# Patient Record
Sex: Male | Born: 1950 | Race: White | Hispanic: No | Marital: Married | Smoking: Former smoker
Health system: Southern US, Community
[De-identification: ages and names within clinical notes are randomized; demographics above are authoritative.]

## PROBLEM LIST (undated history)

## (undated) DIAGNOSIS — D649 Anemia, unspecified: Secondary | ICD-10-CM

## (undated) DIAGNOSIS — Z7189 Other specified counseling: Secondary | ICD-10-CM

## (undated) DIAGNOSIS — I4819 Other persistent atrial fibrillation: Secondary | ICD-10-CM

## (undated) DIAGNOSIS — I10 Essential (primary) hypertension: Secondary | ICD-10-CM

## (undated) DIAGNOSIS — R739 Hyperglycemia, unspecified: Secondary | ICD-10-CM

## (undated) DIAGNOSIS — R0600 Dyspnea, unspecified: Secondary | ICD-10-CM

## (undated) DIAGNOSIS — Z8546 Personal history of malignant neoplasm of prostate: Secondary | ICD-10-CM

## (undated) DIAGNOSIS — D494 Neoplasm of unspecified behavior of bladder: Secondary | ICD-10-CM

## (undated) DIAGNOSIS — Z973 Presence of spectacles and contact lenses: Secondary | ICD-10-CM

## (undated) DIAGNOSIS — C679 Malignant neoplasm of bladder, unspecified: Secondary | ICD-10-CM

## (undated) DIAGNOSIS — E785 Hyperlipidemia, unspecified: Secondary | ICD-10-CM

## (undated) DIAGNOSIS — R7303 Prediabetes: Secondary | ICD-10-CM

## (undated) DIAGNOSIS — M1990 Unspecified osteoarthritis, unspecified site: Secondary | ICD-10-CM

## (undated) DIAGNOSIS — R399 Unspecified symptoms and signs involving the genitourinary system: Secondary | ICD-10-CM

## (undated) DIAGNOSIS — C772 Secondary and unspecified malignant neoplasm of intra-abdominal lymph nodes: Secondary | ICD-10-CM

## (undated) DIAGNOSIS — I499 Cardiac arrhythmia, unspecified: Secondary | ICD-10-CM

## (undated) DIAGNOSIS — N189 Chronic kidney disease, unspecified: Secondary | ICD-10-CM

## (undated) HISTORY — PX: LUMBAR SPINE SURGERY: SHX701

## (undated) HISTORY — DX: Malignant neoplasm of bladder, unspecified: C67.9

## (undated) HISTORY — DX: Other persistent atrial fibrillation: I48.19

## (undated) HISTORY — PX: TOTAL KNEE ARTHROPLASTY: SHX125

## (undated) HISTORY — PX: KNEE ARTHROSCOPY: SUR90

## (undated) HISTORY — DX: Chronic kidney disease, unspecified: N18.9

## (undated) HISTORY — DX: Secondary and unspecified malignant neoplasm of intra-abdominal lymph nodes: C77.2

## (undated) HISTORY — DX: Other specified counseling: Z71.89

---
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
## 1995-04-13 HISTORY — PX: CERVICAL SPINE SURGERY: SHX589

## 1997-09-27 ENCOUNTER — Ambulatory Visit (HOSPITAL_COMMUNITY): Admission: RE | Admit: 1997-09-27 | Discharge: 1997-09-28 | Payer: Self-pay | Admitting: Neurosurgery

## 2002-02-27 ENCOUNTER — Encounter: Admission: RE | Admit: 2002-02-27 | Discharge: 2002-02-27 | Payer: Self-pay | Admitting: Neurosurgery

## 2002-02-27 ENCOUNTER — Encounter: Payer: Self-pay | Admitting: Neurosurgery

## 2002-03-16 ENCOUNTER — Encounter: Payer: Self-pay | Admitting: Neurosurgery

## 2002-03-16 ENCOUNTER — Encounter: Admission: RE | Admit: 2002-03-16 | Discharge: 2002-03-16 | Payer: Self-pay | Admitting: Neurosurgery

## 2002-04-04 ENCOUNTER — Encounter: Payer: Self-pay | Admitting: Neurosurgery

## 2002-04-04 ENCOUNTER — Encounter: Admission: RE | Admit: 2002-04-04 | Discharge: 2002-04-04 | Payer: Self-pay | Admitting: Neurosurgery

## 2006-02-02 ENCOUNTER — Ambulatory Visit (HOSPITAL_COMMUNITY): Admission: RE | Admit: 2006-02-02 | Discharge: 2006-02-02 | Payer: Self-pay | Admitting: Urology

## 2006-06-20 ENCOUNTER — Observation Stay (HOSPITAL_COMMUNITY): Admission: RE | Admit: 2006-06-20 | Discharge: 2006-06-21 | Payer: Self-pay | Admitting: Urology

## 2006-06-20 ENCOUNTER — Encounter (INDEPENDENT_AMBULATORY_CARE_PROVIDER_SITE_OTHER): Payer: Self-pay | Admitting: Specialist

## 2006-06-20 HISTORY — PX: ROBOT ASSISTED LAPAROSCOPIC RADICAL PROSTATECTOMY: SHX5141

## 2007-02-15 ENCOUNTER — Ambulatory Visit (HOSPITAL_BASED_OUTPATIENT_CLINIC_OR_DEPARTMENT_OTHER): Admission: RE | Admit: 2007-02-15 | Discharge: 2007-02-15 | Payer: Self-pay | Admitting: Orthopedic Surgery

## 2009-04-12 HISTORY — PX: CATARACT EXTRACTION W/ INTRAOCULAR LENS  IMPLANT, BILATERAL: SHX1307

## 2009-08-13 ENCOUNTER — Inpatient Hospital Stay (HOSPITAL_COMMUNITY): Admission: RE | Admit: 2009-08-13 | Discharge: 2009-08-15 | Payer: Self-pay | Admitting: Orthopedic Surgery

## 2009-12-26 ENCOUNTER — Inpatient Hospital Stay (HOSPITAL_COMMUNITY): Admission: RE | Admit: 2009-12-26 | Discharge: 2009-12-28 | Payer: Self-pay | Admitting: Orthopedic Surgery

## 2010-01-31 ENCOUNTER — Emergency Department (HOSPITAL_COMMUNITY): Admission: EM | Admit: 2010-01-31 | Discharge: 2010-01-31 | Payer: Self-pay | Admitting: Emergency Medicine

## 2010-06-24 LAB — LIPASE, BLOOD: Lipase: 21 U/L (ref 11–59)

## 2010-06-24 LAB — URINALYSIS, ROUTINE W REFLEX MICROSCOPIC
Bilirubin Urine: NEGATIVE
Glucose, UA: NEGATIVE mg/dL
Ketones, ur: NEGATIVE mg/dL
Leukocytes, UA: NEGATIVE
Nitrite: NEGATIVE
Protein, ur: NEGATIVE mg/dL
Specific Gravity, Urine: 1.023 (ref 1.005–1.030)
Urobilinogen, UA: 0.2 mg/dL (ref 0.0–1.0)
pH: 6.5 (ref 5.0–8.0)

## 2010-06-24 LAB — COMPREHENSIVE METABOLIC PANEL
ALT: 15 U/L (ref 0–53)
AST: 24 U/L (ref 0–37)
Albumin: 3.7 g/dL (ref 3.5–5.2)
Alkaline Phosphatase: 65 U/L (ref 39–117)
BUN: 20 mg/dL (ref 6–23)
CO2: 29 mEq/L (ref 19–32)
Calcium: 9.3 mg/dL (ref 8.4–10.5)
Chloride: 104 mEq/L (ref 96–112)
Creatinine, Ser: 1.3 mg/dL (ref 0.4–1.5)
GFR calc Af Amer: >60 mL/min (ref >60)
GFR calc non Af Amer: 57 mL/min — ABNORMAL LOW (ref >60)
Glucose, Bld: 129 mg/dL — ABNORMAL HIGH (ref 70–99)
Potassium: 4.1 mEq/L (ref 3.5–5.1)
Sodium: 141 mEq/L (ref 135–145)
Total Bilirubin: 0.7 mg/dL (ref 0.3–1.2)
Total Protein: 6.4 g/dL (ref 6.0–8.3)

## 2010-06-24 LAB — CBC
HCT: 37.3 % — ABNORMAL LOW (ref 39.0–52.0)
Hemoglobin: 12.1 g/dL — ABNORMAL LOW (ref 13.0–17.0)
MCH: 27 pg (ref 26.0–34.0)
MCHC: 32.4 g/dL (ref 30.0–36.0)
MCV: 83.3 fL (ref 78.0–100.0)
Platelets: 248 10*3/uL (ref 150–400)
RBC: 4.48 MIL/uL (ref 4.22–5.81)
RDW: 13.3 % (ref 11.5–15.5)
WBC: 10.7 10*3/uL — ABNORMAL HIGH (ref 4.0–10.5)

## 2010-06-24 LAB — DIFFERENTIAL
Basophils Absolute: 0 10*3/uL (ref 0.0–0.1)
Basophils Relative: 0 % (ref 0–1)
Eosinophils Absolute: 0 10*3/uL (ref 0.0–0.7)
Eosinophils Relative: 0 % (ref 0–5)
Lymphocytes Relative: 11 % — ABNORMAL LOW (ref 12–46)
Lymphs Abs: 1.2 10*3/uL (ref 0.7–4.0)
Monocytes Absolute: 0.6 10*3/uL (ref 0.1–1.0)
Monocytes Relative: 6 % (ref 3–12)
Neutro Abs: 8.8 10*3/uL — ABNORMAL HIGH (ref 1.7–7.7)
Neutrophils Relative %: 83 % — ABNORMAL HIGH (ref 43–77)

## 2010-06-24 LAB — PROTIME-INR
INR: 0.96 (ref 0.00–1.49)
Prothrombin Time: 13 seconds (ref 11.6–15.2)

## 2010-06-24 LAB — URINE MICROSCOPIC-ADD ON

## 2010-06-25 LAB — URINE MICROSCOPIC-ADD ON

## 2010-06-25 LAB — CBC
HCT: 28.9 % — ABNORMAL LOW (ref 39.0–52.0)
HCT: 37 % — ABNORMAL LOW (ref 39.0–52.0)
HCT: 48.1 % (ref 39.0–52.0)
Hemoglobin: 11.8 g/dL — ABNORMAL LOW (ref 13.0–17.0)
Hemoglobin: 9.4 g/dL — ABNORMAL LOW (ref 13.0–17.0)
Hemoglobin: 16.2 g/dL (ref 13.0–17.0)
MCH: 27.2 pg (ref 26.0–34.0)
MCH: 27.6 pg (ref 26.0–34.0)
MCH: 28 pg (ref 26.0–34.0)
MCHC: 31.9 g/dL (ref 30.0–36.0)
MCHC: 32.5 g/dL (ref 30.0–36.0)
MCHC: 33.7 g/dL (ref 30.0–36.0)
MCV: 83.5 fL (ref 78.0–100.0)
MCV: 86.7 fL (ref 78.0–100.0)
MCV: 83.2 fL (ref 78.0–100.0)
Platelets: 156 10*3/uL (ref 150–400)
Platelets: 228 10*3/uL (ref 150–400)
Platelets: 205 10*3/uL (ref 150–400)
RBC: 3.46 MIL/uL — ABNORMAL LOW (ref 4.22–5.81)
RBC: 4.27 MIL/uL (ref 4.22–5.81)
RBC: 5.78 MIL/uL (ref 4.22–5.81)
RDW: 13.9 % (ref 11.5–15.5)
RDW: 14.2 % (ref 11.5–15.5)
RDW: 13.4 % (ref 11.5–15.5)
WBC: 10.4 10*3/uL (ref 4.0–10.5)
WBC: 13 10*3/uL — ABNORMAL HIGH (ref 4.0–10.5)
WBC: 8.8 10*3/uL (ref 4.0–10.5)

## 2010-06-25 LAB — BASIC METABOLIC PANEL
BUN: 13 mg/dL (ref 6–23)
BUN: 25 mg/dL — ABNORMAL HIGH (ref 6–23)
CO2: 30 mEq/L (ref 19–32)
CO2: 32 mEq/L (ref 19–32)
Calcium: 7.9 mg/dL — ABNORMAL LOW (ref 8.4–10.5)
Calcium: 8.4 mg/dL (ref 8.4–10.5)
Chloride: 100 mEq/L (ref 96–112)
Chloride: 99 mEq/L (ref 96–112)
Creatinine, Ser: 0.96 mg/dL (ref 0.4–1.5)
Creatinine, Ser: 2.97 mg/dL — ABNORMAL HIGH (ref 0.4–1.5)
GFR calc Af Amer: 26 mL/min — ABNORMAL LOW (ref >60)
GFR calc Af Amer: >60 mL/min (ref >60)
GFR calc non Af Amer: 22 mL/min — ABNORMAL LOW (ref >60)
GFR calc non Af Amer: >60 mL/min (ref >60)
Glucose, Bld: 153 mg/dL — ABNORMAL HIGH (ref 70–99)
Glucose, Bld: 162 mg/dL — ABNORMAL HIGH (ref 70–99)
Potassium: 3.9 mEq/L (ref 3.5–5.1)
Potassium: 4.7 mEq/L (ref 3.5–5.1)
Sodium: 135 mEq/L (ref 135–145)
Sodium: 136 mEq/L (ref 135–145)

## 2010-06-25 LAB — COMPREHENSIVE METABOLIC PANEL
ALT: 19 U/L (ref 0–53)
AST: 26 U/L (ref 0–37)
Albumin: 4.3 g/dL (ref 3.5–5.2)
Alkaline Phosphatase: 73 U/L (ref 39–117)
BUN: 18 mg/dL (ref 6–23)
CO2: 31 mEq/L (ref 19–32)
Calcium: 9.5 mg/dL (ref 8.4–10.5)
Chloride: 104 mEq/L (ref 96–112)
Creatinine, Ser: 1.02 mg/dL (ref 0.4–1.5)
GFR calc Af Amer: >60 mL/min (ref >60)
GFR calc non Af Amer: >60 mL/min (ref >60)
Glucose, Bld: 101 mg/dL — ABNORMAL HIGH (ref 70–99)
Potassium: 4.9 mEq/L (ref 3.5–5.1)
Sodium: 140 mEq/L (ref 135–145)
Total Bilirubin: 0.9 mg/dL (ref 0.3–1.2)
Total Protein: 6.9 g/dL (ref 6.0–8.3)

## 2010-06-25 LAB — DIFFERENTIAL
Basophils Absolute: 0.1 10*3/uL (ref 0.0–0.1)
Basophils Relative: 1 % (ref 0–1)
Eosinophils Absolute: 0.2 10*3/uL (ref 0.0–0.7)
Eosinophils Relative: 2 % (ref 0–5)
Lymphocytes Relative: 28 % (ref 12–46)
Lymphs Abs: 2.4 10*3/uL (ref 0.7–4.0)
Monocytes Absolute: 0.6 10*3/uL (ref 0.1–1.0)
Monocytes Relative: 7 % (ref 3–12)
Neutro Abs: 5.5 10*3/uL (ref 1.7–7.7)
Neutrophils Relative %: 62 % (ref 43–77)

## 2010-06-25 LAB — URINALYSIS, ROUTINE W REFLEX MICROSCOPIC
Bilirubin Urine: NEGATIVE
Glucose, UA: NEGATIVE mg/dL
Hgb urine dipstick: NEGATIVE
Ketones, ur: NEGATIVE mg/dL
Nitrite: NEGATIVE
Protein, ur: NEGATIVE mg/dL
Specific Gravity, Urine: 1.024 (ref 1.005–1.030)
Urobilinogen, UA: 0.2 mg/dL (ref 0.0–1.0)
pH: 6 (ref 5.0–8.0)

## 2010-06-25 LAB — APTT: aPTT: 28 seconds (ref 24–37)

## 2010-06-25 LAB — TYPE AND SCREEN
ABO/RH(D): A POS
Antibody Screen: NEGATIVE

## 2010-06-25 LAB — SURGICAL PCR SCREEN
MRSA, PCR: NEGATIVE
Staphylococcus aureus: NEGATIVE

## 2010-06-25 LAB — PROTIME-INR
INR: 1.09 (ref 0.00–1.49)
INR: 1.31 (ref 0.00–1.49)
INR: 0.97 (ref 0.00–1.49)
Prothrombin Time: 14.3 seconds (ref 11.6–15.2)
Prothrombin Time: 16.5 seconds — ABNORMAL HIGH (ref 11.6–15.2)
Prothrombin Time: 13.1 seconds (ref 11.6–15.2)

## 2010-06-25 LAB — CK: Total CK: 93 U/L (ref 7–232)

## 2010-06-30 LAB — DIFFERENTIAL
Basophils Absolute: 0.1 10*3/uL (ref 0.0–0.1)
Basophils Relative: 1 % (ref 0–1)
Eosinophils Absolute: 0.2 10*3/uL (ref 0.0–0.7)
Eosinophils Relative: 3 % (ref 0–5)
Lymphocytes Relative: 32 % (ref 12–46)
Lymphs Abs: 2.4 10*3/uL (ref 0.7–4.0)
Monocytes Absolute: 0.7 10*3/uL (ref 0.1–1.0)
Monocytes Relative: 10 % (ref 3–12)
Neutro Abs: 4.2 10*3/uL (ref 1.7–7.7)
Neutrophils Relative %: 55 % (ref 43–77)

## 2010-06-30 LAB — CBC
HCT: 28.8 % — ABNORMAL LOW (ref 39.0–52.0)
HCT: 32.9 % — ABNORMAL LOW (ref 39.0–52.0)
HCT: 44.4 % (ref 39.0–52.0)
Hemoglobin: 11.6 g/dL — ABNORMAL LOW (ref 13.0–17.0)
Hemoglobin: 9.9 g/dL — ABNORMAL LOW (ref 13.0–17.0)
Hemoglobin: 15.3 g/dL (ref 13.0–17.0)
MCHC: 34.2 g/dL (ref 30.0–36.0)
MCHC: 35.2 g/dL (ref 30.0–36.0)
MCHC: 34.5 g/dL (ref 30.0–36.0)
MCV: 88.1 fL (ref 78.0–100.0)
MCV: 88.2 fL (ref 78.0–100.0)
MCV: 88.4 fL (ref 78.0–100.0)
Platelets: 147 10*3/uL — ABNORMAL LOW (ref 150–400)
Platelets: 187 10*3/uL (ref 150–400)
Platelets: 211 10*3/uL (ref 150–400)
RBC: 3.27 MIL/uL — ABNORMAL LOW (ref 4.22–5.81)
RBC: 3.74 MIL/uL — ABNORMAL LOW (ref 4.22–5.81)
RBC: 5.03 MIL/uL (ref 4.22–5.81)
RDW: 12.6 % (ref 11.5–15.5)
RDW: 13 % (ref 11.5–15.5)
RDW: 12.9 % (ref 11.5–15.5)
WBC: 12.1 10*3/uL — ABNORMAL HIGH (ref 4.0–10.5)
WBC: 12.8 10*3/uL — ABNORMAL HIGH (ref 4.0–10.5)
WBC: 7.6 10*3/uL (ref 4.0–10.5)

## 2010-06-30 LAB — COMPREHENSIVE METABOLIC PANEL
ALT: 18 U/L (ref 0–53)
AST: 25 U/L (ref 0–37)
Albumin: 4 g/dL (ref 3.5–5.2)
Alkaline Phosphatase: 69 U/L (ref 39–117)
BUN: 18 mg/dL (ref 6–23)
CO2: 31 mEq/L (ref 19–32)
Calcium: 9.1 mg/dL (ref 8.4–10.5)
Chloride: 104 mEq/L (ref 96–112)
Creatinine, Ser: 0.95 mg/dL (ref 0.4–1.5)
GFR calc Af Amer: >60 mL/min (ref >60)
GFR calc non Af Amer: >60 mL/min (ref >60)
Glucose, Bld: 104 mg/dL — ABNORMAL HIGH (ref 70–99)
Potassium: 4.8 mEq/L (ref 3.5–5.1)
Sodium: 140 mEq/L (ref 135–145)
Total Bilirubin: 1.2 mg/dL (ref 0.3–1.2)
Total Protein: 6.4 g/dL (ref 6.0–8.3)

## 2010-06-30 LAB — BASIC METABOLIC PANEL
BUN: 10 mg/dL (ref 6–23)
BUN: 24 mg/dL — ABNORMAL HIGH (ref 6–23)
CO2: 31 mEq/L (ref 19–32)
CO2: 32 mEq/L (ref 19–32)
Calcium: 7.7 mg/dL — ABNORMAL LOW (ref 8.4–10.5)
Calcium: 7.9 mg/dL — ABNORMAL LOW (ref 8.4–10.5)
Chloride: 101 mEq/L (ref 96–112)
Chloride: 96 mEq/L (ref 96–112)
Creatinine, Ser: 0.8 mg/dL (ref 0.4–1.5)
Creatinine, Ser: 1.58 mg/dL — ABNORMAL HIGH (ref 0.4–1.5)
GFR calc Af Amer: 55 mL/min — ABNORMAL LOW (ref >60)
GFR calc Af Amer: >60 mL/min (ref >60)
GFR calc non Af Amer: 45 mL/min — ABNORMAL LOW (ref >60)
GFR calc non Af Amer: >60 mL/min (ref >60)
Glucose, Bld: 141 mg/dL — ABNORMAL HIGH (ref 70–99)
Glucose, Bld: 143 mg/dL — ABNORMAL HIGH (ref 70–99)
Potassium: 4 mEq/L (ref 3.5–5.1)
Potassium: 4 mEq/L (ref 3.5–5.1)
Sodium: 132 mEq/L — ABNORMAL LOW (ref 135–145)
Sodium: 136 mEq/L (ref 135–145)

## 2010-06-30 LAB — PROTIME-INR
INR: 1.16 (ref 0.00–1.49)
INR: 1.54 — ABNORMAL HIGH (ref 0.00–1.49)
INR: 1.06 (ref 0.00–1.49)
Prothrombin Time: 14.7 seconds (ref 11.6–15.2)
Prothrombin Time: 18.4 seconds — ABNORMAL HIGH (ref 11.6–15.2)
Prothrombin Time: 13.7 seconds (ref 11.6–15.2)

## 2010-06-30 LAB — APTT: aPTT: 29 seconds (ref 24–37)

## 2010-06-30 LAB — TYPE AND SCREEN
ABO/RH(D): A POS
Antibody Screen: NEGATIVE

## 2010-06-30 LAB — URINALYSIS, ROUTINE W REFLEX MICROSCOPIC
Bilirubin Urine: NEGATIVE
Glucose, UA: NEGATIVE mg/dL
Hgb urine dipstick: NEGATIVE
Ketones, ur: NEGATIVE mg/dL
Nitrite: NEGATIVE
Protein, ur: NEGATIVE mg/dL
Specific Gravity, Urine: 1.012 (ref 1.005–1.030)
Urobilinogen, UA: 0.2 mg/dL (ref 0.0–1.0)
pH: 6.5 (ref 5.0–8.0)

## 2010-06-30 LAB — ABO/RH: ABO/RH(D): A POS

## 2010-08-25 NOTE — Op Note (Signed)
NAME:  [name] Rose, [name] Rose              ACCOUNT NO.:  [id number]   MEDICAL RECORD NO.:  [id number]          PATIENT TYPE:  AMB   LOCATION:  DSC                          FACILITY:  [facility]   PHYSICIAN:  Alta Corning, M.D.   DATE OF BIRTH:  Dec 24, 1950   DATE OF PROCEDURE:  02/15/2007  DATE OF DISCHARGE:                               OPERATIVE REPORT   PREOPERATIVE DIAGNOSIS:  Medial meniscal tear with tricompartmental  degenerative change   POSTOPERATIVE DIAGNOSIS:  Medial meniscal tear with tricompartmental  degenerative change   PRINCIPAL PROCEDURES:  1. Partial posterior horn medial meniscectomy with corresponding      debridement of the articular cartilage in the medial compartment.  2. Chondroplasty lateral femoral compartment down to bleeding bone.  3. Chondroplasty of patellofemoral compartment down to bone.   SURGEON:  Alta Corning, M.D.   ASSISTANT:  Gary Fleet, P.A.   ANESTHESIA:  General.   BRIEF HISTORY:  Mr. Taras is a 60 year old male with a long history  of having had significant knee pain.  He had an MRI which showed that he  had a medial meniscal tear.  Plain x-rays showed that he had some  significant tricompartmental degenerative change.  We talked about  treatment options but ultimately felt that arthroscopic intervention  should return him to that preoperative level of ache with ridding him of  his significant catching and locking pain, and after discussion of this,  he was brought to the operating room for this procedure.   PROCEDURE:  The patient is brought to the operating room.  After  adequate anesthesia was obtained with general anesthetic, the patient  was placed up on the operating room table.  The left leg was prepped and  draped in the usual sterile fashion.  Following this, routine  arthroscopic examination revealed that there was an obvious  chondromalacia of the patellofemoral joint.  This was debrided back to a  smooth and stable rim of  articular cartilage down to bone.   Attention was turned to the medial compartment where there was noted to  be a posterior horn medial meniscus with a flap that was flapped up  posteriorly.  This was debrided and once this was debrided, attention  was turned to the medial femoral condyle where the medial femoral  condyle was debrided down to a smooth and stable rim.  Once that was  completed, attention was turned towards the ACL, which was normal.  Lateral side showed some grade 2 and 3 chondral wear which was debrided  centrally.  Lateral meniscus was within normal limits.  Once this had  been completed, attention was then turned to the knee which was  copiously and thoroughly irrigated with 6 liters of  normal saline irrigation.  Once this was completed, the knee was  suctioned dry.  The wounds were closed with a bandage.  A sterile  compression dressing was applied.  The patient was taken to the recovery  room, was noted to be in satisfactory condition.  Estimated blood loss  through procedure was nothing.      Alta Corning,  M.D.  Electronically Signed     JLG/MEDQ  D:  02/15/2007  T:  02/15/2007  Job:  [id number]   cc:   Alta Corning, M.D.

## 2010-08-28 NOTE — Op Note (Signed)
NAME:  [name] Rose, [name] Rose NO.:  [id number]   MEDICAL RECORD NO.:  [id number]          PATIENT TYPE:  INP   LOCATION:  0001                         FACILITY:  [facility]   PHYSICIAN:  Raynelle Bring, MD      DATE OF BIRTH:  Sep 06, 1950   DATE OF PROCEDURE:  06/20/2006  DATE OF DISCHARGE:                               OPERATIVE REPORT   PREOPERATIVE DIAGNOSIS:  Clinically localized adenocarcinoma of the  prostate.   POSTOPERATIVE DIAGNOSIS:  Clinically localized adenocarcinoma of the  prostate.   PROCEDURE:  Robotic assisted laparoscopic radical prostatectomy  (bilateral nerve sparing).   SURGEON:  Dr. Raynelle Bring.   ASSISTANT:  Dr. Nelida Gores.   ANESTHESIA:  General.   COMPLICATIONS:  None.   ESTIMATED BLOOD LOSS:  300 mL.   INTRAVENOUS FLUIDS:  1900 mL of lactated Ringer's.   SPECIMENS:  Prostate and seminal vesicles.   DISPOSITION AND PATHOLOGY:  Specimens to pathology.   DRAINS:  1. 20-French coude' catheter.  2. #19 Blake pelvic drain.   INDICATIONS:  [name] Rose is a 60 year old gentleman with clinical  stage T1C prostate cancer with a PSA of 4.58 and Gleason score of 3+3=6.  Pretreatment IPSS was 25 with an IIEF score of 11.  After discussing  management options for clinically localized prostate cancer, he elected  to proceed with surgical therapy and the above procedure.  The potential  risks and benefits were discussed with the patient and he consented.   DESCRIPTION OF PROCEDURE:  The patient was taken to the operating room  and a general anesthetic was administered.  He was given preoperative  antibiotics, placed in the dorsal lithotomy position, and prepped and  draped in the usual sterile fashion.  A preoperative time-out was then  performed.  A Foley catheter was inserted into the bladder.  A site was  then selected approximately 18 cm from the pubic symphysis and just to  the left of the umbilicus for placement of the camera port.   This was  placed using a standard open Hassan technique.  This allowed entry into  the peritoneal cavity and under direct vision.  A 12-mm port was then  placed and a pneumoperitoneum was established.  A zero-degree lens was  then used to inspect the abdomen and there was no evidence of any intra-  abdominal injuries or other abnormalities.  The remaining ports were  then placed.  Bilateral 8-mm robotic ports were placed 16 cm from the  pubic symphysis and 10 cm lateral to the camera port.  An additional 8-  mm robotic port was placed in the far left lateral abdominal wall.  A 5-  mm port was placed between the camera port and the right robotic port.  An additional 12-mm port was placed in the far right lateral abdominal  wall for laparoscopic assistance.  All ports were placed under direct  vision without difficulty.  The surgical cart was then docked.  With the  aid of the cautery scissors, the bladder was reflected posteriorly  allowing entry into the space of Retzius and identification of the  endopelvic fascia and prostate.  The endopelvic fascia was then incised  from the apex back to the base of the prostate bilaterally and the  underlying levator muscle fibers were swept laterally off the prostate.  This isolated the dorsal venous complex which was then stapled and  divided with a 45-mm flex ETS stapler.  The bladder neck was then  identified with the aid of Foley catheter manipulation.  The bladder  neck was incised anteriorly.  On incision into the bladder, the patient  was noted to have extremely enlarged lateral lobes bilaterally.  No  median lobe was present .  Indigo carmine was administered to help  identify the ureteral orifices.  The bladder neck was then divided  laterally and posteriorly and the dissection continued posteriorly  between the bladder neck and prostate until the vasa deferentia and  seminal vesicles were identified. The vasa deferentia were then isolated   and divided.  The seminal vesicles were dissected free down to their  tips with care to control the seminal vesicle arterial blood supply.  The seminal vesicles were then also lifted anteriorly.  The space  between Denonvilliers fascia and the anterior rectum was then bluntly  developed thereby isolating the vascular pedicles of the prostate.  The  lateral prostatic fascia was then incised bilaterally allowing the  neurovascular bundles to be swept laterally and posteriorly off the  prostate.  The vascular pedicles of the prostate were then ligated with  Hem-o-Lok clips above the level of the neurovascular bundles and divided  with sharp cold scissor dissection.  The neurovascular bundles were then  swept off the apex of the prostate and urethra.  The prostate was then  able to be disarticulated after the urethra was sharply divided.  The  pelvis was then examined.  There was noted be some bleeding from the  distal left neurovascular bundle.  This was carefully oversewn with a  figure-of-eight 3-0 Vicryl suture.  The pelvis was then copiously  irrigated and with irrigation in the pelvis, air was injected into the  rectal catheter.  There was no evidence of a rectal injury.  Attention  then turned to the bladder neck.  Due to the large prostate, the bladder  neck did require extensive reconstruction.  This was performed at the 5  and 7 o'clock positions bilaterally with 3-0 Vicryl suture with care to  identify and protect the ureteral orifices.  Attention then turned to  the urethral anastomosis.  A 2-0 Vicryl slip-knot was placed at the 6  o'clock position between the bladder neck and urethra to reapproximate  these structures.  A double-armed 3-0 Monocryl suture was then used to  perform a 360 degree running tension-free anastomosis between the  bladder neck and urethra.  A 20-French coude' catheter was then inserted into the bladder and irrigated.  There were no blood clots within the   bladder and the catheter appeared to irrigate well.  This was irrigated.  There no blood clots within the bladder.  There was noted to be some  leakage from the bladder neck repair on the right side.  Therefore a  single interrupted 3-0 Monocryl suture was placed at this position.  Again the catheter was irrigated and at this time the anastomosis  appeared to be watertight.  A #19 Blake drain was then brought through  the left robotic port and appropriately positioned in the pelvis.  It  was secured to the skin with a nylon suture.  The surgical  cart was then  undocked.  The prostate was placed into the Endopouch retrieval bag for  later removal.  The right lateral 12-mm port site was then closed with a  #0 Vicryl suture placed with the aid of the suture passer device.  The  prostate specimen was then removed intact within the Endopouch retrieval  bag via the periumbilical port site.  This fascial opening was then  closed with a running #0 Vicryl suture.  All ports were then injected  with 0.25% Marcaine and reapproximated to the skin level with staples.  Sterile dressings were applied.  The patient appeared to tolerate  procedure well and without complications.  He was able to be extubated  and transferred to the recovery unit in satisfactory condition.           ______________________________  Raynelle Bring, MD  Electronically Signed     LB/MEDQ  D:  06/20/2006  T:  06/20/2006  Job:  [id number]

## 2010-08-28 NOTE — H&P (Signed)
NAME:  [name] Rose, GURA NO.:  [id number]   MEDICAL RECORD NO.:  [id number]          PATIENT TYPE:  INP   LOCATION:  0001                         FACILITY:  [facility]   PHYSICIAN:  Raynelle Bring, MD      DATE OF BIRTH:  Jun 21, 1950   DATE OF ADMISSION:  06/20/2006  DATE OF DISCHARGE:                              HISTORY & PHYSICAL   CHIEF COMPLAINT:  Prostate cancer.   HISTORY:  [name] is a 60 year old gentleman with clinical stage  T1C prostate cancer with a PSA of 4.58 and Gleason score of 3 + 3 = 6.  His prostate biopsy was performed on 01/19/2006 with 10% of the right-  sided biopsy specimens involved in 1/9 cores on the right side.  Prostate volume was measured at 99 mL.  The patient was also noted have  significant voiding symptoms with an AUA symptom score of 25.  He  underwent a CT scan and bone scan by Dr. Reece Agar which were negative  for metastatic disease.  After discussing management options for  clinically localized prostate cancer, the patient elected to proceed  with surgical therapy and a robotic-assisted laparoscopic approach.   PAST MEDICAL HISTORY:  1. Hypertension.  2. Urolithiasis.  3. 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   PAST SURGICAL HISTORY:  1. Lumbar spine surgery in 1983, 1990, and in 1999.  2. Cervical spine surgery in 1997.  3. Arthroscopic right knee surgery in 2006.   MEDICATIONS:  1. Naprosyn  2. Doxazosin.  3. Cymbalta.  4. Lisinopril.  5. Hydrochlorothiazide.  6. Bisoprolol.   ALLERGIES:  No known drug allergies.   FAMILY HISTORY:  1. Hypertension.  2. Diabetes.  3. COPD from which the patient's father died at age 72.  The patient's      mother died at age 19 related to her congestive heart failure.      There is no history of prostate cancer in the family.   SOCIAL HISTORY:  The patient works in the [employer].  He had did  smoke but quit in 1986.  He drinks 4-5  glass of alcohol per week.   PHYSICAL EXAMINATION:  CONSTITUTIONAL:  Alert and oriented and in no  acute distress.  CARDIOVASCULAR:  Regular rate and rhythm without obvious murmurs.  LUNGS:  Clear bilaterally.  ABDOMEN:  Mildly obese but soft, nontender, nondistended without  abdominal masses.   IMPRESSION:  Low-risk clinically localized prostate cancer.   PLAN:  Mr. Alverio will undergo a robotic-assisted laparoscopic  radical prostatectomy; and then be admitted to the hospital for routine  postoperative care.           ______________________________  Raynelle Bring, MD  Electronically Signed     LB/MEDQ  D:  06/20/2006  T:  06/20/2006  Job:  [id number]

## 2010-08-28 NOTE — Discharge Summary (Signed)
NAME:  [name] Rose, [name] Rose NO.:  [id number]   MEDICAL RECORD NO.:  [id number]          PATIENT TYPE:  INP   LOCATION:  1430                         [facility]ACILITY:  [facility]   PHYSICIAN:  Raynelle Bring, MD      DATE OF BIRTH:  1950-12-17   DATE OF ADMISSION:  06/20/2006  DATE OF DISCHARGE:  06/21/2006                               DISCHARGE SUMMARY   ADMISSION DIAGNOSIS:  Prostate cancer.   DISCHARGE DIAGNOSIS:  Prostate cancer.   PROCEDURE:  Robotic-assisted laparoscopic radical prostatectomy.   HISTORY:  [name] Rose is a 60 year old gentleman with clinical stage  T1C prostate cancer with a PSA of 4.58 and Gleason score of 3+3 = 6.  After discussing management options for clinically localized prostate  cancer, he elected to proceed with surgical therapy and the above  procedure.   HOSPITAL COURSE:  On June 20, 2006, the patient was taken to the  operating room and underwent a robotic-assisted laparoscopic radical  prostatectomy.  He tolerated this procedure well and without  complications.  Postoperatively, he was able to be transferred to a  regular hospital room following recovery from anesthesia.  He was  gradually able to be advanced to a regular diet over the next 24 hours.  He maintained excellent urine minimal output from his pelvic drain.  His  hemoglobin was monitored and remained stable at 11.9 on the morning  after surgery.  His pelvic drain was removed and once tolerating a clear  liquid diet, he was transitioned to oral pain medication.  He continued  to ambulate without difficulty and by the afternoon of postoperative day  #1, was able to be discharged home in excellent condition.   DISPOSITION:  Home.   DISCHARGE MEDICATIONS:  The patient was instructed to resume his regular  home medications excepting any aspirin, nonsteroidal anti-inflammatory  drugs, or herbal supplements.  He was also instructed to stop his  doxazosin.  He was given a prescription  to take Darvocet as needed for  pain and Colace as a stool softener.  He was also given a prescription  to begin Cipro 1 day prior to his return visit for Foley catheter  removal.   DISCHARGE INSTRUCTIONS:  The patient was instructed to be ambulatory but  specifically told to refrain from any heavy lifting, strenuous activity,  or driving.  He was told to gradually advance his diet once passing  flatus.  He was also instructed on routine Foley catheter care and given  a leg bag for daytime usage.   FOLLOW UP:  [name] Rose will plan to follow up in 1 week with a  cystogram prior to Foley catheter removal.           ______________________________  Raynelle Bring, MD  Electronically Signed    LB/MEDQ  D:  06/21/2006  T:  06/23/2006  Job:  [id number]

## 2011-01-19 LAB — I-STAT 8, (EC8 V) (CONVERTED LAB)
Acid-Base Excess: 4 — ABNORMAL HIGH
BUN: 25 — ABNORMAL HIGH
Bicarbonate: 29.1 — ABNORMAL HIGH
Chloride: 104
Glucose, Bld: 108 — ABNORMAL HIGH
HCT: 46
Hemoglobin: 15.6
Operator id: 128471
Potassium: 4.3
Sodium: 138
TCO2: 30
pCO2, Ven: 43 — ABNORMAL LOW
pH, Ven: 7.438 — ABNORMAL HIGH

## 2013-08-01 ENCOUNTER — Other Ambulatory Visit: Payer: Self-pay | Admitting: Gastroenterology

## 2014-04-18 ENCOUNTER — Other Ambulatory Visit: Payer: Self-pay | Admitting: Family Medicine

## 2014-04-18 DIAGNOSIS — M79605 Pain in left leg: Secondary | ICD-10-CM

## 2014-04-18 DIAGNOSIS — M545 Low back pain: Secondary | ICD-10-CM

## 2014-07-26 ENCOUNTER — Other Ambulatory Visit: Payer: Self-pay | Admitting: Physical Medicine and Rehabilitation

## 2014-07-26 DIAGNOSIS — M1612 Unilateral primary osteoarthritis, left hip: Secondary | ICD-10-CM

## 2014-08-26 ENCOUNTER — Ambulatory Visit
Admission: RE | Admit: 2014-08-26 | Discharge: 2014-08-26 | Disposition: A | Discharge status: Home / Self Care | Payer: 59 | Source: Ambulatory Visit | Attending: Physical Medicine and Rehabilitation | Admitting: Physical Medicine and Rehabilitation

## 2014-08-26 DIAGNOSIS — M1612 Unilateral primary osteoarthritis, left hip: Secondary | ICD-10-CM

## 2014-08-26 MED ORDER — IOHEXOL 180 MG/ML  SOLN
15.0000 mL | Freq: Once | INTRAMUSCULAR | Status: AC | PRN
  Administered 2014-08-26: 15 mL via INTRA_ARTICULAR

## 2015-02-18 ENCOUNTER — Other Ambulatory Visit: Payer: Self-pay | Admitting: Orthopedic Surgery

## 2015-02-25 ENCOUNTER — Other Ambulatory Visit (HOSPITAL_COMMUNITY): Payer: Self-pay | Admitting: *Deleted

## 2015-02-25 NOTE — Pre-Procedure Instructions (Addendum)
    [name] Rose  02/25/2015      [facility] [place], [address] [place] [place] [address] Phone: [phone] Fax: [phone]    Your procedure is scheduled on Monday, March 03, 2015 at 1:00 PM.  Report to [facility] Entrance "A" Admitting Office at 11:00 AM.  Call this number if you have problems the morning of surgery: [phone]  Any questions prior to day of surgery, please call [phone] between 8 & 4 PM.   Remember:  Do not eat food or drink liquids after midnight Sunday, 03/02/15.  Take these medicines the morning of surgery with A SIP OF WATER: Amlodipine (Norvasc), Bisoprolol-Hydrochlorothiazide (Ziac), Tylenol - if needed  Stop NSAIDS (Naprosyn, Aleve, Ibuprofen, etc.) as of today.   Do not wear jewelry.  Do not wear lotions, powders, or cologne.  You may not wear deodorant.  Men may shave face and neck.  Do not bring valuables to the hospital.  [facility] is not responsible for any belongings or valuables.  Contacts, dentures or bridgework may not be worn into surgery.  Leave your suitcase in the car.  After surgery it may be brought to your room.  For patients admitted to the hospital, discharge time will be determined by your treatment team.  Special instructions:  See "Preparing for Surgery" Instruction sheet.  Please read over the following fact sheets that you were given. Pain Booklet, Coughing and Deep Breathing, Blood Transfusion Information, MRSA Information and Surgical Site Infection Prevention

## 2015-02-26 ENCOUNTER — Encounter (HOSPITAL_COMMUNITY): Payer: Self-pay

## 2015-02-26 ENCOUNTER — Encounter (HOSPITAL_COMMUNITY)
Admission: RE | Admit: 2015-02-26 | Discharge: 2015-02-26 | Disposition: A | Discharge status: Home / Self Care | Payer: 59 | Source: Ambulatory Visit | Attending: Orthopedic Surgery | Admitting: Orthopedic Surgery

## 2015-02-26 DIAGNOSIS — M5134 Other intervertebral disc degeneration, thoracic region: Secondary | ICD-10-CM | POA: Diagnosis not present

## 2015-02-26 DIAGNOSIS — M1612 Unilateral primary osteoarthritis, left hip: Secondary | ICD-10-CM | POA: Insufficient documentation

## 2015-02-26 DIAGNOSIS — Z01812 Encounter for preprocedural laboratory examination: Secondary | ICD-10-CM | POA: Insufficient documentation

## 2015-02-26 DIAGNOSIS — Z01818 Encounter for other preprocedural examination: Secondary | ICD-10-CM

## 2015-02-26 DIAGNOSIS — I10 Essential (primary) hypertension: Secondary | ICD-10-CM | POA: Diagnosis not present

## 2015-02-26 DIAGNOSIS — I440 Atrioventricular block, first degree: Secondary | ICD-10-CM | POA: Diagnosis not present

## 2015-02-26 DIAGNOSIS — Z87891 Personal history of nicotine dependence: Secondary | ICD-10-CM | POA: Insufficient documentation

## 2015-02-26 DIAGNOSIS — Z0183 Encounter for blood typing: Secondary | ICD-10-CM | POA: Diagnosis not present

## 2015-02-26 HISTORY — DX: Unspecified osteoarthritis, unspecified site: M19.90

## 2015-02-26 HISTORY — DX: Essential (primary) hypertension: I10

## 2015-02-26 LAB — URINE MICROSCOPIC-ADD ON

## 2015-02-26 LAB — URINALYSIS, ROUTINE W REFLEX MICROSCOPIC
Bilirubin Urine: NEGATIVE
Glucose, UA: NEGATIVE mg/dL
Ketones, ur: NEGATIVE mg/dL
Leukocytes, UA: NEGATIVE
Nitrite: NEGATIVE
Protein, ur: NEGATIVE mg/dL
Specific Gravity, Urine: 1.022 (ref 1.005–1.030)
pH: 5.5 (ref 5.0–8.0)

## 2015-02-26 LAB — COMPREHENSIVE METABOLIC PANEL
ALT: 24 U/L (ref 17–63)
AST: 26 U/L (ref 15–41)
Albumin: 4.1 g/dL (ref 3.5–5.0)
Alkaline Phosphatase: 73 U/L (ref 38–126)
Anion gap: 7 (ref 5–15)
BUN: 15 mg/dL (ref 6–20)
CO2: 28 mmol/L (ref 22–32)
Calcium: 9.3 mg/dL (ref 8.9–10.3)
Chloride: 104 mmol/L (ref 101–111)
Creatinine, Ser: 0.94 mg/dL (ref 0.61–1.24)
GFR calc Af Amer: >60 mL/min (ref >60)
GFR calc non Af Amer: >60 mL/min (ref >60)
Glucose, Bld: 106 mg/dL — ABNORMAL HIGH (ref 65–99)
Potassium: 4.1 mmol/L (ref 3.5–5.1)
Sodium: 139 mmol/L (ref 135–145)
Total Bilirubin: 1 mg/dL (ref 0.3–1.2)
Total Protein: 6.5 g/dL (ref 6.5–8.1)

## 2015-02-26 LAB — SURGICAL PCR SCREEN
MRSA, PCR: NEGATIVE
Staphylococcus aureus: NEGATIVE

## 2015-02-26 LAB — APTT: aPTT: 29 seconds (ref 24–37)

## 2015-02-26 LAB — CBC WITH DIFFERENTIAL/PLATELET
Basophils Absolute: 0 10*3/uL (ref 0.0–0.1)
Basophils Relative: 1 %
Eosinophils Absolute: 0.2 10*3/uL (ref 0.0–0.7)
Eosinophils Relative: 3 %
HCT: 45 % (ref 39.0–52.0)
Hemoglobin: 15.4 g/dL (ref 13.0–17.0)
Lymphocytes Relative: 28 %
Lymphs Abs: 2.2 10*3/uL (ref 0.7–4.0)
MCH: 29.4 pg (ref 26.0–34.0)
MCHC: 34.2 g/dL (ref 30.0–36.0)
MCV: 85.9 fL (ref 78.0–100.0)
Monocytes Absolute: 0.8 10*3/uL (ref 0.1–1.0)
Monocytes Relative: 10 %
Neutro Abs: 4.8 10*3/uL (ref 1.7–7.7)
Neutrophils Relative %: 60 %
Platelets: 210 10*3/uL (ref 150–400)
RBC: 5.24 MIL/uL (ref 4.22–5.81)
RDW: 12.7 % (ref 11.5–15.5)
WBC: 8.1 10*3/uL (ref 4.0–10.5)

## 2015-02-26 LAB — PROTIME-INR
INR: 1.03 (ref 0.00–1.49)
Prothrombin Time: 13.7 seconds (ref 11.6–15.2)

## 2015-02-26 NOTE — Progress Notes (Signed)
   02/26/15 0924  OBSTRUCTIVE SLEEP APNEA  Have you ever been diagnosed with sleep apnea through a sleep study? No  Do you snore loudly (loud enough to be heard through closed doors)?  0  Do you often feel tired, fatigued, or sleepy during the daytime (such as falling asleep during driving or talking to someone)? 0  Has anyone observed you stop breathing during your sleep? 0  Do you have, or are you being treated for high blood pressure? 1  BMI more than 35 kg/m2? 1  Age > 50 (1-yes) 1  Neck circumference greater than:Male 16 inches or larger, Male 17inches or larger? 1  Male Gender (Yes=1) 1  Obstructive Sleep Apnea Score 5  Score 5 or greater  Results sent to PCP

## 2015-02-26 NOTE — Progress Notes (Signed)
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 Ago or longer:negative ,stated he had chest pain but found out it was pulled muscle, thought test was done at [facility] cardiology.

## 2015-02-28 MED ORDER — DEXTROSE 5 % IV SOLN
3.0000 g | INTRAVENOUS | Status: AC
  Administered 2015-03-03: 3 g via INTRAVENOUS
  Filled 2015-02-28 (×2): qty 3000

## 2015-02-28 MED ORDER — CHLORHEXIDINE GLUCONATE 4 % EX LIQD: 60.0000 mL | Freq: Once | CUTANEOUS | Status: DC

## 2015-03-03 ENCOUNTER — Inpatient Hospital Stay (HOSPITAL_COMMUNITY): Payer: 59

## 2015-03-03 ENCOUNTER — Inpatient Hospital Stay (HOSPITAL_COMMUNITY): Payer: 59 | Admitting: Emergency Medicine

## 2015-03-03 ENCOUNTER — Encounter (HOSPITAL_COMMUNITY)
Admission: AD | Disposition: A | Discharge status: Home Health | Payer: Self-pay | Source: Ambulatory Visit | Attending: Orthopedic Surgery

## 2015-03-03 ENCOUNTER — Inpatient Hospital Stay (HOSPITAL_COMMUNITY): Payer: 59 | Admitting: Certified Registered Nurse Anesthetist

## 2015-03-03 ENCOUNTER — Inpatient Hospital Stay (HOSPITAL_COMMUNITY)
Admission: AD | Admit: 2015-03-03 | Discharge: 2015-03-05 | DRG: 470 | Disposition: A | Discharge status: Home Health | Payer: 59 | Source: Ambulatory Visit | Attending: Orthopedic Surgery | Admitting: Orthopedic Surgery

## 2015-03-03 ENCOUNTER — Encounter (HOSPITAL_COMMUNITY): Payer: Self-pay | Admitting: *Deleted

## 2015-03-03 DIAGNOSIS — Z9842 Cataract extraction status, left eye: Secondary | ICD-10-CM

## 2015-03-03 DIAGNOSIS — I10 Essential (primary) hypertension: Secondary | ICD-10-CM | POA: Diagnosis present

## 2015-03-03 DIAGNOSIS — Z9841 Cataract extraction status, right eye: Secondary | ICD-10-CM

## 2015-03-03 DIAGNOSIS — Z87891 Personal history of nicotine dependence: Secondary | ICD-10-CM

## 2015-03-03 DIAGNOSIS — E876 Hypokalemia: Secondary | ICD-10-CM | POA: Diagnosis not present

## 2015-03-03 DIAGNOSIS — Z79899 Other long term (current) drug therapy: Secondary | ICD-10-CM

## 2015-03-03 DIAGNOSIS — Z791 Long term (current) use of non-steroidal anti-inflammatories (NSAID): Secondary | ICD-10-CM | POA: Diagnosis not present

## 2015-03-03 DIAGNOSIS — D62 Acute posthemorrhagic anemia: Secondary | ICD-10-CM | POA: Diagnosis not present

## 2015-03-03 DIAGNOSIS — M1612 Unilateral primary osteoarthritis, left hip: Secondary | ICD-10-CM | POA: Diagnosis present

## 2015-03-03 DIAGNOSIS — Z96649 Presence of unspecified artificial hip joint: Secondary | ICD-10-CM

## 2015-03-03 DIAGNOSIS — Z419 Encounter for procedure for purposes other than remedying health state, unspecified: Secondary | ICD-10-CM

## 2015-03-03 HISTORY — PX: TOTAL HIP ARTHROPLASTY: SHX124

## 2015-03-03 LAB — CBC
HCT: 32.7 % — ABNORMAL LOW (ref 39.0–52.0)
Hemoglobin: 11.1 g/dL — ABNORMAL LOW (ref 13.0–17.0)
MCH: 29.7 pg (ref 26.0–34.0)
MCHC: 33.9 g/dL (ref 30.0–36.0)
MCV: 87.4 fL (ref 78.0–100.0)
Platelets: 221 10*3/uL (ref 150–400)
RBC: 3.74 MIL/uL — ABNORMAL LOW (ref 4.22–5.81)
RDW: 13.1 % (ref 11.5–15.5)
WBC: 19.2 10*3/uL — ABNORMAL HIGH (ref 4.0–10.5)

## 2015-03-03 LAB — POCT I-STAT 4, (NA,K, GLUC, HGB,HCT)
Glucose, Bld: 163 mg/dL — ABNORMAL HIGH (ref 65–99)
HCT: 31 % — ABNORMAL LOW (ref 39.0–52.0)
Hemoglobin: 10.5 g/dL — ABNORMAL LOW (ref 13.0–17.0)
Potassium: 3.9 mmol/L (ref 3.5–5.1)
Sodium: 140 mmol/L (ref 135–145)

## 2015-03-03 LAB — PREPARE RBC (CROSSMATCH)

## 2015-03-03 SURGERY — ARTHROPLASTY, HIP, TOTAL, ANTERIOR APPROACH
Anesthesia: General | Site: Hip | Laterality: Left

## 2015-03-03 MED ORDER — PHENYLEPHRINE HCL 10 MG/ML IJ SOLN
INTRAMUSCULAR | Status: AC
  Filled 2015-03-03: qty 1

## 2015-03-03 MED ORDER — ONDANSETRON HCL 4 MG PO TABS: 4.0000 mg | ORAL_TABLET | Freq: Four times a day (QID) | ORAL | Status: DC | PRN

## 2015-03-03 MED ORDER — ALBUMIN HUMAN 5 % IV SOLN
INTRAVENOUS | Status: AC
  Administered 2015-03-03: 12.5 g
  Filled 2015-03-03: qty 250

## 2015-03-03 MED ORDER — GLYCOPYRROLATE 0.2 MG/ML IJ SOLN
INTRAMUSCULAR | Status: AC
  Filled 2015-03-03: qty 1

## 2015-03-03 MED ORDER — ACETAMINOPHEN 650 MG RE SUPP: 650.0000 mg | Freq: Four times a day (QID) | RECTAL | Status: DC | PRN

## 2015-03-03 MED ORDER — GLYCOPYRROLATE 0.2 MG/ML IJ SOLN
INTRAMUSCULAR | Status: AC
  Filled 2015-03-03: qty 4

## 2015-03-03 MED ORDER — ASPIRIN EC 325 MG PO TBEC
325.0000 mg | DELAYED_RELEASE_TABLET | Freq: Two times a day (BID) | ORAL | Status: DC
  Administered 2015-03-05: 325 mg via ORAL
  Filled 2015-03-03 (×2): qty 1

## 2015-03-03 MED ORDER — DEXAMETHASONE SODIUM PHOSPHATE 10 MG/ML IJ SOLN
10.0000 mg | Freq: Once | INTRAMUSCULAR | Status: DC
  Filled 2015-03-03 (×2): qty 1

## 2015-03-03 MED ORDER — METHOCARBAMOL 500 MG PO TABS
ORAL_TABLET | ORAL | Status: AC
  Administered 2015-03-04: 500 mg via ORAL
  Filled 2015-03-03: qty 1

## 2015-03-03 MED ORDER — NEOSTIGMINE METHYLSULFATE 10 MG/10ML IV SOLN
INTRAVENOUS | Status: AC
  Filled 2015-03-03: qty 1

## 2015-03-03 MED ORDER — OXYCODONE-ACETAMINOPHEN 5-325 MG PO TABS: 1.0000 | ORAL_TABLET | Freq: Four times a day (QID) | ORAL | Status: DC | PRN

## 2015-03-03 MED ORDER — ALBUMIN HUMAN 5 % IV SOLN
INTRAVENOUS | Status: AC
  Filled 2015-03-03: qty 250

## 2015-03-03 MED ORDER — SUCCINYLCHOLINE CHLORIDE 20 MG/ML IJ SOLN
INTRAMUSCULAR | Status: DC | PRN
  Administered 2015-03-03: 140 mg via INTRAVENOUS

## 2015-03-03 MED ORDER — BUPIVACAINE HCL 0.5 % IJ SOLN
INTRAMUSCULAR | Status: DC | PRN
  Administered 2015-03-03: 20 mL

## 2015-03-03 MED ORDER — ACETAMINOPHEN 325 MG PO TABS: 650.0000 mg | ORAL_TABLET | Freq: Four times a day (QID) | ORAL | Status: DC | PRN

## 2015-03-03 MED ORDER — METHOCARBAMOL 500 MG PO TABS
500.0000 mg | ORAL_TABLET | Freq: Four times a day (QID) | ORAL | Status: DC | PRN
  Administered 2015-03-03 – 2015-03-05 (×5): 500 mg via ORAL
  Filled 2015-03-03 (×4): qty 1

## 2015-03-03 MED ORDER — BUPIVACAINE HCL (PF) 0.5 % IJ SOLN
INTRAMUSCULAR | Status: AC
  Filled 2015-03-03: qty 30

## 2015-03-03 MED ORDER — LIDOCAINE HCL (CARDIAC) 20 MG/ML IV SOLN
INTRAVENOUS | Status: DC | PRN
  Administered 2015-03-03: 100 mg via INTRAVENOUS

## 2015-03-03 MED ORDER — PROMETHAZINE HCL 25 MG/ML IJ SOLN: 12.5000 mg | Freq: Four times a day (QID) | INTRAMUSCULAR | Status: DC | PRN

## 2015-03-03 MED ORDER — KETOROLAC TROMETHAMINE 30 MG/ML IJ SOLN
INTRAMUSCULAR | Status: DC | PRN
  Administered 2015-03-03: 30 mg via INTRAVENOUS

## 2015-03-03 MED ORDER — PHENYLEPHRINE 40 MCG/ML (10ML) SYRINGE FOR IV PUSH (FOR BLOOD PRESSURE SUPPORT)
PREFILLED_SYRINGE | INTRAVENOUS | Status: AC
  Filled 2015-03-03: qty 20

## 2015-03-03 MED ORDER — ATROPINE SULFATE 0.1 MG/ML IJ SOLN
INTRAMUSCULAR | Status: AC
  Filled 2015-03-03: qty 10

## 2015-03-03 MED ORDER — OXYCODONE HCL 5 MG PO TABS
5.0000 mg | ORAL_TABLET | ORAL | Status: DC | PRN
  Administered 2015-03-03 – 2015-03-04 (×2): 10 mg via ORAL
  Administered 2015-03-04 (×4): 5 mg via ORAL
  Administered 2015-03-05 (×3): 10 mg via ORAL
  Filled 2015-03-03 (×2): qty 2
  Filled 2015-03-03 (×2): qty 1
  Filled 2015-03-03: qty 2
  Filled 2015-03-03 (×2): qty 1
  Filled 2015-03-03: qty 2

## 2015-03-03 MED ORDER — FENTANYL CITRATE (PF) 100 MCG/2ML IJ SOLN
INTRAMUSCULAR | Status: DC | PRN
  Administered 2015-03-03 (×2): 50 ug via INTRAVENOUS
  Administered 2015-03-03: 100 ug via INTRAVENOUS

## 2015-03-03 MED ORDER — METHOCARBAMOL 750 MG PO TABS: 750.0000 mg | ORAL_TABLET | Freq: Three times a day (TID) | ORAL | Status: DC | PRN

## 2015-03-03 MED ORDER — ASPIRIN EC 325 MG PO TBEC: 325.0000 mg | DELAYED_RELEASE_TABLET | Freq: Two times a day (BID) | ORAL | Status: DC

## 2015-03-03 MED ORDER — TRANEXAMIC ACID 1000 MG/10ML IV SOLN
1000.0000 mg | INTRAVENOUS | Status: AC
  Administered 2015-03-03: 1000 mg via INTRAVENOUS
  Filled 2015-03-03: qty 10

## 2015-03-03 MED ORDER — SODIUM CHLORIDE 0.9 % IV BOLUS (SEPSIS): 500.0000 mL | Freq: Once | INTRAVENOUS | Status: DC

## 2015-03-03 MED ORDER — AMLODIPINE BESYLATE 5 MG PO TABS
5.0000 mg | ORAL_TABLET | Freq: Every day | ORAL | Status: DC
  Administered 2015-03-05: 5 mg via ORAL
  Filled 2015-03-03 (×2): qty 1

## 2015-03-03 MED ORDER — POLYETHYLENE GLYCOL 3350 17 G PO PACK: 17.0000 g | PACK | Freq: Every day | ORAL | Status: DC | PRN

## 2015-03-03 MED ORDER — DIPHENHYDRAMINE HCL 12.5 MG/5ML PO ELIX: 12.5000 mg | ORAL_SOLUTION | ORAL | Status: DC | PRN

## 2015-03-03 MED ORDER — PHENYLEPHRINE HCL 10 MG/ML IJ SOLN
10.0000 mg | INTRAVENOUS | Status: DC | PRN
  Administered 2015-03-03: 50 ug/min via INTRAVENOUS

## 2015-03-03 MED ORDER — ONDANSETRON HCL 4 MG/2ML IJ SOLN: 4.0000 mg | Freq: Four times a day (QID) | INTRAMUSCULAR | Status: DC | PRN

## 2015-03-03 MED ORDER — SUCCINYLCHOLINE CHLORIDE 20 MG/ML IJ SOLN
INTRAMUSCULAR | Status: AC
  Filled 2015-03-03: qty 1

## 2015-03-03 MED ORDER — ALBUMIN HUMAN 5 % IV SOLN
INTRAVENOUS | Status: DC | PRN
  Administered 2015-03-03 (×2): via INTRAVENOUS

## 2015-03-03 MED ORDER — HYDROMORPHONE HCL 1 MG/ML IJ SOLN
0.2500 mg | INTRAMUSCULAR | Status: DC | PRN
  Administered 2015-03-03 (×2): 0.5 mg via INTRAVENOUS
  Administered 2015-03-03: 1 mg via INTRAVENOUS

## 2015-03-03 MED ORDER — TRANEXAMIC ACID 1000 MG/10ML IV SOLN
2000.0000 mg | INTRAVENOUS | Status: AC
  Administered 2015-03-03: 2000 mg via TOPICAL
  Filled 2015-03-03: qty 20

## 2015-03-03 MED ORDER — KETOROLAC TROMETHAMINE 15 MG/ML IJ SOLN
15.0000 mg | Freq: Three times a day (TID) | INTRAMUSCULAR | Status: AC
  Administered 2015-03-03 – 2015-03-04 (×4): 15 mg via INTRAVENOUS
  Filled 2015-03-03 (×3): qty 1

## 2015-03-03 MED ORDER — GLYCOPYRROLATE 0.2 MG/ML IJ SOLN
INTRAMUSCULAR | Status: DC | PRN
  Administered 2015-03-03: 0.1 mg via INTRAVENOUS
  Administered 2015-03-03: .7 mg via INTRAVENOUS

## 2015-03-03 MED ORDER — MIDAZOLAM HCL 5 MG/5ML IJ SOLN
INTRAMUSCULAR | Status: DC | PRN
  Administered 2015-03-03: 2 mg via INTRAVENOUS

## 2015-03-03 MED ORDER — METHOCARBAMOL 1000 MG/10ML IJ SOLN
500.0000 mg | Freq: Four times a day (QID) | INTRAVENOUS | Status: DC | PRN
  Filled 2015-03-03: qty 5

## 2015-03-03 MED ORDER — ROCURONIUM BROMIDE 50 MG/5ML IV SOLN
INTRAVENOUS | Status: AC
  Filled 2015-03-03: qty 3

## 2015-03-03 MED ORDER — SODIUM CHLORIDE 0.9 % IV SOLN
INTRAVENOUS | Status: DC
  Administered 2015-03-03: 18:00:00 via INTRAVENOUS

## 2015-03-03 MED ORDER — FENTANYL CITRATE (PF) 250 MCG/5ML IJ SOLN
INTRAMUSCULAR | Status: AC
  Filled 2015-03-03: qty 5

## 2015-03-03 MED ORDER — KETOROLAC TROMETHAMINE 15 MG/ML IJ SOLN
INTRAMUSCULAR | Status: AC
  Administered 2015-03-04: 15 mg via INTRAVENOUS
  Filled 2015-03-03: qty 1

## 2015-03-03 MED ORDER — BISOPROLOL-HYDROCHLOROTHIAZIDE 10-6.25 MG PO TABS
1.0000 | ORAL_TABLET | Freq: Every day | ORAL | Status: DC
  Filled 2015-03-03 (×2): qty 1

## 2015-03-03 MED ORDER — PHENYLEPHRINE HCL 10 MG/ML IJ SOLN
INTRAMUSCULAR | Status: DC | PRN
  Administered 2015-03-03: 180 ug via INTRAVENOUS
  Administered 2015-03-03 (×2): 40 ug via INTRAVENOUS
  Administered 2015-03-03 (×3): 80 ug via INTRAVENOUS

## 2015-03-03 MED ORDER — ALUM & MAG HYDROXIDE-SIMETH 200-200-20 MG/5ML PO SUSP: 30.0000 mL | ORAL | Status: DC | PRN

## 2015-03-03 MED ORDER — PROMETHAZINE HCL 25 MG/ML IJ SOLN: 6.2500 mg | INTRAMUSCULAR | Status: DC | PRN

## 2015-03-03 MED ORDER — ONDANSETRON HCL 4 MG/2ML IJ SOLN
INTRAMUSCULAR | Status: DC | PRN
  Administered 2015-03-03: 4 mg via INTRAVENOUS

## 2015-03-03 MED ORDER — OXYCODONE HCL 5 MG PO TABS
ORAL_TABLET | ORAL | Status: AC
  Administered 2015-03-04: 5 mg via ORAL
  Filled 2015-03-03: qty 2

## 2015-03-03 MED ORDER — ALBUMIN HUMAN 5 % IV SOLN
12.5000 g | Freq: Once | INTRAVENOUS | Status: AC
  Administered 2015-03-03: 12.5 g via INTRAVENOUS

## 2015-03-03 MED ORDER — MIDAZOLAM HCL 2 MG/2ML IJ SOLN
INTRAMUSCULAR | Status: AC
  Filled 2015-03-03: qty 2

## 2015-03-03 MED ORDER — CEFAZOLIN SODIUM-DEXTROSE 2-3 GM-% IV SOLR
2.0000 g | Freq: Four times a day (QID) | INTRAVENOUS | Status: AC
  Administered 2015-03-03 – 2015-03-04 (×2): 2 g via INTRAVENOUS
  Filled 2015-03-03 (×3): qty 50

## 2015-03-03 MED ORDER — HYDROMORPHONE HCL 1 MG/ML IJ SOLN
INTRAMUSCULAR | Status: AC
  Filled 2015-03-03: qty 1

## 2015-03-03 MED ORDER — LOSARTAN POTASSIUM 50 MG PO TABS
100.0000 mg | ORAL_TABLET | Freq: Every day | ORAL | Status: DC
  Administered 2015-03-05: 100 mg via ORAL
  Filled 2015-03-03 (×2): qty 2

## 2015-03-03 MED ORDER — BISACODYL 5 MG PO TBEC: 5.0000 mg | DELAYED_RELEASE_TABLET | Freq: Every day | ORAL | Status: DC | PRN

## 2015-03-03 MED ORDER — 0.9 % SODIUM CHLORIDE (POUR BTL) OPTIME
TOPICAL | Status: DC | PRN
  Administered 2015-03-03: 1000 mL

## 2015-03-03 MED ORDER — ALBUMIN HUMAN 25 % IV SOLN: 12.5000 g | Freq: Once | INTRAVENOUS | Status: DC

## 2015-03-03 MED ORDER — EPHEDRINE SULFATE 50 MG/ML IJ SOLN
INTRAMUSCULAR | Status: AC
  Administered 2015-03-03: 10 mg
  Filled 2015-03-03: qty 1

## 2015-03-03 MED ORDER — BUPIVACAINE LIPOSOME 1.3 % IJ SUSP
20.0000 mL | INTRAMUSCULAR | Status: AC
  Administered 2015-03-03: 20 mL
  Filled 2015-03-03: qty 20

## 2015-03-03 MED ORDER — KETOROLAC TROMETHAMINE 30 MG/ML IJ SOLN
INTRAMUSCULAR | Status: AC
  Filled 2015-03-03: qty 1

## 2015-03-03 MED ORDER — DOCUSATE SODIUM 100 MG PO CAPS
100.0000 mg | ORAL_CAPSULE | Freq: Two times a day (BID) | ORAL | Status: DC
  Administered 2015-03-03 – 2015-03-05 (×3): 100 mg via ORAL
  Filled 2015-03-03 (×4): qty 1

## 2015-03-03 MED ORDER — ROCURONIUM BROMIDE 100 MG/10ML IV SOLN
INTRAVENOUS | Status: DC | PRN
  Administered 2015-03-03 (×3): 20 mg via INTRAVENOUS
  Administered 2015-03-03 (×2): 10 mg via INTRAVENOUS
  Administered 2015-03-03: 30 mg via INTRAVENOUS

## 2015-03-03 MED ORDER — EPHEDRINE SULFATE 50 MG/ML IJ SOLN
INTRAMUSCULAR | Status: DC | PRN
  Administered 2015-03-03 (×3): 10 mg via INTRAVENOUS
  Administered 2015-03-03: 30 mg via INTRAVENOUS

## 2015-03-03 MED ORDER — HYDROMORPHONE HCL 1 MG/ML IJ SOLN
INTRAMUSCULAR | Status: AC
  Filled 2015-03-03: qty 2

## 2015-03-03 MED ORDER — HYDROMORPHONE HCL 1 MG/ML IJ SOLN
1.0000 mg | INTRAMUSCULAR | Status: DC | PRN
  Administered 2015-03-04: 1 mg via INTRAVENOUS
  Filled 2015-03-03: qty 1

## 2015-03-03 MED ORDER — LACTATED RINGERS IV SOLN
INTRAVENOUS | Status: DC
  Administered 2015-03-03 (×3): via INTRAVENOUS

## 2015-03-03 MED ORDER — EPHEDRINE SULFATE 50 MG/ML IJ SOLN: 10.0000 mg | Freq: Once | INTRAMUSCULAR | Status: DC

## 2015-03-03 MED ORDER — NEOSTIGMINE METHYLSULFATE 10 MG/10ML IV SOLN
INTRAVENOUS | Status: DC | PRN
  Administered 2015-03-03: 4 mg via INTRAVENOUS

## 2015-03-03 MED ORDER — PROPOFOL 10 MG/ML IV BOLUS
INTRAVENOUS | Status: DC | PRN
  Administered 2015-03-03: 200 mg via INTRAVENOUS

## 2015-03-03 MED ORDER — ZOLPIDEM TARTRATE 5 MG PO TABS
5.0000 mg | ORAL_TABLET | Freq: Every evening | ORAL | Status: DC | PRN
  Administered 2015-03-04: 5 mg via ORAL
  Filled 2015-03-03 (×2): qty 1

## 2015-03-03 MED ORDER — MAGNESIUM CITRATE PO SOLN: 1.0000 | Freq: Once | ORAL | Status: DC | PRN

## 2015-03-03 SURGICAL SUPPLY — 66 items
APL SKNCLS STERI-STRIP NONHPOA (GAUZE/BANDAGES/DRESSINGS) ×1
BENZOIN TINCTURE PRP APPL 2/3 (GAUZE/BANDAGES/DRESSINGS) ×3 IMPLANT
BLADE SAW SGTL 18X1.27X75 (BLADE) ×2 IMPLANT
BLADE SAW SGTL 18X1.27X75MM (BLADE) ×1
BLADE SURG ROTATE 9660 (MISCELLANEOUS) IMPLANT
BNDG COHESIVE 6X5 TAN STRL LF (GAUZE/BANDAGES/DRESSINGS) IMPLANT
BNDG GAUZE ELAST 4 BULKY (GAUZE/BANDAGES/DRESSINGS) IMPLANT
CAPT HIP TOTAL 2 ×2 IMPLANT
CELLS DAT CNTRL 66122 CELL SVR (MISCELLANEOUS) ×1 IMPLANT
CLOSURE STERI-STRIP 1/2X4 (GAUZE/BANDAGES/DRESSINGS)
CLOSURE WOUND 1/2 X4 (GAUZE/BANDAGES/DRESSINGS) ×1
CLSR STERI-STRIP ANTIMIC 1/2X4 (GAUZE/BANDAGES/DRESSINGS) IMPLANT
COVER PERINEAL POST (MISCELLANEOUS) ×3 IMPLANT
COVER SURGICAL LIGHT HANDLE (MISCELLANEOUS) ×3 IMPLANT
DRAPE C-ARM 42X72 X-RAY (DRAPES) ×3 IMPLANT
DRAPE STERI IOBAN 125X83 (DRAPES) ×3 IMPLANT
DRAPE U-SHAPE 47X51 STRL (DRAPES) ×8 IMPLANT
DRSG AQUACEL AG ADV 3.5X10 (GAUZE/BANDAGES/DRESSINGS) ×2 IMPLANT
DRSG MEPILEX BORDER 4X8 (GAUZE/BANDAGES/DRESSINGS) ×1 IMPLANT
DURAPREP 26ML APPLICATOR (WOUND CARE) ×3 IMPLANT
ELECT BLADE 4.0 EZ CLEAN MEGAD (MISCELLANEOUS) ×6
ELECT CAUTERY BLADE 6.4 (BLADE) ×1 IMPLANT
ELECT REM PT RETURN 9FT ADLT (ELECTROSURGICAL) ×3
ELECTRODE BLDE 4.0 EZ CLN MEGD (MISCELLANEOUS) IMPLANT
ELECTRODE REM PT RTRN 9FT ADLT (ELECTROSURGICAL) ×1 IMPLANT
GAUZE XEROFORM 1X8 LF (GAUZE/BANDAGES/DRESSINGS) ×1 IMPLANT
GLOVE BIOGEL PI IND STRL 8 (GLOVE) ×2 IMPLANT
GLOVE BIOGEL PI INDICATOR 8 (GLOVE) ×4
GLOVE ECLIPSE 7.5 STRL STRAW (GLOVE) ×6 IMPLANT
GOWN STRL REUS W/ TWL LRG LVL3 (GOWN DISPOSABLE) ×2 IMPLANT
GOWN STRL REUS W/ TWL XL LVL3 (GOWN DISPOSABLE) ×2 IMPLANT
GOWN STRL REUS W/TWL LRG LVL3 (GOWN DISPOSABLE) ×6
GOWN STRL REUS W/TWL XL LVL3 (GOWN DISPOSABLE) ×6
HOOD PEEL AWAY FACE SHEILD DIS (HOOD) ×6 IMPLANT
KIT BASIN OR (CUSTOM PROCEDURE TRAY) ×3 IMPLANT
KIT ROOM TURNOVER OR (KITS) ×3 IMPLANT
MANIFOLD NEPTUNE II (INSTRUMENTS) ×3 IMPLANT
NDL SPNL 22GX3.5 QUINCKE BK (NEEDLE) ×1 IMPLANT
NEEDLE SPNL 22GX3.5 QUINCKE BK (NEEDLE) ×3 IMPLANT
NS IRRIG 1000ML POUR BTL (IV SOLUTION) ×5 IMPLANT
PACK TOTAL JOINT (CUSTOM PROCEDURE TRAY) ×3 IMPLANT
PACK UNIVERSAL I (CUSTOM PROCEDURE TRAY) ×3 IMPLANT
PAD ARMBOARD 7.5X6 YLW CONV (MISCELLANEOUS) ×6 IMPLANT
PENCIL BUTTON HOLSTER BLD 10FT (ELECTRODE) ×2 IMPLANT
RETRACTOR WND ALEXIS 18 MED (MISCELLANEOUS) IMPLANT
RTRCTR WOUND ALEXIS 18CM MED (MISCELLANEOUS) ×3
RTRCTR WOUND ALEXIS 18CM SML (INSTRUMENTS)
SAVER CELL AAL HAEMONETICS (INSTRUMENTS) ×1 IMPLANT
SPONGE LAP 18X18 X RAY DECT (DISPOSABLE) IMPLANT
STAPLER VISISTAT 35W (STAPLE) IMPLANT
STRIP CLOSURE SKIN 1/2X4 (GAUZE/BANDAGES/DRESSINGS) ×1 IMPLANT
SUT ETHIBOND NAB CT1 #1 30IN (SUTURE) ×8 IMPLANT
SUT MNCRL AB 3-0 PS2 18 (SUTURE) IMPLANT
SUT VIC AB 0 CT1 27 (SUTURE) ×3
SUT VIC AB 0 CT1 27XBRD ANBCTR (SUTURE) ×1 IMPLANT
SUT VIC AB 1 CT1 27 (SUTURE) ×3
SUT VIC AB 1 CT1 27XBRD ANBCTR (SUTURE) ×2 IMPLANT
SUT VIC AB 2-0 CT1 27 (SUTURE) ×6
SUT VIC AB 2-0 CT1 TAPERPNT 27 (SUTURE) ×1 IMPLANT
SYR 50ML LL SCALE MARK (SYRINGE) ×3 IMPLANT
TOWEL OR 17X24 6PK STRL BLUE (TOWEL DISPOSABLE) ×3 IMPLANT
TOWEL OR 17X26 10 PK STRL BLUE (TOWEL DISPOSABLE) ×3 IMPLANT
TRAY CATH 16FR W/PLASTIC CATH (SET/KITS/TRAYS/PACK) IMPLANT
TRAY FOLEY CATH 16FR SILVER (SET/KITS/TRAYS/PACK) IMPLANT
WATER STERILE IRR 1000ML POUR (IV SOLUTION) ×2 IMPLANT
YANKAUER SUCT BULB TIP NO VENT (SUCTIONS) ×2 IMPLANT

## 2015-03-03 NOTE — Transfer of Care (Signed)
Immediate Anesthesia Transfer of Care Note  Patient: [name] Rose  Procedure(s) Performed: Procedure(s): LEFT TOTAL HIP ARTHROPLASTY ANTERIOR APPROACH (Left)  Patient Location: PACU  Anesthesia Type:General  Level of Consciousness: awake, alert  and oriented  Airway & Oxygen Therapy: Patient Spontanous Breathing and Patient connected to nasal cannula oxygen  Post-op Assessment: Report given to RN and Post -op Vital signs reviewed and stable  Post vital signs: Reviewed and stable  Last Vitals:  Filed Vitals:   03/03/15 1153 03/03/15 1658  BP: 151/80   Pulse: 59 82  Temp: 36.6 C 37.1 C  Resp: 20 9    Complications: No apparent anesthesia complications

## 2015-03-03 NOTE — Discharge Instructions (Signed)

## 2015-03-03 NOTE — H&P (Signed)
TOTAL HIP ADMISSION [name]  Patient is admitted for left total hip arthroplasty.  Subjective:  Chief Complaint: left hip pain  HPI: [name] Rose, 64 y.o. male, has a history of pain and functional disability in the left hip(s) due to arthritis and patient has failed non-surgical conservative treatments for greater than 12 weeks to include NSAID's and/or analgesics, corticosteriod injections, use of assistive devices, weight reduction as appropriate and activity modification.  Onset of symptoms was gradual starting 3 years ago with gradually worsening course since that time.The patient noted no past surgery on the left hip(s).  Patient currently rates pain in the left hip at 8 out of 10 with activity. Patient has night pain, worsening of pain with activity and weight bearing, trendelenberg gait, pain that interfers with activities of daily living, pain with passive range of motion, crepitus and joint swelling. Patient has evidence of subchondral sclerosis, periarticular osteophytes and joint space narrowing by imaging studies. This condition presents safety issues increasing the risk of falls. This patient has had failure of all reasonable conservative care.  There is no current active infection.  There are no active problems to display for this patient.  Past Medical History  Diagnosis Date  . Hypertension   . Arthritis     Past Surgical History  Procedure Laterality Date  . Back surgery      x4 surgeries prior to 2000  . Joint replacement Bilateral 2011    spaced 4 months agart  . Knee arthroscopy  2009  . Prostatectomy  2008  . Cataract extraction Bilateral 2001    Prescriptions prior to admission  Medication Sig Dispense Refill Last Dose  . acetaminophen (TYLENOL) 500 MG tablet Take 1,000 mg by mouth every 8 (eight) hours as needed for mild pain or moderate pain.   03/02/2015 at Unknown time  . amLODipine (NORVASC) 5 MG tablet Take 5 mg by mouth daily.   03/03/2015 at 0900  .  bisoprolol-hydrochlorothiazide (ZIAC) 10-6.25 MG tablet Take 1 tablet by mouth daily.   03/03/2015 at 0900  . losartan (COZAAR) 100 MG tablet Take 100 mg by mouth daily.   03/03/2015 at 0900  . naproxen sodium (ALEVE) 220 MG tablet Take 220 mg by mouth 2 (two) times daily with a meal.   Past Week at Unknown time   No Known Allergies  Social History  Substance Use Topics  . Smoking status: Former Smoker    Quit date: 09/26/1994  . Smokeless tobacco: Not on file  . Alcohol Use: Yes     Comment: occasionally    History reviewed. No pertinent family history.   ROS   ROS: I have reviewed the patient's review of systems thoroughly and there are no positive responses as relates to the HPI. Objective:  Physical Exam  Vital signs in last 24 hours: Temp:  [97.9 F (36.6 C)] 97.9 F (36.6 C) (11/21 1153) Pulse Rate:  [59] 59 (11/21 1153) Resp:  [20] 20 (11/21 1153) BP: (151)/(80) 151/80 mmHg (11/21 1153) SpO2:  [97 %] 97 % (11/21 1153) Weight:  [126.554 kg (279 lb)] 126.554 kg (279 lb) (11/21 1153) Well-developed well-nourished patient in no acute distress. Alert and oriented x3 HEENT:within normal limits Cardiac: Regular rate and rhythm Pulmonary: Lungs clear to auscultation Abdomen: Soft and nontender.  Normal active bowel sounds  Musculoskeletal: (Left hip: Limited internal rotation.  Pain to range of motion.  Neurovascularly intact distally. Labs: Recent Results (from the past 2160 hour(s))  Surgical pcr screen  Status: None   Collection Time: 02/26/15  9:45 AM  Result Value Ref Range   MRSA, PCR NEGATIVE NEGATIVE   Staphylococcus aureus NEGATIVE NEGATIVE    Comment:        The Xpert SA Assay (FDA approved for NASAL specimens in patients over 47 years of age), is one component of a comprehensive surveillance program.  Test performance has been validated by [facility] for patients greater than or equal to 19 year old. It is not intended to diagnose infection nor  to guide or monitor treatment.   Type and screen Order type and screen if day of surgery is less than 15 days from draw of preadmission visit or order morning of surgery if day of surgery is greater than 6 days from preadmission visit.     Status: None   Collection Time: 02/26/15  9:45 AM  Result Value Ref Range   ABO/RH(D) A POS    Antibody Screen NEG    Sample Expiration 03/12/2015    Extend sample reason NO TRANSFUSIONS OR PREGNANCY IN THE PAST 3 MONTHS   APTT     Status: None   Collection Time: 02/26/15  9:55 AM  Result Value Ref Range   aPTT 29 24 - 37 seconds  CBC WITH DIFFERENTIAL     Status: None   Collection Time: 02/26/15  9:55 AM  Result Value Ref Range   WBC 8.1 4.0 - 10.5 K/uL   RBC 5.24 4.22 - 5.81 MIL/uL   Hemoglobin 15.4 13.0 - 17.0 g/dL   HCT 45.0 39.0 - 52.0 %   MCV 85.9 78.0 - 100.0 fL   MCH 29.4 26.0 - 34.0 pg   MCHC 34.2 30.0 - 36.0 g/dL   RDW 12.7 11.5 - 15.5 %   Platelets 210 150 - 400 K/uL   Neutrophils Relative % 60 %   Neutro Abs 4.8 1.7 - 7.7 K/uL   Lymphocytes Relative 28 %   Lymphs Abs 2.2 0.7 - 4.0 K/uL   Monocytes Relative 10 %   Monocytes Absolute 0.8 0.1 - 1.0 K/uL   Eosinophils Relative 3 %   Eosinophils Absolute 0.2 0.0 - 0.7 K/uL   Basophils Relative 1 %   Basophils Absolute 0.0 0.0 - 0.1 K/uL  Comprehensive metabolic panel     Status: Abnormal   Collection Time: 02/26/15  9:55 AM  Result Value Ref Range   Sodium 139 135 - 145 mmol/L   Potassium 4.1 3.5 - 5.1 mmol/L   Chloride 104 101 - 111 mmol/L   CO2 28 22 - 32 mmol/L   Glucose, Bld 106 (H) 65 - 99 mg/dL   BUN 15 6 - 20 mg/dL   Creatinine, Ser 0.94 0.61 - 1.24 mg/dL   Calcium 9.3 8.9 - 10.3 mg/dL   Total Protein 6.5 6.5 - 8.1 g/dL   Albumin 4.1 3.5 - 5.0 g/dL   AST 26 15 - 41 U/L   ALT 24 17 - 63 U/L   Alkaline Phosphatase 73 38 - 126 U/L   Total Bilirubin 1.0 0.3 - 1.2 mg/dL   GFR calc non Af Amer >60 >60 mL/min   GFR calc Af Amer >60 >60 mL/min    Comment: (NOTE) The  eGFR has been calculated using the CKD EPI equation. This calculation has not been validated in all clinical situations. eGFR's persistently <60 mL/min signify possible Chronic Kidney Disease.    Anion gap 7 5 - 15  Protime-INR     Status: None  Collection Time: 02/26/15  9:55 AM  Result Value Ref Range   Prothrombin Time 13.7 11.6 - 15.2 seconds   INR 1.03 0.00 - 1.49  Urinalysis, Routine w reflex microscopic (not at [facility])     Status: Abnormal   Collection Time: 02/26/15  9:56 AM  Result Value Ref Range   Color, Urine YELLOW YELLOW   APPearance CLEAR CLEAR   Specific Gravity, Urine 1.022 1.005 - 1.030   pH 5.5 5.0 - 8.0   Glucose, UA NEGATIVE NEGATIVE mg/dL   Hgb urine dipstick MODERATE (A) NEGATIVE   Bilirubin Urine NEGATIVE NEGATIVE   Ketones, ur NEGATIVE NEGATIVE mg/dL   Protein, ur NEGATIVE NEGATIVE mg/dL   Nitrite NEGATIVE NEGATIVE   Leukocytes, UA NEGATIVE NEGATIVE  Urine microscopic-add on     Status: Abnormal   Collection Time: 02/26/15  9:56 AM  Result Value Ref Range   Squamous Epithelial / LPF 0-5 (A) NONE SEEN    Comment: Please note change in reference range.   WBC, UA 0-5 0 - 5 WBC/hpf    Comment: Please note change in reference range.   RBC / HPF 6-30 0 - 5 RBC/hpf    Comment: Please note change in reference range.   Bacteria, UA RARE (A) NONE SEEN    Comment: Please note change in reference range.   Crystals CA OXALATE CRYSTALS (A) NEGATIVE    Estimated body mass index is 35.81 kg/(m^2) as calculated from the following:   Height as of 02/26/15: '6\' 2"'  (1.88 m).   Weight as of this encounter: 126.554 kg (279 lb).   Imaging Review Plain radiographs demonstrate severe degenerative joint disease of the left hip(s). The bone quality appears to be good for age and reported activity level.  Assessment/Plan:  End stage arthritis, left hip(s)  The patient history, physical examination, clinical judgement of the provider and imaging studies are consistent  with end stage degenerative joint disease of the left hip(s) and total hip arthroplasty is deemed medically necessary. The treatment options including medical management, injection therapy, arthroscopy and arthroplasty were discussed at length. The risks and benefits of total hip arthroplasty were presented and reviewed. The risks due to aseptic loosening, infection, stiffness, dislocation/subluxation,  thromboembolic complications and other imponderables were discussed.  The patient acknowledged the explanation, agreed to proceed with the plan and consent was signed. Patient is being admitted for inpatient treatment for surgery, pain control, PT, OT, prophylactic antibiotics, VTE prophylaxis, progressive ambulation and ADL's and discharge planning.The patient is planning to be discharged home with home health services

## 2015-03-03 NOTE — Progress Notes (Signed)
Voice message updates left on Gaspar Skeeters PA and Dr Berenice Primas phones with updated. They have not returned call. Pt awake, alert, VSS with SBP > 110

## 2015-03-03 NOTE — Anesthesia Preprocedure Evaluation (Addendum)
Anesthesia Evaluation  Patient identified by MRN, date of birth, ID band Patient awake    Reviewed: Allergy & Precautions, NPO status , Patient's Chart, lab work & pertinent test results  Airway Mallampati: II  TM Distance: >3 FB Neck ROM: Full    Dental   Pulmonary former smoker,    breath sounds clear to auscultation       Cardiovascular hypertension,  Rhythm:Regular Rate:Normal     Neuro/Psych    GI/Hepatic negative GI ROS, Neg liver ROS,   Endo/Other  negative endocrine ROS  Renal/GU negative Renal ROS     Musculoskeletal   Abdominal   Peds  Hematology   Anesthesia Other Findings   Reproductive/Obstetrics                            Anesthesia Physical Anesthesia Plan  ASA: III  Anesthesia Plan: General   Post-op Pain Management:    Induction: Intravenous  Airway Management Planned: Oral ETT  Additional Equipment:   Intra-op Plan:   Post-operative Plan: Extubation in OR  Informed Consent: I have reviewed the patients History and Physical, chart, labs and discussed the procedure including the risks, benefits and alternatives for the proposed anesthesia with the patient or authorized representative who has indicated his/her understanding and acceptance.   Dental advisory given  Plan Discussed with: CRNA and Anesthesiologist  Anesthesia Plan Comments:         Anesthesia Quick Evaluation

## 2015-03-03 NOTE — Anesthesia Postprocedure Evaluation (Signed)
Anesthesia Post Note  Patient: [name] Rose  Procedure(s) Performed: Procedure(s) (LRB): LEFT TOTAL HIP ARTHROPLASTY ANTERIOR APPROACH (Left)  Anesthesia Post Evaluation  Last Vitals:  Filed Vitals:   03/03/15 1153 03/03/15 1658  BP: 151/80   Pulse: 59 82  Temp: 36.6 C 37.1 C  Resp: 20 9    Last Pain:  Filed Vitals:   03/03/15 1702  PainSc: 0-No pain                 Michall Noffke

## 2015-03-03 NOTE — Anesthesia Procedure Notes (Addendum)
Procedure Name: Intubation Date/Time: 03/03/2015 1:43 PM Performed by: Layla Maw Pre-anesthesia Checklist: Patient identified, Patient being monitored, Timeout performed, Emergency Drugs available and Suction available Patient Re-evaluated:Patient Re-evaluated prior to inductionOxygen Delivery Method: Circle System Utilized Preoxygenation: Pre-oxygenation with 100% oxygen Intubation Type: IV induction and Rapid sequence Laryngoscope Size: Mac and 4 Grade View: Grade II Tube type: Oral Tube size: 7.5 mm Number of attempts: 1 Airway Equipment and Method: Stylet Placement Confirmation: positive ETCO2 and breath sounds checked- equal and bilateral Secured at: 24 cm Tube secured with: Tape Dental Injury: Teeth and Oropharynx as per pre-operative assessment  Comments: Intubated by Derek Mound. CRNA

## 2015-03-03 NOTE — Brief Op Note (Signed)
03/03/2015  4:26 PM  PATIENT:  Gwynn Burly Rom  64 y.o. male  PRE-OPERATIVE DIAGNOSIS:  osteoarthritis left hip  POST-OPERATIVE DIAGNOSIS:  osteoarthritis left hip  PROCEDURE:  Procedure(s): LEFT TOTAL HIP ARTHROPLASTY ANTERIOR APPROACH (Left)  SURGEON:  Surgeon(s) and Role:    * Dorna Leitz, MD - Primary  PHYSICIAN ASSISTANT:   ASSISTANTS: bethune   ANESTHESIA:   general  EBL:  Total I/O In: 2500 [I.V.:2000; IV Piggyback:500] Out: 2500 [Blood:2500]  BLOOD ADMINISTERED:none  DRAINS: none   LOCAL MEDICATIONS USED:  MARCAINE    and OTHER experel  SPECIMEN:  No Specimen  DISPOSITION OF SPECIMEN:  N/A  COUNTS:  YES  TOURNIQUET:  * No tourniquets in log *  DICTATION: .Other Dictation: Dictation Number [id number]  PLAN OF CARE: Admit to inpatient   PATIENT DISPOSITION:  PACU - hemodynamically stable.   Delay start of Pharmacological VTE agent (>24hrs) due to surgical blood loss or risk of bleeding: no

## 2015-03-03 NOTE — Progress Notes (Signed)
Report to M. Judye Bos RN as primary caregiver

## 2015-03-04 ENCOUNTER — Encounter (HOSPITAL_COMMUNITY): Payer: Self-pay | Admitting: Orthopedic Surgery

## 2015-03-04 DIAGNOSIS — D62 Acute posthemorrhagic anemia: Secondary | ICD-10-CM

## 2015-03-04 DIAGNOSIS — E876 Hypokalemia: Secondary | ICD-10-CM

## 2015-03-04 LAB — CBC
HCT: 20.5 % — ABNORMAL LOW (ref 39.0–52.0)
Hemoglobin: 6.8 g/dL — CL (ref 13.0–17.0)
MCH: 29.2 pg (ref 26.0–34.0)
MCHC: 33.2 g/dL (ref 30.0–36.0)
MCV: 88 fL (ref 78.0–100.0)
Platelets: 114 10*3/uL — ABNORMAL LOW (ref 150–400)
RBC: 2.33 MIL/uL — ABNORMAL LOW (ref 4.22–5.81)
RDW: 13.3 % (ref 11.5–15.5)
WBC: 8.6 10*3/uL (ref 4.0–10.5)

## 2015-03-04 LAB — POCT I-STAT EG7
Acid-Base Excess: 3 mmol/L — ABNORMAL HIGH (ref 0.0–2.0)
Bicarbonate: 28.9 mEq/L — ABNORMAL HIGH (ref 20.0–24.0)
Calcium, Ion: 1.13 mmol/L (ref 1.13–1.30)
HCT: 38 % — ABNORMAL LOW (ref 39.0–52.0)
Hemoglobin: 12.9 g/dL — ABNORMAL LOW (ref 13.0–17.0)
O2 Saturation: 47 %
Patient temperature: 35.9
Potassium: 4 mmol/L (ref 3.5–5.1)
Sodium: 140 mmol/L (ref 135–145)
TCO2: 30 mmol/L (ref 0–100)
pCO2, Ven: 48.4 mmHg (ref 45.0–50.0)
pH, Ven: 7.378 — ABNORMAL HIGH (ref 7.250–7.300)
pO2, Ven: 25 mmHg — CL (ref 30.0–45.0)

## 2015-03-04 LAB — BASIC METABOLIC PANEL
Anion gap: 7 (ref 5–15)
Anion gap: 6 (ref 5–15)
BUN: 13 mg/dL (ref 6–20)
BUN: 16 mg/dL (ref 6–20)
CO2: 20 mmol/L — ABNORMAL LOW (ref 22–32)
CO2: 28 mmol/L (ref 22–32)
Calcium: 5.8 mg/dL — CL (ref 8.9–10.3)
Calcium: 8.2 mg/dL — ABNORMAL LOW (ref 8.9–10.3)
Chloride: 114 mmol/L — ABNORMAL HIGH (ref 101–111)
Chloride: 103 mmol/L (ref 101–111)
Creatinine, Ser: 0.87 mg/dL (ref 0.61–1.24)
Creatinine, Ser: 1.16 mg/dL (ref 0.61–1.24)
GFR calc Af Amer: >60 mL/min (ref >60)
GFR calc Af Amer: >60 mL/min (ref >60)
GFR calc non Af Amer: >60 mL/min (ref >60)
GFR calc non Af Amer: >60 mL/min (ref >60)
Glucose, Bld: 97 mg/dL (ref 65–99)
Glucose, Bld: 125 mg/dL — ABNORMAL HIGH (ref 65–99)
Potassium: 2.6 mmol/L — CL (ref 3.5–5.1)
Potassium: 3.9 mmol/L (ref 3.5–5.1)
Sodium: 141 mmol/L (ref 135–145)
Sodium: 137 mmol/L (ref 135–145)

## 2015-03-04 LAB — PREPARE RBC (CROSSMATCH)

## 2015-03-04 MED ORDER — FUROSEMIDE 10 MG/ML IJ SOLN
10.0000 mg | Freq: Once | INTRAMUSCULAR | Status: DC
  Filled 2015-03-04 (×2): qty 2

## 2015-03-04 MED ORDER — POTASSIUM CHLORIDE IN NACL 40-0.9 MEQ/L-% IV SOLN
INTRAVENOUS | Status: DC
  Administered 2015-03-04 – 2015-03-05 (×2): 100 mL/h via INTRAVENOUS
  Filled 2015-03-04 (×5): qty 1000

## 2015-03-04 MED ORDER — POTASSIUM CHLORIDE 10 MEQ/100ML IV SOLN
10.0000 meq | INTRAVENOUS | Status: AC
  Administered 2015-03-04: 10 meq via INTRAVENOUS
  Filled 2015-03-04 (×2): qty 100

## 2015-03-04 MED ORDER — SODIUM CHLORIDE 0.9 % IV SOLN
Freq: Once | INTRAVENOUS | Status: AC
  Administered 2015-03-04: 10 mL/h via INTRAVENOUS

## 2015-03-04 MED ORDER — POTASSIUM CHLORIDE 10 MEQ/100ML IV SOLN
10.0000 meq | Freq: Once | INTRAVENOUS | Status: AC
  Administered 2015-03-04: 10 meq via INTRAVENOUS
  Filled 2015-03-04: qty 100

## 2015-03-04 NOTE — Op Note (Signed)
NAME:  [name] Rose, [name] NO.:  [id number]  MEDICAL RECORD NO.:  [id number]  LOCATION:  [id number]                        FACILITY:  [facility]  PHYSICIAN:  Alta Corning, M.D.   DATE OF BIRTH:  04/09/1951  DATE OF PROCEDURE:  03/03/2015 DATE OF DISCHARGE:                              OPERATIVE REPORT   PREOPERATIVE DIAGNOSIS:  End-stage degenerative joint disease, left hip.  POSTOPERATIVE DIAGNOSIS:  End-stage degenerative joint disease, left hip.  PROCEDURE:1.  Left total hip replacement with an anterior approach size 58- mm Sector Gription cup with a 36-mm neutral liner.  The stem is a size 13 high offset Corail stem and the head ball is a +12 mm ceramic 36-mm head ball. 2.interpretation of multiple intraoperative fluoroscopic images SURGEON:  Alta Corning, M.D.  ASSISTANT:  Gary Fleet, P.A.  ANESTHESIA:  General.  BRIEF HISTORY:  [name] is a 64 year old male with long history of significant complaints of left hip pain.  He had been treated conservatively for prolonged period of time after failure of all conservative care, he was taken to the operating room for left total hip replacement.  He had failed intra-articular injection and activity modification.  X-ray showed bone-on-bone change.  He was having night pain and light activity pain.  Because of his young age and active lifestyle, we felt that anterior approach was appropriate, he was brought to the operating room for this procedure.  DESCRIPTION OF PROCEDURE:  The patient was brought to the operative room.  After adequate level of anesthesia was obtained with general anesthetic, the patient was placed supine on the Hana bed.  The left hip was then prepped and draped in usual sterile fashion and following this, an incision was made for an anterior approach to the hip.  Subcutaneous tissue down the level of the tensor fascia, the fascia was divided, the muscle was retracted, the retractors were  put in place.  Vessels on the anterior aspect of the hip were cauterized and the capsule was then opened and tagged.  Following this, the hip was manipulated into an almost a dislocated position.  Anterior capsule was additionally taken down and the provisional neck cut was then made as well as the superior cut.  Once this was accomplished, the head ball was removed, retractors were put in place in the acetabulum and had an extremely shallow acetabulum.  We knew we need to get deep, so went deep initially and then went into an appropriate position of 45 degrees of lateral opening with 30 degrees of anteversion and we sequentially reamed up to a level of 56 mm, so we putted a 58-mm Sector cup as I thought it might not get as good a bite as I wanted and ultimately got a great bite, so we did not need to put any screws on it.  I put a hole eliminator in just because there was oozing throughout the case and oozing from the bone, so we thought we could slow down some of the blood loss through the cuff from with that.  We then putted a +4 neutral liner and went to the stem side, sequentially rasped the stem up to a size 13  and then did a trial. At that point, I was not happy with the stability and I felt like I needed offset.  I knew we were going to have to go long with the neck length to get back to what would be the appropriate leg length, but we felt that I needed both the offset to help with the stability.  Went to a high offset stem and then put a +12 ball on their and got excellent symmetric leg lengths.  At that point, stability was great, we could take it down to 60 of extension with 80 of external rotation before any tendency towards dislocation.  At that point, I was really happy with the stability and leg length.  Then, we chose this to be the final and then we opened a 13 KLA high-offset Corail stem, this was hammered into place, put a +12 ceramic ball on it, did a reduction, got  final x-rays, these were anatomic, then went to the pelvis view showed that we were essentially symmetric in leg length.  At this point, we were very happy with the overall situation.  Wound was irrigated and suctioned dry.  The anterior capsule was closed and the tensor fascia with 1 Vicryl running, the skin with 0 and 2-0 Vicryl and 3-0 Monocryl subcuticular.  The patient was just oozy throughout the case.  Every time we back on an acetabular reamer, the acetabulum was filled with blood, there was no actual anything bleeding that we could find, searching several ties really, we really could not stop that, we used intraoperatively 2 g of tranexamic acid and 100 mL left that in for 4 minutes and then sucked that out as well as the preoperative TXA and just we were unhappy with our ability to control that.  The estimated blood loss for the procedure was 2500 and I think it could be accurate.  We will get an H and H on him in the recovery room just to check and wait for additional parameters for any decisions about transfusion.  At this point, he was taken to the recovery room, was noted to be in satisfactory condition. Sterile compressive dressing was applied prior to going to the recover room.     Alta Corning, M.D.     Corliss Skains  D:  03/03/2015  T:  03/04/2015  Job:  [id number]

## 2015-03-04 NOTE — Progress Notes (Signed)
Subjective: 1 Day Post-Op Procedure(s) (LRB): LEFT TOTAL HIP ARTHROPLASTY ANTERIOR APPROACH (Left) Patient reports pain as moderate. Taking by mouth and voiding okay. Reports he feels "run down ".  Objective: Vital signs in last 24 hours: Temp:  [97.9 F (36.6 C)-98.7 F (37.1 C)] 98.3 F (36.8 C) (11/22 0525) Pulse Rate:  [57-84] 84 (11/22 0525) Resp:  [9-20] 18 (11/22 0525) BP: (68-151)/(30-93) 111/62 mmHg (11/22 0525) SpO2:  [95 %-100 %] 100 % (11/22 0525) Weight:  [126.554 kg (279 lb)] 126.554 kg (279 lb) (11/21 1209)  Intake/Output from previous day: 11/21 0701 - 11/22 0700 In: 2560 [I.V.:2060; IV Piggyback:500] Out: 2750 [Urine:250; Blood:2500] Intake/Output this shift:     Recent Labs  03/03/15 1453 03/03/15 1728 03/03/15 1734 03/04/15 0545  HGB 12.9* 11.1* 10.5* 6.8*    Recent Labs  03/03/15 1728 03/03/15 1734 03/04/15 0545  WBC 19.2*  --  8.6  RBC 3.74*  --  2.33*  HCT 32.7* 31.0* 20.5*  PLT 221  --  114*    Recent Labs  03/03/15 1734 03/04/15 0545  NA 140 141  K 3.9 2.6*  CL  --  114*  CO2  --  20*  BUN  --  13  CREATININE  --  0.87  GLUCOSE 163* 97  CALCIUM  --  5.8*   No results for input(s): LABPT, INR in the last 72 hours. Left hip exam: Neurovascular intact Sensation intact distally Intact pulses distally Dorsiflexion/Plantar flexion intact Incision: dressing C/D/I Compartment soft  Assessment/Plan: 1 Day Post-Op Procedure(s) (LRB): LEFT TOTAL HIP ARTHROPLASTY ANTERIOR APPROACH (Left)  Acute blood loss anemia.  Symptomatic. Hypokalemia. Plan: Will give 2 runs of IV potassium and   add potassium to IV fluid. Transfuse 2 units packed RBCs this a.m.check CBC in a.m. Spoke with nursing staff about holding BP meds if blood pressure in  Normal range. .  Check potassium this afternoon. Up with therapy, weightbearing as tolerated on left After blood given. Continue aspirin 325 mg  Twice daily for DVT prophylaxis also SCDs. Probable  discharge home tomorrow afternoon.   Pranish Akhavan G 03/04/2015, 9:48 AM

## 2015-03-04 NOTE — Care Management Note (Signed)
Case Management Note  Patient Details  Name: [name] Rose MRN: [id number] Date of Birth: [birth year]  Subjective/Objective:  64 yr old gentleman s/p left total hip arthroplasty, anterior approach.  Action/Plan: Case manager spoke with Patient and his wife concerning [place] and DME needs at discharge. Patient was preoperatively setup with [facility], no changes. Patient states he has rolling walker and 3in1. Has family support at discharge.   Expected Discharge Date:    03/05/15              Expected Discharge Plan:  [place]  In-House Referral:     Discharge planning Services  CM Consult  Post Acute Care Choice:  Home Health Choice offered to:     DME Arranged:   NA DME Agency:  NA  HH Arranged:  PT HH Agency:  [place]  Status of Service:  In process, will continue to follow  Medicare Important Message Given:    Date Medicare IM Given:    Medicare IM give by:    Date Additional Medicare IM Given:    Additional Medicare Important Message give by:     If discussed at [place] of Stay Meetings, dates discussed:    Additional Comments:  Ninfa Meeker, RN 03/04/2015, 2:32 PM

## 2015-03-04 NOTE — Progress Notes (Signed)
Utilization review completed.  

## 2015-03-04 NOTE — Progress Notes (Signed)
Lab call at 7pm with a critical lab value of Potassium 2.6 and Calcium 5.8.  They called again of another critical lab value of hemoglobin of 6.8.  Dr. Dorna Leitz office called at [phone] to notify of lab value. Had to wait for MD to call back. Dr. Joanell Rising returned phone call at [phone] and he was notified of critical lab values. He stated that [name] Rose was on the floor rounding and he will notify [name] Rose of critical lab values.

## 2015-03-04 NOTE — Evaluation (Signed)
Occupational Therapy Evaluation Patient Details Name: [name] Rose MRN: [id number] DOB: Jan 28, 1951 Today's Date: 03/04/2015    History of Present Illness 64 y.o. male admitted to [facility] on 03/03/15 for elective L THA direct anterior approach.  Post-op course complicated by anemia and hypokalemia.  2units PRBCs given as well as IV potassium. Pt with significant PMHx of HTN, multiple back surgeries, and bil TKA.     Clinical Impression   Pt s/p above. Pt independent with ADLs, PTA. Feel pt will benefit from acute OT to increase independence prior to d/c. Do not feel pt will need follow up OT upon d/c.    Follow Up Recommendations  No OT follow up;Supervision - Intermittent    Equipment Recommendations   (AE if wanted)    Recommendations for Other Services       Precautions / Restrictions Precautions Precautions: Fall Restrictions Weight Bearing Restrictions: Yes LLE Weight Bearing: Weight bearing as tolerated      Mobility Bed Mobility Overal bed mobility: Needs Assistance Bed Mobility: Supine to Sit     Supine to sit: Min assist     General bed mobility comments: assist with LLE  Transfers Overall transfer level: Needs assistance Equipment used: Rolling walker (2 wheeled) Transfers: Sit to/from Stand Sit to Stand: Min guard              Balance  No LOB in session. Balance not formally assessed.                                          ADL Overall ADL's : Needs assistance/impaired                     Lower Body Dressing: Moderate assistance;Sit to/from stand   Toilet Transfer: Min guard;Ambulation;RW (sit to stand from bed)           Functional mobility during ADLs: Min guard;Rolling walker General ADL Comments: Educated on LB dressing technique. Educated on safety such as safe footwear, use of bag on walker, and sitting for LB ADLs.     Vision     Perception     Praxis      Pertinent Vitals/Pain Pain  Assessment: 0-10 Pain Score:  (5-6) Pain Location: LLE Pain Descriptors / Indicators: Aching;Sore;Sharp Pain Intervention(s): Monitored during session;Repositioned     Hand Dominance Right   Extremity/Trunk Assessment Upper Extremity Assessment Upper Extremity Assessment: Overall WFL for tasks assessed   Lower Extremity Assessment Lower Extremity Assessment: Defer to PT evaluation LLE Deficits / Details: left leg with normal post op pain and weakness, ankle at least 3/5, knee, 3-/5, hip 2/5 LLE Sensation:  (WNL per pt report)   Cervical / Trunk Assessment Cervical / Trunk Assessment: Other exceptions Cervical / Trunk Exceptions: h/o multiple low back surgeries   Communication Communication Communication: No difficulties   Cognition Arousal/Alertness: Awake/alert Behavior During Therapy: WFL for tasks assessed/performed Overall Cognitive Status: Within Functional Limits for tasks assessed                     General Comments          Shoulder Instructions      Home Living Family/patient expects to be discharged to:: Private residence Living Arrangements: Spouse/significant other Available Help at Discharge: Family;Available 24 hours/day Type of Home: House Home Access: Stairs to enter [employer] of Steps: 1 Entrance Stairs-Rails:  None Home Layout: One level     Bathroom Shower/Tub: Walk-in shower;Door   Bathroom Toilet: Handicapped height Bathroom Accessibility: No (per PT eval, in the walk in shower no access with walker, could in tub-need to verify with pt)    Home Equipment: Walker - 2 wheels;Cane - single point;Bedside commode;Adaptive equipment Adaptive Equipment: Reacher        Prior Functioning/Environment Level of Independence: Independent             OT Diagnosis: Acute pain   OT Problem List: Pain;Decreased knowledge of precautions;Decreased knowledge of use of DME or AE;Decreased range of motion;Decreased activity  tolerance   OT Treatment/Interventions: Self-care/ADL training;DME and/or AE instruction;Therapeutic activities;Patient/family education;Balance training    OT Goals(Current goals can be found in the care plan section) Acute Rehab OT Goals Patient Stated Goal: go home OT Goal Formulation: With patient Time For Goal Achievement: 03/11/15 Potential to Achieve Goals: Good ADL Goals Pt Will Perform Lower Body Dressing: with set-up;sit to/from stand;with adaptive equipment;with caregiver independent in assisting Pt Will Transfer to Toilet: with modified independence;ambulating Pt Will Perform Tub/Shower Transfer: rolling walker;ambulating;Shower transfer;with supervision;3 in 1  OT Frequency: Min 2X/week   Barriers to D/C:            Co-evaluation              End of Session Equipment Utilized During Treatment: Gait belt;Rolling walker Nurse Communication: Mobility status  Activity Tolerance: Patient tolerated treatment well Patient left: in chair;with call bell/phone within reach;with family/visitor present   Time: 1225-8346 OT Time Calculation (min): 12 min Charges:  OT General Charges $OT Visit: 1 Procedure OT Evaluation $Initial OT Evaluation Tier I: 1 Procedure G-CodesBenito Mccreedy OTR/L [id number] 03/04/2015, 2:58 PM

## 2015-03-04 NOTE — Evaluation (Signed)
Physical Therapy Evaluation Patient Details Name: [name] Rose MRN: [id number] DOB: 10-Sep-1950 Today's Date: 03/04/2015   History of Present Illness  64 y.o. male admitted to [facility] on 03/03/15 for elective L THA direct anterior approach.  Post-op course complicated by anemia and hypokalemia.  2units PRBCs given as well as IV potassium. Pt with significant PMHx of HTN, multiple back surgeries, and bil TKA.    Clinical Impression  Bed level evaluation initiated and in-bed exercises completed.  Hip education packet given and verbally reviewed with pt/wife.  Pt is limited this AM due to very low Hgb and low potassium and calcium levels.  Pt is getting IV supplementation of electrolytes and is mid way through his first of two units of blood.  PT will get pt up and mobilize as tolerated this PM after second unit of blood runs (per PA instructions). I would like to anticipate    Follow Up Recommendations Home health PT;Supervision for mobility/OOB    Equipment Recommendations  None recommended by PT    Recommendations for Other Services   NA    Precautions / Restrictions Precautions Precautions: None Restrictions Weight Bearing Restrictions: Yes LLE Weight Bearing: Weight bearing as tolerated             Pertinent Vitals/Pain Pain Assessment: 0-10 Pain Score: 4  Pain Location: left hip Pain Descriptors / Indicators: Aching;Burning Pain Intervention(s): Limited activity within patient's tolerance;Monitored during session;Repositioned    Home Living Family/patient expects to be discharged to:: Private residence Living Arrangements: Spouse/significant other Available Help at Discharge: Family;Available 24 hours/day Type of Home: House Home Access: Stairs to enter Entrance Stairs-Rails: None Entrance Stairs-Number of Steps: 1 Home Layout: One level Home Equipment: Walker - 2 wheels;Cane - single point;Bedside commode      Prior Function Level of Independence: Independent               Hand Dominance   Dominant Hand: Right    Extremity/Trunk Assessment   Upper Extremity Assessment: Defer to OT evaluation           Lower Extremity Assessment: LLE deficits/detail   LLE Deficits / Details: left leg with normal post op pain and weakness, ankle at least 3/5, knee, 3-/5, hip 2/5  Cervical / Trunk Assessment: Other exceptions  Communication   Communication: No difficulties  Cognition Arousal/Alertness: Lethargic;Suspect due to medications Behavior During Therapy: [facility] for tasks assessed/performed Overall Cognitive Status: Within Functional Limits for tasks assessed                         Exercises Total Joint Exercises Ankle Circles/Pumps: AROM;Both;20 reps;Supine Quad Sets: AROM;Left;10 reps;Supine Short Arc Quad: AROM;Left;10 reps;Supine Heel Slides: AAROM;Left;10 reps;Supine Hip ABduction/ADduction: Both;10 reps;Supine;AAROM      Assessment/Plan    PT Assessment Patient needs continued PT services  PT Diagnosis Difficulty walking;Abnormality of gait;Generalized weakness;Acute pain   PT Problem List Decreased strength;Decreased activity tolerance;Decreased range of motion;Decreased balance;Decreased mobility;Decreased knowledge of use of DME;Decreased knowledge of precautions;Pain  PT Treatment Interventions DME instruction;Gait training;Stair training;Functional mobility training;Therapeutic activities;Therapeutic exercise;Balance training;Neuromuscular re-education;Patient/family education;Modalities;Manual techniques   PT Goals (Current goals can be found in the Care Plan section) Acute Rehab PT Goals Patient Stated Goal: to get back to playing golf and hockey PT Goal Formulation: With patient/family Time For Goal Achievement: 03/18/15 Potential to Achieve Goals: Good    Frequency 7X/week           End of Session   Activity Tolerance: Treatment  limited secondary to medical complications (Comment) (limited by low HGb  and abnormal electrolytes) Patient left: in bed;with call bell/phone within reach;with family/visitor present           Time: 6861-6837 PT Time Calculation (min) (ACUTE ONLY): 18 min   Charges:   PT Evaluation $Initial PT Evaluation Tier I: 1 Procedure          Denajah Farias B. Vayda Dungee, PT, DPT [phone]   03/04/2015, 11:58 AM

## 2015-03-04 NOTE — Progress Notes (Signed)
Physical Therapy Treatment Patient Details Name: [name] Rose MRN: [id number] DOB: [birth year] Today's Date: 03/04/2015    History of Present Illness 64 y.o. male admitted to [facility] on 03/03/15 for elective L THA direct anterior approach.  Post-op course complicated by anemia and hypokalemia.  2units PRBCs given as well as IV potassium. Pt with significant PMHx of HTN, multiple back surgeries, and bil TKA.      PT Comments    Mobility assessment completed during PM session and pt mobilizing well.  Not symptomatic in standing or while walking.  Min guard assist for most mobility.  He is progressing well despite slow start to his post op course.    Follow Up Recommendations  Home health PT;Supervision for mobility/OOB     Equipment Recommendations  None recommended by PT    Recommendations for Other Services   NA     Precautions / Restrictions Precautions Precautions: Fall Restrictions Weight Bearing Restrictions: Yes LLE Weight Bearing: Weight bearing as tolerated    Mobility  Bed Mobility Overal bed mobility: Needs Assistance Bed Mobility: Supine to Sit     Supine to sit: Min assist     General bed mobility comments: NT- Pt OOB in recliner chair  Transfers Overall transfer level: Needs assistance Equipment used: Rolling walker (2 wheeled) Transfers: Sit to/from Stand Sit to Stand: Supervision;Min guard         General transfer comment: supervision for safety when going to stand from low recliner chair.  Verbal cues for safe hand placement and slow descent to sit.  Sit to stand required min guard to help encourage slow descent to sit.   Ambulation/Gait Ambulation/Gait assistance: Min guard Ambulation Distance (Feet): 100 Feet Assistive device: Rolling walker (2 wheeled) Gait Pattern/deviations: Step-through pattern;Antalgic;Trunk flexed     General Gait Details: RW adjusted up for height with good improvement in posture with this change.  Continued verbal cues  for upright posture.  Wife reports he like to look at his feet and did with his bil TKAs.            Balance Overall balance assessment: Needs assistance Sitting-balance support: Feet supported;No upper extremity supported Sitting balance-Leahy Scale: Good     Standing balance support: Bilateral upper extremity supported Standing balance-Leahy Scale: Fair                      Cognition Arousal/Alertness: Awake/alert Behavior During Therapy: WFL for tasks assessed/performed Overall Cognitive Status: Within Functional Limits for tasks assessed                      Exercises Total Joint Exercises Ankle Circles/Pumps: AROM;Both;20 reps;Supine Quad Sets: AROM;Left;10 reps;Supine Short Arc Quad: AROM;Left;10 reps;Supine Heel Slides: AAROM;Left;10 reps;Supine Hip ABduction/ADduction: Both;10 reps;Supine;AAROM Long Arc Quad: AROM;Left;10 reps;Seated        Pertinent Vitals/Pain Pain Assessment: 0-10 Pain Score: 6  Pain Location: left leg Pain Descriptors / Indicators: Aching Pain Intervention(s): Limited activity within patient's tolerance;Monitored during session;Premedicated before session;Repositioned    Home Living Family/patient expects to be discharged to:: Private residence Living Arrangements: Spouse/significant other Available Help at Discharge: Family;Available 24 hours/day Type of Home: House Home Access: Stairs to enter Entrance Stairs-Rails: None Home Layout: One level Home Equipment: Environmental consultant - 2 wheels;Cane - single point;Bedside commode;Adaptive equipment      Prior Function Level of Independence: Independent          PT Goals (current goals can now be found in the care plan  section) Acute Rehab PT Goals Patient Stated Goal: go home Progress towards PT goals: Progressing toward goals    Frequency  7X/week    PT Plan Current plan remains appropriate       End of Session Equipment Utilized During Treatment: Gait belt Activity  Tolerance: Patient limited by pain Patient left: in chair;with call bell/phone within reach;with family/visitor present     Time: 1638-4665 PT Time Calculation (min) (ACUTE ONLY): 22 min  Charges:  $Gait Training: 8-22 mins                      [name] Rose, PT, DPT [phone]   03/04/2015, 5:28 PM

## 2015-03-05 LAB — TYPE AND SCREEN
ABO/RH(D): A POS
Antibody Screen: NEGATIVE
Unit division: 0
Unit division: 0
Unit division: 0
Unit division: 0

## 2015-03-05 LAB — CBC
HCT: 29.6 % — ABNORMAL LOW (ref 39.0–52.0)
Hemoglobin: 10 g/dL — ABNORMAL LOW (ref 13.0–17.0)
MCH: 29.6 pg (ref 26.0–34.0)
MCHC: 33.8 g/dL (ref 30.0–36.0)
MCV: 87.6 fL (ref 78.0–100.0)
Platelets: 128 10*3/uL — ABNORMAL LOW (ref 150–400)
RBC: 3.38 MIL/uL — ABNORMAL LOW (ref 4.22–5.81)
RDW: 13.2 % (ref 11.5–15.5)
WBC: 11.5 10*3/uL — ABNORMAL HIGH (ref 4.0–10.5)

## 2015-03-05 NOTE — Discharge Summary (Signed)
Patient ID: [name] Rose MRN: [id number] DOB/AGE: [birth year] 64 y.o.  Admit date: 03/03/2015 Discharge date: 03/05/2015  Admission Diagnoses:  Principal Problem:   Primary osteoarthritis of left hip Active Problems:   Postoperative anemia due to acute blood loss   Hypokalemia   Discharge Diagnoses:  Same  Past Medical History  Diagnosis Date  . Hypertension   . Arthritis     Surgeries: Procedure(s): LEFT TOTAL HIP ARTHROPLASTY ANTERIOR APPROACH on 03/03/2015   Discharged Condition: Improved  Hospital Course: [name] Rose is an 64 y.o. male who was admitted 03/03/2015 for operative treatment ofPrimary osteoarthritis of left hip. Patient has severe unremitting pain that affects sleep, daily activities, and work/hobbies. After pre-op clearance the patient was taken to the operating room on 03/03/2015 and underwent  Procedure(s): LEFT TOTAL HIP ARTHROPLASTY ANTERIOR APPROACH.    Patient was given perioperative antibiotics: Anti-infectives    Start     Dose/Rate Route Frequency Ordered Stop   03/03/15 2000  ceFAZolin (ANCEF) IVPB 2 g/50 mL premix     2 g 100 mL/hr over 30 Minutes Intravenous Every 6 hours 03/03/15 1929 03/04/15 0210   03/03/15 1230  ceFAZolin (ANCEF) 3 g in dextrose 5 % 50 mL IVPB     3 g 160 mL/hr over 30 Minutes Intravenous To ShortStay Surgical 02/28/15 1420 03/03/15 1350       Patient was given sequential compression devices, early ambulation, and chemoprophylaxis to prevent DVT. On postoperative day #1 the patient had symptomatic acute blood loss anemia. His hemoglobin was 6.8. 2 units of packed RBCs were transfused ringing him up to a hemoglobin of 10.0 on postop day #2. Hematocrit was 29.6. He also had decreased potassium at 2.6. Several runs of IV potassium are given as well as IV potassium added to his IV fluid. His potassium later on postop day #2 after the blood transfusion was 3.9. On postoperative day #2 the patient was afebrile his vital  signs are stable. He was having moderate left hip pain relieved with oral pain medication. He was seen by physical therapy and felt safe to be discharged home.  Patient benefited maximally from hospital stay and there were no complications.    Recent vital signs: Patient Vitals for the past 24 hrs:  BP Temp Temp src Pulse Resp SpO2  03/05/15 0556 (!) 149/79 mmHg 99.5 F (37.5 C) Oral 82 18 100 %  03/04/15 2036 (!) 165/80 mmHg 99.6 F (37.6 C) Oral 98 18 97 %  03/04/15 1823 128/64 mmHg (!) 100.9 F (38.3 C) Oral 84 18 -  03/04/15 1600 122/65 mmHg 99.4 F (37.4 C) Oral 88 18 96 %  03/04/15 1547 122/60 mmHg (!) 100.6 F (38.1 C) Oral 91 16 97 %  03/04/15 1140 (!) 114/58 mmHg - - 81 - 96 %  03/04/15 1015 111/68 mmHg 98.5 F (36.9 C) Oral 85 - 100 %  03/04/15 1000 (!) 110/57 mmHg 99 F (37.2 C) Oral 84 18 100 %     Recent laboratory studies:  Recent Labs  03/04/15 0545 03/04/15 1915 03/05/15 0549  WBC 8.6  --  11.5*  HGB 6.8*  --  10.0*  HCT 20.5*  --  29.6*  PLT 114*  --  128*  NA 141 137  --   K 2.6* 3.9  --   CL 114* 103  --   CO2 20* 28  --   BUN 13 16  --   CREATININE 0.87 1.16  --   GLUCOSE 97  125*  --   CALCIUM 5.8* 8.2*  --      Discharge Medications:     Medication List    STOP taking these medications        acetaminophen 500 MG tablet  Commonly known as:  TYLENOL     ALEVE 220 MG tablet  Generic drug:  naproxen sodium      TAKE these medications        amLODipine 5 MG tablet  Commonly known as:  NORVASC  Take 5 mg by mouth daily.     aspirin EC 325 MG tablet  Take 1 tablet (325 mg total) by mouth 2 (two) times daily after a meal. Take x 1 month post op to decrease risk of blood clots.     bisoprolol-hydrochlorothiazide 10-6.25 MG tablet  Commonly known as:  ZIAC  Take 1 tablet by mouth daily.     losartan 100 MG tablet  Commonly known as:  COZAAR  Take 100 mg by mouth daily.     methocarbamol 750 MG tablet  Commonly known as:   ROBAXIN-750  Take 1 tablet (750 mg total) by mouth every 8 (eight) hours as needed for muscle spasms.     oxyCODONE-acetaminophen 5-325 MG tablet  Commonly known as:  PERCOCET/ROXICET  Take 1-2 tablets by mouth every 6 (six) hours as needed for severe pain.       the patient is also instructed to take one ferrous sulfate tablet 325 mg daily 3 weeks postop. He will buy this over-the-counter.  Diagnostic Studies: Dg Chest 2 View  02/26/2015  CLINICAL DATA:  Pre-admission study prior to left total hip arthroplasty, no current complaints, history hypertension and of tobacco use in the past. EXAM: CHEST  2 VIEW COMPARISON:  PA and lateral chest x-ray dated August 07, 2009 FINDINGS: The lungs are adequately inflated. There is no focal infiltrate. There is no pleural effusion. The heart and pulmonary vascularity are normal. The mediastinum is normal in width. There is mild tortuosity of the descending thoracic aorta. There is mild multilevel degenerative disc disease of the thoracic spine. IMPRESSION: There is no active cardiopulmonary disease. Electronically Signed   By: David  [place] M.D.   On: 02/26/2015 11:15   Dg Hip Port Unilat With Pelvis 1v Left  03/03/2015  CLINICAL DATA:  Immediate postop left total hip arthroplasty. EXAM: DG HIP (WITH OR WITHOUT PELVIS) 1V PORT LEFT 1938 hr: COMPARISON:  Intraoperative AP pelvis x-ray earlier same date 1610 hr. Left hip MRI 08/26/2014. FINDINGS: Anatomic alignment in the AP projection post left total hip arthroplasty. No acute complicating features. Mild joint space narrowing in the contralateral right hip. Mild degenerative changes involving the visualized sacroiliac joints. IMPRESSION: Anatomic alignment in the AP projection post left total hip arthroplasty without acute complicating features. Electronically Signed   By: Hulan Saas M.D.   On: 03/03/2015 19:52   Dg Hip Operative Unilat With Pelvis Left  03/03/2015  CLINICAL DATA:  Left hip  arthroplasty. EXAM: OPERATIVE LEFT HIP (WITH PELVIS IF PERFORMED) 2 VIEWS TECHNIQUE: Fluoroscopic spot image(s) were submitted for interpretation post-operatively. COMPARISON:  None. FINDINGS: The 2 images show a total left hip arthroplasty. The femoral and acetabular components appear well seated and aligned on the submitted images. There is no acute fracture or evidence of an operative complication. IMPRESSION: Well aligned left hip arthroplasty. Electronically Signed   By: Amie Portland M.D.   On: 03/03/2015 16:50    Disposition:  home with home health physical therapy.  Discharge Instructions    Call MD / Call 911    Complete by:  As directed   If you experience chest pain or shortness of breath, CALL 911 and be transported to the hospital emergency room.  If you develope a fever above 101 F, pus (white drainage) or increased drainage or redness at the wound, or calf pain, call your surgeon's office.     Constipation Prevention    Complete by:  As directed   Drink plenty of fluids.  Prune juice may be helpful.  You may use a stool softener, such as Colace (over the counter) 100 mg twice a day.  Use MiraLax (over the counter) for constipation as needed.     Diet general    Complete by:  As directed      Do not sit on low chairs, stoools or toilet seats, as it may be difficult to get up from low surfaces    Complete by:  As directed      Follow the hip precautions as taught in Physical Therapy    Complete by:  As directed      Increase activity slowly as tolerated    Complete by:  As directed      Weight bearing as tolerated    Complete by:  As directed   Laterality:  left  Extremity:  Lower           Follow-up Information    Follow up with GRAVES,Justus L, MD. Schedule an appointment as soon as possible for a visit in 2 weeks.   Specialty:  Orthopedic Surgery   Contact information:   [place] [address] [phone]        Signed: Erlene Senters 03/05/2015, 8:33 AM

## 2015-03-05 NOTE — Progress Notes (Signed)
Occupational Therapy Treatment Patient Details Name: [name] Rose MRN: [id number] DOB: 1951-03-08 Today's Date: 03/05/2015    History of present illness 64 y.o. male admitted to [facility] on 03/03/15 for elective L THA direct anterior approach.  Post-op course complicated by anemia and hypokalemia.  2units PRBCs given as well as IV potassium. Pt with significant PMHx of HTN, multiple back surgeries, and bil TKA.     OT comments  Pt making good progress toward OT goals. Educated and demonstrated walk in shower transfer; pt able to return demonstrate understanding. Provided with walk in shower transfer handout. Pt reports requiring minimal help with dressing this AM; wife able to provide assist. Reviewed compensatory strategies for LB ADLs and educated on need for close supervision during ADLs and mobility at this time; pt and wife verbalize understanding. Pt ready to d/c from an OT standpoint. Will continue to follow acutely.   Follow Up Recommendations  No OT follow up;Supervision - Intermittent    Equipment Recommendations  None recommended by OT    Recommendations for Other Services      Precautions / Restrictions Precautions Precautions: Fall Restrictions Weight Bearing Restrictions: Yes LLE Weight Bearing: Weight bearing as tolerated       Mobility Bed Mobility               General bed mobility comments: Pt OOB in chair   Transfers Overall transfer level: Needs assistance Equipment used: Rolling walker (2 wheeled) Transfers: Sit to/from Stand Sit to Stand: Supervision         General transfer comment: Supervision for safety. Good hand placement and technique    Balance Overall balance assessment: Needs assistance         Standing balance support: Bilateral upper extremity supported Standing balance-Leahy Scale: Fair Standing balance comment: RW for support                   ADL Overall ADL's : Needs assistance/impaired                        Lower Body Dressing Details (indicate cue type and reason): Pt reports that his wife had to help this AM for LB dressing. Reviewed compensatory strategies for LB ADLs; pt and wife verbalize understanding.         Tub/ Shower Transfer: Min guard;Walk-in shower;Ambulation;Rolling walker Tub/Shower Transfer Details (indicate cue type and reason): Educated and demonstrated walk in shower transfer technique; pt able to return demonstrate technique. Provided pt and wife with shower transfer handout. Educated on need for close supervision during ADLs and mobility; pt and wife verbalize understanding.          Vision                     Perception     Praxis      Cognition   Behavior During Therapy: [facility] for tasks assessed/performed Overall Cognitive Status: Within Functional Limits for tasks assessed                       Extremity/Trunk Assessment               Exercises Total Joint Exercises Hip ABduction/ADduction: AROM;Left;10 reps;Standing (abduction) Knee Flexion: AROM;Left;10 reps;Standing Marching in Standing: AROM;Left;10 reps;Standing Standing Hip Extension: AROM;10 reps;Standing   Shoulder Instructions       General Comments      Pertinent Vitals/ Pain       Pain Assessment:  Faces Pain Score: 4  Faces Pain Scale: Hurts a little bit Pain Location: L hip Pain Descriptors / Indicators: Sore Pain Intervention(s): Limited activity within patient's tolerance;Monitored during session  Home Living                                          Prior Functioning/Environment              Frequency Min 2X/week     Progress Toward Goals  OT Goals(current goals can now be found in the care plan section)  Progress towards OT goals: Progressing toward goals  Acute Rehab OT Goals Patient Stated Goal: to go home today OT Goal Formulation: With patient  Plan Discharge plan remains appropriate    Co-evaluation                  End of Session Equipment Utilized During Treatment: Rolling walker   Activity Tolerance Patient tolerated treatment well   Patient Left in chair;with call bell/phone within reach;with family/visitor present   Nurse Communication          Time: 1041-1051 OT Time Calculation (min): 10 min  Charges: OT General Charges $OT Visit: 1 Procedure OT Treatments $Self Care/Home Management : 8-22 mins  Binnie Kand M.S., OTR/L Pager: [phone]  03/05/2015, 10:56 AM

## 2015-03-05 NOTE — Progress Notes (Signed)
[name] Rose discharged home per MD order. Discharge instructions reviewed and discussed with patient. All questions and concerns answered. Copy of instructions and scripts given to patient. IV removed.  Patient escorted to car by staff in a wheelchair. No distress noted upon discharge.   Tarri Abernethy R 03/05/2015 11:05 AM

## 2015-03-05 NOTE — Progress Notes (Signed)
Physical Therapy Treatment Patient Details Name: [name] Rose MRN: [id number] DOB: [birth year] Today's Date: 03/05/2015    History of Present Illness 64 y.o. male admitted to [facility] on 03/03/15 for elective L THA direct anterior approach.  Post-op course complicated by anemia and hypokalemia.  2units PRBCs given as well as IV potassium. Pt with significant PMHx of HTN, multiple back surgeries, and bil TKA.      PT Comments    Pt in good spirits this morning for therapy. Pt was able to complete standing HEP exercises with mention of slight tightness in left hip during abduction and knee flexion; pt looked observably fatigued and pt did state he was "pretty tired" after his performing his exercises. Pt safely performed stair training to prepare for discharging home.   Follow Up Recommendations  Home health PT;Supervision for mobility/OOB     Equipment Recommendations  None recommended by PT       Precautions / Restrictions Restrictions Weight Bearing Restrictions: Yes LLE Weight Bearing: Weight bearing as tolerated    Mobility                 Transfers Overall transfer level: Needs assistance Equipment used: Rolling walker (2 wheeled) Transfers: Sit to/from Stand Sit to Stand: Supervision         General transfer comment: pt required supervision for safety when standing from recliner.   Ambulation/Gait Ambulation/Gait assistance: Supervision Ambulation Distance (Feet): 100 Feet Assistive device: Rolling walker (2 wheeled) Gait Pattern/deviations: Step-through pattern;Trunk flexed Gait velocity: decreased Gait velocity interpretation: <1.8 ft/sec, indicative of risk for recurrent falls  General gait comments: Pt required supervision during gait training to maintain pt's safety and balance. Pt was able to gait train requiring verbal cuing to maitain upright posture, look forward and relax shoulders.   Stairs Stairs: Yes Stairs assistance: Min guard (to block  walker) Stair Management: No rails;Forwards;With walker Number of Stairs: 1 (x 2 trials) General stair comments: Verbal cuing for sequencing of BLE. Wife instructed to stabilize walker when ascending and descending stair.            Cognition Arousal/Alertness: Awake/alert Behavior During Therapy: WFL for tasks assessed/performed Overall Cognitive Status: Within Functional Limits for tasks assessed                      Exercises Total Joint Exercises Hip ABduction/ADduction: AROM;Left;10 reps;Standing (abduction) Knee Flexion: AROM;Left;10 reps;Standing Marching in Standing: AROM;Left;10 reps;Standing Standing Hip Extension: AROM;10 reps;Standing        Pertinent Vitals/Pain Pain Assessment: 0-10 Pain Score: 4  Pain Location: left hip Pain Descriptors / Indicators: Aching Pain Intervention(s): Monitored during session           PT Goals (current goals can now be found in the care plan section) Acute Rehab PT Goals Patient Stated Goal: go home Progress towards PT goals: Progressing toward goals    Frequency  7X/week    PT Plan Current plan remains appropriate       End of Session Equipment Utilized During Treatment: Gait belt Activity Tolerance: Patient tolerated treatment well Patient left: in chair;with call bell/phone within reach;with family/visitor present     Time: 2440-1027 PT Time Calculation (min) (ACUTE ONLY): 21 min  Charges:   1 Gait                      Sundra Aland, [place] OFFICE   03/05/2015, 10:18 AM

## 2015-03-05 NOTE — Progress Notes (Addendum)
Subjective: 2 Days Post-Op Procedure(s) (LRB): LEFT TOTAL HIP ARTHROPLASTY ANTERIOR APPROACH (Left) Patient reports pain as moderate.  Taking by mouth and voiding okay. Positive flatus. Did fairly well with physical therapy yesterday. Will be ready to go home today after this morning's therapy session.  Objective: Vital signs in last 24 hours: Temp:  [98.5 F (36.9 C)-100.9 F (38.3 C)] 99.5 F (37.5 C) (11/23 0556) Pulse Rate:  [81-98] 82 (11/23 0556) Resp:  [16-18] 18 (11/23 0556) BP: (110-165)/(57-80) 149/79 mmHg (11/23 0556) SpO2:  [96 %-100 %] 100 % (11/23 0556)  Intake/Output from previous day: 11/22 0701 - 11/23 0700 In: 383.5 [Blood:383.5] Out: 1594 [Urine:1075] Intake/Output this shift:     Recent Labs  03/03/15 1453 03/03/15 1728 03/03/15 1734 03/04/15 0545 03/05/15 0549  HGB 12.9* 11.1* 10.5* 6.8* 10.0*    Recent Labs  03/04/15 0545 03/05/15 0549  WBC 8.6 11.5*  RBC 2.33* 3.38*  HCT 20.5* 29.6*  PLT 114* 128*    Recent Labs  03/04/15 0545 03/04/15 1915  NA 141 137  K 2.6* 3.9  CL 114* 103  CO2 20* 28  BUN 13 16  CREATININE 0.87 1.16  GLUCOSE 97 125*  CALCIUM 5.8* 8.2*   No results for input(s): LABPT, INR in the last 72 hours. Left hip exam: Neurovascular intact Sensation intact distally Intact pulses distally Dorsiflexion/Plantar flexion intact Incision: dressing C/D/I No cellulitis present Compartment soft  Assessment/Plan: 2 Days Post-Op Procedure(s) (LRB): LEFT TOTAL HIP ARTHROPLASTY ANTERIOR APPROACH (Left) Acute blood loss anemia. Resolved after transfusion of 2 units packed RBCs. Hypokalemia, resolved.  Plan: Up with therapy Discharge home with home health after therapy this morning. I will have him take 1 over-the-counter iron supplement 325 mg daily 3 weeks. Aspirin 325 mg enteric-coated twice daily 1 month for DVT prophylaxis. Weight-bear as tolerated on the left. Follow-up with Dr. Berenice Primas in 2 weeks.    [name] Rose 03/05/2015, 8:28 AM

## 2015-03-10 NOTE — Anesthesia Postprocedure Evaluation (Signed)
Anesthesia Post Note  Patient: [name] Rose  Procedure(s) Performed: Procedure(s) (LRB): LEFT TOTAL HIP ARTHROPLASTY ANTERIOR APPROACH (Left)  Patient location during evaluation: PACU Anesthesia Type: General Level of consciousness: awake Pain management: pain level controlled Vital Signs Assessment: post-procedure vital signs reviewed and stable Respiratory status: spontaneous breathing Cardiovascular status: blood pressure returned to baseline Anesthetic complications: no    Last Vitals:  Filed Vitals:   03/04/15 2036 03/05/15 0556  BP: 165/80 149/79  Pulse: 98 82  Temp: 37.6 C 37.5 C  Resp: 18 18    Last Pain:  Filed Vitals:   03/05/15 1113  PainSc: 4     LLE Motor Response: Purposeful movement, Responds to commands LLE Sensation: Full sensation          EDWARDS,Stephan Nelis

## 2016-08-16 DIAGNOSIS — L570 Actinic keratosis: Secondary | ICD-10-CM | POA: Diagnosis not present

## 2016-08-16 DIAGNOSIS — L814 Other melanin hyperpigmentation: Secondary | ICD-10-CM | POA: Diagnosis not present

## 2016-08-16 DIAGNOSIS — D1800 Hemangioma unspecified site: Secondary | ICD-10-CM | POA: Diagnosis not present

## 2016-08-16 DIAGNOSIS — D225 Melanocytic nevi of trunk: Secondary | ICD-10-CM | POA: Diagnosis not present

## 2016-08-16 DIAGNOSIS — Z85828 Personal history of other malignant neoplasm of skin: Secondary | ICD-10-CM | POA: Diagnosis not present

## 2016-08-16 DIAGNOSIS — L821 Other seborrheic keratosis: Secondary | ICD-10-CM | POA: Diagnosis not present

## 2016-08-20 DIAGNOSIS — R319 Hematuria, unspecified: Secondary | ICD-10-CM | POA: Diagnosis not present

## 2016-08-30 DIAGNOSIS — Z8546 Personal history of malignant neoplasm of prostate: Secondary | ICD-10-CM | POA: Diagnosis not present

## 2016-08-30 DIAGNOSIS — R310 Gross hematuria: Secondary | ICD-10-CM | POA: Diagnosis not present

## 2016-09-02 DIAGNOSIS — Z86010 Personal history of colonic polyps: Secondary | ICD-10-CM | POA: Diagnosis not present

## 2016-09-03 DIAGNOSIS — R310 Gross hematuria: Secondary | ICD-10-CM | POA: Diagnosis not present

## 2016-09-13 DIAGNOSIS — R310 Gross hematuria: Secondary | ICD-10-CM | POA: Diagnosis not present

## 2016-09-14 ENCOUNTER — Other Ambulatory Visit: Payer: Self-pay | Admitting: Urology

## 2016-09-14 ENCOUNTER — Encounter (HOSPITAL_BASED_OUTPATIENT_CLINIC_OR_DEPARTMENT_OTHER): Payer: Self-pay | Admitting: *Deleted

## 2016-09-14 DIAGNOSIS — T83098A Other mechanical complication of other indwelling urethral catheter, initial encounter: Secondary | ICD-10-CM | POA: Insufficient documentation

## 2016-09-14 DIAGNOSIS — Z8546 Personal history of malignant neoplasm of prostate: Secondary | ICD-10-CM | POA: Diagnosis not present

## 2016-09-14 DIAGNOSIS — R310 Gross hematuria: Secondary | ICD-10-CM | POA: Diagnosis not present

## 2016-09-14 DIAGNOSIS — Z87891 Personal history of nicotine dependence: Secondary | ICD-10-CM | POA: Insufficient documentation

## 2016-09-14 DIAGNOSIS — I10 Essential (primary) hypertension: Secondary | ICD-10-CM | POA: Diagnosis not present

## 2016-09-14 DIAGNOSIS — Z79899 Other long term (current) drug therapy: Secondary | ICD-10-CM | POA: Insufficient documentation

## 2016-09-14 DIAGNOSIS — R339 Retention of urine, unspecified: Secondary | ICD-10-CM | POA: Diagnosis not present

## 2016-09-14 DIAGNOSIS — R319 Hematuria, unspecified: Secondary | ICD-10-CM | POA: Diagnosis not present

## 2016-09-14 DIAGNOSIS — Y738 Miscellaneous gastroenterology and urology devices associated with adverse incidents, not elsewhere classified: Secondary | ICD-10-CM | POA: Insufficient documentation

## 2016-09-14 NOTE — Progress Notes (Signed)
   09/14/16 1329  OBSTRUCTIVE SLEEP APNEA  Have you ever been diagnosed with sleep apnea through a sleep study? No  Do you snore loudly (loud enough to be heard through closed doors)?  1  Do you often feel tired, fatigued, or sleepy during the daytime (such as falling asleep during driving or talking to someone)? 0  Has anyone observed you stop breathing during your sleep? 1  Do you have, or are you being treated for high blood pressure? 1  BMI more than 35 kg/m2? 0  Age > 76 (1-yes) 1  Male Gender (Yes=1) 1  Obstructive Sleep Apnea Score 5  Score 5 or greater  Results sent to PCP

## 2016-09-14 NOTE — Progress Notes (Signed)
NPO AFTER MN.  ARRIVE AT 0600.  NEEDS ISTAT 8 AND EKG.  WILL TAKE NORVASC AND COZAAR AM DOS W/ SIPS OF WATER.

## 2016-09-15 ENCOUNTER — Encounter (HOSPITAL_COMMUNITY): Payer: Self-pay | Admitting: Emergency Medicine

## 2016-09-15 ENCOUNTER — Emergency Department (HOSPITAL_COMMUNITY)
Admission: EM | Admit: 2016-09-15 | Discharge: 2016-09-15 | Disposition: A | Discharge status: Home / Self Care | Payer: PPO | Attending: Emergency Medicine | Admitting: Emergency Medicine

## 2016-09-15 DIAGNOSIS — R339 Retention of urine, unspecified: Secondary | ICD-10-CM

## 2016-09-15 DIAGNOSIS — T83091A Other mechanical complication of indwelling urethral catheter, initial encounter: Secondary | ICD-10-CM

## 2016-09-15 LAB — URINALYSIS, ROUTINE W REFLEX MICROSCOPIC
Bilirubin Urine: NEGATIVE
Glucose, UA: NEGATIVE mg/dL
Ketones, ur: NEGATIVE mg/dL
Leukocytes, UA: NEGATIVE
Nitrite: NEGATIVE
Protein, ur: 30 mg/dL — AB
Specific Gravity, Urine: 1.012 (ref 1.005–1.030)
pH: 6 (ref 5.0–8.0)

## 2016-09-15 LAB — URINALYSIS, MICROSCOPIC (REFLEX)

## 2016-09-15 NOTE — ED Provider Notes (Signed)
[facility] DEPT Provider Note   CSN: [id number] Arrival date & time: 09/14/16  2340  By signing my name below, I, Ny'Kea Lewis, attest that this documentation has been prepared under the direction and in the presence of Ripley Fraise, MD. Electronically Signed: Lise Auer, ED Scribe. 09/15/16. 1:56 AM.  History   Chief Complaint Chief Complaint  Patient presents with  . Urinary Retention   HPI HPI Comments: [name] Rose is a 66 y.o. male with a PMHx of bladder tumor, prostate cancer, and HTN who presents to the Emergency Department complaining of urinary retention that began today. Pt notes associated hematuria. Today pt had a catheter placed due to urinary issues the past two weeks. He was previously unable to void and notes after catheter placement it was discovered he had blood clots in this bladder. Yesterday morning upon awaking around 4 am he noticed blood in his urine. At 4 pm in the afternoon, pt reports he was unable to void and a catheter was placed and he was voiding without issues until later later tonight when the same issue returned. He reports that earlier when his catheter was placed there were blood clots present which was causing his retention which he believes is the current issue. Not currently on anticoagulants. Denies fever, nausea, vomiting, back pain, or abdominal pain.   The history is provided by the patient. No language interpreter was used.   Past Medical History:  Diagnosis Date  . Arthritis   . Bladder tumor   . History of prostate cancer followed by pcp dr [name]-  per pt last PSA undetectable   dx 2008-- (Stage T1c, Gleason 3+3,  PSA 4.58, vol 99cc)  s/p  radical prostatectomy (nerve sparing bilateral)   . Hypertension   . Lower urinary tract symptoms (LUTS)   . Pre-diabetes   . Wears glasses     Patient Active Problem List   Diagnosis Date Noted  . Postoperative anemia due to acute blood loss 03/04/2015  . Hypokalemia 03/04/2015  .  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 CATARACT EXTRACTION W/ INTRAOCULAR LENS  IMPLANT, BILATERAL Bilateral 2011  . [place]  . KNEE ARTHROSCOPY Bilateral right 2006;  left 02-15-2007  . [place];  1990;  1983  . ROBOT ASSISTED LAPAROSCOPIC RADICAL PROSTATECTOMY  06/20/2006   bilateral nerve sparing  . TOTAL HIP ARTHROPLASTY Left 03/03/2015   Procedure: LEFT TOTAL HIP ARTHROPLASTY ANTERIOR APPROACH;  Surgeon: Dorna Leitz, MD;  Location: [place];  Service: Orthopedics;  Laterality: Left;  . TOTAL KNEE ARTHROPLASTY Bilateral left 08-13-2009;  right 12-26-2009       Home Medications    Prior to Admission medications   Medication Sig Start Date End Date Taking? Authorizing Provider  amLODipine (NORVASC) 10 MG tablet Take 10 mg by mouth every morning.    [provider]  bisoprolol-hydrochlorothiazide (ZIAC) 10-6.25 MG tablet Take 1 tablet by mouth every evening.     [provider]  Cinnamon 500 MG TABS Take 2 tablets by mouth every morning.    [provider]  losartan (COZAAR) 100 MG tablet Take 100 mg by mouth every morning.     [provider]  MAGNESIUM PO Take 400 mg by mouth at bedtime. Leg cramps    [provider]  Naproxen Sodium (ALEVE) 220 MG CAPS Take by mouth as needed.    [provider]  Family History History reviewed. No pertinent family history.  Social History Social History  Substance Use Topics  . Smoking status: Former Smoker    Years: 16.00    Types: Cigarettes    Quit date: 09/25/1984  . Smokeless tobacco: Never Used  . Alcohol use Yes     Comment: occasionally     Allergies   Patient has no known allergies.   Review of Systems Review of Systems  Constitutional: Negative for fever.  Cardiovascular: Negative for chest pain.  Gastrointestinal: Negative for abdominal pain, nausea and  vomiting.  Genitourinary: Positive for decreased urine volume, difficulty urinating and hematuria.  Musculoskeletal: Negative for back pain.  All other systems reviewed and are negative.  Physical Exam Updated Vital Signs BP (!) 152/88 (BP Location: Left Arm)   Pulse 68   Temp 97.9 F (36.6 C) (Oral)   Resp 18   Ht $R'6\' 2"'XP$  (1.88 m)   Wt 273 lb (123.8 kg)   SpO2 99%   BMI 35.05 kg/m   Physical Exam CONSTITUTIONAL: Well developed/well nourished HEAD: Normocephalic/atraumatic ENMT: Mucous membranes moist NECK: supple no meningeal signs SPINE/BACK:entire spine nontender CV: S1/S2 noted, no murmurs/rubs/gallops noted LUNGS: Lungs are clear to auscultation bilaterally, no apparent distress ABDOMEN: soft, nontender GU:no cva tenderness, catheter in place, no bleeding or leaking around catheter, chaperone present for exam.  NEURO: Pt is awake/alert/appropriate, moves all extremitiesx4.  EXTREMITIES: pulses normal/equal, full ROM SKIN: warm, color normal PSYCH: no abnormalities of mood noted, alert and oriented to situation   ED Treatments / Results  DIAGNOSTIC STUDIES: Oxygen Saturation is 99% on RA, normal by my interpretation.   COORDINATION OF CARE: 1:40 AM-Discussed next steps with pt. Pt verbalized understanding and is agreeable with the plan.   Labs (all labs ordered are listed, but only abnormal results are displayed) Labs Reviewed  URINALYSIS, ROUTINE W REFLEX MICROSCOPIC - Abnormal; Notable for the following:       Result Value   Color, Urine RED (*)    APPearance HAZY (*)    Hgb urine dipstick LARGE (*)    Protein, ur 30 (*)    All other components within normal limits  URINALYSIS, MICROSCOPIC (REFLEX) - Abnormal; Notable for the following:    Bacteria, UA RARE (*)    Squamous Epithelial / LPF 0-5 (*)    All other components within normal limits  URINE CULTURE    EKG  EKG Interpretation None       Radiology No results  found.  Procedures Procedures (including critical care time)  Medications Ordered in ED Medications - No data to display   Initial Impression / Assessment and Plan / ED Course  I have reviewed the triage vital signs and the nursing notes.  Pertinent labs  results that were available during my care of the patient were reviewed by me and considered in my medical decision making (see chart for details).     Pt well appearing Recently found to have bladder tumor, scheduled to have TURBT later this month Urology placed catheter due to recent retention Apparently tonight his catheter was not draining due to clots It has been irrigated and now flowing He feels well Send urine for culture F/u with urology   Final Clinical Impressions(s) / ED Diagnoses   Final diagnoses:  Urinary retention  Obstruction of Foley catheter, initial encounter [facility])    New Prescriptions New Prescriptions   No medications on file  I personally performed the services described in this documentation, which was  scribed in my presence. The recorded information has been reviewed and is accurate.        Ripley Fraise, MD 09/15/16 [phone]

## 2016-09-15 NOTE — ED Triage Notes (Signed)
Pt states he has a bladder tumor and was scoped in the office yesterday morning  Pt states he got up at 4 am and noticed he had some blood in his urine  Pt state this afternoon around 4 he was unable to urinate so he went to the office and they placed a catheter  Pt states now the catheter is not draining

## 2016-09-15 NOTE — ED Notes (Signed)
Foley cath gently irrigated with NS  Several small clots noted on return Pt tolerated well

## 2016-09-16 LAB — URINE CULTURE: Culture: NO GROWTH

## 2016-09-20 ENCOUNTER — Ambulatory Visit (HOSPITAL_BASED_OUTPATIENT_CLINIC_OR_DEPARTMENT_OTHER): Payer: PPO | Admitting: Anesthesiology

## 2016-09-20 ENCOUNTER — Encounter (HOSPITAL_BASED_OUTPATIENT_CLINIC_OR_DEPARTMENT_OTHER): Payer: Self-pay | Admitting: Anesthesiology

## 2016-09-20 ENCOUNTER — Encounter (HOSPITAL_COMMUNITY)
Admission: RE | Disposition: A | Discharge status: Home / Self Care | Payer: Self-pay | Source: Ambulatory Visit | Attending: Urology

## 2016-09-20 ENCOUNTER — Observation Stay (HOSPITAL_BASED_OUTPATIENT_CLINIC_OR_DEPARTMENT_OTHER)
Admission: RE | Admit: 2016-09-20 | Discharge: 2016-09-21 | Disposition: A | Discharge status: Home / Self Care | Payer: PPO | Source: Ambulatory Visit | Attending: Urology | Admitting: Urology

## 2016-09-20 ENCOUNTER — Other Ambulatory Visit: Payer: Self-pay

## 2016-09-20 DIAGNOSIS — N3289 Other specified disorders of bladder: Secondary | ICD-10-CM | POA: Diagnosis not present

## 2016-09-20 DIAGNOSIS — M1612 Unilateral primary osteoarthritis, left hip: Secondary | ICD-10-CM | POA: Diagnosis not present

## 2016-09-20 DIAGNOSIS — Z87891 Personal history of nicotine dependence: Secondary | ICD-10-CM | POA: Diagnosis not present

## 2016-09-20 DIAGNOSIS — E876 Hypokalemia: Secondary | ICD-10-CM | POA: Diagnosis not present

## 2016-09-20 DIAGNOSIS — Z8546 Personal history of malignant neoplasm of prostate: Secondary | ICD-10-CM | POA: Diagnosis not present

## 2016-09-20 DIAGNOSIS — C671 Malignant neoplasm of dome of bladder: Principal | ICD-10-CM | POA: Diagnosis present

## 2016-09-20 DIAGNOSIS — Z96643 Presence of artificial hip joint, bilateral: Secondary | ICD-10-CM | POA: Diagnosis not present

## 2016-09-20 DIAGNOSIS — D494 Neoplasm of unspecified behavior of bladder: Secondary | ICD-10-CM | POA: Diagnosis not present

## 2016-09-20 DIAGNOSIS — I10 Essential (primary) hypertension: Secondary | ICD-10-CM | POA: Insufficient documentation

## 2016-09-20 HISTORY — DX: Prediabetes: R73.03

## 2016-09-20 HISTORY — DX: Personal history of malignant neoplasm of prostate: Z85.46

## 2016-09-20 HISTORY — DX: Unspecified symptoms and signs involving the genitourinary system: R39.9

## 2016-09-20 HISTORY — DX: Neoplasm of unspecified behavior of bladder: D49.4

## 2016-09-20 HISTORY — DX: Presence of spectacles and contact lenses: Z97.3

## 2016-09-20 HISTORY — PX: TRANSURETHRAL RESECTION OF BLADDER TUMOR: SHX2575

## 2016-09-20 LAB — POCT I-STAT, CHEM 8
BUN: 20 mg/dL (ref 6–20)
Calcium, Ion: 1.19 mmol/L (ref 1.15–1.40)
Chloride: 102 mmol/L (ref 101–111)
Creatinine, Ser: 1 mg/dL (ref 0.61–1.24)
Glucose, Bld: 110 mg/dL — ABNORMAL HIGH (ref 65–99)
HCT: 39 % (ref 39.0–52.0)
Hemoglobin: 13.3 g/dL (ref 13.0–17.0)
Potassium: 3.9 mmol/L (ref 3.5–5.1)
Sodium: 141 mmol/L (ref 135–145)
TCO2: 29 mmol/L (ref 0–100)

## 2016-09-20 SURGERY — TURBT (TRANSURETHRAL RESECTION OF BLADDER TUMOR)
Anesthesia: General

## 2016-09-20 MED ORDER — LOSARTAN POTASSIUM 50 MG PO TABS
100.0000 mg | ORAL_TABLET | Freq: Every morning | ORAL | Status: DC
  Administered 2016-09-21: 100 mg via ORAL
  Filled 2016-09-20: qty 2

## 2016-09-20 MED ORDER — LIDOCAINE 2% (20 MG/ML) 5 ML SYRINGE
INTRAMUSCULAR | Status: DC | PRN
  Administered 2016-09-20: 100 mg via INTRAVENOUS

## 2016-09-20 MED ORDER — FENTANYL CITRATE (PF) 100 MCG/2ML IJ SOLN
INTRAMUSCULAR | Status: DC | PRN
  Administered 2016-09-20: 25 ug via INTRAVENOUS
  Administered 2016-09-20 (×2): 50 ug via INTRAVENOUS
  Administered 2016-09-20 (×3): 25 ug via INTRAVENOUS

## 2016-09-20 MED ORDER — SULFAMETHOXAZOLE-TRIMETHOPRIM 800-160 MG PO TABS: 1.0000 | ORAL_TABLET | Freq: Two times a day (BID) | ORAL | 0 refills | Status: DC

## 2016-09-20 MED ORDER — METOCLOPRAMIDE HCL 5 MG/ML IJ SOLN
INTRAMUSCULAR | Status: DC | PRN
  Administered 2016-09-20: 10 mg via INTRAVENOUS

## 2016-09-20 MED ORDER — EPHEDRINE SULFATE 50 MG/ML IJ SOLN
INTRAMUSCULAR | Status: DC | PRN
  Administered 2016-09-20: 10 mg via INTRAVENOUS

## 2016-09-20 MED ORDER — PROPOFOL 10 MG/ML IV BOLUS
INTRAVENOUS | Status: AC
  Filled 2016-09-20: qty 40

## 2016-09-20 MED ORDER — SULFAMETHOXAZOLE-TRIMETHOPRIM 800-160 MG PO TABS
1.0000 | ORAL_TABLET | Freq: Two times a day (BID) | ORAL | Status: DC
  Administered 2016-09-20 – 2016-09-21 (×3): 1 via ORAL
  Filled 2016-09-20 (×3): qty 1

## 2016-09-20 MED ORDER — FENTANYL CITRATE (PF) 100 MCG/2ML IJ SOLN
INTRAMUSCULAR | Status: AC
  Filled 2016-09-20: qty 2

## 2016-09-20 MED ORDER — AMLODIPINE BESYLATE 10 MG PO TABS
10.0000 mg | ORAL_TABLET | Freq: Every morning | ORAL | Status: DC
  Administered 2016-09-21: 10 mg via ORAL
  Filled 2016-09-20: qty 1

## 2016-09-20 MED ORDER — PROMETHAZINE HCL 25 MG/ML IJ SOLN
6.2500 mg | INTRAMUSCULAR | Status: DC | PRN
  Filled 2016-09-20: qty 1

## 2016-09-20 MED ORDER — CEFAZOLIN SODIUM-DEXTROSE 2-4 GM/100ML-% IV SOLN
INTRAVENOUS | Status: AC
  Filled 2016-09-20: qty 100

## 2016-09-20 MED ORDER — SENNA 8.6 MG PO TABS
1.0000 | ORAL_TABLET | Freq: Two times a day (BID) | ORAL | Status: DC
  Administered 2016-09-20 – 2016-09-21 (×3): 8.6 mg via ORAL
  Filled 2016-09-20 (×3): qty 1

## 2016-09-20 MED ORDER — PHENAZOPYRIDINE HCL 200 MG PO TABS: 200.0000 mg | ORAL_TABLET | Freq: Three times a day (TID) | ORAL | 0 refills | Status: DC | PRN

## 2016-09-20 MED ORDER — OXYCODONE HCL 5 MG PO TABS: 5.0000 mg | ORAL_TABLET | ORAL | Status: DC | PRN

## 2016-09-20 MED ORDER — LIDOCAINE 2% (20 MG/ML) 5 ML SYRINGE
INTRAMUSCULAR | Status: AC
  Filled 2016-09-20: qty 5

## 2016-09-20 MED ORDER — ONDANSETRON HCL 4 MG/2ML IJ SOLN: 4.0000 mg | INTRAMUSCULAR | Status: DC | PRN

## 2016-09-20 MED ORDER — OXYCODONE HCL 5 MG PO TABS
5.0000 mg | ORAL_TABLET | Freq: Once | ORAL | Status: DC | PRN
Start: 2016-09-20 — End: 2016-09-20
  Filled 2016-09-20: qty 1

## 2016-09-20 MED ORDER — LACTATED RINGERS IV SOLN
INTRAVENOUS | Status: DC
  Administered 2016-09-20: 07:00:00 via INTRAVENOUS
  Filled 2016-09-20: qty 1000

## 2016-09-20 MED ORDER — SODIUM CHLORIDE 0.45 % IV SOLN
INTRAVENOUS | Status: DC
  Administered 2016-09-20 – 2016-09-21 (×2): via INTRAVENOUS

## 2016-09-20 MED ORDER — EPHEDRINE 5 MG/ML INJ
INTRAVENOUS | Status: AC
  Filled 2016-09-20: qty 10

## 2016-09-20 MED ORDER — CEFAZOLIN SODIUM-DEXTROSE 2-4 GM/100ML-% IV SOLN
2.0000 g | INTRAVENOUS | Status: AC
  Administered 2016-09-20: 2 g via INTRAVENOUS
  Filled 2016-09-20: qty 100

## 2016-09-20 MED ORDER — METOCLOPRAMIDE HCL 5 MG/ML IJ SOLN
INTRAMUSCULAR | Status: AC
  Filled 2016-09-20: qty 2

## 2016-09-20 MED ORDER — OXYCODONE HCL 5 MG/5ML PO SOLN
5.0000 mg | Freq: Once | ORAL | Status: DC | PRN
  Filled 2016-09-20: qty 5

## 2016-09-20 MED ORDER — BISOPROLOL-HYDROCHLOROTHIAZIDE 10-6.25 MG PO TABS
1.0000 | ORAL_TABLET | Freq: Every evening | ORAL | Status: DC
  Administered 2016-09-20: 1 via ORAL
  Filled 2016-09-20: qty 1

## 2016-09-20 MED ORDER — MIDAZOLAM HCL 5 MG/5ML IJ SOLN
INTRAMUSCULAR | Status: DC | PRN
  Administered 2016-09-20: 2 mg via INTRAVENOUS

## 2016-09-20 MED ORDER — ONDANSETRON HCL 4 MG/2ML IJ SOLN
INTRAMUSCULAR | Status: DC | PRN
  Administered 2016-09-20: 4 mg via INTRAVENOUS

## 2016-09-20 MED ORDER — MIDAZOLAM HCL 2 MG/2ML IJ SOLN
INTRAMUSCULAR | Status: AC
  Filled 2016-09-20: qty 2

## 2016-09-20 MED ORDER — DEXAMETHASONE SODIUM PHOSPHATE 4 MG/ML IJ SOLN
INTRAMUSCULAR | Status: DC | PRN
  Administered 2016-09-20: 10 mg via INTRAVENOUS

## 2016-09-20 MED ORDER — ACETAMINOPHEN 325 MG PO TABS: 650.0000 mg | ORAL_TABLET | ORAL | Status: DC | PRN

## 2016-09-20 MED ORDER — PROPOFOL 10 MG/ML IV BOLUS
INTRAVENOUS | Status: DC | PRN
  Administered 2016-09-20: 200 mg via INTRAVENOUS

## 2016-09-20 MED ORDER — ONDANSETRON HCL 4 MG/2ML IJ SOLN
INTRAMUSCULAR | Status: AC
  Filled 2016-09-20: qty 2

## 2016-09-20 MED ORDER — DEXAMETHASONE SODIUM PHOSPHATE 10 MG/ML IJ SOLN
INTRAMUSCULAR | Status: AC
  Filled 2016-09-20: qty 1

## 2016-09-20 MED ORDER — ZOLPIDEM TARTRATE 5 MG PO TABS: 5.0000 mg | ORAL_TABLET | Freq: Every evening | ORAL | Status: DC | PRN

## 2016-09-20 MED ORDER — BELLADONNA ALKALOIDS-OPIUM 16.2-60 MG RE SUPP: 1.0000 | Freq: Four times a day (QID) | RECTAL | Status: DC | PRN

## 2016-09-20 MED ORDER — HYDROMORPHONE HCL 1 MG/ML IJ SOLN
0.2500 mg | INTRAMUSCULAR | Status: DC | PRN
  Filled 2016-09-20: qty 0.5

## 2016-09-20 SURGICAL SUPPLY — 29 items
BAG DRAIN URO-CYSTO SKYTR STRL (DRAIN) ×2 IMPLANT
BAG DRN ANRFLXCHMBR STRAP LEK (BAG)
BAG DRN UROCATH (DRAIN) ×1
BAG URINE DRAINAGE (UROLOGICAL SUPPLIES) ×1 IMPLANT
BAG URINE LEG 19OZ MD ST LTX (BAG) IMPLANT
CATH FOLEY 2WAY SLVR  5CC 20FR (CATHETERS)
CATH FOLEY 2WAY SLVR  5CC 22FR (CATHETERS)
CATH FOLEY 2WAY SLVR 5CC 20FR (CATHETERS) IMPLANT
CATH FOLEY 2WAY SLVR 5CC 22FR (CATHETERS) IMPLANT
CATH FOLEY 3WAY 30CC 22F (CATHETERS) IMPLANT
CATH HEMA 3WAY 30CC 24FR RND (CATHETERS) ×1 IMPLANT
CLOTH BEACON ORANGE TIMEOUT ST (SAFETY) ×2 IMPLANT
ELECT REM PT RETURN 9FT ADLT (ELECTROSURGICAL) ×2
ELECTRODE REM PT RTRN 9FT ADLT (ELECTROSURGICAL) ×1 IMPLANT
EVACUATOR MICROVAS BLADDER (UROLOGICAL SUPPLIES) IMPLANT
GLOVE BIO SURGEON STRL SZ8 (GLOVE) ×2 IMPLANT
GOWN STRL REUS W/ TWL XL LVL3 (GOWN DISPOSABLE) ×1 IMPLANT
GOWN STRL REUS W/TWL XL LVL3 (GOWN DISPOSABLE) ×4 IMPLANT
HOLDER FOLEY CATH W/STRAP (MISCELLANEOUS) IMPLANT
KIT RM TURNOVER CYSTO AR (KITS) ×2 IMPLANT
LOOP CUT BIPOLAR 24F LRG (ELECTROSURGICAL) ×1 IMPLANT
LOOP MONOPOLAR YLW (ELECTROSURGICAL) ×1 IMPLANT
MANIFOLD NEPTUNE II (INSTRUMENTS) ×1 IMPLANT
PACK CYSTO (CUSTOM PROCEDURE TRAY) ×2 IMPLANT
PLUG CATH AND CAP STER (CATHETERS) IMPLANT
SET ASPIRATION TUBING (TUBING) IMPLANT
SYRINGE IRR TOOMEY STRL 70CC (SYRINGE) ×2 IMPLANT
TUBE CONNECTING 12X1/4 (SUCTIONS) ×1 IMPLANT
WATER STERILE IRR 3000ML UROMA (IV SOLUTION) ×3 IMPLANT

## 2016-09-20 NOTE — Anesthesia Preprocedure Evaluation (Signed)
Anesthesia Evaluation  Patient identified by MRN, date of birth, ID band Patient awake    Reviewed: Allergy & Precautions, NPO status , Patient's Chart, lab work & pertinent test results  Airway Mallampati: II  TM Distance: >3 FB Neck ROM: Full    Dental   Pulmonary former smoker,    breath sounds clear to auscultation       Cardiovascular hypertension,  Rhythm:Regular Rate:Normal     Neuro/Psych    GI/Hepatic negative GI ROS, Neg liver ROS,   Endo/Other  negative endocrine ROS  Renal/GU negative Renal ROS     Musculoskeletal   Abdominal   Peds  Hematology   Anesthesia Other Findings   Reproductive/Obstetrics                             Anesthesia Physical  Anesthesia Plan  ASA: III  Anesthesia Plan: General   Post-op Pain Management:    Induction: Intravenous  PONV Risk Score and Plan: 4 or greater and Ondansetron, Dexamethasone, Propofol, Midazolam and Scopolamine patch - Pre-op  Airway Management Planned:   Additional Equipment:   Intra-op Plan:   Post-operative Plan: Extubation in OR  Informed Consent: I have reviewed the patients History and Physical, chart, labs and discussed the procedure including the risks, benefits and alternatives for the proposed anesthesia with the patient or authorized representative who has indicated his/her understanding and acceptance.   Dental advisory given  Plan Discussed with: CRNA and Anesthesiologist  Anesthesia Plan Comments:         Anesthesia Quick Evaluation

## 2016-09-20 NOTE — H&P (Signed)
[name]  Chief Complaint: Bladder mass  History of Present Illness: 66 yr old male with h/o PCA, s/p RARP by Dr Alinda Money years ago (NED) now with a bladder dome mass and bulky RP adenopathy presents for TURBT.  Past Medical History:  Diagnosis Date  . Arthritis   . Bladder tumor   . History of prostate cancer followed by pcp dr [name]-  per pt last PSA undetectable   dx 2008-- (Stage T1c, Gleason 3+3,  PSA 4.58, vol 99cc)  s/p  radical prostatectomy (nerve sparing bilateral)   . Hypertension   . Lower urinary tract symptoms (LUTS)   . Pre-diabetes   . Wears glasses     Past Surgical History:  Procedure Laterality Date  . CATARACT EXTRACTION W/ INTRAOCULAR LENS  IMPLANT, BILATERAL Bilateral 2011  . [place]  . KNEE ARTHROSCOPY Bilateral right 2006;  left 02-15-2007  . [place];  1990;  1983  . ROBOT ASSISTED LAPAROSCOPIC RADICAL PROSTATECTOMY  06/20/2006   bilateral nerve sparing  . TOTAL HIP ARTHROPLASTY Left 03/03/2015   Procedure: LEFT TOTAL HIP ARTHROPLASTY ANTERIOR APPROACH;  Surgeon: Dorna Leitz, MD;  Location: [facility];  Service: Orthopedics;  Laterality: Left;  . TOTAL KNEE ARTHROPLASTY Bilateral left 08-13-2009;  right 12-26-2009    Home Medications:    Allergies: No Known Allergies  History reviewed. No pertinent family history.  Social History:  reports that he quit smoking about 32 years ago. His smoking use included Cigarettes. He quit after 16.00 years of use. He has never used smokeless tobacco. He reports that he drinks alcohol. He reports that he does not use drugs.  ROS: A complete review of systems was performed.  All systems are negative except for pertinent findings as noted.  Physical Exam:  Vital signs in last 24 hours: Temp:  [97.9 F (36.6 C)] 97.9 F (36.6 C) (06/11 0616) Pulse Rate:  [61] 61 (06/11 0616) Resp:  [18] 18 (06/11 0616) BP: (152)/(78) 152/78 (06/11 0616) SpO2:  [98 %] 98 % (06/11 0616) Weight:   [120.2 kg (265 lb)] 120.2 kg (265 lb) (06/11 0616) Constitutional:  Alert and oriented, No acute distress Cardiovascular: Regular rate and rhythm, No JVD Respiratory: Normal respiratory effort, Lungs clear bilaterally GI: Abdomen is soft, nontender, nondistended, no abdominal masses Genitourinary: No CVAT. Normal male phallus, testes are descended bilaterally and non-tender and without masses, scrotum is normal in appearance without lesions or masses, perineum is normal on inspection. Lymphatic: No lymphadenopathy Neurologic: Grossly intact, no focal deficits Psychiatric: Normal mood and affect  Laboratory Data:  No results for input(s): WBC, HGB, HCT, PLT in the last 72 hours.  No results for input(s): NA, K, CL, GLUCOSE, BUN, CALCIUM, CREATININE in the last 72 hours.  Invalid input(s): CO3   No results found for this or any previous visit (from the past 24 hour(s)). Recent Results (from the past 240 hour(s))  Urine culture     Status: None   Collection Time: 09/15/16  3:42 AM  Result Value Ref Range Status   Specimen Description URINE, RANDOM  Final   Special Requests NONE  Final   Culture   Final    NO GROWTH Performed at [facility], [address] [place], [place] [address]    Report Status 09/16/2016 FINAL  Final    Renal Function: No results for input(s): CREATININE in the last 168 hours. CrCl cannot be calculated (Patient's most recent lab result is older than the  maximum 21 days allowed.).  Radiologic Imaging: No results found.  Impression/Assessment:  Bladder tumor  Plan:  TURBT

## 2016-09-20 NOTE — Op Note (Signed)
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 with LMA  Specimen: Bladder tumor, to pathology  Drains: 24 French hematuria catheter.  Estimated blood loss: Less than 5 milliliters  Complications: None  Indications: 66 year old male, recently presenting with gross hematuria.  CT revealed a large, greater than 5 centimeter tumor at the dome of his bladder.  There was, unfortunately, retroperitoneal adenopathy as well.  The patient was found, cystoscopically, to have a calcified tumor at the dome of his bladder.  No other bladder lesions were seen.  The patient presents at this time for TURBT of this lesion of his bladder to provide hemostasis, as he has had bleeding recently with clot retention.  Additionally, we will provide pathologic review of this tumor, to assist with further management of his probable metastatic disease.  The procedure of TURBT as well as risks and complications the procedure have been discussed at length with the patient and his wife who understand and desire to proceed.  Findings: Calcified fungating tumor at the dome of the bladder.  This is greater than 5 centimeters in diameter.  It was additionally, nodular and sessile.  Description of procedure: The patient was properly identified in the holding area.  He was taken to the operating room where general anesthetic was administered with the LMA.  He received preoperative IV antibiotics.  His placed in the dorsolithotomy position.  Genitalia and perineum were prepped and draped.  Proper timeout was performed.  A 26 French resectoscope sheath was inserted with the visual obturator.  The urethra was normal.  Prostate was absent from prior prostatectomy.  The bladder was entered and inspected circumferentially.  Ureteral orifices were normal.   The above-mentioned findings were noted.  I placed the monopolar loop.  The tumor was resected after the calcified parts of the tumor were removed by scraping.  The underlying bladder tumor was then resected down to a flat surface.  This fungating area that had the calcifications was resected first.  Just to the right of this was the nodular tumor which was also resected down.  It was felt that this was fairly extensive, and I was quite certain that there was a fair amount of extra vesicle tumor.  However, I did not resect deeply, just enough to provide a smooth surface contiguous with the normal bladder surface.  Following vigorous resection of the whole bladder tumor/bladder abnormality, I cauterized the surface using the coag current.  Careful inspection revealed no bleeding following this.  The bladder tumor fragments were then irrigated from the bladder.  The calcified area was broken up into several smaller fragments which were then removed with the [facility] syringe.  Inspection again of the resected site revealed hemostasis.  At this point, the scope was removed.  I then passed a 24 [place] three-way hematuria catheter.  This was hooked to the bladder irrigation but the CBI was not run.  The patient was then awakened.  He was taken to the PACU in stable condition, having tolerated procedure well.

## 2016-09-20 NOTE — Anesthesia Postprocedure Evaluation (Signed)
Anesthesia Post Note  Patient: WEN MERCED  Procedure(s) Performed: Procedure(s) (LRB): TRANSURETHRAL RESECTION OF BLADDER TUMOR (TURBT) (N/A)     Patient location during evaluation: PACU Anesthesia Type: General Level of consciousness: awake and alert Pain management: pain level controlled Vital Signs Assessment: post-procedure vital signs reviewed and stable Respiratory status: spontaneous breathing, nonlabored ventilation and respiratory function stable Cardiovascular status: blood pressure returned to baseline and stable Postop Assessment: no signs of nausea or vomiting Anesthetic complications: no    Last Vitals:  Vitals:   09/20/16 0900 09/20/16 0923  BP: 131/71 (!) 123/55  Pulse: 62 64  Resp: 19 20  Temp:  36.6 C    Last Pain:  Vitals:   09/20/16 0616  TempSrc: Oral                 Lynda Rainwater

## 2016-09-20 NOTE — Discharge Instructions (Signed)
Transurethral Resection of Bladder Tumor (TURBT)  Definition:  Transurethral Resection of the Bladder Tumor is a surgical procedure used to diagnose and remove tumors within the bladder. TURBT is the most common treatment for early stage bladder cancer.   General instructions:  Your recent bladder surgery requires very little post hospital care but some definite precautions.  Despite the fact that no skin incisions were used, the area around the tumor removal site is raw and covered with scabs to promote healing and prevent bleeding. Certain precautions are needed to insure that the scabs are not disturbed over the next 2-4 weeks while the healing proceeds.  Because the raw surface inside your bladder and the irritating effects of urine you may expect frequency of urination and/or urgency (a stronger desire to urinate) and perhaps even getting up at night more often. This will usually resolve or improve slowly over the healing period. You may see some blood in your urine over the first 6 weeks. Do not be alarmed, even if the urine was clear for a while. Get off your feet and drink lots of fluids until clearing occurs. If you start to pass clots or don't improve call us.   Diet:  You may return to your normal diet immediately. Because of the raw surface of your bladder, alcohol, spicy foods, foods high in acid and drinks with caffeine may cause irritation or frequency and should be used in moderation. To keep your urine flowing freely and avoid constipation, drink plenty of fluids during the day (8-10 glasses). Tip: Avoid cranberry juice because it is very acidic.   Activity:  Your physical activity needs to be restricted somewhat.  We suggest that you reduce your activity under the circumstances until the bleeding has stopped. Heavy lifting (greater than 20 lbs.) and heavy exertion should be limited for 2-3 weeks.  Bowels:  It is important to keep your bowels regular during the postoperative period.  Straining with bowel movements can cause bleeding. A bowel movement every other day is reasonable. Use a mild laxative if needed, such as milk of magnesia 2-3 tablespoons, or 2 Dulcolax tablets. Call if you continue to have problems. If you had been taking narcotics for pain, before, during or after your surgery, you may be constipated.   Medication:  You should resume your pre-surgery medications unless told not to. In addition you may be given an antibiotic to prevent or treat infection. Antibiotics are not always necessary. All medication should be taken as prescribed until the bottles are finished unless you are having an unusual reaction to one of the drugs.  General Anesthetic, Adult  A doctor specialized in giving anesthesia (anesthesiologist) or a nurse specialized in giving anesthesia (nurse anesthetist) gives medicine that makes you sleep while a procedure is performed (general anesthetic). Once the general anesthetic has been administered, you will be in a sleeplike state in which you feel no pain. After having a general anesthetic you may feel:  Dizzy.  Weak.  Drowsy.  Confused.  These feelings are normal and can be expected to last for up to 24 hours after the procedure.   LET YOUR CAREGIVER KNOW ABOUT:  Allergies you have.  Medications you are taking, including herbs, eye drops, over the counter medications, dietary supplements, and creams.  Previous problems you have had with anesthetics or numbing medicines.  Use of cigarettes, alcohol, or illicit drugs.  Possibility of pregnancy, if this applies.  History of bleeding or blood disorders, including blood clots and clotting  disorders.  Previous surgeries you have had and types of anesthetics you have received.  Family medical history, especially anesthetic problems.  Other health problems.   AFTER THE PROCEDURE  After surgery, you will be taken to the recovery area where a nurse will monitor your progress. You will be allowed  to go home when you are awake, stable, taking fluids well, and without serious pain or complications.  For the first 24 hours following an anesthetic:  Have a responsible person with you.  Do not drive a car. If you are alone, do not take public transportation.  Do not engage in strenuous activity. You may usually resume normal activities the next day, or as advised by your caregiver.  Do not drink alcohol.  Do not take medicine that has not been prescribed by your caregiver.  Do not sign important papers or make important decisions as your judgement may be impaired.  You may resume a normal diet as directed.  Change bandages (dressings) as directed.  Only take over-the-counter or prescription medicines for pain, discomfort, or fever as directed by your caregiver.  If you have questions or problems that seem related to the anesthetic, call the hospital and ask for the anesthetist, anesthesiologist, or anesthesia department.   SEEK IMMEDIATE MEDICAL CARE IF:  You develop a rash.  You have difficulty breathing.  You have chest pain.  You have allergic problems.  You have uncontrolled nausea.  You have uncontrolled vomiting.  You develop any serious bleeding, especially from the incision site.  Document Released: 07/06/2007 Document Revised: 12/09/2010 Document Reviewed: 07/30/2010  [facility] Patient Information 2012 [place].

## 2016-09-20 NOTE — Interval H&P Note (Signed)
History and Physical Interval Note:  09/20/2016 7:17 AM  [name] Rose  has presented today for surgery, with the diagnosis of BLADDER TUMOR  The various methods of treatment have been discussed with the patient and family. After consideration of risks, benefits and other options for treatment, the patient has consented to  Procedure(s): TRANSURETHRAL RESECTION OF BLADDER TUMOR (TURBT) (N/A) as a surgical intervention .  The patient's history has been reviewed, patient examined, no change in status, stable for surgery.  I have reviewed the patient's chart and labs.  Questions were answered to the patient's satisfaction.     Jorja Loa

## 2016-09-20 NOTE — Transfer of Care (Signed)
Immediate Anesthesia Transfer of Care Note  Patient: [name] Rose  Procedure(s) Performed: Procedure(s) (LRB): TRANSURETHRAL RESECTION OF BLADDER TUMOR (TURBT) (N/A)  Patient Location: PACU  Anesthesia Type: General  Level of Consciousness: awake, sedated, patient cooperative and responds to stimulation  Airway & Oxygen Therapy: Patient Spontanous Breathing and Patient connected to  O2  Post-op Assessment: Report given to PACU RN, Post -op Vital signs reviewed and stable and Patient moving all extremities  Post vital signs: Reviewed and stable  Complications: No apparent anesthesia complications

## 2016-09-20 NOTE — Anesthesia Procedure Notes (Signed)
Procedure Name: LMA Insertion Date/Time: 09/20/2016 7:38 AM Performed by: Justice Rocher Pre-anesthesia Checklist: Patient identified, Emergency Drugs available, Suction available and Patient being monitored Patient Re-evaluated:Patient Re-evaluated prior to inductionOxygen Delivery Method: Circle system utilized Preoxygenation: Pre-oxygenation with 100% oxygen Intubation Type: IV induction Ventilation: Mask ventilation without difficulty LMA: LMA inserted LMA Size: 5.0 Number of attempts: 1 Airway Equipment and Method: Bite block Placement Confirmation: positive ETCO2 and breath sounds checked- equal and bilateral Tube secured with: Tape Dental Injury: Teeth and Oropharynx as per pre-operative assessment

## 2016-09-21 ENCOUNTER — Encounter (HOSPITAL_BASED_OUTPATIENT_CLINIC_OR_DEPARTMENT_OTHER): Payer: Self-pay | Admitting: Urology

## 2016-09-21 DIAGNOSIS — C671 Malignant neoplasm of dome of bladder: Secondary | ICD-10-CM | POA: Diagnosis not present

## 2016-09-21 NOTE — Discharge Summary (Signed)
Physician Discharge Summary  Patient ID: [name] Rose MRN: [id number] DOB/AGE: [birth year] 65 y.o.  Admit date: 09/20/2016 Discharge date: 09/21/2016  Admission Diagnoses:  Cancer of dome of urinary bladder [facility])  Discharge Diagnoses:  Principal Problem:   Cancer of dome of urinary bladder [facility])   Past Medical History:  Diagnosis Date  . Arthritis   . Bladder tumor   . History of prostate cancer followed by pcp dr [name]-  per pt last PSA undetectable   dx 2008-- (Stage T1c, Gleason 3+3,  PSA 4.58, vol 99cc)  s/p  radical prostatectomy (nerve sparing bilateral)   . Hypertension   . Lower urinary tract symptoms (LUTS)   . Pre-diabetes   . Wears glasses     Surgeries: Procedure(s): TRANSURETHRAL RESECTION OF BLADDER TUMOR (TURBT) on 09/20/2016   Consultants (if any):   Discharged Condition: Improved  Hospital Course: [name] Rose is an 66 y.o. male who was admitted 09/20/2016 with a diagnosis of Cancer of dome of urinary bladder ([place]) and went to the operating room on 09/20/2016 and underwent the above named procedures.  His foley was removed this morning and he is voiding clear urine.  He is ready for discharge.   He was given perioperative antibiotics:  Anti-infectives    Start     Dose/Rate Route Frequency Ordered Stop   09/20/16 1200  sulfamethoxazole-trimethoprim (BACTRIM DS,SEPTRA DS) 800-160 MG per tablet 1 tablet     1 tablet Oral Every 12 hours 09/20/16 1009     09/20/16 0642  ceFAZolin (ANCEF) IVPB 2g/100 mL premix     2 g 200 mL/hr over 30 Minutes Intravenous 30 min pre-op 09/20/16 0642 09/20/16 0753   09/20/16 0000  sulfamethoxazole-trimethoprim (BACTRIM DS,SEPTRA DS) 800-160 MG tablet     1 tablet Oral Every 12 hours 09/20/16 1931      .  He was given sequential compression devices for DVT prophylaxis.  He benefited maximally from the hospital stay and there were no complications.    Recent vital signs:  Vitals:   09/20/16 2014 09/21/16 0508  BP:  (!) 141/79 139/87  Pulse: 66 72  Resp: 19 18  Temp: 97.7 F (36.5 C) 98.5 F (36.9 C)    Recent laboratory studies:  Lab Results  Component Value Date   HGB 13.3 09/20/2016   HGB 10.0 (L) 03/05/2015   HGB 6.8 (LL) 03/04/2015   Lab Results  Component Value Date   WBC 11.5 (H) 03/05/2015   PLT 128 (L) 03/05/2015   Lab Results  Component Value Date   INR 1.03 02/26/2015   Lab Results  Component Value Date   NA 141 09/20/2016   K 3.9 09/20/2016   CL 102 09/20/2016   CO2 28 03/04/2015   BUN 20 09/20/2016   CREATININE 1.00 09/20/2016   GLUCOSE 110 (H) 09/20/2016    Discharge Medications:   Allergies as of 09/21/2016   No Known Allergies     Medication List    TAKE these medications   ALEVE 220 MG Caps Generic drug:  Naproxen Sodium Take by mouth as needed.   amLODipine 10 MG tablet Commonly known as:  NORVASC Take 10 mg by mouth every morning.   bisoprolol-hydrochlorothiazide 10-6.25 MG tablet Commonly known as:  ZIAC Take 1 tablet by mouth every evening.   Cinnamon 500 MG Tabs Take 2 tablets by mouth every morning.   losartan 100 MG tablet Commonly known as:  COZAAR Take 100 mg by mouth every morning.   MAGNESIUM  PO Take 400 mg by mouth at bedtime. Leg cramps   phenazopyridine 200 MG tablet Commonly known as:  PYRIDIUM Take 1 tablet (200 mg total) by mouth 3 (three) times daily as needed for pain.   sulfamethoxazole-trimethoprim 800-160 MG tablet Commonly known as:  BACTRIM DS,SEPTRA DS Take 1 tablet by mouth every 12 (twelve) hours.       Diagnostic Studies: No results found.  Disposition: 01-Home or Self Care  Discharge Instructions    Discontinue IV    Complete by:  As directed       Follow-up Information    Franchot Gallo, MD.   Specialty:  Urology Why:  we will call you Contact information: [place] [address] [phone]            Signed: FAARIS, ARIZPE 09/21/2016, 7:03 AM

## 2016-09-24 ENCOUNTER — Encounter: Payer: Self-pay | Admitting: Hematology & Oncology

## 2016-09-27 DIAGNOSIS — D494 Neoplasm of unspecified behavior of bladder: Secondary | ICD-10-CM | POA: Diagnosis not present

## 2016-09-27 DIAGNOSIS — E7800 Pure hypercholesterolemia, unspecified: Secondary | ICD-10-CM | POA: Diagnosis not present

## 2016-09-27 DIAGNOSIS — R7303 Prediabetes: Secondary | ICD-10-CM | POA: Diagnosis not present

## 2016-09-27 DIAGNOSIS — L03312 Cellulitis of back [any part except buttock]: Secondary | ICD-10-CM | POA: Diagnosis not present

## 2016-09-27 DIAGNOSIS — Z8546 Personal history of malignant neoplasm of prostate: Secondary | ICD-10-CM | POA: Diagnosis not present

## 2016-09-27 DIAGNOSIS — I10 Essential (primary) hypertension: Secondary | ICD-10-CM | POA: Diagnosis not present

## 2016-09-29 ENCOUNTER — Encounter: Payer: Self-pay | Admitting: *Deleted

## 2016-09-29 ENCOUNTER — Other Ambulatory Visit (HOSPITAL_BASED_OUTPATIENT_CLINIC_OR_DEPARTMENT_OTHER): Payer: PPO

## 2016-09-29 ENCOUNTER — Encounter: Payer: Self-pay | Admitting: Hematology & Oncology

## 2016-09-29 ENCOUNTER — Ambulatory Visit (HOSPITAL_BASED_OUTPATIENT_CLINIC_OR_DEPARTMENT_OTHER): Payer: PPO | Admitting: Hematology & Oncology

## 2016-09-29 ENCOUNTER — Ambulatory Visit (HOSPITAL_BASED_OUTPATIENT_CLINIC_OR_DEPARTMENT_OTHER): Payer: PPO

## 2016-09-29 VITALS — BP 130/80 | HR 63 | Temp 98.3°F | Resp 16 | Wt 266.0 lb

## 2016-09-29 DIAGNOSIS — C649 Malignant neoplasm of unspecified kidney, except renal pelvis: Secondary | ICD-10-CM

## 2016-09-29 DIAGNOSIS — C679 Malignant neoplasm of bladder, unspecified: Secondary | ICD-10-CM | POA: Diagnosis not present

## 2016-09-29 DIAGNOSIS — C772 Secondary and unspecified malignant neoplasm of intra-abdominal lymph nodes: Secondary | ICD-10-CM

## 2016-09-29 DIAGNOSIS — Z87891 Personal history of nicotine dependence: Secondary | ICD-10-CM

## 2016-09-29 DIAGNOSIS — Z7189 Other specified counseling: Secondary | ICD-10-CM

## 2016-09-29 DIAGNOSIS — C671 Malignant neoplasm of dome of bladder: Secondary | ICD-10-CM

## 2016-09-29 DIAGNOSIS — Z8546 Personal history of malignant neoplasm of prostate: Secondary | ICD-10-CM

## 2016-09-29 HISTORY — DX: Malignant neoplasm of bladder, unspecified: C77.2

## 2016-09-29 HISTORY — DX: Malignant neoplasm of bladder, unspecified: C67.9

## 2016-09-29 LAB — CBC WITH DIFFERENTIAL (CANCER CENTER ONLY)
BASO#: 0.1 10*3/uL (ref 0.0–0.2)
BASO%: 0.7 % (ref 0.0–2.0)
EOS%: 4.1 % (ref 0.0–7.0)
Eosinophils Absolute: 0.3 10*3/uL (ref 0.0–0.5)
HCT: 43.3 % (ref 38.7–49.9)
HGB: 14.9 g/dL (ref 13.0–17.1)
LYMPH#: 1.8 10*3/uL (ref 0.9–3.3)
LYMPH%: 21.6 % (ref 14.0–48.0)
MCH: 29.3 pg (ref 28.0–33.4)
MCHC: 34.4 g/dL (ref 32.0–35.9)
MCV: 85 fL (ref 82–98)
MONO#: 0.9 10*3/uL (ref 0.1–0.9)
MONO%: 11.1 % (ref 0.0–13.0)
NEUT#: 5.1 10*3/uL (ref 1.5–6.5)
NEUT%: 62.5 % (ref 40.0–80.0)
Platelets: 227 10*3/uL (ref 145–400)
RBC: 5.09 10*6/uL (ref 4.20–5.70)
RDW: 12.6 % (ref 11.1–15.7)
WBC: 8.2 10*3/uL (ref 4.0–10.0)

## 2016-09-29 LAB — CMP (CANCER CENTER ONLY)
ALT(SGPT): 29 U/L (ref 10–47)
AST: 30 U/L (ref 11–38)
Albumin: 3.7 g/dL (ref 3.3–5.5)
Alkaline Phosphatase: 64 U/L (ref 26–84)
BUN, Bld: 23 mg/dL — ABNORMAL HIGH (ref 7–22)
CO2: 30 mEq/L (ref 18–33)
Calcium: 9.4 mg/dL (ref 8.0–10.3)
Chloride: 105 mEq/L (ref 98–108)
Creat: 1 mg/dl (ref 0.6–1.2)
Glucose, Bld: 115 mg/dL (ref 73–118)
Potassium: 4.3 mEq/L (ref 3.3–4.7)
Sodium: 142 mEq/L (ref 128–145)
Total Bilirubin: 0.9 mg/dl (ref 0.20–1.60)
Total Protein: 7.2 g/dL (ref 6.4–8.1)

## 2016-09-29 LAB — LACTATE DEHYDROGENASE: LDH: 519 U/L — ABNORMAL HIGH (ref 125–245)

## 2016-09-29 NOTE — Progress Notes (Addendum)
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 Follow-up  : I have bladder cancer that has spread.  HPI: [name] is a very nice 66 year old white male. He comes in with his wife. He is been very healthy. He used to work in the [employer]. He is originally from [place]. He moved to the area when he was a teenager.  He did have localized prostate cancer back in 2008. He underwent radical prostatectomy. I don't think he needed any type of postop surgery.  He began to have some hematuria a couple months or so ago. He did not have any obvious abdominal pain. On occasion there may have been some discomfort over on the left side of his abdomen.  He had no weight loss or weight gain. He's been playing golf. He's had no leg swelling. He's had no cough or shortness of breath.  He was seen by Dr. Diona Fanti of urology. He ultimately underwent a cystoscopy. This showed a mass in the dome of the bladder.  He did have a CT scan done. This was done in Dr. Alan Ripper office. This, unfortunately, showed extensive rectoperineal adenopathy. He had bulky retroperitoneal lymph nodes surrounding the aorta. The measured 8 x 4.8 cm. Also noted was adenopathy in both iliac chains and pelvic adenopathy. The pelvic lymph nodes measured 2.6 cm. There is nothing in the lower lungs. There is nothing in the liver.  He did undergo a TURBT on June 11. The pathology report ([id number]) showed an invasive high-grade urothelial carcinoma.  He was, he referred to the [facility] for consideration of systemic therapy.  He did smoke. He stopped smoking back in the early 1980s. He probably smoked for about 15 years. He does enjoy an alcoholic beverage on occasion.  There is no history of cancer in the family. His father died from emphysema from smoking.  He's had no weight loss or weight gain. His  appetite is good. He is eating well.  He is not having any diarrhea. There are no rashes. He's had no leg swelling. There are no headaches. He has had no fever. He has had no cough or shortness of breath.  Overall, I think that his performance status is ECOG 0.   Past Medical History:  Diagnosis Date  . Arthritis   . 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 Bladder tumor   . History of prostate cancer followed by pcp dr [name]-  per pt last PSA undetectable   dx 2008-- (Stage T1c, Gleason 3+3,  PSA 4.58, vol 99cc)  s/p  radical prostatectomy (nerve sparing bilateral)   . Hypertension   . Lower urinary tract symptoms (LUTS)   . Pre-diabetes   . Wears glasses   :  Past Surgical History:  Procedure Laterality Date  . CATARACT EXTRACTION W/ INTRAOCULAR LENS  IMPLANT, BILATERAL Bilateral 2011  . [place]  . KNEE ARTHROSCOPY Bilateral right 2006;  left 02-15-2007  . [place];  1990;  1983  . ROBOT ASSISTED LAPAROSCOPIC RADICAL PROSTATECTOMY  06/20/2006   bilateral nerve sparing  . TOTAL HIP ARTHROPLASTY Left 03/03/2015   Procedure: LEFT TOTAL HIP ARTHROPLASTY ANTERIOR APPROACH;  Surgeon: Dorna Leitz, MD;  Location: [place];  Service: Orthopedics;  Laterality: Left;  . TOTAL KNEE ARTHROPLASTY Bilateral left 08-13-2009;  right 12-26-2009  . TRANSURETHRAL RESECTION OF BLADDER  TUMOR N/A 09/20/2016   Procedure: TRANSURETHRAL RESECTION OF BLADDER TUMOR (TURBT);  Surgeon: Franchot Gallo, MD;  Location: [facility];  Service: Urology;  Laterality: N/A;  :   Current Outpatient Prescriptions:  .  amLODipine (NORVASC) 10 MG tablet, Take 10 mg by mouth every morning., Disp: , Rfl:  .  bisoprolol-hydrochlorothiazide (ZIAC) 10-6.25 MG tablet, Take 1 tablet by mouth every evening. , Disp: , Rfl:  .  cephALEXin (KEFLEX) 500 MG capsule, Take 500 mg by mouth 2 (two) times daily., Disp: , Rfl: 0 .  Cinnamon 500  MG TABS, Take 2 tablets by mouth every morning., Disp: , Rfl:  .  losartan (COZAAR) 100 MG tablet, Take 100 mg by mouth every morning. , Disp: , Rfl:  .  MAGNESIUM PO, Take 400 mg by mouth at bedtime. Leg cramps, Disp: , Rfl:  .  Naproxen Sodium (ALEVE) 220 MG CAPS, Take by mouth as needed., Disp: , Rfl: :  :  No Known Allergies:  No family history on file.:  Social History   Social History  . Marital status: Married    Spouse name: N/A  . Number of children: N/A  . Years of education: N/A   Occupational History  . Not on file.   Social History Main Topics  . Smoking status: Former Smoker    Years: 16.00    Types: Cigarettes    Quit date: 09/25/1984  . Smokeless tobacco: Never Used  . Alcohol use Yes     Comment: occasionally  . Drug use: No  . Sexual activity: Not on file   Other Topics Concern  . Not on file   Social History Narrative  . No narrative on file  :  Pertinent items are noted in HPI.  Exam: Well-developed and well-nourished white male in no obvious distress. Vital signs are temperature of 98.3. Pulse 63. Blood pressure 130/80. Weight is 266 pounds. Head and neck exam shows no ocular or oral lesions. There are no palpable cervical or supraclavicular lymph nodes. Lungs are clear. Cardiac exam regular rate and rhythm with no murmurs, rubs or bruits. Abdomen is soft. He has good bowel sounds. There is no fluid wave. There are no palpable abdominal masses. There is no palpable adenopathy in the inguinal region. There is no palpable liver and spleen tip. Back exam shows no tenderness over the spine, ribs or hips. Extremities shows no clubbing, cyanosis or edema. Has good range motion of his joints. He does have scars over his knees bilaterally from surgery. Skin exam shows no rashes, ecchymoses or petechia. Neurological exam shows no focal neurological deficits.    Recent Labs  09/29/16 0812  WBC 8.2  HGB 14.9  HCT 43.3  PLT 227    Recent Labs   09/29/16 0812  NA 142  K 4.3  CL 105  CO2 30  GLUCOSE 115  BUN 23*  CREATININE 1.0  CALCIUM 9.4    Blood smear review:  None  Pathology: See above     Assessment and Plan:  [name] Rose is a 66 year old white male. He has stage IV bladder cancer by virtue of the periaortic lymphadenopathy. This would be considered metastatic disease and the staging system for bladder cancer.  Given that he just has lymphadenopathy, as far as we can tell, we might be able to get a very good response with systemic chemotherapy.  He would be a good candidate for aggressive intervention. I would consider cis-platinum/gemcitabine. I think this would be a  good regimen for him.  I do feel at a PET scan is indicated. We will schedule him for a PET scan. This will allow [place] to see if there are any areas of metastatic disease that we just do not see on the CT scan that he had done recently.  I spent a good hour with he and his wife. I gave him information sheets about the chemotherapy. He will need to have a Port-A-Cath placed. I explained all this to them.  I explained the side effects of chemotherapy. I did talk about the risk of infection, fatigue, nausea/vomiting, constipation or diarrhea, skin rashes, possibility of decreased hearing. He understands all this. He wishes to proceed.  I think we should try 3 cycles of treatment and then repeat a follow-up PET scan to see how he is responding.  I would think that the chance of response should be about 75-80%.  Possibly, if we get a very good response, we might be able to consider him for surgical resection.  We will try to get started with treatment the first week in July. I'll see him back at the start of the second cycle of treatment.  I spent about an hour with he and his wife. I answered all of their questions. I gave him a prayer blanket which he was very thankful for.

## 2016-09-29 NOTE — Progress Notes (Signed)
START ON PATHWAY REGIMEN - Bladder     A cycle is every 21 days:     Gemcitabine      Cisplatin   **Always confirm dose/schedule in your pharmacy ordering system**    Patient Characteristics: Metastatic Disease, First Line, No Prior Neoadjuvant/Adjuvant Therapy AJCC M Category: M1a AJCC N Category: N3 AJCC T Category: T3 Current evidence of distant metastases? Yes AJCC 8 Stage Grouping: IVA Line of therapy: First Line Would you be surprised if this patient died  in the next year? I would be surprised if this patient died in the next year Prior Neoadjuvant/Adjuvant Therapy? No Intent of Therapy: Non-Curative / Palliative Intent, Discussed with Patient

## 2016-09-30 ENCOUNTER — Encounter: Payer: Self-pay | Admitting: Hematology & Oncology

## 2016-09-30 DIAGNOSIS — Z7189 Other specified counseling: Secondary | ICD-10-CM | POA: Insufficient documentation

## 2016-09-30 HISTORY — DX: Other specified counseling: Z71.89

## 2016-10-01 ENCOUNTER — Other Ambulatory Visit: Payer: Self-pay | Admitting: *Deleted

## 2016-10-01 DIAGNOSIS — C679 Malignant neoplasm of bladder, unspecified: Secondary | ICD-10-CM

## 2016-10-01 DIAGNOSIS — C772 Secondary and unspecified malignant neoplasm of intra-abdominal lymph nodes: Secondary | ICD-10-CM

## 2016-10-05 ENCOUNTER — Other Ambulatory Visit: Payer: PPO

## 2016-10-05 ENCOUNTER — Encounter: Payer: Self-pay | Admitting: *Deleted

## 2016-10-06 ENCOUNTER — Other Ambulatory Visit: Payer: Self-pay | Admitting: Radiology

## 2016-10-06 ENCOUNTER — Other Ambulatory Visit: Payer: Self-pay | Admitting: *Deleted

## 2016-10-06 DIAGNOSIS — C772 Secondary and unspecified malignant neoplasm of intra-abdominal lymph nodes: Secondary | ICD-10-CM

## 2016-10-06 DIAGNOSIS — C679 Malignant neoplasm of bladder, unspecified: Secondary | ICD-10-CM

## 2016-10-06 MED ORDER — PROCHLORPERAZINE MALEATE 10 MG PO TABS: 10.0000 mg | ORAL_TABLET | Freq: Four times a day (QID) | ORAL | 1 refills | Status: DC | PRN

## 2016-10-06 MED ORDER — ONDANSETRON HCL 8 MG PO TABS: 8.0000 mg | ORAL_TABLET | Freq: Two times a day (BID) | ORAL | 1 refills | Status: DC | PRN

## 2016-10-06 MED ORDER — LORAZEPAM 0.5 MG PO TABS: 0.5000 mg | ORAL_TABLET | Freq: Four times a day (QID) | ORAL | 0 refills | Status: DC | PRN

## 2016-10-06 MED ORDER — DEXAMETHASONE 4 MG PO TABS: ORAL_TABLET | ORAL | 1 refills | Status: DC

## 2016-10-07 ENCOUNTER — Other Ambulatory Visit: Payer: Self-pay | Admitting: Hematology & Oncology

## 2016-10-07 ENCOUNTER — Encounter (HOSPITAL_COMMUNITY): Payer: Self-pay

## 2016-10-07 ENCOUNTER — Ambulatory Visit (HOSPITAL_COMMUNITY)
Admission: RE | Admit: 2016-10-07 | Discharge: 2016-10-07 | Disposition: A | Discharge status: Home / Self Care | Payer: PPO | Source: Ambulatory Visit | Attending: Hematology & Oncology | Admitting: Hematology & Oncology

## 2016-10-07 DIAGNOSIS — C671 Malignant neoplasm of dome of bladder: Secondary | ICD-10-CM

## 2016-10-07 DIAGNOSIS — C772 Secondary and unspecified malignant neoplasm of intra-abdominal lymph nodes: Secondary | ICD-10-CM

## 2016-10-07 DIAGNOSIS — C679 Malignant neoplasm of bladder, unspecified: Secondary | ICD-10-CM

## 2016-10-07 DIAGNOSIS — M1990 Unspecified osteoarthritis, unspecified site: Secondary | ICD-10-CM | POA: Diagnosis not present

## 2016-10-07 DIAGNOSIS — Z8551 Personal history of malignant neoplasm of bladder: Secondary | ICD-10-CM | POA: Insufficient documentation

## 2016-10-07 DIAGNOSIS — Z8546 Personal history of malignant neoplasm of prostate: Secondary | ICD-10-CM | POA: Diagnosis not present

## 2016-10-07 DIAGNOSIS — C61 Malignant neoplasm of prostate: Secondary | ICD-10-CM | POA: Diagnosis not present

## 2016-10-07 DIAGNOSIS — R7303 Prediabetes: Secondary | ICD-10-CM | POA: Insufficient documentation

## 2016-10-07 DIAGNOSIS — Z87891 Personal history of nicotine dependence: Secondary | ICD-10-CM | POA: Diagnosis not present

## 2016-10-07 DIAGNOSIS — I10 Essential (primary) hypertension: Secondary | ICD-10-CM | POA: Insufficient documentation

## 2016-10-07 DIAGNOSIS — Z452 Encounter for adjustment and management of vascular access device: Secondary | ICD-10-CM | POA: Diagnosis not present

## 2016-10-07 HISTORY — PX: IR FLUORO GUIDE PORT INSERTION RIGHT: IMG5741

## 2016-10-07 HISTORY — PX: IR US GUIDE VASC ACCESS RIGHT: IMG2390

## 2016-10-07 LAB — CBC
HCT: 40.2 % (ref 39.0–52.0)
Hemoglobin: 14 g/dL (ref 13.0–17.0)
MCH: 28.7 pg (ref 26.0–34.0)
MCHC: 34.8 g/dL (ref 30.0–36.0)
MCV: 82.5 fL (ref 78.0–100.0)
Platelets: 198 10*3/uL (ref 150–400)
RBC: 4.87 MIL/uL (ref 4.22–5.81)
RDW: 12.6 % (ref 11.5–15.5)
WBC: 6.9 10*3/uL (ref 4.0–10.5)

## 2016-10-07 LAB — BASIC METABOLIC PANEL
Anion gap: 9 (ref 5–15)
BUN: 22 mg/dL — ABNORMAL HIGH (ref 6–20)
CO2: 27 mmol/L (ref 22–32)
Calcium: 9.1 mg/dL (ref 8.9–10.3)
Chloride: 103 mmol/L (ref 101–111)
Creatinine, Ser: 0.97 mg/dL (ref 0.61–1.24)
GFR calc Af Amer: >60 mL/min (ref >60)
GFR calc non Af Amer: >60 mL/min (ref >60)
Glucose, Bld: 112 mg/dL — ABNORMAL HIGH (ref 65–99)
Potassium: 4.2 mmol/L (ref 3.5–5.1)
Sodium: 139 mmol/L (ref 135–145)

## 2016-10-07 LAB — PROTIME-INR
INR: 1.04
Prothrombin Time: 13.6 seconds (ref 11.4–15.2)

## 2016-10-07 LAB — APTT: aPTT: 31 seconds (ref 24–36)

## 2016-10-07 MED ORDER — CEFAZOLIN SODIUM-DEXTROSE 2-4 GM/100ML-% IV SOLN
INTRAVENOUS | Status: AC
  Administered 2016-10-07: 2 g via INTRAVENOUS
  Filled 2016-10-07: qty 100

## 2016-10-07 MED ORDER — LIDOCAINE-EPINEPHRINE (PF) 2 %-1:200000 IJ SOLN
INTRAMUSCULAR | Status: AC
  Filled 2016-10-07: qty 20

## 2016-10-07 MED ORDER — FENTANYL CITRATE (PF) 100 MCG/2ML IJ SOLN
INTRAMUSCULAR | Status: AC | PRN
  Administered 2016-10-07 (×2): 50 ug via INTRAVENOUS

## 2016-10-07 MED ORDER — MIDAZOLAM HCL 2 MG/2ML IJ SOLN
INTRAMUSCULAR | Status: AC
  Filled 2016-10-07: qty 6

## 2016-10-07 MED ORDER — CEFAZOLIN SODIUM-DEXTROSE 2-4 GM/100ML-% IV SOLN
2.0000 g | INTRAVENOUS | Status: AC
  Administered 2016-10-07: 2 g via INTRAVENOUS

## 2016-10-07 MED ORDER — LIDOCAINE HCL 1 % IJ SOLN
INTRAMUSCULAR | Status: AC
  Filled 2016-10-07: qty 20

## 2016-10-07 MED ORDER — HEPARIN SOD (PORK) LOCK FLUSH 100 UNIT/ML IV SOLN
INTRAVENOUS | Status: AC | PRN
  Administered 2016-10-07: 500 [IU] via INTRAVENOUS

## 2016-10-07 MED ORDER — HEPARIN SOD (PORK) LOCK FLUSH 100 UNIT/ML IV SOLN
INTRAVENOUS | Status: AC
  Filled 2016-10-07: qty 5

## 2016-10-07 MED ORDER — SODIUM CHLORIDE 0.9 % IV SOLN
INTRAVENOUS | Status: DC
  Administered 2016-10-07: 08:00:00 via INTRAVENOUS

## 2016-10-07 MED ORDER — FENTANYL CITRATE (PF) 100 MCG/2ML IJ SOLN
INTRAMUSCULAR | Status: AC
  Filled 2016-10-07: qty 4

## 2016-10-07 MED ORDER — MIDAZOLAM HCL 2 MG/2ML IJ SOLN
INTRAMUSCULAR | Status: AC | PRN
  Administered 2016-10-07 (×2): 1 mg via INTRAVENOUS

## 2016-10-07 NOTE — Consult Note (Signed)
Chief Complaint: Patient was seen in consultation today for Port-A-Cath placement  Referring Physician(s): Ennever,Peter R  Supervising Physician: Corrie Mckusick  Patient Status: [facility] - Out-pt  History of Present Illness: [name] Rose is a 66 y.o. male with history of remote prostate cancer and recently diagnosed stage IV bladder carcinoma who presents today for Port-A-Cath placement for chemotherapy.  Past Medical History:  Diagnosis Date  . Arthritis   . 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 ([place]) 09/29/2016  . Bladder tumor   . Goals of care, counseling/discussion 09/30/2016  . History of prostate cancer followed by pcp dr [name]-  per pt last PSA undetectable   dx 2008-- (Stage T1c, Gleason 3+3,  PSA 4.58, vol 99cc)  s/p  radical prostatectomy (nerve sparing bilateral)   . Hypertension   . Lower urinary tract symptoms (LUTS)   . Pre-diabetes   . Wears glasses     Past Surgical History:  Procedure Laterality Date  . CATARACT EXTRACTION W/ INTRAOCULAR LENS  IMPLANT, BILATERAL Bilateral 2011  . [place]  . KNEE ARTHROSCOPY Bilateral right 2006;  left 02-15-2007  . [place];  1990;  1983  . ROBOT ASSISTED LAPAROSCOPIC RADICAL PROSTATECTOMY  06/20/2006   bilateral nerve sparing  . TOTAL HIP ARTHROPLASTY Left 03/03/2015   Procedure: LEFT TOTAL HIP ARTHROPLASTY ANTERIOR APPROACH;  Surgeon: Dorna Leitz, MD;  Location: [place];  Service: Orthopedics;  Laterality: Left;  . TOTAL KNEE ARTHROPLASTY Bilateral left 08-13-2009;  right 12-26-2009  . TRANSURETHRAL RESECTION OF BLADDER TUMOR N/A 09/20/2016   Procedure: TRANSURETHRAL RESECTION OF BLADDER TUMOR (TURBT);  Surgeon: Franchot Gallo, MD;  Location: [facility];  Service: Urology;  Laterality: N/A;    Allergies: Patient has no known allergies.  Medications: Prior to Admission medications   Medication Sig Start Date End Date Taking? Authorizing  Provider  amLODipine (NORVASC) 10 MG tablet Take 10 mg by mouth every morning.   Yes [provider]  bisoprolol-hydrochlorothiazide (ZIAC) 10-6.25 MG tablet Take 1 tablet by mouth every evening.    Yes [provider]  Cinnamon 500 MG TABS Take 2 tablets by mouth every morning.   Yes [provider]  losartan (COZAAR) 100 MG tablet Take 100 mg by mouth every morning.    Yes [provider]  MAGNESIUM PO Take 400 mg by mouth at bedtime. Leg cramps   Yes [provider]  Naproxen Sodium (ALEVE) 220 MG CAPS Take by mouth as needed.   Yes [provider]  cephALEXin (KEFLEX) 500 MG capsule Take 500 mg by mouth 2 (two) times daily. 09/27/16   [provider]  dexamethasone (DECADRON) 4 MG tablet Take 2 tablets by mouth once a day on the day after cisplatin chemotherapy and then take 2 tablets two times a day for 2 days. Take with food. 10/06/16   Volanda Napoleon, MD  LORazepam (ATIVAN) 0.5 MG tablet Take 1 tablet (0.5 mg total) by mouth every 6 (six) hours as needed (Nausea or vomiting). 10/06/16   Volanda Napoleon, MD  ondansetron (ZOFRAN) 8 MG tablet Take 1 tablet (8 mg total) by mouth 2 (two) times daily as needed. Start on the third day after cisplatin chemotherapy. 10/06/16   Volanda Napoleon, MD  prochlorperazine (COMPAZINE) 10 MG tablet Take 1 tablet (10 mg total) by mouth every 6 (six) hours as needed (Nausea or vomiting). 10/06/16   Volanda Napoleon, MD  History reviewed. No pertinent family history.  Social History   Social History  . Marital status: Married    Spouse name: N/A  . Number of children: N/A  . Years of education: N/A   Social History Main Topics  . Smoking status: Former Smoker    Years: 16.00    Types: Cigarettes    Quit date: 09/25/1984  . Smokeless tobacco: Never Used  . Alcohol use Yes     Comment: occasionally  . Drug use: No  . Sexual activity: Not Asked   Other Topics Concern  . None   Social  History Narrative  . None     Review of Systems currently denies fever, headache, chest pain, dyspnea, cough, back pain, nausea, vomiting, dysuria, hematuria; does have some mild lower abdominal discomfort  Vital Signs: BP 120/84 (BP Location: Left Arm)   Pulse 67   Temp 98.2 F (36.8 C) (Oral)   Resp 18   Ht $R'6\' 2"'VK$  (1.88 m)   Wt 266 lb (120.7 kg)   SpO2 97%   BMI 34.15 kg/m   Physical Exam awake, alert. Chest with distant breath sounds bilaterally. Heart with regular rate and rhythm. Abdomen soft, positive bowel sounds, mild pelvic tenderness to palpation, no lower extremity edema.  Mallampati Score:     Imaging: No results found.  Labs:  CBC:  Recent Labs  09/20/16 0657 09/29/16 0812 10/07/16 0754  WBC  --  8.2 6.9  HGB 13.3 14.9 14.0  HCT 39.0 43.3 40.2  PLT  --  227 198    COAGS: No results for input(s): INR, APTT in the last 8760 hours.  BMP:  Recent Labs  09/20/16 0657 09/29/16 0812 10/07/16 0754  NA 141 142 139  K 3.9 4.3 4.2  CL 102 105 103  CO2  --  30 27  GLUCOSE 110* 115 112*  BUN 20 23* 22*  CALCIUM  --  9.4 9.1  CREATININE 1.00 1.0 0.97  GFRNONAA  --   --  >60  GFRAA  --   --  >60    LIVER FUNCTION TESTS:  Recent Labs  09/29/16 0812  BILITOT 0.90  AST 30  ALT 29  ALKPHOS 64  PROT 7.2  ALBUMIN 3.7    TUMOR MARKERS: No results for input(s): AFPTM, CEA, CA199, CHROMGRNA in the last 8760 hours.  Assessment and Plan:  66 y.o. male with history of remote prostate cancer and recently diagnosed stage IV bladder carcinoma who presents today for Port-A-Cath placement for chemotherapy.Risks and benefits discussed with the patient/wife including, but not limited to bleeding, infection, pneumothorax, or fibrin sheath development and need for additional procedures.All of the patient's questions were answered, patient is agreeable to proceed.Consent signed and in chart.    Thank you for this interesting consult.  I greatly enjoyed  meeting [name] Rose and look forward to participating in their care.  A copy of this report was sent to the requesting provider on this date.  Electronically Signed: D. Rowe Robert, PA-C 10/07/2016, 9:07 AM   I spent a total of 20 minutes  in face to face in clinical consultation, greater than 50% of which was counseling/coordinating care for Port-A-Cath placement

## 2016-10-07 NOTE — Procedures (Signed)
Interventional Radiology Procedure Note  Procedure: Placement of a right IJ approach single lumen PowerPort.  Tip is positioned at the superior cavoatrial junction and catheter is ready for immediate use.  Complications: None Recommendations:  - Ok to shower tomorrow - Do not submerge for 7 days - Routine line care   Signed,  Raela Bohl S. Keats Kingry, DO   

## 2016-10-07 NOTE — Discharge Instructions (Addendum)
Moderate Conscious Sedation, Adult, Care After These instructions provide you with information about caring for yourself after your procedure. Your health care provider may also give you more specific instructions. Your treatment has been planned according to current medical practices, but problems sometimes occur. Call your health care provider if you have any problems or questions after your procedure. What can I expect after the procedure? After your procedure, it is common:  To feel sleepy for several hours.  To feel clumsy and have poor balance for several hours.  To have poor judgment for several hours.  To vomit if you eat too soon.  Follow these instructions at home: For at least 24 hours after the procedure:   Do not: ? Participate in activities where you could fall or become injured. ? Drive. ? Use heavy machinery. ? Drink alcohol. ? Take sleeping pills or medicines that cause drowsiness. ? Make important decisions or sign legal documents. ? Take care of children on your own.  Rest. Eating and drinking  Follow the diet recommended by your health care provider.  If you vomit: ? Drink water, juice, or soup when you can drink without vomiting. ? Make sure you have little or no nausea before eating solid foods. General instructions  Have a responsible adult stay with you until you are awake and alert.  Take over-the-counter and prescription medicines only as told by your health care provider.  If you smoke, do not smoke without supervision.  Keep all follow-up visits as told by your health care provider. This is important. Contact a health care provider if:  You keep feeling nauseous or you keep vomiting.  You feel light-headed.  You develop a rash.  You have a fever. Get help right away if:  You have trouble breathing. This information is not intended to replace advice given to you by your health care provider. Make sure you discuss any questions you have  with your health care provider. Document Released: 01/17/2013 Document Revised: 09/01/2015 Document Reviewed: 07/19/2015 Elsevier Interactive Patient Education  2018 [place] Insertion Implanted port insertion is a procedure to put in a port and catheter. The port is a device with an injectable disk that can be accessed by your health care provider. The port is connected to a vein in the chest or neck by a small flexible tube (catheter). There are different types of ports. The implanted port may be used as a long-term IV access for:  Medicines, such as chemotherapy.  Fluids.  Liquid nutrition, such as total parenteral nutrition (TPN).  Blood samples.  Having a port means that your health care provider will not need to use the veins in your arms for these procedures. Tell a health care provider about:  Any allergies you have.  All medicines you are taking, especially blood thinners, as well as any vitamins, herbs, eye drops, creams, over-the-counter medicines, and steroids.  Any problems you or family members have had with anesthetic medicines.  Any blood disorders you have.  Any surgeries you have had.  Any medical conditions you have, including diabetes or kidney problems.  Whether you are pregnant or may be pregnant. What are the risks? Generally, this is a safe procedure. However, problems may occur, including:  Allergic reactions to medicines or dyes.  Damage to other structures or organs.  Infection.  Damage to the blood vessel, bruising, or bleeding at the puncture site.  Blood clot.  Breakdown of the skin over the port.  A collection  of air in the chest that can cause one of the lungs to collapse (pneumothorax). This is rare.  What happens before the procedure? Staying hydrated Follow instructions from your health care provider about hydration, which may include:  Up to 2 hours before the procedure - you may continue to drink clear  liquids, such as water, clear fruit juice, black coffee, and plain tea.  Eating and drinking restrictions  Follow instructions from your health care provider about eating and drinking, which may include: ? 8 hours before the procedure - stop eating heavy meals or foods such as meat, fried foods, or fatty foods. ? 6 hours before the procedure - stop eating light meals or foods, such as toast or cereal. ? 6 hours before the procedure - stop drinking milk or drinks that contain milk. ? 2 hours before the procedure - stop drinking clear liquids. Medicines  Ask your health care provider about: ? Changing or stopping your regular medicines. This is especially important if you are taking diabetes medicines or blood thinners. ? Taking medicines such as aspirin and ibuprofen. These medicines can thin your blood. Do not take these medicines before your procedure if your health care provider instructs you not to.  You may be given antibiotic medicine to help prevent infection. General instructions  Plan to have someone take you home from the hospital or clinic.  If you will be going home right after the procedure, plan to have someone with you for 24 hours.  You may have blood tests.  You may be asked to shower with a germ-killing soap. What happens during the procedure?  To lower your risk of infection: ? Your health care team will wash or sanitize their hands. ? Your skin will be washed with soap. ? Hair may be removed from the surgical area.  An IV tube will be inserted into one of your veins.  You will be given one or more of the following: ? A medicine to help you relax (sedative). ? A medicine to numb the area (local anesthetic).  Two small cuts (incisions) will be made to insert the port. ? One incision will be made in your neck to get access to the vein where the catheter will lie. ? The other incision will be made in the upper chest. This is where the port will lie.  The  procedure may be done using continuous X-ray (fluoroscopy) or other imaging tools for guidance.  The port and catheter will be placed. There may be a small, raised area where the port is.  The port will be flushed with a salt solution (saline), and blood will be drawn to make sure that it is working correctly.  The incisions will be closed.  Bandages (dressings) may be placed over the incisions. The procedure may vary among health care providers and hospitals. What happens after the procedure?  Your blood pressure, heart rate, breathing rate, and blood oxygen level will be monitored until the medicines you were given have worn off.  Do not drive for 24 hours if you were given a sedative.  You will be given a manufacturer's information card for the type of port that you have. Keep this with you.  Your port will need to be flushed and checked as told by your health care provider, usually every few weeks.  A chest X-ray will be done to: ? Check the placement of the port. ? Make sure there is no injury to your lung. Summary  Implanted  port insertion is a procedure to put in a port and catheter.  The implanted port is used as a long-term IV access.  The port will need to be flushed and checked as told by your health care provider, usually every few weeks.  Keep your manufacturer's information card with you at all times. This information is not intended to replace advice given to you by your health care provider. Make sure you discuss any questions you have with your health care provider. Document Released: 01/17/2013 Document Revised: 02/18/2016 Document Reviewed: 02/18/2016 Elsevier Interactive Patient Education  2017 [employer].

## 2016-10-11 ENCOUNTER — Other Ambulatory Visit (HOSPITAL_BASED_OUTPATIENT_CLINIC_OR_DEPARTMENT_OTHER): Payer: PPO

## 2016-10-11 ENCOUNTER — Ambulatory Visit: Payer: PPO

## 2016-10-11 ENCOUNTER — Ambulatory Visit (HOSPITAL_BASED_OUTPATIENT_CLINIC_OR_DEPARTMENT_OTHER): Payer: PPO

## 2016-10-11 VITALS — BP 120/66 | HR 63 | Temp 98.2°F | Resp 18

## 2016-10-11 DIAGNOSIS — C772 Secondary and unspecified malignant neoplasm of intra-abdominal lymph nodes: Secondary | ICD-10-CM

## 2016-10-11 DIAGNOSIS — C649 Malignant neoplasm of unspecified kidney, except renal pelvis: Secondary | ICD-10-CM

## 2016-10-11 DIAGNOSIS — C679 Malignant neoplasm of bladder, unspecified: Secondary | ICD-10-CM | POA: Diagnosis not present

## 2016-10-11 DIAGNOSIS — Z5111 Encounter for antineoplastic chemotherapy: Secondary | ICD-10-CM | POA: Diagnosis not present

## 2016-10-11 DIAGNOSIS — C671 Malignant neoplasm of dome of bladder: Secondary | ICD-10-CM

## 2016-10-11 LAB — CBC WITH DIFFERENTIAL (CANCER CENTER ONLY)
BASO#: 0 10*3/uL (ref 0.0–0.2)
BASO%: 0.6 % (ref 0.0–2.0)
EOS%: 12.1 % — ABNORMAL HIGH (ref 0.0–7.0)
Eosinophils Absolute: 0.6 10*3/uL — ABNORMAL HIGH (ref 0.0–0.5)
HCT: 39.9 % (ref 38.7–49.9)
HGB: 13.4 g/dL (ref 13.0–17.1)
LYMPH#: 1.1 10*3/uL (ref 0.9–3.3)
LYMPH%: 21 % (ref 14.0–48.0)
MCH: 29 pg (ref 28.0–33.4)
MCHC: 33.6 g/dL (ref 32.0–35.9)
MCV: 86 fL (ref 82–98)
MONO#: 0.7 10*3/uL (ref 0.1–0.9)
MONO%: 12.6 % (ref 0.0–13.0)
NEUT#: 2.8 10*3/uL (ref 1.5–6.5)
NEUT%: 53.7 % (ref 40.0–80.0)
Platelets: 176 10*3/uL (ref 145–400)
RBC: 4.62 10*6/uL (ref 4.20–5.70)
RDW: 12.8 % (ref 11.1–15.7)
WBC: 5.1 10*3/uL (ref 4.0–10.0)

## 2016-10-11 LAB — CMP (CANCER CENTER ONLY)
ALT(SGPT): 28 U/L (ref 10–47)
AST: 36 U/L (ref 11–38)
Albumin: 3.4 g/dL (ref 3.3–5.5)
Alkaline Phosphatase: 69 U/L (ref 26–84)
BUN, Bld: 14 mg/dL (ref 7–22)
CO2: 29 mEq/L (ref 18–33)
Calcium: 9.1 mg/dL (ref 8.0–10.3)
Chloride: 100 mEq/L (ref 98–108)
Creat: 1.2 mg/dl (ref 0.6–1.2)
Glucose, Bld: 136 mg/dL — ABNORMAL HIGH (ref 73–118)
Potassium: 3.6 mEq/L (ref 3.3–4.7)
Sodium: 137 mEq/L (ref 128–145)
Total Bilirubin: 0.8 mg/dl (ref 0.20–1.60)
Total Protein: 6.5 g/dL (ref 6.4–8.1)

## 2016-10-11 LAB — TSH: TSH: 1.509 m(IU)/L (ref 0.320–4.118)

## 2016-10-11 MED ORDER — PALONOSETRON HCL INJECTION 0.25 MG/5ML
0.2500 mg | Freq: Once | INTRAVENOUS | Status: AC
  Administered 2016-10-11: 0.25 mg via INTRAVENOUS

## 2016-10-11 MED ORDER — HEPARIN SOD (PORK) LOCK FLUSH 100 UNIT/ML IV SOLN
500.0000 [IU] | Freq: Once | INTRAVENOUS | Status: AC | PRN
  Administered 2016-10-11: 500 [IU]
  Filled 2016-10-11: qty 5

## 2016-10-11 MED ORDER — FOSAPREPITANT DIMEGLUMINE INJECTION 150 MG
Freq: Once | INTRAVENOUS | Status: AC
  Administered 2016-10-11: 11:00:00 via INTRAVENOUS
  Filled 2016-10-11: qty 5

## 2016-10-11 MED ORDER — POTASSIUM CHLORIDE 2 MEQ/ML IV SOLN
Freq: Once | INTRAVENOUS | Status: AC
  Administered 2016-10-11: 09:00:00 via INTRAVENOUS
  Filled 2016-10-11: qty 10

## 2016-10-11 MED ORDER — SODIUM CHLORIDE 0.9 % IV SOLN
70.0000 mg/m2 | Freq: Once | INTRAVENOUS | Status: AC
  Administered 2016-10-11: 176 mg via INTRAVENOUS
  Filled 2016-10-11: qty 176

## 2016-10-11 MED ORDER — SODIUM CHLORIDE 0.9 % IV SOLN
Freq: Once | INTRAVENOUS | Status: AC
  Administered 2016-10-11: 11:00:00 via INTRAVENOUS

## 2016-10-11 MED ORDER — PALONOSETRON HCL INJECTION 0.25 MG/5ML
INTRAVENOUS | Status: AC
  Filled 2016-10-11: qty 5

## 2016-10-11 MED ORDER — LIDOCAINE-PRILOCAINE 2.5-2.5 % EX CREA: TOPICAL_CREAM | CUTANEOUS | 3 refills | Status: DC

## 2016-10-11 MED ORDER — SODIUM CHLORIDE 0.9 % IV SOLN
2600.0000 mg | Freq: Once | INTRAVENOUS | Status: AC
  Administered 2016-10-11: 2600 mg via INTRAVENOUS
  Filled 2016-10-11: qty 52.6

## 2016-10-11 MED ORDER — SODIUM CHLORIDE 0.9% FLUSH
10.0000 mL | INTRAVENOUS | Status: DC | PRN
  Administered 2016-10-11: 10 mL
  Filled 2016-10-11: qty 10

## 2016-10-11 NOTE — Patient Instructions (Signed)
[facility] Discharge Instructions for Patients Receiving Chemotherapy  Today you received the following chemotherapy agents Gemzar and Cisplatin.  To help prevent nausea and vomiting after your treatment, we encourage you to take your nausea medication as directed. NO ZOFRAN FOR 3 DAYS -TAKE COMPAZINE DURING THAT TIME.  IF THOSE MEDICATIONS ARE NOT WORKING THEN YOU CAN TRY THE ATIVAN FOR PERSISTENT CRAMPING/NAUSEA OR VOMITING.  TAKE YOUR DECADRON AS PRESCRIBED ON BOTTLE.   If you develop nausea and vomiting that is not controlled by your nausea medication, call the clinic.   BELOW ARE SYMPTOMS THAT SHOULD BE REPORTED IMMEDIATELY:  *FEVER GREATER THAN 100.5 F  *CHILLS WITH OR WITHOUT FEVER  NAUSEA AND VOMITING THAT IS NOT CONTROLLED WITH YOUR NAUSEA MEDICATION  *UNUSUAL SHORTNESS OF BREATH  *UNUSUAL BRUISING OR BLEEDING  TENDERNESS IN MOUTH AND THROAT WITH OR WITHOUT PRESENCE OF ULCERS  *URINARY PROBLEMS  *BOWEL PROBLEMS  UNUSUAL RASH Items with * indicate a potential emergency and should be followed up as soon as possible.  Feel free to call the clinic you have any questions or concerns. The clinic phone number is [phone].  Please show the CHEMO ALERT CARD at check-in to the Emergency Department and triage nurse.          Gemcitabine injection What is this medicine? GEMCITABINE (jem SIT a been) is a chemotherapy drug. This medicine is used to treat many types of cancer like breast cancer, lung cancer, pancreatic cancer, and ovarian cancer. This medicine may be used for other purposes; ask your health care provider or pharmacist if you have questions. COMMON BRAND NAME(S): Gemzar What should I tell my health care provider before I take this medicine? They need to know if you have any of these conditions: -blood disorders -infection -kidney disease -liver disease -recent or ongoing radiation therapy -an unusual or allergic reaction to  gemcitabine, other chemotherapy, other medicines, foods, dyes, or preservatives -pregnant or trying to get pregnant -breast-feeding How should I use this medicine? This drug is given as an infusion into a vein. It is administered in a hospital or clinic by a specially trained health care professional. Talk to your pediatrician regarding the use of this medicine in children. Special care may be needed. Overdosage: If you think you have taken too much of this medicine contact a poison control center or emergency room at once. NOTE: This medicine is only for you. Do not share this medicine with others. What if I miss a dose? It is important not to miss your dose. Call your doctor or health care professional if you are unable to keep an appointment. What may interact with this medicine? -medicines to increase blood counts like filgrastim, pegfilgrastim, sargramostim -some other chemotherapy drugs like cisplatin -vaccines Talk to your doctor or health care professional before taking any of these medicines: -acetaminophen -aspirin -ibuprofen -ketoprofen -naproxen This list may not describe all possible interactions. Give your health care provider a list of all the medicines, herbs, non-prescription drugs, or dietary supplements you use. Also tell them if you smoke, drink alcohol, or use illegal drugs. Some items may interact with your medicine. What should I watch for while using this medicine? Visit your doctor for checks on your progress. This drug may make you feel generally unwell. This is not uncommon, as chemotherapy can affect healthy cells as well as cancer cells. Report any side effects. Continue your course of treatment even though you feel ill unless your doctor tells you to stop.  In some cases, you may be given additional medicines to help with side effects. Follow all directions for their use. Call your doctor or health care professional for advice if you get a fever, chills or sore  throat, or other symptoms of a cold or flu. Do not treat yourself. This drug decreases your body's ability to fight infections. Try to avoid being around people who are sick. This medicine may increase your risk to bruise or bleed. Call your doctor or health care professional if you notice any unusual bleeding. Be careful brushing and flossing your teeth or using a toothpick because you may get an infection or bleed more easily. If you have any dental work done, tell your dentist you are receiving this medicine. Avoid taking products that contain aspirin, acetaminophen, ibuprofen, naproxen, or ketoprofen unless instructed by your doctor. These medicines may hide a fever. Women should inform their doctor if they wish to become pregnant or think they might be pregnant. There is a potential for serious side effects to an unborn child. Talk to your health care professional or pharmacist for more information. Do not breast-feed an infant while taking this medicine. What side effects may I notice from receiving this medicine? Side effects that you should report to your doctor or health care professional as soon as possible: -allergic reactions like skin rash, itching or hives, swelling of the face, lips, or tongue -low blood counts - this medicine may decrease the number of white blood cells, red blood cells and platelets. You may be at increased risk for infections and bleeding. -signs of infection - fever or chills, cough, sore throat, pain or difficulty passing urine -signs of decreased platelets or bleeding - bruising, pinpoint red spots on the skin, black, tarry stools, blood in the urine -signs of decreased red blood cells - unusually weak or tired, fainting spells, lightheadedness -breathing problems -chest pain -mouth sores -nausea and vomiting -pain, swelling, redness at site where injected -pain, tingling, numbness in the hands or feet -stomach pain -swelling of ankles, feet, hands -unusual  bleeding Side effects that usually do not require medical attention (report to your doctor or health care professional if they continue or are bothersome): -constipation -diarrhea -hair loss -loss of appetite -stomach upset This list may not describe all possible side effects. Call your doctor for medical advice about side effects. You may report side effects to FDA at 1-800-FDA-1088. Where should I keep my medicine? This drug is given in a hospital or clinic and will not be stored at home. NOTE: This sheet is a summary. It may not cover all possible information. If you have questions about this medicine, talk to your doctor, pharmacist, or health care provider.  2018 Elsevier/Gold Standard (2007-08-08 18:45:54)   Cisplatin injection What is this medicine? CISPLATIN (SIS pla tin) is a chemotherapy drug. It targets fast dividing cells, like cancer cells, and causes these cells to die. This medicine is used to treat many types of cancer like bladder, ovarian, and testicular cancers. This medicine may be used for other purposes; ask your health care provider or pharmacist if you have questions. COMMON BRAND NAME(S): Platinol, Platinol -AQ What should I tell my health care provider before I take this medicine? They need to know if you have any of these conditions: -blood disorders -hearing problems -kidney disease -recent or ongoing radiation therapy -an unusual or allergic reaction to cisplatin, carboplatin, other chemotherapy, other medicines, foods, dyes, or preservatives -pregnant or trying to get pregnant -breast-feeding How  should I use this medicine? This drug is given as an infusion into a vein. It is administered in a hospital or clinic by a specially trained health care professional. Talk to your pediatrician regarding the use of this medicine in children. Special care may be needed. Overdosage: If you think you have taken too much of this medicine contact a poison control  center or emergency room at once. NOTE: This medicine is only for you. Do not share this medicine with others. What if I miss a dose? It is important not to miss a dose. Call your doctor or health care professional if you are unable to keep an appointment. What may interact with this medicine? -dofetilide -foscarnet -medicines for seizures -medicines to increase blood counts like filgrastim, pegfilgrastim, sargramostim -probenecid -pyridoxine used with altretamine -rituximab -some antibiotics like amikacin, gentamicin, neomycin, polymyxin B, streptomycin, tobramycin -sulfinpyrazone -vaccines -zalcitabine Talk to your doctor or health care professional before taking any of these medicines: -acetaminophen -aspirin -ibuprofen -ketoprofen -naproxen This list may not describe all possible interactions. Give your health care provider a list of all the medicines, herbs, non-prescription drugs, or dietary supplements you use. Also tell them if you smoke, drink alcohol, or use illegal drugs. Some items may interact with your medicine. What should I watch for while using this medicine? Your condition will be monitored carefully while you are receiving this medicine. You will need important blood work done while you are taking this medicine. This drug may make you feel generally unwell. This is not uncommon, as chemotherapy can affect healthy cells as well as cancer cells. Report any side effects. Continue your course of treatment even though you feel ill unless your doctor tells you to stop. In some cases, you may be given additional medicines to help with side effects. Follow all directions for their use. Call your doctor or health care professional for advice if you get a fever, chills or sore throat, or other symptoms of a cold or flu. Do not treat yourself. This drug decreases your body's ability to fight infections. Try to avoid being around people who are sick. This medicine may increase your  risk to bruise or bleed. Call your doctor or health care professional if you notice any unusual bleeding. Be careful brushing and flossing your teeth or using a toothpick because you may get an infection or bleed more easily. If you have any dental work done, tell your dentist you are receiving this medicine. Avoid taking products that contain aspirin, acetaminophen, ibuprofen, naproxen, or ketoprofen unless instructed by your doctor. These medicines may hide a fever. Do not become pregnant while taking this medicine. Women should inform their doctor if they wish to become pregnant or think they might be pregnant. There is a potential for serious side effects to an unborn child. Talk to your health care professional or pharmacist for more information. Do not breast-feed an infant while taking this medicine. Drink fluids as directed while you are taking this medicine. This will help protect your kidneys. Call your doctor or health care professional if you get diarrhea. Do not treat yourself. What side effects may I notice from receiving this medicine? Side effects that you should report to your doctor or health care professional as soon as possible: -allergic reactions like skin rash, itching or hives, swelling of the face, lips, or tongue -signs of infection - fever or chills, cough, sore throat, pain or difficulty passing urine -signs of decreased platelets or bleeding - bruising, pinpoint red  spots on the skin, black, tarry stools, nosebleeds -signs of decreased red blood cells - unusually weak or tired, fainting spells, lightheadedness -breathing problems -changes in hearing -gout pain -low blood counts - This drug may decrease the number of white blood cells, red blood cells and platelets. You may be at increased risk for infections and bleeding. -nausea and vomiting -pain, swelling, redness or irritation at the injection site -pain, tingling, numbness in the hands or feet -problems with  balance, movement -trouble passing urine or change in the amount of urine Side effects that usually do not require medical attention (report to your doctor or health care professional if they continue or are bothersome): -changes in vision -loss of appetite -metallic taste in the mouth or changes in taste This list may not describe all possible side effects. Call your doctor for medical advice about side effects. You may report side effects to FDA at 1-800-FDA-1088. Where should I keep my medicine? This drug is given in a hospital or clinic and will not be stored at home. NOTE: This sheet is a summary. It may not cover all possible information. If you have questions about this medicine, talk to your doctor, pharmacist, or health care provider.  2018 Elsevier/Gold Standard (2007-07-04 14:40:54)

## 2016-10-12 ENCOUNTER — Ambulatory Visit (HOSPITAL_COMMUNITY)
Admission: RE | Admit: 2016-10-12 | Discharge: 2016-10-12 | Disposition: A | Discharge status: Home / Self Care | Payer: PPO | Source: Ambulatory Visit | Attending: Hematology & Oncology | Admitting: Hematology & Oncology

## 2016-10-12 ENCOUNTER — Other Ambulatory Visit: Payer: Self-pay | Admitting: *Deleted

## 2016-10-12 DIAGNOSIS — C772 Secondary and unspecified malignant neoplasm of intra-abdominal lymph nodes: Secondary | ICD-10-CM | POA: Insufficient documentation

## 2016-10-12 DIAGNOSIS — C671 Malignant neoplasm of dome of bladder: Secondary | ICD-10-CM | POA: Diagnosis not present

## 2016-10-12 DIAGNOSIS — I700 Atherosclerosis of aorta: Secondary | ICD-10-CM | POA: Diagnosis not present

## 2016-10-12 DIAGNOSIS — C679 Malignant neoplasm of bladder, unspecified: Secondary | ICD-10-CM | POA: Diagnosis not present

## 2016-10-12 LAB — PSA: Prostate Specific Ag, Serum: 0.2 ng/mL (ref 0.0–4.0)

## 2016-10-12 LAB — LIPID PANEL
Chol/HDL Ratio: 4.2 ratio (ref 0.0–5.0)
Cholesterol, Total: 163 mg/dL (ref 100–199)
HDL: 39 mg/dL — ABNORMAL LOW (ref >39)
LDL Calculated: 101 mg/dL — ABNORMAL HIGH (ref 0–99)
Triglycerides: 116 mg/dL (ref 0–149)
VLDL Cholesterol Cal: 23 mg/dL (ref 5–40)

## 2016-10-12 LAB — HEMOGLOBIN A1C
Est. average glucose Bld gHb Est-mCnc: 114 mg/dL
Hemoglobin A1c: 5.6 % (ref 4.8–5.6)

## 2016-10-12 LAB — GLUCOSE, CAPILLARY: Glucose-Capillary: 130 mg/dL — ABNORMAL HIGH (ref 65–99)

## 2016-10-12 MED ORDER — FLUDEOXYGLUCOSE F - 18 (FDG) INJECTION
13.3300 | Freq: Once | INTRAVENOUS | Status: AC
  Administered 2016-10-12: 13.33 via INTRAVENOUS

## 2016-10-12 NOTE — Patient Outreach (Signed)
[facility]) Care Management  10/12/2016  [name] Rose 08-06-1950 [id number]   Member identified as high risk according to [employer] health questionnaire.  Call placed to member to introduce [facility] care management services and perform screening for additional needs.  Phone rings busy, unable to leave message.  Will make 2nd attempt within the next week.  [name], [place], MSN [place] [phone]

## 2016-10-14 ENCOUNTER — Ambulatory Visit: Payer: PPO

## 2016-10-14 ENCOUNTER — Telehealth: Payer: Self-pay

## 2016-10-14 NOTE — Telephone Encounter (Signed)
Spoke with wife, [name] Rose was out of the house but she reports he is feeling well. NO concerns or new questions.

## 2016-10-15 ENCOUNTER — Other Ambulatory Visit: Payer: Self-pay | Admitting: *Deleted

## 2016-10-15 ENCOUNTER — Encounter: Payer: Self-pay | Admitting: Hematology & Oncology

## 2016-10-15 NOTE — Patient Outreach (Signed)
[facility]) Care Management  10/15/2016  [name] Rose 1950/10/31 [id number]   2nd attempt made to call placed to introduce [facility] care management services and perform telephone screening.  No answer, HIPAA compliant voice message left.  Will await call back, if no call back, will follow up within the next week.  [name], [place], MSN [place] [phone]

## 2016-10-16 ENCOUNTER — Encounter (HOSPITAL_COMMUNITY): Payer: Self-pay

## 2016-10-16 ENCOUNTER — Emergency Department (HOSPITAL_COMMUNITY): Payer: PPO

## 2016-10-16 ENCOUNTER — Emergency Department (HOSPITAL_COMMUNITY)
Admission: EM | Admit: 2016-10-16 | Discharge: 2016-10-17 | Disposition: A | Discharge status: Home / Self Care | Payer: PPO | Source: Home / Self Care | Attending: Emergency Medicine | Admitting: Emergency Medicine

## 2016-10-16 DIAGNOSIS — C679 Malignant neoplasm of bladder, unspecified: Secondary | ICD-10-CM | POA: Diagnosis not present

## 2016-10-16 DIAGNOSIS — R569 Unspecified convulsions: Secondary | ICD-10-CM | POA: Diagnosis not present

## 2016-10-16 DIAGNOSIS — R509 Fever, unspecified: Secondary | ICD-10-CM

## 2016-10-16 LAB — URINALYSIS, ROUTINE W REFLEX MICROSCOPIC
Bilirubin Urine: NEGATIVE
Glucose, UA: NEGATIVE mg/dL
Hgb urine dipstick: NEGATIVE
Ketones, ur: NEGATIVE mg/dL
Leukocytes, UA: NEGATIVE
Nitrite: NEGATIVE
Protein, ur: NEGATIVE mg/dL
Specific Gravity, Urine: 1.017 (ref 1.005–1.030)
pH: 6 (ref 5.0–8.0)

## 2016-10-16 LAB — COMPREHENSIVE METABOLIC PANEL
ALT: 42 U/L (ref 17–63)
AST: 38 U/L (ref 15–41)
Albumin: 3.3 g/dL — ABNORMAL LOW (ref 3.5–5.0)
Alkaline Phosphatase: 47 U/L (ref 38–126)
Anion gap: 7 (ref 5–15)
BUN: 31 mg/dL — ABNORMAL HIGH (ref 6–20)
CO2: 29 mmol/L (ref 22–32)
Calcium: 8.3 mg/dL — ABNORMAL LOW (ref 8.9–10.3)
Chloride: 96 mmol/L — ABNORMAL LOW (ref 101–111)
Creatinine, Ser: 0.96 mg/dL (ref 0.61–1.24)
GFR calc Af Amer: >60 mL/min (ref >60)
GFR calc non Af Amer: >60 mL/min (ref >60)
Glucose, Bld: 98 mg/dL (ref 65–99)
Potassium: 3.9 mmol/L (ref 3.5–5.1)
Sodium: 132 mmol/L — ABNORMAL LOW (ref 135–145)
Total Bilirubin: 0.6 mg/dL (ref 0.3–1.2)
Total Protein: 6.1 g/dL — ABNORMAL LOW (ref 6.5–8.1)

## 2016-10-16 LAB — CBC WITH DIFFERENTIAL/PLATELET
Basophils Absolute: 0 10*3/uL (ref 0.0–0.1)
Basophils Relative: 0 %
Eosinophils Absolute: 0 10*3/uL (ref 0.0–0.7)
Eosinophils Relative: 0 %
HCT: 37.4 % — ABNORMAL LOW (ref 39.0–52.0)
Hemoglobin: 12.9 g/dL — ABNORMAL LOW (ref 13.0–17.0)
Lymphocytes Relative: 8 %
Lymphs Abs: 0.4 10*3/uL — ABNORMAL LOW (ref 0.7–4.0)
MCH: 28.4 pg (ref 26.0–34.0)
MCHC: 34.5 g/dL (ref 30.0–36.0)
MCV: 82.4 fL (ref 78.0–100.0)
Monocytes Absolute: 0 10*3/uL — ABNORMAL LOW (ref 0.1–1.0)
Monocytes Relative: 0 %
Neutro Abs: 4.7 10*3/uL (ref 1.7–7.7)
Neutrophils Relative %: 92 %
Platelets: 151 10*3/uL (ref 150–400)
RBC: 4.54 MIL/uL (ref 4.22–5.81)
RDW: 12.5 % (ref 11.5–15.5)
WBC: 5.2 10*3/uL (ref 4.0–10.5)

## 2016-10-16 LAB — I-STAT CG4 LACTIC ACID, ED: Lactic Acid, Venous: 1.86 mmol/L (ref 0.5–1.9)

## 2016-10-16 MED ORDER — SODIUM CHLORIDE 0.9 % IV BOLUS (SEPSIS)
1000.0000 mL | Freq: Once | INTRAVENOUS | Status: AC
  Administered 2016-10-16: 1000 mL via INTRAVENOUS

## 2016-10-16 MED ORDER — ACETAMINOPHEN 325 MG PO TABS
650.0000 mg | ORAL_TABLET | Freq: Once | ORAL | Status: DC | PRN
  Filled 2016-10-16 (×2): qty 2

## 2016-10-16 NOTE — ED Provider Notes (Signed)
  Physical Exam  BP (!) 183/87   Pulse 74   Temp 98.8 F (37.1 C) (Oral)   Resp 18   Ht $R'6\' 2"'Ms$  (1.88 m)   Wt 120.2 kg (265 lb)   SpO2 99%   BMI 34.02 kg/m   Physical Exam  ED Course  Procedures MDM Care assumed at 7 am. Patient has hx of bladder cancer on chemo (last chemo 6 days ago). Had fever 101 yesterday. WBC nl, lactate nl. Sign out pending UA, CXR.   8:34 AM CXR and UA clear. Afebrile, well appearing. Cultures sent. Called Dr. Burr Medico from oncology, who wants me to send message to [name] and hold abx for now. Patient has follow up with [name] in 2 days. Gave strict return precautions.        Drenda Freeze, MD 10/16/16 [phone]

## 2016-10-16 NOTE — ED Notes (Signed)
Pt c/o fever since 2300 last night. Also c/o some nausea. Hx of bladder cancer.

## 2016-10-16 NOTE — Discharge Instructions (Signed)
Continue tylenol, motrin as needed for fever.   We sent off blood cultures and if they are positive, you will be called.   See [name] on Monday for follow up   Return to ER if you have persistent fever for 3 days, vomiting, chest pain, trouble breathing, trouble urinating.

## 2016-10-17 ENCOUNTER — Encounter (HOSPITAL_COMMUNITY): Payer: Self-pay

## 2016-10-17 ENCOUNTER — Telehealth (HOSPITAL_BASED_OUTPATIENT_CLINIC_OR_DEPARTMENT_OTHER): Payer: Self-pay | Admitting: *Deleted

## 2016-10-17 ENCOUNTER — Inpatient Hospital Stay (HOSPITAL_COMMUNITY)
Admission: EM | Admit: 2016-10-17 | Discharge: 2016-10-21 | DRG: 871 | Disposition: A | Discharge status: Home Health | Payer: PPO | Attending: Internal Medicine | Admitting: Internal Medicine

## 2016-10-17 DIAGNOSIS — I10 Essential (primary) hypertension: Secondary | ICD-10-CM | POA: Diagnosis not present

## 2016-10-17 DIAGNOSIS — D6481 Anemia due to antineoplastic chemotherapy: Secondary | ICD-10-CM | POA: Diagnosis not present

## 2016-10-17 DIAGNOSIS — Z96642 Presence of left artificial hip joint: Secondary | ICD-10-CM | POA: Diagnosis present

## 2016-10-17 DIAGNOSIS — D6959 Other secondary thrombocytopenia: Secondary | ICD-10-CM | POA: Diagnosis not present

## 2016-10-17 DIAGNOSIS — A327 Listerial sepsis: Secondary | ICD-10-CM | POA: Diagnosis not present

## 2016-10-17 DIAGNOSIS — R7303 Prediabetes: Secondary | ICD-10-CM | POA: Diagnosis not present

## 2016-10-17 DIAGNOSIS — C679 Malignant neoplasm of bladder, unspecified: Secondary | ICD-10-CM | POA: Diagnosis present

## 2016-10-17 DIAGNOSIS — Z9841 Cataract extraction status, right eye: Secondary | ICD-10-CM

## 2016-10-17 DIAGNOSIS — N289 Disorder of kidney and ureter, unspecified: Secondary | ICD-10-CM | POA: Diagnosis present

## 2016-10-17 DIAGNOSIS — A329 Listeriosis, unspecified: Secondary | ICD-10-CM | POA: Diagnosis not present

## 2016-10-17 DIAGNOSIS — Z96653 Presence of artificial knee joint, bilateral: Secondary | ICD-10-CM | POA: Diagnosis present

## 2016-10-17 DIAGNOSIS — Z833 Family history of diabetes mellitus: Secondary | ICD-10-CM

## 2016-10-17 DIAGNOSIS — Z79899 Other long term (current) drug therapy: Secondary | ICD-10-CM

## 2016-10-17 DIAGNOSIS — C778 Secondary and unspecified malignant neoplasm of lymph nodes of multiple regions: Secondary | ICD-10-CM | POA: Diagnosis not present

## 2016-10-17 DIAGNOSIS — D696 Thrombocytopenia, unspecified: Secondary | ICD-10-CM | POA: Diagnosis not present

## 2016-10-17 DIAGNOSIS — R509 Fever, unspecified: Secondary | ICD-10-CM | POA: Diagnosis not present

## 2016-10-17 DIAGNOSIS — C772 Secondary and unspecified malignant neoplasm of intra-abdominal lymph nodes: Secondary | ICD-10-CM | POA: Diagnosis present

## 2016-10-17 DIAGNOSIS — Z8249 Family history of ischemic heart disease and other diseases of the circulatory system: Secondary | ICD-10-CM

## 2016-10-17 DIAGNOSIS — R51 Headache: Secondary | ICD-10-CM | POA: Diagnosis not present

## 2016-10-17 DIAGNOSIS — Z825 Family history of asthma and other chronic lower respiratory diseases: Secondary | ICD-10-CM | POA: Diagnosis not present

## 2016-10-17 DIAGNOSIS — Z961 Presence of intraocular lens: Secondary | ICD-10-CM | POA: Diagnosis present

## 2016-10-17 DIAGNOSIS — Z9079 Acquired absence of other genital organ(s): Secondary | ICD-10-CM | POA: Diagnosis not present

## 2016-10-17 DIAGNOSIS — R945 Abnormal results of liver function studies: Secondary | ICD-10-CM

## 2016-10-17 DIAGNOSIS — R5383 Other fatigue: Secondary | ICD-10-CM | POA: Diagnosis present

## 2016-10-17 DIAGNOSIS — Z95828 Presence of other vascular implants and grafts: Secondary | ICD-10-CM | POA: Diagnosis not present

## 2016-10-17 DIAGNOSIS — M1990 Unspecified osteoarthritis, unspecified site: Secondary | ICD-10-CM | POA: Diagnosis not present

## 2016-10-17 DIAGNOSIS — Z9842 Cataract extraction status, left eye: Secondary | ICD-10-CM

## 2016-10-17 DIAGNOSIS — R630 Anorexia: Secondary | ICD-10-CM | POA: Diagnosis not present

## 2016-10-17 DIAGNOSIS — R7881 Bacteremia: Secondary | ICD-10-CM

## 2016-10-17 DIAGNOSIS — Z87891 Personal history of nicotine dependence: Secondary | ICD-10-CM

## 2016-10-17 DIAGNOSIS — Z5181 Encounter for therapeutic drug level monitoring: Secondary | ICD-10-CM | POA: Diagnosis not present

## 2016-10-17 DIAGNOSIS — R197 Diarrhea, unspecified: Secondary | ICD-10-CM | POA: Diagnosis not present

## 2016-10-17 DIAGNOSIS — R7989 Other specified abnormal findings of blood chemistry: Secondary | ICD-10-CM | POA: Diagnosis present

## 2016-10-17 DIAGNOSIS — T451X5A Adverse effect of antineoplastic and immunosuppressive drugs, initial encounter: Secondary | ICD-10-CM | POA: Diagnosis not present

## 2016-10-17 DIAGNOSIS — Z8546 Personal history of malignant neoplasm of prostate: Secondary | ICD-10-CM

## 2016-10-17 DIAGNOSIS — E876 Hypokalemia: Secondary | ICD-10-CM | POA: Diagnosis not present

## 2016-10-17 DIAGNOSIS — R519 Headache, unspecified: Secondary | ICD-10-CM

## 2016-10-17 DIAGNOSIS — D630 Anemia in neoplastic disease: Secondary | ICD-10-CM | POA: Diagnosis present

## 2016-10-17 DIAGNOSIS — R569 Unspecified convulsions: Secondary | ICD-10-CM | POA: Diagnosis not present

## 2016-10-17 DIAGNOSIS — E860 Dehydration: Secondary | ICD-10-CM | POA: Diagnosis present

## 2016-10-17 LAB — BLOOD CULTURE ID PANEL (REFLEXED)
Acinetobacter baumannii: NOT DETECTED
Candida albicans: NOT DETECTED
Candida glabrata: NOT DETECTED
Candida krusei: NOT DETECTED
Candida parapsilosis: NOT DETECTED
Candida tropicalis: NOT DETECTED
Enterobacter cloacae complex: NOT DETECTED
Enterobacteriaceae species: NOT DETECTED
Enterococcus species: NOT DETECTED
Escherichia coli: NOT DETECTED
Haemophilus influenzae: NOT DETECTED
Klebsiella oxytoca: NOT DETECTED
Klebsiella pneumoniae: NOT DETECTED
Listeria monocytogenes: DETECTED — AB
Neisseria meningitidis: NOT DETECTED
Proteus species: NOT DETECTED
Pseudomonas aeruginosa: NOT DETECTED
Serratia marcescens: NOT DETECTED
Staphylococcus aureus (BCID): NOT DETECTED
Staphylococcus species: NOT DETECTED
Streptococcus agalactiae: NOT DETECTED
Streptococcus pneumoniae: NOT DETECTED
Streptococcus pyogenes: NOT DETECTED
Streptococcus species: NOT DETECTED

## 2016-10-17 LAB — COMPREHENSIVE METABOLIC PANEL
ALT: 74 U/L — ABNORMAL HIGH (ref 17–63)
AST: 76 U/L — ABNORMAL HIGH (ref 15–41)
Albumin: 3.2 g/dL — ABNORMAL LOW (ref 3.5–5.0)
Alkaline Phosphatase: 45 U/L (ref 38–126)
Anion gap: 9 (ref 5–15)
BUN: 28 mg/dL — ABNORMAL HIGH (ref 6–20)
CO2: 26 mmol/L (ref 22–32)
Calcium: 8.1 mg/dL — ABNORMAL LOW (ref 8.9–10.3)
Chloride: 95 mmol/L — ABNORMAL LOW (ref 101–111)
Creatinine, Ser: 1.22 mg/dL (ref 0.61–1.24)
GFR calc Af Amer: >60 mL/min (ref >60)
GFR calc non Af Amer: >60 mL/min (ref >60)
Glucose, Bld: 146 mg/dL — ABNORMAL HIGH (ref 65–99)
Potassium: 3.2 mmol/L — ABNORMAL LOW (ref 3.5–5.1)
Sodium: 130 mmol/L — ABNORMAL LOW (ref 135–145)
Total Bilirubin: 0.8 mg/dL (ref 0.3–1.2)
Total Protein: 6.3 g/dL — ABNORMAL LOW (ref 6.5–8.1)

## 2016-10-17 LAB — URINALYSIS, ROUTINE W REFLEX MICROSCOPIC
Bilirubin Urine: NEGATIVE
Glucose, UA: 50 mg/dL — AB
Ketones, ur: 20 mg/dL — AB
Leukocytes, UA: NEGATIVE
Nitrite: NEGATIVE
Protein, ur: 100 mg/dL — AB
Specific Gravity, Urine: 1.023 (ref 1.005–1.030)
pH: 5 (ref 5.0–8.0)

## 2016-10-17 LAB — CBC WITH DIFFERENTIAL/PLATELET
Basophils Absolute: 0 10*3/uL (ref 0.0–0.1)
Basophils Relative: 0 %
Eosinophils Absolute: 0 10*3/uL (ref 0.0–0.7)
Eosinophils Relative: 0 %
HCT: 39.4 % (ref 39.0–52.0)
Hemoglobin: 14.3 g/dL (ref 13.0–17.0)
Lymphocytes Relative: 5 %
Lymphs Abs: 0.3 10*3/uL — ABNORMAL LOW (ref 0.7–4.0)
MCH: 29 pg (ref 26.0–34.0)
MCHC: 36.3 g/dL — ABNORMAL HIGH (ref 30.0–36.0)
MCV: 79.9 fL (ref 78.0–100.0)
Monocytes Absolute: 0.1 10*3/uL (ref 0.1–1.0)
Monocytes Relative: 1 %
Neutro Abs: 6.2 10*3/uL (ref 1.7–7.7)
Neutrophils Relative %: 94 %
Platelets: 100 10*3/uL — ABNORMAL LOW (ref 150–400)
RBC: 4.93 MIL/uL (ref 4.22–5.81)
RDW: 12.5 % (ref 11.5–15.5)
WBC: 6.6 10*3/uL (ref 4.0–10.5)

## 2016-10-17 LAB — URINE CULTURE: Culture: NO GROWTH

## 2016-10-17 LAB — C DIFFICILE QUICK SCREEN W PCR REFLEX
C Diff antigen: NEGATIVE
C Diff interpretation: NOT DETECTED
C Diff toxin: NEGATIVE

## 2016-10-17 LAB — I-STAT CG4 LACTIC ACID, ED: Lactic Acid, Venous: 1.77 mmol/L (ref 0.5–1.9)

## 2016-10-17 LAB — LIPASE, BLOOD: Lipase: 30 U/L (ref 11–51)

## 2016-10-17 MED ORDER — LORAZEPAM 0.5 MG PO TABS: 0.5000 mg | ORAL_TABLET | Freq: Four times a day (QID) | ORAL | Status: DC | PRN

## 2016-10-17 MED ORDER — SODIUM CHLORIDE 0.9 % IV BOLUS (SEPSIS)
1000.0000 mL | Freq: Once | INTRAVENOUS | Status: AC
  Administered 2016-10-17: 1000 mL via INTRAVENOUS

## 2016-10-17 MED ORDER — ONDANSETRON HCL 4 MG PO TABS: 4.0000 mg | ORAL_TABLET | Freq: Four times a day (QID) | ORAL | Status: DC | PRN

## 2016-10-17 MED ORDER — POTASSIUM CHLORIDE CRYS ER 20 MEQ PO TBCR
40.0000 meq | EXTENDED_RELEASE_TABLET | Freq: Once | ORAL | Status: AC
  Administered 2016-10-17: 40 meq via ORAL
  Filled 2016-10-17: qty 2

## 2016-10-17 MED ORDER — SODIUM CHLORIDE 0.9 % IV SOLN
2.0000 g | INTRAVENOUS | Status: DC
  Administered 2016-10-17 – 2016-10-21 (×25): 2 g via INTRAVENOUS
  Filled 2016-10-17 (×28): qty 2000

## 2016-10-17 MED ORDER — HEPARIN SODIUM (PORCINE) 5000 UNIT/ML IJ SOLN
5000.0000 [IU] | Freq: Three times a day (TID) | INTRAMUSCULAR | Status: DC
  Administered 2016-10-17 – 2016-10-18 (×3): 5000 [IU] via SUBCUTANEOUS
  Filled 2016-10-17 (×3): qty 1

## 2016-10-17 MED ORDER — SODIUM CHLORIDE 0.9 % IV SOLN
INTRAVENOUS | Status: DC
  Administered 2016-10-17: 17:00:00 via INTRAVENOUS
  Administered 2016-10-19: 1000 mL via INTRAVENOUS
  Administered 2016-10-19 – 2016-10-20 (×2): via INTRAVENOUS

## 2016-10-17 MED ORDER — ONDANSETRON HCL 4 MG/2ML IJ SOLN: 4.0000 mg | Freq: Four times a day (QID) | INTRAMUSCULAR | Status: DC | PRN

## 2016-10-17 MED ORDER — HYDROCODONE-ACETAMINOPHEN 5-325 MG PO TABS: 1.0000 | ORAL_TABLET | Freq: Four times a day (QID) | ORAL | Status: DC | PRN

## 2016-10-17 MED ORDER — ACETAMINOPHEN 325 MG PO TABS: 650.0000 mg | ORAL_TABLET | Freq: Four times a day (QID) | ORAL | Status: DC | PRN

## 2016-10-17 MED ORDER — ACETAMINOPHEN 650 MG RE SUPP: 650.0000 mg | Freq: Four times a day (QID) | RECTAL | Status: DC | PRN

## 2016-10-17 MED ORDER — SODIUM CHLORIDE 0.9 % IV SOLN
1.0000 g | Freq: Once | INTRAVENOUS | Status: AC
  Administered 2016-10-17: 1 g via INTRAVENOUS
  Filled 2016-10-17: qty 10

## 2016-10-17 NOTE — H&P (Addendum)
[facility] History and Physical   Patient: [name] Rose TFT:[id number]   PCP: Shirline Frees, MD DOB: [birth year]   DOA: 10/17/2016   DOS: 10/17/2016   DOS: the patient was seen and examined on 10/17/2016  Patient coming from: The patient is coming from home.  Chief Complaint: Diarrhea and fatigue with fever  HPI: [name] Rose is a 66 y.o. male with Past medical history of bladder cancer, recently started on chemotherapy. Recent Port-A-Cath placement, hypertension. Patient presented with complains of fever ongoing for last 3 days. Temperature up to 101. Recently had chemotherapy on July 2 cisplatin with Gemzar. For this he had Port-A-Cath placement on 10/07/2016 Patient started having fever as well as in last fatigue, was seen in the ER. Initial workup was negative and patient was recommended to go home Tylenol and follow-up with oncology the next day. Blood cultures were performed, which came back positive the next day for Listeria and the patient was requested to come back to the hospital. At the time of my evaluation patient mentions that he continues to have fever and fatigue. No chest pain no abdominal pain no nausea no vomiting. Has significantly poor appetite. Also noted diarrhea more than 3 episodes yesterday and also continues to have today. Diarrhea has been described as loose watery, no mucousy no blood in the stool. Denies having any rash anywhere. No burning urination. Patient denies any use of fresh food product other than a salad that they are not sure where was from. No fresh juice. No recent travel. Denies any headache, neck stiffness or neck pain, photophobia or phonophobia.  ED Course: Blood cultures were performed, ID was consulted, patient was started on ampicillin and was referred for admission.  At his baseline ambulates without support And is independent for most of his ADL; manages his medication on his own.  Review of Systems: as mentioned in the history of  present illness.  All other systems reviewed and are negative.  Past Medical History:  Diagnosis Date  . Arthritis   . 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 ([place]) 09/29/2016  . Bladder tumor   . Goals of care, counseling/discussion 09/30/2016  . History of prostate cancer followed by pcp dr [name]-  per pt last PSA undetectable   dx 2008-- (Stage T1c, Gleason 3+3,  PSA 4.58, vol 99cc)  s/p  radical prostatectomy (nerve sparing bilateral)   . Hypertension   . Lower urinary tract symptoms (LUTS)   . Pre-diabetes   . Wears glasses    Past Surgical History:  Procedure Laterality Date  . CATARACT EXTRACTION W/ INTRAOCULAR LENS  IMPLANT, BILATERAL Bilateral 2011  . [place]  . IR FLUORO GUIDE PORT INSERTION RIGHT  10/07/2016  . IR US GUIDE VASC ACCESS RIGHT  10/07/2016  . KNEE ARTHROSCOPY Bilateral right 2006;  left 02-15-2007  . [place];  1990;  1983  . ROBOT ASSISTED LAPAROSCOPIC RADICAL PROSTATECTOMY  06/20/2006   bilateral nerve sparing  . TOTAL HIP ARTHROPLASTY Left 03/03/2015   Procedure: LEFT TOTAL HIP ARTHROPLASTY ANTERIOR APPROACH;  Surgeon: Dorna Leitz, MD;  Location: [facility];  Service: Orthopedics;  Laterality: Left;  . TOTAL KNEE ARTHROPLASTY Bilateral left 08-13-2009;  right 12-26-2009  . TRANSURETHRAL RESECTION OF BLADDER TUMOR N/A 09/20/2016   Procedure: TRANSURETHRAL RESECTION OF BLADDER TUMOR (TURBT);  Surgeon: Franchot Gallo, MD;  Location: [facility];  Service: Urology;  Laterality: N/A;   Social History:  reports that he quit  smoking about 32 years ago. His smoking use included Cigarettes. He quit after 16.00 years of use. He has never used smokeless tobacco. He reports that he drinks alcohol. He reports that he does not use drugs.  No Known Allergies  History reviewed. No pertinent family history.   Prior to Admission medications   Medication Sig Start Date End Date Taking? Authorizing  Provider  amLODipine (NORVASC) 10 MG tablet Take 10 mg by mouth every morning.   Yes [provider]  bisoprolol-hydrochlorothiazide (ZIAC) 10-6.25 MG tablet Take 1 tablet by mouth every evening.    Yes [provider]  Cinnamon 500 MG TABS Take 1 tablet by mouth 2 (two) times daily.    Yes [provider]  dexamethasone (DECADRON) 4 MG tablet Take 2 tablets by mouth once a day on the day after cisplatin chemotherapy and then take 2 tablets two times a day for 2 days. Take with food. 10/06/16  Yes Volanda Napoleon, MD  losartan (COZAAR) 100 MG tablet Take 100 mg by mouth every morning.    Yes [provider]  MAGNESIUM PO Take 400 mg by mouth at bedtime. Leg cramps   Yes [provider]  Naproxen Sodium (ALEVE) 220 MG CAPS Take 220 mg by mouth 2 (two) times daily.    Yes [provider]  prochlorperazine (COMPAZINE) 10 MG tablet Take 1 tablet (10 mg total) by mouth every 6 (six) hours as needed (Nausea or vomiting). 10/06/16  Yes Volanda Napoleon, MD  lidocaine-prilocaine (EMLA) cream Apply to affected area once Patient taking differently: Apply 1 application topically once. Port 10/11/16   Volanda Napoleon, MD  LORazepam (ATIVAN) 0.5 MG tablet Take 1 tablet (0.5 mg total) by mouth every 6 (six) hours as needed (Nausea or vomiting). 10/06/16   Volanda Napoleon, MD  ondansetron (ZOFRAN) 8 MG tablet Take 1 tablet (8 mg total) by mouth 2 (two) times daily as needed. Start on the third day after cisplatin chemotherapy. 10/06/16   Volanda Napoleon, MD    Physical Exam: Vitals:   10/17/16 0913 10/17/16 1153 10/17/16 1222  BP: 121/74 (!) 147/88 (!) 144/95  Pulse: 85 (!) 101 (!) 110  Resp: $Remo'20 16 18  'vMQHH$ Temp: 98.8 F (37.1 C) 99.3 F (37.4 C) (!) 101.2 F (38.4 C)  TempSrc: Oral Oral Oral  SpO2: 98% 97% 98%  Weight:   116.8 kg (257 lb 8 oz)  Height:   '6\' 4"'$  (1.93 m)    General: Alert, Awake and Oriented to Time, Place and Person. Appear in severe  distress, affect flat Eyes: PERRL, Conjunctiva normal ENT: Oral Mucosa clear dry. Neck: no JVD, no Abnormal Mass Or lumps Cardiovascular: S1 and S2 Present, no Murmur, Peripheral Pulses Present Respiratory: normal respiratory effort, Bilateral Air entry equal and Decreased, no use of accessory muscle, Clear to Auscultation, no Crackles, no wheezes Abdomen: Bowel Sound present, Soft and no tenderness, no hernia Skin: no redness, no Rash, no induration Extremities: no Pedal edema, no calf tenderness Neurologic: Grossly no focal neuro deficit. Bilaterally Equal motor strength, generalized weakness  Labs on Admission:  CBC:  Recent Labs Lab 10/11/16 0749 10/16/16 0516 10/17/16 1005  WBC 5.1 5.2 6.6  NEUTROABS 2.8 4.7 6.2  HGB 13.4 12.9* 14.3  HCT 39.9 37.4* 39.4  MCV 86 82.4 79.9  PLT 176 151 160*   Basic Metabolic Panel:  Recent Labs Lab 10/11/16 0749 10/16/16 0516 10/17/16 1005  NA 137 132* 130*  K 3.6 3.9  3.2*  CL 100 96* 95*  CO2 $Re'29 29 26  'ecN$ GLUCOSE 136* 98 146*  BUN 14 31* 28*  CREATININE 1.2 0.96 1.22  CALCIUM 9.1 8.3* 8.1*   GFR: Estimated Creatinine Clearance: 84.4 mL/min (by C-G formula based on SCr of 1.22 mg/dL). Liver Function Tests:  Recent Labs Lab 10/11/16 0749 10/16/16 0516 10/17/16 1005  AST 36 38 76*  ALT 28 42 74*  ALKPHOS 69 47 45  BILITOT 0.80 0.6 0.8  PROT 6.5 6.1* 6.3*  ALBUMIN 3.4 3.3* 3.2*    Recent Labs Lab 10/17/16 1005  LIPASE 30   No results for input(s): AMMONIA in the last 168 hours. Coagulation Profile: No results for input(s): INR, PROTIME in the last 168 hours. Cardiac Enzymes: No results for input(s): CKTOTAL, CKMB, CKMBINDEX, TROPONINI in the last 168 hours. BNP (last 3 results) No results for input(s): PROBNP in the last 8760 hours. HbA1C: No results for input(s): HGBA1C in the last 72 hours. CBG:  Recent Labs Lab 10/12/16 0749  GLUCAP 130*   Lipid Profile: No results for input(s): CHOL, HDL, LDLCALC,  TRIG, CHOLHDL, LDLDIRECT in the last 72 hours. Thyroid Function Tests: No results for input(s): TSH, T4TOTAL, FREET4, T3FREE, THYROIDAB in the last 72 hours. Anemia Panel: No results for input(s): VITAMINB12, FOLATE, FERRITIN, TIBC, IRON, RETICCTPCT in the last 72 hours. Urine analysis:    Component Value Date/Time   COLORURINE YELLOW 10/17/2016 1420   APPEARANCEUR HAZY (A) 10/17/2016 1420   LABSPEC 1.023 10/17/2016 1420   PHURINE 5.0 10/17/2016 1420   GLUCOSEU 50 (A) 10/17/2016 1420   HGBUR MODERATE (A) 10/17/2016 1420   BILIRUBINUR NEGATIVE 10/17/2016 1420   KETONESUR 20 (A) 10/17/2016 1420   PROTEINUR 100 (A) 10/17/2016 1420   UROBILINOGEN 0.2 01/31/2010 0624   NITRITE NEGATIVE 10/17/2016 1420   LEUKOCYTESUR NEGATIVE 10/17/2016 1420    Radiological Exams on Admission: Dg Chest 2 View  Result Date: 10/16/2016 CLINICAL DATA:  Patient with new onset fever. EXAM: CHEST  2 VIEW COMPARISON:  Chest radiograph 02/26/2015. FINDINGS: Right anterior chest wall Port-A-Cath is present tip projecting over the superior vena cava. Stable cardiac and mediastinal contours. No consolidative pulmonary opacities. No pleural effusion or pneumothorax. Thoracic spine degenerative changes. IMPRESSION: No acute cardiopulmonary process. Electronically Signed   By: Lovey Newcomer M.D.   On: 10/16/2016 08:16   Assessment/Plan 1. 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 Meeting sepsis criteria on admission. Patient so far has been given 3 L normal saline bolus. We will continue with aggressive IV hydration. Started on IV ampicillin, may need to add IV gentamicin. Infectious diseases consulted and appreciated input. Patient already has repeat blood cultures performed today although would require repeat culture in next 1-2 days to ensure clearance after starting the antibiotics. No evidence of infection elsewhere.  2.diarrhea. Most likely due  to Listeria. Due to his immunosuppressed status I will rule out C. difficile as well as get GI pathogen panel. Once C. difficile is ruled out, can give Imodium.  3.mild renal insufficiency. Dehydration. Due to diarrhea and prerenal etiology. Mild elevation of serum creatinine as well as BUN. Aggressive IV hydration. Monitor at present.  4.elevated LFTs. Likely in the setting of infection. Initially elevated. At present continue to monitor.  5.hypocalcemia. Hypokalemia. Both replaced, daily monitoring.  6.bladder cancer with metastasis. Receiving chemotherapy, 4 cycles completed on 10/08/2016. His chemotherapy can cause hepatic dysfunction as well as diarrhea. At present chemotherapy on hold,  patient's primary oncologist added to the care team. A monitor.   7. Thrombocytopenia. Platelets are 100, monitor. If counts last then 100 may require discontinuation of DVT prophylaxis. Possibly leukocytosis is absent despite sepsis due to chemotherapy as well.  Nutrition: regular diet DVT Prophylaxis: subcutaneous Heparin  Advance goals of care discussion: full code   Consults: Infectious disease, oncology added to the list.  Family Communication: family was present at bedside, at the time of interview.  Opportunity was given to ask question and all questions were answered satisfactorily.  Disposition: Admitted as inpatient, med-surge unit. Likely to be discharged home, in 3-4 days.  Author: Berle Mull, MD [facility] Pager: [phone] 10/17/2016  If 7PM-7AM, please contact night-coverage [email or web address] Password [email or web address]

## 2016-10-17 NOTE — ED Provider Notes (Signed)
[name] Provider Note   CSN: [id number] Arrival date & time: 10/17/16  0859     History   Chief Complaint Chief Complaint  Patient presents with  . Abnormal Lab    HPI [name] Rose is a 66 y.o. male history of bladder cancer on chemotherapy (last chemo a week ago), here presenting with positive blood cultures. Patient started running a fever 2 days ago. Patient came in yesterday and was seen by Dr. Marcina Millard and myself. He had nl WBC count and nl lactate. Blood culture came back positive as Listeria this morning. Since discharge yesterday, patient has been running [id number] fevers. He started having diarrhea since yesterday. He denies eating any uncooked food but did have salad with lettuce several days ago. Denies any vomiting. Denies any neck pain or stiffness.  The history is provided by the patient.    Past Medical History:  Diagnosis Date  . Arthritis   . 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 ([place]) 09/29/2016  . Bladder tumor   . Goals of care, counseling/discussion 09/30/2016  . History of prostate cancer followed by pcp dr [name]-  per pt last PSA undetectable   dx 2008-- (Stage T1c, Gleason 3+3,  PSA 4.58, vol 99cc)  s/p  radical prostatectomy (nerve sparing bilateral)   . Hypertension   . Lower urinary tract symptoms (LUTS)   . Pre-diabetes   . Wears glasses     Patient Active Problem List   Diagnosis Date Noted  . Goals of care, counseling/discussion 09/30/2016  . 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 ([place]) 09/29/2016  . Cancer of dome of urinary bladder ([place]) 09/20/2016  . Postoperative anemia due to acute blood loss 03/04/2015  . Hypokalemia 03/04/2015  . 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 CATARACT EXTRACTION W/ INTRAOCULAR LENS  IMPLANT, BILATERAL Bilateral 2011  . [place]  . IR FLUORO GUIDE PORT INSERTION RIGHT   10/07/2016  . IR US GUIDE VASC ACCESS RIGHT  10/07/2016  . KNEE ARTHROSCOPY Bilateral right 2006;  left 02-15-2007  . [place];  1990;  1983  . ROBOT ASSISTED LAPAROSCOPIC RADICAL PROSTATECTOMY  06/20/2006   bilateral nerve sparing  . TOTAL HIP ARTHROPLASTY Left 03/03/2015   Procedure: LEFT TOTAL HIP ARTHROPLASTY ANTERIOR APPROACH;  Surgeon: Dorna Leitz, MD;  Location: [facility];  Service: Orthopedics;  Laterality: Left;  . TOTAL KNEE ARTHROPLASTY Bilateral left 08-13-2009;  right 12-26-2009  . TRANSURETHRAL RESECTION OF BLADDER TUMOR N/A 09/20/2016   Procedure: TRANSURETHRAL RESECTION OF BLADDER TUMOR (TURBT);  Surgeon: Franchot Gallo, MD;  Location: [place];  Service: Urology;  Laterality: N/A;       Home Medications    Prior to Admission medications   Medication Sig Start Date End Date Taking? Authorizing Provider  amLODipine (NORVASC) 10 MG tablet Take 10 mg by mouth every morning.    [provider]  bisoprolol-hydrochlorothiazide (ZIAC) 10-6.25 MG tablet Take 1 tablet by mouth every evening.     [provider]  Cinnamon 500 MG TABS Take 1 tablet by mouth 2 (two) times daily.     [provider]  dexamethasone (DECADRON) 4 MG tablet Take 2 tablets by mouth once a day on the day after cisplatin chemotherapy and then take 2 tablets two times a day for 2 days. Take with food. 10/06/16   Volanda Napoleon, MD  lidocaine-prilocaine (EMLA) cream Apply  to affected area once Patient taking differently: Apply 1 application topically once. Port 10/11/16   Volanda Napoleon, MD  LORazepam (ATIVAN) 0.5 MG tablet Take 1 tablet (0.5 mg total) by mouth every 6 (six) hours as needed (Nausea or vomiting). 10/06/16   Volanda Napoleon, MD  losartan (COZAAR) 100 MG tablet Take 100 mg by mouth every morning.     [provider]  MAGNESIUM PO Take 400 mg by mouth at bedtime. Leg cramps    [provider]  Naproxen Sodium (ALEVE)  220 MG CAPS Take 220 mg by mouth 2 (two) times daily.     [provider]  ondansetron (ZOFRAN) 8 MG tablet Take 1 tablet (8 mg total) by mouth 2 (two) times daily as needed. Start on the third day after cisplatin chemotherapy. 10/06/16   Volanda Napoleon, MD  prochlorperazine (COMPAZINE) 10 MG tablet Take 1 tablet (10 mg total) by mouth every 6 (six) hours as needed (Nausea or vomiting). 10/06/16   Volanda Napoleon, MD    Family History History reviewed. No pertinent family history.  Social History Social History  Substance Use Topics  . Smoking status: Former Smoker    Years: 16.00    Types: Cigarettes    Quit date: 09/25/1984  . Smokeless tobacco: Never Used  . Alcohol use Yes     Comment: occasionally     Allergies   Patient has no known allergies.   Review of Systems Review of Systems  Gastrointestinal: Positive for diarrhea.  All other systems reviewed and are negative.    Physical Exam Updated Vital Signs BP 121/74 (BP Location: Right Arm)   Pulse 85   Temp 98.8 F (37.1 C) (Oral)   Resp 20   SpO2 98%   Physical Exam  Constitutional: He is oriented to person, place, and time.  Chronically ill   HENT:  Head: Normocephalic.  MM dry   Eyes: Conjunctivae and EOM are normal. Pupils are equal, round, and reactive to light.  Neck: Normal range of motion. Neck supple.  No meningeal signs   Cardiovascular: Normal rate, regular rhythm and normal heart sounds.   Pulmonary/Chest: Effort normal and breath sounds normal. No respiratory distress. He has no wheezes. He has no rales.  Abdominal: Soft. Bowel sounds are normal. He exhibits no distension. There is no tenderness. There is no guarding.  Musculoskeletal: Normal range of motion.  Neurological: He is alert and oriented to person, place, and time. No cranial nerve deficit. Coordination normal.  Skin: Skin is warm.  Psychiatric: He has a normal mood and affect.  Nursing note and vitals reviewed.    ED  Treatments / Results  Labs (all labs ordered are listed, but only abnormal results are displayed) Labs Reviewed  CULTURE, BLOOD (ROUTINE X 2)  CULTURE, BLOOD (ROUTINE X 2)  URINE CULTURE  CBC WITH DIFFERENTIAL/PLATELET  COMPREHENSIVE METABOLIC PANEL  URINALYSIS, ROUTINE W REFLEX MICROSCOPIC  LIPASE, BLOOD  I-STAT CG4 LACTIC ACID, ED    EKG  EKG Interpretation None       Radiology Dg Chest 2 View  Result Date: 10/16/2016 CLINICAL DATA:  Patient with new onset fever. EXAM: CHEST  2 VIEW COMPARISON:  Chest radiograph 02/26/2015. FINDINGS: Right anterior chest wall Port-A-Cath is present tip projecting over the superior vena cava. Stable cardiac and mediastinal contours. No consolidative pulmonary opacities. No pleural effusion or pneumothorax. Thoracic spine degenerative changes. IMPRESSION: No acute cardiopulmonary process. Electronically Signed   By: Polly Cobia.D.  On: 10/16/2016 08:16    Procedures Procedures (including critical care time)  CRITICAL CARE Performed by: Wandra Arthurs   Total critical care time: 30 minutes  Critical care time was exclusive of separately billable procedures and treating other patients.  Critical care was necessary to treat or prevent imminent or life-threatening deterioration.  Critical care was time spent personally by me on the following activities: development of treatment plan with patient and/or surrogate as well as nursing, discussions with consultants, evaluation of patient's response to treatment, examination of patient, obtaining history from patient or surrogate, ordering and performing treatments and interventions, ordering and review of laboratory studies, ordering and review of radiographic studies, pulse oximetry and re-evaluation of patient's condition.   Medications Ordered in ED Medications  sodium chloride 0.9 % bolus 1,000 mL (not administered)     Initial Impression / Assessment and Plan / ED Course  I have reviewed  the triage vital signs and the nursing notes.  Pertinent labs & imaging results that were available during my care of the patient were reviewed by me and considered in my medical decision making (see chart for details).     CLAYBORN MILNES is a 66 y.o. male here with fever. Persistent fever since yesterday, patient on chemo. I consulted Dr. Tomasa Blase from ID. She called lab and verified that it was listeria. She felt that it is likely from food sources and recommend IV ampicillin 2 g every 4 hrs and will see patient. Will repeat labs, lactate, cultures. She will report it to health department.   11:43 AM Repeat lactate and WBC nl. Given ancef. Will admit for Listeria bacteremia.    Final Clinical Impressions(s) / ED Diagnoses   Final diagnoses:  None    New Prescriptions New Prescriptions   No medications on file     Drenda Freeze, MD 10/17/16 1144

## 2016-10-17 NOTE — ED Notes (Signed)
Spoke with Dr. Wilson Singer.  Hold on initiating sepsis orders until seen by provider.

## 2016-10-17 NOTE — ED Triage Notes (Signed)
Pt seen yesterday for not feeling well.  Pt is a cancer patient undergoing treatment.  Pt had been having fevers.  Had cultures drawn.  Called this morning that abnormal culture result and to come to ED.

## 2016-10-17 NOTE — Telephone Encounter (Signed)
[name] Rose from lab called with an anaerobic  blood culture result of listeria monocytogenes. Dr. Florina Ou reviewed the chart and would like the pt to be called. If the patient has a fever then he needs to be re-evaluated. Will follow up with pt.

## 2016-10-17 NOTE — Consult Note (Addendum)
[facility] for Infectious Disease  Total days of antibiotics 1        Day 1 ampicillin         Reason for Consult: listeria bacteremia   Referring Physician: yow  Active Problems:   Listeria infection    HPI: [name] Rose is a 66 y.o. 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 He started gemcitabine plus cisplatin on July 2nd. He had portacath placed on 6/28. He was seen in ED on 7/7 for having fevers up to 101. cxr and ua appeared clean and blood cx taken. He was discharged without abtx and to follow up with dr Marin Olp on Monday. Due to blood cx + for listeria, patient was asked to return for IV abtx. 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 He is febrile up to 101.78F WBC at 6.6,  ROS+ fevers up to 102-103F as well as started to have some diarrhea. Denies ha or neck pain  No recent travel, no visits to farmers market, does not recall any unpasteurized cheese. His wife states they have been either eating at home or fastfood. Recently purchased iceberg lettuce from [place] but no other folks are ill  Past Medical History:  Diagnosis Date  . Arthritis   . 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 ([place]) 09/29/2016  . Bladder tumor   . Goals of care, counseling/discussion 09/30/2016  . History of prostate cancer followed by pcp dr [name]-  per pt last PSA undetectable   dx 2008-- (Stage T1c, Gleason 3+3,  PSA 4.58, vol 99cc)  s/p  radical prostatectomy (nerve sparing bilateral)   . Hypertension   . Lower urinary tract symptoms (LUTS)   . Pre-diabetes   . Wears glasses     Allergies: No Known Allergies   MEDICATIONS: . heparin  5,000 Units Subcutaneous Q8H  . potassium chloride  40 mEq Oral Once    Social History  Substance Use Topics  . Smoking status: Former Smoker    Years: 16.00    Types: Cigarettes    Quit  date: 09/25/1984  . Smokeless tobacco: Never Used  . Alcohol use Yes     Comment: occasionally    Family hx: HTN, DM, father died of COPD, mother CHF. No hx of prostate ca  Review of Systems  Constitutional: positive for fever, chills, diaphoresis, activity change, appetite change, fatigue and unexpected weight change.  HENT: Negative for congestion, sore throat, rhinorrhea, sneezing, trouble swallowing and sinus pressure.  Eyes: Negative for photophobia and visual disturbance.  Respiratory: Negative for cough, chest tightness, shortness of breath, wheezing and stridor.  Cardiovascular: Negative for chest pain, palpitations and leg swelling.  Gastrointestinal: positive for diarrhea. Negative for nausea, vomiting, abdominal pain, diarrhea, constipation, blood in stool, abdominal distention and anal bleeding.  Genitourinary: Negative for dysuria, hematuria, flank pain and difficulty urinating.  Musculoskeletal: Negative for myalgias, back pain, joint swelling, arthralgias and gait problem.  Skin: Negative for color change, pallor, rash and wound.  Neurological: Negative for dizziness, tremors, weakness and light-headedness.  Hematological: Negative for adenopathy. Does not bruise/bleed easily.  Psychiatric/Behavioral: Negative for behavioral problems, confusion, sleep disturbance, dysphoric mood, decreased concentration and agitation.     OBJECTIVE: Temp:  [98.8 F (37.1 C)-101.2 F (38.4 C)] 101.2 F (38.4 C) (07/08 1222) Pulse Rate:  [85-110] 110 (07/08 1222) Resp:  [16-20]  18 (07/08 1222) BP: (121-147)/(74-95) 144/95 (07/08 1222) SpO2:  [97 %-98 %] 98 % (07/08 1222) Weight:  [257 lb 8 oz (116.8 kg)] 257 lb 8 oz (116.8 kg) (07/08 1222) Physical Exam  Constitutional: He is oriented to person, place, and time. He appears well-developed and well-nourished. In mild distress.  HENT:  Mouth/Throat: Oropharynx is clear and moist. No oropharyngeal exudate.  Cardiovascular: Normal rate,  regular rhythm and normal heart sounds. Exam reveals no gallop and no friction rub.  No murmur heard.  Pulmonary/Chest: Effort normal and breath sounds normal. No respiratory distress. He has no wheezes.  Chest wall: port a cath nontender Abdominal: Soft. Bowel sounds are normal. He exhibits no distension. There is no tenderness.  Lymphadenopathy:  He has no cervical adenopathy.  Neurological: He is alert and oriented to person, place, and time.  Skin: Skin is warm and dry. No rash noted. No erythema.  Psychiatric: He has a normal mood and affect. His behavior is normal.    LABS: Results for orders placed or performed during the hospital encounter of 10/17/16 (from the past 48 hour(s))  CBC with Differential/Platelet     Status: Abnormal   Collection Time: 10/17/16 10:05 AM  Result Value Ref Range   WBC 6.6 4.0 - 10.5 K/uL   RBC 4.93 4.22 - 5.81 MIL/uL   Hemoglobin 14.3 13.0 - 17.0 g/dL   HCT 39.4 39.0 - 52.0 %   MCV 79.9 78.0 - 100.0 fL   MCH 29.0 26.0 - 34.0 pg   MCHC 36.3 (H) 30.0 - 36.0 g/dL   RDW 12.5 11.5 - 15.5 %   Platelets 100 (L) 150 - 400 K/uL    Comment: SPECIMEN CHECKED FOR CLOTS REPEATED TO VERIFY PLATELET COUNT CONFIRMED BY SMEAR    Neutrophils Relative % 94 %   Neutro Abs 6.2 1.7 - 7.7 K/uL   Lymphocytes Relative 5 %   Lymphs Abs 0.3 (L) 0.7 - 4.0 K/uL   Monocytes Relative 1 %   Monocytes Absolute 0.1 0.1 - 1.0 K/uL   Eosinophils Relative 0 %   Eosinophils Absolute 0.0 0.0 - 0.7 K/uL   Basophils Relative 0 %   Basophils Absolute 0.0 0.0 - 0.1 K/uL  Comprehensive metabolic panel     Status: Abnormal   Collection Time: 10/17/16 10:05 AM  Result Value Ref Range   Sodium 130 (L) 135 - 145 mmol/L   Potassium 3.2 (L) 3.5 - 5.1 mmol/L    Comment: DELTA CHECK NOTED   Chloride 95 (L) 101 - 111 mmol/L   CO2 26 22 - 32 mmol/L   Glucose, Bld 146 (H) 65 - 99 mg/dL   BUN 28 (H) 6 - 20 mg/dL   Creatinine, Ser 1.22 0.61 - 1.24 mg/dL   Calcium 8.1 (L) 8.9 - 10.3  mg/dL   Total Protein 6.3 (L) 6.5 - 8.1 g/dL   Albumin 3.2 (L) 3.5 - 5.0 g/dL   AST 76 (H) 15 - 41 U/L   ALT 74 (H) 17 - 63 U/L   Alkaline Phosphatase 45 38 - 126 U/L   Total Bilirubin 0.8 0.3 - 1.2 mg/dL   GFR calc non Af Amer >60 >60 mL/min   GFR calc Af Amer >60 >60 mL/min    Comment: (NOTE) The eGFR has been calculated using the CKD EPI equation. This calculation has not been validated in all clinical situations. eGFR's persistently <60 mL/min signify possible Chronic Kidney Disease.    Anion gap 9 5 - 15  Lipase, blood     Status: None   Collection Time: 10/17/16 10:05 AM  Result Value Ref Range   Lipase 30 11 - 51 U/L  I-Stat CG4 Lactic Acid, ED     Status: None   Collection Time: 10/17/16 10:13 AM  Result Value Ref Range   Lactic Acid, Venous 1.77 0.5 - 1.9 mmol/L    MICRO: 7/7 blood cx listeria IMAGING: Dg Chest 2 View  Result Date: 10/16/2016 CLINICAL DATA:  Patient with new onset fever. EXAM: CHEST  2 VIEW COMPARISON:  Chest radiograph 02/26/2015. FINDINGS: Right anterior chest wall Port-A-Cath is present tip projecting over the superior vena cava. Stable cardiac and mediastinal contours. No consolidative pulmonary opacities. No pleural effusion or pneumothorax. Thoracic spine degenerative changes. IMPRESSION: No acute cardiopulmonary process. Electronically Signed   By: Lovey Newcomer M.D.   On: 10/16/2016 08:16    Assessment/Plan:  66yo M immunocompromised host due to bladder cancer and receiving chemotherapy who presents with fever and diarrhea and found to have listeria febrile gastroenteritis with bacteremia  - continue with ampicillin 2gm IV Q 4hr. Plan for a minimum of 14days - spoke with lab who will send isolate to [facility] for susceptibilities - continue to monitor for headache but does not appear to have any signs suggestive of meningoencephalitis or cerebritis.  Diarrhea= likely from listeria- GI pathogen panel is pending  Hypokalemia = likely due to  diarrhea. Getting repleted per primary team. If still has significant diarrhea would recommend to check magnesium as well.  Bladder CA = recommend to postpone upcoming chemo cycle starting on tuesday  Dr Linus Salmons to see tomorrow for further recs  Peaches Vanoverbeke B. [place] for Infectious Diseases [phone]

## 2016-10-17 NOTE — ED Notes (Signed)
I have just given report and Verdie Drown is tx him to [facility] now.  He remains in no distress.

## 2016-10-18 ENCOUNTER — Other Ambulatory Visit: Payer: Self-pay | Admitting: *Deleted

## 2016-10-18 ENCOUNTER — Ambulatory Visit: Payer: PPO

## 2016-10-18 DIAGNOSIS — R197 Diarrhea, unspecified: Secondary | ICD-10-CM

## 2016-10-18 DIAGNOSIS — D6959 Other secondary thrombocytopenia: Secondary | ICD-10-CM

## 2016-10-18 DIAGNOSIS — Z5181 Encounter for therapeutic drug level monitoring: Secondary | ICD-10-CM

## 2016-10-18 DIAGNOSIS — C679 Malignant neoplasm of bladder, unspecified: Secondary | ICD-10-CM

## 2016-10-18 DIAGNOSIS — A327 Listerial sepsis: Principal | ICD-10-CM

## 2016-10-18 DIAGNOSIS — R7881 Bacteremia: Secondary | ICD-10-CM

## 2016-10-18 DIAGNOSIS — A329 Listeriosis, unspecified: Secondary | ICD-10-CM

## 2016-10-18 LAB — COMPREHENSIVE METABOLIC PANEL
ALT: 67 U/L — ABNORMAL HIGH (ref 17–63)
AST: 64 U/L — ABNORMAL HIGH (ref 15–41)
Albumin: 2.7 g/dL — ABNORMAL LOW (ref 3.5–5.0)
Alkaline Phosphatase: 42 U/L (ref 38–126)
Anion gap: 8 (ref 5–15)
BUN: 18 mg/dL (ref 6–20)
CO2: 28 mmol/L (ref 22–32)
Calcium: 7.9 mg/dL — ABNORMAL LOW (ref 8.9–10.3)
Chloride: 95 mmol/L — ABNORMAL LOW (ref 101–111)
Creatinine, Ser: 0.9 mg/dL (ref 0.61–1.24)
GFR calc Af Amer: >60 mL/min (ref >60)
GFR calc non Af Amer: >60 mL/min (ref >60)
Glucose, Bld: 115 mg/dL — ABNORMAL HIGH (ref 65–99)
Potassium: 2.8 mmol/L — ABNORMAL LOW (ref 3.5–5.1)
Sodium: 131 mmol/L — ABNORMAL LOW (ref 135–145)
Total Bilirubin: 0.9 mg/dL (ref 0.3–1.2)
Total Protein: 5.7 g/dL — ABNORMAL LOW (ref 6.5–8.1)

## 2016-10-18 LAB — CBC WITH DIFFERENTIAL/PLATELET
Basophils Absolute: 0 10*3/uL (ref 0.0–0.1)
Basophils Relative: 0 %
Eosinophils Absolute: 0 10*3/uL (ref 0.0–0.7)
Eosinophils Relative: 0 %
HCT: 34.6 % — ABNORMAL LOW (ref 39.0–52.0)
Hemoglobin: 11.9 g/dL — ABNORMAL LOW (ref 13.0–17.0)
Lymphocytes Relative: 8 %
Lymphs Abs: 0.5 10*3/uL — ABNORMAL LOW (ref 0.7–4.0)
MCH: 28.3 pg (ref 26.0–34.0)
MCHC: 34.4 g/dL (ref 30.0–36.0)
MCV: 82.4 fL (ref 78.0–100.0)
Monocytes Absolute: 0.2 10*3/uL (ref 0.1–1.0)
Monocytes Relative: 2 %
Neutro Abs: 6.4 10*3/uL (ref 1.7–7.7)
Neutrophils Relative %: 90 %
Platelets: 53 10*3/uL — ABNORMAL LOW (ref 150–400)
RBC: 4.2 MIL/uL — ABNORMAL LOW (ref 4.22–5.81)
RDW: 12.8 % (ref 11.5–15.5)
WBC: 7 10*3/uL (ref 4.0–10.5)

## 2016-10-18 LAB — URINE CULTURE: Culture: NO GROWTH

## 2016-10-18 LAB — GASTROINTESTINAL PANEL BY PCR, STOOL (REPLACES STOOL CULTURE)

## 2016-10-18 LAB — MAGNESIUM: Magnesium: 1.7 mg/dL (ref 1.7–2.4)

## 2016-10-18 LAB — PROTIME-INR
INR: 1.29
Prothrombin Time: 16.2 seconds — ABNORMAL HIGH (ref 11.4–15.2)

## 2016-10-18 LAB — LACTIC ACID, PLASMA: Lactic Acid, Venous: 1.1 mmol/L (ref 0.5–1.9)

## 2016-10-18 LAB — HIV ANTIBODY (ROUTINE TESTING W REFLEX): HIV Screen 4th Generation wRfx: NONREACTIVE

## 2016-10-18 MED ORDER — POTASSIUM CHLORIDE CRYS ER 20 MEQ PO TBCR
40.0000 meq | EXTENDED_RELEASE_TABLET | Freq: Once | ORAL | Status: AC
  Administered 2016-10-18: 40 meq via ORAL

## 2016-10-18 MED ORDER — POTASSIUM CHLORIDE CRYS ER 20 MEQ PO TBCR
40.0000 meq | EXTENDED_RELEASE_TABLET | Freq: Every day | ORAL | Status: DC
  Administered 2016-10-19 – 2016-10-21 (×3): 40 meq via ORAL
  Filled 2016-10-18 (×3): qty 2

## 2016-10-18 NOTE — Consult Note (Signed)
Referral MD  Reason for Referral: Listeria bacteremia; metastatic bladder cancer on chemotherapy   Chief Complaint  Patient presents with  . Abnormal Lab  : I was told that I had a blood infection  HPI: [name] Rose is a very nice 66 year old white male. He has poorly differentiated bladder cancer. This is metastatic. He did have surgery for this. He had a PET scan recently which showed extensive disease.  He started chemotherapy a week ago. He is on cis-platinum/gemcitabine . He tolerated this well so far.  This weekend, beginning have some temperatures. He has some diarrhea.  He went to the emergency room. Blood cultures were taken. Shockingly enough, he was found to have listeria in his blood.  She's having a lot of diarrhea. 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  He now is on ampicillin.  He is not neutropenic. On admission, his white cell count was 5.4. Today, his white count is 7000. His platelet count is down a little bit. I'm sure this is from his chemotherapy.  He is not having any headaches. There's no visual changes. He's having no arthralgias. He's having no cough. He's having no bleeding.  His chemical studies all look pretty good. His creatinine is 0.9.  The diarrhea is pretty profuse. The gastroenteritis panel is pending to make sure nothing else is going on.  Currently, his performance status is ECOG 1.   Past Medical History:  Diagnosis Date  . Arthritis   . 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 ([place]) 09/29/2016  . Bladder tumor   . Goals of care, counseling/discussion 09/30/2016  . History of prostate cancer followed by pcp dr [name]-  per pt last PSA undetectable   dx 2008-- (Stage T1c, Gleason 3+3,  PSA 4.58, vol 99cc)  s/p  radical prostatectomy (nerve sparing bilateral)   . Hypertension   . Lower urinary tract symptoms (LUTS)   . Pre-diabetes   . Wears glasses   :  Past Surgical History:  Procedure  Laterality Date  . CATARACT EXTRACTION W/ INTRAOCULAR LENS  IMPLANT, BILATERAL Bilateral 2011  . [place]  . IR FLUORO GUIDE PORT INSERTION RIGHT  10/07/2016  . IR US GUIDE VASC ACCESS RIGHT  10/07/2016  . KNEE ARTHROSCOPY Bilateral right 2006;  left 02-15-2007  . [place];  1990;  1983  . ROBOT ASSISTED LAPAROSCOPIC RADICAL PROSTATECTOMY  06/20/2006   bilateral nerve sparing  . TOTAL HIP ARTHROPLASTY Left 03/03/2015   Procedure: LEFT TOTAL HIP ARTHROPLASTY ANTERIOR APPROACH;  Surgeon: Dorna Leitz, MD;  Location: [facility];  Service: Orthopedics;  Laterality: Left;  . TOTAL KNEE ARTHROPLASTY Bilateral left 08-13-2009;  right 12-26-2009  . TRANSURETHRAL RESECTION OF BLADDER TUMOR N/A 09/20/2016   Procedure: TRANSURETHRAL RESECTION OF BLADDER TUMOR (TURBT);  Surgeon: Franchot Gallo, MD;  Location: [facility];  Service: Urology;  Laterality: N/A;  :   Current Facility-Administered Medications:  .  0.9 %  sodium chloride infusion, , Intravenous, Continuous, Lavina Hamman, MD, Last Rate: 100 mL/hr at 10/17/16 1700 .  acetaminophen (TYLENOL) tablet 650 mg, 650 mg, Oral, Q6H PRN **OR** acetaminophen (TYLENOL) suppository 650 mg, 650 mg, Rectal, Q6H PRN, Lavina Hamman, MD .  ampicillin (OMNIPEN) 2 g in sodium chloride 0.9 % 50 mL IVPB, 2 g, Intravenous, Q4H, Drenda Freeze, MD, Stopped at 10/18/16 [phone] .  heparin injection 5,000 Units, 5,000 Units, Subcutaneous, Q8H, Lavina Hamman, MD, 5,000 Units at 10/18/16  65 .  HYDROcodone-acetaminophen (NORCO/VICODIN) 5-325 MG per tablet 1 tablet, 1 tablet, Oral, Q6H PRN, Lavina Hamman, MD .  LORazepam (ATIVAN) tablet 0.5 mg, 0.5 mg, Oral, Q6H PRN, Lavina Hamman, MD .  ondansetron [facility]) tablet 4 mg, 4 mg, Oral, Q6H PRN **OR** ondansetron (ZOFRAN) injection 4 mg, 4 mg, Intravenous, Q6H PRN, Lavina Hamman, MD:  . heparin  5,000 Units Subcutaneous Q8H  :  No Known Allergies:  History  reviewed. No pertinent family history.:  Social History   Social History  . Marital status: Married    Spouse name: N/A  . Number of children: N/A  . Years of education: N/A   Occupational History  . Not on file.   Social History Main Topics  . Smoking status: Former Smoker    Years: 16.00    Types: Cigarettes    Quit date: 09/25/1984  . Smokeless tobacco: Never Used  . Alcohol use Yes     Comment: occasionally  . Drug use: No  . Sexual activity: Not on file   Other Topics Concern  . Not on file   Social History Narrative  . No narrative on file  :  Pertinent items are noted in HPI.  Exam: Patient Vitals for the past 24 hrs:  BP Temp Temp src Pulse Resp SpO2 Height Weight  10/18/16 0632 (!) 143/81 99.6 F (37.6 C) Oral 85 18 99 % - -  10/17/16 2044 (!) 179/87 99.1 F (37.3 C) Oral 90 18 100 % - 255 lb 11.7 oz (116 kg)  10/17/16 1222 (!) 144/95 (!) 101.2 F (38.4 C) Oral (!) 110 18 98 % $Re'6\' 4"'eqb$  (1.93 m) 257 lb 8 oz (116.8 kg)  10/17/16 1153 (!) 147/88 99.3 F (37.4 C) Oral (!) 101 16 97 % - -  10/17/16 0913 121/74 98.8 F (37.1 C) Oral 85 20 98 % - -    As above    Recent Labs  10/17/16 1005 10/18/16 0511  WBC 6.6 7.0  HGB 14.3 11.9*  HCT 39.4 34.6*  PLT 100* 53*    Recent Labs  10/17/16 1005 10/18/16 0511  NA 130* 131*  K 3.2* 2.8*  CL 95* 95*  CO2 26 28  GLUCOSE 146* 115*  BUN 28* 18  CREATININE 1.22 0.90  CALCIUM 8.1* 7.9*    Blood smear review:  None  Pathology: None     Assessment and Plan:  [name] Rose is a 66 year old white male. His metastatic bladder cancer. He has first cycle of chemotherapy a week ago. He now has listeria in his blood and having gastroenteritis with diarrhea.  I must say that I cannot remember the last time I saw patient on treatment who developed listeria. This is a very rare occurrence for our cancer patients.  He gives no risk factors for listeriosis. He's had no recent travel. He has not eaten raw food.  He's had no unpasteurized milk. Again he is not neutropenic.  I don't see anything that would suggest listeria CNS involvement.  I know that infectious disease is seen him. I appreciate their help.  His thrombocytopenia is from his chemotherapy. I would just watch this for right now.  We will follow along. There is not much that we really need to do.   Hopefully, he will be able to go home soon. I think the limiting factor will be his diarrhea.  I appreciate everybody's help in taking care of him.  [name], MD  Jeneen Rinks 1:5-7

## 2016-10-18 NOTE — Progress Notes (Signed)
[name] for Infectious Disease   Reason for visit: Follow up on Listeria bacteremia  Interval History: feels he is improved with less diarrhea since this am, last fever yesterday. Repeat blood cultures (before starting antibiotics) again positive.  No headache, no chest pain or sob.    Physical Exam: Constitutional:  Vitals:   10/17/16 2044 10/18/16 0632  BP: (!) 179/87 (!) 143/81  Pulse: 90 85  Resp: 18 18  Temp: 99.1 F (37.3 C) 99.6 F (37.6 C)   patient appears in NAD Respiratory: Normal respiratory effort; CTA B Cardiovascular: RRR GI: soft, nt, nd, hyperactive bs  Review of Systems: Constitutional: negative for chills and malaise Respiratory: negative for cough Integument/breast: negative for rash  Lab Results  Component Value Date   WBC 7.0 10/18/2016   HGB 11.9 (L) 10/18/2016   HCT 34.6 (L) 10/18/2016   MCV 82.4 10/18/2016   PLT 53 (L) 10/18/2016    Lab Results  Component Value Date   CREATININE 0.90 10/18/2016   BUN 18 10/18/2016   NA 131 (L) 10/18/2016   K 2.8 (L) 10/18/2016   CL 95 (L) 10/18/2016   CO2 28 10/18/2016    Lab Results  Component Value Date   ALT 67 (H) 10/18/2016   AST 64 (H) 10/18/2016   ALKPHOS 42 10/18/2016     Microbiology: Recent Results (from the past 240 hour(s))  Blood culture (routine x 2)     Status: None (Preliminary result)   Collection Time: 10/16/16  5:16 AM  Result Value Ref Range Status   Specimen Description BLOOD RIGHT ANTECUBITAL  Final   Special Requests   Final    BOTTLES DRAWN AEROBIC AND ANAEROBIC Blood Culture adequate volume   Culture  Setup Time   Final    IN BOTH AEROBIC AND ANAEROBIC BOTTLES GRAM POSITIVE RODS CRITICAL RESULT CALLED TO, READ BACK BY AND VERIFIED WITH: TO SMAYNORD(RN) BY TCLEVELAND 10/17/2016 AT 6:34AM Performed at [facility], [address] [place], [place] [address]    Culture GRAM POSITIVE RODS  Final   Report Status PENDING  Incomplete  Blood Culture ID Panel  (Reflexed)     Status: Abnormal   Collection Time: 10/16/16  5:16 AM  Result Value Ref Range Status   Enterococcus species NOT DETECTED NOT DETECTED Final   Listeria monocytogenes DETECTED (A) NOT DETECTED Final    Comment: CRITICAL RESULT CALLED TO, READ BACK BY AND VERIFIED WITH: TO  CMAYNORD(RN) BY TCLEVELAND 10/17/2016 AT 6:34AM    Staphylococcus species NOT DETECTED NOT DETECTED Final   Staphylococcus aureus NOT DETECTED NOT DETECTED Final   Streptococcus species NOT DETECTED NOT DETECTED Final   Streptococcus agalactiae NOT DETECTED NOT DETECTED Final   Streptococcus pneumoniae NOT DETECTED NOT DETECTED Final   Streptococcus pyogenes NOT DETECTED NOT DETECTED Final   Acinetobacter baumannii NOT DETECTED NOT DETECTED Final   Enterobacteriaceae species NOT DETECTED NOT DETECTED Final   Enterobacter cloacae complex NOT DETECTED NOT DETECTED Final   Escherichia coli NOT DETECTED NOT DETECTED Final   Klebsiella oxytoca NOT DETECTED NOT DETECTED Final   Klebsiella pneumoniae NOT DETECTED NOT DETECTED Final   Proteus species NOT DETECTED NOT DETECTED Final   Serratia marcescens NOT DETECTED NOT DETECTED Final   Haemophilus influenzae NOT DETECTED NOT DETECTED Final   Neisseria meningitidis NOT DETECTED NOT DETECTED Final   Pseudomonas aeruginosa NOT DETECTED NOT DETECTED Final   Candida albicans NOT DETECTED NOT DETECTED Final   Candida glabrata NOT DETECTED  NOT DETECTED Final   Candida krusei NOT DETECTED NOT DETECTED Final   Candida parapsilosis NOT DETECTED NOT DETECTED Final   Candida tropicalis NOT DETECTED NOT DETECTED Final    Comment: Performed at [facility], [address], [place], [place] [address]  Blood culture (routine x 2)     Status: None (Preliminary result)   Collection Time: 10/16/16  5:18 AM  Result Value Ref Range Status   Specimen Description BLOOD LEFT ANTECUBITAL  Final   Special Requests IN PEDIATRIC BOTTLE Blood Culture adequate volume  Final    Culture  Setup Time GRAM POSITIVE RODS IN PEDIATRIC BOTTLE   Final   Culture   Final    NO GROWTH 2 DAYS Performed at [facility], [place] [address] [place], [place] [address]    Report Status PENDING  Incomplete  Urine culture     Status: None   Collection Time: 10/16/16  6:59 AM  Result Value Ref Range Status   Specimen Description URINE, RANDOM  Final   Special Requests NONE  Final   Culture   Final    NO GROWTH Performed at [facility], [address], [place], [place] [address]    Report Status 10/17/2016 FINAL  Final  Blood culture (routine x 2)     Status: None (Preliminary result)   Collection Time: 10/17/16 10:00 AM  Result Value Ref Range Status   Specimen Description BLOOD LEFT ANTECUBITAL  Final   Special Requests   Final    BOTTLES DRAWN AEROBIC AND ANAEROBIC Blood Culture adequate volume   Culture  Setup Time   Final    GRAM POSITIVE RODS ANAEROBIC BOTTLE ONLY CRITICAL VALUE NOTED.  VALUE IS CONSISTENT WITH PREVIOUSLY REPORTED AND CALLED VALUE. Performed at [facility], [place] [address], [place], [place] [address]    Culture GRAM POSITIVE RODS  Final   Report Status PENDING  Incomplete  Blood culture (routine x 2)     Status: None (Preliminary result)   Collection Time: 10/17/16 10:05 AM  Result Value Ref Range Status   Specimen Description BLOOD PORTA CATH  Final   Special Requests   Final    BOTTLES DRAWN AEROBIC AND ANAEROBIC Blood Culture adequate volume   Culture  Setup Time   Final    GRAM POSITIVE RODS ANAEROBIC BOTTLE ONLY CRITICAL RESULT CALLED TO, READ BACK BY AND VERIFIED WITH: M. RENZ PHARMD, AT 7106 10/18/16 BY Rush Landmark Performed at [facility], [place] [address], [place], [place] [address]    Culture GRAM POSITIVE RODS  Final   Report Status PENDING  Incomplete  C difficile quick scan w PCR reflex     Status: None   Collection Time: 10/17/16  3:30 PM  Result Value Ref Range Status   C Diff antigen NEGATIVE NEGATIVE  Final   C Diff toxin NEGATIVE NEGATIVE Final   C Diff interpretation No C. difficile detected.  Final    Impression/Plan:  1. Listeria bacteremia - will repeat blood cultures tomorrow to assure clearance.  On ampicillin.  No changes for now.  No CNS signs.    2. Medication monitoring - no rash or issues with antibiotic

## 2016-10-18 NOTE — Patient Outreach (Signed)
[name]) Care Management  10/18/2016  [name] Rose July 23, 1950 [id number]   3rd attempt made to call placed to introduce [facility] care management services and perform telephone screening.  No answer, HIPAA compliant voice message left.  Will await call back.  Will send outreach letter and [facility] information packet in mail.  If call back received, will perform screening at that time.  [name], [place], MSN [place] [phone]

## 2016-10-18 NOTE — Progress Notes (Addendum)
Patient ID: [name] Rose, male   DOB: Aug 20, 1950, 66 y.o.   MRN: [id number]  PROGRESS NOTE    [name] Rose  YTK:[id number] DOB: 1951/01/29 DOA: 10/17/2016  PCP: Shirline Frees, MD   Brief Narrative:  66 year old male with history of bladder cancer, recently started chemotherapy and had had PAC placed recently 10/07/2016. Pt presented to ED with fevers for 3 days. He was seen in ED 7/7 and blood cx were obtained and pt was sent home. He was asked to come back as blood cx were positive for listeria.    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) - Management per oncology    Diarrhea - C.diff negative - GI pathogen panel pending     Thrombocytopenia - Due to hep subQ which we stopped - Check CBC in am - No evidence of gross bleeding, no bleeding from mucosal surfaces     Anemia of chronic disease - Due to malignancy - Hgb down to 11.9 (14.3 on admission), likely dilutional - No bleeding - Follow up CBC in am   DVT prophylaxis: SCD's; stop hep subQ due to drop in platelet count Code Status: full code  Family Communication: no family at the bedside Disposition Plan: home once cleared by oncology   Consultants:   Oncology   Procedures:   None  Antimicrobials:   Ampicillin 10/17/2016 -->   Subjective: Still has diarrhea.   Objective: Vitals:   10/17/16 1153 10/17/16 1222 10/17/16 2044 10/18/16 0632  BP: (!) 147/88 (!) 144/95 (!) 179/87 (!) 143/81  Pulse: (!) 101 (!) 110 90 85  Resp: $Remo'16 18 18 18  'tKTEB$ Temp: 99.3 F (37.4 C) (!) 101.2 F (38.4 C) 99.1 F (37.3 C) 99.6 F (37.6 C)  TempSrc: Oral Oral Oral Oral  SpO2: 97% 98% 100% 99%    Weight:  116.8 kg (257 lb 8 oz) 116 kg (255 lb 11.7 oz)   Height:  $Remove'6\' 4"'cNStloP$  (1.93 m)      Intake/Output Summary (Last 24 hours) at 10/18/16 1241 Last data filed at 10/18/16 0837  Gross per 24 hour  Intake          1826.67 ml  Output              600 ml  Net          1226.67 ml   Filed Weights   10/17/16 1222 10/17/16 2044  Weight: 116.8 kg (257 lb 8 oz) 116 kg (255 lb 11.7 oz)    Examination:  General exam: Appears calm and comfortable  Respiratory system: Clear to auscultation. Respiratory effort normal. Cardiovascular system: S1 & S2 heard, RRR. No JVD Gastrointestinal system: Abdomen is nondistended, soft and nontender. No organomegaly or masses felt. Normal bowel sounds heard. Central nervous system: Alert and oriented. No focal neurological deficits. Extremities: Symmetric 5 x 5 power. Skin: No rashes, lesions or ulcers Psychiatry: Judgement and insight appear normal. Mood & affect appropriate.   Data Reviewed: I have personally reviewed following labs and imaging studies  CBC:  Recent Labs Lab 10/16/16 0516 10/17/16 1005 10/18/16 0511  WBC 5.2 6.6 7.0  NEUTROABS 4.7 6.2 6.4  HGB 12.9*  14.3 11.9*  HCT 37.4* 39.4 34.6*  MCV 82.4 79.9 82.4  PLT 151 100* 53*   Basic Metabolic Panel:  Recent Labs Lab 10/16/16 0516 10/17/16 1005 10/18/16 0511  NA 132* 130* 131*  K 3.9 3.2* 2.8*  CL 96* 95* 95*  CO2 $Re'29 26 28  'aWr$ GLUCOSE 98 146* 115*  BUN 31* 28* 18  CREATININE 0.96 1.22 0.90  CALCIUM 8.3* 8.1* 7.9*  MG  --   --  1.7   GFR: Estimated Creatinine Clearance: 114 mL/min (by C-G formula based on SCr of 0.9 mg/dL). Liver Function Tests:  Recent Labs Lab 10/16/16 0516 10/17/16 1005 10/18/16 0511  AST 38 76* 64*  ALT 42 74* 67*  ALKPHOS 47 45 42  BILITOT 0.6 0.8 0.9  PROT 6.1* 6.3* 5.7*  ALBUMIN 3.3* 3.2* 2.7*    Recent Labs Lab 10/17/16 1005  LIPASE 30   No results for input(s): AMMONIA in the last 168 hours. Coagulation Profile:  Recent  Labs Lab 10/18/16 0511  INR 1.29   Cardiac Enzymes: No results for input(s): CKTOTAL, CKMB, CKMBINDEX, TROPONINI in the last 168 hours. BNP (last 3 results) No results for input(s): PROBNP in the last 8760 hours. HbA1C: No results for input(s): HGBA1C in the last 72 hours. CBG:  Recent Labs Lab 10/12/16 0749  GLUCAP 130*   Lipid Profile: No results for input(s): CHOL, HDL, LDLCALC, TRIG, CHOLHDL, LDLDIRECT in the last 72 hours. Thyroid Function Tests: No results for input(s): TSH, T4TOTAL, FREET4, T3FREE, THYROIDAB in the last 72 hours. Anemia Panel: No results for input(s): VITAMINB12, FOLATE, FERRITIN, TIBC, IRON, RETICCTPCT in the last 72 hours. Urine analysis:    Component Value Date/Time   COLORURINE YELLOW 10/17/2016 1420   APPEARANCEUR HAZY (A) 10/17/2016 1420   LABSPEC 1.023 10/17/2016 1420   PHURINE 5.0 10/17/2016 1420   GLUCOSEU 50 (A) 10/17/2016 1420   HGBUR MODERATE (A) 10/17/2016 1420   BILIRUBINUR NEGATIVE 10/17/2016 1420   KETONESUR 20 (A) 10/17/2016 1420   PROTEINUR 100 (A) 10/17/2016 1420   UROBILINOGEN 0.2 01/31/2010 0624   NITRITE NEGATIVE 10/17/2016 1420   LEUKOCYTESUR NEGATIVE 10/17/2016 1420   Sepsis Labs: $RemoveBefo'@LABRCNTIP'qQJglPJbQdl$ (procalcitonin:4,lacticidven:4)   ) Recent Results (from the past 240 hour(s))  Blood culture (routine x 2)     Status: None (Preliminary result)   Collection Time: 10/16/16  5:16 AM  Result Value Ref Range Status   Specimen Description BLOOD RIGHT ANTECUBITAL  Final   Special Requests   Final    BOTTLES DRAWN AEROBIC AND ANAEROBIC Blood Culture adequate volume   Culture  Setup Time   Final    IN BOTH AEROBIC AND ANAEROBIC BOTTLES GRAM POSITIVE RODS CRITICAL RESULT CALLED TO, READ BACK BY AND VERIFIED WITH: TO SMAYNORD(RN) BY TCLEVELAND 10/17/2016 AT 6:34AM Performed at [facility], [place] [address], [place], [place] [address]    Culture GRAM POSITIVE RODS  Final   Report Status PENDING  Incomplete  Blood Culture ID Panel  (Reflexed)     Status: Abnormal   Collection Time: 10/16/16  5:16 AM  Result Value Ref Range Status   Enterococcus species NOT DETECTED NOT DETECTED Final   Listeria monocytogenes DETECTED (A) NOT DETECTED Final    Comment: CRITICAL RESULT CALLED TO, READ BACK BY AND VERIFIED WITH: TO  CMAYNORD(RN) BY TCLEVELAND 10/17/2016 AT 6:34AM    Staphylococcus species NOT DETECTED NOT DETECTED Final   Staphylococcus aureus NOT DETECTED NOT DETECTED Final   Streptococcus species NOT DETECTED NOT DETECTED Final  Streptococcus agalactiae NOT DETECTED NOT DETECTED Final   Streptococcus pneumoniae NOT DETECTED NOT DETECTED Final   Streptococcus pyogenes NOT DETECTED NOT DETECTED Final   Acinetobacter baumannii NOT DETECTED NOT DETECTED Final   Enterobacteriaceae species NOT DETECTED NOT DETECTED Final   Enterobacter cloacae complex NOT DETECTED NOT DETECTED Final   Escherichia coli NOT DETECTED NOT DETECTED Final   Klebsiella oxytoca NOT DETECTED NOT DETECTED Final   Klebsiella pneumoniae NOT DETECTED NOT DETECTED Final   Proteus species NOT DETECTED NOT DETECTED Final   Serratia marcescens NOT DETECTED NOT DETECTED Final   Haemophilus influenzae NOT DETECTED NOT DETECTED Final   Neisseria meningitidis NOT DETECTED NOT DETECTED Final   Pseudomonas aeruginosa NOT DETECTED NOT DETECTED Final   Candida albicans NOT DETECTED NOT DETECTED Final   Candida glabrata NOT DETECTED NOT DETECTED Final   Candida krusei NOT DETECTED NOT DETECTED Final   Candida parapsilosis NOT DETECTED NOT DETECTED Final   Candida tropicalis NOT DETECTED NOT DETECTED Final    Comment: Performed at [facility], [place] [address] [place], [place] [address]  Blood culture (routine x 2)     Status: None (Preliminary result)   Collection Time: 10/16/16  5:18 AM  Result Value Ref Range Status   Specimen Description BLOOD LEFT ANTECUBITAL  Final   Special Requests IN PEDIATRIC BOTTLE Blood Culture adequate volume  Final    Culture  Setup Time GRAM POSITIVE RODS IN PEDIATRIC BOTTLE   Final   Culture   Final    NO GROWTH 2 DAYS Performed at [facility], [place] [address], [place], [place] [address]    Report Status PENDING  Incomplete  Urine culture     Status: None   Collection Time: 10/16/16  6:59 AM  Result Value Ref Range Status   Specimen Description URINE, RANDOM  Final   Special Requests NONE  Final   Culture   Final    NO GROWTH Performed at [facility], [address] [place], [place] [address]    Report Status 10/17/2016 FINAL  Final  Blood culture (routine x 2)     Status: None (Preliminary result)   Collection Time: 10/17/16 10:00 AM  Result Value Ref Range Status   Specimen Description BLOOD LEFT ANTECUBITAL  Final   Special Requests   Final    BOTTLES DRAWN AEROBIC AND ANAEROBIC Blood Culture adequate volume   Culture  Setup Time   Final    GRAM POSITIVE RODS IN BOTH AEROBIC AND ANAEROBIC BOTTLES CRITICAL VALUE NOTED.  VALUE IS CONSISTENT WITH PREVIOUSLY REPORTED AND CALLED VALUE. Performed at [facility], [place] [address] [place], [place] [address]    Culture GRAM POSITIVE RODS  Final   Report Status PENDING  Incomplete  Blood culture (routine x 2)     Status: None (Preliminary result)   Collection Time: 10/17/16 10:05 AM  Result Value Ref Range Status   Specimen Description BLOOD PORTA CATH  Final   Special Requests   Final    BOTTLES DRAWN AEROBIC AND ANAEROBIC Blood Culture adequate volume   Culture  Setup Time   Final    GRAM POSITIVE RODS ANAEROBIC BOTTLE ONLY CRITICAL RESULT CALLED TO, READ BACK BY AND VERIFIED WITH: M. RENZ PHARMD, AT 9622 10/18/16 BY Rush Landmark Performed at [place] [facility], [place] [address] [place], [place] [address]    Culture GRAM POSITIVE RODS  Final   Report Status PENDING  Incomplete  C difficile  quick scan w PCR reflex     Status: None   Collection Time: 10/17/16  3:30 PM  Result Value Ref Range Status   C Diff antigen  NEGATIVE NEGATIVE Final   C Diff toxin NEGATIVE NEGATIVE Final   C Diff interpretation No C. difficile detected.  Final      Radiology Studies: Dg Chest 2 View  Result Date: 10/16/2016 CLINICAL DATA:  Patient with new onset fever. EXAM: CHEST  2 VIEW COMPARISON:  Chest radiograph 02/26/2015. FINDINGS: Right anterior chest wall Port-A-Cath is present tip projecting over the superior vena cava. Stable cardiac and mediastinal contours. No consolidative pulmonary opacities. No pleural effusion or pneumothorax. Thoracic spine degenerative changes. IMPRESSION: No acute cardiopulmonary process. Electronically Signed   By: Lovey Newcomer M.D.   On: 10/16/2016 08:16        Scheduled Meds: . heparin  5,000 Units Subcutaneous Q8H   Continuous Infusions: . sodium chloride 100 mL/hr at 10/17/16 1700  . ampicillin (OMNIPEN) IV 2 g (10/18/16 1120)     LOS: 1 day    Time spent: 25 minutes  Greater than 50% of the time spent on counseling and coordinating the care.   Leisa Lenz, MD [facility] Pager [phone]  If 7PM-7AM, please contact night-coverage [email or web address] Password [email or web address] 10/18/2016, 12:41 PM

## 2016-10-19 ENCOUNTER — Encounter: Payer: Self-pay | Admitting: *Deleted

## 2016-10-19 DIAGNOSIS — Z95828 Presence of other vascular implants and grafts: Secondary | ICD-10-CM

## 2016-10-19 LAB — CBC
HCT: 32.2 % — ABNORMAL LOW (ref 39.0–52.0)
Hemoglobin: 11.4 g/dL — ABNORMAL LOW (ref 13.0–17.0)
MCH: 28 pg (ref 26.0–34.0)
MCHC: 35.4 g/dL (ref 30.0–36.0)
MCV: 79.1 fL (ref 78.0–100.0)
Platelets: 30 10*3/uL — ABNORMAL LOW (ref 150–400)
RBC: 4.07 MIL/uL — ABNORMAL LOW (ref 4.22–5.81)
RDW: 12.3 % (ref 11.5–15.5)
WBC: 4.7 10*3/uL (ref 4.0–10.5)

## 2016-10-19 LAB — DIFFERENTIAL
Basophils Absolute: 0 10*3/uL (ref 0.0–0.1)
Basophils Relative: 0 %
Eosinophils Absolute: 0 10*3/uL (ref 0.0–0.7)
Eosinophils Relative: 0 %
Lymphocytes Relative: 14 %
Lymphs Abs: 0.7 10*3/uL (ref 0.7–4.0)
Monocytes Absolute: 0.3 10*3/uL (ref 0.1–1.0)
Monocytes Relative: 7 %
Neutro Abs: 3.7 10*3/uL (ref 1.7–7.7)
Neutrophils Relative %: 79 %

## 2016-10-19 LAB — HEPATIC FUNCTION PANEL
ALT: 65 U/L — ABNORMAL HIGH (ref 17–63)
AST: 61 U/L — ABNORMAL HIGH (ref 15–41)
Albumin: 2.3 g/dL — ABNORMAL LOW (ref 3.5–5.0)
Alkaline Phosphatase: 39 U/L (ref 38–126)
Bilirubin, Direct: 0.1 mg/dL (ref 0.1–0.5)
Indirect Bilirubin: 0.3 mg/dL (ref 0.3–0.9)
Total Bilirubin: 0.4 mg/dL (ref 0.3–1.2)
Total Protein: 5.2 g/dL — ABNORMAL LOW (ref 6.5–8.1)

## 2016-10-19 LAB — BASIC METABOLIC PANEL
Anion gap: 6 (ref 5–15)
BUN: 14 mg/dL (ref 6–20)
CO2: 29 mmol/L (ref 22–32)
Calcium: 7.5 mg/dL — ABNORMAL LOW (ref 8.9–10.3)
Chloride: 98 mmol/L — ABNORMAL LOW (ref 101–111)
Creatinine, Ser: 0.74 mg/dL (ref 0.61–1.24)
GFR calc Af Amer: >60 mL/min (ref >60)
GFR calc non Af Amer: >60 mL/min (ref >60)
Glucose, Bld: 120 mg/dL — ABNORMAL HIGH (ref 65–99)
Potassium: 2.7 mmol/L — CL (ref 3.5–5.1)
Sodium: 133 mmol/L — ABNORMAL LOW (ref 135–145)

## 2016-10-19 LAB — MAGNESIUM: Magnesium: 1.7 mg/dL (ref 1.7–2.4)

## 2016-10-19 MED ORDER — POTASSIUM CHLORIDE CRYS ER 20 MEQ PO TBCR
40.0000 meq | EXTENDED_RELEASE_TABLET | Freq: Once | ORAL | Status: AC
  Administered 2016-10-19: 40 meq via ORAL
  Filled 2016-10-19: qty 2

## 2016-10-19 NOTE — Progress Notes (Signed)
Patient ID: [name] Rose, male   DOB: 16-May-1950, 66 y.o.   MRN: [id number]  PROGRESS NOTE    [name] Rose  KLK:[id number] DOB: [birth year] DOA: 10/17/2016  PCP: Shirline Frees, MD   Brief Narrative:  66 year old male with history of bladder cancer, recently started chemotherapy and had had PAC placed recently 10/07/2016. Pt presented to ED with fevers for 3 days. He was seen in ED 7/7 and blood cx were obtained and pt was sent home. He was asked to come back as blood cx were positive for listeria.    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  Objective: Vitals:   10/18/16 0632 10/18/16 1539 10/18/16 2212 10/19/16 0530  BP: (!) 143/81 120/81 (!) 156/76 (!) 146/81  Pulse: 85 98 87 83  Resp: $Remo'18 18 18 17  'AIbTJ$ Temp: 99.6 F (37.6 C) 98.7 F (37.1 C) 99.5 F (37.5 C) 98.8 F (37.1 C)  TempSrc: Oral Oral Oral Oral  SpO2: 99% 100% 100% 98%  Weight:    116.2 kg (256 lb 3.2 oz)  Height:        Intake/Output Summary (Last 24 hours) at 10/19/16 1345 Last data filed at 10/19/16 1258  Gross per 24 hour  Intake          3443.33 ml  Output             1225 ml  Net          2218.33 ml   Filed Weights   10/17/16 1222 10/17/16 2044 10/19/16 0530  Weight: 116.8 kg (257 lb 8 oz) 116 kg (255 lb 11.7 oz) 116.2 kg (256 lb 3.2 oz)    Examination:  Physical Exam  Constitutional: Appears well-developed and well-nourished. No distress.   CVS: RRR, S1/S2 (+) Pulmonary: Effort and breath sounds normal Abdominal: Soft. BS +,  no distension, tenderness, rebound or guarding.  Musculoskeletal: Normal range of motion. No edema and no tenderness.  Lymphadenopathy: No lymphadenopathy noted, cervical, inguinal. Neuro: Alert. Normal reflexes, muscle tone coordination. No cranial nerve deficit. Skin: Skin is warm and dry.   Psychiatric: Normal mood and affect. Behavior, judgment, thought content normal.     Data Reviewed: I have personally reviewed following labs and imaging studies  CBC:  Recent Labs Lab 10/16/16 0516 10/17/16 1005 10/18/16 0511 10/19/16 0645  WBC 5.2 6.6 7.0 4.7  NEUTROABS 4.7 6.2 6.4 3.7  HGB 12.9* 14.3 11.9* 11.4*  HCT 37.4* 39.4 34.6* 32.2*  MCV 82.4 79.9 82.4 79.1  PLT 151  100* 53* 30*   Basic Metabolic Panel:  Recent Labs Lab 10/16/16 0516 10/17/16 1005 10/18/16 0511 10/19/16 0645  NA 132* 130* 131* 133*  K 3.9 3.2* 2.8* 2.7*  CL 96* 95* 95* 98*  CO2 $Re'29 26 28 29  'kUN$ GLUCOSE 98 146* 115* 120*  BUN 31* 28* 18 14  CREATININE 0.96 1.22 0.90 0.74  CALCIUM 8.3* 8.1* 7.9* 7.5*  MG  --   --  1.7 1.7   GFR: Estimated Creatinine Clearance: 128.4 mL/min (by C-G formula based on SCr of 0.74 mg/dL). Liver Function Tests:  Recent Labs Lab 10/16/16 0516 10/17/16 1005 10/18/16 0511 10/19/16 0645  AST 38 76* 64* 61*  ALT 42 74* 67* 65*  ALKPHOS 47 45 42 39  BILITOT 0.6 0.8 0.9 0.4  PROT 6.1* 6.3* 5.7* 5.2*  ALBUMIN 3.3* 3.2* 2.7* 2.3*    Recent Labs Lab 10/17/16 1005  LIPASE 30   No results for input(s): AMMONIA in the last 168  hours. Coagulation Profile:  Recent Labs Lab 10/18/16 0511  INR 1.29   Cardiac Enzymes: No results for input(s): CKTOTAL, CKMB, CKMBINDEX, TROPONINI in the last 168 hours. BNP (last 3 results) No results for input(s): PROBNP in the last 8760 hours. HbA1C: No results for input(s): HGBA1C in the last 72 hours. CBG: No results for input(s): GLUCAP in the last 168 hours. Lipid Profile: No results for input(s): CHOL, HDL, LDLCALC, TRIG, CHOLHDL, LDLDIRECT in the last 72 hours. Thyroid Function Tests: No results for input(s): TSH, T4TOTAL, FREET4, T3FREE, THYROIDAB in the last 72 hours. Anemia Panel: No results for input(s): VITAMINB12, FOLATE, FERRITIN, TIBC, IRON, RETICCTPCT in the last 72 hours. Urine analysis:    Component Value Date/Time   COLORURINE YELLOW 10/17/2016 1420   APPEARANCEUR HAZY (A) 10/17/2016 1420   LABSPEC 1.023 10/17/2016 1420   PHURINE 5.0 10/17/2016 1420   GLUCOSEU 50 (A) 10/17/2016 1420   HGBUR MODERATE (A) 10/17/2016 1420   BILIRUBINUR NEGATIVE 10/17/2016 1420   KETONESUR 20 (A) 10/17/2016 1420   PROTEINUR 100 (A) 10/17/2016 1420   UROBILINOGEN 0.2 01/31/2010 0624   NITRITE NEGATIVE 10/17/2016 1420   LEUKOCYTESUR NEGATIVE 10/17/2016 1420   Sepsis Labs: $RemoveBefo'@LABRCNTIP'VTaAbCLMurv$ (procalcitonin:4,lacticidven:4)   ) Recent Results (from the past 240 hour(s))  Blood culture (routine x 2)     Status: Abnormal (Preliminary result)   Collection Time: 10/16/16  5:16 AM  Result Value Ref Range Status   Specimen Description BLOOD RIGHT ANTECUBITAL  Final   Special Requests   Final    BOTTLES DRAWN AEROBIC AND ANAEROBIC Blood Culture adequate volume   Culture  Setup Time   Final    IN BOTH AEROBIC AND ANAEROBIC BOTTLES GRAM POSITIVE RODS CRITICAL RESULT CALLED TO, READ BACK BY AND VERIFIED WITH: TO SMAYNORD(RN) BY TCLEVELAND 10/17/2016 AT 6:34AM    Culture (A)  Final    LISTERIA MONOCYTOGENES Sent to [place] for further susceptibility testing. HEALTH DEPARTMENT  NOTIFIED Performed at [facility], [place] [address] [place],  [address]    Report Status PENDING  Incomplete  Blood Culture ID Panel (Reflexed)     Status: Abnormal   Collection Time: 10/16/16  5:16 AM  Result Value Ref Range Status   Enterococcus species NOT DETECTED NOT DETECTED Final   Listeria monocytogenes DETECTED (A) NOT DETECTED Final    Comment: CRITICAL RESULT CALLED TO, READ BACK BY AND VERIFIED WITH: TO  CMAYNORD(RN) BY TCLEVELAND 10/17/2016 AT 6:34AM    Staphylococcus species NOT DETECTED NOT DETECTED Final  Staphylococcus aureus NOT DETECTED NOT DETECTED Final   Streptococcus species NOT DETECTED NOT DETECTED Final   Streptococcus agalactiae NOT DETECTED NOT DETECTED Final   Streptococcus pneumoniae NOT DETECTED NOT DETECTED Final   Streptococcus pyogenes NOT DETECTED NOT DETECTED Final   Acinetobacter baumannii NOT DETECTED NOT DETECTED Final   Enterobacteriaceae species NOT DETECTED NOT DETECTED Final   Enterobacter cloacae complex NOT DETECTED NOT DETECTED Final   Escherichia coli NOT DETECTED NOT DETECTED Final   Klebsiella oxytoca NOT DETECTED NOT DETECTED Final   Klebsiella pneumoniae NOT DETECTED NOT DETECTED Final   Proteus species NOT DETECTED NOT DETECTED Final   Serratia marcescens NOT DETECTED NOT DETECTED Final   Haemophilus influenzae NOT DETECTED NOT DETECTED Final   Neisseria meningitidis NOT DETECTED NOT DETECTED Final   Pseudomonas aeruginosa NOT DETECTED NOT DETECTED Final   Candida albicans NOT DETECTED NOT DETECTED Final   Candida glabrata NOT DETECTED NOT DETECTED Final   Candida krusei NOT DETECTED NOT DETECTED Final   Candida parapsilosis NOT DETECTED NOT DETECTED Final   Candida tropicalis NOT DETECTED NOT DETECTED Final    Comment: Performed at [facility], [place] [address], [place], [place] [address]  Blood culture (routine x 2)     Status: Abnormal (Preliminary result)   Collection Time: 10/16/16  5:18 AM  Result Value  Ref Range Status   Specimen Description BLOOD LEFT ANTECUBITAL  Final   Special Requests IN PEDIATRIC BOTTLE Blood Culture adequate volume  Final   Culture  Setup Time GRAM POSITIVE RODS IN PEDIATRIC BOTTLE   Final   Culture (A)  Final    LISTERIA MONOCYTOGENES Sent to [place] for further susceptibility testing. HEALTH DEPARTMENT NOTIFIED Performed at [facility], [place] [address] [place], [place] [address]    Report Status PENDING  Incomplete  Urine culture     Status: None   Collection Time: 10/16/16  6:59 AM  Result Value Ref Range Status   Specimen Description URINE, RANDOM  Final   Special Requests NONE  Final   Culture   Final    NO GROWTH Performed at [facility], [address] [place], [place] [address]    Report Status 10/17/2016 FINAL  Final  Blood culture (routine x 2)     Status: Abnormal (Preliminary result)   Collection Time: 10/17/16 10:00 AM  Result Value Ref Range Status   Specimen Description BLOOD LEFT ANTECUBITAL  Final   Special Requests   Final    BOTTLES DRAWN AEROBIC AND ANAEROBIC Blood Culture adequate volume   Culture  Setup Time   Final    GRAM POSITIVE RODS IN BOTH AEROBIC AND ANAEROBIC BOTTLES CRITICAL VALUE NOTED.  VALUE IS CONSISTENT WITH PREVIOUSLY REPORTED AND CALLED VALUE. Performed at [facility], [place] [address] [place], [place] [address]    Culture LISTERIA MONOCYTOGENES (A)  Final   Report Status PENDING  Incomplete  Blood culture (routine x 2)     Status: Abnormal (Preliminary result)   Collection Time: 10/17/16 10:05 AM  Result Value Ref Range Status   Specimen Description BLOOD PORTA CATH  Final   Special Requests   Final    BOTTLES DRAWN AEROBIC AND ANAEROBIC Blood Culture adequate volume   Culture  Setup Time   Final    GRAM POSITIVE RODS IN BOTH AEROBIC AND ANAEROBIC BOTTLES CRITICAL RESULT CALLED TO, READ BACK BY AND VERIFIED WITH: M. RENZ PHARMD, AT 4193 10/18/16 BY Rush Landmark Performed at [facility], [place] [address], [place], [place] [address]    Culture LISTERIA MONOCYTOGENES (A)  Final   Report Status PENDING  Incomplete  Urine culture     Status: None   Collection Time: 10/17/16  2:20 PM  Result Value Ref Range Status   Specimen Description URINE, CLEAN CATCH  Final   Special Requests NONE  Final   Culture   Final    NO GROWTH Performed at [facility], [place] [address] [place], [place] [address]    Report Status 10/18/2016 FINAL  Final  C difficile quick scan w PCR reflex     Status: None   Collection Time: 10/17/16  3:30 PM  Result Value Ref Range Status   C Diff antigen NEGATIVE NEGATIVE Final   C Diff toxin NEGATIVE NEGATIVE Final   C Diff interpretation No C. difficile detected.  Final  Gastrointestinal Panel by PCR , Stool     Status: None   Collection Time: 10/17/16  3:30 PM  Result Value Ref Range Status   Campylobacter species NOT DETECTED NOT DETECTED Final   Plesimonas shigelloides NOT DETECTED NOT DETECTED Final   Salmonella species NOT DETECTED NOT DETECTED Final   Yersinia enterocolitica NOT DETECTED NOT DETECTED Final   Vibrio species NOT DETECTED NOT DETECTED Final   Vibrio cholerae NOT DETECTED NOT DETECTED Final   Enteroaggregative E coli (EAEC) NOT DETECTED NOT DETECTED Final   Enteropathogenic E coli (EPEC) NOT DETECTED NOT DETECTED Final   Enterotoxigenic E coli (ETEC) NOT DETECTED NOT DETECTED Final   Shiga like toxin producing E coli (STEC) NOT DETECTED NOT DETECTED Final   Shigella/Enteroinvasive E coli (EIEC) NOT DETECTED NOT DETECTED Final   Cryptosporidium NOT DETECTED NOT DETECTED Final   Cyclospora cayetanensis NOT DETECTED NOT DETECTED Final   Entamoeba histolytica NOT DETECTED NOT DETECTED Final   Giardia lamblia NOT DETECTED NOT DETECTED Final   Adenovirus F40/41 NOT DETECTED NOT DETECTED Final   Astrovirus NOT DETECTED NOT DETECTED Final   Norovirus GI/GII NOT DETECTED NOT DETECTED Final   Rotavirus A NOT DETECTED NOT  DETECTED Final   Sapovirus (I, II, IV, and V) NOT DETECTED NOT DETECTED Final      Radiology Studies: Dg Chest 2 View  Result Date: 10/16/2016 CLINICAL DATA:  Patient with new onset fever. EXAM: CHEST  2 VIEW COMPARISON:  Chest radiograph 02/26/2015. FINDINGS: Right anterior chest wall Port-A-Cath is present tip projecting over the superior vena cava. Stable cardiac and mediastinal contours. No consolidative pulmonary opacities. No pleural effusion or pneumothorax. Thoracic spine degenerative changes. IMPRESSION: No acute cardiopulmonary process. Electronically Signed   By: Lovey Newcomer M.D.   On: 10/16/2016 08:16        Scheduled Meds: . potassium chloride  40 mEq Oral Daily   Continuous Infusions: . sodium chloride 1,000 mL (10/19/16 0400)  . ampicillin (OMNIPEN) IV Stopped (10/19/16 1206)     LOS: 2 days    Time spent: 25 minutes  Greater than 50% of the time spent on counseling and coordinating the care.   Leisa Lenz, MD [facility] Pager [phone]  If 7PM-7AM, please contact night-coverage [email or web address] Password [email or web address] 10/19/2016, 1:45 PM

## 2016-10-19 NOTE — Progress Notes (Signed)
[name] Rose is doing a little better. He feels the diarrhea is slowing down.  He is on ampicillin. He's doing well with ampicillin.  His potassium was quite low yesterday. It was 2.8. The results are not yet back today.  His platelet count is now down to 30,000. This might be a combination of chemotherapy plus this bacteremia. He is not bleeding. He is afebrile. He is not complaining of any pain. There is no vomiting. He has no cough area and there is no rashes.  On his physical exam, his blood pressure is 146/81. His pulse is 83. His temperature is 98.8. His abdomen is soft. Bowel sounds are somewhat hyperactive. There is no guarding or rebound tenderness. He has no abdominal mass. There is no fluid wave. Lungs are clear. Cardiac exam regular rate and rhythm. Extremities shows no clubbing, cyanosis or edema. Skin exam shows no rashes. Neurological exam is nonfocal.  [name] is slowly improving. He has listeria bacteremia. Thankfully this is just affecting his GI tract and not his CNS. This should resolve in the next day or so. Infectious disease is following very closely.  His thrombocytopenia is to be watched. Again I have to believe this is mostly his chemotherapy but could also be bacteremia.  We will see how things are going tomorrow. Hopefully, once the diarrhea is under good control and his potassium is corrected, he might be able to go home.  As always, the staff on [address] continue to do a tremendous job taking care of him.  [name], MD  Psalms 145:8

## 2016-10-19 NOTE — Progress Notes (Signed)
[facility] for Infectious Disease   Reason for visit: Follow up on Listeria bacteremia  Interval History:no further diarrhea. Repeat blood cultures (before starting antibiotics) again positive with listeria.  No headache, no chest pain or sob.  Repeat blood cultures sent  Physical Exam: Constitutional:  Vitals:   10/19/16 0530 10/19/16 1444  BP: (!) 146/81 (!) 144/89  Pulse: 83 66  Resp: 17 18  Temp: 98.8 F (37.1 C) 98.1 F (36.7 C)   patient appears in NAD Respiratory: Normal respiratory effort; CTA B Cardiovascular: RRR GI: soft, nt, nd  Review of Systems: Constitutional: negative for chills and malaise Respiratory: negative for cough Integument/breast: negative for rash  Lab Results  Component Value Date   WBC 4.7 10/19/2016   HGB 11.4 (L) 10/19/2016   HCT 32.2 (L) 10/19/2016   MCV 79.1 10/19/2016   PLT 30 (L) 10/19/2016    Lab Results  Component Value Date   CREATININE 0.74 10/19/2016   BUN 14 10/19/2016   NA 133 (L) 10/19/2016   K 2.7 (LL) 10/19/2016   CL 98 (L) 10/19/2016   CO2 29 10/19/2016    Lab Results  Component Value Date   ALT 65 (H) 10/19/2016   AST 61 (H) 10/19/2016   ALKPHOS 39 10/19/2016     Microbiology: Recent Results (from the past 240 hour(s))  Blood culture (routine x 2)     Status: Abnormal (Preliminary result)   Collection Time: 10/16/16  5:16 AM  Result Value Ref Range Status   Specimen Description BLOOD RIGHT ANTECUBITAL  Final   Special Requests   Final    BOTTLES DRAWN AEROBIC AND ANAEROBIC Blood Culture adequate volume   Culture  Setup Time   Final    IN BOTH AEROBIC AND ANAEROBIC BOTTLES GRAM POSITIVE RODS CRITICAL RESULT CALLED TO, READ BACK BY AND VERIFIED WITH: TO SMAYNORD(RN) BY TCLEVELAND 10/17/2016 AT 6:34AM    Culture (A)  Final    LISTERIA MONOCYTOGENES Sent to [name] for further susceptibility testing. HEALTH DEPARTMENT NOTIFIED Performed at [facility], [place] [address] [place], [place]  [address]    Report Status PENDING  Incomplete  Blood Culture ID Panel (Reflexed)     Status: Abnormal   Collection Time: 10/16/16  5:16 AM  Result Value Ref Range Status   Enterococcus species NOT DETECTED NOT DETECTED Final   Listeria monocytogenes DETECTED (A) NOT DETECTED Final    Comment: CRITICAL RESULT CALLED TO, READ BACK BY AND VERIFIED WITH: TO  CMAYNORD(RN) BY TCLEVELAND 10/17/2016 AT 6:34AM    Staphylococcus species NOT DETECTED NOT DETECTED Final   Staphylococcus aureus NOT DETECTED NOT DETECTED Final   Streptococcus species NOT DETECTED NOT DETECTED Final   Streptococcus agalactiae NOT DETECTED NOT DETECTED Final   Streptococcus pneumoniae NOT DETECTED NOT DETECTED Final   Streptococcus pyogenes NOT DETECTED NOT DETECTED Final   Acinetobacter baumannii NOT DETECTED NOT DETECTED Final   Enterobacteriaceae species NOT DETECTED NOT DETECTED Final   Enterobacter cloacae complex NOT DETECTED NOT DETECTED Final   Escherichia coli NOT DETECTED NOT DETECTED Final   Klebsiella oxytoca NOT DETECTED NOT DETECTED Final   Klebsiella pneumoniae NOT DETECTED NOT DETECTED Final   Proteus species NOT DETECTED NOT DETECTED Final   Serratia marcescens NOT DETECTED NOT DETECTED Final   Haemophilus influenzae NOT DETECTED NOT DETECTED Final   Neisseria meningitidis NOT DETECTED NOT DETECTED Final   Pseudomonas aeruginosa NOT DETECTED NOT DETECTED Final   Candida albicans NOT DETECTED NOT DETECTED Final  Candida glabrata NOT DETECTED NOT DETECTED Final   Candida krusei NOT DETECTED NOT DETECTED Final   Candida parapsilosis NOT DETECTED NOT DETECTED Final   Candida tropicalis NOT DETECTED NOT DETECTED Final    Comment: Performed at [facility], [place] [address] [place], [place] [address]  Blood culture (routine x 2)     Status: Abnormal (Preliminary result)   Collection Time: 10/16/16  5:18 AM  Result Value Ref Range Status   Specimen Description BLOOD LEFT ANTECUBITAL  Final   Special  Requests IN PEDIATRIC BOTTLE Blood Culture adequate volume  Final   Culture  Setup Time GRAM POSITIVE RODS IN PEDIATRIC BOTTLE   Final   Culture (A)  Final    LISTERIA MONOCYTOGENES Sent to [place] for further susceptibility testing. HEALTH DEPARTMENT NOTIFIED Performed at [facility], [place] [address], [place], [place] [address]    Report Status PENDING  Incomplete  Urine culture     Status: None   Collection Time: 10/16/16  6:59 AM  Result Value Ref Range Status   Specimen Description URINE, RANDOM  Final   Special Requests NONE  Final   Culture   Final    NO GROWTH Performed at [facility], [address] [place], [place] [address]    Report Status 10/17/2016 FINAL  Final  Blood culture (routine x 2)     Status: Abnormal (Preliminary result)   Collection Time: 10/17/16 10:00 AM  Result Value Ref Range Status   Specimen Description BLOOD LEFT ANTECUBITAL  Final   Special Requests   Final    BOTTLES DRAWN AEROBIC AND ANAEROBIC Blood Culture adequate volume   Culture  Setup Time   Final    GRAM POSITIVE RODS IN BOTH AEROBIC AND ANAEROBIC BOTTLES CRITICAL VALUE NOTED.  VALUE IS CONSISTENT WITH PREVIOUSLY REPORTED AND CALLED VALUE. Performed at [facility], [place] [address], [place], [place] [address]    Culture LISTERIA MONOCYTOGENES (A)  Final   Report Status PENDING  Incomplete  Blood culture (routine x 2)     Status: Abnormal (Preliminary result)   Collection Time: 10/17/16 10:05 AM  Result Value Ref Range Status   Specimen Description BLOOD PORTA CATH  Final   Special Requests   Final    BOTTLES DRAWN AEROBIC AND ANAEROBIC Blood Culture adequate volume   Culture  Setup Time   Final    GRAM POSITIVE RODS IN BOTH AEROBIC AND ANAEROBIC BOTTLES CRITICAL RESULT CALLED TO, READ BACK BY AND VERIFIED WITH: M. RENZ PHARMD, AT 2836 10/18/16 BY Rush Landmark Performed at [facility], [place] [address] [place], [place] [address]    Culture LISTERIA  MONOCYTOGENES (A)  Final   Report Status PENDING  Incomplete  Urine culture     Status: None   Collection Time: 10/17/16  2:20 PM  Result Value Ref Range Status   Specimen Description URINE, CLEAN CATCH  Final   Special Requests NONE  Final   Culture   Final    NO GROWTH Performed at [facility], [place] [address] [place], [place] [address]    Report Status 10/18/2016 FINAL  Final  C difficile quick scan w PCR reflex     Status: None   Collection Time: 10/17/16  3:30 PM  Result Value Ref Range Status   C Diff antigen NEGATIVE NEGATIVE Final   C Diff toxin NEGATIVE NEGATIVE Final   C Diff interpretation No C. difficile detected.  Final  Gastrointestinal Panel by PCR , Stool     Status: None   Collection Time: 10/17/16  3:30 PM  Result Value Ref Range Status   Campylobacter species NOT DETECTED NOT DETECTED Final   Plesimonas shigelloides NOT DETECTED NOT DETECTED Final   Salmonella species NOT DETECTED NOT DETECTED Final   Yersinia enterocolitica NOT DETECTED NOT DETECTED Final   Vibrio species NOT DETECTED NOT DETECTED Final   Vibrio cholerae NOT DETECTED NOT DETECTED Final   Enteroaggregative E coli (EAEC) NOT DETECTED NOT DETECTED Final   Enteropathogenic E coli (EPEC) NOT DETECTED NOT DETECTED Final   Enterotoxigenic E coli (ETEC) NOT DETECTED NOT DETECTED Final   Shiga like toxin producing E coli (STEC) NOT DETECTED NOT DETECTED Final   Shigella/Enteroinvasive E coli (EIEC) NOT DETECTED NOT DETECTED Final   Cryptosporidium NOT DETECTED NOT DETECTED Final   Cyclospora cayetanensis NOT DETECTED NOT DETECTED Final   Entamoeba histolytica NOT DETECTED NOT DETECTED Final   Giardia lamblia NOT DETECTED NOT DETECTED Final   Adenovirus F40/41 NOT DETECTED NOT DETECTED Final   Astrovirus NOT DETECTED NOT DETECTED Final   Norovirus GI/GII NOT DETECTED NOT DETECTED Final   Rotavirus A NOT DETECTED NOT DETECTED Final   Sapovirus (I, II, IV, and V) NOT DETECTED NOT DETECTED Final      Impression/Plan:  1. Listeria bacteremia - will repeat blood cultures tomorrow to assure clearance.  On ampicillin.  No changes for now.  No CNS signs.  Has port and no indication to remove that  Can use port for ampicillin and would treat for 14 days through July 21st  2. Medication monitoring - wbc stable

## 2016-10-19 NOTE — Progress Notes (Signed)
CRITICAL VALUE ALERT  Critical Value:  K+ 2.7  Date & Time Notied:  7/10 0740  Provider Notified: Charlies Silvers  Orders Received/Actions taken: give K+ 40 meq po

## 2016-10-20 ENCOUNTER — Inpatient Hospital Stay (HOSPITAL_COMMUNITY): Payer: PPO

## 2016-10-20 DIAGNOSIS — R51 Headache: Secondary | ICD-10-CM

## 2016-10-20 DIAGNOSIS — C772 Secondary and unspecified malignant neoplasm of intra-abdominal lymph nodes: Secondary | ICD-10-CM

## 2016-10-20 LAB — CBC WITH DIFFERENTIAL/PLATELET
Basophils Absolute: 0 10*3/uL (ref 0.0–0.1)
Basophils Relative: 0 %
Eosinophils Absolute: 0 10*3/uL (ref 0.0–0.7)
Eosinophils Relative: 0 %
HCT: 30.1 % — ABNORMAL LOW (ref 39.0–52.0)
Hemoglobin: 10.6 g/dL — ABNORMAL LOW (ref 13.0–17.0)
Lymphocytes Relative: 17 %
Lymphs Abs: 0.8 10*3/uL (ref 0.7–4.0)
MCH: 28.4 pg (ref 26.0–34.0)
MCHC: 35.2 g/dL (ref 30.0–36.0)
MCV: 80.7 fL (ref 78.0–100.0)
Monocytes Absolute: 0.4 10*3/uL (ref 0.1–1.0)
Monocytes Relative: 9 %
Neutro Abs: 3.6 10*3/uL (ref 1.7–7.7)
Neutrophils Relative %: 74 %
Platelets: 28 10*3/uL — CL (ref 150–400)
RBC: 3.73 MIL/uL — ABNORMAL LOW (ref 4.22–5.81)
RDW: 12.5 % (ref 11.5–15.5)
WBC: 4.8 10*3/uL (ref 4.0–10.5)

## 2016-10-20 LAB — COMPREHENSIVE METABOLIC PANEL
ALT: 58 U/L (ref 17–63)
AST: 48 U/L — ABNORMAL HIGH (ref 15–41)
Albumin: 2.4 g/dL — ABNORMAL LOW (ref 3.5–5.0)
Alkaline Phosphatase: 37 U/L — ABNORMAL LOW (ref 38–126)
Anion gap: 8 (ref 5–15)
BUN: 13 mg/dL (ref 6–20)
CO2: 29 mmol/L (ref 22–32)
Calcium: 7.6 mg/dL — ABNORMAL LOW (ref 8.9–10.3)
Chloride: 98 mmol/L — ABNORMAL LOW (ref 101–111)
Creatinine, Ser: 0.72 mg/dL (ref 0.61–1.24)
GFR calc Af Amer: >60 mL/min (ref >60)
GFR calc non Af Amer: >60 mL/min (ref >60)
Glucose, Bld: 124 mg/dL — ABNORMAL HIGH (ref 65–99)
Potassium: 2.7 mmol/L — CL (ref 3.5–5.1)
Sodium: 135 mmol/L (ref 135–145)
Total Bilirubin: 0.6 mg/dL (ref 0.3–1.2)
Total Protein: 5.3 g/dL — ABNORMAL LOW (ref 6.5–8.1)

## 2016-10-20 MED ORDER — POTASSIUM CHLORIDE 10 MEQ/50ML IV SOLN
10.0000 meq | INTRAVENOUS | Status: AC
  Administered 2016-10-20 (×6): 10 meq via INTRAVENOUS
  Filled 2016-10-20 (×6): qty 50

## 2016-10-20 MED ORDER — POTASSIUM CHLORIDE CRYS ER 20 MEQ PO TBCR
40.0000 meq | EXTENDED_RELEASE_TABLET | Freq: Once | ORAL | Status: AC
  Administered 2016-10-20: 40 meq via ORAL
  Filled 2016-10-20: qty 2

## 2016-10-20 MED ORDER — GADOBENATE DIMEGLUMINE 529 MG/ML IV SOLN
20.0000 mL | Freq: Once | INTRAVENOUS | Status: AC | PRN
  Administered 2016-10-20: 20 mL via INTRAVENOUS

## 2016-10-20 NOTE — Progress Notes (Signed)
PHARMACY CONSULT NOTE FOR:  OUTPATIENT  PARENTERAL ANTIBIOTIC THERAPY (OPAT)  Indication: listeria bactermia Regimen: ampicillin 2gm IV q4h End date: 10/30/2016  IV antibiotic discharge orders are pended. To discharging provider:  please sign these orders via discharge navigator,  Select New Orders & click on the button choice - Manage This Unsigned Work.     Thank you for allowing pharmacy to be a part of this patient's care.  [name] Rose 10/20/2016, 6:22 PM

## 2016-10-20 NOTE — Care Management Note (Signed)
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 Past medical history of bladder cancer, recently started on chemotherapy                  Action/Plan: From home with spouse. Independent with ADLs prior to admission.  Expected Discharge Date:                  Expected Discharge Plan:  Home/Self Care  In-House Referral:     Discharge planning Services  CM Consult  Post Acute Care Choice:    Choice offered to:     DME Arranged:    DME Agency:     HH Arranged:    HH Agency:     Status of Service:  In process, will continue to follow  If discussed at Long Length of Stay Meetings, dates discussed:    Additional Comments:  Lynnell Catalan, RN 10/20/2016, 10:37 AM  [phone]

## 2016-10-20 NOTE — Progress Notes (Signed)
Patient ID: [name] Rose, male   DOB: [birth year], 66 y.o.   MRN: [id number]  PROGRESS NOTE    [name] Rose  MEQ:[id number] DOB: 1950-08-25 DOA: 10/17/2016  PCP: Shirline Frees, MD   Brief Narrative:  66 year old male with history of bladder cancer, recently started chemotherapy and had had PAC placed recently 10/07/2016. Pt presented to ED with fevers for 3 days. He was seen in ED 7/7 and blood cx were obtained and pt was sent home. He was asked to come back as blood cx were positive for listeria.    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  Procedures:   None   Antimicrobials:   Ampicillin 7/8 -->   Subjective: Has headache this am.  Objective: Vitals:   10/19/16 0530 10/19/16 1444 10/19/16 2119 10/20/16 0529  BP: (!) 146/81 (!) 144/89 (!) 144/89 130/86  Pulse: 83 66 76 92  Resp: $Remo'17 18 16 19  'YSbSE$ Temp: 98.8 F (37.1 C) 98.1 F (36.7 C) 98.9 F (37.2 C) 99 F (37.2 C)  TempSrc: Oral Oral Oral Oral  SpO2: 98% 100% 100% 99%  Weight: 116.2 kg (256 lb 3.2 oz)     Height:        Intake/Output Summary (Last 24  hours) at 10/20/16 1002 Last data filed at 10/20/16 0938  Gross per 24 hour  Intake          3318.33 ml  Output             1200 ml  Net          2118.33 ml   Filed Weights   10/17/16 1222 10/17/16 2044 10/19/16 0530  Weight: 116.8 kg (257 lb 8 oz) 116 kg (255 lb 11.7 oz) 116.2 kg (256 lb 3.2 oz)    Examination:  General exam: Appears calm and comfortable  Respiratory system: Clear to auscultation. Respiratory effort normal. Cardiovascular system: S1 & S2 heard, Rate controlled  Gastrointestinal system: Abdomen is nondistended, soft and nontender. No organomegaly or masses felt. Normal bowel sounds heard. Central nervous system: Alert and oriented. No focal neurological deficits. Extremities: Symmetric 5 x 5 power. Skin: No rashes, lesions or ulcers Psychiatry: Judgement and insight appear normal. Mood & affect appropriate.   Data Reviewed: I have personally reviewed following labs and imaging studies  CBC:  Recent Labs Lab 10/16/16 0516 10/17/16 1005 10/18/16 0511 10/19/16 0645 10/20/16 0500  WBC 5.2 6.6 7.0 4.7 4.8  NEUTROABS 4.7 6.2 6.4 3.7 3.6  HGB 12.9* 14.3 11.9* 11.4* 10.6*  HCT 37.4* 39.4  34.6* 32.2* 30.1*  MCV 82.4 79.9 82.4 79.1 80.7  PLT 151 100* 53* 30* 28*   Basic Metabolic Panel:  Recent Labs Lab 10/16/16 0516 10/17/16 1005 10/18/16 0511 10/19/16 0645 10/20/16 0500  NA 132* 130* 131* 133* 135  K 3.9 3.2* 2.8* 2.7* 2.7*  CL 96* 95* 95* 98* 98*  CO2 $Re'29 26 28 29 29  'Vwy$ GLUCOSE 98 146* 115* 120* 124*  BUN 31* 28* $Remov'18 14 13  'ZeBEMh$ CREATININE 0.96 1.22 0.90 0.74 0.72  CALCIUM 8.3* 8.1* 7.9* 7.5* 7.6*  MG  --   --  1.7 1.7  --    GFR: Estimated Creatinine Clearance: 128.4 mL/min (by C-G formula based on SCr of 0.72 mg/dL). Liver Function Tests:  Recent Labs Lab 10/16/16 0516 10/17/16 1005 10/18/16 0511 10/19/16 0645 10/20/16 0500  AST 38 76* 64* 61* 48*  ALT 42 74* 67* 65* 58  ALKPHOS 47 45 42 39 37*  BILITOT 0.6 0.8 0.9 0.4 0.6  PROT 6.1* 6.3*  5.7* 5.2* 5.3*  ALBUMIN 3.3* 3.2* 2.7* 2.3* 2.4*    Recent Labs Lab 10/17/16 1005  LIPASE 30   No results for input(s): AMMONIA in the last 168 hours. Coagulation Profile:  Recent Labs Lab 10/18/16 0511  INR 1.29   Cardiac Enzymes: No results for input(s): CKTOTAL, CKMB, CKMBINDEX, TROPONINI in the last 168 hours. BNP (last 3 results) No results for input(s): PROBNP in the last 8760 hours. HbA1C: No results for input(s): HGBA1C in the last 72 hours. CBG: No results for input(s): GLUCAP in the last 168 hours. Lipid Profile: No results for input(s): CHOL, HDL, LDLCALC, TRIG, CHOLHDL, LDLDIRECT in the last 72 hours. Thyroid Function Tests: No results for input(s): TSH, T4TOTAL, FREET4, T3FREE, THYROIDAB in the last 72 hours. Anemia Panel: No results for input(s): VITAMINB12, FOLATE, FERRITIN, TIBC, IRON, RETICCTPCT in the last 72 hours. Urine analysis:    Component Value Date/Time   COLORURINE YELLOW 10/17/2016 1420   APPEARANCEUR HAZY (A) 10/17/2016 1420   LABSPEC 1.023 10/17/2016 1420   PHURINE 5.0 10/17/2016 1420   GLUCOSEU 50 (A) 10/17/2016 1420   HGBUR MODERATE (A) 10/17/2016 1420   BILIRUBINUR NEGATIVE 10/17/2016 1420   KETONESUR 20 (A) 10/17/2016 1420   PROTEINUR 100 (A) 10/17/2016 1420   UROBILINOGEN 0.2 01/31/2010 0624   NITRITE NEGATIVE 10/17/2016 1420   LEUKOCYTESUR NEGATIVE 10/17/2016 1420   Sepsis Labs: $RemoveBefo'@LABRCNTIP'qdzVXnrPRFi$ (procalcitonin:4,lacticidven:4)   ) Recent Results (from the past 240 hour(s))  Blood culture (routine x 2)     Status: Abnormal (Preliminary result)   Collection Time: 10/16/16  5:16 AM  Result Value Ref Range Status   Specimen Description BLOOD RIGHT ANTECUBITAL  Final   Special Requests   Final    BOTTLES DRAWN AEROBIC AND ANAEROBIC Blood Culture adequate volume   Culture  Setup Time   Final    IN BOTH AEROBIC AND ANAEROBIC BOTTLES GRAM POSITIVE RODS CRITICAL RESULT CALLED TO, READ BACK BY AND VERIFIED WITH: TO SMAYNORD(RN) BY  TCLEVELAND 10/17/2016 AT 6:34AM    Culture (A)  Final    LISTERIA MONOCYTOGENES Sent to [place] for further susceptibility testing. HEALTH DEPARTMENT NOTIFIED Performed at [facility], [place] [address], [place], [place] [address]    Report Status PENDING  Incomplete  Blood Culture ID Panel (Reflexed)     Status: Abnormal   Collection Time: 10/16/16  5:16 AM  Result Value Ref Range Status   Enterococcus species NOT DETECTED NOT DETECTED Final   Listeria monocytogenes DETECTED (A) NOT  DETECTED Final    Comment: CRITICAL RESULT CALLED TO, READ BACK BY AND VERIFIED WITH: TO  CMAYNORD(RN) BY TCLEVELAND 10/17/2016 AT 6:34AM    Staphylococcus species NOT DETECTED NOT DETECTED Final   Staphylococcus aureus NOT DETECTED NOT DETECTED Final   Streptococcus species NOT DETECTED NOT DETECTED Final   Streptococcus agalactiae NOT DETECTED NOT DETECTED Final   Streptococcus pneumoniae NOT DETECTED NOT DETECTED Final   Streptococcus pyogenes NOT DETECTED NOT DETECTED Final   Acinetobacter baumannii NOT DETECTED NOT DETECTED Final   Enterobacteriaceae species NOT DETECTED NOT DETECTED Final   Enterobacter cloacae complex NOT DETECTED NOT DETECTED Final   Escherichia coli NOT DETECTED NOT DETECTED Final   Klebsiella oxytoca NOT DETECTED NOT DETECTED Final   Klebsiella pneumoniae NOT DETECTED NOT DETECTED Final   Proteus species NOT DETECTED NOT DETECTED Final   Serratia marcescens NOT DETECTED NOT DETECTED Final   Haemophilus influenzae NOT DETECTED NOT DETECTED Final   Neisseria meningitidis NOT DETECTED NOT DETECTED Final   Pseudomonas aeruginosa NOT DETECTED NOT DETECTED Final   Candida albicans NOT DETECTED NOT DETECTED Final   Candida glabrata NOT DETECTED NOT DETECTED Final   Candida krusei NOT DETECTED NOT DETECTED Final   Candida parapsilosis NOT DETECTED NOT DETECTED Final   Candida tropicalis NOT DETECTED NOT DETECTED Final    Comment: Performed at [facility], [place] [address] [place], [place] [address]  Blood culture (routine x 2)     Status: Abnormal (Preliminary result)   Collection Time: 10/16/16  5:18 AM  Result Value Ref Range Status   Specimen Description BLOOD LEFT ANTECUBITAL  Final   Special Requests IN PEDIATRIC BOTTLE Blood Culture adequate volume  Final   Culture  Setup Time GRAM POSITIVE RODS IN PEDIATRIC BOTTLE   Final   Culture (A)  Final    LISTERIA MONOCYTOGENES Sent to [place] for further susceptibility testing. HEALTH DEPARTMENT NOTIFIED Performed at [facility], [place] [address] [place], [place] [address]    Report Status PENDING  Incomplete  Urine culture     Status: None   Collection Time: 10/16/16  6:59 AM  Result Value Ref Range Status   Specimen Description URINE, RANDOM  Final   Special Requests NONE  Final   Culture   Final    NO GROWTH Performed at [facility], [address], [place], [place] [address]    Report Status 10/17/2016 FINAL  Final  Blood culture (routine x 2)     Status: Abnormal (Preliminary result)   Collection Time: 10/17/16 10:00 AM  Result Value Ref Range Status   Specimen Description BLOOD LEFT ANTECUBITAL  Final   Special Requests   Final    BOTTLES DRAWN AEROBIC AND ANAEROBIC Blood Culture adequate volume   Culture  Setup Time   Final    GRAM POSITIVE RODS IN BOTH AEROBIC AND ANAEROBIC BOTTLES CRITICAL VALUE NOTED.  VALUE IS CONSISTENT WITH PREVIOUSLY REPORTED AND CALLED VALUE.    Culture (A)  Final    LISTERIA MONOCYTOGENES HEALTH DEPARTMENT NOTIFIED Sent to [place] for further susceptibility testing. Performed at [facility], [place] [address], [place], [place] [address]    Report Status PENDING  Incomplete  Blood culture (routine x 2)     Status: Abnormal (Preliminary result)   Collection Time: 10/17/16 10:05 AM  Result Value Ref Range Status   Specimen Description BLOOD PORTA CATH  Final   Special Requests   Final    BOTTLES DRAWN  AEROBIC AND ANAEROBIC Blood Culture  adequate volume   Culture  Setup Time   Final    GRAM POSITIVE RODS IN BOTH AEROBIC AND ANAEROBIC BOTTLES CRITICAL RESULT CALLED TO, READ BACK BY AND VERIFIED WITH: M. RENZ PHARMD, AT 0973 10/18/16 BY D. VANHOOK    Culture (A)  Final    [place] NOTIFIED Sent to [place] for further susceptibility testing. Performed at [facility], [place] [address], [place], [place] [address]    Report Status PENDING  Incomplete  Urine culture     Status: None   Collection Time: 10/17/16  2:20 PM  Result Value Ref Range Status   Specimen Description URINE, CLEAN CATCH  Final   Special Requests NONE  Final   Culture   Final    NO GROWTH Performed at [facility], [address] [place], [place] [address]    Report Status 10/18/2016 FINAL  Final  C difficile quick scan w PCR reflex     Status: None   Collection Time: 10/17/16  3:30 PM  Result Value Ref Range Status   C Diff antigen NEGATIVE NEGATIVE Final   C Diff toxin NEGATIVE NEGATIVE Final   C Diff interpretation No C. difficile detected.  Final  Gastrointestinal Panel by PCR , Stool     Status: None   Collection Time: 10/17/16  3:30 PM  Result Value Ref Range Status   Campylobacter species NOT DETECTED NOT DETECTED Final   Plesimonas shigelloides NOT DETECTED NOT DETECTED Final   Salmonella species NOT DETECTED NOT DETECTED Final   Yersinia enterocolitica NOT DETECTED NOT DETECTED Final   Vibrio species NOT DETECTED NOT DETECTED Final   Vibrio cholerae NOT DETECTED NOT DETECTED Final   Enteroaggregative E coli (EAEC) NOT DETECTED NOT DETECTED Final   Enteropathogenic E coli (EPEC) NOT DETECTED NOT DETECTED Final   Enterotoxigenic E coli (ETEC) NOT DETECTED NOT DETECTED Final   Shiga like toxin producing E coli (STEC) NOT DETECTED NOT DETECTED Final   Shigella/Enteroinvasive E coli (EIEC) NOT DETECTED NOT DETECTED Final   Cryptosporidium NOT DETECTED NOT DETECTED Final   Cyclospora cayetanensis  NOT DETECTED NOT DETECTED Final   Entamoeba histolytica NOT DETECTED NOT DETECTED Final   Giardia lamblia NOT DETECTED NOT DETECTED Final   Adenovirus F40/41 NOT DETECTED NOT DETECTED Final   Astrovirus NOT DETECTED NOT DETECTED Final   Norovirus GI/GII NOT DETECTED NOT DETECTED Final   Rotavirus A NOT DETECTED NOT DETECTED Final   Sapovirus (I, II, IV, and V) NOT DETECTED NOT DETECTED Final      Radiology Studies: No results found.      Scheduled Meds: . potassium chloride  40 mEq Oral Daily   Continuous Infusions: . sodium chloride 100 mL/hr at 10/19/16 1439  . ampicillin (OMNIPEN) IV Stopped (10/20/16 0831)  . potassium chloride 10 mEq (10/20/16 0929)     LOS: 3 days    Time spent: 25 minutes Greater than 50% of the time spent on counseling and coordinating the care.   Leisa Lenz, MD [facility] Pager [phone]  If 7PM-7AM, please contact night-coverage [email or web address] Password [email or web address] 10/20/2016, 10:02 AM

## 2016-10-20 NOTE — Progress Notes (Signed)
ON CALL HOSPITALIST [email or web address] PAGED AT [phone] ABOUT CRITICAL LABS FOR THIS PT.  KCL-2.7 PLT-28

## 2016-10-20 NOTE — Progress Notes (Signed)
[name] Rose is doing better. His potassium is quite low. It is 2.7. I will give him IV potassium.  His platelet count is 28,000. I'm sure this is from his chemotherapy. He is not bleeding. I would watch this.  He is on ampicillin. He is still doing well. It sounds like he will need IV ampicillin for 14 days. We can do this at home. He does have the Port-A-Cath.  He is eating better. He's having no nausea or vomiting.  On his physical exam, this shows stable vital signs. He is afebrile. His blood pressure is 130/86. His temperature is 99 degrees. His abdomen is soft.active. There is no guarding. Lungs are clear. Cardiac exam regular rate and rhythm. Neurological exam is nonfocal.  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 He'll need this for home.  I suspect we probably will have to hold on his chemotherapy until after he completes his course of ampicillin. I don't think this will be a problem.  Hopefully, he will be able to go home tomorrow.  He is complaining of headaches. I think we have to make sure there is nothing going on with brain metastases area and bladder cancer does tend to metastasize to the brain. 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  [name], MD  Psalm 56:3-4

## 2016-10-20 NOTE — Progress Notes (Signed)
[facility] Coordinator will follow pt with ID team to support home IVABX at DC as ordered.  If patient discharges after hours, please call [phone].   [name] Rose 10/20/2016, 10:48 PM

## 2016-10-21 LAB — CBC WITH DIFFERENTIAL/PLATELET
Basophils Absolute: 0 10*3/uL (ref 0.0–0.1)
Basophils Relative: 1 %
Eosinophils Absolute: 0 10*3/uL (ref 0.0–0.7)
Eosinophils Relative: 0 %
HCT: 30 % — ABNORMAL LOW (ref 39.0–52.0)
Hemoglobin: 10.5 g/dL — ABNORMAL LOW (ref 13.0–17.0)
Lymphocytes Relative: 24 %
Lymphs Abs: 1.5 10*3/uL (ref 0.7–4.0)
MCH: 28.7 pg (ref 26.0–34.0)
MCHC: 35 g/dL (ref 30.0–36.0)
MCV: 82 fL (ref 78.0–100.0)
Monocytes Absolute: 0.5 10*3/uL (ref 0.1–1.0)
Monocytes Relative: 9 %
Neutro Abs: 4.1 10*3/uL (ref 1.7–7.7)
Neutrophils Relative %: 66 %
Platelets: 39 10*3/uL — ABNORMAL LOW (ref 150–400)
RBC: 3.66 MIL/uL — ABNORMAL LOW (ref 4.22–5.81)
RDW: 12.7 % (ref 11.5–15.5)
WBC: 6.1 10*3/uL (ref 4.0–10.5)

## 2016-10-21 LAB — COMPREHENSIVE METABOLIC PANEL
ALT: 50 U/L (ref 17–63)
AST: 37 U/L (ref 15–41)
Albumin: 2.5 g/dL — ABNORMAL LOW (ref 3.5–5.0)
Alkaline Phosphatase: 39 U/L (ref 38–126)
Anion gap: 6 (ref 5–15)
BUN: 9 mg/dL (ref 6–20)
CO2: 30 mmol/L (ref 22–32)
Calcium: 7.7 mg/dL — ABNORMAL LOW (ref 8.9–10.3)
Chloride: 99 mmol/L — ABNORMAL LOW (ref 101–111)
Creatinine, Ser: 0.65 mg/dL (ref 0.61–1.24)
GFR calc Af Amer: >60 mL/min (ref >60)
GFR calc non Af Amer: >60 mL/min (ref >60)
Glucose, Bld: 114 mg/dL — ABNORMAL HIGH (ref 65–99)
Potassium: 3.2 mmol/L — ABNORMAL LOW (ref 3.5–5.1)
Sodium: 135 mmol/L (ref 135–145)
Total Bilirubin: 0.4 mg/dL (ref 0.3–1.2)
Total Protein: 5.4 g/dL — ABNORMAL LOW (ref 6.5–8.1)

## 2016-10-21 MED ORDER — POTASSIUM CHLORIDE CRYS ER 20 MEQ PO TBCR: 20.0000 meq | EXTENDED_RELEASE_TABLET | Freq: Every day | ORAL | 0 refills | Status: DC

## 2016-10-21 MED ORDER — AMPICILLIN IV (FOR PTA / DISCHARGE USE ONLY): 2.0000 g | INTRAVENOUS | 0 refills | Status: AC

## 2016-10-21 NOTE — Progress Notes (Signed)
Patient was discharged with iv antibiotics by [place]. All questions answered, patient in good spirits.

## 2016-10-21 NOTE — Discharge Summary (Signed)
Physician Discharge Summary  [name] Rose RPR:[id number] DOB: [birth year] DOA: 10/17/2016  PCP: Shirline Frees, MD  Admit date: 10/17/2016 Discharge date: 10/21/2016  Recommendations for Outpatient Follow-up:  Continue ampicillin through 7/21. Continue potassium supplementation for 5 days.  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 Pt presented to ED with fevers for 3 days. He was seen in ED 7/7 and blood cx were obtained and pt was sent home. He was asked to come back as blood cx were positive for listeria.    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  Procedures:   None    Antimicrobials:   Ampicillin 7/8 --> 10/30/2016    Signed:  Leisa Lenz, MD  [facility] 10/21/2016, 10:19 AM  Pager #: [phone]  Time spent in minutes: more than 30 minutes  Discharge Exam: Vitals:   10/20/16 2140 10/21/16 0536  BP: (!) 142/83 (!) 131/92  Pulse: 81 87  Resp: 14 14  Temp: 98.4 F (36.9 C) 98.5 F (36.9 C)   Vitals:   10/20/16 0529 10/20/16 1339 10/20/16 2140 10/21/16 0536  BP: 130/86 (!) 148/86 (!) 142/83 (!) 131/92  Pulse: 92 (!) 105 81 87  Resp: '19 16 14 14  ' Temp: 99 F (37.2 C) 98.3 F (36.8 C) 98.4 F (36.9 C) 98.5 F (36.9 C)  TempSrc: Oral Oral Oral Oral  SpO2: 99% 100% 100% 99%  Weight:    118 kg (260 lb 3.2 oz)  Height:        General: Pt is alert, follows commands appropriately, not in acute distress Cardiovascular: Regular rate and rhythm, S1/S2 +, no murmurs Respiratory: Clear to auscultation bilaterally, no wheezing, no crackles, no rhonchi Abdominal: Soft, non tender, non distended, bowel sounds +, no guarding Extremities: no edema, no cyanosis, pulses palpable bilaterally DP and PT Neuro: Grossly nonfocal  Discharge Instructions  Discharge Instructions    Call MD for:  persistant nausea and vomiting    Complete by:  As directed    Call MD for:  redness, tenderness, or signs of infection (pain, swelling, redness, odor or green/yellow discharge around incision site)    Complete by:  As directed  Call MD for:  severe uncontrolled pain    Complete by:  As directed    Diet - low sodium heart healthy    Complete by:  As directed    Discharge instructions    Complete by:  As directed    Continue ampicillin through 7/21. Continue potassium supplementation for 5 days.   Home infusion instructions [facility] May follow [place] Dosing Protocol; May administer Cathflo as needed to maintain patency of vascular access device.; Flushing of vascular access device: per [facility] Protocol: 0.9% NaCl pre/post  medica...    Complete by:  As directed    Instructions:  May follow [place] Dosing Protocol   Instructions:  May administer Cathflo as needed to maintain patency of vascular access device.   Instructions:  Flushing of vascular access device: per [facility] Protocol: 0.9% NaCl pre/post medication administration and prn patency; Heparin 100 u/ml, 37m for implanted ports and Heparin 10u/ml, 572mfor all other central venous catheters.   Instructions:  May follow [facility] Anaphylaxis Protocol for First Dose Administration in the home: 0.9% NaCl at 25-50 ml/hr to maintain IV access for protocol meds. Epinephrine 0.3 ml IV/IM PRN and Benadryl 25-50 IV/IM PRN s/s of anaphylaxis.   Instructions:  [facility] Coordinator (RN) to assist per patient IV care needs in the home PRN.   Increase activity slowly    Complete by:  As directed      Allergies as of 10/21/2016   No Known Allergies     Medication List    TAKE these medications   ALEVE 220 MG Caps Generic drug:  Naproxen Sodium Take 220 mg by mouth 2 (two) times daily.   amLODipine 10 MG tablet Commonly known as:  NORVASC Take 10 mg by mouth every morning.   ampicillin IVPB Inject 2 g into the vein every 4 (four) hours. Indication:  Listeria bacteremia Last Day of Therapy:  10/30/2016 Labs - Once weekly:  CBC/D and BMP, Labs - Every other week:  ESR and CRP   bisoprolol-hydrochlorothiazide 10-6.25 MG tablet Commonly known as:  ZIAC Take 1 tablet by mouth every evening.   Cinnamon 500 MG Tabs Take 1 tablet by mouth 2 (two) times daily.   dexamethasone 4 MG tablet Commonly known as:  DECADRON Take 2 tablets by mouth once a day on the day after cisplatin chemotherapy and then take 2 tablets two times a day for 2 days. Take with food.   lidocaine-prilocaine cream Commonly known as:  EMLA Apply to affected area once What changed:  how much to take  how to take this  when to take this  additional instructions   LORazepam  0.5 MG tablet Commonly known as:  ATIVAN Take 1 tablet (0.5 mg total) by mouth every 6 (six) hours as needed (Nausea or vomiting).   losartan 100 MG tablet Commonly known as:  COZAAR Take 100 mg by mouth every morning.   MAGNESIUM PO Take 400 mg by mouth at bedtime. Leg cramps   ondansetron 8 MG tablet Commonly known as:  ZOFRAN Take 1 tablet (8 mg total) by mouth 2 (two) times daily as needed. Start on the third day after cisplatin chemotherapy.   potassium chloride SA 20 MEQ tablet Commonly known as:  K-DUR,KLOR-CON Take 1 tablet (20 mEq total) by mouth daily.   prochlorperazine 10 MG tablet Commonly known as:  COMPAZINE Take 1 tablet (10 mg total) by mouth every 6 (six) hours as needed (Nausea or vomiting).  Home Infusion Instuctions        Start     Ordered   10/21/16 0000  Home infusion instructions [facility] May follow [place] Dosing Protocol; May administer Cathflo as needed to maintain patency of vascular access device.; Flushing of vascular access device: per [facility] Protocol: 0.9% NaCl pre/post medica...    Question Answer Comment  Instructions May follow [place] Dosing Protocol   Instructions May administer Cathflo as needed to maintain patency of vascular access device.   Instructions Flushing of vascular access device: per [facility] Protocol: 0.9% NaCl pre/post medication administration and prn patency; Heparin 100 u/ml, 43m for implanted ports and Heparin 10u/ml, 56mfor all other central venous catheters.   Instructions May follow [facility] Anaphylaxis Protocol for First Dose Administration in the home: 0.9% NaCl at 25-50 ml/hr to maintain IV access for protocol meds. Epinephrine 0.3 ml IV/IM PRN and Benadryl 25-50 IV/IM PRN s/s of anaphylaxis.   Instructions [facility] Infusion Coordinator (RN) to assist per patient IV care needs in the home PRN.      10/21/16 1018     Follow-up Information    HaShirline FreesMD. Schedule an  appointment as soon as possible for a visit.   Specialty:  Family Medicine Contact information: [address] [phone]          The results of significant diagnostics from this hospitalization (including imaging, microbiology, ancillary and laboratory) are listed below for reference.    Significant Diagnostic Studies: Dg Chest 2 View  Result Date: 10/16/2016 CLINICAL DATA:  Patient with new onset fever. EXAM: CHEST  2 VIEW COMPARISON:  Chest radiograph 02/26/2015. FINDINGS: Right anterior chest wall Port-A-Cath is present tip projecting over the superior vena cava. Stable cardiac and mediastinal contours. No consolidative pulmonary opacities. No pleural effusion or pneumothorax. Thoracic spine degenerative changes. IMPRESSION: No acute cardiopulmonary process. Electronically Signed   By: DrLovey Newcomer.D.   On: 10/16/2016 08:16   Mr BrJeri CosoAGontrast  Result Date: 10/20/2016 CLINICAL DATA:  Metastatic bladder cancer, here for staging. New onset headaches. History of hypertension, prostate cancer. EXAM: MRI HEAD WITHOUT AND WITH CONTRAST TECHNIQUE: Multiplanar, multiecho pulse sequences of the brain and surrounding structures were obtained without and with intravenous contrast. CONTRAST:  2072mULTIHANCE GADOBENATE DIMEGLUMINE 529 MG/ML IV SOLN COMPARISON:  None. FINDINGS: INTRACRANIAL CONTENTS: No reduced diffusion to suggest acute ischemia or hypercellular tumor. No susceptibility artifact to suggest hemorrhage. The ventricles and sulci are normal for patient's age. Scattered subcentimeter supratentorial white matter FLAIR T2 hyperintensities compatible with mild chronic small vessel ischemic disease, normal for age. No suspicious parenchymal signal, masses, mass effect. No abnormal intraparenchymal or extra-axial enhancement. No abnormal extra-axial fluid collections. No extra-axial masses. VASCULAR: Normal major intracranial vascular flow voids  present at skull base. SKULL AND UPPER CERVICAL SPINE: No abnormal sellar expansion. No suspicious calvarial bone marrow signal. Craniocervical junction maintained. SINUSES/ORBITS: LEFT maxillary mucosal retention cyst, trace paranasal sinus mucosal thickening without air-fluid levels. Mastoid air cells are well aerated. The included ocular globes and orbital contents are non-suspicious. Status post bilateral ocular lens implants. OTHER:  Asymmetrically Atrophic RIGHT semispinalis capitis muscle. IMPRESSION: Negative MRI of the head with and without contrast for age (no intracranial metastasis). Electronically Signed   By: CouElon AlasD.   On: 10/20/2016 18:57   Nm Pet Image Initial (pi) Skull Base To Thigh  Result Date: 10/12/2016 CLINICAL DATA:  Initial treatment  strategy for bladder cancer. EXAM: NUCLEAR MEDICINE PET SKULL BASE TO THIGH TECHNIQUE: 13.3 mCi F-18 FDG was injected intravenously. Full-ring PET imaging was performed from the skull base to thigh after the radiotracer. CT data was obtained and used for attenuation correction and anatomic localization. FASTING BLOOD GLUCOSE:  Value: 130 mg/dl COMPARISON:  Abdomen and pelvis CT 09/03/2016 FINDINGS: NECK Hypermetabolic supraclavicular lymphadenopathy is seen bilaterally. 19 mm short axis right supraclavicular lymph node demonstrates SUV max = 8.8. CHEST Hypermetabolic lymphadenopathy is identified in the mediastinum. Prevascular 17 mm short axis lymph node demonstrates SUV max = 13. Heart size upper normal. No pericardial effusion. Insert calcium heart right Port-A-Cath tip is positioned at the SVC/ RA junction. Esophagus unremarkable. No suspicious pulmonary nodule or mass on CT images. ABDOMEN/PELVIS Bulky retroperitoneal lymphadenopathy is evident. Index 2.4 cm short axis left para-aortic lymph node is part of a nodal conglomeration with SUV max = 13. Bulky lymphadenopathy is seen along the right pelvic sidewall and right common femoral  region. Index 18 mm short axis right common femoral lymph node demonstrates SUV max = 12. Bladder is nondistended with circumferential bladder wall thickening. Several small low-density liver lesions are evident but show no definite hypermetabolic FDG accumulation. Atherosclerotic calcification noted in the wall of the abdominal aorta. SKELETON No focal hypermetabolic activity to suggest skeletal metastasis. Patient is status post left total hip replacement. Left groin hernia contains only fat. IMPRESSION: 1. Hypermetabolic metastatic lymphadenopathy in the supraclavicular regions bilaterally, mediastinum, abdominal retroperitoneal space, and right pelvic sidewall. Activity in the bladder wall cannot be discriminated from background excreted activity in the urine. 2.  Aortic Atherosclerois (ICD10-170.0) Electronically Signed   By: Misty Stanley M.D.   On: 10/12/2016 09:40   Ir US Guide Vasc Access Right  Result Date: 10/07/2016 INDICATION: 66 year old male with a history of prostate carcinoma EXAM: IMPLANTED PORT A CATH PLACEMENT WITH ULTRASOUND AND FLUOROSCOPIC GUIDANCE MEDICATIONS: 2.0 g Ancef; The antibiotic was administered within an appropriate time interval prior to skin puncture. ANESTHESIA/SEDATION: Moderate (conscious) sedation was employed during this procedure. A total of Versed 2.0 mg and Fentanyl 100 mcg was administered intravenously. Moderate Sedation Time: 19 minutes. The patient's level of consciousness and vital signs were monitored continuously by radiology nursing throughout the procedure under my direct supervision. FLUOROSCOPY TIME:  Zero minutes, 6 seconds (1.9 mGy) COMPLICATIONS: None PROCEDURE: The procedure, risks, benefits, and alternatives were explained to the patient. Questions regarding the procedure were encouraged and answered. The patient understands and consents to the procedure. Ultrasound survey was performed with images stored and sent to PACs. The right neck and chest was  prepped with chlorhexidine, and draped in the usual sterile fashion using maximum barrier technique (cap and mask, sterile gown, sterile gloves, large sterile sheet, hand hygiene and cutaneous antiseptic). Antibiotic prophylaxis was provided with 2.0g Ancef administered IV one hour prior to skin incision. Local anesthesia was attained by infiltration with 1% lidocaine without epinephrine. Ultrasound demonstrated patency of the right internal jugular vein, and this was documented with an image. Under real-time ultrasound guidance, this vein was accessed with a 21 gauge micropuncture needle and image documentation was performed. A small dermatotomy was made at the access site with an 11 scalpel. A 0.018" wire was advanced into the SVC and used to estimate the length of the internal catheter. The access needle exchanged for a 36F micropuncture vascular sheath. The 0.018" wire was then removed and a 0.035" wire advanced into the IVC. An appropriate location for the subcutaneous  reservoir was selected below the clavicle and an incision was made through the skin and underlying soft tissues. The subcutaneous tissues were then dissected using a combination of blunt and sharp surgical technique and a pocket was formed. A single lumen power injectable portacatheter was then tunneled through the subcutaneous tissues from the pocket to the dermatotomy and the port reservoir placed within the subcutaneous pocket. The venous access site was then serially dilated and a peel away vascular sheath placed over the wire. The wire was removed and the port catheter advanced into position under fluoroscopic guidance. The catheter tip is positioned in the cavoatrial junction. This was documented with a spot image. The portacatheter was then tested and found to flush and aspirate well. The port was flushed with saline followed by 100 units/mL heparinized saline. The pocket was then closed in two layers using first subdermal inverted  interrupted absorbable sutures followed by a running subcuticular suture. The epidermis was then sealed with Dermabond. The dermatotomy at the venous access site was also seal with Dermabond. Patient tolerated the procedure well and remained hemodynamically stable throughout. No complications encountered and no significant blood loss encountered IMPRESSION: Status post right IJ port catheter placement. Catheter ready for use. Signed, Dulcy Fanny. Earleen Newport, DO [facility] Radiology Electronically Signed   By: Corrie Mckusick D.O.   On: 10/07/2016 11:42   Ir Fluoro Guide Port Insertion Right  Result Date: 10/07/2016 INDICATION: 66 year old male with a history of prostate carcinoma EXAM: IMPLANTED PORT A CATH PLACEMENT WITH ULTRASOUND AND FLUOROSCOPIC GUIDANCE MEDICATIONS: 2.0 g Ancef; The antibiotic was administered within an appropriate time interval prior to skin puncture. ANESTHESIA/SEDATION: Moderate (conscious) sedation was employed during this procedure. A total of Versed 2.0 mg and Fentanyl 100 mcg was administered intravenously. Moderate Sedation Time: 19 minutes. The patient's level of consciousness and vital signs were monitored continuously by radiology nursing throughout the procedure under my direct supervision. FLUOROSCOPY TIME:  Zero minutes, 6 seconds (1.9 mGy) COMPLICATIONS: None PROCEDURE: The procedure, risks, benefits, and alternatives were explained to the patient. Questions regarding the procedure were encouraged and answered. The patient understands and consents to the procedure. Ultrasound survey was performed with images stored and sent to PACs. The right neck and chest was prepped with chlorhexidine, and draped in the usual sterile fashion using maximum barrier technique (cap and mask, sterile gown, sterile gloves, large sterile sheet, hand hygiene and cutaneous antiseptic). Antibiotic prophylaxis was provided with 2.0g Ancef administered IV one  hour prior to skin incision. Local anesthesia was attained by infiltration with 1% lidocaine without epinephrine. Ultrasound demonstrated patency of the right internal jugular vein, and this was documented with an image. Under real-time ultrasound guidance, this vein was accessed with a 21 gauge micropuncture needle and image documentation was performed. A small dermatotomy was made at the access site with an 11 scalpel. A 0.018" wire was advanced into the SVC and used to estimate the length of the internal catheter. The access needle exchanged for a 21F micropuncture vascular sheath. The 0.018" wire was then removed and a 0.035" wire advanced into the IVC. An appropriate location for the subcutaneous reservoir was selected below the clavicle and an incision was made through the skin and underlying soft tissues. The subcutaneous tissues were then dissected using a combination of blunt and sharp surgical technique and a pocket was formed. A single lumen power injectable portacatheter was then tunneled through the subcutaneous tissues from the pocket to the dermatotomy and the  port reservoir placed within the subcutaneous pocket. The venous access site was then serially dilated and a peel away vascular sheath placed over the wire. The wire was removed and the port catheter advanced into position under fluoroscopic guidance. The catheter tip is positioned in the cavoatrial junction. This was documented with a spot image. The portacatheter was then tested and found to flush and aspirate well. The port was flushed with saline followed by 100 units/mL heparinized saline. The pocket was then closed in two layers using first subdermal inverted interrupted absorbable sutures followed by a running subcuticular suture. The epidermis was then sealed with Dermabond. The dermatotomy at the venous access site was also seal with Dermabond. Patient tolerated the procedure well and remained hemodynamically stable throughout. No  complications encountered and no significant blood loss encountered IMPRESSION: Status post right IJ port catheter placement. Catheter ready for use. Signed, Dulcy Fanny. Earleen Newport, DO [facility] Radiology Electronically Signed   By: Corrie Mckusick D.O.   On: 10/07/2016 11:42    Microbiology: Recent Results (from the past 240 hour(s))  Blood culture (routine x 2)     Status: Abnormal (Preliminary result)   Collection Time: 10/16/16  5:16 AM  Result Value Ref Range Status   Specimen Description BLOOD RIGHT ANTECUBITAL  Final   Special Requests   Final    BOTTLES DRAWN AEROBIC AND ANAEROBIC Blood Culture adequate volume   Culture  Setup Time   Final    IN BOTH AEROBIC AND ANAEROBIC BOTTLES GRAM POSITIVE RODS CRITICAL RESULT CALLED TO, READ BACK BY AND VERIFIED WITH: TO SMAYNORD(RN) BY TCLEVELAND 10/17/2016 AT 6:34AM    Culture (A)  Final    LISTERIA MONOCYTOGENES Sent to [place] for further susceptibility testing. HEALTH DEPARTMENT NOTIFIED Performed at [facility], [place] [address] [place], [place] [address]    Report Status PENDING  Incomplete  Blood Culture ID Panel (Reflexed)     Status: Abnormal   Collection Time: 10/16/16  5:16 AM  Result Value Ref Range Status   Enterococcus species NOT DETECTED NOT DETECTED Final   Listeria monocytogenes DETECTED (A) NOT DETECTED Final    Comment: CRITICAL RESULT CALLED TO, READ BACK BY AND VERIFIED WITH: TO  CMAYNORD(RN) BY TCLEVELAND 10/17/2016 AT 6:34AM    Staphylococcus species NOT DETECTED NOT DETECTED Final   Staphylococcus aureus NOT DETECTED NOT DETECTED Final   Streptococcus species NOT DETECTED NOT DETECTED Final   Streptococcus agalactiae NOT DETECTED NOT DETECTED Final   Streptococcus pneumoniae NOT DETECTED NOT DETECTED Final   Streptococcus pyogenes NOT DETECTED NOT DETECTED Final   Acinetobacter baumannii NOT DETECTED NOT DETECTED Final   Enterobacteriaceae species NOT DETECTED NOT  DETECTED Final   Enterobacter cloacae complex NOT DETECTED NOT DETECTED Final   Escherichia coli NOT DETECTED NOT DETECTED Final   Klebsiella oxytoca NOT DETECTED NOT DETECTED Final   Klebsiella pneumoniae NOT DETECTED NOT DETECTED Final   Proteus species NOT DETECTED NOT DETECTED Final   Serratia marcescens NOT DETECTED NOT DETECTED Final   Haemophilus influenzae NOT DETECTED NOT DETECTED Final   Neisseria meningitidis NOT DETECTED NOT DETECTED Final   Pseudomonas aeruginosa NOT DETECTED NOT DETECTED Final   Candida albicans NOT DETECTED NOT DETECTED Final   Candida glabrata NOT DETECTED NOT DETECTED Final   Candida krusei NOT DETECTED NOT DETECTED Final   Candida parapsilosis NOT DETECTED NOT DETECTED Final   Candida tropicalis NOT DETECTED NOT DETECTED Final    Comment: Performed at [facility], [place] [address] [place], [place] [address]  Blood culture (routine x 2)     Status: Abnormal (Preliminary result)   Collection Time: 10/16/16  5:18 AM  Result Value Ref Range Status   Specimen Description BLOOD LEFT ANTECUBITAL  Final   Special Requests IN PEDIATRIC BOTTLE Blood Culture adequate volume  Final   Culture  Setup Time GRAM POSITIVE RODS IN PEDIATRIC BOTTLE   Final   Culture (A)  Final    LISTERIA MONOCYTOGENES Sent to [place] for further susceptibility testing. HEALTH DEPARTMENT NOTIFIED Performed at [facility], [place] [address], [place], [place] [address]    Report Status PENDING  Incomplete  Urine culture     Status: None   Collection Time: 10/16/16  6:59 AM  Result Value Ref Range Status   Specimen Description URINE, RANDOM  Final   Special Requests NONE  Final   Culture   Final    NO GROWTH Performed at [facility], [address] [place], [place] [address]    Report Status 10/17/2016 FINAL  Final  Blood culture (routine x 2)     Status: Abnormal (Preliminary result)   Collection Time: 10/17/16 10:00 AM  Result Value Ref Range Status    Specimen Description BLOOD LEFT ANTECUBITAL  Final   Special Requests   Final    BOTTLES DRAWN AEROBIC AND ANAEROBIC Blood Culture adequate volume   Culture  Setup Time   Final    GRAM POSITIVE RODS IN BOTH AEROBIC AND ANAEROBIC BOTTLES CRITICAL VALUE NOTED.  VALUE IS CONSISTENT WITH PREVIOUSLY REPORTED AND CALLED VALUE.    Culture (A)  Final    LISTERIA MONOCYTOGENES HEALTH DEPARTMENT NOTIFIED Sent to [place] for further susceptibility testing. Performed at [facility], [place] [address], [place], [place] [address]    Report Status PENDING  Incomplete  Blood culture (routine x 2)     Status: Abnormal (Preliminary result)   Collection Time: 10/17/16 10:05 AM  Result Value Ref Range Status   Specimen Description BLOOD PORTA CATH  Final   Special Requests   Final    BOTTLES DRAWN AEROBIC AND ANAEROBIC Blood Culture adequate volume   Culture  Setup Time   Final    GRAM POSITIVE RODS IN BOTH AEROBIC AND ANAEROBIC BOTTLES CRITICAL RESULT CALLED TO, READ BACK BY AND VERIFIED WITH: M. RENZ PHARMD, AT 2778 10/18/16 BY D. VANHOOK    Culture (A)  Final    [place] NOTIFIED Sent to [place] for further susceptibility testing. Performed at [facility], [place] [address] [place], [place] [address]    Report Status PENDING  Incomplete  Urine culture     Status: None   Collection Time: 10/17/16  2:20 PM  Result Value Ref Range Status   Specimen Description URINE, CLEAN CATCH  Final   Special Requests NONE  Final   Culture   Final    NO GROWTH Performed at [facility], [address], [place], [place] [address]    Report Status 10/18/2016 FINAL  Final  C difficile quick scan w PCR reflex     Status: None   Collection Time: 10/17/16  3:30 PM  Result Value Ref Range Status   C Diff antigen NEGATIVE NEGATIVE Final   C Diff toxin NEGATIVE NEGATIVE Final   C Diff interpretation No C. difficile detected.  Final  Gastrointestinal Panel by PCR ,  Stool     Status: None  Collection Time: 10/17/16  3:30 PM  Result Value Ref Range Status   Campylobacter species NOT DETECTED NOT DETECTED Final   Plesimonas shigelloides NOT DETECTED NOT DETECTED Final   Salmonella species NOT DETECTED NOT DETECTED Final   Yersinia enterocolitica NOT DETECTED NOT DETECTED Final   Vibrio species NOT DETECTED NOT DETECTED Final   Vibrio cholerae NOT DETECTED NOT DETECTED Final   Enteroaggregative E coli (EAEC) NOT DETECTED NOT DETECTED Final   Enteropathogenic E coli (EPEC) NOT DETECTED NOT DETECTED Final   Enterotoxigenic E coli (ETEC) NOT DETECTED NOT DETECTED Final   Shiga like toxin producing E coli (STEC) NOT DETECTED NOT DETECTED Final   Shigella/Enteroinvasive E coli (EIEC) NOT DETECTED NOT DETECTED Final   Cryptosporidium NOT DETECTED NOT DETECTED Final   Cyclospora cayetanensis NOT DETECTED NOT DETECTED Final   Entamoeba histolytica NOT DETECTED NOT DETECTED Final   Giardia lamblia NOT DETECTED NOT DETECTED Final   Adenovirus F40/41 NOT DETECTED NOT DETECTED Final   Astrovirus NOT DETECTED NOT DETECTED Final   Norovirus GI/GII NOT DETECTED NOT DETECTED Final   Rotavirus A NOT DETECTED NOT DETECTED Final   Sapovirus (I, II, IV, and V) NOT DETECTED NOT DETECTED Final  Culture, blood (routine x 2)     Status: None (Preliminary result)   Collection Time: 10/19/16  8:37 AM  Result Value Ref Range Status   Specimen Description BLOOD LEFT ANTECUBITAL  Final   Special Requests   Final    BOTTLES DRAWN AEROBIC ONLY Blood Culture adequate volume   Culture   Final    NO GROWTH 1 DAY Performed at [facility], [address], [place], [place] [address]    Report Status PENDING  Incomplete  Culture, blood (routine x 2)     Status: None (Preliminary result)   Collection Time: 10/19/16  8:37 AM  Result Value Ref Range Status   Specimen Description BLOOD RIGHT ANTECUBITAL  Final   Special Requests   Final    BOTTLES DRAWN AEROBIC ONLY Blood  Culture adequate volume   Culture   Final    NO GROWTH 1 DAY Performed at [facility], [place] [address], [place], [place] [address]    Report Status PENDING  Incomplete     Labs: Basic Metabolic Panel:  Recent Labs Lab 10/17/16 1005 10/18/16 0511 10/19/16 0645 10/20/16 0500 10/21/16 0425  NA 130* 131* 133* 135 135  K 3.2* 2.8* 2.7* 2.7* 3.2*  CL 95* 95* 98* 98* 99*  CO2 '26 28 29 29 30  ' GLUCOSE 146* 115* 120* 124* 114*  BUN 28* '18 14 13 9  ' CREATININE 1.22 0.90 0.74 0.72 0.65  CALCIUM 8.1* 7.9* 7.5* 7.6* 7.7*  MG  --  1.7 1.7  --   --    Liver Function Tests:  Recent Labs Lab 10/17/16 1005 10/18/16 0511 10/19/16 0645 10/20/16 0500 10/21/16 0425  AST 76* 64* 61* 48* 37  ALT 74* 67* 65* 58 50  ALKPHOS 45 42 39 37* 39  BILITOT 0.8 0.9 0.4 0.6 0.4  PROT 6.3* 5.7* 5.2* 5.3* 5.4*  ALBUMIN 3.2* 2.7* 2.3* 2.4* 2.5*    Recent Labs Lab 10/17/16 1005  LIPASE 30   No results for input(s): AMMONIA in the last 168 hours. CBC:  Recent Labs Lab 10/17/16 1005 10/18/16 0511 10/19/16 0645 10/20/16 0500 10/21/16 0425  WBC 6.6 7.0 4.7 4.8 6.1  NEUTROABS 6.2 6.4 3.7 3.6 4.1  HGB 14.3 11.9* 11.4* 10.6* 10.5*  HCT 39.4  34.6* 32.2* 30.1* 30.0*  MCV 79.9 82.4 79.1 80.7 82.0  PLT 100* 53* 30* 28* 39*   Cardiac Enzymes: No results for input(s): CKTOTAL, CKMB, CKMBINDEX, TROPONINI in the last 168 hours. BNP: BNP (last 3 results) No results for input(s): BNP in the last 8760 hours.  ProBNP (last 3 results) No results for input(s): PROBNP in the last 8760 hours.  CBG: No results for input(s): GLUCAP in the last 168 hours.

## 2016-10-21 NOTE — Discharge Instructions (Signed)
Bacteremia °Bacteremia is the presence of bacteria in the blood. When bacteria enter the bloodstream, they can cause a life-threatening reaction called sepsis, which is a medical emergency. Bacteremia can spread to other parts of the body, including the heart, joints, and brain. °What are the causes? °This condition is caused by bacteria that get into the blood. Bacteria can enter the blood: °· From a skin infection or a cut on your skin. °· During an episode of pneumonia. °· From an infection in your stomach or intestine (gastrointestinal infection). °· From an infection in your bladder or urinary system (urinary tract infection). °· During a dental or medical procedure. °· After you brush your teeth so hard that your gums bleed. °· When a bacterial infection in another part of the body spreads to the blood. °· Through a dirty needle. ° °What increases the risk? °This condition is more likely to develop in: °· Children. °· Elderly adults. °· People who have a long-lasting (chronic) disease or medical condition. °· People who have an artificial joint or heart valve. °· People who have heart valve disease. °· People who have a tube, such as a catheter or IV tube, that has been inserted for a medical treatment. °· People who have a weak body defense system (immune system). °· People who use IV drugs. ° °What are the signs or symptoms? °Symptoms of this condition include: °· Fever. °· Chills. °· A racing heart. °· Shortness of breath. °· Dizziness. °· Weakness. °· Confusion. °· Nausea or vomiting. °· Diarrhea. ° °In some cases, there are no symptoms. Bacteremia that has spread to the other parts of the body may cause symptoms in those areas. °How is this diagnosed? °This condition may be diagnosed with a physical exam and tests, such as: °· A complete blood count (CBC). This test looks for signs of infection. °· Blood cultures. These look for bacteria in your blood. °· Tests of any tubes that you may have inserted into  your body, such as an IV tube or urinary catheter. These tests look for a source of infection. °· Urine tests including urine cultures. These look for bacteria in the urine that could be a source of infection. °· Imaging tests, such as an X-ray, CT scan, MRI, or heart ultrasound. These look for a source of infection in other parts of the body, such as the lungs, heart valves, or joints. ° °How is this treated? °This condition may be treated with: °· Antibiotic medicines given through an IV infusion. Depending on the source of infection, antibiotics may be needed for several weeks. At first, an antibiotic may be given to kill most types of blood bacteria. If your test results show that a certain kind of bacteria is causing problems, the antibiotic may be changed to kill only the bacteria that are causing problems. °· Antibiotics taken by mouth. °· IV fluids to support the body as you fight the infection. °· Removing any catheter or device that could be a source of infection. °· Blood pressure and breathing support, if you have sepsis. °· Surgery to control the source or spread of infection, such as: °? Removing an infected implanted device. °? Removing infected tissue or an abscess. ° °This condition is usually treated at a hospital. If you are treated at home, you may need to come back for medicines, blood tests, and evaluation. This is important. °Follow these instructions at home: °· Take over-the-counter and prescription medicines only as told by your health care provider. °·   If you were prescribed an antibiotic, take it as told by your health care provider. Do not stop taking the antibiotic even if you start to feel better. °· Rest until your condition is under control. °· Drink enough fluid to keep your urine clear or pale yellow. °· Do not smoke. If you need help quitting, ask your health care provider. °· Keep all follow-up visits as told by your health care provider. This is important. °How is this  prevented? °· Get the vaccinations that your health care provider recommends. °· Clean and cover any scrapes or cuts. °· Take good care of your skin. This includes regular bathing and moisturizing. °· Wash your hands often. °· Practice good oral hygiene. Brush your teeth two times a day and floss regularly. °Get help right away if: °· You have pain. °· You have a fever. °· You have trouble breathing. °· Your skin becomes blotchy, pale, or clammy. °· You develop confusion, dizziness, or weakness. °· You develop diarrhea. °· You develop any new symptoms after treatment. °Summary °· Bacteremia is the presence of bacteria in the blood. When bacteria enter the bloodstream, they can cause a life- threatening reaction called sepsis. °· Children and elderly adults are at increased risk of bacteremia. Other risk factors include having a long-lasting (chronic) disease or a weak immune system, having an artificial joint or heart valve, having heart valve disease, having tubes that were inserted in the body for medical treatment, or using IV drugs. °· Some symptoms of bacteremia include fever, chills, shortness of breath, confusion, nausea or vomiting, and diarrhea. °· Tests may be done to diagnose a source of infection that led to bacteremia. These tests may include blood tests, urine tests, and imaging tests. °· Bacteremia is usually treated with antibiotics, usually in a hospital. °This information is not intended to replace advice given to you by your health care provider. Make sure you discuss any questions you have with your health care provider. °Document Released: 01/10/2006 Document Revised: 02/24/2016 Document Reviewed: 02/24/2016 °Elsevier Interactive Patient Education © 2018 [employer] ° °

## 2016-10-21 NOTE — Progress Notes (Addendum)
Pt for home with IV abx. Choice offered to pt for home health services and [facility] chosen. [facility] infusion rep alerted of referral.  Marney Doctor RN,BSN,NCM [phone]

## 2016-10-22 DIAGNOSIS — C679 Malignant neoplasm of bladder, unspecified: Secondary | ICD-10-CM | POA: Diagnosis not present

## 2016-10-22 DIAGNOSIS — Z87891 Personal history of nicotine dependence: Secondary | ICD-10-CM | POA: Diagnosis not present

## 2016-10-22 DIAGNOSIS — Z8546 Personal history of malignant neoplasm of prostate: Secondary | ICD-10-CM | POA: Diagnosis not present

## 2016-10-22 DIAGNOSIS — A327 Listerial sepsis: Secondary | ICD-10-CM | POA: Diagnosis not present

## 2016-10-22 DIAGNOSIS — Z792 Long term (current) use of antibiotics: Secondary | ICD-10-CM | POA: Diagnosis not present

## 2016-10-22 DIAGNOSIS — Z452 Encounter for adjustment and management of vascular access device: Secondary | ICD-10-CM | POA: Diagnosis not present

## 2016-10-22 LAB — MISC LABCORP TEST (SEND OUT): Labcorp test code: 182808

## 2016-10-22 LAB — SUSCEPTIBILITY RESULT

## 2016-10-23 LAB — CULTURE, BLOOD (ROUTINE X 2)
Special Requests: ADEQUATE
Special Requests: ADEQUATE
Special Requests: ADEQUATE
Special Requests: ADEQUATE

## 2016-10-24 ENCOUNTER — Telehealth: Payer: Self-pay

## 2016-10-24 DIAGNOSIS — Z87891 Personal history of nicotine dependence: Secondary | ICD-10-CM | POA: Diagnosis not present

## 2016-10-24 DIAGNOSIS — Z8546 Personal history of malignant neoplasm of prostate: Secondary | ICD-10-CM | POA: Diagnosis not present

## 2016-10-24 DIAGNOSIS — A327 Listerial sepsis: Secondary | ICD-10-CM | POA: Diagnosis not present

## 2016-10-24 DIAGNOSIS — Z452 Encounter for adjustment and management of vascular access device: Secondary | ICD-10-CM | POA: Diagnosis not present

## 2016-10-24 DIAGNOSIS — Z792 Long term (current) use of antibiotics: Secondary | ICD-10-CM | POA: Diagnosis not present

## 2016-10-24 DIAGNOSIS — A419 Sepsis, unspecified organism: Secondary | ICD-10-CM | POA: Diagnosis not present

## 2016-10-24 DIAGNOSIS — C679 Malignant neoplasm of bladder, unspecified: Secondary | ICD-10-CM | POA: Diagnosis not present

## 2016-10-24 LAB — CULTURE, BLOOD (ROUTINE X 2)
Culture: NO GROWTH
Culture: NO GROWTH
Special Requests: ADEQUATE
Special Requests: ADEQUATE

## 2016-10-24 NOTE — Telephone Encounter (Signed)
No further treatment needed for [facility] 10/17/16 after D/C from hospital on Ampicillin per Jimmy Footman Pharm D

## 2016-10-27 DIAGNOSIS — Z8546 Personal history of malignant neoplasm of prostate: Secondary | ICD-10-CM | POA: Diagnosis not present

## 2016-10-27 DIAGNOSIS — C679 Malignant neoplasm of bladder, unspecified: Secondary | ICD-10-CM | POA: Diagnosis not present

## 2016-10-27 DIAGNOSIS — Z87891 Personal history of nicotine dependence: Secondary | ICD-10-CM | POA: Diagnosis not present

## 2016-10-27 DIAGNOSIS — Z792 Long term (current) use of antibiotics: Secondary | ICD-10-CM | POA: Diagnosis not present

## 2016-10-27 DIAGNOSIS — Z452 Encounter for adjustment and management of vascular access device: Secondary | ICD-10-CM | POA: Diagnosis not present

## 2016-10-27 DIAGNOSIS — A327 Listerial sepsis: Secondary | ICD-10-CM | POA: Diagnosis not present

## 2016-10-28 ENCOUNTER — Other Ambulatory Visit: Payer: Self-pay | Admitting: Hematology & Oncology

## 2016-10-28 NOTE — ED Provider Notes (Signed)
[facility] DEPT Provider Note   CSN: [id number] Arrival date & time: 10/16/16  0351     History   Chief Complaint Chief Complaint  Patient presents with  . Fever    HPI KEMO SPRUCE is a 66 y.o. male.  HPI  66 y.o. male with past medical history of bladder cancer, recently started on chemotherapy via Port-A-Cath, hypertension comes in with cc of fevers. Pt's fevers started 3 days ago, tmax is 101. Pt has associated fatigue and feeling cold. Pt denies any rigors, sweats. Pt denies nausea, emesis, cough, chest pains, shortness of breath, headaches, abdominal pain, uti like symptoms or rashes.   Past Medical History:  Diagnosis Date  . Arthritis   . 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 Bladder tumor   . Goals of care, counseling/discussion 09/30/2016  . History of prostate cancer followed by pcp dr [name]-  per pt last PSA undetectable   dx 2008-- (Stage T1c, Gleason 3+3,  PSA 4.58, vol 99cc)  s/p  radical prostatectomy (nerve sparing bilateral)   . Hypertension   . Lower urinary tract symptoms (LUTS)   . Pre-diabetes   . Wears glasses     Patient Active Problem List   Diagnosis Date Noted  . 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 Anorexia 10/17/2016  . Diarrhea 10/17/2016  . LFT elevation 10/17/2016  . Dehydration 10/17/2016  . Fatigue 10/17/2016  . Hypocalcemia 10/17/2016  . Goals of care, counseling/discussion 09/30/2016  . 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 ([place]) 09/29/2016  . Cancer of dome of urinary bladder ([place]) 09/20/2016  . Postoperative anemia due to acute blood loss 03/04/2015  . Hypokalemia 03/04/2015  . 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 CATARACT EXTRACTION W/ INTRAOCULAR LENS  IMPLANT, BILATERAL Bilateral 2011  . [place]  . IR FLUORO GUIDE  PORT INSERTION RIGHT  10/07/2016  . IR US GUIDE VASC ACCESS RIGHT  10/07/2016  . KNEE ARTHROSCOPY Bilateral right 2006;  left 02-15-2007  . [place];  1990;  1983  . ROBOT ASSISTED LAPAROSCOPIC RADICAL PROSTATECTOMY  06/20/2006   bilateral nerve sparing  . TOTAL HIP ARTHROPLASTY Left 03/03/2015   Procedure: LEFT TOTAL HIP ARTHROPLASTY ANTERIOR APPROACH;  Surgeon: Dorna Leitz, MD;  Location: [facility];  Service: Orthopedics;  Laterality: Left;  . TOTAL KNEE ARTHROPLASTY Bilateral left 08-13-2009;  right 12-26-2009  . TRANSURETHRAL RESECTION OF BLADDER TUMOR N/A 09/20/2016   Procedure: TRANSURETHRAL RESECTION OF BLADDER TUMOR (TURBT);  Surgeon: Franchot Gallo, MD;  Location: [facility];  Service: Urology;  Laterality: N/A;       Home Medications    Prior to Admission medications   Medication Sig Start Date End Date Taking? Authorizing Provider  amLODipine (NORVASC) 10 MG tablet Take 10 mg by mouth every morning.   Yes [provider]  bisoprolol-hydrochlorothiazide (ZIAC) 10-6.25 MG tablet Take 1 tablet by mouth every evening.    Yes [provider]  Cinnamon 500 MG TABS Take 1 tablet by mouth 2 (two) times daily.    Yes [provider]  dexamethasone (DECADRON) 4 MG tablet Take 2 tablets by mouth once a day on the day after cisplatin chemotherapy and then take 2 tablets two times a day for 2 days. Take with food. 10/06/16  Yes Volanda Napoleon, MD  lidocaine-prilocaine (EMLA) cream  Apply to affected area once Patient taking differently: Apply 1 application topically once. Port 10/11/16  Yes Volanda Napoleon, MD  LORazepam (ATIVAN) 0.5 MG tablet Take 1 tablet (0.5 mg total) by mouth every 6 (six) hours as needed (Nausea or vomiting). 10/06/16  Yes Volanda Napoleon, MD  losartan (COZAAR) 100 MG tablet Take 100 mg by mouth every morning.    Yes [provider]  MAGNESIUM PO Take 400 mg by mouth at bedtime. Leg cramps   Yes  [provider]  Naproxen Sodium (ALEVE) 220 MG CAPS Take 220 mg by mouth 2 (two) times daily.    Yes [provider]  ondansetron (ZOFRAN) 8 MG tablet Take 1 tablet (8 mg total) by mouth 2 (two) times daily as needed. Start on the third day after cisplatin chemotherapy. 10/06/16  Yes Volanda Napoleon, MD  prochlorperazine (COMPAZINE) 10 MG tablet Take 1 tablet (10 mg total) by mouth every 6 (six) hours as needed (Nausea or vomiting). 10/06/16  Yes Ennever, Rudell Cobb, MD  ampicillin IVPB Inject 2 g into the vein every 4 (four) hours. Indication:  Listeria bacteremia Last Day of Therapy:  10/30/2016 Labs - Once weekly:  CBC/D and BMP, Labs - Every other week:  ESR and CRP 10/21/16 10/31/16  Robbie Lis, MD  potassium chloride SA (K-DUR,KLOR-CON) 20 MEQ tablet Take 1 tablet (20 mEq total) by mouth daily. 10/21/16 10/26/16  Robbie Lis, MD    Family History No family history on file.  Social History Social History  Substance Use Topics  . Smoking status: Former Smoker    Years: 16.00    Types: Cigarettes    Quit date: 09/25/1984  . Smokeless tobacco: Never Used  . Alcohol use Yes     Comment: occasionally     Allergies   Patient has no known allergies.   Review of Systems Review of Systems  Constitutional: Positive for fatigue and fever.  Allergic/Immunologic: Positive for immunocompromised state.  All other systems reviewed and are negative.    Physical Exam Updated Vital Signs BP (!) 159/81   Pulse 76   Temp 98.8 F (37.1 C) (Oral)   Resp 18   Ht _0  (1.88 m)   Wt 120.2 kg (265 lb)   SpO2 99%   BMI 34.02 kg/m   Physical Exam  Constitutional: He is oriented to person, place, and time. He appears well-developed.  HENT:  Head: Normocephalic and atraumatic.  Eyes: Pupils are equal, round, and reactive to light. Conjunctivae and EOM are normal.  Neck: Normal range of motion. Neck supple.  Cardiovascular: Normal rate and regular rhythm.     Pulmonary/Chest: Effort normal and breath sounds normal.  Abdominal: Soft. Bowel sounds are normal. He exhibits no distension and no mass. There is no tenderness. There is no rebound and no guarding.  Musculoskeletal: He exhibits no deformity.  Neurological: He is alert and oriented to person, place, and time.  Skin: Skin is warm.  Nursing note and vitals reviewed.    ED Treatments / Results  Labs (all labs ordered are listed, but only abnormal results are displayed) Labs Reviewed  CULTURE, BLOOD (ROUTINE X 2) - Abnormal; Notable for the following:       Result Value   Culture   (*)    Value: [place] NOTIFIED SEE SEPARATE REPORT FOR SUSCEPTIBILITY RESULTS Performed at [employer] Performed at [facility], [place] [address] [place], [place] [address]  All other components within normal limits  CULTURE, BLOOD (ROUTINE X 2) - Abnormal; Notable for the following:    Culture   (*)    Value: [place] NOTIFIED Performed at [facility], [place] [address], [place], [place] [address]    All other components within normal limits  BLOOD CULTURE ID PANEL (REFLEXED) - Abnormal; Notable for the following:    Listeria monocytogenes DETECTED (*)    All other components within normal limits  CBC WITH DIFFERENTIAL/PLATELET - Abnormal; Notable for the following:    Hemoglobin 12.9 (*)    HCT 37.4 (*)    Lymphs Abs 0.4 (*)    Monocytes Absolute 0.0 (*)    All other components within normal limits  COMPREHENSIVE METABOLIC PANEL - Abnormal; Notable for the following:    Sodium 132 (*)    Chloride 96 (*)    BUN 31 (*)    Calcium 8.3 (*)    Total Protein 6.1 (*)    Albumin 3.3 (*)    All other components within normal limits  URINE CULTURE  URINALYSIS, ROUTINE W REFLEX MICROSCOPIC  I-STAT CG4 LACTIC ACID, ED    EKG  EKG Interpretation None       Radiology No results found.  Procedures Procedures  (including critical care time)  Medications Ordered in ED Medications  sodium chloride 0.9 % bolus 1,000 mL (0 mLs Intravenous Stopped 10/16/16 0659)     Initial Impression / Assessment and Plan / ED Course  I have reviewed the triage vital signs and the nursing notes.  Pertinent labs & imaging results that were available during my care of the patient were reviewed by me and considered in my medical decision making (see chart for details).     Pt comes in with cc of fevers. PT is immunosuppressed. He had a recent chemo session - 1st one. Pt is noted to have fever, tachycardia. Sepsis pathway initiated. History is unclear for particular source of infection. Port site looks clean. Pt has no neutropenia. Plan is to get lactic and blood cultures and CXR, UA. D/C to Dr. Darl Householder to f/u. No antibiotics for now.  Final Clinical Impressions(s) / ED Diagnoses   Final diagnoses:  Febrile illness  Malignant neoplasm of urinary bladder, unspecified site [facility])    New Prescriptions Discharge Medication List as of 10/16/2016  8:37 AM       Varney Biles, MD 10/28/16 1200

## 2016-10-29 DIAGNOSIS — Z1389 Encounter for screening for other disorder: Secondary | ICD-10-CM | POA: Diagnosis not present

## 2016-10-29 DIAGNOSIS — A419 Sepsis, unspecified organism: Secondary | ICD-10-CM | POA: Diagnosis not present

## 2016-10-29 DIAGNOSIS — D494 Neoplasm of unspecified behavior of bladder: Secondary | ICD-10-CM | POA: Diagnosis not present

## 2016-10-29 DIAGNOSIS — I10 Essential (primary) hypertension: Secondary | ICD-10-CM | POA: Diagnosis not present

## 2016-10-29 DIAGNOSIS — R21 Rash and other nonspecific skin eruption: Secondary | ICD-10-CM | POA: Diagnosis not present

## 2016-10-30 DIAGNOSIS — Z8546 Personal history of malignant neoplasm of prostate: Secondary | ICD-10-CM | POA: Diagnosis not present

## 2016-10-30 DIAGNOSIS — Z452 Encounter for adjustment and management of vascular access device: Secondary | ICD-10-CM | POA: Diagnosis not present

## 2016-10-30 DIAGNOSIS — A327 Listerial sepsis: Secondary | ICD-10-CM | POA: Diagnosis not present

## 2016-10-30 DIAGNOSIS — Z87891 Personal history of nicotine dependence: Secondary | ICD-10-CM | POA: Diagnosis not present

## 2016-10-30 DIAGNOSIS — Z792 Long term (current) use of antibiotics: Secondary | ICD-10-CM | POA: Diagnosis not present

## 2016-10-30 DIAGNOSIS — C679 Malignant neoplasm of bladder, unspecified: Secondary | ICD-10-CM | POA: Diagnosis not present

## 2016-11-05 ENCOUNTER — Other Ambulatory Visit: Payer: Self-pay | Admitting: *Deleted

## 2016-11-05 DIAGNOSIS — C679 Malignant neoplasm of bladder, unspecified: Secondary | ICD-10-CM

## 2016-11-05 DIAGNOSIS — C772 Secondary and unspecified malignant neoplasm of intra-abdominal lymph nodes: Secondary | ICD-10-CM

## 2016-11-08 ENCOUNTER — Ambulatory Visit (HOSPITAL_BASED_OUTPATIENT_CLINIC_OR_DEPARTMENT_OTHER): Payer: PPO

## 2016-11-08 ENCOUNTER — Other Ambulatory Visit (HOSPITAL_BASED_OUTPATIENT_CLINIC_OR_DEPARTMENT_OTHER): Payer: PPO

## 2016-11-08 ENCOUNTER — Ambulatory Visit (HOSPITAL_BASED_OUTPATIENT_CLINIC_OR_DEPARTMENT_OTHER): Payer: PPO | Admitting: Hematology & Oncology

## 2016-11-08 ENCOUNTER — Ambulatory Visit: Payer: PPO

## 2016-11-08 ENCOUNTER — Ambulatory Visit: Payer: PPO | Admitting: Hematology & Oncology

## 2016-11-08 DIAGNOSIS — C679 Malignant neoplasm of bladder, unspecified: Secondary | ICD-10-CM

## 2016-11-08 DIAGNOSIS — C772 Secondary and unspecified malignant neoplasm of intra-abdominal lymph nodes: Secondary | ICD-10-CM

## 2016-11-08 DIAGNOSIS — Z5111 Encounter for antineoplastic chemotherapy: Secondary | ICD-10-CM

## 2016-11-08 LAB — CBC WITH DIFFERENTIAL (CANCER CENTER ONLY)
BASO#: 0.1 10*3/uL (ref 0.0–0.2)
BASO%: 1 % (ref 0.0–2.0)
EOS%: 2.3 % (ref 0.0–7.0)
Eosinophils Absolute: 0.3 10*3/uL (ref 0.0–0.5)
HCT: 40.1 % (ref 38.7–49.9)
HGB: 13.4 g/dL (ref 13.0–17.1)
LYMPH#: 5.4 10*3/uL — ABNORMAL HIGH (ref 0.9–3.3)
LYMPH%: 47.7 % (ref 14.0–48.0)
MCH: 28.6 pg (ref 28.0–33.4)
MCHC: 33.4 g/dL (ref 32.0–35.9)
MCV: 86 fL (ref 82–98)
MONO#: 0.8 10*3/uL (ref 0.1–0.9)
MONO%: 6.9 % (ref 0.0–13.0)
NEUT#: 4.8 10*3/uL (ref 1.5–6.5)
NEUT%: 42.1 % (ref 40.0–80.0)
Platelets: 256 10*3/uL (ref 145–400)
RBC: 4.69 10*6/uL (ref 4.20–5.70)
RDW: 12.8 % (ref 11.1–15.7)
WBC: 11.3 10*3/uL — ABNORMAL HIGH (ref 4.0–10.0)

## 2016-11-08 LAB — CMP (CANCER CENTER ONLY)
ALT(SGPT): 23 U/L (ref 10–47)
AST: 26 U/L (ref 11–38)
Albumin: 3.2 g/dL — ABNORMAL LOW (ref 3.3–5.5)
Alkaline Phosphatase: 74 U/L (ref 26–84)
BUN, Bld: 18 mg/dL (ref 7–22)
CO2: 29 mEq/L (ref 18–33)
Calcium: 9.1 mg/dL (ref 8.0–10.3)
Chloride: 99 mEq/L (ref 98–108)
Creat: 0.9 mg/dl (ref 0.6–1.2)
Glucose, Bld: 124 mg/dL — ABNORMAL HIGH (ref 73–118)
Potassium: 4.1 mEq/L (ref 3.3–4.7)
Sodium: 140 mEq/L (ref 128–145)
Total Bilirubin: 0.6 mg/dl (ref 0.20–1.60)
Total Protein: 7 g/dL (ref 6.4–8.1)

## 2016-11-08 MED ORDER — HEPARIN SOD (PORK) LOCK FLUSH 100 UNIT/ML IV SOLN
500.0000 [IU] | Freq: Once | INTRAVENOUS | Status: AC | PRN
  Administered 2016-11-08: 500 [IU]
  Filled 2016-11-08: qty 5

## 2016-11-08 MED ORDER — SODIUM CHLORIDE 0.9 % IV SOLN
2600.0000 mg | Freq: Once | INTRAVENOUS | Status: AC
  Administered 2016-11-08: 2600 mg via INTRAVENOUS
  Filled 2016-11-08: qty 52.6

## 2016-11-08 MED ORDER — SODIUM CHLORIDE 0.9% FLUSH
10.0000 mL | INTRAVENOUS | Status: DC | PRN
  Administered 2016-11-08: 10 mL
  Filled 2016-11-08: qty 10

## 2016-11-08 MED ORDER — FOSAPREPITANT DIMEGLUMINE INJECTION 150 MG
Freq: Once | INTRAVENOUS | Status: AC
  Administered 2016-11-08: 12:00:00 via INTRAVENOUS
  Filled 2016-11-08: qty 5

## 2016-11-08 MED ORDER — PALONOSETRON HCL INJECTION 0.25 MG/5ML
0.2500 mg | Freq: Once | INTRAVENOUS | Status: AC
  Administered 2016-11-08: 0.25 mg via INTRAVENOUS

## 2016-11-08 MED ORDER — SODIUM CHLORIDE 0.9 % IV SOLN
Freq: Once | INTRAVENOUS | Status: AC
  Administered 2016-11-08: 09:00:00 via INTRAVENOUS

## 2016-11-08 MED ORDER — PALONOSETRON HCL INJECTION 0.25 MG/5ML
INTRAVENOUS | Status: AC
  Filled 2016-11-08: qty 5

## 2016-11-08 MED ORDER — SODIUM CHLORIDE 0.9 % IV SOLN
70.0000 mg/m2 | Freq: Once | INTRAVENOUS | Status: AC
  Administered 2016-11-08: 176 mg via INTRAVENOUS
  Filled 2016-11-08: qty 176

## 2016-11-08 MED ORDER — POTASSIUM CHLORIDE 2 MEQ/ML IV SOLN
Freq: Once | INTRAVENOUS | Status: AC
  Administered 2016-11-08: 10:00:00 via INTRAVENOUS
  Filled 2016-11-08: qty 10

## 2016-11-08 NOTE — Patient Instructions (Signed)
Gemcitabine injection What is this medicine? GEMCITABINE (jem SIT a been) is a chemotherapy drug. This medicine is used to treat many types of cancer like breast cancer, lung cancer, pancreatic cancer, and ovarian cancer. This medicine may be used for other purposes; ask your health care provider or pharmacist if you have questions. COMMON BRAND NAME(S): Gemzar What should I tell my health care provider before I take this medicine? They need to know if you have any of these conditions: -blood disorders -infection -kidney disease -liver disease -recent or ongoing radiation therapy -an unusual or allergic reaction to gemcitabine, other chemotherapy, other medicines, foods, dyes, or preservatives -pregnant or trying to get pregnant -breast-feeding How should I use this medicine? This drug is given as an infusion into a vein. It is administered in a hospital or clinic by a specially trained health care professional. Talk to your pediatrician regarding the use of this medicine in children. Special care may be needed. Overdosage: If you think you have taken too much of this medicine contact a poison control center or emergency room at once. NOTE: This medicine is only for you. Do not share this medicine with others. What if I miss a dose? It is important not to miss your dose. Call your doctor or health care professional if you are unable to keep an appointment. What may interact with this medicine? -medicines to increase blood counts like filgrastim, pegfilgrastim, sargramostim -some other chemotherapy drugs like cisplatin -vaccines Talk to your doctor or health care professional before taking any of these medicines: -acetaminophen -aspirin -ibuprofen -ketoprofen -naproxen This list may not describe all possible interactions. Give your health care provider a list of all the medicines, herbs, non-prescription drugs, or dietary supplements you use. Also tell them if you smoke, drink alcohol,  or use illegal drugs. Some items may interact with your medicine. What should I watch for while using this medicine? Visit your doctor for checks on your progress. This drug may make you feel generally unwell. This is not uncommon, as chemotherapy can affect healthy cells as well as cancer cells. Report any side effects. Continue your course of treatment even though you feel ill unless your doctor tells you to stop. In some cases, you may be given additional medicines to help with side effects. Follow all directions for their use. Call your doctor or health care professional for advice if you get a fever, chills or sore throat, or other symptoms of a cold or flu. Do not treat yourself. This drug decreases your body's ability to fight infections. Try to avoid being around people who are sick. This medicine may increase your risk to bruise or bleed. Call your doctor or health care professional if you notice any unusual bleeding. Be careful brushing and flossing your teeth or using a toothpick because you may get an infection or bleed more easily. If you have any dental work done, tell your dentist you are receiving this medicine. Avoid taking products that contain aspirin, acetaminophen, ibuprofen, naproxen, or ketoprofen unless instructed by your doctor. These medicines may hide a fever. Women should inform their doctor if they wish to become pregnant or think they might be pregnant. There is a potential for serious side effects to an unborn child. Talk to your health care professional or pharmacist for more information. Do not breast-feed an infant while taking this medicine. What side effects may I notice from receiving this medicine? Side effects that you should report to your doctor or health care professional as  soon as possible: -allergic reactions like skin rash, itching or hives, swelling of the face, lips, or tongue -low blood counts - this medicine may decrease the number of white blood cells,  red blood cells and platelets. You may be at increased risk for infections and bleeding. -signs of infection - fever or chills, cough, sore throat, pain or difficulty passing urine -signs of decreased platelets or bleeding - bruising, pinpoint red spots on the skin, black, tarry stools, blood in the urine -signs of decreased red blood cells - unusually weak or tired, fainting spells, lightheadedness -breathing problems -chest pain -mouth sores -nausea and vomiting -pain, swelling, redness at site where injected -pain, tingling, numbness in the hands or feet -stomach pain -swelling of ankles, feet, hands -unusual bleeding Side effects that usually do not require medical attention (report to your doctor or health care professional if they continue or are bothersome): -constipation -diarrhea -hair loss -loss of appetite -stomach upset This list may not describe all possible side effects. Call your doctor for medical advice about side effects. You may report side effects to FDA at 1-800-FDA-1088. Where should I keep my medicine? This drug is given in a hospital or clinic and will not be stored at home. NOTE: This sheet is a summary. It may not cover all possible information. If you have questions about this medicine, talk to your doctor, pharmacist, or health care provider.  2018 Elsevier/Gold Standard (2007-08-08 18:45:54) Cisplatin injection What is this medicine? CISPLATIN (SIS pla tin) is a chemotherapy drug. It targets fast dividing cells, like cancer cells, and causes these cells to die. This medicine is used to treat many types of cancer like bladder, ovarian, and testicular cancers. This medicine may be used for other purposes; ask your health care provider or pharmacist if you have questions. COMMON BRAND NAME(S): Platinol, Platinol -AQ What should I tell my health care provider before I take this medicine? They need to know if you have any of these conditions: -blood  disorders -hearing problems -kidney disease -recent or ongoing radiation therapy -an unusual or allergic reaction to cisplatin, carboplatin, other chemotherapy, other medicines, foods, dyes, or preservatives -pregnant or trying to get pregnant -breast-feeding How should I use this medicine? This drug is given as an infusion into a vein. It is administered in a hospital or clinic by a specially trained health care professional. Talk to your pediatrician regarding the use of this medicine in children. Special care may be needed. Overdosage: If you think you have taken too much of this medicine contact a poison control center or emergency room at once. NOTE: This medicine is only for you. Do not share this medicine with others. What if I miss a dose? It is important not to miss a dose. Call your doctor or health care professional if you are unable to keep an appointment. What may interact with this medicine? -dofetilide -foscarnet -medicines for seizures -medicines to increase blood counts like filgrastim, pegfilgrastim, sargramostim -probenecid -pyridoxine used with altretamine -rituximab -some antibiotics like amikacin, gentamicin, neomycin, polymyxin B, streptomycin, tobramycin -sulfinpyrazone -vaccines -zalcitabine Talk to your doctor or health care professional before taking any of these medicines: -acetaminophen -aspirin -ibuprofen -ketoprofen -naproxen This list may not describe all possible interactions. Give your health care provider a list of all the medicines, herbs, non-prescription drugs, or dietary supplements you use. Also tell them if you smoke, drink alcohol, or use illegal drugs. Some items may interact with your medicine. What should I watch for while using this  medicine? Your condition will be monitored carefully while you are receiving this medicine. You will need important blood work done while you are taking this medicine. This drug may make you feel generally  unwell. This is not uncommon, as chemotherapy can affect healthy cells as well as cancer cells. Report any side effects. Continue your course of treatment even though you feel ill unless your doctor tells you to stop. In some cases, you may be given additional medicines to help with side effects. Follow all directions for their use. Call your doctor or health care professional for advice if you get a fever, chills or sore throat, or other symptoms of a cold or flu. Do not treat yourself. This drug decreases your body's ability to fight infections. Try to avoid being around people who are sick. This medicine may increase your risk to bruise or bleed. Call your doctor or health care professional if you notice any unusual bleeding. Be careful brushing and flossing your teeth or using a toothpick because you may get an infection or bleed more easily. If you have any dental work done, tell your dentist you are receiving this medicine. Avoid taking products that contain aspirin, acetaminophen, ibuprofen, naproxen, or ketoprofen unless instructed by your doctor. These medicines may hide a fever. Do not become pregnant while taking this medicine. Women should inform their doctor if they wish to become pregnant or think they might be pregnant. There is a potential for serious side effects to an unborn child. Talk to your health care professional or pharmacist for more information. Do not breast-feed an infant while taking this medicine. Drink fluids as directed while you are taking this medicine. This will help protect your kidneys. Call your doctor or health care professional if you get diarrhea. Do not treat yourself. What side effects may I notice from receiving this medicine? Side effects that you should report to your doctor or health care professional as soon as possible: -allergic reactions like skin rash, itching or hives, swelling of the face, lips, or tongue -signs of infection - fever or chills, cough,  sore throat, pain or difficulty passing urine -signs of decreased platelets or bleeding - bruising, pinpoint red spots on the skin, black, tarry stools, nosebleeds -signs of decreased red blood cells - unusually weak or tired, fainting spells, lightheadedness -breathing problems -changes in hearing -gout pain -low blood counts - This drug may decrease the number of white blood cells, red blood cells and platelets. You may be at increased risk for infections and bleeding. -nausea and vomiting -pain, swelling, redness or irritation at the injection site -pain, tingling, numbness in the hands or feet -problems with balance, movement -trouble passing urine or change in the amount of urine Side effects that usually do not require medical attention (report to your doctor or health care professional if they continue or are bothersome): -changes in vision -loss of appetite -metallic taste in the mouth or changes in taste This list may not describe all possible side effects. Call your doctor for medical advice about side effects. You may report side effects to FDA at 1-800-FDA-1088. Where should I keep my medicine? This drug is given in a hospital or clinic and will not be stored at home. NOTE: This sheet is a summary. It may not cover all possible information. If you have questions about this medicine, talk to your doctor, pharmacist, or health care provider.  2018 Elsevier/Gold Standard (2007-07-04 14:40:54)

## 2016-11-08 NOTE — Patient Instructions (Signed)
Implanted Port Home Guide An implanted port is a type of central line that is placed under the skin. Central lines are used to provide IV access when treatment or nutrition needs to be given through a person's veins. Implanted ports are used for long-term IV access. An implanted port may be placed because:  You need IV medicine that would be irritating to the small veins in your hands or arms.  You need long-term IV medicines, such as antibiotics.  You need IV nutrition for a long period.  You need frequent blood draws for lab tests.  You need dialysis.  Implanted ports are usually placed in the chest area, but they can also be placed in the upper arm, the abdomen, or the leg. An implanted port has two main parts:  Reservoir. The reservoir is round and will appear as a small, raised area under your skin. The reservoir is the part where a needle is inserted to give medicines or draw blood.  Catheter. The catheter is a thin, flexible tube that extends from the reservoir. The catheter is placed into a large vein. Medicine that is inserted into the reservoir goes into the catheter and then into the vein.  How will I care for my incision site? Do not get the incision site wet. Bathe or shower as directed by your health care provider. How is my port accessed? Special steps must be taken to access the port:  Before the port is accessed, a numbing cream can be placed on the skin. This helps numb the skin over the port site.  Your health care provider uses a sterile technique to access the port. ? Your health care provider must put on a mask and sterile gloves. ? The skin over your port is cleaned carefully with an antiseptic and allowed to dry. ? The port is gently pinched between sterile gloves, and a needle is inserted into the port.  Only "non-coring" port needles should be used to access the port. Once the port is accessed, a blood return should be checked. This helps ensure that the port  is in the vein and is not clogged.  If your port needs to remain accessed for a constant infusion, a clear (transparent) bandage will be placed over the needle site. The bandage and needle will need to be changed every week, or as directed by your health care provider.  Keep the bandage covering the needle clean and dry. Do not get it wet. Follow your health care provider's instructions on how to take a shower or bath while the port is accessed.  If your port does not need to stay accessed, no bandage is needed over the port.  What is flushing? Flushing helps keep the port from getting clogged. Follow your health care provider's instructions on how and when to flush the port. Ports are usually flushed with saline solution or a medicine called heparin. The need for flushing will depend on how the port is used.  If the port is used for intermittent medicines or blood draws, the port will need to be flushed: ? After medicines have been given. ? After blood has been drawn. ? As part of routine maintenance.  If a constant infusion is running, the port may not need to be flushed.  How long will my port stay implanted? The port can stay in for as long as your health care provider thinks it is needed. When it is time for the port to come out, surgery will be   done to remove it. The procedure is similar to the one performed when the port was put in. When should I seek immediate medical care? When you have an implanted port, you should seek immediate medical care if:  You notice a bad smell coming from the incision site.  You have swelling, redness, or drainage at the incision site.  You have more swelling or pain at the port site or the surrounding area.  You have a fever that is not controlled with medicine.  This information is not intended to replace advice given to you by your health care provider. Make sure you discuss any questions you have with your health care provider. Document  Released: 03/29/2005 Document Revised: 09/04/2015 Document Reviewed: 12/04/2012 Elsevier Interactive Patient Education  2017 [employer]  

## 2016-11-08 NOTE — Progress Notes (Signed)
Pt urinated 1100cc prior to Cisplatin infusion & 1200cc post Cisplatin. dph

## 2016-11-08 NOTE — Progress Notes (Signed)
Hematology and Oncology Follow Up Visit  [name] Rose [id number] 1951/03/19 66 y.o. 11/08/2016   Principle Diagnosis:   Metastatic high-grade bladder cancer  Current Therapy:    Status post cycle 1 of abbreviated cisplatin/gemcitabine-patient hospitalized with listeria bacteremia     Interim History:  [name] Rose is back for follow-up. 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 I really don't think that the chemotherapy really had anything to do with his listeria. He really was not neutropenic.  He looks great today. He's been playing golf. He's had no diarrhea. He's had no fever. He's had no abdominal pain. He's had no cough. He's had no leg swelling. He's had no rashes.  Overall, his performance status is ECOG 0.  Medications:  Current Outpatient Prescriptions:  .  amLODipine (NORVASC) 10 MG tablet, Take 10 mg by mouth every morning., Disp: , Rfl:  .  bisoprolol-hydrochlorothiazide (ZIAC) 10-6.25 MG tablet, Take 1 tablet by mouth every evening. , Disp: , Rfl:  .  Cinnamon 500 MG TABS, Take 1 tablet by mouth 2 (two) times daily. , Disp: , Rfl:  .  dexamethasone (DECADRON) 4 MG tablet, Take 2 tablets by mouth once a day on the day after cisplatin chemotherapy and then take 2 tablets two times a day for 2 days. Take with food., Disp: 30 tablet, Rfl: 1 .  lidocaine-prilocaine (EMLA) cream, Apply to affected area once (Patient taking differently: Apply 1 application topically once. Port), Disp: 30 g, Rfl: 3 .  LORazepam (ATIVAN) 0.5 MG tablet, Take 1 tablet (0.5 mg total) by mouth every 6 (six) hours as needed (Nausea or vomiting)., Disp: 30 tablet, Rfl: 0 .  losartan (COZAAR) 100 MG tablet, Take 100 mg by mouth every morning. , Disp: , Rfl:  .  MAGNESIUM PO, Take 400 mg by mouth at bedtime. Leg cramps, Disp: , Rfl:  .  Naproxen Sodium (ALEVE) 220 MG CAPS, Take 220 mg by mouth 2 (two) times daily. , Disp: , Rfl:  .   ondansetron (ZOFRAN) 8 MG tablet, Take 1 tablet (8 mg total) by mouth 2 (two) times daily as needed. Start on the third day after cisplatin chemotherapy., Disp: 30 tablet, Rfl: 1 .  potassium chloride SA (K-DUR,KLOR-CON) 20 MEQ tablet, Take 1 tablet (20 mEq total) by mouth daily., Disp: 5 tablet, Rfl: 0 .  prochlorperazine (COMPAZINE) 10 MG tablet, Take 1 tablet (10 mg total) by mouth every 6 (six) hours as needed (Nausea or vomiting)., Disp: 30 tablet, Rfl: 1  Allergies: No Known Allergies  Past Medical History, Surgical history, Social history, and Family History were reviewed and updated.  Review of Systems: As above  Physical Exam:  vitals were not taken for this visit.  Wt Readings from Last 3 Encounters:  11/08/16 261 lb 8 oz (118.6 kg)  10/21/16 260 lb 3.2 oz (118 kg)  10/16/16 265 lb (120.2 kg)     Head and neck exam shows no ocular or oral lesions. There are no palpable cervical or supraclavicular lymph nodes. Lungs are clear. Cardiac exam regular rate and rhythm with no murmurs, rubs or bruits. Abdomen is soft. He has good bowel sounds. There is no fluid wave. There are no palpable abdominal masses. There is no palpable adenopathy in the inguinal region. There is no palpable liver and spleen tip. Back exam shows no tenderness over the spine, ribs or hips. Extremities shows no clubbing, cyanosis or edema. Has good range motion  of his joints. He does have scars over his knees bilaterally from surgery. Skin exam shows no rashes, ecchymoses or petechia. Neurological exam shows no focal neurological deficits.  Lab Results  Component Value Date   WBC 11.3 (H) 11/08/2016   HGB 13.4 11/08/2016   HCT 40.1 11/08/2016   MCV 86 11/08/2016   PLT 256 11/08/2016     Chemistry      Component Value Date/Time   NA 135 10/21/2016 0425   NA 137 10/11/2016 0749   K 3.2 (L) 10/21/2016 0425   K 3.6 10/11/2016 0749   CL 99 (L) 10/21/2016 0425   CL 100 10/11/2016 0749   CO2 30 10/21/2016  0425   CO2 29 10/11/2016 0749   BUN 9 10/21/2016 0425   BUN 14 10/11/2016 0749   CREATININE 0.65 10/21/2016 0425   CREATININE 1.2 10/11/2016 0749      Component Value Date/Time   CALCIUM 7.7 (L) 10/21/2016 0425   CALCIUM 9.1 10/11/2016 0749   ALKPHOS 39 10/21/2016 0425   ALKPHOS 69 10/11/2016 0749   AST 37 10/21/2016 0425   AST 36 10/11/2016 0749   ALT 50 10/21/2016 0425   ALT 28 10/11/2016 0749   BILITOT 0.4 10/21/2016 0425   BILITOT 0.80 10/11/2016 0749         Impression and Plan: [name] Rose is aA 66 year old white male. He is in very good shape. 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 I'm still not how he contracted this.  We will basically get restarted. His first treatment was probably a month ago.  I still feel very common and that he will respond to treatment.  We will plan to get him back in 3 weeks.  We probably will repeat his PET scan after his third cycle.   Volanda Napoleon, MD 7/30/20189:31 AM

## 2016-11-15 ENCOUNTER — Other Ambulatory Visit (HOSPITAL_BASED_OUTPATIENT_CLINIC_OR_DEPARTMENT_OTHER): Payer: PPO

## 2016-11-15 ENCOUNTER — Ambulatory Visit (HOSPITAL_BASED_OUTPATIENT_CLINIC_OR_DEPARTMENT_OTHER): Payer: PPO | Admitting: Family

## 2016-11-15 ENCOUNTER — Ambulatory Visit: Payer: PPO

## 2016-11-15 ENCOUNTER — Ambulatory Visit (HOSPITAL_BASED_OUTPATIENT_CLINIC_OR_DEPARTMENT_OTHER): Payer: PPO

## 2016-11-15 VITALS — BP 113/72 | HR 83 | Temp 97.9°F | Resp 18 | Wt 253.0 lb

## 2016-11-15 DIAGNOSIS — C679 Malignant neoplasm of bladder, unspecified: Secondary | ICD-10-CM

## 2016-11-15 DIAGNOSIS — R634 Abnormal weight loss: Secondary | ICD-10-CM | POA: Diagnosis not present

## 2016-11-15 DIAGNOSIS — R5383 Other fatigue: Secondary | ICD-10-CM

## 2016-11-15 DIAGNOSIS — Z5111 Encounter for antineoplastic chemotherapy: Secondary | ICD-10-CM | POA: Diagnosis not present

## 2016-11-15 DIAGNOSIS — C772 Secondary and unspecified malignant neoplasm of intra-abdominal lymph nodes: Secondary | ICD-10-CM

## 2016-11-15 LAB — CMP (CANCER CENTER ONLY)
ALT(SGPT): 32 U/L (ref 10–47)
AST: 28 U/L (ref 11–38)
Albumin: 3.2 g/dL — ABNORMAL LOW (ref 3.3–5.5)
Alkaline Phosphatase: 60 U/L (ref 26–84)
BUN, Bld: 27 mg/dL — ABNORMAL HIGH (ref 7–22)
CO2: 31 mEq/L (ref 18–33)
Calcium: 9 mg/dL (ref 8.0–10.3)
Chloride: 99 mEq/L (ref 98–108)
Creat: 1.2 mg/dl (ref 0.6–1.2)
Glucose, Bld: 149 mg/dL — ABNORMAL HIGH (ref 73–118)
Potassium: 3.4 mEq/L (ref 3.3–4.7)
Sodium: 135 mEq/L (ref 128–145)
Total Bilirubin: 0.7 mg/dl (ref 0.20–1.60)
Total Protein: 6.2 g/dL — ABNORMAL LOW (ref 6.4–8.1)

## 2016-11-15 LAB — CBC WITH DIFFERENTIAL (CANCER CENTER ONLY)
BASO#: 0 10*3/uL (ref 0.0–0.2)
BASO%: 0.1 % (ref 0.0–2.0)
EOS%: 2.4 % (ref 0.0–7.0)
Eosinophils Absolute: 0.2 10*3/uL (ref 0.0–0.5)
HCT: 39.9 % (ref 38.7–49.9)
HGB: 13.8 g/dL (ref 13.0–17.1)
LYMPH#: 4.8 10*3/uL — ABNORMAL HIGH (ref 0.9–3.3)
LYMPH%: 54.5 % — ABNORMAL HIGH (ref 14.0–48.0)
MCH: 28.6 pg (ref 28.0–33.4)
MCHC: 34.6 g/dL (ref 32.0–35.9)
MCV: 83 fL (ref 82–98)
MONO#: 0.3 10*3/uL (ref 0.1–0.9)
MONO%: 3.5 % (ref 0.0–13.0)
NEUT#: 3.5 10*3/uL (ref 1.5–6.5)
NEUT%: 39.5 % — ABNORMAL LOW (ref 40.0–80.0)
Platelets: 187 10*3/uL (ref 145–400)
RBC: 4.82 10*6/uL (ref 4.20–5.70)
RDW: 12.7 % (ref 11.1–15.7)
WBC: 8.9 10*3/uL (ref 4.0–10.0)

## 2016-11-15 LAB — LACTATE DEHYDROGENASE: LDH: 198 U/L (ref 125–245)

## 2016-11-15 MED ORDER — PROCHLORPERAZINE MALEATE 10 MG PO TABS: 10.0000 mg | ORAL_TABLET | Freq: Once | ORAL | Status: DC

## 2016-11-15 MED ORDER — HEPARIN SOD (PORK) LOCK FLUSH 100 UNIT/ML IV SOLN
500.0000 [IU] | Freq: Once | INTRAVENOUS | Status: DC | PRN
  Filled 2016-11-15: qty 5

## 2016-11-15 MED ORDER — GEMCITABINE HCL CHEMO INJECTION 1 GM/26.3ML
2600.0000 mg | Freq: Once | INTRAVENOUS | Status: AC
  Administered 2016-11-15: 2600 mg via INTRAVENOUS
  Filled 2016-11-15: qty 52.6

## 2016-11-15 MED ORDER — SODIUM CHLORIDE 0.9% FLUSH
10.0000 mL | INTRAVENOUS | Status: DC | PRN
  Filled 2016-11-15: qty 10

## 2016-11-15 MED ORDER — SODIUM CHLORIDE 0.9 % IV SOLN
Freq: Once | INTRAVENOUS | Status: AC
  Administered 2016-11-15: 09:00:00 via INTRAVENOUS

## 2016-11-15 NOTE — Progress Notes (Signed)
Hematology and Oncology Follow Up Visit  [name] Rose [id number] July 23, 1950 66 y.o. 11/15/2016   Principle Diagnosis:  Metastatic high-grade bladder cancer  Current Therapy:   Status post cycle 1 of abbreviated cisplatin/gemcitabine - patient hospitalized with listeria bacteremia   Interim History:  [name] Rose is here today for follow-up and treatment. He is feeling better but still having some fatigue. He is taking breaks to rest as needed. He has been able to get out and do yard work and hopes to [employer] part of his lawn this evening.  He states that his taste is "off". He admits that he has not been eating or hydrating like he should. His weight is down 8 lbs since his last visit on 7/30.  He has had some mild nausea no vomiting. He takes his compazine and zofran off and on as needed.  No fever, chills, cough, rash, dizziness, SOB, chest pain, palpitations, abdominal pain or changes in bladder habits. No difficulty urinating. His constipation has resolved. He is taking a stool softener as needed.  No swelling, tenderness, numbness or tingling in his extremities. No c/o pain at this time.   ECOG Performance Status: 1 - Symptomatic but completely ambulatory  Medications:  Allergies as of 11/15/2016   No Known Allergies     Medication List       Accurate as of 11/15/16  8:41 AM. Always use your most recent med list.          ALEVE 220 MG Caps Generic drug:  Naproxen Sodium Take 220 mg by mouth 2 (two) times daily.   amLODipine 10 MG tablet Commonly known as:  NORVASC Take 10 mg by mouth every morning.   bisoprolol-hydrochlorothiazide 10-6.25 MG tablet Commonly known as:  ZIAC Take 1 tablet by mouth every evening.   Cinnamon 500 MG Tabs Take 1 tablet by mouth 2 (two) times daily.   dexamethasone 4 MG tablet Commonly known as:  DECADRON Take 2 tablets by mouth once a day on the day after cisplatin chemotherapy and then take 2 tablets two times a day for 2 days. Take with  food.   lidocaine-prilocaine cream Commonly known as:  EMLA Apply to affected area once   LORazepam 0.5 MG tablet Commonly known as:  ATIVAN Take 1 tablet (0.5 mg total) by mouth every 6 (six) hours as needed (Nausea or vomiting).   losartan 100 MG tablet Commonly known as:  COZAAR Take 100 mg by mouth every morning.   MAGNESIUM PO Take 400 mg by mouth at bedtime. Leg cramps   ondansetron 8 MG tablet Commonly known as:  ZOFRAN Take 1 tablet (8 mg total) by mouth 2 (two) times daily as needed. Start on the third day after cisplatin chemotherapy.   potassium chloride SA 20 MEQ tablet Commonly known as:  K-DUR,KLOR-CON Take 1 tablet (20 mEq total) by mouth daily.   prochlorperazine 10 MG tablet Commonly known as:  COMPAZINE Take 1 tablet (10 mg total) by mouth every 6 (six) hours as needed (Nausea or vomiting).       Allergies: No Known Allergies  Past Medical History, Surgical history, Social history, and Family History were reviewed and updated.  Review of Systems: All other 10 point review of systems is negative.   Physical Exam:  vitals were not taken for this visit.  Wt Readings from Last 3 Encounters:  11/08/16 261 lb 8 oz (118.6 kg)  10/21/16 260 lb 3.2 oz (118 kg)  10/16/16 265 lb (120.2 kg)  Ocular: Sclerae unicteric, pupils equal, round and reactive to light Ear-nose-throat: Oropharynx clear, dentition fair Lymphatic: No cervical, supraclavicular or axillary adenopathy Lungs no rales or rhonchi, good excursion bilaterally Heart regular rate and rhythm, no murmur appreciated Abd soft, nontender, positive bowel sounds, no liver or spleen tip palpated on exam, no fluid wave MSK no focal spinal tenderness, no joint edema Neuro: non-focal, well-oriented, appropriate affect Breasts: Deferred   Lab Results  Component Value Date   WBC 11.3 (H) 11/08/2016   HGB 13.4 11/08/2016   HCT 40.1 11/08/2016   MCV 86 11/08/2016   PLT 256 11/08/2016   No results  found for: FERRITIN, IRON, TIBC, UIBC, IRONPCTSAT Lab Results  Component Value Date   RBC 4.69 11/08/2016   No results found for: KPAFRELGTCHN, LAMBDASER, KAPLAMBRATIO No results found for: IGGSERUM, IGA, IGMSERUM No results found for: [name], SPEI   Chemistry      Component Value Date/Time   NA 140 11/08/2016 0838   K 4.1 11/08/2016 0838   CL 99 11/08/2016 0838   CO2 29 11/08/2016 0838   BUN 18 11/08/2016 0838   CREATININE 0.9 11/08/2016 0838      Component Value Date/Time   CALCIUM 9.1 11/08/2016 0838   ALKPHOS 74 11/08/2016 0838   AST 26 11/08/2016 0838   ALT 23 11/08/2016 0838   BILITOT 0.60 11/08/2016 0838      Impression and Plan: [name] Rose is a 66 yo caucasian gentleman with metastatic high grade bladder cancer. He has recuperated nicely from his bout with bacterial listeria during his first cycle of chemo. He is still having some fatigue and weight loss. He admits that he needs to eat and stay better hydrated. He will use his antiemetics as needed. We will continue to monitor his weight with each visit and intervene if necessary to help stimulate his appetite and weight gain.   His WBC count is now 8.9. No anemia. Platelet count is 187.  We will proceed with treatment today as planned per [name].  He has his current treatment and appointment schedule. We will plan to see him back again in the 20th for lab, follow-up and infusion.  He will contact our office with any questions or concerns. We can certainy see him sooner if need be.   Eliezer Bottom, NP 8/6/20188:41 AM

## 2016-11-15 NOTE — Patient Instructions (Signed)
[facility] Discharge Instructions for Patients Receiving Chemotherapy  Today you received the following chemotherapy agents:  Gemzar  To help prevent nausea and vomiting after your treatment, we encourage you to take your nausea medication as ordered per MD.   If you develop nausea and vomiting that is not controlled by your nausea medication, call the clinic.   BELOW ARE SYMPTOMS THAT SHOULD BE REPORTED IMMEDIATELY:  *FEVER GREATER THAN 100.5 F  *CHILLS WITH OR WITHOUT FEVER  NAUSEA AND VOMITING THAT IS NOT CONTROLLED WITH YOUR NAUSEA MEDICATION  *UNUSUAL SHORTNESS OF BREATH  *UNUSUAL BRUISING OR BLEEDING  TENDERNESS IN MOUTH AND THROAT WITH OR WITHOUT PRESENCE OF ULCERS  *URINARY PROBLEMS  *BOWEL PROBLEMS  UNUSUAL RASH Items with * indicate a potential emergency and should be followed up as soon as possible.  Feel free to call the clinic you have any questions or concerns. The clinic phone number is [phone].  Please show the [facility] at check-in to the Emergency Department and triage nurse.

## 2016-11-15 NOTE — Patient Instructions (Signed)
Implanted Port Home Guide An implanted port is a type of central line that is placed under the skin. Central lines are used to provide IV access when treatment or nutrition needs to be given through a person's veins. Implanted ports are used for long-term IV access. An implanted port may be placed because:  You need IV medicine that would be irritating to the small veins in your hands or arms.  You need long-term IV medicines, such as antibiotics.  You need IV nutrition for a long period.  You need frequent blood draws for lab tests.  You need dialysis.  Implanted ports are usually placed in the chest area, but they can also be placed in the upper arm, the abdomen, or the leg. An implanted port has two main parts:  Reservoir. The reservoir is round and will appear as a small, raised area under your skin. The reservoir is the part where a needle is inserted to give medicines or draw blood.  Catheter. The catheter is a thin, flexible tube that extends from the reservoir. The catheter is placed into a large vein. Medicine that is inserted into the reservoir goes into the catheter and then into the vein.  How will I care for my incision site? Do not get the incision site wet. Bathe or shower as directed by your health care provider. How is my port accessed? Special steps must be taken to access the port:  Before the port is accessed, a numbing cream can be placed on the skin. This helps numb the skin over the port site.  Your health care provider uses a sterile technique to access the port. ? Your health care provider must put on a mask and sterile gloves. ? The skin over your port is cleaned carefully with an antiseptic and allowed to dry. ? The port is gently pinched between sterile gloves, and a needle is inserted into the port.  Only "non-coring" port needles should be used to access the port. Once the port is accessed, a blood return should be checked. This helps ensure that the port  is in the vein and is not clogged.  If your port needs to remain accessed for a constant infusion, a clear (transparent) bandage will be placed over the needle site. The bandage and needle will need to be changed every week, or as directed by your health care provider.  Keep the bandage covering the needle clean and dry. Do not get it wet. Follow your health care provider's instructions on how to take a shower or bath while the port is accessed.  If your port does not need to stay accessed, no bandage is needed over the port.  What is flushing? Flushing helps keep the port from getting clogged. Follow your health care provider's instructions on how and when to flush the port. Ports are usually flushed with saline solution or a medicine called heparin. The need for flushing will depend on how the port is used.  If the port is used for intermittent medicines or blood draws, the port will need to be flushed: ? After medicines have been given. ? After blood has been drawn. ? As part of routine maintenance.  If a constant infusion is running, the port may not need to be flushed.  How long will my port stay implanted? The port can stay in for as long as your health care provider thinks it is needed. When it is time for the port to come out, surgery will be   done to remove it. The procedure is similar to the one performed when the port was put in. When should I seek immediate medical care? When you have an implanted port, you should seek immediate medical care if:  You notice a bad smell coming from the incision site.  You have swelling, redness, or drainage at the incision site.  You have more swelling or pain at the port site or the surrounding area.  You have a fever that is not controlled with medicine.  This information is not intended to replace advice given to you by your health care provider. Make sure you discuss any questions you have with your health care provider. Document  Released: 03/29/2005 Document Revised: 09/04/2015 Document Reviewed: 12/04/2012 Elsevier Interactive Patient Education  2017 [employer]  

## 2016-11-29 ENCOUNTER — Ambulatory Visit (HOSPITAL_BASED_OUTPATIENT_CLINIC_OR_DEPARTMENT_OTHER): Payer: PPO

## 2016-11-29 ENCOUNTER — Other Ambulatory Visit (HOSPITAL_BASED_OUTPATIENT_CLINIC_OR_DEPARTMENT_OTHER): Payer: PPO

## 2016-11-29 ENCOUNTER — Ambulatory Visit: Payer: PPO

## 2016-11-29 ENCOUNTER — Ambulatory Visit (HOSPITAL_BASED_OUTPATIENT_CLINIC_OR_DEPARTMENT_OTHER): Payer: PPO | Admitting: Hematology & Oncology

## 2016-11-29 VITALS — BP 128/74 | HR 74 | Temp 98.3°F | Resp 16 | Wt 265.0 lb

## 2016-11-29 DIAGNOSIS — C772 Secondary and unspecified malignant neoplasm of intra-abdominal lymph nodes: Secondary | ICD-10-CM | POA: Diagnosis not present

## 2016-11-29 DIAGNOSIS — C679 Malignant neoplasm of bladder, unspecified: Secondary | ICD-10-CM

## 2016-11-29 DIAGNOSIS — Z5111 Encounter for antineoplastic chemotherapy: Secondary | ICD-10-CM | POA: Diagnosis not present

## 2016-11-29 LAB — CBC WITH DIFFERENTIAL (CANCER CENTER ONLY)
BASO#: 0 10*3/uL (ref 0.0–0.2)
BASO%: 0.6 % (ref 0.0–2.0)
EOS%: 4 % (ref 0.0–7.0)
Eosinophils Absolute: 0.3 10*3/uL (ref 0.0–0.5)
HCT: 36.6 % — ABNORMAL LOW (ref 38.7–49.9)
HGB: 12.3 g/dL — ABNORMAL LOW (ref 13.0–17.1)
LYMPH#: 3.5 10*3/uL — ABNORMAL HIGH (ref 0.9–3.3)
LYMPH%: 53.5 % — ABNORMAL HIGH (ref 14.0–48.0)
MCH: 28.9 pg (ref 28.0–33.4)
MCHC: 33.6 g/dL (ref 32.0–35.9)
MCV: 86 fL (ref 82–98)
MONO#: 0.6 10*3/uL (ref 0.1–0.9)
MONO%: 9.3 % (ref 0.0–13.0)
NEUT#: 2.1 10*3/uL (ref 1.5–6.5)
NEUT%: 32.6 % — ABNORMAL LOW (ref 40.0–80.0)
Platelets: 339 10*3/uL (ref 145–400)
RBC: 4.26 10*6/uL (ref 4.20–5.70)
RDW: 14.4 % (ref 11.1–15.7)
WBC: 6.6 10*3/uL (ref 4.0–10.0)

## 2016-11-29 LAB — CMP (CANCER CENTER ONLY)
ALT(SGPT): 17 U/L (ref 10–47)
AST: 23 U/L (ref 11–38)
Albumin: 3.4 g/dL (ref 3.3–5.5)
Alkaline Phosphatase: 69 U/L (ref 26–84)
BUN, Bld: 15 mg/dL (ref 7–22)
CO2: 33 mEq/L (ref 18–33)
Calcium: 9 mg/dL (ref 8.0–10.3)
Chloride: 98 mEq/L (ref 98–108)
Creat: 1 mg/dl (ref 0.6–1.2)
Glucose, Bld: 158 mg/dL — ABNORMAL HIGH (ref 73–118)
Potassium: 4 mEq/L (ref 3.3–4.7)
Sodium: 141 mEq/L (ref 128–145)
Total Bilirubin: 0.6 mg/dl (ref 0.20–1.60)
Total Protein: 6.5 g/dL (ref 6.4–8.1)

## 2016-11-29 LAB — LACTATE DEHYDROGENASE: LDH: 232 U/L (ref 125–245)

## 2016-11-29 MED ORDER — PALONOSETRON HCL INJECTION 0.25 MG/5ML
INTRAVENOUS | Status: AC
  Filled 2016-11-29: qty 5

## 2016-11-29 MED ORDER — SODIUM CHLORIDE 0.9 % IV SOLN
70.0000 mg/m2 | Freq: Once | INTRAVENOUS | Status: AC
  Administered 2016-11-29: 176 mg via INTRAVENOUS
  Filled 2016-11-29: qty 176

## 2016-11-29 MED ORDER — SODIUM CHLORIDE 0.9 % IV SOLN
2600.0000 mg | Freq: Once | INTRAVENOUS | Status: AC
  Administered 2016-11-29: 2600 mg via INTRAVENOUS
  Filled 2016-11-29: qty 52.6

## 2016-11-29 MED ORDER — POTASSIUM CHLORIDE 2 MEQ/ML IV SOLN
Freq: Once | INTRAVENOUS | Status: AC
  Administered 2016-11-29: 10:00:00 via INTRAVENOUS
  Filled 2016-11-29: qty 10

## 2016-11-29 MED ORDER — SODIUM CHLORIDE 0.9 % IV SOLN
Freq: Once | INTRAVENOUS | Status: AC
  Administered 2016-11-29: 09:00:00 via INTRAVENOUS

## 2016-11-29 MED ORDER — PALONOSETRON HCL INJECTION 0.25 MG/5ML
0.2500 mg | Freq: Once | INTRAVENOUS | Status: AC
  Administered 2016-11-29: 0.25 mg via INTRAVENOUS

## 2016-11-29 MED ORDER — SODIUM CHLORIDE 0.9 % IV SOLN
Freq: Once | INTRAVENOUS | Status: AC
  Administered 2016-11-29: 13:00:00 via INTRAVENOUS
  Filled 2016-11-29: qty 5

## 2016-11-29 MED ORDER — HEPARIN SOD (PORK) LOCK FLUSH 100 UNIT/ML IV SOLN
500.0000 [IU] | Freq: Once | INTRAVENOUS | Status: AC | PRN
  Administered 2016-11-29: 500 [IU]
  Filled 2016-11-29: qty 5

## 2016-11-29 MED ORDER — SODIUM CHLORIDE 0.9% FLUSH
10.0000 mL | INTRAVENOUS | Status: DC | PRN
  Administered 2016-11-29: 10 mL
  Filled 2016-11-29: qty 10

## 2016-11-29 NOTE — Patient Instructions (Signed)
[facility] Discharge Instructions for Patients Receiving Chemotherapy  Today you received the following chemotherapy agents:  Cisplatin and Gemzar To help prevent nausea and vomiting after your treatment, we encourage you to take your nausea medication as ordered per MD.   If you develop nausea and vomiting that is not controlled by your nausea medication, call the clinic.   BELOW ARE SYMPTOMS THAT SHOULD BE REPORTED IMMEDIATELY:  *FEVER GREATER THAN 100.5 F  *CHILLS WITH OR WITHOUT FEVER  NAUSEA AND VOMITING THAT IS NOT CONTROLLED WITH YOUR NAUSEA MEDICATION  *UNUSUAL SHORTNESS OF BREATH  *UNUSUAL BRUISING OR BLEEDING  TENDERNESS IN MOUTH AND THROAT WITH OR WITHOUT PRESENCE OF ULCERS  *URINARY PROBLEMS  *BOWEL PROBLEMS  UNUSUAL RASH Items with * indicate a potential emergency and should be followed up as soon as possible.  Feel free to call the clinic you have any questions or concerns. The clinic phone number is [phone].  Please show the [place] [facility] at check-in to the Emergency Department and triage nurse.

## 2016-11-29 NOTE — Progress Notes (Signed)
Hematology and Oncology Follow Up Visit  NDREW CREASON [id number] [birth year] 66 y.o. 11/29/2016   Principle Diagnosis:   Metastatic high-grade bladder cancer  Current Therapy:   Status post cycle #2 of cisplatin/gemcitabine-      Interim History:  Mr. Vanduyne is back for follow-up. He looks great. Head no problems with his second cycle of treatment. His appetite was down a little bit. He had one mouth sore. I told him to rinse his mouth with water and baking soda for 5 times a day.  He's had no pain. There's been no hematuria. He's had a little bit of constipation.  He's had no leg swelling. He's had no rashes. He's had no cough or shortness of breath.  He's had no tinnitus. There's been no hearing difficulty.  Overall, his performance status is ECOG 0.  Medications:  Current Outpatient Prescriptions:  .  amLODipine (NORVASC) 10 MG tablet, Take 10 mg by mouth every morning., Disp: , Rfl:  .  bisoprolol-hydrochlorothiazide (ZIAC) 10-6.25 MG tablet, Take 1 tablet by mouth every evening. , Disp: , Rfl:  .  Cinnamon 500 MG TABS, Take 1 tablet by mouth 2 (two) times daily. , Disp: , Rfl:  .  dexamethasone (DECADRON) 4 MG tablet, Take 2 tablets by mouth once a day on the day after cisplatin chemotherapy and then take 2 tablets two times a day for 2 days. Take with food., Disp: 30 tablet, Rfl: 1 .  lidocaine-prilocaine (EMLA) cream, Apply to affected area once (Patient taking differently: Apply 1 application topically once. Port), Disp: 30 g, Rfl: 3 .  LORazepam (ATIVAN) 0.5 MG tablet, Take 1 tablet (0.5 mg total) by mouth every 6 (six) hours as needed (Nausea or vomiting)., Disp: 30 tablet, Rfl: 0 .  losartan (COZAAR) 100 MG tablet, Take 100 mg by mouth every morning. , Disp: , Rfl:  .  MAGNESIUM PO, Take 400 mg by mouth at bedtime. Leg cramps, Disp: , Rfl:  .  Naproxen Sodium (ALEVE) 220 MG CAPS, Take 220 mg by mouth 2 (two) times daily. , Disp: , Rfl:  .  ondansetron (ZOFRAN) 8 MG  tablet, Take 1 tablet (8 mg total) by mouth 2 (two) times daily as needed. Start on the third day after cisplatin chemotherapy., Disp: 30 tablet, Rfl: 1 .  potassium chloride SA (K-DUR,KLOR-CON) 20 MEQ tablet, Take 1 tablet (20 mEq total) by mouth daily., Disp: 5 tablet, Rfl: 0 .  prochlorperazine (COMPAZINE) 10 MG tablet, Take 1 tablet (10 mg total) by mouth every 6 (six) hours as needed (Nausea or vomiting)., Disp: 30 tablet, Rfl: 1  Allergies: No Known Allergies  Past Medical History, Surgical history, Social history, and Family History were reviewed and updated.  Review of Systems: As above  Physical Exam:  weight is 265 lb (120.2 kg). His oral temperature is 98.3 F (36.8 C). His blood pressure is 128/74 and his pulse is 74. His respiration is 16 and oxygen saturation is 96%.   Wt Readings from Last 3 Encounters:  11/29/16 265 lb (120.2 kg)  11/15/16 253 lb (114.8 kg)  11/08/16 261 lb 8 oz (118.6 kg)     Head and neck exam shows no ocular or oral lesions. There are no palpable cervical or supraclavicular lymph nodes. Lungs are clear. Cardiac exam regular rate and rhythm with no murmurs, rubs or bruits. Abdomen is soft. He has good bowel sounds. There is no fluid wave. There are no palpable abdominal masses. There is no palpable adenopathy in  the inguinal region. There is no palpable liver and spleen tip. Back exam shows no tenderness over the spine, ribs or hips. Extremities shows no clubbing, cyanosis or edema. Has good range motion of his joints. He does have scars over his knees bilaterally from surgery. Skin exam shows no rashes, ecchymoses or petechia. Neurological exam shows no focal neurological deficits.  Lab Results  Component Value Date   WBC 6.6 11/29/2016   HGB 12.3 (L) 11/29/2016   HCT 36.6 (L) 11/29/2016   MCV 86 11/29/2016   PLT 339 11/29/2016     Chemistry      Component Value Date/Time   NA 135 11/15/2016 0827   K 3.4 11/15/2016 0827   CL 99 11/15/2016 0827     CO2 31 11/15/2016 0827   BUN 27 (H) 11/15/2016 0827   CREATININE 1.2 11/15/2016 0827      Component Value Date/Time   CALCIUM 9.0 11/15/2016 0827   ALKPHOS 60 11/15/2016 0827   AST 28 11/15/2016 0827   ALT 32 11/15/2016 0827   BILITOT 0.70 11/15/2016 0827         Impression and Plan: Mr. Silvestro is a 66 year old white male.I'm just happy that he tolerated his second cycle of treatment so well.  I have to believe that treatment is working for him. I really cannot palpate any lymphadenopathy. He's had no hematuria.  We will proceed with his third cycle of treatment. I will then do a PET scan right after Labor Day.  We will get him back in 3 weeks for his fourth cycle of treatment.   Volanda Napoleon, MD 8/20/20188:37 AM

## 2016-12-06 ENCOUNTER — Ambulatory Visit (HOSPITAL_BASED_OUTPATIENT_CLINIC_OR_DEPARTMENT_OTHER): Payer: PPO

## 2016-12-06 ENCOUNTER — Other Ambulatory Visit: Payer: Self-pay | Admitting: *Deleted

## 2016-12-06 VITALS — BP 128/80 | HR 81 | Temp 98.0°F | Resp 20

## 2016-12-06 DIAGNOSIS — Z5111 Encounter for antineoplastic chemotherapy: Secondary | ICD-10-CM

## 2016-12-06 DIAGNOSIS — C772 Secondary and unspecified malignant neoplasm of intra-abdominal lymph nodes: Secondary | ICD-10-CM

## 2016-12-06 DIAGNOSIS — C679 Malignant neoplasm of bladder, unspecified: Secondary | ICD-10-CM | POA: Diagnosis not present

## 2016-12-06 LAB — CBC WITH DIFFERENTIAL (CANCER CENTER ONLY)
BASO#: 0 10*3/uL (ref 0.0–0.2)
BASO%: 0.1 % (ref 0.0–2.0)
EOS%: 0.7 % (ref 0.0–7.0)
Eosinophils Absolute: 0.1 10*3/uL (ref 0.0–0.5)
HCT: 36.5 % — ABNORMAL LOW (ref 38.7–49.9)
HGB: 12.7 g/dL — ABNORMAL LOW (ref 13.0–17.1)
LYMPH#: 4.2 10*3/uL — ABNORMAL HIGH (ref 0.9–3.3)
LYMPH%: 55.2 % — ABNORMAL HIGH (ref 14.0–48.0)
MCH: 29.2 pg (ref 28.0–33.4)
MCHC: 34.8 g/dL (ref 32.0–35.9)
MCV: 84 fL (ref 82–98)
MONO#: 0.2 10*3/uL (ref 0.1–0.9)
MONO%: 3.1 % (ref 0.0–13.0)
NEUT#: 3.1 10*3/uL (ref 1.5–6.5)
NEUT%: 40.9 % (ref 40.0–80.0)
Platelets: 349 10*3/uL (ref 145–400)
RBC: 4.35 10*6/uL (ref 4.20–5.70)
RDW: 14.4 % (ref 11.1–15.7)
WBC: 7.6 10*3/uL (ref 4.0–10.0)

## 2016-12-06 LAB — CMP (CANCER CENTER ONLY)
ALT(SGPT): 33 U/L (ref 10–47)
AST: 28 U/L (ref 11–38)
Albumin: 3.5 g/dL (ref 3.3–5.5)
Alkaline Phosphatase: 68 U/L (ref 26–84)
BUN, Bld: 24 mg/dL — ABNORMAL HIGH (ref 7–22)
CO2: 33 mEq/L (ref 18–33)
Calcium: 9.4 mg/dL (ref 8.0–10.3)
Chloride: 96 mEq/L — ABNORMAL LOW (ref 98–108)
Creat: 1.4 mg/dl — ABNORMAL HIGH (ref 0.6–1.2)
Glucose, Bld: 166 mg/dL — ABNORMAL HIGH (ref 73–118)
Potassium: 3.5 mEq/L (ref 3.3–4.7)
Sodium: 138 mEq/L (ref 128–145)
Total Bilirubin: 0.8 mg/dl (ref 0.20–1.60)
Total Protein: 6.3 g/dL — ABNORMAL LOW (ref 6.4–8.1)

## 2016-12-06 MED ORDER — SODIUM CHLORIDE 0.9 % IV SOLN
Freq: Once | INTRAVENOUS | Status: AC
  Administered 2016-12-06: 09:00:00 via INTRAVENOUS

## 2016-12-06 MED ORDER — SODIUM CHLORIDE 0.9 % IV SOLN
2600.0000 mg | Freq: Once | INTRAVENOUS | Status: AC
  Administered 2016-12-06: 2600 mg via INTRAVENOUS
  Filled 2016-12-06: qty 52.6

## 2016-12-06 MED ORDER — HEPARIN SOD (PORK) LOCK FLUSH 100 UNIT/ML IV SOLN
500.0000 [IU] | Freq: Once | INTRAVENOUS | Status: AC | PRN
  Administered 2016-12-06: 500 [IU]
  Filled 2016-12-06: qty 5

## 2016-12-06 MED ORDER — PROCHLORPERAZINE MALEATE 10 MG PO TABS
ORAL_TABLET | ORAL | Status: AC
  Filled 2016-12-06: qty 1

## 2016-12-06 MED ORDER — PROCHLORPERAZINE MALEATE 10 MG PO TABS
10.0000 mg | ORAL_TABLET | Freq: Once | ORAL | Status: AC
  Administered 2016-12-06: 10 mg via ORAL

## 2016-12-06 MED ORDER — SODIUM CHLORIDE 0.9% FLUSH
10.0000 mL | INTRAVENOUS | Status: DC | PRN
  Administered 2016-12-06: 10 mL
  Filled 2016-12-06: qty 10

## 2016-12-06 NOTE — Progress Notes (Signed)
OK to treat with CBC and CMET results from today per [name].  Patient to receive additional 250 ml NS with treatment today per [name].

## 2016-12-06 NOTE — Patient Instructions (Signed)
[facility] Discharge Instructions for Patients Receiving Chemotherapy  Today you received the following chemotherapy agents:  Gemzar  To help prevent nausea and vomiting after your treatment, we encourage you to take your nausea medication as ordered per MD.   If you develop nausea and vomiting that is not controlled by your nausea medication, call the clinic.   BELOW ARE SYMPTOMS THAT SHOULD BE REPORTED IMMEDIATELY:  *FEVER GREATER THAN 100.5 F  *CHILLS WITH OR WITHOUT FEVER  NAUSEA AND VOMITING THAT IS NOT CONTROLLED WITH YOUR NAUSEA MEDICATION  *UNUSUAL SHORTNESS OF BREATH  *UNUSUAL BRUISING OR BLEEDING  TENDERNESS IN MOUTH AND THROAT WITH OR WITHOUT PRESENCE OF ULCERS  *URINARY PROBLEMS  *BOWEL PROBLEMS  UNUSUAL RASH Items with * indicate a potential emergency and should be followed up as soon as possible.  Feel free to call the clinic you have any questions or concerns. The clinic phone number is [phone].  Please show the [facility] [facility] at check-in to the Emergency Department and triage nurse.

## 2016-12-11 ENCOUNTER — Encounter (HOSPITAL_COMMUNITY)
Admission: RE | Admit: 2016-12-11 | Discharge: 2016-12-11 | Disposition: A | Discharge status: Home / Self Care | Payer: PPO | Source: Ambulatory Visit | Attending: Hematology & Oncology | Admitting: Hematology & Oncology

## 2016-12-11 DIAGNOSIS — C679 Malignant neoplasm of bladder, unspecified: Secondary | ICD-10-CM | POA: Insufficient documentation

## 2016-12-11 DIAGNOSIS — C770 Secondary and unspecified malignant neoplasm of lymph nodes of head, face and neck: Secondary | ICD-10-CM | POA: Diagnosis not present

## 2016-12-11 DIAGNOSIS — C772 Secondary and unspecified malignant neoplasm of intra-abdominal lymph nodes: Secondary | ICD-10-CM | POA: Diagnosis not present

## 2016-12-11 LAB — GLUCOSE, CAPILLARY: Glucose-Capillary: 116 mg/dL — ABNORMAL HIGH (ref 65–99)

## 2016-12-11 MED ORDER — FLUDEOXYGLUCOSE F - 18 (FDG) INJECTION
14.3000 | Freq: Once | INTRAVENOUS | Status: AC | PRN
  Administered 2016-12-11: 14.3 via INTRAVENOUS

## 2016-12-20 ENCOUNTER — Other Ambulatory Visit (HOSPITAL_BASED_OUTPATIENT_CLINIC_OR_DEPARTMENT_OTHER): Payer: PPO

## 2016-12-20 ENCOUNTER — Ambulatory Visit (HOSPITAL_BASED_OUTPATIENT_CLINIC_OR_DEPARTMENT_OTHER): Payer: PPO

## 2016-12-20 ENCOUNTER — Ambulatory Visit: Payer: PPO

## 2016-12-20 ENCOUNTER — Ambulatory Visit (HOSPITAL_BASED_OUTPATIENT_CLINIC_OR_DEPARTMENT_OTHER): Payer: PPO | Admitting: Hematology & Oncology

## 2016-12-20 VITALS — BP 133/76 | HR 76 | Temp 97.1°F | Wt 266.0 lb

## 2016-12-20 DIAGNOSIS — C679 Malignant neoplasm of bladder, unspecified: Secondary | ICD-10-CM

## 2016-12-20 DIAGNOSIS — C772 Secondary and unspecified malignant neoplasm of intra-abdominal lymph nodes: Secondary | ICD-10-CM

## 2016-12-20 DIAGNOSIS — Z5111 Encounter for antineoplastic chemotherapy: Secondary | ICD-10-CM

## 2016-12-20 LAB — CMP (CANCER CENTER ONLY)
ALT(SGPT): 21 U/L (ref 10–47)
AST: 25 U/L (ref 11–38)
Albumin: 3.4 g/dL (ref 3.3–5.5)
Alkaline Phosphatase: 60 U/L (ref 26–84)
BUN, Bld: 13 mg/dL (ref 7–22)
CO2: 31 mEq/L (ref 18–33)
Calcium: 9.1 mg/dL (ref 8.0–10.3)
Chloride: 103 mEq/L (ref 98–108)
Creat: 1 mg/dl (ref 0.6–1.2)
Glucose, Bld: 140 mg/dL — ABNORMAL HIGH (ref 73–118)
Potassium: 3.9 mEq/L (ref 3.3–4.7)
Sodium: 142 mEq/L (ref 128–145)
Total Bilirubin: 0.6 mg/dl (ref 0.20–1.60)
Total Protein: 6.1 g/dL — ABNORMAL LOW (ref 6.4–8.1)

## 2016-12-20 LAB — CBC WITH DIFFERENTIAL (CANCER CENTER ONLY)
BASO#: 0 10*3/uL (ref 0.0–0.2)
BASO%: 0.3 % (ref 0.0–2.0)
EOS%: 1.9 % (ref 0.0–7.0)
Eosinophils Absolute: 0.1 10*3/uL (ref 0.0–0.5)
HCT: 35 % — ABNORMAL LOW (ref 38.7–49.9)
HGB: 12 g/dL — ABNORMAL LOW (ref 13.0–17.1)
LYMPH#: 3 10*3/uL (ref 0.9–3.3)
LYMPH%: 50.4 % — ABNORMAL HIGH (ref 14.0–48.0)
MCH: 29.9 pg (ref 28.0–33.4)
MCHC: 34.3 g/dL (ref 32.0–35.9)
MCV: 87 fL (ref 82–98)
MONO#: 0.6 10*3/uL (ref 0.1–0.9)
MONO%: 10.5 % (ref 0.0–13.0)
NEUT#: 2.2 10*3/uL (ref 1.5–6.5)
NEUT%: 36.9 % — ABNORMAL LOW (ref 40.0–80.0)
Platelets: 186 10*3/uL (ref 145–400)
RBC: 4.02 10*6/uL — ABNORMAL LOW (ref 4.20–5.70)
RDW: 16.3 % — ABNORMAL HIGH (ref 11.1–15.7)
WBC: 5.9 10*3/uL (ref 4.0–10.0)

## 2016-12-20 LAB — LACTATE DEHYDROGENASE: LDH: 248 U/L — ABNORMAL HIGH (ref 125–245)

## 2016-12-20 MED ORDER — POTASSIUM CHLORIDE 2 MEQ/ML IV SOLN
Freq: Once | INTRAVENOUS | Status: AC
  Administered 2016-12-20: 10:00:00 via INTRAVENOUS
  Filled 2016-12-20: qty 10

## 2016-12-20 MED ORDER — PALONOSETRON HCL INJECTION 0.25 MG/5ML
0.2500 mg | Freq: Once | INTRAVENOUS | Status: AC
  Administered 2016-12-20: 0.25 mg via INTRAVENOUS

## 2016-12-20 MED ORDER — FOSAPREPITANT DIMEGLUMINE INJECTION 150 MG
Freq: Once | INTRAVENOUS | Status: AC
  Administered 2016-12-20: 12:00:00 via INTRAVENOUS
  Filled 2016-12-20: qty 5

## 2016-12-20 MED ORDER — PALONOSETRON HCL INJECTION 0.25 MG/5ML
INTRAVENOUS | Status: AC
  Filled 2016-12-20: qty 5

## 2016-12-20 MED ORDER — SODIUM CHLORIDE 0.9 % IV SOLN
70.0000 mg/m2 | Freq: Once | INTRAVENOUS | Status: AC
  Administered 2016-12-20: 176 mg via INTRAVENOUS
  Filled 2016-12-20: qty 176

## 2016-12-20 MED ORDER — SODIUM CHLORIDE 0.9 % IV SOLN
2600.0000 mg | Freq: Once | INTRAVENOUS | Status: AC
  Administered 2016-12-20: 2600 mg via INTRAVENOUS
  Filled 2016-12-20: qty 15.78

## 2016-12-20 MED ORDER — SODIUM CHLORIDE 0.9 % IV SOLN
Freq: Once | INTRAVENOUS | Status: AC
  Administered 2016-12-20: 09:00:00 via INTRAVENOUS

## 2016-12-20 MED ORDER — HEPARIN SOD (PORK) LOCK FLUSH 100 UNIT/ML IV SOLN
500.0000 [IU] | Freq: Once | INTRAVENOUS | Status: AC | PRN
  Administered 2016-12-20: 500 [IU]
  Filled 2016-12-20: qty 5

## 2016-12-20 MED ORDER — SODIUM CHLORIDE 0.9% FLUSH
10.0000 mL | INTRAVENOUS | Status: DC | PRN
  Administered 2016-12-20: 10 mL
  Filled 2016-12-20: qty 10

## 2016-12-20 NOTE — Patient Instructions (Signed)
[place] Discharge Instructions for Patients Receiving Chemotherapy  Today you received the following chemotherapy agents Gemzar and Cisplatin.  To help prevent nausea and vomiting after your treatment, we encourage you to take your nausea medication as directed. NO ZOFRAN FOR 3 DAYS -TAKE COMPAZINE DURING THAT TIME.  IF THOSE MEDICATIONS ARE NOT WORKING THEN YOU CAN TRY THE ATIVAN FOR PERSISTENT CRAMPING/NAUSEA OR VOMITING.  TAKE YOUR DECADRON AS PRESCRIBED ON BOTTLE.   If you develop nausea and vomiting that is not controlled by your nausea medication, call the clinic.   BELOW ARE SYMPTOMS THAT SHOULD BE REPORTED IMMEDIATELY:  *FEVER GREATER THAN 100.5 F  *CHILLS WITH OR WITHOUT FEVER  NAUSEA AND VOMITING THAT IS NOT CONTROLLED WITH YOUR NAUSEA MEDICATION  *UNUSUAL SHORTNESS OF BREATH  *UNUSUAL BRUISING OR BLEEDING  TENDERNESS IN MOUTH AND THROAT WITH OR WITHOUT PRESENCE OF ULCERS  *URINARY PROBLEMS  *BOWEL PROBLEMS  UNUSUAL RASH Items with * indicate a potential emergency and should be followed up as soon as possible.  Feel free to call the clinic you have any questions or concerns. The clinic phone number is [phone].  Please show the [place] at check-in to the Emergency Department and triage nurse.          Gemcitabine injection What is this medicine? GEMCITABINE (jem SIT a been) is a chemotherapy drug. This medicine is used to treat many types of cancer like breast cancer, lung cancer, pancreatic cancer, and ovarian cancer. This medicine may be used for other purposes; ask your health care provider or pharmacist if you have questions. COMMON BRAND NAME(S): Gemzar What should I tell my health care provider before I take this medicine? They need to know if you have any of these conditions: -blood disorders -infection -kidney disease -liver disease -recent or ongoing radiation therapy -an unusual or allergic reaction to  gemcitabine, other chemotherapy, other medicines, foods, dyes, or preservatives -pregnant or trying to get pregnant -breast-feeding How should I use this medicine? This drug is given as an infusion into a vein. It is administered in a hospital or clinic by a specially trained health care professional. Talk to your pediatrician regarding the use of this medicine in children. Special care may be needed. Overdosage: If you think you have taken too much of this medicine contact a poison control center or emergency room at once. NOTE: This medicine is only for you. Do not share this medicine with others. What if I miss a dose? It is important not to miss your dose. Call your doctor or health care professional if you are unable to keep an appointment. What may interact with this medicine? -medicines to increase blood counts like filgrastim, pegfilgrastim, sargramostim -some other chemotherapy drugs like cisplatin -vaccines Talk to your doctor or health care professional before taking any of these medicines: -acetaminophen -aspirin -ibuprofen -ketoprofen -naproxen This list may not describe all possible interactions. Give your health care provider a list of all the medicines, herbs, non-prescription drugs, or dietary supplements you use. Also tell them if you smoke, drink alcohol, or use illegal drugs. Some items may interact with your medicine. What should I watch for while using this medicine? Visit your doctor for checks on your progress. This drug may make you feel generally unwell. This is not uncommon, as chemotherapy can affect healthy cells as well as cancer cells. Report any side effects. Continue your course of treatment even though you feel ill unless your doctor tells you to stop.  In some cases, you may be given additional medicines to help with side effects. Follow all directions for their use. Call your doctor or health care professional for advice if you get a fever, chills or sore  throat, or other symptoms of a cold or flu. Do not treat yourself. This drug decreases your body's ability to fight infections. Try to avoid being around people who are sick. This medicine may increase your risk to bruise or bleed. Call your doctor or health care professional if you notice any unusual bleeding. Be careful brushing and flossing your teeth or using a toothpick because you may get an infection or bleed more easily. If you have any dental work done, tell your dentist you are receiving this medicine. Avoid taking products that contain aspirin, acetaminophen, ibuprofen, naproxen, or ketoprofen unless instructed by your doctor. These medicines may hide a fever. Women should inform their doctor if they wish to become pregnant or think they might be pregnant. There is a potential for serious side effects to an unborn child. Talk to your health care professional or pharmacist for more information. Do not breast-feed an infant while taking this medicine. What side effects may I notice from receiving this medicine? Side effects that you should report to your doctor or health care professional as soon as possible: -allergic reactions like skin rash, itching or hives, swelling of the face, lips, or tongue -low blood counts - this medicine may decrease the number of white blood cells, red blood cells and platelets. You may be at increased risk for infections and bleeding. -signs of infection - fever or chills, cough, sore throat, pain or difficulty passing urine -signs of decreased platelets or bleeding - bruising, pinpoint red spots on the skin, black, tarry stools, blood in the urine -signs of decreased red blood cells - unusually weak or tired, fainting spells, lightheadedness -breathing problems -chest pain -mouth sores -nausea and vomiting -pain, swelling, redness at site where injected -pain, tingling, numbness in the hands or feet -stomach pain -swelling of ankles, feet, hands -unusual  bleeding Side effects that usually do not require medical attention (report to your doctor or health care professional if they continue or are bothersome): -constipation -diarrhea -hair loss -loss of appetite -stomach upset This list may not describe all possible side effects. Call your doctor for medical advice about side effects. You may report side effects to FDA at 1-800-FDA-1088. Where should I keep my medicine? This drug is given in a hospital or clinic and will not be stored at home. NOTE: This sheet is a summary. It may not cover all possible information. If you have questions about this medicine, talk to your doctor, pharmacist, or health care provider.  2018 Elsevier/Gold Standard (2007-08-08 18:45:54)   Cisplatin injection What is this medicine? CISPLATIN (SIS pla tin) is a chemotherapy drug. It targets fast dividing cells, like cancer cells, and causes these cells to die. This medicine is used to treat many types of cancer like bladder, ovarian, and testicular cancers. This medicine may be used for other purposes; ask your health care provider or pharmacist if you have questions. COMMON BRAND NAME(S): Platinol, Platinol -AQ What should I tell my health care provider before I take this medicine? They need to know if you have any of these conditions: -blood disorders -hearing problems -kidney disease -recent or ongoing radiation therapy -an unusual or allergic reaction to cisplatin, carboplatin, other chemotherapy, other medicines, foods, dyes, or preservatives -pregnant or trying to get pregnant -breast-feeding How  should I use this medicine? This drug is given as an infusion into a vein. It is administered in a hospital or clinic by a specially trained health care professional. Talk to your pediatrician regarding the use of this medicine in children. Special care may be needed. Overdosage: If you think you have taken too much of this medicine contact a poison control  center or emergency room at once. NOTE: This medicine is only for you. Do not share this medicine with others. What if I miss a dose? It is important not to miss a dose. Call your doctor or health care professional if you are unable to keep an appointment. What may interact with this medicine? -dofetilide -foscarnet -medicines for seizures -medicines to increase blood counts like filgrastim, pegfilgrastim, sargramostim -probenecid -pyridoxine used with altretamine -rituximab -some antibiotics like amikacin, gentamicin, neomycin, polymyxin B, streptomycin, tobramycin -sulfinpyrazone -vaccines -zalcitabine Talk to your doctor or health care professional before taking any of these medicines: -acetaminophen -aspirin -ibuprofen -ketoprofen -naproxen This list may not describe all possible interactions. Give your health care provider a list of all the medicines, herbs, non-prescription drugs, or dietary supplements you use. Also tell them if you smoke, drink alcohol, or use illegal drugs. Some items may interact with your medicine. What should I watch for while using this medicine? Your condition will be monitored carefully while you are receiving this medicine. You will need important blood work done while you are taking this medicine. This drug may make you feel generally unwell. This is not uncommon, as chemotherapy can affect healthy cells as well as cancer cells. Report any side effects. Continue your course of treatment even though you feel ill unless your doctor tells you to stop. In some cases, you may be given additional medicines to help with side effects. Follow all directions for their use. Call your doctor or health care professional for advice if you get a fever, chills or sore throat, or other symptoms of a cold or flu. Do not treat yourself. This drug decreases your body's ability to fight infections. Try to avoid being around people who are sick. This medicine may increase your  risk to bruise or bleed. Call your doctor or health care professional if you notice any unusual bleeding. Be careful brushing and flossing your teeth or using a toothpick because you may get an infection or bleed more easily. If you have any dental work done, tell your dentist you are receiving this medicine. Avoid taking products that contain aspirin, acetaminophen, ibuprofen, naproxen, or ketoprofen unless instructed by your doctor. These medicines may hide a fever. Do not become pregnant while taking this medicine. Women should inform their doctor if they wish to become pregnant or think they might be pregnant. There is a potential for serious side effects to an unborn child. Talk to your health care professional or pharmacist for more information. Do not breast-feed an infant while taking this medicine. Drink fluids as directed while you are taking this medicine. This will help protect your kidneys. Call your doctor or health care professional if you get diarrhea. Do not treat yourself. What side effects may I notice from receiving this medicine? Side effects that you should report to your doctor or health care professional as soon as possible: -allergic reactions like skin rash, itching or hives, swelling of the face, lips, or tongue -signs of infection - fever or chills, cough, sore throat, pain or difficulty passing urine -signs of decreased platelets or bleeding - bruising, pinpoint red  spots on the skin, black, tarry stools, nosebleeds -signs of decreased red blood cells - unusually weak or tired, fainting spells, lightheadedness -breathing problems -changes in hearing -gout pain -low blood counts - This drug may decrease the number of white blood cells, red blood cells and platelets. You may be at increased risk for infections and bleeding. -nausea and vomiting -pain, swelling, redness or irritation at the injection site -pain, tingling, numbness in the hands or feet -problems with  balance, movement -trouble passing urine or change in the amount of urine Side effects that usually do not require medical attention (report to your doctor or health care professional if they continue or are bothersome): -changes in vision -loss of appetite -metallic taste in the mouth or changes in taste This list may not describe all possible side effects. Call your doctor for medical advice about side effects. You may report side effects to FDA at 1-800-FDA-1088. Where should I keep my medicine? This drug is given in a hospital or clinic and will not be stored at home. NOTE: This sheet is a summary. It may not cover all possible information. If you have questions about this medicine, talk to your doctor, pharmacist, or health care provider.  2018 Elsevier/Gold Standard (2007-07-04 14:40:54)

## 2016-12-20 NOTE — Progress Notes (Signed)
Hematology and Oncology Follow Up Visit  [name] Rose [id number] April 25, 1950 66 y.o. 12/20/2016   Principle Diagnosis:   Metastatic high-grade bladder cancer  Current Therapy:   Status post cycle #3 of cisplatin/gemcitabine-      Interim History:  [name] Rose is back for follow-up. He is doing quite well. We did do a PET scan on him. Actually, the PET scan showed that he has had a fantastic response to treatment. I reviewed the PET scan with he and his wife. I showed them the actual pictures. He had marked improvement with resolution of adenopathy in his neck, chest, abdomen and pelvis. Previously enlarged and hypermetabolic lymph nodes are no longer noted. There is no new areas of disease.  I'm so happy for him. After that first cycle of treatment in which he had that Listeriosis infection, he really has not nicely.  He is still playing golf. He is still quite active. He and his [place] and had a wonderful time.  He's had no problems with pain. He is constipated. He will start MiraLAX.  He's had no cough. He's had no bleeding. He's had no leg swelling.  He's had no rashes.  Overall, his performance status is ECOG 0.  .  Medications:  Current Outpatient Prescriptions:  .  amLODipine (NORVASC) 10 MG tablet, Take 10 mg by mouth every morning., Disp: , Rfl:  .  bisoprolol-hydrochlorothiazide (ZIAC) 10-6.25 MG tablet, Take 1 tablet by mouth every evening. , Disp: , Rfl:  .  Cinnamon 500 MG TABS, Take 1 tablet by mouth 2 (two) times daily. , Disp: , Rfl:  .  dexamethasone (DECADRON) 4 MG tablet, Take 2 tablets by mouth once a day on the day after cisplatin chemotherapy and then take 2 tablets two times a day for 2 days. Take with food., Disp: 30 tablet, Rfl: 1 .  lidocaine-prilocaine (EMLA) cream, Apply to affected area once (Patient taking differently: Apply 1 application topically once. Port), Disp: 30 g, Rfl: 3 .  LORazepam  (ATIVAN) 0.5 MG tablet, Take 1 tablet (0.5 mg total) by mouth every 6 (six) hours as needed (Nausea or vomiting)., Disp: 30 tablet, Rfl: 0 .  losartan (COZAAR) 100 MG tablet, Take 100 mg by mouth every morning. , Disp: , Rfl:  .  MAGNESIUM PO, Take 400 mg by mouth at bedtime. Leg cramps, Disp: , Rfl:  .  Naproxen Sodium (ALEVE) 220 MG CAPS, Take 220 mg by mouth 2 (two) times daily. , Disp: , Rfl:  .  ondansetron (ZOFRAN) 8 MG tablet, Take 1 tablet (8 mg total) by mouth 2 (two) times daily as needed. Start on the third day after cisplatin chemotherapy., Disp: 30 tablet, Rfl: 1 .  prochlorperazine (COMPAZINE) 10 MG tablet, Take 1 tablet (10 mg total) by mouth every 6 (six) hours as needed (Nausea or vomiting)., Disp: 30 tablet, Rfl: 1 .  potassium chloride SA (K-DUR,KLOR-CON) 20 MEQ tablet, Take 1 tablet (20 mEq total) by mouth daily., Disp: 5 tablet, Rfl: 0  Allergies: No Known Allergies  Past Medical History, Surgical history, Social history, and Family History were reviewed and updated.  Review of Systems:As stated in the interim history  Physical Exam:  weight is 266 lb (120.7 kg). His oral temperature is 97.1 F (36.2 C) (abnormal). His blood pressure is 133/76 and his pulse is 76.   Wt Readings from Last 3 Encounters:  12/20/16 266 lb (120.7 kg)  11/29/16 265 lb (  120.2 kg)  11/15/16 253 lb (114.8 kg)     Physical Exam  Constitutional: He is oriented to person, place, and time.  HENT:  Head: Normocephalic and atraumatic.  Mouth/Throat: Oropharynx is clear and moist.  Eyes: Pupils are equal, round, and reactive to light. EOM are normal.  Neck: Normal range of motion.  Cardiovascular: Normal rate, regular rhythm and normal heart sounds.   Pulmonary/Chest: Effort normal and breath sounds normal.  Abdominal: Soft. Bowel sounds are normal.  Musculoskeletal: Normal range of motion. He exhibits no edema, tenderness or deformity.  Lymphadenopathy:    He has no cervical adenopathy.    Neurological: He is alert and oriented to person, place, and time.  Skin: Skin is warm and dry. No rash noted. No erythema.  Psychiatric: He has a normal mood and affect. His behavior is normal. Judgment and thought content normal.  Vitals reviewed.    Lab Results  Component Value Date   WBC 5.9 12/20/2016   HGB 12.0 (L) 12/20/2016   HCT 35.0 (L) 12/20/2016   MCV 87 12/20/2016   PLT 186 12/20/2016     Chemistry      Component Value Date/Time   NA 142 12/20/2016 0800   K 3.9 12/20/2016 0800   CL 103 12/20/2016 0800   CO2 31 12/20/2016 0800   BUN 13 12/20/2016 0800   CREATININE 1.0 12/20/2016 0800      Component Value Date/Time   CALCIUM 9.1 12/20/2016 0800   ALKPHOS 60 12/20/2016 0800   AST 25 12/20/2016 0800   ALT 21 12/20/2016 0800   BILITOT 0.60 12/20/2016 0800         Impression and Plan: [name] Rose is a 66 year old white male. He has done incredibly well with treatment. He has had almost complete resolution of his metastatic disease.  For right now, we will continue him on therapy. I will do 3 more cycles of chemotherapy and that we might have to think about some kind of "maintenance" therapy for him.  I spent about 30 minutes with he and his wife. We talked about his response. Talked about our plans for future therapy. He and his wife agree. I answered their questions.   We'll plan to get him back in another 3 weeks.   Volanda Napoleon, MD 9/10/20189:04 AM

## 2016-12-20 NOTE — Progress Notes (Signed)
Per MD Ennever ok to proceed with chemo today with only 17ml of urine output now.

## 2016-12-27 ENCOUNTER — Other Ambulatory Visit: Payer: Self-pay | Admitting: *Deleted

## 2016-12-27 ENCOUNTER — Ambulatory Visit (HOSPITAL_BASED_OUTPATIENT_CLINIC_OR_DEPARTMENT_OTHER): Payer: PPO

## 2016-12-27 ENCOUNTER — Ambulatory Visit: Payer: PPO

## 2016-12-27 VITALS — BP 117/78 | HR 83 | Temp 97.8°F | Resp 18

## 2016-12-27 DIAGNOSIS — C772 Secondary and unspecified malignant neoplasm of intra-abdominal lymph nodes: Secondary | ICD-10-CM

## 2016-12-27 DIAGNOSIS — Z5111 Encounter for antineoplastic chemotherapy: Secondary | ICD-10-CM

## 2016-12-27 DIAGNOSIS — C679 Malignant neoplasm of bladder, unspecified: Secondary | ICD-10-CM | POA: Diagnosis not present

## 2016-12-27 LAB — CBC WITH DIFFERENTIAL (CANCER CENTER ONLY)
BASO#: 0 10*3/uL (ref 0.0–0.2)
BASO%: 0 % (ref 0.0–2.0)
EOS%: 0.6 % (ref 0.0–7.0)
Eosinophils Absolute: 0 10*3/uL (ref 0.0–0.5)
HCT: 36.5 % — ABNORMAL LOW (ref 38.7–49.9)
HGB: 12.8 g/dL — ABNORMAL LOW (ref 13.0–17.1)
LYMPH#: 4 10*3/uL — ABNORMAL HIGH (ref 0.9–3.3)
LYMPH%: 63.1 % — ABNORMAL HIGH (ref 14.0–48.0)
MCH: 29.9 pg (ref 28.0–33.4)
MCHC: 35.1 g/dL (ref 32.0–35.9)
MCV: 85 fL (ref 82–98)
MONO#: 0.3 10*3/uL (ref 0.1–0.9)
MONO%: 4.2 % (ref 0.0–13.0)
NEUT#: 2 10*3/uL (ref 1.5–6.5)
NEUT%: 32.1 % — ABNORMAL LOW (ref 40.0–80.0)
Platelets: 314 10*3/uL (ref 145–400)
RBC: 4.28 10*6/uL (ref 4.20–5.70)
RDW: 15.6 % (ref 11.1–15.7)
WBC: 6.3 10*3/uL (ref 4.0–10.0)

## 2016-12-27 MED ORDER — PROCHLORPERAZINE MALEATE 10 MG PO TABS
ORAL_TABLET | ORAL | Status: AC
  Filled 2016-12-27: qty 1

## 2016-12-27 MED ORDER — PROCHLORPERAZINE MALEATE 10 MG PO TABS
10.0000 mg | ORAL_TABLET | Freq: Once | ORAL | Status: AC
  Administered 2016-12-27: 10 mg via ORAL

## 2016-12-27 MED ORDER — SODIUM CHLORIDE 0.9% FLUSH
10.0000 mL | INTRAVENOUS | Status: DC | PRN
  Administered 2016-12-27: 10 mL
  Filled 2016-12-27: qty 10

## 2016-12-27 MED ORDER — SODIUM CHLORIDE 0.9 % IV SOLN
2600.0000 mg | Freq: Once | INTRAVENOUS | Status: AC
  Administered 2016-12-27: 2600 mg via INTRAVENOUS
  Filled 2016-12-27: qty 52.6

## 2016-12-27 MED ORDER — HEPARIN SOD (PORK) LOCK FLUSH 100 UNIT/ML IV SOLN
500.0000 [IU] | Freq: Once | INTRAVENOUS | Status: AC | PRN
  Administered 2016-12-27: 500 [IU]
  Filled 2016-12-27: qty 5

## 2016-12-27 MED ORDER — LORAZEPAM 0.5 MG PO TABS: 0.5000 mg | ORAL_TABLET | Freq: Four times a day (QID) | ORAL | 0 refills | Status: DC | PRN

## 2016-12-27 MED ORDER — SODIUM CHLORIDE 0.9 % IV SOLN
Freq: Once | INTRAVENOUS | Status: AC
  Administered 2016-12-27: 09:00:00 via INTRAVENOUS

## 2016-12-27 NOTE — Patient Instructions (Addendum)
[facility] Discharge Instructions for Patients Receiving Chemotherapy  Today you received the following chemotherapy agents:  Gemzar  To help prevent nausea and vomiting after your treatment, we encourage you to take your nausea medication as ordered per MD.    If you develop nausea and vomiting that is not controlled by your nausea medication, call the clinic.   BELOW ARE SYMPTOMS THAT SHOULD BE REPORTED IMMEDIATELY:  *FEVER GREATER THAN 100.5 F  *CHILLS WITH OR WITHOUT FEVER  NAUSEA AND VOMITING THAT IS NOT CONTROLLED WITH YOUR NAUSEA MEDICATION  *UNUSUAL SHORTNESS OF BREATH  *UNUSUAL BRUISING OR BLEEDING  TENDERNESS IN MOUTH AND THROAT WITH OR WITHOUT PRESENCE OF ULCERS  *URINARY PROBLEMS  *BOWEL PROBLEMS  UNUSUAL RASH Items with * indicate a potential emergency and should be followed up as soon as possible.  Feel free to call the clinic you have any questions or concerns. The clinic phone number is [phone].  Please show the [place] at check-in to the Emergency Department and triage nurse.  Gemcitabine injection What is this medicine? GEMCITABINE (jem SIT a been) is a chemotherapy drug. This medicine is used to treat many types of cancer like breast cancer, lung cancer, pancreatic cancer, and ovarian cancer. This medicine may be used for other purposes; ask your health care provider or pharmacist if you have questions. COMMON BRAND NAME(S): Gemzar What should I tell my health care provider before I take this medicine? They need to know if you have any of these conditions: -blood disorders -infection -kidney disease -liver disease -recent or ongoing radiation therapy -an unusual or allergic reaction to gemcitabine, other chemotherapy, other medicines, foods, dyes, or preservatives -pregnant or trying to get pregnant -breast-feeding How should I use this medicine? This drug is given as an infusion into a vein. It is administered in a  hospital or clinic by a specially trained health care professional. Talk to your pediatrician regarding the use of this medicine in children. Special care may be needed. Overdosage: If you think you have taken too much of this medicine contact a poison control center or emergency room at once. NOTE: This medicine is only for you. Do not share this medicine with others. What if I miss a dose? It is important not to miss your dose. Call your doctor or health care professional if you are unable to keep an appointment. What may interact with this medicine? -medicines to increase blood counts like filgrastim, pegfilgrastim, sargramostim -some other chemotherapy drugs like cisplatin -vaccines Talk to your doctor or health care professional before taking any of these medicines: -acetaminophen -aspirin -ibuprofen -ketoprofen -naproxen This list may not describe all possible interactions. Give your health care provider a list of all the medicines, herbs, non-prescription drugs, or dietary supplements you use. Also tell them if you smoke, drink alcohol, or use illegal drugs. Some items may interact with your medicine. What should I watch for while using this medicine? Visit your doctor for checks on your progress. This drug may make you feel generally unwell. This is not uncommon, as chemotherapy can affect healthy cells as well as cancer cells. Report any side effects. Continue your course of treatment even though you feel ill unless your doctor tells you to stop. In some cases, you may be given additional medicines to help with side effects. Follow all directions for their use. Call your doctor or health care professional for advice if you get a fever, chills or sore throat, or other symptoms of  a cold or flu. Do not treat yourself. This drug decreases your body's ability to fight infections. Try to avoid being around people who are sick. This medicine may increase your risk to bruise or bleed. Call  your doctor or health care professional if you notice any unusual bleeding. Be careful brushing and flossing your teeth or using a toothpick because you may get an infection or bleed more easily. If you have any dental work done, tell your dentist you are receiving this medicine. Avoid taking products that contain aspirin, acetaminophen, ibuprofen, naproxen, or ketoprofen unless instructed by your doctor. These medicines may hide a fever. Women should inform their doctor if they wish to become pregnant or think they might be pregnant. There is a potential for serious side effects to an unborn child. Talk to your health care professional or pharmacist for more information. Do not breast-feed an infant while taking this medicine. What side effects may I notice from receiving this medicine? Side effects that you should report to your doctor or health care professional as soon as possible: -allergic reactions like skin rash, itching or hives, swelling of the face, lips, or tongue -low blood counts - this medicine may decrease the number of white blood cells, red blood cells and platelets. You may be at increased risk for infections and bleeding. -signs of infection - fever or chills, cough, sore throat, pain or difficulty passing urine -signs of decreased platelets or bleeding - bruising, pinpoint red spots on the skin, black, tarry stools, blood in the urine -signs of decreased red blood cells - unusually weak or tired, fainting spells, lightheadedness -breathing problems -chest pain -mouth sores -nausea and vomiting -pain, swelling, redness at site where injected -pain, tingling, numbness in the hands or feet -stomach pain -swelling of ankles, feet, hands -unusual bleeding Side effects that usually do not require medical attention (report to your doctor or health care professional if they continue or are bothersome): -constipation -diarrhea -hair loss -loss of appetite -stomach upset This  list may not describe all possible side effects. Call your doctor for medical advice about side effects. You may report side effects to FDA at 1-800-FDA-1088. Where should I keep my medicine? This drug is given in a hospital or clinic and will not be stored at home. NOTE: This sheet is a summary. It may not cover all possible information. If you have questions about this medicine, talk to your doctor, pharmacist, or health care provider.  2018 Elsevier/Gold Standard (2007-08-08 18:45:54)

## 2016-12-31 ENCOUNTER — Other Ambulatory Visit: Payer: Self-pay | Admitting: Hematology & Oncology

## 2016-12-31 DIAGNOSIS — C772 Secondary and unspecified malignant neoplasm of intra-abdominal lymph nodes: Secondary | ICD-10-CM

## 2016-12-31 DIAGNOSIS — C679 Malignant neoplasm of bladder, unspecified: Secondary | ICD-10-CM

## 2017-01-09 ENCOUNTER — Other Ambulatory Visit: Payer: Self-pay | Admitting: Hematology & Oncology

## 2017-01-09 DIAGNOSIS — C772 Secondary and unspecified malignant neoplasm of intra-abdominal lymph nodes: Secondary | ICD-10-CM

## 2017-01-09 DIAGNOSIS — C679 Malignant neoplasm of bladder, unspecified: Secondary | ICD-10-CM

## 2017-01-10 ENCOUNTER — Ambulatory Visit: Payer: PPO

## 2017-01-10 ENCOUNTER — Other Ambulatory Visit (HOSPITAL_BASED_OUTPATIENT_CLINIC_OR_DEPARTMENT_OTHER): Payer: PPO

## 2017-01-10 ENCOUNTER — Ambulatory Visit (HOSPITAL_BASED_OUTPATIENT_CLINIC_OR_DEPARTMENT_OTHER): Payer: PPO

## 2017-01-10 ENCOUNTER — Ambulatory Visit (HOSPITAL_BASED_OUTPATIENT_CLINIC_OR_DEPARTMENT_OTHER): Payer: PPO | Admitting: Family

## 2017-01-10 ENCOUNTER — Other Ambulatory Visit: Payer: Self-pay | Admitting: *Deleted

## 2017-01-10 DIAGNOSIS — C772 Secondary and unspecified malignant neoplasm of intra-abdominal lymph nodes: Secondary | ICD-10-CM

## 2017-01-10 DIAGNOSIS — M542 Cervicalgia: Secondary | ICD-10-CM | POA: Diagnosis not present

## 2017-01-10 DIAGNOSIS — Z5111 Encounter for antineoplastic chemotherapy: Secondary | ICD-10-CM

## 2017-01-10 DIAGNOSIS — K5909 Other constipation: Secondary | ICD-10-CM | POA: Diagnosis not present

## 2017-01-10 DIAGNOSIS — C679 Malignant neoplasm of bladder, unspecified: Secondary | ICD-10-CM

## 2017-01-10 LAB — CBC WITH DIFFERENTIAL (CANCER CENTER ONLY)
BASO#: 0 10*3/uL (ref 0.0–0.2)
BASO%: 0.4 % (ref 0.0–2.0)
EOS%: 1.8 % (ref 0.0–7.0)
Eosinophils Absolute: 0.1 10*3/uL (ref 0.0–0.5)
HCT: 31.9 % — ABNORMAL LOW (ref 38.7–49.9)
HGB: 10.8 g/dL — ABNORMAL LOW (ref 13.0–17.1)
LYMPH#: 2.5 10*3/uL (ref 0.9–3.3)
LYMPH%: 45.3 % (ref 14.0–48.0)
MCH: 30.5 pg (ref 28.0–33.4)
MCHC: 33.9 g/dL (ref 32.0–35.9)
MCV: 90 fL (ref 82–98)
MONO#: 0.7 10*3/uL (ref 0.1–0.9)
MONO%: 11.9 % (ref 0.0–13.0)
NEUT#: 2.3 10*3/uL (ref 1.5–6.5)
NEUT%: 40.6 % (ref 40.0–80.0)
Platelets: 163 10*3/uL (ref 145–400)
RBC: 3.54 10*6/uL — ABNORMAL LOW (ref 4.20–5.70)
RDW: 17.6 % — ABNORMAL HIGH (ref 11.1–15.7)
WBC: 5.6 10*3/uL (ref 4.0–10.0)

## 2017-01-10 LAB — CMP (CANCER CENTER ONLY)
ALT(SGPT): 19 U/L (ref 10–47)
AST: 26 U/L (ref 11–38)
Albumin: 3.3 g/dL (ref 3.3–5.5)
Alkaline Phosphatase: 57 U/L (ref 26–84)
BUN, Bld: 12 mg/dL (ref 7–22)
CO2: 31 mEq/L (ref 18–33)
Calcium: 8.8 mg/dL (ref 8.0–10.3)
Chloride: 100 mEq/L (ref 98–108)
Creat: 1.2 mg/dl (ref 0.6–1.2)
Glucose, Bld: 163 mg/dL — ABNORMAL HIGH (ref 73–118)
Potassium: 3.8 mEq/L (ref 3.3–4.7)
Sodium: 138 mEq/L (ref 128–145)
Total Bilirubin: 0.6 mg/dl (ref 0.20–1.60)
Total Protein: 5.7 g/dL — ABNORMAL LOW (ref 6.4–8.1)

## 2017-01-10 LAB — LACTATE DEHYDROGENASE: LDH: 264 U/L — ABNORMAL HIGH (ref 125–245)

## 2017-01-10 MED ORDER — ONDANSETRON HCL 8 MG PO TABS: ORAL_TABLET | ORAL | 1 refills | Status: DC

## 2017-01-10 MED ORDER — PALONOSETRON HCL INJECTION 0.25 MG/5ML
INTRAVENOUS | Status: AC
  Filled 2017-01-10: qty 5

## 2017-01-10 MED ORDER — HEPARIN SOD (PORK) LOCK FLUSH 100 UNIT/ML IV SOLN
500.0000 [IU] | Freq: Once | INTRAVENOUS | Status: AC | PRN
  Administered 2017-01-10: 500 [IU]
  Filled 2017-01-10: qty 5

## 2017-01-10 MED ORDER — PALONOSETRON HCL INJECTION 0.25 MG/5ML
0.2500 mg | Freq: Once | INTRAVENOUS | Status: AC
  Administered 2017-01-10: 0.25 mg via INTRAVENOUS

## 2017-01-10 MED ORDER — CISPLATIN CHEMO INJECTION 100MG/100ML
70.0000 mg/m2 | Freq: Once | INTRAVENOUS | Status: AC
  Administered 2017-01-10: 176 mg via INTRAVENOUS
  Filled 2017-01-10: qty 176

## 2017-01-10 MED ORDER — SODIUM CHLORIDE 0.9 % IV SOLN
Freq: Once | INTRAVENOUS | Status: AC
  Administered 2017-01-10: 10:00:00 via INTRAVENOUS

## 2017-01-10 MED ORDER — SODIUM CHLORIDE 0.9 % IV SOLN
2600.0000 mg | Freq: Once | INTRAVENOUS | Status: AC
  Administered 2017-01-10: 2600 mg via INTRAVENOUS
  Filled 2017-01-10: qty 52.6

## 2017-01-10 MED ORDER — POTASSIUM CHLORIDE 2 MEQ/ML IV SOLN
Freq: Once | INTRAVENOUS | Status: AC
  Administered 2017-01-10: 10:00:00 via INTRAVENOUS
  Filled 2017-01-10: qty 10

## 2017-01-10 MED ORDER — SODIUM CHLORIDE 0.9% FLUSH
10.0000 mL | INTRAVENOUS | Status: DC | PRN
  Administered 2017-01-10: 10 mL
  Filled 2017-01-10: qty 10

## 2017-01-10 MED ORDER — FOSAPREPITANT DIMEGLUMINE INJECTION 150 MG
Freq: Once | INTRAVENOUS | Status: AC
  Administered 2017-01-10: 12:00:00 via INTRAVENOUS
  Filled 2017-01-10: qty 5

## 2017-01-10 NOTE — Progress Notes (Signed)
Hematology and Oncology Follow Up Visit  [name] Rose [id number] 02-25-1951 66 y.o. 01/10/2017   Principle Diagnosis:  Metastatic high-grade bladder cancer  Current Therapy:   Cisplatin/Gemcitabine s/p 4    Interim History:  [name] Rose is here today for follow-up and treatment (cycle 5). He is doing well and states that he is staying active playing golf and plans to play later today and some more this week.  He has occasional nausea, no vomiting after treatment. He takes his zofran and ativan as needed which helps resolve his symptoms.  His taste is also effected after treatment which lessens his appetite. He still makes himself eat but admits that he needs to hydrate better. His weight is stable.  He has had no fever, chills, cough, rash, dizziness, SOB, chest pain, palpitations, abdominal pain or changes in bowel or bladder habits.  He has chronic constipation that he treats with stool softeners and Mirilax as needed.  No swelling in his extremities. He has issues with neck pain with numbness and tingling that radiates down his left arm off and on. He is followed by Dr. [name] for this and has been treated with an antiinflammatory in the past. He plans to follow-up with him today for a refill.    ECOG Performance Status: 1 - Symptomatic but completely ambulatory  Medications:  Allergies as of 01/10/2017   No Known Allergies     Medication List       Accurate as of 01/10/17  9:25 AM. Always use your most recent med list.          ALEVE 220 MG Caps Generic drug:  Naproxen Sodium Take 220 mg by mouth 2 (two) times daily.   amLODipine 10 MG tablet Commonly known as:  NORVASC Take 10 mg by mouth every morning.   bisoprolol-hydrochlorothiazide 10-6.25 MG tablet Commonly known as:  ZIAC Take 1 tablet by mouth every evening.   Cinnamon 500 MG Tabs Take 1 tablet by mouth 2 (two) times daily.   dexamethasone 4 MG tablet Commonly known as:  DECADRON Take 2 tablets by  mouth once a day on the day after cisplatin chemotherapy and then take 2 tablets two times a day for 2 days. Take with food.   lidocaine-prilocaine cream Commonly known as:  EMLA Apply to affected area once   LORazepam 0.5 MG tablet Commonly known as:  ATIVAN Take 1 tablet (0.5 mg total) by mouth every 6 (six) hours as needed (Nausea or vomiting).   MAGNESIUM PO Take 400 mg by mouth at bedtime. Leg cramps   ondansetron 8 MG tablet Commonly known as:  ZOFRAN TAKE 1 TABLET BY MOUTH TWICE A DAY START 3RD DAY AFTER CHEMO   potassium chloride SA 20 MEQ tablet Commonly known as:  K-DUR,KLOR-CON Take 1 tablet (20 mEq total) by mouth daily.   prochlorperazine 10 MG tablet Commonly known as:  COMPAZINE TAKE 1 TABLET (10 MG TOTAL) BY MOUTH EVERY 6 (SIX) HOURS AS NEEDED (NAUSEA OR VOMITING).       Allergies: No Known Allergies  Past Medical History, Surgical history, Social history, and Family History were reviewed and updated.  Review of Systems: All other 10 point review of systems is negative.   Physical Exam:  vitals were not taken for this visit.  Wt Readings from Last 3 Encounters:  12/20/16 266 lb (120.7 kg)  11/29/16 265 lb (120.2 kg)  11/15/16 253 lb (114.8 kg)    Ocular: Sclerae unicteric, pupils equal, round and reactive to  light Ear-nose-throat: Oropharynx clear, dentition fair Lymphatic: No cervical, supraclavicular or axillary adenopathy Lungs no rales or rhonchi, good excursion bilaterally Heart regular rate and rhythm, no murmur appreciated Abd soft, nontender, positive bowel sounds, no liver or spleen tip palpated on exam, no fluid wave MSK no focal spinal tenderness, no joint edema Neuro: non-focal, well-oriented, appropriate affect Breasts: Deferred   Lab Results  Component Value Date   WBC 5.6 01/10/2017   HGB 10.8 (L) 01/10/2017   HCT 31.9 (L) 01/10/2017   MCV 90 01/10/2017   PLT 163 01/10/2017   No results found for: FERRITIN, IRON, TIBC, UIBC,  IRONPCTSAT Lab Results  Component Value Date   RBC 3.54 (L) 01/10/2017   No results found for: KPAFRELGTCHN, LAMBDASER, KAPLAMBRATIO No results found for: IGGSERUM, IGA, IGMSERUM No results found for: [name], SPEI   Chemistry      Component Value Date/Time   NA 138 01/10/2017 0822   K 3.8 01/10/2017 0822   CL 100 01/10/2017 0822   CO2 31 01/10/2017 0822   BUN 12 01/10/2017 0822   CREATININE 1.2 01/10/2017 0822      Component Value Date/Time   CALCIUM 8.8 01/10/2017 0822   ALKPHOS 57 01/10/2017 0822   AST 26 01/10/2017 0822   ALT 19 01/10/2017 0822   BILITOT 0.60 01/10/2017 0822      Impression and Plan: [name] Rose is a very pleasant 66 yo caucasian gentleman with metastatic high grade bladder cancer. He has done well with treatment and has no complaints today. His symptom management with each cycle has been effective. We will proceed with cycle 5 today as planned.   We will plan to have him complete 6 cycles and then look at getting him onto something for maintenance.  He is in agreement with the plan and will follow up again with Dr. [name] regarding his neck pain.  He will contact our office with any questions or concerns and go to the ED in the event of an emergency.   Eliezer Bottom, NP 10/1/20189:25 AM

## 2017-01-10 NOTE — Patient Instructions (Signed)
[facility] Discharge Instructions for Patients Receiving Chemotherapy  Today you received the following chemotherapy agents:  Gemzar and Cisplatin  To help prevent nausea and vomiting after your treatment, we encourage you to take your nausea medication as ordered per MD.    If you develop nausea and vomiting that is not controlled by your nausea medication, call the clinic.   BELOW ARE SYMPTOMS THAT SHOULD BE REPORTED IMMEDIATELY:  *FEVER GREATER THAN 100.5 F  *CHILLS WITH OR WITHOUT FEVER  NAUSEA AND VOMITING THAT IS NOT CONTROLLED WITH YOUR NAUSEA MEDICATION  *UNUSUAL SHORTNESS OF BREATH  *UNUSUAL BRUISING OR BLEEDING  TENDERNESS IN MOUTH AND THROAT WITH OR WITHOUT PRESENCE OF ULCERS  *URINARY PROBLEMS  *BOWEL PROBLEMS  UNUSUAL RASH Items with * indicate a potential emergency and should be followed up as soon as possible.  Feel free to call the clinic should you have any questions or concerns. The clinic phone number is [phone].  Please show the [facility] at check-in to the Emergency Department and triage nurse.

## 2017-01-17 ENCOUNTER — Ambulatory Visit: Payer: PPO

## 2017-01-17 ENCOUNTER — Other Ambulatory Visit (HOSPITAL_BASED_OUTPATIENT_CLINIC_OR_DEPARTMENT_OTHER): Payer: PPO

## 2017-01-17 ENCOUNTER — Ambulatory Visit (HOSPITAL_BASED_OUTPATIENT_CLINIC_OR_DEPARTMENT_OTHER): Payer: PPO

## 2017-01-17 DIAGNOSIS — C772 Secondary and unspecified malignant neoplasm of intra-abdominal lymph nodes: Secondary | ICD-10-CM

## 2017-01-17 DIAGNOSIS — C679 Malignant neoplasm of bladder, unspecified: Secondary | ICD-10-CM

## 2017-01-17 DIAGNOSIS — Z5111 Encounter for antineoplastic chemotherapy: Secondary | ICD-10-CM | POA: Diagnosis not present

## 2017-01-17 LAB — CMP (CANCER CENTER ONLY)
ALT(SGPT): 32 U/L (ref 10–47)
AST: 29 U/L (ref 11–38)
Albumin: 3.4 g/dL (ref 3.3–5.5)
Alkaline Phosphatase: 49 U/L (ref 26–84)
BUN, Bld: 28 mg/dL — ABNORMAL HIGH (ref 7–22)
CO2: 33 mEq/L (ref 18–33)
Calcium: 9.1 mg/dL (ref 8.0–10.3)
Chloride: 95 mEq/L — ABNORMAL LOW (ref 98–108)
Creat: 1.2 mg/dl (ref 0.6–1.2)
Glucose, Bld: 176 mg/dL — ABNORMAL HIGH (ref 73–118)
Potassium: 3.3 mEq/L (ref 3.3–4.7)
Sodium: 138 mEq/L (ref 128–145)
Total Bilirubin: 0.8 mg/dl (ref 0.20–1.60)
Total Protein: 5.8 g/dL — ABNORMAL LOW (ref 6.4–8.1)

## 2017-01-17 LAB — CBC WITH DIFFERENTIAL (CANCER CENTER ONLY)
BASO#: 0 10*3/uL (ref 0.0–0.2)
BASO%: 0 % (ref 0.0–2.0)
EOS%: 0.8 % (ref 0.0–7.0)
Eosinophils Absolute: 0 10*3/uL (ref 0.0–0.5)
HCT: 31.8 % — ABNORMAL LOW (ref 38.7–49.9)
HGB: 11.1 g/dL — ABNORMAL LOW (ref 13.0–17.1)
LYMPH#: 2.4 10*3/uL (ref 0.9–3.3)
LYMPH%: 50.4 % — ABNORMAL HIGH (ref 14.0–48.0)
MCH: 30.8 pg (ref 28.0–33.4)
MCHC: 34.9 g/dL (ref 32.0–35.9)
MCV: 88 fL (ref 82–98)
MONO#: 0.2 10*3/uL (ref 0.1–0.9)
MONO%: 4.6 % (ref 0.0–13.0)
NEUT#: 2.1 10*3/uL (ref 1.5–6.5)
NEUT%: 44.2 % (ref 40.0–80.0)
Platelets: 203 10*3/uL (ref 145–400)
RBC: 3.6 10*6/uL — ABNORMAL LOW (ref 4.20–5.70)
RDW: 16.1 % — ABNORMAL HIGH (ref 11.1–15.7)
WBC: 4.8 10*3/uL (ref 4.0–10.0)

## 2017-01-17 LAB — LACTATE DEHYDROGENASE: LDH: 237 U/L (ref 125–245)

## 2017-01-17 MED ORDER — GEMCITABINE HCL CHEMO INJECTION 1 GM/26.3ML
2600.0000 mg | Freq: Once | INTRAVENOUS | Status: AC
  Administered 2017-01-17: 2600 mg via INTRAVENOUS
  Filled 2017-01-17: qty 52.6

## 2017-01-17 MED ORDER — HEPARIN SOD (PORK) LOCK FLUSH 100 UNIT/ML IV SOLN
500.0000 [IU] | Freq: Once | INTRAVENOUS | Status: AC | PRN
  Administered 2017-01-17: 500 [IU]
  Filled 2017-01-17: qty 5

## 2017-01-17 MED ORDER — SODIUM CHLORIDE 0.9 % IV SOLN
Freq: Once | INTRAVENOUS | Status: AC
  Administered 2017-01-17: 10:00:00 via INTRAVENOUS

## 2017-01-17 MED ORDER — PROCHLORPERAZINE MALEATE 10 MG PO TABS
10.0000 mg | ORAL_TABLET | Freq: Once | ORAL | Status: AC
  Administered 2017-01-17: 10 mg via ORAL

## 2017-01-17 MED ORDER — PROCHLORPERAZINE MALEATE 10 MG PO TABS
ORAL_TABLET | ORAL | Status: AC
  Filled 2017-01-17: qty 1

## 2017-01-17 MED ORDER — SODIUM CHLORIDE 0.9% FLUSH
10.0000 mL | INTRAVENOUS | Status: DC | PRN
  Administered 2017-01-17: 10 mL
  Filled 2017-01-17: qty 10

## 2017-01-17 NOTE — Progress Notes (Signed)
Dr Marin Olp notified of BUN-[id number].  Order received to give pt an additional 500 ml of NS IV over one hour today.

## 2017-01-17 NOTE — Patient Instructions (Signed)
[facility] Discharge Instructions for Patients Receiving Chemotherapy  Today you received the following chemotherapy agents:  Gemzar  To help prevent nausea and vomiting after your treatment, we encourage you to take your nausea medication as ordered per MD.    If you develop nausea and vomiting that is not controlled by your nausea medication, call the clinic.   BELOW ARE SYMPTOMS THAT SHOULD BE REPORTED IMMEDIATELY:  *FEVER GREATER THAN 100.5 F  *CHILLS WITH OR WITHOUT FEVER  NAUSEA AND VOMITING THAT IS NOT CONTROLLED WITH YOUR NAUSEA MEDICATION  *UNUSUAL SHORTNESS OF BREATH  *UNUSUAL BRUISING OR BLEEDING  TENDERNESS IN MOUTH AND THROAT WITH OR WITHOUT PRESENCE OF ULCERS  *URINARY PROBLEMS  *BOWEL PROBLEMS  UNUSUAL RASH Items with * indicate a potential emergency and should be followed up as soon as possible.  Feel free to call the clinic should you have any questions or concerns. The clinic phone number is [phone].  Please show the [facility] at check-in to the Emergency Department and triage nurse.

## 2017-01-25 LAB — SUSCEPTIBILITY, AER + ANAEROB

## 2017-01-26 ENCOUNTER — Other Ambulatory Visit: Payer: Self-pay | Admitting: Hematology & Oncology

## 2017-01-26 DIAGNOSIS — C679 Malignant neoplasm of bladder, unspecified: Secondary | ICD-10-CM

## 2017-01-26 DIAGNOSIS — C772 Secondary and unspecified malignant neoplasm of intra-abdominal lymph nodes: Secondary | ICD-10-CM

## 2017-01-28 ENCOUNTER — Other Ambulatory Visit: Payer: Self-pay | Admitting: *Deleted

## 2017-01-28 DIAGNOSIS — C772 Secondary and unspecified malignant neoplasm of intra-abdominal lymph nodes: Secondary | ICD-10-CM

## 2017-01-28 DIAGNOSIS — C679 Malignant neoplasm of bladder, unspecified: Secondary | ICD-10-CM

## 2017-01-31 ENCOUNTER — Ambulatory Visit: Payer: PPO

## 2017-01-31 ENCOUNTER — Ambulatory Visit (HOSPITAL_BASED_OUTPATIENT_CLINIC_OR_DEPARTMENT_OTHER): Payer: PPO

## 2017-01-31 ENCOUNTER — Ambulatory Visit (HOSPITAL_BASED_OUTPATIENT_CLINIC_OR_DEPARTMENT_OTHER): Payer: PPO | Admitting: Family

## 2017-01-31 ENCOUNTER — Other Ambulatory Visit (HOSPITAL_BASED_OUTPATIENT_CLINIC_OR_DEPARTMENT_OTHER): Payer: PPO

## 2017-01-31 VITALS — BP 143/71 | HR 75 | Temp 97.9°F | Resp 19 | Wt 261.0 lb

## 2017-01-31 DIAGNOSIS — R5383 Other fatigue: Secondary | ICD-10-CM

## 2017-01-31 DIAGNOSIS — C679 Malignant neoplasm of bladder, unspecified: Secondary | ICD-10-CM

## 2017-01-31 DIAGNOSIS — C772 Secondary and unspecified malignant neoplasm of intra-abdominal lymph nodes: Secondary | ICD-10-CM

## 2017-01-31 DIAGNOSIS — G629 Polyneuropathy, unspecified: Secondary | ICD-10-CM

## 2017-01-31 DIAGNOSIS — R110 Nausea: Secondary | ICD-10-CM | POA: Diagnosis not present

## 2017-01-31 DIAGNOSIS — Z5111 Encounter for antineoplastic chemotherapy: Secondary | ICD-10-CM | POA: Diagnosis not present

## 2017-01-31 LAB — CBC WITH DIFFERENTIAL (CANCER CENTER ONLY)
BASO#: 0 10*3/uL (ref 0.0–0.2)
BASO%: 0.5 % (ref 0.0–2.0)
EOS%: 1.6 % (ref 0.0–7.0)
Eosinophils Absolute: 0.1 10*3/uL (ref 0.0–0.5)
HCT: 30 % — ABNORMAL LOW (ref 38.7–49.9)
HGB: 10.2 g/dL — ABNORMAL LOW (ref 13.0–17.1)
LYMPH#: 2 10*3/uL (ref 0.9–3.3)
LYMPH%: 46.4 % (ref 14.0–48.0)
MCH: 31.4 pg (ref 28.0–33.4)
MCHC: 34 g/dL (ref 32.0–35.9)
MCV: 92 fL (ref 82–98)
MONO#: 0.5 10*3/uL (ref 0.1–0.9)
MONO%: 11.8 % (ref 0.0–13.0)
NEUT#: 1.7 10*3/uL (ref 1.5–6.5)
NEUT%: 39.7 % — ABNORMAL LOW (ref 40.0–80.0)
Platelets: 193 10*3/uL (ref 145–400)
RBC: 3.25 10*6/uL — ABNORMAL LOW (ref 4.20–5.70)
RDW: 16.7 % — ABNORMAL HIGH (ref 11.1–15.7)
WBC: 4.3 10*3/uL (ref 4.0–10.0)

## 2017-01-31 LAB — CMP (CANCER CENTER ONLY)
ALT(SGPT): 15 U/L (ref 10–47)
AST: 26 U/L (ref 11–38)
Albumin: 3.3 g/dL (ref 3.3–5.5)
Alkaline Phosphatase: 55 U/L (ref 26–84)
BUN, Bld: 14 mg/dL (ref 7–22)
CO2: 32 mEq/L (ref 18–33)
Calcium: 9.1 mg/dL (ref 8.0–10.3)
Chloride: 100 mEq/L (ref 98–108)
Creat: 1.4 mg/dl — ABNORMAL HIGH (ref 0.6–1.2)
Glucose, Bld: 197 mg/dL — ABNORMAL HIGH (ref 73–118)
Potassium: 3.7 mEq/L (ref 3.3–4.7)
Sodium: 140 mEq/L (ref 128–145)
Total Bilirubin: 0.6 mg/dl (ref 0.20–1.60)
Total Protein: 5.8 g/dL — ABNORMAL LOW (ref 6.4–8.1)

## 2017-01-31 LAB — LACTATE DEHYDROGENASE: LDH: 243 U/L (ref 125–245)

## 2017-01-31 MED ORDER — POTASSIUM CHLORIDE 2 MEQ/ML IV SOLN
Freq: Once | INTRAVENOUS | Status: AC
  Administered 2017-01-31: 10:00:00 via INTRAVENOUS
  Filled 2017-01-31: qty 10

## 2017-01-31 MED ORDER — SODIUM CHLORIDE 0.9 % IV SOLN
2600.0000 mg | Freq: Once | INTRAVENOUS | Status: AC
  Administered 2017-01-31: 2600 mg via INTRAVENOUS
  Filled 2017-01-31: qty 52.6

## 2017-01-31 MED ORDER — PALONOSETRON HCL INJECTION 0.25 MG/5ML
INTRAVENOUS | Status: AC
  Filled 2017-01-31: qty 5

## 2017-01-31 MED ORDER — PALONOSETRON HCL INJECTION 0.25 MG/5ML
0.2500 mg | Freq: Once | INTRAVENOUS | Status: AC
  Administered 2017-01-31: 0.25 mg via INTRAVENOUS

## 2017-01-31 MED ORDER — SODIUM CHLORIDE 0.9% FLUSH
10.0000 mL | INTRAVENOUS | Status: DC | PRN
  Administered 2017-01-31: 10 mL
  Filled 2017-01-31: qty 10

## 2017-01-31 MED ORDER — SODIUM CHLORIDE 0.9 % IV SOLN
Freq: Once | INTRAVENOUS | Status: AC
  Administered 2017-01-31: 10:00:00 via INTRAVENOUS

## 2017-01-31 MED ORDER — CISPLATIN CHEMO INJECTION 100MG/100ML
70.0000 mg/m2 | Freq: Once | INTRAVENOUS | Status: AC
  Administered 2017-01-31: 176 mg via INTRAVENOUS
  Filled 2017-01-31: qty 176

## 2017-01-31 MED ORDER — FOSAPREPITANT DIMEGLUMINE INJECTION 150 MG
Freq: Once | INTRAVENOUS | Status: AC
  Administered 2017-01-31: 13:00:00 via INTRAVENOUS
  Filled 2017-01-31: qty 5

## 2017-01-31 MED ORDER — HEPARIN SOD (PORK) LOCK FLUSH 100 UNIT/ML IV SOLN
500.0000 [IU] | Freq: Once | INTRAVENOUS | Status: AC | PRN
  Administered 2017-01-31: 500 [IU]
  Filled 2017-01-31: qty 5

## 2017-01-31 NOTE — Progress Notes (Signed)
Hematology and Oncology Follow Up Visit  [name] Rose [id number] [birth year] 66 y.o. 01/31/2017   Principle Diagnosis:  Metastatic high - grade bladder cancer  Current Therapy:   Cisplatin/Gemcitabine s/p cycle 5   Interim History:  [name] Rose is here today for follow-up and treatment. He is doing well but has had some mild fatigue. He will nap some during the day as needed and sometimes this means he is up at night. He is still getting up and staying active. He enjoys playing golf and plans to go again tomorrow.  He has had some constipation several days after each cycle and will start taking Mirilax tomorrow.   He has some nausea after each treatment and will take an antiemetic as needed.  No fever, chills, vomiting, cough, rash, dizziness, SOB, chest pain, palpitations, abdominal pain or changes in bowel or bladder habits.  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  He has maintained a good appetite but admits to needing to better hydrated. His weight is down 4 lbs since his last visit.   ECOG Performance Status: 1 - Symptomatic but completely ambulatory  Medications:  Allergies as of 01/31/2017   No Known Allergies     Medication List       Accurate as of 01/31/17  8:54 AM. Always use your most recent med list.          ALEVE 220 MG Caps Generic drug:  Naproxen Sodium Take 220 mg by mouth 2 (two) times daily.   amLODipine 10 MG tablet Commonly known as:  NORVASC Take 10 mg by mouth every morning.   bisoprolol-hydrochlorothiazide 10-6.25 MG tablet Commonly known as:  ZIAC Take 1 tablet by mouth every evening.   Cinnamon 500 MG Tabs Take 1 tablet by mouth 2 (two) times daily.   dexamethasone 4 MG tablet Commonly known as:  DECADRON Take 2 tablets by mouth once a day on the day after cisplatin chemotherapy and then take 2 tablets two times a day for 2 days. Take with food.   lidocaine-prilocaine  cream Commonly known as:  EMLA Apply to affected area once   LORazepam 0.5 MG tablet Commonly known as:  ATIVAN Take 1 tablet (0.5 mg total) by mouth every 6 (six) hours as needed (Nausea or vomiting).   losartan 100 MG tablet Commonly known as:  COZAAR Take 100 mg by mouth daily.   MAGNESIUM PO Take 400 mg by mouth at bedtime. Leg cramps   ondansetron 8 MG tablet Commonly known as:  ZOFRAN TAKE 1 TABLET BY MOUTH TWICE A DAY START 3RD DAY AFTER CHEMO   prochlorperazine 10 MG tablet Commonly known as:  COMPAZINE TAKE 1 TABLET (10 MG TOTAL) BY MOUTH EVERY 6 (SIX) HOURS AS NEEDED (NAUSEA OR VOMITING).       Allergies: No Known Allergies  Past Medical History, Surgical history, Social history, and Family History were reviewed and updated.  Review of Systems: All other 10 point review of systems is negative.   Physical Exam:  weight is 261 lb (118.4 kg). His oral temperature is 97.9 F (36.6 C). His blood pressure is 143/71 (abnormal) and his pulse is 75. His respiration is 19 and oxygen saturation is 98%.   Wt Readings from Last 3 Encounters:  01/31/17 261 lb (118.4 kg)  12/20/16 266 lb (120.7 kg)  11/29/16 265 lb (120.2 kg)    Ocular: Sclerae unicteric, pupils equal, round and reactive to light Ear-nose-throat: Oropharynx clear,  dentition fair Lymphatic: No cervical, supraclavicular or axillary adenopathy Lungs no rales or rhonchi, good excursion bilaterally Heart regular rate and rhythm, no murmur appreciated Abd soft, nontender, positive bowel sounds, no liver or spleen tip palpated on exam, no fluid wave MSK no focal spinal tenderness, no joint edema Neuro: non-focal, well-oriented, appropriate affect Breasts: Deferred   Lab Results  Component Value Date   WBC 4.3 01/31/2017   HGB 10.2 (L) 01/31/2017   HCT 30.0 (L) 01/31/2017   MCV 92 01/31/2017   PLT 193 01/31/2017   No results found for: FERRITIN, IRON, TIBC, UIBC, IRONPCTSAT Lab Results  Component  Value Date   RBC 3.25 (L) 01/31/2017   No results found for: KPAFRELGTCHN, LAMBDASER, KAPLAMBRATIO No results found for: IGGSERUM, IGA, IGMSERUM No results found for: [name], SPEI   Chemistry      Component Value Date/Time   NA 138 01/17/2017 0810   K 3.3 01/17/2017 0810   CL 95 (L) 01/17/2017 0810   CO2 33 01/17/2017 0810   BUN 28 (H) 01/17/2017 0810   CREATININE 1.2 01/17/2017 0810      Component Value Date/Time   CALCIUM 9.1 01/17/2017 0810   ALKPHOS 49 01/17/2017 0810   AST 29 01/17/2017 0810   ALT 32 01/17/2017 0810   BILITOT 0.80 01/17/2017 0810      Impression and Plan: [name] Rose is a very pleasant 66 yo caucasian gentleman with metastatic high grade bladder cancer. He continues to do well with treatment and ill proceed with cycle 6 today as planned.  He has had some mild fatigue and nauseas that responds well to antiemetics. He has been able to stay active and golfs several days a week.  He has his current treatment and appointment schedule.  He will contact our office with any questions or concerns. We can certainly see him sooner if need be.   Eliezer Bottom, NP 10/22/20188:54 AM

## 2017-01-31 NOTE — Patient Instructions (Signed)
[facility] Discharge Instructions for Patients Receiving Chemotherapy  Today you received the following chemotherapy agents:  Cisplatin and Gemzar  To help prevent nausea and vomiting after your treatment, we encourage you to take your nausea medication as ordered per MD.    If you develop nausea and vomiting that is not controlled by your nausea medication, call the clinic.   BELOW ARE SYMPTOMS THAT SHOULD BE REPORTED IMMEDIATELY:  *FEVER GREATER THAN 100.5 F  *CHILLS WITH OR WITHOUT FEVER  NAUSEA AND VOMITING THAT IS NOT CONTROLLED WITH YOUR NAUSEA MEDICATION  *UNUSUAL SHORTNESS OF BREATH  *UNUSUAL BRUISING OR BLEEDING  TENDERNESS IN MOUTH AND THROAT WITH OR WITHOUT PRESENCE OF ULCERS  *URINARY PROBLEMS  *BOWEL PROBLEMS  UNUSUAL RASH Items with * indicate a potential emergency and should be followed up as soon as possible.  Feel free to call the clinic should you have any questions or concerns. The clinic phone number is [phone].  Please show the [place] at check-in to the Emergency Department and triage nurse.

## 2017-02-04 ENCOUNTER — Other Ambulatory Visit: Payer: Self-pay

## 2017-02-04 DIAGNOSIS — C679 Malignant neoplasm of bladder, unspecified: Secondary | ICD-10-CM

## 2017-02-04 DIAGNOSIS — C772 Secondary and unspecified malignant neoplasm of intra-abdominal lymph nodes: Secondary | ICD-10-CM

## 2017-02-07 ENCOUNTER — Ambulatory Visit (HOSPITAL_BASED_OUTPATIENT_CLINIC_OR_DEPARTMENT_OTHER): Payer: PPO

## 2017-02-07 ENCOUNTER — Ambulatory Visit: Payer: PPO

## 2017-02-07 ENCOUNTER — Other Ambulatory Visit: Payer: Self-pay | Admitting: Family

## 2017-02-07 ENCOUNTER — Other Ambulatory Visit (HOSPITAL_BASED_OUTPATIENT_CLINIC_OR_DEPARTMENT_OTHER): Payer: PPO

## 2017-02-07 DIAGNOSIS — C772 Secondary and unspecified malignant neoplasm of intra-abdominal lymph nodes: Secondary | ICD-10-CM

## 2017-02-07 DIAGNOSIS — C679 Malignant neoplasm of bladder, unspecified: Secondary | ICD-10-CM

## 2017-02-07 DIAGNOSIS — Z5111 Encounter for antineoplastic chemotherapy: Secondary | ICD-10-CM | POA: Diagnosis not present

## 2017-02-07 LAB — CBC WITH DIFFERENTIAL (CANCER CENTER ONLY)
BASO#: 0 10*3/uL (ref 0.0–0.2)
BASO%: 0 % (ref 0.0–2.0)
EOS%: 0.6 % (ref 0.0–7.0)
Eosinophils Absolute: 0 10*3/uL (ref 0.0–0.5)
HCT: 31.1 % — ABNORMAL LOW (ref 38.7–49.9)
HGB: 11.1 g/dL — ABNORMAL LOW (ref 13.0–17.1)
LYMPH#: 2.6 10*3/uL (ref 0.9–3.3)
LYMPH%: 50.5 % — ABNORMAL HIGH (ref 14.0–48.0)
MCH: 31.9 pg (ref 28.0–33.4)
MCHC: 35.7 g/dL (ref 32.0–35.9)
MCV: 89 fL (ref 82–98)
MONO#: 0.2 10*3/uL (ref 0.1–0.9)
MONO%: 4.2 % (ref 0.0–13.0)
NEUT#: 2.3 10*3/uL (ref 1.5–6.5)
NEUT%: 44.7 % (ref 40.0–80.0)
Platelets: 207 10*3/uL (ref 145–400)
RBC: 3.48 10*6/uL — ABNORMAL LOW (ref 4.20–5.70)
RDW: 15.2 % (ref 11.1–15.7)
WBC: 5.1 10*3/uL (ref 4.0–10.0)

## 2017-02-07 LAB — CMP (CANCER CENTER ONLY)
ALT(SGPT): 24 U/L (ref 10–47)
AST: 25 U/L (ref 11–38)
Albumin: 3.6 g/dL (ref 3.3–5.5)
Alkaline Phosphatase: 56 U/L (ref 26–84)
BUN, Bld: 33 mg/dL — ABNORMAL HIGH (ref 7–22)
CO2: 33 mEq/L (ref 18–33)
Calcium: 9.2 mg/dL (ref 8.0–10.3)
Chloride: 94 mEq/L — ABNORMAL LOW (ref 98–108)
Creat: 1.6 mg/dl — ABNORMAL HIGH (ref 0.6–1.2)
Glucose, Bld: 200 mg/dL — ABNORMAL HIGH (ref 73–118)
Potassium: 3.3 mEq/L (ref 3.3–4.7)
Sodium: 139 mEq/L (ref 128–145)
Total Bilirubin: 0.8 mg/dl (ref 0.20–1.60)
Total Protein: 6 g/dL — ABNORMAL LOW (ref 6.4–8.1)

## 2017-02-07 LAB — LACTATE DEHYDROGENASE: LDH: 246 U/L — ABNORMAL HIGH (ref 125–245)

## 2017-02-07 MED ORDER — SODIUM CHLORIDE 0.9 % IV SOLN
Freq: Once | INTRAVENOUS | Status: AC
  Administered 2017-02-07: 09:00:00 via INTRAVENOUS

## 2017-02-07 MED ORDER — HEPARIN SOD (PORK) LOCK FLUSH 100 UNIT/ML IV SOLN
500.0000 [IU] | Freq: Once | INTRAVENOUS | Status: AC | PRN
  Administered 2017-02-07: 500 [IU]
  Filled 2017-02-07: qty 5

## 2017-02-07 MED ORDER — SODIUM CHLORIDE 0.9 % IV SOLN
2600.0000 mg | Freq: Once | INTRAVENOUS | Status: AC
  Administered 2017-02-07: 2600 mg via INTRAVENOUS
  Filled 2017-02-07: qty 52.6

## 2017-02-07 MED ORDER — PROCHLORPERAZINE MALEATE 10 MG PO TABS
10.0000 mg | ORAL_TABLET | Freq: Once | ORAL | Status: AC
  Administered 2017-02-07: 10 mg via ORAL

## 2017-02-07 MED ORDER — SODIUM CHLORIDE 0.9% FLUSH
10.0000 mL | INTRAVENOUS | Status: DC | PRN
  Administered 2017-02-07: 10 mL
  Filled 2017-02-07: qty 10

## 2017-02-07 MED ORDER — PROCHLORPERAZINE MALEATE 10 MG PO TABS
ORAL_TABLET | ORAL | Status: AC
  Filled 2017-02-07: qty 1

## 2017-02-07 NOTE — Patient Instructions (Signed)
[facility] Discharge Instructions for Patients Receiving Chemotherapy  Today you received the following chemotherapy agents:  Gemzar  To help prevent nausea and vomiting after your treatment, we encourage you to take your nausea medication as ordered per MD.    If you develop nausea and vomiting that is not controlled by your nausea medication, call the clinic.   BELOW ARE SYMPTOMS THAT SHOULD BE REPORTED IMMEDIATELY:  *FEVER GREATER THAN 100.5 F  *CHILLS WITH OR WITHOUT FEVER  NAUSEA AND VOMITING THAT IS NOT CONTROLLED WITH YOUR NAUSEA MEDICATION  *UNUSUAL SHORTNESS OF BREATH  *UNUSUAL BRUISING OR BLEEDING  TENDERNESS IN MOUTH AND THROAT WITH OR WITHOUT PRESENCE OF ULCERS  *URINARY PROBLEMS  *BOWEL PROBLEMS  UNUSUAL RASH Items with * indicate a potential emergency and should be followed up as soon as possible.  Feel free to call the clinic should you have any questions or concerns. The clinic phone number is [phone].  Please show the [place] at check-in to the Emergency Department and triage nurse.

## 2017-02-07 NOTE — Progress Notes (Signed)
[name] notified of [phone].  Order received to give patient additional 500 ml of NS over one hour during treatment today.

## 2017-02-17 ENCOUNTER — Ambulatory Visit (HOSPITAL_COMMUNITY)
Admission: RE | Admit: 2017-02-17 | Discharge: 2017-02-17 | Disposition: A | Discharge status: Home / Self Care | Payer: PPO | Source: Ambulatory Visit | Attending: Family | Admitting: Family

## 2017-02-17 DIAGNOSIS — C772 Secondary and unspecified malignant neoplasm of intra-abdominal lymph nodes: Secondary | ICD-10-CM | POA: Diagnosis not present

## 2017-02-17 DIAGNOSIS — K5730 Diverticulosis of large intestine without perforation or abscess without bleeding: Secondary | ICD-10-CM | POA: Insufficient documentation

## 2017-02-17 DIAGNOSIS — C679 Malignant neoplasm of bladder, unspecified: Secondary | ICD-10-CM | POA: Diagnosis not present

## 2017-02-17 LAB — GLUCOSE, CAPILLARY: Glucose-Capillary: 89 mg/dL (ref 65–99)

## 2017-02-17 MED ORDER — FLUDEOXYGLUCOSE F - 18 (FDG) INJECTION
13.9000 | Freq: Once | INTRAVENOUS | Status: AC | PRN
  Administered 2017-02-17: 13.9 via INTRAVENOUS

## 2017-02-18 ENCOUNTER — Telehealth: Payer: Self-pay | Admitting: Family

## 2017-02-18 NOTE — Telephone Encounter (Signed)
I spoke with [name] Rose and went over his PET scan results in detail. All questions were answered and we will plan to see him on Monday.

## 2017-02-19 ENCOUNTER — Other Ambulatory Visit: Payer: Self-pay | Admitting: Hematology & Oncology

## 2017-02-19 DIAGNOSIS — C772 Secondary and unspecified malignant neoplasm of intra-abdominal lymph nodes: Secondary | ICD-10-CM

## 2017-02-19 DIAGNOSIS — C679 Malignant neoplasm of bladder, unspecified: Secondary | ICD-10-CM

## 2017-02-21 ENCOUNTER — Ambulatory Visit: Payer: PPO

## 2017-02-21 ENCOUNTER — Other Ambulatory Visit (HOSPITAL_BASED_OUTPATIENT_CLINIC_OR_DEPARTMENT_OTHER): Payer: PPO

## 2017-02-21 ENCOUNTER — Other Ambulatory Visit: Payer: Self-pay

## 2017-02-21 ENCOUNTER — Encounter: Payer: Self-pay | Admitting: Family

## 2017-02-21 ENCOUNTER — Ambulatory Visit (HOSPITAL_BASED_OUTPATIENT_CLINIC_OR_DEPARTMENT_OTHER): Payer: PPO | Admitting: Family

## 2017-02-21 VITALS — Wt 258.0 lb

## 2017-02-21 DIAGNOSIS — H9312 Tinnitus, left ear: Secondary | ICD-10-CM | POA: Diagnosis not present

## 2017-02-21 DIAGNOSIS — C679 Malignant neoplasm of bladder, unspecified: Secondary | ICD-10-CM

## 2017-02-21 DIAGNOSIS — H9311 Tinnitus, right ear: Secondary | ICD-10-CM | POA: Diagnosis not present

## 2017-02-21 DIAGNOSIS — C772 Secondary and unspecified malignant neoplasm of intra-abdominal lymph nodes: Secondary | ICD-10-CM | POA: Diagnosis not present

## 2017-02-21 LAB — CBC WITH DIFFERENTIAL (CANCER CENTER ONLY)
BASO#: 0 10*3/uL (ref 0.0–0.2)
BASO%: 0.7 % (ref 0.0–2.0)
EOS%: 1.8 % (ref 0.0–7.0)
Eosinophils Absolute: 0.1 10*3/uL (ref 0.0–0.5)
HCT: 29.3 % — ABNORMAL LOW (ref 38.7–49.9)
HGB: 10.1 g/dL — ABNORMAL LOW (ref 13.0–17.1)
LYMPH#: 2 10*3/uL (ref 0.9–3.3)
LYMPH%: 43.9 % (ref 14.0–48.0)
MCH: 32 pg (ref 28.0–33.4)
MCHC: 34.5 g/dL (ref 32.0–35.9)
MCV: 93 fL (ref 82–98)
MONO#: 0.6 10*3/uL (ref 0.1–0.9)
MONO%: 13 % (ref 0.0–13.0)
NEUT#: 1.8 10*3/uL (ref 1.5–6.5)
NEUT%: 40.6 % (ref 40.0–80.0)
Platelets: 173 10*3/uL (ref 145–400)
RBC: 3.16 10*6/uL — ABNORMAL LOW (ref 4.20–5.70)
RDW: 15.5 % (ref 11.1–15.7)
WBC: 4.5 10*3/uL (ref 4.0–10.0)

## 2017-02-21 LAB — CMP (CANCER CENTER ONLY)
ALT(SGPT): 18 U/L (ref 10–47)
AST: 22 U/L (ref 11–38)
Albumin: 3.6 g/dL (ref 3.3–5.5)
Alkaline Phosphatase: 65 U/L (ref 26–84)
BUN, Bld: 17 mg/dL (ref 7–22)
CO2: 31 mEq/L (ref 18–33)
Calcium: 9.2 mg/dL (ref 8.0–10.3)
Chloride: 98 mEq/L (ref 98–108)
Creat: 1.2 mg/dl (ref 0.6–1.2)
Glucose, Bld: 174 mg/dL — ABNORMAL HIGH (ref 73–118)
Potassium: 4.1 mEq/L (ref 3.3–4.7)
Sodium: 141 mEq/L (ref 128–145)
Total Bilirubin: 0.6 mg/dl (ref 0.20–1.60)
Total Protein: 6.1 g/dL — ABNORMAL LOW (ref 6.4–8.1)

## 2017-02-21 LAB — LACTATE DEHYDROGENASE: LDH: 261 U/L — ABNORMAL HIGH (ref 125–245)

## 2017-02-21 MED ORDER — HEPARIN SOD (PORK) LOCK FLUSH 100 UNIT/ML IV SOLN
500.0000 [IU] | Freq: Once | INTRAVENOUS | Status: AC
  Administered 2017-02-21: 500 [IU] via INTRAVENOUS
  Filled 2017-02-21: qty 5

## 2017-02-21 MED ORDER — SODIUM CHLORIDE 0.9% FLUSH
10.0000 mL | INTRAVENOUS | Status: DC | PRN
  Administered 2017-02-21: 10 mL via INTRAVENOUS
  Filled 2017-02-21: qty 10

## 2017-02-21 NOTE — Progress Notes (Signed)
Hematology and Oncology Follow Up Visit  [name] Rose [id number] 06-07-1950 66 y.o. 02/21/2017   Principle Diagnosis:  Metastatic high - grade bladder cancer  Current Therapy:   Cisplatin/Gemcitabine s/p cycle 6   Interim History:  [name] Rose is here today with his wife for follow-up and treatment. He is doing well but having some fatigue. He is napping some throughout the day and not always sleeping good at night.  His PET scan last week showed stable disease with no progression or new areas of disease. Now that it is cold he is only golfing twice a week. He is hoping to join silver sneakers at his gym in January. He would like to build up his stamina.  He has a mild ringing in his ears.  He has occasional nausea and vomits stomach acid. He states that he does not vomit food.  No fever, chills, cough, rash, dizziness, SOB, chest pain, palpitations, abdominal pain or changes in bowel or bladder habits.  He has some puffiness in his ankles that comes and goes. No swelling or tenderness in his extremities at this time. The numbness and tingling in his feet is unchanged.  His appetite is ok but he admits that he needs to drink more fluids. His weight is down 3 lbs since his last visit.   ECOG Performance Status: 1 - Symptomatic but completely ambulatory  Medications:  Allergies as of 02/21/2017   No Known Allergies     Medication List        Accurate as of 02/21/17 10:08 AM. Always use your most recent med list.          ALEVE 220 MG Caps Generic drug:  Naproxen Sodium Take 220 mg by mouth 2 (two) times daily.   amLODipine 10 MG tablet Commonly known as:  NORVASC Take 10 mg by mouth every morning.   bisoprolol-hydrochlorothiazide 10-6.25 MG tablet Commonly known as:  ZIAC Take 1 tablet by mouth every evening.   Cinnamon 500 MG Tabs Take 1 tablet by mouth 2 (two) times daily.   dexamethasone 4 MG tablet Commonly known as:  DECADRON TAKE 2 TABLETS BY MOUTH THE  DAY AFTER CISPLATIN AND THEN 2 TWICE DAILY FOR 2 DAYS   lidocaine-prilocaine cream Commonly known as:  EMLA Apply to affected area once   LORazepam 0.5 MG tablet Commonly known as:  ATIVAN TAKE 1 TABLET BY MOUTH EVERY 6 HOURS AS NEEDED FOR NAUSEA AND VOMITING   losartan 100 MG tablet Commonly known as:  COZAAR Take 100 mg by mouth daily.   MAGNESIUM PO Take 400 mg by mouth at bedtime. Leg cramps   ondansetron 8 MG tablet Commonly known as:  ZOFRAN TAKE 1 TABLET BY MOUTH TWICE A DAY START 3RD DAY AFTER CHEMO   prochlorperazine 10 MG tablet Commonly known as:  COMPAZINE TAKE 1 TABLET (10 MG TOTAL) BY MOUTH EVERY 6 (SIX) HOURS AS NEEDED (NAUSEA OR VOMITING).       Allergies: No Known Allergies  Past Medical History, Surgical history, Social history, and Family History were reviewed and updated.  Review of Systems: All other 10 point review of systems is negative.   Physical Exam:  weight is 258 lb (117 kg).   Wt Readings from Last 3 Encounters:  02/21/17 258 lb (117 kg)  01/31/17 261 lb (118.4 kg)  12/20/16 266 lb (120.7 kg)    Ocular: Sclerae unicteric, pupils equal, round and reactive to light Ear-nose-throat: Oropharynx clear, dentition fair Lymphatic: No cervical, supraclavicular or  axillary adenopathy Lungs no rales or rhonchi, good excursion bilaterally Heart regular rate and rhythm, no murmur appreciated Abd soft, nontender, positive bowel sounds MSK no focal spinal tenderness, no joint edema Neuro: non-focal, well-oriented, appropriate affect Breasts: Deferred   Lab Results  Component Value Date   WBC 4.5 02/21/2017   HGB 10.1 (L) 02/21/2017   HCT 29.3 (L) 02/21/2017   MCV 93 02/21/2017   PLT 173 02/21/2017   No results found for: FERRITIN, IRON, TIBC, UIBC, IRONPCTSAT Lab Results  Component Value Date   RBC 3.16 (L) 02/21/2017   No results found for: KPAFRELGTCHN, LAMBDASER, KAPLAMBRATIO No results found for: IGGSERUM, IGA, IGMSERUM No  results found for: [name], SPEI   Chemistry      Component Value Date/Time   NA 139 02/07/2017 0812   K 3.3 02/07/2017 0812   CL 94 (L) 02/07/2017 0812   CO2 33 02/07/2017 0812   BUN 33 (H) 02/07/2017 0812   CREATININE 1.6 (H) 02/07/2017 0812      Component Value Date/Time   CALCIUM 9.2 02/07/2017 0812   ALKPHOS 56 02/07/2017 0812   AST 25 02/07/2017 0812   ALT 24 02/07/2017 0812   BILITOT 0.80 02/07/2017 0812      Impression and Plan: [name] Rose is a very pleasant 66 yo caucasian gentleman with metastatic high grade bladder cancer. He has done well with 6 cycles of Cisplatin/Gemcitabine and his PET scan last week showed stable disease.  He has had some fatigue and a mild ringing in his ears. He has still been able to stay active golfing a couple times a week and plans to join the gym.  [name] reviewed his scan and we will now give him a break from treatment so he can enjoy the holidays with his sweet family.  We will repeat a PET scan in 6 weeks to evaluate for progression off treatment.  We will plan to see him back in 2 months for follow-up and repeat lab.  Greater than 50% of his 25 minute face to face visit was spent counseling and coordinating care.  He is in agreement with the plan and will contact our office with any questions or concerns. We can certainly see him sooner if need be.   Eliezer Bottom, NP 11/12/201810:08 AM

## 2017-02-21 NOTE — Addendum Note (Signed)
Addended by: Johny Drilling on: 02/21/2017 10:38 AM   Modules accepted: Orders, SmartSet

## 2017-02-28 ENCOUNTER — Other Ambulatory Visit: Payer: PPO

## 2017-02-28 ENCOUNTER — Ambulatory Visit: Payer: PPO

## 2017-03-14 ENCOUNTER — Ambulatory Visit: Payer: PPO

## 2017-03-14 ENCOUNTER — Other Ambulatory Visit: Payer: PPO

## 2017-03-14 ENCOUNTER — Ambulatory Visit: Payer: PPO | Admitting: Family

## 2017-03-18 DIAGNOSIS — G629 Polyneuropathy, unspecified: Secondary | ICD-10-CM | POA: Diagnosis not present

## 2017-03-18 DIAGNOSIS — M5412 Radiculopathy, cervical region: Secondary | ICD-10-CM | POA: Diagnosis not present

## 2017-03-18 DIAGNOSIS — R7303 Prediabetes: Secondary | ICD-10-CM | POA: Diagnosis not present

## 2017-03-18 DIAGNOSIS — Z0000 Encounter for general adult medical examination without abnormal findings: Secondary | ICD-10-CM | POA: Diagnosis not present

## 2017-03-18 DIAGNOSIS — E7800 Pure hypercholesterolemia, unspecified: Secondary | ICD-10-CM | POA: Diagnosis not present

## 2017-03-18 DIAGNOSIS — Z8546 Personal history of malignant neoplasm of prostate: Secondary | ICD-10-CM | POA: Diagnosis not present

## 2017-03-18 DIAGNOSIS — Z23 Encounter for immunization: Secondary | ICD-10-CM | POA: Diagnosis not present

## 2017-03-18 DIAGNOSIS — Z1159 Encounter for screening for other viral diseases: Secondary | ICD-10-CM | POA: Diagnosis not present

## 2017-03-18 DIAGNOSIS — I10 Essential (primary) hypertension: Secondary | ICD-10-CM | POA: Diagnosis not present

## 2017-03-18 DIAGNOSIS — D494 Neoplasm of unspecified behavior of bladder: Secondary | ICD-10-CM | POA: Diagnosis not present

## 2017-03-21 ENCOUNTER — Other Ambulatory Visit: Payer: PPO

## 2017-03-21 ENCOUNTER — Ambulatory Visit: Payer: PPO

## 2017-04-06 ENCOUNTER — Other Ambulatory Visit: Payer: PPO

## 2017-04-07 ENCOUNTER — Other Ambulatory Visit: Payer: Self-pay

## 2017-04-07 ENCOUNTER — Ambulatory Visit (HOSPITAL_BASED_OUTPATIENT_CLINIC_OR_DEPARTMENT_OTHER): Payer: PPO

## 2017-04-07 VITALS — BP 123/47 | HR 74 | Temp 97.8°F

## 2017-04-07 DIAGNOSIS — Z452 Encounter for adjustment and management of vascular access device: Secondary | ICD-10-CM | POA: Diagnosis not present

## 2017-04-07 DIAGNOSIS — C679 Malignant neoplasm of bladder, unspecified: Secondary | ICD-10-CM

## 2017-04-07 DIAGNOSIS — C772 Secondary and unspecified malignant neoplasm of intra-abdominal lymph nodes: Secondary | ICD-10-CM

## 2017-04-07 MED ORDER — HEPARIN SOD (PORK) LOCK FLUSH 100 UNIT/ML IV SOLN
500.0000 [IU] | Freq: Once | INTRAVENOUS | Status: AC
  Administered 2017-04-07: 500 [IU] via INTRAVENOUS
  Filled 2017-04-07: qty 5

## 2017-04-07 MED ORDER — SODIUM CHLORIDE 0.9% FLUSH
10.0000 mL | INTRAVENOUS | Status: DC | PRN
Start: 2017-04-07 — End: 2017-04-07
  Administered 2017-04-07: 10 mL via INTRAVENOUS
  Filled 2017-04-07: qty 10

## 2017-04-07 NOTE — Patient Instructions (Signed)
Implanted Port Home Guide An implanted port is a type of central line that is placed under the skin. Central lines are used to provide IV access when treatment or nutrition needs to be given through a person's veins. Implanted ports are used for long-term IV access. An implanted port may be placed because:  You need IV medicine that would be irritating to the small veins in your hands or arms.  You need long-term IV medicines, such as antibiotics.  You need IV nutrition for a long period.  You need frequent blood draws for lab tests.  You need dialysis.  Implanted ports are usually placed in the chest area, but they can also be placed in the upper arm, the abdomen, or the leg. An implanted port has two main parts:  Reservoir. The reservoir is round and will appear as a small, raised area under your skin. The reservoir is the part where a needle is inserted to give medicines or draw blood.  Catheter. The catheter is a thin, flexible tube that extends from the reservoir. The catheter is placed into a large vein. Medicine that is inserted into the reservoir goes into the catheter and then into the vein.  How will I care for my incision site? Do not get the incision site wet. Bathe or shower as directed by your health care provider. How is my port accessed? Special steps must be taken to access the port:  Before the port is accessed, a numbing cream can be placed on the skin. This helps numb the skin over the port site.  Your health care provider uses a sterile technique to access the port. ? Your health care provider must put on a mask and sterile gloves. ? The skin over your port is cleaned carefully with an antiseptic and allowed to dry. ? The port is gently pinched between sterile gloves, and a needle is inserted into the port.  Only "non-coring" port needles should be used to access the port. Once the port is accessed, a blood return should be checked. This helps ensure that the port  is in the vein and is not clogged.  If your port needs to remain accessed for a constant infusion, a clear (transparent) bandage will be placed over the needle site. The bandage and needle will need to be changed every week, or as directed by your health care provider.  Keep the bandage covering the needle clean and dry. Do not get it wet. Follow your health care provider's instructions on how to take a shower or bath while the port is accessed.  If your port does not need to stay accessed, no bandage is needed over the port.  What is flushing? Flushing helps keep the port from getting clogged. Follow your health care provider's instructions on how and when to flush the port. Ports are usually flushed with saline solution or a medicine called heparin. The need for flushing will depend on how the port is used.  If the port is used for intermittent medicines or blood draws, the port will need to be flushed: ? After medicines have been given. ? After blood has been drawn. ? As part of routine maintenance.  If a constant infusion is running, the port may not need to be flushed.  How long will my port stay implanted? The port can stay in for as long as your health care provider thinks it is needed. When it is time for the port to come out, surgery will be   done to remove it. The procedure is similar to the one performed when the port was put in. When should I seek immediate medical care? When you have an implanted port, you should seek immediate medical care if:  You notice a bad smell coming from the incision site.  You have swelling, redness, or drainage at the incision site.  You have more swelling or pain at the port site or the surrounding area.  You have a fever that is not controlled with medicine.  This information is not intended to replace advice given to you by your health care provider. Make sure you discuss any questions you have with your health care provider. Document  Released: 03/29/2005 Document Revised: 09/04/2015 Document Reviewed: 12/04/2012 Elsevier Interactive Patient Education  2017 [employer]  

## 2017-04-08 ENCOUNTER — Ambulatory Visit (HOSPITAL_COMMUNITY)
Admission: RE | Admit: 2017-04-08 | Discharge: 2017-04-08 | Disposition: A | Discharge status: Home / Self Care | Payer: PPO | Source: Ambulatory Visit | Attending: Family | Admitting: Family

## 2017-04-08 DIAGNOSIS — K449 Diaphragmatic hernia without obstruction or gangrene: Secondary | ICD-10-CM | POA: Diagnosis not present

## 2017-04-08 DIAGNOSIS — K5730 Diverticulosis of large intestine without perforation or abscess without bleeding: Secondary | ICD-10-CM | POA: Diagnosis not present

## 2017-04-08 DIAGNOSIS — C772 Secondary and unspecified malignant neoplasm of intra-abdominal lymph nodes: Secondary | ICD-10-CM | POA: Insufficient documentation

## 2017-04-08 DIAGNOSIS — I700 Atherosclerosis of aorta: Secondary | ICD-10-CM | POA: Diagnosis not present

## 2017-04-08 DIAGNOSIS — I2510 Atherosclerotic heart disease of native coronary artery without angina pectoris: Secondary | ICD-10-CM | POA: Insufficient documentation

## 2017-04-08 DIAGNOSIS — C679 Malignant neoplasm of bladder, unspecified: Secondary | ICD-10-CM | POA: Diagnosis not present

## 2017-04-08 LAB — GLUCOSE, CAPILLARY: Glucose-Capillary: 116 mg/dL — ABNORMAL HIGH (ref 65–99)

## 2017-04-08 MED ORDER — FLUDEOXYGLUCOSE F - 18 (FDG) INJECTION
12.9700 | Freq: Once | INTRAVENOUS | Status: AC | PRN
  Administered 2017-04-08: 12.97 via INTRAVENOUS

## 2017-04-11 ENCOUNTER — Telehealth: Payer: Self-pay | Admitting: Family

## 2017-04-11 ENCOUNTER — Other Ambulatory Visit: Payer: Self-pay | Admitting: Family

## 2017-04-11 DIAGNOSIS — C679 Malignant neoplasm of bladder, unspecified: Secondary | ICD-10-CM

## 2017-04-11 DIAGNOSIS — C775 Secondary and unspecified malignant neoplasm of intrapelvic lymph nodes: Secondary | ICD-10-CM

## 2017-04-11 DIAGNOSIS — C772 Secondary and unspecified malignant neoplasm of intra-abdominal lymph nodes: Secondary | ICD-10-CM

## 2017-04-11 NOTE — Telephone Encounter (Signed)
I spoke with [name] Rose's wife and went over his PET scan results in detail. [name] would like him to have a right external iliac node excision and biopsy. All questions for now were answered and she will have him call back with any further questions once he gets home. Scheduling message was sent to [facility].

## 2017-04-14 ENCOUNTER — Encounter: Payer: Self-pay | Admitting: Neurology

## 2017-04-19 ENCOUNTER — Other Ambulatory Visit: Payer: Self-pay | Admitting: Radiology

## 2017-04-20 ENCOUNTER — Other Ambulatory Visit: Payer: Self-pay | Admitting: Radiology

## 2017-04-21 ENCOUNTER — Encounter (HOSPITAL_COMMUNITY): Payer: Self-pay

## 2017-04-21 ENCOUNTER — Ambulatory Visit (HOSPITAL_COMMUNITY)
Admission: RE | Admit: 2017-04-21 | Discharge: 2017-04-21 | Disposition: A | Discharge status: Home / Self Care | Payer: PPO | Source: Ambulatory Visit | Attending: Family | Admitting: Family

## 2017-04-21 DIAGNOSIS — R590 Localized enlarged lymph nodes: Secondary | ICD-10-CM | POA: Diagnosis not present

## 2017-04-21 DIAGNOSIS — C679 Malignant neoplasm of bladder, unspecified: Secondary | ICD-10-CM | POA: Insufficient documentation

## 2017-04-21 DIAGNOSIS — C775 Secondary and unspecified malignant neoplasm of intrapelvic lymph nodes: Secondary | ICD-10-CM

## 2017-04-21 DIAGNOSIS — C772 Secondary and unspecified malignant neoplasm of intra-abdominal lymph nodes: Secondary | ICD-10-CM | POA: Diagnosis not present

## 2017-04-21 DIAGNOSIS — Z8551 Personal history of malignant neoplasm of bladder: Secondary | ICD-10-CM | POA: Diagnosis not present

## 2017-04-21 LAB — CBC WITH DIFFERENTIAL/PLATELET
Basophils Absolute: 0 10*3/uL (ref 0.0–0.1)
Basophils Relative: 0 %
Eosinophils Absolute: 0.1 10*3/uL (ref 0.0–0.7)
Eosinophils Relative: 3 %
HCT: 34.2 % — ABNORMAL LOW (ref 39.0–52.0)
Hemoglobin: 11.5 g/dL — ABNORMAL LOW (ref 13.0–17.0)
Lymphocytes Relative: 42 %
Lymphs Abs: 2.1 10*3/uL (ref 0.7–4.0)
MCH: 30.2 pg (ref 26.0–34.0)
MCHC: 33.6 g/dL (ref 30.0–36.0)
MCV: 89.8 fL (ref 78.0–100.0)
Monocytes Absolute: 0.5 10*3/uL (ref 0.1–1.0)
Monocytes Relative: 10 %
Neutro Abs: 2.3 10*3/uL (ref 1.7–7.7)
Neutrophils Relative %: 45 %
Platelets: 131 10*3/uL — ABNORMAL LOW (ref 150–400)
RBC: 3.81 MIL/uL — ABNORMAL LOW (ref 4.22–5.81)
RDW: 12.5 % (ref 11.5–15.5)
WBC: 5.1 10*3/uL (ref 4.0–10.5)

## 2017-04-21 LAB — BASIC METABOLIC PANEL
Anion gap: 8 (ref 5–15)
BUN: 30 mg/dL — ABNORMAL HIGH (ref 6–20)
CO2: 28 mmol/L (ref 22–32)
Calcium: 8.9 mg/dL (ref 8.9–10.3)
Chloride: 103 mmol/L (ref 101–111)
Creatinine, Ser: 1.21 mg/dL (ref 0.61–1.24)
GFR calc Af Amer: >60 mL/min (ref >60)
GFR calc non Af Amer: >60 mL/min (ref >60)
Glucose, Bld: 116 mg/dL — ABNORMAL HIGH (ref 65–99)
Potassium: 4 mmol/L (ref 3.5–5.1)
Sodium: 139 mmol/L (ref 135–145)

## 2017-04-21 LAB — PROTIME-INR
INR: 0.99
Prothrombin Time: 13 seconds (ref 11.4–15.2)

## 2017-04-21 MED ORDER — MIDAZOLAM HCL 2 MG/2ML IJ SOLN
INTRAMUSCULAR | Status: AC | PRN
  Administered 2017-04-21 (×2): 1 mg via INTRAVENOUS

## 2017-04-21 MED ORDER — FENTANYL CITRATE (PF) 100 MCG/2ML IJ SOLN
INTRAMUSCULAR | Status: AC | PRN
  Administered 2017-04-21 (×2): 50 ug via INTRAVENOUS

## 2017-04-21 MED ORDER — LIDOCAINE HCL (PF) 1 % IJ SOLN
INTRAMUSCULAR | Status: AC | PRN
  Administered 2017-04-21: 20 mL

## 2017-04-21 MED ORDER — MIDAZOLAM HCL 2 MG/2ML IJ SOLN
INTRAMUSCULAR | Status: AC
  Filled 2017-04-21: qty 4

## 2017-04-21 MED ORDER — SODIUM CHLORIDE 0.9 % IV SOLN
INTRAVENOUS | Status: DC
  Administered 2017-04-21: 08:00:00 via INTRAVENOUS

## 2017-04-21 MED ORDER — FENTANYL CITRATE (PF) 100 MCG/2ML IJ SOLN
INTRAMUSCULAR | Status: AC
  Filled 2017-04-21: qty 2

## 2017-04-21 NOTE — Discharge Instructions (Signed)

## 2017-04-21 NOTE — Procedures (Signed)
Pre procedural Dx: Right external iliac LAN Post procedural Dx: Same  Technically successful CT guided biopsy of indeterminate right external iliac LN.   EBL: None.   Complications: None immediate.   Ronny Bacon, MD Pager #: [phone]

## 2017-04-21 NOTE — H&P (Signed)
Referring Physician(s): Ennever,P  Supervising Physician: Sandi Mariscal  Patient Status:  [facility] OP  Chief Complaint:  "I'm having a biopsy"  Subjective: Patient familiar to IR service from prior Port-A-Cath placement in June of last year.  He has a history of metastatic high-grade bladder cancer, status post chemotherapy.  Recent PET scan has revealed new hypermetabolic right external iliac lymphadenopathy concerning for recurrent disease.  He presents today for image guided iliac lymph node biopsy to rule out progression of disease while off chemotherapy.  He currently denies fever, headache, chest pain, dyspnea, cough, abdominal/back pain, nausea, vomiting or bleeding. Past Medical History:  Diagnosis Date  . Arthritis   . 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 ([place]) 09/29/2016  . Bladder tumor   . Goals of care, counseling/discussion 09/30/2016  . History of prostate cancer followed by pcp dr [name]-  per pt last PSA undetectable   dx 2008-- (Stage T1c, Gleason 3+3,  PSA 4.58, vol 99cc)  s/p  radical prostatectomy (nerve sparing bilateral)   . Hypertension   . Lower urinary tract symptoms (LUTS)   . Pre-diabetes   . Wears glasses    Past Surgical History:  Procedure Laterality Date  . CATARACT EXTRACTION W/ INTRAOCULAR LENS  IMPLANT, BILATERAL Bilateral 2011  . [place]  . IR FLUORO GUIDE PORT INSERTION RIGHT  10/07/2016  . IR US GUIDE VASC ACCESS RIGHT  10/07/2016  . KNEE ARTHROSCOPY Bilateral right 2006;  left 02-15-2007  . [place];  1990;  1983  . ROBOT ASSISTED LAPAROSCOPIC RADICAL PROSTATECTOMY  06/20/2006   bilateral nerve sparing  . TOTAL HIP ARTHROPLASTY Left 03/03/2015   Procedure: LEFT TOTAL HIP ARTHROPLASTY ANTERIOR APPROACH;  Surgeon: Dorna Leitz, MD;  Location: [facility];  Service: Orthopedics;  Laterality: Left;  . TOTAL KNEE ARTHROPLASTY Bilateral left 08-13-2009;  right 12-26-2009  . TRANSURETHRAL RESECTION  OF BLADDER TUMOR N/A 09/20/2016   Procedure: TRANSURETHRAL RESECTION OF BLADDER TUMOR (TURBT);  Surgeon: Franchot Gallo, MD;  Location: [facility];  Service: Urology;  Laterality: N/A;      Allergies: Patient has no known allergies.  Medications: Prior to Admission medications   Medication Sig Start Date End Date Taking? Authorizing Provider  amLODipine (NORVASC) 10 MG tablet Take 10 mg by mouth every morning.   Yes [provider]  bisoprolol-hydrochlorothiazide (ZIAC) 10-6.25 MG tablet Take 1 tablet by mouth every evening.    Yes [provider]  Cinnamon 500 MG TABS Take 1 tablet by mouth 2 (two) times daily.    Yes [provider]  gabapentin (NEURONTIN) 100 MG capsule Take 100 mg by mouth 3 (three) times daily.   Yes [provider]  losartan (COZAAR) 100 MG tablet Take 100 mg by mouth daily.   Yes [provider]  MAGNESIUM PO Take 400 mg by mouth at bedtime. Leg cramps   Yes [provider]  Naproxen Sodium (ALEVE) 220 MG CAPS Take 220 mg by mouth 2 (two) times daily.    Yes [provider]  dexamethasone (DECADRON) 4 MG tablet TAKE 2 TABLETS BY MOUTH THE DAY AFTER CISPLATIN AND THEN 2 TWICE DAILY FOR 2 DAYS 02/21/17   Volanda Napoleon, MD  lidocaine-prilocaine (EMLA) cream Apply to affected area once 10/11/16   Volanda Napoleon, MD  LORazepam (ATIVAN) 0.5 MG tablet TAKE 1 TABLET BY MOUTH EVERY 6 HOURS AS NEEDED FOR NAUSEA AND VOMITING 02/21/17   Volanda Napoleon, MD  ondansetron (ZOFRAN) 8 MG tablet TAKE 1 TABLET BY MOUTH TWICE A DAY START 3RD DAY AFTER CHEMO 01/10/17   Volanda Napoleon, MD  prochlorperazine (COMPAZINE) 10 MG tablet TAKE 1 TABLET (10 MG TOTAL) BY MOUTH EVERY 6 (SIX) HOURS AS NEEDED (NAUSEA OR VOMITING). 01/26/17   Volanda Napoleon, MD     Vital Signs: BP 122/88 (BP Location: Right Arm)   Pulse 64   Temp 98.9 F (37.2 C) (Oral)   Resp 18   Ht $R'6\' 2"'IH$  (1.88 m)   Wt 265 lb (120.2 kg)    SpO2 96%   BMI 34.02 kg/m   Physical Exam awake, alert.  Chest clear to auscultation bilaterally.  Right chest wall Port-A-Cath intact; heart with regular rate and rhythm.  Abdomen soft, obese, positive bowel sounds, nontender.  Trace bilateral lower extremity edema  Imaging: No results found.  Labs:  CBC: Recent Labs    01/31/17 0817 02/07/17 0812 02/21/17 0902 04/21/17 0729  WBC 4.3 5.1 4.5 5.1  HGB 10.2* 11.1* 10.1* 11.5*  HCT 30.0* 31.1* 29.3* 34.2*  PLT 193 207 173 131*    COAGS: Recent Labs    10/07/16 0754 10/18/16 0511 04/21/17 0729  INR 1.04 1.29 0.99  APTT 31  --   --     BMP: Recent Labs    10/19/16 0645 10/20/16 0500 10/21/16 0425  01/31/17 0817 02/07/17 0812 02/21/17 0902 04/21/17 0729  NA 133* 135 135   < > 140 139 141 139  K 2.7* 2.7* 3.2*   < > 3.7 3.3 4.1 4.0  CL 98* 98* 99*   < > 100 94* 98 103  CO2 $Re'29 29 30   'fid$ < > 32 33 31 28  GLUCOSE 120* 124* 114*   < > 197* 200* 174* 116*  BUN $Re'14 13 9   'RdT$ < > 14 33* 17 30*  CALCIUM 7.5* 7.6* 7.7*   < > 9.1 9.2 9.2 8.9  CREATININE 0.74 0.72 0.65   < > 1.4* 1.6* 1.2 1.21  GFRNONAA >60 >60 >60  --   --   --   --  >60  GFRAA >60 >60 >60  --   --   --   --  >60   < > = values in this interval not displayed.    LIVER FUNCTION TESTS: Recent Labs    01/17/17 0810 01/31/17 0817 02/07/17 0812 02/21/17 0902  BILITOT 0.80 0.60 0.80 0.60  AST $Re'29 26 25 22  'aAE$ ALT 32 $Remo'15 24 18  'kCZYe$ ALKPHOS 49 55 56 65  PROT 5.8* 5.8* 6.0* 6.1*  ALBUMIN 3.4 3.3 3.6 3.6    Assessment and Plan: Pt with history of metastatic high-grade bladder cancer, status post chemotherapy.  Recent PET scan has revealed new hypermetabolic right external iliac lymphadenopathy concerning for recurrent disease.  He presents today for image guided iliac lymph node biopsy to rule out progression of disease while off chemotherapy. Risks and benefits discussed with the patient/spouse including, but not limited to bleeding, infection, damage to adjacent  structures or low yield requiring additional tests.All of the patient's questions were answered, patient is agreeable to proceed. Consent signed and in chart.     Electronically Signed: D. Rowe Robert, PA-C 04/21/2017, 8:32 AM   I spent a total of 25 minutes at the the patient's bedside AND on the patient's hospital floor or unit, greater than 50% of which was counseling/coordinating care for image guided biopsy of right external iliac lymph node

## 2017-04-25 ENCOUNTER — Other Ambulatory Visit: Payer: Self-pay

## 2017-04-25 ENCOUNTER — Inpatient Hospital Stay: Payer: PPO | Attending: Hematology & Oncology | Admitting: Hematology & Oncology

## 2017-04-25 ENCOUNTER — Inpatient Hospital Stay: Payer: PPO

## 2017-04-25 ENCOUNTER — Ambulatory Visit: Payer: PPO

## 2017-04-25 VITALS — BP 140/71 | HR 78 | Temp 98.1°F | Resp 19 | Wt 275.3 lb

## 2017-04-25 VITALS — BP 140/71 | HR 78 | Resp 19 | Wt 275.0 lb

## 2017-04-25 DIAGNOSIS — Z5111 Encounter for antineoplastic chemotherapy: Secondary | ICD-10-CM | POA: Diagnosis not present

## 2017-04-25 DIAGNOSIS — Z8619 Personal history of other infectious and parasitic diseases: Secondary | ICD-10-CM | POA: Insufficient documentation

## 2017-04-25 DIAGNOSIS — C772 Secondary and unspecified malignant neoplasm of intra-abdominal lymph nodes: Secondary | ICD-10-CM

## 2017-04-25 DIAGNOSIS — C679 Malignant neoplasm of bladder, unspecified: Secondary | ICD-10-CM | POA: Diagnosis not present

## 2017-04-25 DIAGNOSIS — C778 Secondary and unspecified malignant neoplasm of lymph nodes of multiple regions: Secondary | ICD-10-CM

## 2017-04-25 DIAGNOSIS — Z95828 Presence of other vascular implants and grafts: Secondary | ICD-10-CM

## 2017-04-25 DIAGNOSIS — Z79899 Other long term (current) drug therapy: Secondary | ICD-10-CM | POA: Insufficient documentation

## 2017-04-25 DIAGNOSIS — C649 Malignant neoplasm of unspecified kidney, except renal pelvis: Secondary | ICD-10-CM

## 2017-04-25 LAB — CBC WITH DIFFERENTIAL/PLATELET
Basophils Absolute: 0 10*3/uL (ref 0.0–0.1)
Basophils Relative: 1 %
Eosinophils Absolute: 0.1 10*3/uL (ref 0.0–0.5)
Eosinophils Relative: 2 %
HCT: 37.4 % — ABNORMAL LOW (ref 38.7–49.9)
Hemoglobin: 12.6 g/dL — ABNORMAL LOW (ref 13.0–17.1)
Lymphocytes Relative: 36 %
Lymphs Abs: 2.3 10*3/uL (ref 0.9–3.3)
MCH: 30.7 pg (ref 28.0–33.4)
MCHC: 33.7 g/dL (ref 32.0–35.9)
MCV: 91 fL (ref 82.0–98.0)
Monocytes Absolute: 0.5 10*3/uL (ref 0.1–0.9)
Monocytes Relative: 7 %
Neutro Abs: 3.5 10*3/uL (ref 1.5–6.5)
Neutrophils Relative %: 54 %
Platelets: 161 10*3/uL (ref 145–400)
RBC: 4.11 MIL/uL — ABNORMAL LOW (ref 4.20–5.70)
RDW: 12.1 % (ref 11.1–15.7)
WBC: 6.4 10*3/uL (ref 4.0–10.0)

## 2017-04-25 LAB — COMPREHENSIVE METABOLIC PANEL
ALT: 24 U/L (ref 10–47)
AST: 22 U/L (ref 11–38)
Albumin: 3.6 g/dL (ref 3.5–5.0)
Alkaline Phosphatase: 58 U/L (ref 26–84)
Anion gap: 16 — ABNORMAL HIGH (ref 5–15)
BUN: 21 mg/dL (ref 7–22)
CO2: 32 mmol/L (ref 18–33)
Calcium: 9.2 mg/dL (ref 8.0–10.3)
Chloride: 97 mmol/L — ABNORMAL LOW (ref 98–108)
Creatinine, Ser: 1.3 mg/dL — ABNORMAL HIGH (ref 0.60–1.20)
Glucose, Bld: 147 mg/dL — ABNORMAL HIGH (ref 70–118)
Potassium: 4 mmol/L (ref 3.3–4.7)
Sodium: 145 mmol/L (ref 128–145)
Total Bilirubin: 0.6 mg/dL (ref 0.2–1.6)
Total Protein: 6.1 g/dL — ABNORMAL LOW (ref 6.4–8.1)

## 2017-04-25 LAB — CBC
HCT: 37.2 % — ABNORMAL LOW (ref 38.7–49.9)
Hemoglobin: 12.5 g/dL — ABNORMAL LOW (ref 13.0–17.1)
MCH: 30.5 pg (ref 28.0–33.4)
MCHC: 33.6 g/dL (ref 32.0–35.9)
MCV: 90.7 fL (ref 82.0–98.0)
Platelets: 146 10*3/uL (ref 145–400)
RBC: 4.1 MIL/uL — ABNORMAL LOW (ref 4.20–5.70)
RDW: 12.1 % (ref 11.1–15.7)
WBC: 6 10*3/uL (ref 4.0–10.0)

## 2017-04-25 LAB — LACTATE DEHYDROGENASE: LDH: 196 U/L (ref 125–245)

## 2017-04-25 MED ORDER — SODIUM CHLORIDE 0.9% FLUSH
10.0000 mL | INTRAVENOUS | Status: DC | PRN
  Administered 2017-04-25: 10 mL via INTRAVENOUS
  Filled 2017-04-25: qty 10

## 2017-04-25 MED ORDER — HEPARIN SOD (PORK) LOCK FLUSH 100 UNIT/ML IV SOLN
500.0000 [IU] | Freq: Once | INTRAVENOUS | Status: AC
  Administered 2017-04-25: 500 [IU] via INTRAVENOUS
  Filled 2017-04-25: qty 5

## 2017-04-25 NOTE — Progress Notes (Signed)
Hematology and Oncology Follow Up Visit  [name] Rose [id number] 09/21/1950 67 y.o. 04/25/2017   Principle Diagnosis:  Metastatic high - grade bladder cancer -- recurrent  Current Therapy:  Atezolizumab $RemoveBefor'1200mg'vIGyxbfWjQFh$  IV q 3 wks - start on 05/03/2017  Cisplatin/Gemcitabine s/p cycle 6   Interim History:  [name] Rose is here today for follow-up.  Unfortunately, it looks like we do have recurrent disease.  We did do a PET scan on him.  This was done on December 28.  The PET scan showed a new right external iliac lymph node.  There was also a new subcentimeter right supraclavicular lymph node.  Outside of that, everything else looked good.  We then did a biopsy on the lymph node.  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  I am going to send the specimen off for genetic testing.  He feels well.  He had a good Thanksgiving, Christmas and New Year's holiday.  He has been eating well.  He is gained quite a bit of weight.  There is been no pain.  He said no nausea or vomiting.  Is had no cough or shortness of breath.  Has had no rashes.  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  He is gotten over this.  He has had no change in bowel or bladder habits.  Overall, his performance status is ECOG 0.   Medications:  Allergies as of 04/25/2017   No Known Allergies     Medication List        Accurate as of 04/25/17 11:00 AM. Always use your most recent med list.          ALEVE 220 MG Caps Generic drug:  Naproxen Sodium Take 220 mg by mouth 2 (two) times daily.   amLODipine 10 MG tablet Commonly known as:  NORVASC Take 10 mg by mouth every morning.   bisoprolol-hydrochlorothiazide 10-6.25 MG tablet Commonly known as:  ZIAC Take 1 tablet by mouth every evening.   Cinnamon 500 MG Tabs Take 1 tablet by mouth 2 (two) times daily.   gabapentin 100 MG capsule Commonly known as:  NEURONTIN Take 100 mg by mouth 3  (three) times daily.   LORazepam 0.5 MG tablet Commonly known as:  ATIVAN TAKE 1 TABLET BY MOUTH EVERY 6 HOURS AS NEEDED FOR NAUSEA AND VOMITING   losartan 100 MG tablet Commonly known as:  COZAAR Take 100 mg by mouth daily.   MAGNESIUM PO Take 400 mg by mouth at bedtime. Leg cramps   ondansetron 8 MG tablet Commonly known as:  ZOFRAN TAKE 1 TABLET BY MOUTH TWICE A DAY START 3RD DAY AFTER CHEMO   prochlorperazine 10 MG tablet Commonly known as:  COMPAZINE TAKE 1 TABLET (10 MG TOTAL) BY MOUTH EVERY 6 (SIX) HOURS AS NEEDED (NAUSEA OR VOMITING).       Allergies: No Known Allergies  Past Medical History, Surgical history, Social history, and Family History were reviewed and updated.  Review of Systems: Review of Systems  Constitutional: Negative.   HENT: Negative.   Eyes: Negative.   Respiratory: Negative.   Cardiovascular: Negative.   Gastrointestinal: Negative.   Genitourinary: Negative.   Musculoskeletal: Negative.   Skin: Negative.   Neurological: Negative.   Endo/Heme/Allergies: Negative.   Psychiatric/Behavioral: Negative.      Physical Exam:  weight is 275 lb 5 oz (124.9 kg). His oral temperature is 98.1 F (36.7 C). His blood pressure is 140/71 and his pulse is  78. His respiration is 19 and oxygen saturation is 100%.   Wt Readings from Last 3 Encounters:  04/25/17 275 lb 5 oz (124.9 kg)  04/25/17 275 lb (124.7 kg)  04/21/17 265 lb (120.2 kg)    Physical Exam  Constitutional: He is oriented to person, place, and time.  HENT:  Head: Normocephalic and atraumatic.  Mouth/Throat: Oropharynx is clear and moist.  Eyes: EOM are normal. Pupils are equal, round, and reactive to light.  Neck: Normal range of motion.  Cardiovascular: Normal rate, regular rhythm and normal heart sounds.  Pulmonary/Chest: Effort normal and breath sounds normal.  Abdominal: Soft. Bowel sounds are normal.  Musculoskeletal: Normal range of motion. He exhibits no edema, tenderness  or deformity.  Lymphadenopathy:    He has no cervical adenopathy.  Neurological: He is alert and oriented to person, place, and time.  Skin: Skin is warm and dry. No rash noted. No erythema.  Psychiatric: He has a normal mood and affect. His behavior is normal. Judgment and thought content normal.  Vitals reviewed.   Lab Results  Component Value Date   WBC 6.4 04/25/2017   HGB 12.6 (L) 04/25/2017   HCT 37.4 (L) 04/25/2017   MCV 91.0 04/25/2017   PLT 161 04/25/2017   No results found for: FERRITIN, IRON, TIBC, UIBC, IRONPCTSAT Lab Results  Component Value Date   RBC 4.11 (L) 04/25/2017   No results found for: KPAFRELGTCHN, LAMBDASER, KAPLAMBRATIO No results found for: IGGSERUM, IGA, IGMSERUM No results found for: [name], SPEI   Chemistry      Component Value Date/Time   NA 145 04/25/2017 0911   NA 141 02/21/2017 0902   K 4.0 04/25/2017 0911   K 4.1 02/21/2017 0902   CL 97 (L) 04/25/2017 0911   CL 98 02/21/2017 0902   CO2 32 04/25/2017 0911   CO2 31 02/21/2017 0902   BUN 21 04/25/2017 0911   BUN 17 02/21/2017 0902   CREATININE 1.30 (H) 04/25/2017 0911   CREATININE 1.2 02/21/2017 0902      Component Value Date/Time   CALCIUM 9.2 04/25/2017 0911   CALCIUM 9.2 02/21/2017 0902   ALKPHOS 58 04/25/2017 0911   ALKPHOS 65 02/21/2017 0902   AST 22 04/25/2017 0911   AST 22 02/21/2017 0902   ALT 24 04/25/2017 0911   ALT 18 02/21/2017 0902   BILITOT 0.6 04/25/2017 0911   BILITOT 0.60 02/21/2017 0902      Impression and Plan: [name] Rose is a very pleasant 66 yo caucasian gentleman with metastatic high grade bladder cancer. He has done well with 6 cycles of Cisplatin/Gemcitabine.  He has last cycle back in October.  At this point, I think we have to think about immunotherapy.  I would choose Tecentriq.  I think this would be reasonable.  Is a single dose once every 3 weeks.  I talked to [name] Rose about this.   I went over the PET scan with him.  I went over the pathology report with him.  I hate that his cancer has recurred.  Thankfully, he does not have extensive disease.  I would think that given the fact that he has lymph node only disease should indicate that he should respond pretty well to immunotherapy.  I am going to send his specimen off for genetic testing so we can see if there is anything that we might be able to target at a molecular level if immunotherapy does not work.  I  went over the side effects of immunotherapy.  I told him about diarrhea, hepatic inflammation, lung inflammation, and skin rash.  He understands all this.  I think the side effects should be less than 20% of patients.  We will start next week.  I will give him 3 cycles and then repeat his PET scan.  I spent about 40 minutes with him today.  This was a little more complicated given that he has recurrent disease.   Volanda Napoleon, MD 1/14/201911:00 AM

## 2017-04-25 NOTE — Progress Notes (Signed)
DISCONTINUE ON PATHWAY REGIMEN - Bladder     A cycle is every 21 days:     Gemcitabine      Cisplatin   **Always confirm dose/schedule in your pharmacy ordering system**    REASON: Disease Progression PRIOR TREATMENT: [id number]: Gemcitabine 1,000 mg/m2 D1, 8 + Cisplatin 70 mg/m2 D1 q21 Days for a Maximum of 6 Cycles TREATMENT RESPONSE: Partial Response (PR)  START ON PATHWAY REGIMEN - Bladder     A cycle is 21 days:     Pembrolizumab   **Always confirm dose/schedule in your pharmacy ordering system**    Patient Characteristics: Metastatic Disease, Second Line AJCC M Category: M1a AJCC N Category: N3 AJCC T Category: T3 Current evidence of distant metastases<= Yes AJCC 8 Stage Grouping: IVA Line of Therapy: Second Line Would you be surprised if this patient died  in the next year<= I would be surprised if this patient died in the next year Intent of Therapy: Non-Curative / Palliative Intent, Discussed with Patient

## 2017-05-03 ENCOUNTER — Inpatient Hospital Stay: Payer: PPO

## 2017-05-03 ENCOUNTER — Encounter: Payer: Self-pay | Admitting: Hematology & Oncology

## 2017-05-03 ENCOUNTER — Other Ambulatory Visit: Payer: Self-pay

## 2017-05-03 DIAGNOSIS — Z5111 Encounter for antineoplastic chemotherapy: Secondary | ICD-10-CM | POA: Diagnosis not present

## 2017-05-03 DIAGNOSIS — C772 Secondary and unspecified malignant neoplasm of intra-abdominal lymph nodes: Secondary | ICD-10-CM

## 2017-05-03 DIAGNOSIS — C679 Malignant neoplasm of bladder, unspecified: Secondary | ICD-10-CM

## 2017-05-03 LAB — CMP (CANCER CENTER ONLY)
ALT: 15 U/L (ref 0–55)
AST: 25 U/L (ref 5–34)
Albumin: 3.6 g/dL (ref 3.5–5.0)
Alkaline Phosphatase: 60 U/L (ref 26–84)
Anion gap: 9 (ref 5–15)
BUN: 23 mg/dL — ABNORMAL HIGH (ref 7–22)
CO2: 32 mmol/L (ref 18–33)
Calcium: 9.4 mg/dL (ref 8.0–10.3)
Chloride: 102 mmol/L (ref 98–108)
Creatinine: 1.3 mg/dL (ref 0.70–1.30)
Glucose, Bld: 186 mg/dL — ABNORMAL HIGH (ref 70–118)
Potassium: 3.9 mmol/L (ref 3.3–4.7)
Sodium: 143 mmol/L (ref 128–145)
Total Bilirubin: 0.7 mg/dL (ref 0.2–1.2)
Total Protein: 6.5 g/dL (ref 6.4–8.1)

## 2017-05-03 LAB — CBC WITH DIFFERENTIAL (CANCER CENTER ONLY)
Basophils Absolute: 0 10*3/uL (ref 0.0–0.1)
Basophils Relative: 0 %
Eosinophils Absolute: 0.1 10*3/uL (ref 0.0–0.5)
Eosinophils Relative: 2 %
HCT: 37.3 % — ABNORMAL LOW (ref 38.7–49.9)
Hemoglobin: 12.5 g/dL — ABNORMAL LOW (ref 13.0–17.1)
Lymphocytes Relative: 39 %
Lymphs Abs: 2.4 10*3/uL (ref 0.9–3.3)
MCH: 30.3 pg (ref 28.0–33.4)
MCHC: 33.5 g/dL (ref 32.0–35.9)
MCV: 90.5 fL (ref 82.0–98.0)
Monocytes Absolute: 0.5 10*3/uL (ref 0.1–0.9)
Monocytes Relative: 8 %
Neutro Abs: 3.1 10*3/uL (ref 1.5–6.5)
Neutrophils Relative %: 51 %
Platelet Count: 139 10*3/uL — ABNORMAL LOW (ref 140–400)
RBC: 4.12 MIL/uL — ABNORMAL LOW (ref 4.20–5.70)
RDW: 12.3 % (ref 11.1–15.7)
WBC Count: 6.1 10*3/uL (ref 4.0–10.3)

## 2017-05-03 LAB — LACTATE DEHYDROGENASE: LDH: 202 U/L (ref 125–245)

## 2017-05-03 MED ORDER — SODIUM CHLORIDE 0.9 % IV SOLN
1200.0000 mg | Freq: Once | INTRAVENOUS | Status: AC
  Administered 2017-05-03: 1200 mg via INTRAVENOUS
  Filled 2017-05-03: qty 20

## 2017-05-03 MED ORDER — GABAPENTIN 100 MG PO CAPS: 200.0000 mg | ORAL_CAPSULE | Freq: Three times a day (TID) | ORAL | 3 refills | Status: DC

## 2017-05-03 MED ORDER — SODIUM CHLORIDE 0.9 % IV SOLN
Freq: Once | INTRAVENOUS | Status: AC
  Administered 2017-05-03: 10:00:00 via INTRAVENOUS

## 2017-05-03 MED ORDER — SODIUM CHLORIDE 0.9% FLUSH
10.0000 mL | INTRAVENOUS | Status: DC | PRN
  Administered 2017-05-03: 10 mL
  Filled 2017-05-03: qty 10

## 2017-05-03 MED ORDER — HEPARIN SOD (PORK) LOCK FLUSH 100 UNIT/ML IV SOLN
500.0000 [IU] | Freq: Once | INTRAVENOUS | Status: AC | PRN
  Administered 2017-05-03: 500 [IU]
  Filled 2017-05-03: qty 5

## 2017-05-03 NOTE — Patient Instructions (Signed)
Atezolizumab injection What is this medicine? ATEZOLIZUMAB (a te zoe LIZ ue mab) is a monoclonal antibody. It is used to treat bladder cancer (urothelial cancer) and non-small cell lung cancer. This medicine may be used for other purposes; ask your health care provider or pharmacist if you have questions. COMMON BRAND NAME(S): Tecentriq What should I tell my health care provider before I take this medicine? They need to know if you have any of these conditions: -diabetes -immune system problems -infection -inflammatory bowel disease -liver disease -lung or breathing disease -lupus -nervous system problems like myasthenia gravis or Guillain-Barre syndrome -organ transplant -an unusual or allergic reaction to atezolizumab, other medicines, foods, dyes, or preservatives -pregnant or trying to get pregnant -breast-feeding How should I use this medicine? This medicine is for infusion into a vein. It is given by a health care professional in a hospital or clinic setting. A special MedGuide will be given to you before each treatment. Be sure to read this information carefully each time. Talk to your pediatrician regarding the use of this medicine in children. Special care may be needed. Overdosage: If you think you have taken too much of this medicine contact a poison control center or emergency room at once. NOTE: This medicine is only for you. Do not share this medicine with others. What if I miss a dose? It is important not to miss your dose. Call your doctor or health care professional if you are unable to keep an appointment. What may interact with this medicine? Interactions have not been studied. This list may not describe all possible interactions. Give your health care provider a list of all the medicines, herbs, non-prescription drugs, or dietary supplements you use. Also tell them if you smoke, drink alcohol, or use illegal drugs. Some items may interact with your medicine. What  should I watch for while using this medicine? Your condition will be monitored carefully while you are receiving this medicine. You may need blood work done while you are taking this medicine. Do not become pregnant while taking this medicine or for at least 5 months after stopping it. Women should inform their doctor if they wish to become pregnant or think they might be pregnant. There is a potential for serious side effects to an unborn child. Talk to your health care professional or pharmacist for more information. Do not breast-feed an infant while taking this medicine or for at least 5 months after the last dose. What side effects may I notice from receiving this medicine? Side effects that you should report to your doctor or health care professional as soon as possible: -allergic reactions like skin rash, itching or hives, swelling of the face, lips, or tongue -black, tarry stools -bloody or watery diarrhea -breathing problems -changes in vision -chest pain or chest tightness -chills -facial flushing -fever -headache -signs and symptoms of high blood sugar such as dizziness; dry mouth; dry skin; fruity breath; nausea; stomach pain; increased hunger or thirst; increased urination -signs and symptoms of liver injury like dark yellow or brown urine; general ill feeling or flu-like symptoms; light-colored stools; loss of appetite; nausea; right upper belly pain; unusually weak or tired; yellowing of the eyes or skin -stomach pain -trouble passing urine or change in the amount of urine Side effects that usually do not require medical attention (report to your doctor or health care professional if they continue or are bothersome): -cough -diarrhea -joint pain -muscle pain -muscle weakness -tiredness -weight loss This list may not describe all  possible side effects. Call your doctor for medical advice about side effects. You may report side effects to FDA at 1-800-FDA-1088. Where should  I keep my medicine? This drug is given in a hospital or clinic and will not be stored at home. NOTE: This sheet is a summary. It may not cover all possible information. If you have questions about this medicine, talk to your doctor, pharmacist, or health care provider.  2018 Elsevier/Gold Standard (2015-04-30 17:54:14)

## 2017-05-03 NOTE — Patient Instructions (Signed)
Implanted Port Home Guide An implanted port is a type of central line that is placed under the skin. Central lines are used to provide IV access when treatment or nutrition needs to be given through a person's veins. Implanted ports are used for long-term IV access. An implanted port may be placed because:  You need IV medicine that would be irritating to the small veins in your hands or arms.  You need long-term IV medicines, such as antibiotics.  You need IV nutrition for a long period.  You need frequent blood draws for lab tests.  You need dialysis.  Implanted ports are usually placed in the chest area, but they can also be placed in the upper arm, the abdomen, or the leg. An implanted port has two main parts:  Reservoir. The reservoir is round and will appear as a small, raised area under your skin. The reservoir is the part where a needle is inserted to give medicines or draw blood.  Catheter. The catheter is a thin, flexible tube that extends from the reservoir. The catheter is placed into a large vein. Medicine that is inserted into the reservoir goes into the catheter and then into the vein.  How will I care for my incision site? Do not get the incision site wet. Bathe or shower as directed by your health care provider. How is my port accessed? Special steps must be taken to access the port:  Before the port is accessed, a numbing cream can be placed on the skin. This helps numb the skin over the port site.  Your health care provider uses a sterile technique to access the port. ? Your health care provider must put on a mask and sterile gloves. ? The skin over your port is cleaned carefully with an antiseptic and allowed to dry. ? The port is gently pinched between sterile gloves, and a needle is inserted into the port.  Only "non-coring" port needles should be used to access the port. Once the port is accessed, a blood return should be checked. This helps ensure that the port  is in the vein and is not clogged.  If your port needs to remain accessed for a constant infusion, a clear (transparent) bandage will be placed over the needle site. The bandage and needle will need to be changed every week, or as directed by your health care provider.  Keep the bandage covering the needle clean and dry. Do not get it wet. Follow your health care provider's instructions on how to take a shower or bath while the port is accessed.  If your port does not need to stay accessed, no bandage is needed over the port.  What is flushing? Flushing helps keep the port from getting clogged. Follow your health care provider's instructions on how and when to flush the port. Ports are usually flushed with saline solution or a medicine called heparin. The need for flushing will depend on how the port is used.  If the port is used for intermittent medicines or blood draws, the port will need to be flushed: ? After medicines have been given. ? After blood has been drawn. ? As part of routine maintenance.  If a constant infusion is running, the port may not need to be flushed.  How long will my port stay implanted? The port can stay in for as long as your health care provider thinks it is needed. When it is time for the port to come out, surgery will be   done to remove it. The procedure is similar to the one performed when the port was put in. When should I seek immediate medical care? When you have an implanted port, you should seek immediate medical care if:  You notice a bad smell coming from the incision site.  You have swelling, redness, or drainage at the incision site.  You have more swelling or pain at the port site or the surrounding area.  You have a fever that is not controlled with medicine.  This information is not intended to replace advice given to you by your health care provider. Make sure you discuss any questions you have with your health care provider. Document  Released: 03/29/2005 Document Revised: 09/04/2015 Document Reviewed: 12/04/2012 Elsevier Interactive Patient Education  2017 [employer]  

## 2017-05-05 ENCOUNTER — Encounter (HOSPITAL_COMMUNITY): Payer: Self-pay

## 2017-05-05 DIAGNOSIS — H26493 Other secondary cataract, bilateral: Secondary | ICD-10-CM | POA: Diagnosis not present

## 2017-05-06 ENCOUNTER — Encounter: Payer: Self-pay | Admitting: Hematology & Oncology

## 2017-05-11 ENCOUNTER — Encounter: Payer: Self-pay | Admitting: Neurology

## 2017-05-14 ENCOUNTER — Other Ambulatory Visit: Payer: Self-pay

## 2017-05-14 ENCOUNTER — Encounter (HOSPITAL_COMMUNITY): Payer: Self-pay

## 2017-05-14 ENCOUNTER — Emergency Department (HOSPITAL_COMMUNITY): Payer: PPO

## 2017-05-14 ENCOUNTER — Emergency Department (HOSPITAL_COMMUNITY)
Admission: EM | Admit: 2017-05-14 | Discharge: 2017-05-14 | Disposition: A | Discharge status: Home / Self Care | Payer: PPO | Attending: Emergency Medicine | Admitting: Emergency Medicine

## 2017-05-14 DIAGNOSIS — C772 Secondary and unspecified malignant neoplasm of intra-abdominal lymph nodes: Secondary | ICD-10-CM | POA: Diagnosis not present

## 2017-05-14 DIAGNOSIS — Z96642 Presence of left artificial hip joint: Secondary | ICD-10-CM | POA: Insufficient documentation

## 2017-05-14 DIAGNOSIS — Z96653 Presence of artificial knee joint, bilateral: Secondary | ICD-10-CM | POA: Insufficient documentation

## 2017-05-14 DIAGNOSIS — C679 Malignant neoplasm of bladder, unspecified: Secondary | ICD-10-CM | POA: Diagnosis not present

## 2017-05-14 DIAGNOSIS — I10 Essential (primary) hypertension: Secondary | ICD-10-CM | POA: Diagnosis not present

## 2017-05-14 DIAGNOSIS — Z87891 Personal history of nicotine dependence: Secondary | ICD-10-CM | POA: Insufficient documentation

## 2017-05-14 DIAGNOSIS — R509 Fever, unspecified: Secondary | ICD-10-CM | POA: Diagnosis not present

## 2017-05-14 DIAGNOSIS — Z79899 Other long term (current) drug therapy: Secondary | ICD-10-CM | POA: Diagnosis not present

## 2017-05-14 LAB — URINALYSIS, ROUTINE W REFLEX MICROSCOPIC
Bilirubin Urine: NEGATIVE
Glucose, UA: NEGATIVE mg/dL
Hgb urine dipstick: NEGATIVE
Ketones, ur: NEGATIVE mg/dL
Leukocytes, UA: NEGATIVE
Nitrite: NEGATIVE
Protein, ur: NEGATIVE mg/dL
Specific Gravity, Urine: 1.02 (ref 1.005–1.030)
pH: 7 (ref 5.0–8.0)

## 2017-05-14 LAB — CBC WITH DIFFERENTIAL/PLATELET
Basophils Absolute: 0 10*3/uL (ref 0.0–0.1)
Basophils Relative: 0 %
Eosinophils Absolute: 0 10*3/uL (ref 0.0–0.7)
Eosinophils Relative: 0 %
HCT: 40 % (ref 39.0–52.0)
Hemoglobin: 13.6 g/dL (ref 13.0–17.0)
Lymphocytes Relative: 23 %
Lymphs Abs: 0.8 10*3/uL (ref 0.7–4.0)
MCH: 30.4 pg (ref 26.0–34.0)
MCHC: 34 g/dL (ref 30.0–36.0)
MCV: 89.3 fL (ref 78.0–100.0)
Monocytes Absolute: 0.4 10*3/uL (ref 0.1–1.0)
Monocytes Relative: 10 %
Neutro Abs: 2.5 10*3/uL (ref 1.7–7.7)
Neutrophils Relative %: 67 %
Platelets: 105 10*3/uL — ABNORMAL LOW (ref 150–400)
RBC: 4.48 MIL/uL (ref 4.22–5.81)
RDW: 12.8 % (ref 11.5–15.5)
WBC: 3.7 10*3/uL — ABNORMAL LOW (ref 4.0–10.5)

## 2017-05-14 LAB — I-STAT CG4 LACTIC ACID, ED
Lactic Acid, Venous: 1.33 mmol/L (ref 0.5–1.9)
Lactic Acid, Venous: 1.2 mmol/L (ref 0.5–1.9)

## 2017-05-14 LAB — COMPREHENSIVE METABOLIC PANEL
ALT: 23 U/L (ref 17–63)
AST: 31 U/L (ref 15–41)
Albumin: 4 g/dL (ref 3.5–5.0)
Alkaline Phosphatase: 65 U/L (ref 38–126)
Anion gap: 10 (ref 5–15)
BUN: 21 mg/dL — ABNORMAL HIGH (ref 6–20)
CO2: 28 mmol/L (ref 22–32)
Calcium: 9 mg/dL (ref 8.9–10.3)
Chloride: 97 mmol/L — ABNORMAL LOW (ref 101–111)
Creatinine, Ser: 1.33 mg/dL — ABNORMAL HIGH (ref 0.61–1.24)
GFR calc Af Amer: >60 mL/min (ref >60)
GFR calc non Af Amer: 55 mL/min — ABNORMAL LOW (ref >60)
Glucose, Bld: 112 mg/dL — ABNORMAL HIGH (ref 65–99)
Potassium: 4.2 mmol/L (ref 3.5–5.1)
Sodium: 135 mmol/L (ref 135–145)
Total Bilirubin: 0.6 mg/dL (ref 0.3–1.2)
Total Protein: 6.9 g/dL (ref 6.5–8.1)

## 2017-05-14 LAB — INFLUENZA PANEL BY PCR (TYPE A & B)
Influenza A By PCR: NEGATIVE
Influenza B By PCR: NEGATIVE

## 2017-05-14 NOTE — Discharge Instructions (Signed)
We spoke to Dr Lebron Conners (oncology) who recommends discharge today and close follow up with your oncologist on Monday.  Resume all medications as prescribed. Return to the ED for any other new symptoms. Your flu test was negative today.

## 2017-05-14 NOTE — ED Provider Notes (Signed)
[facility] DEPT Provider Note   CSN: [id number] Arrival date & time: 05/14/17  1843     History   Chief Complaint Chief Complaint  Patient presents with  . Fever    Cancer Patient    HPI [name] Rose is a 67 y.o. 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 States he felt "achy" and decided to check his temperature and noted fever. He denies congestion, sore throat, chest pain, cough, shortness of breath, nausea, vomiting, abdominal pain, dysuria, hematuria, constipation or diarrhea. States he otherwise feels at baseline. No interventions PTA. HPI  Past Medical History:  Diagnosis Date  . Arthritis   . 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 ([place]) 09/29/2016  . Bladder tumor   . Goals of care, counseling/discussion 09/30/2016  . History of prostate cancer followed by pcp dr [name]-  per pt last PSA undetectable   dx 2008-- (Stage T1c, Gleason 3+3,  PSA 4.58, vol 99cc)  s/p  radical prostatectomy (nerve sparing bilateral)   . Hypertension   . Lower urinary tract symptoms (LUTS)   . Pre-diabetes   . Wears glasses     Patient Active Problem List   Diagnosis Date Noted  . 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 Anorexia 10/17/2016  . Diarrhea 10/17/2016  . LFT elevation 10/17/2016  . Dehydration 10/17/2016  . Fatigue 10/17/2016  . Hypocalcemia 10/17/2016  . Goals of care, counseling/discussion 09/30/2016  . 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 ([place]) 09/29/2016  . Cancer of dome of urinary bladder ([place]) 09/20/2016  . Postoperative anemia due to acute blood loss 03/04/2015  . Hypokalemia 03/04/2015  . 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 CATARACT EXTRACTION W/ INTRAOCULAR LENS   IMPLANT, BILATERAL Bilateral 2011  . [place]  . IR FLUORO GUIDE PORT INSERTION RIGHT  10/07/2016  . IR US GUIDE VASC ACCESS RIGHT  10/07/2016  . KNEE ARTHROSCOPY Bilateral right 2006;  left 02-15-2007  . [place];  1990;  1983  . ROBOT ASSISTED LAPAROSCOPIC RADICAL PROSTATECTOMY  06/20/2006   bilateral nerve sparing  . TOTAL HIP ARTHROPLASTY Left 03/03/2015   Procedure: LEFT TOTAL HIP ARTHROPLASTY ANTERIOR APPROACH;  Surgeon: Dorna Leitz, MD;  Location: [facility];  Service: Orthopedics;  Laterality: Left;  . TOTAL KNEE ARTHROPLASTY Bilateral left 08-13-2009;  right 12-26-2009  . TRANSURETHRAL RESECTION OF BLADDER TUMOR N/A 09/20/2016   Procedure: TRANSURETHRAL RESECTION OF BLADDER TUMOR (TURBT);  Surgeon: Franchot Gallo, MD;  Location: [facility];  Service: Urology;  Laterality: N/A;       Home Medications    Prior to Admission medications   Medication Sig Start Date End Date Taking? Authorizing Provider  amLODipine (NORVASC) 10 MG tablet Take 10 mg by mouth every morning.   Yes [provider]  bisoprolol-hydrochlorothiazide (ZIAC) 10-6.25 MG tablet Take 1 tablet by mouth daily.    Yes [provider]  Cinnamon 500 MG TABS Take 1 tablet by mouth 2 (two) times daily.    Yes [provider]  gabapentin (NEURONTIN) 100 MG capsule Take 2 capsules (200 mg total) by mouth 3 (three) times daily. 05/03/17  Yes Volanda Napoleon, MD  losartan (COZAAR) 100 MG tablet Take 100 mg by mouth daily.  Yes [provider]  MAGNESIUM PO Take 400 mg by mouth at bedtime. Leg cramps   Yes [provider]  Naproxen Sodium (ALEVE) 220 MG CAPS Take 220 mg by mouth 2 (two) times daily.    Yes [provider]  LORazepam (ATIVAN) 0.5 MG tablet TAKE 1 TABLET BY MOUTH EVERY 6 HOURS AS NEEDED FOR NAUSEA AND VOMITING Patient not taking: Reported on 04/25/2017 02/21/17   Volanda Napoleon, MD  ondansetron (ZOFRAN) 8  MG tablet TAKE 1 TABLET BY MOUTH TWICE A DAY START 3RD DAY AFTER CHEMO Patient not taking: Reported on 04/25/2017 01/10/17   Volanda Napoleon, MD  prochlorperazine (COMPAZINE) 10 MG tablet TAKE 1 TABLET (10 MG TOTAL) BY MOUTH EVERY 6 (SIX) HOURS AS NEEDED (NAUSEA OR VOMITING). Patient not taking: Reported on 04/25/2017 01/26/17   Volanda Napoleon, MD    Family History History reviewed. No pertinent family history.  Social History Social History   Tobacco Use  . Smoking status: Former Smoker    Years: 16.00    Types: Cigarettes    Last attempt to quit: 09/25/1984    Years since quitting: 32.6  . Smokeless tobacco: Never Used  Substance Use Topics  . Alcohol use: Yes    Comment: occasionally  . Drug use: No     Allergies   Patient has no known allergies.   Review of Systems Review of Systems  Constitutional: Positive for fever.  Allergic/Immunologic: Positive for immunocompromised state.  All other systems reviewed and are negative.    Physical Exam Updated Vital Signs BP (!) 101/51 (BP Location: Right Arm)   Pulse 76   Temp 98.6 F (37 C) (Oral)   Resp 17   SpO2 96%   Physical Exam  Constitutional: He is oriented to person, place, and time. He appears well-developed and well-nourished. No distress.  NAD. Well-appearing.  HENT:  Head: Normocephalic and atraumatic.  Right Ear: External ear normal.  Left Ear: External ear normal.  Nose: Nose normal.  No mucosal edema, oropharynx and tonsils normal. Moist mucous membranes.  Eyes: Conjunctivae and EOM are normal. No scleral icterus.  Neck: Normal range of motion. Neck supple.  No cervical adenopathy.  Cardiovascular: Normal rate, regular rhythm, normal heart sounds and intact distal pulses.  No murmur heard. Pulmonary/Chest: Effort normal and breath sounds normal. He has no wheezes.  Abdominal: Soft. There is no tenderness.  No suprapubic or CVA tenderness.  Musculoskeletal: Normal range of motion. He exhibits no  deformity.  Neurological: He is alert and oriented to person, place, and time.  Skin: Skin is warm and dry. Capillary refill takes less than 2 seconds.  Psychiatric: He has a normal mood and affect. His behavior is normal. Judgment and thought content normal.  Nursing note and vitals reviewed.    ED Treatments / Results  Labs (all labs ordered are listed, but only abnormal results are displayed) Labs Reviewed  COMPREHENSIVE METABOLIC PANEL - Abnormal; Notable for the following components:      Result Value   Chloride 97 (*)    Glucose, Bld 112 (*)    BUN 21 (*)    Creatinine, Ser 1.33 (*)    GFR calc non Af Amer 55 (*)    All other components within normal limits  CBC WITH DIFFERENTIAL/PLATELET - Abnormal; Notable for the following components:   WBC 3.7 (*)    Platelets 105 (*)    All other components within normal limits  CULTURE, BLOOD (ROUTINE X 2)  CULTURE, BLOOD (ROUTINE X 2)  URINE CULTURE  URINALYSIS, ROUTINE W REFLEX MICROSCOPIC  INFLUENZA PANEL BY PCR (TYPE A & B)  I-STAT CG4 LACTIC ACID, ED  I-STAT CG4 LACTIC ACID, ED    EKG  EKG Interpretation  Date/Time:  Saturday May 14 2017 19:39:23 EST Ventricular Rate:  89 PR Interval:    QRS Duration: 98 QT Interval:  346 QTC Calculation: 421 R Axis:   32 Text Interpretation:  nsr Premature ventricular complexes Ventricular premature complex Confirmed by Pattricia Boss [phone]) on 05/15/2017 10:45:12 AM       Radiology Dg Chest 2 View  Result Date: 05/14/2017 CLINICAL DATA:  Fever.  Undergoing chemotherapy for bladder cancer. EXAM: CHEST  2 VIEW COMPARISON:  10/16/2016. FINDINGS: Normal sized heart. Clear lungs. Stable right jugular porta catheter. Thoracic spine degenerative changes. Bilateral nipple shadows. IMPRESSION: No acute abnormality. Electronically Signed   By: Claudie Revering M.D.   On: 05/14/2017 20:06    Procedures Procedures (including critical care time)  Medications Ordered in ED Medications - No  data to display   Initial Impression / Assessment and Plan / ED Course  I have reviewed the triage vital signs and the nursing notes.  Pertinent labs & imaging results that were available during my care of the patient were reviewed by me and considered in my medical decision making (see chart for details).     We will get screening labs, chest x-ray, urinalysis, flu swabs to look for a possible infectious etiology. Patient did recently start a new immunotherapy, question possible side effect. We'll need to contact oncology for recommendations as needed.  Final Clinical Impressions(s) / ED Diagnoses   Labs unremarkable. No neutropenia. Spoke to Dr. Lebron Conners who recommends discharge with close f/u with oncologist on Monday. Strict ED return precautions given. Pt shared with SP.  Final diagnoses:  Fever in adult    ED Discharge Orders    None       Arlean Hopping 05/15/17 1505    Duffy Bruce, MD 05/15/17 [phone]

## 2017-05-14 NOTE — ED Triage Notes (Addendum)
Pt is complaining of a fever of 101.4 at home today. He is a cancer patient currently being treated for metastasized (to lymph nodes) bladder cancer. Reports some fatigue, but nothing too concerning to him other than the fever. A&Ox4. Last treatment was last Tuesday.

## 2017-05-15 NOTE — ED Provider Notes (Signed)
Medical screening examination/treatment/procedure(s) were conducted as a shared visit with non-physician practitioner(s) and myself.  I personally evaluated the patient during the encounter. Briefly, the patient is a 67 yo M with h/o metastatic bladder CA on immunotherapy here with transient fever today, now resolved spontaneously. No other sx. On exam, he is very well appearing and in NAD. Lungs CTAB. CXR neg. LA normal x 2. He is not neutropenic. UA without signs of UTI. No abdominal TTP. No rash. Port site c/d/i. DIscussed with Oncology, will d/c with outpt follow-up. Cultures all sent. Return precautions given..   EKG Interpretation None          Duffy Bruce, MD 05/15/17 1040

## 2017-05-16 LAB — URINE CULTURE
Culture: NO GROWTH
Special Requests: NORMAL

## 2017-05-20 LAB — CULTURE, BLOOD (ROUTINE X 2)
Culture: NO GROWTH
Culture: NO GROWTH
Special Requests: ADEQUATE

## 2017-05-24 ENCOUNTER — Inpatient Hospital Stay: Payer: PPO

## 2017-05-24 ENCOUNTER — Inpatient Hospital Stay: Payer: PPO | Attending: Hematology & Oncology | Admitting: Hematology & Oncology

## 2017-05-24 VITALS — BP 106/69 | HR 77 | Temp 98.4°F | Resp 18

## 2017-05-24 DIAGNOSIS — C679 Malignant neoplasm of bladder, unspecified: Secondary | ICD-10-CM | POA: Diagnosis not present

## 2017-05-24 DIAGNOSIS — C772 Secondary and unspecified malignant neoplasm of intra-abdominal lymph nodes: Secondary | ICD-10-CM | POA: Diagnosis not present

## 2017-05-24 DIAGNOSIS — Z5112 Encounter for antineoplastic immunotherapy: Secondary | ICD-10-CM | POA: Diagnosis not present

## 2017-05-24 DIAGNOSIS — Z79899 Other long term (current) drug therapy: Secondary | ICD-10-CM | POA: Diagnosis not present

## 2017-05-24 LAB — CBC WITH DIFFERENTIAL (CANCER CENTER ONLY)
Basophils Absolute: 0 10*3/uL (ref 0.0–0.1)
Basophils Relative: 0 %
Eosinophils Absolute: 0.2 10*3/uL (ref 0.0–0.5)
Eosinophils Relative: 2 %
HCT: 35.7 % — ABNORMAL LOW (ref 38.7–49.9)
Hemoglobin: 12 g/dL — ABNORMAL LOW (ref 13.0–17.1)
Lymphocytes Relative: 43 %
Lymphs Abs: 3 10*3/uL (ref 0.9–3.3)
MCH: 30.1 pg (ref 28.0–33.4)
MCHC: 33.6 g/dL (ref 32.0–35.9)
MCV: 89.5 fL (ref 82.0–98.0)
Monocytes Absolute: 0.5 10*3/uL (ref 0.1–0.9)
Monocytes Relative: 8 %
Neutro Abs: 3.3 10*3/uL (ref 1.5–6.5)
Neutrophils Relative %: 47 %
Platelet Count: 188 10*3/uL (ref 145–400)
RBC: 3.99 MIL/uL — ABNORMAL LOW (ref 4.20–5.70)
RDW: 12.8 % (ref 11.1–15.7)
WBC Count: 7.1 10*3/uL (ref 4.0–10.0)

## 2017-05-24 LAB — CMP (CANCER CENTER ONLY)
ALT: 26 U/L (ref 0–55)
AST: 30 U/L (ref 5–34)
Albumin: 3.6 g/dL (ref 3.5–5.0)
Alkaline Phosphatase: 65 U/L (ref 26–84)
Anion gap: 4 — ABNORMAL LOW (ref 5–15)
BUN: 30 mg/dL — ABNORMAL HIGH (ref 7–22)
CO2: 32 mmol/L (ref 18–33)
Calcium: 9.3 mg/dL (ref 8.0–10.3)
Chloride: 103 mmol/L (ref 98–108)
Creatinine: 1.5 mg/dL — ABNORMAL HIGH (ref 0.70–1.30)
Glucose, Bld: 180 mg/dL — ABNORMAL HIGH (ref 73–118)
Potassium: 4.3 mmol/L (ref 3.3–4.7)
Sodium: 139 mmol/L (ref 128–145)
Total Bilirubin: 0.7 mg/dL (ref 0.2–1.2)
Total Protein: 6.6 g/dL (ref 6.4–8.1)

## 2017-05-24 LAB — LACTATE DEHYDROGENASE: LDH: 251 U/L — ABNORMAL HIGH (ref 125–245)

## 2017-05-24 LAB — TSH: TSH: 1.431 u[IU]/mL (ref 0.320–4.118)

## 2017-05-24 MED ORDER — HEPARIN SOD (PORK) LOCK FLUSH 100 UNIT/ML IV SOLN
500.0000 [IU] | Freq: Once | INTRAVENOUS | Status: AC | PRN
  Administered 2017-05-24: 500 [IU]
  Filled 2017-05-24: qty 5

## 2017-05-24 MED ORDER — ATEZOLIZUMAB CHEMO INJECTION 1200 MG/20ML
1200.0000 mg | Freq: Once | INTRAVENOUS | Status: AC
  Administered 2017-05-24: 1200 mg via INTRAVENOUS
  Filled 2017-05-24: qty 20

## 2017-05-24 MED ORDER — SODIUM CHLORIDE 0.9 % IV SOLN
Freq: Once | INTRAVENOUS | Status: AC
  Administered 2017-05-24: 12:00:00 via INTRAVENOUS

## 2017-05-24 MED ORDER — VITAMIN B-6 250 MG PO TABS: 250.0000 mg | ORAL_TABLET | Freq: Every day | ORAL | 6 refills | Status: DC

## 2017-05-24 MED ORDER — SODIUM CHLORIDE 0.9% FLUSH
10.0000 mL | INTRAVENOUS | Status: DC | PRN
  Administered 2017-05-24: 10 mL
  Filled 2017-05-24: qty 10

## 2017-05-24 NOTE — Progress Notes (Signed)
Hematology and Oncology Follow Up Visit  [name] Rose [id number] 05/31/1950 66 y.o. 05/24/2017   Principle Diagnosis:  Metastatic high-grade bladder cancer -- recurrent  Current Therapy:  Atezolizumab 1200mg IV q 3 wks - s/p cycle #1     Interim History:  [name] Rose is here today for follow-up.  Everything so far is doing well.  He really had no problems with the first cycle of Tecentriq.  He has had no cough or shortness of breath.  He actually played golf last week.  He did not shoot all that well.  However, he did enjoyed himself.  He has had neuropathy in his feet.  He is on gabapentin.  He takes 200 mg 3 times a day.  I will add some vitamin B6.  He has had no diarrhea.  He has had no bleeding.  He has had no nausea or vomiting.  Overall, his performance status is ECOG 1.   Medications:  Allergies as of 05/24/2017   No Known Allergies     Medication List        Accurate as of 05/24/17 11:06 AM. Always use your most recent med list.          ALEVE 220 MG Caps Generic drug:  Naproxen Sodium Take 220 mg by mouth 2 (two) times daily.   amLODipine 10 MG tablet Commonly known as:  NORVASC Take 10 mg by mouth every morning.   bisoprolol-hydrochlorothiazide 10-6.25 MG tablet Commonly known as:  ZIAC Take 1 tablet by mouth daily.   Cinnamon 500 MG Tabs Take 1 tablet by mouth 2 (two) times daily.   gabapentin 100 MG capsule Commonly known as:  NEURONTIN Take 2 capsules (200 mg total) by mouth 3 (three) times daily.   LORazepam 0.5 MG tablet Commonly known as:  ATIVAN TAKE 1 TABLET BY MOUTH EVERY 6 HOURS AS NEEDED FOR NAUSEA AND VOMITING   losartan 100 MG tablet Commonly known as:  COZAAR Take 100 mg by mouth daily.   MAGNESIUM PO Take 400 mg by mouth at bedtime. Leg cramps   ondansetron 8 MG tablet Commonly known as:  ZOFRAN TAKE 1 TABLET BY MOUTH TWICE A DAY START 3RD DAY AFTER CHEMO   prochlorperazine 10 MG tablet Commonly known as:   COMPAZINE TAKE 1 TABLET (10 MG TOTAL) BY MOUTH EVERY 6 (SIX) HOURS AS NEEDED (NAUSEA OR VOMITING).       Allergies: No Known Allergies  Past Medical History, Surgical history, Social history, and Family History were reviewed and updated.  Review of Systems: Review of Systems  Constitutional: Negative.   HENT: Negative.   Eyes: Negative.   Respiratory: Negative.   Cardiovascular: Negative.   Gastrointestinal: Negative.   Genitourinary: Negative.   Musculoskeletal: Negative.   Skin: Negative.   Neurological: Negative.   Endo/Heme/Allergies: Negative.   Psychiatric/Behavioral: Negative.      Physical Exam:  oral temperature is 98.4 F (36.9 C). His blood pressure is 106/69 and his pulse is 77. His respiration is 18 and oxygen saturation is 98%.   Wt Readings from Last 3 Encounters:  04/25/17 275 lb 5 oz (124.9 kg)  04/25/17 275 lb (124.7 kg)  04/21/17 265 lb (120.2 kg)    Physical Exam  Constitutional: He is oriented to person, place, and time.  HENT:  Head: Normocephalic and atraumatic.  Mouth/Throat: Oropharynx is clear and moist.  Eyes: EOM are normal. Pupils are equal, round, and reactive to light.  Neck: Normal range of motion.  Cardiovascular: Normal   rate, regular rhythm and normal heart sounds.  Pulmonary/Chest: Effort normal and breath sounds normal.  Abdominal: Soft. Bowel sounds are normal.  Musculoskeletal: Normal range of motion. He exhibits no edema, tenderness or deformity.  Lymphadenopathy:    He has no cervical adenopathy.  Neurological: He is alert and oriented to person, place, and time.  Skin: Skin is warm and dry. No rash noted. No erythema.  Psychiatric: He has a normal mood and affect. His behavior is normal. Judgment and thought content normal.  Vitals reviewed.   Lab Results  Component Value Date   WBC 7.1 05/24/2017   HGB 13.6 05/14/2017   HCT 35.7 (L) 05/24/2017   MCV 89.5 05/24/2017   PLT 188 05/24/2017   No results found for:  FERRITIN, IRON, TIBC, UIBC, IRONPCTSAT Lab Results  Component Value Date   RBC 3.99 (L) 05/24/2017   No results found for: KPAFRELGTCHN, LAMBDASER, KAPLAMBRATIO No results found for: IGGSERUM, IGA, IGMSERUM No results found for: Ronnald Ramp, A1GS, A2GS, Violet Baldy, MSPIKE, SPEI   Chemistry      Component Value Date/Time   NA 139 05/24/2017 1000   NA 141 02/21/2017 0902   K 4.3 05/24/2017 1000   K 4.1 02/21/2017 0902   CL 103 05/24/2017 1000   CL 98 02/21/2017 0902   CO2 32 05/24/2017 1000   CO2 31 02/21/2017 0902   BUN 30 (H) 05/24/2017 1000   BUN 17 02/21/2017 0902   CREATININE 1.50 (H) 05/24/2017 1000   CREATININE 1.2 02/21/2017 0902      Component Value Date/Time   CALCIUM 9.3 05/24/2017 1000   CALCIUM 9.2 02/21/2017 0902   ALKPHOS 65 05/24/2017 1000   ALKPHOS 65 02/21/2017 0902   AST 30 05/24/2017 1000   ALT 26 05/24/2017 1000   ALT 18 02/21/2017 0902   BILITOT 0.7 05/24/2017 1000      Impression and Plan: [name] Rose is a very pleasant 67 yo caucasian gentleman with metastatic high grade bladder cancer. He has done well with 6 cycles of Cisplatin/Gemcitabine.  He has last cycle back in October.  We will go ahead with his second cycle of Tecentriq.  We will get him back in 3 weeks for his third cycle.  After 3 cycles, we will plan for a follow-up PET scan.  Of note, we did send off molecular studies.  He does have a high TMB value.  As such, he should respond to immunotherapy.   Volanda Napoleon, MD 2/12/201911:06 AM

## 2017-05-24 NOTE — Patient Instructions (Signed)
[facility] Discharge Instructions for Patients Receiving Chemotherapy  Today you received the following chemotherapy agent Tecentriq.  To help prevent nausea and vomiting after your treatment, we encourage you to take your nausea medication as instructed by your physician.   If you develop nausea and vomiting that is not controlled by your nausea medication, call the clinic.   BELOW ARE SYMPTOMS THAT SHOULD BE REPORTED IMMEDIATELY:  *FEVER GREATER THAN 100.5 F  *CHILLS WITH OR WITHOUT FEVER  NAUSEA AND VOMITING THAT IS NOT CONTROLLED WITH YOUR NAUSEA MEDICATION  *UNUSUAL SHORTNESS OF BREATH  *UNUSUAL BRUISING OR BLEEDING  TENDERNESS IN MOUTH AND THROAT WITH OR WITHOUT PRESENCE OF ULCERS  *URINARY PROBLEMS  *BOWEL PROBLEMS  UNUSUAL RASH Items with * indicate a potential emergency and should be followed up as soon as possible.  Feel free to call the clinic should you have any questions or concerns. The clinic phone number is [phone].  Please show the [facility] at check-in to the Emergency Department and triage nurse.

## 2017-05-24 NOTE — Addendum Note (Signed)
Addended by: Burney Gauze R on: 05/24/2017 12:17 PM   Modules accepted: Orders

## 2017-05-25 ENCOUNTER — Encounter: Payer: Self-pay | Admitting: Hematology & Oncology

## 2017-06-13 ENCOUNTER — Other Ambulatory Visit: Payer: Self-pay | Admitting: *Deleted

## 2017-06-13 DIAGNOSIS — C671 Malignant neoplasm of dome of bladder: Secondary | ICD-10-CM

## 2017-06-14 ENCOUNTER — Inpatient Hospital Stay: Payer: PPO

## 2017-06-14 ENCOUNTER — Inpatient Hospital Stay: Payer: PPO | Attending: Hematology & Oncology

## 2017-06-14 DIAGNOSIS — C772 Secondary and unspecified malignant neoplasm of intra-abdominal lymph nodes: Secondary | ICD-10-CM | POA: Diagnosis not present

## 2017-06-14 DIAGNOSIS — G629 Polyneuropathy, unspecified: Secondary | ICD-10-CM | POA: Insufficient documentation

## 2017-06-14 DIAGNOSIS — C679 Malignant neoplasm of bladder, unspecified: Secondary | ICD-10-CM | POA: Insufficient documentation

## 2017-06-14 DIAGNOSIS — Z79899 Other long term (current) drug therapy: Secondary | ICD-10-CM | POA: Insufficient documentation

## 2017-06-14 DIAGNOSIS — Z5112 Encounter for antineoplastic immunotherapy: Secondary | ICD-10-CM | POA: Insufficient documentation

## 2017-06-14 DIAGNOSIS — C671 Malignant neoplasm of dome of bladder: Secondary | ICD-10-CM

## 2017-06-14 DIAGNOSIS — Z95828 Presence of other vascular implants and grafts: Secondary | ICD-10-CM

## 2017-06-14 LAB — CMP (CANCER CENTER ONLY)
ALT: 27 U/L (ref 10–47)
AST: 34 U/L (ref 11–38)
Albumin: 3.5 g/dL (ref 3.5–5.0)
Alkaline Phosphatase: 59 U/L (ref 26–84)
Anion gap: 10 (ref 5–15)
BUN: 24 mg/dL — ABNORMAL HIGH (ref 7–22)
CO2: 29 mmol/L (ref 18–33)
Calcium: 9 mg/dL (ref 8.0–10.3)
Chloride: 102 mmol/L (ref 98–108)
Creatinine: 1.6 mg/dL — ABNORMAL HIGH (ref 0.60–1.20)
Glucose, Bld: 232 mg/dL — ABNORMAL HIGH (ref 73–118)
Potassium: 4 mmol/L (ref 3.3–4.7)
Sodium: 141 mmol/L (ref 128–145)
Total Bilirubin: 0.7 mg/dL (ref 0.2–1.6)
Total Protein: 6.2 g/dL — ABNORMAL LOW (ref 6.4–8.1)

## 2017-06-14 LAB — CBC WITH DIFFERENTIAL (CANCER CENTER ONLY)
Basophils Absolute: 0 10*3/uL (ref 0.0–0.1)
Basophils Relative: 0 %
Eosinophils Absolute: 0.2 10*3/uL (ref 0.0–0.5)
Eosinophils Relative: 4 %
HCT: 35.1 % — ABNORMAL LOW (ref 38.7–49.9)
Hemoglobin: 11.9 g/dL — ABNORMAL LOW (ref 13.0–17.1)
Lymphocytes Relative: 41 %
Lymphs Abs: 2.4 10*3/uL (ref 0.9–3.3)
MCH: 30.3 pg (ref 28.0–33.4)
MCHC: 33.9 g/dL (ref 32.0–35.9)
MCV: 89.3 fL (ref 82.0–98.0)
Monocytes Absolute: 0.4 10*3/uL (ref 0.1–0.9)
Monocytes Relative: 7 %
Neutro Abs: 2.7 10*3/uL (ref 1.5–6.5)
Neutrophils Relative %: 48 %
Platelet Count: 145 10*3/uL (ref 145–400)
RBC: 3.93 MIL/uL — ABNORMAL LOW (ref 4.20–5.70)
RDW: 13.8 % (ref 11.1–15.7)
WBC Count: 5.8 10*3/uL (ref 4.0–10.0)

## 2017-06-14 MED ORDER — SODIUM CHLORIDE 0.9% FLUSH
10.0000 mL | INTRAVENOUS | Status: DC | PRN
  Filled 2017-06-14: qty 10

## 2017-06-14 MED ORDER — SODIUM CHLORIDE 0.9 % IV SOLN
1200.0000 mg | Freq: Once | INTRAVENOUS | Status: AC
  Administered 2017-06-14: 1200 mg via INTRAVENOUS
  Filled 2017-06-14: qty 20

## 2017-06-14 MED ORDER — HEPARIN SOD (PORK) LOCK FLUSH 100 UNIT/ML IV SOLN
500.0000 [IU] | Freq: Once | INTRAVENOUS | Status: DC
  Filled 2017-06-14: qty 5

## 2017-06-14 MED ORDER — HEPARIN SOD (PORK) LOCK FLUSH 100 UNIT/ML IV SOLN
500.0000 [IU] | Freq: Once | INTRAVENOUS | Status: AC | PRN
  Administered 2017-06-14: 500 [IU]
  Filled 2017-06-14: qty 5

## 2017-06-14 MED ORDER — SODIUM CHLORIDE 0.9% FLUSH
10.0000 mL | INTRAVENOUS | Status: DC | PRN
  Administered 2017-06-14: 10 mL
  Filled 2017-06-14: qty 10

## 2017-06-14 MED ORDER — SODIUM CHLORIDE 0.9 % IV SOLN
Freq: Once | INTRAVENOUS | Status: AC
  Administered 2017-06-14: 10:00:00 via INTRAVENOUS

## 2017-06-14 NOTE — Patient Instructions (Signed)
Atezolizumab injection What is this medicine? ATEZOLIZUMAB (a te zoe LIZ ue mab) is a monoclonal antibody. It is used to treat bladder cancer (urothelial cancer) and non-small cell lung cancer. This medicine may be used for other purposes; ask your health care provider or pharmacist if you have questions. COMMON BRAND NAME(S): Tecentriq What should I tell my health care provider before I take this medicine? They need to know if you have any of these conditions: -diabetes -immune system problems -infection -inflammatory bowel disease -liver disease -lung or breathing disease -lupus -nervous system problems like myasthenia gravis or Guillain-Barre syndrome -organ transplant -an unusual or allergic reaction to atezolizumab, other medicines, foods, dyes, or preservatives -pregnant or trying to get pregnant -breast-feeding How should I use this medicine? This medicine is for infusion into a vein. It is given by a health care professional in a hospital or clinic setting. A special MedGuide will be given to you before each treatment. Be sure to read this information carefully each time. Talk to your pediatrician regarding the use of this medicine in children. Special care may be needed. Overdosage: If you think you have taken too much of this medicine contact a poison control center or emergency room at once. NOTE: This medicine is only for you. Do not share this medicine with others. What if I miss a dose? It is important not to miss your dose. Call your doctor or health care professional if you are unable to keep an appointment. What may interact with this medicine? Interactions have not been studied. This list may not describe all possible interactions. Give your health care provider a list of all the medicines, herbs, non-prescription drugs, or dietary supplements you use. Also tell them if you smoke, drink alcohol, or use illegal drugs. Some items may interact with your medicine. What  should I watch for while using this medicine? Your condition will be monitored carefully while you are receiving this medicine. You may need blood work done while you are taking this medicine. Do not become pregnant while taking this medicine or for at least 5 months after stopping it. Women should inform their doctor if they wish to become pregnant or think they might be pregnant. There is a potential for serious side effects to an unborn child. Talk to your health care professional or pharmacist for more information. Do not breast-feed an infant while taking this medicine or for at least 5 months after the last dose. What side effects may I notice from receiving this medicine? Side effects that you should report to your doctor or health care professional as soon as possible: -allergic reactions like skin rash, itching or hives, swelling of the face, lips, or tongue -black, tarry stools -bloody or watery diarrhea -breathing problems -changes in vision -chest pain or chest tightness -chills -facial flushing -fever -headache -signs and symptoms of high blood sugar such as dizziness; dry mouth; dry skin; fruity breath; nausea; stomach pain; increased hunger or thirst; increased urination -signs and symptoms of liver injury like dark yellow or brown urine; general ill feeling or flu-like symptoms; light-colored stools; loss of appetite; nausea; right upper belly pain; unusually weak or tired; yellowing of the eyes or skin -stomach pain -trouble passing urine or change in the amount of urine Side effects that usually do not require medical attention (report to your doctor or health care professional if they continue or are bothersome): -cough -diarrhea -joint pain -muscle pain -muscle weakness -tiredness -weight loss This list may not describe all  possible side effects. Call your doctor for medical advice about side effects. You may report side effects to FDA at 1-800-FDA-1088. Where should  I keep my medicine? This drug is given in a hospital or clinic and will not be stored at home. NOTE: This sheet is a summary. It may not cover all possible information. If you have questions about this medicine, talk to your doctor, pharmacist, or health care provider.  2018 Elsevier/Gold Standard (2015-04-30 17:54:14)

## 2017-06-14 NOTE — Progress Notes (Signed)
OK to treat with today's blood test results per [name].

## 2017-06-14 NOTE — Patient Instructions (Signed)

## 2017-07-05 ENCOUNTER — Other Ambulatory Visit: Payer: Self-pay

## 2017-07-05 ENCOUNTER — Inpatient Hospital Stay (HOSPITAL_BASED_OUTPATIENT_CLINIC_OR_DEPARTMENT_OTHER): Payer: PPO | Admitting: Hematology & Oncology

## 2017-07-05 ENCOUNTER — Inpatient Hospital Stay: Payer: PPO

## 2017-07-05 ENCOUNTER — Encounter: Payer: Self-pay | Admitting: Hematology & Oncology

## 2017-07-05 VITALS — BP 143/71 | HR 76 | Temp 98.5°F | Resp 18 | Wt 285.0 lb

## 2017-07-05 DIAGNOSIS — C772 Secondary and unspecified malignant neoplasm of intra-abdominal lymph nodes: Secondary | ICD-10-CM

## 2017-07-05 DIAGNOSIS — C679 Malignant neoplasm of bladder, unspecified: Secondary | ICD-10-CM | POA: Diagnosis not present

## 2017-07-05 DIAGNOSIS — Z5112 Encounter for antineoplastic immunotherapy: Secondary | ICD-10-CM

## 2017-07-05 DIAGNOSIS — G629 Polyneuropathy, unspecified: Secondary | ICD-10-CM

## 2017-07-05 DIAGNOSIS — Z79899 Other long term (current) drug therapy: Secondary | ICD-10-CM | POA: Diagnosis not present

## 2017-07-05 LAB — CMP (CANCER CENTER ONLY)
ALT: 26 U/L (ref 10–47)
AST: 34 U/L (ref 11–38)
Albumin: 3.6 g/dL (ref 3.5–5.0)
Alkaline Phosphatase: 60 U/L (ref 26–84)
Anion gap: 5 (ref 5–15)
BUN: 26 mg/dL — ABNORMAL HIGH (ref 7–22)
CO2: 32 mmol/L (ref 18–33)
Calcium: 9.1 mg/dL (ref 8.0–10.3)
Chloride: 102 mmol/L (ref 98–108)
Creatinine: 1.2 mg/dL (ref 0.60–1.20)
Glucose, Bld: 211 mg/dL — ABNORMAL HIGH (ref 73–118)
Potassium: 4.1 mmol/L (ref 3.3–4.7)
Sodium: 139 mmol/L (ref 128–145)
Total Bilirubin: 0.8 mg/dL (ref 0.2–1.6)
Total Protein: 6.6 g/dL (ref 6.4–8.1)

## 2017-07-05 LAB — CBC WITH DIFFERENTIAL (CANCER CENTER ONLY)
Basophils Absolute: 0 10*3/uL (ref 0.0–0.1)
Basophils Relative: 0 %
Eosinophils Absolute: 0.2 10*3/uL (ref 0.0–0.5)
Eosinophils Relative: 2 %
HCT: 36.5 % — ABNORMAL LOW (ref 38.7–49.9)
Hemoglobin: 12.3 g/dL — ABNORMAL LOW (ref 13.0–17.1)
Lymphocytes Relative: 44 %
Lymphs Abs: 2.9 10*3/uL (ref 0.9–3.3)
MCH: 29.9 pg (ref 28.0–33.4)
MCHC: 33.7 g/dL (ref 32.0–35.9)
MCV: 88.6 fL (ref 82.0–98.0)
Monocytes Absolute: 0.6 10*3/uL (ref 0.1–0.9)
Monocytes Relative: 9 %
Neutro Abs: 3 10*3/uL (ref 1.5–6.5)
Neutrophils Relative %: 45 %
Platelet Count: 157 10*3/uL (ref 145–400)
RBC: 4.12 MIL/uL — ABNORMAL LOW (ref 4.20–5.70)
RDW: 13.8 % (ref 11.1–15.7)
WBC Count: 6.7 10*3/uL (ref 4.0–10.0)

## 2017-07-05 LAB — LACTATE DEHYDROGENASE: LDH: 214 U/L (ref 125–245)

## 2017-07-05 MED ORDER — HEPARIN SOD (PORK) LOCK FLUSH 100 UNIT/ML IV SOLN
500.0000 [IU] | Freq: Once | INTRAVENOUS | Status: AC | PRN
  Administered 2017-07-05: 500 [IU]
  Filled 2017-07-05: qty 5

## 2017-07-05 MED ORDER — SODIUM CHLORIDE 0.9 % IV SOLN
Freq: Once | INTRAVENOUS | Status: AC
  Administered 2017-07-05: 11:00:00 via INTRAVENOUS

## 2017-07-05 MED ORDER — SODIUM CHLORIDE 0.9% FLUSH
10.0000 mL | INTRAVENOUS | Status: DC | PRN
  Administered 2017-07-05: 10 mL
  Filled 2017-07-05: qty 10

## 2017-07-05 MED ORDER — SODIUM CHLORIDE 0.9 % IV SOLN
1200.0000 mg | Freq: Once | INTRAVENOUS | Status: AC
  Administered 2017-07-05: 1200 mg via INTRAVENOUS
  Filled 2017-07-05: qty 20

## 2017-07-05 MED ORDER — AMOXICILLIN 500 MG PO TABS: 2000.0000 mg | ORAL_TABLET | Freq: Once | ORAL | 0 refills | Status: DC

## 2017-07-05 NOTE — Patient Instructions (Signed)

## 2017-07-05 NOTE — Progress Notes (Signed)
Hematology and Oncology Follow Up Rose  [name] Rose [id number] 1950-08-29 67 y.o. 07/05/2017   Principle Diagnosis:  Metastatic high-grade bladder cancer -- recurrent  Current Therapy:  Atezolizumab 1259m IV q 3 wks - s/p cycle #3     Interim History:  [name] Rose here today for follow-up.  Everything so far is doing well.  He really had no problems with the first cycle of Tecentriq.  He has had no cough or shortness of breath.  He actually played golf last week.  He did not shoot all that well.  However, he did enjoyed himself.  He has had neuropathy in his feet.  He is on gabapentin.  He takes 200 mg 3 times a day.  I will add some vitamin B6.  He has had no diarrhea.  He has had no bleeding.  He has had no nausea or vomiting.  Overall, his performance status is ECOG 1.   Medications:  Allergies as of 07/05/2017   No Known Allergies     Medication List        Accurate as of 07/05/17 10:37 AM. Always use your most recent med list.          ALEVE 220 MG Caps Generic drug:  Naproxen Sodium Take 220 mg by mouth 2 (two) times daily.   amLODipine 10 MG tablet Commonly known as:  NORVASC Take 10 mg by mouth every morning.   bisoprolol-hydrochlorothiazide 10-6.25 MG tablet Commonly known as:  ZIAC Take 1 tablet by mouth daily.   Cinnamon 500 MG Tabs Take 1 tablet by mouth 2 (two) times daily.   gabapentin 100 MG capsule Commonly known as:  NEURONTIN Take 2 capsules (200 mg total) by mouth 3 (three) times daily.   LORazepam 0.5 MG tablet Commonly known as:  ATIVAN TAKE 1 TABLET BY MOUTH EVERY 6 HOURS AS NEEDED FOR NAUSEA AND VOMITING   losartan 100 MG tablet Commonly known as:  COZAAR Take 100 mg by mouth daily.   MAGNESIUM PO Take 400 mg by mouth at bedtime. Leg cramps   ondansetron 8 MG tablet Commonly known as:  ZOFRAN TAKE 1 TABLET BY MOUTH TWICE A DAY START 3RD DAY AFTER CHEMO   prochlorperazine 10 MG tablet Commonly known as:   COMPAZINE TAKE 1 TABLET (10 MG TOTAL) BY MOUTH EVERY 6 (SIX) HOURS AS NEEDED (NAUSEA OR VOMITING).   vitamin B-6 250 MG tablet Take 1 tablet (250 mg total) by mouth daily.       Allergies: No Known Allergies  Past Medical History, Surgical history, Social history, and Family History were reviewed and updated.  Review of Systems: Review of Systems  Constitutional: Negative.   HENT: Negative.   Eyes: Negative.   Respiratory: Negative.   Cardiovascular: Negative.   Gastrointestinal: Negative.   Genitourinary: Negative.   Musculoskeletal: Negative.   Skin: Negative.   Neurological: Negative.   Endo/Heme/Allergies: Negative.   Psychiatric/Behavioral: Negative.      Physical Exam:  weight is 285 lb (129.3 kg). His oral temperature is 98.5 F (36.9 C). His blood pressure is 143/71 (abnormal) and his pulse is 76. His respiration is 18 and oxygen saturation is 100%.   Wt Readings from Last 3 Encounters:  07/05/17 285 lb (129.3 kg)  04/25/17 275 lb 5 oz (124.9 kg)  04/25/17 275 lb (124.7 kg)    Physical Exam  Constitutional: He is oriented to person, place, and time.  HENT:  Head: Normocephalic and atraumatic.  Mouth/Throat: Oropharynx is clear  and moist.  Eyes: Pupils are equal, round, and reactive to light. EOM are normal.  Neck: Normal range of motion.  Cardiovascular: Normal rate, regular rhythm and normal heart sounds.  Pulmonary/Chest: Effort normal and breath sounds normal.  Abdominal: Soft. Bowel sounds are normal.  Musculoskeletal: Normal range of motion. He exhibits no edema, tenderness or deformity.  Lymphadenopathy:    He has no cervical adenopathy.  Neurological: He is alert and oriented to person, place, and time.  Skin: Skin is warm and dry. No rash noted. No erythema.  Psychiatric: He has a normal mood and affect. His behavior is normal. Judgment and thought content normal.  Vitals reviewed.   Lab Results  Component Value Date   WBC 6.7 07/05/2017    HGB 13.6 05/14/2017   HCT 36.5 (L) 07/05/2017   MCV 88.6 07/05/2017   PLT 157 07/05/2017   No results found for: FERRITIN, IRON, TIBC, UIBC, IRONPCTSAT Lab Results  Component Value Date   RBC 4.12 (L) 07/05/2017   No results found for: KPAFRELGTCHN, LAMBDASER, KAPLAMBRATIO No results found for: IGGSERUM, IGA, IGMSERUM No results found for: [name], SPEI   Chemistry      Component Value Date/Time   NA 141 06/14/2017 1000   NA 141 02/21/2017 0902   K 4.0 06/14/2017 1000   K 4.1 02/21/2017 0902   CL 102 06/14/2017 1000   CL 98 02/21/2017 0902   CO2 29 06/14/2017 1000   CO2 31 02/21/2017 0902   BUN 24 (H) 06/14/2017 1000   BUN 17 02/21/2017 0902   CREATININE 1.60 (H) 06/14/2017 1000   CREATININE 1.2 02/21/2017 0902      Component Value Date/Time   CALCIUM 9.0 06/14/2017 1000   CALCIUM 9.2 02/21/2017 0902   ALKPHOS 59 06/14/2017 1000   ALKPHOS 65 02/21/2017 0902   AST 34 06/14/2017 1000   ALT 27 06/14/2017 1000   ALT 18 02/21/2017 0902   BILITOT 0.7 06/14/2017 1000      Impression and Plan: [name] Rose is a very pleasant 67 yo caucasian gentleman with metastatic high grade bladder cancer. He has done well with 6 cycles of Cisplatin/Gemcitabine.  He has last cycle back in October.  We will go ahead with his fourth cycle of Tecentriq.  We will get him back in 3 weeks for his 4th cycle.  After 4 cycles, we will plan for a follow-up PET scan.  Of note, we did send off molecular studies.  He does have a high TMB value.  As such, he should respond to immunotherapy.   Volanda Napoleon, MD 3/26/201910:37 AM

## 2017-07-05 NOTE — Addendum Note (Signed)
Addended by: Burney Gauze R on: 07/05/2017 10:59 AM   Modules accepted: Orders

## 2017-07-08 ENCOUNTER — Ambulatory Visit: Payer: PPO | Admitting: Neurology

## 2017-07-19 ENCOUNTER — Ambulatory Visit (HOSPITAL_COMMUNITY)
Admission: RE | Admit: 2017-07-19 | Discharge: 2017-07-19 | Disposition: A | Discharge status: Home / Self Care | Payer: PPO | Source: Ambulatory Visit | Attending: Hematology & Oncology | Admitting: Hematology & Oncology

## 2017-07-19 DIAGNOSIS — C679 Malignant neoplasm of bladder, unspecified: Secondary | ICD-10-CM | POA: Diagnosis not present

## 2017-07-19 DIAGNOSIS — I700 Atherosclerosis of aorta: Secondary | ICD-10-CM | POA: Insufficient documentation

## 2017-07-19 DIAGNOSIS — K760 Fatty (change of) liver, not elsewhere classified: Secondary | ICD-10-CM | POA: Diagnosis not present

## 2017-07-19 DIAGNOSIS — C772 Secondary and unspecified malignant neoplasm of intra-abdominal lymph nodes: Secondary | ICD-10-CM | POA: Diagnosis not present

## 2017-07-19 DIAGNOSIS — I288 Other diseases of pulmonary vessels: Secondary | ICD-10-CM | POA: Insufficient documentation

## 2017-07-19 DIAGNOSIS — N3289 Other specified disorders of bladder: Secondary | ICD-10-CM | POA: Diagnosis not present

## 2017-07-19 DIAGNOSIS — R918 Other nonspecific abnormal finding of lung field: Secondary | ICD-10-CM | POA: Diagnosis not present

## 2017-07-19 DIAGNOSIS — Z79899 Other long term (current) drug therapy: Secondary | ICD-10-CM | POA: Insufficient documentation

## 2017-07-19 DIAGNOSIS — I2510 Atherosclerotic heart disease of native coronary artery without angina pectoris: Secondary | ICD-10-CM | POA: Diagnosis not present

## 2017-07-19 LAB — GLUCOSE, CAPILLARY: Glucose-Capillary: 118 mg/dL — ABNORMAL HIGH (ref 65–99)

## 2017-07-19 MED ORDER — FLUDEOXYGLUCOSE F - 18 (FDG) INJECTION
14.2600 | Freq: Once | INTRAVENOUS | Status: AC | PRN
Start: 2017-07-19 — End: 2017-07-19
  Administered 2017-07-19: 14.26 via INTRAVENOUS

## 2017-07-26 ENCOUNTER — Inpatient Hospital Stay: Payer: PPO | Attending: Hematology & Oncology

## 2017-07-26 ENCOUNTER — Other Ambulatory Visit: Payer: Self-pay | Admitting: *Deleted

## 2017-07-26 ENCOUNTER — Inpatient Hospital Stay (HOSPITAL_BASED_OUTPATIENT_CLINIC_OR_DEPARTMENT_OTHER): Payer: PPO | Admitting: Hematology & Oncology

## 2017-07-26 ENCOUNTER — Inpatient Hospital Stay: Payer: PPO

## 2017-07-26 ENCOUNTER — Other Ambulatory Visit: Payer: Self-pay

## 2017-07-26 VITALS — Wt 285.0 lb

## 2017-07-26 DIAGNOSIS — C679 Malignant neoplasm of bladder, unspecified: Secondary | ICD-10-CM

## 2017-07-26 DIAGNOSIS — Z5111 Encounter for antineoplastic chemotherapy: Secondary | ICD-10-CM | POA: Insufficient documentation

## 2017-07-26 DIAGNOSIS — Z95828 Presence of other vascular implants and grafts: Secondary | ICD-10-CM

## 2017-07-26 DIAGNOSIS — Z7689 Persons encountering health services in other specified circumstances: Secondary | ICD-10-CM | POA: Diagnosis not present

## 2017-07-26 DIAGNOSIS — C772 Secondary and unspecified malignant neoplasm of intra-abdominal lymph nodes: Secondary | ICD-10-CM

## 2017-07-26 LAB — CBC WITH DIFFERENTIAL (CANCER CENTER ONLY)
Basophils Absolute: 0 10*3/uL (ref 0.0–0.1)
Basophils Relative: 0 %
Eosinophils Absolute: 0.2 10*3/uL (ref 0.0–0.5)
Eosinophils Relative: 4 %
HCT: 36.3 % — ABNORMAL LOW (ref 38.7–49.9)
Hemoglobin: 12.6 g/dL — ABNORMAL LOW (ref 13.0–17.1)
Lymphocytes Relative: 34 %
Lymphs Abs: 2.1 10*3/uL (ref 0.9–3.3)
MCH: 30.5 pg (ref 28.0–33.4)
MCHC: 34.7 g/dL (ref 32.0–35.9)
MCV: 87.9 fL (ref 82.0–98.0)
Monocytes Absolute: 0.5 10*3/uL (ref 0.1–0.9)
Monocytes Relative: 8 %
Neutro Abs: 3.4 10*3/uL (ref 1.5–6.5)
Neutrophils Relative %: 54 %
Platelet Count: 149 10*3/uL (ref 145–400)
RBC: 4.13 MIL/uL — ABNORMAL LOW (ref 4.20–5.70)
RDW: 13.4 % (ref 11.1–15.7)
WBC Count: 6.3 10*3/uL (ref 4.0–10.0)

## 2017-07-26 LAB — CMP (CANCER CENTER ONLY)
ALT: 29 U/L (ref 10–47)
AST: 37 U/L (ref 11–38)
Albumin: 3.7 g/dL (ref 3.5–5.0)
Alkaline Phosphatase: 59 U/L (ref 26–84)
Anion gap: 11 (ref 5–15)
BUN: 25 mg/dL — ABNORMAL HIGH (ref 7–22)
CO2: 30 mmol/L (ref 18–33)
Calcium: 9 mg/dL (ref 8.0–10.3)
Chloride: 101 mmol/L (ref 98–108)
Creatinine: 1.5 mg/dL — ABNORMAL HIGH (ref 0.60–1.20)
Glucose, Bld: 231 mg/dL — ABNORMAL HIGH (ref 73–118)
Potassium: 4.1 mmol/L (ref 3.3–4.7)
Sodium: 142 mmol/L (ref 128–145)
Total Bilirubin: 0.8 mg/dL (ref 0.2–1.6)
Total Protein: 6.6 g/dL (ref 6.4–8.1)

## 2017-07-26 LAB — LACTATE DEHYDROGENASE: LDH: 211 U/L (ref 125–245)

## 2017-07-26 MED ORDER — AMOXICILLIN 500 MG PO TABS: 2000.0000 mg | ORAL_TABLET | Freq: Once | ORAL | 3 refills | Status: AC

## 2017-07-26 MED ORDER — HEPARIN SOD (PORK) LOCK FLUSH 100 UNIT/ML IV SOLN
500.0000 [IU] | Freq: Once | INTRAVENOUS | Status: AC
  Administered 2017-07-26: 500 [IU] via INTRAVENOUS
  Filled 2017-07-26: qty 5

## 2017-07-26 MED ORDER — SODIUM CHLORIDE 0.9% FLUSH
10.0000 mL | INTRAVENOUS | Status: DC | PRN
  Administered 2017-07-26: 10 mL via INTRAVENOUS
  Filled 2017-07-26: qty 10

## 2017-07-26 NOTE — Progress Notes (Signed)
DISCONTINUE ON PATHWAY REGIMEN - Bladder     A cycle is 21 days:     Pembrolizumab   **Always confirm dose/schedule in your pharmacy ordering system**    REASON: Disease Progression PRIOR TREATMENT: [id number]: Pembrolizumab 200 mg q21 Days Until Progression,  Unacceptable Toxicity, or up to 24 Months TREATMENT RESPONSE: Partial Response (PR)  START OFF PATHWAY REGIMEN - Bladder   OFF00006:Docetaxel (100 mg/m2):   A cycle is every 21 days:     Docetaxel   **Always confirm dose/schedule in your pharmacy ordering system**    Patient Characteristics: Metastatic Disease, Third Line and Beyond AJCC M Category: M1a AJCC N Category: N3 AJCC T Category: T3 Current evidence of distant metastases<= Yes AJCC 8 Stage Grouping: IVA Line of Therapy: Third Line and Beyond  Intent of Therapy: Non-Curative / Palliative Intent, Discussed with Patient

## 2017-07-26 NOTE — Progress Notes (Signed)
Hematology and Oncology Follow Up Visit  [name] Rose [id number] [birth year] 67 y.o. 07/26/2017   Principle Diagnosis:  Metastatic high-grade bladder cancer -- recurrent  Current Therapy:  Atezolizumab 1251m IV q 3 wks - s/p cycle #4 - d/c due to progression.  Taxotere 890mm2 IV q 3 wks - start 08/02/2017     Interim History:  [name] Rose here today for follow-up.  Unfortunately, he has progressive disease.  After the Tecentriq, we did go ahead and do a PET scan.  The PET scan did show new areas of adenopathy.  It is amazing that his disease only has affected his lymph nodes.  So far, his organs have been spared.  We will have to make a change with his therapy.  We will go ahead and put him back on the chemotherapy with Taxotere.  I think the single agent this should have fairly good activity.  We are definite looking at quality of life issues.  He enjoys playing golf.  He is not bothered too much by neuropathy right now.  After his appointment today, [place].  He has had no problems with pain.  He has had no change in bowel or bladder habits.  He is had no leg swelling.  He has had no cough or shortness of breath.  He has had no bleeding.  Overall, his performance status is ECOG 1.    Medications:  Allergies as of 07/26/2017   No Known Allergies     Medication List        Accurate as of 07/26/17 10:42 AM. Always use your most recent med list.          ALEVE 220 MG Caps Generic drug:  Naproxen Sodium Take 220 mg by mouth 2 (two) times daily.   amLODipine 10 MG tablet Commonly known as:  NORVASC Take 10 mg by mouth every morning.   amoxicillin 500 MG tablet Commonly known as:  AMOXIL Take 4 tablets (2,000 mg total) by mouth once for 1 dose. Take 1 hour prior to dental procedure.   bisoprolol-hydrochlorothiazide 10-6.25 MG tablet Commonly known as:  ZIAC Take 1 tablet by mouth daily.   Cinnamon  500 MG Tabs Take 1 tablet by mouth 2 (two) times daily.   gabapentin 100 MG capsule Commonly known as:  NEURONTIN Take 2 capsules (200 mg total) by mouth 3 (three) times daily.   losartan 100 MG tablet Commonly known as:  COZAAR Take 100 mg by mouth daily.   MAGNESIUM PO Take 400 mg by mouth at bedtime. Leg cramps   vitamin B-6 250 MG tablet Take 1 tablet (250 mg total) by mouth daily.       Allergies: No Known Allergies  Past Medical History, Surgical history, Social history, and Family History were reviewed and updated.  Review of Systems: Review of Systems  Constitutional: Negative.   HENT: Negative.   Eyes: Negative.   Respiratory: Negative.   Cardiovascular: Negative.   Gastrointestinal: Negative.   Genitourinary: Negative.   Musculoskeletal: Negative.   Skin: Negative.   Neurological: Negative.   Endo/Heme/Allergies: Negative.   Psychiatric/Behavioral: Negative.      Physical Exam:  weight is 285 lb (129.3 kg).   Wt Readings from Last 3 Encounters:  07/26/17 285 lb (129.3 kg)  07/05/17 285 lb (129.3 kg)  04/25/17 275 lb 5 oz (124.9 kg)    Physical Exam  Constitutional: He is oriented to  person, place, and time.  HENT:  Head: Normocephalic and atraumatic.  Mouth/Throat: Oropharynx is clear and moist.  Eyes: Pupils are equal, round, and reactive to light. EOM are normal.  Neck: Normal range of motion.  Cardiovascular: Normal rate, regular rhythm and normal heart sounds.  Pulmonary/Chest: Effort normal and breath sounds normal.  Abdominal: Soft. Bowel sounds are normal.  Musculoskeletal: Normal range of motion. He exhibits no edema, tenderness or deformity.  Lymphadenopathy:    He has no cervical adenopathy.  Neurological: He is alert and oriented to person, place, and time.  Skin: Skin is warm and dry. No rash noted. No erythema.  Psychiatric: He has a normal mood and affect. His behavior is normal. Judgment and thought content normal.  Vitals  reviewed.   Lab Results  Component Value Date   WBC 6.3 07/26/2017   HGB 13.6 05/14/2017   HCT 36.3 (L) 07/26/2017   MCV 87.9 07/26/2017   PLT 149 07/26/2017   No results found for: FERRITIN, IRON, TIBC, UIBC, IRONPCTSAT Lab Results  Component Value Date   RBC 4.13 (L) 07/26/2017   No results found for: KPAFRELGTCHN, LAMBDASER, KAPLAMBRATIO No results found for: IGGSERUM, IGA, IGMSERUM No results found for: [name], SPEI   Chemistry      Component Value Date/Time   NA 142 07/26/2017 0935   NA 141 02/21/2017 0902   K 4.1 07/26/2017 0935   K 4.1 02/21/2017 0902   CL 101 07/26/2017 0935   CL 98 02/21/2017 0902   CO2 30 07/26/2017 0935   CO2 31 02/21/2017 0902   BUN 25 (H) 07/26/2017 0935   BUN 17 02/21/2017 0902   CREATININE 1.50 (H) 07/26/2017 0935   CREATININE 1.2 02/21/2017 0902      Component Value Date/Time   CALCIUM 9.0 07/26/2017 0935   CALCIUM 9.2 02/21/2017 0902   ALKPHOS 59 07/26/2017 0935   ALKPHOS 65 02/21/2017 0902   AST 37 07/26/2017 0935   ALT 29 07/26/2017 0935   ALT 18 02/21/2017 0902   BILITOT 0.8 07/26/2017 0935      Impression and Plan: [name] Rose is a very pleasant 67 yo caucasian gentleman with metastatic high grade bladder cancer. He has done well with 6 cycles of Cisplatin/Gemcitabine.  He has last cycle back in October.  We then went ahead and treated him with immunotherapy with Tecentriq.  I thought that he would respond.  However, he just did not have much of a response.  This does surprise me since he had a high TMB value.  I believe that Taxotere will help get him back into remission.  I went over the side effects of Taxotere.  I told him that it will cause his blood counts to go down.  He will need Neulasta to help with his immune system.  He will probably lose his hair.  He understands all this.  I would give him 3 cycles and then repeat his PET scan.  I still have to  believe that he has good sensitivity to therapy with his bladder cancer.  I will plan to see him back in 4 weeks which will be the day of his second cycle of treatment.  I spent about 40 minutes with [name] Rose today.  Over half the time was spent face-to-face with him going over his PET scan results and labs and talking about changing treatments.   Volanda Napoleon, MD 4/16/201910:42 AM

## 2017-08-02 ENCOUNTER — Inpatient Hospital Stay: Payer: PPO

## 2017-08-02 VITALS — BP 127/67 | HR 71 | Temp 98.1°F | Resp 17

## 2017-08-02 DIAGNOSIS — C772 Secondary and unspecified malignant neoplasm of intra-abdominal lymph nodes: Secondary | ICD-10-CM

## 2017-08-02 DIAGNOSIS — C679 Malignant neoplasm of bladder, unspecified: Secondary | ICD-10-CM

## 2017-08-02 DIAGNOSIS — Z5111 Encounter for antineoplastic chemotherapy: Secondary | ICD-10-CM | POA: Diagnosis not present

## 2017-08-02 LAB — CBC WITH DIFFERENTIAL (CANCER CENTER ONLY)
Basophils Absolute: 0 10*3/uL (ref 0.0–0.1)
Basophils Relative: 0 %
Eosinophils Absolute: 0.3 10*3/uL (ref 0.0–0.5)
Eosinophils Relative: 4 %
HCT: 36.3 % — ABNORMAL LOW (ref 38.7–49.9)
Hemoglobin: 12.4 g/dL — ABNORMAL LOW (ref 13.0–17.1)
Lymphocytes Relative: 30 %
Lymphs Abs: 1.9 10*3/uL (ref 0.9–3.3)
MCH: 30.1 pg (ref 28.0–33.4)
MCHC: 34.2 g/dL (ref 32.0–35.9)
MCV: 88.1 fL (ref 82.0–98.0)
Monocytes Absolute: 0.6 10*3/uL (ref 0.1–0.9)
Monocytes Relative: 9 %
Neutro Abs: 3.7 10*3/uL (ref 1.5–6.5)
Neutrophils Relative %: 57 %
Platelet Count: 158 10*3/uL (ref 145–400)
RBC: 4.12 MIL/uL — ABNORMAL LOW (ref 4.20–5.70)
RDW: 13 % (ref 11.1–15.7)
WBC Count: 6.5 10*3/uL (ref 4.0–10.0)

## 2017-08-02 LAB — CMP (CANCER CENTER ONLY)
ALT: 31 U/L (ref 10–47)
AST: 34 U/L (ref 11–38)
Albumin: 3.7 g/dL (ref 3.5–5.0)
Alkaline Phosphatase: 62 U/L (ref 26–84)
Anion gap: 7 (ref 5–15)
BUN: 28 mg/dL — ABNORMAL HIGH (ref 7–22)
CO2: 31 mmol/L (ref 18–33)
Calcium: 9.4 mg/dL (ref 8.0–10.3)
Chloride: 103 mmol/L (ref 98–108)
Creatinine: 1.5 mg/dL — ABNORMAL HIGH (ref 0.60–1.20)
Glucose, Bld: 189 mg/dL — ABNORMAL HIGH (ref 73–118)
Potassium: 4 mmol/L (ref 3.3–4.7)
Sodium: 141 mmol/L (ref 128–145)
Total Bilirubin: 0.8 mg/dL (ref 0.2–1.6)
Total Protein: 6.5 g/dL (ref 6.4–8.1)

## 2017-08-02 LAB — LACTATE DEHYDROGENASE: LDH: 220 U/L (ref 125–245)

## 2017-08-02 MED ORDER — SODIUM CHLORIDE 0.9% FLUSH
10.0000 mL | INTRAVENOUS | Status: DC | PRN
  Administered 2017-08-02: 10 mL
  Filled 2017-08-02: qty 10

## 2017-08-02 MED ORDER — ONDANSETRON HCL 4 MG/2ML IJ SOLN
INTRAMUSCULAR | Status: AC
  Filled 2017-08-02: qty 4

## 2017-08-02 MED ORDER — SODIUM CHLORIDE 0.9 % IV SOLN
80.0000 mg/m2 | Freq: Once | INTRAVENOUS | Status: AC
  Administered 2017-08-02: 210 mg via INTRAVENOUS
  Filled 2017-08-02: qty 21

## 2017-08-02 MED ORDER — DEXAMETHASONE SODIUM PHOSPHATE 10 MG/ML IJ SOLN
INTRAMUSCULAR | Status: AC
  Filled 2017-08-02: qty 1

## 2017-08-02 MED ORDER — LORAZEPAM 0.5 MG PO TABS: 0.5000 mg | ORAL_TABLET | Freq: Four times a day (QID) | ORAL | 0 refills | Status: DC | PRN

## 2017-08-02 MED ORDER — FAMOTIDINE IN NACL 20-0.9 MG/50ML-% IV SOLN
40.0000 mg | Freq: Once | INTRAVENOUS | Status: AC
  Administered 2017-08-02: 40 mg via INTRAVENOUS

## 2017-08-02 MED ORDER — FAMOTIDINE IN NACL 20-0.9 MG/50ML-% IV SOLN
INTRAVENOUS | Status: AC
  Filled 2017-08-02: qty 100

## 2017-08-02 MED ORDER — AMOXICILLIN 500 MG PO TABS: ORAL_TABLET | ORAL | 4 refills | Status: DC

## 2017-08-02 MED ORDER — DEXAMETHASONE SODIUM PHOSPHATE 10 MG/ML IJ SOLN
10.0000 mg | Freq: Once | INTRAMUSCULAR | Status: AC
  Administered 2017-08-02: 10 mg via INTRAVENOUS

## 2017-08-02 MED ORDER — PROCHLORPERAZINE MALEATE 10 MG PO TABS: 10.0000 mg | ORAL_TABLET | Freq: Four times a day (QID) | ORAL | 1 refills | Status: DC | PRN

## 2017-08-02 MED ORDER — DEXAMETHASONE 4 MG PO TABS: 8.0000 mg | ORAL_TABLET | Freq: Two times a day (BID) | ORAL | 1 refills | Status: DC

## 2017-08-02 MED ORDER — SODIUM CHLORIDE 0.9 % IV SOLN
Freq: Once | INTRAVENOUS | Status: AC
  Administered 2017-08-02: 09:00:00 via INTRAVENOUS

## 2017-08-02 MED ORDER — ONDANSETRON HCL 8 MG PO TABS: 8.0000 mg | ORAL_TABLET | Freq: Two times a day (BID) | ORAL | 1 refills | Status: DC | PRN

## 2017-08-02 MED ORDER — ONDANSETRON HCL 4 MG/2ML IJ SOLN
8.0000 mg | Freq: Once | INTRAMUSCULAR | Status: AC
  Administered 2017-08-02: 8 mg via INTRAVENOUS

## 2017-08-02 MED ORDER — HEPARIN SOD (PORK) LOCK FLUSH 100 UNIT/ML IV SOLN
500.0000 [IU] | Freq: Once | INTRAVENOUS | Status: AC | PRN
  Administered 2017-08-02: 500 [IU]
  Filled 2017-08-02: qty 5

## 2017-08-02 NOTE — Patient Instructions (Signed)
Implanted Port Home Guide An implanted port is a type of central line that is placed under the skin. Central lines are used to provide IV access when treatment or nutrition needs to be given through a person's veins. Implanted ports are used for long-term IV access. An implanted port may be placed because:  You need IV medicine that would be irritating to the small veins in your hands or arms.  You need long-term IV medicines, such as antibiotics.  You need IV nutrition for a long period.  You need frequent blood draws for lab tests.  You need dialysis.  Implanted ports are usually placed in the chest area, but they can also be placed in the upper arm, the abdomen, or the leg. An implanted port has two main parts:  Reservoir. The reservoir is round and will appear as a small, raised area under your skin. The reservoir is the part where a needle is inserted to give medicines or draw blood.  Catheter. The catheter is a thin, flexible tube that extends from the reservoir. The catheter is placed into a large vein. Medicine that is inserted into the reservoir goes into the catheter and then into the vein.  How will I care for my incision site? Do not get the incision site wet. Bathe or shower as directed by your health care provider. How is my port accessed? Special steps must be taken to access the port:  Before the port is accessed, a numbing cream can be placed on the skin. This helps numb the skin over the port site.  Your health care provider uses a sterile technique to access the port. ? Your health care provider must put on a mask and sterile gloves. ? The skin over your port is cleaned carefully with an antiseptic and allowed to dry. ? The port is gently pinched between sterile gloves, and a needle is inserted into the port.  Only "non-coring" port needles should be used to access the port. Once the port is accessed, a blood return should be checked. This helps ensure that the port  is in the vein and is not clogged.  If your port needs to remain accessed for a constant infusion, a clear (transparent) bandage will be placed over the needle site. The bandage and needle will need to be changed every week, or as directed by your health care provider.  Keep the bandage covering the needle clean and dry. Do not get it wet. Follow your health care provider's instructions on how to take a shower or bath while the port is accessed.  If your port does not need to stay accessed, no bandage is needed over the port.  What is flushing? Flushing helps keep the port from getting clogged. Follow your health care provider's instructions on how and when to flush the port. Ports are usually flushed with saline solution or a medicine called heparin. The need for flushing will depend on how the port is used.  If the port is used for intermittent medicines or blood draws, the port will need to be flushed: ? After medicines have been given. ? After blood has been drawn. ? As part of routine maintenance.  If a constant infusion is running, the port may not need to be flushed.  How long will my port stay implanted? The port can stay in for as long as your health care provider thinks it is needed. When it is time for the port to come out, surgery will be   done to remove it. The procedure is similar to the one performed when the port was put in. When should I seek immediate medical care? When you have an implanted port, you should seek immediate medical care if:  You notice a bad smell coming from the incision site.  You have swelling, redness, or drainage at the incision site.  You have more swelling or pain at the port site or the surrounding area.  You have a fever that is not controlled with medicine.  This information is not intended to replace advice given to you by your health care provider. Make sure you discuss any questions you have with your health care provider. Document  Released: 03/29/2005 Document Revised: 09/04/2015 Document Reviewed: 12/04/2012 Elsevier Interactive Patient Education  2017 [employer]  

## 2017-08-02 NOTE — Addendum Note (Signed)
Addended by: Burney Gauze R on: 08/02/2017 11:32 AM   Modules accepted: Orders

## 2017-08-02 NOTE — Patient Instructions (Addendum)
[facility] Discharge Instructions for Patients Receiving Chemotherapy  Today you received the following chemotherapy agents Taxotere.  To help prevent nausea and vomiting after your treatment, we encourage you to take your nausea medication as directed. YOU HAVE ZOFRAN, COMPAZINE AND ATIVAN TO PICK UP AT THE PHARMACY. ATIVAN IS THE LAST RESORT TO USE FOR PERSISTENT NAUSEA/CRAMPING. YOU CAN TAKE EITHER THE ZOFRAN OR THE COMPAZINE JUST NOT AT THE SAME TIME; COMPAZINE TENDS TO MAKE YOU SLEEPIER.   If you develop nausea and vomiting that is not controlled by your nausea medication, call the clinic.   BELOW ARE SYMPTOMS THAT SHOULD BE REPORTED IMMEDIATELY:  *FEVER GREATER THAN 100.5 F  *CHILLS WITH OR WITHOUT FEVER  NAUSEA AND VOMITING THAT IS NOT CONTROLLED WITH YOUR NAUSEA MEDICATION  *UNUSUAL SHORTNESS OF BREATH  *UNUSUAL BRUISING OR BLEEDING  TENDERNESS IN MOUTH AND THROAT WITH OR WITHOUT PRESENCE OF ULCERS  *URINARY PROBLEMS  *BOWEL PROBLEMS  UNUSUAL RASH Items with * indicate a potential emergency and should be followed up as soon as possible.  Feel free to call the clinic you have any questions or concerns. The clinic phone number is [phone].  Please show the [place] at check-in to the Emergency Department and triage nurse.     Docetaxel injection What is this medicine? DOCETAXEL (doe se TAX el) is a chemotherapy drug. It targets fast dividing cells, like cancer cells, and causes these cells to die. This medicine is used to treat many types of cancers like breast cancer, certain stomach cancers, head and neck cancer, lung cancer, and prostate cancer. This medicine may be used for other purposes; ask your health care provider or pharmacist if you have questions. COMMON BRAND NAME(S): Docefrez, Taxotere What should I tell my health care provider before I take this medicine? They need to know if you have any of these conditions: -infection  (especially a virus infection such as chickenpox, cold sores, or herpes) -liver disease -low blood counts, like low white cell, platelet, or red cell counts -an unusual or allergic reaction to docetaxel, polysorbate 80, other chemotherapy agents, other medicines, foods, dyes, or preservatives -pregnant or trying to get pregnant -breast-feeding How should I use this medicine? This drug is given as an infusion into a vein. It is administered in a hospital or clinic by a specially trained health care professional. Talk to your pediatrician regarding the use of this medicine in children. Special care may be needed. Overdosage: If you think you have taken too much of this medicine contact a poison control center or emergency room at once. NOTE: This medicine is only for you. Do not share this medicine with others. What if I miss a dose? It is important not to miss your dose. Call your doctor or health care professional if you are unable to keep an appointment. What may interact with this medicine? -cyclosporine -erythromycin -ketoconazole -medicines to increase blood counts like filgrastim, pegfilgrastim, sargramostim -vaccines Talk to your doctor or health care professional before taking any of these medicines: -acetaminophen -aspirin -ibuprofen -ketoprofen -naproxen This list may not describe all possible interactions. Give your health care provider a list of all the medicines, herbs, non-prescription drugs, or dietary supplements you use. Also tell them if you smoke, drink alcohol, or use illegal drugs. Some items may interact with your medicine. What should I watch for while using this medicine? Your condition will be monitored carefully while you are receiving this medicine. You will need important blood work  done while you are taking this medicine. This drug may make you feel generally unwell. This is not uncommon, as chemotherapy can affect healthy cells as well as cancer cells. Report  any side effects. Continue your course of treatment even though you feel ill unless your doctor tells you to stop. In some cases, you may be given additional medicines to help with side effects. Follow all directions for their use. Call your doctor or health care professional for advice if you get a fever, chills or sore throat, or other symptoms of a cold or flu. Do not treat yourself. This drug decreases your body's ability to fight infections. Try to avoid being around people who are sick. This medicine may increase your risk to bruise or bleed. Call your doctor or health care professional if you notice any unusual bleeding. This medicine may contain alcohol in the product. You may get drowsy or dizzy. Do not drive, use machinery, or do anything that needs mental alertness until you know how this medicine affects you. Do not stand or sit up quickly, especially if you are an older patient. This reduces the risk of dizzy or fainting spells. Avoid alcoholic drinks. Do not become pregnant while taking this medicine. Women should inform their doctor if they wish to become pregnant or think they might be pregnant. There is a potential for serious side effects to an unborn child. Talk to your health care professional or pharmacist for more information. Do not breast-feed an infant while taking this medicine. What side effects may I notice from receiving this medicine? Side effects that you should report to your doctor or health care professional as soon as possible: -allergic reactions like skin rash, itching or hives, swelling of the face, lips, or tongue -low blood counts - This drug may decrease the number of white blood cells, red blood cells and platelets. You may be at increased risk for infections and bleeding. -signs of infection - fever or chills, cough, sore throat, pain or difficulty passing urine -signs of decreased platelets or bleeding - bruising, pinpoint red spots on the skin, black, tarry  stools, nosebleeds -signs of decreased red blood cells - unusually weak or tired, fainting spells, lightheadedness -breathing problems -fast or irregular heartbeat -low blood pressure -mouth sores -nausea and vomiting -pain, swelling, redness or irritation at the injection site -pain, tingling, numbness in the hands or feet -swelling of the ankle, feet, hands -weight gain Side effects that usually do not require medical attention (report to your doctor or health care professional if they continue or are bothersome): -bone pain -complete hair loss including hair on your head, underarms, pubic hair, eyebrows, and eyelashes -diarrhea -excessive tearing -changes in the color of fingernails -loosening of the fingernails -nausea -muscle pain -red flush to skin -sweating -weak or tired This list may not describe all possible side effects. Call your doctor for medical advice about side effects. You may report side effects to FDA at 1-800-FDA-1088. Where should I keep my medicine? This drug is given in a hospital or clinic and will not be stored at home. NOTE: This sheet is a summary. It may not cover all possible information. If you have questions about this medicine, talk to your doctor, pharmacist, or health care provider.  2018 Elsevier/Gold Standard (2015-05-01 12:32:56)

## 2017-08-04 ENCOUNTER — Telehealth: Payer: Self-pay

## 2017-08-04 ENCOUNTER — Inpatient Hospital Stay: Payer: PPO

## 2017-08-04 VITALS — BP 127/98 | HR 77 | Temp 98.2°F | Resp 16

## 2017-08-04 DIAGNOSIS — Z5111 Encounter for antineoplastic chemotherapy: Secondary | ICD-10-CM | POA: Diagnosis not present

## 2017-08-04 DIAGNOSIS — C772 Secondary and unspecified malignant neoplasm of intra-abdominal lymph nodes: Secondary | ICD-10-CM

## 2017-08-04 DIAGNOSIS — C679 Malignant neoplasm of bladder, unspecified: Secondary | ICD-10-CM

## 2017-08-04 MED ORDER — PEGFILGRASTIM-CBQV 6 MG/0.6ML ~~LOC~~ SOSY
PREFILLED_SYRINGE | SUBCUTANEOUS | Status: AC
  Filled 2017-08-04: qty 0.6

## 2017-08-04 MED ORDER — PEGFILGRASTIM-CBQV 6 MG/0.6ML ~~LOC~~ SOSY
6.0000 mg | PREFILLED_SYRINGE | Freq: Once | SUBCUTANEOUS | Status: AC
  Administered 2017-08-04: 6 mg via SUBCUTANEOUS

## 2017-08-04 NOTE — Telephone Encounter (Signed)
Spoke with pt in office re: chemo follow up call for 1st time Taxotere.  Pt denies nausea, vomiting, bowel changes, fatigue. Reports he does notice his "tastebuds changing," but denies decreased appetite. Able to tolerate po fluids and food at this time. Reviewed with pt how to take anti-emetics. Also educated pt on Ucendya & taking Claritin for potential relief of bone pain. Aware of how to reach office and/or on call MD for questions or concerns. dph

## 2017-08-04 NOTE — Patient Instructions (Signed)
Pegfilgrastim injection What is this medicine? PEGFILGRASTIM (PEG fil gra stim) is a long-acting granulocyte colony-stimulating factor that stimulates the growth of neutrophils, a type of white blood cell important in the body's fight against infection. It is used to reduce the incidence of fever and infection in patients with certain types of cancer who are receiving chemotherapy that affects the bone marrow, and to increase survival after being exposed to high doses of radiation. This medicine may be used for other purposes; ask your health care provider or pharmacist if you have questions. COMMON BRAND NAME(S): Neulasta What should I tell my health care provider before I take this medicine? They need to know if you have any of these conditions: -kidney disease -latex allergy -ongoing radiation therapy -sickle cell disease -skin reactions to acrylic adhesives (On-Body Injector only) -an unusual or allergic reaction to pegfilgrastim, filgrastim, other medicines, foods, dyes, or preservatives -pregnant or trying to get pregnant -breast-feeding How should I use this medicine? This medicine is for injection under the skin. If you get this medicine at home, you will be taught how to prepare and give the pre-filled syringe or how to use the On-body Injector. Refer to the patient Instructions for Use for detailed instructions. Use exactly as directed. Tell your healthcare provider immediately if you suspect that the On-body Injector may not have performed as intended or if you suspect the use of the On-body Injector resulted in a missed or partial dose. It is important that you put your used needles and syringes in a special sharps container. Do not put them in a trash can. If you do not have a sharps container, call your pharmacist or healthcare provider to get one. Talk to your pediatrician regarding the use of this medicine in children. While this drug may be prescribed for selected conditions,  precautions do apply. Overdosage: If you think you have taken too much of this medicine contact a poison control center or emergency room at once. NOTE: This medicine is only for you. Do not share this medicine with others. What if I miss a dose? It is important not to miss your dose. Call your doctor or health care professional if you miss your dose. If you miss a dose due to an On-body Injector failure or leakage, a new dose should be administered as soon as possible using a single prefilled syringe for manual use. What may interact with this medicine? Interactions have not been studied. Give your health care provider a list of all the medicines, herbs, non-prescription drugs, or dietary supplements you use. Also tell them if you smoke, drink alcohol, or use illegal drugs. Some items may interact with your medicine. This list may not describe all possible interactions. Give your health care provider a list of all the medicines, herbs, non-prescription drugs, or dietary supplements you use. Also tell them if you smoke, drink alcohol, or use illegal drugs. Some items may interact with your medicine. What should I watch for while using this medicine? You may need blood work done while you are taking this medicine. If you are going to need a MRI, CT scan, or other procedure, tell your doctor that you are using this medicine (On-Body Injector only). What side effects may I notice from receiving this medicine? Side effects that you should report to your doctor or health care professional as soon as possible: -allergic reactions like skin rash, itching or hives, swelling of the face, lips, or tongue -dizziness -fever -pain, redness, or irritation at site   where injected -pinpoint red spots on the skin -red or dark-brown urine -shortness of breath or breathing problems -stomach or side pain, or pain at the shoulder -swelling -tiredness -trouble passing urine or change in the amount of urine Side  effects that usually do not require medical attention (report to your doctor or health care professional if they continue or are bothersome): -bone pain -muscle pain This list may not describe all possible side effects. Call your doctor for medical advice about side effects. You may report side effects to FDA at 1-800-FDA-1088. Where should I keep my medicine? Keep out of the reach of children. Store pre-filled syringes in a refrigerator between 2 and 8 degrees C (36 and 46 degrees F). Do not freeze. Keep in carton to protect from light. Throw away this medicine if it is left out of the refrigerator for more than 48 hours. Throw away any unused medicine after the expiration date. NOTE: This sheet is a summary. It may not cover all possible information. If you have questions about this medicine, talk to your doctor, pharmacist, or health care provider.  2018 Elsevier/Gold Standard (2016-03-25 12:58:03)  

## 2017-08-10 ENCOUNTER — Telehealth: Payer: Self-pay | Admitting: *Deleted

## 2017-08-10 MED ORDER — MAGIC MOUTHWASH W/LIDOCAINE: 5.0000 mL | Freq: Four times a day (QID) | ORAL | 0 refills | Status: DC

## 2017-08-10 NOTE — Telephone Encounter (Signed)
Patient's wife is notifying the office that patient has developed mouth sores under his lip. They are painful and bleeding. The patient is currently vacationing in [facility] and needs something called in.  Reviewed symptoms with Dr Marin Olp. He would like patient started on Magic Mouthwash.   Patient's wife aware of new prescription. Pharmacy confirmed.

## 2017-08-16 ENCOUNTER — Other Ambulatory Visit: Payer: Self-pay | Admitting: Hematology & Oncology

## 2017-08-16 ENCOUNTER — Other Ambulatory Visit: Payer: PPO

## 2017-08-16 ENCOUNTER — Ambulatory Visit: Payer: PPO

## 2017-08-16 ENCOUNTER — Ambulatory Visit: Payer: PPO | Admitting: Hematology & Oncology

## 2017-08-22 ENCOUNTER — Other Ambulatory Visit: Payer: Self-pay

## 2017-08-22 DIAGNOSIS — C679 Malignant neoplasm of bladder, unspecified: Secondary | ICD-10-CM

## 2017-08-22 DIAGNOSIS — C772 Secondary and unspecified malignant neoplasm of intra-abdominal lymph nodes: Secondary | ICD-10-CM

## 2017-08-23 ENCOUNTER — Inpatient Hospital Stay: Payer: PPO

## 2017-08-23 ENCOUNTER — Other Ambulatory Visit: Payer: Self-pay

## 2017-08-23 ENCOUNTER — Inpatient Hospital Stay: Payer: PPO | Attending: Hematology & Oncology

## 2017-08-23 ENCOUNTER — Inpatient Hospital Stay (HOSPITAL_BASED_OUTPATIENT_CLINIC_OR_DEPARTMENT_OTHER): Payer: PPO | Admitting: Hematology & Oncology

## 2017-08-23 VITALS — BP 126/48 | HR 83 | Temp 98.5°F | Resp 22 | Wt 276.5 lb

## 2017-08-23 DIAGNOSIS — C772 Secondary and unspecified malignant neoplasm of intra-abdominal lymph nodes: Secondary | ICD-10-CM

## 2017-08-23 DIAGNOSIS — C679 Malignant neoplasm of bladder, unspecified: Secondary | ICD-10-CM

## 2017-08-23 DIAGNOSIS — D509 Iron deficiency anemia, unspecified: Secondary | ICD-10-CM | POA: Diagnosis not present

## 2017-08-23 DIAGNOSIS — D500 Iron deficiency anemia secondary to blood loss (chronic): Secondary | ICD-10-CM

## 2017-08-23 DIAGNOSIS — R5383 Other fatigue: Secondary | ICD-10-CM | POA: Insufficient documentation

## 2017-08-23 DIAGNOSIS — Z7689 Persons encountering health services in other specified circumstances: Secondary | ICD-10-CM | POA: Insufficient documentation

## 2017-08-23 DIAGNOSIS — Z5111 Encounter for antineoplastic chemotherapy: Secondary | ICD-10-CM | POA: Diagnosis not present

## 2017-08-23 LAB — CBC WITH DIFFERENTIAL (CANCER CENTER ONLY)
Basophils Absolute: 0 10*3/uL (ref 0.0–0.1)
Basophils Relative: 1 %
Eosinophils Absolute: 0.5 10*3/uL (ref 0.0–0.5)
Eosinophils Relative: 6 %
HCT: 30.6 % — ABNORMAL LOW (ref 38.7–49.9)
Hemoglobin: 10.2 g/dL — ABNORMAL LOW (ref 13.0–17.1)
Lymphocytes Relative: 16 %
Lymphs Abs: 1.2 10*3/uL (ref 0.9–3.3)
MCH: 29.8 pg (ref 28.0–33.4)
MCHC: 33.3 g/dL (ref 32.0–35.9)
MCV: 89.5 fL (ref 82.0–98.0)
Monocytes Absolute: 0.9 10*3/uL (ref 0.1–0.9)
Monocytes Relative: 12 %
Neutro Abs: 4.8 10*3/uL (ref 1.5–6.5)
Neutrophils Relative %: 65 %
Platelet Count: 170 10*3/uL (ref 145–400)
RBC: 3.42 MIL/uL — ABNORMAL LOW (ref 4.20–5.70)
RDW: 13.5 % (ref 11.1–15.7)
WBC Count: 7.5 10*3/uL (ref 4.0–10.0)

## 2017-08-23 LAB — COMPREHENSIVE METABOLIC PANEL
ALT: 29 U/L (ref 10–47)
AST: 37 U/L (ref 11–38)
Albumin: 3.1 g/dL — ABNORMAL LOW (ref 3.5–5.0)
Alkaline Phosphatase: 59 U/L (ref 26–84)
Anion gap: 6 (ref 5–15)
BUN: 19 mg/dL (ref 7–22)
CO2: 30 mmol/L (ref 18–33)
Calcium: 9.1 mg/dL (ref 8.0–10.3)
Chloride: 100 mmol/L (ref 98–108)
Creatinine, Ser: 1.5 mg/dL — ABNORMAL HIGH (ref 0.60–1.20)
Glucose, Bld: 163 mg/dL — ABNORMAL HIGH (ref 73–118)
Potassium: 3.8 mmol/L (ref 3.3–4.7)
Sodium: 136 mmol/L (ref 128–145)
Total Bilirubin: 0.9 mg/dL (ref 0.2–1.6)
Total Protein: 6 g/dL — ABNORMAL LOW (ref 6.4–8.1)

## 2017-08-23 MED ORDER — HEPARIN SOD (PORK) LOCK FLUSH 100 UNIT/ML IV SOLN
500.0000 [IU] | Freq: Once | INTRAVENOUS | Status: AC | PRN
  Administered 2017-08-23: 500 [IU]
  Filled 2017-08-23: qty 5

## 2017-08-23 MED ORDER — SODIUM CHLORIDE 0.9 % IV SOLN: Freq: Once | INTRAVENOUS | Status: DC

## 2017-08-23 MED ORDER — ONDANSETRON HCL 4 MG/2ML IJ SOLN
INTRAMUSCULAR | Status: AC
  Filled 2017-08-23: qty 2

## 2017-08-23 MED ORDER — ONDANSETRON HCL 4 MG/2ML IJ SOLN
8.0000 mg | Freq: Once | INTRAMUSCULAR | Status: AC
  Administered 2017-08-23: 8 mg via INTRAVENOUS

## 2017-08-23 MED ORDER — SODIUM CHLORIDE 0.9 % IV SOLN
80.0000 mg/m2 | Freq: Once | INTRAVENOUS | Status: AC
  Administered 2017-08-23: 210 mg via INTRAVENOUS
  Filled 2017-08-23: qty 21

## 2017-08-23 MED ORDER — ONDANSETRON HCL 4 MG/2ML IJ SOLN
INTRAMUSCULAR | Status: AC
Start: 2017-08-23 — End: ?
  Filled 2017-08-23: qty 2

## 2017-08-23 MED ORDER — DEXAMETHASONE SODIUM PHOSPHATE 10 MG/ML IJ SOLN: 10.0000 mg | Freq: Once | INTRAMUSCULAR | Status: DC

## 2017-08-23 MED ORDER — FAMOTIDINE IN NACL 20-0.9 MG/50ML-% IV SOLN
INTRAVENOUS | Status: AC
Start: 2017-08-23 — End: ?
  Filled 2017-08-23: qty 100

## 2017-08-23 MED ORDER — SODIUM CHLORIDE 0.9 % IV SOLN
20.0000 mg | Freq: Once | INTRAVENOUS | Status: AC
  Administered 2017-08-23: 20 mg via INTRAVENOUS
  Filled 2017-08-23: qty 2

## 2017-08-23 MED ORDER — SODIUM CHLORIDE 0.9% FLUSH
10.0000 mL | INTRAVENOUS | Status: DC | PRN
  Administered 2017-08-23: 10 mL
  Filled 2017-08-23: qty 10

## 2017-08-23 MED ORDER — SODIUM CHLORIDE 0.9 % IV SOLN
Freq: Once | INTRAVENOUS | Status: AC
  Administered 2017-08-23: 10:00:00 via INTRAVENOUS

## 2017-08-23 MED ORDER — FAMOTIDINE IN NACL 20-0.9 MG/50ML-% IV SOLN
20.0000 mg | Freq: Two times a day (BID) | INTRAVENOUS | Status: DC
  Administered 2017-08-23: 20 mg via INTRAVENOUS

## 2017-08-23 MED ORDER — ONDANSETRON HCL 4 MG/2ML IJ SOLN: 4.0000 mg | Freq: Four times a day (QID) | INTRAMUSCULAR | Status: DC | PRN

## 2017-08-23 NOTE — Patient Instructions (Signed)
[facility] Discharge Instructions for Patients Receiving Chemotherapy  Today you received the following chemotherapy agents:  Taxotere  To help prevent nausea and vomiting after your treatment, we encourage you to take your nausea medication as ordered per MD.    If you develop nausea and vomiting that is not controlled by your nausea medication, call the clinic.   BELOW ARE SYMPTOMS THAT SHOULD BE REPORTED IMMEDIATELY:  *FEVER GREATER THAN 100.5 F  *CHILLS WITH OR WITHOUT FEVER  NAUSEA AND VOMITING THAT IS NOT CONTROLLED WITH YOUR NAUSEA MEDICATION  *UNUSUAL SHORTNESS OF BREATH  *UNUSUAL BRUISING OR BLEEDING  TENDERNESS IN MOUTH AND THROAT WITH OR WITHOUT PRESENCE OF ULCERS  *URINARY PROBLEMS  *BOWEL PROBLEMS  UNUSUAL RASH Items with * indicate a potential emergency and should be followed up as soon as possible.  Feel free to call the clinic should you have any questions or concerns. The clinic phone number is [phone].  Please show the [place] [facility] at check-in to the Emergency Department and triage nurse.

## 2017-08-23 NOTE — Progress Notes (Signed)
Hematology and Oncology Follow Up Visit  EMMETT BRACKNELL [id number] [birth year] 67 y.o. 08/23/2017   Principle Diagnosis:  Metastatic high-grade bladder cancer -- recurrent  Current Therapy:  Atezolizumab 1264m IV q 3 wks - s/p cycle #4 - d/c due to progression.  Taxotere 834mm2 IV q 3 wks - s/p cycle #1     Interim History:  Mr. NoBlankenships here today for follow-up.  He feels okay.  He has had a little bit of shortness of breath.  He says this is getting a little bit better.  He has had a little bit of a dry cough.  He did have a little bit of a low-grade temperature over the weekend but he feels this was probably from dehydration.  He has had no problems with nausea or vomiting.  He is eating okay.  He has had some loose stool.  I think he is taking some Imodium for this.  He has had no mouth sores.  There is been no bleeding.  He has had no leg swelling.  He has had no rashes.  Overall, his performance status is ECOG 1.    Medications:  Allergies as of 08/23/2017   No Known Allergies     Medication List        Accurate as of 08/23/17  9:19 AM. Always use your most recent med list.          ALEVE 220 MG Caps Generic drug:  Naproxen Sodium Take 220 mg by mouth 2 (two) times daily.   amLODipine 10 MG tablet Commonly known as:  NORVASC Take 10 mg by mouth every morning.   amoxicillin 500 MG tablet Commonly known as:  AMOXIL Take 4 tablets 1 hour PRIOR to dental procedure.   bisoprolol-hydrochlorothiazide 10-6.25 MG tablet Commonly known as:  ZIAC Take 1 tablet by mouth daily.   Cinnamon 500 MG Tabs Take 1 tablet by mouth 2 (two) times daily.   dexamethasone 4 MG tablet Commonly known as:  DECADRON Take 2 tablets (8 mg total) by mouth 2 (two) times daily. Start the day before Taxotere. Then daily after chemo for 2 days.   gabapentin 100 MG capsule Commonly known as:  NEURONTIN TAKE 2 CAPSULES BY MOUTH 3 (THREE) TIMES DAILY.   LORazepam 0.5 MG  tablet Commonly known as:  ATIVAN Take 1 tablet (0.5 mg total) by mouth every 6 (six) hours as needed (Nausea or vomiting).   losartan 100 MG tablet Commonly known as:  COZAAR Take 100 mg by mouth daily.   magic mouthwash w/lidocaine Soln Take 5 mLs by mouth 4 (four) times daily.   MAGNESIUM PO Take 400 mg by mouth at bedtime. Leg cramps   ondansetron 8 MG tablet Commonly known as:  ZOFRAN Take 1 tablet (8 mg total) by mouth 2 (two) times daily as needed for refractory nausea / vomiting.   prochlorperazine 10 MG tablet Commonly known as:  COMPAZINE Take 1 tablet (10 mg total) by mouth every 6 (six) hours as needed (Nausea or vomiting).   vitamin B-6 250 MG tablet Take 1 tablet (250 mg total) by mouth daily.       Allergies: No Known Allergies  Past Medical History, Surgical history, Social history, and Family History were reviewed and updated.  Review of Systems: Review of Systems  Constitutional: Negative.   HENT: Negative.   Eyes: Negative.   Respiratory: Negative.   Cardiovascular: Negative.   Gastrointestinal: Negative.   Genitourinary: Negative.   Musculoskeletal: Negative.  Skin: Negative.   Neurological: Negative.   Endo/Heme/Allergies: Negative.   Psychiatric/Behavioral: Negative.      Physical Exam:  weight is 276 lb 8 oz (125.4 kg). His oral temperature is 98.5 F (36.9 C). His blood pressure is 126/48 (abnormal) and his pulse is 83. His respiration is 22 (abnormal) and oxygen saturation is 95%.   Wt Readings from Last 3 Encounters:  08/23/17 276 lb 8 oz (125.4 kg)  07/26/17 285 lb (129.3 kg)  07/05/17 285 lb (129.3 kg)    Physical Exam  Constitutional: He is oriented to person, place, and time.  HENT:  Head: Normocephalic and atraumatic.  Mouth/Throat: Oropharynx is clear and moist.  Eyes: Pupils are equal, round, and reactive to light. EOM are normal.  Neck: Normal range of motion.  Cardiovascular: Normal rate, regular rhythm and normal  heart sounds.  Pulmonary/Chest: Effort normal and breath sounds normal.  Abdominal: Soft. Bowel sounds are normal.  Musculoskeletal: Normal range of motion. He exhibits no edema, tenderness or deformity.  Lymphadenopathy:    He has no cervical adenopathy.  Neurological: He is alert and oriented to person, place, and time.  Skin: Skin is warm and dry. No rash noted. No erythema.  Psychiatric: He has a normal mood and affect. His behavior is normal. Judgment and thought content normal.  Vitals reviewed.   Lab Results  Component Value Date   WBC 7.5 08/23/2017   HGB 10.2 (L) 08/23/2017   HCT 30.6 (L) 08/23/2017   MCV 89.5 08/23/2017   PLT 170 08/23/2017   No results found for: FERRITIN, IRON, TIBC, UIBC, IRONPCTSAT Lab Results  Component Value Date   RBC 3.42 (L) 08/23/2017   No results found for: KPAFRELGTCHN, LAMBDASER, KAPLAMBRATIO No results found for: IGGSERUM, IGA, IGMSERUM No results found for: [name], SPEI   Chemistry      Component Value Date/Time   NA 136 08/23/2017 0845   NA 141 02/21/2017 0902   K 3.8 08/23/2017 0845   K 4.1 02/21/2017 0902   CL 100 08/23/2017 0845   CL 98 02/21/2017 0902   CO2 30 08/23/2017 0845   CO2 31 02/21/2017 0902   BUN 19 08/23/2017 0845   BUN 17 02/21/2017 0902   CREATININE 1.50 (H) 08/23/2017 0845   CREATININE 1.50 (H) 08/02/2017 0819   CREATININE 1.2 02/21/2017 0902      Component Value Date/Time   CALCIUM 9.1 08/23/2017 0845   CALCIUM 9.2 02/21/2017 0902   ALKPHOS 59 08/23/2017 0845   ALKPHOS 65 02/21/2017 0902   AST 37 08/23/2017 0845   AST 34 08/02/2017 0819   ALT 29 08/23/2017 0845   ALT 31 08/02/2017 0819   ALT 18 02/21/2017 0902   BILITOT 0.9 08/23/2017 0845   BILITOT 0.8 08/02/2017 0819      Impression and Plan: Mr. Ehrler is a very pleasant 67 yo caucasian gentleman with metastatic high grade bladder cancer. He has done well with 6 cycles of  Cisplatin/Gemcitabine.  He has last cycle back in October.  We then went ahead and treated him with immunotherapy with Tecentriq.  I thought that he would respond.  However, he just did not have much of a response.  This does surprise me since he had a high TMB value.  We will go with his second cycle of Taxotere.  Hopefully, he will not have any problems with this cycle.  I would like to see him back in about 10 days.  I  want to make sure that he is doing okay.  I want to make sure that his blood counts are not too low.   Volanda Napoleon, MD 5/14/20199:19 AM

## 2017-08-23 NOTE — Progress Notes (Signed)
Pt did not take oral Decadron dose at home yesterday.  Per [name] give Decadron 20 mg IV today prior to Taxotere along with Zofran 8 mg IV, not 4 mg IM.

## 2017-08-25 ENCOUNTER — Other Ambulatory Visit: Payer: Self-pay | Admitting: *Deleted

## 2017-08-25 ENCOUNTER — Inpatient Hospital Stay: Payer: PPO

## 2017-08-25 VITALS — BP 138/82 | HR 79 | Temp 98.0°F | Resp 20

## 2017-08-25 DIAGNOSIS — C772 Secondary and unspecified malignant neoplasm of intra-abdominal lymph nodes: Secondary | ICD-10-CM

## 2017-08-25 DIAGNOSIS — C679 Malignant neoplasm of bladder, unspecified: Secondary | ICD-10-CM

## 2017-08-25 DIAGNOSIS — Z5111 Encounter for antineoplastic chemotherapy: Secondary | ICD-10-CM | POA: Diagnosis not present

## 2017-08-25 MED ORDER — PEGFILGRASTIM-CBQV 6 MG/0.6ML ~~LOC~~ SOSY
6.0000 mg | PREFILLED_SYRINGE | Freq: Once | SUBCUTANEOUS | Status: AC
  Administered 2017-08-25: 6 mg via SUBCUTANEOUS

## 2017-08-25 MED ORDER — PEGFILGRASTIM-CBQV 6 MG/0.6ML ~~LOC~~ SOSY
PREFILLED_SYRINGE | SUBCUTANEOUS | Status: AC
  Filled 2017-08-25: qty 0.6

## 2017-08-29 DIAGNOSIS — L814 Other melanin hyperpigmentation: Secondary | ICD-10-CM | POA: Diagnosis not present

## 2017-08-29 DIAGNOSIS — D485 Neoplasm of uncertain behavior of skin: Secondary | ICD-10-CM | POA: Diagnosis not present

## 2017-08-29 DIAGNOSIS — D224 Melanocytic nevi of scalp and neck: Secondary | ICD-10-CM | POA: Diagnosis not present

## 2017-08-29 DIAGNOSIS — Z85828 Personal history of other malignant neoplasm of skin: Secondary | ICD-10-CM | POA: Diagnosis not present

## 2017-08-29 DIAGNOSIS — D1800 Hemangioma unspecified site: Secondary | ICD-10-CM | POA: Diagnosis not present

## 2017-08-29 DIAGNOSIS — D225 Melanocytic nevi of trunk: Secondary | ICD-10-CM | POA: Diagnosis not present

## 2017-08-29 DIAGNOSIS — L821 Other seborrheic keratosis: Secondary | ICD-10-CM | POA: Diagnosis not present

## 2017-08-29 DIAGNOSIS — L281 Prurigo nodularis: Secondary | ICD-10-CM | POA: Diagnosis not present

## 2017-09-02 ENCOUNTER — Other Ambulatory Visit: Payer: PPO

## 2017-09-02 ENCOUNTER — Inpatient Hospital Stay (HOSPITAL_BASED_OUTPATIENT_CLINIC_OR_DEPARTMENT_OTHER): Payer: PPO | Admitting: Hematology & Oncology

## 2017-09-02 ENCOUNTER — Inpatient Hospital Stay: Payer: PPO

## 2017-09-02 ENCOUNTER — Other Ambulatory Visit: Payer: Self-pay

## 2017-09-02 ENCOUNTER — Ambulatory Visit: Payer: PPO | Admitting: Hematology & Oncology

## 2017-09-02 ENCOUNTER — Encounter: Payer: Self-pay | Admitting: Hematology & Oncology

## 2017-09-02 VITALS — BP 90/53 | HR 85 | Temp 97.9°F | Resp 18 | Wt 270.0 lb

## 2017-09-02 VITALS — BP 89/53 | HR 79 | Temp 97.8°F

## 2017-09-02 DIAGNOSIS — R5383 Other fatigue: Secondary | ICD-10-CM | POA: Diagnosis not present

## 2017-09-02 DIAGNOSIS — Z95828 Presence of other vascular implants and grafts: Secondary | ICD-10-CM

## 2017-09-02 DIAGNOSIS — D500 Iron deficiency anemia secondary to blood loss (chronic): Secondary | ICD-10-CM

## 2017-09-02 DIAGNOSIS — C679 Malignant neoplasm of bladder, unspecified: Secondary | ICD-10-CM | POA: Diagnosis not present

## 2017-09-02 DIAGNOSIS — Z5111 Encounter for antineoplastic chemotherapy: Secondary | ICD-10-CM | POA: Diagnosis not present

## 2017-09-02 DIAGNOSIS — C772 Secondary and unspecified malignant neoplasm of intra-abdominal lymph nodes: Secondary | ICD-10-CM

## 2017-09-02 DIAGNOSIS — D509 Iron deficiency anemia, unspecified: Secondary | ICD-10-CM

## 2017-09-02 LAB — CBC WITH DIFFERENTIAL (CANCER CENTER ONLY)
Basophils Absolute: 0 10*3/uL (ref 0.0–0.1)
Basophils Relative: 0 %
Eosinophils Absolute: 0 10*3/uL (ref 0.0–0.5)
Eosinophils Relative: 0 %
HCT: 30.1 % — ABNORMAL LOW (ref 38.7–49.9)
Hemoglobin: 10 g/dL — ABNORMAL LOW (ref 13.0–17.1)
Lymphocytes Relative: 8 %
Lymphs Abs: 1.5 10*3/uL (ref 0.9–3.3)
MCH: 29.6 pg (ref 28.0–33.4)
MCHC: 33.2 g/dL (ref 32.0–35.9)
MCV: 89.1 fL (ref 82.0–98.0)
Monocytes Absolute: 1.2 10*3/uL — ABNORMAL HIGH (ref 0.1–0.9)
Monocytes Relative: 7 %
Neutro Abs: 15.1 10*3/uL — ABNORMAL HIGH (ref 1.5–6.5)
Neutrophils Relative %: 85 %
Platelet Count: 129 10*3/uL — ABNORMAL LOW (ref 145–400)
RBC: 3.38 MIL/uL — ABNORMAL LOW (ref 4.20–5.70)
RDW: 13.8 % (ref 11.1–15.7)
WBC Count: 17.9 10*3/uL — ABNORMAL HIGH (ref 4.0–10.0)

## 2017-09-02 LAB — IRON AND TIBC
Iron: 29 ug/dL — ABNORMAL LOW (ref 42–163)
Saturation Ratios: 14 % — ABNORMAL LOW (ref 42–163)
TIBC: 213 ug/dL (ref 202–409)
UIBC: 183 ug/dL

## 2017-09-02 LAB — CMP (CANCER CENTER ONLY)
ALT: 36 U/L (ref 10–47)
AST: 39 U/L — ABNORMAL HIGH (ref 11–38)
Albumin: 3 g/dL — ABNORMAL LOW (ref 3.5–5.0)
Alkaline Phosphatase: 101 U/L — ABNORMAL HIGH (ref 26–84)
Anion gap: 8 (ref 5–15)
BUN: 19 mg/dL (ref 7–22)
CO2: 30 mmol/L (ref 18–33)
Calcium: 8.6 mg/dL (ref 8.0–10.3)
Chloride: 94 mmol/L — ABNORMAL LOW (ref 98–108)
Creatinine: 1.3 mg/dL — ABNORMAL HIGH (ref 0.60–1.20)
Glucose, Bld: 161 mg/dL — ABNORMAL HIGH (ref 73–118)
Potassium: 3.7 mmol/L (ref 3.3–4.7)
Sodium: 132 mmol/L (ref 128–145)
Total Bilirubin: 0.9 mg/dL (ref 0.2–1.6)
Total Protein: 5.7 g/dL — ABNORMAL LOW (ref 6.4–8.1)

## 2017-09-02 LAB — FERRITIN: Ferritin: 1680 ng/mL — ABNORMAL HIGH (ref 22–316)

## 2017-09-02 LAB — SAMPLE TO BLOOD BANK

## 2017-09-02 LAB — LACTATE DEHYDROGENASE: LDH: 384 U/L — ABNORMAL HIGH (ref 125–245)

## 2017-09-02 MED ORDER — SODIUM CHLORIDE 0.9% FLUSH
10.0000 mL | INTRAVENOUS | Status: DC | PRN
  Filled 2017-09-02: qty 10

## 2017-09-02 MED ORDER — SODIUM CHLORIDE 0.9 % IV SOLN
1000.0000 mL | Freq: Once | INTRAVENOUS | Status: AC
  Administered 2017-09-02: 1000 mL via INTRAVENOUS

## 2017-09-02 MED ORDER — SODIUM CHLORIDE 0.9% FLUSH
10.0000 mL | INTRAVENOUS | Status: DC | PRN
  Administered 2017-09-02: 10 mL via INTRAVENOUS
  Filled 2017-09-02: qty 10

## 2017-09-02 MED ORDER — SODIUM CHLORIDE 0.9 % IV SOLN
20.0000 mg | Freq: Once | INTRAVENOUS | Status: AC
  Administered 2017-09-02: 20 mg via INTRAVENOUS
  Filled 2017-09-02: qty 2

## 2017-09-02 MED ORDER — HEPARIN SOD (PORK) LOCK FLUSH 100 UNIT/ML IV SOLN
500.0000 [IU] | Freq: Once | INTRAVENOUS | Status: DC
  Filled 2017-09-02: qty 5

## 2017-09-02 MED ORDER — HEPARIN SOD (PORK) LOCK FLUSH 100 UNIT/ML IV SOLN
500.0000 [IU] | Freq: Once | INTRAVENOUS | Status: AC
  Administered 2017-09-02: 500 [IU] via INTRAVENOUS
  Filled 2017-09-02: qty 5

## 2017-09-02 NOTE — Patient Instructions (Signed)
Dehydration, Adult Dehydration is a condition in which there is not enough fluid or water in the body. This happens when you lose more fluids than you take in. Important organs, such as the kidneys, brain, and heart, cannot function without a proper amount of fluids. Any loss of fluids from the body can lead to dehydration. Dehydration can range from mild to severe. This condition should be treated right away to prevent it from becoming severe. What are the causes? This condition may be caused by:  Vomiting.  Diarrhea.  Excessive sweating, such as from heat exposure or exercise.  Not drinking enough fluid, especially: ? When ill. ? While doing activity that requires a lot of energy.  Excessive urination.  Fever.  Infection.  Certain medicines, such as medicines that cause the body to lose excess fluid (diuretics).  Inability to access safe drinking water.  Reduced physical ability to get adequate water and food.  What increases the risk? This condition is more likely to develop in people:  Who have a poorly controlled long-term (chronic) illness, such as diabetes, heart disease, or kidney disease.  Who are age 65 or older.  Who are disabled.  Who live in a place with high altitude.  Who play endurance sports.  What are the signs or symptoms? Symptoms of mild dehydration may include:  Thirst.  Dry lips.  Slightly dry mouth.  Dry, warm skin.  Dizziness. Symptoms of moderate dehydration may include:  Very dry mouth.  Muscle cramps.  Dark urine. Urine may be the color of tea.  Decreased urine production.  Decreased tear production.  Heartbeat that is irregular or faster than normal (palpitations).  Headache.  Light-headedness, especially when you stand up from a sitting position.  Fainting (syncope). Symptoms of severe dehydration may include:  Changes in skin, such as: ? Cold and clammy skin. ? Blotchy (mottled) or pale skin. ? Skin that does  not quickly return to normal after being lightly pinched and released (poor skin turgor).  Changes in body fluids, such as: ? Extreme thirst. ? No tear production. ? Inability to sweat when body temperature is high, such as in hot weather. ? Very little urine production.  Changes in vital signs, such as: ? Weak pulse. ? Pulse that is more than 100 beats a minute when sitting still. ? Rapid breathing. ? Low blood pressure.  Other changes, such as: ? Sunken eyes. ? Cold hands and feet. ? Confusion. ? Lack of energy (lethargy). ? Difficulty waking up from sleep. ? Short-term weight loss. ? Unconsciousness. How is this diagnosed? This condition is diagnosed based on your symptoms and a physical exam. Blood and urine tests may be done to help confirm the diagnosis. How is this treated? Treatment for this condition depends on the severity. Mild or moderate dehydration can often be treated at home. Treatment should be started right away. Do not wait until dehydration becomes severe. Severe dehydration is an emergency and it needs to be treated in a hospital. Treatment for mild dehydration may include:  Drinking more fluids.  Replacing salts and minerals in your blood (electrolytes) that you may have lost. Treatment for moderate dehydration may include:  Drinking an oral rehydration solution (ORS). This is a drink that helps you replace fluids and electrolytes (rehydrate). It can be found at pharmacies and retail stores. Treatment for severe dehydration may include:  Receiving fluids through an IV tube.  Receiving an electrolyte solution through a feeding tube that is passed through your nose   and into your stomach (nasogastric tube, or NG tube).  Correcting any abnormalities in electrolytes.  Treating the underlying cause of dehydration. Follow these instructions at home:  If directed by your health care provider, drink an ORS: ? Make an ORS by following instructions on the  package. ? Start by drinking small amounts, about  cup (120 mL) every 5-10 minutes. ? Slowly increase how much you drink until you have taken the amount recommended by your health care provider.  Drink enough clear fluid to keep your urine clear or pale yellow. If you were told to drink an ORS, finish the ORS first, then start slowly drinking other clear fluids. Drink fluids such as: ? Water. Do not drink only water. Doing that can lead to having too little salt (sodium) in the body (hyponatremia). ? Ice chips. ? Fruit juice that you have added water to (diluted fruit juice). ? Low-calorie sports drinks.  Avoid: ? Alcohol. ? Drinks that contain a lot of sugar. These include high-calorie sports drinks, fruit juice that is not diluted, and soda. ? Caffeine. ? Foods that are greasy or contain a lot of fat or sugar.  Take over-the-counter and prescription medicines only as told by your health care provider.  Do not take sodium tablets. This can lead to having too much sodium in the body (hypernatremia).  Eat foods that contain a healthy balance of electrolytes, such as bananas, oranges, potatoes, tomatoes, and spinach.  Keep all follow-up visits as told by your health care provider. This is important. Contact a health care provider if:  You have abdominal pain that: ? Gets worse. ? Stays in one area (localizes).  You have a rash.  You have a stiff neck.  You are more irritable than usual.  You are sleepier or more difficult to wake up than usual.  You feel weak or dizzy.  You feel very thirsty.  You have urinated only a small amount of very dark urine over 6-8 hours. Get help right away if:  You have symptoms of severe dehydration.  You cannot drink fluids without vomiting.  Your symptoms get worse with treatment.  You have a fever.  You have a severe headache.  You have vomiting or diarrhea that: ? Gets worse. ? Does not go away.  You have blood or green matter  (bile) in your vomit.  You have blood in your stool. This may cause stool to look black and tarry.  You have not urinated in 6-8 hours.  You faint.  Your heart rate while sitting still is over 100 beats a minute.  You have trouble breathing. This information is not intended to replace advice given to you by your health care provider. Make sure you discuss any questions you have with your health care provider. Document Released: 03/29/2005 Document Revised: 10/24/2015 Document Reviewed: 05/23/2015 Elsevier Interactive Patient Education  2018 [employer]  

## 2017-09-02 NOTE — Progress Notes (Signed)
Hematology and Oncology Follow Up Visit  [name] Rose [id number] March 22, 1951 67 y.o. 09/02/2017   Principle Diagnosis:  Metastatic high-grade bladder cancer -- recurrent  Current Therapy:  Atezolizumab $RemoveBefor'1200mg'WYMWITmtVZgz$  IV q 3 wks - s/p cycle #4 - d/c due to progression.  Taxotere $RemoveB'80mg'WzxJpsbw$ /m2 IV q 3 wks - s/p cycle #2    Interim History:  [name] Rose is here today for follow-up.  [name] Rose is not feeling well at all.  [name] Rose had his chemotherapy last week.  [name] Rose just has no energy.  [name] Rose is not eating as much.  [name] Rose try to play golf but had a hard time finishing the round.  I do not know if the Neulasta shot that [name] Rose got was the problem.  [name] Rose seemed to have issues after getting the Neulasta injection.  [name] Rose has had no bleeding.  [name] Rose has had some occasional diarrhea.  [name] Rose has had no rashes.  [name] Rose has had no fever.  [name] Rose has had little bit of a dry cough.  His wife comes in with him today.  I knew once I saw her that there would be problems with him.  [name] Rose was comes in by himself because [name] Rose usually is doing okay.  I would have to say that his overall performance status is ECOG 1.  Medications:  Allergies as of 09/02/2017   No Known Allergies     Medication List        Accurate as of 09/02/17 10:07 AM. Always use your most recent med list.          ALEVE 220 MG Caps Generic drug:  Naproxen Sodium Take 220 mg by mouth 2 (two) times daily.   amLODipine 10 MG tablet Commonly known as:  NORVASC Take 10 mg by mouth every morning.   amoxicillin 500 MG tablet Commonly known as:  AMOXIL Take 4 tablets 1 hour PRIOR to dental procedure.   bisoprolol-hydrochlorothiazide 10-6.25 MG tablet Commonly known as:  ZIAC Take 1 tablet by mouth daily.   Cinnamon 500 MG Tabs Take 1 tablet by mouth 2 (two) times daily.   dexamethasone 4 MG tablet Commonly known as:  DECADRON Take 2 tablets (8 mg total) by mouth 2 (two) times daily. Start the day before Taxotere. Then daily after chemo for 2 days.   gabapentin 100 MG  capsule Commonly known as:  NEURONTIN TAKE 2 CAPSULES BY MOUTH 3 (THREE) TIMES DAILY.   LORazepam 0.5 MG tablet Commonly known as:  ATIVAN Take 1 tablet (0.5 mg total) by mouth every 6 (six) hours as needed (Nausea or vomiting).   losartan 100 MG tablet Commonly known as:  COZAAR Take 100 mg by mouth daily.   magic mouthwash w/lidocaine Soln Take 5 mLs by mouth 4 (four) times daily.   MAGNESIUM PO Take 400 mg by mouth at bedtime. Leg cramps   ondansetron 8 MG tablet Commonly known as:  ZOFRAN Take 1 tablet (8 mg total) by mouth 2 (two) times daily as needed for refractory nausea / vomiting.   prochlorperazine 10 MG tablet Commonly known as:  COMPAZINE Take 1 tablet (10 mg total) by mouth every 6 (six) hours as needed (Nausea or vomiting).   vitamin B-6 250 MG tablet Take 1 tablet (250 mg total) by mouth daily.       Allergies: No Known Allergies  Past Medical History, Surgical history, Social history, and Family History were reviewed and updated.  Review of Systems: Review of Systems  Constitutional: Negative.   HENT: Negative.  Eyes: Negative.   Respiratory: Negative.   Cardiovascular: Negative.   Gastrointestinal: Negative.   Genitourinary: Negative.   Musculoskeletal: Negative.   Skin: Negative.   Neurological: Negative.   Endo/Heme/Allergies: Negative.   Psychiatric/Behavioral: Negative.      Physical Exam:  weight is 270 lb (122.5 kg). His oral temperature is 97.9 F (36.6 C). His blood pressure is 90/53 (abnormal) and his pulse is 85. His respiration is 18 and oxygen saturation is 95%.   Wt Readings from Last 3 Encounters:  09/02/17 270 lb (122.5 kg)  08/23/17 276 lb 8 oz (125.4 kg)  07/26/17 285 lb (129.3 kg)    Physical Exam  Constitutional: [name] Rose is oriented to person, place, and time.  HENT:  Head: Normocephalic and atraumatic.  Mouth/Throat: Oropharynx is clear and moist.  Eyes: Pupils are equal, round, and reactive to light. EOM are  normal.  Neck: Normal range of motion.  Cardiovascular: Normal rate, regular rhythm and normal heart sounds.  Pulmonary/Chest: Effort normal and breath sounds normal.  Abdominal: Soft. Bowel sounds are normal.  Musculoskeletal: Normal range of motion. [name] Rose exhibits no edema, tenderness or deformity.  Lymphadenopathy:    [name] Rose has no cervical adenopathy.  Neurological: [name] Rose is alert and oriented to person, place, and time.  Skin: Skin is warm and dry. No rash noted. No erythema.  Psychiatric: [name] Rose has a normal mood and affect. His behavior is normal. Judgment and thought content normal.  Vitals reviewed.   Lab Results  Component Value Date   WBC 17.9 (H) 09/02/2017   HGB 10.0 (L) 09/02/2017   HCT 30.1 (L) 09/02/2017   MCV 89.1 09/02/2017   PLT 129 (L) 09/02/2017   No results found for: FERRITIN, IRON, TIBC, UIBC, IRONPCTSAT Lab Results  Component Value Date   RBC 3.38 (L) 09/02/2017   No results found for: KPAFRELGTCHN, LAMBDASER, KAPLAMBRATIO No results found for: IGGSERUM, IGA, IGMSERUM No results found for: Marda Stalker, SPEI   Chemistry      Component Value Date/Time   NA 132 09/02/2017 0846   NA 141 02/21/2017 0902   K 3.7 09/02/2017 0846   K 4.1 02/21/2017 0902   CL 94 (L) 09/02/2017 0846   CL 98 02/21/2017 0902   CO2 30 09/02/2017 0846   CO2 31 02/21/2017 0902   BUN 19 09/02/2017 0846   BUN 17 02/21/2017 0902   CREATININE 1.30 (H) 09/02/2017 0846   CREATININE 1.2 02/21/2017 0902      Component Value Date/Time   CALCIUM 8.6 09/02/2017 0846   CALCIUM 9.2 02/21/2017 0902   ALKPHOS 101 (H) 09/02/2017 0846   ALKPHOS 65 02/21/2017 0902   AST 39 (H) 09/02/2017 0846   ALT 36 09/02/2017 0846   ALT 18 02/21/2017 0902   BILITOT 0.9 09/02/2017 0846      Impression and Plan: [name] Rose is a very pleasant 67 yo caucasian gentleman with metastatic high grade bladder cancer. [name] Rose has done well with 6 cycles of  Cisplatin/Gemcitabine.  [name] Rose has last cycle back in October.  At this point, we will go ahead and give some IV fluids.  I will try little bit of Decadron also.  Maybe this will help with his appetite.  I think we will have to do a PET scan on him after this second cycle.  Hopefully, his complaints are not from progressive cancer.  We will plan to get him back in 2 weeks.  I want to give an extra week off  before we potentially treat him again.  Volanda Napoleon, MD 5/24/201910:07 AM

## 2017-09-06 ENCOUNTER — Telehealth: Payer: Self-pay | Admitting: *Deleted

## 2017-09-06 NOTE — Telephone Encounter (Addendum)
Patient is aware of results. Appointment made  ----- Message from Volanda Napoleon, MD sent at 09/02/2017  4:37 PM EDT ----- Call - the iron is low!!  He needs a dose of IV iron and this will make him feel better!!  [name]

## 2017-09-08 ENCOUNTER — Inpatient Hospital Stay: Payer: PPO

## 2017-09-08 ENCOUNTER — Other Ambulatory Visit: Payer: Self-pay | Admitting: Family

## 2017-09-08 ENCOUNTER — Other Ambulatory Visit: Payer: Self-pay

## 2017-09-08 VITALS — BP 111/55 | HR 105 | Temp 98.1°F | Resp 18

## 2017-09-08 DIAGNOSIS — D508 Other iron deficiency anemias: Secondary | ICD-10-CM

## 2017-09-08 DIAGNOSIS — D509 Iron deficiency anemia, unspecified: Secondary | ICD-10-CM | POA: Insufficient documentation

## 2017-09-08 DIAGNOSIS — Z5111 Encounter for antineoplastic chemotherapy: Secondary | ICD-10-CM | POA: Diagnosis not present

## 2017-09-08 MED ORDER — SODIUM CHLORIDE 0.9 % IV SOLN
Freq: Once | INTRAVENOUS | Status: AC
  Administered 2017-09-08: 11:00:00 via INTRAVENOUS

## 2017-09-08 MED ORDER — SODIUM CHLORIDE 0.9 % IJ SOLN
10.0000 mL | Freq: Once | INTRAMUSCULAR | Status: AC
  Administered 2017-09-08: 10 mL
  Filled 2017-09-08: qty 10

## 2017-09-08 MED ORDER — FERUMOXYTOL INJECTION 510 MG/17 ML
510.0000 mg | Freq: Once | INTRAVENOUS | Status: AC
  Administered 2017-09-08: 510 mg via INTRAVENOUS
  Filled 2017-09-08: qty 17

## 2017-09-08 MED ORDER — HEPARIN SOD (PORK) LOCK FLUSH 100 UNIT/ML IV SOLN
500.0000 [IU] | Freq: Once | INTRAVENOUS | Status: AC
  Administered 2017-09-08: 500 [IU] via INTRAVENOUS
  Filled 2017-09-08: qty 5

## 2017-09-08 NOTE — Patient Instructions (Signed)

## 2017-09-13 ENCOUNTER — Ambulatory Visit: Payer: PPO

## 2017-09-13 ENCOUNTER — Ambulatory Visit (HOSPITAL_COMMUNITY)
Admission: RE | Admit: 2017-09-13 | Discharge: 2017-09-13 | Disposition: A | Discharge status: Home / Self Care | Payer: PPO | Source: Ambulatory Visit | Attending: Hematology & Oncology | Admitting: Hematology & Oncology

## 2017-09-13 ENCOUNTER — Other Ambulatory Visit: Payer: PPO

## 2017-09-13 DIAGNOSIS — C679 Malignant neoplasm of bladder, unspecified: Secondary | ICD-10-CM | POA: Insufficient documentation

## 2017-09-13 DIAGNOSIS — C772 Secondary and unspecified malignant neoplasm of intra-abdominal lymph nodes: Secondary | ICD-10-CM | POA: Diagnosis not present

## 2017-09-13 DIAGNOSIS — R599 Enlarged lymph nodes, unspecified: Secondary | ICD-10-CM | POA: Diagnosis not present

## 2017-09-13 DIAGNOSIS — Z79899 Other long term (current) drug therapy: Secondary | ICD-10-CM | POA: Insufficient documentation

## 2017-09-13 DIAGNOSIS — R918 Other nonspecific abnormal finding of lung field: Secondary | ICD-10-CM | POA: Insufficient documentation

## 2017-09-13 LAB — GLUCOSE, CAPILLARY: Glucose-Capillary: 133 mg/dL — ABNORMAL HIGH (ref 65–99)

## 2017-09-13 MED ORDER — FLUDEOXYGLUCOSE F - 18 (FDG) INJECTION
13.5000 | Freq: Once | INTRAVENOUS | Status: AC
  Administered 2017-09-13: 13.5 via INTRAVENOUS

## 2017-09-14 ENCOUNTER — Telehealth: Payer: Self-pay | Admitting: *Deleted

## 2017-09-14 NOTE — Telephone Encounter (Addendum)
Patient is aware of results  ----- Message from Volanda Napoleon, MD sent at 09/14/2017  6:24 AM EDT ----- Call - everything looks stable!!  NO new areas of cancer!!! What you have already might be a little less active!!  [name] Rose

## 2017-09-15 ENCOUNTER — Ambulatory Visit: Payer: PPO

## 2017-09-16 DIAGNOSIS — G629 Polyneuropathy, unspecified: Secondary | ICD-10-CM | POA: Diagnosis not present

## 2017-09-16 DIAGNOSIS — R7303 Prediabetes: Secondary | ICD-10-CM | POA: Diagnosis not present

## 2017-09-16 DIAGNOSIS — E7800 Pure hypercholesterolemia, unspecified: Secondary | ICD-10-CM | POA: Diagnosis not present

## 2017-09-16 DIAGNOSIS — Z8546 Personal history of malignant neoplasm of prostate: Secondary | ICD-10-CM | POA: Diagnosis not present

## 2017-09-16 DIAGNOSIS — C679 Malignant neoplasm of bladder, unspecified: Secondary | ICD-10-CM | POA: Diagnosis not present

## 2017-09-16 DIAGNOSIS — I10 Essential (primary) hypertension: Secondary | ICD-10-CM | POA: Diagnosis not present

## 2017-09-16 DIAGNOSIS — C772 Secondary and unspecified malignant neoplasm of intra-abdominal lymph nodes: Secondary | ICD-10-CM | POA: Diagnosis not present

## 2017-09-20 ENCOUNTER — Other Ambulatory Visit: Payer: Self-pay

## 2017-09-20 ENCOUNTER — Inpatient Hospital Stay (HOSPITAL_BASED_OUTPATIENT_CLINIC_OR_DEPARTMENT_OTHER): Payer: PPO | Admitting: Hematology & Oncology

## 2017-09-20 ENCOUNTER — Inpatient Hospital Stay: Payer: PPO | Attending: Hematology & Oncology

## 2017-09-20 ENCOUNTER — Other Ambulatory Visit (HOSPITAL_COMMUNITY): Payer: Self-pay | Admitting: Pharmacist

## 2017-09-20 ENCOUNTER — Inpatient Hospital Stay: Payer: PPO

## 2017-09-20 ENCOUNTER — Encounter: Payer: Self-pay | Admitting: Hematology & Oncology

## 2017-09-20 ENCOUNTER — Other Ambulatory Visit: Payer: Self-pay | Admitting: Hematology & Oncology

## 2017-09-20 ENCOUNTER — Other Ambulatory Visit: Payer: Self-pay | Admitting: *Deleted

## 2017-09-20 VITALS — BP 146/81 | HR 101 | Temp 97.8°F | Wt 270.1 lb

## 2017-09-20 DIAGNOSIS — Z5111 Encounter for antineoplastic chemotherapy: Secondary | ICD-10-CM | POA: Diagnosis not present

## 2017-09-20 DIAGNOSIS — C772 Secondary and unspecified malignant neoplasm of intra-abdominal lymph nodes: Secondary | ICD-10-CM

## 2017-09-20 DIAGNOSIS — D509 Iron deficiency anemia, unspecified: Secondary | ICD-10-CM | POA: Insufficient documentation

## 2017-09-20 DIAGNOSIS — Z79899 Other long term (current) drug therapy: Secondary | ICD-10-CM | POA: Insufficient documentation

## 2017-09-20 DIAGNOSIS — C679 Malignant neoplasm of bladder, unspecified: Secondary | ICD-10-CM

## 2017-09-20 DIAGNOSIS — D500 Iron deficiency anemia secondary to blood loss (chronic): Secondary | ICD-10-CM

## 2017-09-20 DIAGNOSIS — R918 Other nonspecific abnormal finding of lung field: Secondary | ICD-10-CM

## 2017-09-20 LAB — CBC WITH DIFFERENTIAL (CANCER CENTER ONLY)
Basophils Absolute: 0 10*3/uL (ref 0.0–0.1)
Basophils Relative: 0 %
Eosinophils Absolute: 0 10*3/uL (ref 0.0–0.5)
Eosinophils Relative: 0 %
HCT: 31.9 % — ABNORMAL LOW (ref 38.7–49.9)
Hemoglobin: 10.1 g/dL — ABNORMAL LOW (ref 13.0–17.1)
Lymphocytes Relative: 11 %
Lymphs Abs: 1.3 10*3/uL (ref 0.9–3.3)
MCH: 28.9 pg (ref 28.0–33.4)
MCHC: 31.7 g/dL — ABNORMAL LOW (ref 32.0–35.9)
MCV: 91.1 fL (ref 82.0–98.0)
Monocytes Absolute: 0.4 10*3/uL (ref 0.1–0.9)
Monocytes Relative: 3 %
Neutro Abs: 10.4 10*3/uL — ABNORMAL HIGH (ref 1.5–6.5)
Neutrophils Relative %: 86 %
Platelet Count: 258 10*3/uL (ref 145–400)
RBC: 3.5 MIL/uL — ABNORMAL LOW (ref 4.20–5.70)
RDW: 16 % — ABNORMAL HIGH (ref 11.1–15.7)
WBC Count: 12 10*3/uL — ABNORMAL HIGH (ref 4.0–10.0)

## 2017-09-20 LAB — IRON AND TIBC
Iron: 65 ug/dL (ref 42–163)
Saturation Ratios: 25 % — ABNORMAL LOW (ref 42–163)
TIBC: 255 ug/dL (ref 202–409)
UIBC: 190 ug/dL

## 2017-09-20 LAB — CMP (CANCER CENTER ONLY)
ALT: 32 U/L (ref 10–47)
AST: 34 U/L (ref 11–38)
Albumin: 3 g/dL — ABNORMAL LOW (ref 3.5–5.0)
Alkaline Phosphatase: 67 U/L (ref 26–84)
Anion gap: 12 (ref 5–15)
BUN: 31 mg/dL — ABNORMAL HIGH (ref 7–22)
CO2: 25 mmol/L (ref 18–33)
Calcium: 9.3 mg/dL (ref 8.0–10.3)
Chloride: 102 mmol/L (ref 98–108)
Creatinine: 1.4 mg/dL — ABNORMAL HIGH (ref 0.60–1.20)
Glucose, Bld: 219 mg/dL — ABNORMAL HIGH (ref 73–118)
Potassium: 4 mmol/L (ref 3.3–4.7)
Sodium: 139 mmol/L (ref 128–145)
Total Bilirubin: 0.7 mg/dL (ref 0.2–1.6)
Total Protein: 6.2 g/dL — ABNORMAL LOW (ref 6.4–8.1)

## 2017-09-20 LAB — LACTATE DEHYDROGENASE: LDH: 442 U/L — ABNORMAL HIGH (ref 125–245)

## 2017-09-20 LAB — FERRITIN: Ferritin: 1087 ng/mL — ABNORMAL HIGH (ref 22–316)

## 2017-09-20 MED ORDER — HEPARIN SOD (PORK) LOCK FLUSH 100 UNIT/ML IV SOLN
500.0000 [IU] | Freq: Once | INTRAVENOUS | Status: AC | PRN
  Administered 2017-09-20: 500 [IU]
  Filled 2017-09-20: qty 5

## 2017-09-20 MED ORDER — CIPROFLOXACIN HCL 500 MG PO TABS: 500.0000 mg | ORAL_TABLET | Freq: Two times a day (BID) | ORAL | 2 refills | Status: DC

## 2017-09-20 MED ORDER — DEXAMETHASONE SODIUM PHOSPHATE 10 MG/ML IJ SOLN
10.0000 mg | Freq: Once | INTRAMUSCULAR | Status: AC
  Administered 2017-09-20: 10 mg via INTRAVENOUS

## 2017-09-20 MED ORDER — SODIUM CHLORIDE 0.9 % IV SOLN
80.0000 mg/m2 | Freq: Once | INTRAVENOUS | Status: AC
  Administered 2017-09-20: 210 mg via INTRAVENOUS
  Filled 2017-09-20: qty 21

## 2017-09-20 MED ORDER — DEXAMETHASONE SODIUM PHOSPHATE 10 MG/ML IJ SOLN
INTRAMUSCULAR | Status: AC
  Filled 2017-09-20: qty 1

## 2017-09-20 MED ORDER — GABAPENTIN 100 MG PO CAPS: 300.0000 mg | ORAL_CAPSULE | Freq: Two times a day (BID) | ORAL | 0 refills | Status: DC

## 2017-09-20 MED ORDER — SODIUM CHLORIDE 0.9% FLUSH
10.0000 mL | INTRAVENOUS | Status: DC | PRN
  Administered 2017-09-20: 10 mL
  Filled 2017-09-20: qty 10

## 2017-09-20 MED ORDER — SODIUM CHLORIDE 0.9 % IV SOLN
Freq: Once | INTRAVENOUS | Status: AC
  Administered 2017-09-20: 10:00:00 via INTRAVENOUS

## 2017-09-20 NOTE — Progress Notes (Signed)
Hematology and Oncology Follow Up Visit  [name] Rose [id number] November 30, 1950 67 y.o. 09/20/2017   Principle Diagnosis:  Metastatic high-grade bladder cancer -- recurrent  Current Therapy:  Atezolizumab $RemoveBefor'1200mg'qcNetMDfdYce$  IV q 3 wks - s/p cycle #4 - d/c due to progression.  Taxotere $RemoveB'80mg'[place]$ /m2 IV q 3 wks - s/p cycle #2    Interim History:  [name] Rose is here today for follow-up.  He is feeling better.  He has a little bit more energy.  We did find that he had some iron deficiency.  When we checked his iron a couple weeks ago, his ferritin was quite high at 1600 but his iron saturation was only 14%.  We gave him a dose of IV iron and this helped him.  He did have a PET scan done.  This was done last week.  The PET scan basically showed stable disease.  He had no new areas of activity.  His lymph nodes all were still active, some a little bit more, some a little bit less.Marland Kitchen  He did have some activity in his lungs that look like it was inflammation.  He has had no fever.  He has had no diarrhea.  Has had no mouth sores.  He has had no leg swelling.  He wants to get out and play golf again.  He is working his way back to that level.  I would have to say that his overall performance status is ECOG 1.  Medications:  Allergies as of 09/20/2017   No Known Allergies     Medication List        Accurate as of 09/20/17  9:22 AM. Always use your most recent med list.          ALEVE 220 MG Caps Generic drug:  Naproxen Sodium Take 220 mg by mouth 2 (two) times daily.   Cinnamon 500 MG Tabs Take 1 tablet by mouth 2 (two) times daily.   dexamethasone 4 MG tablet Commonly known as:  DECADRON Take 2 tablets (8 mg total) by mouth 2 (two) times daily. Start the day before Taxotere. Then daily after chemo for 2 days.   gabapentin 100 MG capsule Commonly known as:  NEURONTIN Take 3 capsules (300 mg total) by mouth 2 (two) times daily.   LORazepam 0.5 MG tablet Commonly known as:  ATIVAN Take 1  tablet (0.5 mg total) by mouth every 6 (six) hours as needed (Nausea or vomiting).   magic mouthwash w/lidocaine Soln Take 5 mLs by mouth 4 (four) times daily.   MAGNESIUM PO Take 400 mg by mouth at bedtime. Leg cramps   ondansetron 8 MG tablet Commonly known as:  ZOFRAN Take 1 tablet (8 mg total) by mouth 2 (two) times daily as needed for refractory nausea / vomiting.   prochlorperazine 10 MG tablet Commonly known as:  COMPAZINE Take 1 tablet (10 mg total) by mouth every 6 (six) hours as needed (Nausea or vomiting).   vitamin B-6 250 MG tablet Take 1 tablet (250 mg total) by mouth daily.       Allergies: No Known Allergies  Past Medical History, Surgical history, Social history, and Family History were reviewed and updated.  Review of Systems: Review of Systems  Constitutional: Negative.   HENT: Negative.   Eyes: Negative.   Respiratory: Negative.   Cardiovascular: Negative.   Gastrointestinal: Negative.   Genitourinary: Negative.   Musculoskeletal: Negative.   Skin: Negative.   Neurological: Negative.   Endo/Heme/Allergies: Negative.   Psychiatric/Behavioral: Negative.  Physical Exam:  weight is 270 lb 1.9 oz (122.5 kg). His oral temperature is 97.8 F (36.6 C). His blood pressure is 146/81 (abnormal) and his pulse is 101 (abnormal). His oxygen saturation is 98%.   Wt Readings from Last 3 Encounters:  09/20/17 270 lb 1.9 oz (122.5 kg)  09/02/17 270 lb (122.5 kg)  08/23/17 276 lb 8 oz (125.4 kg)    Physical Exam  Constitutional: He is oriented to person, place, and time.  HENT:  Head: Normocephalic and atraumatic.  Mouth/Throat: Oropharynx is clear and moist.  Eyes: Pupils are equal, round, and reactive to light. EOM are normal.  Neck: Normal range of motion.  Cardiovascular: Normal rate, regular rhythm and normal heart sounds.  Pulmonary/Chest: Effort normal and breath sounds normal.  Abdominal: Soft. Bowel sounds are normal.  Musculoskeletal:  Normal range of motion. He exhibits no edema, tenderness or deformity.  Lymphadenopathy:    He has no cervical adenopathy.  Neurological: He is alert and oriented to person, place, and time.  Skin: Skin is warm and dry. No rash noted. No erythema.  Psychiatric: He has a normal mood and affect. His behavior is normal. Judgment and thought content normal.  Vitals reviewed.   Lab Results  Component Value Date   WBC 17.9 (H) 09/02/2017   HGB 10.0 (L) 09/02/2017   HCT 30.1 (L) 09/02/2017   MCV 89.1 09/02/2017   PLT 129 (L) 09/02/2017   Lab Results  Component Value Date   FERRITIN 1,680 (H) 09/02/2017   IRON 29 (L) 09/02/2017   TIBC 213 09/02/2017   UIBC 183 09/02/2017   IRONPCTSAT 14 (L) 09/02/2017   Lab Results  Component Value Date   RBC 3.38 (L) 09/02/2017   No results found for: KPAFRELGTCHN, LAMBDASER, KAPLAMBRATIO No results found for: IGGSERUM, IGA, IGMSERUM No results found for: [name], SPEI   Chemistry      Component Value Date/Time   NA 132 09/02/2017 0846   NA 141 02/21/2017 0902   K 3.7 09/02/2017 0846   K 4.1 02/21/2017 0902   CL 94 (L) 09/02/2017 0846   CL 98 02/21/2017 0902   CO2 30 09/02/2017 0846   CO2 31 02/21/2017 0902   BUN 19 09/02/2017 0846   BUN 17 02/21/2017 0902   CREATININE 1.30 (H) 09/02/2017 0846   CREATININE 1.2 02/21/2017 0902      Component Value Date/Time   CALCIUM 8.6 09/02/2017 0846   CALCIUM 9.2 02/21/2017 0902   ALKPHOS 101 (H) 09/02/2017 0846   ALKPHOS 65 02/21/2017 0902   AST 39 (H) 09/02/2017 0846   ALT 36 09/02/2017 0846   ALT 18 02/21/2017 0902   BILITOT 0.9 09/02/2017 0846      Impression and Plan: [name] Rose is a very pleasant 67 yo caucasian gentleman with metastatic high grade bladder cancer. He has done well with 6 cycles of Cisplatin/Gemcitabine.  He has last cycle back in October.  We will go ahead and treat him today.  This will be his third cycle of  treatment.  I will then plan for him to come back in 3 weeks for his fourth cycle of treatment.  We probably will have to do a PET scan after his fourth cycle of treatment.  I am going to hold his Neulasta with this cycle.  I am not sure if Neulasta caused some of his problems last time.  I will make sure that he is on some antibiotics.  Collier Salina  Oletha Cruel, MD 6/11/20199:22 AM

## 2017-09-22 ENCOUNTER — Inpatient Hospital Stay: Payer: PPO

## 2017-10-03 ENCOUNTER — Other Ambulatory Visit: Payer: Self-pay | Admitting: Hematology & Oncology

## 2017-10-03 ENCOUNTER — Telehealth: Payer: Self-pay | Admitting: *Deleted

## 2017-10-03 MED ORDER — GELCLAIR MT GEL: 1.0000 | Freq: Three times a day (TID) | OROMUCOSAL | 3 refills | Status: DC

## 2017-10-03 NOTE — Telephone Encounter (Addendum)
Patient has a question about his prophylactic Cipro. He would like to know how long he should be taking this medication. He is on it, as prophylaxis due to him not tolerating the Neulasta injections.  Reviewed with Dr Marin Olp. He needs to take the medication for 10 days following each cycle of chemotherapy. Patient understands instructions.   Patient also c/o his mouth being tender in the back towards his throat. He doesn't have any open sores but it is sore and uncomfortable. He is taking magic mouthwash as prescribed without improvement.   Dr Marin Olp would like to try the patient on gelclair. Patient is aware of new prescription. Pharmacy confirmed.

## 2017-10-04 ENCOUNTER — Other Ambulatory Visit: Payer: PPO

## 2017-10-04 ENCOUNTER — Ambulatory Visit: Payer: PPO | Admitting: Hematology & Oncology

## 2017-10-04 ENCOUNTER — Ambulatory Visit: Payer: PPO

## 2017-10-06 ENCOUNTER — Ambulatory Visit: Payer: PPO

## 2017-10-09 ENCOUNTER — Other Ambulatory Visit: Payer: Self-pay | Admitting: Hematology & Oncology

## 2017-10-10 ENCOUNTER — Inpatient Hospital Stay (HOSPITAL_BASED_OUTPATIENT_CLINIC_OR_DEPARTMENT_OTHER): Payer: PPO | Admitting: Family

## 2017-10-10 ENCOUNTER — Inpatient Hospital Stay: Payer: PPO | Attending: Hematology & Oncology

## 2017-10-10 ENCOUNTER — Inpatient Hospital Stay: Payer: PPO

## 2017-10-10 ENCOUNTER — Encounter: Payer: Self-pay | Admitting: Family

## 2017-10-10 ENCOUNTER — Other Ambulatory Visit: Payer: Self-pay

## 2017-10-10 VITALS — BP 155/84 | HR 100 | Temp 98.3°F | Resp 19 | Wt 266.0 lb

## 2017-10-10 DIAGNOSIS — D508 Other iron deficiency anemias: Secondary | ICD-10-CM

## 2017-10-10 DIAGNOSIS — D509 Iron deficiency anemia, unspecified: Secondary | ICD-10-CM

## 2017-10-10 DIAGNOSIS — C679 Malignant neoplasm of bladder, unspecified: Secondary | ICD-10-CM

## 2017-10-10 DIAGNOSIS — Z79899 Other long term (current) drug therapy: Secondary | ICD-10-CM

## 2017-10-10 DIAGNOSIS — C772 Secondary and unspecified malignant neoplasm of intra-abdominal lymph nodes: Secondary | ICD-10-CM

## 2017-10-10 DIAGNOSIS — Z5111 Encounter for antineoplastic chemotherapy: Secondary | ICD-10-CM | POA: Diagnosis not present

## 2017-10-10 DIAGNOSIS — D500 Iron deficiency anemia secondary to blood loss (chronic): Secondary | ICD-10-CM

## 2017-10-10 LAB — IRON AND TIBC
Iron: 63 ug/dL (ref 42–163)
Saturation Ratios: 23 % — ABNORMAL LOW (ref 42–163)
TIBC: 278 ug/dL (ref 202–409)
UIBC: 214 ug/dL

## 2017-10-10 LAB — CMP (CANCER CENTER ONLY)
ALT: 25 U/L (ref 10–47)
AST: 32 U/L (ref 11–38)
Albumin: 3.4 g/dL — ABNORMAL LOW (ref 3.5–5.0)
Alkaline Phosphatase: 56 U/L (ref 26–84)
Anion gap: 13 (ref 5–15)
BUN: 22 mg/dL (ref 7–22)
CO2: 27 mmol/L (ref 18–33)
Calcium: 9.5 mg/dL (ref 8.0–10.3)
Chloride: 102 mmol/L (ref 98–108)
Creatinine: 0.9 mg/dL (ref 0.60–1.20)
Glucose, Bld: 204 mg/dL — ABNORMAL HIGH (ref 73–118)
Potassium: 4 mmol/L (ref 3.3–4.7)
Sodium: 142 mmol/L (ref 128–145)
Total Bilirubin: 0.7 mg/dL (ref 0.2–1.6)
Total Protein: 6.3 g/dL — ABNORMAL LOW (ref 6.4–8.1)

## 2017-10-10 LAB — CBC WITH DIFFERENTIAL (CANCER CENTER ONLY)
Basophils Absolute: 0 10*3/uL (ref 0.0–0.1)
Basophils Relative: 0 %
Eosinophils Absolute: 0 10*3/uL (ref 0.0–0.5)
Eosinophils Relative: 0 %
HCT: 31.3 % — ABNORMAL LOW (ref 38.7–49.9)
Hemoglobin: 9.9 g/dL — ABNORMAL LOW (ref 13.0–17.1)
Lymphocytes Relative: 10 %
Lymphs Abs: 1.3 10*3/uL (ref 0.9–3.3)
MCH: 27.9 pg — ABNORMAL LOW (ref 28.0–33.4)
MCHC: 31.6 g/dL — ABNORMAL LOW (ref 32.0–35.9)
MCV: 88.2 fL (ref 82.0–98.0)
Monocytes Absolute: 0.3 10*3/uL (ref 0.1–0.9)
Monocytes Relative: 2 %
Neutro Abs: 11.6 10*3/uL — ABNORMAL HIGH (ref 1.5–6.5)
Neutrophils Relative %: 88 %
Platelet Count: 215 10*3/uL (ref 145–400)
RBC: 3.55 MIL/uL — ABNORMAL LOW (ref 4.20–5.70)
RDW: 16.2 % — ABNORMAL HIGH (ref 11.1–15.7)
WBC Count: 13.2 10*3/uL — ABNORMAL HIGH (ref 4.0–10.0)

## 2017-10-10 LAB — LACTATE DEHYDROGENASE: LDH: 317 U/L — ABNORMAL HIGH (ref 98–192)

## 2017-10-10 LAB — FERRITIN: Ferritin: 798 ng/mL — ABNORMAL HIGH (ref 24–336)

## 2017-10-10 MED ORDER — DEXAMETHASONE SODIUM PHOSPHATE 10 MG/ML IJ SOLN
INTRAMUSCULAR | Status: AC
  Filled 2017-10-10: qty 1

## 2017-10-10 MED ORDER — SODIUM CHLORIDE 0.9 % IV SOLN
80.0000 mg/m2 | Freq: Once | INTRAVENOUS | Status: AC
  Administered 2017-10-10: 210 mg via INTRAVENOUS
  Filled 2017-10-10: qty 21

## 2017-10-10 MED ORDER — SODIUM CHLORIDE 0.9% FLUSH
10.0000 mL | INTRAVENOUS | Status: DC | PRN
  Administered 2017-10-10: 10 mL
  Filled 2017-10-10: qty 10

## 2017-10-10 MED ORDER — SODIUM CHLORIDE 0.9 % IV SOLN
Freq: Once | INTRAVENOUS | Status: AC
  Administered 2017-10-10: 10:00:00 via INTRAVENOUS

## 2017-10-10 MED ORDER — HEPARIN SOD (PORK) LOCK FLUSH 100 UNIT/ML IV SOLN
500.0000 [IU] | Freq: Once | INTRAVENOUS | Status: AC | PRN
  Administered 2017-10-10: 500 [IU]
  Filled 2017-10-10: qty 5

## 2017-10-10 MED ORDER — DEXAMETHASONE SODIUM PHOSPHATE 10 MG/ML IJ SOLN
10.0000 mg | Freq: Once | INTRAMUSCULAR | Status: AC
  Administered 2017-10-10: 10 mg via INTRAVENOUS

## 2017-10-10 NOTE — Patient Instructions (Signed)
Implanted Port Home Guide An implanted port is a type of central line that is placed under the skin. Central lines are used to provide IV access when treatment or nutrition needs to be given through a person's veins. Implanted ports are used for long-term IV access. An implanted port may be placed because:  You need IV medicine that would be irritating to the small veins in your hands or arms.  You need long-term IV medicines, such as antibiotics.  You need IV nutrition for a long period.  You need frequent blood draws for lab tests.  You need dialysis.  Implanted ports are usually placed in the chest area, but they can also be placed in the upper arm, the abdomen, or the leg. An implanted port has two main parts:  Reservoir. The reservoir is round and will appear as a small, raised area under your skin. The reservoir is the part where a needle is inserted to give medicines or draw blood.  Catheter. The catheter is a thin, flexible tube that extends from the reservoir. The catheter is placed into a large vein. Medicine that is inserted into the reservoir goes into the catheter and then into the vein.  How will I care for my incision site? Do not get the incision site wet. Bathe or shower as directed by your health care provider. How is my port accessed? Special steps must be taken to access the port:  Before the port is accessed, a numbing cream can be placed on the skin. This helps numb the skin over the port site.  Your health care provider uses a sterile technique to access the port. ? Your health care provider must put on a mask and sterile gloves. ? The skin over your port is cleaned carefully with an antiseptic and allowed to dry. ? The port is gently pinched between sterile gloves, and a needle is inserted into the port.  Only "non-coring" port needles should be used to access the port. Once the port is accessed, a blood return should be checked. This helps ensure that the port  is in the vein and is not clogged.  If your port needs to remain accessed for a constant infusion, a clear (transparent) bandage will be placed over the needle site. The bandage and needle will need to be changed every week, or as directed by your health care provider.  Keep the bandage covering the needle clean and dry. Do not get it wet. Follow your health care provider's instructions on how to take a shower or bath while the port is accessed.  If your port does not need to stay accessed, no bandage is needed over the port.  What is flushing? Flushing helps keep the port from getting clogged. Follow your health care provider's instructions on how and when to flush the port. Ports are usually flushed with saline solution or a medicine called heparin. The need for flushing will depend on how the port is used.  If the port is used for intermittent medicines or blood draws, the port will need to be flushed: ? After medicines have been given. ? After blood has been drawn. ? As part of routine maintenance.  If a constant infusion is running, the port may not need to be flushed.  How long will my port stay implanted? The port can stay in for as long as your health care provider thinks it is needed. When it is time for the port to come out, surgery will be   done to remove it. The procedure is similar to the one performed when the port was put in. When should I seek immediate medical care? When you have an implanted port, you should seek immediate medical care if:  You notice a bad smell coming from the incision site.  You have swelling, redness, or drainage at the incision site.  You have more swelling or pain at the port site or the surrounding area.  You have a fever that is not controlled with medicine.  This information is not intended to replace advice given to you by your health care provider. Make sure you discuss any questions you have with your health care provider. Document  Released: 03/29/2005 Document Revised: 09/04/2015 Document Reviewed: 12/04/2012 Elsevier Interactive Patient Education  2017 [employer]  

## 2017-10-10 NOTE — Patient Instructions (Signed)
[facility] Discharge Instructions for Patients Receiving Chemotherapy  Today you received the following chemotherapy agents Taxotere To help prevent nausea and vomiting after your treatment, we encourage you to take your nausea medication as prescribed.   If you develop nausea and vomiting that is not controlled by your nausea medication, call the clinic.   BELOW ARE SYMPTOMS THAT SHOULD BE REPORTED IMMEDIATELY:  *FEVER GREATER THAN 100.5 F  *CHILLS WITH OR WITHOUT FEVER  NAUSEA AND VOMITING THAT IS NOT CONTROLLED WITH YOUR NAUSEA MEDICATION  *UNUSUAL SHORTNESS OF BREATH  *UNUSUAL BRUISING OR BLEEDING  TENDERNESS IN MOUTH AND THROAT WITH OR WITHOUT PRESENCE OF ULCERS  *URINARY PROBLEMS  *BOWEL PROBLEMS  UNUSUAL RASH Items with * indicate a potential emergency and should be followed up as soon as possible.  Feel free to call the clinic should you have any questions or concerns. The clinic phone number is [phone].  Please show the [facility] at check-in to the Emergency Department and triage nurse.

## 2017-10-10 NOTE — Progress Notes (Signed)
Hematology and Oncology Follow Up Visit  [name] Rose [id number] [birth year] 67 y.o. 10/10/2017   Principle Diagnosis:  Metastatic high-grade bladder cancer -- recurrent  Past Therapy: Atezolizumab 1200mg  IV q 3 wks - s/p cycle #4 - d/c due to progression.  Current Therapy:   Taxotere 80mg /m2 IV q 3 wks - s/p cycle 3   Interim History:  [name] is here today for follow-up and cycle 4 of treatment. He is doing well and tolerating treatment nicely. The tenderness in the back of his tongue and mouth has resolved.  His SOB has improved since a got IV iron last month. He is back out golfing and staying busy in his yard. He has been able to mow his lawn and just takes breaks as needed.  No fever, chills, n/v, cough, rash, dizziness, SOB, chest pain, palpitations, abdominal pain or changes in bowel or bladder habits.  No episodes of bleeding, no bruising or petechiae. No lymphadenopathy noted on his exam.  The tingling in his fingertips and ankles to toes is unchanged. This has no effected his dexterity or gait so far. No falls or syncopal episodes.  No swelling or tenderness in his extremities. Her has maintained a good appetite and is staying well hydrated. His weight is stable.   ECOG Performance Status: 1 - Symptomatic but completely ambulatory  Medications:  Allergies as of 10/10/2017   No Known Allergies     Medication List        Accurate as of 10/10/17  1:30 PM. Always use your most recent med list.          ALEVE 220 MG Caps Generic drug:  Naproxen Sodium Take 220 mg by mouth 2 (two) times daily.   Cinnamon 500 MG Tabs Take 1 tablet by mouth 2 (two) times daily.   ciprofloxacin 500 MG tablet Commonly known as:  CIPRO Take 1 tablet (500 mg total) by mouth 2 (two) times daily.   dexamethasone 4 MG tablet Commonly known as:  DECADRON Take 2 tablets (8 mg total) by mouth 2 (two) times daily. Start the day before Taxotere. Then daily after chemo for 2 days.     gabapentin 100 MG capsule Commonly known as:  NEURONTIN Take 3 capsules (300 mg total) by mouth 2 (two) times daily.   LORazepam 0.5 MG tablet Commonly known as:  ATIVAN Take 1 tablet (0.5 mg total) by mouth every 6 (six) hours as needed (Nausea or vomiting).   magic mouthwash w/lidocaine Soln Take 5 mLs by mouth 4 (four) times daily.   MAGNESIUM PO Take 400 mg by mouth at bedtime. Leg cramps   ondansetron 8 MG tablet Commonly known as:  ZOFRAN Take 1 tablet (8 mg total) by mouth 2 (two) times daily as needed for refractory nausea / vomiting.   prochlorperazine 10 MG tablet Commonly known as:  COMPAZINE Take 1 tablet (10 mg total) by mouth every 6 (six) hours as needed (Nausea or vomiting).   vitamin B-6 250 MG tablet Take 1 tablet (250 mg total) by mouth daily.       Allergies: No Known Allergies  Past Medical History, Surgical history, Social history, and Family History were reviewed and updated.  Review of Systems: All other 10 point review of systems is negative.   Physical Exam:  weight is 266 lb (120.7 kg). His oral temperature is 98.3 F (36.8 C). His blood pressure is 155/84 (abnormal) and his pulse is 100. His respiration is 19 and oxygen saturation is  98%.   Wt Readings from Last 3 Encounters:  10/10/17 266 lb (120.7 kg)  09/20/17 270 lb 1.9 oz (122.5 kg)  09/02/17 270 lb (122.5 kg)    Ocular: Sclerae unicteric, pupils equal, round and reactive to light Ear-nose-throat: Oropharynx clear, dentition fair Lymphatic: No cervical, supraclavicular or axillary adenopathy Lungs no rales or rhonchi, good excursion bilaterally Heart regular rate and rhythm, no murmur appreciated Abd soft, nontender, positive bowel sounds, no liver or spleen tip palpated on exam, no fluid wave  MSK no focal spinal tenderness, no joint edema Neuro: non-focal, well-oriented, appropriate affect Breasts: Deferred   Lab Results  Component Value Date   WBC 13.2 (H) 10/10/2017    HGB 9.9 (L) 10/10/2017   HCT 31.3 (L) 10/10/2017   MCV 88.2 10/10/2017   PLT 215 10/10/2017   Lab Results  Component Value Date   FERRITIN 798 (H) 10/10/2017   IRON 63 10/10/2017   TIBC 278 10/10/2017   UIBC 214 10/10/2017   IRONPCTSAT 23 (L) 10/10/2017   Lab Results  Component Value Date   RBC 3.55 (L) 10/10/2017   No results found for: KPAFRELGTCHN, LAMBDASER, KAPLAMBRATIO No results found for: IGGSERUM, IGA, IGMSERUM No results found for: [name], SPEI   Chemistry      Component Value Date/Time   NA 142 10/10/2017 0845   NA 141 02/21/2017 0902   K 4.0 10/10/2017 0845   K 4.1 02/21/2017 0902   CL 102 10/10/2017 0845   CL 98 02/21/2017 0902   CO2 27 10/10/2017 0845   CO2 31 02/21/2017 0902   BUN 22 10/10/2017 0845   BUN 17 02/21/2017 0902   CREATININE 0.90 10/10/2017 0845   CREATININE 1.2 02/21/2017 0902      Component Value Date/Time   CALCIUM 9.5 10/10/2017 0845   CALCIUM 9.2 02/21/2017 0902   ALKPHOS 56 10/10/2017 0845   ALKPHOS 65 02/21/2017 0902   AST 32 10/10/2017 0845   ALT 25 10/10/2017 0845   ALT 18 02/21/2017 0902   BILITOT 0.7 10/10/2017 0845      Impression and Plan: [name] is a very pleasant 67 yo caucasian gentleman with metastatic high grade bladder cancer. He tolerated 6 cycles of Cisplatin/Gemcitabine.  He is now doing well on Taxotere. We will proceed with cycle 4 today as planned.  Holding his Neulasta and giving him Cipro as prophylaxis worked well with his last cycle so we will do this again.  He will have his repeat PET scan in 2 weeks with follow-up and treatment in 3 weeks.  He will contact our office with any questions or concerns. We can certainly see him sooner if need be.   Laverna Peace, NP 7/1/20191:30 PM

## 2017-10-11 ENCOUNTER — Other Ambulatory Visit: Payer: Self-pay | Admitting: Family

## 2017-10-12 ENCOUNTER — Ambulatory Visit: Payer: PPO

## 2017-10-18 ENCOUNTER — Other Ambulatory Visit: Payer: Self-pay

## 2017-10-18 ENCOUNTER — Inpatient Hospital Stay: Payer: PPO

## 2017-10-18 VITALS — BP 114/63 | HR 92 | Temp 97.5°F | Resp 20

## 2017-10-18 DIAGNOSIS — D508 Other iron deficiency anemias: Secondary | ICD-10-CM

## 2017-10-18 DIAGNOSIS — Z5111 Encounter for antineoplastic chemotherapy: Secondary | ICD-10-CM | POA: Diagnosis not present

## 2017-10-18 MED ORDER — SODIUM CHLORIDE 0.9 % IV SOLN
510.0000 mg | Freq: Once | INTRAVENOUS | Status: AC
  Administered 2017-10-18: 510 mg via INTRAVENOUS
  Filled 2017-10-18: qty 17

## 2017-10-18 MED ORDER — SODIUM CHLORIDE 0.9% FLUSH
3.0000 mL | Freq: Once | INTRAVENOUS | Status: AC | PRN
  Administered 2017-10-18: 10 mL via INTRAVENOUS
  Filled 2017-10-18: qty 10

## 2017-10-18 MED ORDER — HEPARIN SOD (PORK) LOCK FLUSH 100 UNIT/ML IV SOLN
500.0000 [IU] | Freq: Once | INTRAVENOUS | Status: AC | PRN
  Administered 2017-10-18: 500 [IU]
  Filled 2017-10-18: qty 5

## 2017-10-18 MED ORDER — SODIUM CHLORIDE 0.9 % IV SOLN
Freq: Once | INTRAVENOUS | Status: AC
  Administered 2017-10-18: 09:00:00 via INTRAVENOUS

## 2017-10-18 NOTE — Patient Instructions (Signed)

## 2017-10-27 ENCOUNTER — Ambulatory Visit (HOSPITAL_COMMUNITY)
Admission: RE | Admit: 2017-10-27 | Discharge: 2017-10-27 | Disposition: A | Discharge status: Home / Self Care | Payer: PPO | Source: Ambulatory Visit | Attending: Family | Admitting: Family

## 2017-10-27 DIAGNOSIS — C772 Secondary and unspecified malignant neoplasm of intra-abdominal lymph nodes: Secondary | ICD-10-CM

## 2017-10-27 DIAGNOSIS — C778 Secondary and unspecified malignant neoplasm of lymph nodes of multiple regions: Secondary | ICD-10-CM | POA: Insufficient documentation

## 2017-10-27 DIAGNOSIS — I700 Atherosclerosis of aorta: Secondary | ICD-10-CM | POA: Diagnosis not present

## 2017-10-27 DIAGNOSIS — I2510 Atherosclerotic heart disease of native coronary artery without angina pectoris: Secondary | ICD-10-CM | POA: Insufficient documentation

## 2017-10-27 DIAGNOSIS — C679 Malignant neoplasm of bladder, unspecified: Secondary | ICD-10-CM | POA: Insufficient documentation

## 2017-10-27 LAB — GLUCOSE, CAPILLARY: Glucose-Capillary: 116 mg/dL — ABNORMAL HIGH (ref 70–99)

## 2017-10-27 MED ORDER — FLUDEOXYGLUCOSE F - 18 (FDG) INJECTION
13.3000 | Freq: Once | INTRAVENOUS | Status: AC | PRN
  Administered 2017-10-27: 13.3 via INTRAVENOUS

## 2017-10-28 ENCOUNTER — Telehealth: Payer: Self-pay | Admitting: *Deleted

## 2017-10-28 NOTE — Telephone Encounter (Signed)
Message received from patient to obtain PET scan results from yesterday.  [name] notified and will contact patient with results.

## 2017-10-29 ENCOUNTER — Other Ambulatory Visit: Payer: Self-pay | Admitting: Hematology & Oncology

## 2017-10-29 ENCOUNTER — Telehealth: Payer: Self-pay | Admitting: Hematology & Oncology

## 2017-10-29 DIAGNOSIS — C772 Secondary and unspecified malignant neoplasm of intra-abdominal lymph nodes: Secondary | ICD-10-CM

## 2017-10-29 DIAGNOSIS — C679 Malignant neoplasm of bladder, unspecified: Secondary | ICD-10-CM

## 2017-10-29 NOTE — Telephone Encounter (Signed)
I left a phone message for [name] Rose.  I called him on a Saturday morning.  I figured he probably would be home watching the [place] Open.  However, he was not home.  I left the message that the PET scan did not show any new areas of disease but the lymph nodes that were malignant or more active and slightly larger.  Because of this, we have to make a change in his protocol.  We will not continue the Taxotere.  I think that the next step would be utilizing dose reduced M-VAC.  I think this would be reasonable and tolerated.  We will have to get an echocardiogram on him.  I told him in the message that we will cancel his appointment on Tuesday the 23rd.  I will reschedule for Tuesday the 30th.  I will see him then.  Told him that I would be out of the office on Monday and Tuesday of next week.  If he wants to call I will be more than happy to talk to him on Wednesday when I return from my wedding that I attended.  [name], MD

## 2017-10-31 ENCOUNTER — Other Ambulatory Visit: Payer: PPO

## 2017-10-31 ENCOUNTER — Ambulatory Visit: Payer: PPO

## 2017-10-31 ENCOUNTER — Ambulatory Visit: Payer: PPO | Admitting: Family

## 2017-10-31 ENCOUNTER — Other Ambulatory Visit: Payer: Self-pay | Admitting: *Deleted

## 2017-10-31 NOTE — Telephone Encounter (Signed)
Incorrect encounter

## 2017-11-01 ENCOUNTER — Other Ambulatory Visit: Payer: PPO

## 2017-11-01 ENCOUNTER — Ambulatory Visit: Payer: PPO

## 2017-11-01 ENCOUNTER — Ambulatory Visit: Payer: PPO | Admitting: Family

## 2017-11-03 ENCOUNTER — Ambulatory Visit (HOSPITAL_COMMUNITY)
Admission: RE | Admit: 2017-11-03 | Discharge: 2017-11-03 | Disposition: A | Discharge status: Home / Self Care | Payer: PPO | Source: Ambulatory Visit | Attending: Hematology & Oncology | Admitting: Hematology & Oncology

## 2017-11-03 DIAGNOSIS — C679 Malignant neoplasm of bladder, unspecified: Secondary | ICD-10-CM | POA: Diagnosis not present

## 2017-11-03 DIAGNOSIS — I10 Essential (primary) hypertension: Secondary | ICD-10-CM | POA: Insufficient documentation

## 2017-11-03 DIAGNOSIS — C772 Secondary and unspecified malignant neoplasm of intra-abdominal lymph nodes: Secondary | ICD-10-CM | POA: Insufficient documentation

## 2017-11-03 NOTE — Progress Notes (Signed)
  Echocardiogram 2D Echocardiogram has been performed.  Tyrone Balash G Mackenze Grandison 11/03/2017, 11:19 AM

## 2017-11-04 ENCOUNTER — Telehealth: Payer: Self-pay | Admitting: *Deleted

## 2017-11-04 NOTE — Telephone Encounter (Addendum)
Patient's wife is aware of results  ----- Message from Volanda Napoleon, MD sent at 11/03/2017  3:16 PM EDT ----- Call - your heart is pumpmg well!!!

## 2017-11-09 ENCOUNTER — Ambulatory Visit: Payer: PPO | Admitting: Hematology & Oncology

## 2017-11-09 ENCOUNTER — Other Ambulatory Visit: Payer: PPO

## 2017-11-09 ENCOUNTER — Ambulatory Visit: Payer: PPO

## 2017-11-10 ENCOUNTER — Inpatient Hospital Stay (HOSPITAL_BASED_OUTPATIENT_CLINIC_OR_DEPARTMENT_OTHER): Payer: PPO | Admitting: Hematology & Oncology

## 2017-11-10 ENCOUNTER — Inpatient Hospital Stay: Payer: PPO

## 2017-11-10 ENCOUNTER — Other Ambulatory Visit: Payer: Self-pay

## 2017-11-10 ENCOUNTER — Other Ambulatory Visit: Payer: Self-pay | Admitting: *Deleted

## 2017-11-10 ENCOUNTER — Ambulatory Visit: Payer: PPO

## 2017-11-10 ENCOUNTER — Inpatient Hospital Stay: Payer: PPO | Attending: Hematology & Oncology

## 2017-11-10 VITALS — BP 127/80 | HR 76 | Temp 98.4°F | Resp 20 | Wt 265.2 lb

## 2017-11-10 DIAGNOSIS — C679 Malignant neoplasm of bladder, unspecified: Secondary | ICD-10-CM

## 2017-11-10 DIAGNOSIS — C772 Secondary and unspecified malignant neoplasm of intra-abdominal lymph nodes: Secondary | ICD-10-CM

## 2017-11-10 DIAGNOSIS — Z79899 Other long term (current) drug therapy: Secondary | ICD-10-CM | POA: Diagnosis not present

## 2017-11-10 DIAGNOSIS — K5900 Constipation, unspecified: Secondary | ICD-10-CM | POA: Diagnosis not present

## 2017-11-10 DIAGNOSIS — D508 Other iron deficiency anemias: Secondary | ICD-10-CM

## 2017-11-10 DIAGNOSIS — Z5111 Encounter for antineoplastic chemotherapy: Secondary | ICD-10-CM

## 2017-11-10 LAB — IRON AND TIBC
Iron: 51 ug/dL (ref 42–163)
Saturation Ratios: 19 % — ABNORMAL LOW (ref 42–163)
TIBC: 273 ug/dL (ref 202–409)
UIBC: 222 ug/dL

## 2017-11-10 LAB — CBC WITH DIFFERENTIAL (CANCER CENTER ONLY)
Basophils Absolute: 0 10*3/uL (ref 0.0–0.1)
Basophils Relative: 1 %
Eosinophils Absolute: 0.4 10*3/uL (ref 0.0–0.5)
Eosinophils Relative: 7 %
HCT: 36.3 % — ABNORMAL LOW (ref 38.7–49.9)
Hemoglobin: 11.2 g/dL — ABNORMAL LOW (ref 13.0–17.1)
Lymphocytes Relative: 28 %
Lymphs Abs: 1.7 10*3/uL (ref 0.9–3.3)
MCH: 28.1 pg (ref 28.0–33.4)
MCHC: 30.9 g/dL — ABNORMAL LOW (ref 32.0–35.9)
MCV: 91.2 fL (ref 82.0–98.0)
Monocytes Absolute: 0.8 10*3/uL (ref 0.1–0.9)
Monocytes Relative: 13 %
Neutro Abs: 3.1 10*3/uL (ref 1.5–6.5)
Neutrophils Relative %: 51 %
Platelet Count: 186 10*3/uL (ref 145–400)
RBC: 3.98 MIL/uL — ABNORMAL LOW (ref 4.20–5.70)
RDW: 18.1 % — ABNORMAL HIGH (ref 11.1–15.7)
WBC Count: 6 10*3/uL (ref 4.0–10.0)

## 2017-11-10 LAB — CMP (CANCER CENTER ONLY)
ALT: 21 U/L (ref 10–47)
AST: 35 U/L (ref 11–38)
Albumin: 3.3 g/dL — ABNORMAL LOW (ref 3.5–5.0)
Alkaline Phosphatase: 68 U/L (ref 26–84)
Anion gap: 9 (ref 5–15)
BUN: 19 mg/dL (ref 7–22)
CO2: 31 mmol/L (ref 18–33)
Calcium: 9.5 mg/dL (ref 8.0–10.3)
Chloride: 102 mmol/L (ref 98–108)
Creatinine: 1.2 mg/dL (ref 0.60–1.20)
Glucose, Bld: 159 mg/dL — ABNORMAL HIGH (ref 73–118)
Potassium: 3.7 mmol/L (ref 3.3–4.7)
Sodium: 142 mmol/L (ref 128–145)
Total Bilirubin: 0.7 mg/dL (ref 0.2–1.6)
Total Protein: 6.2 g/dL — ABNORMAL LOW (ref 6.4–8.1)

## 2017-11-10 LAB — LACTATE DEHYDROGENASE: LDH: 254 U/L — ABNORMAL HIGH (ref 98–192)

## 2017-11-10 LAB — FERRITIN: Ferritin: 673 ng/mL — ABNORMAL HIGH (ref 24–336)

## 2017-11-10 MED ORDER — LORAZEPAM 0.5 MG PO TABS: 0.5000 mg | ORAL_TABLET | Freq: Four times a day (QID) | ORAL | 0 refills | Status: DC | PRN

## 2017-11-10 MED ORDER — SODIUM CHLORIDE 0.9% FLUSH
10.0000 mL | INTRAVENOUS | Status: DC | PRN
  Administered 2017-11-10: 10 mL
  Filled 2017-11-10: qty 10

## 2017-11-10 MED ORDER — ONDANSETRON HCL 8 MG PO TABS: 8.0000 mg | ORAL_TABLET | Freq: Two times a day (BID) | ORAL | 1 refills | Status: DC | PRN

## 2017-11-10 MED ORDER — HEPARIN SOD (PORK) LOCK FLUSH 100 UNIT/ML IV SOLN
500.0000 [IU] | Freq: Once | INTRAVENOUS | Status: AC | PRN
  Administered 2017-11-10: 500 [IU]
  Filled 2017-11-10: qty 5

## 2017-11-10 MED ORDER — DEXAMETHASONE SODIUM PHOSPHATE 10 MG/ML IJ SOLN
10.0000 mg | Freq: Once | INTRAMUSCULAR | Status: AC
  Administered 2017-11-10: 10 mg via INTRAVENOUS

## 2017-11-10 MED ORDER — ONDANSETRON HCL 4 MG/2ML IJ SOLN
8.0000 mg | Freq: Once | INTRAMUSCULAR | Status: AC
  Administered 2017-11-10: 8 mg via INTRAVENOUS

## 2017-11-10 MED ORDER — METHOTREXATE SODIUM (PF) CHEMO INJECTION 1 GM/40ML
75.0000 mg | Freq: Once | INTRAMUSCULAR | Status: AC
  Administered 2017-11-10: 75 mg via INTRAVENOUS
  Filled 2017-11-10: qty 3

## 2017-11-10 MED ORDER — PROCHLORPERAZINE MALEATE 10 MG PO TABS: 10.0000 mg | ORAL_TABLET | Freq: Four times a day (QID) | ORAL | 1 refills | Status: DC | PRN

## 2017-11-10 MED ORDER — ONDANSETRON HCL 4 MG/2ML IJ SOLN
INTRAMUSCULAR | Status: AC
  Filled 2017-11-10: qty 4

## 2017-11-10 MED ORDER — DEXAMETHASONE 4 MG PO TABS: ORAL_TABLET | ORAL | 1 refills | Status: DC

## 2017-11-10 MED ORDER — DEXAMETHASONE SODIUM PHOSPHATE 10 MG/ML IJ SOLN
INTRAMUSCULAR | Status: AC
  Filled 2017-11-10: qty 1

## 2017-11-10 MED ORDER — LIDOCAINE-PRILOCAINE 2.5-2.5 % EX CREA: TOPICAL_CREAM | CUTANEOUS | 3 refills | Status: DC

## 2017-11-10 MED ORDER — SODIUM CHLORIDE 0.9 % IV SOLN
Freq: Once | INTRAVENOUS | Status: AC
  Administered 2017-11-10: 10:00:00 via INTRAVENOUS
  Filled 2017-11-10: qty 250

## 2017-11-10 NOTE — Progress Notes (Signed)
Hematology and Oncology Follow Up Visit  [name] Rose [id number] 06-26-1950 67 y.o. 11/10/2017   Principle Diagnosis:  Metastatic high-grade bladder cancer -- recurrent  Current Therapy:  Atezolizumab 1200mg  IV q 3 wks - s/p cycle #4 - d/c due to progression. Taxotere 80mg /m2 IV q 3 wks - s/p cycle #3 -- d/c due to progression M-VAC -- cycle #1 to start on 11/10/2017   Interim History:  [name] Rose is here today for follow-up.  Unfortunately, his last PET scan did seem to show some progression.  There is no evidence of new areas of disease.  The PET scan showed that his lymph nodes were more active.  Because this, I really thought that this was indicative of a change in therapy.  I think our choice now is M-VAC.  He has not had this.  I think this would be reasonable.  I will have to make some dosage adjustments so that he could tolerate it.  I talked to he and his wife about this.  I spent about 35 minutes with them.  I explained the side effects.  He really has a Port-A-Cath in.  He had an echocardiogram done last week.  His ejection fraction was 55-60%.  This is fantastic.  He is eating better.  His taste for food has come back.  He and his wife are planning to go to [facility] the week of August 18.  We will make an adjustment with his treatment protocol so that he will be able to go.  He has not had bleeding.  There is been some constipation.  He has had no rashes.  There is been no leg swelling.  His overall performance status is ECOG 1.  Medications:  Allergies as of 11/10/2017   No Known Allergies     Medication List        Accurate as of 11/10/17  9:15 AM. Always use your most recent med list.          ALEVE 220 MG Caps Generic drug:  Naproxen Sodium Take 220 mg by mouth 2 (two) times daily.   amLODipine 10 MG tablet Commonly known as:  NORVASC Take 10 mg by mouth daily.   bisoprolol-hydrochlorothiazide 10-6.25 MG tablet Commonly known as:   ZIAC Take 1 tablet by mouth daily.   Cinnamon 500 MG Tabs Take 1 tablet by mouth 2 (two) times daily.   gabapentin 100 MG capsule Commonly known as:  NEURONTIN Take 3 capsules (300 mg total) by mouth 2 (two) times daily.   losartan 100 MG tablet Commonly known as:  COZAAR Take 100 mg by mouth daily.   magic mouthwash w/lidocaine Soln Take 5 mLs by mouth 4 (four) times daily.   MAGNESIUM PO Take 400 mg by mouth at bedtime. Leg cramps   vitamin B-6 250 MG tablet Take 1 tablet (250 mg total) by mouth daily.       Allergies: No Known Allergies  Past Medical History, Surgical history, Social history, and Family History were reviewed and updated.  Review of Systems: Review of Systems  Constitutional: Negative.   HENT: Negative.   Eyes: Negative.   Respiratory: Negative.   Cardiovascular: Negative.   Gastrointestinal: Negative.   Genitourinary: Negative.   Musculoskeletal: Negative.   Skin: Negative.   Neurological: Negative.   Endo/Heme/Allergies: Negative.   Psychiatric/Behavioral: Negative.      Physical Exam:  weight is 265 lb 4 oz (120.3 kg). His oral temperature is 98.4 F (36.9 C). His blood  pressure is 127/80 and his pulse is 76. His respiration is 20 and oxygen saturation is 100%.   Wt Readings from Last 3 Encounters:  11/10/17 265 lb 4 oz (120.3 kg)  10/10/17 266 lb (120.7 kg)  09/20/17 270 lb 1.9 oz (122.5 kg)    Physical Exam  Constitutional: He is oriented to person, place, and time.  HENT:  Head: Normocephalic and atraumatic.  Mouth/Throat: Oropharynx is clear and moist.  Eyes: Pupils are equal, round, and reactive to light. EOM are normal.  Neck: Normal range of motion.  Cardiovascular: Normal rate, regular rhythm and normal heart sounds.  Pulmonary/Chest: Effort normal and breath sounds normal.  Abdominal: Soft. Bowel sounds are normal.  Musculoskeletal: Normal range of motion. He exhibits no edema, tenderness or deformity.   Lymphadenopathy:    He has no cervical adenopathy.  Neurological: He is alert and oriented to person, place, and time.  Skin: Skin is warm and dry. No rash noted. No erythema.  Psychiatric: He has a normal mood and affect. His behavior is normal. Judgment and thought content normal.  Vitals reviewed.   Lab Results  Component Value Date   WBC 6.0 11/10/2017   HGB 11.2 (L) 11/10/2017   HCT 36.3 (L) 11/10/2017   MCV 91.2 11/10/2017   PLT 186 11/10/2017   Lab Results  Component Value Date   FERRITIN 798 (H) 10/10/2017   IRON 63 10/10/2017   TIBC 278 10/10/2017   UIBC 214 10/10/2017   IRONPCTSAT 23 (L) 10/10/2017   Lab Results  Component Value Date   RBC 3.98 (L) 11/10/2017   No results found for: KPAFRELGTCHN, LAMBDASER, KAPLAMBRATIO No results found for: IGGSERUM, IGA, IGMSERUM No results found for: [name], SPEI   Chemistry      Component Value Date/Time   NA 142 11/10/2017 0830   NA 141 02/21/2017 0902   K 3.7 11/10/2017 0830   K 4.1 02/21/2017 0902   CL 102 11/10/2017 0830   CL 98 02/21/2017 0902   CO2 31 11/10/2017 0830   CO2 31 02/21/2017 0902   BUN 19 11/10/2017 0830   BUN 17 02/21/2017 0902   CREATININE 1.20 11/10/2017 0830   CREATININE 1.2 02/21/2017 0902      Component Value Date/Time   CALCIUM 9.5 11/10/2017 0830   CALCIUM 9.2 02/21/2017 0902   ALKPHOS 68 11/10/2017 0830   ALKPHOS 65 02/21/2017 0902   AST 35 11/10/2017 0830   ALT 21 11/10/2017 0830   ALT 18 02/21/2017 0902   BILITOT 0.7 11/10/2017 0830      Impression and Plan: Mr. [name] is a very pleasant 67 yo caucasian gentleman with metastatic high grade bladder cancer.   We will go ahead with M-VAC therapy.  I do think this would be reasonable for him.  I will give him 2 cycles of treatment and then we will plan for another PET scan to see how he responds.  I will plan to see him back in 3-4 weeks.  We will give him a week off  [place].  Marland Kitchen  Volanda Napoleon, MD 8/1/20199:15 AM

## 2017-11-10 NOTE — Patient Instructions (Signed)
Implanted Port Insertion, Care After °This sheet gives you information about how to care for yourself after your procedure. Your health care provider may also give you more specific instructions. If you have problems or questions, contact your health care provider. °What can I expect after the procedure? °After your procedure, it is common to have: °· Discomfort at the port insertion site. °· Bruising on the skin over the port. This should improve over 3-4 days. ° °Follow these instructions at home: °Port care °· After your port is placed, you will get a manufacturer's information card. The card has information about your port. Keep this card with you at all times. °· Take care of the port as told by your health care provider. Ask your health care provider if you or a family member can get training for taking care of the port at home. A home health care nurse may also take care of the port. °· Make sure to remember what type of port you have. °Incision care °· Follow instructions from your health care provider about how to take care of your port insertion site. Make sure you: °? Wash your hands with soap and water before you change your bandage (dressing). If soap and water are not available, use hand sanitizer. °? Change your dressing as told by your health care provider. °? Leave stitches (sutures), skin glue, or adhesive strips in place. These skin closures may need to stay in place for 2 weeks or longer. If adhesive strip edges start to loosen and curl up, you may trim the loose edges. Do not remove adhesive strips completely unless your health care provider tells you to do that. °· Check your port insertion site every day for signs of infection. Check for: °? More redness, swelling, or pain. °? More fluid or blood. °? Warmth. °? Pus or a bad smell. °General instructions °· Do not take baths, swim, or use a hot tub until your health care provider approves. °· Do not lift anything that is heavier than 10 lb (4.5  kg) for a week, or as told by your health care provider. °· Ask your health care provider when it is okay to: °? Return to work or school. °? Resume usual physical activities or sports. °· Do not drive for 24 hours if you were given a medicine to help you relax (sedative). °· Take over-the-counter and prescription medicines only as told by your health care provider. °· Wear a medical alert bracelet in case of an emergency. This will tell any health care providers that you have a port. °· Keep all follow-up visits as told by your health care provider. This is important. °Contact a health care provider if: °· You cannot flush your port with saline as directed, or you cannot draw blood from the port. °· You have a fever or chills. °· You have more redness, swelling, or pain around your port insertion site. °· You have more fluid or blood coming from your port insertion site. °· Your port insertion site feels warm to the touch. °· You have pus or a bad smell coming from the port insertion site. °Get help right away if: °· You have chest pain or shortness of breath. °· You have bleeding from your port that you cannot control. °Summary °· Take care of the port as told by your health care provider. °· Change your dressing as told by your health care provider. °· Keep all follow-up visits as told by your health care provider. °  This information is not intended to replace advice given to you by your health care provider. Make sure you discuss any questions you have with your health care provider. °Document Released: 01/17/2013 Document Revised: 02/18/2016 Document Reviewed: 02/18/2016 °[employer] Interactive Patient Education © 2017 [employer] ° °

## 2017-11-10 NOTE — Patient Instructions (Signed)
[facility] Discharge Instructions for Patients Receiving Chemotherapy  Today you received the following chemotherapy agents:  Methotrexate  To help prevent nausea and vomiting after your treatment, we encourage you to take your nausea medication as ordered per MD.   If you develop nausea and vomiting that is not controlled by your nausea medication, call the clinic.   BELOW ARE SYMPTOMS THAT SHOULD BE REPORTED IMMEDIATELY:  *FEVER GREATER THAN 100.5 F  *CHILLS WITH OR WITHOUT FEVER  NAUSEA AND VOMITING THAT IS NOT CONTROLLED WITH YOUR NAUSEA MEDICATION  *UNUSUAL SHORTNESS OF BREATH  *UNUSUAL BRUISING OR BLEEDING  TENDERNESS IN MOUTH AND THROAT WITH OR WITHOUT PRESENCE OF ULCERS  *URINARY PROBLEMS  *BOWEL PROBLEMS  UNUSUAL RASH Items with * indicate a potential emergency and should be followed up as soon as possible.  Feel free to call the clinic should you have any questions or concerns. The clinic phone number is [phone].  Please show the [facility] at check-in to the Emergency Department and triage nurse.

## 2017-11-11 ENCOUNTER — Inpatient Hospital Stay: Payer: PPO

## 2017-11-11 ENCOUNTER — Other Ambulatory Visit: Payer: Self-pay

## 2017-11-11 VITALS — BP 113/92 | HR 80 | Temp 98.4°F | Resp 20

## 2017-11-11 DIAGNOSIS — C679 Malignant neoplasm of bladder, unspecified: Secondary | ICD-10-CM

## 2017-11-11 DIAGNOSIS — Z5111 Encounter for antineoplastic chemotherapy: Secondary | ICD-10-CM | POA: Diagnosis not present

## 2017-11-11 DIAGNOSIS — D508 Other iron deficiency anemias: Secondary | ICD-10-CM

## 2017-11-11 DIAGNOSIS — C772 Secondary and unspecified malignant neoplasm of intra-abdominal lymph nodes: Secondary | ICD-10-CM

## 2017-11-11 MED ORDER — VINBLASTINE SULFATE CHEMO INJECTION 1 MG/ML
2.0000 mg/m2 | Freq: Once | INTRAVENOUS | Status: AC
  Administered 2017-11-11: 5 mg via INTRAVENOUS
  Filled 2017-11-11: qty 5

## 2017-11-11 MED ORDER — SODIUM CHLORIDE 0.9% FLUSH
10.0000 mL | INTRAVENOUS | Status: DC | PRN
  Administered 2017-11-11: 10 mL
  Filled 2017-11-11: qty 10

## 2017-11-11 MED ORDER — PALONOSETRON HCL INJECTION 0.25 MG/5ML
INTRAVENOUS | Status: AC
  Filled 2017-11-11: qty 5

## 2017-11-11 MED ORDER — HEPARIN SOD (PORK) LOCK FLUSH 100 UNIT/ML IV SOLN
500.0000 [IU] | Freq: Once | INTRAVENOUS | Status: AC | PRN
  Administered 2017-11-11: 500 [IU]
  Filled 2017-11-11: qty 5

## 2017-11-11 MED ORDER — SODIUM CHLORIDE 0.9 % IV SOLN
510.0000 mg | Freq: Once | INTRAVENOUS | Status: AC
  Administered 2017-11-11: 510 mg via INTRAVENOUS
  Filled 2017-11-11: qty 17

## 2017-11-11 MED ORDER — SODIUM CHLORIDE 0.9 % IV SOLN
Freq: Once | INTRAVENOUS | Status: AC
  Administered 2017-11-11: 09:00:00 via INTRAVENOUS
  Filled 2017-11-11: qty 250

## 2017-11-11 MED ORDER — DOXORUBICIN HCL CHEMO IV INJECTION 2 MG/ML
24.0000 mg/m2 | Freq: Once | INTRAVENOUS | Status: AC
  Administered 2017-11-11: 60 mg via INTRAVENOUS
  Filled 2017-11-11: qty 30

## 2017-11-11 MED ORDER — PEGFILGRASTIM 6 MG/0.6ML ~~LOC~~ PSKT
6.0000 mg | PREFILLED_SYRINGE | Freq: Once | SUBCUTANEOUS | Status: AC
  Administered 2017-11-11: 6 mg via SUBCUTANEOUS

## 2017-11-11 MED ORDER — POTASSIUM CHLORIDE 2 MEQ/ML IV SOLN
Freq: Once | INTRAVENOUS | Status: AC
  Administered 2017-11-11: 09:00:00 via INTRAVENOUS
  Filled 2017-11-11: qty 10

## 2017-11-11 MED ORDER — PEGFILGRASTIM 6 MG/0.6ML ~~LOC~~ PSKT
PREFILLED_SYRINGE | SUBCUTANEOUS | Status: AC
  Filled 2017-11-11: qty 0.6

## 2017-11-11 MED ORDER — SODIUM CHLORIDE 0.9 % IV SOLN
Freq: Once | INTRAVENOUS | Status: AC
  Administered 2017-11-11: 11:00:00 via INTRAVENOUS
  Filled 2017-11-11: qty 5

## 2017-11-11 MED ORDER — SODIUM CHLORIDE 0.9 % IV SOLN
56.0000 mg/m2 | Freq: Once | INTRAVENOUS | Status: AC
  Administered 2017-11-11: 141 mg via INTRAVENOUS
  Filled 2017-11-11: qty 100

## 2017-11-11 MED ORDER — PALONOSETRON HCL INJECTION 0.25 MG/5ML
0.2500 mg | Freq: Once | INTRAVENOUS | Status: AC
  Administered 2017-11-11: 0.25 mg via INTRAVENOUS

## 2017-11-11 NOTE — Patient Instructions (Addendum)
[place] Discharge Instructions for Patients Receiving Chemotherapy  Today you received the following chemotherapy agents:  Velban, Cisplatin, and Adriamycin.  To help prevent nausea and vomiting after your treatment, we encourage you to take your nausea medications as directed on bottles.   If you develop nausea and vomiting that is not controlled by your nausea medication, call the clinic.   BELOW ARE SYMPTOMS THAT SHOULD BE REPORTED IMMEDIATELY:  *FEVER GREATER THAN 100.5 F  *CHILLS WITH OR WITHOUT FEVER  NAUSEA AND VOMITING THAT IS NOT CONTROLLED WITH YOUR NAUSEA MEDICATION  *UNUSUAL SHORTNESS OF BREATH  *UNUSUAL BRUISING OR BLEEDING  TENDERNESS IN MOUTH AND THROAT WITH OR WITHOUT PRESENCE OF ULCERS  *URINARY PROBLEMS  *BOWEL PROBLEMS  UNUSUAL RASH Items with * indicate a potential emergency and should be followed up as soon as possible.  Feel free to call the clinic should you have any questions or concerns. The clinic phone number is [phone].  Please show the [facility] at check-in to the Emergency Department and triage nurse.  Ferumoxytol injection What is this medicine? FERUMOXYTOL is an iron complex. Iron is used to make healthy red blood cells, which carry oxygen and nutrients throughout the body. This medicine is used to treat iron deficiency anemia in people with chronic kidney disease. This medicine may be used for other purposes; ask your health care provider or pharmacist if you have questions. COMMON BRAND NAME(S): Feraheme What should I tell my health care provider before I take this medicine? They need to know if you have any of these conditions: -anemia not caused by low iron levels -high levels of iron in the blood -magnetic resonance imaging (MRI) test scheduled -an unusual or allergic reaction to iron, other medicines, foods, dyes, or preservatives -pregnant or trying to get pregnant -breast-feeding How should I use  this medicine? This medicine is for injection into a vein. It is given by a health care professional in a hospital or clinic setting. Talk to your pediatrician regarding the use of this medicine in children. Special care may be needed. Overdosage: If you think you have taken too much of this medicine contact a poison control center or emergency room at once. NOTE: This medicine is only for you. Do not share this medicine with others. What if I miss a dose? It is important not to miss your dose. Call your doctor or health care professional if you are unable to keep an appointment. What may interact with this medicine? This medicine may interact with the following medications: -other iron products This list may not describe all possible interactions. Give your health care provider a list of all the medicines, herbs, non-prescription drugs, or dietary supplements you use. Also tell them if you smoke, drink alcohol, or use illegal drugs. Some items may interact with your medicine. What should I watch for while using this medicine? Visit your doctor or healthcare professional regularly. Tell your doctor or healthcare professional if your symptoms do not start to get better or if they get worse. You may need blood work done while you are taking this medicine. You may need to follow a special diet. Talk to your doctor. Foods that contain iron include: whole grains/cereals, dried fruits, beans, or peas, leafy green vegetables, and organ meats (liver, kidney). What side effects may I notice from receiving this medicine? Side effects that you should report to your doctor or health care professional as soon as possible: -allergic reactions like skin rash, itching  or hives, swelling of the face, lips, or tongue -breathing problems -changes in blood pressure -feeling faint or lightheaded, falls -fever or chills -flushing, sweating, or hot feelings -swelling of the ankles or feet Side effects that usually  do not require medical attention (report to your doctor or health care professional if they continue or are bothersome): -diarrhea -headache -nausea, vomiting -stomach pain This list may not describe all possible side effects. Call your doctor for medical advice about side effects. You may report side effects to FDA at 1-800-FDA-1088. Where should I keep my medicine? This drug is given in a hospital or clinic and will not be stored at home. NOTE: This sheet is a summary. It may not cover all possible information. If you have questions about this medicine, talk to your doctor, pharmacist, or health care provider.  2018 Elsevier/Gold Standard (2015-05-01 12:41:49) Cisplatin injection What is this medicine? CISPLATIN (SIS pla tin) is a chemotherapy drug. It targets fast dividing cells, like cancer cells, and causes these cells to die. This medicine is used to treat many types of cancer like bladder, ovarian, and testicular cancers. This medicine may be used for other purposes; ask your health care provider or pharmacist if you have questions. COMMON BRAND NAME(S): Platinol, Platinol -AQ What should I tell my health care provider before I take this medicine? They need to know if you have any of these conditions: -blood disorders -hearing problems -kidney disease -recent or ongoing radiation therapy -an unusual or allergic reaction to cisplatin, carboplatin, other chemotherapy, other medicines, foods, dyes, or preservatives -pregnant or trying to get pregnant -breast-feeding How should I use this medicine? This drug is given as an infusion into a vein. It is administered in a hospital or clinic by a specially trained health care professional. Talk to your pediatrician regarding the use of this medicine in children. Special care may be needed. Overdosage: If you think you have taken too much of this medicine contact a poison control center or emergency room at once. NOTE: This medicine is only  for you. Do not share this medicine with others. What if I miss a dose? It is important not to miss a dose. Call your doctor or health care professional if you are unable to keep an appointment. What may interact with this medicine? -dofetilide -foscarnet -medicines for seizures -medicines to increase blood counts like filgrastim, pegfilgrastim, sargramostim -probenecid -pyridoxine used with altretamine -rituximab -some antibiotics like amikacin, gentamicin, neomycin, polymyxin B, streptomycin, tobramycin -sulfinpyrazone -vaccines -zalcitabine Talk to your doctor or health care professional before taking any of these medicines: -acetaminophen -aspirin -ibuprofen -ketoprofen -naproxen This list may not describe all possible interactions. Give your health care provider a list of all the medicines, herbs, non-prescription drugs, or dietary supplements you use. Also tell them if you smoke, drink alcohol, or use illegal drugs. Some items may interact with your medicine. What should I watch for while using this medicine? Your condition will be monitored carefully while you are receiving this medicine. You will need important blood work done while you are taking this medicine. This drug may make you feel generally unwell. This is not uncommon, as chemotherapy can affect healthy cells as well as cancer cells. Report any side effects. Continue your course of treatment even though you feel ill unless your doctor tells you to stop. In some cases, you may be given additional medicines to help with side effects. Follow all directions for their use. Call your doctor or health care professional for advice  if you get a fever, chills or sore throat, or other symptoms of a cold or flu. Do not treat yourself. This drug decreases your body's ability to fight infections. Try to avoid being around people who are sick. This medicine may increase your risk to bruise or bleed. Call your doctor or health care  professional if you notice any unusual bleeding. Be careful brushing and flossing your teeth or using a toothpick because you may get an infection or bleed more easily. If you have any dental work done, tell your dentist you are receiving this medicine. Avoid taking products that contain aspirin, acetaminophen, ibuprofen, naproxen, or ketoprofen unless instructed by your doctor. These medicines may hide a fever. Do not become pregnant while taking this medicine. Women should inform their doctor if they wish to become pregnant or think they might be pregnant. There is a potential for serious side effects to an unborn child. Talk to your health care professional or pharmacist for more information. Do not breast-feed an infant while taking this medicine. Drink fluids as directed while you are taking this medicine. This will help protect your kidneys. Call your doctor or health care professional if you get diarrhea. Do not treat yourself. What side effects may I notice from receiving this medicine? Side effects that you should report to your doctor or health care professional as soon as possible: -allergic reactions like skin rash, itching or hives, swelling of the face, lips, or tongue -signs of infection - fever or chills, cough, sore throat, pain or difficulty passing urine -signs of decreased platelets or bleeding - bruising, pinpoint red spots on the skin, black, tarry stools, nosebleeds -signs of decreased red blood cells - unusually weak or tired, fainting spells, lightheadedness -breathing problems -changes in hearing -gout pain -low blood counts - This drug may decrease the number of white blood cells, red blood cells and platelets. You may be at increased risk for infections and bleeding. -nausea and vomiting -pain, swelling, redness or irritation at the injection site -pain, tingling, numbness in the hands or feet -problems with balance, movement -trouble passing urine or change in the  amount of urine Side effects that usually do not require medical attention (report to your doctor or health care professional if they continue or are bothersome): -changes in vision -loss of appetite -metallic taste in the mouth or changes in taste This list may not describe all possible side effects. Call your doctor for medical advice about side effects. You may report side effects to FDA at 1-800-FDA-1088. Where should I keep my medicine? This drug is given in a hospital or clinic and will not be stored at home. NOTE: This sheet is a summary. It may not cover all possible information. If you have questions about this medicine, talk to your doctor, pharmacist, or health care provider.  2018 Elsevier/Gold Standard (2007-07-04 14:40:54) Vinblastine injection What is this medicine? VINBLASTINE (vin BLAS teen) is a chemotherapy drug. It slows the growth of cancer cells. This medicine is used to treat many types of cancer like breast cancer, testicular cancer, Hodgkin's disease, non-Hodgkin's lymphoma, and sarcoma. This medicine may be used for other purposes; ask your health care provider or pharmacist if you have questions. COMMON BRAND NAME(S): Velban What should I tell my health care provider before I take this medicine? They need to know if you have any of these conditions: -blood disorders -dental disease -gout -infection (especially a virus infection such as chickenpox, cold sores, or herpes) -liver disease -lung  disease -nervous system disease -recent or ongoing radiation therapy -an unusual or allergic reaction to vinblastine, other chemotherapy agents, other medicines, foods, dyes, or preservatives -pregnant or trying to get pregnant -breast-feeding How should I use this medicine? This drug is given as an infusion into a vein. It is administered in a hospital or clinic by a specially trained health care professional. If you have pain, swelling, burning or any unusual feeling  around the site of your injection, tell your health care professional right away. Talk to your pediatrician regarding the use of this medicine in children. While this drug may be prescribed for selected conditions, precautions do apply. Overdosage: If you think you have taken too much of this medicine contact a poison control center or emergency room at once. NOTE: This medicine is only for you. Do not share this medicine with others. What if I miss a dose? It is important not to miss your dose. Call your doctor or health care professional if you are unable to keep an appointment. What may interact with this medicine? Do not take this medicine with any of the following medications: -erythromycin -itraconazole -mibefradil -voriconazole This medicine may also interact with the following medications: -cyclosporine -fluconazole -ketoconazole -medicines for seizures like phenytoin -medicines to increase blood counts like filgrastim, pegfilgrastim, sargramostim -vaccines -verapamil Talk to your doctor or health care professional before taking any of these medicines: -acetaminophen -aspirin -ibuprofen -ketoprofen -naproxen This list may not describe all possible interactions. Give your health care provider a list of all the medicines, herbs, non-prescription drugs, or dietary supplements you use. Also tell them if you smoke, drink alcohol, or use illegal drugs. Some items may interact with your medicine. What should I watch for while using this medicine? Your condition will be monitored carefully while you are receiving this medicine. You will need important blood work done while you are taking this medicine. This drug may make you feel generally unwell. This is not uncommon, as chemotherapy can affect healthy cells as well as cancer cells. Report any side effects. Continue your course of treatment even though you feel ill unless your doctor tells you to stop. In some cases, you may be given  additional medicines to help with side effects. Follow all directions for their use. Call your doctor or health care professional for advice if you get a fever, chills or sore throat, or other symptoms of a cold or flu. Do not treat yourself. This drug decreases your body's ability to fight infections. Try to avoid being around people who are sick. This medicine may increase your risk to bruise or bleed. Call your doctor or health care professional if you notice any unusual bleeding. Be careful brushing and flossing your teeth or using a toothpick because you may get an infection or bleed more easily. If you have any dental work done, tell your dentist you are receiving this medicine. Avoid taking products that contain aspirin, acetaminophen, ibuprofen, naproxen, or ketoprofen unless instructed by your doctor. These medicines may hide a fever. Do not become pregnant while taking this medicine. Women should inform their doctor if they wish to become pregnant or think they might be pregnant. There is a potential for serious side effects to an unborn child. Talk to your health care professional or pharmacist for more information. Do not breast-feed an infant while taking this medicine. Men may have a lower sperm count while taking this medicine. Talk to your doctor if you plan to father a child. What side effects  may I notice from receiving this medicine? Side effects that you should report to your doctor or health care professional as soon as possible: -allergic reactions like skin rash, itching or hives, swelling of the face, lips, or tongue -low blood counts - This drug may decrease the number of white blood cells, red blood cells and platelets. You may be at increased risk for infections and bleeding. -signs of infection - fever or chills, cough, sore throat, pain or difficulty passing urine -signs of decreased platelets or bleeding - bruising, pinpoint red spots on the skin, black, tarry stools,  nosebleeds -signs of decreased red blood cells - unusually weak or tired, fainting spells, lightheadedness -breathing problems -changes in hearing -change in the amount of urine -chest pain -high blood pressure -mouth sores -nausea and vomiting -pain, swelling, redness or irritation at the injection site -pain, tingling, numbness in the hands or feet -problems with balance, dizziness -seizures Side effects that usually do not require medical attention (report to your doctor or health care professional if they continue or are bothersome): -constipation -hair loss -jaw pain -loss of appetite -sensitivity to light -stomach pain -tumor pain This list may not describe all possible side effects. Call your doctor for medical advice about side effects. You may report side effects to FDA at 1-800-FDA-1088. Where should I keep my medicine? This drug is given in a hospital or clinic and will not be stored at home. NOTE: This sheet is a summary. It may not cover all possible information. If you have questions about this medicine, talk to your doctor, pharmacist, or health care provider.  2018 Elsevier/Gold Standard (2007-12-25 17:15:59) Doxorubicin injection What is this medicine? DOXORUBICIN (dox oh ROO bi sin) is a chemotherapy drug. It is used to treat many kinds of cancer like leukemia, lymphoma, neuroblastoma, sarcoma, and Wilms' tumor. It is also used to treat bladder cancer, breast cancer, lung cancer, ovarian cancer, stomach cancer, and thyroid cancer. This medicine may be used for other purposes; ask your health care provider or pharmacist if you have questions. COMMON BRAND NAME(S): Adriamycin, Adriamycin PFS, Adriamycin RDF, Rubex What should I tell my health care provider before I take this medicine? They need to know if you have any of these conditions: -heart disease -history of low blood counts caused by a medicine -liver disease -recent or ongoing radiation therapy -an  unusual or allergic reaction to doxorubicin, other chemotherapy agents, other medicines, foods, dyes, or preservatives -pregnant or trying to get pregnant -breast-feeding How should I use this medicine? This drug is given as an infusion into a vein. It is administered in a hospital or clinic by a specially trained health care professional. If you have pain, swelling, burning or any unusual feeling around the site of your injection, tell your health care professional right away. Talk to your pediatrician regarding the use of this medicine in children. Special care may be needed. Overdosage: If you think you have taken too much of this medicine contact a poison control center or emergency room at once. NOTE: This medicine is only for you. Do not share this medicine with others. What if I miss a dose? It is important not to miss your dose. Call your doctor or health care professional if you are unable to keep an appointment. What may interact with this medicine? This medicine may interact with the following medications: -6-mercaptopurine -paclitaxel -phenytoin -St. Trayvion's Wort -trastuzumab -verapamil This list may not describe all possible interactions. Give your health care provider a list  of all the medicines, herbs, non-prescription drugs, or dietary supplements you use. Also tell them if you smoke, drink alcohol, or use illegal drugs. Some items may interact with your medicine. What should I watch for while using this medicine? This drug may make you feel generally unwell. This is not uncommon, as chemotherapy can affect healthy cells as well as cancer cells. Report any side effects. Continue your course of treatment even though you feel ill unless your doctor tells you to stop. There is a maximum amount of this medicine you should receive throughout your life. The amount depends on the medical condition being treated and your overall health. Your doctor will watch how much of this medicine you  receive in your lifetime. Tell your doctor if you have taken this medicine before. You may need blood work done while you are taking this medicine. Your urine may turn red for a few days after your dose. This is not blood. If your urine is dark or brown, call your doctor. In some cases, you may be given additional medicines to help with side effects. Follow all directions for their use. Call your doctor or health care professional for advice if you get a fever, chills or sore throat, or other symptoms of a cold or flu. Do not treat yourself. This drug decreases your body's ability to fight infections. Try to avoid being around people who are sick. This medicine may increase your risk to bruise or bleed. Call your doctor or health care professional if you notice any unusual bleeding. Talk to your doctor about your risk of cancer. You may be more at risk for certain types of cancers if you take this medicine. Do not become pregnant while taking this medicine or for 6 months after stopping it. Women should inform their doctor if they wish to become pregnant or think they might be pregnant. Men should not father a child while taking this medicine and for 6 months after stopping it. There is a potential for serious side effects to an unborn child. Talk to your health care professional or pharmacist for more information. Do not breast-feed an infant while taking this medicine. This medicine has caused ovarian failure in some women and reduced sperm counts in some men This medicine may interfere with the ability to have a child. Talk with your doctor or health care professional if you are concerned about your fertility. What side effects may I notice from receiving this medicine? Side effects that you should report to your doctor or health care professional as soon as possible: -allergic reactions like skin rash, itching or hives, swelling of the face, lips, or tongue -breathing problems -chest pain -fast or  irregular heartbeat -low blood counts - this medicine may decrease the number of white blood cells, red blood cells and platelets. You may be at increased risk for infections and bleeding. -pain, redness, or irritation at site where injected -signs of infection - fever or chills, cough, sore throat, pain or difficulty passing urine -signs of decreased platelets or bleeding - bruising, pinpoint red spots on the skin, black, tarry stools, blood in the urine -swelling of the ankles, feet, hands -tiredness -weakness Side effects that usually do not require medical attention (report to your doctor or health care professional if they continue or are bothersome): -diarrhea -hair loss -mouth sores -nail discoloration or damage -nausea -red colored urine -vomiting This list may not describe all possible side effects. Call your doctor for medical advice about side effects.  You may report side effects to FDA at 1-800-FDA-1088. Where should I keep my medicine? This drug is given in a hospital or clinic and will not be stored at home. NOTE: This sheet is a summary. It may not cover all possible information. If you have questions about this medicine, talk to your doctor, pharmacist, or health care provider.  2018 Elsevier/Gold Standard (2015-05-26 11:28:51) Pegfilgrastim injection What is this medicine? PEGFILGRASTIM (PEG fil gra stim) is a long-acting granulocyte colony-stimulating factor that stimulates the growth of neutrophils, a type of white blood cell important in the body's fight against infection. It is used to reduce the incidence of fever and infection in patients with certain types of cancer who are receiving chemotherapy that affects the bone marrow, and to increase survival after being exposed to high doses of radiation. This medicine may be used for other purposes; ask your health care provider or pharmacist if you have questions. COMMON BRAND NAME(S): Neulasta What should I tell my  health care provider before I take this medicine? They need to know if you have any of these conditions: -kidney disease -latex allergy -ongoing radiation therapy -sickle cell disease -skin reactions to acrylic adhesives (On-Body Injector only) -an unusual or allergic reaction to pegfilgrastim, filgrastim, other medicines, foods, dyes, or preservatives -pregnant or trying to get pregnant -breast-feeding How should I use this medicine? This medicine is for injection under the skin. If you get this medicine at home, you will be taught how to prepare and give the pre-filled syringe or how to use the On-body Injector. Refer to the patient Instructions for Use for detailed instructions. Use exactly as directed. Tell your healthcare provider immediately if you suspect that the On-body Injector may not have performed as intended or if you suspect the use of the On-body Injector resulted in a missed or partial dose. It is important that you put your used needles and syringes in a special sharps container. Do not put them in a trash can. If you do not have a sharps container, call your pharmacist or healthcare provider to get one. Talk to your pediatrician regarding the use of this medicine in children. While this drug may be prescribed for selected conditions, precautions do apply. Overdosage: If you think you have taken too much of this medicine contact a poison control center or emergency room at once. NOTE: This medicine is only for you. Do not share this medicine with others. What if I miss a dose? It is important not to miss your dose. Call your doctor or health care professional if you miss your dose. If you miss a dose due to an On-body Injector failure or leakage, a new dose should be administered as soon as possible using a single prefilled syringe for manual use. What may interact with this medicine? Interactions have not been studied. Give your health care provider a list of all the medicines,  herbs, non-prescription drugs, or dietary supplements you use. Also tell them if you smoke, drink alcohol, or use illegal drugs. Some items may interact with your medicine. This list may not describe all possible interactions. Give your health care provider a list of all the medicines, herbs, non-prescription drugs, or dietary supplements you use. Also tell them if you smoke, drink alcohol, or use illegal drugs. Some items may interact with your medicine. What should I watch for while using this medicine? You may need blood work done while you are taking this medicine. If you are going to need a MRI,  CT scan, or other procedure, tell your doctor that you are using this medicine (On-Body Injector only). What side effects may I notice from receiving this medicine? Side effects that you should report to your doctor or health care professional as soon as possible: -allergic reactions like skin rash, itching or hives, swelling of the face, lips, or tongue -dizziness -fever -pain, redness, or irritation at site where injected -pinpoint red spots on the skin -red or dark-brown urine -shortness of breath or breathing problems -stomach or side pain, or pain at the shoulder -swelling -tiredness -trouble passing urine or change in the amount of urine Side effects that usually do not require medical attention (report to your doctor or health care professional if they continue or are bothersome): -bone pain -muscle pain This list may not describe all possible side effects. Call your doctor for medical advice about side effects. You may report side effects to FDA at 1-800-FDA-1088. Where should I keep my medicine? Keep out of the reach of children. Store pre-filled syringes in a refrigerator between 2 and 8 degrees C (36 and 46 degrees F). Do not freeze. Keep in carton to protect from light. Throw away this medicine if it is left out of the refrigerator for more than 48 hours. Throw away any unused  medicine after the expiration date. NOTE: This sheet is a summary. It may not cover all possible information. If you have questions about this medicine, talk to your doctor, pharmacist, or health care provider.  2018 Elsevier/Gold Standard (2016-03-25 12:58:03)

## 2017-11-18 ENCOUNTER — Telehealth: Payer: Self-pay

## 2017-11-18 NOTE — Telephone Encounter (Signed)
Message left on personalized VM re: 1st MVAC treatment last week. Advised to call office for questions or concerns. dph

## 2017-11-21 ENCOUNTER — Ambulatory Visit: Payer: PPO | Admitting: Hematology & Oncology

## 2017-11-21 ENCOUNTER — Other Ambulatory Visit: Payer: PPO

## 2017-11-21 ENCOUNTER — Telehealth: Payer: Self-pay | Admitting: *Deleted

## 2017-11-21 ENCOUNTER — Ambulatory Visit: Payer: PPO

## 2017-11-21 NOTE — Telephone Encounter (Signed)
Patient c/o constant sinus drainage for about 3 weeks. He initially believed the symptoms related to allergies, but the symptoms have worsened. He describes a continues runny nose with some hoarseness and occasional cough. No fever. No pressure. He started taking Claritin yesterday.  Spoke with Dr Marin Olp. He would like patient to continue Claritin for a few more days, and if there is no improvement in symptoms, he is to call the office back. He understands. He states he will continue Claritin daily and call on Thursday morning if symptoms have not improved.

## 2017-11-28 ENCOUNTER — Ambulatory Visit: Payer: PPO

## 2017-11-28 ENCOUNTER — Other Ambulatory Visit: Payer: PPO

## 2017-11-28 ENCOUNTER — Ambulatory Visit: Payer: PPO | Admitting: Hematology & Oncology

## 2017-11-29 ENCOUNTER — Other Ambulatory Visit: Payer: PPO

## 2017-11-29 ENCOUNTER — Ambulatory Visit: Payer: PPO

## 2017-11-29 ENCOUNTER — Ambulatory Visit: Payer: PPO | Admitting: Hematology & Oncology

## 2017-11-30 ENCOUNTER — Ambulatory Visit: Payer: PPO

## 2017-12-05 ENCOUNTER — Inpatient Hospital Stay: Payer: PPO

## 2017-12-05 ENCOUNTER — Inpatient Hospital Stay (HOSPITAL_BASED_OUTPATIENT_CLINIC_OR_DEPARTMENT_OTHER): Payer: PPO | Admitting: Hematology & Oncology

## 2017-12-05 ENCOUNTER — Encounter: Payer: Self-pay | Admitting: Hematology & Oncology

## 2017-12-05 ENCOUNTER — Other Ambulatory Visit: Payer: Self-pay

## 2017-12-05 VITALS — BP 113/53 | HR 80 | Temp 97.7°F | Resp 20 | Wt 267.0 lb

## 2017-12-05 DIAGNOSIS — C679 Malignant neoplasm of bladder, unspecified: Secondary | ICD-10-CM

## 2017-12-05 DIAGNOSIS — C772 Secondary and unspecified malignant neoplasm of intra-abdominal lymph nodes: Secondary | ICD-10-CM | POA: Diagnosis not present

## 2017-12-05 DIAGNOSIS — Z5111 Encounter for antineoplastic chemotherapy: Secondary | ICD-10-CM

## 2017-12-05 DIAGNOSIS — Z79899 Other long term (current) drug therapy: Secondary | ICD-10-CM

## 2017-12-05 DIAGNOSIS — K5900 Constipation, unspecified: Secondary | ICD-10-CM | POA: Diagnosis not present

## 2017-12-05 LAB — CBC WITH DIFFERENTIAL (CANCER CENTER ONLY)
Basophils Absolute: 0.1 10*3/uL (ref 0.0–0.1)
Basophils Relative: 1 %
Eosinophils Absolute: 0 10*3/uL (ref 0.0–0.5)
Eosinophils Relative: 1 %
HCT: 30.3 % — ABNORMAL LOW (ref 38.7–49.9)
Hemoglobin: 9.5 g/dL — ABNORMAL LOW (ref 13.0–17.1)
Lymphocytes Relative: 26 %
Lymphs Abs: 2 10*3/uL (ref 0.9–3.3)
MCH: 29 pg (ref 28.0–33.4)
MCHC: 31.4 g/dL — ABNORMAL LOW (ref 32.0–35.9)
MCV: 92.4 fL (ref 82.0–98.0)
Monocytes Absolute: 0.8 10*3/uL (ref 0.1–0.9)
Monocytes Relative: 10 %
Neutro Abs: 4.8 10*3/uL (ref 1.5–6.5)
Neutrophils Relative %: 62 %
Platelet Count: 321 10*3/uL (ref 145–400)
RBC: 3.28 MIL/uL — ABNORMAL LOW (ref 4.20–5.70)
RDW: 18.1 % — ABNORMAL HIGH (ref 11.1–15.7)
WBC Count: 7.7 10*3/uL (ref 4.0–10.0)

## 2017-12-05 LAB — CMP (CANCER CENTER ONLY)
ALT: 23 U/L (ref 10–47)
AST: 32 U/L (ref 11–38)
Albumin: 3.1 g/dL — ABNORMAL LOW (ref 3.5–5.0)
Alkaline Phosphatase: 60 U/L (ref 26–84)
Anion gap: 12 (ref 5–15)
BUN: 20 mg/dL (ref 7–22)
CO2: 30 mmol/L (ref 18–33)
Calcium: 8.7 mg/dL (ref 8.0–10.3)
Chloride: 99 mmol/L (ref 98–108)
Creatinine: 1.1 mg/dL (ref 0.60–1.20)
Glucose, Bld: 181 mg/dL — ABNORMAL HIGH (ref 73–118)
Potassium: 3.7 mmol/L (ref 3.3–4.7)
Sodium: 141 mmol/L (ref 128–145)
Total Bilirubin: 0.5 mg/dL (ref 0.2–1.6)
Total Protein: 5.8 g/dL — ABNORMAL LOW (ref 6.4–8.1)

## 2017-12-05 LAB — LACTATE DEHYDROGENASE: LDH: 244 U/L — ABNORMAL HIGH (ref 98–192)

## 2017-12-05 MED ORDER — DEXAMETHASONE SODIUM PHOSPHATE 10 MG/ML IJ SOLN
10.0000 mg | Freq: Once | INTRAMUSCULAR | Status: AC
  Administered 2017-12-05: 10 mg via INTRAVENOUS

## 2017-12-05 MED ORDER — SODIUM CHLORIDE 0.9% FLUSH
10.0000 mL | INTRAVENOUS | Status: DC | PRN
  Administered 2017-12-05: 10 mL
  Filled 2017-12-05: qty 10

## 2017-12-05 MED ORDER — ONDANSETRON HCL 4 MG/2ML IJ SOLN
INTRAMUSCULAR | Status: AC
  Filled 2017-12-05: qty 4

## 2017-12-05 MED ORDER — ONDANSETRON HCL 4 MG/2ML IJ SOLN
8.0000 mg | Freq: Once | INTRAMUSCULAR | Status: AC
  Administered 2017-12-05: 8 mg via INTRAVENOUS

## 2017-12-05 MED ORDER — SODIUM CHLORIDE 0.9 % IV SOLN
75.0000 mg | Freq: Once | INTRAVENOUS | Status: AC
  Administered 2017-12-05: 75 mg via INTRAVENOUS
  Filled 2017-12-05: qty 3

## 2017-12-05 MED ORDER — DEXAMETHASONE SODIUM PHOSPHATE 10 MG/ML IJ SOLN
INTRAMUSCULAR | Status: AC
  Filled 2017-12-05: qty 1

## 2017-12-05 MED ORDER — SODIUM CHLORIDE 0.9 % IV SOLN
Freq: Once | INTRAVENOUS | Status: AC
  Administered 2017-12-05: 09:00:00 via INTRAVENOUS
  Filled 2017-12-05: qty 250

## 2017-12-05 MED ORDER — HEPARIN SOD (PORK) LOCK FLUSH 100 UNIT/ML IV SOLN
500.0000 [IU] | Freq: Once | INTRAVENOUS | Status: AC | PRN
  Administered 2017-12-05: 500 [IU]
  Filled 2017-12-05: qty 5

## 2017-12-05 NOTE — Patient Instructions (Signed)
Methotrexate injection What is this medicine? METHOTREXATE (METH oh TREX ate) is a chemotherapy drug used to treat cancer including breast cancer, leukemia, and lymphoma. This medicine can also be used to treat psoriasis and certain kinds of arthritis. This medicine may be used for other purposes; ask your health care provider or pharmacist if you have questions. What should I tell my health care provider before I take this medicine? They need to know if you have any of these conditions: -fluid in the stomach area or lungs -if you often drink alcohol -infection or immune system problems -kidney disease -liver disease -low blood counts, like low white cell, platelet, or red cell counts -lung disease -radiation therapy -stomach ulcers -ulcerative colitis -an unusual or allergic reaction to methotrexate, other medicines, foods, dyes, or preservatives -pregnant or trying to get pregnant -breast-feeding How should I use this medicine? This medicine is for infusion into a vein or for injection into muscle or into the spinal fluid (whichever applies). It is usually given by a health care professional in a hospital or clinic setting. In rare cases, you might get this medicine at home. You will be taught how to give this medicine. Use exactly as directed. Take your medicine at regular intervals. Do not take your medicine more often than directed. If this medicine is used for arthritis or psoriasis, it should be taken weekly, NOT daily. It is important that you put your used needles and syringes in a special sharps container. Do not put them in a trash can. If you do not have a sharps container, call your pharmacist or healthcare provider to get one. Talk to your pediatrician regarding the use of this medicine in children. While this drug may be prescribed for children as young as 2 years for selected conditions, precautions do apply. Overdosage: If you think you have taken too much of this medicine  contact a poison control center or emergency room at once. NOTE: This medicine is only for you. Do not share this medicine with others. What if I miss a dose? It is important not to miss your dose. Call your doctor or health care professional if you are unable to keep an appointment. If you give yourself the medicine and you miss a dose, talk with your doctor or health care professional. Do not take double or extra doses. What may interact with this medicine? This medicine may interact with the following medications: -acitretin -aspirin or aspirin-like medicines including salicylates -azathioprine -certain antibiotics like chloramphenicol, penicillin, tetracycline -certain medicines for stomach problems like esomeprazole, omeprazole, pantoprazole -cyclosporine -gold -hydroxychloroquine -live virus vaccines -mercaptopurine -NSAIDs, medicines for pain and inflammation, like ibuprofen or naproxen -other cytotoxic agents -penicillamine -phenylbutazone -phenytoin -probenacid -retinoids such as isotretinoin and tretinoin -steroid medicines like prednisone or cortisone -sulfonamides like sulfasalazine and trimethoprim/sulfamethoxazole -theophylline This list may not describe all possible interactions. Give your health care provider a list of all the medicines, herbs, non-prescription drugs, or dietary supplements you use. Also tell them if you smoke, drink alcohol, or use illegal drugs. Some items may interact with your medicine. What should I watch for while using this medicine? Avoid alcoholic drinks. In some cases, you may be given additional medicines to help with side effects. Follow all directions for their use. This medicine can make you more sensitive to the sun. Keep out of the sun. If you cannot avoid being in the sun, wear protective clothing and use sunscreen. Do not use sun lamps or tanning beds/booths. You may get drowsy  or dizzy. Do not drive, use machinery, or do anything that  needs mental alertness until you know how this medicine affects you. Do not stand or sit up quickly, especially if you are an older patient. This reduces the risk of dizzy or fainting spells. You may need blood work done while you are taking this medicine. Call your doctor or health care professional for advice if you get a fever, chills or sore throat, or other symptoms of a cold or flu. Do not treat yourself. This drug decreases your body's ability to fight infections. Try to avoid being around people who are sick. This medicine may increase your risk to bruise or bleed. Call your doctor or health care professional if you notice any unusual bleeding. Check with your doctor or health care professional if you get an attack of severe diarrhea, nausea and vomiting, or if you sweat a lot. The loss of too much body fluid can make it dangerous for you to take this medicine. Talk to your doctor about your risk of cancer. You may be more at risk for certain types of cancers if you take this medicine. Both men and women must use effective birth control with this medicine. Do not become pregnant while taking this medicine or until at least 1 normal menstrual cycle has occurred after stopping it. Women should inform their doctor if they wish to become pregnant or think they might be pregnant. Men should not father a child while taking this medicine and for 3 months after stopping it. There is a potential for serious side effects to an unborn child. Talk to your health care professional or pharmacist for more information. Do not breast-feed an infant while taking this medicine. What side effects may I notice from receiving this medicine? Side effects that you should report to your doctor or health care professional as soon as possible: -allergic reactions like skin rash, itching or hives, swelling of the face, lips, or tongue -back pain -breathing problems or shortness of breath -confusion -diarrhea -dry,  nonproductive cough -low blood counts - this medicine may decrease the number of white blood cells, red blood cells and platelets. You may be at increased risk of infections and bleeding -mouth sores -redness, blistering, peeling or loosening of the skin, including inside the mouth -seizures -severe headaches -signs of infection - fever or chills, cough, sore throat, pain or difficulty passing urine -signs and symptoms of bleeding such as bloody or black, tarry stools; red or dark-brown urine; spitting up blood or brown material that looks like coffee grounds; red spots on the skin; unusual bruising or bleeding from the eye, gums, or nose -signs and symptoms of kidney injury like trouble passing urine or change in the amount of urine -signs and symptoms of liver injury like dark yellow or brown urine; general ill feeling or flu-like symptoms; light-colored stools; loss of appetite; nausea; right upper belly pain; unusually weak or tired; yellowing of the eyes or skin -stiff neck -vomiting Side effects that usually do not require medical attention (report to your doctor or health care professional if they continue or are bothersome): -dizziness -hair loss -headache -stomach pain -upset stomach This list may not describe all possible side effects. Call your doctor for medical advice about side effects. You may report side effects to FDA at 1-800-FDA-1088. Where should I keep my medicine? If you are using this medicine at home, you will be instructed on how to store this medicine. Throw away any unused medicine after  the expiration date on the label. NOTE: This sheet is a summary. It may not cover all possible information. If you have questions about this medicine, talk to your doctor, pharmacist, or health care provider.  2018 Elsevier/Gold Standard (2014-07-18 12:36:41)

## 2017-12-05 NOTE — Progress Notes (Signed)
Hematology and Oncology Follow Up Visit  [name] Rose [id number] Feb 04, 1951 67 y.o. 12/05/2017   Principle Diagnosis:  Metastatic high-grade bladder cancer -- recurrent  Current Therapy:  Atezolizumab 1200mg  IV q 3 wks - s/p cycle #4 - d/c due to progression. Taxotere 80mg /m2 IV q 3 wks - s/p cycle #3 -- d/c due to progression M-VAC -- s/p cycle #1    Interim History:  [name] Rose is here today for follow-up.  He looks quite good.  He and his wife were down in [facility] last week.  They just got back yesterday.  They really enjoyed themselves.  He ate quite well.  He now is on M-VAC therapy.  He has tolerated this so far.  He had no mouth sores.  He had no diarrhea.  There was no cough or shortness of breath.  He has had no leg pain or swelling.  He has had no rashes.  There is been no problems with nausea or vomiting.  Overall, his performance status is ECOG 1.  Medications:  Allergies as of 12/05/2017   No Known Allergies     Medication List        Accurate as of 12/05/17  8:15 AM. Always use your most recent med list.          ALEVE 220 MG Caps Generic drug:  Naproxen Sodium Take 220 mg by mouth 2 (two) times daily.   amLODipine 10 MG tablet Commonly known as:  NORVASC Take 10 mg by mouth daily.   bisoprolol-hydrochlorothiazide 10-6.25 MG tablet Commonly known as:  ZIAC Take 1 tablet by mouth daily.   Cinnamon 500 MG Tabs Take 1 tablet by mouth 2 (two) times daily.   dexamethasone 4 MG tablet Commonly known as:  DECADRON Take 2 tablets by mouth once a day on the day after chemotherapy and then take 2 tablets two times a day for 2 days. Take with food.   gabapentin 100 MG capsule Commonly known as:  NEURONTIN Take 3 capsules (300 mg total) by mouth 2 (two) times daily.   lidocaine-prilocaine cream Commonly known as:  EMLA Apply to affected area once   LORazepam 0.5 MG tablet Commonly known as:  ATIVAN Take 1 tablet (0.5 mg total) by mouth every  6 (six) hours as needed (Nausea or vomiting).   losartan 100 MG tablet Commonly known as:  COZAAR Take 100 mg by mouth daily.   magic mouthwash w/lidocaine Soln Take 5 mLs by mouth 4 (four) times daily.   MAGNESIUM PO Take 400 mg by mouth at bedtime. Leg cramps   ondansetron 8 MG tablet Commonly known as:  ZOFRAN Take 1 tablet (8 mg total) by mouth 2 (two) times daily as needed. Start on the third day after chemotherapy.   prochlorperazine 10 MG tablet Commonly known as:  COMPAZINE Take 1 tablet (10 mg total) by mouth every 6 (six) hours as needed (Nausea or vomiting).   vitamin B-6 250 MG tablet Take 1 tablet (250 mg total) by mouth daily.       Allergies: No Known Allergies  Past Medical History, Surgical history, Social history, and Family History were reviewed and updated.  Review of Systems: Review of Systems  Constitutional: Negative.   HENT: Negative.   Eyes: Negative.   Respiratory: Negative.   Cardiovascular: Negative.   Gastrointestinal: Negative.   Genitourinary: Negative.   Musculoskeletal: Negative.   Skin: Negative.   Neurological: Negative.   Endo/Heme/Allergies: Negative.   Psychiatric/Behavioral: Negative.  Physical Exam:  vitals were not taken for this visit.   Wt Readings from Last 3 Encounters:  11/10/17 265 lb 4 oz (120.3 kg)  10/10/17 266 lb (120.7 kg)  09/20/17 270 lb 1.9 oz (122.5 kg)    Physical Exam  Constitutional: He is oriented to person, place, and time.  HENT:  Head: Normocephalic and atraumatic.  Mouth/Throat: Oropharynx is clear and moist.  Eyes: Pupils are equal, round, and reactive to light. EOM are normal.  Neck: Normal range of motion.  Cardiovascular: Normal rate, regular rhythm and normal heart sounds.  Pulmonary/Chest: Effort normal and breath sounds normal.  Abdominal: Soft. Bowel sounds are normal.  Musculoskeletal: Normal range of motion. He exhibits no edema, tenderness or deformity.  Lymphadenopathy:      He has no cervical adenopathy.  Neurological: He is alert and oriented to person, place, and time.  Skin: Skin is warm and dry. No rash noted. No erythema.  Psychiatric: He has a normal mood and affect. His behavior is normal. Judgment and thought content normal.  Vitals reviewed.   Lab Results  Component Value Date   WBC 6.0 11/10/2017   HGB 11.2 (L) 11/10/2017   HCT 36.3 (L) 11/10/2017   MCV 91.2 11/10/2017   PLT 186 11/10/2017   Lab Results  Component Value Date   FERRITIN 673 (H) 11/10/2017   IRON 51 11/10/2017   TIBC 273 11/10/2017   UIBC 222 11/10/2017   IRONPCTSAT 19 (L) 11/10/2017   Lab Results  Component Value Date   RBC 3.98 (L) 11/10/2017   No results found for: KPAFRELGTCHN, LAMBDASER, KAPLAMBRATIO No results found for: IGGSERUM, IGA, IGMSERUM No results found for: [name], SPEI   Chemistry      Component Value Date/Time   NA 142 11/10/2017 0830   NA 141 02/21/2017 0902   K 3.7 11/10/2017 0830   K 4.1 02/21/2017 0902   CL 102 11/10/2017 0830   CL 98 02/21/2017 0902   CO2 31 11/10/2017 0830   CO2 31 02/21/2017 0902   BUN 19 11/10/2017 0830   BUN 17 02/21/2017 0902   CREATININE 1.20 11/10/2017 0830   CREATININE 1.2 02/21/2017 0902      Component Value Date/Time   CALCIUM 9.5 11/10/2017 0830   CALCIUM 9.2 02/21/2017 0902   ALKPHOS 68 11/10/2017 0830   ALKPHOS 65 02/21/2017 0902   AST 35 11/10/2017 0830   ALT 21 11/10/2017 0830   ALT 18 02/21/2017 0902   BILITOT 0.7 11/10/2017 0830      Impression and Plan: [name] Rose is a very pleasant 67 yo caucasian gentleman with metastatic high grade bladder cancer.   We will proceed with his second cycle of MVAC.  I think that this is going to work.  His exam is unremarkable.  We will go ahead and plan for a PET scan after this cycle of treatment.  Hopefully, we will see a good response.  I would like to think that we will see a response since  we really have not used aggressive chemotherapy.  I will plan to see him back in 3 more weeks.   Volanda Napoleon, MD 8/26/20198:15 AM

## 2017-12-06 ENCOUNTER — Inpatient Hospital Stay: Payer: PPO

## 2017-12-06 ENCOUNTER — Encounter: Payer: Self-pay | Admitting: Hematology & Oncology

## 2017-12-06 VITALS — BP 127/62 | HR 86 | Temp 97.7°F | Resp 18

## 2017-12-06 DIAGNOSIS — C772 Secondary and unspecified malignant neoplasm of intra-abdominal lymph nodes: Secondary | ICD-10-CM

## 2017-12-06 DIAGNOSIS — C679 Malignant neoplasm of bladder, unspecified: Secondary | ICD-10-CM

## 2017-12-06 DIAGNOSIS — Z5111 Encounter for antineoplastic chemotherapy: Secondary | ICD-10-CM | POA: Diagnosis not present

## 2017-12-06 MED ORDER — VINBLASTINE SULFATE CHEMO INJECTION 1 MG/ML
2.0000 mg/m2 | Freq: Once | INTRAVENOUS | Status: AC
  Administered 2017-12-06: 5 mg via INTRAVENOUS
  Filled 2017-12-06: qty 5

## 2017-12-06 MED ORDER — DOXORUBICIN HCL CHEMO IV INJECTION 2 MG/ML
24.0000 mg/m2 | Freq: Once | INTRAVENOUS | Status: AC
  Administered 2017-12-06: 60 mg via INTRAVENOUS
  Filled 2017-12-06: qty 30

## 2017-12-06 MED ORDER — SODIUM CHLORIDE 0.9 % IV SOLN
Freq: Once | INTRAVENOUS | Status: AC
  Administered 2017-12-06: 11:00:00 via INTRAVENOUS
  Filled 2017-12-06: qty 250

## 2017-12-06 MED ORDER — SODIUM CHLORIDE 0.9 % IV SOLN
56.0000 mg/m2 | Freq: Once | INTRAVENOUS | Status: AC
  Administered 2017-12-06: 141 mg via INTRAVENOUS
  Filled 2017-12-06: qty 100

## 2017-12-06 MED ORDER — SODIUM CHLORIDE 0.9 % IV SOLN
Freq: Once | INTRAVENOUS | Status: AC
  Administered 2017-12-06: 11:00:00 via INTRAVENOUS
  Filled 2017-12-06: qty 5

## 2017-12-06 MED ORDER — PALONOSETRON HCL INJECTION 0.25 MG/5ML
INTRAVENOUS | Status: AC
  Filled 2017-12-06: qty 5

## 2017-12-06 MED ORDER — HEPARIN SOD (PORK) LOCK FLUSH 100 UNIT/ML IV SOLN
500.0000 [IU] | Freq: Once | INTRAVENOUS | Status: AC | PRN
  Administered 2017-12-06: 500 [IU]
  Filled 2017-12-06: qty 5

## 2017-12-06 MED ORDER — PEGFILGRASTIM 6 MG/0.6ML ~~LOC~~ PSKT
PREFILLED_SYRINGE | SUBCUTANEOUS | Status: AC
  Filled 2017-12-06: qty 0.6

## 2017-12-06 MED ORDER — PALONOSETRON HCL INJECTION 0.25 MG/5ML
0.2500 mg | Freq: Once | INTRAVENOUS | Status: AC
  Administered 2017-12-06: 0.25 mg via INTRAVENOUS

## 2017-12-06 MED ORDER — SODIUM CHLORIDE 0.9% FLUSH
10.0000 mL | INTRAVENOUS | Status: DC | PRN
  Administered 2017-12-06: 10 mL
  Filled 2017-12-06: qty 10

## 2017-12-06 MED ORDER — POTASSIUM CHLORIDE 2 MEQ/ML IV SOLN
Freq: Once | INTRAVENOUS | Status: AC
  Administered 2017-12-06: 09:00:00 via INTRAVENOUS
  Filled 2017-12-06: qty 10

## 2017-12-06 MED ORDER — PEGFILGRASTIM 6 MG/0.6ML ~~LOC~~ PSKT
6.0000 mg | PREFILLED_SYRINGE | Freq: Once | SUBCUTANEOUS | Status: AC
  Administered 2017-12-06: 6 mg via SUBCUTANEOUS

## 2017-12-06 NOTE — Progress Notes (Signed)
Vinblastine to remain at 2 mg/m2 today per [name]. Will change $RemoveBef'3mg'QNIFETsmRg$ /m2 dose to 2 mg/m2 at verification per V.O.

## 2017-12-06 NOTE — Patient Instructions (Signed)
[facility] Discharge Instructions for Patients Receiving Chemotherapy  Today you received the following chemotherapy agents Cisplatin, Adriamycin, Velban  To help prevent nausea and vomiting after your treatment, we encourage you to take your nausea medication    If you develop nausea and vomiting that is not controlled by your nausea medication, call the clinic.   BELOW ARE SYMPTOMS THAT SHOULD BE REPORTED IMMEDIATELY:  *FEVER GREATER THAN 100.5 F  *CHILLS WITH OR WITHOUT FEVER  NAUSEA AND VOMITING THAT IS NOT CONTROLLED WITH YOUR NAUSEA MEDICATION  *UNUSUAL SHORTNESS OF BREATH  *UNUSUAL BRUISING OR BLEEDING  TENDERNESS IN MOUTH AND THROAT WITH OR WITHOUT PRESENCE OF ULCERS  *URINARY PROBLEMS  *BOWEL PROBLEMS  UNUSUAL RASH Items with * indicate a potential emergency and should be followed up as soon as possible.  Feel free to call the clinic should you have any questions or concerns. The clinic phone number is [phone].  Please show the [facility] at check-in to the Emergency Department and triage nurse.

## 2017-12-16 ENCOUNTER — Telehealth: Payer: Self-pay | Admitting: *Deleted

## 2017-12-16 NOTE — Telephone Encounter (Signed)
Patient calling to say he is so constipated, he is thinking of going to the E.R. Instructed patient to drink 1/2 bottle of mag citrate if no results in 1 hour, drink the remaining half. If no results call us back.

## 2017-12-19 ENCOUNTER — Inpatient Hospital Stay (HOSPITAL_COMMUNITY)
Admission: EM | Admit: 2017-12-19 | Discharge: 2017-12-23 | DRG: 872 | Disposition: A | Discharge status: Home / Self Care | Payer: PPO | Attending: Internal Medicine | Admitting: Internal Medicine

## 2017-12-19 ENCOUNTER — Telehealth: Payer: Self-pay | Admitting: *Deleted

## 2017-12-19 ENCOUNTER — Observation Stay (HOSPITAL_COMMUNITY): Payer: PPO

## 2017-12-19 ENCOUNTER — Encounter (HOSPITAL_COMMUNITY): Payer: Self-pay

## 2017-12-19 ENCOUNTER — Other Ambulatory Visit: Payer: Self-pay

## 2017-12-19 ENCOUNTER — Ambulatory Visit (HOSPITAL_COMMUNITY)
Admission: RE | Admit: 2017-12-19 | Discharge: 2017-12-19 | Disposition: A | Discharge status: Home / Self Care | Payer: PPO | Source: Ambulatory Visit | Attending: Hematology & Oncology | Admitting: Hematology & Oncology

## 2017-12-19 DIAGNOSIS — Z9841 Cataract extraction status, right eye: Secondary | ICD-10-CM

## 2017-12-19 DIAGNOSIS — D630 Anemia in neoplastic disease: Secondary | ICD-10-CM | POA: Diagnosis present

## 2017-12-19 DIAGNOSIS — Z96642 Presence of left artificial hip joint: Secondary | ICD-10-CM | POA: Diagnosis present

## 2017-12-19 DIAGNOSIS — Z66 Do not resuscitate: Secondary | ICD-10-CM | POA: Diagnosis present

## 2017-12-19 DIAGNOSIS — Z9842 Cataract extraction status, left eye: Secondary | ICD-10-CM | POA: Diagnosis not present

## 2017-12-19 DIAGNOSIS — Z9079 Acquired absence of other genital organ(s): Secondary | ICD-10-CM | POA: Diagnosis not present

## 2017-12-19 DIAGNOSIS — E876 Hypokalemia: Secondary | ICD-10-CM | POA: Diagnosis present

## 2017-12-19 DIAGNOSIS — K5901 Slow transit constipation: Secondary | ICD-10-CM

## 2017-12-19 DIAGNOSIS — Z8546 Personal history of malignant neoplasm of prostate: Secondary | ICD-10-CM | POA: Diagnosis not present

## 2017-12-19 DIAGNOSIS — M1990 Unspecified osteoarthritis, unspecified site: Secondary | ICD-10-CM | POA: Diagnosis present

## 2017-12-19 DIAGNOSIS — Z87891 Personal history of nicotine dependence: Secondary | ICD-10-CM | POA: Diagnosis not present

## 2017-12-19 DIAGNOSIS — D696 Thrombocytopenia, unspecified: Secondary | ICD-10-CM | POA: Diagnosis present

## 2017-12-19 DIAGNOSIS — K5720 Diverticulitis of large intestine with perforation and abscess without bleeding: Secondary | ICD-10-CM | POA: Diagnosis present

## 2017-12-19 DIAGNOSIS — Z961 Presence of intraocular lens: Secondary | ICD-10-CM | POA: Diagnosis present

## 2017-12-19 DIAGNOSIS — Z9221 Personal history of antineoplastic chemotherapy: Secondary | ICD-10-CM | POA: Diagnosis not present

## 2017-12-19 DIAGNOSIS — B9620 Unspecified Escherichia coli [E. coli] as the cause of diseases classified elsewhere: Secondary | ICD-10-CM | POA: Diagnosis present

## 2017-12-19 DIAGNOSIS — K567 Ileus, unspecified: Secondary | ICD-10-CM | POA: Diagnosis not present

## 2017-12-19 DIAGNOSIS — C772 Secondary and unspecified malignant neoplasm of intra-abdominal lymph nodes: Secondary | ICD-10-CM

## 2017-12-19 DIAGNOSIS — A419 Sepsis, unspecified organism: Principal | ICD-10-CM | POA: Diagnosis present

## 2017-12-19 DIAGNOSIS — Z8249 Family history of ischemic heart disease and other diseases of the circulatory system: Secondary | ICD-10-CM | POA: Diagnosis not present

## 2017-12-19 DIAGNOSIS — I10 Essential (primary) hypertension: Secondary | ICD-10-CM | POA: Diagnosis present

## 2017-12-19 DIAGNOSIS — K651 Peritoneal abscess: Secondary | ICD-10-CM | POA: Diagnosis present

## 2017-12-19 DIAGNOSIS — C679 Malignant neoplasm of bladder, unspecified: Secondary | ICD-10-CM | POA: Diagnosis present

## 2017-12-19 DIAGNOSIS — R110 Nausea: Secondary | ICD-10-CM | POA: Diagnosis not present

## 2017-12-19 DIAGNOSIS — R7303 Prediabetes: Secondary | ICD-10-CM | POA: Diagnosis present

## 2017-12-19 DIAGNOSIS — D649 Anemia, unspecified: Secondary | ICD-10-CM | POA: Diagnosis not present

## 2017-12-19 DIAGNOSIS — R1030 Lower abdominal pain, unspecified: Secondary | ICD-10-CM | POA: Diagnosis not present

## 2017-12-19 DIAGNOSIS — Z79899 Other long term (current) drug therapy: Secondary | ICD-10-CM

## 2017-12-19 DIAGNOSIS — M129 Arthropathy, unspecified: Secondary | ICD-10-CM | POA: Diagnosis not present

## 2017-12-19 DIAGNOSIS — L02211 Cutaneous abscess of abdominal wall: Secondary | ICD-10-CM | POA: Diagnosis not present

## 2017-12-19 DIAGNOSIS — K5900 Constipation, unspecified: Secondary | ICD-10-CM | POA: Diagnosis not present

## 2017-12-19 LAB — BASIC METABOLIC PANEL
Anion gap: 12 (ref 5–15)
BUN: 18 mg/dL (ref 8–23)
CO2: 33 mmol/L — ABNORMAL HIGH (ref 22–32)
Calcium: 8.4 mg/dL — ABNORMAL LOW (ref 8.9–10.3)
Chloride: 93 mmol/L — ABNORMAL LOW (ref 98–111)
Creatinine, Ser: 1.04 mg/dL (ref 0.61–1.24)
GFR calc Af Amer: >60 mL/min (ref >60)
GFR calc non Af Amer: >60 mL/min (ref >60)
Glucose, Bld: 113 mg/dL — ABNORMAL HIGH (ref 70–99)
Potassium: 3.1 mmol/L — ABNORMAL LOW (ref 3.5–5.1)
Sodium: 138 mmol/L (ref 135–145)

## 2017-12-19 LAB — CBC
HCT: 26.7 % — ABNORMAL LOW (ref 39.0–52.0)
Hemoglobin: 8.6 g/dL — ABNORMAL LOW (ref 13.0–17.0)
MCH: 28.5 pg (ref 26.0–34.0)
MCHC: 32.2 g/dL (ref 30.0–36.0)
MCV: 88.4 fL (ref 78.0–100.0)
Platelets: 101 10*3/uL — ABNORMAL LOW (ref 150–400)
RBC: 3.02 MIL/uL — ABNORMAL LOW (ref 4.22–5.81)
RDW: 17.5 % — ABNORMAL HIGH (ref 11.5–15.5)
WBC: 9.7 10*3/uL (ref 4.0–10.5)

## 2017-12-19 LAB — PROTIME-INR
INR: 1.05
Prothrombin Time: 13.6 seconds (ref 11.4–15.2)

## 2017-12-19 LAB — GLUCOSE, CAPILLARY: Glucose-Capillary: 138 mg/dL — ABNORMAL HIGH (ref 70–99)

## 2017-12-19 MED ORDER — ALBUTEROL SULFATE (2.5 MG/3ML) 0.083% IN NEBU: 2.5000 mg | INHALATION_SOLUTION | RESPIRATORY_TRACT | Status: DC | PRN

## 2017-12-19 MED ORDER — ONDANSETRON HCL 4 MG/2ML IJ SOLN: 4.0000 mg | Freq: Four times a day (QID) | INTRAMUSCULAR | Status: DC | PRN

## 2017-12-19 MED ORDER — MIDAZOLAM HCL 2 MG/2ML IJ SOLN
INTRAMUSCULAR | Status: AC
  Administered 2017-12-19: 17:00:00
  Filled 2017-12-19: qty 6

## 2017-12-19 MED ORDER — FLUDEOXYGLUCOSE F - 18 (FDG) INJECTION
13.6000 | Freq: Once | INTRAVENOUS | Status: AC | PRN
  Administered 2017-12-19: 13.6 via INTRAVENOUS

## 2017-12-19 MED ORDER — GABAPENTIN 300 MG PO CAPS
300.0000 mg | ORAL_CAPSULE | Freq: Two times a day (BID) | ORAL | Status: DC
  Administered 2017-12-19 – 2017-12-23 (×8): 300 mg via ORAL
  Filled 2017-12-19 (×8): qty 1

## 2017-12-19 MED ORDER — SODIUM CHLORIDE 0.9 % IV SOLN
INTRAVENOUS | Status: DC
  Administered 2017-12-20: 19:00:00 via INTRAVENOUS

## 2017-12-19 MED ORDER — POTASSIUM CHLORIDE CRYS ER 20 MEQ PO TBCR
40.0000 meq | EXTENDED_RELEASE_TABLET | Freq: Once | ORAL | Status: DC
  Filled 2017-12-19: qty 2

## 2017-12-19 MED ORDER — ACETAMINOPHEN 650 MG RE SUPP: 650.0000 mg | Freq: Four times a day (QID) | RECTAL | Status: DC | PRN

## 2017-12-19 MED ORDER — AMLODIPINE BESYLATE 5 MG PO TABS: 10.0000 mg | ORAL_TABLET | Freq: Every day | ORAL | Status: DC

## 2017-12-19 MED ORDER — TRAZODONE HCL 50 MG PO TABS: 25.0000 mg | ORAL_TABLET | Freq: Every evening | ORAL | Status: DC | PRN

## 2017-12-19 MED ORDER — SODIUM CHLORIDE 0.9% FLUSH
5.0000 mL | Freq: Three times a day (TID) | INTRAVENOUS | Status: DC
  Administered 2017-12-21 – 2017-12-23 (×6): 5 mL

## 2017-12-19 MED ORDER — SODIUM CHLORIDE 0.9 % IV SOLN
INTRAVENOUS | Status: AC
  Filled 2017-12-19: qty 250

## 2017-12-19 MED ORDER — TRAMADOL HCL 50 MG PO TABS: 50.0000 mg | ORAL_TABLET | Freq: Four times a day (QID) | ORAL | Status: DC | PRN

## 2017-12-19 MED ORDER — ONDANSETRON HCL 4 MG PO TABS: 4.0000 mg | ORAL_TABLET | Freq: Four times a day (QID) | ORAL | Status: DC | PRN

## 2017-12-19 MED ORDER — ENOXAPARIN SODIUM 40 MG/0.4ML ~~LOC~~ SOLN
40.0000 mg | SUBCUTANEOUS | Status: DC
  Administered 2017-12-19 – 2017-12-22 (×4): 40 mg via SUBCUTANEOUS
  Filled 2017-12-19 (×4): qty 0.4

## 2017-12-19 MED ORDER — AMLODIPINE BESYLATE 10 MG PO TABS
10.0000 mg | ORAL_TABLET | Freq: Every day | ORAL | Status: DC
  Administered 2017-12-20 – 2017-12-23 (×4): 10 mg via ORAL
  Filled 2017-12-19 (×4): qty 1

## 2017-12-19 MED ORDER — FENTANYL CITRATE (PF) 100 MCG/2ML IJ SOLN
INTRAMUSCULAR | Status: AC
  Administered 2017-12-19: 17:00:00
  Filled 2017-12-19: qty 2

## 2017-12-19 MED ORDER — MIDAZOLAM HCL 2 MG/2ML IJ SOLN
INTRAMUSCULAR | Status: AC | PRN
  Administered 2017-12-19 (×3): 1 mg via INTRAVENOUS

## 2017-12-19 MED ORDER — ACETAMINOPHEN 325 MG PO TABS
650.0000 mg | ORAL_TABLET | Freq: Four times a day (QID) | ORAL | Status: DC | PRN
  Administered 2017-12-19: 650 mg via ORAL
  Filled 2017-12-19: qty 2

## 2017-12-19 MED ORDER — FENTANYL CITRATE (PF) 100 MCG/2ML IJ SOLN
INTRAMUSCULAR | Status: AC | PRN
  Administered 2017-12-19 (×2): 50 ug via INTRAVENOUS

## 2017-12-19 MED ORDER — SODIUM CHLORIDE 0.9 % IV BOLUS
1000.0000 mL | Freq: Once | INTRAVENOUS | Status: AC
  Administered 2017-12-19: 1000 mL via INTRAVENOUS

## 2017-12-19 MED ORDER — PIPERACILLIN-TAZOBACTAM 3.375 G IVPB 30 MIN
3.3750 g | Freq: Once | INTRAVENOUS | Status: AC
  Administered 2017-12-19: 3.375 g via INTRAVENOUS
  Filled 2017-12-19: qty 50

## 2017-12-19 MED ORDER — PIPERACILLIN-TAZOBACTAM 3.375 G IVPB
3.3750 g | Freq: Three times a day (TID) | INTRAVENOUS | Status: DC
  Administered 2017-12-19 – 2017-12-23 (×11): 3.375 g via INTRAVENOUS
  Filled 2017-12-19 (×12): qty 50

## 2017-12-19 MED ORDER — SENNOSIDES-DOCUSATE SODIUM 8.6-50 MG PO TABS: 1.0000 | ORAL_TABLET | Freq: Every evening | ORAL | Status: DC | PRN

## 2017-12-19 NOTE — Consult Note (Signed)
Chief Complaint: Patient was seen in consultation today for image guided aspiration and drainage of pelvic abscess  Referring Physician(s): Dr. Lennette Bihari Campos/Dr. Burney Gauze  Supervising Physician: Jacqulynn Cadet  Patient Status: [facility] - In-pt  History of Present Illness: [name] Rose is a 67 y.o. male with a past medical history significant for HTN, history of prostate cancer s/p radical prostatectomy (2008) and metastatic bladder cancer for which he is followed by [name] and being treated with M-VAC chemotherapy. Patient received outpatient PET scan today which showed improvement in nodal disease however there was development of a fluid and gas collection in the central pelvis which tracked along the sigmoid colon. He was sent to ED for further evaluation by [name] and consults were placed to both general surgery and IR for management.   Patient reports he has been experiencing alternating constipation and diarrhea for about a week, with constipation being his main concern which he believes to be caused by his chemotherapy. He states he has tried magnesium citrate which helped his constipation somewhat but he does not feel that he has had a complete bowel movement in over a week. He states he had some vomiting after eating dinner on Saturday however this has resolved, he denies nausea and his appetite has been the same as usual. He denies abdominal pain, melena, hematochezia, dysuria, dyspnea or chest pain. He states his last bowel movement was earlier today which he describes as very small. He reports history of radical prostatectomy but denies any other abdominal surgery. He states last PO intake was last night aside from some sips of water and chewing a piece of gum. He does not take any blood thinners.   Per surgery note patient presentation and imaging is not concerning for acute abdomen and does not require surgical intervention at this time, instead their service  recommends percutaneous aspiration and drainage of abscess. Imaging reviewed with Dr. Laurence Ferrari today who is agreeable to proceed with aspiration and drainage of pelvic abscess.  Past Medical History:  Diagnosis Date  . Arthritis   . 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 ([place]) 09/29/2016  . Bladder tumor   . Goals of care, counseling/discussion 09/30/2016  . History of prostate cancer followed by pcp dr [name]-  per pt last PSA undetectable   dx 2008-- (Stage T1c, Gleason 3+3,  PSA 4.58, vol 99cc)  s/p  radical prostatectomy (nerve sparing bilateral)   . Hypertension   . Lower urinary tract symptoms (LUTS)   . Pre-diabetes   . Wears glasses     Past Surgical History:  Procedure Laterality Date  . CATARACT EXTRACTION W/ INTRAOCULAR LENS  IMPLANT, BILATERAL Bilateral 2011  . [place]  . IR FLUORO GUIDE PORT INSERTION RIGHT  10/07/2016  . IR US GUIDE VASC ACCESS RIGHT  10/07/2016  . KNEE ARTHROSCOPY Bilateral right 2006;  left 02-15-2007  . [place];  1990;  1983  . ROBOT ASSISTED LAPAROSCOPIC RADICAL PROSTATECTOMY  06/20/2006   bilateral nerve sparing  . TOTAL HIP ARTHROPLASTY Left 03/03/2015   Procedure: LEFT TOTAL HIP ARTHROPLASTY ANTERIOR APPROACH;  Surgeon: Dorna Leitz, MD;  Location: [facility];  Service: Orthopedics;  Laterality: Left;  . TOTAL KNEE ARTHROPLASTY Bilateral left 08-13-2009;  right 12-26-2009  . TRANSURETHRAL RESECTION OF BLADDER TUMOR N/A 09/20/2016   Procedure: TRANSURETHRAL RESECTION OF BLADDER TUMOR (TURBT);  Surgeon: Franchot Gallo, MD;  Location: [facility];  Service: Urology;  Laterality: N/A;  Allergies: Patient has no known allergies.  Medications: Prior to Admission medications   Medication Sig Start Date End Date Taking? Authorizing Provider  amLODipine (NORVASC) 10 MG tablet Take 10 mg by mouth daily.   Yes [provider]  bisoprolol-hydrochlorothiazide (ZIAC)  10-6.25 MG tablet Take 1 tablet by mouth daily.   Yes [provider]  Cinnamon 500 MG TABS Take 1 tablet by mouth 2 (two) times daily.    Yes [provider]  dexamethasone (DECADRON) 4 MG tablet Take 2 tablets by mouth once a day on the day after chemotherapy and then take 2 tablets two times a day for 2 days. Take with food. 11/10/17  Yes Volanda Napoleon, MD  gabapentin (NEURONTIN) 100 MG capsule Take 3 capsules (300 mg total) by mouth 2 (two) times daily. 09/20/17  Yes Volanda Napoleon, MD  lidocaine-prilocaine (EMLA) cream Apply to affected area once 11/10/17  Yes Ennever, Rudell Cobb, MD  LORazepam (ATIVAN) 0.5 MG tablet Take 1 tablet (0.5 mg total) by mouth every 6 (six) hours as needed (Nausea or vomiting). 11/10/17  Yes Volanda Napoleon, MD  losartan (COZAAR) 100 MG tablet Take 100 mg by mouth daily.   Yes [provider]  MAGNESIUM PO Take 400 mg by mouth at bedtime. Leg cramps   Yes [provider]  Naproxen Sodium (ALEVE) 220 MG CAPS Take 220 mg by mouth 2 (two) times daily.    Yes [provider]  ondansetron (ZOFRAN) 8 MG tablet Take 1 tablet (8 mg total) by mouth 2 (two) times daily as needed. Start on the third day after chemotherapy. 11/10/17  Yes Volanda Napoleon, MD  prochlorperazine (COMPAZINE) 10 MG tablet Take 1 tablet (10 mg total) by mouth every 6 (six) hours as needed (Nausea or vomiting). 11/10/17  Yes Volanda Napoleon, MD  Pyridoxine HCl (VITAMIN B-6) 250 MG tablet Take 1 tablet (250 mg total) by mouth daily. Patient taking differently: Take 300 mg by mouth daily.  05/24/17  Yes Volanda Napoleon, MD  magic mouthwash w/lidocaine SOLN Take 5 mLs by mouth 4 (four) times daily. Patient not taking: Reported on 09/20/2017 08/10/17   Volanda Napoleon, MD     Family History  Problem Relation Age of Onset  . Hypertension Mother   . Aneurysm Mother   . Emphysema Father   . Hypertension Father     Social History   Socioeconomic History  . Marital  status: Married    Spouse name: Not on file  . Number of children: Not on file  . Years of education: Not on file  . Highest education level: Not on file  Occupational History  . Not on file  Social Needs  . Financial resource strain: Not on file  . Food insecurity:    Worry: Not on file    Inability: Not on file  . Transportation needs:    Medical: Not on file    Non-medical: Not on file  Tobacco Use  . Smoking status: Former Smoker    Years: 16.00    Types: Cigarettes    Last attempt to quit: 09/25/1984    Years since quitting: 33.2  . Smokeless tobacco: Never Used  Substance and Sexual Activity  . Alcohol use: Yes    Comment: occasionally  . Drug use: No  . Sexual activity: Not on file  Lifestyle  . Physical activity:    Days per week: Not on file    Minutes per session: Not  on file  . Stress: Not on file  Relationships  . Social connections:    Talks on phone: Not on file    Gets together: Not on file    Attends religious service: Not on file    Active member of club or organization: Not on file    Attends meetings of clubs or organizations: Not on file    Relationship status: Not on file  Other Topics Concern  . Not on file  Social History Narrative  . Not on file     Review of Systems: A 12 point ROS discussed and pertinent positives are indicated in the HPI above.  All other systems are negative.  Review of Systems  Constitutional: Positive for fever (intermittent low grade (<100); none today). Negative for appetite change, chills and fatigue.  Respiratory: Negative for cough and shortness of breath.   Gastrointestinal: Positive for constipation and diarrhea. Negative for abdominal distention, abdominal pain, blood in stool, nausea and vomiting.  Musculoskeletal: Negative for back pain.  Skin: Negative for rash.  Neurological: Negative for syncope and weakness.  Psychiatric/Behavioral: Negative for confusion.    Vital Signs: BP 118/71   Pulse 82    Temp 98.9 F (37.2 C) (Oral)   Resp 18   Ht $R'6\' 1"'vi$  (1.854 m)   Wt 268 lb (121.6 kg)   SpO2 95%   BMI 35.36 kg/m   Physical Exam  Constitutional: He is oriented to person, place, and time. No distress.  HENT:  Head: Normocephalic.  Cardiovascular: Normal rate, regular rhythm and normal heart sounds.  Pulmonary/Chest: Effort normal and breath sounds normal.  Abdominal: Soft. Bowel sounds are normal. He exhibits no distension and no mass. There is no tenderness.  Neurological: He is alert and oriented to person, place, and time.  Skin: Skin is warm and dry. No rash noted. He is not diaphoretic.  Psychiatric: He has a normal mood and affect. His behavior is normal. Judgment and thought content normal.  Nursing note and vitals reviewed.    MD Evaluation Airway: WNL Heart: WNL Abdomen: WNL Chest/ Lungs: WNL ASA  Classification: 3 Mallampati/Airway Score: One   Imaging: Nm Pet Image Restag (ps) Skull Base To Thigh  Result Date: 12/19/2017 CLINICAL DATA:  Subsequent treatment strategy for bladder cancer. EXAM: NUCLEAR MEDICINE PET SKULL BASE TO THIGH TECHNIQUE: 13.6 mCi F-18 FDG was injected intravenously. Full-ring PET imaging was performed from the skull base to thigh after the radiotracer. CT data was obtained and used for attenuation correction and anatomic localization. Fasting blood glucose: 138 mg/dl COMPARISON:  10/27/2017 FINDINGS: Mediastinal blood pool activity: SUV max 2.5 NECK: Hypermetabolic left supraclavicular load seen on the previous study has decreased in size measuring 4 mm today compared to 11 mm previously. No residual hypermetabolism in this lymph node on today's exam. No new cervical lymphadenopathy. Incidental CT findings: none CHEST: No hypermetabolic pulmonary nodule or mass. No hypermetabolic mediastinal or hilar lymphadenopathy. Incidental CT findings: No suspicious pulmonary nodule or mass. ABDOMEN/PELVIS: Interval development of a 10.8 x 7.1 cm fluid collection  with air-fluid level in the central pelvis, tracking along and involving the sigmoid colon. Circumferential wall thickening is noted in the distal sigmoid colon just beyond this large fluid collection. The rim of the collection is hypermetabolic. There is an associated 2.3 cm focal gas collection in the root of the sigmoid mesocolon (4:67). No hypermetabolic soft tissue metastases evident in the abdomen. Hypermetabolic right pelvic lymphadenopathy on the prior study has decreased in the interval.  18 mm short axis external iliac lymph node measured previously has decreased to 12 mm short axis. SUV max = 2.4 on today's study compared to 19.3 previously. Other lymph nodes in the same region show similar interval decrease in size and hypermetabolism. Incidental CT findings: There is abdominal aortic atherosclerosis without aneurysm. Beam hardening artifact from left hip replacement obscures inferior pelvis. Calcification associated with the right bladder wall is similar to prior. SKELETON: Diffuse FDG accumulation in the marrow space is new in the interval and presumably reflects stimulatory effects of therapy. Incidental CT findings: none IMPRESSION: 1. Interval development of a 10.8 x 7.1 cm collection of fluid and gas in the central pelvis, tracking along the sigmoid colon. Inflammatory changes track proximally in the sigmoid mesocolon where a discrete 2.3 cm gas collection is identified. Imaging features are compatible with pelvic abscess, potentially diverticular in origin. 2. Interval decrease in size and hypermetabolism of previously documented nodal disease in the left supraclavicular region and right anterior pelvic sidewall. 3. Diffuse marrow accumulation of FDG on the current study presumably related to stimulatory effects of therapy. 4.  Aortic Atherosclerois (ICD10-170.0) These results will be called to the ordering clinician or representative by the Radiologist Assistant, and communication documented in  the PACS or zVision Dashboard. Electronically Signed   By: Misty Stanley M.D.   On: 12/19/2017 09:58    Labs:  CBC: Recent Labs    10/10/17 0845 11/10/17 0830 12/05/17 0808 12/19/17 1256  WBC 13.2* 6.0 7.7 9.7  HGB 9.9* 11.2* 9.5* 8.6*  HCT 31.3* 36.3* 30.3* 26.7*  PLT 215 186 321 101*    COAGS: Recent Labs    04/21/17 0729 12/19/17 1256  INR 0.99 1.05    BMP: Recent Labs    04/21/17 0729  05/14/17 1952  10/10/17 0845 11/10/17 0830 12/05/17 0808 12/19/17 1256  NA 139   < > 135   < > 142 142 141 138  K 4.0   < > 4.2   < > 4.0 3.7 3.7 3.1*  CL 103   < > 97*   < > 102 102 99 93*  CO2 28   < > 28   < > $R'27 31 30 'Vk$ 33*  GLUCOSE 116*   < > 112*   < > 204* 159* 181* 113*  BUN 30*   < > 21*   < > $R'22 19 20 18  'GA$ CALCIUM 8.9   < > 9.0   < > 9.5 9.5 8.7 8.4*  CREATININE 1.21   < > 1.33*   < > 0.90 1.20 1.10 1.04  GFRNONAA >60  --  55*  --   --   --   --  >60  GFRAA >60  --  >60  --   --   --   --  >60   < > = values in this interval not displayed.    LIVER FUNCTION TESTS: Recent Labs    09/20/17 0836 10/10/17 0845 11/10/17 0830 12/05/17 0808  BILITOT 0.7 0.7 0.7 0.5  AST 34 32 35 32  ALT 32 $Remo'25 21 23  'PYwHE$ ALKPHOS 67 56 68 60  PROT 6.2* 6.3* 6.2* 5.8*  ALBUMIN 3.0* 3.4* 3.3* 3.1*    TUMOR MARKERS: No results for input(s): AFPTM, CEA, CA199, CHROMGRNA in the last 8760 hours.  Assessment and Plan:  Patient with history of metastatic bladder cancer followed by [name] currently receiving M-VAC chemotherapy presented for PET scan today, it was noted that nodal disease had  decreased however there was a new finding of 10.8 x 7.1 cm fluid and gas collection in the central pelvis which tracked along the sigmoid colon. He was evaluated by general surgery who felt there was no indication for surgical intervention at this time given patient essentially asymptomatic. IR was consulted as well for percutaneous aspiration and drainage today. He has been started on IV Zosyn in ED  and to be admitted for further observation. Patient has been NPO since last night aside from small sips of water with medications this morning, he does not take blood thinning medication, he is afebrile currently, WBC 9.7, INR 1.05, H/H 8.6/26.7 today. Will proceed with aspiration and drainage of pelvic abscess in IR this afternoon - IR will follow while patient is admitted.  Risks and benefits discussed with the patient including bleeding, infection, damage to adjacent structures, bowel perforation/fistula connection, and sepsis.  All of the patient's questions were answered, patient is agreeable to proceed.  Consent signed and in chart.  Thank you for this interesting consult.  I greatly enjoyed meeting [name] Rose and look forward to participating in their care.  A copy of this report was sent to the requesting provider on this date.  Electronically Signed: Joaquim Nam, PA-C 12/19/2017, 3:08 PM   I spent a total of 40 Minutes in face to face in clinical consultation, greater than 50% of which was counseling/coordinating care for pelvic abscess drain.

## 2017-12-19 NOTE — Consult Note (Signed)
[name] Rose [birth year]  [id number].    Requesting MD: Dr. Jola Schmidt Chief Complaint/Reason for Consult: intra-abdominal abscess  HPI:  This is a 67 yo white male with a history of metastatic bladder cancer who is currently being treated by [name] with M-VAC chemotherapy.  He has had a prostatectomy as well as a TURP in the past year.  About a week ago he developed constipation secondary to his chemotherapy.  He drank some magnesium citrate and had some results, but did not empty his colon.  He ate supper on Saturday night and then developed vomiting.  He has not had any further nausea or vomiting since that time.  He has been eating well since then.  He denies any significant abdominal pain that concerned him at all.  He states he had a low grade temp of 99-100 but when he does this it's usually because he is dehydrated.  He took mag citrate again last night still with minimal results.  He came to the hospital today for a PET scan as he started M-VAC recently.  He has had 2 cycles so far 3 weeks apart.  His last treatment was 2 weeks ago.  He usually takes Decadron about 2-3 days after each treatment.  Today after his PET scan, it was found on his read report that he has a 10x7cm pelvic fluid collection with gas and fluid, c/w abscess.  He is being admitted by the hospitalist and we have been asked to see him for further evaluation and recommendations.  He denies any other complaints or symptoms.   ROS: ROS: Please see HPI, otherwise all other systems have been reviewed and are negative.  Family History  Problem Relation Age of Onset  . Hypertension Mother   . Aneurysm Mother   . Emphysema Father   . Hypertension Father     Past Medical History:  Diagnosis Date  . Arthritis   . 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 ([place]) 09/29/2016  . Bladder tumor   . Goals of care, counseling/discussion 09/30/2016  . History of prostate cancer followed by pcp dr  [name]-  per pt last PSA undetectable   dx 2008-- (Stage T1c, Gleason 3+3,  PSA 4.58, vol 99cc)  s/p  radical prostatectomy (nerve sparing bilateral)   . Hypertension   . Lower urinary tract symptoms (LUTS)   . Pre-diabetes   . Wears glasses     Past Surgical History:  Procedure Laterality Date  . CATARACT EXTRACTION W/ INTRAOCULAR LENS  IMPLANT, BILATERAL Bilateral 2011  . [place]  . IR FLUORO GUIDE PORT INSERTION RIGHT  10/07/2016  . IR US GUIDE VASC ACCESS RIGHT  10/07/2016  . KNEE ARTHROSCOPY Bilateral right 2006;  left 02-15-2007  . [place];  1990;  1983  . ROBOT ASSISTED LAPAROSCOPIC RADICAL PROSTATECTOMY  06/20/2006   bilateral nerve sparing  . TOTAL HIP ARTHROPLASTY Left 03/03/2015   Procedure: LEFT TOTAL HIP ARTHROPLASTY ANTERIOR APPROACH;  Surgeon: Dorna Leitz, MD;  Location: [facility];  Service: Orthopedics;  Laterality: Left;  . TOTAL KNEE ARTHROPLASTY Bilateral left 08-13-2009;  right 12-26-2009  . TRANSURETHRAL RESECTION OF BLADDER TUMOR N/A 09/20/2016   Procedure: TRANSURETHRAL RESECTION OF BLADDER TUMOR (TURBT);  Surgeon: Franchot Gallo, MD;  Location: [facility];  Service: Urology;  Laterality: N/A;    Social History:  reports that he quit smoking about 33 years ago. His smoking use included cigarettes. He  quit after 16.00 years of use. He has never used smokeless tobacco. He reports that he drinks alcohol. He reports that he does not use drugs.  Allergies: No Known Allergies   (Not in a hospital admission)   Physical Exam: Blood pressure 118/71, pulse 82, temperature 98.9 F (37.2 C), temperature source Oral, resp. rate 18, height '6\' 1"'  (1.854 m), weight 121.6 kg, SpO2 95 %. General: pleasant, WD, WN white male who is laying in bed in NAD HEENT: head is normocephalic, atraumatic.  Sclera are noninjected.  PERRL.  Ears and nose without any masses or lesions.  Mouth is pink and moist Heart: regular, rate, and  rhythm.  Normal s1,s2. No obvious murmurs, gallops, or rubs noted.  Palpable radial and pedal pulses bilaterally Lungs: CTAB, no wheezes, rhonchi, or rales noted.  Respiratory effort nonlabored Abd: soft, minimally tender in LLQ, but no guarding or peritoneal signs, ND, +BS, no masses, hernias, or organomegaly MS: all 4 extremities are symmetrical with no cyanosis, clubbing, or edema. Skin: warm and dry with no masses, lesions, or rashes Psych: A&Ox3 with an appropriate affect.   Results for orders placed or performed during the hospital encounter of 12/19/17 (from the past 48 hour(s))  CBC     Status: Abnormal   Collection Time: 12/19/17 12:56 PM  Result Value Ref Range   WBC 9.7 4.0 - 10.5 K/uL   RBC 3.02 (L) 4.22 - 5.81 MIL/uL   Hemoglobin 8.6 (L) 13.0 - 17.0 g/dL   HCT 26.7 (L) 39.0 - 52.0 %   MCV 88.4 78.0 - 100.0 fL   MCH 28.5 26.0 - 34.0 pg   MCHC 32.2 30.0 - 36.0 g/dL   RDW 17.5 (H) 11.5 - 15.5 %   Platelets 101 (L) 150 - 400 K/uL    Comment: REPEATED TO VERIFY SPECIMEN CHECKED FOR CLOTS PLATELET COUNT CONFIRMED BY SMEAR Performed at [facility] [address], [place], [place] [address]   Basic metabolic panel     Status: Abnormal   Collection Time: 12/19/17 12:56 PM  Result Value Ref Range   Sodium 138 135 - 145 mmol/L   Potassium 3.1 (L) 3.5 - 5.1 mmol/L   Chloride 93 (L) 98 - 111 mmol/L   CO2 33 (H) 22 - 32 mmol/L   Glucose, Bld 113 (H) 70 - 99 mg/dL   BUN 18 8 - 23 mg/dL   Creatinine, Ser 1.04 0.61 - 1.24 mg/dL   Calcium 8.4 (L) 8.9 - 10.3 mg/dL   GFR calc non Af Amer >60 >60 mL/min   GFR calc Af Amer >60 >60 mL/min    Comment: (NOTE) The eGFR has been calculated using the CKD EPI equation. This calculation has not been validated in all clinical situations. eGFR's persistently <60 mL/min signify possible Chronic Kidney Disease.    Anion gap 12 5 - 15    Comment: Performed at [facility], [place] [address],  [place], [place] [address]   Nm Pet Image Restag (ps) Skull Base To Thigh  Result Date: 12/19/2017 CLINICAL DATA:  Subsequent treatment strategy for bladder cancer. EXAM: NUCLEAR MEDICINE PET SKULL BASE TO THIGH TECHNIQUE: 13.6 mCi F-18 FDG was injected intravenously. Full-ring PET imaging was performed from the skull base to thigh after the radiotracer. CT data was obtained and used for attenuation correction and anatomic localization. Fasting blood glucose: 138 mg/dl COMPARISON:  10/27/2017 FINDINGS: Mediastinal blood pool activity: SUV max 2.5 NECK: Hypermetabolic left supraclavicular load seen on the previous  study has decreased in size measuring 4 mm today compared to 11 mm previously. No residual hypermetabolism in this lymph node on today's exam. No new cervical lymphadenopathy. Incidental CT findings: none CHEST: No hypermetabolic pulmonary nodule or mass. No hypermetabolic mediastinal or hilar lymphadenopathy. Incidental CT findings: No suspicious pulmonary nodule or mass. ABDOMEN/PELVIS: Interval development of a 10.8 x 7.1 cm fluid collection with air-fluid level in the central pelvis, tracking along and involving the sigmoid colon. Circumferential wall thickening is noted in the distal sigmoid colon just beyond this large fluid collection. The rim of the collection is hypermetabolic. There is an associated 2.3 cm focal gas collection in the root of the sigmoid mesocolon (4:67). No hypermetabolic soft tissue metastases evident in the abdomen. Hypermetabolic right pelvic lymphadenopathy on the prior study has decreased in the interval. 18 mm short axis external iliac lymph node measured previously has decreased to 12 mm short axis. SUV max = 2.4 on today's study compared to 19.3 previously. Other lymph nodes in the same region show similar interval decrease in size and hypermetabolism. Incidental CT findings: There is abdominal aortic atherosclerosis without aneurysm. Beam hardening artifact from left hip  replacement obscures inferior pelvis. Calcification associated with the right bladder wall is similar to prior. SKELETON: Diffuse FDG accumulation in the marrow space is new in the interval and presumably reflects stimulatory effects of therapy. Incidental CT findings: none IMPRESSION: 1. Interval development of a 10.8 x 7.1 cm collection of fluid and gas in the central pelvis, tracking along the sigmoid colon. Inflammatory changes track proximally in the sigmoid mesocolon where a discrete 2.3 cm gas collection is identified. Imaging features are compatible with pelvic abscess, potentially diverticular in origin. 2. Interval decrease in size and hypermetabolism of previously documented nodal disease in the left supraclavicular region and right anterior pelvic sidewall. 3. Diffuse marrow accumulation of FDG on the current study presumably related to stimulatory effects of therapy. 4.  Aortic Atherosclerois (ICD10-170.0) These results will be called to the ordering clinician or representative by the Radiologist Assistant, and communication documented in the PACS or zVision Dashboard. Electronically Signed   By: Misty Stanley M.D.   On: 12/19/2017 09:58      Assessment/Plan Intra-abdominal abscess The patient appears to have a large intra-abdominal fluid collection with some gas present suggesting an abscess.  He does have some colon with diverticula present, but has minimal pain and does not act like a classic case of diverticulitis with microperforation and abscess.  He was noted a year ago to have diverticula on his colonoscopy but has never had a case of diverticulitis.  He could have a chemo-induced perforation, pressure induced perforation secondary to laxative use, or bladder perforation.  Either way, the treatment for this collection is a percutaneous drainage catheter to be placed as he does not have an acute abdomen and does not need surgical intervention at this time.  If he is able to get his drain  placed today, then he could be started on a diet after.  Agree with abx therapy.  We will follow along with you.  HTN -per medicine Metastatic bladder cancer -per oncology/medicine FEN - NPO now for possible drain VTE - ok for chemical prophylaxis from our standpoint after drain placed ID - [place], [facility] 12/19/2017, 2:25 PM Pager: [phone]

## 2017-12-19 NOTE — ED Notes (Signed)
Transport called.

## 2017-12-19 NOTE — ED Notes (Signed)
General surgery at bedside. 

## 2017-12-19 NOTE — ED Notes (Signed)
ED TO INPATIENT HANDOFF REPORT  Name/Age/Gender Gaetana Michaelis 67 y.o. male  Code Status Code Status History    Date Active Date Inactive Code Status Order ID Comments User Context   10/17/2016 1231 10/21/2016 1911 Full Code [id number]  Lavina Hamman, MD Inpatient   09/20/2016 1009 09/21/2016 1229 Full Code [id number]  Franchot Gallo, MD Inpatient   03/03/2015 1929 03/05/2015 1428 Full Code [id number]  Gary Fleet, PA-C Inpatient    Advance Directive Documentation     Most Recent Value  Type of Advance Directive  Healthcare Power of Attorney, Living will  Pre-existing out of facility DNR order (yellow form or pink MOST form)  -  "MOST" Form in Place?  -      Home/SNF/Other Home  Chief Complaint ca pt/constipation  Level of Care/Admitting Diagnosis ED Disposition    ED Disposition Condition [place]: [facility] [100102]  Level of Care: Med-Surg [16]  Diagnosis: Abscess of abdominal cavity [facility]) [[id number]]  Admitting Physician: Alma Friendly [[id number]]  Attending Physician: Alma Friendly [[id number]]  PT Class (Do Not Modify): Observation [104]  PT Acc Code (Do Not Modify): Observation [10022]       Medical History Past Medical History:  Diagnosis Date  . Arthritis   . 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 ([place]) 09/29/2016  . Bladder tumor   . Goals of care, counseling/discussion 09/30/2016  . History of prostate cancer followed by pcp dr [name]-  per pt last PSA undetectable   dx 2008-- (Stage T1c, Gleason 3+3,  PSA 4.58, vol 99cc)  s/p  radical prostatectomy (nerve sparing bilateral)   . Hypertension   . Lower urinary tract symptoms (LUTS)   . Pre-diabetes   . Wears glasses     Allergies No Known Allergies  IV Location/Drains/Wounds Patient Lines/Drains/Airways Status   Active Line/Drains/Airways    Name:   Placement date:   Placement time:   Site:   Days:   Implanted Port 10/07/16  Right Chest   10/07/16    1036    Chest   438   Peripheral IV 09/20/16 Right;Anterior Hand   09/20/16    0654    Hand   455   Incision (Closed) 03/03/15 Hip Left   03/03/15    1337     1022   Incision (Closed) 09/20/16 Penis Other (Comment)   09/20/16    0721     455          Labs/Imaging Results for orders placed or performed during the hospital encounter of 12/19/17 (from the past 48 hour(s))  CBC     Status: Abnormal   Collection Time: 12/19/17 12:56 PM  Result Value Ref Range   WBC 9.7 4.0 - 10.5 K/uL   RBC 3.02 (L) 4.22 - 5.81 MIL/uL   Hemoglobin 8.6 (L) 13.0 - 17.0 g/dL   HCT 26.7 (L) 39.0 - 52.0 %   MCV 88.4 78.0 - 100.0 fL   MCH 28.5 26.0 - 34.0 pg   MCHC 32.2 30.0 - 36.0 g/dL   RDW 17.5 (H) 11.5 - 15.5 %   Platelets 101 (L) 150 - 400 K/uL    Comment: REPEATED TO VERIFY SPECIMEN CHECKED FOR CLOTS PLATELET COUNT CONFIRMED BY SMEAR Performed at [facility] [address], [place], [place] [address]   Basic metabolic panel     Status: Abnormal   Collection Time: 12/19/17 12:56 PM  Result Value Ref Range  Sodium 138 135 - 145 mmol/L   Potassium 3.1 (L) 3.5 - 5.1 mmol/L   Chloride 93 (L) 98 - 111 mmol/L   CO2 33 (H) 22 - 32 mmol/L   Glucose, Bld 113 (H) 70 - 99 mg/dL   BUN 18 8 - 23 mg/dL   Creatinine, Ser 1.04 0.61 - 1.24 mg/dL   Calcium 8.4 (L) 8.9 - 10.3 mg/dL   GFR calc non Af Amer >60 >60 mL/min   GFR calc Af Amer >60 >60 mL/min    Comment: (NOTE) The eGFR has been calculated using the CKD EPI equation. This calculation has not been validated in all clinical situations. eGFR's persistently <60 mL/min signify possible Chronic Kidney Disease.    Anion gap 12 5 - 15    Comment: Performed at [facility], [place] [address], [place], [place] [address]  Protime-INR     Status: None   Collection Time: 12/19/17 12:56 PM  Result Value Ref Range   Prothrombin Time 13.6 11.4 - 15.2 seconds   INR 1.05     Comment: Performed at  [facility], [place] [address] [place], [place] [address]   Nm Pet Image Restag (ps) Skull Base To Thigh  Result Date: 12/19/2017 CLINICAL DATA:  Subsequent treatment strategy for bladder cancer. EXAM: NUCLEAR MEDICINE PET SKULL BASE TO THIGH TECHNIQUE: 13.6 mCi F-18 FDG was injected intravenously. Full-ring PET imaging was performed from the skull base to thigh after the radiotracer. CT data was obtained and used for attenuation correction and anatomic localization. Fasting blood glucose: 138 mg/dl COMPARISON:  10/27/2017 FINDINGS: Mediastinal blood pool activity: SUV max 2.5 NECK: Hypermetabolic left supraclavicular load seen on the previous study has decreased in size measuring 4 mm today compared to 11 mm previously. No residual hypermetabolism in this lymph node on today's exam. No new cervical lymphadenopathy. Incidental CT findings: none CHEST: No hypermetabolic pulmonary nodule or mass. No hypermetabolic mediastinal or hilar lymphadenopathy. Incidental CT findings: No suspicious pulmonary nodule or mass. ABDOMEN/PELVIS: Interval development of a 10.8 x 7.1 cm fluid collection with air-fluid level in the central pelvis, tracking along and involving the sigmoid colon. Circumferential wall thickening is noted in the distal sigmoid colon just beyond this large fluid collection. The rim of the collection is hypermetabolic. There is an associated 2.3 cm focal gas collection in the root of the sigmoid mesocolon (4:67). No hypermetabolic soft tissue metastases evident in the abdomen. Hypermetabolic right pelvic lymphadenopathy on the prior study has decreased in the interval. 18 mm short axis external iliac lymph node measured previously has decreased to 12 mm short axis. SUV max = 2.4 on today's study compared to 19.3 previously. Other lymph nodes in the same region show similar interval decrease in size and hypermetabolism. Incidental CT findings: There is abdominal aortic atherosclerosis  without aneurysm. Beam hardening artifact from left hip replacement obscures inferior pelvis. Calcification associated with the right bladder wall is similar to prior. SKELETON: Diffuse FDG accumulation in the marrow space is new in the interval and presumably reflects stimulatory effects of therapy. Incidental CT findings: none IMPRESSION: 1. Interval development of a 10.8 x 7.1 cm collection of fluid and gas in the central pelvis, tracking along the sigmoid colon. Inflammatory changes track proximally in the sigmoid mesocolon where a discrete 2.3 cm gas collection is identified. Imaging features are compatible with pelvic abscess, potentially diverticular in origin. 2. Interval decrease in size and hypermetabolism of previously documented nodal disease in the left supraclavicular region  and right anterior pelvic sidewall. 3. Diffuse marrow accumulation of FDG on the current study presumably related to stimulatory effects of therapy. 4.  Aortic Atherosclerois (ICD10-170.0) These results will be called to the ordering clinician or representative by the Radiologist Assistant, and communication documented in the PACS or zVision Dashboard. Electronically Signed   By: Misty Stanley M.D.   On: 12/19/2017 09:58    Pending Labs Unresulted Labs (From admission, onward)    Start     Ordered   12/19/17 1219  Blood culture (routine x 2)  BLOOD CULTURE X 2,   STAT     12/19/17 1220          Vitals/Pain Today's Vitals   12/19/17 1105 12/19/17 1120 12/19/17 1304 12/19/17 1500  BP:   118/71 125/82  Pulse:   82 98  Resp:   18 19  Temp:      TempSrc:      SpO2:   95% 97%  Weight: 121.6 kg     Height: '6\' 1"'  (1.854 m)     PainSc:  2       Isolation Precautions No active isolations  Medications Medications  piperacillin-tazobactam (ZOSYN) IVPB 3.375 g (0 g Intravenous Stopped 12/19/17 1454)  sodium chloride 0.9 % bolus 1,000 mL (1,000 mLs Intravenous New Bag/Given 12/19/17 1307)    Mobility walks

## 2017-12-19 NOTE — Discharge Instructions (Signed)
Moderate Conscious Sedation, Adult, Care After  These instructions provide you with information about caring for yourself after your procedure. Your health care provider may also give you more specific instructions. Your treatment has been planned according to current medical practices, but problems sometimes occur. Call your health care provider if you have any problems or questions after your procedure.  What can I expect after the procedure?  After your procedure, it is common:   To feel sleepy for several hours.   To feel clumsy and have poor balance for several hours.   To have poor judgment for several hours.   To vomit if you eat too soon.    Follow these instructions at home:  For at least 24 hours after the procedure:     Do not:  ? Participate in activities where you could fall or become injured.  ? Drive.  ? Use heavy machinery.  ? Drink alcohol.  ? Take sleeping pills or medicines that cause drowsiness.  ? Make important decisions or sign legal documents.  ? Take care of children on your own.   Rest.  Eating and drinking   Follow the diet recommended by your health care provider.   If you vomit:  ? Drink water, juice, or soup when you can drink without vomiting.  ? Make sure you have little or no nausea before eating solid foods.  General instructions   Have a responsible adult stay with you until you are awake and alert.   Take over-the-counter and prescription medicines only as told by your health care provider.   If you smoke, do not smoke without supervision.   Keep all follow-up visits as told by your health care provider. This is important.  Contact a health care provider if:   You keep feeling nauseous or you keep vomiting.   You feel light-headed.   You develop a rash.   You have a fever.  Get help right away if:   You have trouble breathing.  This information is not intended to replace advice given to you by your health care provider. Make sure you discuss any questions you have  with your health care provider.  Document Released: 01/17/2013 Document Revised: 09/01/2015 Document Reviewed: 07/19/2015  Elsevier Interactive Patient Education  2018 [employer] Inc[employer]

## 2017-12-19 NOTE — ED Notes (Signed)
Hospitalist at bedside 

## 2017-12-19 NOTE — Procedures (Signed)
Interventional Radiology Procedure Note  Procedure: Placement of a 52F drain into the low anterior abdominal fluid and gas collection.  Aspiration yields 200 mL foul smelling thin brown fluid. Sample sent for culture.   Complications: None  Estimated Blood Loss: None  Recommendations: - Drain to JP Bulb - Cultures pending - Follow-up in drain clinic in 2 weeks with CT abd/pelvis with contrast and drain injection  Signed,  Criselda Peaches, MD

## 2017-12-19 NOTE — Progress Notes (Signed)
Pharmacy Antibiotic Note  [name] Rose is a 67 y.o. male with metastatic bladder cancer admitted on 12/19/2017 with intra-abdominal abscess. Outpatient PET scan today showed improvement in nodal disease however there was development of a fluid and gas collection in the central pelvis which tracked along the sigmoid colon. IR consulted for CT guided drain placement. Pharmacy has been consulted for Zosyn dosing.  Plan: Zosyn 3.375gm IV q8h (4hr extended infusions) No dose adjustments needed, Pharmacy will sign off  Height: $Remove'6\' 1"'lenIoHJ$  (185.4 cm) Weight: 268 lb (121.6 kg) IBW/kg (Calculated) : 79.9  Temp (24hrs), Avg:98.9 F (37.2 C), Min:98.9 F (37.2 C), Max:98.9 F (37.2 C)  Recent Labs  Lab 12/19/17 1256  WBC 9.7  CREATININE 1.04    Estimated Creatinine Clearance: 95.5 mL/min (by C-G formula based on SCr of 1.04 mg/dL).    No Known Allergies  Antimicrobials this admission: 9/9 Zosyn >>  Dose adjustments this admission: none  Microbiology results: 9/9 BCx: sent  Thank you for allowing pharmacy to be a part of this patient's care.  Peggyann Juba, PharmD, BCPS Pager: [phone] 12/19/2017 4:07 PM

## 2017-12-19 NOTE — H&P (Signed)
History and Physical  [name] HASKETT JOA:[id number] DOB: January 17, 1951 DOA: 12/19/2017   PCP: Shirline Frees, MD  Patient coming from: Home  Chief Complaint: Abscess collection  HPI: [name] Rose is a 67 y.o. male with medical history significant for metastatic bladder CA, history of prostate CA, hypertension, presents to the ER after a pelvic abscess was noted on a routine PET/CT done for follow-up of his bladder cancer.  Patient reported some vague lower abdominal pain that has been ongoing for the past week.  Patient related to lower abdominal pain to constipation of which he took mag citrate without any significant results.  Patient denies any fever/chills, nausea/vomiting, night sweats, chest pain, shortness of breath, diarrhea.  PET/CT showed interval development of a 10.8 x 7.1 cm collection of fluid and gas in the central pelvis, tracking along the sigmoid colon, compatible with pelvic abscess, potentially diverticular in origin.  Due to the findings, patient was sent to the ER.  ED Course: Vital signs remained stable, afebrile, no leukocytosis.  EDP consulted general surgery who states that is a poor surgical candidate, recommended IR.  IR consulted  Review of Systems: Review of systems are otherwise negative   Past Medical History:  Diagnosis Date  . Arthritis   . 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 ([place]) 09/29/2016  . Bladder tumor   . Goals of care, counseling/discussion 09/30/2016  . History of prostate cancer followed by pcp dr [name] Rose-  per pt last PSA undetectable   dx 2008-- (Stage T1c, Gleason 3+3,  PSA 4.58, vol 99cc)  s/p  radical prostatectomy (nerve sparing bilateral)   . Hypertension   . Lower urinary tract symptoms (LUTS)   . Pre-diabetes   . Wears glasses    Past Surgical History:  Procedure Laterality Date  . CATARACT EXTRACTION W/ INTRAOCULAR LENS  IMPLANT, BILATERAL Bilateral 2011  . [place]  . IR FLUORO GUIDE  PORT INSERTION RIGHT  10/07/2016  . IR US GUIDE VASC ACCESS RIGHT  10/07/2016  . KNEE ARTHROSCOPY Bilateral right 2006;  left 02-15-2007  . [place];  1990;  1983  . ROBOT ASSISTED LAPAROSCOPIC RADICAL PROSTATECTOMY  06/20/2006   bilateral nerve sparing  . TOTAL HIP ARTHROPLASTY Left 03/03/2015   Procedure: LEFT TOTAL HIP ARTHROPLASTY ANTERIOR APPROACH;  Surgeon: Dorna Leitz, MD;  Location: [facility];  Service: Orthopedics;  Laterality: Left;  . TOTAL KNEE ARTHROPLASTY Bilateral left 08-13-2009;  right 12-26-2009  . TRANSURETHRAL RESECTION OF BLADDER TUMOR N/A 09/20/2016   Procedure: TRANSURETHRAL RESECTION OF BLADDER TUMOR (TURBT);  Surgeon: Franchot Gallo, MD;  Location: [facility];  Service: Urology;  Laterality: N/A;    Social History:  reports that he quit smoking about 33 years ago. His smoking use included cigarettes. He quit after 16.00 years of use. He has never used smokeless tobacco. He reports that he drinks alcohol. He reports that he does not use drugs.   No Known Allergies  Family History  Problem Relation Age of Onset  . Hypertension Mother   . Aneurysm Mother   . Emphysema Father   . Hypertension Father      Prior to Admission medications   Medication Sig Start Date End Date Taking? Authorizing Provider  amLODipine (NORVASC) 10 MG tablet Take 10 mg by mouth daily.   Yes [provider]  bisoprolol-hydrochlorothiazide (ZIAC) 10-6.25 MG tablet Take 1 tablet by mouth daily.   Yes [provider]  Cinnamon 500 MG TABS  Take 1 tablet by mouth 2 (two) times daily.    Yes [provider]  dexamethasone (DECADRON) 4 MG tablet Take 2 tablets by mouth once a day on the day after chemotherapy and then take 2 tablets two times a day for 2 days. Take with food. 11/10/17  Yes [name] Napoleon, MD  gabapentin (NEURONTIN) 100 MG capsule Take 3 capsules (300 mg total) by mouth 2 (two) times daily. 09/20/17  Yes [name] Napoleon, MD   lidocaine-prilocaine (EMLA) cream Apply to affected area once 11/10/17  Yes Ennever, Rudell Cobb, MD  LORazepam (ATIVAN) 0.5 MG tablet Take 1 tablet (0.5 mg total) by mouth every 6 (six) hours as needed (Nausea or vomiting). 11/10/17  Yes [name] Napoleon, MD  losartan (COZAAR) 100 MG tablet Take 100 mg by mouth daily.   Yes [provider]  MAGNESIUM PO Take 400 mg by mouth at bedtime. Leg cramps   Yes [provider]  Naproxen Sodium (ALEVE) 220 MG CAPS Take 220 mg by mouth 2 (two) times daily.    Yes [provider]  ondansetron (ZOFRAN) 8 MG tablet Take 1 tablet (8 mg total) by mouth 2 (two) times daily as needed. Start on the third day after chemotherapy. 11/10/17  Yes [name] Napoleon, MD  prochlorperazine (COMPAZINE) 10 MG tablet Take 1 tablet (10 mg total) by mouth every 6 (six) hours as needed (Nausea or vomiting). 11/10/17  Yes [name] Napoleon, MD  Pyridoxine HCl (VITAMIN B-6) 250 MG tablet Take 1 tablet (250 mg total) by mouth daily. Patient taking differently: Take 300 mg by mouth daily.  05/24/17  Yes [name] Napoleon, MD  magic mouthwash w/lidocaine SOLN Take 5 mLs by mouth 4 (four) times daily. Patient not taking: Reported on 09/20/2017 08/10/17   [name] Napoleon, MD    Physical Exam: BP 104/60 (BP Location: Right Arm)   Pulse 71   Temp 98.9 F (37.2 C) (Oral)   Resp 19   Ht $R'6\' 1"'Gt$  (1.854 m)   Wt 121.6 kg   SpO2 100%   BMI 35.36 kg/m   General: NAD Eyes: Normal ENT: Normal Neck: Supple Cardiovascular: S1, S2 present Respiratory: CTA B Abdomen: Soft, nontender, nondistended, bowel sounds present Skin: Normal Musculoskeletal: No pedal edema bilaterally Psychiatric: Normal mood Neurologic: No focal neurologic deficits noted          Labs on Admission:  Basic Metabolic Panel: Recent Labs  Lab 12/19/17 1256  NA 138  K 3.1*  CL 93*  CO2 33*  GLUCOSE 113*  BUN 18  CREATININE 1.04  CALCIUM 8.4*   Liver Function Tests: No results for input(s):  AST, ALT, ALKPHOS, BILITOT, PROT, ALBUMIN in the last 168 hours. No results for input(s): LIPASE, AMYLASE in the last 168 hours. No results for input(s): AMMONIA in the last 168 hours. CBC: Recent Labs  Lab 12/19/17 1256  WBC 9.7  HGB 8.6*  HCT 26.7*  MCV 88.4  PLT 101*   Cardiac Enzymes: No results for input(s): CKTOTAL, CKMB, CKMBINDEX, TROPONINI in the last 168 hours.  BNP (last 3 results) No results for input(s): BNP in the last 8760 hours.  ProBNP (last 3 results) No results for input(s): PROBNP in the last 8760 hours.  CBG: Recent Labs  Lab 12/19/17 0746  GLUCAP 138*    Radiological Exams on Admission: Nm Pet Image Restag (ps) Skull Base To Thigh  Result Date: 12/19/2017 CLINICAL DATA:  Subsequent treatment strategy for bladder cancer. EXAM: NUCLEAR  MEDICINE PET SKULL BASE TO THIGH TECHNIQUE: 13.6 mCi F-18 FDG was injected intravenously. Full-ring PET imaging was performed from the skull base to thigh after the radiotracer. CT data was obtained and used for attenuation correction and anatomic localization. Fasting blood glucose: 138 mg/dl COMPARISON:  10/27/2017 FINDINGS: Mediastinal blood pool activity: SUV max 2.5 NECK: Hypermetabolic left supraclavicular load seen on the previous study has decreased in size measuring 4 mm today compared to 11 mm previously. No residual hypermetabolism in this lymph node on today's exam. No new cervical lymphadenopathy. Incidental CT findings: none CHEST: No hypermetabolic pulmonary nodule or mass. No hypermetabolic mediastinal or hilar lymphadenopathy. Incidental CT findings: No suspicious pulmonary nodule or mass. ABDOMEN/PELVIS: Interval development of a 10.8 x 7.1 cm fluid collection with air-fluid level in the central pelvis, tracking along and involving the sigmoid colon. Circumferential wall thickening is noted in the distal sigmoid colon just beyond this large fluid collection. The rim of the collection is hypermetabolic. There is an  associated 2.3 cm focal gas collection in the root of the sigmoid mesocolon (4:67). No hypermetabolic soft tissue metastases evident in the abdomen. Hypermetabolic right pelvic lymphadenopathy on the prior study has decreased in the interval. 18 mm short axis external iliac lymph node measured previously has decreased to 12 mm short axis. SUV max = 2.4 on today's study compared to 19.3 previously. Other lymph nodes in the same region show similar interval decrease in size and hypermetabolism. Incidental CT findings: There is abdominal aortic atherosclerosis without aneurysm. Beam hardening artifact from left hip replacement obscures inferior pelvis. Calcification associated with the right bladder wall is similar to prior. SKELETON: Diffuse FDG accumulation in the marrow space is new in the interval and presumably reflects stimulatory effects of therapy. Incidental CT findings: none IMPRESSION: 1. Interval development of a 10.8 x 7.1 cm collection of fluid and gas in the central pelvis, tracking along the sigmoid colon. Inflammatory changes track proximally in the sigmoid mesocolon where a discrete 2.3 cm gas collection is identified. Imaging features are compatible with pelvic abscess, potentially diverticular in origin. 2. Interval decrease in size and hypermetabolism of previously documented nodal disease in the left supraclavicular region and right anterior pelvic sidewall. 3. Diffuse marrow accumulation of FDG on the current study presumably related to stimulatory effects of therapy. 4.  Aortic Atherosclerois (ICD10-170.0) These results will be called to the ordering clinician or representative by the Radiologist Assistant, and communication documented in the PACS or zVision Dashboard. Electronically Signed   By: Misty Stanley M.D.   On: 12/19/2017 09:58    EKG: None  Assessment/Plan Present on Admission: . Abscess of abdominal cavity ([place]) . 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  ([place]) . 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 S/P IR CT-guided drainage on 12/19/17 Afebrile, no leukocytosis Incidental finding on PET/CT which showed interval development of a 10.8 x 7.1 cm collection of fluid and gas in the central pelvis, tracking along the sigmoid colon, compatible with pelvic abscess, potentially diverticular in origin [facility] X2 pending IR on board, placement of a 12 F drain, aspiration yields 200 mils of foul-smelling thin brown fluid Aerobic/anaerobic culture pending Continue IV Zosyn Continue IV fluids  Hypertension BP soft Hold all home BP meds Continue IV fluids  Hypokalemia Replace PRN  Anemia of chronic disease Likely due to malignancy,  baseline between 9-10 Daily CBC  Recurrent metastatic bladder CA to intra-abdominal lymph nodes On chemotherapy, followed by [name]    DVT prophylaxis: Lovenox  Code Status: DNR  Family Communication: Wife at bedside  Disposition Plan: Home  Consults called: General surgery, IR  Admission status: Observation    Alma Friendly MD [facility]   If 7PM-7AM, please contact night-coverage [email or web address]   12/19/2017, 6:20 PM

## 2017-12-19 NOTE — ED Triage Notes (Signed)
Patient is currently receiving chemo and last treatment was 2 weeks ago. patient c/o constipation. patient states he has not had a normal BM in a week. Patient did report a small BM this AM.

## 2017-12-19 NOTE — Telephone Encounter (Signed)
Received a call report from [facility] of patient's PET Scan. There is an area of concern for pelvic abscess.  Gave results to Dr Marin Olp. He would like me to call patient, let him know the scan shows that the cancer is improved, however there is a place of concern that needs urgent workup. He needs to go to the [facility] ED for workup and possible surgical eval.  Patient is aware of recommendation. He will go to the ED at [facility].

## 2017-12-19 NOTE — ED Provider Notes (Signed)
[facility] DEPT Provider Note   CSN: [id number] Arrival date & time: 12/19/17  1044     History   Chief Complaint Chief Complaint  Patient presents with  . cancer patient  . Constipation    HPI [name] Rose is a 67 y.o. male.  HPI 67 year old male with a history of metastatic bladder cancer currently receiving chemotherapy sent to the emergency department by his primary care physician after finding an intra-abdominal abscess on CT PET scan today.  Patient has been having lower abdominal discomfort and decreased bowel movement over the past 2 weeks.  No fevers at home.  No chills.  Patient does have a history of diverticular disease but is never had diverticulitis.  He does report having some mild lower abdominal pain 2 weeks ago.  His pain is only mild in severity at this time.  He does not want any medication for pain.  He underwent routine PET scan today which found a 10 x 7 cm intra-abdominal/pelvic abscess which likely has been leading to some of his constipation-like symptoms.   Past Medical History:  Diagnosis Date  . Arthritis   . 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 ([place]) 09/29/2016  . Bladder tumor   . Goals of care, counseling/discussion 09/30/2016  . History of prostate cancer followed by pcp dr [name]-  per pt last PSA undetectable   dx 2008-- (Stage T1c, Gleason 3+3,  PSA 4.58, vol 99cc)  s/p  radical prostatectomy (nerve sparing bilateral)   . Hypertension   . Lower urinary tract symptoms (LUTS)   . Pre-diabetes   . Wears glasses     Patient Active Problem List   Diagnosis Date Noted  . IDA (iron deficiency anemia) 09/08/2017  . 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 Anorexia 10/17/2016  . Diarrhea 10/17/2016  . LFT elevation 10/17/2016  . Dehydration 10/17/2016  . Fatigue 10/17/2016  . Hypocalcemia 10/17/2016  . Goals of care, counseling/discussion  09/30/2016  . 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 ([place]) 09/29/2016  . Cancer of dome of urinary bladder ([place]) 09/20/2016  . Postoperative anemia due to acute blood loss 03/04/2015  . Hypokalemia 03/04/2015  . 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 CATARACT EXTRACTION W/ INTRAOCULAR LENS  IMPLANT, BILATERAL Bilateral 2011  . [place]  . IR FLUORO GUIDE PORT INSERTION RIGHT  10/07/2016  . IR US GUIDE VASC ACCESS RIGHT  10/07/2016  . KNEE ARTHROSCOPY Bilateral right 2006;  left 02-15-2007  . [place];  1990;  1983  . ROBOT ASSISTED LAPAROSCOPIC RADICAL PROSTATECTOMY  06/20/2006   bilateral nerve sparing  . TOTAL HIP ARTHROPLASTY Left 03/03/2015   Procedure: LEFT TOTAL HIP ARTHROPLASTY ANTERIOR APPROACH;  Surgeon: Dorna Leitz, MD;  Location: [facility];  Service: Orthopedics;  Laterality: Left;  . TOTAL KNEE ARTHROPLASTY Bilateral left 08-13-2009;  right 12-26-2009  . TRANSURETHRAL RESECTION OF BLADDER TUMOR N/A 09/20/2016   Procedure: TRANSURETHRAL RESECTION OF BLADDER TUMOR (TURBT);  Surgeon: Franchot Gallo, MD;  Location: [facility];  Service: Urology;  Laterality: N/A;        Home Medications    Prior to Admission medications   Medication Sig Start Date End Date Taking? Authorizing Provider  Cinnamon 500 MG TABS Take 1 tablet by mouth 2 (two) times daily.    Yes [provider]  dexamethasone (  DECADRON) 4 MG tablet Take 2 tablets by mouth once a day on the day after chemotherapy and then take 2 tablets two times a day for 2 days. Take with food. 11/10/17  Yes Volanda Napoleon, MD  gabapentin (NEURONTIN) 100 MG capsule Take 3 capsules (300 mg total) by mouth 2 (two) times daily. 09/20/17  Yes Volanda Napoleon, MD  lidocaine-prilocaine (EMLA) cream Apply to affected area once 11/10/17  Yes Ennever, Rudell Cobb, MD  LORazepam (ATIVAN) 0.5 MG tablet  Take 1 tablet (0.5 mg total) by mouth every 6 (six) hours as needed (Nausea or vomiting). 11/10/17  Yes Volanda Napoleon, MD  MAGNESIUM PO Take 400 mg by mouth at bedtime. Leg cramps   Yes [provider]  Naproxen Sodium (ALEVE) 220 MG CAPS Take 220 mg by mouth 2 (two) times daily.    Yes [provider]  ondansetron (ZOFRAN) 8 MG tablet Take 1 tablet (8 mg total) by mouth 2 (two) times daily as needed. Start on the third day after chemotherapy. 11/10/17  Yes Volanda Napoleon, MD  prochlorperazine (COMPAZINE) 10 MG tablet Take 1 tablet (10 mg total) by mouth every 6 (six) hours as needed (Nausea or vomiting). 11/10/17  Yes Volanda Napoleon, MD  Pyridoxine HCl (VITAMIN B-6) 250 MG tablet Take 1 tablet (250 mg total) by mouth daily. Patient taking differently: Take 300 mg by mouth daily.  05/24/17  Yes Volanda Napoleon, MD  amLODipine (NORVASC) 10 MG tablet Take 10 mg by mouth daily.    [provider]  bisoprolol-hydrochlorothiazide (ZIAC) 10-6.25 MG tablet Take 1 tablet by mouth daily.    [provider]  losartan (COZAAR) 100 MG tablet Take 100 mg by mouth daily.    [provider]  magic mouthwash w/lidocaine SOLN Take 5 mLs by mouth 4 (four) times daily. Patient not taking: Reported on 09/20/2017 08/10/17   Volanda Napoleon, MD    Family History Family History  Problem Relation Age of Onset  . Hypertension Mother   . Aneurysm Mother   . Emphysema Father   . Hypertension Father     Social History Social History   Tobacco Use  . Smoking status: Former Smoker    Years: 16.00    Types: Cigarettes    Last attempt to quit: 09/25/1984    Years since quitting: 33.2  . Smokeless tobacco: Never Used  Substance Use Topics  . Alcohol use: Yes    Comment: occasionally  . Drug use: No     Allergies   Patient has no known allergies.   Review of Systems Review of Systems  All other systems reviewed and are negative.    Physical Exam Updated  Vital Signs BP 118/71   Pulse 82   Temp 98.9 F (37.2 C) (Oral)   Resp 18   Ht $R'6\' 1"'uu$  (1.854 m)   Wt 121.6 kg   SpO2 95%   BMI 35.36 kg/m   Physical Exam  Constitutional: He is oriented to person, place, and time. He appears well-developed and well-nourished.  HENT:  Head: Normocephalic and atraumatic.  Eyes: EOM are normal.  Neck: Normal range of motion.  Cardiovascular: Normal rate, regular rhythm and normal heart sounds.  Pulmonary/Chest: Effort normal and breath sounds normal. No respiratory distress.  Abdominal: Soft. He exhibits no distension.  Mild lower abdominal tenderness.  No guarding or rebound.  No peritoneal signs.  Musculoskeletal: Normal range of motion.  Neurological: He is alert and oriented to  person, place, and time.  Skin: Skin is warm and dry.  Psychiatric: He has a normal mood and affect. Judgment normal.  Nursing note and vitals reviewed.    ED Treatments / Results  Labs (all labs ordered are listed, but only abnormal results are displayed) Labs Reviewed  CBC - Abnormal; Notable for the following components:      Result Value   RBC 3.02 (*)    Hemoglobin 8.6 (*)    HCT 26.7 (*)    RDW 17.5 (*)    Platelets 101 (*)    All other components within normal limits  BASIC METABOLIC PANEL - Abnormal; Notable for the following components:   Potassium 3.1 (*)    Chloride 93 (*)    CO2 33 (*)    Glucose, Bld 113 (*)    Calcium 8.4 (*)    All other components within normal limits  CULTURE, BLOOD (ROUTINE X 2)  CULTURE, BLOOD (ROUTINE X 2)    EKG None  Radiology Nm Pet Image Restag (ps) Skull Base To Thigh  Result Date: 12/19/2017 CLINICAL DATA:  Subsequent treatment strategy for bladder cancer. EXAM: NUCLEAR MEDICINE PET SKULL BASE TO THIGH TECHNIQUE: 13.6 mCi F-18 FDG was injected intravenously. Full-ring PET imaging was performed from the skull base to thigh after the radiotracer. CT data was obtained and used for attenuation correction and  anatomic localization. Fasting blood glucose: 138 mg/dl COMPARISON:  10/27/2017 FINDINGS: Mediastinal blood pool activity: SUV max 2.5 NECK: Hypermetabolic left supraclavicular load seen on the previous study has decreased in size measuring 4 mm today compared to 11 mm previously. No residual hypermetabolism in this lymph node on today's exam. No new cervical lymphadenopathy. Incidental CT findings: none CHEST: No hypermetabolic pulmonary nodule or mass. No hypermetabolic mediastinal or hilar lymphadenopathy. Incidental CT findings: No suspicious pulmonary nodule or mass. ABDOMEN/PELVIS: Interval development of a 10.8 x 7.1 cm fluid collection with air-fluid level in the central pelvis, tracking along and involving the sigmoid colon. Circumferential wall thickening is noted in the distal sigmoid colon just beyond this large fluid collection. The rim of the collection is hypermetabolic. There is an associated 2.3 cm focal gas collection in the root of the sigmoid mesocolon (4:67). No hypermetabolic soft tissue metastases evident in the abdomen. Hypermetabolic right pelvic lymphadenopathy on the prior study has decreased in the interval. 18 mm short axis external iliac lymph node measured previously has decreased to 12 mm short axis. SUV max = 2.4 on today's study compared to 19.3 previously. Other lymph nodes in the same region show similar interval decrease in size and hypermetabolism. Incidental CT findings: There is abdominal aortic atherosclerosis without aneurysm. Beam hardening artifact from left hip replacement obscures inferior pelvis. Calcification associated with the right bladder wall is similar to prior. SKELETON: Diffuse FDG accumulation in the marrow space is new in the interval and presumably reflects stimulatory effects of therapy. Incidental CT findings: none IMPRESSION: 1. Interval development of a 10.8 x 7.1 cm collection of fluid and gas in the central pelvis, tracking along the sigmoid colon.  Inflammatory changes track proximally in the sigmoid mesocolon where a discrete 2.3 cm gas collection is identified. Imaging features are compatible with pelvic abscess, potentially diverticular in origin. 2. Interval decrease in size and hypermetabolism of previously documented nodal disease in the left supraclavicular region and right anterior pelvic sidewall. 3. Diffuse marrow accumulation of FDG on the current study presumably related to stimulatory effects of therapy. 4.  Aortic Atherosclerois (ICD10-170.0) These  results will be called to the ordering clinician or representative by the Radiologist Assistant, and communication documented in the PACS or zVision Dashboard. Electronically Signed   By: Misty Stanley M.D.   On: 12/19/2017 09:58    Procedures Procedures (including critical care time)  Medications Ordered in ED Medications  sodium chloride 0.9 % bolus 1,000 mL (1,000 mLs Intravenous New Bag/Given 12/19/17 1307)  piperacillin-tazobactam (ZOSYN) IVPB 3.375 g (3.375 g Intravenous New Bag/Given 12/19/17 1307)     Initial Impression / Assessment and Plan / ED Course  I have reviewed the triage vital signs and the nursing notes.  Pertinent labs & imaging results that were available during my care of the patient were reviewed by me and considered in my medical decision making (see chart for details).    Nontoxic-appearing.  Intra-abdominal abscess.  Likely diverticular in origin.  IV Zosyn now.  Given his active cancer with chemotherapy this puts him at risk given his immunocompromise state.  Patient will be admitted to the hospital.  Likely will benefit from IR consultation for possible percutaneous drainage.  Will discuss case with general surgery as well.  Patient to be admitted to the [facility].   2:20 PM Spoke with general surgery who will see in consultation\  Consultants: [facility] -Interventional  Radiology  Dispo: Admit  Final Clinical Impressions(s) / ED Diagnoses   Final diagnoses:  Pelvic abscess in male [facility])    ED Discharge Orders    None       Jola Schmidt, MD 12/19/17 1421

## 2017-12-20 DIAGNOSIS — K651 Peritoneal abscess: Secondary | ICD-10-CM

## 2017-12-20 DIAGNOSIS — Z79899 Other long term (current) drug therapy: Secondary | ICD-10-CM

## 2017-12-20 DIAGNOSIS — A419 Sepsis, unspecified organism: Secondary | ICD-10-CM | POA: Diagnosis present

## 2017-12-20 DIAGNOSIS — D696 Thrombocytopenia, unspecified: Secondary | ICD-10-CM | POA: Diagnosis present

## 2017-12-20 DIAGNOSIS — C679 Malignant neoplasm of bladder, unspecified: Secondary | ICD-10-CM

## 2017-12-20 DIAGNOSIS — M129 Arthropathy, unspecified: Secondary | ICD-10-CM

## 2017-12-20 DIAGNOSIS — B9620 Unspecified Escherichia coli [E. coli] as the cause of diseases classified elsewhere: Secondary | ICD-10-CM | POA: Diagnosis present

## 2017-12-20 DIAGNOSIS — I10 Essential (primary) hypertension: Secondary | ICD-10-CM

## 2017-12-20 DIAGNOSIS — Z9079 Acquired absence of other genital organ(s): Secondary | ICD-10-CM | POA: Diagnosis not present

## 2017-12-20 DIAGNOSIS — C772 Secondary and unspecified malignant neoplasm of intra-abdominal lymph nodes: Secondary | ICD-10-CM

## 2017-12-20 DIAGNOSIS — M1990 Unspecified osteoarthritis, unspecified site: Secondary | ICD-10-CM | POA: Diagnosis present

## 2017-12-20 DIAGNOSIS — Z9841 Cataract extraction status, right eye: Secondary | ICD-10-CM | POA: Diagnosis not present

## 2017-12-20 DIAGNOSIS — K5900 Constipation, unspecified: Secondary | ICD-10-CM

## 2017-12-20 DIAGNOSIS — Z8546 Personal history of malignant neoplasm of prostate: Secondary | ICD-10-CM | POA: Diagnosis not present

## 2017-12-20 DIAGNOSIS — E876 Hypokalemia: Secondary | ICD-10-CM | POA: Diagnosis present

## 2017-12-20 DIAGNOSIS — Z87891 Personal history of nicotine dependence: Secondary | ICD-10-CM

## 2017-12-20 DIAGNOSIS — Z96642 Presence of left artificial hip joint: Secondary | ICD-10-CM | POA: Diagnosis present

## 2017-12-20 DIAGNOSIS — R110 Nausea: Secondary | ICD-10-CM

## 2017-12-20 DIAGNOSIS — R7303 Prediabetes: Secondary | ICD-10-CM | POA: Diagnosis present

## 2017-12-20 DIAGNOSIS — Z8249 Family history of ischemic heart disease and other diseases of the circulatory system: Secondary | ICD-10-CM | POA: Diagnosis not present

## 2017-12-20 DIAGNOSIS — K5720 Diverticulitis of large intestine with perforation and abscess without bleeding: Secondary | ICD-10-CM | POA: Diagnosis present

## 2017-12-20 DIAGNOSIS — Z9221 Personal history of antineoplastic chemotherapy: Secondary | ICD-10-CM

## 2017-12-20 DIAGNOSIS — Z66 Do not resuscitate: Secondary | ICD-10-CM | POA: Diagnosis present

## 2017-12-20 DIAGNOSIS — Z9842 Cataract extraction status, left eye: Secondary | ICD-10-CM | POA: Diagnosis not present

## 2017-12-20 DIAGNOSIS — D630 Anemia in neoplastic disease: Secondary | ICD-10-CM | POA: Diagnosis present

## 2017-12-20 DIAGNOSIS — K567 Ileus, unspecified: Secondary | ICD-10-CM | POA: Diagnosis not present

## 2017-12-20 DIAGNOSIS — Z961 Presence of intraocular lens: Secondary | ICD-10-CM | POA: Diagnosis present

## 2017-12-20 DIAGNOSIS — D649 Anemia, unspecified: Secondary | ICD-10-CM

## 2017-12-20 LAB — IRON AND TIBC
Iron: 12 ug/dL — ABNORMAL LOW (ref 45–182)
Saturation Ratios: 8 % — ABNORMAL LOW (ref 17.9–39.5)
TIBC: 150 ug/dL — ABNORMAL LOW (ref 250–450)
UIBC: 138 ug/dL

## 2017-12-20 LAB — BASIC METABOLIC PANEL
Anion gap: 10 (ref 5–15)
BUN: 15 mg/dL (ref 8–23)
CO2: 32 mmol/L (ref 22–32)
Calcium: 8 mg/dL — ABNORMAL LOW (ref 8.9–10.3)
Chloride: 96 mmol/L — ABNORMAL LOW (ref 98–111)
Creatinine, Ser: 1.1 mg/dL (ref 0.61–1.24)
GFR calc Af Amer: >60 mL/min (ref >60)
GFR calc non Af Amer: >60 mL/min (ref >60)
Glucose, Bld: 186 mg/dL — ABNORMAL HIGH (ref 70–99)
Potassium: 3.1 mmol/L — ABNORMAL LOW (ref 3.5–5.1)
Sodium: 138 mmol/L (ref 135–145)

## 2017-12-20 LAB — CBC
HCT: 23.8 % — ABNORMAL LOW (ref 39.0–52.0)
Hemoglobin: 7.6 g/dL — ABNORMAL LOW (ref 13.0–17.0)
MCH: 27.8 pg (ref 26.0–34.0)
MCHC: 31.9 g/dL (ref 30.0–36.0)
MCV: 87.2 fL (ref 78.0–100.0)
Platelets: 110 10*3/uL — ABNORMAL LOW (ref 150–400)
RBC: 2.73 MIL/uL — ABNORMAL LOW (ref 4.22–5.81)
RDW: 17.9 % — ABNORMAL HIGH (ref 11.5–15.5)
WBC: 9.3 10*3/uL (ref 4.0–10.5)

## 2017-12-20 LAB — FERRITIN: Ferritin: 700 ng/mL — ABNORMAL HIGH (ref 24–336)

## 2017-12-20 LAB — MAGNESIUM: Magnesium: 1.5 mg/dL — ABNORMAL LOW (ref 1.7–2.4)

## 2017-12-20 MED ORDER — POTASSIUM CHLORIDE CRYS ER 20 MEQ PO TBCR
40.0000 meq | EXTENDED_RELEASE_TABLET | ORAL | Status: AC
  Administered 2017-12-20 (×2): 40 meq via ORAL
  Filled 2017-12-20 (×2): qty 2

## 2017-12-20 MED ORDER — SODIUM CHLORIDE 0.9 % IV SOLN
INTRAVENOUS | Status: DC | PRN
  Administered 2017-12-20 – 2017-12-21 (×2): via INTRAVENOUS

## 2017-12-20 NOTE — Consult Note (Signed)
Referral MD  Reason for Referral: Abdominal diverticular abscess; metastatic bladder cancer  Chief Complaint  Patient presents with  . cancer patient  . Constipation  : I could go to the bathroom and I was having vomiting.  HPI: [name] Rose is a very nice 67 year old white male.  He is well-known to me.  He has metastatic bladder cancer.  He is on third line therapy with [facility].  He has had a couple cycles.  He underwent a PET scan yesterday.  He was having some abdominal issues late last week.  He has had a hard time going to the bathroom.  He had vomiting over the weekend.  He had his PET scan yesterday.  This showed that he was responding.  His disease was less.  However, there is a large fluid collection in the pelvis.  It is felt that this was an abscess related to a sigmoid diverticulum.  He had no fever.  He had no bleeding.  He had no dysuria.  He was admitted.  He was not neutropenic.  His white cell count was 9.7.  Hemoglobin 8.6.  Platelet count 101.  His electrolytes looked okay.  No liver function tests were done.  Creatinine 1.04.  He was admitted.  Radiology saw him.  They put a drainage catheter in.  Approximately 20 cc of fluid was removed.  Cultures are pending.  Gram stain shows gram-negative rods and gram-positive cocci.  He currently is on Zosyn.  He feels a little bit better.  He has had no bleeding.  Again, he is no fever.  His appetite is better.  He started to eat a little bit more last night.  He is not having any nausea or vomiting.  Overall, his performance status is ECOG 1.    Past Medical History:  Diagnosis Date  . Arthritis   . 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 ([place]) 09/29/2016  . Bladder tumor   . Goals of care, counseling/discussion 09/30/2016  . History of prostate cancer followed by pcp dr [name]-  per pt last PSA undetectable   dx 2008-- (Stage T1c, Gleason 3+3,  PSA 4.58, vol 99cc)  s/p  radical prostatectomy (nerve  sparing bilateral)   . Hypertension   . Lower urinary tract symptoms (LUTS)   . Pre-diabetes   . Wears glasses   :  Past Surgical History:  Procedure Laterality Date  . CATARACT EXTRACTION W/ INTRAOCULAR LENS  IMPLANT, BILATERAL Bilateral 2011  . [place]  . IR FLUORO GUIDE PORT INSERTION RIGHT  10/07/2016  . IR US GUIDE VASC ACCESS RIGHT  10/07/2016  . KNEE ARTHROSCOPY Bilateral right 2006;  left 02-15-2007  . [place];  1990;  1983  . ROBOT ASSISTED LAPAROSCOPIC RADICAL PROSTATECTOMY  06/20/2006   bilateral nerve sparing  . TOTAL HIP ARTHROPLASTY Left 03/03/2015   Procedure: LEFT TOTAL HIP ARTHROPLASTY ANTERIOR APPROACH;  Surgeon: Dorna Leitz, MD;  Location: [facility];  Service: Orthopedics;  Laterality: Left;  . TOTAL KNEE ARTHROPLASTY Bilateral left 08-13-2009;  right 12-26-2009  . TRANSURETHRAL RESECTION OF BLADDER TUMOR N/A 09/20/2016   Procedure: TRANSURETHRAL RESECTION OF BLADDER TUMOR (TURBT);  Surgeon: Franchot Gallo, MD;  Location: [facility];  Service: Urology;  Laterality: N/A;  :   Current Facility-Administered Medications:  .  0.9 %  sodium chloride infusion, , Intravenous, Continuous, Horris Latino, Adline Peals, MD .  acetaminophen (TYLENOL) tablet 650 mg, 650 mg, Oral, Q6H PRN, 650 mg at  12/19/17 2150 **OR** acetaminophen (TYLENOL) suppository 650 mg, 650 mg, Rectal, Q6H PRN, Alma Friendly, MD .  albuterol (PROVENTIL) (2.5 MG/3ML) 0.083% nebulizer solution 2.5 mg, 2.5 mg, Nebulization, Q2H PRN, Alma Friendly, MD .  amLODipine (NORVASC) tablet 10 mg, 10 mg, Oral, Daily, Alma Friendly, MD .  enoxaparin (LOVENOX) injection 40 mg, 40 mg, Subcutaneous, Q24H, Alma Friendly, MD, 40 mg at 12/19/17 2151 .  gabapentin (NEURONTIN) capsule 300 mg, 300 mg, Oral, BID, Alma Friendly, MD, 300 mg at 12/19/17 2151 .  ondansetron (ZOFRAN) tablet 4 mg, 4 mg, Oral, Q6H PRN **OR** ondansetron (ZOFRAN) injection  4 mg, 4 mg, Intravenous, Q6H PRN, Alma Friendly, MD .  piperacillin-tazobactam (ZOSYN) IVPB 3.375 g, 3.375 g, Intravenous, Q8H, Emiliano Dyer, RPH, Last Rate: 12.5 mL/hr at 12/20/17 0451, 3.375 g at 12/20/17 0451 .  potassium chloride SA (K-DUR,KLOR-CON) CR tablet 40 mEq, 40 mEq, Oral, Once, Alma Friendly, MD .  senna-docusate (Senokot-S) tablet 1 tablet, 1 tablet, Oral, QHS PRN, Alma Friendly, MD .  sodium chloride flush (NS) 0.9 % injection 5 mL, 5 mL, Intracatheter, Q8H, Jacqulynn Cadet, MD .  traMADol (ULTRAM) tablet 50 mg, 50 mg, Oral, Q6H PRN, Alma Friendly, MD .  traZODone (DESYREL) tablet 25 mg, 25 mg, Oral, QHS PRN, Alma Friendly, MD  Facility-Administered Medications Ordered in Other Encounters:  .  sodium chloride flush (NS) 0.9 % injection 10 mL, 10 mL, Intravenous, PRN, [place], Sarah M, NP, 10 mL at 02/21/17 1038:  . amLODipine  10 mg Oral Daily  . enoxaparin (LOVENOX) injection  40 mg Subcutaneous Q24H  . gabapentin  300 mg Oral BID  . potassium chloride  40 mEq Oral Once  . sodium chloride flush  5 mL Intracatheter Q8H  :  No Known Allergies:  Family History  Problem Relation Age of Onset  . Hypertension Mother   . Aneurysm Mother   . Emphysema Father   . Hypertension Father   :  Social History   Socioeconomic History  . Marital status: Married    Spouse name: Not on file  . Number of children: Not on file  . Years of education: Not on file  . Highest education level: Not on file  Occupational History  . Not on file  Social Needs  . Financial resource strain: Not on file  . Food insecurity:    Worry: Not on file    Inability: Not on file  . Transportation needs:    Medical: Not on file    Non-medical: Not on file  Tobacco Use  . Smoking status: Former Smoker    Years: 16.00    Types: Cigarettes    Last attempt to quit: 09/25/1984    Years since quitting: 33.2  . Smokeless tobacco: Never Used  Substance  and Sexual Activity  . Alcohol use: Yes    Comment: occasionally  . Drug use: No  . Sexual activity: Not on file  Lifestyle  . Physical activity:    Days per week: Not on file    Minutes per session: Not on file  . Stress: Not on file  Relationships  . Social connections:    Talks on phone: Not on file    Gets together: Not on file    Attends religious service: Not on file    Active member of club or organization: Not on file    Attends meetings of clubs or organizations: Not on file  Relationship status: Not on file  . Intimate partner violence:    Fear of current or ex partner: Not on file    Emotionally abused: Not on file    Physically abused: Not on file    Forced sexual activity: Not on file  Other Topics Concern  . Not on file  Social History Narrative  . Not on file  :  Pertinent items are noted in HPI.  Exam: Patient Vitals for the past 24 hrs:  BP Temp Temp src Pulse Resp SpO2 Height Weight  12/20/17 0520 113/62 98.5 F (36.9 C) Oral 78 16 97 % - -  12/19/17 2339 - 99.2 F (37.3 C) Oral - - - - -  12/19/17 2255 - (!) 101.9 F (38.8 C) Oral - - - - -  12/19/17 2137 132/73 (!) 102.7 F (39.3 C) Oral (!) 103 20 96 % - -  12/19/17 1826 (!) 105/58 98.6 F (37 C) Oral 75 12 94 % - -  12/19/17 1741 104/60 - - 71 19 100 % - -  12/19/17 1735 (!) 102/57 - - 74 14 99 % - -  12/19/17 1730 (!) 108/58 - - 72 14 100 % - -  12/19/17 1725 (!) 109/55 - - 76 15 92 % - -  12/19/17 1710 116/75 - - 74 15 100 % - -  12/19/17 1500 125/82 - - 98 19 97 % - -  12/19/17 1304 118/71 - - 82 18 95 % - -  12/19/17 1105 - - - - - - $R'6\' 1"'br$  (1.854 m) 268 lb (121.6 kg)  12/19/17 1058 115/74 98.9 F (37.2 C) Oral 87 17 100 % - -     Recent Labs    12/19/17 1256  WBC 9.7  HGB 8.6*  HCT 26.7*  PLT 101*   Recent Labs    12/19/17 1256  NA 138  K 3.1*  CL 93*  CO2 33*  GLUCOSE 113*  BUN 18  CREATININE 1.04  CALCIUM 8.4*    Blood smear review: None  Pathology:  None    Assessment and Plan: [name] Rose is a 67 year old male with metastatic bladder cancer.  He is on third line therapy.  He is responding to this.  He now has a perforated diverticulum that has abscessed.  This is been drained.  Cultures are pending.  He is on IV antibiotics.  We will hold on any treatment for right now.  We have the letter that we can hold off on treatment as he is responding.  We will have to see what his cultures show.  He is somewhat anemic.  We will have to watch this closely.  We may end up having to transfuse him depending on his blood counts.  I am checking iron studies on him.  We will follow along and help out any way that we can.  I appreciate all the wonderful care that he is getting from all the staff up on [address].  [name], MD  [name] Rose 5:1

## 2017-12-20 NOTE — Progress Notes (Signed)
[facility] Progress Note     Subjective: CC:  Abdominal discomfort improved s/p draiange, still with mild LLQ tenderness to palpation. Tolerated regular dinner without nausea or vomiting. Reports small, loose BM today.  Objective: Vital signs in last 24 hours: Temp:  [98.5 F (36.9 C)-102.7 F (39.3 C)] 98.5 F (36.9 C) (09/10 0520) Pulse Rate:  [71-103] 78 (09/10 0520) Resp:  [12-20] 16 (09/10 0520) BP: (102-132)/(55-82) 113/62 (09/10 0520) SpO2:  [92 %-100 %] 97 % (09/10 0520) Weight:  [121.6 kg] 121.6 kg (09/09 1105) Last BM Date: 12/18/17  Intake/Output from previous day: 09/09 0701 - 09/10 0700 In: 1270 [P.O.:1200; IV Piggyback:50] Out: 272 [Drains:272] Intake/Output this shift: No intake/output data recorded.  PE: Gen:  Alert, NAD, pleasant Card:  Regular rate and rhythm, pedal pulses 2+ BL Pulm:  Normal effort, clear to auscultation bilaterally Abd: Soft, mild TTP LLQ/lower abdomen without peritonitis, drain with cloudy SS drainage, +BS Skin: warm and dry, no rashes  Psych: A&Ox3   Lab Results:  Recent Labs    12/19/17 1256  WBC 9.7  HGB 8.6*  HCT 26.7*  PLT 101*   BMET Recent Labs    12/19/17 1256  NA 138  K 3.1*  CL 93*  CO2 33*  GLUCOSE 113*  BUN 18  CREATININE 1.04  CALCIUM 8.4*   PT/INR Recent Labs    12/19/17 1256  LABPROT 13.6  INR 1.05   CMP     Component Value Date/Time   NA 138 12/19/2017 1256   NA 141 02/21/2017 0902   K 3.1 (L) 12/19/2017 1256   K 4.1 02/21/2017 0902   CL 93 (L) 12/19/2017 1256   CL 98 02/21/2017 0902   CO2 33 (H) 12/19/2017 1256   CO2 31 02/21/2017 0902   GLUCOSE 113 (H) 12/19/2017 1256   GLUCOSE 174 (H) 02/21/2017 0902   BUN 18 12/19/2017 1256   BUN 17 02/21/2017 0902   CREATININE 1.04 12/19/2017 1256   CREATININE 1.10 12/05/2017 0808   CREATININE 1.2 02/21/2017 0902   CALCIUM 8.4 (L) 12/19/2017 1256   CALCIUM 9.2 02/21/2017 0902   PROT 5.8 (L) 12/05/2017 0808   PROT 6.1 (L)  02/21/2017 0902   ALBUMIN 3.1 (L) 12/05/2017 0808   ALBUMIN 3.6 02/21/2017 0902   AST 32 12/05/2017 0808   ALT 23 12/05/2017 0808   ALT 18 02/21/2017 0902   ALKPHOS 60 12/05/2017 0808   ALKPHOS 65 02/21/2017 0902   BILITOT 0.5 12/05/2017 0808   GFRNONAA >60 12/19/2017 1256   GFRAA >60 12/19/2017 1256   Lipase     Component Value Date/Time   LIPASE 30 10/17/2016 1005       Studies/Results: Nm Pet Image Restag (ps) Skull Base To Thigh  Result Date: 12/19/2017 CLINICAL DATA:  Subsequent treatment strategy for bladder cancer. EXAM: NUCLEAR MEDICINE PET SKULL BASE TO THIGH TECHNIQUE: 13.6 mCi F-18 FDG was injected intravenously. Full-ring PET imaging was performed from the skull base to thigh after the radiotracer. CT data was obtained and used for attenuation correction and anatomic localization. Fasting blood glucose: 138 mg/dl COMPARISON:  10/27/2017 FINDINGS: Mediastinal blood pool activity: SUV max 2.5 NECK: Hypermetabolic left supraclavicular load seen on the previous study has decreased in size measuring 4 mm today compared to 11 mm previously. No residual hypermetabolism in this lymph node on today's exam. No new cervical lymphadenopathy. Incidental CT findings: none CHEST: No hypermetabolic pulmonary nodule or mass. No hypermetabolic mediastinal or hilar lymphadenopathy. Incidental CT findings: No  suspicious pulmonary nodule or mass. ABDOMEN/PELVIS: Interval development of a 10.8 x 7.1 cm fluid collection with air-fluid level in the central pelvis, tracking along and involving the sigmoid colon. Circumferential wall thickening is noted in the distal sigmoid colon just beyond this large fluid collection. The rim of the collection is hypermetabolic. There is an associated 2.3 cm focal gas collection in the root of the sigmoid mesocolon (4:67). No hypermetabolic soft tissue metastases evident in the abdomen. Hypermetabolic right pelvic lymphadenopathy on the prior study has decreased in the  interval. 18 mm short axis external iliac lymph node measured previously has decreased to 12 mm short axis. SUV max = 2.4 on today's study compared to 19.3 previously. Other lymph nodes in the same region show similar interval decrease in size and hypermetabolism. Incidental CT findings: There is abdominal aortic atherosclerosis without aneurysm. Beam hardening artifact from left hip replacement obscures inferior pelvis. Calcification associated with the right bladder wall is similar to prior. SKELETON: Diffuse FDG accumulation in the marrow space is new in the interval and presumably reflects stimulatory effects of therapy. Incidental CT findings: none IMPRESSION: 1. Interval development of a 10.8 x 7.1 cm collection of fluid and gas in the central pelvis, tracking along the sigmoid colon. Inflammatory changes track proximally in the sigmoid mesocolon where a discrete 2.3 cm gas collection is identified. Imaging features are compatible with pelvic abscess, potentially diverticular in origin. 2. Interval decrease in size and hypermetabolism of previously documented nodal disease in the left supraclavicular region and right anterior pelvic sidewall. 3. Diffuse marrow accumulation of FDG on the current study presumably related to stimulatory effects of therapy. 4.  Aortic Atherosclerois (ICD10-170.0) These results will be called to the ordering clinician or representative by the Radiologist Assistant, and communication documented in the PACS or zVision Dashboard. Electronically Signed   By: Misty Stanley M.D.   On: 12/19/2017 09:58    Anti-infectives: Anti-infectives (From admission, onward)   Start     Dose/Rate Route Frequency Ordered Stop   12/19/17 2000  piperacillin-tazobactam (ZOSYN) IVPB 3.375 g     3.375 g 12.5 mL/hr over 240 Minutes Intravenous Every 8 hours 12/19/17 1611     12/19/17 1230  piperacillin-tazobactam (ZOSYN) IVPB 3.375 g     3.375 g 100 mL/hr over 30 Minutes Intravenous  Once 12/19/17  1220 12/19/17 1454     Assessment/Plan HTN Anemia of chronic disease Metastatic bladder cancer on chemotherapy - followed by [name]   Intra-abdominal abscess  - unkown etiology; DDx: chemo-induced perforation, pressure-induced from severe constipation, diverticular - S/P IR percutaneous drainage 9/9 Dr. Laurence Ferrari - follow cultures and continue IV abx - clinically improving s/p drainage; RN to provide patient education regarding drain care today - regular diet - mobilize/IS  - will require outpatient follow up with IR in 2 weeks and Dr. Marcello Moores in 3-4 weeks.    LOS: 0 days    Obie Dredge, [facility] Surgery Pager: [phone]

## 2017-12-20 NOTE — Progress Notes (Signed)
PROGRESS NOTE    [name] Rose  WGN:[id number] DOB: 1950-10-19 DOA: 12/19/2017 PCP: Shirline Frees, MD   Brief Narrative:  HPI On 12/19/2017 by Dr. Lesia Sago  [name] Rose is a 67 y.o. male with medical history significant for metastatic bladder CA, history of prostate CA, hypertension, presents to the ER after a pelvic abscess was noted on a routine PET/CT done for follow-up of his bladder cancer.  Patient reported some vague lower abdominal pain that has been ongoing for the past week.  Patient related to lower abdominal pain to constipation of which he took mag citrate without any significant results.  Patient denies any fever/chills, nausea/vomiting, night sweats, chest pain, shortness of breath, diarrhea.  PET/CT showed interval development of a 10.8 x 7.1 cm collection of fluid and gas in the central pelvis, tracking along the sigmoid colon, compatible with pelvic abscess, potentially diverticular in origin.  Due to the findings, patient was sent to the ER.  Interim history Patient admitted for abscess, status post IR CT-guided drainage on 12/29/2017.  Patient was admitted and placed on IV Zosyn.  During the night, patient did develop fever of 102.7 F along with tachycardia.  Cultures currently pending.  Assessment & Plan   Sepsis secondary to -Patient was afebrile with no leukocytosis on admission however overnight patient developed fever of 102.7 F along with tachycardia -Status post IR percutaneous drainage on 9/9 by Dr. Laurence Ferrari -Dimensional radiology consulted and appreciated, patient to follow-up with the IR drain clinic in 2 weeks and repeat CT scan at that time.  Continue twice daily flushes with 3-5cc of normal saline. -Continue IV zosyn -General surgery consulted and appreciated.  She will need to follow-up with Dr. Marcello Moores in the office to discuss potential need for colectomy. -Blood cultures show no growth to date -Abscess culture pending, Gram stain shows  abundant GPC and moderate GNR  Essential hypertension -Stable, continue amlodipine  Hypokalemia -Continue to replace, obtain magnesium level and monitor CBC  Anemia of chronic disease -Hemoglobin currently 7.6 (dropped from 8.6 on admission)- Suspect dilutional component -Upon review of patient's chart, hemoglobin has ranged from 9-12 -Anemia panel pending -Continue to monitor CBC  Recurrent metastatic bladder cancer -Currently followed by [name], oncology.  DVT Prophylaxis  Lovenox  Code Status: DNR  Family Communication: Wife at bedside  Disposition Plan: Observation, pending culture. Suspect discharge to home in 24-48 hours  Consultants Interventional radiology General surgery Oncology  Procedures  CT image guided fluid drain   Antibiotics   Anti-infectives (From admission, onward)   Start     Dose/Rate Route Frequency Ordered Stop   12/19/17 2000  piperacillin-tazobactam (ZOSYN) IVPB 3.375 g     3.375 g 12.5 mL/hr over 240 Minutes Intravenous Every 8 hours 12/19/17 1611     12/19/17 1230  piperacillin-tazobactam (ZOSYN) IVPB 3.375 g     3.375 g 100 mL/hr over 30 Minutes Intravenous  Once 12/19/17 1220 12/19/17 1454      Subjective:   [name] Rose seen and examined today.  Continues to have some left lower quadrant tenderness.  Denies current chest pain, shortness breath, abdominal pain, nausea vomiting, diarrhea constipation.  Objective:   Vitals:   12/19/17 2255 12/19/17 2339 12/20/17 0520 12/20/17 1414  BP:   113/62 125/74  Pulse:   78 86  Resp:   16 18  Temp: (!) 101.9 F (38.8 C) 99.2 F (37.3 C) 98.5 F (36.9 C) 98.7 F (37.1 C)  TempSrc: Oral Oral Oral Oral  SpO2:   97% 97%  Weight:      Height:        Intake/Output Summary (Last 24 hours) at 12/20/2017 1456 Last data filed at 12/20/2017 1116 Gross per 24 hour  Intake 1720 ml  Output 272 ml  Net 1448 ml   Filed Weights   12/19/17 1105  Weight: 121.6 kg    Exam  General:  Well developed, well nourished, NAD, appears stated age  81: NCAT, mucous membranes moist.   Neck: Supple  Cardiovascular: S1 S2 auscultated, no rubs, murmurs or gallops. Regular rate and rhythm.  Respiratory: Clear to auscultation bilaterally with equal chest rise  Abdomen: Soft, mild LLQ TTP, nondistended, + bowel sounds, +drain  Extremities: warm dry without cyanosis clubbing. + LE edema  Neuro: AAOx3, nonfocal  Psych: Normal affect and demeanor    Data Reviewed: I have personally reviewed following labs and imaging studies  CBC: Recent Labs  Lab 12/19/17 1256 12/20/17 1040  WBC 9.7 9.3  HGB 8.6* 7.6*  HCT 26.7* 23.8*  MCV 88.4 87.2  PLT 101* 833*   Basic Metabolic Panel: Recent Labs  Lab 12/19/17 1256 12/20/17 1040  NA 138 138  K 3.1* 3.1*  CL 93* 96*  CO2 33* 32  GLUCOSE 113* 186*  BUN 18 15  CREATININE 1.04 1.10  CALCIUM 8.4* 8.0*   GFR: Estimated Creatinine Clearance: 90.3 mL/min (by C-G formula based on SCr of 1.1 mg/dL). Liver Function Tests: No results for input(s): AST, ALT, ALKPHOS, BILITOT, PROT, ALBUMIN in the last 168 hours. No results for input(s): LIPASE, AMYLASE in the last 168 hours. No results for input(s): AMMONIA in the last 168 hours. Coagulation Profile: Recent Labs  Lab 12/19/17 1256  INR 1.05   Cardiac Enzymes: No results for input(s): CKTOTAL, CKMB, CKMBINDEX, TROPONINI in the last 168 hours. BNP (last 3 results) No results for input(s): PROBNP in the last 8760 hours. HbA1C: No results for input(s): HGBA1C in the last 72 hours. CBG: Recent Labs  Lab 12/19/17 0746  GLUCAP 138*   Lipid Profile: No results for input(s): CHOL, HDL, LDLCALC, TRIG, CHOLHDL, LDLDIRECT in the last 72 hours. Thyroid Function Tests: No results for input(s): TSH, T4TOTAL, FREET4, T3FREE, THYROIDAB in the last 72 hours. Anemia Panel: Recent Labs    12/20/17 1040  FERRITIN 700*  TIBC 150*  IRON 12*   Urine analysis:    Component  Value Date/Time   COLORURINE YELLOW 05/14/2017 1952   APPEARANCEUR CLEAR 05/14/2017 1952   LABSPEC 1.020 05/14/2017 1952   PHURINE 7.0 05/14/2017 1952   GLUCOSEU NEGATIVE 05/14/2017 1952   HGBUR NEGATIVE 05/14/2017 1952   BILIRUBINUR NEGATIVE 05/14/2017 1952   KETONESUR NEGATIVE 05/14/2017 1952   PROTEINUR NEGATIVE 05/14/2017 1952   UROBILINOGEN 0.2 01/31/2010 0624   NITRITE NEGATIVE 05/14/2017 1952   LEUKOCYTESUR NEGATIVE 05/14/2017 1952   Sepsis Labs: $[email or web address]'[id number]$ (procalcitonin:4,lacticidven:4)  ) Recent Results (from the past 240 hour(s))  Blood culture (routine x 2)     Status: None (Preliminary result)   Collection Time: 12/19/17 12:45 PM  Result Value Ref Range Status   Specimen Description   Final    BLOOD PORTA CATH Performed at [facility], [place] [address], [place], [place] [address]    Special Requests   Final    BOTTLES DRAWN AEROBIC AND ANAEROBIC Blood Culture adequate volume Performed at [place] [address] [place], [place] [address]    Culture   Final    NO GROWTH <  24 HOURS Performed at [facility], [place] [address], [place], [place] [address]    Report Status PENDING  Incomplete  Blood culture (routine x 2)     Status: None (Preliminary result)   Collection Time: 12/19/17 12:55 PM  Result Value Ref Range Status   Specimen Description   Final    BLOOD LEFT HAND Performed at [place] [address], [place], [place] [address]    Special Requests   Final    BOTTLES DRAWN AEROBIC AND ANAEROBIC Blood Culture results may not be optimal due to an inadequate volume of blood received in culture bottles Performed at [place] [address] [place], [place] [address]    Culture   Final    NO GROWTH < 24 HOURS Performed at [place] [address] [place], [place] [address]    Report Status PENDING  Incomplete  Aerobic/Anaerobic Culture (surgical/deep  wound)     Status: None (Preliminary result)   Collection Time: 12/19/17  5:49 PM  Result Value Ref Range Status   Specimen Description   Final    ABSCESS DIVERTICULAR Performed at [facility] [address] [place], [place] [address]    Special Requests   Final    Immunocompromised Performed at [facility], [place] [address] [place], [place] [address]    Gram Stain   Final    ABUNDANT WBC PRESENT, PREDOMINANTLY PMN ABUNDANT GRAM POSITIVE COCCI MODERATE GRAM NEGATIVE RODS    Culture   Final    TOO YOUNG TO READ Performed at [facility], [place] [address] [place],  [address]    Report Status PENDING  Incomplete      Radiology Studies: Nm Pet Image Restag (ps) Skull Base To Thigh  Result Date: 12/19/2017 CLINICAL DATA:  Subsequent treatment strategy for bladder cancer. EXAM: NUCLEAR MEDICINE PET SKULL BASE TO THIGH TECHNIQUE: 13.6 mCi F-18 FDG was injected intravenously. Full-ring PET imaging was performed from the skull base to thigh after the radiotracer. CT data was obtained and used for attenuation correction and anatomic localization. Fasting blood glucose: 138 mg/dl COMPARISON:  10/27/2017 FINDINGS: Mediastinal blood pool activity: SUV max 2.5 NECK: Hypermetabolic left supraclavicular load seen on the previous study has decreased in size measuring 4 mm today compared to 11 mm previously. No residual hypermetabolism in this lymph node on today's exam. No new cervical lymphadenopathy. Incidental CT findings: none CHEST: No hypermetabolic pulmonary nodule or mass. No hypermetabolic mediastinal or hilar lymphadenopathy. Incidental CT findings: No suspicious pulmonary nodule or mass. ABDOMEN/PELVIS: Interval development of a 10.8 x 7.1 cm fluid collection with air-fluid level in the central pelvis, tracking along and involving the sigmoid colon. Circumferential wall thickening is noted in the distal sigmoid colon just beyond this large  fluid collection. The rim of the collection is hypermetabolic. There is an associated 2.3 cm focal gas collection in the root of the sigmoid mesocolon (4:67). No hypermetabolic soft tissue metastases evident in the abdomen. Hypermetabolic right pelvic lymphadenopathy on the prior study has decreased in the interval. 18 mm short axis external iliac lymph node measured previously has decreased to 12 mm short axis. SUV max = 2.4 on today's study compared to 19.3 previously. Other lymph nodes in the same region show similar interval decrease in size and hypermetabolism. Incidental CT findings: There is abdominal aortic atherosclerosis without aneurysm. Beam hardening artifact from left hip replacement obscures inferior pelvis. Calcification associated with  the right bladder wall is similar to prior. SKELETON: Diffuse FDG accumulation in the marrow space is new in the interval and presumably reflects stimulatory effects of therapy. Incidental CT findings: none IMPRESSION: 1. Interval development of a 10.8 x 7.1 cm collection of fluid and gas in the central pelvis, tracking along the sigmoid colon. Inflammatory changes track proximally in the sigmoid mesocolon where a discrete 2.3 cm gas collection is identified. Imaging features are compatible with pelvic abscess, potentially diverticular in origin. 2. Interval decrease in size and hypermetabolism of previously documented nodal disease in the left supraclavicular region and right anterior pelvic sidewall. 3. Diffuse marrow accumulation of FDG on the current study presumably related to stimulatory effects of therapy. 4.  Aortic Atherosclerois (ICD10-170.0) These results will be called to the ordering clinician or representative by the Radiologist Assistant, and communication documented in the PACS or zVision Dashboard. Electronically Signed   By: Misty Stanley M.D.   On: 12/19/2017 09:58   Ct Image Guided Drainage By Percutaneous Catheter  Result Date:  12/20/2017 INDICATION: 67 year old male with and unexpected intra-abdominal abscess identified on PET-CT earlier today. EXAM: CT GUIDED DRAINAGE OF  ABSCESS MEDICATIONS: The patient is currently admitted to the hospital and receiving intravenous antibiotics. The antibiotics were administered within an appropriate time frame prior to the initiation of the procedure. ANESTHESIA/SEDATION: 3 mg IV Versed 100 mcg IV Fentanyl Moderate Sedation Time:  30 minutes The patient was continuously monitored during the procedure by the interventional radiology nurse under my direct supervision. COMPLICATIONS: None immediate. TECHNIQUE: Informed written consent was obtained from the patient after a thorough discussion of the procedural risks, benefits and alternatives. All questions were addressed. Maximal Sterile Barrier Technique was utilized including caps, mask, sterile gowns, sterile gloves, sterile drape, hand hygiene and skin antiseptic. A timeout was performed prior to the initiation of the procedure. PROCEDURE: The operative field was prepped with Chlorhexidine in a sterile fashion, and a sterile drape was applied covering the operative field. A sterile gown and sterile gloves were used for the procedure. Local anesthesia was provided with 1% Lidocaine. A planning axial CT scan was performed. The fluid and gas collection in the low anterior abdomen was successfully identified. A suitable skin entry site was selected and marked. Local anesthesia was attained by infiltration with 1% lidocaine. A small dermatotomy was made. Under intermittent CT guidance, an 18 gauge trocar needle was carefully advanced through the anterior abdominal wall and into the fluid and gas collection. A wire was then advanced into the collection. The trocar needle was removed. The skin tract was dilated to 40 [place] and a Cook 12 [place] all-purpose drainage catheter was advanced over the wire and formed. Aspiration was then performed yielding  approximately 200 mL of foul-smelling thin brown fluid. A sample was collected and sent for Gram stain and culture. Post drain placement axial CT imaging was performed which demonstrates excellent drain placement and near-total interval resolution of the abscess cavity. The drainage catheter was then connected to JP bulb suction and secured to the skin with an 0 Prolene suture and an adhesive fixation device. The patient tolerated the procedure well. FINDINGS: Approximately 200 mL foul-smelling thin brown fluid. IMPRESSION: Successful placement of a 12 French drainage catheter. PLAN: 1. Maintain drain to JP bulb suction. 2. Cultures are pending. 3. Recommend flushing drain at least once per shift. 4. Follow-up in interventional radiology clinic in 2 weeks with repeat CT scan of the pelvis with intravenous contrast to evaluate for residual/recurrent  abscess and drain injection under fluoroscopy to evaluate for fistulous communication with the sigmoid colon or rectum. Signed, Criselda Peaches, MD, [facility] Radiology Electronically Signed   By: Jacqulynn Cadet M.D.   On: 12/20/2017 09:07     Scheduled Meds: . amLODipine  10 mg Oral Daily  . enoxaparin (LOVENOX) injection  40 mg Subcutaneous Q24H  . gabapentin  300 mg Oral BID  . potassium chloride  40 mEq Oral Once  . sodium chloride flush  5 mL Intracatheter Q8H   Continuous Infusions: . sodium chloride    . sodium chloride 10 mL/hr at 12/20/17 1250  . piperacillin-tazobactam (ZOSYN)  IV 3.375 g (12/20/17 1254)     LOS: 0 days   Time Spent in minutes   45 minutes  Khiry Pasquariello D.O. on 12/20/2017 at 2:56 PM  Between 7am to 7pm - Please see pager noted on [email or web address]  After 7pm go to [email or web address]  And look for the night coverage person covering for me after hours  [facility] Office  [phone]

## 2017-12-20 NOTE — Progress Notes (Signed)
Referring Physician(s): Dr. Lennette Bihari Campos/Dr. Burney Gauze  Supervising Physician: Aletta Edouard  Patient Status:  [facility] - In-pt  Chief Complaint: Follow-up pelvic abscess drain placement 9/9 by Dr. Laurence Ferrari  Subjective:  [name] Rose is a 67 y.o. male with a past medical history significant for HTN, history of prostate cancer s/p radical prostatectomy (2008) and metastatic bladder cancer for which he is followed by [name] and being treated with M-VAC chemotherapy. Patient received outpatient PET scan yesterday which showed improvement in nodal disease however there was development of a fluid and gas collection in the central pelvis which tracked along the sigmoid colon. He was sent to ED for further evaluation by [name] and consults were placed to both general surgery and IR for management. General surgery felt that there was no indication for surgical intervention at that time given patient was essentially asymptomatic. IR placed pelvic abscess drain yesterday without complications and fluid was sent for testing.  Patient reports he feels pretty good today aside from some abdominal discomfort with movement and one episode of diarrhea this AM. He reports good appetite, denies nausea, vomiting, chest pain, shortness of breath, fever or chills. He and his wife are hopeful he can return home tomorrow; he is also hopeful that he can play golf next week.  Allergies: Patient has no known allergies.  Medications: Prior to Admission medications   Medication Sig Start Date End Date Taking? Authorizing Provider  amLODipine (NORVASC) 10 MG tablet Take 10 mg by mouth daily.   Yes [provider]  bisoprolol-hydrochlorothiazide (ZIAC) 10-6.25 MG tablet Take 1 tablet by mouth daily.   Yes [provider]  Cinnamon 500 MG TABS Take 1 tablet by mouth 2 (two) times daily.    Yes [provider]  dexamethasone (DECADRON) 4 MG tablet Take 2 tablets by mouth once  a day on the day after chemotherapy and then take 2 tablets two times a day for 2 days. Take with food. 11/10/17  Yes Volanda Napoleon, MD  gabapentin (NEURONTIN) 100 MG capsule Take 3 capsules (300 mg total) by mouth 2 (two) times daily. 09/20/17  Yes Volanda Napoleon, MD  lidocaine-prilocaine (EMLA) cream Apply to affected area once 11/10/17  Yes Ennever, Rudell Cobb, MD  LORazepam (ATIVAN) 0.5 MG tablet Take 1 tablet (0.5 mg total) by mouth every 6 (six) hours as needed (Nausea or vomiting). 11/10/17  Yes Volanda Napoleon, MD  losartan (COZAAR) 100 MG tablet Take 100 mg by mouth daily.   Yes [provider]  MAGNESIUM PO Take 400 mg by mouth at bedtime. Leg cramps   Yes [provider]  Naproxen Sodium (ALEVE) 220 MG CAPS Take 220 mg by mouth 2 (two) times daily.    Yes [provider]  ondansetron (ZOFRAN) 8 MG tablet Take 1 tablet (8 mg total) by mouth 2 (two) times daily as needed. Start on the third day after chemotherapy. 11/10/17  Yes Volanda Napoleon, MD  prochlorperazine (COMPAZINE) 10 MG tablet Take 1 tablet (10 mg total) by mouth every 6 (six) hours as needed (Nausea or vomiting). 11/10/17  Yes Volanda Napoleon, MD  Pyridoxine HCl (VITAMIN B-6) 250 MG tablet Take 1 tablet (250 mg total) by mouth daily. Patient taking differently: Take 300 mg by mouth daily.  05/24/17  Yes Volanda Napoleon, MD  magic mouthwash w/lidocaine SOLN Take 5 mLs by mouth 4 (four) times daily. Patient not taking: Reported on 09/20/2017 08/10/17   Ennever,  Rudell Cobb, MD     Vital Signs: BP 113/62 (BP Location: Left Arm)   Pulse 78   Temp 98.5 F (36.9 C) (Oral)   Resp 16   Ht $R'6\' 1"'NP$  (1.854 m)   Wt 268 lb (121.6 kg)   SpO2 97%   BMI 35.36 kg/m   Physical Exam  Constitutional: No distress.  HENT:  Head: Normocephalic.  Cardiovascular: Normal rate, regular rhythm and normal heart sounds.  Pulmonary/Chest: Effort normal and breath sounds normal.  Abdominal: Soft. He exhibits no distension. There  is tenderness (mild; over drain insertion site.).  Cloudy serosanguineous drainage in JP; flushes/aspirates easily. Insertion site clean, dry and intact without discharge or erythema.  Neurological: He is alert.  Skin: Skin is warm and dry. He is not diaphoretic.  Psychiatric: He has a normal mood and affect. His behavior is normal.  Nursing note and vitals reviewed.   Imaging: Nm Pet Image Restag (ps) Skull Base To Thigh  Result Date: 12/19/2017 CLINICAL DATA:  Subsequent treatment strategy for bladder cancer. EXAM: NUCLEAR MEDICINE PET SKULL BASE TO THIGH TECHNIQUE: 13.6 mCi F-18 FDG was injected intravenously. Full-ring PET imaging was performed from the skull base to thigh after the radiotracer. CT data was obtained and used for attenuation correction and anatomic localization. Fasting blood glucose: 138 mg/dl COMPARISON:  10/27/2017 FINDINGS: Mediastinal blood pool activity: SUV max 2.5 NECK: Hypermetabolic left supraclavicular load seen on the previous study has decreased in size measuring 4 mm today compared to 11 mm previously. No residual hypermetabolism in this lymph node on today's exam. No new cervical lymphadenopathy. Incidental CT findings: none CHEST: No hypermetabolic pulmonary nodule or mass. No hypermetabolic mediastinal or hilar lymphadenopathy. Incidental CT findings: No suspicious pulmonary nodule or mass. ABDOMEN/PELVIS: Interval development of a 10.8 x 7.1 cm fluid collection with air-fluid level in the central pelvis, tracking along and involving the sigmoid colon. Circumferential wall thickening is noted in the distal sigmoid colon just beyond this large fluid collection. The rim of the collection is hypermetabolic. There is an associated 2.3 cm focal gas collection in the root of the sigmoid mesocolon (4:67). No hypermetabolic soft tissue metastases evident in the abdomen. Hypermetabolic right pelvic lymphadenopathy on the prior study has decreased in the interval. 18 mm short  axis external iliac lymph node measured previously has decreased to 12 mm short axis. SUV max = 2.4 on today's study compared to 19.3 previously. Other lymph nodes in the same region show similar interval decrease in size and hypermetabolism. Incidental CT findings: There is abdominal aortic atherosclerosis without aneurysm. Beam hardening artifact from left hip replacement obscures inferior pelvis. Calcification associated with the right bladder wall is similar to prior. SKELETON: Diffuse FDG accumulation in the marrow space is new in the interval and presumably reflects stimulatory effects of therapy. Incidental CT findings: none IMPRESSION: 1. Interval development of a 10.8 x 7.1 cm collection of fluid and gas in the central pelvis, tracking along the sigmoid colon. Inflammatory changes track proximally in the sigmoid mesocolon where a discrete 2.3 cm gas collection is identified. Imaging features are compatible with pelvic abscess, potentially diverticular in origin. 2. Interval decrease in size and hypermetabolism of previously documented nodal disease in the left supraclavicular region and right anterior pelvic sidewall. 3. Diffuse marrow accumulation of FDG on the current study presumably related to stimulatory effects of therapy. 4.  Aortic Atherosclerois (ICD10-170.0) These results will be called to the ordering clinician or representative by the Radiologist Assistant, and communication documented  in the PACS or zVision Dashboard. Electronically Signed   By: Misty Stanley M.D.   On: 12/19/2017 09:58   Ct Image Guided Drainage By Percutaneous Catheter  Result Date: 12/20/2017 INDICATION: 67 year old male with and unexpected intra-abdominal abscess identified on PET-CT earlier today. EXAM: CT GUIDED DRAINAGE OF  ABSCESS MEDICATIONS: The patient is currently admitted to the hospital and receiving intravenous antibiotics. The antibiotics were administered within an appropriate time frame prior to the  initiation of the procedure. ANESTHESIA/SEDATION: 3 mg IV Versed 100 mcg IV Fentanyl Moderate Sedation Time:  30 minutes The patient was continuously monitored during the procedure by the interventional radiology nurse under my direct supervision. COMPLICATIONS: None immediate. TECHNIQUE: Informed written consent was obtained from the patient after a thorough discussion of the procedural risks, benefits and alternatives. All questions were addressed. Maximal Sterile Barrier Technique was utilized including caps, mask, sterile gowns, sterile gloves, sterile drape, hand hygiene and skin antiseptic. A timeout was performed prior to the initiation of the procedure. PROCEDURE: The operative field was prepped with Chlorhexidine in a sterile fashion, and a sterile drape was applied covering the operative field. A sterile gown and sterile gloves were used for the procedure. Local anesthesia was provided with 1% Lidocaine. A planning axial CT scan was performed. The fluid and gas collection in the low anterior abdomen was successfully identified. A suitable skin entry site was selected and marked. Local anesthesia was attained by infiltration with 1% lidocaine. A small dermatotomy was made. Under intermittent CT guidance, an 18 gauge trocar needle was carefully advanced through the anterior abdominal wall and into the fluid and gas collection. A wire was then advanced into the collection. The trocar needle was removed. The skin tract was dilated to 62 [place] and a Cook 12 [place] all-purpose drainage catheter was advanced over the wire and formed. Aspiration was then performed yielding approximately 200 mL of foul-smelling thin brown fluid. A sample was collected and sent for Gram stain and culture. Post drain placement axial CT imaging was performed which demonstrates excellent drain placement and near-total interval resolution of the abscess cavity. The drainage catheter was then connected to JP bulb suction and secured to  the skin with an 0 Prolene suture and an adhesive fixation device. The patient tolerated the procedure well. FINDINGS: Approximately 200 mL foul-smelling thin brown fluid. IMPRESSION: Successful placement of a 12 French drainage catheter. PLAN: 1. Maintain drain to JP bulb suction. 2. Cultures are pending. 3. Recommend flushing drain at least once per shift. 4. Follow-up in interventional radiology clinic in 2 weeks with repeat CT scan of the pelvis with intravenous contrast to evaluate for residual/recurrent abscess and drain injection under fluoroscopy to evaluate for fistulous communication with the sigmoid colon or rectum. Signed, Criselda Peaches, MD, [facility] Radiology Electronically Signed   By: Jacqulynn Cadet M.D.   On: 12/20/2017 09:07    Labs:  CBC: Recent Labs    10/10/17 0845 11/10/17 0830 12/05/17 0808 12/19/17 1256  WBC 13.2* 6.0 7.7 9.7  HGB 9.9* 11.2* 9.5* 8.6*  HCT 31.3* 36.3* 30.3* 26.7*  PLT 215 186 321 101*    COAGS: Recent Labs    04/21/17 0729 12/19/17 1256  INR 0.99 1.05    BMP: Recent Labs    04/21/17 0729  05/14/17 1952  10/10/17 0845 11/10/17 0830 12/05/17 0808 12/19/17 1256  NA 139   < > 135   < > 142 142 141 138  K 4.0   < >  4.2   < > 4.0 3.7 3.7 3.1*  CL 103   < > 97*   < > 102 102 99 93*  CO2 28   < > 28   < > $R'27 31 30 'By$ 33*  GLUCOSE 116*   < > 112*   < > 204* 159* 181* 113*  BUN 30*   < > 21*   < > $R'22 19 20 18  'Arnold$ CALCIUM 8.9   < > 9.0   < > 9.5 9.5 8.7 8.4*  CREATININE 1.21   < > 1.33*   < > 0.90 1.20 1.10 1.04  GFRNONAA >60  --  55*  --   --   --   --  >60  GFRAA >60  --  >60  --   --   --   --  >60   < > = values in this interval not displayed.    LIVER FUNCTION TESTS: Recent Labs    09/20/17 0836 10/10/17 0845 11/10/17 0830 12/05/17 0808  BILITOT 0.7 0.7 0.7 0.5  AST 34 32 35 32  ALT 32 $Remo'25 21 23  'DZSoC$ ALKPHOS 67 56 68 60  PROT 6.2* 6.3* 6.2* 5.8*  ALBUMIN 3.0* 3.4* 3.3*  3.1*    Assessment and Plan:  Pelvic abscess of unknown etiology s/p percutaneous drain placed in IR yesterday by Dr. Laurence Ferrari. Culture pending from fluid, gram stain shows abundant gram positive cocci and moderate gram negative rods - continues in IV Zosyn currently. Patient remains afebrile, WBC pending from today. Abscess drain flushes/aspirates easily, 272 cc output recorded yesterday.   IR to continue to follow while inpatient, will follow up in IR drain clinic in about 2 weeks for repeat CT. Continue BID flushes with 3-5 cc NS, record output from drain. D/c per admitting service.   Electronically Signed: Joaquim Nam, PA-C  12/20/2017, 9:50 AM   I spent a total of 25 Minutes at the the patient's bedside AND on the patient's hospital floor or unit, greater than 50% of which was counseling/coordinating care for pelvic abscess drain.

## 2017-12-21 ENCOUNTER — Inpatient Hospital Stay (HOSPITAL_COMMUNITY): Payer: PPO

## 2017-12-21 LAB — CBC WITH DIFFERENTIAL/PLATELET
Basophils Absolute: 0 10*3/uL (ref 0.0–0.1)
Basophils Relative: 0 %
Eosinophils Absolute: 0.1 10*3/uL (ref 0.0–0.7)
Eosinophils Relative: 1 %
HCT: 23.5 % — ABNORMAL LOW (ref 39.0–52.0)
Hemoglobin: 7.6 g/dL — ABNORMAL LOW (ref 13.0–17.0)
Lymphocytes Relative: 14 %
Lymphs Abs: 1.3 10*3/uL (ref 0.7–4.0)
MCH: 28.6 pg (ref 26.0–34.0)
MCHC: 32.3 g/dL (ref 30.0–36.0)
MCV: 88.3 fL (ref 78.0–100.0)
Monocytes Absolute: 0.6 10*3/uL (ref 0.1–1.0)
Monocytes Relative: 6 %
Neutro Abs: 7.2 10*3/uL (ref 1.7–7.7)
Neutrophils Relative %: 79 %
Platelets: 127 10*3/uL — ABNORMAL LOW (ref 150–400)
RBC: 2.66 MIL/uL — ABNORMAL LOW (ref 4.22–5.81)
RDW: 17.6 % — ABNORMAL HIGH (ref 11.5–15.5)
WBC: 9.2 10*3/uL (ref 4.0–10.5)

## 2017-12-21 LAB — COMPREHENSIVE METABOLIC PANEL
ALT: 13 U/L (ref 0–44)
AST: 16 U/L (ref 15–41)
Albumin: 2.2 g/dL — ABNORMAL LOW (ref 3.5–5.0)
Alkaline Phosphatase: 48 U/L (ref 38–126)
Anion gap: 9 (ref 5–15)
BUN: 11 mg/dL (ref 8–23)
CO2: 32 mmol/L (ref 22–32)
Calcium: 8.1 mg/dL — ABNORMAL LOW (ref 8.9–10.3)
Chloride: 100 mmol/L (ref 98–111)
Creatinine, Ser: 0.95 mg/dL (ref 0.61–1.24)
GFR calc Af Amer: >60 mL/min (ref >60)
GFR calc non Af Amer: >60 mL/min (ref >60)
Glucose, Bld: 108 mg/dL — ABNORMAL HIGH (ref 70–99)
Potassium: 3.6 mmol/L (ref 3.5–5.1)
Sodium: 141 mmol/L (ref 135–145)
Total Bilirubin: 0.7 mg/dL (ref 0.3–1.2)
Total Protein: 4.8 g/dL — ABNORMAL LOW (ref 6.5–8.1)

## 2017-12-21 LAB — HIV ANTIBODY (ROUTINE TESTING W REFLEX): HIV Screen 4th Generation wRfx: NONREACTIVE

## 2017-12-21 MED ORDER — SENNOSIDES-DOCUSATE SODIUM 8.6-50 MG PO TABS
1.0000 | ORAL_TABLET | Freq: Two times a day (BID) | ORAL | Status: DC
  Administered 2017-12-21 – 2017-12-22 (×4): 1 via ORAL
  Filled 2017-12-21 (×4): qty 1

## 2017-12-21 MED ORDER — POLYETHYLENE GLYCOL 3350 17 G PO PACK: 17.0000 g | PACK | Freq: Every day | ORAL | Status: DC

## 2017-12-21 MED ORDER — SODIUM CHLORIDE 0.9 % IV SOLN
510.0000 mg | Freq: Once | INTRAVENOUS | Status: AC
  Administered 2017-12-21: 510 mg via INTRAVENOUS
  Filled 2017-12-21: qty 17

## 2017-12-21 MED ORDER — POLYETHYLENE GLYCOL 3350 17 G PO PACK
17.0000 g | PACK | Freq: Two times a day (BID) | ORAL | Status: DC
  Administered 2017-12-21 – 2017-12-22 (×3): 17 g via ORAL
  Filled 2017-12-21 (×3): qty 1

## 2017-12-21 MED ORDER — DOCUSATE SODIUM 100 MG PO CAPS: 100.0000 mg | ORAL_CAPSULE | Freq: Two times a day (BID) | ORAL | Status: DC

## 2017-12-21 NOTE — Care Management (Signed)
This CM spoke with pt and wife at bedside for DC planning. Wife states she doesn't think she will need a HHRN for pt drain. She states she can care for it herself. She will let the RN know if she changes her mind. Pt states he does not want PT eval, that he is walking around fine. RN made aware. Marney Doctor RN,BSN [phone]

## 2017-12-21 NOTE — Progress Notes (Signed)
Referring Physician(s): Progress  Supervising Physician: Jacqulynn Cadet  Patient Status:  [facility] - In-pt  Chief Complain: pelvic  abscess  Subjective: Pt still c/o constipation, occ nausea; some soreness at mid pelvic drain site; passing some gas   Allergies: Patient has no known allergies.  Medications: Prior to Admission medications   Medication Sig Start Date End Date Taking? Authorizing Provider  amLODipine (NORVASC) 10 MG tablet Take 10 mg by mouth daily.   Yes [provider]  bisoprolol-hydrochlorothiazide (ZIAC) 10-6.25 MG tablet Take 1 tablet by mouth daily.   Yes [provider]  Cinnamon 500 MG TABS Take 1 tablet by mouth 2 (two) times daily.    Yes [provider]  dexamethasone (DECADRON) 4 MG tablet Take 2 tablets by mouth once a day on the day after chemotherapy and then take 2 tablets two times a day for 2 days. Take with food. 11/10/17  Yes Volanda Napoleon, MD  gabapentin (NEURONTIN) 100 MG capsule Take 3 capsules (300 mg total) by mouth 2 (two) times daily. 09/20/17  Yes Volanda Napoleon, MD  lidocaine-prilocaine (EMLA) cream Apply to affected area once 11/10/17  Yes Ennever, Rudell Cobb, MD  LORazepam (ATIVAN) 0.5 MG tablet Take 1 tablet (0.5 mg total) by mouth every 6 (six) hours as needed (Nausea or vomiting). 11/10/17  Yes Volanda Napoleon, MD  losartan (COZAAR) 100 MG tablet Take 100 mg by mouth daily.   Yes [provider]  MAGNESIUM PO Take 400 mg by mouth at bedtime. Leg cramps   Yes [provider]  Naproxen Sodium (ALEVE) 220 MG CAPS Take 220 mg by mouth 2 (two) times daily.    Yes [provider]  ondansetron (ZOFRAN) 8 MG tablet Take 1 tablet (8 mg total) by mouth 2 (two) times daily as needed. Start on the third day after chemotherapy. 11/10/17  Yes Volanda Napoleon, MD  prochlorperazine (COMPAZINE) 10 MG tablet Take 1 tablet (10 mg total) by mouth every 6 (six) hours as needed (Nausea or vomiting). 11/10/17   Yes Volanda Napoleon, MD  Pyridoxine HCl (VITAMIN B-6) 250 MG tablet Take 1 tablet (250 mg total) by mouth daily. Patient taking differently: Take 300 mg by mouth daily.  05/24/17  Yes Volanda Napoleon, MD  magic mouthwash w/lidocaine SOLN Take 5 mLs by mouth 4 (four) times daily. Patient not taking: Reported on 09/20/2017 08/10/17   Volanda Napoleon, MD     Vital Signs: BP 129/75 (BP Location: Left Arm)   Pulse 80   Temp 98 F (36.7 C) (Oral)   Resp 16   Ht $R'6\' 1"'Uk$  (1.854 m)   Wt 268 lb (121.6 kg)   SpO2 96%   BMI 35.36 kg/m   Physical Exam pelvic drain intact, insertion site ok, mildly tender to palpation; output 30 cc turbid, blood-tinged fluid; drain irrigated without difficulty  Imaging: Nm Pet Image Restag (ps) Skull Base To Thigh  Result Date: 12/19/2017 CLINICAL DATA:  Subsequent treatment strategy for bladder cancer. EXAM: NUCLEAR MEDICINE PET SKULL BASE TO THIGH TECHNIQUE: 13.6 mCi F-18 FDG was injected intravenously. Full-ring PET imaging was performed from the skull base to thigh after the radiotracer. CT data was obtained and used for attenuation correction and anatomic localization. Fasting blood glucose: 138 mg/dl COMPARISON:  10/27/2017 FINDINGS: Mediastinal blood pool activity: SUV max 2.5 NECK: Hypermetabolic left supraclavicular load seen on the previous study has decreased in size measuring 4 mm today compared to 11 mm previously.  No residual hypermetabolism in this lymph node on today's exam. No new cervical lymphadenopathy. Incidental CT findings: none CHEST: No hypermetabolic pulmonary nodule or mass. No hypermetabolic mediastinal or hilar lymphadenopathy. Incidental CT findings: No suspicious pulmonary nodule or mass. ABDOMEN/PELVIS: Interval development of a 10.8 x 7.1 cm fluid collection with air-fluid level in the central pelvis, tracking along and involving the sigmoid colon. Circumferential wall thickening is noted in the distal sigmoid colon just beyond this large  fluid collection. The rim of the collection is hypermetabolic. There is an associated 2.3 cm focal gas collection in the root of the sigmoid mesocolon (4:67). No hypermetabolic soft tissue metastases evident in the abdomen. Hypermetabolic right pelvic lymphadenopathy on the prior study has decreased in the interval. 18 mm short axis external iliac lymph node measured previously has decreased to 12 mm short axis. SUV max = 2.4 on today's study compared to 19.3 previously. Other lymph nodes in the same region show similar interval decrease in size and hypermetabolism. Incidental CT findings: There is abdominal aortic atherosclerosis without aneurysm. Beam hardening artifact from left hip replacement obscures inferior pelvis. Calcification associated with the right bladder wall is similar to prior. SKELETON: Diffuse FDG accumulation in the marrow space is new in the interval and presumably reflects stimulatory effects of therapy. Incidental CT findings: none IMPRESSION: 1. Interval development of a 10.8 x 7.1 cm collection of fluid and gas in the central pelvis, tracking along the sigmoid colon. Inflammatory changes track proximally in the sigmoid mesocolon where a discrete 2.3 cm gas collection is identified. Imaging features are compatible with pelvic abscess, potentially diverticular in origin. 2. Interval decrease in size and hypermetabolism of previously documented nodal disease in the left supraclavicular region and right anterior pelvic sidewall. 3. Diffuse marrow accumulation of FDG on the current study presumably related to stimulatory effects of therapy. 4.  Aortic Atherosclerois (ICD10-170.0) These results will be called to the ordering clinician or representative by the Radiologist Assistant, and communication documented in the PACS or zVision Dashboard. Electronically Signed   By: Misty Stanley M.D.   On: 12/19/2017 09:58   Ct Image Guided Drainage By Percutaneous Catheter  Result Date:  12/20/2017 INDICATION: 67 year old [name] Rose with and unexpected intra-abdominal abscess identified on PET-CT earlier today. EXAM: CT GUIDED DRAINAGE OF  ABSCESS MEDICATIONS: The patient is currently admitted to the hospital and receiving intravenous antibiotics. The antibiotics were administered within an appropriate time frame prior to the initiation of the procedure. ANESTHESIA/SEDATION: 3 mg IV Versed 100 mcg IV Fentanyl Moderate Sedation Time:  30 minutes The patient was continuously monitored during the procedure by the interventional radiology nurse under my direct supervision. COMPLICATIONS: None immediate. TECHNIQUE: Informed written consent was obtained from the patient after a thorough discussion of the procedural risks, benefits and alternatives. All questions were addressed. Maximal Sterile Barrier Technique was utilized including caps, mask, sterile gowns, sterile gloves, sterile drape, hand hygiene and skin antiseptic. A timeout was performed prior to the initiation of the procedure. PROCEDURE: The operative field was prepped with Chlorhexidine in a sterile fashion, and a sterile drape was applied covering the operative field. A sterile gown and sterile gloves were used for the procedure. Local anesthesia was provided with 1% Lidocaine. A planning axial CT scan was performed. The fluid and gas collection in the low anterior abdomen was successfully identified. A suitable skin entry site was selected and marked. Local anesthesia was attained by infiltration with 1% lidocaine. A small dermatotomy was made. Under intermittent CT guidance,  an 18 gauge trocar needle was carefully advanced through the anterior abdominal wall and into the fluid and gas collection. A wire was then advanced into the collection. The trocar needle was removed. The skin tract was dilated to 45 [place] and a Cook 12 [place] all-purpose drainage catheter was advanced over the wire and formed. Aspiration was then performed yielding  approximately 200 mL of foul-smelling thin brown fluid. A sample was collected and sent for Gram stain and culture. Post drain placement axial CT imaging was performed which demonstrates excellent drain placement and near-total interval resolution of the abscess cavity. The drainage catheter was then connected to JP bulb suction and secured to the skin with an 0 Prolene suture and an adhesive fixation device. The patient tolerated the procedure well. FINDINGS: Approximately 200 mL foul-smelling thin brown fluid. IMPRESSION: Successful placement of a 12 French drainage catheter. PLAN: 1. Maintain drain to JP bulb suction. 2. Cultures are pending. 3. Recommend flushing drain at least once per shift. 4. Follow-up in interventional radiology clinic in 2 weeks with repeat CT scan of the pelvis with intravenous contrast to evaluate for residual/recurrent abscess and drain injection under fluoroscopy to evaluate for fistulous communication with the sigmoid colon or rectum. Signed, Criselda Peaches, MD, [facility] Radiology Electronically Signed   By: Jacqulynn Cadet M.D.   On: 12/20/2017 09:07    Labs:  CBC: Recent Labs    12/05/17 0808 12/19/17 1256 12/20/17 1040 12/21/17 0505  WBC 7.7 9.7 9.3 9.2  HGB 9.5* 8.6* 7.6* 7.6*  HCT 30.3* 26.7* 23.8* 23.5*  PLT 321 101* 110* 127*    COAGS: Recent Labs    04/21/17 0729 12/19/17 1256  INR 0.99 1.05    BMP: Recent Labs    05/14/17 1952  12/05/17 0808 12/19/17 1256 12/20/17 1040 12/21/17 0505  NA 135   < > 141 138 138 141  K 4.2   < > 3.7 3.1* 3.1* 3.6  CL 97*   < > 99 93* 96* 100  CO2 28   < > 30 33* 32 32  GLUCOSE 112*   < > 181* 113* 186* 108*  BUN 21*   < > $R'20 18 15 11  'Mj$ CALCIUM 9.0   < > 8.7 8.4* 8.0* 8.1*  CREATININE 1.33*   < > 1.10 1.04 1.10 0.95  GFRNONAA 55*  --   --  >60 >60 >60  GFRAA >60  --   --  >60 >60 >60   < > = values in this interval not displayed.    LIVER  FUNCTION TESTS: Recent Labs    10/10/17 0845 11/10/17 0830 12/05/17 0808 12/21/17 0505  BILITOT 0.7 0.7 0.5 0.7  AST 32 35 32 16  ALT $Re'25 21 23 13  'GUm$ ALKPHOS 56 68 60 48  PROT 6.3* 6.2* 5.8* 4.8*  ALBUMIN 3.4* 3.3* 3.1* 2.2*    Assessment and Plan: Pt s/p drainage of pelvic?diverticular abscess 9/9; afebrile; WBC nl; creat nl; hgb 7.6(7.6), drain fluid cx pend; ?need for miralax- defer to primary /CCS; if d/c'd home with drain rec once daily irrigation of drain with 5 cc sterile NS, output recording and dressing changes every 1-2 days; he will be set up for f/u CT with drain injection in 1-2 weeks at IR clinic; call [phone] or [phone] with any DRAIN questions   Electronically Signed: D. Rowe Robert, PA-C 12/21/2017, 9:44 AM   I spent a total of 20 minutes at the the  patient's bedside AND on the patient's hospital floor or unit, greater than 50% of which was counseling/coordinating care for pelvic abscess drain    Patient ID: [name] Rose, [name] Rose   DOB: [birth year], 67 y.o.   MRN: [id number]

## 2017-12-21 NOTE — Progress Notes (Signed)
Patient ID: [name] Rose, male   DOB: 10/11/1950, 67 y.o.   MRN: [id number]  PROGRESS NOTE    [name] Rose  LZJ:[id number] DOB: [birth year] DOA: 12/19/2017 PCP: Shirline Frees, MD   Brief Narrative:  67 year old male with history of metastatic bladder cancer, prostate cancer, hypertension presented on 12/19/2017 after pelvic abscess was noted on routine PET/CT done for follow-up of his bladder cancer.  Patient was started on intravenous antibiotics.  General surgery was consulted who recommended IR guided drainage.  Patient underwent IR guided drainage on 12/19/2017.   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  Currently hemodynamically stable.  Blood cultures negative so far.  Antibiotic plan as below  Pelvic abscess most likely secondary to colonic microperforation -Status post IR guided percutaneous drainage.  Fluid culture is pending.  Currently on Zosyn which will be continued. -Outpatient follow-up with general surgery for need for probable colectomy -Outpatient follow-up with IR with repeat CT scan in 1 to 2 weeks  Constipation -X-ray of abdomen is pending.  Will probably start on stool softeners along with MiraLAX.  Will avoid strong laxatives like magnesium citrate because of probable colonic microperforation  Essential hypertension -Stable.  Continue amlodipine  Anemia of chronic disease -Stable.  Monitor.  Transfuse if hemoglobin drops below 7  Recurrent metastatic bladder cancer -Currently followed by an [name]  Thrombocytopenia -Stable.  No signs of bleeding.  Monitor   DVT prophylaxis: Lovenox Code Status: Full Family Communication: Spoke to wife at bedside  disposition Plan: Home in 1 to 2 days if clinically improved  Consultants: General surgery/IR/oncology  Procedures:  IR guided percutaneous drainage of pelvic abscess  Antimicrobials: Zosyn from 12/19/2017 onwards   Subjective: Patient seen and examined at bedside.  She complains of constipation.  He is passing gas.  No current nausea or vomiting.  Has mild lower abdominal pain.  No overnight fever.  Objective: Vitals:   12/20/17 0520 12/20/17 1414 12/20/17 2155 12/21/17 0509  BP: 113/62 125/74 127/76 129/75  Pulse: 78 86 86 80  Resp: $Remo'16 18 20 16  'LPwVF$ Temp: 98.5 F (36.9 C) 98.7 F (37.1 C) 98.8 F (37.1 C) 98 F (36.7 C)  TempSrc: Oral Oral Oral Oral  SpO2: 97% 97% 92% 96%  Weight:      Height:        Intake/Output Summary (Last 24 hours) at 12/21/2017 1351 Last data filed at 12/21/2017 0400 Gross per 24 hour  Intake 1118.54 ml  Output 30 ml  Net 1088.54 ml   Filed Weights   12/19/17 1105  Weight: 121.6 kg    Examination:  General exam: Appears calm and comfortable  Respiratory system: Bilateral decreased breath sounds at bases Cardiovascular system: S1 & S2 heard, Rate controlled Gastrointestinal system: Abdomen is nondistended, soft and mildly tender in the lower quadrant.  Lower quadrant dressing present. Normal bowel sounds heard. Extremities: No cyanosis, clubbing, edema   Data Reviewed: I have personally reviewed following labs and imaging studies  CBC: Recent Labs  Lab 12/19/17 1256 12/20/17 1040 12/21/17 0505  WBC 9.7 9.3 9.2  NEUTROABS  --   --  7.2  HGB 8.6* 7.6* 7.6*  HCT 26.7* 23.8* 23.5*  MCV 88.4 87.2 88.3  PLT 101* 110* 024*   Basic Metabolic Panel: Recent Labs  Lab 12/19/17 1256 12/20/17 1040 12/21/17  0505  NA 138 138 141  K 3.1* 3.1* 3.6  CL 93* 96* 100  CO2 33* 32 32  GLUCOSE 113* 186* 108*  BUN $Re'18 15 11  'ahP$ CREATININE 1.04 1.10 0.95  CALCIUM 8.4* 8.0* 8.1*  MG  --  1.5*  --    GFR: Estimated Creatinine Clearance: 104.5 mL/min (by C-G formula based on SCr of 0.95 mg/dL). Liver Function Tests: Recent Labs  Lab 12/21/17 0505  AST 16  ALT 13    ALKPHOS 48  BILITOT 0.7  PROT 4.8*  ALBUMIN 2.2*   No results for input(s): LIPASE, AMYLASE in the last 168 hours. No results for input(s): AMMONIA in the last 168 hours. Coagulation Profile: Recent Labs  Lab 12/19/17 1256  INR 1.05   Cardiac Enzymes: No results for input(s): CKTOTAL, CKMB, CKMBINDEX, TROPONINI in the last 168 hours. BNP (last 3 results) No results for input(s): PROBNP in the last 8760 hours. HbA1C: No results for input(s): HGBA1C in the last 72 hours. CBG: Recent Labs  Lab 12/19/17 0746  GLUCAP 138*   Lipid Profile: No results for input(s): CHOL, HDL, LDLCALC, TRIG, CHOLHDL, LDLDIRECT in the last 72 hours. Thyroid Function Tests: No results for input(s): TSH, T4TOTAL, FREET4, T3FREE, THYROIDAB in the last 72 hours. Anemia Panel: Recent Labs    12/20/17 1040  FERRITIN 700*  TIBC 150*  IRON 12*   Sepsis Labs: No results for input(s): PROCALCITON, LATICACIDVEN in the last 168 hours.  Recent Results (from the past 240 hour(s))  Blood culture (routine x 2)     Status: None (Preliminary result)   Collection Time: 12/19/17 12:45 PM  Result Value Ref Range Status   Specimen Description   Final    BLOOD PORTA CATH Performed at [facility] [address] [place], [place] [address]    Special Requests   Final    BOTTLES DRAWN AEROBIC AND ANAEROBIC Blood Culture adequate volume Performed at [facility] [address], [place], [place] [address]    Culture   Final    NO GROWTH 2 DAYS Performed at [facility] [address], [place], [place] [address]    Report Status PENDING  Incomplete  Blood culture (routine x 2)     Status: None (Preliminary result)   Collection Time: 12/19/17 12:55 PM  Result Value Ref Range Status   Specimen Description   Final    BLOOD LEFT HAND Performed at [facility] [address] [place], [place] [address]    Special Requests   Final    BOTTLES  DRAWN AEROBIC AND ANAEROBIC Blood Culture results may not be optimal due to an inadequate volume of blood received in culture bottles Performed at [facility] [address], [place], [place] [address]    Culture   Final    NO GROWTH 2 DAYS Performed at [facility] [address] [place], [place] [address]    Report Status PENDING  Incomplete  Aerobic/Anaerobic Culture (surgical/deep wound)     Status: None (Preliminary result)   Collection Time: 12/19/17  5:49 PM  Result Value Ref Range Status   Specimen Description   Final    ABSCESS DIVERTICULAR Performed at [facility] [address], [place], [place] [address]    Special Requests   Final    Immunocompromised Performed at [facility], [place] [address], [place], [place] [address]    Gram Stain  Final    ABUNDANT WBC PRESENT, PREDOMINANTLY PMN ABUNDANT GRAM POSITIVE COCCI MODERATE GRAM NEGATIVE RODS    Culture   Final    CULTURE REINCUBATED FOR BETTER GROWTH Performed at [facility], [place] [address] [place], [place] [address]    Report Status PENDING  Incomplete         Radiology Studies: Ct Image Guided Drainage By Percutaneous Catheter  Result Date: 12/20/2017 INDICATION: 67 year old male with and unexpected intra-abdominal abscess identified on PET-CT earlier today. EXAM: CT GUIDED DRAINAGE OF  ABSCESS MEDICATIONS: The patient is currently admitted to the hospital and receiving intravenous antibiotics. The antibiotics were administered within an appropriate time frame prior to the initiation of the procedure. ANESTHESIA/SEDATION: 3 mg IV Versed 100 mcg IV Fentanyl Moderate Sedation Time:  30 minutes The patient was continuously monitored during the procedure by the interventional radiology nurse under my direct supervision. COMPLICATIONS: None immediate. TECHNIQUE: Informed written consent was obtained from the patient after a thorough discussion  of the procedural risks, benefits and alternatives. All questions were addressed. Maximal Sterile Barrier Technique was utilized including caps, mask, sterile gowns, sterile gloves, sterile drape, hand hygiene and skin antiseptic. A timeout was performed prior to the initiation of the procedure. PROCEDURE: The operative field was prepped with Chlorhexidine in a sterile fashion, and a sterile drape was applied covering the operative field. A sterile gown and sterile gloves were used for the procedure. Local anesthesia was provided with 1% Lidocaine. A planning axial CT scan was performed. The fluid and gas collection in the low anterior abdomen was successfully identified. A suitable skin entry site was selected and marked. Local anesthesia was attained by infiltration with 1% lidocaine. A small dermatotomy was made. Under intermittent CT guidance, an 18 gauge trocar needle was carefully advanced through the anterior abdominal wall and into the fluid and gas collection. A wire was then advanced into the collection. The trocar needle was removed. The skin tract was dilated to 37 [place] and a [place] 12 [place] all-purpose drainage catheter was advanced over the wire and formed. Aspiration was then performed yielding approximately 200 mL of foul-smelling thin brown fluid. A sample was collected and sent for Gram stain and culture. Post drain placement axial CT imaging was performed which demonstrates excellent drain placement and near-total interval resolution of the abscess cavity. The drainage catheter was then connected to JP bulb suction and secured to the skin with an 0 Prolene suture and an adhesive fixation device. The patient tolerated the procedure well. FINDINGS: Approximately 200 mL foul-smelling thin brown fluid. IMPRESSION: Successful placement of a 12 French drainage catheter. PLAN: 1. Maintain drain to JP bulb suction. 2. Cultures are pending. 3. Recommend flushing drain at least once per shift. 4. Follow-up  in interventional radiology clinic in 2 weeks with repeat CT scan of the pelvis with intravenous contrast to evaluate for residual/recurrent abscess and drain injection under fluoroscopy to evaluate for fistulous communication with the sigmoid colon or rectum. Signed, Criselda Peaches, MD, [facility] Radiology Electronically Signed   By: Jacqulynn Cadet M.D.   On: 12/20/2017 09:07        Scheduled Meds: . amLODipine  10 mg Oral Daily  . docusate sodium  100 mg Oral BID  . enoxaparin (LOVENOX) injection  40 mg Subcutaneous Q24H  . gabapentin  300 mg Oral BID  . polyethylene glycol  17 g Oral Daily  . potassium chloride  40 mEq Oral Once  .  sodium chloride flush  5 mL Intracatheter Q8H   Continuous Infusions: . sodium chloride 100 mL/hr at 12/20/17 1857  . sodium chloride Stopped (12/20/17 1829)  . piperacillin-tazobactam (ZOSYN)  IV 3.375 g (12/21/17 0507)     LOS: 1 day        Aline August, MD [facility] Pager [phone]  If 7PM-7AM, please contact night-coverage [email or web address] Password [email or web address] 12/21/2017, 1:51 PM

## 2017-12-21 NOTE — Progress Notes (Signed)
[name] Rose is feeling okay.  He does have iron deficiency.  I will give him a dose of IV iron.  Cultures still not yet back on the abdominal abscess fluid.  He is still worried that he has not had a bowel movement.  I will check a plain film of his abdomen.  If this looks okay, then I would consider some type of laxative program for him.  His hemoglobin is dropping.  It is now 7.6.  Again I will give him some iron.  Iron will not work for another couple weeks.  It is possible he may need to be transfused.  He has had no cough.  He said no vomiting.  He is eating okay.  He is out of bed a little bit.  His drainage from the abscess is still noted.  His white cell count is 9.2.  Hemoglobin 7.6.  Platelet count 127,000.  His creatinine is 0.95.  His liver function test are normal.  His sodium is 141 potassium is 3.6.  On his exam, his temperature is 98.  Pulse 80.  Blood pressure 129/75.  Lungs are clear bilaterally.  Oral exam shows no oral lesions.  He has no mucositis.  Cardiac exam regular rate and rhythm.  Abdomen is soft.  He has a drainage catheter in place.  Bowel sounds are active.  There is no guarding or rebound tenderness.  Extremities shows no clubbing, cyanosis or edema.  Neurological exam is nonfocal.  [name] Rose is a 67 year old white male.  He has metastatic bladder cancer.  He is responding to systemic chemotherapy.  He now has this abdominal abscess.  Looks like a diverticular abscess.  We will see what the abdominal film shows.  Hopefully, culture results will be out today.  I do not know if he would need another CT of the abdomen to see how this abscess is improving.  [name], MD  Acts 16:31

## 2017-12-21 NOTE — Final Consult Note (Signed)
Consultant Final Sign-Off Note    Assessment/Final recommendations  [name] Rose is a 67 y.o. male with a history of metastatic bladder cancer on chemo who is being followed by [facility] for pelvic abscess, suspect 2/2 colonic microperforation. He received a percutaneous drain that is draining appropriately. He is clinically improving with non-operative management and tolerating PO intake. He would benefit from a bowel regimen however I would refrain from an aggressive bowel regimen right now due to acute injury to the colon.  Wound care (if applicable): per IR   Diet at discharge: gradually advance to high fiber diet.   Activity at discharge: per primary team   Follow-up appointment:  Schedule an appointment in 3-4 weeks with Dr. Leighton Ruff to discuss possible elective partial colectomy, office information provided in AVS.    Pending results:  Unresulted Labs (From admission, onward)    Start     Ordered   12/22/17 0659  Type and screen [facility]  Once,   R    Comments:  [place]    12/21/17 0659   12/21/17 0500  CBC with Differential/Platelet  Daily,   R    Question:  Specimen collection method  Answer:  Unit=Unit collect   12/20/17 0727   12/21/17 0500  Comprehensive metabolic panel  Daily,   R    Question:  Specimen collection method  Answer:  Unit=Unit collect   12/20/17 0727           Medication recommendations: recommend starting daily stool softeners and Miralax 1-2 times daily for constipation. Consider starting metamucil in 2-3 weeks for continued issues with constipation. Avoid magnesium citrate, or very strong laxatives at this time.    Other recommendations:    Thank you for allowing [place] to participate in the care of your patient!  Please consult [place] again if you have further needs for your patient.  Jill Alexanders 12/21/2017 7:52 AM    Subjective   Overall feels better, some soreness around drain. Very concerned  about constipation, stating he has not had a normal BM in 2 weeks. Endorses early satiety and intermittent nausea. Denies fever, chills, SOB.  Objective  Vital signs in last 24 hours: Temp:  [98 F (36.7 C)-98.8 F (37.1 C)] 98 F (36.7 C) (09/11 0509) Pulse Rate:  [80-86] 80 (09/11 0509) Resp:  [16-20] 16 (09/11 0509) BP: (125-129)/(74-76) 129/75 (09/11 0509) SpO2:  [92 %-97 %] 96 % (09/11 0509)  General: Alert, no acute distress, cooperative CV: RRR, no m/r/g, no peripheral edema Pulm: CTAB, normal effort Abd: soft, non-tender, non-distended, perc drain in place with cloudy SS drainage.  30cc/24h Skin: warm and dry, no rashes Psych: A&Ox3  Pertinent labs and Studies: Recent Labs    12/19/17 1256 12/20/17 1040 12/21/17 0505  WBC 9.7 9.3 9.2  HGB 8.6* 7.6* 7.6*  HCT 26.7* 23.8* 23.5*   BMET Recent Labs    12/20/17 1040 12/21/17 0505  NA 138 141  K 3.1* 3.6  CL 96* 100  CO2 32 32  GLUCOSE 186* 108*  BUN 15 11  CREATININE 1.10 0.95  CALCIUM 8.0* 8.1*   No results for input(s): LABURIN in the last 72 hours. Results for orders placed or performed during the hospital encounter of 12/19/17  Blood culture (routine x 2)     Status: None (Preliminary result)   Collection Time: 12/19/17 12:45 PM  Result Value Ref Range Status   Specimen Description   Final  BLOOD PORTA CATH Performed at [facility], [place] [address], [place], [place] [address]    Special Requests   Final    BOTTLES DRAWN AEROBIC AND ANAEROBIC Blood Culture adequate volume Performed at [facility] [address] [place], [place] [address]    Culture   Final    NO GROWTH 2 DAYS Performed at [facility] [address], [place], [place] [address]    Report Status PENDING  Incomplete  Blood culture (routine x 2)     Status: None (Preliminary result)   Collection Time: 12/19/17 12:55 PM  Result Value Ref Range Status   Specimen Description   Final     BLOOD LEFT HAND Performed at  [address] [place], [place] [address]    Special Requests   Final    BOTTLES DRAWN AEROBIC AND ANAEROBIC Blood Culture results may not be optimal due to an inadequate volume of blood received in culture bottles Performed at [facility] [address] [place], [place] [address]    Culture   Final    NO GROWTH 2 DAYS Performed at [facility] [address] [place], [place] [address]    Report Status PENDING  Incomplete  Aerobic/Anaerobic Culture (surgical/deep wound)     Status: None (Preliminary result)   Collection Time: 12/19/17  5:49 PM  Result Value Ref Range Status   Specimen Description   Final    ABSCESS DIVERTICULAR Performed at [facility] [address] [place], [place] [address]    Special Requests   Final    Immunocompromised Performed at [facility], [place] [address] [place], [place] [address]    Gram Stain   Final    ABUNDANT WBC PRESENT, PREDOMINANTLY PMN ABUNDANT GRAM POSITIVE COCCI MODERATE GRAM NEGATIVE RODS    Culture   Final    TOO YOUNG TO READ Performed at [facility], [place] [address], [place], [place] [address]    Report Status PENDING  Incomplete    Imaging: No results found.

## 2017-12-22 LAB — CBC WITH DIFFERENTIAL/PLATELET
Basophils Absolute: 0 10*3/uL (ref 0.0–0.1)
Basophils Relative: 0 %
Eosinophils Absolute: 0.1 10*3/uL (ref 0.0–0.7)
Eosinophils Relative: 1 %
HCT: 24.1 % — ABNORMAL LOW (ref 39.0–52.0)
Hemoglobin: 7.6 g/dL — ABNORMAL LOW (ref 13.0–17.0)
Lymphocytes Relative: 18 %
Lymphs Abs: 1.5 10*3/uL (ref 0.7–4.0)
MCH: 27.9 pg (ref 26.0–34.0)
MCHC: 31.5 g/dL (ref 30.0–36.0)
MCV: 88.6 fL (ref 78.0–100.0)
Monocytes Absolute: 0.5 10*3/uL (ref 0.1–1.0)
Monocytes Relative: 6 %
Neutro Abs: 6.7 10*3/uL (ref 1.7–7.7)
Neutrophils Relative %: 75 %
Platelets: 170 10*3/uL (ref 150–400)
RBC: 2.72 MIL/uL — ABNORMAL LOW (ref 4.22–5.81)
RDW: 17.6 % — ABNORMAL HIGH (ref 11.5–15.5)
WBC: 8.8 10*3/uL (ref 4.0–10.5)

## 2017-12-22 LAB — MAGNESIUM: Magnesium: 1.4 mg/dL — ABNORMAL LOW (ref 1.7–2.4)

## 2017-12-22 LAB — BASIC METABOLIC PANEL
Anion gap: 9 (ref 5–15)
BUN: 12 mg/dL (ref 8–23)
CO2: 32 mmol/L (ref 22–32)
Calcium: 8.3 mg/dL — ABNORMAL LOW (ref 8.9–10.3)
Chloride: 99 mmol/L (ref 98–111)
Creatinine, Ser: 1 mg/dL (ref 0.61–1.24)
GFR calc Af Amer: >60 mL/min (ref >60)
GFR calc non Af Amer: >60 mL/min (ref >60)
Glucose, Bld: 110 mg/dL — ABNORMAL HIGH (ref 70–99)
Potassium: 3.6 mmol/L (ref 3.5–5.1)
Sodium: 140 mmol/L (ref 135–145)

## 2017-12-22 LAB — TYPE AND SCREEN
ABO/RH(D): A POS
Antibody Screen: NEGATIVE

## 2017-12-22 MED ORDER — MAGNESIUM SULFATE 2 GM/50ML IV SOLN
2.0000 g | Freq: Once | INTRAVENOUS | Status: AC
  Administered 2017-12-22: 2 g via INTRAVENOUS
  Filled 2017-12-22: qty 50

## 2017-12-22 NOTE — Progress Notes (Signed)
PT Cancellation Note  Patient Details Name: [name] MRN: [id number] DOB: 12/25/1950   Cancelled Treatment:    Reason Eval/Treat Not Completed: PT screened, no needs identified, will sign off(Pt/wife report pt is independent with mobility, no PT indicated. Encouraged pt to ambulate in halls TID to minimize deconditioning. )   Philomena Doheny 12/22/2017, 9:46 AM [phone]

## 2017-12-22 NOTE — Progress Notes (Signed)
Patient ID: [name] Rose, male   DOB: 07-25-1950, 67 y.o.   MRN: [id number]  PROGRESS NOTE    [name] Rose  BSJ:[id number] DOB: 1950-11-05 DOA: 12/19/2017 PCP: Shirline Frees, MD   Brief Narrative:  67 year old male with history of metastatic bladder cancer, prostate cancer, hypertension presented on 12/19/2017 after pelvic abscess was noted on routine PET/CT done for follow-up of his bladder cancer.  Patient was started on intravenous antibiotics.  General surgery was consulted who recommended IR guided drainage.  Patient underwent IR guided drainage on 12/19/2017.   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  Currently hemodynamically stable.  Blood cultures negative so far.  Antibiotic plan as below  Pelvic abscess most likely secondary to colonic microperforation -Status post IR guided percutaneous drainage.  Gram stain of fluid showed gram-positive cocci and gram-negative rods. fluid culture is growing few gram-negative rods, identification and sensitivities are pending.  Currently on Zosyn which will be continued. -Outpatient follow-up with general surgery for need for probable colectomy -Outpatient follow-up with IR with repeat CT scan in 1 to 2 weeks  Constipation -Improving.  Continue Senokot and MiraLAX.  Avoid strong stimulant laxatives for the risk of worsening colonic microperforation as per general surgery.  Hypomagnesemia -Replace.  Repeat a.m. labs.  Essential hypertension -Stable.  Continue amlodipine  Anemia of chronic disease -Stable.  Monitor.  Transfuse if hemoglobin drops below 7  Recurrent metastatic bladder cancer -Currently followed by an [name]  Thrombocytopenia -Stable.  No signs of bleeding.  Monitor   DVT prophylaxis: Lovenox Code Status: Full Family  Communication: Spoke to wife at bedside  disposition Plan: Home in 1 to 2 days once culture results finalize  Consultants: General surgery/IR/oncology  Procedures: IR guided percutaneous drainage of pelvic abscess  Antimicrobials: Zosyn from 12/19/2017 onwards   Subjective: Patient seen and examined at bedside.  Patient states that he had small bowel movements yesterday.  No worsening lower abdominal pain, nausea or vomiting.  No overnight fevers.  Patient wants to go home.  Objective: Vitals:   12/21/17 0509 12/21/17 1439 12/21/17 2209 12/22/17 0649  BP: 129/75 126/77 125/74 (!) 141/85  Pulse: 80 86 86 83  Resp: $Remo'16 18 20 20  'kAeRV$ Temp: 98 F (36.7 C) 98.1 F (36.7 C) 98.3 F (36.8 C) 98.6 F (37 C)  TempSrc: Oral Oral Oral Oral  SpO2: 96% 96% 98% 93%  Weight:      Height:        Intake/Output Summary (Last 24 hours) at 12/22/2017 1158 Last data filed at 12/22/2017 1100 Gross per 24 hour  Intake 1101.27 ml  Output -  Net 1101.27 ml   Filed Weights   12/19/17 1105  Weight: 121.6 kg    Examination:  General exam: Appears calm and comfortable; in no distress Respiratory system: Bilateral decreased breath sounds at bases; no crackles Cardiovascular system: S1 & S2 heard, Rate controlled Gastrointestinal system: Abdomen is nondistended, soft and mildly tender in the lower quadrant.  Lower quadrant dressing present. Normal bowel sounds heard. Extremities: No cyanosis,  edema   Data Reviewed: I have personally reviewed following labs and imaging studies  CBC: Recent Labs  Lab 12/19/17 1256 12/20/17 1040 12/21/17 0505 12/22/17 0500  WBC 9.7 9.3 9.2 8.8  NEUTROABS  --   --  7.2 6.7  HGB 8.6*  7.6* 7.6* 7.6*  HCT 26.7* 23.8* 23.5* 24.1*  MCV 88.4 87.2 88.3 88.6  PLT 101* 110* 127* 637   Basic Metabolic Panel: Recent Labs  Lab 12/19/17 1256 12/20/17 1040 12/21/17 0505 12/22/17 0500  NA 138 138 141 140  K 3.1* 3.1* 3.6 3.6  CL 93* 96* 100 99  CO2 33* 32 32 32    GLUCOSE 113* 186* 108* 110*  BUN $Re'18 15 11 12  'EOx$ CREATININE 1.04 1.10 0.95 1.00  CALCIUM 8.4* 8.0* 8.1* 8.3*  MG  --  1.5*  --  1.4*   GFR: Estimated Creatinine Clearance: 99.3 mL/min (by C-G formula based on SCr of 1 mg/dL). Liver Function Tests: Recent Labs  Lab 12/21/17 0505  AST 16  ALT 13  ALKPHOS 48  BILITOT 0.7  PROT 4.8*  ALBUMIN 2.2*   No results for input(s): LIPASE, AMYLASE in the last 168 hours. No results for input(s): AMMONIA in the last 168 hours. Coagulation Profile: Recent Labs  Lab 12/19/17 1256  INR 1.05   Cardiac Enzymes: No results for input(s): CKTOTAL, CKMB, CKMBINDEX, TROPONINI in the last 168 hours. BNP (last 3 results) No results for input(s): PROBNP in the last 8760 hours. HbA1C: No results for input(s): HGBA1C in the last 72 hours. CBG: Recent Labs  Lab 12/19/17 0746  GLUCAP 138*   Lipid Profile: No results for input(s): CHOL, HDL, LDLCALC, TRIG, CHOLHDL, LDLDIRECT in the last 72 hours. Thyroid Function Tests: No results for input(s): TSH, T4TOTAL, FREET4, T3FREE, THYROIDAB in the last 72 hours. Anemia Panel: Recent Labs    12/20/17 1040  FERRITIN 700*  TIBC 150*  IRON 12*   Sepsis Labs: No results for input(s): PROCALCITON, LATICACIDVEN in the last 168 hours.  Recent Results (from the past 240 hour(s))  Blood culture (routine x 2)     Status: None (Preliminary result)   Collection Time: 12/19/17 12:45 PM  Result Value Ref Range Status   Specimen Description   Final    BLOOD PORTA CATH Performed at [facility] [address] [place], [place] [address]    Special Requests   Final    BOTTLES DRAWN AEROBIC AND ANAEROBIC Blood Culture adequate volume Performed at [facility] [address], [place], [place] [address]    Culture   Final    NO GROWTH 3 DAYS Performed at [facility], [place] [address] [place], [place] [address]    Report Status PENDING  Incomplete  Blood culture  (routine x 2)     Status: None (Preliminary result)   Collection Time: 12/19/17 12:55 PM  Result Value Ref Range Status   Specimen Description   Final    BLOOD LEFT HAND Performed at [facility] [address] [place], [place] [address]    Special Requests   Final    BOTTLES DRAWN AEROBIC AND ANAEROBIC Blood Culture results may not be optimal due to an inadequate volume of blood received in culture bottles Performed at [facility] [address] [place], [place] [address]    Culture   Final    NO GROWTH 3 DAYS Performed at [facility], [place] [address], [place], [place] [address]    Report Status PENDING  Incomplete  Aerobic/Anaerobic Culture (surgical/deep wound)     Status: None (Preliminary result)   Collection Time: 12/19/17  5:49 PM  Result Value Ref Range Status   Specimen Description   Final    ABSCESS  DIVERTICULAR Performed at [facility], [place] [address] [place], [place] [address]    Special Requests   Final    Immunocompromised Performed at [facility], [place] [address], [place], [place] [address]    Gram Stain   Final    ABUNDANT WBC PRESENT, PREDOMINANTLY PMN ABUNDANT GRAM POSITIVE COCCI MODERATE GRAM NEGATIVE RODS    Culture   Final    CULTURE REINCUBATED FOR BETTER GROWTH Performed at [facility], [place] [address] [place], [place] [address]    Report Status PENDING  Incomplete         Radiology Studies: Dg Abd 2 Views  Result Date: 12/21/2017 CLINICAL DATA:  Constipation, delayed colonic transit. Intra-abdominal abscess with drainage catheter. EXAM: ABDOMEN - 2 VIEW COMPARISON:  Abdominal and pelvic CT scan of December 19, 2017. FINDINGS: There are loops of moderately distended gas-filled small bowel to the left of midline. Less distended small bowel loops are noted to the right of midline. There is small air in fluid levels in small bowel on the upright view. The  stool and gas pattern in the colon is within the limits of normal. There is no definite rectal gas or stool. The drainage catheter projects over the midportion of the sacrum in the midline. IMPRESSION: Bowel gas pattern compatible with an ileus. The colonic stool burden does not appear significantly increased. Electronically Signed   By: David  Martinique M.D.   On: 12/21/2017 14:54        Scheduled Meds: . amLODipine  10 mg Oral Daily  . enoxaparin (LOVENOX) injection  40 mg Subcutaneous Q24H  . gabapentin  300 mg Oral BID  . polyethylene glycol  17 g Oral BID  . potassium chloride  40 mEq Oral Once  . senna-docusate  1 tablet Oral BID  . sodium chloride flush  5 mL Intracatheter Q8H   Continuous Infusions: . sodium chloride Stopped (12/21/17 1443)  . piperacillin-tazobactam (ZOSYN)  IV 3.375 g (12/22/17 0412)     LOS: 2 days        Aline August, MD [facility] Pager [phone]  If 7PM-7AM, please contact night-coverage [email or web address] Password [facility] 12/22/2017, 11:58 AM

## 2017-12-22 NOTE — Progress Notes (Signed)
Referring Physician(s): Dr. Lennette Bihari Campos/Dr. Burney Gauze  Supervising Physician: Arne Cleveland  Patient Status:  [name] - In-pt  Chief Complaint: Follow-up pelvic abscess drain placement 9/9 by Dr. Laurence Ferrari  Subjective:  [name] [place] a 67 y.o.malewith a past medical history significant for HTN, history of prostate cancer s/p radical prostatectomy (2008) and metastatic bladder cancer for which he is followed by [name] and being treated with M-VAC chemotherapy. Patient received outpatient PET scan on 12/19/17 which showed improvement in nodal disease however there was development of a fluid and gas collection in the central pelvis which tracked along the sigmoid colon. He was sent to ED for further evaluation and consults were placed to both general surgery and IR for management. General surgery felt that there was no indication for surgical intervention at that time given patient was essentially asymptomatic. IR placed pelvic abscess drain on 9/9 without complications and fluid was sent for testing - cultures of this fluid are still pending as of today. He continues on IV Zosyn per hospitalist.   Patient was concerned yesterday that he still has not had a complete bowel movement and an abdominal x-ray was performed which showed bowel gas pattern compatible with ileus. He states he had a very small bowel movement this morning and he continues to have a moderate amount of flatus. He reports his appetite is ok but does not like the hospital food and is finding it difficult to eat. He denies nausea or vomiting.  Per most recent lab work, hemoglobin has decreased to 7.6 today with previous 8.6 (9/9) and 9.5 (8/26) - he denies over signs of bleeding including hematochezia, melena, hemoptysis, hematemesis, nosebleeds or bleeding gums. This is being followed by hospitalist with plans to transfuse if hemoglobin decreases to <7.   Overall, patient states that he feels well, he states he is  frustrated with the quality of food, the perceived lack of communication regarding his discharge plans and that he will be missing golf.   Allergies: Patient has no known allergies.  Medications: Prior to Admission medications   Medication Sig Start Date End Date Taking? Authorizing Provider  amLODipine (NORVASC) 10 MG tablet Take 10 mg by mouth daily.   Yes [provider]  bisoprolol-hydrochlorothiazide (ZIAC) 10-6.25 MG tablet Take 1 tablet by mouth daily.   Yes [provider]  Cinnamon 500 MG TABS Take 1 tablet by mouth 2 (two) times daily.    Yes [provider]  dexamethasone (DECADRON) 4 MG tablet Take 2 tablets by mouth once a day on the day after chemotherapy and then take 2 tablets two times a day for 2 days. Take with food. 11/10/17  Yes Volanda Napoleon, MD  gabapentin (NEURONTIN) 100 MG capsule Take 3 capsules (300 mg total) by mouth 2 (two) times daily. 09/20/17  Yes Volanda Napoleon, MD  lidocaine-prilocaine (EMLA) cream Apply to affected area once 11/10/17  Yes Ennever, Rudell Cobb, MD  LORazepam (ATIVAN) 0.5 MG tablet Take 1 tablet (0.5 mg total) by mouth every 6 (six) hours as needed (Nausea or vomiting). 11/10/17  Yes Volanda Napoleon, MD  losartan (COZAAR) 100 MG tablet Take 100 mg by mouth daily.   Yes [provider]  MAGNESIUM PO Take 400 mg by mouth at bedtime. Leg cramps   Yes [provider]  Naproxen Sodium (ALEVE) 220 MG CAPS Take 220 mg by mouth 2 (two) times daily.    Yes [provider]  ondansetron (ZOFRAN) 8 MG  tablet Take 1 tablet (8 mg total) by mouth 2 (two) times daily as needed. Start on the third day after chemotherapy. 11/10/17  Yes Volanda Napoleon, MD  prochlorperazine (COMPAZINE) 10 MG tablet Take 1 tablet (10 mg total) by mouth every 6 (six) hours as needed (Nausea or vomiting). 11/10/17  Yes Volanda Napoleon, MD  Pyridoxine HCl (VITAMIN B-6) 250 MG tablet Take 1 tablet (250 mg total) by mouth daily. Patient  taking differently: Take 300 mg by mouth daily.  05/24/17  Yes Volanda Napoleon, MD  magic mouthwash w/lidocaine SOLN Take 5 mLs by mouth 4 (four) times daily. Patient not taking: Reported on 09/20/2017 08/10/17   Volanda Napoleon, MD     Vital Signs: BP (!) 141/85 (BP Location: Left Arm)   Pulse 83   Temp 98.6 F (37 C) (Oral)   Resp 20   Ht $R'6\' 1"'yh$  (1.854 m)   Wt 268 lb (121.6 kg)   SpO2 93%   BMI 35.36 kg/m   Physical Exam  Constitutional: He is oriented to person, place, and time. No distress.  HENT:  Head: Normocephalic.  Cardiovascular: Normal rate, regular rhythm and normal heart sounds.  Pulmonary/Chest: Effort normal and breath sounds normal.  Abdominal: Soft. He exhibits no distension. There is tenderness (mild; to palpation at drain insertion site).  Drain insertion site clean, dry, intact without discharge or erythema. JP with ~30 cc serosanguineous output; flushes and aspirates well.  Neurological: He is alert and oriented to person, place, and time.  Skin: Skin is warm and dry. He is not diaphoretic.  Psychiatric: He has a normal mood and affect. His behavior is normal. Judgment and thought content normal.  Nursing note and vitals reviewed.   Imaging: Nm Pet Image Restag (ps) Skull Base To Thigh  Result Date: 12/19/2017 CLINICAL DATA:  Subsequent treatment strategy for bladder cancer. EXAM: NUCLEAR MEDICINE PET SKULL BASE TO THIGH TECHNIQUE: 13.6 mCi F-18 FDG was injected intravenously. Full-ring PET imaging was performed from the skull base to thigh after the radiotracer. CT data was obtained and used for attenuation correction and anatomic localization. Fasting blood glucose: 138 mg/dl COMPARISON:  10/27/2017 FINDINGS: Mediastinal blood pool activity: SUV max 2.5 NECK: Hypermetabolic left supraclavicular load seen on the previous study has decreased in size measuring 4 mm today compared to 11 mm previously. No residual hypermetabolism in this lymph node on today's exam. No  new cervical lymphadenopathy. Incidental CT findings: none CHEST: No hypermetabolic pulmonary nodule or mass. No hypermetabolic mediastinal or hilar lymphadenopathy. Incidental CT findings: No suspicious pulmonary nodule or mass. ABDOMEN/PELVIS: Interval development of a 10.8 x 7.1 cm fluid collection with air-fluid level in the central pelvis, tracking along and involving the sigmoid colon. Circumferential wall thickening is noted in the distal sigmoid colon just beyond this large fluid collection. The rim of the collection is hypermetabolic. There is an associated 2.3 cm focal gas collection in the root of the sigmoid mesocolon (4:67). No hypermetabolic soft tissue metastases evident in the abdomen. Hypermetabolic right pelvic lymphadenopathy on the prior study has decreased in the interval. 18 mm short axis external iliac lymph node measured previously has decreased to 12 mm short axis. SUV max = 2.4 on today's study compared to 19.3 previously. Other lymph nodes in the same region show similar interval decrease in size and hypermetabolism. Incidental CT findings: There is abdominal aortic atherosclerosis without aneurysm. Beam hardening artifact from left hip replacement obscures inferior pelvis. Calcification associated with the right bladder  wall is similar to prior. SKELETON: Diffuse FDG accumulation in the marrow space is new in the interval and presumably reflects stimulatory effects of therapy. Incidental CT findings: none IMPRESSION: 1. Interval development of a 10.8 x 7.1 cm collection of fluid and gas in the central pelvis, tracking along the sigmoid colon. Inflammatory changes track proximally in the sigmoid mesocolon where a discrete 2.3 cm gas collection is identified. Imaging features are compatible with pelvic abscess, potentially diverticular in origin. 2. Interval decrease in size and hypermetabolism of previously documented nodal disease in the left supraclavicular region and right anterior  pelvic sidewall. 3. Diffuse marrow accumulation of FDG on the current study presumably related to stimulatory effects of therapy. 4.  Aortic Atherosclerois (ICD10-170.0) These results will be called to the ordering clinician or representative by the Radiologist Assistant, and communication documented in the PACS or zVision Dashboard. Electronically Signed   By: Misty Stanley M.D.   On: 12/19/2017 09:58   Dg Abd 2 Views  Result Date: 12/21/2017 CLINICAL DATA:  Constipation, delayed colonic transit. Intra-abdominal abscess with drainage catheter. EXAM: ABDOMEN - 2 VIEW COMPARISON:  Abdominal and pelvic CT scan of December 19, 2017. FINDINGS: There are loops of moderately distended gas-filled small bowel to the left of midline. Less distended small bowel loops are noted to the right of midline. There is small air in fluid levels in small bowel on the upright view. The stool and gas pattern in the colon is within the limits of normal. There is no definite rectal gas or stool. The drainage catheter projects over the midportion of the sacrum in the midline. IMPRESSION: Bowel gas pattern compatible with an ileus. The colonic stool burden does not appear significantly increased. Electronically Signed   By: David  Martinique M.D.   On: 12/21/2017 14:54   Ct Image Guided Drainage By Percutaneous Catheter  Result Date: 12/20/2017 INDICATION: 67 year old male with and unexpected intra-abdominal abscess identified on PET-CT earlier today. EXAM: CT GUIDED DRAINAGE OF  ABSCESS MEDICATIONS: The patient is currently admitted to the hospital and receiving intravenous antibiotics. The antibiotics were administered within an appropriate time frame prior to the initiation of the procedure. ANESTHESIA/SEDATION: 3 mg IV Versed 100 mcg IV Fentanyl Moderate Sedation Time:  30 minutes The patient was continuously monitored during the procedure by the interventional radiology nurse under my direct supervision. COMPLICATIONS: None  immediate. TECHNIQUE: Informed written consent was obtained from the patient after a thorough discussion of the procedural risks, benefits and alternatives. All questions were addressed. Maximal Sterile Barrier Technique was utilized including caps, mask, sterile gowns, sterile gloves, sterile drape, hand hygiene and skin antiseptic. A timeout was performed prior to the initiation of the procedure. PROCEDURE: The operative field was prepped with Chlorhexidine in a sterile fashion, and a sterile drape was applied covering the operative field. A sterile gown and sterile gloves were used for the procedure. Local anesthesia was provided with 1% Lidocaine. A planning axial CT scan was performed. The fluid and gas collection in the low anterior abdomen was successfully identified. A suitable skin entry site was selected and marked. Local anesthesia was attained by infiltration with 1% lidocaine. A small dermatotomy was made. Under intermittent CT guidance, an 18 gauge trocar needle was carefully advanced through the anterior abdominal wall and into the fluid and gas collection. A wire was then advanced into the collection. The trocar needle was removed. The skin tract was dilated to 30 [place] and a Cook 12 [place] all-purpose drainage catheter was advanced  over the wire and formed. Aspiration was then performed yielding approximately 200 mL of foul-smelling thin brown fluid. A sample was collected and sent for Gram stain and culture. Post drain placement axial CT imaging was performed which demonstrates excellent drain placement and near-total interval resolution of the abscess cavity. The drainage catheter was then connected to JP bulb suction and secured to the skin with an 0 Prolene suture and an adhesive fixation device. The patient tolerated the procedure well. FINDINGS: Approximately 200 mL foul-smelling thin brown fluid. IMPRESSION: Successful placement of a 12 French drainage catheter. PLAN: 1. Maintain drain to JP  bulb suction. 2. Cultures are pending. 3. Recommend flushing drain at least once per shift. 4. Follow-up in interventional radiology clinic in 2 weeks with repeat CT scan of the pelvis with intravenous contrast to evaluate for residual/recurrent abscess and drain injection under fluoroscopy to evaluate for fistulous communication with the sigmoid colon or rectum. Signed, Criselda Peaches, MD, [facility] Electronically Signed   By: Jacqulynn Cadet M.D.   On: 12/20/2017 09:07    Labs:  CBC: Recent Labs    12/19/17 1256 12/20/17 1040 12/21/17 0505 12/22/17 0500  WBC 9.7 9.3 9.2 8.8  HGB 8.6* 7.6* 7.6* 7.6*  HCT 26.7* 23.8* 23.5* 24.1*  PLT 101* 110* 127* 170    COAGS: Recent Labs    04/21/17 0729 12/19/17 1256  INR 0.99 1.05    BMP: Recent Labs    12/19/17 1256 12/20/17 1040 12/21/17 0505 12/22/17 0500  NA 138 138 141 140  K 3.1* 3.1* 3.6 3.6  CL 93* 96* 100 99  CO2 33* 32 32 32  GLUCOSE 113* 186* 108* 110*  BUN $Re'18 15 11 12  'ACV$ CALCIUM 8.4* 8.0* 8.1* 8.3*  CREATININE 1.04 1.10 0.95 1.00  GFRNONAA >60 >60 >60 >60  GFRAA >60 >60 >60 >60    LIVER FUNCTION TESTS: Recent Labs    10/10/17 0845 11/10/17 0830 12/05/17 0808 12/21/17 0505  BILITOT 0.7 0.7 0.5 0.7  AST 32 35 32 16  ALT $Re'25 21 23 13  'TLw$ ALKPHOS 56 68 60 48  PROT 6.3* 6.2* 5.8* 4.8*  ALBUMIN 3.4* 3.3* 3.1* 2.2*    Assessment and Plan:  Pelvic abscess of unknown etiology s/p percutaneous drain placed in IR 9/9 by Dr. Laurence Ferrari. Culture still pending from fluid, gram stain shows abundant gram positive cocci and moderate gram negative rods - continues on IV Zosyn currently. Patient c/o ongoing constipation - abdominal x-ray from yesterday shows bowel gas pattern consistent with ileus which is being followed by hospitalist. Patient remains afebrile, WBC 7.8, Hgb decreased to 7.6 but has been stable since 9/10 - no overt signs of bleeding  reported by patient or staff, hospitalist to transfuse if hgb <7. Drain output has not been recorded in last 24 hours - previously 30 cc in 24 hours.  General surgery will see patient on an outpatient basis to discuss elective partial colectomy. IR to continue to follow while inpatient, will follow up in IR drain clinic in about 2 weeks for repeat CT. Continue BID flushes with 3-5 cc NS, record output from drain. D/c per admitting service.   Electronically Signed: Joaquim Nam, PA-C 12/22/2017, 9:40 AM   I spent a total of 25 Minutes at the the patient's bedside AND on the patient's hospital floor or unit, greater than 50% of which was counseling/coordinating care for pelvic abscess drain.

## 2017-12-23 ENCOUNTER — Other Ambulatory Visit: Payer: Self-pay | Admitting: Hematology & Oncology

## 2017-12-23 DIAGNOSIS — E876 Hypokalemia: Secondary | ICD-10-CM

## 2017-12-23 LAB — BASIC METABOLIC PANEL
Anion gap: 10 (ref 5–15)
BUN: 10 mg/dL (ref 8–23)
CO2: 32 mmol/L (ref 22–32)
Calcium: 8.5 mg/dL — ABNORMAL LOW (ref 8.9–10.3)
Chloride: 98 mmol/L (ref 98–111)
Creatinine, Ser: 0.98 mg/dL (ref 0.61–1.24)
GFR calc Af Amer: >60 mL/min (ref >60)
GFR calc non Af Amer: >60 mL/min (ref >60)
Glucose, Bld: 108 mg/dL — ABNORMAL HIGH (ref 70–99)
Potassium: 3.6 mmol/L (ref 3.5–5.1)
Sodium: 140 mmol/L (ref 135–145)

## 2017-12-23 LAB — CBC WITH DIFFERENTIAL/PLATELET
Basophils Absolute: 0 10*3/uL (ref 0.0–0.1)
Basophils Relative: 0 %
Eosinophils Absolute: 0.1 10*3/uL (ref 0.0–0.7)
Eosinophils Relative: 1 %
HCT: 24.4 % — ABNORMAL LOW (ref 39.0–52.0)
Hemoglobin: 7.8 g/dL — ABNORMAL LOW (ref 13.0–17.0)
Lymphocytes Relative: 19 %
Lymphs Abs: 1.5 10*3/uL (ref 0.7–4.0)
MCH: 28.2 pg (ref 26.0–34.0)
MCHC: 32 g/dL (ref 30.0–36.0)
MCV: 88.1 fL (ref 78.0–100.0)
Monocytes Absolute: 0.7 10*3/uL (ref 0.1–1.0)
Monocytes Relative: 8 %
Neutro Abs: 5.7 10*3/uL (ref 1.7–7.7)
Neutrophils Relative %: 72 %
Platelets: 191 10*3/uL (ref 150–400)
RBC: 2.77 MIL/uL — ABNORMAL LOW (ref 4.22–5.81)
RDW: 17.4 % — ABNORMAL HIGH (ref 11.5–15.5)
WBC: 8 10*3/uL (ref 4.0–10.5)

## 2017-12-23 LAB — MAGNESIUM: Magnesium: 1.7 mg/dL (ref 1.7–2.4)

## 2017-12-23 MED ORDER — AMOXICILLIN-POT CLAVULANATE 875-125 MG PO TABS: 1.0000 | ORAL_TABLET | Freq: Two times a day (BID) | ORAL | 0 refills | Status: AC

## 2017-12-23 MED ORDER — POLYETHYLENE GLYCOL 3350 17 G PO PACK: 17.0000 g | PACK | Freq: Two times a day (BID) | ORAL | 0 refills | Status: DC

## 2017-12-23 MED ORDER — VITAMIN B-6 250 MG PO TABS: 300.0000 mg | ORAL_TABLET | Freq: Every day | ORAL | Status: DC

## 2017-12-23 MED ORDER — SENNA 8.6 MG PO TABS: 1.0000 | ORAL_TABLET | Freq: Two times a day (BID) | ORAL | 0 refills | Status: DC

## 2017-12-23 MED ORDER — HEPARIN SOD (PORK) LOCK FLUSH 100 UNIT/ML IV SOLN
500.0000 [IU] | Freq: Once | INTRAVENOUS | Status: AC
  Administered 2017-12-23: 500 [IU] via INTRAVENOUS
  Filled 2017-12-23: qty 5

## 2017-12-23 NOTE — Care Management Important Message (Signed)
Important Message  Patient Details  Name: [name] Rose MRN: [id number] Date of Birth: 05-03-1950   Medicare Important Message Given:  Yes    Kerin Salen 12/23/2017, 10:15 AMImportant Message  Patient Details  Name: [name] Rose MRN: [id number] Date of Birth: [birth year]   Medicare Important Message Given:  Yes    Kerin Salen 12/23/2017, 10:15 AM

## 2017-12-23 NOTE — Discharge Summary (Signed)
Physician Discharge Summary  [name] Rose WER:[id number] DOB: [birth year] DOA: 12/19/2017  PCP: Shirline Frees, MD  Admit date: 12/19/2017 Discharge date: 12/23/2017  Admitted From: Home Disposition: Home  Recommendations for Outpatient Follow-up:  1. Follow up with PCP in 1 week 2. Follow-up with oncology/[name] 3. Follow-up with interventional radiology regarding drain care 4. Follow-up with Dr. Elmo Putt Thomas/general surgery as an outpatient 5. Follow-up in the ED if symptoms worsen or new appear   Home Health: No.  Patient declined home health Equipment/Devices: Percutaneous drain  Discharge Condition: Stable CODE STATUS: Full Diet recommendation: Heart Healthy   Brief/Interim Summary: 67 year old male with history of metastatic bladder cancer, prostate cancer, hypertension presented on 12/19/2017 after pelvic abscess was noted on routine PET/CT done for follow-up of his bladder cancer.  Patient was started on intravenous antibiotics.  General surgery was consulted who recommended IR guided drainage.  Patient underwent IR guided drainage on 12/19/2017.  Fluid culture grew E. coli which is pansensitive.  Patient will be discharged on oral Augmentin.  Discharge Diagnoses:  Principal Problem:   Abscess of abdominal cavity ([place]) Active Problems:   Hypokalemia   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 ([facility])  Sepsis secondary to pelvic abscess -Resolved. Currently hemodynamically stable.  Blood cultures negative so far.  Antibiotic plan as below  Pelvic abscess most likely secondary to colonic microperforation -Status post IR guided percutaneous drainage.    Currently on Zosyn.  Fluid culture is growing pansensitive E. coli.  Discharge home on oral Augmentin for the next 10 days/until patient has drain in. -Outpatient follow-up with general surgery for need for probable colectomy -Outpatient follow-up with IR for follow-up of drain and also might need repeat  CAT scan to follow-up on the abscess.  Constipation -Improving.  Continue Senokot and MiraLAX.  Avoid strong stimulant laxatives for the risk of worsening colonic microperforation as per general surgery.  Hypomagnesemia -Improved.  Outpatient follow-up  Essential hypertension -Stable.  Continue home regimen  Anemia of chronic disease -Stable.    Outpatient follow-up  Recurrent metastatic bladder cancer -Currently followed by an [name].  Outpatient follow-up with oncology  Thrombocytopenia -Stable.  No signs of bleeding.  Outpatient follow-up  Discharge Instructions  Discharge Instructions    Ambulatory referral to Interventional Radiology   Complete by:  As directed    Follow up on percutaneous drain   Ambulatory referral to Oncology   Complete by:  As directed    Call MD for:  difficulty breathing, headache or visual disturbances   Complete by:  As directed    Call MD for:  extreme fatigue   Complete by:  As directed    Call MD for:  hives   Complete by:  As directed    Call MD for:  persistant dizziness or light-headedness   Complete by:  As directed    Call MD for:  persistant nausea and vomiting   Complete by:  As directed    Call MD for:  redness, tenderness, or signs of infection (pain, swelling, redness, odor or green/yellow discharge around incision site)   Complete by:  As directed    Call MD for:  severe uncontrolled pain   Complete by:  As directed    Call MD for:  temperature >100.4   Complete by:  As directed    Diet - low sodium heart healthy   Complete by:  As directed    Increase activity slowly   Complete by:  As directed  Allergies as of 12/23/2017   No Known Allergies     Medication List    STOP taking these medications   magic mouthwash w/lidocaine Soln     TAKE these medications   ALEVE 220 MG Caps Generic drug:  Naproxen Sodium Take 220 mg by mouth 2 (two) times daily.   amLODipine 10 MG tablet Commonly known as:   NORVASC Take 10 mg by mouth daily.   amoxicillin-clavulanate 875-125 MG tablet Commonly known as:  AUGMENTIN Take 1 tablet by mouth 2 (two) times daily for 10 days.   bisoprolol-hydrochlorothiazide 10-6.25 MG tablet Commonly known as:  ZIAC Take 1 tablet by mouth daily.   Cinnamon 500 MG Tabs Take 1 tablet by mouth 2 (two) times daily.   dexamethasone 4 MG tablet Commonly known as:  DECADRON Take 2 tablets by mouth once a day on the day after chemotherapy and then take 2 tablets two times a day for 2 days. Take with food.   gabapentin 100 MG capsule Commonly known as:  NEURONTIN Take 3 capsules (300 mg total) by mouth 2 (two) times daily.   lidocaine-prilocaine cream Commonly known as:  EMLA Apply to affected area once   LORazepam 0.5 MG tablet Commonly known as:  ATIVAN Take 1 tablet (0.5 mg total) by mouth every 6 (six) hours as needed (Nausea or vomiting).   losartan 100 MG tablet Commonly known as:  COZAAR Take 100 mg by mouth daily.   MAGNESIUM PO Take 400 mg by mouth at bedtime. Leg cramps   ondansetron 8 MG tablet Commonly known as:  ZOFRAN Take 1 tablet (8 mg total) by mouth 2 (two) times daily as needed. Start on the third day after chemotherapy.   polyethylene glycol packet Commonly known as:  MIRALAX / GLYCOLAX Take 17 g by mouth 2 (two) times daily.   prochlorperazine 10 MG tablet Commonly known as:  COMPAZINE Take 1 tablet (10 mg total) by mouth every 6 (six) hours as needed (Nausea or vomiting).   senna 8.6 MG Tabs tablet Commonly known as:  SENOKOT Take 1 tablet (8.6 mg total) by mouth 2 (two) times daily.   vitamin B-6 250 MG tablet Take 1 tablet (250 mg total) by mouth daily. What changed:  how much to take      Follow-up Information    Shirline Frees, MD.   Specialty:  Family Medicine Contact information: [place] [address] [phone]        Leighton Ruff, MD. Schedule an appointment as soon as  possible for a visit.   Specialty:  General Surgery Why:  in 3-4 weeks for follow up from recent hospitalization  Contact information: [place] [address] [phone]          No Known Allergies  Consultations:  General surgery/IR/oncology   Procedures/Studies: Nm Pet Image Restag (ps) Skull Base To Thigh  Result Date: 12/19/2017 CLINICAL DATA:  Subsequent treatment strategy for bladder cancer. EXAM: NUCLEAR MEDICINE PET SKULL BASE TO THIGH TECHNIQUE: 13.6 mCi F-18 FDG was injected intravenously. Full-ring PET imaging was performed from the skull base to thigh after the radiotracer. CT data was obtained and used for attenuation correction and anatomic localization. Fasting blood glucose: 138 mg/dl COMPARISON:  10/27/2017 FINDINGS: Mediastinal blood pool activity: SUV max 2.5 NECK: Hypermetabolic left supraclavicular load seen on the previous study has decreased in size measuring 4 mm today compared to 11 mm previously. No residual hypermetabolism in this  lymph node on today's exam. No new cervical lymphadenopathy. Incidental CT findings: none CHEST: No hypermetabolic pulmonary nodule or mass. No hypermetabolic mediastinal or hilar lymphadenopathy. Incidental CT findings: No suspicious pulmonary nodule or mass. ABDOMEN/PELVIS: Interval development of a 10.8 x 7.1 cm fluid collection with air-fluid level in the central pelvis, tracking along and involving the sigmoid colon. Circumferential wall thickening is noted in the distal sigmoid colon just beyond this large fluid collection. The rim of the collection is hypermetabolic. There is an associated 2.3 cm focal gas collection in the root of the sigmoid mesocolon (4:67). No hypermetabolic soft tissue metastases evident in the abdomen. Hypermetabolic right pelvic lymphadenopathy on the prior study has decreased in the interval. 18 mm short axis external iliac lymph node measured previously has decreased to 12 mm short  axis. SUV max = 2.4 on today's study compared to 19.3 previously. Other lymph nodes in the same region show similar interval decrease in size and hypermetabolism. Incidental CT findings: There is abdominal aortic atherosclerosis without aneurysm. Beam hardening artifact from left hip replacement obscures inferior pelvis. Calcification associated with the right bladder wall is similar to prior. SKELETON: Diffuse FDG accumulation in the marrow space is new in the interval and presumably reflects stimulatory effects of therapy. Incidental CT findings: none IMPRESSION: 1. Interval development of a 10.8 x 7.1 cm collection of fluid and gas in the central pelvis, tracking along the sigmoid colon. Inflammatory changes track proximally in the sigmoid mesocolon where a discrete 2.3 cm gas collection is identified. Imaging features are compatible with pelvic abscess, potentially diverticular in origin. 2. Interval decrease in size and hypermetabolism of previously documented nodal disease in the left supraclavicular region and right anterior pelvic sidewall. 3. Diffuse marrow accumulation of FDG on the current study presumably related to stimulatory effects of therapy. 4.  Aortic Atherosclerois (ICD10-170.0) These results will be called to the ordering clinician or representative by the Radiologist Assistant, and communication documented in the PACS or zVision Dashboard. Electronically Signed   By: Misty Stanley M.D.   On: 12/19/2017 09:58   Dg Abd 2 Views  Result Date: 12/21/2017 CLINICAL DATA:  Constipation, delayed colonic transit. Intra-abdominal abscess with drainage catheter. EXAM: ABDOMEN - 2 VIEW COMPARISON:  Abdominal and pelvic CT scan of December 19, 2017. FINDINGS: There are loops of moderately distended gas-filled small bowel to the left of midline. Less distended small bowel loops are noted to the right of midline. There is small air in fluid levels in small bowel on the upright view. The stool and gas  pattern in the colon is within the limits of normal. There is no definite rectal gas or stool. The drainage catheter projects over the midportion of the sacrum in the midline. IMPRESSION: Bowel gas pattern compatible with an ileus. The colonic stool burden does not appear significantly increased. Electronically Signed   By: David  [place] M.D.   On: 12/21/2017 14:54   Ct Image Guided Drainage By Percutaneous Catheter  Result Date: 12/20/2017 INDICATION: 67 year old male with and unexpected intra-abdominal abscess identified on PET-CT earlier today. EXAM: CT GUIDED DRAINAGE OF  ABSCESS MEDICATIONS: The patient is currently admitted to the hospital and receiving intravenous antibiotics. The antibiotics were administered within an appropriate time frame prior to the initiation of the procedure. ANESTHESIA/SEDATION: 3 mg IV Versed 100 mcg IV Fentanyl Moderate Sedation Time:  30 minutes The patient was continuously monitored during the procedure by the interventional radiology nurse under my direct supervision. COMPLICATIONS: None immediate. TECHNIQUE: Informed  written consent was obtained from the patient after a thorough discussion of the procedural risks, benefits and alternatives. All questions were addressed. Maximal Sterile Barrier Technique was utilized including caps, mask, sterile gowns, sterile gloves, sterile drape, hand hygiene and skin antiseptic. A timeout was performed prior to the initiation of the procedure. PROCEDURE: The operative field was prepped with Chlorhexidine in a sterile fashion, and a sterile drape was applied covering the operative field. A sterile gown and sterile gloves were used for the procedure. Local anesthesia was provided with 1% Lidocaine. A planning axial CT scan was performed. The fluid and gas collection in the low anterior abdomen was successfully identified. A suitable skin entry site was selected and marked. Local anesthesia was attained by infiltration with 1% lidocaine. A  small dermatotomy was made. Under intermittent CT guidance, an 18 gauge trocar needle was carefully advanced through the anterior abdominal wall and into the fluid and gas collection. A wire was then advanced into the collection. The trocar needle was removed. The skin tract was dilated to 75 [place] and a Cook 12 [place] all-purpose drainage catheter was advanced over the wire and formed. Aspiration was then performed yielding approximately 200 mL of foul-smelling thin brown fluid. A sample was collected and sent for Gram stain and culture. Post drain placement axial CT imaging was performed which demonstrates excellent drain placement and near-total interval resolution of the abscess cavity. The drainage catheter was then connected to JP bulb suction and secured to the skin with an 0 Prolene suture and an adhesive fixation device. The patient tolerated the procedure well. FINDINGS: Approximately 200 mL foul-smelling thin brown fluid. IMPRESSION: Successful placement of a 12 French drainage catheter. PLAN: 1. Maintain drain to JP bulb suction. 2. Cultures are pending. 3. Recommend flushing drain at least once per shift. 4. Follow-up in interventional radiology clinic in 2 weeks with repeat CT scan of the pelvis with intravenous contrast to evaluate for residual/recurrent abscess and drain injection under fluoroscopy to evaluate for fistulous communication with the sigmoid colon or rectum. Signed, Criselda Peaches, MD, [facility] Electronically Signed   By: Jacqulynn Cadet M.D.   On: 12/20/2017 09:07     Subjective: Patient seen and examined at bedside.  He feels better and wants to go home.  No overnight fever, nausea, vomiting. very minimal intermittent lower abdominal pain on exertion.  Discharge Exam: Vitals:   12/22/17 2215 12/23/17 0554  BP: (!) 142/80 124/79  Pulse: 85 79  Resp: 20 20  Temp: 98.2 F (36.8 C) 98.1 F (36.7 C)   SpO2: 99% 97%   Vitals:   12/22/17 0649 12/22/17 1410 12/22/17 2215 12/23/17 0554  BP: (!) 141/85 (!) 133/91 (!) 142/80 124/79  Pulse: 83 96 85 79  Resp: $Remo'20 18 20 20  'iAiNE$ Temp: 98.6 F (37 C) 98.4 F (36.9 C) 98.2 F (36.8 C) 98.1 F (36.7 C)  TempSrc: Oral Oral Oral Oral  SpO2: 93% 100% 99% 97%  Weight:      Height:        General: Pt is alert, awake, not in acute distress Cardiovascular: rate controlled, S1/S2 + Respiratory: bilateral decreased breath sounds at bases Abdominal: Soft, ND, bowel sounds +. lower quadrant dressing present with percutaneous drain Extremities: no edema, no cyanosis    The results of significant diagnostics from this hospitalization (including imaging, microbiology, ancillary and laboratory) are listed below for reference.     Microbiology: Recent Results (from the past  240 hour(s))  Blood culture (routine x 2)     Status: None (Preliminary result)   Collection Time: 12/19/17 12:45 PM  Result Value Ref Range Status   Specimen Description   Final    BLOOD PORTA CATH Performed at [facility] [address], [place], [place] [address]    Special Requests   Final    BOTTLES DRAWN AEROBIC AND ANAEROBIC Blood Culture adequate volume Performed at [facility] [address], [place], [place] [address]    Culture   Final    NO GROWTH 4 DAYS Performed at [facility], [place] [address] [place], [place] [address]    Report Status PENDING  Incomplete  Blood culture (routine x 2)     Status: None (Preliminary result)   Collection Time: 12/19/17 12:55 PM  Result Value Ref Range Status   Specimen Description   Final    BLOOD LEFT HAND Performed at [facility] [address], [place], [place] [address]    Special Requests   Final    BOTTLES DRAWN AEROBIC AND ANAEROBIC Blood Culture results may not be optimal due to an inadequate volume of blood received in culture bottles Performed at  [facility] [address], [place], [place] [address]    Culture   Final    NO GROWTH 4 DAYS Performed at [facility], [place] [address] [place], [place] [address]    Report Status PENDING  Incomplete  Aerobic/Anaerobic Culture (surgical/deep wound)     Status: None (Preliminary result)   Collection Time: 12/19/17  5:49 PM  Result Value Ref Range Status   Specimen Description   Final    ABSCESS DIVERTICULAR Performed at [facility] [address], [place], [place] [address]    Special Requests   Final    Immunocompromised Performed at [facility], [place] [address] [place], [place] [address]    Gram Stain   Final    ABUNDANT WBC PRESENT, PREDOMINANTLY PMN ABUNDANT GRAM POSITIVE COCCI MODERATE GRAM NEGATIVE RODS    Culture   Final    FEW ESCHERICHIA COLI HOLDING FOR POSSIBLE ANAEROBE Performed at [facility], [place] [address], [place], [place] [address]    Report Status PENDING  Incomplete   Organism ID, Bacteria ESCHERICHIA COLI  Final      Susceptibility   Escherichia coli - MIC*    AMPICILLIN <=2 SENSITIVE Sensitive     CEFAZOLIN <=4 SENSITIVE Sensitive     CEFEPIME <=1 SENSITIVE Sensitive     CEFTAZIDIME <=1 SENSITIVE Sensitive     CEFTRIAXONE <=1 SENSITIVE Sensitive     CIPROFLOXACIN <=0.25 SENSITIVE Sensitive     GENTAMICIN <=1 SENSITIVE Sensitive     IMIPENEM <=0.25 SENSITIVE Sensitive     TRIMETH/SULFA <=20 SENSITIVE Sensitive     AMPICILLIN/SULBACTAM <=2 SENSITIVE Sensitive     PIP/TAZO <=4 SENSITIVE Sensitive     Extended ESBL NEGATIVE Sensitive     * FEW ESCHERICHIA COLI     Labs: BNP (last 3 results) No results for input(s): BNP in the last 8760 hours. Basic Metabolic Panel: Recent Labs  Lab 12/19/17 1256 12/20/17 1040 12/21/17 0505 12/22/17 0500 12/23/17 0524  NA 138 138 141 140 140  K 3.1* 3.1* 3.6 3.6 3.6  CL 93* 96* 100 99 98  CO2 33* 32 32 32 32  GLUCOSE 113* 186* 108*  110* 108*  BUN $Re'18 15 11 'lcp$ 12  10  CREATININE 1.04 1.10 0.95 1.00 0.98  CALCIUM 8.4* 8.0* 8.1* 8.3* 8.5*  MG  --  1.5*  --  1.4* 1.7   Liver Function Tests: Recent Labs  Lab 12/21/17 0505  AST 16  ALT 13  ALKPHOS 48  BILITOT 0.7  PROT 4.8*  ALBUMIN 2.2*   No results for input(s): LIPASE, AMYLASE in the last 168 hours. No results for input(s): AMMONIA in the last 168 hours. CBC: Recent Labs  Lab 12/19/17 1256 12/20/17 1040 12/21/17 0505 12/22/17 0500 12/23/17 0524  WBC 9.7 9.3 9.2 8.8 8.0  NEUTROABS  --   --  7.2 6.7 5.7  HGB 8.6* 7.6* 7.6* 7.6* 7.8*  HCT 26.7* 23.8* 23.5* 24.1* 24.4*  MCV 88.4 87.2 88.3 88.6 88.1  PLT 101* 110* 127* 170 191   Cardiac Enzymes: No results for input(s): CKTOTAL, CKMB, CKMBINDEX, TROPONINI in the last 168 hours. BNP: Invalid input(s): POCBNP CBG: Recent Labs  Lab 12/19/17 0746  GLUCAP 138*   D-Dimer No results for input(s): DDIMER in the last 72 hours. Hgb A1c No results for input(s): HGBA1C in the last 72 hours. Lipid Profile No results for input(s): CHOL, HDL, LDLCALC, TRIG, CHOLHDL, LDLDIRECT in the last 72 hours. Thyroid function studies No results for input(s): TSH, T4TOTAL, T3FREE, THYROIDAB in the last 72 hours.  Invalid input(s): FREET3 Anemia work up Recent Labs    12/20/17 1040  FERRITIN 700*  TIBC 150*  IRON 12*   Urinalysis    Component Value Date/Time   COLORURINE YELLOW 05/14/2017 [place] 05/14/2017 1952   LABSPEC 1.020 05/14/2017 1952   PHURINE 7.0 05/14/2017 1952   GLUCOSEU NEGATIVE 05/14/2017 1952   HGBUR NEGATIVE 05/14/2017 1952   BILIRUBINUR NEGATIVE 05/14/2017 1952   KETONESUR NEGATIVE 05/14/2017 1952   PROTEINUR NEGATIVE 05/14/2017 1952   UROBILINOGEN 0.2 01/31/2010 0624   NITRITE NEGATIVE 05/14/2017 1952   LEUKOCYTESUR NEGATIVE 05/14/2017 1952   Sepsis Labs Invalid input(s): PROCALCITONIN,  WBC,  LACTICIDVEN Microbiology Recent Results (from the past 240 hour(s))  Blood  culture (routine x 2)     Status: None (Preliminary result)   Collection Time: 12/19/17 12:45 PM  Result Value Ref Range Status   Specimen Description   Final    BLOOD PORTA CATH Performed at [facility], [place] [address], [place], [place] [address]    Special Requests   Final    BOTTLES DRAWN AEROBIC AND ANAEROBIC Blood Culture adequate volume Performed at [facility] [address] [place], [place] [address]    Culture   Final    NO GROWTH 4 DAYS Performed at [facility], [place] [address] [place], [place] [address]    Report Status PENDING  Incomplete  Blood culture (routine x 2)     Status: None (Preliminary result)   Collection Time: 12/19/17 12:55 PM  Result Value Ref Range Status   Specimen Description   Final    BLOOD LEFT HAND Performed at [facility] [address] [place], [place] [address]    Special Requests   Final    BOTTLES DRAWN AEROBIC AND ANAEROBIC Blood Culture results may not be optimal due to an inadequate volume of blood received in culture bottles Performed at [facility] [address], [place], [place] [address]    Culture   Final    NO GROWTH 4 DAYS Performed at [facility], [place] [address], [place], [place] [address]  Report Status PENDING  Incomplete  Aerobic/Anaerobic Culture (surgical/deep wound)     Status: None (Preliminary result)   Collection Time: 12/19/17  5:49 PM  Result Value Ref Range Status   Specimen Description   Final    ABSCESS DIVERTICULAR Performed at [facility] [address] [place], [place] [address]    Special Requests   Final    Immunocompromised Performed at [facility], [place] [address], [place], [place] [address]    Gram Stain   Final    ABUNDANT WBC PRESENT, PREDOMINANTLY PMN ABUNDANT GRAM POSITIVE COCCI MODERATE GRAM NEGATIVE RODS    Culture   Final    FEW ESCHERICHIA  COLI HOLDING FOR POSSIBLE ANAEROBE Performed at [facility], [place] [address] [place], [place] [address]    Report Status PENDING  Incomplete   Organism ID, Bacteria ESCHERICHIA COLI  Final      Susceptibility   Escherichia coli - MIC*    AMPICILLIN <=2 SENSITIVE Sensitive     CEFAZOLIN <=4 SENSITIVE Sensitive     CEFEPIME <=1 SENSITIVE Sensitive     CEFTAZIDIME <=1 SENSITIVE Sensitive     CEFTRIAXONE <=1 SENSITIVE Sensitive     CIPROFLOXACIN <=0.25 SENSITIVE Sensitive     GENTAMICIN <=1 SENSITIVE Sensitive     IMIPENEM <=0.25 SENSITIVE Sensitive     TRIMETH/SULFA <=20 SENSITIVE Sensitive     AMPICILLIN/SULBACTAM <=2 SENSITIVE Sensitive     PIP/TAZO <=4 SENSITIVE Sensitive     Extended ESBL NEGATIVE Sensitive     * FEW ESCHERICHIA COLI     Time coordinating discharge: 35 minutes  SIGNED:   Aline August, MD  [facility] 12/23/2017, 10:11 AM Pager: [phone]  If 7PM-7AM, please contact night-coverage [email or web address] Password [email or web address]

## 2017-12-24 LAB — CULTURE, BLOOD (ROUTINE X 2)
Culture: NO GROWTH
Culture: NO GROWTH
Special Requests: ADEQUATE

## 2017-12-24 LAB — AEROBIC/ANAEROBIC CULTURE W GRAM STAIN (SURGICAL/DEEP WOUND)

## 2017-12-24 LAB — AEROBIC/ANAEROBIC CULTURE (SURGICAL/DEEP WOUND)

## 2017-12-26 ENCOUNTER — Ambulatory Visit: Payer: PPO

## 2017-12-26 ENCOUNTER — Ambulatory Visit: Payer: PPO | Admitting: Hematology & Oncology

## 2017-12-26 ENCOUNTER — Other Ambulatory Visit: Payer: PPO

## 2017-12-26 ENCOUNTER — Other Ambulatory Visit: Payer: Self-pay | Admitting: General Surgery

## 2017-12-26 DIAGNOSIS — K651 Peritoneal abscess: Secondary | ICD-10-CM

## 2017-12-26 MED FILL — NORMAL SALINE FLUSH SYRINGE: 0.9 | 7 days supply | Qty: 140 | Fill #0

## 2017-12-27 ENCOUNTER — Ambulatory Visit: Payer: PPO

## 2017-12-29 ENCOUNTER — Inpatient Hospital Stay: Payer: PPO

## 2017-12-29 ENCOUNTER — Encounter: Payer: Self-pay | Admitting: Hematology & Oncology

## 2017-12-29 ENCOUNTER — Other Ambulatory Visit: Payer: Self-pay

## 2017-12-29 ENCOUNTER — Inpatient Hospital Stay: Payer: PPO | Attending: Hematology & Oncology | Admitting: Hematology & Oncology

## 2017-12-29 VITALS — BP 128/79 | HR 77 | Temp 98.7°F | Resp 18

## 2017-12-29 VITALS — BP 128/79 | HR 77 | Temp 98.7°F | Resp 18 | Wt 255.0 lb

## 2017-12-29 DIAGNOSIS — C679 Malignant neoplasm of bladder, unspecified: Secondary | ICD-10-CM

## 2017-12-29 DIAGNOSIS — Z5111 Encounter for antineoplastic chemotherapy: Secondary | ICD-10-CM | POA: Insufficient documentation

## 2017-12-29 DIAGNOSIS — Z95828 Presence of other vascular implants and grafts: Secondary | ICD-10-CM

## 2017-12-29 DIAGNOSIS — C772 Secondary and unspecified malignant neoplasm of intra-abdominal lymph nodes: Secondary | ICD-10-CM

## 2017-12-29 DIAGNOSIS — Z79899 Other long term (current) drug therapy: Secondary | ICD-10-CM

## 2017-12-29 DIAGNOSIS — K5780 Diverticulitis of intestine, part unspecified, with perforation and abscess without bleeding: Secondary | ICD-10-CM | POA: Diagnosis not present

## 2017-12-29 DIAGNOSIS — D509 Iron deficiency anemia, unspecified: Secondary | ICD-10-CM

## 2017-12-29 DIAGNOSIS — D508 Other iron deficiency anemias: Secondary | ICD-10-CM

## 2017-12-29 DIAGNOSIS — K5900 Constipation, unspecified: Secondary | ICD-10-CM | POA: Insufficient documentation

## 2017-12-29 LAB — CBC WITH DIFFERENTIAL (CANCER CENTER ONLY)
Basophils Absolute: 0.1 10*3/uL (ref 0.0–0.1)
Basophils Relative: 1 %
Eosinophils Absolute: 0.1 10*3/uL (ref 0.0–0.5)
Eosinophils Relative: 1 %
HCT: 29.3 % — ABNORMAL LOW (ref 38.7–49.9)
Hemoglobin: 9 g/dL — ABNORMAL LOW (ref 13.0–17.1)
Lymphocytes Relative: 24 %
Lymphs Abs: 2 10*3/uL (ref 0.9–3.3)
MCH: 28.3 pg (ref 28.0–33.4)
MCHC: 30.7 g/dL — ABNORMAL LOW (ref 32.0–35.9)
MCV: 92.1 fL (ref 82.0–98.0)
Monocytes Absolute: 0.8 10*3/uL (ref 0.1–0.9)
Monocytes Relative: 10 %
Neutro Abs: 5.3 10*3/uL (ref 1.5–6.5)
Neutrophils Relative %: 64 %
Platelet Count: 282 10*3/uL (ref 145–400)
RBC: 3.18 MIL/uL — ABNORMAL LOW (ref 4.20–5.70)
RDW: 17.9 % — ABNORMAL HIGH (ref 11.1–15.7)
WBC Count: 8.2 10*3/uL (ref 4.0–10.0)

## 2017-12-29 LAB — CMP (CANCER CENTER ONLY)
ALT: 16 U/L (ref 10–47)
AST: 29 U/L (ref 11–38)
Albumin: 3.1 g/dL — ABNORMAL LOW (ref 3.5–5.0)
Alkaline Phosphatase: 58 U/L (ref 26–84)
Anion gap: 7 (ref 5–15)
BUN: 17 mg/dL (ref 7–22)
CO2: 31 mmol/L (ref 18–33)
Calcium: 9.2 mg/dL (ref 8.0–10.3)
Chloride: 102 mmol/L (ref 98–108)
Creatinine: 1.2 mg/dL (ref 0.60–1.20)
Glucose, Bld: 186 mg/dL — ABNORMAL HIGH (ref 73–118)
Potassium: 3.8 mmol/L (ref 3.3–4.7)
Sodium: 140 mmol/L (ref 128–145)
Total Bilirubin: 0.5 mg/dL (ref 0.2–1.6)
Total Protein: 6.5 g/dL (ref 6.4–8.1)

## 2017-12-29 LAB — IRON AND TIBC
Iron: 59 ug/dL (ref 42–163)
Saturation Ratios: 24 % — ABNORMAL LOW (ref 42–163)
TIBC: 245 ug/dL (ref 202–409)
UIBC: 186 ug/dL

## 2017-12-29 LAB — FERRITIN: Ferritin: 1157 ng/mL — ABNORMAL HIGH (ref 24–336)

## 2017-12-29 MED ORDER — SODIUM CHLORIDE 0.9% FLUSH
10.0000 mL | INTRAVENOUS | Status: DC | PRN
  Administered 2017-12-29: 10 mL via INTRAVENOUS
  Filled 2017-12-29: qty 10

## 2017-12-29 MED ORDER — HEPARIN SOD (PORK) LOCK FLUSH 100 UNIT/ML IV SOLN
500.0000 [IU] | Freq: Once | INTRAVENOUS | Status: AC
  Administered 2017-12-29: 500 [IU] via INTRAVENOUS
  Filled 2017-12-29: qty 5

## 2017-12-29 NOTE — Progress Notes (Signed)
Hematology and Oncology Follow Up Visit  [name] Rose [id number] 1951/04/12 67 y.o. 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 Taxotere 80mg /m2 IV q 3 wks - s/p cycle #3 -- d/c due to progression M-VAC -- s/p cycle #2   Interim History:  [name] Rose is here today for follow-up.  Unfortunately, he was hospitalized recently.  Out of the blue, he developed a abdominal abscess.  This was a large abscess.  It was felt to be from a ruptured diverticulum.  He was admitted.  Surgery did not feel that there was a surgical indication.  Radiology placed a drain into the abscess.  This eventually grew E. coli.  He was ultimately discharged on oral antibiotics.  He is completed these.  He still has a drainage tube in.  I think he goes to see radiology next week.  He says that they have been scheduled for a MRI.  We are holding his chemotherapy until he is fully healed up from this lesion.  We did do a PET scan on him prior to when he had this abscess.  PET scan was done on 12/19/2017.  This is what how we found the abscess.  The abscess measured 10.8 x 7.1 cm.  There was decrease in size and hypermetabolism of the left supraclavicular lymph nodes and right anterior pelvic sidewall nodes.  I am glad that the chemotherapy is working.  Thankfully, he was not neutropenic when this abscess developed.  His appetite is good.  It is coming back.  He is more active now.  He is getting more strength.  I think he did have some iron while he was in the hospital.  He had iron studies done today.  His iron saturation was only 24%.  His ferritin was high which is from this abscess.  We will probably give him a dose of IV iron with his next chemotherapy.  The chemotherapy I have placed back a couple weeks so that we make sure that he is healed from  his ruptured diverticulum.  Overall, his performance status is ECOG 1.   Medications:  Allergies as of 12/29/2017   No Known Allergies     Medication List        Accurate as of 12/29/17  6:26 PM. Always use your most recent med list.          ALEVE 220 MG Caps Generic drug:  Naproxen Sodium Take 220 mg by mouth 2 (two) times daily.   amLODipine 10 MG tablet Commonly known as:  NORVASC Take 10 mg by mouth daily.   amoxicillin-clavulanate 875-125 MG tablet Commonly known as:  AUGMENTIN Take 1 tablet by mouth 2 (two) times daily for 10 days.   bisoprolol-hydrochlorothiazide 10-6.25 MG tablet Commonly known as:  ZIAC Take 1 tablet by mouth daily.   Cinnamon 500 MG Tabs Take 1 tablet by mouth 2 (two) times daily.   dexamethasone 4 MG tablet Commonly known as:  DECADRON Take 2 tablets by mouth once a day on the day after chemotherapy and then take 2 tablets two times a day for 2 days. Take with food.   gabapentin 100 MG capsule Commonly known as:  NEURONTIN TAKE 3 CAPSULES (300 MG TOTAL) BY MOUTH 2 (TWO) TIMES DAILY.   lidocaine-prilocaine cream Commonly known as:  EMLA Apply to affected area once  LORazepam 0.5 MG tablet Commonly known as:  ATIVAN Take 1 tablet (0.5 mg total) by mouth every 6 (six) hours as needed (Nausea or vomiting).   losartan 100 MG tablet Commonly known as:  COZAAR Take 100 mg by mouth daily.   MAGNESIUM PO Take 400 mg by mouth at bedtime. Leg cramps   ondansetron 8 MG tablet Commonly known as:  ZOFRAN Take 1 tablet (8 mg total) by mouth 2 (two) times daily as needed. Start on the third day after chemotherapy.   polyethylene glycol packet Commonly known as:  MIRALAX / GLYCOLAX Take 17 g by mouth 2 (two) times daily.   prochlorperazine 10 MG tablet Commonly known as:  COMPAZINE Take 1 tablet (10 mg total) by mouth every 6 (six) hours as needed (Nausea or vomiting).   senna 8.6 MG Tabs tablet Commonly known as:  SENOKOT Take 1  tablet (8.6 mg total) by mouth 2 (two) times daily.   vitamin B-6 250 MG tablet Take 1 tablet (250 mg total) by mouth daily.       Allergies: No Known Allergies  Past Medical History, Surgical history, Social history, and Family History were reviewed and updated.  Review of Systems: Review of Systems  Constitutional: Negative.   HENT: Negative.   Eyes: Negative.   Respiratory: Negative.   Cardiovascular: Negative.   Gastrointestinal: Negative.   Genitourinary: Negative.   Musculoskeletal: Negative.   Skin: Negative.   Neurological: Negative.   Endo/Heme/Allergies: Negative.   Psychiatric/Behavioral: Negative.      Physical Exam:  weight is 255 lb (115.7 kg). His oral temperature is 98.7 F (37.1 C). His blood pressure is 128/79 and his pulse is 77. His respiration is 18 and oxygen saturation is 100%.   Wt Readings from Last 3 Encounters:  12/29/17 255 lb (115.7 kg)  12/19/17 268 lb (121.6 kg)  12/05/17 267 lb (121.1 kg)    Physical Exam  Constitutional: He is oriented to person, place, and time.  HENT:  Head: Normocephalic and atraumatic.  Mouth/Throat: Oropharynx is clear and moist.  Eyes: Pupils are equal, round, and reactive to light. EOM are normal.  Neck: Normal range of motion.  Cardiovascular: Normal rate, regular rhythm and normal heart sounds.  Pulmonary/Chest: Effort normal and breath sounds normal.  Abdominal: Soft. Bowel sounds are normal.  Musculoskeletal: Normal range of motion. He exhibits no edema, tenderness or deformity.  Lymphadenopathy:    He has no cervical adenopathy.  Neurological: He is alert and oriented to person, place, and time.  Skin: Skin is warm and dry. No rash noted. No erythema.  Psychiatric: He has a normal mood and affect. His behavior is normal. Judgment and thought content normal.  Vitals reviewed.   Lab Results  Component Value Date   WBC 8.2 12/29/2017   HGB 9.0 (L) 12/29/2017   HCT 29.3 (L) 12/29/2017   MCV 92.1  12/29/2017   PLT 282 12/29/2017   Lab Results  Component Value Date   FERRITIN 1,157 (H) 12/29/2017   IRON 59 12/29/2017   TIBC 245 12/29/2017   UIBC 186 12/29/2017   IRONPCTSAT 24 (L) 12/29/2017   Lab Results  Component Value Date   RBC 3.18 (L) 12/29/2017   No results found for: KPAFRELGTCHN, LAMBDASER, KAPLAMBRATIO No results found for: IGGSERUM, IGA, IGMSERUM No results found for: TOTALPROTELP, ALBUMINELP, A1GS, A2GS, BETS, BETA2SER, GAMS, MSPIKE, SPEI   Chemistry      Component Value Date/Time   NA 140 12/29/2017 0834   NA 141  02/21/2017 0902   K 3.8 12/29/2017 0834   K 4.1 02/21/2017 0902   CL 102 12/29/2017 0834   CL 98 02/21/2017 0902   CO2 31 12/29/2017 0834   CO2 31 02/21/2017 0902   BUN 17 12/29/2017 0834   BUN 17 02/21/2017 0902   CREATININE 1.20 12/29/2017 0834   CREATININE 1.2 02/21/2017 0902      Component Value Date/Time   CALCIUM 9.2 12/29/2017 0834   CALCIUM 9.2 02/21/2017 0902   ALKPHOS 58 12/29/2017 0834   ALKPHOS 65 02/21/2017 0902   AST 29 12/29/2017 0834   ALT 16 12/29/2017 0834   ALT 18 02/21/2017 0902   BILITOT 0.5 12/29/2017 0834      Impression and Plan: Mr. Cina is a very pleasant 67 yo caucasian gentleman with metastatic high grade bladder cancer.   Again, we are going to treat him with his third cycle of treatment on September 30.  By then, he should be healed up nicely from the abscess.  The drain should be out of him.  He seems to have these unusual situations.  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  As always, Mr. Quintin is showing how resilient he is.     Volanda Napoleon, MD 9/19/20196:26 PM

## 2017-12-29 NOTE — Patient Instructions (Signed)
Implanted Port Home Guide An implanted port is a type of central line that is placed under the skin. Central lines are used to provide IV access when treatment or nutrition needs to be given through a person's veins. Implanted ports are used for long-term IV access. An implanted port may be placed because:  You need IV medicine that would be irritating to the small veins in your hands or arms.  You need long-term IV medicines, such as antibiotics.  You need IV nutrition for a long period.  You need frequent blood draws for lab tests.  You need dialysis.  Implanted ports are usually placed in the chest area, but they can also be placed in the upper arm, the abdomen, or the leg. An implanted port has two main parts:  Reservoir. The reservoir is round and will appear as a small, raised area under your skin. The reservoir is the part where a needle is inserted to give medicines or draw blood.  Catheter. The catheter is a thin, flexible tube that extends from the reservoir. The catheter is placed into a large vein. Medicine that is inserted into the reservoir goes into the catheter and then into the vein.  How will I care for my incision site? Do not get the incision site wet. Bathe or shower as directed by your health care provider. How is my port accessed? Special steps must be taken to access the port:  Before the port is accessed, a numbing cream can be placed on the skin. This helps numb the skin over the port site.  Your health care provider uses a sterile technique to access the port. ? Your health care provider must put on a mask and sterile gloves. ? The skin over your port is cleaned carefully with an antiseptic and allowed to dry. ? The port is gently pinched between sterile gloves, and a needle is inserted into the port.  Only "non-coring" port needles should be used to access the port. Once the port is accessed, a blood return should be checked. This helps ensure that the port  is in the vein and is not clogged.  If your port needs to remain accessed for a constant infusion, a clear (transparent) bandage will be placed over the needle site. The bandage and needle will need to be changed every week, or as directed by your health care provider.  Keep the bandage covering the needle clean and dry. Do not get it wet. Follow your health care provider's instructions on how to take a shower or bath while the port is accessed.  If your port does not need to stay accessed, no bandage is needed over the port.  What is flushing? Flushing helps keep the port from getting clogged. Follow your health care provider's instructions on how and when to flush the port. Ports are usually flushed with saline solution or a medicine called heparin. The need for flushing will depend on how the port is used.  If the port is used for intermittent medicines or blood draws, the port will need to be flushed: ? After medicines have been given. ? After blood has been drawn. ? As part of routine maintenance.  If a constant infusion is running, the port may not need to be flushed.  How long will my port stay implanted? The port can stay in for as long as your health care provider thinks it is needed. When it is time for the port to come out, surgery will be   done to remove it. The procedure is similar to the one performed when the port was put in. When should I seek immediate medical care? When you have an implanted port, you should seek immediate medical care if:  You notice a bad smell coming from the incision site.  You have swelling, redness, or drainage at the incision site.  You have more swelling or pain at the port site or the surrounding area.  You have a fever that is not controlled with medicine.  This information is not intended to replace advice given to you by your health care provider. Make sure you discuss any questions you have with your health care provider. Document  Released: 03/29/2005 Document Revised: 09/04/2015 Document Reviewed: 12/04/2012 Elsevier Interactive Patient Education  2017 [employer]  

## 2018-01-05 ENCOUNTER — Ambulatory Visit
Admission: RE | Admit: 2018-01-05 | Discharge: 2018-01-05 | Disposition: A | Discharge status: Home / Self Care | Payer: PPO | Source: Ambulatory Visit | Attending: Radiology | Admitting: Radiology

## 2018-01-05 ENCOUNTER — Ambulatory Visit
Admission: RE | Admit: 2018-01-05 | Discharge: 2018-01-05 | Disposition: A | Discharge status: Home / Self Care | Payer: PPO | Source: Ambulatory Visit | Attending: General Surgery | Admitting: General Surgery

## 2018-01-05 ENCOUNTER — Encounter: Payer: Self-pay | Admitting: Radiology

## 2018-01-05 DIAGNOSIS — K651 Peritoneal abscess: Secondary | ICD-10-CM | POA: Diagnosis not present

## 2018-01-05 HISTORY — PX: IR RADIOLOGIST EVAL & MGMT: IMG5224

## 2018-01-05 MED ORDER — IOPAMIDOL (ISOVUE-300) INJECTION 61%
125.0000 mL | Freq: Once | INTRAVENOUS | Status: AC | PRN
  Administered 2018-01-05: 125 mL via INTRAVENOUS

## 2018-01-05 NOTE — Progress Notes (Signed)
Chief Complaint: Follow-up pelvic abscess drain placed 12/19/17 by Dr. Laurence Ferrari  Supervising Physician: Daryll Brod  History of Present Illness: [name] Rose is a 67 y.o. male [name] a 67 y.o.malewith a past medical history significant for HTN, history of prostate cancer s/p radical prostatectomy (2008) and metastatic bladder cancer for which he is followed by [name] and being treated with M-VAC chemotherapy. Patient received outpatient PET scanon 9/9/19which showed improvement in nodal disease however there was development of a fluid and gas collection in the central pelvis which tracked along the sigmoid colon. He was sent to ED for further evaluation and consults were placed to both general surgery and IR for management. General surgery felt that there was no indication for surgical intervention at that time given patient was essentially asymptomatic. IR placed pelvic abscess drain on 9/9 without complications, culture of aspirate showed E.coli.   Patient was d/ced to home on 12/23/17 with instructions to schedule follow-up with general surgery in 3-4 weeks however he states he was under the impression that this was only if he was planning to discuss surgery which he is not.  Patient presents today for follow-up CT abdomen/pelvis with possible drain injection. He only complains of soft bowel movement today. Drain remains to JP with minimal output (5-10 cc QD) with daily flushing.    Past Medical History:  Diagnosis Date  . Arthritis   . 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 ([place]) 09/29/2016  . Bladder tumor   . Goals of care, counseling/discussion 09/30/2016  . History of prostate cancer followed by pcp dr [name]-  per pt last PSA undetectable   dx 2008-- (Stage T1c, Gleason 3+3,  PSA 4.58, vol 99cc)  s/p  radical prostatectomy (nerve sparing bilateral)   . Hypertension   . Lower urinary tract symptoms (LUTS)   . Pre-diabetes   . Wears  glasses     Past Surgical History:  Procedure Laterality Date  . CATARACT EXTRACTION W/ INTRAOCULAR LENS  IMPLANT, BILATERAL Bilateral 2011  . [place]  . IR FLUORO GUIDE PORT INSERTION RIGHT  10/07/2016  . IR US GUIDE VASC ACCESS RIGHT  10/07/2016  . KNEE ARTHROSCOPY Bilateral right 2006;  left 02-15-2007  . [place];  1990;  1983  . ROBOT ASSISTED LAPAROSCOPIC RADICAL PROSTATECTOMY  06/20/2006   bilateral nerve sparing  . TOTAL HIP ARTHROPLASTY Left 03/03/2015   Procedure: LEFT TOTAL HIP ARTHROPLASTY ANTERIOR APPROACH;  Surgeon: Dorna Leitz, MD;  Location: [facility];  Service: Orthopedics;  Laterality: Left;  . TOTAL KNEE ARTHROPLASTY Bilateral left 08-13-2009;  right 12-26-2009  . TRANSURETHRAL RESECTION OF BLADDER TUMOR N/A 09/20/2016   Procedure: TRANSURETHRAL RESECTION OF BLADDER TUMOR (TURBT);  Surgeon: Franchot Gallo, MD;  Location: [facility];  Service: Urology;  Laterality: N/A;    Allergies: Patient has no known allergies.  Medications: Prior to Admission medications   Medication Sig Start Date End Date Taking? Authorizing Provider  amLODipine (NORVASC) 10 MG tablet Take 10 mg by mouth daily.    [provider]  bisoprolol-hydrochlorothiazide (ZIAC) 10-6.25 MG tablet Take 1 tablet by mouth daily.    [provider]  Cinnamon 500 MG TABS Take 1 tablet by mouth 2 (two) times daily.     [provider]  dexamethasone (DECADRON) 4 MG tablet Take 2 tablets by mouth once a day on the day after chemotherapy and then take 2 tablets two times a day for  2 days. Take with food. 11/10/17   Volanda Napoleon, MD  gabapentin (NEURONTIN) 100 MG capsule TAKE 3 CAPSULES (300 MG TOTAL) BY MOUTH 2 (TWO) TIMES DAILY. 12/23/17   Volanda Napoleon, MD  lidocaine-prilocaine (EMLA) cream Apply to affected area once 11/10/17   Ennever, Rudell Cobb, MD  LORazepam (ATIVAN) 0.5 MG tablet Take 1 tablet (0.5 mg total) by mouth every 6  (six) hours as needed (Nausea or vomiting). 11/10/17   Volanda Napoleon, MD  losartan (COZAAR) 100 MG tablet Take 100 mg by mouth daily.    [provider]  MAGNESIUM PO Take 400 mg by mouth at bedtime. Leg cramps    [provider]  Naproxen Sodium (ALEVE) 220 MG CAPS Take 220 mg by mouth 2 (two) times daily.     [provider]  ondansetron (ZOFRAN) 8 MG tablet Take 1 tablet (8 mg total) by mouth 2 (two) times daily as needed. Start on the third day after chemotherapy. 11/10/17   Volanda Napoleon, MD  polyethylene glycol (MIRALAX / GLYCOLAX) packet Take 17 g by mouth 2 (two) times daily. 12/23/17   Aline August, MD  prochlorperazine (COMPAZINE) 10 MG tablet Take 1 tablet (10 mg total) by mouth every 6 (six) hours as needed (Nausea or vomiting). 11/10/17   Volanda Napoleon, MD  Pyridoxine HCl (VITAMIN B-6) 250 MG tablet Take 1 tablet (250 mg total) by mouth daily. 12/23/17   Aline August, MD  senna (SENOKOT) 8.6 MG TABS tablet Take 1 tablet (8.6 mg total) by mouth 2 (two) times daily. 12/23/17   Aline August, MD     Family History  Problem Relation Age of Onset  . Hypertension Mother   . Aneurysm Mother   . Emphysema Father   . Hypertension Father     Social History   Socioeconomic History  . Marital status: Married    Spouse name: Not on file  . Number of children: Not on file  . Years of education: Not on file  . Highest education level: Not on file  Occupational History  . Not on file  Social Needs  . Financial resource strain: Not on file  . Food insecurity:    Worry: Not on file    Inability: Not on file  . Transportation needs:    Medical: Not on file    Non-medical: Not on file  Tobacco Use  . Smoking status: Former Smoker    Years: 16.00    Types: Cigarettes    Last attempt to quit: 09/25/1984    Years since quitting: 33.3  . Smokeless tobacco: Never Used  Substance and Sexual Activity  . Alcohol use: Yes    Comment: occasionally  . Drug  use: No  . Sexual activity: Not on file  Lifestyle  . Physical activity:    Days per week: Not on file    Minutes per session: Not on file  . Stress: Not on file  Relationships  . Social connections:    Talks on phone: Not on file    Gets together: Not on file    Attends religious service: Not on file    Active member of club or organization: Not on file    Attends meetings of clubs or organizations: Not on file    Relationship status: Not on file  Other Topics Concern  . Not on file  Social History Narrative  . Not on file     Review of Systems: A 12 point  ROS discussed and pertinent positives are indicated in the HPI above.  All other systems are negative.  Review of Systems  Constitutional: Negative for appetite change, chills and fever.  Respiratory: Negative for cough and shortness of breath.   Cardiovascular: Negative for chest pain.  Gastrointestinal: Negative for abdominal distention, abdominal pain, nausea and vomiting.  Skin: Negative for rash.  Neurological: Negative for dizziness and syncope.  Psychiatric/Behavioral: Negative for confusion.    Vital Signs: BP (!) 142/74   Pulse 76   Temp 98.1 F (36.7 C)   SpO2 96%   Physical Exam  Constitutional: No distress.  Pulmonary/Chest: Effort normal. No respiratory distress.  Abdominal: Soft. He exhibits no distension. There is no tenderness.  Midline pelvic abscess drain to suction present; insertion site is clean, dry, intact with minimal erythema. Suture in tact. Flushes/aspirates easily.  Neurological: He is alert.  Skin: Skin is warm. He is not diaphoretic.  Psychiatric: He has a normal mood and affect. His behavior is normal.  Vitals reviewed.   Imaging: Nm Pet Image Restag (ps) Skull Base To Thigh  Result Date: 12/19/2017 CLINICAL DATA:  Subsequent treatment strategy for bladder cancer. EXAM: NUCLEAR MEDICINE PET SKULL BASE TO THIGH TECHNIQUE: 13.6 mCi F-18 FDG was injected intravenously. Full-ring PET  imaging was performed from the skull base to thigh after the radiotracer. CT data was obtained and used for attenuation correction and anatomic localization. Fasting blood glucose: 138 mg/dl COMPARISON:  10/27/2017 FINDINGS: Mediastinal blood pool activity: SUV max 2.5 NECK: Hypermetabolic left supraclavicular load seen on the previous study has decreased in size measuring 4 mm today compared to 11 mm previously. No residual hypermetabolism in this lymph node on today's exam. No new cervical lymphadenopathy. Incidental CT findings: none CHEST: No hypermetabolic pulmonary nodule or mass. No hypermetabolic mediastinal or hilar lymphadenopathy. Incidental CT findings: No suspicious pulmonary nodule or mass. ABDOMEN/PELVIS: Interval development of a 10.8 x 7.1 cm fluid collection with air-fluid level in the central pelvis, tracking along and involving the sigmoid colon. Circumferential wall thickening is noted in the distal sigmoid colon just beyond this large fluid collection. The rim of the collection is hypermetabolic. There is an associated 2.3 cm focal gas collection in the root of the sigmoid mesocolon (4:67). No hypermetabolic soft tissue metastases evident in the abdomen. Hypermetabolic right pelvic lymphadenopathy on the prior study has decreased in the interval. 18 mm short axis external iliac lymph node measured previously has decreased to 12 mm short axis. SUV max = 2.4 on today's study compared to 19.3 previously. Other lymph nodes in the same region show similar interval decrease in size and hypermetabolism. Incidental CT findings: There is abdominal aortic atherosclerosis without aneurysm. Beam hardening artifact from left hip replacement obscures inferior pelvis. Calcification associated with the right bladder wall is similar to prior. SKELETON: Diffuse FDG accumulation in the marrow space is new in the interval and presumably reflects stimulatory effects of therapy. Incidental CT findings: none  IMPRESSION: 1. Interval development of a 10.8 x 7.1 cm collection of fluid and gas in the central pelvis, tracking along the sigmoid colon. Inflammatory changes track proximally in the sigmoid mesocolon where a discrete 2.3 cm gas collection is identified. Imaging features are compatible with pelvic abscess, potentially diverticular in origin. 2. Interval decrease in size and hypermetabolism of previously documented nodal disease in the left supraclavicular region and right anterior pelvic sidewall. 3. Diffuse marrow accumulation of FDG on the current study presumably related to stimulatory effects of therapy. 4.  Aortic Atherosclerois (ICD10-170.0) These results will be called to the ordering clinician or representative by the Radiologist Assistant, and communication documented in the PACS or zVision Dashboard. Electronically Signed   By: Misty Stanley M.D.   On: 12/19/2017 09:58   Dg Abd 2 Views  Result Date: 12/21/2017 CLINICAL DATA:  Constipation, delayed colonic transit. Intra-abdominal abscess with drainage catheter. EXAM: ABDOMEN - 2 VIEW COMPARISON:  Abdominal and pelvic CT scan of December 19, 2017. FINDINGS: There are loops of moderately distended gas-filled small bowel to the left of midline. Less distended small bowel loops are noted to the right of midline. There is small air in fluid levels in small bowel on the upright view. The stool and gas pattern in the colon is within the limits of normal. There is no definite rectal gas or stool. The drainage catheter projects over the midportion of the sacrum in the midline. IMPRESSION: Bowel gas pattern compatible with an ileus. The colonic stool burden does not appear significantly increased. Electronically Signed   By: David  Martinique M.D.   On: 12/21/2017 14:54   Ct Image Guided Drainage By Percutaneous Catheter  Result Date: 12/20/2017 INDICATION: 67 year old male with and unexpected intra-abdominal abscess identified on PET-CT earlier today. EXAM:  CT GUIDED DRAINAGE OF  ABSCESS MEDICATIONS: The patient is currently admitted to the hospital and receiving intravenous antibiotics. The antibiotics were administered within an appropriate time frame prior to the initiation of the procedure. ANESTHESIA/SEDATION: 3 mg IV Versed 100 mcg IV Fentanyl Moderate Sedation Time:  30 minutes The patient was continuously monitored during the procedure by the interventional radiology nurse under my direct supervision. COMPLICATIONS: None immediate. TECHNIQUE: Informed written consent was obtained from the patient after a thorough discussion of the procedural risks, benefits and alternatives. All questions were addressed. Maximal Sterile Barrier Technique was utilized including caps, mask, sterile gowns, sterile gloves, sterile drape, hand hygiene and skin antiseptic. A timeout was performed prior to the initiation of the procedure. PROCEDURE: The operative field was prepped with Chlorhexidine in a sterile fashion, and a sterile drape was applied covering the operative field. A sterile gown and sterile gloves were used for the procedure. Local anesthesia was provided with 1% Lidocaine. A planning axial CT scan was performed. The fluid and gas collection in the low anterior abdomen was successfully identified. A suitable skin entry site was selected and marked. Local anesthesia was attained by infiltration with 1% lidocaine. A small dermatotomy was made. Under intermittent CT guidance, an 18 gauge trocar needle was carefully advanced through the anterior abdominal wall and into the fluid and gas collection. A wire was then advanced into the collection. The trocar needle was removed. The skin tract was dilated to 73 [place] and a Cook 12 [place] all-purpose drainage catheter was advanced over the wire and formed. Aspiration was then performed yielding approximately 200 mL of foul-smelling thin brown fluid. A sample was collected and sent for Gram stain and culture. Post drain  placement axial CT imaging was performed which demonstrates excellent drain placement and near-total interval resolution of the abscess cavity. The drainage catheter was then connected to JP bulb suction and secured to the skin with an 0 Prolene suture and an adhesive fixation device. The patient tolerated the procedure well. FINDINGS: Approximately 200 mL foul-smelling thin brown fluid. IMPRESSION: Successful placement of a 12 French drainage catheter. PLAN: 1. Maintain drain to JP bulb suction. 2. Cultures are pending. 3. Recommend flushing drain at least once per shift. 4. Follow-up in  interventional radiology clinic in 2 weeks with repeat CT scan of the pelvis with intravenous contrast to evaluate for residual/recurrent abscess and drain injection under fluoroscopy to evaluate for fistulous communication with the sigmoid colon or rectum. Signed, Criselda Peaches, MD, [facility]iology Electronically Signed   By: Jacqulynn Cadet M.D.   On: 12/20/2017 09:07    Labs:  CBC: Recent Labs    12/21/17 0505 12/22/17 0500 12/23/17 0524 12/29/17 0834  WBC 9.2 8.8 8.0 8.2  HGB 7.6* 7.6* 7.8* 9.0*  HCT 23.5* 24.1* 24.4* 29.3*  PLT 127* 170 191 282    COAGS: Recent Labs    04/21/17 0729 12/19/17 1256  INR 0.99 1.05    BMP: Recent Labs    12/20/17 1040 12/21/17 0505 12/22/17 0500 12/23/17 0524 12/29/17 0834  NA 138 141 140 140 140  K 3.1* 3.6 3.6 3.6 3.8  CL 96* 100 99 98 102  CO2 32 32 32 32 31  GLUCOSE 186* 108* 110* 108* 186*  BUN $Re'15 11 12 10 17  'XMR$ CALCIUM 8.0* 8.1* 8.3* 8.5* 9.2  CREATININE 1.10 0.95 1.00 0.98 1.20  GFRNONAA >60 >60 >60 >60  --   GFRAA >60 >60 >60 >60  --     LIVER FUNCTION TESTS: Recent Labs    11/10/17 0830 12/05/17 0808 12/21/17 0505 12/29/17 0834  BILITOT 0.7 0.5 0.7 0.5  AST 35 32 16 29  ALT $Re'21 23 13 16  'LeY$ ALKPHOS 68 60 48 58  PROT 6.2* 5.8* 4.8* 6.5  ALBUMIN 3.3* 3.1* 2.2* 3.1*     TUMOR MARKERS: No results for input(s): AFPTM, CEA, CA199, CHROMGRNA in the last 8760 hours.  Assessment:  Pelvic abscess drain placed 12/19/17 by Dr. Laurence Ferrari, likely secondary to ruptured diverticulum however this was not confirmed during hospital stay. Cultures grew e.coli and patient was treated with IV antibiotics in the hospital and a PO course of antibiotics after discharge which he finished a few days ago. Patient has been stable since d/c, no fevers/chills/abdominal pain, drain output has been between 5-10 cc with daily flushing, suction bulb intact on exam today.   CT abdomen/pelvis performed today and was reviewed with Dr. Annamaria Boots today who recommends removal of drain in clinic today which was done without complication. Patient given instructions to keep area clean, dry and covered until healed, limit activity which requires straining, call office for any questions or concerns.  Per hospital discharge summary patient was to follow up with general surgery 3-4 weeks from discharge for possible colectomy however he states he was not aware of this and is not interested in surgery at this time. He states he is supposed to undergo further chemotherapy for bladder cancer with [name] on Monday and he would prefer to focus on those treatments at the present time. He understands to return to ED with any concerning symptoms. All questions answered.   Will follow-up as needed.   Electronically Signed: Joaquim Nam PA-C 01/05/2018, 1:55 PM   Please refer to Dr. Fritz Pickerel attestation of this note for management and plan.

## 2018-01-09 ENCOUNTER — Inpatient Hospital Stay: Payer: PPO

## 2018-01-09 ENCOUNTER — Encounter: Payer: Self-pay | Admitting: Hematology & Oncology

## 2018-01-09 ENCOUNTER — Other Ambulatory Visit: Payer: Self-pay

## 2018-01-09 ENCOUNTER — Inpatient Hospital Stay (HOSPITAL_BASED_OUTPATIENT_CLINIC_OR_DEPARTMENT_OTHER): Payer: PPO | Admitting: Hematology & Oncology

## 2018-01-09 VITALS — BP 121/53 | HR 69 | Temp 98.4°F | Resp 18 | Wt 256.0 lb

## 2018-01-09 DIAGNOSIS — C772 Secondary and unspecified malignant neoplasm of intra-abdominal lymph nodes: Secondary | ICD-10-CM | POA: Diagnosis not present

## 2018-01-09 DIAGNOSIS — K5900 Constipation, unspecified: Secondary | ICD-10-CM | POA: Diagnosis not present

## 2018-01-09 DIAGNOSIS — K5780 Diverticulitis of intestine, part unspecified, with perforation and abscess without bleeding: Secondary | ICD-10-CM | POA: Diagnosis not present

## 2018-01-09 DIAGNOSIS — Z79899 Other long term (current) drug therapy: Secondary | ICD-10-CM | POA: Diagnosis not present

## 2018-01-09 DIAGNOSIS — C679 Malignant neoplasm of bladder, unspecified: Secondary | ICD-10-CM

## 2018-01-09 DIAGNOSIS — D508 Other iron deficiency anemias: Secondary | ICD-10-CM

## 2018-01-09 DIAGNOSIS — Z5111 Encounter for antineoplastic chemotherapy: Secondary | ICD-10-CM

## 2018-01-09 DIAGNOSIS — D509 Iron deficiency anemia, unspecified: Secondary | ICD-10-CM

## 2018-01-09 LAB — CBC WITH DIFFERENTIAL (CANCER CENTER ONLY)
Basophils Absolute: 0 10*3/uL (ref 0.0–0.1)
Basophils Relative: 1 %
Eosinophils Absolute: 0.2 10*3/uL (ref 0.0–0.5)
Eosinophils Relative: 3 %
HCT: 33.4 % — ABNORMAL LOW (ref 38.7–49.9)
Hemoglobin: 10.5 g/dL — ABNORMAL LOW (ref 13.0–17.1)
Lymphocytes Relative: 30 %
Lymphs Abs: 1.8 10*3/uL (ref 0.9–3.3)
MCH: 29.2 pg (ref 28.0–33.4)
MCHC: 31.4 g/dL — ABNORMAL LOW (ref 32.0–35.9)
MCV: 93 fL (ref 82.0–98.0)
Monocytes Absolute: 0.5 10*3/uL (ref 0.1–0.9)
Monocytes Relative: 9 %
Neutro Abs: 3.4 10*3/uL (ref 1.5–6.5)
Neutrophils Relative %: 57 %
Platelet Count: 168 10*3/uL (ref 145–400)
RBC: 3.59 MIL/uL — ABNORMAL LOW (ref 4.20–5.70)
RDW: 18.2 % — ABNORMAL HIGH (ref 11.1–15.7)
WBC Count: 6 10*3/uL (ref 4.0–10.0)

## 2018-01-09 LAB — SAMPLE TO BLOOD BANK

## 2018-01-09 LAB — CMP (CANCER CENTER ONLY)
ALT: 29 U/L (ref 10–47)
AST: 30 U/L (ref 11–38)
Albumin: 3.4 g/dL — ABNORMAL LOW (ref 3.5–5.0)
Alkaline Phosphatase: 54 U/L (ref 26–84)
Anion gap: 6 (ref 5–15)
BUN: 20 mg/dL (ref 7–22)
CO2: 32 mmol/L (ref 18–33)
Calcium: 9.6 mg/dL (ref 8.0–10.3)
Chloride: 104 mmol/L (ref 98–108)
Creatinine: 1.3 mg/dL — ABNORMAL HIGH (ref 0.60–1.20)
Glucose, Bld: 154 mg/dL — ABNORMAL HIGH (ref 73–118)
Potassium: 3.5 mmol/L (ref 3.3–4.7)
Sodium: 142 mmol/L (ref 128–145)
Total Bilirubin: 0.6 mg/dL (ref 0.2–1.6)
Total Protein: 6.4 g/dL (ref 6.4–8.1)

## 2018-01-09 LAB — IRON AND TIBC
Iron: 84 ug/dL (ref 42–163)
Saturation Ratios: 31 % — ABNORMAL LOW (ref 42–163)
TIBC: 270 ug/dL (ref 202–409)
UIBC: 186 ug/dL

## 2018-01-09 LAB — FERRITIN: Ferritin: 1043 ng/mL — ABNORMAL HIGH (ref 24–336)

## 2018-01-09 LAB — LACTATE DEHYDROGENASE: LDH: 192 U/L (ref 98–192)

## 2018-01-09 MED ORDER — HEPARIN SOD (PORK) LOCK FLUSH 100 UNIT/ML IV SOLN
500.0000 [IU] | Freq: Once | INTRAVENOUS | Status: AC | PRN
  Administered 2018-01-09: 500 [IU]
  Filled 2018-01-09: qty 5

## 2018-01-09 MED ORDER — ONDANSETRON HCL 4 MG/2ML IJ SOLN
INTRAMUSCULAR | Status: AC
  Filled 2018-01-09: qty 4

## 2018-01-09 MED ORDER — ONDANSETRON HCL 4 MG/2ML IJ SOLN
8.0000 mg | Freq: Once | INTRAMUSCULAR | Status: AC
  Administered 2018-01-09: 8 mg via INTRAVENOUS

## 2018-01-09 MED ORDER — DEXAMETHASONE SODIUM PHOSPHATE 10 MG/ML IJ SOLN
INTRAMUSCULAR | Status: AC
  Filled 2018-01-09: qty 1

## 2018-01-09 MED ORDER — SODIUM CHLORIDE 0.9 % IV SOLN
Freq: Once | INTRAVENOUS | Status: AC
  Administered 2018-01-09: 11:00:00 via INTRAVENOUS
  Filled 2018-01-09: qty 250

## 2018-01-09 MED ORDER — SODIUM CHLORIDE 0.9% FLUSH
10.0000 mL | INTRAVENOUS | Status: DC | PRN
  Administered 2018-01-09: 10 mL
  Filled 2018-01-09: qty 10

## 2018-01-09 MED ORDER — SODIUM CHLORIDE 0.9 % IV SOLN
29.9000 mg/m2 | Freq: Once | INTRAVENOUS | Status: AC
  Administered 2018-01-09: 75 mg via INTRAVENOUS
  Filled 2018-01-09: qty 3

## 2018-01-09 MED ORDER — DEXAMETHASONE SODIUM PHOSPHATE 10 MG/ML IJ SOLN
10.0000 mg | Freq: Once | INTRAMUSCULAR | Status: AC
  Administered 2018-01-09: 10 mg via INTRAVENOUS

## 2018-01-09 NOTE — Patient Instructions (Signed)
Methotrexate injection What is this medicine? METHOTREXATE (METH oh TREX ate) is a chemotherapy drug used to treat cancer including breast cancer, leukemia, and lymphoma. This medicine can also be used to treat psoriasis and certain kinds of arthritis. This medicine may be used for other purposes; ask your health care provider or pharmacist if you have questions. What should I tell my health care provider before I take this medicine? They need to know if you have any of these conditions: -fluid in the stomach area or lungs -if you often drink alcohol -infection or immune system problems -kidney disease -liver disease -low blood counts, like low white cell, platelet, or red cell counts -lung disease -radiation therapy -stomach ulcers -ulcerative colitis -an unusual or allergic reaction to methotrexate, other medicines, foods, dyes, or preservatives -pregnant or trying to get pregnant -breast-feeding How should I use this medicine? This medicine is for infusion into a vein or for injection into muscle or into the spinal fluid (whichever applies). It is usually given by a health care professional in a hospital or clinic setting. In rare cases, you might get this medicine at home. You will be taught how to give this medicine. Use exactly as directed. Take your medicine at regular intervals. Do not take your medicine more often than directed. If this medicine is used for arthritis or psoriasis, it should be taken weekly, NOT daily. It is important that you put your used needles and syringes in a special sharps container. Do not put them in a trash can. If you do not have a sharps container, call your pharmacist or healthcare provider to get one. Talk to your pediatrician regarding the use of this medicine in children. While this drug may be prescribed for children as young as 2 years for selected conditions, precautions do apply. Overdosage: If you think you have taken too much of this medicine  contact a poison control center or emergency room at once. NOTE: This medicine is only for you. Do not share this medicine with others. What if I miss a dose? It is important not to miss your dose. Call your doctor or health care professional if you are unable to keep an appointment. If you give yourself the medicine and you miss a dose, talk with your doctor or health care professional. Do not take double or extra doses. What may interact with this medicine? This medicine may interact with the following medications: -acitretin -aspirin or aspirin-like medicines including salicylates -azathioprine -certain antibiotics like chloramphenicol, penicillin, tetracycline -certain medicines for stomach problems like esomeprazole, omeprazole, pantoprazole -cyclosporine -gold -hydroxychloroquine -live virus vaccines -mercaptopurine -NSAIDs, medicines for pain and inflammation, like ibuprofen or naproxen -other cytotoxic agents -penicillamine -phenylbutazone -phenytoin -probenacid -retinoids such as isotretinoin and tretinoin -steroid medicines like prednisone or cortisone -sulfonamides like sulfasalazine and trimethoprim/sulfamethoxazole -theophylline This list may not describe all possible interactions. Give your health care provider a list of all the medicines, herbs, non-prescription drugs, or dietary supplements you use. Also tell them if you smoke, drink alcohol, or use illegal drugs. Some items may interact with your medicine. What should I watch for while using this medicine? Avoid alcoholic drinks. In some cases, you may be given additional medicines to help with side effects. Follow all directions for their use. This medicine can make you more sensitive to the sun. Keep out of the sun. If you cannot avoid being in the sun, wear protective clothing and use sunscreen. Do not use sun lamps or tanning beds/booths. You may get drowsy  or dizzy. Do not drive, use machinery, or do anything that  needs mental alertness until you know how this medicine affects you. Do not stand or sit up quickly, especially if you are an older patient. This reduces the risk of dizzy or fainting spells. You may need blood work done while you are taking this medicine. Call your doctor or health care professional for advice if you get a fever, chills or sore throat, or other symptoms of a cold or flu. Do not treat yourself. This drug decreases your body's ability to fight infections. Try to avoid being around people who are sick. This medicine may increase your risk to bruise or bleed. Call your doctor or health care professional if you notice any unusual bleeding. Check with your doctor or health care professional if you get an attack of severe diarrhea, nausea and vomiting, or if you sweat a lot. The loss of too much body fluid can make it dangerous for you to take this medicine. Talk to your doctor about your risk of cancer. You may be more at risk for certain types of cancers if you take this medicine. Both men and women must use effective birth control with this medicine. Do not become pregnant while taking this medicine or until at least 1 normal menstrual cycle has occurred after stopping it. Women should inform their doctor if they wish to become pregnant or think they might be pregnant. Men should not father a child while taking this medicine and for 3 months after stopping it. There is a potential for serious side effects to an unborn child. Talk to your health care professional or pharmacist for more information. Do not breast-feed an infant while taking this medicine. What side effects may I notice from receiving this medicine? Side effects that you should report to your doctor or health care professional as soon as possible: -allergic reactions like skin rash, itching or hives, swelling of the face, lips, or tongue -back pain -breathing problems or shortness of breath -confusion -diarrhea -dry,  nonproductive cough -low blood counts - this medicine may decrease the number of white blood cells, red blood cells and platelets. You may be at increased risk of infections and bleeding -mouth sores -redness, blistering, peeling or loosening of the skin, including inside the mouth -seizures -severe headaches -signs of infection - fever or chills, cough, sore throat, pain or difficulty passing urine -signs and symptoms of bleeding such as bloody or black, tarry stools; red or dark-brown urine; spitting up blood or brown material that looks like coffee grounds; red spots on the skin; unusual bruising or bleeding from the eye, gums, or nose -signs and symptoms of kidney injury like trouble passing urine or change in the amount of urine -signs and symptoms of liver injury like dark yellow or brown urine; general ill feeling or flu-like symptoms; light-colored stools; loss of appetite; nausea; right upper belly pain; unusually weak or tired; yellowing of the eyes or skin -stiff neck -vomiting Side effects that usually do not require medical attention (report to your doctor or health care professional if they continue or are bothersome): -dizziness -hair loss -headache -stomach pain -upset stomach This list may not describe all possible side effects. Call your doctor for medical advice about side effects. You may report side effects to FDA at 1-800-FDA-1088. Where should I keep my medicine? If you are using this medicine at home, you will be instructed on how to store this medicine. Throw away any unused medicine after  the expiration date on the label. NOTE: This sheet is a summary. It may not cover all possible information. If you have questions about this medicine, talk to your doctor, pharmacist, or health care provider.  2018 Elsevier/Gold Standard (2014-07-18 12:36:41)

## 2018-01-09 NOTE — Progress Notes (Signed)
Hematology and Oncology Follow Up Visit  [name] Rose [id number] 09-26-1950 67 y.o. 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 Taxotere 80mg /m2 IV q 3 wks - s/p cycle #3 -- d/c due to progression M-VAC -- s/p cycle #2   Interim History:  [name] Rose is here today for follow-up.  For right now, he is doing much better.  The drainage catheter in his abdominal abscess has been removed.  He did have a CT scan done prior to the removal of the catheter.  Thankfully, there was marked decrease in his retroperitoneal and iliac adenopathy.  The anterior pelvic abscess had resolved.  I am just very happy that the chemotherapy is helping and doing a good job for him.  He has had no cough or shortness of breath.  He has had no fever.  He is had no bleeding.  His biggest problem is going to be constipation he says from chemotherapy.  He says that with protocol, he really gets constipated and that the stool gets very hard.  He is taking Senokot for this.  Overall, his performance status is ECOG 1.   Medications:  Allergies as of 01/09/2018   No Known Allergies     Medication List        Accurate as of 01/09/18 10:41 AM. Always use your most recent med list.          ALEVE 220 MG Caps Generic drug:  Naproxen Sodium Take 220 mg by mouth 2 (two) times daily.   amLODipine 10 MG tablet Commonly known as:  NORVASC Take 10 mg by mouth daily.   bisoprolol-hydrochlorothiazide 10-6.25 MG tablet Commonly known as:  ZIAC Take 1 tablet by mouth daily.   Cinnamon 500 MG Tabs Take 1 tablet by mouth 2 (two) times daily.   dexamethasone 4 MG tablet Commonly known as:  DECADRON Take 2 tablets by mouth once a day on the day after chemotherapy and then take 2 tablets two times a day for 2 days. Take with food.   gabapentin 100  MG capsule Commonly known as:  NEURONTIN TAKE 3 CAPSULES (300 MG TOTAL) BY MOUTH 2 (TWO) TIMES DAILY.   lidocaine-prilocaine cream Commonly known as:  EMLA Apply to affected area once   LORazepam 0.5 MG tablet Commonly known as:  ATIVAN Take 1 tablet (0.5 mg total) by mouth every 6 (six) hours as needed (Nausea or vomiting).   losartan 100 MG tablet Commonly known as:  COZAAR Take 100 mg by mouth daily.   MAGNESIUM PO Take 400 mg by mouth at bedtime. Leg cramps   ondansetron 8 MG tablet Commonly known as:  ZOFRAN Take 1 tablet (8 mg total) by mouth 2 (two) times daily as needed. Start on the third day after chemotherapy.   polyethylene glycol packet Commonly known as:  MIRALAX / GLYCOLAX Take 17 g by mouth 2 (two) times daily.   prochlorperazine 10 MG tablet Commonly known as:  COMPAZINE Take 1 tablet (10 mg total) by mouth every 6 (six) hours as needed (Nausea or vomiting).   senna 8.6 MG Tabs tablet Commonly known as:  SENOKOT Take 1 tablet (8.6 mg total) by mouth 2 (two) times daily.   vitamin B-6 250 MG tablet Take 1 tablet (250 mg total) by mouth daily.       Allergies: No Known  Allergies  Past Medical History, Surgical history, Social history, and Family History were reviewed and updated.  Review of Systems: Review of Systems  Constitutional: Negative.   HENT: Negative.   Eyes: Negative.   Respiratory: Negative.   Cardiovascular: Negative.   Gastrointestinal: Negative.   Genitourinary: Negative.   Musculoskeletal: Negative.   Skin: Negative.   Neurological: Negative.   Endo/Heme/Allergies: Negative.   Psychiatric/Behavioral: Negative.      Physical Exam:  weight is 256 lb (116.1 kg). His oral temperature is 98.4 F (36.9 C). His blood pressure is 121/53 (abnormal) and his pulse is 69. His respiration is 18 and oxygen saturation is 97%.   Wt Readings from Last 3 Encounters:  01/09/18 256 lb (116.1 kg)  12/29/17 255 lb (115.7 kg)  12/19/17 268  lb (121.6 kg)    Physical Exam  Constitutional: He is oriented to person, place, and time.  HENT:  Head: Normocephalic and atraumatic.  Mouth/Throat: Oropharynx is clear and moist.  Eyes: Pupils are equal, round, and reactive to light. EOM are normal.  Neck: Normal range of motion.  Cardiovascular: Normal rate, regular rhythm and normal heart sounds.  Pulmonary/Chest: Effort normal and breath sounds normal.  Abdominal: Soft. Bowel sounds are normal.  Musculoskeletal: Normal range of motion. He exhibits no edema, tenderness or deformity.  Lymphadenopathy:    He has no cervical adenopathy.  Neurological: He is alert and oriented to person, place, and time.  Skin: Skin is warm and dry. No rash noted. No erythema.  Psychiatric: He has a normal mood and affect. His behavior is normal. Judgment and thought content normal.  Vitals reviewed.   Lab Results  Component Value Date   WBC 6.0 01/09/2018   HGB 10.5 (L) 01/09/2018   HCT 33.4 (L) 01/09/2018   MCV 93.0 01/09/2018   PLT 168 01/09/2018   Lab Results  Component Value Date   FERRITIN 1,157 (H) 12/29/2017   IRON 59 12/29/2017   TIBC 245 12/29/2017   UIBC 186 12/29/2017   IRONPCTSAT 24 (L) 12/29/2017   Lab Results  Component Value Date   RBC 3.59 (L) 01/09/2018   No results found for: KPAFRELGTCHN, LAMBDASER, KAPLAMBRATIO No results found for: IGGSERUM, IGA, IGMSERUM No results found for: [name], SPEI   Chemistry      Component Value Date/Time   NA 142 01/09/2018 1000   NA 141 02/21/2017 0902   K 3.5 01/09/2018 1000   K 4.1 02/21/2017 0902   CL 104 01/09/2018 1000   CL 98 02/21/2017 0902   CO2 32 01/09/2018 1000   CO2 31 02/21/2017 0902   BUN 20 01/09/2018 1000   BUN 17 02/21/2017 0902   CREATININE 1.30 (H) 01/09/2018 1000   CREATININE 1.2 02/21/2017 0902      Component Value Date/Time   CALCIUM 9.6 01/09/2018 1000   CALCIUM 9.2 02/21/2017 0902    ALKPHOS 54 01/09/2018 1000   ALKPHOS 65 02/21/2017 0902   AST 30 01/09/2018 1000   ALT 29 01/09/2018 1000   ALT 18 02/21/2017 0902   BILITOT 0.6 01/09/2018 1000      Impression and Plan: [name] Rose is a very pleasant 67 yo caucasian gentleman with metastatic high grade bladder cancer.   We will go ahead with his treatment today.  I think this will be realistic to do this now.  The CAT scan that he just had done is very reassuring.  Para graph I will plan for his fourth  cycle of chemotherapy to be done in a month.  I would like to give an extra week off because he is doing well.  After his fourth cycle of treatment, we will repeat his scans.      Volanda Napoleon, MD 9/30/201910:41 AM

## 2018-01-09 NOTE — Patient Instructions (Signed)
Implanted Port Home Guide An implanted port is a type of central line that is placed under the skin. Central lines are used to provide IV access when treatment or nutrition needs to be given through a person's veins. Implanted ports are used for long-term IV access. An implanted port may be placed because:  You need IV medicine that would be irritating to the small veins in your hands or arms.  You need long-term IV medicines, such as antibiotics.  You need IV nutrition for a long period.  You need frequent blood draws for lab tests.  You need dialysis.  Implanted ports are usually placed in the chest area, but they can also be placed in the upper arm, the abdomen, or the leg. An implanted port has two main parts:  Reservoir. The reservoir is round and will appear as a small, raised area under your skin. The reservoir is the part where a needle is inserted to give medicines or draw blood.  Catheter. The catheter is a thin, flexible tube that extends from the reservoir. The catheter is placed into a large vein. Medicine that is inserted into the reservoir goes into the catheter and then into the vein.  How will I care for my incision site? Do not get the incision site wet. Bathe or shower as directed by your health care provider. How is my port accessed? Special steps must be taken to access the port:  Before the port is accessed, a numbing cream can be placed on the skin. This helps numb the skin over the port site.  Your health care provider uses a sterile technique to access the port. ? Your health care provider must put on a mask and sterile gloves. ? The skin over your port is cleaned carefully with an antiseptic and allowed to dry. ? The port is gently pinched between sterile gloves, and a needle is inserted into the port.  Only "non-coring" port needles should be used to access the port. Once the port is accessed, a blood return should be checked. This helps ensure that the port  is in the vein and is not clogged.  If your port needs to remain accessed for a constant infusion, a clear (transparent) bandage will be placed over the needle site. The bandage and needle will need to be changed every week, or as directed by your health care provider.  Keep the bandage covering the needle clean and dry. Do not get it wet. Follow your health care provider's instructions on how to take a shower or bath while the port is accessed.  If your port does not need to stay accessed, no bandage is needed over the port.  What is flushing? Flushing helps keep the port from getting clogged. Follow your health care provider's instructions on how and when to flush the port. Ports are usually flushed with saline solution or a medicine called heparin. The need for flushing will depend on how the port is used.  If the port is used for intermittent medicines or blood draws, the port will need to be flushed: ? After medicines have been given. ? After blood has been drawn. ? As part of routine maintenance.  If a constant infusion is running, the port may not need to be flushed.  How long will my port stay implanted? The port can stay in for as long as your health care provider thinks it is needed. When it is time for the port to come out, surgery will be   done to remove it. The procedure is similar to the one performed when the port was put in. When should I seek immediate medical care? When you have an implanted port, you should seek immediate medical care if:  You notice a bad smell coming from the incision site.  You have swelling, redness, or drainage at the incision site.  You have more swelling or pain at the port site or the surrounding area.  You have a fever that is not controlled with medicine.  This information is not intended to replace advice given to you by your health care provider. Make sure you discuss any questions you have with your health care provider. Document  Released: 03/29/2005 Document Revised: 09/04/2015 Document Reviewed: 12/04/2012 Elsevier Interactive Patient Education  2017 [employer]  

## 2018-01-10 ENCOUNTER — Other Ambulatory Visit: Payer: Self-pay | Admitting: Hematology & Oncology

## 2018-01-10 ENCOUNTER — Inpatient Hospital Stay: Payer: PPO | Attending: Hematology & Oncology

## 2018-01-10 VITALS — BP 120/65 | HR 74 | Temp 98.4°F | Resp 18

## 2018-01-10 DIAGNOSIS — Z5111 Encounter for antineoplastic chemotherapy: Secondary | ICD-10-CM | POA: Insufficient documentation

## 2018-01-10 DIAGNOSIS — C679 Malignant neoplasm of bladder, unspecified: Secondary | ICD-10-CM | POA: Diagnosis not present

## 2018-01-10 DIAGNOSIS — Z79899 Other long term (current) drug therapy: Secondary | ICD-10-CM | POA: Insufficient documentation

## 2018-01-10 DIAGNOSIS — B9620 Unspecified Escherichia coli [E. coli] as the cause of diseases classified elsewhere: Secondary | ICD-10-CM | POA: Diagnosis not present

## 2018-01-10 DIAGNOSIS — C772 Secondary and unspecified malignant neoplasm of intra-abdominal lymph nodes: Secondary | ICD-10-CM

## 2018-01-10 DIAGNOSIS — K5900 Constipation, unspecified: Secondary | ICD-10-CM | POA: Insufficient documentation

## 2018-01-10 DIAGNOSIS — G629 Polyneuropathy, unspecified: Secondary | ICD-10-CM | POA: Diagnosis not present

## 2018-01-10 DIAGNOSIS — R197 Diarrhea, unspecified: Secondary | ICD-10-CM | POA: Diagnosis not present

## 2018-01-10 DIAGNOSIS — K5780 Diverticulitis of intestine, part unspecified, with perforation and abscess without bleeding: Secondary | ICD-10-CM | POA: Diagnosis not present

## 2018-01-10 MED ORDER — SODIUM CHLORIDE 0.9 % IV SOLN
Freq: Once | INTRAVENOUS | Status: AC
  Administered 2018-01-10: 11:00:00 via INTRAVENOUS
  Filled 2018-01-10: qty 5

## 2018-01-10 MED ORDER — PEGFILGRASTIM 6 MG/0.6ML ~~LOC~~ PSKT
PREFILLED_SYRINGE | SUBCUTANEOUS | Status: AC
  Filled 2018-01-10: qty 0.6

## 2018-01-10 MED ORDER — DOXORUBICIN HCL CHEMO IV INJECTION 2 MG/ML
24.0000 mg/m2 | Freq: Once | INTRAVENOUS | Status: AC
  Administered 2018-01-10: 60 mg via INTRAVENOUS
  Filled 2018-01-10: qty 30

## 2018-01-10 MED ORDER — SODIUM CHLORIDE 0.9% FLUSH
10.0000 mL | INTRAVENOUS | Status: DC | PRN
  Administered 2018-01-10: 10 mL
  Filled 2018-01-10: qty 10

## 2018-01-10 MED ORDER — SODIUM CHLORIDE 0.9 % IV SOLN
Freq: Once | INTRAVENOUS | Status: AC
  Administered 2018-01-10: 08:00:00 via INTRAVENOUS
  Filled 2018-01-10: qty 250

## 2018-01-10 MED ORDER — HEPARIN SOD (PORK) LOCK FLUSH 100 UNIT/ML IV SOLN
500.0000 [IU] | Freq: Once | INTRAVENOUS | Status: AC | PRN
  Administered 2018-01-10: 500 [IU]
  Filled 2018-01-10: qty 5

## 2018-01-10 MED ORDER — PALONOSETRON HCL INJECTION 0.25 MG/5ML
0.2500 mg | Freq: Once | INTRAVENOUS | Status: AC
  Administered 2018-01-10: 0.25 mg via INTRAVENOUS

## 2018-01-10 MED ORDER — PEGFILGRASTIM 6 MG/0.6ML ~~LOC~~ PSKT
6.0000 mg | PREFILLED_SYRINGE | Freq: Once | SUBCUTANEOUS | Status: AC
  Administered 2018-01-10: 6 mg via SUBCUTANEOUS

## 2018-01-10 MED ORDER — POTASSIUM CHLORIDE 2 MEQ/ML IV SOLN
Freq: Once | INTRAVENOUS | Status: AC
  Administered 2018-01-10: 08:00:00 via INTRAVENOUS
  Filled 2018-01-10: qty 10

## 2018-01-10 MED ORDER — PALONOSETRON HCL INJECTION 0.25 MG/5ML
INTRAVENOUS | Status: AC
  Filled 2018-01-10: qty 5

## 2018-01-10 MED ORDER — SODIUM CHLORIDE 0.9 % IV SOLN
56.0000 mg/m2 | Freq: Once | INTRAVENOUS | Status: AC
  Administered 2018-01-10: 141 mg via INTRAVENOUS
  Filled 2018-01-10: qty 100

## 2018-01-10 MED ORDER — VINBLASTINE SULFATE CHEMO INJECTION 1 MG/ML
2.0000 mg/m2 | Freq: Once | INTRAVENOUS | Status: AC
  Administered 2018-01-10: 5 mg via INTRAVENOUS
  Filled 2018-01-10: qty 5

## 2018-01-10 NOTE — Patient Instructions (Signed)
[facility] Discharge Instructions for Patients Receiving Chemotherapy  Today you received the following chemotherapy agents:  Velban, Adriamycin and Cisplatin  To help prevent nausea and vomiting after your treatment, we encourage you to take your nausea medication as ordered per MD.    If you develop nausea and vomiting that is not controlled by your nausea medication, call the clinic.   BELOW ARE SYMPTOMS THAT SHOULD BE REPORTED IMMEDIATELY:  *FEVER GREATER THAN 100.5 F  *CHILLS WITH OR WITHOUT FEVER  NAUSEA AND VOMITING THAT IS NOT CONTROLLED WITH YOUR NAUSEA MEDICATION  *UNUSUAL SHORTNESS OF BREATH  *UNUSUAL BRUISING OR BLEEDING  TENDERNESS IN MOUTH AND THROAT WITH OR WITHOUT PRESENCE OF ULCERS  *URINARY PROBLEMS  *BOWEL PROBLEMS  UNUSUAL RASH Items with * indicate a potential emergency and should be followed up as soon as possible.  Feel free to call the clinic should you have any questions or concerns. The clinic phone number is [phone].  Please show the [place] at check-in to the Emergency Department and triage nurse.

## 2018-01-10 NOTE — Progress Notes (Signed)
[name] would like patient to receive vinblastine 5 mg dose as with last cycle.  I will change the order to reflect this per his V.O.

## 2018-01-11 ENCOUNTER — Other Ambulatory Visit: Payer: Self-pay | Admitting: *Deleted

## 2018-01-11 DIAGNOSIS — C679 Malignant neoplasm of bladder, unspecified: Secondary | ICD-10-CM

## 2018-01-11 DIAGNOSIS — C772 Secondary and unspecified malignant neoplasm of intra-abdominal lymph nodes: Secondary | ICD-10-CM

## 2018-01-16 ENCOUNTER — Ambulatory Visit: Payer: PPO

## 2018-01-16 ENCOUNTER — Other Ambulatory Visit: Payer: PPO

## 2018-01-16 ENCOUNTER — Ambulatory Visit: Payer: PPO | Admitting: Hematology & Oncology

## 2018-01-17 ENCOUNTER — Ambulatory Visit: Payer: PPO

## 2018-01-30 ENCOUNTER — Other Ambulatory Visit: Payer: PPO

## 2018-01-30 ENCOUNTER — Ambulatory Visit: Payer: PPO

## 2018-01-30 ENCOUNTER — Ambulatory Visit: Payer: PPO | Admitting: Family

## 2018-01-31 ENCOUNTER — Ambulatory Visit: Payer: PPO

## 2018-02-06 ENCOUNTER — Encounter: Payer: Self-pay | Admitting: Family

## 2018-02-06 ENCOUNTER — Inpatient Hospital Stay (HOSPITAL_BASED_OUTPATIENT_CLINIC_OR_DEPARTMENT_OTHER): Payer: PPO | Admitting: Family

## 2018-02-06 ENCOUNTER — Inpatient Hospital Stay: Payer: PPO

## 2018-02-06 ENCOUNTER — Other Ambulatory Visit: Payer: Self-pay

## 2018-02-06 VITALS — BP 140/87 | HR 72 | Temp 97.8°F | Resp 18 | Wt 260.8 lb

## 2018-02-06 DIAGNOSIS — C679 Malignant neoplasm of bladder, unspecified: Secondary | ICD-10-CM | POA: Diagnosis not present

## 2018-02-06 DIAGNOSIS — G629 Polyneuropathy, unspecified: Secondary | ICD-10-CM

## 2018-02-06 DIAGNOSIS — Z79899 Other long term (current) drug therapy: Secondary | ICD-10-CM

## 2018-02-06 DIAGNOSIS — R197 Diarrhea, unspecified: Secondary | ICD-10-CM

## 2018-02-06 DIAGNOSIS — Z5111 Encounter for antineoplastic chemotherapy: Secondary | ICD-10-CM | POA: Diagnosis not present

## 2018-02-06 DIAGNOSIS — C772 Secondary and unspecified malignant neoplasm of intra-abdominal lymph nodes: Secondary | ICD-10-CM

## 2018-02-06 DIAGNOSIS — K5900 Constipation, unspecified: Secondary | ICD-10-CM

## 2018-02-06 DIAGNOSIS — D508 Other iron deficiency anemias: Secondary | ICD-10-CM

## 2018-02-06 DIAGNOSIS — B9620 Unspecified Escherichia coli [E. coli] as the cause of diseases classified elsewhere: Secondary | ICD-10-CM | POA: Diagnosis not present

## 2018-02-06 DIAGNOSIS — K5780 Diverticulitis of intestine, part unspecified, with perforation and abscess without bleeding: Secondary | ICD-10-CM | POA: Diagnosis not present

## 2018-02-06 LAB — CBC WITH DIFFERENTIAL (CANCER CENTER ONLY)
Abs Immature Granulocytes: 0.08 10*3/uL — ABNORMAL HIGH (ref 0.00–0.07)
Basophils Absolute: 0.1 10*3/uL (ref 0.0–0.1)
Basophils Relative: 1 %
Eosinophils Absolute: 0 10*3/uL (ref 0.0–0.5)
Eosinophils Relative: 1 %
HCT: 32.6 % — ABNORMAL LOW (ref 39.0–52.0)
Hemoglobin: 10.2 g/dL — ABNORMAL LOW (ref 13.0–17.0)
Immature Granulocytes: 1 %
Lymphocytes Relative: 32 %
Lymphs Abs: 2 10*3/uL (ref 0.7–4.0)
MCH: 29.6 pg (ref 26.0–34.0)
MCHC: 31.3 g/dL (ref 30.0–36.0)
MCV: 94.5 fL (ref 80.0–100.0)
Monocytes Absolute: 0.8 10*3/uL (ref 0.1–1.0)
Monocytes Relative: 12 %
Neutro Abs: 3.3 10*3/uL (ref 1.7–7.7)
Neutrophils Relative %: 53 %
Platelet Count: 233 10*3/uL (ref 150–400)
RBC: 3.45 MIL/uL — ABNORMAL LOW (ref 4.22–5.81)
RDW: 16.6 % — ABNORMAL HIGH (ref 11.5–15.5)
WBC Count: 6.3 10*3/uL (ref 4.0–10.5)
nRBC: 0 % (ref 0.0–0.2)

## 2018-02-06 LAB — IRON AND TIBC
Iron: 92 ug/dL (ref 42–163)
Saturation Ratios: 37 % — ABNORMAL LOW (ref 42–163)
TIBC: 248 ug/dL (ref 202–409)
UIBC: 155 ug/dL

## 2018-02-06 LAB — CMP (CANCER CENTER ONLY)
ALT: 21 U/L (ref 10–47)
AST: 28 U/L (ref 11–38)
Albumin: 3.4 g/dL — ABNORMAL LOW (ref 3.5–5.0)
Alkaline Phosphatase: 65 U/L (ref 26–84)
Anion gap: 4 — ABNORMAL LOW (ref 5–15)
BUN: 18 mg/dL (ref 7–22)
CO2: 29 mmol/L (ref 18–33)
Calcium: 9 mg/dL (ref 8.0–10.3)
Chloride: 104 mmol/L (ref 98–108)
Creatinine: 1.4 mg/dL — ABNORMAL HIGH (ref 0.60–1.20)
Glucose, Bld: 194 mg/dL — ABNORMAL HIGH (ref 73–118)
Potassium: 3.6 mmol/L (ref 3.3–4.7)
Sodium: 137 mmol/L (ref 128–145)
Total Bilirubin: 0.6 mg/dL (ref 0.2–1.6)
Total Protein: 6.1 g/dL — ABNORMAL LOW (ref 6.4–8.1)

## 2018-02-06 LAB — LACTATE DEHYDROGENASE: LDH: 202 U/L — ABNORMAL HIGH (ref 98–192)

## 2018-02-06 LAB — FERRITIN: Ferritin: 861 ng/mL — ABNORMAL HIGH (ref 24–336)

## 2018-02-06 MED ORDER — DEXAMETHASONE SODIUM PHOSPHATE 10 MG/ML IJ SOLN
10.0000 mg | Freq: Once | INTRAMUSCULAR | Status: AC
  Administered 2018-02-06: 10 mg via INTRAVENOUS

## 2018-02-06 MED ORDER — SODIUM CHLORIDE 0.9 % IV SOLN
29.9000 mg/m2 | Freq: Once | INTRAVENOUS | Status: AC
  Administered 2018-02-06: 75 mg via INTRAVENOUS
  Filled 2018-02-06: qty 3

## 2018-02-06 MED ORDER — SODIUM CHLORIDE 0.9% FLUSH
10.0000 mL | INTRAVENOUS | Status: DC | PRN
  Administered 2018-02-06: 10 mL
  Filled 2018-02-06: qty 10

## 2018-02-06 MED ORDER — ONDANSETRON HCL 4 MG/2ML IJ SOLN
8.0000 mg | Freq: Once | INTRAMUSCULAR | Status: AC
  Administered 2018-02-06: 8 mg via INTRAVENOUS

## 2018-02-06 MED ORDER — HEPARIN SOD (PORK) LOCK FLUSH 100 UNIT/ML IV SOLN
500.0000 [IU] | Freq: Once | INTRAVENOUS | Status: AC | PRN
  Administered 2018-02-06: 500 [IU]
  Filled 2018-02-06: qty 5

## 2018-02-06 MED ORDER — DEXAMETHASONE SODIUM PHOSPHATE 10 MG/ML IJ SOLN
INTRAMUSCULAR | Status: AC
  Filled 2018-02-06: qty 1

## 2018-02-06 MED ORDER — ONDANSETRON HCL 4 MG/2ML IJ SOLN
INTRAMUSCULAR | Status: AC
  Filled 2018-02-06: qty 4

## 2018-02-06 MED ORDER — SODIUM CHLORIDE 0.9 % IV SOLN
Freq: Once | INTRAVENOUS | Status: AC
  Administered 2018-02-06: 10:00:00 via INTRAVENOUS
  Filled 2018-02-06: qty 250

## 2018-02-06 NOTE — Progress Notes (Signed)
Hematology and Oncology Follow Up Visit  [name] Rose [id number] [birth year] 67 y.o. 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 Taxotere 80mg /m2 IV q 3 wks - s/p cycle #3 - d/c due to progression  Current Therapy:   M-VAC - s/p cycle 3   Interim History: [name] Rose is here today for follow-up and treatment. He is doing well and is still golfing several times a week. He states that he has some fatigue and loss of appetite for a week after treatment and then it resolves.  No fever, chills, n/v, cough, rash, dizziness, SOB, chest pain, palpitations, abdominal pain or changes in bowel or bladder habits.  He has issues with constipation and diarrhea at times. He takes medicine for the constipation as needed sparingly and will sometimes develop the diarrhea as a result.  No episodes of bleeding, no bruising or petechiae.  The neuropathy in his hands and feet is unchanged. He takes his Gabapentin BID as prescribed. Puffiness in his feet comes and goes. This is stable.  No lymphadenopathy noted on exam.  He has a good appetite at this time but states that he needs to hydrate better. His weight is stable.   ECOG Performance Status: 1 - Symptomatic but completely ambulatory  Medications:  Allergies as of 02/06/2018   No Known Allergies     Medication List        Accurate as of 02/06/18  8:52 AM. Always use your most recent med list.          ALEVE 220 MG Caps Generic drug:  Naproxen Sodium Take 220 mg by mouth 2 (two) times daily.   amLODipine 10 MG tablet Commonly known as:  NORVASC Take 10 mg by mouth daily.   bisoprolol-hydrochlorothiazide 10-6.25 MG tablet Commonly known as:  ZIAC Take 1 tablet by mouth daily.   Cinnamon 500 MG Tabs Take 1 tablet by mouth 2 (two) times daily.   dexamethasone 4 MG  tablet Commonly known as:  DECADRON Take 2 tablets by mouth once a day on the day after chemotherapy and then take 2 tablets two times a day for 2 days. Take with food.   gabapentin 100 MG capsule Commonly known as:  NEURONTIN TAKE 3 CAPSULES (300 MG TOTAL) BY MOUTH 2 (TWO) TIMES DAILY.   lidocaine-prilocaine cream Commonly known as:  EMLA Apply to affected area once   LORazepam 0.5 MG tablet Commonly known as:  ATIVAN TAKE 1 TABLET (0.5 MG TOTAL) BY MOUTH EVERY 6 (SIX) HOURS AS NEEDED (NAUSEA OR VOMITING).   losartan 100 MG tablet Commonly known as:  COZAAR Take 100 mg by mouth daily.   MAGNESIUM PO Take 400 mg by mouth at bedtime. Leg cramps   ondansetron 8 MG tablet Commonly known as:  ZOFRAN Take 1 tablet (8 mg total) by mouth 2 (two) times daily as needed. Start on the third day after chemotherapy.   polyethylene glycol packet Commonly known as:  MIRALAX / GLYCOLAX Take 17 g by mouth 2 (two) times daily.   prochlorperazine 10 MG tablet Commonly known as:  COMPAZINE Take 1 tablet (10 mg total) by mouth every 6 (six) hours as needed (Nausea or vomiting).   senna 8.6 MG Tabs tablet Commonly known as:  SENOKOT Take 1 tablet (8.6 mg total) by mouth 2 (two) times daily.   vitamin B-6  250 MG tablet Take 1 tablet (250 mg total) by mouth daily.       Allergies: No Known Allergies  Past Medical History, Surgical history, Social history, and Family History were reviewed and updated.  Review of Systems: All other 10 point review of systems is negative.   Physical Exam:  weight is 260 lb 12 oz (118.3 kg). His oral temperature is 97.8 F (36.6 C). His blood pressure is 140/87 and his pulse is 72. His respiration is 18 and oxygen saturation is 99%.   Wt Readings from Last 3 Encounters:  02/06/18 260 lb 12 oz (118.3 kg)  01/09/18 256 lb (116.1 kg)  12/29/17 255 lb (115.7 kg)    Ocular: Sclerae unicteric, pupils equal, round and reactive to light Ear-nose-throat:  Oropharynx clear, dentition fair Lymphatic: No cervical, supraclavicular or axillary adenopathy Lungs no rales or rhonchi, good excursion bilaterally Heart regular rate and rhythm, no murmur appreciated Abd soft, nontender, positive bowel sounds, no liver or spleen tip palpated on exam, no fluid wave  MSK no focal spinal tenderness, no joint edema Neuro: non-focal, well-oriented, appropriate affect Breasts: Deferred  Lab Results  Component Value Date   WBC 6.0 01/09/2018   HGB 10.5 (L) 01/09/2018   HCT 33.4 (L) 01/09/2018   MCV 93.0 01/09/2018   PLT 168 01/09/2018   Lab Results  Component Value Date   FERRITIN 1,043 (H) 01/09/2018   IRON 84 01/09/2018   TIBC 270 01/09/2018   UIBC 186 01/09/2018   IRONPCTSAT 31 (L) 01/09/2018   Lab Results  Component Value Date   RBC 3.59 (L) 01/09/2018   No results found for: KPAFRELGTCHN, LAMBDASER, KAPLAMBRATIO No results found for: IGGSERUM, IGA, IGMSERUM No results found for: Kathrynn Ducking, MSPIKE, SPEI   Chemistry      Component Value Date/Time   NA 142 01/09/2018 1000   NA 141 02/21/2017 0902   K 3.5 01/09/2018 1000   K 4.1 02/21/2017 0902   CL 104 01/09/2018 1000   CL 98 02/21/2017 0902   CO2 32 01/09/2018 1000   CO2 31 02/21/2017 0902   BUN 20 01/09/2018 1000   BUN 17 02/21/2017 0902   CREATININE 1.30 (H) 01/09/2018 1000   CREATININE 1.2 02/21/2017 0902      Component Value Date/Time   CALCIUM 9.6 01/09/2018 1000   CALCIUM 9.2 02/21/2017 0902   ALKPHOS 54 01/09/2018 1000   ALKPHOS 65 02/21/2017 0902   AST 30 01/09/2018 1000   ALT 29 01/09/2018 1000   ALT 18 02/21/2017 0902   BILITOT 0.6 01/09/2018 1000      Impression and Plan: [name] Rose is a very pleasant 67 yo caucasian gentleman with metastatic high grade bladder cancer.  We will proceed with cycle 4 of treatment today as planned.  We will repeat scans in another 2-3 weeks and see him back in 1 month for  follow-up.  He will contact our office with any questions or concerns. We can certainly see him sooner if need be.   Laverna Peace, NP 10/28/20198:52 AM

## 2018-02-06 NOTE — Patient Instructions (Signed)
Implanted Port Home Guide An implanted port is a type of central line that is placed under the skin. Central lines are used to provide IV access when treatment or nutrition needs to be given through a person's veins. Implanted ports are used for long-term IV access. An implanted port may be placed because:  You need IV medicine that would be irritating to the small veins in your hands or arms.  You need long-term IV medicines, such as antibiotics.  You need IV nutrition for a long period.  You need frequent blood draws for lab tests.  You need dialysis.  Implanted ports are usually placed in the chest area, but they can also be placed in the upper arm, the abdomen, or the leg. An implanted port has two main parts:  Reservoir. The reservoir is round and will appear as a small, raised area under your skin. The reservoir is the part where a needle is inserted to give medicines or draw blood.  Catheter. The catheter is a thin, flexible tube that extends from the reservoir. The catheter is placed into a large vein. Medicine that is inserted into the reservoir goes into the catheter and then into the vein.  How will I care for my incision site? Do not get the incision site wet. Bathe or shower as directed by your health care provider. How is my port accessed? Special steps must be taken to access the port:  Before the port is accessed, a numbing cream can be placed on the skin. This helps numb the skin over the port site.  Your health care provider uses a sterile technique to access the port. ? Your health care provider must put on a mask and sterile gloves. ? The skin over your port is cleaned carefully with an antiseptic and allowed to dry. ? The port is gently pinched between sterile gloves, and a needle is inserted into the port.  Only "non-coring" port needles should be used to access the port. Once the port is accessed, a blood return should be checked. This helps ensure that the port  is in the vein and is not clogged.  If your port needs to remain accessed for a constant infusion, a clear (transparent) bandage will be placed over the needle site. The bandage and needle will need to be changed every week, or as directed by your health care provider.  Keep the bandage covering the needle clean and dry. Do not get it wet. Follow your health care provider's instructions on how to take a shower or bath while the port is accessed.  If your port does not need to stay accessed, no bandage is needed over the port.  What is flushing? Flushing helps keep the port from getting clogged. Follow your health care provider's instructions on how and when to flush the port. Ports are usually flushed with saline solution or a medicine called heparin. The need for flushing will depend on how the port is used.  If the port is used for intermittent medicines or blood draws, the port will need to be flushed: ? After medicines have been given. ? After blood has been drawn. ? As part of routine maintenance.  If a constant infusion is running, the port may not need to be flushed.  How long will my port stay implanted? The port can stay in for as long as your health care provider thinks it is needed. When it is time for the port to come out, surgery will be   done to remove it. The procedure is similar to the one performed when the port was put in. When should I seek immediate medical care? When you have an implanted port, you should seek immediate medical care if:  You notice a bad smell coming from the incision site.  You have swelling, redness, or drainage at the incision site.  You have more swelling or pain at the port site or the surrounding area.  You have a fever that is not controlled with medicine.  This information is not intended to replace advice given to you by your health care provider. Make sure you discuss any questions you have with your health care provider. Document  Released: 03/29/2005 Document Revised: 09/04/2015 Document Reviewed: 12/04/2012 Elsevier Interactive Patient Education  2017 [employer]  

## 2018-02-06 NOTE — Patient Instructions (Signed)
[facility] Discharge Instructions for Patients Receiving Chemotherapy  Today you received the following chemotherapy agents Methotrexate To help prevent nausea and vomiting after your treatment, we encourage you to take your nausea medication as prescribed.   If you develop nausea and vomiting that is not controlled by your nausea medication, call the clinic.   BELOW ARE SYMPTOMS THAT SHOULD BE REPORTED IMMEDIATELY:  *FEVER GREATER THAN 100.5 F  *CHILLS WITH OR WITHOUT FEVER  NAUSEA AND VOMITING THAT IS NOT CONTROLLED WITH YOUR NAUSEA MEDICATION  *UNUSUAL SHORTNESS OF BREATH  *UNUSUAL BRUISING OR BLEEDING  TENDERNESS IN MOUTH AND THROAT WITH OR WITHOUT PRESENCE OF ULCERS  *URINARY PROBLEMS  *BOWEL PROBLEMS  UNUSUAL RASH Items with * indicate a potential emergency and should be followed up as soon as possible.  Feel free to call the clinic should you have any questions or concerns. The clinic phone number is [phone].  Please show the [facility] at check-in to the Emergency Department and triage nurse.

## 2018-02-07 ENCOUNTER — Inpatient Hospital Stay: Payer: PPO

## 2018-02-07 VITALS — BP 129/66 | HR 76 | Temp 98.2°F | Resp 18

## 2018-02-07 DIAGNOSIS — C772 Secondary and unspecified malignant neoplasm of intra-abdominal lymph nodes: Secondary | ICD-10-CM

## 2018-02-07 DIAGNOSIS — Z5111 Encounter for antineoplastic chemotherapy: Secondary | ICD-10-CM | POA: Diagnosis not present

## 2018-02-07 DIAGNOSIS — C679 Malignant neoplasm of bladder, unspecified: Secondary | ICD-10-CM

## 2018-02-07 MED ORDER — PEGFILGRASTIM 6 MG/0.6ML ~~LOC~~ PSKT
PREFILLED_SYRINGE | SUBCUTANEOUS | Status: AC
  Filled 2018-02-07: qty 0.6

## 2018-02-07 MED ORDER — HEPARIN SOD (PORK) LOCK FLUSH 100 UNIT/ML IV SOLN
500.0000 [IU] | Freq: Once | INTRAVENOUS | Status: AC | PRN
  Administered 2018-02-07: 500 [IU]
  Filled 2018-02-07: qty 5

## 2018-02-07 MED ORDER — SODIUM CHLORIDE 0.9% FLUSH
10.0000 mL | INTRAVENOUS | Status: DC | PRN
  Administered 2018-02-07: 10 mL
  Filled 2018-02-07: qty 10

## 2018-02-07 MED ORDER — ALTEPLASE 2 MG IJ SOLR
2.0000 mg | Freq: Once | INTRAMUSCULAR | Status: DC | PRN
  Filled 2018-02-07: qty 2

## 2018-02-07 MED ORDER — DOXORUBICIN HCL CHEMO IV INJECTION 2 MG/ML
24.0000 mg/m2 | Freq: Once | INTRAVENOUS | Status: AC
  Administered 2018-02-07: 60 mg via INTRAVENOUS
  Filled 2018-02-07: qty 30

## 2018-02-07 MED ORDER — POTASSIUM CHLORIDE 2 MEQ/ML IV SOLN
Freq: Once | INTRAVENOUS | Status: AC
  Administered 2018-02-07: 10:00:00 via INTRAVENOUS
  Filled 2018-02-07: qty 10

## 2018-02-07 MED ORDER — SODIUM CHLORIDE 0.9% FLUSH
3.0000 mL | INTRAVENOUS | Status: DC | PRN
  Filled 2018-02-07: qty 10

## 2018-02-07 MED ORDER — VINBLASTINE SULFATE CHEMO INJECTION 1 MG/ML
2.0000 mg/m2 | Freq: Once | INTRAVENOUS | Status: AC
  Administered 2018-02-07: 5 mg via INTRAVENOUS
  Filled 2018-02-07: qty 5

## 2018-02-07 MED ORDER — SODIUM CHLORIDE 0.9 % IV SOLN
56.0000 mg/m2 | Freq: Once | INTRAVENOUS | Status: AC
  Administered 2018-02-07: 141 mg via INTRAVENOUS
  Filled 2018-02-07: qty 100

## 2018-02-07 MED ORDER — HEPARIN SOD (PORK) LOCK FLUSH 100 UNIT/ML IV SOLN
250.0000 [IU] | Freq: Once | INTRAVENOUS | Status: DC | PRN
  Filled 2018-02-07: qty 5

## 2018-02-07 MED ORDER — SODIUM CHLORIDE 0.9 % IV SOLN
Freq: Once | INTRAVENOUS | Status: AC
  Administered 2018-02-07: 12:00:00 via INTRAVENOUS
  Filled 2018-02-07: qty 5

## 2018-02-07 MED ORDER — SODIUM CHLORIDE 0.9 % IV SOLN
Freq: Once | INTRAVENOUS | Status: AC
  Administered 2018-02-07: 10:00:00 via INTRAVENOUS
  Filled 2018-02-07: qty 250

## 2018-02-07 MED ORDER — PALONOSETRON HCL INJECTION 0.25 MG/5ML
0.2500 mg | Freq: Once | INTRAVENOUS | Status: AC
  Administered 2018-02-07: 0.25 mg via INTRAVENOUS

## 2018-02-07 MED ORDER — PEGFILGRASTIM 6 MG/0.6ML ~~LOC~~ PSKT
6.0000 mg | PREFILLED_SYRINGE | Freq: Once | SUBCUTANEOUS | Status: AC
  Administered 2018-02-07: 6 mg via SUBCUTANEOUS

## 2018-02-07 MED ORDER — PALONOSETRON HCL INJECTION 0.25 MG/5ML
INTRAVENOUS | Status: AC
  Filled 2018-02-07: qty 5

## 2018-02-07 NOTE — Patient Instructions (Signed)
[facility] Discharge Instructions for Patients Receiving Chemotherapy  Today you received the following chemotherapy agents doxorubicin, velban, cisplatin.  To help prevent nausea and vomiting after your treatment, we encourage you to take your nausea medication as indicated by your MD.   If you develop nausea and vomiting that is not controlled by your nausea medication, call the clinic.   BELOW ARE SYMPTOMS THAT SHOULD BE REPORTED IMMEDIATELY:  *FEVER GREATER THAN 100.5 F  *CHILLS WITH OR WITHOUT FEVER  NAUSEA AND VOMITING THAT IS NOT CONTROLLED WITH YOUR NAUSEA MEDICATION  *UNUSUAL SHORTNESS OF BREATH  *UNUSUAL BRUISING OR BLEEDING  TENDERNESS IN MOUTH AND THROAT WITH OR WITHOUT PRESENCE OF ULCERS  *URINARY PROBLEMS  *BOWEL PROBLEMS  UNUSUAL RASH Items with * indicate a potential emergency and should be followed up as soon as possible.  Feel free to call the clinic should you have any questions or concerns. The clinic phone number is [phone].  Please show the [facility] at check-in to the Emergency Department and triage nurse.

## 2018-02-07 NOTE — Progress Notes (Signed)
To administer post cisplatin fluids over one hour instead of two hours per Laverna Peace, NP.

## 2018-03-02 ENCOUNTER — Encounter (HOSPITAL_COMMUNITY)
Admission: RE | Admit: 2018-03-02 | Discharge: 2018-03-02 | Disposition: A | Discharge status: Home / Self Care | Payer: PPO | Source: Ambulatory Visit | Attending: Family | Admitting: Family

## 2018-03-02 DIAGNOSIS — C772 Secondary and unspecified malignant neoplasm of intra-abdominal lymph nodes: Secondary | ICD-10-CM | POA: Diagnosis not present

## 2018-03-02 DIAGNOSIS — C679 Malignant neoplasm of bladder, unspecified: Secondary | ICD-10-CM | POA: Insufficient documentation

## 2018-03-02 LAB — GLUCOSE, CAPILLARY: Glucose-Capillary: 113 mg/dL — ABNORMAL HIGH (ref 70–99)

## 2018-03-02 MED ORDER — FLUDEOXYGLUCOSE F - 18 (FDG) INJECTION
13.0800 | Freq: Once | INTRAVENOUS | Status: AC | PRN
  Administered 2018-03-02: 13.08 via INTRAVENOUS

## 2018-03-06 ENCOUNTER — Inpatient Hospital Stay: Payer: PPO

## 2018-03-06 ENCOUNTER — Inpatient Hospital Stay: Payer: PPO | Admitting: Hematology & Oncology

## 2018-03-06 ENCOUNTER — Telehealth: Payer: Self-pay | Admitting: Hematology & Oncology

## 2018-03-06 NOTE — Telephone Encounter (Signed)
I have tried several times to contact patient in regards to appointments being moved to week of 12/2 .  Phone just rings busy, busy.  I will mail copy of updated schedule/ letter to patient

## 2018-03-06 NOTE — Telephone Encounter (Signed)
I was finally able to reach patient by phone and gave him appointment/date/time

## 2018-03-07 ENCOUNTER — Inpatient Hospital Stay: Payer: PPO

## 2018-03-13 ENCOUNTER — Inpatient Hospital Stay: Payer: PPO | Attending: Hematology & Oncology

## 2018-03-13 ENCOUNTER — Encounter: Payer: Self-pay | Admitting: Family

## 2018-03-13 ENCOUNTER — Inpatient Hospital Stay: Payer: PPO

## 2018-03-13 ENCOUNTER — Other Ambulatory Visit: Payer: Self-pay

## 2018-03-13 ENCOUNTER — Inpatient Hospital Stay (HOSPITAL_BASED_OUTPATIENT_CLINIC_OR_DEPARTMENT_OTHER): Payer: PPO | Admitting: Family

## 2018-03-13 VITALS — BP 132/72 | HR 88 | Temp 98.7°F | Resp 18 | Wt 265.0 lb

## 2018-03-13 DIAGNOSIS — C679 Malignant neoplasm of bladder, unspecified: Secondary | ICD-10-CM | POA: Insufficient documentation

## 2018-03-13 DIAGNOSIS — C772 Secondary and unspecified malignant neoplasm of intra-abdominal lymph nodes: Secondary | ICD-10-CM | POA: Insufficient documentation

## 2018-03-13 DIAGNOSIS — D508 Other iron deficiency anemias: Secondary | ICD-10-CM

## 2018-03-13 DIAGNOSIS — Z79899 Other long term (current) drug therapy: Secondary | ICD-10-CM

## 2018-03-13 DIAGNOSIS — G629 Polyneuropathy, unspecified: Secondary | ICD-10-CM | POA: Insufficient documentation

## 2018-03-13 DIAGNOSIS — Z5111 Encounter for antineoplastic chemotherapy: Secondary | ICD-10-CM

## 2018-03-13 DIAGNOSIS — K5780 Diverticulitis of intestine, part unspecified, with perforation and abscess without bleeding: Secondary | ICD-10-CM | POA: Insufficient documentation

## 2018-03-13 DIAGNOSIS — B9620 Unspecified Escherichia coli [E. coli] as the cause of diseases classified elsewhere: Secondary | ICD-10-CM

## 2018-03-13 LAB — CBC WITH DIFFERENTIAL (CANCER CENTER ONLY)
Abs Immature Granulocytes: 0.05 10*3/uL (ref 0.00–0.07)
Basophils Absolute: 0.1 10*3/uL (ref 0.0–0.1)
Basophils Relative: 1 %
Eosinophils Absolute: 0.1 10*3/uL (ref 0.0–0.5)
Eosinophils Relative: 2 %
HCT: 34.1 % — ABNORMAL LOW (ref 39.0–52.0)
Hemoglobin: 10.8 g/dL — ABNORMAL LOW (ref 13.0–17.0)
Immature Granulocytes: 1 %
Lymphocytes Relative: 22 %
Lymphs Abs: 1.9 10*3/uL (ref 0.7–4.0)
MCH: 31 pg (ref 26.0–34.0)
MCHC: 31.7 g/dL (ref 30.0–36.0)
MCV: 98 fL (ref 80.0–100.0)
Monocytes Absolute: 1.3 10*3/uL — ABNORMAL HIGH (ref 0.1–1.0)
Monocytes Relative: 15 %
Neutro Abs: 5.1 10*3/uL (ref 1.7–7.7)
Neutrophils Relative %: 59 %
Platelet Count: 183 10*3/uL (ref 150–400)
RBC: 3.48 MIL/uL — ABNORMAL LOW (ref 4.22–5.81)
RDW: 14.6 % (ref 11.5–15.5)
WBC Count: 8.5 10*3/uL (ref 4.0–10.5)
nRBC: 0 % (ref 0.0–0.2)

## 2018-03-13 LAB — CMP (CANCER CENTER ONLY)
ALT: 14 U/L (ref 0–44)
AST: 22 U/L (ref 15–41)
Albumin: 3.9 g/dL (ref 3.5–5.0)
Alkaline Phosphatase: 51 U/L (ref 38–126)
Anion gap: 9 (ref 5–15)
BUN: 24 mg/dL — ABNORMAL HIGH (ref 8–23)
CO2: 30 mmol/L (ref 22–32)
Calcium: 8.8 mg/dL — ABNORMAL LOW (ref 8.9–10.3)
Chloride: 101 mmol/L (ref 98–111)
Creatinine: 1.48 mg/dL — ABNORMAL HIGH (ref 0.61–1.24)
GFR, Est AFR Am: 56 mL/min — ABNORMAL LOW (ref >60)
GFR, Estimated: 49 mL/min — ABNORMAL LOW (ref >60)
Glucose, Bld: 138 mg/dL — ABNORMAL HIGH (ref 70–99)
Potassium: 3.4 mmol/L — ABNORMAL LOW (ref 3.5–5.1)
Sodium: 140 mmol/L (ref 135–145)
Total Bilirubin: 0.4 mg/dL (ref 0.3–1.2)
Total Protein: 5.9 g/dL — ABNORMAL LOW (ref 6.5–8.1)

## 2018-03-13 MED ORDER — HEPARIN SOD (PORK) LOCK FLUSH 100 UNIT/ML IV SOLN
500.0000 [IU] | Freq: Once | INTRAVENOUS | Status: AC | PRN
  Administered 2018-03-13: 500 [IU]
  Filled 2018-03-13: qty 5

## 2018-03-13 MED ORDER — ONDANSETRON HCL 4 MG/2ML IJ SOLN
8.0000 mg | Freq: Once | INTRAMUSCULAR | Status: AC
  Administered 2018-03-13: 8 mg via INTRAVENOUS

## 2018-03-13 MED ORDER — SODIUM CHLORIDE 0.9% FLUSH
10.0000 mL | INTRAVENOUS | Status: DC | PRN
  Administered 2018-03-13: 10 mL
  Filled 2018-03-13: qty 10

## 2018-03-13 MED ORDER — ONDANSETRON HCL 4 MG/2ML IJ SOLN
INTRAMUSCULAR | Status: AC
  Filled 2018-03-13: qty 4

## 2018-03-13 MED ORDER — SODIUM CHLORIDE 0.9 % IV SOLN
29.9000 mg/m2 | Freq: Once | INTRAVENOUS | Status: AC
  Administered 2018-03-13: 75 mg via INTRAVENOUS
  Filled 2018-03-13: qty 3

## 2018-03-13 MED ORDER — DEXAMETHASONE SODIUM PHOSPHATE 10 MG/ML IJ SOLN
INTRAMUSCULAR | Status: AC
  Filled 2018-03-13: qty 1

## 2018-03-13 MED ORDER — SODIUM CHLORIDE 0.9 % IV SOLN
Freq: Once | INTRAVENOUS | Status: AC
  Administered 2018-03-13: 15:00:00 via INTRAVENOUS
  Filled 2018-03-13: qty 250

## 2018-03-13 MED ORDER — DEXAMETHASONE SODIUM PHOSPHATE 10 MG/ML IJ SOLN
10.0000 mg | Freq: Once | INTRAMUSCULAR | Status: AC
  Administered 2018-03-13: 10 mg via INTRAVENOUS

## 2018-03-13 NOTE — Progress Notes (Signed)
Hematology and Oncology Follow Up Visit  [name] Rose [id number] 11/20/1950 67 y.o. 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 Taxotere 80mg /m2 IV q 3 wks - s/p cycle #3 - d/c due to progression  Current Therapy:   M-VAC - s/p cycle 4   Interim History: [name] Rose is here today for follow-up and treatment. He continues to to do well and has no complaints at this time. PET scan last week reviewed by [name] and was felt to show stable disease.  For several days after treatment, he will have some mild fatigue, lightheadedness and nauseas without vomiting. He describes this as mild and tolerable.  He is staying active playing gold regularly during the week.  No fever, chills, cough, rash, SOB, chest pain, palpitations, abdominal pain or changes in bowel or bladder habits.  No lymphadenopathy noted on exam.  His neuropathy in the hands and feet is unchanged. He has no falls or syncopal episodes to report.  He has intermittent puffiness in his feet that comes and goes.  No episodes of bleeding, no bruising or petechiae.  He has maintained a good appetite and is staying well hydrated. His weight is stable.   ECOG Performance Status: 1 - Symptomatic but completely ambulatory  Medications:  Allergies as of 03/13/2018   No Known Allergies     Medication List        Accurate as of 03/13/18  1:47 PM. Always use your most recent med list.          ALEVE 220 MG Caps Generic drug:  Naproxen Sodium Take 220 mg by mouth 2 (two) times daily.   amLODipine 10 MG tablet Commonly known as:  NORVASC Take 10 mg by mouth daily.   bisoprolol-hydrochlorothiazide 10-6.25 MG tablet Commonly known as:  ZIAC Take 1 tablet by mouth daily.   Cinnamon 500 MG Tabs Take 1 tablet by mouth 2 (two) times daily.   dexamethasone 4 MG  tablet Commonly known as:  DECADRON Take 2 tablets by mouth once a day on the day after chemotherapy and then take 2 tablets two times a day for 2 days. Take with food.   gabapentin 100 MG capsule Commonly known as:  NEURONTIN TAKE 3 CAPSULES (300 MG TOTAL) BY MOUTH 2 (TWO) TIMES DAILY.   lidocaine-prilocaine cream Commonly known as:  EMLA Apply to affected area once   LORazepam 0.5 MG tablet Commonly known as:  ATIVAN TAKE 1 TABLET (0.5 MG TOTAL) BY MOUTH EVERY 6 (SIX) HOURS AS NEEDED (NAUSEA OR VOMITING).   losartan 100 MG tablet Commonly known as:  COZAAR Take 100 mg by mouth daily.   MAGNESIUM PO Take 400 mg by mouth at bedtime. Leg cramps   ondansetron 8 MG tablet Commonly known as:  ZOFRAN Take 1 tablet (8 mg total) by mouth 2 (two) times daily as needed. Start on the third day after chemotherapy.   polyethylene glycol packet Commonly known as:  MIRALAX / GLYCOLAX Take 17 g by mouth 2 (two) times daily.   prochlorperazine 10 MG tablet Commonly known as:  COMPAZINE Take 1 tablet (10 mg total) by mouth every 6 (six) hours as needed (Nausea or vomiting).   senna 8.6 MG Tabs tablet Commonly known as:  SENOKOT Take 1 tablet (8.6 mg total) by mouth 2 (two) times daily.   vitamin B-6  250 MG tablet Take 1 tablet (250 mg total) by mouth daily.       Allergies: No Known Allergies  Past Medical History, Surgical history, Social history, and Family History were reviewed and updated.  Review of Systems: All other 10 point review of systems is negative.   Physical Exam:  vitals were not taken for this visit.   Wt Readings from Last 3 Encounters:  02/06/18 260 lb 12 oz (118.3 kg)  01/09/18 256 lb (116.1 kg)  12/29/17 255 lb (115.7 kg)    Ocular: Sclerae unicteric, pupils equal, round and reactive to light Ear-nose-throat: Oropharynx clear, dentition fair Lymphatic: No cervical, supraclavicular or axillary adenopathy Lungs no rales or rhonchi, good excursion  bilaterally Heart regular rate and rhythm, no murmur appreciated Abd soft, nontender, positive bowel sounds, no liver or spleen tip palpated on exam, no fluid wave  MSK no focal spinal tenderness, no joint edema Neuro: non-focal, well-oriented, appropriate affect Breasts: Deferred   Lab Results  Component Value Date   WBC 8.5 03/13/2018   HGB 10.8 (L) 03/13/2018   HCT 34.1 (L) 03/13/2018   MCV 98.0 03/13/2018   PLT 183 03/13/2018   Lab Results  Component Value Date   FERRITIN 861 (H) 02/06/2018   IRON 92 02/06/2018   TIBC 248 02/06/2018   UIBC 155 02/06/2018   IRONPCTSAT 37 (L) 02/06/2018   Lab Results  Component Value Date   RBC 3.48 (L) 03/13/2018   No results found for: KPAFRELGTCHN, LAMBDASER, KAPLAMBRATIO No results found for: IGGSERUM, IGA, IGMSERUM No results found for: [name], SPEI   Chemistry      Component Value Date/Time   NA 137 02/06/2018 0820   NA 141 02/21/2017 0902   K 3.6 02/06/2018 0820   K 4.1 02/21/2017 0902   CL 104 02/06/2018 0820   CL 98 02/21/2017 0902   CO2 29 02/06/2018 0820   CO2 31 02/21/2017 0902   BUN 18 02/06/2018 0820   BUN 17 02/21/2017 0902   CREATININE 1.40 (H) 02/06/2018 0820   CREATININE 1.2 02/21/2017 0902      Component Value Date/Time   CALCIUM 9.0 02/06/2018 0820   CALCIUM 9.2 02/21/2017 0902   ALKPHOS 65 02/06/2018 0820   ALKPHOS 65 02/21/2017 0902   AST 28 02/06/2018 0820   ALT 21 02/06/2018 0820   ALT 18 02/21/2017 0902   BILITOT 0.6 02/06/2018 0820       Impression and Plan: [name] Rose is a very pleasant caucasian gentleman with metastatic high grade bladder cancer.  We will proceed with treatment today as planned.  We will see him in another month.  He will contact our office with any questions or concerns. We can certainly see him sooner if need be.   Laverna Peace, NP 12/2/20191:47 PM

## 2018-03-13 NOTE — Patient Instructions (Signed)
Implanted Port Home Guide An implanted port is a type of central line that is placed under the skin. Central lines are used to provide IV access when treatment or nutrition needs to be given through a person's veins. Implanted ports are used for long-term IV access. An implanted port may be placed because:  You need IV medicine that would be irritating to the small veins in your hands or arms.  You need long-term IV medicines, such as antibiotics.  You need IV nutrition for a long period.  You need frequent blood draws for lab tests.  You need dialysis.  Implanted ports are usually placed in the chest area, but they can also be placed in the upper arm, the abdomen, or the leg. An implanted port has two main parts:  Reservoir. The reservoir is round and will appear as a small, raised area under your skin. The reservoir is the part where a needle is inserted to give medicines or draw blood.  Catheter. The catheter is a thin, flexible tube that extends from the reservoir. The catheter is placed into a large vein. Medicine that is inserted into the reservoir goes into the catheter and then into the vein.  How will I care for my incision site? Do not get the incision site wet. Bathe or shower as directed by your health care provider. How is my port accessed? Special steps must be taken to access the port:  Before the port is accessed, a numbing cream can be placed on the skin. This helps numb the skin over the port site.  Your health care provider uses a sterile technique to access the port. ? Your health care provider must put on a mask and sterile gloves. ? The skin over your port is cleaned carefully with an antiseptic and allowed to dry. ? The port is gently pinched between sterile gloves, and a needle is inserted into the port.  Only "non-coring" port needles should be used to access the port. Once the port is accessed, a blood return should be checked. This helps ensure that the port  is in the vein and is not clogged.  If your port needs to remain accessed for a constant infusion, a clear (transparent) bandage will be placed over the needle site. The bandage and needle will need to be changed every week, or as directed by your health care provider.  Keep the bandage covering the needle clean and dry. Do not get it wet. Follow your health care provider's instructions on how to take a shower or bath while the port is accessed.  If your port does not need to stay accessed, no bandage is needed over the port.  What is flushing? Flushing helps keep the port from getting clogged. Follow your health care provider's instructions on how and when to flush the port. Ports are usually flushed with saline solution or a medicine called heparin. The need for flushing will depend on how the port is used.  If the port is used for intermittent medicines or blood draws, the port will need to be flushed: ? After medicines have been given. ? After blood has been drawn. ? As part of routine maintenance.  If a constant infusion is running, the port may not need to be flushed.  How long will my port stay implanted? The port can stay in for as long as your health care provider thinks it is needed. When it is time for the port to come out, surgery will be   done to remove it. The procedure is similar to the one performed when the port was put in. When should I seek immediate medical care? When you have an implanted port, you should seek immediate medical care if:  You notice a bad smell coming from the incision site.  You have swelling, redness, or drainage at the incision site.  You have more swelling or pain at the port site or the surrounding area.  You have a fever that is not controlled with medicine.  This information is not intended to replace advice given to you by your health care provider. Make sure you discuss any questions you have with your health care provider. Document  Released: 03/29/2005 Document Revised: 09/04/2015 Document Reviewed: 12/04/2012 Elsevier Interactive Patient Education  2017 [employer]  

## 2018-03-13 NOTE — Patient Instructions (Signed)
Methotrexate injection What is this medicine? METHOTREXATE (METH oh TREX ate) is a chemotherapy drug used to treat cancer including breast cancer, leukemia, and lymphoma. This medicine can also be used to treat psoriasis and certain kinds of arthritis. This medicine may be used for other purposes; ask your health care provider or pharmacist if you have questions. What should I tell my health care provider before I take this medicine? They need to know if you have any of these conditions: -fluid in the stomach area or lungs -if you often drink alcohol -infection or immune system problems -kidney disease -liver disease -low blood counts, like low white cell, platelet, or red cell counts -lung disease -radiation therapy -stomach ulcers -ulcerative colitis -an unusual or allergic reaction to methotrexate, other medicines, foods, dyes, or preservatives -pregnant or trying to get pregnant -breast-feeding How should I use this medicine? This medicine is for infusion into a vein or for injection into muscle or into the spinal fluid (whichever applies). It is usually given by a health care professional in a hospital or clinic setting. In rare cases, you might get this medicine at home. You will be taught how to give this medicine. Use exactly as directed. Take your medicine at regular intervals. Do not take your medicine more often than directed. If this medicine is used for arthritis or psoriasis, it should be taken weekly, NOT daily. It is important that you put your used needles and syringes in a special sharps container. Do not put them in a trash can. If you do not have a sharps container, call your pharmacist or healthcare provider to get one. Talk to your pediatrician regarding the use of this medicine in children. While this drug may be prescribed for children as young as 2 years for selected conditions, precautions do apply. Overdosage: If you think you have taken too much of this medicine  contact a poison control center or emergency room at once. NOTE: This medicine is only for you. Do not share this medicine with others. What if I miss a dose? It is important not to miss your dose. Call your doctor or health care professional if you are unable to keep an appointment. If you give yourself the medicine and you miss a dose, talk with your doctor or health care professional. Do not take double or extra doses. What may interact with this medicine? This medicine may interact with the following medications: -acitretin -aspirin or aspirin-like medicines including salicylates -azathioprine -certain antibiotics like chloramphenicol, penicillin, tetracycline -certain medicines for stomach problems like esomeprazole, omeprazole, pantoprazole -cyclosporine -gold -hydroxychloroquine -live virus vaccines -mercaptopurine -NSAIDs, medicines for pain and inflammation, like ibuprofen or naproxen -other cytotoxic agents -penicillamine -phenylbutazone -phenytoin -probenacid -retinoids such as isotretinoin and tretinoin -steroid medicines like prednisone or cortisone -sulfonamides like sulfasalazine and trimethoprim/sulfamethoxazole -theophylline This list may not describe all possible interactions. Give your health care provider a list of all the medicines, herbs, non-prescription drugs, or dietary supplements you use. Also tell them if you smoke, drink alcohol, or use illegal drugs. Some items may interact with your medicine. What should I watch for while using this medicine? Avoid alcoholic drinks. In some cases, you may be given additional medicines to help with side effects. Follow all directions for their use. This medicine can make you more sensitive to the sun. Keep out of the sun. If you cannot avoid being in the sun, wear protective clothing and use sunscreen. Do not use sun lamps or tanning beds/booths. You may get drowsy  or dizzy. Do not drive, use machinery, or do anything that  needs mental alertness until you know how this medicine affects you. Do not stand or sit up quickly, especially if you are an older patient. This reduces the risk of dizzy or fainting spells. You may need blood work done while you are taking this medicine. Call your doctor or health care professional for advice if you get a fever, chills or sore throat, or other symptoms of a cold or flu. Do not treat yourself. This drug decreases your body's ability to fight infections. Try to avoid being around people who are sick. This medicine may increase your risk to bruise or bleed. Call your doctor or health care professional if you notice any unusual bleeding. Check with your doctor or health care professional if you get an attack of severe diarrhea, nausea and vomiting, or if you sweat a lot. The loss of too much body fluid can make it dangerous for you to take this medicine. Talk to your doctor about your risk of cancer. You may be more at risk for certain types of cancers if you take this medicine. Both men and women must use effective birth control with this medicine. Do not become pregnant while taking this medicine or until at least 1 normal menstrual cycle has occurred after stopping it. Women should inform their doctor if they wish to become pregnant or think they might be pregnant. Men should not father a child while taking this medicine and for 3 months after stopping it. There is a potential for serious side effects to an unborn child. Talk to your health care professional or pharmacist for more information. Do not breast-feed an infant while taking this medicine. What side effects may I notice from receiving this medicine? Side effects that you should report to your doctor or health care professional as soon as possible: -allergic reactions like skin rash, itching or hives, swelling of the face, lips, or tongue -back pain -breathing problems or shortness of breath -confusion -diarrhea -dry,  nonproductive cough -low blood counts - this medicine may decrease the number of white blood cells, red blood cells and platelets. You may be at increased risk of infections and bleeding -mouth sores -redness, blistering, peeling or loosening of the skin, including inside the mouth -seizures -severe headaches -signs of infection - fever or chills, cough, sore throat, pain or difficulty passing urine -signs and symptoms of bleeding such as bloody or black, tarry stools; red or dark-brown urine; spitting up blood or brown material that looks like coffee grounds; red spots on the skin; unusual bruising or bleeding from the eye, gums, or nose -signs and symptoms of kidney injury like trouble passing urine or change in the amount of urine -signs and symptoms of liver injury like dark yellow or brown urine; general ill feeling or flu-like symptoms; light-colored stools; loss of appetite; nausea; right upper belly pain; unusually weak or tired; yellowing of the eyes or skin -stiff neck -vomiting Side effects that usually do not require medical attention (report to your doctor or health care professional if they continue or are bothersome): -dizziness -hair loss -headache -stomach pain -upset stomach This list may not describe all possible side effects. Call your doctor for medical advice about side effects. You may report side effects to FDA at 1-800-FDA-1088. Where should I keep my medicine? If you are using this medicine at home, you will be instructed on how to store this medicine. Throw away any unused medicine after  the expiration date on the label. NOTE: This sheet is a summary. It may not cover all possible information. If you have questions about this medicine, talk to your doctor, pharmacist, or health care provider.  2018 Elsevier/Gold Standard (2014-07-18 12:36:41)

## 2018-03-14 ENCOUNTER — Inpatient Hospital Stay: Payer: PPO

## 2018-03-14 ENCOUNTER — Other Ambulatory Visit: Payer: PPO

## 2018-03-14 ENCOUNTER — Encounter: Payer: Self-pay | Admitting: *Deleted

## 2018-03-14 ENCOUNTER — Ambulatory Visit: Payer: PPO

## 2018-03-14 ENCOUNTER — Telehealth: Payer: Self-pay | Admitting: Hematology & Oncology

## 2018-03-14 VITALS — BP 146/78 | HR 83 | Temp 98.1°F | Resp 18

## 2018-03-14 DIAGNOSIS — Z5111 Encounter for antineoplastic chemotherapy: Secondary | ICD-10-CM | POA: Diagnosis not present

## 2018-03-14 DIAGNOSIS — C772 Secondary and unspecified malignant neoplasm of intra-abdominal lymph nodes: Secondary | ICD-10-CM

## 2018-03-14 DIAGNOSIS — C679 Malignant neoplasm of bladder, unspecified: Secondary | ICD-10-CM

## 2018-03-14 LAB — IRON AND TIBC
Iron: 110 ug/dL (ref 42–163)
Saturation Ratios: 39 % (ref 20–55)
TIBC: 278 ug/dL (ref 202–409)
UIBC: 168 ug/dL (ref 117–376)

## 2018-03-14 LAB — FERRITIN: Ferritin: 662 ng/mL — ABNORMAL HIGH (ref 24–336)

## 2018-03-14 LAB — LACTATE DEHYDROGENASE: LDH: 231 U/L — ABNORMAL HIGH (ref 98–192)

## 2018-03-14 MED ORDER — SODIUM CHLORIDE 0.9 % IV SOLN
56.0000 mg/m2 | Freq: Once | INTRAVENOUS | Status: AC
  Administered 2018-03-14: 141 mg via INTRAVENOUS
  Filled 2018-03-14: qty 100

## 2018-03-14 MED ORDER — DOXORUBICIN HCL CHEMO IV INJECTION 2 MG/ML
24.0000 mg/m2 | Freq: Once | INTRAVENOUS | Status: AC
  Administered 2018-03-14: 60 mg via INTRAVENOUS
  Filled 2018-03-14: qty 30

## 2018-03-14 MED ORDER — PEGFILGRASTIM 6 MG/0.6ML ~~LOC~~ PSKT
6.0000 mg | PREFILLED_SYRINGE | Freq: Once | SUBCUTANEOUS | Status: AC
  Administered 2018-03-14: 6 mg via SUBCUTANEOUS

## 2018-03-14 MED ORDER — HEPARIN SOD (PORK) LOCK FLUSH 100 UNIT/ML IV SOLN
500.0000 [IU] | Freq: Once | INTRAVENOUS | Status: AC | PRN
  Administered 2018-03-14: 500 [IU]
  Filled 2018-03-14: qty 5

## 2018-03-14 MED ORDER — SODIUM CHLORIDE 0.9 % IV SOLN
Freq: Once | INTRAVENOUS | Status: AC
  Administered 2018-03-14: 13:00:00 via INTRAVENOUS
  Filled 2018-03-14: qty 5

## 2018-03-14 MED ORDER — POTASSIUM CHLORIDE 2 MEQ/ML IV SOLN
Freq: Once | INTRAVENOUS | Status: AC
  Administered 2018-03-14: 11:00:00 via INTRAVENOUS
  Filled 2018-03-14: qty 10

## 2018-03-14 MED ORDER — PEGFILGRASTIM 6 MG/0.6ML ~~LOC~~ PSKT
PREFILLED_SYRINGE | SUBCUTANEOUS | Status: AC
  Filled 2018-03-14: qty 0.6

## 2018-03-14 MED ORDER — SODIUM CHLORIDE 0.9% FLUSH
10.0000 mL | INTRAVENOUS | Status: DC | PRN
  Administered 2018-03-14: 10 mL
  Filled 2018-03-14: qty 10

## 2018-03-14 MED ORDER — SODIUM CHLORIDE 0.9 % IV SOLN
Freq: Once | INTRAVENOUS | Status: AC
  Administered 2018-03-14: 10:00:00 via INTRAVENOUS
  Filled 2018-03-14: qty 250

## 2018-03-14 MED ORDER — PALONOSETRON HCL INJECTION 0.25 MG/5ML
0.2500 mg | Freq: Once | INTRAVENOUS | Status: AC
  Administered 2018-03-14: 0.25 mg via INTRAVENOUS

## 2018-03-14 MED ORDER — PALONOSETRON HCL INJECTION 0.25 MG/5ML
INTRAVENOUS | Status: AC
  Filled 2018-03-14: qty 5

## 2018-03-14 MED ORDER — VINBLASTINE SULFATE CHEMO INJECTION 1 MG/ML
5.0000 mg | Freq: Once | INTRAVENOUS | Status: AC
  Administered 2018-03-14: 5 mg via INTRAVENOUS
  Filled 2018-03-14: qty 5

## 2018-03-14 NOTE — Telephone Encounter (Signed)
Appts already scheduled per 12/2 los

## 2018-03-19 ENCOUNTER — Other Ambulatory Visit: Payer: Self-pay | Admitting: Hematology & Oncology

## 2018-03-22 DIAGNOSIS — C679 Malignant neoplasm of bladder, unspecified: Secondary | ICD-10-CM | POA: Diagnosis not present

## 2018-03-22 DIAGNOSIS — Z8546 Personal history of malignant neoplasm of prostate: Secondary | ICD-10-CM | POA: Diagnosis not present

## 2018-03-22 DIAGNOSIS — I10 Essential (primary) hypertension: Secondary | ICD-10-CM | POA: Diagnosis not present

## 2018-03-22 DIAGNOSIS — R7303 Prediabetes: Secondary | ICD-10-CM | POA: Diagnosis not present

## 2018-03-22 DIAGNOSIS — Z1159 Encounter for screening for other viral diseases: Secondary | ICD-10-CM | POA: Diagnosis not present

## 2018-03-22 DIAGNOSIS — Z0000 Encounter for general adult medical examination without abnormal findings: Secondary | ICD-10-CM | POA: Diagnosis not present

## 2018-03-22 DIAGNOSIS — Z23 Encounter for immunization: Secondary | ICD-10-CM | POA: Diagnosis not present

## 2018-03-22 DIAGNOSIS — E7800 Pure hypercholesterolemia, unspecified: Secondary | ICD-10-CM | POA: Diagnosis not present

## 2018-04-17 ENCOUNTER — Other Ambulatory Visit: Payer: Self-pay

## 2018-04-17 ENCOUNTER — Inpatient Hospital Stay: Payer: PPO

## 2018-04-17 ENCOUNTER — Inpatient Hospital Stay: Payer: PPO | Attending: Hematology & Oncology | Admitting: Hematology & Oncology

## 2018-04-17 ENCOUNTER — Encounter: Payer: Self-pay | Admitting: Hematology & Oncology

## 2018-04-17 VITALS — BP 126/81 | HR 76 | Temp 98.6°F | Resp 18 | Wt 267.0 lb

## 2018-04-17 DIAGNOSIS — K5780 Diverticulitis of intestine, part unspecified, with perforation and abscess without bleeding: Secondary | ICD-10-CM | POA: Insufficient documentation

## 2018-04-17 DIAGNOSIS — G629 Polyneuropathy, unspecified: Secondary | ICD-10-CM | POA: Diagnosis not present

## 2018-04-17 DIAGNOSIS — C772 Secondary and unspecified malignant neoplasm of intra-abdominal lymph nodes: Secondary | ICD-10-CM | POA: Diagnosis not present

## 2018-04-17 DIAGNOSIS — C679 Malignant neoplasm of bladder, unspecified: Secondary | ICD-10-CM | POA: Insufficient documentation

## 2018-04-17 DIAGNOSIS — D508 Other iron deficiency anemias: Secondary | ICD-10-CM

## 2018-04-17 DIAGNOSIS — Z79899 Other long term (current) drug therapy: Secondary | ICD-10-CM | POA: Diagnosis not present

## 2018-04-17 DIAGNOSIS — Z5111 Encounter for antineoplastic chemotherapy: Secondary | ICD-10-CM | POA: Insufficient documentation

## 2018-04-17 LAB — CBC WITH DIFFERENTIAL (CANCER CENTER ONLY)
Abs Immature Granulocytes: 0.05 10*3/uL (ref 0.00–0.07)
Basophils Absolute: 0 10*3/uL (ref 0.0–0.1)
Basophils Relative: 1 %
Eosinophils Absolute: 0.1 10*3/uL (ref 0.0–0.5)
Eosinophils Relative: 1 %
HCT: 29.2 % — ABNORMAL LOW (ref 39.0–52.0)
Hemoglobin: 9.2 g/dL — ABNORMAL LOW (ref 13.0–17.0)
Immature Granulocytes: 1 %
Lymphocytes Relative: 24 %
Lymphs Abs: 1.3 10*3/uL (ref 0.7–4.0)
MCH: 31 pg (ref 26.0–34.0)
MCHC: 31.5 g/dL (ref 30.0–36.0)
MCV: 98.3 fL (ref 80.0–100.0)
Monocytes Absolute: 0.5 10*3/uL (ref 0.1–1.0)
Monocytes Relative: 10 %
Neutro Abs: 3.4 10*3/uL (ref 1.7–7.7)
Neutrophils Relative %: 63 %
Platelet Count: 214 10*3/uL (ref 150–400)
RBC: 2.97 MIL/uL — ABNORMAL LOW (ref 4.22–5.81)
RDW: 14.6 % (ref 11.5–15.5)
WBC Count: 5.4 10*3/uL (ref 4.0–10.5)
nRBC: 0 % (ref 0.0–0.2)

## 2018-04-17 LAB — CMP (CANCER CENTER ONLY)
ALT: 12 U/L (ref 0–44)
AST: 20 U/L (ref 15–41)
Albumin: 3.7 g/dL (ref 3.5–5.0)
Alkaline Phosphatase: 44 U/L (ref 38–126)
Anion gap: 9 (ref 5–15)
BUN: 27 mg/dL — ABNORMAL HIGH (ref 8–23)
CO2: 29 mmol/L (ref 22–32)
Calcium: 8.8 mg/dL — ABNORMAL LOW (ref 8.9–10.3)
Chloride: 100 mmol/L (ref 98–111)
Creatinine: 1.41 mg/dL — ABNORMAL HIGH (ref 0.61–1.24)
GFR, Est AFR Am: 60 mL/min — ABNORMAL LOW (ref >60)
GFR, Estimated: 52 mL/min — ABNORMAL LOW (ref >60)
Glucose, Bld: 214 mg/dL — ABNORMAL HIGH (ref 70–99)
Potassium: 4 mmol/L (ref 3.5–5.1)
Sodium: 138 mmol/L (ref 135–145)
Total Bilirubin: 0.3 mg/dL (ref 0.3–1.2)
Total Protein: 6.1 g/dL — ABNORMAL LOW (ref 6.5–8.1)

## 2018-04-17 LAB — LACTATE DEHYDROGENASE: LDH: 185 U/L (ref 98–192)

## 2018-04-17 MED ORDER — AMOXICILLIN 500 MG PO CAPS: ORAL_CAPSULE | ORAL | 1 refills | Status: DC

## 2018-04-17 MED ORDER — DEXAMETHASONE SODIUM PHOSPHATE 10 MG/ML IJ SOLN
INTRAMUSCULAR | Status: AC
  Filled 2018-04-17: qty 1

## 2018-04-17 MED ORDER — ONDANSETRON HCL 4 MG/2ML IJ SOLN
8.0000 mg | Freq: Once | INTRAMUSCULAR | Status: AC
  Administered 2018-04-17: 8 mg via INTRAVENOUS

## 2018-04-17 MED ORDER — HEPARIN SOD (PORK) LOCK FLUSH 100 UNIT/ML IV SOLN
500.0000 [IU] | Freq: Once | INTRAVENOUS | Status: AC | PRN
  Administered 2018-04-17: 500 [IU]
  Filled 2018-04-17: qty 5

## 2018-04-17 MED ORDER — SODIUM CHLORIDE 0.9% FLUSH
10.0000 mL | INTRAVENOUS | Status: DC | PRN
  Administered 2018-04-17: 10 mL
  Filled 2018-04-17: qty 10

## 2018-04-17 MED ORDER — SODIUM CHLORIDE 0.9 % IV SOLN
29.9000 mg/m2 | Freq: Once | INTRAVENOUS | Status: AC
  Administered 2018-04-17: 75 mg via INTRAVENOUS
  Filled 2018-04-17: qty 3

## 2018-04-17 MED ORDER — SODIUM CHLORIDE 0.9 % IV SOLN
Freq: Once | INTRAVENOUS | Status: AC
  Administered 2018-04-17: 12:00:00 via INTRAVENOUS
  Filled 2018-04-17: qty 250

## 2018-04-17 MED ORDER — DEXAMETHASONE SODIUM PHOSPHATE 10 MG/ML IJ SOLN
10.0000 mg | Freq: Once | INTRAMUSCULAR | Status: AC
  Administered 2018-04-17: 10 mg via INTRAVENOUS

## 2018-04-17 MED ORDER — ONDANSETRON HCL 4 MG/2ML IJ SOLN
INTRAMUSCULAR | Status: AC
  Filled 2018-04-17: qty 4

## 2018-04-17 NOTE — Patient Instructions (Signed)
Methotrexate injection What is this medicine? METHOTREXATE (METH oh TREX ate) is a chemotherapy drug used to treat cancer including breast cancer, leukemia, and lymphoma. This medicine can also be used to treat psoriasis and certain kinds of arthritis. This medicine may be used for other purposes; ask your health care provider or pharmacist if you have questions. What should I tell my health care provider before I take this medicine? They need to know if you have any of these conditions: -fluid in the stomach area or lungs -if you often drink alcohol -infection or immune system problems -kidney disease -liver disease -low blood counts, like low white cell, platelet, or red cell counts -lung disease -radiation therapy -stomach ulcers -ulcerative colitis -an unusual or allergic reaction to methotrexate, other medicines, foods, dyes, or preservatives -pregnant or trying to get pregnant -breast-feeding How should I use this medicine? This medicine is for infusion into a vein or for injection into muscle or into the spinal fluid (whichever applies). It is usually given by a health care professional in a hospital or clinic setting. In rare cases, you might get this medicine at home. You will be taught how to give this medicine. Use exactly as directed. Take your medicine at regular intervals. Do not take your medicine more often than directed. If this medicine is used for arthritis or psoriasis, it should be taken weekly, NOT daily. It is important that you put your used needles and syringes in a special sharps container. Do not put them in a trash can. If you do not have a sharps container, call your pharmacist or healthcare provider to get one. Talk to your pediatrician regarding the use of this medicine in children. While this drug may be prescribed for children as young as 2 years for selected conditions, precautions do apply. Overdosage: If you think you have taken too much of this medicine  contact a poison control center or emergency room at once. NOTE: This medicine is only for you. Do not share this medicine with others. What if I miss a dose? It is important not to miss your dose. Call your doctor or health care professional if you are unable to keep an appointment. If you give yourself the medicine and you miss a dose, talk with your doctor or health care professional. Do not take double or extra doses. What may interact with this medicine? This medicine may interact with the following medications: -acitretin -aspirin or aspirin-like medicines including salicylates -azathioprine -certain antibiotics like chloramphenicol, penicillin, tetracycline -certain medicines for stomach problems like esomeprazole, omeprazole, pantoprazole -cyclosporine -gold -hydroxychloroquine -live virus vaccines -mercaptopurine -NSAIDs, medicines for pain and inflammation, like ibuprofen or naproxen -other cytotoxic agents -penicillamine -phenylbutazone -phenytoin -probenacid -retinoids such as isotretinoin and tretinoin -steroid medicines like prednisone or cortisone -sulfonamides like sulfasalazine and trimethoprim/sulfamethoxazole -theophylline This list may not describe all possible interactions. Give your health care provider a list of all the medicines, herbs, non-prescription drugs, or dietary supplements you use. Also tell them if you smoke, drink alcohol, or use illegal drugs. Some items may interact with your medicine. What should I watch for while using this medicine? Avoid alcoholic drinks. In some cases, you may be given additional medicines to help with side effects. Follow all directions for their use. This medicine can make you more sensitive to the sun. Keep out of the sun. If you cannot avoid being in the sun, wear protective clothing and use sunscreen. Do not use sun lamps or tanning beds/booths. You may get drowsy  or dizzy. Do not drive, use machinery, or do anything that  needs mental alertness until you know how this medicine affects you. Do not stand or sit up quickly, especially if you are an older patient. This reduces the risk of dizzy or fainting spells. You may need blood work done while you are taking this medicine. Call your doctor or health care professional for advice if you get a fever, chills or sore throat, or other symptoms of a cold or flu. Do not treat yourself. This drug decreases your body's ability to fight infections. Try to avoid being around people who are sick. This medicine may increase your risk to bruise or bleed. Call your doctor or health care professional if you notice any unusual bleeding. Check with your doctor or health care professional if you get an attack of severe diarrhea, nausea and vomiting, or if you sweat a lot. The loss of too much body fluid can make it dangerous for you to take this medicine. Talk to your doctor about your risk of cancer. You may be more at risk for certain types of cancers if you take this medicine. Both men and women must use effective birth control with this medicine. Do not become pregnant while taking this medicine or until at least 1 normal menstrual cycle has occurred after stopping it. Women should inform their doctor if they wish to become pregnant or think they might be pregnant. Men should not father a child while taking this medicine and for 3 months after stopping it. There is a potential for serious side effects to an unborn child. Talk to your health care professional or pharmacist for more information. Do not breast-feed an infant while taking this medicine. What side effects may I notice from receiving this medicine? Side effects that you should report to your doctor or health care professional as soon as possible: -allergic reactions like skin rash, itching or hives, swelling of the face, lips, or tongue -back pain -breathing problems or shortness of breath -confusion -diarrhea -dry,  nonproductive cough -low blood counts - this medicine may decrease the number of white blood cells, red blood cells and platelets. You may be at increased risk of infections and bleeding -mouth sores -redness, blistering, peeling or loosening of the skin, including inside the mouth -seizures -severe headaches -signs of infection - fever or chills, cough, sore throat, pain or difficulty passing urine -signs and symptoms of bleeding such as bloody or black, tarry stools; red or dark-brown urine; spitting up blood or brown material that looks like coffee grounds; red spots on the skin; unusual bruising or bleeding from the eye, gums, or nose -signs and symptoms of kidney injury like trouble passing urine or change in the amount of urine -signs and symptoms of liver injury like dark yellow or brown urine; general ill feeling or flu-like symptoms; light-colored stools; loss of appetite; nausea; right upper belly pain; unusually weak or tired; yellowing of the eyes or skin -stiff neck -vomiting Side effects that usually do not require medical attention (report to your doctor or health care professional if they continue or are bothersome): -dizziness -hair loss -headache -stomach pain -upset stomach This list may not describe all possible side effects. Call your doctor for medical advice about side effects. You may report side effects to FDA at 1-800-FDA-1088. Where should I keep my medicine? If you are using this medicine at home, you will be instructed on how to store this medicine. Throw away any unused medicine after  the expiration date on the label. NOTE: This sheet is a summary. It may not cover all possible information. If you have questions about this medicine, talk to your doctor, pharmacist, or health care provider.  2019 [employer] Standard (2016-11-18 13:31:42)

## 2018-04-17 NOTE — Progress Notes (Signed)
Hematology and Oncology Follow Up Visit  [name] Rose [id number] [birth year] 68 y.o. 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 Taxotere 80mg /m2 IV q 3 wks - s/p cycle #3 - d/c due to progression  Current Therapy:   M-VAC - s/p cycle #5   Interim History: [name] Rose is here today for follow-up and treatment.  He had a wonderful time over Christmas.  He and his family went down to the [place].  They went to [facility].  He enjoyed himself down there.  He has had no problems with pain.  He has had no problems with nausea or vomiting.  He has had no bleeding.  There is been no fevers.  He has had no cough.  There is been no issues with respect to the abdominal abscess that he had during the summer.  Thankfully, this has not recurred.  His last PET scan was done back in November.  He has had no headache.  Overall, his performance status is ECOG 0.  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  However, this does not affect his overall quality of life nor does it affect his golf game.  Medications:  Allergies as of 04/17/2018   No Known Allergies     Medication List       Accurate as of April 17, 2018 11:56 AM. Always use your most recent med list.        ALEVE 220 MG Caps Generic drug:  Naproxen Sodium Take 220 mg by mouth 2 (two) times daily.   amLODipine 10 MG tablet Commonly known as:  NORVASC Take 10 mg by mouth daily.   bisoprolol-hydrochlorothiazide 10-6.25 MG tablet Commonly known as:  ZIAC Take 1 tablet by mouth daily.   Cinnamon 500 MG Tabs Take 1 tablet by mouth 2 (two) times daily.   dexamethasone 4 MG tablet Commonly known as:  DECADRON Take 2 tablets by mouth once a day on the day after chemotherapy and then take 2 tablets two times a day for 2 days. Take with food.     gabapentin 100 MG capsule Commonly known as:  NEURONTIN TAKE 3 CAPSULES (300 MG TOTAL) BY MOUTH 2 (TWO) TIMES DAILY.   lidocaine-prilocaine cream Commonly known as:  EMLA Apply to affected area once   LORazepam 0.5 MG tablet Commonly known as:  ATIVAN TAKE 1 TABLET (0.5 MG TOTAL) BY MOUTH EVERY 6 (SIX) HOURS AS NEEDED (NAUSEA OR VOMITING).   losartan 100 MG tablet Commonly known as:  COZAAR Take 100 mg by mouth daily.   MAGNESIUM PO Take 400 mg by mouth at bedtime. Leg cramps   ondansetron 8 MG tablet Commonly known as:  ZOFRAN Take 1 tablet (8 mg total) by mouth 2 (two) times daily as needed. Start on the third day after chemotherapy.   polyethylene glycol packet Commonly known as:  MIRALAX / GLYCOLAX Take 17 g by mouth 2 (two) times daily.   prochlorperazine 10 MG tablet Commonly known as:  COMPAZINE Take 1 tablet (10 mg total) by mouth every 6 (six) hours as needed (Nausea or vomiting).   senna 8.6 MG Tabs tablet Commonly known as:  SENOKOT Take 1 tablet (8.6 mg total) by mouth 2 (two) times daily.   vitamin B-6 250 MG tablet Take 1 tablet (250 mg total) by mouth  daily.       Allergies: No Known Allergies  Past Medical History, Surgical history, Social history, and Family History were reviewed and updated.  Review of Systems: Review of Systems  Constitutional: Negative.   HENT: Negative.   Eyes: Negative.   Respiratory: Negative.   Cardiovascular: Negative.   Gastrointestinal: Negative.   Genitourinary: Negative.   Musculoskeletal: Negative.   Skin: Negative.   Neurological: Positive for tingling.  Endo/Heme/Allergies: Negative.   Psychiatric/Behavioral: Negative.      Physical Exam:  weight is 267 lb (121.1 kg). His oral temperature is 98.6 F (37 C). His blood pressure is 126/81 and his pulse is 76. His respiration is 18 and oxygen saturation is 99%.   Wt Readings from Last 3 Encounters:  04/17/18 267 lb (121.1 kg)  03/13/18 265 lb (120.2 kg)   02/06/18 260 lb 12 oz (118.3 kg)    Physical Exam Vitals signs reviewed.  HENT:     Head: Normocephalic and atraumatic.  Eyes:     Pupils: Pupils are equal, round, and reactive to light.  Neck:     Musculoskeletal: Normal range of motion.  Cardiovascular:     Rate and Rhythm: Normal rate and regular rhythm.     Heart sounds: Normal heart sounds.  Pulmonary:     Effort: Pulmonary effort is normal.     Breath sounds: Normal breath sounds.  Abdominal:     General: Bowel sounds are normal.     Palpations: Abdomen is soft.  Musculoskeletal: Normal range of motion.        General: No tenderness or deformity.  Lymphadenopathy:     Cervical: No cervical adenopathy.  Skin:    General: Skin is warm and dry.     Findings: No erythema or rash.  Neurological:     Mental Status: He is alert and oriented to person, place, and time.  Psychiatric:        Behavior: Behavior normal.        Thought Content: Thought content normal.        Judgment: Judgment normal.      Lab Results  Component Value Date   WBC 8.5 03/13/2018   HGB 10.8 (L) 03/13/2018   HCT 34.1 (L) 03/13/2018   MCV 98.0 03/13/2018   PLT 183 03/13/2018   Lab Results  Component Value Date   FERRITIN 662 (H) 03/13/2018   IRON 110 03/13/2018   TIBC 278 03/13/2018   UIBC 168 03/13/2018   IRONPCTSAT 39 03/13/2018   Lab Results  Component Value Date   RBC 3.48 (L) 03/13/2018   No results found for: KPAFRELGTCHN, LAMBDASER, KAPLAMBRATIO No results found for: IGGSERUM, IGA, IGMSERUM No results found for: [name], SPEI   Chemistry      Component Value Date/Time   NA 140 03/13/2018 1335   NA 141 02/21/2017 0902   K 3.4 (L) 03/13/2018 1335   K 4.1 02/21/2017 0902   CL 101 03/13/2018 1335   CL 98 02/21/2017 0902   CO2 30 03/13/2018 1335   CO2 31 02/21/2017 0902   BUN 24 (H) 03/13/2018 1335   BUN 17 02/21/2017 0902   CREATININE 1.48 (H) 03/13/2018 1335    CREATININE 1.2 02/21/2017 0902      Component Value Date/Time   CALCIUM 8.8 (L) 03/13/2018 1335   CALCIUM 9.2 02/21/2017 0902   ALKPHOS 51 03/13/2018 1335   ALKPHOS 65 02/21/2017 0902   AST 22 03/13/2018 1335  ALT 14 03/13/2018 1335   ALT 18 02/21/2017 0902   BILITOT 0.4 03/13/2018 1335       Impression and Plan: Mr. Slaymaker is a very pleasant caucasian gentleman with metastatic high grade bladder cancer.   For right now, we we will go ahead with his sixth cycle of treatment.  I will then rescan him.  We will repeat the PET scan.  He tells me that he is going to have some dental work done.  I called in some amoxicillin that he takes prophylactically.  He will take 2 g 1 hour prior to his dental procedures.  Hopefully, we will see that he is still responding to treatment.  We will plan to get him back in another month.   Volanda Napoleon, MD 1/6/202011:56 AM

## 2018-04-17 NOTE — Patient Instructions (Signed)

## 2018-04-18 ENCOUNTER — Inpatient Hospital Stay: Payer: PPO

## 2018-04-18 VITALS — BP 139/71 | HR 84 | Temp 98.4°F | Resp 20

## 2018-04-18 DIAGNOSIS — C679 Malignant neoplasm of bladder, unspecified: Secondary | ICD-10-CM

## 2018-04-18 DIAGNOSIS — Z5111 Encounter for antineoplastic chemotherapy: Secondary | ICD-10-CM | POA: Diagnosis not present

## 2018-04-18 DIAGNOSIS — C772 Secondary and unspecified malignant neoplasm of intra-abdominal lymph nodes: Secondary | ICD-10-CM

## 2018-04-18 LAB — IRON AND TIBC
Iron: 89 ug/dL (ref 42–163)
Saturation Ratios: 35 % (ref 20–55)
TIBC: 251 ug/dL (ref 202–409)
UIBC: 162 ug/dL (ref 117–376)

## 2018-04-18 LAB — FERRITIN: Ferritin: 782 ng/mL — ABNORMAL HIGH (ref 24–336)

## 2018-04-18 MED ORDER — SODIUM CHLORIDE 0.9 % IV SOLN
INTRAVENOUS | Status: DC
  Administered 2018-04-18: 11:00:00 via INTRAVENOUS
  Filled 2018-04-18: qty 250

## 2018-04-18 MED ORDER — SODIUM CHLORIDE 0.9 % IV SOLN
Freq: Once | INTRAVENOUS | Status: AC
  Administered 2018-04-18: 09:00:00 via INTRAVENOUS
  Filled 2018-04-18: qty 250

## 2018-04-18 MED ORDER — PEGFILGRASTIM 6 MG/0.6ML ~~LOC~~ PSKT
6.0000 mg | PREFILLED_SYRINGE | Freq: Once | SUBCUTANEOUS | Status: AC
  Administered 2018-04-18: 6 mg via SUBCUTANEOUS

## 2018-04-18 MED ORDER — SODIUM CHLORIDE 0.9 % IV SOLN
56.0000 mg/m2 | Freq: Once | INTRAVENOUS | Status: AC
  Administered 2018-04-18: 141 mg via INTRAVENOUS
  Filled 2018-04-18: qty 141

## 2018-04-18 MED ORDER — POTASSIUM CHLORIDE 2 MEQ/ML IV SOLN
Freq: Once | INTRAVENOUS | Status: AC
  Administered 2018-04-18: 09:00:00 via INTRAVENOUS
  Filled 2018-04-18: qty 10

## 2018-04-18 MED ORDER — SODIUM CHLORIDE 0.9 % IV SOLN
Freq: Once | INTRAVENOUS | Status: AC
  Administered 2018-04-18: 11:00:00 via INTRAVENOUS
  Filled 2018-04-18: qty 5

## 2018-04-18 MED ORDER — HEPARIN SOD (PORK) LOCK FLUSH 100 UNIT/ML IV SOLN
500.0000 [IU] | Freq: Once | INTRAVENOUS | Status: AC
  Administered 2018-04-18: 500 [IU] via INTRAVENOUS
  Filled 2018-04-18: qty 5

## 2018-04-18 MED ORDER — PALONOSETRON HCL INJECTION 0.25 MG/5ML
0.2500 mg | Freq: Once | INTRAVENOUS | Status: AC
  Administered 2018-04-18: 0.25 mg via INTRAVENOUS

## 2018-04-18 MED ORDER — VINBLASTINE SULFATE CHEMO INJECTION 1 MG/ML
5.0000 mg | Freq: Once | INTRAVENOUS | Status: AC
  Administered 2018-04-18: 5 mg via INTRAVENOUS
  Filled 2018-04-18: qty 5

## 2018-04-18 MED ORDER — PEGFILGRASTIM 6 MG/0.6ML ~~LOC~~ PSKT
PREFILLED_SYRINGE | SUBCUTANEOUS | Status: AC
  Filled 2018-04-18: qty 0.6

## 2018-04-18 MED ORDER — DOXORUBICIN HCL CHEMO IV INJECTION 2 MG/ML
24.0000 mg/m2 | Freq: Once | INTRAVENOUS | Status: AC
  Administered 2018-04-18: 60 mg via INTRAVENOUS
  Filled 2018-04-18: qty 30

## 2018-04-18 MED ORDER — SODIUM CHLORIDE 0.9% FLUSH
10.0000 mL | INTRAVENOUS | Status: DC | PRN
  Administered 2018-04-18: 10 mL via INTRAVENOUS
  Filled 2018-04-18: qty 10

## 2018-04-18 MED ORDER — PALONOSETRON HCL INJECTION 0.25 MG/5ML
INTRAVENOUS | Status: AC
  Filled 2018-04-18: qty 5

## 2018-04-18 NOTE — Patient Instructions (Signed)
[place] Discharge Instructions for Patients Receiving Chemotherapy  Today you received the following chemotherapy agents:  Adriamycin, Velban and Cisplatin  To help prevent nausea and vomiting after your treatment, we encourage you to take your nausea medication as ordered per MD.    If you develop nausea and vomiting that is not controlled by your nausea medication, call the clinic.   BELOW ARE SYMPTOMS THAT SHOULD BE REPORTED IMMEDIATELY:  *FEVER GREATER THAN 100.5 F  *CHILLS WITH OR WITHOUT FEVER  NAUSEA AND VOMITING THAT IS NOT CONTROLLED WITH YOUR NAUSEA MEDICATION  *UNUSUAL SHORTNESS OF BREATH  *UNUSUAL BRUISING OR BLEEDING  TENDERNESS IN MOUTH AND THROAT WITH OR WITHOUT PRESENCE OF ULCERS  *URINARY PROBLEMS  *BOWEL PROBLEMS  UNUSUAL RASH Items with * indicate a potential emergency and should be followed up as soon as possible.  Feel free to call the clinic should you have any questions or concerns. The clinic phone number is [phone].  Please show the [place] at check-in to the Emergency Department and triage nurse.

## 2018-04-19 ENCOUNTER — Telehealth: Payer: Self-pay | Admitting: Hematology & Oncology

## 2018-04-19 NOTE — Telephone Encounter (Signed)
Spoke with patient regarding appiontment date/time/location for PET scan/ instructions given-NPO 6 hrs prior

## 2018-04-20 ENCOUNTER — Other Ambulatory Visit: Payer: Self-pay | Admitting: Hematology & Oncology

## 2018-04-20 DIAGNOSIS — C772 Secondary and unspecified malignant neoplasm of intra-abdominal lymph nodes: Secondary | ICD-10-CM

## 2018-04-20 DIAGNOSIS — C679 Malignant neoplasm of bladder, unspecified: Secondary | ICD-10-CM

## 2018-04-25 ENCOUNTER — Ambulatory Visit (HOSPITAL_COMMUNITY)
Admission: RE | Admit: 2018-04-25 | Discharge: 2018-04-25 | Disposition: A | Discharge status: Home / Self Care | Payer: PPO | Source: Ambulatory Visit | Attending: Hematology & Oncology | Admitting: Hematology & Oncology

## 2018-04-25 DIAGNOSIS — C772 Secondary and unspecified malignant neoplasm of intra-abdominal lymph nodes: Secondary | ICD-10-CM | POA: Insufficient documentation

## 2018-04-25 DIAGNOSIS — C679 Malignant neoplasm of bladder, unspecified: Secondary | ICD-10-CM | POA: Diagnosis not present

## 2018-04-25 DIAGNOSIS — C779 Secondary and unspecified malignant neoplasm of lymph node, unspecified: Secondary | ICD-10-CM | POA: Diagnosis not present

## 2018-04-25 LAB — GLUCOSE, CAPILLARY: Glucose-Capillary: 135 mg/dL — ABNORMAL HIGH (ref 70–99)

## 2018-04-25 MED ORDER — FLUDEOXYGLUCOSE F - 18 (FDG) INJECTION
13.4400 | Freq: Once | INTRAVENOUS | Status: AC | PRN
  Administered 2018-04-25: 13.44 via INTRAVENOUS

## 2018-05-15 ENCOUNTER — Inpatient Hospital Stay: Payer: PPO

## 2018-05-15 ENCOUNTER — Encounter: Payer: Self-pay | Admitting: Hematology & Oncology

## 2018-05-15 ENCOUNTER — Telehealth: Payer: Self-pay | Admitting: Hematology & Oncology

## 2018-05-15 ENCOUNTER — Inpatient Hospital Stay: Payer: PPO | Attending: Hematology & Oncology | Admitting: Hematology & Oncology

## 2018-05-15 ENCOUNTER — Other Ambulatory Visit: Payer: Self-pay

## 2018-05-15 VITALS — BP 110/94 | HR 84 | Resp 19 | Wt 270.5 lb

## 2018-05-15 DIAGNOSIS — C772 Secondary and unspecified malignant neoplasm of intra-abdominal lymph nodes: Secondary | ICD-10-CM

## 2018-05-15 DIAGNOSIS — Z79899 Other long term (current) drug therapy: Secondary | ICD-10-CM | POA: Diagnosis not present

## 2018-05-15 DIAGNOSIS — C679 Malignant neoplasm of bladder, unspecified: Secondary | ICD-10-CM

## 2018-05-15 DIAGNOSIS — Z7689 Persons encountering health services in other specified circumstances: Secondary | ICD-10-CM | POA: Diagnosis not present

## 2018-05-15 DIAGNOSIS — K5780 Diverticulitis of intestine, part unspecified, with perforation and abscess without bleeding: Secondary | ICD-10-CM | POA: Diagnosis not present

## 2018-05-15 DIAGNOSIS — Z5111 Encounter for antineoplastic chemotherapy: Secondary | ICD-10-CM

## 2018-05-15 LAB — CBC WITH DIFFERENTIAL (CANCER CENTER ONLY)
Abs Immature Granulocytes: 0.02 10*3/uL (ref 0.00–0.07)
Basophils Absolute: 0.1 10*3/uL (ref 0.0–0.1)
Basophils Relative: 1 %
Eosinophils Absolute: 0 10*3/uL (ref 0.0–0.5)
Eosinophils Relative: 1 %
HCT: 28.6 % — ABNORMAL LOW (ref 39.0–52.0)
Hemoglobin: 9.2 g/dL — ABNORMAL LOW (ref 13.0–17.0)
Immature Granulocytes: 0 %
Lymphocytes Relative: 31 %
Lymphs Abs: 1.7 10*3/uL (ref 0.7–4.0)
MCH: 31.7 pg (ref 26.0–34.0)
MCHC: 32.2 g/dL (ref 30.0–36.0)
MCV: 98.6 fL (ref 80.0–100.0)
Monocytes Absolute: 0.9 10*3/uL (ref 0.1–1.0)
Monocytes Relative: 17 %
Neutro Abs: 2.7 10*3/uL (ref 1.7–7.7)
Neutrophils Relative %: 50 %
Platelet Count: 208 10*3/uL (ref 150–400)
RBC: 2.9 MIL/uL — ABNORMAL LOW (ref 4.22–5.81)
RDW: 15.6 % — ABNORMAL HIGH (ref 11.5–15.5)
WBC Count: 5.3 10*3/uL (ref 4.0–10.5)
nRBC: 0 % (ref 0.0–0.2)

## 2018-05-15 LAB — IRON AND TIBC
Iron: 78 ug/dL (ref 42–163)
Saturation Ratios: 30 % (ref 20–55)
TIBC: 259 ug/dL (ref 202–409)
UIBC: 181 ug/dL (ref 117–376)

## 2018-05-15 LAB — CMP (CANCER CENTER ONLY)
ALT: 12 U/L (ref 0–44)
AST: 18 U/L (ref 15–41)
Albumin: 4 g/dL (ref 3.5–5.0)
Alkaline Phosphatase: 58 U/L (ref 38–126)
Anion gap: 9 (ref 5–15)
BUN: 30 mg/dL — ABNORMAL HIGH (ref 8–23)
CO2: 28 mmol/L (ref 22–32)
Calcium: 9.1 mg/dL (ref 8.9–10.3)
Chloride: 102 mmol/L (ref 98–111)
Creatinine: 1.56 mg/dL — ABNORMAL HIGH (ref 0.61–1.24)
GFR, Est AFR Am: 53 mL/min — ABNORMAL LOW (ref >60)
GFR, Estimated: 46 mL/min — ABNORMAL LOW (ref >60)
Glucose, Bld: 173 mg/dL — ABNORMAL HIGH (ref 70–99)
Potassium: 3.8 mmol/L (ref 3.5–5.1)
Sodium: 139 mmol/L (ref 135–145)
Total Bilirubin: 0.3 mg/dL (ref 0.3–1.2)
Total Protein: 5.8 g/dL — ABNORMAL LOW (ref 6.5–8.1)

## 2018-05-15 LAB — LACTATE DEHYDROGENASE: LDH: 202 U/L — ABNORMAL HIGH (ref 98–192)

## 2018-05-15 LAB — FERRITIN: Ferritin: 766 ng/mL — ABNORMAL HIGH (ref 24–336)

## 2018-05-15 MED ORDER — SODIUM CHLORIDE 0.9% FLUSH
10.0000 mL | Freq: Once | INTRAVENOUS | Status: AC
  Administered 2018-05-15: 10 mL
  Filled 2018-05-15: qty 10

## 2018-05-15 MED ORDER — DEXAMETHASONE SODIUM PHOSPHATE 10 MG/ML IJ SOLN
10.0000 mg | Freq: Once | INTRAMUSCULAR | Status: AC
  Administered 2018-05-15: 10 mg via INTRAVENOUS

## 2018-05-15 MED ORDER — SODIUM CHLORIDE 0.9 % IV SOLN
Freq: Once | INTRAVENOUS | Status: AC
  Administered 2018-05-15: 11:00:00 via INTRAVENOUS
  Filled 2018-05-15: qty 250

## 2018-05-15 MED ORDER — HEPARIN SOD (PORK) LOCK FLUSH 100 UNIT/ML IV SOLN
500.0000 [IU] | Freq: Once | INTRAVENOUS | Status: AC | PRN
  Administered 2018-05-15: 500 [IU]
  Filled 2018-05-15: qty 5

## 2018-05-15 MED ORDER — SODIUM CHLORIDE 0.9% FLUSH
10.0000 mL | INTRAVENOUS | Status: DC | PRN
  Administered 2018-05-15: 10 mL
  Filled 2018-05-15: qty 10

## 2018-05-15 MED ORDER — SODIUM CHLORIDE 0.9 % IV SOLN
75.0000 mg | Freq: Once | INTRAVENOUS | Status: AC
  Administered 2018-05-15: 75 mg via INTRAVENOUS
  Filled 2018-05-15: qty 3

## 2018-05-15 MED ORDER — ONDANSETRON HCL 4 MG/2ML IJ SOLN
8.0000 mg | Freq: Once | INTRAMUSCULAR | Status: AC
  Administered 2018-05-15: 8 mg via INTRAVENOUS

## 2018-05-15 MED ORDER — DEXAMETHASONE SODIUM PHOSPHATE 10 MG/ML IJ SOLN
INTRAMUSCULAR | Status: AC
  Filled 2018-05-15: qty 1

## 2018-05-15 MED ORDER — ONDANSETRON HCL 4 MG/2ML IJ SOLN
INTRAMUSCULAR | Status: AC
  Filled 2018-05-15: qty 4

## 2018-05-15 NOTE — Patient Instructions (Signed)
Methotrexate injection What is this medicine? METHOTREXATE (METH oh TREX ate) is a chemotherapy drug used to treat cancer including breast cancer, leukemia, and lymphoma. This medicine can also be used to treat psoriasis and certain kinds of arthritis. This medicine may be used for other purposes; ask your health care provider or pharmacist if you have questions. What should I tell my health care provider before I take this medicine? They need to know if you have any of these conditions: -fluid in the stomach area or lungs -if you often drink alcohol -infection or immune system problems -kidney disease -liver disease -low blood counts, like low white cell, platelet, or red cell counts -lung disease -radiation therapy -stomach ulcers -ulcerative colitis -an unusual or allergic reaction to methotrexate, other medicines, foods, dyes, or preservatives -pregnant or trying to get pregnant -breast-feeding How should I use this medicine? This medicine is for infusion into a vein or for injection into muscle or into the spinal fluid (whichever applies). It is usually given by a health care professional in a hospital or clinic setting. In rare cases, you might get this medicine at home. You will be taught how to give this medicine. Use exactly as directed. Take your medicine at regular intervals. Do not take your medicine more often than directed. If this medicine is used for arthritis or psoriasis, it should be taken weekly, NOT daily. It is important that you put your used needles and syringes in a special sharps container. Do not put them in a trash can. If you do not have a sharps container, call your pharmacist or healthcare provider to get one. Talk to your pediatrician regarding the use of this medicine in children. While this drug may be prescribed for children as young as 2 years for selected conditions, precautions do apply. Overdosage: If you think you have taken too much of this medicine  contact a poison control center or emergency room at once. NOTE: This medicine is only for you. Do not share this medicine with others. What if I miss a dose? It is important not to miss your dose. Call your doctor or health care professional if you are unable to keep an appointment. If you give yourself the medicine and you miss a dose, talk with your doctor or health care professional. Do not take double or extra doses. What may interact with this medicine? This medicine may interact with the following medications: -acitretin -aspirin or aspirin-like medicines including salicylates -azathioprine -certain antibiotics like chloramphenicol, penicillin, tetracycline -certain medicines for stomach problems like esomeprazole, omeprazole, pantoprazole -cyclosporine -gold -hydroxychloroquine -live virus vaccines -mercaptopurine -NSAIDs, medicines for pain and inflammation, like ibuprofen or naproxen -other cytotoxic agents -penicillamine -phenylbutazone -phenytoin -probenacid -retinoids such as isotretinoin and tretinoin -steroid medicines like prednisone or cortisone -sulfonamides like sulfasalazine and trimethoprim/sulfamethoxazole -theophylline This list may not describe all possible interactions. Give your health care provider a list of all the medicines, herbs, non-prescription drugs, or dietary supplements you use. Also tell them if you smoke, drink alcohol, or use illegal drugs. Some items may interact with your medicine. What should I watch for while using this medicine? Avoid alcoholic drinks. In some cases, you may be given additional medicines to help with side effects. Follow all directions for their use. This medicine can make you more sensitive to the sun. Keep out of the sun. If you cannot avoid being in the sun, wear protective clothing and use sunscreen. Do not use sun lamps or tanning beds/booths. You may get drowsy  or dizzy. Do not drive, use machinery, or do anything that  needs mental alertness until you know how this medicine affects you. Do not stand or sit up quickly, especially if you are an older patient. This reduces the risk of dizzy or fainting spells. You may need blood work done while you are taking this medicine. Call your doctor or health care professional for advice if you get a fever, chills or sore throat, or other symptoms of a cold or flu. Do not treat yourself. This drug decreases your body's ability to fight infections. Try to avoid being around people who are sick. This medicine may increase your risk to bruise or bleed. Call your doctor or health care professional if you notice any unusual bleeding. Check with your doctor or health care professional if you get an attack of severe diarrhea, nausea and vomiting, or if you sweat a lot. The loss of too much body fluid can make it dangerous for you to take this medicine. Talk to your doctor about your risk of cancer. You may be more at risk for certain types of cancers if you take this medicine. Both men and women must use effective birth control with this medicine. Do not become pregnant while taking this medicine or until at least 1 normal menstrual cycle has occurred after stopping it. Women should inform their doctor if they wish to become pregnant or think they might be pregnant. Men should not father a child while taking this medicine and for 3 months after stopping it. There is a potential for serious side effects to an unborn child. Talk to your health care professional or pharmacist for more information. Do not breast-feed an infant while taking this medicine. What side effects may I notice from receiving this medicine? Side effects that you should report to your doctor or health care professional as soon as possible: -allergic reactions like skin rash, itching or hives, swelling of the face, lips, or tongue -back pain -breathing problems or shortness of breath -confusion -diarrhea -dry,  nonproductive cough -low blood counts - this medicine may decrease the number of white blood cells, red blood cells and platelets. You may be at increased risk of infections and bleeding -mouth sores -redness, blistering, peeling or loosening of the skin, including inside the mouth -seizures -severe headaches -signs of infection - fever or chills, cough, sore throat, pain or difficulty passing urine -signs and symptoms of bleeding such as bloody or black, tarry stools; red or dark-brown urine; spitting up blood or brown material that looks like coffee grounds; red spots on the skin; unusual bruising or bleeding from the eye, gums, or nose -signs and symptoms of kidney injury like trouble passing urine or change in the amount of urine -signs and symptoms of liver injury like dark yellow or brown urine; general ill feeling or flu-like symptoms; light-colored stools; loss of appetite; nausea; right upper belly pain; unusually weak or tired; yellowing of the eyes or skin -stiff neck -vomiting Side effects that usually do not require medical attention (report to your doctor or health care professional if they continue or are bothersome): -dizziness -hair loss -headache -stomach pain -upset stomach This list may not describe all possible side effects. Call your doctor for medical advice about side effects. You may report side effects to FDA at 1-800-FDA-1088. Where should I keep my medicine? If you are using this medicine at home, you will be instructed on how to store this medicine. Throw away any unused medicine after  the expiration date on the label. NOTE: This sheet is a summary. It may not cover all possible information. If you have questions about this medicine, talk to your doctor, pharmacist, or health care provider.  2019 [employer] Standard (2016-11-18 13:31:42)

## 2018-05-15 NOTE — Progress Notes (Signed)
Ok to treat after reviewing labs with [name]

## 2018-05-15 NOTE — Telephone Encounter (Signed)
Appointments scheduled calendar printed per 2/3 los

## 2018-05-15 NOTE — Patient Instructions (Signed)

## 2018-05-15 NOTE — Addendum Note (Signed)
Addended by: Burney Gauze R on: 05/15/2018 10:40 AM   Modules accepted: Orders

## 2018-05-15 NOTE — Progress Notes (Signed)
Hematology and Oncology Follow Up Visit  [name] Rose [id number] 29-Dec-1950 68 y.o. 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 Taxotere 80mg /m2 IV q 3 wks - s/p cycle #3 - d/c due to progression  Current Therapy:   M-VAC - s/p cycle #6   Interim History: [name] Rose is here today for follow-up and treatment.  He just got back from [place].  He and his wife were up visiting their daughter in [place], [place].  He got back late Sunday night.  He was able to watch some of the Super Bowl.  His PET scan that he had done back on January 14 shows no obvious metastatic disease.  He has slight activity in a 9 mm left supraclavicular lymph node.  He is feeling pretty good.  He has had no bleeding.  He has had no problems with cough or shortness of breath.  He has had no neuropathy.  His blood sugars have been doing fairly well.  His iron studies that he had done back in early January showed a ferritin of 782 with an iron saturation of 35%.  He has had no rashes.  He has had no fever.  There is been no headache.  He has had no mouth sores.  He hopefully will play some golf this week.  The weather is springlike and he wants to get out and play golf.  Overall, his performance status is ECOG 0.    Medications:  Allergies as of 05/15/2018   No Known Allergies     Medication List       Accurate as of May 15, 2018 10:13 AM. Always use your most recent med list.        ALEVE 220 MG Caps Generic drug:  Naproxen Sodium Take 220 mg by mouth 2 (two) times daily.   amLODipine 10 MG tablet Commonly known as:  NORVASC Take 10 mg by mouth daily.   amoxicillin 500 MG capsule Commonly known as:  AMOXIL Take 4 capsules 1 hour PRIOR to dental procedure   bisoprolol-hydrochlorothiazide 10-6.25 MG tablet Commonly known  as:  ZIAC Take 1 tablet by mouth daily.   Cinnamon 500 MG Tabs Take 1 tablet by mouth 2 (two) times daily.   dexamethasone 4 MG tablet Commonly known as:  DECADRON Take 2 tablets by mouth once a day on the day after chemotherapy and then take 2 tablets two times a day for 2 days. Take with food.   gabapentin 100 MG capsule Commonly known as:  NEURONTIN TAKE 3 CAPSULES (300 MG TOTAL) BY MOUTH 2 (TWO) TIMES DAILY.   lidocaine-prilocaine cream Commonly known as:  EMLA Apply to affected area once   LORazepam 0.5 MG tablet Commonly known as:  ATIVAN TAKE 1 TABLET (0.5 MG TOTAL) BY MOUTH EVERY 6 (SIX) HOURS AS NEEDED (NAUSEA OR VOMITING).   losartan 100 MG tablet Commonly known as:  COZAAR Take 100 mg by mouth daily.   MAGNESIUM PO Take 400 mg by mouth at bedtime. Leg cramps   ondansetron 8 MG tablet Commonly known as:  ZOFRAN Take 1 tablet (8 mg total) by mouth 2 (two) times daily as needed. Start on the third day after chemotherapy.   polyethylene glycol packet Commonly known as:  MIRALAX / GLYCOLAX Take 17 g by mouth 2 (two) times daily.   prochlorperazine 10 MG  tablet Commonly known as:  COMPAZINE TAKE 1 TABLET (10 MG TOTAL) BY MOUTH EVERY 6 (SIX) HOURS AS NEEDED (NAUSEA OR VOMITING).   senna 8.6 MG Tabs tablet Commonly known as:  SENOKOT Take 1 tablet (8.6 mg total) by mouth 2 (two) times daily.   vitamin B-6 250 MG tablet Take 1 tablet (250 mg total) by mouth daily.       Allergies: No Known Allergies  Past Medical History, Surgical history, Social history, and Family History were reviewed and updated.  Review of Systems: Review of Systems  Constitutional: Negative.   HENT: Negative.   Eyes: Negative.   Respiratory: Negative.   Cardiovascular: Negative.   Gastrointestinal: Negative.   Genitourinary: Negative.   Musculoskeletal: Negative.   Skin: Negative.   Neurological: Positive for tingling.  Endo/Heme/Allergies: Negative.     Psychiatric/Behavioral: Negative.      Physical Exam:  weight is 270 lb 8 oz (122.7 kg). His blood pressure is 110/94 (abnormal) and his pulse is 84. His respiration is 19 and oxygen saturation is 100%.   Wt Readings from Last 3 Encounters:  05/15/18 270 lb 8 oz (122.7 kg)  04/17/18 267 lb (121.1 kg)  03/13/18 265 lb (120.2 kg)    Physical Exam Vitals signs reviewed.  HENT:     Head: Normocephalic and atraumatic.  Eyes:     Pupils: Pupils are equal, round, and reactive to light.  Neck:     Musculoskeletal: Normal range of motion.  Cardiovascular:     Rate and Rhythm: Normal rate and regular rhythm.     Heart sounds: Normal heart sounds.  Pulmonary:     Effort: Pulmonary effort is normal.     Breath sounds: Normal breath sounds.  Abdominal:     General: Bowel sounds are normal.     Palpations: Abdomen is soft.  Musculoskeletal: Normal range of motion.        General: No tenderness or deformity.  Lymphadenopathy:     Cervical: No cervical adenopathy.  Skin:    General: Skin is warm and dry.     Findings: No erythema or rash.  Neurological:     Mental Status: He is alert and oriented to person, place, and time.  Psychiatric:        Behavior: Behavior normal.        Thought Content: Thought content normal.        Judgment: Judgment normal.      Lab Results  Component Value Date   WBC 5.3 05/15/2018   HGB 9.2 (L) 05/15/2018   HCT 28.6 (L) 05/15/2018   MCV 98.6 05/15/2018   PLT 208 05/15/2018   Lab Results  Component Value Date   FERRITIN 782 (H) 04/17/2018   IRON 89 04/17/2018   TIBC 251 04/17/2018   UIBC 162 04/17/2018   IRONPCTSAT 35 04/17/2018   Lab Results  Component Value Date   RBC 2.90 (L) 05/15/2018   No results found for: KPAFRELGTCHN, LAMBDASER, KAPLAMBRATIO No results found for: IGGSERUM, IGA, IGMSERUM No results found for: Marda Stalker, SPEI   Chemistry      Component Value Date/Time    NA 138 04/17/2018 1055   NA 141 02/21/2017 0902   K 4.0 04/17/2018 1055   K 4.1 02/21/2017 0902   CL 100 04/17/2018 1055   CL 98 02/21/2017 0902   CO2 29 04/17/2018 1055   CO2 31 02/21/2017 0902   BUN 27 (H) 04/17/2018 1055   BUN  17 02/21/2017 0902   CREATININE 1.41 (H) 04/17/2018 1055   CREATININE 1.2 02/21/2017 0902      Component Value Date/Time   CALCIUM 8.8 (L) 04/17/2018 1055   CALCIUM 9.2 02/21/2017 0902   ALKPHOS 44 04/17/2018 1055   ALKPHOS 65 02/21/2017 0902   AST 20 04/17/2018 1055   ALT 12 04/17/2018 1055   ALT 18 02/21/2017 0902   BILITOT 0.3 04/17/2018 1055       Impression and Plan: Mr. Joynt is a very pleasant 68 year old Caucasian gentleman with metastatic high grade bladder cancer.   I think that he is done incredibly well with the M-VAC protocol.  The last PET scan really shows that he is responding.  He has had very little toxicity from treatment.  We may consider adjusting his schedule a little bit so that he might be able to enjoy some of the spring weather that we are having.  I will plan to get him back in another month for follow-up.  I do not think he needs another PET scan probably until April or May.   Volanda Napoleon, MD 2/3/202010:13 AM

## 2018-05-16 ENCOUNTER — Other Ambulatory Visit: Payer: Self-pay

## 2018-05-16 ENCOUNTER — Inpatient Hospital Stay: Payer: PPO

## 2018-05-16 VITALS — BP 131/85 | HR 85 | Temp 98.4°F

## 2018-05-16 DIAGNOSIS — C772 Secondary and unspecified malignant neoplasm of intra-abdominal lymph nodes: Secondary | ICD-10-CM

## 2018-05-16 DIAGNOSIS — C679 Malignant neoplasm of bladder, unspecified: Secondary | ICD-10-CM

## 2018-05-16 DIAGNOSIS — Z5111 Encounter for antineoplastic chemotherapy: Secondary | ICD-10-CM | POA: Diagnosis not present

## 2018-05-16 MED ORDER — SODIUM CHLORIDE 0.9 % IV SOLN
56.0000 mg/m2 | Freq: Once | INTRAVENOUS | Status: AC
  Administered 2018-05-16: 141 mg via INTRAVENOUS
  Filled 2018-05-16: qty 100

## 2018-05-16 MED ORDER — PALONOSETRON HCL INJECTION 0.25 MG/5ML
INTRAVENOUS | Status: AC
  Filled 2018-05-16: qty 5

## 2018-05-16 MED ORDER — PEGFILGRASTIM 6 MG/0.6ML ~~LOC~~ PSKT
PREFILLED_SYRINGE | SUBCUTANEOUS | Status: AC
  Filled 2018-05-16: qty 0.6

## 2018-05-16 MED ORDER — HEPARIN SOD (PORK) LOCK FLUSH 100 UNIT/ML IV SOLN
500.0000 [IU] | Freq: Once | INTRAVENOUS | Status: AC | PRN
  Administered 2018-05-16: 500 [IU]
  Filled 2018-05-16: qty 5

## 2018-05-16 MED ORDER — VINBLASTINE SULFATE CHEMO INJECTION 1 MG/ML
5.0000 mg | Freq: Once | INTRAVENOUS | Status: AC
  Administered 2018-05-16: 5 mg via INTRAVENOUS
  Filled 2018-05-16: qty 5

## 2018-05-16 MED ORDER — DOXORUBICIN HCL CHEMO IV INJECTION 2 MG/ML
24.0000 mg/m2 | Freq: Once | INTRAVENOUS | Status: AC
  Administered 2018-05-16: 60 mg via INTRAVENOUS
  Filled 2018-05-16: qty 30

## 2018-05-16 MED ORDER — SODIUM CHLORIDE 0.9 % IV SOLN
Freq: Once | INTRAVENOUS | Status: AC
  Administered 2018-05-16: 11:00:00 via INTRAVENOUS
  Filled 2018-05-16: qty 5

## 2018-05-16 MED ORDER — PEGFILGRASTIM 6 MG/0.6ML ~~LOC~~ PSKT
6.0000 mg | PREFILLED_SYRINGE | Freq: Once | SUBCUTANEOUS | Status: AC
  Administered 2018-05-16: 6 mg via SUBCUTANEOUS

## 2018-05-16 MED ORDER — SODIUM CHLORIDE 0.9 % IV SOLN
Freq: Once | INTRAVENOUS | Status: AC
  Administered 2018-05-16: 08:00:00 via INTRAVENOUS
  Filled 2018-05-16: qty 250

## 2018-05-16 MED ORDER — POTASSIUM CHLORIDE 2 MEQ/ML IV SOLN
Freq: Once | INTRAVENOUS | Status: AC
  Administered 2018-05-16: 08:00:00 via INTRAVENOUS
  Filled 2018-05-16: qty 10

## 2018-05-16 MED ORDER — PALONOSETRON HCL INJECTION 0.25 MG/5ML
0.2500 mg | Freq: Once | INTRAVENOUS | Status: AC
  Administered 2018-05-16: 0.25 mg via INTRAVENOUS

## 2018-05-16 MED ORDER — SODIUM CHLORIDE 0.9% FLUSH
10.0000 mL | INTRAVENOUS | Status: DC | PRN
  Administered 2018-05-16: 10 mL
  Filled 2018-05-16: qty 10

## 2018-05-16 NOTE — Patient Instructions (Signed)
[facility] Discharge Instructions for Patients Receiving Chemotherapy  Today you received the following chemotherapy agents Cisplatin, Adriamycin,   To help prevent nausea and vomiting after your treatment, we encourage you to take your nausea medication    If you develop nausea and vomiting that is not controlled by your nausea medication, call the clinic.   BELOW ARE SYMPTOMS THAT SHOULD BE REPORTED IMMEDIATELY:  *FEVER GREATER THAN 100.5 F  *CHILLS WITH OR WITHOUT FEVER  NAUSEA AND VOMITING THAT IS NOT CONTROLLED WITH YOUR NAUSEA MEDICATION  *UNUSUAL SHORTNESS OF BREATH  *UNUSUAL BRUISING OR BLEEDING  TENDERNESS IN MOUTH AND THROAT WITH OR WITHOUT PRESENCE OF ULCERS  *URINARY PROBLEMS  *BOWEL PROBLEMS  UNUSUAL RASH Items with * indicate a potential emergency and should be followed up as soon as possible.  Feel free to call the clinic should you have any questions or concerns. The clinic phone number is [phone].  Please show the [facility] at check-in to the Emergency Department and triage nurse.

## 2018-05-16 NOTE — Progress Notes (Signed)
UOP 450 cc at 1000

## 2018-06-12 ENCOUNTER — Other Ambulatory Visit: Payer: Self-pay

## 2018-06-12 ENCOUNTER — Inpatient Hospital Stay (HOSPITAL_BASED_OUTPATIENT_CLINIC_OR_DEPARTMENT_OTHER): Payer: PPO | Admitting: Hematology & Oncology

## 2018-06-12 ENCOUNTER — Encounter: Payer: Self-pay | Admitting: Hematology & Oncology

## 2018-06-12 ENCOUNTER — Inpatient Hospital Stay: Payer: PPO

## 2018-06-12 ENCOUNTER — Other Ambulatory Visit: Payer: Self-pay | Admitting: *Deleted

## 2018-06-12 ENCOUNTER — Inpatient Hospital Stay: Payer: PPO | Attending: Hematology & Oncology

## 2018-06-12 VITALS — BP 118/70 | HR 82 | Temp 98.7°F | Resp 18 | Wt 264.5 lb

## 2018-06-12 DIAGNOSIS — I4891 Unspecified atrial fibrillation: Secondary | ICD-10-CM | POA: Insufficient documentation

## 2018-06-12 DIAGNOSIS — K5900 Constipation, unspecified: Secondary | ICD-10-CM | POA: Diagnosis not present

## 2018-06-12 DIAGNOSIS — C772 Secondary and unspecified malignant neoplasm of intra-abdominal lymph nodes: Secondary | ICD-10-CM

## 2018-06-12 DIAGNOSIS — D509 Iron deficiency anemia, unspecified: Secondary | ICD-10-CM | POA: Diagnosis not present

## 2018-06-12 DIAGNOSIS — D508 Other iron deficiency anemias: Secondary | ICD-10-CM

## 2018-06-12 DIAGNOSIS — Z5112 Encounter for antineoplastic immunotherapy: Secondary | ICD-10-CM | POA: Diagnosis not present

## 2018-06-12 DIAGNOSIS — Z79899 Other long term (current) drug therapy: Secondary | ICD-10-CM

## 2018-06-12 DIAGNOSIS — C679 Malignant neoplasm of bladder, unspecified: Secondary | ICD-10-CM

## 2018-06-12 DIAGNOSIS — Z5111 Encounter for antineoplastic chemotherapy: Secondary | ICD-10-CM

## 2018-06-12 LAB — RETICULOCYTES
Immature Retic Fract: 22.5 % — ABNORMAL HIGH (ref 2.3–15.9)
RBC.: 2.53 MIL/uL — ABNORMAL LOW (ref 4.22–5.81)
Retic Count, Absolute: 117.6 10*3/uL (ref 19.0–186.0)
Retic Ct Pct: 4.7 % — ABNORMAL HIGH (ref 0.4–3.1)

## 2018-06-12 LAB — CMP (CANCER CENTER ONLY)
ALT: 12 U/L (ref 0–44)
AST: 16 U/L (ref 15–41)
Albumin: 3.7 g/dL (ref 3.5–5.0)
Alkaline Phosphatase: 54 U/L (ref 38–126)
Anion gap: 8 (ref 5–15)
BUN: 28 mg/dL — ABNORMAL HIGH (ref 8–23)
CO2: 30 mmol/L (ref 22–32)
Calcium: 8.7 mg/dL — ABNORMAL LOW (ref 8.9–10.3)
Chloride: 102 mmol/L (ref 98–111)
Creatinine: 1.57 mg/dL — ABNORMAL HIGH (ref 0.61–1.24)
GFR, Est AFR Am: 52 mL/min — ABNORMAL LOW (ref >60)
GFR, Estimated: 45 mL/min — ABNORMAL LOW (ref >60)
Glucose, Bld: 221 mg/dL — ABNORMAL HIGH (ref 70–99)
Potassium: 4 mmol/L (ref 3.5–5.1)
Sodium: 140 mmol/L (ref 135–145)
Total Bilirubin: 0.4 mg/dL (ref 0.3–1.2)
Total Protein: 6.1 g/dL — ABNORMAL LOW (ref 6.5–8.1)

## 2018-06-12 LAB — CBC WITH DIFFERENTIAL (CANCER CENTER ONLY)
Abs Immature Granulocytes: 0.09 10*3/uL — ABNORMAL HIGH (ref 0.00–0.07)
Basophils Absolute: 0 10*3/uL (ref 0.0–0.1)
Basophils Relative: 1 %
Eosinophils Absolute: 0 10*3/uL (ref 0.0–0.5)
Eosinophils Relative: 0 %
HCT: 25 % — ABNORMAL LOW (ref 39.0–52.0)
Hemoglobin: 8 g/dL — ABNORMAL LOW (ref 13.0–17.0)
Immature Granulocytes: 1 %
Lymphocytes Relative: 21 %
Lymphs Abs: 1.4 10*3/uL (ref 0.7–4.0)
MCH: 31.6 pg (ref 26.0–34.0)
MCHC: 32 g/dL (ref 30.0–36.0)
MCV: 98.8 fL (ref 80.0–100.0)
Monocytes Absolute: 0.8 10*3/uL (ref 0.1–1.0)
Monocytes Relative: 13 %
Neutro Abs: 4.1 10*3/uL (ref 1.7–7.7)
Neutrophils Relative %: 64 %
Platelet Count: 156 10*3/uL (ref 150–400)
RBC: 2.53 MIL/uL — ABNORMAL LOW (ref 4.22–5.81)
RDW: 15.4 % (ref 11.5–15.5)
WBC Count: 6.4 10*3/uL (ref 4.0–10.5)
nRBC: 0 % (ref 0.0–0.2)

## 2018-06-12 LAB — IRON AND TIBC
Iron: 55 ug/dL (ref 42–163)
Saturation Ratios: 24 % (ref 20–55)
TIBC: 226 ug/dL (ref 202–409)
UIBC: 172 ug/dL (ref 117–376)

## 2018-06-12 LAB — ABO/RH: ABO/RH(D): A POS

## 2018-06-12 LAB — SAMPLE TO BLOOD BANK

## 2018-06-12 LAB — FERRITIN: Ferritin: 963 ng/mL — ABNORMAL HIGH (ref 24–336)

## 2018-06-12 LAB — PREPARE RBC (CROSSMATCH)

## 2018-06-12 MED ORDER — ONDANSETRON HCL 4 MG/2ML IJ SOLN
INTRAMUSCULAR | Status: AC
  Filled 2018-06-12: qty 4

## 2018-06-12 MED ORDER — ONDANSETRON HCL 4 MG/2ML IJ SOLN
8.0000 mg | Freq: Once | INTRAMUSCULAR | Status: AC
  Administered 2018-06-12: 8 mg via INTRAVENOUS

## 2018-06-12 MED ORDER — DEXAMETHASONE SODIUM PHOSPHATE 10 MG/ML IJ SOLN
10.0000 mg | Freq: Once | INTRAMUSCULAR | Status: AC
  Administered 2018-06-12: 10 mg via INTRAVENOUS

## 2018-06-12 MED ORDER — DEXAMETHASONE SODIUM PHOSPHATE 10 MG/ML IJ SOLN
INTRAMUSCULAR | Status: AC
  Filled 2018-06-12: qty 1

## 2018-06-12 MED ORDER — HEPARIN SOD (PORK) LOCK FLUSH 100 UNIT/ML IV SOLN
500.0000 [IU] | Freq: Once | INTRAVENOUS | Status: AC | PRN
  Administered 2018-06-12: 500 [IU]
  Filled 2018-06-12: qty 5

## 2018-06-12 MED ORDER — SODIUM CHLORIDE 0.9% FLUSH
10.0000 mL | Freq: Once | INTRAVENOUS | Status: AC
  Administered 2018-06-12: 10 mL
  Filled 2018-06-12: qty 10

## 2018-06-12 MED ORDER — SODIUM CHLORIDE 0.9 % IV SOLN
75.0000 mg | Freq: Once | INTRAVENOUS | Status: AC
  Administered 2018-06-12: 75 mg via INTRAVENOUS
  Filled 2018-06-12: qty 3

## 2018-06-12 MED ORDER — SODIUM CHLORIDE 0.9% FLUSH
10.0000 mL | INTRAVENOUS | Status: DC | PRN
  Administered 2018-06-12: 10 mL
  Filled 2018-06-12: qty 10

## 2018-06-12 MED ORDER — SODIUM CHLORIDE 0.9 % IV SOLN
Freq: Once | INTRAVENOUS | Status: AC
  Administered 2018-06-12: 09:00:00 via INTRAVENOUS
  Filled 2018-06-12: qty 250

## 2018-06-12 NOTE — Progress Notes (Signed)
Hematology and Oncology Follow Up Visit  [name] Rose [id number] [birth year] 68 y.o. 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 Taxotere 80mg /m2 IV q 3 wks - s/p cycle #3 - d/c due to progression  Current Therapy:   M-VAC - s/p cycle #7   Interim History: [name] Rose is here today for follow-up and treatment.  He is doing okay.  His problem is that he had some jaw issues after the last treatment.  He had a lot of pain and soreness with his jaw and mouth.  Because of this, I think that we may have to make an adjustment with his vinblastine dose.  His hemoglobin today is only 8.  He is going had to be transfused.  I suspect that his erythropoietin level is probably can be a little on the lower side.  I will have to check this.  His iron studies have been okay.  I do not think this is going to be a problem for [place].  His iron studies that we did on him a month ago showed a ferritin of 766 with an iron saturation of 30%.  His appetite has been good.  He has been playing golf.  He has had no problems with bowels or bladder.  He has little bit of constipation which he can take care of with over-the-counter agents.  Overall, his performance status is ECOG 0.    Medications:  Allergies as of 06/12/2018   No Known Allergies     Medication List       Accurate as of June 12, 2018  8:41 AM. Always use your most recent med list.        ALEVE 220 MG Caps Generic drug:  Naproxen Sodium Take 220 mg by mouth 2 (two) times daily.   amLODipine 10 MG tablet Commonly known as:  NORVASC Take 10 mg by mouth daily.   amoxicillin 500 MG capsule Commonly known as:  AMOXIL Take 4 capsules 1 hour PRIOR to dental procedure   bisoprolol-hydrochlorothiazide 10-6.25 MG tablet Commonly known as:  ZIAC Take 1 tablet by mouth daily.     Cinnamon 500 MG Tabs Take 1 tablet by mouth 2 (two) times daily.   dexamethasone 4 MG tablet Commonly known as:  DECADRON Take 2 tablets by mouth once a day on the day after chemotherapy and then take 2 tablets two times a day for 2 days. Take with food.   gabapentin 100 MG capsule Commonly known as:  NEURONTIN TAKE 3 CAPSULES (300 MG TOTAL) BY MOUTH 2 (TWO) TIMES DAILY.   lidocaine-prilocaine cream Commonly known as:  EMLA Apply to affected area once   LORazepam 0.5 MG tablet Commonly known as:  ATIVAN TAKE 1 TABLET (0.5 MG TOTAL) BY MOUTH EVERY 6 (SIX) HOURS AS NEEDED (NAUSEA OR VOMITING).   losartan 100 MG tablet Commonly known as:  COZAAR Take 100 mg by mouth daily.   MAGNESIUM PO Take 400 mg by mouth at bedtime. Leg cramps   ondansetron 8 MG tablet Commonly known as:  ZOFRAN Take 1 tablet (8 mg total) by mouth 2 (two) times daily as needed. Start on the third day after chemotherapy.   polyethylene glycol packet Commonly known as:  MIRALAX / GLYCOLAX Take 17 g by mouth 2 (two) times daily.   prochlorperazine 10 MG tablet Commonly known as:  COMPAZINE TAKE 1 TABLET (10 MG TOTAL) BY MOUTH EVERY 6 (SIX) HOURS AS NEEDED (NAUSEA OR VOMITING).   senna 8.6 MG Tabs tablet Commonly known as:  SENOKOT Take 1 tablet (8.6 mg total) by mouth 2 (two) times daily.   vitamin B-6 250 MG tablet Take 1 tablet (250 mg total) by mouth daily.       Allergies: No Known Allergies  Past Medical History, Surgical history, Social history, and Family History were reviewed and updated.  Review of Systems: Review of Systems  Constitutional: Negative.   HENT: Negative.   Eyes: Negative.   Respiratory: Negative.   Cardiovascular: Negative.   Gastrointestinal: Negative.   Genitourinary: Negative.   Musculoskeletal: Negative.   Skin: Negative.   Neurological: Positive for tingling.  Endo/Heme/Allergies: Negative.   Psychiatric/Behavioral: Negative.      Physical Exam:   weight is 264 lb 8 oz (120 kg). His oral temperature is 98.7 F (37.1 C). His blood pressure is 118/70 and his pulse is 82. His respiration is 18 and oxygen saturation is 99%.   Wt Readings from Last 3 Encounters:  06/12/18 264 lb 8 oz (120 kg)  05/15/18 270 lb 8 oz (122.7 kg)  04/17/18 267 lb (121.1 kg)    Physical Exam Vitals signs reviewed.  HENT:     Head: Normocephalic and atraumatic.  Eyes:     Pupils: Pupils are equal, round, and reactive to light.  Neck:     Musculoskeletal: Normal range of motion.  Cardiovascular:     Rate and Rhythm: Normal rate and regular rhythm.     Heart sounds: Normal heart sounds.  Pulmonary:     Effort: Pulmonary effort is normal.     Breath sounds: Normal breath sounds.  Abdominal:     General: Bowel sounds are normal.     Palpations: Abdomen is soft.  Musculoskeletal: Normal range of motion.        General: No tenderness or deformity.  Lymphadenopathy:     Cervical: No cervical adenopathy.  Skin:    General: Skin is warm and dry.     Findings: No erythema or rash.  Neurological:     Mental Status: He is alert and oriented to person, place, and time.  Psychiatric:        Behavior: Behavior normal.        Thought Content: Thought content normal.        Judgment: Judgment normal.      Lab Results  Component Value Date   WBC 5.3 05/15/2018   HGB 9.2 (L) 05/15/2018   HCT 28.6 (L) 05/15/2018   MCV 98.6 05/15/2018   PLT 208 05/15/2018   Lab Results  Component Value Date   FERRITIN 766 (H) 05/15/2018   IRON 78 05/15/2018   TIBC 259 05/15/2018   UIBC 181 05/15/2018   IRONPCTSAT 30 05/15/2018   Lab Results  Component Value Date   RBC 2.90 (L) 05/15/2018   No results found for: KPAFRELGTCHN, LAMBDASER, KAPLAMBRATIO No results found for: IGGSERUM, IGA, IGMSERUM No results found for: [name], SPEI   Chemistry      Component Value Date/Time   NA 139 05/15/2018 0930    NA 141 02/21/2017 0902   K 3.8 05/15/2018 0930   K 4.1 02/21/2017 0902   CL 102 05/15/2018 0930   CL 98 02/21/2017 0902   CO2 28 05/15/2018 0930   CO2 31 02/21/2017 0902   BUN 30 (H) 05/15/2018 0930  BUN 17 02/21/2017 0902   CREATININE 1.56 (H) 05/15/2018 0930   CREATININE 1.2 02/21/2017 0902      Component Value Date/Time   CALCIUM 9.1 05/15/2018 0930   CALCIUM 9.2 02/21/2017 0902   ALKPHOS 58 05/15/2018 0930   ALKPHOS 65 02/21/2017 0902   AST 18 05/15/2018 0930   ALT 12 05/15/2018 0930   ALT 18 02/21/2017 0902   BILITOT 0.3 05/15/2018 0930       Impression and Plan: Mr. Beeks is a very pleasant 68 year old Caucasian gentleman with metastatic high grade bladder cancer.   His last PET scan was done in January.  I probably will do another one after his next cycle.  This will be in April.  Again, we will have to transfuse him.  I talked him about the transfusion.  He understands why we need this.  I told him that this is from the chemotherapy.  I went over the risk and benefits.  I do think there is a risk to transfusing him.  I think there is a big benefit.  I spent about 35 minutes with him today.  All time spent face-to-face.  I had to set up the transfusion.  We will transfuse him on 06/14/2018.  I will set up his next appointment in 1 month.  Volanda Napoleon, MD 3/2/20208:41 AM

## 2018-06-12 NOTE — Progress Notes (Signed)
Ok to treat with creatinine 1.57 per [name].

## 2018-06-12 NOTE — Addendum Note (Signed)
Addended by: Amelia Jo I on: 06/12/2018 03:10 PM   Modules accepted: Orders

## 2018-06-12 NOTE — Patient Instructions (Signed)
[place] Discharge Instructions for Patients Receiving Chemotherapy  Today you received the following chemotherapy agents Methotrexate To help prevent nausea and vomiting after your treatment, we encourage you to take your nausea medication as prescribed.   If you develop nausea and vomiting that is not controlled by your nausea medication, call the clinic.   BELOW ARE SYMPTOMS THAT SHOULD BE REPORTED IMMEDIATELY:  *FEVER GREATER THAN 100.5 F  *CHILLS WITH OR WITHOUT FEVER  NAUSEA AND VOMITING THAT IS NOT CONTROLLED WITH YOUR NAUSEA MEDICATION  *UNUSUAL SHORTNESS OF BREATH  *UNUSUAL BRUISING OR BLEEDING  TENDERNESS IN MOUTH AND THROAT WITH OR WITHOUT PRESENCE OF ULCERS  *URINARY PROBLEMS  *BOWEL PROBLEMS  UNUSUAL RASH Items with * indicate a potential emergency and should be followed up as soon as possible.  Feel free to call the clinic should you have any questions or concerns. The clinic phone number is [phone].  Please show the [place] at check-in to the Emergency Department and triage nurse.

## 2018-06-12 NOTE — Patient Instructions (Signed)

## 2018-06-12 NOTE — Addendum Note (Signed)
Addended by: Amelia Jo I on: 06/12/2018 01:38 PM   Modules accepted: Orders

## 2018-06-13 ENCOUNTER — Inpatient Hospital Stay: Payer: PPO

## 2018-06-13 VITALS — BP 141/66 | HR 79 | Temp 98.3°F | Resp 18

## 2018-06-13 DIAGNOSIS — C772 Secondary and unspecified malignant neoplasm of intra-abdominal lymph nodes: Secondary | ICD-10-CM

## 2018-06-13 DIAGNOSIS — Z5111 Encounter for antineoplastic chemotherapy: Secondary | ICD-10-CM | POA: Diagnosis not present

## 2018-06-13 DIAGNOSIS — C679 Malignant neoplasm of bladder, unspecified: Secondary | ICD-10-CM

## 2018-06-13 MED ORDER — VINBLASTINE SULFATE CHEMO INJECTION 1 MG/ML: 2.4000 mg/m2 | Freq: Once | INTRAVENOUS | Status: DC

## 2018-06-13 MED ORDER — HEPARIN SOD (PORK) LOCK FLUSH 100 UNIT/ML IV SOLN
500.0000 [IU] | Freq: Once | INTRAVENOUS | Status: AC | PRN
  Administered 2018-06-13: 500 [IU]
  Filled 2018-06-13: qty 5

## 2018-06-13 MED ORDER — PEGFILGRASTIM 6 MG/0.6ML ~~LOC~~ PSKT
6.0000 mg | PREFILLED_SYRINGE | Freq: Once | SUBCUTANEOUS | Status: AC
  Administered 2018-06-13: 6 mg via SUBCUTANEOUS

## 2018-06-13 MED ORDER — PALONOSETRON HCL INJECTION 0.25 MG/5ML
INTRAVENOUS | Status: AC
  Filled 2018-06-13: qty 5

## 2018-06-13 MED ORDER — PEGFILGRASTIM 6 MG/0.6ML ~~LOC~~ PSKT
PREFILLED_SYRINGE | SUBCUTANEOUS | Status: AC
  Filled 2018-06-13: qty 0.6

## 2018-06-13 MED ORDER — VINBLASTINE SULFATE CHEMO INJECTION 1 MG/ML
1.6000 mg/m2 | Freq: Once | INTRAVENOUS | Status: AC
  Administered 2018-06-13: 4 mg via INTRAVENOUS
  Filled 2018-06-13: qty 4

## 2018-06-13 MED ORDER — SODIUM CHLORIDE 0.9 % IV SOLN
Freq: Once | INTRAVENOUS | Status: AC
  Administered 2018-06-13: 10:00:00 via INTRAVENOUS
  Filled 2018-06-13: qty 250

## 2018-06-13 MED ORDER — DOXORUBICIN HCL CHEMO IV INJECTION 2 MG/ML
24.0000 mg/m2 | Freq: Once | INTRAVENOUS | Status: AC
  Administered 2018-06-13: 60 mg via INTRAVENOUS
  Filled 2018-06-13: qty 30

## 2018-06-13 MED ORDER — PALONOSETRON HCL INJECTION 0.25 MG/5ML
0.2500 mg | Freq: Once | INTRAVENOUS | Status: AC
  Administered 2018-06-13: 0.25 mg via INTRAVENOUS

## 2018-06-13 MED ORDER — SODIUM CHLORIDE 0.9 % IV SOLN
Freq: Once | INTRAVENOUS | Status: AC
  Administered 2018-06-13: 12:00:00 via INTRAVENOUS
  Filled 2018-06-13: qty 5

## 2018-06-13 MED ORDER — SODIUM CHLORIDE 0.9% FLUSH
10.0000 mL | INTRAVENOUS | Status: DC | PRN
  Administered 2018-06-13: 10 mL
  Filled 2018-06-13: qty 10

## 2018-06-13 MED ORDER — POTASSIUM CHLORIDE 2 MEQ/ML IV SOLN
Freq: Once | INTRAVENOUS | Status: AC
  Administered 2018-06-13: 10:00:00 via INTRAVENOUS
  Filled 2018-06-13: qty 10

## 2018-06-13 MED ORDER — SODIUM CHLORIDE 0.9 % IV SOLN
56.0000 mg/m2 | Freq: Once | INTRAVENOUS | Status: AC
  Administered 2018-06-13: 141 mg via INTRAVENOUS
  Filled 2018-06-13: qty 100

## 2018-06-13 NOTE — Patient Instructions (Addendum)
[place] Discharge Instructions for Patients Receiving Chemotherapy  Today you received the following chemotherapy agents:  Cisplatin, Velban, and Adriamycin.  To help prevent nausea and vomiting after your treatment, we encourage you to take your nausea medications as indicated on the bottles.   If you develop nausea and vomiting that is not controlled by your nausea medication, call the clinic.   BELOW ARE SYMPTOMS THAT SHOULD BE REPORTED IMMEDIATELY:  *FEVER GREATER THAN 100.5 F  *CHILLS WITH OR WITHOUT FEVER  NAUSEA AND VOMITING THAT IS NOT CONTROLLED WITH YOUR NAUSEA MEDICATION  *UNUSUAL SHORTNESS OF BREATH  *UNUSUAL BRUISING OR BLEEDING  TENDERNESS IN MOUTH AND THROAT WITH OR WITHOUT PRESENCE OF ULCERS  *URINARY PROBLEMS  *BOWEL PROBLEMS  UNUSUAL RASH Items with * indicate a potential emergency and should be followed up as soon as possible.  Feel free to call the clinic should you have any questions or concerns. The clinic phone number is [phone].  Please show the [facility] at check-in to the Emergency Department and triage nurse.  Pegfilgrastim injection What is this medicine? PEGFILGRASTIM (PEG fil gra stim) is a long-acting granulocyte colony-stimulating factor that stimulates the growth of neutrophils, a type of white blood cell important in the body's fight against infection. It is used to reduce the incidence of fever and infection in patients with certain types of cancer who are receiving chemotherapy that affects the bone marrow, and to increase survival after being exposed to high doses of radiation. This medicine may be used for other purposes; ask your health care provider or pharmacist if you have questions. COMMON BRAND NAME(S): Domenic Moras, UDENYCA What should I tell my health care provider before I take this medicine? They need to know if you have any of these conditions: -kidney disease -latex allergy -ongoing  radiation therapy -sickle cell disease -skin reactions to acrylic adhesives (On-Body Injector only) -an unusual or allergic reaction to pegfilgrastim, filgrastim, other medicines, foods, dyes, or preservatives -pregnant or trying to get pregnant -breast-feeding How should I use this medicine? This medicine is for injection under the skin. If you get this medicine at home, you will be taught how to prepare and give the pre-filled syringe or how to use the On-body Injector. Refer to the patient Instructions for Use for detailed instructions. Use exactly as directed. Tell your healthcare provider immediately if you suspect that the On-body Injector may not have performed as intended or if you suspect the use of the On-body Injector resulted in a missed or partial dose. It is important that you put your used needles and syringes in a special sharps container. Do not put them in a trash can. If you do not have a sharps container, call your pharmacist or healthcare provider to get one. Talk to your pediatrician regarding the use of this medicine in children. While this drug may be prescribed for selected conditions, precautions do apply. Overdosage: If you think you have taken too much of this medicine contact a poison control center or emergency room at once. NOTE: This medicine is only for you. Do not share this medicine with others. What if I miss a dose? It is important not to miss your dose. Call your doctor or health care professional if you miss your dose. If you miss a dose due to an On-body Injector failure or leakage, a new dose should be administered as soon as possible using a single prefilled syringe for manual use. What may interact with this  medicine? Interactions have not been studied. Give your health care provider a list of all the medicines, herbs, non-prescription drugs, or dietary supplements you use. Also tell them if you smoke, drink alcohol, or use illegal drugs. Some items may  interact with your medicine. This list may not describe all possible interactions. Give your health care provider a list of all the medicines, herbs, non-prescription drugs, or dietary supplements you use. Also tell them if you smoke, drink alcohol, or use illegal drugs. Some items may interact with your medicine. What should I watch for while using this medicine? You may need blood work done while you are taking this medicine. If you are going to need a MRI, CT scan, or other procedure, tell your doctor that you are using this medicine (On-Body Injector only). What side effects may I notice from receiving this medicine? Side effects that you should report to your doctor or health care professional as soon as possible: -allergic reactions like skin rash, itching or hives, swelling of the face, lips, or tongue -back pain -dizziness -fever -pain, redness, or irritation at site where injected -pinpoint red spots on the skin -red or dark-brown urine -shortness of breath or breathing problems -stomach or side pain, or pain at the shoulder -swelling -tiredness -trouble passing urine or change in the amount of urine Side effects that usually do not require medical attention (report to your doctor or health care professional if they continue or are bothersome): -bone pain -muscle pain This list may not describe all possible side effects. Call your doctor for medical advice about side effects. You may report side effects to FDA at 1-800-FDA-1088. Where should I keep my medicine? Keep out of the reach of children. If you are using this medicine at home, you will be instructed on how to store it. Throw away any unused medicine after the expiration date on the label. NOTE: This sheet is a summary. It may not cover all possible information. If you have questions about this medicine, talk to your doctor, pharmacist, or health care provider.  2019 Elsevier/Gold Standard (2017-07-04 16:57:08)

## 2018-06-14 ENCOUNTER — Inpatient Hospital Stay: Payer: PPO

## 2018-06-14 ENCOUNTER — Other Ambulatory Visit: Payer: Self-pay

## 2018-06-14 DIAGNOSIS — C679 Malignant neoplasm of bladder, unspecified: Secondary | ICD-10-CM

## 2018-06-14 DIAGNOSIS — Z5111 Encounter for antineoplastic chemotherapy: Secondary | ICD-10-CM | POA: Diagnosis not present

## 2018-06-14 DIAGNOSIS — C772 Secondary and unspecified malignant neoplasm of intra-abdominal lymph nodes: Secondary | ICD-10-CM

## 2018-06-14 MED ORDER — SODIUM CHLORIDE 0.9% FLUSH
10.0000 mL | INTRAVENOUS | Status: AC | PRN
  Administered 2018-06-14: 10 mL
  Filled 2018-06-14: qty 10

## 2018-06-14 MED ORDER — SODIUM CHLORIDE 0.9% IV SOLUTION
250.0000 mL | Freq: Once | INTRAVENOUS | Status: AC
  Administered 2018-06-14: 250 mL via INTRAVENOUS
  Filled 2018-06-14: qty 250

## 2018-06-14 MED ORDER — HEPARIN SOD (PORK) LOCK FLUSH 100 UNIT/ML IV SOLN
500.0000 [IU] | Freq: Every day | INTRAVENOUS | Status: AC | PRN
  Administered 2018-06-14: 500 [IU]
  Filled 2018-06-14: qty 5

## 2018-06-14 NOTE — Patient Instructions (Signed)

## 2018-06-15 ENCOUNTER — Other Ambulatory Visit: Payer: Self-pay | Admitting: Hematology & Oncology

## 2018-06-15 LAB — BPAM RBC
Blood Product Expiration Date: 202003222359
Blood Product Expiration Date: 202003222359
ISSUE DATE / TIME: 202003040733
ISSUE DATE / TIME: 202003040733
Unit Type and Rh: 6200
Unit Type and Rh: 6200

## 2018-06-15 LAB — TYPE AND SCREEN
ABO/RH(D): A POS
Antibody Screen: NEGATIVE
Unit division: 0
Unit division: 0

## 2018-06-16 ENCOUNTER — Other Ambulatory Visit: Payer: Self-pay | Admitting: Hematology & Oncology

## 2018-06-16 DIAGNOSIS — C772 Secondary and unspecified malignant neoplasm of intra-abdominal lymph nodes: Secondary | ICD-10-CM

## 2018-06-16 DIAGNOSIS — C679 Malignant neoplasm of bladder, unspecified: Secondary | ICD-10-CM

## 2018-06-21 ENCOUNTER — Other Ambulatory Visit: Payer: Self-pay

## 2018-06-21 ENCOUNTER — Emergency Department (HOSPITAL_COMMUNITY): Payer: PPO

## 2018-06-21 ENCOUNTER — Inpatient Hospital Stay (HOSPITAL_COMMUNITY)
Admission: EM | Admit: 2018-06-21 | Discharge: 2018-06-29 | DRG: 871 | Disposition: A | Discharge status: Home Health | Payer: PPO | Attending: Internal Medicine | Admitting: Internal Medicine

## 2018-06-21 ENCOUNTER — Encounter (HOSPITAL_COMMUNITY): Payer: Self-pay | Admitting: Emergency Medicine

## 2018-06-21 DIAGNOSIS — R509 Fever, unspecified: Secondary | ICD-10-CM

## 2018-06-21 DIAGNOSIS — D61818 Other pancytopenia: Secondary | ICD-10-CM

## 2018-06-21 DIAGNOSIS — R5081 Fever presenting with conditions classified elsewhere: Secondary | ICD-10-CM | POA: Diagnosis not present

## 2018-06-21 DIAGNOSIS — T361X5A Adverse effect of cephalosporins and other beta-lactam antibiotics, initial encounter: Secondary | ICD-10-CM | POA: Diagnosis not present

## 2018-06-21 DIAGNOSIS — R319 Hematuria, unspecified: Secondary | ICD-10-CM | POA: Diagnosis present

## 2018-06-21 DIAGNOSIS — N179 Acute kidney failure, unspecified: Secondary | ICD-10-CM | POA: Diagnosis not present

## 2018-06-21 DIAGNOSIS — D61810 Antineoplastic chemotherapy induced pancytopenia: Secondary | ICD-10-CM | POA: Diagnosis present

## 2018-06-21 DIAGNOSIS — N183 Chronic kidney disease, stage 3 (moderate): Secondary | ICD-10-CM | POA: Diagnosis present

## 2018-06-21 DIAGNOSIS — L270 Generalized skin eruption due to drugs and medicaments taken internally: Secondary | ICD-10-CM | POA: Diagnosis not present

## 2018-06-21 DIAGNOSIS — R21 Rash and other nonspecific skin eruption: Secondary | ICD-10-CM | POA: Diagnosis not present

## 2018-06-21 DIAGNOSIS — Z906 Acquired absence of other parts of urinary tract: Secondary | ICD-10-CM | POA: Diagnosis not present

## 2018-06-21 DIAGNOSIS — E785 Hyperlipidemia, unspecified: Secondary | ICD-10-CM

## 2018-06-21 DIAGNOSIS — Y92230 Patient room in hospital as the place of occurrence of the external cause: Secondary | ICD-10-CM | POA: Diagnosis not present

## 2018-06-21 DIAGNOSIS — R739 Hyperglycemia, unspecified: Secondary | ICD-10-CM

## 2018-06-21 DIAGNOSIS — I482 Chronic atrial fibrillation, unspecified: Secondary | ICD-10-CM | POA: Diagnosis not present

## 2018-06-21 DIAGNOSIS — R911 Solitary pulmonary nodule: Secondary | ICD-10-CM | POA: Diagnosis not present

## 2018-06-21 DIAGNOSIS — R296 Repeated falls: Secondary | ICD-10-CM | POA: Diagnosis present

## 2018-06-21 DIAGNOSIS — A4101 Sepsis due to Methicillin susceptible Staphylococcus aureus: Principal | ICD-10-CM | POA: Diagnosis present

## 2018-06-21 DIAGNOSIS — E669 Obesity, unspecified: Secondary | ICD-10-CM | POA: Diagnosis present

## 2018-06-21 DIAGNOSIS — R42 Dizziness and giddiness: Secondary | ICD-10-CM | POA: Diagnosis not present

## 2018-06-21 DIAGNOSIS — T451X5A Adverse effect of antineoplastic and immunosuppressive drugs, initial encounter: Secondary | ICD-10-CM | POA: Diagnosis not present

## 2018-06-21 DIAGNOSIS — E876 Hypokalemia: Secondary | ICD-10-CM | POA: Diagnosis not present

## 2018-06-21 DIAGNOSIS — Z6834 Body mass index (BMI) 34.0-34.9, adult: Secondary | ICD-10-CM

## 2018-06-21 DIAGNOSIS — Z87891 Personal history of nicotine dependence: Secondary | ICD-10-CM

## 2018-06-21 DIAGNOSIS — E860 Dehydration: Secondary | ICD-10-CM | POA: Diagnosis present

## 2018-06-21 DIAGNOSIS — I480 Paroxysmal atrial fibrillation: Secondary | ICD-10-CM

## 2018-06-21 DIAGNOSIS — I129 Hypertensive chronic kidney disease with stage 1 through stage 4 chronic kidney disease, or unspecified chronic kidney disease: Secondary | ICD-10-CM | POA: Diagnosis present

## 2018-06-21 DIAGNOSIS — K1231 Oral mucositis (ulcerative) due to antineoplastic therapy: Secondary | ICD-10-CM

## 2018-06-21 DIAGNOSIS — K5900 Constipation, unspecified: Secondary | ICD-10-CM | POA: Diagnosis not present

## 2018-06-21 DIAGNOSIS — R7881 Bacteremia: Secondary | ICD-10-CM

## 2018-06-21 DIAGNOSIS — I10 Essential (primary) hypertension: Secondary | ICD-10-CM

## 2018-06-21 DIAGNOSIS — B9561 Methicillin susceptible Staphylococcus aureus infection as the cause of diseases classified elsewhere: Secondary | ICD-10-CM

## 2018-06-21 DIAGNOSIS — K121 Other forms of stomatitis: Secondary | ICD-10-CM | POA: Diagnosis not present

## 2018-06-21 DIAGNOSIS — J189 Pneumonia, unspecified organism: Secondary | ICD-10-CM | POA: Insufficient documentation

## 2018-06-21 DIAGNOSIS — C772 Secondary and unspecified malignant neoplasm of intra-abdominal lymph nodes: Secondary | ICD-10-CM | POA: Diagnosis not present

## 2018-06-21 DIAGNOSIS — Z825 Family history of asthma and other chronic lower respiratory diseases: Secondary | ICD-10-CM

## 2018-06-21 DIAGNOSIS — R627 Adult failure to thrive: Secondary | ICD-10-CM | POA: Diagnosis not present

## 2018-06-21 DIAGNOSIS — I959 Hypotension, unspecified: Secondary | ICD-10-CM | POA: Diagnosis not present

## 2018-06-21 DIAGNOSIS — D696 Thrombocytopenia, unspecified: Secondary | ICD-10-CM

## 2018-06-21 DIAGNOSIS — I4891 Unspecified atrial fibrillation: Secondary | ICD-10-CM

## 2018-06-21 DIAGNOSIS — D6959 Other secondary thrombocytopenia: Secondary | ICD-10-CM | POA: Diagnosis present

## 2018-06-21 DIAGNOSIS — R531 Weakness: Secondary | ICD-10-CM | POA: Diagnosis not present

## 2018-06-21 DIAGNOSIS — N182 Chronic kidney disease, stage 2 (mild): Secondary | ICD-10-CM | POA: Diagnosis not present

## 2018-06-21 DIAGNOSIS — A419 Sepsis, unspecified organism: Secondary | ICD-10-CM | POA: Diagnosis not present

## 2018-06-21 DIAGNOSIS — C679 Malignant neoplasm of bladder, unspecified: Secondary | ICD-10-CM | POA: Diagnosis present

## 2018-06-21 DIAGNOSIS — D709 Neutropenia, unspecified: Secondary | ICD-10-CM | POA: Diagnosis not present

## 2018-06-21 DIAGNOSIS — R059 Cough, unspecified: Secondary | ICD-10-CM

## 2018-06-21 DIAGNOSIS — Z9079 Acquired absence of other genital organ(s): Secondary | ICD-10-CM

## 2018-06-21 DIAGNOSIS — R05 Cough: Secondary | ICD-10-CM | POA: Diagnosis not present

## 2018-06-21 DIAGNOSIS — C799 Secondary malignant neoplasm of unspecified site: Secondary | ICD-10-CM | POA: Diagnosis not present

## 2018-06-21 DIAGNOSIS — B965 Pseudomonas (aeruginosa) (mallei) (pseudomallei) as the cause of diseases classified elsewhere: Secondary | ICD-10-CM

## 2018-06-21 DIAGNOSIS — K1230 Oral mucositis (ulcerative), unspecified: Secondary | ICD-10-CM | POA: Diagnosis not present

## 2018-06-21 DIAGNOSIS — Z8551 Personal history of malignant neoplasm of bladder: Secondary | ICD-10-CM | POA: Diagnosis not present

## 2018-06-21 DIAGNOSIS — Z95828 Presence of other vascular implants and grafts: Secondary | ICD-10-CM

## 2018-06-21 DIAGNOSIS — D508 Other iron deficiency anemias: Secondary | ICD-10-CM

## 2018-06-21 DIAGNOSIS — J302 Other seasonal allergic rhinitis: Secondary | ICD-10-CM | POA: Diagnosis not present

## 2018-06-21 DIAGNOSIS — E1165 Type 2 diabetes mellitus with hyperglycemia: Secondary | ICD-10-CM | POA: Diagnosis not present

## 2018-06-21 DIAGNOSIS — R7303 Prediabetes: Secondary | ICD-10-CM | POA: Diagnosis present

## 2018-06-21 DIAGNOSIS — R3915 Urgency of urination: Secondary | ICD-10-CM | POA: Diagnosis not present

## 2018-06-21 DIAGNOSIS — Z79899 Other long term (current) drug therapy: Secondary | ICD-10-CM

## 2018-06-21 DIAGNOSIS — Z8249 Family history of ischemic heart disease and other diseases of the circulatory system: Secondary | ICD-10-CM

## 2018-06-21 DIAGNOSIS — Z8546 Personal history of malignant neoplasm of prostate: Secondary | ICD-10-CM

## 2018-06-21 DIAGNOSIS — D649 Anemia, unspecified: Secondary | ICD-10-CM | POA: Diagnosis not present

## 2018-06-21 DIAGNOSIS — R000 Tachycardia, unspecified: Secondary | ICD-10-CM | POA: Diagnosis not present

## 2018-06-21 DIAGNOSIS — R0602 Shortness of breath: Secondary | ICD-10-CM | POA: Diagnosis not present

## 2018-06-21 HISTORY — DX: Hyperlipidemia, unspecified: E78.5

## 2018-06-21 HISTORY — DX: Essential (primary) hypertension: I10

## 2018-06-21 HISTORY — DX: Hyperglycemia, unspecified: R73.9

## 2018-06-21 LAB — LACTIC ACID, PLASMA
Lactic Acid, Venous: 3.1 mmol/L (ref 0.5–1.9)
Lactic Acid, Venous: 2.5 mmol/L (ref 0.5–1.9)

## 2018-06-21 LAB — URINALYSIS, ROUTINE W REFLEX MICROSCOPIC
Bacteria, UA: NONE SEEN
Bilirubin Urine: NEGATIVE
Glucose, UA: 150 mg/dL — AB
Ketones, ur: NEGATIVE mg/dL
Leukocytes,Ua: NEGATIVE
Nitrite: NEGATIVE
Protein, ur: NEGATIVE mg/dL
RBC / HPF: >50 RBC/hpf — ABNORMAL HIGH (ref 0–5)
Specific Gravity, Urine: 1.015 (ref 1.005–1.030)
pH: 5 (ref 5.0–8.0)

## 2018-06-21 LAB — PROCALCITONIN: Procalcitonin: 1.08 ng/mL

## 2018-06-21 LAB — COMPREHENSIVE METABOLIC PANEL
ALT: 23 U/L (ref 0–44)
AST: 19 U/L (ref 15–41)
Albumin: 3.6 g/dL (ref 3.5–5.0)
Alkaline Phosphatase: 51 U/L (ref 38–126)
Anion gap: 13 (ref 5–15)
BUN: 58 mg/dL — ABNORMAL HIGH (ref 8–23)
CO2: 26 mmol/L (ref 22–32)
Calcium: 9 mg/dL (ref 8.9–10.3)
Chloride: 97 mmol/L — ABNORMAL LOW (ref 98–111)
Creatinine, Ser: 1.69 mg/dL — ABNORMAL HIGH (ref 0.61–1.24)
GFR calc Af Amer: 48 mL/min — ABNORMAL LOW (ref >60)
GFR calc non Af Amer: 41 mL/min — ABNORMAL LOW (ref >60)
Glucose, Bld: 213 mg/dL — ABNORMAL HIGH (ref 70–99)
Potassium: 4.1 mmol/L (ref 3.5–5.1)
Sodium: 136 mmol/L (ref 135–145)
Total Bilirubin: 1.8 mg/dL — ABNORMAL HIGH (ref 0.3–1.2)
Total Protein: 6.2 g/dL — ABNORMAL LOW (ref 6.5–8.1)

## 2018-06-21 LAB — MAGNESIUM: Magnesium: 1.5 mg/dL — ABNORMAL LOW (ref 1.7–2.4)

## 2018-06-21 LAB — TROPONIN I: Troponin I: <0.03 ng/mL (ref <0.03)

## 2018-06-21 LAB — MRSA PCR SCREENING: MRSA by PCR: NEGATIVE

## 2018-06-21 LAB — LIPASE, BLOOD: Lipase: 27 U/L (ref 11–51)

## 2018-06-21 MED ORDER — SODIUM CHLORIDE 0.9 % IV BOLUS (SEPSIS)
1000.0000 mL | Freq: Once | INTRAVENOUS | Status: AC
  Administered 2018-06-21: 1000 mL via INTRAVENOUS

## 2018-06-21 MED ORDER — VANCOMYCIN HCL 10 G IV SOLR
2000.0000 mg | Freq: Once | INTRAVENOUS | Status: AC
  Administered 2018-06-21: 2000 mg via INTRAVENOUS
  Filled 2018-06-21: qty 2000

## 2018-06-21 MED ORDER — SODIUM CHLORIDE 0.9 % IV SOLN
INTRAVENOUS | Status: DC
  Administered 2018-06-21 – 2018-06-23 (×5): via INTRAVENOUS

## 2018-06-21 MED ORDER — SODIUM CHLORIDE 0.9% IV SOLUTION: Freq: Once | INTRAVENOUS | Status: DC

## 2018-06-21 MED ORDER — PROMETHAZINE HCL 25 MG/ML IJ SOLN
12.5000 mg | Freq: Once | INTRAMUSCULAR | Status: DC
  Filled 2018-06-21 (×2): qty 1

## 2018-06-21 MED ORDER — VANCOMYCIN HCL 10 G IV SOLR
1250.0000 mg | INTRAVENOUS | Status: DC
  Filled 2018-06-21: qty 1250

## 2018-06-21 MED ORDER — MAGNESIUM SULFATE 2 GM/50ML IV SOLN
2.0000 g | Freq: Once | INTRAVENOUS | Status: AC
  Administered 2018-06-21: 2 g via INTRAVENOUS
  Filled 2018-06-21: qty 50

## 2018-06-21 MED ORDER — SODIUM CHLORIDE 0.9 % IV SOLN
2.0000 g | Freq: Once | INTRAVENOUS | Status: AC
  Administered 2018-06-21: 2 g via INTRAVENOUS
  Filled 2018-06-21: qty 2

## 2018-06-21 MED ORDER — SODIUM CHLORIDE 0.9 % IV BOLUS: 1000.0000 mL | Freq: Once | INTRAVENOUS | Status: DC

## 2018-06-21 MED ORDER — SODIUM CHLORIDE 0.9 % IV SOLN
2.0000 g | Freq: Two times a day (BID) | INTRAVENOUS | Status: DC
  Administered 2018-06-22: 2 g via INTRAVENOUS
  Filled 2018-06-21 (×2): qty 2

## 2018-06-21 MED ORDER — MAGIC MOUTHWASH W/LIDOCAINE
5.0000 mL | Freq: Four times a day (QID) | ORAL | Status: DC
  Administered 2018-06-21 – 2018-06-29 (×24): 5 mL via ORAL
  Filled 2018-06-21 (×32): qty 5

## 2018-06-21 MED ORDER — ACETAMINOPHEN 325 MG PO TABS
650.0000 mg | ORAL_TABLET | Freq: Once | ORAL | Status: AC
  Administered 2018-06-21: 650 mg via ORAL
  Filled 2018-06-21: qty 2

## 2018-06-21 MED ORDER — ACETAMINOPHEN 325 MG PO TABS
650.0000 mg | ORAL_TABLET | Freq: Four times a day (QID) | ORAL | Status: DC | PRN
  Administered 2018-06-21 – 2018-06-22 (×2): 650 mg via ORAL
  Filled 2018-06-21 (×2): qty 2

## 2018-06-21 MED ORDER — ONDANSETRON HCL 4 MG/2ML IJ SOLN
4.0000 mg | Freq: Once | INTRAMUSCULAR | Status: AC
  Administered 2018-06-21: 4 mg via INTRAVENOUS
  Filled 2018-06-21: qty 2

## 2018-06-21 NOTE — Progress Notes (Signed)
Patient arrived to 1224 via stretcher with ED RN. Patient in afib RVR rate in the 130-140s resting and up to the 160s with activity. Patient with coughing and active vomiting at transfer. Schorr, NP paged regarding afib and vomiting. See new orders. Patient oriented to environment. Will continue to closely monitor.

## 2018-06-21 NOTE — ED Notes (Signed)
Date and time results received: 06/21/18 1552 (use smartphrase ".now" to insert current time)  Test: wbc 0.2, Critical Value: plt 10.  Name of Provider Notified: Dr. Melina Copa  Orders Received? Or Actions Taken?: notified Dr Orlena Sheldon of WBC 0.2, and Plt 10.

## 2018-06-21 NOTE — H&P (Signed)
History and Physical    [name] Rose:[id number] DOB: Dec 03, 1950 DOA: 06/21/2018  PCP: Shirline Frees, MD Patient coming from: Home  Chief Complaint: Lightheaded generalized weakness  HPI: [name] Rose is a 68 y.o. male with medical history significant of stage IV bladder cancer going through chemotherapy his last chemotherapy was 224 2028 days prior to admission to the hospital.  Patient reports usually the second week after the chemotherapy he feels very weak but this is the first week after chemotherapy he has no appetite very decreased p.o. intake with lightheadedness and dizziness upon standing.  He is also not able to eat due to ulcers in his mouth.  Low-grade temp at home.  He does complain of nausea but no vomiting diarrhea or constipation.  Denies any cough shortness of breath.  He reported hematuria at home has not urinated since coming to the ER.    ED Course: Patient received cefepime sodium 136 potassium 4.1 BUN 58 creatinine 1.69 anion gap is 13 magnesium 1.5 lipase 27 total bilirubin 1.8 lactic acid level was 3.1 white count was 0.2 hemoglobin 10.7 platelet count 10.  EKG in the ER rapid A. fib  Review of Systems: As per HPI otherwise all other systems reviewed and are negative  Ambulatory Status:  Past Medical History:  Diagnosis Date  . Arthritis   . 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 ([place]) 09/29/2016  . Bladder tumor   . Goals of care, counseling/discussion 09/30/2016  . History of prostate cancer followed by pcp dr [name]-  per pt last PSA undetectable   dx 2008-- (Stage T1c, Gleason 3+3,  PSA 4.58, vol 99cc)  s/p  radical prostatectomy (nerve sparing bilateral)   . Hypertension   . Lower urinary tract symptoms (LUTS)   . Pre-diabetes   . Wears glasses     Past Surgical History:  Procedure Laterality Date  . CATARACT EXTRACTION W/ INTRAOCULAR LENS  IMPLANT, BILATERAL Bilateral 2011  . [place]  . IR FLUORO  GUIDE PORT INSERTION RIGHT  10/07/2016  . IR RADIOLOGIST EVAL & MGMT  01/05/2018  . IR US GUIDE VASC ACCESS RIGHT  10/07/2016  . KNEE ARTHROSCOPY Bilateral right 2006;  left 02-15-2007  . [place];  1990;  1983  . ROBOT ASSISTED LAPAROSCOPIC RADICAL PROSTATECTOMY  06/20/2006   bilateral nerve sparing  . TOTAL HIP ARTHROPLASTY Left 03/03/2015   Procedure: LEFT TOTAL HIP ARTHROPLASTY ANTERIOR APPROACH;  Surgeon: Dorna Leitz, MD;  Location: [facility];  Service: Orthopedics;  Laterality: Left;  . TOTAL KNEE ARTHROPLASTY Bilateral left 08-13-2009;  right 12-26-2009  . TRANSURETHRAL RESECTION OF BLADDER TUMOR N/A 09/20/2016   Procedure: TRANSURETHRAL RESECTION OF BLADDER TUMOR (TURBT);  Surgeon: Franchot Gallo, MD;  Location: [facility];  Service: Urology;  Laterality: N/A;    Social History   Socioeconomic History  . Marital status: Married    Spouse name: Not on file  . Number of children: Not on file  . Years of education: Not on file  . Highest education level: Not on file  Occupational History  . Not on file  Social Needs  . Financial resource strain: Not on file  . Food insecurity:    Worry: Not on file    Inability: Not on file  . Transportation needs:    Medical: Not on file    Non-medical: Not on file  Tobacco Use  . Smoking status: Former Smoker    Years: 16.00  Types: Cigarettes    Last attempt to quit: 09/25/1984    Years since quitting: 33.7  . Smokeless tobacco: Never Used  Substance and Sexual Activity  . Alcohol use: Yes    Comment: occasionally  . Drug use: No  . Sexual activity: Not on file  Lifestyle  . Physical activity:    Days per week: Not on file    Minutes per session: Not on file  . Stress: Not on file  Relationships  . Social connections:    Talks on phone: Not on file    Gets together: Not on file    Attends religious service: Not on file    Active member of club or organization: Not on file    Attends  meetings of clubs or organizations: Not on file    Relationship status: Not on file  . Intimate partner violence:    Fear of current or ex partner: Not on file    Emotionally abused: Not on file    Physically abused: Not on file    Forced sexual activity: Not on file  Other Topics Concern  . Not on file  Social History Narrative  . Not on file    No Known Allergies  Family History  Problem Relation Age of Onset  . Hypertension Mother   . Aneurysm Mother   . Emphysema Father   . Hypertension Father       Prior to Admission medications   Medication Sig Start Date End Date Taking? Authorizing Provider  amLODipine (NORVASC) 10 MG tablet Take 10 mg by mouth daily.   Yes [provider]  bisoprolol-hydrochlorothiazide (ZIAC) 10-6.25 MG tablet Take 1 tablet by mouth daily.   Yes [provider]  Cinnamon 500 MG TABS Take 1 tablet by mouth 2 (two) times daily.    Yes [provider]  dexamethasone (DECADRON) 4 MG tablet Take 2 tablets by mouth once a day on the day after chemotherapy and then take 2 tablets two times a day for 2 days. Take with food. 11/10/17  Yes Ennever, Rudell Cobb, MD  gabapentin (NEURONTIN) 100 MG capsule TAKE 3 CAPSULES (300 MG TOTAL) BY MOUTH 2 (TWO) TIMES DAILY. 03/20/18  Yes Volanda Napoleon, MD  lidocaine-prilocaine (EMLA) cream Apply to affected area once 11/10/17  Yes Ennever, Rudell Cobb, MD  LORazepam (ATIVAN) 0.5 MG tablet TAKE 1 TABLET (0.5 MG TOTAL) BY MOUTH EVERY 6 (SIX) HOURS AS NEEDED (NAUSEA OR VOMITING). 06/16/18  Yes Volanda Napoleon, MD  losartan (COZAAR) 100 MG tablet Take 100 mg by mouth daily.   Yes [provider]  MAGNESIUM PO Take 400 mg by mouth at bedtime. Leg cramps   Yes [provider]  Naproxen Sodium (ALEVE) 220 MG CAPS Take 220 mg by mouth 2 (two) times daily.    Yes [provider]  ondansetron (ZOFRAN) 8 MG tablet Take 1 tablet (8 mg total) by mouth 2 (two) times daily as needed. Start on the  third day after chemotherapy. 11/10/17  Yes Ennever, Rudell Cobb, MD  prochlorperazine (COMPAZINE) 10 MG tablet TAKE 1 TABLET (10 MG TOTAL) BY MOUTH EVERY 6 (SIX) HOURS AS NEEDED (NAUSEA OR VOMITING). 04/20/18  Yes Ennever, Rudell Cobb, MD  Pyridoxine HCl (VITAMIN B-6) 250 MG tablet Take 1 tablet (250 mg total) by mouth daily. 12/23/17  Yes Aline August, MD  senna (SENOKOT) 8.6 MG TABS tablet Take 1 tablet (8.6 mg total) by mouth 2 (two) times daily. 12/23/17  Yes Alekh, Kshitiz,  MD  amoxicillin (AMOXIL) 500 MG capsule Take 4 capsules 1 hour PRIOR to dental procedure Patient not taking: Reported on 06/21/2018 04/17/18   Volanda Napoleon, MD  polyethylene glycol (MIRALAX / Floria Raveling) packet Take 17 g by mouth 2 (two) times daily. Patient not taking: Reported on 06/21/2018 12/23/17   Aline August, MD    Physical Exam: Vitals:   06/21/18 1545 06/21/18 1600 06/21/18 1630 06/21/18 1705  BP:  93/60 93/67 120/73  Pulse: 77 (!) 130 (!) 138 (!) 109  Resp: $Remo'12 20 18 18  'rccLw$ Temp:      TempSrc:      SpO2: 99% 97% 97% 95%  Weight:      Height:         . General:  Appears in mild distress due to pain in the mouth oral mucosa extremely dry . Eyes:  PERRL, EOMI, normal lids, iris . ENT: Mucosal ulcers noted . Neck:  no LAD, masses or thyromegaly . Cardiovascular:  RRR, no m/r/g. No LE edema.  Marland Kitchen Respiratory: CTA bilaterally, no w/r/r. Normal respiratory effort. . Abdomen: soft, ntnd, NABS . Skin:  no rash or induration seen on limited exam . Musculoskeletal:  grossly normal tone BUE/BLE, good ROM, no bony abnormality . Psychiatric:  grossly normal mood and affect, speech fluent and appropriate, AOx3 . Neurologic:  CN 2-12 grossly intact, moves all extremities in coordinated fashion, sensation intact  Labs on Admission: I have personally reviewed following labs and imaging studies  CBC: Recent Labs  Lab 06/21/18 1438  WBC 0.2*  NEUTROABS PENDING  HGB 10.7*  HCT 32.1*  MCV 94.4  PLT 10*   Basic Metabolic  Panel: Recent Labs  Lab 06/21/18 1438  NA 136  K 4.1  CL 97*  CO2 26  GLUCOSE 213*  BUN 58*  CREATININE 1.69*  CALCIUM 9.0  MG 1.5*   GFR: Estimated Creatinine Clearance: 59.2 mL/min (A) (by C-G formula based on SCr of 1.69 mg/dL (H)). Liver Function Tests: Recent Labs  Lab 06/21/18 1438  AST 19  ALT 23  ALKPHOS 51  BILITOT 1.8*  PROT 6.2*  ALBUMIN 3.6   Recent Labs  Lab 06/21/18 1438  LIPASE 27   No results for input(s): AMMONIA in the last 168 hours. Coagulation Profile: No results for input(s): INR, PROTIME in the last 168 hours. Cardiac Enzymes: Recent Labs  Lab 06/21/18 1438  TROPONINI <0.03   BNP (last 3 results) No results for input(s): PROBNP in the last 8760 hours. HbA1C: No results for input(s): HGBA1C in the last 72 hours. CBG: No results for input(s): GLUCAP in the last 168 hours. Lipid Profile: No results for input(s): CHOL, HDL, LDLCALC, TRIG, CHOLHDL, LDLDIRECT in the last 72 hours. Thyroid Function Tests: No results for input(s): TSH, T4TOTAL, FREET4, T3FREE, THYROIDAB in the last 72 hours. Anemia Panel: No results for input(s): VITAMINB12, FOLATE, FERRITIN, TIBC, IRON, RETICCTPCT in the last 72 hours. Urine analysis:    Component Value Date/Time   COLORURINE YELLOW 05/14/2017 1952   APPEARANCEUR CLEAR 05/14/2017 1952   LABSPEC 1.020 05/14/2017 1952   PHURINE 7.0 05/14/2017 1952   GLUCOSEU NEGATIVE 05/14/2017 1952   HGBUR NEGATIVE 05/14/2017 1952   BILIRUBINUR NEGATIVE 05/14/2017 1952   KETONESUR NEGATIVE 05/14/2017 1952   PROTEINUR NEGATIVE 05/14/2017 1952   UROBILINOGEN 0.2 01/31/2010 0624   NITRITE NEGATIVE 05/14/2017 1952   LEUKOCYTESUR NEGATIVE 05/14/2017 1952    Creatinine Clearance: Estimated Creatinine Clearance: 59.2 mL/min (A) (by C-G formula based on SCr of 1.69  mg/dL (H)).  Sepsis Labs: $RemoveBefo'@LABRCNTIP'oegjsMATZnM$ (procalcitonin:4,lacticidven:4) )No results found for this or any previous visit (from the past 240 hour(s)).    Radiological Exams on Admission: Dg Chest Port 1 View  Result Date: 06/21/2018 CLINICAL DATA:  Fever and weakness EXAM: PORTABLE CHEST 1 VIEW COMPARISON:  None. FINDINGS: The heart size and mediastinal contours are within normal limits. Both lungs are clear. The visualized skeletal structures are unremarkable. There is a right chest wall power-injectable Port-A-Cath with tip in the lower SVC via a right internal jugular vein approach. IMPRESSION: No active disease. Electronically Signed   By: Ulyses Jarred M.D.   On: 06/21/2018 16:00    Assessment/Plan Active Problems:   * No active hospital problems. *   #1 neutropenic fever/sepsis in a patient with history of stage IV bladder cancer status post recent chemo.  At this time the source of infection is unclear UA has not been obtained yet since he has not had any urine output since coming to the ER continue IV fluids.  Blood cultures are drawn in the ER chest x-ray shows no acute changes.  I will cover him with Vanco and cefepime pharmacy consulted.  Patient presented with hypotension tachycardia and fever with severe neutropenia.  #2 pancytopenia secondary to recent chemo platelet count 10,000 patient had hematuria at home will transfuse 1 unit of platelets.  #3 mucositis we will treat with Magic mouthwash.  #4 hypotension secondary to severe dehydration and sepsis aggressive hydration hold antihypertensives patient takes Ziac and Norvasc and Cozaar at home.  #5 new onset atrial fibrillation secondary to #1 hopefully this will resolve with treating sepsis.  Obtain EKG.  Patient has no history of chronic atrial fibrillation.  Will not be given any anticoagulation for this due to severe thrombocytopenia.  #6 AKI secondary to dehydration continue IV fluids  #7 hypomagnesemia replete       Estimated body mass index is 34.02 kg/m as calculated from the following:   Height as of this encounter: $RemoveBeforeD'6\' 2"'EsSPMlZlHNinug$  (1.88 m).   Weight as of this encounter:  120.2 kg.   DVT prophylaxis: SCD Code Status: Full code Family Communication: Discussed with wife in the room Disposition Plan: Pending clinical improvement Consults called: ED physician spoke with [name] Admission status: Inpatient   Georgette Shell MD [facility]  If 7PM-7AM, please contact night-coverage [email or web address] Password [facility]  06/21/2018, 5:06 PM

## 2018-06-21 NOTE — ED Triage Notes (Signed)
Pt arrived via [facility] from home Generalized weakness and dizziness for approx a week Stage 4 bladder cancer going through chemo had chemo on 2/24 Usually has weakness post chemo but states its worse this time  Low grade fever

## 2018-06-21 NOTE — Progress Notes (Signed)
Patient febrile to 101.7. Platelet transfusion running. Platelets stopped. NS infusing in its place. No other symptoms. Prior to transfusion patient afebrile 98.9. Schorr, NP notified and stated that throughout patient's course of illness he has had fevers. Only 53ml remaining in transfusion. Transfusion to be completed. Will continue to closely monitor.

## 2018-06-21 NOTE — ED Notes (Signed)
ED TO INPATIENT HANDOFF REPORT  ED Nurse Name and Phone #: [phone] [name] Rose  S Name/Age/Gender [name] Rose 68 y.o. male Room/Bed: WA20/WA20  Code Status   Code Status: Full Code  Home/SNF/Other Home Patient oriented to: self Is this baseline? Yes   Triage Complete: Triage complete  Chief Complaint chemo pt;dizziness  Triage Note Pt arrived via [facility] from home Generalized weakness and dizziness for approx a week Stage 4 bladder cancer going through chemo had chemo on 2/24 Usually has weakness post chemo but states its worse this time  Low grade fever   Allergies No Known Allergies  Level of Care/Admitting Diagnosis ED Disposition    ED Disposition Condition Comment   [facility] Area: [place] [[id number]]  Level of Care: Stepdown [14]  Admit to SDU based on following criteria: Hemodynamic compromise or significant risk of instability:  Patient requiring short term acute titration and management of vasoactive drips, and invasive monitoring (i.e., CVP and Arterial line).  Diagnosis: Neutropenic fever [facility]) [[id number]]  Admitting Physician: Georgette Shell [[id number]]  Attending Physician: Georgette Shell [[id number]]  Estimated length of stay: 3 - 4 days  Certification:: I certify this patient will need inpatient services for at least 2 midnights  PT Class (Do Not Modify): Inpatient [101]  PT Acc Code (Do Not Modify): Private [1]       B Medical/Surgery History Past Medical History:  Diagnosis Date  . Arthritis   . 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 ([place]) 09/29/2016  . Bladder tumor   . Goals of care, counseling/discussion 09/30/2016  . History of prostate cancer followed by pcp dr [name]-  per pt last PSA undetectable   dx 2008-- (Stage T1c, Gleason 3+3,  PSA 4.58, vol 99cc)  s/p  radical prostatectomy (nerve sparing bilateral)   . Hypertension   . Lower urinary tract symptoms (LUTS)   . Pre-diabetes   .  Wears glasses    Past Surgical History:  Procedure Laterality Date  . CATARACT EXTRACTION W/ INTRAOCULAR LENS  IMPLANT, BILATERAL Bilateral 2011  . [place]  . IR FLUORO GUIDE PORT INSERTION RIGHT  10/07/2016  . IR RADIOLOGIST EVAL & MGMT  01/05/2018  . IR US GUIDE VASC ACCESS RIGHT  10/07/2016  . KNEE ARTHROSCOPY Bilateral right 2006;  left 02-15-2007  . [place];  1990;  1983  . ROBOT ASSISTED LAPAROSCOPIC RADICAL PROSTATECTOMY  06/20/2006   bilateral nerve sparing  . TOTAL HIP ARTHROPLASTY Left 03/03/2015   Procedure: LEFT TOTAL HIP ARTHROPLASTY ANTERIOR APPROACH;  Surgeon: Dorna Leitz, MD;  Location: [employer];  Service: Orthopedics;  Laterality: Left;  . TOTAL KNEE ARTHROPLASTY Bilateral left 08-13-2009;  right 12-26-2009  . TRANSURETHRAL RESECTION OF BLADDER TUMOR N/A 09/20/2016   Procedure: TRANSURETHRAL RESECTION OF BLADDER TUMOR (TURBT);  Surgeon: Franchot Gallo, MD;  Location[facility];  Service: Urology;  Laterality: N/A;     A IV Location/Drains/Wounds Patient Lines/Drains/Airways Status   Active Line/Drains/Airways    Name:   Placement date:   Placement time:   Site:   Days:   Implanted Port 10/07/16 Right Chest   10/07/16    1051    Chest   622   Peripheral IV 06/21/18 Right Antecubital   06/21/18    1437    Antecubital   less than 1   Peripheral IV 06/21/18 Right Forearm   06/21/18    1525    Forearm  less than 1   Closed System Drain 1 Right Abdomen Bulb (JP) 12 Fr.   12/19/17    1734    Abdomen   184   Incision (Closed) 03/03/15 Hip Left   03/03/15    1337     1206   Incision (Closed) 09/20/16 Penis Other (Comment)   09/20/16    0721     639          Intake/Output Last 24 hours No intake or output data in the 24 hours ending 06/21/18 1716  Labs/Imaging Results for orders placed or performed during the hospital encounter of 06/21/18 (from the past 48 hour(s))  Lactic acid, plasma     Status: Abnormal    Collection Time: 06/21/18  2:35 PM  Result Value Ref Range   Lactic Acid, Venous 3.1 (HH) 0.5 - 1.9 mmol/L    Comment: CRITICAL RESULT CALLED TO, READ BACK BY AND VERIFIED WITH: Ouida Sills [id number] @ [place] Performed at [facility] [address], [place], [place] [address]   Comprehensive metabolic panel     Status: Abnormal   Collection Time: 06/21/18  2:38 PM  Result Value Ref Range   Sodium 136 135 - 145 mmol/L   Potassium 4.1 3.5 - 5.1 mmol/L   Chloride 97 (L) 98 - 111 mmol/L   CO2 26 22 - 32 mmol/L   Glucose, Bld 213 (H) 70 - 99 mg/dL   BUN 58 (H) 8 - 23 mg/dL   Creatinine, Ser 1.69 (H) 0.61 - 1.24 mg/dL   Calcium 9.0 8.9 - 10.3 mg/dL   Total Protein 6.2 (L) 6.5 - 8.1 g/dL   Albumin 3.6 3.5 - 5.0 g/dL   AST 19 15 - 41 U/L   ALT 23 0 - 44 U/L   Alkaline Phosphatase 51 38 - 126 U/L   Total Bilirubin 1.8 (H) 0.3 - 1.2 mg/dL   GFR calc non Af Amer 41 (L) >60 mL/min   GFR calc Af Amer 48 (L) >60 mL/min   Anion gap 13 5 - 15    Comment: Performed at [facility], [place] [address] [place], [place] [address]  Lipase, blood     Status: None   Collection Time: 06/21/18  2:38 PM  Result Value Ref Range   Lipase 27 11 - 51 U/L    Comment: Performed at [facility], [place] [address], [place], [place] [address]  Troponin I - Once     Status: None   Collection Time: 06/21/18  2:38 PM  Result Value Ref Range   Troponin I <0.03 <0.03 ng/mL    Comment: Performed at [facility], [place] [address], [place], [place] [address]  CBC with Differential     Status: Abnormal (Preliminary result)   Collection Time: 06/21/18  2:38 PM  Result Value Ref Range   WBC 0.2 (LL) 4.0 - 10.5 K/uL    Comment: REPEATED TO VERIFY WHITE COUNT CONFIRMED ON SMEAR THIS CRITICAL RESULT HAS VERIFIED AND BEEN CALLED TO B.JESSEE BY NATHAN THOMPSON ON 03 11 2020 AT 5852, AND HAS BEEN READ BACK. CRITICAL RESULT VERIFIED    RBC  3.40 (L) 4.22 - 5.81 MIL/uL   Hemoglobin 10.7 (L) 13.0 - 17.0 g/dL   HCT 32.1 (L) 39.0 - 52.0 %   MCV 94.4 80.0 - 100.0 fL   MCH 31.5 26.0 - 34.0 pg   MCHC 33.3 30.0 - 36.0 g/dL   RDW 13.8  11.5 - 15.5 %   Platelets 10 (LL) 150 - 400 K/uL    Comment: REPEATED TO VERIFY PLATELET COUNT CONFIRMED BY SMEAR SPECIMEN CHECKED FOR CLOTS Immature Platelet Fraction may be clinically indicated, consider ordering this additional test [id number]    nRBC 0.0 0.0 - 0.2 %    Comment: Performed at [facility], [place] [address] [place], [place] [address]   Neutrophils Relative % PENDING %   Neutro Abs PENDING 1.7 - 7.7 K/uL   Band Neutrophils PENDING %   Lymphocytes Relative PENDING %   Lymphs Abs PENDING 0.7 - 4.0 K/uL   Monocytes Relative PENDING %   Monocytes Absolute PENDING 0.1 - 1.0 K/uL   Eosinophils Relative PENDING %   Eosinophils Absolute PENDING 0.0 - 0.5 K/uL   Basophils Relative PENDING %   Basophils Absolute PENDING 0.0 - 0.1 K/uL   WBC Morphology PENDING    RBC Morphology PENDING    Smear Review PENDING    Other PENDING %   nRBC PENDING 0 /100 WBC   Metamyelocytes Relative PENDING %   Myelocytes PENDING %   Promyelocytes Relative PENDING %   Blasts PENDING %  Magnesium     Status: Abnormal   Collection Time: 06/21/18  2:38 PM  Result Value Ref Range   Magnesium 1.5 (L) 1.7 - 2.4 mg/dL    Comment: Performed at [facility], [place] [address] [place], [place] [address]   Dg Chest Port 1 View  Result Date: 06/21/2018 CLINICAL DATA:  Fever and weakness EXAM: PORTABLE CHEST 1 VIEW COMPARISON:  None. FINDINGS: The heart size and mediastinal contours are within normal limits. Both lungs are clear. The visualized skeletal structures are unremarkable. There is a right chest wall power-injectable Port-A-Cath with tip in the lower SVC via a right internal jugular vein approach. IMPRESSION: No active disease. Electronically Signed   By: Ulyses Jarred  M.D.   On: 06/21/2018 16:00    Pending Labs Unresulted Labs (From admission, onward)    Start     Ordered   06/21/18 1711  Prepare Pheresed Platelets  (Adult Blood Administration - Platelets (Pheresed))  Once,   R    Question Answer Comment  Number of Apheresis Units 1 unit (6-10 packs)   Transfusion Indications PLT Count </=10,000/mm   If emergent release call blood bank [facility] [phone]      06/21/18 1710   06/21/18 1558  Type and screen [place]  Once,   STAT    Comments:  [place]    06/21/18 1557   06/21/18 1517  Lactic acid, plasma  Now then every 2 hours,   STAT     06/21/18 1516   06/21/18 1413  Culture, blood (routine x 2)  BLOOD CULTURE X 2,   STAT     06/21/18 1414   06/21/18 1413  Urinalysis, Routine w reflex microscopic  ONCE - STAT,   STAT     06/21/18 1414   06/21/18 1413  Urine culture  ONCE - STAT,   STAT     06/21/18 1414          Vitals/Pain Today's Vitals   06/21/18 1545 06/21/18 1600 06/21/18 1630 06/21/18 1705  BP:  93/60 93/67 120/73  Pulse: 77 (!) 130 (!) 138 (!) 109  Resp: $Remo'12 20 18 18  'FFjdP$ Temp:      TempSrc:      SpO2: 99% 97% 97% 95%  Weight:  Height:      PainSc:        Isolation Precautions No active isolations  Medications Medications  0.9 %  sodium chloride infusion (Manually program via Guardrails IV Fluids) (has no administration in time range)  acetaminophen (TYLENOL) tablet 650 mg (has no administration in time range)  ondansetron (ZOFRAN) injection 4 mg (4 mg Intravenous Given 06/21/18 1522)  sodium chloride 0.9 % bolus 1,000 mL (0 mLs Intravenous Stopped 06/21/18 1525)    And  sodium chloride 0.9 % bolus 1,000 mL (0 mLs Intravenous Stopped 06/21/18 1631)    And  sodium chloride 0.9 % bolus 1,000 mL (1,000 mLs Intravenous New Bag/Given 06/21/18 1631)    And  sodium chloride 0.9 % bolus 1,000 mL (1,000 mLs Intravenous New Bag/Given 06/21/18 1631)  acetaminophen (TYLENOL) tablet  650 mg (650 mg Oral Given 06/21/18 1520)  ceFEPIme (MAXIPIME) 2 g in sodium chloride 0.9 % 100 mL IVPB (0 g Intravenous Stopped 06/21/18 1631)    Mobility walks Low fall risk   Focused Assessments Cardiac Assessment Handoff:    Lab Results  Component Value Date   CKTOTAL 93 12/28/2009   TROPONINI <0.03 06/21/2018   No results found for: DDIMER Does the Patient currently have chest pain? No     R Recommendations: See Admitting Provider Note  Report given to:   Additional Notes:

## 2018-06-21 NOTE — Progress Notes (Signed)
Pharmacy Antibiotic Note  [name] Rose is a 68 y.o. male with a h/o stage IV bladder cancer admitted on 06/21/2018 with generalized weakness.  Pharmacy has been consulted for vancomycin and cefepime dosing.  Plan: Cefepime 2 g iv q 12h.   Vancomycin 2000 mg iv load.  Vancomycin 1250 mg IV Q 24 hrs. Goal AUC 400-550. Expected AUC: 454 SCr used: 1.69   Height: $Remove'6\' 2"'gHvgEvf$  (188 cm) Weight: 265 lb (120.2 kg) IBW/kg (Calculated) : 82.2  Temp (24hrs), Avg:102.2 F (39 C), Min:102.2 F (39 C), Max:102.2 F (39 C)  Recent Labs  Lab 06/21/18 1435 06/21/18 1438  WBC  --  0.2*  CREATININE  --  1.69*  LATICACIDVEN 3.1*  --     Estimated Creatinine Clearance: 59.2 mL/min (A) (by C-G formula based on SCr of 1.69 mg/dL (H)).    No Known Allergies  Antimicrobials this admission: 3/11 cefepime >>  3/11 vanc >>   Dose adjustments this admission:  Microbiology results: 3/11 BCx:  3/11 UCx:    Thank you for allowing pharmacy to be a part of this patient's care.  Ulice Dash D 06/21/2018 5:27 PM

## 2018-06-21 NOTE — ED Notes (Signed)
Date and time results received: 06/21/18 1815 (use smartphrase ".now" to insert current time)  Test: lactic acid Critical Value: 2.5  Name of Provider Notified: Chief Executive Officer RN  Orders Received? Or Actions Taken?: reported lactic acid 2.5 to [employer] RN to report to admitting doctor.

## 2018-06-21 NOTE — ED Notes (Signed)
Date and time results received: 06/21/18 1609 (use smartphrase ".now" to insert current time)  Test: lactic acid Critical Value: 3.1  Name of Provider Notified: Dr. Melina Copa  Orders Received? Or Actions Taken?: notified Dr Melina Copa lactic acid 3.1.

## 2018-06-21 NOTE — ED Provider Notes (Signed)
[facility] DEPT Provider Note   CSN: [id number] Arrival date & time: 06/21/18  1353    History   Chief Complaint Chief Complaint  Patient presents with  . Weakness    HPI [name] Rose is a 68 y.o. male.  He has a history of bladder cancer and is undergoing chemotherapy.  He follows with [name].  He is complaining of decreased appetite and mouth pain that he usually gets the week after his chemo.  He has not eaten anything since yesterday.  He feels generally weak and has had a couple of falls today.  He was noted to have a fever by EMS when they brought him in.  No cough no shortness of breath no abdominal pain.  He had one episode of hematuria last evening but that is improved.  He has a port in his right upper chest that has not been giving him any troubles.     The history is provided by the patient.  Weakness  Severity:  Severe Onset quality:  Gradual Timing:  Constant Progression:  Worsening Chronicity:  New Context: dehydration   Relieved by:  Nothing Worsened by:  Activity Ineffective treatments:  Drinking fluids Associated symptoms: diarrhea, difficulty walking, dizziness, falls, fever, nausea, near-syncope and vomiting   Associated symptoms: no abdominal pain, no chest pain, no cough, no dysuria, no numbness in extremities, no foul-smelling urine, no loss of consciousness, no melena, no shortness of breath and no stroke symptoms   Risk factors: anemia     Past Medical History:  Diagnosis Date  . Arthritis   . 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 ([place]) 09/29/2016  . Bladder tumor   . Goals of care, counseling/discussion 09/30/2016  . History of prostate cancer followed by pcp dr [name]-  per pt last PSA undetectable   dx 2008-- (Stage T1c, Gleason 3+3,  PSA 4.58, vol 99cc)  s/p  radical prostatectomy (nerve sparing bilateral)   . Hypertension   . Lower urinary tract symptoms (LUTS)   . Pre-diabetes   .  Wears glasses     Patient Active Problem List   Diagnosis Date Noted  . Abscess of abdominal cavity ([place]) 12/19/2017  . IDA (iron deficiency anemia) 09/08/2017  . 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 Anorexia 10/17/2016  . Diarrhea 10/17/2016  . LFT elevation 10/17/2016  . Dehydration 10/17/2016  . Fatigue 10/17/2016  . Hypocalcemia 10/17/2016  . Goals of care, counseling/discussion 09/30/2016  . 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 ([place]) 09/29/2016  . Cancer of dome of urinary bladder ([place]) 09/20/2016  . Postoperative anemia due to acute blood loss 03/04/2015  . Hypokalemia 03/04/2015  . 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 CATARACT EXTRACTION W/ INTRAOCULAR LENS  IMPLANT, BILATERAL Bilateral 2011  . [place]  . IR FLUORO GUIDE PORT INSERTION RIGHT  10/07/2016  . IR RADIOLOGIST EVAL & MGMT  01/05/2018  . IR US GUIDE VASC ACCESS RIGHT  10/07/2016  . KNEE ARTHROSCOPY Bilateral right 2006;  left 02-15-2007  . [place];  1990;  1983  . ROBOT ASSISTED LAPAROSCOPIC RADICAL PROSTATECTOMY  06/20/2006   bilateral nerve sparing  . TOTAL HIP ARTHROPLASTY Left 03/03/2015   Procedure: LEFT TOTAL HIP ARTHROPLASTY ANTERIOR APPROACH;  Surgeon: Dorna Leitz, MD;  Location: [facility];  Service: Orthopedics;  Laterality: Left;  .  TOTAL KNEE ARTHROPLASTY Bilateral left 08-13-2009;  right 12-26-2009  . TRANSURETHRAL RESECTION OF BLADDER TUMOR N/A 09/20/2016   Procedure: TRANSURETHRAL RESECTION OF BLADDER TUMOR (TURBT);  Surgeon: Franchot Gallo, MD;  Location: [facility];  Service: Urology;  Laterality: N/A;        Home Medications    Prior to Admission medications   Medication Sig Start Date End Date Taking? Authorizing Provider  amLODipine (NORVASC) 10 MG tablet Take 10 mg by mouth daily.    [provider]  amoxicillin (AMOXIL) 500 MG capsule Take 4 capsules 1 hour PRIOR to dental procedure 04/17/18   Volanda Napoleon, MD  bisoprolol-hydrochlorothiazide [facility]) 10-6.25 MG tablet Take 1 tablet by mouth daily.    [provider]  Cinnamon 500 MG TABS Take 1 tablet by mouth 2 (two) times daily.     [provider]  dexamethasone (DECADRON) 4 MG tablet Take 2 tablets by mouth once a day on the day after chemotherapy and then take 2 tablets two times a day for 2 days. Take with food. 11/10/17   Volanda Napoleon, MD  gabapentin (NEURONTIN) 100 MG capsule TAKE 3 CAPSULES (300 MG TOTAL) BY MOUTH 2 (TWO) TIMES DAILY. 03/20/18   Volanda Napoleon, MD  lidocaine-prilocaine (EMLA) cream Apply to affected area once 11/10/17   Ennever, Rudell Cobb, MD  LORazepam (ATIVAN) 0.5 MG tablet TAKE 1 TABLET (0.5 MG TOTAL) BY MOUTH EVERY 6 (SIX) HOURS AS NEEDED (NAUSEA OR VOMITING). 06/16/18   Volanda Napoleon, MD  losartan (COZAAR) 100 MG tablet Take 100 mg by mouth daily.    [provider]  MAGNESIUM PO Take 400 mg by mouth at bedtime. Leg cramps    [provider]  Naproxen Sodium (ALEVE) 220 MG CAPS Take 220 mg by mouth 2 (two) times daily.     [provider]  ondansetron (ZOFRAN) 8 MG tablet Take 1 tablet (8 mg total) by mouth 2 (two) times daily as needed. Start on the third day after chemotherapy. 11/10/17   Volanda Napoleon, MD  polyethylene glycol (MIRALAX / GLYCOLAX) packet Take 17 g by mouth 2 (two) times daily. 12/23/17   Aline August, MD  prochlorperazine (COMPAZINE) 10 MG tablet TAKE 1 TABLET (10 MG TOTAL) BY MOUTH EVERY 6 (SIX) HOURS AS NEEDED (NAUSEA OR VOMITING). 04/20/18   Volanda Napoleon, MD  Pyridoxine HCl (VITAMIN B-6) 250 MG tablet Take 1 tablet (250 mg total) by mouth daily. 12/23/17   Aline August, MD  senna (SENOKOT) 8.6 MG TABS tablet Take 1 tablet (8.6 mg total) by mouth 2 (two) times daily. 12/23/17   Aline August, MD    Family History Family  History  Problem Relation Age of Onset  . Hypertension Mother   . Aneurysm Mother   . Emphysema Father   . Hypertension Father     Social History Social History   Tobacco Use  . Smoking status: Former Smoker    Years: 16.00    Types: Cigarettes    Last attempt to quit: 09/25/1984    Years since quitting: 33.7  . Smokeless tobacco: Never Used  Substance Use Topics  . Alcohol use: Yes    Comment: occasionally  . Drug use: No     Allergies   Patient has no known allergies.   Review of Systems Review of Systems  Constitutional: Positive for diaphoresis, fatigue and fever.  HENT: Negative for sore throat.   Eyes: Negative for visual disturbance.  Respiratory:  Negative for cough and shortness of breath.   Cardiovascular: Positive for near-syncope. Negative for chest pain.  Gastrointestinal: Positive for constipation, diarrhea, nausea and vomiting. Negative for abdominal pain and melena.  Genitourinary: Positive for hematuria. Negative for dysuria.  Musculoskeletal: Positive for falls. Negative for neck pain.  Skin: Negative for rash.  Neurological: Positive for dizziness, weakness and light-headedness. Negative for loss of consciousness.     Physical Exam Updated Vital Signs SpO2 98%   Physical Exam Vitals signs and nursing note reviewed.  Constitutional:      Appearance: He is well-developed. He is diaphoretic.  HENT:     Head: Normocephalic and atraumatic.  Eyes:     Conjunctiva/sclera: Conjunctivae normal.  Neck:     Musculoskeletal: Neck supple.  Cardiovascular:     Rate and Rhythm: Tachycardia present. Rhythm irregular.     Pulses: Normal pulses.     Heart sounds: No murmur.  Pulmonary:     Effort: Pulmonary effort is normal. No respiratory distress.     Breath sounds: Normal breath sounds.  Abdominal:     Palpations: Abdomen is soft.     Tenderness: There is no abdominal tenderness.  Musculoskeletal: Normal range of motion.        General: No  tenderness or signs of injury.  Skin:    General: Skin is warm.     Capillary Refill: Capillary refill takes less than 2 seconds.     Findings: No rash.  Neurological:     General: No focal deficit present.     Mental Status: He is alert and oriented to person, place, and time.     Sensory: No sensory deficit.     Motor: No weakness.      ED Treatments / Results  Labs (all labs ordered are listed, but only abnormal results are displayed) Labs Reviewed  COMPREHENSIVE METABOLIC PANEL - Abnormal; Notable for the following components:      Result Value   Chloride 97 (*)    Glucose, Bld 213 (*)    BUN 58 (*)    Creatinine, Ser 1.69 (*)    Total Protein 6.2 (*)    Total Bilirubin 1.8 (*)    GFR calc non Af Amer 41 (*)    GFR calc Af Amer 48 (*)    All other components within normal limits  CBC WITH DIFFERENTIAL/PLATELET - Abnormal; Notable for the following components:   WBC 0.2 (*)    RBC 3.40 (*)    Hemoglobin 10.7 (*)    HCT 32.1 (*)    Platelets 10 (*)    Neutro Abs 0.0 (*)    Lymphs Abs 0.2 (*)    Monocytes Absolute 0.0 (*)    All other components within normal limits  LACTIC ACID, PLASMA - Abnormal; Notable for the following components:   Lactic Acid, Venous 3.1 (*)    All other components within normal limits  MAGNESIUM - Abnormal; Notable for the following components:   Magnesium 1.5 (*)    All other components within normal limits  CULTURE, BLOOD (ROUTINE X 2)  CULTURE, BLOOD (ROUTINE X 2)  URINE CULTURE  LIPASE, BLOOD  TROPONIN I  URINALYSIS, ROUTINE W REFLEX MICROSCOPIC  LACTIC ACID, PLASMA  COMPREHENSIVE METABOLIC PANEL  CBC  MAGNESIUM  PROCALCITONIN  PROCALCITONIN  TYPE AND SCREEN  PREPARE PLATELET PHERESIS    EKG None  Radiology Dg Chest Port 1 View  Result Date: 06/21/2018 CLINICAL DATA:  Fever and weakness EXAM: PORTABLE CHEST 1 VIEW  COMPARISON:  None. FINDINGS: The heart size and mediastinal contours are within normal limits. Both lungs  are clear. The visualized skeletal structures are unremarkable. There is a right chest wall power-injectable Port-A-Cath with tip in the lower SVC via a right internal jugular vein approach. IMPRESSION: No active disease. Electronically Signed   By: Ulyses Jarred M.D.   On: 06/21/2018 16:00    Procedures .Critical Care Performed by: Hayden Rasmussen, MD Authorized by: Hayden Rasmussen, MD   Critical care provider statement:    Critical care time (minutes):  45   Critical care time was exclusive of:  Separately billable procedures and treating other patients   Critical care was necessary to treat or prevent imminent or life-threatening deterioration of the following conditions:  Sepsis   Critical care was time spent personally by me on the following activities:  Discussions with consultants, evaluation of patient's response to treatment, examination of patient, ordering and performing treatments and interventions, ordering and review of laboratory studies, ordering and review of radiographic studies, pulse oximetry, re-evaluation of patient's condition, obtaining history from patient or surrogate, review of old charts and development of treatment plan with patient or surrogate   I assumed direction of critical care for this patient from another provider in my specialty: no     (including critical care time)  Medications Ordered in ED Medications  sodium chloride 0.9 % bolus 1,000 mL (has no administration in time range)  ondansetron (ZOFRAN) injection 4 mg (has no administration in time range)     Initial Impression / Assessment and Plan / ED Course  I have reviewed the triage vital signs and the nursing notes.  Pertinent labs & imaging results that were available during my care of the patient were reviewed by me and considered in my medical decision making (see chart for details).  Clinical Course as of Jun 20 1804  Wed Jun 21, 2018  1430 Patient has a temp of 1022 here.  Have  activated him as a code sepsis.  He is tachycardic and by EKG appears to be in rapid A. Fib.  Reviewed prior notes and no mention of A. fib in the past.  Satting 90%.  Getting IV fluids screening labs chest x-ray urinalysis.  Blood cultures.   [MB]  1527 Discussed with [name] who agrees with current management so far.  He said he had given 2 units of blood last week for anemia and he anticipates that he will be neutropenic.  He will evaluate the patient tomorrow   [MB]  1550 Patient CBC is back and his WBC is 0.2.  His platelets are also low at 10.   [MB]  6803 Patient currently states he is feeling a little bit better than when he got here initially.  His pressure is quite lower than when he arrived.  Fluids and antibiotics infusing.   [MB]  2122 EKG is A. fib with nonspecific ST-T changes, PVC, new from prior 2/19   [MB]  4825 Discussed with [facility] hospitalist who will evaluate patient in the emergency department for admission.   [MB]    Clinical Course User Index [MB] Hayden Rasmussen, MD        Final Clinical Impressions(s) / ED Diagnoses   Final diagnoses:  Neutropenic fever ([place])  Sepsis, due to unspecified organism, unspecified whether acute organ dysfunction present ([place])  Thrombocytopenia ([place])  New onset a-fib [facility])    ED Discharge Orders    None  Hayden Rasmussen, MD 06/21/18 1810

## 2018-06-22 ENCOUNTER — Inpatient Hospital Stay (HOSPITAL_COMMUNITY): Payer: PPO

## 2018-06-22 DIAGNOSIS — D61818 Other pancytopenia: Secondary | ICD-10-CM

## 2018-06-22 DIAGNOSIS — B9561 Methicillin susceptible Staphylococcus aureus infection as the cause of diseases classified elsewhere: Secondary | ICD-10-CM

## 2018-06-22 DIAGNOSIS — K1230 Oral mucositis (ulcerative), unspecified: Secondary | ICD-10-CM

## 2018-06-22 DIAGNOSIS — Z87891 Personal history of nicotine dependence: Secondary | ICD-10-CM

## 2018-06-22 DIAGNOSIS — C679 Malignant neoplasm of bladder, unspecified: Secondary | ICD-10-CM

## 2018-06-22 DIAGNOSIS — R7881 Bacteremia: Secondary | ICD-10-CM

## 2018-06-22 DIAGNOSIS — D709 Neutropenia, unspecified: Secondary | ICD-10-CM

## 2018-06-22 DIAGNOSIS — D696 Thrombocytopenia, unspecified: Secondary | ICD-10-CM

## 2018-06-22 DIAGNOSIS — K5900 Constipation, unspecified: Secondary | ICD-10-CM

## 2018-06-22 DIAGNOSIS — D649 Anemia, unspecified: Secondary | ICD-10-CM

## 2018-06-22 DIAGNOSIS — I482 Chronic atrial fibrillation, unspecified: Secondary | ICD-10-CM

## 2018-06-22 DIAGNOSIS — N179 Acute kidney failure, unspecified: Secondary | ICD-10-CM

## 2018-06-22 DIAGNOSIS — R5081 Fever presenting with conditions classified elsewhere: Secondary | ICD-10-CM

## 2018-06-22 DIAGNOSIS — C799 Secondary malignant neoplasm of unspecified site: Secondary | ICD-10-CM

## 2018-06-22 DIAGNOSIS — Z8551 Personal history of malignant neoplasm of bladder: Secondary | ICD-10-CM

## 2018-06-22 DIAGNOSIS — K1231 Oral mucositis (ulcerative) due to antineoplastic therapy: Secondary | ICD-10-CM

## 2018-06-22 DIAGNOSIS — N182 Chronic kidney disease, stage 2 (mild): Secondary | ICD-10-CM

## 2018-06-22 LAB — BLOOD CULTURE ID PANEL (REFLEXED)

## 2018-06-22 LAB — COMPREHENSIVE METABOLIC PANEL
ALT: 21 U/L (ref 0–44)
AST: 17 U/L (ref 15–41)
Albumin: 2.9 g/dL — ABNORMAL LOW (ref 3.5–5.0)
Alkaline Phosphatase: 42 U/L (ref 38–126)
Anion gap: 11 (ref 5–15)
BUN: 48 mg/dL — ABNORMAL HIGH (ref 8–23)
CO2: 23 mmol/L (ref 22–32)
Calcium: 8.1 mg/dL — ABNORMAL LOW (ref 8.9–10.3)
Chloride: 104 mmol/L (ref 98–111)
Creatinine, Ser: 1.56 mg/dL — ABNORMAL HIGH (ref 0.61–1.24)
GFR calc Af Amer: 53 mL/min — ABNORMAL LOW (ref >60)
GFR calc non Af Amer: 46 mL/min — ABNORMAL LOW (ref >60)
Glucose, Bld: 158 mg/dL — ABNORMAL HIGH (ref 70–99)
Potassium: 3.4 mmol/L — ABNORMAL LOW (ref 3.5–5.1)
Sodium: 138 mmol/L (ref 135–145)
Total Bilirubin: 1.4 mg/dL — ABNORMAL HIGH (ref 0.3–1.2)
Total Protein: 5.1 g/dL — ABNORMAL LOW (ref 6.5–8.1)

## 2018-06-22 LAB — PREPARE PLATELET PHERESIS: Unit division: 0

## 2018-06-22 LAB — MAGNESIUM: Magnesium: 1.5 mg/dL — ABNORMAL LOW (ref 1.7–2.4)

## 2018-06-22 LAB — PROCALCITONIN: Procalcitonin: 2.81 ng/mL

## 2018-06-22 LAB — ECHOCARDIOGRAM COMPLETE
Height: 74 in
Weight: 4240 oz

## 2018-06-22 LAB — BPAM PLATELET PHERESIS
Blood Product Expiration Date: 202003142359
ISSUE DATE / TIME: 202003112115
Unit Type and Rh: 6200

## 2018-06-22 LAB — LACTIC ACID, PLASMA: Lactic Acid, Venous: 2.7 mmol/L (ref 0.5–1.9)

## 2018-06-22 LAB — PREPARE RBC (CROSSMATCH)

## 2018-06-22 MED ORDER — PERFLUTREN LIPID MICROSPHERE
1.0000 mL | INTRAVENOUS | Status: AC | PRN
  Administered 2018-06-22: 2 mL via INTRAVENOUS
  Filled 2018-06-22: qty 10

## 2018-06-22 MED ORDER — AMLODIPINE BESYLATE 10 MG PO TABS
10.0000 mg | ORAL_TABLET | Freq: Every day | ORAL | Status: DC
  Administered 2018-06-22 – 2018-06-23 (×2): 10 mg via ORAL
  Filled 2018-06-22 (×2): qty 1

## 2018-06-22 MED ORDER — CEFAZOLIN SODIUM-DEXTROSE 2-4 GM/100ML-% IV SOLN
2.0000 g | Freq: Three times a day (TID) | INTRAVENOUS | Status: DC
  Administered 2018-06-22 – 2018-06-23 (×3): 2 g via INTRAVENOUS
  Filled 2018-06-22 (×3): qty 100

## 2018-06-22 MED ORDER — LIP MEDEX EX OINT
TOPICAL_OINTMENT | CUTANEOUS | Status: AC
  Administered 2018-06-22: 07:00:00
  Filled 2018-06-22: qty 7

## 2018-06-22 MED ORDER — SODIUM BICARBONATE/SODIUM CHLORIDE MOUTHWASH
OROMUCOSAL | Status: DC
  Administered 2018-06-22 (×3): via OROMUCOSAL
  Administered 2018-06-22: 1 via OROMUCOSAL
  Administered 2018-06-23: 06:00:00 via OROMUCOSAL
  Administered 2018-06-23: 1 via OROMUCOSAL
  Administered 2018-06-23 (×2): via OROMUCOSAL
  Administered 2018-06-23: 1 via OROMUCOSAL
  Administered 2018-06-24: 22:00:00 via OROMUCOSAL
  Administered 2018-06-24: 1 via OROMUCOSAL
  Administered 2018-06-24 (×2): via OROMUCOSAL
  Administered 2018-06-24: 1 via OROMUCOSAL
  Administered 2018-06-24 – 2018-06-29 (×16): via OROMUCOSAL
  Filled 2018-06-22 (×2): qty 1000

## 2018-06-22 MED ORDER — TBO-FILGRASTIM 480 MCG/0.8ML ~~LOC~~ SOSY
480.0000 ug | PREFILLED_SYRINGE | Freq: Every day | SUBCUTANEOUS | Status: DC
  Administered 2018-06-22 – 2018-06-25 (×4): 480 ug via SUBCUTANEOUS
  Filled 2018-06-22 (×4): qty 0.8

## 2018-06-22 MED ORDER — SODIUM CHLORIDE 0.9 % IV SOLN
2.0000 g | Freq: Three times a day (TID) | INTRAVENOUS | Status: DC
  Filled 2018-06-22 (×2): qty 2

## 2018-06-22 MED ORDER — SODIUM CHLORIDE 0.9% IV SOLUTION
Freq: Once | INTRAVENOUS | Status: AC
  Administered 2018-06-22: 09:00:00 via INTRAVENOUS

## 2018-06-22 MED ORDER — FUROSEMIDE 10 MG/ML IJ SOLN
20.0000 mg | Freq: Once | INTRAMUSCULAR | Status: AC
  Administered 2018-06-22: 20 mg via INTRAVENOUS
  Filled 2018-06-22: qty 2

## 2018-06-22 MED ORDER — SODIUM CHLORIDE 0.9% FLUSH
10.0000 mL | Freq: Two times a day (BID) | INTRAVENOUS | Status: DC
  Administered 2018-06-22 – 2018-06-24 (×6): 10 mL
  Administered 2018-06-25: 20 mL
  Administered 2018-06-25 – 2018-06-28 (×5): 10 mL

## 2018-06-22 MED ORDER — CHLORHEXIDINE GLUCONATE CLOTH 2 % EX PADS
6.0000 | MEDICATED_PAD | Freq: Every day | CUTANEOUS | Status: DC
  Administered 2018-06-22: 6 via TOPICAL

## 2018-06-22 MED ORDER — SODIUM CHLORIDE 0.9% FLUSH
10.0000 mL | INTRAVENOUS | Status: DC | PRN
  Administered 2018-06-27 – 2018-06-29 (×2): 10 mL
  Filled 2018-06-22 (×2): qty 40

## 2018-06-22 MED ORDER — MAGNESIUM SULFATE 2 GM/50ML IV SOLN
2.0000 g | Freq: Once | INTRAVENOUS | Status: AC
  Administered 2018-06-22: 2 g via INTRAVENOUS
  Filled 2018-06-22: qty 50

## 2018-06-22 MED ORDER — CHLORHEXIDINE GLUCONATE CLOTH 2 % EX PADS
6.0000 | MEDICATED_PAD | Freq: Every day | CUTANEOUS | Status: DC
  Administered 2018-06-23 – 2018-06-27 (×6): 6 via TOPICAL

## 2018-06-22 MED ORDER — GUAIFENESIN-DM 100-10 MG/5ML PO SYRP
5.0000 mL | ORAL_SOLUTION | ORAL | Status: DC | PRN
  Administered 2018-06-22 – 2018-06-25 (×7): 5 mL via ORAL
  Filled 2018-06-22 (×7): qty 10

## 2018-06-22 MED ORDER — METOPROLOL TARTRATE 5 MG/5ML IV SOLN
5.0000 mg | Freq: Four times a day (QID) | INTRAVENOUS | Status: DC | PRN
  Administered 2018-06-22 – 2018-06-23 (×2): 5 mg via INTRAVENOUS
  Filled 2018-06-22 (×4): qty 5

## 2018-06-22 NOTE — Progress Notes (Signed)
CRITICAL VALUE ALERT  Critical Value:  Lactic acid 2.7  Date & Time Notied:  1820  Provider Notified: Soledad Gerlach  Orders Received/Actions taken: continue to monitor.

## 2018-06-22 NOTE — Consult Note (Signed)
Referral MD  Reason for Referral: Neutropenic fever; atrial fibrillation; metastatic bladder cancer  Chief Complaint  Patient presents with  . Weakness  : I just got very weak yesterday.  HPI: Ms. [name] Rose is well-known to me.  Is a really nice 68 year old white male with metastatic bladder cancer.  He is been on multiple courses of chemotherapy.  Currently, he has been taking MVAC.  He has had 8 cycles.  His last chemotherapy was given on 06/13/2018.  He had anemia with a hemoglobin of 8 last week with treatment.  I adjusted his chemotherapy dose.  He got 2 units of blood on 06/14/2018.  He has been responding to treatment.  His last PET scan was stable to may be slightly improved.  Unfortunately, he developed rapid atrial fibrillation yesterday.  He got very weak.  He came to the emergency room.  His heart rate was about 150.  Again he was in atrial fibrillation.  He is neutropenic.  He had a temperature.  I think all this is clearly a sign that his body cannot tolerate MVAC chemotherapy any longer.  When he was admitted, his white cell count was 0.2.  Hemoglobin 10.7.  Platelet count 10,000.  He did have some hematuria.  He was given a platelet transfusion.  This morning, his white cell count is 0.1.  Hemoglobin 7.8.  Platelet count 28,000.  His electrolytes show a BUN of 48 creatinine 1.56.  Calcium 8.1.  Magnesium 1.5.  Potassium 3.4.  He had a chest ray done on admission.  The chest x-ray was unremarkable.  He is on IV antibiotics with Maxipime and vancomycin.  Cultures have been taken.  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  He has had some mucositis.  He has a hard time swallowing.  There is no diarrhea.  He has had no rashes.  Overall, I said performed status is ECOG 1.   Past Medical History:  Diagnosis Date  . Arthritis   . 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 ([place]) 09/29/2016  . Bladder tumor   . Goals of care, counseling/discussion 09/30/2016  .  History of prostate cancer followed by pcp dr [name]-  per pt last PSA undetectable   dx 2008-- (Stage T1c, Gleason 3+3,  PSA 4.58, vol 99cc)  s/p  radical prostatectomy (nerve sparing bilateral)   . Hypertension   . Lower urinary tract symptoms (LUTS)   . Pre-diabetes   . Wears glasses   :  Past Surgical History:  Procedure Laterality Date  . CATARACT EXTRACTION W/ INTRAOCULAR LENS  IMPLANT, BILATERAL Bilateral 2011  . [place]  . IR FLUORO GUIDE PORT INSERTION RIGHT  10/07/2016  . IR RADIOLOGIST EVAL & MGMT  01/05/2018  . IR US GUIDE VASC ACCESS RIGHT  10/07/2016  . KNEE ARTHROSCOPY Bilateral right 2006;  left 02-15-2007  . [place];  1990;  1983  . ROBOT ASSISTED LAPAROSCOPIC RADICAL PROSTATECTOMY  06/20/2006   bilateral nerve sparing  . TOTAL HIP ARTHROPLASTY Left 03/03/2015   Procedure: LEFT TOTAL HIP ARTHROPLASTY ANTERIOR APPROACH;  Surgeon: Dorna Leitz, MD;  Location: [place] [facility];  Service: Orthopedics;  Laterality: Left;  . TOTAL KNEE ARTHROPLASTY Bilateral left 08-13-2009;  right 12-26-2009  . TRANSURETHRAL RESECTION OF BLADDER TUMOR N/A 09/20/2016   Procedure: TRANSURETHRAL RESECTION OF BLADDER TUMOR (TURBT);  Surgeon: Franchot Gallo, MD;  Location: [facility];  Service: Urology;  Laterality: N/A;  :   Current Facility-Administered Medications:  .  0.9 %  sodium chloride infusion (Manually program via Guardrails IV Fluids), , Intravenous, Once, Georgette Shell, MD .  0.9 %  sodium chloride infusion (Manually program via Guardrails IV Fluids), , Intravenous, Once, Geovannie Vilar, Rudell Cobb, MD .  0.9 %  sodium chloride infusion, , Intravenous, Continuous, Georgette Shell, MD, Last Rate: 150 mL/hr at 06/21/18 1802 .  acetaminophen (TYLENOL) tablet 650 mg, 650 mg, Oral, Q6H PRN, Georgette Shell, MD, 650 mg at 06/21/18 2346 .  ceFEPIme (MAXIPIME) 2 g in sodium chloride 0.9 % 100 mL IVPB, 2 g, Intravenous, Q12H, Georgette Shell, MD .  furosemide (LASIX) injection 20 mg, 20 mg, Intravenous, Once, Jaxson Anglin, Rudell Cobb, MD .  furosemide (LASIX) injection 20 mg, 20 mg, Intravenous, Once, Carisma Troupe, Rudell Cobb, MD .  guaiFENesin-dextromethorphan (ROBITUSSIN DM) 100-10 MG/5ML syrup 5 mL, 5 mL, Oral, Q4H PRN, Georgette Shell, MD, 5 mL at 06/22/18 0028 .  lip balm (CARMEX) ointment, , , ,  .  magic mouthwash w/lidocaine, 5 mL, Oral, QID, Georgette Shell, MD, 5 mL at 06/21/18 2242 .  promethazine (PHENERGAN) injection 12.5 mg, 12.5 mg, Intravenous, Once, Schorr, Rhetta Mura, NP, Stopped at 06/21/18 2136 .  sodium bicarbonate/sodium chloride mouthwash 1050ml, , Mouth Rinse, Q4H, Chessa Barrasso, Rudell Cobb, MD .  Tbo-Filgrastim [facility]) injection 480 mcg, 480 mcg, Subcutaneous, q1800, Volanda Napoleon, MD .  vancomycin (VANCOCIN) 1,250 mg in sodium chloride 0.9 % 250 mL IVPB, 1,250 mg, Intravenous, Q24H, Georgette Shell, MD  Facility-Administered Medications Ordered in Other Encounters:  .  sodium chloride flush (NS) 0.9 % injection 10 mL, 10 mL, Intravenous, PRN, [place], Sarah M, NP, 10 mL at 02/21/17 1038:  . sodium chloride   Intravenous Once  . sodium chloride   Intravenous Once  . furosemide  20 mg Intravenous Once  . furosemide  20 mg Intravenous Once  . lip balm      . magic mouthwash w/lidocaine  5 mL Oral QID  . promethazine  12.5 mg Intravenous Once  . sodium bicarbonate/sodium chloride   Mouth Rinse Q4H  . Tbo-Filgrastim  480 mcg Subcutaneous q1800  :  No Known Allergies:  Family History  Problem Relation Age of Onset  . Hypertension Mother   . Aneurysm Mother   . Emphysema Father   . Hypertension Father   :  Social History   Socioeconomic History  . Marital status: Married    Spouse name: Not on file  . Number of children: Not on file  . Years of education: Not on file  . Highest education level: Not on file  Occupational History  . Not on file  Social Needs  . Financial resource  strain: Not on file  . Food insecurity:    Worry: Not on file    Inability: Not on file  . Transportation needs:    Medical: Not on file    Non-medical: Not on file  Tobacco Use  . Smoking status: Former Smoker    Years: 16.00    Types: Cigarettes    Last attempt to quit: 09/25/1984    Years since quitting: 33.7  . Smokeless tobacco: Never Used  Substance and Sexual Activity  . Alcohol use: Yes    Comment: occasionally  . Drug use: No  . Sexual activity: Not on file  Lifestyle  . Physical activity:    Days per week: Not on file    Minutes per session: Not on file  . Stress: Not on  file  Relationships  . Social connections:    Talks on phone: Not on file    Gets together: Not on file    Attends religious service: Not on file    Active member of club or organization: Not on file    Attends meetings of clubs or organizations: Not on file    Relationship status: Not on file  . Intimate partner violence:    Fear of current or ex partner: Not on file    Emotionally abused: Not on file    Physically abused: Not on file    Forced sexual activity: Not on file  Other Topics Concern  . Not on file  Social History Narrative  . Not on file  :  Review of Systems  Constitutional: Positive for fever and malaise/fatigue.  HENT: Positive for sore throat.   Respiratory: Positive for shortness of breath.   Cardiovascular: Positive for palpitations.  Gastrointestinal: Positive for heartburn and nausea.  Genitourinary: Positive for urgency.  Musculoskeletal: Positive for back pain.  Skin: Negative.   Neurological: Negative.   Endo/Heme/Allergies: Negative.   Psychiatric/Behavioral: Negative.      Exam: Well-developed and well-nourished white gentleman in no obvious distress.  Vital signs show a temperature of 101.7.  Pulse 100.  Blood pressure 146/71.  Head neck exam shows no ocular or oral lesions.  He has some erythema in the pharynx.  There is no obvious adenopathy in the neck.   Lungs sound clear bilaterally.  Cardiac exam tachycardic but regular.  He has an occasional extra beat.  Abdomen is soft.  Bowel sounds are present.  Bowel sounds might be slightly decreased.  There is no guarding or rebound tenderness.  Extremities shows no clubbing, cyanosis or edema.  Skin exam shows no rashes, ecchymoses or petechia.  Neurological exam is non-focal.  Patient Vitals for the past 24 hrs:  BP Temp Temp src Pulse Resp SpO2 Height Weight  06/22/18 0500 (!) 146/71 - - 100 (!) 23 (!) 88 % - -  06/22/18 0400 (!) 156/74 98.5 F (36.9 C) Oral (!) 105 18 92 % - -  06/22/18 0300 (!) 143/77 - - (!) 101 20 92 % - -  06/22/18 0200 (!) 158/79 - - (!) 106 18 91 % - -  06/22/18 0100 (!) 145/65 - - (!) 110 (!) 23 92 % - -  06/22/18 0027 (!) 169/87 99 F (37.2 C) Oral (!) 112 (!) 24 94 % - -  06/22/18 0000 (!) 149/94 - - (!) 140 (!) 23 93 % - -  06/21/18 2315 - (!) 101.7 F (38.7 C) Oral - - - - -  06/21/18 2300 (!) 159/85 - - (!) 127 (!) 23 92 % - -  06/21/18 2200 (!) 146/87 - - (!) 147 (!) 23 98 % - -  06/21/18 2145 (!) 152/92 98.9 F (37.2 C) Oral (!) 136 (!) 32 90 % - -  06/21/18 2100 121/70 99.2 F (37.3 C) Oral (!) 131 (!) 25 95 % - -  06/21/18 1930 (!) 118/98 - - (!) 130 (!) 24 94 % - -  06/21/18 1900 113/63 - - (!) 144 (!) 35 97 % - -  06/21/18 1830 117/85 - - (!) 132 19 96 % - -  06/21/18 1800 95/67 - - (!) 137 (!) 27 97 % - -  06/21/18 1738 130/80 - - (!) 142 (!) 22 100 % - -  06/21/18 1705 120/73 - - (!) 109 18 95 % - -  06/21/18 1700 120/73 - - (!) 116 (!) 22 - - -  06/21/18 1630 93/67 - - (!) 138 18 97 % - -  06/21/18 1600 93/60 - - (!) 130 20 97 % - -  06/21/18 1545 - - - 77 12 99 % - -  06/21/18 1530 96/76 - - (!) 132 (!) 24 98 % - -  06/21/18 1519 99/68 - - (!) 140 17 100 % - -  06/21/18 1418 - - - - - - $R'6\' 2"'Pt$  (1.88 m) 265 lb (120.2 kg)  06/21/18 1415 (!) 150/109 (!) 102.2 F (39 C) Oral (!) 141 (!) 22 100 % - -  06/21/18 1400 - - - - - 98 % - -     Recent  Labs    06/21/18 1438 06/22/18 0226  WBC 0.2* 0.1*  HGB 10.7* 7.8*  HCT 32.1* 24.7*  PLT 10* 28*   Recent Labs    06/21/18 1438 06/22/18 0226  NA 136 138  K 4.1 3.4*  CL 97* 104  CO2 26 23  GLUCOSE 213* 158*  BUN 58* 48*  CREATININE 1.69* 1.56*  CALCIUM 9.0 8.1*    Blood smear review: None  Pathology: None    Assessment and Plan: [name] Rose is a very nice 68 year old white male.  He has metastatic bladder cancer.  He actually is done incredibly well with this.  He has been on treatment for over a year.  Again, I think that his admission is a clear sign that the MVAC protocol is no longer tolerated.  He had anemia with his last cycle of chemotherapy.  Now, he has anemia, neutropenia, thrombocytopenia and the rapid A. fib.  By his heart monitor, looks like he is back in normal sinus rhythm.  He probably has sinus tachycardia with an occasional extra beat.  I think a lot of this is reflective of his neutropenic status.  I will start him on Neupogen.  Hopefully, we can get his blood count up more quickly.  We will see what his cultures show.  He is on Maxipime and vancomycin which is good broad coverage.  I am not sure why his Port-A-Cath is not being used.  This really will make life a lot easier for him.  I talked to his nurse to have the Port-A-Cath accessed.  I spent about 45 minutes with he and his wife.  I hate that he is admitted.  I know that he will get fantastic care from everybody down in the ICU.  We will follow along.  Aggressive intervention needs to be undertaken to get him through this.  Again he is done incredibly well.  He does not have a lot of disease so we definitely are able to help him.  I appreciate everybody's care in the ICU.  [name], MD  Oswaldo Milian 41:13

## 2018-06-22 NOTE — Progress Notes (Signed)
  Echocardiogram 2D Echocardiogram has been performed.  Bobbye Charleston 06/22/2018, 3:35 PM

## 2018-06-22 NOTE — Progress Notes (Signed)
PROGRESS NOTE    [name] Rose  KNL:[id number] DOB: Sep 04, 1950 DOA: 06/21/2018 PCP: Shirline Frees, MD   Brief Narrative 68 y.o. male with medical history significant of stage IV bladder cancer going through chemotherapy his last chemotherapy was 224 2028 days prior to admission to the hospital.  Patient reports usually the second week after the chemotherapy he feels very weak but this is the first week after chemotherapy he has no appetite very decreased p.o. intake with lightheadedness and dizziness upon standing.  He is also not able to eat due to ulcers in his mouth.  Low-grade temp at home.  He does complain of nausea but no vomiting diarrhea or constipation.  Denies any cough shortness of breath.  He reported hematuria at home has not urinated since coming to the ER.    ED Course: Patient received cefepime sodium 136 potassium 4.1 BUN 58 creatinine 1.69 anion gap is 13 magnesium 1.5 lipase 27 total bilirubin 1.8 lactic acid level was 3.1 white count was 0.2 hemoglobin 10.7 platelet count 10.  EKG in the ER rapid A. fib   Assessment & Plan:   Active Problems:   Neutropenic fever (HCC)    #1 neutropenic fever/sepsis in in the setting of f stage IV bladder cancer status post recent chemo.  Patient presented with hypotension tachycardia and fever and severe neutropenia and thrombocytopenia.  Blood culture with MSSA bacteremia.  DC vancomycin continue cefepime.  Will order echocardiogram to rule out any valvular disease.  #2 pancytopenia secondary to recent chemo platelet count 10,000 on admit it is up to 28 today.  He received most of 1 unit of platelets.  Hemoglobin down to 7.8 today blood transfusion has been already ordered by [name].  Patient also received a dose of Granix.  #3 mucositis we will treat with Magic mouthwash.  #4 hypotension patient's blood pressure is trending up.  Will restart home antihypertensives.   #5 new onset atrial fibrillation transient has  resolved.    #6 AKI secondary to dehydration continue IV fluids with IV fluids.  #7 hypomagnesemia replete  DVT prophylaxis: SCD Code Status: Full code Family Communication: Discussed with wife in the room Disposition Plan: Pending clinical improvement Consults called: ED physician spoke with [name]  Estimated body mass index is 34.02 kg/m as calculated from the following:   Height as of this encounter: $RemoveBeforeD'6\' 2"'bnnrLghypTWmcV$  (1.88 m).   Weight as of this encounter: 120.2 kg.    Subjective: Patient resting in bed wife by the bedside.  He is trying to order breakfast.  He feels his mouth is a little better with the Magic mouthwash.  He had low-grade temp during transfusion of platelets he denies any nausea vomiting diarrhea cough. Objective: Vitals:   06/22/18 0904 06/22/18 1102 06/22/18 1126 06/22/18 1157  BP: (!) 158/76 (!) 160/91 (!) 169/88 (!) 168/99  Pulse: (!) 110 (!) 107 (!) 111 (!) 114  Resp: (!) 31 (!) 30 (!) 22 (!) 26  Temp: 99.4 F (37.4 C) 99.4 F (37.4 C) (!) 100.4 F (38 C) 98.8 F (37.1 C)  TempSrc: Oral Oral Oral Oral  SpO2: 90% 94% 95% 93%  Weight:      Height:        Intake/Output Summary (Last 24 hours) at 06/22/2018 1243 Last data filed at 06/22/2018 1213 Gross per 24 hour  Intake 2410 ml  Output 1500 ml  Net 910 ml   Filed Weights   06/21/18 1418  Weight: 120.2 kg  Examination: Oral mucosa erythema noted General exam: Appears calm and comfortable  Respiratory system: Clear to auscultation. Respiratory effort normal. Cardiovascular system: S1 & S2 heard, RRR. No JVD, murmurs, rubs, gallops or clicks. No pedal edema. Gastrointestinal system: Abdomen is nondistended, soft and nontender. No organomegaly or masses felt. Normal bowel sounds heard. Central nervous system: Alert and oriented. No focal neurological deficits. Extremities: Symmetric 5 x 5 power. Skin: No rashes, lesions or ulcers Psychiatry: Judgement and insight appear normal. Mood & affect  appropriate.     Data Reviewed: I have personally reviewed following labs and imaging studies  CBC: Recent Labs  Lab 06/21/18 1438 06/22/18 0226  WBC 0.2* 0.1*  NEUTROABS 0.0*  --   HGB 10.7* 7.8*  HCT 32.1* 24.7*  MCV 94.4 98.0  PLT 10* 28*   Basic Metabolic Panel: Recent Labs  Lab 06/21/18 1438 06/22/18 0226  NA 136 138  K 4.1 3.4*  CL 97* 104  CO2 26 23  GLUCOSE 213* 158*  BUN 58* 48*  CREATININE 1.69* 1.56*  CALCIUM 9.0 8.1*  MG 1.5* 1.5*   GFR: Estimated Creatinine Clearance: 64.2 mL/min (A) (by C-G formula based on SCr of 1.56 mg/dL (H)). Liver Function Tests: Recent Labs  Lab 06/21/18 1438 06/22/18 0226  AST 19 17  ALT 23 21  ALKPHOS 51 42  BILITOT 1.8* 1.4*  PROT 6.2* 5.1*  ALBUMIN 3.6 2.9*   Recent Labs  Lab 06/21/18 1438  LIPASE 27   No results for input(s): AMMONIA in the last 168 hours. Coagulation Profile: No results for input(s): INR, PROTIME in the last 168 hours. Cardiac Enzymes: Recent Labs  Lab 06/21/18 1438  TROPONINI <0.03   BNP (last 3 results) No results for input(s): PROBNP in the last 8760 hours. HbA1C: No results for input(s): HGBA1C in the last 72 hours. CBG: No results for input(s): GLUCAP in the last 168 hours. Lipid Profile: No results for input(s): CHOL, HDL, LDLCALC, TRIG, CHOLHDL, LDLDIRECT in the last 72 hours. Thyroid Function Tests: No results for input(s): TSH, T4TOTAL, FREET4, T3FREE, THYROIDAB in the last 72 hours. Anemia Panel: No results for input(s): VITAMINB12, FOLATE, FERRITIN, TIBC, IRON, RETICCTPCT in the last 72 hours. Sepsis Labs: Recent Labs  Lab 06/21/18 1435 06/21/18 1717 06/21/18 1732 06/22/18 0226  PROCALCITON  --   --  1.08 2.81  LATICACIDVEN 3.1* 2.5*  --   --     Recent Results (from the past 240 hour(s))  Culture, blood (routine x 2)     Status: None (Preliminary result)   Collection Time: 06/21/18  2:13 PM  Result Value Ref Range Status   Specimen Description   Final     BLOOD RIGHT ANTECUBITAL Performed at [facility], [place] [address], [place], [place] [address]    Special Requests   Final    BOTTLES DRAWN AEROBIC AND ANAEROBIC Blood Culture results may not be optimal due to an excessive volume of blood received in culture bottles Performed at [place] [address] [place], [place] [address]    Culture  Setup Time   Final    GRAM POSITIVE COCCI Organism ID to follow CRITICAL RESULT CALLED TO, READ BACK BY AND VERIFIED WITH: PHRMD J GADHIA $Remove'[email or web address]$  06/22/2018 BY S GEZAHEGN Performed at [facility], [place] [address] [place], [place] [address]    Culture [facility] POSITIVE COCCI  Final   Report Status PENDING  Incomplete  Blood Culture ID Panel (Reflexed)  Status: Abnormal   Collection Time: 06/21/18  2:13 PM  Result Value Ref Range Status   Enterococcus species NOT DETECTED NOT DETECTED Final   Listeria monocytogenes NOT DETECTED NOT DETECTED Final   Staphylococcus species DETECTED (A) NOT DETECTED Final    Comment: CRITICAL RESULT CALLED TO, READ BACK BY AND VERIFIED WITH: PHRMD J GADHIA $Remove'[email or web address]$  06/22/2018 BY S GEZAHEGN    Staphylococcus aureus (BCID) DETECTED (A) NOT DETECTED Final    Comment: Methicillin (oxacillin) susceptible Staphylococcus aureus (MSSA). Preferred therapy is anti staphylococcal beta lactam antibiotic (Cefazolin or Nafcillin), unless clinically contraindicated. CRITICAL RESULT CALLED TO, READ BACK BY AND VERIFIED WITH: PHRMD J GADHIA $Remove'[email or web address]$  06/22/2018 BY S GEZAHEGN    Methicillin resistance NOT DETECTED NOT DETECTED Final   Streptococcus species NOT DETECTED NOT DETECTED Final   Streptococcus agalactiae NOT DETECTED NOT DETECTED Final   Streptococcus pneumoniae NOT DETECTED NOT DETECTED Final   Streptococcus pyogenes NOT DETECTED NOT DETECTED Final   Acinetobacter baumannii NOT DETECTED NOT DETECTED Final   Enterobacteriaceae species NOT DETECTED NOT DETECTED Final   Enterobacter  cloacae complex NOT DETECTED NOT DETECTED Final   Escherichia coli NOT DETECTED NOT DETECTED Final   Klebsiella oxytoca NOT DETECTED NOT DETECTED Final   Klebsiella pneumoniae NOT DETECTED NOT DETECTED Final   Proteus species NOT DETECTED NOT DETECTED Final   Serratia marcescens NOT DETECTED NOT DETECTED Final   Haemophilus influenzae NOT DETECTED NOT DETECTED Final   Neisseria meningitidis NOT DETECTED NOT DETECTED Final   Pseudomonas aeruginosa NOT DETECTED NOT DETECTED Final   Candida albicans NOT DETECTED NOT DETECTED Final   Candida glabrata NOT DETECTED NOT DETECTED Final   Candida krusei NOT DETECTED NOT DETECTED Final   Candida parapsilosis NOT DETECTED NOT DETECTED Final   Candida tropicalis NOT DETECTED NOT DETECTED Final    Comment: Performed at [facility], [place]. [address], [place], [place] [address]  Culture, blood (routine x 2)     Status: None (Preliminary result)   Collection Time: 06/21/18  3:22 PM  Result Value Ref Range Status   Specimen Description   Final    BLOOD RIGHT FOREARM Performed at [place] [address], [place], [place] [address]    Special Requests   Final    BOTTLES DRAWN AEROBIC AND ANAEROBIC Blood Culture results may not be optimal due to an excessive volume of blood received in culture bottles Performed at [facility] [address], [place], [place] [address]    Culture   Final    NO GROWTH < 24 HOURS Performed at [facility] [address], [place], [place] [address]    Report Status PENDING  Incomplete  MRSA PCR Screening     Status: None   Collection Time: 06/21/18  8:48 PM  Result Value Ref Range Status   MRSA by PCR NEGATIVE NEGATIVE Final    Comment:        The GeneXpert MRSA Assay (FDA approved for NASAL specimens only), is one component of a comprehensive MRSA colonization surveillance program. It is not intended to diagnose MRSA infection nor to guide or monitor  treatment for MRSA infections. Performed at [facility], [place] [address], [place], [place] [address]          Radiology Studies: Dg Chest Port 1 View  Result Date: 06/21/2018 CLINICAL DATA:  Fever and weakness EXAM: PORTABLE CHEST 1 VIEW COMPARISON:  None. FINDINGS: The heart size and mediastinal contours are  within normal limits. Both lungs are clear. The visualized skeletal structures are unremarkable. There is a right chest wall power-injectable Port-A-Cath with tip in the lower SVC via a right internal jugular vein approach. IMPRESSION: No active disease. Electronically Signed   By: Ulyses Jarred M.D.   On: 06/21/2018 16:00        Scheduled Meds: . sodium chloride   Intravenous Once  . Chlorhexidine Gluconate Cloth  6 each Topical Daily  . furosemide  20 mg Intravenous Once  . magic mouthwash w/lidocaine  5 mL Oral QID  . promethazine  12.5 mg Intravenous Once  . sodium bicarbonate/sodium chloride   Mouth Rinse Q4H  . sodium chloride flush  10-40 mL Intracatheter Q12H  . Tbo-Filgrastim  480 mcg Subcutaneous q1800   Continuous Infusions: . sodium chloride 150 mL/hr at 06/22/18 1131  . ceFEPime (MAXIPIME) IV Stopped (06/22/18 0500)  . vancomycin       LOS: 1 day     Georgette Shell, MD [facility]  If 7PM-7AM, please contact night-coverage [email or web address] Password [facility] 06/22/2018, 12:43 PM

## 2018-06-22 NOTE — Consult Note (Signed)
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 Change anbx to ancef 2. Check TEE when able 3. Check u/s of his port 4. Watch ANC 5. Factor support 6. Transfusion today  Thank you so much for this interesting consult,  Active Problems:   Neutropenic fever ([place])   Mucositis due to chemotherapy   AKI (acute kidney injury) ([place])   Pancytopenia ([place])   . sodium chloride   Intravenous Once  . amLODipine  10 mg Oral Daily  . Chlorhexidine Gluconate Cloth  6 each Topical Daily  . furosemide  20 mg Intravenous Once  . magic mouthwash w/lidocaine  5 mL Oral QID  . promethazine  12.5 mg Intravenous Once  . sodium bicarbonate/sodium chloride   Mouth Rinse Q4H  . sodium chloride flush  10-40 mL Intracatheter Q12H  . Tbo-Filgrastim  480 mcg Subcutaneous q1800    HPI: [name] Rose is a 68 y.o. male with hx of bladder CA (last CTX 3-3 MVAC ), adm on 3-11 with mouth pain, ulcers, and anorexia ~ 1 week after getting chemo. In EMS he was noted to have fever (he can't remember #). He had no problems with his port. He noted anuria as well.  He was found to have WBC 0.2 (ANC 0) and PLT 10. Cr 1.69.  He was started on cefepime/vanco.   Review of Systems: Review of Systems  Constitutional: Positive for fever. Negative for chills.  HENT: Positive for congestion.   Respiratory: Positive for cough.   Gastrointestinal: Positive for constipation. Negative for abdominal pain and diarrhea.  Genitourinary:       Decreased u/o  Please see HPI. All other systems reviewed and negative.   Past Medical History:  Diagnosis Date  . Arthritis   . 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 ([place]) 09/29/2016  . Bladder tumor   .  Goals of care, counseling/discussion 09/30/2016  . History of prostate cancer followed by pcp dr [name]-  per pt last PSA undetectable   dx 2008-- (Stage T1c, Gleason 3+3,  PSA 4.58, vol 99cc)  s/p  radical prostatectomy (nerve sparing bilateral)   . Hypertension   . Lower urinary tract symptoms (LUTS)   . Pre-diabetes   . Wears glasses     Social History   Tobacco Use  . Smoking status: Former Smoker    Years: 16.00    Types: Cigarettes    Last attempt to quit: 09/25/1984    Years since quitting: 33.7  . Smokeless tobacco: Never Used  Substance Use Topics  . Alcohol use: Yes    Comment: occasionally  . Drug use: No    Family History  Problem Relation Age of Onset  . Hypertension Mother   . Aneurysm Mother   . Emphysema Father   . Hypertension Father      Medications:  Scheduled: . sodium chloride   Intravenous Once  . amLODipine  10 mg Oral Daily  . Chlorhexidine Gluconate Cloth  6 each Topical Daily  . furosemide  20 mg Intravenous Once  . magic mouthwash w/lidocaine  5 mL Oral QID  .  promethazine  12.5 mg Intravenous Once  . sodium bicarbonate/sodium chloride   Mouth Rinse Q4H  . sodium chloride flush  10-40 mL Intracatheter Q12H  . Tbo-Filgrastim  480 mcg Subcutaneous q1800    Abtx:  Anti-infectives (From admission, onward)   Start     Dose/Rate Route Frequency Ordered Stop   06/22/18 1800  vancomycin (VANCOCIN) 1,250 mg in sodium chloride 0.9 % 250 mL IVPB  Status:  Discontinued     1,250 mg 166.7 mL/hr over 90 Minutes Intravenous Every 24 hours 06/21/18 1740 06/22/18 1245   06/22/18 0400  ceFEPIme (MAXIPIME) 2 g in sodium chloride 0.9 % 100 mL IVPB     2 g 200 mL/hr over 30 Minutes Intravenous Every 12 hours 06/21/18 1740     06/21/18 1745  vancomycin (VANCOCIN) 2,000 mg in sodium chloride 0.9 % 500 mL IVPB     2,000 mg 250 mL/hr over 120 Minutes Intravenous  Once 06/21/18 1737 06/21/18 2054   06/21/18 1515  ceFEPIme (MAXIPIME) 2 g in sodium chloride 0.9  % 100 mL IVPB     2 g 200 mL/hr over 30 Minutes Intravenous  Once 06/21/18 1500 06/21/18 1631        OBJECTIVE: Blood pressure (!) 148/82, pulse (!) 108, temperature 98.8 F (37.1 C), temperature source Oral, resp. rate (!) 24, height $RemoveBe'6\' 2"'lLToZnNTd$  (1.88 m), weight 120.2 kg, SpO2 94 %.  Physical Exam Constitutional:      General: He is not in acute distress.    Appearance: He is ill-appearing.  HENT:     Mouth/Throat:     Mouth: Mucous membranes are moist.     Pharynx: Posterior oropharyngeal erythema present.  Eyes:     Extraocular Movements: Extraocular movements intact.     Pupils: Pupils are equal, round, and reactive to light.  Neck:     Musculoskeletal: Normal range of motion and neck supple.  Cardiovascular:     Rate and Rhythm: Tachycardia present.  Chest:    Abdominal:     General: Bowel sounds are normal. There is no distension.     Palpations: Abdomen is soft. There is no mass.  Musculoskeletal:     Right lower leg: No edema.     Left lower leg: No edema.  Neurological:     General: No focal deficit present.     Mental Status: He is alert.     Lab Results Results for orders placed or performed during the hospital encounter of 06/21/18 (from the past 48 hour(s))  Culture, blood (routine x 2)     Status: None (Preliminary result)   Collection Time: 06/21/18  2:13 PM  Result Value Ref Range   Specimen Description      BLOOD RIGHT ANTECUBITAL Performed at [facility] [address] [place], [place] [address]    Special Requests      BOTTLES DRAWN AEROBIC AND ANAEROBIC Blood Culture results may not be optimal due to an excessive volume of blood received in culture bottles Performed at [facility] [address] [place], [place] [address]    Culture  Setup Time      GRAM POSITIVE COCCI Organism ID to follow CRITICAL RESULT CALLED TO, READ BACK BY AND VERIFIED WITH: PHRMD J GADHIA $Remove'[id number]'[email or web address]$  06/22/2018 BY S GEZAHEGN  Performed at [facility], [place] [address], [place],  [address]    Culture GRAM POSITIVE COCCI    Report Status PENDING   Urinalysis, Routine w reflex microscopic  Status: Abnormal   Collection Time: 06/21/18  2:13 PM  Result Value Ref Range   Color, Urine YELLOW YELLOW   APPearance CLEAR CLEAR   Specific Gravity, Urine 1.015 1.005 - 1.030   pH 5.0 5.0 - 8.0   Glucose, UA 150 (A) NEGATIVE mg/dL   Hgb urine dipstick MODERATE (A) NEGATIVE   Bilirubin Urine NEGATIVE NEGATIVE   Ketones, ur NEGATIVE NEGATIVE mg/dL   Protein, ur NEGATIVE NEGATIVE mg/dL   Nitrite NEGATIVE NEGATIVE   Leukocytes,Ua NEGATIVE NEGATIVE   RBC / HPF >50 (H) 0 - 5 RBC/hpf   WBC, UA 0-5 0 - 5 WBC/hpf   Bacteria, UA NONE SEEN NONE SEEN   Mucus PRESENT     Comment: Performed at [facility], [place] [address] [place], [place] [address]  Blood Culture ID Panel (Reflexed)     Status: Abnormal   Collection Time: 06/21/18  2:13 PM  Result Value Ref Range   Enterococcus species NOT DETECTED NOT DETECTED   Listeria monocytogenes NOT DETECTED NOT DETECTED   Staphylococcus species DETECTED (A) NOT DETECTED    Comment: CRITICAL RESULT CALLED TO, READ BACK BY AND VERIFIED WITH: PHRMD J GADHIA $Remove'@[id number]$  06/22/2018 BY S GEZAHEGN    Staphylococcus aureus (BCID) DETECTED (A) NOT DETECTED    Comment: Methicillin (oxacillin) susceptible Staphylococcus aureus (MSSA). Preferred therapy is anti staphylococcal beta lactam antibiotic (Cefazolin or Nafcillin), unless clinically contraindicated. CRITICAL RESULT CALLED TO, READ BACK BY AND VERIFIED WITH: PHRMD J GADHIA $Remove'@0846'[id number]$  06/22/2018 BY S GEZAHEGN    Methicillin resistance NOT DETECTED NOT DETECTED   Streptococcus species NOT DETECTED NOT DETECTED   Streptococcus agalactiae NOT DETECTED NOT DETECTED   Streptococcus pneumoniae NOT DETECTED NOT DETECTED   Streptococcus pyogenes NOT DETECTED NOT DETECTED   Acinetobacter baumannii NOT DETECTED NOT DETECTED    Enterobacteriaceae species NOT DETECTED NOT DETECTED   Enterobacter cloacae complex NOT DETECTED NOT DETECTED   Escherichia coli NOT DETECTED NOT DETECTED   Klebsiella oxytoca NOT DETECTED NOT DETECTED   Klebsiella pneumoniae NOT DETECTED NOT DETECTED   Proteus species NOT DETECTED NOT DETECTED   Serratia marcescens NOT DETECTED NOT DETECTED   Haemophilus influenzae NOT DETECTED NOT DETECTED   Neisseria meningitidis NOT DETECTED NOT DETECTED   Pseudomonas aeruginosa NOT DETECTED NOT DETECTED   Candida albicans NOT DETECTED NOT DETECTED   Candida glabrata NOT DETECTED NOT DETECTED   Candida krusei NOT DETECTED NOT DETECTED   Candida parapsilosis NOT DETECTED NOT DETECTED   Candida tropicalis NOT DETECTED NOT DETECTED    Comment: Performed at [facility] [address], [place], [place] [address]  Lactic acid, plasma     Status: Abnormal   Collection Time: 06/21/18  2:35 PM  Result Value Ref Range   Lactic Acid, Venous 3.1 (HH) 0.5 - 1.9 mmol/L    Comment: CRITICAL RESULT CALLED TO, READ BACK BY AND VERIFIED WITH: Ouida Sills [id number] @ 1448 BY J SCOTTON Performed at [facility] [address], [place], [place] [address]   Comprehensive metabolic panel     Status: Abnormal   Collection Time: 06/21/18  2:38 PM  Result Value Ref Range   Sodium 136 135 - 145 mmol/L   Potassium 4.1 3.5 - 5.1 mmol/L   Chloride 97 (L) 98 - 111 mmol/L   CO2 26 22 - 32 mmol/L   Glucose, Bld 213 (H) 70 - 99 mg/dL   BUN 58 (H) 8 - 23 mg/dL   Creatinine, Ser 1.69 (H)  0.61 - 1.24 mg/dL   Calcium 9.0 8.9 - 67.6 mg/dL   Total Protein 6.2 (L) 6.5 - 8.1 g/dL   Albumin 3.6 3.5 - 5.0 g/dL   AST 19 15 - 41 U/L   ALT 23 0 - 44 U/L   Alkaline Phosphatase 51 38 - 126 U/L   Total Bilirubin 1.8 (H) 0.3 - 1.2 mg/dL   GFR calc non Af Amer 41 (L) >60 mL/min   GFR calc Af Amer 48 (L) >60 mL/min   Anion gap 13 5 - 15    Comment: Performed at [facility], [address] [place], [place] [address]  Lipase, blood     Status: None   Collection Time: 06/21/18  2:38 PM  Result Value Ref Range   Lipase 27 11 - 51 U/L    Comment: Performed at [facility], [address], [place], [place] [address]  Troponin I - Once     Status: None   Collection Time: 06/21/18  2:38 PM  Result Value Ref Range   Troponin I <0.03 <0.03 ng/mL    Comment: Performed at [facility], [address] [place], [place] [address]  CBC with Differential     Status: Abnormal   Collection Time: 06/21/18  2:38 PM  Result Value Ref Range   WBC 0.2 (LL) 4.0 - 10.5 K/uL    Comment: REPEATED TO VERIFY WHITE COUNT CONFIRMED ON SMEAR THIS CRITICAL RESULT HAS VERIFIED AND BEEN CALLED TO B.JESSEE BY NATHAN THOMPSON ON 03 11 2020 AT 1548, AND HAS BEEN READ BACK. CRITICAL RESULT VERIFIED    RBC 3.40 (L) 4.22 - 5.81 MIL/uL   Hemoglobin 10.7 (L) 13.0 - 17.0 g/dL   HCT 94.7 (L) 65.4 - 65.0 %   MCV 94.4 80.0 - 100.0 fL   MCH 31.5 26.0 - 34.0 pg   MCHC 33.3 30.0 - 36.0 g/dL   RDW 35.4 65.6 - 81.2 %   Platelets 10 (LL) 150 - 400 K/uL    Comment: REPEATED TO VERIFY PLATELET COUNT CONFIRMED BY SMEAR SPECIMEN CHECKED FOR CLOTS Immature Platelet Fraction may be clinically indicated, consider ordering this additional test [id number]    nRBC 0.0 0.0 - 0.2 %   Neutrophils Relative % 10 %   Neutro Abs 0.0 (L) 1.7 - 7.7 K/uL   Lymphocytes Relative 85 %   Lymphs Abs 0.2 (L) 0.7 - 4.0 K/uL   Monocytes Relative 5 %   Monocytes Absolute 0.0 (L) 0.1 - 1.0 K/uL   Eosinophils Relative 0 %   Eosinophils Absolute 0.0 0.0 - 0.5 K/uL   Basophils Relative 0 %   Basophils Absolute 0.0 0.0 - 0.1 K/uL   Immature Granulocytes 0 %   Abs Immature Granulocytes 0.00 0.00 - 0.07 K/uL   Tear Drop Cells PRESENT    Ovalocytes PRESENT     Comment: Performed at [facility], [address], [place], [place] [address]  Magnesium     Status: Abnormal    Collection Time: 06/21/18  2:38 PM  Result Value Ref Range   Magnesium 1.5 (L) 1.7 - 2.4 mg/dL    Comment: Performed at [facility], [address], [place], [place] [address]  Culture, blood (routine x 2)     Status: None (Preliminary result)   Collection Time: 06/21/18  3:22 PM  Result Value Ref Range   Specimen Description      BLOOD RIGHT FOREARM Performed at [facility]  [facility], [place] [address] [place], [place] [address]    Special Requests      BOTTLES DRAWN AEROBIC AND ANAEROBIC Blood Culture results may not be optimal due to an excessive volume of blood received in culture bottles Performed at [facility] [address], [place], [place] [address]    Culture      NO GROWTH < 24 HOURS Performed at [facility] [address], [place], [place] [address]    Report Status PENDING   Type and screen [place]     Status: None (Preliminary result)   Collection Time: 06/21/18  3:58 PM  Result Value Ref Range   ABO/RH(D) A POS    Antibody Screen NEG    Sample Expiration 06/24/2018    Unit Number [id number]    Blood Component Type RBC LR PHER2    Unit division 00    Status of Unit ISSUED    Transfusion Status OK TO TRANSFUSE    Crossmatch Result Compatible    Unit Number [id number]    Blood Component Type RED CELLS,LR    Unit division 00    Status of Unit ISSUED    Transfusion Status OK TO TRANSFUSE    Crossmatch Result      Compatible Performed at [facility], [place] [address] [place], [place] [address]   Lactic acid, plasma     Status: Abnormal   Collection Time: 06/21/18  5:17 PM  Result Value Ref Range   Lactic Acid, Venous 2.5 (HH) 0.5 - 1.9 mmol/L    Comment: CRITICAL RESULT CALLED TO, READ BACK BY AND VERIFIED WITH: Ouida Sills [id number] @ 1809 BY J SCOTTON Performed at [facility] [address], [place], [place] [address]   Prepare  Pheresed Platelets     Status: None   Collection Time: 06/21/18  5:30 PM  Result Value Ref Range   Unit Number [id number]    Blood Component Type PLTPHER LR1    Unit division 00    Status of Unit ISSUED,FINAL    Transfusion Status      OK TO TRANSFUSE Performed at [facility] [address] [place], [place] [address]   Procalcitonin - Baseline     Status: None   Collection Time: 06/21/18  5:32 PM  Result Value Ref Range   Procalcitonin 1.08 ng/mL    Comment:        Interpretation: PCT > 0.5 ng/mL and <= 2 ng/mL: Systemic infection (sepsis) is possible, but other conditions are known to elevate PCT as well. (NOTE)       Sepsis PCT Algorithm           Lower Respiratory Tract                                      Infection PCT Algorithm    ----------------------------     ----------------------------         PCT < 0.25 ng/mL                PCT < 0.10 ng/mL         Strongly encourage             Strongly discourage   discontinuation of antibiotics    initiation of antibiotics    ----------------------------     -----------------------------  PCT 0.25 - 0.50 ng/mL            PCT 0.10 - 0.25 ng/mL               OR       >80% decrease in PCT            Discourage initiation of                                            antibiotics      Encourage discontinuation           of antibiotics    ----------------------------     -----------------------------         PCT >= 0.50 ng/mL              PCT 0.26 - 0.50 ng/mL                AND       <80% decrease in PCT             Encourage initiation of                                             antibiotics       Encourage continuation           of antibiotics    ----------------------------     -----------------------------        PCT >= 0.50 ng/mL                  PCT > 0.50 ng/mL               AND         increase in PCT                  Strongly encourage                                      initiation of  antibiotics    Strongly encourage escalation           of antibiotics                                     -----------------------------                                           PCT <= 0.25 ng/mL                                                 OR                                        > 80% decrease in PCT  Discontinue / Do not initiate                                             antibiotics Performed at [facility] [address] [place], [place] [address]   MRSA PCR Screening     Status: None   Collection Time: 06/21/18  8:48 PM  Result Value Ref Range   MRSA by PCR NEGATIVE NEGATIVE    Comment:        The GeneXpert MRSA Assay (FDA approved for NASAL specimens only), is one component of a comprehensive MRSA colonization surveillance program. It is not intended to diagnose MRSA infection nor to guide or monitor treatment for MRSA infections. Performed at [facility], [place] [address] [place], [place] [address]   Comprehensive metabolic panel     Status: Abnormal   Collection Time: 06/22/18  2:26 AM  Result Value Ref Range   Sodium 138 135 - 145 mmol/L   Potassium 3.4 (L) 3.5 - 5.1 mmol/L    Comment: DELTA CHECK NOTED REPEATED TO VERIFY NO VISIBLE HEMOLYSIS    Chloride 104 98 - 111 mmol/L   CO2 23 22 - 32 mmol/L   Glucose, Bld 158 (H) 70 - 99 mg/dL   BUN 48 (H) 8 - 23 mg/dL   Creatinine, Ser 1.56 (H) 0.61 - 1.24 mg/dL   Calcium 8.1 (L) 8.9 - 10.3 mg/dL   Total Protein 5.1 (L) 6.5 - 8.1 g/dL   Albumin 2.9 (L) 3.5 - 5.0 g/dL   AST 17 15 - 41 U/L   ALT 21 0 - 44 U/L   Alkaline Phosphatase 42 38 - 126 U/L   Total Bilirubin 1.4 (H) 0.3 - 1.2 mg/dL   GFR calc non Af Amer 46 (L) >60 mL/min   GFR calc Af Amer 53 (L) >60 mL/min   Anion gap 11 5 - 15    Comment: Performed at [facility], [place] [address] [place], [place] [address]  CBC     Status: Abnormal   Collection Time:  06/22/18  2:26 AM  Result Value Ref Range   WBC 0.1 (LL) 4.0 - 10.5 K/uL    Comment: REPEATED TO VERIFY WHITE COUNT CONFIRMED ON SMEAR CRITICAL VALUE NOTED.  VALUE IS CONSISTENT WITH PREVIOUSLY REPORTED AND CALLED VALUE.    RBC 2.52 (L) 4.22 - 5.81 MIL/uL   Hemoglobin 7.8 (L) 13.0 - 17.0 g/dL    Comment: REPEATED TO VERIFY POST TRANSFUSION SPECIMEN DELTA CHECK NOTED    HCT 24.7 (L) 39.0 - 52.0 %   MCV 98.0 80.0 - 100.0 fL   MCH 31.5 26.0 - 34.0 pg   MCHC 32.8 30.0 - 36.0 g/dL   RDW 13.9 11.5 - 15.5 %   Platelets 28 (LL) 150 - 400 K/uL    Comment: REPEATED TO VERIFY PLATELET COUNT CONFIRMED BY SMEAR SPECIMEN CHECKED FOR CLOTS Immature Platelet Fraction may be clinically indicated, consider ordering this additional test [id number]    nRBC 0.0 0.0 - 0.2 %    Comment: Performed at [facility], [place] [address], [place], [place] [address]  Magnesium     Status: Abnormal   Collection Time: 06/22/18  2:26 AM  Result Value Ref Range   Magnesium 1.5 (L) 1.7 - 2.4 mg/dL    Comment: Performed at [facility], [place]  [address], [place], [place] [address]  Procalcitonin     Status: None   Collection Time: 06/22/18  2:26 AM  Result Value Ref Range   Procalcitonin 2.81 ng/mL    Comment:        Interpretation: PCT > 2 ng/mL: Systemic infection (sepsis) is likely, unless other causes are known. (NOTE)       Sepsis PCT Algorithm           Lower Respiratory Tract                                      Infection PCT Algorithm    ----------------------------     ----------------------------         PCT < 0.25 ng/mL                PCT < 0.10 ng/mL         Strongly encourage             Strongly discourage   discontinuation of antibiotics    initiation of antibiotics    ----------------------------     -----------------------------       PCT 0.25 - 0.50 ng/mL            PCT 0.10 - 0.25 ng/mL               OR       >80% decrease in PCT            Discourage  initiation of                                            antibiotics      Encourage discontinuation           of antibiotics    ----------------------------     -----------------------------         PCT >= 0.50 ng/mL              PCT 0.26 - 0.50 ng/mL               AND       <80% decrease in PCT              Encourage initiation of                                             antibiotics       Encourage continuation           of antibiotics    ----------------------------     -----------------------------        PCT >= 0.50 ng/mL                  PCT > 0.50 ng/mL               AND         increase in PCT                  Strongly encourage  initiation of antibiotics    Strongly encourage escalation           of antibiotics                                     -----------------------------                                           PCT <= 0.25 ng/mL                                                 OR                                        > 80% decrease in PCT                                     Discontinue / Do not initiate                                             antibiotics Performed at [facility] [address] [place], [place] [address]   Prepare RBC     Status: None   Collection Time: 06/22/18  7:12 AM  Result Value Ref Range   Order Confirmation      ORDER PROCESSED BY BLOOD BANK Performed at [facility] [address] [place], [place] [address]       Component Value Date/Time   SDES  06/21/2018 1522    BLOOD RIGHT FOREARM Performed at [facility], [place] [address], [place], [place] [address]    SPECREQUEST  06/21/2018 1522    BOTTLES DRAWN AEROBIC AND ANAEROBIC Blood Culture results may not be optimal due to an excessive volume of blood received in culture bottles Performed at [facility], [place] [address] [place], [place] [address]    CULT   06/21/2018 1522    NO GROWTH < 24 HOURS Performed at [facility] [address], [place], [place] [address]    REPTSTATUS PENDING 06/21/2018 1522   Dg Chest Port 1 View  Result Date: 06/21/2018 CLINICAL DATA:  Fever and weakness EXAM: PORTABLE CHEST 1 VIEW COMPARISON:  None. FINDINGS: The heart size and mediastinal contours are within normal limits. Both lungs are clear. The visualized skeletal structures are unremarkable. There is a right chest wall power-injectable Port-A-Cath with tip in the lower SVC via a right internal jugular vein approach. IMPRESSION: No active disease. Electronically Signed   By: Ulyses Jarred M.D.   On: 06/21/2018 16:00   Recent Results (from the past 240 hour(s))  Culture, blood (routine x 2)     Status: None (Preliminary result)   Collection Time: 06/21/18  2:13 PM  Result Value Ref Range Status   Specimen Description   Final    BLOOD RIGHT ANTECUBITAL Performed at [facility]  [place] [address] [place], [place] [address]    Special Requests   Final    BOTTLES DRAWN AEROBIC AND ANAEROBIC Blood Culture results may not be optimal due to an excessive volume of blood received in culture bottles Performed at [facility] [address] [place], [place] [address]    Culture  Setup Time   Final    GRAM POSITIVE COCCI Organism ID to follow CRITICAL RESULT CALLED TO, READ BACK BY AND VERIFIED WITH: PHRMD J GADHIA $Remove'[email or web address]$  06/22/2018 BY S GEZAHEGN Performed at [facility], [place] [address], [place], [place] [address]    Culture GRAM POSITIVE COCCI  Final   Report Status PENDING  Incomplete  Blood Culture ID Panel (Reflexed)     Status: Abnormal   Collection Time: 06/21/18  2:13 PM  Result Value Ref Range Status   Enterococcus species NOT DETECTED NOT DETECTED Final   Listeria monocytogenes NOT DETECTED NOT DETECTED Final   Staphylococcus species DETECTED (A) NOT DETECTED Final    Comment: CRITICAL RESULT CALLED TO,  READ BACK BY AND VERIFIED WITH: PHRMD J GADHIA $Remove'@0846'[id number]$  06/22/2018 BY S GEZAHEGN    Staphylococcus aureus (BCID) DETECTED (A) NOT DETECTED Final    Comment: Methicillin (oxacillin) susceptible Staphylococcus aureus (MSSA). Preferred therapy is anti staphylococcal beta lactam antibiotic (Cefazolin or Nafcillin), unless clinically contraindicated. CRITICAL RESULT CALLED TO, READ BACK BY AND VERIFIED WITH: PHRMD J GADHIA $Remove'@0846'[id number]$  06/22/2018 BY S GEZAHEGN    Methicillin resistance NOT DETECTED NOT DETECTED Final   Streptococcus species NOT DETECTED NOT DETECTED Final   Streptococcus agalactiae NOT DETECTED NOT DETECTED Final   Streptococcus pneumoniae NOT DETECTED NOT DETECTED Final   Streptococcus pyogenes NOT DETECTED NOT DETECTED Final   Acinetobacter baumannii NOT DETECTED NOT DETECTED Final   Enterobacteriaceae species NOT DETECTED NOT DETECTED Final   Enterobacter cloacae complex NOT DETECTED NOT DETECTED Final   Escherichia coli NOT DETECTED NOT DETECTED Final   Klebsiella oxytoca NOT DETECTED NOT DETECTED Final   Klebsiella pneumoniae NOT DETECTED NOT DETECTED Final   Proteus species NOT DETECTED NOT DETECTED Final   Serratia marcescens NOT DETECTED NOT DETECTED Final   Haemophilus influenzae NOT DETECTED NOT DETECTED Final   Neisseria meningitidis NOT DETECTED NOT DETECTED Final   Pseudomonas aeruginosa NOT DETECTED NOT DETECTED Final   Candida albicans NOT DETECTED NOT DETECTED Final   Candida glabrata NOT DETECTED NOT DETECTED Final   Candida krusei NOT DETECTED NOT DETECTED Final   Candida parapsilosis NOT DETECTED NOT DETECTED Final   Candida tropicalis NOT DETECTED NOT DETECTED Final    Comment: Performed at [facility], [place]. [address], [place], [place] [address]  Culture, blood (routine x 2)     Status: None (Preliminary result)   Collection Time: 06/21/18  3:22 PM  Result Value Ref Range Status   Specimen Description   Final    BLOOD RIGHT FOREARM Performed at [facility] [address] [place], [place] [address]    Special Requests   Final    BOTTLES DRAWN AEROBIC AND ANAEROBIC Blood Culture results may not be optimal due to an excessive volume of blood received in culture bottles Performed at [facility] [address], [place], [place] [address]    Culture   Final    NO GROWTH < 24 HOURS Performed at [facility] [address], [place], [place] [address]    Report Status PENDING  Incomplete  MRSA PCR Screening     Status: None   Collection Time: 06/21/18  8:48 PM  Result Value Ref Range Status   MRSA by PCR NEGATIVE NEGATIVE Final    Comment:        The GeneXpert MRSA Assay (FDA approved for NASAL specimens only), is one component of a comprehensive MRSA colonization surveillance program. It is not intended to diagnose MRSA infection nor to guide or monitor treatment for MRSA infections. Performed at [facility], [place] [address] [place], [place] [address]     Microbiology: Recent Results (from the past 240 hour(s))  Culture, blood (routine x 2)     Status: None (Preliminary result)   Collection Time: 06/21/18  2:13 PM  Result Value Ref Range Status   Specimen Description   Final    BLOOD RIGHT ANTECUBITAL Performed at [facility] [address], [place], [place] [address]    Special Requests   Final    BOTTLES DRAWN AEROBIC AND ANAEROBIC Blood Culture results may not be optimal due to an excessive volume of blood received in culture bottles Performed at [facility] [address], [place], [place] [address]    Culture  Setup Time   Final    GRAM POSITIVE COCCI Organism ID to follow CRITICAL RESULT CALLED TO, READ BACK BY AND VERIFIED WITH: PHRMD J GADHIA $Remove'[email or web address]$  06/22/2018 BY S GEZAHEGN Performed at [facility], [place] [address], [place], [place] [address]    Culture GRAM POSITIVE COCCI  Final   Report Status  PENDING  Incomplete  Blood Culture ID Panel (Reflexed)     Status: Abnormal   Collection Time: 06/21/18  2:13 PM  Result Value Ref Range Status   Enterococcus species NOT DETECTED NOT DETECTED Final   Listeria monocytogenes NOT DETECTED NOT DETECTED Final   Staphylococcus species DETECTED (A) NOT DETECTED Final    Comment: CRITICAL RESULT CALLED TO, READ BACK BY AND VERIFIED WITH: PHRMD J GADHIA $Remove'[email or web address]$  06/22/2018 BY S GEZAHEGN    Staphylococcus aureus (BCID) DETECTED (A) NOT DETECTED Final    Comment: Methicillin (oxacillin) susceptible Staphylococcus aureus (MSSA). Preferred therapy is anti staphylococcal beta lactam antibiotic (Cefazolin or Nafcillin), unless clinically contraindicated. CRITICAL RESULT CALLED TO, READ BACK BY AND VERIFIED WITH: PHRMD J GADHIA $Remove'[email or web address]$  06/22/2018 BY S GEZAHEGN    Methicillin resistance NOT DETECTED NOT DETECTED Final   Streptococcus species NOT DETECTED NOT DETECTED Final   Streptococcus agalactiae NOT DETECTED NOT DETECTED Final   Streptococcus pneumoniae NOT DETECTED NOT DETECTED Final   Streptococcus pyogenes NOT DETECTED NOT DETECTED Final   Acinetobacter baumannii NOT DETECTED NOT DETECTED Final   Enterobacteriaceae species NOT DETECTED NOT DETECTED Final   Enterobacter cloacae complex NOT DETECTED NOT DETECTED Final   Escherichia coli NOT DETECTED NOT DETECTED Final   Klebsiella oxytoca NOT DETECTED NOT DETECTED Final   Klebsiella pneumoniae NOT DETECTED NOT DETECTED Final   Proteus species NOT DETECTED NOT DETECTED Final   Serratia marcescens NOT DETECTED NOT DETECTED Final   Haemophilus influenzae NOT DETECTED NOT DETECTED Final   Neisseria meningitidis NOT DETECTED NOT DETECTED Final   Pseudomonas aeruginosa NOT DETECTED NOT DETECTED Final   Candida albicans NOT DETECTED NOT DETECTED Final   Candida glabrata NOT DETECTED NOT DETECTED Final   Candida krusei NOT DETECTED NOT DETECTED Final   Candida parapsilosis NOT DETECTED NOT DETECTED Final    Candida tropicalis NOT DETECTED NOT DETECTED Final    Comment: Performed at [facility], [place] [address] [place], [place] [address]  Culture, blood (routine x 2)     Status: None (Preliminary result)   Collection Time: 06/21/18  3:22 PM  Result Value Ref Range Status   Specimen Description   Final    BLOOD RIGHT FOREARM Performed at [facility] [address] [place], [place] [address]    Special Requests   Final    BOTTLES DRAWN AEROBIC AND ANAEROBIC Blood Culture results may not be optimal due to an excessive volume of blood received in culture bottles Performed at [facility] [address], [place], [place] [address]    Culture   Final    NO GROWTH < 24 HOURS Performed at [facility] [address], [place],  [address]    Report Status PENDING  Incomplete  MRSA PCR Screening     Status: None   Collection Time: 06/21/18  8:48 PM  Result Value Ref Range Status   MRSA by PCR NEGATIVE NEGATIVE Final    Comment:        The GeneXpert MRSA Assay (FDA approved for NASAL specimens only), is one component of a comprehensive MRSA colonization surveillance program. It is not intended to diagnose MRSA infection nor to guide or monitor treatment for MRSA infections. Performed at [facility], [place] [address] [place],  [address]     Radiographs and labs were personally reviewed by me.   Bobby Rumpf, MD [facility] [phone] 06/22/2018, 1:37 PM

## 2018-06-22 NOTE — Progress Notes (Signed)
PHARMACY NOTE:  ANTIMICROBIAL RENAL DOSAGE ADJUSTMENT  Current antimicrobial regimen includes a mismatch between antimicrobial dosage and estimated renal function.  As per policy approved by the Pharmacy & Therapeutics and Medical Executive Committees, the antimicrobial dosage will be adjusted accordingly.  Current antimicrobial dosage:  Cefepime 2g IV q12h  Indication: MSSA Bacteremia  Renal Function:  Estimated Creatinine Clearance: 64.2 mL/min (A) (by C-G formula based on SCr of 1.56 mg/dL (H)).    Antimicrobial dosage has been changed to:  Cefepime 2g IV q8h   Thank you for allowing pharmacy to be a part of this patient's care.  Leeroy Bock, [facility] 06/22/2018 1:57 PM

## 2018-06-22 NOTE — TOC Initial Note (Signed)
Transition of Care [facility]) - Initial/Assessment Note    Patient Details  Name: [name] Rose MRN: [id number] Date of Birth: May 25, 1950  [facility]) CM/SW Contact:    Purcell Mouton, RN Phone Number: 06/22/2018, 1:12 PM  Clinical Narrative:   Pt admitted with Neutropenic fever              Expected Discharge Plan: Home w Home Health Services Barriers to Discharge: No Barriers Identified   Patient Goals and CMS Choice  Plan to return home      Expected Discharge Plan and Services Expected Discharge Plan: [place]       Expected Discharge Date: (unknown)                        Prior Living Arrangements/Services  Live with Spouse in private home                     Activities of Daily Living Home Assistive Devices/Equipment: Eyeglasses ADL Screening (condition at time of admission) Patient's cognitive ability adequate to safely complete daily activities?: Yes Is the patient deaf or have difficulty hearing?: No Does the patient have difficulty seeing, even when wearing glasses/contacts?: No Does the patient have difficulty concentrating, remembering, or making decisions?: No Patient able to express need for assistance with ADLs?: Yes Does the patient have difficulty dressing or bathing?: Yes Independently performs ADLs?: No Communication: Independent Dressing (OT): Needs assistance Is this a change from baseline?: Change from baseline, expected to last >3 days Grooming: Needs assistance Is this a change from baseline?: Change from baseline, expected to last >3 days Feeding: Independent Bathing: Needs assistance Is this a change from baseline?: Change from baseline, expected to last >3 days Toileting: Needs assistance Is this a change from baseline?: Change from baseline, expected to last >3days In/Out Bed: Needs assistance Is this a change from baseline?: Change from baseline, expected to last >3 days Walks in Home:  Needs assistance Is this a change from baseline?: Change from baseline, expected to last >3 days Does the patient have difficulty walking or climbing stairs?: Yes(secondary to weakness) Weakness of Legs: Both Weakness of Arms/Hands: Both  Permission Sought/Granted                  Emotional Assessment              Admission diagnosis:  Thrombocytopenia ([place]) [D69.6] New onset a-fib (HCC) [I48.91] Neutropenic fever ([place]) [D70.9, R50.81] Sepsis, due to unspecified organism, unspecified whether acute organ dysfunction present [facility]) [A41.9] Patient Active Problem List   Diagnosis Date Noted  . Mucositis due to chemotherapy 06/22/2018  . AKI (acute kidney injury) ([place]) 06/22/2018  . Pancytopenia ([place]) 06/22/2018  . Neutropenic fever ([place]) 06/21/2018  . Sepsis ([place])   . Thrombocytopenia ([place])   . New onset a-fib ([place])   . Abscess of abdominal cavity ([place]) 12/19/2017  . IDA (iron deficiency anemia) 09/08/2017  . 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 Anorexia 10/17/2016  . Diarrhea 10/17/2016  . LFT elevation 10/17/2016  . Dehydration 10/17/2016  . Fatigue 10/17/2016  . Hypocalcemia 10/17/2016  . Goals of care, counseling/discussion 09/30/2016  . 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 ([place]) 09/29/2016  . Cancer of dome of urinary bladder ([place]) 09/20/2016  . Postoperative anemia due to acute blood loss 03/04/2015  . Hypokalemia 03/04/2015  . 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/03/2015  PCP:  Shirline Frees, MD Pharmacy:   [id number] [place], [place] [address] Phone: [phone] Fax: [place] [facility] [place], [place] - [address] [place] [address] Phone: [phone] Fax: [phone]     Social Determinants of Health (SDOH) Interventions    Readmission Risk  Interventions 30 Day Unplanned Readmission Risk Score     ED to Hosp-Admission (Current) from 06/21/2018 in [facility]/STEPDOWN  30 Day Unplanned Readmission Risk Score (%)  23 Filed at 06/22/2018 1200     This score is the patient's risk of an unplanned readmission within 30 days of being discharged (0 -100%). The score is based on dignosis, age, lab data, medications, orders, and past utilization.   Low:  0-14.9   Medium: 15-21.9   High: 22-29.9   Extreme: 30 and above       No flowsheet data found.

## 2018-06-22 NOTE — Progress Notes (Signed)
Pharmacy Antibiotic Note  [name] Rose is a 68 y.o. male with a h/o stage IV bladder cancer admitted on 06/21/2018 with generalized weakness.  Bcx + MSSA.  Pharmacy consulted for Ancef dosing.  Renal function trending to baseline, est CrCl~60-65 ml/min  06/22/2018:  Plan: Ancef 2gm IV q8h No dose adjustments anticipated- pharmacy to sign off dosing & monitor peripherally  Height: $Remove'6\' 2"'[id number]$  (188 cm) Weight: 265 lb (120.2 kg) IBW/kg (Calculated) : 82.2  Temp (24hrs), Avg:99.4 F (37.4 C), Min:98.5 F (36.9 C), Max:101.7 F (38.7 C)  Recent Labs  Lab 06/21/18 1435 06/21/18 1438 06/21/18 1717 06/22/18 0226  WBC  --  0.2*  --  0.1*  CREATININE  --  1.69*  --  1.56*  LATICACIDVEN 3.1*  --  2.5*  --     Estimated Creatinine Clearance: 64.2 mL/min (A) (by C-G formula based on SCr of 1.56 mg/dL (H)).    No Known Allergies  Antimicrobials this admission: 3/11 cefepime >> 3/12 3/11 vanc >> 3/12 3/12 Ancef>>   Dose adjustments this admission:   Microbiology results: 3/11 BCx: GPC      3/12 BCID:  MSSA  3/11 UCx:   3/11 MRSA PCR: neg Thank you for allowing pharmacy to be a part of this patient's care.  Biagio Borg 06/22/2018 2:25 PM

## 2018-06-23 ENCOUNTER — Encounter (HOSPITAL_COMMUNITY): Payer: Self-pay | Admitting: Cardiology

## 2018-06-23 ENCOUNTER — Other Ambulatory Visit: Payer: Self-pay

## 2018-06-23 ENCOUNTER — Inpatient Hospital Stay (HOSPITAL_COMMUNITY): Payer: PPO

## 2018-06-23 DIAGNOSIS — E785 Hyperlipidemia, unspecified: Secondary | ICD-10-CM

## 2018-06-23 DIAGNOSIS — I10 Essential (primary) hypertension: Secondary | ICD-10-CM

## 2018-06-23 DIAGNOSIS — D61818 Other pancytopenia: Secondary | ICD-10-CM

## 2018-06-23 DIAGNOSIS — R739 Hyperglycemia, unspecified: Secondary | ICD-10-CM

## 2018-06-23 DIAGNOSIS — I480 Paroxysmal atrial fibrillation: Secondary | ICD-10-CM

## 2018-06-23 HISTORY — DX: Hyperlipidemia, unspecified: E78.5

## 2018-06-23 HISTORY — DX: Essential (primary) hypertension: I10

## 2018-06-23 HISTORY — DX: Hyperglycemia, unspecified: R73.9

## 2018-06-23 LAB — CBC
HCT: 29.8 % — ABNORMAL LOW (ref 39.0–52.0)
Hemoglobin: 9.6 g/dL — ABNORMAL LOW (ref 13.0–17.0)
MCH: 30 pg (ref 26.0–34.0)
MCHC: 32.2 g/dL (ref 30.0–36.0)
MCV: 93.1 fL (ref 80.0–100.0)
Platelets: 15 10*3/uL — CL (ref 150–400)
RBC: 3.2 MIL/uL — ABNORMAL LOW (ref 4.22–5.81)
RDW: 14.7 % (ref 11.5–15.5)
WBC: 0.2 10*3/uL — CL (ref 4.0–10.5)
nRBC: 0 % (ref 0.0–0.2)

## 2018-06-23 LAB — COMPREHENSIVE METABOLIC PANEL
ALT: 18 U/L (ref 0–44)
AST: 14 U/L — ABNORMAL LOW (ref 15–41)
Albumin: 2.9 g/dL — ABNORMAL LOW (ref 3.5–5.0)
Alkaline Phosphatase: 35 U/L — ABNORMAL LOW (ref 38–126)
Anion gap: 11 (ref 5–15)
BUN: 36 mg/dL — ABNORMAL HIGH (ref 8–23)
CO2: 26 mmol/L (ref 22–32)
Calcium: 8.1 mg/dL — ABNORMAL LOW (ref 8.9–10.3)
Chloride: 101 mmol/L (ref 98–111)
Creatinine, Ser: 1.56 mg/dL — ABNORMAL HIGH (ref 0.61–1.24)
GFR calc Af Amer: 53 mL/min — ABNORMAL LOW (ref >60)
GFR calc non Af Amer: 46 mL/min — ABNORMAL LOW (ref >60)
Glucose, Bld: 126 mg/dL — ABNORMAL HIGH (ref 70–99)
Potassium: 3 mmol/L — ABNORMAL LOW (ref 3.5–5.1)
Sodium: 138 mmol/L (ref 135–145)
Total Bilirubin: 1.2 mg/dL (ref 0.3–1.2)
Total Protein: 5.3 g/dL — ABNORMAL LOW (ref 6.5–8.1)

## 2018-06-23 LAB — TYPE AND SCREEN
ABO/RH(D): A POS
Antibody Screen: NEGATIVE
Unit division: 0
Unit division: 0

## 2018-06-23 LAB — BPAM RBC
Blood Product Expiration Date: 202004042359
Blood Product Expiration Date: 202004062359
ISSUE DATE / TIME: 202003120832
ISSUE DATE / TIME: 202003121116
Unit Type and Rh: 6200
Unit Type and Rh: 6200

## 2018-06-23 LAB — TSH: TSH: 0.455 u[IU]/mL (ref 0.350–4.500)

## 2018-06-23 LAB — PROCALCITONIN: Procalcitonin: 17.47 ng/mL

## 2018-06-23 LAB — URINE CULTURE: Culture: 10000 — AB

## 2018-06-23 LAB — LACTIC ACID, PLASMA: Lactic Acid, Venous: 1.8 mmol/L (ref 0.5–1.9)

## 2018-06-23 MED ORDER — ONDANSETRON HCL 4 MG/2ML IJ SOLN
4.0000 mg | Freq: Four times a day (QID) | INTRAMUSCULAR | Status: DC | PRN
  Administered 2018-06-23: 4 mg via INTRAVENOUS
  Filled 2018-06-23: qty 2

## 2018-06-23 MED ORDER — CHLORHEXIDINE GLUCONATE 0.12 % MT SOLN
15.0000 mL | Freq: Four times a day (QID) | OROMUCOSAL | Status: DC
  Administered 2018-06-23 – 2018-06-29 (×15): 15 mL via OROMUCOSAL
  Filled 2018-06-23 (×16): qty 15

## 2018-06-23 MED ORDER — LORATADINE 10 MG PO TABS
10.0000 mg | ORAL_TABLET | Freq: Every day | ORAL | Status: DC
  Administered 2018-06-23 – 2018-06-29 (×7): 10 mg via ORAL
  Filled 2018-06-23 (×7): qty 1

## 2018-06-23 MED ORDER — SODIUM CHLORIDE 0.9 % IV SOLN
2.0000 g | Freq: Three times a day (TID) | INTRAVENOUS | Status: DC
  Administered 2018-06-23 – 2018-06-26 (×9): 2 g via INTRAVENOUS
  Filled 2018-06-23 (×10): qty 2

## 2018-06-23 MED ORDER — BISACODYL 5 MG PO TBEC
10.0000 mg | DELAYED_RELEASE_TABLET | Freq: Every day | ORAL | Status: DC
  Administered 2018-06-23 – 2018-06-25 (×3): 10 mg via ORAL
  Filled 2018-06-23 (×3): qty 2

## 2018-06-23 MED ORDER — DILTIAZEM HCL ER COATED BEADS 240 MG PO CP24
240.0000 mg | ORAL_CAPSULE | Freq: Every day | ORAL | Status: DC
  Administered 2018-06-23 – 2018-06-29 (×7): 240 mg via ORAL
  Filled 2018-06-23: qty 1
  Filled 2018-06-23 (×5): qty 2
  Filled 2018-06-23: qty 1

## 2018-06-23 MED ORDER — POTASSIUM CHLORIDE 10 MEQ/50ML IV SOLN
10.0000 meq | INTRAVENOUS | Status: AC
  Administered 2018-06-23 (×4): 10 meq via INTRAVENOUS
  Filled 2018-06-23 (×4): qty 50

## 2018-06-23 MED ORDER — METOPROLOL TARTRATE 25 MG PO TABS
25.0000 mg | ORAL_TABLET | Freq: Two times a day (BID) | ORAL | Status: DC
  Administered 2018-06-23 – 2018-06-29 (×13): 25 mg via ORAL
  Filled 2018-06-23 (×14): qty 1

## 2018-06-23 NOTE — Progress Notes (Signed)
INFECTIOUS DISEASE PROGRESS NOTE  ID: [name] Rose is a 68 y.o. male with  Active Problems:   Neutropenic fever (HCC)   Paroxysmal atrial fibrillation (HCC)   Mucositis due to chemotherapy   AKI (acute kidney injury) ([place])   Pancytopenia ([place])   MSSA bacteremia   Essential hypertension   Hyperglycemia   Mild hyperlipidemia  Subjective: No problems.  No palpitations. No problems with port.   Abtx:  Anti-infectives (From admission, onward)   Start     Dose/Rate Route Frequency Ordered Stop   06/23/18 1600  ceFEPIme (MAXIPIME) 2 g in sodium chloride 0.9 % 100 mL IVPB     2 g 200 mL/hr over 30 Minutes Intravenous Every 8 hours 06/23/18 1012     06/22/18 1800  vancomycin (VANCOCIN) 1,250 mg in sodium chloride 0.9 % 250 mL IVPB  Status:  Discontinued     1,250 mg 166.7 mL/hr over 90 Minutes Intravenous Every 24 hours 06/21/18 1740 06/22/18 1245   06/22/18 1600  ceFAZolin (ANCEF) IVPB 2g/100 mL premix  Status:  Discontinued     2 g 200 mL/hr over 30 Minutes Intravenous Every 8 hours 06/22/18 1432 06/23/18 1003   06/22/18 1400  ceFEPIme (MAXIPIME) 2 g in sodium chloride 0.9 % 100 mL IVPB  Status:  Discontinued     2 g 200 mL/hr over 30 Minutes Intravenous Every 8 hours 06/22/18 1358 06/22/18 1432   06/22/18 0400  ceFEPIme (MAXIPIME) 2 g in sodium chloride 0.9 % 100 mL IVPB  Status:  Discontinued     2 g 200 mL/hr over 30 Minutes Intravenous Every 12 hours 06/21/18 1740 06/22/18 1358   06/21/18 1745  vancomycin (VANCOCIN) 2,000 mg in sodium chloride 0.9 % 500 mL IVPB     2,000 mg 250 mL/hr over 120 Minutes Intravenous  Once 06/21/18 1737 06/21/18 2054   06/21/18 1515  ceFEPIme (MAXIPIME) 2 g in sodium chloride 0.9 % 100 mL IVPB     2 g 200 mL/hr over 30 Minutes Intravenous  Once 06/21/18 1500 06/21/18 1631      Medications:  Scheduled: . sodium chloride   Intravenous Once  . chlorhexidine  15 mL Mouth/Throat QID  . Chlorhexidine Gluconate Cloth  6 each Topical Daily   . diltiazem  240 mg Oral Daily  . magic mouthwash w/lidocaine  5 mL Oral QID  . metoprolol tartrate  25 mg Oral BID  . promethazine  12.5 mg Intravenous Once  . sodium bicarbonate/sodium chloride   Mouth Rinse Q4H  . sodium chloride flush  10-40 mL Intracatheter Q12H  . Tbo-Filgrastim  480 mcg Subcutaneous q1800    Objective: Vital signs in last 24 hours: Temp:  [98.4 F (36.9 C)-100.4 F (38 C)] 98.8 F (37.1 C) (03/13 0811) Pulse Rate:  [71-126] 126 (03/13 1000) Resp:  [17-32] 27 (03/13 1000) BP: (110-169)/(49-124) 150/86 (03/13 1000) SpO2:  [90 %-100 %] 100 % (03/13 1000)   General appearance: alert, cooperative and no distress Resp: clear to auscultation bilaterally Chest wall: no tenderness, port non-fluctuant.  Cardio: irregularly irregular rhythm GI: normal findings: bowel sounds normal and soft, non-tender Extremities: edema none  Lab Results Recent Labs    06/22/18 0226 06/23/18 0232  WBC 0.1* 0.2*  HGB 7.8* 9.6*  HCT 24.7* 29.8*  NA 138 138  K 3.4* 3.0*  CL 104 101  CO2 23 26  BUN 48* 36*  CREATININE 1.56* 1.56*   Liver Panel Recent Labs    06/22/18 0226 06/23/18 0232  PROT 5.1* 5.3*  ALBUMIN 2.9* 2.9*  AST 17 14*  ALT 21 18  ALKPHOS 42 35*  BILITOT 1.4* 1.2   Sedimentation Rate No results for input(s): ESRSEDRATE in the last 72 hours. C-Reactive Protein No results for input(s): CRP in the last 72 hours.  Microbiology: Recent Results (from the past 240 hour(s))  Culture, blood (routine x 2)     Status: Abnormal (Preliminary result)   Collection Time: 06/21/18  2:13 PM  Result Value Ref Range Status   Specimen Description   Final    BLOOD RIGHT ANTECUBITAL Performed at [facility] [address], [place], [place] [address]    Special Requests   Final    BOTTLES DRAWN AEROBIC AND ANAEROBIC Blood Culture results may not be optimal due to an excessive volume of blood received in culture bottles Performed at [facility] [address], [place], [place] [address]    Culture  Setup Time   Final    GRAM POSITIVE COCCI Organism ID to follow CRITICAL RESULT CALLED TO, READ BACK BY AND VERIFIED WITH: PHRMD J GADHIA $Remove'[id number]$  06/22/2018 BY S GEZAHEGN    Culture (A)  Final    STAPHYLOCOCCUS AUREUS SUSCEPTIBILITIES TO FOLLOW Performed at [facility], [place] [address], [place], [place] [address]    Report Status PENDING  Incomplete  Blood Culture ID Panel (Reflexed)     Status: Abnormal   Collection Time: 06/21/18  2:13 PM  Result Value Ref Range Status   Enterococcus species NOT DETECTED NOT DETECTED Final   Listeria monocytogenes NOT DETECTED NOT DETECTED Final   Staphylococcus species DETECTED (A) NOT DETECTED Final    Comment: CRITICAL RESULT CALLED TO, READ BACK BY AND VERIFIED WITH: PHRMD J GADHIA $Remove'[email or web address]$  06/22/2018 BY S GEZAHEGN    Staphylococcus aureus (BCID) DETECTED (A) NOT DETECTED Final    Comment: Methicillin (oxacillin) susceptible Staphylococcus aureus (MSSA). Preferred therapy is anti staphylococcal beta lactam antibiotic (Cefazolin or Nafcillin), unless clinically contraindicated. CRITICAL RESULT CALLED TO, READ BACK BY AND VERIFIED WITH: PHRMD J GADHIA $Remove'[id number]$  06/22/2018 BY S GEZAHEGN    Methicillin resistance NOT DETECTED NOT DETECTED Final   Streptococcus species NOT DETECTED NOT DETECTED Final   Streptococcus agalactiae NOT DETECTED NOT DETECTED Final   Streptococcus pneumoniae NOT DETECTED NOT DETECTED Final   Streptococcus pyogenes NOT DETECTED NOT DETECTED Final   Acinetobacter baumannii NOT DETECTED NOT DETECTED Final   Enterobacteriaceae species NOT DETECTED NOT DETECTED Final   Enterobacter cloacae complex NOT DETECTED NOT DETECTED Final   Escherichia coli NOT DETECTED NOT DETECTED Final   Klebsiella oxytoca NOT DETECTED NOT DETECTED Final   Klebsiella pneumoniae NOT DETECTED NOT DETECTED Final   Proteus species NOT DETECTED NOT DETECTED Final   Serratia  marcescens NOT DETECTED NOT DETECTED Final   Haemophilus influenzae NOT DETECTED NOT DETECTED Final   Neisseria meningitidis NOT DETECTED NOT DETECTED Final   Pseudomonas aeruginosa NOT DETECTED NOT DETECTED Final   Candida albicans NOT DETECTED NOT DETECTED Final   Candida glabrata NOT DETECTED NOT DETECTED Final   Candida krusei NOT DETECTED NOT DETECTED Final   Candida parapsilosis NOT DETECTED NOT DETECTED Final   Candida tropicalis NOT DETECTED NOT DETECTED Final    Comment: Performed at [facility], [place]. [address] [place] [address], [place], [place] [address]  Culture, blood (routine x 2)     Status: None (Preliminary result)   Collection Time: 06/21/18  3:22 PM  Result Value Ref Range Status  Specimen Description   Final    BLOOD RIGHT FOREARM Performed at [facility] [address], [place], [place] [address]    Special Requests   Final    BOTTLES DRAWN AEROBIC AND ANAEROBIC Blood Culture results may not be optimal due to an excessive volume of blood received in culture bottles Performed at [facility] [address] [place], [place] [address]    Culture   Final    NO GROWTH < 24 HOURS Performed at [facility] [address], [place], [place] [address]    Report Status PENDING  Incomplete  MRSA PCR Screening     Status: None   Collection Time: 06/21/18  8:48 PM  Result Value Ref Range Status   MRSA by PCR NEGATIVE NEGATIVE Final    Comment:        The GeneXpert MRSA Assay (FDA approved for NASAL specimens only), is one component of a comprehensive MRSA colonization surveillance program. It is not intended to diagnose MRSA infection nor to guide or monitor treatment for MRSA infections. Performed at [facility], [place] [address], [place], [place] [address]     Studies/Results: Dg Chest Port 1 View  Result Date: 06/23/2018 CLINICAL DATA:  Cough, shortness of breath. EXAM: PORTABLE CHEST 1 VIEW COMPARISON:   Radiograph of June 21, 2018. FINDINGS: Stable cardiomediastinal silhouette. Right internal jugular Port-A-Cath is unchanged in position. No pneumothorax or pleural effusion is noted. No acute pulmonary disease is noted. Bony thorax is unremarkable. IMPRESSION: No active disease. Electronically Signed   By: Marijo Conception, M.D.   On: 06/23/2018 09:12   Dg Chest Port 1 View  Result Date: 06/21/2018 CLINICAL DATA:  Fever and weakness EXAM: PORTABLE CHEST 1 VIEW COMPARISON:  None. FINDINGS: The heart size and mediastinal contours are within normal limits. Both lungs are clear. The visualized skeletal structures are unremarkable. There is a right chest wall power-injectable Port-A-Cath with tip in the lower SVC via a right internal jugular vein approach. IMPRESSION: No active disease. Electronically Signed   By: Ulyses Jarred M.D.   On: 06/21/2018 16:00   [place] Chest Soft Tissue  Result Date: 06/22/2018 CLINICAL DATA:  Palpable nodule near the right chest wall Port-A-Cath. This lies near the medial right clavicle. EXAM: ULTRASOUND OF CHEST SOFT TISSUES TECHNIQUE: Ultrasound examination of the chest wall soft tissues was performed in the area of clinical concern. COMPARISON:  None. FINDINGS: Start graphic assessment of the soft tissues adjacent to the indwelling anterior superior right chest wall Port-A-Cath was performed. Shadowing is seen from the Port-A-Cath. No soft tissue mass or fluid collection. No abnormal echogenicity. IMPRESSION: No soft tissue mass or fluid collection. Electronically Signed   By: Lajean Manes M.D.   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  No evidence of infection around port.  Will send repeat BCx TEE when more stable, appreciate CV eval.           Bobby Rumpf MD, FACP Infectious Diseases (pager) [phone] [email or web address] 06/23/2018, 10:59 AM  LOS: 2 days

## 2018-06-23 NOTE — H&P (View-Only) (Signed)
CARDIOLOGY CONSULT NOTE  Patient ID: [name] Rose MRN: [id number] DOB/AGE: November 20, 1950 68 y.o.  Admit date: 06/21/2018 Referring Physician  Jacki Cones, MD Primary Physician:  Shirline Frees, MD Reason for Consultation  A. Fib  HPI: [name] Rose  is a 68 y.o. male  With hypertension, prediabetes mellitus, obesity, metastatic bladder cancer presently undergoing chemotherapy, admitted to the hospital with failure to thrive, pancytopenia, sepsis and found to have MSSA bacteremia.  Patient developed paroxysmal episodes of atrial fibrillation with rapid ventricular response, consultation obtained.  Patient has chronic shortness of breath and dyspnea on exertion.  He also states that he has chronic cough and it is seasonal especially during spring.  He denies PND or orthopnea.  Denies leg edema.  He has never had any chest pain.  Past Medical History:  Diagnosis Date  . Arthritis   . 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 ([place]) 09/29/2016  . Bladder tumor   . Goals of care, counseling/discussion 09/30/2016  . History of prostate cancer followed by pcp dr [name]-  per pt last PSA undetectable   dx 2008-- (Stage T1c, Gleason 3+3,  PSA 4.58, vol 99cc)  s/p  radical prostatectomy (nerve sparing bilateral)   . Hypertension   . Lower urinary tract symptoms (LUTS)   . Pre-diabetes   . Wears glasses      Past Surgical History:  Procedure Laterality Date  . CATARACT EXTRACTION W/ INTRAOCULAR LENS  IMPLANT, BILATERAL Bilateral 2011  . [place]  . IR FLUORO GUIDE PORT INSERTION RIGHT  10/07/2016  . IR RADIOLOGIST EVAL & MGMT  01/05/2018  . IR US GUIDE VASC ACCESS RIGHT  10/07/2016  . KNEE ARTHROSCOPY Bilateral right 2006;  left 02-15-2007  . [place];  1990;  1983  . ROBOT ASSISTED LAPAROSCOPIC RADICAL PROSTATECTOMY  06/20/2006   bilateral nerve sparing  . TOTAL HIP ARTHROPLASTY Left 03/03/2015   Procedure: LEFT TOTAL HIP  ARTHROPLASTY ANTERIOR APPROACH;  Surgeon: Dorna Leitz, MD;  Location: [facility];  Service: Orthopedics;  Laterality: Left;  . TOTAL KNEE ARTHROPLASTY Bilateral left 08-13-2009;  right 12-26-2009  . TRANSURETHRAL RESECTION OF BLADDER TUMOR N/A 09/20/2016   Procedure: TRANSURETHRAL RESECTION OF BLADDER TUMOR (TURBT);  Surgeon: Franchot Gallo, MD;  Location: [facility];  Service: Urology;  Laterality: N/A;     Family History  Problem Relation Age of Onset  . Hypertension Mother   . Aneurysm Mother   . Emphysema Father   . Hypertension Father      Social History: Social History   Socioeconomic History  . Marital status: Married    Spouse name: Not on file  . Number of children: Not on file  . Years of education: Not on file  . Highest education level: Not on file  Occupational History  . Not on file  Social Needs  . Financial resource strain: Not on file  . Food insecurity:    Worry: Not on file    Inability: Not on file  . Transportation needs:    Medical: Not on file    Non-medical: Not on file  Tobacco Use  . Smoking status: Former Smoker    Years: 16.00    Types: Cigarettes    Last attempt to quit: 09/25/1984    Years since quitting: 33.7  . Smokeless tobacco: Never Used  Substance and Sexual Activity  . Alcohol use: Yes    Comment: occasionally  . Drug use: No  . Sexual  activity: Not on file  Lifestyle  . Physical activity:    Days per week: Not on file    Minutes per session: Not on file  . Stress: Not on file  Relationships  . Social connections:    Talks on phone: Not on file    Gets together: Not on file    Attends religious service: Not on file    Active member of club or organization: Not on file    Attends meetings of clubs or organizations: Not on file    Relationship status: Not on file  . Intimate partner violence:    Fear of current or ex partner: Not on file    Emotionally abused: Not on file    Physically abused: Not on file     Forced sexual activity: Not on file  Other Topics Concern  . Not on file  Social History Narrative  . Not on file     Medications Prior to Admission  Medication Sig Dispense Refill Last Dose  . amLODipine (NORVASC) 10 MG tablet Take 10 mg by mouth daily.   06/20/2018 at Unknown time  . bisoprolol-hydrochlorothiazide (ZIAC) 10-6.25 MG tablet Take 1 tablet by mouth daily.   06/20/2018 at Unknown time  . Cinnamon 500 MG TABS Take 1 tablet by mouth 2 (two) times daily.    06/20/2018 at Unknown time  . dexamethasone (DECADRON) 4 MG tablet Take 2 tablets by mouth once a day on the day after chemotherapy and then take 2 tablets two times a day for 2 days. Take with food. 30 tablet 1 Past Week at Unknown time  . gabapentin (NEURONTIN) 100 MG capsule TAKE 3 CAPSULES (300 MG TOTAL) BY MOUTH 2 (TWO) TIMES DAILY. 540 capsule 0 06/20/2018 at Unknown time  . lidocaine-prilocaine (EMLA) cream Apply to affected area once 30 g 3 Past Week at Unknown time  . LORazepam (ATIVAN) 0.5 MG tablet TAKE 1 TABLET (0.5 MG TOTAL) BY MOUTH EVERY 6 (SIX) HOURS AS NEEDED (NAUSEA OR VOMITING). 30 tablet 0 Past Week at Unknown time  . losartan (COZAAR) 100 MG tablet Take 100 mg by mouth daily.   06/20/2018 at Unknown time  . MAGNESIUM PO Take 400 mg by mouth at bedtime. Leg cramps   06/20/2018 at Unknown time  . Naproxen Sodium (ALEVE) 220 MG CAPS Take 220 mg by mouth 2 (two) times daily.    06/20/2018 at Unknown time  . ondansetron (ZOFRAN) 8 MG tablet Take 1 tablet (8 mg total) by mouth 2 (two) times daily as needed. Start on the third day after chemotherapy. 30 tablet 1 Past Week at Unknown time  . prochlorperazine (COMPAZINE) 10 MG tablet TAKE 1 TABLET (10 MG TOTAL) BY MOUTH EVERY 6 (SIX) HOURS AS NEEDED (NAUSEA OR VOMITING). 30 tablet 1 Past Week at Unknown time  . Pyridoxine HCl (VITAMIN B-6) 250 MG tablet Take 1 tablet (250 mg total) by mouth daily.   06/20/2018 at Unknown time  . senna (SENOKOT) 8.6 MG TABS tablet Take 1 tablet  (8.6 mg total) by mouth 2 (two) times daily. 30 each 0 Past Week at Unknown time  . amoxicillin (AMOXIL) 500 MG capsule Take 4 capsules 1 hour PRIOR to dental procedure (Patient not taking: Reported on 06/21/2018) 4 capsule 1 Not Taking at Unknown time  . polyethylene glycol (MIRALAX / GLYCOLAX) packet Take 17 g by mouth 2 (two) times daily. (Patient not taking: Reported on 06/21/2018) 20 each 0 Not Taking at Unknown time  Review of Systems  Constitutional: Positive for fever and malaise/fatigue. Negative for chills and weight loss.  Respiratory: Positive for cough and sputum production. Negative for hemoptysis and shortness of breath.   Cardiovascular: Negative.  Negative for chest pain, palpitations, claudication and leg swelling.  Gastrointestinal: Negative for abdominal pain, blood in stool, constipation, heartburn and vomiting.  Genitourinary: Negative for dysuria.  Musculoskeletal: Negative for joint pain and myalgias.  Neurological: Negative for dizziness, focal weakness and headaches.  Endo/Heme/Allergies: Does not bruise/bleed easily.  Psychiatric/Behavioral: Negative for depression. The patient is not nervous/anxious.   All other systems reviewed and are negative.   Physical Exam: Blood pressure (!) 110/49, pulse (!) 120, temperature 98.8 F (37.1 C), temperature source Oral, resp. rate (!) 21, height $RemoveBe'6\' 2"'wWajuVYQO$  (1.88 m), weight 120.2 kg, SpO2 97 %.  Physical Exam  Constitutional: He appears well-developed. No distress.  Mild obesity  HENT:  Head: Atraumatic.  Eyes: Conjunctivae are normal.  Neck: Neck supple. No JVD present. No thyromegaly present.  Cardiovascular: Normal heart sounds and intact distal pulses. An irregularly irregular rhythm present. Tachycardia present. Exam reveals no gallop.  No murmur heard. Pulmonary/Chest: Effort normal and breath sounds normal.  Abdominal: Soft. Bowel sounds are normal.  Musculoskeletal: Normal range of motion.        General: No edema.   Neurological: He is alert.  Skin: Skin is warm and dry.  Psychiatric: He has a normal mood and affect.    Labs:  BNP (last 3 results) No results for input(s): BNP in the last 8760 hours.  CMP Latest Ref Rng & Units 06/23/2018 06/22/2018 06/21/2018  Glucose 70 - 99 mg/dL 126(H) 158(H) 213(H)  BUN 8 - 23 mg/dL 36(H) 48(H) 58(H)  Creatinine 0.61 - 1.24 mg/dL 1.56(H) 1.56(H) 1.69(H)  Sodium 135 - 145 mmol/L 138 138 136  Potassium 3.5 - 5.1 mmol/L 3.0(L) 3.4(L) 4.1  Chloride 98 - 111 mmol/L 101 104 97(L)  CO2 22 - 32 mmol/L $RemoveB'26 23 26  'qIjLvsvR$ Calcium 8.9 - 10.3 mg/dL 8.1(L) 8.1(L) 9.0  Total Protein 6.5 - 8.1 g/dL 5.3(L) 5.1(L) 6.2(L)  Total Bilirubin 0.3 - 1.2 mg/dL 1.2 1.4(H) 1.8(H)  Alkaline Phos 38 - 126 U/L 35(L) 42 51  AST 15 - 41 U/L 14(L) 17 19  ALT 0 - 44 U/L $Remo'18 21 23     'IPpUl$ CBC Latest Ref Rng & Units 06/23/2018 06/22/2018 06/21/2018  WBC 4.0 - 10.5 K/uL 0.2(LL) 0.1(LL) 0.2(LL)  Hemoglobin 13.0 - 17.0 g/dL 9.6(L) 7.8(L) 10.7(L)  Hematocrit 39.0 - 52.0 % 29.8(L) 24.7(L) 32.1(L)  Platelets 150 - 400 K/uL 15(LL) 28(LL) 10(LL)      Lipid Panel     Component Value Date/Time   CHOL 163 10/11/2016 0749   TRIG 116 10/11/2016 0749   HDL 39 (L) 10/11/2016 0749   CHOLHDL 4.2 10/11/2016 0749   LDLCALC 101 (H) 10/11/2016 0749     BNP (last 3 results) No results for input(s): BNP in the last 8760 hours.   HEMOGLOBIN A1C Lab Results  Component Value Date   HGBA1C 5.6 10/11/2016    BMP Latest Ref Rng & Units 06/23/2018 06/22/2018 06/21/2018  Glucose 70 - 99 mg/dL 126(H) 158(H) 213(H)  BUN 8 - 23 mg/dL 36(H) 48(H) 58(H)  Creatinine 0.61 - 1.24 mg/dL 1.56(H) 1.56(H) 1.69(H)  Sodium 135 - 145 mmol/L 138 138 136  Potassium 3.5 - 5.1 mmol/L 3.0(L) 3.4(L) 4.1  Chloride 98 - 111 mmol/L 101 104 97(L)  CO2 22 - 32 mmol/L 26 23 26  Calcium 8.9 - 10.3 mg/dL 8.1(L) 8.1(L) 9.0     TSH No results for input(s): TSH in the last 8760 hours.   Radiology: Dg Chest Port 1 View  Result Date:  06/21/2018 CLINICAL DATA:  Fever and weakness EXAM: PORTABLE CHEST 1 VIEW COMPARISON:  None. FINDINGS: The heart size and mediastinal contours are within normal limits. Both lungs are clear. The visualized skeletal structures are unremarkable. There is a right chest wall power-injectable Port-A-Cath with tip in the lower SVC via a right internal jugular vein approach. IMPRESSION: No active disease. Electronically Signed   By: Ulyses Jarred M.D.   On: 06/21/2018 16:00   [place] Chest Soft Tissue  Result Date: 06/22/2018 CLINICAL DATA:  Palpable nodule near the right chest wall Port-A-Cath. This lies near the medial right clavicle. EXAM: ULTRASOUND OF CHEST SOFT TISSUES TECHNIQUE: Ultrasound examination of the chest wall soft tissues was performed in the area of clinical concern. COMPARISON:  None. FINDINGS: Start graphic assessment of the soft tissues adjacent to the indwelling anterior superior right chest wall Port-A-Cath was performed. Shadowing is seen from the Port-A-Cath. No soft tissue mass or fluid collection. No abnormal echogenicity. IMPRESSION: No soft tissue mass or fluid collection. Electronically Signed   By: Lajean Manes M.D.   On: 06/22/2018 16:59    Scheduled Meds: . sodium chloride   Intravenous Once  . amLODipine  10 mg Oral Daily  . chlorhexidine  15 mL Mouth/Throat QID  . Chlorhexidine Gluconate Cloth  6 each Topical Daily  . magic mouthwash w/lidocaine  5 mL Oral QID  . promethazine  12.5 mg Intravenous Once  . sodium bicarbonate/sodium chloride   Mouth Rinse Q4H  . sodium chloride flush  10-40 mL Intracatheter Q12H  . Tbo-Filgrastim  480 mcg Subcutaneous q1800   Continuous Infusions: . sodium chloride 75 mL/hr at 06/23/18 0856  .  ceFAZolin (ANCEF) IV 2 g (06/23/18 0858)  . potassium chloride     PRN Meds:.acetaminophen, guaiFENesin-dextromethorphan, metoprolol tartrate, sodium chloride flush  CARDIAC STUDIES:  EKG 06/23/2018 sinus tachycardia at the rate of 111 bpm,  normal axis, periodically no no evidence of ischemia.  And  EKG 06/21/2018: Normal CM A. fib with RVR, PVC, no evidence of ischemia.  Ventricular rate 140 bpm.  Echocardiogram 06/22/2018:   1. The left ventricle has normal systolic function, with an ejection fraction of 55-60%. The cavity size was normal. Left ventricular diastolic Doppler parameters are consistent with impaired relaxation.  2. The right ventricle has normal systolic function. The cavity was mildly enlarged. There is no increase in right ventricular wall thickness.  3. Right atrial size was mildly dilated.  4. Moderate thickening of the aortic valve Mild calcification of the aortic valve.  5. There is dilatation of the aortic root measuring 39 mm.  6. No vegetations detected.  ASSESSMENT AND PLAN: 1.  Paroxysmal atrial fibrillation with RVR CHA2DS2-VASc Score is 2.  -(CHF; HTN; vasc disease DM,  Male = 1; Age <65 =0; 65-74 = 1,  >75 =2; stroke = 2).  -(Yearly risk of stroke: Score of 1=1.3; 2=2.2; 3=3.2; 4=4; 5=6.7; 6=9.8; 7=>9.8)  2.  Essential hypertension 3.  Hyperglycemia 4.  Mild hyperlipidemia, minimally elevated LDL and mildly decreased HDL 5.  Pancytopenia 6.  Bladder cancer with metastasis 7.  Mild obesity  Recommendation: Patient is at most mild to moderate risk for cardioembolic phenomena.  However he has severe pancytopenia, severe anemia requiring blood transfusion, presented initially with hematuria which is resolved.  In  view of this he is not a candidate for anticoagulation.  Sepsis is probably unrelated to cardiac issues although aortic valve does show mild thickening.  Do not suspect he has endocarditis, however if he does not respond to usual care, TEE would be more appropriate but would wait on doing this so as not to complicate or introduce complications related to the hospital stay.  We will add metoprolol tartrate 25 mg p.o. twice daily along with diltiazem CD 120 mg p.o. daily, use of double  negative chronotropic agents will improve heart rate control.  He is relatively young and I do not worry about AV block.  He does have PVCs on telemetry and also ventricular couplets and ventricular triplets, however would treat this medically with a beta-blocker, in view of underlying medical comorbidity, would not pursue cardiac ischemic work-up for now at least.  I will be happy to be available if cardiac issues were to arise, if A. fib becomes rate uncontrolled with RVR, amiodarone 200 mg daily would be another choice.  Blood pressure is elevated, this gives [place] an opportunity to control his heart rate better as well.  He does have hyperglycemia and mild hyperlipidemia but will defer therapy of lipids for now.    Thank you for the consultation.  Adrian Prows, MD, [facility] 06/23/2018, 9:08 AM [facility] [place] Pager: [phone] Office: [phone] If no answer Cell [phone]

## 2018-06-23 NOTE — Progress Notes (Signed)
PROGRESS NOTE    [name] Rose  ZGY:[id number] DOB: 1950-09-30 DOA: 06/21/2018 PCP: Shirline Frees, MD   Brief Narrative:68 y.o.malewith medical history significant ofstage IV bladder cancer going through chemotherapy his last chemotherapy was 224 2028 days prior to admission to the hospital. Patient reports usually the second week after the chemotherapy he feels very weak but this is the first week after chemotherapy he has no appetite very decreased p.o. intake with lightheadedness and dizziness upon standing. He is also not able to eat due to ulcers in his mouth. Low-grade temp at home. He does complain of nausea but no vomiting diarrhea or constipation. Denies any cough shortness of breath. He reported hematuria at home has not urinated since coming to the ER.    ED Course:Patient received cefepime sodium 136 potassium 4.1 BUN 58 creatinine 1.69 anion gap is 13 magnesium 1.5 lipase 27 total bilirubin 1.8 lactic acid level was 3.1 white count was 0.2 hemoglobin 10.7 platelet count 10. EKG in the ER rapid A. fib   Assessment & Plan:   Active Problems:   Neutropenic fever (HCC)   Paroxysmal atrial fibrillation (HCC)   Mucositis due to chemotherapy   AKI (acute kidney injury) ([facility])   Pancytopenia (HCC)   MSSA bacteremia   Essential hypertension   Hyperglycemia   Mild hyperlipidemia  #1 neutropenic fever/sepsis in in the setting of f stage IV bladder cancer status post recent chemo.  Patient presented with hypotension tachycardia and fever and severe neutropenia and thrombocytopenia.  Blood culture with MSSA bacteremia.  DC vancomycin continue cefepime.  Transthoracic echo ejection fraction 55 to 60%.  Moderate thickening of the aortic valve probably mild calcification of the valve no vegetations detected.  TEE when more stable.  Sound at the port site and no evidence of fluid collection or abscess noted.  #2 pancytopenia secondary to recent chemo hemoglobin up to 9.6  after blood transfusion.  Platelet count 15,000 with no evidence of bleeding.  Transfuse for platelet count less than 10,000 or if there is any evidence of bleeding.  Patient receiving Granix.  Check CBC with differential tomorrow.  #3 mucositis we will treat with Magic mouthwash.  #4 hypotension patient's blood pressure is trending up.  Will restart home antihypertensives.   #5 new onset atrial fibrillation  patient again in rapid A. fib this morning.  He was asymptomatic.  Cardiology consulted placed him on Cardizem and beta-blocker.  Monitor closely.    Not a candidate for anticoagulation due to ongoing thrombocytopenia and anemia.  #6 AKI secondary to dehydration continue IV fluids with IV fluids.  #7 hypomagnesemia replete  DVT prophylaxis:SCD Code Status:Full code Family Communication:Discussed with wife in the room Disposition Plan:Pending clinical improvement     Estimated body mass index is 34.02 kg/m as calculated from the following:   Height as of this encounter: $RemoveBeforeD'6\' 2"'UHPuSPHOszDpjN$  (1.88 m).   Weight as of this encounter: 120.2 kg.   Subjective: Patient resting in bed he reports he is has not had anything to eat yesterday due to pain in the mouth.  No nausea vomiting diarrhea has not had a bowel movement since admission no chest pain no shortness of breath  Objective: Vitals:   06/23/18 0500 06/23/18 0600 06/23/18 0811 06/23/18 0900  BP: (!) 145/78 (!) 110/49  (!) 168/98  Pulse: (!) 104 (!) 120  71  Resp: (!) 25 (!) 21  (!) 27  Temp:   98.8 F (37.1 C)   TempSrc:   Oral  SpO2: 97% 97%  96%  Weight:      Height:        Intake/Output Summary (Last 24 hours) at 06/23/2018 1007 Last data filed at 06/23/2018 0913 Gross per 24 hour  Intake 2996.62 ml  Output 3225 ml  Net -228.38 ml   Filed Weights   06/21/18 1418  Weight: 120.2 kg    Examination:  General exam: Appears calm and comfortable  Respiratory system: Clear to auscultation. Respiratory effort normal.  Cardiovascular system: S1 & S2 heard, RRR. No JVD, murmurs, rubs, gallops or clicks. No pedal edema. Gastrointestinal system: Abdomen is nondistended, soft and nontender. No organomegaly or masses felt. Normal bowel sounds heard. Central nervous system: Alert and oriented. No focal neurological deficits. Extremities: Symmetric 5 x 5 power. Skin: No rashes, lesions or ulcers Psychiatry: Judgement and insight appear normal. Mood & affect appropriate.     Data Reviewed: I have personally reviewed following labs and imaging studies  CBC: Recent Labs  Lab 06/21/18 1438 06/22/18 0226 06/23/18 0232  WBC 0.2* 0.1* 0.2*  NEUTROABS 0.0*  --   --   HGB 10.7* 7.8* 9.6*  HCT 32.1* 24.7* 29.8*  MCV 94.4 98.0 93.1  PLT 10* 28* 15*   Basic Metabolic Panel: Recent Labs  Lab 06/21/18 1438 06/22/18 0226 06/23/18 0232  NA 136 138 138  K 4.1 3.4* 3.0*  CL 97* 104 101  CO2 $Re'26 23 26  'YDo$ GLUCOSE 213* 158* 126*  BUN 58* 48* 36*  CREATININE 1.69* 1.56* 1.56*  CALCIUM 9.0 8.1* 8.1*  MG 1.5* 1.5*  --    GFR: Estimated Creatinine Clearance: 64.2 mL/min (A) (by C-G formula based on SCr of 1.56 mg/dL (H)). Liver Function Tests: Recent Labs  Lab 06/21/18 1438 06/22/18 0226 06/23/18 0232  AST 19 17 14*  ALT $Re'23 21 18  'BWf$ ALKPHOS 51 42 35*  BILITOT 1.8* 1.4* 1.2  PROT 6.2* 5.1* 5.3*  ALBUMIN 3.6 2.9* 2.9*   Recent Labs  Lab 06/21/18 1438  LIPASE 27   No results for input(s): AMMONIA in the last 168 hours. Coagulation Profile: No results for input(s): INR, PROTIME in the last 168 hours. Cardiac Enzymes: Recent Labs  Lab 06/21/18 1438  TROPONINI <0.03   BNP (last 3 results) No results for input(s): PROBNP in the last 8760 hours. HbA1C: No results for input(s): HGBA1C in the last 72 hours. CBG: No results for input(s): GLUCAP in the last 168 hours. Lipid Profile: No results for input(s): CHOL, HDL, LDLCALC, TRIG, CHOLHDL, LDLDIRECT in the last 72 hours. Thyroid Function Tests: No  results for input(s): TSH, T4TOTAL, FREET4, T3FREE, THYROIDAB in the last 72 hours. Anemia Panel: No results for input(s): VITAMINB12, FOLATE, FERRITIN, TIBC, IRON, RETICCTPCT in the last 72 hours. Sepsis Labs: Recent Labs  Lab 06/21/18 1435 06/21/18 1717 06/21/18 1732 06/22/18 0226 06/22/18 1716 06/23/18 0232 06/23/18 0647  PROCALCITON  --   --  1.08 2.81  --  17.47  --   LATICACIDVEN 3.1* 2.5*  --   --  2.7*  --  1.8    Recent Results (from the past 240 hour(s))  Culture, blood (routine x 2)     Status: Abnormal (Preliminary result)   Collection Time: 06/21/18  2:13 PM  Result Value Ref Range Status   Specimen Description   Final    BLOOD RIGHT ANTECUBITAL Performed at [facility], [place] [address] [place], [place] [address]    Special Requests   Final    BOTTLES DRAWN  AEROBIC AND ANAEROBIC Blood Culture results may not be optimal due to an excessive volume of blood received in culture bottles Performed at [facility] [address], [place], [place] [address]    Culture  Setup Time   Final    GRAM POSITIVE COCCI Organism ID to follow CRITICAL RESULT CALLED TO, READ BACK BY AND VERIFIED WITH: PHRMD J GADHIA $Remove'@[email or web address]$  06/22/2018 BY S GEZAHEGN    Culture (A)  Final    STAPHYLOCOCCUS AUREUS SUSCEPTIBILITIES TO FOLLOW Performed at [facility], [place] [address] [place], [place] [address]    Report Status PENDING  Incomplete  Blood Culture ID Panel (Reflexed)     Status: Abnormal   Collection Time: 06/21/18  2:13 PM  Result Value Ref Range Status   Enterococcus species NOT DETECTED NOT DETECTED Final   Listeria monocytogenes NOT DETECTED NOT DETECTED Final   Staphylococcus species DETECTED (A) NOT DETECTED Final    Comment: CRITICAL RESULT CALLED TO, READ BACK BY AND VERIFIED WITH: PHRMD J GADHIA $Remove'@0846'eOykshr$  06/22/2018 BY S GEZAHEGN    Staphylococcus aureus (BCID) DETECTED (A) NOT DETECTED Final    Comment: Methicillin (oxacillin)  susceptible Staphylococcus aureus (MSSA). Preferred therapy is anti staphylococcal beta lactam antibiotic (Cefazolin or Nafcillin), unless clinically contraindicated. CRITICAL RESULT CALLED TO, READ BACK BY AND VERIFIED WITH: PHRMD J GADHIA $Remove'@0846'fnrpUKc$  06/22/2018 BY S GEZAHEGN    Methicillin resistance NOT DETECTED NOT DETECTED Final   Streptococcus species NOT DETECTED NOT DETECTED Final   Streptococcus agalactiae NOT DETECTED NOT DETECTED Final   Streptococcus pneumoniae NOT DETECTED NOT DETECTED Final   Streptococcus pyogenes NOT DETECTED NOT DETECTED Final   Acinetobacter baumannii NOT DETECTED NOT DETECTED Final   Enterobacteriaceae species NOT DETECTED NOT DETECTED Final   Enterobacter cloacae complex NOT DETECTED NOT DETECTED Final   Escherichia coli NOT DETECTED NOT DETECTED Final   Klebsiella oxytoca NOT DETECTED NOT DETECTED Final   Klebsiella pneumoniae NOT DETECTED NOT DETECTED Final   Proteus species NOT DETECTED NOT DETECTED Final   Serratia marcescens NOT DETECTED NOT DETECTED Final   Haemophilus influenzae NOT DETECTED NOT DETECTED Final   Neisseria meningitidis NOT DETECTED NOT DETECTED Final   Pseudomonas aeruginosa NOT DETECTED NOT DETECTED Final   Candida albicans NOT DETECTED NOT DETECTED Final   Candida glabrata NOT DETECTED NOT DETECTED Final   Candida krusei NOT DETECTED NOT DETECTED Final   Candida parapsilosis NOT DETECTED NOT DETECTED Final   Candida tropicalis NOT DETECTED NOT DETECTED Final    Comment: Performed at [facility], [place]. [address] [place], [place] [address]  Culture, blood (routine x 2)     Status: None (Preliminary result)   Collection Time: 06/21/18  3:22 PM  Result Value Ref Range Status   Specimen Description   Final    BLOOD RIGHT FOREARM Performed at [facility] [address], [place], [place] [address]    Special Requests   Final    BOTTLES DRAWN AEROBIC AND ANAEROBIC Blood Culture results may not be optimal due  to an excessive volume of blood received in culture bottles Performed at [facility] [address] [place], [place] [address]    Culture   Final    NO GROWTH < 24 HOURS Performed at [facility] [address], [place], [place] [address]    Report Status PENDING  Incomplete  MRSA PCR Screening     Status: None   Collection Time: 06/21/18  8:48  PM  Result Value Ref Range Status   MRSA by PCR NEGATIVE NEGATIVE Final    Comment:        The GeneXpert MRSA Assay (FDA approved for NASAL specimens only), is one component of a comprehensive MRSA colonization surveillance program. It is not intended to diagnose MRSA infection nor to guide or monitor treatment for MRSA infections. Performed at [facility], [place] [address], [place], [place] [address]          Radiology Studies: Dg Chest Port 1 View  Result Date: 06/23/2018 CLINICAL DATA:  Cough, shortness of breath. EXAM: PORTABLE CHEST 1 VIEW COMPARISON:  Radiograph of June 21, 2018. FINDINGS: Stable cardiomediastinal silhouette. Right internal jugular Port-A-Cath is unchanged in position. No pneumothorax or pleural effusion is noted. No acute pulmonary disease is noted. Bony thorax is unremarkable. IMPRESSION: No active disease. Electronically Signed   By: Marijo Conception, M.D.   On: 06/23/2018 09:12   Dg Chest Port 1 View  Result Date: 06/21/2018 CLINICAL DATA:  Fever and weakness EXAM: PORTABLE CHEST 1 VIEW COMPARISON:  None. FINDINGS: The heart size and mediastinal contours are within normal limits. Both lungs are clear. The visualized skeletal structures are unremarkable. There is a right chest wall power-injectable Port-A-Cath with tip in the lower SVC via a right internal jugular vein approach. IMPRESSION: No active disease. Electronically Signed   By: Ulyses Jarred M.D.   On: 06/21/2018 16:00   [place] Chest Soft Tissue  Result Date: 06/22/2018 CLINICAL DATA:  Palpable nodule near  the right chest wall Port-A-Cath. This lies near the medial right clavicle. EXAM: ULTRASOUND OF CHEST SOFT TISSUES TECHNIQUE: Ultrasound examination of the chest wall soft tissues was performed in the area of clinical concern. COMPARISON:  None. FINDINGS: Start graphic assessment of the soft tissues adjacent to the indwelling anterior superior right chest wall Port-A-Cath was performed. Shadowing is seen from the Port-A-Cath. No soft tissue mass or fluid collection. No abnormal echogenicity. IMPRESSION: No soft tissue mass or fluid collection. Electronically Signed   By: Lajean Manes M.D.   On: 06/22/2018 16:59        Scheduled Meds: . sodium chloride   Intravenous Once  . chlorhexidine  15 mL Mouth/Throat QID  . Chlorhexidine Gluconate Cloth  6 each Topical Daily  . diltiazem  240 mg Oral Daily  . magic mouthwash w/lidocaine  5 mL Oral QID  . metoprolol tartrate  25 mg Oral BID  . promethazine  12.5 mg Intravenous Once  . sodium bicarbonate/sodium chloride   Mouth Rinse Q4H  . sodium chloride flush  10-40 mL Intracatheter Q12H  . Tbo-Filgrastim  480 mcg Subcutaneous q1800   Continuous Infusions: . sodium chloride 75 mL/hr at 06/23/18 0856  . potassium chloride 10 mEq (06/23/18 0912)     LOS: 2 days     Georgette Shell, MD [facility]   If 7PM-7AM, please contact night-coverage [email or web address] Password [facility] 06/23/2018, 10:07 AM

## 2018-06-23 NOTE — Plan of Care (Signed)
Patient tolerated sitting in chair for 4 hours before going back to bed.  Patient reports decreased appetite after chemo treatments due to sore mouth. Will continue to monitor

## 2018-06-23 NOTE — Consult Note (Signed)
CARDIOLOGY CONSULT NOTE  Patient ID: [name] Rose MRN: [id number] DOB/AGE: February 25, 1951 68 y.o.  Admit date: 06/21/2018 Referring Physician  Jacki Cones, MD Primary Physician:  Shirline Frees, MD Reason for Consultation  A. Fib  HPI: [name] Rose  is a 68 y.o. male  With hypertension, prediabetes mellitus, obesity, metastatic bladder cancer presently undergoing chemotherapy, admitted to the hospital with failure to thrive, pancytopenia, sepsis and found to have MSSA bacteremia.  Patient developed paroxysmal episodes of atrial fibrillation with rapid ventricular response, consultation obtained.  Patient has chronic shortness of breath and dyspnea on exertion.  He also states that he has chronic cough and it is seasonal especially during spring.  He denies PND or orthopnea.  Denies leg edema.  He has never had any chest pain.  Past Medical History:  Diagnosis Date  . Arthritis   . 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 ([place]) 09/29/2016  . Bladder tumor   . Goals of care, counseling/discussion 09/30/2016  . History of prostate cancer followed by pcp dr [name]-  per pt last PSA undetectable   dx 2008-- (Stage T1c, Gleason 3+3,  PSA 4.58, vol 99cc)  s/p  radical prostatectomy (nerve sparing bilateral)   . Hypertension   . Lower urinary tract symptoms (LUTS)   . Pre-diabetes   . Wears glasses      Past Surgical History:  Procedure Laterality Date  . CATARACT EXTRACTION W/ INTRAOCULAR LENS  IMPLANT, BILATERAL Bilateral 2011  . [place]  . IR FLUORO GUIDE PORT INSERTION RIGHT  10/07/2016  . IR RADIOLOGIST EVAL & MGMT  01/05/2018  . IR US GUIDE VASC ACCESS RIGHT  10/07/2016  . KNEE ARTHROSCOPY Bilateral right 2006;  left 02-15-2007  . [place];  1990;  1983  . ROBOT ASSISTED LAPAROSCOPIC RADICAL PROSTATECTOMY  06/20/2006   bilateral nerve sparing  . TOTAL HIP ARTHROPLASTY Left 03/03/2015   Procedure: LEFT TOTAL HIP  ARTHROPLASTY ANTERIOR APPROACH;  Surgeon: Dorna Leitz, MD;  Location: [place];  Service: Orthopedics;  Laterality: Left;  . TOTAL KNEE ARTHROPLASTY Bilateral left 08-13-2009;  right 12-26-2009  . TRANSURETHRAL RESECTION OF BLADDER TUMOR N/A 09/20/2016   Procedure: TRANSURETHRAL RESECTION OF BLADDER TUMOR (TURBT);  Surgeon: Franchot Gallo, MD;  Location: [facility];  Service: Urology;  Laterality: N/A;     Family History  Problem Relation Age of Onset  . Hypertension Mother   . Aneurysm Mother   . Emphysema Father   . Hypertension Father      Social History: Social History   Socioeconomic History  . Marital status: Married    Spouse name: Not on file  . Number of children: Not on file  . Years of education: Not on file  . Highest education level: Not on file  Occupational History  . Not on file  Social Needs  . Financial resource strain: Not on file  . Food insecurity:    Worry: Not on file    Inability: Not on file  . Transportation needs:    Medical: Not on file    Non-medical: Not on file  Tobacco Use  . Smoking status: Former Smoker    Years: 16.00    Types: Cigarettes    Last attempt to quit: 09/25/1984    Years since quitting: 33.7  . Smokeless tobacco: Never Used  Substance and Sexual Activity  . Alcohol use: Yes    Comment: occasionally  . Drug use: No  . Sexual  activity: Not on file  Lifestyle  . Physical activity:    Days per week: Not on file    Minutes per session: Not on file  . Stress: Not on file  Relationships  . Social connections:    Talks on phone: Not on file    Gets together: Not on file    Attends religious service: Not on file    Active member of club or organization: Not on file    Attends meetings of clubs or organizations: Not on file    Relationship status: Not on file  . Intimate partner violence:    Fear of current or ex partner: Not on file    Emotionally abused: Not on file    Physically abused: Not on file     Forced sexual activity: Not on file  Other Topics Concern  . Not on file  Social History Narrative  . Not on file     Medications Prior to Admission  Medication Sig Dispense Refill Last Dose  . amLODipine (NORVASC) 10 MG tablet Take 10 mg by mouth daily.   06/20/2018 at Unknown time  . bisoprolol-hydrochlorothiazide (ZIAC) 10-6.25 MG tablet Take 1 tablet by mouth daily.   06/20/2018 at Unknown time  . Cinnamon 500 MG TABS Take 1 tablet by mouth 2 (two) times daily.    06/20/2018 at Unknown time  . dexamethasone (DECADRON) 4 MG tablet Take 2 tablets by mouth once a day on the day after chemotherapy and then take 2 tablets two times a day for 2 days. Take with food. 30 tablet 1 Past Week at Unknown time  . gabapentin (NEURONTIN) 100 MG capsule TAKE 3 CAPSULES (300 MG TOTAL) BY MOUTH 2 (TWO) TIMES DAILY. 540 capsule 0 06/20/2018 at Unknown time  . lidocaine-prilocaine (EMLA) cream Apply to affected area once 30 g 3 Past Week at Unknown time  . LORazepam (ATIVAN) 0.5 MG tablet TAKE 1 TABLET (0.5 MG TOTAL) BY MOUTH EVERY 6 (SIX) HOURS AS NEEDED (NAUSEA OR VOMITING). 30 tablet 0 Past Week at Unknown time  . losartan (COZAAR) 100 MG tablet Take 100 mg by mouth daily.   06/20/2018 at Unknown time  . MAGNESIUM PO Take 400 mg by mouth at bedtime. Leg cramps   06/20/2018 at Unknown time  . Naproxen Sodium (ALEVE) 220 MG CAPS Take 220 mg by mouth 2 (two) times daily.    06/20/2018 at Unknown time  . ondansetron (ZOFRAN) 8 MG tablet Take 1 tablet (8 mg total) by mouth 2 (two) times daily as needed. Start on the third day after chemotherapy. 30 tablet 1 Past Week at Unknown time  . prochlorperazine (COMPAZINE) 10 MG tablet TAKE 1 TABLET (10 MG TOTAL) BY MOUTH EVERY 6 (SIX) HOURS AS NEEDED (NAUSEA OR VOMITING). 30 tablet 1 Past Week at Unknown time  . Pyridoxine HCl (VITAMIN B-6) 250 MG tablet Take 1 tablet (250 mg total) by mouth daily.   06/20/2018 at Unknown time  . senna (SENOKOT) 8.6 MG TABS tablet Take 1 tablet  (8.6 mg total) by mouth 2 (two) times daily. 30 each 0 Past Week at Unknown time  . amoxicillin (AMOXIL) 500 MG capsule Take 4 capsules 1 hour PRIOR to dental procedure (Patient not taking: Reported on 06/21/2018) 4 capsule 1 Not Taking at Unknown time  . polyethylene glycol (MIRALAX / GLYCOLAX) packet Take 17 g by mouth 2 (two) times daily. (Patient not taking: Reported on 06/21/2018) 20 each 0 Not Taking at Unknown time  Review of Systems  Constitutional: Positive for fever and malaise/fatigue. Negative for chills and weight loss.  Respiratory: Positive for cough and sputum production. Negative for hemoptysis and shortness of breath.   Cardiovascular: Negative.  Negative for chest pain, palpitations, claudication and leg swelling.  Gastrointestinal: Negative for abdominal pain, blood in stool, constipation, heartburn and vomiting.  Genitourinary: Negative for dysuria.  Musculoskeletal: Negative for joint pain and myalgias.  Neurological: Negative for dizziness, focal weakness and headaches.  Endo/Heme/Allergies: Does not bruise/bleed easily.  Psychiatric/Behavioral: Negative for depression. The patient is not nervous/anxious.   All other systems reviewed and are negative.   Physical Exam: Blood pressure (!) 110/49, pulse (!) 120, temperature 98.8 F (37.1 C), temperature source Oral, resp. rate (!) 21, height $RemoveBe'6\' 2"'eULukCIoi$  (1.88 m), weight 120.2 kg, SpO2 97 %.  Physical Exam  Constitutional: He appears well-developed. No distress.  Mild obesity  HENT:  Head: Atraumatic.  Eyes: Conjunctivae are normal.  Neck: Neck supple. No JVD present. No thyromegaly present.  Cardiovascular: Normal heart sounds and intact distal pulses. An irregularly irregular rhythm present. Tachycardia present. Exam reveals no gallop.  No murmur heard. Pulmonary/Chest: Effort normal and breath sounds normal.  Abdominal: Soft. Bowel sounds are normal.  Musculoskeletal: Normal range of motion.        General: No edema.   Neurological: He is alert.  Skin: Skin is warm and dry.  Psychiatric: He has a normal mood and affect.    Labs:  BNP (last 3 results) No results for input(s): BNP in the last 8760 hours.  CMP Latest Ref Rng & Units 06/23/2018 06/22/2018 06/21/2018  Glucose 70 - 99 mg/dL 126(H) 158(H) 213(H)  BUN 8 - 23 mg/dL 36(H) 48(H) 58(H)  Creatinine 0.61 - 1.24 mg/dL 1.56(H) 1.56(H) 1.69(H)  Sodium 135 - 145 mmol/L 138 138 136  Potassium 3.5 - 5.1 mmol/L 3.0(L) 3.4(L) 4.1  Chloride 98 - 111 mmol/L 101 104 97(L)  CO2 22 - 32 mmol/L $RemoveB'26 23 26  'JrYEAnWW$ Calcium 8.9 - 10.3 mg/dL 8.1(L) 8.1(L) 9.0  Total Protein 6.5 - 8.1 g/dL 5.3(L) 5.1(L) 6.2(L)  Total Bilirubin 0.3 - 1.2 mg/dL 1.2 1.4(H) 1.8(H)  Alkaline Phos 38 - 126 U/L 35(L) 42 51  AST 15 - 41 U/L 14(L) 17 19  ALT 0 - 44 U/L $Remo'18 21 23     'mKWML$ CBC Latest Ref Rng & Units 06/23/2018 06/22/2018 06/21/2018  WBC 4.0 - 10.5 K/uL 0.2(LL) 0.1(LL) 0.2(LL)  Hemoglobin 13.0 - 17.0 g/dL 9.6(L) 7.8(L) 10.7(L)  Hematocrit 39.0 - 52.0 % 29.8(L) 24.7(L) 32.1(L)  Platelets 150 - 400 K/uL 15(LL) 28(LL) 10(LL)      Lipid Panel     Component Value Date/Time   CHOL 163 10/11/2016 0749   TRIG 116 10/11/2016 0749   HDL 39 (L) 10/11/2016 0749   CHOLHDL 4.2 10/11/2016 0749   LDLCALC 101 (H) 10/11/2016 0749     BNP (last 3 results) No results for input(s): BNP in the last 8760 hours.   HEMOGLOBIN A1C Lab Results  Component Value Date   HGBA1C 5.6 10/11/2016    BMP Latest Ref Rng & Units 06/23/2018 06/22/2018 06/21/2018  Glucose 70 - 99 mg/dL 126(H) 158(H) 213(H)  BUN 8 - 23 mg/dL 36(H) 48(H) 58(H)  Creatinine 0.61 - 1.24 mg/dL 1.56(H) 1.56(H) 1.69(H)  Sodium 135 - 145 mmol/L 138 138 136  Potassium 3.5 - 5.1 mmol/L 3.0(L) 3.4(L) 4.1  Chloride 98 - 111 mmol/L 101 104 97(L)  CO2 22 - 32 mmol/L 26 23 26  Calcium 8.9 - 10.3 mg/dL 8.1(L) 8.1(L) 9.0     TSH No results for input(s): TSH in the last 8760 hours.   Radiology: Dg Chest Port 1 View  Result Date:  06/21/2018 CLINICAL DATA:  Fever and weakness EXAM: PORTABLE CHEST 1 VIEW COMPARISON:  None. FINDINGS: The heart size and mediastinal contours are within normal limits. Both lungs are clear. The visualized skeletal structures are unremarkable. There is a right chest wall power-injectable Port-A-Cath with tip in the lower SVC via a right internal jugular vein approach. IMPRESSION: No active disease. Electronically Signed   By: Ulyses Jarred M.D.   On: 06/21/2018 16:00   [place] Chest Soft Tissue  Result Date: 06/22/2018 CLINICAL DATA:  Palpable nodule near the right chest wall Port-A-Cath. This lies near the medial right clavicle. EXAM: ULTRASOUND OF CHEST SOFT TISSUES TECHNIQUE: Ultrasound examination of the chest wall soft tissues was performed in the area of clinical concern. COMPARISON:  None. FINDINGS: Start graphic assessment of the soft tissues adjacent to the indwelling anterior superior right chest wall Port-A-Cath was performed. Shadowing is seen from the Port-A-Cath. No soft tissue mass or fluid collection. No abnormal echogenicity. IMPRESSION: No soft tissue mass or fluid collection. Electronically Signed   By: Lajean Manes M.D.   On: 06/22/2018 16:59    Scheduled Meds: . sodium chloride   Intravenous Once  . amLODipine  10 mg Oral Daily  . chlorhexidine  15 mL Mouth/Throat QID  . Chlorhexidine Gluconate Cloth  6 each Topical Daily  . magic mouthwash w/lidocaine  5 mL Oral QID  . promethazine  12.5 mg Intravenous Once  . sodium bicarbonate/sodium chloride   Mouth Rinse Q4H  . sodium chloride flush  10-40 mL Intracatheter Q12H  . Tbo-Filgrastim  480 mcg Subcutaneous q1800   Continuous Infusions: . sodium chloride 75 mL/hr at 06/23/18 0856  .  ceFAZolin (ANCEF) IV 2 g (06/23/18 0858)  . potassium chloride     PRN Meds:.acetaminophen, guaiFENesin-dextromethorphan, metoprolol tartrate, sodium chloride flush  CARDIAC STUDIES:  EKG 06/23/2018 sinus tachycardia at the rate of 111 bpm,  normal axis, periodically no no evidence of ischemia.  And  EKG 06/21/2018: Normal CM A. fib with RVR, PVC, no evidence of ischemia.  Ventricular rate 140 bpm.  Echocardiogram 06/22/2018:   1. The left ventricle has normal systolic function, with an ejection fraction of 55-60%. The cavity size was normal. Left ventricular diastolic Doppler parameters are consistent with impaired relaxation.  2. The right ventricle has normal systolic function. The cavity was mildly enlarged. There is no increase in right ventricular wall thickness.  3. Right atrial size was mildly dilated.  4. Moderate thickening of the aortic valve Mild calcification of the aortic valve.  5. There is dilatation of the aortic root measuring 39 mm.  6. No vegetations detected.  ASSESSMENT AND PLAN: 1.  Paroxysmal atrial fibrillation with RVR CHA2DS2-VASc Score is 2.  -(CHF; HTN; vasc disease DM,  Male = 1; Age <65 =0; 65-74 = 1,  >75 =2; stroke = 2).  -(Yearly risk of stroke: Score of 1=1.3; 2=2.2; 3=3.2; 4=4; 5=6.7; 6=9.8; 7=>9.8)  2.  Essential hypertension 3.  Hyperglycemia 4.  Mild hyperlipidemia, minimally elevated LDL and mildly decreased HDL 5.  Pancytopenia 6.  Bladder cancer with metastasis 7.  Mild obesity  Recommendation: Patient is at most mild to moderate risk for cardioembolic phenomena.  However he has severe pancytopenia, severe anemia requiring blood transfusion, presented initially with hematuria which is resolved.  In  view of this he is not a candidate for anticoagulation.  Sepsis is probably unrelated to cardiac issues although aortic valve does show mild thickening.  Do not suspect he has endocarditis, however if he does not respond to usual care, TEE would be more appropriate but would wait on doing this so as not to complicate or introduce complications related to the hospital stay.  We will add metoprolol tartrate 25 mg p.o. twice daily along with diltiazem CD 120 mg p.o. daily, use of double  negative chronotropic agents will improve heart rate control.  He is relatively young and I do not worry about AV block.  He does have PVCs on telemetry and also ventricular couplets and ventricular triplets, however would treat this medically with a beta-blocker, in view of underlying medical comorbidity, would not pursue cardiac ischemic work-up for now at least.  I will be happy to be available if cardiac issues were to arise, if A. fib becomes rate uncontrolled with RVR, amiodarone 200 mg daily would be another choice.  Blood pressure is elevated, this gives [place] an opportunity to control his heart rate better as well.  He does have hyperglycemia and mild hyperlipidemia but will defer therapy of lipids for now.    Thank you for the consultation.  Adrian Prows, MD, [facility] 06/23/2018, 9:08 AM [facility]. [place] Pager: [phone] Office: [phone] If no answer Cell [phone]

## 2018-06-23 NOTE — Progress Notes (Signed)
Pharmacy Antibiotic Note  [name] Rose is a 68 y.o. male with a h/o stage IV bladder cancer admitted on 06/21/2018 with generalized weakness.  Bcx + MSSA.  Pharmacy had been dosing Cefazolin per ID, but per Dr. Antonieta Pert request, increase coverage to Cefepime d/t neutropenia.  06/23/2018: - Renal function trending to baseline, CrCl~ 64 ml/min - Tmax improved to 100.4 (from 102.2) - WBC 0.2, Granix started 06/22/18 - LA 3.1 > 1.8  Plan:  Cefepime 2gm IV q8h  Follow up renal fxn, culture results, and clinical course.  F/u ability to de-escalate antibiotics.   Height: $Remove'6\' 2"'nCUyOrY$  (188 cm) Weight: 265 lb (120.2 kg) IBW/kg (Calculated) : 82.2  Temp (24hrs), Avg:99.1 F (37.3 C), Min:98.4 F (36.9 C), Max:100.4 F (38 C)  Recent Labs  Lab 06/21/18 1435 06/21/18 1438 06/21/18 1717 06/22/18 0226 06/22/18 1716 06/23/18 0232 06/23/18 0647  WBC  --  0.2*  --  0.1*  --  0.2*  --   CREATININE  --  1.69*  --  1.56*  --  1.56*  --   LATICACIDVEN 3.1*  --  2.5*  --  2.7*  --  1.8    Estimated Creatinine Clearance: 64.2 mL/min (A) (by C-G formula based on SCr of 1.56 mg/dL (H)).    No Known Allergies  Antimicrobials this admission: 3/11 cefepime >> 3/12 3/11 vanc >> 3/12 3/12 Ancef>> 3/13 3/13 Cefepime >>    Dose adjustments this admission:   Microbiology results: 3/11 BCx: Staph aureus      3/12 BCID:  MSSA  3/11 UCx:   3/11 MRSA PCR: neg   Thank you for allowing pharmacy to be a part of this patient's care.  Gretta Arab PharmD, BCPS Pager [phone] 06/23/2018 10:07 AM

## 2018-06-23 NOTE — Progress Notes (Signed)
[name] Rose is back in rapid atrial fibrillation this morning.  His heart rate is about 130.  He does seem little bit more uncomfortable.  He does have a little more tachypnea.  He did get 2 units of blood yesterday.  This morning, his white count 0.2.  Hemoglobin 9.6.  Platelet count 15,000.  He is not bleeding.  I would not give him platelets.  He is getting Neupogen for the neutropenic condition.  He is growing MSSA in his blood.  He did have a echocardiogram yesterday which did not show any obvious vegetations.  He is on Ancef.  Given the fact that he is still neutropenic, I think we need broader spectrum of coverage.  I understand that the Ancef is great for the MSSA.  I want to make sure that nothing else happens while he is neutropenic.  I would probably get him on Primaxin or Merrem right now.  Again, I just think that we need to have broaden coverage while he is neutropenic.  His potassium is only 3.  I will get a chest x-ray on him to see if there is any evidence of volume overload.  Maybe, a nebulizer will help with his breathing.  His mouth is still little bit sore.  He is not eating much at all.  He has had no diarrhea.  Currently, his temperature is 98.6.  Pulse is 130.  Blood pressure 110/49.  His oral exam does show some erythema in the soft palate.  There is no obvious mucositis.  Lungs show decent breath sounds bilaterally.  He does have some wheezes bilaterally.  There is some rales more so on the right side than left side.  Card exam is tachycardic and irregular.  Abdomen is soft.  Bowel sounds are present.  He has no fluid wave.  Extremities shows no clubbing, cyanosis or edema.  We will see what a chest x-ray shows.  If there is volume overload, he will definitely need some Lasix.  Again, since he is still neutropenic, I would have him on broad coverage antibiotics.  Given that this is MSSA, I do not think we need to have the Port-A-Cath taken out.  His temperature  spike will need to be followed.  I know he is getting fantastic care from everybody down in the ICU.  I really appreciate all their help.  [name] Haw, MD  [name] 2:8

## 2018-06-24 DIAGNOSIS — R7881 Bacteremia: Secondary | ICD-10-CM

## 2018-06-24 LAB — CBC WITH DIFFERENTIAL/PLATELET
HCT: 28 % — ABNORMAL LOW (ref 39.0–52.0)
Hemoglobin: 9.3 g/dL — ABNORMAL LOW (ref 13.0–17.0)
MCH: 30.9 pg (ref 26.0–34.0)
MCHC: 33.2 g/dL (ref 30.0–36.0)
MCV: 93 fL (ref 80.0–100.0)
Platelets: 6 10*3/uL — CL (ref 150–400)
RBC: 3.01 MIL/uL — ABNORMAL LOW (ref 4.22–5.81)
RDW: 14.7 % (ref 11.5–15.5)
WBC: 0.4 10*3/uL — CL (ref 4.0–10.5)
nRBC: 0 % (ref 0.0–0.2)

## 2018-06-24 LAB — COMPREHENSIVE METABOLIC PANEL
ALT: 10 U/L (ref 0–44)
AST: 16 U/L (ref 15–41)
Albumin: 2.5 g/dL — ABNORMAL LOW (ref 3.5–5.0)
Alkaline Phosphatase: 31 U/L — ABNORMAL LOW (ref 38–126)
Anion gap: 13 (ref 5–15)
BUN: 43 mg/dL — ABNORMAL HIGH (ref 8–23)
CO2: 23 mmol/L (ref 22–32)
Calcium: 8.2 mg/dL — ABNORMAL LOW (ref 8.9–10.3)
Chloride: 100 mmol/L (ref 98–111)
Creatinine, Ser: 1.67 mg/dL — ABNORMAL HIGH (ref 0.61–1.24)
GFR calc Af Amer: 49 mL/min — ABNORMAL LOW (ref >60)
GFR calc non Af Amer: 42 mL/min — ABNORMAL LOW (ref >60)
Glucose, Bld: 120 mg/dL — ABNORMAL HIGH (ref 70–99)
Potassium: 2.6 mmol/L — CL (ref 3.5–5.1)
Sodium: 136 mmol/L (ref 135–145)
Total Bilirubin: 1.3 mg/dL — ABNORMAL HIGH (ref 0.3–1.2)
Total Protein: 5 g/dL — ABNORMAL LOW (ref 6.5–8.1)

## 2018-06-24 LAB — CULTURE, BLOOD (ROUTINE X 2)

## 2018-06-24 LAB — GLUCOSE, CAPILLARY: Glucose-Capillary: 109 mg/dL — ABNORMAL HIGH (ref 70–99)

## 2018-06-24 LAB — BASIC METABOLIC PANEL
Anion gap: 8 (ref 5–15)
BUN: 46 mg/dL — ABNORMAL HIGH (ref 8–23)
CO2: 25 mmol/L (ref 22–32)
Calcium: 7.8 mg/dL — ABNORMAL LOW (ref 8.9–10.3)
Chloride: 102 mmol/L (ref 98–111)
Creatinine, Ser: 1.61 mg/dL — ABNORMAL HIGH (ref 0.61–1.24)
GFR calc Af Amer: 51 mL/min — ABNORMAL LOW (ref >60)
GFR calc non Af Amer: 44 mL/min — ABNORMAL LOW (ref >60)
Glucose, Bld: 112 mg/dL — ABNORMAL HIGH (ref 70–99)
Potassium: 2.9 mmol/L — ABNORMAL LOW (ref 3.5–5.1)
Sodium: 135 mmol/L (ref 135–145)

## 2018-06-24 LAB — MAGNESIUM: Magnesium: 1.2 mg/dL — ABNORMAL LOW (ref 1.7–2.4)

## 2018-06-24 LAB — PHOSPHORUS: Phosphorus: 1.9 mg/dL — ABNORMAL LOW (ref 2.5–4.6)

## 2018-06-24 MED ORDER — ENSURE ENLIVE PO LIQD
237.0000 mL | Freq: Three times a day (TID) | ORAL | Status: DC
  Administered 2018-06-25: 237 mL via ORAL

## 2018-06-24 MED ORDER — MAGNESIUM SULFATE 2 GM/50ML IV SOLN
2.0000 g | Freq: Once | INTRAVENOUS | Status: AC
  Administered 2018-06-24: 2 g via INTRAVENOUS
  Filled 2018-06-24: qty 50

## 2018-06-24 MED ORDER — POTASSIUM CHLORIDE 20 MEQ PO PACK
20.0000 meq | PACK | Freq: Once | ORAL | Status: AC
  Administered 2018-06-24: 20 meq via ORAL
  Filled 2018-06-24: qty 1

## 2018-06-24 MED ORDER — POTASSIUM CHLORIDE 10 MEQ/50ML IV SOLN
10.0000 meq | INTRAVENOUS | Status: AC
  Administered 2018-06-24 – 2018-06-25 (×3): 10 meq via INTRAVENOUS
  Filled 2018-06-24 (×3): qty 50

## 2018-06-24 MED ORDER — POTASSIUM CHLORIDE CRYS ER 20 MEQ PO TBCR
40.0000 meq | EXTENDED_RELEASE_TABLET | Freq: Once | ORAL | Status: AC
  Administered 2018-06-24: 40 meq via ORAL
  Filled 2018-06-24: qty 2

## 2018-06-24 MED ORDER — POTASSIUM CHLORIDE 10 MEQ/100ML IV SOLN
10.0000 meq | INTRAVENOUS | Status: AC
  Administered 2018-06-24 (×4): 10 meq via INTRAVENOUS
  Filled 2018-06-24 (×3): qty 100

## 2018-06-24 MED ORDER — SODIUM CHLORIDE 0.9 % IV SOLN
INTRAVENOUS | Status: DC
  Administered 2018-06-24: 06:00:00 via INTRAVENOUS
  Administered 2018-06-24: 1000 mL via INTRAVENOUS
  Administered 2018-06-25 (×2): via INTRAVENOUS

## 2018-06-24 MED ORDER — POTASSIUM CHLORIDE 10 MEQ/100ML IV SOLN: 10.0000 meq | INTRAVENOUS | Status: DC

## 2018-06-24 NOTE — Progress Notes (Signed)
Patient refused bath at this time. Stated he would let us know when he was ready. RN notified

## 2018-06-24 NOTE — Progress Notes (Signed)
CRITICAL VALUE ALERT  Critical Value:  K=2.6  Date & Time Notied:  06/24/2018 $RemoveBe'@0525'waevyNWbH$   Provider Notified: Dr. Philis Pique   Orders Received/Actions taken: awaiting orders

## 2018-06-24 NOTE — Progress Notes (Signed)
Patient refused to allow nursing staff to assist him to get cleaned up after having an incontinent episode of loose stool.  Patient was asked multiple time to allow this RN to assist him in the bathroom.  HR increased to 170 sats 88% while attempting to do self care.  Patient assisted out of bathroom to bed, + shortness of breath, in which recovered quickly once sitting down in bed.  Discussed the importance of allowing nursing staff to assist.  Will continue to monitor closely

## 2018-06-24 NOTE — Progress Notes (Signed)
PROGRESS NOTE    [name] Rose  QVZ:[id number] DOB: [birth year] DOA: 06/21/2018 PCP: Shirline Frees, MD  Brief Narrative:67 y.o.malewith medical history significant ofstage IV bladder cancer going through chemotherapy his last chemotherapy was 224 2028 days prior to admission to the hospital. Patient reports usually the second week after the chemotherapy he feels very weak but this is the first week after chemotherapy he has no appetite very decreased p.o. intake with lightheadedness and dizziness upon standing. He is also not able to eat due to ulcers in his mouth. Low-grade temp at home. He does complain of nausea but no vomiting diarrhea or constipation. Denies any cough shortness of breath. He reported hematuria at home has not urinated since coming to the ER.    ED Course:Patient received cefepime sodium 136 potassium 4.1 BUN 58 creatinine 1.69 anion gap is 13 magnesium 1.5 lipase 27 total bilirubin 1.8 lactic acid level was 3.1 white count was 0.2 hemoglobin 10.7 platelet count 10. EKG in the ER rapid A. fib   Assessment & Plan:   Active Problems:   Neutropenic fever (HCC)   New onset a-fib (HCC)   Mucositis due to chemotherapy   AKI (acute kidney injury) ([place])   Pancytopenia (HCC)   MSSA bacteremia   Essential hypertension   Hyperglycemia   Mild hyperlipidemia   #1 neutropenic fever/sepsis inin the setting of stage IV bladder cancer status post recent chemo.Patient presented with hypotension tachycardia and fever and severe neutropenia and thrombocytopenia. Blood culture with MSSA bacteremia. DC vancomycin continue cefepime.  Transthoracic echo ejection fraction 55 to 60%.  Moderate thickening of the aortic valve probably mild calcification of the valve no vegetations detected.  TEE when more stable.  Ultra Sound at the port site and no evidence of fluid collection or abscess noted.  WBC slowly increasing or improving.  #2 pancytopenia secondary to recent  chemo hemoglobin up to 9.6 after blood transfusion.  Platelet count 15,000 with no evidence of bleeding.  Transfuse for platelet count less than 10,000 or if there is any evidence of bleeding.  Patient receiving Granix.  Check CBC with differential tomorrow.  #3 mucositis we will treat with Magic mouthwash.  #4 hypotensionpatient's blood pressure is trending up. Will restart home antihypertensives.   #5 new onset atrial fibrillation patient again in rapid A. fib -rate controlled on Cardizem and and beta-blocker.  Electrolytes being repleted.  Monitor closely.  Not a candidate for anticoagulation due to ongoing thrombocytopenia and anemia.  #6 AKI secondary to dehydration continue IV fluidswith IV fluids.  #7 hypomagnesemia/hypokalemia replete check phosphorus level  DVT prophylaxis:SCD Code Status:Full code Family Communication:Wife not available Disposition Plan:Pending clinical improvement  Estimated body mass index is 34.02 kg/m as calculated from the following:   Height as of this encounter: $RemoveBeforeD'6\' 2"'oMNPWgXshBhOwE$  (1.88 m).   Weight as of this encounter: 120.2 kg.    Subjective: Sitting in bed complains of a dry cough from seasonal allergies appetite still very diminished but agreed to drink Ensure has not had a bowel movement has had some nausea but no vomiting  Objective: Vitals:   06/24/18 0600 06/24/18 0700 06/24/18 0818 06/24/18 1019  BP: (!) 148/70   133/72  Pulse: 99   96  Resp: (!) 26     Temp:   98.9 F (37.2 C)   TempSrc:  Oral    SpO2: 93%     Weight:      Height:        Intake/Output Summary (Last 24 hours)  at 06/24/2018 1235 Last data filed at 06/24/2018 0500 Gross per 24 hour  Intake 1890.06 ml  Output 550 ml  Net 1340.06 ml   Filed Weights   06/21/18 1418  Weight: 120.2 kg    Examination:  General exam: Appears calm and comfortable  Respiratory system: Clear to auscultation. Respiratory effort normal. Cardiovascular system: S1 & S2 heard, RRR.  No JVD, murmurs, rubs, gallops or clicks. No pedal edema. Gastrointestinal system: Abdomen is nondistended, soft and nontender. No organomegaly or masses felt. Normal bowel sounds heard. Central nervous system: Alert and oriented. No focal neurological deficits. Extremities: Symmetric 5 x 5 power. Skin: No rashes, lesions or ulcers Psychiatry: Judgement and insight appear normal. Mood & affect appropriate.     Data Reviewed: I have personally reviewed following labs and imaging studies  CBC: Recent Labs  Lab 06/21/18 1438 06/22/18 0226 06/23/18 0232 06/24/18 0423  WBC 0.2* 0.1* 0.2* 0.4*  NEUTROABS 0.0*  --   --  TOO FEW TO COUNT, SMEAR AVAILABLE FOR REVIEW  HGB 10.7* 7.8* 9.6* 9.3*  HCT 32.1* 24.7* 29.8* 28.0*  MCV 94.4 98.0 93.1 93.0  PLT 10* 28* 15* 6*   Basic Metabolic Panel: Recent Labs  Lab 06/21/18 1438 06/22/18 0226 06/23/18 0232 06/24/18 0423  NA 136 138 138 136  K 4.1 3.4* 3.0* 2.6*  CL 97* 104 101 100  CO2 $Re'26 23 26 23  'HLN$ GLUCOSE 213* 158* 126* 120*  BUN 58* 48* 36* 43*  CREATININE 1.69* 1.56* 1.56* 1.67*  CALCIUM 9.0 8.1* 8.1* 8.2*  MG 1.5* 1.5*  --  1.2*   GFR: Estimated Creatinine Clearance: 59.9 mL/min (A) (by C-G formula based on SCr of 1.67 mg/dL (H)). Liver Function Tests: Recent Labs  Lab 06/21/18 1438 06/22/18 0226 06/23/18 0232 06/24/18 0423  AST 19 17 14* 16  ALT $Re'23 21 18 10  'SBx$ ALKPHOS 51 42 35* 31*  BILITOT 1.8* 1.4* 1.2 1.3*  PROT 6.2* 5.1* 5.3* 5.0*  ALBUMIN 3.6 2.9* 2.9* 2.5*   Recent Labs  Lab 06/21/18 1438  LIPASE 27   No results for input(s): AMMONIA in the last 168 hours. Coagulation Profile: No results for input(s): INR, PROTIME in the last 168 hours. Cardiac Enzymes: Recent Labs  Lab 06/21/18 1438  TROPONINI <0.03   BNP (last 3 results) No results for input(s): PROBNP in the last 8760 hours. HbA1C: No results for input(s): HGBA1C in the last 72 hours. CBG: Recent Labs  Lab 06/24/18 0434  GLUCAP 109*   Lipid  Profile: No results for input(s): CHOL, HDL, LDLCALC, TRIG, CHOLHDL, LDLDIRECT in the last 72 hours. Thyroid Function Tests: Recent Labs    06/23/18 0928  TSH 0.455   Anemia Panel: No results for input(s): VITAMINB12, FOLATE, FERRITIN, TIBC, IRON, RETICCTPCT in the last 72 hours. Sepsis Labs: Recent Labs  Lab 06/21/18 1435 06/21/18 1717 06/21/18 1732 06/22/18 0226 06/22/18 1716 06/23/18 0232 06/23/18 0647  PROCALCITON  --   --  1.08 2.81  --  17.47  --   LATICACIDVEN 3.1* 2.5*  --   --  2.7*  --  1.8    Recent Results (from the past 240 hour(s))  Culture, blood (routine x 2)     Status: Abnormal   Collection Time: 06/21/18  2:13 PM  Result Value Ref Range Status   Specimen Description   Final    BLOOD RIGHT ANTECUBITAL Performed at [facility], [place] [address] [place], [place] [address]    Special Requests  Final    BOTTLES DRAWN AEROBIC AND ANAEROBIC Blood Culture results may not be optimal due to an excessive volume of blood received in culture bottles Performed at [facility] [address], [place], [place] [address]    Culture  Setup Time   Final    GRAM POSITIVE COCCI CRITICAL RESULT CALLED TO, READ BACK BY AND VERIFIED WITH: PHRMD J GADHIA $Remove'@0846'[email or web address]$  06/22/2018 BY S GEZAHEGN Performed at [facility], [place] [address], [place], [place] [address]    Culture STAPHYLOCOCCUS AUREUS (A)  Final   Report Status 06/24/2018 FINAL  Final   Organism ID, Bacteria STAPHYLOCOCCUS AUREUS  Final      Susceptibility   Staphylococcus aureus - MIC*    CIPROFLOXACIN <=0.5 SENSITIVE Sensitive     ERYTHROMYCIN <=0.25 SENSITIVE Sensitive     GENTAMICIN <=0.5 SENSITIVE Sensitive     OXACILLIN <=0.25 SENSITIVE Sensitive     TETRACYCLINE <=1 SENSITIVE Sensitive     VANCOMYCIN <=0.5 SENSITIVE Sensitive     TRIMETH/SULFA <=10 SENSITIVE Sensitive     CLINDAMYCIN <=0.25 SENSITIVE Sensitive     RIFAMPIN <=0.5 SENSITIVE Sensitive     Inducible  Clindamycin NEGATIVE Sensitive     * STAPHYLOCOCCUS AUREUS  Urine culture     Status: Abnormal   Collection Time: 06/21/18  2:13 PM  Result Value Ref Range Status   Specimen Description   Final    URINE, RANDOM Performed at [facility] [address], [place], [place] [address]    Special Requests   Final    NONE Performed at [facility], [place] [address], [place], [place] [address]    Culture (A)  Final    10,000 COLONIES/mL DIPHTHEROIDS(CORYNEBACTERIUM SPECIES) Standardized susceptibility testing for this organism is not available. Performed at [facility], [place] [address] [place], [place] [address]    Report Status 06/23/2018 FINAL  Final  Blood Culture ID Panel (Reflexed)     Status: Abnormal   Collection Time: 06/21/18  2:13 PM  Result Value Ref Range Status   Enterococcus species NOT DETECTED NOT DETECTED Final   Listeria monocytogenes NOT DETECTED NOT DETECTED Final   Staphylococcus species DETECTED (A) NOT DETECTED Final    Comment: CRITICAL RESULT CALLED TO, READ BACK BY AND VERIFIED WITH: PHRMD J GADHIA $Remove'@0846'[email or web address]$  06/22/2018 BY S GEZAHEGN    Staphylococcus aureus (BCID) DETECTED (A) NOT DETECTED Final    Comment: Methicillin (oxacillin) susceptible Staphylococcus aureus (MSSA). Preferred therapy is anti staphylococcal beta lactam antibiotic (Cefazolin or Nafcillin), unless clinically contraindicated. CRITICAL RESULT CALLED TO, READ BACK BY AND VERIFIED WITH: PHRMD J GADHIA $Remove'@0846'[id number]$  06/22/2018 BY S GEZAHEGN    Methicillin resistance NOT DETECTED NOT DETECTED Final   Streptococcus species NOT DETECTED NOT DETECTED Final   Streptococcus agalactiae NOT DETECTED NOT DETECTED Final   Streptococcus pneumoniae NOT DETECTED NOT DETECTED Final   Streptococcus pyogenes NOT DETECTED NOT DETECTED Final   Acinetobacter baumannii NOT DETECTED NOT DETECTED Final   Enterobacteriaceae species NOT DETECTED NOT DETECTED Final   Enterobacter cloacae  complex NOT DETECTED NOT DETECTED Final   Escherichia coli NOT DETECTED NOT DETECTED Final   Klebsiella oxytoca NOT DETECTED NOT DETECTED Final   Klebsiella pneumoniae NOT DETECTED NOT DETECTED Final   Proteus species NOT DETECTED NOT DETECTED Final   Serratia marcescens NOT DETECTED NOT DETECTED Final   Haemophilus influenzae NOT DETECTED NOT DETECTED Final   Neisseria meningitidis NOT DETECTED NOT DETECTED Final   Pseudomonas aeruginosa  NOT DETECTED NOT DETECTED Final   Candida albicans NOT DETECTED NOT DETECTED Final   Candida glabrata NOT DETECTED NOT DETECTED Final   Candida krusei NOT DETECTED NOT DETECTED Final   Candida parapsilosis NOT DETECTED NOT DETECTED Final   Candida tropicalis NOT DETECTED NOT DETECTED Final    Comment: Performed at [facility], [place] [address] [place], [place] [address]  Culture, blood (routine x 2)     Status: None (Preliminary result)   Collection Time: 06/21/18  3:22 PM  Result Value Ref Range Status   Specimen Description   Final    BLOOD RIGHT FOREARM Performed at [facility] [address], [place], [place] [address]    Special Requests   Final    BOTTLES DRAWN AEROBIC AND ANAEROBIC Blood Culture results may not be optimal due to an excessive volume of blood received in culture bottles Performed at [facility] [address] [place], [place] [address]    Culture   Final    NO GROWTH 2 DAYS Performed at [facility] [address], [place], [place] [address]    Report Status PENDING  Incomplete  MRSA PCR Screening     Status: None   Collection Time: 06/21/18  8:48 PM  Result Value Ref Range Status   MRSA by PCR NEGATIVE NEGATIVE Final    Comment:        The GeneXpert MRSA Assay (FDA approved for NASAL specimens only), is one component of a comprehensive MRSA colonization surveillance program. It is not intended to diagnose MRSA infection nor to guide or monitor treatment for  MRSA infections. Performed at [facility], [place] [address] [place], [place] [address]          Radiology Studies: Dg Chest Port 1 View  Result Date: 06/23/2018 CLINICAL DATA:  Cough, shortness of breath. EXAM: PORTABLE CHEST 1 VIEW COMPARISON:  Radiograph of June 21, 2018. FINDINGS: Stable cardiomediastinal silhouette. Right internal jugular Port-A-Cath is unchanged in position. No pneumothorax or pleural effusion is noted. No acute pulmonary disease is noted. Bony thorax is unremarkable. IMPRESSION: No active disease. Electronically Signed   By: Marijo Conception, M.D.   On: 06/23/2018 09:12   [place] Chest Soft Tissue  Result Date: 06/22/2018 CLINICAL DATA:  Palpable nodule near the right chest wall Port-A-Cath. This lies near the medial right clavicle. EXAM: ULTRASOUND OF CHEST SOFT TISSUES TECHNIQUE: Ultrasound examination of the chest wall soft tissues was performed in the area of clinical concern. COMPARISON:  None. FINDINGS: Start graphic assessment of the soft tissues adjacent to the indwelling anterior superior right chest wall Port-A-Cath was performed. Shadowing is seen from the Port-A-Cath. No soft tissue mass or fluid collection. No abnormal echogenicity. IMPRESSION: No soft tissue mass or fluid collection. Electronically Signed   By: Lajean Manes M.D.   On: 06/22/2018 16:59        Scheduled Meds: . sodium chloride   Intravenous Once  . bisacodyl  10 mg Oral Daily  . chlorhexidine  15 mL Mouth/Throat QID  . Chlorhexidine Gluconate Cloth  6 each Topical Daily  . diltiazem  240 mg Oral Daily  . feeding supplement (ENSURE ENLIVE)  237 mL Oral TID BM  . loratadine  10 mg Oral Daily  . magic mouthwash w/lidocaine  5 mL Oral QID  . metoprolol tartrate  25 mg Oral BID  . promethazine  12.5 mg Intravenous Once  . sodium bicarbonate/sodium chloride  Mouth Rinse Q4H  . sodium chloride flush  10-40 mL Intracatheter Q12H  . Tbo-Filgrastim  480 mcg Subcutaneous  q1800   Continuous Infusions: . sodium chloride 75 mL/hr at 06/24/18 0500  . sodium chloride Stopped (06/24/18 0616)  . ceFEPime (MAXIPIME) IV Stopped (06/24/18 3570)  . [COMPLETED] potassium chloride 10 mEq (06/24/18 1201)     LOS: 3 days     Georgette Shell, MD [facility]   If 7PM-7AM, please contact night-coverage [email or web address] Password [facility] 06/24/2018, 12:35 PM

## 2018-06-25 DIAGNOSIS — N179 Acute kidney failure, unspecified: Secondary | ICD-10-CM

## 2018-06-25 DIAGNOSIS — E860 Dehydration: Secondary | ICD-10-CM

## 2018-06-25 LAB — CBC WITH DIFFERENTIAL/PLATELET
HCT: 24.3 % — ABNORMAL LOW (ref 39.0–52.0)
Hemoglobin: 7.8 g/dL — ABNORMAL LOW (ref 13.0–17.0)
MCH: 30.7 pg (ref 26.0–34.0)
MCHC: 32.1 g/dL (ref 30.0–36.0)
MCV: 95.7 fL (ref 80.0–100.0)
Platelets: <5 10*3/uL — CL (ref 150–400)
RBC: 2.54 MIL/uL — ABNORMAL LOW (ref 4.22–5.81)
RDW: 14.4 % (ref 11.5–15.5)
WBC: 1 10*3/uL — CL (ref 4.0–10.5)
nRBC: 0 % (ref 0.0–0.2)

## 2018-06-25 LAB — COMPREHENSIVE METABOLIC PANEL
ALT: 10 U/L (ref 0–44)
AST: 12 U/L — ABNORMAL LOW (ref 15–41)
Albumin: 2.2 g/dL — ABNORMAL LOW (ref 3.5–5.0)
Alkaline Phosphatase: 30 U/L — ABNORMAL LOW (ref 38–126)
Anion gap: 11 (ref 5–15)
BUN: 44 mg/dL — ABNORMAL HIGH (ref 8–23)
CO2: 24 mmol/L (ref 22–32)
Calcium: 7.9 mg/dL — ABNORMAL LOW (ref 8.9–10.3)
Chloride: 103 mmol/L (ref 98–111)
Creatinine, Ser: 1.54 mg/dL — ABNORMAL HIGH (ref 0.61–1.24)
GFR calc Af Amer: 54 mL/min — ABNORMAL LOW (ref >60)
GFR calc non Af Amer: 46 mL/min — ABNORMAL LOW (ref >60)
Glucose, Bld: 103 mg/dL — ABNORMAL HIGH (ref 70–99)
Potassium: 3 mmol/L — ABNORMAL LOW (ref 3.5–5.1)
Sodium: 138 mmol/L (ref 135–145)
Total Bilirubin: 1 mg/dL (ref 0.3–1.2)
Total Protein: 4.6 g/dL — ABNORMAL LOW (ref 6.5–8.1)

## 2018-06-25 LAB — MAGNESIUM: Magnesium: 1.6 mg/dL — ABNORMAL LOW (ref 1.7–2.4)

## 2018-06-25 MED ORDER — POTASSIUM PHOSPHATES 15 MMOLE/5ML IV SOLN
15.0000 mmol | Freq: Once | INTRAVENOUS | Status: AC
  Administered 2018-06-25: 15 mmol via INTRAVENOUS
  Filled 2018-06-25 (×2): qty 5

## 2018-06-25 MED ORDER — SODIUM CHLORIDE 0.9% IV SOLUTION
Freq: Once | INTRAVENOUS | Status: AC
  Administered 2018-06-25: 11:00:00 via INTRAVENOUS

## 2018-06-25 MED ORDER — POTASSIUM CHLORIDE 10 MEQ/100ML IV SOLN
10.0000 meq | INTRAVENOUS | Status: AC
  Administered 2018-06-25 (×5): 10 meq via INTRAVENOUS
  Filled 2018-06-25 (×5): qty 100

## 2018-06-25 MED ORDER — POTASSIUM CHLORIDE CRYS ER 20 MEQ PO TBCR
40.0000 meq | EXTENDED_RELEASE_TABLET | Freq: Once | ORAL | Status: AC
  Administered 2018-06-25: 40 meq via ORAL
  Filled 2018-06-25: qty 2

## 2018-06-25 MED ORDER — MAGNESIUM SULFATE 2 GM/50ML IV SOLN
2.0000 g | Freq: Once | INTRAVENOUS | Status: AC
  Administered 2018-06-25: 2 g via INTRAVENOUS
  Filled 2018-06-25: qty 50

## 2018-06-25 NOTE — Progress Notes (Signed)
PROGRESS NOTE    [name] Rose  VZD:[id number] DOB: 1951/02/09 DOA: 06/21/2018 PCP: Shirline Frees, MD Brief Narrative: 68 y.o.malewith medical history significant ofstage IV bladder cancer going through chemotherapy his last chemotherapy was 224 2028 days prior to admission to the hospital. Patient reports usually the second week after the chemotherapy he feels very weak but this is the first week after chemotherapy he has no appetite very decreased p.o. intake with lightheadedness and dizziness upon standing. He is also not able to eat due to ulcers in his mouth. Low-grade temp at home. He does complain of nausea but no vomiting diarrhea or constipation. Denies any cough shortness of breath. He reported hematuria at home has not urinated since coming to the ER.    ED Course:Patient received cefepime sodium 136 potassium 4.1 BUN 58 creatinine 1.69 anion gap is 13 magnesium 1.5 lipase 27 total bilirubin 1.8 lactic acid level was 3.1 white count was 0.2 hemoglobin 10.7 platelet count 10. EKG in the ER rapid A. fib   Assessment & Plan:   Active Problems:   Neutropenic fever (HCC)   New onset a-fib (HCC)   Mucositis due to chemotherapy   AKI (acute kidney injury) ([place])   Pancytopenia (HCC)   MSSA bacteremia   Essential hypertension   Hyperglycemia   Mild hyperlipidemia   Bacteremia   #1 neutropenic fever/sepsis inin the setting of stage IV bladder cancer status post recent chemo.Patient presented with hypotension tachycardia and fever and severe neutropenia and thrombocytopenia. Blood culture with MSSA bacteremia. DC vancomycin continue cefepime.  Transthoracic echo ejection fraction 55 to 60%.  Moderate thickening of the aortic valve probably mild calcification of the valve no vegetations detected.  TEE when more stable. Ultra Sound at the port site and no evidence of fluid collection or abscess noted.  #2 pancytopenia secondary to recent chemo - transfuse to keep  hb above 8 and transfuse platelets for less than 10,000 or if actively bleeding.he received granix.transfuse platelets today  #3 mucositis we will treat with Magic mouthwash.  #4 hypotensionpatient's blood pressure is trending up. Will restart home antihypertensives.   #5 new onset atrial fibrillation rate controlled on cardizem and metoprolol.  #6 AKI secondary to dehydration continue IV fluids  #7 hypomagnesemia/hypokalemia/hypophosphatemia-  replete DVT prophylaxis:SCD Code Status:Full code Family Communication none Disposition Plan:Pending clinical improvement    Estimated body mass index is 34.02 kg/m as calculated from the following:   Height as of this encounter: $RemoveBeforeD'6\' 2"'[id number]$  (1.88 m).   Weight as of this encounter: 120.2 kg.   Consultants:   onc cardio    Subjective:  No new complaints no chest pain sob has dry cough chronic feels better had a bm Objective: Vitals:   06/25/18 0400 06/25/18 0600 06/25/18 0844 06/25/18 0915  BP: 115/64 (!) 114/56  (!) 120/52  Pulse: 77 (!) 44  (!) 103  Resp: 20 (!) 23    Temp:   98 F (36.7 C)   TempSrc:   Oral   SpO2: 98% 95%    Weight:      Height:        Intake/Output Summary (Last 24 hours) at 06/25/2018 0927 Last data filed at 06/25/2018 0700 Gross per 24 hour  Intake 1998.36 ml  Output 800 ml  Net 1198.36 ml   Filed Weights   06/21/18 1418  Weight: 120.2 kg    Examination:  General exam: Appears calm and comfortable  Respiratory system: Clear to auscultation. Respiratory effort normal. Cardiovascular system: S1 &  S2 heard, RRR. No JVD, murmurs, rubs, gallops or clicks. No pedal edema. Gastrointestinal system: Abdomen is nondistended, soft and nontender. No organomegaly or masses felt. Normal bowel sounds heard. Central nervous system: Alert and oriented. No focal neurological deficits. Extremities: Symmetric 5 x 5 power. Skin: No rashes, lesions or ulcers Psychiatry: Judgement and insight appear normal.  Mood & affect appropriate.     Data Reviewed: I have personally reviewed following labs and imaging studies  CBC: Recent Labs  Lab 06/21/18 1438 06/22/18 0226 06/23/18 0232 06/24/18 0423 06/25/18 0606  WBC 0.2* 0.1* 0.2* 0.4* 1.0*  NEUTROABS 0.0*  --   --  TOO FEW TO COUNT, SMEAR AVAILABLE FOR REVIEW TOO FEW TO COUNT, SMEAR AVAILABLE FOR REVIEW  HGB 10.7* 7.8* 9.6* 9.3* 7.8*  HCT 32.1* 24.7* 29.8* 28.0* 24.3*  MCV 94.4 98.0 93.1 93.0 95.7  PLT 10* 28* 15* 6* <5*   Basic Metabolic Panel: Recent Labs  Lab 06/21/18 1438 06/22/18 0226 06/23/18 0232 06/24/18 0423 06/24/18 0612 06/24/18 1735 06/25/18 0606  NA 136 138 138 136  --  135 138  K 4.1 3.4* 3.0* 2.6*  --  2.9* 3.0*  CL 97* 104 101 100  --  102 103  CO2 $Re'26 23 26 23  'RZs$ --  25 24  GLUCOSE 213* 158* 126* 120*  --  112* 103*  BUN 58* 48* 36* 43*  --  46* 44*  CREATININE 1.69* 1.56* 1.56* 1.67*  --  1.61* 1.54*  CALCIUM 9.0 8.1* 8.1* 8.2*  --  7.8* 7.9*  MG 1.5* 1.5*  --  1.2*  --   --  1.6*  PHOS  --   --   --   --  1.9*  --   --    GFR: Estimated Creatinine Clearance: 65 mL/min (A) (by C-G formula based on SCr of 1.54 mg/dL (H)). Liver Function Tests: Recent Labs  Lab 06/21/18 1438 06/22/18 0226 06/23/18 0232 06/24/18 0423 06/25/18 0606  AST 19 17 14* 16 12*  ALT $Re'23 21 18 10 10  'kWB$ ALKPHOS 51 42 35* 31* 30*  BILITOT 1.8* 1.4* 1.2 1.3* 1.0  PROT 6.2* 5.1* 5.3* 5.0* 4.6*  ALBUMIN 3.6 2.9* 2.9* 2.5* 2.2*   Recent Labs  Lab 06/21/18 1438  LIPASE 27   No results for input(s): AMMONIA in the last 168 hours. Coagulation Profile: No results for input(s): INR, PROTIME in the last 168 hours. Cardiac Enzymes: Recent Labs  Lab 06/21/18 1438  TROPONINI <0.03   BNP (last 3 results) No results for input(s): PROBNP in the last 8760 hours. HbA1C: No results for input(s): HGBA1C in the last 72 hours. CBG: Recent Labs  Lab 06/24/18 0434  GLUCAP 109*   Lipid Profile: No results for input(s): CHOL, HDL,  LDLCALC, TRIG, CHOLHDL, LDLDIRECT in the last 72 hours. Thyroid Function Tests: Recent Labs    06/23/18 0928  TSH 0.455   Anemia Panel: No results for input(s): VITAMINB12, FOLATE, FERRITIN, TIBC, IRON, RETICCTPCT in the last 72 hours. Sepsis Labs: Recent Labs  Lab 06/21/18 1435 06/21/18 1717 06/21/18 1732 06/22/18 0226 06/22/18 1716 06/23/18 0232 06/23/18 0647  PROCALCITON  --   --  1.08 2.81  --  17.47  --   LATICACIDVEN 3.1* 2.5*  --   --  2.7*  --  1.8    Recent Results (from the past 240 hour(s))  Culture, blood (routine x 2)     Status: Abnormal   Collection Time: 06/21/18  2:13 PM  Result Value  Ref Range Status   Specimen Description   Final    BLOOD RIGHT ANTECUBITAL Performed at [facility] [address], [place], [place] [address]    Special Requests   Final    BOTTLES DRAWN AEROBIC AND ANAEROBIC Blood Culture results may not be optimal due to an excessive volume of blood received in culture bottles Performed at [facility] [address] [place], [place] [address]    Culture  Setup Time   Final    GRAM POSITIVE COCCI CRITICAL RESULT CALLED TO, READ BACK BY AND VERIFIED WITH: PHRMD J GADHIA $Remove'@0846'[id number]$  06/22/2018 BY S GEZAHEGN Performed at [facility], [place] [address] [place], [place] [address]    Culture STAPHYLOCOCCUS AUREUS (A)  Final   Report Status 06/24/2018 FINAL  Final   Organism ID, Bacteria STAPHYLOCOCCUS AUREUS  Final      Susceptibility   Staphylococcus aureus - MIC*    CIPROFLOXACIN <=0.5 SENSITIVE Sensitive     ERYTHROMYCIN <=0.25 SENSITIVE Sensitive     GENTAMICIN <=0.5 SENSITIVE Sensitive     OXACILLIN <=0.25 SENSITIVE Sensitive     TETRACYCLINE <=1 SENSITIVE Sensitive     VANCOMYCIN <=0.5 SENSITIVE Sensitive     TRIMETH/SULFA <=10 SENSITIVE Sensitive     CLINDAMYCIN <=0.25 SENSITIVE Sensitive     RIFAMPIN <=0.5 SENSITIVE Sensitive     Inducible Clindamycin NEGATIVE Sensitive     *  STAPHYLOCOCCUS AUREUS  Urine culture     Status: Abnormal   Collection Time: 06/21/18  2:13 PM  Result Value Ref Range Status   Specimen Description   Final    URINE, RANDOM Performed at [facility] [address], [place], [place] [address]    Special Requests   Final    NONE Performed at [facility], [place] [address], [place], [place] [address]    Culture (A)  Final    10,000 COLONIES/mL DIPHTHEROIDS(CORYNEBACTERIUM SPECIES) Standardized susceptibility testing for this organism is not available. Performed at [facility], [place] [address], [place], [place] [address]    Report Status 06/23/2018 FINAL  Final  Blood Culture ID Panel (Reflexed)     Status: Abnormal   Collection Time: 06/21/18  2:13 PM  Result Value Ref Range Status   Enterococcus species NOT DETECTED NOT DETECTED Final   Listeria monocytogenes NOT DETECTED NOT DETECTED Final   Staphylococcus species DETECTED (A) NOT DETECTED Final    Comment: CRITICAL RESULT CALLED TO, READ BACK BY AND VERIFIED WITH: PHRMD J GADHIA $Remove'@0846'BvOmoCi$  06/22/2018 BY S GEZAHEGN    Staphylococcus aureus (BCID) DETECTED (A) NOT DETECTED Final    Comment: Methicillin (oxacillin) susceptible Staphylococcus aureus (MSSA). Preferred therapy is anti staphylococcal beta lactam antibiotic (Cefazolin or Nafcillin), unless clinically contraindicated. CRITICAL RESULT CALLED TO, READ BACK BY AND VERIFIED WITH: PHRMD J GADHIA $Remove'@0846'[email or web address]$  06/22/2018 BY S GEZAHEGN    Methicillin resistance NOT DETECTED NOT DETECTED Final   Streptococcus species NOT DETECTED NOT DETECTED Final   Streptococcus agalactiae NOT DETECTED NOT DETECTED Final   Streptococcus pneumoniae NOT DETECTED NOT DETECTED Final   Streptococcus pyogenes NOT DETECTED NOT DETECTED Final   Acinetobacter baumannii NOT DETECTED NOT DETECTED Final   Enterobacteriaceae species NOT DETECTED NOT DETECTED Final   Enterobacter cloacae complex NOT DETECTED NOT DETECTED Final    Escherichia coli NOT DETECTED NOT DETECTED Final   Klebsiella oxytoca NOT DETECTED NOT DETECTED Final   Klebsiella pneumoniae NOT DETECTED NOT DETECTED Final   Proteus species  NOT DETECTED NOT DETECTED Final   Serratia marcescens NOT DETECTED NOT DETECTED Final   Haemophilus influenzae NOT DETECTED NOT DETECTED Final   Neisseria meningitidis NOT DETECTED NOT DETECTED Final   Pseudomonas aeruginosa NOT DETECTED NOT DETECTED Final   Candida albicans NOT DETECTED NOT DETECTED Final   Candida glabrata NOT DETECTED NOT DETECTED Final   Candida krusei NOT DETECTED NOT DETECTED Final   Candida parapsilosis NOT DETECTED NOT DETECTED Final   Candida tropicalis NOT DETECTED NOT DETECTED Final    Comment: Performed at [facility], [place] [address] [place], [place] [address]  Culture, blood (routine x 2)     Status: None (Preliminary result)   Collection Time: 06/21/18  3:22 PM  Result Value Ref Range Status   Specimen Description   Final    BLOOD RIGHT FOREARM Performed at [facility] [address] [place], [place] [address]    Special Requests   Final    BOTTLES DRAWN AEROBIC AND ANAEROBIC Blood Culture results may not be optimal due to an excessive volume of blood received in culture bottles Performed at [facility] [address], [place], [place] [address]    Culture   Final    NO GROWTH 3 DAYS Performed at [facility], [place] [address] [place], [place] [address]    Report Status PENDING  Incomplete  MRSA PCR Screening     Status: None   Collection Time: 06/21/18  8:48 PM  Result Value Ref Range Status   MRSA by PCR NEGATIVE NEGATIVE Final    Comment:        The GeneXpert MRSA Assay (FDA approved for NASAL specimens only), is one component of a comprehensive MRSA colonization surveillance program. It is not intended to diagnose MRSA infection nor to guide or monitor treatment for MRSA infections. Performed at [facility], [place] [address], [place], [place] [address]   Culture, blood (Routine X 2) w Reflex to ID Panel     Status: None (Preliminary result)   Collection Time: 06/23/18  1:00 PM  Result Value Ref Range Status   Specimen Description   Final    BLOOD RIGHT ANTECUBITAL Performed at [facility] [address], [place], [place] [address]    Special Requests   Final    BOTTLES DRAWN AEROBIC AND ANAEROBIC Blood Culture adequate volume Performed at [facility] [address], [place], [place] [address]    Culture   Final    NO GROWTH 1 DAY Performed at [facility], [place] [address], [place], [place] [address]    Report Status PENDING  Incomplete  Culture, blood (routine x 2)     Status: None (Preliminary result)   Collection Time: 06/23/18  1:00 PM  Result Value Ref Range Status   Specimen Description   Final    BLOOD RIGHT HAND Performed at [facility], [place] [address] [place], [place] [address]    Special Requests   Final    BOTTLES DRAWN AEROBIC AND ANAEROBIC Blood Culture adequate volume Performed at [facility] [address], [place], [place] [address]    Culture   Final    NO GROWTH 1 DAY Performed at [facility], [place] [address], [place], [place] [address]    Report Status PENDING  Incomplete         Radiology Studies: No results found.  Scheduled Meds: . sodium chloride   Intravenous Once  . sodium chloride   Intravenous Once  . bisacodyl  10 mg Oral Daily  . chlorhexidine  15 mL Mouth/Throat QID  . Chlorhexidine Gluconate Cloth  6 each Topical Daily  . diltiazem  240 mg Oral Daily  . feeding supplement (ENSURE ENLIVE)  237 mL Oral TID BM  . loratadine  10 mg Oral Daily  . magic mouthwash w/lidocaine  5 mL Oral QID  . metoprolol tartrate  25 mg Oral BID  . promethazine  12.5 mg Intravenous Once  . sodium bicarbonate/sodium chloride   Mouth Rinse Q4H  . sodium  chloride flush  10-40 mL Intracatheter Q12H  . Tbo-Filgrastim  480 mcg Subcutaneous q1800   Continuous Infusions: . sodium chloride Stopped (06/24/18 0611)  . sodium chloride 75 mL/hr at 06/25/18 0540  . ceFEPime (MAXIPIME) IV 2 g (06/25/18 0810)  . magnesium sulfate 1 - 4 g bolus IVPB 2 g (06/25/18 0922)  . potassium chloride 10 mEq (06/25/18 0920)  . potassium PHOSPHATE IVPB (in mmol)       LOS: 4 days     Georgette Shell, MD [facility]  If 7PM-7AM, please contact night-coverage [email or web address] Password [facility] 06/25/2018, 9:27 AM

## 2018-06-25 NOTE — Progress Notes (Signed)
Diagnosis Neutropenic fever ([place]) - Plan: 0.9 %  sodium chloride infusion, Tbo-Filgrastim (GRANIX) injection 480 mcg, sodium bicarbonate/sodium chloride mouthwash 1026ml, 0.9 %  sodium chloride infusion (Manually program via Guardrails IV Fluids), furosemide (LASIX) injection 20 mg, furosemide (LASIX) injection 20 mg, chlorhexidine (PERIDEX) 0.12 % solution 15 mL  Sepsis, due to unspecified organism, unspecified whether acute organ dysfunction present (HCC)  Thrombocytopenia (HCC)  New onset a-fib ([place])  Bacteremia - Plan: [place] CHEST SOFT TISSUE, [place] CHEST SOFT TISSUE  Fever - Plan: CANCELED: [place] CHEST SOFT TISSUE, CANCELED: [place] CHEST SOFT TISSUE  Cough - Plan: DG CHEST PORT 1 VIEW, DG CHEST PORT 1 VIEW  AKI (acute kidney injury) ([place]) - Plan: potassium chloride 10 mEq in 50 mL *CENTRAL LINE* IVPB  MSSA bacteremia - Plan: chlorhexidine (PERIDEX) 0.12 % solution 15 mL   Interval History:  68 y.o.malewith stage IV bladder cancer going through chemotherapy.  He is followed by [name] for Oncology.     ED Course:Patient received cefepime/ EKG in the ER rapid A. fib  Subjective:  Pt denies complaints.   Objective: Vitals Patient Vitals for the past 24 hrs:  BP Temp Temp src Pulse Resp SpO2  06/25/18 1000 117/77 - - 87 (!) 26 95 %  06/25/18 0915 (!) 120/52 - - (!) 103 - -  06/25/18 0844 - 98 F (36.7 C) Oral - - -  06/25/18 0600 (!) 114/56 - - (!) 44 (!) 23 95 %  06/25/18 0400 115/64 - - 77 20 98 %  06/25/18 0330 - 98 F (36.7 C) Oral - - -  06/25/18 0000 138/65 - - 98 (!) 23 96 %  06/24/18 2348 - 98 F (36.7 C) Oral - - -  06/24/18 2200 135/62 - - 93 (!) 34 96 %  06/24/18 2000 132/74 - - 68 (!) 25 94 %  06/24/18 1930 - 97.8 F (36.6 C) Oral - - -  06/24/18 1900 137/70 - - 83 (!) 25 99 %  06/24/18 1800 130/74 - - 93 (!) 27 100 %  06/24/18 1700 (!) 118/53 - - 81 (!) 29 95 %  06/24/18 1623 - 98.6 F (37 C) Oral - - -  06/24/18 1600 (!) 99/32 - - 87 (!) 32 94 %   06/24/18 1500 - - - 96 (!) 23 97 %  06/24/18 1400 135/76 - - 96 (!) 23 100 %  06/24/18 1300 133/70 98.2 F (36.8 C) Oral 90 (!) 31 95 %  06/24/18 1200 119/74 - - 89 (!) 26 97 %  06/24/18 1100 (!) 143/73 - - 99 (!) 24 99 %    Physical Exam Constitutional: Well-developed, well-nourished, and in no distress.   HENT: Head: Normocephalic and atraumatic.  Mouth/Throat: No oropharyngeal exudate. Mucosa moist. Eyes: Pupils are equal, round, and reactive to light. Conjunctivae are normal. No scleral icterus.  Neck: Normal range of motion. Neck supple. No JVD present.  Cardiovascular: Normal rate, regular rhythm and normal heart sounds.  Exam reveals no gallop and no friction rub.   No murmur heard. Pulmonary/Chest: Effort normal and breath sounds normal. No respiratory distress. No wheezes.No rales.  Abdominal: Soft. Bowel sounds are normal. No distension. There is no tenderness. There is no guarding.  Musculoskeletal: No edema or tenderness.  Lymphadenopathy: No cervical, axillary or supraclavicular adenopathy.  Neurological: Alert and oriented to person, place, and time. No cranial nerve deficit.  Skin: Skin is warm and dry. No rash noted. No erythema. No pallor.  Psychiatric: Affect  and judgment normal.    Medications   Allergies Patient has no known allergies.  Review of Systems Review of Systems - Oncology ROS negative   Labs No results displayed because visit has over 200 results.       Pathology Orders Placed This Encounter  Procedures  . Critical Care    This order was created via procedure documentation    Standing Status:   Standing    Number of Occurrences:   1  . Culture, blood (routine x 2)    Standing Status:   Standing    Number of Occurrences:   2  . Urine culture    Standing Status:   Standing    Number of Occurrences:   1  . MRSA PCR Screening    Standing Status:   Standing    Number of Occurrences:   1  . Blood Culture ID Panel (Reflexed)     Standing Status:   Standing    Number of Occurrences:   1  . Culture, blood (Routine X 2) w Reflex to ID Panel    Standing Status:   Standing    Number of Occurrences:   1    Order Specific Question:   Patient immune status    Answer:   Normal  . Culture, blood (routine x 2)    Standing Status:   Standing    Number of Occurrences:   2  . DG Chest Port 1 View    Standing Status:   Standing    Number of Occurrences:   1    Order Specific Question:   Reason for Exam (SYMPTOM  OR DIAGNOSIS REQUIRED)    Answer:   fever  . [place] CHEST SOFT TISSUE    Please eval port for abscess    Standing Status:   Standing    Number of Occurrences:   1    Order Specific Question:   Symptom/Reason for Exam    Answer:   Bacteremia [790.7.ICD-9-CM]  . DG CHEST PORT 1 VIEW    Standing Status:   Standing    Number of Occurrences:   1    Order Specific Question:   Symptom/Reason for Exam    Answer:   Cough [867.2.ICD-9-CM]    Order Specific Question:   Radiology Contrast Protocol - do NOT remove file path    Answer:   [email or web address]  . Comprehensive metabolic panel    Standing Status:   Standing    Number of Occurrences:   1  . Lipase, blood    Standing Status:   Standing    Number of Occurrences:   1  . Troponin I - Once    Standing Status:   Standing    Number of Occurrences:   1  . CBC with Differential    Standing Status:   Standing    Number of Occurrences:   1  . Urinalysis, Routine w reflex microscopic    Standing Status:   Standing    Number of Occurrences:   1  . Lactic acid, plasma    Standing Status:   Standing    Number of Occurrences:   2  . Magnesium    Standing Status:   Standing    Number of Occurrences:   1  . CBC    With diff    Standing Status:   Standing    Number of Occurrences:   4  . Magnesium    Standing Status:   Standing  Number of Occurrences:   1  . Procalcitonin - Baseline    Standing Status:   Standing    Number  of Occurrences:   1  . Procalcitonin    Standing Status:   Standing    Number of Occurrences:   2  . Lactic acid, plasma    Standing Status:   Standing    Number of Occurrences:   1    Order Specific Question:   Specimen collection method    Answer:   Unit=Unit collect  . Lactic acid, plasma    Standing Status:   Standing    Number of Occurrences:   1    Order Specific Question:   Specimen collection method    Answer:   Unit=Unit collect  . TSH    Add to labs    Standing Status:   Standing    Number of Occurrences:   1    Order Specific Question:   Specimen collection method    Answer:   Unit=Unit collect  . Glucose, capillary    Standing Status:   Standing    Number of Occurrences:   1  . Magnesium    Standing Status:   Standing    Number of Occurrences:   1    Order Specific Question:   Specimen collection method    Answer:   Lab=Lab collect  . Phosphorus    Add to today's lab    Standing Status:   Standing    Number of Occurrences:   1    Order Specific Question:   Specimen collection method    Answer:   Lab=Lab collect  . Basic metabolic panel    Standing Status:   Standing    Number of Occurrences:   1    Order Specific Question:   Specimen collection method    Answer:   Lab=Lab collect  . Comprehensive metabolic panel    Standing Status:   Standing    Number of Occurrences:   4    Order Specific Question:   Specimen collection method    Answer:   Unit=Unit collect  . CBC with Differential/Platelet    Standing Status:   Standing    Number of Occurrences:   4    Order Specific Question:   Specimen collection method    Answer:   Unit=Unit collect  . Magnesium    Standing Status:   Standing    Number of Occurrences:   1    Order Specific Question:   Specimen collection method    Answer:   Unit=Unit collect  . DIET SOFT Room service appropriate? Yes; Fluid consistency: Thin    Standing Status:   Standing    Number of Occurrences:   1    Order Specific  Question:   Room service appropriate?    Answer:   Yes    Order Specific Question:   Fluid consistency:    Answer:   Thin  . Cardiac monitoring    Standing Status:   Standing    Number of Occurrences:   1  . Refer to Sidebar Report for: Sepsis Bundle ED/IP    Sepsis Bundle ED/IP    Standing Status:   Standing    Number of Occurrences:   1  . Document vital signs within 1-hour of fluid bolus completion and notify provider of bolus completion    Standing Status:   Standing    Number of Occurrences:   1  . Document Actual /  Estimated Weight    Standing Status:   Standing    Number of Occurrences:   1  . Insert peripheral IV x 2    Angiocath size 20G or larger    Standing Status:   Standing    Number of Occurrences:   1  . Initiate Carrier Fluid Protocol    Standing Status:   Standing    Number of Occurrences:   1  . Practitioner attestation of consent    I, the ordering practitioner, attest that I have discussed with the patient the benefits, risks, side effects, alternatives, likelihood of achieving goals and potential problems during recovery for the procedure listed.    Standing Status:   Standing    Number of Occurrences:   1    Order Specific Question:   Procedure    Answer:   Blood product(s)  . Complete patient signature process for consent form    Standing Status:   Standing    Number of Occurrences:   1  . Vital signs    Standing Status:   Standing    Number of Occurrences:   1  . Notify physician    Standing Status:   Standing    Number of Occurrences:   20    Order Specific Question:   Notify Physician    Answer:   for pulse less than 55 or greater than 120    Order Specific Question:   Notify Physician    Answer:   for respiratory rate less than 12 or greater than 25    Order Specific Question:   Notify Physician    Answer:   for temperature greater than 100.5 F    Order Specific Question:   Notify Physician    Answer:   for urinary output less than 30 mL/hr for  four hours    Order Specific Question:   Notify Physician    Answer:   for systolic BP less than 90 or greater than 741, diastolic BP less than 60 or greater than 100  . Initiate Oral Care Protocol    Standing Status:   Standing    Number of Occurrences:   1  . Initiate Carrier Fluid Protocol    Standing Status:   Standing    Number of Occurrences:   1  . RN may order General Admission PRN Orders utilizing "General Admission PRN medications" (through manage orders) for the following patient needs: allergy symptoms (Claritin), cold sores (Carmex), cough (Robitussin DM), eye irritation (Liquifilm Tears), hemorrhoids (Tucks), indigestion (Maalox), minor skin irritation (Hydrocortisone Cream), muscle pain Suezanne Jacquet Gay), nose irritation (saline nasal spray) and sore throat (Chloraseptic spray).    Standing Status:   Standing    Number of Occurrences:   [id number]  . SCDs    Standing Status:   Standing    Number of Occurrences:   1    Order Specific Question:   Laterality    Answer:   Bilateral  . Bladder scan    Standing Status:   Standing    Number of Occurrences:   1  . Practitioner attestation of consent    I, the ordering practitioner, attest that I have discussed with the patient the benefits, risks, side effects, alternatives, likelihood of achieving goals and potential problems during recovery for the procedure listed.    Standing Status:   Standing    Number of Occurrences:   1    Order Specific Question:   Procedure    Answer:  Blood product(s)  . Complete patient signature process for consent form    Standing Status:   Standing    Number of Occurrences:   1  . Apply neutral pressure cap    Standing Status:   Standing    Number of Occurrences:   20  . May draw labs from central line    Standing Status:   Standing    Number of Occurrences:   20  . For first catheter occlusion notify IV team ([place], [place]) or provider at all other sites.    Standing Status:   Standing    Number of  Occurrences:   1  . Complete oral assessment tool on admission, transfer and at any change in patient condition.    Standing Status:   Standing    Number of Occurrences:   1  . Brush teeth with toothbrush; use toothpaste with fluoride    Standing Status:   Standing    Number of Occurrences:   1  . Perform a daily oral assessment to evaluate integrity of the oral cavity/mucosa    Look at lips, oral mucosa, tongue, gums, teeth, hard/soft palate for presence of plaque, dried or coated secretions, signs of infection and/or bleeding.    Standing Status:   Standing    Number of Occurrences:   1  . Practitioner attestation of consent    I, the ordering practitioner, attest that I have discussed with the patient the benefits, risks, side effects, alternatives, likelihood of achieving goals and potential problems during recovery for the procedure listed.    Standing Status:   Standing    Number of Occurrences:   1    Order Specific Question:   Procedure    Answer:   Blood product(s)  . Complete patient signature process for consent form    Standing Status:   Standing    Number of Occurrences:   1  . Full code    Standing Status:   Standing    Number of Occurrences:   1  . Call Code Sepsis Karen Chafe [phone]) Reason for Consult? tracking    Standing Status:   Standing    Number of Occurrences:   1    Order Specific Question:   Initiate "Code Sepsis" tracking    Answer:   yes    Order Specific Question:   Contact PCCM ([phone])    Answer:   No    Order Specific Question:   Reason for Consult?    Answer:   tracking  . Consult to hospitalist    Standing Status:   Standing    Number of Occurrences:   1    Order Specific Question:   Place call to:    Answer:   [employer] photographer Question:   Reason for Consult    Answer:   Admit  . ceFEPIme (MAXIPIME) per pharmacy consult    Standing Status:   Standing    Number of Occurrences:   1    Order Specific  Question:   Antibiotic Indication:    Answer:   Other Indication (list below)    Order Specific Question:   Other Indication:    Answer:   MSSA bacteremia, febrile neutropenia.  . Pulse oximetry, continuous    Standing Status:   Standing    Number of Occurrences:   1  . ED EKG    Standing Status:   Standing    Number of Occurrences:   1  Order Specific Question:   Reason for Exam    Answer:   Weakness  . EKG 12-Lead    Standing Status:   Standing    Number of Occurrences:   1    Order Specific Question:   Reason for Exam    Answer:   afib  . EKG 12-Lead    Standing Status:   Standing    Number of Occurrences:   1    Order Specific Question:   Reason for Exam    Answer:   afib  . ECHOCARDIOGRAM COMPLETE    Standing Status:   Standing    Number of Occurrences:   1    Order Specific Question:   Perflutren DEFINITY (image enhancing agent) should be administered unless hypersensitivity or allergy exist    Answer:   Administer Perflutren    Order Specific Question:   Reason for exam-Echo    Answer:   Bacteremia  790.7 / R78.81  . Type and screen [place]     Standing Status:   Standing    Number of Occurrences:   1  . Prepare Pheresed Platelets    Standing Status:   Standing    Number of Occurrences:   1    Order Specific Question:   Number of Apheresis Units    Answer:   1 unit (6-10 packs)    Order Specific Question:   Transfusion Indications    Answer:   PLT Count </=10,000/mm    Order Specific Question:   If emergent release call blood bank    Answer:   [facility] [phone]  . Prepare RBC    Standing Status:   Standing    Number of Occurrences:   1    Order Specific Question:   # of Units    Answer:   2 units    Order Specific Question:   Transfusion Indications    Answer:   Symptomatic Anemia    Order Specific Question:   If emergent release call blood bank    Answer:   Not emergent release    Order  Specific Question:   Instructions:    Answer:   Transfuse  . Type and screen [place]     Standing Status:   Standing    Number of Occurrences:   1  . Prepare Pheresed Platelets    Standing Status:   Standing    Number of Occurrences:   1    Order Specific Question:   Number of Apheresis Units    Answer:   1 unit (6-10 packs)    Order Specific Question:   Transfusion Indications    Answer:   PLT Count </=10,000/mm    Order Specific Question:   If emergent release call blood bank    Answer:   [facility] [phone]  . Routine line care    Standing Status:   Standing    Number of Occurrences:   33  . Admit to Inpatient (patient's expected length of stay will be greater than 2 midnights or inpatient only procedure)    Standing Status:   Standing    Number of Occurrences:   1    Order Specific Question:   Hospital Area    Answer:   [facility] [[id number]]    Order Specific Question:   Level of Care    Answer:  Stepdown [14]    Order Specific Question:   Admit to SDU based on following criteria    Answer:   Hemodynamic compromise or significant risk of instability:  Patient requiring short term acute titration and management of vasoactive drips, and invasive monitoring (i.e., CVP and Arterial line).    Order Specific Question:   Diagnosis    Answer:   Neutropenic fever [facility]) [[id number]]    Order Specific Question:   Admitting Physician    Answer:   Georgette Shell [[id number]]    Order Specific Question:   Attending Physician    Answer:   Georgette Shell [[id number]]    Order Specific Question:   Estimated length of stay    Answer:   3 - 4 days    Order Specific Question:   Certification:    Answer:   I certify this patient will need inpatient services for at least 2 midnights    Order Specific Question:   PT Class (Do Not Modify)    Answer:   Inpatient [101]    Order Specific Question:   PT Acc Code (Do  Not Modify)    Answer:   Private [1]      Assessment and Plan:  1.  Neutropenic fever/sepsis inin the setting of stage IV bladder cancer status post recent chemo.Patient followed by [name].  Blood culture with MSSA bacteremia. Pt on cefepime.Transthoracic echo ejection fraction 55 to 60%. Moderate thickening of the aortic valve probably mild calcification of the valve no vegetations detected. TEE when more stable. UltraSound at the port site and no evidence of fluid collection or abscess noted.  2.  Pancytopenia.  Labs done 06/25/2018 showed WBC 1 HB 7.8 plts < 5.   Pt on Granix.  Planned for platelet transfusion today.  Continue to follow labs.     3.  Atrial fibrillation rate controlled on cardizem and metoprolol.  4,  AKI secondary to dehydration.  Cr 1.54.  Pt on IVF.   5.Hypomagnesemia/hypokalemia/hypophosphatemia.  Electrolyte replacement per Dr. Zigmund Daniel.    25 minutes spent with more than 50% in review of records, counseling and coordination of care.

## 2018-06-26 LAB — CBC WITH DIFFERENTIAL/PLATELET
Abs Immature Granulocytes: 0 10*3/uL (ref 0.00–0.07)
Abs Immature Granulocytes: 0.47 10*3/uL — ABNORMAL HIGH (ref 0.00–0.07)
Basophils Absolute: 0 10*3/uL (ref 0.0–0.1)
Basophils Absolute: 0 10*3/uL (ref 0.0–0.1)
Basophils Relative: 0 %
Basophils Relative: 1 %
Eosinophils Absolute: 0 10*3/uL (ref 0.0–0.5)
Eosinophils Absolute: 0.1 10*3/uL (ref 0.0–0.5)
Eosinophils Relative: 0 %
Eosinophils Relative: 2 %
HCT: 32.1 % — ABNORMAL LOW (ref 39.0–52.0)
HCT: 25 % — ABNORMAL LOW (ref 39.0–52.0)
Hemoglobin: 10.7 g/dL — ABNORMAL LOW (ref 13.0–17.0)
Hemoglobin: 8.2 g/dL — ABNORMAL LOW (ref 13.0–17.0)
Immature Granulocytes: 0 %
Immature Granulocytes: 11 %
Lymphocytes Relative: 85 %
Lymphocytes Relative: 13 %
Lymphs Abs: 0.2 10*3/uL — ABNORMAL LOW (ref 0.7–4.0)
Lymphs Abs: 0.6 10*3/uL — ABNORMAL LOW (ref 0.7–4.0)
MCH: 31.5 pg (ref 26.0–34.0)
MCH: 30.8 pg (ref 26.0–34.0)
MCHC: 33.3 g/dL (ref 30.0–36.0)
MCHC: 32.8 g/dL (ref 30.0–36.0)
MCV: 94.4 fL (ref 80.0–100.0)
MCV: 94 fL (ref 80.0–100.0)
Monocytes Absolute: 0 10*3/uL — ABNORMAL LOW (ref 0.1–1.0)
Monocytes Absolute: 0.3 10*3/uL (ref 0.1–1.0)
Monocytes Relative: 5 %
Monocytes Relative: 8 %
Neutro Abs: 0 10*3/uL — ABNORMAL LOW (ref 1.7–7.7)
Neutro Abs: 2.9 10*3/uL (ref 1.7–7.7)
Neutrophils Relative %: 10 %
Neutrophils Relative %: 65 %
Platelets: 10 10*3/uL — CL (ref 150–400)
Platelets: 12 10*3/uL — CL (ref 150–400)
RBC: 3.4 MIL/uL — ABNORMAL LOW (ref 4.22–5.81)
RBC: 2.66 MIL/uL — ABNORMAL LOW (ref 4.22–5.81)
RDW: 13.8 % (ref 11.5–15.5)
RDW: 14.6 % (ref 11.5–15.5)
WBC: 0.2 10*3/uL — CL (ref 4.0–10.5)
WBC: 4.4 10*3/uL (ref 4.0–10.5)
nRBC: 0 % (ref 0.0–0.2)
nRBC: 0 % (ref 0.0–0.2)

## 2018-06-26 LAB — CBC
HCT: 24.7 % — ABNORMAL LOW (ref 39.0–52.0)
HCT: 31.9 % — ABNORMAL LOW (ref 39.0–52.0)
Hemoglobin: 7.8 g/dL — ABNORMAL LOW (ref 13.0–17.0)
Hemoglobin: 10.5 g/dL — ABNORMAL LOW (ref 13.0–17.0)
MCH: 31.5 pg (ref 26.0–34.0)
MCH: 30.6 pg (ref 26.0–34.0)
MCHC: 32.8 g/dL (ref 30.0–36.0)
MCHC: 32.9 g/dL (ref 30.0–36.0)
MCV: 98 fL (ref 80.0–100.0)
MCV: 93 fL (ref 80.0–100.0)
Platelets: 28 10*3/uL — CL (ref 150–400)
Platelets: 11 10*3/uL — CL (ref 150–400)
RBC: 2.52 MIL/uL — ABNORMAL LOW (ref 4.22–5.81)
RBC: 3.43 MIL/uL — ABNORMAL LOW (ref 4.22–5.81)
RDW: 13.9 % (ref 11.5–15.5)
RDW: 14.7 % (ref 11.5–15.5)
WBC: 0.1 10*3/uL — CL (ref 4.0–10.5)
WBC: 12.2 10*3/uL — ABNORMAL HIGH (ref 4.0–10.5)
nRBC: 0 % (ref 0.0–0.2)
nRBC: 0 % (ref 0.0–0.2)

## 2018-06-26 LAB — CULTURE, BLOOD (ROUTINE X 2): Culture: NO GROWTH

## 2018-06-26 LAB — COMPREHENSIVE METABOLIC PANEL
ALT: 15 U/L (ref 0–44)
AST: 15 U/L (ref 15–41)
Albumin: 2.5 g/dL — ABNORMAL LOW (ref 3.5–5.0)
Alkaline Phosphatase: 39 U/L (ref 38–126)
Anion gap: 10 (ref 5–15)
BUN: 46 mg/dL — ABNORMAL HIGH (ref 8–23)
CO2: 24 mmol/L (ref 22–32)
Calcium: 7.8 mg/dL — ABNORMAL LOW (ref 8.9–10.3)
Chloride: 103 mmol/L (ref 98–111)
Creatinine, Ser: 1.43 mg/dL — ABNORMAL HIGH (ref 0.61–1.24)
GFR calc Af Amer: 59 mL/min — ABNORMAL LOW (ref >60)
GFR calc non Af Amer: 51 mL/min — ABNORMAL LOW (ref >60)
Glucose, Bld: 104 mg/dL — ABNORMAL HIGH (ref 70–99)
Potassium: 3.1 mmol/L — ABNORMAL LOW (ref 3.5–5.1)
Sodium: 137 mmol/L (ref 135–145)
Total Bilirubin: 0.8 mg/dL (ref 0.3–1.2)
Total Protein: 4.9 g/dL — ABNORMAL LOW (ref 6.5–8.1)

## 2018-06-26 LAB — PREPARE RBC (CROSSMATCH)

## 2018-06-26 LAB — MAGNESIUM: Magnesium: 1.6 mg/dL — ABNORMAL LOW (ref 1.7–2.4)

## 2018-06-26 LAB — PREPARE PLATELET PHERESIS: Unit division: 0

## 2018-06-26 LAB — BPAM PLATELET PHERESIS
Blood Product Expiration Date: 202003152359
ISSUE DATE / TIME: 202003151301
Unit Type and Rh: 5100

## 2018-06-26 MED ORDER — CEFAZOLIN SODIUM-DEXTROSE 2-4 GM/100ML-% IV SOLN
2.0000 g | Freq: Three times a day (TID) | INTRAVENOUS | Status: DC
  Administered 2018-06-26 – 2018-06-27 (×3): 2 g via INTRAVENOUS
  Filled 2018-06-26 (×4): qty 100

## 2018-06-26 MED ORDER — POTASSIUM CHLORIDE CRYS ER 20 MEQ PO TBCR
40.0000 meq | EXTENDED_RELEASE_TABLET | Freq: Once | ORAL | Status: AC
  Administered 2018-06-26: 40 meq via ORAL
  Filled 2018-06-26: qty 2

## 2018-06-26 MED ORDER — SODIUM CHLORIDE 0.9% IV SOLUTION
Freq: Once | INTRAVENOUS | Status: AC
  Administered 2018-06-26: 10:00:00 via INTRAVENOUS

## 2018-06-26 MED ORDER — FUROSEMIDE 10 MG/ML IJ SOLN
INTRAMUSCULAR | Status: AC
  Administered 2018-06-26: 20 mg via INTRAVENOUS
  Filled 2018-06-26: qty 2

## 2018-06-26 MED ORDER — FUROSEMIDE 10 MG/ML IJ SOLN
20.0000 mg | Freq: Once | INTRAMUSCULAR | Status: AC
  Administered 2018-06-26: 20 mg via INTRAVENOUS

## 2018-06-26 MED ORDER — FUROSEMIDE 10 MG/ML IJ SOLN
20.0000 mg | Freq: Once | INTRAMUSCULAR | Status: AC
  Administered 2018-06-26: 20 mg via INTRAVENOUS
  Filled 2018-06-26: qty 2

## 2018-06-26 MED ORDER — MAGNESIUM SULFATE 2 GM/50ML IV SOLN
2.0000 g | Freq: Once | INTRAVENOUS | Status: AC
  Administered 2018-06-26: 2 g via INTRAVENOUS
  Filled 2018-06-26: qty 50

## 2018-06-26 MED ORDER — POTASSIUM CHLORIDE 10 MEQ/100ML IV SOLN
10.0000 meq | INTRAVENOUS | Status: AC
  Administered 2018-06-26 (×6): 10 meq via INTRAVENOUS
  Filled 2018-06-26 (×6): qty 100

## 2018-06-26 NOTE — Progress Notes (Signed)
INFECTIOUS DISEASE PROGRESS NOTE  ID: [name] Rose is a 68 y.o. male with  Active Problems:   Neutropenic fever ([place])   New onset a-fib ([place])   Mucositis due to chemotherapy   AKI (acute kidney injury) ([place])   Pancytopenia ([place])   MSSA bacteremia   Essential hypertension   Hyperglycemia   Mild hyperlipidemia   Bacteremia  Subjective: No complaints.  Eating better  Abtx:  Anti-infectives (From admission, onward)   Start     Dose/Rate Route Frequency Ordered Stop   06/23/18 1600  ceFEPIme (MAXIPIME) 2 g in sodium chloride 0.9 % 100 mL IVPB     2 g 200 mL/hr over 30 Minutes Intravenous Every 8 hours 06/23/18 1012     06/22/18 1800  vancomycin (VANCOCIN) 1,250 mg in sodium chloride 0.9 % 250 mL IVPB  Status:  Discontinued     1,250 mg 166.7 mL/hr over 90 Minutes Intravenous Every 24 hours 06/21/18 1740 06/22/18 1245   06/22/18 1600  ceFAZolin (ANCEF) IVPB 2g/100 mL premix  Status:  Discontinued     2 g 200 mL/hr over 30 Minutes Intravenous Every 8 hours 06/22/18 1432 06/23/18 1003   06/22/18 1400  ceFEPIme (MAXIPIME) 2 g in sodium chloride 0.9 % 100 mL IVPB  Status:  Discontinued     2 g 200 mL/hr over 30 Minutes Intravenous Every 8 hours 06/22/18 1358 06/22/18 1432   06/22/18 0400  ceFEPIme (MAXIPIME) 2 g in sodium chloride 0.9 % 100 mL IVPB  Status:  Discontinued     2 g 200 mL/hr over 30 Minutes Intravenous Every 12 hours 06/21/18 1740 06/22/18 1358   06/21/18 1745  vancomycin (VANCOCIN) 2,000 mg in sodium chloride 0.9 % 500 mL IVPB     2,000 mg 250 mL/hr over 120 Minutes Intravenous  Once 06/21/18 1737 06/21/18 2054   06/21/18 1515  ceFEPIme (MAXIPIME) 2 g in sodium chloride 0.9 % 100 mL IVPB     2 g 200 mL/hr over 30 Minutes Intravenous  Once 06/21/18 1500 06/21/18 1631      Medications:  Scheduled: . sodium chloride   Intravenous Once  . chlorhexidine  15 mL Mouth/Throat QID  . Chlorhexidine Gluconate Cloth  6 each Topical Daily  . diltiazem  240 mg Oral  Daily  . feeding supplement (ENSURE ENLIVE)  237 mL Oral TID BM  . furosemide  20 mg Intravenous Once  . loratadine  10 mg Oral Daily  . magic mouthwash w/lidocaine  5 mL Oral QID  . metoprolol tartrate  25 mg Oral BID  . promethazine  12.5 mg Intravenous Once  . sodium bicarbonate/sodium chloride   Mouth Rinse Q4H  . sodium chloride flush  10-40 mL Intracatheter Q12H    Objective: Vital signs in last 24 hours: Temp:  [97.8 F (36.6 C)-98.6 F (37 C)] 98.6 F (37 C) (03/16 1341) Pulse Rate:  [45-96] 83 (03/16 1341) Resp:  [18-28] 21 (03/16 1341) BP: (109-159)/(54-92) 159/74 (03/16 1341) SpO2:  [92 %-100 %] 97 % (03/16 1341)   General appearance: alert, cooperative and no distress Resp: clear to auscultation bilaterally Chest wall: no tenderness, port site clean, no fluctuance.  Cardio: regular rate and rhythm GI: normal findings: bowel sounds normal and soft, non-tender  Lab Results Recent Labs    06/25/18 0606 06/26/18 0313  WBC 1.0* 4.4  HGB 7.8* 8.2*  HCT 24.3* 25.0*  NA 138 137  K 3.0* 3.1*  CL 103 103  CO2 24 24  BUN  44* 46*  CREATININE 1.54* 1.43*   Liver Panel Recent Labs    06/25/18 0606 06/26/18 0313  PROT 4.6* 4.9*  ALBUMIN 2.2* 2.5*  AST 12* 15  ALT 10 15  ALKPHOS 30* 39  BILITOT 1.0 0.8   Sedimentation Rate No results for input(s): ESRSEDRATE in the last 72 hours. C-Reactive Protein No results for input(s): CRP in the last 72 hours.  Microbiology: Recent Results (from the past 240 hour(s))  Culture, blood (routine x 2)     Status: Abnormal   Collection Time: 06/21/18  2:13 PM  Result Value Ref Range Status   Specimen Description   Final    BLOOD RIGHT ANTECUBITAL Performed at [facility] [address], [place], [place] [address]    Special Requests   Final    BOTTLES DRAWN AEROBIC AND ANAEROBIC Blood Culture results may not be optimal due to an excessive volume of blood received in culture bottles Performed at  [facility] [address], [place], [place] [address]    Culture  Setup Time   Final    GRAM POSITIVE COCCI CRITICAL RESULT CALLED TO, READ BACK BY AND VERIFIED WITH: PHRMD J GADHIA $Remove'@0846'[id number]$  06/22/2018 BY S GEZAHEGN Performed at [facility], [place] [address] [place], [place] [address]    Culture STAPHYLOCOCCUS AUREUS (A)  Final   Report Status 06/24/2018 FINAL  Final   Organism ID, Bacteria STAPHYLOCOCCUS AUREUS  Final      Susceptibility   Staphylococcus aureus - MIC*    CIPROFLOXACIN <=0.5 SENSITIVE Sensitive     ERYTHROMYCIN <=0.25 SENSITIVE Sensitive     GENTAMICIN <=0.5 SENSITIVE Sensitive     OXACILLIN <=0.25 SENSITIVE Sensitive     TETRACYCLINE <=1 SENSITIVE Sensitive     VANCOMYCIN <=0.5 SENSITIVE Sensitive     TRIMETH/SULFA <=10 SENSITIVE Sensitive     CLINDAMYCIN <=0.25 SENSITIVE Sensitive     RIFAMPIN <=0.5 SENSITIVE Sensitive     Inducible Clindamycin NEGATIVE Sensitive     * STAPHYLOCOCCUS AUREUS  Urine culture     Status: Abnormal   Collection Time: 06/21/18  2:13 PM  Result Value Ref Range Status   Specimen Description   Final    URINE, RANDOM Performed at [facility] [address] [place], [place] [address]    Special Requests   Final    NONE Performed at [facility], [place] [address] [place], [place] [address]    Culture (A)  Final    10,000 COLONIES/mL DIPHTHEROIDS(CORYNEBACTERIUM SPECIES) Standardized susceptibility testing for this organism is not available. Performed at [facility], [place] [address] [place],  [address]    Report Status 06/23/2018 FINAL  Final  Blood Culture ID Panel (Reflexed)     Status: Abnormal   Collection Time: 06/21/18  2:13 PM  Result Value Ref Range Status   Enterococcus species NOT DETECTED NOT DETECTED Final   Listeria monocytogenes NOT DETECTED NOT DETECTED Final   Staphylococcus species DETECTED (A) NOT DETECTED Final    Comment: CRITICAL  RESULT CALLED TO, READ BACK BY AND VERIFIED WITH: PHRMD J GADHIA $Remove'@0846'WrubOSO$  06/22/2018 BY S GEZAHEGN    Staphylococcus aureus (BCID) DETECTED (A) NOT DETECTED Final    Comment: Methicillin (oxacillin) susceptible Staphylococcus aureus (MSSA). Preferred therapy is anti staphylococcal beta lactam antibiotic (Cefazolin or Nafcillin), unless clinically contraindicated. CRITICAL RESULT CALLED TO, READ BACK BY AND VERIFIED WITH: PHRMD J GADHIA $Remove'@0846'[id number]$  06/22/2018 BY S GEZAHEGN    Methicillin  resistance NOT DETECTED NOT DETECTED Final   Streptococcus species NOT DETECTED NOT DETECTED Final   Streptococcus agalactiae NOT DETECTED NOT DETECTED Final   Streptococcus pneumoniae NOT DETECTED NOT DETECTED Final   Streptococcus pyogenes NOT DETECTED NOT DETECTED Final   Acinetobacter baumannii NOT DETECTED NOT DETECTED Final   Enterobacteriaceae species NOT DETECTED NOT DETECTED Final   Enterobacter cloacae complex NOT DETECTED NOT DETECTED Final   Escherichia coli NOT DETECTED NOT DETECTED Final   Klebsiella oxytoca NOT DETECTED NOT DETECTED Final   Klebsiella pneumoniae NOT DETECTED NOT DETECTED Final   Proteus species NOT DETECTED NOT DETECTED Final   Serratia marcescens NOT DETECTED NOT DETECTED Final   Haemophilus influenzae NOT DETECTED NOT DETECTED Final   Neisseria meningitidis NOT DETECTED NOT DETECTED Final   Pseudomonas aeruginosa NOT DETECTED NOT DETECTED Final   Candida albicans NOT DETECTED NOT DETECTED Final   Candida glabrata NOT DETECTED NOT DETECTED Final   Candida krusei NOT DETECTED NOT DETECTED Final   Candida parapsilosis NOT DETECTED NOT DETECTED Final   Candida tropicalis NOT DETECTED NOT DETECTED Final    Comment: Performed at [facility], [place] [address] [place], [place] [address]  Culture, blood (routine x 2)     Status: None   Collection Time: 06/21/18  3:22 PM  Result Value Ref Range Status   Specimen Description   Final    BLOOD RIGHT FOREARM Performed at [facility] [address], [place], [place] [address]    Special Requests   Final    BOTTLES DRAWN AEROBIC AND ANAEROBIC Blood Culture results may not be optimal due to an excessive volume of blood received in culture bottles Performed at [facility] [address] [place], [place] [address]    Culture   Final    NO GROWTH 5 DAYS Performed at [facility], [place] [address] [place], [place] [address]    Report Status 06/26/2018 FINAL  Final  MRSA PCR Screening     Status: None   Collection Time: 06/21/18  8:48 PM  Result Value Ref Range Status   MRSA by PCR NEGATIVE NEGATIVE Final    Comment:        The GeneXpert MRSA Assay (FDA approved for NASAL specimens only), is one component of a comprehensive MRSA colonization surveillance program. It is not intended to diagnose MRSA infection nor to guide or monitor treatment for MRSA infections. Performed at [facility], [name] [address], [place], [place] [address]   Culture, blood (Routine X 2) w Reflex to ID Panel     Status: None (Preliminary result)   Collection Time: 06/23/18  1:00 PM  Result Value Ref Range Status   Specimen Description   Final    BLOOD RIGHT ANTECUBITAL Performed at [facility] [address] [place], [place] [address]    Special Requests   Final    BOTTLES DRAWN AEROBIC AND ANAEROBIC Blood Culture adequate volume Performed at [facility] [address] [place] [address], [place], [place] [address]    Culture   Final    NO GROWTH 3 DAYS Performed at [facility], [place] [address], [place],  [address]    Report Status PENDING  Incomplete  Culture, blood (routine x 2)     Status: None (Preliminary result)   Collection Time: 06/23/18  1:00 PM  Result Value Ref Range Status   Specimen Description   Final    BLOOD  RIGHT HAND Performed at [facility], [place] [address], [place], [place] [address]    Special  Requests   Final    BOTTLES DRAWN AEROBIC AND ANAEROBIC Blood Culture adequate volume Performed at [facility] [address], [place], [place] [address]    Culture   Final    NO GROWTH 3 DAYS Performed at [facility], [place] [address] [place], [place] [address]    Report Status PENDING  Incomplete    Studies/Results: No results found.   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  Would check TEE when able Will change cefepime to ancef Repeat BCx are ngtd.           Bobby Rumpf MD, FACP Infectious Diseases (pager) [phone] [email or web address] 06/26/2018, 3:01 PM  LOS: 5 days

## 2018-06-26 NOTE — Progress Notes (Signed)
Finally, the neutropenia has resolved.  His white cell count today is 4.4.  I will stop the Neupogen.  He still is in atrial fibrillation.  I do believe feel that he will need anticoagulation.  The thrombocytopenia is only temporary.  He is still in the ICU.  He is walking.  He has been getting out of bed a little bit.  His throat feels a whole lot better.  This goes along with his white cell count coming back up.  There is been no problems with vomiting.  He is a little bit of nausea.  His little bit of diarrhea, probably from the antibiotics.  He has had no fever.  I think this goes along with the antibiotic covering his MSSA.  I do not think that there is a problems with endocarditis or the Port-A-Cath involvement.  He has had no chest wall pain.  There is been a little bit of a cough.  His platelet count is 12,000.  I do not think we have to give him a transfusion as he is not bleeding.  His labs are getting better with respect to his kidney function.  His creatinine is 1.43.  His potassium is a bit low at 3.1.  His albumin is 2.5.  I find nothing on his physical exam that really is new.  His mouth does look better with less erythema in the soft palate and pharynx.  His lungs sound pretty clear.  Hopefully, he will be moved out of the ICU soon.  I still think that we are looking at another couple days with him in the hospital.  I wonder if he will be able to be put on oral antibiotics for his MSSA infection.  I would think that with the Port-A-Cath, he will need at least 2 weeks of antibiotic therapy.  [name], MD  [facility] Milian 41:10

## 2018-06-26 NOTE — Progress Notes (Signed)
Pharmacy Antibiotic Note  [name] Rose is a 68 y.o. male with a h/o stage IV bladder cancer admitted on 06/21/2018 with generalized weakness.  Bcx + MSSA.  Pharmacy had been dosing Cefazolin per ID, but per Dr. Antonieta Pert request, increase coverage to Cefepime d/t neutropenia on 3/13. WBC improving and ID has changed Cefepime back to Cefazolin, with pharmacy dosing  06/26/2018: - Renal function back to baseline, CrCl~ 70 ml/min - Afebrile - WBC improved to 4.4, ANC 2.9.  Granix 3/12 >> 3/16  Plan:  D/C Cefepime   Restart Cefazolin 2gm IV q8h  Follow up renal fxn, culture results, and clinical course.   Height: $Remove'6\' 2"'LXVEVtk$  (188 cm) Weight: 265 lb (120.2 kg) IBW/kg (Calculated) : 82.2  Temp (24hrs), Avg:98.1 F (36.7 C), Min:97.8 F (36.6 C), Max:98.6 F (37 C)  Recent Labs  Lab 06/21/18 1435  06/21/18 1717 06/22/18 0226 06/22/18 1716 06/23/18 0232 06/23/18 0647 06/24/18 0423 06/24/18 1735 06/25/18 0606 06/26/18 0313  WBC  --    < >  --  0.1*  --  0.2*  --  0.4*  --  1.0* 4.4  CREATININE  --    < >  --  1.56*  --  1.56*  --  1.67* 1.61* 1.54* 1.43*  LATICACIDVEN 3.1*  --  2.5*  --  2.7*  --  1.8  --   --   --   --    < > = values in this interval not displayed.    Estimated Creatinine Clearance: 70 mL/min (A) (by C-G formula based on SCr of 1.43 mg/dL (H)).    No Known Allergies  Antimicrobials this admission: 3/11 cefepime >> 3/12 3/11 vanc >> 3/12 3/12 Ancef>> 3/13 3/13 Cefepime >> 3/16 3/16 Cefazolin >>   Dose adjustments this admission:   Microbiology results: 3/11 BCx: Staph aureus      3/12 BCID:  MSSA  3/11 UCx:  Diphtheroids (Corynebacterium species) 3/11 MRSA PCR: neg 3/13 BCx: NGTD   Thank you for allowing pharmacy to be a part of this patient's care.  Leone Haven, PharmD 06/26/2018 3:36 PM

## 2018-06-26 NOTE — Progress Notes (Signed)
Pharmacy Antibiotic Note  [name] Rose is a 68 y.o. male with a h/o stage IV bladder cancer admitted on 06/21/2018 with generalized weakness.  Bcx + MSSA.  Pharmacy had been dosing Cefazolin per ID, but per Dr. Antonieta Pert request, increase coverage to Cefepime d/t neutropenia.  06/26/2018: - Renal function back to baseline, CrCl~ 70 ml/min - Afebrile - WBC improved to 4.4, ANC 2.9.  Granix 3/12 >> 3/16  Plan:  Cefepime 2gm IV q8h  Follow up renal fxn, culture results, and clinical course.  F/u ability to de-escalate antibiotics.   Height: $Remove'6\' 2"'YoMOnuw$  (188 cm) Weight: 265 lb (120.2 kg) IBW/kg (Calculated) : 82.2  Temp (24hrs), Avg:98.2 F (36.8 C), Min:97.8 F (36.6 C), Max:98.4 F (36.9 C)  Recent Labs  Lab 06/21/18 1435  06/21/18 1717 06/22/18 0226 06/22/18 1716 06/23/18 0232 06/23/18 0647 06/24/18 0423 06/24/18 1735 06/25/18 0606 06/26/18 0313  WBC  --    < >  --  0.1*  --  0.2*  --  0.4*  --  1.0* 4.4  CREATININE  --    < >  --  1.56*  --  1.56*  --  1.67* 1.61* 1.54* 1.43*  LATICACIDVEN 3.1*  --  2.5*  --  2.7*  --  1.8  --   --   --   --    < > = values in this interval not displayed.    Estimated Creatinine Clearance: 70 mL/min (A) (by C-G formula based on SCr of 1.43 mg/dL (H)).    No Known Allergies  Antimicrobials this admission: 3/11 cefepime >> 3/12 3/11 vanc >> 3/12 3/12 Ancef>> 3/13 3/13 Cefepime >>    Dose adjustments this admission:   Microbiology results: 3/11 BCx: Staph aureus      3/12 BCID:  MSSA  3/11 UCx:   3/11 MRSA PCR: neg   Thank you for allowing pharmacy to be a part of this patient's care.  Gretta Arab PharmD, BCPS Pager [phone] 06/26/2018 8:37 AM

## 2018-06-26 NOTE — Progress Notes (Signed)
PROGRESS NOTE    [name] Rose  SPQ:[id number] DOB: [birth year] DOA: 06/21/2018 PCP: Shirline Frees, MD  Brief Narrative:68 y.o.malewith medical history significant ofstage IV bladder cancer going through chemotherapy his last chemotherapy was 224 2028 days prior to admission to the hospital. Patient reports usually the second week after the chemotherapy he feels very weak but this is the first week after chemotherapy he has no appetite very decreased p.o. intake with lightheadedness and dizziness upon standing. He is also not able to eat due to ulcers in his mouth. Low-grade temp at home. He does complain of nausea but no vomiting diarrhea or constipation. Denies any cough shortness of breath. He reported hematuria at home has not urinated since coming to the ER.    ED Course:Patient received cefepime sodium 136 potassium 4.1 BUN 58 creatinine 1.69 anion gap is 13 magnesium 1.5 lipase 27 total bilirubin 1.8 lactic acid level was 3.1 white count was 0.2 hemoglobin 10.7 platelet count 10. EKG in the ER rapid A. fib   Assessment & Plan:   Active Problems:   Neutropenic fever (HCC)   New onset a-fib (HCC)   Mucositis due to chemotherapy   AKI (acute kidney injury) ([place])   Pancytopenia (HCC)   MSSA bacteremia   Essential hypertension   Hyperglycemia   Mild hyperlipidemia   Bacteremia #1 neutropenic fever/sepsis inin the setting off stage IV bladder cancer status post recent chemo.Patient presented with hypotension tachycardia and fever and severe neutropenia and thrombocytopenia. Blood culture with MSSA bacteremia. DC vancomycin continue cefepime.  Transthoracic echo ejection fraction 55 to 60%.  Moderate thickening of the aortic valve probably mild calcification of the valve no vegetations detected.  Will defer to infectious disease to decide whether patient needs a TEE.    #2 pancytopenia secondary to recent chemo hemoglobin 8.2 today getting blood transfusion.   Platelet count 12,000.  Transfuse if bleeding or less than 10,000.  #3 mucositis improving continue Magic mouthwash.    #4  Hypertension letter on Cardizem beta-blocker.     #5 new onset atrial fibrillationrate controlled on Cardizem beta-blocker.  Will defer to cardiology if anticoagulation must be started with severe thrombocytopenia though its transient secondary to chemo.  #6 AKI back to baseline with IV fluids   #7 hypomagnesemia hypokalemia replete  DVT prophylaxis:SCD Code Status:Full code Family Communication:Discussed with wife in the room Disposition Plan:Pending clinical improvement    Estimated body mass index is 34.02 kg/m as calculated from the following:   Height as of this encounter: $RemoveBeforeD'6\' 2"'[id number]$  (1.88 m).   Weight as of this encounter: 120.2 kg.    Subjective: Wife by the bedside patient has no new complaints he feels better did have some nausea but no vomiting he had some loose stools on a stool softener  Objective: Vitals:   06/26/18 1000 06/26/18 1004 06/26/18 1021 06/26/18 1200  BP: (!) 158/92 (!) 158/92 138/84   Pulse: 80  86   Resp: (!) 21  (!) 22   Temp: 97.8 F (36.6 C) 97.8 F (36.6 C) 98.1 F (36.7 C) 98.2 F (36.8 C)  TempSrc: Oral Oral Oral Oral  SpO2: 96%  92%   Weight:      Height:        Intake/Output Summary (Last 24 hours) at 06/26/2018 1218 Last data filed at 06/26/2018 0926 Gross per 24 hour  Intake 2254.15 ml  Output 1275 ml  Net 979.15 ml   Filed Weights   06/21/18 1418  Weight: 120.2 kg  Examination: Oral mucosa with decreasing erythema General exam: Appears calm and comfortable  Respiratory system: Clear to auscultation. Respiratory effort normal. Cardiovascular system: S1 & S2 heard, RRR. No JVD, murmurs, rubs, gallops or clicks. No pedal edema. Gastrointestinal system: Abdomen is nondistended, soft and nontender. No organomegaly or masses felt. Normal bowel sounds heard. Central nervous system: Alert and  oriented. No focal neurological deficits. Extremities: Symmetric 5 x 5 power. Skin: No rashes, lesions or ulcers Psychiatry: Judgement and insight appear normal. Mood & affect appropriate.     Data Reviewed: I have personally reviewed following labs and imaging studies  CBC: Recent Labs  Lab 06/21/18 1438 06/22/18 0226 06/23/18 0232 06/24/18 0423 06/25/18 0606 06/26/18 0313  WBC 0.2* 0.1* 0.2* 0.4* 1.0* 4.4  NEUTROABS 0.0*  --   --  TOO FEW TO COUNT, SMEAR AVAILABLE FOR REVIEW TOO FEW TO COUNT, SMEAR AVAILABLE FOR REVIEW 2.9  HGB 10.7* 7.8* 9.6* 9.3* 7.8* 8.2*  HCT 32.1* 24.7* 29.8* 28.0* 24.3* 25.0*  MCV 94.4 98.0 93.1 93.0 95.7 94.0  PLT 10* 28* 15* 6* <5* 12*   Basic Metabolic Panel: Recent Labs  Lab 06/21/18 1438 06/22/18 0226 06/23/18 0232 06/24/18 0423 06/24/18 0612 06/24/18 1735 06/25/18 0606 06/26/18 0313  NA 136 138 138 136  --  135 138 137  K 4.1 3.4* 3.0* 2.6*  --  2.9* 3.0* 3.1*  CL 97* 104 101 100  --  102 103 103  CO2 $Re'26 23 26 23  'Pyo$ --  $R'25 24 24  'Zc$ GLUCOSE 213* 158* 126* 120*  --  112* 103* 104*  BUN 58* 48* 36* 43*  --  46* 44* 46*  CREATININE 1.69* 1.56* 1.56* 1.67*  --  1.61* 1.54* 1.43*  CALCIUM 9.0 8.1* 8.1* 8.2*  --  7.8* 7.9* 7.8*  MG 1.5* 1.5*  --  1.2*  --   --  1.6* 1.6*  PHOS  --   --   --   --  1.9*  --   --   --    GFR: Estimated Creatinine Clearance: 70 mL/min (A) (by C-G formula based on SCr of 1.43 mg/dL (H)). Liver Function Tests: Recent Labs  Lab 06/22/18 0226 06/23/18 0232 06/24/18 0423 06/25/18 0606 06/26/18 0313  AST 17 14* 16 12* 15  ALT $Re'21 18 10 10 15  'CbP$ ALKPHOS 42 35* 31* 30* 39  BILITOT 1.4* 1.2 1.3* 1.0 0.8  PROT 5.1* 5.3* 5.0* 4.6* 4.9*  ALBUMIN 2.9* 2.9* 2.5* 2.2* 2.5*   Recent Labs  Lab 06/21/18 1438  LIPASE 27   No results for input(s): AMMONIA in the last 168 hours. Coagulation Profile: No results for input(s): INR, PROTIME in the last 168 hours. Cardiac Enzymes: Recent Labs  Lab 06/21/18 1438   TROPONINI <0.03   BNP (last 3 results) No results for input(s): PROBNP in the last 8760 hours. HbA1C: No results for input(s): HGBA1C in the last 72 hours. CBG: Recent Labs  Lab 06/24/18 0434  GLUCAP 109*   Lipid Profile: No results for input(s): CHOL, HDL, LDLCALC, TRIG, CHOLHDL, LDLDIRECT in the last 72 hours. Thyroid Function Tests: No results for input(s): TSH, T4TOTAL, FREET4, T3FREE, THYROIDAB in the last 72 hours. Anemia Panel: No results for input(s): VITAMINB12, FOLATE, FERRITIN, TIBC, IRON, RETICCTPCT in the last 72 hours. Sepsis Labs: Recent Labs  Lab 06/21/18 1435 06/21/18 1717 06/21/18 1732 06/22/18 0226 06/22/18 1716 06/23/18 0232 06/23/18 0647  PROCALCITON  --   --  1.08 2.81  --  17.47  --  LATICACIDVEN 3.1* 2.5*  --   --  2.7*  --  1.8    Recent Results (from the past 240 hour(s))  Culture, blood (routine x 2)     Status: Abnormal   Collection Time: 06/21/18  2:13 PM  Result Value Ref Range Status   Specimen Description   Final    BLOOD RIGHT ANTECUBITAL Performed at [facility] [address] [place], [place] [address]    Special Requests   Final    BOTTLES DRAWN AEROBIC AND ANAEROBIC Blood Culture results may not be optimal due to an excessive volume of blood received in culture bottles Performed at [facility] [address] [place], [place] [address]    Culture  Setup Time   Final    GRAM POSITIVE COCCI CRITICAL RESULT CALLED TO, READ BACK BY AND VERIFIED WITH: PHRMD J GADHIA $Remove'@0846'[id number]$  06/22/2018 BY S GEZAHEGN Performed at [facility], [place] [address], [place], [place] [address]    Culture STAPHYLOCOCCUS AUREUS (A)  Final   Report Status 06/24/2018 FINAL  Final   Organism ID, Bacteria STAPHYLOCOCCUS AUREUS  Final      Susceptibility   Staphylococcus aureus - MIC*    CIPROFLOXACIN <=0.5 SENSITIVE Sensitive     ERYTHROMYCIN <=0.25 SENSITIVE Sensitive     GENTAMICIN <=0.5 SENSITIVE Sensitive      OXACILLIN <=0.25 SENSITIVE Sensitive     TETRACYCLINE <=1 SENSITIVE Sensitive     VANCOMYCIN <=0.5 SENSITIVE Sensitive     TRIMETH/SULFA <=10 SENSITIVE Sensitive     CLINDAMYCIN <=0.25 SENSITIVE Sensitive     RIFAMPIN <=0.5 SENSITIVE Sensitive     Inducible Clindamycin NEGATIVE Sensitive     * STAPHYLOCOCCUS AUREUS  Urine culture     Status: Abnormal   Collection Time: 06/21/18  2:13 PM  Result Value Ref Range Status   Specimen Description   Final    URINE, RANDOM Performed at [facility] [address] [place], [place] [address]    Special Requests   Final    NONE Performed at [facility], [place] [address] [place], [place] [address]    Culture (A)  Final    10,000 COLONIES/mL DIPHTHEROIDS(CORYNEBACTERIUM SPECIES) Standardized susceptibility testing for this organism is not available. Performed at [facility], [place] [address] [place],  [address]    Report Status 06/23/2018 FINAL  Final  Blood Culture ID Panel (Reflexed)     Status: Abnormal   Collection Time: 06/21/18  2:13 PM  Result Value Ref Range Status   Enterococcus species NOT DETECTED NOT DETECTED Final   Listeria monocytogenes NOT DETECTED NOT DETECTED Final   Staphylococcus species DETECTED (A) NOT DETECTED Final    Comment: CRITICAL RESULT CALLED TO, READ BACK BY AND VERIFIED WITH: PHRMD J GADHIA $Remove'@0846'[email or web address]$  06/22/2018 BY S GEZAHEGN    Staphylococcus aureus (BCID) DETECTED (A) NOT DETECTED Final    Comment: Methicillin (oxacillin) susceptible Staphylococcus aureus (MSSA). Preferred therapy is anti staphylococcal beta lactam antibiotic (Cefazolin or Nafcillin), unless clinically contraindicated. CRITICAL RESULT CALLED TO, READ BACK BY AND VERIFIED WITH: PHRMD J GADHIA $Remove'@0846'[id number]$  06/22/2018 BY S GEZAHEGN    Methicillin resistance NOT DETECTED NOT DETECTED Final   Streptococcus species NOT DETECTED NOT DETECTED Final   Streptococcus agalactiae NOT DETECTED NOT DETECTED Final    Streptococcus pneumoniae NOT DETECTED NOT DETECTED Final   Streptococcus pyogenes NOT DETECTED NOT DETECTED Final   Acinetobacter baumannii NOT DETECTED NOT DETECTED Final   Enterobacteriaceae  species NOT DETECTED NOT DETECTED Final   Enterobacter cloacae complex NOT DETECTED NOT DETECTED Final   Escherichia coli NOT DETECTED NOT DETECTED Final   Klebsiella oxytoca NOT DETECTED NOT DETECTED Final   Klebsiella pneumoniae NOT DETECTED NOT DETECTED Final   Proteus species NOT DETECTED NOT DETECTED Final   Serratia marcescens NOT DETECTED NOT DETECTED Final   Haemophilus influenzae NOT DETECTED NOT DETECTED Final   Neisseria meningitidis NOT DETECTED NOT DETECTED Final   Pseudomonas aeruginosa NOT DETECTED NOT DETECTED Final   Candida albicans NOT DETECTED NOT DETECTED Final   Candida glabrata NOT DETECTED NOT DETECTED Final   Candida krusei NOT DETECTED NOT DETECTED Final   Candida parapsilosis NOT DETECTED NOT DETECTED Final   Candida tropicalis NOT DETECTED NOT DETECTED Final    Comment: Performed at [facility], [place] [address] [place], [place] [address]  Culture, blood (routine x 2)     Status: None   Collection Time: 06/21/18  3:22 PM  Result Value Ref Range Status   Specimen Description   Final    BLOOD RIGHT FOREARM Performed at [facility] [address] [place], [place] [address]    Special Requests   Final    BOTTLES DRAWN AEROBIC AND ANAEROBIC Blood Culture results may not be optimal due to an excessive volume of blood received in culture bottles Performed at [facility] [address] [place], [place] [address]    Culture   Final    NO GROWTH 5 DAYS Performed at [facility], [place] [address], [place], [place] [address]    Report Status 06/26/2018 FINAL  Final  MRSA PCR Screening     Status: None   Collection Time: 06/21/18  8:48 PM  Result Value Ref Range Status   MRSA by PCR NEGATIVE NEGATIVE Final    Comment:         The GeneXpert MRSA Assay (FDA approved for NASAL specimens only), is one component of a comprehensive MRSA colonization surveillance program. It is not intended to diagnose MRSA infection nor to guide or monitor treatment for MRSA infections. Performed at [facility], [place] [address] [place], [place] [address]   Culture, blood (Routine X 2) w Reflex to ID Panel     Status: None (Preliminary result)   Collection Time: 06/23/18  1:00 PM  Result Value Ref Range Status   Specimen Description   Final    BLOOD RIGHT ANTECUBITAL Performed at [facility] [address], [place], [place] [address]    Special Requests   Final    BOTTLES DRAWN AEROBIC AND ANAEROBIC Blood Culture adequate volume Performed at [facility] [address] [place], [place] [address]    Culture   Final    NO GROWTH 3 DAYS Performed at [facility], [place] [address], [place], [place] [address]    Report Status PENDING  Incomplete  Culture, blood (routine x 2)     Status: None (Preliminary result)   Collection Time: 06/23/18  1:00 PM  Result Value Ref Range Status   Specimen Description   Final    BLOOD RIGHT HAND Performed at [facility], [place] [address], [place], [place] [address]    Special Requests   Final    BOTTLES DRAWN AEROBIC AND ANAEROBIC Blood Culture adequate volume Performed at [facility] [address] [place], [place] [address]    Culture  Final    NO GROWTH 3 DAYS Performed at [facility], [place] [address], [place], [place] [address]    Report Status PENDING  Incomplete         Radiology Studies: No results found.      Scheduled Meds: . sodium chloride   Intravenous Once  . bisacodyl  10 mg Oral Daily  . chlorhexidine  15 mL Mouth/Throat QID  . Chlorhexidine Gluconate Cloth  6 each Topical Daily  . diltiazem  240 mg Oral Daily  . feeding supplement (ENSURE ENLIVE)  237  mL Oral TID BM  . furosemide  20 mg Intravenous Once  . furosemide  20 mg Intravenous Once  . loratadine  10 mg Oral Daily  . magic mouthwash w/lidocaine  5 mL Oral QID  . metoprolol tartrate  25 mg Oral BID  . promethazine  12.5 mg Intravenous Once  . sodium bicarbonate/sodium chloride   Mouth Rinse Q4H  . sodium chloride flush  10-40 mL Intracatheter Q12H   Continuous Infusions: . sodium chloride Stopped (06/24/18 0611)  . sodium chloride 75 mL/hr at 06/26/18 0846  . ceFEPime (MAXIPIME) IV Stopped (06/26/18 0800)  . potassium chloride 10 mEq (06/26/18 1124)     LOS: 5 days     Georgette Shell, MD [facility] If 7PM-7AM, please contact night-coverage [email or web address] Password [facility] 06/26/2018, 12:18 PM

## 2018-06-27 ENCOUNTER — Inpatient Hospital Stay (HOSPITAL_COMMUNITY): Payer: PPO

## 2018-06-27 ENCOUNTER — Encounter (HOSPITAL_COMMUNITY)
Admission: EM | Disposition: A | Discharge status: Home Health | Payer: Self-pay | Source: Home / Self Care | Attending: Internal Medicine

## 2018-06-27 ENCOUNTER — Encounter (HOSPITAL_COMMUNITY): Payer: Self-pay | Admitting: *Deleted

## 2018-06-27 DIAGNOSIS — I10 Essential (primary) hypertension: Secondary | ICD-10-CM

## 2018-06-27 DIAGNOSIS — L270 Generalized skin eruption due to drugs and medicaments taken internally: Secondary | ICD-10-CM

## 2018-06-27 DIAGNOSIS — I480 Paroxysmal atrial fibrillation: Secondary | ICD-10-CM

## 2018-06-27 DIAGNOSIS — R21 Rash and other nonspecific skin eruption: Secondary | ICD-10-CM

## 2018-06-27 HISTORY — PX: TEE WITHOUT CARDIOVERSION: SHX5443

## 2018-06-27 LAB — TYPE AND SCREEN
ABO/RH(D): A POS
Antibody Screen: NEGATIVE
Unit division: 0
Unit division: 0

## 2018-06-27 LAB — BPAM RBC
Blood Product Expiration Date: 202004032359
Blood Product Expiration Date: 202004042359
ISSUE DATE / TIME: 202003160955
ISSUE DATE / TIME: 202003161311
Unit Type and Rh: 6200
Unit Type and Rh: 6200

## 2018-06-27 LAB — CBC WITH DIFFERENTIAL/PLATELET
Abs Immature Granulocytes: 1.53 10*3/uL — ABNORMAL HIGH (ref 0.00–0.07)
Basophils Absolute: 0.1 10*3/uL (ref 0.0–0.1)
Basophils Relative: 1 %
Eosinophils Absolute: 0.1 10*3/uL (ref 0.0–0.5)
Eosinophils Relative: 1 %
HCT: 33.7 % — ABNORMAL LOW (ref 39.0–52.0)
Hemoglobin: 10.9 g/dL — ABNORMAL LOW (ref 13.0–17.0)
Immature Granulocytes: 10 %
Lymphocytes Relative: 7 %
Lymphs Abs: 1.1 10*3/uL (ref 0.7–4.0)
MCH: 30 pg (ref 26.0–34.0)
MCHC: 32.3 g/dL (ref 30.0–36.0)
MCV: 92.8 fL (ref 80.0–100.0)
Monocytes Absolute: 1.4 10*3/uL — ABNORMAL HIGH (ref 0.1–1.0)
Monocytes Relative: 9 %
Neutro Abs: 10.9 10*3/uL — ABNORMAL HIGH (ref 1.7–7.7)
Neutrophils Relative %: 72 %
Platelets: 11 10*3/uL — CL (ref 150–400)
RBC: 3.63 MIL/uL — ABNORMAL LOW (ref 4.22–5.81)
RDW: 14.8 % (ref 11.5–15.5)
WBC: 15.1 10*3/uL — ABNORMAL HIGH (ref 4.0–10.5)
nRBC: 0 % (ref 0.0–0.2)

## 2018-06-27 LAB — COMPREHENSIVE METABOLIC PANEL
ALT: 18 U/L (ref 0–44)
AST: 18 U/L (ref 15–41)
Albumin: 2.5 g/dL — ABNORMAL LOW (ref 3.5–5.0)
Alkaline Phosphatase: 67 U/L (ref 38–126)
Anion gap: 9 (ref 5–15)
BUN: 37 mg/dL — ABNORMAL HIGH (ref 8–23)
CO2: 29 mmol/L (ref 22–32)
Calcium: 8 mg/dL — ABNORMAL LOW (ref 8.9–10.3)
Chloride: 98 mmol/L (ref 98–111)
Creatinine, Ser: 1.43 mg/dL — ABNORMAL HIGH (ref 0.61–1.24)
GFR calc Af Amer: 59 mL/min — ABNORMAL LOW (ref >60)
GFR calc non Af Amer: 51 mL/min — ABNORMAL LOW (ref >60)
Glucose, Bld: 105 mg/dL — ABNORMAL HIGH (ref 70–99)
Potassium: 3.1 mmol/L — ABNORMAL LOW (ref 3.5–5.1)
Sodium: 136 mmol/L (ref 135–145)
Total Bilirubin: 0.7 mg/dL (ref 0.3–1.2)
Total Protein: 5.2 g/dL — ABNORMAL LOW (ref 6.5–8.1)

## 2018-06-27 LAB — MAGNESIUM: Magnesium: 1.3 mg/dL — ABNORMAL LOW (ref 1.7–2.4)

## 2018-06-27 LAB — CK: Total CK: 9 U/L — ABNORMAL LOW (ref 49–397)

## 2018-06-27 SURGERY — ECHOCARDIOGRAM, TRANSESOPHAGEAL
Anesthesia: Moderate Sedation

## 2018-06-27 MED ORDER — POTASSIUM CHLORIDE CRYS ER 20 MEQ PO TBCR
40.0000 meq | EXTENDED_RELEASE_TABLET | Freq: Once | ORAL | Status: AC
  Administered 2018-06-27: 40 meq via ORAL
  Filled 2018-06-27: qty 2

## 2018-06-27 MED ORDER — DIPHENHYDRAMINE HCL 50 MG/ML IJ SOLN
INTRAMUSCULAR | Status: AC
  Filled 2018-06-27: qty 1

## 2018-06-27 MED ORDER — SODIUM CHLORIDE 0.9 % IV SOLN
6.0000 mg/kg | Freq: Every day | INTRAVENOUS | Status: DC
  Administered 2018-06-27: 721 mg via INTRAVENOUS
  Filled 2018-06-27: qty 14.42

## 2018-06-27 MED ORDER — DIPHENHYDRAMINE HCL 25 MG PO CAPS
25.0000 mg | ORAL_CAPSULE | Freq: Four times a day (QID) | ORAL | Status: DC | PRN
  Administered 2018-06-27 – 2018-06-29 (×3): 25 mg via ORAL
  Filled 2018-06-27 (×3): qty 1

## 2018-06-27 MED ORDER — MIDAZOLAM HCL (PF) 5 MG/ML IJ SOLN
INTRAMUSCULAR | Status: AC
  Filled 2018-06-27: qty 2

## 2018-06-27 MED ORDER — SODIUM CHLORIDE 0.9 % IV SOLN
510.0000 mg | Freq: Once | INTRAVENOUS | Status: AC
  Administered 2018-06-27: 510 mg via INTRAVENOUS
  Filled 2018-06-27: qty 17

## 2018-06-27 MED ORDER — SODIUM CHLORIDE 0.9 % IV SOLN: INTRAVENOUS | Status: DC

## 2018-06-27 MED ORDER — FENTANYL CITRATE (PF) 100 MCG/2ML IJ SOLN
INTRAMUSCULAR | Status: AC
  Filled 2018-06-27: qty 2

## 2018-06-27 MED ORDER — POTASSIUM CHLORIDE 10 MEQ/100ML IV SOLN
10.0000 meq | INTRAVENOUS | Status: AC
  Administered 2018-06-27 (×2): 10 meq via INTRAVENOUS
  Filled 2018-06-27 (×2): qty 100

## 2018-06-27 MED ORDER — MAGNESIUM OXIDE 400 (241.3 MG) MG PO TABS
800.0000 mg | ORAL_TABLET | Freq: Two times a day (BID) | ORAL | Status: DC
  Administered 2018-06-27 – 2018-06-29 (×4): 800 mg via ORAL
  Filled 2018-06-27 (×4): qty 2

## 2018-06-27 MED ORDER — FENTANYL CITRATE (PF) 100 MCG/2ML IJ SOLN
INTRAMUSCULAR | Status: DC | PRN
  Administered 2018-06-27 (×4): 25 ug via INTRAVENOUS

## 2018-06-27 MED ORDER — POTASSIUM CHLORIDE CRYS ER 20 MEQ PO TBCR
20.0000 meq | EXTENDED_RELEASE_TABLET | Freq: Two times a day (BID) | ORAL | Status: DC
  Administered 2018-06-27 – 2018-06-29 (×3): 20 meq via ORAL
  Filled 2018-06-27 (×3): qty 1

## 2018-06-27 MED ORDER — MAGNESIUM SULFATE 2 GM/50ML IV SOLN
2.0000 g | Freq: Once | INTRAVENOUS | Status: AC
  Administered 2018-06-27: 2 g via INTRAVENOUS
  Filled 2018-06-27: qty 50

## 2018-06-27 MED ORDER — BUTAMBEN-TETRACAINE-BENZOCAINE 2-2-14 % EX AERO
INHALATION_SPRAY | CUTANEOUS | Status: DC | PRN
  Administered 2018-06-27: 2 via TOPICAL
  Administered 2018-06-27: 1 via TOPICAL

## 2018-06-27 MED ORDER — FOLIC ACID 1 MG PO TABS
2.0000 mg | ORAL_TABLET | Freq: Every day | ORAL | Status: DC
  Administered 2018-06-27 – 2018-06-29 (×3): 2 mg via ORAL
  Filled 2018-06-27 (×3): qty 2

## 2018-06-27 MED ORDER — MIDAZOLAM HCL (PF) 10 MG/2ML IJ SOLN
INTRAMUSCULAR | Status: DC | PRN
  Administered 2018-06-27: 2 mg via INTRAVENOUS
  Administered 2018-06-27 (×3): 1 mg via INTRAVENOUS

## 2018-06-27 NOTE — Progress Notes (Signed)
Spoke with Marden Noble at [facility] who will have the pt to [facility] by 1100 today for a TEE with Dr. Einar Gip. Pt's nurse made aware. Jobe Igo, RN

## 2018-06-27 NOTE — Progress Notes (Signed)
[name] Rose [id number] transferred to [facility] by [facility]link for TEE. Report was called to [facility] on 4E. Patient will be returning to 1404.

## 2018-06-27 NOTE — Progress Notes (Signed)
[name] Rose is doing great.  His white cell count is now up to 15,000.  He is on Ancef for the MSSA.  Hopefully, he might will be switched to something oral for outpatient use.  He still feels little bit weak.  He really needs to get out of bed more.  Hopefully, he will be able to have some assistance and try to get out of bed.  His mouth feels a whole lot better.  This comes with the improved white cell count.  We will hopefully see him eat better now.  He still has atrial fibrillation.  It seems to be well controlled.  He is having PVCs.  He still is markedly thrombocytopenic.  He is not bleeding.  I would not transfuse him.  I will give him a dose of iron.  I will also give him some folic acid.  I think this may help his red blood cells a little bit.  He has had no fever.  He has had no diarrhea.  On his physical exam, his temperature is 97.8.  Pulse 78.  Blood pressure 125/78.  His oral exam shows no mucositis.  There is very little erythema in the soft palate and pharynx.  There is no adenopathy in the neck.  Lungs are clear bilaterally.  Cardiac exam regular rate and rhythm consistent with the atrial fibrillation.  Abdomen is soft.  He has good bowel sounds.  There is no fluid wave.  There is no palpable liver or spleen tip.  Extremities shows no clubbing, cyanosis or edema.  Skin exam shows no rashes.  Neurological exam is nonfocal.  Hopefully, [name] Rose will be moved out of the ICU today.  His neutropenia has resolved.  He still is on antibiotics for the MSSA.  I suspect he probably will need antibiotic therapy for a couple weeks because of the Port-A-Cath.  Hopefully, he will be discharged home in a couple days.  He has gotten fantastic care from everybody in the ICU.   [name], MD  Darlyn Chamber 29:11

## 2018-06-27 NOTE — Progress Notes (Signed)
  Echocardiogram Echocardiogram Transesophageal has been performed.  Randa Lynn Jerelyn Trimarco 06/27/2018, 2:14 PM

## 2018-06-27 NOTE — Progress Notes (Signed)
Patient arrived to the unit via [facility] after returning from [facility] after completing a TEE. Patient is stable. He has no complaints. Telemetry applied and patient oriented to the unit. Will continue to monitor.

## 2018-06-27 NOTE — CV Procedure (Addendum)
TEE: Under moderate sedation, TEE was performed without complications: LV: Normal. Normal EF. RV: Normal LA: Normal. Left atrial appendage: Normal without thrombus. Normal function. Inter atrial septum is intact without defect.  RA: Normal  MV Mod MR. No vegetation. TV: Normal No vegetation AV: Normal No vegetation PV: Normal No vegetation Central venous catheter tip seen in RA with no vegetation.  Thoracic and ascending aorta: Normal without significant plaque or atheromatous changes.  Conscious sedation protocol was followed, I personally administered conscious sedation and monitored the patient. Patient received 5 milligrams of Versed and 100 mcg . Patient tolerated the procedure well and there was no complication from conscious sedation. Time administered was 34 and procedure ended at 12:45 PM.

## 2018-06-27 NOTE — Progress Notes (Signed)
INFECTIOUS DISEASE PROGRESS NOTE  ID: [name] Rose is a 68 y.o. male with  Active Problems:   Neutropenic fever ([place])   New onset a-fib ([place])   Mucositis due to chemotherapy   AKI (acute kidney injury) ([place])   Pancytopenia ([place])   MSSA bacteremia   Essential hypertension   Hyperglycemia   Mild hyperlipidemia   Bacteremia  Subjective: No complaints.   Abtx:  Anti-infectives (From admission, onward)   Start     Dose/Rate Route Frequency Ordered Stop   06/26/18 1600  ceFAZolin (ANCEF) IVPB 2g/100 mL premix     2 g 200 mL/hr over 30 Minutes Intravenous Every 8 hours 06/26/18 1523     06/23/18 1600  ceFEPIme (MAXIPIME) 2 g in sodium chloride 0.9 % 100 mL IVPB  Status:  Discontinued     2 g 200 mL/hr over 30 Minutes Intravenous Every 8 hours 06/23/18 1012 06/26/18 1513   06/22/18 1800  vancomycin (VANCOCIN) 1,250 mg in sodium chloride 0.9 % 250 mL IVPB  Status:  Discontinued     1,250 mg 166.7 mL/hr over 90 Minutes Intravenous Every 24 hours 06/21/18 1740 06/22/18 1245   06/22/18 1600  ceFAZolin (ANCEF) IVPB 2g/100 mL premix  Status:  Discontinued     2 g 200 mL/hr over 30 Minutes Intravenous Every 8 hours 06/22/18 1432 06/23/18 1003   06/22/18 1400  ceFEPIme (MAXIPIME) 2 g in sodium chloride 0.9 % 100 mL IVPB  Status:  Discontinued     2 g 200 mL/hr over 30 Minutes Intravenous Every 8 hours 06/22/18 1358 06/22/18 1432   06/22/18 0400  ceFEPIme (MAXIPIME) 2 g in sodium chloride 0.9 % 100 mL IVPB  Status:  Discontinued     2 g 200 mL/hr over 30 Minutes Intravenous Every 12 hours 06/21/18 1740 06/22/18 1358   06/21/18 1745  vancomycin (VANCOCIN) 2,000 mg in sodium chloride 0.9 % 500 mL IVPB     2,000 mg 250 mL/hr over 120 Minutes Intravenous  Once 06/21/18 1737 06/21/18 2054   06/21/18 1515  ceFEPIme (MAXIPIME) 2 g in sodium chloride 0.9 % 100 mL IVPB     2 g 200 mL/hr over 30 Minutes Intravenous  Once 06/21/18 1500 06/21/18 1631      Medications:  Scheduled: .  sodium chloride   Intravenous Once  . chlorhexidine  15 mL Mouth/Throat QID  . Chlorhexidine Gluconate Cloth  6 each Topical Daily  . diltiazem  240 mg Oral Daily  . feeding supplement (ENSURE ENLIVE)  237 mL Oral TID BM  . folic acid  2 mg Oral Daily  . loratadine  10 mg Oral Daily  . magic mouthwash w/lidocaine  5 mL Oral QID  . magnesium oxide  800 mg Oral BID  . metoprolol tartrate  25 mg Oral BID  . potassium chloride  20 mEq Oral BID  . promethazine  12.5 mg Intravenous Once  . sodium bicarbonate/sodium chloride   Mouth Rinse Q4H  . sodium chloride flush  10-40 mL Intracatheter Q12H    Objective: Vital signs in last 24 hours: Temp:  [97.8 F (36.6 C)-98.6 F (37 C)] 98.6 F (37 C) (03/17 1412) Pulse Rate:  [75-115] 88 (03/17 1412) Resp:  [18-29] 19 (03/17 1412) BP: (107-172)/(46-101) 150/81 (03/17 1412) SpO2:  [88 %-98 %] 95 % (03/17 1412)   General appearance: alert, cooperative and no distress Resp: clear to auscultation bilaterally Chest wall: no tenderness, port site clean.  Cardio: regular rate and rhythm GI: normal  findings: bowel sounds normal and soft, non-tender Skin: macular - generalized, confluecnt on back  Lab Results Recent Labs    06/26/18 0313 06/26/18 2033 06/27/18 0628  WBC 4.4 12.2* 15.1*  HGB 8.2* 10.5* 10.9*  HCT 25.0* 31.9* 33.7*  NA 137  --  136  K 3.1*  --  3.1*  CL 103  --  98  CO2 24  --  29  BUN 46*  --  37*  CREATININE 1.43*  --  1.43*   Liver Panel Recent Labs    06/26/18 0313 06/27/18 0628  PROT 4.9* 5.2*  ALBUMIN 2.5* 2.5*  AST 15 18  ALT 15 18  ALKPHOS 39 67  BILITOT 0.8 0.7   Sedimentation Rate No results for input(s): ESRSEDRATE in the last 72 hours. C-Reactive Protein No results for input(s): CRP in the last 72 hours.  Microbiology: Recent Results (from the past 240 hour(s))  Culture, blood (routine x 2)     Status: Abnormal   Collection Time: 06/21/18  2:13 PM  Result Value Ref Range Status   Specimen  Description   Final    BLOOD RIGHT ANTECUBITAL Performed at [facility] [address], [place], [place] [address]    Special Requests   Final    BOTTLES DRAWN AEROBIC AND ANAEROBIC Blood Culture results may not be optimal due to an excessive volume of blood received in culture bottles Performed at [facility] [address], [place], [place] [address]    Culture  Setup Time   Final    GRAM POSITIVE COCCI CRITICAL RESULT CALLED TO, READ BACK BY AND VERIFIED WITH: PHRMD J GADHIA _0  06/22/2018 BY S GEZAHEGN Performed at [facility], [place] [address] [place], [place] [address]    Culture STAPHYLOCOCCUS AUREUS (A)  Final   Report Status 06/24/2018 FINAL  Final   Organism ID, Bacteria STAPHYLOCOCCUS AUREUS  Final      Susceptibility   Staphylococcus aureus - MIC*    CIPROFLOXACIN <=0.5 SENSITIVE Sensitive     ERYTHROMYCIN <=0.25 SENSITIVE Sensitive     GENTAMICIN <=0.5 SENSITIVE Sensitive     OXACILLIN <=0.25 SENSITIVE Sensitive     TETRACYCLINE <=1 SENSITIVE Sensitive     VANCOMYCIN <=0.5 SENSITIVE Sensitive     TRIMETH/SULFA <=10 SENSITIVE Sensitive     CLINDAMYCIN <=0.25 SENSITIVE Sensitive     RIFAMPIN <=0.5 SENSITIVE Sensitive     Inducible Clindamycin NEGATIVE Sensitive     * STAPHYLOCOCCUS AUREUS  Urine culture     Status: Abnormal   Collection Time: 06/21/18  2:13 PM  Result Value Ref Range Status   Specimen Description   Final    URINE, RANDOM Performed at [facility] [address] [place], [place] [address]    Special Requests   Final    NONE Performed at [facility], [place] [address] [place], [place] [address]    Culture (A)  Final    10,000 COLONIES/mL DIPHTHEROIDS(CORYNEBACTERIUM SPECIES) Standardized susceptibility testing for this organism is not available. Performed at [facility], [place] [address], [place], [place] [address]    Report Status 06/23/2018 FINAL  Final   Blood Culture ID Panel (Reflexed)     Status: Abnormal   Collection Time: 06/21/18  2:13 PM  Result Value Ref Range Status   Enterococcus species NOT DETECTED NOT DETECTED Final   Listeria monocytogenes NOT DETECTED NOT DETECTED Final   Staphylococcus species DETECTED (A) NOT DETECTED Final  Comment: CRITICAL RESULT CALLED TO, READ BACK BY AND VERIFIED WITH: PHRMD J GADHIA _0  06/22/2018 BY S GEZAHEGN    Staphylococcus aureus (BCID) DETECTED (A) NOT DETECTED Final    Comment: Methicillin (oxacillin) susceptible Staphylococcus aureus (MSSA). Preferred therapy is anti staphylococcal beta lactam antibiotic (Cefazolin or Nafcillin), unless clinically contraindicated. CRITICAL RESULT CALLED TO, READ BACK BY AND VERIFIED WITH: PHRMD J GADHIA _1  06/22/2018 BY S GEZAHEGN    Methicillin resistance NOT DETECTED NOT DETECTED Final   Streptococcus species NOT DETECTED NOT DETECTED Final   Streptococcus agalactiae NOT DETECTED NOT DETECTED Final   Streptococcus pneumoniae NOT DETECTED NOT DETECTED Final   Streptococcus pyogenes NOT DETECTED NOT DETECTED Final   Acinetobacter baumannii NOT DETECTED NOT DETECTED Final   Enterobacteriaceae species NOT DETECTED NOT DETECTED Final   Enterobacter cloacae complex NOT DETECTED NOT DETECTED Final   Escherichia coli NOT DETECTED NOT DETECTED Final   Klebsiella oxytoca NOT DETECTED NOT DETECTED Final   Klebsiella pneumoniae NOT DETECTED NOT DETECTED Final   Proteus species NOT DETECTED NOT DETECTED Final   Serratia marcescens NOT DETECTED NOT DETECTED Final   Haemophilus influenzae NOT DETECTED NOT DETECTED Final   Neisseria meningitidis NOT DETECTED NOT DETECTED Final   Pseudomonas aeruginosa NOT DETECTED NOT DETECTED Final   Candida albicans NOT DETECTED NOT DETECTED Final   Candida glabrata NOT DETECTED NOT DETECTED Final   Candida krusei NOT DETECTED NOT DETECTED Final   Candida parapsilosis NOT DETECTED NOT DETECTED Final   Candida tropicalis  NOT DETECTED NOT DETECTED Final    Comment: Performed at [facility], [place] [address] [place], [place] [address]  Culture, blood (routine x 2)     Status: None   Collection Time: 06/21/18  3:22 PM  Result Value Ref Range Status   Specimen Description   Final    BLOOD RIGHT FOREARM Performed at [facility] [address] [place], [place] [address]    Special Requests   Final    BOTTLES DRAWN AEROBIC AND ANAEROBIC Blood Culture results may not be optimal due to an excessive volume of blood received in culture bottles Performed at [facility] [address] [place], [place] [address]    Culture   Final    NO GROWTH 5 DAYS Performed at [facility], [place] [address] [place], [place] [address]    Report Status 06/26/2018 FINAL  Final  MRSA PCR Screening     Status: None   Collection Time: 06/21/18  8:48 PM  Result Value Ref Range Status   MRSA by PCR NEGATIVE NEGATIVE Final    Comment:        The GeneXpert MRSA Assay (FDA approved for NASAL specimens only), is one component of a comprehensive MRSA colonization surveillance program. It is not intended to diagnose MRSA infection nor to guide or monitor treatment for MRSA infections. Performed at [facility], [place] [address], [place], [place] [address]   Culture, blood (Routine X 2) w Reflex to ID Panel     Status: None (Preliminary result)   Collection Time: 06/23/18  1:00 PM  Result Value Ref Range Status   Specimen Description   Final    BLOOD RIGHT ANTECUBITAL Performed at [facility] [address] [place], [place] [address]    Special Requests   Final    BOTTLES DRAWN AEROBIC AND ANAEROBIC Blood Culture adequate volume Performed at [facility] [address] [place],  [place] [address]    Culture   Final    NO GROWTH 4 DAYS Performed at [facility], [place] [address] [place], [place] [address]     Report Status PENDING  Incomplete  Culture, blood (routine x 2)     Status: None (Preliminary result)   Collection Time: 06/23/18  1:00 PM  Result Value Ref Range Status   Specimen Description   Final    BLOOD RIGHT HAND Performed at [facility], [place] [address] [place], [place] [address]    Special Requests   Final    BOTTLES DRAWN AEROBIC AND ANAEROBIC Blood Culture adequate volume Performed at [facility] [address], [place], [place] [address]    Culture   Final    NO GROWTH 4 DAYS Performed at [facility], [place] [address] [place], [place] [address]    Report Status PENDING  Incomplete    Studies/Results: No results found.   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  Will change ancef to daptomycin out of concern he has cephalosporin rash.  He needs 9 more days of therapy. Explained to pt and wife.   Diagnosis: Staph bacteremia, neutropenic fever  Culture Result: Staph aureus  No Known Allergies  OPAT Orders Discharge antibiotics: Per pharmacy protocol daptomycin  Duration: 14 days End Date: 07-06-18  [facility] Care Per Protocol:  Labs weekly while on IV antibiotics: x__ CBC with differential __ BMP _x_ CMP __ CRP __ ESR __ Vancomycin trough _x_ CK   Fax weekly labs to [phone]  Clinic Follow Up Appt: pcp Repeat BCx 1 week after finishing his anbx           Bobby Rumpf MD, FACP Infectious Diseases (pager) [phone] [email or web address] 06/27/2018, 4:00 PM  LOS: 6 days

## 2018-06-27 NOTE — Progress Notes (Signed)
Pharmacy Antibiotic Note  [name] Rose is a 68 y.o. male with a h/o stage IV bladder cancer admitted on 06/21/2018 with generalized weakness.  Bcx + MSSA.  Pharmacy had been dosing Cefazolin per ID, but per Dr. Antonieta Pert request, increase coverage to Cefepime d/t neutropenia on 3/13. WBC improving and on 3/17 ID has changed Cefepime back to Cefazolin, with pharmacy dosing. Today due to concern of presence of rash, Cefazolin being d/c'ed and pharmacy consulted to dose Daptomycin.  06/27/2018: - Renal function remains stable, CrCl~ 70 ml/min - Afebrile - WBC now 15.1, ANC 10.9.  Granix 3/12 >> 3/16  Plan:  D/C Cefazolin  Daptomycin $RemoveBef'6mg'pkWvpfstSJ$ /kg IV q24h   Follow up renal fxn, culture results, and clinical course.   Height: $Remove'6\' 2"'NwpfOIg$  (188 cm) Weight: 265 lb (120.2 kg) IBW/kg (Calculated) : 82.2  Temp (24hrs), Avg:98.2 F (36.8 C), Min:97.8 F (36.6 C), Max:98.6 F (37 C)  Recent Labs  Lab 06/21/18 1435  06/21/18 1717  06/22/18 1716  06/23/18 0647 06/24/18 0423 06/24/18 1735 06/25/18 0606 06/26/18 0313 06/26/18 2033 06/27/18 0628  WBC  --    < >  --    < >  --    < >  --  0.4*  --  1.0* 4.4 12.2* 15.1*  CREATININE  --    < >  --    < >  --    < >  --  1.67* 1.61* 1.54* 1.43*  --  1.43*  LATICACIDVEN 3.1*  --  2.5*  --  2.7*  --  1.8  --   --   --   --   --   --    < > = values in this interval not displayed.    Estimated Creatinine Clearance: 70 mL/min (A) (by C-G formula based on SCr of 1.43 mg/dL (H)).    No Known Allergies  Antimicrobials this admission: 3/11 cefepime >> 3/12 3/11 vanc >> 3/12 3/12 Ancef>> 3/13 3/13 Cefepime >> 3/16 3/16 Cefazolin >> 3/17 3/17 Daptomycin >>   Dose adjustments this admission:   Microbiology results: 3/11 BCx: Staph aureus      3/12 BCID:  MSSA  3/11 UCx:  Diphtheroids (Corynebacterium species) 3/11 MRSA PCR: neg 3/13 BCx: NGTD   Thank you for allowing pharmacy to be a part of this patient's care.  Leone Haven,  PharmD 06/27/2018 4:30 PM

## 2018-06-27 NOTE — Progress Notes (Addendum)
PROGRESS NOTE    [name] Rose  LKT:[id number] DOB: December 27, 1950 DOA: 06/21/2018 PCP: Shirline Frees, MD  Brief Narrative:68 y.o.malewith medical history significant ofstage IV bladder cancer going through chemotherapy his last chemotherapy was 224 2028 days prior to admission to the hospital. Patient reports usually the second week after the chemotherapy he feels very weak but this is the first week after chemotherapy he has no appetite very decreased p.o. intake with lightheadedness and dizziness upon standing. He is also not able to eat due to ulcers in his mouth. Low-grade temp at home. He does complain of nausea but no vomiting diarrhea or constipation. Denies any cough shortness of breath. He reported hematuria at home has not urinated since coming to the ER.    ED Course:Patient received cefepime sodium 136 potassium 4.1 BUN 58 creatinine 1.69 anion gap is 13 magnesium 1.5 lipase 27 total bilirubin 1.8 lactic acid level was 3.1 white count was 0.2 hemoglobin 10.7 platelet count 10. EKG in the ER rapid A. fib   Assessment & Plan:   Active Problems:   Neutropenic fever (HCC)   New onset a-fib (HCC)   Mucositis due to chemotherapy   AKI (acute kidney injury) ([place])   Pancytopenia (HCC)   MSSA bacteremia   Essential hypertension   Hyperglycemia   Mild hyperlipidemia   Bacteremia   #1 neutropenic fever/sepsis inin the setting ofstage IV bladder cancer status post recent chemo.Patient presented with hypotension tachycardia and fever and severe neutropenia and thrombocytopenia. Blood culture with MSSA bacteremia.  Patient is currently on Ancef.Transthoracic echo ejection fraction 55 to 60%. Moderate thickening of the aortic valve probably mild calcification of the valve no vegetations detected.    TEE no vegetation   Out of bed as tolerated.  Ambulate every shift.  Transfer to telemetry.  #2 pancytopenia secondary to recent chemo hemoglobin 10.2  today status post  2 units of blood transfusion yesterday.  Platelet count 11,000.  Transfuse if bleeding or less than 10,000.  #3 mucositis improving continue Magic mouthwash.    #4  Hypertension  on Cardizem beta-blocker.     #5 new onset paroxysmal atrial fibrillationrate controlled on Cardizem beta-blocker.  He is not on anticoagulation  to severe thrombocytopenia and ongoing anemia requiring blood transfusion and platelet transfusion.    #6 AKI back to baseline with IV fluids   #7  Persistent hypomagnesemia hypokalemia replete.  I have placed him on oral potassium and magnesium along with IV replacement.   #8 generalized rash possibly from antibiotics.  Will start Benadryl as needed. DVT prophylaxis:SCD Code Status:Full code Family Communication:Discussed with wife in the room Disposition Plan:Pending clinical improvement  Estimated body mass index is 34.02 kg/m as calculated from the following:   Height as of this encounter: $RemoveBeforeD'6\' 2"'[id number]$  (1.88 m).   Weight as of this encounter: 120.2 kg.    Subjective:  He is awake alert appetite is better mouth is getting better wanting to order order breakfast this morning had some diarrhea yesterday probably after getting stool softeners no nausea vomiting reported staff reported itching rash all over the body question secondary to Ancef Objective: Vitals:   06/27/18 0200 06/27/18 0300 06/27/18 0347 06/27/18 0745  BP: (!) 145/75 125/78    Pulse: 97 78    Resp: (!) 21 18    Temp:   97.8 F (36.6 C) 98.4 F (36.9 C)  TempSrc:   Oral Oral  SpO2: (!) 88% 93%    Weight:      Height:  Intake/Output Summary (Last 24 hours) at 06/27/2018 0831 Last data filed at 06/27/2018 0509 Gross per 24 hour  Intake 1889.51 ml  Output 5115 ml  Net -3225.49 ml   Filed Weights   06/21/18 1418  Weight: 120.2 kg    Examination:  General exam: Appears calm and comfortable  Respiratory system: Clear to auscultation. Respiratory effort normal.  Cardiovascular system: S1 & S2 heard, RRR. No JVD, murmurs, rubs, gallops or clicks. No pedal edema. Gastrointestinal system: Abdomen is nondistended, soft and nontender. No organomegaly or masses felt. Normal bowel sounds heard. Central nervous system: Alert and oriented. No focal neurological deficits. Extremities: Symmetric 5 x 5 power. Skin: Generalized rash  Psychiatry: Judgement and insight appear normal. Mood & affect appropriate.     Data Reviewed: I have personally reviewed following labs and imaging studies  CBC: Recent Labs  Lab 06/21/18 1438  06/24/18 0423 06/25/18 0606 06/26/18 0313 06/26/18 2033 06/27/18 0628  WBC 0.2*   < > 0.4* 1.0* 4.4 12.2* 15.1*  NEUTROABS 0.0*  --  TOO FEW TO COUNT, SMEAR AVAILABLE FOR REVIEW TOO FEW TO COUNT, SMEAR AVAILABLE FOR REVIEW 2.9  --  10.9*  HGB 10.7*   < > 9.3* 7.8* 8.2* 10.5* 10.9*  HCT 32.1*   < > 28.0* 24.3* 25.0* 31.9* 33.7*  MCV 94.4   < > 93.0 95.7 94.0 93.0 92.8  PLT 10*   < > 6* <5* 12* 11* 11*   < > = values in this interval not displayed.   Basic Metabolic Panel: Recent Labs  Lab 06/22/18 0226  06/24/18 0423 06/24/18 0612 06/24/18 1735 06/25/18 0606 06/26/18 0313 06/27/18 0628  NA 138   < > 136  --  135 138 137 136  K 3.4*   < > 2.6*  --  2.9* 3.0* 3.1* 3.1*  CL 104   < > 100  --  102 103 103 98  CO2 23   < > 23  --  $R'25 24 24 29  'Gt$ GLUCOSE 158*   < > 120*  --  112* 103* 104* 105*  BUN 48*   < > 43*  --  46* 44* 46* 37*  CREATININE 1.56*   < > 1.67*  --  1.61* 1.54* 1.43* 1.43*  CALCIUM 8.1*   < > 8.2*  --  7.8* 7.9* 7.8* 8.0*  MG 1.5*  --  1.2*  --   --  1.6* 1.6* 1.3*  PHOS  --   --   --  1.9*  --   --   --   --    < > = values in this interval not displayed.   GFR: Estimated Creatinine Clearance: 70 mL/min (A) (by C-G formula based on SCr of 1.43 mg/dL (H)). Liver Function Tests: Recent Labs  Lab 06/23/18 0232 06/24/18 0423 06/25/18 0606 06/26/18 0313 06/27/18 0628  AST 14* 16 12* 15 18  ALT $Re'18 10  10 15 18  'NZM$ ALKPHOS 35* 31* 30* 39 67  BILITOT 1.2 1.3* 1.0 0.8 0.7  PROT 5.3* 5.0* 4.6* 4.9* 5.2*  ALBUMIN 2.9* 2.5* 2.2* 2.5* 2.5*   Recent Labs  Lab 06/21/18 1438  LIPASE 27   No results for input(s): AMMONIA in the last 168 hours. Coagulation Profile: No results for input(s): INR, PROTIME in the last 168 hours. Cardiac Enzymes: Recent Labs  Lab 06/21/18 1438  TROPONINI <0.03   BNP (last 3 results) No results for input(s): PROBNP in the last 8760 hours. HbA1C: No results for  input(s): HGBA1C in the last 72 hours. CBG: Recent Labs  Lab 06/24/18 0434  GLUCAP 109*   Lipid Profile: No results for input(s): CHOL, HDL, LDLCALC, TRIG, CHOLHDL, LDLDIRECT in the last 72 hours. Thyroid Function Tests: No results for input(s): TSH, T4TOTAL, FREET4, T3FREE, THYROIDAB in the last 72 hours. Anemia Panel: No results for input(s): VITAMINB12, FOLATE, FERRITIN, TIBC, IRON, RETICCTPCT in the last 72 hours. Sepsis Labs: Recent Labs  Lab 06/21/18 1435 06/21/18 1717 06/21/18 1732 06/22/18 0226 06/22/18 1716 06/23/18 0232 06/23/18 0647  PROCALCITON  --   --  1.08 2.81  --  17.47  --   LATICACIDVEN 3.1* 2.5*  --   --  2.7*  --  1.8    Recent Results (from the past 240 hour(s))  Culture, blood (routine x 2)     Status: Abnormal   Collection Time: 06/21/18  2:13 PM  Result Value Ref Range Status   Specimen Description   Final    BLOOD RIGHT ANTECUBITAL Performed at [facility], [place] [address] [place], [place] [address]    Special Requests   Final    BOTTLES DRAWN AEROBIC AND ANAEROBIC Blood Culture results may not be optimal due to an excessive volume of blood received in culture bottles Performed at [place] [address], [place], [place] [address]    Culture  Setup Time   Final    GRAM POSITIVE COCCI CRITICAL RESULT CALLED TO, READ BACK BY AND VERIFIED WITH: PHRMD J GADHIA $Remove'@0846'[id number]$  06/22/2018 BY S GEZAHEGN Performed at [facility], [place] [address] [place], [place] [address]    Culture STAPHYLOCOCCUS AUREUS (A)  Final   Report Status 06/24/2018 FINAL  Final   Organism ID, Bacteria STAPHYLOCOCCUS AUREUS  Final      Susceptibility   Staphylococcus aureus - MIC*    CIPROFLOXACIN <=0.5 SENSITIVE Sensitive     ERYTHROMYCIN <=0.25 SENSITIVE Sensitive     GENTAMICIN <=0.5 SENSITIVE Sensitive     OXACILLIN <=0.25 SENSITIVE Sensitive     TETRACYCLINE <=1 SENSITIVE Sensitive     VANCOMYCIN <=0.5 SENSITIVE Sensitive     TRIMETH/SULFA <=10 SENSITIVE Sensitive     CLINDAMYCIN <=0.25 SENSITIVE Sensitive     RIFAMPIN <=0.5 SENSITIVE Sensitive     Inducible Clindamycin NEGATIVE Sensitive     * STAPHYLOCOCCUS AUREUS  Urine culture     Status: Abnormal   Collection Time: 06/21/18  2:13 PM  Result Value Ref Range Status   Specimen Description   Final    URINE, RANDOM Performed at [place] [address] [place], [place] [address]    Special Requests   Final    NONE Performed at [facility], [place] [address], [place], [place] [address]    Culture (A)  Final    10,000 COLONIES/mL DIPHTHEROIDS(CORYNEBACTERIUM SPECIES) Standardized susceptibility testing for this organism is not available. Performed at [facility], [place] [address], [place], [place] [address]    Report Status 06/23/2018 FINAL  Final  Blood Culture ID Panel (Reflexed)     Status: Abnormal   Collection Time: 06/21/18  2:13 PM  Result Value Ref Range Status   Enterococcus species NOT DETECTED NOT DETECTED Final   Listeria monocytogenes NOT DETECTED NOT DETECTED Final   Staphylococcus species DETECTED (A) NOT DETECTED Final    Comment: CRITICAL RESULT CALLED TO, READ BACK BY AND VERIFIED WITH: PHRMD J GADHIA $Remove'@0846'[email or web address]$  06/22/2018 BY S GEZAHEGN    Staphylococcus  aureus (BCID) DETECTED (A) NOT DETECTED Final    Comment: Methicillin (oxacillin) susceptible Staphylococcus aureus (MSSA). Preferred therapy is anti  staphylococcal beta lactam antibiotic (Cefazolin or Nafcillin), unless clinically contraindicated. CRITICAL RESULT CALLED TO, READ BACK BY AND VERIFIED WITH: PHRMD J GADHIA $Remove'@0846'[id number]$  06/22/2018 BY S GEZAHEGN    Methicillin resistance NOT DETECTED NOT DETECTED Final   Streptococcus species NOT DETECTED NOT DETECTED Final   Streptococcus agalactiae NOT DETECTED NOT DETECTED Final   Streptococcus pneumoniae NOT DETECTED NOT DETECTED Final   Streptococcus pyogenes NOT DETECTED NOT DETECTED Final   Acinetobacter baumannii NOT DETECTED NOT DETECTED Final   Enterobacteriaceae species NOT DETECTED NOT DETECTED Final   Enterobacter cloacae complex NOT DETECTED NOT DETECTED Final   Escherichia coli NOT DETECTED NOT DETECTED Final   Klebsiella oxytoca NOT DETECTED NOT DETECTED Final   Klebsiella pneumoniae NOT DETECTED NOT DETECTED Final   Proteus species NOT DETECTED NOT DETECTED Final   Serratia marcescens NOT DETECTED NOT DETECTED Final   Haemophilus influenzae NOT DETECTED NOT DETECTED Final   Neisseria meningitidis NOT DETECTED NOT DETECTED Final   Pseudomonas aeruginosa NOT DETECTED NOT DETECTED Final   Candida albicans NOT DETECTED NOT DETECTED Final   Candida glabrata NOT DETECTED NOT DETECTED Final   Candida krusei NOT DETECTED NOT DETECTED Final   Candida parapsilosis NOT DETECTED NOT DETECTED Final   Candida tropicalis NOT DETECTED NOT DETECTED Final    Comment: Performed at [facility], [place]. [address] [place], [place] [address]  Culture, blood (routine x 2)     Status: None   Collection Time: 06/21/18  3:22 PM  Result Value Ref Range Status   Specimen Description   Final    BLOOD RIGHT FOREARM Performed at [place] [address] [place], [place] [address]    Special Requests   Final    BOTTLES DRAWN AEROBIC AND ANAEROBIC Blood Culture results may not be optimal due to an excessive volume of blood received in culture bottles Performed at [place] [address] [place], [place] [address]    Culture   Final    NO GROWTH 5 DAYS Performed at [facility], [place] [address] [place], [place] [address]    Report Status 06/26/2018 FINAL  Final  MRSA PCR Screening     Status: None   Collection Time: 06/21/18  8:48 PM  Result Value Ref Range Status   MRSA by PCR NEGATIVE NEGATIVE Final    Comment:        The GeneXpert MRSA Assay (FDA approved for NASAL specimens only), is one component of a comprehensive MRSA colonization surveillance program. It is not intended to diagnose MRSA infection nor to guide or monitor treatment for MRSA infections. Performed at [facility], [place] [address], [place], [place] [address]   Culture, blood (Routine X 2) w Reflex to ID Panel     Status: None (Preliminary result)   Collection Time: 06/23/18  1:00 PM  Result Value Ref Range Status   Specimen Description   Final    BLOOD RIGHT ANTECUBITAL Performed at [place] [address], [place], [place] [address]    Special Requests   Final    BOTTLES DRAWN AEROBIC AND ANAEROBIC Blood Culture adequate volume Performed at [place] [address] [place], [place] [address]    Culture   Final    NO GROWTH 3 DAYS Performed at [facility],  [address], [place], [place] [address]    Report Status PENDING  Incomplete  Culture, blood (routine x 2)     Status: None (Preliminary result)   Collection Time: 06/23/18  1:00 PM  Result Value Ref Range Status   Specimen Description   Final    BLOOD RIGHT HAND Performed at [facility], [place] [address], [place], [place] [address]    Special Requests   Final    BOTTLES DRAWN AEROBIC AND ANAEROBIC Blood Culture adequate volume Performed at [place] [address] [place], [place] [address]    Culture   Final    NO GROWTH 3 DAYS Performed at [facility], [place] [address], [place], [place] [address]    Report Status PENDING  Incomplete         Radiology Studies: No results found.      Scheduled Meds: . sodium chloride   Intravenous Once  . chlorhexidine  15 mL Mouth/Throat QID  . Chlorhexidine Gluconate Cloth  6 each Topical Daily  . diltiazem  240 mg Oral Daily  . feeding supplement (ENSURE ENLIVE)  237 mL Oral TID BM  . folic acid  2 mg Oral Daily  . loratadine  10 mg Oral Daily  . magic mouthwash w/lidocaine  5 mL Oral QID  . magnesium oxide  800 mg Oral BID  . metoprolol tartrate  25 mg Oral BID  . potassium chloride  20 mEq Oral BID  . promethazine  12.5 mg Intravenous Once  . sodium bicarbonate/sodium chloride   Mouth Rinse Q4H  . sodium chloride flush  10-40 mL Intracatheter Q12H   Continuous Infusions: .  ceFAZolin (ANCEF) IV Stopped (06/27/18 0019)  . ferumoxytol    . magnesium sulfate 1 - 4 g bolus IVPB 2 g (06/27/18 0756)  . potassium chloride       LOS: 6 days     Georgette Shell, MD [facility]  If 7PM-7AM, please contact night-coverage [email or web address] Password [facility] 06/27/2018, 8:31 AM

## 2018-06-27 NOTE — Interval H&P Note (Signed)
History and Physical Interval Note:  06/27/2018 12:14 PM  [name] Rose  has presented today for surgery, with the diagnosis of endocarditis.  The various methods of treatment have been discussed with the patient and family. After consideration of risks, benefits and other options for treatment, the patient has consented to  Procedure(s): TRANSESOPHAGEAL ECHOCARDIOGRAM (TEE) (N/A) as a surgical intervention.  The patient's history has been reviewed, patient examined, no change in status, stable for surgery.  I have reviewed the patient's chart and labs.  Questions were answered to the patient's satisfaction.     [place]

## 2018-06-28 ENCOUNTER — Encounter (HOSPITAL_COMMUNITY): Payer: Self-pay | Admitting: Cardiology

## 2018-06-28 DIAGNOSIS — K1231 Oral mucositis (ulcerative) due to antineoplastic therapy: Secondary | ICD-10-CM

## 2018-06-28 DIAGNOSIS — C772 Secondary and unspecified malignant neoplasm of intra-abdominal lymph nodes: Secondary | ICD-10-CM

## 2018-06-28 DIAGNOSIS — K121 Other forms of stomatitis: Secondary | ICD-10-CM

## 2018-06-28 LAB — CBC
HCT: 32.7 % — ABNORMAL LOW (ref 39.0–52.0)
Hemoglobin: 10.6 g/dL — ABNORMAL LOW (ref 13.0–17.0)
MCH: 30.5 pg (ref 26.0–34.0)
MCHC: 32.4 g/dL (ref 30.0–36.0)
MCV: 94.2 fL (ref 80.0–100.0)
Platelets: 12 10*3/uL — CL (ref 150–400)
RBC: 3.47 MIL/uL — ABNORMAL LOW (ref 4.22–5.81)
RDW: 14.7 % (ref 11.5–15.5)
WBC: 17.5 10*3/uL — ABNORMAL HIGH (ref 4.0–10.5)
nRBC: 0 % (ref 0.0–0.2)

## 2018-06-28 LAB — BASIC METABOLIC PANEL
Anion gap: 10 (ref 5–15)
BUN: 36 mg/dL — ABNORMAL HIGH (ref 8–23)
CO2: 29 mmol/L (ref 22–32)
Calcium: 8 mg/dL — ABNORMAL LOW (ref 8.9–10.3)
Chloride: 98 mmol/L (ref 98–111)
Creatinine, Ser: 1.38 mg/dL — ABNORMAL HIGH (ref 0.61–1.24)
GFR calc Af Amer: >60 mL/min (ref >60)
GFR calc non Af Amer: 53 mL/min — ABNORMAL LOW (ref >60)
Glucose, Bld: 123 mg/dL — ABNORMAL HIGH (ref 70–99)
Potassium: 3.2 mmol/L — ABNORMAL LOW (ref 3.5–5.1)
Sodium: 137 mmol/L (ref 135–145)

## 2018-06-28 LAB — CULTURE, BLOOD (ROUTINE X 2)
Culture: NO GROWTH
Culture: NO GROWTH
Special Requests: ADEQUATE
Special Requests: ADEQUATE

## 2018-06-28 LAB — MAGNESIUM: Magnesium: 1.5 mg/dL — ABNORMAL LOW (ref 1.7–2.4)

## 2018-06-28 MED ORDER — POTASSIUM CHLORIDE CRYS ER 20 MEQ PO TBCR
40.0000 meq | EXTENDED_RELEASE_TABLET | ORAL | Status: AC
  Administered 2018-06-28 (×2): 40 meq via ORAL
  Filled 2018-06-28 (×2): qty 2

## 2018-06-28 MED ORDER — METOPROLOL TARTRATE 25 MG PO TABS: 25.0000 mg | ORAL_TABLET | Freq: Two times a day (BID) | ORAL | 1 refills | Status: DC

## 2018-06-28 MED ORDER — MAGNESIUM SULFATE 4 GM/100ML IV SOLN
4.0000 g | Freq: Once | INTRAVENOUS | Status: AC
  Administered 2018-06-28: 4 g via INTRAVENOUS
  Filled 2018-06-28: qty 100

## 2018-06-28 MED ORDER — DAPTOMYCIN IV (FOR PTA / DISCHARGE USE ONLY): 1000.0000 mg | INTRAVENOUS | 0 refills | Status: DC

## 2018-06-28 MED ORDER — SODIUM CHLORIDE 0.9 % IV SOLN
INTRAVENOUS | Status: DC | PRN
  Administered 2018-06-28: 1000 mL via INTRAVENOUS

## 2018-06-28 MED ORDER — SODIUM CHLORIDE 0.9% IV SOLUTION
Freq: Once | INTRAVENOUS | Status: AC
  Administered 2018-06-28: 13:00:00 via INTRAVENOUS

## 2018-06-28 MED ORDER — SODIUM CHLORIDE 0.9 % IV SOLN
1000.0000 mg | Freq: Every day | INTRAVENOUS | Status: DC
  Filled 2018-06-28: qty 20

## 2018-06-28 MED ORDER — HYDROCORTISONE 1 % EX CREA
TOPICAL_CREAM | Freq: Three times a day (TID) | CUTANEOUS | Status: DC
  Administered 2018-06-28 – 2018-06-29 (×3): via TOPICAL
  Filled 2018-06-28 (×2): qty 28

## 2018-06-28 MED ORDER — DILTIAZEM HCL ER COATED BEADS 240 MG PO CP24: 240.0000 mg | ORAL_CAPSULE | Freq: Every day | ORAL | 1 refills | Status: DC

## 2018-06-28 MED ORDER — ORITAVANCIN DIPHOSPHATE 400 MG IV SOLR
1200.0000 mg | Freq: Once | INTRAVENOUS | Status: AC
  Administered 2018-06-28: 1200 mg via INTRAVENOUS
  Filled 2018-06-28: qty 120

## 2018-06-28 NOTE — TOC Transition Note (Signed)
Transition of Care [facility]) - CM/SW Discharge Note   Patient Details  Name: [name] Rose MRN: [id number] Date of Birth: 1950-11-16  Transition of Care [facility]) CM/SW Contact:  Dessa Phi, RN Phone Number: 06/28/2018, 12:25 PM   Clinical Narrative: d/c home w/[facility] aware of d/c. [facility] aware of d/c with home iv abx instruction. No further CM needs.      Final next level of care: [place] Barriers to Discharge: No Barriers Identified   Patient Goals and CMS Choice Patient states their goals for this hospitalization and ongoing recovery are:: (go home) CMS Medicare.gov Compare Post Acute Care list provided to:: Patient Choice offered to / list presented to : Patient  Discharge Placement                       Discharge Plan and Services Discharge Planning Services: CM Consult Post Acute Care Choice: Home Health([facility]-iv abx;HHRN instruction)              HH Arranged: RN, IV Antibiotics HH Agency: [place] ([facility]   Social Determinants of Health (SDOH) Interventions     Readmission Risk Interventions Readmission Risk Prevention Plan 06/28/2018  Transportation Screening Complete  PCP or Specialist Appt within 3-5 Days Complete  HRI or [place] Complete  Social Work Consult for [place] Planning/Counseling Complete  Palliative Care Screening Not Applicable  Medication Review Press photographer) Complete  Some recent data might be hidden

## 2018-06-28 NOTE — Discharge Summary (Addendum)
Physician Discharge Summary  [name] Rose ZOX:[id number] DOB: 1950/09/21 DOA: 06/21/2018  PCP: Shirline Frees, MD  Admit date: 06/21/2018 Discharge date: 06/29/2018   Addendum 06/29/2018: [name] patient's insurance refused authorization for IV daptomycin.  Case was discussed with infectious disease who opted to give 1 dose of oretovancin and discharge patient home.  Patient was discharged on 06/29/2018.  Admitted From: Home Disposition: Home  Recommendations for Outpatient Follow-up:  1. Follow up with PCP in 1-2 weeks 2. Please obtain BMP/CBC in one week   Home Health: Yes, home health RN Equipment/Devices: none  Discharge Condition: Stable CODE STATUS: Full Diet recommendation: Regular  Brief/Interim Summary:  #) Neutropenic fever due to MSSA bacteremia: Patient was admitted with neutropenic fever and found to have MSSA bacteremia.  Patient was initially started on IV broad-spectrum coverage and then narrowed to IV cefazolin.  It was felt the patient developed a rash to cephalosporins and he was transitioned to IV daptomycin and sent home to complete a total of 2 weeks of IV daptomycin to end on 07/06/2018.  Unfortunately his insurance did not cover this and so he was given 1 dose of oretovancin and discharged home on 06/29/2018.  Patient was seen by infectious disease.  Patient did have a TTE and a TEE that did not show any evidence of vegetation either on the port or the valves.  Patient did clear blood cultures fairly rapidly on repeat blood cultures obtained.  These of been negative since 06/23/2018.  #) New onset atrial fibrillation/hypertension: Patient developed runs of atrial fibrillation with RVR.  This spontaneously converted.  Patient was started on oral diltiazem and metoprolol.  His home bisoprolol/HCTZ was discontinued.  She may continue with home losartan.  Of note due to profound thrombocytopenia and anemia requiring transfusions patient was not placed on oral  anticoagulation.  This should be monitored regularly and he should be started on anticoagulation for stroke prophylaxis if it once his platelets are over 100.  #) Stage IV bladder cancer complicated by thrombocytopenia/neutropeni/anemia a: Patient was noted to have thrombocytopenia and anemia.  Patient did require transfusions of blood and platelets.  This was attributed to his recent chemotherapy in February 2020.  Patient was told to discontinue Naprosyn.  Patient was also noted to have significant mucositis attributed to chemotherapy.  This was treated with folic acid.  Discharge Diagnoses:  Active Problems:   Neutropenic fever (HCC)   New onset a-fib (HCC)   Mucositis due to chemotherapy   AKI (acute kidney injury) ([place])   Pancytopenia (HCC)   Staphylococcus aureus bacteremia   Essential hypertension   Hyperglycemia   Mild hyperlipidemia   Bacteremia   Drug rash    Discharge Instructions  Discharge Instructions    Ambulatory referral to Cardiology   Complete by:  As directed    Call MD for:  difficulty breathing, headache or visual disturbances   Complete by:  As directed    Call MD for:  hives   Complete by:  As directed    Call MD for:  persistant dizziness or light-headedness   Complete by:  As directed    Call MD for:  persistant nausea and vomiting   Complete by:  As directed    Call MD for:  redness, tenderness, or signs of infection (pain, swelling, redness, odor or green/yellow discharge around incision site)   Complete by:  As directed    Call MD for:  severe uncontrolled pain   Complete by:  As directed  Call MD for:  temperature >100.4   Complete by:  As directed    Diet - low sodium heart healthy   Complete by:  As directed    Discharge instructions   Complete by:  As directed    Please follow-up with primary care doctor in 1 to 2 weeks.  Please continue her IV antibiotics until 07/06/2018.   Home infusion instructions [facility] May follow [facility]  Pharmacy Dosing Protocol; May administer Cathflo as needed to maintain patency of vascular access device.; Flushing of vascular access device: per [facility] Protocol: 0.9% NaCl pre/post medica...   Complete by:  As directed    Instructions:  May follow [facility] Pharmacy Dosing Protocol   Instructions:  May administer Cathflo as needed to maintain patency of vascular access device.   Instructions:  Flushing of vascular access device: per [facility] Protocol: 0.9% NaCl pre/post medication administration and prn patency; Heparin 100 u/ml, 31ml for implanted ports and Heparin 10u/ml, 74ml for all other central venous catheters.   Instructions:  May follow [facility] Anaphylaxis Protocol for First Dose Administration in the home: 0.9% NaCl at 25-50 ml/hr to maintain IV access for protocol meds. Epinephrine 0.3 ml IV/IM PRN and Benadryl 25-50 IV/IM PRN s/s of anaphylaxis.   Instructions:  [facility] Infusion Coordinator (RN) to assist per patient IV care needs in the home PRN.   Increase activity slowly   Complete by:  As directed      Allergies as of 06/29/2018   No Known Allergies     Medication List    STOP taking these medications   Aleve 220 MG Caps Generic drug:  Naproxen Sodium   amLODipine 10 MG tablet Commonly known as:  NORVASC   amoxicillin 500 MG capsule Commonly known as:  AMOXIL   bisoprolol-hydrochlorothiazide 10-6.25 MG tablet Commonly known as:  ZIAC   polyethylene glycol packet Commonly known as:  MIRALAX / GLYCOLAX     TAKE these medications   Cinnamon 500 MG Tabs Take 1 tablet by mouth 2 (two) times daily.   dexamethasone 4 MG tablet Commonly known as:  DECADRON Take 2 tablets by mouth once a day on the day after chemotherapy and then take 2 tablets two times a day for 2 days. Take with food.   diltiazem 240 MG 24 hr capsule Commonly known as:  CARDIZEM CD Take 1 capsule (240 mg total) by mouth daily.   gabapentin 100 MG capsule Commonly known as:  NEURONTIN TAKE 3 CAPSULES  (300 MG TOTAL) BY MOUTH 2 (TWO) TIMES DAILY.   lidocaine-prilocaine cream Commonly known as:  EMLA Apply to affected area once   LORazepam 0.5 MG tablet Commonly known as:  ATIVAN TAKE 1 TABLET (0.5 MG TOTAL) BY MOUTH EVERY 6 (SIX) HOURS AS NEEDED (NAUSEA OR VOMITING).   losartan 100 MG tablet Commonly known as:  COZAAR Take 100 mg by mouth daily.   MAGNESIUM PO Take 400 mg by mouth at bedtime. Leg cramps   metoprolol tartrate 25 MG tablet Commonly known as:  LOPRESSOR Take 1 tablet (25 mg total) by mouth 2 (two) times daily.   ondansetron 8 MG tablet Commonly known as:  Zofran Take 1 tablet (8 mg total) by mouth 2 (two) times daily as needed. Start on the third day after chemotherapy.   prochlorperazine 10 MG tablet Commonly known as:  COMPAZINE TAKE 1 TABLET (10 MG TOTAL) BY MOUTH EVERY 6 (SIX) HOURS AS NEEDED (NAUSEA OR VOMITING).   senna 8.6 MG Tabs  tablet Commonly known as:  SENOKOT Take 1 tablet (8.6 mg total) by mouth 2 (two) times daily.   vitamin B-6 250 MG tablet Take 1 tablet (250 mg total) by mouth daily.            Home Infusion Instuctions  (From admission, onward)         Start     Ordered   06/28/18 0000  Home infusion instructions [facility] May follow [place] Dosing Protocol; May administer Cathflo as needed to maintain patency of vascular access device.; Flushing of vascular access device: per [facility] Protocol: 0.9% NaCl pre/post medica...    Question Answer Comment  Instructions May follow [facility] Dosing Protocol   Instructions May administer Cathflo as needed to maintain patency of vascular access device.   Instructions Flushing of vascular access device: per [facility] Protocol: 0.9% NaCl pre/post medication administration and prn patency; Heparin 100 u/ml, 81ml for implanted ports and Heparin 10u/ml, 67ml for all other central venous catheters.   Instructions May follow [facility] Anaphylaxis Protocol for First Dose Administration in the home:  0.9% NaCl at 25-50 ml/hr to maintain IV access for protocol meds. Epinephrine 0.3 ml IV/IM PRN and Benadryl 25-50 IV/IM PRN s/s of anaphylaxis.   Instructions [facility] Infusion Coordinator (RN) to assist per patient IV care needs in the home PRN.      06/28/18 1034         Follow-up Information    [facility] Follow up.   Why:  [facility] for iv infusion,supplies       [facility] Follow up.   Why:  [facility]-iv instruction.[phone]         No Known Allergies  Consultations:  Infectious disease  Cardiology   Procedures/Studies: Dg Chest Port 1 View  Result Date: 06/23/2018 CLINICAL DATA:  Cough, shortness of breath. EXAM: PORTABLE CHEST 1 VIEW COMPARISON:  Radiograph of June 21, 2018. FINDINGS: Stable cardiomediastinal silhouette. Right internal jugular Port-A-Cath is unchanged in position. No pneumothorax or pleural effusion is noted. No acute pulmonary disease is noted. Bony thorax is unremarkable. IMPRESSION: No active disease. Electronically Signed   By: Marijo Conception, M.D.   On: 06/23/2018 09:12   Dg Chest Port 1 View  Result Date: 06/21/2018 CLINICAL DATA:  Fever and weakness EXAM: PORTABLE CHEST 1 VIEW COMPARISON:  None. FINDINGS: The heart size and mediastinal contours are within normal limits. Both lungs are clear. The visualized skeletal structures are unremarkable. There is a right chest wall power-injectable Port-A-Cath with tip in the lower SVC via a right internal jugular vein approach. IMPRESSION: No active disease. Electronically Signed   By: Ulyses Jarred M.D.   On: 06/21/2018 16:00   [place] Chest Soft Tissue  Result Date: 06/22/2018 CLINICAL DATA:  Palpable nodule near the right chest wall Port-A-Cath. This lies near the medial right clavicle. EXAM: ULTRASOUND OF CHEST SOFT TISSUES TECHNIQUE: Ultrasound examination of the chest wall soft tissues was performed in the area of clinical concern. COMPARISON:  None. FINDINGS: Start graphic  assessment of the soft tissues adjacent to the indwelling anterior superior right chest wall Port-A-Cath was performed. Shadowing is seen from the Port-A-Cath. No soft tissue mass or fluid collection. No abnormal echogenicity. IMPRESSION: No soft tissue mass or fluid collection. Electronically Signed   By: Lajean Manes M.D.   On: 06/22/2018 16:59   Echo 06/22/2018: 1. The left ventricle has normal systolic function, with an ejection fraction of 55-60%. The cavity  size was normal. Left ventricular diastolic Doppler parameters are consistent with impaired relaxation.  2. The right ventricle has normal systolic function. The cavity was mildly enlarged. There is no increase in right ventricular wall thickness.  3. Right atrial size was mildly dilated.  4. Moderate thickening of the aortic valve Mild calcification of the aortic valve.  5. There is dilatation of the aortic root measuring 39 mm.  6. No vegetations detected.  TEE 06/27/2018:  1. The left ventricle has normal systolic function, with an ejection fraction of 55-60%.  2. Moderate mitral regurgitation.  3. No valvular vegetations seen.  4. 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    Subjective:   Discharge Exam: Vitals:   06/28/18 2115 06/29/18 0445  BP: (!) 143/82 134/76  Pulse: 86 81  Resp: 18 18  Temp: 98.9 F (37.2 C) 98.7 F (37.1 C)  SpO2: 96% 95%   Vitals:   06/28/18 1307 06/28/18 1402 06/28/18 2115 06/29/18 0445  BP: 130/88 125/65 (!) 143/82 134/76  Pulse: (!) 101 97 86 81  Resp: $Remo'18 18 18 18  'BDzkH$ Temp: 98.1 F (36.7 C) 97.9 F (36.6 C) 98.9 F (37.2 C) 98.7 F (37.1 C)  TempSrc: Oral Oral Oral Oral  SpO2: 99% 95% 96% 95%  Weight:      Height:        General: Pt is alert, awake, not in acute distress Cardiovascular: RRR, S1/S2 +, no rubs, no gallops Respiratory: CTA bilaterally, no wheezing, no rhonchi Abdominal: Soft, NT, ND, bowel sounds + Extremities: no edema, port site is clean dry and  intact Skin: Small discrete nonpruritic macules noted in extremities, petechiae    The results of significant diagnostics from this hospitalization (including imaging, microbiology, ancillary and laboratory) are listed below for reference.     Microbiology: Recent Results (from the past 240 hour(s))  Culture, blood (routine x 2)     Status: Abnormal   Collection Time: 06/21/18  2:13 PM  Result Value Ref Range Status   Specimen Description   Final    BLOOD RIGHT ANTECUBITAL Performed at [facility] [address], [place], [place] [address]    Special Requests   Final    BOTTLES DRAWN AEROBIC AND ANAEROBIC Blood Culture results may not be optimal due to an excessive volume of blood received in culture bottles Performed at [facility] [address] [place], [place] [address]    Culture  Setup Time   Final    GRAM POSITIVE COCCI CRITICAL RESULT CALLED TO, READ BACK BY AND VERIFIED WITH: PHRMD J GADHIA $Remove'@[id number]'[id number]$  06/22/2018 BY S GEZAHEGN Performed at [facility], [place] [address] [place], [place] [address]    Culture STAPHYLOCOCCUS AUREUS (A)  Final   Report Status 06/24/2018 FINAL  Final   Organism ID, Bacteria STAPHYLOCOCCUS AUREUS  Final      Susceptibility   Staphylococcus aureus - MIC*    CIPROFLOXACIN <=0.5 SENSITIVE Sensitive     ERYTHROMYCIN <=0.25 SENSITIVE Sensitive     GENTAMICIN <=0.5 SENSITIVE Sensitive     OXACILLIN <=0.25 SENSITIVE Sensitive     TETRACYCLINE <=1 SENSITIVE Sensitive     VANCOMYCIN <=0.5 SENSITIVE Sensitive     TRIMETH/SULFA <=10 SENSITIVE Sensitive     CLINDAMYCIN <=0.25 SENSITIVE Sensitive     RIFAMPIN <=0.5 SENSITIVE Sensitive     Inducible Clindamycin NEGATIVE Sensitive     * STAPHYLOCOCCUS AUREUS  Urine culture     Status: Abnormal  Collection Time: 06/21/18  2:13 PM  Result Value Ref Range Status   Specimen Description   Final    URINE, RANDOM Performed at [facility] [address] [place], [place] [address]    Special Requests   Final    NONE Performed at [facility], [place] [address], [place], [place] [address]    Culture (A)  Final    10,000 COLONIES/mL DIPHTHEROIDS(CORYNEBACTERIUM SPECIES) Standardized susceptibility testing for this organism is not available. Performed at [facility], [place] [address], [place], [place] [address]    Report Status 06/23/2018 FINAL  Final  Blood Culture ID Panel (Reflexed)     Status: Abnormal   Collection Time: 06/21/18  2:13 PM  Result Value Ref Range Status   Enterococcus species NOT DETECTED NOT DETECTED Final   Listeria monocytogenes NOT DETECTED NOT DETECTED Final   Staphylococcus species DETECTED (A) NOT DETECTED Final    Comment: CRITICAL RESULT CALLED TO, READ BACK BY AND VERIFIED WITH: PHRMD J GADHIA $Remove'@0846'[id number]$  06/22/2018 BY S GEZAHEGN    Staphylococcus aureus (BCID) DETECTED (A) NOT DETECTED Final    Comment: Methicillin (oxacillin) susceptible Staphylococcus aureus (MSSA). Preferred therapy is anti staphylococcal beta lactam antibiotic (Cefazolin or Nafcillin), unless clinically contraindicated. CRITICAL RESULT CALLED TO, READ BACK BY AND VERIFIED WITH: PHRMD J GADHIA $Remove'@0846'SsERipo$  06/22/2018 BY S GEZAHEGN    Methicillin resistance NOT DETECTED NOT DETECTED Final   Streptococcus species NOT DETECTED NOT DETECTED Final   Streptococcus agalactiae NOT DETECTED NOT DETECTED Final   Streptococcus pneumoniae NOT DETECTED NOT DETECTED Final   Streptococcus pyogenes NOT DETECTED NOT DETECTED Final   Acinetobacter baumannii NOT DETECTED NOT DETECTED Final   Enterobacteriaceae species NOT DETECTED NOT DETECTED Final   Enterobacter cloacae complex NOT DETECTED NOT DETECTED Final   Escherichia coli NOT DETECTED NOT DETECTED Final   Klebsiella oxytoca NOT DETECTED NOT DETECTED Final   Klebsiella pneumoniae NOT DETECTED NOT DETECTED Final   Proteus species NOT DETECTED NOT DETECTED Final   Serratia  marcescens NOT DETECTED NOT DETECTED Final   Haemophilus influenzae NOT DETECTED NOT DETECTED Final   Neisseria meningitidis NOT DETECTED NOT DETECTED Final   Pseudomonas aeruginosa NOT DETECTED NOT DETECTED Final   Candida albicans NOT DETECTED NOT DETECTED Final   Candida glabrata NOT DETECTED NOT DETECTED Final   Candida krusei NOT DETECTED NOT DETECTED Final   Candida parapsilosis NOT DETECTED NOT DETECTED Final   Candida tropicalis NOT DETECTED NOT DETECTED Final    Comment: Performed at [facility], [place]. [address], [place], [place] [address]  Culture, blood (routine x 2)     Status: None   Collection Time: 06/21/18  3:22 PM  Result Value Ref Range Status   Specimen Description   Final    BLOOD RIGHT FOREARM Performed at [facility] [address] [place], [place] [address]    Special Requests   Final    BOTTLES DRAWN AEROBIC AND ANAEROBIC Blood Culture results may not be optimal due to an excessive volume of blood received in culture bottles Performed at [facility] [address] [place],  [address]    Culture   Final    NO GROWTH 5 DAYS Performed at [facility], [place] [address] [place],  [address]    Report Status 06/26/2018 FINAL  Final  MRSA PCR Screening     Status: None   Collection Time: 06/21/18  8:48 PM  Result  Value Ref Range Status   MRSA by PCR NEGATIVE NEGATIVE Final    Comment:        The GeneXpert MRSA Assay (FDA approved for NASAL specimens only), is one component of a comprehensive MRSA colonization surveillance program. It is not intended to diagnose MRSA infection nor to guide or monitor treatment for MRSA infections. Performed at [facility], [place] [address], [place], [place] [address]   Culture, blood (Routine X 2) w Reflex to ID Panel     Status: None   Collection Time: 06/23/18  1:00 PM  Result Value Ref Range Status   Specimen Description   Final     BLOOD RIGHT ANTECUBITAL Performed at [facility] [address], [place], [place] [address]    Special Requests   Final    BOTTLES DRAWN AEROBIC AND ANAEROBIC Blood Culture adequate volume Performed at [facility] [address] [place], [place] [address]    Culture   Final    NO GROWTH 5 DAYS Performed at [facility], [place] [address] [place], [place] [address]    Report Status 06/28/2018 FINAL  Final  Culture, blood (routine x 2)     Status: None   Collection Time: 06/23/18  1:00 PM  Result Value Ref Range Status   Specimen Description   Final    BLOOD RIGHT HAND Performed at [facility], [place] [address], [place], [place] [address]    Special Requests   Final    BOTTLES DRAWN AEROBIC AND ANAEROBIC Blood Culture adequate volume Performed at [facility] [address] [place], [place] [address]    Culture   Final    NO GROWTH 5 DAYS Performed at [facility], [place] [address], [place], [place] [address]    Report Status 06/28/2018 FINAL  Final     Labs: BNP (last 3 results) No results for input(s): BNP in the last 8760 hours. Basic Metabolic Panel: Recent Labs  Lab 06/24/18 0612  06/25/18 0606 06/26/18 0313 06/27/18 0628 06/28/18 0325 06/29/18 0451  NA  --    < > 138 137 136 137 137  K  --    < > 3.0* 3.1* 3.1* 3.2* 3.3*  CL  --    < > 103 103 98 98 97*  CO2  --    < > $R'24 24 29 29 31  'eJ$ GLUCOSE  --    < > 103* 104* 105* 123* 111*  BUN  --    < > 44* 46* 37* 36* 30*  CREATININE  --    < > 1.54* 1.43* 1.43* 1.38* 1.24  CALCIUM  --    < > 7.9* 7.8* 8.0* 8.0* 7.9*  MG  --   --  1.6* 1.6* 1.3* 1.5* 1.5*  PHOS 1.9*  --   --   --   --   --   --    < > = values in this interval not displayed.   Liver Function Tests: Recent Labs  Lab 06/24/18 0423 06/25/18 0606 06/26/18 0313 06/27/18 0628 06/29/18 0451  AST 16 12* $Remo'15 18 20  'HtZfp$ ALT $Rem'10 10 15 18 15  'Lhmf$ ALKPHOS 31* 30* 39 67 78  BILITOT 1.3*  1.0 0.8 0.7 0.7  PROT 5.0* 4.6* 4.9* 5.2* 5.0*  ALBUMIN 2.5* 2.2* 2.5* 2.5* 2.6*   No results for input(s): LIPASE, AMYLASE in the last 168 hours. No results for input(s): AMMONIA in the  last 168 hours. CBC: Recent Labs  Lab 06/24/18 0423 06/25/18 0606 06/26/18 0313 06/26/18 2033 06/27/18 0628 06/28/18 0325 06/29/18 0451  WBC 0.4* 1.0* 4.4 12.2* 15.1* 17.5* 14.1*  NEUTROABS TOO FEW TO COUNT, SMEAR AVAILABLE FOR REVIEW TOO FEW TO COUNT, SMEAR AVAILABLE FOR REVIEW 2.9  --  10.9*  --  9.8*  HGB 9.3* 7.8* 8.2* 10.5* 10.9* 10.6* 9.7*  HCT 28.0* 24.3* 25.0* 31.9* 33.7* 32.7* 30.0*  MCV 93.0 95.7 94.0 93.0 92.8 94.2 94.3  PLT 6* <5* 12* 11* 11* 12* 28*   Cardiac Enzymes: Recent Labs  Lab 06/27/18 1638  CKTOTAL 9*   BNP: Invalid input(s): POCBNP CBG: Recent Labs  Lab 06/24/18 0434  GLUCAP 109*   D-Dimer No results for input(s): DDIMER in the last 72 hours. Hgb A1c No results for input(s): HGBA1C in the last 72 hours. Lipid Profile No results for input(s): CHOL, HDL, LDLCALC, TRIG, CHOLHDL, LDLDIRECT in the last 72 hours. Thyroid function studies No results for input(s): TSH, T4TOTAL, T3FREE, THYROIDAB in the last 72 hours.  Invalid input(s): FREET3 Anemia work up No results for input(s): VITAMINB12, FOLATE, FERRITIN, TIBC, IRON, RETICCTPCT in the last 72 hours. Urinalysis    Component Value Date/Time   COLORURINE YELLOW 06/21/2018 1413   APPEARANCEUR CLEAR 06/21/2018 1413   LABSPEC 1.015 06/21/2018 1413   PHURINE 5.0 06/21/2018 1413   GLUCOSEU 150 (A) 06/21/2018 1413   HGBUR MODERATE (A) 06/21/2018 1413   BILIRUBINUR NEGATIVE 06/21/2018 1413   KETONESUR NEGATIVE 06/21/2018 1413   PROTEINUR NEGATIVE 06/21/2018 1413   UROBILINOGEN 0.2 01/31/2010 0624   NITRITE NEGATIVE 06/21/2018 1413   LEUKOCYTESUR NEGATIVE 06/21/2018 1413   Sepsis Labs Invalid input(s): PROCALCITONIN,  WBC,  LACTICIDVEN Microbiology Recent Results (from the past 240 hour(s))  Culture,  blood (routine x 2)     Status: Abnormal   Collection Time: 06/21/18  2:13 PM  Result Value Ref Range Status   Specimen Description   Final    BLOOD RIGHT ANTECUBITAL Performed at [facility], [place] [address] [place], [place] [address]    Special Requests   Final    BOTTLES DRAWN AEROBIC AND ANAEROBIC Blood Culture results may not be optimal due to an excessive volume of blood received in culture bottles Performed at [facility] [address], [place], [place] [address]    Culture  Setup Time   Final    GRAM POSITIVE COCCI CRITICAL RESULT CALLED TO, READ BACK BY AND VERIFIED WITH: PHRMD J GADHIA $Remove'@[email or web address]$  06/22/2018 BY S GEZAHEGN Performed at [facility], [place] [address], [place], [place] [address]    Culture STAPHYLOCOCCUS AUREUS (A)  Final   Report Status 06/24/2018 FINAL  Final   Organism ID, Bacteria STAPHYLOCOCCUS AUREUS  Final      Susceptibility   Staphylococcus aureus - MIC*    CIPROFLOXACIN <=0.5 SENSITIVE Sensitive     ERYTHROMYCIN <=0.25 SENSITIVE Sensitive     GENTAMICIN <=0.5 SENSITIVE Sensitive     OXACILLIN <=0.25 SENSITIVE Sensitive     TETRACYCLINE <=1 SENSITIVE Sensitive     VANCOMYCIN <=0.5 SENSITIVE Sensitive     TRIMETH/SULFA <=10 SENSITIVE Sensitive     CLINDAMYCIN <=0.25 SENSITIVE Sensitive     RIFAMPIN <=0.5 SENSITIVE Sensitive     Inducible Clindamycin NEGATIVE Sensitive     * STAPHYLOCOCCUS AUREUS  Urine culture     Status: Abnormal   Collection Time: 06/21/18  2:13 PM  Result Value Ref Range Status   Specimen  Description   Final    URINE, RANDOM Performed at [facility], [place] [address] [place], [place] [address]    Special Requests   Final    NONE Performed at [facility], [place] [address] [place], [place] [address]    Culture (A)  Final    10,000 COLONIES/mL DIPHTHEROIDS(CORYNEBACTERIUM SPECIES) Standardized susceptibility testing for this organism is not  available. Performed at [facility], [place] [address], [place], [place] [address]    Report Status 06/23/2018 FINAL  Final  Blood Culture ID Panel (Reflexed)     Status: Abnormal   Collection Time: 06/21/18  2:13 PM  Result Value Ref Range Status   Enterococcus species NOT DETECTED NOT DETECTED Final   Listeria monocytogenes NOT DETECTED NOT DETECTED Final   Staphylococcus species DETECTED (A) NOT DETECTED Final    Comment: CRITICAL RESULT CALLED TO, READ BACK BY AND VERIFIED WITH: PHRMD J GADHIA $Remove'@0846'[id number]$  06/22/2018 BY S GEZAHEGN    Staphylococcus aureus (BCID) DETECTED (A) NOT DETECTED Final    Comment: Methicillin (oxacillin) susceptible Staphylococcus aureus (MSSA). Preferred therapy is anti staphylococcal beta lactam antibiotic (Cefazolin or Nafcillin), unless clinically contraindicated. CRITICAL RESULT CALLED TO, READ BACK BY AND VERIFIED WITH: PHRMD J GADHIA $Remove'[id number]$  06/22/2018 BY S GEZAHEGN    Methicillin resistance NOT DETECTED NOT DETECTED Final   Streptococcus species NOT DETECTED NOT DETECTED Final   Streptococcus agalactiae NOT DETECTED NOT DETECTED Final   Streptococcus pneumoniae NOT DETECTED NOT DETECTED Final   Streptococcus pyogenes NOT DETECTED NOT DETECTED Final   Acinetobacter baumannii NOT DETECTED NOT DETECTED Final   Enterobacteriaceae species NOT DETECTED NOT DETECTED Final   Enterobacter cloacae complex NOT DETECTED NOT DETECTED Final   Escherichia coli NOT DETECTED NOT DETECTED Final   Klebsiella oxytoca NOT DETECTED NOT DETECTED Final   Klebsiella pneumoniae NOT DETECTED NOT DETECTED Final   Proteus species NOT DETECTED NOT DETECTED Final   Serratia marcescens NOT DETECTED NOT DETECTED Final   Haemophilus influenzae NOT DETECTED NOT DETECTED Final   Neisseria meningitidis NOT DETECTED NOT DETECTED Final   Pseudomonas aeruginosa NOT DETECTED NOT DETECTED Final   Candida albicans NOT DETECTED NOT DETECTED Final   Candida glabrata NOT DETECTED NOT DETECTED  Final   Candida krusei NOT DETECTED NOT DETECTED Final   Candida parapsilosis NOT DETECTED NOT DETECTED Final   Candida tropicalis NOT DETECTED NOT DETECTED Final    Comment: Performed at [facility], [place]. [address] [place], [place] [address]  Culture, blood (routine x 2)     Status: None   Collection Time: 06/21/18  3:22 PM  Result Value Ref Range Status   Specimen Description   Final    BLOOD RIGHT FOREARM Performed at [facility] [address], [place], [place] [address]    Special Requests   Final    BOTTLES DRAWN AEROBIC AND ANAEROBIC Blood Culture results may not be optimal due to an excessive volume of blood received in culture bottles Performed at [facility] [address] [place], [place] [address]    Culture   Final    NO GROWTH 5 DAYS Performed at [facility], [place] [address] [place], [place] [address]    Report Status 06/26/2018 FINAL  Final  MRSA PCR Screening     Status: None   Collection Time: 06/21/18  8:48 PM  Result Value Ref Range Status   MRSA by PCR NEGATIVE NEGATIVE Final  Comment:        The GeneXpert MRSA Assay (FDA approved for NASAL specimens only), is one component of a comprehensive MRSA colonization surveillance program. It is not intended to diagnose MRSA infection nor to guide or monitor treatment for MRSA infections. Performed at [facility], [place] [address], [place], [place] [address]   Culture, blood (Routine X 2) w Reflex to ID Panel     Status: None   Collection Time: 06/23/18  1:00 PM  Result Value Ref Range Status   Specimen Description   Final    BLOOD RIGHT ANTECUBITAL Performed at [facility] [address], [place], [place] [address]    Special Requests   Final    BOTTLES DRAWN AEROBIC AND ANAEROBIC Blood Culture adequate volume Performed at [facility] [address] [place], [place] [address]    Culture    Final    NO GROWTH 5 DAYS Performed at [facility], [place] [address] [place], [place] [address]    Report Status 06/28/2018 FINAL  Final  Culture, blood (routine x 2)     Status: None   Collection Time: 06/23/18  1:00 PM  Result Value Ref Range Status   Specimen Description   Final    BLOOD RIGHT HAND Performed at [facility], [place] [address], [place], [place] [address]    Special Requests   Final    BOTTLES DRAWN AEROBIC AND ANAEROBIC Blood Culture adequate volume Performed at [facility] [address] [place], [place] [address]    Culture   Final    NO GROWTH 5 DAYS Performed at [facility], [place] [address], [place], [place] [address]    Report Status 06/28/2018 FINAL  Final     Time coordinating discharge: 70  SIGNED:   Cristy Folks, MD  [facility] 06/29/2018, 8:48 AM   If 7PM-7AM, please contact night-coverage [email or web address] Password [email or web address]

## 2018-06-28 NOTE — Progress Notes (Addendum)
PHARMACY CONSULT NOTE FOR:  OUTPATIENT  PARENTERAL ANTIBIOTIC THERAPY (OPAT)  Indication: Staph bacteremia, neutropenic fever Regimen: daptomycin 1000 mg q24 End date: 07/06/18  IV antibiotic discharge orders are pended. To discharging provider:  please sign these orders via discharge navigator,  Select New Orders & click on the button choice - Manage This Unsigned Work.     Thank you for allowing pharmacy to be a part of this patient's care.  Eudelia Bunch, Pharm.D [phone] 06/28/2018 8:14 AM

## 2018-06-28 NOTE — Progress Notes (Signed)
Pt currently receiving 3 hour IV ABT infusion, will be ending around 2100. Per MD Purohit, pt may stay tonight and be D/C'd tomorrow AM. Pt concerned about wife driving late at night. Will continue to monitor.

## 2018-06-28 NOTE — TOC Initial Note (Signed)
Transition of Care [facility]) - Initial/Assessment Note    Patient Details  Name: [name] Rose MRN: [id number] Date of Birth: [birth year]  Transition of Care [facility]) CM/SW Contact:    Dessa Phi, RN Phone Number: 06/28/2018, 9:57 AM  Clinical Narrative: Kerney Elbe aware for iv infusion & following. [facility] rep Santiago Glad will follow for iv instruction. Has picc. Patient/spouse able,& agree to teaching of iv infusion.                  Expected Discharge Plan: [place] Barriers to Discharge: No Barriers Identified   Patient Goals and CMS Choice Patient states their goals for this hospitalization and ongoing recovery are:: (go home) CMS Medicare.gov Compare Post Acute Care list provided to:: Patient Choice offered to / list presented to : Patient  Expected Discharge Plan and Services Expected Discharge Plan: [place] Discharge Planning Services: CM Consult Post Acute Care Choice: Home Health(AHH-iv abx;HHRN instruction) Living arrangements for the past 2 months: Single Family Home Expected Discharge Date: (unknown)                   HH Arranged: RN, IV Antibiotics HH Agency: [facility] ([facility]  Prior Living Arrangements/Services Living arrangements for the past 2 months: Single Family Home Lives with:: Spouse Patient language and need for interpreter reviewed:: Yes Do you feel safe going back to the place where you live?: Yes      Need for Family Participation in Patient Care: No (Comment) Care giver support system in place?: Yes (comment)   Criminal Activity/Legal Involvement Pertinent to Current Situation/Hospitalization: No - Comment as needed  Activities of Daily Living Home Assistive Devices/Equipment: Eyeglasses ADL Screening (condition at time of admission) Patient's cognitive ability adequate to safely complete daily activities?: Yes Is the patient deaf or have difficulty hearing?: No Does the patient have  difficulty seeing, even when wearing glasses/contacts?: No Does the patient have difficulty concentrating, remembering, or making decisions?: No Patient able to express need for assistance with ADLs?: Yes Does the patient have difficulty dressing or bathing?: Yes Independently performs ADLs?: No Communication: Independent Dressing (OT): Needs assistance Is this a change from baseline?: Change from baseline, expected to last >3 days Grooming: Needs assistance Is this a change from baseline?: Change from baseline, expected to last >3 days Feeding: Independent Bathing: Needs assistance Is this a change from baseline?: Change from baseline, expected to last >3 days Toileting: Needs assistance Is this a change from baseline?: Change from baseline, expected to last >3days In/Out Bed: Needs assistance Is this a change from baseline?: Change from baseline, expected to last >3 days Walks in Home: Needs assistance Is this a change from baseline?: Change from baseline, expected to last >3 days Does the patient have difficulty walking or climbing stairs?: Yes(secondary to weakness) Weakness of Legs: Both Weakness of Arms/Hands: Both  Permission Sought/Granted Permission sought to share information with : Case Manager Permission granted to share information with : Yes, Verbal Permission Granted  Share Information with NAME: Administrator, Civil Service)  Permission granted to share info w AGENCY: ([facility] infusion/AHH-iv abx.)  Permission granted to share info w Relationship: (Spouse)  Permission granted to share info w Contact Information: [phone])  Emotional Assessment Appearance:: Appears stated age, Well-Groomed Attitude/Demeanor/Rapport: Gracious Affect (typically observed): Accepting Orientation: : Oriented to Self, Oriented to Place, Oriented to  Time, Oriented to Situation Alcohol / Substance Use: Never Used    Admission diagnosis:  Thrombocytopenia ([place]) [  D69.6] New onset a-fib (HCC)  [I48.91] Neutropenic fever ([place]) [D70.9, R50.81] Sepsis, due to unspecified organism, unspecified whether acute organ dysfunction present [facility]) [A41.9] Patient Active Problem List   Diagnosis Date Noted  . Drug rash   . Bacteremia   . Essential hypertension 06/23/2018  . Hyperglycemia 06/23/2018  . Mild hyperlipidemia 06/23/2018  . Mucositis due to chemotherapy 06/22/2018  . AKI (acute kidney injury) ([place]) 06/22/2018  . Pancytopenia ([place]) 06/22/2018  . MSSA bacteremia   . Neutropenic fever ([place]) 06/21/2018  . Sepsis ([place])   . Thrombocytopenia ([place])   . New onset a-fib ([place])   . Abscess of abdominal cavity ([place]) 12/19/2017  . IDA (iron deficiency anemia) 09/08/2017  . 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 Anorexia 10/17/2016  . Diarrhea 10/17/2016  . LFT elevation 10/17/2016  . Dehydration 10/17/2016  . Fatigue 10/17/2016  . Hypocalcemia 10/17/2016  . Goals of care, counseling/discussion 09/30/2016  . 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 Cancer of dome of urinary bladder ([place]) 09/20/2016  . Postoperative anemia due to acute blood loss 03/04/2015  . Hypokalemia 03/04/2015  . 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/03/2015   PCP:  Shirline Frees, MD Pharmacy:   [id number] [place], [place] [address] Phone: [phone] Fax: [place] - [place], [place] - [address] [place] [address] Phone: [phone] Fax: [phone]     Social Determinants of Health (SDOH) Interventions    Readmission Risk Interventions 30 Day Unplanned Readmission Risk Score     ED to Hosp-Admission (Current) from 06/21/2018 in [place]  30 Day Unplanned Readmission Risk Score (%)  26 Filed at 06/28/2018 0801     This score is the  patient's risk of an unplanned readmission within 30 days of being discharged (0 -100%). The score is based on dignosis, age, lab data, medications, orders, and past utilization.   Low:  0-14.9   Medium: 15-21.9   High: 22-29.9   Extreme: 30 and above       No flowsheet data found.

## 2018-06-28 NOTE — Progress Notes (Signed)
HEMATOLOGY-ONCOLOGY PROGRESS NOTE  SUBJECTIVE: The patient reports that he is feeling better this morning.  Mucositis is improving and he is eating better.  He remains afebrile.  Denies bleeding such as epistaxis, hemoptysis, hematuria, or hematochezia.    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     11/10/2017 -  Chemotherapy    The patient had DOXOrubicin (ADRIAMYCIN) chemo injection 60 mg, 24 mg/m2 = 60 mg (80 % of original dose 30 mg/m2), Intravenous,  Once, 8 of 12 cycles Dose modification: 24 mg/m2 (80 % of original dose 30 mg/m2, Cycle 1, Reason: Provider Judgment) Administration: 60 mg (11/11/2017), 60 mg (12/06/2017), 60 mg (01/10/2018), 60 mg (02/07/2018), 60 mg (03/14/2018), 60 mg (04/18/2018), 60 mg (05/16/2018), 60 mg (06/13/2018) palonosetron (ALOXI) injection 0.25 mg, 0.25 mg, Intravenous,  Once, 8 of 12 cycles Administration: 0.25 mg (11/11/2017), 0.25 mg (12/06/2017), 0.25 mg (01/10/2018), 0.25 mg (02/07/2018), 0.25 mg (03/14/2018), 0.25 mg (04/18/2018), 0.25 mg (05/16/2018), 0.25 mg (06/13/2018) pegfilgrastim (NEULASTA ONPRO KIT) injection 6 mg, 6 mg, Subcutaneous, Once, 8 of 12 cycles Administration: 6 mg (11/11/2017), 6 mg (12/06/2017), 6 mg (01/10/2018), 6 mg (02/07/2018), 6 mg (03/14/2018), 6 mg (04/18/2018), 6 mg  (05/16/2018), 6 mg (06/13/2018) methotrexate (PF) 75 mg in sodium chloride 0.9 % 50 mL chemo infusion, 75.25 mg, Intravenous,  Once, 8 of 12 cycles Administration: 75 mg (11/10/2017), 75 mg (12/05/2017), 75 mg (01/09/2018), 75 mg (02/06/2018), 75 mg (03/13/2018), 75 mg (04/17/2018), 75 mg (05/15/2018), 75 mg (06/12/2018) CISplatin (PLATINOL) 141 mg in sodium chloride 0.9 % 500 mL chemo infusion, 56 mg/m2 = 141 mg (80 % of original dose 70 mg/m2), Intravenous,  Once, 8 of 12 cycles Dose modification: 56 mg/m2 (80 % of original dose 70 mg/m2, Cycle 1, Reason: Provider Judgment) Administration: 141 mg (11/11/2017), 141 mg (12/06/2017), 141 mg (01/10/2018), 141 mg (02/07/2018), 141 mg (03/14/2018), 141 mg (04/18/2018), 141 mg (05/16/2018), 141 mg (06/13/2018) vinBLAStine (VELBAN) 5 mg in sodium chloride 0.9 % 50 mL chemo infusion, 2 mg/m2 = 5 mg (100 % of original dose 2 mg/m2), Intravenous, Once, 8 of 12 cycles Dose modification: 2 mg/m2 (original dose 2 mg/m2, Cycle 1, Reason: Provider Judgment), 5 mg (original dose 3 mg/m2, Cycle 6, Reason: Provider Judgment), 2.4 mg/m2 (80 % of original dose 3 mg/m2, Cycle 8, Reason: Provider Judgment) Administration: 5 mg (11/11/2017), 5 mg (12/06/2017), 5 mg (01/10/2018), 5 mg (02/07/2018), 5 mg (03/14/2018), 5 mg (04/18/2018), 5 mg (05/16/2018), 4 mg (06/13/2018) fosaprepitant (EMEND) 150 mg, dexamethasone (DECADRON) 12 mg in sodium chloride 0.9 % 145 mL IVPB, , Intravenous,  Once, 8 of 12 cycles Administration:  (11/11/2017),  (12/06/2017),  (01/10/2018),  (02/07/2018),  (03/14/2018),  (04/18/2018),  (05/16/2018),  (06/13/2018)  for chemotherapy treatment.      REVIEW OF SYSTEMS:   Constitutional: Denies fevers, chills or abnormal weight loss  Eyes: Denies blurriness of vision Ears, nose, mouth, throat, and face: Mucositis and sore throat improving. Respiratory: Denies cough, dyspnea or wheezes Cardiovascular: Denies palpitation, chest discomfort Gastrointestinal:  Denies nausea, heartburn or change in  bowel habits Skin: Denies abnormal skin rashes Lymphatics: Denies new lymphadenopathy or easy bruising Neurological:Denies numbness, tingling or new weaknesses Behavioral/Psych: Mood is stable, no new changes  Extremities: No lower extremity edema All other systems were reviewed with the patient and are negative.  I have reviewed the past medical history, past surgical history, social history and family history with the patient and they are unchanged from previous note.   PHYSICAL EXAMINATION:  Vitals:   06/27/18 2012 06/28/18 0430  BP: 112/75 (!) 142/79  Pulse: 85 65  Resp: 16 14  Temp: 98.2 F (36.8 C) 98.9 F (37.2 C)  SpO2: 93% 96%   Filed Weights   06/21/18 1418  Weight: 265 lb (120.2 kg)    GENERAL:alert, no distress and comfortable SKIN: skin color, texture, turgor are normal, no rashes or significant lesions EYES: normal, Conjunctiva are pink and non-injected, sclera clear OROPHARYNX:no exudate, no erythema and lips, buccal mucosa, and tongue normal  NECK: supple, thyroid normal size, non-tender, without nodularity LYMPH:  no palpable lymphadenopathy in the cervical, axillary or inguinal LUNGS: clear to auscultation and percussion with normal breathing effort HEART: regular rate & rhythm and no murmurs and no lower extremity edema ABDOMEN:abdomen soft, non-tender and normal bowel sounds Musculoskeletal:no cyanosis of digits and no clubbing  NEURO: alert & oriented x 3 with fluent speech, no focal motor/sensory deficits  LABORATORY DATA:  I have reviewed the data as listed CMP Latest Ref Rng & Units 06/28/2018 06/27/2018 06/26/2018  Glucose 70 - 99 mg/dL 123(H) 105(H) 104(H)  BUN 8 - 23 mg/dL 36(H) 37(H) 46(H)  Creatinine 0.61 - 1.24 mg/dL 1.38(H) 1.43(H) 1.43(H)  Sodium 135 - 145 mmol/L 137 136 137  Potassium 3.5 - 5.1 mmol/L 3.2(L) 3.1(L) 3.1(L)  Chloride 98 - 111 mmol/L 98 98 103  CO2 22 - 32 mmol/L _0 Calcium 8.9 - 10.3 mg/dL 8.0(L) 8.0(L) 7.8(L)   Total Protein 6.5 - 8.1 g/dL - 5.2(L) 4.9(L)  Total Bilirubin 0.3 - 1.2 mg/dL - 0.7 0.8  Alkaline Phos 38 - 126 U/L - 67 39  AST 15 - 41 U/L - 18 15  ALT 0 - 44 U/L - 18 15    Lab Results  Component Value Date   WBC 17.5 (H) 06/28/2018   HGB 10.6 (L) 06/28/2018   HCT 32.7 (L) 06/28/2018   MCV 94.2 06/28/2018   PLT 12 (LL) 06/28/2018   NEUTROABS 10.9 (H) 06/27/2018    ASSESSMENT AND PLAN: #1 neutropenic fever/sepsis inin the setting ofstage IV bladder cancer status post M-VAC cycle 8 on 06/12/2018.  Received G-CSF with his chemotherapy.Patient presented with hypotension tachycardia and fever and severe neutropenia and thrombocytopenia. Blood culture with MSSA bacteremia.  The patient was on Ancef and now switched to daptomycin per infectious disease.  #2 pancytopenia secondary to recent chemohemoglobin 10.6  today status post 2 units of blood transfusion 06/26/2018.  Status post Feraheme on 06/27/2018. Platelet count 12,000. Transfuse if bleeding or less than 10,000.  No transfusion indicated today.  #3 mucositisimproving continue Magic mouthwash.  #4Hypertension  on Cardizem beta-blocker.   #5 new onset paroxysmal atrial fibrillationrate controlled on Cardizem beta-blocker.  He is not on anticoagulation to severe thrombocytopenia and ongoing anemia requiring blood transfusion and platelet transfusion.    #6 Sallye Ober  to baseline with IV fluids  #7  Persistent hypomagnesemiahypokalemia.  Replete per hospitalist.  #8 generalized rash possibly from antibiotics.    On Benadryl as needed.  Mikey Bussing, DNP, AGPCNP-BC, AOCNP

## 2018-06-28 NOTE — Progress Notes (Signed)
Pharmacy - Antimicrobial Stewardship Brief Note  Adjust daptomycin to $RemoveBefor'1000mg'[id number]$  IV q24h (~$RemoveB'8mg'[id number]$ /kg) Monitor CK and renal function Authorization for home in process  Doreene Eland, PharmD, BCPS.   Work Cell: [phone] 06/28/2018 10:21 AM

## 2018-06-28 NOTE — Care Management Important Message (Signed)
Important Message  Patient Details  Name: [name] Rose MRN: [id number] Date of Birth: [birth year]   Medicare Important Message Given:  Yes    Kerin Salen 06/28/2018, 10:28 [place] Message  Patient Details  Name: [name] Rose MRN: [id number] Date of Birth: 1950/11/15   Medicare Important Message Given:  Yes    Kerin Salen 06/28/2018, 10:28 AM

## 2018-06-28 NOTE — Progress Notes (Signed)
INFECTIOUS DISEASE PROGRESS NOTE  ID: [name] Rose is a 68 y.o. male with  Active Problems:   Neutropenic fever ([place])   New onset a-fib ([place])   Mucositis due to chemotherapy   AKI (acute kidney injury) ([place])   Pancytopenia ([place])   MSSA bacteremia   Essential hypertension   Hyperglycemia   Mild hyperlipidemia   Bacteremia   Drug rash  Subjective: Feels well,  Rash unchanged.  Oral ulcer improved.   Abtx:  Anti-infectives (From admission, onward)   Start     Dose/Rate Route Frequency Ordered Stop   06/28/18 2000  DAPTOmycin (CUBICIN) 1,000 mg in sodium chloride 0.9 % IVPB     1,000 mg 240 mL/hr over 30 Minutes Intravenous Daily 06/28/18 1014     06/28/18 0000  daptomycin (CUBICIN) IVPB     1,000 mg Intravenous Every 24 hours 06/28/18 1034 07/06/18 2359   06/27/18 1700  DAPTOmycin (CUBICIN) 721 mg in sodium chloride 0.9 % IVPB  Status:  Discontinued     6 mg/kg  120.2 kg 228.8 mL/hr over 30 Minutes Intravenous Daily 06/27/18 1629 06/28/18 1014   06/26/18 1600  ceFAZolin (ANCEF) IVPB 2g/100 mL premix  Status:  Discontinued     2 g 200 mL/hr over 30 Minutes Intravenous Every 8 hours 06/26/18 1523 06/27/18 1613   06/23/18 1600  ceFEPIme (MAXIPIME) 2 g in sodium chloride 0.9 % 100 mL IVPB  Status:  Discontinued     2 g 200 mL/hr over 30 Minutes Intravenous Every 8 hours 06/23/18 1012 06/26/18 1513   06/22/18 1800  vancomycin (VANCOCIN) 1,250 mg in sodium chloride 0.9 % 250 mL IVPB  Status:  Discontinued     1,250 mg 166.7 mL/hr over 90 Minutes Intravenous Every 24 hours 06/21/18 1740 06/22/18 1245   06/22/18 1600  ceFAZolin (ANCEF) IVPB 2g/100 mL premix  Status:  Discontinued     2 g 200 mL/hr over 30 Minutes Intravenous Every 8 hours 06/22/18 1432 06/23/18 1003   06/22/18 1400  ceFEPIme (MAXIPIME) 2 g in sodium chloride 0.9 % 100 mL IVPB  Status:  Discontinued     2 g 200 mL/hr over 30 Minutes Intravenous Every 8 hours 06/22/18 1358 06/22/18 1432   06/22/18 0400   ceFEPIme (MAXIPIME) 2 g in sodium chloride 0.9 % 100 mL IVPB  Status:  Discontinued     2 g 200 mL/hr over 30 Minutes Intravenous Every 12 hours 06/21/18 1740 06/22/18 1358   06/21/18 1745  vancomycin (VANCOCIN) 2,000 mg in sodium chloride 0.9 % 500 mL IVPB     2,000 mg 250 mL/hr over 120 Minutes Intravenous  Once 06/21/18 1737 06/21/18 2054   06/21/18 1515  ceFEPIme (MAXIPIME) 2 g in sodium chloride 0.9 % 100 mL IVPB     2 g 200 mL/hr over 30 Minutes Intravenous  Once 06/21/18 1500 06/21/18 1631      Medications:  Scheduled: . chlorhexidine  15 mL Mouth/Throat QID  . Chlorhexidine Gluconate Cloth  6 each Topical Daily  . diltiazem  240 mg Oral Daily  . feeding supplement (ENSURE ENLIVE)  237 mL Oral TID BM  . folic acid  2 mg Oral Daily  . hydrocortisone cream   Topical TID  . loratadine  10 mg Oral Daily  . magic mouthwash w/lidocaine  5 mL Oral QID  . magnesium oxide  800 mg Oral BID  . metoprolol tartrate  25 mg Oral BID  . potassium chloride  20 mEq Oral BID  .  sodium bicarbonate/sodium chloride   Mouth Rinse Q4H  . sodium chloride flush  10-40 mL Intracatheter Q12H    Objective: Vital signs in last 24 hours: Temp:  [97.9 F (36.6 C)-98.9 F (37.2 C)] 97.9 F (36.6 C) (03/18 1402) Pulse Rate:  [65-101] 97 (03/18 1402) Resp:  [14-20] 18 (03/18 1402) BP: (112-142)/(65-88) 125/65 (03/18 1402) SpO2:  [93 %-99 %] 95 % (03/18 1402)   General appearance: alert, cooperative and no distress Resp: clear to auscultation bilaterally Chest wall: no tenderness, port site clean. non-tender Cardio: regular rate and rhythm GI: normal findings: bowel sounds normal and soft, non-tender  Lab Results Recent Labs    06/27/18 0628 06/28/18 0325  WBC 15.1* 17.5*  HGB 10.9* 10.6*  HCT 33.7* 32.7*  NA 136 137  K 3.1* 3.2*  CL 98 98  CO2 29 29  BUN 37* 36*  CREATININE 1.43* 1.38*   Liver Panel Recent Labs    06/26/18 0313 06/27/18 0628  PROT 4.9* 5.2*  ALBUMIN 2.5* 2.5*   AST 15 18  ALT 15 18  ALKPHOS 39 67  BILITOT 0.8 0.7   Sedimentation Rate No results for input(s): ESRSEDRATE in the last 72 hours. C-Reactive Protein No results for input(s): CRP in the last 72 hours.  Microbiology: Recent Results (from the past 240 hour(s))  Culture, blood (routine x 2)     Status: Abnormal   Collection Time: 06/21/18  2:13 PM  Result Value Ref Range Status   Specimen Description   Final    BLOOD RIGHT ANTECUBITAL Performed at [facility] [address], [place], [place] [address]    Special Requests   Final    BOTTLES DRAWN AEROBIC AND ANAEROBIC Blood Culture results may not be optimal due to an excessive volume of blood received in culture bottles Performed at [facility] [address] [place], [place] [address]    Culture  Setup Time   Final    GRAM POSITIVE COCCI CRITICAL RESULT CALLED TO, READ BACK BY AND VERIFIED WITH: PHRMD J GADHIA $Remove'[email or web address]$  06/22/2018 BY S GEZAHEGN Performed at [facility], [place] [address] [place], [place] [address]    Culture STAPHYLOCOCCUS AUREUS (A)  Final   Report Status 06/24/2018 FINAL  Final   Organism ID, Bacteria STAPHYLOCOCCUS AUREUS  Final      Susceptibility   Staphylococcus aureus - MIC*    CIPROFLOXACIN <=0.5 SENSITIVE Sensitive     ERYTHROMYCIN <=0.25 SENSITIVE Sensitive     GENTAMICIN <=0.5 SENSITIVE Sensitive     OXACILLIN <=0.25 SENSITIVE Sensitive     TETRACYCLINE <=1 SENSITIVE Sensitive     VANCOMYCIN <=0.5 SENSITIVE Sensitive     TRIMETH/SULFA <=10 SENSITIVE Sensitive     CLINDAMYCIN <=0.25 SENSITIVE Sensitive     RIFAMPIN <=0.5 SENSITIVE Sensitive     Inducible Clindamycin NEGATIVE Sensitive     * STAPHYLOCOCCUS AUREUS  Urine culture     Status: Abnormal   Collection Time: 06/21/18  2:13 PM  Result Value Ref Range Status   Specimen Description   Final    URINE, RANDOM Performed at [facility] [address], [place], [place] [address]     Special Requests   Final    NONE Performed at [facility], [place] [address], [place],  [address]    Culture (A)  Final    10,000 COLONIES/mL DIPHTHEROIDS(CORYNEBACTERIUM SPECIES) Standardized susceptibility testing for this organism is not available. Performed at [facility],  [address], [place], [place] [address]    Report Status 06/23/2018 FINAL  Final  Blood Culture ID Panel (Reflexed)     Status: Abnormal   Collection Time: 06/21/18  2:13 PM  Result Value Ref Range Status   Enterococcus species NOT DETECTED NOT DETECTED Final   Listeria monocytogenes NOT DETECTED NOT DETECTED Final   Staphylococcus species DETECTED (A) NOT DETECTED Final    Comment: CRITICAL RESULT CALLED TO, READ BACK BY AND VERIFIED WITH: PHRMD J GADHIA $Remove'[email or web address]$  06/22/2018 BY S GEZAHEGN    Staphylococcus aureus (BCID) DETECTED (A) NOT DETECTED Final    Comment: Methicillin (oxacillin) susceptible Staphylococcus aureus (MSSA). Preferred therapy is anti staphylococcal beta lactam antibiotic (Cefazolin or Nafcillin), unless clinically contraindicated. CRITICAL RESULT CALLED TO, READ BACK BY AND VERIFIED WITH: PHRMD J GADHIA $Remove'[email or web address]$  06/22/2018 BY S GEZAHEGN    Methicillin resistance NOT DETECTED NOT DETECTED Final   Streptococcus species NOT DETECTED NOT DETECTED Final   Streptococcus agalactiae NOT DETECTED NOT DETECTED Final   Streptococcus pneumoniae NOT DETECTED NOT DETECTED Final   Streptococcus pyogenes NOT DETECTED NOT DETECTED Final   Acinetobacter baumannii NOT DETECTED NOT DETECTED Final   Enterobacteriaceae species NOT DETECTED NOT DETECTED Final   Enterobacter cloacae complex NOT DETECTED NOT DETECTED Final   Escherichia coli NOT DETECTED NOT DETECTED Final   Klebsiella oxytoca NOT DETECTED NOT DETECTED Final   Klebsiella pneumoniae NOT DETECTED NOT DETECTED Final   Proteus species NOT DETECTED NOT DETECTED Final   Serratia marcescens NOT DETECTED NOT DETECTED Final    Haemophilus influenzae NOT DETECTED NOT DETECTED Final   Neisseria meningitidis NOT DETECTED NOT DETECTED Final   Pseudomonas aeruginosa NOT DETECTED NOT DETECTED Final   Candida albicans NOT DETECTED NOT DETECTED Final   Candida glabrata NOT DETECTED NOT DETECTED Final   Candida krusei NOT DETECTED NOT DETECTED Final   Candida parapsilosis NOT DETECTED NOT DETECTED Final   Candida tropicalis NOT DETECTED NOT DETECTED Final    Comment: Performed at [facility], [place]. [address] [place], [place] [address]  Culture, blood (routine x 2)     Status: None   Collection Time: 06/21/18  3:22 PM  Result Value Ref Range Status   Specimen Description   Final    BLOOD RIGHT FOREARM Performed at [facility] [address], [place], [place] [address]    Special Requests   Final    BOTTLES DRAWN AEROBIC AND ANAEROBIC Blood Culture results may not be optimal due to an excessive volume of blood received in culture bottles Performed at [facility] [address], [place], [place] [address]    Culture   Final    NO GROWTH 5 DAYS Performed at [facility], [place] [address], [place], [place] [address]    Report Status 06/26/2018 FINAL  Final  MRSA PCR Screening     Status: None   Collection Time: 06/21/18  8:48 PM  Result Value Ref Range Status   MRSA by PCR NEGATIVE NEGATIVE Final    Comment:        The GeneXpert MRSA Assay (FDA approved for NASAL specimens only), is one component of a comprehensive MRSA colonization surveillance program. It is not intended to diagnose MRSA infection nor to guide or monitor treatment for MRSA infections. Performed at [facility], [place] [address] [place],  [address]   Culture, blood (Routine X 2) w Reflex to ID Panel     Status: None  Collection Time: 06/23/18  1:00 PM  Result Value Ref Range Status   Specimen Description   Final    BLOOD RIGHT ANTECUBITAL Performed at [facility] [address] [place], [place] [address]    Special Requests   Final    BOTTLES DRAWN AEROBIC AND ANAEROBIC Blood Culture adequate volume Performed at [facility] [address], [place], [place] [address]    Culture   Final    NO GROWTH 5 DAYS Performed at [facility], [place] [address], [place], [place] [address]    Report Status 06/28/2018 FINAL  Final  Culture, blood (routine x 2)     Status: None   Collection Time: 06/23/18  1:00 PM  Result Value Ref Range Status   Specimen Description   Final    BLOOD RIGHT HAND Performed at [facility], [place] [address], [place], [place] [address]    Special Requests   Final    BOTTLES DRAWN AEROBIC AND ANAEROBIC Blood Culture adequate volume Performed at [facility] [address] [place], [place] [address]    Culture   Final    NO GROWTH 5 DAYS Performed at [facility], [place] [address] [place], [place] [address]    Report Status 06/28/2018 FINAL  Final    Studies/Results: No results found.   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  Repeat BCx in 2 weeks at PCP.  Feels well         Bobby Rumpf MD, FACP Infectious Diseases (pager) [phone] [email or web address] 06/28/2018, 3:59 PM  LOS: 7 days

## 2018-06-28 NOTE — Discharge Instructions (Signed)
Atrial Fibrillation Atrial fibrillation is a type of irregular or rapid heartbeat (arrhythmia). In atrial fibrillation, the top part of the heart (atria) quivers in a chaotic pattern. This makes the heart unable to pump blood normally. Having atrial fibrillation can increase your risk for other health problems, such as:  Blood can pool in the atria and form clots. If a clot travels to the brain, it can cause a stroke.  The heart muscle may weaken from the irregular blood flow. This can cause heart failure. Atrial fibrillation may start suddenly and stop on its own, or it may become a long-lasting problem. What are the causes? This condition is caused by some heart-related conditions or procedures, including:  High blood pressure. This is the most common cause.  Heart failure.  Heart valve conditions.  Inflammation of the sac that surrounds the heart (pericarditis).  Heart surgery.  Coronary artery disease.  Certain heart rhythm disorders, such as Wolf-Parkinson-White syndrome. Other causes include:  Pneumonia.  Obstructive sleep apnea.  Lung cancer.  Thyroid problems, especially if the thyroid is overactive (hyperthyroidism).  Excessive alcohol or drug use. Sometimes, the cause of this condition is not known. What increases the risk? This condition is more likely to develop in:  Older people.  People who smoke.  People who have diabetes mellitus.  People who are overweight (obese).  Athletes who exercise vigorously.  People who have a family history. What are the signs or symptoms? Symptoms of this condition include:  A feeling that your heart is beating rapidly or irregularly.  A feeling of discomfort or pain in your chest.  Shortness of breath.  Sudden light-headedness or weakness.  Getting tired easily during exercise. In some cases, there are no symptoms. How is this diagnosed? Your health care provider may be able to detect atrial fibrillation when  taking your pulse. If detected, this condition may be diagnosed with:  Electrocardiogram (ECG).  Ambulatory cardiac monitor. This device records your heartbeats for 24 hours or more.  Transthoracic echocardiogram (TTE) to evaluate how blood flows through your heart.  Transesophageal echocardiogram (TEE) to view more detailed images of your heart.  A stress test.  Imaging tests, such as a CT scan or chest X-ray.  Blood tests. How is this treated? This condition may be treated with:  Medicines to slow down the heart rate or bring the heart's rhythm back to normal.  Medicines to prevent blood clots from forming.  Electrical cardioversion. This delivers a low-energy shock to the heart to reset its rhythm.  Ablation. This procedure destroys the part of the heart tissue that sends abnormal signals.  Left atrial appendage occlusion/excision. This seals off a common place in the atria where blood clots can form (left atrial appendage). The goal of treatment is to prevent blood clots from forming and to keep your heart beating at a normal rate and rhythm. Treatment depends on underlying medical conditions and how you feel when you are experiencing fibrillation. Follow these instructions at home: Medicines  Take over-the counter and prescription medicines only as told by your health care provider.  If your health care provider prescribed a blood-thinning medicine (anticoagulant), take it exactly as told. Taking too much blood-thinning medicine can cause bleeding. Taking too little can enable a blood clot to form and travel to the brain, causing a stroke. Lifestyle      Do not use any products that contain nicotine or tobacco, such as cigarettes and e-cigarettes. If you need help quitting, ask your health   care provider.  Do not drink beverages that contain caffeine, such as coffee, soda, and tea.  Follow diet instructions as told by your health care provider.  Exercise regularly as  told by your health care provider.  Do not drink alcohol. General instructions  If you have obstructive sleep apnea, manage your condition as told by your health care provider.  Maintain a healthy weight. Do not use diet pills unless your health care provider approves. Diet pills may make heart problems worse.  Keep all follow-up visits as told by your health care provider. This is important. Contact a health care provider if you:  Notice a change in the rate, rhythm, or strength of your heartbeat.  Are taking an anticoagulant and you notice increased bruising.  Tire more easily when you exercise or exert yourself.  Have a sudden change in weight. Get help right away if you have:   Chest pain, abdominal pain, sweating, or weakness.  Difficulty breathing.  Blood in your vomit, stool (feces), or urine.  Any symptoms of a stroke. "BE FAST" is an easy way to remember the main warning signs of a stroke: ? B - Balance. Signs are dizziness, sudden trouble walking, or loss of balance. ? E - Eyes. Signs are trouble seeing or a sudden change in vision. ? F - Face. Signs are sudden weakness or numbness of the face, or the face or eyelid drooping on one side. ? A - Arms. Signs are weakness or numbness in an arm. This happens suddenly and usually on one side of the body. ? S - Speech. Signs are sudden trouble speaking, slurred speech, or trouble understanding what people say. ? T - Time. Time to call emergency services. Write down what time symptoms started.  Other signs of a stroke, such as: ? A sudden, severe headache with no known cause. ? Nausea or vomiting. ? Seizure. These symptoms may represent a serious problem that is an emergency. Do not wait to see if the symptoms will go away. Get medical help right away. Call your local emergency services (911 in the [place] Do not drive yourself to the hospital. Summary  Atrial fibrillation is a type of irregular or rapid heartbeat  (arrhythmia).  Symptoms include a feeling that your heart is beating fast or irregularly. In some cases, you may not have symptoms.  The condition is treated with medicines to slow down the heart rate or bring the heart's rhythm back to normal. You may also need blood-thinning medicines to prevent blood clots.  Get help right away if you have symptoms or signs of a stroke. This information is not intended to replace advice given to you by your health care provider. Make sure you discuss any questions you have with your health care provider. Document Released: 03/29/2005 Document Revised: 05/20/2017 Document Reviewed: 05/20/2017 Elsevier Interactive Patient Education  2019 Elsevier Inc.  

## 2018-06-29 LAB — CBC WITH DIFFERENTIAL/PLATELET
Abs Immature Granulocytes: 1.34 10*3/uL — ABNORMAL HIGH (ref 0.00–0.07)
Basophils Absolute: 0 10*3/uL (ref 0.0–0.1)
Basophils Relative: 0 %
Eosinophils Absolute: 0.1 10*3/uL (ref 0.0–0.5)
Eosinophils Relative: 0 %
HCT: 30 % — ABNORMAL LOW (ref 39.0–52.0)
Hemoglobin: 9.7 g/dL — ABNORMAL LOW (ref 13.0–17.0)
Immature Granulocytes: 10 %
Lymphocytes Relative: 11 %
Lymphs Abs: 1.5 10*3/uL (ref 0.7–4.0)
MCH: 30.5 pg (ref 26.0–34.0)
MCHC: 32.3 g/dL (ref 30.0–36.0)
MCV: 94.3 fL (ref 80.0–100.0)
Monocytes Absolute: 1.4 10*3/uL — ABNORMAL HIGH (ref 0.1–1.0)
Monocytes Relative: 10 %
Neutro Abs: 9.8 10*3/uL — ABNORMAL HIGH (ref 1.7–7.7)
Neutrophils Relative %: 69 %
Platelets: 28 10*3/uL — CL (ref 150–400)
RBC: 3.18 MIL/uL — ABNORMAL LOW (ref 4.22–5.81)
RDW: 14.6 % (ref 11.5–15.5)
WBC: 14.1 10*3/uL — ABNORMAL HIGH (ref 4.0–10.5)
nRBC: 0 % (ref 0.0–0.2)

## 2018-06-29 LAB — COMPREHENSIVE METABOLIC PANEL
ALT: 15 U/L (ref 0–44)
AST: 20 U/L (ref 15–41)
Albumin: 2.6 g/dL — ABNORMAL LOW (ref 3.5–5.0)
Alkaline Phosphatase: 78 U/L (ref 38–126)
Anion gap: 9 (ref 5–15)
BUN: 30 mg/dL — ABNORMAL HIGH (ref 8–23)
CO2: 31 mmol/L (ref 22–32)
Calcium: 7.9 mg/dL — ABNORMAL LOW (ref 8.9–10.3)
Chloride: 97 mmol/L — ABNORMAL LOW (ref 98–111)
Creatinine, Ser: 1.24 mg/dL (ref 0.61–1.24)
GFR calc Af Amer: >60 mL/min (ref >60)
GFR calc non Af Amer: >60 mL/min (ref >60)
Glucose, Bld: 111 mg/dL — ABNORMAL HIGH (ref 70–99)
Potassium: 3.3 mmol/L — ABNORMAL LOW (ref 3.5–5.1)
Sodium: 137 mmol/L (ref 135–145)
Total Bilirubin: 0.7 mg/dL (ref 0.3–1.2)
Total Protein: 5 g/dL — ABNORMAL LOW (ref 6.5–8.1)

## 2018-06-29 LAB — BPAM PLATELET PHERESIS
Blood Product Expiration Date: 202003182359
ISSUE DATE / TIME: 202003181248
Unit Type and Rh: 5100

## 2018-06-29 LAB — PREPARE PLATELET PHERESIS: Unit division: 0

## 2018-06-29 LAB — MAGNESIUM: Magnesium: 1.5 mg/dL — ABNORMAL LOW (ref 1.7–2.4)

## 2018-06-29 MED ORDER — HEPARIN SOD (PORK) LOCK FLUSH 100 UNIT/ML IV SOLN
500.0000 [IU] | INTRAVENOUS | Status: AC | PRN
  Administered 2018-06-29: 500 [IU]

## 2018-06-29 MED ORDER — MAGNESIUM SULFATE 4 GM/100ML IV SOLN
4.0000 g | Freq: Once | INTRAVENOUS | Status: AC
  Administered 2018-06-29: 4 g via INTRAVENOUS
  Filled 2018-06-29: qty 100

## 2018-06-29 MED ORDER — POTASSIUM CHLORIDE CRYS ER 20 MEQ PO TBCR
40.0000 meq | EXTENDED_RELEASE_TABLET | ORAL | Status: DC
  Administered 2018-06-29: 40 meq via ORAL
  Filled 2018-06-29: qty 2

## 2018-06-29 NOTE — Progress Notes (Signed)
Pt discharged from the unit via wheelchair. Discharge instructions were reviewed with pt and family members at bedside. Pt belongings and extra medications were sent home with pt. No questions or concerns at this time.  Wanda Cellucci W Elliott Quade, RN

## 2018-06-29 NOTE — TOC Transition Note (Signed)
Transition of Care [facility]) - CM/SW Discharge Note   Patient Details  Name: [name] Rose MRN: [id number] Date of Birth: [birth year]  [facility] of [facility]) CM/SW Contact:  Dessa Phi, RN Phone Number: 06/29/2018, 9:34 AM   Clinical Narrative: Patient for d/c home. Patient has completed iv abx in hospital. No HHC needed.No further CM needs.      Final next level of care: Home/Self Care Barriers to Discharge: No Barriers Identified   Patient Goals and CMS Choice Patient states their goals for this hospitalization and ongoing recovery are:: (go home) CMS Medicare.gov Compare Post Acute Care list provided to:: Patient Choice offered to / list presented to : Patient  Discharge Placement                       Discharge Plan and Services Discharge Planning Services: CM Consult Post Acute Care Choice: Home Health([facility]-iv abx;HHRN instruction)              HH Arranged: RN, IV Antibiotics HH Agency: [place] ([facility]   Social Determinants of Health (SDOH) Interventions     Readmission Risk Interventions Readmission Risk Prevention Plan 06/28/2018  Transportation Screening Complete  PCP or Specialist Appt within 3-5 Days Complete  HRI or [place] Complete  Social Work Consult for [place] Planning/Counseling Complete  Palliative Care Screening Not Applicable  Medication Review [employer]) Complete  Some recent data might be hidden

## 2018-07-05 DIAGNOSIS — I480 Paroxysmal atrial fibrillation: Secondary | ICD-10-CM | POA: Diagnosis not present

## 2018-07-05 DIAGNOSIS — I10 Essential (primary) hypertension: Secondary | ICD-10-CM | POA: Diagnosis not present

## 2018-07-05 DIAGNOSIS — T451X5A Adverse effect of antineoplastic and immunosuppressive drugs, initial encounter: Secondary | ICD-10-CM | POA: Diagnosis not present

## 2018-07-10 ENCOUNTER — Inpatient Hospital Stay: Payer: PPO

## 2018-07-10 ENCOUNTER — Other Ambulatory Visit: Payer: Self-pay | Admitting: *Deleted

## 2018-07-10 ENCOUNTER — Other Ambulatory Visit: Payer: Self-pay

## 2018-07-10 ENCOUNTER — Other Ambulatory Visit: Payer: PPO

## 2018-07-10 ENCOUNTER — Ambulatory Visit: Payer: PPO

## 2018-07-10 ENCOUNTER — Inpatient Hospital Stay (HOSPITAL_BASED_OUTPATIENT_CLINIC_OR_DEPARTMENT_OTHER): Payer: PPO | Admitting: Hematology & Oncology

## 2018-07-10 ENCOUNTER — Encounter: Payer: Self-pay | Admitting: Hematology & Oncology

## 2018-07-10 ENCOUNTER — Ambulatory Visit: Payer: PPO | Admitting: Hematology & Oncology

## 2018-07-10 VITALS — BP 124/85 | HR 89 | Temp 98.5°F | Resp 18 | Wt 251.0 lb

## 2018-07-10 DIAGNOSIS — Z5111 Encounter for antineoplastic chemotherapy: Secondary | ICD-10-CM | POA: Diagnosis not present

## 2018-07-10 DIAGNOSIS — C772 Secondary and unspecified malignant neoplasm of intra-abdominal lymph nodes: Secondary | ICD-10-CM

## 2018-07-10 DIAGNOSIS — Z79899 Other long term (current) drug therapy: Secondary | ICD-10-CM | POA: Diagnosis not present

## 2018-07-10 DIAGNOSIS — Z5112 Encounter for antineoplastic immunotherapy: Secondary | ICD-10-CM | POA: Diagnosis not present

## 2018-07-10 DIAGNOSIS — C679 Malignant neoplasm of bladder, unspecified: Secondary | ICD-10-CM

## 2018-07-10 DIAGNOSIS — K5900 Constipation, unspecified: Secondary | ICD-10-CM | POA: Diagnosis not present

## 2018-07-10 DIAGNOSIS — I4891 Unspecified atrial fibrillation: Secondary | ICD-10-CM | POA: Diagnosis not present

## 2018-07-10 DIAGNOSIS — D509 Iron deficiency anemia, unspecified: Secondary | ICD-10-CM | POA: Diagnosis not present

## 2018-07-10 DIAGNOSIS — Z95828 Presence of other vascular implants and grafts: Secondary | ICD-10-CM

## 2018-07-10 DIAGNOSIS — D508 Other iron deficiency anemias: Secondary | ICD-10-CM

## 2018-07-10 LAB — CBC WITH DIFFERENTIAL (CANCER CENTER ONLY)
Abs Immature Granulocytes: 0.04 10*3/uL (ref 0.00–0.07)
Basophils Absolute: 0.1 10*3/uL (ref 0.0–0.1)
Basophils Relative: 1 %
Eosinophils Absolute: 0.1 10*3/uL (ref 0.0–0.5)
Eosinophils Relative: 1 %
HCT: 29.2 % — ABNORMAL LOW (ref 39.0–52.0)
Hemoglobin: 9.5 g/dL — ABNORMAL LOW (ref 13.0–17.0)
Immature Granulocytes: 1 %
Lymphocytes Relative: 19 %
Lymphs Abs: 1.5 10*3/uL (ref 0.7–4.0)
MCH: 30.5 pg (ref 26.0–34.0)
MCHC: 32.5 g/dL (ref 30.0–36.0)
MCV: 93.9 fL (ref 80.0–100.0)
Monocytes Absolute: 1 10*3/uL (ref 0.1–1.0)
Monocytes Relative: 13 %
Neutro Abs: 5.2 10*3/uL (ref 1.7–7.7)
Neutrophils Relative %: 65 %
Platelet Count: 147 10*3/uL — ABNORMAL LOW (ref 150–400)
RBC: 3.11 MIL/uL — ABNORMAL LOW (ref 4.22–5.81)
RDW: 13.5 % (ref 11.5–15.5)
WBC Count: 7.9 10*3/uL (ref 4.0–10.5)
nRBC: 0 % (ref 0.0–0.2)

## 2018-07-10 LAB — CMP (CANCER CENTER ONLY)
ALT: 11 U/L (ref 0–44)
AST: 16 U/L (ref 15–41)
Albumin: 3.6 g/dL (ref 3.5–5.0)
Alkaline Phosphatase: 60 U/L (ref 38–126)
Anion gap: 12 (ref 5–15)
BUN: 21 mg/dL (ref 8–23)
CO2: 30 mmol/L (ref 22–32)
Calcium: 8.2 mg/dL — ABNORMAL LOW (ref 8.9–10.3)
Chloride: 97 mmol/L — ABNORMAL LOW (ref 98–111)
Creatinine: 1.43 mg/dL — ABNORMAL HIGH (ref 0.61–1.24)
GFR, Est AFR Am: 59 mL/min — ABNORMAL LOW (ref >60)
GFR, Estimated: 51 mL/min — ABNORMAL LOW (ref >60)
Glucose, Bld: 197 mg/dL — ABNORMAL HIGH (ref 70–99)
Potassium: 3.6 mmol/L (ref 3.5–5.1)
Sodium: 139 mmol/L (ref 135–145)
Total Bilirubin: 0.5 mg/dL (ref 0.3–1.2)
Total Protein: 6.1 g/dL — ABNORMAL LOW (ref 6.5–8.1)

## 2018-07-10 LAB — IRON AND TIBC
Iron: 83 ug/dL (ref 42–163)
Saturation Ratios: 41 % (ref 20–55)
TIBC: 204 ug/dL (ref 202–409)
UIBC: 121 ug/dL (ref 117–376)

## 2018-07-10 LAB — FERRITIN: Ferritin: 2232 ng/mL — ABNORMAL HIGH (ref 24–336)

## 2018-07-10 MED ORDER — HEPARIN SOD (PORK) LOCK FLUSH 100 UNIT/ML IV SOLN
500.0000 [IU] | Freq: Once | INTRAVENOUS | Status: AC
  Administered 2018-07-10: 500 [IU] via INTRAVENOUS
  Filled 2018-07-10: qty 5

## 2018-07-10 MED ORDER — GABAPENTIN 100 MG PO CAPS: 300.0000 mg | ORAL_CAPSULE | Freq: Two times a day (BID) | ORAL | 0 refills | Status: DC

## 2018-07-10 MED ORDER — SODIUM CHLORIDE 0.9% FLUSH
10.0000 mL | INTRAVENOUS | Status: DC | PRN
  Administered 2018-07-10: 10 mL via INTRAVENOUS
  Filled 2018-07-10: qty 10

## 2018-07-10 NOTE — Progress Notes (Signed)
DISCONTINUE ON PATHWAY REGIMEN - Bladder  No Medical Intervention - Off Treatment.  REASON: Other Reason PRIOR TREATMENT: Bladder35: End of Life Care/Hospice Care  START OFF PATHWAY REGIMEN - Bladder   [id number]:Enfortumab vedotin-ejfv 1.25 mg/kg IV D1,8,15 q28 Days:   A cycle is every 28 days:     Enfortumab vedotin-ejfv   **Always confirm dose/schedule in your pharmacy ordering system**  Patient Characteristics: Metastatic Disease, Third Line and Beyond Therapeutic Status: Metastatic Disease Line of Therapy: Third Line and Beyond  Intent of Therapy: Non-Curative / Palliative Intent, Discussed with Patient

## 2018-07-10 NOTE — Progress Notes (Signed)
Hematology and Oncology Follow Up Visit  SHAIN PAUWELS [id number] 25-Sep-1950 68 y.o. 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 Taxotere 80mg /m2 IV q 3 wks - s/p cycle #3 - d/c due to progression  Current Therapy:   M-VAC - s/p cycle #8  -- d/c on 07/10/2018 due to blood counts Padcev -- cycle #1 on 07/17/2018   Interim History: Mr. Rao is here today for follow-up.  Unfortunately, he was hospitalized a couple weeks ago because of atrial fibrillation.  He had profound pancytopenia.  He required multiple transfusions.  He is feeling better.  However, I just do not believe that he will be able to tolerate anymore the M-VAC therapy.  He has had 8 cycles.  I do think that even dose reduction count which we did last time, is not going to prevent him from a bottoming out his counts again.  Thankfully, the FDA has approved a new agent for bladder cancer.  This is called Padcev.  It is a new class of agent.  Has a decent response rate and pretreated urothelial tract cancers.  I think this would be a very good idea for him.  I think toxicity would be manageable.  We will have to watch out for neuropathy.  He is eating well.  He had mucositis when he was hospitalized.  We treated this but this only got better once his blood counts improved.  He has had no diarrhea.  He has had no bleeding.  He does have little bit of a rash on his lower legs.  We did do an EKG on him today.  This shows normal sinus rhythm.  He has first-degree AV block.  I think we can probably hold on anticoagulating him.  Maybe, the atrial fibrillation that had was reflective of his significant pancytopenia.  He overall has performed status of ECOG 1.     Medications:  Allergies as of 07/10/2018   No Known Allergies     Medication List       Accurate  as of July 10, 2018  2:27 PM. Always use your most recent med list.        Cinnamon 500 MG Tabs Take 1 tablet by mouth 2 (two) times daily.   diltiazem 240 MG 24 hr capsule Commonly known as:  CARDIZEM CD Take 1 capsule (240 mg total) by mouth daily.   gabapentin 100 MG capsule Commonly known as:  NEURONTIN Take 3 capsules (300 mg total) by mouth 2 (two) times daily.   losartan 100 MG tablet Commonly known as:  COZAAR Take 100 mg by mouth daily.   MAGNESIUM PO Take 400 mg by mouth at bedtime. Leg cramps   metoprolol tartrate 25 MG tablet Commonly known as:  LOPRESSOR Take 1 tablet (25 mg total) by mouth 2 (two) times daily.   senna 8.6 MG Tabs tablet Commonly known as:  SENOKOT Take 1 tablet (8.6 mg total) by mouth 2 (two) times daily.   vitamin B-6 250 MG tablet Take 1 tablet (250 mg total) by mouth daily.       Allergies: No Known Allergies  Past Medical History, Surgical history, Social history, and Family History were reviewed and updated.  Review of Systems: Review of Systems  Constitutional: Negative.   HENT: Negative.   Eyes: Negative.   Respiratory: Negative.  Cardiovascular: Negative.   Gastrointestinal: Negative.   Genitourinary: Negative.   Musculoskeletal: Negative.   Skin: Negative.   Neurological: Positive for tingling.  Endo/Heme/Allergies: Negative.   Psychiatric/Behavioral: Negative.      Physical Exam:  weight is 251 lb (113.9 kg). His oral temperature is 98.5 F (36.9 C). His blood pressure is 124/85 and his pulse is 89. His respiration is 18 and oxygen saturation is 96%.   Wt Readings from Last 3 Encounters:  07/10/18 251 lb (113.9 kg)  06/21/18 265 lb (120.2 kg)  06/12/18 264 lb 8 oz (120 kg)    Physical Exam Vitals signs reviewed.  HENT:     Head: Normocephalic and atraumatic.  Eyes:     Pupils: Pupils are equal, round, and reactive to light.  Neck:     Musculoskeletal: Normal range of motion.  Cardiovascular:      Rate and Rhythm: Normal rate and regular rhythm.     Heart sounds: Normal heart sounds.  Pulmonary:     Effort: Pulmonary effort is normal.     Breath sounds: Normal breath sounds.  Abdominal:     General: Bowel sounds are normal.     Palpations: Abdomen is soft.  Musculoskeletal: Normal range of motion.        General: No tenderness or deformity.  Lymphadenopathy:     Cervical: No cervical adenopathy.  Skin:    General: Skin is warm and dry.     Findings: No erythema or rash.  Neurological:     Mental Status: He is alert and oriented to person, place, and time.  Psychiatric:        Behavior: Behavior normal.        Thought Content: Thought content normal.        Judgment: Judgment normal.      Lab Results  Component Value Date   WBC 7.9 07/10/2018   HGB 9.5 (L) 07/10/2018   HCT 29.2 (L) 07/10/2018   MCV 93.9 07/10/2018   PLT 147 (L) 07/10/2018   Lab Results  Component Value Date   FERRITIN 2,232 (H) 07/10/2018   IRON 83 07/10/2018   TIBC 204 07/10/2018   UIBC 121 07/10/2018   IRONPCTSAT 41 07/10/2018   Lab Results  Component Value Date   RETICCTPCT 4.7 (H) 06/12/2018   RBC 3.11 (L) 07/10/2018   No results found for: KPAFRELGTCHN, LAMBDASER, KAPLAMBRATIO No results found for: IGGSERUM, IGA, IGMSERUM No results found for: [name], SPEI   Chemistry      Component Value Date/Time   NA 139 07/10/2018 0800   NA 141 02/21/2017 0902   K 3.6 07/10/2018 0800   K 4.1 02/21/2017 0902   CL 97 (L) 07/10/2018 0800   CL 98 02/21/2017 0902   CO2 30 07/10/2018 0800   CO2 31 02/21/2017 0902   BUN 21 07/10/2018 0800   BUN 17 02/21/2017 0902   CREATININE 1.43 (H) 07/10/2018 0800   CREATININE 1.2 02/21/2017 0902      Component Value Date/Time   CALCIUM 8.2 (L) 07/10/2018 0800   CALCIUM 9.2 02/21/2017 0902   ALKPHOS 60 07/10/2018 0800   ALKPHOS 65 02/21/2017 0902   AST 16 07/10/2018 0800   ALT 11 07/10/2018  0800   ALT 18 02/21/2017 0902   BILITOT 0.5 07/10/2018 0800       Impression and Plan: Mr. Swigert is a very pleasant 68 year old Caucasian gentleman with metastatic high grade bladder cancer.   Again, we  are going to have to make a change in his protocol.  We will have to switch him over to Padcev.  I think this would be very reasonable for him.  Unfortunately, there is no protocol that we can use on him yet.  Nothing is written into the EPIC system.  Hopefully, this will be done before next week.  I spent about 45 minutes with him today.  I went over my recommendations.  All time spent face-to-face.  I counseled him.  I did my best to try to get the protocol entered but cannot.  We will start him next week.  Treatment is weekly for 3 weeks on and one-week off.  I will plan to see him back the start of his second cycle of treatment.  I probably would do 2 cycles of treatment and then repeat his PET scan.    Volanda Napoleon, MD 3/30/20202:27 PM

## 2018-07-10 NOTE — Addendum Note (Signed)
Addended by: Rico Ala on: 07/10/2018 08:47 AM   Modules accepted: Orders, SmartSet

## 2018-07-10 NOTE — Patient Instructions (Signed)
Implanted Port Insertion, Care After  This sheet gives you information about how to care for yourself after your procedure. Your health care provider may also give you more specific instructions. If you have problems or questions, contact your health care provider.  What can I expect after the procedure?  After the procedure, it is common to have:  · Discomfort at the port insertion site.  · Bruising on the skin over the port. This should improve over 3-4 days.  Follow these instructions at home:  Port care  · After your port is placed, you will get a manufacturer's information card. The card has information about your port. Keep this card with you at all times.  · Take care of the port as told by your health care provider. Ask your health care provider if you or a family member can get training for taking care of the port at home. A home health care nurse may also take care of the port.  · Make sure to remember what type of port you have.  Incision care         · Follow instructions from your health care provider about how to take care of your port insertion site. Make sure you:  ? Wash your hands with soap and water before and after you change your bandage (dressing). If soap and water are not available, use hand sanitizer.  ? Change your dressing as told by your health care provider.  ? Leave stitches (sutures), skin glue, or adhesive strips in place. These skin closures may need to stay in place for 2 weeks or longer. If adhesive strip edges start to loosen and curl up, you may trim the loose edges. Do not remove adhesive strips completely unless your health care provider tells you to do that.  · Check your port insertion site every day for signs of infection. Check for:  ? Redness, swelling, or pain.  ? Fluid or blood.  ? Warmth.  ? Pus or a bad smell.  Activity  · Return to your normal activities as told by your health care provider. Ask your health care provider what activities are safe for you.  · Do not  lift anything that is heavier than 10 lb (4.5 kg), or the limit that you are told, until your health care provider says that it is safe.  General instructions  · Take over-the-counter and prescription medicines only as told by your health care provider.  · Do not take baths, swim, or use a hot tub until your health care provider approves. Ask your health care provider if you may take showers. You may only be allowed to take sponge baths.  · Do not drive for 24 hours if you were given a sedative during your procedure.  · Wear a medical alert bracelet in case of an emergency. This will tell any health care providers that you have a port.  · Keep all follow-up visits as told by your health care provider. This is important.  Contact a health care provider if:  · You cannot flush your port with saline as directed, or you cannot draw blood from the port.  · You have a fever or chills.  · You have redness, swelling, or pain around your port insertion site.  · You have fluid or blood coming from your port insertion site.  · Your port insertion site feels warm to the touch.  · You have pus or a bad smell coming from the port   insertion site.  Get help right away if:  · You have chest pain or shortness of breath.  · You have bleeding from your port that you cannot control.  Summary  · Take care of the port as told by your health care provider. Keep the manufacturer's information card with you at all times.  · Change your dressing as told by your health care provider.  · Contact a health care provider if you have a fever or chills or if you have redness, swelling, or pain around your port insertion site.  · Keep all follow-up visits as told by your health care provider.  This information is not intended to replace advice given to you by your health care provider. Make sure you discuss any questions you have with your health care provider.  Document Released: 01/17/2013 Document Revised: 10/25/2017 Document Reviewed:  10/25/2017  Elsevier Interactive Patient Education © 2019 [employer]

## 2018-07-11 ENCOUNTER — Ambulatory Visit: Payer: PPO

## 2018-07-11 LAB — ERYTHROPOIETIN: Erythropoietin: 33.9 m[IU]/mL — ABNORMAL HIGH (ref 2.6–18.5)

## 2018-07-11 NOTE — Addendum Note (Signed)
Addended by: Burney Gauze R on: 07/11/2018 12:17 PM   Modules accepted: Orders

## 2018-07-14 ENCOUNTER — Other Ambulatory Visit: Payer: Self-pay

## 2018-07-14 DIAGNOSIS — C772 Secondary and unspecified malignant neoplasm of intra-abdominal lymph nodes: Secondary | ICD-10-CM

## 2018-07-14 DIAGNOSIS — C679 Malignant neoplasm of bladder, unspecified: Secondary | ICD-10-CM

## 2018-07-17 ENCOUNTER — Inpatient Hospital Stay: Payer: PPO | Attending: Hematology & Oncology

## 2018-07-17 ENCOUNTER — Inpatient Hospital Stay: Payer: PPO

## 2018-07-17 ENCOUNTER — Other Ambulatory Visit: Payer: Self-pay

## 2018-07-17 ENCOUNTER — Other Ambulatory Visit: Payer: Self-pay | Admitting: Hematology & Oncology

## 2018-07-17 DIAGNOSIS — C679 Malignant neoplasm of bladder, unspecified: Secondary | ICD-10-CM | POA: Diagnosis not present

## 2018-07-17 DIAGNOSIS — Z5111 Encounter for antineoplastic chemotherapy: Secondary | ICD-10-CM | POA: Diagnosis not present

## 2018-07-17 DIAGNOSIS — C772 Secondary and unspecified malignant neoplasm of intra-abdominal lymph nodes: Secondary | ICD-10-CM

## 2018-07-17 DIAGNOSIS — K5780 Diverticulitis of intestine, part unspecified, with perforation and abscess without bleeding: Secondary | ICD-10-CM | POA: Diagnosis not present

## 2018-07-17 DIAGNOSIS — Z79899 Other long term (current) drug therapy: Secondary | ICD-10-CM | POA: Diagnosis not present

## 2018-07-17 LAB — CMP (CANCER CENTER ONLY)
ALT: 9 U/L (ref 0–44)
AST: 15 U/L (ref 15–41)
Albumin: 3.7 g/dL (ref 3.5–5.0)
Alkaline Phosphatase: 52 U/L (ref 38–126)
Anion gap: 11 (ref 5–15)
BUN: 28 mg/dL — ABNORMAL HIGH (ref 8–23)
CO2: 29 mmol/L (ref 22–32)
Calcium: 8.6 mg/dL — ABNORMAL LOW (ref 8.9–10.3)
Chloride: 99 mmol/L (ref 98–111)
Creatinine: 1.71 mg/dL — ABNORMAL HIGH (ref 0.61–1.24)
GFR, Est AFR Am: 47 mL/min — ABNORMAL LOW (ref >60)
GFR, Estimated: 41 mL/min — ABNORMAL LOW (ref >60)
Glucose, Bld: 181 mg/dL — ABNORMAL HIGH (ref 70–99)
Potassium: 3.7 mmol/L (ref 3.5–5.1)
Sodium: 139 mmol/L (ref 135–145)
Total Bilirubin: 0.5 mg/dL (ref 0.3–1.2)
Total Protein: 6.1 g/dL — ABNORMAL LOW (ref 6.5–8.1)

## 2018-07-17 LAB — CBC WITH DIFFERENTIAL (CANCER CENTER ONLY)
Abs Immature Granulocytes: 0.03 10*3/uL (ref 0.00–0.07)
Basophils Absolute: 0 10*3/uL (ref 0.0–0.1)
Basophils Relative: 0 %
Eosinophils Absolute: 0.2 10*3/uL (ref 0.0–0.5)
Eosinophils Relative: 3 %
HCT: 27.8 % — ABNORMAL LOW (ref 39.0–52.0)
Hemoglobin: 9.1 g/dL — ABNORMAL LOW (ref 13.0–17.0)
Immature Granulocytes: 0 %
Lymphocytes Relative: 21 %
Lymphs Abs: 1.4 10*3/uL (ref 0.7–4.0)
MCH: 31.1 pg (ref 26.0–34.0)
MCHC: 32.7 g/dL (ref 30.0–36.0)
MCV: 94.9 fL (ref 80.0–100.0)
Monocytes Absolute: 0.7 10*3/uL (ref 0.1–1.0)
Monocytes Relative: 10 %
Neutro Abs: 4.3 10*3/uL (ref 1.7–7.7)
Neutrophils Relative %: 66 %
Platelet Count: 165 10*3/uL (ref 150–400)
RBC: 2.93 MIL/uL — ABNORMAL LOW (ref 4.22–5.81)
RDW: 14.3 % (ref 11.5–15.5)
WBC Count: 6.7 10*3/uL (ref 4.0–10.5)
nRBC: 0 % (ref 0.0–0.2)

## 2018-07-17 MED ORDER — PROCHLORPERAZINE MALEATE 10 MG PO TABS: 10.0000 mg | ORAL_TABLET | Freq: Four times a day (QID) | ORAL | 1 refills | Status: DC | PRN

## 2018-07-17 MED ORDER — LIDOCAINE-PRILOCAINE 2.5-2.5 % EX CREA: TOPICAL_CREAM | CUTANEOUS | 3 refills | Status: DC

## 2018-07-17 MED ORDER — SODIUM CHLORIDE 0.9 % IV SOLN
125.0000 mg | Freq: Once | INTRAVENOUS | Status: AC
  Administered 2018-07-17: 125 mg via INTRAVENOUS
  Filled 2018-07-17: qty 12.5

## 2018-07-17 MED ORDER — DEXAMETHASONE 4 MG PO TABS: 8.0000 mg | ORAL_TABLET | Freq: Every day | ORAL | 1 refills | Status: DC

## 2018-07-17 MED ORDER — DEXAMETHASONE SODIUM PHOSPHATE 10 MG/ML IJ SOLN
10.0000 mg | Freq: Once | INTRAMUSCULAR | Status: AC
  Administered 2018-07-17: 10 mg via INTRAVENOUS

## 2018-07-17 MED ORDER — LORAZEPAM 0.5 MG PO TABS: 0.5000 mg | ORAL_TABLET | Freq: Four times a day (QID) | ORAL | 0 refills | Status: DC | PRN

## 2018-07-17 MED ORDER — SODIUM CHLORIDE 0.9% FLUSH
10.0000 mL | INTRAVENOUS | Status: DC | PRN
  Administered 2018-07-17: 10 mL
  Filled 2018-07-17: qty 10

## 2018-07-17 MED ORDER — ONDANSETRON HCL 8 MG PO TABS: 8.0000 mg | ORAL_TABLET | Freq: Two times a day (BID) | ORAL | 1 refills | Status: DC | PRN

## 2018-07-17 MED ORDER — DEXAMETHASONE SODIUM PHOSPHATE 10 MG/ML IJ SOLN
INTRAMUSCULAR | Status: AC
  Filled 2018-07-17: qty 1

## 2018-07-17 MED ORDER — PALONOSETRON HCL INJECTION 0.25 MG/5ML
INTRAVENOUS | Status: AC
  Filled 2018-07-17: qty 5

## 2018-07-17 MED ORDER — PALONOSETRON HCL INJECTION 0.25 MG/5ML
0.2500 mg | Freq: Once | INTRAVENOUS | Status: AC
  Administered 2018-07-17: 0.25 mg via INTRAVENOUS

## 2018-07-17 MED ORDER — SODIUM CHLORIDE 0.9 % IV SOLN
125.0000 mg | Freq: Once | INTRAVENOUS | Status: DC
  Filled 2018-07-17: qty 12.5

## 2018-07-17 MED ORDER — SODIUM CHLORIDE 0.9 % IV SOLN
Freq: Once | INTRAVENOUS | Status: AC
  Administered 2018-07-17: 10:00:00 via INTRAVENOUS
  Filled 2018-07-17: qty 250

## 2018-07-17 MED ORDER — HEPARIN SOD (PORK) LOCK FLUSH 100 UNIT/ML IV SOLN
500.0000 [IU] | Freq: Once | INTRAVENOUS | Status: AC | PRN
  Administered 2018-07-17: 500 [IU]
  Filled 2018-07-17: qty 5

## 2018-07-17 NOTE — Progress Notes (Signed)
CBC and CMET reviewed with MD, ok to treat.

## 2018-07-17 NOTE — Patient Instructions (Addendum)
[facility] Discharge Instructions for Patients Receiving Chemotherapy  Today you received the following chemotherapy agents Padcev  To help prevent nausea and vomiting after your treatment, we encourage you to take your nausea medication as prescribed.    If you develop nausea and vomiting that is not controlled by your nausea medication, call the clinic.   BELOW ARE SYMPTOMS THAT SHOULD BE REPORTED IMMEDIATELY:  *FEVER GREATER THAN 100.5 F  *CHILLS WITH OR WITHOUT FEVER  NAUSEA AND VOMITING THAT IS NOT CONTROLLED WITH YOUR NAUSEA MEDICATION  *UNUSUAL SHORTNESS OF BREATH  *UNUSUAL BRUISING OR BLEEDING  TENDERNESS IN MOUTH AND THROAT WITH OR WITHOUT PRESENCE OF ULCERS  *URINARY PROBLEMS  *BOWEL PROBLEMS  UNUSUAL RASH Items with * indicate a potential emergency and should be followed up as soon as possible.  Feel free to call the clinic should you have any questions or concerns. The clinic phone number is [phone].  Please show the [facility] at check-in to the Emergency Department and triage nurse.

## 2018-07-18 ENCOUNTER — Other Ambulatory Visit: Payer: Self-pay | Admitting: Hematology & Oncology

## 2018-07-24 ENCOUNTER — Inpatient Hospital Stay: Payer: PPO

## 2018-07-24 ENCOUNTER — Inpatient Hospital Stay (HOSPITAL_BASED_OUTPATIENT_CLINIC_OR_DEPARTMENT_OTHER): Payer: PPO | Admitting: Hematology & Oncology

## 2018-07-24 ENCOUNTER — Other Ambulatory Visit: Payer: Self-pay

## 2018-07-24 ENCOUNTER — Encounter: Payer: Self-pay | Admitting: Hematology & Oncology

## 2018-07-24 VITALS — Wt 251.0 lb

## 2018-07-24 DIAGNOSIS — C679 Malignant neoplasm of bladder, unspecified: Secondary | ICD-10-CM

## 2018-07-24 DIAGNOSIS — Z79899 Other long term (current) drug therapy: Secondary | ICD-10-CM | POA: Diagnosis not present

## 2018-07-24 DIAGNOSIS — Z5111 Encounter for antineoplastic chemotherapy: Secondary | ICD-10-CM

## 2018-07-24 DIAGNOSIS — K5780 Diverticulitis of intestine, part unspecified, with perforation and abscess without bleeding: Secondary | ICD-10-CM

## 2018-07-24 DIAGNOSIS — C772 Secondary and unspecified malignant neoplasm of intra-abdominal lymph nodes: Secondary | ICD-10-CM

## 2018-07-24 LAB — CMP (CANCER CENTER ONLY)
ALT: 14 U/L (ref 0–44)
AST: 20 U/L (ref 15–41)
Albumin: 3.7 g/dL (ref 3.5–5.0)
Alkaline Phosphatase: 50 U/L (ref 38–126)
Anion gap: 7 (ref 5–15)
BUN: 34 mg/dL — ABNORMAL HIGH (ref 8–23)
CO2: 29 mmol/L (ref 22–32)
Calcium: 8.5 mg/dL — ABNORMAL LOW (ref 8.9–10.3)
Chloride: 99 mmol/L (ref 98–111)
Creatinine: 1.59 mg/dL — ABNORMAL HIGH (ref 0.61–1.24)
GFR, Est AFR Am: 52 mL/min — ABNORMAL LOW (ref >60)
GFR, Estimated: 45 mL/min — ABNORMAL LOW (ref >60)
Glucose, Bld: 163 mg/dL — ABNORMAL HIGH (ref 70–99)
Potassium: 3.5 mmol/L (ref 3.5–5.1)
Sodium: 135 mmol/L (ref 135–145)
Total Bilirubin: 0.5 mg/dL (ref 0.3–1.2)
Total Protein: 5.8 g/dL — ABNORMAL LOW (ref 6.5–8.1)

## 2018-07-24 LAB — CBC WITH DIFFERENTIAL (CANCER CENTER ONLY)
Abs Immature Granulocytes: 0.13 10*3/uL — ABNORMAL HIGH (ref 0.00–0.07)
Basophils Absolute: 0 10*3/uL (ref 0.0–0.1)
Basophils Relative: 0 %
Eosinophils Absolute: 0.3 10*3/uL (ref 0.0–0.5)
Eosinophils Relative: 3 %
HCT: 30.8 % — ABNORMAL LOW (ref 39.0–52.0)
Hemoglobin: 10.1 g/dL — ABNORMAL LOW (ref 13.0–17.0)
Immature Granulocytes: 1 %
Lymphocytes Relative: 16 %
Lymphs Abs: 2 10*3/uL (ref 0.7–4.0)
MCH: 31.1 pg (ref 26.0–34.0)
MCHC: 32.8 g/dL (ref 30.0–36.0)
MCV: 94.8 fL (ref 80.0–100.0)
Monocytes Absolute: 1.2 10*3/uL — ABNORMAL HIGH (ref 0.1–1.0)
Monocytes Relative: 9 %
Neutro Abs: 9 10*3/uL — ABNORMAL HIGH (ref 1.7–7.7)
Neutrophils Relative %: 71 %
Platelet Count: 146 10*3/uL — ABNORMAL LOW (ref 150–400)
RBC: 3.25 MIL/uL — ABNORMAL LOW (ref 4.22–5.81)
RDW: 14.8 % (ref 11.5–15.5)
WBC Count: 12.6 10*3/uL — ABNORMAL HIGH (ref 4.0–10.5)
nRBC: 0 % (ref 0.0–0.2)

## 2018-07-24 LAB — RETICULOCYTES
Immature Retic Fract: 12.1 % (ref 2.3–15.9)
RBC.: 3.21 MIL/uL — ABNORMAL LOW (ref 4.22–5.81)
Retic Count, Absolute: 91.5 10*3/uL (ref 19.0–186.0)
Retic Ct Pct: 2.9 % (ref 0.4–3.1)

## 2018-07-24 LAB — LACTATE DEHYDROGENASE: LDH: 229 U/L — ABNORMAL HIGH (ref 98–192)

## 2018-07-24 MED ORDER — HEPARIN SOD (PORK) LOCK FLUSH 100 UNIT/ML IV SOLN
500.0000 [IU] | Freq: Once | INTRAVENOUS | Status: AC | PRN
  Administered 2018-07-24: 500 [IU]
  Filled 2018-07-24: qty 5

## 2018-07-24 MED ORDER — PALONOSETRON HCL INJECTION 0.25 MG/5ML
0.2500 mg | Freq: Once | INTRAVENOUS | Status: AC
  Administered 2018-07-24: 0.25 mg via INTRAVENOUS

## 2018-07-24 MED ORDER — DEXAMETHASONE SODIUM PHOSPHATE 10 MG/ML IJ SOLN
10.0000 mg | Freq: Once | INTRAMUSCULAR | Status: AC
  Administered 2018-07-24: 10 mg via INTRAVENOUS

## 2018-07-24 MED ORDER — SODIUM CHLORIDE 0.9 % IV SOLN
Freq: Once | INTRAVENOUS | Status: AC
  Administered 2018-07-24: 14:00:00 via INTRAVENOUS
  Filled 2018-07-24: qty 250

## 2018-07-24 MED ORDER — DEXAMETHASONE SODIUM PHOSPHATE 10 MG/ML IJ SOLN
INTRAMUSCULAR | Status: AC
  Filled 2018-07-24: qty 1

## 2018-07-24 MED ORDER — SODIUM CHLORIDE 0.9 % IV SOLN
125.0000 mg | Freq: Once | INTRAVENOUS | Status: AC
  Administered 2018-07-24: 125 mg via INTRAVENOUS
  Filled 2018-07-24: qty 10

## 2018-07-24 MED ORDER — SODIUM CHLORIDE 0.9% FLUSH
10.0000 mL | INTRAVENOUS | Status: DC | PRN
  Administered 2018-07-24: 10 mL
  Filled 2018-07-24: qty 10

## 2018-07-24 NOTE — Progress Notes (Signed)
OK to treat with creatinine of 1.59 per Dr Marin Olp. dph

## 2018-07-24 NOTE — Patient Instructions (Signed)
[facility] Discharge Instructions for Patients Receiving Chemotherapy  Today you received the following chemotherapy agents Padcev  To help prevent nausea and vomiting after your treatment, we encourage you to take your nausea medication. If you develop nausea and vomiting that is not controlled by your nausea medication, call the clinic.   BELOW ARE SYMPTOMS THAT SHOULD BE REPORTED IMMEDIATELY:  *FEVER GREATER THAN 100.5 F  *CHILLS WITH OR WITHOUT FEVER  NAUSEA AND VOMITING THAT IS NOT CONTROLLED WITH YOUR NAUSEA MEDICATION  *UNUSUAL SHORTNESS OF BREATH  *UNUSUAL BRUISING OR BLEEDING  TENDERNESS IN MOUTH AND THROAT WITH OR WITHOUT PRESENCE OF ULCERS  *URINARY PROBLEMS  *BOWEL PROBLEMS  UNUSUAL RASH Items with * indicate a potential emergency and should be followed up as soon as possible.  Feel free to call the clinic should you have any questions or concerns. The clinic phone number is [phone].  Please show the [facility] at check-in to the Emergency Department and triage nurse.

## 2018-07-24 NOTE — Progress Notes (Signed)
Hematology and Oncology Follow Up Visit  [name] Rose [id number] 03/30/1951 68 y.o. 07/24/2018   Principle Diagnosis:  Metastatic high-grade bladder cancer - recurrent Diverticular abscess - E.coli  Past Therapy: Atezolizumab 1200mg  IV q 3 wks - s/p cycle #4 - d/c due to progression. Taxotere 80mg /m2 IV q 3 wks - s/p cycle #3 - d/c due to progression  Current Therapy:   M-VAC - s/p cycle #8  -- d/c on 07/10/2018 due to blood counts Padcev -- cycle #1 on 07/17/2018   Interim History: [name] Rose is here today for follow-up.  So far, he is tolerated treatment pretty well.  He has had 1 dose of the Padcev.  He has had no issues today.  There is no cough.  He has had no shortness of breath.  He has had no diarrhea.  He has had no nausea or vomiting.  He has had no rashes.  He has family to have a very nice Easter weekend.  There is been no fever.  He overall has performed status of ECOG 1.     Medications:  Allergies as of 07/24/2018   No Known Allergies     Medication List       Accurate as of July 24, 2018  1:41 PM. Always use your most recent med list.        Cinnamon 500 MG Tabs Take 1 tablet by mouth 2 (two) times daily.   dexamethasone 4 MG tablet Commonly known as:  DECADRON Take 2 tablets (8 mg total) by mouth daily. Start the day after chemotherapy for 2 days.   diltiazem 240 MG 24 hr capsule Commonly known as:  CARDIZEM CD Take 1 capsule (240 mg total) by mouth daily.   gabapentin 100 MG capsule Commonly known as:  NEURONTIN Take 3 capsules (300 mg total) by mouth 2 (two) times daily.   lidocaine-prilocaine cream Commonly known as:  EMLA Apply to affected area once   LORazepam 0.5 MG tablet Commonly known as:  Ativan Take 1 tablet (0.5 mg total) by mouth every 6 (six) hours as needed (Nausea or vomiting).   losartan 100 MG tablet Commonly known as:  COZAAR Take 100 mg by mouth daily.   MAGNESIUM PO Take 400 mg by mouth at bedtime. Leg  cramps   metoprolol tartrate 25 MG tablet Commonly known as:  LOPRESSOR Take 1 tablet (25 mg total) by mouth 2 (two) times daily.   ondansetron 8 MG tablet Commonly known as:  Zofran Take 1 tablet (8 mg total) by mouth 2 (two) times daily as needed for refractory nausea / vomiting. Start on day 3 after chemo.   prochlorperazine 10 MG tablet Commonly known as:  COMPAZINE Take 1 tablet (10 mg total) by mouth every 6 (six) hours as needed (Nausea or vomiting).   senna 8.6 MG Tabs tablet Commonly known as:  SENOKOT Take 1 tablet (8.6 mg total) by mouth 2 (two) times daily.   vitamin B-6 250 MG tablet Take 1 tablet (250 mg total) by mouth daily.       Allergies: No Known Allergies  Past Medical History, Surgical history, Social history, and Family History were reviewed and updated.  Review of Systems: Review of Systems  Constitutional: Negative.   HENT: Negative.   Eyes: Negative.   Respiratory: Negative.   Cardiovascular: Negative.   Gastrointestinal: Negative.   Genitourinary: Negative.   Musculoskeletal: Negative.   Skin: Negative.   Neurological: Positive for tingling.  Endo/Heme/Allergies: Negative.   Psychiatric/Behavioral:  Negative.      Physical Exam:  weight is 251 lb (113.9 kg).   Wt Readings from Last 3 Encounters:  07/24/18 251 lb (113.9 kg)  07/10/18 251 lb (113.9 kg)  06/21/18 265 lb (120.2 kg)    Physical Exam Vitals signs reviewed.  HENT:     Head: Normocephalic and atraumatic.  Eyes:     Pupils: Pupils are equal, round, and reactive to light.  Neck:     Musculoskeletal: Normal range of motion.  Cardiovascular:     Rate and Rhythm: Normal rate and regular rhythm.     Heart sounds: Normal heart sounds.  Pulmonary:     Effort: Pulmonary effort is normal.     Breath sounds: Normal breath sounds.  Abdominal:     General: Bowel sounds are normal.     Palpations: Abdomen is soft.  Musculoskeletal: Normal range of motion.        General: No  tenderness or deformity.  Lymphadenopathy:     Cervical: No cervical adenopathy.  Skin:    General: Skin is warm and dry.     Findings: No erythema or rash.  Neurological:     Mental Status: He is alert and oriented to person, place, and time.  Psychiatric:        Behavior: Behavior normal.        Thought Content: Thought content normal.        Judgment: Judgment normal.      Lab Results  Component Value Date   WBC 12.6 (H) 07/24/2018   HGB 10.1 (L) 07/24/2018   HCT 30.8 (L) 07/24/2018   MCV 94.8 07/24/2018   PLT 146 (L) 07/24/2018   Lab Results  Component Value Date   FERRITIN 2,232 (H) 07/10/2018   IRON 83 07/10/2018   TIBC 204 07/10/2018   UIBC 121 07/10/2018   IRONPCTSAT 41 07/10/2018   Lab Results  Component Value Date   RETICCTPCT 2.9 07/24/2018   RBC 3.25 (L) 07/24/2018   RBC 3.21 (L) 07/24/2018   No results found for: KPAFRELGTCHN, LAMBDASER, KAPLAMBRATIO No results found for: IGGSERUM, IGA, IGMSERUM No results found for: [name], SPEI   Chemistry      Component Value Date/Time   NA 135 07/24/2018 1230   NA 141 02/21/2017 0902   K 3.5 07/24/2018 1230   K 4.1 02/21/2017 0902   CL 99 07/24/2018 1230   CL 98 02/21/2017 0902   CO2 29 07/24/2018 1230   CO2 31 02/21/2017 0902   BUN 34 (H) 07/24/2018 1230   BUN 17 02/21/2017 0902   CREATININE 1.59 (H) 07/24/2018 1230   CREATININE 1.2 02/21/2017 0902      Component Value Date/Time   CALCIUM 8.5 (L) 07/24/2018 1230   CALCIUM 9.2 02/21/2017 0902   ALKPHOS 50 07/24/2018 1230   ALKPHOS 65 02/21/2017 0902   AST 20 07/24/2018 1230   ALT 14 07/24/2018 1230   ALT 18 02/21/2017 0902   BILITOT 0.5 07/24/2018 1230       Impression and Plan: [name] Rose is a very pleasant 68 year old Caucasian gentleman with metastatic high grade bladder cancer.   Hopefully, he will tolerate the Padcev pretty well.  I will plan to get him back to see me on cycle  number 2-day 1.  He certainly knows to call if he has any issues.  Again, I will do 2 cycles of treatment and then repeat his scans.  Volanda Napoleon, MD 4/13/20201:41  PM

## 2018-07-24 NOTE — Patient Instructions (Signed)

## 2018-07-25 LAB — IRON AND TIBC
Iron: 88 ug/dL (ref 42–163)
Saturation Ratios: 37 % (ref 20–55)
TIBC: 240 ug/dL (ref 202–409)
UIBC: 152 ug/dL (ref 117–376)

## 2018-07-25 LAB — FERRITIN: Ferritin: 2532 ng/mL — ABNORMAL HIGH (ref 24–336)

## 2018-07-31 ENCOUNTER — Other Ambulatory Visit: Payer: Self-pay

## 2018-07-31 ENCOUNTER — Inpatient Hospital Stay: Payer: PPO

## 2018-07-31 DIAGNOSIS — C679 Malignant neoplasm of bladder, unspecified: Secondary | ICD-10-CM

## 2018-07-31 DIAGNOSIS — C772 Secondary and unspecified malignant neoplasm of intra-abdominal lymph nodes: Secondary | ICD-10-CM

## 2018-07-31 DIAGNOSIS — Z5111 Encounter for antineoplastic chemotherapy: Secondary | ICD-10-CM | POA: Diagnosis not present

## 2018-07-31 LAB — CBC WITH DIFFERENTIAL (CANCER CENTER ONLY)
Abs Immature Granulocytes: 0.15 10*3/uL — ABNORMAL HIGH (ref 0.00–0.07)
Basophils Absolute: 0 10*3/uL (ref 0.0–0.1)
Basophils Relative: 0 %
Eosinophils Absolute: 0.4 10*3/uL (ref 0.0–0.5)
Eosinophils Relative: 3 %
HCT: 32.8 % — ABNORMAL LOW (ref 39.0–52.0)
Hemoglobin: 10.8 g/dL — ABNORMAL LOW (ref 13.0–17.0)
Immature Granulocytes: 1 %
Lymphocytes Relative: 14 %
Lymphs Abs: 2.4 10*3/uL (ref 0.7–4.0)
MCH: 31.5 pg (ref 26.0–34.0)
MCHC: 32.9 g/dL (ref 30.0–36.0)
MCV: 95.6 fL (ref 80.0–100.0)
Monocytes Absolute: 1.4 10*3/uL — ABNORMAL HIGH (ref 0.1–1.0)
Monocytes Relative: 8 %
Neutro Abs: 12.1 10*3/uL — ABNORMAL HIGH (ref 1.7–7.7)
Neutrophils Relative %: 74 %
Platelet Count: 154 10*3/uL (ref 150–400)
RBC: 3.43 MIL/uL — ABNORMAL LOW (ref 4.22–5.81)
RDW: 14.9 % (ref 11.5–15.5)
WBC Count: 16.4 10*3/uL — ABNORMAL HIGH (ref 4.0–10.5)
nRBC: 0.1 % (ref 0.0–0.2)

## 2018-07-31 LAB — LACTATE DEHYDROGENASE: LDH: 258 U/L — ABNORMAL HIGH (ref 98–192)

## 2018-07-31 LAB — CMP (CANCER CENTER ONLY)
ALT: 20 U/L (ref 0–44)
AST: 20 U/L (ref 15–41)
Albumin: 3.8 g/dL (ref 3.5–5.0)
Alkaline Phosphatase: 43 U/L (ref 38–126)
Anion gap: 11 (ref 5–15)
BUN: 36 mg/dL — ABNORMAL HIGH (ref 8–23)
CO2: 27 mmol/L (ref 22–32)
Calcium: 8.9 mg/dL (ref 8.9–10.3)
Chloride: 100 mmol/L (ref 98–111)
Creatinine: 1.8 mg/dL — ABNORMAL HIGH (ref 0.61–1.24)
GFR, Est AFR Am: 44 mL/min — ABNORMAL LOW (ref >60)
GFR, Est Non Af Am: 38 mL/min — ABNORMAL LOW (ref >60)
Glucose, Bld: 142 mg/dL — ABNORMAL HIGH (ref 70–99)
Potassium: 3.5 mmol/L (ref 3.5–5.1)
Sodium: 138 mmol/L (ref 135–145)
Total Bilirubin: 0.7 mg/dL (ref 0.3–1.2)
Total Protein: 5.8 g/dL — ABNORMAL LOW (ref 6.5–8.1)

## 2018-07-31 MED ORDER — PALONOSETRON HCL INJECTION 0.25 MG/5ML
INTRAVENOUS | Status: AC
  Filled 2018-07-31: qty 5

## 2018-07-31 MED ORDER — SODIUM CHLORIDE 0.9% FLUSH
10.0000 mL | INTRAVENOUS | Status: DC | PRN
  Administered 2018-07-31: 10 mL
  Filled 2018-07-31: qty 10

## 2018-07-31 MED ORDER — SODIUM CHLORIDE 0.9 % IV SOLN
125.0000 mg | Freq: Once | INTRAVENOUS | Status: AC
  Administered 2018-07-31: 125 mg via INTRAVENOUS
  Filled 2018-07-31: qty 3

## 2018-07-31 MED ORDER — HEPARIN SOD (PORK) LOCK FLUSH 100 UNIT/ML IV SOLN
500.0000 [IU] | Freq: Once | INTRAVENOUS | Status: AC | PRN
  Administered 2018-07-31: 500 [IU]
  Filled 2018-07-31: qty 5

## 2018-07-31 MED ORDER — PALONOSETRON HCL INJECTION 0.25 MG/5ML
0.2500 mg | Freq: Once | INTRAVENOUS | Status: AC
  Administered 2018-07-31: 0.25 mg via INTRAVENOUS

## 2018-07-31 MED ORDER — DEXAMETHASONE SODIUM PHOSPHATE 10 MG/ML IJ SOLN
INTRAMUSCULAR | Status: AC
  Filled 2018-07-31: qty 1

## 2018-07-31 MED ORDER — SODIUM CHLORIDE 0.9 % IV SOLN
Freq: Once | INTRAVENOUS | Status: AC
  Administered 2018-07-31: 10:00:00 via INTRAVENOUS
  Filled 2018-07-31: qty 250

## 2018-07-31 MED ORDER — HEPARIN SOD (PORK) LOCK FLUSH 100 UNIT/ML IV SOLN
250.0000 [IU] | Freq: Once | INTRAVENOUS | Status: AC | PRN
  Administered 2018-07-31: 500 [IU]
  Filled 2018-07-31: qty 5

## 2018-07-31 MED ORDER — DEXAMETHASONE SODIUM PHOSPHATE 10 MG/ML IJ SOLN
10.0000 mg | Freq: Once | INTRAMUSCULAR | Status: AC
  Administered 2018-07-31: 10 mg via INTRAVENOUS

## 2018-07-31 NOTE — Patient Instructions (Signed)
[facility] Discharge Instructions for Patients Receiving Chemotherapy  Today you received the following chemotherapy agents Padcev  To help prevent nausea and vomiting after your treatment, we encourage you to take your nausea medication    If you develop nausea and vomiting that is not controlled by your nausea medication, call the clinic.   BELOW ARE SYMPTOMS THAT SHOULD BE REPORTED IMMEDIATELY:  *FEVER GREATER THAN 100.5 F  *CHILLS WITH OR WITHOUT FEVER  NAUSEA AND VOMITING THAT IS NOT CONTROLLED WITH YOUR NAUSEA MEDICATION  *UNUSUAL SHORTNESS OF BREATH  *UNUSUAL BRUISING OR BLEEDING  TENDERNESS IN MOUTH AND THROAT WITH OR WITHOUT PRESENCE OF ULCERS  *URINARY PROBLEMS  *BOWEL PROBLEMS  UNUSUAL RASH Items with * indicate a potential emergency and should be followed up as soon as possible.  Feel free to call the clinic should you have any questions or concerns. The clinic phone number is [phone].  Please show the [facility] at check-in to the Emergency Department and triage nurse.

## 2018-07-31 NOTE — Progress Notes (Signed)
OK to treat with creatinine of 1.8 per Dr Marin Olp. dph

## 2018-07-31 NOTE — Patient Instructions (Signed)

## 2018-08-07 ENCOUNTER — Ambulatory Visit: Payer: PPO

## 2018-08-07 ENCOUNTER — Other Ambulatory Visit: Payer: PPO

## 2018-08-07 ENCOUNTER — Ambulatory Visit: Payer: PPO | Admitting: Hematology & Oncology

## 2018-08-08 ENCOUNTER — Ambulatory Visit: Payer: PPO

## 2018-08-11 ENCOUNTER — Telehealth: Payer: Self-pay

## 2018-08-11 NOTE — Telephone Encounter (Signed)
Called patient to discuss upcoming appointment on 08/14/18 at 8:30a.   Patient preferred to be seen in person. Deferred to Dr Debara Pickett to decide whether he could be seen on his next DOD day (Tuesday, 08/15/18) versus waiting until August.

## 2018-08-14 ENCOUNTER — Ambulatory Visit: Payer: PPO | Admitting: Internal Medicine

## 2018-08-14 ENCOUNTER — Encounter: Payer: Self-pay | Admitting: Hematology & Oncology

## 2018-08-14 ENCOUNTER — Telehealth: Payer: Self-pay | Admitting: Internal Medicine

## 2018-08-14 ENCOUNTER — Inpatient Hospital Stay: Payer: PPO

## 2018-08-14 ENCOUNTER — Inpatient Hospital Stay: Payer: PPO | Attending: Hematology & Oncology

## 2018-08-14 ENCOUNTER — Inpatient Hospital Stay (HOSPITAL_BASED_OUTPATIENT_CLINIC_OR_DEPARTMENT_OTHER): Payer: PPO | Admitting: Hematology & Oncology

## 2018-08-14 ENCOUNTER — Other Ambulatory Visit: Payer: Self-pay

## 2018-08-14 VITALS — BP 124/72 | HR 82 | Temp 98.5°F | Resp 19 | Wt 251.0 lb

## 2018-08-14 DIAGNOSIS — C772 Secondary and unspecified malignant neoplasm of intra-abdominal lymph nodes: Secondary | ICD-10-CM | POA: Diagnosis not present

## 2018-08-14 DIAGNOSIS — G629 Polyneuropathy, unspecified: Secondary | ICD-10-CM

## 2018-08-14 DIAGNOSIS — K5780 Diverticulitis of intestine, part unspecified, with perforation and abscess without bleeding: Secondary | ICD-10-CM | POA: Diagnosis not present

## 2018-08-14 DIAGNOSIS — Z79899 Other long term (current) drug therapy: Secondary | ICD-10-CM

## 2018-08-14 DIAGNOSIS — Z5111 Encounter for antineoplastic chemotherapy: Secondary | ICD-10-CM

## 2018-08-14 DIAGNOSIS — C679 Malignant neoplasm of bladder, unspecified: Secondary | ICD-10-CM | POA: Diagnosis not present

## 2018-08-14 LAB — CBC WITH DIFFERENTIAL (CANCER CENTER ONLY)
Abs Immature Granulocytes: 0.02 10*3/uL (ref 0.00–0.07)
Basophils Absolute: 0 10*3/uL (ref 0.0–0.1)
Basophils Relative: 0 %
Eosinophils Absolute: 0 10*3/uL (ref 0.0–0.5)
Eosinophils Relative: 0 %
HCT: 25.9 % — ABNORMAL LOW (ref 39.0–52.0)
Hemoglobin: 8.7 g/dL — ABNORMAL LOW (ref 13.0–17.0)
Immature Granulocytes: 0 %
Lymphocytes Relative: 15 %
Lymphs Abs: 0.7 10*3/uL (ref 0.7–4.0)
MCH: 31.8 pg (ref 26.0–34.0)
MCHC: 33.6 g/dL (ref 30.0–36.0)
MCV: 94.5 fL (ref 80.0–100.0)
Monocytes Absolute: 0.6 10*3/uL (ref 0.1–1.0)
Monocytes Relative: 13 %
Neutro Abs: 3.3 10*3/uL (ref 1.7–7.7)
Neutrophils Relative %: 72 %
Platelet Count: 85 10*3/uL — ABNORMAL LOW (ref 150–400)
RBC: 2.74 MIL/uL — ABNORMAL LOW (ref 4.22–5.81)
RDW: 15.3 % (ref 11.5–15.5)
WBC Count: 4.6 10*3/uL (ref 4.0–10.5)
nRBC: 0 % (ref 0.0–0.2)

## 2018-08-14 LAB — CMP (CANCER CENTER ONLY)
ALT: 22 U/L (ref 0–44)
AST: 25 U/L (ref 15–41)
Albumin: 3.4 g/dL — ABNORMAL LOW (ref 3.5–5.0)
Alkaline Phosphatase: 39 U/L (ref 38–126)
Anion gap: 10 (ref 5–15)
BUN: 21 mg/dL (ref 8–23)
CO2: 27 mmol/L (ref 22–32)
Calcium: 8.8 mg/dL — ABNORMAL LOW (ref 8.9–10.3)
Chloride: 100 mmol/L (ref 98–111)
Creatinine: 1.76 mg/dL — ABNORMAL HIGH (ref 0.61–1.24)
GFR, Est AFR Am: 45 mL/min — ABNORMAL LOW (ref >60)
GFR, Estimated: 39 mL/min — ABNORMAL LOW (ref >60)
Glucose, Bld: 179 mg/dL — ABNORMAL HIGH (ref 70–99)
Potassium: 3.4 mmol/L — ABNORMAL LOW (ref 3.5–5.1)
Sodium: 137 mmol/L (ref 135–145)
Total Bilirubin: 0.6 mg/dL (ref 0.3–1.2)
Total Protein: 5.7 g/dL — ABNORMAL LOW (ref 6.5–8.1)

## 2018-08-14 MED ORDER — GABAPENTIN 400 MG PO CAPS: 400.0000 mg | ORAL_CAPSULE | Freq: Four times a day (QID) | ORAL | 4 refills | Status: DC

## 2018-08-14 MED ORDER — DEXAMETHASONE SODIUM PHOSPHATE 10 MG/ML IJ SOLN
10.0000 mg | Freq: Once | INTRAMUSCULAR | Status: AC
  Administered 2018-08-14: 10 mg via INTRAVENOUS

## 2018-08-14 MED ORDER — SODIUM CHLORIDE 0.9% FLUSH
10.0000 mL | INTRAVENOUS | Status: DC | PRN
  Administered 2018-08-14: 10 mL
  Filled 2018-08-14: qty 10

## 2018-08-14 MED ORDER — SODIUM CHLORIDE 0.9 % IV SOLN
100.0000 mg | Freq: Once | INTRAVENOUS | Status: AC
  Administered 2018-08-14: 100 mg via INTRAVENOUS
  Filled 2018-08-14: qty 9

## 2018-08-14 MED ORDER — PALONOSETRON HCL INJECTION 0.25 MG/5ML
0.2500 mg | Freq: Once | INTRAVENOUS | Status: AC
  Administered 2018-08-14: 0.25 mg via INTRAVENOUS

## 2018-08-14 MED ORDER — HEPARIN SOD (PORK) LOCK FLUSH 100 UNIT/ML IV SOLN
500.0000 [IU] | Freq: Once | INTRAVENOUS | Status: AC | PRN
  Administered 2018-08-14: 500 [IU]
  Filled 2018-08-14: qty 5

## 2018-08-14 MED ORDER — DEXAMETHASONE SODIUM PHOSPHATE 10 MG/ML IJ SOLN
INTRAMUSCULAR | Status: AC
  Filled 2018-08-14: qty 1

## 2018-08-14 MED ORDER — SODIUM CHLORIDE 0.9 % IV SOLN
Freq: Once | INTRAVENOUS | Status: AC
  Administered 2018-08-14: 13:00:00 via INTRAVENOUS
  Filled 2018-08-14: qty 250

## 2018-08-14 MED ORDER — PALONOSETRON HCL INJECTION 0.25 MG/5ML
INTRAVENOUS | Status: AC
  Filled 2018-08-14: qty 5

## 2018-08-14 NOTE — Telephone Encounter (Signed)
Pt has a credit from 2019 from [facility] being met but provider continued to collect and they have disputed with [facility]. Having a hard time getting through to billing here with staff working at home.

## 2018-08-14 NOTE — Patient Instructions (Signed)
Implanted Port Insertion, Care After  This sheet gives you information about how to care for yourself after your procedure. Your health care provider may also give you more specific instructions. If you have problems or questions, contact your health care provider.  What can I expect after the procedure?  After the procedure, it is common to have:  · Discomfort at the port insertion site.  · Bruising on the skin over the port. This should improve over 3-4 days.  Follow these instructions at home:  Port care  · After your port is placed, you will get a manufacturer's information card. The card has information about your port. Keep this card with you at all times.  · Take care of the port as told by your health care provider. Ask your health care provider if you or a family member can get training for taking care of the port at home. A home health care nurse may also take care of the port.  · Make sure to remember what type of port you have.  Incision care         · Follow instructions from your health care provider about how to take care of your port insertion site. Make sure you:  ? Wash your hands with soap and water before and after you change your bandage (dressing). If soap and water are not available, use hand sanitizer.  ? Change your dressing as told by your health care provider.  ? Leave stitches (sutures), skin glue, or adhesive strips in place. These skin closures may need to stay in place for 2 weeks or longer. If adhesive strip edges start to loosen and curl up, you may trim the loose edges. Do not remove adhesive strips completely unless your health care provider tells you to do that.  · Check your port insertion site every day for signs of infection. Check for:  ? Redness, swelling, or pain.  ? Fluid or blood.  ? Warmth.  ? Pus or a bad smell.  Activity  · Return to your normal activities as told by your health care provider. Ask your health care provider what activities are safe for you.  · Do not  lift anything that is heavier than 10 lb (4.5 kg), or the limit that you are told, until your health care provider says that it is safe.  General instructions  · Take over-the-counter and prescription medicines only as told by your health care provider.  · Do not take baths, swim, or use a hot tub until your health care provider approves. Ask your health care provider if you may take showers. You may only be allowed to take sponge baths.  · Do not drive for 24 hours if you were given a sedative during your procedure.  · Wear a medical alert bracelet in case of an emergency. This will tell any health care providers that you have a port.  · Keep all follow-up visits as told by your health care provider. This is important.  Contact a health care provider if:  · You cannot flush your port with saline as directed, or you cannot draw blood from the port.  · You have a fever or chills.  · You have redness, swelling, or pain around your port insertion site.  · You have fluid or blood coming from your port insertion site.  · Your port insertion site feels warm to the touch.  · You have pus or a bad smell coming from the port   insertion site.  Get help right away if:  · You have chest pain or shortness of breath.  · You have bleeding from your port that you cannot control.  Summary  · Take care of the port as told by your health care provider. Keep the manufacturer's information card with you at all times.  · Change your dressing as told by your health care provider.  · Contact a health care provider if you have a fever or chills or if you have redness, swelling, or pain around your port insertion site.  · Keep all follow-up visits as told by your health care provider.  This information is not intended to replace advice given to you by your health care provider. Make sure you discuss any questions you have with your health care provider.  Document Released: 01/17/2013 Document Revised: 10/25/2017 Document Reviewed:  10/25/2017  Elsevier Interactive Patient Education © 2019 [employer]

## 2018-08-14 NOTE — Telephone Encounter (Signed)
Does he prefer to see me rather than Ganji (who saw him in the hospital) - if not urgent, can wait 3 months. If so, can add to DOD day.  Dr. Lemmie Evens

## 2018-08-14 NOTE — Progress Notes (Signed)
Hematology and Oncology Follow Up Visit  [name] Rose [id number] 1950/05/01 67 y.o. 08/14/2018   Principle Diagnosis:  Metastatic high-grade bladder cancer - recurrent Diverticular abscess - E.coli  Past Therapy: Atezolizumab 1200mg  IV q 3 wks - s/p cycle #4 - d/c due to progression. Taxotere 80mg /m2 IV q 3 wks - s/p cycle #3 - d/c due to progression  Current Therapy:   M-VAC - s/p cycle #8  -- d/c on 07/10/2018 due to blood counts Padcev -- s/p cycle #1 on 07/17/2018   Interim History: [name] Rose is here today for follow-up.  So far, his main problem has been some neuropathy.  He has a little bit of neuropathy in his feet and legs.  This seems to bother him mostly at nighttime.  I will have to increase his Neurontin up to 400 mg p.o. 4 times daily.  I think this will be reasonable.  Hopefully this will help with the neuropathy.  He is also using vitamin B6.  He has had no nausea or vomiting.  There is been no diarrhea.  He has had no fever.  He has had no cough.  He has had no mouth sores.  Overall, his performance status is ECOG 1.  Medications:  Allergies as of 08/14/2018   No Known Allergies     Medication List       Accurate as of Aug 14, 2018 11:44 AM. Always use your most recent med list.        Cinnamon 500 MG Tabs Take 1 tablet by mouth 2 (two) times daily.   dexamethasone 4 MG tablet Commonly known as:  DECADRON Take 2 tablets (8 mg total) by mouth daily. Start the day after chemotherapy for 2 days.   diltiazem 240 MG 24 hr capsule Commonly known as:  CARDIZEM CD Take 1 capsule (240 mg total) by mouth daily.   gabapentin 100 MG capsule Commonly known as:  NEURONTIN Take 3 capsules (300 mg total) by mouth 2 (two) times daily.   lidocaine-prilocaine cream Commonly known as:  EMLA Apply to affected area once   LORazepam 0.5 MG tablet Commonly known as:  Ativan Take 1 tablet (0.5 mg total) by mouth every 6 (six) hours as needed (Nausea or  vomiting).   losartan 100 MG tablet Commonly known as:  COZAAR Take 100 mg by mouth daily.   MAGNESIUM PO Take 400 mg by mouth at bedtime. Leg cramps   metoprolol tartrate 25 MG tablet Commonly known as:  LOPRESSOR Take 1 tablet (25 mg total) by mouth 2 (two) times daily.   ondansetron 8 MG tablet Commonly known as:  Zofran Take 1 tablet (8 mg total) by mouth 2 (two) times daily as needed for refractory nausea / vomiting. Start on day 3 after chemo.   prochlorperazine 10 MG tablet Commonly known as:  COMPAZINE Take 1 tablet (10 mg total) by mouth every 6 (six) hours as needed (Nausea or vomiting).   senna 8.6 MG Tabs tablet Commonly known as:  SENOKOT Take 1 tablet (8.6 mg total) by mouth 2 (two) times daily.   vitamin B-6 250 MG tablet Take 1 tablet (250 mg total) by mouth daily.       Allergies: No Known Allergies  Past Medical History, Surgical history, Social history, and Family History were reviewed and updated.  Review of Systems: Review of Systems  Constitutional: Negative.   HENT: Negative.   Eyes: Negative.   Respiratory: Negative.   Cardiovascular: Negative.   Gastrointestinal: Negative.  Genitourinary: Negative.   Musculoskeletal: Negative.   Skin: Negative.   Neurological: Positive for tingling.  Endo/Heme/Allergies: Negative.   Psychiatric/Behavioral: Negative.      Physical Exam:  vitals were not taken for this visit.   Wt Readings from Last 3 Encounters:  07/24/18 251 lb (113.9 kg)  07/10/18 251 lb (113.9 kg)  06/21/18 265 lb (120.2 kg)    Physical Exam Vitals signs reviewed.  HENT:     Head: Normocephalic and atraumatic.  Eyes:     Pupils: Pupils are equal, round, and reactive to light.  Neck:     Musculoskeletal: Normal range of motion.  Cardiovascular:     Rate and Rhythm: Normal rate and regular rhythm.     Heart sounds: Normal heart sounds.  Pulmonary:     Effort: Pulmonary effort is normal.     Breath sounds: Normal  breath sounds.  Abdominal:     General: Bowel sounds are normal.     Palpations: Abdomen is soft.  Musculoskeletal: Normal range of motion.        General: No tenderness or deformity.  Lymphadenopathy:     Cervical: No cervical adenopathy.  Skin:    General: Skin is warm and dry.     Findings: No erythema or rash.  Neurological:     Mental Status: He is alert and oriented to person, place, and time.  Psychiatric:        Behavior: Behavior normal.        Thought Content: Thought content normal.        Judgment: Judgment normal.      Lab Results  Component Value Date   WBC 4.6 08/14/2018   HGB 8.7 (L) 08/14/2018   HCT 25.9 (L) 08/14/2018   MCV 94.5 08/14/2018   PLT 85 (L) 08/14/2018   Lab Results  Component Value Date   FERRITIN 2,532 (H) 07/24/2018   IRON 88 07/24/2018   TIBC 240 07/24/2018   UIBC 152 07/24/2018   IRONPCTSAT 37 07/24/2018   Lab Results  Component Value Date   RETICCTPCT 2.9 07/24/2018   RBC 2.74 (L) 08/14/2018   No results found for: KPAFRELGTCHN, LAMBDASER, KAPLAMBRATIO No results found for: IGGSERUM, IGA, IGMSERUM No results found for: [name], SPEI   Chemistry      Component Value Date/Time   NA 138 07/31/2018 0920   NA 141 02/21/2017 0902   K 3.5 07/31/2018 0920   K 4.1 02/21/2017 0902   CL 100 07/31/2018 0920   CL 98 02/21/2017 0902   CO2 27 07/31/2018 0920   CO2 31 02/21/2017 0902   BUN 36 (H) 07/31/2018 0920   BUN 17 02/21/2017 0902   CREATININE 1.80 (H) 07/31/2018 0920   CREATININE 1.2 02/21/2017 0902      Component Value Date/Time   CALCIUM 8.9 07/31/2018 0920   CALCIUM 9.2 02/21/2017 0902   ALKPHOS 43 07/31/2018 0920   ALKPHOS 65 02/21/2017 0902   AST 20 07/31/2018 0920   ALT 20 07/31/2018 0920   ALT 18 02/21/2017 0902   BILITOT 0.7 07/31/2018 0920       Impression and Plan: [name] Rose is a very pleasant 69 year old Caucasian gentleman with metastatic high  grade bladder cancer.   Because of the neuropathy, I am going to decrease his Padcev dose down to 100 mg.  Hopefully this will help with the neuropathy and he has be effective.  We will start his second cycle of treatment today.  I will plan to re-scan him after his second cycle.  Hopefully we will see that he is responding.  We have to keep in mind his quality of life as a priority.  I want to make sure that we are not going to jeopardize his quality of life with neuropathy.   Volanda Napoleon, MD 5/4/202011:44 AM

## 2018-08-14 NOTE — Progress Notes (Signed)
Ok to treat with platelets of 85 & creatinine of 1.76 per Dr Marin Olp. dph

## 2018-08-14 NOTE — Patient Instructions (Signed)
[facility] Discharge Instructions for Patients Receiving Chemotherapy  Today you received the following chemotherapy agents Padcev  To help prevent nausea and vomiting after your treatment, we encourage you to take your nausea medication    If you develop nausea and vomiting that is not controlled by your nausea medication, call the clinic.   BELOW ARE SYMPTOMS THAT SHOULD BE REPORTED IMMEDIATELY:  *FEVER GREATER THAN 100.5 F  *CHILLS WITH OR WITHOUT FEVER  NAUSEA AND VOMITING THAT IS NOT CONTROLLED WITH YOUR NAUSEA MEDICATION  *UNUSUAL SHORTNESS OF BREATH  *UNUSUAL BRUISING OR BLEEDING  TENDERNESS IN MOUTH AND THROAT WITH OR WITHOUT PRESENCE OF ULCERS  *URINARY PROBLEMS  *BOWEL PROBLEMS  UNUSUAL RASH Items with * indicate a potential emergency and should be followed up as soon as possible.  Feel free to call the clinic should you have any questions or concerns. The clinic phone number is [phone].  Please show the [place] at check-in to the Emergency Department and triage nurse.

## 2018-08-14 NOTE — Telephone Encounter (Signed)
Patient is not partial to Dr Nadyne Coombes. He prefers to remain within the [facility] system and will be seeing you tomorrow (08/15/18) at 2:30p.

## 2018-08-15 ENCOUNTER — Encounter: Payer: Self-pay | Admitting: Internal Medicine

## 2018-08-15 ENCOUNTER — Ambulatory Visit (INDEPENDENT_AMBULATORY_CARE_PROVIDER_SITE_OTHER): Payer: PPO | Admitting: Internal Medicine

## 2018-08-15 VITALS — BP 108/64 | HR 75 | Ht 74.0 in | Wt 253.0 lb

## 2018-08-15 DIAGNOSIS — Z7189 Other specified counseling: Secondary | ICD-10-CM

## 2018-08-15 DIAGNOSIS — I340 Nonrheumatic mitral (valve) insufficiency: Secondary | ICD-10-CM

## 2018-08-15 DIAGNOSIS — C772 Secondary and unspecified malignant neoplasm of intra-abdominal lymph nodes: Secondary | ICD-10-CM | POA: Diagnosis not present

## 2018-08-15 DIAGNOSIS — I480 Paroxysmal atrial fibrillation: Secondary | ICD-10-CM | POA: Diagnosis not present

## 2018-08-15 DIAGNOSIS — C679 Malignant neoplasm of bladder, unspecified: Secondary | ICD-10-CM | POA: Diagnosis not present

## 2018-08-15 NOTE — Patient Instructions (Signed)
Medication Instructions:  Continue current medications If you need a refill on your cardiac medications before your next appointment, please call your pharmacy.    Follow-Up: At [facility], you and your health needs are our priority.  As part of our continuing mission to provide you with exceptional heart care, we have created designated Provider Care Teams.  These Care Teams include your primary Cardiologist (physician) and Advanced Practice Providers (APPs -  Physician Assistants and Nurse Practitioners) who all work together to provide you with the care you need, when you need it. You will need a follow up appointment in 6 months.  Please call our office 2 months in advance to schedule this appointment.  You may see [name] or one of the following Advanced Practice Providers on your designated Care Team:

## 2018-08-17 ENCOUNTER — Encounter: Payer: Self-pay | Admitting: Internal Medicine

## 2018-08-17 DIAGNOSIS — I340 Nonrheumatic mitral (valve) insufficiency: Secondary | ICD-10-CM | POA: Insufficient documentation

## 2018-08-17 NOTE — Progress Notes (Signed)
OFFICE NOTE  Chief Complaint:  Follow-up A. fib  Primary Care Physician: Shirline Frees, MD  HPI:  [name] Rose is a 68 y.o. male with a past medial history significant for bladder cancer which is metastatic, recent admission for sepsis and MSSA bacteremia.  He presents today to establish care with me as a new patient.  Initially he was seen for A. fib with RVR by Dr. Einar Gip in the hospital.  It was recommended that he go on to metoprolol and diltiazem for rate control, but rhythm control was not pursued as he could not be anticoagulated due to anemia and thrombocytopenia (a result of chemotherapy).  During that admission he was seen by infectious diseases who recommended trans-esophageal echocardiogram given the fact that he has a chemotherapy port to rule out endocarditis.  This was negative for endocarditis and did show moderate mitral regurgitation and no left atrial appendage thrombus.  He wished to follow-up with [place] rather than Dr. Einar Gip to keep his care providers in the same system.  Today he has no complaints.  He denies any worsening shortness of breath or chest pain.  He denies any palpitations.  His EKG shows sinus rhythm.  Recent labs from a few days ago showed that he remains anemic with a hemoglobin of 8.7 and platelet count of 85,000.  PMHx:  Past Medical History:  Diagnosis Date  . Arthritis   . 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 ([place]) 09/29/2016  . Bladder tumor   . Essential hypertension 06/23/2018  . Goals of care, counseling/discussion 09/30/2016  . History of prostate cancer followed by pcp dr [name]-  per pt last PSA undetectable   dx 2008-- (Stage T1c, Gleason 3+3,  PSA 4.58, vol 99cc)  s/p  radical prostatectomy (nerve sparing bilateral)   . Hyperglycemia 06/23/2018  . Hypertension   . Lower urinary tract symptoms (LUTS)   . Mild hyperlipidemia 06/23/2018  . Pre-diabetes   . Wears glasses     Past Surgical History:  Procedure  Laterality Date  . CATARACT EXTRACTION W/ INTRAOCULAR LENS  IMPLANT, BILATERAL Bilateral 2011  . [place]  . IR FLUORO GUIDE PORT INSERTION RIGHT  10/07/2016  . IR RADIOLOGIST EVAL & MGMT  01/05/2018  . IR US GUIDE VASC ACCESS RIGHT  10/07/2016  . KNEE ARTHROSCOPY Bilateral right 2006;  left 02-15-2007  . [place];  1990;  1983  . ROBOT ASSISTED LAPAROSCOPIC RADICAL PROSTATECTOMY  06/20/2006   bilateral nerve sparing  . TEE WITHOUT CARDIOVERSION N/A 06/27/2018   Procedure: TRANSESOPHAGEAL ECHOCARDIOGRAM (TEE);  Surgeon: Nigel Mormon, MD;  Location[facility];  Service: Cardiovascular;  Laterality: N/A;  . TOTAL HIP ARTHROPLASTY Left 03/03/2015   Procedure: LEFT TOTAL HIP ARTHROPLASTY ANTERIOR APPROACH;  Surgeon: Dorna Leitz, MD;  Location: [facility];  Service: Orthopedics;  Laterality: Left;  . TOTAL KNEE ARTHROPLASTY Bilateral left 08-13-2009;  right 12-26-2009  . TRANSURETHRAL RESECTION OF BLADDER TUMOR N/A 09/20/2016   Procedure: TRANSURETHRAL RESECTION OF BLADDER TUMOR (TURBT);  Surgeon: Franchot Gallo, MD;  Location: [facility];  Service: Urology;  Laterality: N/A;    FAMHx:  Family History  Problem Relation Age of Onset  . Hypertension Mother   . Aneurysm Mother   . Emphysema Father   . Hypertension Father     SOCHx:   reports that he quit smoking about 33 years ago. His smoking use included cigarettes. He quit after 16.00 years of use. He  has never used smokeless tobacco. He reports current alcohol use. He reports that he does not use drugs.  ALLERGIES:  No Known Allergies  ROS: A comprehensive review of systems was negative.  HOME MEDS: Current Outpatient Medications on File Prior to Visit  Medication Sig Dispense Refill  . Cinnamon 500 MG TABS Take 1 tablet by mouth 2 (two) times daily.     Marland Kitchen dexamethasone (DECADRON) 4 MG tablet Take 2 tablets (8 mg total) by mouth daily. Start the day after chemotherapy for  2 days. 30 tablet 1  . diltiazem (CARDIZEM CD) 240 MG 24 hr capsule Take 1 capsule (240 mg total) by mouth daily. 30 capsule 1  . gabapentin (NEURONTIN) 400 MG capsule Take 1 capsule (400 mg total) by mouth 4 (four) times daily. 120 capsule 4  . lidocaine-prilocaine (EMLA) cream Apply to affected area once 30 g 3  . LORazepam (ATIVAN) 0.5 MG tablet Take 1 tablet (0.5 mg total) by mouth every 6 (six) hours as needed (Nausea or vomiting). 30 tablet 0  . losartan (COZAAR) 100 MG tablet Take 100 mg by mouth daily.    Marland Kitchen MAGNESIUM PO Take 400 mg by mouth at bedtime. Leg cramps    . metoprolol tartrate (LOPRESSOR) 25 MG tablet Take 1 tablet (25 mg total) by mouth 2 (two) times daily. 60 tablet 1  . ondansetron (ZOFRAN) 8 MG tablet Take 1 tablet (8 mg total) by mouth 2 (two) times daily as needed for refractory nausea / vomiting. Start on day 3 after chemo. 30 tablet 1  . prochlorperazine (COMPAZINE) 10 MG tablet Take 1 tablet (10 mg total) by mouth every 6 (six) hours as needed (Nausea or vomiting). 30 tablet 1  . Pyridoxine HCl (VITAMIN B-6) 250 MG tablet Take 1 tablet (250 mg total) by mouth daily.    Marland Kitchen senna (SENOKOT) 8.6 MG TABS tablet Take 1 tablet (8.6 mg total) by mouth 2 (two) times daily. 30 each 0   Current Facility-Administered Medications on File Prior to Visit  Medication Dose Route Frequency Provider Last Rate Last Dose  . sodium chloride flush (NS) 0.9 % injection 10 mL  10 mL Intravenous PRN [place], Holli Humbles, NP   10 mL at 02/21/17 1038  . sodium chloride flush (NS) 0.9 % injection 10 mL  10 mL Intravenous PRN Volanda Napoleon, MD   10 mL at 07/10/18 0844    LABS/IMAGING: No results found for this or any previous visit (from the past 48 hour(s)). No results found.  LIPID PANEL:    Component Value Date/Time   CHOL 163 10/11/2016 0749   TRIG 116 10/11/2016 0749   HDL 39 (L) 10/11/2016 0749   CHOLHDL 4.2 10/11/2016 0749   LDLCALC 101 (H) 10/11/2016 0749     WEIGHTS: Wt  Readings from Last 3 Encounters:  08/15/18 253 lb (114.8 kg)  08/14/18 251 lb (113.9 kg)  07/24/18 251 lb (113.9 kg)    VITALS: BP 108/64   Pulse 75   Ht $R'6\' 2"'Jt$  (1.88 m)   Wt 253 lb (114.8 kg)   BMI 32.48 kg/m   EXAM: General appearance: alert and no distress Neck: no carotid bruit, no JVD and thyroid not enlarged, symmetric, no tenderness/mass/nodules Lungs: clear to auscultation bilaterally Heart: regular rate and rhythm, S1, S2 normal and systolic murmur: early systolic 2/6, blowing at apex Abdomen: soft, non-tender; bowel sounds normal; no masses,  no organomegaly Extremities: extremities normal, atraumatic, no cyanosis or edema Pulses: 2+ and symmetric Skin:  Skin color, texture, turgor normal. No rashes or lesions Neurologic: Grossly normal Psych: Pleasant  EKG: Normal sinus rhythm with first-degree AV block, PACs at 75- personally reviewed  ASSESSMENT: 1. Paroxysmal atrial fibrillation-CHADSVASC score 3, not on anticoagulation due to anemia and thrombocytopenia 2. Metastatic bladder cancer 3. Anemia and thrombocytopenia related to chemotherapy 4. Recent MSSA sepsis without evidence of endocarditis or left atrial appendage thrombus 5. Normal LV function with moderate mitral regurgitation (06/2018)  PLAN: 1.   [name] Rose code had paroxysmal atrial fibrillation but appears to be in sinus.  He is not a candidate for long-term anticoagulation due to anemia and thrombocytopenia.  Fortunately he had no evidence of any endocarditis on TEE.  There was no evidence of left atrial appendage thrombus.  He did have some moderate mitral regurgitation but has no significant murmur on exam today.  This may have improved and could have been low dependent.  We will plan to follow-up with him in 6 months or sooner as necessary.  [name] Casino, MD, [facility], [place] Director of the Advanced Lipid Disorders &  Cardiovascular Risk Reduction Clinic Diplomate  of the American Board of Clinical Lipidology Attending Cardiologist  Direct Dial: [phone]  Fax: [phone]  Website:  [name] 08/17/2018, 10:10 AM

## 2018-08-21 ENCOUNTER — Other Ambulatory Visit: Payer: Self-pay

## 2018-08-21 ENCOUNTER — Inpatient Hospital Stay: Payer: PPO

## 2018-08-21 VITALS — HR 98

## 2018-08-21 VITALS — BP 114/72 | HR 101 | Temp 98.4°F | Resp 18

## 2018-08-21 DIAGNOSIS — C772 Secondary and unspecified malignant neoplasm of intra-abdominal lymph nodes: Secondary | ICD-10-CM

## 2018-08-21 DIAGNOSIS — Z5111 Encounter for antineoplastic chemotherapy: Secondary | ICD-10-CM | POA: Diagnosis not present

## 2018-08-21 DIAGNOSIS — C679 Malignant neoplasm of bladder, unspecified: Secondary | ICD-10-CM

## 2018-08-21 LAB — CMP (CANCER CENTER ONLY)
ALT: 39 U/L (ref 0–44)
AST: 35 U/L (ref 15–41)
Albumin: 3.5 g/dL (ref 3.5–5.0)
Alkaline Phosphatase: 45 U/L (ref 38–126)
Anion gap: 10 (ref 5–15)
BUN: 33 mg/dL — ABNORMAL HIGH (ref 8–23)
CO2: 27 mmol/L (ref 22–32)
Calcium: 9 mg/dL (ref 8.9–10.3)
Chloride: 96 mmol/L — ABNORMAL LOW (ref 98–111)
Creatinine: 1.7 mg/dL — ABNORMAL HIGH (ref 0.61–1.24)
GFR, Est AFR Am: 47 mL/min — ABNORMAL LOW (ref >60)
GFR, Estimated: 41 mL/min — ABNORMAL LOW (ref >60)
Glucose, Bld: 149 mg/dL — ABNORMAL HIGH (ref 70–99)
Potassium: 3.7 mmol/L (ref 3.5–5.1)
Sodium: 133 mmol/L — ABNORMAL LOW (ref 135–145)
Total Bilirubin: 0.6 mg/dL (ref 0.3–1.2)
Total Protein: 5.6 g/dL — ABNORMAL LOW (ref 6.5–8.1)

## 2018-08-21 LAB — CBC WITH DIFFERENTIAL (CANCER CENTER ONLY)
Abs Immature Granulocytes: 0.18 10*3/uL — ABNORMAL HIGH (ref 0.00–0.07)
Basophils Absolute: 0 10*3/uL (ref 0.0–0.1)
Basophils Relative: 0 %
Eosinophils Absolute: 0.1 10*3/uL (ref 0.0–0.5)
Eosinophils Relative: 1 %
HCT: 28.1 % — ABNORMAL LOW (ref 39.0–52.0)
Hemoglobin: 9.5 g/dL — ABNORMAL LOW (ref 13.0–17.0)
Immature Granulocytes: 2 %
Lymphocytes Relative: 13 %
Lymphs Abs: 1 10*3/uL (ref 0.7–4.0)
MCH: 31.6 pg (ref 26.0–34.0)
MCHC: 33.8 g/dL (ref 30.0–36.0)
MCV: 93.4 fL (ref 80.0–100.0)
Monocytes Absolute: 0.9 10*3/uL (ref 0.1–1.0)
Monocytes Relative: 11 %
Neutro Abs: 5.9 10*3/uL (ref 1.7–7.7)
Neutrophils Relative %: 73 %
Platelet Count: 89 10*3/uL — ABNORMAL LOW (ref 150–400)
RBC: 3.01 MIL/uL — ABNORMAL LOW (ref 4.22–5.81)
RDW: 15.8 % — ABNORMAL HIGH (ref 11.5–15.5)
WBC Count: 8.1 10*3/uL (ref 4.0–10.5)
nRBC: 0 % (ref 0.0–0.2)

## 2018-08-21 MED ORDER — PALONOSETRON HCL INJECTION 0.25 MG/5ML
0.2500 mg | Freq: Once | INTRAVENOUS | Status: AC
  Administered 2018-08-21: 0.25 mg via INTRAVENOUS

## 2018-08-21 MED ORDER — HEPARIN SOD (PORK) LOCK FLUSH 100 UNIT/ML IV SOLN
500.0000 [IU] | Freq: Once | INTRAVENOUS | Status: AC | PRN
  Administered 2018-08-21: 500 [IU]
  Filled 2018-08-21: qty 5

## 2018-08-21 MED ORDER — SODIUM CHLORIDE 0.9% FLUSH
10.0000 mL | Freq: Once | INTRAVENOUS | Status: AC
  Administered 2018-08-21: 10 mL
  Filled 2018-08-21: qty 10

## 2018-08-21 MED ORDER — SODIUM CHLORIDE 0.9 % IV SOLN
Freq: Once | INTRAVENOUS | Status: AC
  Administered 2018-08-21: 12:00:00 via INTRAVENOUS
  Filled 2018-08-21: qty 250

## 2018-08-21 MED ORDER — SODIUM CHLORIDE 0.9% FLUSH
10.0000 mL | INTRAVENOUS | Status: DC | PRN
  Administered 2018-08-21: 10 mL
  Filled 2018-08-21: qty 10

## 2018-08-21 MED ORDER — SODIUM CHLORIDE 0.9 % IV SOLN
100.0000 mg | Freq: Once | INTRAVENOUS | Status: AC
  Administered 2018-08-21: 100 mg via INTRAVENOUS
  Filled 2018-08-21: qty 10

## 2018-08-21 MED ORDER — PALONOSETRON HCL INJECTION 0.25 MG/5ML
INTRAVENOUS | Status: AC
  Filled 2018-08-21: qty 5

## 2018-08-21 MED ORDER — DEXAMETHASONE SODIUM PHOSPHATE 10 MG/ML IJ SOLN
INTRAMUSCULAR | Status: AC
  Filled 2018-08-21: qty 1

## 2018-08-21 MED ORDER — DEXAMETHASONE SODIUM PHOSPHATE 10 MG/ML IJ SOLN
10.0000 mg | Freq: Once | INTRAMUSCULAR | Status: AC
  Administered 2018-08-21: 10 mg via INTRAVENOUS

## 2018-08-21 NOTE — Patient Instructions (Signed)
[place] [facility] Discharge Instructions for Patients Receiving Chemotherapy  Today you received the following chemotherapy agents Padcev  To help prevent nausea and vomiting after your treatment, we encourage you to take your nausea medication as prescribed If you develop nausea and vomiting that is not controlled by your nausea medication, call the clinic.   BELOW ARE SYMPTOMS THAT SHOULD BE REPORTED IMMEDIATELY:  *FEVER GREATER THAN 100.5 F  *CHILLS WITH OR WITHOUT FEVER  NAUSEA AND VOMITING THAT IS NOT CONTROLLED WITH YOUR NAUSEA MEDICATION  *UNUSUAL SHORTNESS OF BREATH  *UNUSUAL BRUISING OR BLEEDING  TENDERNESS IN MOUTH AND THROAT WITH OR WITHOUT PRESENCE OF ULCERS  *URINARY PROBLEMS  *BOWEL PROBLEMS  UNUSUAL RASH Items with * indicate a potential emergency and should be followed up as soon as possible.  Feel free to call the clinic should you have any questions or concerns. The clinic phone number is [phone].  Please show the [place] at check-in to the Emergency Department and triage nurse.

## 2018-08-21 NOTE — Progress Notes (Signed)
Ok to treat with platelets 89 and creatinine 1.7 per [name].

## 2018-08-21 NOTE — Patient Instructions (Signed)

## 2018-08-28 ENCOUNTER — Other Ambulatory Visit: Payer: Self-pay

## 2018-08-28 ENCOUNTER — Inpatient Hospital Stay: Payer: PPO

## 2018-08-28 VITALS — BP 102/63 | HR 88 | Temp 99.0°F | Resp 18

## 2018-08-28 DIAGNOSIS — C679 Malignant neoplasm of bladder, unspecified: Secondary | ICD-10-CM

## 2018-08-28 DIAGNOSIS — C772 Secondary and unspecified malignant neoplasm of intra-abdominal lymph nodes: Secondary | ICD-10-CM

## 2018-08-28 DIAGNOSIS — Z5111 Encounter for antineoplastic chemotherapy: Secondary | ICD-10-CM | POA: Diagnosis not present

## 2018-08-28 LAB — CBC WITH DIFFERENTIAL (CANCER CENTER ONLY)
Abs Immature Granulocytes: 0.13 10*3/uL — ABNORMAL HIGH (ref 0.00–0.07)
Basophils Absolute: 0 10*3/uL (ref 0.0–0.1)
Basophils Relative: 0 %
Eosinophils Absolute: 0.1 10*3/uL (ref 0.0–0.5)
Eosinophils Relative: 1 %
HCT: 29.9 % — ABNORMAL LOW (ref 39.0–52.0)
Hemoglobin: 10 g/dL — ABNORMAL LOW (ref 13.0–17.0)
Immature Granulocytes: 1 %
Lymphocytes Relative: 11 %
Lymphs Abs: 1.3 10*3/uL (ref 0.7–4.0)
MCH: 31.3 pg (ref 26.0–34.0)
MCHC: 33.4 g/dL (ref 30.0–36.0)
MCV: 93.7 fL (ref 80.0–100.0)
Monocytes Absolute: 1.2 10*3/uL — ABNORMAL HIGH (ref 0.1–1.0)
Monocytes Relative: 10 %
Neutro Abs: 8.9 10*3/uL — ABNORMAL HIGH (ref 1.7–7.7)
Neutrophils Relative %: 77 %
Platelet Count: 105 10*3/uL — ABNORMAL LOW (ref 150–400)
RBC: 3.19 MIL/uL — ABNORMAL LOW (ref 4.22–5.81)
RDW: 15.9 % — ABNORMAL HIGH (ref 11.5–15.5)
WBC Count: 11.7 10*3/uL — ABNORMAL HIGH (ref 4.0–10.5)
nRBC: 0 % (ref 0.0–0.2)

## 2018-08-28 LAB — CMP (CANCER CENTER ONLY)
ALT: 40 U/L (ref 0–44)
AST: 32 U/L (ref 15–41)
Albumin: 3.5 g/dL (ref 3.5–5.0)
Alkaline Phosphatase: 39 U/L (ref 38–126)
Anion gap: 12 (ref 5–15)
BUN: 37 mg/dL — ABNORMAL HIGH (ref 8–23)
CO2: 26 mmol/L (ref 22–32)
Calcium: 8.3 mg/dL — ABNORMAL LOW (ref 8.9–10.3)
Chloride: 93 mmol/L — ABNORMAL LOW (ref 98–111)
Creatinine: 2.01 mg/dL — ABNORMAL HIGH (ref 0.61–1.24)
GFR, Est AFR Am: 39 mL/min — ABNORMAL LOW (ref >60)
GFR, Estimated: 33 mL/min — ABNORMAL LOW (ref >60)
Glucose, Bld: 144 mg/dL — ABNORMAL HIGH (ref 70–99)
Potassium: 3.8 mmol/L (ref 3.5–5.1)
Sodium: 131 mmol/L — ABNORMAL LOW (ref 135–145)
Total Bilirubin: 0.9 mg/dL (ref 0.3–1.2)
Total Protein: 5.6 g/dL — ABNORMAL LOW (ref 6.5–8.1)

## 2018-08-28 MED ORDER — HEPARIN SOD (PORK) LOCK FLUSH 100 UNIT/ML IV SOLN
500.0000 [IU] | Freq: Once | INTRAVENOUS | Status: AC | PRN
  Administered 2018-08-28: 500 [IU]
  Filled 2018-08-28: qty 5

## 2018-08-28 MED ORDER — DEXAMETHASONE SODIUM PHOSPHATE 10 MG/ML IJ SOLN
10.0000 mg | Freq: Once | INTRAMUSCULAR | Status: AC
  Administered 2018-08-28: 10 mg via INTRAVENOUS

## 2018-08-28 MED ORDER — PALONOSETRON HCL INJECTION 0.25 MG/5ML
0.2500 mg | Freq: Once | INTRAVENOUS | Status: AC
  Administered 2018-08-28: 0.25 mg via INTRAVENOUS

## 2018-08-28 MED ORDER — DEXAMETHASONE SODIUM PHOSPHATE 10 MG/ML IJ SOLN
INTRAMUSCULAR | Status: AC
  Filled 2018-08-28: qty 1

## 2018-08-28 MED ORDER — SODIUM CHLORIDE 0.9 % IV SOLN
100.0000 mg | Freq: Once | INTRAVENOUS | Status: AC
  Administered 2018-08-28: 100 mg via INTRAVENOUS
  Filled 2018-08-28: qty 10

## 2018-08-28 MED ORDER — PALONOSETRON HCL INJECTION 0.25 MG/5ML
INTRAVENOUS | Status: AC
  Filled 2018-08-28: qty 5

## 2018-08-28 MED ORDER — SODIUM CHLORIDE 0.9% FLUSH
10.0000 mL | INTRAVENOUS | Status: DC | PRN
  Administered 2018-08-28: 10 mL
  Filled 2018-08-28: qty 10

## 2018-08-28 NOTE — Progress Notes (Signed)
Reviewed labwork with Dr. Maylon Peppers.  Ok to treat today.

## 2018-08-28 NOTE — Patient Instructions (Signed)

## 2018-08-28 NOTE — Patient Instructions (Signed)
[place] Discharge Instructions for Patients Receiving Chemotherapy  Today you received the following chemotherapy agents Padcev  To help prevent nausea and vomiting after your treatment, we encourage you to take your nausea medication    If you develop nausea and vomiting that is not controlled by your nausea medication, call the clinic.   BELOW ARE SYMPTOMS THAT SHOULD BE REPORTED IMMEDIATELY:  *FEVER GREATER THAN 100.5 F  *CHILLS WITH OR WITHOUT FEVER  NAUSEA AND VOMITING THAT IS NOT CONTROLLED WITH YOUR NAUSEA MEDICATION  *UNUSUAL SHORTNESS OF BREATH  *UNUSUAL BRUISING OR BLEEDING  TENDERNESS IN MOUTH AND THROAT WITH OR WITHOUT PRESENCE OF ULCERS  *URINARY PROBLEMS  *BOWEL PROBLEMS  UNUSUAL RASH Items with * indicate a potential emergency and should be followed up as soon as possible.  Feel free to call the clinic should you have any questions or concerns. The clinic phone number is [phone].  Please show the [place] at check-in to the Emergency Department and triage nurse.

## 2018-08-30 ENCOUNTER — Other Ambulatory Visit: Payer: Self-pay | Admitting: Hematology & Oncology

## 2018-08-30 DIAGNOSIS — C772 Secondary and unspecified malignant neoplasm of intra-abdominal lymph nodes: Secondary | ICD-10-CM

## 2018-08-30 DIAGNOSIS — C679 Malignant neoplasm of bladder, unspecified: Secondary | ICD-10-CM

## 2018-09-05 ENCOUNTER — Other Ambulatory Visit: Payer: Self-pay

## 2018-09-05 ENCOUNTER — Other Ambulatory Visit: Payer: Self-pay | Admitting: Hematology & Oncology

## 2018-09-05 ENCOUNTER — Ambulatory Visit (HOSPITAL_COMMUNITY)
Admission: RE | Admit: 2018-09-05 | Discharge: 2018-09-05 | Disposition: A | Discharge status: Home / Self Care | Payer: PPO | Source: Ambulatory Visit | Attending: Hematology & Oncology | Admitting: Hematology & Oncology

## 2018-09-05 DIAGNOSIS — C679 Malignant neoplasm of bladder, unspecified: Secondary | ICD-10-CM

## 2018-09-05 DIAGNOSIS — K5730 Diverticulosis of large intestine without perforation or abscess without bleeding: Secondary | ICD-10-CM | POA: Diagnosis not present

## 2018-09-05 DIAGNOSIS — C772 Secondary and unspecified malignant neoplasm of intra-abdominal lymph nodes: Secondary | ICD-10-CM | POA: Insufficient documentation

## 2018-09-05 DIAGNOSIS — I700 Atherosclerosis of aorta: Secondary | ICD-10-CM | POA: Diagnosis not present

## 2018-09-05 DIAGNOSIS — J439 Emphysema, unspecified: Secondary | ICD-10-CM | POA: Diagnosis not present

## 2018-09-05 DIAGNOSIS — C7911 Secondary malignant neoplasm of bladder: Secondary | ICD-10-CM | POA: Diagnosis not present

## 2018-09-05 LAB — GLUCOSE, CAPILLARY
Glucose-Capillary: 171 mg/dL — ABNORMAL HIGH (ref 70–99)
Glucose-Capillary: 171 mg/dL — ABNORMAL HIGH (ref 70–99)

## 2018-09-05 MED ORDER — FLUDEOXYGLUCOSE F - 18 (FDG) INJECTION
12.6000 | Freq: Once | INTRAVENOUS | Status: AC | PRN
  Administered 2018-09-05: 12.6 via INTRAVENOUS

## 2018-09-06 ENCOUNTER — Telehealth: Payer: Self-pay | Admitting: *Deleted

## 2018-09-06 NOTE — Telephone Encounter (Signed)
-----   Message from [name] Napoleon, MD sent at 09/06/2018  9:14 AM EDT ----- Call - the PET scan shows resolution of his cancer!!  He is now in remission.  This is wonderful!!!  [name]

## 2018-09-06 NOTE — Telephone Encounter (Signed)
Unable to reach pt, # busy. Will try again at later time.

## 2018-09-08 ENCOUNTER — Other Ambulatory Visit: Payer: Self-pay

## 2018-09-08 ENCOUNTER — Inpatient Hospital Stay (HOSPITAL_COMMUNITY)
Admission: EM | Admit: 2018-09-08 | Discharge: 2018-10-14 | DRG: 870 | Disposition: A | Discharge status: Skilled Nursing Facility | Payer: PPO | Attending: Internal Medicine | Admitting: Internal Medicine

## 2018-09-08 ENCOUNTER — Emergency Department (HOSPITAL_COMMUNITY): Payer: PPO

## 2018-09-08 ENCOUNTER — Encounter (HOSPITAL_COMMUNITY): Payer: Self-pay | Admitting: Emergency Medicine

## 2018-09-08 DIAGNOSIS — I129 Hypertensive chronic kidney disease with stage 1 through stage 4 chronic kidney disease, or unspecified chronic kidney disease: Secondary | ICD-10-CM | POA: Diagnosis present

## 2018-09-08 DIAGNOSIS — E11649 Type 2 diabetes mellitus with hypoglycemia without coma: Secondary | ICD-10-CM | POA: Diagnosis not present

## 2018-09-08 DIAGNOSIS — C679 Malignant neoplasm of bladder, unspecified: Secondary | ICD-10-CM

## 2018-09-08 DIAGNOSIS — E213 Hyperparathyroidism, unspecified: Secondary | ICD-10-CM | POA: Diagnosis not present

## 2018-09-08 DIAGNOSIS — R945 Abnormal results of liver function studies: Secondary | ICD-10-CM | POA: Diagnosis not present

## 2018-09-08 DIAGNOSIS — N171 Acute kidney failure with acute cortical necrosis: Secondary | ICD-10-CM | POA: Diagnosis not present

## 2018-09-08 DIAGNOSIS — E1165 Type 2 diabetes mellitus with hyperglycemia: Secondary | ICD-10-CM | POA: Diagnosis not present

## 2018-09-08 DIAGNOSIS — Z87891 Personal history of nicotine dependence: Secondary | ICD-10-CM | POA: Diagnosis not present

## 2018-09-08 DIAGNOSIS — E876 Hypokalemia: Secondary | ICD-10-CM | POA: Diagnosis present

## 2018-09-08 DIAGNOSIS — Z7401 Bed confinement status: Secondary | ICD-10-CM | POA: Diagnosis not present

## 2018-09-08 DIAGNOSIS — Z95828 Presence of other vascular implants and grafts: Secondary | ICD-10-CM | POA: Diagnosis not present

## 2018-09-08 DIAGNOSIS — R509 Fever, unspecified: Secondary | ICD-10-CM | POA: Diagnosis not present

## 2018-09-08 DIAGNOSIS — R188 Other ascites: Secondary | ICD-10-CM | POA: Diagnosis not present

## 2018-09-08 DIAGNOSIS — E874 Mixed disorder of acid-base balance: Secondary | ICD-10-CM | POA: Diagnosis not present

## 2018-09-08 DIAGNOSIS — R0902 Hypoxemia: Secondary | ICD-10-CM | POA: Diagnosis not present

## 2018-09-08 DIAGNOSIS — E119 Type 2 diabetes mellitus without complications: Secondary | ICD-10-CM | POA: Diagnosis not present

## 2018-09-08 DIAGNOSIS — R0602 Shortness of breath: Secondary | ICD-10-CM | POA: Diagnosis not present

## 2018-09-08 DIAGNOSIS — N183 Chronic kidney disease, stage 3 unspecified: Secondary | ICD-10-CM | POA: Diagnosis present

## 2018-09-08 DIAGNOSIS — E8770 Fluid overload, unspecified: Secondary | ICD-10-CM | POA: Diagnosis not present

## 2018-09-08 DIAGNOSIS — J181 Lobar pneumonia, unspecified organism: Secondary | ICD-10-CM | POA: Diagnosis not present

## 2018-09-08 DIAGNOSIS — R451 Restlessness and agitation: Secondary | ICD-10-CM | POA: Diagnosis not present

## 2018-09-08 DIAGNOSIS — A4101 Sepsis due to Methicillin susceptible Staphylococcus aureus: Principal | ICD-10-CM | POA: Diagnosis present

## 2018-09-08 DIAGNOSIS — E875 Hyperkalemia: Secondary | ICD-10-CM | POA: Diagnosis not present

## 2018-09-08 DIAGNOSIS — M6281 Muscle weakness (generalized): Secondary | ICD-10-CM | POA: Diagnosis not present

## 2018-09-08 DIAGNOSIS — R1310 Dysphagia, unspecified: Secondary | ICD-10-CM | POA: Diagnosis not present

## 2018-09-08 DIAGNOSIS — R29898 Other symptoms and signs involving the musculoskeletal system: Secondary | ICD-10-CM | POA: Diagnosis not present

## 2018-09-08 DIAGNOSIS — I480 Paroxysmal atrial fibrillation: Secondary | ICD-10-CM | POA: Diagnosis not present

## 2018-09-08 DIAGNOSIS — R001 Bradycardia, unspecified: Secondary | ICD-10-CM | POA: Diagnosis not present

## 2018-09-08 DIAGNOSIS — J9602 Acute respiratory failure with hypercapnia: Secondary | ICD-10-CM | POA: Diagnosis not present

## 2018-09-08 DIAGNOSIS — I4811 Longstanding persistent atrial fibrillation: Secondary | ICD-10-CM | POA: Diagnosis not present

## 2018-09-08 DIAGNOSIS — C61 Malignant neoplasm of prostate: Secondary | ICD-10-CM | POA: Diagnosis not present

## 2018-09-08 DIAGNOSIS — G9341 Metabolic encephalopathy: Secondary | ICD-10-CM | POA: Diagnosis not present

## 2018-09-08 DIAGNOSIS — Z8619 Personal history of other infectious and parasitic diseases: Secondary | ICD-10-CM | POA: Diagnosis not present

## 2018-09-08 DIAGNOSIS — C799 Secondary malignant neoplasm of unspecified site: Secondary | ICD-10-CM | POA: Diagnosis present

## 2018-09-08 DIAGNOSIS — D6949 Other primary thrombocytopenia: Secondary | ICD-10-CM | POA: Diagnosis not present

## 2018-09-08 DIAGNOSIS — Z4682 Encounter for fitting and adjustment of non-vascular catheter: Secondary | ICD-10-CM | POA: Diagnosis not present

## 2018-09-08 DIAGNOSIS — Z452 Encounter for adjustment and management of vascular access device: Secondary | ICD-10-CM

## 2018-09-08 DIAGNOSIS — I2699 Other pulmonary embolism without acute cor pulmonale: Secondary | ICD-10-CM | POA: Diagnosis present

## 2018-09-08 DIAGNOSIS — C7911 Secondary malignant neoplasm of bladder: Secondary | ICD-10-CM | POA: Diagnosis not present

## 2018-09-08 DIAGNOSIS — Z8546 Personal history of malignant neoplasm of prostate: Secondary | ICD-10-CM

## 2018-09-08 DIAGNOSIS — Z825 Family history of asthma and other chronic lower respiratory diseases: Secondary | ICD-10-CM

## 2018-09-08 DIAGNOSIS — R4702 Dysphasia: Secondary | ICD-10-CM | POA: Diagnosis not present

## 2018-09-08 DIAGNOSIS — Z79899 Other long term (current) drug therapy: Secondary | ICD-10-CM

## 2018-09-08 DIAGNOSIS — Z96653 Presence of artificial knee joint, bilateral: Secondary | ICD-10-CM | POA: Diagnosis present

## 2018-09-08 DIAGNOSIS — R531 Weakness: Secondary | ICD-10-CM | POA: Diagnosis not present

## 2018-09-08 DIAGNOSIS — T380X5A Adverse effect of glucocorticoids and synthetic analogues, initial encounter: Secondary | ICD-10-CM | POA: Diagnosis not present

## 2018-09-08 DIAGNOSIS — Z4659 Encounter for fitting and adjustment of other gastrointestinal appliance and device: Secondary | ICD-10-CM | POA: Diagnosis not present

## 2018-09-08 DIAGNOSIS — Z992 Dependence on renal dialysis: Secondary | ICD-10-CM | POA: Diagnosis not present

## 2018-09-08 DIAGNOSIS — I482 Chronic atrial fibrillation, unspecified: Secondary | ICD-10-CM | POA: Diagnosis present

## 2018-09-08 DIAGNOSIS — Z9911 Dependence on respirator [ventilator] status: Secondary | ICD-10-CM | POA: Diagnosis not present

## 2018-09-08 DIAGNOSIS — Z515 Encounter for palliative care: Secondary | ICD-10-CM | POA: Diagnosis not present

## 2018-09-08 DIAGNOSIS — K760 Fatty (change of) liver, not elsewhere classified: Secondary | ICD-10-CM | POA: Diagnosis present

## 2018-09-08 DIAGNOSIS — Z96642 Presence of left artificial hip joint: Secondary | ICD-10-CM | POA: Diagnosis present

## 2018-09-08 DIAGNOSIS — I959 Hypotension, unspecified: Secondary | ICD-10-CM | POA: Diagnosis not present

## 2018-09-08 DIAGNOSIS — N170 Acute kidney failure with tubular necrosis: Secondary | ICD-10-CM | POA: Diagnosis present

## 2018-09-08 DIAGNOSIS — J9600 Acute respiratory failure, unspecified whether with hypoxia or hypercapnia: Secondary | ICD-10-CM | POA: Diagnosis not present

## 2018-09-08 DIAGNOSIS — N19 Unspecified kidney failure: Secondary | ICD-10-CM | POA: Diagnosis not present

## 2018-09-08 DIAGNOSIS — Z978 Presence of other specified devices: Secondary | ICD-10-CM

## 2018-09-08 DIAGNOSIS — J811 Chronic pulmonary edema: Secondary | ICD-10-CM | POA: Diagnosis not present

## 2018-09-08 DIAGNOSIS — I4891 Unspecified atrial fibrillation: Secondary | ICD-10-CM | POA: Diagnosis not present

## 2018-09-08 DIAGNOSIS — C801 Malignant (primary) neoplasm, unspecified: Secondary | ICD-10-CM | POA: Diagnosis not present

## 2018-09-08 DIAGNOSIS — L89152 Pressure ulcer of sacral region, stage 2: Secondary | ICD-10-CM | POA: Diagnosis not present

## 2018-09-08 DIAGNOSIS — Z961 Presence of intraocular lens: Secondary | ICD-10-CM | POA: Diagnosis present

## 2018-09-08 DIAGNOSIS — T8249XA Other complication of vascular dialysis catheter, initial encounter: Secondary | ICD-10-CM | POA: Diagnosis not present

## 2018-09-08 DIAGNOSIS — D630 Anemia in neoplastic disease: Secondary | ICD-10-CM | POA: Diagnosis present

## 2018-09-08 DIAGNOSIS — C772 Secondary and unspecified malignant neoplasm of intra-abdominal lymph nodes: Secondary | ICD-10-CM | POA: Diagnosis present

## 2018-09-08 DIAGNOSIS — R7881 Bacteremia: Secondary | ICD-10-CM | POA: Diagnosis not present

## 2018-09-08 DIAGNOSIS — J9601 Acute respiratory failure with hypoxia: Secondary | ICD-10-CM | POA: Diagnosis not present

## 2018-09-08 DIAGNOSIS — D631 Anemia in chronic kidney disease: Secondary | ICD-10-CM | POA: Diagnosis not present

## 2018-09-08 DIAGNOSIS — D729 Disorder of white blood cells, unspecified: Secondary | ICD-10-CM | POA: Diagnosis not present

## 2018-09-08 DIAGNOSIS — D539 Nutritional anemia, unspecified: Secondary | ICD-10-CM | POA: Diagnosis not present

## 2018-09-08 DIAGNOSIS — R34 Anuria and oliguria: Secondary | ICD-10-CM | POA: Diagnosis not present

## 2018-09-08 DIAGNOSIS — J9690 Respiratory failure, unspecified, unspecified whether with hypoxia or hypercapnia: Secondary | ICD-10-CM | POA: Diagnosis not present

## 2018-09-08 DIAGNOSIS — T8241XA Breakdown (mechanical) of vascular dialysis catheter, initial encounter: Secondary | ICD-10-CM

## 2018-09-08 DIAGNOSIS — Z6832 Body mass index (BMI) 32.0-32.9, adult: Secondary | ICD-10-CM

## 2018-09-08 DIAGNOSIS — M2550 Pain in unspecified joint: Secondary | ICD-10-CM | POA: Diagnosis not present

## 2018-09-08 DIAGNOSIS — B59 Pneumocystosis: Secondary | ICD-10-CM | POA: Diagnosis not present

## 2018-09-08 DIAGNOSIS — E785 Hyperlipidemia, unspecified: Secondary | ICD-10-CM | POA: Diagnosis present

## 2018-09-08 DIAGNOSIS — J189 Pneumonia, unspecified organism: Secondary | ICD-10-CM | POA: Diagnosis not present

## 2018-09-08 DIAGNOSIS — E878 Other disorders of electrolyte and fluid balance, not elsewhere classified: Secondary | ICD-10-CM | POA: Diagnosis not present

## 2018-09-08 DIAGNOSIS — Z9842 Cataract extraction status, left eye: Secondary | ICD-10-CM

## 2018-09-08 DIAGNOSIS — I4819 Other persistent atrial fibrillation: Secondary | ICD-10-CM | POA: Diagnosis present

## 2018-09-08 DIAGNOSIS — I10 Essential (primary) hypertension: Secondary | ICD-10-CM | POA: Diagnosis not present

## 2018-09-08 DIAGNOSIS — D696 Thrombocytopenia, unspecified: Secondary | ICD-10-CM | POA: Diagnosis not present

## 2018-09-08 DIAGNOSIS — D709 Neutropenia, unspecified: Secondary | ICD-10-CM | POA: Diagnosis present

## 2018-09-08 DIAGNOSIS — R918 Other nonspecific abnormal finding of lung field: Secondary | ICD-10-CM | POA: Diagnosis not present

## 2018-09-08 DIAGNOSIS — E1142 Type 2 diabetes mellitus with diabetic polyneuropathy: Secondary | ICD-10-CM | POA: Diagnosis present

## 2018-09-08 DIAGNOSIS — E669 Obesity, unspecified: Secondary | ICD-10-CM | POA: Diagnosis present

## 2018-09-08 DIAGNOSIS — B49 Unspecified mycosis: Secondary | ICD-10-CM | POA: Diagnosis not present

## 2018-09-08 DIAGNOSIS — R040 Epistaxis: Secondary | ICD-10-CM | POA: Diagnosis not present

## 2018-09-08 DIAGNOSIS — K219 Gastro-esophageal reflux disease without esophagitis: Secondary | ICD-10-CM | POA: Diagnosis not present

## 2018-09-08 DIAGNOSIS — T451X5A Adverse effect of antineoplastic and immunosuppressive drugs, initial encounter: Secondary | ICD-10-CM | POA: Diagnosis present

## 2018-09-08 DIAGNOSIS — R0989 Other specified symptoms and signs involving the circulatory and respiratory systems: Secondary | ICD-10-CM | POA: Diagnosis not present

## 2018-09-08 DIAGNOSIS — R000 Tachycardia, unspecified: Secondary | ICD-10-CM | POA: Diagnosis present

## 2018-09-08 DIAGNOSIS — D7289 Other specified disorders of white blood cells: Secondary | ICD-10-CM | POA: Diagnosis not present

## 2018-09-08 DIAGNOSIS — J155 Pneumonia due to Escherichia coli: Secondary | ICD-10-CM | POA: Diagnosis not present

## 2018-09-08 DIAGNOSIS — I340 Nonrheumatic mitral (valve) insufficiency: Secondary | ICD-10-CM | POA: Diagnosis present

## 2018-09-08 DIAGNOSIS — N179 Acute kidney failure, unspecified: Secondary | ICD-10-CM | POA: Diagnosis not present

## 2018-09-08 DIAGNOSIS — Z66 Do not resuscitate: Secondary | ICD-10-CM | POA: Diagnosis present

## 2018-09-08 DIAGNOSIS — I517 Cardiomegaly: Secondary | ICD-10-CM | POA: Diagnosis not present

## 2018-09-08 DIAGNOSIS — N289 Disorder of kidney and ureter, unspecified: Secondary | ICD-10-CM | POA: Diagnosis not present

## 2018-09-08 DIAGNOSIS — Z20828 Contact with and (suspected) exposure to other viral communicable diseases: Secondary | ICD-10-CM | POA: Diagnosis not present

## 2018-09-08 DIAGNOSIS — E1122 Type 2 diabetes mellitus with diabetic chronic kidney disease: Secondary | ICD-10-CM | POA: Diagnosis not present

## 2018-09-08 DIAGNOSIS — D500 Iron deficiency anemia secondary to blood loss (chronic): Secondary | ICD-10-CM | POA: Diagnosis not present

## 2018-09-08 DIAGNOSIS — J984 Other disorders of lung: Secondary | ICD-10-CM | POA: Diagnosis not present

## 2018-09-08 DIAGNOSIS — R579 Shock, unspecified: Secondary | ICD-10-CM | POA: Diagnosis not present

## 2018-09-08 DIAGNOSIS — R7989 Other specified abnormal findings of blood chemistry: Secondary | ICD-10-CM | POA: Diagnosis present

## 2018-09-08 DIAGNOSIS — Z8249 Family history of ischemic heart disease and other diseases of the circulatory system: Secondary | ICD-10-CM

## 2018-09-08 DIAGNOSIS — R5381 Other malaise: Secondary | ICD-10-CM | POA: Diagnosis not present

## 2018-09-08 DIAGNOSIS — Z9079 Acquired absence of other genital organ(s): Secondary | ICD-10-CM

## 2018-09-08 DIAGNOSIS — A419 Sepsis, unspecified organism: Secondary | ICD-10-CM

## 2018-09-08 DIAGNOSIS — R6520 Severe sepsis without septic shock: Secondary | ICD-10-CM | POA: Diagnosis not present

## 2018-09-08 DIAGNOSIS — D701 Agranulocytosis secondary to cancer chemotherapy: Secondary | ICD-10-CM | POA: Diagnosis present

## 2018-09-08 DIAGNOSIS — D6959 Other secondary thrombocytopenia: Secondary | ICD-10-CM | POA: Diagnosis present

## 2018-09-08 DIAGNOSIS — R845 Abnormal microbiological findings in specimens from respiratory organs and thorax: Secondary | ICD-10-CM | POA: Diagnosis not present

## 2018-09-08 LAB — BLOOD GAS, ARTERIAL
Acid-Base Excess: 0.3 mmol/L (ref 0.0–2.0)
Bicarbonate: 23.5 mmol/L (ref 20.0–28.0)
Drawn by: 422461
FIO2: 100
O2 Content: 10 L/min
O2 Saturation: 98.2 %
Patient temperature: 98.2
pCO2 arterial: 33.4 mmHg (ref 32.0–48.0)
pH, Arterial: 7.461 — ABNORMAL HIGH (ref 7.350–7.450)
pO2, Arterial: 108 mmHg (ref 83.0–108.0)

## 2018-09-08 LAB — COMPREHENSIVE METABOLIC PANEL
ALT: 47 U/L — ABNORMAL HIGH (ref 0–44)
AST: 66 U/L — ABNORMAL HIGH (ref 15–41)
Albumin: 2.5 g/dL — ABNORMAL LOW (ref 3.5–5.0)
Alkaline Phosphatase: 45 U/L (ref 38–126)
Anion gap: 11 (ref 5–15)
BUN: 40 mg/dL — ABNORMAL HIGH (ref 8–23)
CO2: 22 mmol/L (ref 22–32)
Calcium: 8 mg/dL — ABNORMAL LOW (ref 8.9–10.3)
Chloride: 99 mmol/L (ref 98–111)
Creatinine, Ser: 2.26 mg/dL — ABNORMAL HIGH (ref 0.61–1.24)
GFR calc Af Amer: 34 mL/min — ABNORMAL LOW (ref >60)
GFR calc non Af Amer: 29 mL/min — ABNORMAL LOW (ref >60)
Glucose, Bld: 194 mg/dL — ABNORMAL HIGH (ref 70–99)
Potassium: 3.3 mmol/L — ABNORMAL LOW (ref 3.5–5.1)
Sodium: 132 mmol/L — ABNORMAL LOW (ref 135–145)
Total Bilirubin: 0.8 mg/dL (ref 0.3–1.2)
Total Protein: 5.2 g/dL — ABNORMAL LOW (ref 6.5–8.1)

## 2018-09-08 LAB — CBC WITH DIFFERENTIAL/PLATELET
Abs Immature Granulocytes: 0.08 10*3/uL — ABNORMAL HIGH (ref 0.00–0.07)
Basophils Absolute: 0 10*3/uL (ref 0.0–0.1)
Basophils Relative: 0 %
Eosinophils Absolute: 0 10*3/uL (ref 0.0–0.5)
Eosinophils Relative: 0 %
HCT: 23 % — ABNORMAL LOW (ref 39.0–52.0)
Hemoglobin: 7.8 g/dL — ABNORMAL LOW (ref 13.0–17.0)
Immature Granulocytes: 1 %
Lymphocytes Relative: 5 %
Lymphs Abs: 0.5 10*3/uL — ABNORMAL LOW (ref 0.7–4.0)
MCH: 31.7 pg (ref 26.0–34.0)
MCHC: 33.9 g/dL (ref 30.0–36.0)
MCV: 93.5 fL (ref 80.0–100.0)
Monocytes Absolute: 0.4 10*3/uL (ref 0.1–1.0)
Monocytes Relative: 5 %
Neutro Abs: 7.8 10*3/uL — ABNORMAL HIGH (ref 1.7–7.7)
Neutrophils Relative %: 89 %
Platelets: 90 10*3/uL — ABNORMAL LOW (ref 150–400)
RBC: 2.46 MIL/uL — ABNORMAL LOW (ref 4.22–5.81)
RDW: 16.7 % — ABNORMAL HIGH (ref 11.5–15.5)
WBC: 8.8 10*3/uL (ref 4.0–10.5)
nRBC: 0 % (ref 0.0–0.2)

## 2018-09-08 LAB — URINALYSIS, ROUTINE W REFLEX MICROSCOPIC
Bilirubin Urine: NEGATIVE
Glucose, UA: NEGATIVE mg/dL
Hgb urine dipstick: NEGATIVE
Ketones, ur: NEGATIVE mg/dL
Leukocytes,Ua: NEGATIVE
Nitrite: NEGATIVE
Protein, ur: NEGATIVE mg/dL
Specific Gravity, Urine: 1.011 (ref 1.005–1.030)
pH: 5 (ref 5.0–8.0)

## 2018-09-08 LAB — LACTATE DEHYDROGENASE: LDH: 488 U/L — ABNORMAL HIGH (ref 98–192)

## 2018-09-08 LAB — I-STAT CREATININE, ED: Creatinine, Ser: 2.3 mg/dL — ABNORMAL HIGH (ref 0.61–1.24)

## 2018-09-08 LAB — C-REACTIVE PROTEIN: CRP: 21.8 mg/dL — ABNORMAL HIGH (ref <1.0)

## 2018-09-08 LAB — FIBRINOGEN: Fibrinogen: 595 mg/dL — ABNORMAL HIGH (ref 210–475)

## 2018-09-08 LAB — TROPONIN I: Troponin I: 0.03 ng/mL (ref <0.03)

## 2018-09-08 LAB — SARS CORONAVIRUS 2 BY RT PCR (HOSPITAL ORDER, PERFORMED IN ~~LOC~~ HOSPITAL LAB): SARS Coronavirus 2: NEGATIVE

## 2018-09-08 LAB — PROCALCITONIN: Procalcitonin: 1.72 ng/mL

## 2018-09-08 LAB — CK: Total CK: 9 U/L — ABNORMAL LOW (ref 49–397)

## 2018-09-08 LAB — D-DIMER, QUANTITATIVE: D-Dimer, Quant: 1.73 ug/mL-FEU — ABNORMAL HIGH (ref 0.00–0.50)

## 2018-09-08 LAB — TRIGLYCERIDES: Triglycerides: 94 mg/dL (ref <150)

## 2018-09-08 LAB — SEDIMENTATION RATE: Sed Rate: 98 mm/hr — ABNORMAL HIGH (ref 0–16)

## 2018-09-08 LAB — MAGNESIUM: Magnesium: 1.7 mg/dL (ref 1.7–2.4)

## 2018-09-08 LAB — MRSA PCR SCREENING: MRSA by PCR: NEGATIVE

## 2018-09-08 LAB — LACTIC ACID, PLASMA: Lactic Acid, Venous: 1.1 mmol/L (ref 0.5–1.9)

## 2018-09-08 LAB — FERRITIN: Ferritin: 3307 ng/mL — ABNORMAL HIGH (ref 24–336)

## 2018-09-08 MED ORDER — ONDANSETRON HCL 4 MG/2ML IJ SOLN
4.0000 mg | Freq: Four times a day (QID) | INTRAMUSCULAR | Status: DC | PRN
  Administered 2018-09-20 – 2018-09-21 (×2): 4 mg via INTRAVENOUS
  Filled 2018-09-08 (×2): qty 2

## 2018-09-08 MED ORDER — LORAZEPAM 0.5 MG PO TABS: 0.5000 mg | ORAL_TABLET | Freq: Four times a day (QID) | ORAL | Status: DC | PRN

## 2018-09-08 MED ORDER — VANCOMYCIN HCL 10 G IV SOLR: 1250.0000 mg | INTRAVENOUS | Status: DC

## 2018-09-08 MED ORDER — VITAMIN B-6 100 MG PO TABS: 300.0000 mg | ORAL_TABLET | Freq: Every day | ORAL | Status: DC

## 2018-09-08 MED ORDER — SODIUM CHLORIDE 0.9 % IV BOLUS
1000.0000 mL | Freq: Once | INTRAVENOUS | Status: AC
  Administered 2018-09-08: 1000 mL via INTRAVENOUS

## 2018-09-08 MED ORDER — VANCOMYCIN HCL IN DEXTROSE 1-5 GM/200ML-% IV SOLN
1000.0000 mg | Freq: Once | INTRAVENOUS | Status: AC
  Administered 2018-09-08: 1000 mg via INTRAVENOUS
  Filled 2018-09-08: qty 200

## 2018-09-08 MED ORDER — VITAMIN B-6 100 MG PO TABS
300.0000 mg | ORAL_TABLET | Freq: Every day | ORAL | Status: DC
  Administered 2018-09-08 – 2018-09-11 (×3): 300 mg via ORAL
  Filled 2018-09-08 (×6): qty 3

## 2018-09-08 MED ORDER — SENNA 8.6 MG PO TABS
1.0000 | ORAL_TABLET | Freq: Two times a day (BID) | ORAL | Status: DC
  Administered 2018-09-09: 8.6 mg via ORAL
  Filled 2018-09-08 (×7): qty 1

## 2018-09-08 MED ORDER — ACETAMINOPHEN 650 MG RE SUPP
650.0000 mg | Freq: Four times a day (QID) | RECTAL | Status: DC | PRN
  Administered 2018-09-09: 650 mg via RECTAL
  Filled 2018-09-08: qty 1

## 2018-09-08 MED ORDER — ACETAMINOPHEN 325 MG PO TABS
650.0000 mg | ORAL_TABLET | Freq: Four times a day (QID) | ORAL | Status: DC | PRN
  Administered 2018-09-10: 650 mg via ORAL
  Filled 2018-09-08: qty 2

## 2018-09-08 MED ORDER — SODIUM CHLORIDE 0.9 % IV BOLUS
2000.0000 mL | Freq: Once | INTRAVENOUS | Status: AC
  Administered 2018-09-08: 2000 mL via INTRAVENOUS

## 2018-09-08 MED ORDER — VANCOMYCIN HCL 10 G IV SOLR
2000.0000 mg | Freq: Once | INTRAVENOUS | Status: DC
  Filled 2018-09-08: qty 2000

## 2018-09-08 MED ORDER — MAGNESIUM OXIDE 400 (241.3 MG) MG PO TABS
400.0000 mg | ORAL_TABLET | Freq: Every day | ORAL | Status: DC
  Administered 2018-09-08 – 2018-09-11 (×4): 400 mg via ORAL
  Filled 2018-09-08 (×4): qty 1

## 2018-09-08 MED ORDER — SODIUM CHLORIDE 0.9 % IV SOLN
2.0000 g | Freq: Once | INTRAVENOUS | Status: AC
  Administered 2018-09-08: 2 g via INTRAVENOUS
  Filled 2018-09-08: qty 2

## 2018-09-08 MED ORDER — ONDANSETRON HCL 4 MG PO TABS: 4.0000 mg | ORAL_TABLET | Freq: Four times a day (QID) | ORAL | Status: DC | PRN

## 2018-09-08 MED ORDER — MAGNESIUM 200 MG PO TABS: 400.0000 mg | ORAL_TABLET | Freq: Every day | ORAL | Status: DC

## 2018-09-08 MED ORDER — GABAPENTIN 300 MG PO CAPS
300.0000 mg | ORAL_CAPSULE | Freq: Three times a day (TID) | ORAL | Status: DC
  Administered 2018-09-08 – 2018-09-11 (×11): 300 mg via ORAL
  Filled 2018-09-08 (×11): qty 1

## 2018-09-08 MED ORDER — SODIUM CHLORIDE 0.9 % IV SOLN
2.0000 g | Freq: Two times a day (BID) | INTRAVENOUS | Status: DC
  Administered 2018-09-08 – 2018-09-14 (×12): 2 g via INTRAVENOUS
  Filled 2018-09-08 (×13): qty 2

## 2018-09-08 MED ORDER — HYDROCORTISONE NA SUCCINATE PF 100 MG IJ SOLR
100.0000 mg | Freq: Four times a day (QID) | INTRAMUSCULAR | Status: DC
  Administered 2018-09-08: 100 mg via INTRAVENOUS
  Filled 2018-09-08: qty 2

## 2018-09-08 NOTE — H&P (Addendum)
History and Physical    [name] Rose TGG:[id number] DOB: 1950/11/27 DOA: 09/08/2018  PCP: [name] Frees, MD  Patient coming from: Home  Chief Complaint: Weakness  HPI: [name] Rose is a 68 y.o. male with medical history significant of hypertension, hyperlipidemia, prediabetes, prostate cancer, metastasized bladder cancer on chemotherapy, PAF, CKD stage III, MSSA bacteremia.  Patient presented secondary to significant weakness overnight.  He reports no associated symptoms.  Weakness occurred acutely as he felt normal the day prior.  He reports receiving chemotherapy on a 2-week on 1 week off regimen.  Last round of chemotherapy was just over 1-1/2 weeks ago.  Patient reports not generally feeling this week with chemotherapy.  Weakness has improved slightly since coming to the hospital.  ED Course: Vitals: Afebrile, normal pulse, respirations in the mid 20s, initial blood pressure of 80s over 50s which has improved to about 100/70 patient with an SPO2 mid to high 90s on nonrebreather Labs: Sodium 132, potassium 3.3, glucose of 194, BUN of 40 and creatinine of 2.26, AST/ALT of 66 and 47 respectively, troponin of 0.03, lactic acid of 1.1, WBC of 8.8k with absolute lymphocyte count of 500 Imaging: Chest x-ray significant for diffuse interstitial/groundglass opacities concerning for atypical/viral pneumonia Medications/Course: 4 L NS bolus, vancomycin, cefepime  Review of Systems: Review of Systems  Constitutional: Negative for chills, fever and malaise/fatigue.  Respiratory: Negative for cough, sputum production, shortness of breath and wheezing.   Cardiovascular: Negative for chest pain.  Gastrointestinal: Negative for abdominal pain, constipation, diarrhea, nausea and vomiting.  Neurological: Positive for weakness.  All other systems reviewed and are negative.   Past Medical History:  Diagnosis Date  . Arthritis   . 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 ([place])  09/29/2016  . Bladder tumor   . Essential hypertension 06/23/2018  . Goals of care, counseling/discussion 09/30/2016  . History of prostate cancer followed by pcp dr [name] Rose-  per pt last PSA undetectable   dx 2008-- (Stage T1c, Gleason 3+3,  PSA 4.58, vol 99cc)  s/p  radical prostatectomy (nerve sparing bilateral)   . Hyperglycemia 06/23/2018  . Hypertension   . Lower urinary tract symptoms (LUTS)   . Mild hyperlipidemia 06/23/2018  . Pre-diabetes   . Wears glasses     Past Surgical History:  Procedure Laterality Date  . CATARACT EXTRACTION W/ INTRAOCULAR LENS  IMPLANT, BILATERAL Bilateral 2011  . [place]  . IR FLUORO GUIDE PORT INSERTION RIGHT  10/07/2016  . IR RADIOLOGIST EVAL & MGMT  01/05/2018  . IR US GUIDE VASC ACCESS RIGHT  10/07/2016  . KNEE ARTHROSCOPY Bilateral right 2006;  left 02-15-2007  . [place];  1990;  1983  . ROBOT ASSISTED LAPAROSCOPIC RADICAL PROSTATECTOMY  06/20/2006   bilateral nerve sparing  . TEE WITHOUT CARDIOVERSION N/A 06/27/2018   Procedure: TRANSESOPHAGEAL ECHOCARDIOGRAM (TEE);  Surgeon: [name] Mormon, MD;  Location: [facility] ENDOSCOPY;  Service: Cardiovascular;  Laterality: N/A;  . TOTAL HIP ARTHROPLASTY Left 03/03/2015   Procedure: LEFT TOTAL HIP ARTHROPLASTY ANTERIOR APPROACH;  Surgeon: [name] Leitz, MD;  Location: [place];  Service: Orthopedics;  Laterality: Left;  . TOTAL KNEE ARTHROPLASTY Bilateral left 08-13-2009;  right 12-26-2009  . TRANSURETHRAL RESECTION OF BLADDER TUMOR N/A 09/20/2016   Procedure: TRANSURETHRAL RESECTION OF BLADDER TUMOR (TURBT);  Surgeon: [name] Gallo, MD;  Location: [facility];  Service: Urology;  Laterality: N/A;     reports that he quit smoking about 33 years ago. His  smoking use included cigarettes. He quit after 16.00 years of use. He has never used smokeless tobacco. He reports current alcohol use. He reports that he does not use drugs.  No Known Allergies  Family  History  Problem Relation Age of Onset  . Hypertension Mother   . Aneurysm Mother   . Emphysema Father   . Hypertension Father     Prior to Admission medications   Medication Sig Start Date End Date Taking? Authorizing Provider  Cinnamon 500 MG TABS Take 1 tablet by mouth 2 (two) times daily.    Yes [provider]  dexamethasone (DECADRON) 4 MG tablet Take 2 tablets (8 mg total) by mouth daily. Start the day after chemotherapy for 2 days. 07/17/18  Yes [name] Napoleon, MD  diltiazem (CARDIZEM CD) 240 MG 24 hr capsule Take 1 capsule (240 mg total) by mouth daily. 06/28/18  Yes Purohit, Konrad Dolores, MD  gabapentin (NEURONTIN) 400 MG capsule TAKE 1 CAPSULE (400 MG TOTAL) BY MOUTH 4 (FOUR) TIMES DAILY. 09/05/18  Yes [name] Napoleon, MD  lidocaine-prilocaine (EMLA) cream Apply to affected area once 07/17/18  Yes Ennever, Rudell Cobb, MD  LORazepam (ATIVAN) 0.5 MG tablet TAKE 1 TABLET BY MOUTH EVERY 6 HOURS AS NEEDED FOR NAUSEA AND VOMITING Patient taking differently: Take 0.5 mg by mouth every 6 (six) hours as needed (nausea).  08/30/18  Yes [place], Holli Humbles, NP  losartan (COZAAR) 100 MG tablet Take 100 mg by mouth daily.   Yes [provider]  MAGNESIUM PO Take 400 mg by mouth at bedtime. Leg cramps   Yes [provider]  metoprolol tartrate (LOPRESSOR) 25 MG tablet Take 1 tablet (25 mg total) by mouth 2 (two) times daily. 06/28/18  Yes Purohit, Konrad Dolores, MD  ondansetron (ZOFRAN) 8 MG tablet Take 1 tablet (8 mg total) by mouth 2 (two) times daily as needed for refractory nausea / vomiting. Start on day 3 after chemo. 07/17/18  Yes [name] Napoleon, MD  prochlorperazine (COMPAZINE) 10 MG tablet Take 1 tablet (10 mg total) by mouth every 6 (six) hours as needed (Nausea or vomiting). 07/17/18  Yes Ennever, Rudell Cobb, MD  Pyridoxine HCl (VITAMIN B-6) 250 MG tablet Take 1 tablet (250 mg total) by mouth daily. 12/23/17  Yes [name] August, MD  senna (SENOKOT) 8.6 MG TABS tablet Take 1 tablet  (8.6 mg total) by mouth 2 (two) times daily. 12/23/17   [name] August, MD    Physical Exam:  Physical Exam Constitutional:      General: He is not in acute distress.    Appearance: He is well-developed. He is not diaphoretic.  Eyes:     Conjunctiva/sclera: Conjunctivae normal.     Pupils: Pupils are equal, round, and reactive to light.  Neck:     Musculoskeletal: Normal range of motion.  Cardiovascular:     Rate and Rhythm: Normal rate and regular rhythm.     Heart sounds: Normal heart sounds. No murmur.  Pulmonary:     Effort: Pulmonary effort is normal. No respiratory distress.     Breath sounds: Decreased breath sounds and rales present. No wheezing.  Abdominal:     General: Bowel sounds are normal. There is no distension.     Palpations: Abdomen is soft.     Tenderness: There is no abdominal tenderness. There is no guarding or rebound.  Musculoskeletal: Normal range of motion.        General: No tenderness.  Lymphadenopathy:  Cervical: No cervical adenopathy.  Skin:    General: Skin is warm and dry.  Neurological:     Mental Status: He is alert and oriented to person, place, and time.     Labs on Admission: I have personally reviewed following labs and imaging studies  CBC: Recent Labs  Lab 09/08/18 0255  WBC 8.8  NEUTROABS 7.8*  HGB 7.8*  HCT 23.0*  MCV 93.5  PLT 90*    Basic Metabolic Panel: Recent Labs  Lab 09/08/18 0255 09/08/18 0312 09/08/18 0452  NA 132*  --   --   K 3.3*  --   --   CL 99  --   --   CO2 22  --   --   GLUCOSE 194*  --   --   BUN 40*  --   --   CREATININE 2.26*  --  2.30*  CALCIUM 8.0*  --   --   MG  --  1.7  --     GFR: Estimated Creatinine Clearance: 41.7 mL/min (A) (by C-G formula based on SCr of 2.3 mg/dL (H)).  Liver Function Tests: Recent Labs  Lab 09/08/18 0255  AST 66*  ALT 47*  ALKPHOS 45  BILITOT 0.8  PROT 5.2*  ALBUMIN 2.5*   No results for input(s): LIPASE, AMYLASE in the last 168 hours. No  results for input(s): AMMONIA in the last 168 hours.  Coagulation Profile: No results for input(s): INR, PROTIME in the last 168 hours.  Cardiac Enzymes: Recent Labs  Lab 09/08/18 0255 09/08/18 0312  CKTOTAL 9*  --   TROPONINI  --  0.03*    BNP (last 3 results) No results for input(s): PROBNP in the last 8760 hours.  HbA1C: No results for input(s): HGBA1C in the last 72 hours.  CBG: Recent Labs  Lab 09/05/18 0704 09/05/18 0841  GLUCAP 171* 171*    Lipid Profile: No results for input(s): CHOL, HDL, LDLCALC, TRIG, CHOLHDL, LDLDIRECT in the last 72 hours.  Thyroid Function Tests: No results for input(s): TSH, T4TOTAL, FREET4, T3FREE, THYROIDAB in the last 72 hours.  Anemia Panel: No results for input(s): VITAMINB12, FOLATE, FERRITIN, TIBC, IRON, RETICCTPCT in the last 72 hours.  Urine analysis:    Component Value Date/Time   COLORURINE YELLOW 09/08/2018 0825   APPEARANCEUR HAZY (A) 09/08/2018 0825   LABSPEC 1.011 09/08/2018 0825   PHURINE 5.0 09/08/2018 0825   GLUCOSEU NEGATIVE 09/08/2018 0825   HGBUR NEGATIVE 09/08/2018 0825   BILIRUBINUR NEGATIVE 09/08/2018 0825   KETONESUR NEGATIVE 09/08/2018 0825   PROTEINUR NEGATIVE 09/08/2018 0825   UROBILINOGEN 0.2 01/31/2010 0624   NITRITE NEGATIVE 09/08/2018 0825   LEUKOCYTESUR NEGATIVE 09/08/2018 0825     Radiological Exams on Admission: Dg Chest Port 1 View  Result Date: 09/08/2018 CLINICAL DATA:  Sepsis EXAM: PORTABLE CHEST 1 VIEW COMPARISON:  06/23/2018, PET CT 09/05/2018 FINDINGS: Right-sided central venous port tip over the SVC. Stable mild cardiomegaly. Mild diffuse interstitial and ground-glass opacity. No consolidation or effusion. No pneumothorax. IMPRESSION: 1. Mild diffuse interstitial and ground-glass opacity, possible atypical or viral pneumonia 2. Mild cardiomegaly Electronically Signed   By: Donavan Foil M.D.   On: 09/08/2018 03:22    EKG: Independently reviewed. Sinus rhythm with one PVC   Assessment/Plan Principal Problem:   Pneumonia Active Problems:   LFT elevation   AKI (acute kidney injury) ([place])   CKD (chronic kidney disease), stage III ([facility])  Pneumonia Unknown type. Atypical pattern on chest x-ray so possibly viral, however,  patient is also immunocompromised secondary to current chemotherapy treatments. Significant hypoxia. Although initial COVID-19 testing was negative, this is still higher in the differential at this time. Did not meet SIRS/Sepsis criteria on admission. -Continue HCAP coverage with Vancomycin/Cefepime -Repeat COVID-19 testing with send-out test -Contact/airbourne precautions for now -Blood cultures -Procalcitonin  Acute respiratory failure with hypoxia Secondary to above. -Keep O2 saturations >92% -Wean to room air as able  Hypotension Likely secondary to infection although patient is also on multiple blood pressure medications. Improved with IV fluid boluses. Started on stress dose steroids, however, patient is not on chronic steroid therapy. This possibly contributed to patient's weakness as well. -Discontinue stress doses for now -Continue IV fluids -Will hold antihypertensives for now, but low threshold to restart antiarrhythmics at least if BP can tolerate  Acute kidney injury on CKD stage 3 Baseline creatinine of 1.7-1.8. Creatinine of 2.3 on admission in setting of hypotension. He is s/p significant IV fluid resuscitation. -AM BMP -Renally dose medications (gabapentin dose reduced)  Acute on chronic anemia Baseline hemoglobin of 9-10. Down to 7.8 today. Unknown etiology. No bleeding noted. Recently underwent chemotherapy, however. -CBC daily  Chronic thrombocytopenia Stable. -SCDs  Essential hypertension -Please see above plan  Paroxysmal atrial fibrillation Patient is on metoprolol and diltiazem as an outpatient. Currently mostly rate controlled and in sinus rhythm. Not on anticoagulation secondary to  anemia/thrombocytopenia. -Hold metoprolol and diltiazem for now, but will restart as soon as able  Metastatic bladder cancer Currently on chemotherapy treatment. Follows with [name]. Last chemotherapy treatment was 5/18 per patient.    DVT prophylaxis: SCDs Code Status: Full code Family Communication: Patient declined for me to call wife Disposition Plan: Stepdown unit Consults called: None Admission status: Inpatient   Cordelia Poche, MD [facility] 09/08/2018, 11:10 AM  If 7PM-7AM, please contact night-coverage [email or web address] Password [email or web address]

## 2018-09-08 NOTE — ED Notes (Signed)
EKG given to EDP,Cardama,MD., for review. 

## 2018-09-08 NOTE — ED Provider Notes (Signed)
[facility] DEPT Provider Note   CSN: [id number] Arrival date & time: 09/08/18  0227    History   Chief Complaint Chief Complaint  Patient presents with  . Other    generalized weakness    HPI [name] Rose is a 68 y.o. male.     68 y.o. 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  Patient states that he was unable to get out of his recliner yesterday.  This is unusual for him.  Today he felt persistently weak.  Tried to crawl across the floor to get around, but was unable to lift himself back up off the floor.  He has been feeling increasingly short of breath recently.  This is aggravated with exertion.  Further notes some lightheadedness with near syncope.  He has no associated fevers, syncope, chest pain, abdominal pain, nausea, vomiting, hemoptysis.  Arrived to the ED satting 100% on nonrebreather on 12 L.  He has no history of chronic oxygen requirement.  Denies history of DVT/PE.  He is not on chronic anticoagulation.  FULL CODE STATUS     Past Medical History:  Diagnosis Date  . Arthritis   . 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 ([place]) 09/29/2016  . Bladder tumor   . Essential hypertension 06/23/2018  . Goals of care, counseling/discussion 09/30/2016  . History of prostate cancer followed by pcp dr [name]-  per pt last PSA undetectable   dx 2008-- (Stage T1c, Gleason 3+3,  PSA 4.58, vol 99cc)  s/p  radical prostatectomy (nerve sparing bilateral)   . Hyperglycemia 06/23/2018  . Hypertension   . Lower urinary tract symptoms (LUTS)   . Mild hyperlipidemia 06/23/2018  . Pre-diabetes   . Wears glasses     Patient Active Problem List   Diagnosis Date Noted  . Mitral valve insufficiency 08/17/2018  . Drug rash   . Bacteremia   .  Essential hypertension 06/23/2018  . Hyperglycemia 06/23/2018  . Mild hyperlipidemia 06/23/2018  . Mucositis due to chemotherapy 06/22/2018  . AKI (acute kidney injury) ([place]) 06/22/2018  . Pancytopenia ([place]) 06/22/2018  . Staphylococcus aureus bacteremia   . Neutropenic fever ([place]) 06/21/2018  . Sepsis ([place])   . Thrombocytopenia ([place])   . PAF (paroxysmal atrial fibrillation) ([place])   . Abscess of abdominal cavity ([place]) 12/19/2017  . IDA (iron deficiency anemia) 09/08/2017  . 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 Anorexia 10/17/2016  . Diarrhea 10/17/2016  . LFT elevation 10/17/2016  . Dehydration 10/17/2016  . Fatigue 10/17/2016  . Hypocalcemia 10/17/2016  . Goals of care, counseling/discussion 09/30/2016  . 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 ([place]) 09/29/2016  . Cancer of dome of urinary bladder ([place]) 09/20/2016  . Postoperative anemia due to acute blood loss 03/04/2015  . Hypokalemia 03/04/2015  . 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 CATARACT EXTRACTION W/ INTRAOCULAR LENS  IMPLANT, BILATERAL Bilateral 2011  . [place]  . IR FLUORO GUIDE PORT INSERTION RIGHT  10/07/2016  . IR RADIOLOGIST EVAL & MGMT  01/05/2018  . IR US GUIDE VASC ACCESS RIGHT  10/07/2016  . KNEE ARTHROSCOPY Bilateral right 2006;  left 02-15-2007  . [place];  1990;  [place] PROSTATECTOMY  06/20/2006   bilateral nerve sparing  . TEE WITHOUT CARDIOVERSION N/A 06/27/2018   Procedure: TRANSESOPHAGEAL ECHOCARDIOGRAM (TEE);  Surgeon: Nigel Mormon, MD;  Location[facility] ENDOSCOPY;  Service: Cardiovascular;  Laterality: N/A;  . TOTAL HIP ARTHROPLASTY Left 03/03/2015   Procedure: LEFT TOTAL HIP ARTHROPLASTY ANTERIOR APPROACH;  Surgeon: Dorna Leitz, MD;  Location: [facility];  Service: Orthopedics;   Laterality: Left;  . TOTAL KNEE ARTHROPLASTY Bilateral left 08-13-2009;  right 12-26-2009  . TRANSURETHRAL RESECTION OF BLADDER TUMOR N/A 09/20/2016   Procedure: TRANSURETHRAL RESECTION OF BLADDER TUMOR (TURBT);  Surgeon: Franchot Gallo, MD;  Location: [facility];  Service: Urology;  Laterality: N/A;        Home Medications    Prior to Admission medications   Medication Sig Start Date End Date Taking? Authorizing Provider  Cinnamon 500 MG TABS Take 1 tablet by mouth 2 (two) times daily.    Yes [provider]  dexamethasone (DECADRON) 4 MG tablet Take 2 tablets (8 mg total) by mouth daily. Start the day after chemotherapy for 2 days. 07/17/18  Yes Volanda Napoleon, MD  diltiazem (CARDIZEM CD) 240 MG 24 hr capsule Take 1 capsule (240 mg total) by mouth daily. 06/28/18  Yes Purohit, Konrad Dolores, MD  gabapentin (NEURONTIN) 400 MG capsule TAKE 1 CAPSULE (400 MG TOTAL) BY MOUTH 4 (FOUR) TIMES DAILY. 09/05/18  Yes Volanda Napoleon, MD  lidocaine-prilocaine (EMLA) cream Apply to affected area once 07/17/18  Yes Ennever, Rudell Cobb, MD  LORazepam (ATIVAN) 0.5 MG tablet TAKE 1 TABLET BY MOUTH EVERY 6 HOURS AS NEEDED FOR NAUSEA AND VOMITING Patient taking differently: Take 0.5 mg by mouth every 6 (six) hours as needed (nausea).  08/30/18  Yes [place], Holli Humbles, NP  losartan (COZAAR) 100 MG tablet Take 100 mg by mouth daily.   Yes [provider]  MAGNESIUM PO Take 400 mg by mouth at bedtime. Leg cramps   Yes [provider]  metoprolol tartrate (LOPRESSOR) 25 MG tablet Take 1 tablet (25 mg total) by mouth 2 (two) times daily. 06/28/18  Yes Purohit, Konrad Dolores, MD  ondansetron (ZOFRAN) 8 MG tablet Take 1 tablet (8 mg total) by mouth 2 (two) times daily as needed for refractory nausea / vomiting. Start on day 3 after chemo. 07/17/18  Yes Volanda Napoleon, MD  prochlorperazine (COMPAZINE) 10 MG tablet Take 1 tablet (10 mg total) by mouth every 6 (six) hours as needed (Nausea or  vomiting). 07/17/18  Yes Ennever, Rudell Cobb, MD  Pyridoxine HCl (VITAMIN B-6) 250 MG tablet Take 1 tablet (250 mg total) by mouth daily. 12/23/17  Yes Aline August, MD  senna (SENOKOT) 8.6 MG TABS tablet Take 1 tablet (8.6 mg total) by mouth 2 (two) times daily. 12/23/17   Aline August, MD    Family History Family History  Problem Relation Age of Onset  . Hypertension Mother   . Aneurysm Mother   . Emphysema Father   . Hypertension Father     Social History Social History   Tobacco Use  . Smoking status: Former Smoker    Years: 16.00    Types: Cigarettes    Last attempt to quit: 09/25/1984    Years since quitting: 33.9  . Smokeless tobacco: Never Used  Substance Use Topics  . Alcohol use: Yes    Comment: occasionally  . Drug use: No     Allergies   Patient has no known allergies.  Review of Systems Review of Systems Ten systems reviewed and are negative for acute change, except as noted in the HPI.    Physical Exam Updated Vital Signs BP 92/60   Pulse 90   Temp 98.2 F (36.8 C) (Oral)   Resp (!) 27   SpO2 100%   Physical Exam Vitals signs and nursing note reviewed.  Constitutional:      General: He is not in acute distress.    Appearance: He is well-developed. He is not diaphoretic.     Comments: Pleasant and in NAD  HENT:     Head: Normocephalic and atraumatic.  Eyes:     General: No scleral icterus.    Conjunctiva/sclera: Conjunctivae normal.  Neck:     Musculoskeletal: Normal range of motion.  Cardiovascular:     Rate and Rhythm: Regular rhythm. Tachycardia present.     Pulses: Normal pulses.  Pulmonary:     Effort: Pulmonary effort is normal. No respiratory distress.     Breath sounds: No stridor. No wheezing, rhonchi or rales.     Comments: No wheezing, rales, rhonchi. No tachypnea or dyspnea. Sats drop to 78% on room air when off NRB. Placed on 10L via NRB with sats improving to 100%.  Musculoskeletal: Normal range of motion.  Skin:     General: Skin is warm and dry.     Coloration: Skin is not pale.     Findings: No erythema or rash.  Neurological:     General: No focal deficit present.     Mental Status: He is alert and oriented to person, place, and time.     Coordination: Coordination normal.     Comments: GCS 15.  Speech is goal oriented.  Moving all extremities spontaneously.  Psychiatric:        Behavior: Behavior normal.      ED Treatments / Results  Labs (all labs ordered are listed, but only abnormal results are displayed) Labs Reviewed  COMPREHENSIVE METABOLIC PANEL - Abnormal; Notable for the following components:      Result Value   Sodium 132 (*)    Potassium 3.3 (*)    Glucose, Bld 194 (*)    BUN 40 (*)    Creatinine, Ser 2.26 (*)    Calcium 8.0 (*)    Total Protein 5.2 (*)    Albumin 2.5 (*)    AST 66 (*)    ALT 47 (*)    GFR calc non Af Amer 29 (*)    GFR calc Af Amer 34 (*)    All other components within normal limits  CBC WITH DIFFERENTIAL/PLATELET - Abnormal; Notable for the following components:   RBC 2.46 (*)    Hemoglobin 7.8 (*)    HCT 23.0 (*)    RDW 16.7 (*)    Platelets 90 (*)    Neutro Abs 7.8 (*)    Lymphs Abs 0.5 (*)    Abs Immature Granulocytes 0.08 (*)    All other components within normal limits  CK - Abnormal; Notable for the following components:   Total CK 9 (*)    All other components within normal limits  BLOOD GAS, ARTERIAL - Abnormal; Notable for the following components:   pH, Arterial 7.461 (*)    All other components within normal limits  TROPONIN I - Abnormal; Notable for the following components:   Troponin I 0.03 (*)    All other components within normal limits  I-STAT CREATININE, ED - Abnormal; Notable for the following  components:   Creatinine, Ser 2.30 (*)    All other components within normal limits  SARS CORONAVIRUS 2 (HOSPITAL ORDER, PERFORMED IN [facility] LAB)  CULTURE, BLOOD (ROUTINE X 2)  CULTURE, BLOOD (ROUTINE X 2)  URINE  CULTURE  LACTIC ACID, PLASMA  MAGNESIUM  LACTIC ACID, PLASMA  URINALYSIS, ROUTINE W REFLEX MICROSCOPIC    EKG None  Radiology Dg Chest Port 1 View  Result Date: 09/08/2018 CLINICAL DATA:  Sepsis EXAM: PORTABLE CHEST 1 VIEW COMPARISON:  06/23/2018, PET CT 09/05/2018 FINDINGS: Right-sided central venous port tip over the SVC. Stable mild cardiomegaly. Mild diffuse interstitial and ground-glass opacity. No consolidation or effusion. No pneumothorax. IMPRESSION: 1. Mild diffuse interstitial and ground-glass opacity, possible atypical or viral pneumonia 2. Mild cardiomegaly Electronically Signed   By: Donavan Foil M.D.   On: 09/08/2018 03:22    Procedures .Critical Care Performed by: Antonietta Breach, PA-C Authorized by: Antonietta Breach, PA-C   Critical care provider statement:    Critical care time (minutes):  45   Critical care was necessary to treat or prevent imminent or life-threatening deterioration of the following conditions:  Respiratory failure and sepsis   Critical care was time spent personally by me on the following activities:  Discussions with consultants, evaluation of patient's response to treatment, examination of patient, ordering and performing treatments and interventions, ordering and review of laboratory studies, ordering and review of radiographic studies, pulse oximetry, re-evaluation of patient's condition, obtaining history from patient or surrogate and review of old charts   (including critical care time)  Medications Ordered in ED Medications  vancomycin (VANCOCIN) IVPB 1000 mg/200 mL premix (1,000 mg Intravenous New Bag/Given 09/08/18 0509)  ceFEPIme (MAXIPIME) 2 g in sodium chloride 0.9 % 100 mL IVPB (2 g Intravenous New Bag/Given 09/08/18 0507)  sodium chloride 0.9 % bolus 1,000 mL (has no administration in time range)  sodium chloride 0.9 % bolus 2,000 mL (2,000 mLs Intravenous New Bag/Given 09/08/18 0415)    4:43 AM Patient unable to get CT PE study as his  creatinine is 2.26 with a GFR of 29.  This study was discontinued.  He is pending coronavirus testing.  Chest x-ray shows bilateral groundglass opacities.  Tachycardia has improved with IV fluids.  Abx ordered for HCAP coverage given admission in March.  5:52 AM Case discussed with Dr. Blaine Hamper who will admit to hospitalist service.    Initial Impression / Assessment and Plan / ED Course  I have reviewed the triage vital signs and the nursing notes.  Pertinent labs & imaging results that were available during my care of the patient were reviewed by me and considered in my medical decision making (see chart for details).        68 year old male with hx of metastatic bladder CA (on chemo infusions) to be admitted for management of acute respiratory failure with hypoxia.  Had desaturations down to at least 78% on room air at which time he was placed back on a nonrebreather at 10 L with improvement in his saturations to 100%.  He has no respiratory distress, only complains of generalized weakness over the past 2 days.  Chest x-ray concerning for bilateral groundglass opacities, possibly atypical or viral pneumonia.  His coronavirus testing is negative.  Given his history of cancer, PE was considered; however, patient unable to undergo CTA due to acute kidney injury.  Sepsis orders placed on arrival.  The patient has been covered with vancomycin and cefepime given his history of admission for  MSSA bacteremia in March 2020.  He has remained hypotensive, though MAPs have remained reassuring and >60.  Currently receiving 4th liter of IVF.  Tachycardia has improved. TRH service to admit.   Final Clinical Impressions(s) / ED Diagnoses   Final diagnoses:  Acute respiratory failure with hypoxia (HCC)  Sepsis, due to unspecified organism, unspecified whether acute organ dysfunction present [facility])  Atypical pneumonia    ED Discharge Orders    None       Antonietta Breach, PA-C 09/08/18 0612    Fatima Blank, MD 09/13/18 [phone]

## 2018-09-08 NOTE — TOC Initial Note (Signed)
Transition of Care [facility]) - Initial/Assessment Note    Patient Details  Name: [name] Rose MRN: [id number] Date of Birth: 09/13/1950  Transition of Care (TOC) CM/SW Contact:    Joaquin Courts, RN Phone Number: 09/08/2018, 2:31 PM  Clinical Narrative:                   Expected Discharge Plan: Home/Self Care Barriers to Discharge: Continued Medical Work up   Patient Goals and CMS Choice        Expected Discharge Plan and Services Expected Discharge Plan: Home/Self Care       Living arrangements for the past 2 months: Single Family Home Expected Discharge Date: (unknown)                                    Prior Living Arrangements/Services Living arrangements for the past 2 months: Single Family Home Lives with:: Spouse Patient language and need for interpreter reviewed:: Yes Do you feel safe going back to the place where you live?: Yes      Need for Family Participation in Patient Care: Yes (Comment) Care giver support system in place?: Yes (comment)   Criminal Activity/Legal Involvement Pertinent to Current Situation/Hospitalization: No - Comment as needed  Activities of Daily Living Home Assistive Devices/Equipment: Eyeglasses ADL Screening (condition at time of admission) Patient's cognitive ability adequate to safely complete daily activities?: Yes Is the patient deaf or have difficulty hearing?: No Does the patient have difficulty seeing, even when wearing glasses/contacts?: No Does the patient have difficulty concentrating, remembering, or making decisions?: No Patient able to express need for assistance with ADLs?: Yes Does the patient have difficulty dressing or bathing?: Yes Independently performs ADLs?: No Communication: Independent Dressing (OT): Needs assistance Is this a change from baseline?: Change from baseline, expected to last >3 days Grooming: Needs assistance Is this a change from baseline?: Change from baseline, expected to  last >3 days Feeding: Needs assistance Is this a change from baseline?: Change from baseline, expected to last >3 days Bathing: Needs assistance Is this a change from baseline?: Change from baseline, expected to last >3 days Toileting: Needs assistance Is this a change from baseline?: Change from baseline, expected to last >3days In/Out Bed: Needs assistance Is this a change from baseline?: Change from baseline, expected to last >3 days Walks in Home: Needs assistance Is this a change from baseline?: Change from baseline, expected to last >3 days Does the patient have difficulty walking or climbing stairs?: Yes(secondary to weakness) Weakness of Legs: Both Weakness of Arms/Hands: Both  Permission Sought/Granted                  Emotional Assessment Appearance:: Appears stated age       Alcohol / Substance Use: Alcohol Use, Tobacco Use(former smoker) Psych Involvement: No (comment)  Admission diagnosis:  Atypical pneumonia [J18.9] Acute respiratory failure with hypoxia (HCC) [J96.01] Sepsis, due to unspecified organism, unspecified whether acute organ dysfunction present [facility]) [A41.9] Patient Active Problem List   Diagnosis Date Noted  . CKD (chronic kidney disease), stage III ([place]) 09/08/2018  . Mitral valve insufficiency 08/17/2018  . Drug rash   . Bacteremia   . Essential hypertension 06/23/2018  . Hyperglycemia 06/23/2018  . Mild hyperlipidemia 06/23/2018  . Mucositis due to chemotherapy 06/22/2018  . AKI (acute kidney injury) ([place]) 06/22/2018  . Pancytopenia ([place]) 06/22/2018  . Staphylococcus aureus bacteremia   .  Neutropenic fever ([place]) 06/21/2018  . Pneumonia   . Thrombocytopenia ([place])   . PAF (paroxysmal atrial fibrillation) ([place])   . Abscess of abdominal cavity ([place]) 12/19/2017  . IDA (iron deficiency anemia) 09/08/2017  . 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 Anorexia 10/17/2016  . Diarrhea 10/17/2016   . LFT elevation 10/17/2016  . Dehydration 10/17/2016  . Fatigue 10/17/2016  . Hypocalcemia 10/17/2016  . Goals of care, counseling/discussion 09/30/2016  . 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 ([place]) 09/29/2016  . Cancer of dome of urinary bladder ([place]) 09/20/2016  . Postoperative anemia due to acute blood loss 03/04/2015  . Hypokalemia 03/04/2015  . 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/03/2015   PCP:  Shirline Frees, MD Pharmacy:   [id number] - [place], [address] [place] [address] Phone: [phone] Fax: [place], [place] - [address] [place] [address] Phone: [phone] Fax: [phone]     Social Determinants of Health (SDOH) Interventions    Readmission Risk Interventions Readmission Risk Prevention Plan 09/08/2018 06/28/2018  Transportation Screening Complete Complete  PCP or Specialist Appt within 3-5 Days - Complete  HRI or [place] - Complete  Social Work Consult for [place] Planning/Counseling - Complete  Palliative Care Screening - Not Applicable  Medication Review Press photographer) Complete Complete  PCP or Specialist appointment within 3-5 days of discharge Not Complete -  PCP/Specialist Appt Not Complete comments not yet ready for d/c -  HRI or [place] Not Complete -  [place] or [place] Pt Refusal Comments no needs identified at this time -  SW Recovery Care/Counseling Consult Not Complete -  SW Consult Not Complete Comments no needs identified at this time -  Palliative Care Screening Not Applicable -  [place] Not Applicable -  Some recent data might be hidden

## 2018-09-08 NOTE — Care Management (Signed)
This is a no charge note  Pending admission per PA, [name] Rose  68 year old man with  past medical history of hypertension, hyperlipidemia, prediabetes, prostate cancer, metastasized bladder cancer on chemotherapy, PAF, CKD stage III, MSSA bacteremia, who presents with generalized weakness and shortness of breath, has oxygen desaturation to 78% on room air.   Patient was found to have negative COVID-19, WBC 8.8, lactic acid 1.1, worsening renal function, temperature normal, soft blood pressure, tachycardia, tachypnea.  Chest x-ray showed groundglass atypical infection pattern.  Etiology is not completely clear.  Vancomycin and cefepime were started in the ED.  Will check cortisol level and then give Solu-Cortef 100 mg as stress dose.  Patient is admitted to stepdown as inpatient.  [name] Costa, MD  [facility]   If 7PM-7AM, please contact night-coverage [email or web address] Password [facility] 09/08/2018, 6:46 AM

## 2018-09-08 NOTE — Progress Notes (Signed)
A consult was received from an ED physician for cefepime and vancomycin per pharmacy dosing.  The patient's profile has been reviewed for ht/wt/allergies/indication/available labs.   A one time order has been placed for Cefepime 2 Gm and Vancomycin 1 Gm.  Further antibiotics/pharmacy consults should be ordered by admitting physician if indicated.                       Thank you, Dorrene German 09/08/2018  5:04 AM

## 2018-09-08 NOTE — ED Triage Notes (Signed)
Pt from home called EMS d/t generalized weakness x8 hrs. Hx bladder CA , pt found on  Floor family states pt is usually weak after Chem and family believes he is currently in remission. NSR on monitor HR 102 VSS BP: 102/P Hr 102 resp 16 Sat  100% on NRB mask on 12L O2. Temp 98.2

## 2018-09-08 NOTE — Progress Notes (Signed)
PHARMACY NOTE -  Cefepime  Pharmacy has been assisting with dosing of cefepime for HCAP.  Cefepime 2g q12 hr based on renal function  Pharmacy will sign off, following peripherally for culture results or dose adjustments. Please reconsult if a change in clinical status warrants re-evaluation of dosage.  Reuel Boom, PharmD, BCPS [phone] 09/08/2018, 6:03 PM

## 2018-09-08 NOTE — ED Notes (Signed)
PT have been made aware of urine sample. Urinal in hand will info staff when able to provide

## 2018-09-08 NOTE — ED Notes (Signed)
Attempted to call report to receiving unit. Stated would call back for report

## 2018-09-09 ENCOUNTER — Inpatient Hospital Stay (HOSPITAL_COMMUNITY): Payer: PPO

## 2018-09-09 DIAGNOSIS — J9601 Acute respiratory failure with hypoxia: Secondary | ICD-10-CM | POA: Diagnosis present

## 2018-09-09 DIAGNOSIS — R0602 Shortness of breath: Secondary | ICD-10-CM

## 2018-09-09 DIAGNOSIS — I2699 Other pulmonary embolism without acute cor pulmonale: Secondary | ICD-10-CM

## 2018-09-09 DIAGNOSIS — J155 Pneumonia due to Escherichia coli: Secondary | ICD-10-CM

## 2018-09-09 DIAGNOSIS — B59 Pneumocystosis: Secondary | ICD-10-CM

## 2018-09-09 DIAGNOSIS — C799 Secondary malignant neoplasm of unspecified site: Secondary | ICD-10-CM

## 2018-09-09 DIAGNOSIS — J189 Pneumonia, unspecified organism: Secondary | ICD-10-CM | POA: Insufficient documentation

## 2018-09-09 LAB — INTERLEUKIN-6, PLASMA: Interleukin-6, Plasma: 239.6 pg/mL — ABNORMAL HIGH (ref 0.0–12.2)

## 2018-09-09 LAB — CBC WITH DIFFERENTIAL/PLATELET
Abs Immature Granulocytes: 0.07 10*3/uL (ref 0.00–0.07)
Basophils Absolute: 0 10*3/uL (ref 0.0–0.1)
Basophils Relative: 0 %
Eosinophils Absolute: 0 10*3/uL (ref 0.0–0.5)
Eosinophils Relative: 0 %
HCT: 22.1 % — ABNORMAL LOW (ref 39.0–52.0)
Hemoglobin: 7.1 g/dL — ABNORMAL LOW (ref 13.0–17.0)
Immature Granulocytes: 1 %
Lymphocytes Relative: 4 %
Lymphs Abs: 0.4 10*3/uL — ABNORMAL LOW (ref 0.7–4.0)
MCH: 30.6 pg (ref 26.0–34.0)
MCHC: 32.1 g/dL (ref 30.0–36.0)
MCV: 95.3 fL (ref 80.0–100.0)
Monocytes Absolute: 0.3 10*3/uL (ref 0.1–1.0)
Monocytes Relative: 3 %
Neutro Abs: 9.5 10*3/uL — ABNORMAL HIGH (ref 1.7–7.7)
Neutrophils Relative %: 92 %
Platelets: 94 10*3/uL — ABNORMAL LOW (ref 150–400)
RBC: 2.32 MIL/uL — ABNORMAL LOW (ref 4.22–5.81)
RDW: 16.8 % — ABNORMAL HIGH (ref 11.5–15.5)
WBC: 10.2 10*3/uL (ref 4.0–10.5)
nRBC: 0 % (ref 0.0–0.2)

## 2018-09-09 LAB — URINE CULTURE: Culture: <10000 — AB

## 2018-09-09 LAB — D-DIMER, QUANTITATIVE: D-Dimer, Quant: 19.62 ug/mL-FEU — ABNORMAL HIGH (ref 0.00–0.50)

## 2018-09-09 LAB — COMPREHENSIVE METABOLIC PANEL
ALT: 39 U/L (ref 0–44)
AST: 79 U/L — ABNORMAL HIGH (ref 15–41)
Albumin: 2.3 g/dL — ABNORMAL LOW (ref 3.5–5.0)
Alkaline Phosphatase: 47 U/L (ref 38–126)
Anion gap: 9 (ref 5–15)
BUN: 38 mg/dL — ABNORMAL HIGH (ref 8–23)
CO2: 22 mmol/L (ref 22–32)
Calcium: 7.9 mg/dL — ABNORMAL LOW (ref 8.9–10.3)
Chloride: 106 mmol/L (ref 98–111)
Creatinine, Ser: 1.95 mg/dL — ABNORMAL HIGH (ref 0.61–1.24)
GFR calc Af Amer: 40 mL/min — ABNORMAL LOW (ref >60)
GFR calc non Af Amer: 35 mL/min — ABNORMAL LOW (ref >60)
Glucose, Bld: 129 mg/dL — ABNORMAL HIGH (ref 70–99)
Potassium: 3.2 mmol/L — ABNORMAL LOW (ref 3.5–5.1)
Sodium: 137 mmol/L (ref 135–145)
Total Bilirubin: 0.6 mg/dL (ref 0.3–1.2)
Total Protein: 5 g/dL — ABNORMAL LOW (ref 6.5–8.1)

## 2018-09-09 LAB — INFLUENZA PANEL BY PCR (TYPE A & B)
Influenza A By PCR: NEGATIVE
Influenza B By PCR: NEGATIVE

## 2018-09-09 LAB — TYPE AND SCREEN
ABO/RH(D): A POS
Antibody Screen: NEGATIVE

## 2018-09-09 LAB — NOVEL CORONAVIRUS, NAA (HOSP ORDER, SEND-OUT TO REF LAB; TAT 18-24 HRS): SARS-CoV-2, NAA: NOT DETECTED

## 2018-09-09 LAB — PREPARE RBC (CROSSMATCH)

## 2018-09-09 LAB — C-REACTIVE PROTEIN: CRP: 24.3 mg/dL — ABNORMAL HIGH (ref <1.0)

## 2018-09-09 MED ORDER — METOPROLOL TARTRATE 5 MG/5ML IV SOLN
5.0000 mg | INTRAVENOUS | Status: AC | PRN
  Administered 2018-09-09 (×2): 5 mg via INTRAVENOUS
  Filled 2018-09-09 (×2): qty 5

## 2018-09-09 MED ORDER — METOPROLOL TARTRATE 5 MG/5ML IV SOLN
5.0000 mg | Freq: Once | INTRAVENOUS | Status: AC
  Administered 2018-09-10: 5 mg via INTRAVENOUS
  Filled 2018-09-09: qty 5

## 2018-09-09 MED ORDER — METOPROLOL TARTRATE 25 MG PO TABS
25.0000 mg | ORAL_TABLET | Freq: Two times a day (BID) | ORAL | Status: DC
  Administered 2018-09-10 – 2018-09-11 (×4): 25 mg via ORAL
  Filled 2018-09-09 (×4): qty 1

## 2018-09-09 MED ORDER — FUROSEMIDE 10 MG/ML IJ SOLN
20.0000 mg | Freq: Once | INTRAMUSCULAR | Status: DC
  Filled 2018-09-09: qty 2

## 2018-09-09 MED ORDER — DARBEPOETIN ALFA 300 MCG/0.6ML IJ SOSY
300.0000 ug | PREFILLED_SYRINGE | Freq: Once | INTRAMUSCULAR | Status: DC
  Filled 2018-09-09: qty 0.6

## 2018-09-09 MED ORDER — CHLORHEXIDINE GLUCONATE CLOTH 2 % EX PADS
6.0000 | MEDICATED_PAD | Freq: Every day | CUTANEOUS | Status: DC
  Administered 2018-09-09 – 2018-09-16 (×8): 6 via TOPICAL

## 2018-09-09 MED ORDER — DARBEPOETIN ALFA 300 MCG/0.6ML IJ SOSY
300.0000 ug | PREFILLED_SYRINGE | Freq: Once | INTRAMUSCULAR | Status: AC
  Administered 2018-09-09: 300 ug via SUBCUTANEOUS
  Filled 2018-09-09: qty 0.6

## 2018-09-09 MED ORDER — DILTIAZEM HCL ER COATED BEADS 120 MG PO CP24
240.0000 mg | ORAL_CAPSULE | Freq: Every day | ORAL | Status: DC
  Administered 2018-09-09 – 2018-09-11 (×3): 240 mg via ORAL
  Filled 2018-09-09 (×3): qty 2

## 2018-09-09 MED ORDER — ORAL CARE MOUTH RINSE
15.0000 mL | Freq: Two times a day (BID) | OROMUCOSAL | Status: DC
  Administered 2018-09-09 – 2018-09-11 (×6): 15 mL via OROMUCOSAL

## 2018-09-09 MED ORDER — METOPROLOL TARTRATE 5 MG/5ML IV SOLN: 5.0000 mg | INTRAVENOUS | Status: DC | PRN

## 2018-09-09 MED ORDER — SODIUM CHLORIDE 0.9% IV SOLUTION: Freq: Once | INTRAVENOUS | Status: DC

## 2018-09-09 NOTE — Consult Note (Signed)
Referral MD  Reason for Referral: Shortness of breath/pneumonia; metastatic bladder cancer  Chief Complaint  Patient presents with  . Other    generalized weakness  : I just got weak and could not get up out of a chair.  HPI: Mr. Wainer is well-known to me.  Is a 68 year old white male.  He has metastatic bladder cancer.  He is on targeted therapy with a newer agent called Padcev.  He has had 3 cycles to date.  He had a recent PET scan which show that he has had a nice response.  There is no active disease.  He is due for his next cycle on June 1.  A couple days ago, he was feeling well.  He had no cough.  He ate well.  He had no diarrhea.  He does have occasional diarrhea, from the treatment.  He said that around midnight he just cannot get out of the chair.  He was very weak.  He was brought to the emergency room.  He is quite hypoxic.  A chest x-ray was done.  This showed diffuse interstitial and groundglass opacities.  It was felt this was a viral or atypical pneumonia.  His lab work showed a white count of 8.8.  Hemoglobin 7.8.  Platelet count 90,000.  Today, his white count is 10.2.  Hemoglobin 7.1 and platelet count 94,000.  On admission with his electrolytes, his sodium was 132.  Potassium 3.3.  BUN 40 creatinine 2.26.  Calcium 8.0.  SGPT 47 SGOT 66.  Total bilirubin 0.8.  His LDH was 488.  He had a C-reactive protein of 21.8.  Procalcitonin 1.72.  His electrolytes today show sodium 137 potassium 3.2.  BUN 38 creatinine 1.95.  He is still somewhat hypoxic.  He had no fever.  He has been checked once for the coronavirus.  This is been negative.  He has been checked again.  He has had no headache.  There is been no rashes.  Overall, his performance status has been quite good.  He has been fairly active.  Of note, he was hospitalized back in March for MSSA bacteremia.  He was neutropenic at the time.  At that time we stopped his actual chemotherapy and switch him over to  targeted therapy.     Past Medical History:  Diagnosis Date  . Arthritis   . 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 ([place]) 09/29/2016  . Bladder tumor   . Essential hypertension 06/23/2018  . Goals of care, counseling/discussion 09/30/2016  . History of prostate cancer followed by pcp dr [name]-  per pt last PSA undetectable   dx 2008-- (Stage T1c, Gleason 3+3,  PSA 4.58, vol 99cc)  s/p  radical prostatectomy (nerve sparing bilateral)   . Hyperglycemia 06/23/2018  . Hypertension   . Lower urinary tract symptoms (LUTS)   . Mild hyperlipidemia 06/23/2018  . Pre-diabetes   . Wears glasses   :  Past Surgical History:  Procedure Laterality Date  . CATARACT EXTRACTION W/ INTRAOCULAR LENS  IMPLANT, BILATERAL Bilateral 2011  . [place]  . IR FLUORO GUIDE PORT INSERTION RIGHT  10/07/2016  . IR RADIOLOGIST EVAL & MGMT  01/05/2018  . IR US GUIDE VASC ACCESS RIGHT  10/07/2016  . KNEE ARTHROSCOPY Bilateral right 2006;  left 02-15-2007  . [place];  1990;  1983  . ROBOT ASSISTED LAPAROSCOPIC RADICAL PROSTATECTOMY  06/20/2006   bilateral nerve sparing  . TEE WITHOUT CARDIOVERSION N/A 06/27/2018  Procedure: TRANSESOPHAGEAL ECHOCARDIOGRAM (TEE);  Surgeon: Nigel Mormon, MD;  Location: [facility] ENDOSCOPY;  Service: Cardiovascular;  Laterality: N/A;  . TOTAL HIP ARTHROPLASTY Left 03/03/2015   Procedure: LEFT TOTAL HIP ARTHROPLASTY ANTERIOR APPROACH;  Surgeon: Dorna Leitz, MD;  Location: [place];  Service: Orthopedics;  Laterality: Left;  . TOTAL KNEE ARTHROPLASTY Bilateral left 08-13-2009;  right 12-26-2009  . TRANSURETHRAL RESECTION OF BLADDER TUMOR N/A 09/20/2016   Procedure: TRANSURETHRAL RESECTION OF BLADDER TUMOR (TURBT);  Surgeon: Franchot Gallo, MD;  Location: [facility];  Service: Urology;  Laterality: N/A;  :   Current Facility-Administered Medications:  .  acetaminophen (TYLENOL) tablet 650 mg, 650 mg, Oral, Q6H PRN  **OR** acetaminophen (TYLENOL) suppository 650 mg, 650 mg, Rectal, Q6H PRN, Mariel Aloe, MD, 650 mg at 09/09/18 0030 .  ceFEPIme (MAXIPIME) 2 g in sodium chloride 0.9 % 100 mL IVPB, 2 g, Intravenous, Q12H, Polly Cobia, RPH, Last Rate: 200 mL/hr at 09/09/18 0614, 2 g at 09/09/18 4010 .  diltiazem (CARDIZEM CD) 24 hr capsule 240 mg, 240 mg, Oral, Daily, Blount, Xenia T, NP .  gabapentin (NEURONTIN) capsule 300 mg, 300 mg, Oral, TID, Mariel Aloe, MD, 300 mg at 09/08/18 2200 .  LORazepam (ATIVAN) tablet 0.5 mg, 0.5 mg, Oral, Q6H PRN, Mariel Aloe, MD .  magnesium oxide (MAG-OX) tablet 400 mg, 400 mg, Oral, Daily, Mariel Aloe, MD, 400 mg at 09/08/18 1839 .  metoprolol tartrate (LOPRESSOR) injection 5 mg, 5 mg, Intravenous, Q5 min PRN, Blount, Xenia T, NP, 5 mg at 09/09/18 0029 .  [START ON 09/10/2018] metoprolol tartrate (LOPRESSOR) tablet 25 mg, 25 mg, Oral, BID, Blount, Xenia T, NP .  ondansetron (ZOFRAN) tablet 4 mg, 4 mg, Oral, Q6H PRN **OR** ondansetron (ZOFRAN) injection 4 mg, 4 mg, Intravenous, Q6H PRN, Mariel Aloe, MD .  pyridOXINE (VITAMIN B-6) tablet 300 mg, 300 mg, Oral, Daily, Mariel Aloe, MD, 300 mg at 09/08/18 1839 .  senna (SENOKOT) tablet 8.6 mg, 1 tablet, Oral, BID, Nettey, Evalee Jefferson, MD  Facility-Administered Medications Ordered in Other Encounters:  .  sodium chloride flush (NS) 0.9 % injection 10 mL, 10 mL, Intravenous, PRN, [place], Sarah M, NP, 10 mL at 02/21/17 1038 .  sodium chloride flush (NS) 0.9 % injection 10 mL, 10 mL, Intravenous, PRN, Volanda Napoleon, MD, 10 mL at 07/10/18 0844:  . diltiazem  240 mg Oral Daily  . gabapentin  300 mg Oral TID  . magnesium oxide  400 mg Oral Daily  . [START ON 09/10/2018] metoprolol tartrate  25 mg Oral BID  . vitamin B-6  300 mg Oral Daily  . senna  1 tablet Oral BID  :  No Known Allergies:  Family History  Problem Relation Age of Onset  . Hypertension Mother   . Aneurysm Mother   . Emphysema Father    . Hypertension Father   :  Social History   Socioeconomic History  . Marital status: Married    Spouse name: Not on file  . Number of children: Not on file  . Years of education: Not on file  . Highest education level: Not on file  Occupational History  . Not on file  Social Needs  . Financial resource strain: Not on file  . Food insecurity:    Worry: Not on file    Inability: Not on file  . Transportation needs:    Medical: Not on file    Non-medical: Not on file  Tobacco Use  . Smoking status: Former Smoker    Years: 16.00    Types: Cigarettes    Last attempt to quit: 09/25/1984    Years since quitting: 33.9  . Smokeless tobacco: Never Used  Substance and Sexual Activity  . Alcohol use: Yes    Comment: occasionally  . Drug use: No  . Sexual activity: Not on file  Lifestyle  . Physical activity:    Days per week: Not on file    Minutes per session: Not on file  . Stress: Not on file  Relationships  . Social connections:    Talks on phone: Not on file    Gets together: Not on file    Attends religious service: Not on file    Active member of club or organization: Not on file    Attends meetings of clubs or organizations: Not on file    Relationship status: Not on file  . Intimate partner violence:    Fear of current or ex partner: Not on file    Emotionally abused: Not on file    Physically abused: Not on file    Forced sexual activity: Not on file  Other Topics Concern  . Not on file  Social History Narrative  . Not on file  :  Review of Systems  Constitutional: Positive for malaise/fatigue.  HENT: Negative.   Eyes: Negative.   Respiratory: Positive for shortness of breath.   Cardiovascular: Positive for palpitations.  Gastrointestinal: Positive for diarrhea.  Genitourinary: Negative.   Musculoskeletal: Positive for back pain.  Skin: Negative.   Neurological: Negative.   Endo/Heme/Allergies: Negative.   Psychiatric/Behavioral: Negative.       Exam: As above Patient Vitals for the past 24 hrs:  BP Temp Temp src Pulse Resp SpO2  09/09/18 0600 106/63 - - 93 (!) 21 100 %  09/09/18 0500 109/69 - - 95 17 100 %  09/09/18 0400 98/69 98.5 F (36.9 C) Axillary (!) 102 17 99 %  09/09/18 0300 112/61 - - (!) 112 (!) 31 99 %  09/09/18 0100 (!) 107/92 - - (!) 135 (!) 35 94 %  09/09/18 0037 117/61 - - (!) 119 (!) 31 99 %  09/09/18 0036 - - - (!) 124 (!) 36 99 %  09/09/18 0035 129/68 - - (!) 132 (!) 42 99 %  09/09/18 0033 138/80 - - (!) 137 (!) 38 98 %  09/09/18 0032 - - - (!) 145 (!) 31 98 %  09/09/18 0031 - - - (!) 146 (!) 41 100 %  09/09/18 0030 128/70 - - (!) 146 (!) 37 97 %  09/09/18 0024 (!) 155/114 (!) 101.5 F (38.6 C) Rectal (!) 163 (!) 49 93 %  09/09/18 0023 (!) 165/147 - - (!) 158 (!) 46 93 %  09/09/18 0022 (!) 165/147 - - (!) 182 (!) 38 92 %  09/09/18 0000 (!) 153/120 - - (!) 44 (!) 41 94 %  09/08/18 2300 120/74 - - 92 (!) 22 98 %  09/08/18 2200 (!) 119/93 - - 90 (!) 33 100 %  09/08/18 2100 (!) 128/57 - - (!) 57 (!) 26 (!) 68 %  09/08/18 1900 - - - 89 17 91 %  09/08/18 1800 124/77 - - 75 (!) 28 100 %  09/08/18 1700 - - - 81 (!) 23 96 %  09/08/18 1600 113/68 98.9 F (37.2 C) Oral 71 (!) 24 93 %  09/08/18 1500 103/64 - - 69 16 100 %  09/08/18 1400 107/66 - - 73 (!) 21 100 %  09/08/18 1300 (!) 97/54 - - 74 16 97 %  09/08/18 1200 105/71 98.8 F (37.1 C) Oral 78 (!) 26 98 %  09/08/18 1100 107/72 - - 86 (!) 26 (!) 86 %  09/08/18 1000 106/62 - - 73 19 99 %  09/08/18 0800 104/70 - - 81 (!) 26 99 %     Recent Labs    09/08/18 0255 09/09/18 0410  WBC 8.8 10.2  HGB 7.8* 7.1*  HCT 23.0* 22.1*  PLT 90* 94*   Recent Labs    09/08/18 0255 09/08/18 0452 09/09/18 0410  NA 132*  --  137  K 3.3*  --  3.2*  CL 99  --  106  CO2 22  --  22  GLUCOSE 194*  --  129*  BUN 40*  --  38*  CREATININE 2.26* 2.30* 1.95*  CALCIUM 8.0*  --  7.9*    Blood smear review: None  Pathology: None    Assessment and Plan:  Mr. Mcniel is a nice 68 year old white male with metastatic bladder cancer.  He is doing quite well with respect to this.  He is in remission by his PET scan.  The Padcev has really helped a really has had very little toxicity from it.  I am not sure why he has the pulmonary issues.  Maybe this is some type of atypical pneumonia.  Hopefully, his second coronavirus test will be negative.  I think he clearly needs to have a blood transfusion.  His hemoglobin is only 7.1.  I do not think he is bleeding.  He probably does have a blunted erythropoietic response as his erythropoietin level back in March was 33.  I probably would also check him for blood clots.  I think a pulmonary embolism is a possibility.  We cannot do a CT angiogram because of his renal function.  I do not think a VQ scan would be helpful.  Maybe a d-dimer could be helpful.  I think checking his legs for blood clots would be reasonable.  I just feel bad that he is in the hospital.  Again he has done quite well from the cancer point of view.  I note that he is getting great care from all the staff down in the ICU.  We will follow along and try to help out as much as possible.  [name], MD  [place] 3:19

## 2018-09-09 NOTE — Progress Notes (Signed)
PROGRESS NOTE    [name] Rose  SFK:[id number] DOB: [birth year] DOA: 09/08/2018 PCP: Shirline Frees, MD  Brief Narrative:68 y.o. male with medical history significant of hypertension, hyperlipidemia, prediabetes, prostate cancer, metastasized bladder cancer on chemotherapy, PAF, CKD stage III, MSSA bacteremia.  Patient presented secondary to significant weakness overnight.  He reports no associated symptoms.  Weakness occurred acutely as he felt normal the day prior.  He reports receiving chemotherapy on a 2-week on 1 week off regimen.  Last round of chemotherapy was just over 1-1/2 weeks ago.  Patient reports not generally feeling this week with chemotherapy.  Weakness has improved slightly since coming to the hospital.  ED Course: Vitals: Afebrile, normal pulse, respirations in the mid 20s, initial blood pressure of 80s over 50s which has improved to about 100/70 patient with an SPO2 mid to high 90s on nonrebreather Labs: Sodium 132, potassium 3.3, glucose of 194, BUN of 40 and creatinine of 2.26, AST/ALT of 66 and 47 respectively, troponin of 0.03, lactic acid of 1.1, WBC of 8.8k with absolute lymphocyte count of 500 Imaging: Chest x-ray significant for diffuse interstitial/groundglass opacities concerning for atypical/viral pneumonia Medications/Course: 4 L NS bolus, vancomycin, cefepime  09/09/2018-patient reports that he feels terrible cannot breathe more short of breath he has been on cefepime for H CAP.  Blood transfusion was ordered but blood bank refused to give it due to his hemoglobin being about 7.  Assessment & Plan:   Principal Problem:   Pneumonia Active Problems:   LFT elevation   AKI (acute kidney injury) ([place])   CKD (chronic kidney disease), stage III ([facility])   #1 acute hypoxic respiratory failure patient complaining of extreme shortness of breath on 15 L of HFNC.  At rest he is saturation is low 90s on high flow nasal cannula with any exertion he desaturates and needs  extra nonrebreather mask to recover.  At baseline he is placed on 15 L of high flow nasal cannula.  Patient had an episode of coughing spell overnight and desaturated.  Chest x-ray shows diffuse groundglass opacities bilaterally.  He is being covered for H CAP and cefepime.  Vancomycin was stopped due to MRSA PCR negative.  I will add a flu panel and a VQ scan and venous Doppler of his lower extremities.  Patient received 1 dose of Lasix in the ER.  His first COVID was negative because of his high risk and immunosuppression and atypical presentation with diffuse groundglass opacities co-test was repeated as a send out.  COVID repeat test pending.  Continue airborne precautions.  Patient was seen with N 95 gloves gown goggles and hair net.  He is not on any covid  treatment at this time as the test is still pending and the first test was negative.  Follow-up blood culture.  Discussed with PCCM formal consult not placed.    #2 AKI on CKD stage III creatinine improved with IV fluids creatinine 1.9 his baseline is anywhere from 1.7-1.8 continue close monitoring.  Follow-up labs in the morning.    #3 acute on chronic anemia of chronic disease hemoglobin 7.1 today blood transfusion ordered but blood bank refused to give him transfusion unless his hemoglobin is less than 7.1 due to shortage of blood due to COVID.  #4 paroxysmal atrial fibrillation patient takes diltiazem and metoprolol at home which is been on hold due to soft blood pressure currently he is rate controlled once blood pressure improves restart these meds.  #5 metastatic bladder cancer with prostate  cancer currently on chemotherapy by [name].  Patient had recent PET scan that showed good response and that he is in remission.  #6 hypertension hold BP meds since blood pressure is soft  DVT prophylaxis: SCDs Code Status: Full code Family Communication:  dw wife melanie Disposition Plan: Stepdown unit Consults called:onc    Estimated  body mass index is 32.1 kg/m as calculated from the following:   Height as of this encounter: $RemoveBeforeD'6\' 2"'[id number]$  (1.88 m).   Weight as of this encounter: 113.4 kg.   Subjective: He feels he is getting worse getting more short of breath anxious  Objective: Vitals:   09/09/18 0500 09/09/18 0600 09/09/18 0700 09/09/18 0800  BP: 109/69 106/63 132/60 109/75  Pulse: 95 93 97 89  Resp: 17 (!) 21 (!) 25 18  Temp:      TempSrc:      SpO2: 100% 100% 100% 99%  Weight:      Height:        Intake/Output Summary (Last 24 hours) at 09/09/2018 0923 Last data filed at 09/09/2018 0600 Gross per 24 hour  Intake 100 ml  Output 1525 ml  Net -1425 ml   Filed Weights   09/08/18 0545  Weight: 113.4 kg    Examination:  General exam: Appears calm and comfortable  Respiratory system: scattered rhonchi to auscultation. Respiratory effort normal. Cardiovascular system: S1 & S2 heard, RRR. No JVD, murmurs, rubs, gallops or clicks. No pedal edema. Gastrointestinal system: Abdomen is nondistended, soft and nontender. No organomegaly or masses felt. Normal bowel sounds heard. Central nervous system: Alert and oriented. No focal neurological deficits. Extremities: Symmetric 5 x 5 power. Skin: No rashes, lesions or ulcers Psychiatry: Judgement and insight appear normal. Mood & affect appropriate.     Data Reviewed: I have personally reviewed following labs and imaging studies  CBC: Recent Labs  Lab 09/08/18 0255 09/09/18 0410  WBC 8.8 10.2  NEUTROABS 7.8* 9.5*  HGB 7.8* 7.1*  HCT 23.0* 22.1*  MCV 93.5 95.3  PLT 90* 94*   Basic Metabolic Panel: Recent Labs  Lab 09/08/18 0255 09/08/18 0312 09/08/18 0452 09/09/18 0410  NA 132*  --   --  137  K 3.3*  --   --  3.2*  CL 99  --   --  106  CO2 22  --   --  22  GLUCOSE 194*  --   --  129*  BUN 40*  --   --  38*  CREATININE 2.26*  --  2.30* 1.95*  CALCIUM 8.0*  --   --  7.9*  MG  --  1.7  --   --    GFR: Estimated Creatinine Clearance: 49.2 mL/min  (A) (by C-G formula based on SCr of 1.95 mg/dL (H)). Liver Function Tests: Recent Labs  Lab 09/08/18 0255 09/09/18 0410  AST 66* 79*  ALT 47* 39  ALKPHOS 45 47  BILITOT 0.8 0.6  PROT 5.2* 5.0*  ALBUMIN 2.5* 2.3*   No results for input(s): LIPASE, AMYLASE in the last 168 hours. No results for input(s): AMMONIA in the last 168 hours. Coagulation Profile: No results for input(s): INR, PROTIME in the last 168 hours. Cardiac Enzymes: Recent Labs  Lab 09/08/18 0255 09/08/18 0312  CKTOTAL 9*  --   TROPONINI  --  0.03*   BNP (last 3 results) No results for input(s): PROBNP in the last 8760 hours. HbA1C: No results for input(s): HGBA1C in the last 72 hours. CBG: Recent Labs  Lab 09/05/18 0704 09/05/18 0841  GLUCAP 171* 171*   Lipid Profile: Recent Labs    09/08/18 1047  TRIG 94   Thyroid Function Tests: No results for input(s): TSH, T4TOTAL, FREET4, T3FREE, THYROIDAB in the last 72 hours. Anemia Panel: Recent Labs    09/08/18 1047  FERRITIN 3,307*   Sepsis Labs: Recent Labs  Lab 09/08/18 0358 09/08/18 1047  PROCALCITON  --  1.72  LATICACIDVEN 1.1  --     Recent Results (from the past 240 hour(s))  SARS Coronavirus 2 (CEPHEID - Performed in [facility] hospital lab), Hosp Order     Status: None   Collection Time: 09/08/18  3:58 AM  Result Value Ref Range Status   SARS Coronavirus 2 NEGATIVE NEGATIVE Final    Comment: (NOTE) If result is NEGATIVE SARS-CoV-2 target nucleic acids are NOT DETECTED. The SARS-CoV-2 RNA is generally detectable in upper and lower  respiratory specimens during the acute phase of infection. The lowest  concentration of SARS-CoV-2 viral copies this assay can detect is 250  copies / mL. A negative result does not preclude SARS-CoV-2 infection  and should not be used as the sole basis for treatment or other  patient management decisions.  A negative result may occur with  improper specimen collection / handling, submission of  specimen other  than nasopharyngeal swab, presence of viral mutation(s) within the  areas targeted by this assay, and inadequate number of viral copies  (<250 copies / mL). A negative result must be combined with clinical  observations, patient history, and epidemiological information. If result is POSITIVE SARS-CoV-2 target nucleic acids are DETECTED. The SARS-CoV-2 RNA is generally detectable in upper and lower  respiratory specimens dur ing the acute phase of infection.  Positive  results are indicative of active infection with SARS-CoV-2.  Clinical  correlation with patient history and other diagnostic information is  necessary to determine patient infection status.  Positive results do  not rule out bacterial infection or co-infection with other viruses. If result is PRESUMPTIVE POSTIVE SARS-CoV-2 nucleic acids MAY BE PRESENT.   A presumptive positive result was obtained on the submitted specimen  and confirmed on repeat testing.  While 2019 novel coronavirus  (SARS-CoV-2) nucleic acids may be present in the submitted sample  additional confirmatory testing may be necessary for epidemiological  and / or clinical management purposes  to differentiate between  SARS-CoV-2 and other Sarbecovirus currently known to infect humans.  If clinically indicated additional testing with an alternate test  methodology [phone]) is advised. The SARS-CoV-2 RNA is generally  detectable in upper and lower respiratory sp ecimens during the acute  phase of infection. The expected result is Negative. Fact Sheet for Patients:  StrictlyIdeas.no Fact Sheet for Healthcare Providers: BankingDealers.co.za This test is not yet approved or cleared by the [place] FDA and has been authorized for detection and/or diagnosis of SARS-CoV-2 by FDA under an Emergency Use Authorization (EUA).  This EUA will remain in effect (meaning this test can be used) for the  duration of the COVID-19 declaration under Section 564(b)(1) of the Act, 21 U.S.C. section 360bbb-3(b)(1), unless the authorization is terminated or revoked sooner. Performed at [facility] [place] [address], [place], [place] [address]   MRSA PCR Screening     Status: None   Collection Time: 09/08/18  8:52 AM  Result Value Ref Range Status   MRSA by PCR NEGATIVE NEGATIVE Final    Comment:        The  GeneXpert MRSA Assay (FDA approved for NASAL specimens only), is one component of a comprehensive MRSA colonization surveillance program. It is not intended to diagnose MRSA infection nor to guide or monitor treatment for MRSA infections. Performed at [facility], [place] [address], [place], [place] [address]          Radiology Studies: Dg Chest Port 1 View  Result Date: 09/08/2018 CLINICAL DATA:  Sepsis EXAM: PORTABLE CHEST 1 VIEW COMPARISON:  06/23/2018, PET CT 09/05/2018 FINDINGS: Right-sided central venous port tip over the SVC. Stable mild cardiomegaly. Mild diffuse interstitial and ground-glass opacity. No consolidation or effusion. No pneumothorax. IMPRESSION: 1. Mild diffuse interstitial and ground-glass opacity, possible atypical or viral pneumonia 2. Mild cardiomegaly Electronically Signed   By: Donavan Foil M.D.   On: 09/08/2018 03:22        Scheduled Meds: . sodium chloride   Intravenous Once  . darbepoetin (ARANESP) injection - NON-DIALYSIS  300 mcg Subcutaneous Once  . diltiazem  240 mg Oral Daily  . furosemide  20 mg Intravenous Once  . gabapentin  300 mg Oral TID  . magnesium oxide  400 mg Oral Daily  . [START ON 09/10/2018] metoprolol tartrate  25 mg Oral BID  . vitamin B-6  300 mg Oral Daily  . senna  1 tablet Oral BID   Continuous Infusions: . ceFEPime (MAXIPIME) IV 2 g (09/09/18 4497)     LOS: 1 day     Georgette Shell, MD [facility]  If 7PM-7AM, please contact night-coverage [email or web address]  Password [email or web address] 09/09/2018, 9:23 AM

## 2018-09-09 NOTE — Progress Notes (Signed)
Lower extremity venous has been completed.   Preliminary results in CV Proc.   [name] Rose 09/09/2018 5:03 PM

## 2018-09-10 ENCOUNTER — Inpatient Hospital Stay (HOSPITAL_COMMUNITY): Payer: PPO

## 2018-09-10 DIAGNOSIS — Z79899 Other long term (current) drug therapy: Secondary | ICD-10-CM

## 2018-09-10 DIAGNOSIS — Z8619 Personal history of other infectious and parasitic diseases: Secondary | ICD-10-CM

## 2018-09-10 DIAGNOSIS — Z87891 Personal history of nicotine dependence: Secondary | ICD-10-CM

## 2018-09-10 DIAGNOSIS — C679 Malignant neoplasm of bladder, unspecified: Secondary | ICD-10-CM

## 2018-09-10 DIAGNOSIS — C772 Secondary and unspecified malignant neoplasm of intra-abdominal lymph nodes: Secondary | ICD-10-CM

## 2018-09-10 DIAGNOSIS — J9601 Acute respiratory failure with hypoxia: Secondary | ICD-10-CM

## 2018-09-10 DIAGNOSIS — N183 Chronic kidney disease, stage 3 (moderate): Secondary | ICD-10-CM

## 2018-09-10 DIAGNOSIS — Z95828 Presence of other vascular implants and grafts: Secondary | ICD-10-CM

## 2018-09-10 DIAGNOSIS — J189 Pneumonia, unspecified organism: Secondary | ICD-10-CM

## 2018-09-10 LAB — CBC WITH DIFFERENTIAL/PLATELET
Abs Immature Granulocytes: 0.05 10*3/uL (ref 0.00–0.07)
Basophils Absolute: 0 10*3/uL (ref 0.0–0.1)
Basophils Relative: 0 %
Eosinophils Absolute: 0.2 10*3/uL (ref 0.0–0.5)
Eosinophils Relative: 2 %
HCT: 22 % — ABNORMAL LOW (ref 39.0–52.0)
Hemoglobin: 7 g/dL — ABNORMAL LOW (ref 13.0–17.0)
Immature Granulocytes: 1 %
Lymphocytes Relative: 7 %
Lymphs Abs: 0.5 10*3/uL — ABNORMAL LOW (ref 0.7–4.0)
MCH: 30.6 pg (ref 26.0–34.0)
MCHC: 31.8 g/dL (ref 30.0–36.0)
MCV: 96.1 fL (ref 80.0–100.0)
Monocytes Absolute: 0.2 10*3/uL (ref 0.1–1.0)
Monocytes Relative: 3 %
Neutro Abs: 6 10*3/uL (ref 1.7–7.7)
Neutrophils Relative %: 87 %
Platelets: 91 10*3/uL — ABNORMAL LOW (ref 150–400)
RBC: 2.29 MIL/uL — ABNORMAL LOW (ref 4.22–5.81)
RDW: 16.9 % — ABNORMAL HIGH (ref 11.5–15.5)
WBC: 7 10*3/uL (ref 4.0–10.5)
nRBC: 0 % (ref 0.0–0.2)

## 2018-09-10 LAB — COMPREHENSIVE METABOLIC PANEL
ALT: 36 U/L (ref 0–44)
AST: 64 U/L — ABNORMAL HIGH (ref 15–41)
Albumin: 2.1 g/dL — ABNORMAL LOW (ref 3.5–5.0)
Alkaline Phosphatase: 52 U/L (ref 38–126)
Anion gap: 10 (ref 5–15)
BUN: 36 mg/dL — ABNORMAL HIGH (ref 8–23)
CO2: 22 mmol/L (ref 22–32)
Calcium: 8.1 mg/dL — ABNORMAL LOW (ref 8.9–10.3)
Chloride: 104 mmol/L (ref 98–111)
Creatinine, Ser: 1.74 mg/dL — ABNORMAL HIGH (ref 0.61–1.24)
GFR calc Af Amer: 46 mL/min — ABNORMAL LOW (ref >60)
GFR calc non Af Amer: 40 mL/min — ABNORMAL LOW (ref >60)
Glucose, Bld: 125 mg/dL — ABNORMAL HIGH (ref 70–99)
Potassium: 3.1 mmol/L — ABNORMAL LOW (ref 3.5–5.1)
Sodium: 136 mmol/L (ref 135–145)
Total Bilirubin: 0.8 mg/dL (ref 0.3–1.2)
Total Protein: 4.8 g/dL — ABNORMAL LOW (ref 6.5–8.1)

## 2018-09-10 LAB — PREPARE RBC (CROSSMATCH)

## 2018-09-10 LAB — BLOOD GAS, ARTERIAL
Acid-base deficit: 0.9 mmol/L (ref 0.0–2.0)
Bicarbonate: 22.5 mmol/L (ref 20.0–28.0)
O2 Content: 19.5 L/min
O2 Saturation: 85.7 %
Patient temperature: 98.6
pCO2 arterial: 34.5 mmHg (ref 32.0–48.0)
pH, Arterial: 7.431 (ref 7.350–7.450)
pO2, Arterial: 51.7 mmHg — ABNORMAL LOW (ref 83.0–108.0)

## 2018-09-10 LAB — HEPARIN LEVEL (UNFRACTIONATED): Heparin Unfractionated: 0.27 IU/mL — ABNORMAL LOW (ref 0.30–0.70)

## 2018-09-10 MED ORDER — HEPARIN (PORCINE) 25000 UT/250ML-% IV SOLN
1700.0000 [IU]/h | INTRAVENOUS | Status: DC
  Administered 2018-09-10: 1700 [IU]/h via INTRAVENOUS
  Filled 2018-09-10: qty 250

## 2018-09-10 MED ORDER — PHENOL 1.4 % MT LIQD
1.0000 | OROMUCOSAL | Status: DC | PRN
  Filled 2018-09-10: qty 177

## 2018-09-10 MED ORDER — POTASSIUM CHLORIDE CRYS ER 20 MEQ PO TBCR
40.0000 meq | EXTENDED_RELEASE_TABLET | Freq: Two times a day (BID) | ORAL | Status: DC
  Administered 2018-09-10 – 2018-09-11 (×4): 40 meq via ORAL
  Filled 2018-09-10 (×4): qty 2

## 2018-09-10 MED ORDER — SODIUM CHLORIDE 0.9 % IV SOLN
500.0000 mg | INTRAVENOUS | Status: DC
  Administered 2018-09-10 – 2018-09-15 (×6): 500 mg via INTRAVENOUS
  Filled 2018-09-10 (×6): qty 500

## 2018-09-10 MED ORDER — METOPROLOL TARTRATE 5 MG/5ML IV SOLN: 5.0000 mg | INTRAVENOUS | Status: DC | PRN

## 2018-09-10 MED ORDER — ALBUTEROL SULFATE (2.5 MG/3ML) 0.083% IN NEBU
3.0000 mL | INHALATION_SOLUTION | RESPIRATORY_TRACT | Status: DC | PRN
  Administered 2018-09-20: 3 mL via RESPIRATORY_TRACT
  Filled 2018-09-10: qty 3

## 2018-09-10 MED ORDER — HEPARIN (PORCINE) 25000 UT/250ML-% IV SOLN
1700.0000 [IU]/h | INTRAVENOUS | Status: DC
  Administered 2018-09-10 – 2018-09-11 (×3): 1800 [IU]/h via INTRAVENOUS
  Administered 2018-09-12 (×2): 1700 [IU]/h via INTRAVENOUS
  Administered 2018-09-12: 05:00:00 1800 [IU]/h via INTRAVENOUS
  Administered 2018-09-13: 1700 [IU]/h via INTRAVENOUS
  Filled 2018-09-10 (×5): qty 250

## 2018-09-10 MED ORDER — MORPHINE SULFATE (PF) 2 MG/ML IV SOLN
2.0000 mg | Freq: Once | INTRAVENOUS | Status: AC
  Administered 2018-09-10: 2 mg via INTRAVENOUS
  Filled 2018-09-10: qty 1

## 2018-09-10 MED ORDER — METHYLPREDNISOLONE SODIUM SUCC 125 MG IJ SOLR
80.0000 mg | Freq: Four times a day (QID) | INTRAMUSCULAR | Status: DC
  Administered 2018-09-10 – 2018-09-15 (×21): 80 mg via INTRAVENOUS
  Filled 2018-09-10 (×22): qty 2

## 2018-09-10 MED ORDER — HEPARIN BOLUS VIA INFUSION
3000.0000 [IU] | Freq: Once | INTRAVENOUS | Status: AC
  Administered 2018-09-10: 3000 [IU] via INTRAVENOUS
  Filled 2018-09-10: qty 3000

## 2018-09-10 MED ORDER — SODIUM CHLORIDE 0.9% IV SOLUTION
Freq: Once | INTRAVENOUS | Status: AC
  Administered 2018-09-10: 13:00:00 via INTRAVENOUS

## 2018-09-10 NOTE — Progress Notes (Signed)
Pt.s settings increased while sleeping to maintain saturations of 88% or greater.  Bipap not indicated at this time.  Will continue to monitor.

## 2018-09-10 NOTE — Progress Notes (Signed)
ANTICOAGULATION CONSULT NOTE - Initial Consult  Pharmacy Consult for IV heparin Indication: presumed PE  No Known Allergies  Patient Measurements: Height: 6\' 2"  (188 cm) Weight: 250 lb (113.4 kg) IBW/kg (Calculated) : 82.2 Heparin Dosing Weight: 105.8  Vital Signs: Temp: 97.7 F (36.5 C) (05/31 0800) Temp Source: Oral (05/31 0800) BP: 91/58 (05/31 0700) Pulse Rate: 106 (05/31 0700)  Labs: Recent Labs    09/08/18 0255 09/08/18 0312 09/08/18 0452 09/09/18 0410 09/10/18 0240  HGB 7.8*  --   --  7.1* 7.0*  HCT 23.0*  --   --  22.1* 22.0*  PLT 90*  --   --  94* 91*  CREATININE 2.26*  --  2.30* 1.95* 1.74*  CKTOTAL 9*  --   --   --   --   TROPONINI  --  0.03*  --   --   --     Estimated Creatinine Clearance: 55.2 mL/min (A) (by C-G formula based on SCr of 1.74 mg/dL (H)).   Medical History: Past Medical History:  Diagnosis Date  . Arthritis   . 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 ([place]) 09/29/2016  . Bladder tumor   . Essential hypertension 06/23/2018  . Goals of care, counseling/discussion 09/30/2016  . History of prostate cancer followed by pcp dr [name]-  per pt last PSA undetectable   dx 2008-- (Stage T1c, Gleason 3+3,  PSA 4.58, vol 99cc)  s/p  radical prostatectomy (nerve sparing bilateral)   . Hyperglycemia 06/23/2018  . Hypertension   . Lower urinary tract symptoms (LUTS)   . Mild hyperlipidemia 06/23/2018  . Pre-diabetes   . Wears glasses     Medications:  Scheduled:  . sodium chloride   Intravenous Once  . sodium chloride   Intravenous Once  . Chlorhexidine Gluconate Cloth  6 each Topical Daily  . diltiazem  240 mg Oral Daily  . furosemide  20 mg Intravenous Once  . gabapentin  300 mg Oral TID  . magnesium oxide  400 mg Oral Daily  . mouth rinse  15 mL Mouth Rinse BID  . metoprolol tartrate  25 mg Oral BID  . potassium chloride  40 mEq Oral BID  . vitamin B-6  300 mg Oral Daily  . senna  1 tablet Oral BID   Infusions:  .  ceFEPime (MAXIPIME) IV Stopped (09/10/18 7782)    Assessment: 68 yo with HAP, metastatic bladder cancer on chemo with last cycle completed on 5/18. Now to start IV heparin per Md orders for presumed PE to confirm via CT. Hgb low at 7 but stable since admission. Plts also low at 91k but stable since admission. SCr 1.74 which is trending down est CrCl 55 ml/min. No anticoagulant prescribed PTA  Goal of Therapy:  Heparin level 0.3-0.7 units/ml Monitor platelets by anticoagulation protocol: Yes   Plan:  1) 3000 unit IV heparin loading dose then 2) 1700 units/hr IV heparin rate 3) Check heparin level 6 hours after heparin load/infusion start 4) Daily CBC and heparin level 5) Follow up CT results and d/c IV UFH if negative for PE   Adrian Saran, PharmD, BCPS 09/10/2018 9:39 AM

## 2018-09-10 NOTE — Progress Notes (Signed)
Came to evaluate patient.  Patient continues to need high flow oxygen currently remains on 100% nonrebreather.  His blood pressure soft.  Heart rate better controlled now. CT scan of the chest was done earlier that was reviewed that shows diffuse interstitial fluid pattern suspect ARDS. Had a strong suspicion of COVID on this patient, COVID-19 x2 was negative.  I had also informed ID about it.  Plan: Continue antibiotics, continue heparin for presumed PE, will add IV steroids Blood gas analysis showed hypoxia and normal pH. Patient with increasing oxygen requirement, may need intubation and mechanical ventilation, explained to the patient and he is agreeable to mechanical intubation if needed.  Consult critical care. I called and updated patient's wife about his condition.

## 2018-09-10 NOTE — Consult Note (Signed)
NAME:  [name] Rose, MRN:  [id number], DOB:  1950-07-07, LOS: 2 ADMISSION DATE:  09/08/2018, CONSULTATION DATE:  [id number] REFERRING MD:  Dr. Sloan Leiter, [facility], CHIEF COMPLAINT:  Short of breath   Brief History   68 yo male former smoker presented to ER with weakness, hypoxia, fever 101.5.  He has hx of metastatic bladder cancer on targeted therapy with Padcev x 3 cycles.  CXR showed pulmonary infiltrates.  COVID testing negative x 2.  He had progressive hypoxia and PCCM asked to assess.  History of present illness   He denies travel history or sick exposures.  No animal/bird exposures.  No new medications except immunotherapy for bladder cancer.  PET scan from 09/05/18 was relatively unremarkable.    He has dry cough occasionally.  No chest pain, hemoptysis, joint swelling, or sinus congestion.  Has persistent faint rash on arms since being on Padcev.  Had intermittent diarrhea.  No nausea or vomiting.  Gets winded with any movement, but feels he can keep up with his breathing when sitting still.  Past Medical History  HTN, HLD, Prostate cancer, PAF, CKD 3, MSSA Bacteremia March 2020, chemotherapy induced neutropenia, moderate mitral regurgitation  Significant Hospital Events   5/29 Admit 5/31 solumedrol started, heparin gtt started  Consults:  Oncology 5/30 bladder cancer  Procedures:    Significant Diagnostic Tests:  CT chest 5/31 >> atherosclerosis, extensive GGO with inter and intralobular thickening, fatty liver (reviewed by me)  Micro Data:  Blood 5/29 >> COVID 5/29 >> negative Urine 5/29 >> insignificant growth COVID 5/29 >> negative Influenza 5/30 >> negative  Antimicrobials:  Vancomycin 5/28 >> Cefepime 5/28 >>   Interim history/subjective:    Objective   Blood pressure (!) 98/49, pulse (!) 120, temperature 98.2 F (36.8 C), temperature source Axillary, resp. rate (!) 34, height '6\' 2"'  (1.88 m), weight 113.4 kg, SpO2 90 %.        Intake/Output Summary (Last 24  hours) at 09/10/2018 1449 Last data filed at 09/10/2018 0800 Gross per 24 hour  Intake 200 ml  Output 1050 ml  Net -850 ml   Filed Weights   09/08/18 0545  Weight: 113.4 kg    Examination:  General - alert Eyes - pupils reactive ENT - no sinus tenderness, no stridor Cardiac - regular rate, tachycardic, no murmur Chest - faint b/l crackles Abdomen - soft, non tender, + bowel sounds Extremities - no cyanosis, clubbing, or edema Skin - faint, flat, brownish spots on arms b/l Neuro - normal strength, moves extremities, follows commands Lymphatics - no lymphadenopathy Psych - normal mood and behavior    Resolved Hospital Problem list     Assessment & Plan:   Acute hypoxic respiratory failure with GGO pulmonary infiltrates. Discussion: Differential includes pulmonary toxicity from bladder cancer therapy (although Padcev has low incidence of pulmonary toxicity), other inflammatory reaction, atypical infection, or less likely pulmonary edema.  He has been on antibiotic therapy, and has negative fluid balance w/o improvement.  COVID testing negative x 2.  His ESR is elevated.  He also has elevated IL-6, ferritin, fibrinogen as nonpecific markers of inflammatory process.  Plan - continue ABx - agree with trial of solumedrol - oxygen to keep SpO2 > 90%; can try on heated high flow oxygen - f/u CXR - don't think he needs intubation at this time  Paroxysmal atrial fibrillation >> CHADSVASC score 3. Plan - heparin gtt started; uncertain whether he has thromboembolic disease also, but will be on therapy with heparin  gtt  Anemia in setting of metastatic bladder cancer on targeted therapy. Plan - f/u CBC after transfusion 5/31 - oncology consulted  CKD 3. Plan - monitor renal fx   Best practice:  Diet: heart healthy DVT prophylaxis: Heparin gtt GI prophylaxis: Not indicated Mobility: Bed rest Code Status: full code Disposition: ICU  Labs   CBC: Recent Labs  Lab  09/08/18 0255 09/09/18 0410 09/10/18 0240  WBC 8.8 10.2 7.0  NEUTROABS 7.8* 9.5* 6.0  HGB 7.8* 7.1* 7.0*  HCT 23.0* 22.1* 22.0*  MCV 93.5 95.3 96.1  PLT 90* 94* 91*    Basic Metabolic Panel: Recent Labs  Lab 09/08/18 0255 09/08/18 0312 09/08/18 0452 09/09/18 0410 09/10/18 0240  NA 132*  --   --  137 136  K 3.3*  --   --  3.2* 3.1*  CL 99  --   --  106 104  CO2 22  --   --  22 22  GLUCOSE 194*  --   --  129* 125*  BUN 40*  --   --  38* 36*  CREATININE 2.26*  --  2.30* 1.95* 1.74*  CALCIUM 8.0*  --   --  7.9* 8.1*  MG  --  1.7  --   --   --    GFR: Estimated Creatinine Clearance: 55.2 mL/min (A) (by C-G formula based on SCr of 1.74 mg/dL (H)). Recent Labs  Lab 09/08/18 0255 09/08/18 0358 09/08/18 1047 09/09/18 0410 09/10/18 0240  PROCALCITON  --   --  1.72  --   --   WBC 8.8  --   --  10.2 7.0  LATICACIDVEN  --  1.1  --   --   --     Liver Function Tests: Recent Labs  Lab 09/08/18 0255 09/09/18 0410 09/10/18 0240  AST 66* 79* 64*  ALT 47* 39 36  ALKPHOS 45 47 52  BILITOT 0.8 0.6 0.8  PROT 5.2* 5.0* 4.8*  ALBUMIN 2.5* 2.3* 2.1*   No results for input(s): LIPASE, AMYLASE in the last 168 hours. No results for input(s): AMMONIA in the last 168 hours.  ABG    Component Value Date/Time   PHART 7.431 09/10/2018 1432   PCO2ART 34.5 09/10/2018 1432   PO2ART 51.7 (L) 09/10/2018 1432   HCO3 22.5 09/10/2018 1432   TCO2 29 09/20/2016 0657   ACIDBASEDEF 0.9 09/10/2018 1432   O2SAT 85.7 09/10/2018 1432     Coagulation Profile: No results for input(s): INR, PROTIME in the last 168 hours.  Cardiac Enzymes: Recent Labs  Lab 09/08/18 0255 09/08/18 0312  CKTOTAL 9*  --   TROPONINI  --  0.03*    HbA1C: Hemoglobin A1c  Date/Time Value Ref Range Status  10/11/2016 07:49 AM 5.6 4.8 - 5.6 % Final    Comment:             Pre-diabetes: 5.7 - 6.4          Diabetes: >6.4          Glycemic control for adults with diabetes: <7.0     CBG: Recent Labs   Lab 09/05/18 0704 09/05/18 0841  GLUCAP 171* 171*    Review of Systems:   Reviewed and negative.  Past Medical History  He,  has a past medical history of Arthritis, 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 ([place]) (09/29/2016), Bladder tumor, Essential hypertension (06/23/2018), Goals of care, counseling/discussion (09/30/2016), History of prostate cancer (followed by pcp dr [name]-  per pt last  PSA undetectable), Hyperglycemia (06/23/2018), Hypertension, Lower urinary tract symptoms (LUTS), Mild hyperlipidemia (06/23/2018), Pre-diabetes, and Wears glasses.   Surgical History    Past Surgical History:  Procedure Laterality Date  . CATARACT EXTRACTION W/ INTRAOCULAR LENS  IMPLANT, BILATERAL Bilateral 2011  . [place]  . IR FLUORO GUIDE PORT INSERTION RIGHT  10/07/2016  . IR RADIOLOGIST EVAL & MGMT  01/05/2018  . IR US GUIDE VASC ACCESS RIGHT  10/07/2016  . KNEE ARTHROSCOPY Bilateral right 2006;  left 02-15-2007  . [place];  1990;  1983  . ROBOT ASSISTED LAPAROSCOPIC RADICAL PROSTATECTOMY  06/20/2006   bilateral nerve sparing  . TEE WITHOUT CARDIOVERSION N/A 06/27/2018   Procedure: TRANSESOPHAGEAL ECHOCARDIOGRAM (TEE);  Surgeon: Nigel Mormon, MD;  Location: [facility] ENDOSCOPY;  Service: Cardiovascular;  Laterality: N/A;  . TOTAL HIP ARTHROPLASTY Left 03/03/2015   Procedure: LEFT TOTAL HIP ARTHROPLASTY ANTERIOR APPROACH;  Surgeon: Dorna Leitz, MD;  Location: [place];  Service: Orthopedics;  Laterality: Left;  . TOTAL KNEE ARTHROPLASTY Bilateral left 08-13-2009;  right 12-26-2009  . TRANSURETHRAL RESECTION OF BLADDER TUMOR N/A 09/20/2016   Procedure: TRANSURETHRAL RESECTION OF BLADDER TUMOR (TURBT);  Surgeon: Franchot Gallo, MD;  Location: [facility];  Service: Urology;  Laterality: N/A;     Social History   reports that he quit smoking about 33 years ago. His smoking use included cigarettes. He quit after 16.00 years  of use. He has never used smokeless tobacco. He reports current alcohol use. He reports that he does not use drugs.   Family History   His family history includes Aneurysm in his mother; Emphysema in his father; Hypertension in his father and mother.   Allergies No Known Allergies   Home Medications  Prior to Admission medications   Medication Sig Start Date End Date Taking? Authorizing Provider  Cinnamon 500 MG TABS Take 1 tablet by mouth 2 (two) times daily.    Yes [provider]  dexamethasone (DECADRON) 4 MG tablet Take 2 tablets (8 mg total) by mouth daily. Start the day after chemotherapy for 2 days. 07/17/18  Yes Volanda Napoleon, MD  diltiazem (CARDIZEM CD) 240 MG 24 hr capsule Take 1 capsule (240 mg total) by mouth daily. 06/28/18  Yes Purohit, Konrad Dolores, MD  gabapentin (NEURONTIN) 400 MG capsule TAKE 1 CAPSULE (400 MG TOTAL) BY MOUTH 4 (FOUR) TIMES DAILY. 09/05/18  Yes Volanda Napoleon, MD  lidocaine-prilocaine (EMLA) cream Apply to affected area once 07/17/18  Yes Ennever, Rudell Cobb, MD  LORazepam (ATIVAN) 0.5 MG tablet TAKE 1 TABLET BY MOUTH EVERY 6 HOURS AS NEEDED FOR NAUSEA AND VOMITING Patient taking differently: Take 0.5 mg by mouth every 6 (six) hours as needed (nausea).  08/30/18  Yes [place], Holli Humbles, NP  losartan (COZAAR) 100 MG tablet Take 100 mg by mouth daily.   Yes [provider]  MAGNESIUM PO Take 400 mg by mouth at bedtime. Leg cramps   Yes [provider]  metoprolol tartrate (LOPRESSOR) 25 MG tablet Take 1 tablet (25 mg total) by mouth 2 (two) times daily. 06/28/18  Yes Purohit, Konrad Dolores, MD  ondansetron (ZOFRAN) 8 MG tablet Take 1 tablet (8 mg total) by mouth 2 (two) times daily as needed for refractory nausea / vomiting. Start on day 3 after chemo. 07/17/18  Yes Volanda Napoleon, MD  prochlorperazine (COMPAZINE) 10 MG tablet Take 1 tablet (10 mg total) by mouth every 6 (six) hours as needed (Nausea  or vomiting). 07/17/18  Yes Ennever, Rudell Cobb, MD   Pyridoxine HCl (VITAMIN B-6) 250 MG tablet Take 1 tablet (250 mg total) by mouth daily. 12/23/17  Yes Aline August, MD  senna (SENOKOT) 8.6 MG TABS tablet Take 1 tablet (8.6 mg total) by mouth 2 (two) times daily. 12/23/17   Aline August, MD     Critical care time: 17 minutes    [name], MD [place] 09/10/2018, 3:42 PM

## 2018-09-10 NOTE — Progress Notes (Signed)
PROGRESS NOTE    [name] Rose  MBW:[id number] DOB: 04/05/1951 DOA: 09/08/2018 PCP: Shirline Frees, MD    Brief Narrative:  68 year old gentleman with history of hypertension, hyperlipidemia, prediabetes, prostate cancer, metastasized bladder cancer on chemotherapy, paroxysmal A. fib, CKD stage III, recent MSSA bacteremia who was admitted to the hospital secondary to significant weakness and shortness of breath after receiving chemotherapy about 1 week ago.  In the emergency room, patient was afebrile, tachypneic, blood pressures 80/50 improved with oxygenation.  Chest x-ray significant for diffuse interstitial groundglass opacities concerning for atypical pneumonia.  Treated with IV fluid boluses and IV antibiotics and admitted to the stepdown unit. Patient remained significantly hypoxic all throughout.   Assessment & Plan:   Principal Problem:   Pneumonia Active Problems:   LFT elevation   AKI (acute kidney injury) ([facility])   CKD (chronic kidney disease), stage III (HCC)   Acute respiratory failure with hypoxia (HCC)   Atypical pneumonia  Acute hypoxic respiratory failure: No previous oxygen use.  Suspect due to atypical pneumonia in a patient with receiving chemotherapy. Keep on oxygen to keep saturations more than 90%.  Aggressive chest physiotherapy and bronchodilator therapy. Still on significant supplemental oxygen necessitating look for alternative diagnosis as below.  Presumed pulmonary embolism: Significant new onset hypoxia D-dimer, 1.73-19 and 24 hours Lower extremity duplexes negative Cannot have CT scan with contrast because of CKD Cannot have VQ scan on high flow oxygen and COVID-19 logistic limitations. High pretest probability of thromboembolism, will treat as PE.  Follow-up CT with contrast once renal function improved.  Bacterial pneumonia: Was treated with vancomycin and cefepime, MRSA negative, vancomycin discontinued.  Continue on cefepime until clinical  improvement.  Chest physiotherapy, incentive spirometry, deep breathing exercises, sputum induction, mucolytic's and bronchodilators. All cultures are negative. Very atypical looking pneumonia possible on immunocompromised host. Will check CT scan of the chest without contrast to better define it.  COVID-19: Ruled out with 2- results.  Acute kidney injury on chronic kidney disease stage III: Treated with IV fluids with improvement of creatinine.  At about baseline level of 1.7-1.8.  Acute on chronic anemia of chronic disease: Hemoglobin is 7 today.  Patient with hypoxia and tachycardia not improved with conventional treatment.  Patient has a strong indication for PRBC transfusion to improve his hypoxia and tachycardia.  Will order 1 unit of PRBC today.  Consented.  Paroxysmal A. fib: Remains tachycardic.  Resumed metoprolol and diltiazem.  PRN metoprolol.  Metastatic bladder cancer with prostate cancer currently on chemotherapy: Followed by oncology.  History of hypertension: Blood pressure medications on hold.  Resuming Cardizem and metoprolol for tachycardia.  Hypokalemia: Replace aggressively.  Monitor levels.   DVT prophylaxis: Heparin IV Code Status: Full code Family Communication: We will update his wife Disposition Plan: Stepdown unit   Consultants:   Oncology  Procedures:   None  Antimicrobials:   Vancomycin, 09/08/2018--09/09/2018  Cefepime, 09/08/2018----   Subjective: Patient seen and examined.  Complains of shortness of breath and some dry cough.  No sputum production.  Remains on 15 L oxygen and occasionally on nonrebreather.  Afebrile overnight.  Feels weak and tired.  Objective: Vitals:   09/10/18 0632 09/10/18 0700 09/10/18 0800 09/10/18 0900  BP: (!) 102/40 (!) 91/58 (!) 97/55 114/60  Pulse: (!) 120 (!) 106 (!) 133 (!) 134  Resp: (!) 29 (!) 31 (!) 28 (!) 35  Temp:   97.7 F (36.5 C)   TempSrc:   Oral   SpO2: 98% 100% 98%  92%  Weight:      Height:         Intake/Output Summary (Last 24 hours) at 09/10/2018 1000 Last data filed at 09/10/2018 0800 Gross per 24 hour  Intake 200 ml  Output 1050 ml  Net -850 ml   Filed Weights   09/08/18 0545  Weight: 113.4 kg    Examination:  General exam: Appears anxious on mild distress on high flow oxygen.  Chronically sick looking. Respiratory system: Bilateral poor air entry with no added sounds.  Port on right chest clean and dry. Cardiovascular system: S1 & S2 heard, irregularly irregular and tachycardic.  No pedal edema. Gastrointestinal system: Abdomen is distended but soft.  nontender. No organomegaly or masses felt. Normal bowel sounds heard. Central nervous system: Alert and oriented. No focal neurological deficits. Extremities: Symmetric 5 x 5 power. Skin: No rashes, lesions or ulcers Psychiatry: Judgement and insight appear normal. Mood & affect anxious.    Data Reviewed: I have personally reviewed following labs and imaging studies  CBC: Recent Labs  Lab 09/08/18 0255 09/09/18 0410 09/10/18 0240  WBC 8.8 10.2 7.0  NEUTROABS 7.8* 9.5* 6.0  HGB 7.8* 7.1* 7.0*  HCT 23.0* 22.1* 22.0*  MCV 93.5 95.3 96.1  PLT 90* 94* 91*   Basic Metabolic Panel: Recent Labs  Lab 09/08/18 0255 09/08/18 0312 09/08/18 0452 09/09/18 0410 09/10/18 0240  NA 132*  --   --  137 136  K 3.3*  --   --  3.2* 3.1*  CL 99  --   --  106 104  CO2 22  --   --  22 22  GLUCOSE 194*  --   --  129* 125*  BUN 40*  --   --  38* 36*  CREATININE 2.26*  --  2.30* 1.95* 1.74*  CALCIUM 8.0*  --   --  7.9* 8.1*  MG  --  1.7  --   --   --    GFR: Estimated Creatinine Clearance: 55.2 mL/min (A) (by C-G formula based on SCr of 1.74 mg/dL (H)). Liver Function Tests: Recent Labs  Lab 09/08/18 0255 09/09/18 0410 09/10/18 0240  AST 66* 79* 64*  ALT 47* 39 36  ALKPHOS 45 47 52  BILITOT 0.8 0.6 0.8  PROT 5.2* 5.0* 4.8*  ALBUMIN 2.5* 2.3* 2.1*   No results for input(s): LIPASE, AMYLASE in the last 168  hours. No results for input(s): AMMONIA in the last 168 hours. Coagulation Profile: No results for input(s): INR, PROTIME in the last 168 hours. Cardiac Enzymes: Recent Labs  Lab 09/08/18 0255 09/08/18 0312  CKTOTAL 9*  --   TROPONINI  --  0.03*   BNP (last 3 results) No results for input(s): PROBNP in the last 8760 hours. HbA1C: No results for input(s): HGBA1C in the last 72 hours. CBG: Recent Labs  Lab 09/05/18 0704 09/05/18 0841  GLUCAP 171* 171*   Lipid Profile: Recent Labs    09/08/18 1047  TRIG 94   Thyroid Function Tests: No results for input(s): TSH, T4TOTAL, FREET4, T3FREE, THYROIDAB in the last 72 hours. Anemia Panel: Recent Labs    09/08/18 1047  FERRITIN 3,307*   Sepsis Labs: Recent Labs  Lab 09/08/18 0358 09/08/18 1047  PROCALCITON  --  1.72  LATICACIDVEN 1.1  --     Recent Results (from the past 240 hour(s))  Blood Culture (routine x 2)     Status: None (Preliminary result)   Collection Time: 09/08/18  3:58 AM  Result Value Ref Range Status   Specimen Description   Final    BLOOD RIGHT PORTA CATH Performed at [facility], [address], [place], [place] [address]    Special Requests   Final    BOTTLES DRAWN AEROBIC AND ANAEROBIC Blood Culture adequate volume Performed at [facility], [address] [place], [place] [address]    Culture   Final    NO GROWTH 1 DAY Performed at [facility], [address] [place], [place] [address]    Report Status PENDING  Incomplete  SARS Coronavirus 2 (CEPHEID - Performed in [facility] hospital lab), Hosp Order     Status: None   Collection Time: 09/08/18  3:58 AM  Result Value Ref Range Status   SARS Coronavirus 2 NEGATIVE NEGATIVE Final    Comment: (NOTE) If result is NEGATIVE SARS-CoV-2 target nucleic acids are NOT DETECTED. The SARS-CoV-2 RNA is generally detectable in upper and lower  respiratory specimens during the acute phase of infection.  The lowest  concentration of SARS-CoV-2 viral copies this assay can detect is 250  copies / mL. A negative result does not preclude SARS-CoV-2 infection  and should not be used as the sole basis for treatment or other  patient management decisions.  A negative result may occur with  improper specimen collection / handling, submission of specimen other  than nasopharyngeal swab, presence of viral mutation(s) within the  areas targeted by this assay, and inadequate number of viral copies  (<250 copies / mL). A negative result must be combined with clinical  observations, patient history, and epidemiological information. If result is POSITIVE SARS-CoV-2 target nucleic acids are DETECTED. The SARS-CoV-2 RNA is generally detectable in upper and lower  respiratory specimens dur ing the acute phase of infection.  Positive  results are indicative of active infection with SARS-CoV-2.  Clinical  correlation with patient history and other diagnostic information is  necessary to determine patient infection status.  Positive results do  not rule out bacterial infection or co-infection with other viruses. If result is PRESUMPTIVE POSTIVE SARS-CoV-2 nucleic acids MAY BE PRESENT.   A presumptive positive result was obtained on the submitted specimen  and confirmed on repeat testing.  While 2019 novel coronavirus  (SARS-CoV-2) nucleic acids may be present in the submitted sample  additional confirmatory testing may be necessary for epidemiological  and / or clinical management purposes  to differentiate between  SARS-CoV-2 and other Sarbecovirus currently known to infect humans.  If clinically indicated additional testing with an alternate test  methodology [phone]) is advised. The SARS-CoV-2 RNA is generally  detectable in upper and lower respiratory sp ecimens during the acute  phase of infection. The expected result is Negative. Fact Sheet for Patients:   BoilerBrush.com.cy Fact Sheet for Healthcare Providers: https://pope.com/ This test is not yet approved or cleared by the [place] FDA and has been authorized for detection and/or diagnosis of SARS-CoV-2 by FDA under an Emergency Use Authorization (EUA).  This EUA will remain in effect (meaning this test can be used) for the duration of the COVID-19 declaration under Section 564(b)(1) of the Act, 21 U.S.C. section 360bbb-3(b)(1), unless the authorization is terminated or revoked sooner. Performed at [facility], [address], [place], [place] [address]   Blood Culture (routine x 2)     Status: None (Preliminary result)   Collection Time: 09/08/18  4:43 AM  Result Value Ref Range Status   Specimen Description  Final    BLOOD LEFT ANTECUBITAL Performed at [facility] [address] [place], [place] [address]    Special Requests   Final    BOTTLES DRAWN AEROBIC AND ANAEROBIC Blood Culture adequate volume Performed at [facility] [address], [place], [place] [address]    Culture   Final    NO GROWTH 1 DAY Performed at [facility], [place] [address], [place], [place] [address]    Report Status PENDING  Incomplete  Urine culture     Status: Abnormal   Collection Time: 09/08/18  8:25 AM  Result Value Ref Range Status   Specimen Description   Final    URINE, RANDOM Performed at [facility] [address] [place], [place] [address]    Special Requests   Final    NONE Performed at [facility], [employer] [address], [place], [place] [address]    Culture (A)  Final    <10,000 COLONIES/mL INSIGNIFICANT GROWTH Performed at [facility] [address], [place], [place] [address]    Report Status 09/09/2018 FINAL  Final  MRSA PCR Screening     Status: None   Collection Time: 09/08/18  8:52 AM  Result Value Ref Range  Status   MRSA by PCR NEGATIVE NEGATIVE Final    Comment:        The GeneXpert MRSA Assay (FDA approved for NASAL specimens only), is one component of a comprehensive MRSA colonization surveillance program. It is not intended to diagnose MRSA infection nor to guide or monitor treatment for MRSA infections. Performed at [facility], [place] [address], [place], [place] [address]   Novel Coronavirus,NAA,(SEND-OUT TO REF LAB - TAT 24-48 hrs); Hosp Order     Status: None   Collection Time: 09/08/18 10:47 AM  Result Value Ref Range Status   SARS-CoV-2, NAA NOT DETECTED NOT DETECTED Final    Comment: (NOTE) This test was developed and its performance characteristics determined by [employer]. This test has not been FDA cleared or approved. This test has been authorized by FDA under an Emergency Use Authorization (EUA). This test is only authorized for the duration of time the declaration that circumstances exist justifying the authorization of the emergency use of in vitro diagnostic tests for detection of SARS-CoV-2 virus and/or diagnosis of COVID-19 infection under section 564(b)(1) of the Act, 21 U.S.C. 063KZS-0(F)(0), unless the authorization is terminated or revoked sooner. When diagnostic testing is negative, the possibility of a false negative result should be considered in the context of a patient's recent exposures and the presence of clinical signs and symptoms consistent with COVID-19. An individual without symptoms of COVID-19 and who is not shedding SARS-CoV-2 virus would expect to have a negative (not detected) result in this assay. Performed  At: [facility] [place], [place] [id number] Rush Farmer MD KG:[id number]    [place]  Final    Comment: Performed at [facility] [address], [place], [place] [address]         Radiology Studies: Vas [place] Lower Extremity  Venous (dvt)  Result Date: 09/09/2018  Lower Venous Study Indications: SOB, and pulmonary embolism.  Performing Technologist: Abram Sander RVS  Examination Guidelines: A complete evaluation includes B-mode imaging, spectral Doppler, color Doppler, and power Doppler as needed of all accessible portions of each vessel. Bilateral testing is considered an integral part of a complete examination. Limited examinations for  reoccurring indications may be performed as noted.  +---------+---------------+---------+-----------+----------+-------+  RIGHT     Compressibility Phasicity Spontaneity Properties Summary  +---------+---------------+---------+-----------+----------+-------+  CFV       Full            Yes       Yes                             +---------+---------------+---------+-----------+----------+-------+  SFJ       Full                                                      +---------+---------------+---------+-----------+----------+-------+  FV Prox   Full                                                      +---------+---------------+---------+-----------+----------+-------+  FV Mid    Full                                                      +---------+---------------+---------+-----------+----------+-------+  FV Distal Full                                                      +---------+---------------+---------+-----------+----------+-------+  PFV       Full                                                      +---------+---------------+---------+-----------+----------+-------+  POP       Full            Yes       Yes                             +---------+---------------+---------+-----------+----------+-------+  PTV       Full                                                      +---------+---------------+---------+-----------+----------+-------+  PERO      Full                                                      +---------+---------------+---------+-----------+----------+-------+    +---------+---------------+---------+-----------+----------+--------------+  LEFT      Compressibility Phasicity Spontaneity Properties Summary         +---------+---------------+---------+-----------+----------+--------------+  CFV       Full  Yes       Yes                                    +---------+---------------+---------+-----------+----------+--------------+  SFJ       Full                                                             +---------+---------------+---------+-----------+----------+--------------+  FV Prox   Full                                                             +---------+---------------+---------+-----------+----------+--------------+  FV Mid    Full                                                             +---------+---------------+---------+-----------+----------+--------------+  FV Distal Full                                                             +---------+---------------+---------+-----------+----------+--------------+  PFV       Full                                                             +---------+---------------+---------+-----------+----------+--------------+  POP       Full            Yes       Yes                                    +---------+---------------+---------+-----------+----------+--------------+  PTV       Full                                                             +---------+---------------+---------+-----------+----------+--------------+  PERO                                                       Not visualized  +---------+---------------+---------+-----------+----------+--------------+     Summary: Right: There is no evidence of deep vein thrombosis in the lower extremity. No cystic structure found  in the popliteal fossa. Left: There is no evidence of deep vein thrombosis in the lower extremity. No cystic structure found in the popliteal fossa.  *See table(s) above for measurements and observations.    Preliminary          Scheduled Meds:  sodium chloride   Intravenous Once   sodium chloride   Intravenous Once   Chlorhexidine Gluconate Cloth  6 each Topical Daily   diltiazem  240 mg Oral Daily   furosemide  20 mg Intravenous Once   gabapentin  300 mg Oral TID   heparin  3,000 Units Intravenous Once   magnesium oxide  400 mg Oral Daily   mouth rinse  15 mL Mouth Rinse BID   metoprolol tartrate  25 mg Oral BID   potassium chloride  40 mEq Oral BID   vitamin B-6  300 mg Oral Daily   senna  1 tablet Oral BID   Continuous Infusions:  ceFEPime (MAXIPIME) IV Stopped (09/10/18 0548)   heparin       LOS: 2 days    Time spent: 35 minutes    Barb Merino, MD [facility] Pager [phone]  If 7PM-7AM, please contact night-coverage [email or web address] Password [email or web address] 09/10/2018, 10:00 AM

## 2018-09-10 NOTE — Consult Note (Signed)
[facility]    Date of Admission:  09/08/2018   Total days of antibiotics: 3 cefepime               Reason for Consult: CAP    Referring Provider: Ghimire   Assessment: Pneumonia, ground glass opacities.   Plan: 1. Add azithro 2. Check legionella 3. Check mycoplasma, chlamydia 4. Will follow CCM eval on need for BAL.   Comment- Could think of viral pathogens such as CMV in this pt who has received chemo, however his ANC is 6,000. A BAL with viral Cx would be helpful for this but would not recommend unless he fails to progress.  Certainly his chemo could cause this. The FDA site reports dyspnea in 3% but does not list the cause of that. Will defer to Dr Marin Olp to comment on this.  I don't think he needs further COVID testing.   Thank you so much for this interesting consult,  Principal Problem:   Pneumonia Active Problems:   LFT elevation   AKI (acute kidney injury) ([place])   CKD (chronic kidney disease), stage III (HCC)   Acute respiratory failure with hypoxia (HCC)   Atypical pneumonia    Chlorhexidine Gluconate Cloth  6 each Topical Daily   diltiazem  240 mg Oral Daily   gabapentin  300 mg Oral TID   magnesium oxide  400 mg Oral Daily   mouth rinse  15 mL Mouth Rinse BID   methylPREDNISolone (SOLU-MEDROL) injection  80 mg Intravenous Q6H   metoprolol tartrate  25 mg Oral BID   potassium chloride  40 mEq Oral BID   vitamin B-6  300 mg Oral Daily   senna  1 tablet Oral BID    HPI: [name] Rose is a 68 y.o. male with hx of metastatic bladder cancer (dx 2018, on Padcev, last dose 5-18, next dose due 6-1), CKD3, MSSA bacteremia 06-2018.  Around midnight of 5-29, he developed SOB and weakness. He came to ED and was found to be hypoxic (78% RA). He had CT showing diffuse ground glass opacities. His WBC was 8.8 and his temp was afebrile. He was hypotensive 80/50.  He was started on vanco/cefepime and solucortef. His vanco  was stopped on 5-29.  He has had COVID (-) x 2.  LE doppler (-).  He was adm to step down and has since had temp 101.5 and has been maintained on 15L NRB mask.  His D dimer is 19.62. Due to suspicion for PE and inability to get v/q or CT angio, he was started on heparin.   R chest port.   Review of Systems: Review of Systems  Constitutional: Positive for fever. Negative for chills.  Respiratory: Positive for cough and shortness of breath. Negative for sputum production and wheezing.   Gastrointestinal: Negative for constipation and diarrhea.  Genitourinary: Negative for dysuria.  Neurological: Positive for sensory change.  Please see HPI. All other systems reviewed and negative.   Past Medical History:  Diagnosis Date   Arthritis    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 ([place]) 09/29/2016   Bladder tumor    Essential hypertension 06/23/2018   Goals of care, counseling/discussion 09/30/2016   History of prostate cancer followed by pcp dr [name]-  per pt last PSA undetectable   dx 2008-- (Stage T1c, Gleason 3+3,  PSA 4.58, vol 99cc)  s/p  radical prostatectomy (nerve sparing bilateral)    Hyperglycemia 06/23/2018  Hypertension    Lower urinary tract symptoms (LUTS)    Mild hyperlipidemia 06/23/2018   Pre-diabetes    Wears glasses     Social History   Tobacco Use   Smoking status: Former Smoker    Years: 16.00    Types: Cigarettes    Last attempt to quit: 09/25/1984    Years since quitting: 33.9   Smokeless tobacco: Never Used  Substance Use Topics   Alcohol use: Yes    Comment: occasionally   Drug use: No    Family History  Problem Relation Age of Onset   Hypertension Mother    Aneurysm Mother    Emphysema Father    Hypertension Father      Medications:  Scheduled:  Chlorhexidine Gluconate Cloth  6 each Topical Daily   diltiazem  240 mg Oral Daily   gabapentin  300 mg Oral TID   magnesium oxide  400 mg Oral Daily    mouth rinse  15 mL Mouth Rinse BID   methylPREDNISolone (SOLU-MEDROL) injection  80 mg Intravenous Q6H   metoprolol tartrate  25 mg Oral BID   potassium chloride  40 mEq Oral BID   vitamin B-6  300 mg Oral Daily   senna  1 tablet Oral BID    Abtx:  Anti-infectives (From admission, onward)   Start     Dose/Rate Route Frequency Ordered Stop   09/09/18 1800  vancomycin (VANCOCIN) 1,250 mg in sodium chloride 0.9 % 250 mL IVPB  Status:  Discontinued     1,250 mg 166.7 mL/hr over 90 Minutes Intravenous Every 24 hours 09/08/18 1704 09/08/18 1744   09/08/18 1800  vancomycin (VANCOCIN) 2,000 mg in sodium chloride 0.9 % 500 mL IVPB  Status:  Discontinued     2,000 mg 250 mL/hr over 120 Minutes Intravenous  Once 09/08/18 1704 09/08/18 1744   09/08/18 1800  ceFEPIme (MAXIPIME) 2 g in sodium chloride 0.9 % 100 mL IVPB     2 g 200 mL/hr over 30 Minutes Intravenous Every 12 hours 09/08/18 1704     09/08/18 0500  vancomycin (VANCOCIN) IVPB 1000 mg/200 mL premix     1,000 mg 200 mL/hr over 60 Minutes Intravenous  Once 09/08/18 0446 09/08/18 0612   09/08/18 0500  ceFEPIme (MAXIPIME) 2 g in sodium chloride 0.9 % 100 mL IVPB     2 g 200 mL/hr over 30 Minutes Intravenous  Once 09/08/18 0446 09/08/18 0544        OBJECTIVE: Blood pressure (!) 103/59, pulse 89, temperature 98.2 F (36.8 C), temperature source Axillary, resp. rate (!) 33, height _0  (1.88 m), weight 113.4 kg, SpO2 95 %.  Physical Exam Constitutional:      Appearance: Normal appearance. He is ill-appearing.  HENT:     Mouth/Throat:     Mouth: Mucous membranes are moist.     Pharynx: No oropharyngeal exudate.  Eyes:     Extraocular Movements: Extraocular movements intact.     Pupils: Pupils are equal, round, and reactive to light.  Neck:     Musculoskeletal: Normal range of motion and neck supple.  Cardiovascular:     Rate and Rhythm: Tachycardia present.  Pulmonary:     Breath sounds: Rhonchi present.     Comments:  Mild accessory muscle use.  Chest:    Abdominal:     General: Bowel sounds are normal. There is no distension.     Palpations: Abdomen is soft.     Tenderness: There is no abdominal tenderness.  Musculoskeletal:     Right lower leg: Edema present.     Left lower leg: Edema present.  Neurological:     Mental Status: He is alert.     Lab Results Results for orders placed or performed during the hospital encounter of 09/08/18 (from the past 48 hour(s))  Comprehensive metabolic panel     Status: Abnormal   Collection Time: 09/09/18  4:10 AM  Result Value Ref Range   Sodium 137 135 - 145 mmol/L   Potassium 3.2 (L) 3.5 - 5.1 mmol/L   Chloride 106 98 - 111 mmol/L   CO2 22 22 - 32 mmol/L   Glucose, Bld 129 (H) 70 - 99 mg/dL   BUN 38 (H) 8 - 23 mg/dL   Creatinine, Ser 1.95 (H) 0.61 - 1.24 mg/dL   Calcium 7.9 (L) 8.9 - 10.3 mg/dL   Total Protein 5.0 (L) 6.5 - 8.1 g/dL   Albumin 2.3 (L) 3.5 - 5.0 g/dL   AST 79 (H) 15 - 41 U/L   ALT 39 0 - 44 U/L   Alkaline Phosphatase 47 38 - 126 U/L   Total Bilirubin 0.6 0.3 - 1.2 mg/dL   GFR calc non Af Amer 35 (L) >60 mL/min   GFR calc Af Amer 40 (L) >60 mL/min   Anion gap 9 5 - 15    Comment: Performed at [facility], [place] [address], [place], [place] [address]  CBC with Differential/Platelet     Status: Abnormal   Collection Time: 09/09/18  4:10 AM  Result Value Ref Range   WBC 10.2 4.0 - 10.5 K/uL   RBC 2.32 (L) 4.22 - 5.81 MIL/uL   Hemoglobin 7.1 (L) 13.0 - 17.0 g/dL   HCT 22.1 (L) 39.0 - 52.0 %   MCV 95.3 80.0 - 100.0 fL   MCH 30.6 26.0 - 34.0 pg   MCHC 32.1 30.0 - 36.0 g/dL   RDW 16.8 (H) 11.5 - 15.5 %   Platelets 94 (L) 150 - 400 K/uL    Comment: REPEATED TO VERIFY Immature Platelet Fraction may be clinically indicated, consider ordering this additional test [id number] CONSISTENT WITH PREVIOUS RESULT    nRBC 0.0 0.0 - 0.2 %   Neutrophils Relative % 92 %   Neutro Abs 9.5 (H) 1.7 - 7.7 K/uL   Lymphocytes  Relative 4 %   Lymphs Abs 0.4 (L) 0.7 - 4.0 K/uL   Monocytes Relative 3 %   Monocytes Absolute 0.3 0.1 - 1.0 K/uL   Eosinophils Relative 0 %   Eosinophils Absolute 0.0 0.0 - 0.5 K/uL   Basophils Relative 0 %   Basophils Absolute 0.0 0.0 - 0.1 K/uL   Immature Granulocytes 1 %   Abs Immature Granulocytes 0.07 0.00 - 0.07 K/uL    Comment: Performed at [facility], [place] [address] [place], [place] [address]  Type and screen     Status: None (Preliminary result)   Collection Time: 09/09/18  8:04 AM  Result Value Ref Range   ABO/RH(D) A POS    Antibody Screen NEG    Sample Expiration 09/12/2018,2359    Unit Number [id number]    Blood Component Type RED CELLS,LR    Unit division 00    Status of Unit ISSUED    Transfusion Status OK TO TRANSFUSE    Crossmatch Result      Compatible Performed at [place] [address], [place], [place] [address]   Prepare RBC  Status: None   Collection Time: 09/09/18  8:26 AM  Result Value Ref Range   Order Confirmation      ORDER PROCESSED BY BLOOD BANK Performed at [facility], [place] [address], [place], [place] [address]   Influenza panel by PCR (type A & B)     Status: None   Collection Time: 09/09/18  9:37 AM  Result Value Ref Range   Influenza A By PCR NEGATIVE NEGATIVE   Influenza B By PCR NEGATIVE NEGATIVE    Comment: (NOTE) The Xpert Xpress Flu assay is intended as an aid in the diagnosis of  influenza and should not be used as a sole basis for treatment.  This  assay is FDA approved for nasopharyngeal swab specimens only. Nasal  washings and aspirates are unacceptable for Xpert Xpress Flu testing. Performed at [facility], [place] [address] [place], [place] [address]   C-reactive protein     Status: Abnormal   Collection Time: 09/09/18  3:58 PM  Result Value Ref Range   CRP 24.3 (H) <1.0 mg/dL    Comment: Performed at [facility], [place] [address] [place], [place] [address]  D-dimer, quantitative (not at [facility])     Status: Abnormal   Collection Time: 09/09/18  3:58 PM  Result Value Ref Range   D-Dimer, Quant 19.62 (H) 0.00 - 0.50 ug/mL-FEU    Comment: (NOTE) At the manufacturer cut-off of 0.50 ug/mL FEU, this assay has been documented to exclude PE with a sensitivity and negative predictive value of 97 to 99%.  At this time, this assay has not been approved by the FDA to exclude DVT/VTE. Results should be correlated with clinical presentation. Performed at [facility], [place] [address] [place], [place] [address]   Comprehensive metabolic panel     Status: Abnormal   Collection Time: 09/10/18  2:40 AM  Result Value Ref Range   Sodium 136 135 - 145 mmol/L   Potassium 3.1 (L) 3.5 - 5.1 mmol/L   Chloride 104 98 - 111 mmol/L   CO2 22 22 - 32 mmol/L   Glucose, Bld 125 (H) 70 - 99 mg/dL   BUN 36 (H) 8 - 23 mg/dL   Creatinine, Ser 1.74 (H) 0.61 - 1.24 mg/dL   Calcium 8.1 (L) 8.9 - 10.3 mg/dL   Total Protein 4.8 (L) 6.5 - 8.1 g/dL   Albumin 2.1 (L) 3.5 - 5.0 g/dL   AST 64 (H) 15 - 41 U/L   ALT 36 0 - 44 U/L   Alkaline Phosphatase 52 38 - 126 U/L   Total Bilirubin 0.8 0.3 - 1.2 mg/dL   GFR calc non Af Amer 40 (L) >60 mL/min   GFR calc Af Amer 46 (L) >60 mL/min   Anion gap 10 5 - 15    Comment: Performed at [facility], [place] [address], [place], [place] [address]  CBC with Differential/Platelet     Status: Abnormal   Collection Time: 09/10/18  2:40 AM  Result Value Ref Range   WBC 7.0 4.0 - 10.5 K/uL   RBC 2.29 (L) 4.22 - 5.81 MIL/uL   Hemoglobin 7.0 (L) 13.0 - 17.0 g/dL   HCT 22.0 (L) 39.0 - 52.0 %   MCV 96.1 80.0 - 100.0 fL   MCH 30.6 26.0 - 34.0 pg   MCHC 31.8 30.0 - 36.0 g/dL   RDW 16.9 (H) 11.5 - 15.5 %   Platelets 91 (L) 150 -  400 K/uL    Comment: REPEATED TO VERIFY PLATELET COUNT CONFIRMED BY SMEAR Immature Platelet Fraction may be clinically  indicated, consider ordering this additional test [id number]    nRBC 0.0 0.0 - 0.2 %   Neutrophils Relative % 87 %   Neutro Abs 6.0 1.7 - 7.7 K/uL   Lymphocytes Relative 7 %   Lymphs Abs 0.5 (L) 0.7 - 4.0 K/uL   Monocytes Relative 3 %   Monocytes Absolute 0.2 0.1 - 1.0 K/uL   Eosinophils Relative 2 %   Eosinophils Absolute 0.2 0.0 - 0.5 K/uL   Basophils Relative 0 %   Basophils Absolute 0.0 0.0 - 0.1 K/uL   WBC Morphology TOXIC GRANULATION    Immature Granulocytes 1 %   Abs Immature Granulocytes 0.05 0.00 - 0.07 K/uL    Comment: Performed at [facility], [place] [address] [place], [place] [address]  Prepare RBC     Status: None   Collection Time: 09/10/18  9:42 AM  Result Value Ref Range   Order Confirmation      ORDER PROCESSED BY BLOOD BANK Performed at [facility], [place] [address], [place], [place] [address]   Blood gas, arterial     Status: Abnormal   Collection Time: 09/10/18  2:32 PM  Result Value Ref Range   O2 Content 19.5 L/min   Delivery systems NON-REBREATHER OXYGEN MASK    pH, Arterial 7.431 7.350 - 7.450   pCO2 arterial 34.5 32.0 - 48.0 mmHg   pO2, Arterial 51.7 (L) 83.0 - 108.0 mmHg   Bicarbonate 22.5 20.0 - 28.0 mmol/L   Acid-base deficit 0.9 0.0 - 2.0 mmol/L   O2 Saturation 85.7 %   Patient temperature 98.6    Collection site RIGHT RADIAL    Drawn by COLLECTED BY RT    Sample type ARTERIAL DRAW    Allens test (pass/fail) PASS PASS    Comment: Performed at [facility], [place] [address], [place], [place] [address]      Component Value Date/Time   SDES  09/08/2018 0825    URINE, RANDOM Performed at [facility], [place] [address], [place], [place] [address]    SPECREQUEST  09/08/2018 0825    NONE Performed at [facility], [place] [address], [place], [place] [address]    CULT (A) 09/08/2018 0825    <10,000 COLONIES/mL INSIGNIFICANT GROWTH Performed at [place] [address] [place], [place] [address]    REPTSTATUS 09/09/2018 FINAL 09/08/2018 0825   Ct Chest Wo Contrast  Result Date: 09/10/2018 CLINICAL DATA:  Severe shortness of breath. EXAM: CT CHEST WITHOUT CONTRAST TECHNIQUE: Multidetector CT imaging of the chest was performed following the standard protocol without IV contrast. COMPARISON:  Single-view of the chest 09/08/2018. PA and lateral chest 05/14/2017. PET CT scan 09/05/2018. FINDINGS: Cardiovascular: There is mild cardiomegaly. Calcific aortic and coronary atherosclerosis noted. No pericardial effusion. Mediastinum/Nodes: No enlarged mediastinal or axillary lymph nodes. Thyroid gland, trachea, and esophagus demonstrate no significant findings. Lungs/Pleura: Extensive ground-glass attenuation is present throughout the lungs with inter and intralobular septal thickening. Small areas of sparing in the superior segment of the right lower lobe and posterior left lower lobe noted. No nodule or mass. No pleural effusion. Upper Abdomen: There is fatty infiltration of the visualized liver. Musculoskeletal: No acute or focal abnormality. IMPRESSION: Diffusely abnormal appearance of the lungs is nonspecific and can be seen in bacterial pneumonia, acute interstitial pneumonia, ARDS among other  causes. Cardiomegaly. Fatty infiltration of the liver. Aortic Atherosclerosis (ICD10-I70.0). Electronically Signed   By: Inge Rise M.D.   On: 09/10/2018 12:56   Vas [place] Lower Extremity Venous (dvt)  Result Date: 09/10/2018  Lower Venous Study Indications: SOB, and pulmonary embolism.  Performing Technologist: Abram Sander RVS  Examination Guidelines: A complete evaluation includes B-mode imaging, spectral Doppler, color Doppler, and power Doppler as needed of all accessible portions of each vessel. Bilateral testing is considered an integral part of a complete examination. Limited examinations for reoccurring indications may be performed as noted.   +---------+---------------+---------+-----------+----------+-------+  RIGHT     Compressibility Phasicity Spontaneity Properties Summary  +---------+---------------+---------+-----------+----------+-------+  CFV       Full            Yes       Yes                             +---------+---------------+---------+-----------+----------+-------+  SFJ       Full                                                      +---------+---------------+---------+-----------+----------+-------+  FV Prox   Full                                                      +---------+---------------+---------+-----------+----------+-------+  FV Mid    Full                                                      +---------+---------------+---------+-----------+----------+-------+  FV Distal Full                                                      +---------+---------------+---------+-----------+----------+-------+  PFV       Full                                                      +---------+---------------+---------+-----------+----------+-------+  POP       Full            Yes       Yes                             +---------+---------------+---------+-----------+----------+-------+  PTV       Full                                                      +---------+---------------+---------+-----------+----------+-------+  PERO  Full                                                      +---------+---------------+---------+-----------+----------+-------+   +---------+---------------+---------+-----------+----------+--------------+  LEFT      Compressibility Phasicity Spontaneity Properties Summary         +---------+---------------+---------+-----------+----------+--------------+  CFV       Full            Yes       Yes                                    +---------+---------------+---------+-----------+----------+--------------+  SFJ       Full                                                              +---------+---------------+---------+-----------+----------+--------------+  FV Prox   Full                                                             +---------+---------------+---------+-----------+----------+--------------+  FV Mid    Full                                                             +---------+---------------+---------+-----------+----------+--------------+  FV Distal Full                                                             +---------+---------------+---------+-----------+----------+--------------+  PFV       Full                                                             +---------+---------------+---------+-----------+----------+--------------+  POP       Full            Yes       Yes                                    +---------+---------------+---------+-----------+----------+--------------+  PTV       Full                                                             +---------+---------------+---------+-----------+----------+--------------+  PERO                                                       Not visualized  +---------+---------------+---------+-----------+----------+--------------+     Summary: Right: There is no evidence of deep vein thrombosis in the lower extremity. No cystic structure found in the popliteal fossa. Left: There is no evidence of deep vein thrombosis in the lower extremity. No cystic structure found in the popliteal fossa.  *See table(s) above for measurements and observations. Electronically signed by Monica Martinez MD on 09/10/2018 at 3:05:06 PM.    Final    Recent Results (from the past 240 hour(s))  Blood Culture (routine x 2)     Status: None (Preliminary result)   Collection Time: 09/08/18  3:58 AM  Result Value Ref Range Status   Specimen Description   Final    BLOOD RIGHT PORTA CATH Performed at [facility], [place] [address] [place], [place] [address]    Special Requests   Final    BOTTLES DRAWN AEROBIC AND ANAEROBIC  Blood Culture adequate volume Performed at [place] [address] [place], [place] [address]    Culture   Final    NO GROWTH 1 DAY Performed at [facility], [place] [address], [place], [place] [address]    Report Status PENDING  Incomplete  SARS Coronavirus 2 (CEPHEID - Performed in [place] hospital lab), Hosp Order     Status: None   Collection Time: 09/08/18  3:58 AM  Result Value Ref Range Status   SARS Coronavirus 2 NEGATIVE NEGATIVE Final    Comment: (NOTE) If result is NEGATIVE SARS-CoV-2 target nucleic acids are NOT DETECTED. The SARS-CoV-2 RNA is generally detectable in upper and lower  respiratory specimens during the acute phase of infection. The lowest  concentration of SARS-CoV-2 viral copies this assay can detect is 250  copies / mL. A negative result does not preclude SARS-CoV-2 infection  and should not be used as the sole basis for treatment or other  patient management decisions.  A negative result may occur with  improper specimen collection / handling, submission of specimen other  than nasopharyngeal swab, presence of viral mutation(s) within the  areas targeted by this assay, and inadequate number of viral copies  (<250 copies / mL). A negative result must be combined with clinical  observations, patient history, and epidemiological information. If result is POSITIVE SARS-CoV-2 target nucleic acids are DETECTED. The SARS-CoV-2 RNA is generally detectable in upper and lower  respiratory specimens dur ing the acute phase of infection.  Positive  results are indicative of active infection with SARS-CoV-2.  Clinical  correlation with patient history and other diagnostic information is  necessary to determine patient infection status.  Positive results do  not rule out bacterial infection or co-infection with other viruses. If result is PRESUMPTIVE POSTIVE SARS-CoV-2 nucleic acids MAY BE PRESENT.   A presumptive positive result  was obtained on the submitted specimen  and confirmed on repeat testing.  While 2019 novel coronavirus  (SARS-CoV-2) nucleic acids may be present in the submitted sample  additional confirmatory testing may be necessary for epidemiological  and / or clinical management purposes  to differentiate between  SARS-CoV-2 and other Sarbecovirus currently known to infect humans.  If clinically indicated additional testing  with an alternate test  methodology [id number]) is advised. The SARS-CoV-2 RNA is generally  detectable in upper and lower respiratory sp ecimens during the acute  phase of infection. The expected result is Negative. Fact Sheet for Patients:  StrictlyIdeas.no Fact Sheet for Healthcare Providers: BankingDealers.co.za This test is not yet approved or cleared by the [place] FDA and has been authorized for detection and/or diagnosis of SARS-CoV-2 by FDA under an Emergency Use Authorization (EUA).  This EUA will remain in effect (meaning this test can be used) for the duration of the COVID-19 declaration under Section 564(b)(1) of the Act, 21 U.S.C. section 360bbb-3(b)(1), unless the authorization is terminated or revoked sooner. Performed at [facility], [place] [address], [place], [place] [address]   Blood Culture (routine x 2)     Status: None (Preliminary result)   Collection Time: 09/08/18  4:43 AM  Result Value Ref Range Status   Specimen Description   Final    BLOOD LEFT ANTECUBITAL Performed at [place] [address], [place], [place] [address]    Special Requests   Final    BOTTLES DRAWN AEROBIC AND ANAEROBIC Blood Culture adequate volume Performed at [place] [address] [place], [place] [address]    Culture   Final    NO GROWTH 1 DAY Performed at [facility], [place] [address] [place], [place] [address]    Report Status PENDING   Incomplete  Urine culture     Status: Abnormal   Collection Time: 09/08/18  8:25 AM  Result Value Ref Range Status   Specimen Description   Final    URINE, RANDOM Performed at [place] [address], [place], [place] [address]    Special Requests   Final    NONE Performed at [facility], [place] [address], [place], [place] [address]    Culture (A)  Final    <10,000 COLONIES/mL INSIGNIFICANT GROWTH Performed at [place] [address] [place], [place] [address]    Report Status 09/09/2018 FINAL  Final  MRSA PCR Screening     Status: None   Collection Time: 09/08/18  8:52 AM  Result Value Ref Range Status   MRSA by PCR NEGATIVE NEGATIVE Final    Comment:        The GeneXpert MRSA Assay (FDA approved for NASAL specimens only), is one component of a comprehensive MRSA colonization surveillance program. It is not intended to diagnose MRSA infection nor to guide or monitor treatment for MRSA infections. Performed at [facility], [place] [address] [address] [place], [place] [address]   Novel Coronavirus,NAA,(SEND-OUT TO REF LAB - TAT 24-48 hrs); Hosp Order     Status: None   Collection Time: 09/08/18 10:47 AM  Result Value Ref Range Status   SARS-CoV-2, NAA NOT DETECTED NOT DETECTED Final    Comment: (NOTE) This test was developed and its performance characteristics determined by [employer]. This test has not been FDA cleared or approved. This test has been authorized by FDA under an Emergency Use Authorization (EUA). This test is only authorized for the duration of time the declaration that circumstances exist justifying the authorization of the emergency use of in vitro diagnostic tests for detection of SARS-CoV-2 virus and/or diagnosis of COVID-19 infection under section 564(b)(1) of the Act, 21 U.S.C. 056PVX-4(I)(0), unless the authorization is terminated or revoked sooner. When diagnostic testing is  negative, the possibility of a false  negative result should be considered in the context of a patient's recent exposures and the presence of clinical signs and symptoms consistent with COVID-19. An individual without symptoms of COVID-19 and who is not shedding SARS-CoV-2 virus would expect to have a negative (not detected) result in this assay. Performed  At: [facility] [place], [place] [id number] Rush Farmer MD [id number]    [place]  Final    Comment: Performed at [facility] [address] [place], [place] [address]    Microbiology: Recent Results (from the past 240 hour(s))  Blood Culture (routine x 2)     Status: None (Preliminary result)   Collection Time: 09/08/18  3:58 AM  Result Value Ref Range Status   Specimen Description   Final    BLOOD RIGHT PORTA CATH Performed at [facility] [address] [place], [place] [address]    Special Requests   Final    BOTTLES DRAWN AEROBIC AND ANAEROBIC Blood Culture adequate volume Performed at [facility] [address], [place], [place] [address]    Culture   Final    NO GROWTH 1 DAY Performed at [facility], [place] [address], [place], [place] [address]    Report Status PENDING  Incomplete  SARS Coronavirus 2 (CEPHEID - Performed in [facility] hospital lab), Hosp Order     Status: None   Collection Time: 09/08/18  3:58 AM  Result Value Ref Range Status   SARS Coronavirus 2 NEGATIVE NEGATIVE Final    Comment: (NOTE) If result is NEGATIVE SARS-CoV-2 target nucleic acids are NOT DETECTED. The SARS-CoV-2 RNA is generally detectable in upper and lower  respiratory specimens during the acute phase of infection. The lowest  concentration of SARS-CoV-2 viral copies this assay can detect is 250  copies / mL. A negative result does not preclude SARS-CoV-2 infection  and should not be used as the sole basis for  treatment or other  patient management decisions.  A negative result may occur with  improper specimen collection / handling, submission of specimen other  than nasopharyngeal swab, presence of viral mutation(s) within the  areas targeted by this assay, and inadequate number of viral copies  (<250 copies / mL). A negative result must be combined with clinical  observations, patient history, and epidemiological information. If result is POSITIVE SARS-CoV-2 target nucleic acids are DETECTED. The SARS-CoV-2 RNA is generally detectable in upper and lower  respiratory specimens dur ing the acute phase of infection.  Positive  results are indicative of active infection with SARS-CoV-2.  Clinical  correlation with patient history and other diagnostic information is  necessary to determine patient infection status.  Positive results do  not rule out bacterial infection or co-infection with other viruses. If result is PRESUMPTIVE POSTIVE SARS-CoV-2 nucleic acids MAY BE PRESENT.   A presumptive positive result was obtained on the submitted specimen  and confirmed on repeat testing.  While 2019 novel coronavirus  (SARS-CoV-2) nucleic acids may be present in the submitted sample  additional confirmatory testing may be necessary for epidemiological  and / or clinical management purposes  to differentiate between  SARS-CoV-2 and other Sarbecovirus currently known to infect humans.  If clinically indicated additional testing with an alternate test  methodology [phone]) is advised. The SARS-CoV-2 RNA is generally  detectable in upper and lower respiratory sp ecimens during the acute  phase of infection. The expected result is Negative. Fact Sheet for Patients:  StrictlyIdeas.no Fact Sheet for Healthcare Providers: BankingDealers.co.za This test is not yet approved or cleared by the [place] FDA and has been authorized for detection and/or  diagnosis of SARS-CoV-2 by FDA under an Emergency Use Authorization (EUA).  This EUA will remain in effect (meaning this test can be used) for the duration of the COVID-19 declaration under Section 564(b)(1) of the Act, 21 U.S.C. section 360bbb-3(b)(1), unless the authorization is terminated or revoked sooner. Performed at [facility], [place] [address], [place], [place] [address]   Blood Culture (routine x 2)     Status: None (Preliminary result)   Collection Time: 09/08/18  4:43 AM  Result Value Ref Range Status   Specimen Description   Final    BLOOD LEFT ANTECUBITAL Performed at [facility] [address] [place], [place] [address]    Special Requests   Final    BOTTLES DRAWN AEROBIC AND ANAEROBIC Blood Culture adequate volume Performed at [facility] [address] [place], [place] [address]    Culture   Final    NO GROWTH 1 DAY Performed at [facility], [place] [address], [place], [place] [address]    Report Status PENDING  Incomplete  Urine culture     Status: Abnormal   Collection Time: 09/08/18  8:25 AM  Result Value Ref Range Status   Specimen Description   Final    URINE, RANDOM Performed at [facility] [address] [place], [place] [address]    Special Requests   Final    NONE Performed at [facility], [place] [address], [place], [place] [address]    Culture (A)  Final    <10,000 COLONIES/mL INSIGNIFICANT GROWTH Performed at [facility] [address] [place], [place] [address]    Report Status 09/09/2018 FINAL  Final  MRSA PCR Screening     Status: None   Collection Time: 09/08/18  8:52 AM  Result Value Ref Range Status   MRSA by PCR NEGATIVE NEGATIVE Final    Comment:        The GeneXpert MRSA Assay (FDA approved for NASAL specimens only), is one component of a comprehensive MRSA colonization surveillance program. It is not intended to  diagnose MRSA infection nor to guide or monitor treatment for MRSA infections. Performed at [facility], [place] [address] [place], [place] [address]   Novel Coronavirus,NAA,(SEND-OUT TO REF LAB - TAT 24-48 hrs); Hosp Order     Status: None   Collection Time: 09/08/18 10:47 AM  Result Value Ref Range Status   SARS-CoV-2, NAA NOT DETECTED NOT DETECTED Final    Comment: (NOTE) This test was developed and its performance characteristics determined by [employer]. This test has not been FDA cleared or approved. This test has been authorized by FDA under an Emergency Use Authorization (EUA). This test is only authorized for the duration of time the declaration that circumstances exist justifying the authorization of the emergency use of in vitro diagnostic tests for detection of SARS-CoV-2 virus and/or diagnosis of COVID-19 infection under section 564(b)(1) of the Act, 21 U.S.C. 244QKM-6(N)(8), unless the authorization is terminated or revoked sooner. When diagnostic testing is negative, the possibility of a false negative result should be considered in the context of a patient's recent exposures and the presence of clinical signs and symptoms consistent with COVID-19. An individual without symptoms of COVID-19 and who is not shedding SARS-CoV-2 virus would expect to  have a negative (not detected) result in this assay. Performed  At: [facility] [place], [place] [id number] Rush Farmer MD [id number]    [place]  Final    Comment: Performed at [facility] [address], [place], [place] [address]    Radiographs and labs were personally reviewed by me.   Bobby Rumpf, MD [facility] [phone] 09/10/2018, 3:50 PM

## 2018-09-10 NOTE — Progress Notes (Addendum)
Pt converted to A fib around 0134. Pt is asymptomatic, with blood pressure at 126/65 and a heart rate ranging from 100s to 130s. This nurse administered one time dose of 5 mg metoprolol IV push, 2.5 mg at a time. Pt heart rate ranges from 80s to 100s with a blood pressure of 114/69 but remains in a fib.  Luevenia Maxin, RN

## 2018-09-10 NOTE — Progress Notes (Signed)
Pharmacy: Re- heparin  Patient's a 68 y.o M with metastatic bladder cancer on heparin drip for suspected PE.  Not able to get CT scan at this time d/t renal insuff.  First heparin level now back sub-therapeutic at 0.27 (goal 0.3-0.7). - hgb remains low at 7. Pt received 1 unit PRBC today  Plan: - increase heparin drip to 1800 units/hr - check 6 hr heparin level - monitor for s/s bleeding  Dia Sitter, PharmD, BCPS 09/10/2018 9:14 PM

## 2018-09-11 ENCOUNTER — Inpatient Hospital Stay (HOSPITAL_COMMUNITY): Payer: PPO

## 2018-09-11 ENCOUNTER — Inpatient Hospital Stay: Payer: PPO

## 2018-09-11 ENCOUNTER — Inpatient Hospital Stay: Payer: PPO | Admitting: Hematology & Oncology

## 2018-09-11 DIAGNOSIS — R509 Fever, unspecified: Secondary | ICD-10-CM

## 2018-09-11 DIAGNOSIS — R0902 Hypoxemia: Secondary | ICD-10-CM

## 2018-09-11 DIAGNOSIS — R0989 Other specified symptoms and signs involving the circulatory and respiratory systems: Secondary | ICD-10-CM

## 2018-09-11 DIAGNOSIS — R918 Other nonspecific abnormal finding of lung field: Secondary | ICD-10-CM

## 2018-09-11 LAB — COMPREHENSIVE METABOLIC PANEL
ALT: 34 U/L (ref 0–44)
AST: 57 U/L — ABNORMAL HIGH (ref 15–41)
Albumin: 2.1 g/dL — ABNORMAL LOW (ref 3.5–5.0)
Alkaline Phosphatase: 81 U/L (ref 38–126)
Anion gap: 9 (ref 5–15)
BUN: 42 mg/dL — ABNORMAL HIGH (ref 8–23)
CO2: 22 mmol/L (ref 22–32)
Calcium: 8.3 mg/dL — ABNORMAL LOW (ref 8.9–10.3)
Chloride: 107 mmol/L (ref 98–111)
Creatinine, Ser: 1.78 mg/dL — ABNORMAL HIGH (ref 0.61–1.24)
GFR calc Af Amer: 45 mL/min — ABNORMAL LOW (ref >60)
GFR calc non Af Amer: 39 mL/min — ABNORMAL LOW (ref >60)
Glucose, Bld: 196 mg/dL — ABNORMAL HIGH (ref 70–99)
Potassium: 4.9 mmol/L (ref 3.5–5.1)
Sodium: 138 mmol/L (ref 135–145)
Total Bilirubin: 0.7 mg/dL (ref 0.3–1.2)
Total Protein: 5.1 g/dL — ABNORMAL LOW (ref 6.5–8.1)

## 2018-09-11 LAB — CBC WITH DIFFERENTIAL/PLATELET
Abs Immature Granulocytes: 0.04 10*3/uL (ref 0.00–0.07)
Basophils Absolute: 0 10*3/uL (ref 0.0–0.1)
Basophils Relative: 0 %
Eosinophils Absolute: 0 10*3/uL (ref 0.0–0.5)
Eosinophils Relative: 0 %
HCT: 26.1 % — ABNORMAL LOW (ref 39.0–52.0)
Hemoglobin: 8.2 g/dL — ABNORMAL LOW (ref 13.0–17.0)
Immature Granulocytes: 1 %
Lymphocytes Relative: 5 %
Lymphs Abs: 0.3 10*3/uL — ABNORMAL LOW (ref 0.7–4.0)
MCH: 30.6 pg (ref 26.0–34.0)
MCHC: 31.4 g/dL (ref 30.0–36.0)
MCV: 97.4 fL (ref 80.0–100.0)
Monocytes Absolute: 0.1 10*3/uL (ref 0.1–1.0)
Monocytes Relative: 1 %
Neutro Abs: 5.6 10*3/uL (ref 1.7–7.7)
Neutrophils Relative %: 93 %
Platelets: 93 10*3/uL — ABNORMAL LOW (ref 150–400)
RBC: 2.68 MIL/uL — ABNORMAL LOW (ref 4.22–5.81)
RDW: 16.8 % — ABNORMAL HIGH (ref 11.5–15.5)
WBC: 6 10*3/uL (ref 4.0–10.5)
nRBC: 0 % (ref 0.0–0.2)

## 2018-09-11 LAB — TYPE AND SCREEN
ABO/RH(D): A POS
Antibody Screen: NEGATIVE
Unit division: 0

## 2018-09-11 LAB — HEPARIN LEVEL (UNFRACTIONATED)
Heparin Unfractionated: 0.43 IU/mL (ref 0.30–0.70)
Heparin Unfractionated: 0.46 IU/mL (ref 0.30–0.70)

## 2018-09-11 LAB — BPAM RBC
Blood Product Expiration Date: 202006122359
ISSUE DATE / TIME: 202005311321
Unit Type and Rh: 6200

## 2018-09-11 LAB — GLUCOSE, CAPILLARY
Glucose-Capillary: 242 mg/dL — ABNORMAL HIGH (ref 70–99)
Glucose-Capillary: 207 mg/dL — ABNORMAL HIGH (ref 70–99)

## 2018-09-11 MED ORDER — INSULIN ASPART 100 UNIT/ML ~~LOC~~ SOLN: 0.0000 [IU] | Freq: Every day | SUBCUTANEOUS | Status: DC

## 2018-09-11 MED ORDER — INSULIN ASPART 100 UNIT/ML ~~LOC~~ SOLN
0.0000 [IU] | Freq: Three times a day (TID) | SUBCUTANEOUS | Status: DC
  Administered 2018-09-11 (×2): 7 [IU] via SUBCUTANEOUS

## 2018-09-11 MED ORDER — SULFAMETHOXAZOLE-TRIMETHOPRIM 400-80 MG/5ML IV SOLN
560.0000 mg | Freq: Three times a day (TID) | INTRAVENOUS | Status: DC
  Administered 2018-09-11 – 2018-09-13 (×6): 560 mg via INTRAVENOUS
  Filled 2018-09-11 (×9): qty 35

## 2018-09-11 NOTE — Progress Notes (Signed)
Pharmacy: Re- heparin  Patient's a 68 y.o M with metastatic bladder cancer on heparin drip for suspected PE.  Not able to get CT scan at this time d/t renal insuff.  0419 HL = 0.43 at goal (goal 0.3-0.7). - hgb remains low at 7. Pt received 1 unit PRBC5/31 - no infusion issues or bleeding reported by RN Plan: -continue heparin drip at 18 ml/hr - recheck a confirmatory HL today - monitor for s/s bleeding  Dorrene German 09/11/2018 5:00 AM

## 2018-09-11 NOTE — Progress Notes (Signed)
PROGRESS NOTE    [name] Rose  ZOX:[id number] DOB: September 25, 1950 DOA: 09/08/2018 PCP: Shirline Frees, MD    Brief Narrative:  68 year old gentleman with history of hypertension, hyperlipidemia, prediabetes, prostate cancer, metastasized bladder cancer on chemotherapy, paroxysmal A. fib, CKD stage III, recent MSSA bacteremia who was admitted to the hospital secondary to significant weakness and shortness of breath after receiving chemotherapy about 1 week ago.  In the emergency room, patient was afebrile, tachypneic, blood pressures 80/50 improved with oxygenation.  Chest x-ray significant for diffuse interstitial groundglass opacities concerning for atypical pneumonia.  Treated with IV fluid boluses and IV antibiotics and admitted to the stepdown unit. Patient remained significantly hypoxic all throughout. 09/10/2018: CT chest shows atypical pneumonia patterns and ARDS.  Continues to need high flow oxygen.  Empirically treated with heparin because of significant elevated d-dimer.  Infectious disease added Bactrim. PCCM consulted.   Assessment & Plan:   Principal Problem:   Pneumonia Active Problems:   LFT elevation   AKI (acute kidney injury) ([place])   CKD (chronic kidney disease), stage III (HCC)   Acute respiratory failure with hypoxia (HCC)   Atypical pneumonia  Acute hypoxic respiratory failure: No previous oxygen use.  Suspect due to atypical pneumonia in a patient with receiving chemotherapy. Aggressive chest physiotherapy and bronchodilator therapy. On high flow oxygen and nonrebreather.  Still requiring significant oxygen.  Followed by PCCM.  Presumed pulmonary embolism: Significant new onset hypoxia D-dimer, 1.73-19 and 24 hours Lower extremity duplexes negative Cannot have CT scan with contrast because of CKD Cannot have VQ scan on high flow oxygen and COVID-19 logistic limitations. High pretest probability of thromboembolism, treating as PE.  Follow-up CT with contrast  once renal function improved.  Bacterial pneumonia: Was treated with vancomycin and cefepime, MRSA negative, vancomycin discontinued.  Continue on cefepime until clinical improvement.  Chest physiotherapy, incentive spirometry, deep breathing exercises, sputum induction, mucolytic's and bronchodilators. All cultures are negative. Very atypical looking pneumonia possible on immunocompromised host.  Added Bactrim.  COVID-19: Ruled out with 2- results.  Acute kidney injury on chronic kidney disease stage III: Treated with IV fluids with improvement of creatinine.  At about baseline level of 1.7-1.8.  Acute on chronic anemia of chronic disease: Hemoglobin was 7 today.  Patient with hypoxia and tachycardia not improved with conventional treatment.  Transfuse 1 unit of PRBC with improvement of hemoglobin to 8.2.  Paroxysmal A. fib: Remains tachycardic.  Resumed metoprolol and diltiazem.  PRN metoprolol.  Better today.  Metastatic bladder cancer with prostate cancer currently on chemotherapy: Followed by oncology.  History of hypertension: Blood pressure medications on hold.  Resuming Cardizem and metoprolol for tachycardia.  Hypokalemia: Replaced aggressively.  Monitor levels.   DVT prophylaxis: Heparin IV Code Status: Full code Family Communication: Patient's wife updated. Disposition Plan: Stepdown unit   Consultants:   Oncology  PCCM  Procedures:   None  Antimicrobials:   Vancomycin, 09/08/2018--09/09/2018  Cefepime, 09/08/2018----  Bactrim, 09/10/2018   Subjective: Patient seen and examined.  Remained on 100% oxygen.  No other overnight events.  He can be okay when he is laying still, however feels exhausted on slight movement.  Afebrile overnight.  Objective: Vitals:   09/11/18 0700 09/11/18 0800 09/11/18 0818 09/11/18 0900  BP: (!) 88/63 114/77 114/77 126/68  Pulse: 66 73 83 85  Resp: 18  (!) 22 (!) 22  Temp:  (!) 96.7 F (35.9 C)    TempSrc:  Axillary    SpO2:  95% 100% 95% 99%  Weight:      Height:        Intake/Output Summary (Last 24 hours) at 09/11/2018 1005 Last data filed at 09/11/2018 0805 Gross per 24 hour  Intake 960.07 ml  Output 950 ml  Net 10.07 ml   Filed Weights   09/08/18 0545  Weight: 113.4 kg    Examination:  General exam: Appears anxious with moderate distress on high flow oxygen.  Chronically sick looking. Respiratory system: Bilateral poor air entry with no added sounds.  Port on right chest clean and dry. Cardiovascular system: S1 & S2 heard, irregularly irregular and tachycardic.  No pedal edema. Gastrointestinal system: Abdomen is distended but soft.  nontender. No organomegaly or masses felt. Normal bowel sounds heard. Central nervous system: Alert and oriented. No focal neurological deficits. Extremities: Symmetric 5 x 5 power. Skin: No rashes, lesions or ulcers Psychiatry: Judgement and insight appear normal. Mood & affect anxious.    Data Reviewed: I have personally reviewed following labs and imaging studies  CBC: Recent Labs  Lab 09/08/18 0255 09/09/18 0410 09/10/18 0240 09/11/18 0301  WBC 8.8 10.2 7.0 6.0  NEUTROABS 7.8* 9.5* 6.0 5.6  HGB 7.8* 7.1* 7.0* 8.2*  HCT 23.0* 22.1* 22.0* 26.1*  MCV 93.5 95.3 96.1 97.4  PLT 90* 94* 91* 93*   Basic Metabolic Panel: Recent Labs  Lab 09/08/18 0255 09/08/18 0312 09/08/18 0452 09/09/18 0410 09/10/18 0240 09/11/18 0301  NA 132*  --   --  137 136 138  K 3.3*  --   --  3.2* 3.1* 4.9  CL 99  --   --  106 104 107  CO2 22  --   --  $R'22 22 22  'll$ GLUCOSE 194*  --   --  129* 125* 196*  BUN 40*  --   --  38* 36* 42*  CREATININE 2.26*  --  2.30* 1.95* 1.74* 1.78*  CALCIUM 8.0*  --   --  7.9* 8.1* 8.3*  MG  --  1.7  --   --   --   --    GFR: Estimated Creatinine Clearance: 53.9 mL/min (A) (by C-G formula based on SCr of 1.78 mg/dL (H)). Liver Function Tests: Recent Labs  Lab 09/08/18 0255 09/09/18 0410 09/10/18 0240 09/11/18 0301  AST 66* 79* 64* 57*    ALT 47* 39 36 34  ALKPHOS 45 47 52 81  BILITOT 0.8 0.6 0.8 0.7  PROT 5.2* 5.0* 4.8* 5.1*  ALBUMIN 2.5* 2.3* 2.1* 2.1*   No results for input(s): LIPASE, AMYLASE in the last 168 hours. No results for input(s): AMMONIA in the last 168 hours. Coagulation Profile: No results for input(s): INR, PROTIME in the last 168 hours. Cardiac Enzymes: Recent Labs  Lab 09/08/18 0255 09/08/18 0312  CKTOTAL 9*  --   TROPONINI  --  0.03*   BNP (last 3 results) No results for input(s): PROBNP in the last 8760 hours. HbA1C: No results for input(s): HGBA1C in the last 72 hours. CBG: Recent Labs  Lab 09/05/18 0704 09/05/18 0841  GLUCAP 171* 171*   Lipid Profile: Recent Labs    09/08/18 1047  TRIG 94   Thyroid Function Tests: No results for input(s): TSH, T4TOTAL, FREET4, T3FREE, THYROIDAB in the last 72 hours. Anemia Panel: Recent Labs    09/08/18 1047  FERRITIN 3,307*   Sepsis Labs: Recent Labs  Lab 09/08/18 0358 09/08/18 1047  PROCALCITON  --  1.72  LATICACIDVEN 1.1  --     Recent Results (from  the past 240 hour(s))  Blood Culture (routine x 2)     Status: None (Preliminary result)   Collection Time: 09/08/18  3:58 AM  Result Value Ref Range Status   Specimen Description   Final    BLOOD RIGHT PORTA CATH Performed at [facility] [address], [place], [place] [address]    Special Requests   Final    BOTTLES DRAWN AEROBIC AND ANAEROBIC Blood Culture adequate volume Performed at [facility] [address] [place], [place] [address]    Culture   Final    NO GROWTH 3 DAYS Performed at [facility], [place] [address] [place], [place] [address]    Report Status PENDING  Incomplete  SARS Coronavirus 2 (CEPHEID - Performed in [facility] hospital lab), Hosp Order     Status: None   Collection Time: 09/08/18  3:58 AM  Result Value Ref Range Status   SARS Coronavirus 2 NEGATIVE NEGATIVE Final    Comment: (NOTE) If result is  NEGATIVE SARS-CoV-2 target nucleic acids are NOT DETECTED. The SARS-CoV-2 RNA is generally detectable in upper and lower  respiratory specimens during the acute phase of infection. The lowest  concentration of SARS-CoV-2 viral copies this assay can detect is 250  copies / mL. A negative result does not preclude SARS-CoV-2 infection  and should not be used as the sole basis for treatment or other  patient management decisions.  A negative result may occur with  improper specimen collection / handling, submission of specimen other  than nasopharyngeal swab, presence of viral mutation(s) within the  areas targeted by this assay, and inadequate number of viral copies  (<250 copies / mL). A negative result must be combined with clinical  observations, patient history, and epidemiological information. If result is POSITIVE SARS-CoV-2 target nucleic acids are DETECTED. The SARS-CoV-2 RNA is generally detectable in upper and lower  respiratory specimens dur ing the acute phase of infection.  Positive  results are indicative of active infection with SARS-CoV-2.  Clinical  correlation with patient history and other diagnostic information is  necessary to determine patient infection status.  Positive results do  not rule out bacterial infection or co-infection with other viruses. If result is PRESUMPTIVE POSTIVE SARS-CoV-2 nucleic acids MAY BE PRESENT.   A presumptive positive result was obtained on the submitted specimen  and confirmed on repeat testing.  While 2019 novel coronavirus  (SARS-CoV-2) nucleic acids may be present in the submitted sample  additional confirmatory testing may be necessary for epidemiological  and / or clinical management purposes  to differentiate between  SARS-CoV-2 and other Sarbecovirus currently known to infect humans.  If clinically indicated additional testing with an alternate test  methodology [phone]) is advised. The SARS-CoV-2 RNA is generally  detectable  in upper and lower respiratory sp ecimens during the acute  phase of infection. The expected result is Negative. Fact Sheet for Patients:  StrictlyIdeas.no Fact Sheet for Healthcare Providers: BankingDealers.co.za This test is not yet approved or cleared by the [place] FDA and has been authorized for detection and/or diagnosis of SARS-CoV-2 by FDA under an Emergency Use Authorization (EUA).  This EUA will remain in effect (meaning this test can be used) for the duration of the COVID-19 declaration under Section 564(b)(1) of the Act, 21 U.S.C. section 360bbb-3(b)(1), unless the authorization is terminated or revoked sooner. Performed at [facility], [place] [address], [place], [place] [address]   Blood Culture (routine x  2)     Status: None (Preliminary result)   Collection Time: 09/08/18  4:43 AM  Result Value Ref Range Status   Specimen Description   Final    BLOOD LEFT ANTECUBITAL Performed at [facility] [address] [place], [place] [address]    Special Requests   Final    BOTTLES DRAWN AEROBIC AND ANAEROBIC Blood Culture adequate volume Performed at [facility] [address], [place], [place] [address]    Culture   Final    NO GROWTH 3 DAYS Performed at [facility], [place] [address] [place], [place] [address]    Report Status PENDING  Incomplete  Urine culture     Status: Abnormal   Collection Time: 09/08/18  8:25 AM  Result Value Ref Range Status   Specimen Description   Final    URINE, RANDOM Performed at [facility] [address] [place], [place] [address]    Special Requests   Final    NONE Performed at [facility], [place] [address], [place], [place] [address]    Culture (A)  Final    <10,000 COLONIES/mL INSIGNIFICANT GROWTH Performed at [facility] [address] [place], [place] [address]      Report Status 09/09/2018 FINAL  Final  MRSA PCR Screening     Status: None   Collection Time: 09/08/18  8:52 AM  Result Value Ref Range Status   MRSA by PCR NEGATIVE NEGATIVE Final    Comment:        The GeneXpert MRSA Assay (FDA approved for NASAL specimens only), is one component of a comprehensive MRSA colonization surveillance program. It is not intended to diagnose MRSA infection nor to guide or monitor treatment for MRSA infections. Performed at [facility], [place] [address] [place], [place] [address]   Novel Coronavirus,NAA,(SEND-OUT TO REF LAB - TAT 24-48 hrs); Hosp Order     Status: None   Collection Time: 09/08/18 10:47 AM  Result Value Ref Range Status   SARS-CoV-2, NAA NOT DETECTED NOT DETECTED Final    Comment: (NOTE) This test was developed and its performance characteristics determined by [employer]. This test has not been FDA cleared or approved. This test has been authorized by FDA under an Emergency Use Authorization (EUA). This test is only authorized for the duration of time the declaration that circumstances exist justifying the authorization of the emergency use of in vitro diagnostic tests for detection of SARS-CoV-2 virus and/or diagnosis of COVID-19 infection under section 564(b)(1) of the Act, 21 U.S.C. 092ZRA-0(T)(6), unless the authorization is terminated or revoked sooner. When diagnostic testing is negative, the possibility of a false negative result should be considered in the context of a patient's recent exposures and the presence of clinical signs and symptoms consistent with COVID-19. An individual without symptoms of COVID-19 and who is not shedding SARS-CoV-2 virus would expect to have a negative (not detected) result in this assay. Performed  At: [facility] [place], [place] [id number] Rush Farmer MD GY:[id number]    [place]  Final    Comment: Performed  at [facility] [address], [place], [place] [address]         Radiology Studies: Ct Chest Wo Contrast  Result Date: 09/10/2018 CLINICAL DATA:  Severe shortness of breath. EXAM: CT CHEST WITHOUT CONTRAST TECHNIQUE: Multidetector CT imaging of the chest was performed following the standard protocol without  IV contrast. COMPARISON:  Single-view of the chest 09/08/2018. PA and lateral chest 05/14/2017. PET CT scan 09/05/2018. FINDINGS: Cardiovascular: There is mild cardiomegaly. Calcific aortic and coronary atherosclerosis noted. No pericardial effusion. Mediastinum/Nodes: No enlarged mediastinal or axillary lymph nodes. Thyroid gland, trachea, and esophagus demonstrate no significant findings. Lungs/Pleura: Extensive ground-glass attenuation is present throughout the lungs with inter and intralobular septal thickening. Small areas of sparing in the superior segment of the right lower lobe and posterior left lower lobe noted. No nodule or mass. No pleural effusion. Upper Abdomen: There is fatty infiltration of the visualized liver. Musculoskeletal: No acute or focal abnormality. IMPRESSION: Diffusely abnormal appearance of the lungs is nonspecific and can be seen in bacterial pneumonia, acute interstitial pneumonia, ARDS among other causes. Cardiomegaly. Fatty infiltration of the liver. Aortic Atherosclerosis (ICD10-I70.0). Electronically Signed   By: Inge Rise M.D.   On: 09/10/2018 12:56   Dg Chest Port 1 View  Result Date: 09/11/2018 CLINICAL DATA:  Respiratory failure EXAM: PORTABLE CHEST 1 VIEW COMPARISON:  09/08/18 FINDINGS: Cardiac shadow is mildly prominent. Right-sided chest wall port is seen in satisfactory position. Diffuse opacities are noted throughout both lungs similar to that seen on prior CT examination. No new focal abnormality is noted. IMPRESSION: Stable bilateral infiltrates similar to that seen on recent CT. Electronically Signed   By: Inez Catalina M.D.    On: 09/11/2018 07:18   Vas [place] Lower Extremity Venous (dvt)  Result Date: 09/10/2018  Lower Venous Study Indications: SOB, and pulmonary embolism.  Performing Technologist: Abram Sander RVS  Examination Guidelines: A complete evaluation includes B-mode imaging, spectral Doppler, color Doppler, and power Doppler as needed of all accessible portions of each vessel. Bilateral testing is considered an integral part of a complete examination. Limited examinations for reoccurring indications may be performed as noted.  +---------+---------------+---------+-----------+----------+-------+  RIGHT     Compressibility Phasicity Spontaneity Properties Summary  +---------+---------------+---------+-----------+----------+-------+  CFV       Full            Yes       Yes                             +---------+---------------+---------+-----------+----------+-------+  SFJ       Full                                                      +---------+---------------+---------+-----------+----------+-------+  FV Prox   Full                                                      +---------+---------------+---------+-----------+----------+-------+  FV Mid    Full                                                      +---------+---------------+---------+-----------+----------+-------+  FV Distal Full                                                      +---------+---------------+---------+-----------+----------+-------+  PFV       Full                                                      +---------+---------------+---------+-----------+----------+-------+  POP       Full            Yes       Yes                             +---------+---------------+---------+-----------+----------+-------+  PTV       Full                                                      +---------+---------------+---------+-----------+----------+-------+  PERO      Full                                                       +---------+---------------+---------+-----------+----------+-------+   +---------+---------------+---------+-----------+----------+--------------+  LEFT      Compressibility Phasicity Spontaneity Properties Summary         +---------+---------------+---------+-----------+----------+--------------+  CFV       Full            Yes       Yes                                    +---------+---------------+---------+-----------+----------+--------------+  SFJ       Full                                                             +---------+---------------+---------+-----------+----------+--------------+  FV Prox   Full                                                             +---------+---------------+---------+-----------+----------+--------------+  FV Mid    Full                                                             +---------+---------------+---------+-----------+----------+--------------+  FV Distal Full                                                             +---------+---------------+---------+-----------+----------+--------------+  PFV       Full                                                             +---------+---------------+---------+-----------+----------+--------------+  POP       Full            Yes       Yes                                    +---------+---------------+---------+-----------+----------+--------------+  PTV       Full                                                             +---------+---------------+---------+-----------+----------+--------------+  PERO                                                       Not visualized  +---------+---------------+---------+-----------+----------+--------------+     Summary: Right: There is no evidence of deep vein thrombosis in the lower extremity. No cystic structure found in the popliteal fossa. Left: There is no evidence of deep vein thrombosis in the lower extremity. No cystic structure found in the popliteal fossa.  *See table(s)  above for measurements and observations. Electronically signed by Monica Martinez MD on 09/10/2018 at 3:05:06 PM.    Final         Scheduled Meds:  Chlorhexidine Gluconate Cloth  6 each Topical Daily   diltiazem  240 mg Oral Daily   gabapentin  300 mg Oral TID   insulin aspart  0-20 Units Subcutaneous TID WC   insulin aspart  0-5 Units Subcutaneous QHS   magnesium oxide  400 mg Oral Daily   mouth rinse  15 mL Mouth Rinse BID   methylPREDNISolone (SOLU-MEDROL) injection  80 mg Intravenous Q6H   metoprolol tartrate  25 mg Oral BID   potassium chloride  40 mEq Oral BID   vitamin B-6  300 mg Oral Daily   senna  1 tablet Oral BID   Continuous Infusions:  azithromycin Stopped (09/10/18 2108)   ceFEPime (MAXIPIME) IV Stopped (09/11/18 0835)   heparin 1,800 Units/hr (09/11/18 0805)   sulfamethoxazole-trimethoprim       LOS: 3 days    Time spent: 35 minutes    Barb Merino, MD [facility] Pager [phone]  If 7PM-7AM, please contact night-coverage [email or web address] Password [email or web address] 09/11/2018, 10:05 AM

## 2018-09-11 NOTE — Progress Notes (Signed)
[name] Rose for IV heparin Indication: presumed PE  No Known Allergies  Patient Measurements: Height: 6\' 2"  (188 cm) Weight: 250 lb (113.4 kg) IBW/kg (Calculated) : 82.2 Heparin Dosing Weight: 105.8  Vital Signs: Temp: 96.7 F (35.9 C) (06/01 0800) Temp Source: Axillary (06/01 0800) BP: 116/71 (06/01 1200) Pulse Rate: 83 (06/01 1200)  Labs: Recent Labs    09/09/18 0410 09/10/18 0240 09/10/18 2026 09/11/18 0301 09/11/18 1056  HGB 7.1* 7.0*  --  8.2*  --   HCT 22.1* 22.0*  --  26.1*  --   PLT 94* 91*  --  93*  --   HEPARINUNFRC  --   --  0.27* 0.43 0.46  CREATININE 1.95* 1.74*  --  1.78*  --     Estimated Creatinine Clearance: 53.9 mL/min (A) (by C-G formula based on SCr of 1.78 mg/dL (H)).  Assessment: 68 yo with HAP, metastatic bladder cancer on chemo with last cycle completed on 5/18. Now to start IV heparin per Md orders for presumed PE to confirm via CT. Hgb low at 7 but stable since admission. Plts also low at 91k but stable since admission. SCr 1.74 which is trending down est CrCl 55 ml/min. No anticoagulant prescribed PTA  09/11/2018  Heparin level remains therapeutic at 0.46 on 1800 units/hr Hg 7> 8.2 after 1 U PRBC transfused 5/31. PLTC remains low at 93.  No bleeding reported.   Goal of Therapy:  Heparin level 0.3-0.7 units/ml Monitor platelets by anticoagulation protocol: Yes   Plan:  Continue heparin at 1800 units/hr  Daily CBC and heparin level  Eudelia Bunch, Pharm.D [phone] 09/11/2018 12:35 PM

## 2018-09-11 NOTE — Progress Notes (Signed)
Patient ID: [name] Rose, male   DOB: 11-28-1950, 68 y.o.   MRN: [id number]         [facility]  Date of Admission:  09/08/2018           Day 5 cefepime        Day 2 azithromycin        Day 1 trimethoprim sulfamethoxazole ASSESSMENT: The cause of his recent fever, pulmonary infiltrates and hypoxia remains unclear.  Screening for COVID-19 and has been negative x2.  Blood cultures are negative.  MRSA PCR is negative.  He has defervesced on broad empiric antibiotic therapy and anticoagulation.  PLAN: 1. Continue current antibiotics for now 2. Watch closely for hyperkalemia and worsening renal function on trimethoprim sulfamethoxazole  Principal Problem:   Pneumonia Active Problems:   Acute respiratory failure with hypoxia (HCC)   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 (HCC)   LFT elevation   AKI (acute kidney injury) ([place])   CKD (chronic kidney disease), stage III (HCC)   Scheduled Meds: . Chlorhexidine Gluconate Cloth  6 each Topical Daily  . diltiazem  240 mg Oral Daily  . gabapentin  300 mg Oral TID  . insulin aspart  0-20 Units Subcutaneous TID WC  . insulin aspart  0-5 Units Subcutaneous QHS  . magnesium oxide  400 mg Oral Daily  . mouth rinse  15 mL Mouth Rinse BID  . methylPREDNISolone (SOLU-MEDROL) injection  80 mg Intravenous Q6H  . metoprolol tartrate  25 mg Oral BID  . potassium chloride  40 mEq Oral BID  . vitamin B-6  300 mg Oral Daily  . senna  1 tablet Oral BID   Continuous Infusions: . azithromycin Stopped (09/10/18 2108)  . ceFEPime (MAXIPIME) IV Stopped (09/11/18 0835)  . heparin 1,800 Units/hr (09/11/18 1553)  . sulfamethoxazole-trimethoprim Stopped (09/11/18 1350)   PRN Meds:.acetaminophen **OR** acetaminophen, albuterol, LORazepam, metoprolol tartrate, ondansetron **OR** ondansetron (ZOFRAN) IV, phenol   SUBJECTIVE: He remains quite dyspneic and hypoxic requiring high flow, supplemental mask O2.  He  states that he feels about the same as when he was admitted 5 days ago.  He has a very mild, infrequent dry cough.  Review of Systems: Review of Systems  Constitutional: Negative for chills, diaphoresis and fever.  Respiratory: Positive for cough and shortness of breath. Negative for hemoptysis, sputum production and wheezing.   Cardiovascular: Negative for chest pain.  Gastrointestinal: Negative for abdominal pain, diarrhea, nausea and vomiting.    No Known Allergies  OBJECTIVE: Vitals:   09/11/18 1311 09/11/18 1400 09/11/18 1500 09/11/18 1600  BP: 121/64 (!) 111/56 116/62 106/73  Pulse: 85 76 74 61  Resp: (!) 40 (!) 36 (!) 31 (!) 36  Temp:      TempSrc:      SpO2: 92% 94% 100% 95%  Weight:      Height:       Body mass index is 32.1 kg/m.  Physical Exam Constitutional:      Comments: He is alert and resting quietly in bed.  Cardiovascular:     Rate and Rhythm: Regular rhythm.     Heart sounds: No murmur.     Comments: Tachycardia. Pulmonary:     Breath sounds: Rales present.     Comments: He has slight increased work of breathing while talking.  He has scattered crackles bilaterally. Psychiatric:        Mood and Affect: Mood normal.     Lab Results Lab Results  Component Value Date   WBC 6.0 09/11/2018   HGB 8.2 (L) 09/11/2018   HCT 26.1 (L) 09/11/2018   MCV 97.4 09/11/2018   PLT 93 (L) 09/11/2018    Lab Results  Component Value Date   CREATININE 1.78 (H) 09/11/2018   BUN 42 (H) 09/11/2018   NA 138 09/11/2018   K 4.9 09/11/2018   CL 107 09/11/2018   CO2 22 09/11/2018    Lab Results  Component Value Date   ALT 34 09/11/2018   AST 57 (H) 09/11/2018   ALKPHOS 81 09/11/2018   BILITOT 0.7 09/11/2018     Microbiology: Recent Results (from the past 240 hour(s))  Blood Culture (routine x 2)     Status: None (Preliminary result)   Collection Time: 09/08/18  3:58 AM  Result Value Ref Range Status   Specimen Description   Final    BLOOD RIGHT PORTA CATH  Performed at [facility], [place] [address], [place], [place] [address]    Special Requests   Final    BOTTLES DRAWN AEROBIC AND ANAEROBIC Blood Culture adequate volume Performed at [facility] [address], [place], [place] [address]    Culture   Final    NO GROWTH 3 DAYS Performed at [facility], [place] [address], [place], [place] [address]    Report Status PENDING  Incomplete  SARS Coronavirus 2 (CEPHEID - Performed in [place] hospital lab), Hosp Order     Status: None   Collection Time: 09/08/18  3:58 AM  Result Value Ref Range Status   SARS Coronavirus 2 NEGATIVE NEGATIVE Final    Comment: (NOTE) If result is NEGATIVE SARS-CoV-2 target nucleic acids are NOT DETECTED. The SARS-CoV-2 RNA is generally detectable in upper and lower  respiratory specimens during the acute phase of infection. The lowest  concentration of SARS-CoV-2 viral copies this assay can detect is 250  copies / mL. A negative result does not preclude SARS-CoV-2 infection  and should not be used as the sole basis for treatment or other  patient management decisions.  A negative result may occur with  improper specimen collection / handling, submission of specimen other  than nasopharyngeal swab, presence of viral mutation(s) within the  areas targeted by this assay, and inadequate number of viral copies  (<250 copies / mL). A negative result must be combined with clinical  observations, patient history, and epidemiological information. If result is POSITIVE SARS-CoV-2 target nucleic acids are DETECTED. The SARS-CoV-2 RNA is generally detectable in upper and lower  respiratory specimens dur ing the acute phase of infection.  Positive  results are indicative of active infection with SARS-CoV-2.  Clinical  correlation with patient history and other diagnostic information is  necessary to determine patient infection status.  Positive results do  not rule out  bacterial infection or co-infection with other viruses. If result is PRESUMPTIVE POSTIVE SARS-CoV-2 nucleic acids MAY BE PRESENT.   A presumptive positive result was obtained on the submitted specimen  and confirmed on repeat testing.  While 2019 novel coronavirus  (SARS-CoV-2) nucleic acids may be present in the submitted sample  additional confirmatory testing may be necessary for epidemiological  and / or clinical management purposes  to differentiate between  SARS-CoV-2 and other Sarbecovirus currently known to infect humans.  If clinically indicated additional testing with an alternate test  methodology [phone]) is advised. The SARS-CoV-2 RNA is generally  detectable in upper and lower respiratory sp ecimens during the  acute  phase of infection. The expected result is Negative. Fact Sheet for Patients:  StrictlyIdeas.no Fact Sheet for Healthcare Providers: BankingDealers.co.za This test is not yet approved or cleared by the [place] FDA and has been authorized for detection and/or diagnosis of SARS-CoV-2 by FDA under an Emergency Use Authorization (EUA).  This EUA will remain in effect (meaning this test can be used) for the duration of the COVID-19 declaration under Section 564(b)(1) of the Act, 21 U.S.C. section 360bbb-3(b)(1), unless the authorization is terminated or revoked sooner. Performed at [facility], [place] [address] [place], [place] [address]   Blood Culture (routine x 2)     Status: None (Preliminary result)   Collection Time: 09/08/18  4:43 AM  Result Value Ref Range Status   Specimen Description   Final    BLOOD LEFT ANTECUBITAL Performed at [facility] [address], [place], [place] [address]    Special Requests   Final    BOTTLES DRAWN AEROBIC AND ANAEROBIC Blood Culture adequate volume Performed at [facility] [address] [place],  [place] [address]    Culture   Final    NO GROWTH 3 DAYS Performed at [facility], [place] [address] [place], [place] [address]    Report Status PENDING  Incomplete  Urine culture     Status: Abnormal   Collection Time: 09/08/18  8:25 AM  Result Value Ref Range Status   Specimen Description   Final    URINE, RANDOM Performed at [facility] [address] [place], [place] [address]    Special Requests   Final    NONE Performed at [facility], [place] [address] [place], [place] [address]    Culture (A)  Final    <10,000 COLONIES/mL INSIGNIFICANT GROWTH Performed at [place] [address] [place], [place] [address]    Report Status 09/09/2018 FINAL  Final  MRSA PCR Screening     Status: None   Collection Time: 09/08/18  8:52 AM  Result Value Ref Range Status   MRSA by PCR NEGATIVE NEGATIVE Final    Comment:        The GeneXpert MRSA Assay (FDA approved for NASAL specimens only), is one component of a comprehensive MRSA colonization surveillance program. It is not intended to diagnose MRSA infection nor to guide or monitor treatment for MRSA infections. Performed at [facility], [place] [address], [place], [place] [address]   Novel Coronavirus,NAA,(SEND-OUT TO REF LAB - TAT 24-48 hrs); Hosp Order     Status: None   Collection Time: 09/08/18 10:47 AM  Result Value Ref Range Status   SARS-CoV-2, NAA NOT DETECTED NOT DETECTED Final    Comment: (NOTE) This test was developed and its performance characteristics determined by [employer]. This test has not been FDA cleared or approved. This test has been authorized by FDA under an Emergency Use Authorization (EUA). This test is only authorized for the duration of time the declaration that circumstances exist justifying the authorization of the emergency use of in vitro diagnostic tests for detection of SARS-CoV-2 virus and/or diagnosis of COVID-19  infection under section 564(b)(1) of the Act, 21 U.S.C. 220URK-2(H)(0), unless the authorization is terminated or revoked sooner. When diagnostic testing is negative, the possibility of a false negative result should be considered in the context of a patient's recent exposures and the presence of clinical signs and symptoms consistent with COVID-19. An  individual without symptoms of COVID-19 and who is not shedding SARS-CoV-2 virus would expect to have a negative (not detected) result in this assay. Performed  At: [facility] [place], [place] [id number] Rush Farmer MD EN:[id number]    [place]  Final    Comment: Performed at [place] [address] [place], [place] [address]    Michel Bickers, [place] [facility] [phone] pager   [phone] cell 09/11/2018, 4:34 PM

## 2018-09-11 NOTE — Progress Notes (Signed)
Thankfully, he is negative for the coronavirus.  His Dopplers of the legs were also negative for any obvious thromboembolic disease.  However, he is on empiric therapy with IV heparin given his elevated d-dimer.  He is being followed by infectious disease.  I suppose that the pulmonary issues could be from the Krebs but the passive is working for him.  I am not willing to give up on this as of yet.  He did get transfused.  His hemoglobin is 8.2.  He did get a dose of Aranesp.  His renal function is stable.  His creatinine is 1.78.  Potassium 4.9.  Calcium 8.3 with an albumin of only 2.1.  His oxygen level is 95%.  He is on a facemask oxygen.  Blood pressure is 103/44.  Pulse is 102.  He is afebrile.  He had a chest x-ray today.  The final report is not back yet.  I think it would be nice to try to check him for pulmonary emboli.  I does not know how we can do that without potentially worsening his kidney function with a CT angiogram.  I probably would just keep him on the heparin for right now.  I know that he is getting great care from everybody down in the ICU.  I appreciate everybody's help with him.  [name], MD  [facility] 12:6

## 2018-09-11 NOTE — Progress Notes (Addendum)
Pharmacy Antibiotic Note  [name] Rose is a 68 y.o. male admitted on 09/08/2018 with pneumonia.  Pharmacy has been consulted for TMP/SMZ dosing. Patient has met bladder cancer on enfortumab.  Patient presents with hypoxia and fever. He was started on cefepime with ID adding azithromycin 5/31.  PCCM adding TMP/SMZ 6/1 for possible Pneumocystis pneumonia.   Today, 09/11/2018  D4 cefepime D2 azith  SCr - improving but elevated w/ h/o CKD  K = 4.9  Plan:  TMP/SMZ 583m IV q8h (total daily dose = 158mkg)  Watch renal function and K+ closely - will need to check labs daily for now  Note - Some Mfg of cefepime have shown to  contain beta-D glucan and interfere with fungitell lab assay  Height: '6\' 2"'  (188 cm) Weight: 250 lb (113.4 kg) IBW/kg (Calculated) : 82.2  Temp (24hrs), Avg:97.8 F (36.6 C), Min:96.7 F (35.9 C), Max:98.7 F (37.1 C)  Recent Labs  Lab 09/08/18 0255 09/08/18 0358 09/08/18 0452 09/09/18 0410 09/10/18 0240 09/11/18 0301  WBC 8.8  --   --  10.2 7.0 6.0  CREATININE 2.26*  --  2.30* 1.95* 1.74* 1.78*  LATICACIDVEN  --  1.1  --   --   --   --     Estimated Creatinine Clearance: 53.9 mL/min (A) (by C-G formula based on SCr of 1.78 mg/dL (H)).    No Known Allergies  Antimicrobials this admission: 5/29 cefepime >>  5/29 vancomycin >> 5/29 5/31 Azithromycin per ID >>  6/1 TMP/SMZ >>  Dose adjustments this admission:   Microbiology results: 5/29 BCx: ngtd 5/29 UCx: <10k insignificant growth 5/29 MRSA PCR: negative 5/29 SARS2: negative x2  Thank you for allowing pharmacy to be a part of this patient's care.  DuDoreene ElandPharmD, BCPS.   Work Cell: [phone]/04/2018 9:46 AM

## 2018-09-11 NOTE — Progress Notes (Signed)
NAME:  [name] Rose, MRN:  [id number], DOB:  1950-08-04, LOS: 3 ADMISSION DATE:  09/08/2018, CONSULTATION DATE:  [id number] REFERRING MD:  Dr. Sloan Leiter, [facility], CHIEF COMPLAINT:  Short of breath   Brief History   68 yo male former smoker presented to ER with weakness, hypoxia, fever 101.5.  He has hx of metastatic bladder cancer on targeted therapy with Padcev x 3 cycles.  CXR showed pulmonary infiltrates.  COVID testing negative x 2.  He had progressive hypoxia and PCCM asked to assess.  Past Medical History  HTN, HLD, Prostate cancer, PAF, CKD 3, MSSA Bacteremia March 2020, chemotherapy induced neutropenia, moderate mitral regurgitation  Significant Hospital Events   5/29 Admit 5/31 solumedrol started, heparin gtt started, transfuse PRBC 6/01 add bactrim for possible PCP  Consults:  Oncology 5/30 bladder cancer ID 5/31 atypical pneumonia  Procedures:    Significant Diagnostic Tests:  Dopper legs b/l 5/31 >> no DVT CT chest 5/31 >> atherosclerosis, extensive GGO with inter and intralobular thickening, fatty liver   Micro Data:  Blood 5/29 >> COVID 5/29 >> negative Urine 5/29 >> insignificant growth COVID 5/29 >> negative Influenza 5/30 >> negative  Antimicrobials:  Vancomycin 5/28 >> Cefepime 5/28 >>  Bactrim 6/01 >>  Interim history/subjective:  Remains on increased oxygen.  Feels work of breathing is tolerable, but short of breath with any movements, and with oxygen desaturation.  Objective   Blood pressure 114/77, pulse 83, temperature (!) 96.7 F (35.9 C), temperature source Axillary, resp. rate (!) 22, height _0  (1.88 m), weight 113.4 kg, SpO2 95 %.    FiO2 (%):  [70 %-100 %] 100 %   Intake/Output Summary (Last 24 hours) at 09/11/2018 0857 Last data filed at 09/11/2018 0805 Gross per 24 hour  Intake 960.07 ml  Output 950 ml  Net 10.07 ml   Filed Weights   09/08/18 0545  Weight: 113.4 kg    Examination:  General - alert, speaking in full sentences, mild  increased WOB Eyes - pupils reactive ENT - no sinus tenderness, no stridor Cardiac - regular rate/rhythm, no murmur Chest - faint b/l crackles Abdomen - soft, non tender, + bowel sounds Extremities - no cyanosis, clubbing, or edema Skin - no rashes Neuro - normal strength, moves extremities, follows commands  CXR (reviewed by me) - b/l infiltrates   Resolved Hospital Problem list     Assessment & Plan:   Acute hypoxic respiratory failure with GGO pulmonary infiltrates. Discussion: Differential at this point is atypical infection (?PCP) versus pulmonary toxicity related to cancer therapy.  While it would be helpful to get further information from bronchoscopy with BAL, at this point it is too risky given his tenuous respiratory status. Plan - add bactrim 6/01 - continue other ABx per ID - continue solumedrol 80 mg q6h - oxygen to keep SpO2 > 90% - Bipap prn - try to avoid intubation if possible, but still at risk for this  Paroxysmal atrial fibrillation >> CHADSVASC score 3. ?pulmonary embolism. Discussion: Has elevated DNR, but doppler of legs negative.  Too unstable to go for further imaging studies to assess. Plan - continue heparin gtt  Anemia, thrombocytopenia in setting of metastatic bladder cancer on targeted therapy. Plan - f/u CBC - transfuse for Hb < 7 - oncology following  CKD 3. Plan - f/u BMET  Steroid induced hyperglycemia. Plan - SSI   Best practice:  Diet: heart healthy DVT prophylaxis: Heparin gtt GI prophylaxis: Not indicated Mobility: Bed rest Code Status:  full code Disposition: ICU  Labs    CMP Latest Ref Rng & Units 09/11/2018 09/10/2018 09/09/2018  Glucose 70 - 99 mg/dL 196(H) 125(H) 129(H)  BUN 8 - 23 mg/dL 42(H) 36(H) 38(H)  Creatinine 0.61 - 1.24 mg/dL 1.78(H) 1.74(H) 1.95(H)  Sodium 135 - 145 mmol/L 138 136 137  Potassium 3.5 - 5.1 mmol/L 4.9 3.1(L) 3.2(L)  Chloride 98 - 111 mmol/L 107 104 106  CO2 22 - 32 mmol/L _0 Calcium 8.9 - 10.3 mg/dL 8.3(L) 8.1(L) 7.9(L)  Total Protein 6.5 - 8.1 g/dL 5.1(L) 4.8(L) 5.0(L)  Total Bilirubin 0.3 - 1.2 mg/dL 0.7 0.8 0.6  Alkaline Phos 38 - 126 U/L 81 52 47  AST 15 - 41 U/L 57(H) 64(H) 79(H)  ALT 0 - 44 U/L 34 36 39   CBC Latest Ref Rng & Units 09/11/2018 09/10/2018 09/09/2018  WBC 4.0 - 10.5 K/uL 6.0 7.0 10.2  Hemoglobin 13.0 - 17.0 g/dL 8.2(L) 7.0(L) 7.1(L)  Hematocrit 39.0 - 52.0 % 26.1(L) 22.0(L) 22.1(L)  Platelets 150 - 400 K/uL 93(L) 91(L) 94(L)   ABG    Component Value Date/Time   PHART 7.431 09/10/2018 1432   PCO2ART 34.5 09/10/2018 1432   PO2ART 51.7 (L) 09/10/2018 1432   HCO3 22.5 09/10/2018 1432   TCO2 29 09/20/2016 0657   ACIDBASEDEF 0.9 09/10/2018 1432   O2SAT 85.7 09/10/2018 1432   CBG (last 3)  No results for input(s): GLUCAP in the last 72 hours.  CC time 32 minutes  [name], MD [facility] Pulmonary/Critical Care 09/11/2018, 9:07 AM

## 2018-09-12 ENCOUNTER — Inpatient Hospital Stay (HOSPITAL_COMMUNITY): Payer: PPO

## 2018-09-12 DIAGNOSIS — Z9911 Dependence on respirator [ventilator] status: Secondary | ICD-10-CM

## 2018-09-12 DIAGNOSIS — J181 Lobar pneumonia, unspecified organism: Secondary | ICD-10-CM

## 2018-09-12 LAB — BLOOD GAS, ARTERIAL
Acid-base deficit: 9.9 mmol/L — ABNORMAL HIGH (ref 0.0–2.0)
Acid-base deficit: 8.7 mmol/L — ABNORMAL HIGH (ref 0.0–2.0)
Bicarbonate: 20.9 mmol/L (ref 20.0–28.0)
Bicarbonate: 18.7 mmol/L — ABNORMAL LOW (ref 20.0–28.0)
Drawn by: 257701
Drawn by: 257701
FIO2: 100
FIO2: 50
MECHVT: 490 mL
MECHVT: 570 mL
O2 Saturation: 99.2 %
O2 Saturation: 91.4 %
PEEP: 10 cmH2O
PEEP: 10 cmH2O
Patient temperature: 98.6
Patient temperature: 98.6
RATE: 24 resp/min
RATE: 30 resp/min
pCO2 arterial: 77.1 mmHg (ref 32.0–48.0)
pCO2 arterial: 49.8 mmHg — ABNORMAL HIGH (ref 32.0–48.0)
pH, Arterial: 7.062 — CL (ref 7.350–7.450)
pH, Arterial: 7.2 — ABNORMAL LOW (ref 7.350–7.450)
pO2, Arterial: 383 mmHg — ABNORMAL HIGH (ref 83.0–108.0)
pO2, Arterial: 77.8 mmHg — ABNORMAL LOW (ref 83.0–108.0)

## 2018-09-12 LAB — CBC WITH DIFFERENTIAL/PLATELET
Abs Immature Granulocytes: 0.13 10*3/uL — ABNORMAL HIGH (ref 0.00–0.07)
Basophils Absolute: 0 10*3/uL (ref 0.0–0.1)
Basophils Relative: 0 %
Eosinophils Absolute: 0 10*3/uL (ref 0.0–0.5)
Eosinophils Relative: 0 %
HCT: 25.6 % — ABNORMAL LOW (ref 39.0–52.0)
Hemoglobin: 8.2 g/dL — ABNORMAL LOW (ref 13.0–17.0)
Immature Granulocytes: 1 %
Lymphocytes Relative: 7 %
Lymphs Abs: 0.9 10*3/uL (ref 0.7–4.0)
MCH: 30.7 pg (ref 26.0–34.0)
MCHC: 32 g/dL (ref 30.0–36.0)
MCV: 95.9 fL (ref 80.0–100.0)
Monocytes Absolute: 0.4 10*3/uL (ref 0.1–1.0)
Monocytes Relative: 3 %
Neutro Abs: 10.9 10*3/uL — ABNORMAL HIGH (ref 1.7–7.7)
Neutrophils Relative %: 89 %
Platelets: 117 10*3/uL — ABNORMAL LOW (ref 150–400)
RBC: 2.67 MIL/uL — ABNORMAL LOW (ref 4.22–5.81)
RDW: 16.9 % — ABNORMAL HIGH (ref 11.5–15.5)
WBC: 12.3 10*3/uL — ABNORMAL HIGH (ref 4.0–10.5)
nRBC: 0 % (ref 0.0–0.2)

## 2018-09-12 LAB — MAGNESIUM
Magnesium: 2.1 mg/dL (ref 1.7–2.4)
Magnesium: 1.9 mg/dL (ref 1.7–2.4)

## 2018-09-12 LAB — COMPREHENSIVE METABOLIC PANEL
ALT: 34 U/L (ref 0–44)
AST: 50 U/L — ABNORMAL HIGH (ref 15–41)
Albumin: 2.1 g/dL — ABNORMAL LOW (ref 3.5–5.0)
Alkaline Phosphatase: 97 U/L (ref 38–126)
Anion gap: 10 (ref 5–15)
BUN: 52 mg/dL — ABNORMAL HIGH (ref 8–23)
CO2: 20 mmol/L — ABNORMAL LOW (ref 22–32)
Calcium: 8.3 mg/dL — ABNORMAL LOW (ref 8.9–10.3)
Chloride: 108 mmol/L (ref 98–111)
Creatinine, Ser: 1.83 mg/dL — ABNORMAL HIGH (ref 0.61–1.24)
GFR calc Af Amer: 43 mL/min — ABNORMAL LOW (ref >60)
GFR calc non Af Amer: 37 mL/min — ABNORMAL LOW (ref >60)
Glucose, Bld: 166 mg/dL — ABNORMAL HIGH (ref 70–99)
Potassium: 5.3 mmol/L — ABNORMAL HIGH (ref 3.5–5.1)
Sodium: 138 mmol/L (ref 135–145)
Total Bilirubin: 0.2 mg/dL — ABNORMAL LOW (ref 0.3–1.2)
Total Protein: 5.1 g/dL — ABNORMAL LOW (ref 6.5–8.1)

## 2018-09-12 LAB — BODY FLUID CELL COUNT WITH DIFFERENTIAL
Eos, Fluid: 0 %
Lymphs, Fluid: 18 %
Monocyte-Macrophage-Serous Fluid: 6 % — ABNORMAL LOW (ref 50–90)
Neutrophil Count, Fluid: 76 % — ABNORMAL HIGH (ref 0–25)
Total Nucleated Cell Count, Fluid: 220 cu mm (ref 0–1000)

## 2018-09-12 LAB — GLUCOSE, CAPILLARY
Glucose-Capillary: 146 mg/dL — ABNORMAL HIGH (ref 70–99)
Glucose-Capillary: 206 mg/dL — ABNORMAL HIGH (ref 70–99)
Glucose-Capillary: 211 mg/dL — ABNORMAL HIGH (ref 70–99)
Glucose-Capillary: 246 mg/dL — ABNORMAL HIGH (ref 70–99)
Glucose-Capillary: 223 mg/dL — ABNORMAL HIGH (ref 70–99)
Glucose-Capillary: 130 mg/dL — ABNORMAL HIGH (ref 70–99)
Glucose-Capillary: 221 mg/dL — ABNORMAL HIGH (ref 70–99)

## 2018-09-12 LAB — PHOSPHORUS
Phosphorus: 6 mg/dL — ABNORMAL HIGH (ref 2.5–4.6)
Phosphorus: 4.2 mg/dL (ref 2.5–4.6)

## 2018-09-12 LAB — HEPARIN LEVEL (UNFRACTIONATED)
Heparin Unfractionated: 0.33 IU/mL (ref 0.30–0.70)
Heparin Unfractionated: 0.71 IU/mL — ABNORMAL HIGH (ref 0.30–0.70)
Heparin Unfractionated: 1 IU/mL — ABNORMAL HIGH (ref 0.30–0.70)

## 2018-09-12 MED ORDER — ACETAMINOPHEN 650 MG RE SUPP: 650.0000 mg | Freq: Four times a day (QID) | RECTAL | Status: DC | PRN

## 2018-09-12 MED ORDER — LORAZEPAM 2 MG/ML IJ SOLN
1.0000 mg | Freq: Once | INTRAMUSCULAR | Status: AC
  Administered 2018-09-12: 1 mg via INTRAVENOUS
  Filled 2018-09-12: qty 1

## 2018-09-12 MED ORDER — MIDAZOLAM HCL 2 MG/2ML IJ SOLN
1.0000 mg | INTRAMUSCULAR | Status: DC | PRN
  Administered 2018-09-12 – 2018-09-20 (×29): 1 mg via INTRAVENOUS
  Filled 2018-09-12 (×31): qty 2

## 2018-09-12 MED ORDER — FENTANYL BOLUS VIA INFUSION
25.0000 ug | INTRAVENOUS | Status: DC | PRN
  Administered 2018-09-12 – 2018-09-17 (×27): 25 ug via INTRAVENOUS
  Filled 2018-09-12: qty 25

## 2018-09-12 MED ORDER — VITAL HIGH PROTEIN PO LIQD
1000.0000 mL | ORAL | Status: DC
  Administered 2018-09-12: 1000 mL

## 2018-09-12 MED ORDER — DILTIAZEM 12 MG/ML ORAL SUSPENSION
30.0000 mg | Freq: Three times a day (TID) | ORAL | Status: DC
  Administered 2018-09-12 – 2018-09-20 (×19): 30 mg
  Filled 2018-09-12 (×29): qty 3

## 2018-09-12 MED ORDER — MIDAZOLAM HCL 2 MG/2ML IJ SOLN
INTRAMUSCULAR | Status: AC
  Administered 2018-09-12: 2 mg via INTRAVENOUS
  Filled 2018-09-12: qty 2

## 2018-09-12 MED ORDER — ORAL CARE MOUTH RINSE
15.0000 mL | OROMUCOSAL | Status: DC
  Administered 2018-09-12 – 2018-09-18 (×54): 15 mL via OROMUCOSAL

## 2018-09-12 MED ORDER — PANTOPRAZOLE SODIUM 40 MG PO PACK
40.0000 mg | PACK | ORAL | Status: DC
  Administered 2018-09-12 – 2018-09-26 (×14): 40 mg
  Filled 2018-09-12 (×14): qty 20

## 2018-09-12 MED ORDER — PRO-STAT SUGAR FREE PO LIQD
30.0000 mL | Freq: Two times a day (BID) | ORAL | Status: DC
  Administered 2018-09-12 – 2018-09-14 (×5): 30 mL
  Filled 2018-09-12 (×5): qty 30

## 2018-09-12 MED ORDER — METOPROLOL TARTRATE 25 MG PO TABS
25.0000 mg | ORAL_TABLET | Freq: Two times a day (BID) | ORAL | Status: DC
  Administered 2018-09-12: 25 mg
  Filled 2018-09-12: qty 1

## 2018-09-12 MED ORDER — PHENYLEPHRINE HCL-NACL 10-0.9 MG/250ML-% IV SOLN
0.0000 ug/min | INTRAVENOUS | Status: DC
  Administered 2018-09-12: 100 ug/min via INTRAVENOUS
  Administered 2018-09-12: 80 ug/min via INTRAVENOUS
  Administered 2018-09-12: 16:00:00 20 ug/min via INTRAVENOUS
  Filled 2018-09-12 (×6): qty 250

## 2018-09-12 MED ORDER — FENTANYL CITRATE (PF) 100 MCG/2ML IJ SOLN
INTRAMUSCULAR | Status: AC
  Administered 2018-09-12: 100 ug via INTRAVENOUS
  Filled 2018-09-12: qty 2

## 2018-09-12 MED ORDER — DILTIAZEM 12 MG/ML ORAL SUSPENSION
60.0000 mg | Freq: Four times a day (QID) | ORAL | Status: DC
  Administered 2018-09-12: 60 mg
  Filled 2018-09-12 (×2): qty 6

## 2018-09-12 MED ORDER — GABAPENTIN 300 MG PO CAPS
300.0000 mg | ORAL_CAPSULE | Freq: Three times a day (TID) | ORAL | Status: DC
  Administered 2018-09-12 – 2018-09-18 (×19): 300 mg
  Filled 2018-09-12 (×19): qty 1

## 2018-09-12 MED ORDER — SENNOSIDES 8.8 MG/5ML PO SYRP
5.0000 mL | ORAL_SOLUTION | Freq: Two times a day (BID) | ORAL | Status: DC | PRN
  Filled 2018-09-12: qty 5

## 2018-09-12 MED ORDER — ETOMIDATE 2 MG/ML IV SOLN
20.0000 mg | Freq: Once | INTRAVENOUS | Status: AC
  Administered 2018-09-12: 20 mg via INTRAVENOUS

## 2018-09-12 MED ORDER — METOPROLOL TARTRATE 12.5 MG HALF TABLET
12.5000 mg | ORAL_TABLET | Freq: Two times a day (BID) | ORAL | Status: DC
  Administered 2018-09-13 – 2018-09-18 (×7): 12.5 mg
  Filled 2018-09-12 (×11): qty 1

## 2018-09-12 MED ORDER — MIDAZOLAM HCL 2 MG/2ML IJ SOLN
2.0000 mg | Freq: Once | INTRAMUSCULAR | Status: AC
  Administered 2018-09-12: 2 mg via INTRAVENOUS

## 2018-09-12 MED ORDER — SODIUM CHLORIDE 0.9 % IV SOLN
25.0000 ug/h | INTRAVENOUS | Status: DC
  Administered 2018-09-12: 25 ug/h via INTRAVENOUS
  Administered 2018-09-12: 150 ug/h via INTRAVENOUS
  Filled 2018-09-12 (×2): qty 50

## 2018-09-12 MED ORDER — FENTANYL CITRATE (PF) 100 MCG/2ML IJ SOLN
100.0000 ug | Freq: Once | INTRAMUSCULAR | Status: AC
  Administered 2018-09-12: 100 ug via INTRAVENOUS

## 2018-09-12 MED ORDER — SODIUM CHLORIDE 0.9 % IV BOLUS
1000.0000 mL | Freq: Once | INTRAVENOUS | Status: AC
  Administered 2018-09-12: 14:00:00 1000 mL via INTRAVENOUS

## 2018-09-12 MED ORDER — METOPROLOL TARTRATE 5 MG/5ML IV SOLN
5.0000 mg | INTRAVENOUS | Status: DC | PRN
  Administered 2018-09-12 – 2018-10-10 (×17): 5 mg via INTRAVENOUS
  Filled 2018-09-12 (×17): qty 5

## 2018-09-12 MED ORDER — INSULIN ASPART 100 UNIT/ML ~~LOC~~ SOLN
3.0000 [IU] | SUBCUTANEOUS | Status: DC
  Administered 2018-09-12 (×3): 9 [IU] via SUBCUTANEOUS
  Administered 2018-09-12: 21:00:00 3 [IU] via SUBCUTANEOUS
  Administered 2018-09-13: 6 [IU] via SUBCUTANEOUS
  Administered 2018-09-13: 3 [IU] via SUBCUTANEOUS
  Administered 2018-09-13 (×2): 6 [IU] via SUBCUTANEOUS
  Administered 2018-09-13: 9 [IU] via SUBCUTANEOUS
  Administered 2018-09-13 (×2): 6 [IU] via SUBCUTANEOUS
  Administered 2018-09-14 (×2): 9 [IU] via SUBCUTANEOUS
  Administered 2018-09-14: 6 [IU] via SUBCUTANEOUS
  Administered 2018-09-14 (×2): 9 [IU] via SUBCUTANEOUS
  Administered 2018-09-15 (×6): 6 [IU] via SUBCUTANEOUS
  Administered 2018-09-16: 9 [IU] via SUBCUTANEOUS

## 2018-09-12 MED ORDER — ROCURONIUM BROMIDE 50 MG/5ML IV SOLN
80.0000 mg | Freq: Once | INTRAVENOUS | Status: AC
  Administered 2018-09-12: 80 mg via INTRAVENOUS
  Filled 2018-09-12: qty 8

## 2018-09-12 MED ORDER — BISACODYL 10 MG RE SUPP: 10.0000 mg | Freq: Every day | RECTAL | Status: DC | PRN

## 2018-09-12 MED ORDER — CHLORHEXIDINE GLUCONATE 0.12% ORAL RINSE (MEDLINE KIT)
15.0000 mL | Freq: Two times a day (BID) | OROMUCOSAL | Status: DC
  Administered 2018-09-12 – 2018-09-17 (×12): 15 mL via OROMUCOSAL

## 2018-09-12 MED ORDER — ACETAMINOPHEN 325 MG PO TABS
650.0000 mg | ORAL_TABLET | Freq: Four times a day (QID) | ORAL | Status: DC | PRN
  Administered 2018-10-06 – 2018-10-13 (×6): 650 mg
  Filled 2018-09-12 (×6): qty 2

## 2018-09-12 MED ORDER — VITAMIN B-6 100 MG PO TABS
300.0000 mg | ORAL_TABLET | Freq: Every day | ORAL | Status: DC
  Administered 2018-09-13 – 2018-09-25 (×12): 300 mg
  Filled 2018-09-12 (×14): qty 3

## 2018-09-12 MED ORDER — VITAL HIGH PROTEIN PO LIQD
1000.0000 mL | ORAL | Status: DC
  Administered 2018-09-12 – 2018-09-13 (×2): 1000 mL

## 2018-09-12 NOTE — Progress Notes (Signed)
PROGRESS NOTE    [name] Rose  MNO:[id number] DOB: 1951/02/14 DOA: 09/08/2018 PCP: Shirline Frees, MD    Brief Narrative:  68 year old gentleman with history of hypertension, hyperlipidemia, prediabetes, prostate cancer, metastasized bladder cancer on chemotherapy, paroxysmal A. fib, CKD stage III, recent MSSA bacteremia who was admitted to the hospital secondary to significant weakness and shortness of breath after receiving chemotherapy about 1 week ago.  In the emergency room, patient was afebrile, tachypneic, blood pressures 80/50 improved with oxygenation.  Chest x-ray significant for diffuse interstitial groundglass opacities concerning for atypical pneumonia.  Treated with IV fluid boluses and IV antibiotics and admitted to the stepdown unit. Patient remained significantly hypoxic all throughout.  09/10/2018: CT chest shows atypical pneumonia patterns and ARDS.  Continues to need high flow oxygen.  Empirically treated with heparin because of significant elevated d-dimer.  Infectious disease added Bactrim. PCCM consulted. 09/12/2018: Remains on 100% FiO2 with fatigue and more shortness of breath on BiPAP and intubated. Transfer under ICU care.   Assessment & Plan:   Principal Problem:   Pneumonia Active Problems:   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 (HCC)   LFT elevation   AKI (acute kidney injury) ([place])   CKD (chronic kidney disease), stage III (HCC)   Acute respiratory failure with hypoxia (HCC)  Acute hypoxic respiratory failure: No previous oxygen use.  Suspect due to atypical pneumonia in a patient with receiving chemotherapy. Aggressive chest physiotherapy and bronchodilator therapy. Was On high flow oxygen and nonrebreather.  Still requiring significant oxygen.  Followed by PCCM. Patient with worsening shortness of breath and fatigue, electively intubated 09/12/2018.  Presumed pulmonary embolism: Significant new onset hypoxia D-dimer, 1.73-19 and  24 hours Lower extremity duplexes negative Cannot have CT scan with contrast because of CKD Cannot have VQ scan on high flow oxygen and COVID-19 logistic limitations. High pretest probability of thromboembolism, treating as PE.  Follow-up CT with contrast once renal function improved.  Bacterial pneumonia: Was treated with vancomycin and cefepime, MRSA negative, vancomycin discontinued.  Continue on cefepime until clinical improvement.  Chest physiotherapy, incentive spirometry, deep breathing exercises, sputum induction, mucolytic's and bronchodilators. All cultures are negative. Very atypical looking pneumonia possible on immunocompromised host.  Added Bactrim.  COVID-19: Ruled out with 2- results.  Acute kidney injury on chronic kidney disease stage III: Treated with IV fluids with improvement of creatinine.  At about baseline level of 1.7-1.8.  Slight worsening today. Potassium was elevated, supplements discontinued.  Acute on chronic anemia of chronic disease: Hemoglobin was 7. Transfused 1 unit of PRBC with improvement of hemoglobin to 8.2.  Paroxysmal A. fib: Remains tachycardic.  Resumed metoprolol and diltiazem.  PRN metoprolol.  Better today.  Metastatic bladder cancer with prostate cancer currently on chemotherapy: Followed by oncology.  History of hypertension: Blood pressure medications on hold.  Resuming Cardizem and metoprolol for tachycardia.  Hypokalemia: Replaced aggressively.  Now off.  Discontinue potassium.   DVT prophylaxis: Heparin IV Code Status: Full code Family Communication: Patient's wife updated by PCCM. Disposition Plan: ICU.   Consultants:   Oncology  PCCM  Procedures:   None  Antimicrobials:   Vancomycin, 09/08/2018--09/09/2018  Cefepime, 09/08/2018----  Bactrim, 09/10/2018   Subjective: Patient seen and examined.  Remained on 100% oxygen.  He remains on BiPAP overnight.  Early morning discussed with PCCM.  Later on in the morning, he  continues to be weak and fatigued on 100% FiO2 and was intubated and mechanically ventilated. Afebrile overnight.  Objective: Vitals:  09/12/18 0614 09/12/18 0800 09/12/18 0856 09/12/18 0935  BP: 130/79  (!) 189/91 (!) 176/86  Pulse: 88  (!) 117 (!) 145  Resp: (!) 37  (!) 24   Temp:  (!) 97.4 F (36.3 C)    TempSrc:  Axillary    SpO2: 100%  92%   Weight:      Height:   '6\' 2"'  (1.88 m)     Intake/Output Summary (Last 24 hours) at 09/12/2018 1032 Last data filed at 09/12/2018 0549 Gross per 24 hour  Intake 2514.68 ml  Output 925 ml  Net 1589.68 ml   Filed Weights   09/08/18 0545  Weight: 113.4 kg    Examination:  General exam: Appears anxious with moderate distress on high flow oxygen.  Acutely sick looking. Respiratory system: Bilateral very poor air entry with no added sounds.  Port on right chest clean and dry. Cardiovascular system: S1 & S2 heard, irregularly irregular and tachycardic.  No pedal edema. Gastrointestinal system: Abdomen is distended but soft.  nontender. No organomegaly or masses felt. Normal bowel sounds heard. Central nervous system: Alert and oriented. No focal neurological deficits. Extremities: Symmetric 5 x 5 power. Skin: No rashes, lesions or ulcers Psychiatry: Judgement and insight appear normal. Mood & affect anxious. Patient mechanically ventilated after the rounds.   Data Reviewed: I have personally reviewed following labs and imaging studies  CBC: Recent Labs  Lab 09/08/18 0255 09/09/18 0410 09/10/18 0240 09/11/18 0301 09/12/18 0439  WBC 8.8 10.2 7.0 6.0 12.3*  NEUTROABS 7.8* 9.5* 6.0 5.6 10.9*  HGB 7.8* 7.1* 7.0* 8.2* 8.2*  HCT 23.0* 22.1* 22.0* 26.1* 25.6*  MCV 93.5 95.3 96.1 97.4 95.9  PLT 90* 94* 91* 93* 638*   Basic Metabolic Panel: Recent Labs  Lab 09/08/18 0255 09/08/18 0312 09/08/18 0452 09/09/18 0410 09/10/18 0240 09/11/18 0301 09/12/18 0439  NA 132*  --   --  137 136 138 138  K 3.3*  --   --  3.2* 3.1* 4.9 5.3*    CL 99  --   --  106 104 107 108  CO2 22  --   --  '22 22 22 ' 20*  GLUCOSE 194*  --   --  129* 125* 196* 166*  BUN 40*  --   --  38* 36* 42* 52*  CREATININE 2.26*  --  2.30* 1.95* 1.74* 1.78* 1.83*  CALCIUM 8.0*  --   --  7.9* 8.1* 8.3* 8.3*  MG  --  1.7  --   --   --   --   --    GFR: Estimated Creatinine Clearance: 52.5 mL/min (A) (by C-G formula based on SCr of 1.83 mg/dL (H)). Liver Function Tests: Recent Labs  Lab 09/08/18 0255 09/09/18 0410 09/10/18 0240 09/11/18 0301 09/12/18 0439  AST 66* 79* 64* 57* 50*  ALT 47* 39 36 34 34  ALKPHOS 45 47 52 81 97  BILITOT 0.8 0.6 0.8 0.7 0.2*  PROT 5.2* 5.0* 4.8* 5.1* 5.1*  ALBUMIN 2.5* 2.3* 2.1* 2.1* 2.1*   No results for input(s): LIPASE, AMYLASE in the last 168 hours. No results for input(s): AMMONIA in the last 168 hours. Coagulation Profile: No results for input(s): INR, PROTIME in the last 168 hours. Cardiac Enzymes: Recent Labs  Lab 09/08/18 0255 09/08/18 0312  CKTOTAL 9*  --   TROPONINI  --  0.03*   BNP (last 3 results) No results for input(s): PROBNP in the last 8760 hours. HbA1C: No results for input(s): HGBA1C  in the last 72 hours. CBG: Recent Labs  Lab 09/11/18 1147 09/11/18 1603 09/11/18 2144 09/12/18 0940  GLUCAP 242* 207* 146* 211*   Lipid Profile: No results for input(s): CHOL, HDL, LDLCALC, TRIG, CHOLHDL, LDLDIRECT in the last 72 hours. Thyroid Function Tests: No results for input(s): TSH, T4TOTAL, FREET4, T3FREE, THYROIDAB in the last 72 hours. Anemia Panel: No results for input(s): VITAMINB12, FOLATE, FERRITIN, TIBC, IRON, RETICCTPCT in the last 72 hours. Sepsis Labs: Recent Labs  Lab 09/08/18 0358 09/08/18 1047  PROCALCITON  --  1.72  LATICACIDVEN 1.1  --     Recent Results (from the past 240 hour(s))  Blood Culture (routine x 2)     Status: None (Preliminary result)   Collection Time: 09/08/18  3:58 AM  Result Value Ref Range Status   Specimen Description   Final    BLOOD RIGHT PORTA  CATH Performed at [facility] [address] [place], [place] [address]    Special Requests   Final    BOTTLES DRAWN AEROBIC AND ANAEROBIC Blood Culture adequate volume Performed at [facility] [address], [place], [place] [address]    Culture   Final    NO GROWTH 4 DAYS Performed at [facility], [place] [address], [place], [place] [address]    Report Status PENDING  Incomplete  SARS Coronavirus 2 (CEPHEID - Performed in [facility] hospital lab), Hosp Order     Status: None   Collection Time: 09/08/18  3:58 AM  Result Value Ref Range Status   SARS Coronavirus 2 NEGATIVE NEGATIVE Final    Comment: (NOTE) If result is NEGATIVE SARS-CoV-2 target nucleic acids are NOT DETECTED. The SARS-CoV-2 RNA is generally detectable in upper and lower  respiratory specimens during the acute phase of infection. The lowest  concentration of SARS-CoV-2 viral copies this assay can detect is 250  copies / mL. A negative result does not preclude SARS-CoV-2 infection  and should not be used as the sole basis for treatment or other  patient management decisions.  A negative result may occur with  improper specimen collection / handling, submission of specimen other  than nasopharyngeal swab, presence of viral mutation(s) within the  areas targeted by this assay, and inadequate number of viral copies  (<250 copies / mL). A negative result must be combined with clinical  observations, patient history, and epidemiological information. If result is POSITIVE SARS-CoV-2 target nucleic acids are DETECTED. The SARS-CoV-2 RNA is generally detectable in upper and lower  respiratory specimens dur ing the acute phase of infection.  Positive  results are indicative of active infection with SARS-CoV-2.  Clinical  correlation with patient history and other diagnostic information is  necessary to determine patient infection status.  Positive results do  not rule out  bacterial infection or co-infection with other viruses. If result is PRESUMPTIVE POSTIVE SARS-CoV-2 nucleic acids MAY BE PRESENT.   A presumptive positive result was obtained on the submitted specimen  and confirmed on repeat testing.  While 2019 novel coronavirus  (SARS-CoV-2) nucleic acids may be present in the submitted sample  additional confirmatory testing may be necessary for epidemiological  and / or clinical management purposes  to differentiate between  SARS-CoV-2 and other Sarbecovirus currently known to infect humans.  If clinically indicated additional testing with an alternate test  methodology [phone]) is advised. The SARS-CoV-2 RNA is generally  detectable in upper and lower respiratory sp ecimens during the acute  phase of infection.  The expected result is Negative. Fact Sheet for Patients:  StrictlyIdeas.no Fact Sheet for Healthcare Providers: BankingDealers.co.za This test is not yet approved or cleared by the [place] FDA and has been authorized for detection and/or diagnosis of SARS-CoV-2 by FDA under an Emergency Use Authorization (EUA).  This EUA will remain in effect (meaning this test can be used) for the duration of the COVID-19 declaration under Section 564(b)(1) of the Act, 21 U.S.C. section 360bbb-3(b)(1), unless the authorization is terminated or revoked sooner. Performed at [facility], [place] [address], [place], [place] [address]   Blood Culture (routine x 2)     Status: None (Preliminary result)   Collection Time: 09/08/18  4:43 AM  Result Value Ref Range Status   Specimen Description   Final    BLOOD LEFT ANTECUBITAL Performed at [facility] [address], [place], [place] [address]    Special Requests   Final    BOTTLES DRAWN AEROBIC AND ANAEROBIC Blood Culture adequate volume Performed at [facility] [address], [place],  [place] [address]    Culture   Final    NO GROWTH 4 DAYS Performed at [facility], [place] [address] [place], [place] [address]    Report Status PENDING  Incomplete  Urine culture     Status: Abnormal   Collection Time: 09/08/18  8:25 AM  Result Value Ref Range Status   Specimen Description   Final    URINE, RANDOM Performed at [facility] [address] [place], [place] [address]    Special Requests   Final    NONE Performed at [facility], [place] [address], [place], [place] [address]    Culture (A)  Final    <10,000 COLONIES/mL INSIGNIFICANT GROWTH Performed at [facility] [address], [place], [place] [address]    Report Status 09/09/2018 FINAL  Final  MRSA PCR Screening     Status: None   Collection Time: 09/08/18  8:52 AM  Result Value Ref Range Status   MRSA by PCR NEGATIVE NEGATIVE Final    Comment:        The GeneXpert MRSA Assay (FDA approved for NASAL specimens only), is one component of a comprehensive MRSA colonization surveillance program. It is not intended to diagnose MRSA infection nor to guide or monitor treatment for MRSA infections. Performed at [facility], [place] [address], [place],  [address]   Novel Coronavirus,NAA,(SEND-OUT TO REF LAB - TAT 24-48 hrs); Hosp Order     Status: None   Collection Time: 09/08/18 10:47 AM  Result Value Ref Range Status   SARS-CoV-2, NAA NOT DETECTED NOT DETECTED Final    Comment: (NOTE) This test was developed and its performance characteristics determined by [employer]. This test has not been FDA cleared or approved. This test has been authorized by FDA under an Emergency Use Authorization (EUA). This test is only authorized for the duration of time the declaration that circumstances exist justifying the authorization of the emergency use of in vitro diagnostic tests for detection of SARS-CoV-2 virus and/or diagnosis of COVID-19  infection under section 564(b)(1) of the Act, 21 U.S.C. 450TUU-8(K)(8), unless the authorization is terminated or revoked sooner. When diagnostic testing is negative, the possibility of a false negative result should be considered in the context of a patient's recent exposures and the presence of clinical signs and symptoms consistent with COVID-19. An individual without symptoms of COVID-19  and who is not shedding SARS-CoV-2 virus would expect to have a negative (not detected) result in this assay. Performed  At: [facility] [place], [place] [id number] Rush Farmer MD ER:[id number]    [place]  Final    Comment: Performed at [facility] [address], [place], [place] [address]         Radiology Studies: Ct Chest Wo Contrast  Result Date: 09/10/2018 CLINICAL DATA:  Severe shortness of breath. EXAM: CT CHEST WITHOUT CONTRAST TECHNIQUE: Multidetector CT imaging of the chest was performed following the standard protocol without IV contrast. COMPARISON:  Single-view of the chest 09/08/2018. PA and lateral chest 05/14/2017. PET CT scan 09/05/2018. FINDINGS: Cardiovascular: There is mild cardiomegaly. Calcific aortic and coronary atherosclerosis noted. No pericardial effusion. Mediastinum/Nodes: No enlarged mediastinal or axillary lymph nodes. Thyroid gland, trachea, and esophagus demonstrate no significant findings. Lungs/Pleura: Extensive ground-glass attenuation is present throughout the lungs with inter and intralobular septal thickening. Small areas of sparing in the superior segment of the right lower lobe and posterior left lower lobe noted. No nodule or mass. No pleural effusion. Upper Abdomen: There is fatty infiltration of the visualized liver. Musculoskeletal: No acute or focal abnormality. IMPRESSION: Diffusely abnormal appearance of the lungs is nonspecific and can be seen in bacterial pneumonia, acute  interstitial pneumonia, ARDS among other causes. Cardiomegaly. Fatty infiltration of the liver. Aortic Atherosclerosis (ICD10-I70.0). Electronically Signed   By: Inge Rise M.D.   On: 09/10/2018 12:56   Dg Chest Port 1 View  Result Date: 09/12/2018 CLINICAL DATA:  Hypertension, hyperlipidemia with metastatic bladder cancer. Status post intubation. EXAM: PORTABLE CHEST 1 VIEW COMPARISON:  09/11/2018 FINDINGS: There is been interval placement of ET tube with tip 4.4 cm above the carina. Right chest wall port a catheter tip is at the cavoatrial junction. NG tube tip is well below the GE junction. Stable cardiac enlargement. Bilateral hazy lung opacities throughout both lungs are unchanged from previous exam. IMPRESSION: 1. Satisfactory position of ET tube with tip above carina. 2. No change in aeration a lungs. Electronically Signed   By: Kerby Moors M.D.   On: 09/12/2018 09:45   Dg Chest Port 1 View  Result Date: 09/11/2018 CLINICAL DATA:  Respiratory failure EXAM: PORTABLE CHEST 1 VIEW COMPARISON:  09/08/18 FINDINGS: Cardiac shadow is mildly prominent. Right-sided chest wall port is seen in satisfactory position. Diffuse opacities are noted throughout both lungs similar to that seen on prior CT examination. No new focal abnormality is noted. IMPRESSION: Stable bilateral infiltrates similar to that seen on recent CT. Electronically Signed   By: Inez Catalina M.D.   On: 09/11/2018 07:18   Dg Abd Portable 1v  Result Date: 09/12/2018 CLINICAL DATA:  Orogastric tube placement. Recent bacteremia. Metastatic bladder cancer. EXAM: PORTABLE ABDOMEN - 1 VIEW COMPARISON:  Radiographs 12/21/2017.  PET-CT 04/25/2018. FINDINGS: 0925 hours. Tip of the enteric tube projects over the mid stomach. There is gas throughout the visualized stomach, small and large bowel without significant distention or wall thickening. No suspicious abdominal calcifications. There are degenerative changes throughout the thoracolumbar  spine. IMPRESSION: Enteric tube projects over the mid stomach. Electronically Signed   By: Richardean Sale M.D.   On: 09/12/2018 09:49        Scheduled Meds:  chlorhexidine gluconate (MEDLINE KIT)  15 mL Mouth Rinse BID   Chlorhexidine Gluconate Cloth  6 each Topical Daily   diltiazem  60 mg Per Tube Q6H   feeding supplement (  PRO-STAT SUGAR FREE 64)  30 mL Per Tube BID   feeding supplement (VITAL HIGH PROTEIN)  1,000 mL Per Tube Q24H   gabapentin  300 mg Per Tube Q8H   insulin aspart  3-9 Units Subcutaneous Q4H   mouth rinse  15 mL Mouth Rinse 10 times per day   methylPREDNISolone (SOLU-MEDROL) injection  80 mg Intravenous Q6H   metoprolol tartrate  25 mg Per Tube BID   pantoprazole sodium  40 mg Per Tube Q24H   vitamin B-6  300 mg Per Tube Daily   Continuous Infusions:  azithromycin Stopped (09/11/18 1859)   ceFEPime (MAXIPIME) IV Stopped (09/12/18 0549)   fentaNYL infusion INTRAVENOUS 25 mcg/hr (09/12/18 0907)   heparin 1,700 Units/hr (09/12/18 0535)   sulfamethoxazole-trimethoprim Stopped (09/12/18 0818)     LOS: 4 days    Time spent: 25 minutes    Barb Merino, MD [facility] Pager [phone]  If 7PM-7AM, please contact night-coverage [email or web address] Password [facility] 09/12/2018, 10:32 AM

## 2018-09-12 NOTE — Procedures (Signed)
Bronchoscopy Procedure Note [name] Rose [id number] 1950-07-22  Procedure: Bronchoscopy Indications: 68 yr old male with metastatic bladder cancer on target therapy admitted with hypoxic respiratory failure, fever, and pulmonary infiltrates.  Procedure for airway inspection and BAL.  Procedure Details Consent: Risks of procedure as well as the alternatives and risks of each were explained to the (patient/caregiver).  Consent for procedure obtained. Time Out: Verified patient identification, verified procedure, site/side was marked, verified correct patient position, special equipment/implants available, medications/allergies/relevent history reviewed, required imaging and test results available.  Performed  In preparation for procedure, patient was given 100% FiO2.  Performed in ICU.  Bronchoscope entered through ETT and advanced to carina which was sharp.  Left main bronchus entered.  Lt upper, lingular and lower lobes visualized.  No endobronchial lesions, mucosal normal.  Right main bronchus entered.  Rt upper, middle, and lower lobes visualized.  No endobronchial lesions and mucosa normal.  Bronchoscope wedged into anterior segment of Rt upper lobe.  Instilled 60 ml of saline with about 25 ml of white to yellow, cloudy fluid returned for BAL.  Bronchoscope then withdrawn.  No immediate complications.  Hemodynamics and oxygenation stable through out the procedure.  Evaluation Will send BAL from Rt upper lobe for cell count with differential, cytology, quantitative culture, AFB with culture, fungal smear with culture, PCP DFA, CMV testing.  [name], MD [facility] Pulmonary/Critical Care 09/12/2018, 2:36 PM

## 2018-09-12 NOTE — Progress Notes (Signed)
I spoke with pt's wife and daughter over the phone and updated about the events of this morning and treatment plan.  [name], MD [facility] Pulmonary/Critical Care 09/12/2018, 9:21 AM

## 2018-09-12 NOTE — Progress Notes (Signed)
ANTICOAGULATION CONSULT NOTE - Follow Up Consult  Pharmacy Consult for Heparin Indication: presumed PE  No Known Allergies  Patient Measurements: Height: 6\' 2"  (188 cm) Weight: 250 lb (113.4 kg) IBW/kg (Calculated) : 82.2 Heparin Dosing Weight:   Vital Signs: Temp: 97.4 F (36.3 C) (06/02 0315) Temp Source: Axillary (06/02 0315) BP: 110/56 (06/02 0400) Pulse Rate: 72 (06/02 0400)  Labs: Recent Labs    09/10/18 0240  09/11/18 0301 09/11/18 1056 09/12/18 0439  HGB 7.0*  --  8.2*  --   --   HCT 22.0*  --  26.1*  --   --   PLT 91*  --  93*  --   --   HEPARINUNFRC  --    < > 0.43 0.46 0.71*  CREATININE 1.74*  --  1.78*  --   --    < > = values in this interval not displayed.    Estimated Creatinine Clearance: 53.9 mL/min (A) (by C-G formula based on SCr of 1.78 mg/dL (H)).   Medications:  Infusions:  . azithromycin Stopped (09/11/18 1859)  . ceFEPime (MAXIPIME) IV 2 g (09/12/18 0519)  . heparin 1,800 Units/hr (09/12/18 0437)  . sulfamethoxazole-trimethoprim Stopped (09/12/18 0004)    Assessment: Patient with heparin level just above goal.  No heparin issues per RN.  Patient did have bloody nose but RN believes this could be from O2, not major issue.  Goal of Therapy:  Heparin level 0.3-0.7 units/ml Monitor platelets by anticoagulation protocol: Yes   Plan:  Decrease heparin to 1700 units/hr Recheck level at [place], [name] Rose 09/12/2018,5:29 AM

## 2018-09-12 NOTE — Progress Notes (Signed)
This morning, [name] is having some problems with breathing.  He just feels short of breath.  I do not think that this is a pulmonary embolism.  His Dopplers of his legs were negative.  I would think that being on the heparin infusion would be improving his pulmonary status if this is a pulmonary embolism.  He is on the heparin infusion.  I certainly do not see any problems with continuing this for right now.  His labs show a white count 12.3.  Hemoglobin 8.2.  Platelet count 117,000.  I know that he was transfused over the weekend.  His electrolytes show a sodium 138.  Potassium 5.3.  BUN 52 creatinine 1.83.  His calcium is 8.3 with an albumin of 2.1.  Is still not clear as to what is the cause of this pulmonary issue.  I think his cultures so far all negative.  He has been checked for the coronavirus.  This is negative.  I highly doubt that this is all from his Padcev.  I will have to talk to our pharmacist about this.  I suppose that I cannot totally discount Padcev as the issue.  However, our options for treating him really are minimal.  The [place] is working as his last PET scan showed no obvious active disease.  He is not eating much.  There is no diarrhea.  His vital signs show temperature of 97.4.  Pulse is 88.  Blood pressure 130/79.  Oxygen saturation is 100% on the BIPAP.  His lungs sound like he does have decent air movement bilaterally.  There might be some wheezing bilaterally.  Cardiac exam regular rate and rhythm.  He has no murmurs.  Abdomen is soft.  Bowel sounds are slightly decreased.  There is no guarding or rebound tenderness.  There is no abdominal mass.  Extremities shows no clubbing, cyanosis or edema.  Neurological exam is nonfocal.  Mr. Walls has some type of atypical pulmonary infection.  I will know if this is some inflammation.  He is on steroids.  This is a great idea.  He is on some IV antibiotics.  I know that the ICU staff is doing a tremendous job with  him and doing all that he can do to improve his pulmonary status.  [name], MD  [place] 37:14

## 2018-09-12 NOTE — Progress Notes (Signed)
Initial Nutrition Assessment  DOCUMENTATION CODES:   Obesity unspecified  INTERVENTION:   -Advance Vital HP @ 60 ml/hr  -30 ml Prostat BID -This provides 1640 kcal, 156g protein and 1152 ml H2O.  NUTRITION DIAGNOSIS:   Inadequate oral intake related to inability to eat as evidenced by NPO status.  GOAL:   Provide needs based on ASPEN/SCCM guidelines  MONITOR:   Vent status, Labs, Weight trends, TF tolerance, I & O's  REASON FOR ASSESSMENT:   Consult, Ventilator Enteral/tube feeding initiation and management  ASSESSMENT:   68 y.o. male with medical history significant of hypertension, hyperlipidemia, prediabetes, prostate cancer, metastasized bladder cancer on chemotherapy, PAF, CKD stage III, MSSA bacteremia.  Patient presented secondary to significant weakness overnight. Admitted for pneumonia.  Patient is currently intubated on ventilator support MV: 9.5 L/min Temp (24hrs), Avg:97.5 F (36.4 C), Min:97.1 F (36.2 C), Max:97.8 F (36.6 C)  Patient with metastatic bladder cancer, diagnosed in 2018. Pt has been receiving chemotherapy, last treatment 08/28/18. Per chart review, pt has had some difficulty with eating d/t cancer treatments. But most recently pt was eating well PTA. Once admitted this admission pt was placed on a diet but was not eating well since admit.  Patient was intubated this morning and now TF protocol has been initiated. Vital HP is ordered at 40 ml/hr with 30 ml Prostat BID (this provides 1160 kcal and 114g protein). Will adjust to better meet estimated needs.   Per weight records, pt has lost 14 lbs since 3/11 (5% wt loss x 3 months, insignificant for time frame). Per I/O's: +1.2L since admit.  Medications: Vitamin B-6 tablet daily Labs reviewed: CBGs: 211-246 Elevated K, Phos Mg WNL GFR: 37  NUTRITION - FOCUSED PHYSICAL EXAM:  Unable to perform per department requirements to work remotely.  Diet Order:   Diet Order            Diet NPO  time specified  Diet effective now              EDUCATION NEEDS:   Not appropriate for education at this time  Skin:  Skin Assessment: Reviewed RN Assessment  Last BM:  5/30  Height:   Ht Readings from Last 1 Encounters:  09/12/18 $RemoveB'6\' 2"'kVhJuUIP$  (1.88 m)    Weight:   Wt Readings from Last 1 Encounters:  09/08/18 113.4 kg    Ideal Body Weight:  86.3 kg  BMI:  Body mass index is 32.1 kg/m.  Estimated Nutritional Needs:   Kcal:  2336-1224  Protein:  160-170g  Fluid:  1.6L/day  Clayton Bibles, MS, RD, LDN [facility] Dietitian Pager: [phone] After Hours Pager: [phone]

## 2018-09-12 NOTE — Procedures (Signed)
Central Venous Catheter Insertion Procedure Note [name] Rose [id number] [birth year]  Procedure: Insertion of Central Venous Catheter Indications: Drug and/or fluid administration  Procedure Details Consent: Risks of procedure as well as the alternatives and risks of each were explained to the (patient/caregiver).  Consent for procedure obtained. Time Out: Verified patient identification, verified procedure, site/side was marked, verified correct patient position, special equipment/implants available, medications/allergies/relevent history reviewed, required imaging and test results available.  Performed  Maximum sterile technique was used including antiseptics, cap, gloves, gown, hand hygiene, mask and sheet. Skin prep: Chlorhexidine; local anesthetic administered A antimicrobial bonded/coated triple lumen catheter was placed in the left internal jugular vein using the Seldinger technique.  Used ultrasound guidance.  Inserted to 19 cm at skin.  Anchored with 4 sutures.  Evaluation Blood flow good Complications: No apparent complications Patient did tolerate procedure well. Chest X-ray ordered to verify placement.  CXR: pending.  [name] Mires, MD [facility] Pulmonary/Critical Care 09/12/2018, 7:07 PM

## 2018-09-12 NOTE — Progress Notes (Signed)
Pt was sleeping from 0400 - 0600 and his sats were 96-100 and his resp rate was 20. He awakened and started becoming anxious, resp rate 40. He continued to get more anxious.  RT placed on Bipap, NP called, Ativan $RemoveBefore'1mg'WWVhLACzthBAV$  given IV. Will continue to monitor. Hoyle Barr, RN

## 2018-09-12 NOTE — Progress Notes (Signed)
Needing more sedation while on vent.  BP lower.  Didn't respond to fluid challenge.  Will have CVL placed and start pressors.  [name], MD [facility] Pulmonary/Critical Care 09/12/2018, 3:03 PM

## 2018-09-12 NOTE — Progress Notes (Signed)
NAME:  [name] Rose, MRN:  [id number], DOB:  03/19/1951, LOS: 4 ADMISSION DATE:  09/08/2018, CONSULTATION DATE:  [id number] REFERRING MD:  Dr. Sloan Leiter, [facility], CHIEF COMPLAINT:  Short of breath   Brief History   68 yo male former smoker presented to ER with weakness, hypoxia, fever 101.5.  He has hx of metastatic bladder cancer on targeted therapy with Padcev x 3 cycles.  CXR showed pulmonary infiltrates.  COVID testing negative x 2.  He had progressive hypoxia and PCCM asked to assess.  Past Medical History  HTN, HLD, Prostate cancer, PAF, CKD 3, MSSA Bacteremia March 2020, chemotherapy induced neutropenia, moderate mitral regurgitation  Significant Hospital Events   5/29 Admit 5/31 solumedrol started, heparin gtt started, transfuse PRBC 6/01 add bactrim for possible PCP 6/02 VDRF  Consults:  Oncology 5/30 bladder cancer ID 5/31 atypical pneumonia  Procedures:  ETT 6/02 >>   Significant Diagnostic Tests:  Dopper legs b/l 5/31 >> no DVT CT chest 5/31 >> atherosclerosis, extensive GGO with inter and intralobular thickening, fatty liver   Micro Data:  Blood 5/29 >> COVID 5/29 >> negative Urine 5/29 >> insignificant growth COVID 5/29 >> negative Influenza 5/30 >> negative  Antimicrobials:  Vancomycin 5/28 >> Cefepime 5/28 >>  Bactrim 6/01 >>  Interim history/subjective:  Transitioned to Bipap last night.  Feels like he is starting to fatigue with breathing effort.  Objective   Blood pressure 130/79, pulse 88, temperature (!) 97.4 F (36.3 C), temperature source Axillary, resp. rate (!) 37, height _0  (1.88 m), weight 113.4 kg, SpO2 100 %.    FiO2 (%):  [90 %-100 %] 100 %   Intake/Output Summary (Last 24 hours) at 09/12/2018 0824 Last data filed at 09/12/2018 0549 Gross per 24 hour  Intake 2514.68 ml  Output 925 ml  Net 1589.68 ml   Filed Weights   09/08/18 0545  Weight: 113.4 kg    Examination:  General - alert, using accessory muscled for breathing Eyes -  pupils reactive ENT - Bipap on Cardiac - regular rate/rhythm, no murmur Chest - decreased BS, poor air movement, abdominal breathing pattern Abdomen - soft, non tender, + bowel sounds Extremities - no cyanosis, clubbing, or edema Skin - no rashes Neuro - follows commands, moves extremities   Resolved Hospital Problem list     Assessment & Plan:   Acute hypoxic respiratory failure with GGO pulmonary infiltrates. Discussion: Differential at this point is atypical infection (?PCP) versus pulmonary toxicity related to cancer therapy.  While it would be helpful to get further information from bronchoscopy with BAL, at this point it is too risky given his tenuous respiratory status. Plan - WOB much worse 6/02; will proceed with intubation - plan for bronchoscopy after intubation - continue ABx per ID - continue solumedrol 80 mg q6h - goal SpO2 > 90% - f/u CXR, ABG  Paroxysmal atrial fibrillation >> CHADSVASC score 3. Plan - continue heparin gtt  Anemia, thrombocytopenia in setting of metastatic bladder cancer on targeted therapy. Plan - f/u CBC - transfuse for Hb < 7 - oncology following  CKD 3. Hyperkalemia. Plan - monitor potassium while on bactrim  Steroid induced hyperglycemia. Plan - SSI   Best practice:  Diet: tube feeds DVT prophylaxis: Heparin gtt GI prophylaxis: protonix Mobility: Bed rest Code Status: full code Disposition: ICU  Labs    CMP Latest Ref Rng & Units 09/12/2018 09/11/2018 09/10/2018  Glucose 70 - 99 mg/dL 166(H) 196(H) 125(H)  BUN 8 - 23 mg/dL 52(H)  42(H) 36(H)  Creatinine 0.61 - 1.24 mg/dL 1.83(H) 1.78(H) 1.74(H)  Sodium 135 - 145 mmol/L 138 138 136  Potassium 3.5 - 5.1 mmol/L 5.3(H) 4.9 3.1(L)  Chloride 98 - 111 mmol/L 108 107 104  CO2 22 - 32 mmol/L 20(L) 22 22  Calcium 8.9 - 10.3 mg/dL 8.3(L) 8.3(L) 8.1(L)  Total Protein 6.5 - 8.1 g/dL 5.1(L) 5.1(L) 4.8(L)  Total Bilirubin 0.3 - 1.2 mg/dL 0.2(L) 0.7 0.8  Alkaline Phos 38 - 126 U/L 97  81 52  AST 15 - 41 U/L 50(H) 57(H) 64(H)  ALT 0 - 44 U/L 34 34 36   CBC Latest Ref Rng & Units 09/12/2018 09/11/2018 09/10/2018  WBC 4.0 - 10.5 K/uL 12.3(H) 6.0 7.0  Hemoglobin 13.0 - 17.0 g/dL 8.2(L) 8.2(L) 7.0(L)  Hematocrit 39.0 - 52.0 % 25.6(L) 26.1(L) 22.0(L)  Platelets 150 - 400 K/uL 117(L) 93(L) 91(L)   ABG    Component Value Date/Time   PHART 7.431 09/10/2018 1432   PCO2ART 34.5 09/10/2018 1432   PO2ART 51.7 (L) 09/10/2018 1432   HCO3 22.5 09/10/2018 1432   TCO2 29 09/20/2016 0657   ACIDBASEDEF 0.9 09/10/2018 1432   O2SAT 85.7 09/10/2018 1432   CBG (last 3)  Recent Labs    09/11/18 1147 09/11/18 1603 09/11/18 2144  GLUCAP 242* 207* 146*   CC time 33 minutes  [name], MD [facility] Pulmonary/Critical Care 09/12/2018, 8:24 AM

## 2018-09-12 NOTE — Progress Notes (Signed)
Patient ID: [name] Rose, male   DOB: Jul 29, 1950, 68 y.o.   MRN: [id number]         [facility] for Infectious Disease  Date of Admission:  09/08/2018           Day 6 cefepime        Day 3 azithromycin        Day 2 trimethoprim sulfamethoxazole ASSESSMENT: The cause of his recent fever, pulmonary infiltrates and hypoxia remains unclear.  He will undergo bronchoscopy today to try to help determine a cause.  His creatinine and potassium are up slightly today.  I will consider changing trimethoprim sulfamethoxazole to clindamycin and primacquine.  PLAN: 1. Continue current antibiotics for now 2. 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 chlorhexidine gluconate (MEDLINE KIT)  15 mL Mouth Rinse BID  . Chlorhexidine Gluconate Cloth  6 each Topical Daily  . diltiazem  60 mg Per Tube Q6H  . feeding supplement (PRO-STAT SUGAR FREE 64)  30 mL Per Tube BID  . feeding supplement (VITAL HIGH PROTEIN)  1,000 mL Per Tube Q24H  . gabapentin  300 mg Per Tube Q8H  . insulin aspart  3-9 Units Subcutaneous Q4H  . mouth rinse  15 mL Mouth Rinse 10 times per day  . methylPREDNISolone (SOLU-MEDROL) injection  80 mg Intravenous Q6H  . metoprolol tartrate  25 mg Per Tube BID  . pantoprazole sodium  40 mg Per Tube Q24H  . vitamin B-6  300 mg Per Tube Daily   Continuous Infusions: . azithromycin Stopped (09/11/18 1859)  . ceFEPime (MAXIPIME) IV Stopped (09/12/18 0549)  . fentaNYL infusion INTRAVENOUS 25 mcg/hr (09/12/18 0907)  . heparin 1,700 Units/hr (09/12/18 0535)  . sulfamethoxazole-trimethoprim Stopped (09/12/18 0818)   PRN Meds:.acetaminophen **OR** acetaminophen, albuterol, bisacodyl, fentaNYL, metoprolol tartrate,  midazolam, [DISCONTINUED] ondansetron **OR** ondansetron (ZOFRAN) IV, sennosides  Review of Systems: Review of Systems  Unable to perform ROS: Intubated    No Known Allergies  OBJECTIVE: Vitals:   09/12/18 0614 09/12/18 0800 09/12/18 0856 09/12/18 0935  BP: 130/79  (!) 189/91 (!) 176/86  Pulse: 88  (!) 117 (!) 145  Resp: (!) 37  (!) 24   Temp:  (!) 97.4 F (36.3 C)    TempSrc:  Axillary    SpO2: 100%  92%   Weight:      Height:   _0  (1.88 m)    Body mass index is 32.1 kg/m.  Physical Exam Constitutional:      Comments: He developed increased work of breathing this morning and was intubated.  He is now sedated.  Cardiovascular:     Rate and Rhythm: Regular rhythm.     Heart sounds: No murmur.     Comments: Tachycardia. Pulmonary:     Breath sounds: Rales present.     Lab Results Lab Results  Component Value Date   WBC 12.3 (H) 09/12/2018   HGB 8.2 (L) 09/12/2018   HCT 25.6 (L) 09/12/2018   MCV 95.9 09/12/2018   PLT 117 (L) 09/12/2018    Lab Results  Component Value Date   CREATININE 1.83 (H) 09/12/2018   BUN 52 (H) 09/12/2018   NA 138 09/12/2018   K 5.3 (H) 09/12/2018  CL 108 09/12/2018   CO2 20 (L) 09/12/2018    Lab Results  Component Value Date   ALT 34 09/12/2018   AST 50 (H) 09/12/2018   ALKPHOS 97 09/12/2018   BILITOT 0.2 (L) 09/12/2018     Microbiology: Recent Results (from the past 240 hour(s))  Blood Culture (routine x 2)     Status: None (Preliminary result)   Collection Time: 09/08/18  3:58 AM  Result Value Ref Range Status   Specimen Description   Final    BLOOD RIGHT PORTA CATH Performed at [facility] [address] [place], [place] [address]    Special Requests   Final    BOTTLES DRAWN AEROBIC AND ANAEROBIC Blood Culture adequate volume Performed at [facility] [address], [place], [place] [address]    Culture   Final    NO GROWTH 4 DAYS Performed at [facility],  [place] [address], [place], [place] [address]    Report Status PENDING  Incomplete  SARS Coronavirus 2 (CEPHEID - Performed in [place] hospital lab), Hosp Order     Status: None   Collection Time: 09/08/18  3:58 AM  Result Value Ref Range Status   SARS Coronavirus 2 NEGATIVE NEGATIVE Final    Comment: (NOTE) If result is NEGATIVE SARS-CoV-2 target nucleic acids are NOT DETECTED. The SARS-CoV-2 RNA is generally detectable in upper and lower  respiratory specimens during the acute phase of infection. The lowest  concentration of SARS-CoV-2 viral copies this assay can detect is 250  copies / mL. A negative result does not preclude SARS-CoV-2 infection  and should not be used as the sole basis for treatment or other  patient management decisions.  A negative result may occur with  improper specimen collection / handling, submission of specimen other  than nasopharyngeal swab, presence of viral mutation(s) within the  areas targeted by this assay, and inadequate number of viral copies  (<250 copies / mL). A negative result must be combined with clinical  observations, patient history, and epidemiological information. If result is POSITIVE SARS-CoV-2 target nucleic acids are DETECTED. The SARS-CoV-2 RNA is generally detectable in upper and lower  respiratory specimens dur ing the acute phase of infection.  Positive  results are indicative of active infection with SARS-CoV-2.  Clinical  correlation with patient history and other diagnostic information is  necessary to determine patient infection status.  Positive results do  not rule out bacterial infection or co-infection with other viruses. If result is PRESUMPTIVE POSTIVE SARS-CoV-2 nucleic acids MAY BE PRESENT.   A presumptive positive result was obtained on the submitted specimen  and confirmed on repeat testing.  While 2019 novel coronavirus  (SARS-CoV-2) nucleic acids may be present in the submitted sample  additional confirmatory  testing may be necessary for epidemiological  and / or clinical management purposes  to differentiate between  SARS-CoV-2 and other Sarbecovirus currently known to infect humans.  If clinically indicated additional testing with an alternate test  methodology [phone]) is advised. The SARS-CoV-2 RNA is generally  detectable in upper and lower respiratory sp ecimens during the acute  phase of infection. The expected result is Negative. Fact Sheet for Patients:  StrictlyIdeas.no Fact Sheet for Healthcare Providers: BankingDealers.co.za This test is not yet approved or cleared by the [place] FDA and has been authorized for detection and/or diagnosis of SARS-CoV-2 by FDA under an Emergency Use Authorization (EUA).  This EUA will remain in effect (meaning this  test can be used) for the duration of the COVID-19 declaration under Section 564(b)(1) of the Act, 21 U.S.C. section 360bbb-3(b)(1), unless the authorization is terminated or revoked sooner. Performed at [facility], [place] [address], [place], [place] [address]   Blood Culture (routine x 2)     Status: None (Preliminary result)   Collection Time: 09/08/18  4:43 AM  Result Value Ref Range Status   Specimen Description   Final    BLOOD LEFT ANTECUBITAL Performed at [facility] [address] [place], [place] [address]    Special Requests   Final    BOTTLES DRAWN AEROBIC AND ANAEROBIC Blood Culture adequate volume Performed at [facility] [address] [place], [place] [address]    Culture   Final    NO GROWTH 4 DAYS Performed at [facility], [place] [address] [place], [place] [address]    Report Status PENDING  Incomplete  Urine culture     Status: Abnormal   Collection Time: 09/08/18  8:25 AM  Result Value Ref Range Status   Specimen Description   Final    URINE, RANDOM Performed at [facility] [address] [place], [place] [address]    Special Requests   Final    NONE Performed at [facility], [place] [address], [place], [place] [address]    Culture (A)  Final    <10,000 COLONIES/mL INSIGNIFICANT GROWTH Performed at [facility] [address], [place],  [address]    Report Status 09/09/2018 FINAL  Final  MRSA PCR Screening     Status: None   Collection Time: 09/08/18  8:52 AM  Result Value Ref Range Status   MRSA by PCR NEGATIVE NEGATIVE Final    Comment:        The GeneXpert MRSA Assay (FDA approved for NASAL specimens only), is one component of a comprehensive MRSA colonization surveillance program. It is not intended to diagnose MRSA infection nor to guide or monitor treatment for MRSA infections. Performed at [facility], [place] [address], [place],  [address]   Novel Coronavirus,NAA,(SEND-OUT TO REF LAB - TAT 24-48 hrs); Hosp Order     Status: None   Collection Time: 09/08/18 10:47 AM  Result Value Ref Range Status   SARS-CoV-2, NAA NOT DETECTED NOT DETECTED Final    Comment: (NOTE) This test was developed and its performance characteristics determined by [employer]. This test has not been FDA cleared or approved. This test has been authorized by FDA under an Emergency Use Authorization (EUA). This test is only authorized for the duration of time the declaration that circumstances exist justifying the authorization of the emergency use of in vitro diagnostic tests for detection of SARS-CoV-2 virus and/or diagnosis of COVID-19 infection under section 564(b)(1) of the Act, 21 U.S.C. 301SWF-0(X)(3), unless the authorization is terminated or revoked sooner. When diagnostic testing is negative, the possibility of a false negative result should be considered in the context of a patient's recent exposures and the presence of clinical signs and symptoms consistent with COVID-19. An  individual without symptoms of COVID-19 and who is not shedding SARS-CoV-2 virus would expect to have a negative (not detected) result in this assay. Performed  At: [facility] [place], [place] [id number] Rush Farmer MD UR:[id number]    [place]  Final    Comment: Performed at [facility] [address]  [place], [place] [address]    Michel Bickers, [place] [facility] [phone] pager   [phone] cell 09/12/2018, 11:29 AM

## 2018-09-13 ENCOUNTER — Inpatient Hospital Stay (HOSPITAL_COMMUNITY): Payer: PPO

## 2018-09-13 DIAGNOSIS — R000 Tachycardia, unspecified: Secondary | ICD-10-CM

## 2018-09-13 DIAGNOSIS — N289 Disorder of kidney and ureter, unspecified: Secondary | ICD-10-CM

## 2018-09-13 DIAGNOSIS — E875 Hyperkalemia: Secondary | ICD-10-CM

## 2018-09-13 LAB — COMPREHENSIVE METABOLIC PANEL
ALT: 32 U/L (ref 0–44)
AST: 43 U/L — ABNORMAL HIGH (ref 15–41)
Albumin: 2.1 g/dL — ABNORMAL LOW (ref 3.5–5.0)
Alkaline Phosphatase: 71 U/L (ref 38–126)
Anion gap: 7 (ref 5–15)
BUN: 71 mg/dL — ABNORMAL HIGH (ref 8–23)
CO2: 19 mmol/L — ABNORMAL LOW (ref 22–32)
Calcium: 7.8 mg/dL — ABNORMAL LOW (ref 8.9–10.3)
Chloride: 107 mmol/L (ref 98–111)
Creatinine, Ser: 2.76 mg/dL — ABNORMAL HIGH (ref 0.61–1.24)
GFR calc Af Amer: 26 mL/min — ABNORMAL LOW (ref >60)
GFR calc non Af Amer: 23 mL/min — ABNORMAL LOW (ref >60)
Glucose, Bld: 164 mg/dL — ABNORMAL HIGH (ref 70–99)
Potassium: 5.4 mmol/L — ABNORMAL HIGH (ref 3.5–5.1)
Sodium: 133 mmol/L — ABNORMAL LOW (ref 135–145)
Total Bilirubin: 0.3 mg/dL (ref 0.3–1.2)
Total Protein: 4.8 g/dL — ABNORMAL LOW (ref 6.5–8.1)

## 2018-09-13 LAB — BLOOD GAS, ARTERIAL
Acid-base deficit: 7.8 mmol/L — ABNORMAL HIGH (ref 0.0–2.0)
Bicarbonate: 18.7 mmol/L — ABNORMAL LOW (ref 20.0–28.0)
Drawn by: 560031
FIO2: 70
MECHVT: 570 mL
O2 Saturation: 87.7 %
PEEP: 10 cmH2O
Patient temperature: 98.6
RATE: 30 resp/min
pCO2 arterial: 45.5 mmHg (ref 32.0–48.0)
pH, Arterial: 7.237 — ABNORMAL LOW (ref 7.350–7.450)
pO2, Arterial: 65.7 mmHg — ABNORMAL LOW (ref 83.0–108.0)

## 2018-09-13 LAB — CBC WITH DIFFERENTIAL/PLATELET
Abs Immature Granulocytes: 0.53 10*3/uL — ABNORMAL HIGH (ref 0.00–0.07)
Basophils Absolute: 0 10*3/uL (ref 0.0–0.1)
Basophils Relative: 0 %
Eosinophils Absolute: 0 10*3/uL (ref 0.0–0.5)
Eosinophils Relative: 0 %
HCT: 24.6 % — ABNORMAL LOW (ref 39.0–52.0)
Hemoglobin: 7.8 g/dL — ABNORMAL LOW (ref 13.0–17.0)
Immature Granulocytes: 3 %
Lymphocytes Relative: 7 %
Lymphs Abs: 1.2 10*3/uL (ref 0.7–4.0)
MCH: 31.5 pg (ref 26.0–34.0)
MCHC: 31.7 g/dL (ref 30.0–36.0)
MCV: 99.2 fL (ref 80.0–100.0)
Monocytes Absolute: 0.6 10*3/uL (ref 0.1–1.0)
Monocytes Relative: 4 %
Neutro Abs: 14 10*3/uL — ABNORMAL HIGH (ref 1.7–7.7)
Neutrophils Relative %: 86 %
Platelets: 139 10*3/uL — ABNORMAL LOW (ref 150–400)
RBC: 2.48 MIL/uL — ABNORMAL LOW (ref 4.22–5.81)
RDW: 17.2 % — ABNORMAL HIGH (ref 11.5–15.5)
WBC: 16.3 10*3/uL — ABNORMAL HIGH (ref 4.0–10.5)
nRBC: 2.5 % — ABNORMAL HIGH (ref 0.0–0.2)

## 2018-09-13 LAB — CULTURE, BLOOD (ROUTINE X 2)
Culture: NO GROWTH
Culture: NO GROWTH
Special Requests: ADEQUATE
Special Requests: ADEQUATE

## 2018-09-13 LAB — PHOSPHORUS
Phosphorus: 4.4 mg/dL (ref 2.5–4.6)
Phosphorus: 5.9 mg/dL — ABNORMAL HIGH (ref 2.5–4.6)

## 2018-09-13 LAB — GLUCOSE, CAPILLARY
Glucose-Capillary: 132 mg/dL — ABNORMAL HIGH (ref 70–99)
Glucose-Capillary: 176 mg/dL — ABNORMAL HIGH (ref 70–99)
Glucose-Capillary: 187 mg/dL — ABNORMAL HIGH (ref 70–99)
Glucose-Capillary: 171 mg/dL — ABNORMAL HIGH (ref 70–99)
Glucose-Capillary: 192 mg/dL — ABNORMAL HIGH (ref 70–99)
Glucose-Capillary: 167 mg/dL — ABNORMAL HIGH (ref 70–99)

## 2018-09-13 LAB — MYCOPLASMA PNEUMONIAE ANTIBODY, IGM: Mycoplasma pneumo IgM: <770 U/mL (ref 0–769)

## 2018-09-13 LAB — MAGNESIUM
Magnesium: 2 mg/dL (ref 1.7–2.4)
Magnesium: 2 mg/dL (ref 1.7–2.4)

## 2018-09-13 LAB — PNEUMOCYSTIS JIROVECI SMEAR BY DFA: Pneumocystis jiroveci Ag: NEGATIVE

## 2018-09-13 LAB — HEPARIN LEVEL (UNFRACTIONATED): Heparin Unfractionated: 0.66 IU/mL (ref 0.30–0.70)

## 2018-09-13 MED ORDER — SODIUM CHLORIDE 0.9% FLUSH: 10.0000 mL | INTRAVENOUS | Status: DC | PRN

## 2018-09-13 MED ORDER — SODIUM CHLORIDE 0.9 % IV SOLN
0.0000 ug/min | INTRAVENOUS | Status: DC
  Administered 2018-09-13: 100 ug/min via INTRAVENOUS
  Filled 2018-09-13 (×2): qty 4

## 2018-09-13 MED ORDER — PHENYLEPHRINE HCL-NACL 40-0.9 MG/250ML-% IV SOLN
0.0000 ug/min | INTRAVENOUS | Status: DC
  Administered 2018-09-13: 90 ug/min via INTRAVENOUS
  Administered 2018-09-13: 20 ug/min via INTRAVENOUS
  Filled 2018-09-13: qty 250

## 2018-09-13 MED ORDER — CHLORHEXIDINE GLUCONATE CLOTH 2 % EX PADS
6.0000 | MEDICATED_PAD | Freq: Every day | CUTANEOUS | Status: DC
  Administered 2018-09-13 – 2018-09-16 (×2): 6 via TOPICAL

## 2018-09-13 MED ORDER — SODIUM CHLORIDE 0.9 % IV SOLN
0.0000 ug/h | INTRAVENOUS | Status: DC
  Administered 2018-09-13 (×2): 250 ug/h via INTRAVENOUS
  Administered 2018-09-13: 14:00:00 25 ug/h via INTRAVENOUS
  Administered 2018-09-14 – 2018-09-15 (×3): 200 ug/h via INTRAVENOUS
  Administered 2018-09-16 (×2): 300 ug/h via INTRAVENOUS
  Administered 2018-09-16: 200 ug/h via INTRAVENOUS
  Administered 2018-09-17: 07:00:00 300 ug/h via INTRAVENOUS
  Administered 2018-09-17 – 2018-09-18 (×3): 350 ug/h via INTRAVENOUS
  Administered 2018-09-18: 300 ug/h via INTRAVENOUS
  Administered 2018-09-18: 16:00:00 200 ug/h via INTRAVENOUS
  Administered 2018-09-19: 300 ug/h via INTRAVENOUS
  Administered 2018-09-19 – 2018-09-20 (×2): 275 ug/h via INTRAVENOUS
  Filled 2018-09-13 (×19): qty 50

## 2018-09-13 NOTE — Progress Notes (Signed)
ANTICOAGULATION CONSULT NOTE - Follow Up Consult  Pharmacy Consult for Heparin Indication: presumed PE  No Known Allergies  Patient Measurements: Height: 6\' 2"  (188 cm) Weight: 250 lb (113.4 kg) IBW/kg (Calculated) : 82.2 Heparin Dosing Weight:   Vital Signs: Temp: 98 F (36.7 C) (06/03 0000) Temp Source: Axillary (06/03 0000) BP: 95/67 (06/02 2335) Pulse Rate: 65 (06/02 2335)  Labs: Recent Labs    09/10/18 0240  09/11/18 0301  09/12/18 0439 09/12/18 1000 09/12/18 2330  HGB 7.0*  --  8.2*  --  8.2*  --   --   HCT 22.0*  --  26.1*  --  25.6*  --   --   PLT 91*  --  93*  --  117*  --   --   HEPARINUNFRC  --    < > 0.43   < > 0.71* 1.00* 0.33  CREATININE 1.74*  --  1.78*  --  1.83*  --   --    < > = values in this interval not displayed.    Estimated Creatinine Clearance: 52.5 mL/min (A) (by C-G formula based on SCr of 1.83 mg/dL (H)).   Medications:  Infusions:  . azithromycin Stopped (09/12/18 2120)  . ceFEPime (MAXIPIME) IV Stopped (09/12/18 2017)  . feeding supplement (VITAL HIGH PROTEIN) 1,000 mL (09/12/18 1345)  . fentaNYL infusion INTRAVENOUS 225 mcg/hr (09/13/18 0059)  . heparin 1,700 Units/hr (09/12/18 2314)  . phenylephrine (NEO-SYNEPHRINE) Adult infusion    . sulfamethoxazole-trimethoprim Stopped (09/12/18 2314)    Assessment: Patient with heparin level at goal.  No heparin issues noted.  Goal of Therapy:  Heparin level 0.3-0.7 units/ml Monitor platelets by anticoagulation protocol: Yes   Plan:  Continue heparin drip at current rate Recheck level at 0800  Tyler Deis, Shea Stakes [place] 09/13/2018,1:00 AM

## 2018-09-13 NOTE — Progress Notes (Addendum)
Patient ID: [name] Rose, male   DOB: [birth year], 68 y.o.   MRN: [id number]         [facility] for Infectious Disease  Date of Admission:  09/08/2018           Day 7 cefepime        Day 4 azithromycin        Day 3 trimethoprim sulfamethoxazole ASSESSMENT: The cause of his recent fever, pulmonary infiltrates and hypoxia remains unclear.  He underwent bronchoscopy and BAL yesterday.  No suspicious lesions or unusual secretions were noted.  No organisms were seen on Gram stain of BAL fluid and his pneumocystis antigen is negative.  He has developed acute renal insufficiency and hyperkalemia.  I will stop her Trimethoprim/Sulfamethoxazole allow  PLAN: 1. Continue cefepime and azithromycin for now 2. Discontinue trimethoprim sulfamethoxazole 3. Await results of outstanding BAL stains and cultures  Principal Problem:   Pneumonia Active Problems:   AKI (acute kidney injury) ([place])   Acute respiratory failure with hypoxia (HCC)   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 (HCC)   LFT elevation   CKD (chronic kidney disease), stage III (HCC)   Scheduled Meds:  chlorhexidine gluconate (MEDLINE KIT)  15 mL Mouth Rinse BID   Chlorhexidine Gluconate Cloth  6 each Topical Daily   diltiazem  30 mg Per Tube Q8H   feeding supplement (PRO-STAT SUGAR FREE 64)  30 mL Per Tube BID   gabapentin  300 mg Per Tube Q8H   insulin aspart  3-9 Units Subcutaneous Q4H   mouth rinse  15 mL Mouth Rinse 10 times per day   methylPREDNISolone (SOLU-MEDROL) injection  80 mg Intravenous Q6H   metoprolol tartrate  12.5 mg Per Tube BID   pantoprazole sodium  40 mg Per Tube Q24H   vitamin B-6  300 mg Per Tube Daily   Continuous Infusions:  azithromycin Stopped (09/12/18 2120)   ceFEPime (MAXIPIME) IV Stopped (09/13/18 0543)   feeding supplement (VITAL HIGH PROTEIN) 1,000 mL (09/13/18 0947)   fentaNYL infusion INTRAVENOUS 250 mcg/hr (09/13/18 1500)   heparin 1,700 Units/hr  (09/13/18 1500)   phenylephrine (NEO-SYNEPHRINE) Adult infusion 30 mcg/min (09/13/18 1612)   PRN Meds:.acetaminophen **OR** acetaminophen, albuterol, bisacodyl, fentaNYL, metoprolol tartrate, midazolam, [DISCONTINUED] ondansetron **OR** ondansetron (ZOFRAN) IV, sennosides  Review of Systems: Review of Systems  Unable to perform ROS: Intubated    No Known Allergies  OBJECTIVE: Vitals:   09/13/18 1500 09/13/18 1523 09/13/18 1530 09/13/18 1545  BP: 119/70  125/69 121/77  Pulse: 90  88 99  Resp: 14  20 (!) 22  Temp:      TempSrc:      SpO2: 98% 95% 98% (!) 85%  Weight:      Height: _0  (1.88 m)      Body mass index is 32.75 kg/m.  Physical Exam Constitutional:      Comments: He remains intubated.  He opens his eyes but does not follow commands.  He is slightly agitated.  Cardiovascular:     Rate and Rhythm: Regular rhythm.     Heart sounds: No murmur.     Comments: Tachycardia. Pulmonary:     Breath sounds: Normal breath sounds.  Skin:    Comments: Flushing over face and chest.     Lab Results Lab Results  Component Value Date   WBC 16.3 (H) 09/13/2018   HGB 7.8 (L) 09/13/2018   HCT 24.6 (L) 09/13/2018   MCV 99.2 09/13/2018   PLT 139 (L) 09/13/2018  Lab Results  Component Value Date   CREATININE 2.76 (H) 09/13/2018   BUN 71 (H) 09/13/2018   NA 133 (L) 09/13/2018   K 5.4 (H) 09/13/2018   CL 107 09/13/2018   CO2 19 (L) 09/13/2018    Lab Results  Component Value Date   ALT 32 09/13/2018   AST 43 (H) 09/13/2018   ALKPHOS 71 09/13/2018   BILITOT 0.3 09/13/2018     Microbiology: Recent Results (from the past 240 hour(s))  Blood Culture (routine x 2)     Status: None   Collection Time: 09/08/18  3:58 AM  Result Value Ref Range Status   Specimen Description   Final    BLOOD RIGHT PORTA CATH Performed at [facility] [address] [place], [place] [address]    Special Requests   Final    BOTTLES DRAWN AEROBIC AND ANAEROBIC  Blood Culture adequate volume Performed at [facility] [address], [place], [place] [address]    Culture   Final    NO GROWTH 5 DAYS Performed at [facility], [place] [address] [place], [place] [address]    Report Status 09/13/2018 FINAL  Final  SARS Coronavirus 2 (CEPHEID - Performed in [facility] hospital lab), Hosp Order     Status: None   Collection Time: 09/08/18  3:58 AM  Result Value Ref Range Status   SARS Coronavirus 2 NEGATIVE NEGATIVE Final    Comment: (NOTE) If result is NEGATIVE SARS-CoV-2 target nucleic acids are NOT DETECTED. The SARS-CoV-2 RNA is generally detectable in upper and lower  respiratory specimens during the acute phase of infection. The lowest  concentration of SARS-CoV-2 viral copies this assay can detect is 250  copies / mL. A negative result does not preclude SARS-CoV-2 infection  and should not be used as the sole basis for treatment or other  patient management decisions.  A negative result may occur with  improper specimen collection / handling, submission of specimen other  than nasopharyngeal swab, presence of viral mutation(s) within the  areas targeted by this assay, and inadequate number of viral copies  (<250 copies / mL). A negative result must be combined with clinical  observations, patient history, and epidemiological information. If result is POSITIVE SARS-CoV-2 target nucleic acids are DETECTED. The SARS-CoV-2 RNA is generally detectable in upper and lower  respiratory specimens dur ing the acute phase of infection.  Positive  results are indicative of active infection with SARS-CoV-2.  Clinical  correlation with patient history and other diagnostic information is  necessary to determine patient infection status.  Positive results do  not rule out bacterial infection or co-infection with other viruses. If result is PRESUMPTIVE POSTIVE SARS-CoV-2 nucleic acids MAY BE PRESENT.   A presumptive positive  result was obtained on the submitted specimen  and confirmed on repeat testing.  While 2019 novel coronavirus  (SARS-CoV-2) nucleic acids may be present in the submitted sample  additional confirmatory testing may be necessary for epidemiological  and / or clinical management purposes  to differentiate between  SARS-CoV-2 and other Sarbecovirus currently known to infect humans.  If clinically indicated additional testing with an alternate test  methodology [phone]) is advised. The SARS-CoV-2 RNA is generally  detectable in upper and lower respiratory sp ecimens during the acute  phase of infection. The expected result is Negative. Fact Sheet for Patients:  StrictlyIdeas.no Fact Sheet for Healthcare Providers: BankingDealers.co.za This test is not yet approved or cleared by the  [place] Architectural technologist and has been authorized for detection and/or diagnosis of SARS-CoV-2 by FDA under an Print production planner (EUA).  This EUA will remain in effect (meaning this test can be used) for the duration of the COVID-19 declaration under Section 564(b)(1) of the Act, 21 U.S.C. section 360bbb-3(b)(1), unless the authorization is terminated or revoked sooner. Performed at [facility], [place] [address] [place], [place] [address]   Blood Culture (routine x 2)     Status: None   Collection Time: 09/08/18  4:43 AM  Result Value Ref Range Status   Specimen Description   Final    BLOOD LEFT ANTECUBITAL Performed at [facility] [address], [place], [place] [address]    Special Requests   Final    BOTTLES DRAWN AEROBIC AND ANAEROBIC Blood Culture adequate volume Performed at [facility] [address], [place], [place] [address]    Culture   Final    NO GROWTH 5 DAYS Performed at [facility], [place] [address] [place], [place] [address]    Report Status 09/13/2018 FINAL  Final  Urine  culture     Status: Abnormal   Collection Time: 09/08/18  8:25 AM  Result Value Ref Range Status   Specimen Description   Final    URINE, RANDOM Performed at [facility] [address], [place], [place] [address]    Special Requests   Final    NONE Performed at [facility], [place] [address], [place], [place] [address]    Culture (A)  Final    <10,000 COLONIES/mL INSIGNIFICANT GROWTH Performed at [facility] [address], [place], [place] [address]    Report Status 09/09/2018 FINAL  Final  MRSA PCR Screening     Status: None   Collection Time: 09/08/18  8:52 AM  Result Value Ref Range Status   MRSA by PCR NEGATIVE NEGATIVE Final    Comment:        The GeneXpert MRSA Assay (FDA approved for NASAL specimens only), is one component of a comprehensive MRSA colonization surveillance program. It is not intended to diagnose MRSA infection nor to guide or monitor treatment for MRSA infections. Performed at [facility], [place] [address], [place],  [address]   Novel Coronavirus,NAA,(SEND-OUT TO REF LAB - TAT 24-48 hrs); Hosp Order     Status: None   Collection Time: 09/08/18 10:47 AM  Result Value Ref Range Status   SARS-CoV-2, NAA NOT DETECTED NOT DETECTED Final    Comment: (NOTE) This test was developed and its performance characteristics determined by [employer]. This test has not been FDA cleared or approved. This test has been authorized by FDA under an Emergency Use Authorization (EUA). This test is only authorized for the duration of time the declaration that circumstances exist justifying the authorization of the emergency use of in vitro diagnostic tests for detection of SARS-CoV-2 virus and/or diagnosis of COVID-19 infection under section 564(b)(1) of the Act, 21 U.S.C. 599JTT-0(V)(7), unless the authorization is terminated or revoked sooner. When diagnostic testing is negative, the  possibility of a false negative result should be considered in the context of a patient's recent exposures and the presence of clinical signs and symptoms consistent with COVID-19. An individual without symptoms of COVID-19 and who is not shedding SARS-CoV-2 virus would expect to have a negative (not detected) result in this assay. Performed  At: [facility] [address]  [place], [place] [id number] Rush Farmer MD AO:[id number]    Coronavirus Source NASOPHARYNGEAL  Final    Comment: Performed at [facility] [address], [place], [place] [address]  Culture, bal-quantitative     Status: None (Preliminary result)   Collection Time: 09/12/18  2:30 PM  Result Value Ref Range Status   Specimen Description   Final    BRONCHIAL ALVEOLAR LAVAGE Performed at [facility] [address] [place], [place] [address]    Special Requests   Final    Normal Performed at [facility], [place] [address] [place], [place] [address]    Gram Stain   Final    FEW WBC PRESENT, PREDOMINANTLY PMN RARE SQUAMOUS EPITHELIAL CELLS PRESENT NO ORGANISMS SEEN    Culture   Final    CULTURE REINCUBATED FOR BETTER GROWTH Performed at [facility], [place] [address], [place], [place] [address]    Report Status PENDING  Incomplete  Pneumocystis smear by DFA     Status: None   Collection Time: 09/12/18  2:30 PM  Result Value Ref Range Status   Specimen Source-PJSRC BRONCHIAL ALVEOLAR LAVAGE  Final   Pneumocystis jiroveci Ag NEGATIVE  Final    Comment: Performed at [facility] Performed at [facility], [place] [address], [place],  [address]     Michel Bickers, [place] for [facility] [phone] pager   [phone] cell 09/13/2018, 4:28 PM

## 2018-09-13 NOTE — Progress Notes (Signed)
eLink Physician-Brief Progress Note Patient Name: [name] Rose DOB: 10-18-1950 MRN: [id number]   Date of Service  09/13/2018  HPI/Events of Note  Agitation - Request for orders for bilateral soft wrist restraints and  Continuous sedation.   eICU Interventions  Will order: 1. Bilateral soft wrist restraints.  2. Fentanyl IV infusion. Titrate to RASS = 0 to -1.      Intervention Category Major Interventions: Delirium, psychosis, severe agitation - evaluation and management  [name],[name] 09/13/2018, 12:49 AM

## 2018-09-13 NOTE — Progress Notes (Addendum)
NAME:  [name] Rose, MRN:  [id number], DOB:  Oct 25, 1950, LOS: 5 ADMISSION DATE:  09/08/2018, CONSULTATION DATE:  [id number] REFERRING MD:  Dr. Sloan Leiter, [facility], CHIEF COMPLAINT:  Short of breath   Brief History   68 yo male former smoker presented to ER with weakness, hypoxia, fever 101.5.  He has hx of metastatic bladder cancer on targeted therapy with Padcev x 3 cycles.  CXR showed pulmonary infiltrates.  COVID testing negative x 2.  He had progressive hypoxia and PCCM asked to assess.  Past Medical History  HTN, HLD, Prostate cancer, PAF, CKD 3, MSSA Bacteremia March 2020, chemotherapy induced neutropenia, moderate mitral regurgitation  Significant Hospital Events   5/29 Admit 5/31 solumedrol started, heparin gtt started, transfuse PRBC 6/01 add bactrim for possible PCP 6/02 VDRF 6/3-agitated off sedation  Consults:  Oncology 5/30 bladder cancer ID 5/31 atypical pneumonia  Procedures:  ETT 6/02 >>  Bronchoscopy 6/02>>  Significant Diagnostic Tests:  Dopper legs b/l 5/31 >> no DVT CT chest 5/31 >> atherosclerosis, extensive GGO with inter and intralobular thickening, fatty liver   Micro Data:  Blood 5/29 >> COVID 5/29 >> negative Urine 5/29 >> insignificant growth COVID 5/29 >> negative Influenza 5/30 >> negative  No organisms on BAL so far 6/03 Antimicrobials:  Vancomycin 5/28 >> Cefepime 5/28 >>  Bactrim 6/01 >>  Interim history/subjective:  Transitioned to Bipap last night.  Feels like he is starting to fatigue with breathing effort.  Objective   Blood pressure 113/70, pulse 78, temperature 98.2 F (36.8 C), temperature source Oral, resp. rate 19, height _0  (1.88 m), weight 115.7 kg, SpO2 98 %.    Vent Mode: PRVC FiO2 (%):  [50 %-80 %] 60 % Set Rate:  [30 bmp] 30 bmp Vt Set:  [490 mL-570 mL] 570 mL PEEP:  [10 cmH20] 10 cmH20 Plateau Pressure:  [24 cmH20-33 cmH20] 31 cmH20   Intake/Output Summary (Last 24 hours) at 09/13/2018 1006 Last data filed at  09/13/2018 0900 Gross per 24 hour  Intake 5302.65 ml  Output 650 ml  Net 4652.65 ml   Filed Weights   09/08/18 0545 09/13/18 0500  Weight: 113.4 kg 115.7 kg    Examination: Sedated on vent Pupils reactive Endotracheal tube in place S1-S2 appreciated Decreased breath sounds bilaterally, some rhonchi Abdomen is soft, bowel sounds appreciated Extremities shows no clubbing no edema Skin is warm and dry  Chest x-ray 09/13/2018-bilateral infiltrates, unchanged, reviewed by myself  Resolved Hospital Problem list     Assessment & Plan:   Acute hypoxemic respiratory failure with groundglass opacities Post bronchoscopy 09/12/2018 -Cultures pending at present -Continue current antibiotics -Continue steroids -We will follow arterial blood gas last gas still revealed hypercapnic and hypoxemic failure  Paroxysmal atrial fibrillation -Continue heparin gtt  Anemia, thrombocytopenia in setting of metastatic bladder cancer on targeted therapy -Trend hemoglobin -Oncology following  Chronic kidney disease Hypokalemia -Trend electrolytes  Hyperglycemia -SSI  No changes to antibiotic therapy at the present time Not ready for extubation as he still requires 70% FiO2 Daily weaning by protocol   Best practice:  Diet: Continue tube feeds DVT prophylaxis: Heparin gtt GI prophylaxis: protonix Mobility: Bed rest Code Status: full code Disposition: ICU  Labs    CMP Latest Ref Rng & Units 09/13/2018 09/12/2018 09/11/2018  Glucose 70 - 99 mg/dL 164(H) 166(H) 196(H)  BUN 8 - 23 mg/dL 71(H) 52(H) 42(H)  Creatinine 0.61 - 1.24 mg/dL 2.76(H) 1.83(H) 1.78(H)  Sodium 135 - 145 mmol/L 133(L) 138 138  Potassium 3.5 - 5.1 mmol/L 5.4(H) 5.3(H) 4.9  Chloride 98 - 111 mmol/L 107 108 107  CO2 22 - 32 mmol/L 19(L) 20(L) 22  Calcium 8.9 - 10.3 mg/dL 7.8(L) 8.3(L) 8.3(L)  Total Protein 6.5 - 8.1 g/dL 4.8(L) 5.1(L) 5.1(L)  Total Bilirubin 0.3 - 1.2 mg/dL 0.3 0.2(L) 0.7  Alkaline Phos 38 - 126 U/L 71 97  81  AST 15 - 41 U/L 43(H) 50(H) 57(H)  ALT 0 - 44 U/L 32 34 34   CBC Latest Ref Rng & Units 09/13/2018 09/12/2018 09/11/2018  WBC 4.0 - 10.5 K/uL 16.3(H) 12.3(H) 6.0  Hemoglobin 13.0 - 17.0 g/dL 7.8(L) 8.2(L) 8.2(L)  Hematocrit 39.0 - 52.0 % 24.6(L) 25.6(L) 26.1(L)  Platelets 150 - 400 K/uL 139(L) 117(L) 93(L)   ABG    Component Value Date/Time   PHART 7.200 (L) 09/12/2018 1314   PCO2ART 49.8 (H) 09/12/2018 1314   PO2ART 77.8 (L) 09/12/2018 1314   HCO3 18.7 (L) 09/12/2018 1314   TCO2 29 09/20/2016 0657   ACIDBASEDEF 8.7 (H) 09/12/2018 1314   O2SAT 91.4 09/12/2018 1314   CBG (last 3)  Recent Labs    09/12/18 2323 09/13/18 0328 09/13/18 0725  GLUCAP 221* 132* 176*   The patient is critically ill with multiple organ system failure and requires high complexity decision making for assessment and support, frequent evaluation and titration of therapies, advanced monitoring, review of radiographic studies and interpretation of complex data.    Critical Care Time devoted to patient care services, exclusive of separately billable procedures, described in this note is 30 minutes.  Sherrilyn Rist, MD [place], PCCM Cell: [phone]  Addendum 11:42 AM Updated spouse about ongoing care

## 2018-09-13 NOTE — Progress Notes (Signed)
[facility] for IV heparin Indication: presumed PE  No Known Allergies  Patient Measurements: Height: 6\' 2"  (188 cm) Weight: 255 lb 1.2 oz (115.7 kg) IBW/kg (Calculated) : 82.2 Heparin Dosing Weight: 105.8  Vital Signs: Temp: 98.2 F (36.8 C) (06/03 0800) Temp Source: Oral (06/03 0800) BP: 113/70 (06/03 0900) Pulse Rate: 78 (06/03 0900)  Labs: Recent Labs    09/11/18 0301  09/12/18 0439 09/12/18 1000 09/12/18 2330 09/13/18 0500 09/13/18 0800  HGB 8.2*  --  8.2*  --   --  7.8*  --   HCT 26.1*  --  25.6*  --   --  24.6*  --   PLT 93*  --  117*  --   --  139*  --   HEPARINUNFRC 0.43   < > 0.71* 1.00* 0.33  --  0.66  CREATININE 1.78*  --  1.83*  --   --  2.76*  --    < > = values in this interval not displayed.    Estimated Creatinine Clearance: 35.1 mL/min (A) (by C-G formula based on SCr of 2.76 mg/dL (H)).  Assessment: 68 yo with HAP, metastatic bladder cancer on chemo with last cycle completed on 5/18. Now to start IV heparin per Md orders for presumed PE to confirm via CT. Hgb low at 7 but stable since admission. Plts also low at 91k but stable since admission. SCr 1.74 which is trending down est CrCl 55 ml/min. No anticoagulant prescribed PTA  09/13/2018  Heparin level remains therapeutic at 0.66 on 1700 units/hr Hg 7.8.  transfused 5/31. PLTC coming up> 139 today.  Yesterday RN reported small amount of bleeding from mouth when turning patient.   Goal of Therapy:  Heparin level 0.3-0.7 units/ml Monitor platelets by anticoagulation protocol: Yes   Plan:  Continue heparin at 1700 units/hr  Daily CBC and heparin level  Eudelia Bunch, Pharm.D [phone] 09/13/2018 9:50 AM

## 2018-09-13 NOTE — Progress Notes (Signed)
eLink Physician-Brief Progress Note Patient Name: [name] Rose DOB: Jul 01, 1950 MRN: [id number]   Date of Service  09/13/2018  HPI/Events of Note  Oliguria - Bladder scan with 350 mL residual.   eICU Interventions  Will order: 1. I/O Cath PRN.      Intervention Category Intermediate Interventions: Oliguria - evaluation and management  Amed Datta [place] 09/13/2018, 5:52 AM

## 2018-09-14 ENCOUNTER — Inpatient Hospital Stay (HOSPITAL_COMMUNITY): Payer: PPO

## 2018-09-14 DIAGNOSIS — B49 Unspecified mycosis: Secondary | ICD-10-CM

## 2018-09-14 DIAGNOSIS — J9600 Acute respiratory failure, unspecified whether with hypoxia or hypercapnia: Secondary | ICD-10-CM

## 2018-09-14 DIAGNOSIS — N179 Acute kidney failure, unspecified: Secondary | ICD-10-CM

## 2018-09-14 DIAGNOSIS — R451 Restlessness and agitation: Secondary | ICD-10-CM

## 2018-09-14 LAB — CBC WITH DIFFERENTIAL/PLATELET
Abs Immature Granulocytes: 0.13 10*3/uL — ABNORMAL HIGH (ref 0.00–0.07)
Basophils Absolute: 0 10*3/uL (ref 0.0–0.1)
Basophils Relative: 0 %
Eosinophils Absolute: 0 10*3/uL (ref 0.0–0.5)
Eosinophils Relative: 0 %
HCT: 23.7 % — ABNORMAL LOW (ref 39.0–52.0)
Hemoglobin: 7.3 g/dL — ABNORMAL LOW (ref 13.0–17.0)
Immature Granulocytes: 2 %
Lymphocytes Relative: 8 %
Lymphs Abs: 0.7 10*3/uL (ref 0.7–4.0)
MCH: 30.9 pg (ref 26.0–34.0)
MCHC: 30.8 g/dL (ref 30.0–36.0)
MCV: 100.4 fL — ABNORMAL HIGH (ref 80.0–100.0)
Monocytes Absolute: 0.3 10*3/uL (ref 0.1–1.0)
Monocytes Relative: 4 %
Neutro Abs: 7.7 10*3/uL (ref 1.7–7.7)
Neutrophils Relative %: 86 %
Platelets: 81 10*3/uL — ABNORMAL LOW (ref 150–400)
RBC: 2.36 MIL/uL — ABNORMAL LOW (ref 4.22–5.81)
RDW: 17.2 % — ABNORMAL HIGH (ref 11.5–15.5)
WBC: 8.9 10*3/uL (ref 4.0–10.5)
nRBC: 2.1 % — ABNORMAL HIGH (ref 0.0–0.2)

## 2018-09-14 LAB — BASIC METABOLIC PANEL
Anion gap: 11 (ref 5–15)
Anion gap: 10 (ref 5–15)
Anion gap: 8 (ref 5–15)
BUN: 99 mg/dL — ABNORMAL HIGH (ref 8–23)
BUN: 112 mg/dL — ABNORMAL HIGH (ref 8–23)
BUN: 116 mg/dL — ABNORMAL HIGH (ref 8–23)
CO2: 18 mmol/L — ABNORMAL LOW (ref 22–32)
CO2: 19 mmol/L — ABNORMAL LOW (ref 22–32)
CO2: 19 mmol/L — ABNORMAL LOW (ref 22–32)
Calcium: 7.9 mg/dL — ABNORMAL LOW (ref 8.9–10.3)
Calcium: 7.7 mg/dL — ABNORMAL LOW (ref 8.9–10.3)
Calcium: 7.7 mg/dL — ABNORMAL LOW (ref 8.9–10.3)
Chloride: 106 mmol/L (ref 98–111)
Chloride: 107 mmol/L (ref 98–111)
Chloride: 108 mmol/L (ref 98–111)
Creatinine, Ser: 3.54 mg/dL — ABNORMAL HIGH (ref 0.61–1.24)
Creatinine, Ser: 3.69 mg/dL — ABNORMAL HIGH (ref 0.61–1.24)
Creatinine, Ser: 3.64 mg/dL — ABNORMAL HIGH (ref 0.61–1.24)
GFR calc Af Amer: 19 mL/min — ABNORMAL LOW (ref >60)
GFR calc Af Amer: 19 mL/min — ABNORMAL LOW (ref >60)
GFR calc Af Amer: 19 mL/min — ABNORMAL LOW (ref >60)
GFR calc non Af Amer: 17 mL/min — ABNORMAL LOW (ref >60)
GFR calc non Af Amer: 16 mL/min — ABNORMAL LOW (ref >60)
GFR calc non Af Amer: 16 mL/min — ABNORMAL LOW (ref >60)
Glucose, Bld: 187 mg/dL — ABNORMAL HIGH (ref 70–99)
Glucose, Bld: 238 mg/dL — ABNORMAL HIGH (ref 70–99)
Glucose, Bld: 239 mg/dL — ABNORMAL HIGH (ref 70–99)
Potassium: 5.7 mmol/L — ABNORMAL HIGH (ref 3.5–5.1)
Potassium: 5.5 mmol/L — ABNORMAL HIGH (ref 3.5–5.1)
Potassium: 5.3 mmol/L — ABNORMAL HIGH (ref 3.5–5.1)
Sodium: 135 mmol/L (ref 135–145)
Sodium: 136 mmol/L (ref 135–145)
Sodium: 135 mmol/L (ref 135–145)

## 2018-09-14 LAB — GLUCOSE, CAPILLARY
Glucose-Capillary: 161 mg/dL — ABNORMAL HIGH (ref 70–99)
Glucose-Capillary: 169 mg/dL — ABNORMAL HIGH (ref 70–99)
Glucose-Capillary: 203 mg/dL — ABNORMAL HIGH (ref 70–99)
Glucose-Capillary: 203 mg/dL — ABNORMAL HIGH (ref 70–99)
Glucose-Capillary: 212 mg/dL — ABNORMAL HIGH (ref 70–99)
Glucose-Capillary: 219 mg/dL — ABNORMAL HIGH (ref 70–99)
Glucose-Capillary: 234 mg/dL — ABNORMAL HIGH (ref 70–99)

## 2018-09-14 LAB — HEPARIN LEVEL (UNFRACTIONATED): Heparin Unfractionated: 1.2 IU/mL — ABNORMAL HIGH (ref 0.30–0.70)

## 2018-09-14 LAB — LEGIONELLA PNEUMOPHILA SEROGP 1 UR AG: L. pneumophila Serogp 1 Ur Ag: NEGATIVE

## 2018-09-14 LAB — CMV DNA, QUANTITATIVE, PCR
CMV DNA Quant: NEGATIVE IU/mL
Log10 CMV Qn DNA Pl: UNDETERMINED log10 IU/mL

## 2018-09-14 MED ORDER — NOREPINEPHRINE BITARTRATE 1 MG/ML IV SOLN
0.0000 ug/min | INTRAVENOUS | Status: DC
  Administered 2018-09-14: 15:00:00 2 ug/min via INTRAVENOUS
  Filled 2018-09-14 (×2): qty 4

## 2018-09-14 MED ORDER — SODIUM POLYSTYRENE SULFONATE 15 GM/60ML PO SUSP
30.0000 g | Freq: Once | ORAL | Status: AC
  Administered 2018-09-14: 30 g
  Filled 2018-09-14: qty 120

## 2018-09-14 MED ORDER — SODIUM CHLORIDE 0.9 % IV BOLUS
500.0000 mL | Freq: Once | INTRAVENOUS | Status: AC
  Administered 2018-09-14: 500 mL via INTRAVENOUS

## 2018-09-14 MED ORDER — SODIUM CHLORIDE 0.9 % IV SOLN
2.0000 g | INTRAVENOUS | Status: DC
  Administered 2018-09-15 – 2018-09-16 (×2): 2 g via INTRAVENOUS
  Filled 2018-09-14 (×2): qty 2

## 2018-09-14 MED ORDER — HEPARIN (PORCINE) 25000 UT/250ML-% IV SOLN
1500.0000 [IU]/h | INTRAVENOUS | Status: DC
  Administered 2018-09-14 (×2): 1500 [IU]/h via INTRAVENOUS
  Filled 2018-09-14: qty 250

## 2018-09-14 MED ORDER — DEXMEDETOMIDINE HCL IN NACL 200 MCG/50ML IV SOLN
0.4000 ug/kg/h | INTRAVENOUS | Status: DC
  Administered 2018-09-14 (×3): 0.5 ug/kg/h via INTRAVENOUS
  Administered 2018-09-14: 10:00:00 0.4 ug/kg/h via INTRAVENOUS
  Administered 2018-09-14 (×2): 0.8 ug/kg/h via INTRAVENOUS
  Administered 2018-09-15: 0.7 ug/kg/h via INTRAVENOUS
  Administered 2018-09-15: 15:00:00 0.6 ug/kg/h via INTRAVENOUS
  Administered 2018-09-15: 0.8 ug/kg/h via INTRAVENOUS
  Administered 2018-09-15: 0.6 ug/kg/h via INTRAVENOUS
  Administered 2018-09-15: 04:00:00 0.7 ug/kg/h via INTRAVENOUS
  Administered 2018-09-15: 18:00:00 0.4 ug/kg/h via INTRAVENOUS
  Administered 2018-09-15: 0.5 ug/kg/h via INTRAVENOUS
  Administered 2018-09-15: 10:00:00 0.4 ug/kg/h via INTRAVENOUS
  Administered 2018-09-16 (×2): 0.8 ug/kg/h via INTRAVENOUS
  Administered 2018-09-16: 0.6 ug/kg/h via INTRAVENOUS
  Administered 2018-09-16: 0.7 ug/kg/h via INTRAVENOUS
  Administered 2018-09-16 (×4): 0.8 ug/kg/h via INTRAVENOUS
  Administered 2018-09-16: 0.7 ug/kg/h via INTRAVENOUS
  Administered 2018-09-17 (×10): 0.8 ug/kg/h via INTRAVENOUS
  Filled 2018-09-14 (×8): qty 50
  Filled 2018-09-14: qty 100
  Filled 2018-09-14 (×7): qty 50
  Filled 2018-09-14 (×2): qty 100
  Filled 2018-09-14 (×5): qty 50
  Filled 2018-09-14: qty 100
  Filled 2018-09-14 (×2): qty 50
  Filled 2018-09-14: qty 100
  Filled 2018-09-14: qty 50

## 2018-09-14 MED ORDER — PRO-STAT SUGAR FREE PO LIQD
60.0000 mL | Freq: Three times a day (TID) | ORAL | Status: DC
  Administered 2018-09-14 – 2018-09-20 (×18): 60 mL
  Filled 2018-09-14 (×17): qty 60

## 2018-09-14 MED ORDER — SODIUM CHLORIDE 0.9 % IV SOLN
INTRAVENOUS | Status: DC
  Administered 2018-09-14 – 2018-09-16 (×6): via INTRAVENOUS

## 2018-09-14 MED ORDER — VITAL HIGH PROTEIN PO LIQD
1000.0000 mL | ORAL | Status: DC
  Administered 2018-09-14 – 2018-09-19 (×7): 1000 mL

## 2018-09-14 NOTE — Progress Notes (Signed)
Pharmacy and Dr. Ander Slade made aware of patient having a bloody nose and blood from his gums. Verbal order was given at bedside to stop the Heparin gtt. Oral care provided as well as cleaning patients nose

## 2018-09-14 NOTE — Progress Notes (Signed)
NAME:  [name] Rose, MRN:  [id number], DOB:  [birth year], LOS: 62 ADMISSION DATE:  09/08/2018, CONSULTATION DATE:  [id number] REFERRING MD:  Dr. Sloan Leiter, [facility], CHIEF COMPLAINT:  Short of breath   Brief History   68 yo male former smoker presented to ER with weakness, hypoxia, fever 101.5.  He has hx of metastatic bladder cancer on targeted therapy with Padcev x 3 cycles.  CXR showed pulmonary infiltrates.  COVID testing negative x 2.  He had progressive hypoxia and PCCM asked to assess.  Past Medical History  HTN, HLD, Prostate cancer, PAF, CKD 3, MSSA Bacteremia March 2020, chemotherapy induced neutropenia, moderate mitral regurgitation  Significant Hospital Events   5/29 Admit 5/31 solumedrol started, heparin gtt started, transfuse PRBC 6/01 add bactrim for possible PCP 6/02 VDRF 6/3-agitated off sedation  Consults:  Oncology 5/30 bladder cancer ID 5/31 atypical pneumonia  Procedures:  ETT 6/02 >>  Bronchoscopy 6/02>>  Significant Diagnostic Tests:  Dopper legs b/l 5/31 >> no DVT CT chest 5/31 >> atherosclerosis, extensive GGO with inter and intralobular thickening, fatty liver   Micro Data:  Blood 5/29 >> COVID 5/29 >> negative Urine 5/29 >> insignificant growth COVID 5/29 >> negative Influenza 5/30 >> negative  No organisms on BAL so far 6/03  Antimicrobials:  Vancomycin 5/28 >> Cefepime 5/28 >>  Bactrim 6/01 >>  Interim history/subjective:  On vent Agitated off sedation Sedation had to be increased this morning  Objective   Blood pressure (!) 132/59, pulse 99, temperature 97.7 F (36.5 C), temperature source Oral, resp. rate 19, height _0  (1.88 m), weight 115.5 kg, SpO2 94 %.    Vent Mode: PRVC FiO2 (%):  [60 %-70 %] 60 % Set Rate:  [30 bmp] 30 bmp Vt Set:  [570 mL] 570 mL PEEP:  [10 cmH20] 10 cmH20 Plateau Pressure:  [26 cmH20-32 cmH20] 29 cmH20   Intake/Output Summary (Last 24 hours) at 09/14/2018 4128 Last data filed at 09/14/2018 0825 Gross per 24  hour  Intake 2948.84 ml  Output 1100 ml  Net 1848.84 ml   Filed Weights   09/08/18 0545 09/13/18 0500 09/14/18 0500  Weight: 113.4 kg 115.7 kg 115.5 kg    Examination: Sedated, on vent Pupils reactive Endotracheal tube in place S1-S2 appreciated Decreased breath sounds bilaterally,Rhonchi Abdomen is soft, bowel sounds appreciated Extremities shows no clubbing no edema Skin is warm and dry  Chest x-ray 09/14/2018 shows bibasal haziness  Resolved Hospital Problem list     Assessment & Plan:   Acute hypoxemic respiratory failure with groundglass opacities Post bronchoscopy 09/12/2018 -Influenza negative, coronavirus negative, mycoplasma IgM negative -Continue current antibiotics -Weaning as tolerated -Still on 70% FiO2 with increased sedation needs -Not a candidate for extubation at present  Paroxysmal atrial fibrillation -On heparin  Anemia, thrombocytopenia Metastatic bladder cancer on targeted therapy -Trend hemoglobin -Oncology following  Chronic kidney disease with acute kidney injury -Trend electrolytes -BUN/creatinine increasing-we will attempt to fluid resuscitate -Monitor electrolytes closely  Hypoglycemia -SSI  Not ready for extubation is still on high FiO2 Daily weaning protocol Optimize sedation needs Remains very sick at present  Best practice:  Diet: Continue tube feeds DVT prophylaxis: On heparinGI prophylaxis: protonix Mobility: Bed rest Code Status: full code Disposition: ICU  Labs    CMP Latest Ref Rng & Units 09/14/2018 09/13/2018 09/12/2018  Glucose 70 - 99 mg/dL 187(H) 164(H) 166(H)  BUN 8 - 23 mg/dL 99(H) 71(H) 52(H)  Creatinine 0.61 - 1.24 mg/dL 3.54(H) 2.76(H) 1.83(H)  Sodium 135 -  145 mmol/L 135 133(L) 138  Potassium 3.5 - 5.1 mmol/L 5.7(H) 5.4(H) 5.3(H)  Chloride 98 - 111 mmol/L 106 107 108  CO2 22 - 32 mmol/L 18(L) 19(L) 20(L)  Calcium 8.9 - 10.3 mg/dL 7.9(L) 7.8(L) 8.3(L)  Total Protein 6.5 - 8.1 g/dL - 4.8(L) 5.1(L)  Total  Bilirubin 0.3 - 1.2 mg/dL - 0.3 0.2(L)  Alkaline Phos 38 - 126 U/L - 71 97  AST 15 - 41 U/L - 43(H) 50(H)  ALT 0 - 44 U/L - 32 34   CBC Latest Ref Rng & Units 09/14/2018 09/13/2018 09/12/2018  WBC 4.0 - 10.5 K/uL 8.9 16.3(H) 12.3(H)  Hemoglobin 13.0 - 17.0 g/dL 7.3(L) 7.8(L) 8.2(L)  Hematocrit 39.0 - 52.0 % 23.7(L) 24.6(L) 25.6(L)  Platelets 150 - 400 K/uL 81(L) 139(L) 117(L)   ABG    Component Value Date/Time   PHART 7.237 (L) 09/13/2018 1027   PCO2ART 45.5 09/13/2018 1027   PO2ART 65.7 (L) 09/13/2018 1027   HCO3 18.7 (L) 09/13/2018 1027   TCO2 29 09/20/2016 0657   ACIDBASEDEF 7.8 (H) 09/13/2018 1027   O2SAT 87.7 09/13/2018 1027   CBG (last 3)  Recent Labs    09/13/18 2313 09/14/18 0334 09/14/18 0749  GLUCAP 167* 169* 203*   The patient is critically ill with multiple organ system failure and requires high complexity decision making for assessment and support, frequent evaluation and titration of therapies, advanced monitoring, review of radiographic studies and interpretation of complex data.    Critical Care Time devoted to patient care services, exclusive of separately billable procedures, described in this note is 35 minutes.  Sherrilyn Rist, MD [place], PCCM Cell: [phone]

## 2018-09-14 NOTE — Progress Notes (Signed)
ANTICOAGULATION CONSULT NOTE - Follow Up Consult  Pharmacy Consult for Heparin Indication: presumed PE  No Known Allergies  Patient Measurements: Height: 6\' 2"  (188 cm) Weight: 255 lb 1.2 oz (115.7 kg) IBW/kg (Calculated) : 82.2 Heparin Dosing Weight:   Vital Signs: Temp: 98.3 F (36.8 C) (06/04 0344) Temp Source: Axillary (06/04 0344) BP: 129/59 (06/04 0430) Pulse Rate: 111 (06/04 0430)  Labs: Recent Labs    09/12/18 0439  09/12/18 2330 09/13/18 0500 09/13/18 0800 09/14/18 0425  HGB 8.2*  --   --  7.8*  --  7.3*  HCT 25.6*  --   --  24.6*  --  23.7*  PLT 117*  --   --  139*  --  81*  HEPARINUNFRC 0.71*   < > 0.33  --  0.66 1.20*  CREATININE 1.83*  --   --  2.76*  --  3.54*   < > = values in this interval not displayed.    Estimated Creatinine Clearance: 27.4 mL/min (A) (by C-G formula based on SCr of 3.54 mg/dL (H)).   Medications:  Infusions:  . azithromycin Stopped (09/13/18 1826)  . ceFEPime (MAXIPIME) IV Stopped (09/13/18 1750)  . feeding supplement (VITAL HIGH PROTEIN) 1,000 mL (09/13/18 0947)  . fentaNYL infusion INTRAVENOUS 250 mcg/hr (09/13/18 1800)  . heparin    . phenylephrine (NEO-SYNEPHRINE) Adult infusion Stopped (09/13/18 1747)    Assessment: Patient with high heparin level.  No heparin issues per RN.  Goal of Therapy:  Heparin level 0.3-0.7 units/ml Monitor platelets by anticoagulation protocol: Yes   Plan:  Hold heparin until 0600 At 0600, resume heparin drip at 1500 units/hr Recheck heparin level at [place], [place] 09/14/2018,5:19 AM

## 2018-09-14 NOTE — Progress Notes (Addendum)
Patient ID: [name] Rose, male   DOB: Jun 14, 1950, 68 y.o.   MRN: [id number]         [facility]  Date of Admission:  09/08/2018           Day 7 cefepime        Day 5 azithromycin         ASSESSMENT: The cause of his recent fever, pulmonary infiltrates and hypoxia remains unclear.  BAL cultures are growing Candida which is an insignificant colonizer.  He continues to be ventilator dependent and is now developing acute on chronic renal insufficiency.  PLAN: 1. Continue cefepime and azithromycin for now 2. 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 chlorhexidine gluconate (MEDLINE KIT)  15 mL Mouth Rinse BID  . Chlorhexidine Gluconate Cloth  6 each Topical Daily  . Chlorhexidine Gluconate Cloth  6 each Topical Daily  . diltiazem  30 mg Per Tube Q8H  . feeding supplement (PRO-STAT SUGAR FREE 64)  60 mL Per Tube TID  . feeding supplement (VITAL HIGH PROTEIN)  1,000 mL Per Tube Q24H  . gabapentin  300 mg Per Tube Q8H  . insulin aspart  3-9 Units Subcutaneous Q4H  . mouth rinse  15 mL Mouth Rinse 10 times per day  . methylPREDNISolone (SOLU-MEDROL) injection  80 mg Intravenous Q6H  . metoprolol tartrate  12.5 mg Per Tube BID  . pantoprazole sodium  40 mg Per Tube Q24H  . vitamin B-6  300 mg Per Tube Daily   Continuous Infusions: . sodium chloride 125 mL/hr at 09/14/18 1026  . azithromycin Stopped (09/13/18 1826)  . [START ON 09/15/2018] ceFEPime (MAXIPIME) IV    . dexmedetomidine (PRECEDEX) IV infusion 0.4 mcg/kg/hr (09/14/18 1026)  . fentaNYL infusion INTRAVENOUS 200 mcg/hr (09/14/18 1107)  . heparin 1,500 Units/hr (09/14/18 1111)  . phenylephrine  (NEO-SYNEPHRINE) Adult infusion Stopped (09/13/18 1747)   PRN Meds:.acetaminophen **OR** acetaminophen, albuterol, bisacodyl, fentaNYL, metoprolol tartrate, midazolam, [DISCONTINUED] ondansetron **OR** ondansetron (ZOFRAN) IV, sennosides, sodium chloride flush  Review of Systems: Review of Systems  Unable to perform ROS: Intubated    No Known Allergies  OBJECTIVE: Vitals:   09/14/18 0800 09/14/18 0900 09/14/18 1000 09/14/18 1106  BP: (!) 132/59 125/66 127/69 (!) 88/58  Pulse: 99 (!) 115 (!) 101 72  Resp: 19 (!) 30 (!) 9 (!) 30  Temp: 97.7 F (36.5 C)     TempSrc: Oral     SpO2: 94% 98% 100% 100%  Weight:      Height:       Body mass index is 32.69 kg/m.  Physical Exam Constitutional:      Comments: He remains intubated.  He is agitated.  Cardiovascular:     Rate and Rhythm: Regular rhythm.     Heart sounds: No murmur.     Comments: Tachycardia. Pulmonary:     Breath sounds: Normal breath sounds.     Lab Results Lab Results  Component Value Date   WBC 8.9 09/14/2018   HGB 7.3 (L) 09/14/2018   HCT 23.7 (L) 09/14/2018   MCV 100.4 (H) 09/14/2018   PLT 81 (L) 09/14/2018    Lab Results  Component Value Date  CREATININE 3.54 (H) 09/14/2018   BUN 99 (H) 09/14/2018   NA 135 09/14/2018   K 5.7 (H) 09/14/2018   CL 106 09/14/2018   CO2 18 (L) 09/14/2018    Lab Results  Component Value Date   ALT 32 09/13/2018   AST 43 (H) 09/13/2018   ALKPHOS 71 09/13/2018   BILITOT 0.3 09/13/2018     Microbiology: Recent Results (from the past 240 hour(s))  Blood Culture (routine x 2)     Status: None   Collection Time: 09/08/18  3:58 AM  Result Value Ref Range Status   Specimen Description   Final    BLOOD RIGHT PORTA CATH Performed at [facility] [address], [place], [place] [address]    Special Requests   Final    BOTTLES DRAWN AEROBIC AND ANAEROBIC Blood Culture adequate volume Performed at [facility] [address], [place], [place] [address]    Culture   Final    NO GROWTH 5 DAYS Performed at [facility], [place] [address], [place], [place] [address]    Report Status 09/13/2018 FINAL  Final  SARS Coronavirus 2 (CEPHEID - Performed in [place] hospital lab), Hosp Order     Status: None   Collection Time: 09/08/18  3:58 AM  Result Value Ref Range Status   SARS Coronavirus 2 NEGATIVE NEGATIVE Final    Comment: (NOTE) If result is NEGATIVE SARS-CoV-2 target nucleic acids are NOT DETECTED. The SARS-CoV-2 RNA is generally detectable in upper and lower  respiratory specimens during the acute phase of infection. The lowest  concentration of SARS-CoV-2 viral copies this assay can detect is 250  copies / mL. A negative result does not preclude SARS-CoV-2 infection  and should not be used as the sole basis for treatment or other  patient management decisions.  A negative result may occur with  improper specimen collection / handling, submission of specimen other  than nasopharyngeal swab, presence of viral mutation(s) within the  areas targeted by this assay, and inadequate number of viral copies  (<250 copies / mL). A negative result must be combined with clinical  observations, patient history, and epidemiological information. If result is POSITIVE SARS-CoV-2 target nucleic acids are DETECTED. The SARS-CoV-2 RNA is generally detectable in upper and lower  respiratory specimens dur ing the acute phase of infection.  Positive  results are indicative of active infection with SARS-CoV-2.  Clinical  correlation with patient history and other diagnostic information is  necessary to determine patient infection status.  Positive results do  not rule out bacterial infection or co-infection with other viruses. If result is PRESUMPTIVE POSTIVE SARS-CoV-2 nucleic acids MAY BE PRESENT.   A presumptive positive result was obtained on the submitted specimen  and confirmed on repeat testing.  While 2019  novel coronavirus  (SARS-CoV-2) nucleic acids may be present in the submitted sample  additional confirmatory testing may be necessary for epidemiological  and / or clinical management purposes  to differentiate between  SARS-CoV-2 and other Sarbecovirus currently known to infect humans.  If clinically indicated additional testing with an alternate test  methodology [phone]) is advised. The SARS-CoV-2 RNA is generally  detectable in upper and lower respiratory sp ecimens during the acute  phase of infection. The expected result is Negative. Fact Sheet for Patients:  StrictlyIdeas.no Fact Sheet for Healthcare Providers: BankingDealers.co.za This test is not yet approved or cleared by the [place] FDA and has been authorized for detection  and/or diagnosis of SARS-CoV-2 by FDA under an Emergency Use Authorization (EUA).  This EUA will remain in effect (meaning this test can be used) for the duration of the COVID-19 declaration under Section 564(b)(1) of the Act, 21 U.S.C. section 360bbb-3(b)(1), unless the authorization is terminated or revoked sooner. Performed at [facility], [place] [address] [place], [place] [address]   Blood Culture (routine x 2)     Status: None   Collection Time: 09/08/18  4:43 AM  Result Value Ref Range Status   Specimen Description   Final    BLOOD LEFT ANTECUBITAL Performed at [facility] [address], [place], [place] [address]    Special Requests   Final    BOTTLES DRAWN AEROBIC AND ANAEROBIC Blood Culture adequate volume Performed at [facility] [address], [place], [place] [address]    Culture   Final    NO GROWTH 5 DAYS Performed at [facility], [place] [address], [place], [place] [address]    Report Status 09/13/2018 FINAL  Final  Urine culture     Status: Abnormal   Collection Time: 09/08/18  8:25 AM  Result Value Ref Range  Status   Specimen Description   Final    URINE, RANDOM Performed at [facility] [address] [place], [place] [address]    Special Requests   Final    NONE Performed at [facility], [place] [address] [place], [place] [address]    Culture (A)  Final    <10,000 COLONIES/mL INSIGNIFICANT GROWTH Performed at [facility] [address], [place], [place] [address]    Report Status 09/09/2018 FINAL  Final  MRSA PCR Screening     Status: None   Collection Time: 09/08/18  8:52 AM  Result Value Ref Range Status   MRSA by PCR NEGATIVE NEGATIVE Final    Comment:        The GeneXpert MRSA Assay (FDA approved for NASAL specimens only), is one component of a comprehensive MRSA colonization surveillance program. It is not intended to diagnose MRSA infection nor to guide or monitor treatment for MRSA infections. Performed at [facility], [place] [address] [name],  [address]   Novel Coronavirus,NAA,(SEND-OUT TO REF LAB - TAT 24-48 hrs); Hosp Order     Status: None   Collection Time: 09/08/18 10:47 AM  Result Value Ref Range Status   SARS-CoV-2, NAA NOT DETECTED NOT DETECTED Final    Comment: (NOTE) This test was developed and its performance characteristics determined by [employer]. This test has not been FDA cleared or approved. This test has been authorized by FDA under an Emergency Use Authorization (EUA). This test is only authorized for the duration of time the declaration that circumstances exist justifying the authorization of the emergency use of in vitro diagnostic tests for detection of SARS-CoV-2 virus and/or diagnosis of COVID-19 infection under section 564(b)(1) of the Act, 21 U.S.C. 638LHT-3(S)(2), unless the authorization is terminated or revoked sooner. When diagnostic testing is negative, the possibility of a false negative result should be considered in the context of a patient's recent  exposures and the presence of clinical signs and symptoms consistent with COVID-19. An individual without symptoms of COVID-19 and who is not shedding SARS-CoV-2 virus would expect to have a negative (not detected) result in this assay. Performed  At: [facility] [place], [place] [id number] Rush Farmer MD IO:[id number]  Coronavirus Source NASOPHARYNGEAL  Final    Comment: Performed at [facility], [place] [address] [place], [place] [address]  Culture, bal-quantitative     Status: Abnormal (Preliminary result)   Collection Time: 09/12/18  2:30 PM  Result Value Ref Range Status   Specimen Description   Final    BRONCHIAL ALVEOLAR LAVAGE Performed at [facility] [address] [place], [place] [address]    Special Requests   Final    Normal Performed at [facility], [place] [address] [place], [place] [address]    Gram Stain   Final    FEW WBC PRESENT, PREDOMINANTLY PMN RARE SQUAMOUS EPITHELIAL CELLS PRESENT NO ORGANISMS SEEN    Culture (A)  Final    5,000 COLONIES/mL YEAST IDENTIFICATION TO FOLLOW Performed at [facility], [address] [place], [place] [address]    Report Status PENDING  Incomplete  Pneumocystis smear by DFA     Status: None   Collection Time: 09/12/18  2:30 PM  Result Value Ref Range Status   Specimen Source-PJSRC BRONCHIAL ALVEOLAR LAVAGE  Final   Pneumocystis jiroveci Ag NEGATIVE  Final    Comment: Performed at [facility] Performed at [facility], [place] [address], [place], [place] [address]   Fungus Culture With Stain     Status: Abnormal (Preliminary result)   Collection Time: 09/12/18  2:31 PM  Result Value Ref Range Status   Fungus Stain Final report (A)  Final    Comment: (NOTE) Performed At: [facility] [place], [place] [id number] Rush Farmer MD SM:[id number]    Fungus (Mycology) Culture PENDING   Incomplete   Fungal Source BRONCHIAL ALVEOLAR LAVAGE  Final    Comment: Performed at [facility], [place] [address], [place],  [address]  Fungus Culture Result     Status: Abnormal   Collection Time: 09/12/18  2:31 PM  Result Value Ref Range Status   Result 1 Yeast observed (A)  Final    Comment: (NOTE) Performed At: [facility] [address] [place], [place] [id number] Rush Farmer MD IT:[id number]     Michel Bickers, [place] for [place] [phone] pager   [phone] cell 09/14/2018, 11:23 AM

## 2018-09-14 NOTE — Progress Notes (Signed)
Nutrition Follow-up  DOCUMENTATION CODES:   Obesity unspecified  INTERVENTION:  - will adjust TF regimen: Vital High Protein @ 40 ml/hr with 60 ml prostat TID. - this regimen will provide 1560 kcal, 174 grams protein, and 802 ml free water.  - regimen provides 2944 mg of K and 1068 mg Phos.  - free water flush, if desired, to be per MD/NP.  - will not order multivitamin at this time d/t hyperkalemia and hyperphosphatemia.  - will monitor K and Phos closely and determine if adjustment in TF formula is needed.    NUTRITION DIAGNOSIS:   Inadequate oral intake related to inability to eat as evidenced by NPO status. -ongoing  GOAL:   Provide needs based on ASPEN/SCCM guidelines -met with TF regimen  MONITOR:   Vent status, Labs, Weight trends, TF tolerance, I & O's  ASSESSMENT:   68 y.o. male with medical history significant of hypertension, hyperlipidemia, prediabetes, prostate cancer, metastasized bladder cancer on chemotherapy, PAF, CKD stage III, MSSA bacteremia.  Patient presented secondary to significant weakness overnight. Admitted for pneumonia.  Weight slightly up since admission (+2.1 kg/5 lb). Patient remains intubated with OGT in place and he is receiving Vital High Protein @ 60 ml/hr with 30 ml prostat BID. This regimen provides 1640 kcal, 156 grams protein, and 1204 ml free water.   Per notes: acute hypoxemic respiratory failure with ground glass opacities s/p bronch on 6/2, not a candidate for extubation today, anemia, thrombocytopenia, AKI on CKD.   Patient is currently intubated on ventilator support MV: 16.8 L/min Temp (24hrs), Avg:98.1 F (36.7 C), Min:97.7 F (36.5 C), Max:98.5 F (36.9 C) BP: 145/114 and MAP: 124  Medications reviewed; sliding scale novolog, 80 mg solu-medrol QID, 300 mg vitamin B6 per OGT/day, 30 g kayexalate per OGT x1 dose 6.4. Labs reviewed; CBGs: 169 and 203 mg/dl today, K: 5.7 mmol/l, Phos: 5.9 mg/dl, BUN: 99 mmol/l, creatinine: 3.54  mg/dl, Ca: 7.9 mg/dl, GFR: 17. IVF; NS @ 125 ml/hr. Drips; fentanyl @ 300 mcg/hr, heparin @ 1500 units/hr.    NUTRITION - FOCUSED PHYSICAL EXAM:  completed; no muscle or fat wasting, mild edema to BUE, moderate edema to BLE  Diet Order:   Diet Order            Diet NPO time specified  Diet effective now              EDUCATION NEEDS:   Not appropriate for education at this time  Skin:  Skin Assessment: Reviewed RN Assessment  Last BM:  5/30  Height:   Ht Readings from Last 1 Encounters:  09/13/18 _0  (1.88 m)    Weight:   Wt Readings from Last 1 Encounters:  09/14/18 115.5 kg    Ideal Body Weight:  86.3 kg  BMI:  Body mass index is 32.69 kg/m.  Estimated Nutritional Needs:   Kcal:  3361-2244  Protein:  >/= 173 grams  Fluid:  >/= 1.8 L/day     [name] Matin, MS, RD, LDN, [facility] Inpatient Clinical Dietitian Pager # [phone] After hours/weekend pager # [phone]

## 2018-09-14 NOTE — Progress Notes (Signed)
It is very disappointing to see how things are going for Mr. Soules.  He is intubated.  His kidneys are giving out on him right now.  I am still not sure as to why he has developed this respiratory failure.  I cannot find anything about Padcev causing respiratory failure.  It can cause some dyspnea but this is in less than 5% of patients.  He is sedated.  He is not neutropenic at all.  His white cell count is 8.9.  Hemoglobin 7.3 and platelet count 81,000.  His BUN is 99 creatinine 3.54.  He still on a heparin infusion.  I would think that the chance of him having a pulmonary embolism would be on the low side but again I cannot argue with keeping him on heparin for right now.  He underwent a bronchoscopy.  I will think anything was seen in the endobronchial tree.  Cultures were taken from the fluid.  This is still pending.  On the cytology, fungal organisms were present.  I am not sure exactly what the significance of this it might be.  Again, he is not neutropenic.  He is on an agent that really does not affect his immune system.  I know that infectious diseases following closely.  He had a chest x-ray today.  It is pending.  I am just absolutely disappointed as to how things have gone for Mr. Oesterling.  I would have never imagine that he would have run into these problems.  I hope that his kidneys improve so that he will not need dialysis.  For right now, his cancer is doing quite well.  I had to say that he is in remission by his last PET scan so I would not think that this should be an issue for many months.  I very much appreciate the outstanding care that he is getting from all the staff in the ICU.  I know everybody is working really hard to try to help him out and to try to get him through this critical event.  [name], MD  [place]sw[place] Milian 41:10

## 2018-09-14 NOTE — Progress Notes (Signed)
Pharmacy Antibiotic Note  [name] Rose is a 68 y.o. male admitted on 09/08/2018 with pneumonia.  Pharmacy has been consulted for cefepime dosing. Patient has met bladder cancer on enfortumab.  Patient presents with hypoxia and fever. He was started on cefepime 5/29 with ID adding azithromycin 5/31. He was on septra 5/1-6/3 until PCP resulted negative. S/p bronch 6/2. Intubated 6/2   Today, 09/14/2018  D7 cefepime  D5 azith  SCr  Up to 3.54  WBC 16.3>8.9  afebrile  Plan: Decrease Cefepime to 2 gm IV q24  Height: 6' 2" (188 cm) Weight: 254 lb 10.1 oz (115.5 kg) IBW/kg (Calculated) : 82.2  Temp (24hrs), Avg:98.2 F (36.8 C), Min:98.1 F (36.7 C), Max:98.5 F (36.9 C)  Recent Labs  Lab 09/08/18 0358  09/10/18 0240 09/11/18 0301 09/12/18 0439 09/13/18 0500 09/14/18 0425  WBC  --    < > 7.0 6.0 12.3* 16.3* 8.9  CREATININE  --    < > 1.74* 1.78* 1.83* 2.76* 3.54*  LATICACIDVEN 1.1  --   --   --   --   --   --    < > = values in this interval not displayed.    Estimated Creatinine Clearance: 27.4 mL/min (A) (by C-G formula based on SCr of 3.54 mg/dL (H)).    No Known Allergies Antimicrobials this admission: 5/29 cefepime >>  5/29 vancomycin >> 5/29 5/31 Zithro per ID >>  6/1 TMP/SMZ >> 6/3  Dose adjustments this admission: 6/4 cefepime 2 gm q12>>q24 Microbiology results: 5/29 BCx: ngF 5/29 UCx: <10k insignificant growth 5/29 MRSA PCR: negative 5/29 SARS2: negative x2 5/30 Flu A/B neg 5/31 mycoplasma > neg 5/31 chlamydia> in process 6/2 LOK:PWXG 6/2 BAL fungal:sent 6/2 BAL AFB x 2:sent 6/2 PCP smear: negative 6/2 CMV: ip 6/2 legionella: ip  Thank you for allowing pharmacy to be a part of this patient's care.  Eudelia Bunch, Pharm.D 09/14/2018 8:15 AM

## 2018-09-14 NOTE — Progress Notes (Signed)
eLink Physician-Brief Progress Note Patient Name: [name] Rose DOB: 1950/06/14 MRN: [id number]   Date of Service  09/14/2018  HPI/Events of Note  Hyperkalemia = K+ = 5.7. EKG not widened or T peaked.   eICU Interventions  Will order: 1. Kayexalate 30 gm per tube now. 2. Repeat BMP at 12 noon.      Intervention Category Major Interventions: Electrolyte abnormality - evaluation and management  [name],[name] 09/14/2018, 6:57 AM

## 2018-09-15 ENCOUNTER — Inpatient Hospital Stay (HOSPITAL_COMMUNITY): Payer: PPO

## 2018-09-15 DIAGNOSIS — D696 Thrombocytopenia, unspecified: Secondary | ICD-10-CM

## 2018-09-15 LAB — CBC WITH DIFFERENTIAL/PLATELET
Abs Immature Granulocytes: 0.13 10*3/uL — ABNORMAL HIGH (ref 0.00–0.07)
Basophils Absolute: 0 10*3/uL (ref 0.0–0.1)
Basophils Relative: 0 %
Eosinophils Absolute: 0 10*3/uL (ref 0.0–0.5)
Eosinophils Relative: 0 %
HCT: 20.9 % — ABNORMAL LOW (ref 39.0–52.0)
Hemoglobin: 6.5 g/dL — CL (ref 13.0–17.0)
Immature Granulocytes: 2 %
Lymphocytes Relative: 10 %
Lymphs Abs: 0.6 10*3/uL — ABNORMAL LOW (ref 0.7–4.0)
MCH: 31.1 pg (ref 26.0–34.0)
MCHC: 31.1 g/dL (ref 30.0–36.0)
MCV: 100 fL (ref 80.0–100.0)
Monocytes Absolute: 0.2 10*3/uL (ref 0.1–1.0)
Monocytes Relative: 3 %
Neutro Abs: 5.1 10*3/uL (ref 1.7–7.7)
Neutrophils Relative %: 85 %
Platelets: 59 10*3/uL — ABNORMAL LOW (ref 150–400)
RBC: 2.09 MIL/uL — ABNORMAL LOW (ref 4.22–5.81)
RDW: 16.8 % — ABNORMAL HIGH (ref 11.5–15.5)
WBC: 6 10*3/uL (ref 4.0–10.5)
nRBC: 2.2 % — ABNORMAL HIGH (ref 0.0–0.2)

## 2018-09-15 LAB — CHLAMYDIA PANEL SERUM
C. Pneumoniae IgM Serum: <1:10 {titer}
C. Psittaci IgM Serum: <1:10 {titer}
C. Trachomatis IgM Serum: <1:10 {titer}

## 2018-09-15 LAB — HEMOGLOBIN AND HEMATOCRIT, BLOOD
HCT: 22.8 % — ABNORMAL LOW (ref 39.0–52.0)
Hemoglobin: 7.1 g/dL — ABNORMAL LOW (ref 13.0–17.0)

## 2018-09-15 LAB — BLOOD GAS, ARTERIAL
Acid-base deficit: 8.3 mmol/L — ABNORMAL HIGH (ref 0.0–2.0)
Bicarbonate: 17.9 mmol/L — ABNORMAL LOW (ref 20.0–28.0)
Drawn by: 225631
FIO2: 50
MECHVT: 0.57 mL
O2 Saturation: 96.4 %
PEEP: 10 cmH2O
Patient temperature: 38
RATE: 24 resp/min
pCO2 arterial: 44.7 mmHg (ref 32.0–48.0)
pH, Arterial: 7.233 — ABNORMAL LOW (ref 7.350–7.450)
pO2, Arterial: 100 mmHg (ref 83.0–108.0)

## 2018-09-15 LAB — BASIC METABOLIC PANEL
Anion gap: 8 (ref 5–15)
Anion gap: 10 (ref 5–15)
BUN: 95 mg/dL — ABNORMAL HIGH (ref 8–23)
BUN: 142 mg/dL — ABNORMAL HIGH (ref 8–23)
CO2: 18 mmol/L — ABNORMAL LOW (ref 22–32)
CO2: 16 mmol/L — ABNORMAL LOW (ref 22–32)
Calcium: 7.9 mg/dL — ABNORMAL LOW (ref 8.9–10.3)
Calcium: 7.9 mg/dL — ABNORMAL LOW (ref 8.9–10.3)
Chloride: 110 mmol/L (ref 98–111)
Chloride: 110 mmol/L (ref 98–111)
Creatinine, Ser: 3.76 mg/dL — ABNORMAL HIGH (ref 0.61–1.24)
Creatinine, Ser: 3.79 mg/dL — ABNORMAL HIGH (ref 0.61–1.24)
GFR calc Af Amer: 18 mL/min — ABNORMAL LOW (ref >60)
GFR calc Af Amer: 18 mL/min — ABNORMAL LOW (ref >60)
GFR calc non Af Amer: 16 mL/min — ABNORMAL LOW (ref >60)
GFR calc non Af Amer: 15 mL/min — ABNORMAL LOW (ref >60)
Glucose, Bld: 182 mg/dL — ABNORMAL HIGH (ref 70–99)
Glucose, Bld: 207 mg/dL — ABNORMAL HIGH (ref 70–99)
Potassium: 5.3 mmol/L — ABNORMAL HIGH (ref 3.5–5.1)
Potassium: 5.1 mmol/L (ref 3.5–5.1)
Sodium: 136 mmol/L (ref 135–145)
Sodium: 136 mmol/L (ref 135–145)

## 2018-09-15 LAB — CULTURE, BAL-QUANTITATIVE W GRAM STAIN
Culture: 5000 — AB
Special Requests: NORMAL

## 2018-09-15 LAB — PREPARE RBC (CROSSMATCH)

## 2018-09-15 LAB — GLUCOSE, CAPILLARY
Glucose-Capillary: 175 mg/dL — ABNORMAL HIGH (ref 70–99)
Glucose-Capillary: 192 mg/dL — ABNORMAL HIGH (ref 70–99)
Glucose-Capillary: 196 mg/dL — ABNORMAL HIGH (ref 70–99)
Glucose-Capillary: 172 mg/dL — ABNORMAL HIGH (ref 70–99)
Glucose-Capillary: 198 mg/dL — ABNORMAL HIGH (ref 70–99)
Glucose-Capillary: 237 mg/dL — ABNORMAL HIGH (ref 70–99)

## 2018-09-15 MED ORDER — METHYLPREDNISOLONE SODIUM SUCC 125 MG IJ SOLR
60.0000 mg | Freq: Three times a day (TID) | INTRAMUSCULAR | Status: DC
  Administered 2018-09-15 – 2018-09-16 (×2): 60 mg via INTRAVENOUS
  Filled 2018-09-15 (×2): qty 2

## 2018-09-15 MED ORDER — SODIUM CHLORIDE 0.9% IV SOLUTION
Freq: Once | INTRAVENOUS | Status: AC
  Administered 2018-09-15: 08:00:00 via INTRAVENOUS

## 2018-09-15 NOTE — Progress Notes (Signed)
Mr. Quillin is still on the ventilator.  He is sedated.  The heparin has been stopped.  He apparently had some bleeding.  His hemoglobin went down to 6.5 this morning.  He will get 1 unit of blood.  His platelet count also went down to 59,000.   it is hard to say if there is a HITT issue here.  I probably would not do a work-up for this right now.  I really do not think that he has a thromboembolic issue.  I probably would just stop the heparin.  It looks like his renal function is stabilizing.  His creatinine was 3.76 today.  The Candida in his BAL is not considered to be a pathogen.  No other cultures have come back positive.  His chest x-ray today is pending.  The x-ray yesterday was stable with the infiltrates.  He has been afebrile.  His blood pressure has varied.  His oxygen saturation is holding steady.  It is still not clear as to what has triggered all of this.  I cannot find anything in the pharmacology literature regarding Padcev causing anything like this.  Our pharmacists are looking into this also.  Hopefully, things will begin to improve and his renal function will improve.  I know that he is getting incredible care from everybody in the ICU.  [name], MD  [address] [place] 5:2

## 2018-09-15 NOTE — Progress Notes (Signed)
NAME:  [name] Rose, MRN:  [id number], DOB:  08/10/1950, LOS: 7 ADMISSION DATE:  09/08/2018, CONSULTATION DATE:  [id number] REFERRING MD:  Dr. Sloan Leiter, [facility], CHIEF COMPLAINT:  Short of breath   Brief History   68 yo male former smoker presented to ER with weakness, hypoxia, fever 101.5.  He has hx of metastatic bladder cancer on targeted therapy with Padcev x 3 cycles.  CXR showed pulmonary infiltrates.  COVID testing negative x 2.  He had progressive hypoxia and PCCM asked to assess. Still very sick, multiple comorbidities  Past Medical History  HTN, HLD, Prostate cancer, PAF, CKD 3, MSSA Bacteremia March 2020, chemotherapy induced neutropenia, moderate mitral regurgitation  Significant Hospital Events   5/29 Admit 5/31 solumedrol started, heparin gtt started, transfuse PRBC 6/01 add bactrim for possible PCP 6/02 VDRF 6/3-agitated off sedation  Consults:  Oncology 5/30 bladder cancer ID 5/31 atypical pneumonia  Procedures:  ETT 6/02 >>  Bronchoscopy 6/02>>  Significant Diagnostic Tests:  Dopper legs b/l 5/31 >> no DVT CT chest 5/31 >> atherosclerosis, extensive GGO with inter and intralobular thickening, fatty liver   Micro Data:  Blood 5/29 >> COVID 5/29 >> negative Urine 5/29 >> insignificant growth COVID 5/29 >> negative Influenza 5/30 >> negative  No organisms on BAL so far 6/03-positive for Candida  Antimicrobials:  Vancomycin 5/28 >> Cefepime 5/28 >>  Bactrim 6/01 >>  Interim history/subjective:  On vent Agitated off sedation  Objective   Blood pressure 122/65, pulse 66, temperature 97.7 F (36.5 C), temperature source Axillary, resp. rate (!) 28, height _0  (1.88 m), weight 120.5 kg, SpO2 98 %.    Vent Mode: PRVC FiO2 (%):  [40 %-60 %] 50 % Set Rate:  [30 bmp] 30 bmp Vt Set:  [570 mL] 570 mL PEEP:  [10 cmH20] 10 cmH20 Plateau Pressure:  [28 cmH20-32 cmH20] 29 cmH20   Intake/Output Summary (Last 24 hours) at 09/15/2018 1804 Last data filed at  09/15/2018 1700 Gross per 24 hour  Intake 5139.5 ml  Output 1265 ml  Net 3874.5 ml   Filed Weights   09/13/18 0500 09/14/18 0500 09/15/18 0412  Weight: 115.7 kg 115.5 kg 120.5 kg    Examination: On vent Pupils reactive Tracheal tube in place S1-S2 appreciated Decreased breath sounds bilaterally, bilateral rhonchi Abdomen is soft, bowel sounds appreciated Extremities shows no clubbing no edema Skin is warm and dry  Bibasal haziness on chest x-ray   Resolved Hospital Problem list     Assessment & Plan:   Acute hypoxemic respiratory failure groundglass opacities Post bronchoscopy 09/12/2018-significant for Candida-not been on antibiotics -Continue antibiotics -Weaning as tolerated -Still on high FiO2 -Not an extubation candidate at present   Paroxysmal atrial fibrillation -Heparin was discontinued secondary to bleeding -Did require transfusion  Anemia secondary to bleed  Anemia, thrombocytopenia Metastatic bladder cancer on targeted therapy -Trend hemoglobin -Oncology following  Chronic kidney disease with acute kidney injury -Trend electrolytes -BUN/creatinine increasing -Monitor electrolytes closely  -Not ready for extubation, still on high FiO2 -Delirium protocol -Optimize sedation -Transfuse as needed   Best practice:  Diet: Continue tube feeds DVT prophylaxis: On heparinGI prophylaxis: protonix Mobility: Bed rest Code Status: full code Disposition: ICU  Labs    CMP Latest Ref Rng & Units 09/15/2018 09/14/2018 09/14/2018  Glucose 70 - 99 mg/dL 182(H) 239(H) 238(H)  BUN 8 - 23 mg/dL 95(H) 116(H) 112(H)  Creatinine 0.61 - 1.24 mg/dL 3.76(H) 3.64(H) 3.69(H)  Sodium 135 - 145 mmol/L 136 135 136  Potassium  3.5 - 5.1 mmol/L 5.3(H) 5.3(H) 5.5(H)  Chloride 98 - 111 mmol/L 110 108 107  CO2 22 - 32 mmol/L 18(L) 19(L) 19(L)  Calcium 8.9 - 10.3 mg/dL 7.9(L) 7.7(L) 7.7(L)  Total Protein 6.5 - 8.1 g/dL - - -  Total Bilirubin 0.3 - 1.2 mg/dL - - -  Alkaline Phos  38 - 126 U/L - - -  AST 15 - 41 U/L - - -  ALT 0 - 44 U/L - - -   CBC Latest Ref Rng & Units 09/15/2018 09/15/2018 09/14/2018  WBC 4.0 - 10.5 K/uL - 6.0 8.9  Hemoglobin 13.0 - 17.0 g/dL 7.1(L) 6.5(LL) 7.3(L)  Hematocrit 39.0 - 52.0 % 22.8(L) 20.9(L) 23.7(L)  Platelets 150 - 400 K/uL - 59(L) 81(L)   ABG    Component Value Date/Time   PHART 7.233 (L) 09/15/2018 0400   PCO2ART 44.7 09/15/2018 0400   PO2ART 100 09/15/2018 0400   HCO3 17.9 (L) 09/15/2018 0400   TCO2 29 09/20/2016 0657   ACIDBASEDEF 8.3 (H) 09/15/2018 0400   O2SAT 96.4 09/15/2018 0400   CBG (last 3)  Recent Labs    09/15/18 0754 09/15/18 1117 09/15/18 1554  GLUCAP 192* 196* 172*   The patient is critically ill with multiple organ system failure and requires high complexity decision making for assessment and support, frequent evaluation and titration of therapies, advanced monitoring, review of radiographic studies and interpretation of complex data.    Critical Care Time devoted to patient care services, exclusive of separately billable procedures, described in this note is 30 minutes.  Sherrilyn Rist, MD [place], PCCM Cell: [phone]

## 2018-09-15 NOTE — Progress Notes (Signed)
Luster Hechler (Spouse) was called on 09/15/2018 at 0540 for verbal consent for the transfusion of blood. Consent has been verified by Tonette Lederer, RN and Mariann Laster, RN that it is OK to transfuse blood.

## 2018-09-15 NOTE — Progress Notes (Signed)
Patient ID: [name] Rose, male   DOB: 01-01-1951, 68 y.o.   MRN: [id number]         [facility]  Date of Admission:  09/08/2018           Day 8 cefepime        Day 6 azithromycin         ASSESSMENT: The cause of his recent fever, pulmonary infiltrates and hypoxia remains unclear.  BAL cultures are growing Candida which is an insignificant colonizer.  He has had some further worsening of his renal function.  I doubt that he has bacterial pneumonia.  I favor stopping empiric antibiotics now.  PLAN: 1. Discontinue cefepime and azithromycin  2. Please call my partner, Dr. Merrilee Seashore Powers [phone]), for any infectious disease questions this weekend  Principal Problem:   Pneumonia Active Problems:   AKI (acute kidney injury) ([place])   Acute respiratory failure with hypoxia ([place])   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 ([facility])   LFT elevation   CKD (chronic kidney disease), stage III ([facility])   Scheduled Meds: . chlorhexidine gluconate (MEDLINE KIT)  15 mL Mouth Rinse BID  . Chlorhexidine Gluconate Cloth  6 each Topical Daily  . Chlorhexidine Gluconate Cloth  6 each Topical Daily  . diltiazem  30 mg Per Tube Q8H  . feeding supplement (PRO-STAT SUGAR FREE 64)  60 mL Per Tube TID  . feeding supplement (VITAL HIGH PROTEIN)  1,000 mL Per Tube Q24H  . gabapentin  300 mg Per Tube Q8H  . insulin aspart  3-9 Units Subcutaneous Q4H  . mouth rinse  15 mL Mouth Rinse 10 times per day  . methylPREDNISolone (SOLU-MEDROL) injection  80 mg Intravenous Q6H  . metoprolol tartrate  12.5 mg Per Tube BID  . pantoprazole sodium  40 mg Per Tube Q24H  . vitamin B-6  300 mg Per Tube Daily   Continuous Infusions: . sodium chloride 125 mL/hr at 09/15/18 1041  . azithromycin Stopped (09/14/18 1828)  . ceFEPime (MAXIPIME) IV Stopped (09/15/18 0630)  . dexmedetomidine (PRECEDEX) IV infusion 0.4 mcg/kg/hr (09/15/18 0951)  . fentaNYL infusion INTRAVENOUS 200  mcg/hr (09/15/18 7096)  . norepinephrine (LEVOPHED) Adult infusion Stopped (09/15/18 0531)   PRN Meds:.acetaminophen **OR** acetaminophen, albuterol, bisacodyl, fentaNYL, metoprolol tartrate, midazolam, [DISCONTINUED] ondansetron **OR** ondansetron (ZOFRAN) IV, sennosides, sodium chloride flush  Review of Systems: Review of Systems  Unable to perform ROS: Intubated    No Known Allergies  OBJECTIVE: Vitals:   09/15/18 0815 09/15/18 0830 09/15/18 0900 09/15/18 0930  BP: (!) 106/55 110/63 108/66 111/65  Pulse: 71 73    Resp: (!) 30 (!) 30 (!) 30 (!) 30  Temp:  (!) 97.4 F (36.3 C) (!) 97.5 F (36.4 C)   TempSrc:  Oral Oral   SpO2: 100% 92%    Weight:      Height:       Body mass index is 34.11 kg/m.  Physical Exam Constitutional:      Comments: He remains intubated and agitated.  Cardiovascular:     Rate and Rhythm: Regular rhythm.     Heart sounds: No murmur.     Comments: Tachycardia. Pulmonary:     Breath sounds: Normal breath sounds.     Lab Results Lab Results  Component Value Date   WBC 6.0 09/15/2018   HGB 6.5 (LL) 09/15/2018   HCT 20.9 (L) 09/15/2018   MCV 100.0 09/15/2018   PLT 59 (L) 09/15/2018  Lab Results  Component Value Date   CREATININE 3.76 (H) 09/15/2018   BUN 95 (H) 09/15/2018   NA 136 09/15/2018   K 5.3 (H) 09/15/2018   CL 110 09/15/2018   CO2 18 (L) 09/15/2018    Lab Results  Component Value Date   ALT 32 09/13/2018   AST 43 (H) 09/13/2018   ALKPHOS 71 09/13/2018   BILITOT 0.3 09/13/2018     Microbiology: Recent Results (from the past 240 hour(s))  Blood Culture (routine x 2)     Status: None   Collection Time: 09/08/18  3:58 AM  Result Value Ref Range Status   Specimen Description   Final    BLOOD RIGHT PORTA CATH Performed at [facility] [address], [place], [place] [address]    Special Requests   Final    BOTTLES DRAWN AEROBIC AND ANAEROBIC Blood Culture adequate volume Performed at [facility] [address], [place], [place] [address]    Culture   Final    NO GROWTH 5 DAYS Performed at [facility], [place] [address] [place], [place] [address]    Report Status 09/13/2018 FINAL  Final  SARS Coronavirus 2 (CEPHEID - Performed in [facility] hospital lab), Hosp Order     Status: None   Collection Time: 09/08/18  3:58 AM  Result Value Ref Range Status   SARS Coronavirus 2 NEGATIVE NEGATIVE Final    Comment: (NOTE) If result is NEGATIVE SARS-CoV-2 target nucleic acids are NOT DETECTED. The SARS-CoV-2 RNA is generally detectable in upper and lower  respiratory specimens during the acute phase of infection. The lowest  concentration of SARS-CoV-2 viral copies this assay can detect is 250  copies / mL. A negative result does not preclude SARS-CoV-2 infection  and should not be used as the sole basis for treatment or other  patient management decisions.  A negative result may occur with  improper specimen collection / handling, submission of specimen other  than nasopharyngeal swab, presence of viral mutation(s) within the  areas targeted by this assay, and inadequate number of viral copies  (<250 copies / mL). A negative result must be combined with clinical  observations, patient history, and epidemiological information. If result is POSITIVE SARS-CoV-2 target nucleic acids are DETECTED. The SARS-CoV-2 RNA is generally detectable in upper and lower  respiratory specimens dur ing the acute phase of infection.  Positive  results are indicative of active infection with SARS-CoV-2.  Clinical  correlation with patient history and other diagnostic information is  necessary to determine patient infection status.  Positive results do  not rule out bacterial infection or co-infection with other viruses. If result is PRESUMPTIVE POSTIVE SARS-CoV-2 nucleic acids MAY BE PRESENT.   A presumptive positive result was obtained on the submitted specimen  and  confirmed on repeat testing.  While 2019 novel coronavirus  (SARS-CoV-2) nucleic acids may be present in the submitted sample  additional confirmatory testing may be necessary for epidemiological  and / or clinical management purposes  to differentiate between  SARS-CoV-2 and other Sarbecovirus currently known to infect humans.  If clinically indicated additional testing with an alternate test  methodology [phone]) is advised. The SARS-CoV-2 RNA is generally  detectable in upper and lower respiratory sp ecimens during the acute  phase of infection. The expected result is Negative. Fact Sheet for Patients:  StrictlyIdeas.no Fact Sheet for Healthcare Providers: BankingDealers.co.za This test is not yet approved or cleared by the [place]  States FDA and has been authorized for detection and/or diagnosis of SARS-CoV-2 by FDA under an Emergency Use Authorization (EUA).  This EUA will remain in effect (meaning this test can be used) for the duration of the COVID-19 declaration under Section 564(b)(1) of the Act, 21 U.S.C. section 360bbb-3(b)(1), unless the authorization is terminated or revoked sooner. Performed at [facility], [place] [address], [place], [place] [address]   Blood Culture (routine x 2)     Status: None   Collection Time: 09/08/18  4:43 AM  Result Value Ref Range Status   Specimen Description   Final    BLOOD LEFT ANTECUBITAL Performed at [facility] [address], [place], [place] [address]    Special Requests   Final    BOTTLES DRAWN AEROBIC AND ANAEROBIC Blood Culture adequate volume Performed at [facility] [address], [place], [place] [address]    Culture   Final    NO GROWTH 5 DAYS Performed at [facility], [place] [address] [place], [place] [address]    Report Status 09/13/2018 FINAL  Final  Urine culture     Status: Abnormal   Collection Time:  09/08/18  8:25 AM  Result Value Ref Range Status   Specimen Description   Final    URINE, RANDOM Performed at [facility] [address], [place], [place] [address]    Special Requests   Final    NONE Performed at [facility], [place] [address] [place], [place] [address]    Culture (A)  Final    <10,000 COLONIES/mL INSIGNIFICANT GROWTH Performed at [facility] [address], [place], [place] [address]    Report Status 09/09/2018 FINAL  Final  MRSA PCR Screening     Status: None   Collection Time: 09/08/18  8:52 AM  Result Value Ref Range Status   MRSA by PCR NEGATIVE NEGATIVE Final    Comment:        The GeneXpert MRSA Assay (FDA approved for NASAL specimens only), is one component of a comprehensive MRSA colonization surveillance program. It is not intended to diagnose MRSA infection nor to guide or monitor treatment for MRSA infections. Performed at [facility], [place] [address] [place], [place] [address]   Novel Coronavirus,NAA,(SEND-OUT TO REF LAB - TAT 24-48 hrs); Hosp Order     Status: None   Collection Time: 09/08/18 10:47 AM  Result Value Ref Range Status   SARS-CoV-2, NAA NOT DETECTED NOT DETECTED Final    Comment: (NOTE) This test was developed and its performance characteristics determined by [employer]. This test has not been FDA cleared or approved. This test has been authorized by FDA under an Emergency Use Authorization (EUA). This test is only authorized for the duration of time the declaration that circumstances exist justifying the authorization of the emergency use of in vitro diagnostic tests for detection of SARS-CoV-2 virus and/or diagnosis of COVID-19 infection under section 564(b)(1) of the Act, 21 U.S.C. 588FOY-7(X)(4), unless the authorization is terminated or revoked sooner. When diagnostic testing is negative, the possibility of a false negative result should be  considered in the context of a patient's recent exposures and the presence of clinical signs and symptoms consistent with COVID-19. An individual without symptoms of COVID-19 and who is not shedding SARS-CoV-2 virus would expect to have a negative (not detected) result in this assay. Performed  At: [facility] [place],  [place] [id number] Rush Farmer MD FV:[id number]    Coronavirus Source NASOPHARYNGEAL  Final    Comment: Performed at [facility], [place] [address] [place] [address], [place], [place] [address]  Culture, bal-quantitative     Status: Abnormal (Preliminary result)   Collection Time: 09/12/18  2:30 PM  Result Value Ref Range Status   Specimen Description   Final    BRONCHIAL ALVEOLAR LAVAGE Performed at [facility] [address] [place], [place] [address]    Special Requests   Final    Normal Performed at [facility], [place] [address], [place], [place] [address]    Gram Stain   Final    FEW WBC PRESENT, PREDOMINANTLY PMN RARE SQUAMOUS EPITHELIAL CELLS PRESENT NO ORGANISMS SEEN    Culture (A)  Final    5,000 COLONIES/mL YEAST IDENTIFICATION TO FOLLOW Performed at [facility], [address], [place], [place] [address]    Report Status PENDING  Incomplete  Pneumocystis smear by DFA     Status: None   Collection Time: 09/12/18  2:30 PM  Result Value Ref Range Status   Specimen Source-PJSRC BRONCHIAL ALVEOLAR LAVAGE  Final   Pneumocystis jiroveci Ag NEGATIVE  Final    Comment: Performed at [facility] Performed at [facility], [place] [address], [place], [place] [address]   Fungus Culture With Stain     Status: Abnormal (Preliminary result)   Collection Time: 09/12/18  2:31 PM  Result Value Ref Range Status   Fungus Stain Final report (A)  Final    Comment: (NOTE) Performed At: [facility] [place], [place] [id number] Rush Farmer MD  GZ:[id number]    Fungus (Mycology) Culture PENDING  Incomplete   Fungal Source BRONCHIAL ALVEOLAR LAVAGE  Final    Comment: Performed at [facility], [place] [address], [place], [place] [address]  Fungus Culture Result     Status: Abnormal   Collection Time: 09/12/18  2:31 PM  Result Value Ref Range Status   Result 1 Yeast observed (A)  Final    Comment: (NOTE) Performed At: [facility] [address] [place], [place] [id number] Rush Farmer MD [id number]     Michel Bickers, [place] for [place] [phone] pager   [phone] cell 09/15/2018, 11:08 AM

## 2018-09-15 NOTE — Progress Notes (Signed)
CRITICAL VALUE ALERT  Critical Value:  HGB 6.5  Date & Time Notied:  6/5 0450  Provider Notified: E-link  Orders Received/Actions taken: Awaiting new orders

## 2018-09-15 NOTE — Progress Notes (Signed)
eLink Physician-Brief Progress Note Patient Name: [name] Rose DOB: 1951/04/06 MRN: [id number]   Date of Service  09/15/2018  HPI/Events of Note  Hgb drop from 7.3 to 6.5.  Heparin had been held yesterday due to bloody nose and bleeding gums  eICU Interventions  Transfuse 1 unit pRBC Post-transfusion CBC     Intervention Category Major Interventions: Other:  Xochilt Conant 09/15/2018, 5:07 AM

## 2018-09-16 LAB — GLUCOSE, CAPILLARY
Glucose-Capillary: 265 mg/dL — ABNORMAL HIGH (ref 70–99)
Glucose-Capillary: 207 mg/dL — ABNORMAL HIGH (ref 70–99)
Glucose-Capillary: 207 mg/dL — ABNORMAL HIGH (ref 70–99)
Glucose-Capillary: 202 mg/dL — ABNORMAL HIGH (ref 70–99)
Glucose-Capillary: 168 mg/dL — ABNORMAL HIGH (ref 70–99)
Glucose-Capillary: 175 mg/dL — ABNORMAL HIGH (ref 70–99)
Glucose-Capillary: 172 mg/dL — ABNORMAL HIGH (ref 70–99)
Glucose-Capillary: 160 mg/dL — ABNORMAL HIGH (ref 70–99)
Glucose-Capillary: 148 mg/dL — ABNORMAL HIGH (ref 70–99)
Glucose-Capillary: 151 mg/dL — ABNORMAL HIGH (ref 70–99)
Glucose-Capillary: 142 mg/dL — ABNORMAL HIGH (ref 70–99)
Glucose-Capillary: 135 mg/dL — ABNORMAL HIGH (ref 70–99)
Glucose-Capillary: 147 mg/dL — ABNORMAL HIGH (ref 70–99)
Glucose-Capillary: 142 mg/dL — ABNORMAL HIGH (ref 70–99)
Glucose-Capillary: 158 mg/dL — ABNORMAL HIGH (ref 70–99)
Glucose-Capillary: 161 mg/dL — ABNORMAL HIGH (ref 70–99)
Glucose-Capillary: 170 mg/dL — ABNORMAL HIGH (ref 70–99)
Glucose-Capillary: 178 mg/dL — ABNORMAL HIGH (ref 70–99)

## 2018-09-16 LAB — BPAM RBC
Blood Product Expiration Date: 202006182359
ISSUE DATE / TIME: 202006050836
Unit Type and Rh: 6200

## 2018-09-16 LAB — BASIC METABOLIC PANEL
Anion gap: 8 (ref 5–15)
BUN: 155 mg/dL — ABNORMAL HIGH (ref 8–23)
CO2: 17 mmol/L — ABNORMAL LOW (ref 22–32)
Calcium: 8.2 mg/dL — ABNORMAL LOW (ref 8.9–10.3)
Chloride: 111 mmol/L (ref 98–111)
Creatinine, Ser: 3.84 mg/dL — ABNORMAL HIGH (ref 0.61–1.24)
GFR calc Af Amer: 18 mL/min — ABNORMAL LOW (ref >60)
GFR calc non Af Amer: 15 mL/min — ABNORMAL LOW (ref >60)
Glucose, Bld: 248 mg/dL — ABNORMAL HIGH (ref 70–99)
Potassium: 5.3 mmol/L — ABNORMAL HIGH (ref 3.5–5.1)
Sodium: 136 mmol/L (ref 135–145)

## 2018-09-16 LAB — TYPE AND SCREEN
ABO/RH(D): A POS
Antibody Screen: NEGATIVE
Unit division: 0

## 2018-09-16 MED ORDER — METHYLPREDNISOLONE SODIUM SUCC 40 MG IJ SOLR
40.0000 mg | Freq: Two times a day (BID) | INTRAMUSCULAR | Status: DC
  Administered 2018-09-16 – 2018-09-22 (×12): 40 mg via INTRAVENOUS
  Filled 2018-09-16 (×12): qty 1

## 2018-09-16 MED ORDER — INSULIN REGULAR(HUMAN) IN NACL 100-0.9 UT/100ML-% IV SOLN
INTRAVENOUS | Status: DC
  Administered 2018-09-16: 05:00:00 2.1 [IU]/h via INTRAVENOUS
  Administered 2018-09-17: 2.7 [IU]/h via INTRAVENOUS
  Filled 2018-09-16 (×2): qty 100

## 2018-09-16 NOTE — Progress Notes (Signed)
Spoke with pt's wife over the phone, updated about current status, and discussed treatment plan.  [name], MD [facility] Pulmonary/Critical Care 09/16/2018, 12:40 PM

## 2018-09-16 NOTE — Progress Notes (Signed)
NAME:  [name] Rose, MRN:  [id number], DOB:  12-10-1950, LOS: 34 ADMISSION DATE:  09/08/2018, CONSULTATION DATE:  [id number] REFERRING MD:  Dr. Sloan Leiter, [facility], CHIEF COMPLAINT:  Short of breath   Brief History   68 yo male former smoker presented to ER with weakness, hypoxia, fever 101.5.  He has hx of metastatic bladder cancer on targeted therapy with Padcev x 3 cycles.  CXR showed pulmonary infiltrates.  COVID testing negative x 2.  He had progressive hypoxia and PCCM asked to assess.  Past Medical History  HTN, HLD, Prostate cancer, PAF, CKD 3, MSSA Bacteremia March 2020, chemotherapy induced neutropenia, moderate mitral regurgitation  Significant Hospital Events   5/29 Admit 5/31 solumedrol started, heparin gtt started, transfuse PRBC 6/01 add bactrim for possible PCP 6/02 VDRF 6/04 d/c heparin gtt due to epistaxis 6/05 d/c ABx, transfuse PRBC 6/06 start to wean steroids  Consults:  Oncology 5/30 bladder cancer ID 5/31 atypical pneumonia  Procedures:  ETT 6/02 >>  Bronchoscopy 6/02 >> fungal elements on cytology, WBC 220 (76% N, 18% L, 6% M)  Significant Diagnostic Tests:  Dopper legs b/l 5/31 >> no DVT CT chest 5/31 >> atherosclerosis, extensive GGO with inter and intralobular thickening, fatty liver   Micro Data:  COVID 5/29 >> negative COVID 5/29 >> negative Influenza 5/30 >> negative BAL 6/02 >> Candida BAL AFB 6/02 >>  BAL Fungal 6/02 >> Yeast PCP 6/02 >> negative  Antimicrobials:  Vancomycin 5/28 >> 6/05 Cefepime 5/28 >> 6/05 Bactrim 6/01 >> 6/02  Interim history/subjective:  Agitated this morning with WUA.  Remains on increased PEEP.  Objective   Blood pressure 123/69, pulse 66, temperature 97.7 F (36.5 C), temperature source Oral, resp. rate 11, height _0  (1.88 m), weight 124.4 kg, SpO2 96 %.    Vent Mode: PRVC FiO2 (%):  [40 %-50 %] 45 % Set Rate:  [30 bmp] 30 bmp Vt Set:  [570 mL] 570 mL PEEP:  [10 cmH20] 10 cmH20 Plateau Pressure:  [25  cmH20-30 cmH20] 30 cmH20   Intake/Output Summary (Last 24 hours) at 09/16/2018 0958 Last data filed at 09/16/2018 0957 Gross per 24 hour  Intake 5378.86 ml  Output 1075 ml  Net 4303.86 ml   Filed Weights   09/14/18 0500 09/15/18 0412 09/16/18 0500  Weight: 115.5 kg 120.5 kg 124.4 kg    Examination:  General - agitated Eyes - pupils reactive ENT - ETT in place Cardiac - regular, tachycardic Chest - scattered rhonchi Abdomen - soft, non tender, + bowel sounds Extremities - 1+ edema Skin - no rashes Neuro - RASS +2   Resolved Hospital Problem list     Assessment & Plan:   Acute hypoxemic respiratory failure groundglass opacities. Plan - ABx d/ced - start to wean solumedrol - goal SpO2 88 to 95% - f/u CXR, ABG  Paroxysmal atrial fibrillation Plan - hold heparin due to recent episode of epistaxis  Anemia from bleeding and in setting of malignancy and critical illness. Thrombocytopenia. Plan - f/u CBC - transfuse for Hb < 7  Metastatic bladder cancer on targeted therapy. Plan -Oncology following  AKI from hypoxia, bactrim. Hyperkalemia. Chronic kidney disease >> baseline creatinine 1.80 from 07/31/18. Plan - f/u BMET - IV fluids at 50 ml/hr - defer renal assessment for now  Steroid induced hyperglycemia. Plan - Hyperglycemia 2 protocol >> likely can transition to Hyperglycemia protocol 3 later today  Acute metabolic encephalopathy. Plan - RASS goal -1 to -2 - precedex, fentanyl gtt with  prn versed  Best practice:  Diet: Continue tube feeds DVT prophylaxis: On heparinGI prophylaxis: protonix Mobility: Bed rest Code Status: full code Disposition: ICU  Labs    CMP Latest Ref Rng & Units 09/16/2018 09/15/2018 09/15/2018  Glucose 70 - 99 mg/dL 248(H) 207(H) 182(H)  BUN 8 - 23 mg/dL 155(H) 142(H) 95(H)  Creatinine 0.61 - 1.24 mg/dL 3.84(H) 3.79(H) 3.76(H)  Sodium 135 - 145 mmol/L 136 136 136  Potassium 3.5 - 5.1 mmol/L 5.3(H) 5.1 5.3(H)  Chloride 98 - 111  mmol/L 111 110 110  CO2 22 - 32 mmol/L 17(L) 16(L) 18(L)  Calcium 8.9 - 10.3 mg/dL 8.2(L) 7.9(L) 7.9(L)  Total Protein 6.5 - 8.1 g/dL - - -  Total Bilirubin 0.3 - 1.2 mg/dL - - -  Alkaline Phos 38 - 126 U/L - - -  AST 15 - 41 U/L - - -  ALT 0 - 44 U/L - - -   CBC Latest Ref Rng & Units 09/15/2018 09/15/2018 09/14/2018  WBC 4.0 - 10.5 K/uL - 6.0 8.9  Hemoglobin 13.0 - 17.0 g/dL 7.1(L) 6.5(LL) 7.3(L)  Hematocrit 39.0 - 52.0 % 22.8(L) 20.9(L) 23.7(L)  Platelets 150 - 400 K/uL - 59(L) 81(L)   ABG    Component Value Date/Time   PHART 7.233 (L) 09/15/2018 0400   PCO2ART 44.7 09/15/2018 0400   PO2ART 100 09/15/2018 0400   HCO3 17.9 (L) 09/15/2018 0400   TCO2 29 09/20/2016 0657   ACIDBASEDEF 8.3 (H) 09/15/2018 0400   O2SAT 96.4 09/15/2018 0400   CBG (last 3)  Recent Labs    09/16/18 0706 09/16/18 0803 09/16/18 0905  GLUCAP 207* 202* 168*   CC time 32 minutes  [name], MD [place] 09/16/2018, 10:12 AM

## 2018-09-17 ENCOUNTER — Inpatient Hospital Stay (HOSPITAL_COMMUNITY): Payer: PPO

## 2018-09-17 LAB — GLUCOSE, CAPILLARY
Glucose-Capillary: 117 mg/dL — ABNORMAL HIGH (ref 70–99)
Glucose-Capillary: 132 mg/dL — ABNORMAL HIGH (ref 70–99)
Glucose-Capillary: 139 mg/dL — ABNORMAL HIGH (ref 70–99)
Glucose-Capillary: 141 mg/dL — ABNORMAL HIGH (ref 70–99)
Glucose-Capillary: 147 mg/dL — ABNORMAL HIGH (ref 70–99)
Glucose-Capillary: 150 mg/dL — ABNORMAL HIGH (ref 70–99)
Glucose-Capillary: 151 mg/dL — ABNORMAL HIGH (ref 70–99)
Glucose-Capillary: 152 mg/dL — ABNORMAL HIGH (ref 70–99)
Glucose-Capillary: 155 mg/dL — ABNORMAL HIGH (ref 70–99)
Glucose-Capillary: 158 mg/dL — ABNORMAL HIGH (ref 70–99)
Glucose-Capillary: 161 mg/dL — ABNORMAL HIGH (ref 70–99)
Glucose-Capillary: 164 mg/dL — ABNORMAL HIGH (ref 70–99)
Glucose-Capillary: 165 mg/dL — ABNORMAL HIGH (ref 70–99)
Glucose-Capillary: 168 mg/dL — ABNORMAL HIGH (ref 70–99)
Glucose-Capillary: 169 mg/dL — ABNORMAL HIGH (ref 70–99)
Glucose-Capillary: 170 mg/dL — ABNORMAL HIGH (ref 70–99)
Glucose-Capillary: 174 mg/dL — ABNORMAL HIGH (ref 70–99)
Glucose-Capillary: 182 mg/dL — ABNORMAL HIGH (ref 70–99)
Glucose-Capillary: 185 mg/dL — ABNORMAL HIGH (ref 70–99)
Glucose-Capillary: 188 mg/dL — ABNORMAL HIGH (ref 70–99)
Glucose-Capillary: 217 mg/dL — ABNORMAL HIGH (ref 70–99)

## 2018-09-17 LAB — CBC
HCT: 24.1 % — ABNORMAL LOW (ref 39.0–52.0)
Hemoglobin: 7.5 g/dL — ABNORMAL LOW (ref 13.0–17.0)
MCH: 30.6 pg (ref 26.0–34.0)
MCHC: 31.1 g/dL (ref 30.0–36.0)
MCV: 98.4 fL (ref 80.0–100.0)
Platelets: 50 10*3/uL — ABNORMAL LOW (ref 150–400)
RBC: 2.45 MIL/uL — ABNORMAL LOW (ref 4.22–5.81)
RDW: 17 % — ABNORMAL HIGH (ref 11.5–15.5)
WBC: 5.6 10*3/uL (ref 4.0–10.5)
nRBC: 6.3 % — ABNORMAL HIGH (ref 0.0–0.2)

## 2018-09-17 LAB — BASIC METABOLIC PANEL
Anion gap: 7 (ref 5–15)
BUN: 176 mg/dL — ABNORMAL HIGH (ref 8–23)
CO2: 19 mmol/L — ABNORMAL LOW (ref 22–32)
Calcium: 8.4 mg/dL — ABNORMAL LOW (ref 8.9–10.3)
Chloride: 114 mmol/L — ABNORMAL HIGH (ref 98–111)
Creatinine, Ser: 3.96 mg/dL — ABNORMAL HIGH (ref 0.61–1.24)
GFR calc Af Amer: 17 mL/min — ABNORMAL LOW (ref >60)
GFR calc non Af Amer: 15 mL/min — ABNORMAL LOW (ref >60)
Glucose, Bld: 177 mg/dL — ABNORMAL HIGH (ref 70–99)
Potassium: 5.6 mmol/L — ABNORMAL HIGH (ref 3.5–5.1)
Sodium: 140 mmol/L (ref 135–145)

## 2018-09-17 LAB — BLOOD GAS, ARTERIAL

## 2018-09-17 MED ORDER — INSULIN DETEMIR 100 UNIT/ML ~~LOC~~ SOLN
10.0000 [IU] | Freq: Two times a day (BID) | SUBCUTANEOUS | Status: DC
  Administered 2018-09-17 – 2018-09-18 (×2): 10 [IU] via SUBCUTANEOUS
  Filled 2018-09-17 (×2): qty 0.1

## 2018-09-17 MED ORDER — DEXMEDETOMIDINE HCL IN NACL 400 MCG/100ML IV SOLN
0.4000 ug/kg/h | INTRAVENOUS | Status: DC
  Administered 2018-09-17 – 2018-09-18 (×5): 0.8 ug/kg/h via INTRAVENOUS
  Administered 2018-09-18: 0.7 ug/kg/h via INTRAVENOUS
  Administered 2018-09-19 (×2): 0.8 ug/kg/h via INTRAVENOUS
  Administered 2018-09-19: 1 ug/kg/h via INTRAVENOUS
  Administered 2018-09-19: 0.5 ug/kg/h via INTRAVENOUS
  Administered 2018-09-20 (×2): 0.7 ug/kg/h via INTRAVENOUS
  Filled 2018-09-17 (×12): qty 100

## 2018-09-17 MED ORDER — DEXTROSE 10 % IV SOLN
INTRAVENOUS | Status: DC | PRN
  Administered 2018-09-20 – 2018-09-22 (×2): via INTRAVENOUS
  Filled 2018-09-17 (×7): qty 1000

## 2018-09-17 MED ORDER — SODIUM BICARBONATE 8.4 % IV SOLN
INTRAVENOUS | Status: AC
  Administered 2018-09-17 – 2018-09-19 (×3): via INTRAVENOUS
  Filled 2018-09-17 (×4): qty 150

## 2018-09-17 MED ORDER — INSULIN ASPART 100 UNIT/ML ~~LOC~~ SOLN
1.0000 [IU] | SUBCUTANEOUS | Status: DC
  Administered 2018-09-17 – 2018-09-18 (×2): 2 [IU] via SUBCUTANEOUS
  Administered 2018-09-18 – 2018-09-19 (×8): 3 [IU] via SUBCUTANEOUS
  Administered 2018-09-19 (×3): 2 [IU] via SUBCUTANEOUS
  Administered 2018-09-20: 1 [IU] via SUBCUTANEOUS
  Administered 2018-09-20: 2 [IU] via SUBCUTANEOUS
  Administered 2018-09-20: 1 [IU] via SUBCUTANEOUS
  Administered 2018-09-20: 2 [IU] via SUBCUTANEOUS
  Administered 2018-09-21 – 2018-09-22 (×3): 1 [IU] via SUBCUTANEOUS

## 2018-09-17 NOTE — Progress Notes (Signed)
Pt hr 58, cardizem was not given.

## 2018-09-17 NOTE — Progress Notes (Signed)
NAME:  [name] Rose, MRN:  [id number], DOB:  28-Jul-1950, LOS: 38 ADMISSION DATE:  09/08/2018, CONSULTATION DATE:  [id number] REFERRING MD:  Dr. Sloan Leiter, [facility], CHIEF COMPLAINT:  Short of breath   Brief History   68 yo male former smoker presented to ER with weakness, hypoxia, fever 101.5.  He has hx of metastatic bladder cancer on targeted therapy with Padcev x 3 cycles.  CXR showed pulmonary infiltrates.  COVID testing negative x 2.  He had progressive hypoxia and PCCM asked to assess.  Past Medical History  HTN, HLD, Prostate cancer, PAF, CKD 3, MSSA Bacteremia March 2020, chemotherapy induced neutropenia, moderate mitral regurgitation  Significant Hospital Events   5/29 Admit 5/31 solumedrol started, heparin gtt started, transfuse PRBC 6/01 add bactrim for possible PCP 6/02 VDRF 6/04 d/c heparin gtt due to epistaxis 6/05 d/c ABx, transfuse PRBC 6/06 start to wean steroids 6/07 resume heparin gtt  Consults:  Oncology 5/30 bladder cancer ID 5/31 atypical pneumonia  Procedures:  ETT 6/02 >>  Bronchoscopy 6/02 >> fungal elements on cytology, WBC 220 (76% N, 18% L, 6% M)  Significant Diagnostic Tests:  Dopper legs b/l 5/31 >> no DVT CT chest 5/31 >> atherosclerosis, extensive GGO with inter and intralobular thickening, fatty liver   Micro Data:  COVID 5/29 >> negative COVID 5/29 >> negative Influenza 5/30 >> negative BAL 6/02 >> Candida BAL AFB 6/02 >>  BAL Fungal 6/02 >> Yeast PCP 6/02 >> negative  Antimicrobials:  Vancomycin 5/28 >> 6/05 Cefepime 5/28 >> 6/05 Bactrim 6/01 >> 6/02  Interim history/subjective:  Increase RR, low Vt with SBT.  Objective   Blood pressure 135/84, pulse 88, temperature (!) 97.5 F (36.4 C), temperature source Oral, resp. rate 14, height _0  (1.88 m), weight 124.4 kg, SpO2 (!) 87 %.    Vent Mode: PRVC FiO2 (%):  [40 %-50 %] 40 % Set Rate:  [30 bmp] 30 bmp Vt Set:  [570 mL] 570 mL PEEP:  [8 cmH20] 8 cmH20 Plateau Pressure:  [29  cmH20-31 cmH20] 31 cmH20   Intake/Output Summary (Last 24 hours) at 09/17/2018 0931 Last data filed at 09/17/2018 1517 Gross per 24 hour  Intake 2611.34 ml  Output 1450 ml  Net 1161.34 ml   Filed Weights   09/14/18 0500 09/15/18 0412 09/16/18 0500  Weight: 115.5 kg 120.5 kg 124.4 kg    Examination:  General - agitated Eyes - pupils reactive ENT - ETT in place Cardiac - regular, tachycardic Chest - scattered rhonchi Abdomen - soft, non tender, + bowel sounds Extremities - 1+ edema Skin - no rashes Neuro - RASS +2   Resolved Hospital Problem list     Assessment & Plan:   Acute hypoxemic respiratory failure groundglass opacities. Plan - wean off solumedrol as able - goal SpO2 88 to 95% - f/u CXR  Paroxysmal atrial fibrillation Plan - resume heparin gtt 6/07  Anemia from bleeding and in setting of malignancy and critical illness. Thrombocytopenia. Plan - f/u CBC - transfuse for Hb < 7  Metastatic bladder cancer on targeted therapy. Plan - oncology following  AKI from hypoxia, bactrim >> creatinine appears to be reaching plateau. Hyperkalemia. Non gap metabolic acidosis. Chronic kidney disease >> baseline creatinine 1.80 from 07/31/18. Plan - add HCO3 to IV fluid - f/u BMET - defer nephrology assessment for now  Steroid induced hyperglycemia. Plan - SSI  Acute metabolic encephalopathy. Plan - RASS goal -1 to -2 - precedex, fentanyl gtt with prn versed  Best practice:  Diet: Continue tube feeds DVT prophylaxis: heparin gtt GI prophylaxis: protonix Mobility: Bed rest Code Status: full code Disposition: ICU  Labs    CMP Latest Ref Rng & Units 09/17/2018 09/16/2018 09/15/2018  Glucose 70 - 99 mg/dL 177(H) 248(H) 207(H)  BUN 8 - 23 mg/dL 176(H) 155(H) 142(H)  Creatinine 0.61 - 1.24 mg/dL 3.96(H) 3.84(H) 3.79(H)  Sodium 135 - 145 mmol/L 140 136 136  Potassium 3.5 - 5.1 mmol/L 5.6(H) 5.3(H) 5.1  Chloride 98 - 111 mmol/L 114(H) 111 110  CO2 22 - 32 mmol/L  19(L) 17(L) 16(L)  Calcium 8.9 - 10.3 mg/dL 8.4(L) 8.2(L) 7.9(L)  Total Protein 6.5 - 8.1 g/dL - - -  Total Bilirubin 0.3 - 1.2 mg/dL - - -  Alkaline Phos 38 - 126 U/L - - -  AST 15 - 41 U/L - - -  ALT 0 - 44 U/L - - -   CBC Latest Ref Rng & Units 09/17/2018 09/15/2018 09/15/2018  WBC 4.0 - 10.5 K/uL 5.6 - 6.0  Hemoglobin 13.0 - 17.0 g/dL 7.5(L) 7.1(L) 6.5(LL)  Hematocrit 39.0 - 52.0 % 24.1(L) 22.8(L) 20.9(L)  Platelets 150 - 400 K/uL 50(L) - 59(L)   ABG    Component Value Date/Time   PHART 7.255 (L) 09/17/2018 0317   PCO2ART 39.0 09/17/2018 0317   PO2ART 66.3 (L) 09/17/2018 0317   HCO3 16.7 (L) 09/17/2018 0317   TCO2 29 09/20/2016 0657   ACIDBASEDEF 9.2 (H) 09/17/2018 0317   O2SAT 90.7 09/17/2018 0317   CBG (last 3)  Recent Labs    09/17/18 0634 09/17/18 0732 09/17/18 0857  GLUCAP 150* 158* 185*   CC time 34 minutes  [name], MD [facility] Pulmonary/Critical Care 09/17/2018, 9:31 AM

## 2018-09-17 NOTE — Progress Notes (Signed)
Spoke with pt's wife over the phone and updated about plan.  Also asked her to start considering what to do if he isn't able to make further progress quickly with vent weaning and if he needed more long term support.  [name], MD [facility] Pulmonary/Critical Care 09/17/2018, 2:43 PM

## 2018-09-18 ENCOUNTER — Encounter (HOSPITAL_COMMUNITY): Payer: Self-pay | Admitting: Nephrology

## 2018-09-18 ENCOUNTER — Inpatient Hospital Stay (HOSPITAL_COMMUNITY): Payer: PPO

## 2018-09-18 ENCOUNTER — Ambulatory Visit: Payer: PPO

## 2018-09-18 ENCOUNTER — Other Ambulatory Visit: Payer: PPO

## 2018-09-18 DIAGNOSIS — N19 Unspecified kidney failure: Secondary | ICD-10-CM

## 2018-09-18 DIAGNOSIS — J9690 Respiratory failure, unspecified, unspecified whether with hypoxia or hypercapnia: Secondary | ICD-10-CM

## 2018-09-18 DIAGNOSIS — N179 Acute kidney failure, unspecified: Secondary | ICD-10-CM | POA: Diagnosis present

## 2018-09-18 DIAGNOSIS — Z978 Presence of other specified devices: Secondary | ICD-10-CM

## 2018-09-18 DIAGNOSIS — I4891 Unspecified atrial fibrillation: Secondary | ICD-10-CM

## 2018-09-18 LAB — GLUCOSE, CAPILLARY
Glucose-Capillary: 175 mg/dL — ABNORMAL HIGH (ref 70–99)
Glucose-Capillary: 204 mg/dL — ABNORMAL HIGH (ref 70–99)
Glucose-Capillary: 209 mg/dL — ABNORMAL HIGH (ref 70–99)
Glucose-Capillary: 224 mg/dL — ABNORMAL HIGH (ref 70–99)
Glucose-Capillary: 237 mg/dL — ABNORMAL HIGH (ref 70–99)
Glucose-Capillary: 250 mg/dL — ABNORMAL HIGH (ref 70–99)
Glucose-Capillary: 259 mg/dL — ABNORMAL HIGH (ref 70–99)
Glucose-Capillary: 282 mg/dL — ABNORMAL HIGH (ref 70–99)

## 2018-09-18 LAB — BASIC METABOLIC PANEL
Anion gap: 11 (ref 5–15)
Anion gap: 10 (ref 5–15)
BUN: 198 mg/dL — ABNORMAL HIGH (ref 8–23)
BUN: 197 mg/dL — ABNORMAL HIGH (ref 8–23)
CO2: 18 mmol/L — ABNORMAL LOW (ref 22–32)
CO2: 18 mmol/L — ABNORMAL LOW (ref 22–32)
Calcium: 8.3 mg/dL — ABNORMAL LOW (ref 8.9–10.3)
Calcium: 8.1 mg/dL — ABNORMAL LOW (ref 8.9–10.3)
Chloride: 111 mmol/L (ref 98–111)
Chloride: 112 mmol/L — ABNORMAL HIGH (ref 98–111)
Creatinine, Ser: 4.54 mg/dL — ABNORMAL HIGH (ref 0.61–1.24)
Creatinine, Ser: 4.51 mg/dL — ABNORMAL HIGH (ref 0.61–1.24)
GFR calc Af Amer: 14 mL/min — ABNORMAL LOW (ref >60)
GFR calc Af Amer: 15 mL/min — ABNORMAL LOW (ref >60)
GFR calc non Af Amer: 12 mL/min — ABNORMAL LOW (ref >60)
GFR calc non Af Amer: 13 mL/min — ABNORMAL LOW (ref >60)
Glucose, Bld: 238 mg/dL — ABNORMAL HIGH (ref 70–99)
Glucose, Bld: 303 mg/dL — ABNORMAL HIGH (ref 70–99)
Potassium: 6.4 mmol/L (ref 3.5–5.1)
Potassium: 5.7 mmol/L — ABNORMAL HIGH (ref 3.5–5.1)
Sodium: 140 mmol/L (ref 135–145)
Sodium: 140 mmol/L (ref 135–145)

## 2018-09-18 LAB — RENAL FUNCTION PANEL
Albumin: 2.1 g/dL — ABNORMAL LOW (ref 3.5–5.0)
Anion gap: 10 (ref 5–15)
BUN: 201 mg/dL — ABNORMAL HIGH (ref 8–23)
CO2: 20 mmol/L — ABNORMAL LOW (ref 22–32)
Calcium: 8.2 mg/dL — ABNORMAL LOW (ref 8.9–10.3)
Chloride: 110 mmol/L (ref 98–111)
Creatinine, Ser: 4.37 mg/dL — ABNORMAL HIGH (ref 0.61–1.24)
GFR calc Af Amer: 15 mL/min — ABNORMAL LOW (ref >60)
GFR calc non Af Amer: 13 mL/min — ABNORMAL LOW (ref >60)
Glucose, Bld: 265 mg/dL — ABNORMAL HIGH (ref 70–99)
Phosphorus: 6.6 mg/dL — ABNORMAL HIGH (ref 2.5–4.6)
Potassium: 5.8 mmol/L — ABNORMAL HIGH (ref 3.5–5.1)
Sodium: 140 mmol/L (ref 135–145)

## 2018-09-18 LAB — SAVE SMEAR(SSMR), FOR PROVIDER SLIDE REVIEW

## 2018-09-18 LAB — URINALYSIS, ROUTINE W REFLEX MICROSCOPIC
Bilirubin Urine: NEGATIVE
Glucose, UA: >=500 mg/dL — AB
Ketones, ur: NEGATIVE mg/dL
Leukocytes,Ua: NEGATIVE
Nitrite: NEGATIVE
Protein, ur: NEGATIVE mg/dL
RBC / HPF: >50 RBC/hpf — ABNORMAL HIGH (ref 0–5)
Specific Gravity, Urine: 1.011 (ref 1.005–1.030)
pH: 5 (ref 5.0–8.0)

## 2018-09-18 LAB — CBC
HCT: 24.2 % — ABNORMAL LOW (ref 39.0–52.0)
Hemoglobin: 7.7 g/dL — ABNORMAL LOW (ref 13.0–17.0)
MCH: 31.4 pg (ref 26.0–34.0)
MCHC: 31.8 g/dL (ref 30.0–36.0)
MCV: 98.8 fL (ref 80.0–100.0)
Platelets: 48 10*3/uL — ABNORMAL LOW (ref 150–400)
RBC: 2.45 MIL/uL — ABNORMAL LOW (ref 4.22–5.81)
RDW: 17.2 % — ABNORMAL HIGH (ref 11.5–15.5)
WBC: 7.6 10*3/uL (ref 4.0–10.5)
nRBC: 5 % — ABNORMAL HIGH (ref 0.0–0.2)

## 2018-09-18 LAB — RETICULOCYTES
Immature Retic Fract: 30.3 % — ABNORMAL HIGH (ref 2.3–15.9)
RBC.: 2.57 MIL/uL — ABNORMAL LOW (ref 4.22–5.81)
Retic Count, Absolute: 94.1 10*3/uL (ref 19.0–186.0)
Retic Ct Pct: 3.7 % — ABNORMAL HIGH (ref 0.4–3.1)

## 2018-09-18 LAB — SODIUM, URINE, RANDOM: Sodium, Ur: 50 mmol/L

## 2018-09-18 LAB — CREATININE, URINE, RANDOM: Creatinine, Urine: 45.72 mg/dL

## 2018-09-18 MED ORDER — ALTEPLASE 2 MG IJ SOLR: 2.0000 mg | Freq: Once | INTRAMUSCULAR | Status: DC | PRN

## 2018-09-18 MED ORDER — ATROPINE SULFATE 1 MG/10ML IJ SOSY
PREFILLED_SYRINGE | INTRAMUSCULAR | Status: AC
  Filled 2018-09-18: qty 10

## 2018-09-18 MED ORDER — INSULIN DETEMIR 100 UNIT/ML ~~LOC~~ SOLN
15.0000 [IU] | Freq: Two times a day (BID) | SUBCUTANEOUS | Status: DC
  Administered 2018-09-18 – 2018-09-20 (×4): 15 [IU] via SUBCUTANEOUS
  Filled 2018-09-18 (×8): qty 0.15

## 2018-09-18 MED ORDER — INSULIN ASPART 100 UNIT/ML ~~LOC~~ SOLN
3.0000 [IU] | SUBCUTANEOUS | Status: DC
  Administered 2018-09-18 – 2018-09-20 (×13): 3 [IU] via SUBCUTANEOUS

## 2018-09-18 MED ORDER — HEPARIN BOLUS VIA INFUSION (CRRT)
1000.0000 [IU] | INTRAVENOUS | Status: DC | PRN
  Administered 2018-09-18 (×2): 1000 [IU] via INTRAVENOUS_CENTRAL
  Filled 2018-09-18: qty 1000

## 2018-09-18 MED ORDER — HEPARIN (PORCINE) 25000 UT/250ML-% IV SOLN: 500.0000 [IU]/h | INTRAVENOUS | Status: DC

## 2018-09-18 MED ORDER — HEPARIN SODIUM (PORCINE) 1000 UNIT/ML DIALYSIS
1000.0000 [IU] | INTRAMUSCULAR | Status: DC | PRN
  Filled 2018-09-18: qty 6

## 2018-09-18 MED ORDER — CHLORHEXIDINE GLUCONATE 0.12% ORAL RINSE (MEDLINE KIT)
15.0000 mL | Freq: Two times a day (BID) | OROMUCOSAL | Status: DC
  Administered 2018-09-18 – 2018-09-23 (×11): 15 mL via OROMUCOSAL

## 2018-09-18 MED ORDER — SODIUM CHLORIDE 0.9 % IV SOLN
250.0000 [IU]/h | INTRAVENOUS | Status: DC
  Administered 2018-09-18: 250 [IU]/h via INTRAVENOUS_CENTRAL
  Administered 2018-09-19: 1350 [IU]/h via INTRAVENOUS_CENTRAL
  Administered 2018-09-19: 1250 [IU]/h via INTRAVENOUS_CENTRAL
  Administered 2018-09-19: 1450 [IU]/h via INTRAVENOUS_CENTRAL
  Filled 2018-09-18: qty 2

## 2018-09-18 MED ORDER — SODIUM POLYSTYRENE SULFONATE 15 GM/60ML PO SUSP
30.0000 g | Freq: Once | ORAL | Status: AC
  Administered 2018-09-18: 06:00:00 30 g
  Filled 2018-09-18: qty 120

## 2018-09-18 MED ORDER — HEPARIN (PORCINE) 2000 UNITS/L FOR CRRT: INTRAVENOUS_CENTRAL | Status: DC | PRN

## 2018-09-18 MED ORDER — HEPARIN SODIUM (PORCINE) 1000 UNIT/ML IJ SOLN
3000.0000 [IU] | INTRAMUSCULAR | Status: DC | PRN
  Administered 2018-09-23: 3000 [IU] via INTRAVENOUS
  Filled 2018-09-18 (×2): qty 3

## 2018-09-18 MED ORDER — CHLORHEXIDINE GLUCONATE CLOTH 2 % EX PADS
6.0000 | MEDICATED_PAD | Freq: Every day | CUTANEOUS | Status: DC
  Administered 2018-09-18 – 2018-10-09 (×22): 6 via TOPICAL

## 2018-09-18 MED ORDER — PRISMASOL BGK 4/2.5 32-4-2.5 MEQ/L REPLACEMENT SOLN
Status: DC
  Administered 2018-09-18 – 2018-09-22 (×6): via INTRAVENOUS_CENTRAL
  Filled 2018-09-18 (×14): qty 5000

## 2018-09-18 MED ORDER — GABAPENTIN 300 MG PO CAPS
300.0000 mg | ORAL_CAPSULE | Freq: Two times a day (BID) | ORAL | Status: DC
  Administered 2018-09-18 – 2018-09-20 (×4): 300 mg
  Filled 2018-09-18 (×4): qty 1

## 2018-09-18 MED ORDER — ORAL CARE MOUTH RINSE
15.0000 mL | OROMUCOSAL | Status: DC
  Administered 2018-09-18 – 2018-09-23 (×54): 15 mL via OROMUCOSAL

## 2018-09-18 MED ORDER — PRISMASOL BGK 4/2.5 32-4-2.5 MEQ/L REPLACEMENT SOLN
Status: DC
  Administered 2018-09-18 – 2018-09-20 (×4): via INTRAVENOUS_CENTRAL
  Filled 2018-09-18 (×10): qty 5000

## 2018-09-18 MED ORDER — PRISMASOL BGK 4/2.5 32-4-2.5 MEQ/L IV SOLN
INTRAVENOUS | Status: DC
  Administered 2018-09-18 – 2018-09-21 (×17): via INTRAVENOUS_CENTRAL
  Filled 2018-09-18 (×34): qty 5000

## 2018-09-18 NOTE — Progress Notes (Signed)
Patient ID: [name] Rose, male   DOB: Jun 01, 1950, 68 y.o.   MRN: [id number]          [facility]    Date of Admission:  09/08/2018     He remains afebrile after completing 8 days of empiric therapy for healthcare associated pneumonia.  I will sign off now.         Michel Bickers, MD [facility] [phone] pager   [phone] cell 09/18/2018, 3:44 PM

## 2018-09-18 NOTE — Procedures (Signed)
Central Venous Dialysis Catheter Insertion Procedure Note [name] Rose [id number] 12-Oct-1950  Procedure: Insertion of Central Venous Catheter Indications: Assessment of intravascular volume, Drug and/or fluid administration and Frequent blood sampling  Procedure Details Consent: Risks of procedure as well as the alternatives and risks of each were explained to the (patient/caregiver).  Consent for procedure obtained. Time Out: Verified patient identification, verified procedure, site/side was marked, verified correct patient position, special equipment/implants available, medications/allergies/relevent history reviewed, required imaging and test results available.  Performed ->REAL TIME [place] USED TO id AND CANNULATE VESSEL  Maximum sterile technique was used including antiseptics, cap, gloves, gown, hand hygiene, mask and sheet. Skin prep: Chlorhexidine; local anesthetic administered A antimicrobial bonded/coated triple lumen catheter was placed in the right internal jugular vein using the Seldinger technique.  Evaluation Blood flow good Complications: No apparent complications Patient did tolerate procedure well. Chest X-ray ordered to verify placement.  CXR: pending.  [name] 09/18/2018, 11:11 AM  Erick Colace ACNP-BC [place] Pager # [phone] OR # [phone] if no answer

## 2018-09-18 NOTE — Progress Notes (Signed)
CRRT initiated at 1940. Unable to perform ACT's at this time as we are awaiting delivery of control solution from [place]. ACT monitoring will begin when control solution is received and controls have been performed on iStat machine.

## 2018-09-18 NOTE — Progress Notes (Signed)
eLink Physician-Brief Progress Note Patient Name: [name] Rose DOB: 1951-04-01 MRN: [id number]   Date of Service  09/18/2018  HPI/Events of Note  Sinus Bradycardia - HR in 40's.  eICU Interventions  Will order: 1. Wean Precedex IV infusion as tolerated.  2. Hold Metoprolol dose for HR < 60. 3. Hold Diltiazem dose for HR < 60.      Intervention Category Major Interventions: Arrhythmia - evaluation and management  [name],[name] 09/18/2018, 11:35 PM

## 2018-09-18 NOTE — Progress Notes (Signed)
Unfortunately, things just are not getting better for Mr. Counihan.  His labs they show a BUN of 198 and a creatinine of 4.5.  He is still in atrial fibrillation.  He is still on the ventilator.  His potassium is 6.4.  His hemoglobin is 7.7.  Platelet count 48,000.  His chest x-rays have been holding pretty stable.  He still has these bilateral infiltrates.  I think it is obvious he is going to need dialysis.  Again, by his last PET scan, he seems to be in a remission.  He is getting tube feeds.  His vital signs still look pretty stable.  His oxygen saturation is okay.  Heart rate, despite being atrial fibrillation is well controlled.  I still am not sure as to what has triggered all of this.  I really cannot find anything regarding Padcev and causing respiratory failure or renal failure.  There is in the product literature reported a 3% incidence of renal failure.  I would have thought that he would have developed this earlier on in the treatment protocol.  I know that he is getting fantastic care from everybody down in the ICU.  I am not sure what helps can be done for him right now.  Again, I would think that dialysis is on the horizon given his BUN.  [name], MD  Psalm 85:6-7

## 2018-09-18 NOTE — Progress Notes (Signed)
CRITICAL VALUE ALERT  Critical Value: K 6.5  Date & Time Notied:  09/18/2018 0500  Provider Notified: E-Link   Orders Received/Actions taken: Awaiting new orders

## 2018-09-18 NOTE — Consult Note (Signed)
Renal Service Consult Note [facility]  [name] Rose 09/18/2018 [name] Requesting Physician:  Dr Halford Chessman  Reason for Consult:  AKI HPI: The patient is a 68 y.o. year-old w/ hx of HTN, UTI, hx prostate cancer and hx of metastatic bladder cancer IV on targeted Rx who presented to ED on 5/29 c/o gen'd weakness.  Family noted pt very weak after chemoRx.  Has hx of CKD III, PAF, CKD 3 and hx MSSA bacteremia as well in March.   He had been getting chemoRx on a 2 wks on/ 1 wk off schedule, last round was ~10 days prior to admission on 5/29.  In ED BP's were in the 80's initially and CXR showed diffuse IS ground glass changes. Pt was admitted for PNA possibly viral.  COVID local test was negative.  IV abx were given. Baseline line creat is 1.3- 1.8.  He got IV steroids as well and was intubated on 6/2.  Over the past 9 days creat has worsened from 1.78 up to 4.51 today, BUN is up to 197 today.  Pt making urine about 1100- 1400 cc per day. Asked to see for AKI.    Inpt IV abx  - azithro 5/31 > 6/5 - cefepime 5/28 > 6/5 - bactrim 6/1- 6/2 - vanc  5/28 then dc'd Baseline line creat is 1.3- 1.8 in Feb/ march of this year.    Total I/O this admit over 10 days are 27L in and 13 L out, for total of +14L. Wts are up 13 kg.    Past Medical History  Past Medical History:  Diagnosis Date  . Arthritis   . 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 ([place]) 09/29/2016  . Bladder tumor   . Essential hypertension 06/23/2018  . Goals of care, counseling/discussion 09/30/2016  . History of prostate cancer followed by pcp dr [name]-  per pt last PSA undetectable   dx 2008-- (Stage T1c, Gleason 3+3,  PSA 4.58, vol 99cc)  s/p  radical prostatectomy (nerve sparing bilateral)   . Hyperglycemia 06/23/2018  . Hypertension   . Lower urinary tract symptoms (LUTS)   . Mild hyperlipidemia 06/23/2018  . Pre-diabetes   . Wears glasses    Past Surgical History  Past Surgical History:   Procedure Laterality Date  . CATARACT EXTRACTION W/ INTRAOCULAR LENS  IMPLANT, BILATERAL Bilateral 2011  . [place]  . IR FLUORO GUIDE PORT INSERTION RIGHT  10/07/2016  . IR RADIOLOGIST EVAL & MGMT  01/05/2018  . IR US GUIDE VASC ACCESS RIGHT  10/07/2016  . KNEE ARTHROSCOPY Bilateral right 2006;  left 02-15-2007  . [place];  1990;  1983  . ROBOT ASSISTED LAPAROSCOPIC RADICAL PROSTATECTOMY  06/20/2006   bilateral nerve sparing  . TEE WITHOUT CARDIOVERSION N/A 06/27/2018   Procedure: TRANSESOPHAGEAL ECHOCARDIOGRAM (TEE);  Surgeon: Nigel Mormon, MD;  Location: [facility] ENDOSCOPY;  Service: Cardiovascular;  Laterality: N/A;  . TOTAL HIP ARTHROPLASTY Left 03/03/2015   Procedure: LEFT TOTAL HIP ARTHROPLASTY ANTERIOR APPROACH;  Surgeon: Dorna Leitz, MD;  Location: [place];  Service: Orthopedics;  Laterality: Left;  . TOTAL KNEE ARTHROPLASTY Bilateral left 08-13-2009;  right 12-26-2009  . TRANSURETHRAL RESECTION OF BLADDER TUMOR N/A 09/20/2016   Procedure: TRANSURETHRAL RESECTION OF BLADDER TUMOR (TURBT);  Surgeon: Franchot Gallo, MD;  Location: [facility];  Service: Urology;  Laterality: N/A;   Family History  Family History  Problem Relation Age of Onset  . Hypertension Mother   .  Aneurysm Mother   . Emphysema Father   . Hypertension Father    Social History  reports that he quit smoking about 34 years ago. His smoking use included cigarettes. He quit after 16.00 years of use. He has never used smokeless tobacco. He reports current alcohol use. He reports that he does not use drugs. Allergies No Known Allergies Home medications Prior to Admission medications   Medication Sig Start Date End Date Taking? Authorizing Provider  Cinnamon 500 MG TABS Take 1 tablet by mouth 2 (two) times daily.    Yes [provider]  dexamethasone (DECADRON) 4 MG tablet Take 2 tablets (8 mg total) by mouth daily. Start the day after chemotherapy for  2 days. 07/17/18  Yes Volanda Napoleon, MD  diltiazem (CARDIZEM CD) 240 MG 24 hr capsule Take 1 capsule (240 mg total) by mouth daily. 06/28/18  Yes Purohit, Konrad Dolores, MD  gabapentin (NEURONTIN) 400 MG capsule TAKE 1 CAPSULE (400 MG TOTAL) BY MOUTH 4 (FOUR) TIMES DAILY. 09/05/18  Yes Volanda Napoleon, MD  lidocaine-prilocaine (EMLA) cream Apply to affected area once 07/17/18  Yes Ennever, Rudell Cobb, MD  LORazepam (ATIVAN) 0.5 MG tablet TAKE 1 TABLET BY MOUTH EVERY 6 HOURS AS NEEDED FOR NAUSEA AND VOMITING Patient taking differently: Take 0.5 mg by mouth every 6 (six) hours as needed (nausea).  08/30/18  Yes [place], Holli Humbles, NP  losartan (COZAAR) 100 MG tablet Take 100 mg by mouth daily.   Yes [provider]  MAGNESIUM PO Take 400 mg by mouth at bedtime. Leg cramps   Yes [provider]  metoprolol tartrate (LOPRESSOR) 25 MG tablet Take 1 tablet (25 mg total) by mouth 2 (two) times daily. 06/28/18  Yes Purohit, Konrad Dolores, MD  ondansetron (ZOFRAN) 8 MG tablet Take 1 tablet (8 mg total) by mouth 2 (two) times daily as needed for refractory nausea / vomiting. Start on day 3 after chemo. 07/17/18  Yes Volanda Napoleon, MD  prochlorperazine (COMPAZINE) 10 MG tablet Take 1 tablet (10 mg total) by mouth every 6 (six) hours as needed (Nausea or vomiting). 07/17/18  Yes Ennever, Rudell Cobb, MD  Pyridoxine HCl (VITAMIN B-6) 250 MG tablet Take 1 tablet (250 mg total) by mouth daily. 12/23/17  Yes Aline August, MD  senna (SENOKOT) 8.6 MG TABS tablet Take 1 tablet (8.6 mg total) by mouth 2 (two) times daily. 12/23/17   Aline August, MD   Liver Function Tests Recent Labs  Lab 09/12/18 0439 09/13/18 0500  AST 50* 43*  ALT 34 32  ALKPHOS 97 71  BILITOT 0.2* 0.3  PROT 5.1* 4.8*  ALBUMIN 2.1* 2.1*   No results for input(s): LIPASE, AMYLASE in the last 168 hours. CBC Recent Labs  Lab 09/13/18 0500 09/14/18 0425 09/15/18 0404 09/15/18 1531 09/17/18 0500 09/18/18 0354  WBC 16.3* 8.9 6.0  --  5.6  7.6  NEUTROABS 14.0* 7.7 5.1  --   --   --   HGB 7.8* 7.3* 6.5* 7.1* 7.5* 7.7*  HCT 24.6* 23.7* 20.9* 22.8* 24.1* 24.2*  MCV 99.2 100.4* 100.0  --  98.4 98.8  PLT 139* 81* 59*  --  50* 48*   Basic Metabolic Panel Recent Labs  Lab 09/12/18 1015 09/12/18 1900 09/13/18 0500 09/13/18 2032  09/14/18 1728 09/15/18 0404 09/15/18 1828 09/16/18 0528 09/17/18 0500 09/18/18 0354 09/18/18 1223  NA  --   --  133*  --    < > 135 136 136 136 140 140  140  K  --   --  5.4*  --    < > 5.3* 5.3* 5.1 5.3* 5.6* 6.4* 5.7*  CL  --   --  107  --    < > 108 110 110 111 114* 111 112*  CO2  --   --  19*  --    < > 19* 18* 16* 17* 19* 18* 18*  GLUCOSE  --   --  164*  --    < > 239* 182* 207* 248* 177* 238* 303*  BUN  --   --  71*  --    < > 116* 95* 142* 155* 176* 198* 197*  CREATININE  --   --  2.76*  --    < > 3.64* 3.76* 3.79* 3.84* 3.96* 4.54* 4.51*  CALCIUM  --   --  7.8*  --    < > 7.7* 7.9* 7.9* 8.2* 8.4* 8.3* 8.1*  PHOS 6.0* 4.2 4.4 5.9*  --   --   --   --   --   --   --   --    < > = values in this interval not displayed.   Iron/TIBC/Ferritin/ %Sat    Component Value Date/Time   IRON 88 07/24/2018 1230   TIBC 240 07/24/2018 1230   FERRITIN 3,307 (H) 09/08/2018 1047   IRONPCTSAT 37 07/24/2018 1230    Vitals:   09/18/18 1100 09/18/18 1135 09/18/18 1136 09/18/18 1145  BP: 140/67  140/67   Pulse: 88  (!) 123 (!) 126  Resp: (!) 24  (!) 37 16  Temp:  98.1 F (36.7 C)    TempSrc:  Axillary    SpO2: 98%  94% 93%  Weight:      Height:        Exam Gen intubated and sedated, not responsive No rash, cyanosis or gangrene Sclera anicteric, throat w ETT  No jvd or bruits Chest clear bilat ant and lat RRR no MRG, tachy Abd soft ntnd no mass or ascites +bs very obese GU normal male w/ foley cath in  MS no joint effusions or deformity Ext diffuse 2-3+ LE edema and +UE edema, no wounds or ulcers Neuro is sedated on the vent    Home meds:  - diltiazem 240 qd/ losartan 100 qd/ metoprolol  25 bid  - gabapentin 300 tid/ lorazepam 0.$RemoveBeforeDE'5mg'IsliEJEIZZobMmM$  qid prn/ prochlorperazine 10 qid prn   - prn's/ vitamins/ supplements   Na 140  K 5.7  BUN 197  Cr 4.5  CO2 18  Ca 8.1  WBC 7k  Hb 7.7  plt 48  UA 11-20 wbc/ > 50 rbc/ rare bact, 0-5 epis, neg protein, 1.011, 5.0   UNa 50  Ucreat 45.7  Renal US today > IMPRESSION: Negative for hydronephrosis.  No acute abnormality.  Fatty infiltration a of the liver and small volume perihepatic ascites.  CXR > 6/8  IMPRESSION: Interval placement of right Vas-Cath with the tip in the SVC. No pneumothorax. Remainder the support devices are stable. Mild cardiomegaly, vascular congestion.  Interstitial and airspace opacities slightly improved.  Assessment: 1. Acute renal failure - gradual AKI over 8-9 days in hospital, some hypotension early in admission, due to shock /ATN vs other 2. Vent dep resp failure/ pulm infiltrates - per CCM sp IV abx thru 6/5 3. Vol overload - up 15kg diffuse edema 4. Bladder cancer stage IV - per oncology 5. HTN - getting some of his home meds, BP's ok 6. PAF  7. Anemia multifact    Plan: 1. CRRT , UF as tolerated      Kelly Splinter  MD 09/18/2018, 2:15 PM

## 2018-09-18 NOTE — Progress Notes (Signed)
eLink Physician-Brief Progress Note Patient Name: [name] Rose DOB: [birth year] MRN: [id number]   Date of Service  09/18/2018  HPI/Events of Note  K+ = 6.4.   eICU Interventions  Will order: 1. Kayexalate 30 gm per tube now. 2. Repeat BMP at 12 noon.      Intervention Category Major Interventions: Electrolyte abnormality - evaluation and management  Camilla Skeen Eugene 09/18/2018, 5:14 AM

## 2018-09-18 NOTE — Progress Notes (Signed)
Pt becomes very agitated and combative when sedation is cut down and placed on wean

## 2018-09-18 NOTE — Progress Notes (Addendum)
NAME:  [name] Rose, MRN:  [id number], DOB:  [birth year], LOS: 14 ADMISSION DATE:  09/08/2018, CONSULTATION DATE:  [id number] REFERRING MD:  Dr. Sloan Leiter, [facility], CHIEF COMPLAINT:  Short of breath   Brief History   68 yo male former smoker presented to ER with weakness, hypoxia, fever 101.5.  He has hx of metastatic bladder cancer on targeted therapy with Padcev x 3 cycles.  CXR showed pulmonary infiltrates.  COVID testing negative x 2.  He had progressive hypoxia and PCCM asked to assess.  Past Medical History  HTN, HLD, Prostate cancer, PAF, CKD 3, MSSA Bacteremia March 2020, chemotherapy induced neutropenia, moderate mitral regurgitation  Significant Hospital Events   5/29 Admit 5/31 solumedrol started, heparin gtt started, transfuse PRBC 6/01 add bactrim for possible PCP 6/02 VDRF 6/04 d/c heparin gtt due to epistaxis 6/05 d/c ABx, transfuse PRBC 6/06 start to wean steroids 6/07 resume heparin gtt 6/8 renal fxn worse. Volume overloaded and hyperkalemic w/ worsening acid base Consults:  Oncology 5/30 bladder cancer ID 5/31 atypical pneumonia  Procedures:  ETT 6/02 >>  Bronchoscopy 6/02 >> fungal elements on cytology, WBC 220 (76% N, 18% L, 6% M)  Significant Diagnostic Tests:  Dopper legs b/l 5/31 >> no DVT CT chest 5/31 >> atherosclerosis, extensive GGO with inter and intralobular thickening, fatty liver   Micro Data:  COVID 5/29 >> negative COVID 5/29 >> negative Influenza 5/30 >> negative BAL 6/02 >> Candida BAL AFB 6/02 >>  BAL Fungal 6/02 >> Yeast PCP 6/02 >> negative  Antimicrobials:  Vancomycin 5/28 >> 6/05 Cefepime 5/28 >> 6/05 Bactrim 6/01 >> 6/02  Interim history/subjective:  Not better. Renal fxn still declining   Objective   Blood pressure 117/73, pulse 70, temperature 98.4 F (36.9 C), temperature source Axillary, resp. rate (Abnormal) 40, height _0  (1.88 m), weight 128.7 kg, SpO2 96 %.    Vent Mode: PRVC FiO2 (%):  [40 %] 40 % Set Rate:  [30  bmp] 30 bmp Vt Set:  [570 mL] 570 mL PEEP:  [8 cmH20] 8 cmH20 Plateau Pressure:  [19 cmH20-33 cmH20] 30 cmH20   Intake/Output Summary (Last 24 hours) at 09/18/2018 0916 Last data filed at 09/18/2018 0854 Gross per 24 hour  Intake 2946.65 ml  Output 1600 ml  Net 1346.65 ml   Filed Weights   09/15/18 0412 09/16/18 0500 09/18/18 0500  Weight: 120.5 kg 124.4 kg 128.7 kg    Examination:  General 68 year old WM, sedated on vent  HENT NCAT orally intubated Pulm coarse scattered rhonchi, equal chest rise on vent  card RRR w/out MRG abd soft not tender + bowel sounds gu decreased UOP; Neuro arouses to stimulus but currently sedated   Resolved Hospital Problem list     Assessment & Plan:   Acute hypoxemic respiratory failure groundglass opacities. Plan Cont full vent support Cont systemic steroids Volume removal w/ HD Am cxr VAP bundle    AKI from hypoxia, bactrim >> creatinine worse  Hyperkalemia. Non gap metabolic acidosis cont to worsen Chronic kidney disease >> baseline creatinine 1.80 from 07/31/18. Plan Will cont bicarb infusion Looks like we may need dialysis support will ask renal to see Cont serial chem  Strict I&O  Paroxysmal atrial fibrillation Plan Cont tele monitoring  Holding IV heparin d/t plts and epistaxis   Anemia from bleeding and in setting of malignancy and critical illness. Thrombocytopenia. ->stable  Plan Cont to trend cbc Transfuse per protocol for hgb <7 and or active bleeding  Metastatic  bladder cancer on targeted therapy. Plan onc following   Steroid induced hyperglycemia. Plan ssi  Will adjust basal levimir and add scheduled aspart    Acute metabolic encephalopathy. Plan Cont PAD protocol  RASS goal -1 to -2 Cont precedex and PRN fent   Best practice:  Diet: Continue tube feeds DVT prophylaxis: heparin gtt GI prophylaxis: protonix Mobility: Bed rest Code Status: full code Disposition: ICU. Remains vent dependent.  Primary issues seems to be volume excess. I think he will need HD given volume status but also hyperkalemia. Will call renal  Erick Colace ACNP-BC [place] Pager # [phone] OR # [phone] if no answer   Attending section: Remains on full vent support.  Decreased urine outpt.  BP 140/67   Pulse (!) 126   Temp 98.1 F (36.7 C) (Axillary)   Resp 16   Ht _0  (1.88 m)   Wt 128.7 kg   SpO2 93%   BMI 36.43 kg/m   Sedated.  HR irregular.  B/l rhonchi.  Abdomen soft, non tender.  1+ edema.    CXR (reviewed by me) - b/l ASD  A/p  Acute hypoxic respiratory failure. - continue full vent support - continue solumedrol for now  AKI with hyperkalemia, non gap acidosis. - check renal u/s - nephrology consulted - likely will need CRRT  Thrombocytopenia. - hold heparin gtt  CC time by me independent of APP time and procedure time 33 minutes  [name], MD [place] 09/18/2018, 12:29 PM

## 2018-09-19 ENCOUNTER — Inpatient Hospital Stay (HOSPITAL_COMMUNITY): Payer: PPO

## 2018-09-19 LAB — RENAL FUNCTION PANEL
Albumin: 2.5 g/dL — ABNORMAL LOW (ref 3.5–5.0)
Albumin: 2.1 g/dL — ABNORMAL LOW (ref 3.5–5.0)
Anion gap: 10 (ref 5–15)
Anion gap: 8 (ref 5–15)
BUN: 144 mg/dL — ABNORMAL HIGH (ref 8–23)
BUN: 105 mg/dL — ABNORMAL HIGH (ref 8–23)
CO2: 22 mmol/L (ref 22–32)
CO2: 25 mmol/L (ref 22–32)
Calcium: 8.3 mg/dL — ABNORMAL LOW (ref 8.9–10.3)
Calcium: 8.1 mg/dL — ABNORMAL LOW (ref 8.9–10.3)
Chloride: 108 mmol/L (ref 98–111)
Chloride: 107 mmol/L (ref 98–111)
Creatinine, Ser: 3.39 mg/dL — ABNORMAL HIGH (ref 0.61–1.24)
Creatinine, Ser: 2.47 mg/dL — ABNORMAL HIGH (ref 0.61–1.24)
GFR calc Af Amer: 21 mL/min — ABNORMAL LOW (ref >60)
GFR calc Af Amer: 30 mL/min — ABNORMAL LOW (ref >60)
GFR calc non Af Amer: 18 mL/min — ABNORMAL LOW (ref >60)
GFR calc non Af Amer: 26 mL/min — ABNORMAL LOW (ref >60)
Glucose, Bld: 236 mg/dL — ABNORMAL HIGH (ref 70–99)
Glucose, Bld: 182 mg/dL — ABNORMAL HIGH (ref 70–99)
Phosphorus: 5.5 mg/dL — ABNORMAL HIGH (ref 2.5–4.6)
Phosphorus: 4.4 mg/dL (ref 2.5–4.6)
Potassium: 5.3 mmol/L — ABNORMAL HIGH (ref 3.5–5.1)
Potassium: 5.3 mmol/L — ABNORMAL HIGH (ref 3.5–5.1)
Sodium: 140 mmol/L (ref 135–145)
Sodium: 140 mmol/L (ref 135–145)

## 2018-09-19 LAB — POCT ACTIVATED CLOTTING TIME
Activated Clotting Time: 125 seconds
Activated Clotting Time: 142 seconds
Activated Clotting Time: 153 seconds
Activated Clotting Time: 153 seconds
Activated Clotting Time: 153 seconds
Activated Clotting Time: 153 seconds
Activated Clotting Time: 158 seconds
Activated Clotting Time: 158 seconds
Activated Clotting Time: 158 seconds
Activated Clotting Time: 158 seconds
Activated Clotting Time: 158 seconds

## 2018-09-19 LAB — CBC
HCT: 29.3 % — ABNORMAL LOW (ref 39.0–52.0)
Hemoglobin: 9.4 g/dL — ABNORMAL LOW (ref 13.0–17.0)
MCH: 31.4 pg (ref 26.0–34.0)
MCHC: 32.1 g/dL (ref 30.0–36.0)
MCV: 98 fL (ref 80.0–100.0)
Platelets: 65 10*3/uL — ABNORMAL LOW (ref 150–400)
RBC: 2.99 MIL/uL — ABNORMAL LOW (ref 4.22–5.81)
RDW: 17.7 % — ABNORMAL HIGH (ref 11.5–15.5)
WBC: 12.3 10*3/uL — ABNORMAL HIGH (ref 4.0–10.5)
nRBC: 6.8 % — ABNORMAL HIGH (ref 0.0–0.2)

## 2018-09-19 LAB — GLUCOSE, CAPILLARY
Glucose-Capillary: 164 mg/dL — ABNORMAL HIGH (ref 70–99)
Glucose-Capillary: 178 mg/dL — ABNORMAL HIGH (ref 70–99)
Glucose-Capillary: 187 mg/dL — ABNORMAL HIGH (ref 70–99)
Glucose-Capillary: 189 mg/dL — ABNORMAL HIGH (ref 70–99)
Glucose-Capillary: 217 mg/dL — ABNORMAL HIGH (ref 70–99)
Glucose-Capillary: 228 mg/dL — ABNORMAL HIGH (ref 70–99)

## 2018-09-19 LAB — APTT: aPTT: 51 seconds — ABNORMAL HIGH (ref 24–36)

## 2018-09-19 LAB — MAGNESIUM: Magnesium: 2.1 mg/dL (ref 1.7–2.4)

## 2018-09-19 LAB — LACTATE DEHYDROGENASE: LDH: 542 U/L — ABNORMAL HIGH (ref 98–192)

## 2018-09-19 MED ORDER — "THROMBI-PAD 3""X3"" EX PADS"
1.0000 | MEDICATED_PAD | Freq: Once | CUTANEOUS | Status: AC
  Administered 2018-09-19: 1 via TOPICAL
  Filled 2018-09-19: qty 1

## 2018-09-19 MED ORDER — SODIUM CHLORIDE 0.9 % IV SOLN
500.0000 [IU]/h | INTRAVENOUS | Status: DC
  Administered 2018-09-19 – 2018-09-20 (×3): 1000 [IU]/h via INTRAVENOUS_CENTRAL
  Filled 2018-09-19 (×2): qty 2

## 2018-09-19 NOTE — Progress Notes (Signed)
[name] Rose is now on CVVH for his renal failure.  His BUN and creatinine are better this morning.  He is still on the ventilator.  He is under some sedation.  He gets very anxious when he is more awake.  He has marked tachycardia.  I did have a long talk with his wife last night.  She understands what is going on.  She is very appreciative of the great care that he is getting from the staff in the ICU.  She wishes to pursue aggressive intervention for him right now.  She understands the possibility of him possibly needing a tracheotomy if he maintains on a ventilator for much longer.  His platelet count is better this morning.  It is now 68,000.  His hemoglobin is 9.4.  His white cell count is 12.3.  He does have a high LDH of 542.  He is getting tube feeds.  His potassium has come down.  It is 5.3 today.  His chest x-ray yesterday did seem to show some improvement with the infiltrates.  We will continue to follow along as needed.  Again, I know he is getting wonderful care by all the staff down in the ICU.  I know everybody is working really hard on him to pull him through all this.  Again, I know that he has metastatic disease but at least he is in remission right now and this definitely helps.  [name], MD  Psalm 119:41

## 2018-09-19 NOTE — Progress Notes (Addendum)
Patient awoke while turning with severe agitation and combative behaviors. Unable to redirect patient. Pt HR increased into the 160's. PRN versed given and MD notified to request new orders for bilateral wrist restraints in order to protect airway and CVVHD lines.

## 2018-09-19 NOTE — Progress Notes (Signed)
[facility] Progress Note  Subjective: CXR worse, no fluid off yest due to low BP's.  BP's up today, then down.   Vitals:   09/19/18 0900 09/19/18 1000 09/19/18 1100 09/19/18 1140  BP: (!) 165/108 (!) 175/94 121/68   Pulse: (!) 169 (!) 150 67   Resp: 18 (!) 33 (!) 38   Temp:      TempSrc:      SpO2: (!) 85% 92% 97% 97%  Weight:      Height:        Inpatient medications: . chlorhexidine gluconate (MEDLINE KIT)  15 mL Mouth Rinse BID  . Chlorhexidine Gluconate Cloth  6 each Topical Daily  . diltiazem  30 mg Per Tube Q8H  . feeding supplement (PRO-STAT SUGAR FREE 64)  60 mL Per Tube TID  . feeding supplement (VITAL HIGH PROTEIN)  1,000 mL Per Tube Q24H  . gabapentin  300 mg Per Tube BID  . insulin aspart  1-3 Units Subcutaneous Q4H  . insulin aspart  3 Units Subcutaneous Q4H  . insulin detemir  15 Units Subcutaneous Q12H  . mouth rinse  15 mL Mouth Rinse 10 times per day  . methylPREDNISolone (SOLU-MEDROL) injection  40 mg Intravenous Q12H  . pantoprazole sodium  40 mg Per Tube Q24H  . vitamin B-6  300 mg Per Tube Daily   .  prismasol BGK 4/2.5 500 mL/hr at 09/19/18 0549  .  prismasol BGK 4/2.5 300 mL/hr at 09/18/18 1940  . dexmedetomidine (PRECEDEX) IV infusion 0.8 mcg/kg/hr (09/19/18 1212)  . dextrose    . fentaNYL infusion INTRAVENOUS 225 mcg/hr (09/19/18 1021)  . heparin 10,000 units/ 20 mL infusion syringe 1,000 Units/hr (09/19/18 1200)  . prismasol BGK 4/2.5 2,000 mL/hr at 09/19/18 0824  .  sodium bicarbonate  infusion 1000 mL 50 mL/hr at 09/19/18 1000   acetaminophen **OR** acetaminophen, albuterol, alteplase, bisacodyl, dextrose, heparin, heparin, heparin, metoprolol tartrate, midazolam, [DISCONTINUED] ondansetron **OR** ondansetron (ZOFRAN) IV, sennosides, sodium chloride flush    Exam: Gen intubated and sedated, not responsive No rash, cyanosis or gangrene Sclera anicteric, throat w ETT  No jvd or bruits Chest clear bilat ant and lat RRR no  MRG, tachy Abd soft ntnd no mass or ascites +bs very obese GU normal male w/ foley cath in  MS no joint effusions or deformity Ext diffuse 2-3+ LE edema and +UE edema, no wounds or ulcers Neuro is sedated on the vent    Home meds:  - diltiazem 240 qd/ losartan 100 qd/ metoprolol 25 bid  - gabapentin 300 tid/ lorazepam 0.5mg qid prn/ prochlorperazine 10 qid prn   - prn's/ vitamins/ supplements   Na 140  K 5.7  BUN 197  Cr 4.5  CO2 18  Ca 8.1  WBC 7k  Hb 7.7  plt 48  UA 11-20 wbc/ > 50 rbc/ rare bact, 0-5 epis, neg protein, 1.011, 5.0   UNa 50  Ucreat 45.7  Renal US today > IMPRESSION: Negative for hydronephrosis. No acute abnormality.  Fatty infiltration a of the liver and small volume perihepatic ascites.  CXR > 6/8  IMPRESSION: Interval placement of right Vas-Cath with the tip in the SVC. No pneumothorax. Remainder the support devices are stable. Mild cardiomegaly, vascular congestion.  Interstitial and airspace opacities slightly improved.  Assessment: 1. Acute renal failure - gradual AKI over 8-9 days in hospital, some hypotension early in admission, due to shock /ATN vs other. CRRT started on 6/8.  D#2.  Worsening IS edema by   CXR, will add pressors as needed to pull fluid today 2. Vent dep resp failure/ pulm infiltrates - sp IV abx thru 6/5 3. Vol overload - up 15kg diffuse edema on exam 4. Bladder cancer stage IV - per oncology 5. HTN - BP's labile 6. Atrial fib - heart rates labile, afib, on per NG diltiazem 30 tid 7. Anemia multifact      [facility] 09/19/2018, 12:42 PM  Iron/TIBC/Ferritin/ %Sat    Component Value Date/Time   IRON 88 07/24/2018 1230   TIBC 240 07/24/2018 1230   FERRITIN 3,307 (H) 09/08/2018 1047   IRONPCTSAT 37 07/24/2018 1230   Recent Labs  Lab 09/19/18 0425  NA 140  K 5.3*  CL 108  CO2 22  GLUCOSE 236*  BUN 144*  CREATININE 3.39*  CALCIUM 8.3*  PHOS 5.5*  ALBUMIN 2.5*   Recent Labs  Lab 09/13/18 0500   AST 43*  ALT 32  ALKPHOS 71  BILITOT 0.3  PROT 4.8*   Recent Labs  Lab 09/19/18 0425  WBC 12.3*  HGB 9.4*  HCT 29.3*  PLT 65*

## 2018-09-19 NOTE — Progress Notes (Signed)
This nurse was called into patients room by nurse tech.   Patients HD catheter site was actively bleeding, dressing was saturated with blood pooling at insertion site.  Dressing removed, pressured applied initially with sterile gauze and then dressed with thrombi pads and gauze.  Catheter placement verified by xray.  Attending physician gave verbal order to redress HD catheter using sterile thrombi pad under dressing to control bleeding and to restart CRRT.

## 2018-09-19 NOTE — Progress Notes (Signed)
Pt awoke with blood sugar monitoring. Pt HR increased into 170's, biting ETT tube, decreased O2 saturations, elevated BP, and extreme agitation. Pt able to nod to yes/no questions- denies pain, cold/hot, endorses anxiety. Explained ETT to patient for respiratory support and CRRT to aid kidney function. Pt nods with understanding. Able to redirect patient to stop biting ETT and explained the need for wrist restraints for protection of ETT and HD catheter. Unable to aid patient in return of HR, BP, or RASS to baseline prior to arousal. Fentanyl bolus unsuccessful in aiding in RASS goal. PRN versed given and patient responded quickly with decreased HR, BP, and RASS goal. Will continue to monitor.

## 2018-09-19 NOTE — Progress Notes (Signed)
eLink Physician-Brief Progress Note Patient Name: [name] Rose DOB: 08-May-1950 MRN: [id number]   Date of Service  09/19/2018  HPI/Events of Note  Agitation - Request for bilateral soft wrist restraints.   eICU Interventions  Will order soft bilateral wrist restraints.      Intervention Category Major Interventions: Delirium, psychosis, severe agitation - evaluation and management  [name],[name] 09/19/2018, 2:45 AM

## 2018-09-19 NOTE — Progress Notes (Signed)
Inpatient Diabetes Program Recommendations  AACE/ADA: New Consensus Statement on Inpatient Glycemic Control (2015)  Target Ranges:  Prepandial:   less than 140 mg/dL      Peak postprandial:   less than 180 mg/dL (1-2 hours)      Critically ill patients:  140 - 180 mg/dL   Results for [name] Rose, [name] Rose (MRN [id number]) as of 09/19/2018 08:15  Ref. Range 09/18/2018 00:16 09/18/2018 03:40 09/18/2018 03:54 09/18/2018 07:41 09/18/2018 11:16 09/18/2018 16:07 09/18/2018 19:40  Glucose-Capillary Latest Ref Range: 70 - 99 mg/dL 175 (H)   204 (H)    209 (H) 237 (H) 259 (H) 282 (H) 250 (H)   Results for [name] Rose, [name] Rose (MRN [id number]) as of 09/19/2018 08:15  Ref. Range 09/18/2018 23:16 09/19/2018 03:32 09/19/2018 07:50  Glucose-Capillary Latest Ref Range: 70 - 99 mg/dL 224 (H) 228 (H) 217 (H)    Home DM Meds: None  Current Orders: Levemir 15 units BID       Novolog 1-2-3 units Q4H      Novolog 3 units Q4H      Remains on Solumedrol 40 mg BID.   Levemir increased yest (pt got total of 25 Levemir yest--will get total of 30 Levemir today).   Novolog Tube feed coverage started at 12pm yest.    MD- Please consider the following in-hospital insulin adjustments:  1. Increase Levemir to 18 units BID   2. Increase Novolog Tube Feed Coverage to: Novolog 5 units Q4 hours     --Will follow patient during hospitalization--  Wyn Quaker RN, MSN, CDE Diabetes Coordinator Inpatient Glycemic Control Team Team Pager: [phone] (8a-5p)

## 2018-09-19 NOTE — Progress Notes (Signed)
NAME:  [name] Rose, MRN:  [id number], DOB:  02/09/1951, LOS: 75 ADMISSION DATE:  09/08/2018, CONSULTATION DATE:  [id number] REFERRING MD:  Dr. Sloan Leiter, [facility], CHIEF COMPLAINT:  Short of breath   Brief History   68 y/o male former smoker presented to ER with weakness, hypoxia, fever 101.5.  He has hx of metastatic bladder cancer on targeted therapy with Padcev x 3 cycles.  CXR showed pulmonary infiltrates.  COVID testing negative x 2.  He had progressive hypoxia and PCCM asked to assess.  Past Medical History  HTN, HLD, Prostate cancer, PAF, CKD 3, MSSA Bacteremia March 2020, chemotherapy induced neutropenia, moderate mitral regurgitation  Significant Hospital Events   5/29 Admit 5/31 solumedrol started, heparin gtt started, transfuse PRBC 6/01 add bactrim for possible PCP 6/02 VDRF 6/04 d/c heparin gtt due to epistaxis 6/05 d/c ABx, transfuse PRBC 6/06 start to wean steroids 6/07 resume heparin gtt 6/08 renal fxn worse. Volume overloaded and hyperkalemic w/ worsening acid base  Consults:  Oncology 5/30 bladder cancer ID 5/31 atypical pneumonia  Procedures:  ETT 6/02 >>  Bronchoscopy 6/02 >> fungal elements on cytology, WBC 220 (76% N, 18% L, 6% M) L IJ TLC 6/2 >> R IJ HD 6/8 >>  Significant Diagnostic Tests:  Dopper legs b/l 5/31 >> no DVT CT chest 5/31 >> atherosclerosis, extensive GGO with inter and intralobular thickening, fatty liver   Micro Data:  COVID 5/29 >> negative COVID 5/29 >> negative Influenza 5/30 >> negative BAL 6/02 >> 5k Candida BAL AFB 6/02 >>   BAL Fungal 6/02 >> Yeast PCP 6/02 >> negative  Antimicrobials:  Vancomycin 5/28 >> 6/05 Cefepime 5/28 >> 6/05 Bactrim 6/01 >> 6/02  Interim history/subjective:  Afebrile.  I/O - 1.1L UOP in 24h, 210m + for last 24 hours / 16L + for admit.   Objective   Blood pressure (!) 92/58, pulse 63, temperature 97.8 F (36.6 C), temperature source Axillary, resp. rate (!) 30, height _0  (1.88 m), weight 131.7  kg, SpO2 94 %.    Vent Mode: PRVC FiO2 (%):  [30 %-40 %] 30 % Set Rate:  [30 bmp] 30 bmp Vt Set:  [570 mL] 570 mL PEEP:  [8 cmH20] 8 cmH20 Plateau Pressure:  [28 cmH20-34 cmH20] 30 cmH20   Intake/Output Summary (Last 24 hours) at 09/19/2018 06378Last data filed at 09/19/2018 0900 Gross per 24 hour  Intake 2825.65 ml  Output 3707 ml  Net -881.35 ml   Filed Weights   09/16/18 0500 09/18/18 0500 09/19/18 0441  Weight: 124.4 kg 128.7 kg 131.7 kg    Examination: General: critically ill appearing male lying in bed on vent, CVVHD in progress HEENT: MM pink/moist, ETT, old blood in right nares, pupils =/R Neuro: Awakens to voice, nods appropriately, attempts to move but has wrist restraints CV: s1s2 rrr, no m/r/g PULM: even/non-labored, lungs bilaterally clear anterior, diminished bases  GHY:IFOY non-tender, bsx4 active  Extremities: warm/dry, trace to 1+ edema  Skin: no rashes or lesions  Resolved Hospital Problem list     Assessment & Plan:   Acute hypoxemic respiratory failure groundglass opacities. P: PRVC as rest mode  Wean PEEP / FiO2 for sats > 90% Solumedrol 40 mg IV Q12 Volume removal per CVVHD > remains 16L+ positive for admit  Follow intermittent CXR  Follow up ABG in AM   AKI from hypoxia, bactrim   Hyperkalemia Non gap metabolic acidosis cont to worsen Chronic kidney disease  -baseline creatinine 1.80 from 07/31/18 P: CVVHD  per Nephrology, appreciate input  Discontinue bicarbonate infusion at 1830  Follow serial BMP  Replace electrolytes as indicated  Paroxysmal Atrial Fibrillation Bradycardia P: ICU monitoring  PRN IV Metoprolol  Hold scheduled metoprolol > likely brady due to combination of beta blocker + precedex / sedation Hold heparin with thrombocytopenia and epistaxis   Anemia from bleeding and in setting of malignancy and critical illness. Thrombocytopenia Epistaxis  P: Trend CBC Monitor for further bleeding  Transfuse per ICU guidelines    Metastatic bladder cancer on targeted therapy. P: Appreciate ONC input  Steroid induced hyperglycemia. P: SSI  Levemir 15 units V43  Acute metabolic encephalopathy. P: PAD protocol for RASS Goal of 0 to -1  Precedex + PRN versed / fentanyl   Best practice:  Diet: TF DVT prophylaxis: SCD's, heparin on hold due to epistaxis GI prophylaxis: PPI Mobility: Bed rest Code Status: full code Disposition: ICU Family:  Wife updated via phone 6/9 in detail.  CC Time: 30 minutes   Noe Gens, NP-C [facility] Pgr: [phone] or if no answer [phone] 09/19/2018, 9:39 AM

## 2018-09-20 ENCOUNTER — Inpatient Hospital Stay (HOSPITAL_COMMUNITY): Payer: PPO

## 2018-09-20 LAB — GLUCOSE, CAPILLARY
Glucose-Capillary: 120 mg/dL — ABNORMAL HIGH (ref 70–99)
Glucose-Capillary: 149 mg/dL — ABNORMAL HIGH (ref 70–99)
Glucose-Capillary: 150 mg/dL — ABNORMAL HIGH (ref 70–99)
Glucose-Capillary: 160 mg/dL — ABNORMAL HIGH (ref 70–99)
Glucose-Capillary: 69 mg/dL — ABNORMAL LOW (ref 70–99)
Glucose-Capillary: 76 mg/dL (ref 70–99)

## 2018-09-20 LAB — RENAL FUNCTION PANEL
Albumin: 2.6 g/dL — ABNORMAL LOW (ref 3.5–5.0)
Albumin: 2.7 g/dL — ABNORMAL LOW (ref 3.5–5.0)
Anion gap: 11 (ref 5–15)
Anion gap: 9 (ref 5–15)
BUN: 79 mg/dL — ABNORMAL HIGH (ref 8–23)
BUN: 69 mg/dL — ABNORMAL HIGH (ref 8–23)
CO2: 23 mmol/L (ref 22–32)
CO2: 25 mmol/L (ref 22–32)
Calcium: 8.1 mg/dL — ABNORMAL LOW (ref 8.9–10.3)
Calcium: 8.5 mg/dL — ABNORMAL LOW (ref 8.9–10.3)
Chloride: 104 mmol/L (ref 98–111)
Chloride: 105 mmol/L (ref 98–111)
Creatinine, Ser: 1.79 mg/dL — ABNORMAL HIGH (ref 0.61–1.24)
Creatinine, Ser: 1.66 mg/dL — ABNORMAL HIGH (ref 0.61–1.24)
GFR calc Af Amer: 44 mL/min — ABNORMAL LOW (ref >60)
GFR calc Af Amer: 49 mL/min — ABNORMAL LOW (ref >60)
GFR calc non Af Amer: 38 mL/min — ABNORMAL LOW (ref >60)
GFR calc non Af Amer: 42 mL/min — ABNORMAL LOW (ref >60)
Glucose, Bld: 188 mg/dL — ABNORMAL HIGH (ref 70–99)
Glucose, Bld: 90 mg/dL (ref 70–99)
Phosphorus: 4.3 mg/dL (ref 2.5–4.6)
Phosphorus: 4.1 mg/dL (ref 2.5–4.6)
Potassium: 5.2 mmol/L — ABNORMAL HIGH (ref 3.5–5.1)
Potassium: 5.3 mmol/L — ABNORMAL HIGH (ref 3.5–5.1)
Sodium: 138 mmol/L (ref 135–145)
Sodium: 139 mmol/L (ref 135–145)

## 2018-09-20 LAB — MAGNESIUM: Magnesium: 2.2 mg/dL (ref 1.7–2.4)

## 2018-09-20 LAB — CBC
HCT: 28.8 % — ABNORMAL LOW (ref 39.0–52.0)
Hemoglobin: 9.3 g/dL — ABNORMAL LOW (ref 13.0–17.0)
MCH: 32.1 pg (ref 26.0–34.0)
MCHC: 32.3 g/dL (ref 30.0–36.0)
MCV: 99.3 fL (ref 80.0–100.0)
Platelets: 76 10*3/uL — ABNORMAL LOW (ref 150–400)
RBC: 2.9 MIL/uL — ABNORMAL LOW (ref 4.22–5.81)
RDW: 18.5 % — ABNORMAL HIGH (ref 11.5–15.5)
WBC: 19.5 10*3/uL — ABNORMAL HIGH (ref 4.0–10.5)
nRBC: 3.5 % — ABNORMAL HIGH (ref 0.0–0.2)

## 2018-09-20 LAB — ACID FAST SMEAR (AFB, MYCOBACTERIA): Acid Fast Smear: NEGATIVE

## 2018-09-20 LAB — LACTATE DEHYDROGENASE: LDH: 619 U/L — ABNORMAL HIGH (ref 98–192)

## 2018-09-20 LAB — APTT: aPTT: 45 seconds — ABNORMAL HIGH (ref 24–36)

## 2018-09-20 MED ORDER — LABETALOL HCL 5 MG/ML IV SOLN
20.0000 mg | INTRAVENOUS | Status: DC | PRN
  Administered 2018-09-20 – 2018-09-22 (×12): 20 mg via INTRAVENOUS
  Filled 2018-09-20 (×11): qty 4

## 2018-09-20 MED ORDER — "THROMBI-PAD 3""X3"" EX PADS"
1.0000 | MEDICATED_PAD | Freq: Once | CUTANEOUS | Status: AC
  Administered 2018-09-20: 1 via TOPICAL
  Filled 2018-09-20: qty 1

## 2018-09-20 MED ORDER — THROMBIN 20000 UNITS EX KIT
20000.0000 [IU] | PACK | Freq: Once | CUTANEOUS | Status: AC
  Administered 2018-09-20: 20000 [IU] via TOPICAL
  Filled 2018-09-20: qty 1

## 2018-09-20 NOTE — Progress Notes (Signed)
NAME:  [name] Rose, MRN:  [id number], DOB:  1951-02-15, LOS: 73 ADMISSION DATE:  09/08/2018, CONSULTATION DATE:  [id number] REFERRING MD:  Dr. Sloan Leiter, [facility], CHIEF COMPLAINT:  Short of breath   Brief History   68 y/o male former smoker presented to ER with weakness, hypoxia, fever 101.5.  He has hx of metastatic bladder cancer on targeted therapy with Padcev x 3 cycles.  CXR showed pulmonary infiltrates.  COVID testing negative x 2.  He had progressive hypoxia and PCCM asked to assess.  Past Medical History  HTN, HLD, Prostate cancer, PAF, CKD 3, MSSA Bacteremia March 2020, chemotherapy induced neutropenia, moderate mitral regurgitation  Significant Hospital Events   5/29 Admit 5/31 solumedrol started, heparin gtt started, transfuse PRBC 6/01 add bactrim for possible PCP 6/02 VDRF 6/04 d/c heparin gtt due to epistaxis 6/05 d/c ABx, transfuse PRBC 6/06 start to wean steroids 6/07 resume heparin gtt 6/08 renal fxn worse. Volume overloaded and hyperkalemic w/ worsening acid base, started on CRRT 6/8 through 6/10: Making progress on volume status.  On 610 chest x-ray remarkably improved, still 14 L positive but removing with CRRT.  Tolerating spontaneous breathing trial working towards extubation  Consults:  Oncology 5/30 bladder cancer ID 5/31 atypical pneumonia  Procedures:  ETT 6/02 >>  Bronchoscopy 6/02 >> fungal elements on cytology, WBC 220 (76% N, 18% L, 6% M) L IJ TLC 6/2 >> R IJ HD 6/8 >>  Significant Diagnostic Tests:  Dopper legs b/l 5/31 >> no DVT CT chest 5/31 >> atherosclerosis, extensive GGO with inter and intralobular thickening, fatty liver   Micro Data:  COVID 5/29 >> negative COVID 5/29 >> negative Influenza 5/30 >> negative BAL 6/02 >> 5k Candida BAL AFB 6/02 >>   BAL Fungal 6/02 >> Yeast PCP 6/02 >> negative  Antimicrobials:  Vancomycin 5/28 >> 6/05 Cefepime 5/28 >> 6/05 Bactrim 6/01 >> 6/02  Interim history/subjective:    Afebrile but is on  CRRT, CBC pending for this morning.  Looks quite comfortable on pressure support ventilation Objective   Blood pressure (Abnormal) 150/104, pulse 80, temperature (Abnormal) 97.3 F (36.3 C), temperature source Axillary, resp. rate (Abnormal) 30, height _0  (1.88 m), weight 128.3 kg, SpO2 95 %.    Vent Mode: CPAP;PSV FiO2 (%):  [30 %] 30 % Set Rate:  [30 bmp] 30 bmp Vt Set:  [570 mL] 570 mL PEEP:  [5 cmH20-8 cmH20] 5 cmH20 Pressure Support:  [5 cmH20] 5 cmH20 Plateau Pressure:  [28 cmH20-30 cmH20] 28 cmH20   Intake/Output Summary (Last 24 hours) at 09/20/2018 0815 Last data filed at 09/20/2018 0800 Gross per 24 hour  Intake 2752.93 ml  Output 4220 ml  Net -1467.07 ml   Filed Weights   09/18/18 0500 09/19/18 0441 09/20/18 0300  Weight: 128.7 kg 131.7 kg 128.3 kg    Examination: General is a 68 year old white male he is resting comfortably in bed and looks quite comfortable on pressure support ventilation HEENT normocephalic atraumatic he does continue to have scleral edema pupils are equal and reactive mucous membranes are moist he is orally intubated Pulmonary: Some scattered rhonchi, excellent tidal volume exceeding 1 L on pressure support of 5 and PEEP of 5. Cardiac: Irregular irregular with bursts of SVT, but then at times heart rate down into the 40s no murmur rub or gallop appreciated Abdomen: Soft, not tender no organomegaly Extremities: Warm and dry brisk capillary refill diffuse anasarca Neuro: Awake, interactive, communicative.  Moves all extremities, no focal motor deficit is  appreciated  Resolved Hospital Problem list   Epistaxis Non-anion gap metabolic acidosis Assessment & Plan:   Acute hypoxemic respiratory failure groundglass opacities. Portable chest x-ray personally reviewed:The endotracheal tube is in satisfactory position as is the port and dialysis catheter.  He has cardiomegaly.  Comparing films from 6/9 there is significant improvement in bilateral  aeration, with resolving pulmonary edema He is currently on spontaneous breathing trial with excellent frequency tidal volume ratio Plan discontinue fentanyl  Continuing spontaneous breathing trial with assessment for extubation Cont to push volume removal (I have spoken w/ nephrology about this) Cont VAP bundle  Continue current Solu-Medrol dosing  AKI from hypoxia, and presumptively medication related ? Bactrim  Hyperkalemia Chronic kidney disease  -baseline creatinine 1.80 from 07/31/18; has now been on CRRT for 48 hours\ -He is still 14 L positive, I think we can be more aggressive here Plan Would like to continue to push aggressive volume removal via CRRT for at least another 24 hours Serial chemistries Renal dose medications  Paroxysmal Atrial Fibrillation Bradycardia When he is calm suspect bradycardia is secondary to Precedex and beta-blockade, however when he is awake and restless he developed rapid ventricular response Plan continuing telemetry monitoring Urine beta-blockade Unfortunately we are holding heparin due to thrombocytopenia and epistaxis  Hypertension Plan Ongoing volume removal with CRRT PRN labetalol   Anemia from bleeding and in setting of malignancy and critical illness. Thrombocytopenia Awaiting morning CBC, platelet had been stabilizing.  He is oozing from his central line site Plan Trend CBC Transfuse for hemoglobin less than 7  Metastatic bladder cancer on targeted therapy. Appreciate oncology follow-up, oncology noting very little options as far as additional treatment however they feel he is likely in remission Plan Outpatient follow-up after discharge  Steroid induced hyperglycemia. Plan  continuing Levemir 15 units every 12 hours as well as current sliding scale insulin   Acute metabolic encephalopathy. Plan Discontinue fentanyl Titrate Precedex for RASS goal 0, I suspect will build to discontinue this after he is extubated  Supportive care  Best practice:  Diet: TF DVT prophylaxis: SCD's, heparin on hold due to epistaxis GI prophylaxis: PPI Mobility: Bed rest Code Status: full code Disposition: ICU, working towards extubation this morning.  Continue to push volume removal no change in steroids Family:  Wife to be updated later this morning  CC Time: 30 min  Erick Colace ACNP-BC [place] Pager # [phone] OR # [phone] if no answer

## 2018-09-20 NOTE — Progress Notes (Signed)
Patient awakens with significant agitation, pulling arms and shoulders repeatedly with great strength against wrist restraints. Patient has been reoriented with each event and nods with understanding, but continues to pull.  With each event, the HD catheter bleeds out of the thrombipad and dressing. Dressing has been changed and reinforced. Will continue to monitor.

## 2018-09-20 NOTE — Procedures (Signed)
Extubation Procedure Note  Patient Details:   Name: [name] Rose DOB: [birth year] MRN: [id number]   Airway Documentation:    Vent end date: 09/20/18 Vent end time: 1010   Evaluation  O2 sats: stable throughout Complications: No apparent complications Patient did tolerate procedure well. Bilateral Breath Sounds: Clear, Diminished   Yes  Baldwin [place] Nannette 09/20/2018, 10:10 AM  Positive cuff leak pre extubation. Placed on 2 lpm [place] post extubation- had to increase to 4 lpm - current sp02 95%.

## 2018-09-20 NOTE — Progress Notes (Signed)
[name] is a little bit awake this morning.  I think he knows that I am there and he recognizes me.  He shakes his head to yes or no type questions.  He clearly has atrial fibrillation that when I was interacting with him got up to 130 bpm.  His blood pressure is also quite high at 191/105.  He is doing the dialysis.  He is on CVVH.  His BUN and creatinine are coming down.  There is no CBC back yet today.  He is still on tube feeds and getting nutrition that way.  He has been afebrile.  Hopefully, he is "turning the corner."  I have noted he still has a long way to go.  When he recovers from all this, I sincerely doubt that we will be able to treat him.  Again, I just cannot find anything with the Padcev regarding respiratory failure.  There is a very very low risk of renal failure.  I do think it would be very risky to try this again.  I know that our options are very limited for him with respect to treatment.  Right now, he is in remission.  How long he stays in remission is unclear.  His quality of life is definitely the priority at this point.  As always, the ICU staff is doing a fantastic job with him.  [name], MD  Psalm 4:8

## 2018-09-20 NOTE — Progress Notes (Signed)
1400 labs to not be done due to late lab draw this morning at 1121 per Dr. Jonnie Finner.  Pt. Will have morning labs as scheduled.

## 2018-09-20 NOTE — Progress Notes (Signed)
[facility] Progress Note  Subjective: -1.4 L net yest, labs improving, pt more alert today. BP's better.   Vitals:   09/20/18 1023 09/20/18 1100 09/20/18 1144 09/20/18 1200  BP: (!) 168/113 (!) 168/119 (!) 162/99   Pulse:  (!) 117    Resp: (!) 27  (!) 26   Temp:    (!) 96.8 F (36 C)  TempSrc:    Axillary  SpO2:  97%    Weight:      Height:        Inpatient medications: . chlorhexidine gluconate (MEDLINE KIT)  15 mL Mouth Rinse BID  . Chlorhexidine Gluconate Cloth  6 each Topical Daily  . diltiazem  30 mg Per Tube Q8H  . gabapentin  300 mg Per Tube BID  . insulin aspart  1-3 Units Subcutaneous Q4H  . insulin aspart  3 Units Subcutaneous Q4H  . insulin detemir  15 Units Subcutaneous Q12H  . mouth rinse  15 mL Mouth Rinse 10 times per day  . methylPREDNISolone (SOLU-MEDROL) injection  40 mg Intravenous Q12H  . pantoprazole sodium  40 mg Per Tube Q24H  . vitamin B-6  300 mg Per Tube Daily   .  prismasol BGK 4/2.5 500 mL/hr at 09/19/18 1627  .  prismasol BGK 4/2.5 300 mL/hr at 09/20/18 0929  . dextrose    . heparin 10,000 units/ 20 mL infusion syringe 500 Units/hr (09/20/18 1200)  . prismasol BGK 4/2.5 2,000 mL/hr at 09/20/18 0846   acetaminophen **OR** acetaminophen, albuterol, alteplase, bisacodyl, dextrose, heparin, heparin, heparin, labetalol, metoprolol tartrate, [DISCONTINUED] ondansetron **OR** ondansetron (ZOFRAN) IV, sennosides, sodium chloride flush    Exam: Gen intubated and sedated, eyes open Sclera anicteric, throat w ETT  No jvd or bruits Chest clear bilat ant and lat RRR no MRG, tachy Abd soft ntnd no mass or ascites +bs very obese GU normal male w/ foley cath in  MS no joint effusions or deformity Ext diffuse 2+ LE edema and +UE edema Neuro is sedated on the vent    Home meds:  - diltiazem 240 qd/ losartan 100 qd/ metoprolol 25 bid  - gabapentin 300 tid/ lorazepam 0.28m qid prn/ prochlorperazine 10 qid prn   - prn's/ vitamins/  supplements   Na 140  K 5.7  BUN 197  Cr 4.5  CO2 18  Ca 8.1  WBC 7k  Hb 7.7  plt 48  UA 11-20 wbc/ > 50 rbc/ rare bact, 0-5 epis, neg protein, 1.011, 5.0   UNa 50  Ucreat 45.7  Renal UKorea6/8 > Negative for hydronephrosis. No acute abnormality.  Fatty infiltration a of the liver and small volume perihepatic ascites.  CXR > 6/8  IMPRESSION: Interval placement of right Vas-Cath with the tip in the SVC. No pneumothorax. Remainder the support devices are stable. Mild cardiomegaly, vascular congestion.  Interstitial and airspace opacities slightly improved.  Assessment: 1. Acute renal failure - gradual AKI over 8-9 days in hospital, some hypotension early in admission, due to shock /ATN vs other. CRRT started on 6/8.  D#3.  Improving volume removal, will ^ further 200- 300 cc/ hr UF.  Still sig edema/ vol overload 2. Vent dep resp failure/ pulm infiltrates - sp IV abx thru 6/5 3. Vol overload - up 12kg by wts today, improved a little 4. Bladder cancer stage IV - oncology seeing 5. HTN - BP's more stable to high 6. Atrial fib - heart rates were low so dilt po was dc'd, better today 7. Anemia  multifact      [facility] 09/20/2018, 12:43 PM  Iron/TIBC/Ferritin/ %Sat    Component Value Date/Time   IRON 88 07/24/2018 1230   TIBC 240 07/24/2018 1230   FERRITIN 3,307 (H) 09/08/2018 1047   IRONPCTSAT 37 07/24/2018 1230   Recent Labs  Lab 09/20/18 1121  NA 138  K 5.2*  CL 104  CO2 23  GLUCOSE 188*  BUN 79*  CREATININE 1.79*  CALCIUM 8.1*  PHOS 4.3  ALBUMIN 2.6*   No results for input(s): AST, ALT, ALKPHOS, BILITOT, PROT in the last 168 hours. Recent Labs  Lab 09/20/18 1121  WBC 19.5*  HGB 9.3*  HCT 28.8*  PLT 76*

## 2018-09-20 NOTE — Consult Note (Signed)
   [facility] CM Inpatient Consult   09/20/2018  JAMMIE CLINK May 13, 1950 [id number]   Patient screened for potential [facility] Care Management services due to unplanned readmission risk score of 52%, extreme.   Chart review reveals patient currently in ICU. Will continue to follow for progression and disposition plans.  Netta Cedars, MSN, [place] [facility] Liaison Nurse Mobile Phone [phone]  Toll free office [phone]

## 2018-09-21 LAB — RENAL FUNCTION PANEL
Albumin: 2.9 g/dL — ABNORMAL LOW (ref 3.5–5.0)
Albumin: 2.8 g/dL — ABNORMAL LOW (ref 3.5–5.0)
Anion gap: 9 (ref 5–15)
Anion gap: 7 (ref 5–15)
BUN: 59 mg/dL — ABNORMAL HIGH (ref 8–23)
BUN: 49 mg/dL — ABNORMAL HIGH (ref 8–23)
CO2: 25 mmol/L (ref 22–32)
CO2: 26 mmol/L (ref 22–32)
Calcium: 8.4 mg/dL — ABNORMAL LOW (ref 8.9–10.3)
Calcium: 8.1 mg/dL — ABNORMAL LOW (ref 8.9–10.3)
Chloride: 104 mmol/L (ref 98–111)
Chloride: 105 mmol/L (ref 98–111)
Creatinine, Ser: 1.62 mg/dL — ABNORMAL HIGH (ref 0.61–1.24)
Creatinine, Ser: 1.44 mg/dL — ABNORMAL HIGH (ref 0.61–1.24)
GFR calc Af Amer: 50 mL/min — ABNORMAL LOW (ref >60)
GFR calc Af Amer: 58 mL/min — ABNORMAL LOW (ref >60)
GFR calc non Af Amer: 43 mL/min — ABNORMAL LOW (ref >60)
GFR calc non Af Amer: 50 mL/min — ABNORMAL LOW (ref >60)
Glucose, Bld: 107 mg/dL — ABNORMAL HIGH (ref 70–99)
Glucose, Bld: 122 mg/dL — ABNORMAL HIGH (ref 70–99)
Phosphorus: 4.2 mg/dL (ref 2.5–4.6)
Phosphorus: 4.7 mg/dL — ABNORMAL HIGH (ref 2.5–4.6)
Potassium: 5.3 mmol/L — ABNORMAL HIGH (ref 3.5–5.1)
Potassium: 4.8 mmol/L (ref 3.5–5.1)
Sodium: 138 mmol/L (ref 135–145)
Sodium: 138 mmol/L (ref 135–145)

## 2018-09-21 LAB — GLUCOSE, CAPILLARY
Glucose-Capillary: 83 mg/dL (ref 70–99)
Glucose-Capillary: 87 mg/dL (ref 70–99)
Glucose-Capillary: 116 mg/dL — ABNORMAL HIGH (ref 70–99)
Glucose-Capillary: 108 mg/dL — ABNORMAL HIGH (ref 70–99)
Glucose-Capillary: 111 mg/dL — ABNORMAL HIGH (ref 70–99)
Glucose-Capillary: 127 mg/dL — ABNORMAL HIGH (ref 70–99)

## 2018-09-21 LAB — MAGNESIUM: Magnesium: 2.4 mg/dL (ref 1.7–2.4)

## 2018-09-21 LAB — LACTATE DEHYDROGENASE: LDH: 715 U/L — ABNORMAL HIGH (ref 98–192)

## 2018-09-21 LAB — APTT: aPTT: 25 seconds (ref 24–36)

## 2018-09-21 MED ORDER — AMLODIPINE BESYLATE 5 MG PO TABS
5.0000 mg | ORAL_TABLET | Freq: Two times a day (BID) | ORAL | Status: DC
  Administered 2018-09-21 – 2018-09-23 (×4): 5 mg via ORAL
  Filled 2018-09-21 (×4): qty 1

## 2018-09-21 MED ORDER — PRISMASOL BGK 0/2.5 32-2.5 MEQ/L IV SOLN
INTRAVENOUS | Status: DC
  Administered 2018-09-21 – 2018-09-22 (×7): via INTRAVENOUS_CENTRAL
  Filled 2018-09-21 (×32): qty 5000

## 2018-09-21 MED ORDER — CLONIDINE HCL 0.2 MG/24HR TD PTWK
0.2000 mg | MEDICATED_PATCH | TRANSDERMAL | Status: DC
  Administered 2018-09-21: 0.2 mg via TRANSDERMAL
  Filled 2018-09-21: qty 1

## 2018-09-21 MED ORDER — DILTIAZEM HCL 30 MG PO TABS
30.0000 mg | ORAL_TABLET | Freq: Three times a day (TID) | ORAL | Status: DC
  Administered 2018-09-21: 30 mg via ORAL
  Filled 2018-09-21: qty 1

## 2018-09-21 NOTE — Progress Notes (Signed)
NAME:  [name] Rose, MRN:  [id number], DOB:  09/28/1950, LOS: 80 ADMISSION DATE:  09/08/2018, CONSULTATION DATE:  [id number] REFERRING MD:  Dr. Sloan Leiter, [facility], CHIEF COMPLAINT:  Short of breath   Brief History   68 y/o male former smoker presented to ER with weakness, hypoxia, fever 101.5.  He has hx of metastatic bladder cancer on targeted therapy with Padcev x 3 cycles.  CXR showed pulmonary infiltrates.  COVID testing negative x 2.  He had progressive hypoxia and PCCM asked to assess. Extubated 09/20/2018  Past Medical History  HTN, HLD, Prostate cancer, PAF, CKD 3, MSSA Bacteremia March 2020, chemotherapy induced neutropenia, moderate mitral regurgitation  Significant Hospital Events   5/29 Admit 5/31 solumedrol started, heparin gtt started, transfuse PRBC 6/01 add bactrim for possible PCP 6/02 VDRF 6/04 d/c heparin gtt due to epistaxis 6/05 d/c ABx, transfuse PRBC 6/06 start to wean steroids 6/07 resume heparin gtt 6/08 renal fxn worse. Volume overloaded and hyperkalemic w/ worsening acid base, started on CRRT 6/8 through 6/10: Making progress on volume status.  On 610 chest x-ray remarkably improved, still 14 L positive but removing with CRRT.  Tolerating spontaneous breathing trial working towards extubation 6/10 extubated  Consults:  Oncology 5/30 bladder cancer ID 5/31 atypical pneumonia  Procedures:  ETT 6/02 >>  Bronchoscopy 6/02 >> fungal elements on cytology, WBC 220 (76% N, 18% L, 6% M) L IJ TLC 6/2 >> R IJ HD 6/8 >>  Significant Diagnostic Tests:  Dopper legs b/l 5/31 >> no DVT CT chest 5/31 >> atherosclerosis, extensive GGO with inter and intralobular thickening, fatty liver    Micro Data:  COVID 5/29 >> negative COVID 5/29 >> negative Influenza 5/30 >> negative BAL 6/02 >> 5k Candida BAL AFB 6/02 >>   BAL Fungal 6/02 >> Yeast PCP 6/02 >> negative  Antimicrobials:  Vancomycin 5/28 >> 6/05 Cefepime 5/28 >> 6/05 Bactrim 6/01 >> 6/02  Interim  history/subjective:    On CRRT Comfortable Tolerating being off vent with no significant complaints this morning  Objective   Blood pressure (!) 172/73, pulse 85, temperature 98 F (36.7 C), temperature source Oral, resp. rate (!) 30, height _0  (1.88 m), weight 120.8 kg, SpO2 99 %.        Intake/Output Summary (Last 24 hours) at 09/21/2018 0838 Last data filed at 09/21/2018 0800 Gross per 24 hour  Intake 661.9 ml  Output 6651 ml  Net -5989.1 ml   Filed Weights   09/19/18 0441 09/20/18 0300 09/21/18 0407  Weight: 131.7 kg 128.3 kg 120.8 kg    Examination: Elderly gentleman, comfortable on nasal cannula Moist oral mucosa Scattered rhonchi, rales at the bases Irregular irregular pulse, no murmur Bowel sounds appreciated, abdomen is soft, nontender Extremities is warm and dry, edema  Neurologically-awake, alert, interacting appropriately, no focal deficits  Chest x-ray 09/20/2018-reviewed by myself showing significant improvement   Resolved Hospital Problem list   Epistaxis Non-anion gap metabolic acidosis  Assessment & Plan:    Acute hypoxemic respiratory failure -Successfully extubated 09/20/2018 -Continue Solu-Medrol-we will plan to switch to prednisone -PRN bronchodilators  Acute kidney injury on chronic kidney disease -On CRRT-fluid been removed -Trend electrolytes -Renal dose medications  Paroxysmal atrial fibrillation Bradycardia -Anticoagulation held secondary to significant trauma cytopenia and epistaxis -Continue beta-blockade  Hypertension -Monitor closely -PRN labetalol  Anemia -Related to critical illness -Trend CBC -Transfuse hemoglobin less than 7  Metastatic bladder cancer on targeted therapy -Outpatient follow-up after discharge  Steroid-induced hyperglycemia -Continue Levemir -Will adjust  as steroids doses decrease  Acute metabolic encephalopathy -Monitor closely -Appears to be improving  Speech evaluation for swallowing   Best practice:  Diet: Continue tube feed DVT prophylaxis: Continue SCDs, heparin on hold due to epistaxis GI prophylaxis: Continue PPI Mobility: Bed rest Code Status: full code Disposition: ICU Family:  Wife to be updated later this morning  The patient is critically ill with multiple organ system failure and requires high complexity decision making for assessment and support, frequent evaluation and titration of therapies, advanced monitoring, review of radiographic studies and interpretation of complex data.    Critical Care Time devoted to patient care services, exclusive of separately billable procedures, described in this note is 30 minutes.  Sherrilyn Rist, MD [place], PCCM Cell: [phone]

## 2018-09-21 NOTE — Progress Notes (Signed)
Pt's SBP maintaining >200 despite PRN medication administrations.  Cuff switched between upper arms and wrists, with no improvement.  Cuff then switched to lower extremities with improvement in SBP (120's-130's).  Ander Slade, MD made aware of the findings.  Will continue to monitor.

## 2018-09-21 NOTE — Evaluation (Signed)
SLP Cancellation Note  Patient Details Name: [name] Rose MRN: [id number] DOB: December 15, 1950   Cancelled treatment:       Reason Eval/Treat Not Completed: Other (comment);Medical issues which prohibited therapy Received order for swallow evaluation.  Note pt also with BP and HR issues this afternoon.  Given prolonged length of intubation and current vital parameters, SLP will follow up tomorrow am.  Thanks.   Luanna Salk, MS [facility] SLP Acute Rehab Services Pager [phone] Office [phone]   Macario Golds 09/21/2018, 4:18 PM

## 2018-09-21 NOTE — Progress Notes (Signed)
[facility] Progress Note  Subjective: excellent UF yest , BP's high this am.  Extubated yest  Vitals:   09/21/18 0720 09/21/18 0800 09/21/18 0900 09/21/18 1000  BP: (!) 147/96 (!) 172/73 (!) 154/95 (!) 149/79  Pulse:  85 85 91  Resp:  (!) 30 (!) 27 (!) 28  Temp:  98.6 F (37 C)    TempSrc:  Oral    SpO2:  99% 100% 96%  Weight:      Height:        Inpatient medications: . chlorhexidine gluconate (MEDLINE KIT)  15 mL Mouth Rinse BID  . Chlorhexidine Gluconate Cloth  6 each Topical Daily  . diltiazem  30 mg Per Tube Q8H  . gabapentin  300 mg Per Tube BID  . insulin aspart  1-3 Units Subcutaneous Q4H  . insulin aspart  3 Units Subcutaneous Q4H  . insulin detemir  15 Units Subcutaneous Q12H  . mouth rinse  15 mL Mouth Rinse 10 times per day  . methylPREDNISolone (SOLU-MEDROL) injection  40 mg Intravenous Q12H  . pantoprazole sodium  40 mg Per Tube Q24H  . vitamin B-6  300 mg Per Tube Daily   .  prismasol BGK 4/2.5 500 mL/hr at 09/20/18 1656  .  prismasol BGK 4/2.5 300 mL/hr at 09/20/18 1656  . dextrose 40 mL/hr at 09/21/18 1000  . prismasol BGK 4/2.5 2,000 mL/hr at 09/21/18 1041   acetaminophen **OR** acetaminophen, albuterol, alteplase, bisacodyl, dextrose, heparin, heparin, heparin, labetalol, metoprolol tartrate, [DISCONTINUED] ondansetron **OR** ondansetron (ZOFRAN) IV, sennosides, sodium chloride flush    Exam: Gen on HFNC, not in distress, lethargic Sclera anicteric, throat w ETT  No jvd or bruits Chest clear bilat ant and lat RRR no MRG, tachy Abd soft ntnd no mass or ascites +bs very obese GU normal male w/ foley cath in  MS no joint effusions or deformity Ext diffuse 1-2+ LE and UE edema Neuro follows commands    Home meds:  - diltiazem 240 qd/ losartan 100 qd/ metoprolol 25 bid  - gabapentin 300 tid/ lorazepam 0.38m qid prn/ prochlorperazine 10 qid prn   - prn's/ vitamins/ supplements   Na 140  K 5.7  BUN 197  Cr 4.5  CO2 18  Ca 8.1   WBC 7k  Hb 7.7  plt 48  UA 11-20 wbc/ > 50 rbc/ rare bact, 0-5 epis, neg protein, 1.011, 5.0   UNa 50  Ucreat 45.7  Renal UKorea6/8 > Negative for hydronephrosis. No acute abnormality.  Fatty infiltration a of the liver and small volume perihepatic ascites.  CXR > 6/8  IMPRESSION: Interval placement of right Vas-Cath with the tip in the SVC. No pneumothorax. Remainder the support devices are stable. Mild cardiomegaly, vascular congestion.  Interstitial and airspace opacities slightly improved.  Assessment: 1. Acute renal failure - shock /ATN vs other. CRRT started on 6/8.  D#4.  Off pressors and removing lots of excess volume. Cont another 24- 48 hrs. 2. Vent dep resp failure/ pulm infiltrates - sp IV abx thru 6/5 3. Vol overload - wt's and vol improving, now only up 7kg 4. Bladder cancer stage IV - f/b ONC 5. HTN - BP's ^^ high, start norvasc per NG and topical clonidine 6. Atrial fib - on po dilt 30 tid, BB stopped due to bradycardia 6/9 7. Anemia multifact  8. Hyperkalemia - will change dialysate 4K > 2K  [facility] 09/21/2018, 10:48 AM  Iron/TIBC/Ferritin/ %Sat    Component Value  Date/Time   IRON 88 07/24/2018 1230   TIBC 240 07/24/2018 1230   FERRITIN 3,307 (H) 09/08/2018 1047   IRONPCTSAT 37 07/24/2018 1230   Recent Labs  Lab 09/21/18 0306  NA 138  K 5.3*  CL 104  CO2 25  GLUCOSE 107*  BUN 59*  CREATININE 1.62*  CALCIUM 8.4*  PHOS 4.2  ALBUMIN 2.9*   No results for input(s): AST, ALT, ALKPHOS, BILITOT, PROT in the last 168 hours. Recent Labs  Lab 09/20/18 1121  WBC 19.5*  HGB 9.3*  HCT 28.8*  PLT 76*

## 2018-09-21 NOTE — Progress Notes (Signed)
Verified with pharmacy about the new dialysate bags; was made aware that the appropriate/ordered level of Potassium was added to the new bags of Dialysate fluid.

## 2018-09-21 NOTE — Progress Notes (Signed)
Pt's HR maintaining 150's-160's on the monitor.  12-lead EKG obtained, and confirmed SVT.  Ander Slade, MD made aware of rhythm change.  PRN Metoprolol administered, HR decreased to 120's, and then to ST in the low 100's.    Will continue to monitor.

## 2018-09-21 NOTE — Progress Notes (Signed)
It is fantastic that [name] is now off the ventilator.  I am incredibly impressed by this fact.  He is still on the CVVH.  However, his BUN and creatinine have improved markedly.  His BUN now is 59 and creatinine 1.62.  He is out of atrial fibrillation.  He is in sinus tachycardia.  His blood pressure is on the high side.  He is not having anything orally right now.  There is some nausea.  He is alert.  He is talking to me.  I was telling him that there was just no way that we would be able to treat him for the bladder cancer right now.  I am not sure if we ever would be able to treat him for the bladder cancer.  I still am not sure as to why he decompensated so drastically.  I do not think it is the Padcev but I cannot be 100% sure about this.  I really cannot find anything regarding Padcev with respiratory failure.  There is a 3% incidence of renal failure.  Hopefully, he will stay in a remission for a while.  There is no CBC back yet today.  His platelet count is coming up slowly.  Hopefully this is a reflection of what ever infection or "sepsis" that he had is now resolving.  I know that we still have a long way to go with him.  I suspect he probably will still be in the hospital for another week or so.  He has not been out of bed for almost 2 weeks.  I am not sure he will need a lot of physical therapy.  He may need some speech pathology to help with swallowing.  No doubt, his improvement is a clear reflection of the incredible care that he is gotten from the ICU staff.  Everybody in the ICU has worked so hard to get him to where he is now.  Their compassion and professionalism is obvious.  I am not sure why the LDH is trending upward.  I do not think this is reflective of any hemolysis.  His liver I think is doing okay.  He has not had LFTs for a while so we probably should check this.  [name], MD  Psalm 140:6

## 2018-09-21 NOTE — Progress Notes (Addendum)
Nutrition Follow-up  DOCUMENTATION CODES:   Obesity unspecified  INTERVENTION:  - diet advancement as medically feasible.  - if unable to advance diet within the next 24-48 hours, recommend placement of small bore NGT and re-start TF.    NUTRITION DIAGNOSIS:   Inadequate oral intake related to inability to eat as evidenced by NPO status. -ongoing  GOAL:   Patient will meet greater than or equal to 90% of their needs -unmet/unable to meet at this time.   MONITOR:   Diet advancement, Labs, Weight trends, I & O's  ASSESSMENT:   68 y.o. male with medical history significant of hypertension, hyperlipidemia, prediabetes, prostate cancer, metastasized bladder cancer on chemotherapy, PAF, CKD stage III, MSSA bacteremia.  Patient presented secondary to significant weakness overnight. Admitted for pneumonia.  Weight +7.4 kg/16 lb compared to admission (5/29) weight. Used admission weight of 113.4 kg to re-estimate nutrition needs as RN flow sheet indicates patient has deep to very deep pitting edema to all extremities. Patient was extubated and OGT removed yesterday at ~10:10 AM. Patient remains NPO since that time.   Per notes: AKI on CKD started on CRRT 6/8; afib; anemia of critical illness; metastatic bladder cancer on targeted therapy with plan for outpatient follow-up; steroid-induced hyperglycemia; acute metabolic encephalopathy; pending SLP evaluation for swallowing. Nephrology and Oncology following.    Medications reviewed; sliding scale novolog, 15 units levemir BID, 40 mg solu-medrol BID, 300 mg vitamin B6/day. Labs reviewed; CBGs: 83 and 87 mg/dl today, K: 5.3 mmol/l, BUN: 59 mg/dl, creatinine: 1.62 mg/dl, Ca: 8.4 mg/dl, GFR: 43 ml/min.      Diet Order:   Diet Order            Diet NPO time specified  Diet effective now              EDUCATION NEEDS:   Not appropriate for education at this time  Skin:  Skin Assessment: Reviewed RN Assessment  Last BM:   6/10  Height:   Ht Readings from Last 1 Encounters:  09/13/18 $RemoveB'6\' 2"'DWQbcfWe$  (1.88 m)    Weight:   Wt Readings from Last 1 Encounters:  09/21/18 120.8 kg    Ideal Body Weight:  86.3 kg  BMI:  Body mass index is 34.19 kg/m.  Estimated Nutritional Needs:   Kcal:  2380-2610 kcal  Protein:  >/= 175 grams  Fluid:  >/= 2.2 L/day     [name] Matin, MS, RD, LDN, [facility] Inpatient Clinical Dietitian Pager # [phone] After hours/weekend pager # [phone]

## 2018-09-22 ENCOUNTER — Inpatient Hospital Stay (HOSPITAL_COMMUNITY): Payer: PPO

## 2018-09-22 DIAGNOSIS — Z992 Dependence on renal dialysis: Secondary | ICD-10-CM

## 2018-09-22 LAB — COMPREHENSIVE METABOLIC PANEL
ALT: 71 U/L — ABNORMAL HIGH (ref 0–44)
AST: 72 U/L — ABNORMAL HIGH (ref 15–41)
Albumin: 2.9 g/dL — ABNORMAL LOW (ref 3.5–5.0)
Alkaline Phosphatase: 55 U/L (ref 38–126)
Anion gap: 8 (ref 5–15)
BUN: 45 mg/dL — ABNORMAL HIGH (ref 8–23)
CO2: 27 mmol/L (ref 22–32)
Calcium: 8.4 mg/dL — ABNORMAL LOW (ref 8.9–10.3)
Chloride: 102 mmol/L (ref 98–111)
Creatinine, Ser: 1.55 mg/dL — ABNORMAL HIGH (ref 0.61–1.24)
GFR calc Af Amer: 53 mL/min — ABNORMAL LOW (ref >60)
GFR calc non Af Amer: 46 mL/min — ABNORMAL LOW (ref >60)
Glucose, Bld: 138 mg/dL — ABNORMAL HIGH (ref 70–99)
Potassium: 4.5 mmol/L (ref 3.5–5.1)
Sodium: 137 mmol/L (ref 135–145)
Total Bilirubin: 0.8 mg/dL (ref 0.3–1.2)
Total Protein: 4.9 g/dL — ABNORMAL LOW (ref 6.5–8.1)

## 2018-09-22 LAB — LACTATE DEHYDROGENASE: LDH: 625 U/L — ABNORMAL HIGH (ref 98–192)

## 2018-09-22 LAB — CBC WITH DIFFERENTIAL/PLATELET
Abs Immature Granulocytes: 0.32 10*3/uL — ABNORMAL HIGH (ref 0.00–0.07)
Basophils Absolute: 0 10*3/uL (ref 0.0–0.1)
Basophils Relative: 0 %
Eosinophils Absolute: 0 10*3/uL (ref 0.0–0.5)
Eosinophils Relative: 0 %
HCT: 25.1 % — ABNORMAL LOW (ref 39.0–52.0)
Hemoglobin: 7.8 g/dL — ABNORMAL LOW (ref 13.0–17.0)
Immature Granulocytes: 3 %
Lymphocytes Relative: 5 %
Lymphs Abs: 0.6 10*3/uL — ABNORMAL LOW (ref 0.7–4.0)
MCH: 31.3 pg (ref 26.0–34.0)
MCHC: 31.1 g/dL (ref 30.0–36.0)
MCV: 100.8 fL — ABNORMAL HIGH (ref 80.0–100.0)
Monocytes Absolute: 1 10*3/uL (ref 0.1–1.0)
Monocytes Relative: 7 %
Neutro Abs: 10.9 10*3/uL — ABNORMAL HIGH (ref 1.7–7.7)
Neutrophils Relative %: 85 %
Platelets: 50 10*3/uL — ABNORMAL LOW (ref 150–400)
RBC: 2.49 MIL/uL — ABNORMAL LOW (ref 4.22–5.81)
RDW: 20.2 % — ABNORMAL HIGH (ref 11.5–15.5)
WBC: 12.8 10*3/uL — ABNORMAL HIGH (ref 4.0–10.5)
nRBC: 3.4 % — ABNORMAL HIGH (ref 0.0–0.2)

## 2018-09-22 LAB — RENAL FUNCTION PANEL
Albumin: 2.8 g/dL — ABNORMAL LOW (ref 3.5–5.0)
Anion gap: 10 (ref 5–15)
BUN: 40 mg/dL — ABNORMAL HIGH (ref 8–23)
CO2: 25 mmol/L (ref 22–32)
Calcium: 8.3 mg/dL — ABNORMAL LOW (ref 8.9–10.3)
Chloride: 101 mmol/L (ref 98–111)
Creatinine, Ser: 1.77 mg/dL — ABNORMAL HIGH (ref 0.61–1.24)
GFR calc Af Amer: 45 mL/min — ABNORMAL LOW (ref >60)
GFR calc non Af Amer: 39 mL/min — ABNORMAL LOW (ref >60)
Glucose, Bld: 157 mg/dL — ABNORMAL HIGH (ref 70–99)
Phosphorus: 5.5 mg/dL — ABNORMAL HIGH (ref 2.5–4.6)
Potassium: 4.2 mmol/L (ref 3.5–5.1)
Sodium: 136 mmol/L (ref 135–145)

## 2018-09-22 LAB — GLUCOSE, CAPILLARY
Glucose-Capillary: 122 mg/dL — ABNORMAL HIGH (ref 70–99)
Glucose-Capillary: 127 mg/dL — ABNORMAL HIGH (ref 70–99)
Glucose-Capillary: 134 mg/dL — ABNORMAL HIGH (ref 70–99)
Glucose-Capillary: 134 mg/dL — ABNORMAL HIGH (ref 70–99)
Glucose-Capillary: 136 mg/dL — ABNORMAL HIGH (ref 70–99)
Glucose-Capillary: 150 mg/dL — ABNORMAL HIGH (ref 70–99)

## 2018-09-22 LAB — MAGNESIUM
Magnesium: 2.6 mg/dL — ABNORMAL HIGH (ref 1.7–2.4)
Magnesium: 2.8 mg/dL — ABNORMAL HIGH (ref 1.7–2.4)
Magnesium: 2.6 mg/dL — ABNORMAL HIGH (ref 1.7–2.4)

## 2018-09-22 LAB — APTT: aPTT: 26 seconds (ref 24–36)

## 2018-09-22 LAB — PHOSPHORUS
Phosphorus: 5.7 mg/dL — ABNORMAL HIGH (ref 2.5–4.6)
Phosphorus: 5.5 mg/dL — ABNORMAL HIGH (ref 2.5–4.6)

## 2018-09-22 LAB — PATHOLOGIST SMEAR REVIEW

## 2018-09-22 MED ORDER — DILTIAZEM 12 MG/ML ORAL SUSPENSION
30.0000 mg | Freq: Three times a day (TID) | ORAL | Status: DC
  Administered 2018-09-22 – 2018-09-24 (×6): 30 mg
  Filled 2018-09-22 (×6): qty 3

## 2018-09-22 MED ORDER — PRO-STAT SUGAR FREE PO LIQD: 30.0000 mL | Freq: Two times a day (BID) | ORAL | Status: DC

## 2018-09-22 MED ORDER — ZOLPIDEM TARTRATE 5 MG PO TABS
5.0000 mg | ORAL_TABLET | Freq: Every evening | ORAL | Status: DC | PRN
  Administered 2018-09-23: 5 mg via ORAL
  Filled 2018-09-22: qty 1

## 2018-09-22 MED ORDER — VITAL AF 1.2 CAL PO LIQD
1000.0000 mL | ORAL | Status: DC
  Administered 2018-09-23 – 2018-09-25 (×4): 1000 mL
  Filled 2018-09-22: qty 1000

## 2018-09-22 MED ORDER — INSULIN ASPART 100 UNIT/ML ~~LOC~~ SOLN
0.0000 [IU] | SUBCUTANEOUS | Status: DC
  Administered 2018-09-22 (×3): 1 [IU] via SUBCUTANEOUS
  Administered 2018-09-23: 3 [IU] via SUBCUTANEOUS
  Administered 2018-09-23: 2 [IU] via SUBCUTANEOUS
  Administered 2018-09-23 (×2): 1 [IU] via SUBCUTANEOUS
  Administered 2018-09-23 – 2018-09-24 (×4): 2 [IU] via SUBCUTANEOUS
  Administered 2018-09-24: 3 [IU] via SUBCUTANEOUS
  Administered 2018-09-24: 2 [IU] via SUBCUTANEOUS
  Administered 2018-09-24: 3 [IU] via SUBCUTANEOUS
  Administered 2018-09-24 – 2018-09-25 (×4): 2 [IU] via SUBCUTANEOUS
  Administered 2018-09-26 – 2018-09-27 (×4): 1 [IU] via SUBCUTANEOUS

## 2018-09-22 MED ORDER — GABAPENTIN 300 MG/6ML PO SOLN
300.0000 mg | Freq: Two times a day (BID) | ORAL | Status: DC
  Administered 2018-09-22 – 2018-09-26 (×10): 300 mg
  Filled 2018-09-22 (×13): qty 6

## 2018-09-22 MED ORDER — VITAL HIGH PROTEIN PO LIQD
1000.0000 mL | ORAL | Status: AC
  Administered 2018-09-22: 1000 mL

## 2018-09-22 MED ORDER — PRO-STAT SUGAR FREE PO LIQD
60.0000 mL | Freq: Two times a day (BID) | ORAL | Status: DC
  Administered 2018-09-22 – 2018-09-25 (×6): 60 mL
  Filled 2018-09-22 (×6): qty 60

## 2018-09-22 NOTE — Progress Notes (Signed)
[facility] Progress Note  Subjective: - 6.6 L net yesterday, BP's remain high but under better control. Maintaining NSR most of the time, HR 80's.   Vitals:   09/22/18 0430 09/22/18 0500 09/22/18 0530 09/22/18 0600  BP:  (!) 167/80  (!) 177/63  Pulse:   84 87  Resp: (!) 28 (!) 29 (!) 49 (!) 34  Temp:      TempSrc:      SpO2:   100% 100%  Weight:      Height:        Inpatient medications: . amLODipine  5 mg Oral BID  . chlorhexidine gluconate (MEDLINE KIT)  15 mL Mouth Rinse BID  . Chlorhexidine Gluconate Cloth  6 each Topical Daily  . cloNIDine  0.2 mg Transdermal Weekly  . diltiazem  30 mg Oral Q8H  . gabapentin  300 mg Per Tube BID  . insulin aspart  1-3 Units Subcutaneous Q4H  . insulin aspart  3 Units Subcutaneous Q4H  . insulin detemir  15 Units Subcutaneous Q12H  . mouth rinse  15 mL Mouth Rinse 10 times per day  . methylPREDNISolone (SOLU-MEDROL) injection  40 mg Intravenous Q12H  . pantoprazole sodium  40 mg Per Tube Q24H  . vitamin B-6  300 mg Per Tube Daily   .  prismasol BGK 4/2.5 400 mL/hr at 09/21/18 1106  .  prismasol BGK 4/2.5 200 mL/hr at 09/21/18 1106  . dextrose 40 mL/hr at 09/22/18 0600  . prismasol BGK 2/2.5 dialysis solution 2,000 mL/hr at 09/21/18 1827   acetaminophen **OR** acetaminophen, albuterol, alteplase, bisacodyl, dextrose, heparin, heparin, heparin, labetalol, metoprolol tartrate, [DISCONTINUED] ondansetron **OR** ondansetron (ZOFRAN) IV, sennosides, sodium chloride flush    Exam: Gen on HFNC, not in distress, lethargic Sclera anicteric, throat w ETT  No jvd or bruits Chest clear bilat ant and lat RRR no MRG, tachy Abd soft ntnd no mass or ascites +bs very obese GU normal male w/ foley cath in  MS no joint effusions or deformity Ext diffuse 1-2+ LE and UE edema Neuro follows commands    Home meds:  - diltiazem 240 qd/ losartan 100 qd/ metoprolol 25 bid  - gabapentin 300 tid/ lorazepam 0.33m qid prn/  prochlorperazine 10 qid prn   - prn's/ vitamins/ supplements    UA 11-20 wbc/ > 50 rbc/ rare bact, 0-5 epis, neg protein, 1.011, 5.0   UNa 50  Ucreat 45.7  Renal UKorea6/8 > Negative for hydronephrosis. No acute abnormality.  Fatty infiltration a of the liver and small volume perihepatic ascites.  CXR > 6/8  IMPRESSION: Interval placement of right Vas-Cath with the tip in the SVC. No pneumothorax. Remainder the support devices are stable. Mild cardiomegaly, vascular congestion.  Interstitial and airspace opacities slightly improved.  Assessment: 1. Acute renal failure - shock /ATN vs other. CRRT started on 6/8.  D#5.  CRRT continues to remove lots of the excess volume. Cont another 24 hrs if ok w/ primary service.  2. Vent dep resp failure/ pulm infiltrates - sp IV abx thru 6/5 3. Vol overload - wt's down, still high BP's and significant edema 4. Bladder cancer stage IV - f/b ONC 5. HTN - BP's^^, started norvasc per NG and topical clonidine 6. Atrial fib - on po dilt 30 tid, BB stopped due to bradycardia 6/9 7. Anemia multifact  8. Hyperkalemia - changed dialysate 4K > 2K, better 9. Thrombocytopenia - check haptoglobin, order smear for schistocytes  [facility] 09/22/2018,  6:52 AM  Iron/TIBC/Ferritin/ %Sat    Component Value Date/Time   IRON 88 07/24/2018 1230   TIBC 240 07/24/2018 1230   FERRITIN 3,307 (H) 09/08/2018 1047   IRONPCTSAT 37 07/24/2018 1230   Recent Labs  Lab 09/21/18 1539 09/22/18 0320  NA 138 137  K 4.8 4.5  CL 105 102  CO2 26 27  GLUCOSE 122* 138*  BUN 49* 45*  CREATININE 1.44* 1.55*  CALCIUM 8.1* 8.4*  PHOS 4.7*  --   ALBUMIN 2.8* 2.9*   Recent Labs  Lab 09/22/18 0320  AST 72*  ALT 71*  ALKPHOS 55  BILITOT 0.8  PROT 4.9*   Recent Labs  Lab 09/22/18 0320  WBC 12.8*  HGB 7.8*  HCT 25.1*  PLT 50*

## 2018-09-22 NOTE — Progress Notes (Signed)
Nutrition Follow-up  DOCUMENTATION CODES:   Obesity unspecified  INTERVENTION:   Will let Vital HP run out then:  -Initiate Vital AF 1.2 @ 65 ml/hr via NGT.  -60 ml Prostat BID -This regimen provides 2272 kcal, 177g protein, 1265 ml H2O, 2566 mg K and 1566 mg Phos.  NUTRITION DIAGNOSIS:   Inadequate oral intake related to inability to eat as evidenced by NPO status.  Ongoing.  GOAL:   Patient will meet greater than or equal to 90% of their needs  Will meet with TF.  MONITOR:   Labs, Weight trends, TF tolerance, I & O's  REASON FOR ASSESSMENT:   Consult Enteral/tube feeding initiation and management  ASSESSMENT:   68 y.o. male with medical history significant of hypertension, hyperlipidemia, prediabetes, prostate cancer, metastasized bladder cancer on chemotherapy, PAF, CKD stage III, MSSA bacteremia.  Patient presented secondary to significant weakness overnight. Admitted for pneumonia.  6/2-6/10: intubated 6/8: CRRT started  **RD working remotely**  Per SLP evaluation this morning, pt continues to have severe aspiration risk. Recommends nutrition via alternate means. NGT was placed today and TF protocol was initiated. VHP now running at 40 ml/hr with 30 ml Prostat BID, providing 1160 kcal and 114g protein. Will allow this bottle to run out over the next 24 hours to prevent waste of resources. Then will initiate Vital AF 1.2 @ 65 ml/hr with 60 ml Prostat BID to better meet patient's increased needs.  Weight is trending down since 6/9. Per I/O's: +1.1L since admit. Pt is still receiving CRRT. Pt with severe edema.  Medications: Vitamin B-6 tablet daily, IV Zofran PRN Labs reviewed: CBGs: 122-136 Elevated Mg, Phos GFR: 46  Diet Order:   Diet Order            Diet NPO time specified  Diet effective now              EDUCATION NEEDS:   Not appropriate for education at this time  Skin:  Skin Assessment: Reviewed RN Assessment  Last BM:  6/11  Height:    Ht Readings from Last 1 Encounters:  09/13/18 $RemoveB'6\' 2"'icpBoabh$  (4.31 m)    Weight:   Wt Readings from Last 1 Encounters:  09/22/18 117 kg    Ideal Body Weight:  86.3 kg  BMI:  Body mass index is 33.12 kg/m.  Estimated Nutritional Needs:   Kcal:  2380-2610 kcal  Protein:  >/= 175 grams  Fluid:  >/= 2.2 L/day  Clayton Bibles, MS, RD, LDN [place] Dietitian Pager: [phone] After Hours Pager: [phone]

## 2018-09-22 NOTE — Progress Notes (Signed)
NAME:  [name] Rose, MRN:  [id number], DOB:  02/17/1951, LOS: 31 ADMISSION DATE:  09/08/2018, CONSULTATION DATE:  [id number] REFERRING MD:  Dr. Sloan Leiter, [facility], CHIEF COMPLAINT:  Short of breath   Brief History   68 y/o male former smoker presented to ER with weakness, hypoxia, fever 101.5.  He has hx of metastatic bladder cancer on targeted therapy with Padcev x 3 cycles.  CXR showed pulmonary infiltrates.  COVID testing negative x 2.  He had progressive hypoxia and PCCM asked to assess. Extubated 09/20/2018  Past Medical History  HTN, HLD, Prostate cancer, PAF, CKD 3, MSSA Bacteremia March 2020, chemotherapy induced neutropenia, moderate mitral regurgitation  Significant Hospital Events   5/29 Admit 5/31 solumedrol started, heparin gtt started, transfuse PRBC 6/01 add bactrim for possible PCP 6/02 VDRF 6/04 d/c heparin gtt due to epistaxis 6/05 d/c ABx, transfuse PRBC 6/06 start to wean steroids 6/07 resume heparin gtt 6/08 renal fxn worse. Volume overloaded and hyperkalemic w/ worsening acid base, started on CRRT 6/8 through 6/10: Making progress on volume status.  On 610 chest x-ray remarkably improved, still 14 L positive but removing with CRRT.  Tolerating spontaneous breathing trial working towards extubation 6/10 extubated  Consults:  Oncology 5/30 bladder cancer ID 5/31 atypical pneumonia  Procedures:  ETT 6/02 >>  Bronchoscopy 6/02 >> fungal elements on cytology, WBC 220 (76% N, 18% L, 6% M) L IJ TLC 6/2 >> R IJ HD 6/8 >>  Significant Diagnostic Tests:  Dopper legs b/l 5/31 >> no DVT CT chest 5/31 >> atherosclerosis, extensive GGO with inter and intralobular thickening, fatty liver    Micro Data:  COVID 5/29 >> negative COVID 5/29 >> negative Influenza 5/30 >> negative BAL 6/02 >> 5k Candida BAL AFB 6/02 >>   BAL Fungal 6/02 >> Yeast PCP 6/02 >> negative  Antimicrobials:  Vancomycin 5/28 >> 6/05 Cefepime 5/28 >> 6/05 Bactrim 6/01 >> 6/02  Interim  history/subjective:    Remains on CRRT 15 L negative the last 48 hours Did not do well with swallowing evaluation this morning  Objective   Blood pressure (!) 163/64, pulse 80, temperature (!) 97.5 F (36.4 C), temperature source Oral, resp. rate (!) 29, height _0  (1.88 m), weight 117 kg, SpO2 100 %.        Intake/Output Summary (Last 24 hours) at 09/22/2018 0924 Last data filed at 09/22/2018 0900 Gross per 24 hour  Intake 897 ml  Output 7932 ml  Net -7035 ml   Filed Weights   09/20/18 0300 09/21/18 0407 09/22/18 0305  Weight: 128.3 kg 120.8 kg 117 kg    Examination: Elderly gentleman, comfortable Moist oral mucosa Scattered rhonchi, rales at the bases Irregularly irregular pulse Bowel sounds appreciated Extremities is warm and dry, edema plus plus Neurologically-awake and alert, following commands  Chest x-ray 09/20/2018-reviewed by myself showing significant improvement   Resolved Hospital Problem list   Epistaxis Non-anion gap metabolic acidosis  Assessment & Plan:   Acute hypoxemic respiratory failure -Successfully extubated 09/20/2018 -Discontinue steroids -PRN bronchodilators  Acute kidney injury on chronic kidney disease -On CRRT, fluid been removed --15 L in the last 48 hours -Trend electrolytes -Renal dose medications  Paroxysmal atrial fibrillation Bradycardia -Anticoagulation held secondary to thrombocytopenia and epistaxis -Continue beta-blockade  Hypertension -Monitor closely -PRN labetalol  Anemia -Related to critical illness -Trend CBC -Transfuse more globin if less than 7  Metastatic bladder cancer on targeted therapy -Outpatient follow-up after discharge -Appreciate oncology input  Steroid-induced hyperglycemia -Continue Levemir -We  will discontinue steroids today  Feeding -Poor performance on swallow study today - will place a small bore feeding tube to start tube feeds enterally  Acute metabolic encephalopathy -Appears  to be improving  Acute metabolic encephalopathy -Monitor closely -Appears to be improving  Significant deconditioning  Best practice:  Diet: We will place small bore tube and start tube feeding DVT prophylaxis: Continue SCDs, heparin on hold due to epistaxis GI prophylaxis: Continue PPI Mobility: Bed rest Code Status: Full code Disposition: ICU  The patient is critically ill with multiple organ system failure and requires high complexity decision making for assessment and support, frequent evaluation and titration of therapies, advanced monitoring, review of radiographic studies and interpretation of complex data.    Critical Care Time devoted to patient care services, exclusive of separately billable procedures, described in this note is 32 minutes.  Sherrilyn Rist, MD [place], PCCM Cell: [phone]

## 2018-09-22 NOTE — Evaluation (Signed)
Clinical/Bedside Swallow Evaluation Patient Details  Name: [name] Rose MRN: [id number] Date of Birth: Jul 01, 1950  Today's Date: 09/22/2018 Time: SLP Start Time (ACUTE ONLY): 1179 SLP Stop Time (ACUTE ONLY): 0855 SLP Time Calculation (min) (ACUTE ONLY): 33 min  Past Medical History:  Past Medical History:  Diagnosis Date  . Arthritis   . 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 (HCC) 09/29/2016  . Bladder tumor   . Essential hypertension 06/23/2018  . Goals of care, counseling/discussion 09/30/2016  . History of prostate cancer followed by pcp dr Tiburcio Pea-  per pt last PSA undetectable   dx 2008-- (Stage T1c, Gleason 3+3,  PSA 4.58, vol 99cc)  s/p  radical prostatectomy (nerve sparing bilateral)   . Hyperglycemia 06/23/2018  . Hypertension   . Lower urinary tract symptoms (LUTS)   . Mild hyperlipidemia 06/23/2018  . Pre-diabetes   . Wears glasses    Past Surgical History:  Past Surgical History:  Procedure Laterality Date  . CATARACT EXTRACTION W/ INTRAOCULAR LENS  IMPLANT, BILATERAL Bilateral 2011  . CERVICAL SPINE SURGERY  1997  . IR FLUORO GUIDE PORT INSERTION RIGHT  10/07/2016  . IR RADIOLOGIST EVAL & MGMT  01/05/2018  . IR US GUIDE VASC ACCESS RIGHT  10/07/2016  . KNEE ARTHROSCOPY Bilateral right 2006;  left 02-15-2007  . LUMBAR SPINE SURGERY  1999;  1990;  1983  . ROBOT ASSISTED LAPAROSCOPIC RADICAL PROSTATECTOMY  06/20/2006   bilateral nerve sparing  . TEE WITHOUT CARDIOVERSION N/A 06/27/2018   Procedure: TRANSESOPHAGEAL ECHOCARDIOGRAM (TEE);  Surgeon: Elder Negus, MD;  [facility];  Service: Cardiovascular;  Laterality: N/A;  . TOTAL HIP ARTHROPLASTY Left 03/03/2015   Procedure: LEFT TOTAL HIP ARTHROPLASTY ANTERIOR APPROACH;  Surgeon: Jodi Geralds, MD;  Location: [facility] OR;  Service: Orthopedics;  Laterality: Left;  . TOTAL KNEE ARTHROPLASTY Bilateral left 08-13-2009;  right 12-26-2009  . TRANSURETHRAL RESECTION OF BLADDER TUMOR N/A 09/20/2016    Procedure: TRANSURETHRAL RESECTION OF BLADDER TUMOR (TURBT);  Surgeon: Marcine Matar, MD;  Location[facility];  Service: Urology;  Laterality: N/A;   HPI:  pt is a 68 yo male adm to [facility] with respiratory difficulties- diagnosed with pna and developed renal failure.  PMH + for prostate cancer with bladder mets, chemo induced mucositis, - he required intubation and is on CRRT at this time.  Pt intubated 6/2-6/01/2019.    Pt has been on chemo and is hyperglycemic due to steroids. Swallow eval ordered.  Pt had OG when intubated but it was removed with extubation.   Assessment / Plan / Recommendation Clinical Impression  Pt presents with indications of reversible pharyngeal dysphagia due to gross deconditioning and lengthy intubation.  He has a contusion on his right posterior palate and a few white patches that may be concerning for oral candidiasis.  In addition, pt with excessive lingual movement, lingual protrusion recurrently - ? extra pyramidal movement?  RN reports she did not receive said information in report.  With few ice chip intake, increased RR from 23 to 29 and complained of dyspnea.  Pt noted to make extraneous phonation after swallow that may have indicated laryngeal penetration.  Uncertain if this was due to laryngeal penetration and attempts at throat clearing.  Quickly after this he reported shortness of breath  Delayed responses to SlP questions observed, pt reports he is "tired".  At this time, recommend npo except occasional ice chip when pt is not dyspenic and sitting fully upright.  Anticipate pt may benefit from  a few days to allow edema to diminish, stablization of respiratory status and phonation to improve.  Recommend to consider short term alternative means of nutrition and SLP will follow up on Monday 09/25/2018.  RN and pt informed and agreeable to recommendations.  Thanks for this consult. SLP Visit Diagnosis: Dysphagia, pharyngeal phase (R13.13);Other  (comment)    Aspiration Risk  Severe aspiration risk;Risk for inadequate nutrition/hydration    Diet Recommendation Alternative means - temporary;Ice chips PRN after oral care(few small ice chips as tolerated after oral care)   Medication Administration: Via alternative means    Other  Recommendations Oral Care Recommendations: Oral care QID   Follow up Recommendations        Frequency and Duration min 1 x/week  1 week       Prognosis Prognosis for Safe Diet Advancement: Fair Barriers to Reach Goals: Other (Comment)(current medical illness)      Swallow Study   General Date of Onset: 08/09/18 HPI: pt is a 68 yo male adm to [facility] with respiratory difficulties- diagnosed with pna and developed renal failure.  PMH + for prostate cancer with bladder mets, chemo induced mucositis, - he required intubation and is on CRRT at this time.  Pt intubated 6/2-6/01/2019.    Pt has been on chemo and is hyperglycemic due to steroids. Swallow eval ordered.  Pt had OG when intubated but it was removed with extubation. Type of Study: Bedside Swallow Evaluation Diet Prior to this Study: NPO Respiratory Status: Other (comment)(4 L HFNC) History of Recent Intubation: Yes Length of Intubations (days): 9 days Date extubated: 09/20/18 Behavior/Cognition: Alert;Other (Comment);Requires cueing(delayed response) Oral Cavity Assessment: Other (comment);Within Functional Limits(appears with contusion on posterior right palate, ? small areas of oral candidiasis pt denies lingula or oral discomfort) Oral Care Completed by SLP: Recent completion by staff Oral Cavity - Dentition: Adequate natural dentition Vision: Impaired for self-feeding Self-Feeding Abilities: Total assist Patient Positioning: Upright in bed Baseline Vocal Quality: Low vocal intensity Volitional Cough: Weak Volitional Swallow: Able to elicit    Oral/Motor/Sensory Function Overall Oral Motor/Sensory Function: Generalized oral  weakness(signficant weakness, excessive lingual movement, ? extrapyramidal symptom)   Ice Chips Ice chips: Impaired Presentation: Spoon Oral Phase Impairments: Reduced labial seal(excessive lingual movement and lingual thrusting noted throughout session even without manipulating ice chips) Oral Phase Functional Implications: Prolonged oral transit Pharyngeal Phase Impairments: Multiple swallows;Decreased hyoid-laryngeal movement;Change in Vital Signs;Throat Clearing - Immediate(pt did complain that he felt dyspneic) Other Comments: pt with increased RR from 23 to 29 and complained of dyspnea, pt noted to make extraneous phonation, uncertain if this was due to laryngeal penetration and attempts at throat clearing; quickly after this he reported shortness of breath   Thin Liquid Other Comments: moisture from toothette only    Nectar Thick Nectar Thick Liquid: Not tested   Honey Thick Honey Thick Liquid: Not tested   Puree Puree: Not tested   Solid     Solid: Not tested      Macario Golds 09/22/2018,10:39 AM  Luanna Salk, MS [place] Pager [phone] Office [phone]

## 2018-09-22 NOTE — Progress Notes (Signed)
Overall, Mr. Hirsch is making some progress.  He is still on the CVVH.  His renal function is looking a whole lot better.  His BUN is 45 creatinine 1.8.  He is making some urine.  He really is not eating much.  Hopefully, he will not need to have another NG tube placed down for feeding.  He has had a ton of weight taken off with the dialysis.  I am sure this is helped quite a bit.  I am a little surprised that the platelet count has dropped a little bit.  His platelet count today was 50,000.  His hemoglobin was 7.8.  His white cell count is 12.8.  The LDH came down a little bit to 625.  His heart rate is doing much better.  His blood pressure also is coming down a little bit.  There is been no obvious bleeding so that is always a good thing.  There is been no obvious nausea or vomiting.  I know that he still has a long way to go.  He is going need a lot of physical therapy once he is able to participate.  On his physical exam, his vital signs show temperature of 98.  Pulse 78.  Blood pressure 172/71.  Oxygen saturation is 100%.  Weight is 257 pounds.  At this point, I think we will have to watch the platelet count closely.  I will have him take a look at the blood smear.  I, again, thank the ICU staff for their incredible care.  As always they have done a wonderful job with Mr. Deis!!  [name], MD  Darlyn Chamber 33:6

## 2018-09-23 LAB — CBC WITH DIFFERENTIAL/PLATELET
Band Neutrophils: 0 %
Basophils Absolute: 0 10*3/uL (ref 0.0–0.1)
Basophils Relative: 0 %
Blasts: 0 %
Eosinophils Absolute: 0 10*3/uL (ref 0.0–0.5)
Eosinophils Relative: 0 %
HCT: 25.1 % — ABNORMAL LOW (ref 39.0–52.0)
Hemoglobin: 7.9 g/dL — ABNORMAL LOW (ref 13.0–17.0)
Lymphocytes Relative: 3 %
Lymphs Abs: 0.5 10*3/uL — ABNORMAL LOW (ref 0.7–4.0)
MCH: 32.8 pg (ref 26.0–34.0)
MCHC: 31.5 g/dL (ref 30.0–36.0)
MCV: 104.1 fL — ABNORMAL HIGH (ref 80.0–100.0)
Metamyelocytes Relative: 0 %
Monocytes Absolute: 1.2 10*3/uL — ABNORMAL HIGH (ref 0.1–1.0)
Monocytes Relative: 7 %
Myelocytes: 0 %
Neutro Abs: 15 10*3/uL — ABNORMAL HIGH (ref 1.7–7.7)
Neutrophils Relative %: 90 %
Other: 0 %
Platelets: 43 10*3/uL — ABNORMAL LOW (ref 150–400)
Promyelocytes Relative: 0 %
RBC: 2.41 MIL/uL — ABNORMAL LOW (ref 4.22–5.81)
RDW: 21.9 % — ABNORMAL HIGH (ref 11.5–15.5)
WBC: 16.7 10*3/uL — ABNORMAL HIGH (ref 4.0–10.5)
nRBC: 0 /100 WBC
nRBC: 4 % — ABNORMAL HIGH (ref 0.0–0.2)

## 2018-09-23 LAB — GLUCOSE, CAPILLARY
Glucose-Capillary: 140 mg/dL — ABNORMAL HIGH (ref 70–99)
Glucose-Capillary: 180 mg/dL — ABNORMAL HIGH (ref 70–99)
Glucose-Capillary: 201 mg/dL — ABNORMAL HIGH (ref 70–99)
Glucose-Capillary: 191 mg/dL — ABNORMAL HIGH (ref 70–99)
Glucose-Capillary: 168 mg/dL — ABNORMAL HIGH (ref 70–99)

## 2018-09-23 LAB — COMPREHENSIVE METABOLIC PANEL
ALT: 65 U/L — ABNORMAL HIGH (ref 0–44)
AST: 52 U/L — ABNORMAL HIGH (ref 15–41)
Albumin: 2.8 g/dL — ABNORMAL LOW (ref 3.5–5.0)
Alkaline Phosphatase: 58 U/L (ref 38–126)
Anion gap: 8 (ref 5–15)
BUN: 42 mg/dL — ABNORMAL HIGH (ref 8–23)
CO2: 26 mmol/L (ref 22–32)
Calcium: 8.1 mg/dL — ABNORMAL LOW (ref 8.9–10.3)
Chloride: 103 mmol/L (ref 98–111)
Creatinine, Ser: 1.6 mg/dL — ABNORMAL HIGH (ref 0.61–1.24)
GFR calc Af Amer: 51 mL/min — ABNORMAL LOW (ref >60)
GFR calc non Af Amer: 44 mL/min — ABNORMAL LOW (ref >60)
Glucose, Bld: 183 mg/dL — ABNORMAL HIGH (ref 70–99)
Potassium: 3.9 mmol/L (ref 3.5–5.1)
Sodium: 137 mmol/L (ref 135–145)
Total Bilirubin: 1 mg/dL (ref 0.3–1.2)
Total Protein: 5.1 g/dL — ABNORMAL LOW (ref 6.5–8.1)

## 2018-09-23 LAB — LACTATE DEHYDROGENASE: LDH: 559 U/L — ABNORMAL HIGH (ref 98–192)

## 2018-09-23 LAB — PHOSPHORUS: Phosphorus: 4.4 mg/dL (ref 2.5–4.6)

## 2018-09-23 LAB — MAGNESIUM: Magnesium: 2.7 mg/dL — ABNORMAL HIGH (ref 1.7–2.4)

## 2018-09-23 LAB — HAPTOGLOBIN: Haptoglobin: <10 mg/dL — ABNORMAL LOW (ref 32–363)

## 2018-09-23 LAB — APTT: aPTT: 25 seconds (ref 24–36)

## 2018-09-23 MED ORDER — AMLODIPINE BESYLATE 5 MG PO TABS
5.0000 mg | ORAL_TABLET | Freq: Two times a day (BID) | ORAL | Status: DC
  Administered 2018-09-23: 5 mg
  Filled 2018-09-23: qty 1

## 2018-09-23 MED ORDER — CHLORHEXIDINE GLUCONATE 0.12 % MT SOLN
15.0000 mL | Freq: Two times a day (BID) | OROMUCOSAL | Status: DC
  Administered 2018-09-24 – 2018-10-14 (×38): 15 mL via OROMUCOSAL
  Filled 2018-09-23 (×39): qty 15

## 2018-09-23 MED ORDER — ORAL CARE MOUTH RINSE
15.0000 mL | Freq: Two times a day (BID) | OROMUCOSAL | Status: DC
  Administered 2018-09-24 – 2018-10-03 (×15): 15 mL via OROMUCOSAL

## 2018-09-23 NOTE — Progress Notes (Signed)
NAME:  [name] Rose, MRN:  [id number], DOB:  Jun 27, 1950, LOS: 37 ADMISSION DATE:  09/08/2018, CONSULTATION DATE:  [id number] REFERRING MD:  Dr. Sloan Leiter, [facility], CHIEF COMPLAINT:  Short of breath   Brief History   68 y/o male former smoker presented to ER with weakness, hypoxia, fever 101.5.  He has hx of metastatic bladder cancer on targeted therapy with Padcev x 3 cycles.  CXR showed pulmonary infiltrates.  COVID testing negative x 2.  He had progressive hypoxia and PCCM asked to assess. Extubated 09/20/2018 Needs hemodialysis  Past Medical History  HTN, HLD, Prostate cancer, PAF, CKD 3, MSSA Bacteremia March 2020, chemotherapy induced neutropenia, moderate mitral regurgitation  Significant Hospital Events   5/29 Admit 5/31 solumedrol started, heparin gtt started, transfuse PRBC 6/01 add bactrim for possible PCP 6/02 VDRF 6/04 d/c heparin gtt due to epistaxis 6/05 d/c ABx, transfuse PRBC 6/06 start to wean steroids 6/07 resume heparin gtt 6/08 renal fxn worse. Volume overloaded and hyperkalemic w/ worsening acid base, started on CRRT 6/8 through 6/10: Making progress on volume status.  On 610 chest x-ray remarkably improved, still 14 L positive but removing with CRRT.  Tolerating spontaneous breathing trial working towards extubation 6/10 extubated  Consults:  Oncology 5/30 bladder cancer ID 5/31 atypical pneumonia  Procedures:  ETT 6/02 >>  Bronchoscopy 6/02 >> fungal elements on cytology, WBC 220 (76% N, 18% L, 6% M) L IJ TLC 6/2 >> R IJ HD 6/8 >>  Significant Diagnostic Tests:  Dopper legs b/l 5/31 >> no DVT CT chest 5/31 >> atherosclerosis, extensive GGO with inter and intralobular thickening, fatty liver    Micro Data:  COVID 5/29 >> negative COVID 5/29 >> negative Influenza 5/30 >> negative BAL 6/02 >> 5k Candida BAL AFB 6/02 >>   BAL Fungal 6/02 >> Yeast PCP 6/02 >> negative  Antimicrobials:  Vancomycin 5/28 >> 6/05 Cefepime 5/28 >> 6/05 Bactrim 6/01 >> 6/02   Interim history/subjective:    CRRT discontinued today Over 20 L negative in the last 72 hours Has feeding tube in place-comfortable  Objective   Blood pressure (!) 153/60, pulse 100, temperature 98.5 F (36.9 C), temperature source Axillary, resp. rate (!) 27, height _0  (1.88 m), weight 110.7 kg, SpO2 100 %.        Intake/Output Summary (Last 24 hours) at 09/23/2018 1045 Last data filed at 09/23/2018 1000 Gross per 24 hour  Intake 1134.34 ml  Output 5481 ml  Net -4346.66 ml   Filed Weights   09/21/18 0407 09/22/18 0305 09/23/18 0359  Weight: 120.8 kg 117 kg 110.7 kg    Examination: Elderly gentleman, comfortable Moist oral mucosa Scattered rhonchi, rales at the bases Irregularly irregular pulse Bowel sounds appreciated Warm and dry extremities Neurologically he is awake and alert, following commands  Chest x-ray from 6/10 reviewed  Resolved Hospital Problem list   Epistaxis Non-anion gap metabolic acidosis  Assessment & Plan:   Acute hypoxemic respiratory failure -Successfully extubated 09/20/2018 -Steroids discontinued 09/22/2018 -PRN bronchodilators  -Is still tachypneic -Still with increased oxygen requirement -Very frail with risk of decompensation  Acute kidney injury on chronic kidney disease -Was on CRRT, fluid being removed.  CRRT stopped -Over 20 L negative in the last 72 hours -Continue to trend electrolytes -Will need hemodialysis, he is not making urine -Renal service following  Paroxysmal atrial fibrillation Bradycardia -Anticoagulation on hold-secondary to thrombocytopenia and epistaxis -Continue beta-blockade  Hypertension -PRN labetalol  Anemia -Related to critical illness -Transfuse if hemoglobin less than  7  Metastatic bladder cancer on targeted therapy -Outpatient follow-up after discharge -Appreciate oncology input  Feeding -Did poorly on swallow study -He has a feeding tube in place -Reassess intermittently for  improvement in his swallowing  Metabolic encephalopathy -Improving  Significant deconditioning Anasarca is getting better  Best practice:  Diet: Continue tube feeds DVT prophylaxis: Continue SCD GI prophylaxis: Continue PPI Mobility: Bed rest Code Status: Full code Disposition: ICU  The patient is critically ill with multiple organ system failure and requires high complexity decision making for assessment and support, frequent evaluation and titration of therapies, advanced monitoring, review of radiographic studies and interpretation of complex data.    Critical Care Time devoted to patient care services, exclusive of separately billable procedures, described in this note is 30 minutes.  Sherrilyn Rist, MD [place], PCCM Cell: [phone]  Updated spouse about the plans to transfer him to [facility]

## 2018-09-23 NOTE — Progress Notes (Signed)
[facility] Progress Note  Subjective: - 5 L yesterday, wt's down 110 kg (113 admit wt). Min UOP. 5L HFNC  Vitals:   09/23/18 0600 09/23/18 0630 09/23/18 0700 09/23/18 0800  BP: 112/61  124/61   Pulse:      Resp: (!) 34 (!) 31 (!) 24   Temp:    (!) 96.5 F (35.8 C)  TempSrc:    Axillary  SpO2:      Weight:      Height:        Inpatient medications: . amLODipine  5 mg Per Tube BID  . chlorhexidine gluconate (MEDLINE KIT)  15 mL Mouth Rinse BID  . Chlorhexidine Gluconate Cloth  6 each Topical Daily  . cloNIDine  0.2 mg Transdermal Weekly  . diltiazem  30 mg Per Tube Q8H  . feeding supplement (PRO-STAT SUGAR FREE 64)  60 mL Per Tube BID  . gabapentin  300 mg Per Tube Q12H  . insulin aspart  0-9 Units Subcutaneous Q4H  . mouth rinse  15 mL Mouth Rinse 10 times per day  . pantoprazole sodium  40 mg Per Tube Q24H  . vitamin B-6  300 mg Per Tube Daily   . feeding supplement (VITAL AF 1.2 CAL)     acetaminophen **OR** acetaminophen, albuterol, bisacodyl, heparin, labetalol, metoprolol tartrate, [DISCONTINUED] ondansetron **OR** ondansetron (ZOFRAN) IV, sennosides, sodium chloride flush, zolpidem    Exam: Gen on HFNC, not in distress, lethargic Sclera anicteric, throat w ETT  No jvd or bruits Chest clear bilat ant and lat RRR no MRG, tachy Abd soft ntnd no mass or ascites +bs very obese GU normal male w/ foley cath in  MS no joint effusions or deformity Ext diffuse 1 diffuse + LE and UE edema Neuro follows commands    Home meds:  - diltiazem 240 qd/ losartan 100 qd/ metoprolol 25 bid  - gabapentin 300 tid/ lorazepam 0.75m qid prn/ prochlorperazine 10 qid prn   - prn's/ vitamins/ supplements    UA 11-20 wbc/ > 50 rbc/ rare bact, 0-5 epis, neg protein, 1.011, 5.0  UNa 50  Ucreat 45.7  Renal [place]6/8 > Negative for hydronephrosis. No acute abnormality.  Fatty infiltration a of the liver and small volume perihepatic ascites.  CXR > 6/10 > IMPRESSION:  Persistent hazy infiltrates of the mid to lower lungs, unchanged.  Assessment: 1. Acute renal failure - shock /ATN vs other. CRRT started on 6/8, will dc CRRT today and watch off of RRT for 1-2 days, look for signs of any renal recovery.  [facility] in days to come.  2. Vent dep resp failure/ pulm infiltrates - sp IV abx thru 6/5 3. Vol overload -  better 4. Bladder cancer stage IV - f/b ONC 5. HTN - clon patch/ norvasc and po dilt 6. Atrial fib - po dilt 30 tid 7. Anemia multifact  8. Thrombocytopenia - check haptoglobin, order smear for schistocytes  [facility] 09/23/2018, 8:19 AM  Iron/TIBC/Ferritin/ %Sat    Component Value Date/Time   IRON 88 07/24/2018 1230   TIBC 240 07/24/2018 1230   FERRITIN 3,307 (H) 09/08/2018 1047   IRONPCTSAT 37 07/24/2018 1230   Recent Labs  Lab 09/23/18 0340  NA 137  K 3.9  CL 103  CO2 26  GLUCOSE 183*  BUN 42*  CREATININE 1.60*  CALCIUM 8.1*  PHOS 4.4  ALBUMIN 2.8*   Recent Labs  Lab 09/23/18 0340  AST 52*  ALT 65*  ALKPHOS 58  BILITOT 1.0  PROT 5.1*   Recent Labs  Lab 09/23/18 0340  WBC 16.7*  HGB 7.9*  HCT 25.1*  PLT 43*

## 2018-09-23 NOTE — Progress Notes (Signed)
Patient to transfer to [place] report given to receiving nurse, all questions answered at this time.  Pt. VSS with no s/s of distress noted.  Patient stable for transfer.  [facility] requested awaiting transport.

## 2018-09-24 ENCOUNTER — Inpatient Hospital Stay (HOSPITAL_COMMUNITY): Payer: PPO

## 2018-09-24 DIAGNOSIS — I480 Paroxysmal atrial fibrillation: Secondary | ICD-10-CM

## 2018-09-24 LAB — COMPREHENSIVE METABOLIC PANEL
ALT: 48 U/L — ABNORMAL HIGH (ref 0–44)
AST: 41 U/L (ref 15–41)
Albumin: 2.2 g/dL — ABNORMAL LOW (ref 3.5–5.0)
Alkaline Phosphatase: 48 U/L (ref 38–126)
Anion gap: 9 (ref 5–15)
BUN: 77 mg/dL — ABNORMAL HIGH (ref 8–23)
CO2: 24 mmol/L (ref 22–32)
Calcium: 7.9 mg/dL — ABNORMAL LOW (ref 8.9–10.3)
Chloride: 103 mmol/L (ref 98–111)
Creatinine, Ser: 3.15 mg/dL — ABNORMAL HIGH (ref 0.61–1.24)
GFR calc Af Amer: 22 mL/min — ABNORMAL LOW (ref >60)
GFR calc non Af Amer: 19 mL/min — ABNORMAL LOW (ref >60)
Glucose, Bld: 198 mg/dL — ABNORMAL HIGH (ref 70–99)
Potassium: 3.8 mmol/L (ref 3.5–5.1)
Sodium: 136 mmol/L (ref 135–145)
Total Bilirubin: 0.7 mg/dL (ref 0.3–1.2)
Total Protein: 4 g/dL — ABNORMAL LOW (ref 6.5–8.1)

## 2018-09-24 LAB — CBC WITH DIFFERENTIAL/PLATELET
Abs Immature Granulocytes: 0.19 10*3/uL — ABNORMAL HIGH (ref 0.00–0.07)
Basophils Absolute: 0 10*3/uL (ref 0.0–0.1)
Basophils Relative: 0 %
Eosinophils Absolute: 0 10*3/uL (ref 0.0–0.5)
Eosinophils Relative: 0 %
HCT: 21.4 % — ABNORMAL LOW (ref 39.0–52.0)
Hemoglobin: 6.6 g/dL — CL (ref 13.0–17.0)
Immature Granulocytes: 1 %
Lymphocytes Relative: 4 %
Lymphs Abs: 0.5 10*3/uL — ABNORMAL LOW (ref 0.7–4.0)
MCH: 33 pg (ref 26.0–34.0)
MCHC: 30.8 g/dL (ref 30.0–36.0)
MCV: 107 fL — ABNORMAL HIGH (ref 80.0–100.0)
Monocytes Absolute: 1 10*3/uL (ref 0.1–1.0)
Monocytes Relative: 7 %
Neutro Abs: 11.4 10*3/uL — ABNORMAL HIGH (ref 1.7–7.7)
Neutrophils Relative %: 88 %
Platelets: 36 10*3/uL — ABNORMAL LOW (ref 150–400)
RBC: 2 MIL/uL — ABNORMAL LOW (ref 4.22–5.81)
RDW: 24.1 % — ABNORMAL HIGH (ref 11.5–15.5)
WBC: 13.1 10*3/uL — ABNORMAL HIGH (ref 4.0–10.5)
nRBC: 1.4 % — ABNORMAL HIGH (ref 0.0–0.2)

## 2018-09-24 LAB — CBC
HCT: 26.5 % — ABNORMAL LOW (ref 39.0–52.0)
Hemoglobin: 8.5 g/dL — ABNORMAL LOW (ref 13.0–17.0)
MCH: 32.6 pg (ref 26.0–34.0)
MCHC: 32.1 g/dL (ref 30.0–36.0)
MCV: 101.5 fL — ABNORMAL HIGH (ref 80.0–100.0)
Platelets: 34 10*3/uL — ABNORMAL LOW (ref 150–400)
RBC: 2.61 MIL/uL — ABNORMAL LOW (ref 4.22–5.81)
RDW: 22.2 % — ABNORMAL HIGH (ref 11.5–15.5)
WBC: 21 10*3/uL — ABNORMAL HIGH (ref 4.0–10.5)
nRBC: 0.5 % — ABNORMAL HIGH (ref 0.0–0.2)

## 2018-09-24 LAB — GLUCOSE, CAPILLARY
Glucose-Capillary: 206 mg/dL — ABNORMAL HIGH (ref 70–99)
Glucose-Capillary: 178 mg/dL — ABNORMAL HIGH (ref 70–99)
Glucose-Capillary: 168 mg/dL — ABNORMAL HIGH (ref 70–99)
Glucose-Capillary: 173 mg/dL — ABNORMAL HIGH (ref 70–99)
Glucose-Capillary: 230 mg/dL — ABNORMAL HIGH (ref 70–99)

## 2018-09-24 LAB — LACTATE DEHYDROGENASE: LDH: 424 U/L — ABNORMAL HIGH (ref 98–192)

## 2018-09-24 LAB — SAVE SMEAR(SSMR), FOR PROVIDER SLIDE REVIEW

## 2018-09-24 LAB — PREPARE RBC (CROSSMATCH)

## 2018-09-24 MED ORDER — DILTIAZEM 12 MG/ML ORAL SUSPENSION
30.0000 mg | Freq: Four times a day (QID) | ORAL | Status: DC
  Administered 2018-09-24 – 2018-10-09 (×59): 30 mg
  Filled 2018-09-24 (×60): qty 3

## 2018-09-24 MED ORDER — DIPHENHYDRAMINE HCL 25 MG PO CAPS
25.0000 mg | ORAL_CAPSULE | Freq: Once | ORAL | Status: AC
  Administered 2018-09-24: 25 mg via ORAL
  Filled 2018-09-24: qty 1

## 2018-09-24 MED ORDER — SODIUM CHLORIDE 0.9% IV SOLUTION
Freq: Once | INTRAVENOUS | Status: AC
  Administered 2018-09-24: 07:00:00 via INTRAVENOUS

## 2018-09-24 MED ORDER — SODIUM CHLORIDE 0.9 % IV SOLN
250.0000 mL | INTRAVENOUS | Status: DC | PRN
  Administered 2018-09-27: 250 mL via INTRAVENOUS

## 2018-09-24 MED ORDER — CHLORHEXIDINE GLUCONATE CLOTH 2 % EX PADS: 6.0000 | MEDICATED_PAD | Freq: Every day | CUTANEOUS | Status: DC

## 2018-09-24 MED ORDER — WHITE PETROLATUM EX OINT
TOPICAL_OINTMENT | CUTANEOUS | Status: AC
  Administered 2018-09-24: 02:00:00
  Filled 2018-09-24: qty 28.35

## 2018-09-24 MED ORDER — SODIUM CHLORIDE 0.9% FLUSH: 3.0000 mL | INTRAVENOUS | Status: DC | PRN

## 2018-09-24 MED ORDER — FLECAINIDE ACETATE 50 MG PO TABS
50.0000 mg | ORAL_TABLET | Freq: Two times a day (BID) | ORAL | Status: DC
  Administered 2018-09-24 – 2018-10-01 (×15): 50 mg via ORAL
  Filled 2018-09-24 (×16): qty 1

## 2018-09-24 MED ORDER — ACETAMINOPHEN 325 MG PO TABS
650.0000 mg | ORAL_TABLET | Freq: Once | ORAL | Status: AC
  Administered 2018-09-24: 650 mg via ORAL
  Filled 2018-09-24: qty 2

## 2018-09-24 MED ORDER — FUROSEMIDE 10 MG/ML IJ SOLN
20.0000 mg | Freq: Once | INTRAMUSCULAR | Status: AC
  Administered 2018-09-24: 20 mg via INTRAVENOUS
  Filled 2018-09-24: qty 2

## 2018-09-24 MED ORDER — SODIUM CHLORIDE 0.9% FLUSH
3.0000 mL | Freq: Two times a day (BID) | INTRAVENOUS | Status: DC
  Administered 2018-09-24 – 2018-10-02 (×17): 3 mL via INTRAVENOUS

## 2018-09-24 NOTE — Progress Notes (Signed)
CRITICAL VALUE ALERT  Critical Value:  Hgb 6.6  Date & Time Notied: 09/24/2018 at 0600  Provider Notified: Paged Dr. Georges Mouse  Orders Received/Actions taken: Awaiting Orders.

## 2018-09-24 NOTE — Progress Notes (Signed)
PRBCs up.  Pt remains in afib w/HR 109; BP 95/56.  Pt is now on 10L/HFNC to maintain sats greater than 92%.  RN at the bedside.  Dr. Teryl Lucy also at the bedside at this time.

## 2018-09-24 NOTE — Progress Notes (Signed)
After second unit of transfusion, Hb is 8.5  [name] Rose, Therapist, sports

## 2018-09-24 NOTE — Progress Notes (Signed)
Patient w/significant change.  HR 150-160s, afib RVR - pt given Metoprolol $RemoveBeforeDE'5mg'WOmBuIEsBsteZvR$  IVP per PRN orders for HR >110; pt w/tachypnea to low 30s; sats dropped on 5L/[place] to mid 80s - O2 bumped to 8L/HFNC and sats hang at 90-91%.  Patient with chills.  Warm blankets provided.  BP 116/77.  Physician paged.

## 2018-09-24 NOTE — Progress Notes (Signed)
Mr. Altier is now over [facility].  He is in the stepdown unit.  I suspect he is over here now in order to get hemodialysis.  His kidneys are still not working well.  His creatinine is up to 3.1.  His BUN is 77.  What really troubles me is his platelet count.  It keeps dropping.  It is now 36,000.  I would not think that this is HITT, but I think we have to check on this.  I am also thinking about a bone marrow biopsy on him.  I see a couple nucleated red blood cells when I look at his bone marrow.  I just wonder if there is not bone marrow involvement by his malignancy.  I would think this to be highly unusual for bladder cancer to do this but it is not unheard of.  His hemoglobin is 6.6.  I am sure that he just has a very low erythropoietic potential because of his renal failure.  I am sure he will need some Aranesp to try to help with his hemoglobin.  I probably would also consider transfusing him.  He is in normal sinus rhythm on the heart monitor.  His heart rate is well controlled.  His O2 sat is very good.  He is still on tube feeds.  He has a panda tube in place.  I ensure that speech pathology is trying to work with him to help with his swallowing.  He really would like to go home.  I just do not think that he is capable of being cared for at home at all.  I know his wife would try but I still think she is able to deal with everything going on with him.  Again, the platelet count is really what troubles me.  I would stop all heparin.  If he needs anticoagulation, maybe he can be on argatroban.  If his platelet count is down more tomorrow, then we will clearly have to get a bone marrow biopsy on him.  I do not see any evidence of infection or sepsis.  [name], MD  [name] 6:10-11

## 2018-09-24 NOTE — Progress Notes (Signed)
[facility] Progress Note  Subjective: haptoglobin is low. UOP a little better, 350 cc today and 150 cc yesterday.   Vitals:   09/24/18 1141 09/24/18 1145 09/24/18 1156 09/24/18 1200  BP: 90/62 90/62 101/70 99/61  Pulse: (!) 129 (!) 144 (!) 137 (!) 127  Resp: (!) $RemoveB'24 20 17 'pVcIhFuG$ (!) 27  Temp: 99 F (37.2 C)  99 F (37.2 C)   TempSrc: Axillary  Axillary   SpO2: 91% 95% 97% 97%  Weight:      Height:        Inpatient medications: . chlorhexidine  15 mL Mouth Rinse BID  . Chlorhexidine Gluconate Cloth  6 each Topical Daily  . diltiazem  30 mg Per Tube Q6H  . feeding supplement (PRO-STAT SUGAR FREE 64)  60 mL Per Tube BID  . flecainide  50 mg Oral Q12H  . gabapentin  300 mg Per Tube Q12H  . insulin aspart  0-9 Units Subcutaneous Q4H  . mouth rinse  15 mL Mouth Rinse q12n4p  . pantoprazole sodium  40 mg Per Tube Q24H  . vitamin B-6  300 mg Per Tube Daily  . sodium chloride flush  3 mL Intravenous Q12H   . sodium chloride    . feeding supplement (VITAL AF 1.2 CAL) 1,000 mL (09/24/18 1006)   sodium chloride, acetaminophen **OR** acetaminophen, albuterol, bisacodyl, labetalol, metoprolol tartrate, [DISCONTINUED] ondansetron **OR** ondansetron (ZOFRAN) IV, sennosides, sodium chloride flush, sodium chloride flush, zolpidem    Exam: Gen on distress, nasal O2, cooperative, weak Sclera anicteric, throat w ETT  No jvd or bruits Chest clear bilat ant and lat RRR no MRG, tachy Abd soft ntnd no mass or ascites +bs very obese GU normal male w/ foley cath in  MS no joint effusions or deformity Ext 1+ diffuse LE and UE edema Neuro alert, Ox 3, conversant    Home meds:  - diltiazem 240 qd/ losartan 100 qd/ metoprolol 25 bid  - gabapentin 300 tid/ lorazepam 0.$RemoveBeforeDE'5mg'MfwxcQsClCnCpwG$  qid prn/ prochlorperazine 10 qid prn   - prn's/ vitamins/ supplements    UA 11-20 wbc/ > 50 rbc/ rare bact, 0-5 epis, neg protein, 1.011, 5.0  UNa 50  Ucreat 45.7  Renal US 6/8 > Negative for hydronephrosis.  No acute abnormality.  Fatty infiltration a of the liver and small volume perihepatic ascites.  CXR > 6/10 > IMPRESSION: Persistent hazy infiltrates of the mid to lower lungs, unchanged.  Assessment: 1. Acute renal failure - shock /ATN vs other. CRRT 6/8- 6/12. UOP improving some, 300 cc today so far. No indication for HD today. Will plan HD tomorrow.  2. Thrombocytopenia/ anemia- haptoglobin low, ?TMA, hem/onc following 3. Vent dep resp failure/ pulm infiltrates - recovered, sp IV abx thru 6/5 4. Vol overload -  better 5. Bladder cancer stage IV - f/b ONC 6. HTN - clon patch/ norvasc and po dilt 7. Atrial fib - po dilt 30 tid  [facility] 09/24/2018, 12:34 PM  Iron/TIBC/Ferritin/ %Sat    Component Value Date/Time   IRON 88 07/24/2018 1230   TIBC 240 07/24/2018 1230   FERRITIN 3,307 (H) 09/08/2018 1047   IRONPCTSAT 37 07/24/2018 1230   Recent Labs  Lab 09/23/18 0340 09/24/18 0512  NA 137 136  K 3.9 3.8  CL 103 103  CO2 26 24  GLUCOSE 183* 198*  BUN 42* 77*  CREATININE 1.60* 3.15*  CALCIUM 8.1* 7.9*  PHOS 4.4  --   ALBUMIN 2.8* 2.2*   Recent Labs  Lab 09/24/18 0512  AST 41  ALT 48*  ALKPHOS 48  BILITOT 0.7  PROT 4.0*   Recent Labs  Lab 09/24/18 0512  WBC 13.1*  HGB 6.6*  HCT 21.4*  PLT 36*

## 2018-09-24 NOTE — Consult Note (Addendum)
Cardiology Consultation:   Patient ID: [name] Rose MRN: [id number]; DOB: 1951-03-24  Admit date: 09/08/2018 Date of Consult: 09/24/2018  Primary Care Provider: Shirline Frees, MD Primary Cardiologist: Pixie Casino, MD  Primary Electrophysiologist:  None   Patient Profile:   [name] Rose is a 68 y.o. male with a hx of metastatic bladder cancer and hx of sepsis and MSSA bacteremia, PAF with hospitalization but could not be anticoagulated due anemia and thrombocytopenia (chemo) and rate control alone was planned, not rhythm control, mod MR, on TEE and no endocarditis that admit who is being seen today for the evaluation of atria fib RVR at the request of Dr. Lonny Prude.  History of Present Illness:   [name] had long hospitalization in March of this year with sepsis and did develop atrial fib.  With anemia and thrombocytopenia from chemo for his metastatic bladder cancer he could not be anticoagulated and rate control was planned with BB and  Dilt.   In office 08/15/18 he was in [place] and felt well.  Continued anemia.  He did have sepsis but no endocarditis on TEE and mod MR.    Now admitted 09/08/18 with weakness.  Fever to 101.5  He had rec'd chemo 1-1/2 weeks prior to admit.  Had not felt well.  He was diagnosed with PNA with significant hypoxia.    Was hypotensive AKE, Hgb to 7.8. with decreased BP his metoprolol and dilt was stopped.  He eventually placed on CVVH  COVID has been neg.  IV heparin was placed 5/31 for his CADSVASC score of 3 but also suspected PE.  Heparin stopped 09/14/18 due to nose bleed and gums and hgb down to 6.5 plts to 59 - heparin resumed on 6/7 --pt has been in SR until the 11th and was placed back on dilt.    He then was intubated 09/12/18 and bronchoscopy was done.   With renal failure renal was consulted and for dialysis today. He went into rapid a fib BP was low anemic and receiving blood.  He rec'd lopressor 5 mg IV and rate down to 130s.from 150s.   On CTA of  chest  And pet scan coronary atherosclerosis noted but no chest pain.    He had been doing well until this admit. He is not aware of racing HR.  Cr 3.15, K+ 3.8  LDH 424 Hgb 6.6 being transfused and plts 36.     BP has been 95 systolic now 740 systolic.  For dialysis today  Past Medical History:  Diagnosis Date   Arthritis    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 ([place]) 09/29/2016   Bladder tumor    Essential hypertension 06/23/2018   Goals of care, counseling/discussion 09/30/2016   History of prostate cancer followed by pcp dr [name]-  per pt last PSA undetectable   dx 2008-- (Stage T1c, Gleason 3+3,  PSA 4.58, vol 99cc)  s/p  radical prostatectomy (nerve sparing bilateral)    Hyperglycemia 06/23/2018   Hypertension    Lower urinary tract symptoms (LUTS)    Mild hyperlipidemia 06/23/2018   Pre-diabetes    Wears glasses     Past Surgical History:  Procedure Laterality Date   CATARACT EXTRACTION W/ INTRAOCULAR LENS  IMPLANT, BILATERAL Bilateral 2011   [place]   IR FLUORO GUIDE PORT INSERTION RIGHT  10/07/2016   IR RADIOLOGIST EVAL & MGMT  01/05/2018   IR US GUIDE VASC ACCESS RIGHT  10/07/2016   KNEE ARTHROSCOPY  Bilateral right 2006;  left 02-15-2007   [place];  1990;  [place]  06/20/2006   bilateral nerve sparing   TEE WITHOUT CARDIOVERSION N/A 06/27/2018   Procedure: TRANSESOPHAGEAL ECHOCARDIOGRAM (TEE);  Surgeon: Nigel Mormon, MD;  Location: [facility] ENDOSCOPY;  Service: Cardiovascular;  Laterality: N/A;   TOTAL HIP ARTHROPLASTY Left 03/03/2015   Procedure: LEFT TOTAL HIP ARTHROPLASTY ANTERIOR APPROACH;  Surgeon: Dorna Leitz, MD;  Location: [place];  Service: Orthopedics;  Laterality: Left;   TOTAL KNEE ARTHROPLASTY Bilateral left 08-13-2009;  right 12-26-2009   TRANSURETHRAL RESECTION OF BLADDER TUMOR N/A 09/20/2016   Procedure: TRANSURETHRAL RESECTION  OF BLADDER TUMOR (TURBT);  Surgeon: Franchot Gallo, MD;  Location: [facility];  Service: Urology;  Laterality: N/A;     Home Medications:  Prior to Admission medications   Medication Sig Start Date End Date Taking? Authorizing Provider  Cinnamon 500 MG TABS Take 1 tablet by mouth 2 (two) times daily.    Yes [provider]  dexamethasone (DECADRON) 4 MG tablet Take 2 tablets (8 mg total) by mouth daily. Start the day after chemotherapy for 2 days. 07/17/18  Yes Volanda Napoleon, MD  diltiazem (CARDIZEM CD) 240 MG 24 hr capsule Take 1 capsule (240 mg total) by mouth daily. 06/28/18  Yes Purohit, Konrad Dolores, MD  gabapentin (NEURONTIN) 400 MG capsule TAKE 1 CAPSULE (400 MG TOTAL) BY MOUTH 4 (FOUR) TIMES DAILY. 09/05/18  Yes Volanda Napoleon, MD  lidocaine-prilocaine (EMLA) cream Apply to affected area once 07/17/18  Yes Ennever, Rudell Cobb, MD  LORazepam (ATIVAN) 0.5 MG tablet TAKE 1 TABLET BY MOUTH EVERY 6 HOURS AS NEEDED FOR NAUSEA AND VOMITING Patient taking differently: Take 0.5 mg by mouth every 6 (six) hours as needed (nausea).  08/30/18  Yes [place], Holli Humbles, NP  losartan (COZAAR) 100 MG tablet Take 100 mg by mouth daily.   Yes [provider]  MAGNESIUM PO Take 400 mg by mouth at bedtime. Leg cramps   Yes [provider]  metoprolol tartrate (LOPRESSOR) 25 MG tablet Take 1 tablet (25 mg total) by mouth 2 (two) times daily. 06/28/18  Yes Purohit, Konrad Dolores, MD  ondansetron (ZOFRAN) 8 MG tablet Take 1 tablet (8 mg total) by mouth 2 (two) times daily as needed for refractory nausea / vomiting. Start on day 3 after chemo. 07/17/18  Yes Volanda Napoleon, MD  prochlorperazine (COMPAZINE) 10 MG tablet Take 1 tablet (10 mg total) by mouth every 6 (six) hours as needed (Nausea or vomiting). 07/17/18  Yes Ennever, Rudell Cobb, MD  Pyridoxine HCl (VITAMIN B-6) 250 MG tablet Take 1 tablet (250 mg total) by mouth daily. 12/23/17  Yes Aline August, MD  senna (SENOKOT) 8.6 MG TABS  tablet Take 1 tablet (8.6 mg total) by mouth 2 (two) times daily. 12/23/17   Aline August, MD    Inpatient Medications: Scheduled Meds:  chlorhexidine  15 mL Mouth Rinse BID   Chlorhexidine Gluconate Cloth  6 each Topical Daily   diltiazem  30 mg Per Tube Q8H   feeding supplement (PRO-STAT SUGAR FREE 64)  60 mL Per Tube BID   gabapentin  300 mg Per Tube Q12H   insulin aspart  0-9 Units Subcutaneous Q4H   mouth rinse  15 mL Mouth Rinse q12n4p   pantoprazole sodium  40 mg Per Tube Q24H   vitamin B-6  300 mg Per Tube Daily   sodium chloride flush  3 mL Intravenous Q12H   Continuous Infusions:  sodium chloride     feeding supplement (VITAL AF 1.2 CAL) 1,000 mL (09/24/18 1006)   PRN Meds: sodium chloride, acetaminophen **OR** acetaminophen, albuterol, bisacodyl, labetalol, metoprolol tartrate, [DISCONTINUED] ondansetron **OR** ondansetron (ZOFRAN) IV, sennosides, sodium chloride flush, sodium chloride flush, zolpidem  Allergies:   No Known Allergies  Social History:   Social History   Socioeconomic History   Marital status: Married    Spouse name: Not on file   Number of children: Not on file   Years of education: Not on file   Highest education level: Not on file  Occupational History   Not on file  Social Needs   Financial resource strain: Not on file   Food insecurity    Worry: Not on file    Inability: Not on file   Transportation needs    Medical: Not on file    Non-medical: Not on file  Tobacco Use   Smoking status: Former Smoker    Years: 16.00    Types: Cigarettes    Quit date: 09/25/1984    Years since quitting: 34.0   Smokeless tobacco: Never Used  Substance and Sexual Activity   Alcohol use: Yes    Comment: occasionally   Drug use: No   Sexual activity: Not on file  Lifestyle   Physical activity    Days per week: Not on file    Minutes per session: Not on file   Stress: Not on file  Relationships   Social connections     Talks on phone: Not on file    Gets together: Not on file    Attends religious service: Not on file    Active member of club or organization: Not on file    Attends meetings of clubs or organizations: Not on file    Relationship status: Not on file   Intimate partner violence    Fear of current or ex partner: Not on file    Emotionally abused: Not on file    Physically abused: Not on file    Forced sexual activity: Not on file  Other Topics Concern   Not on file  Social History Narrative   Not on file    Family History:    Family History  Problem Relation Age of Onset   Hypertension Mother    Aneurysm Mother    Emphysema Father    Hypertension Father      ROS:  Please see the history of present illness.  General:no colds or fevers, no weight changes Skin:no rashes or ulcers HEENT:no blurred vision, no congestion CV:see HPI PUL:see HPI GI:no diarrhea constipation or melena, no indigestion GU:no hematuria, no dysuria, + bladder cancer MS:no joint pain, no claudication Neuro:no syncope, no lightheadedness Endo:pre diabetes, no thyroid disease  All other ROS reviewed and negative.     Physical Exam/Data:   Vitals:   09/24/18 1018 09/24/18 1020 09/24/18 1024 09/24/18 1032  BP: (!) 82/57 (!) 84/49 (!) 87/56 (!) 98/57  Pulse: (!) 119 98 (!) 106 (!) 107  Resp: (!) 35 (!) 37 (!) 25 (!) 37  Temp:      TempSrc:      SpO2: 100% 100% 100% 100%  Weight:      Height:        Intake/Output Summary (Last 24 hours) at 09/24/2018 1122 Last data filed at 09/24/2018 1100 Gross per 24 hour  Intake 975 ml  Output 500 ml  Net 475 ml  Last 3 Weights 09/24/2018 09/23/2018 09/22/2018  Weight (lbs) 256 lb 2.8 oz 244 lb 0.8 oz 257 lb 15 oz  Weight (kg) 116.2 kg 110.7 kg 117 kg     Body mass index is 32.89 kg/m.  General:  Well nourished, well developed, in no acute distress but obviously ill HEENT: normal Lymph: no adenopathy Neck: no JVD Endocrine:  No  thryomegaly Vascular: No carotid bruits; pedal pulses 2+ bilaterally  Cardiac:  Rapid and irregular HR; no murmur or gallup noted Lungs:  clear to auscultation bilaterally- ant, no wheezing, rhonchi or rales  Abd: soft, nontender, no hepatomegaly  Ext: 1+ edema at ankles, SCD stockings on Musculoskeletal:  No deformities, BUE and BLE strength normal and equal Skin: warm and dry  Neuro:  Alert and oriented X3 MAE follows commands, no focal abnormalities noted Psych:  Normal to flat affect   EKG:  The EKG was personally reviewed and demonstrates:  A fib with RVR no acute sT changes Telemetry:  Telemetry was personally reviewed and demonstrates:  A fib with RVR  Relevant CV Studies: Echo 06/22/18 IMPRESSIONS    1. The left ventricle has normal systolic function, with an ejection fraction of 55-60%. The cavity size was normal. Left ventricular diastolic Doppler parameters are consistent with impaired relaxation.  2. The right ventricle has normal systolic function. The cavity was mildly enlarged. There is no increase in right ventricular wall thickness.  3. Right atrial size was mildly dilated.  4. Moderate thickening of the aortic valve Mild calcification of the aortic valve.  5. There is dilatation of the aortic root measuring 39 mm.  6. No vegetations detected.  FINDINGS  Left Ventricle: The left ventricle has normal systolic function, with an ejection fraction of 55-60%. The cavity size was normal. There is no increase in left ventricular wall thickness. Left ventricular diastolic Doppler parameters are consistent with  impaired relaxation. Definity contrast agent was given IV to delineate the left ventricular endocardial borders. Right Ventricle: The right ventricle has normal systolic function. The cavity was mildly enlarged. There is no increase in right ventricular wall thickness. Left Atrium: left atrial size was normal in size Right Atrium: right atrial size was mildly dilated.  Right atrial pressure is estimated at 3 mmHg. Interatrial Septum: No atrial level shunt detected by color flow Doppler. Pericardium: There is no evidence of pericardial effusion. Mitral Valve: The mitral valve is normal in structure. Mitral valve regurgitation was not assessed by color flow Doppler. Tricuspid Valve: The tricuspid valve is normal in structure. Tricuspid valve regurgitation was not visualized by color flow Doppler. Aortic Valve: The aortic valve is normal in structure. Moderate thickening of the aortic valve Mild calcification of the aortic valve. Aortic valve regurgitation was not visualized by color flow Doppler. Pulmonic Valve: The pulmonic valve was normal in structure. Pulmonic valve regurgitation is not visualized by color flow Doppler. Aorta: There is dilatation of the aortic root measuring 39 mm. Venous: The inferior vena cava is normal in size with greater than 50% respiratory variability.   TEE 06/27/18 IMPRESSIONS    1. The left ventricle has normal systolic function, with an ejection fraction of 55-60%.  2. Moderate mitral regurgitation.  3. No valvular vegetations seen.  4. 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  FINDINGS  Left Ventricle: The left ventricle has normal systolic function, with an ejection fraction of 55-60%. Right Ventricle: The right ventricle has normal systolic function. Left Atrium: Left atrial size was normal in size.  Right Atrium: Right atrial size was normal in size. Interatrial Septum: No atrial level shunt detected by color flow Doppler. Mitral Valve: The mitral valve is grossly normal. Mitral valve regurgitation is moderate by color flow Doppler. There is no evidence of mitral valve vegetation. Tricuspid Valve: The tricuspid valve was grossly normal. Tricuspid valve regurgitation is trivial by color flow Doppler. No TV vegetation was visualized. Aortic Valve: The aortic valve is tricuspid Mild thickening of the aortic  valve. There is no stenosis of the aortic valve. There is no evidence of a vegetation on the aortic valve. Pulmonic Valve: The pulmonic valve was grossly normal. Pulmonic valve regurgitation is not visualized by color flow Doppler. Aorta: The ascending aorta is normal in size and structure.   LV Wall Scoring:  MR Peak grad: 109.4 mmHg MR Mean grad: 74.0 mmHg MR Vmax:      523.00 cm/s MR Vmean:     398.0 cm/s    Vernell Leep MD Electronically signed by Vernell Leep MD Signature Date/Time: 06/27/2018/5:27:15 PM      Laboratory Data:  Chemistry Recent Labs  Lab 09/22/18 1617 09/23/18 0340 09/24/18 0512  NA 136 137 136  K 4.2 3.9 3.8  CL 101 103 103  CO2 _0 GLUCOSE 157* 183* 198*  BUN 40* 42* 77*  CREATININE 1.77* 1.60* 3.15*  CALCIUM 8.3* 8.1* 7.9*  GFRNONAA 39* 44* 19*  GFRAA 45* 51* 22*  ANIONGAP _1 Recent Labs  Lab 09/22/18 0320 09/22/18 1617 09/23/18 0340 09/24/18 0512  PROT 4.9*  --  5.1* 4.0*  ALBUMIN 2.9* 2.8* 2.8* 2.2*  AST 72*  --  52* 41  ALT 71*  --  65* 48*  ALKPHOS 55  --  58 48  BILITOT 0.8  --  1.0 0.7   Hematology Recent Labs  Lab 09/22/18 0320 09/23/18 0340 09/24/18 0512  WBC 12.8* 16.7* 13.1*  RBC 2.49* 2.41* 2.00*  HGB 7.8* 7.9* 6.6*  HCT 25.1* 25.1* 21.4*  MCV 100.8* 104.1* 107.0*  MCH 31.3 32.8 33.0  MCHC 31.1 31.5 30.8  RDW 20.2* 21.9* 24.1*  PLT 50* 43* 36*   Cardiac EnzymesNo results for input(s): TROPONINI in the last 168 hours. No results for input(s): TROPIPOC in the last 168 hours.  BNPNo results for input(s): BNP, PROBNP in the last 168 hours.  DDimer No results for input(s): DDIMER in the last 168 hours.  Radiology/Studies:  Dg Abd 1 View  Result Date: 09/22/2018 CLINICAL DATA:  Nasogastric placement. EXAM: ABDOMEN - 1 VIEW COMPARISON:  09/12/2018 FINDINGS: Feeding tube enters the stomach, loops and has its tip in the fundus. Gas pattern within normal limits. IMPRESSION: Feeding tube  enters the stomach, loops and has its tip in the fundus of the stomach. Electronically Signed   By: Nelson Chimes M.D.   On: 09/22/2018 11:03    Assessment and Plan:   1. PAF today with RVR - anticoagulation stopped due to anemia and thrombocytopenia.  Dr. Rayann Heman has seen and increased freq of Dilt and added flecainide. For now would hold off on amiodarone with lung issues and recent LFT elevation.   Still has prn lopressor order 2. CHA2DS2VASc 3 with pre diabetes.  No anticoagulation with plts in 80s - [name] is following and did not believe it is HIT - he may do bone marrow biopsy tomorrow if plts remain low. Previously have just done rate control not rhythm control and when pt improved from acute illness  in March he did convert to SR.   Dr. Antonieta Pert note does mention if had to be on anticoagulation he would recommend argatroban.   3. Anemia with transfusion per IM. 4. PNA with respiratory failure overall has improved.  5. AKI now for dialysis - transferred from [facility] for dialysis. 6. nutrition on tube feeding. meds through tube.  7. No hx of CAD some atherosclerosis seen on CTA of chest and PET but no mention of severe obstruction.      For questions or updates, please contact [place] Please consult [email or web address] for contact info under     Signed, Cecilie Kicks, NP  09/24/2018 11:22 AM   I have seen, examined the patient, and reviewed the above assessment and plan.  Changes to above are made where necessary.  On exam, very ill appearing, iRRR.  The patient was in sinus this am, but now back in AF with RVR. Increase diltiazem as above.  Add low dose flecainide. Hopefully arrhythmia will resolve with treatment of other medical issues. Not a candidate for anticoagulation at this time.  Very complicated situation.  A high level of decision making was required for this encounter.  Co Sign: Thompson Grayer, MD 09/24/2018 1:06 PM

## 2018-09-24 NOTE — Progress Notes (Signed)
Second unit PRBC started $RemoveBefo'@11'tXdyGhaeLiM$ .41am, pt's vitals 90/62, HR 120, RR 24 and Spo2 95% @ 5l

## 2018-09-24 NOTE — Progress Notes (Signed)
PROGRESS NOTE    LONNEL GJERDE  YHC:[id number] DOB: January 28, 1951 DOA: 09/08/2018 PCP: Shirline Frees, MD   Brief Narrative: [name] Rose is a 68 y.o. male who presented with respiratory failure and fever secondary to presumed pneumonia. He was started empirically with antibiotics. COVID-19 negative. He worsened requiring mechanical ventilation and CRRT for acute renal failure. He was successfully transfused with plans to start intermittent HD for continued renal failure. Course has also been complicated by anemia and atrial fibrillation with RVR.   Assessment & Plan:   Principal Problem:   Pneumonia Active Problems:   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 (HCC)   LFT elevation   AKI (acute kidney injury) ([place])   CKD (chronic kidney disease), stage III (HCC)   Acute respiratory failure with hypoxia (HCC)   Acute renal failure (HCC)   Pneumonia Treated empirically with antibiotics. COVID-19 negative x2. ID consulted and recommending discontinuation of antibiotics on 6/8.   Acute respiratory failure with hypoxia Unsure of etiology, but likely pneumonia. Required mechanical ventilation from 6/2 to 6/10. Steroids burst completed. Acute worsening secondary to below -Keep O2 saturations >92% -Wean to room air as able  Atrial fibrillation w/ RVR Paroxysmal atrial fibrillation on metoprolol and diltiazem as an outpatient. Recurrent episodes of RVR. Developed significant tachycardia up to 150s. Metoprolol IV given with some improvement but unfortunately, caused worsening hypotension. Continuing diltiazem. Complicated by worsening hypoxia in addition to requirement of hemodialysis. -Continue diltiazem -Cardiology consult/recommendations  Hypotension Acutely worsened since giving metoprolol in addition to anemia. -Blood transfusion as mentioned below  Acute kidney injury on CKD stage 3 Baseline creatinine of 1.7-1.8. Creatinine of 2.3 on admission in setting  of hypotension. Progressed to requiring CRRT while in the ICU. Nephrology on board. Now transitioning to IHD. -Nephrology recommendations  Acute on chronic anemia Baseline hemoglobin of 9-10. Down to 7.8 today. Unknown etiology. No bleeding noted. Hematology on board with concern for possible renal etiology vs possible BM etiology -CBC daily -Transfuse as needed; 2 units of blood ordered today  Chronic thrombocytopenia Slightly worsened. Unknown etiology. -Hematology recommendations  Essential hypertension Now with soft BP vs hypotension.  Metastatic bladder cancer Currently on chemotherapy treatment. Follows with [name]. Last chemotherapy treatment was 5/18 per patient.  Dysphagia Severe aspiration risk per SLP evaluation on 6/12. Currently on tube feeds   DVT prophylaxis: SCDs Code Status:   Code Status: Full Code Family Communication: None Disposition Plan: Discharge pending further clinical improvement   Consultants:   PCCM  Nephrology  Cardiology  Palliative care  Procedures:   CRRT  Antimicrobials:  Vancomycin  Cefepime  Bactrim    Subjective: Felt fine earlier today. Attempted to get up and developed significant fatigue, palpitations, dyspnea.  Objective: Vitals:   09/24/18 1018 09/24/18 1020 09/24/18 1024 09/24/18 1032  BP: (!) 82/57 (!) 84/49 (!) 87/56 (!) 98/57  Pulse: (!) 119 98 (!) 106 (!) 107  Resp: (!) 35 (!) 37 (!) 25 (!) 37  Temp:      TempSrc:      SpO2: 100% 100% 100% 100%  Weight:      Height:        Intake/Output Summary (Last 24 hours) at 09/24/2018 1037 Last data filed at 09/24/2018 0743 Gross per 24 hour  Intake 975 ml  Output 250 ml  Net 725 ml   Filed Weights   09/22/18 0305 09/23/18 0359 09/24/18 0500  Weight: 117 kg 110.7 kg 116.2 kg    Examination:  General exam:  Appears calm and comfortable Respiratory system: Clear to auscultation. Respiratory effort normal. Cardiovascular system: S1 & S2 heard,  tachycardia. No murmurs, rubs, gallops or clicks. Gastrointestinal system: Abdomen is nondistended, soft and nontender. No organomegaly or masses felt. Normal bowel sounds heard. Central nervous system: Alert and oriented. No focal neurological deficits. Extremities: No calf tenderness Skin: No cyanosis. No rashes Psychiatry: Judgement and insight appear normal. Mood & affect appropriate.     Data Reviewed: I have personally reviewed following labs and imaging studies  CBC: Recent Labs  Lab 09/19/18 0425 09/20/18 1121 09/22/18 0320 09/23/18 0340 09/24/18 0512  WBC 12.3* 19.5* 12.8* 16.7* 13.1*  NEUTROABS  --   --  10.9* 15.0* 11.4*  HGB 9.4* 9.3* 7.8* 7.9* 6.6*  HCT 29.3* 28.8* 25.1* 25.1* 21.4*  MCV 98.0 99.3 100.8* 104.1* 107.0*  PLT 65* 76* 50* 43* 36*   Basic Metabolic Panel: Recent Labs  Lab 09/21/18 0306 09/21/18 1539 09/22/18 0320 09/22/18 1102 09/22/18 1617 09/23/18 0340 09/24/18 0512  NA 138 138 137  --  136 137 136  K 5.3* 4.8 4.5  --  4.2 3.9 3.8  CL 104 105 102  --  101 103 103  CO2 $Re'25 26 27  'tcG$ --  $R'25 26 24  'PJ$ GLUCOSE 107* 122* 138*  --  157* 183* 198*  BUN 59* 49* 45*  --  40* 42* 77*  CREATININE 1.62* 1.44* 1.55*  --  1.77* 1.60* 3.15*  CALCIUM 8.4* 8.1* 8.4*  --  8.3* 8.1* 7.9*  MG 2.4  --  2.6* 2.8* 2.6* 2.7*  --   PHOS 4.2 4.7*  --  5.7* 5.5*  5.5* 4.4  --    GFR: Estimated Creatinine Clearance: 30.8 mL/min (A) (by C-G formula based on SCr of 3.15 mg/dL (H)). Liver Function Tests: Recent Labs  Lab 09/21/18 1539 09/22/18 0320 09/22/18 1617 09/23/18 0340 09/24/18 0512  AST  --  72*  --  52* 41  ALT  --  71*  --  65* 48*  ALKPHOS  --  55  --  58 48  BILITOT  --  0.8  --  1.0 0.7  PROT  --  4.9*  --  5.1* 4.0*  ALBUMIN 2.8* 2.9* 2.8* 2.8* 2.2*   No results for input(s): LIPASE, AMYLASE in the last 168 hours. No results for input(s): AMMONIA in the last 168 hours. Coagulation Profile: No results for input(s): INR, PROTIME in the last 168  hours. Cardiac Enzymes: No results for input(s): CKTOTAL, CKMB, CKMBINDEX, TROPONINI in the last 168 hours. BNP (last 3 results) No results for input(s): PROBNP in the last 8760 hours. HbA1C: No results for input(s): HGBA1C in the last 72 hours. CBG: Recent Labs  Lab 09/23/18 1650 09/23/18 2133 09/24/18 0047 09/24/18 0559 09/24/18 0803  GLUCAP 191* 168* 206* 178* 168*   Lipid Profile: No results for input(s): CHOL, HDL, LDLCALC, TRIG, CHOLHDL, LDLDIRECT in the last 72 hours. Thyroid Function Tests: No results for input(s): TSH, T4TOTAL, FREET4, T3FREE, THYROIDAB in the last 72 hours. Anemia Panel: No results for input(s): VITAMINB12, FOLATE, FERRITIN, TIBC, IRON, RETICCTPCT in the last 72 hours. Sepsis Labs: No results for input(s): PROCALCITON, LATICACIDVEN in the last 168 hours.  No results found for this or any previous visit (from the past 240 hour(s)).       Radiology Studies: Dg Abd 1 View  Result Date: 09/22/2018 CLINICAL DATA:  Nasogastric placement. EXAM: ABDOMEN - 1 VIEW COMPARISON:  09/12/2018 FINDINGS: Feeding tube enters the  stomach, loops and has its tip in the fundus. Gas pattern within normal limits. IMPRESSION: Feeding tube enters the stomach, loops and has its tip in the fundus of the stomach. Electronically Signed   By: Nelson Chimes M.D.   On: 09/22/2018 11:03        Scheduled Meds: . chlorhexidine  15 mL Mouth Rinse BID  . Chlorhexidine Gluconate Cloth  6 each Topical Daily  . diltiazem  30 mg Per Tube Q8H  . feeding supplement (PRO-STAT SUGAR FREE 64)  60 mL Per Tube BID  . gabapentin  300 mg Per Tube Q12H  . insulin aspart  0-9 Units Subcutaneous Q4H  . mouth rinse  15 mL Mouth Rinse q12n4p  . pantoprazole sodium  40 mg Per Tube Q24H  . vitamin B-6  300 mg Per Tube Daily  . sodium chloride flush  3 mL Intravenous Q12H   Continuous Infusions: . sodium chloride    . feeding supplement (VITAL AF 1.2 CAL) 1,000 mL (09/24/18 1006)     LOS:  16 days     Cordelia Poche, MD [facility] 09/24/2018, 10:37 AM  If 7PM-7AM, please contact night-coverage [email or web address]

## 2018-09-25 ENCOUNTER — Inpatient Hospital Stay: Payer: PPO

## 2018-09-25 ENCOUNTER — Other Ambulatory Visit: Payer: PPO

## 2018-09-25 ENCOUNTER — Inpatient Hospital Stay: Payer: PPO | Admitting: Hematology & Oncology

## 2018-09-25 ENCOUNTER — Ambulatory Visit: Payer: PPO

## 2018-09-25 DIAGNOSIS — N183 Chronic kidney disease, stage 3 unspecified: Secondary | ICD-10-CM | POA: Diagnosis present

## 2018-09-25 DIAGNOSIS — N170 Acute kidney failure with tubular necrosis: Secondary | ICD-10-CM

## 2018-09-25 DIAGNOSIS — C799 Secondary malignant neoplasm of unspecified site: Secondary | ICD-10-CM | POA: Diagnosis present

## 2018-09-25 DIAGNOSIS — Z515 Encounter for palliative care: Secondary | ICD-10-CM

## 2018-09-25 LAB — COMPREHENSIVE METABOLIC PANEL
ALT: 43 U/L (ref 0–44)
AST: 40 U/L (ref 15–41)
Albumin: 2.1 g/dL — ABNORMAL LOW (ref 3.5–5.0)
Alkaline Phosphatase: 51 U/L (ref 38–126)
Anion gap: 12 (ref 5–15)
BUN: 105 mg/dL — ABNORMAL HIGH (ref 8–23)
CO2: 25 mmol/L (ref 22–32)
Calcium: 7.7 mg/dL — ABNORMAL LOW (ref 8.9–10.3)
Chloride: 102 mmol/L (ref 98–111)
Creatinine, Ser: 4.04 mg/dL — ABNORMAL HIGH (ref 0.61–1.24)
GFR calc Af Amer: 17 mL/min — ABNORMAL LOW (ref >60)
GFR calc non Af Amer: 14 mL/min — ABNORMAL LOW (ref >60)
Glucose, Bld: 195 mg/dL — ABNORMAL HIGH (ref 70–99)
Potassium: 4.1 mmol/L (ref 3.5–5.1)
Sodium: 139 mmol/L (ref 135–145)
Total Bilirubin: 0.9 mg/dL (ref 0.3–1.2)
Total Protein: 3.7 g/dL — ABNORMAL LOW (ref 6.5–8.1)

## 2018-09-25 LAB — GLUCOSE, CAPILLARY
Glucose-Capillary: 163 mg/dL — ABNORMAL HIGH (ref 70–99)
Glucose-Capillary: 176 mg/dL — ABNORMAL HIGH (ref 70–99)
Glucose-Capillary: 183 mg/dL — ABNORMAL HIGH (ref 70–99)
Glucose-Capillary: 106 mg/dL — ABNORMAL HIGH (ref 70–99)
Glucose-Capillary: 168 mg/dL — ABNORMAL HIGH (ref 70–99)

## 2018-09-25 LAB — CBC WITH DIFFERENTIAL/PLATELET
Abs Immature Granulocytes: 0.1 10*3/uL — ABNORMAL HIGH (ref 0.00–0.07)
Basophils Absolute: 0 10*3/uL (ref 0.0–0.1)
Basophils Relative: 0 %
Eosinophils Absolute: 0.1 10*3/uL (ref 0.0–0.5)
Eosinophils Relative: 1 %
HCT: 26.1 % — ABNORMAL LOW (ref 39.0–52.0)
Hemoglobin: 8.3 g/dL — ABNORMAL LOW (ref 13.0–17.0)
Immature Granulocytes: 1 %
Lymphocytes Relative: 4 %
Lymphs Abs: 0.5 10*3/uL — ABNORMAL LOW (ref 0.7–4.0)
MCH: 32.8 pg (ref 26.0–34.0)
MCHC: 31.8 g/dL (ref 30.0–36.0)
MCV: 103.2 fL — ABNORMAL HIGH (ref 80.0–100.0)
Monocytes Absolute: 0.9 10*3/uL (ref 0.1–1.0)
Monocytes Relative: 8 %
Neutro Abs: 9.9 10*3/uL — ABNORMAL HIGH (ref 1.7–7.7)
Neutrophils Relative %: 86 %
Platelets: 31 10*3/uL — ABNORMAL LOW (ref 150–400)
RBC: 2.53 MIL/uL — ABNORMAL LOW (ref 4.22–5.81)
RDW: 23 % — ABNORMAL HIGH (ref 11.5–15.5)
WBC: 11.6 10*3/uL — ABNORMAL HIGH (ref 4.0–10.5)
nRBC: 0.4 % — ABNORMAL HIGH (ref 0.0–0.2)

## 2018-09-25 LAB — TYPE AND SCREEN
ABO/RH(D): A POS
Antibody Screen: NEGATIVE
Unit division: 0
Unit division: 0

## 2018-09-25 LAB — ADAMTS13 ACTIVITY: Adamts 13 Activity: 31.6 % — ABNORMAL LOW (ref >66.8)

## 2018-09-25 LAB — BPAM RBC
Blood Product Expiration Date: 202007012359
Blood Product Expiration Date: 202007012359
ISSUE DATE / TIME: 202006140946
ISSUE DATE / TIME: 202006141028
Unit Type and Rh: 6200
Unit Type and Rh: 6200

## 2018-09-25 LAB — PROTIME-INR
INR: 1.1 (ref 0.8–1.2)
Prothrombin Time: 14.5 seconds (ref 11.4–15.2)

## 2018-09-25 LAB — HEPARIN INDUCED PLATELET AB (HIT ANTIBODY): Heparin Induced Plt Ab: 0.08 OD (ref 0.000–0.400)

## 2018-09-25 LAB — LACTATE DEHYDROGENASE: LDH: 399 U/L — ABNORMAL HIGH (ref 98–192)

## 2018-09-25 LAB — ADAMTS13 ACTIVITY REFLEX

## 2018-09-25 MED ORDER — VITAL 1.5 CAL PO LIQD
1000.0000 mL | ORAL | Status: DC
  Administered 2018-09-25: 1000 mL
  Filled 2018-09-25 (×4): qty 1000

## 2018-09-25 MED ORDER — SODIUM CHLORIDE 0.9 % IV BOLUS
500.0000 mL | Freq: Once | INTRAVENOUS | Status: AC
  Administered 2018-09-25: 500 mL via INTRAVENOUS

## 2018-09-25 MED ORDER — HEPARIN SODIUM (PORCINE) 1000 UNIT/ML IJ SOLN
INTRAMUSCULAR | Status: AC
  Filled 2018-09-25: qty 3

## 2018-09-25 MED ORDER — METOPROLOL TARTRATE 5 MG/5ML IV SOLN
2.5000 mg | Freq: Once | INTRAVENOUS | Status: DC
  Filled 2018-09-25: qty 5

## 2018-09-25 MED ORDER — PRO-STAT SUGAR FREE PO LIQD
30.0000 mL | Freq: Three times a day (TID) | ORAL | Status: DC
  Administered 2018-09-25 – 2018-09-26 (×5): 30 mL
  Filled 2018-09-25 (×5): qty 30

## 2018-09-25 NOTE — Progress Notes (Addendum)
PROGRESS NOTE    [name] Rose  SNK:[id number] DOB: 11-03-1950 DOA: 09/08/2018 PCP: Shirline Frees, MD   Brief Narrative: [name] Rose is a 68 y.o. male who presented with respiratory failure and fever secondary to presumed pneumonia. He was started empirically with antibiotics. COVID-19 negative. He worsened requiring mechanical ventilation and CRRT for acute renal failure. He was successfully transfused with plans to start intermittent HD for continued renal failure. Course has also been complicated by anemia and atrial fibrillation with RVR.   Assessment & Plan:   Principal Problem:   Pneumonia Active Problems:   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 (HCC)   LFT elevation   AKI (acute kidney injury) ([place])   CKD (chronic kidney disease), stage III (HCC)   Acute respiratory failure with hypoxia (HCC)   Acute renal failure (HCC)   Pneumonia Treated empirically with antibiotics. COVID-19 negative x2. ID consulted and recommending discontinuation of antibiotics on 6/8.   Acute respiratory failure with hypoxia Unsure of etiology, but likely pneumonia. Required mechanical ventilation from 6/2 to 6/10. Steroids burst completed. Acute worsening secondary to below -Keep O2 saturations >92% -Wean to room air as able  Atrial fibrillation w/ RVR Paroxysmal atrial fibrillation on metoprolol and diltiazem as an outpatient. Recurrent episodes of RVR. Currently rate controlled. -Continue diltiazem -Cardiology consult/recommendations: started Flecainide   Hypotension Acutely worsened since giving metoprolol in addition to anemia. -Blood transfusion as mentioned below  Acute kidney injury on CKD stage 3 Baseline creatinine of 1.7-1.8. Creatinine of 2.3 on admission in setting of hypotension. Progressed to requiring CRRT while in the ICU. Nephrology on board. Now transitioning to IHD. -Nephrology recommendations: HD  Acute on chronic anemia Baseline  hemoglobin of 9-10. Down to 7.8 today. Unknown etiology. No bleeding noted. Hematology on board with concern for possible renal etiology vs possible BM etiology -CBC daily -Transfuse as needed; 2 units of blood ordered 6/14  Chronic thrombocytopenia Slightly worsened. Unknown etiology. -Hematology recommendations: BM biopsy planned  Essential hypertension Hypotension improved with resolution of RVR and transfusion. On diltiazem, metoprolol, losartan as an outpatient. -Continue diltiazem  Metastatic bladder cancer Currently on chemotherapy treatment. Follows with [name]. Last chemotherapy treatment was 5/18 per patient.  Dysphagia Severe aspiration risk per SLP evaluation on 6/12. Currently on tube feeds   DVT prophylaxis: SCDs Code Status:   Code Status: Full Code Family Communication: None Disposition Plan: Discharge pending further clinical improvement   Consultants:   PCCM  Nephrology  Cardiology  Palliative care  Procedures:   CRRT  Antimicrobials:  Vancomycin  Cefepime  Bactrim    Subjective: No concerns. Eager to eat.  Objective: Vitals:   09/25/18 1115 09/25/18 1131 09/25/18 1200 09/25/18 1230  BP: 132/76 131/77 126/77 118/72  Pulse: 82 79 87 98  Resp: 20 (!) 21 (!) 24 (!) 24  Temp: 97.9 F (36.6 C)     TempSrc: Oral     SpO2: 100%   100%  Weight:      Height:        Intake/Output Summary (Last 24 hours) at 09/25/2018 1246 Last data filed at 09/25/2018 9379 Gross per 24 hour  Intake 520 ml  Output 700 ml  Net -180 ml   Filed Weights   09/23/18 0359 09/24/18 0500 09/25/18 0500  Weight: 110.7 kg 116.2 kg 116.2 kg    Examination:  General exam: Appears calm and comfortable Respiratory system: Rales bilaterally Cardiovascular system: S1 & S2 heard, RRR. No murmurs, rubs, gallops or clicks. Gastrointestinal  system: Abdomen is nondistended, soft and nontender. No organomegaly or masses felt. Normal bowel sounds heard. Central  nervous system: Alert and oriented. No focal neurological deficits. Extremities: No calf tenderness Skin: No cyanosis. No rashes Psychiatry: Judgement and insight appear normal. Mood & affect appropriate.     Data Reviewed: I have personally reviewed following labs and imaging studies  CBC: Recent Labs  Lab 09/22/18 0320 09/23/18 0340 09/24/18 0512 09/24/18 1633 09/25/18 0517  WBC 12.8* 16.7* 13.1* 21.0* 11.6*  NEUTROABS 10.9* 15.0* 11.4*  --  9.9*  HGB 7.8* 7.9* 6.6* 8.5* 8.3*  HCT 25.1* 25.1* 21.4* 26.5* 26.1*  MCV 100.8* 104.1* 107.0* 101.5* 103.2*  PLT 50* 43* 36* 34* 31*   Basic Metabolic Panel: Recent Labs  Lab 09/21/18 0306 09/21/18 1539 09/22/18 0320 09/22/18 1102 09/22/18 1617 09/23/18 0340 09/24/18 0512 09/25/18 0517  NA 138 138 137  --  136 137 136 139  K 5.3* 4.8 4.5  --  4.2 3.9 3.8 4.1  CL 104 105 102  --  101 103 103 102  CO2 $Re'25 26 27  'ScP$ --  $R'25 26 24 25  'py$ GLUCOSE 107* 122* 138*  --  157* 183* 198* 195*  BUN 59* 49* 45*  --  40* 42* 77* 105*  CREATININE 1.62* 1.44* 1.55*  --  1.77* 1.60* 3.15* 4.04*  CALCIUM 8.4* 8.1* 8.4*  --  8.3* 8.1* 7.9* 7.7*  MG 2.4  --  2.6* 2.8* 2.6* 2.7*  --   --   PHOS 4.2 4.7*  --  5.7* 5.5*  5.5* 4.4  --   --    GFR: Estimated Creatinine Clearance: 24 mL/min (A) (by C-G formula based on SCr of 4.04 mg/dL (H)). Liver Function Tests: Recent Labs  Lab 09/22/18 0320 09/22/18 1617 09/23/18 0340 09/24/18 0512 09/25/18 0517  AST 72*  --  52* 41 40  ALT 71*  --  65* 48* 43  ALKPHOS 55  --  58 48 51  BILITOT 0.8  --  1.0 0.7 0.9  PROT 4.9*  --  5.1* 4.0* 3.7*  ALBUMIN 2.9* 2.8* 2.8* 2.2* 2.1*   No results for input(s): LIPASE, AMYLASE in the last 168 hours. No results for input(s): AMMONIA in the last 168 hours. Coagulation Profile: Recent Labs  Lab 09/25/18 1211  INR 1.1   Cardiac Enzymes: No results for input(s): CKTOTAL, CKMB, CKMBINDEX, TROPONINI in the last 168 hours. BNP (last 3 results) No results for  input(s): PROBNP in the last 8760 hours. HbA1C: No results for input(s): HGBA1C in the last 72 hours. CBG: Recent Labs  Lab 09/24/18 1126 09/24/18 1616 09/24/18 2319 09/25/18 0450 09/25/18 0821  GLUCAP 173* 230* 163* 176* 183*   Lipid Profile: No results for input(s): CHOL, HDL, LDLCALC, TRIG, CHOLHDL, LDLDIRECT in the last 72 hours. Thyroid Function Tests: No results for input(s): TSH, T4TOTAL, FREET4, T3FREE, THYROIDAB in the last 72 hours. Anemia Panel: No results for input(s): VITAMINB12, FOLATE, FERRITIN, TIBC, IRON, RETICCTPCT in the last 72 hours. Sepsis Labs: No results for input(s): PROCALCITON, LATICACIDVEN in the last 168 hours.  No results found for this or any previous visit (from the past 240 hour(s)).       Radiology Studies: Dg Chest Port 1 View  Result Date: 09/24/2018 CLINICAL DATA:  Pt sob, hx hypoxia, pneumonia, pt has 3 lines and an ng tube, dialysis Rt ij, rt port and left ij ? getting blood mk rt-r EXAM: PORTABLE CHEST 1 VIEW COMPARISON:  09/20/2018 FINDINGS: [place]  in the anterior chest wall with tip in distal SVC. LEFT and RIGHT central venous lines with tips in the mid SVC. Feeding tube with tip in the stomach. Normal cardiac silhouette. Bilateral fine airspace disease.  No pneumothorax IMPRESSION: 1. Stable support apparatus. 2. Mild pulmonary edema pattern. Electronically Signed   By: Suzy Bouchard M.D.   On: 09/24/2018 13:34        Scheduled Meds: . chlorhexidine  15 mL Mouth Rinse BID  . Chlorhexidine Gluconate Cloth  6 each Topical Daily  . diltiazem  30 mg Per Tube Q6H  . feeding supplement (PRO-STAT SUGAR FREE 64)  30 mL Per Tube TID  . flecainide  50 mg Oral Q12H  . gabapentin  300 mg Per Tube Q12H  . insulin aspart  0-9 Units Subcutaneous Q4H  . mouth rinse  15 mL Mouth Rinse q12n4p  . pantoprazole sodium  40 mg Per Tube Q24H  . vitamin B-6  300 mg Per Tube Daily  . sodium chloride flush  3 mL Intravenous Q12H   Continuous  Infusions: . sodium chloride    . feeding supplement (VITAL 1.5 CAL)       LOS: 17 days     Cordelia Poche, MD [facility] 09/25/2018, 12:46 PM  If 7PM-7AM, please contact night-coverage [email or web address]

## 2018-09-25 NOTE — Plan of Care (Signed)
  Problem: Education: Goal: Knowledge of General Education information will improve Description: Including pain rating scale, medication(s)/side effects and non-pharmacologic comfort measures Outcome: Progressing   Problem: Health Behavior/Discharge Planning: Goal: Ability to manage health-related needs will improve Outcome: Progressing   Problem: Clinical Measurements: Goal: Ability to maintain clinical measurements within normal limits will improve Outcome: Progressing Goal: Will remain free from infection Outcome: Progressing Goal: Diagnostic test results will improve Outcome: Progressing Goal: Respiratory complications will improve Outcome: Progressing Goal: Cardiovascular complication will be avoided Outcome: Progressing   Problem: Activity: Goal: Risk for activity intolerance will decrease Outcome: Progressing   Problem: Nutrition: Goal: Adequate nutrition will be maintained Outcome: Progressing   Problem: Coping: Goal: Level of anxiety will decrease Outcome: Progressing   Problem: Elimination: Goal: Will not experience complications related to bowel motility Outcome: Progressing Goal: Will not experience complications related to urinary retention Outcome: Progressing   Problem: Safety: Goal: Ability to remain free from injury will improve Outcome: Progressing   Problem: Skin Integrity: Goal: Risk for impaired skin integrity will decrease Outcome: Progressing   Problem: Activity: Goal: Ability to tolerate increased activity will improve Outcome: Progressing   Problem: Clinical Measurements: Goal: Ability to maintain a body temperature in the normal range will improve Outcome: Progressing   Problem: Respiratory: Goal: Ability to maintain adequate ventilation will improve Outcome: Progressing Goal: Ability to maintain a clear airway will improve Outcome: Progressing

## 2018-09-25 NOTE — Procedures (Signed)
I was present at this session.  I have reviewed the session itself and made appropriate changes.HD via tmep cath. Flow 400.  bp low 100s. [name] Rose 6/15/202012:29 PM

## 2018-09-25 NOTE — Progress Notes (Signed)
Progress Note  Patient Name: [name] Rose Date of Encounter: 09/25/2018  Primary Cardiologist: Pixie Casino, MD   Subjective   Breathing is OK   NO CP    Inpatient Medications    Scheduled Meds: . chlorhexidine  15 mL Mouth Rinse BID  . Chlorhexidine Gluconate Cloth  6 each Topical Daily  . diltiazem  30 mg Per Tube Q6H  . feeding supplement (PRO-STAT SUGAR FREE 64)  60 mL Per Tube BID  . flecainide  50 mg Oral Q12H  . gabapentin  300 mg Per Tube Q12H  . insulin aspart  0-9 Units Subcutaneous Q4H  . mouth rinse  15 mL Mouth Rinse q12n4p  . pantoprazole sodium  40 mg Per Tube Q24H  . vitamin B-6  300 mg Per Tube Daily  . sodium chloride flush  3 mL Intravenous Q12H   Continuous Infusions: . sodium chloride    . feeding supplement (VITAL AF 1.2 CAL) 1,000 mL (09/25/18 0502)   PRN Meds: sodium chloride, acetaminophen **OR** acetaminophen, albuterol, bisacodyl, labetalol, metoprolol tartrate, [DISCONTINUED] ondansetron **OR** ondansetron (ZOFRAN) IV, sennosides, sodium chloride flush, sodium chloride flush, zolpidem   Vital Signs    Vitals:   09/24/18 2059 09/24/18 2100 09/25/18 0300 09/25/18 0500  BP: 100/61 108/70 104/63   Pulse: 96 99 85   Resp: 20 (!) 23 16   Temp: 98.3 F (36.8 C)  97.8 F (36.6 C)   TempSrc: Oral  Oral   SpO2:  100% 100%   Weight:    116.2 kg  Height:        Intake/Output Summary (Last 24 hours) at 09/25/2018 0724 Last data filed at 09/24/2018 1700 Gross per 24 hour  Intake 640 ml  Output 550 ml  Net 90 ml   Last 3 Weights 09/25/2018 09/24/2018 09/23/2018  Weight (lbs) 256 lb 2.8 oz 256 lb 2.8 oz 244 lb 0.8 oz  Weight (kg) 116.2 kg 116.2 kg 110.7 kg      Telemetry    Now SR   - Personally Reviewed  ECG    Not done  - Personally Reviewed  Physical Exam   GEN: No acute distress.   Neck: Difficult to assess JVP due to lines   Cardiac: RRR, no murmurs, rubs, or gallops.  Respiratory: bilateral rhonchi   GI: Soft,  nontender, non-distended  MS:  Tr edema; No deformity. Neuro:  Nonfocal  Psych: Normal affect   Labs    Chemistry Recent Labs  Lab 09/23/18 0340 09/24/18 0512 09/25/18 0517  NA 137 136 139  K 3.9 3.8 4.1  CL 103 103 102  CO2 $Re'26 24 25  'JgG$ GLUCOSE 183* 198* 195*  BUN 42* 77* 105*  CREATININE 1.60* 3.15* 4.04*  CALCIUM 8.1* 7.9* 7.7*  PROT 5.1* 4.0* 3.7*  ALBUMIN 2.8* 2.2* 2.1*  AST 52* 41 40  ALT 65* 48* 43  ALKPHOS 58 48 51  BILITOT 1.0 0.7 0.9  GFRNONAA 44* 19* 14*  GFRAA 51* 22* 17*  ANIONGAP $RemoveB'8 9 12     'fvxHfHLe$ Hematology Recent Labs  Lab 09/24/18 0512 09/24/18 1633 09/25/18 0517  WBC 13.1* 21.0* 11.6*  RBC 2.00* 2.61* 2.53*  HGB 6.6* 8.5* 8.3*  HCT 21.4* 26.5* 26.1*  MCV 107.0* 101.5* 103.2*  MCH 33.0 32.6 32.8  MCHC 30.8 32.1 31.8  RDW 24.1* 22.2* 23.0*  PLT 36* 34* 31*    Cardiac EnzymesNo results for input(s): TROPONINI in the last 168 hours. No results for input(s): TROPIPOC in the  last 168 hours.   BNPNo results for input(s): BNP, PROBNP in the last 168 hours.   DDimer No results for input(s): DDIMER in the last 168 hours.   Radiology    Dg Chest Port 1 View  Result Date: 09/24/2018 CLINICAL DATA:  Pt sob, hx hypoxia, pneumonia, pt has 3 lines and an ng tube, dialysis Rt ij, rt port and left ij ? getting blood mk rt-r EXAM: PORTABLE CHEST 1 VIEW COMPARISON:  09/20/2018 FINDINGS: Port in the anterior chest wall with tip in distal SVC. LEFT and RIGHT central venous lines with tips in the mid SVC. Feeding tube with tip in the stomach. Normal cardiac silhouette. Bilateral fine airspace disease.  No pneumothorax IMPRESSION: 1. Stable support apparatus. 2. Mild pulmonary edema pattern. Electronically Signed   By: Suzy Bouchard M.D.   On: 09/24/2018 13:34    Cardiac Studies  Relevant CV Studies: Echo 06/22/18 IMPRESSIONS   1. The left ventricle has normal systolic function, with an ejection fraction of 55-60%. The cavity size was normal. Left ventricular  diastolic Doppler parameters are consistent with impaired relaxation. 2. The right ventricle has normal systolic function. The cavity was mildly enlarged. There is no increase in right ventricular wall thickness. 3. Right atrial size was mildly dilated. 4. Moderate thickening of the aortic valve Mild calcification of the aortic valve. 5. There is dilatation of the aortic root measuring 39 mm. 6. No vegetations detected.  FINDINGS Left Ventricle: The left ventricle has normal systolic function, with an ejection fraction of 55-60%. The cavity size was normal. There is no increase in left ventricular wall thickness. Left ventricular diastolic Doppler parameters are consistent with  impaired relaxation. Definity contrast agent was given IV to delineate the left ventricular endocardial borders. Right Ventricle: The right ventricle has normal systolic function. The cavity was mildly enlarged. There is no increase in right ventricular wall thickness. Left Atrium: left atrial size was normal in size Right Atrium: right atrial size was mildly dilated. Right atrial pressure is estimated at 3 mmHg. Interatrial Septum: No atrial level shunt detected by color flow Doppler. Pericardium: There is no evidence of pericardial effusion. Mitral Valve: The mitral valve is normal in structure. Mitral valve regurgitation was not assessed by color flow Doppler. Tricuspid Valve: The tricuspid valve is normal in structure. Tricuspid valve regurgitation was not visualized by color flow Doppler. Aortic Valve: The aortic valve is normal in structure. Moderate thickening of the aortic valve Mild calcification of the aortic valve. Aortic valve regurgitation was not visualized by color flow Doppler. Pulmonic Valve: The pulmonic valve was normal in structure. Pulmonic valve regurgitation is not visualized by color flow Doppler. Aorta: There is dilatation of the aortic root measuring 39 mm. Venous: The inferior vena cava  is normal in size with greater than 50% respiratory variability.  TEE 06/27/18 IMPRESSIONS   1. The left ventricle has normal systolic function, with an ejection fraction of 55-60%. 2. Moderate mitral regurgitation. 3. No valvular vegetations seen. 4. 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  FINDINGS Left Ventricle: The left ventricle has normal systolic function, with an ejection fraction of 55-60%. Right Ventricle: The right ventricle has normal systolic function. Left Atrium: Left atrial size was normal in size. Right Atrium: Right atrial size was normal in size. Interatrial Septum: No atrial level shunt detected by color flow Doppler. Mitral Valve: The mitral valve is grossly normal. Mitral valve regurgitation is moderate by color flow Doppler. There is no evidence of mitral  valve vegetation. Tricuspid Valve: The tricuspid valve was grossly normal. Tricuspid valve regurgitation is trivial by color flow Doppler. No TV vegetation was visualized. Aortic Valve: The aortic valve is tricuspid Mild thickening of the aortic valve. There is no stenosis of the aortic valve. There is no evidence of a vegetation on the aortic valve. Pulmonic Valve: The pulmonic valve was grossly normal. Pulmonic valve regurgitation is not visualized by color flow Doppler. Aorta: The ascending aorta is normal in size and structure.  LV Wall Scoring:  MR Peak grad: 109.4 mmHg MR Mean grad: 74.0 mmHg MR Vmax: 523.00 cm/s MR Vmean: 398.0 cm/s   Manish Patwardhan MD Electronically signed by Vernell Leep MD Signature Date/Time: 06/27/2018/5:27:15 PM  Patient Profile     STEFANO TRULSON is a 68 y.o. male with a hx of metastatic bladder cancer and hx of sepsis and MSSA bacteremia, PAF with hospitalization but could not be anticoagulated due anemia and thrombocytopenia (chemo) and rate control alone was planned, not rhythm control, mod MR, on TEE and no endocarditis that  admit who is being seen today for the evaluation of atria fib RVR at the request of Dr. Lonny Prude  Assessment & Plan    1  PAF with RVR    Pts CHADS2VASc is 3  Back in SR  Keep on low dose flecanide  And dilt    Not on anticoag with thrombocytopenia.    3  Thrombocytopenia  Plt 31K     4 Anemia  Hgb 8.3    5  Pulmonary  Pt down to 4 L [place]     6  CKD  For dialysis today  Should help with volume     7   CAD   As found on CTA  For questions or updates, please contact [facility] Please consult [email or web address] for contact info under        Signed, Dorris Carnes, MD  09/25/2018, 7:24 AM

## 2018-09-25 NOTE — Consult Note (Signed)
   [facility] CM Inpatient Consult   09/25/2018  [name] Rose 1951/03/20 [id number]    Patient was handed off by [facility] hospital liaison at [facility] as he was transferred to [place] on 6/13 (extubated on 6/10- in ICU). Patient screened for49% extreme high risk score ofunplanned readmission and 2 hospitalizationsin the past 6 months, under [employer] plan.  Per chart review and history & physical dated 08/13/18, show as follows: [name] Rose is a 68 y.o. male with medical history significant of hypertension, hyperlipidemia, prediabetes, prostate cancer, metastasized bladder cancer on chemotherapy, PAF, CKD stage III, MSSA bacteremia.     Patient presented secondary to significant weakness overnight,  also with respiratory failure and fever secondary to presumed pneumonia. He was started empirically with antibiotics. COVID-19 negative. He worsened requiring mechanical ventilation and CRRT for acute renal failure. He was successfully transfused with plans to start intermittent HD for continued renal failure. Course has also been complicated by anemia and atrial fibrillation with RVR.  (Pneumonia, acute respiratory failure with hypoxia, acute renal failure, anemia, atrial-fib)   Primary care provider is Dr. Shirline Frees with [facility], listed as providing transition of care follow-up.  Notedthat patient is currently listed asan [facility]aged [facility] patient. Hewill be followed by [facility] in the community,with full case management services.  [facility] care management services are not appropriate at this time.  Will sign off.   For questions and additional information, please contact:  Leshia Kope A. Theodosia Bahena, BSN, RN-BC [facility] Cell: [phone]

## 2018-09-25 NOTE — Progress Notes (Signed)
  Speech Language Pathology Treatment: Dysphagia  Patient Details Name: ZAKIAH BECKERMAN MRN: [id number] DOB: [birth year] Today's Date: 09/25/2018 Time: 0301-3143 SLP Time Calculation (min) (ACUTE ONLY): 14 min  Assessment / Plan / Recommendation Clinical Impression  Pt's overall demeanor appears to be improved compared to initial evaluation, as he is alert and responding appropriately to questioning and commands. His voice sounds clear to SLP, although he describes it as "a little weak" still. He consumed a variety of textures with intermittent throat clearing noted before, during, and after intake. No coughing was elicited even when SLP administered the 3 ounce water challenge, which suggests low likelihood for aspiration. He does also describe occasionally having trouble with pills PTA, feeling like they would get "stuck," which may suggest a baseline esophageal component that could also be contributing to his throat clearing. Recommend starting Dys 3 diet for energy conservation, along with thin liquids and meds whole in puree, offered one at a time. Pills could be crush PRN - especially if he has any larger ones. SLP reviewed esophageal and aspiration precautions. Will f/u for tolerance and potential to advance solids further.   HPI HPI: pt is a 68 yo male adm to [facility] with respiratory difficulties- diagnosed with pna and developed renal failure.  PMH + for prostate cancer with bladder mets, chemo induced mucositis, - he required intubation and is on CRRT at this time.  Pt intubated 6/2-6/01/2019.    Pt has been on chemo and is hyperglycemic due to steroids. Swallow eval ordered.  Pt had OG when intubated but it was removed with extubation.      SLP Plan  Continue with current plan of care       Recommendations  Diet recommendations: Dysphagia 3 (mechanical soft);Thin liquid Liquids provided via: Cup;Straw Medication Administration: Whole meds with puree(crush PRN) Supervision: Staff to  assist with self feeding Compensations: Slow rate;Small sips/bites;Follow solids with liquid Postural Changes and/or Swallow Maneuvers: Seated upright 90 degrees;Upright 30-60 min after meal                Oral Care Recommendations: Oral care BID Follow up Recommendations: (tba) SLP Visit Diagnosis: Dysphagia, unspecified (R13.10) Plan: Continue with current plan of care       GO                Venita Sheffield Corion Sherrod 09/25/2018, 9:52 AM  Pollyann Glen, M.A. [facility] Acute Environmental education officer [phone] Office [phone]

## 2018-09-25 NOTE — Progress Notes (Signed)
Unfortunately, his platelet count is still trending downward.  I think we are going to have to do a bone marrow test on him to make sure there is nothing going on with the bone marrow that could reflect underlying malignancy invading the bone marrow.  This would be very unusual for bladder cancer but certainly not unheard of.  I talked to him about this.  Probably would not get set up for another couple days.  I am sure radiology will do their best to try to do this expeditiously.  He clearly will need hemodialysis.  His BUN is 105 with a creatinine now over 4.  He has had no obvious bleeding.  There is no fever.  His hemoglobin is 8.3.  Again, he has a low erythropoietin level.  We will try another dose of Aranesp on him today.  He has a tube feeds in.  Hopefully, he will be able to eat soon.  I know that speech pathology is working on him for this.  His vital signs all look pretty stable.  Temperature 97.8.  Pulse 85.  Blood pressure 104/63.  His lungs sound pretty clear.  Cardiac exam regular rate and rhythm with no murmurs.  Abdomen is soft.  He has slightly decreased bowel sounds.  There is no guarding or rebound tenderness.  Extremities shows no clubbing, cyanosis or edema.  [name] Rose has recovered from his respiratory failure.  He is now has persistent renal failure.  Hopefully, his kidneys will come back.  I still am not sure as to what the actual trigger is for all this.  We will see about the bone marrow biopsy.  He does not need any platelets.  I am not sure he needs any transfusions right now.  Again we will give him a dose of Aranesp.  I appreciate the great care that he is getting from everybody in the stepdown unit.  [name], MD  [place] 36:26

## 2018-09-25 NOTE — Progress Notes (Signed)
Subjective: Interval History: has no complaint .  Objective: Vital signs in last 24 hours: Temp:  [97.7 F (36.5 C)-99.6 F (37.6 C)] 97.8 F (36.6 C) (06/15 0300) Pulse Rate:  [85-199] 85 (06/15 0300) Resp:  [16-41] 16 (06/15 0300) BP: (77-121)/(49-77) 104/63 (06/15 0300) SpO2:  [91 %-100 %] 100 % (06/15 0300) Weight:  [116.2 kg] 116.2 kg (06/15 0500) Weight change: 0 kg  Intake/Output from previous day: 06/14 0701 - 06/15 0700 In: 640 [I.V.:10; Blood:630] Out: 550 [Urine:550] Intake/Output this shift: No intake/output data recorded.  General appearance: alert, cooperative, no distress, moderately obese and pale Resp: rales bilaterally and rhonchi bilaterally Cardio: regular rate and rhythm and systolic murmur: systolic ejection 2/6, crescendo and decrescendo at 2nd left intercostal space GI: obese, pos bs, liver down 5 cm Extremities: edema 3-4+  RIJ Temp cath  Lab Results: Recent Labs    09/24/18 1633 09/25/18 0517  WBC 21.0* 11.6*  HGB 8.5* 8.3*  HCT 26.5* 26.1*  PLT 34* 31*   BMET:  Recent Labs    09/24/18 0512 09/25/18 0517  NA 136 139  K 3.8 4.1  CL 103 102  CO2 24 25  GLUCOSE 198* 195*  BUN 77* 105*  CREATININE 3.15* 4.04*  CALCIUM 7.9* 7.7*   No results for input(s): PTH in the last 72 hours. Iron Studies: No results for input(s): IRON, TIBC, TRANSFERRIN, FERRITIN in the last 72 hours.  Studies/Results: Dg Chest Port 1 View  Result Date: 09/24/2018 CLINICAL DATA:  Pt sob, hx hypoxia, pneumonia, pt has 3 lines and an ng tube, dialysis Rt ij, rt port and left ij ? getting blood mk rt-r EXAM: PORTABLE CHEST 1 VIEW COMPARISON:  09/20/2018 FINDINGS: Port in the anterior chest wall with tip in distal SVC. LEFT and RIGHT central venous lines with tips in the mid SVC. Feeding tube with tip in the stomach. Normal cardiac silhouette. Bilateral fine airspace disease.  No pneumothorax IMPRESSION: 1. Stable support apparatus. 2. Mild pulmonary edema pattern.  Electronically Signed   By: Suzy Bouchard M.D.   On: 09/24/2018 13:34    I have reviewed the patient's current medications.  Assessment/Plan: 1 CKD3/AKI presumably shock.   Little urine. Vol xs. Will plan HD today  2 Anemia TX yest 3 low PTLT per Onc.  4 Pneu/fluid xs. 5 Obesity 6 DM 7 Bladder Ca P HD, lower vol, follow Ptlt.   LOS: 17 days   Jeneen Rinks Srihith Aquilino 09/25/2018,7:01 AM

## 2018-09-25 NOTE — Consult Note (Signed)
Consultation Note Date: 09/25/2018   Patient Name: [name] Rose  DOB: 1950/08/29  MRN: [id number]  Age / Sex: 68 y.o., male  PCP: [name] Frees, MD Referring Physician: Mariel Aloe, MD  Reason for Consultation: Establishing goals of care and Psychosocial/spiritual support  HPI/Patient Profile: 68 y.o. male  with past medical history of stage 4 bladder cancer, atrial fibrillation  who was admitted on 09/08/2018 with respiratory failure and fever secondary to pneumonia.  He worsened requiring mechanical ventilation and continuous renal replacement therapy.  He has developed anemia severe thrombocyopenia with no apparent bleeding.  Platelets are currently 31K.  He is now starting intermittent HD, and Dr. Marin Rose has requested a bone marrow biopsy.  PMT was consulted for [name] Rose.   He is a full code.  Clinical Assessment and Goals of Care:  I have reviewed medical records including EPIC notes, labs and imaging, discussed the patient with Dr. Lonny Rose, and spoke on the phone with his wife and daughter to discuss diagnosis prognosis, [name] Rose, EOL wishes, disposition and options.  Unfortunately [name] Rose was in hemodialysis this afternoon so I wont be able to speak with him until 6/16.  I introduced Palliative Medicine as specialized medical care for people living with serious illness. It focuses on providing relief from the symptoms and stress of a serious illness.   We discussed a brief life review of the patient. [name] Rose describes [name] Rose as an easy going guy who does have some passionate opinions.  He is a big sports fan and loves to play golf and hockey.  He played Hockey in a [employer] as an adult.  He is from [place] and is a big fan of the [place].  He also enjoys gardening and yard work.  His career was managing a Water quality scientist.  He is not a "spiritual" man.  We discussed his  current illness and what it means in the larger context of her on-going co-morbidities. [name] Rose and [name] Rose were both quite surprised at how seriously ill [name] Rose is currently.  We discussed his stage 4 bladder cancer, acute on chronic renal failure (currently on hemodialysis) and anemia/thrombocytopenia.  I explained the next step with regard to his work up will be a bone marrow biopsy.    [name] Rose mentioned when [name] Rose was hospitalized in March he had multiple blood transfusions.  She did not think about the implications of why they were needed - she just did what the doctors suggested.  I attempted to elicit values and goals of care important to the patient.  [name] Rose has a Living Will that was done in 2016 that his wife confirmed is still appropriate.  It states that [name] Rose is the [name] Rose (dtr) is the back up.  It also states that if he has a terminal/incurable illness and is close to death he would not want any extreme measures or artificial feeding.  His wife asked - I'm just not certain when that comes into play.  I explained that at this  point if [name] Rose were to code it does not seem safe to  Do chest compressions as he would likely have severe internal hemorrhaging and consequently CPR would further hasten his death.  All of [place] agreed that it is important to have conversation with [name] Rose.  We agreed to talk again tomorrow at 10:00 together with [name] Rose.    Primary Decision Maker:  PATIENT  Wife [name] Rose is HCPOA.    SUMMARY OF RECOMMENDATIONS     Wife would appreciate updates from the MDs involved in [name] Rose care.\  PMT will follow along to support the family in [place] decisions.  At this point there seem to be as many questions as there are answers.  Bone marrow biopsy results will help.  We will wait and see if his kidney's will recover or if he will require on-going hemodialysis.  Follow up appt at 10:00 am on 6/16.  Code Status/Advance Care Planning:  Full code    Psycho-social/Spiritual:   Desire for further Chaplaincy support:  Not at this time.  Prognosis:  Unable to tell.  Waiting for work up results.  At this point he is at high risk for decompensation or death given declining platelets, recurrent anemia, stage 4 bladder cancer and acute on chronic renal failure.   Discharge Planning: To Be Determined.  Possibly Home with home health and palliative vs home with hospice.      Primary Diagnoses: Present on Admission: . Pneumonia . AKI (acute kidney injury) ([place]) . LFT elevation . Acute respiratory failure with hypoxia ([place]) . 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 The following aspects are pertinent.  Past Medical History:  Diagnosis Date  . Arthritis   . 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 Bladder tumor   . Essential hypertension 06/23/2018  . Goals of care, counseling/discussion 09/30/2016  . History of prostate cancer followed by pcp dr [name]-  per pt last PSA undetectable   dx 2008-- (Stage T1c, Gleason 3+3,  PSA 4.58, vol 99cc)  s/p  radical prostatectomy (nerve sparing bilateral)   . Hyperglycemia 06/23/2018  . Hypertension   . Lower urinary tract symptoms (LUTS)   . Mild hyperlipidemia 06/23/2018  . Pre-diabetes   . Wears glasses    Social History   Socioeconomic History  . Marital status: Married    Spouse name: Not on file  . Number of children: Not on file  . Years of education: Not on file  . Highest education level: Not on file  Occupational History  . Not on file  Social Needs  . Financial resource strain: Not on file  . Food insecurity    Worry: Not on file    Inability: Not on file  . Transportation needs    Medical: Not on file    Non-medical: Not on file  Tobacco Use  . Smoking status: Former Smoker    Years: 16.00    Types:  Cigarettes    Quit date: 09/25/1984    Years since quitting: 34.0  . Smokeless tobacco: Never Used  Substance and Sexual Activity  . Alcohol use: Yes    Comment: occasionally  . Drug use: No  . Sexual activity: Not on file  Lifestyle  . Physical activity    Days per week: Not on file    Minutes per session: Not on file  . Stress: Not on file  Relationships  . Social Herbalist on phone: Not on file    Gets together: Not on file    Attends religious service: Not on file    Active member of club or organization: Not on file    Attends meetings of clubs or organizations: Not on file    Relationship status: Not on file  Other Topics Concern  . Not on file  Social History Narrative  . Not on file   Family History  Problem Relation Age of Onset  . Hypertension Mother   . Aneurysm Mother   . Emphysema Father   . Hypertension Father    Scheduled Meds: . chlorhexidine  15 mL Mouth Rinse BID  . Chlorhexidine Gluconate Cloth  6 each Topical Daily  . diltiazem  30 mg Per Tube Q6H  . feeding supplement (PRO-STAT SUGAR FREE 64)  30 mL Per Tube TID  . flecainide  50 mg Oral Q12H  . gabapentin  300 mg Per Tube Q12H  . heparin      . insulin aspart  0-9 Units Subcutaneous Q4H  . mouth rinse  15 mL Mouth Rinse q12n4p  . pantoprazole sodium  40 mg Per Tube Q24H  . vitamin B-6  300 mg Per Tube Daily  . sodium chloride flush  3 mL Intravenous Q12H   Continuous Infusions: . sodium chloride    . feeding supplement (VITAL 1.5 CAL)     PRN Meds:.sodium chloride, acetaminophen **OR** acetaminophen, albuterol, bisacodyl, labetalol, metoprolol tartrate, [DISCONTINUED] ondansetron **OR** ondansetron (ZOFRAN) IV, sennosides, sodium chloride flush, sodium chloride flush, zolpidem Allergies  Allergen Reactions  . Heparin     Physical Exam not examined today.  Vital Signs: BP 118/78   Pulse 100   Temp 97.9 F (36.6 C) (Oral)   Resp (!) 45   Ht _0  (1.88 m)   Wt 116.2 kg    SpO2 100%   BMI 32.89 kg/m  Pain Scale: 0-10 POSS *See Group Information*: 1-Acceptable,Awake and alert Pain Score: 0-No pain   SpO2: SpO2: 100 % O2 Device:SpO2: 100 % O2 Flow Rate: .O2 Flow Rate (L/min): 4 L/min  IO: Intake/output summary:   Intake/Output Summary (Last 24 hours) at 09/25/2018 1530 Last data filed at 09/25/2018 6269 Gross per 24 hour  Intake 195 ml  Output 700 ml  Net -505 ml    LBM: Last BM Date: 09/25/18 Baseline Weight: Weight: 113.4 kg Most recent weight: Weight: 116.2 kg     Palliative Assessment/Data:  40%     Time In: 3:00 Time Out: 4:00 Time Total: 60 min. Visit consisted of counseling and education dealing with the complex and emotionally intense issues surrounding the need for palliative care and symptom management in the setting of serious and potentially life-threatening illness. Greater than 50%  of this time was spent counseling and coordinating care related to the above assessment and plan.  Signed by: Florentina Jenny, PA-C Palliative Medicine Pager: [phone]  Please contact Palliative Medicine Team phone at [phone] for questions and concerns.  For individual provider: See Shea Evans

## 2018-09-25 NOTE — Consult Note (Signed)
Chief Complaint: Patient was seen in consultation today for bone marrow biopsy Chief Complaint  Patient presents with   Other    generalized weakness   at the request of Dr Pearletha Alfred   Supervising Physician: Markus Daft  Patient Status: [facility] - In-pt  History of Present Illness: [name] Rose is a 69 y.o. male   Thrombocytopenia Hx Bladder cancer Developed Afib in house  Renal failure-- AKI; CKD Now in dialysis Respiratory failure- adm 5/29--- resolving Covid neg x 2  Worsening plt ct Request for Bone marrow biopsy per Dr Marin Olp Planned for 6/16 am   Past Medical History:  Diagnosis Date   Arthritis    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 ([place]) 09/29/2016   Bladder tumor    Essential hypertension 06/23/2018   Goals of care, counseling/discussion 09/30/2016   History of prostate cancer followed by pcp dr [name]-  per pt last PSA undetectable   dx 2008-- (Stage T1c, Gleason 3+3,  PSA 4.58, vol 99cc)  s/p  radical prostatectomy (nerve sparing bilateral)    Hyperglycemia 06/23/2018   Hypertension    Lower urinary tract symptoms (LUTS)    Mild hyperlipidemia 06/23/2018   Pre-diabetes    Wears glasses     Past Surgical History:  Procedure Laterality Date   CATARACT EXTRACTION W/ INTRAOCULAR LENS  IMPLANT, BILATERAL Bilateral 2011   [place]   IR FLUORO GUIDE PORT INSERTION RIGHT  10/07/2016   IR RADIOLOGIST EVAL & MGMT  01/05/2018   IR US GUIDE VASC ACCESS RIGHT  10/07/2016   KNEE ARTHROSCOPY Bilateral right 2006;  left 02-15-2007   [place];  1990;  [place]  06/20/2006   bilateral nerve sparing   TEE WITHOUT CARDIOVERSION N/A 06/27/2018   Procedure: TRANSESOPHAGEAL ECHOCARDIOGRAM (TEE);  Surgeon: Nigel Mormon, MD;  Location: [facility] ENDOSCOPY;  Service: Cardiovascular;  Laterality: N/A;   TOTAL HIP ARTHROPLASTY Left 03/03/2015   Procedure: LEFT TOTAL HIP ARTHROPLASTY ANTERIOR APPROACH;  Surgeon: Dorna Leitz, MD;  Location: [place];  Service: Orthopedics;  Laterality: Left;   TOTAL KNEE ARTHROPLASTY Bilateral left 08-13-2009;  right 12-26-2009   TRANSURETHRAL RESECTION OF BLADDER TUMOR N/A 09/20/2016   Procedure: TRANSURETHRAL RESECTION OF BLADDER TUMOR (TURBT);  Surgeon: Franchot Gallo, MD;  Location: [facility];  Service: Urology;  Laterality: N/A;    Allergies: Heparin  Medications: Prior to Admission medications   Medication Sig Start Date End Date Taking? Authorizing Provider  Cinnamon 500 MG TABS Take 1 tablet by mouth 2 (two) times daily.    Yes [provider]  dexamethasone (DECADRON) 4 MG tablet Take 2 tablets (8 mg total) by mouth daily. Start the day after chemotherapy for 2 days. 07/17/18  Yes Volanda Napoleon, MD  diltiazem (CARDIZEM CD) 240 MG 24 hr capsule Take 1 capsule (240 mg total) by mouth daily. 06/28/18  Yes Purohit, Konrad Dolores, MD  gabapentin (NEURONTIN) 400 MG capsule TAKE 1 CAPSULE (400 MG TOTAL) BY MOUTH 4 (FOUR) TIMES DAILY. 09/05/18  Yes Volanda Napoleon, MD  lidocaine-prilocaine (EMLA) cream Apply to affected area once 07/17/18  Yes Ennever, Rudell Cobb, MD  LORazepam (ATIVAN) 0.5 MG tablet TAKE 1 TABLET BY MOUTH EVERY 6 HOURS AS NEEDED FOR NAUSEA AND VOMITING Patient taking differently: Take 0.5 mg by mouth every 6 (six) hours as needed (nausea).  08/30/18  Yes [place], Holli Humbles, NP  losartan (COZAAR) 100 MG tablet  Take 100 mg by mouth daily.   Yes [provider]  MAGNESIUM PO Take 400 mg by mouth at bedtime. Leg cramps   Yes [provider]  metoprolol tartrate (LOPRESSOR) 25 MG tablet Take 1 tablet (25 mg total) by mouth 2 (two) times daily. 06/28/18  Yes Purohit, Konrad Dolores, MD  ondansetron (ZOFRAN) 8 MG tablet Take 1 tablet (8 mg total) by mouth 2 (two) times daily as needed for refractory nausea / vomiting. Start on day 3 after chemo. 07/17/18  Yes  Volanda Napoleon, MD  prochlorperazine (COMPAZINE) 10 MG tablet Take 1 tablet (10 mg total) by mouth every 6 (six) hours as needed (Nausea or vomiting). 07/17/18  Yes Ennever, Rudell Cobb, MD  Pyridoxine HCl (VITAMIN B-6) 250 MG tablet Take 1 tablet (250 mg total) by mouth daily. 12/23/17  Yes Aline August, MD  senna (SENOKOT) 8.6 MG TABS tablet Take 1 tablet (8.6 mg total) by mouth 2 (two) times daily. 12/23/17   Aline August, MD     Family History  Problem Relation Age of Onset   Hypertension Mother    Aneurysm Mother    Emphysema Father    Hypertension Father     Social History   Socioeconomic History   Marital status: Married    Spouse name: Not on file   Number of children: Not on file   Years of education: Not on file   Highest education level: Not on file  Occupational History   Not on file  Social Needs   Financial resource strain: Not on file   Food insecurity    Worry: Not on file    Inability: Not on file   Transportation needs    Medical: Not on file    Non-medical: Not on file  Tobacco Use   Smoking status: Former Smoker    Years: 16.00    Types: Cigarettes    Quit date: 09/25/1984    Years since quitting: 34.0   Smokeless tobacco: Never Used  Substance and Sexual Activity   Alcohol use: Yes    Comment: occasionally   Drug use: No   Sexual activity: Not on file  Lifestyle   Physical activity    Days per week: Not on file    Minutes per session: Not on file   Stress: Not on file  Relationships   Social connections    Talks on phone: Not on file    Gets together: Not on file    Attends religious service: Not on file    Active member of club or organization: Not on file    Attends meetings of clubs or organizations: Not on file    Relationship status: Not on file  Other Topics Concern   Not on file  Social History Narrative   Not on file     Review of Systems: A 12 point ROS discussed and pertinent positives are indicated in  the HPI above.  All other systems are negative.  Review of Systems  Constitutional: Positive for activity change, appetite change and fatigue. Negative for fever.  Respiratory: Positive for shortness of breath.   Cardiovascular: Negative for chest pain.  Gastrointestinal: Negative for abdominal pain.  Neurological: Positive for weakness.  Psychiatric/Behavioral: Negative for behavioral problems and confusion.    Vital Signs: BP 132/73 (BP Location: Left Arm)    Pulse 81    Temp 97.7 F (36.5 C) (Oral)    Resp 17    Ht '6\' 2"'  (1.88 m)  Wt 256 lb 2.8 oz (116.2 kg)    SpO2 100%    BMI 32.89 kg/m   Physical Exam Vitals signs reviewed.  Cardiovascular:     Rate and Rhythm: Normal rate and regular rhythm.     Heart sounds: Normal heart sounds.  Pulmonary:     Breath sounds: Wheezing present.  Abdominal:     Tenderness: There is no abdominal tenderness.  Musculoskeletal: Normal range of motion.  Skin:    General: Skin is warm and dry.  Neurological:     Mental Status: He is alert and oriented to person, place, and time.  Psychiatric:        Mood and Affect: Mood normal.        Behavior: Behavior normal.        Thought Content: Thought content normal.        Judgment: Judgment normal.     Imaging: Dg Abd 1 View  Result Date: 09/22/2018 CLINICAL DATA:  Nasogastric placement. EXAM: ABDOMEN - 1 VIEW COMPARISON:  09/12/2018 FINDINGS: Feeding tube enters the stomach, loops and has its tip in the fundus. Gas pattern within normal limits. IMPRESSION: Feeding tube enters the stomach, loops and has its tip in the fundus of the stomach. Electronically Signed   By: Nelson Chimes M.D.   On: 09/22/2018 11:03   Ct Chest Wo Contrast  Result Date: 09/10/2018 CLINICAL DATA:  Severe shortness of breath. EXAM: CT CHEST WITHOUT CONTRAST TECHNIQUE: Multidetector CT imaging of the chest was performed following the standard protocol without IV contrast. COMPARISON:  Single-view of the chest 09/08/2018.  PA and lateral chest 05/14/2017. PET CT scan 09/05/2018. FINDINGS: Cardiovascular: There is mild cardiomegaly. Calcific aortic and coronary atherosclerosis noted. No pericardial effusion. Mediastinum/Nodes: No enlarged mediastinal or axillary lymph nodes. Thyroid gland, trachea, and esophagus demonstrate no significant findings. Lungs/Pleura: Extensive ground-glass attenuation is present throughout the lungs with inter and intralobular septal thickening. Small areas of sparing in the superior segment of the right lower lobe and posterior left lower lobe noted. No nodule or mass. No pleural effusion. Upper Abdomen: There is fatty infiltration of the visualized liver. Musculoskeletal: No acute or focal abnormality. IMPRESSION: Diffusely abnormal appearance of the lungs is nonspecific and can be seen in bacterial pneumonia, acute interstitial pneumonia, ARDS among other causes. Cardiomegaly. Fatty infiltration of the liver. Aortic Atherosclerosis (ICD10-I70.0). Electronically Signed   By: Inge Rise M.D.   On: 09/10/2018 12:56   US Renal  Result Date: 09/18/2018 CLINICAL DATA:  Acute renal failure. EXAM: RENAL / URINARY TRACT ULTRASOUND COMPLETE COMPARISON:  PET CT scan 09/05/2018. FINDINGS: Right Kidney: Renal measurements: 10.7 x 5.9 x 5.4 cm = volume: 163.7 mL . Echogenicity within normal limits. No mass or hydronephrosis visualized. Echogenic liver consistent with fatty infiltration noted. There is a small amount of perihepatic fluid Left Kidney: Renal measurements: 12.6 x 5.7 x 5.2 cm = volume: 196.9 mL. Echogenicity within normal limits. No mass or hydronephrosis visualized. Bladder: Decompressed with a Foley catheter in place. IMPRESSION: Negative for hydronephrosis.  No acute abnormality. Fatty infiltration a of the liver and small volume perihepatic ascites. Electronically Signed   By: Inge Rise M.D.   On: 09/18/2018 13:47   Nm Pet Image Restag (ps) Skull Base To Thigh  Result Date:  09/05/2018 CLINICAL DATA:  Subsequent treatment strategy for metastatic bladder cancer. EXAM: NUCLEAR MEDICINE PET SKULL BASE TO THIGH TECHNIQUE: 12.6 mCi F-18 FDG was injected intravenously. Full-ring PET imaging was performed from the skull base to  thigh after the radiotracer. CT data was obtained and used for attenuation correction and anatomic localization. Fasting blood glucose: 171 mg/dl COMPARISON:  04/25/2018 PET-CT. FINDINGS: Mediastinal blood pool activity: SUV max 2.9 Liver activity: SUV max NA NECK: No enlarged or hypermetabolic lymph nodes in the neck. Resolved hypermetabolism within the nonenlarged left supraclavicular node (max SUV 2.2, previous max SUV 3.7). Incidental CT findings: Stable mucous retention cyst versus polyp in the left maxillary sinus. CHEST: No enlarged or hypermetabolic axillary, mediastinal or hilar lymph nodes. No focal hypermetabolic pulmonary findings. Stable mild hypermetabolism in the lower thoracic esophagus at the esophagogastric junction without CT correlate. Incidental CT findings: Coronary atherosclerosis. Right internal jugular Port-A-Cath terminates at the cavoatrial junction. Atherosclerotic thoracic aorta with stable ectatic 4.2 cm ascending thoracic aorta. Mild centrilobular emphysema. No acute consolidative airspace disease, lung masses or new significant pulmonary nodules. ABDOMEN/PELVIS: No abnormal hypermetabolic activity within the liver, pancreas, adrenal glands, or spleen. No hypermetabolic lymph nodes in the abdomen or pelvis. Incidental CT findings: Stable 1.4 cm posterior left liver lobe cyst. Scattered subcentimeter hypodense lesions throughout the liver are too small to characterize and are unchanged, considered benign. Atherosclerotic nonaneurysmal abdominal aorta. Small hiatal hernia. Mild sigmoid diverticulosis. New mild segmental wall thickening in the distal sigmoid colon with loss of normal fat plane between the sigmoid colon and bladder. No  significant acute pericolonic fat stranding. Stable scattered coarse calcifications in right posterosuperior bladder wall. SKELETON: No focal hypermetabolic activity to suggest skeletal metastasis. Incidental CT findings: Left total hip arthroplasty. IMPRESSION: 1. No metabolic evidence of residual or recurrent metastatic disease. Resolved left supraclavicular lymph node hypermetabolism. 2. New mild segmental wall thickening in the distal sigmoid colon with chronic loss of normal fat plane between the sigmoid colon and bladder. Underlying mild sigmoid diverticulosis. Sigmoid diverticulitis of uncertain chronicity cannot be excluded. No discrete abscess on this noncontrast CT study. No bladder gas or free air. 3. Aortic Atherosclerosis (ICD10-I70.0) and Emphysema (ICD10-J43.9). Electronically Signed   By: Ilona Sorrel M.D.   On: 09/05/2018 09:37   Dg Chest Port 1 View  Result Date: 09/24/2018 CLINICAL DATA:  Pt sob, hx hypoxia, pneumonia, pt has 3 lines and an ng tube, dialysis Rt ij, rt port and left ij ? getting blood mk rt-r EXAM: PORTABLE CHEST 1 VIEW COMPARISON:  09/20/2018 FINDINGS: Port in the anterior chest wall with tip in distal SVC. LEFT and RIGHT central venous lines with tips in the mid SVC. Feeding tube with tip in the stomach. Normal cardiac silhouette. Bilateral fine airspace disease.  No pneumothorax IMPRESSION: 1. Stable support apparatus. 2. Mild pulmonary edema pattern. Electronically Signed   By: Suzy Bouchard M.D.   On: 09/24/2018 13:34   Dg Chest Port 1 View  Result Date: 09/20/2018 CLINICAL DATA:  Acute respiratory failure with hypoxia, history of hypertension, bladder cancer, prostate cancer EXAM: PORTABLE CHEST 1 VIEW COMPARISON:  Portable exam 0433 hours compared to 09/19/2018 FINDINGS: Tip of endotracheal tube projects 5.8 cm above carina. Nasogastric tube extends into stomach. BILATERAL jugular lines with tips projecting over SVC. RIGHT jugular Port-A-Cath with tip projecting  over SVC. Enlargement of cardiac silhouette with slight vascular congestion. Mediastinal contour stable. Hazy infiltrates of the mid to lower lungs unchanged. No pleural effusion or pneumothorax. IMPRESSION: Persistent hazy infiltrates of the mid to lower lungs, unchanged. Electronically Signed   By: Lavonia Dana M.D.   On: 09/20/2018 08:42   Dg Chest Port 1 View  Result Date: 09/19/2018 CLINICAL DATA:  Hemodialysis  catheter dysfunction. EXAM: PORTABLE CHEST 1 VIEW COMPARISON:  Radiograph of same day. FINDINGS: Stable cardiomegaly. Endotracheal and nasogastric tubes are unchanged in position. Left internal jugular catheter is unchanged. Two right internal jugular catheters are unchanged in position. This includes right internal jugular dialysis catheter, with distal tip in expected position of the SVC. No pneumothorax is noted. No significant pleural effusion is noted. Stable bibasilar atelectasis, infiltrates or edema are noted. Bony thorax is unremarkable. IMPRESSION: Stable support apparatus, including right internal jugular dialysis catheter, the distal tip of which is seen in expected position of the SVC. Stable bibasilar opacities as described above. Electronically Signed   By: Marijo Conception M.D.   On: 09/19/2018 17:47   Dg Chest Port 1 View  Result Date: 09/19/2018 CLINICAL DATA:  Respiratory failure. EXAM: PORTABLE CHEST 1 VIEW COMPARISON:  Single-view of the chest 09/18/2018 and 09/17/2018. FINDINGS: Support tubes and lines are unchanged. Lung volumes are slightly lower than on the most recent examination. Diffuse bilateral airspace opacities appear worse. No pneumothorax. Heart size is enlarged. IMPRESSION: No change in support apparatus. Diffuse bilateral airspace disease appears slightly worse which could be due to increased edema or pneumonia. Lung volumes are slightly lower and decreased aeration could be due in part to atelectasis. Electronically Signed   By: Inge Rise M.D.   On:  09/19/2018 08:24   Dg Chest Port 1 View  Result Date: 09/18/2018 CLINICAL DATA:  Central line placement EXAM: PORTABLE CHEST 1 VIEW COMPARISON:  09/18/2018 FINDINGS: Right Port-A-Cath and left central line remain in place, unchanged. Endotracheal tube and NG tube are also unchanged. Interval placement of right Vas-Cath with the tip in the SVC. No pneumothorax. Cardiomegaly with vascular congestion. Interstitial and airspace opacities have slightly improved since prior study. IMPRESSION: Interval placement of right Vas-Cath with the tip in the SVC. No pneumothorax. Remainder the support devices are stable. Mild cardiomegaly, vascular congestion. Interstitial and airspace opacities slightly improved. Electronically Signed   By: Rolm Baptise M.D.   On: 09/18/2018 11:44   Dg Chest Port 1 View  Result Date: 09/18/2018 CLINICAL DATA:  Respiratory failure. EXAM: PORTABLE CHEST 1 VIEW COMPARISON:  One-view chest x-ray 09/17/2018 FINDINGS: The heart is enlarged. Endotracheal tube terminates 4 cm above the carina, in satisfactory position. Right IJ Port-A-Cath is stable. A left IJ line is stable. Increasing interstitial and airspace disease is noted both lung bases. Small effusions are suspected. Upper lung fields remain clear. IMPRESSION: 1. Increasing interstitial and airspace disease at both lung bases pattern below with ARDS or edema. Infection is not excluded. 2. Suspect bilateral pleural effusions. 3. Support apparatus is stable and in satisfactory position. Electronically Signed   By: San Morelle M.D.   On: 09/18/2018 07:50   Dg Chest Port 1 View  Result Date: 09/17/2018 CLINICAL DATA:  Respiratory failure EXAM: PORTABLE CHEST 1 VIEW COMPARISON:  Chest radiograph 09/15/2018 FINDINGS: ET tube mid trachea. Enteric tube courses inferior to the diaphragm. Right-sided Port-A-Cath is present with tip projecting over the superior vena cava. Stable cardiomegaly. Low lung volumes. Similar bilateral mid lower  lung heterogeneous opacities. Left IJ central venous catheter tip projects over the superior vena cava. IMPRESSION: Similar mid lower lung heterogeneous opacities with low lung volumes. Electronically Signed   By: Lovey Newcomer M.D.   On: 09/17/2018 08:33   Dg Chest Port 1 View  Result Date: 09/15/2018 CLINICAL DATA:  Respiratory failure. EXAM: PORTABLE CHEST 1 VIEW COMPARISON:  Radiograph September 14, 2018. FINDINGS: Stable cardiomediastinal  silhouette. Endotracheal and nasogastric tubes are unchanged in position. Right internal jugular Port-A-Cath is unchanged in position. Left internal jugular catheter is unchanged. No pneumothorax is noted. Stable bibasilar edema, atelectasis or infiltrates are noted. Bony thorax is unremarkable. IMPRESSION: Stable support apparatus. Stable bibasilar opacities as described above. Electronically Signed   By: Marijo Conception M.D.   On: 09/15/2018 07:47   Dg Chest Port 1 View  Result Date: 09/14/2018 CLINICAL DATA:  Respiratory failure EXAM: PORTABLE CHEST 1 VIEW COMPARISON:  09/13/2018 FINDINGS: Cardiac shadow is stable. Left jugular central line, right-sided chest wall port, endotracheal tube and gastric catheter are again seen and stable. Bilateral opacities are noted within the bases primarily stable from the previous study. No new focal abnormality is noted. IMPRESSION: Stable bibasilar opacities.  No new focal abnormality is noted. Electronically Signed   By: Inez Catalina M.D.   On: 09/14/2018 08:11   Dg Chest Port 1 View  Result Date: 09/13/2018 CLINICAL DATA:  Respiratory failure EXAM: PORTABLE CHEST 1 VIEW COMPARISON:  09/12/2018 FINDINGS: Cardiac shadow is stable. Endotracheal tube, gastric catheter and left jugular central line are again seen. Right-sided chest wall port is noted in satisfactory position. Lungs are well aerated bilaterally. Bilateral infiltrates are again seen and stable. No new focal abnormality is noted. IMPRESSION: Stable appearance of the chest  from the previous day. Bilateral opacities are again identified. Electronically Signed   By: Inez Catalina M.D.   On: 09/13/2018 07:27   Dg Chest Port 1 View  Result Date: 09/12/2018 CLINICAL DATA:  Central line placement. EXAM: PORTABLE CHEST 1 VIEW COMPARISON:  Radiograph of same day. FINDINGS: Endotracheal and nasogastric tubes are unchanged in position. Stable cardiomediastinal silhouette. Stable position of right internal jugular Port-A-Cath. Interval placement of left internal jugular catheter with distal tip in expected position of the SVC. No pneumothorax is noted. Stable bilateral lung opacities are noted concerning for edema or possibly inflammation. Bony thorax is unremarkable. IMPRESSION: Interval placement of left internal jugular catheter with distal tip in expected position of the SVC. No pneumothorax is noted. Otherwise stable support apparatus. Stable bilateral lung opacities are noted as well. Electronically Signed   By: Marijo Conception M.D.   On: 09/12/2018 19:38   Dg Chest Port 1 View  Result Date: 09/12/2018 CLINICAL DATA:  Hypertension, hyperlipidemia with metastatic bladder cancer. Status post intubation. EXAM: PORTABLE CHEST 1 VIEW COMPARISON:  09/11/2018 FINDINGS: There is been interval placement of ET tube with tip 4.4 cm above the carina. Right chest wall port a catheter tip is at the cavoatrial junction. NG tube tip is well below the GE junction. Stable cardiac enlargement. Bilateral hazy lung opacities throughout both lungs are unchanged from previous exam. IMPRESSION: 1. Satisfactory position of ET tube with tip above carina. 2. No change in aeration a lungs. Electronically Signed   By: Kerby Moors M.D.   On: 09/12/2018 09:45   Dg Chest Port 1 View  Result Date: 09/11/2018 CLINICAL DATA:  Respiratory failure EXAM: PORTABLE CHEST 1 VIEW COMPARISON:  09/08/18 FINDINGS: Cardiac shadow is mildly prominent. Right-sided chest wall port is seen in satisfactory position. Diffuse  opacities are noted throughout both lungs similar to that seen on prior CT examination. No new focal abnormality is noted. IMPRESSION: Stable bilateral infiltrates similar to that seen on recent CT. Electronically Signed   By: Inez Catalina M.D.   On: 09/11/2018 07:18   Dg Chest Port 1 View  Result Date: 09/08/2018 CLINICAL DATA:  Sepsis  EXAM: PORTABLE CHEST 1 VIEW COMPARISON:  06/23/2018, PET CT 09/05/2018 FINDINGS: Right-sided central venous port tip over the SVC. Stable mild cardiomegaly. Mild diffuse interstitial and ground-glass opacity. No consolidation or effusion. No pneumothorax. IMPRESSION: 1. Mild diffuse interstitial and ground-glass opacity, possible atypical or viral pneumonia 2. Mild cardiomegaly Electronically Signed   By: Donavan Foil M.D.   On: 09/08/2018 03:22   Dg Abd Portable 1v  Result Date: 09/12/2018 CLINICAL DATA:  Orogastric tube placement. Recent bacteremia. Metastatic bladder cancer. EXAM: PORTABLE ABDOMEN - 1 VIEW COMPARISON:  Radiographs 12/21/2017.  PET-CT 04/25/2018. FINDINGS: 0925 hours. Tip of the enteric tube projects over the mid stomach. There is gas throughout the visualized stomach, small and large bowel without significant distention or wall thickening. No suspicious abdominal calcifications. There are degenerative changes throughout the thoracolumbar spine. IMPRESSION: Enteric tube projects over the mid stomach. Electronically Signed   By: Richardean Sale M.D.   On: 09/12/2018 09:49   Vas [place] Lower Extremity Venous (dvt)  Result Date: 09/10/2018  Lower Venous Study Indications: SOB, and pulmonary embolism.  Performing Technologist: Abram Sander RVS  Examination Guidelines: A complete evaluation includes B-mode imaging, spectral Doppler, color Doppler, and power Doppler as needed of all accessible portions of each vessel. Bilateral testing is considered an integral part of a complete examination. Limited examinations for reoccurring indications may be performed as  noted.  +---------+---------------+---------+-----------+----------+-------+  RIGHT     Compressibility Phasicity Spontaneity Properties Summary  +---------+---------------+---------+-----------+----------+-------+  CFV       Full            Yes       Yes                             +---------+---------------+---------+-----------+----------+-------+  SFJ       Full                                                      +---------+---------------+---------+-----------+----------+-------+  FV Prox   Full                                                      +---------+---------------+---------+-----------+----------+-------+  FV Mid    Full                                                      +---------+---------------+---------+-----------+----------+-------+  FV Distal Full                                                      +---------+---------------+---------+-----------+----------+-------+  PFV       Full                                                      +---------+---------------+---------+-----------+----------+-------+  POP       Full            Yes       Yes                             +---------+---------------+---------+-----------+----------+-------+  PTV       Full                                                      +---------+---------------+---------+-----------+----------+-------+  PERO      Full                                                      +---------+---------------+---------+-----------+----------+-------+   +---------+---------------+---------+-----------+----------+--------------+  LEFT      Compressibility Phasicity Spontaneity Properties Summary         +---------+---------------+---------+-----------+----------+--------------+  CFV       Full            Yes       Yes                                    +---------+---------------+---------+-----------+----------+--------------+  SFJ       Full                                                              +---------+---------------+---------+-----------+----------+--------------+  FV Prox   Full                                                             +---------+---------------+---------+-----------+----------+--------------+  FV Mid    Full                                                             +---------+---------------+---------+-----------+----------+--------------+  FV Distal Full                                                             +---------+---------------+---------+-----------+----------+--------------+  PFV       Full                                                             +---------+---------------+---------+-----------+----------+--------------+  POP       Full            Yes       Yes                                    +---------+---------------+---------+-----------+----------+--------------+  PTV       Full                                                             +---------+---------------+---------+-----------+----------+--------------+  PERO                                                       Not visualized  +---------+---------------+---------+-----------+----------+--------------+     Summary: Right: There is no evidence of deep vein thrombosis in the lower extremity. No cystic structure found in the popliteal fossa. Left: There is no evidence of deep vein thrombosis in the lower extremity. No cystic structure found in the popliteal fossa.  *See table(s) above for measurements and observations. Electronically signed by Monica Martinez MD on 09/10/2018 at 3:05:06 PM.    Final     Labs:  CBC: Recent Labs    09/23/18 0340 09/24/18 0512 09/24/18 1633 09/25/18 0517  WBC 16.7* 13.1* 21.0* 11.6*  HGB 7.9* 6.6* 8.5* 8.3*  HCT 25.1* 21.4* 26.5* 26.1*  PLT 43* 36* 34* 31*    COAGS: Recent Labs    12/19/17 1256  09/20/18 1121 09/21/18 0306 09/22/18 0320 09/23/18 0340  INR 1.05  --   --   --   --   --   APTT  --    < > 45* '25 26 25   ' < > = values in this  interval not displayed.    BMP: Recent Labs    09/22/18 1617 09/23/18 0340 09/24/18 0512 09/25/18 0517  NA 136 137 136 139  K 4.2 3.9 3.8 4.1  CL 101 103 103 102  CO2 '25 26 24 25  ' GLUCOSE 157* 183* 198* 195*  BUN 40* 42* 77* 105*  CALCIUM 8.3* 8.1* 7.9* 7.7*  CREATININE 1.77* 1.60* 3.15* 4.04*  GFRNONAA 39* 44* 19* 14*  GFRAA 45* 51* 22* 17*    LIVER FUNCTION TESTS: Recent Labs    09/22/18 0320 09/22/18 1617 09/23/18 0340 09/24/18 0512 09/25/18 0517  BILITOT 0.8  --  1.0 0.7 0.9  AST 72*  --  52* 41 40  ALT 71*  --  65* 48* 43  ALKPHOS 55  --  58 48 51  PROT 4.9*  --  5.1* 4.0* 3.7*  ALBUMIN 2.9* 2.8* 2.8* 2.2* 2.1*    TUMOR MARKERS: No results for input(s): AFPTM, CEA, CA199, CHROMGRNA in the last 8760 hours.  Assessment and Plan:  Hx bladder cancer Hypoxia; resp failure AKI/CKD-- now in dialysis plt dropping-- thrombocytopenia Scheduled for BM Bx in am Risks and benefits of Bone marrow bx was discussed with the patient and/or patient's family including, but not limited to bleeding, infection, damage to adjacent structures or low yield requiring additional tests.  All of the questions were  answered and there is agreement to proceed. Consent signed and in chart.   Thank you for this interesting consult.  I greatly enjoyed meeting [name] Rose and look forward to participating in their care.  A copy of this report was sent to the requesting provider on this date.  Electronically Signed: Lavonia Drafts, PA-C 09/25/2018, 11:00 AM   I spent a total of 20 Minutes    in face to face in clinical consultation, greater than 50% of which was counseling/coordinating care for BM bx

## 2018-09-25 NOTE — Progress Notes (Signed)
Nutrition Follow-up  RD working remotely.  DOCUMENTATION CODES:   Obesity unspecified  INTERVENTION:   - d/c Vital AF 1.2  Tube feeding via NGT: - Vital 1.5 @ 60 ml/hr (1440 ml/day) - Pro-stat 30 ml TID  Tube feeding regimen provides 2460 kcal, 142 grams of protein, and 1100 ml of H2O (100% of needs).  - Will monitor for diet advancement and supplement as appropriate  NUTRITION DIAGNOSIS:   Inadequate oral intake related to inability to eat as evidenced by NPO status.  Ongoing  GOAL:   Patient will meet greater than or equal to 90% of their needs  Met via TF  MONITOR:   Labs, Weight trends, TF tolerance, I & O's  REASON FOR ASSESSMENT:   Consult Enteral/tube feeding initiation and management  ASSESSMENT:   68 y.o. male with medical history significant of hypertension, hyperlipidemia, prediabetes, prostate cancer, metastasized bladder cancer on chemotherapy, PAF, CKD stage III, MSSA bacteremia.  Patient presented secondary to significant weakness overnight. Admitted for pneumonia.  6/08 - CRRT initiated 6/10 - extubated 6/12 - NGT placed, TF started 6/13 - CRRT d/c, transferred to [facility]  Noted plan for HD today and bone marrow biopsy tomorrow due to worsening platelet count.  SLP saw pt this morning with recommendations for Dysphagia 3 diet with thin liquids. However, diet not advanced. Will continue with TF until diet advanced.  NGT in place.  Weight up 1 lb since first available measured weight on 6/03. Reviewed RN edema assessment. Pt with mild pitting generalized edema, mild pitting edema to BUE, and deep pitting edema to BLE.  Current TF: Vital AF 1.2 @ 65 ml/hr, Pro-stat 60 ml BID which provides 2272 kcal, 177 grams of protein, and 1265 ml free water.  Medications reviewed and include: SSI, Protonix, vitamin B-6  Labs reviewed: hemoglobin 8.3 CBG's: 163-230 x 24 hours  UOP: 550 ml x 24 hours I/O's: -2.7 L since admit  Diet Order:   Diet Order             Diet NPO time specified Except for: Sips with Meds  Diet effective midnight        Diet NPO time specified  Diet effective now              EDUCATION NEEDS:   Not appropriate for education at this time  Skin:  Skin Assessment: Reviewed RN Assessment (MASD to buttocks)  Last BM:  09/23/18  Height:   Ht Readings from Last 1 Encounters:  09/13/18 _0  (1.88 m)    Weight:   Wt Readings from Last 1 Encounters:  09/25/18 116.2 kg    Ideal Body Weight:  86.3 kg  BMI:  Body mass index is 32.89 kg/m.  Estimated Nutritional Needs:   Kcal:  2380-2610 kcal  Protein:  125-140 grams  Fluid:  UOP + 1000 ml    Gaynell Face, MS, RD, LDN Inpatient Clinical Dietitian Pager: [phone] Weekend/After Hours: [phone]

## 2018-09-26 ENCOUNTER — Inpatient Hospital Stay (HOSPITAL_COMMUNITY): Payer: PPO

## 2018-09-26 LAB — RENAL FUNCTION PANEL
Albumin: 2.2 g/dL — ABNORMAL LOW (ref 3.5–5.0)
Albumin: 2.2 g/dL — ABNORMAL LOW (ref 3.5–5.0)
Anion gap: 10 (ref 5–15)
Anion gap: 8 (ref 5–15)
BUN: 55 mg/dL — ABNORMAL HIGH (ref 8–23)
BUN: 55 mg/dL — ABNORMAL HIGH (ref 8–23)
CO2: 27 mmol/L (ref 22–32)
CO2: 26 mmol/L (ref 22–32)
Calcium: 7.6 mg/dL — ABNORMAL LOW (ref 8.9–10.3)
Calcium: 7.4 mg/dL — ABNORMAL LOW (ref 8.9–10.3)
Chloride: 101 mmol/L (ref 98–111)
Chloride: 97 mmol/L — ABNORMAL LOW (ref 98–111)
Creatinine, Ser: 2.34 mg/dL — ABNORMAL HIGH (ref 0.61–1.24)
Creatinine, Ser: 2.41 mg/dL — ABNORMAL HIGH (ref 0.61–1.24)
GFR calc Af Amer: 32 mL/min — ABNORMAL LOW (ref >60)
GFR calc Af Amer: 31 mL/min — ABNORMAL LOW (ref >60)
GFR calc non Af Amer: 28 mL/min — ABNORMAL LOW (ref >60)
GFR calc non Af Amer: 27 mL/min — ABNORMAL LOW (ref >60)
Glucose, Bld: 117 mg/dL — ABNORMAL HIGH (ref 70–99)
Glucose, Bld: 102 mg/dL — ABNORMAL HIGH (ref 70–99)
Phosphorus: 3.2 mg/dL (ref 2.5–4.6)
Phosphorus: 3.5 mg/dL (ref 2.5–4.6)
Potassium: 3.6 mmol/L (ref 3.5–5.1)
Potassium: 3.3 mmol/L — ABNORMAL LOW (ref 3.5–5.1)
Sodium: 138 mmol/L (ref 135–145)
Sodium: 131 mmol/L — ABNORMAL LOW (ref 135–145)

## 2018-09-26 LAB — CBC
HCT: 27.2 % — ABNORMAL LOW (ref 39.0–52.0)
Hemoglobin: 8.7 g/dL — ABNORMAL LOW (ref 13.0–17.0)
MCH: 33.2 pg (ref 26.0–34.0)
MCHC: 32 g/dL (ref 30.0–36.0)
MCV: 103.8 fL — ABNORMAL HIGH (ref 80.0–100.0)
Platelets: 34 10*3/uL — ABNORMAL LOW (ref 150–400)
RBC: 2.62 MIL/uL — ABNORMAL LOW (ref 4.22–5.81)
RDW: 22.8 % — ABNORMAL HIGH (ref 11.5–15.5)
WBC: 10.1 10*3/uL (ref 4.0–10.5)
nRBC: 0 % (ref 0.0–0.2)

## 2018-09-26 LAB — GLUCOSE, CAPILLARY
Glucose-Capillary: 100 mg/dL — ABNORMAL HIGH (ref 70–99)
Glucose-Capillary: 101 mg/dL — ABNORMAL HIGH (ref 70–99)
Glucose-Capillary: 106 mg/dL — ABNORMAL HIGH (ref 70–99)
Glucose-Capillary: 107 mg/dL — ABNORMAL HIGH (ref 70–99)
Glucose-Capillary: 120 mg/dL — ABNORMAL HIGH (ref 70–99)
Glucose-Capillary: 122 mg/dL — ABNORMAL HIGH (ref 70–99)
Glucose-Capillary: 124 mg/dL — ABNORMAL HIGH (ref 70–99)

## 2018-09-26 LAB — LACTATE DEHYDROGENASE: LDH: 421 U/L — ABNORMAL HIGH (ref 98–192)

## 2018-09-26 LAB — HEPATITIS B CORE ANTIBODY, TOTAL: Hep B Core Total Ab: NEGATIVE

## 2018-09-26 LAB — HEPATITIS B SURFACE ANTIBODY,QUALITATIVE: Hep B S Ab: NONREACTIVE

## 2018-09-26 LAB — PARATHYROID HORMONE, INTACT (NO CA): PTH: 96 pg/mL — ABNORMAL HIGH (ref 15–65)

## 2018-09-26 LAB — HEPATITIS B SURFACE ANTIGEN: Hepatitis B Surface Ag: NEGATIVE

## 2018-09-26 MED ORDER — FENTANYL CITRATE (PF) 100 MCG/2ML IJ SOLN
INTRAMUSCULAR | Status: AC | PRN
  Administered 2018-09-26: 50 ug via INTRAVENOUS

## 2018-09-26 MED ORDER — FENTANYL CITRATE (PF) 100 MCG/2ML IJ SOLN
INTRAMUSCULAR | Status: AC
  Filled 2018-09-26: qty 2

## 2018-09-26 MED ORDER — RENA-VITE PO TABS
1.0000 | ORAL_TABLET | Freq: Every day | ORAL | Status: DC
  Administered 2018-09-26 – 2018-10-13 (×18): 1 via ORAL
  Filled 2018-09-26 (×18): qty 1

## 2018-09-26 MED ORDER — MIDAZOLAM HCL 2 MG/2ML IJ SOLN
INTRAMUSCULAR | Status: AC | PRN
  Administered 2018-09-26: 1 mg via INTRAVENOUS

## 2018-09-26 MED ORDER — MIDAZOLAM HCL 2 MG/2ML IJ SOLN
INTRAMUSCULAR | Status: AC
  Filled 2018-09-26: qty 2

## 2018-09-26 NOTE — Progress Notes (Signed)
PROGRESS NOTE    [name] Rose  GQQ:[id number] DOB: 01/13/1951 DOA: 09/08/2018 PCP: Shirline Frees, MD   Brief Narrative: [name] Rose is a 68 y.o. male who presented with respiratory failure and fever secondary to presumed pneumonia. He was started empirically with antibiotics. COVID-19 negative. He worsened requiring mechanical ventilation and CRRT for acute renal failure. He was successfully transfused with plans to start intermittent HD for continued renal failure. Course has also been complicated by anemia and atrial fibrillation with RVR.   Assessment & Plan:   Principal Problem:   Pneumonia Active Problems:   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 (HCC)   LFT elevation   AKI (acute kidney injury) ([place])   CKD (chronic kidney disease), stage III (HCC)   Acute respiratory failure with hypoxia (HCC)   Acute renal failure (HCC)   Acute renal failure with acute tubular necrosis superimposed on stage 3 chronic kidney disease ([place])   Metastatic cancer ([place])   Palliative care encounter   Pneumonia Treated empirically with antibiotics. COVID-19 negative x2. ID consulted and recommending discontinuation of antibiotics on 6/8.   Acute respiratory failure with hypoxia Unsure of etiology, but likely pneumonia. Required mechanical ventilation from 6/2 to 6/10. Steroids burst completed. Acute worsening secondary to below -Keep O2 saturations >92% -Wean to room air as able  Atrial fibrillation w/ RVR Paroxysmal atrial fibrillation on metoprolol and diltiazem as an outpatient. Recurrent episodes of RVR. Currently rate controlled. -Continue diltiazem -Cardiology consult/recommendations: started Flecainide   Hypotension Acutely worsened since giving metoprolol in addition to anemia. Improved. -Blood transfusion as mentioned below  Acute kidney injury on CKD stage 3 Baseline creatinine of 1.7-1.8. Creatinine of 2.3 on admission in setting of hypotension.  Progressed to requiring CRRT while in the ICU. Nephrology on board. Now transitioning to IHD. -Nephrology recommendations: HD  Acute on chronic anemia Baseline hemoglobin of 9-10. Down to 7.8 today. Unknown etiology. No bleeding noted. Hematology on board with concern for possible renal etiology vs possible BM etiology. S/p 4 units of PRBC to date. -CBC daily  Chronic thrombocytopenia Stable. Unknown etiology. -Hematology recommendations: BM biopsy planned today  Essential hypertension Hypotension improved with resolution of RVR and transfusion. On diltiazem, metoprolol, losartan as an outpatient. -Continue diltiazem  Metastatic bladder cancer Currently on chemotherapy treatment. Follows with [name]. Last chemotherapy treatment was 5/18 per patient.  Dysphagia Severe aspiration risk per SLP evaluation on 6/12. Currently on tube feeds but advanced to a dysphagia 3 diet on 6/15   DVT prophylaxis: SCDs Code Status:   Code Status: Full Code Family Communication: None Disposition Plan: Discharge pending further clinical improvement   Consultants:   PCCM  Nephrology  Cardiology  Palliative care  Procedures:   CRRT  Antimicrobials:  Vancomycin  Cefepime  Bactrim    Subjective: No issues overnight.  Objective: Vitals:   09/26/18 0930 09/26/18 0935 09/26/18 0942 09/26/18 0945  BP:   115/69 111/70  Pulse: 88 88 92 84  Resp: (!) 21 (!) 25 (!) 22 20  Temp:      TempSrc:      SpO2: 99% 98% 94% 97%  Weight:      Height:        Intake/Output Summary (Last 24 hours) at 09/26/2018 1107 Last data filed at 09/26/2018 0400 Gross per 24 hour  Intake 931 ml  Output 2396 ml  Net -1465 ml   Filed Weights   09/25/18 0500 09/25/18 1548 09/26/18 0411  Weight: 116.2 kg 110.9 kg 112.8  kg    Examination:  General exam: Appears calm and comfortable Respiratory system: Rales, diminished. Respiratory effort normal. Cardiovascular system: S1 & S2 heard, RRR. No  murmurs, rubs, gallops or clicks. Gastrointestinal system: Abdomen is nondistended, soft and nontender. No organomegaly or masses felt. Normal bowel sounds heard. Central nervous system: No focal neurological deficits. Extremities: No edema. No calf tenderness Skin: No cyanosis. No rashes   Data Reviewed: I have personally reviewed following labs and imaging studies  CBC: Recent Labs  Lab 09/22/18 0320 09/23/18 0340 09/24/18 0512 09/24/18 1633 09/25/18 0517 09/26/18 0423  WBC 12.8* 16.7* 13.1* 21.0* 11.6* 10.1  NEUTROABS 10.9* 15.0* 11.4*  --  9.9*  --   HGB 7.8* 7.9* 6.6* 8.5* 8.3* 8.7*  HCT 25.1* 25.1* 21.4* 26.5* 26.1* 27.2*  MCV 100.8* 104.1* 107.0* 101.5* 103.2* 103.8*  PLT 50* 43* 36* 34* 31* 34*   Basic Metabolic Panel: Recent Labs  Lab 09/21/18 0306 09/21/18 1539 09/22/18 0320 09/22/18 1102 09/22/18 1617 09/23/18 0340 09/24/18 0512 09/25/18 0517 09/26/18 0423  NA 138 138 137  --  136 137 136 139 138  K 5.3* 4.8 4.5  --  4.2 3.9 3.8 4.1 3.6  CL 104 105 102  --  101 103 103 102 101  CO2 $Re'25 26 27  'oLF$ --  $R'25 26 24 25 27  'vS$ GLUCOSE 107* 122* 138*  --  157* 183* 198* 195* 117*  BUN 59* 49* 45*  --  40* 42* 77* 105* 55*  CREATININE 1.62* 1.44* 1.55*  --  1.77* 1.60* 3.15* 4.04* 2.34*  CALCIUM 8.4* 8.1* 8.4*  --  8.3* 8.1* 7.9* 7.7* 7.6*  MG 2.4  --  2.6* 2.8* 2.6* 2.7*  --   --   --   PHOS 4.2 4.7*  --  5.7* 5.5*  5.5* 4.4  --   --  3.2   GFR: Estimated Creatinine Clearance: 40.9 mL/min (A) (by C-G formula based on SCr of 2.34 mg/dL (H)). Liver Function Tests: Recent Labs  Lab 09/22/18 0320 09/22/18 1617 09/23/18 0340 09/24/18 0512 09/25/18 0517 09/26/18 0423  AST 72*  --  52* 41 40  --   ALT 71*  --  65* 48* 43  --   ALKPHOS 55  --  58 48 51  --   BILITOT 0.8  --  1.0 0.7 0.9  --   PROT 4.9*  --  5.1* 4.0* 3.7*  --   ALBUMIN 2.9* 2.8* 2.8* 2.2* 2.1* 2.2*   No results for input(s): LIPASE, AMYLASE in the last 168 hours. No results for input(s): AMMONIA  in the last 168 hours. Coagulation Profile: Recent Labs  Lab 09/25/18 1211  INR 1.1   Cardiac Enzymes: No results for input(s): CKTOTAL, CKMB, CKMBINDEX, TROPONINI in the last 168 hours. BNP (last 3 results) No results for input(s): PROBNP in the last 8760 hours. HbA1C: No results for input(s): HGBA1C in the last 72 hours. CBG: Recent Labs  Lab 09/25/18 1637 09/25/18 2048 09/26/18 0004 09/26/18 0410 09/26/18 0756  GLUCAP 106* 168* 106* 122* 100*   Lipid Profile: No results for input(s): CHOL, HDL, LDLCALC, TRIG, CHOLHDL, LDLDIRECT in the last 72 hours. Thyroid Function Tests: No results for input(s): TSH, T4TOTAL, FREET4, T3FREE, THYROIDAB in the last 72 hours. Anemia Panel: No results for input(s): VITAMINB12, FOLATE, FERRITIN, TIBC, IRON, RETICCTPCT in the last 72 hours. Sepsis Labs: No results for input(s): PROCALCITON, LATICACIDVEN in the last 168 hours.  No results found for this or any previous  visit (from the past 240 hour(s)).       Radiology Studies: Dg Chest Port 1 View  Result Date: 09/24/2018 CLINICAL DATA:  Pt sob, hx hypoxia, pneumonia, pt has 3 lines and an ng tube, dialysis Rt ij, rt port and left ij ? getting blood mk rt-r EXAM: PORTABLE CHEST 1 VIEW COMPARISON:  09/20/2018 FINDINGS: Port in the anterior chest wall with tip in distal SVC. LEFT and RIGHT central venous lines with tips in the mid SVC. Feeding tube with tip in the stomach. Normal cardiac silhouette. Bilateral fine airspace disease.  No pneumothorax IMPRESSION: 1. Stable support apparatus. 2. Mild pulmonary edema pattern. Electronically Signed   By: Suzy Bouchard M.D.   On: 09/24/2018 13:34        Scheduled Meds: . chlorhexidine  15 mL Mouth Rinse BID  . Chlorhexidine Gluconate Cloth  6 each Topical Daily  . diltiazem  30 mg Per Tube Q6H  . feeding supplement (PRO-STAT SUGAR FREE 64)  30 mL Per Tube TID  . fentaNYL      . flecainide  50 mg Oral Q12H  . gabapentin  300 mg Per Tube  Q12H  . insulin aspart  0-9 Units Subcutaneous Q4H  . mouth rinse  15 mL Mouth Rinse q12n4p  . metoprolol tartrate  2.5 mg Intravenous Once  . midazolam      . multivitamin  1 tablet Oral QHS  . pantoprazole sodium  40 mg Per Tube Q24H  . sodium chloride flush  3 mL Intravenous Q12H   Continuous Infusions: . sodium chloride    . feeding supplement (VITAL 1.5 CAL) Stopped (09/26/18 0001)     LOS: 18 days     Cordelia Poche, MD [facility] 09/26/2018, 11:07 AM  If 7PM-7AM, please contact night-coverage [email or web address]

## 2018-09-26 NOTE — Progress Notes (Signed)
[name] Rose is now on hemodialysis.  It obviously works very well since his BUN is 55 and creatinine 2.34.  His LDH is on the higher side again.  Is 421.  His platelet count is low but stable at 34,000.  His hemoglobin is 8.7.  Hopefully, he will have the bone marrow biopsy today.  Speech pathology is working very hard with him to try to get him to swallow well and be able to have regular food.  I know this will help him and help with his overall emotional state.  It is going to take a lot of physical therapy for him to be able to go home.  Hopefully they will be able to work on him with physical therapy.  He was seen by cardiology yesterday for an episode of paroxysmal atrial fibrillation with rapid ventricular response.  He is on low-dose flecainide.  His vital signs are still stable.  He still is in normal sinus rhythm.  His temperature is 97.7.  Pulse 86.  Blood pressure 122/80.  His lungs sound pretty clear.  Cardiac exam regular rate and rhythm.  Abdomen is soft.  Bowel sounds are slightly decreased.  There is no fluid wave.  Extremities shows no edema.  Neurological exam is nonfocal.  At this point, I will have to see what the bone marrow biopsy shows regarding his thrombocytopenia.  I would be very surprised if he had marrow involvement by his malignancy.  Typically, bladder cancer does not invade the bone marrow.  I know that he is making slow but steady progress.  I just wish that his kidneys would start to work again.  Again, I just do not see that we will be able to treat him again for this bladder cancer.  Although he is in remission right now, I am positive that he will come out of remission.  Hopefully this will not be too quickly.  I am grateful for the wonderful care that he is getting by everybody in the stepdown unit.  [name] Ennever, MD  Psalm 91:1-2 

## 2018-09-26 NOTE — Progress Notes (Signed)
Subjective: Interval History: has no complaint , feels better.  Objective: Vital signs in last 24 hours: Temp:  [97.6 F (36.4 C)-99.3 F (37.4 C)] 99.3 F (37.4 C) (06/16 0743) Pulse Rate:  [76-107] 77 (06/16 0743) Resp:  [17-45] 17 (06/16 0743) BP: (96-132)/(70-85) 118/73 (06/16 0743) SpO2:  [93 %-100 %] 98 % (06/16 0743) Weight:  [110.9 kg-112.8 kg] 112.8 kg (06/16 0411) Weight change: -5.3 kg  Intake/Output from previous day: 06/15 0701 - 06/16 0700 In: 1126 [I.V.:506; NG/GT:620] Out: 2896 [Urine:500] Intake/Output this shift: No intake/output data recorded.  General appearance: alert, cooperative, no distress, moderately obese and pale Neck: Ij cath Resp: diminished breath sounds bilaterally and rales bibasilar Cardio: irregularly irregular rhythm and systolic murmur: holosystolic 2/6, blowing at apex GI: obese, pos bs, soft, liver down 6 cm Extremities: edema 3+ and Port-a-cath  Lab Results: Recent Labs    09/25/18 0517 09/26/18 0423  WBC 11.6* 10.1  HGB 8.3* 8.7*  HCT 26.1* 27.2*  PLT 31* 34*   BMET:  Recent Labs    09/25/18 0517 09/26/18 0423  NA 139 138  K 4.1 3.6  CL 102 101  CO2 25 27  GLUCOSE 195* 117*  BUN 105* 55*  CREATININE 4.04* 2.34*  CALCIUM 7.7* 7.6*   Recent Labs    09/25/18 1210  PTH 96*   Iron Studies: No results for input(s): IRON, TIBC, TRANSFERRIN, FERRITIN in the last 72 hours.  Studies/Results: Dg Chest Port 1 View  Result Date: 09/24/2018 CLINICAL DATA:  Pt sob, hx hypoxia, pneumonia, pt has 3 lines and an ng tube, dialysis Rt ij, rt port and left ij ? getting blood mk rt-r EXAM: PORTABLE CHEST 1 VIEW COMPARISON:  09/20/2018 FINDINGS: Port in the anterior chest wall with tip in distal SVC. LEFT and RIGHT central venous lines with tips in the mid SVC. Feeding tube with tip in the stomach. Normal cardiac silhouette. Bilateral fine airspace disease.  No pneumothorax IMPRESSION: 1. Stable support apparatus. 2. Mild pulmonary  edema pattern. Electronically Signed   By: Suzy Bouchard M.D.   On: 09/24/2018 13:34    I have reviewed the patient's current medications.  Assessment/Plan: 1 AKI ??urine not recorded but bag full.  Did well at HD, vol xs yet, acid/base/K/solute ok 2 Bladder Ca 3 DM 4 PAF 5 Sepsis with pneu resolved 6 Obesity P follow urine, chem, mobilize, Bone Marrow. counsel   LOS: 18 days   Jeneen Rinks Darra Rosa 09/26/2018,8:26 AM

## 2018-09-26 NOTE — Procedures (Signed)
Interventional Radiology Procedure:   Indications: Thrombocytopenia, bladder cancer and renal failure  Procedure: CT guided bone marrow biopsy  Findings: 2 aspirates and 1 core from left ilium  Complications: None     EBL: Minimal, less than 10 ml  Plan: Bedrest 1 hour.   Gisela Lea R. Anselm Pancoast, MD  Pager: [phone]

## 2018-09-26 NOTE — Progress Notes (Signed)
Spoke with patient wife who called to request phone call from provider. Provider paged requesting to call wife Melaine.

## 2018-09-26 NOTE — Evaluation (Signed)
Occupational Therapy Evaluation Patient Details Name: [name] Rose MRN: [id number] DOB: 23-Nov-1950 Today's Date: 09/26/2018    History of Present Illness (P) Pt is a 68 y/o male presenting with resiratory failure and fever secondary to presumed pneumonia.  Intubated 6/05/18/08, CRRT while in ICU.  Course complicated by anemia and atrial fib with RVR. PMH: prostate cancer with bladder mets, chemo induced mucositits, CKD, HTN, hyperglycemia.    Clinical Impression   PTA patient independent. Admitted for above and limited by problem list below, including generalized weakness, impaired balance, decreased activity tolerance. Patient requires min assist for grooming, mod assist for UB ADLs, total assist +2 for LB ADLS, and max-total assist +2 for bed mobility.  Tolerates sitting EOB for 2-3 minutes, with poor sitting balance and heavy posterior lean.  Noted HR increased at EOB with minimal mobility to 140 with Spo2 dropping to 85% on 2L supplemental oxgyen; once supine vitals stabilized-RN aware.  Patient will benefit from continued OT services while admitted and after dc at CIR level in order to maximize independence with ADLs/mobility and decrease burden of care.     Follow Up Recommendations  CIR;Supervision/Assistance - 24 hour    Equipment Recommendations  3 in 1 bedside commode    Recommendations for Other Services Rehab consult     Precautions / Restrictions Precautions Precautions: (P) Fall Restrictions Weight Bearing Restrictions: (P) No      Mobility Bed Mobility Overal bed mobility: (P) Needs Assistance Bed Mobility: (P) Supine to Sit;Sit to Supine     Supine to sit: (P) Max assist;+2 for physical assistance;HOB elevated Sit to supine: (P) Total assist;+2 for physical assistance   General bed mobility comments: (P) overall max assist +2 to transition to EOB for LB and trunk support, returned to supine with total assist; cueing for sequencing and technique   Transfers                 General transfer comment: (P) deferred due to safety/act [place]    Balance Overall balance assessment: (P) Needs assistance Sitting-balance support: (P) Bilateral upper extremity supported;Feet supported Sitting balance-Leahy Scale: (P) Poor Sitting balance - Comments: (P) max-total assist for sitting EOB with posterior lean  Postural control: (P) Posterior lean                                 ADL either performed or assessed with clinical judgement   ADL Overall ADL's : Needs assistance/impaired Eating/Feeding: Set up;Bed level   Grooming: Set up;Bed level   Upper Body Bathing: Moderate assistance;Sitting   Lower Body Bathing: Total assistance;+2 for physical assistance;Bed level   Upper Body Dressing : Moderate assistance;Sitting Upper Body Dressing Details (indicate cue type and reason): +2 sitting EOB Lower Body Dressing: Bed level;+2 for physical assistance;Total assistance     Toilet Transfer Details (indicate cue type and reason): deferred           General ADL Comments: pt requires increased assist due to genearlized weakness, impaired balance and decreased act tolearnce     Vision         Perception     Praxis      Pertinent Vitals/Pain Pain Assessment: (P) No/denies pain Faces Pain Scale: No hurt     Hand Dominance (P) Right   Extremity/Trunk Assessment Upper Extremity Assessment Upper Extremity Assessment: (P) Defer to OT evaluation RUE Deficits / Details: generalized weakness, grossly 3+/5; edema LUE Deficits / Details:  generalized weakness, grossly 3-/5 shoulder, 3+/5 elbow/hand; edema     Lower Extremity Assessment Lower Extremity Assessment: (P) RLE deficits/detail;LLE deficits/detail       Communication Communication Communication: (P) No difficulties   Cognition Arousal/Alertness: (P) Awake/alert Behavior During Therapy: (P) WFL for tasks assessed/performed Overall Cognitive Status: (P)  Impaired/Different from baseline Area of Impairment: (P) Problem solving                             Problem Solving: (P) Slow processing;Decreased initiation;Requires verbal cues;Difficulty sequencing     General Comments  noted increased HR to 140s with limited mobility EOB, on 2L supplemental oxgyen via [place] desating sitting EOB to 85%; once supine oxgyen saturations improved to 93% and HR into 80s     Exercises     Shoulder Instructions      Home Living Family/patient expects to be discharged to:: (P) Private residence Living Arrangements: (P) Spouse/significant other Available Help at Discharge: (P) Family;Available 24 hours/day Type of Home: (P) House Home Access: (P) Stairs to enter Entrance Stairs-Number of Steps: (P) 1 Entrance Stairs-Rails: (P) None Home Layout: (P) One level     Bathroom Shower/Tub: (P) Walk-in shower   Bathroom Toilet: (P) Handicapped height     Home Equipment: (P) Walker - 2 wheels;Cane - single point;Bedside commode;Adaptive equipment(per prior admission)          Prior Functioning/Environment Level of Independence: (P) Independent                 OT Problem List: Decreased strength;Decreased activity tolerance;Impaired balance (sitting and/or standing);Decreased range of motion;Decreased coordination;Decreased knowledge of use of DME or AE;Decreased knowledge of precautions;Cardiopulmonary status limiting activity;Increased edema      OT Treatment/Interventions: Self-care/ADL training;Energy conservation;Therapeutic exercise;DME and/or AE instruction;Therapeutic activities;Patient/family education;Balance training    OT Goals(Current goals can be found in the care plan section) Acute Rehab OT Goals Patient Stated Goal: to get stronger OT Goal Formulation: With patient Time For Goal Achievement: 10/10/18 Potential to Achieve Goals: Good  OT Frequency: Min 3X/week   Barriers to D/C:            Co-evaluation  PT/OT/SLP Co-Evaluation/Treatment: Yes Reason for Co-Treatment: (P) Complexity of the patient's impairments (multi-system involvement);For patient/therapist safety;To address functional/ADL transfers PT goals addressed during session: (P) Mobility/safety with mobility;Balance OT goals addressed during session: ADL's and self-care      AM-PAC OT "6 Clicks" Daily Activity     Outcome Measure Help from another person eating meals?: A Little Help from another person taking care of personal grooming?: A Little Help from another person toileting, which includes using toliet, bedpan, or urinal?: Total Help from another person bathing (including washing, rinsing, drying)?: A Lot Help from another person to put on and taking off regular upper body clothing?: A Lot Help from another person to put on and taking off regular lower body clothing?: Total 6 Click Score: 12   End of Session Equipment Utilized During Treatment: Oxygen Nurse Communication: Mobility status;Other (comment)(vitals)  Activity Tolerance: Patient limited by lethargy Patient left: in bed;with call bell/phone within reach;with bed alarm set  OT Visit Diagnosis: Other abnormalities of gait and mobility (R26.89);Muscle weakness (generalized) (M62.81)                Time: 9983-3825 OT Time Calculation (min): 22 min Charges:  OT General Charges $OT Visit: 1 Visit OT Evaluation $OT Eval Moderate Complexity: 1 [place], OT  Acute Rehabilitation Services Pager [phone] Office [phone]   Delight Stare 09/26/2018, 3:46 PM

## 2018-09-26 NOTE — Care Management Important Message (Signed)
Important Message  Patient Details  Name: [name] Rose MRN: [id number] Date of Birth: 05/06/1950   Medicare Important Message Given:  Yes    Memory Argue 09/26/2018, 1:33 PM

## 2018-09-26 NOTE — Progress Notes (Signed)
SLP Cancellation Note  Patient Details Name: [name] Rose MRN: [id number] DOB: 1951-01-22   Cancelled treatment:       Reason Eval/Treat Not Completed: Medical issues which prohibited therapy - per chart review, pt is NPO this morning pending his biopsy. Will f/u as able for diet tolerance and potential advancement.    Venita Sheffield [name] 09/26/2018, 8:23 AM  Pollyann Glen, M.A. [place] Acute Environmental education officer [phone] Office [phone]

## 2018-09-26 NOTE — Sedation Documentation (Signed)
Pt breathing with mouth open etco2 not reading correctly

## 2018-09-26 NOTE — Progress Notes (Signed)
Daily Progress Note   Patient Name: [name] Rose       Date: 09/26/2018 DOB: 30-Oct-1950  Age: 68 y.o. MRN#: [id number] Attending Physician: [name] Aloe, MD Primary Care Physician: [name] Frees, MD Admit Date: 09/08/2018  Reason for Consultation/Follow-up: Establishing goals of care  Subjective:  Prior to meeting with patient I spoke with wife again and reassured her that [name] Rose appears to be heading in a positive direction - but its good to have thought about what he may need and is willing to accept before he requires it.  She understood and agreed.  Met with patient at bedside and added his wife and daughter on by conference call.  We talked about his goals/values.  I asked what functional abilities are so important to you that you can't imagine living without them?  [name] Rose responded that does not want to be in a position where he is unable to do anything for himself.  He also wants to be able to think.  I told him that I interpreted that as he does not want to continue to receive aggressive medical care if he is bed bound and dependent and he agreed.    We talked about his Living Will of 2016.  [name] Rose (wife) asked when that would come into play.  I explained that if the doctor's came to her and expressed that [name] Rose was dying - the Living Will would come into play.  He would not want life support at that time.  We talked about hemodialysis and I asked [name] Rose and [name] Rose to consider whether or not this is something he would want long term.  I explained that dialysis is a form of life support.  A patient dependent on dialysis will likely pass away in 2-3 weeks without it.  Finally we talked about code status.  Specifically we discussed avoiding chest compressions and considering a partial code.    The family will consider this option.  I encouraged the family to call with questions or concerns and committed to follow up with them in a day or two.   Assessment: S/p bone biopsy.  On chopped diet, appears weak and deconditioned.   Patient Profile/HPI:  68 y.o. male  with past medical history of stage 4 bladder cancer, atrial fibrillation  who was admitted on 09/08/2018 with respiratory  failure and fever secondary to pneumonia.  He worsened requiring mechanical ventilation and continuous renal replacement therapy.  At the time of this consultation he has developed anemia severe thrombocyopenia with no apparent bleeding.  Platelets are currently 31K.  He is now starting intermittent HD, and Dr. Marin Rose has requested a bone marrow biopsy.  PMT was consulted for [name] Rose.   He is a full code.  6/16 Now s/p bone marrow biopsy.  Platelets 34k.  Length of Stay: 18  Current Medications: Scheduled Meds:  . chlorhexidine  15 mL Mouth Rinse BID  . Chlorhexidine Gluconate Cloth  6 each Topical Daily  . diltiazem  30 mg Per Tube Q6H  . feeding supplement (PRO-STAT SUGAR FREE 64)  30 mL Per Tube TID  . fentaNYL      . flecainide  50 mg Oral Q12H  . gabapentin  300 mg Per Tube Q12H  . insulin aspart  0-9 Units Subcutaneous Q4H  . mouth rinse  15 mL Mouth Rinse q12n4p  . metoprolol tartrate  2.5 mg Intravenous Once  . midazolam      . multivitamin  1 tablet Oral QHS  . pantoprazole sodium  40 mg Per Tube Q24H  . sodium chloride flush  3 mL Intravenous Q12H    Continuous Infusions: . sodium chloride    . feeding supplement (VITAL 1.5 CAL) Stopped (09/26/18 0001)    PRN Meds: sodium chloride, acetaminophen **OR** acetaminophen, albuterol, bisacodyl, labetalol, metoprolol tartrate, [DISCONTINUED] ondansetron **OR** ondansetron (ZOFRAN) IV, sennosides, sodium chloride flush, sodium chloride flush, zolpidem  Physical Exam       Well developed male, awake, alert, coherent, cooperative.   Mildly  increased work of breathing, port noted in right chest.  Vital Signs: BP 111/70   Pulse 84   Temp 99.3 F (37.4 C) (Axillary)   Resp 20   Ht '6\' 2"'  (1.88 m)   Wt 112.8 kg   SpO2 97%   BMI 31.93 kg/m  SpO2: SpO2: 97 % O2 Device: O2 Device: Nasal Cannula O2 Flow Rate: O2 Flow Rate (L/min): 3 L/min  Intake/output summary:   Intake/Output Summary (Last 24 hours) at 09/26/2018 1340 Last data filed at 09/26/2018 0400 Gross per 24 hour  Intake 931 ml  Output 2396 ml  Net -1465 ml   LBM: Last BM Date: 09/25/18 Baseline Weight: Weight: 113.4 kg Most recent weight: Weight: 112.8 kg       Palliative Assessment/Data:40%      Patient Active Problem List   Diagnosis Date Noted  . Acute renal failure with acute tubular necrosis superimposed on stage 3 chronic kidney disease ([place])   . Metastatic cancer ([place])   . Palliative care encounter   . Acute renal failure ([place])   . Acute respiratory failure with hypoxia ([place])   . Atypical pneumonia   . CKD (chronic kidney disease), stage III ([place]) 09/08/2018  . Mitral valve insufficiency 08/17/2018  . Drug rash   . Essential hypertension 06/23/2018  . Hyperglycemia 06/23/2018  . Mild hyperlipidemia 06/23/2018  . Mucositis due to chemotherapy 06/22/2018  . AKI (acute kidney injury) ([place]) 06/22/2018  . Pancytopenia ([place]) 06/22/2018  . Staphylococcus aureus bacteremia   . Neutropenic fever ([place]) 06/21/2018  . Pneumonia   . Thrombocytopenia ([place])   . PAF (paroxysmal atrial fibrillation) ([place])   . Abscess of abdominal cavity ([place]) 12/19/2017  . IDA (iron deficiency anemia) 09/08/2017  . 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 Anorexia 10/17/2016  .  Diarrhea 10/17/2016  . LFT elevation 10/17/2016  . Dehydration 10/17/2016  . Fatigue 10/17/2016  . Hypocalcemia 10/17/2016  . Goals of care, counseling/discussion 09/30/2016  . 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  ([place]) 09/29/2016  . Cancer of dome of urinary bladder ([place]) 09/20/2016  . Postoperative anemia due to acute blood loss 03/04/2015  . Hypokalemia 03/04/2015  . 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/03/2015    Palliative Care Plan    Recommendations/Plan:  PMT will follow with you.  Patient has stated he would not want to accept aggressive medical care if he was bedbound / dependent / or unable to think for himself.   Goals of Care and Additional Recommendations:  Limitations on Scope of Treatment: Full Scope Treatment  Code Status:  Full code  Prognosis:   Unable to determine   Discharge Planning:  To Be Determined  Care plan was discussed with family.   Thank you for allowing the Palliative Medicine Team to assist in the care of this patient.  Total time spent:  35 min.     Greater than 50%  of this time was spent counseling and coordinating care related to the above assessment and plan.  Florentina Jenny, PA-C Palliative Medicine  Please contact Palliative MedicineTeam phone at [phone] for questions and concerns between 7 am - 7 pm.   Please see AMION for individual provider pager numbers.

## 2018-09-26 NOTE — Evaluation (Signed)
Physical Therapy Evaluation Patient Details Name: [name] Rose MRN: [id number] DOB: [birth year] Today's Date: 09/26/2018   History of Present Illness  Pt is a 68 y/o male presenting with resiratory failure and fever secondary to presumed pneumonia.  Intubated 6/05/18/08, CRRT while in ICU.  Course complicated by anemia and atrial fib with RVR. PMH: prostate cancer with bladder mets, chemo induced mucositits, CKD, HTN, hyperglycemia.     Clinical Impression  Patient admitted with the above listed diagnosis. Patient reports independence at baseline for mobility and ADLs. Patient today demonstrating reduced strength, balance, activity tolerance, and overall reduced functional mobility. Patient today requiring Max/Total A +2 for bed level mobility and to sit EOB. Poor tolerance to sitting unsupported with HR up to 140's and desat on 2L O2 - with return to supine patient with return to baseline vitals. Discussion with patient regarding rehab options at discharge. Will currently recommend CIR at discharge depending on progress. PT to continue to follow.      Follow Up Recommendations CIR    Equipment Recommendations  Other (comment)(TBD)    Recommendations for Other Services Rehab consult     Precautions / Restrictions Precautions Precautions: Fall Restrictions Weight Bearing Restrictions: No      Mobility  Bed Mobility Overal bed mobility: Needs Assistance Bed Mobility: Supine to Sit;Sit to Supine     Supine to sit: Max assist;+2 for physical assistance;HOB elevated Sit to supine: Total assist;+2 for physical assistance   General bed mobility comments: overall max assist +2 to transition to EOB for LB and trunk support, returned to supine with total assist; cueing for sequencing and technique   Transfers                 General transfer comment: deferred due to safety/act [place]  Ambulation/Gait                Stairs            Wheelchair Mobility     Modified Rankin (Stroke Patients Only)       Balance Overall balance assessment: Needs assistance Sitting-balance support: Bilateral upper extremity supported;Feet supported Sitting balance-Leahy Scale: Poor Sitting balance - Comments: max-total assist for sitting EOB with posterior lean  Postural control: Posterior lean                                   Pertinent Vitals/Pain Pain Assessment: No/denies pain Faces Pain Scale: No hurt    Home Living Family/patient expects to be discharged to:: Private residence Living Arrangements: Spouse/significant other Available Help at Discharge: Family;Available 24 hours/day Type of Home: House Home Access: Stairs to enter Entrance Stairs-Rails: None Entrance Stairs-Number of Steps: 1 Home Layout: One level Home Equipment: Walker - 2 wheels;Cane - single point;Bedside commode;Adaptive equipment(per prior admission)      Prior Function Level of Independence: Independent               Hand Dominance   Dominant Hand: Right    Extremity/Trunk Assessment   Upper Extremity Assessment Upper Extremity Assessment: Defer to OT evaluation RUE Deficits / Details: generalized weakness, grossly 3+/5; edema LUE Deficits / Details: generalized weakness, grossly 3-/5 shoulder, 3+/5 elbow/hand; edema      Lower Extremity Assessment Lower Extremity Assessment: RLE deficits/detail;LLE deficits/detail RLE Deficits / Details: generalized weakness; unable to lift against gravity - able to initiate movement with gravity eliminated LLE Deficits / Details: generalized weakness; unable to  lift against gravity - able to initiate movement with gravity eliminated       Communication   Communication: No difficulties  Cognition Arousal/Alertness: Awake/alert Behavior During Therapy: WFL for tasks assessed/performed Overall Cognitive Status: Impaired/Different from baseline Area of Impairment: Problem solving                              Problem Solving: Slow processing;Decreased initiation;Requires verbal cues;Difficulty sequencing        General Comments General comments (skin integrity, edema, etc.): increased HR up to 140 with limited EOB mobility; on 2L O2 via [place] with desat to 85% - quickly rebounds to >90% at supine    Exercises     Assessment/Plan    PT Assessment Patient needs continued PT services  PT Problem List Decreased strength;Decreased activity tolerance;Decreased balance;Decreased mobility;Decreased knowledge of use of DME;Decreased safety awareness       PT Treatment Interventions DME instruction;Gait training;Functional mobility training;Therapeutic activities;Therapeutic exercise;Balance training;Neuromuscular re-education;Patient/family education    PT Goals (Current goals can be found in the Care Plan section)  Acute Rehab PT Goals Patient Stated Goal: to get stronger PT Goal Formulation: With patient Time For Goal Achievement: 10/10/18 Potential to Achieve Goals: Good    Frequency Min 3X/week   Barriers to discharge        Co-evaluation PT/OT/SLP Co-Evaluation/Treatment: Yes Reason for Co-Treatment: Complexity of the patient's impairments (multi-system involvement);For patient/therapist safety;To address functional/ADL transfers PT goals addressed during session: Mobility/safety with mobility;Balance OT goals addressed during session: ADL's and self-care       AM-PAC PT "6 Clicks" Mobility  Outcome Measure Help needed turning from your back to your side while in a flat bed without using bedrails?: A Lot Help needed moving from lying on your back to sitting on the side of a flat bed without using bedrails?: A Lot Help needed moving to and from a bed to a chair (including a wheelchair)?: Total Help needed standing up from a chair using your arms (e.g., wheelchair or bedside chair)?: Total Help needed to walk in hospital room?: Total Help needed climbing 3-5 steps  with a railing? : Total 6 Click Score: 8    End of Session Equipment Utilized During Treatment: Oxygen Activity Tolerance: Patient tolerated treatment well Patient left: in bed;with call bell/phone within reach;with bed alarm set Nurse Communication: Mobility status PT Visit Diagnosis: Unsteadiness on feet (R26.81);Other abnormalities of gait and mobility (R26.89);Muscle weakness (generalized) (M62.81)    Time: 9675-9163 PT Time Calculation (min) (ACUTE ONLY): 22 min   Charges:   PT Evaluation $PT Eval Moderate Complexity: 1 Mod          Lanney Gins, PT, DPT Supplemental Physical Therapist 09/26/18 3:51 PM Pager: [phone] Office: [phone]

## 2018-09-26 NOTE — Progress Notes (Signed)
Progress Note  Patient Name: [name] Rose Date of Encounter: 09/26/2018  Primary Cardiologist: Pixie Casino, MD   Subjective   Breathing is OK   Denies CP    Inpatient Medications    Scheduled Meds: . chlorhexidine  15 mL Mouth Rinse BID  . Chlorhexidine Gluconate Cloth  6 each Topical Daily  . diltiazem  30 mg Per Tube Q6H  . feeding supplement (PRO-STAT SUGAR FREE 64)  30 mL Per Tube TID  . flecainide  50 mg Oral Q12H  . gabapentin  300 mg Per Tube Q12H  . insulin aspart  0-9 Units Subcutaneous Q4H  . mouth rinse  15 mL Mouth Rinse q12n4p  . metoprolol tartrate  2.5 mg Intravenous Once  . pantoprazole sodium  40 mg Per Tube Q24H  . vitamin B-6  300 mg Per Tube Daily  . sodium chloride flush  3 mL Intravenous Q12H   Continuous Infusions: . sodium chloride    . feeding supplement (VITAL 1.5 CAL) Stopped (09/26/18 0001)   PRN Meds: sodium chloride, acetaminophen **OR** acetaminophen, albuterol, bisacodyl, labetalol, metoprolol tartrate, [DISCONTINUED] ondansetron **OR** ondansetron (ZOFRAN) IV, sennosides, sodium chloride flush, sodium chloride flush, zolpidem   Vital Signs    Vitals:   09/25/18 1633 09/25/18 1905 09/25/18 2300 09/26/18 0411  BP: 97/70 127/70 126/72 122/80  Pulse: (!) 104 (!) 107 (!) 104 86  Resp: 19 20 (!) 27 (!) 26  Temp:  98.3 F (36.8 C) 97.9 F (36.6 C) 97.7 F (36.5 C)  TempSrc:  Oral Oral Oral  SpO2: 93% 93% 94% 97%  Weight:    112.8 kg  Height:        Intake/Output Summary (Last 24 hours) at 09/26/2018 0626 Last data filed at 09/26/2018 0400 Gross per 24 hour  Intake 1126 ml  Output 2896 ml  Net -1770 ml   Last 3 Weights 09/26/2018 09/25/2018 09/25/2018  Weight (lbs) 248 lb 10.9 oz 244 lb 7.8 oz 256 lb 2.8 oz  Weight (kg) 112.8 kg 110.9 kg 116.2 kg      Telemetry    Afib  Rates 80s    - Personally Reviewed  ECG    Not done  - Personally Reviewed  Physical Exam   GEN: No acute distress.   Neck: Difficult to assess  JVP due to lines   Cardiac: Irreg irreg , no murmurs, rubs, or gallops.  Respiratory: bilateral rhonchi   GI: Soft, nontender, non-distended  MS:   ++ UE edema  + LE edema; No deformity. Neuro:  Nonfocal  Psych: Normal affect   Labs    Chemistry Recent Labs  Lab 09/23/18 0340 09/24/18 0512 09/25/18 0517 09/26/18 0423  NA 137 136 139 138  K 3.9 3.8 4.1 3.6  CL 103 103 102 101  CO2 $Re'26 24 25 27  'Wzn$ GLUCOSE 183* 198* 195* 117*  BUN 42* 77* 105* 55*  CREATININE 1.60* 3.15* 4.04* 2.34*  CALCIUM 8.1* 7.9* 7.7* 7.6*  PROT 5.1* 4.0* 3.7*  --   ALBUMIN 2.8* 2.2* 2.1* 2.2*  AST 52* 41 40  --   ALT 65* 48* 43  --   ALKPHOS 58 48 51  --   BILITOT 1.0 0.7 0.9  --   GFRNONAA 44* 19* 14* 28*  GFRAA 51* 22* 17* 32*  ANIONGAP $RemoveB'8 9 12 10     'ktqmnouw$ Hematology Recent Labs  Lab 09/24/18 1633 09/25/18 0517 09/26/18 0423  WBC 21.0* 11.6* 10.1  RBC 2.61* 2.53* 2.62*  HGB  8.5* 8.3* 8.7*  HCT 26.5* 26.1* 27.2*  MCV 101.5* 103.2* 103.8*  MCH 32.6 32.8 33.2  MCHC 32.1 31.8 32.0  RDW 22.2* 23.0* 22.8*  PLT 34* 31* 34*    Cardiac EnzymesNo results for input(s): TROPONINI in the last 168 hours. No results for input(s): TROPIPOC in the last 168 hours.   BNPNo results for input(s): BNP, PROBNP in the last 168 hours.   DDimer No results for input(s): DDIMER in the last 168 hours.   Radiology    Dg Chest Port 1 View  Result Date: 09/24/2018 CLINICAL DATA:  Pt sob, hx hypoxia, pneumonia, pt has 3 lines and an ng tube, dialysis Rt ij, rt port and left ij ? getting blood mk rt-r EXAM: PORTABLE CHEST 1 VIEW COMPARISON:  09/20/2018 FINDINGS: Port in the anterior chest wall with tip in distal SVC. LEFT and RIGHT central venous lines with tips in the mid SVC. Feeding tube with tip in the stomach. Normal cardiac silhouette. Bilateral fine airspace disease.  No pneumothorax IMPRESSION: 1. Stable support apparatus. 2. Mild pulmonary edema pattern. Electronically Signed   By: Suzy Bouchard M.D.   On:  09/24/2018 13:34    Cardiac Studies  Relevant CV Studies: Echo 06/22/18 IMPRESSIONS   1. The left ventricle has normal systolic function, with an ejection fraction of 55-60%. The cavity size was normal. Left ventricular diastolic Doppler parameters are consistent with impaired relaxation. 2. The right ventricle has normal systolic function. The cavity was mildly enlarged. There is no increase in right ventricular wall thickness. 3. Right atrial size was mildly dilated. 4. Moderate thickening of the aortic valve Mild calcification of the aortic valve. 5. There is dilatation of the aortic root measuring 39 mm. 6. No vegetations detected.  FINDINGS Left Ventricle: The left ventricle has normal systolic function, with an ejection fraction of 55-60%. The cavity size was normal. There is no increase in left ventricular wall thickness. Left ventricular diastolic Doppler parameters are consistent with  impaired relaxation. Definity contrast agent was given IV to delineate the left ventricular endocardial borders. Right Ventricle: The right ventricle has normal systolic function. The cavity was mildly enlarged. There is no increase in right ventricular wall thickness. Left Atrium: left atrial size was normal in size Right Atrium: right atrial size was mildly dilated. Right atrial pressure is estimated at 3 mmHg. Interatrial Septum: No atrial level shunt detected by color flow Doppler. Pericardium: There is no evidence of pericardial effusion. Mitral Valve: The mitral valve is normal in structure. Mitral valve regurgitation was not assessed by color flow Doppler. Tricuspid Valve: The tricuspid valve is normal in structure. Tricuspid valve regurgitation was not visualized by color flow Doppler. Aortic Valve: The aortic valve is normal in structure. Moderate thickening of the aortic valve Mild calcification of the aortic valve. Aortic valve regurgitation was not visualized by color flow  Doppler. Pulmonic Valve: The pulmonic valve was normal in structure. Pulmonic valve regurgitation is not visualized by color flow Doppler. Aorta: There is dilatation of the aortic root measuring 39 mm. Venous: The inferior vena cava is normal in size with greater than 50% respiratory variability.  TEE 06/27/18 IMPRESSIONS   1. The left ventricle has normal systolic function, with an ejection fraction of 55-60%. 2. Moderate mitral regurgitation. 3. No valvular vegetations seen. 4. 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  FINDINGS Left Ventricle: The left ventricle has normal systolic function, with an ejection fraction of 55-60%. Right Ventricle: The right ventricle has normal  systolic function. Left Atrium: Left atrial size was normal in size. Right Atrium: Right atrial size was normal in size. Interatrial Septum: No atrial level shunt detected by color flow Doppler. Mitral Valve: The mitral valve is grossly normal. Mitral valve regurgitation is moderate by color flow Doppler. There is no evidence of mitral valve vegetation. Tricuspid Valve: The tricuspid valve was grossly normal. Tricuspid valve regurgitation is trivial by color flow Doppler. No TV vegetation was visualized. Aortic Valve: The aortic valve is tricuspid Mild thickening of the aortic valve. There is no stenosis of the aortic valve. There is no evidence of a vegetation on the aortic valve. Pulmonic Valve: The pulmonic valve was grossly normal. Pulmonic valve regurgitation is not visualized by color flow Doppler. Aorta: The ascending aorta is normal in size and structure.  LV Wall Scoring:  MR Peak grad: 109.4 mmHg MR Mean grad: 74.0 mmHg MR Vmax: 523.00 cm/s MR Vmean: 398.0 cm/s   Manish Patwardhan MD Electronically signed by Vernell Leep MD Signature Date/Time: 06/27/2018/5:27:15 PM  Patient Profile     [name] Rose is a 68 y.o. male with a hx of metastatic bladder  cancer and hx of sepsis and MSSA bacteremia, PAF with hospitalization but could not be anticoagulated due anemia and thrombocytopenia (chemo) and rate control alone was planned, not rhythm control, mod MR, on TEE and no endocarditis that admit who is being seen today for the evaluation of atria fib RVR at the request of Dr. Lonny Prude  Assessment & Plan    1  PAF with RVR    Pts CHADS2VASc is 3  Pt is back in afib  Rates controlled   Follw  Not on anticoag due to Low Plts    3  Thrombocytopenia  Plt 34K     4 Anemia  Hgb 8.7   5  Pulmonary  Pt down to 4 L [place]     6  CKD  Underwent HD yesterday  Urinating   Renal following    7   CAD   As found on CTA  No symptoms to sugg angina    For questions or updates, please contact [facility] Please consult [email or web address] for contact info under        Signed, Dorris Carnes, MD  09/26/2018, 6:26 AM

## 2018-09-26 NOTE — Plan of Care (Signed)
  Problem: Education: Goal: Knowledge of General Education information will improve Description: Including pain rating scale, medication(s)/side effects and non-pharmacologic comfort measures Outcome: Progressing   Problem: Health Behavior/Discharge Planning: Goal: Ability to manage health-related needs will improve Outcome: Progressing   Problem: Clinical Measurements: Goal: Ability to maintain clinical measurements within normal limits will improve Outcome: Progressing Goal: Will remain free from infection Outcome: Progressing Goal: Diagnostic test results will improve Outcome: Progressing Goal: Respiratory complications will improve Outcome: Progressing Goal: Cardiovascular complication will be avoided Outcome: Progressing   Problem: Activity: Goal: Risk for activity intolerance will decrease Outcome: Progressing   Problem: Nutrition: Goal: Adequate nutrition will be maintained Outcome: Progressing   Problem: Coping: Goal: Level of anxiety will decrease Outcome: Progressing   Problem: Elimination: Goal: Will not experience complications related to bowel motility Outcome: Progressing Goal: Will not experience complications related to urinary retention Outcome: Progressing   Problem: Safety: Goal: Ability to remain free from injury will improve Outcome: Progressing   Problem: Skin Integrity: Goal: Risk for impaired skin integrity will decrease Outcome: Progressing   Problem: Activity: Goal: Ability to tolerate increased activity will improve Outcome: Progressing   Problem: Clinical Measurements: Goal: Ability to maintain a body temperature in the normal range will improve Outcome: Progressing   Problem: Respiratory: Goal: Ability to maintain adequate ventilation will improve Outcome: Progressing Goal: Ability to maintain a clear airway will improve Outcome: Progressing

## 2018-09-26 NOTE — Progress Notes (Signed)
  Speech Language Pathology Treatment: Dysphagia  Patient Details Name: KAMAREON SCIANDRA MRN: [id number] DOB: Aug 21, 1950 Today's Date: 09/26/2018 Time: 4132-4401 SLP Time Calculation (min) (ACUTE ONLY): 10 min  Assessment / Plan / Recommendation Clinical Impression  Pt consumed soft solids and thin liquids with Min cues provided for alternating bites/sips. No throat clearing was noted today as was noted on previous date. He seems to be doing well overall with current consistencies, still looking overall deconditioned but denying any increased fatigue with meals. Would continue mechanical soft diet and thin liquids for now, but anticipate quick progression to regular solids.   HPI HPI: pt is a 68 yo male adm to [facility] with respiratory difficulties- diagnosed with pna and developed renal failure.  PMH + for prostate cancer with bladder mets, chemo induced mucositis, - he required intubation and is on CRRT at this time.  Pt intubated 6/2-6/01/2019.    Pt has been on chemo and is hyperglycemic due to steroids. Swallow eval ordered.  Pt had OG when intubated but it was removed with extubation.      SLP Plan  Continue with current plan of care       Recommendations  Diet recommendations: Dysphagia 3 (mechanical soft);Thin liquid Liquids provided via: Cup;Straw Medication Administration: Whole meds with puree Supervision: Staff to assist with self feeding Compensations: Slow rate;Small sips/bites;Follow solids with liquid Postural Changes and/or Swallow Maneuvers: Seated upright 90 degrees;Upright 30-60 min after meal                Oral Care Recommendations: Oral care BID Follow up Recommendations: (tba) SLP Visit Diagnosis: Dysphagia, unspecified (R13.10) Plan: Continue with current plan of care       [place] Jermal Dismuke 09/26/2018, 12:40 PM  Pollyann Glen, M.A. [place] Acute Environmental education officer [phone] Office [phone]

## 2018-09-26 NOTE — Progress Notes (Signed)
Rehab Admissions Coordinator Note:  Patient was screened by Cleatrice Burke for appropriateness for an Inpatient Acute Rehab Consult per PT and OT. I will follow pt's tolerance/Abilities to participate in therapies to assist with planning dispo.   Cleatrice Burke RN MSN 09/26/2018, 4:16 PM  I can be reached at [phone].

## 2018-09-26 NOTE — TOC Progression Note (Addendum)
Transition of Care [facility]) - Progression Note    Patient Details  Name: [name] Rose MRN: [id number] Date of Birth: Mar 05, 1951  [facility] CM/SW Contact  Maryclare Labrador, RN Phone Number: 09/26/2018, 2:58 PM  Clinical Narrative:   Pt transferred from [facility] after extensive stay - current CA pt.  Pt is now s/p bone biobsy secondary to dropping platelet count.  Pt remains on IVF, tube feeds via NG tube however pt passed swallow study 6/15 and recently started on dysphagia diet - NG could potentially be removed soon.   Pt is also now requiring IHD - it hasn't been determined yet if this will be permanent.  Palliative care following and facilitating [place] discussions.  CM requested PT/OT eval.  CM will continue to follow     Expected Discharge Plan: [place] Barriers to Discharge: Continued Medical Work up  Expected Discharge Plan and Services Expected Discharge Plan: [place] arrangements for the past 2 months: Single Family Home Expected Discharge Date: (unknown)                                     Social Determinants of Health (SDOH) Interventions    Readmission Risk Interventions Readmission Risk Prevention Plan 09/08/2018 06/28/2018  Transportation Screening Complete Complete  PCP or Specialist Appt within 3-5 Days - Complete  HRI or [place] - Complete  Social Work Consult for [place] Planning/Counseling - Complete  Palliative Care Screening - Not Applicable  Medication Review Press photographer) Complete Complete  PCP or Specialist appointment within 3-5 days of discharge Not Complete -  PCP/Specialist Appt Not Complete comments not yet ready for d/c -  HRI or [place] Not Complete -  HRI or Home Care Consult Pt Refusal Comments no needs identified at this time -  SW Recovery Care/Counseling Consult Not Complete -  SW Consult Not Complete Comments no needs identified at this time  -  Palliative Care Screening Not Applicable -  [place] Not Applicable -  Some recent data might be hidden

## 2018-09-27 LAB — GLUCOSE, CAPILLARY
Glucose-Capillary: 114 mg/dL — ABNORMAL HIGH (ref 70–99)
Glucose-Capillary: 168 mg/dL — ABNORMAL HIGH (ref 70–99)
Glucose-Capillary: 133 mg/dL — ABNORMAL HIGH (ref 70–99)
Glucose-Capillary: 130 mg/dL — ABNORMAL HIGH (ref 70–99)
Glucose-Capillary: 143 mg/dL — ABNORMAL HIGH (ref 70–99)

## 2018-09-27 LAB — RENAL FUNCTION PANEL
Albumin: 2 g/dL — ABNORMAL LOW (ref 3.5–5.0)
Anion gap: 10 (ref 5–15)
BUN: 77 mg/dL — ABNORMAL HIGH (ref 8–23)
CO2: 25 mmol/L (ref 22–32)
Calcium: 7.8 mg/dL — ABNORMAL LOW (ref 8.9–10.3)
Chloride: 103 mmol/L (ref 98–111)
Creatinine, Ser: 3.34 mg/dL — ABNORMAL HIGH (ref 0.61–1.24)
GFR calc Af Amer: 21 mL/min — ABNORMAL LOW (ref >60)
GFR calc non Af Amer: 18 mL/min — ABNORMAL LOW (ref >60)
Glucose, Bld: 103 mg/dL — ABNORMAL HIGH (ref 70–99)
Phosphorus: 4.6 mg/dL (ref 2.5–4.6)
Potassium: 3.5 mmol/L (ref 3.5–5.1)
Sodium: 138 mmol/L (ref 135–145)

## 2018-09-27 LAB — CBC
HCT: 26.2 % — ABNORMAL LOW (ref 39.0–52.0)
Hemoglobin: 8.2 g/dL — ABNORMAL LOW (ref 13.0–17.0)
MCH: 32.7 pg (ref 26.0–34.0)
MCHC: 31.3 g/dL (ref 30.0–36.0)
MCV: 104.4 fL — ABNORMAL HIGH (ref 80.0–100.0)
Platelets: 35 10*3/uL — ABNORMAL LOW (ref 150–400)
RBC: 2.51 MIL/uL — ABNORMAL LOW (ref 4.22–5.81)
RDW: 22.6 % — ABNORMAL HIGH (ref 11.5–15.5)
WBC: 8 10*3/uL (ref 4.0–10.5)
nRBC: 0 % (ref 0.0–0.2)

## 2018-09-27 LAB — LACTATE DEHYDROGENASE: LDH: 368 U/L — ABNORMAL HIGH (ref 98–192)

## 2018-09-27 MED ORDER — ENSURE ENLIVE PO LIQD
237.0000 mL | Freq: Two times a day (BID) | ORAL | Status: DC
  Administered 2018-09-27 – 2018-10-01 (×5): 237 mL via ORAL

## 2018-09-27 MED ORDER — GABAPENTIN 300 MG PO CAPS
300.0000 mg | ORAL_CAPSULE | Freq: Two times a day (BID) | ORAL | Status: DC
  Administered 2018-09-27 – 2018-10-02 (×12): 300 mg via ORAL
  Filled 2018-09-27 (×12): qty 1

## 2018-09-27 MED ORDER — PRO-STAT SUGAR FREE PO LIQD
30.0000 mL | Freq: Every day | ORAL | Status: DC
  Administered 2018-09-27 – 2018-10-03 (×6): 30 mL via ORAL
  Filled 2018-09-27 (×7): qty 30

## 2018-09-27 MED ORDER — PANTOPRAZOLE SODIUM 40 MG PO TBEC
40.0000 mg | DELAYED_RELEASE_TABLET | Freq: Every day | ORAL | Status: DC
  Administered 2018-09-27 – 2018-10-14 (×18): 40 mg via ORAL
  Filled 2018-09-27 (×18): qty 1

## 2018-09-27 MED ORDER — INSULIN ASPART 100 UNIT/ML ~~LOC~~ SOLN
0.0000 [IU] | Freq: Three times a day (TID) | SUBCUTANEOUS | Status: DC
  Administered 2018-09-28: 1 [IU] via SUBCUTANEOUS
  Administered 2018-09-28 – 2018-09-29 (×2): 2 [IU] via SUBCUTANEOUS
  Administered 2018-09-29 – 2018-09-30 (×5): 1 [IU] via SUBCUTANEOUS
  Administered 2018-10-01 – 2018-10-02 (×3): 2 [IU] via SUBCUTANEOUS
  Administered 2018-10-02 – 2018-10-03 (×3): 1 [IU] via SUBCUTANEOUS
  Administered 2018-10-04 – 2018-10-05 (×2): 2 [IU] via SUBCUTANEOUS
  Administered 2018-10-06 – 2018-10-07 (×3): 1 [IU] via SUBCUTANEOUS
  Administered 2018-10-07: 07:00:00 2 [IU] via SUBCUTANEOUS
  Administered 2018-10-07 – 2018-10-11 (×7): 1 [IU] via SUBCUTANEOUS
  Administered 2018-10-11: 2 [IU] via SUBCUTANEOUS
  Administered 2018-10-12 – 2018-10-13 (×3): 1 [IU] via SUBCUTANEOUS
  Administered 2018-10-13: 2 [IU] via SUBCUTANEOUS
  Administered 2018-10-14: 1 [IU] via SUBCUTANEOUS

## 2018-09-27 MED ORDER — FUROSEMIDE 80 MG PO TABS
160.0000 mg | ORAL_TABLET | Freq: Three times a day (TID) | ORAL | Status: DC
  Administered 2018-09-27 – 2018-09-30 (×10): 160 mg via ORAL
  Filled 2018-09-27 (×10): qty 2

## 2018-09-27 NOTE — Progress Notes (Signed)
Subjective: Interval History: has no complaint, doing better, up some.  Objective: Vital signs in last 24 hours: Temp:  [97.6 F (36.4 C)-98.2 F (36.8 C)] 97.6 F (36.4 C) (06/17 0718) Pulse Rate:  [69-100] 100 (06/17 0718) Resp:  [16-25] 22 (06/17 0718) BP: (106-158)/(64-124) 122/69 (06/17 0718) SpO2:  [93 %-100 %] 93 % (06/17 0718) Weight:  [114.8 kg] 114.8 kg (06/17 0610) Weight change: 3.9 kg  Intake/Output from previous day: 06/16 0701 - 06/17 0700 In: 243 [P.O.:240; I.V.:3] Out: 1150 [Urine:1150] Intake/Output this shift: No intake/output data recorded.  General appearance: alert, cooperative, no distress, moderately obese and pale Neck: IJ cath Resp: rales bilaterally Cardio: S1, S2 normal GI: obese, pos bs, soft, liver ,down 5 cm Extremities: edema 3+  Irreg/irreg rhythm  Lab Results: Recent Labs    09/26/18 0423 09/27/18 0402  WBC 10.1 8.0  HGB 8.7* 8.2*  HCT 27.2* 26.2*  PLT 34* 35*   BMET:  Recent Labs    09/26/18 1105 09/27/18 0402  NA 131* 138  K 3.3* 3.5  CL 97* 103  CO2 26 25  GLUCOSE 102* 103*  BUN 55* 77*  CREATININE 2.41* 3.34*  CALCIUM 7.4* 7.8*   Recent Labs    09/25/18 1210  PTH 96*   Iron Studies: No results for input(s): IRON, TIBC, TRANSFERRIN, FERRITIN in the last 72 hours.  Studies/Results: Ct Bone Marrow Biopsy & Aspiration  Result Date: 09/26/2018 INDICATION: 68 year old with history of bladder cancer, renal failure and thrombocytopenia. EXAM: CT GUIDED BONE MARROW ASPIRATES AND BIOPSY Physician: Stephan Minister. Anselm Pancoast, MD MEDICATIONS: None. ANESTHESIA/SEDATION: Fentanyl 50 mcg IV; Versed 1.0 mg IV Moderate Sedation Time:  12 minutes The patient was continuously monitored during the procedure by the interventional radiology nurse under my direct supervision. COMPLICATIONS: None immediate. PROCEDURE: The procedure was explained to the patient. The risks and benefits of the procedure were discussed and the patient's questions were  addressed. Informed consent was obtained from the patient. The patient was placed on the CT table with left side down. Images of the pelvis were obtained. The back was prepped and draped in sterile fashion. The skin and left posterior ilium were anesthetized with 1% lidocaine. 11 gauge bone needle was directed into the left ilium with CT guidance. Two aspirates and one core biopsy were obtained. Bandage placed over the puncture site. IMPRESSION: CT guided bone marrow aspiration and core biopsy. Electronically Signed   By: Markus Daft M.D.   On: 09/26/2018 11:44    I have reviewed the patient's current medications.  Assessment/Plan: 1 AKI appears to have some function.  Nonoliguric.  Vol xs acid/base/k ok. Hold off HD 2 Bladder Ca per Onc 3 obesity 4 Anemia 5 low ptlt per onc 6 Sepsis resolved P follow urine, Lasix to see if will help vol    LOS: 19 days   [place] 09/27/2018,8:18 AM

## 2018-09-27 NOTE — Care Management Important Message (Deleted)
Important Message  Patient Details  Name: [name] Rose MRN: [id number] Date of Birth: 09-04-1950   Medicare Important Message Given:  Yes    Memory Argue 09/27/2018, 2:02 PM

## 2018-09-27 NOTE — Progress Notes (Signed)
  Speech Language Pathology Treatment: Dysphagia  Patient Details Name: [name] Rose MRN: [id number] DOB: 04-07-1951 Today's Date: 09/27/2018 Time: 1700-1749 SLP Time Calculation (min) (ACUTE ONLY): 16 min  Assessment / Plan / Recommendation Clinical Impression  SLP provided skilled observation during dinner meal. Pt requested set-up assist, stating that he could not open containers, but he did feed himself once meal tray was prepared. Pt had no overt signs of aspiration during meal and had mildly prolonged but adequate oral preparation. He did have mild desaturations during intake, going from around 90% SpO2 down to 84% at his lowest, but returning to baseline with Min cues for rest breaks and deep inhalations through his nose, in between bites. Suspect that this is less related to airway compromise and more related to generalized deconditioning - also, perhaps mouth-breathing noted during mastication. We discussed additional strategies for energy conservation. Although swallow function overall appears functional, his respiratory status and decreased endurance due present more risk for intermittent aspiration across meal trays. Would continue Dys 3 diet and thin liquids for now, but still anticipate good prognosis for advancement.    HPI HPI: pt is a 68 yo male adm to [facility] with respiratory difficulties- diagnosed with pna and developed renal failure.  PMH + for prostate cancer with bladder mets, chemo induced mucositis, - he required intubation and is on CRRT at this time.  Pt intubated 6/2-6/01/2019.    Pt has been on chemo and is hyperglycemic due to steroids. Swallow eval ordered.  Pt had OG when intubated but it was removed with extubation.      SLP Plan  Continue with current plan of care       Recommendations  Diet recommendations: Dysphagia 3 (mechanical soft);Thin liquid Liquids provided via: Cup;Straw Medication Administration: Whole meds with puree Supervision: Staff to assist  with self feeding Compensations: Slow rate;Small sips/bites;Follow solids with liquid Postural Changes and/or Swallow Maneuvers: Seated upright 90 degrees;Upright 30-60 min after meal                Oral Care Recommendations: Oral care BID Follow up Recommendations: Inpatient Rehab SLP Visit Diagnosis: Dysphagia, unspecified (R13.10) Plan: Continue with current plan of care       GO                Venita Sheffield Emanuel Dowson 09/27/2018, 4:56 PM  Pollyann Glen, M.A. [place] Acute Environmental education officer [phone] Office [phone]

## 2018-09-27 NOTE — Progress Notes (Signed)
Nutrition Follow-up  DOCUMENTATION CODES:   Obesity unspecified  INTERVENTION:   - Ensure Enlive po BID, each supplement provides 350 kcal and 20 grams of protein  - Pro-stat 30 ml daily, each supplement provides 100 kcal and 15 grams of protein  - Encourage adequate PO intake  NUTRITION DIAGNOSIS:   Inadequate oral intake related to inability to eat as evidenced by NPO status.  Progressing, pt is now on a Dysphagia 3 diet  GOAL:   Patient will meet greater than or equal to 90% of their needs  Progressing  MONITOR:   Labs, Weight trends, TF tolerance, I & O's  REASON FOR ASSESSMENT:   Consult Enteral/tube feeding initiation and management  ASSESSMENT:   68 y.o. male with medical history significant of hypertension, hyperlipidemia, prediabetes, prostate cancer, metastasized bladder cancer on chemotherapy, PAF, CKD stage III, MSSA bacteremia.  Patient presented secondary to significant weakness overnight. Admitted for pneumonia.  6/08 - CRRT initiated 6/10 - extubated 6/12 - NGT placed, TF started 6/13 - CRRT d/c, transferred to [facility] 6/15 - diet advanced to Dysphagia 3 6/16 - s/p bone marrow biopsy  Per Nephrology note, holding HD today. NGT removed this AM.  Spoke with pt at bedside. Pt reports that he is glad to have the NGT removed and to be eating again.  Pt reports he did "okay" with breakfast this morning which he clarifies as eating "a little more than half."  Pt amenable to receiving an oral nutrition supplement during admission to aid in meeting kcal and protein needs. RD educated pt regarding his high calorie and protein needs and the importance of adequate PO intake in meeting his needs, maintaining lean muscle mass, and promoting healing. Pt expresses understanding. Pt states he will try the chocolate Ensure Enlive.  Pt requesting to be repositioned and/or get out of bed due to discomfort. RD alerted RN of pt's request.  Pt with deep pitting edema to  BLE. Weight fairly stable compared to admission weight.  Meal Completion: 50% x 1 meal recorded  Medications reviewed and include: Lasix, SSI, rena-vit, Protonix  Labs reviewed: hemoglobin 8.2 CBG's: 101-168 x 24 hours  UOP: 1150 ml x 24 hours I/O's: -4.9 L since admit  Diet Order:   Diet Order            DIET DYS 3 Room service appropriate? Yes; Fluid consistency: Thin  Diet effective now              EDUCATION NEEDS:   Not appropriate for education at this time  Skin:  Skin Assessment:  Skin Integrity Issues: Incisions: closed incision to buttocks  Last BM:  09/26/18  Height:   Ht Readings from Last 1 Encounters:  09/13/18 _0  (1.88 m)    Weight:   Wt Readings from Last 1 Encounters:  09/27/18 114.8 kg    Ideal Body Weight:  86.3 kg  BMI:  Body mass index is 32.49 kg/m.  Estimated Nutritional Needs:   Kcal:  2380-2610 kcal  Protein:  125-140 grams  Fluid:  UOP + 1000 ml    Gaynell Face, MS, RD, LDN Inpatient Clinical Dietitian Pager: [phone] Weekend/After Hours: [phone]

## 2018-09-27 NOTE — Progress Notes (Signed)
Progress Note  Patient Name: [name] Rose Date of Encounter: 09/27/2018  Primary Cardiologist: Pixie Casino, MD   Subjective   Breathing is OK  No CP     Inpatient Medications    Scheduled Meds:  chlorhexidine  15 mL Mouth Rinse BID   Chlorhexidine Gluconate Cloth  6 each Topical Daily   diltiazem  30 mg Per Tube Q6H   flecainide  50 mg Oral Q12H   gabapentin  300 mg Per Tube Q12H   insulin aspart  0-9 Units Subcutaneous Q4H   mouth rinse  15 mL Mouth Rinse q12n4p   metoprolol tartrate  2.5 mg Intravenous Once   multivitamin  1 tablet Oral QHS   pantoprazole sodium  40 mg Per Tube Q24H   sodium chloride flush  3 mL Intravenous Q12H   Continuous Infusions:  sodium chloride     PRN Meds: sodium chloride, acetaminophen **OR** acetaminophen, albuterol, bisacodyl, labetalol, metoprolol tartrate, [DISCONTINUED] ondansetron **OR** ondansetron (ZOFRAN) IV, sennosides, sodium chloride flush, sodium chloride flush, zolpidem   Vital Signs    Vitals:   09/26/18 2332 09/27/18 0308 09/27/18 0610 09/27/18 0718  BP: 106/90 127/71  122/69  Pulse: 93 69  100  Resp: 16 16  (!) 22  Temp: 98.2 F (36.8 C) 97.6 F (36.4 C)  97.6 F (36.4 C)  TempSrc: Oral Axillary  Axillary  SpO2: 94% 100%  93%  Weight:   114.8 kg   Height:        Intake/Output Summary (Last 24 hours) at 09/27/2018 0726 Last data filed at 09/26/2018 2149 Gross per 24 hour  Intake 3 ml  Output 850 ml  Net -847 ml   Last 3 Weights 09/27/2018 09/26/2018 09/25/2018  Weight (lbs) 253 lb 1.4 oz 248 lb 10.9 oz 244 lb 7.8 oz  Weight (kg) 114.8 kg 112.8 kg 110.9 kg      Telemetry     Afib   80s  Personally Reviewed  ECG    Not done  - Personally Reviewed  Physical Exam   GEN: No acute distress.   Neck: Difficult to assess JVP due to lines   Cardiac: Irreg irreg , no murmurs, rubs, or gallops.  Respiratory: bilateral rhonchi   GI: Soft, nontender, non-distended  MS:   2+ UE edema 1-2 + LE  edema; No deformity. Neuro:  Nonfocal  Psych: Normal affect   Labs    Chemistry Recent Labs  Lab 09/23/18 0340 09/24/18 0512 09/25/18 0517 09/26/18 0423 09/26/18 1105 09/27/18 0402  NA 137 136 139 138 131* 138  K 3.9 3.8 4.1 3.6 3.3* 3.5  CL 103 103 102 101 97* 103  CO2 '26 24 25 27 26 25  ' GLUCOSE 183* 198* 195* 117* 102* 103*  BUN 42* 77* 105* 55* 55* 77*  CREATININE 1.60* 3.15* 4.04* 2.34* 2.41* 3.34*  CALCIUM 8.1* 7.9* 7.7* 7.6* 7.4* 7.8*  PROT 5.1* 4.0* 3.7*  --   --   --   ALBUMIN 2.8* 2.2* 2.1* 2.2* 2.2* 2.0*  AST 52* 41 40  --   --   --   ALT 65* 48* 43  --   --   --   ALKPHOS 58 48 51  --   --   --   BILITOT 1.0 0.7 0.9  --   --   --   GFRNONAA 44* 19* 14* 28* 27* 18*  GFRAA 51* 22* 17* 32* 31* 21*  ANIONGAP '8 9 12 10 8 ' 10  Hematology Recent Labs  Lab 09/25/18 0517 09/26/18 0423 09/27/18 0402  WBC 11.6* 10.1 8.0  RBC 2.53* 2.62* 2.51*  HGB 8.3* 8.7* 8.2*  HCT 26.1* 27.2* 26.2*  MCV 103.2* 103.8* 104.4*  MCH 32.8 33.2 32.7  MCHC 31.8 32.0 31.3  RDW 23.0* 22.8* 22.6*  PLT 31* 34* 35*    Cardiac EnzymesNo results for input(s): TROPONINI in the last 168 hours. No results for input(s): TROPIPOC in the last 168 hours.   BNPNo results for input(s): BNP, PROBNP in the last 168 hours.   DDimer No results for input(s): DDIMER in the last 168 hours.   Radiology    Ct Bone Marrow Biopsy & Aspiration  Result Date: 09/26/2018 INDICATION: 68 year old with history of bladder cancer, renal failure and thrombocytopenia. EXAM: CT GUIDED BONE MARROW ASPIRATES AND BIOPSY Physician: Stephan Minister. Anselm Pancoast, MD MEDICATIONS: None. ANESTHESIA/SEDATION: Fentanyl 50 mcg IV; Versed 1.0 mg IV Moderate Sedation Time:  12 minutes The patient was continuously monitored during the procedure by the interventional radiology nurse under my direct supervision. COMPLICATIONS: None immediate. PROCEDURE: The procedure was explained to the patient. The risks and benefits of the procedure were  discussed and the patient's questions were addressed. Informed consent was obtained from the patient. The patient was placed on the CT table with left side down. Images of the pelvis were obtained. The back was prepped and draped in sterile fashion. The skin and left posterior ilium were anesthetized with 1% lidocaine. 11 gauge bone needle was directed into the left ilium with CT guidance. Two aspirates and one core biopsy were obtained. Bandage placed over the puncture site. IMPRESSION: CT guided bone marrow aspiration and core biopsy. Electronically Signed   By: Markus Daft M.D.   On: 09/26/2018 11:44    Cardiac Studies  Relevant CV Studies: Echo 06/22/18 IMPRESSIONS   1. The left ventricle has normal systolic function, with an ejection fraction of 55-60%. The cavity size was normal. Left ventricular diastolic Doppler parameters are consistent with impaired relaxation. 2. The right ventricle has normal systolic function. The cavity was mildly enlarged. There is no increase in right ventricular wall thickness. 3. Right atrial size was mildly dilated. 4. Moderate thickening of the aortic valve Mild calcification of the aortic valve. 5. There is dilatation of the aortic root measuring 39 mm. 6. No vegetations detected.  FINDINGS Left Ventricle: The left ventricle has normal systolic function, with an ejection fraction of 55-60%. The cavity size was normal. There is no increase in left ventricular wall thickness. Left ventricular diastolic Doppler parameters are consistent with  impaired relaxation. Definity contrast agent was given IV to delineate the left ventricular endocardial borders. Right Ventricle: The right ventricle has normal systolic function. The cavity was mildly enlarged. There is no increase in right ventricular wall thickness. Left Atrium: left atrial size was normal in size Right Atrium: right atrial size was mildly dilated. Right atrial pressure is estimated at 3  mmHg. Interatrial Septum: No atrial level shunt detected by color flow Doppler. Pericardium: There is no evidence of pericardial effusion. Mitral Valve: The mitral valve is normal in structure. Mitral valve regurgitation was not assessed by color flow Doppler. Tricuspid Valve: The tricuspid valve is normal in structure. Tricuspid valve regurgitation was not visualized by color flow Doppler. Aortic Valve: The aortic valve is normal in structure. Moderate thickening of the aortic valve Mild calcification of the aortic valve. Aortic valve regurgitation was not visualized by color flow Doppler. Pulmonic Valve: The pulmonic valve was  normal in structure. Pulmonic valve regurgitation is not visualized by color flow Doppler. Aorta: There is dilatation of the aortic root measuring 39 mm. Venous: The inferior vena cava is normal in size with greater than 50% respiratory variability.  TEE 06/27/18 IMPRESSIONS   1. The left ventricle has normal systolic function, with an ejection fraction of 55-60%. 2. Moderate mitral regurgitation. 3. No valvular vegetations seen. 4. 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  FINDINGS Left Ventricle: The left ventricle has normal systolic function, with an ejection fraction of 55-60%. Right Ventricle: The right ventricle has normal systolic function. Left Atrium: Left atrial size was normal in size. Right Atrium: Right atrial size was normal in size. Interatrial Septum: No atrial level shunt detected by color flow Doppler. Mitral Valve: The mitral valve is grossly normal. Mitral valve regurgitation is moderate by color flow Doppler. There is no evidence of mitral valve vegetation. Tricuspid Valve: The tricuspid valve was grossly normal. Tricuspid valve regurgitation is trivial by color flow Doppler. No TV vegetation was visualized. Aortic Valve: The aortic valve is tricuspid Mild thickening of the aortic valve. There is no stenosis of the aortic  valve. There is no evidence of a vegetation on the aortic valve. Pulmonic Valve: The pulmonic valve was grossly normal. Pulmonic valve regurgitation is not visualized by color flow Doppler. Aorta: The ascending aorta is normal in size and structure.  LV Wall Scoring:  MR Peak grad: 109.4 mmHg MR Mean grad: 74.0 mmHg MR Vmax: 523.00 cm/s MR Vmean: 398.0 cm/s   Manish Patwardhan MD Electronically signed by Vernell Leep MD Signature Date/Time: 06/27/2018/5:27:15 PM  Patient Profile     [name] Rose is a 68 y.o. male with a hx of metastatic bladder cancer and hx of sepsis and MSSA bacteremia, PAF with hospitalization but could not be anticoagulated due anemia and thrombocytopenia (chemo) and rate control alone was planned, not rhythm control, mod MR, on TEE and no endocarditis that admit who is being seen today for the evaluation of atria fib RVR at the request of Dr. Lonny Prude  Assessment & Plan    1  PAF with RVR    Currently in afib  Rates controlled  Pts CHADS2VASc is 3   No anticoagulation due to low plts  3  Thrombocytopenia     Plt 35 K    4 Anemia  Hgb 8.2    5   CKD Renal following  Some urine output  [place] on HD    7   CAD   As found on CTA  No symptoms to sugg angina    For questions or updates, please contact [facility] Please consult [email or web address] for contact info under        Signed, Dorris Carnes, MD  09/27/2018, 7:26 AM

## 2018-09-27 NOTE — Progress Notes (Signed)
PROGRESS NOTE    RAMONDO Rose  ZOX:[id number] DOB: 1951-01-05 DOA: 09/08/2018 PCP: Shirline Frees, MD   Brief Narrative: [name] Rose is a 68 y.o. male who presented with respiratory failure and fever secondary to presumed pneumonia. He was started empirically with antibiotics. COVID-19 negative. He worsened requiring mechanical ventilation and CRRT for acute renal failure. He was successfully transfused with plans to start intermittent HD for continued renal failure. Course has also been complicated by anemia and atrial fibrillation with RVR.   Assessment & Plan:   Principal Problem:   Pneumonia Active Problems:   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 (HCC)   LFT elevation   AKI (acute kidney injury) ([place])   CKD (chronic kidney disease), stage III (HCC)   Acute respiratory failure with hypoxia (HCC)   Acute renal failure (HCC)   Acute renal failure with acute tubular necrosis superimposed on stage 3 chronic kidney disease ([place])   Metastatic cancer ([place])   Palliative care encounter   Pneumonia Treated empirically with antibiotics. COVID-19 negative x2. ID consulted and recommending discontinuation of antibiotics on 6/8.  Acute respiratory failure with hypoxia Unsure of etiology, but likely pneumonia. Required mechanical ventilation from 6/2 to 6/10. Steroids burst completed. Acute worsening secondary to below -Keep O2 saturations >92% -Wean to room air as able  Atrial fibrillation w/ RVR Paroxysmal atrial fibrillation on metoprolol and diltiazem as an outpatient. Recurrent episodes of RVR. Currently rate controlled. -Continue diltiazem -Cardiology consult/recommendations: started Flecainide   Hypotension Acutely worsened since giving metoprolol in addition to anemia. Resolved. -Blood transfusion as mentioned below  Acute kidney injury on CKD stage 3 Baseline creatinine of 1.7-1.8. Creatinine of 2.3 on admission in setting of hypotension.  Progressed to requiring CRRT while in the ICU. Nephrology on board. Now transitioning to IHD. Patient with 1.15L UOP over the last 24 hours -Nephrology recommendations: HD decision daily; lasix  Acute on chronic anemia Baseline hemoglobin of 9-10. Down to 7.8 today. Unknown etiology. No bleeding noted. Hematology on board with concern for possible renal etiology vs possible BM etiology. S/p 4 units of PRBC to date. -CBC daily  Chronic thrombocytopenia Stable. Unknown etiology. -Hematology recommendations: BM biopsy planned today  Essential hypertension Hypotension improved with resolution of RVR and transfusion. On diltiazem, metoprolol, losartan as an outpatient. -Continue diltiazem  Metastatic bladder cancer Currently on chemotherapy treatment. Follows with [name]. Last chemotherapy treatment was 5/18 per patient.  Dysphagia Severe aspiration risk per SLP evaluation on 6/12. Currently on tube feeds but advanced to a dysphagia 3 diet on 6/15 -Remove NG tube   DVT prophylaxis: SCDs Code Status:   Code Status: Full Code Family Communication: None Disposition Plan: Discharge pending further clinical improvement and pending renal function improvement vs transition to ESRD   Consultants:   PCCM  Nephrology  Cardiology  Palliative care  Procedures:   CRRT  Antimicrobials:  Vancomycin  Cefepime  Bactrim    Subjective: No concerns today  Objective: Vitals:   09/26/18 2332 09/27/18 0308 09/27/18 0610 09/27/18 0718  BP: 106/90 127/71  122/69  Pulse: 93 69  100  Resp: 16 16  (!) 22  Temp: 98.2 F (36.8 C) 97.6 F (36.4 C)  97.6 F (36.4 C)  TempSrc: Oral Axillary  Axillary  SpO2: 94% 100%  93%  Weight:   114.8 kg   Height:        Intake/Output Summary (Last 24 hours) at 09/27/2018 0952 Last data filed at 09/27/2018 0700 Gross per 24 hour  Intake 243 ml  Output 1150 ml  Net -907 ml   Filed Weights   09/25/18 1548 09/26/18 0411 09/27/18 0610   Weight: 110.9 kg 112.8 kg 114.8 kg    Examination:  General exam: Appears calm and comfortable Respiratory system: [place]. Respiratory effort normal. Cardiovascular system: S1 & S2 heard, Irregular rhythm, normal rate. No murmurs, rubs, gallops or clicks. Gastrointestinal system: Abdomen is nondistended, soft and nontender. No organomegaly or masses felt. Normal bowel sounds heard. Central nervous system: Alert and oriented. No focal neurological deficits. Extremities: No calf tenderness Skin: No cyanosis. No rashes Psychiatry: Judgement and insight appear normal. Mood & affect appropriate.    Data Reviewed: I have personally reviewed following labs and imaging studies  CBC: Recent Labs  Lab 09/22/18 0320 09/23/18 0340 09/24/18 0512 09/24/18 1633 09/25/18 0517 09/26/18 0423 09/27/18 0402  WBC 12.8* 16.7* 13.1* 21.0* 11.6* 10.1 8.0  NEUTROABS 10.9* 15.0* 11.4*  --  9.9*  --   --   HGB 7.8* 7.9* 6.6* 8.5* 8.3* 8.7* 8.2*  HCT 25.1* 25.1* 21.4* 26.5* 26.1* 27.2* 26.2*  MCV 100.8* 104.1* 107.0* 101.5* 103.2* 103.8* 104.4*  PLT 50* 43* 36* 34* 31* 34* 35*   Basic Metabolic Panel: Recent Labs  Lab 09/21/18 0306  09/22/18 0320 09/22/18 1102 09/22/18 1617 09/23/18 0340 09/24/18 0512 09/25/18 0517 09/26/18 0423 09/26/18 1105 09/27/18 0402  NA 138   < > 137  --  136 137 136 139 138 131* 138  K 5.3*   < > 4.5  --  4.2 3.9 3.8 4.1 3.6 3.3* 3.5  CL 104   < > 102  --  101 103 103 102 101 97* 103  CO2 25   < > 27  --  '25 26 24 25 27 26 25  ' GLUCOSE 107*   < > 138*  --  157* 183* 198* 195* 117* 102* 103*  BUN 59*   < > 45*  --  40* 42* 77* 105* 55* 55* 77*  CREATININE 1.62*   < > 1.55*  --  1.77* 1.60* 3.15* 4.04* 2.34* 2.41* 3.34*  CALCIUM 8.4*   < > 8.4*  --  8.3* 8.1* 7.9* 7.7* 7.6* 7.4* 7.8*  MG 2.4  --  2.6* 2.8* 2.6* 2.7*  --   --   --   --   --   PHOS 4.2   < >  --  5.7* 5.5*  5.5* 4.4  --   --  3.2 3.5 4.6   < > = values in this interval not displayed.   GFR:  Estimated Creatinine Clearance: 28.9 mL/min (A) (by C-G formula based on SCr of 3.34 mg/dL (H)). Liver Function Tests: Recent Labs  Lab 09/22/18 0320  09/23/18 0340 09/24/18 0512 09/25/18 0517 09/26/18 0423 09/26/18 1105 09/27/18 0402  AST 72*  --  52* 41 40  --   --   --   ALT 71*  --  65* 48* 43  --   --   --   ALKPHOS 55  --  58 48 51  --   --   --   BILITOT 0.8  --  1.0 0.7 0.9  --   --   --   PROT 4.9*  --  5.1* 4.0* 3.7*  --   --   --   ALBUMIN 2.9*   < > 2.8* 2.2* 2.1* 2.2* 2.2* 2.0*   < > = values in this interval not displayed.   No results for  input(s): LIPASE, AMYLASE in the last 168 hours. No results for input(s): AMMONIA in the last 168 hours. Coagulation Profile: Recent Labs  Lab 09/25/18 1211  INR 1.1   Cardiac Enzymes: No results for input(s): CKTOTAL, CKMB, CKMBINDEX, TROPONINI in the last 168 hours. BNP (last 3 results) No results for input(s): PROBNP in the last 8760 hours. HbA1C: No results for input(s): HGBA1C in the last 72 hours. CBG: Recent Labs  Lab 09/26/18 1649 09/26/18 2026 09/26/18 2335 09/27/18 0312 09/27/18 0817  GLUCAP 107* 120* 124* 114* 168*   Lipid Profile: No results for input(s): CHOL, HDL, LDLCALC, TRIG, CHOLHDL, LDLDIRECT in the last 72 hours. Thyroid Function Tests: No results for input(s): TSH, T4TOTAL, FREET4, T3FREE, THYROIDAB in the last 72 hours. Anemia Panel: No results for input(s): VITAMINB12, FOLATE, FERRITIN, TIBC, IRON, RETICCTPCT in the last 72 hours. Sepsis Labs: No results for input(s): PROCALCITON, LATICACIDVEN in the last 168 hours.  No results found for this or any previous visit (from the past 240 hour(s)).       Radiology Studies: Ct Bone Marrow Biopsy & Aspiration  Result Date: 09/26/2018 INDICATION: 68 year old with history of bladder cancer, renal failure and thrombocytopenia. EXAM: CT GUIDED BONE MARROW ASPIRATES AND BIOPSY Physician: Stephan Minister. Anselm Pancoast, MD MEDICATIONS: None. ANESTHESIA/SEDATION:  Fentanyl 50 mcg IV; Versed 1.0 mg IV Moderate Sedation Time:  12 minutes The patient was continuously monitored during the procedure by the interventional radiology nurse under my direct supervision. COMPLICATIONS: None immediate. PROCEDURE: The procedure was explained to the patient. The risks and benefits of the procedure were discussed and the patient's questions were addressed. Informed consent was obtained from the patient. The patient was placed on the CT table with left side down. Images of the pelvis were obtained. The back was prepped and draped in sterile fashion. The skin and left posterior ilium were anesthetized with 1% lidocaine. 11 gauge bone needle was directed into the left ilium with CT guidance. Two aspirates and one core biopsy were obtained. Bandage placed over the puncture site. IMPRESSION: CT guided bone marrow aspiration and core biopsy. Electronically Signed   By: Markus Daft M.D.   On: 09/26/2018 11:44        Scheduled Meds: . chlorhexidine  15 mL Mouth Rinse BID  . Chlorhexidine Gluconate Cloth  6 each Topical Daily  . diltiazem  30 mg Per Tube Q6H  . flecainide  50 mg Oral Q12H  . furosemide  160 mg Oral TID  . gabapentin  300 mg Oral BID  . insulin aspart  0-9 Units Subcutaneous Q4H  . mouth rinse  15 mL Mouth Rinse q12n4p  . metoprolol tartrate  2.5 mg Intravenous Once  . multivitamin  1 tablet Oral QHS  . pantoprazole  40 mg Oral Daily  . sodium chloride flush  3 mL Intravenous Q12H   Continuous Infusions: . sodium chloride       LOS: 19 days     Cordelia Poche, MD [facility] 09/27/2018, 9:52 AM  If 7PM-7AM, please contact night-coverage [email or web address]

## 2018-09-27 NOTE — Progress Notes (Signed)
[name] Rose had his bone marrow biopsy yesterday.  Hopefully, there might be a report out tomorrow.  I think he is going for dialysis today.  His BUN and creatinine go up pretty quickly when he is not on dialysis.  His platelet count is holding steady at 35,000.  His HITT study came back negative.  His ADAMTS-13 also came back okay.  The level was 31.6.  This might be a little bit on the low side but certainly not low enough that would suggest TTP.  His white cell count is 8.  His hemoglobin is 8.2.  He says that he is eating.  He still has the panda tube in.  I am not sure why this is in.  Looks like speech pathology, who is doing a great job with him, is pretty confident about his swallowing abilities.  I will see about getting that tube taken out today.  He is also being evaluated for inpatient rehab.  I think this would be a good idea for him.  On the cardiac monitor, it looks like he does have some atrial fibrillation which is under good control.  His vital signs all look pretty stable.  His temperature is 97.6.  Pulse 69.  Blood pressure 127/71.  His lungs sound pretty good.  Cardiac exam regular rate and rhythm with an occasional extra beat.  Abdomen is soft.  Bowel sounds are present.  Extremities shows no clubbing, cyanosis or edema.  Neurological exam shows no focal neurological deficits.  I just hate that he needs dialysis.  However, this is going to be necessary for his kidneys so that he does not have complications from renal failure.  For right now, we will have to see how his bone marrow comes out.  At least, his platelet count is stabilizing.  His LDH has come down a little bit more.  I appreciate the wonderful care that he is getting from everybody in the stepdown unit on 2C!!!  [name], MD  Colossians 3:14

## 2018-09-28 LAB — GLUCOSE, CAPILLARY
Glucose-Capillary: 131 mg/dL — ABNORMAL HIGH (ref 70–99)
Glucose-Capillary: 164 mg/dL — ABNORMAL HIGH (ref 70–99)
Glucose-Capillary: 115 mg/dL — ABNORMAL HIGH (ref 70–99)
Glucose-Capillary: 133 mg/dL — ABNORMAL HIGH (ref 70–99)

## 2018-09-28 LAB — RENAL FUNCTION PANEL
Albumin: 2.3 g/dL — ABNORMAL LOW (ref 3.5–5.0)
Anion gap: 11 (ref 5–15)
BUN: 91 mg/dL — ABNORMAL HIGH (ref 8–23)
CO2: 28 mmol/L (ref 22–32)
Calcium: 8.2 mg/dL — ABNORMAL LOW (ref 8.9–10.3)
Chloride: 98 mmol/L (ref 98–111)
Creatinine, Ser: 3.66 mg/dL — ABNORMAL HIGH (ref 0.61–1.24)
GFR calc Af Amer: 19 mL/min — ABNORMAL LOW (ref >60)
GFR calc non Af Amer: 16 mL/min — ABNORMAL LOW (ref >60)
Glucose, Bld: 127 mg/dL — ABNORMAL HIGH (ref 70–99)
Phosphorus: 5.1 mg/dL — ABNORMAL HIGH (ref 2.5–4.6)
Potassium: 3.8 mmol/L (ref 3.5–5.1)
Sodium: 137 mmol/L (ref 135–145)

## 2018-09-28 LAB — CBC
HCT: 28.2 % — ABNORMAL LOW (ref 39.0–52.0)
Hemoglobin: 9.1 g/dL — ABNORMAL LOW (ref 13.0–17.0)
MCH: 32.7 pg (ref 26.0–34.0)
MCHC: 32.3 g/dL (ref 30.0–36.0)
MCV: 101.4 fL — ABNORMAL HIGH (ref 80.0–100.0)
Platelets: 33 10*3/uL — ABNORMAL LOW (ref 150–400)
RBC: 2.78 MIL/uL — ABNORMAL LOW (ref 4.22–5.81)
RDW: 21.4 % — ABNORMAL HIGH (ref 11.5–15.5)
WBC: 8.8 10*3/uL (ref 4.0–10.5)
nRBC: 0 % (ref 0.0–0.2)

## 2018-09-28 LAB — LACTATE DEHYDROGENASE: LDH: 437 U/L — ABNORMAL HIGH (ref 98–192)

## 2018-09-28 MED ORDER — SENNOSIDES-DOCUSATE SODIUM 8.6-50 MG PO TABS
2.0000 | ORAL_TABLET | Freq: Once | ORAL | Status: AC
  Administered 2018-09-28: 2 via ORAL
  Filled 2018-09-28: qty 2

## 2018-09-28 MED ORDER — SENNOSIDES-DOCUSATE SODIUM 8.6-50 MG PO TABS
2.0000 | ORAL_TABLET | Freq: Every evening | ORAL | Status: DC | PRN
  Administered 2018-10-13: 2 via ORAL
  Filled 2018-09-28: qty 2

## 2018-09-28 NOTE — Progress Notes (Signed)
PROGRESS NOTE    [name] Rose  DZH:[id number] DOB: December 03, 1950 DOA: 09/08/2018 PCP: Shirline Frees, MD   Brief Narrative: [name] Rose is a 68 y.o. male who presented with respiratory failure and fever secondary to presumed pneumonia. He was started empirically with antibiotics. COVID-19 negative. He worsened requiring mechanical ventilation and CRRT for acute renal failure. He was successfully transfused with plans to start intermittent HD for continued renal failure. Course has also been complicated by anemia and atrial fibrillation with RVR.   Assessment & Plan:   Principal Problem:   Pneumonia Active Problems:   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 (HCC)   LFT elevation   AKI (acute kidney injury) ([place])   CKD (chronic kidney disease), stage III (HCC)   Acute respiratory failure with hypoxia (HCC)   Acute renal failure (HCC)   Acute renal failure with acute tubular necrosis superimposed on stage 3 chronic kidney disease ([place])   Metastatic cancer ([place])   Palliative care encounter   Pneumonia Treated empirically with antibiotics. COVID-19 negative x2. ID consulted and recommending discontinuation of antibiotics on 6/8.  Acute respiratory failure with hypoxia Unsure of etiology, but likely pneumonia. Required mechanical ventilation from 6/2 to 6/10. Steroids burst completed. Acute worsening secondary to below which has resolved. -Keep O2 saturations >92% -Wean to room air as able  Atrial fibrillation w/ RVR Paroxysmal atrial fibrillation on metoprolol and diltiazem as an outpatient. Recurrent episodes of RVR. Currently rate controlled. -Continue diltiazem -Cardiology consult/recommendations: started Flecainide   Hypotension Acutely worsened since giving metoprolol in addition to anemia. Resolved. -Blood transfusion as mentioned below  Acute kidney injury on CKD stage 3 Baseline creatinine of 1.7-1.8. Creatinine of 2.3 on admission in  setting of hypotension. Progressed to requiring CRRT while in the ICU. Nephrology on board. Now transitioning to IHD. Patient with 1.15L UOP over the last 24 hours -Nephrology recommendations: HD decision daily; lasix  Acute on chronic anemia Baseline hemoglobin of 9-10. Down to 7.8 today. Unknown etiology. No bleeding noted. Hematology on board with concern for possible renal etiology vs possible BM etiology. S/p 4 units of PRBC to date. -CBC daily  Chronic thrombocytopenia Stable. -Hematology recommendations: BM biopsy results pending  Essential hypertension Hypotension improved with resolution of RVR and transfusion. On diltiazem, metoprolol, losartan as an outpatient. -Continue diltiazem  Metastatic bladder cancer Currently on chemotherapy treatment. Follows with [name]. Last chemotherapy treatment was 5/18 per patient.  Dysphagia Severe aspiration risk per SLP evaluation on 6/12. Currently on tube feeds but advanced to a dysphagia 3 diet on 6/15. NG tube removed on 6/17 -Continue Dysphagia 3 diet -SLP ongoing recommendations   DVT prophylaxis: SCDs Code Status:   Code Status: Full Code Family Communication: None Disposition Plan: Discharge pending further clinical improvement and pending renal function improvement vs transition to ESRD   Consultants:   PCCM  Nephrology  Cardiology  Palliative care  Procedures:   CRRT  Antimicrobials:  Vancomycin  Cefepime  Bactrim    Subjective: No issues overnight. No chest pain, dyspnea, palpitations.  Objective: Vitals:   09/27/18 2337 09/28/18 0344 09/28/18 0600 09/28/18 0817  BP: (!) 137/96 119/75 121/69 113/67  Pulse: 86 77  92  Resp: (!) 23 17  (!) 24  Temp: 97.9 F (36.6 C) (!) 97.5 F (36.4 C)  (!) 97.5 F (36.4 C)  TempSrc: Oral Axillary  Axillary  SpO2: 97% 95%  98%  Weight:  113.5 kg    Height:  Intake/Output Summary (Last 24 hours) at 09/28/2018 0906 Last data filed at 09/28/2018  0600 Gross per 24 hour  Intake 43.93 ml  Output 3475 ml  Net -3431.07 ml   Filed Weights   09/26/18 0411 09/27/18 0610 09/28/18 0344  Weight: 112.8 kg 114.8 kg 113.5 kg    Examination:  General exam: Appears calm and comfortable Respiratory system: Rales bilaterally Respiratory effort normal. Cardiovascular system: S1 & S2 heard, RRR. No murmurs, rubs, gallops or clicks. Gastrointestinal system: Abdomen is nondistended, soft and nontender. No organomegaly or masses felt. Normal bowel sounds heard. Central nervous system: Alert and oriented. No focal neurological deficits. Extremities: No edema. No calf tenderness Skin: No cyanosis. No rashes Psychiatry: Judgement and insight appear normal. Mood & affect appropriate.     Data Reviewed: I have personally reviewed following labs and imaging studies  CBC: Recent Labs  Lab 09/22/18 0320 09/23/18 0340 09/24/18 0512 09/24/18 1633 09/25/18 0517 09/26/18 0423 09/27/18 0402 09/28/18 0500  WBC 12.8* 16.7* 13.1* 21.0* 11.6* 10.1 8.0 8.8  NEUTROABS 10.9* 15.0* 11.4*  --  9.9*  --   --   --   HGB 7.8* 7.9* 6.6* 8.5* 8.3* 8.7* 8.2* 9.1*  HCT 25.1* 25.1* 21.4* 26.5* 26.1* 27.2* 26.2* 28.2*  MCV 100.8* 104.1* 107.0* 101.5* 103.2* 103.8* 104.4* 101.4*  PLT 50* 43* 36* 34* 31* 34* 35* 33*   Basic Metabolic Panel: Recent Labs  Lab 09/22/18 0320 09/22/18 1102 09/22/18 1617 09/23/18 0340  09/25/18 0517 09/26/18 0423 09/26/18 1105 09/27/18 0402 09/28/18 0500  NA 137  --  136 137   < > 139 138 131* 138 137  K 4.5  --  4.2 3.9   < > 4.1 3.6 3.3* 3.5 3.8  CL 102  --  101 103   < > 102 101 97* 103 98  CO2 27  --  25 26   < > _0 GLUCOSE 138*  --  157* 183*   < > 195* 117* 102* 103* 127*  BUN 45*  --  40* 42*   < > 105* 55* 55* 77* 91*  CREATININE 1.55*  --  1.77* 1.60*   < > 4.04* 2.34* 2.41* 3.34* 3.66*  CALCIUM 8.4*  --  8.3* 8.1*   < > 7.7* 7.6* 7.4* 7.8* 8.2*  MG 2.6* 2.8* 2.6* 2.7*  --   --   --   --   --   --    PHOS  --  5.7* 5.5*  5.5* 4.4  --   --  3.2 3.5 4.6 5.1*   < > = values in this interval not displayed.   GFR: Estimated Creatinine Clearance: 26.2 mL/min (A) (by C-G formula based on SCr of 3.66 mg/dL (H)). Liver Function Tests: Recent Labs  Lab 09/22/18 0320  09/23/18 0340 09/24/18 0512 09/25/18 0517 09/26/18 0423 09/26/18 1105 09/27/18 0402 09/28/18 0500  AST 72*  --  52* 41 40  --   --   --   --   ALT 71*  --  65* 48* 43  --   --   --   --   ALKPHOS 55  --  58 48 51  --   --   --   --   BILITOT 0.8  --  1.0 0.7 0.9  --   --   --   --   PROT 4.9*  --  5.1* 4.0* 3.7*  --   --   --   --  ALBUMIN 2.9*   < > 2.8* 2.2* 2.1* 2.2* 2.2* 2.0* 2.3*   < > = values in this interval not displayed.   No results for input(s): LIPASE, AMYLASE in the last 168 hours. No results for input(s): AMMONIA in the last 168 hours. Coagulation Profile: Recent Labs  Lab 09/25/18 1211  INR 1.1   Cardiac Enzymes: No results for input(s): CKTOTAL, CKMB, CKMBINDEX, TROPONINI in the last 168 hours. BNP (last 3 results) No results for input(s): PROBNP in the last 8760 hours. HbA1C: No results for input(s): HGBA1C in the last 72 hours. CBG: Recent Labs  Lab 09/27/18 0817 09/27/18 1114 09/27/18 1628 09/27/18 2122 09/28/18 0600  GLUCAP 168* 133* 130* 143* 131*   Lipid Profile: No results for input(s): CHOL, HDL, LDLCALC, TRIG, CHOLHDL, LDLDIRECT in the last 72 hours. Thyroid Function Tests: No results for input(s): TSH, T4TOTAL, FREET4, T3FREE, THYROIDAB in the last 72 hours. Anemia Panel: No results for input(s): VITAMINB12, FOLATE, FERRITIN, TIBC, IRON, RETICCTPCT in the last 72 hours. Sepsis Labs: No results for input(s): PROCALCITON, LATICACIDVEN in the last 168 hours.  No results found for this or any previous visit (from the past 240 hour(s)).       Radiology Studies: Ct Bone Marrow Biopsy & Aspiration  Result Date: 09/26/2018 INDICATION: 68 year old with history of bladder  cancer, renal failure and thrombocytopenia. EXAM: CT GUIDED BONE MARROW ASPIRATES AND BIOPSY Physician: Stephan Minister. Anselm Pancoast, MD MEDICATIONS: None. ANESTHESIA/SEDATION: Fentanyl 50 mcg IV; Versed 1.0 mg IV Moderate Sedation Time:  12 minutes The patient was continuously monitored during the procedure by the interventional radiology nurse under my direct supervision. COMPLICATIONS: None immediate. PROCEDURE: The procedure was explained to the patient. The risks and benefits of the procedure were discussed and the patient's questions were addressed. Informed consent was obtained from the patient. The patient was placed on the CT table with left side down. Images of the pelvis were obtained. The back was prepped and draped in sterile fashion. The skin and left posterior ilium were anesthetized with 1% lidocaine. 11 gauge bone needle was directed into the left ilium with CT guidance. Two aspirates and one core biopsy were obtained. Bandage placed over the puncture site. IMPRESSION: CT guided bone marrow aspiration and core biopsy. Electronically Signed   By: Markus Daft M.D.   On: 09/26/2018 11:44        Scheduled Meds: . chlorhexidine  15 mL Mouth Rinse BID  . Chlorhexidine Gluconate Cloth  6 each Topical Daily  . diltiazem  30 mg Per Tube Q6H  . feeding supplement (ENSURE ENLIVE)  237 mL Oral BID BM  . feeding supplement (PRO-STAT SUGAR FREE 64)  30 mL Oral Daily  . flecainide  50 mg Oral Q12H  . furosemide  160 mg Oral TID  . gabapentin  300 mg Oral BID  . insulin aspart  0-9 Units Subcutaneous TID WC  . mouth rinse  15 mL Mouth Rinse q12n4p  . metoprolol tartrate  2.5 mg Intravenous Once  . multivitamin  1 tablet Oral QHS  . pantoprazole  40 mg Oral Daily  . sodium chloride flush  3 mL Intravenous Q12H   Continuous Infusions: . sodium chloride Stopped (09/28/18 0600)     LOS: 20 days     Cordelia Poche, MD [facility] 09/28/2018, 9:06 AM  If 7PM-7AM, please contact night-coverage  [email or web address]

## 2018-09-28 NOTE — Progress Notes (Signed)
Subjective: Interval History: has complaints weak, wants to get PT.  Objective: Vital signs in last 24 hours: Temp:  [96.8 F (36 C)-98.4 F (36.9 C)] 97.5 F (36.4 C) (06/18 0344) Pulse Rate:  [77-120] 77 (06/18 0344) Resp:  [17-33] 17 (06/18 0344) BP: (119-137)/(69-96) 121/69 (06/18 0600) SpO2:  [88 %-97 %] 95 % (06/18 0344) Weight:  [113.5 kg] 113.5 kg (06/18 0344) Weight change: -1.3 kg  Intake/Output from previous day: 06/17 0701 - 06/18 0700 In: 43.9 [I.V.:43.9] Out: 2325 [Urine:2325] Intake/Output this shift: No intake/output data recorded.  General appearance: alert, cooperative, no distress, morbidly obese and pale Neck: IJ temp cath Resp: diminished breath sounds bilaterally Cardio: regular rate and rhythm and systolic murmur: systolic ejection 2/6, crescendo and decrescendo at 2nd left intercostal space GI: obese, pos bs, soft Extremities: edema 2-3+  Lab Results: Recent Labs    09/27/18 0402 09/28/18 0500  WBC 8.0 8.8  HGB 8.2* 9.1*  HCT 26.2* 28.2*  PLT 35* 33*   BMET:  Recent Labs    09/27/18 0402 09/28/18 0500  NA 138 137  K 3.5 3.8  CL 103 98  CO2 25 28  GLUCOSE 103* 127*  BUN 77* 91*  CREATININE 3.34* 3.66*  CALCIUM 7.8* 8.2*   Recent Labs    09/25/18 1210  PTH 96*   Iron Studies: No results for input(s): IRON, TIBC, TRANSFERRIN, FERRITIN in the last 72 hours.  Studies/Results: Ct Bone Marrow Biopsy & Aspiration  Result Date: 09/26/2018 INDICATION: 68 year old with history of bladder cancer, renal failure and thrombocytopenia. EXAM: CT GUIDED BONE MARROW ASPIRATES AND BIOPSY Physician: Stephan Minister. Anselm Pancoast, MD MEDICATIONS: None. ANESTHESIA/SEDATION: Fentanyl 50 mcg IV; Versed 1.0 mg IV Moderate Sedation Time:  12 minutes The patient was continuously monitored during the procedure by the interventional radiology nurse under my direct supervision. COMPLICATIONS: None immediate. PROCEDURE: The procedure was explained to the patient. The risks and  benefits of the procedure were discussed and the patient's questions were addressed. Informed consent was obtained from the patient. The patient was placed on the CT table with left side down. Images of the pelvis were obtained. The back was prepped and draped in sterile fashion. The skin and left posterior ilium were anesthetized with 1% lidocaine. 11 gauge bone needle was directed into the left ilium with CT guidance. Two aspirates and one core biopsy were obtained. Bandage placed over the puncture site. IMPRESSION: CT guided bone marrow aspiration and core biopsy. Electronically Signed   By: Markus Daft M.D.   On: 09/26/2018 11:44    I have reviewed the patient's current medications.  Assessment/Plan: 1 AKI Cr rise slow, so has some function and is responding to lasix for vol xs. No indic for Hd 2 Anemia stable 3 low ptlt per Onc 4 Bladder Ca 5 sepsis resolved 6 DM 7 Debill P lasix, follow chem, mobilize, follow ptlt    LOS: 20 days   Jeneen Rinks Jandel Patriarca 09/28/2018,7:45 AM

## 2018-09-28 NOTE — Progress Notes (Signed)
Progress Note  Patient Name: [name] Rose Date of Encounter: 09/28/2018  Primary Cardiologist: Pixie Casino, MD   Subjective   Breathing is OK  Denies CP   Inpatient Medications    Scheduled Meds:  chlorhexidine  15 mL Mouth Rinse BID   Chlorhexidine Gluconate Cloth  6 each Topical Daily   diltiazem  30 mg Per Tube Q6H   feeding supplement (ENSURE ENLIVE)  237 mL Oral BID BM   feeding supplement (PRO-STAT SUGAR FREE 64)  30 mL Oral Daily   flecainide  50 mg Oral Q12H   furosemide  160 mg Oral TID   gabapentin  300 mg Oral BID   insulin aspart  0-9 Units Subcutaneous TID WC   mouth rinse  15 mL Mouth Rinse q12n4p   metoprolol tartrate  2.5 mg Intravenous Once   multivitamin  1 tablet Oral QHS   pantoprazole  40 mg Oral Daily   sodium chloride flush  3 mL Intravenous Q12H   Continuous Infusions:  sodium chloride 250 mL (09/27/18 2148)   PRN Meds: sodium chloride, acetaminophen **OR** acetaminophen, albuterol, bisacodyl, labetalol, metoprolol tartrate, [DISCONTINUED] ondansetron **OR** ondansetron (ZOFRAN) IV, sennosides, sodium chloride flush, sodium chloride flush, zolpidem   Vital Signs    Vitals:   09/27/18 2000 09/27/18 2337 09/28/18 0344 09/28/18 0600  BP: 131/73 (!) 137/96 119/75 121/69  Pulse: 86 86 77   Resp: (!) 23 (!) 23 17   Temp: 98.4 F (36.9 C) 97.9 F (36.6 C) (!) 97.5 F (36.4 C)   TempSrc: Oral Oral Axillary   SpO2: 95% 97% 95%   Weight:   113.5 kg   Height:        Intake/Output Summary (Last 24 hours) at 09/28/2018 0709 Last data filed at 09/28/2018 0600 Gross per 24 hour  Intake 43.93 ml  Output 2325 ml  Net -2281.07 ml   Net neg 16 L    Last 3 Weights 09/28/2018 09/27/2018 09/26/2018  Weight (lbs) 250 lb 3.6 oz 253 lb 1.4 oz 248 lb 10.9 oz  Weight (kg) 113.5 kg 114.8 kg 112.8 kg      Telemetry    Afib   80s   Personally Reviewed  ECG    Afib 97 bpm   QRS 107 msec - Personally Reviewed  Physical Exam    GEN: No acute distress.   Neck: Difficult to assess JVP due to lines  Cardiac: Irreg irreg , no murmurs, rubs, or gallops.  Respiratory: bilateral rhonchi   GI: Soft, nontender, non-distended  MS:   1+ UE edema 1+ LE edema; No deformity. Neuro:  Nonfocal  Psych: Normal affect   Labs    Chemistry Recent Labs  Lab 09/23/18 0340 09/24/18 0512 09/25/18 0517  09/26/18 1105 09/27/18 0402 09/28/18 0500  NA 137 136 139   < > 131* 138 137  K 3.9 3.8 4.1   < > 3.3* 3.5 3.8  CL 103 103 102   < > 97* 103 98  CO2 _0 < > _1 GLUCOSE 183* 198* 195*   < > 102* 103* 127*  BUN 42* 77* 105*   < > 55* 77* 91*  CREATININE 1.60* 3.15* 4.04*   < > 2.41* 3.34* 3.66*  CALCIUM 8.1* 7.9* 7.7*   < > 7.4* 7.8* 8.2*  PROT 5.1* 4.0* 3.7*  --   --   --   --   ALBUMIN 2.8* 2.2* 2.1*   < >  2.2* 2.0* 2.3*  AST 52* 41 40  --   --   --   --   ALT 65* 48* 43  --   --   --   --   ALKPHOS 58 48 51  --   --   --   --   BILITOT 1.0 0.7 0.9  --   --   --   --   GFRNONAA 44* 19* 14*   < > 27* 18* 16*  GFRAA 51* 22* 17*   < > 31* 21* 19*  ANIONGAP _0 < > _1 < > = values in this interval not displayed.     Hematology Recent Labs  Lab 09/26/18 0423 09/27/18 0402 09/28/18 0500  WBC 10.1 8.0 8.8  RBC 2.62* 2.51* 2.78*  HGB 8.7* 8.2* 9.1*  HCT 27.2* 26.2* 28.2*  MCV 103.8* 104.4* 101.4*  MCH 33.2 32.7 32.7  MCHC 32.0 31.3 32.3  RDW 22.8* 22.6* 21.4*  PLT 34* 35* 33*    Cardiac EnzymesNo results for input(s): TROPONINI in the last 168 hours. No results for input(s): TROPIPOC in the last 168 hours.   BNPNo results for input(s): BNP, PROBNP in the last 168 hours.   DDimer No results for input(s): DDIMER in the last 168 hours.   Radiology    Ct Bone Marrow Biopsy & Aspiration  Result Date: 09/26/2018 INDICATION: 68 year old with history of bladder cancer, renal failure and thrombocytopenia. EXAM: CT GUIDED BONE MARROW ASPIRATES AND BIOPSY Physician: Stephan Minister. Anselm Pancoast, MD  MEDICATIONS: None. ANESTHESIA/SEDATION: Fentanyl 50 mcg IV; Versed 1.0 mg IV Moderate Sedation Time:  12 minutes The patient was continuously monitored during the procedure by the interventional radiology nurse under my direct supervision. COMPLICATIONS: None immediate. PROCEDURE: The procedure was explained to the patient. The risks and benefits of the procedure were discussed and the patient's questions were addressed. Informed consent was obtained from the patient. The patient was placed on the CT table with left side down. Images of the pelvis were obtained. The back was prepped and draped in sterile fashion. The skin and left posterior ilium were anesthetized with 1% lidocaine. 11 gauge bone needle was directed into the left ilium with CT guidance. Two aspirates and one core biopsy were obtained. Bandage placed over the puncture site. IMPRESSION: CT guided bone marrow aspiration and core biopsy. Electronically Signed   By: Markus Daft M.D.   On: 09/26/2018 11:44    Cardiac Studies  Relevant CV Studies: Echo 06/22/18 IMPRESSIONS   1. The left ventricle has normal systolic function, with an ejection fraction of 55-60%. The cavity size was normal. Left ventricular diastolic Doppler parameters are consistent with impaired relaxation. 2. The right ventricle has normal systolic function. The cavity was mildly enlarged. There is no increase in right ventricular wall thickness. 3. Right atrial size was mildly dilated. 4. Moderate thickening of the aortic valve Mild calcification of the aortic valve. 5. There is dilatation of the aortic root measuring 39 mm. 6. No vegetations detected.  FINDINGS Left Ventricle: The left ventricle has normal systolic function, with an ejection fraction of 55-60%. The cavity size was normal. There is no increase in left ventricular wall thickness. Left ventricular diastolic Doppler parameters are consistent with  impaired relaxation. Definity contrast agent was  given IV to delineate the left ventricular endocardial borders. Right Ventricle: The right ventricle has normal systolic function. The cavity was mildly enlarged. There is no increase in right  ventricular wall thickness. Left Atrium: left atrial size was normal in size Right Atrium: right atrial size was mildly dilated. Right atrial pressure is estimated at 3 mmHg. Interatrial Septum: No atrial level shunt detected by color flow Doppler. Pericardium: There is no evidence of pericardial effusion. Mitral Valve: The mitral valve is normal in structure. Mitral valve regurgitation was not assessed by color flow Doppler. Tricuspid Valve: The tricuspid valve is normal in structure. Tricuspid valve regurgitation was not visualized by color flow Doppler. Aortic Valve: The aortic valve is normal in structure. Moderate thickening of the aortic valve Mild calcification of the aortic valve. Aortic valve regurgitation was not visualized by color flow Doppler. Pulmonic Valve: The pulmonic valve was normal in structure. Pulmonic valve regurgitation is not visualized by color flow Doppler. Aorta: There is dilatation of the aortic root measuring 39 mm. Venous: The inferior vena cava is normal in size with greater than 50% respiratory variability.  TEE 06/27/18 IMPRESSIONS   1. The left ventricle has normal systolic function, with an ejection fraction of 55-60%. 2. Moderate mitral regurgitation. 3. No valvular vegetations seen. 4. 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  FINDINGS Left Ventricle: The left ventricle has normal systolic function, with an ejection fraction of 55-60%. Right Ventricle: The right ventricle has normal systolic function. Left Atrium: Left atrial size was normal in size. Right Atrium: Right atrial size was normal in size. Interatrial Septum: No atrial level shunt detected by color flow Doppler. Mitral Valve: The mitral valve is grossly normal. Mitral valve  regurgitation is moderate by color flow Doppler. There is no evidence of mitral valve vegetation. Tricuspid Valve: The tricuspid valve was grossly normal. Tricuspid valve regurgitation is trivial by color flow Doppler. No TV vegetation was visualized. Aortic Valve: The aortic valve is tricuspid Mild thickening of the aortic valve. There is no stenosis of the aortic valve. There is no evidence of a vegetation on the aortic valve. Pulmonic Valve: The pulmonic valve was grossly normal. Pulmonic valve regurgitation is not visualized by color flow Doppler. Aorta: The ascending aorta is normal in size and structure.  LV Wall Scoring:  MR Peak grad: 109.4 mmHg MR Mean grad: 74.0 mmHg MR Vmax: 523.00 cm/s MR Vmean: 398.0 cm/s   Manish Patwardhan MD Electronically signed by Vernell Leep MD Signature Date/Time: 06/27/2018/5:27:15 PM  Patient Profile     CAMARI WISHAM is a 68 y.o. male with a hx of metastatic bladder cancer and hx of sepsis and MSSA bacteremia, PAF with hospitalization but could not be anticoagulated due anemia and thrombocytopenia (chemo) and rate control alone was planned, not rhythm control, mod MR, on TEE and no endocarditis that admit who is being seen today for the evaluation of atria fib RVR at the request of Dr. Lonny Prude  Assessment & Plan    1  PAF Pt remains in Afib   Rates OK   80s    WOuld stop flecanide  Not helping    The pt's  CHADS2VASc is 3   No anticoagulation due to low plts  3  Thrombocytopenia     Plt 33 K  Today   4 Anemia  Hgb 9.1  5   CKD Renal following  Cr 3.66  Some urine output  [place] on HD    7   CAD   Atherosclerosis  found on CT  Pt with no symptoms of  angina    For questions or updates, please contact [facility] Please consult [email or web address] for  contact info under        Signed, Dorris Carnes, MD  09/28/2018, 7:09 AM

## 2018-09-28 NOTE — Progress Notes (Signed)
Talked with patient at bedside.    Discussed symptoms.  He complains of some constipation.  Will Rx senna tonight.    He complains primarily of weakness and wants to get up out of bed.  Was only able to sit on EOB this am with PT.  He would like more activity.  Motivated to get stronger.  Discussed increased urine output.  Hopefully he will not need long term hemodialysis.  Talked at a very high level about BM biopsy results, but deferred to [name].  Patient wants to be full code / full scope as long as he has "Quality of Life" which he explains as being able to function - not being completely bed bound and dependent.  Talked briefly about SNF rehab vs PT at home vs CIR.   CIR would be a great option for him if that works out.  Recommendations:  PMT will continue to chart check and monitor for need to re-engage.  Please contact us directly if we are needed.  Full code/full scope.  Quality of life important.  Living Will in [place] from 2016.  Florentina Jenny, PA-C Palliative Medicine Pager: [phone]  25 min. Greater than 50%  of this time was spent counseling and coordinating care related to the above assessment and plan

## 2018-09-28 NOTE — Progress Notes (Signed)
Physical Therapy Treatment Patient Details Name: [name] Rose MRN: [id number] DOB: [birth year] Today's Date: 09/28/2018    History of Present Illness Pt is a 68 y/o male presenting with resiratory failure and fever secondary to presumed pneumonia.  Intubated 6/05/18/08, CRRT while in ICU.  Course complicated by anemia and atrial fib with RVR. PMH: prostate cancer with bladder mets, chemo induced mucositits, CKD, HTN, hyperglycemia.     PT Comments    Pt admitted with above diagnosis. Pt currently with functional limitations due to balance and endurance deficits. Pt was able to sit EOB with mod to min assist for 9 minutes.  Attempts to stand to [facility] were unsuccessful and HR up to 145 bpm with sats dropping to 85% with O2 up to 6L.  Laid pt back down and HR improved and sats to 92% on 3L.  Will continue to progress pt as tolerated.   Pt will benefit from skilled PT to increase their independence and safety with mobility to allow discharge to the venue listed below.     Follow Up Recommendations  CIR     Equipment Recommendations  Other (comment)(TBD)    Recommendations for Other Services Rehab consult     Precautions / Restrictions Precautions Precautions: Fall Restrictions Weight Bearing Restrictions: No    Mobility  Bed Mobility Overal bed mobility: Needs Assistance Bed Mobility: Supine to Sit;Sit to Supine     Supine to sit: HOB elevated;Mod assist Sit to supine: Total assist;+2 for physical assistance   General bed mobility comments: overall mod assist to transition to EOB for LB and trunk support, returned to supine with total assist; cueing for sequencing and technique   Transfers Overall transfer level: Needs assistance   Transfers: Sit to/from Stand Sit to Stand: Max assist;+2 physical assistance;From elevated surface         General transfer comment: Attempted to stand to [facility] x 2 however even with pad assist, pt only able to clear buttocks off bed and could  not stand long enough or upright enough to move the Stedy buttocks pads underneath pt.  Pt with flexed trunk and knees throughout.  Also desat to 85% and HR to 145 bpm with DOE 3/4.  Pt did not improve with HR or sats until PT laid pt back down.  Also did incr O2 with activity from 3L-6L to keep sats up.    Ambulation/Gait             General Gait Details: unable   Stairs             Wheelchair Mobility    Modified Rankin (Stroke Patients Only)       Balance Overall balance assessment: Needs assistance Sitting-balance support: Bilateral upper extremity supported;Feet supported Sitting balance-Leahy Scale: Poor Sitting balance - Comments: min-max assist for sitting EOB with posterior lean  Postural control: Posterior lean Standing balance support: Bilateral upper extremity supported;During functional activity Standing balance-Leahy Scale: Poor Standing balance comment: relies heavily on UE support and could not stand fully upright with max to total assist with pad                            Cognition Arousal/Alertness: Awake/alert Behavior During Therapy: WFL for tasks assessed/performed Overall Cognitive Status: Impaired/Different from baseline Area of Impairment: Problem solving  Problem Solving: Slow processing;Decreased initiation;Requires verbal cues;Difficulty sequencing        Exercises General Exercises - Lower Extremity Ankle Circles/Pumps: AROM;Both;10 reps;Supine Quad Sets: AROM;Both;10 reps;Supine Long Arc Quad: AROM;Both;10 reps;Seated Heel Slides: AAROM;Both;10 reps;Supine    General Comments        Pertinent Vitals/Pain Pain Assessment: Faces Faces Pain Scale: Hurts even more Pain Location: right knee Pain Descriptors / Indicators: Aching;Grimacing;Guarding Pain Intervention(s): Limited activity within patient's tolerance;Monitored during session;Repositioned    Home Living                       Prior Function            PT Goals (current goals can now be found in the care plan section) Acute Rehab PT Goals Patient Stated Goal: to get stronger Progress towards PT goals: Progressing toward goals    Frequency    Min 3X/week      PT Plan Current plan remains appropriate    Co-evaluation              AM-PAC PT "6 Clicks" Mobility   Outcome Measure  Help needed turning from your back to your side while in a flat bed without using bedrails?: A Lot Help needed moving from lying on your back to sitting on the side of a flat bed without using bedrails?: A Lot Help needed moving to and from a bed to a chair (including a wheelchair)?: Total Help needed standing up from a chair using your arms (e.g., wheelchair or bedside chair)?: A Lot Help needed to walk in hospital room?: Total Help needed climbing 3-5 steps with a railing? : Total 6 Click Score: 9    End of Session Equipment Utilized During Treatment: Oxygen;Gait belt Activity Tolerance: Patient limited by fatigue(limited by incr HR) Patient left: in bed;with call bell/phone within reach;with bed alarm set Nurse Communication: Mobility status;Need for lift equipment(nurse asked to MAxi move OOB later today) PT Visit Diagnosis: Unsteadiness on feet (R26.81);Other abnormalities of gait and mobility (R26.89);Muscle weakness (generalized) (M62.81)     Time: 7062-3762 PT Time Calculation (min) (ACUTE ONLY): 19 min  Charges:  $Therapeutic Activity: 8-22 mins                     [place] Pager:  [phone]  Office:  [place] 09/28/2018, 10:19 AM

## 2018-09-28 NOTE — TOC Progression Note (Addendum)
Transition of Care [facility]) - Progression Note    Patient Details  Name: [name] Rose MRN: [id number] Date of Birth: [birth year]  Transition of [facility]) CM/SW Contact  Maryclare Labrador, RN Phone Number: 09/28/2018, 8:56 AM  Clinical Narrative:   Pt now with some urine output, on IV lasix.  Kidneys are still the barrier for CIR admission as facility will not admit until determination has been made for the need of perm HD/completed clipping if HD is necessary  NG tube removed yesterday and pt tolerating diet per notes.  CM will continue to follow    Expected Discharge Plan: [place] Barriers to Discharge: Continued Medical Work up  Expected Discharge Plan and Services Expected Discharge Plan: [place] arrangements for the past 2 months: Single Family Home Expected Discharge Date: (unknown)                                     Social Determinants of Health (SDOH) Interventions    Readmission Risk Interventions Readmission Risk Prevention Plan 09/08/2018 06/28/2018  Transportation Screening Complete Complete  PCP or Specialist Appt within 3-5 Days - Complete  HRI or [place] - Complete  Social Work Consult for [place] Planning/Counseling - Complete  Palliative Care Screening - Not Applicable  Medication Review Press photographer) Complete Complete  PCP or Specialist appointment within 3-5 days of discharge Not Complete -  PCP/Specialist Appt Not Complete comments not yet ready for d/c -  HRI or [place] Not Complete -  HRI or Home Care Consult Pt Refusal Comments no needs identified at this time -  SW Recovery Care/Counseling Consult Not Complete -  SW Consult Not Complete Comments no needs identified at this time -  Palliative Care Screening Not Applicable -  [place] Not Applicable -  Some recent data might be hidden

## 2018-09-29 DIAGNOSIS — D6949 Other primary thrombocytopenia: Secondary | ICD-10-CM

## 2018-09-29 DIAGNOSIS — I482 Chronic atrial fibrillation, unspecified: Secondary | ICD-10-CM

## 2018-09-29 LAB — RENAL FUNCTION PANEL
Albumin: 2.4 g/dL — ABNORMAL LOW (ref 3.5–5.0)
Anion gap: 14 (ref 5–15)
BUN: 100 mg/dL — ABNORMAL HIGH (ref 8–23)
CO2: 30 mmol/L (ref 22–32)
Calcium: 8.3 mg/dL — ABNORMAL LOW (ref 8.9–10.3)
Chloride: 93 mmol/L — ABNORMAL LOW (ref 98–111)
Creatinine, Ser: 4.08 mg/dL — ABNORMAL HIGH (ref 0.61–1.24)
GFR calc Af Amer: 16 mL/min — ABNORMAL LOW (ref >60)
GFR calc non Af Amer: 14 mL/min — ABNORMAL LOW (ref >60)
Glucose, Bld: 136 mg/dL — ABNORMAL HIGH (ref 70–99)
Phosphorus: 5.9 mg/dL — ABNORMAL HIGH (ref 2.5–4.6)
Potassium: 4 mmol/L (ref 3.5–5.1)
Sodium: 137 mmol/L (ref 135–145)

## 2018-09-29 LAB — HEPATIC FUNCTION PANEL
ALT: 43 U/L (ref 0–44)
AST: 42 U/L — ABNORMAL HIGH (ref 15–41)
Albumin: 2.4 g/dL — ABNORMAL LOW (ref 3.5–5.0)
Alkaline Phosphatase: 59 U/L (ref 38–126)
Bilirubin, Direct: 0.2 mg/dL (ref 0.0–0.2)
Indirect Bilirubin: 0.6 mg/dL (ref 0.3–0.9)
Total Bilirubin: 0.8 mg/dL (ref 0.3–1.2)
Total Protein: 4.8 g/dL — ABNORMAL LOW (ref 6.5–8.1)

## 2018-09-29 LAB — GLUCOSE, CAPILLARY
Glucose-Capillary: 123 mg/dL — ABNORMAL HIGH (ref 70–99)
Glucose-Capillary: 140 mg/dL — ABNORMAL HIGH (ref 70–99)
Glucose-Capillary: 167 mg/dL — ABNORMAL HIGH (ref 70–99)
Glucose-Capillary: 121 mg/dL — ABNORMAL HIGH (ref 70–99)

## 2018-09-29 LAB — LACTATE DEHYDROGENASE: LDH: 477 U/L — ABNORMAL HIGH (ref 98–192)

## 2018-09-29 LAB — CBC
HCT: 28.9 % — ABNORMAL LOW (ref 39.0–52.0)
Hemoglobin: 9.5 g/dL — ABNORMAL LOW (ref 13.0–17.0)
MCH: 32.8 pg (ref 26.0–34.0)
MCHC: 32.9 g/dL (ref 30.0–36.0)
MCV: 99.7 fL (ref 80.0–100.0)
Platelets: 39 10*3/uL — ABNORMAL LOW (ref 150–400)
RBC: 2.9 MIL/uL — ABNORMAL LOW (ref 4.22–5.81)
RDW: 20.8 % — ABNORMAL HIGH (ref 11.5–15.5)
WBC: 9.5 10*3/uL (ref 4.0–10.5)
nRBC: 0 % (ref 0.0–0.2)

## 2018-09-29 MED ORDER — SEVELAMER CARBONATE 800 MG PO TABS
800.0000 mg | ORAL_TABLET | Freq: Three times a day (TID) | ORAL | Status: DC
  Administered 2018-09-29 – 2018-10-04 (×13): 800 mg via ORAL
  Filled 2018-09-29 (×13): qty 1

## 2018-09-29 MED ORDER — ROMIPLOSTIM 250 MCG ~~LOC~~ SOLR
2.0000 ug/kg | Freq: Once | SUBCUTANEOUS | Status: AC
  Administered 2018-09-29: 200 ug via SUBCUTANEOUS
  Filled 2018-09-29: qty 0.4

## 2018-09-29 NOTE — Progress Notes (Signed)
Thankfully, the bone marrow biopsy did not show any evidence of malignancy.  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  I will try a dose of Nplate to see if this might be able to increase megakaryocytic production.  He clearly needs dialysis today.  His BUN is 100 and creatinine is 4.  The LDH is 477.  I wonder this is coming from his liver.  He has had no liver function studies for a while.  I probably would check his LFTs today to see what they look like.  He is eating.  He is swallowing okay.  He really wants to try physical therapy.  He wants to try to get more energetic.  He wants to have more activity.  Maybe, he would be a candidate for CIR.  He has had no fever.  There is been no diarrhea.  He says he is making urine.  Clearly, the dialysis is necessary right now.  His platelet count today is 39,000.  Hemoglobin 9.5.  White cell count is 9.5.  His vital signs all look stable.  Temperature 97.4.  Pulse 90.  Blood pressure 107/76.  His head neck exam shows no thrush in the oral cavity.  He has no adenopathy in the neck.  Lungs are clear.  Cardiac exam regular rate and rhythm with occasional extra beat.  Abdomen is soft.  Bowel sounds are present and active.  There is no guarding or rebound tenderness.  Extremities shows no edema in his legs.  Neurological exam is nonfocal.  Mr. Hildebran is still recovering from this respiratory failure and renal failure condition.  Still not clear as to what triggered this.  We will have to try to work on his platelets.  Again I will try Nplate on him.  I would also check his liver function studies to see if there is any increase in liver studies that could account for the elevated LDH.  Maybe, he will start working toward rehab.  He clearly needs dialysis.  I very much appreciate the great care that he is getting from everybody in the stepdown unit.  Everybody is doing a great job with him.  [name], MD  Romans 11:33

## 2018-09-29 NOTE — Care Management Important Message (Signed)
Important Message  Patient Details  Name: [name] Rose MRN: [id number] Date of Birth: 30-May-1950   Medicare Important Message Given:  Yes     Memory Argue 09/29/2018, 4:14 PM

## 2018-09-29 NOTE — Progress Notes (Signed)
Progress Note  Patient Name: [name] Rose Date of Encounter: 09/29/2018  Primary Cardiologist: Pixie Casino, MD   Subjective   Breathing is OK  NO CP   Inpatient Medications    Scheduled Meds: . chlorhexidine  15 mL Mouth Rinse BID  . Chlorhexidine Gluconate Cloth  6 each Topical Daily  . diltiazem  30 mg Per Tube Q6H  . feeding supplement (ENSURE ENLIVE)  237 mL Oral BID BM  . feeding supplement (PRO-STAT SUGAR FREE 64)  30 mL Oral Daily  . flecainide  50 mg Oral Q12H  . furosemide  160 mg Oral TID  . gabapentin  300 mg Oral BID  . insulin aspart  0-9 Units Subcutaneous TID WC  . mouth rinse  15 mL Mouth Rinse q12n4p  . metoprolol tartrate  2.5 mg Intravenous Once  . multivitamin  1 tablet Oral QHS  . pantoprazole  40 mg Oral Daily  . sodium chloride flush  3 mL Intravenous Q12H   Continuous Infusions: . sodium chloride Stopped (09/28/18 0600)   PRN Meds: sodium chloride, acetaminophen **OR** acetaminophen, albuterol, bisacodyl, labetalol, metoprolol tartrate, [DISCONTINUED] ondansetron **OR** ondansetron (ZOFRAN) IV, senna-docusate, sennosides, sodium chloride flush, sodium chloride flush, zolpidem   Vital Signs    Vitals:   09/28/18 1104 09/28/18 2048 09/28/18 2317 09/29/18 0320  BP: 110/81 128/90 117/82 119/83  Pulse: 88 97 92 90  Resp: 19 (!) 21 (!) 23 17  Temp: 98 F (36.7 C) (!) 97.5 F (36.4 C) (!) 97.5 F (36.4 C) (!) 97.4 F (36.3 C)  TempSrc: Oral Oral Oral Oral  SpO2: 97% 100% 98% 99%  Weight:      Height:        Intake/Output Summary (Last 24 hours) at 09/29/2018 0549 Last data filed at 09/29/2018 0300 Gross per 24 hour  Intake 790 ml  Output 5250 ml  Net -4460 ml   Net neg 22.3 L    Last 3 Weights 09/28/2018 09/27/2018 09/26/2018  Weight (lbs) 250 lb 3.6 oz 253 lb 1.4 oz 248 lb 10.9 oz  Weight (kg) 113.5 kg 114.8 kg 112.8 kg      Telemetry    Afib   80s   Personally Reviewed  ECG    None today  c - Personally Reviewed   Physical Exam   GEN: No acute distress.   Neck: Difficult to assess JVP due to lines  Cardiac: Irreg irreg , no murmurs,  No rubs, or gallops.  Respiratory: bilateral rhonchi   GI: Soft, nontender, non-distended  MS:  Tr tp 1+ (R >L) UE edema TR LE edema; No deformity. Neuro:  Nonfocal  Psych: Normal affect   Labs    Chemistry Recent Labs  Lab 09/23/18 0340 09/24/18 0512 09/25/18 0517  09/27/18 0402 09/28/18 0500 09/29/18 0448  NA 137 136 139   < > 138 137 137  K 3.9 3.8 4.1   < > 3.5 3.8 4.0  CL 103 103 102   < > 103 98 93*  CO2 $Re'26 24 25   'LGi$ < > $R'25 28 30  'PO$ GLUCOSE 183* 198* 195*   < > 103* 127* 136*  BUN 42* 77* 105*   < > 77* 91* 100*  CREATININE 1.60* 3.15* 4.04*   < > 3.34* 3.66* 4.08*  CALCIUM 8.1* 7.9* 7.7*   < > 7.8* 8.2* 8.3*  PROT 5.1* 4.0* 3.7*  --   --   --   --   ALBUMIN 2.8* 2.2* 2.1*   < >  2.0* 2.3* 2.4*  AST 52* 41 40  --   --   --   --   ALT 65* 48* 43  --   --   --   --   ALKPHOS 58 48 51  --   --   --   --   BILITOT 1.0 0.7 0.9  --   --   --   --   GFRNONAA 44* 19* 14*   < > 18* 16* 14*  GFRAA 51* 22* 17*   < > 21* 19* 16*  ANIONGAP $RemoveB'8 9 12   'JVPRwpGp$ < > $R'10 11 14   'Uc$ < > = values in this interval not displayed.     Hematology Recent Labs  Lab 09/27/18 0402 09/28/18 0500 09/29/18 0448  WBC 8.0 8.8 9.5  RBC 2.51* 2.78* 2.90*  HGB 8.2* 9.1* 9.5*  HCT 26.2* 28.2* 28.9*  MCV 104.4* 101.4* 99.7  MCH 32.7 32.7 32.8  MCHC 31.3 32.3 32.9  RDW 22.6* 21.4* 20.8*  PLT 35* 33* 39*    Cardiac EnzymesNo results for input(s): TROPONINI in the last 168 hours. No results for input(s): TROPIPOC in the last 168 hours.   BNPNo results for input(s): BNP, PROBNP in the last 168 hours.   DDimer No results for input(s): DDIMER in the last 168 hours.   Radiology    No results found.  Cardiac Studies  Relevant CV Studies: Echo 06/22/18 IMPRESSIONS   1. The left ventricle has normal systolic function, with an ejection fraction of 55-60%. The cavity size was  normal. Left ventricular diastolic Doppler parameters are consistent with impaired relaxation. 2. The right ventricle has normal systolic function. The cavity was mildly enlarged. There is no increase in right ventricular wall thickness. 3. Right atrial size was mildly dilated. 4. Moderate thickening of the aortic valve Mild calcification of the aortic valve. 5. There is dilatation of the aortic root measuring 39 mm. 6. No vegetations detected.  FINDINGS Left Ventricle: The left ventricle has normal systolic function, with an ejection fraction of 55-60%. The cavity size was normal. There is no increase in left ventricular wall thickness. Left ventricular diastolic Doppler parameters are consistent with  impaired relaxation. Definity contrast agent was given IV to delineate the left ventricular endocardial borders. Right Ventricle: The right ventricle has normal systolic function. The cavity was mildly enlarged. There is no increase in right ventricular wall thickness. Left Atrium: left atrial size was normal in size Right Atrium: right atrial size was mildly dilated. Right atrial pressure is estimated at 3 mmHg. Interatrial Septum: No atrial level shunt detected by color flow Doppler. Pericardium: There is no evidence of pericardial effusion. Mitral Valve: The mitral valve is normal in structure. Mitral valve regurgitation was not assessed by color flow Doppler. Tricuspid Valve: The tricuspid valve is normal in structure. Tricuspid valve regurgitation was not visualized by color flow Doppler. Aortic Valve: The aortic valve is normal in structure. Moderate thickening of the aortic valve Mild calcification of the aortic valve. Aortic valve regurgitation was not visualized by color flow Doppler. Pulmonic Valve: The pulmonic valve was normal in structure. Pulmonic valve regurgitation is not visualized by color flow Doppler. Aorta: There is dilatation of the aortic root measuring 39 mm. Venous:  The inferior vena cava is normal in size with greater than 50% respiratory variability.  TEE 06/27/18 IMPRESSIONS   1. The left ventricle has normal systolic function, with an ejection fraction of 55-60%. 2. Moderate mitral regurgitation. 3. No valvular  vegetations seen. 4. 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  FINDINGS Left Ventricle: The left ventricle has normal systolic function, with an ejection fraction of 55-60%. Right Ventricle: The right ventricle has normal systolic function. Left Atrium: Left atrial size was normal in size. Right Atrium: Right atrial size was normal in size. Interatrial Septum: No atrial level shunt detected by color flow Doppler. Mitral Valve: The mitral valve is grossly normal. Mitral valve regurgitation is moderate by color flow Doppler. There is no evidence of mitral valve vegetation. Tricuspid Valve: The tricuspid valve was grossly normal. Tricuspid valve regurgitation is trivial by color flow Doppler. No TV vegetation was visualized. Aortic Valve: The aortic valve is tricuspid Mild thickening of the aortic valve. There is no stenosis of the aortic valve. There is no evidence of a vegetation on the aortic valve. Pulmonic Valve: The pulmonic valve was grossly normal. Pulmonic valve regurgitation is not visualized by color flow Doppler. Aorta: The ascending aorta is normal in size and structure.  LV Wall Scoring:  MR Peak grad: 109.4 mmHg MR Mean grad: 74.0 mmHg MR Vmax: 523.00 cm/s MR Vmean: 398.0 cm/s   Manish Patwardhan MD Electronically signed by Vernell Leep MD Signature Date/Time: 06/27/2018/5:27:15 PM  Patient Profile     [name] Rose is a 68 y.o. male with a hx of metastatic bladder cancer and hx of sepsis and MSSA bacteremia, PAF with hospitalization but could not be anticoagulated due anemia and thrombocytopenia (chemo) and rate control alone was planned, not rhythm control, mod MR, on TEE and  no endocarditis that admit who is being seen today for the evaluation of atria fib RVR at the request of Dr. Lonny Prude  Assessment & Plan    1  PAF Pt remains in Afib   Rates controlled    Flecanide stopped yesterday   At low dose did  not work to keep patient out of afib   Rates OK and pt tolerating afib   (LVEF normal) The pt's  CHADS2VASc is 3   No anticoagulation due to low plts  2  Heme  Plt 39K   Hgb 9.5   5   CKD Renal following  Cr 4.08 today   Some urine output  [place] on HD    7   CAD   Atherosclerosis  found on CT  Pt with no symptoms of  angina    Will make sure pt hs appt this summer for f/u   Will sign off for now.    For questions or updates, please contact [place] Please consult [email or web address] for contact info under        Signed, Dorris Carnes, MD  09/29/2018, 5:49 AM

## 2018-09-29 NOTE — Progress Notes (Addendum)
Inpatient Rehab Admissions:  Inpatient Rehab Consult received.  I met with pt at the bedside for rehabilitation assessment. Pt interested in the program and would prefer to stay in the hospital for his rehab if he qualifies for CIR. AC discussed that right now we will need to see if his tolerance with therapies improves while he continues to progress with his medical workup (noted vitals did not support OOB transfers at this time per last PT note). Will continue to follow.   [name], OTR/L  Rehab Admissions Coordinator  [phone] 09/29/2018 5:49 PM

## 2018-09-29 NOTE — Progress Notes (Signed)
PROGRESS NOTE    [name] Rose  MVH:[id number] DOB: 1950-07-03 DOA: 09/08/2018 PCP: Shirline Frees, MD   Brief Narrative: [name] Rose is a 68 y.o. male who presented with respiratory failure and fever secondary to presumed pneumonia. He was started empirically with antibiotics. COVID-19 negative. He worsened requiring mechanical ventilation and CRRT for acute renal failure. He was successfully transfused with plans to start intermittent HD for continued renal failure. Course has also been complicated by anemia and atrial fibrillation with RVR.   Assessment & Plan:   Principal Problem:   Pneumonia Active Problems:   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 (HCC)   LFT elevation   AKI (acute kidney injury) ([place])   CKD (chronic kidney disease), stage III (HCC)   Acute respiratory failure with hypoxia (HCC)   Acute renal failure (HCC)   Acute renal failure with acute tubular necrosis superimposed on stage 3 chronic kidney disease ([place])   Metastatic cancer ([place])   Palliative care encounter   Chronic atrial fibrillation   Pneumonia Treated empirically with antibiotics. COVID-19 negative x2. ID consulted and recommending discontinuation of antibiotics on 6/8.  Acute respiratory failure with hypoxia Unsure of etiology, but likely pneumonia. Required mechanical ventilation from 6/2 to 6/10. Steroids burst completed. Acute worsening secondary to below which has resolved. -Keep O2 saturations >92% -Wean to room air as able  Atrial fibrillation w/ RVR Paroxysmal atrial fibrillation on metoprolol and diltiazem as an outpatient. Recurrent episodes of RVR. Currently rate controlled. -Continue diltiazem -Cardiology consult/recommendations: started Flecainide, now discontinued. Outpatient follow-up  Hypotension Acutely worsened since giving metoprolol in addition to anemia. Resolved.  Acute kidney injury on CKD stage 3 Baseline creatinine of 1.7-1.8.  Creatinine of 2.3 on admission in setting of hypotension. Progressed to requiring CRRT while in the ICU. Nephrology on board. Now transitioning to IHD. Patient with 4.9 L UOP over the last 24 hours. BUN/creatinine increasing -Nephrology recommendations: HD decision daily; lasix  Acute on chronic anemia Baseline hemoglobin of 9-10. Down to 7.8 today. Unknown etiology. No bleeding noted. Hematology on board with concern for possible renal etiology vs possible BM etiology. S/p 4 units of PRBC to date. Stable. -CBC per hematology  Chronic thrombocytopenia Stable. No malignancy noted on bone biopsy -Hematology recommendations  Essential hypertension Hypotension improved with resolution of RVR and transfusion. On diltiazem, metoprolol, losartan as an outpatient. -Continue diltiazem  Metastatic bladder cancer Currently on chemotherapy treatment. Follows with [name]. Last chemotherapy treatment was 5/18 per patient.  Dysphagia Severe aspiration risk per SLP evaluation on 6/12. Currently on tube feeds but advanced to a dysphagia 3 diet on 6/15. NG tube removed on 6/17 -Continue Dysphagia 3 diet -SLP ongoing recommendations   DVT prophylaxis: SCDs Code Status:   Code Status: Full Code Family Communication: None Disposition Plan: Discharge pending further clinical improvement and pending renal function improvement vs transition to ESRD   Consultants:   PCCM  Nephrology  Cardiology  Palliative care  Procedures:   CRRT  Antimicrobials:  Vancomycin  Cefepime  Bactrim    Subjective: No issues.  Objective: Vitals:   09/29/18 0320 09/29/18 0500 09/29/18 0600 09/29/18 0744  BP: 119/83  107/76 108/85  Pulse: 90   85  Resp: 17   18  Temp: (!) 97.4 F (36.3 C)   97.7 F (36.5 C)  TempSrc: Oral   Oral  SpO2: 99%   99%  Weight:  101.2 kg    Height:        Intake/Output Summary (  Last 24 hours) at 09/29/2018 1101 Last data filed at 09/29/2018 0745 Gross per 24  hour  Intake 1020 ml  Output 5100 ml  Net -4080 ml   Filed Weights   09/27/18 0610 09/28/18 0344 09/29/18 0500  Weight: 114.8 kg 113.5 kg 101.2 kg    Examination:  General exam: Appears calm and comfortable Respiratory system: Minimal rales. Respiratory effort normal. Cardiovascular system: S1 & S2 heard, RRR. No murmurs, rubs, gallops or clicks. Gastrointestinal system: Abdomen is nondistended, soft and nontender. No organomegaly or masses felt. Normal bowel sounds heard. Central nervous system: Alert and oriented. No focal neurological deficits. Extremities: No calf tenderness Skin: No cyanosis. No rashes Psychiatry: Judgement and insight appear normal. Mood & affect appropriate.     Data Reviewed: I have personally reviewed following labs and imaging studies  CBC: Recent Labs  Lab 09/23/18 0340 09/24/18 0512  09/25/18 0517 09/26/18 0423 09/27/18 0402 09/28/18 0500 09/29/18 0448  WBC 16.7* 13.1*   < > 11.6* 10.1 8.0 8.8 9.5  NEUTROABS 15.0* 11.4*  --  9.9*  --   --   --   --   HGB 7.9* 6.6*   < > 8.3* 8.7* 8.2* 9.1* 9.5*  HCT 25.1* 21.4*   < > 26.1* 27.2* 26.2* 28.2* 28.9*  MCV 104.1* 107.0*   < > 103.2* 103.8* 104.4* 101.4* 99.7  PLT 43* 36*   < > 31* 34* 35* 33* 39*   < > = values in this interval not displayed.   Basic Metabolic Panel: Recent Labs  Lab 09/22/18 1102  09/22/18 1617 09/23/18 0340  09/26/18 0423 09/26/18 1105 09/27/18 0402 09/28/18 0500 09/29/18 0448  NA  --    < > 136 137   < > 138 131* 138 137 137  K  --    < > 4.2 3.9   < > 3.6 3.3* 3.5 3.8 4.0  CL  --    < > 101 103   < > 101 97* 103 98 93*  CO2  --    < > 25 26   < > $R'27 26 25 28 30  'jg$ GLUCOSE  --    < > 157* 183*   < > 117* 102* 103* 127* 136*  BUN  --    < > 40* 42*   < > 55* 55* 77* 91* 100*  CREATININE  --    < > 1.77* 1.60*   < > 2.34* 2.41* 3.34* 3.66* 4.08*  CALCIUM  --    < > 8.3* 8.1*   < > 7.6* 7.4* 7.8* 8.2* 8.3*  MG 2.8*  --  2.6* 2.7*  --   --   --   --   --   --   PHOS  5.7*  --  5.5*  5.5* 4.4  --  3.2 3.5 4.6 5.1* 5.9*   < > = values in this interval not displayed.   GFR: Estimated Creatinine Clearance: 22.3 mL/min (A) (by C-G formula based on SCr of 4.08 mg/dL (H)). Liver Function Tests: Recent Labs  Lab 09/23/18 0340 09/24/18 0512 09/25/18 0517  09/26/18 1105 09/27/18 0402 09/28/18 0500 09/29/18 0448 09/29/18 1010  AST 52* 41 40  --   --   --   --   --  42*  ALT 65* 48* 43  --   --   --   --   --  43  ALKPHOS 58 48 51  --   --   --   --   --  59  BILITOT 1.0 0.7 0.9  --   --   --   --   --  0.8  PROT 5.1* 4.0* 3.7*  --   --   --   --   --  4.8*  ALBUMIN 2.8* 2.2* 2.1*   < > 2.2* 2.0* 2.3* 2.4* 2.4*   < > = values in this interval not displayed.   No results for input(s): LIPASE, AMYLASE in the last 168 hours. No results for input(s): AMMONIA in the last 168 hours. Coagulation Profile: Recent Labs  Lab 09/25/18 1211  INR 1.1   Cardiac Enzymes: No results for input(s): CKTOTAL, CKMB, CKMBINDEX, TROPONINI in the last 168 hours. BNP (last 3 results) No results for input(s): PROBNP in the last 8760 hours. HbA1C: No results for input(s): HGBA1C in the last 72 hours. CBG: Recent Labs  Lab 09/28/18 0600 09/28/18 1106 09/28/18 1616 09/28/18 2147 09/29/18 0612  GLUCAP 131* 164* 115* 133* 123*   Lipid Profile: No results for input(s): CHOL, HDL, LDLCALC, TRIG, CHOLHDL, LDLDIRECT in the last 72 hours. Thyroid Function Tests: No results for input(s): TSH, T4TOTAL, FREET4, T3FREE, THYROIDAB in the last 72 hours. Anemia Panel: No results for input(s): VITAMINB12, FOLATE, FERRITIN, TIBC, IRON, RETICCTPCT in the last 72 hours. Sepsis Labs: No results for input(s): PROCALCITON, LATICACIDVEN in the last 168 hours.  No results found for this or any previous visit (from the past 240 hour(s)).       Radiology Studies: No results found.      Scheduled Meds: . chlorhexidine  15 mL Mouth Rinse BID  . Chlorhexidine Gluconate Cloth   6 each Topical Daily  . diltiazem  30 mg Per Tube Q6H  . feeding supplement (ENSURE ENLIVE)  237 mL Oral BID BM  . feeding supplement (PRO-STAT SUGAR FREE 64)  30 mL Oral Daily  . flecainide  50 mg Oral Q12H  . furosemide  160 mg Oral TID  . gabapentin  300 mg Oral BID  . insulin aspart  0-9 Units Subcutaneous TID WC  . mouth rinse  15 mL Mouth Rinse q12n4p  . metoprolol tartrate  2.5 mg Intravenous Once  . multivitamin  1 tablet Oral QHS  . pantoprazole  40 mg Oral Daily  . sodium chloride flush  3 mL Intravenous Q12H   Continuous Infusions: . sodium chloride Stopped (09/28/18 0600)     LOS: 21 days     Cordelia Poche, MD [facility] 09/29/2018, 11:01 AM  If 7PM-7AM, please contact night-coverage [email or web address]

## 2018-09-29 NOTE — Progress Notes (Signed)
[facility]    NEPHROLOGY PROGRESS NOTE  SUBJECTIVE: Patient seen and examined.  No acute complaints.  Denies chest pain, shortness of breath, nausea, vomiting, diarrhea or dysuria.  All other review of systems are negative.   OBJECTIVE:  Vitals:   09/29/18 0744 09/29/18 1124  BP: 108/85 (!) 119/92  Pulse: 85 76  Resp: 18 (!) 21  Temp: 97.7 F (36.5 C) 97.6 F (36.4 C)  SpO2: 99% 100%    Intake/Output Summary (Last 24 hours) at 09/29/2018 1309 Last data filed at 09/29/2018 1241 Gross per 24 hour  Intake 1260 ml  Output 5050 ml  Net -3790 ml      General:  AAOx3 NAD HEENT: MMM [place] AT anicteric sclera Neck:  No JVD, no adenopathy CV:  Heart RRR  Lungs:  L/S fine bibasilar rales Abd:  abd SNT/ND with normal BS GU:  Bladder non-palpable Extremities: +1 bilateral LE edema. Skin:  No skin rash  MEDICATIONS:  . chlorhexidine  15 mL Mouth Rinse BID  . Chlorhexidine Gluconate Cloth  6 each Topical Daily  . diltiazem  30 mg Per Tube Q6H  . feeding supplement (ENSURE ENLIVE)  237 mL Oral BID BM  . feeding supplement (PRO-STAT SUGAR FREE 64)  30 mL Oral Daily  . flecainide  50 mg Oral Q12H  . furosemide  160 mg Oral TID  . gabapentin  300 mg Oral BID  . insulin aspart  0-9 Units Subcutaneous TID WC  . mouth rinse  15 mL Mouth Rinse q12n4p  . metoprolol tartrate  2.5 mg Intravenous Once  . multivitamin  1 tablet Oral QHS  . pantoprazole  40 mg Oral Daily  . sodium chloride flush  3 mL Intravenous Q12H       LABS:   CBC Latest Ref Rng & Units 09/29/2018 09/28/2018 09/27/2018  WBC 4.0 - 10.5 K/uL 9.5 8.8 8.0  Hemoglobin 13.0 - 17.0 g/dL 9.5(L) 9.1(L) 8.2(L)  Hematocrit 39.0 - 52.0 % 28.9(L) 28.2(L) 26.2(L)  Platelets 150 - 400 K/uL 39(L) 33(L) 35(L)    CMP Latest Ref Rng & Units 09/29/2018 09/28/2018 09/27/2018  Glucose 70 - 99 mg/dL 136(H) 127(H) 103(H)  BUN 8 - 23 mg/dL 100(H) 91(H) 77(H)  Creatinine 0.61 - 1.24 mg/dL 4.08(H) 3.66(H) 3.34(H)  Sodium  135 - 145 mmol/L 137 137 138  Potassium 3.5 - 5.1 mmol/L 4.0 3.8 3.5  Chloride 98 - 111 mmol/L 93(L) 98 103  CO2 22 - 32 mmol/L $RemoveB'30 28 25  'cQRrupVC$ Calcium 8.9 - 10.3 mg/dL 8.3(L) 8.2(L) 7.8(L)  Total Protein 6.5 - 8.1 g/dL 4.8(L) - -  Total Bilirubin 0.3 - 1.2 mg/dL 0.8 - -  Alkaline Phos 38 - 126 U/L 59 - -  AST 15 - 41 U/L 42(H) - -  ALT 0 - 44 U/L 43 - -    Lab Results  Component Value Date   PTH 96 (H) 09/25/2018   CALCIUM 8.3 (L) 09/29/2018   CAION 1.19 09/20/2016   PHOS 5.9 (H) 09/29/2018       Component Value Date/Time   COLORURINE YELLOW 09/18/2018 1302   APPEARANCEUR CLOUDY (A) 09/18/2018 1302   LABSPEC 1.011 09/18/2018 1302   PHURINE 5.0 09/18/2018 1302   GLUCOSEU >=500 (A) 09/18/2018 1302   HGBUR MODERATE (A) 09/18/2018 1302   BILIRUBINUR NEGATIVE 09/18/2018 1302   KETONESUR NEGATIVE 09/18/2018 1302   PROTEINUR NEGATIVE 09/18/2018 1302   UROBILINOGEN 0.2 01/31/2010 0624   NITRITE NEGATIVE 09/18/2018 1302   LEUKOCYTESUR NEGATIVE 09/18/2018  1302      Component Value Date/Time   PHART 7.255 (L) 09/17/2018 0317   PCO2ART 39.0 09/17/2018 0317   PO2ART 66.3 (L) 09/17/2018 0317   HCO3 16.7 (L) 09/17/2018 0317   TCO2 29 09/20/2016 0657   ACIDBASEDEF 9.2 (H) 09/17/2018 0317   O2SAT 90.7 09/17/2018 0317       Component Value Date/Time   IRON 88 07/24/2018 1230   TIBC 240 07/24/2018 1230   FERRITIN 3,307 (H) 09/08/2018 1047   IRONPCTSAT 37 07/24/2018 1230       ASSESSMENT/PLAN:     68 year old male patient who presented with respiratory failure and febrile secondary to presumed pneumonia.  He was started on empiric antibiotics.  COVID-19 was negative.  His respiratory status worsened requiring mechanical ventilation and CRRT for acute renal failure.  He is currently on intermittent hemodialysis as needed.  1.  Chronic kidney disease stage III.  His baseline serum creatinine runs around 1.4-1.5.  I suspect his chronic kidney disease on the basis of longstanding diabetes.   His urine is negative for protein.  Renal ultrasound from 09/18/2018 was negative for hydronephrosis.  PTH 96 from 09/25/2018.  Phosphorus from today was 5.9.  We will add phosphate binder.  2.  Acute kidney injury.  Appears to have some improvement in renal function.  While creatinine is increasing, urine output is excellent.  We will continue to monitor off of dialysis.  3.  Secondary hyperparathyroidism.  We will add a phosphate binder for hyperphosphatemia.  4.  Anemia.  Hemoglobin is stable at 9.5.  5.  Pneumonia.  Respiratory status is improving.     [place], DO, [place]

## 2018-09-30 LAB — RENAL FUNCTION PANEL
Albumin: 2.6 g/dL — ABNORMAL LOW (ref 3.5–5.0)
Anion gap: 15 (ref 5–15)
BUN: 104 mg/dL — ABNORMAL HIGH (ref 8–23)
CO2: 33 mmol/L — ABNORMAL HIGH (ref 22–32)
Calcium: 8.5 mg/dL — ABNORMAL LOW (ref 8.9–10.3)
Chloride: 88 mmol/L — ABNORMAL LOW (ref 98–111)
Creatinine, Ser: 4.31 mg/dL — ABNORMAL HIGH (ref 0.61–1.24)
GFR calc Af Amer: 15 mL/min — ABNORMAL LOW (ref >60)
GFR calc non Af Amer: 13 mL/min — ABNORMAL LOW (ref >60)
Glucose, Bld: 137 mg/dL — ABNORMAL HIGH (ref 70–99)
Phosphorus: 6.6 mg/dL — ABNORMAL HIGH (ref 2.5–4.6)
Potassium: 3.3 mmol/L — ABNORMAL LOW (ref 3.5–5.1)
Sodium: 136 mmol/L (ref 135–145)

## 2018-09-30 LAB — CBC
HCT: 31.2 % — ABNORMAL LOW (ref 39.0–52.0)
Hemoglobin: 10.2 g/dL — ABNORMAL LOW (ref 13.0–17.0)
MCH: 32.6 pg (ref 26.0–34.0)
MCHC: 32.7 g/dL (ref 30.0–36.0)
MCV: 99.7 fL (ref 80.0–100.0)
Platelets: 49 10*3/uL — ABNORMAL LOW (ref 150–400)
RBC: 3.13 MIL/uL — ABNORMAL LOW (ref 4.22–5.81)
RDW: 20.3 % — ABNORMAL HIGH (ref 11.5–15.5)
WBC: 10.7 10*3/uL — ABNORMAL HIGH (ref 4.0–10.5)
nRBC: 0 % (ref 0.0–0.2)

## 2018-09-30 LAB — GLUCOSE, CAPILLARY
Glucose-Capillary: 125 mg/dL — ABNORMAL HIGH (ref 70–99)
Glucose-Capillary: 126 mg/dL — ABNORMAL HIGH (ref 70–99)
Glucose-Capillary: 136 mg/dL — ABNORMAL HIGH (ref 70–99)
Glucose-Capillary: 149 mg/dL — ABNORMAL HIGH (ref 70–99)

## 2018-09-30 LAB — LACTATE DEHYDROGENASE: LDH: 382 U/L — ABNORMAL HIGH (ref 98–192)

## 2018-09-30 MED ORDER — FUROSEMIDE 80 MG PO TABS
80.0000 mg | ORAL_TABLET | Freq: Two times a day (BID) | ORAL | Status: DC
  Administered 2018-09-30 – 2018-10-01 (×2): 80 mg via ORAL
  Filled 2018-09-30 (×2): qty 1

## 2018-09-30 MED ORDER — IPRATROPIUM-ALBUTEROL 0.5-2.5 (3) MG/3ML IN SOLN
RESPIRATORY_TRACT | Status: AC
  Filled 2018-09-30: qty 3

## 2018-09-30 NOTE — Progress Notes (Signed)
[facility]    NEPHROLOGY PROGRESS NOTE  SUBJECTIVE: Patient seen and examined.  No acute complaints.  Denies chest pain, shortness of breath, nausea, vomiting, diarrhea or dysuria.  All other review of systems are negative.   OBJECTIVE:  Vitals:   09/30/18 0400 09/30/18 0749  BP: 109/68 101/70  Pulse:  (!) 103  Resp: 13 (!) 23  Temp: 97.6 F (36.4 C) 97.7 F (36.5 C)  SpO2: 94% 99%    Intake/Output Summary (Last 24 hours) at 09/30/2018 1046 Last data filed at 09/30/2018 1000 Gross per 24 hour  Intake 1237 ml  Output 4700 ml  Net -3463 ml      General:  AAOx3 NAD HEENT: MMM [name] AT anicteric sclera Neck:  No JVD, no adenopathy CV:  Heart RRR  Lungs:  L/S fine bibasilar rales Abd:  abd SNT/ND with normal BS GU:  Bladder non-palpable Extremities: +1 bilateral LE edema. Skin:  No skin rash  MEDICATIONS:  . chlorhexidine  15 mL Mouth Rinse BID  . Chlorhexidine Gluconate Cloth  6 each Topical Daily  . diltiazem  30 mg Per Tube Q6H  . feeding supplement (ENSURE ENLIVE)  237 mL Oral BID BM  . feeding supplement (PRO-STAT SUGAR FREE 64)  30 mL Oral Daily  . flecainide  50 mg Oral Q12H  . furosemide  160 mg Oral TID  . gabapentin  300 mg Oral BID  . insulin aspart  0-9 Units Subcutaneous TID WC  . ipratropium-albuterol      . mouth rinse  15 mL Mouth Rinse q12n4p  . metoprolol tartrate  2.5 mg Intravenous Once  . multivitamin  1 tablet Oral QHS  . pantoprazole  40 mg Oral Daily  . sevelamer carbonate  800 mg Oral TID WC  . sodium chloride flush  3 mL Intravenous Q12H       LABS:   CBC Latest Ref Rng & Units 09/30/2018 09/29/2018 09/28/2018  WBC 4.0 - 10.5 K/uL 10.7(H) 9.5 8.8  Hemoglobin 13.0 - 17.0 g/dL 10.2(L) 9.5(L) 9.1(L)  Hematocrit 39.0 - 52.0 % 31.2(L) 28.9(L) 28.2(L)  Platelets 150 - 400 K/uL 49(L) 39(L) 33(L)    CMP Latest Ref Rng & Units 09/30/2018 09/29/2018 09/28/2018  Glucose 70 - 99 mg/dL 137(H) 136(H) 127(H)  BUN 8 - 23 mg/dL 104(H)  100(H) 91(H)  Creatinine 0.61 - 1.24 mg/dL 4.31(H) 4.08(H) 3.66(H)  Sodium 135 - 145 mmol/L 136 137 137  Potassium 3.5 - 5.1 mmol/L 3.3(L) 4.0 3.8  Chloride 98 - 111 mmol/L 88(L) 93(L) 98  CO2 22 - 32 mmol/L 33(H) 30 28  Calcium 8.9 - 10.3 mg/dL 8.5(L) 8.3(L) 8.2(L)  Total Protein 6.5 - 8.1 g/dL - 4.8(L) -  Total Bilirubin 0.3 - 1.2 mg/dL - 0.8 -  Alkaline Phos 38 - 126 U/L - 59 -  AST 15 - 41 U/L - 42(H) -  ALT 0 - 44 U/L - 43 -    Lab Results  Component Value Date   PTH 96 (H) 09/25/2018   CALCIUM 8.5 (L) 09/30/2018   CAION 1.19 09/20/2016   PHOS 6.6 (H) 09/30/2018       Component Value Date/Time   COLORURINE YELLOW 09/18/2018 1302   APPEARANCEUR CLOUDY (A) 09/18/2018 1302   LABSPEC 1.011 09/18/2018 1302   PHURINE 5.0 09/18/2018 1302   GLUCOSEU >=500 (A) 09/18/2018 1302   HGBUR MODERATE (A) 09/18/2018 1302   BILIRUBINUR NEGATIVE 09/18/2018 [place] 09/18/2018 1302   PROTEINUR NEGATIVE 09/18/2018 1302  UROBILINOGEN 0.2 01/31/2010 0624   NITRITE NEGATIVE 09/18/2018 1302   LEUKOCYTESUR NEGATIVE 09/18/2018 1302      Component Value Date/Time   PHART 7.255 (L) 09/17/2018 0317   PCO2ART 39.0 09/17/2018 0317   PO2ART 66.3 (L) 09/17/2018 0317   HCO3 16.7 (L) 09/17/2018 0317   TCO2 29 09/20/2016 0657   ACIDBASEDEF 9.2 (H) 09/17/2018 0317   O2SAT 90.7 09/17/2018 0317       Component Value Date/Time   IRON 88 07/24/2018 1230   TIBC 240 07/24/2018 1230   FERRITIN 3,307 (H) 09/08/2018 1047   IRONPCTSAT 37 07/24/2018 1230       ASSESSMENT/PLAN:     68 year old male patient who presented with respiratory failure and febrile secondary to presumed pneumonia.  He was started on empiric antibiotics.  COVID-19 was negative.  His respiratory status worsened requiring mechanical ventilation and CRRT for acute renal failure.  He is currently on intermittent hemodialysis as needed.  1.  Chronic kidney disease stage III.  His baseline serum creatinine runs around  1.4-1.5.  I suspect his chronic kidney disease on the basis of longstanding diabetes.  His urine is negative for protein.  Renal ultrasound from 09/18/2018 was negative for hydronephrosis.  PTH 96 from 09/25/2018.    2.  Acute kidney injury.  Appears to have some improvement in renal function.  While creatinine is increasing, urine output is excellent.  We will continue to monitor off of dialysis.  Will decrease lasix dose.  3.  Secondary hyperparathyroidism.  We will add a phosphate binder for hyperphosphatemia.  4.  Anemia.  Hemoglobin is stable at 9.5.  5.  Pneumonia.  Respiratory status is improving.     [place], DO, [place]

## 2018-09-30 NOTE — Progress Notes (Signed)
PROGRESS NOTE    [name] Rose  PYK:[id number] DOB: 10-Dec-1950 DOA: 09/08/2018 PCP: Shirline Frees, MD   Brief Narrative: [name] Rose is a 68 y.o. male who presented with respiratory failure and fever secondary to presumed pneumonia. He was started empirically with antibiotics. COVID-19 negative. He worsened requiring mechanical ventilation and CRRT for acute renal failure. He was successfully transfused with plans to start intermittent HD for continued renal failure. Course has also been complicated by anemia and atrial fibrillation with RVR.   Assessment & Plan:   Principal Problem:   Pneumonia Active Problems:   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 (HCC)   LFT elevation   AKI (acute kidney injury) ([place])   CKD (chronic kidney disease), stage III (HCC)   Acute respiratory failure with hypoxia (HCC)   Acute renal failure (HCC)   Acute renal failure with acute tubular necrosis superimposed on stage 3 chronic kidney disease ([place])   Metastatic cancer ([place])   Palliative care encounter   Chronic atrial fibrillation   Pneumonia Treated empirically with antibiotics. COVID-19 negative x2. ID consulted and recommending discontinuation of antibiotics on 6/8.  Acute respiratory failure with hypoxia Unsure of etiology, but likely pneumonia. Required mechanical ventilation from 6/2 to 6/10. Steroids burst completed. Acute worsening secondary to atrial fibrillation which has resolved. -Keep O2 saturations >92% -Wean to room air as able  Atrial fibrillation w/ RVR Paroxysmal atrial fibrillation on metoprolol and diltiazem as an outpatient. Recurrent episodes of RVR. Currently rate controlled. -Continue diltiazem -Cardiology consult/recommendations: started Flecainide, now discontinued. Outpatient follow-up  Hypotension Acutely worsened since giving metoprolol in addition to anemia. Resolved.  Acute kidney injury on CKD stage 3 Baseline creatinine of  1.7-1.8. Creatinine of 2.3 on admission in setting of hypotension. Progressed to requiring CRRT while in the ICU. Nephrology on board. Now transitioning to IHD. Patient with 4.2 L UOP over the last 24 hours. BUN/creatinine continues increasing -Nephrology recommendations: HD decision daily; lasix  Acute on chronic anemia Baseline hemoglobin of 9-10. Down to 7.8 today. Unknown etiology. No bleeding noted. Hematology on board with concern for possible renal etiology vs possible BM etiology. S/p 4 units of PRBC to date. Stable. -CBC per hematology  Chronic thrombocytopenia Stable and starting to improve. No malignancy noted on bone biopsy -Hematology recommendations  Essential hypertension Hypotension improved with resolution of RVR and transfusion. On diltiazem, metoprolol, losartan as an outpatient. -Continue diltiazem  Metastatic bladder cancer Currently on chemotherapy treatment. Follows with [name]. Last chemotherapy treatment was 5/18 per patient.  Dysphagia Severe aspiration risk per SLP evaluation on 6/12. Currently on tube feeds but advanced to a dysphagia 3 diet on 6/15. NG tube removed on 6/17 -Continue Dysphagia 3 diet -SLP ongoing recommendations   DVT prophylaxis: SCDs Code Status:   Code Status: Full Code Family Communication: None Disposition Plan: Discharge to CIR vs SNF (if does not qualify for CIR) pending further clinical improvement and pending renal function improvement vs transition to ESRD   Consultants:   PCCM  Nephrology  Cardiology  Palliative care  Procedures:   CRRT  Antimicrobials:  Vancomycin  Cefepime  Bactrim    Subjective: No concerns today. No chest pain or dyspnea. Telemetry personally reviewed significant for infrequent PVCs  Objective: Vitals:   09/29/18 2328 09/29/18 2335 09/30/18 0400 09/30/18 0749  BP: 115/72  109/68 101/70  Pulse: (!) 108   (!) 103  Resp: 19  13 (!) 23  Temp: 97.8 F (36.6 C)  97.6 F (36.4  C) 97.7 F (36.5 C)  TempSrc: Oral  Oral Oral  SpO2: 97% 93% 94% 99%  Weight:   101.4 kg   Height:        Intake/Output Summary (Last 24 hours) at 09/30/2018 0813 Last data filed at 09/30/2018 0754 Gross per 24 hour  Intake 1237 ml  Output 4400 ml  Net -3163 ml   Filed Weights   09/28/18 0344 09/29/18 0500 09/30/18 0400  Weight: 113.5 kg 101.2 kg 101.4 kg    Examination:  General exam: Appears calm and comfortable Respiratory system: Clear to auscultation. Respiratory effort normal. Cardiovascular system: S1 & S2 heard, RRR. No murmurs, rubs, gallops or clicks. Gastrointestinal system: Abdomen is nondistended, soft and nontender. No organomegaly or masses felt. Normal bowel sounds heard. Central nervous system: Alert and oriented. No focal neurological deficits. Extremities: No edema. No calf tenderness Skin: No cyanosis. No rashes Psychiatry: Judgement and insight appear normal. Mood & affect appropriate.      Data Reviewed: I have personally reviewed following labs and imaging studies  CBC: Recent Labs  Lab 09/24/18 0512  09/25/18 0517 09/26/18 0423 09/27/18 0402 09/28/18 0500 09/29/18 0448 09/30/18 0413  WBC 13.1*   < > 11.6* 10.1 8.0 8.8 9.5 10.7*  NEUTROABS 11.4*  --  9.9*  --   --   --   --   --   HGB 6.6*   < > 8.3* 8.7* 8.2* 9.1* 9.5* 10.2*  HCT 21.4*   < > 26.1* 27.2* 26.2* 28.2* 28.9* 31.2*  MCV 107.0*   < > 103.2* 103.8* 104.4* 101.4* 99.7 99.7  PLT 36*   < > 31* 34* 35* 33* 39* 49*   < > = values in this interval not displayed.   Basic Metabolic Panel: Recent Labs  Lab 09/26/18 1105 09/27/18 0402 09/28/18 0500 09/29/18 0448 09/30/18 0413  NA 131* 138 137 137 136  K 3.3* 3.5 3.8 4.0 3.3*  CL 97* 103 98 93* 88*  CO2 $Re'26 25 28 30 'LTE$ 33*  GLUCOSE 102* 103* 127* 136* 137*  BUN 55* 77* 91* 100* 104*  CREATININE 2.41* 3.34* 3.66* 4.08* 4.31*  CALCIUM 7.4* 7.8* 8.2* 8.3* 8.5*  PHOS 3.5 4.6 5.1* 5.9* 6.6*   GFR: Estimated Creatinine Clearance: 21.1  mL/min (A) (by C-G formula based on SCr of 4.31 mg/dL (H)). Liver Function Tests: Recent Labs  Lab 09/24/18 0512 09/25/18 0517  09/27/18 0402 09/28/18 0500 09/29/18 0448 09/29/18 1010 09/30/18 0413  AST 41 40  --   --   --   --  42*  --   ALT 48* 43  --   --   --   --  43  --   ALKPHOS 48 51  --   --   --   --  59  --   BILITOT 0.7 0.9  --   --   --   --  0.8  --   PROT 4.0* 3.7*  --   --   --   --  4.8*  --   ALBUMIN 2.2* 2.1*   < > 2.0* 2.3* 2.4* 2.4* 2.6*   < > = values in this interval not displayed.   No results for input(s): LIPASE, AMYLASE in the last 168 hours. No results for input(s): AMMONIA in the last 168 hours. Coagulation Profile: Recent Labs  Lab 09/25/18 1211  INR 1.1   Cardiac Enzymes: No results for input(s): CKTOTAL, CKMB, CKMBINDEX, TROPONINI in the last 168 hours. BNP (last 3  results) No results for input(s): PROBNP in the last 8760 hours. HbA1C: No results for input(s): HGBA1C in the last 72 hours. CBG: Recent Labs  Lab 09/29/18 0612 09/29/18 1124 09/29/18 1626 09/29/18 2101 09/30/18 0606  GLUCAP 123* 140* 167* 121* 125*   Lipid Profile: No results for input(s): CHOL, HDL, LDLCALC, TRIG, CHOLHDL, LDLDIRECT in the last 72 hours. Thyroid Function Tests: No results for input(s): TSH, T4TOTAL, FREET4, T3FREE, THYROIDAB in the last 72 hours. Anemia Panel: No results for input(s): VITAMINB12, FOLATE, FERRITIN, TIBC, IRON, RETICCTPCT in the last 72 hours. Sepsis Labs: No results for input(s): PROCALCITON, LATICACIDVEN in the last 168 hours.  No results found for this or any previous visit (from the past 240 hour(s)).       Radiology Studies: No results found.      Scheduled Meds: . chlorhexidine  15 mL Mouth Rinse BID  . Chlorhexidine Gluconate Cloth  6 each Topical Daily  . diltiazem  30 mg Per Tube Q6H  . feeding supplement (ENSURE ENLIVE)  237 mL Oral BID BM  . feeding supplement (PRO-STAT SUGAR FREE 64)  30 mL Oral Daily  .  flecainide  50 mg Oral Q12H  . furosemide  160 mg Oral TID  . gabapentin  300 mg Oral BID  . insulin aspart  0-9 Units Subcutaneous TID WC  . mouth rinse  15 mL Mouth Rinse q12n4p  . metoprolol tartrate  2.5 mg Intravenous Once  . multivitamin  1 tablet Oral QHS  . pantoprazole  40 mg Oral Daily  . sevelamer carbonate  800 mg Oral TID WC  . sodium chloride flush  3 mL Intravenous Q12H   Continuous Infusions: . sodium chloride Stopped (09/28/18 0600)     LOS: 22 days     Cordelia Poche, MD [facility] 09/30/2018, 8:13 AM  If 7PM-7AM, please contact night-coverage [email or web address]

## 2018-10-01 LAB — MAGNESIUM: Magnesium: 1.6 mg/dL — ABNORMAL LOW (ref 1.7–2.4)

## 2018-10-01 LAB — GLUCOSE, CAPILLARY
Glucose-Capillary: 118 mg/dL — ABNORMAL HIGH (ref 70–99)
Glucose-Capillary: 182 mg/dL — ABNORMAL HIGH (ref 70–99)
Glucose-Capillary: 145 mg/dL — ABNORMAL HIGH (ref 70–99)
Glucose-Capillary: 141 mg/dL — ABNORMAL HIGH (ref 70–99)

## 2018-10-01 LAB — CBC
HCT: 30.7 % — ABNORMAL LOW (ref 39.0–52.0)
Hemoglobin: 10 g/dL — ABNORMAL LOW (ref 13.0–17.0)
MCH: 32.5 pg (ref 26.0–34.0)
MCHC: 32.6 g/dL (ref 30.0–36.0)
MCV: 99.7 fL (ref 80.0–100.0)
Platelets: 52 10*3/uL — ABNORMAL LOW (ref 150–400)
RBC: 3.08 MIL/uL — ABNORMAL LOW (ref 4.22–5.81)
RDW: 19.7 % — ABNORMAL HIGH (ref 11.5–15.5)
WBC: 8.8 10*3/uL (ref 4.0–10.5)
nRBC: 0 % (ref 0.0–0.2)

## 2018-10-01 LAB — RENAL FUNCTION PANEL
Albumin: 2.7 g/dL — ABNORMAL LOW (ref 3.5–5.0)
Anion gap: 15 (ref 5–15)
BUN: 113 mg/dL — ABNORMAL HIGH (ref 8–23)
CO2: 35 mmol/L — ABNORMAL HIGH (ref 22–32)
Calcium: 8.1 mg/dL — ABNORMAL LOW (ref 8.9–10.3)
Chloride: 86 mmol/L — ABNORMAL LOW (ref 98–111)
Creatinine, Ser: 4.5 mg/dL — ABNORMAL HIGH (ref 0.61–1.24)
GFR calc Af Amer: 15 mL/min — ABNORMAL LOW (ref >60)
GFR calc non Af Amer: 13 mL/min — ABNORMAL LOW (ref >60)
Glucose, Bld: 130 mg/dL — ABNORMAL HIGH (ref 70–99)
Phosphorus: 7.1 mg/dL — ABNORMAL HIGH (ref 2.5–4.6)
Potassium: 2.9 mmol/L — ABNORMAL LOW (ref 3.5–5.1)
Sodium: 136 mmol/L (ref 135–145)

## 2018-10-01 LAB — LACTATE DEHYDROGENASE: LDH: 347 U/L — ABNORMAL HIGH (ref 98–192)

## 2018-10-01 MED ORDER — POTASSIUM CHLORIDE CRYS ER 20 MEQ PO TBCR
40.0000 meq | EXTENDED_RELEASE_TABLET | Freq: Once | ORAL | Status: AC
  Administered 2018-10-01: 40 meq via ORAL
  Filled 2018-10-01: qty 2

## 2018-10-01 MED ORDER — POTASSIUM CHLORIDE CRYS ER 20 MEQ PO TBCR
40.0000 meq | EXTENDED_RELEASE_TABLET | Freq: Every day | ORAL | Status: DC
  Administered 2018-10-01 – 2018-10-03 (×3): 40 meq via ORAL
  Filled 2018-10-01 (×3): qty 2

## 2018-10-01 MED ORDER — MAGNESIUM SULFATE 2 GM/50ML IV SOLN
2.0000 g | Freq: Once | INTRAVENOUS | Status: AC
  Administered 2018-10-01: 2 g via INTRAVENOUS

## 2018-10-01 MED ORDER — MAGNESIUM SULFATE 2 GM/50ML IV SOLN
INTRAVENOUS | Status: AC
  Administered 2018-10-01: 2 g via INTRAVENOUS
  Filled 2018-10-01: qty 50

## 2018-10-01 MED ORDER — POLYETHYLENE GLYCOL 3350 17 G PO PACK
17.0000 g | PACK | Freq: Every day | ORAL | Status: DC
  Administered 2018-10-01 – 2018-10-14 (×11): 17 g via ORAL
  Filled 2018-10-01 (×13): qty 1

## 2018-10-01 NOTE — Progress Notes (Addendum)
[facility] PROGRESS NOTE  [name] Rose ZSW:[id number] DOB: 05-15-1950 DOA: 09/08/2018 PCP: Shirline Frees, MD   Narrative: 68 year old Caucasian male Prostate cancer in 2008 s/p prostatectomy--neg PSA per PCP metastatic bladder cancer (follows with [name]) on chemo permanent A. fib chads score ~3-no anticoagulation [diag mar 2020] secondary to severe thrombocytopenia Prior MSSA bacteremia HTN Prediabetes Admitted 09/08/2018 with probable viral pneumonia-coronavirus 192 negative in addition to hypotension and AKI creatinine up to 2.3 from 1.7 baseline  A & Plan Pneumonia? Viral Supportive management at this time Abx from 5/29-->6/5 BAL from 6/2 nad Persistent A. 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 CRI Raquel James, MD  [facility] Via [employer] app OR [email or web address] 7PM-7AM contact night coverage as above 10/01/2018, 3:26 PM  LOS: 23 days   Consultants:  nephrology  Procedures:    Antimicrobials:  As above  Interval history/Subjective:  Well  No issues No fever  No sob, no cp  Objective:  Vitals:  Vitals:   10/01/18 1047 10/01/18 1500  BP: 119/85 128/84  Pulse: 93 90  Resp: 19 19  Temp: 97.7  F (36.5 C) 98 F (36.7 C)  SpO2: 94% 97%    Exam:  eomi ncat  chest clear no added sounded abd soft nt nd no rebound No le edema no   I have personally reviewed the following:  DATA   Labs:  WBC down form 10.7-->8.8  Hemoglobin stable 10.0  PLT 52  K2.9, BUN/CREAT 113/4.0  Mag 1.6  LDH 347  Scheduled Meds: . chlorhexidine  15 mL Mouth Rinse BID  . Chlorhexidine Gluconate Cloth  6 each Topical Daily  . diltiazem  30 mg Per Tube Q6H  . feeding supplement (ENSURE ENLIVE)  237 mL Oral BID BM  . feeding supplement (PRO-STAT SUGAR FREE 64)  30 mL Oral Daily  . gabapentin  300 mg Oral BID  . insulin aspart  0-9 Units Subcutaneous TID WC  . mouth rinse  15 mL Mouth Rinse q12n4p  . metoprolol tartrate  2.5 mg Intravenous Once  . multivitamin  1 tablet Oral QHS  . pantoprazole  40 mg Oral Daily  . polyethylene glycol  17 g Oral Daily  . potassium chloride  40 mEq Oral Daily  . sevelamer carbonate  800 mg Oral TID WC  . sodium chloride flush  3 mL Intravenous Q12H   Continuous Infusions: . sodium chloride Stopped (09/28/18 0600)  . magnesium sulfate bolus IVPB      Principal Problem:   Pneumonia Active Problems:   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 (HCC)   LFT elevation   AKI (acute kidney injury) ([place])   CKD (chronic kidney disease), stage III (HCC)   Acute respiratory failure with hypoxia (HCC)   Acute renal failure (HCC)   Acute renal  failure with acute tubular necrosis superimposed on stage 3 chronic kidney disease ([place])   Metastatic cancer ([place])   Palliative care encounter   Chronic atrial fibrillation   LOS: 23 days

## 2018-10-01 NOTE — Progress Notes (Signed)
[facility]    NEPHROLOGY PROGRESS NOTE  SUBJECTIVE: Patient seen and examined.  No acute complaints.  Denies chest pain, shortness of breath, nausea, vomiting, diarrhea or dysuria.  All other review of systems are negative.  Eating lunch on examination.   OBJECTIVE:  Vitals:   10/01/18 0739 10/01/18 1047  BP: 120/90 119/85  Pulse:  93  Resp:  19  Temp: 97.7 F (36.5 C) 97.7 F (36.5 C)  SpO2:  94%    Intake/Output Summary (Last 24 hours) at 10/01/2018 1327 Last data filed at 10/01/2018 1239 Gross per 24 hour  Intake 720 ml  Output 3050 ml  Net -2330 ml      General:  AAOx3 NAD HEENT: MMM [place] AT anicteric sclera Neck:  No JVD, no adenopathy CV:  Heart RRR  Lungs:  L/S fine bibasilar rales Abd:  abd SNT/ND with normal BS GU:  Bladder non-palpable Extremities: +1 bilateral LE edema. Skin:  No skin rash  MEDICATIONS:  . chlorhexidine  15 mL Mouth Rinse BID  . Chlorhexidine Gluconate Cloth  6 each Topical Daily  . diltiazem  30 mg Per Tube Q6H  . feeding supplement (ENSURE ENLIVE)  237 mL Oral BID BM  . feeding supplement (PRO-STAT SUGAR FREE 64)  30 mL Oral Daily  . flecainide  50 mg Oral Q12H  . gabapentin  300 mg Oral BID  . insulin aspart  0-9 Units Subcutaneous TID WC  . mouth rinse  15 mL Mouth Rinse q12n4p  . metoprolol tartrate  2.5 mg Intravenous Once  . multivitamin  1 tablet Oral QHS  . pantoprazole  40 mg Oral Daily  . potassium chloride  40 mEq Oral Daily  . sevelamer carbonate  800 mg Oral TID WC  . sodium chloride flush  3 mL Intravenous Q12H       LABS:   CBC Latest Ref Rng & Units 10/01/2018 09/30/2018 09/29/2018  WBC 4.0 - 10.5 K/uL 8.8 10.7(H) 9.5  Hemoglobin 13.0 - 17.0 g/dL 10.0(L) 10.2(L) 9.5(L)  Hematocrit 39.0 - 52.0 % 30.7(L) 31.2(L) 28.9(L)  Platelets 150 - 400 K/uL 52(L) 49(L) 39(L)    CMP Latest Ref Rng & Units 10/01/2018 09/30/2018 09/29/2018  Glucose 70 - 99 mg/dL 130(H) 137(H) 136(H)  BUN 8 - 23 mg/dL 113(H)  104(H) 100(H)  Creatinine 0.61 - 1.24 mg/dL 4.50(H) 4.31(H) 4.08(H)  Sodium 135 - 145 mmol/L 136 136 137  Potassium 3.5 - 5.1 mmol/L 2.9(L) 3.3(L) 4.0  Chloride 98 - 111 mmol/L 86(L) 88(L) 93(L)  CO2 22 - 32 mmol/L 35(H) 33(H) 30  Calcium 8.9 - 10.3 mg/dL 8.1(L) 8.5(L) 8.3(L)  Total Protein 6.5 - 8.1 g/dL - - 4.8(L)  Total Bilirubin 0.3 - 1.2 mg/dL - - 0.8  Alkaline Phos 38 - 126 U/L - - 59  AST 15 - 41 U/L - - 42(H)  ALT 0 - 44 U/L - - 43    Lab Results  Component Value Date   PTH 96 (H) 09/25/2018   CALCIUM 8.1 (L) 10/01/2018   CAION 1.19 09/20/2016   PHOS 7.1 (H) 10/01/2018       Component Value Date/Time   COLORURINE YELLOW 09/18/2018 1302   APPEARANCEUR CLOUDY (A) 09/18/2018 1302   LABSPEC 1.011 09/18/2018 1302   PHURINE 5.0 09/18/2018 1302   GLUCOSEU >=500 (A) 09/18/2018 1302   HGBUR MODERATE (A) 09/18/2018 1302   BILIRUBINUR NEGATIVE 09/18/2018 [place] 09/18/2018 1302   PROTEINUR NEGATIVE 09/18/2018 1302   UROBILINOGEN  0.2 01/31/2010 0624   NITRITE NEGATIVE 09/18/2018 1302   LEUKOCYTESUR NEGATIVE 09/18/2018 1302      Component Value Date/Time   PHART 7.255 (L) 09/17/2018 0317   PCO2ART 39.0 09/17/2018 0317   PO2ART 66.3 (L) 09/17/2018 0317   HCO3 16.7 (L) 09/17/2018 0317   TCO2 29 09/20/2016 0657   ACIDBASEDEF 9.2 (H) 09/17/2018 0317   O2SAT 90.7 09/17/2018 0317       Component Value Date/Time   IRON 88 07/24/2018 1230   TIBC 240 07/24/2018 1230   FERRITIN 3,307 (H) 09/08/2018 1047   IRONPCTSAT 37 07/24/2018 1230       ASSESSMENT/PLAN:     68 year old male patient who presented with respiratory failure and fever secondary to presumed pneumonia.  He was started on empiric antibiotics.  COVID-19 was negative.  His respiratory status worsened requiring mechanical ventilation and CRRT for acute renal failure.  He is currently on intermittent hemodialysis as needed.  1.  Chronic kidney disease stage III.  His baseline serum creatinine runs  around 1.4-1.5.  I suspect his chronic kidney disease on the basis of longstanding diabetes.  His urine is negative for protein.  Renal ultrasound from 09/18/2018 was negative for hydronephrosis.  PTH 96 from 09/25/2018.    2.  Acute kidney injury.  Appears to have some improvement in renal function.  While creatinine is increasing, urine output is excellent.  We will continue to monitor off of dialysis.  Will hold lasix - is developing a significant metabolic alkalosis.  Will correct K.  3.  Secondary hyperparathyroidism.  We will add a phosphate binder for hyperphosphatemia.  4.  Anemia.  Hemoglobin is stable  5.  Pneumonia.  Respiratory status is improving.     [place], DO, [place]

## 2018-10-01 NOTE — Plan of Care (Signed)
  Problem: Education: Goal: Knowledge of General Education information will improve Description: Including pain rating scale, medication(s)/side effects and non-pharmacologic comfort measures Outcome: Progressing   Problem: Health Behavior/Discharge Planning: Goal: Ability to manage health-related needs will improve Outcome: Progressing   Problem: Clinical Measurements: Goal: Ability to maintain clinical measurements within normal limits will improve Outcome: Progressing Goal: Will remain free from infection Outcome: Progressing Goal: Diagnostic test results will improve Outcome: Progressing Goal: Respiratory complications will improve Outcome: Progressing Goal: Cardiovascular complication will be avoided Outcome: Progressing   Problem: Activity: Goal: Risk for activity intolerance will decrease Outcome: Progressing   Problem: Nutrition: Goal: Adequate nutrition will be maintained Outcome: Progressing   Problem: Coping: Goal: Level of anxiety will decrease Outcome: Progressing   Problem: Elimination: Goal: Will not experience complications related to bowel motility Outcome: Progressing Goal: Will not experience complications related to urinary retention Outcome: Progressing   Problem: Safety: Goal: Ability to remain free from injury will improve Outcome: Progressing   Problem: Skin Integrity: Goal: Risk for impaired skin integrity will decrease Outcome: Progressing   Problem: Activity: Goal: Ability to tolerate increased activity will improve Outcome: Progressing   Problem: Clinical Measurements: Goal: Ability to maintain a body temperature in the normal range will improve Outcome: Progressing   Problem: Respiratory: Goal: Ability to maintain adequate ventilation will improve Outcome: Progressing Goal: Ability to maintain a clear airway will improve Outcome: Progressing

## 2018-10-02 ENCOUNTER — Ambulatory Visit: Payer: PPO | Admitting: Family

## 2018-10-02 ENCOUNTER — Other Ambulatory Visit: Payer: PPO

## 2018-10-02 ENCOUNTER — Ambulatory Visit: Payer: PPO

## 2018-10-02 DIAGNOSIS — E213 Hyperparathyroidism, unspecified: Secondary | ICD-10-CM

## 2018-10-02 DIAGNOSIS — D631 Anemia in chronic kidney disease: Secondary | ICD-10-CM

## 2018-10-02 LAB — GLUCOSE, CAPILLARY
Glucose-Capillary: 121 mg/dL — ABNORMAL HIGH (ref 70–99)
Glucose-Capillary: 153 mg/dL — ABNORMAL HIGH (ref 70–99)
Glucose-Capillary: 134 mg/dL — ABNORMAL HIGH (ref 70–99)
Glucose-Capillary: 185 mg/dL — ABNORMAL HIGH (ref 70–99)

## 2018-10-02 LAB — RENAL FUNCTION PANEL
Albumin: 2.8 g/dL — ABNORMAL LOW (ref 3.5–5.0)
Anion gap: 15 (ref 5–15)
BUN: 115 mg/dL — ABNORMAL HIGH (ref 8–23)
CO2: 33 mmol/L — ABNORMAL HIGH (ref 22–32)
Calcium: 8.6 mg/dL — ABNORMAL LOW (ref 8.9–10.3)
Chloride: 87 mmol/L — ABNORMAL LOW (ref 98–111)
Creatinine, Ser: 4.29 mg/dL — ABNORMAL HIGH (ref 0.61–1.24)
GFR calc Af Amer: 15 mL/min — ABNORMAL LOW (ref >60)
GFR calc non Af Amer: 13 mL/min — ABNORMAL LOW (ref >60)
Glucose, Bld: 131 mg/dL — ABNORMAL HIGH (ref 70–99)
Phosphorus: 6.5 mg/dL — ABNORMAL HIGH (ref 2.5–4.6)
Potassium: 3.8 mmol/L (ref 3.5–5.1)
Sodium: 135 mmol/L (ref 135–145)

## 2018-10-02 LAB — CBC WITH DIFFERENTIAL/PLATELET
Abs Immature Granulocytes: 0.04 10*3/uL (ref 0.00–0.07)
Basophils Absolute: 0 10*3/uL (ref 0.0–0.1)
Basophils Relative: 0 %
Eosinophils Absolute: 0.3 10*3/uL (ref 0.0–0.5)
Eosinophils Relative: 3 %
HCT: 30.4 % — ABNORMAL LOW (ref 39.0–52.0)
Hemoglobin: 9.9 g/dL — ABNORMAL LOW (ref 13.0–17.0)
Immature Granulocytes: 0 %
Lymphocytes Relative: 9 %
Lymphs Abs: 0.8 10*3/uL (ref 0.7–4.0)
MCH: 32.5 pg (ref 26.0–34.0)
MCHC: 32.6 g/dL (ref 30.0–36.0)
MCV: 99.7 fL (ref 80.0–100.0)
Monocytes Absolute: 0.6 10*3/uL (ref 0.1–1.0)
Monocytes Relative: 7 %
Neutro Abs: 7.3 10*3/uL (ref 1.7–7.7)
Neutrophils Relative %: 81 %
Platelets: 56 10*3/uL — ABNORMAL LOW (ref 150–400)
RBC: 3.05 MIL/uL — ABNORMAL LOW (ref 4.22–5.81)
RDW: 19.4 % — ABNORMAL HIGH (ref 11.5–15.5)
WBC: 9.1 10*3/uL (ref 4.0–10.5)
nRBC: 0 % (ref 0.0–0.2)

## 2018-10-02 LAB — MAGNESIUM: Magnesium: 2.2 mg/dL (ref 1.7–2.4)

## 2018-10-02 LAB — LACTATE DEHYDROGENASE: LDH: 338 U/L — ABNORMAL HIGH (ref 98–192)

## 2018-10-02 MED ORDER — SORBITOL 70 % SOLN
20.0000 mL | Freq: Every day | Status: DC
  Administered 2018-10-02 – 2018-10-14 (×13): 20 mL via ORAL
  Filled 2018-10-02 (×14): qty 30

## 2018-10-02 NOTE — Progress Notes (Signed)
[name] Rose seems to be doing a little better.  Unfortunately, his renal function has not doing better.  His BUN is 115 and creatinine 4.3.  I am sure that he will need dialysis again.  The LDH is coming down quite nicely.  It is 338.  Of note, he had normal liver function studies back on 19 June.  His platelet count is improving slowly.  It is 56,000 today.  I think he did get a dose of Nplate last week.  His hemoglobin is 9.9.  White blood cell count is 9.1.  He says he got out of bed over the weekend.  He said it was quite tiring.  I am sure he is incredibly weak.  He has been hospitalized now for at least 3 weeks.  He really has not been out of bed during this time.  His appetite seems be doing pretty well.  He has had no nausea or vomiting.  There is no diarrhea.  He is making some urine.  He has had no fever.  There is been no bleeding.  His vital signs all look pretty stable.  His temperature is 97.7.  Pulse 92.  Blood pressure 126/91.  His head neck exam shows no ocular or oral lesions.  He has no thrush.  There is no adenopathy on the neck.  Lungs are relatively clear bilaterally.  Cardiac exam regular rate and rhythm.  Abdomen is soft.  Bowel sounds are present.  There is no guarding or rebound tenderness.  Extremities shows no clubbing, cyanosis or edema.  Neurological exam is nonfocal.  I think the issue now is whether or not the renal function will ever improve.  Again, I would think he is going to need dialysis soon.  I am glad that his platelets are improving.  The LDH is coming down.  Hopefully this trend will continue.  I am still not sure as to what plans are for him to have rehabilitation.  It is obvious that he is getting wonderful care by everybody in the stepdown unit.  [name], MD  [place] 11:1

## 2018-10-02 NOTE — Progress Notes (Signed)
Physical Therapy Treatment Patient Details Name: [name] Rose MRN: [id number] DOB: 09-16-1950 Today's Date: 10/02/2018    History of Present Illness Pt is a 68 y/o male presenting with resiratory failure and fever secondary to presumed pneumonia.  Intubated 6/05/18/08, CRRT while in ICU.  Course complicated by anemia and atrial fib with RVR. PMH: prostate cancer with bladder mets, chemo induced mucositits, CKD, HTN, hyperglycemia.     PT Comments    Pt admitted with above diagnosis. Pt currently with functional limitations due to balance and endurance deficits. Pt was able to scoot to chair with arm dropped with use of pad and mod to max assist of 2.  Pt tolerated sittig better than last visit able to sit with min guard assist.  Fatigues quickly and needed O2 1L to 5L with activity to maintain sats.  Had to leave pt on 2L O2 as he was desaturating with eating once in chair.  Nurse made aware of this and that HR higher and sustained higher after working with PT/OT.  Overall pt improved today.   Pt will benefit from skilled PT to increase their independence and safety with mobility to allow discharge to the venue listed below.     Follow Up Recommendations  CIR     Equipment Recommendations  Other (comment)(TBD)    Recommendations for Other Services Rehab consult     Precautions / Restrictions Precautions Precautions: Fall Restrictions Weight Bearing Restrictions: No    Mobility  Bed Mobility Overal bed mobility: Needs Assistance Bed Mobility: Supine to Sit     Supine to sit: HOB elevated;+2 for physical assistance;Mod assist Sit to supine: Total assist;+2 for physical assistance   General bed mobility comments: overall mod assist for B LE and trunk support, hand over hand support to reach and pull with L UE   Transfers Overall transfer level: Needs assistance   Transfers: Lateral/Scoot Transfers          Lateral/Scoot Transfers: Max assist;+2 physical assistance;+2  safety/equipment General transfer comment: using bed pad, lateral scoot towards R side with max +2, pt supporting with foward lean   Ambulation/Gait             General Gait Details: unable   Stairs             Wheelchair Mobility    Modified Rankin (Stroke Patients Only)       Balance Overall balance assessment: Needs assistance Sitting-balance support: Bilateral upper extremity supported;Feet supported Sitting balance-Leahy Scale: Fair Sitting balance - Comments: min guard sitting EOB, preference to B UE support; static sitting only                                    Cognition Arousal/Alertness: Awake/alert Behavior During Therapy: WFL for tasks assessed/performed Overall Cognitive Status: Impaired/Different from baseline Area of Impairment: Problem solving                             Problem Solving: Slow processing;Decreased initiation;Requires verbal cues;Difficulty sequencing        Exercises General Exercises - Lower Extremity Ankle Circles/Pumps: AROM;Both;10 reps;Supine Quad Sets: AROM;Both;10 reps;Supine Long Arc Quad: AROM;Both;10 reps;Seated Heel Slides: AAROM;Both;10 reps;Supine    General Comments General comments (skin integrity, edema, etc.): noted on 1L [place] upon entry, increased to 5L to sustain levels during mobility, decreased to 2L at end of session with saturations  in low 90s; HR initally 97, increased into 115-120 during mobility and sitting EOB- RN present and aware      Pertinent Vitals/Pain Pain Assessment: Faces Faces Pain Scale: Hurts a little bit Pain Location: generalized Pain Descriptors / Indicators: Aching;Grimacing;Guarding Pain Intervention(s): Limited activity within patient's tolerance;Monitored during session;Repositioned    Home Living                      Prior Function            PT Goals (current goals can now be found in the care plan section) Acute Rehab PT  Goals Patient Stated Goal: to get stronger Progress towards PT goals: Progressing toward goals    Frequency    Min 3X/week      PT Plan Current plan remains appropriate    Co-evaluation PT/OT/SLP Co-Evaluation/Treatment: Yes Reason for Co-Treatment: Complexity of the patient's impairments (multi-system involvement);For patient/therapist safety PT goals addressed during session: Mobility/safety with mobility OT goals addressed during session: ADL's and self-care      AM-PAC PT "6 Clicks" Mobility   Outcome Measure  Help needed turning from your back to your side while in a flat bed without using bedrails?: A Lot Help needed moving from lying on your back to sitting on the side of a flat bed without using bedrails?: A Lot Help needed moving to and from a bed to a chair (including a wheelchair)?: Total Help needed standing up from a chair using your arms (e.g., wheelchair or bedside chair)?: Total Help needed to walk in hospital room?: Total Help needed climbing 3-5 steps with a railing? : Total 6 Click Score: 8    End of Session Equipment Utilized During Treatment: Oxygen;Gait belt Activity Tolerance: Patient limited by fatigue(limited by incr HR) Patient left: with call bell/phone within reach;in chair;with chair alarm set Nurse Communication: Mobility status;Need for lift equipment(nurse asked to MAxi move OOB later today) PT Visit Diagnosis: Unsteadiness on feet (R26.81);Other abnormalities of gait and mobility (R26.89);Muscle weakness (generalized) (M62.81)     Time: 7076-1518 PT Time Calculation (min) (ACUTE ONLY): 24 min  Charges:  $Therapeutic Activity: 8-22 mins                     Taite Schoeppner,PT Acute Rehabilitation Services Pager:  [phone]  Office:  [place] 10/02/2018, 1:42 PM

## 2018-10-02 NOTE — Progress Notes (Signed)
[facility] PROGRESS NOTE  BEVIN MAYALL RJJ:[id number] DOB: 01-09-1951 DOA: 09/08/2018 PCP: Shirline Frees, MD   Narrative: 68 year old Caucasian male Prostate cancer in 2008 s/p prostatectomy--neg PSA per PCP metastatic bladder cancer (follows with [name]) on chemo permanent A. fib [place] 3 since 06/2018- no AC 2/2 severe thrombocytopenia Prior MSSA bacteremia HTN Prediabetes Admitted 09/08/2018 with probable viral pneumonia-coronavirus 192 negative in addition to hypotension and AKI creatinine up to 2.3 from 1.7 baseline  A & Plan Pneumonia? Viral Supportive management at this time Abx from 5/29-->6/5 BAL from 6/2 nad Persistent A. Fib Prior attempt with Flecainide-currently rate controlled cardizem 30 q6 No AC 2/2 TCP Metastatic bladder and prostate cancer TCP Nplate per Oncology--plt have been stable Rec'd in past Padcev x 3 cycles Low grade myelodysplasia on Path--? 2/2 chemo DM ty ii  Cont SSI, CBG 118--->180's AKI Hypomagnesemia, hypokalemia, metabolic alkalosis from diuresis Received CRRT, iHD per nephro at this time Mag, K approp CO2 slightly ? from prior -15.7 liters MSSA bacteremia 06/2018 immunocompromised 2/2 cancer--no current concerns HTN Oropharyngeal dysphagia Currently dys 3 diet Constipation Cont Miralax and Senna--stop dulcolax + sorbitol today as no stool BMI 28  DVT none  Code Status: full  Communication:   Disposition Plan: sdu today--->Tele am? CIR eventually    Asbury Hair, MD  [facility] Via [employer] app OR [email or web address] 7PM-7AM contact night coverage as above 10/02/2018, 11:27 AM  LOS: 24 days   Consultants:  nephrology  Procedures:    Antimicrobials:  As above  Interval history/Subjective:  Awake coherent to some extent Cannot remember details  No fever no chill no n/v Sat in chair for 3 hrs  Objective:  Vitals:  Vitals:   10/02/18 0749 10/02/18 1052  BP: 134/85 (!) 118/92  Pulse: 97 95  Resp: 18 20   Temp: 97.6 F (36.4 C) 97.8 F (36.6 C)  SpO2: 97% 95%    Exam:  eomi ncat in nad cta b no added sound abd soft nt nd no rebound no guard Mild LE edema Neuro intact LIJ noted  I have personally reviewed the following:  DATA   Labs:  WBC down form 10.7-->8.8-->9.1  Hemoglobin stable 9.9  PLT 56  K2.9-->3.8  BUN/CREAT 113/4.0----->115/4.29  Mag 1.6-->2.2, phos 7.1--->6.5  Co2 35--->33  LDH 347  Scheduled Meds: . chlorhexidine  15 mL Mouth Rinse BID  . Chlorhexidine Gluconate Cloth  6 each Topical Daily  . diltiazem  30 mg Per Tube Q6H  . feeding supplement (ENSURE ENLIVE)  237 mL Oral BID BM  . feeding supplement (PRO-STAT SUGAR FREE 64)  30 mL Oral Daily  . gabapentin  300 mg Oral BID  . insulin aspart  0-9 Units Subcutaneous TID WC  . mouth rinse  15 mL Mouth Rinse q12n4p  . metoprolol tartrate  2.5 mg Intravenous Once  . multivitamin  1 tablet Oral QHS  . pantoprazole  40 mg Oral Daily  . polyethylene glycol  17 g Oral Daily  . potassium chloride  40 mEq Oral Daily  . sevelamer carbonate  800 mg Oral TID WC  . sodium chloride flush  3 mL Intravenous Q12H  . sorbitol  20 mL Oral Daily   Continuous Infusions: . sodium chloride Stopped (09/28/18 0600)    Principal Problem:   Pneumonia Active Problems:   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 (HCC)   LFT elevation   AKI (acute kidney injury) ([place])   CKD (chronic kidney disease), stage III (HCC)   Acute  respiratory failure with hypoxia (HCC)   Acute renal failure (HCC)   Acute renal failure with acute tubular necrosis superimposed on stage 3 chronic kidney disease ([place])   Metastatic cancer ([place])   Palliative care encounter   Chronic atrial fibrillation   LOS: 24 days

## 2018-10-02 NOTE — Progress Notes (Signed)
[facility]    NEPHROLOGY PROGRESS NOTE  SUBJECTIVE: Patient seen and examined.  No acute complaints.  Denies chest pain, shortness of breath, nausea, vomiting, diarrhea or dysuria.  All other review of systems are negative.  Wants to get out of bed.  OBJECTIVE:  Vitals:   10/02/18 0749 10/02/18 1052  BP: 134/85 (!) 118/92  Pulse: 97 95  Resp: 18 20  Temp: 97.6 F (36.4 C) 97.8 F (36.6 C)  SpO2: 97% 95%    Intake/Output Summary (Last 24 hours) at 10/02/2018 1259 Last data filed at 10/02/2018 0750 Gross per 24 hour  Intake 480 ml  Output 600 ml  Net -120 ml      General:  AAOx3 NAD HEENT: MMM [place] AT anicteric sclera Neck:  No JVD, no adenopathy CV:  Heart RRR  Lungs:  L/S fine bibasilar rales Abd:  abd SNT/ND with normal BS GU:  Bladder non-palpable Extremities: +1 bilateral LE edema. Skin:  No skin rash  MEDICATIONS:  . chlorhexidine  15 mL Mouth Rinse BID  . Chlorhexidine Gluconate Cloth  6 each Topical Daily  . diltiazem  30 mg Per Tube Q6H  . feeding supplement (ENSURE ENLIVE)  237 mL Oral BID BM  . feeding supplement (PRO-STAT SUGAR FREE 64)  30 mL Oral Daily  . gabapentin  300 mg Oral BID  . insulin aspart  0-9 Units Subcutaneous TID WC  . mouth rinse  15 mL Mouth Rinse q12n4p  . metoprolol tartrate  2.5 mg Intravenous Once  . multivitamin  1 tablet Oral QHS  . pantoprazole  40 mg Oral Daily  . polyethylene glycol  17 g Oral Daily  . potassium chloride  40 mEq Oral Daily  . sevelamer carbonate  800 mg Oral TID WC  . sodium chloride flush  3 mL Intravenous Q12H       LABS:   CBC Latest Ref Rng & Units 10/02/2018 10/01/2018 09/30/2018  WBC 4.0 - 10.5 K/uL 9.1 8.8 10.7(H)  Hemoglobin 13.0 - 17.0 g/dL 9.9(L) 10.0(L) 10.2(L)  Hematocrit 39.0 - 52.0 % 30.4(L) 30.7(L) 31.2(L)  Platelets 150 - 400 K/uL 56(L) 52(L) 49(L)    CMP Latest Ref Rng & Units 10/02/2018 10/01/2018 09/30/2018  Glucose 70 - 99 mg/dL 131(H) 130(H) 137(H)  BUN 8 - 23 mg/dL  115(H) 113(H) 104(H)  Creatinine 0.61 - 1.24 mg/dL 4.29(H) 4.50(H) 4.31(H)  Sodium 135 - 145 mmol/L 135 136 136  Potassium 3.5 - 5.1 mmol/L 3.8 2.9(L) 3.3(L)  Chloride 98 - 111 mmol/L 87(L) 86(L) 88(L)  CO2 22 - 32 mmol/L 33(H) 35(H) 33(H)  Calcium 8.9 - 10.3 mg/dL 8.6(L) 8.1(L) 8.5(L)  Total Protein 6.5 - 8.1 g/dL - - -  Total Bilirubin 0.3 - 1.2 mg/dL - - -  Alkaline Phos 38 - 126 U/L - - -  AST 15 - 41 U/L - - -  ALT 0 - 44 U/L - - -    Lab Results  Component Value Date   PTH 96 (H) 09/25/2018   CALCIUM 8.6 (L) 10/02/2018   CAION 1.19 09/20/2016   PHOS 6.5 (H) 10/02/2018       Component Value Date/Time   COLORURINE YELLOW 09/18/2018 1302   APPEARANCEUR CLOUDY (A) 09/18/2018 1302   LABSPEC 1.011 09/18/2018 1302   PHURINE 5.0 09/18/2018 1302   GLUCOSEU >=500 (A) 09/18/2018 1302   HGBUR MODERATE (A) 09/18/2018 1302   BILIRUBINUR NEGATIVE 09/18/2018 [place] 09/18/2018 1302   PROTEINUR NEGATIVE 09/18/2018 1302  UROBILINOGEN 0.2 01/31/2010 0624   NITRITE NEGATIVE 09/18/2018 1302   LEUKOCYTESUR NEGATIVE 09/18/2018 1302      Component Value Date/Time   PHART 7.255 (L) 09/17/2018 0317   PCO2ART 39.0 09/17/2018 0317   PO2ART 66.3 (L) 09/17/2018 0317   HCO3 16.7 (L) 09/17/2018 0317   TCO2 29 09/20/2016 0657   ACIDBASEDEF 9.2 (H) 09/17/2018 0317   O2SAT 90.7 09/17/2018 0317       Component Value Date/Time   IRON 88 07/24/2018 1230   TIBC 240 07/24/2018 1230   FERRITIN 3,307 (H) 09/08/2018 1047   IRONPCTSAT 37 07/24/2018 1230       ASSESSMENT/PLAN:     68 year old male patient who presented with respiratory failure and fever secondary to presumed pneumonia.  He was started on empiric antibiotics.  COVID-19 was negative.  His respiratory status worsened requiring mechanical ventilation and CRRT for acute renal failure.  He is currently on intermittent hemodialysis as needed.  1.  Chronic kidney disease stage III.  His baseline serum creatinine runs  around 1.4-1.5.  I suspect his chronic kidney disease on the basis of longstanding diabetes.  His urine is negative for protein.  Renal ultrasound from 09/18/2018 was negative for hydronephrosis.  PTH 96 from 09/25/2018.    2.  Acute kidney injury.  Appears to have some improvement in renal function.  Creatinine improved slightly today with DC of furosemide.  UOP has been excellent.  We will continue to monitor off of dialysis.  Will hold lasix - is developing a significant metabolic alkalosis.    3.  Secondary hyperparathyroidism.  We will continue a phosphate binder for hyperphosphatemia.  4.  Anemia.  Hemoglobin is stable  5.  Pneumonia.  Respiratory status is improving.     [place], DO, [place]

## 2018-10-02 NOTE — Progress Notes (Signed)
Occupational Therapy Treatment Patient Details Name: [name] Rose MRN: [id number] DOB: 1950-05-26 Today's Date: 10/02/2018    History of present illness Pt is a 68 y/o male presenting with resiratory failure and fever secondary to presumed pneumonia.  Intubated 6/05/18/08, CRRT while in ICU.  Course complicated by anemia and atrial fib with RVR. PMH: prostate cancer with bladder mets, chemo induced mucositits, CKD, HTN, hyperglycemia.    OT comments  Patient progressing slowly.  Continues to be limited by decreased activity tolerance, generalized weakness and impaired balance. Progressed OOB to chair today, max assist +2 lateral scoot to recliner.  Setup assist for grooming and eating in recliner. Noted HR increased from mid 90s to 115-120, sitting EOB and in recliner; SPO2 on 1 L supplemental oxgyen via [place] upon entry increased to 5L during mobility and remains on 2L in recliner seated--RN present and aware.  Will follow.    Follow Up Recommendations  CIR;Supervision/Assistance - 24 hour    Equipment Recommendations  3 in 1 bedside commode    Recommendations for Other Services Rehab consult    Precautions / Restrictions Precautions Precautions: Fall       Mobility Bed Mobility Overal bed mobility: Needs Assistance Bed Mobility: Supine to Sit     Supine to sit: HOB elevated;+2 for physical assistance;Mod assist     General bed mobility comments: overall mod assist for B LE and trunk support, hand over hand support to reach and pull with L UE   Transfers Overall transfer level: Needs assistance   Transfers: Lateral/Scoot Transfers          Lateral/Scoot Transfers: Max assist;+2 physical assistance;+2 safety/equipment General transfer comment: using bed pad, lateral scoot towards R side with max +2, pt supporting with foward lean     Balance Overall balance assessment: Needs assistance Sitting-balance support: Bilateral upper extremity supported;Feet supported Sitting  balance-Leahy Scale: Fair Sitting balance - Comments: min guard sitting EOB, preference to B UE support; static sitting only                                   ADL either performed or assessed with clinical judgement   ADL Overall ADL's : Needs assistance/impaired Eating/Feeding: Set up;Sitting   Grooming: Set up;Sitting               Lower Body Dressing: Total assistance;Sitting/lateral leans;Bed level   Toilet Transfer: Maximal assistance;+2 for physical assistance;+2 for safety/equipment Toilet Transfer Details (indicate cue type and reason): lateral scoot simulated to recliner            General ADL Comments: continues to be limited by decreased activity tolerance and generalized weakenss      Vision       Perception     Praxis      Cognition Arousal/Alertness: Awake/alert Behavior During Therapy: WFL for tasks assessed/performed Overall Cognitive Status: Impaired/Different from baseline Area of Impairment: Problem solving                             Problem Solving: Slow processing;Decreased initiation;Requires verbal cues;Difficulty sequencing          Exercises     Shoulder Instructions       General Comments noted on 1L [place] upon entry, increased to 5L to sustain levels during mobility, decreased to 2L at end of session with saturations in low 90s; HR initally 97,  increased into 115-120 during mobility and sitting EOB- RN present and aware    Pertinent Vitals/ Pain       Pain Assessment: Faces Faces Pain Scale: Hurts a little bit Pain Location: generalized Pain Descriptors / Indicators: Aching;Grimacing;Guarding Pain Intervention(s): Limited activity within patient's tolerance;Monitored during session;Repositioned  Home Living                                          Prior Functioning/Environment              Frequency  Min 3X/week        Progress Toward Goals  OT Goals(current goals  can now be found in the care plan section)  Progress towards OT goals: Progressing toward goals  Acute Rehab OT Goals Patient Stated Goal: to get stronger OT Goal Formulation: With patient  Plan Discharge plan remains appropriate;Frequency remains appropriate    Co-evaluation    PT/OT/SLP Co-Evaluation/Treatment: Yes Reason for Co-Treatment: Complexity of the patient's impairments (multi-system involvement);For patient/therapist safety;To address functional/ADL transfers   OT goals addressed during session: ADL's and self-care      AM-PAC OT "6 Clicks" Daily Activity     Outcome Measure   Help from another person eating meals?: A Little Help from another person taking care of personal grooming?: None Help from another person toileting, which includes using toliet, bedpan, or urinal?: Total Help from another person bathing (including washing, rinsing, drying)?: A Lot Help from another person to put on and taking off regular upper body clothing?: A Lot Help from another person to put on and taking off regular lower body clothing?: Total 6 Click Score: 13    End of Session Equipment Utilized During Treatment: Oxygen  OT Visit Diagnosis: Other abnormalities of gait and mobility (R26.89);Muscle weakness (generalized) (M62.81)   Activity Tolerance Patient tolerated treatment well   Patient Left in chair;with call bell/phone within reach;with chair alarm set;with nursing/sitter in room   Nurse Communication Mobility status;Other (comment)(vitals)        Time: 5361-4431 OT Time Calculation (min): 23 min  Charges: OT General Charges $OT Visit: 1 Visit OT Treatments $Self Care/Home Management : 8-22 mins  Delight Stare, [place] Pager [phone] Office [phone]    Delight Stare 10/02/2018, 1:09 PM

## 2018-10-02 NOTE — Progress Notes (Signed)
  Speech Language Pathology Treatment: Dysphagia  Patient Details Name: [name] Rose MRN: [id number] DOB: Nov 04, 1950 Today's Date: 10/02/2018 Time: 2446-9507 SLP Time Calculation (min) (ACUTE ONLY): 9 min  Assessment / Plan / Recommendation Clinical Impression  Pt tolerated trials of regular texture solids with no clinical s/s of aspiration.  O2 sats at 97 on SLP arrival, brief desaturation on first trial to 89 with quick recovery suspected to be an artifact as signal was lost immediately prior to these readings.  Additional trials with O2 sats remaining at 94 or higher. Pt exhibited adequate oral clearance. Pt is eager to advance to regular solids.  Recommend regular texture diet with thin liquids.  SLP will continue to follow for diet tolerance.  Should pt exhibit difficulty with PO intake, or respiratory distress, please downgrade pt to mechanical soft diet and reconsult speech therapy.    HPI HPI: pt is a 68 yo male adm to [facility] with respiratory difficulties- diagnosed with pna and developed renal failure. PMH + for prostate cancer with bladder mets, chemo induced mucositis, - he required intubation and is on CRRT at this time. Pt intubated 6/2-6/01/2019. Pt has been on chemo and is hyperglycemic due to steroids. Swallow eval ordered. Pt had OG when intubated but it was removed with extubation.  Pt deconditioned, but no evidence of pna on CXR 6/14.      SLP Plan  Continue with current plan of care       Recommendations  Diet recommendations: Regular Liquids provided via: Straw;Cup Medication Administration: Whole meds with liquid Supervision: Staff to assist with self feeding Compensations: Minimize environmental distractions;Slow rate;Small sips/bites(Rest breaks as needed to conserve energy) Postural Changes and/or Swallow Maneuvers: Seated upright 90 degrees                SLP Visit Diagnosis: Dysphagia, oropharyngeal phase (R13.12) Plan: Continue with current plan of  care       [place], [place], [place] Office: [phone]; Pager (6/22): [phone] 10/02/2018, 1:26 PM

## 2018-10-02 NOTE — Progress Notes (Signed)
Heart rate up to 120's on movement and desats to low 80's on exertion. Assisted to bedside chair, heart rate down to less 100 on rest  and sats- 90% on 2L [place]. Continue to monitor.

## 2018-10-03 ENCOUNTER — Other Ambulatory Visit: Payer: Self-pay | Admitting: Hematology & Oncology

## 2018-10-03 LAB — GLUCOSE, CAPILLARY
Glucose-Capillary: 122 mg/dL — ABNORMAL HIGH (ref 70–99)
Glucose-Capillary: 114 mg/dL — ABNORMAL HIGH (ref 70–99)

## 2018-10-03 LAB — CBC WITH DIFFERENTIAL/PLATELET
Abs Immature Granulocytes: 0.04 10*3/uL (ref 0.00–0.07)
Basophils Absolute: 0 10*3/uL (ref 0.0–0.1)
Basophils Relative: 0 %
Eosinophils Absolute: 0.5 10*3/uL (ref 0.0–0.5)
Eosinophils Relative: 5 %
HCT: 29.8 % — ABNORMAL LOW (ref 39.0–52.0)
Hemoglobin: 9.8 g/dL — ABNORMAL LOW (ref 13.0–17.0)
Immature Granulocytes: 0 %
Lymphocytes Relative: 10 %
Lymphs Abs: 1 10*3/uL (ref 0.7–4.0)
MCH: 32.8 pg (ref 26.0–34.0)
MCHC: 32.9 g/dL (ref 30.0–36.0)
MCV: 99.7 fL (ref 80.0–100.0)
Monocytes Absolute: 0.7 10*3/uL (ref 0.1–1.0)
Monocytes Relative: 7 %
Neutro Abs: 7.2 10*3/uL (ref 1.7–7.7)
Neutrophils Relative %: 78 %
Platelets: 65 10*3/uL — ABNORMAL LOW (ref 150–400)
RBC: 2.99 MIL/uL — ABNORMAL LOW (ref 4.22–5.81)
RDW: 19 % — ABNORMAL HIGH (ref 11.5–15.5)
WBC: 9.3 10*3/uL (ref 4.0–10.5)
nRBC: 0 % (ref 0.0–0.2)

## 2018-10-03 LAB — RENAL FUNCTION PANEL
Albumin: 2.8 g/dL — ABNORMAL LOW (ref 3.5–5.0)
Anion gap: 14 (ref 5–15)
BUN: 110 mg/dL — ABNORMAL HIGH (ref 8–23)
CO2: 32 mmol/L (ref 22–32)
Calcium: 8.8 mg/dL — ABNORMAL LOW (ref 8.9–10.3)
Chloride: 89 mmol/L — ABNORMAL LOW (ref 98–111)
Creatinine, Ser: 3.93 mg/dL — ABNORMAL HIGH (ref 0.61–1.24)
GFR calc Af Amer: 17 mL/min — ABNORMAL LOW (ref >60)
GFR calc non Af Amer: 15 mL/min — ABNORMAL LOW (ref >60)
Glucose, Bld: 127 mg/dL — ABNORMAL HIGH (ref 70–99)
Phosphorus: 6 mg/dL — ABNORMAL HIGH (ref 2.5–4.6)
Potassium: 3.8 mmol/L (ref 3.5–5.1)
Sodium: 135 mmol/L (ref 135–145)

## 2018-10-03 LAB — LACTATE DEHYDROGENASE: LDH: 320 U/L — ABNORMAL HIGH (ref 98–192)

## 2018-10-03 MED ORDER — GABAPENTIN 300 MG PO CAPS
300.0000 mg | ORAL_CAPSULE | Freq: Every day | ORAL | Status: DC
  Administered 2018-10-03 – 2018-10-09 (×7): 300 mg via ORAL
  Filled 2018-10-03 (×7): qty 1

## 2018-10-03 MED ORDER — DARBEPOETIN ALFA 40 MCG/0.4ML IJ SOSY
PREFILLED_SYRINGE | INTRAMUSCULAR | Status: AC
  Administered 2018-10-03: 40 ug via INTRAVENOUS
  Filled 2018-10-03: qty 0.4

## 2018-10-03 MED ORDER — DARBEPOETIN ALFA 40 MCG/0.4ML IJ SOSY
40.0000 ug | PREFILLED_SYRINGE | Freq: Once | INTRAMUSCULAR | Status: AC
  Administered 2018-10-03: 40 ug via INTRAVENOUS
  Filled 2018-10-03: qty 0.4

## 2018-10-03 MED ORDER — CHLORHEXIDINE GLUCONATE CLOTH 2 % EX PADS
6.0000 | MEDICATED_PAD | Freq: Every day | CUTANEOUS | Status: DC
  Administered 2018-10-03 – 2018-10-09 (×7): 6 via TOPICAL

## 2018-10-03 MED ORDER — PRO-STAT SUGAR FREE PO LIQD
30.0000 mL | Freq: Two times a day (BID) | ORAL | Status: DC
  Administered 2018-10-03: 23:00:00 30 mL via ORAL
  Filled 2018-10-03 (×2): qty 30

## 2018-10-03 MED ORDER — HEPARIN SODIUM (PORCINE) 1000 UNIT/ML IJ SOLN
INTRAMUSCULAR | Status: AC
  Administered 2018-10-03: 15:00:00 2400 [IU]
  Filled 2018-10-03: qty 3

## 2018-10-03 NOTE — Plan of Care (Signed)
  Problem: Education: Goal: Knowledge of General Education information will improve Description: Including pain rating scale, medication(s)/side effects and non-pharmacologic comfort measures Outcome: Progressing   Problem: Health Behavior/Discharge Planning: Goal: Ability to manage health-related needs will improve Outcome: Progressing   Problem: Clinical Measurements: Goal: Ability to maintain clinical measurements within normal limits will improve Outcome: Progressing Goal: Will remain free from infection Outcome: Progressing Goal: Diagnostic test results will improve Outcome: Progressing Goal: Respiratory complications will improve Outcome: Progressing Goal: Cardiovascular complication will be avoided Outcome: Progressing   Problem: Activity: Goal: Risk for activity intolerance will decrease Outcome: Progressing   Problem: Nutrition: Goal: Adequate nutrition will be maintained Outcome: Progressing   Problem: Coping: Goal: Level of anxiety will decrease Outcome: Progressing   Problem: Elimination: Goal: Will not experience complications related to bowel motility Outcome: Progressing Goal: Will not experience complications related to urinary retention Outcome: Progressing   Problem: Safety: Goal: Ability to remain free from injury will improve Outcome: Progressing   Problem: Skin Integrity: Goal: Risk for impaired skin integrity will decrease Outcome: Progressing   Problem: Activity: Goal: Ability to tolerate increased activity will improve Outcome: Progressing   Problem: Clinical Measurements: Goal: Ability to maintain a body temperature in the normal range will improve Outcome: Progressing   Problem: Respiratory: Goal: Ability to maintain adequate ventilation will improve Outcome: Progressing Goal: Ability to maintain a clear airway will improve Outcome: Progressing

## 2018-10-03 NOTE — Progress Notes (Signed)
Inpatient Rehabilitation-Admissions Coordinator   [facility] continues to follow for medical readiness and tolerance with therapies. AC spoke to pt's wife who understands we are following and would like to pursue CIR if he is able.   Will follow up tomorrow.   [name], OTR/L  Rehab Admissions Coordinator  [phone] 10/03/2018 4:25 PM

## 2018-10-03 NOTE — TOC Progression Note (Signed)
Transition of Care [facility]) - Progression Note    Patient Details  Name: [name] Rose MRN: [id number] Date of Birth: 1951/03/03  Transition of Care [facility]) CM/SW [place] RN, BSN, NCM-BC, [place] [phone] Phone Number: 10/03/2018, 2:48 PM  Clinical Narrative:    CM team following for dispositional needs. Nephrologist following with patient unable to transition to CIR until his volume is managed, with patient receiving intermittent HD.  CM will continue to follow.   Expected Discharge Plan: [place] Barriers to Discharge: Continued Medical Work up  Expected Discharge Plan and Services Expected Discharge Plan: [place]       Living arrangements for the past 2 months: Single Family Home Expected Discharge Date: (unknown)                 Social Determinants of Health (SDOH) Interventions    Readmission Risk Interventions Readmission Risk Prevention Plan 09/08/2018 06/28/2018  Transportation Screening Complete Complete  PCP or Specialist Appt within 3-5 Days - Complete  HRI or [place] - Complete  Social Work Consult for [place] Planning/Counseling - Complete  Palliative Care Screening - Not Applicable  Medication Review Press photographer) Complete Complete  PCP or Specialist appointment within 3-5 days of discharge Not Complete -  PCP/Specialist Appt Not Complete comments not yet ready for d/c -  HRI or [place] Not Complete -  HRI or Home Care Consult Pt Refusal Comments no needs identified at this time -  SW Recovery Care/Counseling Consult Not Complete -  SW Consult Not Complete Comments no needs identified at this time -  Palliative Care Screening Not Applicable -  [place] Not Applicable -  Some recent data might be hidden

## 2018-10-03 NOTE — Progress Notes (Signed)
SLP Cancellation Note  Patient Details Name: [name] Rose MRN: [id number] DOB: [birth year]   Cancelled treatment:       Reason Eval/Treat Not Completed: Patient at procedure or test/unavailable   [name] Rose 10/03/2018, 1:05 PM  Pollyann Glen, M.A. [place] Acute Environmental education officer [phone] Office [phone]

## 2018-10-03 NOTE — Progress Notes (Signed)
[facility] PROGRESS NOTE  [name] Rose:[id number] DOB: July 11, 1950 DOA: 09/08/2018 PCP: Shirline Frees, MD   Narrative: 68 year old Caucasian male Prostate cancer in 2008 s/p prostatectomy--neg PSA per PCP metastatic bladder cancer (follows with [name]) on chemo permanent A. fib [place] 3 since 06/2018- no AC 2/2 severe thrombocytopenia Prior MSSA bacteremia HTN Prediabetes Admitted 09/08/2018 with probable viral pneumonia-coronavirus 192 negative in addition to hypotension and AKI creatinine up to 2.3 from 1.7 baseline  Has improved-await further improvement of Creat appreciate guidance as per nephrology re: timing of CIR placement  A & Plan Pneumonia? Viral Supportive management at this time Abx from 5/29-->6/5 BAL from 6/2 nad Periodic CXR as PRN--he is not needing much oxygen at bedsdie--RN to inform if desats Persistent A. Fib-CHAD2Vasc2=3 Prior attempt with Flecainide-currently rate controlled cardizem 30 q6 No AC 2/2 TCP Metastatic bladder and prostate cancer TCP Nplate per Oncology--plt have been stable Rec'd in past Padcev x 3 cycles Low grade myelodysplasia on Path--? 2/2 chemo TCP seems to be improving DM ty ii  Cont SSI, CBG 118--->180's AKI Hypomagnesemia, hypokalemia, metabolic alkalosis from diuresis Received CRRT, Dialyzing today 6/23 per neprho K approp, alkalosis improved- -17.6 liters MSSA bacteremia 06/2018 immunocompromised 2/2 cancer--no current concerns HTN Oropharyngeal dysphagia Currently dys 3 diet Constipation Cont Miralax and Senna--stop dulcolax + sorbitol today as no stool BMI 28  DVT none  Code Status: full  Communication:   Disposition Plan: sdu today--->Tele am? CIR eventually    [place] Lapaglia, MD  [facility] Via [employer] app OR [email or web address] 7PM-7AM contact night coverage as above 10/03/2018, 10:00 AM  LOS: 25 days   Consultants:  nephrology  Procedures:    Antimicrobials:  As above  Interval  history/Subjective:  Well Looks stronger No fever chills no cough no cold   Objective:  Vitals:  Vitals:   10/03/18 0412 10/03/18 0721  BP: 130/86 131/80  Pulse: 98 89  Resp: 14 19  Temp: (!) 97.5 F (36.4 C) 97.6 F (36.4 C)  SpO2: 96% 91%    Exam:  eomi ncat in nad, thick neck--on [place] at bedisde but off of it is 100% on RA cta b no added sound abd soft Neuro intact RIJ noted, L Chest access noted  I have personally reviewed the following:  DATA   Labs:  WBC down form 10.7-->8.8-->9.3  Hemoglobin stable 9.9-9.8  PLT 56--->65  K2.9-->3.8  BUN/CREAT 113/4.0----->115/4.29--->110/3.9  Mag 1.6-->2.2, phos 7.1--->6.5  Co2 35--->33-->32  LDH 347  Scheduled Meds: . chlorhexidine  15 mL Mouth Rinse BID  . Chlorhexidine Gluconate Cloth  6 each Topical Daily  . Chlorhexidine Gluconate Cloth  6 each Topical Q0600  . darbepoetin (ARANESP) injection - DIALYSIS  40 mcg Intravenous Once  . diltiazem  30 mg Per Tube Q6H  . feeding supplement (ENSURE ENLIVE)  237 mL Oral BID BM  . feeding supplement (PRO-STAT SUGAR FREE 64)  30 mL Oral Daily  . gabapentin  300 mg Oral Daily  . insulin aspart  0-9 Units Subcutaneous TID WC  . mouth rinse  15 mL Mouth Rinse q12n4p  . metoprolol tartrate  2.5 mg Intravenous Once  . multivitamin  1 tablet Oral QHS  . pantoprazole  40 mg Oral Daily  . polyethylene glycol  17 g Oral Daily  . potassium chloride  40 mEq Oral Daily  . sevelamer carbonate  800 mg Oral TID WC  . sodium chloride flush  3 mL Intravenous Q12H  . sorbitol  20 mL Oral Daily  Continuous Infusions: . sodium chloride Stopped (09/28/18 0600)    Principal Problem:   Pneumonia Active Problems:   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 (HCC)   LFT elevation   AKI (acute kidney injury) (HCC)   CKD (chronic kidney disease), stage III (HCC)   Acute respiratory failure with hypoxia (HCC)   Acute renal failure (HCC)   Acute renal failure with  acute tubular necrosis superimposed on stage 3 chronic kidney disease ([place])   Metastatic cancer ([place])   Palliative care encounter   Chronic atrial fibrillation   LOS: 25 days

## 2018-10-03 NOTE — Progress Notes (Signed)
Foley cath clamped for 1 hour with no sensation, md aware. Transported to HD  by bed awake and alert.

## 2018-10-03 NOTE — Progress Notes (Signed)
[facility]    NEPHROLOGY PROGRESS NOTE  SUBJECTIVE: Patient had 1.9 liters UOP over 6/22.  He last had HD on 6/15.  Denies dizziness or cramping.    Review of systems:  No shortness of breath  No chest pain No nausea or vomiting    OBJECTIVE:  Vitals:   10/03/18 0412 10/03/18 0721  BP: 130/86 131/80  Pulse: 98 89  Resp: 14 19  Temp: (!) 97.5 F (36.4 C) 97.6 F (36.4 C)  SpO2: 96% 91%    Intake/Output Summary (Last 24 hours) at 10/03/2018 7341 Last data filed at 10/03/2018 9379 Gross per 24 hour  Intake 340 ml  Output 2150 ml  Net -1810 ml      General:  AAOx3 NAD  HEENT: MMM [employer] AT anicteric sclera CV:  Heart RRR  Lungs:  Clear to auscultation anteriorly and normal work of breathing Abd:  abd SNT/ND with normal BS GU:  Bladder non-palpable Extremities: no edema  Access:  Right IJ nontunneled catheter   MEDICATIONS:  . chlorhexidine  15 mL Mouth Rinse BID  . Chlorhexidine Gluconate Cloth  6 each Topical Daily  . diltiazem  30 mg Per Tube Q6H  . feeding supplement (ENSURE ENLIVE)  237 mL Oral BID BM  . feeding supplement (PRO-STAT SUGAR FREE 64)  30 mL Oral Daily  . gabapentin  300 mg Oral Daily  . insulin aspart  0-9 Units Subcutaneous TID WC  . mouth rinse  15 mL Mouth Rinse q12n4p  . metoprolol tartrate  2.5 mg Intravenous Once  . multivitamin  1 tablet Oral QHS  . pantoprazole  40 mg Oral Daily  . polyethylene glycol  17 g Oral Daily  . potassium chloride  40 mEq Oral Daily  . sevelamer carbonate  800 mg Oral TID WC  . sodium chloride flush  3 mL Intravenous Q12H  . sorbitol  20 mL Oral Daily       LABS:   CBC Latest Ref Rng & Units 10/03/2018 10/02/2018 10/01/2018  WBC 4.0 - 10.5 K/uL 9.3 9.1 8.8  Hemoglobin 13.0 - 17.0 g/dL 9.8(L) 9.9(L) 10.0(L)  Hematocrit 39.0 - 52.0 % 29.8(L) 30.4(L) 30.7(L)  Platelets 150 - 400 K/uL 65(L) 56(L) 52(L)    CMP Latest Ref Rng & Units 10/03/2018 10/02/2018 10/01/2018  Glucose 70 - 99 mg/dL 127(H)  131(H) 130(H)  BUN 8 - 23 mg/dL 110(H) 115(H) 113(H)  Creatinine 0.61 - 1.24 mg/dL 3.93(H) 4.29(H) 4.50(H)  Sodium 135 - 145 mmol/L 135 135 136  Potassium 3.5 - 5.1 mmol/L 3.8 3.8 2.9(L)  Chloride 98 - 111 mmol/L 89(L) 87(L) 86(L)  CO2 22 - 32 mmol/L 32 33(H) 35(H)  Calcium 8.9 - 10.3 mg/dL 8.8(L) 8.6(L) 8.1(L)  Total Protein 6.5 - 8.1 g/dL - - -  Total Bilirubin 0.3 - 1.2 mg/dL - - -  Alkaline Phos 38 - 126 U/L - - -  AST 15 - 41 U/L - - -  ALT 0 - 44 U/L - - -    Lab Results  Component Value Date   PTH 96 (H) 09/25/2018   CALCIUM 8.8 (L) 10/03/2018   CAION 1.19 09/20/2016   PHOS 6.0 (H) 10/03/2018       Component Value Date/Time   COLORURINE YELLOW 09/18/2018 1302   APPEARANCEUR CLOUDY (A) 09/18/2018 1302   LABSPEC 1.011 09/18/2018 1302   PHURINE 5.0 09/18/2018 1302   GLUCOSEU >=500 (A) 09/18/2018 1302   HGBUR MODERATE (A) 09/18/2018 1302   BILIRUBINUR NEGATIVE  09/18/2018 1302   KETONESUR NEGATIVE 09/18/2018 1302   PROTEINUR NEGATIVE 09/18/2018 1302   UROBILINOGEN 0.2 01/31/2010 0624   NITRITE NEGATIVE 09/18/2018 1302   LEUKOCYTESUR NEGATIVE 09/18/2018 1302      Component Value Date/Time   PHART 7.255 (L) 09/17/2018 0317   PCO2ART 39.0 09/17/2018 0317   PO2ART 66.3 (L) 09/17/2018 0317   HCO3 16.7 (L) 09/17/2018 0317   TCO2 29 09/20/2016 0657   ACIDBASEDEF 9.2 (H) 09/17/2018 0317   O2SAT 90.7 09/17/2018 0317       Component Value Date/Time   IRON 88 07/24/2018 1230   TIBC 240 07/24/2018 1230   FERRITIN 3,307 (H) 09/08/2018 1047   IRONPCTSAT 37 07/24/2018 1230       ASSESSMENT/PLAN:     68 year old male patient who presented with respiratory failure and fever secondary to presumed pneumonia.  He was started on empiric antibiotics.  COVID-19 was negative.  His respiratory status worsened requiring mechanical ventilation and CRRT for acute renal failure.  He is currently on intermittent hemodialysis as needed.  1.  Acute kidney injury.   - Last HD appears  6/15.  Significant azotemia  - Will perform HD today for clearance - With AKI would decrease gabapentin to a max of 300 mg daily   2.  Chronic kidney disease stage III.  His baseline serum creatinine runs around 1.4-1.5.  I suspect his chronic kidney disease on the basis of longstanding diabetes.  His urine is negative for protein.  Renal ultrasound from 09/18/2018 was negative for hydronephrosis.  PTH 96 from 09/25/2018.    3.  Secondary hyperparathyroidism.  We will continue a phosphate binder for hyperphosphatemia.  4.  Anemia.  Hemoglobin is stable; no acute need for PRBC's.  aranesp 40 mcg once today, 6/23.  5.  Pneumonia.  Respiratory status is improving.

## 2018-10-03 NOTE — Progress Notes (Signed)
Back from hemodialysis by bed awake and alert. Claimed to be feeling well.

## 2018-10-03 NOTE — Progress Notes (Signed)
Full note to follow  D/w Nephrologist Dr Lucinda Dell d/c just yet to CIR--needs volume management

## 2018-10-03 NOTE — Progress Notes (Signed)
Nutrition Follow-up  DOCUMENTATION CODES:   Obesity unspecified  INTERVENTION:   - Magic cup TID with meals, each supplement provides 290 kcal and 9 grams of protein  - Increase Pro-stat to 30 ml BID, each supplement provides 100 kcals and 15 grams protein  - d/c Ensure Enlive  - Encourage adequate PO intake  NUTRITION DIAGNOSIS:   Inadequate oral intake related to inability to eat as evidenced by NPO status.  Progressing, pt is now on a Regular diet  GOAL:   Patient will meet greater than or equal to 90% of their needs  Progressing  MONITOR:   Labs, Weight trends, TF tolerance, I & O's  REASON FOR ASSESSMENT:   Consult Enteral/tube feeding initiation and management  ASSESSMENT:   68 y.o. male with medical history significant of hypertension, hyperlipidemia, prediabetes, prostate cancer, metastasized bladder cancer on chemotherapy, PAF, CKD stage III, MSSA bacteremia.  Patient presented secondary to significant weakness overnight. Admitted for pneumonia.  6/08 - CRRT initiated 6/10 - extubated 6/12 - NGT placed, TF started 6/13 - CRRT d/c, transferred to [facility] 6/15 - diet advanced to Dysphagia 3 6/16 - s/p bone marrow biopsy with findings of no malignancy 6/17 - NGT removed 6/22 - diet advanced to Regular  Pt for HD again today. Weight down 37 lbs since admit and 8 lbs overnight. Question accuracy vs method. Will continue to monitor trends.  Spoke with pt at bedside. Pt reports that his appetite and PO intake have improved. Pt states he ate "most" of breakfast. Pt denies any N/V or difficulties chewing and swallowing at this time.  Pt endorses still feeling weak. Pt states he had help to get to the recliner a few days but that it was very difficult.  Pt endorses taking Pro-stat supplements. Pt states he doesn't drink Ensure Enlive because he eats well at meals and is too full for Ensure Enlive when the time comes. RD will d/c order for Ensure Enlive and increase  Pro-stat to BID. Will also order Magic Cup with meals.  RD encouraged continued PO intake and discussed the importance of kcal and protein in maintaining lean muscle mass and preventing dry weight loss. Pt expresses understanding.  Meal Completion: 50-100% x last 8 meals  Medications reviewed and include: Pro-stat 30 ml daily, Ensure Enlive BID (pt refusing), SSI, rena-vit, Protonix, Miralax, K-dur 40 mEq daily, Renvela TID with meals, sorbitol  Labs reviewed: phosphorus 6.0, hemoglobin 9.8 CBG's: 122-185 x 24 hours  UOP: 1850 ml x 24 hours I/O's: -19.9 L since admit  NUTRITION - FOCUSED PHYSICAL EXAM:    Most Recent Value  Orbital Region  Mild depletion  Upper Arm Region  No depletion  Thoracic and Lumbar Region  No depletion  Buccal Region  No depletion  Temple Region  No depletion  Clavicle Bone Region  Mild depletion  Clavicle and Acromion Bone Region  Mild depletion  Scapular Bone Region  No depletion  Dorsal Hand  Mild depletion  Patellar Region  Mild depletion  Anterior Thigh Region  Mild depletion  Posterior Calf Region  Mild depletion  Edema (RD Assessment)  Mild [BLE]  Hair  Reviewed  Eyes  Reviewed  Mouth  Reviewed  Skin  Reviewed  Nails  Reviewed       Diet Order:   Diet Order            Diet regular Room service appropriate? Yes; Fluid consistency: Thin  Diet effective now  EDUCATION NEEDS:   Not appropriate for education at this time  Skin:  Skin Assessment:  Skin Integrity Issues: Incisions: closed incision to buttocks  Last BM:  09/29/18  Height:   Ht Readings from Last 1 Encounters:  09/13/18 6' 2" (1.88 m)    Weight:   Wt Readings from Last 1 Encounters:  10/03/18 99.1 kg    Ideal Body Weight:  86.3 kg  BMI:  Body mass index is 28.05 kg/m.  Estimated Nutritional Needs:   Kcal:  2380-2610 kcal  Protein:  125-140 grams  Fluid:  UOP + 1000 ml    Gaynell Face, MS, RD, LDN Inpatient Clinical  Dietitian Pager: [phone] Weekend/After Hours: [phone]

## 2018-10-04 DIAGNOSIS — R7881 Bacteremia: Secondary | ICD-10-CM

## 2018-10-04 LAB — CBC WITH DIFFERENTIAL/PLATELET
Abs Immature Granulocytes: 0.04 10*3/uL (ref 0.00–0.07)
Basophils Absolute: 0 10*3/uL (ref 0.0–0.1)
Basophils Relative: 0 %
Eosinophils Absolute: 0.6 10*3/uL — ABNORMAL HIGH (ref 0.0–0.5)
Eosinophils Relative: 7 %
HCT: 29.3 % — ABNORMAL LOW (ref 39.0–52.0)
Hemoglobin: 9.3 g/dL — ABNORMAL LOW (ref 13.0–17.0)
Immature Granulocytes: 1 %
Lymphocytes Relative: 11 %
Lymphs Abs: 0.9 10*3/uL (ref 0.7–4.0)
MCH: 32.5 pg (ref 26.0–34.0)
MCHC: 31.7 g/dL (ref 30.0–36.0)
MCV: 102.4 fL — ABNORMAL HIGH (ref 80.0–100.0)
Monocytes Absolute: 0.7 10*3/uL (ref 0.1–1.0)
Monocytes Relative: 8 %
Neutro Abs: 5.8 10*3/uL (ref 1.7–7.7)
Neutrophils Relative %: 73 %
Platelets: 70 10*3/uL — ABNORMAL LOW (ref 150–400)
RBC: 2.86 MIL/uL — ABNORMAL LOW (ref 4.22–5.81)
RDW: 18.9 % — ABNORMAL HIGH (ref 11.5–15.5)
WBC: 8 10*3/uL (ref 4.0–10.5)
nRBC: 0 % (ref 0.0–0.2)

## 2018-10-04 LAB — RENAL FUNCTION PANEL
Albumin: 2.7 g/dL — ABNORMAL LOW (ref 3.5–5.0)
Anion gap: 10 (ref 5–15)
BUN: 47 mg/dL — ABNORMAL HIGH (ref 8–23)
CO2: 30 mmol/L (ref 22–32)
Calcium: 8.9 mg/dL (ref 8.9–10.3)
Chloride: 99 mmol/L (ref 98–111)
Creatinine, Ser: 2.21 mg/dL — ABNORMAL HIGH (ref 0.61–1.24)
GFR calc Af Amer: 34 mL/min — ABNORMAL LOW (ref >60)
GFR calc non Af Amer: 30 mL/min — ABNORMAL LOW (ref >60)
Glucose, Bld: 109 mg/dL — ABNORMAL HIGH (ref 70–99)
Phosphorus: 3.6 mg/dL (ref 2.5–4.6)
Potassium: 4.1 mmol/L (ref 3.5–5.1)
Sodium: 139 mmol/L (ref 135–145)

## 2018-10-04 LAB — GLUCOSE, CAPILLARY
Glucose-Capillary: 153 mg/dL — ABNORMAL HIGH (ref 70–99)
Glucose-Capillary: 103 mg/dL — ABNORMAL HIGH (ref 70–99)
Glucose-Capillary: 163 mg/dL — ABNORMAL HIGH (ref 70–99)
Glucose-Capillary: 108 mg/dL — ABNORMAL HIGH (ref 70–99)
Glucose-Capillary: 137 mg/dL — ABNORMAL HIGH (ref 70–99)

## 2018-10-04 LAB — LACTATE DEHYDROGENASE: LDH: 304 U/L — ABNORMAL HIGH (ref 98–192)

## 2018-10-04 MED ORDER — ENOXAPARIN SODIUM 40 MG/0.4ML ~~LOC~~ SOLN
40.0000 mg | SUBCUTANEOUS | Status: DC
  Administered 2018-10-04 – 2018-10-13 (×10): 40 mg via SUBCUTANEOUS
  Filled 2018-10-04 (×10): qty 0.4

## 2018-10-04 MED ORDER — SODIUM CHLORIDE 0.9 % IV SOLN
INTRAVENOUS | Status: AC
  Administered 2018-10-04: 10:00:00 via INTRAVENOUS

## 2018-10-04 NOTE — Progress Notes (Signed)
Pt's wife phoned for updates, in particular biopsy results from 6/16. She was provided with Dr. Freddie Apley office number for biopsy result.

## 2018-10-04 NOTE — Progress Notes (Addendum)
[facility]    NEPHROLOGY PROGRESS NOTE  SUBJECTIVE: Patient had 1.9 liters UOP over 6/23.   He had HD yesterday with no UF charted.  States hx enlarged prostate s/p prostatectomy (2008 per chart review).  (GU hx also with transurethral resection of bladder tumor 2018 per chart review).   Review of systems:  No shortness of breath  No chest pain No nausea or vomiting    OBJECTIVE:  Vitals:   10/04/18 0300 10/04/18 0733  BP: (!) 123/92 131/78  Pulse: 94 (!) 121  Resp: 14 18  Temp: 97.8 F (36.6 C) 97.9 F (36.6 C)  SpO2: 96% 97%    Intake/Output Summary (Last 24 hours) at 10/04/2018 0743 Last data filed at 10/04/2018 1761 Gross per 24 hour  Intake 360 ml  Output 600 ml  Net -240 ml      General:  AAOx3 NAD  HEENT: MMM [place] AT anicteric sclera CV:  Heart RRR  Lungs:  Clear and unlabored  Abd:  abd SNT/ND with normal BS Extremities: no edema  GU foley in place  Access:  Right IJ nontunneled catheter   MEDICATIONS:  . chlorhexidine  15 mL Mouth Rinse BID  . Chlorhexidine Gluconate Cloth  6 each Topical Daily  . Chlorhexidine Gluconate Cloth  6 each Topical Q0600  . diltiazem  30 mg Per Tube Q6H  . feeding supplement (PRO-STAT SUGAR FREE 64)  30 mL Oral BID  . gabapentin  300 mg Oral Daily  . insulin aspart  0-9 Units Subcutaneous TID WC  . mouth rinse  15 mL Mouth Rinse q12n4p  . multivitamin  1 tablet Oral QHS  . pantoprazole  40 mg Oral Daily  . polyethylene glycol  17 g Oral Daily  . potassium chloride  40 mEq Oral Daily  . sevelamer carbonate  800 mg Oral TID WC  . sorbitol  20 mL Oral Daily       LABS:   CBC Latest Ref Rng & Units 10/03/2018 10/02/2018 10/01/2018  WBC 4.0 - 10.5 K/uL 9.3 9.1 8.8  Hemoglobin 13.0 - 17.0 g/dL 9.8(L) 9.9(L) 10.0(L)  Hematocrit 39.0 - 52.0 % 29.8(L) 30.4(L) 30.7(L)  Platelets 150 - 400 K/uL 65(L) 56(L) 52(L)    CMP Latest Ref Rng & Units 10/04/2018 10/03/2018 10/02/2018  Glucose 70 - 99 mg/dL 109(H) 127(H)  131(H)  BUN 8 - 23 mg/dL 47(H) 110(H) 115(H)  Creatinine 0.61 - 1.24 mg/dL 2.21(H) 3.93(H) 4.29(H)  Sodium 135 - 145 mmol/L 139 135 135  Potassium 3.5 - 5.1 mmol/L 4.1 3.8 3.8  Chloride 98 - 111 mmol/L 99 89(L) 87(L)  CO2 22 - 32 mmol/L 30 32 33(H)  Calcium 8.9 - 10.3 mg/dL 8.9 8.8(L) 8.6(L)  Total Protein 6.5 - 8.1 g/dL - - -  Total Bilirubin 0.3 - 1.2 mg/dL - - -  Alkaline Phos 38 - 126 U/L - - -  AST 15 - 41 U/L - - -  ALT 0 - 44 U/L - - -    Lab Results  Component Value Date   PTH 96 (H) 09/25/2018   CALCIUM 8.9 10/04/2018   CAION 1.19 09/20/2016   PHOS 3.6 10/04/2018       Component Value Date/Time   COLORURINE YELLOW 09/18/2018 1302   APPEARANCEUR CLOUDY (A) 09/18/2018 1302   LABSPEC 1.011 09/18/2018 1302   PHURINE 5.0 09/18/2018 1302   GLUCOSEU >=500 (A) 09/18/2018 1302   HGBUR MODERATE (A) 09/18/2018 1302   BILIRUBINUR NEGATIVE 09/18/2018 1302  KETONESUR NEGATIVE 09/18/2018 1302   PROTEINUR NEGATIVE 09/18/2018 1302   UROBILINOGEN 0.2 01/31/2010 0624   NITRITE NEGATIVE 09/18/2018 1302   LEUKOCYTESUR NEGATIVE 09/18/2018 1302      Component Value Date/Time   PHART 7.255 (L) 09/17/2018 0317   PCO2ART 39.0 09/17/2018 0317   PO2ART 66.3 (L) 09/17/2018 0317   HCO3 16.7 (L) 09/17/2018 0317   TCO2 29 09/20/2016 0657   ACIDBASEDEF 9.2 (H) 09/17/2018 0317   O2SAT 90.7 09/17/2018 0317       Component Value Date/Time   IRON 88 07/24/2018 1230   TIBC 240 07/24/2018 1230   FERRITIN 3,307 (H) 09/08/2018 1047   IRONPCTSAT 37 07/24/2018 1230       ASSESSMENT/PLAN:     68 year old male patient who presented with respiratory failure and fever secondary to presumed pneumonia.  He was started on empiric antibiotics.  COVID-19 was negative.  His respiratory status worsened requiring mechanical ventilation and CRRT for acute renal failure.  He is currently on intermittent hemodialysis as needed.  1.  Acute kidney injury. Nonoliguric and resolving and may be 2/2 ATN.    -  Had HD on 6/23 for clearance after a long pause since 6/15.    - No acute need for HD today.  Follow labs 6/25 to determine need for additional HD - Will discontinue daily potassium supplement for now.  Note prior hx hyperkalemia - If he does continue to need HD would need a tunneled catheter - Trial of IV fluids today - 1 liter - On 6/25 reassess foley removal   2.  Chronic kidney disease stage III.  His baseline serum creatinine runs around 1.4-1.5.  I suspect his chronic kidney disease on the basis of longstanding diabetes.  His urine is negative for protein.  Renal ultrasound from 09/18/2018 was negative for hydronephrosis.  PTH 96 from 09/25/2018.    3.  Secondary hyperparathyroidism.  Improved following HD.  We will continue a phosphate binder for hyperphosphatemia.  Changed him to renal diet.  Discontinue renvela with overall improving renal function and monitor trends  4.  Anemia.  Hemoglobin is stable; no acute need for PRBC's.  aranesp 40 mcg once 6/23.  5.  Pneumonia.  Respiratory status is improving.   6. Thrombocytopenia - improving.  Hem/onc following.    Claudia Desanctis 10/04/2018 7:43 am

## 2018-10-04 NOTE — Progress Notes (Signed)
[name] Rose had dialysis yesterday.  It is obvious that he did because his BUN is now 47 creatinine 2.21.  His LDH is 304.  His CBC is not yet back.  His platelet count was relatively stable yesterday at 68,000.  He really wants to do physical therapy.  I understand this.  I think this would be good for him.  Maybe, he can to do [facility] inpatient rehab.  His appetite is doing okay.  He has had no nausea or vomiting.  There is no bleeding.  He has had no diarrhea.  He has had no fever.  His vital signs are all stable.  Temperature 97.8.  Pulse 94.  Blood pressure 123/92.  His lungs are clear bilaterally.  Cardiac exam regular rate and rhythm.  Abdomen is soft.  Bowel sounds are present.  There is no guarding.  Extremities shows no clubbing, cyanosis or edema.  Skin exam shows no rashes.  Neurological exam is nonfocal.  [name] Rose is improving steadily.  I just wish that his renal function would just get better.  We will continue to follow his platelets.  Hopefully, they will improve on their own.  Hopefully, he will be able to get some physical therapy.  I know that he is very motivated to do this.  [name], MD  Psalm 107:1

## 2018-10-04 NOTE — Progress Notes (Signed)
Physical Therapy Treatment Patient Details Name: [name] Rose MRN: [id number] DOB: 09/02/1950 Today's Date: 10/04/2018    History of Present Illness Pt is a 68 y/o male presenting with resiratory failure and fever secondary to presumed pneumonia.  Intubated 6/05/18/08, CRRT while in ICU.  Course complicated by anemia and atrial fib with RVR. PMH: prostate cancer with bladder mets, chemo induced mucositits, CKD, HTN, hyperglycemia.     PT Comments    Pt determined and participative.  Emphasis on standing in the STEDY   Follow Up Recommendations  CIR     Equipment Recommendations  Other (comment)    Recommendations for Other Services       Precautions / Restrictions Precautions Precautions: Fall    Mobility  Bed Mobility Overal bed mobility: Needs Assistance Bed Mobility: Sit to Supine       Sit to supine: Mod assist;+2 for physical assistance   General bed mobility comments: pt too fatigued to help maximally  Transfers Overall transfer level: Needs assistance     Sit to Stand: Max assist;+2 physical assistance         General transfer comment: 4 trials of standing into the STEDY with use of padding to assist standing  Ambulation/Gait                 Stairs             Wheelchair Mobility    Modified Rankin (Stroke Patients Only)       Balance     Sitting balance-Leahy Scale: Fair                                      Cognition Arousal/Alertness: Awake/alert Behavior During Therapy: WFL for tasks assessed/performed Overall Cognitive Status: Within Functional Limits for tasks assessed                                        Exercises General Exercises - Lower Extremity Hip Flexion/Marching: AROM;Strengthening;10 reps;Seated;Both(graded resistance in extention)    General Comments General comments (skin integrity, edema, etc.): EHR up into the 150's, sats in the lower 90's generally.  Pt visibly very  fatigued.      Pertinent Vitals/Pain Pain Assessment: Faces Faces Pain Scale: Hurts little more Pain Location: L knee Pain Descriptors / Indicators: Aching;Grimacing;Guarding Pain Intervention(s): Monitored during session    Home Living                      Prior Function            PT Goals (current goals can now be found in the care plan section) Acute Rehab PT Goals Patient Stated Goal: to get stronger PT Goal Formulation: With patient Time For Goal Achievement: 10/10/18 Potential to Achieve Goals: Good Progress towards PT goals: Progressing toward goals    Frequency    Min 3X/week      PT Plan Current plan remains appropriate    Co-evaluation PT/OT/SLP Co-Evaluation/Treatment: Yes Reason for Co-Treatment: Complexity of the patient's impairments (multi-system involvement);For patient/therapist safety PT goals addressed during session: Mobility/safety with mobility        AM-PAC PT "6 Clicks" Mobility   Outcome Measure  Help needed turning from your back to your side while in a flat bed without using bedrails?: A Lot Help needed  moving from lying on your back to sitting on the side of a flat bed without using bedrails?: A Lot Help needed moving to and from a bed to a chair (including a wheelchair)?: Total Help needed standing up from a chair using your arms (e.g., wheelchair or bedside chair)?: Total Help needed to walk in hospital room?: Total Help needed climbing 3-5 steps with a railing? : Total 6 Click Score: 8    End of Session   Activity Tolerance: Patient limited by fatigue Patient left: in bed;with call bell/phone within reach Nurse Communication: Mobility status;Need for lift equipment PT Visit Diagnosis: Unsteadiness on feet (R26.81);Other abnormalities of gait and mobility (R26.89);Muscle weakness (generalized) (M62.81)     Time: 2836-6294 PT Time Calculation (min) (ACUTE ONLY): 33 min  Charges:  $Therapeutic Activity: 8-22  mins                     10/04/2018  Donnella Sham, PT Acute Rehabilitation Services [phone]  (pager) [phone]  (office)   Tessie Fass Keiosha Cancro 10/04/2018, 2:47 PM

## 2018-10-04 NOTE — Progress Notes (Signed)
  Speech Language Pathology Treatment: Dysphagia  Patient Details Name: [name] Rose MRN: [id number] DOB: July 29, 1950 Today's Date: 10/04/2018 Time: 1916-6060 SLP Time Calculation (min) (ACUTE ONLY): 15 min  Assessment / Plan / Recommendation Clinical Impression  Pt appears less SOB during PO intake compared to this SLP's last visit with him. He self-fed a small snack of regular solids and thin liquids with a single cough that he describes as an isolated incident. He denies coughing during meals, and RN describes no overt concerns as well. He remains afebrile. Education was reinforced about precautions including the importance of rest breaks and energy conservation. He has no subjective concerns at this time and is no longer on any type of dysphagia diet. No further acute SLP needed at this time. Will sign off - please reorder if any acute needs arise.    HPI HPI: pt is a 68 yo male adm to [facility] with respiratory difficulties- diagnosed with pna and developed renal failure. PMH + for prostate cancer with bladder mets, chemo induced mucositis, - he required intubation and is on CRRT at this time. Pt intubated 6/2-6/01/2019. Pt has been on chemo and is hyperglycemic due to steroids. Swallow eval ordered. Pt had OG when intubated but it was removed with extubation.  Pt deconditioned, but no evidence of pna on CXR 6/14.      SLP Plan  All goals met       Recommendations  Diet recommendations: Regular;Thin liquid Liquids provided via: Straw;Cup Medication Administration: Whole meds with liquid Supervision: Patient able to self feed;Intermittent supervision to cue for compensatory strategies Compensations: Slow rate;Small sips/bites;Other (Comment)(take rest breaks PRN) Postural Changes and/or Swallow Maneuvers: Seated upright 90 degrees                Oral Care Recommendations: Oral care BID Follow up Recommendations: None SLP Visit Diagnosis: Dysphagia, oropharyngeal phase  (R13.12) Plan: All goals met       GO                Venita Sheffield Akia Montalban 10/04/2018, 2:49 PM  Pollyann Glen, M.A. [place] Acute Environmental education officer [phone] Office [phone]

## 2018-10-04 NOTE — Progress Notes (Signed)
Pt able to stand using sara stedy with 3 person assist, 2 to help stand and 1 to manage flaps on stedy. Pt with HR to 166 non sustained with exertion. Pt works very hard and is highly motivated.   10/04/18 1400  OT Visit Information  Last OT Received On 10/04/18  Assistance Needed +2  PT/OT/SLP Co-Evaluation/Treatment Yes  Reason for Co-Treatment For patient/therapist safety  OT goals addressed during session Strengthening/ROM  History of Present Illness Pt is a 68 y/o male presenting with resiratory failure and fever secondary to presumed pneumonia.  Intubated 6/05/18/08, CRRT while in ICU.  Course complicated by anemia and atrial fib with RVR. PMH: prostate cancer with bladder mets, chemo induced mucositits, CKD, HTN, hyperglycemia.   Precautions  Precautions Fall  Pain Assessment  Pain Assessment Faces (Simultaneous filing. User may not have seen previous data.)  Faces Pain Scale 6 (Simultaneous filing. User may not have seen previous data.)  Pain Location L knee  Pain Descriptors / Indicators Sore;Guarding;Grimacing  Pain Intervention(s) Monitored during session;Repositioned  Cognition  Arousal/Alertness Awake/alert  Behavior During Therapy WFL for tasks assessed/performed  Overall Cognitive Status Within Functional Limits for tasks assessed  ADL  Functional mobility during ADLs +2 for physical assistance;Total assistance (sara stedy)  Bed Mobility  Overal bed mobility Needs Assistance  Bed Mobility Sit to Supine  Sit to supine +2 for physical assistance;Total assist  General bed mobility comments assist for all aspects  Balance  Overall balance assessment Needs assistance  Sitting balance-Leahy Scale Fair  Standing balance support Bilateral upper extremity supported;During functional activity  Standing balance-Leahy Scale Poor  Transfers  Overall transfer level Needs assistance  Transfer via Lift Equipment Stedy  Transfers Sit to/from Stand  Sit to Stand +2 physical  assistance;Max assist  General transfer comment attempted x 2 without success from chair, RN assisted with stedy flaps as OT and PT pulled pt into standing with support under hips, returned to bed standing from stedy   OT - End of Session  Activity Tolerance Patient limited by fatigue  Patient left in bed;with call bell/phone within reach  Nurse Communication Mobility status  OT Assessment/Plan  OT Plan Discharge plan remains appropriate;Frequency remains appropriate  OT Visit Diagnosis Other abnormalities of gait and mobility (R26.89);Muscle weakness (generalized) (M62.81)  OT Frequency (ACUTE ONLY) Min 2X/week  Follow Up Recommendations CIR;Supervision/Assistance - 24 hour  OT Equipment 3 in 1 bedside commode  AM-PAC OT "6 Clicks" Daily Activity Outcome Measure (Version 2)  Help from another person eating meals? 3  Help from another person taking care of personal grooming? 4  Help from another person toileting, which includes using toliet, bedpan, or urinal? 1  Help from another person bathing (including washing, rinsing, drying)? 2  Help from another person to put on and taking off regular upper body clothing? 2  Help from another person to put on and taking off regular lower body clothing? 1  6 Click Score 13  OT Goal Progression  Progress towards OT goals Progressing toward goals  Acute Rehab OT Goals  Patient Stated Goal to get stronger  OT Goal Formulation With patient  Time For Goal Achievement 10/10/18  Potential to Achieve Goals Good  OT Time Calculation  OT Start Time (ACUTE ONLY) 1305  OT Stop Time (ACUTE ONLY) 1338  OT Time Calculation (min) 33 min  OT General Charges  $OT Visit 1 Visit  OT Treatments  $Therapeutic Activity 8-22 mins  Nestor Lewandowsky, OTR/L Acute Rehabilitation Services Pager: [phone]  Office: [phone]

## 2018-10-04 NOTE — Care Management Important Message (Signed)
Important Message  Patient Details  Name: [name] Rose MRN: [id number] Date of Birth: May 22, 1950   Medicare Important Message Given:  Yes     Memory Argue 10/04/2018, 1:38 PM

## 2018-10-04 NOTE — Progress Notes (Addendum)
PROGRESS NOTE    [name] Rose  HUD:[id number] DOB: [birth year] DOA: 09/08/2018 PCP: Shirline Frees, MD   Brief Narrative:  68 y.o. 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    Patient presented secondary to significant weakness overnight.  He reports no associated symptoms.  Weakness occurred acutely as he felt normal the day prior.  He reports receiving chemotherapy on a 2-week on 1 week off regimen.  Last round of chemotherapy was just over 1-1/2 weeks ago.  Patient reports not generally feeling this week with chemotherapy.  Weakness has improved slightly since coming to the hospital. Admitted 09/08/2018 with probable viral pneumonia-coronavirus 192 negative in addition to hypotension and AKI creatinine up to 2.3 from 1.7 baseline     Subjective: A/O x4, negative CP, negative S OB, negative abdominal pain.   Assessment & Plan:   Principal Problem:   Pneumonia Active Problems:   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 (HCC)   LFT elevation   AKI (acute kidney injury) ([place])   CKD (chronic kidney disease), stage III (HCC)   Acute respiratory failure with hypoxia (HCC)   Acute renal failure (HCC)   Acute renal failure with acute tubular necrosis superimposed on stage 3 chronic kidney disease (HCC)   Metastatic cancer (HCC)   Palliative care encounter   Chronic atrial fibrillation   Acute renal failure with ATN superimposed on CKD stage III (baseline Cr 1.4-1.5) -6/24 per nephrology no requirement for HD today - Discussed case with Dr. Harrie Jeans nephrology still unsure if patient will be totally HD dependent.  Dependent upon patient's U OP will determine if patient requires HD tomorrow and in the next couple of days will determine if patient will be permanent HD patient  Persistent atrial fibrillation -CHAD2VASC2 = 3 - Currently rate controlled - Cardizem 30 mg every 6  hours -Lovenox 40 mg daily - Labetalol PRN -Metoprolol PRN  Essential HTN -See A. fib  Metastatic bladder and prostate cancer - Oncology [name] managing - 6/16 bone marrow biopsy left ilium negative cancer cells see results below - Wife instructed by RN to contact oncology for results - Patient unsure of last chemo dose and cannot tell by current oncology notes when last chemo regimen administered nor what future plans are we will have to do more research  Diabetes type 2 controlled with complication - Hemoglobin A1c pending -Lipid panel pending - Sensitive SSI   Viral pneumonia? -Supportive care -Patient received small dose of antibiotics but discontinued. - BAL 6/2 showed Candida albicans, not treated  Dysphasia oral pharyngeal - Continue dysphagia 3 diet      DVT prophylaxis: Lovenox Code Status: Full Family Communication: None Disposition Plan: TBD   Consultants:  Nephrology Oncology    Procedures/Significant Events:  6/16:Bone Marrow, Aspirate,Biopsy, and Clot, left ilium - MILDLY HYPERCELLULAR MARROW WITH MEGAKARYOCYTIC HYPOPLASIA AND DYSPLASIA. - SEE COMMENT. PERIPHERAL BLOOD: - MACROCYTIC ANEMIA. - THROMBOCYTOPENIA. Diagnosis Note The marrow is mildly hypercellular. Megakaryocytes are reduced and dysplastic. There is very mild dysplasia in the erythroid cells. Overall, these changes are suggestive of a low-grade myelodysplastic syndrome, although non-neoplastic causes of dysplasia should be excluded. Correlation with FISH is recommended.   I have personally reviewed and interpreted all radiology studies and my findings are as above.  VENTILATOR SETTINGS:    Cultures 5/29 blood right Port-A-Cath negative 5/29 blood left antecubital negative 5/29 urine insignificant growth 5/29 MRSA by PCR negative 6/2 BAL  positive Candida albicans     Antimicrobials: Anti-infectives (From admission, onward)   Start     Stop   09/15/18 0600   ceFEPIme (MAXIPIME) 2 g in sodium chloride 0.9 % 100 mL IVPB  Status:  Discontinued     09/16/18 0957   09/11/18 1200  sulfamethoxazole-trimethoprim (BACTRIM) 560 mg in dextrose 5 % 500 mL IVPB  Status:  Discontinued     09/13/18 1309   09/10/18 1800  azithromycin (ZITHROMAX) 500 mg in sodium chloride 0.9 % 250 mL IVPB  Status:  Discontinued     09/16/18 0957   09/09/18 1800  vancomycin (VANCOCIN) 1,250 mg in sodium chloride 0.9 % 250 mL IVPB  Status:  Discontinued     09/08/18 1744   09/08/18 1800  vancomycin (VANCOCIN) 2,000 mg in sodium chloride 0.9 % 500 mL IVPB  Status:  Discontinued     09/08/18 1744   09/08/18 1800  ceFEPIme (MAXIPIME) 2 g in sodium chloride 0.9 % 100 mL IVPB  Status:  Discontinued     09/14/18 0816   09/08/18 0500  vancomycin (VANCOCIN) IVPB 1000 mg/200 mL premix     09/08/18 0612   09/08/18 0500  ceFEPIme (MAXIPIME) 2 g in sodium chloride 0.9 % 100 mL IVPB     09/08/18 0544       Devices    LINES / TUBES:      Continuous Infusions:   Objective: Vitals:   10/03/18 1900 10/03/18 2300 10/04/18 0300 10/04/18 0733  BP: 118/83 (!) 147/85 (!) 123/92 131/78  Pulse: (!) 103 98 94 (!) 121  Resp: (!) 27 (!) _0 Temp: 98.1 F (36.7 C) 97.7 F (36.5 C) 97.8 F (36.6 C) 97.9 F (36.6 C)  TempSrc: Oral Oral Oral Oral  SpO2: 96% 94% 96% 97%  Weight:   100.5 kg   Height:        Intake/Output Summary (Last 24 hours) at 10/04/2018 0740 Last data filed at 10/04/2018 2440 Gross per 24 hour  Intake 360 ml  Output 600 ml  Net -240 ml   Filed Weights   10/03/18 0412 10/03/18 1506 10/04/18 0300  Weight: 99.1 kg 100.4 kg 100.5 kg    Examination:  General: A/O x4, no acute respiratory distress Eyes: negative scleral hemorrhage, negative anisocoria, negative icterus ENT: Negative Runny nose, negative gingival bleeding, Neck:  Negative scars, masses, torticollis, lymphadenopathy, JVD, right IJ Vas-Cath in place negative sign of infection Lungs:  Tachypneic, clear to auscultation bilaterally without wheezes or crackles Cardiovascular: Irregularly irregular rhythm and rate, without murmur gallop or rub normal S1 and S2 Abdomen: negative abdominal pain, nondistended, positive soft, bowel sounds, no rebound, no ascites, no appreciable mass Extremities: No significant cyanosis, clubbing, or edema bilateral lower extremities Skin: Negative rashes, lesions, ulcers Psychiatric:  Negative depression, negative anxiety, negative fatigue, negative mania  Central nervous system:  Cranial nerves II through XII intact, tongue/uvula midline, all extremities muscle strength 5/5, sensation intact throughout,  negative dysarthria, negative expressive aphasia, negative receptive aphasia.  .     Data Reviewed: Care during the described time interval was provided by me .  I have reviewed this patient's available data, including medical history, events of note, physical examination, and all test results as part of my evaluation.   CBC: Recent Labs  Lab 09/29/18 0448 09/30/18 0413 10/01/18 0535 10/02/18 0500 10/03/18 0548  WBC 9.5 10.7* 8.8 9.1 9.3  NEUTROABS  --   --   --  7.3 7.2  HGB 9.5* 10.2* 10.0* 9.9* 9.8*  HCT 28.9* 31.2* 30.7* 30.4* 29.8*  MCV 99.7 99.7 99.7 99.7 99.7  PLT 39* 49* 52* 56* 65*   Basic Metabolic Panel: Recent Labs  Lab 09/30/18 0413 10/01/18 0535 10/02/18 0500 10/03/18 0548 10/04/18 0500  NA 136 136 135 135 139  K 3.3* 2.9* 3.8 3.8 4.1  CL 88* 86* 87* 89* 99  CO2 33* 35* 33* 32 30  GLUCOSE 137* 130* 131* 127* 109*  BUN 104* 113* 115* 110* 47*  CREATININE 4.31* 4.50* 4.29* 3.93* 2.21*  CALCIUM 8.5* 8.1* 8.6* 8.8* 8.9  MG  --  1.6* 2.2  --   --   PHOS 6.6* 7.1* 6.5* 6.0* 3.6   GFR: Estimated Creatinine Clearance: 41.1 mL/min (A) (by C-G formula based on SCr of 2.21 mg/dL (H)). Liver Function Tests: Recent Labs  Lab 09/29/18 1010 09/30/18 0413 10/01/18 0535 10/02/18 0500 10/03/18 0548 10/04/18 0500   AST 42*  --   --   --   --   --   ALT 43  --   --   --   --   --   ALKPHOS 59  --   --   --   --   --   BILITOT 0.8  --   --   --   --   --   PROT 4.8*  --   --   --   --   --   ALBUMIN 2.4* 2.6* 2.7* 2.8* 2.8* 2.7*   No results for input(s): LIPASE, AMYLASE in the last 168 hours. No results for input(s): AMMONIA in the last 168 hours. Coagulation Profile: No results for input(s): INR, PROTIME in the last 168 hours. Cardiac Enzymes: No results for input(s): CKTOTAL, CKMB, CKMBINDEX, TROPONINI in the last 168 hours. BNP (last 3 results) No results for input(s): PROBNP in the last 8760 hours. HbA1C: No results for input(s): HGBA1C in the last 72 hours. CBG: Recent Labs  Lab 10/02/18 2156 10/03/18 0618 10/03/18 1616 10/03/18 2121 10/04/18 0636  GLUCAP 185* 122* 114* 153* 103*   Lipid Profile: No results for input(s): CHOL, HDL, LDLCALC, TRIG, CHOLHDL, LDLDIRECT in the last 72 hours. Thyroid Function Tests: No results for input(s): TSH, T4TOTAL, FREET4, T3FREE, THYROIDAB in the last 72 hours. Anemia Panel: No results for input(s): VITAMINB12, FOLATE, FERRITIN, TIBC, IRON, RETICCTPCT in the last 72 hours. Urine analysis:    Component Value Date/Time   COLORURINE YELLOW 09/18/2018 1302   APPEARANCEUR CLOUDY (A) 09/18/2018 1302   LABSPEC 1.011 09/18/2018 1302   PHURINE 5.0 09/18/2018 1302   GLUCOSEU >=500 (A) 09/18/2018 1302   HGBUR MODERATE (A) 09/18/2018 1302   BILIRUBINUR NEGATIVE 09/18/2018 1302   KETONESUR NEGATIVE 09/18/2018 1302   PROTEINUR NEGATIVE 09/18/2018 1302   UROBILINOGEN 0.2 01/31/2010 0624   NITRITE NEGATIVE 09/18/2018 1302   LEUKOCYTESUR NEGATIVE 09/18/2018 1302   Sepsis Labs: _0 (procalcitonin:4,lacticidven:4)  )No results found for this or any previous visit (from the past 240 hour(s)).       Radiology Studies: No results found.      Scheduled Meds: . chlorhexidine  15 mL Mouth Rinse BID  . Chlorhexidine Gluconate Cloth  6 each  Topical Daily  . Chlorhexidine Gluconate Cloth  6 each Topical Q0600  . diltiazem  30 mg Per Tube Q6H  . feeding supplement (PRO-STAT SUGAR FREE 64)  30 mL Oral BID  . gabapentin  300 mg Oral Daily  . insulin aspart  0-9 Units Subcutaneous TID WC  .  mouth rinse  15 mL Mouth Rinse q12n4p  . multivitamin  1 tablet Oral QHS  . pantoprazole  40 mg Oral Daily  . polyethylene glycol  17 g Oral Daily  . potassium chloride  40 mEq Oral Daily  . sevelamer carbonate  800 mg Oral TID WC  . sorbitol  20 mL Oral Daily   Continuous Infusions:   LOS: 26 days   The patient is critically ill with multiple organ systems failure and requires high complexity decision making for assessment and support, frequent evaluation and titration of therapies, application of advanced monitoring technologies and extensive interpretation of multiple databases. Critical Care Time devoted to patient care services described in this note  Time spent: 40 minutes     WOODS, Geraldo Docker, MD [facility] Pager [phone]  If 7PM-7AM, please contact night-coverage [email or web address] Password [facility] 10/04/2018, 7:40 AM

## 2018-10-05 LAB — CBC WITH DIFFERENTIAL/PLATELET
Abs Immature Granulocytes: 0.04 10*3/uL (ref 0.00–0.07)
Basophils Absolute: 0 10*3/uL (ref 0.0–0.1)
Basophils Relative: 0 %
Eosinophils Absolute: 0.6 10*3/uL — ABNORMAL HIGH (ref 0.0–0.5)
Eosinophils Relative: 8 %
HCT: 26.4 % — ABNORMAL LOW (ref 39.0–52.0)
Hemoglobin: 8.5 g/dL — ABNORMAL LOW (ref 13.0–17.0)
Immature Granulocytes: 1 %
Lymphocytes Relative: 14 %
Lymphs Abs: 1.1 10*3/uL (ref 0.7–4.0)
MCH: 32.7 pg (ref 26.0–34.0)
MCHC: 32.2 g/dL (ref 30.0–36.0)
MCV: 101.5 fL — ABNORMAL HIGH (ref 80.0–100.0)
Monocytes Absolute: 0.7 10*3/uL (ref 0.1–1.0)
Monocytes Relative: 8 %
Neutro Abs: 5.5 10*3/uL (ref 1.7–7.7)
Neutrophils Relative %: 69 %
Platelets: 76 10*3/uL — ABNORMAL LOW (ref 150–400)
RBC: 2.6 MIL/uL — ABNORMAL LOW (ref 4.22–5.81)
RDW: 18.6 % — ABNORMAL HIGH (ref 11.5–15.5)
WBC: 7.9 10*3/uL (ref 4.0–10.5)
nRBC: 0 % (ref 0.0–0.2)

## 2018-10-05 LAB — GLUCOSE, CAPILLARY
Glucose-Capillary: 97 mg/dL (ref 70–99)
Glucose-Capillary: 151 mg/dL — ABNORMAL HIGH (ref 70–99)
Glucose-Capillary: 116 mg/dL — ABNORMAL HIGH (ref 70–99)
Glucose-Capillary: 148 mg/dL — ABNORMAL HIGH (ref 70–99)

## 2018-10-05 LAB — LIPID PANEL
Cholesterol: 159 mg/dL (ref 0–200)
HDL: 47 mg/dL (ref >40)
LDL Cholesterol: 86 mg/dL (ref 0–99)
Total CHOL/HDL Ratio: 3.4 RATIO
Triglycerides: 131 mg/dL (ref <150)
VLDL: 26 mg/dL (ref 0–40)

## 2018-10-05 LAB — RENAL FUNCTION PANEL
Albumin: 2.5 g/dL — ABNORMAL LOW (ref 3.5–5.0)
Anion gap: 9 (ref 5–15)
BUN: 49 mg/dL — ABNORMAL HIGH (ref 8–23)
CO2: 28 mmol/L (ref 22–32)
Calcium: 8.4 mg/dL — ABNORMAL LOW (ref 8.9–10.3)
Chloride: 100 mmol/L (ref 98–111)
Creatinine, Ser: 2.53 mg/dL — ABNORMAL HIGH (ref 0.61–1.24)
GFR calc Af Amer: 29 mL/min — ABNORMAL LOW (ref >60)
GFR calc non Af Amer: 25 mL/min — ABNORMAL LOW (ref >60)
Glucose, Bld: 104 mg/dL — ABNORMAL HIGH (ref 70–99)
Phosphorus: 3.5 mg/dL (ref 2.5–4.6)
Potassium: 3.7 mmol/L (ref 3.5–5.1)
Sodium: 137 mmol/L (ref 135–145)

## 2018-10-05 LAB — HEMOGLOBIN A1C
Hgb A1c MFr Bld: 4.9 % (ref 4.8–5.6)
Mean Plasma Glucose: 93.93 mg/dL

## 2018-10-05 LAB — LACTATE DEHYDROGENASE: LDH: 289 U/L — ABNORMAL HIGH (ref 98–192)

## 2018-10-05 LAB — MAGNESIUM: Magnesium: 1.6 mg/dL — ABNORMAL LOW (ref 1.7–2.4)

## 2018-10-05 MED ORDER — MAGNESIUM SULFATE IN D5W 1-5 GM/100ML-% IV SOLN
1.0000 g | Freq: Once | INTRAVENOUS | Status: AC
  Administered 2018-10-05: 09:00:00 1 g via INTRAVENOUS
  Filled 2018-10-05: qty 100

## 2018-10-05 MED ORDER — ATORVASTATIN CALCIUM 40 MG PO TABS
40.0000 mg | ORAL_TABLET | Freq: Every day | ORAL | Status: DC
  Administered 2018-10-05 – 2018-10-13 (×9): 40 mg via ORAL
  Filled 2018-10-05 (×9): qty 1

## 2018-10-05 MED ORDER — GERHARDT'S BUTT CREAM
TOPICAL_CREAM | Freq: Every day | CUTANEOUS | Status: DC
  Administered 2018-10-05 – 2018-10-14 (×10): via TOPICAL
  Filled 2018-10-05 (×2): qty 1

## 2018-10-05 MED ORDER — SODIUM CHLORIDE 0.9 % IV SOLN
INTRAVENOUS | Status: DC
  Administered 2018-10-05 (×2): via INTRAVENOUS

## 2018-10-05 NOTE — Progress Notes (Signed)
[facility]    NEPHROLOGY PROGRESS NOTE  SUBJECTIVE: Patient had 2 liters UOP over 6/24.   He had HD on 6/32 with no UF charted.  He feels well today.    Review of systems:  No shortness of breath  No chest pain No nausea or vomiting    OBJECTIVE:  Vitals:   10/05/18 0300 10/05/18 0404  BP:  128/81  Pulse: 88 99  Resp: 12 13  Temp:  (!) 97.5 F (36.4 C)  SpO2: 94% 95%    Intake/Output Summary (Last 24 hours) at 10/05/2018 6269 Last data filed at 10/05/2018 0500 Gross per 24 hour  Intake 1968.91 ml  Output 2140 ml  Net -171.09 ml      General:  AAOx3 NAD  HEENT: MMM [place] AT anicteric sclera CV:  Heart RRR  Lungs:  Clear and unlabored  Abd:  abd SNT/ND with normal BS Extremities: no edema  GU foley in place  Access:  Right IJ nontunneled catheter   MEDICATIONS:  . chlorhexidine  15 mL Mouth Rinse BID  . Chlorhexidine Gluconate Cloth  6 each Topical Daily  . Chlorhexidine Gluconate Cloth  6 each Topical Q0600  . diltiazem  30 mg Per Tube Q6H  . enoxaparin (LOVENOX) injection  40 mg Subcutaneous Q24H  . gabapentin  300 mg Oral Daily  . insulin aspart  0-9 Units Subcutaneous TID WC  . multivitamin  1 tablet Oral QHS  . pantoprazole  40 mg Oral Daily  . polyethylene glycol  17 g Oral Daily  . sorbitol  20 mL Oral Daily       LABS:   CBC Latest Ref Rng & Units 10/05/2018 10/04/2018 10/03/2018  WBC 4.0 - 10.5 K/uL 7.9 8.0 9.3  Hemoglobin 13.0 - 17.0 g/dL 8.5(L) 9.3(L) 9.8(L)  Hematocrit 39.0 - 52.0 % 26.4(L) 29.3(L) 29.8(L)  Platelets 150 - 400 K/uL 76(L) 70(L) 65(L)    CMP Latest Ref Rng & Units 10/05/2018 10/04/2018 10/03/2018  Glucose 70 - 99 mg/dL 104(H) 109(H) 127(H)  BUN 8 - 23 mg/dL 49(H) 47(H) 110(H)  Creatinine 0.61 - 1.24 mg/dL 2.53(H) 2.21(H) 3.93(H)  Sodium 135 - 145 mmol/L 137 139 135  Potassium 3.5 - 5.1 mmol/L 3.7 4.1 3.8  Chloride 98 - 111 mmol/L 100 99 89(L)  CO2 22 - 32 mmol/L 28 30 32  Calcium 8.9 - 10.3 mg/dL 8.4(L) 8.9  8.8(L)  Total Protein 6.5 - 8.1 g/dL - - -  Total Bilirubin 0.3 - 1.2 mg/dL - - -  Alkaline Phos 38 - 126 U/L - - -  AST 15 - 41 U/L - - -  ALT 0 - 44 U/L - - -    Lab Results  Component Value Date   PTH 96 (H) 09/25/2018   CALCIUM 8.4 (L) 10/05/2018   CAION 1.19 09/20/2016   PHOS 3.5 10/05/2018       Component Value Date/Time   COLORURINE YELLOW 09/18/2018 1302   APPEARANCEUR CLOUDY (A) 09/18/2018 1302   LABSPEC 1.011 09/18/2018 1302   PHURINE 5.0 09/18/2018 1302   GLUCOSEU >=500 (A) 09/18/2018 1302   HGBUR MODERATE (A) 09/18/2018 1302   BILIRUBINUR NEGATIVE 09/18/2018 1302   KETONESUR NEGATIVE 09/18/2018 1302   PROTEINUR NEGATIVE 09/18/2018 1302   UROBILINOGEN 0.2 01/31/2010 0624   NITRITE NEGATIVE 09/18/2018 1302   LEUKOCYTESUR NEGATIVE 09/18/2018 1302      Component Value Date/Time   PHART 7.255 (L) 09/17/2018 0317   PCO2ART 39.0 09/17/2018 0317   PO2ART 66.3 (  L) 09/17/2018 0317   HCO3 16.7 (L) 09/17/2018 0317   TCO2 29 09/20/2016 0657   ACIDBASEDEF 9.2 (H) 09/17/2018 0317   O2SAT 90.7 09/17/2018 0317       Component Value Date/Time   IRON 88 07/24/2018 1230   TIBC 240 07/24/2018 1230   FERRITIN 3,307 (H) 09/08/2018 1047   IRONPCTSAT 37 07/24/2018 1230       ASSESSMENT/PLAN:     68 year old male patient who presented with respiratory failure and fever secondary to presumed pneumonia.  He was started on empiric antibiotics.  COVID-19 was negative.  His respiratory status worsened requiring mechanical ventilation and CRRT for acute renal failure.  Then transitioned to IHD as needed.  Also note hx enlarged prostate s/p prostatectomy (2008 per chart review).  GU hx also with transurethral resection of bladder tumor 2018 per chart review    1.  Acute kidney injury. Nonoliguric and resolving and may be 2/2 ATN.  Renal ultrasound from 09/18/2018 was negative for hydronephrosis. - Had HD on 6/23 for clearance after a long pause since 6/15.    - No acute need for HD    - normal saline at 75 ml/hr  - Remove foley and monitor for retention.  In/out cath x 1 if PVR is over 300 and if needs multiple in/out caths will need to replace foley.  Needs strict ins/outs  - Will plan for removal of non-tunneled dialysis catheter tomorrow, 6/26 if labs are stable   2.  Chronic kidney disease stage III.  His baseline serum creatinine runs around 1.4-1.5 per charting.  CKD felt 2/2 DM.  His urine is negative for protein.    PTH 96 from 09/25/2018.    3.  Hyperphosphatemia.  Resolved following HD.  Continue renal diet for now.  I have discontinued renvela with overall improving renal function  4.  Anemia.  Hemoglobin is stable; no acute need for PRBC's.  aranesp 40 mcg once 6/23.  5.  Pneumonia.  Respiratory status is improving.   6. Thrombocytopenia - improving.  Hem/onc following.    Claudia Desanctis 10/05/2018 7:27 AM

## 2018-10-05 NOTE — Progress Notes (Signed)
So far, everything seems to be progressing slowly but surely.  He is doing some physical therapy.  I know that he is very motivated to do physical therapy.  He gets dialysis.  He is making urine.  His BUN and creatinine today are 49 and 2.53.  His CBC shows a white cell count is 7.9.  Hemoglobin 8.5.  Platelet count 76,000.  The LDH is 289.  I did speak to his wife last night about the bone marrow biopsy results.  I told her that there is no cancer in the bone marrow.  It looks like there is just changes from his chemotherapy that he has had.  His appetite is doing okay.  It still is marginal.  Hopefully this will improve.  There really is no change in the physical exam.  His vital signs show temperature 97.5.  Pulse 99.  Blood pressure 128/81.  On the cardiac monitor, he still has what looks like normal sinus rhythm.  I do not anticipate any problems with thrombocytopenia.  Hopefully, the platelets will continue to improve.  I do appreciate everybody's help.  I know this is a tough situation.  Thankfully, [name] is motivated to improve and get out of the hospital.  [name], MD  Jeneen Rinks 4:10

## 2018-10-05 NOTE — Progress Notes (Signed)
PROGRESS NOTE    [name] Rose  KDX:[id number] DOB: [birth year] DOA: 09/08/2018 PCP: Shirline Frees, MD   Brief Narrative:  68 y.o. 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    Patient presented secondary to significant weakness overnight.  He reports no associated symptoms.  Weakness occurred acutely as he felt normal the day prior.  He reports receiving chemotherapy on a 2-week on 1 week off regimen.  Last round of chemotherapy was just over 1-1/2 weeks ago.  Patient reports not generally feeling this week with chemotherapy.  Weakness has improved slightly since coming to the hospital. Admitted 09/08/2018 with probable viral pneumonia-coronavirus 192 negative in addition to hypotension and AKI creatinine up to 2.3 from 1.7 baseline     Subjective: 6/25 A/O x4, negative CP, negative S OB, negative abdominal pain.  Sitting in chair comfortably requesting daily PT.     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 Harrie Jeans nephrology still unsure if patient will be totally HD dependent.  Dependent upon patient's U OP will determine if patient requires HD tomorrow and in the next couple of days will determine if patient will be permanent HD patient -Strict in and out -29.7 L -Daily weight Filed Weights   10/03/18 1506 10/04/18 0300  10/05/18 0404  Weight: 100.4 kg 100.5 kg 101.5 kg   Recent Labs  Lab 10/01/18 0535 10/02/18 0500 10/03/18 0548 10/04/18 0500 10/05/18 0411  CREATININE 4.50* 4.29* 3.93* 2.21* 2.53*  - Creatinine improving currently no need for urgent HD  Persistent atrial fibrillation -CHAD2VASC2 = 3 - Currently rate controlled - Cardizem 30 mg every 6 hours -Lovenox 40 mg daily - Labetalol PRN -Metoprolol PRN  Essential HTN -See A. Fib  Hypomagnesmia - Magnesium goal> 2 - Magnesium IV 1 g  Metastatic bladder and prostate cancer - Oncology [name] managing - 6/16 bone marrow biopsy left ilium negative cancer cells see results below - Wife instructed by RN to contact oncology for results - Patient unsure of last chemo dose and cannot tell by current oncology notes when last chemo regimen administered nor what future plans are we will have to do more research  Diabetes type 2 controlled with complication - 9/76 hemoglobin A1c= 4.9 - Sensitive SSI  HLD - LDL not within ADA/AHA guidelines - 6/25 start Lipitor 40 mg daily - LDL goal<70   Viral pneumonia? -Supportive care -Patient received small dose of antibiotics but discontinued. - BAL 6/2 showed Candida albicans, not treated  Dysphasia oral pharyngeal - Resolved - Renal/carb modified  Deconditioning - Ambulate patient daily      DVT prophylaxis: Lovenox Code Status: Full Family Communication: None Disposition Plan: TBD   Consultants:  Nephrology Oncology    Procedures/Significant Events:  6/16:Bone Marrow, Aspirate,Biopsy, left ilium - MILDLY HYPERCELLULAR MARROW WITH MEGAKARYOCYTIC HYPOPLASIA AND DYSPLASIA. - he marrow is mildly  hypercellular. Megakaryocytes are reduced and dysplastic. There is very mild dysplasia in the erythroid cells. Overall, these changes are suggestive of a low-grade myelodysplastic syndrome, although non-neoplastic causes of dysplasia should be excluded. Correlation with FISH is  recommended.   I have personally reviewed and interpreted all radiology studies and my findings are as above.  VENTILATOR SETTINGS:    Cultures 5/29 blood right Port-A-Cath negative 5/29 blood left antecubital negative 5/29 urine insignificant growth 5/29 MRSA by PCR negative 6/2 BAL positive Candida albicans     Antimicrobials: Anti-infectives (From admission, onward)   Start     Stop   09/15/18 0600  ceFEPIme (MAXIPIME) 2 g in sodium chloride 0.9 % 100 mL IVPB  Status:  Discontinued     09/16/18 0957   09/11/18 1200  sulfamethoxazole-trimethoprim (BACTRIM) 560 mg in dextrose 5 % 500 mL IVPB  Status:  Discontinued     09/13/18 1309   09/10/18 1800  azithromycin (ZITHROMAX) 500 mg in sodium chloride 0.9 % 250 mL IVPB  Status:  Discontinued     09/16/18 0957   09/09/18 1800  vancomycin (VANCOCIN) 1,250 mg in sodium chloride 0.9 % 250 mL IVPB  Status:  Discontinued     09/08/18 1744   09/08/18 1800  vancomycin (VANCOCIN) 2,000 mg in sodium chloride 0.9 % 500 mL IVPB  Status:  Discontinued     09/08/18 1744   09/08/18 1800  ceFEPIme (MAXIPIME) 2 g in sodium chloride 0.9 % 100 mL IVPB  Status:  Discontinued     09/14/18 0816   09/08/18 0500  vancomycin (VANCOCIN) IVPB 1000 mg/200 mL premix     09/08/18 0612   09/08/18 0500  ceFEPIme (MAXIPIME) 2 g in sodium chloride 0.9 % 100 mL IVPB     09/08/18 0544       Devices    LINES / TUBES:      Continuous Infusions: . sodium chloride       Objective: Vitals:   10/04/18 2300 10/04/18 2337 10/05/18 0300 10/05/18 0404  BP:  132/81  128/81  Pulse: 98 99 88 99  Resp: (!) 25 (!) _0 Temp:  (!) 97.5 F (36.4 C)  (!) 97.5 F (36.4 C)  TempSrc:  Axillary  Axillary  SpO2: 94% 96% 94% 95%  Weight:    101.5 kg  Height:        Intake/Output Summary (Last 24 hours) at 10/05/2018 0810 Last data filed at 10/05/2018 0500 Gross per 24 hour  Intake 1728.91 ml  Output 2140 ml  Net -411.09 ml   Filed Weights    10/03/18 1506 10/04/18 0300 10/05/18 0404  Weight: 100.4 kg 100.5 kg 101.5 kg   Physical Exam:  General: A/O x4 no acute respiratory distress Eyes: negative scleral hemorrhage, negative anisocoria, negative icterus ENT: Negative Runny nose, negative gingival bleeding, Neck:  Negative scars, masses, torticollis, lymphadenopathy, JVD right IJ Vas-Cath in place negative bleeding/sign of infection Lungs: Clear to auscultation bilaterally without wheezes or crackles Cardiovascular: Regular rate and rhythm without murmur gallop or rub normal S1 and S2 Abdomen: negative abdominal pain, nondistended, positive soft, bowel sounds, no rebound, no ascites, no appreciable mass Extremities: No significant cyanosis, clubbing, or edema bilateral lower extremities Skin: Negative rashes, lesions, ulcers Psychiatric:  Negative depression, negative anxiety, negative fatigue, negative mania  Central nervous system:  Cranial nerves II through XII intact, tongue/uvula midline, all extremities muscle strength 5/5, sensation intact throughout,  negative dysarthria, negative expressive aphasia, negative receptive aphasia.  Data Reviewed: Care during the described time interval was provided by me .  I have reviewed this patient's available data, including medical history, events of note, physical examination, and all test results as part of my evaluation.   CBC: Recent Labs  Lab 10/01/18 0535 10/02/18 0500 10/03/18 0548 10/04/18 0500 10/05/18 0411  WBC 8.8 9.1 9.3 8.0 7.9  NEUTROABS  --  7.3 7.2 5.8 5.5  HGB 10.0* 9.9* 9.8* 9.3* 8.5*  HCT 30.7* 30.4* 29.8* 29.3* 26.4*  MCV 99.7 99.7 99.7 102.4* 101.5*  PLT 52* 56* 65* 70* 76*   Basic Metabolic Panel: Recent Labs  Lab 10/01/18 0535 10/02/18 0500 10/03/18 0548 10/04/18 0500 10/05/18 0411  NA 136 135 135 139 137  K 2.9* 3.8 3.8 4.1 3.7  CL 86* 87* 89* 99 100  CO2 35* 33* 32 30 28  GLUCOSE 130* 131* 127* 109* 104*  BUN 113* 115* 110* 47*  49*  CREATININE 4.50* 4.29* 3.93* 2.21* 2.53*  CALCIUM 8.1* 8.6* 8.8* 8.9 8.4*  MG 1.6* 2.2  --   --  1.6*  PHOS 7.1* 6.5* 6.0* 3.6 3.5   GFR: Estimated Creatinine Clearance: 36 mL/min (A) (by C-G formula based on SCr of 2.53 mg/dL (H)). Liver Function Tests: Recent Labs  Lab 09/29/18 1010  10/01/18 0535 10/02/18 0500 10/03/18 0548 10/04/18 0500 10/05/18 0411  AST 42*  --   --   --   --   --   --   ALT 43  --   --   --   --   --   --   ALKPHOS 59  --   --   --   --   --   --   BILITOT 0.8  --   --   --   --   --   --   PROT 4.8*  --   --   --   --   --   --   ALBUMIN 2.4*   < > 2.7* 2.8* 2.8* 2.7* 2.5*   < > = values in this interval not displayed.   No results for input(s): LIPASE, AMYLASE in the last 168 hours. No results for input(s): AMMONIA in the last 168 hours. Coagulation Profile: No results for input(s): INR, PROTIME in the last 168 hours. Cardiac Enzymes: No results for input(s): CKTOTAL, CKMB, CKMBINDEX, TROPONINI in the last 168 hours. BNP (last 3 results) No results for input(s): PROBNP in the last 8760 hours. HbA1C: Recent Labs    10/05/18 0412  HGBA1C 4.9   CBG: Recent Labs  Lab 10/04/18 0636 10/04/18 1136 10/04/18 1641 10/04/18 2112 10/05/18 0615  GLUCAP 103* 163* 108* 137* 97   Lipid Profile: Recent Labs    10/05/18 0411  CHOL 159  HDL 47  LDLCALC 86  TRIG 131  CHOLHDL 3.4   Thyroid Function Tests: No results for input(s): TSH, T4TOTAL, FREET4, T3FREE, THYROIDAB in the last 72 hours. Anemia Panel: No results for input(s): VITAMINB12, FOLATE, FERRITIN, TIBC, IRON, RETICCTPCT in the last 72 hours. Urine analysis:    Component Value Date/Time   COLORURINE YELLOW 09/18/2018 1302   APPEARANCEUR CLOUDY (A) 09/18/2018 1302   LABSPEC 1.011 09/18/2018 1302   PHURINE 5.0 09/18/2018 1302   GLUCOSEU >=500 (A) 09/18/2018 1302   HGBUR MODERATE (A) 09/18/2018 1302   BILIRUBINUR NEGATIVE 09/18/2018 1302   KETONESUR NEGATIVE 09/18/2018 1302    PROTEINUR NEGATIVE 09/18/2018 1302   UROBILINOGEN 0.2 01/31/2010 0624   NITRITE NEGATIVE 09/18/2018 1302  LEUKOCYTESUR NEGATIVE 09/18/2018 1302   Sepsis Labs: _0 (procalcitonin:4,lacticidven:4)  )No results found for this or any previous visit (from the past 240 hour(s)).       Radiology Studies: No results found.      Scheduled Meds: . chlorhexidine  15 mL Mouth Rinse BID  . Chlorhexidine Gluconate Cloth  6 each Topical Daily  . Chlorhexidine Gluconate Cloth  6 each Topical Q0600  . diltiazem  30 mg Per Tube Q6H  . enoxaparin (LOVENOX) injection  40 mg Subcutaneous Q24H  . gabapentin  300 mg Oral Daily  . insulin aspart  0-9 Units Subcutaneous TID WC  . multivitamin  1 tablet Oral QHS  . pantoprazole  40 mg Oral Daily  . polyethylene glycol  17 g Oral Daily  . sorbitol  20 mL Oral Daily   Continuous Infusions: . sodium chloride       LOS: 27 days   The patient is critically ill with multiple organ systems failure and requires high complexity decision making for assessment and support, frequent evaluation and titration of therapies, application of advanced monitoring technologies and extensive interpretation of multiple databases. Critical Care Time devoted to patient care services described in this note  Time spent: 40 minutes     Jacci Ruberg, Geraldo Docker, MD [facility] Pager [phone]  If 7PM-7AM, please contact night-coverage [email or web address] Password [email or web address] 10/05/2018, 8:10 AM

## 2018-10-05 NOTE — Progress Notes (Signed)
Goals are set.  Living Will in place.  Patient improving.  Symptoms well managed by primary team.  PMT will sign off for now.  Please call us back if we can be of assistance with this very nice family.  Thank you for the consultation.  Florentina Jenny, PA-C Palliative Medicine Pager: [phone]  No charge note.]

## 2018-10-06 DIAGNOSIS — J9602 Acute respiratory failure with hypercapnia: Secondary | ICD-10-CM

## 2018-10-06 DIAGNOSIS — E876 Hypokalemia: Secondary | ICD-10-CM

## 2018-10-06 DIAGNOSIS — R29898 Other symptoms and signs involving the musculoskeletal system: Secondary | ICD-10-CM

## 2018-10-06 LAB — CBC WITH DIFFERENTIAL/PLATELET
Abs Immature Granulocytes: 0.04 10*3/uL (ref 0.00–0.07)
Basophils Absolute: 0 10*3/uL (ref 0.0–0.1)
Basophils Relative: 0 %
Eosinophils Absolute: 0.5 10*3/uL (ref 0.0–0.5)
Eosinophils Relative: 6 %
HCT: 26.4 % — ABNORMAL LOW (ref 39.0–52.0)
Hemoglobin: 8.6 g/dL — ABNORMAL LOW (ref 13.0–17.0)
Immature Granulocytes: 1 %
Lymphocytes Relative: 11 %
Lymphs Abs: 0.9 10*3/uL (ref 0.7–4.0)
MCH: 32.5 pg (ref 26.0–34.0)
MCHC: 32.6 g/dL (ref 30.0–36.0)
MCV: 99.6 fL (ref 80.0–100.0)
Monocytes Absolute: 0.6 10*3/uL (ref 0.1–1.0)
Monocytes Relative: 7 %
Neutro Abs: 6.1 10*3/uL (ref 1.7–7.7)
Neutrophils Relative %: 75 %
Platelets: 90 10*3/uL — ABNORMAL LOW (ref 150–400)
RBC: 2.65 MIL/uL — ABNORMAL LOW (ref 4.22–5.81)
RDW: 18.4 % — ABNORMAL HIGH (ref 11.5–15.5)
WBC: 8.1 10*3/uL (ref 4.0–10.5)
nRBC: 0 % (ref 0.0–0.2)

## 2018-10-06 LAB — GLUCOSE, CAPILLARY
Glucose-Capillary: 115 mg/dL — ABNORMAL HIGH (ref 70–99)
Glucose-Capillary: 128 mg/dL — ABNORMAL HIGH (ref 70–99)
Glucose-Capillary: 126 mg/dL — ABNORMAL HIGH (ref 70–99)
Glucose-Capillary: 183 mg/dL — ABNORMAL HIGH (ref 70–99)

## 2018-10-06 LAB — RENAL FUNCTION PANEL
Albumin: 2.6 g/dL — ABNORMAL LOW (ref 3.5–5.0)
Anion gap: 12 (ref 5–15)
BUN: 51 mg/dL — ABNORMAL HIGH (ref 8–23)
CO2: 25 mmol/L (ref 22–32)
Calcium: 8.5 mg/dL — ABNORMAL LOW (ref 8.9–10.3)
Chloride: 99 mmol/L (ref 98–111)
Creatinine, Ser: 2.55 mg/dL — ABNORMAL HIGH (ref 0.61–1.24)
GFR calc Af Amer: 29 mL/min — ABNORMAL LOW (ref >60)
GFR calc non Af Amer: 25 mL/min — ABNORMAL LOW (ref >60)
Glucose, Bld: 112 mg/dL — ABNORMAL HIGH (ref 70–99)
Phosphorus: 3.2 mg/dL (ref 2.5–4.6)
Potassium: 3.4 mmol/L — ABNORMAL LOW (ref 3.5–5.1)
Sodium: 136 mmol/L (ref 135–145)

## 2018-10-06 LAB — MAGNESIUM: Magnesium: 1.6 mg/dL — ABNORMAL LOW (ref 1.7–2.4)

## 2018-10-06 LAB — LACTATE DEHYDROGENASE: LDH: 308 U/L — ABNORMAL HIGH (ref 98–192)

## 2018-10-06 MED ORDER — POTASSIUM CHLORIDE CRYS ER 20 MEQ PO TBCR
50.0000 meq | EXTENDED_RELEASE_TABLET | Freq: Once | ORAL | Status: AC
  Administered 2018-10-06: 50 meq via ORAL
  Filled 2018-10-06: qty 2

## 2018-10-06 MED ORDER — MAGNESIUM SULFATE 2 GM/50ML IV SOLN
2.0000 g | Freq: Once | INTRAVENOUS | Status: AC
  Administered 2018-10-06: 2 g via INTRAVENOUS
  Filled 2018-10-06: qty 50

## 2018-10-06 NOTE — Progress Notes (Signed)
[name]    NEPHROLOGY PROGRESS NOTE  SUBJECTIVE: Patient had 1.5 liters UOP over 6/25.  He last had HD on 6/23 with no UF.  His foley was removed yesterday and per nursing they have been checking for residuals and he has been urinating well.  Per nursing his PVR this AM was zero - she will chart.  (had a pre-void scan of 300 - to clarify this is pre-void)  Review of systems: No shortness of breath  No chest pain No nausea or vomiting    OBJECTIVE:  Vitals:   10/06/18 0305 10/06/18 0736  BP: (!) 103/92 (!) 158/82  Pulse: 98 (!) 120  Resp: 19 (!) 30  Temp: 98.8 F (37.1 C) 98.4 F (36.9 C)  SpO2: 97% 96%    Intake/Output Summary (Last 24 hours) at 10/06/2018 1024 Last data filed at 10/06/2018 0400 Gross per 24 hour  Intake 1847.04 ml  Output 1175 ml  Net 672.04 ml      General:  AAOx3 NAD HEENT: MMM [place] AT anicteric sclera CV:  Heart RRR  Lungs:  Clear and unlabored  Abd:  abd SNT/ND with normal BS Extremities: no edema  GU foley in place  Access:  Right IJ nontunneled catheter   MEDICATIONS:  . atorvastatin  40 mg Oral q1800  . chlorhexidine  15 mL Mouth Rinse BID  . Chlorhexidine Gluconate Cloth  6 each Topical Daily  . Chlorhexidine Gluconate Cloth  6 each Topical Q0600  . diltiazem  30 mg Per Tube Q6H  . enoxaparin (LOVENOX) injection  40 mg Subcutaneous Q24H  . gabapentin  300 mg Oral Daily  . Gerhardt's butt cream   Topical Daily  . insulin aspart  0-9 Units Subcutaneous TID WC  . multivitamin  1 tablet Oral QHS  . pantoprazole  40 mg Oral Daily  . polyethylene glycol  17 g Oral Daily  . potassium chloride  50 mEq Oral Once  . sorbitol  20 mL Oral Daily       LABS:   CBC Latest Ref Rng & Units 10/06/2018 10/05/2018 10/04/2018  WBC 4.0 - 10.5 K/uL 8.1 7.9 8.0  Hemoglobin 13.0 - 17.0 g/dL 8.6(L) 8.5(L) 9.3(L)  Hematocrit 39.0 - 52.0 % 26.4(L) 26.4(L) 29.3(L)  Platelets 150 - 400 K/uL 90(L) 76(L) 70(L)    CMP Latest Ref Rng & Units  10/06/2018 10/05/2018 10/04/2018  Glucose 70 - 99 mg/dL 112(H) 104(H) 109(H)  BUN 8 - 23 mg/dL 51(H) 49(H) 47(H)  Creatinine 0.61 - 1.24 mg/dL 2.55(H) 2.53(H) 2.21(H)  Sodium 135 - 145 mmol/L 136 137 139  Potassium 3.5 - 5.1 mmol/L 3.4(L) 3.7 4.1  Chloride 98 - 111 mmol/L 99 100 99  CO2 22 - 32 mmol/L $RemoveB'25 28 30  'anISRUtt$ Calcium 8.9 - 10.3 mg/dL 8.5(L) 8.4(L) 8.9  Total Protein 6.5 - 8.1 g/dL - - -  Total Bilirubin 0.3 - 1.2 mg/dL - - -  Alkaline Phos 38 - 126 U/L - - -  AST 15 - 41 U/L - - -  ALT 0 - 44 U/L - - -    Lab Results  Component Value Date   PTH 96 (H) 09/25/2018   CALCIUM 8.5 (L) 10/06/2018   CAION 1.19 09/20/2016   PHOS 3.2 10/06/2018       Component Value Date/Time   COLORURINE YELLOW 09/18/2018 1302   APPEARANCEUR CLOUDY (A) 09/18/2018 1302   LABSPEC 1.011 09/18/2018 1302   PHURINE 5.0 09/18/2018 1302   GLUCOSEU >=500 (A) 09/18/2018  [place] (A) 09/18/2018 1302   BILIRUBINUR NEGATIVE 09/18/2018 1302   KETONESUR NEGATIVE 09/18/2018 1302   PROTEINUR NEGATIVE 09/18/2018 1302   UROBILINOGEN 0.2 01/31/2010 0624   NITRITE NEGATIVE 09/18/2018 1302   LEUKOCYTESUR NEGATIVE 09/18/2018 1302      Component Value Date/Time   PHART 7.255 (L) 09/17/2018 0317   PCO2ART 39.0 09/17/2018 0317   PO2ART 66.3 (L) 09/17/2018 0317   HCO3 16.7 (L) 09/17/2018 0317   TCO2 29 09/20/2016 0657   ACIDBASEDEF 9.2 (H) 09/17/2018 0317   O2SAT 90.7 09/17/2018 0317       Component Value Date/Time   IRON 88 07/24/2018 1230   TIBC 240 07/24/2018 1230   FERRITIN 3,307 (H) 09/08/2018 1047   IRONPCTSAT 37 07/24/2018 1230       ASSESSMENT/PLAN:     68 year old male patient who presented with respiratory failure and fever secondary to presumed pneumonia.  He was started on empiric antibiotics.  COVID-19 was negative.  His respiratory status worsened requiring mechanical ventilation and CRRT for acute renal failure.  Then transitioned to IHD as needed.  Also note hx enlarged prostate s/p  prostatectomy (2008 per chart review).  GU hx also with transurethral resection of bladder tumor 2018 per chart review    1.  Acute kidney injury. Nonoliguric and resolving and may be 2/2 ATN.  Renal ultrasound from 09/18/2018 was negative for hydronephrosis. - Had HD on 6/23 for clearance after a long pause since 6/15.    - No acute need for HD.  Will request for removal of non-tunneled dialysis catheter as we do not anticipate further HD - Will stop IV fluids  - Continue strict ins/outs   2.  Chronic kidney disease stage III.  His baseline serum creatinine runs around 1.4-1.5 per charting.  CKD felt 2/2 DM.  His urine is negative for protein.    PTH 96 from 09/25/2018.    3.  Hyperphosphatemia.  Resolved following HD.  Continue renal diet for now.  I have discontinued renvela with overall improving renal function  4.  Anemia.  Hemoglobin is stable; no acute need for PRBC's. s/p aranesp 40 mcg once 6/23.  5.  Pneumonia.  Respiratory status is improving.   6. Thrombocytopenia - improving.  Hem/onc following.   Updated his wife via phone   Claudia Desanctis 10/06/2018 10:24 AM

## 2018-10-06 NOTE — Care Management Important Message (Signed)
Important Message  Patient Details  Name: [name] Rose MRN: [id number] Date of Birth: 1951-03-24   Medicare Important Message Given:  Yes     Memory Argue 10/06/2018, 3:21 PM

## 2018-10-06 NOTE — Progress Notes (Signed)
Physical Therapy Treatment Patient Details Name: [name] Rose MRN: [id number] DOB: [birth year] Today's Date: 10/06/2018    History of Present Illness Pt is a 68 y/o male presenting with resiratory failure and fever secondary to presumed pneumonia.  Intubated 6/05/18/08, CRRT while in ICU.  Course complicated by anemia and atrial fib with RVR. PMH: prostate cancer with bladder mets, chemo induced mucositits, CKD, HTN, hyperglycemia.     PT Comments    Pt quickly fatigued during preparation to stand in the STEDY.  VSS initially, but once pt in the STEDY and working to stand his EHR rose into the 150's.  Sats remained at 93% on room air, but as pt got more anxious sitting in the STEDY, 4L [place] applied.   Follow Up Recommendations  CIR     Equipment Recommendations  (TBA)    Recommendations for Other Services Rehab consult     Precautions / Restrictions Precautions Precautions: Fall    Mobility  Bed Mobility               General bed mobility comments: already lifted to the recliner via maximovel lift  Transfers Overall transfer level: Needs assistance   Transfers: Sit to/from Stand           General transfer comment: 2 trials of standing in the STEDY. Pt couldnt get his hips high enough to clear the seat without difficulty  Ambulation/Gait                 Stairs             Wheelchair Mobility    Modified Rankin (Stroke Patients Only)       Balance   Sitting-balance support: Bilateral upper extremity supported;Feet supported       Standing balance support: During functional activity;Bilateral upper extremity supported Standing balance-Leahy Scale: Zero Standing balance comment: 2 trials of 2 persons assisting with pad to increase lift.  It was difficult to get the seat in place and once in place, pt was very anxious sitting up on STEDY seat.  HR rose to 150's and SpO2 was 93, but RR was in the 40's and 50s.                               Cognition Arousal/Alertness: Awake/alert Behavior During Therapy: WFL for tasks assessed/performed Overall Cognitive Status: (NT formally)                                        Exercises General Exercises - Lower Extremity Hip Flexion/Marching: AROM;Strengthening;10 reps;Seated;Both    General Comments General comments (skin integrity, edema, etc.): Repositioning in the recliner was a 2 person maximal effort.      Pertinent Vitals/Pain Faces Pain Scale: Hurts little more Pain Location: L knee Pain Descriptors / Indicators: Aching;Grimacing;Guarding Pain Intervention(s): Monitored during session    Home Living                      Prior Function            PT Goals (current goals can now be found in the care plan section) Acute Rehab PT Goals Patient Stated Goal: to get stronger PT Goal Formulation: With patient Time For Goal Achievement: 10/10/18 Potential to Achieve Goals: Fair Progress towards PT goals: Progressing toward goals    Frequency  Min 3X/week      PT Plan Current plan remains appropriate    Co-evaluation              AM-PAC PT "6 Clicks" Mobility   Outcome Measure  Help needed turning from your back to your side while in a flat bed without using bedrails?: A Lot Help needed moving from lying on your back to sitting on the side of a flat bed without using bedrails?: A Lot Help needed moving to and from a bed to a chair (including a wheelchair)?: Total Help needed standing up from a chair using your arms (e.g., wheelchair or bedside chair)?: Total Help needed to walk in hospital room?: Total Help needed climbing 3-5 steps with a railing? : Total 6 Click Score: 8    End of Session     Patient left: in chair;with call bell/phone within reach;with chair alarm set Nurse Communication: Mobility status;Need for lift equipment PT Visit Diagnosis: Unsteadiness on feet (R26.81);Other abnormalities of gait and  mobility (R26.89);Muscle weakness (generalized) (M62.81)     Time: 2924-4628 PT Time Calculation (min) (ACUTE ONLY): 27 min  Charges:  $Therapeutic Activity: 23-37 mins                     10/06/2018  Donnella Sham, PT Acute Rehabilitation Services [phone]  (pager) [phone]  (office)   Tessie Fass Maxie Debose 10/06/2018, 2:20 PM

## 2018-10-06 NOTE — Progress Notes (Signed)
PROGRESS NOTE    [name] Rose  TKP:[id number] DOB: Sep 14, 1950 DOA: 09/08/2018 PCP: Shirline Frees, MD   Brief Narrative:  68 y.o. 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    Patient presented secondary to significant weakness overnight.  He reports no associated symptoms.  Weakness occurred acutely as he felt normal the day prior.  He reports receiving chemotherapy on a 2-week on 1 week off regimen.  Last round of chemotherapy was just over 1-1/2 weeks ago.  Patient reports not generally feeling this week with chemotherapy.  Weakness has improved slightly since coming to the hospital. Admitted 09/08/2018 with probable viral pneumonia-coronavirus 192 negative in addition to hypotension and AKI creatinine up to 2.3 from 1.7 baseline     Subjective: 6/26 A/O x4, negative CP, negative S OB, negative abdominal pain.  Sitting in chair comfortably.   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 Harrie Jeans nephrology does not anticipate patient requiring HD. -Placed order for IV team to remove RIGHT IJ Vas-Cath.  -Strict in and out -22.2 L -Daily weight Filed Weights   10/04/18 0300 10/05/18 0404 10/06/18 0324  Weight: 100.5 kg 101.5 kg 103.6 kg   Recent Labs  Lab 10/02/18 0500 10/03/18 0548 10/04/18 0500 10/05/18  0411 10/06/18 0315  CREATININE 4.29* 3.93* 2.21* 2.53* 2.55*  - 6/26 creatinine stable believe we can remove HD cath, but will wait for nephrology evaluation.  Persistent atrial fibrillation -CHAD2VASC2 = 3 - Currently rate controlled - Cardizem 30 mg every 6 hours -Lovenox 40 mg daily  Labetalol PRN -Metoprolol PRN  Essential HTN -See A. Fib  Hypokalemia - Potassium goal> 4 - Potassium p.o. 50 mEq  Hypomagnesmia - Magnesium goal> 2 - Magnesium IV 2 g  Metastatic bladder and prostate cancer - Oncology [name] managing - 6/16 bone marrow biopsy left ilium negative cancer cells see results below - Per [name] note spoke with wife concerning future oncology issues.    Diabetes type 2 controlled with complication - 5/17 hemoglobin A1c= 4.9 - Sensitive SSI  HLD - LDL not within ADA/AHA guidelines - Lipitor 40 mg daily - LDL goal<70  Viral pneumonia? -Supportive care -Patient received small dose of antibiotics but discontinued. - BAL 6/2 showed Candida albicans, not treated  Dysphasia oral pharyngeal - Resolved - Renal/carb modified  Severe muscle deconditioning - Ambulate patient daily      DVT prophylaxis: Lovenox Code Status: Full Family Communication: None Disposition Plan: TBD   Consultants:  Nephrology Oncology    Procedures/Significant Events:  6/16:Bone Marrow, Aspirate,Biopsy, left ilium - MILDLY HYPERCELLULAR MARROW WITH MEGAKARYOCYTIC HYPOPLASIA AND DYSPLASIA. - he marrow is mildly hypercellular. Megakaryocytes are reduced and dysplastic. There is very mild dysplasia in the erythroid cells. Overall, these changes are suggestive of a low-grade myelodysplastic syndrome, although non-neoplastic causes of dysplasia should be excluded.  Correlation with FISH is recommended.   I have personally reviewed and interpreted all radiology studies and my findings are as above.  VENTILATOR SETTINGS:    Cultures 5/29 blood right  Port-A-Cath negative 5/29 blood left antecubital negative 5/29 urine insignificant growth 5/29 MRSA by PCR negative 6/2 BAL positive Candida albicans     Antimicrobials: Anti-infectives (From admission, onward)   Start     Stop   09/15/18 0600  ceFEPIme (MAXIPIME) 2 g in sodium chloride 0.9 % 100 mL IVPB  Status:  Discontinued     09/16/18 0957   09/11/18 1200  sulfamethoxazole-trimethoprim (BACTRIM) 560 mg in dextrose 5 % 500 mL IVPB  Status:  Discontinued     09/13/18 1309   09/10/18 1800  azithromycin (ZITHROMAX) 500 mg in sodium chloride 0.9 % 250 mL IVPB  Status:  Discontinued     09/16/18 0957   09/09/18 1800  vancomycin (VANCOCIN) 1,250 mg in sodium chloride 0.9 % 250 mL IVPB  Status:  Discontinued     09/08/18 1744   09/08/18 1800  vancomycin (VANCOCIN) 2,000 mg in sodium chloride 0.9 % 500 mL IVPB  Status:  Discontinued     09/08/18 1744   09/08/18 1800  ceFEPIme (MAXIPIME) 2 g in sodium chloride 0.9 % 100 mL IVPB  Status:  Discontinued     09/14/18 0816   09/08/18 0500  vancomycin (VANCOCIN) IVPB 1000 mg/200 mL premix     09/08/18 0612   09/08/18 0500  ceFEPIme (MAXIPIME) 2 g in sodium chloride 0.9 % 100 mL IVPB     09/08/18 0544       Devices    LINES / TUBES:      Continuous Infusions:    Objective: Vitals:   10/06/18 0305 10/06/18 0324 10/06/18 0736 10/06/18 1026  BP: (!) 103/92  (!) 158/82 119/83  Pulse: 98  (!) 120 (!) 114  Resp: 19  (!) 30 (!) 33  Temp: 98.8 F (37.1 C)  98.4 F (36.9 C) 98 F (36.7 C)  TempSrc: Oral  Oral Oral  SpO2: 97%  96% 97%  Weight:  103.6 kg    Height:        Intake/Output Summary (Last 24 hours) at 10/06/2018 1219 Last data filed at 10/06/2018 0800 Gross per 24 hour  Intake 1847.04 ml  Output 1275 ml  Net 572.04 ml   Filed Weights   10/04/18 0300 10/05/18 0404 10/06/18 0324  Weight: 100.5 kg 101.5 kg 103.6 kg   Physical Exam:  General: A/O x4 no acute respiratory distress, extremely weak Eyes: negative  scleral hemorrhage, negative anisocoria, negative icterus ENT: Negative Runny nose, negative gingival bleeding, Neck:  Negative scars, masses, torticollis, lymphadenopathy, JVD, RIGHT IJ Vas-Cath in place negative bleeding/sign of infection Lungs: Clear to auscultation bilaterally without wheezes or crackles Cardiovascular: Regular rate and rhythm without murmur gallop or rub normal S1 and S2 Abdomen: negative abdominal pain, nondistended, positive soft, bowel sounds, no rebound, no ascites, no appreciable mass Extremities: No significant cyanosis, clubbing, or edema bilateral lower extremities Skin: Negative rashes, lesions, ulcers Psychiatric:  Negative depression, negative anxiety, negative fatigue, negative mania  Central nervous system:  Cranial nerves II through XII intact, tongue/uvula midline, all extremities muscle strength 5/5, sensation intact throughout,negative dysarthria, negative expressive aphasia, negative receptive aphasia.       Data Reviewed: Care during the described time interval was provided by me .  I have reviewed this patient's available data, including medical history, events of note, physical examination, and  all test results as part of my evaluation.   CBC: Recent Labs  Lab 10/02/18 0500 10/03/18 0548 10/04/18 0500 10/05/18 0411 10/06/18 0315  WBC 9.1 9.3 8.0 7.9 8.1  NEUTROABS 7.3 7.2 5.8 5.5 6.1  HGB 9.9* 9.8* 9.3* 8.5* 8.6*  HCT 30.4* 29.8* 29.3* 26.4* 26.4*  MCV 99.7 99.7 102.4* 101.5* 99.6  PLT 56* 65* 70* 76* 90*   Basic Metabolic Panel: Recent Labs  Lab 10/01/18 0535 10/02/18 0500 10/03/18 0548 10/04/18 0500 10/05/18 0411 10/06/18 0315  NA 136 135 135 139 137 136  K 2.9* 3.8 3.8 4.1 3.7 3.4*  CL 86* 87* 89* 99 100 99  CO2 35* 33* 32 _0 GLUCOSE 130* 131* 127* 109* 104* 112*  BUN 113* 115* 110* 47* 49* 51*  CREATININE 4.50* 4.29* 3.93* 2.21* 2.53* 2.55*  CALCIUM 8.1* 8.6* 8.8* 8.9 8.4* 8.5*  MG 1.6* 2.2  --   --  1.6* 1.6*   PHOS 7.1* 6.5* 6.0* 3.6 3.5 3.2   GFR: Estimated Creatinine Clearance: 36.1 mL/min (A) (by C-G formula based on SCr of 2.55 mg/dL (H)). Liver Function Tests: Recent Labs  Lab 10/02/18 0500 10/03/18 0548 10/04/18 0500 10/05/18 0411 10/06/18 0315  ALBUMIN 2.8* 2.8* 2.7* 2.5* 2.6*   No results for input(s): LIPASE, AMYLASE in the last 168 hours. No results for input(s): AMMONIA in the last 168 hours. Coagulation Profile: No results for input(s): INR, PROTIME in the last 168 hours. Cardiac Enzymes: No results for input(s): CKTOTAL, CKMB, CKMBINDEX, TROPONINI in the last 168 hours. BNP (last 3 results) No results for input(s): PROBNP in the last 8760 hours. HbA1C: Recent Labs    10/05/18 0412  HGBA1C 4.9   CBG: Recent Labs  Lab 10/05/18 1105 10/05/18 1538 10/05/18 2129 10/06/18 0613 10/06/18 1116  GLUCAP 151* 116* 148* 115* 128*   Lipid Profile: Recent Labs    10/05/18 0411  CHOL 159  HDL 47  LDLCALC 86  TRIG 131  CHOLHDL 3.4   Thyroid Function Tests: No results for input(s): TSH, T4TOTAL, FREET4, T3FREE, THYROIDAB in the last 72 hours. Anemia Panel: No results for input(s): VITAMINB12, FOLATE, FERRITIN, TIBC, IRON, RETICCTPCT in the last 72 hours. Urine analysis:    Component Value Date/Time   COLORURINE YELLOW 09/18/2018 1302   APPEARANCEUR CLOUDY (A) 09/18/2018 1302   LABSPEC 1.011 09/18/2018 1302   PHURINE 5.0 09/18/2018 1302   GLUCOSEU >=500 (A) 09/18/2018 1302   HGBUR MODERATE (A) 09/18/2018 1302   BILIRUBINUR NEGATIVE 09/18/2018 1302   KETONESUR NEGATIVE 09/18/2018 1302   PROTEINUR NEGATIVE 09/18/2018 1302   UROBILINOGEN 0.2 01/31/2010 0624   NITRITE NEGATIVE 09/18/2018 1302   LEUKOCYTESUR NEGATIVE 09/18/2018 1302   Sepsis Labs: _1 (procalcitonin:4,lacticidven:4)  )No results found for this or any previous visit (from the past 240 hour(s)).       Radiology Studies: No results found.      Scheduled Meds: . atorvastatin  40  mg Oral q1800  . chlorhexidine  15 mL Mouth Rinse BID  . Chlorhexidine Gluconate Cloth  6 each Topical Daily  . Chlorhexidine Gluconate Cloth  6 each Topical Q0600  . diltiazem  30 mg Per Tube Q6H  . enoxaparin (LOVENOX) injection  40 mg Subcutaneous Q24H  . gabapentin  300 mg Oral Daily  . Gerhardt's butt cream   Topical Daily  . insulin aspart  0-9 Units Subcutaneous TID WC  . multivitamin  1 tablet Oral QHS  . pantoprazole  40 mg Oral Daily  .  polyethylene glycol  17 g Oral Daily  . sorbitol  20 mL Oral Daily   Continuous Infusions:    LOS: 28 days   The patient is critically ill with multiple organ systems failure and requires high complexity decision making for assessment and support, frequent evaluation and titration of therapies, application of advanced monitoring technologies and extensive interpretation of multiple databases. Critical Care Time devoted to patient care services described in this note  Time spent: 40 minutes     Valene Villa, Geraldo Docker, MD [facility] Pager [phone]  If 7PM-7AM, please contact night-coverage [email or web address] Password [facility] 10/06/2018, 12:19 PM

## 2018-10-06 NOTE — Progress Notes (Signed)
Inpatient Rehabilitation-Admissions Coordinator   Noted pt is progressing medically. AC will continue to follow pt for progress with therapies. Per PT/OT note from 6/24, the pt's HR was up to 150s-160s during activity- feel it is not currently appropriate for pt to be off telemetry and admitted to CIR. Per PM&R physician Dr. Naaman Plummer, the pt's HR would need to be more controlled with activity.   Please call if questions.   [name], OTR/L  Rehab Admissions Coordinator  [phone] 10/06/2018 1:23 PM

## 2018-10-06 NOTE — Progress Notes (Signed)
[name] Rose is doing okay.  He really wants to do physical therapy.  He is incredibly motivated to get out of bed and walk.  It would be nice if physical therapy will be able to do that for him.  I am sure that they will will do a fantastic job with him.  I am not sure if he is going to go to rehab or not.  He has not had dialysis for several days.  His renal function today shows a BUN of 51 creatinine 2.55.  He is making urine.  His platelet count is coming up nicely.  Today's platelet count is 90,000.  His hemoglobin is 8.6 and white cell count 8.1.  His appetite is doing okay.  He has had no diarrhea.  He has had no vomiting.  There is no shortness of breath.  His vital signs are temperature of 98.8.  Pulse 98.  Blood pressure 103/92.  Oxygen saturation is 97%.  Head neck exam shows no ocular or oral lesions.  He has no thrush.  Lungs are clear bilaterally.  Cardiac exam regular rate and rhythm.  There are no murmurs.  Abdomen is soft.  Bowel sounds are present.  Extremities shows no clubbing, cyanosis or edema.  I am just happy that he has not had dialysis for 3 days.  His BUN and creatinine are looking good.  His platelet count is coming up.  I think the main goal now is for rehab.  Again he is incredibly motivated to get well and get out of bed and to walk.  I appreciate everybody's help with him on 2C.  It is obvious he is getting incredibly good care.  [name], MD  Nehemiah 8:10

## 2018-10-07 LAB — LACTATE DEHYDROGENASE: LDH: 315 U/L — ABNORMAL HIGH (ref 98–192)

## 2018-10-07 LAB — GLUCOSE, CAPILLARY
Glucose-Capillary: 160 mg/dL — ABNORMAL HIGH (ref 70–99)
Glucose-Capillary: 131 mg/dL — ABNORMAL HIGH (ref 70–99)
Glucose-Capillary: 146 mg/dL — ABNORMAL HIGH (ref 70–99)
Glucose-Capillary: 187 mg/dL — ABNORMAL HIGH (ref 70–99)

## 2018-10-07 LAB — CBC WITH DIFFERENTIAL/PLATELET
Abs Immature Granulocytes: 0.12 10*3/uL — ABNORMAL HIGH (ref 0.00–0.07)
Basophils Absolute: 0 10*3/uL (ref 0.0–0.1)
Basophils Relative: 0 %
Eosinophils Absolute: 0 10*3/uL (ref 0.0–0.5)
Eosinophils Relative: 0 %
HCT: 27.8 % — ABNORMAL LOW (ref 39.0–52.0)
Hemoglobin: 9.1 g/dL — ABNORMAL LOW (ref 13.0–17.0)
Immature Granulocytes: 1 %
Lymphocytes Relative: 4 %
Lymphs Abs: 0.5 10*3/uL — ABNORMAL LOW (ref 0.7–4.0)
MCH: 32.7 pg (ref 26.0–34.0)
MCHC: 32.7 g/dL (ref 30.0–36.0)
MCV: 100 fL (ref 80.0–100.0)
Monocytes Absolute: 1 10*3/uL (ref 0.1–1.0)
Monocytes Relative: 8 %
Neutro Abs: 10.5 10*3/uL — ABNORMAL HIGH (ref 1.7–7.7)
Neutrophils Relative %: 87 %
Platelets: 100 10*3/uL — ABNORMAL LOW (ref 150–400)
RBC: 2.78 MIL/uL — ABNORMAL LOW (ref 4.22–5.81)
RDW: 18.6 % — ABNORMAL HIGH (ref 11.5–15.5)
WBC: 12.2 10*3/uL — ABNORMAL HIGH (ref 4.0–10.5)
nRBC: 0 % (ref 0.0–0.2)

## 2018-10-07 LAB — RENAL FUNCTION PANEL
Albumin: 2.5 g/dL — ABNORMAL LOW (ref 3.5–5.0)
Anion gap: 10 (ref 5–15)
BUN: 47 mg/dL — ABNORMAL HIGH (ref 8–23)
CO2: 26 mmol/L (ref 22–32)
Calcium: 8.6 mg/dL — ABNORMAL LOW (ref 8.9–10.3)
Chloride: 99 mmol/L (ref 98–111)
Creatinine, Ser: 2.49 mg/dL — ABNORMAL HIGH (ref 0.61–1.24)
GFR calc Af Amer: 30 mL/min — ABNORMAL LOW (ref >60)
GFR calc non Af Amer: 26 mL/min — ABNORMAL LOW (ref >60)
Glucose, Bld: 153 mg/dL — ABNORMAL HIGH (ref 70–99)
Phosphorus: 3.1 mg/dL (ref 2.5–4.6)
Potassium: 3.9 mmol/L (ref 3.5–5.1)
Sodium: 135 mmol/L (ref 135–145)

## 2018-10-07 LAB — MAGNESIUM: Magnesium: 1.7 mg/dL (ref 1.7–2.4)

## 2018-10-07 NOTE — Progress Notes (Signed)
[facility]    NEPHROLOGY PROGRESS NOTE  SUBJECTIVE: Patient had 1.3 liters UOP over 6/25.  He last had HD on 6/23 with no UF.  He states that he has been urinating without difficulty.  vascath was removed yesterday  Review of systems: Denies shortness of breath  No n/v No chest pain  Slept ok    OBJECTIVE:  Vitals:   10/07/18 0300 10/07/18 0425  BP:  125/85  Pulse: (!) 123 (!) 120  Resp: (!) 33 20  Temp:  98.2 F (36.8 C)  SpO2: 95% 95%    Intake/Output Summary (Last 24 hours) at 10/07/2018 1700 Last data filed at 10/07/2018 0600 Gross per 24 hour  Intake 480 ml  Output 1250 ml  Net -770 ml      General:  AAOx3 NAD  HEENT: MMM [place] AT anicteric sclera CV:  Tachycardia  Lungs:  Clear and unlabored at rest; on 1.5 liters oxygen  Abd:  abd SNT/ND with normal BS Extremities: no edema  GU no foley  Access: bandage over old RIJ nontunneled catheter site  MEDICATIONS:  . atorvastatin  40 mg Oral q1800  . chlorhexidine  15 mL Mouth Rinse BID  . Chlorhexidine Gluconate Cloth  6 each Topical Daily  . Chlorhexidine Gluconate Cloth  6 each Topical Q0600  . diltiazem  30 mg Per Tube Q6H  . enoxaparin (LOVENOX) injection  40 mg Subcutaneous Q24H  . gabapentin  300 mg Oral Daily  . Gerhardt's butt cream   Topical Daily  . insulin aspart  0-9 Units Subcutaneous TID WC  . multivitamin  1 tablet Oral QHS  . pantoprazole  40 mg Oral Daily  . polyethylene glycol  17 g Oral Daily  . sorbitol  20 mL Oral Daily       LABS:   CBC Latest Ref Rng & Units 10/07/2018 10/06/2018 10/05/2018  WBC 4.0 - 10.5 K/uL 12.2(H) 8.1 7.9  Hemoglobin 13.0 - 17.0 g/dL 9.1(L) 8.6(L) 8.5(L)  Hematocrit 39.0 - 52.0 % 27.8(L) 26.4(L) 26.4(L)  Platelets 150 - 400 K/uL 100(L) 90(L) 76(L)    CMP Latest Ref Rng & Units 10/07/2018 10/06/2018 10/05/2018  Glucose 70 - 99 mg/dL 153(H) 112(H) 104(H)  BUN 8 - 23 mg/dL 47(H) 51(H) 49(H)  Creatinine 0.61 - 1.24 mg/dL 2.49(H) 2.55(H) 2.53(H)   Sodium 135 - 145 mmol/L 135 136 137  Potassium 3.5 - 5.1 mmol/L 3.9 3.4(L) 3.7  Chloride 98 - 111 mmol/L 99 99 100  CO2 22 - 32 mmol/L $RemoveB'26 25 28  'CjKyJcAI$ Calcium 8.9 - 10.3 mg/dL 8.6(L) 8.5(L) 8.4(L)  Total Protein 6.5 - 8.1 g/dL - - -  Total Bilirubin 0.3 - 1.2 mg/dL - - -  Alkaline Phos 38 - 126 U/L - - -  AST 15 - 41 U/L - - -  ALT 0 - 44 U/L - - -    Lab Results  Component Value Date   PTH 96 (H) 09/25/2018   CALCIUM 8.6 (L) 10/07/2018   CAION 1.19 09/20/2016   PHOS 3.1 10/07/2018       Component Value Date/Time   COLORURINE YELLOW 09/18/2018 1302   APPEARANCEUR CLOUDY (A) 09/18/2018 1302   LABSPEC 1.011 09/18/2018 1302   PHURINE 5.0 09/18/2018 1302   GLUCOSEU >=500 (A) 09/18/2018 1302   HGBUR MODERATE (A) 09/18/2018 1302   BILIRUBINUR NEGATIVE 09/18/2018 1302   KETONESUR NEGATIVE 09/18/2018 1302   PROTEINUR NEGATIVE 09/18/2018 1302   UROBILINOGEN 0.2 01/31/2010 0624   NITRITE NEGATIVE 09/18/2018 1302  LEUKOCYTESUR NEGATIVE 09/18/2018 1302      Component Value Date/Time   PHART 7.255 (L) 09/17/2018 0317   PCO2ART 39.0 09/17/2018 0317   PO2ART 66.3 (L) 09/17/2018 0317   HCO3 16.7 (L) 09/17/2018 0317   TCO2 29 09/20/2016 0657   ACIDBASEDEF 9.2 (H) 09/17/2018 0317   O2SAT 90.7 09/17/2018 0317       Component Value Date/Time   IRON 88 07/24/2018 1230   TIBC 240 07/24/2018 1230   FERRITIN 3,307 (H) 09/08/2018 1047   IRONPCTSAT 37 07/24/2018 1230       ASSESSMENT/PLAN:     68 year old male patient who presented with respiratory failure and fever secondary to presumed pneumonia.  He was started on empiric antibiotics.  COVID-19 was negative.  His respiratory status worsened requiring mechanical ventilation and CRRT for acute renal failure.  Then transitioned to IHD as needed.  Also note hx enlarged prostate s/p prostatectomy (2008 per chart review).  GU hx also with transurethral resection of bladder tumor 2018 per chart review    1.  Acute kidney injury. Nonoliguric  and resolving and may be 2/2 ATN.  Renal ultrasound from 09/18/2018 was negative for hydronephrosis. - Had HD on 6/23 for clearance after a long pause since 6/15.    - Continue supportive care - Clearance is stable off of HD and his non-tunneled catheter is now out - Continue strict ins/outs  2.  Chronic kidney disease stage III.  His baseline serum creatinine runs around 1.4-1.5 per charting.  CKD felt 2/2 DM.  His urine is negative for protein.    PTH 96 from 09/25/2018.    3.  Hyperphosphatemia.  Resolved following HD.  I have discontinued renvela with overall improving renal function  4.  Anemia.  Hemoglobin is stable; no acute need for PRBC's.  Defer further ESA - metastatic bladder cancer.   5.  Pneumonia.  Respiratory status is improving.   6. Thrombocytopenia - improving.  Hem/onc following.    Claudia Desanctis 10/07/2018 7:07 AM

## 2018-10-07 NOTE — Progress Notes (Signed)
PROGRESS NOTE    [name] Rose  DVV:[id number] DOB: [birth year] DOA: 09/08/2018 PCP: Shirline Frees, MD   Brief Narrative:  68 y.o. 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    Patient presented secondary to significant weakness overnight.  He reports no associated symptoms.  Weakness occurred acutely as he felt normal the day prior.  He reports receiving chemotherapy on a 2-week on 1 week off regimen.  Last round of chemotherapy was just over 1-1/2 weeks ago.  Patient reports not generally feeling this week with chemotherapy.  Weakness has improved slightly since coming to the hospital. Admitted 09/08/2018 with probable viral pneumonia-coronavirus 192 negative in addition to hypotension and AKI creatinine up to 2.3 from 1.7 baseline     Subjective: 6/27 A/O x4, negative CP, negative S OB, negative abdominal pain.  Sitting in chair, fatigue today.  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 Harrie Jeans nephrology does not anticipate patient requiring HD. -RIGHT IJ Vas-Cath removed 6/26   -Strict in and out -17.5 L -Daily weight Filed Weights   10/05/18 0404 10/06/18 0324 10/07/18 0500  Weight: 101.5 kg 103.6 kg 101.6 kg   Recent Labs  Lab 10/03/18 0548 10/04/18 0500 10/05/18 0411 10/06/18 0315 10/07/18 0358   CREATININE 3.93* 2.21* 2.53* 2.55* 2.49*  - 6/27 Vas-Cath removed area covered and clean negative sign of bleeding or infection.    Persistent atrial fibrillation -CHAD2VASC2 = 3 - Rate controlled - Cardizem 30 mg every 6 hours -Lovenox 40 mg daily  Labetalol PRN -Metoprolol PRN  Essential HTN -See A. Fib  Hypokalemia - Potassium goal> 4  Hypomagnesmia - Magnesium goal> 2  Metastatic bladder and prostate cancer - Oncology [name] managing - 6/16 bone marrow biopsy left ilium negative cancer cells see results below - Per [name] note spoke with wife concerning future oncology issues.    Diabetes type 2 controlled with complication - 6/26 hemoglobin A1c= 4.9 - Sensitive SSI  HLD - LDL not within ADA/AHA guidelines - Lipitor 40 mg daily - LDL goal<70  Viral pneumonia? -Supportive care -Patient received small dose of antibiotics but discontinued. - BAL 6/2 showed Candida albicans, not treated  Dysphasia oral pharyngeal - Resolved - Renal/carb modified  Severe muscle deconditioning - Ambulate patient daily   Goals of care -6/27 consult to physical medicine for evaluation for CIR placement    DVT prophylaxis: Lovenox Code Status: Full Family Communication: None Disposition Plan: TBD   Consultants:  Nephrology Oncology    Procedures/Significant Events:  6/16:Bone Marrow, Aspirate,Biopsy, left ilium - MILDLY HYPERCELLULAR MARROW WITH MEGAKARYOCYTIC HYPOPLASIA AND DYSPLASIA. - he marrow is mildly hypercellular. Megakaryocytes are reduced and dysplastic. There is very mild dysplasia in the erythroid cells. Overall, these changes are suggestive of a low-grade myelodysplastic syndrome, although non-neoplastic causes of dysplasia should be excluded. Correlation with  FISH is recommended.   I have personally reviewed and interpreted all radiology studies and my findings are as above.  VENTILATOR SETTINGS:    Cultures 5/29 blood right Port-A-Cath  negative 5/29 blood left antecubital negative 5/29 urine insignificant growth 5/29 MRSA by PCR negative 6/2 BAL positive Candida albicans     Antimicrobials: Anti-infectives (From admission, onward)   Start     Stop   09/15/18 0600  ceFEPIme (MAXIPIME) 2 g in sodium chloride 0.9 % 100 mL IVPB  Status:  Discontinued     09/16/18 0957   09/11/18 1200  sulfamethoxazole-trimethoprim (BACTRIM) 560 mg in dextrose 5 % 500 mL IVPB  Status:  Discontinued     09/13/18 1309   09/10/18 1800  azithromycin (ZITHROMAX) 500 mg in sodium chloride 0.9 % 250 mL IVPB  Status:  Discontinued     09/16/18 0957   09/09/18 1800  vancomycin (VANCOCIN) 1,250 mg in sodium chloride 0.9 % 250 mL IVPB  Status:  Discontinued     09/08/18 1744   09/08/18 1800  vancomycin (VANCOCIN) 2,000 mg in sodium chloride 0.9 % 500 mL IVPB  Status:  Discontinued     09/08/18 1744   09/08/18 1800  ceFEPIme (MAXIPIME) 2 g in sodium chloride 0.9 % 100 mL IVPB  Status:  Discontinued     09/14/18 0816   09/08/18 0500  vancomycin (VANCOCIN) IVPB 1000 mg/200 mL premix     09/08/18 0612   09/08/18 0500  ceFEPIme (MAXIPIME) 2 g in sodium chloride 0.9 % 100 mL IVPB     09/08/18 0544       Devices    LINES / TUBES:  -RIGHT IJ Vas-Cath removed 6/26      Continuous Infusions:    Objective: Vitals:   10/06/18 2329 10/07/18 0300 10/07/18 0425 10/07/18 0500  BP: 127/80  125/85   Pulse: (!) 122 (!) 123 (!) 120   Resp: (!) 27 (!) 33 20   Temp: 99.1 F (37.3 C)  98.2 F (36.8 C)   TempSrc: Oral  Oral   SpO2: 95% 95% 95%   Weight:    101.6 kg  Height:        Intake/Output Summary (Last 24 hours) at 10/07/2018 0724 Last data filed at 10/07/2018 0600 Gross per 24 hour  Intake 480 ml  Output 1250 ml  Net -770 ml   Filed Weights   10/05/18 0404 10/06/18 0324 10/07/18 0500  Weight: 101.5 kg 103.6 kg 101.6 kg   Physical Exam:  General: A/O x4 no acute respiratory distress Eyes: negative scleral hemorrhage, negative  anisocoria, negative icterus ENT: Negative Runny nose, negative gingival bleeding, Neck:  Negative scars, masses, torticollis, lymphadenopathy, JVD Lungs: Clear to auscultation bilaterally without wheezes or crackles Cardiovascular: Regular rate and rhythm without murmur gallop or rub normal S1 and S2 Abdomen: negative abdominal pain, nondistended, positive soft, bowel sounds, no rebound, no ascites, no appreciable mass Extremities: No significant cyanosis, clubbing, or edema bilateral lower extremities Skin: Negative rashes, lesions, ulcers Psychiatric:  Negative depression, negative anxiety, negative fatigue, negative mania  Central nervous system:  Cranial nerves II through XII intact, tongue/uvula midline, all extremities muscle strength 5/5, sensation intact throughout,negative dysarthria, negative expressive aphasia, negative receptive aphasia.     Data Reviewed: Care during the described time interval was provided by me .  I have reviewed this patient's available data, including medical history, events of note, physical examination, and all test results as part of my evaluation.   CBC: Recent Labs  Lab 10/03/18 0548 10/04/18 0500 10/05/18 0411 10/06/18 0315 10/07/18 0358  WBC 9.3 8.0 7.9 8.1 12.2*  NEUTROABS 7.2 5.8 5.5 6.1 10.5*  HGB 9.8* 9.3* 8.5* 8.6* 9.1*  HCT 29.8* 29.3* 26.4* 26.4* 27.8*  MCV 99.7 102.4* 101.5* 99.6 100.0  PLT 65* 70* 76* 90* 570*   Basic Metabolic Panel: Recent Labs  Lab 10/01/18 0535 10/02/18 0500 10/03/18 0548 10/04/18 0500 10/05/18 0411 10/06/18 0315 10/07/18 0358  NA 136 135 135 139 137 136 135  K 2.9* 3.8 3.8 4.1 3.7 3.4* 3.9  CL 86* 87* 89* 99 100 99 99  CO2 35* 33* 32 _0 GLUCOSE 130* 131* 127* 109* 104* 112* 153*  BUN 113* 115* 110* 47* 49* 51* 47*  CREATININE 4.50* 4.29* 3.93* 2.21* 2.53* 2.55* 2.49*  CALCIUM 8.1* 8.6* 8.8* 8.9 8.4* 8.5* 8.6*  MG 1.6* 2.2  --   --  1.6* 1.6* 1.7  PHOS 7.1* 6.5* 6.0* 3.6 3.5 3.2 3.1    GFR: Estimated Creatinine Clearance: 36.6 mL/min (A) (by C-G formula based on SCr of 2.49 mg/dL (H)). Liver Function Tests: Recent Labs  Lab 10/03/18 0548 10/04/18 0500 10/05/18 0411 10/06/18 0315 10/07/18 0358  ALBUMIN 2.8* 2.7* 2.5* 2.6* 2.5*   No results for input(s): LIPASE, AMYLASE in the last 168 hours. No results for input(s): AMMONIA in the last 168 hours. Coagulation Profile: No results for input(s): INR, PROTIME in the last 168 hours. Cardiac Enzymes: No results for input(s): CKTOTAL, CKMB, CKMBINDEX, TROPONINI in the last 168 hours. BNP (last 3 results) No results for input(s): PROBNP in the last 8760 hours. HbA1C: Recent Labs    10/05/18 0412  HGBA1C 4.9   CBG: Recent Labs  Lab 10/06/18 0613 10/06/18 1116 10/06/18 1614 10/06/18 2111 10/07/18 0604  GLUCAP 115* 128* 126* 183* 160*   Lipid Profile: Recent Labs    10/05/18 0411  CHOL 159  HDL 47  LDLCALC 86  TRIG 131  CHOLHDL 3.4   Thyroid Function Tests: No results for input(s): TSH, T4TOTAL, FREET4, T3FREE, THYROIDAB in the last 72 hours. Anemia Panel: No results for input(s): VITAMINB12, FOLATE, FERRITIN, TIBC, IRON, RETICCTPCT in the last 72 hours. Urine analysis:    Component Value Date/Time   COLORURINE YELLOW 09/18/2018 1302   APPEARANCEUR CLOUDY (A) 09/18/2018 1302   LABSPEC 1.011 09/18/2018 1302   PHURINE 5.0 09/18/2018 1302   GLUCOSEU >=500 (A) 09/18/2018 1302   HGBUR MODERATE (A) 09/18/2018 1302   BILIRUBINUR NEGATIVE 09/18/2018 1302   KETONESUR NEGATIVE 09/18/2018 1302   PROTEINUR NEGATIVE 09/18/2018 1302   UROBILINOGEN 0.2 01/31/2010 0624   NITRITE NEGATIVE 09/18/2018 1302   LEUKOCYTESUR NEGATIVE 09/18/2018 1302   Sepsis Labs: _1 (procalcitonin:4,lacticidven:4)  )No results found for this or any previous visit (from the past 240 hour(s)).       Radiology Studies: No results found.      Scheduled Meds: . atorvastatin  40 mg Oral q1800  . chlorhexidine  15  mL Mouth Rinse BID  . Chlorhexidine Gluconate Cloth  6 each Topical Daily  . Chlorhexidine Gluconate Cloth  6 each Topical Q0600  . diltiazem  30 mg Per Tube Q6H  . enoxaparin (LOVENOX) injection  40 mg Subcutaneous Q24H  . gabapentin  300 mg Oral Daily  . Gerhardt's butt cream   Topical Daily  . insulin aspart  0-9 Units Subcutaneous TID WC  . multivitamin  1 tablet Oral QHS  . pantoprazole  40 mg Oral Daily  . polyethylene glycol  17 g Oral Daily  . sorbitol  20 mL Oral Daily   Continuous Infusions:    LOS: 29 days   The patient is critically ill with multiple organ systems failure and requires high complexity decision making for assessment and support, frequent evaluation and titration of therapies, application of advanced monitoring technologies and extensive interpretation of multiple databases. Critical Care Time devoted to patient care services described in this note  Time spent: 40 minutes     WOODS, Geraldo Docker, MD [facility] Pager [phone]  If 7PM-7AM, please contact night-coverage [email or web address] Password [facility] 10/07/2018, 7:24 AM

## 2018-10-08 DIAGNOSIS — R945 Abnormal results of liver function studies: Secondary | ICD-10-CM

## 2018-10-08 LAB — RENAL FUNCTION PANEL
Albumin: 2.2 g/dL — ABNORMAL LOW (ref 3.5–5.0)
Anion gap: 12 (ref 5–15)
BUN: 47 mg/dL — ABNORMAL HIGH (ref 8–23)
CO2: 25 mmol/L (ref 22–32)
Calcium: 8.1 mg/dL — ABNORMAL LOW (ref 8.9–10.3)
Chloride: 94 mmol/L — ABNORMAL LOW (ref 98–111)
Creatinine, Ser: 2.6 mg/dL — ABNORMAL HIGH (ref 0.61–1.24)
GFR calc Af Amer: 28 mL/min — ABNORMAL LOW (ref >60)
GFR calc non Af Amer: 24 mL/min — ABNORMAL LOW (ref >60)
Glucose, Bld: 128 mg/dL — ABNORMAL HIGH (ref 70–99)
Phosphorus: 3.5 mg/dL (ref 2.5–4.6)
Potassium: 3.5 mmol/L (ref 3.5–5.1)
Sodium: 131 mmol/L — ABNORMAL LOW (ref 135–145)

## 2018-10-08 LAB — CBC WITH DIFFERENTIAL/PLATELET
Abs Immature Granulocytes: 0.31 10*3/uL — ABNORMAL HIGH (ref 0.00–0.07)
Basophils Absolute: 0 10*3/uL (ref 0.0–0.1)
Basophils Relative: 0 %
Eosinophils Absolute: 0 10*3/uL (ref 0.0–0.5)
Eosinophils Relative: 0 %
HCT: 24.8 % — ABNORMAL LOW (ref 39.0–52.0)
Hemoglobin: 8.1 g/dL — ABNORMAL LOW (ref 13.0–17.0)
Immature Granulocytes: 3 %
Lymphocytes Relative: 5 %
Lymphs Abs: 0.5 10*3/uL — ABNORMAL LOW (ref 0.7–4.0)
MCH: 32.7 pg (ref 26.0–34.0)
MCHC: 32.7 g/dL (ref 30.0–36.0)
MCV: 100 fL (ref 80.0–100.0)
Monocytes Absolute: 1.5 10*3/uL — ABNORMAL HIGH (ref 0.1–1.0)
Monocytes Relative: 14 %
Neutro Abs: 8.6 10*3/uL — ABNORMAL HIGH (ref 1.7–7.7)
Neutrophils Relative %: 78 %
Platelets: 103 10*3/uL — ABNORMAL LOW (ref 150–400)
RBC: 2.48 MIL/uL — ABNORMAL LOW (ref 4.22–5.81)
RDW: 18.3 % — ABNORMAL HIGH (ref 11.5–15.5)
WBC: 11 10*3/uL — ABNORMAL HIGH (ref 4.0–10.5)
nRBC: 0 % (ref 0.0–0.2)

## 2018-10-08 LAB — IRON AND TIBC
Iron: 9 ug/dL — ABNORMAL LOW (ref 45–182)
Saturation Ratios: 5 % — ABNORMAL LOW (ref 17.9–39.5)
TIBC: 176 ug/dL — ABNORMAL LOW (ref 250–450)
UIBC: 167 ug/dL

## 2018-10-08 LAB — FERRITIN: Ferritin: 1669 ng/mL — ABNORMAL HIGH (ref 24–336)

## 2018-10-08 LAB — GLUCOSE, CAPILLARY
Glucose-Capillary: 111 mg/dL — ABNORMAL HIGH (ref 70–99)
Glucose-Capillary: 136 mg/dL — ABNORMAL HIGH (ref 70–99)
Glucose-Capillary: 140 mg/dL — ABNORMAL HIGH (ref 70–99)

## 2018-10-08 LAB — MAGNESIUM: Magnesium: 1.6 mg/dL — ABNORMAL LOW (ref 1.7–2.4)

## 2018-10-08 LAB — LACTATE DEHYDROGENASE: LDH: 287 U/L — ABNORMAL HIGH (ref 98–192)

## 2018-10-08 MED ORDER — MAGNESIUM SULFATE 2 GM/50ML IV SOLN
2.0000 g | Freq: Once | INTRAVENOUS | Status: AC
  Administered 2018-10-08: 09:00:00 2 g via INTRAVENOUS
  Filled 2018-10-08: qty 50

## 2018-10-08 MED ORDER — POTASSIUM CHLORIDE CRYS ER 20 MEQ PO TBCR
50.0000 meq | EXTENDED_RELEASE_TABLET | Freq: Once | ORAL | Status: AC
  Administered 2018-10-08: 50 meq via ORAL
  Filled 2018-10-08: qty 2

## 2018-10-08 MED ORDER — SODIUM CHLORIDE 0.9 % IV SOLN
INTRAVENOUS | Status: AC
  Administered 2018-10-08: 08:00:00 via INTRAVENOUS

## 2018-10-08 NOTE — TOC Initial Note (Signed)
Transition of Care [facility]) - Initial/Assessment Note    Patient Details  Name: [name] Rose MRN: [id number] Date of Birth: 10/22/1950  [facility]) CM/SW Contact:    Gelene Mink, [place] Phone Number: 10/08/2018, 5:12 PM  Clinical Narrative:       CSW met with the patient at bedside. CSW introduced herself and explained her role. CSW discussed the therapy recommendation and explained that the patient was no longer eligible for CIR. The patient is agreeable to go to SNF. He has never been before. The patient gave the CSW permission to contact his wife to help make the placement decision. CSW obtained permission to fax the patient out to facilities and provide bed offers.   CSW called and spoke with the patient's wife, Threasa Beards. CSW explained the change in therapy recommendation. She is agreeable to helping her husband make the SNF placement decision. CSW emailed the CMS SNF list to [place]. She stated she would contact the CSW with facility choices.   CSW will continue to follow.               Expected Discharge Plan: Skilled Nursing Facility Barriers to Discharge: Continued Medical Work up   Patient Goals and CMS Choice Patient states their goals for this hospitalization and ongoing recovery are:: Pt is agreeable to going to rehab CMS Medicare.gov Compare Post Acute Care list provided to:: Patient Choice offered to / list presented to : Patient  Expected Discharge Plan and Services Expected Discharge Plan: [place] In-house Referral: Clinical Social Work Discharge Planning Services: NA Post Acute Care Choice: [place] Living arrangements for the past 2 months: Single Family Home Expected Discharge Date: (unknown)               DME Arranged: N/A DME Agency: NA       HH Arranged: NA [place] Agency: NA        Prior Living Arrangements/Services Living arrangements for the past 2 months: Single Family Home Lives with::  Spouse Patient language and need for interpreter reviewed:: No Do you feel safe going back to the place where you live?: Yes      Need for Family Participation in Patient Care: No (Comment) Care giver support system in place?: Yes (comment)   Criminal Activity/Legal Involvement Pertinent to Current Situation/Hospitalization: No - Comment as needed  Activities of Daily Living Home Assistive Devices/Equipment: Eyeglasses ADL Screening (condition at time of admission) Patient's cognitive ability adequate to safely complete daily activities?: Yes Is the patient deaf or have difficulty hearing?: No Does the patient have difficulty seeing, even when wearing glasses/contacts?: No Does the patient have difficulty concentrating, remembering, or making decisions?: No Patient able to express need for assistance with ADLs?: Yes Does the patient have difficulty dressing or bathing?: Yes Independently performs ADLs?: No Communication: Independent Dressing (OT): Needs assistance Is this a change from baseline?: Change from baseline, expected to last >3 days Grooming: Needs assistance Is this a change from baseline?: Change from baseline, expected to last >3 days Feeding: Needs assistance Is this a change from baseline?: Change from baseline, expected to last >3 days Bathing: Needs assistance Is this a change from baseline?: Change from baseline, expected to last >3 days Toileting: Needs assistance Is this a change from baseline?: Change from baseline, expected to last >3days In/Out Bed: Needs assistance Is this a change from baseline?: Change from baseline, expected to last >3 days Walks in Home: Needs assistance Is this a change from  baseline?: Change from baseline, expected to last >3 days Does the patient have difficulty walking or climbing stairs?: Yes(secondary to weakness) Weakness of Legs: Both Weakness of Arms/Hands: Both  Permission Sought/Granted Permission sought to share  information with : Case Manager Permission granted to share information with : Yes, Verbal Permission Granted  Share Information with NAME: Kylie Gros  Permission granted to share info w AGENCY: All SNF  Permission granted to share info w Relationship: Spouse  Permission granted to share info w Contact Information: [phone]  Emotional Assessment Appearance:: Appears stated age Attitude/Demeanor/Rapport: Engaged Affect (typically observed): Calm Orientation: : Oriented to Self, Oriented to Place, Oriented to  Time, Oriented to Situation Alcohol / Substance Use: Not Applicable Psych Involvement: No (comment)  Admission diagnosis:  Atypical pneumonia [J18.9] Acute respiratory failure with hypoxia (HCC) [J96.01] Sepsis, due to unspecified organism, unspecified whether acute organ dysfunction present [facility]) [A41.9] Patient Active Problem List   Diagnosis Date Noted  . Severe muscle deconditioning 10/06/2018  . Chronic atrial fibrillation   . Acute renal failure with acute tubular necrosis superimposed on stage 3 chronic kidney disease ([place])   . Metastatic cancer ([place])   . Palliative care encounter   . Acute renal failure ([place])   . Acute respiratory failure with hypoxia ([place])   . Atypical pneumonia   . CKD (chronic kidney disease), stage III ([place]) 09/08/2018  . Mitral valve insufficiency 08/17/2018  . Drug rash   . Essential hypertension 06/23/2018  . Hyperglycemia 06/23/2018  . Mild hyperlipidemia 06/23/2018  . Mucositis due to chemotherapy 06/22/2018  . AKI (acute kidney injury) ([place]) 06/22/2018  . Pancytopenia ([place]) 06/22/2018  . Staphylococcus aureus bacteremia   . Neutropenic fever ([place]) 06/21/2018  . Pneumonia   . Thrombocytopenia ([place])   . PAF (paroxysmal atrial fibrillation) ([place])   . Abscess of abdominal cavity ([place]) 12/19/2017  . IDA (iron deficiency anemia) 09/08/2017  . 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 Anorexia 10/17/2016  . Diarrhea 10/17/2016  . LFT elevation 10/17/2016  . Dehydration 10/17/2016  . Fatigue 10/17/2016  . Hypocalcemia 10/17/2016  . Goals of care, counseling/discussion 09/30/2016  . 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 ([place]) 09/29/2016  . Cancer of dome of urinary bladder ([place]) 09/20/2016  . Postoperative anemia due to acute blood loss 03/04/2015  . Hypokalemia 03/04/2015  . 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/03/2015   PCP:  Shirline Frees, MD Pharmacy:   [id number] [place]Phone: [phone]: [address] [place]Phone: [phone]: [phone]    Social Determinants of Health (SDOH) Interventions    Readmission Risk Interventions Readmission Risk Prevention Plan 09/08/2018 06/28/2018  Transportation Screening Complete Complete  PCP or Specialist Appt within 3-5 Days - Complete  HRI or [place]- Complete  Social Work Consult for [place]sel[place] - Complete  Palliative Care Screening - Not Applicable  Medication Review (Press photographer Complete Complete  PCP or Specialist appointment within 3-5 days of discharge Not Complete -  PCP/Specialist Appt Not Complete comments not yet ready for d/c -  HRI or [place] Complete -  HRI or Home Care Consult Pt Refusal Comments no needs identified at this time -  SW Recovery Care/Counseling Consult Not Complete -  SW Consult Not  Complete Comments no needs identified at this time -  Palliative Care Screening Not Applicable -  [place] Not Applicable -  Some recent data might be hidden

## 2018-10-08 NOTE — NC FL2 (Signed)
[place] LEVEL OF CARE SCREENING TOOL     IDENTIFICATION  Patient Name: [name] Rose Birthdate: April 29, 1950 Sex: male Admission Date (Current Location): 09/08/2018  [facility] and [place] Number:  Herbalist and Address:  [facility] [place], [place] [address], [place], [place] [address]      Provider Number: [id number]  Attending Physician Name and Address:  Allie Bossier, MD  Relative Name and Phone Number:  N[place], Clardy, [phone]    Current Level of Care: Hospital Recommended Level of Care: [place] Prior Approval Number:    Date Approved/Denied:   PASRR Number:    Discharge Plan: SNF    Current Diagnoses: Patient Active Problem List   Diagnosis Date Noted  . Severe muscle deconditioning 10/06/2018  . Chronic atrial fibrillation   . Acute renal failure with acute tubular necrosis superimposed on stage 3 chronic kidney disease ([place])   . Metastatic cancer ([place])   . Palliative care encounter   . Acute renal failure ([place])   . Acute respiratory failure with hypoxia ([place])   . Atypical pneumonia   . CKD (chronic kidney disease), stage III ([place]) 09/08/2018  . Mitral valve insufficiency 08/17/2018  . Drug rash   . Essential hypertension 06/23/2018  . Hyperglycemia 06/23/2018  . Mild hyperlipidemia 06/23/2018  . Mucositis due to chemotherapy 06/22/2018  . AKI (acute kidney injury) ([place]) 06/22/2018  . Pancytopenia ([place]) 06/22/2018  . Staphylococcus aureus bacteremia   . Neutropenic fever ([place]) 06/21/2018  . Pneumonia   . Thrombocytopenia ([place])   . PAF (paroxysmal atrial fibrillation) ([place])   . Abscess of abdominal cavity ([place]) 12/19/2017  . IDA (iron deficiency anemia) 09/08/2017  . 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 Anorexia 10/17/2016  . Diarrhea 10/17/2016  . LFT elevation 10/17/2016  . Dehydration 10/17/2016  . Fatigue 10/17/2016  .  Hypocalcemia 10/17/2016  . Goals of care, counseling/discussion 09/30/2016  . 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 ([place]) 09/29/2016  . Cancer of dome of urinary bladder ([place]) 09/20/2016  . Postoperative anemia due to acute blood loss 03/04/2015  . Hypokalemia 03/04/2015  . 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/03/2015    Orientation RESPIRATION BLADDER Height & Weight     Self, Time, Situation, Place  Normal Continent Weight: 223 lb 8.7 oz (101.4 kg) Height:  $Remove'6\' 2"'[id number]$  (188 cm)  BEHAVIORAL SYMPTOMS/MOOD NEUROLOGICAL BOWEL NUTRITION STATUS      Continent Diet(renal/carb modified, thin liquids)  AMBULATORY STATUS COMMUNICATION OF NEEDS Skin   Extensive Assist Verbally Skin abrasions, Bruising, Surgical wounds(incision closed on buttocks (change every 5 days), brusing on buttocks, MASD on buttocks)                       Personal Care Assistance Level of Assistance  Bathing, Dressing, Feeding, Total care Bathing Assistance: Maximum assistance Feeding assistance: Independent Dressing Assistance: Maximum assistance Total Care Assistance: Limited assistance   Functional Limitations Info  Sight, Hearing, Speech Sight Info: Impaired(Wears glasses) Hearing Info: Adequate Speech Info: Adequate    SPECIAL CARE FACTORS FREQUENCY  PT (By licensed PT), OT (By licensed OT)     PT Frequency: 5x/wk OT Frequency: 5x/wk            Contractures Contractures Info: Not present    Additional Factors Info  Code Status, Allergies, Insulin Sliding Scale Code Status Info: Full Code Allergies Info: No Active Allergies  Insulin Sliding Scale Info: insulin aspart novolog 0-9 units 3x daily w/meals       Current Medications (10/08/2018):  This is the current hospital active medication list Current Facility-Administered Medications  Medication Dose Route Frequency Provider Last Rate Last Dose  . 0.9 %  sodium chloride infusion   Intravenous Continuous Claudia Desanctis,  MD 75 mL/hr at 10/08/18 0827    . acetaminophen (TYLENOL) tablet 650 mg  650 mg Per Tube Q6H PRN [name], MD   650 mg at 10/06/18 1345   Or  . acetaminophen (TYLENOL) suppository 650 mg  650 mg Rectal Q6H PRN [name], MD      . albuterol (PROVENTIL) (2.5 MG/3ML) 0.083% nebulizer solution 3 mL  3 mL Nebulization Q4H PRN Barb Merino, MD   3 mL at 09/20/18 1946  . atorvastatin (LIPITOR) tablet 40 mg  40 mg Oral q1800 Allie Bossier, MD   40 mg at 10/07/18 2130  . chlorhexidine (PERIDEX) 0.12 % solution 15 mL  15 mL Mouth Rinse BID [name], MD   15 mL at 10/08/18 0830  . Chlorhexidine Gluconate Cloth 2 % PADS 6 each  6 each Topical Daily [name], MD   6 each at 10/08/18 1307  . Chlorhexidine Gluconate Cloth 2 % PADS 6 each  6 each Topical Q0600 Claudia Desanctis, MD   6 each at 10/08/18 [phone]  . diltiazem (CARDIZEM) 10 mg/ml oral suspension 30 mg  30 mg Per Tube Q6H Isaiah Serge, NP   30 mg at 10/08/18 1300  . enoxaparin (LOVENOX) injection 40 mg  40 mg Subcutaneous Q24H Gardiner Barefoot, NP   40 mg at 10/07/18 2254  . gabapentin (NEURONTIN) capsule 300 mg  300 mg Oral Daily Claudia Desanctis, MD   300 mg at 10/08/18 0831  . Gerhardt's butt cream   Topical Daily Allie Bossier, MD      . insulin aspart (novoLOG) injection 0-9 Units  0-9 Units Subcutaneous TID WC Blount, Lolita Cram, NP   1 Units at 10/08/18 1200  . labetalol (NORMODYNE) injection 20 mg  20 mg Intravenous Q10 min PRN Erick Colace, NP   20 mg at 09/22/18 0604  . metoprolol tartrate (LOPRESSOR) injection 5 mg  5 mg Intravenous Q4H PRN [name], MD   5 mg at 10/08/18 0423  . multivitamin (RENA-VIT) tablet 1 tablet  1 tablet Oral Nile Riggs, MD   1 tablet at 10/07/18 2257  . ondansetron (ZOFRAN) injection 4 mg  4 mg Intravenous Q6H PRN Mariel Aloe, MD   4 mg at 09/21/18 0757  . pantoprazole (PROTONIX) EC tablet 40 mg  40 mg Oral Daily Mariel Aloe, MD   40 mg at 10/08/18 0830  . polyethylene  glycol (MIRALAX / GLYCOLAX) packet 17 g  17 g Oral Daily Nita Sells, MD   17 g at 10/07/18 1028  . senna-docusate (Senokot-S) tablet 2 tablet  2 tablet Oral QHS PRN Dellinger, Bobby Rumpf, PA-C      . sennosides (SENOKOT) 8.8 MG/5ML syrup 5 mL  5 mL Per Tube BID PRN Halford Chessman, Vineet, MD      . sodium chloride flush (NS) 0.9 % injection 10-40 mL  10-40 mL Intracatheter PRN Olalere, Adewale A, MD      . sorbitol 70 % solution 20 mL  20 mL Oral Daily Samtani, Jai-Gurmukh, MD   20 mL at 10/08/18 0830  . zolpidem (AMBIEN) tablet 5 mg  5  mg Oral QHS PRN Deterding, Guadelupe Sabin, MD   5 mg at 09/23/18 0003   Facility-Administered Medications Ordered in Other Encounters  Medication Dose Route Frequency Provider Last Rate Last Dose  . sodium chloride flush (NS) 0.9 % injection 10 mL  10 mL Intravenous PRN [place], Holli Humbles, NP   10 mL at 02/21/17 1038  . sodium chloride flush (NS) 0.9 % injection 10 mL  10 mL Intravenous PRN Volanda Napoleon, MD   10 mL at 07/10/18 5217     Discharge Medications: Please see discharge summary for a list of discharge medications.  Relevant Imaging Results:  Relevant Lab Results:   Additional Information SSN: [id number]  Azariel Banik B Lakrista Scaduto, LCSWA

## 2018-10-08 NOTE — Progress Notes (Signed)
[name]    NEPHROLOGY PROGRESS NOTE  SUBJECTIVE: Strict ins/outs are not available but only 350 mL and 1 void is charted.  He last had HD on 6/23 with no UF. He states that he has been urinating ok and that they did not save his urine.  Feels well.  Not eating well.   Review of systems:  Denies shortness of breath  No n/v No chest pain   OBJECTIVE:  Vitals:   10/08/18 0342 10/08/18 0434  BP: 117/65   Pulse: (!) 115 (!) 104  Resp: (!) 22 20  Temp: 98.8 F (37.1 C)   SpO2: 92% 94%    Intake/Output Summary (Last 24 hours) at 10/08/2018 0735 Last data filed at 10/08/2018 0424 Gross per 24 hour  Intake 170 ml  Output 350 ml  Net -180 ml      General:  AAOx3 NAD  HEENT: MMM [place] AT anicteric sclera CV:  Tachycardia  Lungs:  Clear and unlabored at rest Abd:  abd SNT/ND Extremities: no edema  GU no foley  Access: bandage over old RIJ nontunneled catheter site  MEDICATIONS:  . atorvastatin  40 mg Oral q1800  . chlorhexidine  15 mL Mouth Rinse BID  . Chlorhexidine Gluconate Cloth  6 each Topical Daily  . Chlorhexidine Gluconate Cloth  6 each Topical Q0600  . diltiazem  30 mg Per Tube Q6H  . enoxaparin (LOVENOX) injection  40 mg Subcutaneous Q24H  . gabapentin  300 mg Oral Daily  . Gerhardt's butt cream   Topical Daily  . insulin aspart  0-9 Units Subcutaneous TID WC  . multivitamin  1 tablet Oral QHS  . pantoprazole  40 mg Oral Daily  . polyethylene glycol  17 g Oral Daily  . sorbitol  20 mL Oral Daily       LABS:   CBC Latest Ref Rng & Units 10/08/2018 10/07/2018 10/06/2018  WBC 4.0 - 10.5 K/uL 11.0(H) 12.2(H) 8.1  Hemoglobin 13.0 - 17.0 g/dL 8.1(L) 9.1(L) 8.6(L)  Hematocrit 39.0 - 52.0 % 24.8(L) 27.8(L) 26.4(L)  Platelets 150 - 400 K/uL 103(L) 100(L) 90(L)    CMP Latest Ref Rng & Units 10/08/2018 10/07/2018 10/06/2018  Glucose 70 - 99 mg/dL 128(H) 153(H) 112(H)  BUN 8 - 23 mg/dL 47(H) 47(H) 51(H)  Creatinine 0.61 - 1.24 mg/dL 2.60(H) 2.49(H)  2.55(H)  Sodium 135 - 145 mmol/L 131(L) 135 136  Potassium 3.5 - 5.1 mmol/L 3.5 3.9 3.4(L)  Chloride 98 - 111 mmol/L 94(L) 99 99  CO2 22 - 32 mmol/L $RemoveB'25 26 25  '[id number]$ Calcium 8.9 - 10.3 mg/dL 8.1(L) 8.6(L) 8.5(L)  Total Protein 6.5 - 8.1 g/dL - - -  Total Bilirubin 0.3 - 1.2 mg/dL - - -  Alkaline Phos 38 - 126 U/L - - -  AST 15 - 41 U/L - - -  ALT 0 - 44 U/L - - -    Lab Results  Component Value Date   PTH 96 (H) 09/25/2018   CALCIUM 8.1 (L) 10/08/2018   CAION 1.19 09/20/2016   PHOS 3.5 10/08/2018       Component Value Date/Time   COLORURINE YELLOW 09/18/2018 1302   APPEARANCEUR CLOUDY (A) 09/18/2018 1302   LABSPEC 1.011 09/18/2018 1302   PHURINE 5.0 09/18/2018 1302   GLUCOSEU >=500 (A) 09/18/2018 1302   HGBUR MODERATE (A) 09/18/2018 1302   BILIRUBINUR NEGATIVE 09/18/2018 1302   KETONESUR NEGATIVE 09/18/2018 1302   PROTEINUR NEGATIVE 09/18/2018 1302   UROBILINOGEN 0.2 01/31/2010 0240  NITRITE NEGATIVE 09/18/2018 1302   LEUKOCYTESUR NEGATIVE 09/18/2018 1302      Component Value Date/Time   PHART 7.255 (L) 09/17/2018 0317   PCO2ART 39.0 09/17/2018 0317   PO2ART 66.3 (L) 09/17/2018 0317   HCO3 16.7 (L) 09/17/2018 0317   TCO2 29 09/20/2016 0657   ACIDBASEDEF 9.2 (H) 09/17/2018 0317   O2SAT 90.7 09/17/2018 0317       Component Value Date/Time   IRON 88 07/24/2018 1230   TIBC 240 07/24/2018 1230   FERRITIN 3,307 (H) 09/08/2018 1047   IRONPCTSAT 37 07/24/2018 1230       ASSESSMENT/PLAN:     68 year old male patient who presented with respiratory failure and fever secondary to presumed pneumonia.  He was started on empiric antibiotics.  COVID-19 was negative.  His respiratory status worsened requiring mechanical ventilation and CRRT for acute renal failure.  Then transitioned to IHD as needed.  Also note hx enlarged prostate s/p prostatectomy (2008 per chart review).  GU hx also with transurethral resection of bladder tumor 2018 per chart review    1.  Acute kidney injury.  Nonoliguric and resolving and may be 2/2 ATN.  Renal ultrasound from 09/18/2018 was negative for hydronephrosis. - Had HD on 6/23 for clearance after a long pause since 6/15.  non-tunneled catheter is now out  - Continue supportive care.  Gentle IV fluids today ~ 75/hr for about 1 liter - Check bladder scan and in/out cath if indicated   - Continue strict ins/outs  2.  Chronic kidney disease stage III.  His baseline serum creatinine runs around 1.4-1.5 per charting.  CKD felt 2/2 DM.  His urine is negative for protein.  PTH 96 from 09/25/2018.    3.  Hyperphosphatemia.  Resolved following HD.  I have discontinued renvela with overall improving renal function  4.  Anemia setting of acute illness and malignancy. no acute need for PRBC's.  Defer further ESA - metastatic bladder cancer.  Check iron stores.  5.  Pneumonia.  Respiratory status is improving.   6. Thrombocytopenia - improving.  Hem/onc following and setting of malignancy/chemo.    Claudia Desanctis 10/08/2018 7:35 AM

## 2018-10-08 NOTE — Progress Notes (Signed)
PROGRESS NOTE    [name] Rose  FYB:[id number] DOB: [birth year] DOA: 09/08/2018 PCP: Shirline Frees, MD   Brief Narrative:  68 y.o. 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    Patient presented secondary to significant weakness overnight.  He reports no associated symptoms.  Weakness occurred acutely as he felt normal the day prior.  He reports receiving chemotherapy on a 2-week on 1 week off regimen.  Last round of chemotherapy was just over 1-1/2 weeks ago.  Patient reports not generally feeling this week with chemotherapy.  Weakness has improved slightly since coming to the hospital. Admitted 09/08/2018 with probable viral pneumonia-coronavirus 192 negative in addition to hypotension and AKI creatinine up to 2.3 from 1.7 baseline     Subjective: 6/28 A/O x4, negative CP, negative S OB, negative abdominal pain laying in bed comfortably.   Assessment & Plan:   Principal Problem:   Pneumonia Active Problems:   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 (H[facility])   LFT elevation   AKI (acute kidney injury) ([place])   CKD (chronic kidney disease), stage III (H[facility])   Acute respiratory failure with hypoxia (H[facility])   Acute renal failure (H[facility])   Acute renal failure with acute tubular necrosis superimposed on stage 3 chronic kidney disease ([place])   Metastatic cancer ([place])   Palliative care encounter   Chronic atrial fibrillation   Severe muscle deconditioning   Acute renal failure with ATN superimposed on CKD stage III (baseline Cr 1.4-1.5) - 6/26 per Dr. Harrie Jeans nephrology does not anticipate patient requiring HD. -RIGHT IJ Vas-Cath removed 6/26   -Strict in and out -18.8 L -Daily weight Filed Weights   10/06/18 0324 10/07/18 0500 10/08/18 0426  Weight: 103.6 kg 101.6 kg 101.4 kg   Recent Labs  Lab 10/04/18 0500 10/05/18 0411 10/06/18 0315 10/07/18 0358 10/08/18 0438   CREATININE 2.21* 2.53* 2.55* 2.49* 2.60*  - 6/27 Vas-Cath removed area covered and clean negative sign of bleeding or infection.    Persistent atrial fibrillation -CHAD2VASC2 = 3 - Currently NSR - Cardizem 30 mg every 6 hours -Lovenox 40 mg daily -Labetalol PRN -Metoprolol PRN  Essential HTN -See A. Fib  Hypokalemia - Potassium goal> 4 -Potassium p.o. 50 mEq  Hypomagnesmia - Magnesium goal> 2 - Magnesium IV 2 g  Metastatic bladder and prostate cancer - Oncology [name] managing - 6/16 bone marrow biopsy left ilium negative cancer cells see results below - Per [name] note spoke with wife concerning future oncology issues.    Diabetes type 2 controlled with complication - 5/27 hemoglobin A1c= 4.9 - Sensitive SSI  HLD - LDL not within ADA/AHA guidelines - Lipitor 40 mg daily - LDL goal<70  Viral pneumonia? -Supportive care -Patient received small dose of antibiotics but discontinued. - BAL 6/2 showed Candida albicans, not treated  Dysphasia oral pharyngeal - Resolved - Renal/carb modified  Severe muscle deconditioning - Ambulate patient daily   Goals of care -6/27 consult to physical medicine for evaluation for CIR placement: Inpatient rehab feels patient not appropriate for CIR. - 6/28 consult social work for placement to SNF     DVT prophylaxis: Lovenox Code Status: Full Family Communication: None Disposition Plan: TBD   Consultants:  Nephrology Oncology    Procedures/Significant Events:  6/16:Bone Marrow, Aspirate,Biopsy, left ilium - MILDLY HYPERCELLULAR MARROW WITH MEGAKARYOCYTIC HYPOPLASIA AND DYSPLASIA. - he marrow is mildly hypercellular. Megakaryocytes are reduced and dysplastic. There is very mild  dysplasia in the erythroid cells. Overall, these changes are suggestive of a low-grade myelodysplastic syndrome, although non-neoplastic causes of dysplasia should be excluded. Correlation with FISH is recommended.   I have  personally reviewed and interpreted all radiology studies and my findings are as above.  VENTILATOR SETTINGS:    Cultures 5/29 blood right Port-A-Cath negative 5/29 blood left antecubital negative 5/29 urine insignificant growth 5/29 MRSA by PCR negative 6/2 BAL positive Candida albicans     Antimicrobials: Anti-infectives (From admission, onward)   Start     Stop   09/15/18 0600  ceFEPIme (MAXIPIME) 2 g in sodium chloride 0.9 % 100 mL IVPB  Status:  Discontinued     09/16/18 0957   09/11/18 1200  sulfamethoxazole-trimethoprim (BACTRIM) 560 mg in dextrose 5 % 500 mL IVPB  Status:  Discontinued     09/13/18 1309   09/10/18 1800  azithromycin (ZITHROMAX) 500 mg in sodium chloride 0.9 % 250 mL IVPB  Status:  Discontinued     09/16/18 0957   09/09/18 1800  vancomycin (VANCOCIN) 1,250 mg in sodium chloride 0.9 % 250 mL IVPB  Status:  Discontinued     09/08/18 1744   09/08/18 1800  vancomycin (VANCOCIN) 2,000 mg in sodium chloride 0.9 % 500 mL IVPB  Status:  Discontinued     09/08/18 1744   09/08/18 1800  ceFEPIme (MAXIPIME) 2 g in sodium chloride 0.9 % 100 mL IVPB  Status:  Discontinued     09/14/18 0816   09/08/18 0500  vancomycin (VANCOCIN) IVPB 1000 mg/200 mL premix     09/08/18 0612   09/08/18 0500  ceFEPIme (MAXIPIME) 2 g in sodium chloride 0.9 % 100 mL IVPB     09/08/18 0544       Devices    LINES / TUBES:  -RIGHT IJ Vas-Cath removed 6/26      Continuous Infusions:    Objective: Vitals:   10/07/18 2300 10/08/18 0342 10/08/18 0426 10/08/18 0434  BP: 130/75 117/65    Pulse: (!) 124 (!) 115  (!) 104  Resp: (!) 22 (!) 22  20  Temp: 98.9 F (37.2 C) 98.8 F (37.1 C)    TempSrc: Oral Oral    SpO2: 93% 92%  94%  Weight:   101.4 kg   Height:   _0  (1.88 m)     Intake/Output Summary (Last 24 hours) at 10/08/2018 0745 Last data filed at 10/08/2018 0424 Gross per 24 hour  Intake 170 ml  Output 450 ml  Net -280 ml   Filed Weights   10/06/18 0324 10/07/18  0500 10/08/18 0426  Weight: 103.6 kg 101.6 kg 101.4 kg   Physical Exam:  General: A/O x4, no acute respiratory distress Eyes: negative scleral hemorrhage, negative anisocoria, negative icterus ENT: Negative Runny nose, negative gingival bleeding, Neck:  Negative scars, masses, torticollis, lymphadenopathy, JVD Lungs: Clear to auscultation bilaterally without wheezes or crackles Cardiovascular: Regular rate and rhythm without murmur gallop or rub normal S1 and S2, port right chest wall covered and clean no sign of infection Abdomen: negative abdominal pain, nondistended, positive soft, bowel sounds, no rebound, no ascites, no appreciable mass Extremities: No significant cyanosis, clubbing, or edema bilateral lower extremities Skin: Negative rashes, lesions, ulcers Psychiatric:  Negative depression, negative anxiety, negative fatigue, negative mania  Central nervous system:  Cranial nerves II through XII intact, tongue/uvula midline, all extremities muscle strength 5/5, sensation intact throughout,  negative dysarthria, negative expressive aphasia, negative receptive aphasia.   Marland Kitchen  Data Reviewed: Care during the described time interval was provided by me .  I have reviewed this patient's available data, including medical history, events of note, physical examination, and all test results as part of my evaluation.   CBC: Recent Labs  Lab 10/04/18 0500 10/05/18 0411 10/06/18 0315 10/07/18 0358 10/08/18 0438  WBC 8.0 7.9 8.1 12.2* 11.0*  NEUTROABS 5.8 5.5 6.1 10.5* 8.6*  HGB 9.3* 8.5* 8.6* 9.1* 8.1*  HCT 29.3* 26.4* 26.4* 27.8* 24.8*  MCV 102.4* 101.5* 99.6 100.0 100.0  PLT 70* 76* 90* 100* 453*   Basic Metabolic Panel: Recent Labs  Lab 10/02/18 0500  10/04/18 0500 10/05/18 0411 10/06/18 0315 10/07/18 0358 10/08/18 0438  NA 135   < > 139 137 136 135 131*  K 3.8   < > 4.1 3.7 3.4* 3.9 3.5  CL 87*   < > 99 100 99 99 94*  CO2 33*   < > _0 GLUCOSE 131*   < >  109* 104* 112* 153* 128*  BUN 115*   < > 47* 49* 51* 47* 47*  CREATININE 4.29*   < > 2.21* 2.53* 2.55* 2.49* 2.60*  CALCIUM 8.6*   < > 8.9 8.4* 8.5* 8.6* 8.1*  MG 2.2  --   --  1.6* 1.6* 1.7 1.6*  PHOS 6.5*   < > 3.6 3.5 3.2 3.1 3.5   < > = values in this interval not displayed.   GFR: Estimated Creatinine Clearance: 35.1 mL/min (A) (by C-G formula based on SCr of 2.6 mg/dL (H)). Liver Function Tests: Recent Labs  Lab 10/04/18 0500 10/05/18 0411 10/06/18 0315 10/07/18 0358 10/08/18 0438  ALBUMIN 2.7* 2.5* 2.6* 2.5* 2.2*   No results for input(s): LIPASE, AMYLASE in the last 168 hours. No results for input(s): AMMONIA in the last 168 hours. Coagulation Profile: No results for input(s): INR, PROTIME in the last 168 hours. Cardiac Enzymes: No results for input(s): CKTOTAL, CKMB, CKMBINDEX, TROPONINI in the last 168 hours. BNP (last 3 results) No results for input(s): PROBNP in the last 8760 hours. HbA1C: No results for input(s): HGBA1C in the last 72 hours. CBG: Recent Labs  Lab 10/07/18 0604 10/07/18 1112 10/07/18 1623 10/07/18 2128 10/08/18 0610  GLUCAP 160* 131* 146* 187* 111*   Lipid Profile: No results for input(s): CHOL, HDL, LDLCALC, TRIG, CHOLHDL, LDLDIRECT in the last 72 hours. Thyroid Function Tests: No results for input(s): TSH, T4TOTAL, FREET4, T3FREE, THYROIDAB in the last 72 hours. Anemia Panel: No results for input(s): VITAMINB12, FOLATE, FERRITIN, TIBC, IRON, RETICCTPCT in the last 72 hours. Urine analysis:    Component Value Date/Time   COLORURINE YELLOW 09/18/2018 1302   APPEARANCEUR CLOUDY (A) 09/18/2018 1302   LABSPEC 1.011 09/18/2018 1302   PHURINE 5.0 09/18/2018 1302   GLUCOSEU >=500 (A) 09/18/2018 1302   HGBUR MODERATE (A) 09/18/2018 1302   BILIRUBINUR NEGATIVE 09/18/2018 1302   KETONESUR NEGATIVE 09/18/2018 1302   PROTEINUR NEGATIVE 09/18/2018 1302   UROBILINOGEN 0.2 01/31/2010 0624   NITRITE NEGATIVE 09/18/2018 1302   LEUKOCYTESUR  NEGATIVE 09/18/2018 1302   Sepsis Labs: _1 (procalcitonin:4,lacticidven:4)  )No results found for this or any previous visit (from the past 240 hour(s)).       Radiology Studies: No results found.      Scheduled Meds: . atorvastatin  40 mg Oral q1800  . chlorhexidine  15 mL Mouth Rinse BID  . Chlorhexidine Gluconate Cloth  6 each Topical Daily  . Chlorhexidine Gluconate Cloth  6 each Topical Q0600  . diltiazem  30 mg Per Tube Q6H  . enoxaparin (LOVENOX) injection  40 mg Subcutaneous Q24H  . gabapentin  300 mg Oral Daily  . Gerhardt's butt cream   Topical Daily  . insulin aspart  0-9 Units Subcutaneous TID WC  . multivitamin  1 tablet Oral QHS  . pantoprazole  40 mg Oral Daily  . polyethylene glycol  17 g Oral Daily  . sorbitol  20 mL Oral Daily   Continuous Infusions:    LOS: 30 days   The patient is critically ill with multiple organ systems failure and requires high complexity decision making for assessment and support, frequent evaluation and titration of therapies, application of advanced monitoring technologies and extensive interpretation of multiple databases. Critical Care Time devoted to patient care services described in this note  Time spent: 40 minutes     Terrionna Bridwell, Geraldo Docker, MD [facility] Pager [phone]  If 7PM-7AM, please contact night-coverage [email or web address] Password [facility] 10/08/2018, 7:45 AM

## 2018-10-09 ENCOUNTER — Inpatient Hospital Stay: Payer: PPO

## 2018-10-09 ENCOUNTER — Inpatient Hospital Stay: Payer: PPO | Admitting: Hematology & Oncology

## 2018-10-09 LAB — GLUCOSE, CAPILLARY
Glucose-Capillary: 142 mg/dL — ABNORMAL HIGH (ref 70–99)
Glucose-Capillary: 122 mg/dL — ABNORMAL HIGH (ref 70–99)
Glucose-Capillary: 141 mg/dL — ABNORMAL HIGH (ref 70–99)
Glucose-Capillary: 117 mg/dL — ABNORMAL HIGH (ref 70–99)
Glucose-Capillary: 130 mg/dL — ABNORMAL HIGH (ref 70–99)

## 2018-10-09 LAB — RENAL FUNCTION PANEL
Albumin: 2 g/dL — ABNORMAL LOW (ref 3.5–5.0)
Anion gap: 12 (ref 5–15)
BUN: 46 mg/dL — ABNORMAL HIGH (ref 8–23)
CO2: 24 mmol/L (ref 22–32)
Calcium: 8.1 mg/dL — ABNORMAL LOW (ref 8.9–10.3)
Chloride: 96 mmol/L — ABNORMAL LOW (ref 98–111)
Creatinine, Ser: 2.61 mg/dL — ABNORMAL HIGH (ref 0.61–1.24)
GFR calc Af Amer: 28 mL/min — ABNORMAL LOW (ref >60)
GFR calc non Af Amer: 24 mL/min — ABNORMAL LOW (ref >60)
Glucose, Bld: 123 mg/dL — ABNORMAL HIGH (ref 70–99)
Phosphorus: 3.2 mg/dL (ref 2.5–4.6)
Potassium: 3.9 mmol/L (ref 3.5–5.1)
Sodium: 132 mmol/L — ABNORMAL LOW (ref 135–145)

## 2018-10-09 LAB — CBC WITH DIFFERENTIAL/PLATELET
Abs Immature Granulocytes: 0.12 10*3/uL — ABNORMAL HIGH (ref 0.00–0.07)
Basophils Absolute: 0 10*3/uL (ref 0.0–0.1)
Basophils Relative: 0 %
Eosinophils Absolute: 0 10*3/uL (ref 0.0–0.5)
Eosinophils Relative: 0 %
HCT: 26.3 % — ABNORMAL LOW (ref 39.0–52.0)
Hemoglobin: 8.5 g/dL — ABNORMAL LOW (ref 13.0–17.0)
Immature Granulocytes: 1 %
Lymphocytes Relative: 3 %
Lymphs Abs: 0.4 10*3/uL — ABNORMAL LOW (ref 0.7–4.0)
MCH: 32.1 pg (ref 26.0–34.0)
MCHC: 32.3 g/dL (ref 30.0–36.0)
MCV: 99.2 fL (ref 80.0–100.0)
Monocytes Absolute: 1.6 10*3/uL — ABNORMAL HIGH (ref 0.1–1.0)
Monocytes Relative: 12 %
Neutro Abs: 11.1 10*3/uL — ABNORMAL HIGH (ref 1.7–7.7)
Neutrophils Relative %: 84 %
Platelets: 110 10*3/uL — ABNORMAL LOW (ref 150–400)
RBC: 2.65 MIL/uL — ABNORMAL LOW (ref 4.22–5.81)
RDW: 18.1 % — ABNORMAL HIGH (ref 11.5–15.5)
WBC: 13.3 10*3/uL — ABNORMAL HIGH (ref 4.0–10.5)
nRBC: 0 % (ref 0.0–0.2)

## 2018-10-09 LAB — MAGNESIUM: Magnesium: 1.9 mg/dL (ref 1.7–2.4)

## 2018-10-09 LAB — LACTATE DEHYDROGENASE: LDH: 299 U/L — ABNORMAL HIGH (ref 98–192)

## 2018-10-09 MED ORDER — SODIUM CHLORIDE 0.9 % IV SOLN
510.0000 mg | Freq: Once | INTRAVENOUS | Status: AC
  Administered 2018-10-09: 09:00:00 510 mg via INTRAVENOUS
  Filled 2018-10-09: qty 17

## 2018-10-09 MED ORDER — DILTIAZEM 12 MG/ML ORAL SUSPENSION
45.0000 mg | Freq: Four times a day (QID) | ORAL | Status: DC
  Administered 2018-10-09 – 2018-10-10 (×3): 45 mg
  Filled 2018-10-09 (×3): qty 6

## 2018-10-09 NOTE — Progress Notes (Signed)
Physical Therapy Treatment Patient Details Name: [name] Rose MRN: [id number] DOB: May 09, 1950 Today's Date: 10/09/2018    History of Present Illness Pt is a 68 y/o male presenting with respiratory failure and fever secondary to presumed pneumonia. SARS virus test negative x 2 Intubated 6/2 thru 6/10, CRRT while in ICU.  Course complicated by anemia and atrial fib with RVR. PMH: prostate cancer with bladder mets, CKD, HTN, hyperglycemia.     PT Comments    Patient is highly motivated and very weak. He has 2+/5 strength in bil quadriceps and ankle dorsiflexion. The day prior to presenting to the ED, he had a sudden change in his strength and could not stand from his recliner. He tried to crawl and could not get up from the floor (per medical records). Unclear etiology of his severe weakness, especially as it started PRIOR to his long hospitalization. (Appears on admission it was attributed to fatigue/weakness from chemotherapy). His respiratory decline was then driving his care. The weakness in his legs appears to be more neurologic weakness, especially as his strength has not improved since initiating PT. Will communicate concerns to MD. Patient encouraged to do any AROM he can do with his legs between therapy sessions.   Of note, his HR was 90s to 120s during session which included attempted standing x 2. RR max of 38    Follow Up Recommendations  CIR (noted they cannot currently take pt due to concerns re: elevated HR with therapies)     Equipment Recommendations  (TBA)    Recommendations for Other Services       Precautions / Restrictions Precautions Precautions: Fall Restrictions Weight Bearing Restrictions: No    Mobility  Bed Mobility Overal bed mobility: Needs Assistance Bed Mobility: Rolling;Sidelying to Sit;Sit to Sidelying Rolling: Max assist Sidelying to sit: +2 for physical assistance;HOB elevated;Mod assist     Sit to sidelying: Max assist;+2 for physical  assistance General bed mobility comments: as RLE lowered off EOB to sit, pt yelled out due to rt medial knee pain that subsided as soon as his leg was supported; no reports of pain in wt-bearing  Transfers Overall transfer level: Needs assistance   Transfers: Sit to/from Stand Sit to Stand: Max assist;+2 physical assistance;From elevated surface(able to clear hips 6-8 inches using pad under pelvis)         General transfer comment: 2 trials of standing in the STEDY. Pt couldnt get his hips high enough to clear the seat without difficulty  Ambulation/Gait             General Gait Details: unable   Stairs             Wheelchair Mobility    Modified Rankin (Stroke Patients Only)       Balance Overall balance assessment: Needs assistance Sitting-balance support: Feet supported;Single extremity supported Sitting balance-Leahy Scale: Fair Sitting balance - Comments: min guard sitting EOB, preference to B UE support; tends to lean backwards with seated exercises Postural control: Posterior lean                                  Cognition Arousal/Alertness: Awake/alert Behavior During Therapy: WFL for tasks assessed/performed Overall Cognitive Status: (NT formally)  Exercises General Exercises - Lower Extremity Ankle Circles/Pumps: Both;10 reps;Supine;AAROM;AROM Quad Sets: AROM;Both;Supine;5 reps Long Arc Quad: AROM;Both;Seated;AAROM;5 reps    General Comments        Pertinent Vitals/Pain Pain Assessment: Faces Faces Pain Scale: Hurts even more Pain Location: rt medial knee Pain Descriptors / Indicators: Grimacing;Guarding;Sharp(but brief) Pain Intervention(s): Limited activity within patient's tolerance;Monitored during session;Repositioned    Home Living Family/patient expects to be discharged to:: Private residence Living Arrangements: Spouse/significant other Available Help at  Discharge: Family;Available 24 hours/day Type of Home: House Home Access: Stairs to enter Entrance Stairs-Rails: None Home Layout: One level Home Equipment: Environmental consultant - 2 wheels;Cane - single point;Bedside commode;Adaptive equipment(per prior admission)      Prior Function Level of Independence: Independent          PT Goals (current goals can now be found in the care plan section) Acute Rehab PT Goals Patient Stated Goal: to get stronger Time For Goal Achievement: 10/10/18 Progress towards PT goals: Progressing toward goals    Frequency    Min 3X/week      PT Plan Current plan remains appropriate    Co-evaluation PT/OT/SLP Co-Evaluation/Treatment: Yes Reason for Co-Treatment: Complexity of the patient's impairments (multi-system involvement);For patient/therapist safety PT goals addressed during session: Mobility/safety with mobility;Strengthening/ROM        AM-PAC PT "6 Clicks" Mobility   Outcome Measure  Help needed turning from your back to your side while in a flat bed without using bedrails?: A Lot Help needed moving from lying on your back to sitting on the side of a flat bed without using bedrails?: A Lot Help needed moving to and from a bed to a chair (including a wheelchair)?: Total Help needed standing up from a chair using your arms (e.g., wheelchair or bedside chair)?: Total Help needed to walk in hospital room?: Total Help needed climbing 3-5 steps with a railing? : Total 6 Click Score: 8    End of Session Equipment Utilized During Treatment: Oxygen;Gait belt Activity Tolerance: Patient limited by fatigue Patient left: with call bell/phone within reach;in bed(bed alarm would not set with bed in partial chair position) Nurse Communication: Mobility status;Need for lift equipment PT Visit Diagnosis: Unsteadiness on feet (R26.81);Other abnormalities of gait and mobility (R26.89);Muscle weakness (generalized) (M62.81)     Time: 0981-1914 PT Time  Calculation (min) (ACUTE ONLY): 35 min  Charges:  $Therapeutic Activity: 8-22 mins                        [employer], PT 10/09/2018, 3:14 PM

## 2018-10-09 NOTE — Progress Notes (Signed)
Occupational Therapy Treatment Patient Details Name: [name] Rose MRN: [id number] DOB: Jan 24, 1951 Today's Date: 10/09/2018    History of present illness Pt is a 68 y/o male presenting with resiratory failure and fever secondary to presumed pneumonia.  Intubated 6/05/18/08, CRRT while in ICU.  Course complicated by anemia and atrial fib with RVR. PMH: prostate cancer with bladder mets, chemo induced mucositits, CKD, HTN, hyperglycemia.    OT comments  Pt continues to demonstrate motivation to work with therapies, however remains quite weak and deconditioned. Pt requiring two person assist for completion of bed mobility and for attempts to stand at [facility] today (x2 sit<>stands). Pt able to clear hips from EOB and hold briefly, however unable to achieve full upright standing position. Pt completed simple grooming ADL seated EOB with setup/minguard assist for static sitting balance. Pt on 2L this session with O2 sats maintaining >90%, max HR noted 122 with seated/standing activity, RR up to low 40s with activity however pt demonstrates good carryover using deep breathing techniques to lower RR into 20s. Bed left in chair position end of session. Continue to recommend post acute rehab at time of discharge to maximize his safety and independence with ADL and mobility. Will follow.    Follow Up Recommendations  CIR;Supervision/Assistance - 24 hour    Equipment Recommendations  3 in 1 bedside commode    Recommendations for Other Services      Precautions / Restrictions Precautions Precautions: Fall Restrictions Weight Bearing Restrictions: No       Mobility Bed Mobility Overal bed mobility: Needs Assistance Bed Mobility: Rolling;Sidelying to Sit;Sit to Sidelying Rolling: Max assist Sidelying to sit: +2 for physical assistance;HOB elevated;Mod assist     Sit to sidelying: Max assist;+2 for physical assistance General bed mobility comments: as RLE lowered off EOB to sit, pt yelled out due  to rt medial knee pain that subsided as soon as his leg was supported; no reports of pain in wt-bearing  Transfers Overall transfer level: Needs assistance   Transfers: Sit to/from Stand Sit to Stand: Max assist;+2 physical assistance;From elevated surface(able to clear hips 6-8 inches using pad under pelvis)         General transfer comment: 2 trials of standing in the STEDY. Pt couldnt get his hips high enough to clear the seat without difficulty    Balance Overall balance assessment: Needs assistance Sitting-balance support: Feet supported;Single extremity supported Sitting balance-Leahy Scale: Fair Sitting balance - Comments: min guard sitting EOB, preference to B UE support; tends to lean backwards with seated exercises Postural control: Posterior lean                                 ADL either performed or assessed with clinical judgement   ADL Overall ADL's : Needs assistance/impaired Eating/Feeding: Set up;Sitting   Grooming: Wash/dry face;Set up;Min guard;Sitting Grooming Details (indicate cue type and reason): minguard assist for balance sitting EOB                              Functional mobility during ADLs: Maximal assistance;Total assistance;+2 for physical assistance;+2 for safety/equipment General ADL Comments: pt remains very weak and deconditioned     Cognition Arousal/Alertness: Awake/alert Behavior During Therapy: WFL for tasks assessed/performed Overall Cognitive Status: Within Functional Limits for tasks assessed(NT formally)  Exercises Exercises: General Lower Extremity General Exercises - Lower Extremity Ankle Circles/Pumps: Both;10 reps;Supine;AAROM;AROM Quad Sets: AROM;Both;Supine;5 reps Long Arc Quad: AROM;Both;Seated;AAROM;5 reps   Shoulder Instructions       General Comments      Pertinent Vitals/ Pain       Pain Assessment: Faces Faces Pain Scale:  Hurts even more Pain Location: rt medial knee Pain Descriptors / Indicators: Grimacing;Guarding;Sharp(but brief) Pain Intervention(s): Limited activity within patient's tolerance;Repositioned;Monitored during session  Todd expects to be discharged to:: Private residence Living Arrangements: Spouse/significant other Available Help at Discharge: Family;Available 24 hours/day Type of Home: House Home Access: Stairs to enter [employer] of Steps: 1 Entrance Stairs-Rails: None Home Layout: One level     Bathroom Shower/Tub: Occupational psychologist: Handicapped height     Home Equipment: Environmental consultant - 2 wheels;Cane - single point;Bedside commode;Adaptive equipment(per prior admission)          Prior Functioning/Environment Level of Independence: Independent            Frequency  Min 2X/week        Progress Toward Goals  OT Goals(current goals can now be found in the care plan section)  Progress towards OT goals: Progressing toward goals  Acute Rehab OT Goals Patient Stated Goal: to get stronger OT Goal Formulation: With patient Time For Goal Achievement: 10/10/18 Potential to Achieve Goals: Good  Plan Discharge plan remains appropriate    Co-evaluation    PT/OT/SLP Co-Evaluation/Treatment: Yes Reason for Co-Treatment: Complexity of the patient's impairments (multi-system involvement);For patient/therapist safety;To address functional/ADL transfers PT goals addressed during session: Mobility/safety with mobility;Strengthening/ROM OT goals addressed during session: ADL's and Rose-care      AM-PAC OT "6 Clicks" Daily Activity     Outcome Measure   Help from another person eating meals?: A Little Help from another person taking care of personal grooming?: A Little Help from another person toileting, which includes using toliet, bedpan, or urinal?: Total Help from another person bathing (including washing, rinsing, drying)?:  A Lot Help from another person to put on and taking off regular upper body clothing?: A Lot Help from another person to put on and taking off regular lower body clothing?: Total 6 Click Score: 12    End of Session Equipment Utilized During Treatment: Oxygen;Gait belt(2L)  OT Visit Diagnosis: Other abnormalities of gait and mobility (R26.89);Muscle weakness (generalized) (M62.81)   Activity Tolerance Patient tolerated treatment well;Patient limited by fatigue   Patient Left in bed;with call bell/phone within reach   Nurse Communication Mobility status        Time: 4496-7591 OT Time Calculation (min): 38 min  Charges: OT General Charges $OT Visit: 1 Visit OT Treatments $Rose Care/Home Management : 8-22 mins  Lou Cal, [place] Pager [phone] Office [phone]    Raymondo Band 10/09/2018, 3:33 PM

## 2018-10-09 NOTE — TOC Progression Note (Addendum)
Transition of Care [facility]) - Progression Note    Patient Details  Name: [name] Rose MRN: [id number] Date of Birth: 1950/10/02  Transition of Care [facility]) CM/SW [place], LCSW Phone Number: 10/09/2018, 9:54 AM  Clinical Narrative: Received call from patient's wife. SNF preferences are 1. [facility] (haven't been taking new patients due to COVID restrictions but left message for admissions coordinator to see if there are any updates on that), 2. [facility] (declined because no bed availability but will see if they could manage his needs once a bed is available), 3. [facility] (declined because no bed availability but will see if they could manage his needs once a bed is available), and 4. [facility] (referral still pending. Will ask admissions coordinator to review). Made wife aware that [facility] and [facility] have made bed offers so far. Told her how his insurance covers SNF stay so that she is aware of $10-$20 copay per day for days 1-20 and then 20% copay (~$160 per day) for days 21-100.  3:20 pm: [facility] is still not taking outside referrals. [facility] unable to manage his needs. [facility] is not taking patients that have had any respiratory issues. [facility] is not taking any new admissions due to someone testing positive for COVID at the facility. Wife and daughter aware. Updated them on bed offers as well as those that are still pending. Told them how to access CMS Medicare scores online.  Expected Discharge Plan: Skilled Nursing Facility Barriers to Discharge: Continued Medical Work up  Expected Discharge Plan and Services Expected Discharge Plan: [place] In-house Referral: Clinical Social Work Discharge Planning Services: NA Post Acute Care Choice: [place] Living arrangements for the past 2 months: Single Family Home Expected Discharge Date: (unknown)               DME Arranged: N/A DME Agency: NA        HH Arranged: NA HH Agency: NA         Social Determinants of Health (SDOH) Interventions    Readmission Risk Interventions Readmission Risk Prevention Plan 09/08/2018 06/28/2018  Transportation Screening Complete Complete  PCP or Specialist Appt within 3-5 Days - Complete  HRI or [place] - Complete  Social Work Consult for [place] Planning/Counseling - Complete  Palliative Care Screening - Not Applicable  Medication Review Press photographer) Complete Complete  PCP or Specialist appointment within 3-5 days of discharge Not Complete -  PCP/Specialist Appt Not Complete comments not yet ready for d/c -  HRI or [place] Not Complete -  HRI or Home Care Consult Pt Refusal Comments no needs identified at this time -  SW Recovery Care/Counseling Consult Not Complete -  SW Consult Not Complete Comments no needs identified at this time -  Palliative Care Screening Not Applicable -  [place] Not Applicable -  Some recent data might be hidden

## 2018-10-09 NOTE — Progress Notes (Addendum)
[facility] NEPHROLOGY PROGRESS NOTE  Assessment/ Plan: Pt is a 68 y.o. yo male presented with respiratory failure and fever due to pneumonia, treated with antibiotics, required mechanical ventilation and CRRT then iHD for AKI.  History of prostate cancer status post resection.  #Acute kidney injury, nonoliguric, due to ATN.  Renal ultrasound negative for obstruction.  The last iHD was on 6/23.  The dialysis catheter is out now.  Serum creatinine level remains a stable at 2.6 today with urine output of 975 cc.  He has resolving AKI.  #CKD stage III: Baseline creatinine around 1.4-1.5, due to diabetes.  Patient can follow-up with [facility].  #Anemia in the setting of acute illness: Deferring ESA because of metastatic bladder CA.  Iron saturation very low with elevated ferritin likely inflammatory marker.  I will order a dose of Feraheme.   #Pneumonia: Respiratory status improving.  #Leukocytosis: Per primary team.  I will sign off, recommended to monitor BMP, urine output.  Avoid nephrotoxins.  Call us with question. Outpatient follow up arranged with Dr Royce Macadamia at [facility] on 10/30/2018, check in at 3 PM.   Subjective: Seen and examined at bedside.  Patient reported feeling good.  He has good urine output.  Denied headache, dizziness, nausea, vomiting, chest pain, shortness of breath.  He reported that he might go to inpatient rehab. Objective Vital signs in last 24 hours: Vitals:   10/09/18 0300 10/09/18 0421 10/09/18 0500 10/09/18 0720  BP: 115/72   112/67  Pulse: (!) 122   (!) 115  Resp: (!) 21 (!) 26  19  Temp: 98.4 F (36.9 C)   98.1 F (36.7 C)  TempSrc: Oral   Oral  SpO2: 91% 90%  96%  Weight:   109.1 kg   Height:       Weight change: 7.7 kg  Intake/Output Summary (Last 24 hours) at 10/09/2018 0753 Last data filed at 10/09/2018 8786 Gross per 24 hour  Intake 1130.04 ml  Output 1225 ml  Net -94.96 ml       Labs: Basic Metabolic  Panel: Recent Labs  Lab 10/07/18 0358 10/08/18 0438 10/09/18 0348  NA 135 131* 132*  K 3.9 3.5 3.9  CL 99 94* 96*  CO2 $Re'26 25 24  'YYP$ GLUCOSE 153* 128* 123*  BUN 47* 47* 46*  CREATININE 2.49* 2.60* 2.61*  CALCIUM 8.6* 8.1* 8.1*  PHOS 3.1 3.5 3.2   Liver Function Tests: Recent Labs  Lab 10/07/18 0358 10/08/18 0438 10/09/18 0348  ALBUMIN 2.5* 2.2* 2.0*   No results for input(s): LIPASE, AMYLASE in the last 168 hours. No results for input(s): AMMONIA in the last 168 hours. CBC: Recent Labs  Lab 10/05/18 0411 10/06/18 0315 10/07/18 0358 10/08/18 0438 10/09/18 0348  WBC 7.9 8.1 12.2* 11.0* 13.3*  NEUTROABS 5.5 6.1 10.5* 8.6* 11.1*  HGB 8.5* 8.6* 9.1* 8.1* 8.5*  HCT 26.4* 26.4* 27.8* 24.8* 26.3*  MCV 101.5* 99.6 100.0 100.0 99.2  PLT 76* 90* 100* 103* 110*   Cardiac Enzymes: No results for input(s): CKTOTAL, CKMB, CKMBINDEX, TROPONINI in the last 168 hours. CBG: Recent Labs  Lab 10/08/18 0610 10/08/18 1117 10/08/18 1613 10/08/18 2301 10/09/18 0601  GLUCAP 111* 136* 140* 142* 122*    Iron Studies:  Recent Labs    10/08/18 1233  IRON 9*  TIBC 176*  FERRITIN 1,669*   Studies/Results: No results found.  Medications: Infusions:   Scheduled Medications: . atorvastatin  40 mg Oral q1800  . chlorhexidine  15 mL  Mouth Rinse BID  . Chlorhexidine Gluconate Cloth  6 each Topical Daily  . Chlorhexidine Gluconate Cloth  6 each Topical Q0600  . diltiazem  30 mg Per Tube Q6H  . enoxaparin (LOVENOX) injection  40 mg Subcutaneous Q24H  . gabapentin  300 mg Oral Daily  . Gerhardt's butt cream   Topical Daily  . insulin aspart  0-9 Units Subcutaneous TID WC  . multivitamin  1 tablet Oral QHS  . pantoprazole  40 mg Oral Daily  . polyethylene glycol  17 g Oral Daily  . sorbitol  20 mL Oral Daily    have reviewed scheduled and prn medications.  Physical Exam: General:NAD, comfortable Heart:RRR, s1s2 nl Lungs:clear b/l, no crackle Abdomen:soft, Non-tender,  non-distended Extremities:No edema Dialysis Access: None  Aniruddh Ciavarella Prasad Vear [place] 10/09/2018,7:53 AM  LOS: 31 days  Pager: [phone]

## 2018-10-09 NOTE — Telephone Encounter (Signed)
Opened in error

## 2018-10-09 NOTE — Progress Notes (Addendum)
PROGRESS NOTE    [name] Rose  GOT:[id number] DOB: September 23, 1950 DOA: 09/08/2018 PCP: Shirline Frees, MD   Brief Narrative:  68 y.o. 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    Patient presented secondary to significant weakness overnight.  He reports no associated symptoms.  Weakness occurred acutely as he felt normal the day prior.  He reports receiving chemotherapy on a 2-week on 1 week off regimen.  Last round of chemotherapy was just over 1-1/2 weeks ago.  Patient reports not generally feeling this week with chemotherapy.  Weakness has improved slightly since coming to the hospital. Admitted 09/08/2018 with probable viral pneumonia-coronavirus 192 negative in addition to hypotension and AKI creatinine up to 2.3 from 1.7 baseline     Subjective: 6/29 A/O x4, negative CP, negative S OB negative abdominal pain lying in bed comfortably.  Has not been out of bed today.   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 Harrie Jeans nephrology does not anticipate patient requiring HD. -RIGHT IJ Vas-Cath removed 6/26   -Strict in and out -18.8 L -Daily weight Filed Weights   10/07/18 0500 10/08/18 0426 10/09/18 0500  Weight: 101.6 kg 101.4 kg 109.1 kg   Recent Labs  Lab 10/05/18 0411 10/06/18 0315 10/07/18 0358 10/08/18  0438 10/09/18 0348  CREATININE 2.53* 2.55* 2.49* 2.60* 2.61*  - 6/27 Vas-Cath removed area covered and clean negative sign of bleeding or infection.    Persistent atrial fibrillation -CHAD2VASC2 = 3 - Currently NSR - 6/29 increase Cardizem 45 mg QID -Lovenox 40 mg daily -Labetalol PRN -Metoprolol PRN  Essential HTN -See A. Fib  Hypokalemia - Potassium goal> 4 -Potassium p.o. 50 mEq  Hypomagnesmia - Magnesium goal> 2 - Magnesium IV 2 g  Metastatic bladder and prostate cancer - Oncology [name] managing - 6/16 bone marrow biopsy left ilium negative cancer cells see results below - Per [name] note spoke with wife concerning future oncology issues.    Diabetes type 2 controlled with complication - 5/97 hemoglobin A1c= 4.9 - Sensitive SSI  HLD - LDL not within ADA/AHA guidelines - Lipitor 40 mg daily - LDL goal<70  Viral pneumonia? -Supportive care -Patient received small dose of antibiotics but discontinued. - BAL 6/2 showed Candida albicans, not treated  Dysphasia oral pharyngeal - Resolved - Renal/carb modified  Severe muscle deconditioning - Ambulate patient in hallway daily   Goals of care -6/27 consult to physical medicine for evaluation for CIR placement: Inpatient rehab feels patient not appropriate for CIR. - 6/29 consult social work: Awaiting SNF placement -6/29 SNF coronavirus pending    DVT prophylaxis: Lovenox Code Status: Full Family Communication: 6/29 spoke with Ms. Hassell answered all questions concerning plan of care Disposition Plan: TBD   Consultants:  Nephrology Oncology    Procedures/Significant Events:  6/16:Bone Marrow, Aspirate,Biopsy, left ilium -  MILDLY HYPERCELLULAR MARROW WITH MEGAKARYOCYTIC HYPOPLASIA AND DYSPLASIA. - he marrow is mildly hypercellular. Megakaryocytes are reduced and dysplastic. There is very mild dysplasia in the erythroid cells. Overall, these changes are suggestive of a low-grade  myelodysplastic syndrome, although non-neoplastic causes of dysplasia should be excluded. Correlation with FISH is recommended.   I have personally reviewed and interpreted all radiology studies and my findings are as above.  VENTILATOR SETTINGS:    Cultures 5/29 blood right Port-A-Cath negative 5/29 blood left antecubital negative 5/29 urine insignificant growth 5/29 MRSA by PCR negative 6/2 BAL positive Candida albicans 6/29 coronavirus pending     Antimicrobials: Anti-infectives (From admission, onward)   Start     Stop   09/15/18 0600  ceFEPIme (MAXIPIME) 2 g in sodium chloride 0.9 % 100 mL IVPB  Status:  Discontinued     09/16/18 0957   09/11/18 1200  sulfamethoxazole-trimethoprim (BACTRIM) 560 mg in dextrose 5 % 500 mL IVPB  Status:  Discontinued     09/13/18 1309   09/10/18 1800  azithromycin (ZITHROMAX) 500 mg in sodium chloride 0.9 % 250 mL IVPB  Status:  Discontinued     09/16/18 0957   09/09/18 1800  vancomycin (VANCOCIN) 1,250 mg in sodium chloride 0.9 % 250 mL IVPB  Status:  Discontinued     09/08/18 1744   09/08/18 1800  vancomycin (VANCOCIN) 2,000 mg in sodium chloride 0.9 % 500 mL IVPB  Status:  Discontinued     09/08/18 1744   09/08/18 1800  ceFEPIme (MAXIPIME) 2 g in sodium chloride 0.9 % 100 mL IVPB  Status:  Discontinued     09/14/18 0816   09/08/18 0500  vancomycin (VANCOCIN) IVPB 1000 mg/200 mL premix     09/08/18 0612   09/08/18 0500  ceFEPIme (MAXIPIME) 2 g in sodium chloride 0.9 % 100 mL IVPB     09/08/18 0544       Devices    LINES / TUBES:  -RIGHT IJ Vas-Cath removed 6/26      Continuous Infusions: . ferumoxytol       Objective: Vitals:   10/09/18 0300 10/09/18 0421 10/09/18 0500 10/09/18 0720  BP: 115/72   112/67  Pulse: (!) 122   (!) 115  Resp: (!) 21 (!) 26  19  Temp: 98.4 F (36.9 C)   98.1 F (36.7 C)  TempSrc: Oral   Oral  SpO2: 91% 90%  96%  Weight:   109.1 kg   Height:        Intake/Output Summary (Last 24  hours) at 10/09/2018 0841 Last data filed at 10/09/2018 7829 Gross per 24 hour  Intake 1130.04 ml  Output 1025 ml  Net 105.04 ml   Filed Weights   10/07/18 0500 10/08/18 0426 10/09/18 0500  Weight: 101.6 kg 101.4 kg 109.1 kg   Physical Exam:  General: A/O x4 no acute respiratory distress Eyes: negative scleral hemorrhage, negative anisocoria, negative icterus ENT: Negative Runny nose, negative gingival bleeding, Neck:  Negative scars, masses, torticollis, lymphadenopathy, JVD Lungs: Clear to auscultation bilaterally without wheezes or crackles Cardiovascular: Regular rate and rhythm without murmur gallop or rub normal S1 and S2 Abdomen: negative abdominal pain, nondistended, positive soft, bowel sounds, no rebound, no ascites, no appreciable mass Extremities: No significant cyanosis, clubbing, or edema bilateral lower extremities Skin: Negative rashes, lesions, ulcers Psychiatric:  Negative depression, negative anxiety, negative fatigue, negative mania  Central nervous system:  Cranial nerves II through XII intact, tongue/uvula midline, all extremities muscle strength 5/5, sensation intact  throughout,  negative dysarthria, negative expressive aphasia, negative receptive aphasia.   .     Data Reviewed: Care during the described time interval was provided by me .  I have reviewed this patient's available data, including medical history, events of note, physical examination, and all test results as part of my evaluation.   CBC: Recent Labs  Lab 10/05/18 0411 10/06/18 0315 10/07/18 0358 10/08/18 0438 10/09/18 0348  WBC 7.9 8.1 12.2* 11.0* 13.3*  NEUTROABS 5.5 6.1 10.5* 8.6* 11.1*  HGB 8.5* 8.6* 9.1* 8.1* 8.5*  HCT 26.4* 26.4* 27.8* 24.8* 26.3*  MCV 101.5* 99.6 100.0 100.0 99.2  PLT 76* 90* 100* 103* 532*   Basic Metabolic Panel: Recent Labs  Lab 10/05/18 0411 10/06/18 0315 10/07/18 0358 10/08/18 0438 10/09/18 0348  NA 137 136 135 131* 132*  K 3.7 3.4* 3.9 3.5 3.9   CL 100 99 99 94* 96*  CO2 _0 GLUCOSE 104* 112* 153* 128* 123*  BUN 49* 51* 47* 47* 46*  CREATININE 2.53* 2.55* 2.49* 2.60* 2.61*  CALCIUM 8.4* 8.5* 8.6* 8.1* 8.1*  MG 1.6* 1.6* 1.7 1.6* 1.9  PHOS 3.5 3.2 3.1 3.5 3.2   GFR: Estimated Creatinine Clearance: 36.1 mL/min (A) (by C-G formula based on SCr of 2.61 mg/dL (H)). Liver Function Tests: Recent Labs  Lab 10/05/18 0411 10/06/18 0315 10/07/18 0358 10/08/18 0438 10/09/18 0348  ALBUMIN 2.5* 2.6* 2.5* 2.2* 2.0*   No results for input(s): LIPASE, AMYLASE in the last 168 hours. No results for input(s): AMMONIA in the last 168 hours. Coagulation Profile: No results for input(s): INR, PROTIME in the last 168 hours. Cardiac Enzymes: No results for input(s): CKTOTAL, CKMB, CKMBINDEX, TROPONINI in the last 168 hours. BNP (last 3 results) No results for input(s): PROBNP in the last 8760 hours. HbA1C: No results for input(s): HGBA1C in the last 72 hours. CBG: Recent Labs  Lab 10/08/18 0610 10/08/18 1117 10/08/18 1613 10/08/18 2301 10/09/18 0601  GLUCAP 111* 136* 140* 142* 122*   Lipid Profile: No results for input(s): CHOL, HDL, LDLCALC, TRIG, CHOLHDL, LDLDIRECT in the last 72 hours. Thyroid Function Tests: No results for input(s): TSH, T4TOTAL, FREET4, T3FREE, THYROIDAB in the last 72 hours. Anemia Panel: Recent Labs    10/08/18 1233  FERRITIN 1,669*  TIBC 176*  IRON 9*   Urine analysis:    Component Value Date/Time   COLORURINE YELLOW 09/18/2018 1302   APPEARANCEUR CLOUDY (A) 09/18/2018 1302   LABSPEC 1.011 09/18/2018 1302   PHURINE 5.0 09/18/2018 1302   GLUCOSEU >=500 (A) 09/18/2018 1302   HGBUR MODERATE (A) 09/18/2018 1302   BILIRUBINUR NEGATIVE 09/18/2018 1302   KETONESUR NEGATIVE 09/18/2018 1302   PROTEINUR NEGATIVE 09/18/2018 1302   UROBILINOGEN 0.2 01/31/2010 0624   NITRITE NEGATIVE 09/18/2018 1302   LEUKOCYTESUR NEGATIVE 09/18/2018 1302   Sepsis Labs:  _1 (procalcitonin:4,lacticidven:4)  )No results found for this or any previous visit (from the past 240 hour(s)).       Radiology Studies: No results found.      Scheduled Meds: . atorvastatin  40 mg Oral q1800  . chlorhexidine  15 mL Mouth Rinse BID  . Chlorhexidine Gluconate Cloth  6 each Topical Daily  . Chlorhexidine Gluconate Cloth  6 each Topical Q0600  . diltiazem  30 mg Per Tube Q6H  . enoxaparin (LOVENOX) injection  40 mg Subcutaneous Q24H  . gabapentin  300 mg Oral Daily  . Gerhardt's butt cream   Topical Daily  . insulin aspart  0-9 Units Subcutaneous TID WC  . multivitamin  1 tablet Oral QHS  . pantoprazole  40 mg Oral Daily  . polyethylene glycol  17 g Oral Daily  . sorbitol  20 mL Oral Daily   Continuous Infusions: . ferumoxytol       LOS: 31 days   The patient is critically ill with multiple organ systems failure and requires high complexity decision making for assessment and support, frequent evaluation and titration of therapies, application of advanced monitoring technologies and extensive interpretation of multiple databases. Critical Care Time devoted to patient care services described in this note  Time spent: 40 minutes     Cerissa Zeiger, Geraldo Docker, MD [facility] Pager [phone]  If 7PM-7AM, please contact night-coverage [email or web address] Password [facility] 10/09/2018, 8:41 AM

## 2018-10-10 LAB — GLUCOSE, CAPILLARY
Glucose-Capillary: 97 mg/dL (ref 70–99)
Glucose-Capillary: 113 mg/dL — ABNORMAL HIGH (ref 70–99)
Glucose-Capillary: 127 mg/dL — ABNORMAL HIGH (ref 70–99)
Glucose-Capillary: 132 mg/dL — ABNORMAL HIGH (ref 70–99)

## 2018-10-10 LAB — RENAL FUNCTION PANEL
Albumin: 1.9 g/dL — ABNORMAL LOW (ref 3.5–5.0)
Anion gap: 11 (ref 5–15)
BUN: 44 mg/dL — ABNORMAL HIGH (ref 8–23)
CO2: 22 mmol/L (ref 22–32)
Calcium: 7.7 mg/dL — ABNORMAL LOW (ref 8.9–10.3)
Chloride: 96 mmol/L — ABNORMAL LOW (ref 98–111)
Creatinine, Ser: 2.73 mg/dL — ABNORMAL HIGH (ref 0.61–1.24)
GFR calc Af Amer: 27 mL/min — ABNORMAL LOW (ref >60)
GFR calc non Af Amer: 23 mL/min — ABNORMAL LOW (ref >60)
Glucose, Bld: 105 mg/dL — ABNORMAL HIGH (ref 70–99)
Phosphorus: 3.2 mg/dL (ref 2.5–4.6)
Potassium: 3.6 mmol/L (ref 3.5–5.1)
Sodium: 129 mmol/L — ABNORMAL LOW (ref 135–145)

## 2018-10-10 LAB — LACTATE DEHYDROGENASE: LDH: 254 U/L — ABNORMAL HIGH (ref 98–192)

## 2018-10-10 LAB — NOVEL CORONAVIRUS, NAA (HOSP ORDER, SEND-OUT TO REF LAB; TAT 18-24 HRS): SARS-CoV-2, NAA: NOT DETECTED

## 2018-10-10 MED ORDER — ENSURE ENLIVE PO LIQD
237.0000 mL | Freq: Two times a day (BID) | ORAL | Status: DC
  Administered 2018-10-11 – 2018-10-14 (×5): 237 mL via ORAL

## 2018-10-10 MED ORDER — DILTIAZEM 12 MG/ML ORAL SUSPENSION
60.0000 mg | Freq: Four times a day (QID) | ORAL | Status: DC
  Administered 2018-10-10 – 2018-10-14 (×17): 60 mg via ORAL
  Filled 2018-10-10 (×17): qty 6

## 2018-10-10 MED ORDER — GABAPENTIN 400 MG PO CAPS
400.0000 mg | ORAL_CAPSULE | Freq: Three times a day (TID) | ORAL | Status: DC
  Administered 2018-10-10 – 2018-10-14 (×13): 400 mg via ORAL
  Filled 2018-10-10 (×13): qty 1

## 2018-10-10 MED ORDER — DIGOXIN 0.25 MG/ML IJ SOLN
0.1250 mg | Freq: Once | INTRAMUSCULAR | Status: AC
  Administered 2018-10-10: 11:00:00 0.125 mg via INTRAVENOUS
  Filled 2018-10-10: qty 2

## 2018-10-10 MED ORDER — VITAMIN B-6 100 MG PO TABS
100.0000 mg | ORAL_TABLET | Freq: Every day | ORAL | Status: DC
  Administered 2018-10-10 – 2018-10-14 (×5): 100 mg via ORAL
  Filled 2018-10-10 (×5): qty 1

## 2018-10-10 NOTE — Progress Notes (Signed)
[name] Rose is now off dialysis.  His BUN and creatinine are holding steady.  He is urinating.  Now, the goal is to see about some kind of rehabilitation.  He has been worked on by occupational therapy and physical therapy.  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  He was on Neurontin.  We have to increase his Neurontin.  I will put him up to 400 mg p.o. 3 times daily.  I will also add vitamin B6.  I am sure that a lot of the weakness is from him just being in the hospital.  He has been hospital now for about a month.  He is really not been out of bed.  He has muscle atrophy in his legs.  Again, he is very motivated to try to do physical therapy.  He has always been motivated throughout his entire treatment of his bladder cancer.  He is eating.  He is having no problems with nausea or vomiting.  His last CBC was done on Monday.  His white cell count is 13,300.  Hemoglobin 8.5.  Platelet count 110,000.  Hopefully, the CBC will be done tomorrow.  His last BUN and creatinine from today was 44 and 2.73.  This is gradually creeping up but not too bad.  He clearly had iron deficiency.  His iron saturation is only 5% on the 28th.  He did get a dose of IV iron yesterday.  On his physical exam, his vital signs are temperature 98.4.  Pulse 100.  Blood pressure 110/64.  His head neck exam shows no ocular or oral lesions.  There are no palpable cervical or supraclavicular lymph nodes.  His lungs sound clear.  Cardiac exam regular rate and rhythm.  It might be slightly tachycardic.  Abdomen is soft.  Bowel sounds are present.  Extremities shows muscle atrophy in his lower extremities.  He has good sensation in his legs.  He has a neuropathy in his feet.  He has decent range of motion of his joints.  Again, the goal now for [name] Rose is for him to have some counter rehab program.  I still do not see that there is any way that we can treat his bladder  cancer given what is happened to him.  Thankfully, his last scans did not show any obvious disease so he is in a remission right now.  Again I know the staff in the stepdown unit are doing a great job with him.  I was surprised he is still in the unit.  Maybe, he can be moved to a regular floor.  [name] Haw, MD  [place] 12:12

## 2018-10-10 NOTE — TOC Progression Note (Addendum)
Transition of Care [facility]) - Progression Note    Patient Details  Name: CIRILO CANNER MRN: [id number] Date of Birth: 1950/07/01  Transition of Care [facility]) CM/SW [place], LCSW Phone Number: 10/10/2018, 8:49 AM  Clinical Narrative: [facility] is reviewing referral. If they are unable to offer, wife will accept [place] bed offer. COVID test done yesterday is is negative.  10:11 am: [facility] is unable to offer a bed. Wife has accepted [facility] bed offer. CSW called [employer] and started insurance authorization. Wife is aware we may get it either today or tomorrow.  Expected Discharge Plan: Skilled Nursing Facility Barriers to Discharge: Continued Medical Work up  Expected Discharge Plan and Services Expected Discharge Plan: [place] In-house Referral: Clinical Social Work Discharge Planning Services: NA Post Acute Care Choice: [place] Living arrangements for the past 2 months: Single Family Home Expected Discharge Date: (unknown)               DME Arranged: N/A DME Agency: NA       HH Arranged: NA HH Agency: NA         Social Determinants of Health (SDOH) Interventions    Readmission Risk Interventions Readmission Risk Prevention Plan 09/08/2018 06/28/2018  Transportation Screening Complete Complete  PCP or Specialist Appt within 3-5 Days - Complete  HRI or [place] - Complete  Social Work Consult for [place] Planning/Counseling - Complete  Palliative Care Screening - Not Applicable  Medication Review Press photographer) Complete Complete  PCP or Specialist appointment within 3-5 days of discharge Not Complete -  PCP/Specialist Appt Not Complete comments not yet ready for d/c -  HRI or [place] Not Complete -  HRI or Home Care Consult Pt Refusal Comments no needs identified at this time -  SW Recovery Care/Counseling Consult Not Complete -  SW  Consult Not Complete Comments no needs identified at this time -  Palliative Care Screening Not Applicable -  [place] Not Applicable -  Some recent data might be hidden

## 2018-10-10 NOTE — Progress Notes (Signed)
Nutrition Follow-up  DOCUMENTATION CODES:   Not applicable  INTERVENTION:   - Continue rena-vit daily  - Liberalize diet to Regular (verbal with readback order placed per Dr. Sherral Hammers)  - Add Ensure Enlive po BID, each supplement provides 350 kcal and 20 grams of protein  - Continue Magic cup TID with meals, each supplement provides 290 kcal and 9 grams of protein  NUTRITION DIAGNOSIS:   Inadequate oral intake related to inability to eat as evidenced by NPO status.  Progressing, pt is now on a Regular diet  GOAL:   Patient will meet greater than or equal to 90% of their needs  Progressing  MONITOR:   PO intake, Supplement acceptance, Labs, Weight trends, I & O's, Skin  REASON FOR ASSESSMENT:   Consult Enteral/tube feeding initiation and management  ASSESSMENT:   68 y.o. male with medical history significant of hypertension, hyperlipidemia, prediabetes, prostate cancer, metastasized bladder cancer on chemotherapy, PAF, CKD stage III, MSSA bacteremia.  Patient presented secondary to significant weakness overnight. Admitted for pneumonia.  6/08 - CRRT initiated 6/10 - extubated 6/12 - NGT placed, TF started 6/13 - CRRT d/c, transferred to [facility] 6/15 - diet advanced to Dysphagia 3 6/16 - s/p bone marrow biopsy with findings of no malignancy 6/17 - NGT removed 6/22 - diet advanced to Regular  Pt is no longer receiving HD. Noted plan for pt to d/c to SNF.  Overall, weight down 25 lbs since first measured weight on 6/03. Suspect some of weight loss is related to fluid but also suspect some dry weight loss.  Spoke with pt at bedside. Pt with untouched lunch meal tray. Pt states he looks at his tray and thinks "I'm just not hungry." Pt reports that his appetite decreased about 1 week ago. RD helped pt get set up with lunch and pt consumed some of vegetable soup while RD was in the room. Pt willing to try Ensure Enlive again. RD provided pt with a chocolate Ensure Enlive. Per  RN, pt also had an Ensure Enlive this AM.  Pt reports feeling dissatisfied with his food choices at meals. Discussed with Dr. Sherral Hammers. Diet liberalized to Regular.  Meal Completion: 0-100% x last 8 recorded meals (variable, averaging 51%)  Medications reviewed and include: SSI, rena-vit, Protonix, Miralax, vitamin B-6, sorbitol  Labs reviewed: sodium 129 CBG's: 97-130 x 24 hours  UOP: 825 ml x 24 hours I/O's: -21.4 L since admit  Diet Order:   Diet Order            Diet regular Room service appropriate? Yes; Fluid consistency: Thin  Diet effective now              EDUCATION NEEDS:   Education needs have been addressed  Skin:  Skin Assessment: Skin Integrity Issues: Incisions: closed incision to buttocks  Last BM:  10/09/18  Height:   Ht Readings from Last 1 Encounters:  10/08/18 '6\' 2"'  (1.88 m)    Weight:   Wt Readings from Last 1 Encounters:  10/10/18 104.3 kg    Ideal Body Weight:  86.3 kg  BMI:  Body mass index is 29.52 kg/m.  Estimated Nutritional Needs:   Kcal:  2380-2610 kcal  Protein:  125-140 grams  Fluid:  >/= 1.8 L    Gaynell Face, MS, RD, LDN Inpatient Clinical Dietitian Pager: [phone] Weekend/After Hours: [phone]

## 2018-10-10 NOTE — Progress Notes (Signed)
Physical Therapy Treatment Patient Details Name: [name] Rose MRN: [id number] DOB: Aug 18, 1950 Today's Date: 10/10/2018    History of Present Illness Pt is a 68 y/o male presenting with respiratory failure and fever secondary to presumed pneumonia. SARS virus test negative x 2 Intubated 6/2 thru 6/10, CRRT while in ICU.  Course complicated by anemia and atrial fib with RVR. PMH: prostate cancer with bladder mets, CKD, HTN, hyperglycemia.     PT Comments    Pt to discharge to SNF tomorrow. Given pt need for increased assistance for mobility, PT utilized Maximove to move pt to chair so that he could participate in seated exercise. Pt requires active assist for LE ROM exercise. With trunk rotation and reaching outside of BoS pt HR increased to 153 bpm and 3/4 DoE, SaO2 88% on 2L O2 via [place]. Exercises suspended. D/c plans appropriate.      Follow Up Recommendations  SNF     Equipment Recommendations  (TBA)       Precautions / Restrictions Precautions Precautions: Fall Restrictions Weight Bearing Restrictions: No    Mobility  Bed Mobility Overal bed mobility: Needs Assistance Bed Mobility: Rolling Rolling: Mod assist;+2 for physical assistance         General bed mobility comments: modAx2 for rolling for Maximove pad placement   Transfers Overall transfer level: Needs assistance               General transfer comment: total assist for use of Maximove to get to chair   Ambulation/Gait             General Gait Details: unable          Balance Overall balance assessment: Needs assistance Sitting-balance support: Feet supported;Single extremity supported Sitting balance-Leahy Scale: Fair Sitting balance - Comments: min guard for sitting on edge of recliner for therapeutic exercise                                    Cognition Arousal/Alertness: Awake/alert Behavior During Therapy: [facility] for tasks assessed/performed                                           Exercises General Exercises - Upper Extremity Elbow Flexion: AROM;Both;10 reps;Seated General Exercises - Lower Extremity Ankle Circles/Pumps: Both;10 reps;Supine;AAROM;AROM Long Arc Quad: Both;Seated;AAROM;10 reps Hip ABduction/ADduction: AAROM;Both;10 reps;Seated Hip Flexion/Marching: Both;10 reps;[facility] Other Exercises Other Exercises: Trunk rotation with reaching 5x each side, pt with increased HR and 3/4 DoE     General Comments General comments (skin integrity, edema, etc.): HR from 110-153 bpm, max HR to 153bpm with trunk rotation exercise      Pertinent Vitals/Pain Pain Assessment: Faces Faces Pain Scale: Hurts even more Pain Location: bilateral knees with flexion  Pain Descriptors / Indicators: Grimacing;Guarding;Sharp(but brief) Pain Intervention(s): Limited activity within patient's tolerance;Monitored during session;Repositioned           PT Goals (current goals can now be found in the care plan section) Acute Rehab PT Goals Patient Stated Goal: to get stronger PT Goal Formulation: With patient Time For Goal Achievement: 10/24/18 Potential to Achieve Goals: Fair Progress towards PT goals: Progressing toward goals    Frequency    Min 3X/week      PT Plan Current plan remains appropriate       AM-PAC PT "6  Clicks" Mobility   Outcome Measure  Help needed turning from your back to your side while in a flat bed without using bedrails?: A Lot Help needed moving from lying on your back to sitting on the side of a flat bed without using bedrails?: A Lot Help needed moving to and from a bed to a chair (including a wheelchair)?: Total Help needed standing up from a chair using your arms (e.g., wheelchair or bedside chair)?: Total Help needed to walk in hospital room?: Total Help needed climbing 3-5 steps with a railing? : Total 6 Click Score: 8    End of Session Equipment Utilized During Treatment: Oxygen;Gait  belt Activity Tolerance: Patient limited by fatigue Patient left: with call bell/phone within reach;in bed(bed alarm would not set with bed in partial chair position) Nurse Communication: Mobility status;Need for lift equipment PT Visit Diagnosis: Unsteadiness on feet (R26.81);Other abnormalities of gait and mobility (R26.89);Muscle weakness (generalized) (M62.81)     Time: 1610-9604 PT Time Calculation (min) (ACUTE ONLY): 14 min  Charges:  $Therapeutic Exercise: 8-22 mins                     Kampbell Holaway B. Migdalia Dk PT, DPT Acute Rehabilitation Services Pager [phone] Office [phone]    [place] 10/10/2018, 4:46 PM

## 2018-10-10 NOTE — Progress Notes (Signed)
PROGRESS NOTE    [name] Rose  QTM:[id number] DOB: Jun 29, 1950 DOA: 09/08/2018 PCP: Shirline Frees, MD   Brief Narrative:  68 y.o. 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    Patient presented secondary to significant weakness overnight.  He reports no associated symptoms.  Weakness occurred acutely as he felt normal the day prior.  He reports receiving chemotherapy on a 2-week on 1 week off regimen.  Last round of chemotherapy was just over 1-1/2 weeks ago.  Patient reports not generally feeling this week with chemotherapy.  Weakness has improved slightly since coming to the hospital. Admitted 09/08/2018 with probable viral pneumonia-coronavirus 192 negative in addition to hypotension and AKI creatinine up to 2.3 from 1.7 baseline     Subjective: 6/30 A/O x4, negative CP, negative S OB, negative abdominal pain.  Overnight patient went into A. fib RVR.   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 Harrie Jeans nephrology does not anticipate patient requiring HD. -RIGHT IJ Vas-Cath removed 6/26   -Strict in and out -18.8 L -Daily weight Filed Weights   10/08/18 0426 10/09/18 0500 10/10/18 0545  Weight: 101.4 kg 109.1 kg 104.3 kg   Recent Labs  Lab 10/06/18 0315 10/07/18 0358 10/08/18 0438 10/09/18 0348 10/10/18  0322  CREATININE 2.55* 2.49* 2.60* 2.61* 2.73*  - 6/27 Vas-Cath removed area covered and clean negative sign of bleeding or infection.    Persistent atrial fibrillation -CHAD2VASC2 = 3 - Currently NSR - 6/30 increase Cardizem 60 mg QID -6/30 digoxin 0.125 mg x 1 dose -Metoprolol PRN -Lovenox 40 mg daily  Essential HTN -See A. Fib -Patient has actually been somewhat hypotensive with A. fib RVR.  Hypokalemia - Potassium goal> 4  Hypomagnesmia - Magnesium goal> 2  Metastatic bladder and prostate cancer - Oncology [name] managing - 6/16 bone marrow biopsy left ilium negative cancer cells see results below - Per [name] note spoke with wife concerning future oncology issues.    Diabetes type 2 controlled with complication - 6/25 hemoglobin A1c= 4.9 - Sensitive SSI  HLD - LDL not within ADA/AHA guidelines - Lipitor 40 mg daily - LDL goal<70  Viral pneumonia? -Supportive care -Patient received small dose of antibiotics but discontinued. - BAL 6/2 showed Candida albicans, not treated  Dysphasia oral pharyngeal - Resolved - Renal/carb modified  Severe muscle deconditioning - Ambulate patient in hallway daily   Goals of care -6/27 consult to physical medicine for evaluation for CIR placement: Inpatient rehab feels patient not appropriate for CIR. - 6/29 consult social work: Awaiting SNF placement -6/29 SNF coronavirus pending    DVT prophylaxis: Lovenox Code Status: Full Family Communication: 6/29 spoke with Ms. Bellomo answered all questions concerning plan of care Disposition Plan: TBD   Consultants:  Nephrology Oncology    Procedures/Significant Events:  6/16:Bone Marrow, Aspirate,Biopsy, left  ilium - MILDLY HYPERCELLULAR MARROW WITH MEGAKARYOCYTIC HYPOPLASIA AND DYSPLASIA. - he marrow is mildly hypercellular. Megakaryocytes are reduced and dysplastic. There is very mild dysplasia in the erythroid cells. Overall, these changes are suggestive  of a low-grade myelodysplastic syndrome, although non-neoplastic causes of dysplasia should be excluded. Correlation with FISH is recommended.   I have personally reviewed and interpreted all radiology studies and my findings are as above.  VENTILATOR SETTINGS:    Cultures 5/29 blood right Port-A-Cath negative 5/29 blood left antecubital negative 5/29 urine insignificant growth 5/29 MRSA by PCR negative 6/2 BAL positive Candida albicans 6/29 coronavirus negative     Antimicrobials: Anti-infectives (From admission, onward)   Start     Stop   09/15/18 0600  ceFEPIme (MAXIPIME) 2 g in sodium chloride 0.9 % 100 mL IVPB  Status:  Discontinued     09/16/18 0957   09/11/18 1200  sulfamethoxazole-trimethoprim (BACTRIM) 560 mg in dextrose 5 % 500 mL IVPB  Status:  Discontinued     09/13/18 1309   09/10/18 1800  azithromycin (ZITHROMAX) 500 mg in sodium chloride 0.9 % 250 mL IVPB  Status:  Discontinued     09/16/18 0957   09/09/18 1800  vancomycin (VANCOCIN) 1,250 mg in sodium chloride 0.9 % 250 mL IVPB  Status:  Discontinued     09/08/18 1744   09/08/18 1800  vancomycin (VANCOCIN) 2,000 mg in sodium chloride 0.9 % 500 mL IVPB  Status:  Discontinued     09/08/18 1744   09/08/18 1800  ceFEPIme (MAXIPIME) 2 g in sodium chloride 0.9 % 100 mL IVPB  Status:  Discontinued     09/14/18 0816   09/08/18 0500  vancomycin (VANCOCIN) IVPB 1000 mg/200 mL premix     09/08/18 0612   09/08/18 0500  ceFEPIme (MAXIPIME) 2 g in sodium chloride 0.9 % 100 mL IVPB     09/08/18 0544       Devices    LINES / TUBES:  -RIGHT IJ Vas-Cath removed 6/26      Continuous Infusions:    Objective: Vitals:   10/10/18 0117 10/10/18 0317 10/10/18 0545 10/10/18 0734  BP:  110/64  111/74  Pulse: (!) 102 100  95  Resp: (!) _0 Temp: 99.7 F (37.6 C) 98.4 F (36.9 C)  98.5 F (36.9 C)  TempSrc: Oral Oral  Oral  SpO2: 96% 98%  99%  Weight:   104.3 kg   Height:        Intake/Output Summary  (Last 24 hours) at 10/10/2018 0814 Last data filed at 10/10/2018 0545 Gross per 24 hour  Intake 280 ml  Output 575 ml  Net -295 ml   Filed Weights   10/08/18 0426 10/09/18 0500 10/10/18 0545  Weight: 101.4 kg 109.1 kg 104.3 kg   Physical Exam:  General: A/O x4 no acute respiratory distress Eyes: negative scleral hemorrhage, negative anisocoria, negative icterus ENT: Negative Runny nose, negative gingival bleeding, Neck:  Negative scars, masses, torticollis, lymphadenopathy, JVD Lungs: Clear to auscultation bilaterally without wheezes or crackles Cardiovascular: Regular irregular rhythm and rate, without murmur gallop or rub normal S1 and S2 Abdomen: negative abdominal pain, nondistended, positive soft, bowel sounds, no rebound, no ascites, no appreciable mass Extremities: No significant cyanosis, clubbing, or edema bilateral lower extremities Skin: Negative rashes, lesions, ulcers Psychiatric:  Negative depression, negative anxiety, negative fatigue, negative mania  Central nervous system:  Cranial nerves II through XII intact, tongue/uvula midline, all extremities muscle strength 5/5, sensation intact throughout,  negative dysarthria, negative expressive aphasia, negative receptive aphasia.       Data Reviewed: Care during the described time interval was provided by me .  I have reviewed this patient's available data, including medical history, events of note, physical examination, and all test results as part of my evaluation.   CBC: Recent Labs  Lab 10/05/18 0411 10/06/18 0315 10/07/18 0358 10/08/18 0438 10/09/18 0348  WBC 7.9 8.1 12.2* 11.0* 13.3*  NEUTROABS 5.5 6.1 10.5* 8.6* 11.1*  HGB 8.5* 8.6* 9.1* 8.1* 8.5*  HCT 26.4* 26.4* 27.8* 24.8* 26.3*  MCV 101.5* 99.6 100.0 100.0 99.2  PLT 76* 90* 100* 103* 063*   Basic Metabolic Panel: Recent Labs  Lab 10/05/18 0411 10/06/18 0315 10/07/18 0358 10/08/18 0438 10/09/18 0348 10/10/18 0322  NA 137 136 135 131* 132*  129*  K 3.7 3.4* 3.9 3.5 3.9 3.6  CL 100 99 99 94* 96* 96*  CO2 _0 GLUCOSE 104* 112* 153* 128* 123* 105*  BUN 49* 51* 47* 47* 46* 44*  CREATININE 2.53* 2.55* 2.49* 2.60* 2.61* 2.73*  CALCIUM 8.4* 8.5* 8.6* 8.1* 8.1* 7.7*  MG 1.6* 1.6* 1.7 1.6* 1.9  --   PHOS 3.5 3.2 3.1 3.5 3.2 3.2   GFR: Estimated Creatinine Clearance: 33.8 mL/min (A) (by C-G formula based on SCr of 2.73 mg/dL (H)). Liver Function Tests: Recent Labs  Lab 10/06/18 0315 10/07/18 0358 10/08/18 0438 10/09/18 0348 10/10/18 0322  ALBUMIN 2.6* 2.5* 2.2* 2.0* 1.9*   No results for input(s): LIPASE, AMYLASE in the last 168 hours. No results for input(s): AMMONIA in the last 168 hours. Coagulation Profile: No results for input(s): INR, PROTIME in the last 168 hours. Cardiac Enzymes: No results for input(s): CKTOTAL, CKMB, CKMBINDEX, TROPONINI in the last 168 hours. BNP (last 3 results) No results for input(s): PROBNP in the last 8760 hours. HbA1C: No results for input(s): HGBA1C in the last 72 hours. CBG: Recent Labs  Lab 10/09/18 0601 10/09/18 1118 10/09/18 1553 10/09/18 2053 10/10/18 0601  GLUCAP 122* 141* 117* 130* 97   Lipid Profile: No results for input(s): CHOL, HDL, LDLCALC, TRIG, CHOLHDL, LDLDIRECT in the last 72 hours. Thyroid Function Tests: No results for input(s): TSH, T4TOTAL, FREET4, T3FREE, THYROIDAB in the last 72 hours. Anemia Panel: Recent Labs    10/08/18 1233  FERRITIN 1,669*  TIBC 176*  IRON 9*   Urine analysis:    Component Value Date/Time   COLORURINE YELLOW 09/18/2018 1302   APPEARANCEUR CLOUDY (A) 09/18/2018 1302   LABSPEC 1.011 09/18/2018 1302   PHURINE 5.0 09/18/2018 1302   GLUCOSEU >=500 (A) 09/18/2018 1302   HGBUR MODERATE (A) 09/18/2018 1302   BILIRUBINUR NEGATIVE 09/18/2018 1302   KETONESUR NEGATIVE 09/18/2018 1302   PROTEINUR NEGATIVE 09/18/2018 1302   UROBILINOGEN 0.2 01/31/2010 0624   NITRITE NEGATIVE 09/18/2018 1302   LEUKOCYTESUR NEGATIVE  09/18/2018 1302   Sepsis Labs: _1 (procalcitonin:4,lacticidven:4)  ) Recent Results (from the past 240 hour(s))  Novel Coronavirus, NAA (hospital order; send-out to ref lab)     Status: None   Collection Time: 10/09/18  8:39 AM   Specimen: Nasopharyngeal Swab; Respiratory  Result Value Ref Range Status   SARS-CoV-2, NAA NOT DETECTED NOT DETECTED Final    Comment: (NOTE) This test was developed and its performance characteristics determined by [employer]. This test has not been FDA cleared or approved. This test has been authorized by FDA under an Emergency Use Authorization (EUA). This test is only authorized for  the duration of time the declaration that circumstances exist justifying the authorization of the emergency use of in vitro diagnostic tests for detection of SARS-CoV-2 virus and/or diagnosis of COVID-19 infection under section 564(b)(1) of the Act, 21 U.S.C. 546TKP-5(W)(6), unless the authorization is terminated or revoked sooner. When diagnostic testing is negative, the possibility of a false negative result should be considered in the context of a patient's recent exposures and the presence of clinical signs and symptoms consistent with COVID-19. An individual without symptoms of COVID-19 and who is not shedding SARS-CoV-2 virus would expect to have a negative (not detected) result in this assay. Performed  At: [facility] [address] [place], [place] [id number] Rush Farmer MD GY:[id number]    [place]  Final    Comment: Performed at [facility], [place] [address], [place], [place] [address]         Radiology Studies: No results found.      Scheduled Meds: . atorvastatin  40 mg Oral q1800  . chlorhexidine  15 mL Mouth Rinse BID  . diltiazem  45 mg Per Tube Q6H  . enoxaparin (LOVENOX) injection  40 mg Subcutaneous Q24H  . gabapentin  400 mg Oral TID  . Gerhardt's butt cream   Topical Daily   . insulin aspart  0-9 Units Subcutaneous TID WC  . multivitamin  1 tablet Oral QHS  . pantoprazole  40 mg Oral Daily  . polyethylene glycol  17 g Oral Daily  . vitamin B-6  100 mg Oral Daily  . sorbitol  20 mL Oral Daily   Continuous Infusions:    LOS: 32 days   The patient is critically ill with multiple organ systems failure and requires high complexity decision making for assessment and support, frequent evaluation and titration of therapies, application of advanced monitoring technologies and extensive interpretation of multiple databases. Critical Care Time devoted to patient care services described in this note  Time spent: 40 minutes     Britiny Defrain, Geraldo Docker, MD [facility] Pager [phone]  If 7PM-7AM, please contact night-coverage [email or web address] Password [email or web address] 10/10/2018, 8:14 AM

## 2018-10-11 DIAGNOSIS — N171 Acute kidney failure with acute cortical necrosis: Secondary | ICD-10-CM

## 2018-10-11 LAB — CBC WITH DIFFERENTIAL/PLATELET
Abs Immature Granulocytes: 0.2 10*3/uL — ABNORMAL HIGH (ref 0.00–0.07)
Basophils Absolute: 0 10*3/uL (ref 0.0–0.1)
Basophils Relative: 0 %
Eosinophils Absolute: 0 10*3/uL (ref 0.0–0.5)
Eosinophils Relative: 0 %
HCT: 23.9 % — ABNORMAL LOW (ref 39.0–52.0)
Hemoglobin: 7.7 g/dL — ABNORMAL LOW (ref 13.0–17.0)
Immature Granulocytes: 2 %
Lymphocytes Relative: 8 %
Lymphs Abs: 0.8 10*3/uL (ref 0.7–4.0)
MCH: 31.6 pg (ref 26.0–34.0)
MCHC: 32.2 g/dL (ref 30.0–36.0)
MCV: 98 fL (ref 80.0–100.0)
Monocytes Absolute: 1.8 10*3/uL — ABNORMAL HIGH (ref 0.1–1.0)
Monocytes Relative: 18 %
Neutro Abs: 6.7 10*3/uL (ref 1.7–7.7)
Neutrophils Relative %: 72 %
Platelets: 80 10*3/uL — ABNORMAL LOW (ref 150–400)
RBC: 2.44 MIL/uL — ABNORMAL LOW (ref 4.22–5.81)
RDW: 18.3 % — ABNORMAL HIGH (ref 11.5–15.5)
WBC: 9.5 10*3/uL (ref 4.0–10.5)
nRBC: 0 % (ref 0.0–0.2)

## 2018-10-11 LAB — RENAL FUNCTION PANEL
Albumin: 1.9 g/dL — ABNORMAL LOW (ref 3.5–5.0)
Anion gap: 14 (ref 5–15)
BUN: 49 mg/dL — ABNORMAL HIGH (ref 8–23)
CO2: 23 mmol/L (ref 22–32)
Calcium: 7.9 mg/dL — ABNORMAL LOW (ref 8.9–10.3)
Chloride: 92 mmol/L — ABNORMAL LOW (ref 98–111)
Creatinine, Ser: 2.86 mg/dL — ABNORMAL HIGH (ref 0.61–1.24)
GFR calc Af Amer: 25 mL/min — ABNORMAL LOW (ref >60)
GFR calc non Af Amer: 22 mL/min — ABNORMAL LOW (ref >60)
Glucose, Bld: 131 mg/dL — ABNORMAL HIGH (ref 70–99)
Phosphorus: 3.2 mg/dL (ref 2.5–4.6)
Potassium: 3.7 mmol/L (ref 3.5–5.1)
Sodium: 129 mmol/L — ABNORMAL LOW (ref 135–145)

## 2018-10-11 LAB — FUNGAL ORGANISM REFLEX

## 2018-10-11 LAB — GLUCOSE, CAPILLARY
Glucose-Capillary: 119 mg/dL — ABNORMAL HIGH (ref 70–99)
Glucose-Capillary: 133 mg/dL — ABNORMAL HIGH (ref 70–99)
Glucose-Capillary: 160 mg/dL — ABNORMAL HIGH (ref 70–99)
Glucose-Capillary: 106 mg/dL — ABNORMAL HIGH (ref 70–99)

## 2018-10-11 LAB — LACTATE DEHYDROGENASE: LDH: 277 U/L — ABNORMAL HIGH (ref 98–192)

## 2018-10-11 LAB — FUNGUS CULTURE WITH STAIN

## 2018-10-11 LAB — FUNGUS CULTURE RESULT

## 2018-10-11 NOTE — Progress Notes (Signed)
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  Patient presented secondary to significant weakness overnight. He reports no associated symptoms. Weakness occurred acutely as he felt normal the day prior. He reports receiving chemotherapy on a 2-week on 1 week off regimen. Last round of chemotherapy was just over 1-1/2 weeks ago. Patient reports not generally feeling this week with chemotherapy. Weakness has improved slightly since coming to the hospital. Admitted 09/08/2018 with probable viral pneumonia-coronavirus 192 negative in addition to hypotension and AKI creatinine up to 2.3 from 1.7 baseline  Assessment & Plan:   Principal Problem:   Pneumonia Active Problems:   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 (HCC)   LFT elevation   AKI (acute kidney injury) ([place])   CKD (chronic kidney disease), stage III (HCC)   Acute respiratory failure with hypoxia (HCC)   Acute renal failure (HCC)   Acute renal failure with acute tubular necrosis superimposed on stage 3 chronic kidney disease ([place])   Metastatic cancer ([place])   Palliative care encounter   Chronic atrial fibrillation   Severe muscle deconditioning  Acute renal failure with ATN superimposed on CKD stage III (baseline Cr 1.4-1.5) - 6/26 per Dr. Harrie Jeans nephrology does not anticipate patient requiring HD. -RIGHT IJ Vas-Cath removed 6/26   -Strict in and out -18.8 L - 6/27 Vas-Cath removed area covered and clean negative sign of bleeding or infection.   - Nephrology has since signed off as of 6/29 -Cr seems stable. Repeat BMET in AM  Persistent atrial fibrillation -CHAD2VASC2 = 3 - Currently NSR - 6/30 increased Cardizem 60 mg QID -6/30 digoxin 0.125 mg given x 1 dose -Continued  on Metoprolol PRN - Continued on 48m lovenox daily  Essential HTN -as per above -BP borderline low, but stable  Hypokalemia - Potassium goal> 4 -Will repeat bmet in AM  Hypomagnesmia - Magnesium goal> 2 -Continue to follow lytes  Metastatic bladder and prostate cancer - Oncology Dr. EMarin Olpmanaging - 6/16 bone marrow biopsy left ilium negative cancer cells see results below - Per Dr. EMarin Olpnote, spoke with wife concerning future oncology issues.    Diabetes type 2 controlled with complication - 63/55hemoglobin A1c= 4.9 - Continue on sensitive SSI  HLD - LDL not within ADA/AHA guidelines - Lipitor continued at 40 mg daily - LDL goal<70  Viral pneumonia? -Supportive care -Patient received small dose of antibiotics but discontinued. - BAL 6/2 showed Candida albicans, not treated -[facility] Dysphasia oral pharyngeal - Resolved - Continue on Renal/carb modified  Severe muscle deconditioning - per PT, pt needs increased assistance for mobility thus needing  SNF per PT recs - Pt is motivated to rehab and states    Goals of care -6/27 consult to physical medicine for evaluation for CIR placement: Inpatient rehab feels patient not appropriate for CIR. - 6/29 consult social work: Awaiting SNF placement -6/29 SNF coronavirus pending  Stage 2 sacral decub ulcer, unknown if POA - Patient with open skin tear over sacral region - Recommend frequent turning and mobility  DVT prophylaxis: Lovenox subQ Code Status: Full Family Communication: pt in room, family not at bedside Disposition Plan: Uncertain at this time  Consultants:  Nephrology Oncology  Procedures:  6/16:Bone Marrow, Aspirate,Biopsy, left ilium - MILDLY HYPERCELLULAR MARROW WITH MEGAKARYOCYTIC HYPOPLASIA  AND DYSPLASIA. - he marrow is mildly hypercellular. Megakaryocytes are reduced and dysplastic. There is very mild dysplasia in the erythroid cells. Overall, these changes are  suggestive of a low-grade myelodysplastic syndrome, although non-neoplastic causes of dysplasia should be excluded. Correlation with FISH is recommended.  Antimicrobials: Anti-infectives (From admission, onward)   Start     Dose/Rate Route Frequency Ordered Stop   09/15/18 0600  ceFEPIme (MAXIPIME) 2 g in sodium chloride 0.9 % 100 mL IVPB  Status:  Discontinued     2 g 200 mL/hr over 30 Minutes Intravenous Every 24 hours 09/14/18 0816 09/16/18 0957   09/11/18 1200  sulfamethoxazole-trimethoprim (BACTRIM) 560 mg in dextrose 5 % 500 mL IVPB  Status:  Discontinued     560 mg 356.7 mL/hr over 90 Minutes Intravenous Every 8 hours 09/11/18 0948 09/13/18 1309   09/10/18 1800  azithromycin (ZITHROMAX) 500 mg in sodium chloride 0.9 % 250 mL IVPB  Status:  Discontinued     500 mg 250 mL/hr over 60 Minutes Intravenous Every 24 hours 09/10/18 1623 09/16/18 0957   09/09/18 1800  vancomycin (VANCOCIN) 1,250 mg in sodium chloride 0.9 % 250 mL IVPB  Status:  Discontinued     1,250 mg 166.7 mL/hr over 90 Minutes Intravenous Every 24 hours 09/08/18 1704 09/08/18 1744   09/08/18 1800  vancomycin (VANCOCIN) 2,000 mg in sodium chloride 0.9 % 500 mL IVPB  Status:  Discontinued     2,000 mg 250 mL/hr over 120 Minutes Intravenous  Once 09/08/18 1704 09/08/18 1744   09/08/18 1800  ceFEPIme (MAXIPIME) 2 g in sodium chloride 0.9 % 100 mL IVPB  Status:  Discontinued     2 g 200 mL/hr over 30 Minutes Intravenous Every 12 hours 09/08/18 1704 09/14/18 0816   09/08/18 0500  vancomycin (VANCOCIN) IVPB 1000 mg/200 mL premix     1,000 mg 200 mL/hr over 60 Minutes Intravenous  Once 09/08/18 0446 09/08/18 0612   09/08/18 0500  ceFEPIme (MAXIPIME) 2 g in sodium chloride 0.9 % 100 mL IVPB     2 g 200 mL/hr over 30 Minutes Intravenous  Once 09/08/18 0446 09/08/18 0544       Subjective: Eager to start therapy. Reports pain in the lower back making it difficult to work with PT  Objective: Vitals:   10/11/18 1200  10/11/18 1500 10/11/18 1600 10/11/18 1700  BP:   109/69   Pulse: (!) 106 86 98 (!) 109  Resp: (!) _0 (!) 25  Temp:   98.1 F (36.7 C)   TempSrc:   Oral   SpO2: 97% 100% 98% 95%  Weight:      Height:        Intake/Output Summary (Last 24 hours) at 10/11/2018 1809 Last data filed at 10/11/2018 1700 Gross per 24 hour  Intake 960 ml  Output 900 ml  Net 60 ml   Filed Weights   10/09/18 0500 10/10/18 0545 10/11/18 0500  Weight: 109.1 kg 104.3 kg 103.6 kg    Examination:  General exam: Appears calm and comfortable  Respiratory system: Clear to auscultation. Respiratory effort normal. Cardiovascular system: S1 & S2 heard, irregularly irregular Gastrointestinal system: Abdomen is nondistended, soft and nontender. No organomegaly or masses felt. Normal bowel sounds heard. Central nervous system: Alert and oriented. No focal neurological deficits. Extremities: Symmetric 5 x 5 power. Skin: No rashes, lesions, stage 2 sacral decub Psychiatry: Judgement and insight appear normal. Mood & affect appropriate.   Data Reviewed: I have personally reviewed  following labs and imaging studies  CBC: Recent Labs  Lab 10/06/18 0315 10/07/18 0358 10/08/18 0438 10/09/18 0348 10/11/18 0330  WBC 8.1 12.2* 11.0* 13.3* 9.5  NEUTROABS 6.1 10.5* 8.6* 11.1* 6.7  HGB 8.6* 9.1* 8.1* 8.5* 7.7*  HCT 26.4* 27.8* 24.8* 26.3* 23.9*  MCV 99.6 100.0 100.0 99.2 98.0  PLT 90* 100* 103* 110* 80*   Basic Metabolic Panel: Recent Labs  Lab 10/05/18 0411 10/06/18 0315 10/07/18 0358 10/08/18 0438 10/09/18 0348 10/10/18 0322 10/11/18 0330  NA 137 136 135 131* 132* 129* 129*  K 3.7 3.4* 3.9 3.5 3.9 3.6 3.7  CL 100 99 99 94* 96* 96* 92*  CO2 _0 GLUCOSE 104* 112* 153* 128* 123* 105* 131*  BUN 49* 51* 47* 47* 46* 44* 49*  CREATININE 2.53* 2.55* 2.49* 2.60* 2.61* 2.73* 2.86*  CALCIUM 8.4* 8.5* 8.6* 8.1* 8.1* 7.7* 7.9*  MG 1.6* 1.6* 1.7 1.6* 1.9  --   --   PHOS 3.5 3.2 3.1 3.5 3.2  3.2 3.2   GFR: Estimated Creatinine Clearance: 32.2 mL/min (A) (by C-G formula based on SCr of 2.86 mg/dL (H)). Liver Function Tests: Recent Labs  Lab 10/07/18 0358 10/08/18 0438 10/09/18 0348 10/10/18 0322 10/11/18 0330  ALBUMIN 2.5* 2.2* 2.0* 1.9* 1.9*   No results for input(s): LIPASE, AMYLASE in the last 168 hours. No results for input(s): AMMONIA in the last 168 hours. Coagulation Profile: No results for input(s): INR, PROTIME in the last 168 hours. Cardiac Enzymes: No results for input(s): CKTOTAL, CKMB, CKMBINDEX, TROPONINI in the last 168 hours. BNP (last 3 results) No results for input(s): PROBNP in the last 8760 hours. HbA1C: No results for input(s): HGBA1C in the last 72 hours. CBG: Recent Labs  Lab 10/10/18 1633 10/10/18 2123 10/11/18 0631 10/11/18 1103 10/11/18 1644  GLUCAP 127* 132* 119* 133* 160*   Lipid Profile: No results for input(s): CHOL, HDL, LDLCALC, TRIG, CHOLHDL, LDLDIRECT in the last 72 hours. Thyroid Function Tests: No results for input(s): TSH, T4TOTAL, FREET4, T3FREE, THYROIDAB in the last 72 hours. Anemia Panel: No results for input(s): VITAMINB12, FOLATE, FERRITIN, TIBC, IRON, RETICCTPCT in the last 72 hours. Sepsis Labs: No results for input(s): PROCALCITON, LATICACIDVEN in the last 168 hours.  Recent Results (from the past 240 hour(s))  Novel Coronavirus, NAA (hospital order; send-out to ref lab)     Status: None   Collection Time: 10/09/18  8:39 AM   Specimen: Nasopharyngeal Swab; Respiratory  Result Value Ref Range Status   SARS-CoV-2, NAA NOT DETECTED NOT DETECTED Final    Comment: (NOTE) This test was developed and its performance characteristics determined by [employer]. This test has not been FDA cleared or approved. This test has been authorized by FDA under an Emergency Use Authorization (EUA). This test is only authorized for the duration of time the declaration that circumstances exist justifying the  authorization of the emergency use of in vitro diagnostic tests for detection of SARS-CoV-2 virus and/or diagnosis of COVID-19 infection under section 564(b)(1) of the Act, 21 U.S.C. 245YKD-9(I)(3), unless the authorization is terminated or revoked sooner. When diagnostic testing is negative, the possibility of a false negative result should be considered in the context of a patient's recent exposures and the presence of clinical signs and symptoms consistent with COVID-19. An individual without symptoms of COVID-19 and who is not shedding SARS-CoV-2 virus would expect to have a negative (not detected) result in this assay. Performed  At: [facility]  [place], [place] [id number] Rush Farmer MD CO:[id number]    Coronavirus Source NASOPHARYNGEAL  Final    Comment: Performed at [facility], [place] [address], [place], [place] [address]     Radiology Studies: No results found.  Scheduled Meds: . atorvastatin  40 mg Oral q1800  . chlorhexidine  15 mL Mouth Rinse BID  . diltiazem  60 mg Oral Q6H  . enoxaparin (LOVENOX) injection  40 mg Subcutaneous Q24H  . feeding supplement (ENSURE ENLIVE)  237 mL Oral BID BM  . gabapentin  400 mg Oral TID  . Gerhardt's butt cream   Topical Daily  . insulin aspart  0-9 Units Subcutaneous TID WC  . multivitamin  1 tablet Oral QHS  . pantoprazole  40 mg Oral Daily  . polyethylene glycol  17 g Oral Daily  . vitamin B-6  100 mg Oral Daily  . sorbitol  20 mL Oral Daily   Continuous Infusions:   LOS: 33 days   Marylu Lund, MD [facility] Pager On Amion  If 7PM-7AM, please contact night-coverage 10/11/2018, 6:09 PM

## 2018-10-11 NOTE — Plan of Care (Signed)
  Problem: Education: Goal: Knowledge of General Education information will improve Description: Including pain rating scale, medication(s)/side effects and non-pharmacologic comfort measures Outcome: Progressing   Problem: Health Behavior/Discharge Planning: Goal: Ability to manage health-related needs will improve Outcome: Progressing   Problem: Clinical Measurements: Goal: Ability to maintain clinical measurements within normal limits will improve Outcome: Progressing Goal: Will remain free from infection Outcome: Progressing Goal: Diagnostic test results will improve Outcome: Progressing Goal: Respiratory complications will improve Outcome: Progressing Goal: Cardiovascular complication will be avoided Outcome: Progressing   Problem: Activity: Goal: Risk for activity intolerance will decrease Outcome: Progressing   Problem: Nutrition: Goal: Adequate nutrition will be maintained Outcome: Progressing   Problem: Coping: Goal: Level of anxiety will decrease Outcome: Progressing   Problem: Elimination: Goal: Will not experience complications related to bowel motility Outcome: Progressing Goal: Will not experience complications related to urinary retention Outcome: Progressing   Problem: Safety: Goal: Ability to remain free from injury will improve Outcome: Progressing   Problem: Skin Integrity: Goal: Risk for impaired skin integrity will decrease Outcome: Progressing   Problem: Activity: Goal: Ability to tolerate increased activity will improve Outcome: Progressing   Problem: Clinical Measurements: Goal: Ability to maintain a body temperature in the normal range will improve Outcome: Progressing   Problem: Respiratory: Goal: Ability to maintain adequate ventilation will improve Outcome: Progressing Goal: Ability to maintain a clear airway will improve Outcome: Progressing

## 2018-10-11 NOTE — Progress Notes (Signed)
Pt was getting up to the chair with the help of Maxi-move. Pt can roll up side to side but can't stand fully on his feet.During the transfer from bed to chair, pt's heart rate was on 90's. Oxygen 97% with 2l of oxygen via [place], will continue to monitor the patient.  Palma Holter, RN

## 2018-10-11 NOTE — TOC Progression Note (Signed)
Transition of Care [facility]) - Progression Note    Patient Details  Name: [name] Rose MRN: [id number] Date of Birth: 09-29-1950  Transition of Care [facility]) CM/SW [place], LCSW Phone Number: 10/11/2018, 12:12 PM  Clinical Narrative: [employer] has given an initial denial for insurance authorization to SNF. Their medical director is willing to do a peer-to-peer review. Sent message to MD with his contact information if he is willing to do it. CSW has called and updated patient's wife. SNF admissions coordinator is aware.   Expected Discharge Plan: Skilled Nursing Facility Barriers to Discharge: Continued Medical Work up  Expected Discharge Plan and Services Expected Discharge Plan: [place] In-house Referral: Clinical Social Work Discharge Planning Services: NA Post Acute Care Choice: [place] Living arrangements for the past 2 months: Single Family Home Expected Discharge Date: (unknown)               DME Arranged: N/A DME Agency: NA       HH Arranged: NA HH Agency: NA         Social Determinants of Health (SDOH) Interventions    Readmission Risk Interventions Readmission Risk Prevention Plan 09/08/2018 06/28/2018  Transportation Screening Complete Complete  PCP or Specialist Appt within 3-5 Days - Complete  HRI or [place] - Complete  Social Work Consult for [place] Planning/Counseling - Complete  Palliative Care Screening - Not Applicable  Medication Review Press photographer) Complete Complete  PCP or Specialist appointment within 3-5 days of discharge Not Complete -  PCP/Specialist Appt Not Complete comments not yet ready for d/c -  HRI or [place] Not Complete -  HRI or Home Care Consult Pt Refusal Comments no needs identified at this time -  SW Recovery Care/Counseling Consult Not Complete -  SW Consult Not Complete Comments no needs identified at this time -  Palliative Care Screening  Not Applicable -  [place] Not Applicable -  Some recent data might be hidden

## 2018-10-11 NOTE — Care Management Important Message (Signed)
Important Message  Patient Details  Name: [name] Rose MRN: [id number] Date of Birth: 12-Nov-1950   Medicare Important Message Given:  Yes     Memory Argue 10/11/2018, 3:52 PM

## 2018-10-12 LAB — GLUCOSE, CAPILLARY
Glucose-Capillary: 89 mg/dL (ref 70–99)
Glucose-Capillary: 127 mg/dL — ABNORMAL HIGH (ref 70–99)
Glucose-Capillary: 127 mg/dL — ABNORMAL HIGH (ref 70–99)
Glucose-Capillary: 126 mg/dL — ABNORMAL HIGH (ref 70–99)

## 2018-10-12 LAB — CBC WITH DIFFERENTIAL/PLATELET
Abs Immature Granulocytes: 0.51 10*3/uL — ABNORMAL HIGH (ref 0.00–0.07)
Basophils Absolute: 0 10*3/uL (ref 0.0–0.1)
Basophils Relative: 0 %
Eosinophils Absolute: 0 10*3/uL (ref 0.0–0.5)
Eosinophils Relative: 0 %
HCT: 22.3 % — ABNORMAL LOW (ref 39.0–52.0)
Hemoglobin: 7.1 g/dL — ABNORMAL LOW (ref 13.0–17.0)
Immature Granulocytes: 4 %
Lymphocytes Relative: 9 %
Lymphs Abs: 1.1 10*3/uL (ref 0.7–4.0)
MCH: 31.8 pg (ref 26.0–34.0)
MCHC: 31.8 g/dL (ref 30.0–36.0)
MCV: 100 fL (ref 80.0–100.0)
Monocytes Absolute: 1.8 10*3/uL — ABNORMAL HIGH (ref 0.1–1.0)
Monocytes Relative: 15 %
Neutro Abs: 8.7 10*3/uL — ABNORMAL HIGH (ref 1.7–7.7)
Neutrophils Relative %: 72 %
Platelets: 90 10*3/uL — ABNORMAL LOW (ref 150–400)
RBC: 2.23 MIL/uL — ABNORMAL LOW (ref 4.22–5.81)
RDW: 18.5 % — ABNORMAL HIGH (ref 11.5–15.5)
WBC: 12.1 10*3/uL — ABNORMAL HIGH (ref 4.0–10.5)
nRBC: 0 % (ref 0.0–0.2)

## 2018-10-12 LAB — RENAL FUNCTION PANEL
Albumin: 1.6 g/dL — ABNORMAL LOW (ref 3.5–5.0)
Anion gap: 10 (ref 5–15)
BUN: 48 mg/dL — ABNORMAL HIGH (ref 8–23)
CO2: 26 mmol/L (ref 22–32)
Calcium: 7.8 mg/dL — ABNORMAL LOW (ref 8.9–10.3)
Chloride: 95 mmol/L — ABNORMAL LOW (ref 98–111)
Creatinine, Ser: 2.92 mg/dL — ABNORMAL HIGH (ref 0.61–1.24)
GFR calc Af Amer: 25 mL/min — ABNORMAL LOW (ref >60)
GFR calc non Af Amer: 21 mL/min — ABNORMAL LOW (ref >60)
Glucose, Bld: 97 mg/dL (ref 70–99)
Phosphorus: 4 mg/dL (ref 2.5–4.6)
Potassium: 3.4 mmol/L — ABNORMAL LOW (ref 3.5–5.1)
Sodium: 131 mmol/L — ABNORMAL LOW (ref 135–145)

## 2018-10-12 LAB — LACTATE DEHYDROGENASE: LDH: 231 U/L — ABNORMAL HIGH (ref 98–192)

## 2018-10-12 MED ORDER — DARBEPOETIN ALFA 300 MCG/0.6ML IJ SOSY
300.0000 ug | PREFILLED_SYRINGE | Freq: Once | INTRAMUSCULAR | Status: AC
  Administered 2018-10-12: 300 ug via SUBCUTANEOUS
  Filled 2018-10-12: qty 0.6

## 2018-10-12 NOTE — Progress Notes (Signed)
[name] Rose is about the same.  He still has not required dialysis.  His renal function is holding steady.  The real problem now is the physical therapy.  He really wants to try and continue with physical therapy.  He does have a neuropathy in his feet.  I did change his Neurontin around a little bit.  What will help I think quite a bit is having him wear tennis shoes when he does physical therapy.  I am not sure how we can get tennis shoes for him or use what ever shoes he came in with to the hospital.  His hemoglobin is dropping.  I am sure this is from lack of erythropoietin secondary to his renal insufficiency.  I will give him a dose of Aranesp today.  He got iron a few days ago.  This should help the Aranesp.  He might need another dose of iron given the low iron saturation.  His appetite is marginal.  He is tired of eating the same thing.  He has had no nausea or vomiting.  There is been no chest pain.  He has had no shortness of breath.  His vital signs look pretty good.  His temperature is 98.  Pulse 89.  Blood pressure 97/68.  His labs show a BUN of 48 creatinine 2.92.  Creatinine is going up slowly.  The LDH is 231.  His hemoglobin is 7.1.  White cell count 12.1.  Platelet count 90,000.  I do think that if his hemoglobin drops further at that he will need to have a transfusion.  Again I suspect that he just does not have much erythropoietin secondary to his renal insufficiency.  Again, he really is motivated to do physical therapy.  If he can wear shoes while doing physical therapy I think that will help.  I very much appreciate the wonderful care that he is getting from everybody on [place]!!  [name] Haw, MD  Psalm 73:24

## 2018-10-12 NOTE — Plan of Care (Signed)
  Problem: Education: Goal: Knowledge of General Education information will improve Description: Including pain rating scale, medication(s)/side effects and non-pharmacologic comfort measures Outcome: Progressing   Problem: Health Behavior/Discharge Planning: Goal: Ability to manage health-related needs will improve Outcome: Progressing   Problem: Clinical Measurements: Goal: Ability to maintain clinical measurements within normal limits will improve Outcome: Progressing Goal: Will remain free from infection Outcome: Progressing Goal: Diagnostic test results will improve Outcome: Progressing Goal: Respiratory complications will improve Outcome: Progressing Goal: Cardiovascular complication will be avoided Outcome: Progressing   Problem: Activity: Goal: Risk for activity intolerance will decrease Outcome: Progressing   Problem: Nutrition: Goal: Adequate nutrition will be maintained Outcome: Progressing   Problem: Coping: Goal: Level of anxiety will decrease Outcome: Progressing   Problem: Elimination: Goal: Will not experience complications related to bowel motility Outcome: Progressing Goal: Will not experience complications related to urinary retention Outcome: Progressing   Problem: Safety: Goal: Ability to remain free from injury will improve Outcome: Progressing   Problem: Skin Integrity: Goal: Risk for impaired skin integrity will decrease Outcome: Progressing   Problem: Activity: Goal: Ability to tolerate increased activity will improve Outcome: Progressing   Problem: Clinical Measurements: Goal: Ability to maintain a body temperature in the normal range will improve Outcome: Progressing   Problem: Respiratory: Goal: Ability to maintain adequate ventilation will improve Outcome: Progressing Goal: Ability to maintain a clear airway will improve Outcome: Progressing

## 2018-10-12 NOTE — Progress Notes (Signed)
Inpatient Rehabilitation-Admissions Coordinator   Spoke with Dr. Amalia Hailey, medical reviewer at [facility]. As expected, Dr. Amalia Hailey agrees that this patient is not an appropriate candidate for CIR. He is open to a peer to peer conference with Dr. Earlie Counts for potential SNF placement. AC has messaged Dr. Wyline Copas to relay the conversation that was had with Dr. Amalia Hailey. SW aware of situation.   AC will sign off.   Please call if questions.   [name], OTR/L  Rehab Admissions Coordinator  [phone] 10/12/2018 10:52 AM

## 2018-10-12 NOTE — Progress Notes (Signed)
Pt sat in a recliner for 3 hours, tolerated well, Covid-19 test resent for possible SNF placement, wife updated, wound consultation is pending for his second degree pressure ulcer on his sacral area,  Will continue to monitor the patient [name] Holter, RN

## 2018-10-12 NOTE — Progress Notes (Signed)
PROGRESS NOTE    [name] Rose  MLY:[id number] DOB: [birth year] DOA: 09/08/2018 PCP: Shirline Frees, MD    Brief Narrative:  68 y.o.WM PMHx , HLD, Prediabetes, Prostate cancer, Metastasized bladder cancer on chemotherapy, PAF, CKD stage III, MSSA bacteremia.  Patient presented secondary to significant weakness overnight. He reports no associated symptoms. Weakness occurred acutely as he felt normal the day prior. He reports receiving chemotherapy on a 2-week on 1 week off regimen. Last round of chemotherapy was just over 1-1/2 weeks ago. Patient reports not generally feeling this week with chemotherapy. Weakness has improved slightly since coming to the hospital. 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 Harrie Jeans nephrology does not anticipate patient requiring HD. -RIGHT IJ Vas-Cath removed 6/26   -Strict in and out -18.8 L - 6/27 Vas-Cath removed area covered and clean negative sign of bleeding or infection.   - Nephrology has since signed off as of 6/29 - Cr today of 2.92. Will repeat BMET in AM  Persistent atrial fibrillation -CHAD2VASC2 = 3 - 6/30 increased Cardizem 60 mg QID -6/30 digoxin 0.125 mg given x 1 dose -Continued on  Metoprolol PRN - Continued on 86m lovenox daily --Remains rate controlled this AM  Essential HTN -as per above -BP stable, borderline  Hypokalemia - Potassium goal> 4 - K of 3.4 this AM. Will replace  Hypomagnesmia - Magnesium goal> 2 -Continue to follow lytes  Metastatic bladder and prostate cancer - Oncology Dr. EMarin Olpmanaging - 6/16 bone marrow biopsy left ilium negative cancer cells see results below - Discussed case with Dr. EMarin Olptoday, who is very well known to patient. Per Dr. EMarin Olp patient's baseline status is fully ambulatory and playing golf. Per Oncology, patient is expected to improve with intensive rehab - Patient has expressed to me strong desire to improve with therapy, limited only by discomfort from stage 2 sacral decub ulcer  Diabetes type 2 controlled with complication - 68/12hemoglobin A1c= 4.9 - Continue on sensitive SSI - Glucose trends reviewed. Stable  HLD - LDL not within ADA/AHA guidelines - Lipitor continued at 40 mg daily - LDL goal<70  Viral pneumonia? -Supportive care -Patient received small dose of antibiotics but discontinued. - BAL 6/2 showed Candida albicans, not treated - Weaned to room air  Dysphasia oral pharyngeal - Resolved - Continue on Renal/carb modified  Severe muscle deconditioning - see above. Patient's baseline noted to be functional, playing golf, confirmed by Dr. EMarin Olp Per Oncology, pt has known neuropathy, however is expected to improve with intense physical therapy  Goals of care -6/27 consult to physical medicine for evaluation for CIR placement: Inpatient rehab feels patient not appropriate for CIR. - 6/29 consult social work: Awaiting SNF placement -6/29 SNF was recommended  Stage 2 sacral decub ulcer,  unknown if POA - Patient with open skin tear over sacral region - Recommend frequent turning and mobility - Would continue with analgesia as tolerated  DVT prophylaxis: Lovenox subQ Code  Status: Full Family Communication: pt in room, family not at bedside Disposition Plan: Uncertain at this time  Consultants:  Nephrology Oncology  Procedures:  6/16:Bone Marrow, Aspirate,Biopsy, left ilium - MILDLY HYPERCELLULAR MARROW WITH MEGAKARYOCYTIC HYPOPLASIA AND DYSPLASIA. - he marrow is mildly hypercellular. Megakaryocytes are reduced and dysplastic. There is very mild dysplasia in the erythroid cells. Overall, these changes are suggestive of a low-grade myelodysplastic syndrome, although non-neoplastic causes of dysplasia should be excluded. Correlation with FISH is recommended.  Antimicrobials: Anti-infectives (From admission, onward)   Start     Dose/Rate Route Frequency Ordered Stop   09/15/18 0600  ceFEPIme (MAXIPIME) 2 g in sodium chloride 0.9 % 100 mL IVPB  Status:  Discontinued     2 g 200 mL/hr over 30 Minutes Intravenous Every 24 hours 09/14/18 0816 09/16/18 0957   09/11/18 1200  sulfamethoxazole-trimethoprim (BACTRIM) 560 mg in dextrose 5 % 500 mL IVPB  Status:  Discontinued     560 mg 356.7 mL/hr over 90 Minutes Intravenous Every 8 hours 09/11/18 0948 09/13/18 1309   09/10/18 1800  azithromycin (ZITHROMAX) 500 mg in sodium chloride 0.9 % 250 mL IVPB  Status:  Discontinued     500 mg 250 mL/hr over 60 Minutes Intravenous Every 24 hours 09/10/18 1623 09/16/18 0957   09/09/18 1800  vancomycin (VANCOCIN) 1,250 mg in sodium chloride 0.9 % 250 mL IVPB  Status:  Discontinued     1,250 mg 166.7 mL/hr over 90 Minutes Intravenous Every 24 hours 09/08/18 1704 09/08/18 1744   09/08/18 1800  vancomycin (VANCOCIN) 2,000 mg in sodium chloride 0.9 % 500 mL IVPB  Status:  Discontinued     2,000 mg 250 mL/hr over 120 Minutes Intravenous  Once 09/08/18 1704 09/08/18 1744   09/08/18 1800  ceFEPIme (MAXIPIME) 2 g in sodium chloride 0.9 % 100 mL IVPB  Status:  Discontinued     2 g 200 mL/hr over 30 Minutes Intravenous Every 12 hours 09/08/18 1704 09/14/18 0816   09/08/18 0500   vancomycin (VANCOCIN) IVPB 1000 mg/200 mL premix     1,000 mg 200 mL/hr over 60 Minutes Intravenous  Once 09/08/18 0446 09/08/18 0612   09/08/18 0500  ceFEPIme (MAXIPIME) 2 g in sodium chloride 0.9 % 100 mL IVPB     2 g 200 mL/hr over 30 Minutes Intravenous  Once 09/08/18 0446 09/08/18 0544      Subjective: Patient is motivated to looking forward to start therapy  Objective: Vitals:   10/12/18 0422 10/12/18 0749 10/12/18 1200 10/12/18 1415  BP: 97/68 99/60 (!) 90/57 101/63  Pulse: 89 89 (!) 108 78  Resp: 16 17 (!) 22 16  Temp: 98 F (36.7 C) (!) 97.5 F (36.4 C) 98 F (36.7 C)   TempSrc: Oral Oral Oral   SpO2: 98% 97% 98% 98%  Weight:      Height:        Intake/Output Summary (Last 24 hours) at 10/12/2018 1448 Last data filed at 10/12/2018 0800 Gross per 24 hour  Intake 720 ml  Output 1150 ml  Net -430 ml   Filed Weights   10/10/18 0545 10/11/18 0500 10/12/18 0252  Weight: 104.3 kg 103.6 kg 108.2 kg    Examination: General exam: Awake, laying in bed, in nad Respiratory system: Normal respiratory effort, no wheezing Cardiovascular system: regular  rate, s1, s2 Gastrointestinal system: Soft, nondistended, positive BS Central nervous system: CN2-12 grossly intact, generally weak, no tremors Extremities: Perfused, no clubbing Skin: Normal skin turgor, no notable skin lesions seen Psychiatry: Mood normal // no visual hallucinations   Data Reviewed: I have personally reviewed following labs and imaging studies  CBC: Recent Labs  Lab 10/07/18 0358 10/08/18 0438 10/09/18 0348 10/11/18 0330 10/12/18 0426  WBC 12.2* 11.0* 13.3* 9.5 12.1*  NEUTROABS 10.5* 8.6* 11.1* 6.7 8.7*  HGB 9.1* 8.1* 8.5* 7.7* 7.1*  HCT 27.8* 24.8* 26.3* 23.9* 22.3*  MCV 100.0 100.0 99.2 98.0 100.0  PLT 100* 103* 110* 80* 90*   Basic Metabolic Panel: Recent Labs  Lab 10/06/18 0315 10/07/18 0358 10/08/18 0438 10/09/18 0348 10/10/18 0322 10/11/18 0330 10/12/18 0426  NA 136 135 131*  132* 129* 129* 131*  K 3.4* 3.9 3.5 3.9 3.6 3.7 3.4*  CL 99 99 94* 96* 96* 92* 95*  CO2 _0 GLUCOSE 112* 153* 128* 123* 105* 131* 97  BUN 51* 47* 47* 46* 44* 49* 48*  CREATININE 2.55* 2.49* 2.60* 2.61* 2.73* 2.86* 2.92*  CALCIUM 8.5* 8.6* 8.1* 8.1* 7.7* 7.9* 7.8*  MG 1.6* 1.7 1.6* 1.9  --   --   --   PHOS 3.2 3.1 3.5 3.2 3.2 3.2 4.0   GFR: Estimated Creatinine Clearance: 32.2 mL/min (A) (by C-G formula based on SCr of 2.92 mg/dL (H)). Liver Function Tests: Recent Labs  Lab 10/08/18 0438 10/09/18 0348 10/10/18 0322 10/11/18 0330 10/12/18 0426  ALBUMIN 2.2* 2.0* 1.9* 1.9* 1.6*   No results for input(s): LIPASE, AMYLASE in the last 168 hours. No results for input(s): AMMONIA in the last 168 hours. Coagulation Profile: No results for input(s): INR, PROTIME in the last 168 hours. Cardiac Enzymes: No results for input(s): CKTOTAL, CKMB, CKMBINDEX, TROPONINI in the last 168 hours. BNP (last 3 results) No results for input(s): PROBNP in the last 8760 hours. HbA1C: No results for input(s): HGBA1C in the last 72 hours. CBG: Recent Labs  Lab 10/11/18 1103 10/11/18 1644 10/11/18 2137 10/12/18 0617 10/12/18 1134  GLUCAP 133* 160* 106* 89 127*   Lipid Profile: No results for input(s): CHOL, HDL, LDLCALC, TRIG, CHOLHDL, LDLDIRECT in the last 72 hours. Thyroid Function Tests: No results for input(s): TSH, T4TOTAL, FREET4, T3FREE, THYROIDAB in the last 72 hours. Anemia Panel: No results for input(s): VITAMINB12, FOLATE, FERRITIN, TIBC, IRON, RETICCTPCT in the last 72 hours. Sepsis Labs: No results for input(s): PROCALCITON, LATICACIDVEN in the last 168 hours.  Recent Results (from the past 240 hour(s))  Novel Coronavirus, NAA (hospital order; send-out to ref lab)     Status: None   Collection Time: 10/09/18  8:39 AM   Specimen: Nasopharyngeal Swab; Respiratory  Result Value Ref Range Status   SARS-CoV-2, NAA NOT DETECTED NOT DETECTED Final    Comment: (NOTE)  This test was developed and its performance characteristics determined by [employer]. This test has not been FDA cleared or approved. This test has been authorized by FDA under an Emergency Use Authorization (EUA). This test is only authorized for the duration of time the declaration that circumstances exist justifying the authorization of the emergency use of in vitro diagnostic tests for detection of SARS-CoV-2 virus and/or diagnosis of COVID-19 infection under section 564(b)(1) of the Act, 21 U.S.C. 031RXY-5(O)(5), unless the authorization is terminated or revoked sooner. When diagnostic testing is negative, the possibility of a false negative result should be considered in  the context of a patient's recent exposures and the presence of clinical signs and symptoms consistent with COVID-19. An individual without symptoms of COVID-19 and who is not shedding SARS-CoV-2 virus would expect to have a negative (not detected) result in this assay. Performed  At: [facility] [address] [place], [place] [id number] Rush Farmer MD MC:[id number]    [place]  Final    Comment: Performed at [facility], [place] [address], [place], [place] [address]     Radiology Studies: No results found.  Scheduled Meds: . atorvastatin  40 mg Oral q1800  . chlorhexidine  15 mL Mouth Rinse BID  . diltiazem  60 mg Oral Q6H  . enoxaparin (LOVENOX) injection  40 mg Subcutaneous Q24H  . feeding supplement (ENSURE ENLIVE)  237 mL Oral BID BM  . gabapentin  400 mg Oral TID  . Gerhardt's butt cream   Topical Daily  . insulin aspart  0-9 Units Subcutaneous TID WC  . multivitamin  1 tablet Oral QHS  . pantoprazole  40 mg Oral Daily  . polyethylene glycol  17 g Oral Daily  . vitamin B-6  100 mg Oral Daily  . sorbitol  20 mL Oral Daily   Continuous Infusions:   LOS: 34 days   Marylu Lund, MD [facility] Pager On Amion  If 7PM-7AM, please  contact night-coverage 10/12/2018, 2:48 PM

## 2018-10-12 NOTE — TOC Progression Note (Addendum)
Transition of Care [facility]) - Progression Note    Patient Details  Name: GEROME KOKESH MRN: [id number] Date of Birth: 11-22-1950  Transition of Care [facility]) CM/SW [place], LCSW Phone Number: 10/12/2018, 9:56 AM  Clinical Narrative: Discussed case with MD. He is consulting PT to re-evaluate. He said he had a discussion with patient his morning why insurance denied him and about prior level of function. Patient was independent, playing golf, etc. He said he encouraged patient to work with PT as much as he could to see what he really can do. CSW notified [employer] and will call once note is in. Also called and updated wife.    4:15 pm: Peer-to-peer has been completed. [employer]'s medical director has questions about plan for stage 2 pressure injury wound care. MD will place wound care consult. Authorization review has to be completed by tomorrow morning so CSW has requested to withdraw and will resubmit once wound care note is in.  Expected Discharge Plan: Skilled Nursing Facility Barriers to Discharge: Continued Medical Work up  Expected Discharge Plan and Services Expected Discharge Plan: [place] In-house Referral: Clinical Social Work Discharge Planning Services: NA Post Acute Care Choice: [place] Living arrangements for the past 2 months: Single Family Home Expected Discharge Date: (unknown)               DME Arranged: N/A DME Agency: NA       HH Arranged: NA HH Agency: NA         Social Determinants of Health (SDOH) Interventions    Readmission Risk Interventions Readmission Risk Prevention Plan 09/08/2018 06/28/2018  Transportation Screening Complete Complete  PCP or Specialist Appt within 3-5 Days - Complete  HRI or [place] - Complete  Social Work Consult for [place] Planning/Counseling - Complete  Palliative Care Screening - Not Applicable  Medication Review Press photographer) Complete  Complete  PCP or Specialist appointment within 3-5 days of discharge Not Complete -  PCP/Specialist Appt Not Complete comments not yet ready for d/c -  HRI or [place] Not Complete -  HRI or Home Care Consult Pt Refusal Comments no needs identified at this time -  SW Recovery Care/Counseling Consult Not Complete -  SW Consult Not Complete Comments no needs identified at this time -  Palliative Care Screening Not Applicable -  [place] Not Applicable -  Some recent data might be hidden

## 2018-10-13 LAB — CBC WITH DIFFERENTIAL/PLATELET
Abs Immature Granulocytes: 0.89 10*3/uL — ABNORMAL HIGH (ref 0.00–0.07)
Basophils Absolute: 0 10*3/uL (ref 0.0–0.1)
Basophils Relative: 0 %
Eosinophils Absolute: 0 10*3/uL (ref 0.0–0.5)
Eosinophils Relative: 0 %
HCT: 23 % — ABNORMAL LOW (ref 39.0–52.0)
Hemoglobin: 7.3 g/dL — ABNORMAL LOW (ref 13.0–17.0)
Immature Granulocytes: 6 %
Lymphocytes Relative: 9 %
Lymphs Abs: 1.3 10*3/uL (ref 0.7–4.0)
MCH: 32.3 pg (ref 26.0–34.0)
MCHC: 31.7 g/dL (ref 30.0–36.0)
MCV: 101.8 fL — ABNORMAL HIGH (ref 80.0–100.0)
Monocytes Absolute: 1.8 10*3/uL — ABNORMAL HIGH (ref 0.1–1.0)
Monocytes Relative: 12 %
Neutro Abs: 11.4 10*3/uL — ABNORMAL HIGH (ref 1.7–7.7)
Neutrophils Relative %: 73 %
Platelets: 99 10*3/uL — ABNORMAL LOW (ref 150–400)
RBC: 2.26 MIL/uL — ABNORMAL LOW (ref 4.22–5.81)
RDW: 18.3 % — ABNORMAL HIGH (ref 11.5–15.5)
WBC: 15.4 10*3/uL — ABNORMAL HIGH (ref 4.0–10.5)
nRBC: 0 % (ref 0.0–0.2)

## 2018-10-13 LAB — GLUCOSE, CAPILLARY
Glucose-Capillary: 107 mg/dL — ABNORMAL HIGH (ref 70–99)
Glucose-Capillary: 143 mg/dL — ABNORMAL HIGH (ref 70–99)
Glucose-Capillary: 145 mg/dL — ABNORMAL HIGH (ref 70–99)
Glucose-Capillary: 123 mg/dL — ABNORMAL HIGH (ref 70–99)

## 2018-10-13 LAB — RENAL FUNCTION PANEL
Albumin: 1.6 g/dL — ABNORMAL LOW (ref 3.5–5.0)
Anion gap: 11 (ref 5–15)
BUN: 51 mg/dL — ABNORMAL HIGH (ref 8–23)
CO2: 26 mmol/L (ref 22–32)
Calcium: 7.7 mg/dL — ABNORMAL LOW (ref 8.9–10.3)
Chloride: 95 mmol/L — ABNORMAL LOW (ref 98–111)
Creatinine, Ser: 2.84 mg/dL — ABNORMAL HIGH (ref 0.61–1.24)
GFR calc Af Amer: 25 mL/min — ABNORMAL LOW (ref >60)
GFR calc non Af Amer: 22 mL/min — ABNORMAL LOW (ref >60)
Glucose, Bld: 120 mg/dL — ABNORMAL HIGH (ref 70–99)
Phosphorus: 3.6 mg/dL (ref 2.5–4.6)
Potassium: 3.5 mmol/L (ref 3.5–5.1)
Sodium: 132 mmol/L — ABNORMAL LOW (ref 135–145)

## 2018-10-13 LAB — LACTATE DEHYDROGENASE: LDH: 237 U/L — ABNORMAL HIGH (ref 98–192)

## 2018-10-13 LAB — NOVEL CORONAVIRUS, NAA (HOSP ORDER, SEND-OUT TO REF LAB; TAT 18-24 HRS): SARS-CoV-2, NAA: NOT DETECTED

## 2018-10-13 MED ORDER — POTASSIUM CHLORIDE CRYS ER 20 MEQ PO TBCR
40.0000 meq | EXTENDED_RELEASE_TABLET | Freq: Once | ORAL | Status: AC
  Administered 2018-10-13: 40 meq via ORAL
  Filled 2018-10-13: qty 2

## 2018-10-13 NOTE — Progress Notes (Signed)
Thankfully, [name] Rose will not need to be transfused.  His hemoglobin is 7.3.  I think the issue now is where to place him.  He really, really wants to do physical therapy.  As a point of reference, before he came to the hospital, he was playing golf.  He was exercising.  Even though he had neuropathy, this did not affect his quality of life nor did it affect his activities of daily living.  He is very motivated to do physical therapy.  It would be nice while he is here to have more physical therapy done with him.  He is only been in the hospital for over a month.  He is incredibly deconditioned.  I have to believe this is also a huge issue right now.  I do not believe that his neuropathy will get worse.  He is not going to receive any further therapy for the bladder cancer.  I would like to hope that the neuropathy might improve.  He is back on Neurontin.  He is back on vitamin B6.  He seemed to eat a little bit better.  His labs do not look that bad.  His platelet count is 99,000.  His renal function is stable.  His BUN is 51 and creatinine 2.84.  His chloride is 95.  His sodium is 132.  His vital signs all look stable.  Temperature 97.5.  Pulse 94.  Blood pressure 107/63.  Overall, there is no change in his physical exam.  I am not sure when he will be moved.  It would be nice for him to have inpatient rehab but it does not look like this is going to happen.  I do very much appreciate everybody's help with [name] Rose.  [name], MD  [place] 14:14

## 2018-10-13 NOTE — TOC Progression Note (Addendum)
Transition of Care [facility]) - Progression Note    Patient Details  Name: [name] Rose MRN: [id number] Date of Birth: 03-25-1951  Transition of Care [facility]) CM/SW [place], LCSW Phone Number: 10/13/2018, 9:32 AM  Clinical Narrative: Received call from wife. CSW provided update. WOC and updated therapy notes pending.    10:28 am: Left voicemail for [employer] to let them know WOC note is in.  12:21 pm: Received confirmation from [facility] [employer] that authorization has been restarted.  2:45 pm: Patient has insurance approval to discharge to [facility] once COVID results come back. Wife is aware.  Expected Discharge Plan: Skilled Nursing Facility Barriers to Discharge: Continued Medical Work up  Expected Discharge Plan and Services Expected Discharge Plan: [place] In-house Referral: Clinical Social Work Discharge Planning Services: NA Post Acute Care Choice: [place] Living arrangements for the past 2 months: Single Family Home Expected Discharge Date: (unknown)               DME Arranged: N/A DME Agency: NA       HH Arranged: NA HH Agency: NA         Social Determinants of Health (SDOH) Interventions    Readmission Risk Interventions Readmission Risk Prevention Plan 09/08/2018 06/28/2018  Transportation Screening Complete Complete  PCP or Specialist Appt within 3-5 Days - Complete  HRI or [place] - Complete  Social Work Consult for [place] Planning/Counseling - Complete  Palliative Care Screening - Not Applicable  Medication Review Press photographer) Complete Complete  PCP or Specialist appointment within 3-5 days of discharge Not Complete -  PCP/Specialist Appt Not Complete comments not yet ready for d/c -  HRI or [place] Not Complete -  HRI or Home Care Consult Pt Refusal Comments no needs identified at this time -  SW Recovery Care/Counseling Consult Not Complete -  SW  Consult Not Complete Comments no needs identified at this time -  Palliative Care Screening Not Applicable -  [place] Not Applicable -  Some recent data might be hidden

## 2018-10-13 NOTE — Plan of Care (Signed)
  Problem: Education: Goal: Knowledge of General Education information will improve Description: Including pain rating scale, medication(s)/side effects and non-pharmacologic comfort measures Outcome: Progressing   Problem: Health Behavior/Discharge Planning: Goal: Ability to manage health-related needs will improve Outcome: Progressing   Problem: Clinical Measurements: Goal: Ability to maintain clinical measurements within normal limits will improve Outcome: Progressing Goal: Will remain free from infection Outcome: Progressing Goal: Diagnostic test results will improve Outcome: Progressing Goal: Respiratory complications will improve Outcome: Progressing Goal: Cardiovascular complication will be avoided Outcome: Progressing   Problem: Activity: Goal: Risk for activity intolerance will decrease Outcome: Progressing   Problem: Nutrition: Goal: Adequate nutrition will be maintained Outcome: Progressing   Problem: Coping: Goal: Level of anxiety will decrease Outcome: Progressing   Problem: Elimination: Goal: Will not experience complications related to bowel motility Outcome: Progressing Goal: Will not experience complications related to urinary retention Outcome: Progressing   Problem: Safety: Goal: Ability to remain free from injury will improve Outcome: Progressing   Problem: Skin Integrity: Goal: Risk for impaired skin integrity will decrease Outcome: Progressing   Problem: Activity: Goal: Ability to tolerate increased activity will improve Outcome: Progressing   Problem: Clinical Measurements: Goal: Ability to maintain a body temperature in the normal range will improve Outcome: Progressing   Problem: Respiratory: Goal: Ability to maintain adequate ventilation will improve Outcome: Progressing Goal: Ability to maintain a clear airway will improve Outcome: Progressing

## 2018-10-13 NOTE — Consult Note (Signed)
[place] Nurse wound consult note Reason for Consult:Stage 2 sacral pressure injury.  Buttocks and perineal skin with partial thickness skin loss from moisture associated skin damage   Wound type:pressure and moisture. Pressure Injury POA: No Measurement: 2 cmx 4 cm x0.2 cm  Wound IHW:TUUE and moist Drainage (amount, consistency, odor) minimal serosanguinous   Periwound:MASD Dressing procedure/placement/frequency: No disposable underpads while in bed.  WIll implement low air loss mattress to improve skin microclimate.  Exposure to dermatherapy linen should improve this as well.  Encourage patient to turn and reposition every two hours.  Will implement Gerhardts butt paste.  Will not follow at this time.  Please re-consult if needed.  Domenic Moras MSN, RN, FNP-BC CWON Wound, Ostomy, Continence Nurse Pager [phone]

## 2018-10-13 NOTE — Progress Notes (Signed)
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  Patient presented secondary to significant weakness overnight. He reports no associated symptoms. Weakness occurred acutely as he felt normal the day prior. He reports receiving chemotherapy on a 2-week on 1 week off regimen. Last round of chemotherapy was just over 1-1/2 weeks ago. Patient reports not generally feeling this week with chemotherapy. Weakness has improved slightly since coming to the hospital. 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 Harrie Jeans nephrology does not anticipate patient requiring HD. -RIGHT IJ Vas-Cath removed 6/26   -Strict in and out -18.8 L - 6/27 Vas-Cath removed area covered and clean negative sign of bleeding or infection.   - Nephrology has since signed off as of 6/29 - Cr today of 2.84. Renal function stable  Persistent atrial fibrillation -CHAD2VASC2 = 3 - 6/30 increased Cardizem 60 mg QID -6/30 digoxin 0.125 mg given x 1 dose -Continued on  Metoprolol PRN - Continued on 79m lovenox daily --Patient remains rate controlled  Essential HTN -as per above -BP remains stable  Hypokalemia - Potassium goal> 4 - K of 3.5 this AM. Will replace potassium  Hypomagnesmia - Magnesium goal> 2 -Stable  Metastatic bladder and prostate cancer - Oncology Dr. EMarin Olpmanaging - 6/16 bone marrow biopsy left ilium negative cancer cells see results below - Discussed case with Dr. EMarin Olptoday, who is very well known to patient. Per Dr. EMarin Olp patient's baseline status is fully ambulatory and playing golf. Per Oncology, patient is expected to improve with intensive rehab - Patient has expressed to me strong desire to improve with therapy, limited only by discomfort from stage 2 sacral decub ulcer  Diabetes type 2 controlled with complication - 68/12hemoglobin A1c= 4.9 - Continue on sensitive SSI - Glucose trends reviewed. Stable  HLD - LDL not within ADA/AHA guidelines - Lipitor continued at 40 mg daily - LDL goal<70  Viral pneumonia? -Supportive care -Patient received small dose of antibiotics but discontinued. - BAL 6/2 showed Candida albicans, not treated - Weaned to room air  Dysphasia oral pharyngeal - Resolved - Continue on Renal/carb modified  Severe muscle deconditioning - see above. Patient's baseline noted to be functional, playing golf, confirmed by Dr. EMarin Olp Per Oncology, pt has known neuropathy, however is expected to improve with intense physical therapy -Plan for SNF when COVID test returns neg  Goals of care -6/27 consult to physical medicine for evaluation for CIR placement: Inpatient rehab feels patient not appropriate for CIR. - 6/29 consult social work: Awaiting SNF placement -6/29 SNF was recommended  Stage 2  sacral decub ulcer, unknown if POA - Patient with open skin tear over sacral region - Recommend frequent turning and mobility - Would continue with analgesia as tolerated - Seen  by WOC  DVT prophylaxis: Lovenox subQ Code Status: Full Family Communication: pt in room, family not at bedside Disposition Plan: Uncertain at this time  Consultants:  Nephrology Oncology  Procedures:  6/16:Bone Marrow, Aspirate,Biopsy, left ilium - MILDLY HYPERCELLULAR MARROW WITH MEGAKARYOCYTIC HYPOPLASIA AND DYSPLASIA. - he marrow is mildly hypercellular. Megakaryocytes are reduced and dysplastic. There is very mild dysplasia in the erythroid cells. Overall, these changes are suggestive of a low-grade myelodysplastic syndrome, although non-neoplastic causes of dysplasia should be excluded. Correlation with FISH is recommended.  Antimicrobials: Anti-infectives (From admission, onward)   Start     Dose/Rate Route Frequency Ordered Stop   09/15/18 0600  ceFEPIme (MAXIPIME) 2 g in sodium chloride 0.9 % 100 mL IVPB  Status:  Discontinued     2 g 200 mL/hr over 30 Minutes Intravenous Every 24 hours 09/14/18 0816 09/16/18 0957   09/11/18 1200  sulfamethoxazole-trimethoprim (BACTRIM) 560 mg in dextrose 5 % 500 mL IVPB  Status:  Discontinued     560 mg 356.7 mL/hr over 90 Minutes Intravenous Every 8 hours 09/11/18 0948 09/13/18 1309   09/10/18 1800  azithromycin (ZITHROMAX) 500 mg in sodium chloride 0.9 % 250 mL IVPB  Status:  Discontinued     500 mg 250 mL/hr over 60 Minutes Intravenous Every 24 hours 09/10/18 1623 09/16/18 0957   09/09/18 1800  vancomycin (VANCOCIN) 1,250 mg in sodium chloride 0.9 % 250 mL IVPB  Status:  Discontinued     1,250 mg 166.7 mL/hr over 90 Minutes Intravenous Every 24 hours 09/08/18 1704 09/08/18 1744   09/08/18 1800  vancomycin (VANCOCIN) 2,000 mg in sodium chloride 0.9 % 500 mL IVPB  Status:  Discontinued     2,000 mg 250 mL/hr over 120 Minutes Intravenous  Once 09/08/18 1704 09/08/18 1744   09/08/18 1800  ceFEPIme (MAXIPIME) 2 g in sodium chloride 0.9 % 100 mL IVPB  Status:  Discontinued     2 g 200 mL/hr over 30 Minutes Intravenous Every 12 hours  09/08/18 1704 09/14/18 0816   09/08/18 0500  vancomycin (VANCOCIN) IVPB 1000 mg/200 mL premix     1,000 mg 200 mL/hr over 60 Minutes Intravenous  Once 09/08/18 0446 09/08/18 0612   09/08/18 0500  ceFEPIme (MAXIPIME) 2 g in sodium chloride 0.9 % 100 mL IVPB     2 g 200 mL/hr over 30 Minutes Intravenous  Once 09/08/18 0446 09/08/18 0544      Subjective: Remains motivated to go to therapy at SNF  Objective: Vitals:   10/12/18 1954 10/12/18 2333 10/13/18 0324 10/13/18 0712  BP: 105/65 106/68 107/63 102/74  Pulse:  84 94   Resp: (!) _0 (!) 22  Temp: 98.3 F (36.8 C) 97.9 F (36.6 C) (!) 97.5 F (36.4 C) 97.6 F (36.4 C)  TempSrc: Oral Oral Oral Oral  SpO2: 100% 100% 100%   Weight:   104.7 kg   Height:        Intake/Output Summary (Last 24 hours) at 10/13/2018 1633 Last data filed at 10/13/2018 2122 Gross per 24 hour  Intake -  Output 1151 ml  Net -1151 ml   Filed Weights   10/11/18 0500 10/12/18 0252 10/13/18 0324  Weight: 103.6 kg 108.2 kg 104.7 kg    Examination: General exam: Conversant, in no acute distress Respiratory system: normal  chest rise, clear, no audible wheezing  Data Reviewed: I have personally reviewed following labs and imaging studies  CBC: Recent Labs  Lab 10/08/18 0438 10/09/18 0348 10/11/18 0330 10/12/18 0426 10/13/18 0345  WBC 11.0* 13.3* 9.5 12.1* 15.4*  NEUTROABS 8.6* 11.1* 6.7 8.7* 11.4*  HGB 8.1* 8.5* 7.7* 7.1* 7.3*  HCT 24.8* 26.3* 23.9* 22.3* 23.0*  MCV 100.0 99.2 98.0 100.0 101.8*  PLT 103* 110* 80* 90* 99*   Basic Metabolic Panel: Recent Labs  Lab 10/07/18 0358 10/08/18 0438 10/09/18 0348 10/10/18 0322 10/11/18 0330 10/12/18 0426 10/13/18 0345  NA 135 131* 132* 129* 129* 131* 132*  K 3.9 3.5 3.9 3.6 3.7 3.4* 3.5  CL 99 94* 96* 96* 92* 95* 95*  CO2 _0 GLUCOSE 153* 128* 123* 105* 131* 97 120*  BUN 47* 47* 46* 44* 49* 48* 51*  CREATININE 2.49* 2.60* 2.61* 2.73* 2.86* 2.92* 2.84*  CALCIUM 8.6*  8.1* 8.1* 7.7* 7.9* 7.8* 7.7*  MG 1.7 1.6* 1.9  --   --   --   --   PHOS 3.1 3.5 3.2 3.2 3.2 4.0 3.6   GFR: Estimated Creatinine Clearance: 32.6 mL/min (A) (by C-G formula based on SCr of 2.84 mg/dL (H)). Liver Function Tests: Recent Labs  Lab 10/09/18 0348 10/10/18 0322 10/11/18 0330 10/12/18 0426 10/13/18 0345  ALBUMIN 2.0* 1.9* 1.9* 1.6* 1.6*   No results for input(s): LIPASE, AMYLASE in the last 168 hours. No results for input(s): AMMONIA in the last 168 hours. Coagulation Profile: No results for input(s): INR, PROTIME in the last 168 hours. Cardiac Enzymes: No results for input(s): CKTOTAL, CKMB, CKMBINDEX, TROPONINI in the last 168 hours. BNP (last 3 results) No results for input(s): PROBNP in the last 8760 hours. HbA1C: No results for input(s): HGBA1C in the last 72 hours. CBG: Recent Labs  Lab 10/12/18 1627 10/12/18 2118 10/13/18 0626 10/13/18 1150 10/13/18 1617  GLUCAP 127* 126* 107* 143* 145*   Lipid Profile: No results for input(s): CHOL, HDL, LDLCALC, TRIG, CHOLHDL, LDLDIRECT in the last 72 hours. Thyroid Function Tests: No results for input(s): TSH, T4TOTAL, FREET4, T3FREE, THYROIDAB in the last 72 hours. Anemia Panel: No results for input(s): VITAMINB12, FOLATE, FERRITIN, TIBC, IRON, RETICCTPCT in the last 72 hours. Sepsis Labs: No results for input(s): PROCALCITON, LATICACIDVEN in the last 168 hours.  Recent Results (from the past 240 hour(s))  Novel Coronavirus, NAA (hospital order; send-out to ref lab)     Status: None   Collection Time: 10/09/18  8:39 AM   Specimen: Nasopharyngeal Swab; Respiratory  Result Value Ref Range Status   SARS-CoV-2, NAA NOT DETECTED NOT DETECTED Final    Comment: (NOTE) This test was developed and its performance characteristics determined by [employer]. This test has not been FDA cleared or approved. This test has been authorized by FDA under an Emergency Use Authorization (EUA). This test is only  authorized for the duration of time the declaration that circumstances exist justifying the authorization of the emergency use of in vitro diagnostic tests for detection of SARS-CoV-2 virus and/or diagnosis of COVID-19 infection under section 564(b)(1) of the Act, 21 U.S.C. 546EVO-3(J)(0), unless the authorization is terminated or revoked sooner. When diagnostic testing is negative, the possibility of a false negative result should be considered in the context of a patient's recent exposures and the presence of clinical signs and symptoms consistent with COVID-19. An individual without symptoms of COVID-19 and who is not shedding SARS-CoV-2 virus would  expect to have a negative (not detected) result in this assay. Performed  At: [facility] [address] [place], [place] [id number] Rush Farmer MD EH:[id number]    [place]  Final    Comment: Performed at [facility], [place] [address], [place],  [address]     Radiology Studies: No results found.  Scheduled Meds: . atorvastatin  40 mg Oral q1800  . chlorhexidine  15 mL Mouth Rinse BID  . diltiazem  60 mg Oral Q6H  . enoxaparin (LOVENOX) injection  40 mg Subcutaneous Q24H  . feeding supplement (ENSURE ENLIVE)  237 mL Oral BID BM  . gabapentin  400 mg Oral TID  . Gerhardt's butt cream   Topical Daily  . insulin aspart  0-9 Units Subcutaneous TID WC  . multivitamin  1 tablet Oral QHS  . pantoprazole  40 mg Oral Daily  . polyethylene glycol  17 g Oral Daily  . vitamin B-6  100 mg Oral Daily  . sorbitol  20 mL Oral Daily   Continuous Infusions:   LOS: 35 days   Marylu Lund, MD [facility] Pager On Amion  If 7PM-7AM, please contact night-coverage 10/13/2018, 4:33 PM

## 2018-10-13 NOTE — Progress Notes (Signed)
Physical Therapy Treatment Patient Details Name: [name] Rose MRN: [id number] DOB: [birth year] Today's Date: 10/13/2018    History of Present Illness Pt is a 68 y/o male presenting with respiratory failure and fever secondary to presumed pneumonia. SARS virus test negative x 2 Intubated 6/2 thru 6/10, CRRT while in ICU.  Course complicated by anemia and atrial fib with RVR. PMH: prostate cancer with bladder mets, CKD, HTN, hyperglycemia.     PT Comments    Pt admitted with above diagnosis. Pt currently with functional limitations due to balance and endurance deficits. Pt was able to stand to [place] plus with max assist.  Stood for 15 seconds, then 25 seconds, then 20 seconds with max assist of 2 and Saraplus assist.  Will follow acutely.   Pt will benefit from skilled PT to increase their independence and safety with mobility to allow discharge to the venue listed below.     Follow Up Recommendations  SNF     Equipment Recommendations  (TBA)    Recommendations for Other Services       Precautions / Restrictions Precautions Precautions: Fall Restrictions Weight Bearing Restrictions: No    Mobility  Bed Mobility Overal bed mobility: Needs Assistance Bed Mobility: Rolling Rolling: Mod assist;+2 for physical assistance Sidelying to sit: +2 for physical assistance;HOB elevated;Mod assist Supine to sit: HOB elevated;+2 for physical assistance;Mod assist     General bed mobility comments: modA of 2 for rolling and side to sit.  Used pad to assist.  Needed assist for LEs and trunk elevatrion.   Transfers Overall transfer level: Needs assistance   Transfers: Sit to/from Stand Sit to Stand: Max assist;+2 physical assistance;From elevated surface(used pad to assist with power up)         General transfer comment: Pt needed max assist with [name] plus and use of pad to power up from bed.  Pt stood 15 seconds, then 25 seconds then 20 seconds.  See OT note for pt brushing teeth at end  of session.   Ambulation/Gait             General Gait Details: unable   Stairs             Wheelchair Mobility    Modified Rankin (Stroke Patients Only)       Balance Overall balance assessment: Needs assistance Sitting-balance support: Feet supported;Single extremity supported Sitting balance-Leahy Scale: Fair Sitting balance - Comments: min guard for sitting on edge of bed Postural control: Posterior lean Standing balance support: During functional activity;Bilateral upper extremity supported Standing balance-Leahy Scale: Zero Standing balance comment: Stood with [name] plus.  Limited by left shoulder pain and bil Knee weakness.                             Cognition Arousal/Alertness: Awake/alert Behavior During Therapy: WFL for tasks assessed/performed Overall Cognitive Status: Within Functional Limits for tasks assessed(NT formally) Area of Impairment: Problem solving                             Problem Solving: Slow processing;Decreased initiation;Requires verbal cues;Difficulty sequencing        Exercises General Exercises - Lower Extremity Ankle Circles/Pumps: Both;10 reps;Supine;AAROM;AROM Quad Sets: AROM;Both;Supine;5 reps Other Exercises Other Exercises: Standing knee extension in [place] plus    General Comments General comments (skin integrity, edema, etc.): HR to 134 bpm with standing. Takes 3 min of rest in  between standing.       Pertinent Vitals/Pain Pain Assessment: Faces Faces Pain Scale: Hurts whole lot Pain Location: buttock and left shoulder Pain Descriptors / Indicators: Grimacing;Guarding;Sharp Pain Intervention(s): Limited activity within patient's tolerance;Monitored during session;Repositioned    Home Living                      Prior Function            PT Goals (current goals can now be found in the care plan section) Acute Rehab PT Goals Patient Stated Goal: to get stronger Progress  towards PT goals: Progressing toward goals    Frequency    Min 3X/week      PT Plan Current plan remains appropriate    Co-evaluation PT/OT/SLP Co-Evaluation/Treatment: Yes Reason for Co-Treatment: Complexity of the patient's impairments (multi-system involvement);For patient/therapist safety          AM-PAC PT "6 Clicks" Mobility   Outcome Measure  Help needed turning from your back to your side while in a flat bed without using bedrails?: A Lot Help needed moving from lying on your back to sitting on the side of a flat bed without using bedrails?: A Lot Help needed moving to and from a bed to a chair (including a wheelchair)?: A Lot Help needed standing up from a chair using your arms (e.g., wheelchair or bedside chair)?: A Lot Help needed to walk in hospital room?: Total Help needed climbing 3-5 steps with a railing? : Total 6 Click Score: 10    End of Session Equipment Utilized During Treatment: Oxygen;Gait belt Activity Tolerance: Patient limited by fatigue Patient left: with call bell/phone within reach;in chair;with chair alarm set Nurse Communication: Mobility status;Need for lift equipment PT Visit Diagnosis: Unsteadiness on feet (R26.81);Other abnormalities of gait and mobility (R26.89);Muscle weakness (generalized) (M62.81)     Time: 5521-7471 PT Time Calculation (min) (ACUTE ONLY): 31 min  Charges:  $Therapeutic Activity: 8-22 mins                     [facility] Pager:  [phone]  Office:  [place] 10/13/2018, 2:01 PM

## 2018-10-13 NOTE — Progress Notes (Signed)
Occupational Therapy Treatment Patient Details Name: [name] Rose MRN: [id number] DOB: [birth year] Today's Date: 10/13/2018    History of present illness Pt is a 68 y/o male presenting with respiratory failure and fever secondary to presumed pneumonia. SARS virus test negative x 2 Intubated 6/2 thru 6/10, CRRT while in ICU.  Course complicated by anemia and atrial fib with RVR. PMH: prostate cancer with bladder mets, CKD, HTN, hyperglycemia.    OT comments  Pt readily willing to work with therapies. Focus of session on bed mobility, sit to stand x 3 with sara plus and seated grooming. Pt limited by L shoulder pain he reports is a result of laying on it at night and buttocks pain. Pt with HR to 134 with exertion, but quickly returns to low 100s with rest breaks.   Follow Up Recommendations  SNF;Supervision/Assistance - 24 hour    Equipment Recommendations  3 in 1 bedside commode    Recommendations for Other Services      Precautions / Restrictions Precautions Precautions: Fall Precaution Comments: watch HR Restrictions Weight Bearing Restrictions: No       Mobility Bed Mobility Overal bed mobility: Needs Assistance Bed Mobility: Rolling;Sidelying to Sit Rolling: Mod assist;+2 for physical assistance Sidelying to sit: +2 for physical assistance;Mod assist      General bed mobility comments: pt unable to use L UE on rail, used bed pad to assist with rolling, assisted with LEs over EOB and to raise trunk  Transfers Overall transfer level: Needs assistance   Transfers: Sit to/from Stand Sit to Stand: Max assist;+2 physical assistance;From elevated surface         General transfer comment: Pt needed max assist with [name] plus and use of pad to power up from bed.  Pt stood 15 seconds, then 25 seconds then 20 seconds.      Balance Overall balance assessment: Needs assistance Sitting-balance support: Feet supported;Single extremity supported Sitting balance-Leahy Scale:  Fair Sitting balance - Comments: min guard for sitting on edge of bed  Standing balance support: During functional activity;Bilateral upper extremity supported Standing balance-Leahy Scale: Zero Standing balance comment: Stood with [name] plus.  Limited by left shoulder pain and bil Knee weakness.                            ADL either performed or assessed with clinical judgement   ADL Overall ADL's : Needs assistance/impaired     Grooming: Wash/dry face;Oral care;Sitting;Set up Grooming Details (indicate cue type and reason): in chair             Lower Body Dressing: Total assistance;Bed level                 General ADL Comments: pt remains highly motivated, willing to try whatever is asked of him     Vision       Perception     Praxis      Cognition Arousal/Alertness: Awake/alert Behavior During Therapy: Flat affect Overall Cognitive Status: Impaired/Different from baseline Area of Impairment: Problem solving                             Problem Solving: Slow processing;Decreased initiation;Requires verbal cues;Difficulty sequencing          Exercises    Shoulder Instructions       General Comments HR to 134 bpm with standing. Takes 3 min of rest in between  standing.     Pertinent Vitals/ Pain       Pain Assessment: Faces Faces Pain Scale: Hurts whole lot Pain Location: buttock and left shoulder Pain Descriptors / Indicators: Grimacing;Guarding;Sharp Pain Intervention(s): Monitored during session;Repositioned  Home Living                                          Prior Functioning/Environment              Frequency  Min 2X/week        Progress Toward Goals  OT Goals(current goals can now be found in the care plan section)  Progress towards OT goals: Progressing toward goals  Acute Rehab OT Goals Patient Stated Goal: to get stronger OT Goal Formulation: With patient Time For Goal  Achievement: 10/27/18 Potential to Achieve Goals: Good  Plan Discharge plan remains appropriate    Co-evaluation    PT/OT/SLP Co-Evaluation/Treatment: Yes Reason for Co-Treatment: Complexity of the patient's impairments (multi-system involvement)   OT goals addressed during session: ADL's and self-care      AM-PAC OT "6 Clicks" Daily Activity     Outcome Measure   Help from another person eating meals?: None Help from another person taking care of personal grooming?: A Little Help from another person toileting, which includes using toliet, bedpan, or urinal?: Total Help from another person bathing (including washing, rinsing, drying)?: A Lot Help from another person to put on and taking off regular upper body clothing?: A Little Help from another person to put on and taking off regular lower body clothing?: Total 6 Click Score: 14    End of Session Equipment Utilized During Treatment: Oxygen;Gait belt(2L)  OT Visit Diagnosis: Other abnormalities of gait and mobility (R26.89);Muscle weakness (generalized) (M62.81)   Activity Tolerance Patient limited by pain;Patient limited by fatigue   Patient Left in chair;with call bell/phone within reach   Nurse Communication Need for lift equipment(maximove back to bed)        Time: 1305-1340 OT Time Calculation (min): 35 min  Charges: OT General Charges $OT Visit: 1 Visit OT Treatments $Self Care/Home Management : 8-22 mins  Nestor Lewandowsky, OTR/L Acute Rehabilitation Services Pager: [phone] Office: [phone]   Malka So 10/13/2018, 2:29 PM

## 2018-10-14 DIAGNOSIS — M21379 Foot drop, unspecified foot: Secondary | ICD-10-CM | POA: Diagnosis not present

## 2018-10-14 DIAGNOSIS — R9431 Abnormal electrocardiogram [ECG] [EKG]: Secondary | ICD-10-CM | POA: Diagnosis not present

## 2018-10-14 DIAGNOSIS — L98419 Non-pressure chronic ulcer of buttock with unspecified severity: Secondary | ICD-10-CM | POA: Diagnosis not present

## 2018-10-14 DIAGNOSIS — M2550 Pain in unspecified joint: Secondary | ICD-10-CM | POA: Diagnosis not present

## 2018-10-14 DIAGNOSIS — M19011 Primary osteoarthritis, right shoulder: Secondary | ICD-10-CM | POA: Diagnosis not present

## 2018-10-14 DIAGNOSIS — I4811 Longstanding persistent atrial fibrillation: Secondary | ICD-10-CM | POA: Diagnosis not present

## 2018-10-14 DIAGNOSIS — N171 Acute kidney failure with acute cortical necrosis: Secondary | ICD-10-CM | POA: Diagnosis not present

## 2018-10-14 DIAGNOSIS — M629 Disorder of muscle, unspecified: Secondary | ICD-10-CM | POA: Diagnosis not present

## 2018-10-14 DIAGNOSIS — R233 Spontaneous ecchymoses: Secondary | ICD-10-CM | POA: Diagnosis not present

## 2018-10-14 DIAGNOSIS — A499 Bacterial infection, unspecified: Secondary | ICD-10-CM | POA: Diagnosis not present

## 2018-10-14 DIAGNOSIS — K219 Gastro-esophageal reflux disease without esophagitis: Secondary | ICD-10-CM | POA: Diagnosis not present

## 2018-10-14 DIAGNOSIS — L89620 Pressure ulcer of left heel, unstageable: Secondary | ICD-10-CM | POA: Diagnosis not present

## 2018-10-14 DIAGNOSIS — M6281 Muscle weakness (generalized): Secondary | ICD-10-CM | POA: Diagnosis not present

## 2018-10-14 DIAGNOSIS — L89152 Pressure ulcer of sacral region, stage 2: Secondary | ICD-10-CM | POA: Diagnosis not present

## 2018-10-14 DIAGNOSIS — R0989 Other specified symptoms and signs involving the circulatory and respiratory systems: Secondary | ICD-10-CM | POA: Diagnosis not present

## 2018-10-14 DIAGNOSIS — Z7401 Bed confinement status: Secondary | ICD-10-CM | POA: Diagnosis not present

## 2018-10-14 DIAGNOSIS — R5381 Other malaise: Secondary | ICD-10-CM | POA: Diagnosis not present

## 2018-10-14 DIAGNOSIS — E8770 Fluid overload, unspecified: Secondary | ICD-10-CM | POA: Diagnosis not present

## 2018-10-14 DIAGNOSIS — J9602 Acute respiratory failure with hypercapnia: Secondary | ICD-10-CM | POA: Diagnosis not present

## 2018-10-14 DIAGNOSIS — I10 Essential (primary) hypertension: Secondary | ICD-10-CM | POA: Diagnosis not present

## 2018-10-14 DIAGNOSIS — E785 Hyperlipidemia, unspecified: Secondary | ICD-10-CM | POA: Diagnosis not present

## 2018-10-14 DIAGNOSIS — C679 Malignant neoplasm of bladder, unspecified: Secondary | ICD-10-CM | POA: Diagnosis not present

## 2018-10-14 DIAGNOSIS — N170 Acute kidney failure with tubular necrosis: Secondary | ICD-10-CM | POA: Diagnosis not present

## 2018-10-14 DIAGNOSIS — E119 Type 2 diabetes mellitus without complications: Secondary | ICD-10-CM | POA: Diagnosis not present

## 2018-10-14 DIAGNOSIS — G8929 Other chronic pain: Secondary | ICD-10-CM | POA: Diagnosis not present

## 2018-10-14 DIAGNOSIS — I480 Paroxysmal atrial fibrillation: Secondary | ICD-10-CM | POA: Diagnosis not present

## 2018-10-14 DIAGNOSIS — N179 Acute kidney failure, unspecified: Secondary | ICD-10-CM | POA: Diagnosis not present

## 2018-10-14 DIAGNOSIS — M5418 Radiculopathy, sacral and sacrococcygeal region: Secondary | ICD-10-CM | POA: Diagnosis not present

## 2018-10-14 DIAGNOSIS — C61 Malignant neoplasm of prostate: Secondary | ICD-10-CM | POA: Diagnosis not present

## 2018-10-14 DIAGNOSIS — M25511 Pain in right shoulder: Secondary | ICD-10-CM | POA: Diagnosis not present

## 2018-10-14 DIAGNOSIS — J9601 Acute respiratory failure with hypoxia: Secondary | ICD-10-CM | POA: Diagnosis not present

## 2018-10-14 DIAGNOSIS — N19 Unspecified kidney failure: Secondary | ICD-10-CM | POA: Diagnosis not present

## 2018-10-14 DIAGNOSIS — R600 Localized edema: Secondary | ICD-10-CM | POA: Diagnosis not present

## 2018-10-14 DIAGNOSIS — R262 Difficulty in walking, not elsewhere classified: Secondary | ICD-10-CM | POA: Diagnosis not present

## 2018-10-14 DIAGNOSIS — L89153 Pressure ulcer of sacral region, stage 3: Secondary | ICD-10-CM | POA: Diagnosis not present

## 2018-10-14 DIAGNOSIS — J189 Pneumonia, unspecified organism: Secondary | ICD-10-CM | POA: Diagnosis not present

## 2018-10-14 DIAGNOSIS — N183 Chronic kidney disease, stage 3 (moderate): Secondary | ICD-10-CM | POA: Diagnosis not present

## 2018-10-14 DIAGNOSIS — I129 Hypertensive chronic kidney disease with stage 1 through stage 4 chronic kidney disease, or unspecified chronic kidney disease: Secondary | ICD-10-CM | POA: Diagnosis not present

## 2018-10-14 DIAGNOSIS — Z8701 Personal history of pneumonia (recurrent): Secondary | ICD-10-CM | POA: Diagnosis not present

## 2018-10-14 DIAGNOSIS — I482 Chronic atrial fibrillation, unspecified: Secondary | ICD-10-CM | POA: Diagnosis not present

## 2018-10-14 DIAGNOSIS — E559 Vitamin D deficiency, unspecified: Secondary | ICD-10-CM | POA: Diagnosis not present

## 2018-10-14 DIAGNOSIS — K5900 Constipation, unspecified: Secondary | ICD-10-CM | POA: Diagnosis not present

## 2018-10-14 DIAGNOSIS — E876 Hypokalemia: Secondary | ICD-10-CM | POA: Diagnosis not present

## 2018-10-14 LAB — RENAL FUNCTION PANEL
Albumin: 1.7 g/dL — ABNORMAL LOW (ref 3.5–5.0)
Anion gap: 10 (ref 5–15)
BUN: 50 mg/dL — ABNORMAL HIGH (ref 8–23)
CO2: 27 mmol/L (ref 22–32)
Calcium: 7.8 mg/dL — ABNORMAL LOW (ref 8.9–10.3)
Chloride: 97 mmol/L — ABNORMAL LOW (ref 98–111)
Creatinine, Ser: 2.59 mg/dL — ABNORMAL HIGH (ref 0.61–1.24)
GFR calc Af Amer: 28 mL/min — ABNORMAL LOW (ref >60)
GFR calc non Af Amer: 25 mL/min — ABNORMAL LOW (ref >60)
Glucose, Bld: 114 mg/dL — ABNORMAL HIGH (ref 70–99)
Phosphorus: 3.2 mg/dL (ref 2.5–4.6)
Potassium: 3.7 mmol/L (ref 3.5–5.1)
Sodium: 134 mmol/L — ABNORMAL LOW (ref 135–145)

## 2018-10-14 LAB — CBC WITH DIFFERENTIAL/PLATELET
Abs Immature Granulocytes: 1.19 10*3/uL — ABNORMAL HIGH (ref 0.00–0.07)
Basophils Absolute: 0 10*3/uL (ref 0.0–0.1)
Basophils Relative: 0 %
Eosinophils Absolute: 0 10*3/uL (ref 0.0–0.5)
Eosinophils Relative: 0 %
HCT: 23 % — ABNORMAL LOW (ref 39.0–52.0)
Hemoglobin: 7.1 g/dL — ABNORMAL LOW (ref 13.0–17.0)
Immature Granulocytes: 8 %
Lymphocytes Relative: 10 %
Lymphs Abs: 1.5 10*3/uL (ref 0.7–4.0)
MCH: 31.6 pg (ref 26.0–34.0)
MCHC: 30.9 g/dL (ref 30.0–36.0)
MCV: 102.2 fL — ABNORMAL HIGH (ref 80.0–100.0)
Monocytes Absolute: 1.6 10*3/uL — ABNORMAL HIGH (ref 0.1–1.0)
Monocytes Relative: 10 %
Neutro Abs: 11.3 10*3/uL — ABNORMAL HIGH (ref 1.7–7.7)
Neutrophils Relative %: 72 %
Platelets: 115 10*3/uL — ABNORMAL LOW (ref 150–400)
RBC: 2.25 MIL/uL — ABNORMAL LOW (ref 4.22–5.81)
RDW: 18.2 % — ABNORMAL HIGH (ref 11.5–15.5)
WBC: 15.6 10*3/uL — ABNORMAL HIGH (ref 4.0–10.5)
nRBC: 0.1 % (ref 0.0–0.2)

## 2018-10-14 LAB — GLUCOSE, CAPILLARY
Glucose-Capillary: 114 mg/dL — ABNORMAL HIGH (ref 70–99)
Glucose-Capillary: 141 mg/dL — ABNORMAL HIGH (ref 70–99)

## 2018-10-14 LAB — LACTATE DEHYDROGENASE: LDH: 236 U/L — ABNORMAL HIGH (ref 98–192)

## 2018-10-14 MED ORDER — GERHARDT'S BUTT CREAM: 1.0000 "application " | TOPICAL_CREAM | Freq: Two times a day (BID) | CUTANEOUS | 0 refills | Status: DC

## 2018-10-14 MED ORDER — SORBITOL 70 % SOLN: 20.0000 mL | Freq: Every day | 0 refills | Status: AC

## 2018-10-14 MED ORDER — PANTOPRAZOLE SODIUM 40 MG PO TBEC: 40.0000 mg | DELAYED_RELEASE_TABLET | Freq: Every day | ORAL | 0 refills | Status: DC

## 2018-10-14 MED ORDER — INSULIN ASPART 100 UNIT/ML ~~LOC~~ SOLN: SUBCUTANEOUS | 0 refills | Status: DC

## 2018-10-14 MED ORDER — DILTIAZEM 12 MG/ML ORAL SUSPENSION: 60.0000 mg | Freq: Four times a day (QID) | ORAL | 0 refills | Status: DC

## 2018-10-14 MED ORDER — HEPARIN SOD (PORK) LOCK FLUSH 100 UNIT/ML IV SOLN
500.0000 [IU] | INTRAVENOUS | Status: AC | PRN
  Administered 2018-10-14: 500 [IU]

## 2018-10-14 MED ORDER — ATORVASTATIN CALCIUM 40 MG PO TABS: 40.0000 mg | ORAL_TABLET | Freq: Every day | ORAL | 0 refills | Status: DC

## 2018-10-14 NOTE — Progress Notes (Addendum)
Pt is discharged to [facility]. IV port a cath de-accessed , tele dc,report provided to the receiving RN at [facility], pt left hospital via [facility] with all of his belongings and discharge summary paper, report is also given to [facility]. Vitals stable and pt left in a stable condition  [place]ma Holter, Therapist, sports

## 2018-10-14 NOTE — Discharge Summary (Addendum)
Physician Discharge Summary  BENNO BRENSINGER HYW:[id number] DOB: 07/14/1950 DOA: 09/08/2018  PCP: Shirline Frees, MD  Admit date: 09/08/2018 Discharge date: 10/14/2018  Admitted From: Home Disposition:  SNF  Recommendations for Outpatient Follow-up:  1. Follow up with PCP in 1-2 weeks 2. Please obtain BMP, report results to facility MD 3. Follow up with Cardiology as scheduled 4. Follow up with Oncology as scheduled  COVID test is negative  Wound recommendations for stage 2 sacral decub ulcer: Dressing procedure/placement/frequency: No disposable underpads while in bed.  WIll implement low air loss mattress to improve skin microclimate.  Exposure to dermatherapy linen should improve this as well.  Encourage patient to turn and reposition every two hours.  Will implement Gerhardts butt paste.  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  Patient presented secondary to significant weakness overnight. He reports no associated symptoms. Weakness occurred acutely as he felt normal the day prior. He reports receiving chemotherapy on a 2-week on 1 week off regimen. Last round of chemotherapy was just over 1-1/2 weeks ago. Patient reports not generally feeling this week with chemotherapy. Weakness has improved slightly since coming to the hospital. 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 Cecille Rubin [facility] does not anticipate patient requiring HD. -RIGHT IJ Vas-Cath removed 6/26  -Strict in and out -6/27 Vas-Cath removed area covered and clean negative sign of bleeding or infection.  - Nephrology has since signed off as of 6/29 - Cr stable. Recommend repeat bmet in one week  Persistent atrial fibrillation -CHAD2VASC2 = 3 -6/30 increased Cardizem 60 mgQID -6/30 digoxin 0.125 mg given x 1 dose --Patient remains rate controlled -Per Cardiology, no anticoagulation due to low platelets  Essential HTN -as per above -BP remains stable  Hypokalemia -Potassium goal>4  Hypomagnesmia -Magnesium goal>2 - Stable  Metastatic bladder and prostate cancer -Oncology Dr. Rosalene Billings -6/16 bone marrow biopsy left ilium negative cancer cells see results below -Discussed case with [name] today, who is very well known to patient. Per [name], patient's baseline status is fully ambulatory and playing golf. Per Oncology, patient is expected to improve with intensive rehab - Patient has expressed to me strong desire to improve with therapy, limited only by discomfort from stage 2 sacral decub ulcer  Diabetes type 2 controlled with complication -4/85 hemoglobin A1c= 4.9 -Continue on sensitive SSI - Glucose trends reviewed. Stable  HLD -LDL not within ADA/AHA guidelines -Lipitor continued at 40 mg daily -LDL goal<70  Viral pneumonia? -Supportive care -Patient received small dose of antibiotics but discontinued. -BAL 6/2 showed Candida albicans, not treated - Weaned to room air  Dysphasia oral  pharyngeal -Resolved -Continue on Renal/carb modified  Severe muscle deconditioning -see above. Patient's baseline noted to be functional, playing golf, confirmed by [name]. Per Oncology, pt has known neuropathy, however is expected to improve with intense physical therapy -Plan for SNF today  Goals of care -6/27 consult to physical medicine for evaluation for CIR placement: Inpatient rehab feels patient not appropriate for CIR. -6/29 consult social work: Awaiting SNF placement -6/29 SNF was recommended  Stage 2 sacral decub ulcer, unknown if POA - Patient with open skin tear over sacral region - Recommend frequent turning and mobility - Would continue with analgesia as tolerated - Seen by [facility]  Discharge Instructions   Allergies as of 10/14/2018   No Active Allergies     Medication List    STOP taking these medications   Cinnamon 500 MG Tabs   dexamethasone 4 MG tablet Commonly known as: DECADRON   diltiazem 240 MG 24 hr capsule Commonly known as: CARDIZEM CD   lidocaine-prilocaine cream Commonly known as: EMLA   LORazepam 0.5 MG tablet Commonly known as: ATIVAN   losartan 100 MG tablet Commonly known as: COZAAR   metoprolol tartrate 25 MG tablet Commonly known as: LOPRESSOR     TAKE these medications   atorvastatin 40 MG tablet Commonly known as: LIPITOR Take 1 tablet (40 mg total) by mouth daily at 6 PM.   diltiazem 10 mg/ml  oral suspension Commonly known as: CARDIZEM Take 6 mLs (60 mg total) by mouth every 6 (six) hours.   gabapentin 400 MG capsule Commonly known as: NEURONTIN TAKE 1 CAPSULE (400 MG TOTAL) BY MOUTH 4 (FOUR) TIMES DAILY.   Gerhardt's butt cream Crea Apply 1 application topically 2 (two) times daily.   insulin aspart 100 UNIT/ML injection Commonly known as: NovoLOG Sliding scale dose:  CBG < 70: Implement Hypoglycemia Protocol  CBG 70 - 120: 0 units  CBG 121 - 150: 1 unit  CBG 151 - 200: 2 units  CBG 201 - 250: 3  units  CBG 251 - 300: 5 units  CBG 301 - 350: 7 units  CBG 351 - 400 9 units  CBG > 400 Call facility MD   MAGNESIUM PO Take 400 mg by mouth at bedtime. Leg cramps   ondansetron 8 MG tablet Commonly known as: Zofran Take 1 tablet (8 mg total) by mouth 2 (two) times daily as needed for refractory nausea / vomiting. Start on day 3 after chemo.   pantoprazole 40 MG tablet Commonly known as: PROTONIX Take 1 tablet (40 mg total) by mouth daily. Start taking on: October 15, 2018   prochlorperazine 10 MG tablet Commonly known as: COMPAZINE Take 1 tablet (10 mg total) by mouth every 6 (six) hours as needed (Nausea or vomiting).   senna 8.6 MG Tabs tablet Commonly known as: SENOKOT Take 1 tablet (8.6 mg total) by mouth 2 (two) times daily.   sorbitol 70 % Soln Take 20 mLs by mouth daily. Start taking on: October 15, 2018   vitamin B-6 250 MG tablet Take 1 tablet (250 mg total) by mouth daily.       Contact information for follow-up providers    Pixie Casino, MD Follow up on 11/09/2018.   Specialty: Cardiology Why: Please arrive 15 minutes early for your 8:00am in-office visit with [name] on 11/09/2018. Contact information: [place] [address] [phone]            Contact information for after-discharge care    Destination    [facility] Preferred SNF .   Service: Skilled Nursing Contact information: [address] [place] [phone]                 No Active Allergies  Consultations: Nephrology Oncology Cardiology  Procedures/Studies: Dg Abd 1 View  Result Date: 09/22/2018 CLINICAL DATA:  Nasogastric placement. EXAM: ABDOMEN - 1 VIEW COMPARISON:  09/12/2018 FINDINGS: Feeding tube enters the stomach, loops and has its tip in the fundus. Gas pattern within normal limits. IMPRESSION: Feeding tube enters the stomach, loops and has its tip in the fundus of the stomach. Electronically  Signed   By: Nelson Chimes M.D.   On: 09/22/2018 11:03   US Renal  Result Date: 09/18/2018 CLINICAL DATA:  Acute renal failure. EXAM: RENAL / URINARY TRACT ULTRASOUND COMPLETE COMPARISON:  PET CT scan 09/05/2018. FINDINGS: Right Kidney: Renal measurements: 10.7 x 5.9 x 5.4 cm = volume: 163.7 mL . Echogenicity within normal limits. No mass or hydronephrosis visualized. Echogenic liver consistent with fatty infiltration noted. There is a small amount of perihepatic fluid Left Kidney: Renal measurements: 12.6 x 5.7 x 5.2 cm = volume: 196.9 mL. Echogenicity within normal limits. No mass or hydronephrosis visualized. Bladder: Decompressed with a Foley catheter in place. IMPRESSION: Negative for hydronephrosis.  No acute abnormality. Fatty infiltration a of the liver and small volume perihepatic ascites. Electronically Signed   By: Inge Rise M.D.   On: 09/18/2018 13:47   Dg Chest Port 1 View  Result Date: 09/24/2018 CLINICAL DATA:  Pt sob, hx hypoxia, pneumonia, pt has 3 lines and an ng tube, dialysis Rt ij, rt port and left ij ? getting blood mk rt-r EXAM: PORTABLE CHEST 1 VIEW COMPARISON:  09/20/2018 FINDINGS: Port in the anterior chest wall with tip in distal SVC. LEFT and RIGHT central venous lines with tips in the mid SVC. Feeding tube with tip in the stomach. Normal cardiac silhouette. Bilateral fine airspace disease.  No pneumothorax IMPRESSION: 1. Stable support apparatus. 2. Mild pulmonary edema pattern. Electronically Signed   By: Suzy Bouchard M.D.   On: 09/24/2018 13:34   Dg Chest Port 1 View  Result Date: 09/20/2018 CLINICAL DATA:  Acute respiratory failure with hypoxia, history of hypertension, bladder cancer, prostate cancer EXAM: PORTABLE CHEST 1 VIEW COMPARISON:  Portable exam 0433 hours compared to 09/19/2018 FINDINGS: Tip of endotracheal tube projects 5.8 cm above carina. Nasogastric tube extends into stomach. BILATERAL jugular lines with tips projecting over SVC. RIGHT jugular  Port-A-Cath with tip projecting over SVC. Enlargement of cardiac silhouette with slight vascular congestion. Mediastinal contour stable. Hazy infiltrates of the mid to lower lungs unchanged. No pleural effusion or pneumothorax. IMPRESSION: Persistent hazy infiltrates of the mid to lower lungs, unchanged. Electronically Signed   By: Lavonia Dana M.D.   On: 09/20/2018 08:42   Dg Chest Port 1 View  Result Date: 09/19/2018 CLINICAL DATA:  Hemodialysis catheter dysfunction. EXAM: PORTABLE CHEST 1 VIEW COMPARISON:  Radiograph of same day. FINDINGS: Stable cardiomegaly. Endotracheal and nasogastric tubes are unchanged in position. Left internal jugular catheter is unchanged. Two right internal jugular catheters are unchanged in position. This includes right internal jugular dialysis catheter, with distal tip in expected position of the SVC. No pneumothorax is noted. No significant pleural effusion is noted. Stable bibasilar atelectasis, infiltrates or edema are noted. Bony thorax is unremarkable. IMPRESSION: Stable support apparatus, including right internal jugular dialysis catheter, the distal tip of which is seen in expected position of the SVC. Stable  bibasilar opacities as described above. Electronically Signed   By: Marijo Conception M.D.   On: 09/19/2018 17:47   Dg Chest Port 1 View  Result Date: 09/19/2018 CLINICAL DATA:  Respiratory failure. EXAM: PORTABLE CHEST 1 VIEW COMPARISON:  Single-view of the chest 09/18/2018 and 09/17/2018. FINDINGS: Support tubes and lines are unchanged. Lung volumes are slightly lower than on the most recent examination. Diffuse bilateral airspace opacities appear worse. No pneumothorax. Heart size is enlarged. IMPRESSION: No change in support apparatus. Diffuse bilateral airspace disease appears slightly worse which could be due to increased edema or pneumonia. Lung volumes are slightly lower and decreased aeration could be due in part to atelectasis. Electronically Signed   By:  Inge Rise M.D.   On: 09/19/2018 08:24   Dg Chest Port 1 View  Result Date: 09/18/2018 CLINICAL DATA:  Central line placement EXAM: PORTABLE CHEST 1 VIEW COMPARISON:  09/18/2018 FINDINGS: Right Port-A-Cath and left central line remain in place, unchanged. Endotracheal tube and NG tube are also unchanged. Interval placement of right Vas-Cath with the tip in the SVC. No pneumothorax. Cardiomegaly with vascular congestion. Interstitial and airspace opacities have slightly improved since prior study. IMPRESSION: Interval placement of right Vas-Cath with the tip in the SVC. No pneumothorax. Remainder the support devices are stable. Mild cardiomegaly, vascular congestion. Interstitial and airspace opacities slightly improved. Electronically Signed   By: Rolm Baptise M.D.   On: 09/18/2018 11:44   Dg Chest Port 1 View  Result Date: 09/18/2018 CLINICAL DATA:  Respiratory failure. EXAM: PORTABLE CHEST 1 VIEW COMPARISON:  One-view chest x-ray 09/17/2018 FINDINGS: The heart is enlarged. Endotracheal tube terminates 4 cm above the carina, in satisfactory position. Right IJ Port-A-Cath is stable. A left IJ line is stable. Increasing interstitial and airspace disease is noted both lung bases. Small effusions are suspected. Upper lung fields remain clear. IMPRESSION: 1. Increasing interstitial and airspace disease at both lung bases pattern below with ARDS or edema. Infection is not excluded. 2. Suspect bilateral pleural effusions. 3. Support apparatus is stable and in satisfactory position. Electronically Signed   By: San Morelle M.D.   On: 09/18/2018 07:50   Dg Chest Port 1 View  Result Date: 09/17/2018 CLINICAL DATA:  Respiratory failure EXAM: PORTABLE CHEST 1 VIEW COMPARISON:  Chest radiograph 09/15/2018 FINDINGS: ET tube mid trachea. Enteric tube courses inferior to the diaphragm. Right-sided Port-A-Cath is present with tip projecting over the superior vena cava. Stable cardiomegaly. Low lung volumes.  Similar bilateral mid lower lung heterogeneous opacities. Left IJ central venous catheter tip projects over the superior vena cava. IMPRESSION: Similar mid lower lung heterogeneous opacities with low lung volumes. Electronically Signed   By: Lovey Newcomer M.D.   On: 09/17/2018 08:33   Dg Chest Port 1 View  Result Date: 09/15/2018 CLINICAL DATA:  Respiratory failure. EXAM: PORTABLE CHEST 1 VIEW COMPARISON:  Radiograph September 14, 2018. FINDINGS: Stable cardiomediastinal silhouette. Endotracheal and nasogastric tubes are unchanged in position. Right internal jugular Port-A-Cath is unchanged in position. Left internal jugular catheter is unchanged. No pneumothorax is noted. Stable bibasilar edema, atelectasis or infiltrates are noted. Bony thorax is unremarkable. IMPRESSION: Stable support apparatus. Stable bibasilar opacities as described above. Electronically Signed   By: Marijo Conception M.D.   On: 09/15/2018 07:47   Ct Bone Marrow Biopsy & Aspiration  Result Date: 09/26/2018 INDICATION: 68 year old with history of bladder cancer, renal failure and thrombocytopenia. EXAM: CT GUIDED BONE MARROW ASPIRATES AND BIOPSY Physician: Stephan Minister. Anselm Pancoast, MD MEDICATIONS:  None. ANESTHESIA/SEDATION: Fentanyl 50 mcg IV; Versed 1.0 mg IV Moderate Sedation Time:  12 minutes The patient was continuously monitored during the procedure by the interventional radiology nurse under my direct supervision. COMPLICATIONS: None immediate. PROCEDURE: The procedure was explained to the patient. The risks and benefits of the procedure were discussed and the patient's questions were addressed. Informed consent was obtained from the patient. The patient was placed on the CT table with left side down. Images of the pelvis were obtained. The back was prepped and draped in sterile fashion. The skin and left posterior ilium were anesthetized with 1% lidocaine. 11 gauge bone needle was directed into the left ilium with CT guidance. Two aspirates and one  core biopsy were obtained. Bandage placed over the puncture site. IMPRESSION: CT guided bone marrow aspiration and core biopsy. Electronically Signed   By: Markus Daft M.D.   On: 09/26/2018 11:44     Subjective: Eager to start therapy  Discharge Exam: Vitals:   10/14/18 0359 10/14/18 0746  BP: 104/69 108/70  Pulse: 95 79  Resp: 16 (!) 24  Temp: 97.6 F (36.4 C) 98 F (36.7 C)  SpO2: 100% 96%   Vitals:   10/13/18 2348 10/14/18 0359 10/14/18 0500 10/14/18 0746  BP: 107/69 104/69  108/70  Pulse: 94 95  79  Resp: (!) 21 16  (!) 24  Temp: 98 F (36.7 C) 97.6 F (36.4 C)  98 F (36.7 C)  TempSrc: Oral Oral  Oral  SpO2: 100% 100%  96%  Weight:   108.9 kg   Height:        General: Pt is alert, awake, not in acute distress Cardiovascular: RRR, S1/S2 +, no rubs, no gallops Respiratory: CTA bilaterally, no wheezing, no rhonchi Abdominal: Soft, NT, ND, bowel sounds + Extremities: no edema, no cyanosis   The results of significant diagnostics from this hospitalization (including imaging, microbiology, ancillary and laboratory) are listed below for reference.     Microbiology: Recent Results (from the past 240 hour(s))  Novel Coronavirus, NAA (hospital order; send-out to ref lab)     Status: None   Collection Time: 10/09/18  8:39 AM   Specimen: Nasopharyngeal Swab; Respiratory  Result Value Ref Range Status   SARS-CoV-2, NAA NOT DETECTED NOT DETECTED Final    Comment: (NOTE) This test was developed and its performance characteristics determined by [employer]. This test has not been FDA cleared or approved. This test has been authorized by FDA under an Emergency Use Authorization (EUA). This test is only authorized for the duration of time the declaration that circumstances exist justifying the authorization of the emergency use of in vitro diagnostic tests for detection of SARS-CoV-2 virus and/or diagnosis of COVID-19 infection under section 564(b)(1) of the Act,  21 U.S.C. 500BBC-4(U)(8), unless the authorization is terminated or revoked sooner. When diagnostic testing is negative, the possibility of a false negative result should be considered in the context of a patient's recent exposures and the presence of clinical signs and symptoms consistent with COVID-19. An individual without symptoms of COVID-19 and who is not shedding SARS-CoV-2 virus would expect to have a negative (not detected) result in this assay. Performed  At: [facility] [address] [place], [place] [id number] Rush Farmer MD UU:[id number]    [place]  Final    Comment: Performed at [facility], [place] [address] [place], [place] [address]  Novel Coronavirus, NAA (hospital order; send-out to ref lab)     Status:  None   Collection Time: 10/12/18  5:05 PM   Specimen: Nasopharyngeal Swab; Respiratory  Result Value Ref Range Status   SARS-CoV-2, NAA NOT DETECTED NOT DETECTED Final    Comment: (NOTE) This test was developed and its performance characteristics determined by [employer]. This test has not been FDA cleared or approved. This test has been authorized by FDA under an Emergency Use Authorization (EUA). This test is only authorized for the duration of time the declaration that circumstances exist justifying the authorization of the emergency use of in vitro diagnostic tests for detection of SARS-CoV-2 virus and/or diagnosis of COVID-19 infection under section 564(b)(1) of the Act, 21 U.S.C. 124PYK-9(X)(8), unless the authorization is terminated or revoked sooner. When diagnostic testing is negative, the possibility of a false negative result should be considered in the context of a patient's recent exposures and the presence of clinical signs and symptoms consistent with COVID-19. An individual without symptoms of COVID-19 and who is not shedding SARS-CoV-2 virus would expect to have a negative (not detected) result  in this assay. Performed  At: [facility] [address] [place], [place] [id number] Rush Farmer MD JQ:[id number]    [place]  Final    Comment: Performed at [facility], [place] [address] [place], [place] [address]     Labs: BNP (last 3 results) No results for input(s): BNP in the last 8760 hours. Basic Metabolic Panel: Recent Labs  Lab 10/08/18 0438 10/09/18 0348 10/10/18 0322 10/11/18 0330 10/12/18 0426 10/13/18 0345 10/14/18 0510  NA 131* 132* 129* 129* 131* 132* 134*  K 3.5 3.9 3.6 3.7 3.4* 3.5 3.7  CL 94* 96* 96* 92* 95* 95* 97*  CO2 _0 GLUCOSE 128* 123* 105* 131* 97 120* 114*  BUN 47* 46* 44* 49* 48* 51* 50*  CREATININE 2.60* 2.61* 2.73* 2.86* 2.92* 2.84* 2.59*  CALCIUM 8.1* 8.1* 7.7* 7.9* 7.8* 7.7* 7.8*  MG 1.6* 1.9  --   --   --   --   --   PHOS 3.5 3.2 3.2 3.2 4.0 3.6 3.2   Liver Function Tests: Recent Labs  Lab 10/10/18 0322 10/11/18 0330 10/12/18 0426 10/13/18 0345 10/14/18 0510  ALBUMIN 1.9* 1.9* 1.6* 1.6* 1.7*   No results for input(s): LIPASE, AMYLASE in the last 168 hours. No results for input(s): AMMONIA in the last 168 hours. CBC: Recent Labs  Lab 10/09/18 0348 10/11/18 0330 10/12/18 0426 10/13/18 0345 10/14/18 0510  WBC 13.3* 9.5 12.1* 15.4* 15.6*  NEUTROABS 11.1* 6.7 8.7* 11.4* 11.3*  HGB 8.5* 7.7* 7.1* 7.3* 7.1*  HCT 26.3* 23.9* 22.3* 23.0* 23.0*  MCV 99.2 98.0 100.0 101.8* 102.2*  PLT 110* 80* 90* 99* 115*   Cardiac Enzymes: No results for input(s): CKTOTAL, CKMB, CKMBINDEX, TROPONINI in the last 168 hours. BNP: Invalid input(s): POCBNP CBG: Recent Labs  Lab 10/13/18 0626 10/13/18 1150 10/13/18 1617 10/13/18 2115 10/14/18 0611  GLUCAP 107* 143* 145* 123* 114*   D-Dimer No results for input(s): DDIMER in the last 72 hours. Hgb A1c No results for input(s): HGBA1C in the last 72 hours. Lipid Profile No results for input(s): CHOL, HDL, LDLCALC, TRIG, CHOLHDL,  LDLDIRECT in the last 72 hours. Thyroid function studies No results for input(s): TSH, T4TOTAL, T3FREE, THYROIDAB in the last 72 hours.  Invalid input(s): FREET3 Anemia work up No results for input(s): VITAMINB12, FOLATE, FERRITIN, TIBC, IRON, RETICCTPCT in the last 72 hours. Urinalysis    Component Value  Date/Time   COLORURINE YELLOW 09/18/2018 1302   APPEARANCEUR CLOUDY (A) 09/18/2018 1302   LABSPEC 1.011 09/18/2018 1302   PHURINE 5.0 09/18/2018 1302   GLUCOSEU >=500 (A) 09/18/2018 1302   HGBUR MODERATE (A) 09/18/2018 1302   BILIRUBINUR NEGATIVE 09/18/2018 1302   KETONESUR NEGATIVE 09/18/2018 1302   PROTEINUR NEGATIVE 09/18/2018 1302   UROBILINOGEN 0.2 01/31/2010 0624   NITRITE NEGATIVE 09/18/2018 1302   LEUKOCYTESUR NEGATIVE 09/18/2018 1302   Sepsis Labs Invalid input(s): PROCALCITONIN,  WBC,  LACTICIDVEN Microbiology Recent Results (from the past 240 hour(s))  Novel Coronavirus, NAA (hospital order; send-out to ref lab)     Status: None   Collection Time: 10/09/18  8:39 AM   Specimen: Nasopharyngeal Swab; Respiratory  Result Value Ref Range Status   SARS-CoV-2, NAA NOT DETECTED NOT DETECTED Final    Comment: (NOTE) This test was developed and its performance characteristics determined by [employer]. This test has not been FDA cleared or approved. This test has been authorized by FDA under an Emergency Use Authorization (EUA). This test is only authorized for the duration of time the declaration that circumstances exist justifying the authorization of the emergency use of in vitro diagnostic tests for detection of SARS-CoV-2 virus and/or diagnosis of COVID-19 infection under section 564(b)(1) of the Act, 21 U.S.C. 299BZJ-6(R)(6), unless the authorization is terminated or revoked sooner. When diagnostic testing is negative, the possibility of a false negative result should be considered in the context of a patient's recent exposures and the presence of clinical  signs and symptoms consistent with COVID-19. An individual without symptoms of COVID-19 and who is not shedding SARS-CoV-2 virus would expect to have a negative (not detected) result in this assay. Performed  At: [facility] [address] [place], [place] [id number] Rush Farmer MD PZ:[id number]    [place]  Final    Comment: Performed at [facility], [place] [address], [place], [place] [address]  Novel Coronavirus, NAA (hospital order; send-out to ref lab)     Status: None   Collection Time: 10/12/18  5:05 PM   Specimen: Nasopharyngeal Swab; Respiratory  Result Value Ref Range Status   SARS-CoV-2, NAA NOT DETECTED NOT DETECTED Final    Comment: (NOTE) This test was developed and its performance characteristics determined by [employer]. This test has not been FDA cleared or approved. This test has been authorized by FDA under an Emergency Use Authorization (EUA). This test is only authorized for the duration of time the declaration that circumstances exist justifying the authorization of the emergency use of in vitro diagnostic tests for detection of SARS-CoV-2 virus and/or diagnosis of COVID-19 infection under section 564(b)(1) of the Act, 21 U.S.C. 614ERX-5(Q)(0), unless the authorization is terminated or revoked sooner. When diagnostic testing is negative, the possibility of a false negative result should be considered in the context of a patient's recent exposures and the presence of clinical signs and symptoms consistent with COVID-19. An individual without symptoms of COVID-19 and who is not shedding SARS-CoV-2 virus would expect to have a negative (not detected) result in this assay. Performed  At: [facility] [address] [place], [place] [id number] Rush Farmer MD DT:[id number]    [place]  Final    Comment: Performed at [facility], [place] [address], [place],  [address]    Time spent: 30 min  SIGNED:   Marylu Lund, MD  [facility] 10/14/2018, 8:58 AM  If 7PM-7AM, please contact night-coverage

## 2018-10-14 NOTE — TOC Transition Note (Signed)
Transition of Care [facility]) - CM/SW Discharge Note   Patient Details  Name: [name] Rose MRN: [id number] Date of Birth: [birth year]  Transition of Care [facility]) CM/SW Contact:  Alberteen Sam, LCSW Phone Number: 10/14/2018, 9:30 AM   Clinical Narrative:     Patient will DC to: [place] date: 10/14/2018 Family notified: Threasa Beards Transport by: Corey Harold  Per MD patient ready for DC to [facility]. RN, patient, patient's family, and facility notified of DC. Discharge Summary sent to facility. RN given number for report [phone] . DC packet on chart. Ambulance transport requested for patient.  CSW signing off.  [place], [place]   Final next level of care: Skilled Nursing Facility Barriers to Discharge: No Barriers Identified   Patient Goals and CMS Choice Patient states their goals for this hospitalization and ongoing recovery are:: Pt is agreeable to going to rehab CMS Medicare.gov Compare Post Acute Care list provided to:: Patient Represenative (must comment)(wife Melanie) Choice offered to / list presented to : Spouse(Melanie)  Discharge Placement PASRR number recieved: 10/08/18            Patient chooses bed at: [facility] Patient to be transferred to facility by: [place] Name of family member notified: Threasa Beards (spouse) Patient and family notified of of transfer: 10/14/18  Discharge Plan and Services In-house Referral: Clinical Social Work Discharge Planning Services: NA Post Acute Care Choice: [place]          DME Arranged: N/A DME Agency: NA       HH Arranged: NA [place] Agency: NA        Social Determinants of Health ([place]) Interventions     Readmission Risk Interventions Readmission Risk Prevention Plan 09/08/2018 06/28/2018  Transportation Screening Complete Complete  PCP or Specialist Appt within 3-5 Days - Complete  HRI or [place] - Complete  Social Work Consult for [place] Planning/Counseling - Complete  Palliative Care Screening - Not Applicable  Medication Review Press photographer) Complete Complete  PCP or Specialist appointment within 3-5 days of discharge Not Complete -  PCP/Specialist Appt Not Complete comments not yet ready for d/c -  HRI or [place] Not Complete -  [place] or Home Care Consult Pt Refusal Comments no needs identified at this time -  SW Recovery Care/Counseling Consult Not Complete -  SW Consult Not Complete Comments no needs identified at this time -  Palliative Care Screening Not Applicable -  [place] Not Applicable -  Some recent data might be hidden

## 2018-10-17 DIAGNOSIS — C679 Malignant neoplasm of bladder, unspecified: Secondary | ICD-10-CM | POA: Diagnosis not present

## 2018-10-17 DIAGNOSIS — I10 Essential (primary) hypertension: Secondary | ICD-10-CM | POA: Diagnosis not present

## 2018-10-17 DIAGNOSIS — N170 Acute kidney failure with tubular necrosis: Secondary | ICD-10-CM | POA: Diagnosis not present

## 2018-10-17 DIAGNOSIS — I480 Paroxysmal atrial fibrillation: Secondary | ICD-10-CM | POA: Diagnosis not present

## 2018-10-19 DIAGNOSIS — L89152 Pressure ulcer of sacral region, stage 2: Secondary | ICD-10-CM | POA: Diagnosis not present

## 2018-10-24 DIAGNOSIS — M21379 Foot drop, unspecified foot: Secondary | ICD-10-CM | POA: Diagnosis not present

## 2018-10-24 DIAGNOSIS — N183 Chronic kidney disease, stage 3 (moderate): Secondary | ICD-10-CM | POA: Diagnosis not present

## 2018-10-24 DIAGNOSIS — M6281 Muscle weakness (generalized): Secondary | ICD-10-CM | POA: Diagnosis not present

## 2018-10-24 DIAGNOSIS — L89152 Pressure ulcer of sacral region, stage 2: Secondary | ICD-10-CM | POA: Diagnosis not present

## 2018-10-24 DIAGNOSIS — R262 Difficulty in walking, not elsewhere classified: Secondary | ICD-10-CM | POA: Diagnosis not present

## 2018-10-24 DIAGNOSIS — N179 Acute kidney failure, unspecified: Secondary | ICD-10-CM | POA: Diagnosis not present

## 2018-10-24 DIAGNOSIS — M25511 Pain in right shoulder: Secondary | ICD-10-CM | POA: Diagnosis not present

## 2018-10-25 DIAGNOSIS — M25511 Pain in right shoulder: Secondary | ICD-10-CM | POA: Diagnosis not present

## 2018-10-25 DIAGNOSIS — M6281 Muscle weakness (generalized): Secondary | ICD-10-CM | POA: Diagnosis not present

## 2018-10-25 DIAGNOSIS — C679 Malignant neoplasm of bladder, unspecified: Secondary | ICD-10-CM | POA: Diagnosis not present

## 2018-10-25 DIAGNOSIS — L89152 Pressure ulcer of sacral region, stage 2: Secondary | ICD-10-CM | POA: Diagnosis not present

## 2018-10-26 DIAGNOSIS — M25511 Pain in right shoulder: Secondary | ICD-10-CM | POA: Diagnosis not present

## 2018-10-26 DIAGNOSIS — L89152 Pressure ulcer of sacral region, stage 2: Secondary | ICD-10-CM | POA: Diagnosis not present

## 2018-10-26 DIAGNOSIS — R262 Difficulty in walking, not elsewhere classified: Secondary | ICD-10-CM | POA: Diagnosis not present

## 2018-10-26 DIAGNOSIS — M6281 Muscle weakness (generalized): Secondary | ICD-10-CM | POA: Diagnosis not present

## 2018-10-26 DIAGNOSIS — M21379 Foot drop, unspecified foot: Secondary | ICD-10-CM | POA: Diagnosis not present

## 2018-10-31 DIAGNOSIS — R233 Spontaneous ecchymoses: Secondary | ICD-10-CM | POA: Diagnosis not present

## 2018-10-31 DIAGNOSIS — M6281 Muscle weakness (generalized): Secondary | ICD-10-CM | POA: Diagnosis not present

## 2018-10-31 DIAGNOSIS — R5381 Other malaise: Secondary | ICD-10-CM | POA: Diagnosis not present

## 2018-10-31 DIAGNOSIS — N179 Acute kidney failure, unspecified: Secondary | ICD-10-CM | POA: Diagnosis not present

## 2018-11-01 DIAGNOSIS — N179 Acute kidney failure, unspecified: Secondary | ICD-10-CM | POA: Diagnosis not present

## 2018-11-01 DIAGNOSIS — R5381 Other malaise: Secondary | ICD-10-CM | POA: Diagnosis not present

## 2018-11-01 DIAGNOSIS — M6281 Muscle weakness (generalized): Secondary | ICD-10-CM | POA: Diagnosis not present

## 2018-11-01 DIAGNOSIS — N183 Chronic kidney disease, stage 3 (moderate): Secondary | ICD-10-CM | POA: Diagnosis not present

## 2018-11-02 DIAGNOSIS — K5900 Constipation, unspecified: Secondary | ICD-10-CM | POA: Diagnosis not present

## 2018-11-02 DIAGNOSIS — G8929 Other chronic pain: Secondary | ICD-10-CM | POA: Diagnosis not present

## 2018-11-02 DIAGNOSIS — M629 Disorder of muscle, unspecified: Secondary | ICD-10-CM | POA: Diagnosis not present

## 2018-11-02 DIAGNOSIS — L89620 Pressure ulcer of left heel, unstageable: Secondary | ICD-10-CM | POA: Diagnosis not present

## 2018-11-02 DIAGNOSIS — M5418 Radiculopathy, sacral and sacrococcygeal region: Secondary | ICD-10-CM | POA: Diagnosis not present

## 2018-11-02 DIAGNOSIS — L89153 Pressure ulcer of sacral region, stage 3: Secondary | ICD-10-CM | POA: Diagnosis not present

## 2018-11-03 DIAGNOSIS — M25511 Pain in right shoulder: Secondary | ICD-10-CM | POA: Diagnosis not present

## 2018-11-03 DIAGNOSIS — M21379 Foot drop, unspecified foot: Secondary | ICD-10-CM | POA: Diagnosis not present

## 2018-11-03 DIAGNOSIS — R262 Difficulty in walking, not elsewhere classified: Secondary | ICD-10-CM | POA: Diagnosis not present

## 2018-11-03 DIAGNOSIS — M6281 Muscle weakness (generalized): Secondary | ICD-10-CM | POA: Diagnosis not present

## 2018-11-03 LAB — ACID FAST CULTURE WITH REFLEXED SENSITIVITIES (MYCOBACTERIA): Acid Fast Culture: NEGATIVE

## 2018-11-06 ENCOUNTER — Telehealth: Payer: Self-pay | Admitting: *Deleted

## 2018-11-06 NOTE — Telephone Encounter (Signed)
Call placed to patient's wife to check on patients status.  She states that patient is still currently admitted at [facility] and is having PT seven days a week.  Pt.'s wife appreciative of call.  [name] notified of pt.'s status.

## 2018-11-09 ENCOUNTER — Telehealth: Payer: Self-pay | Admitting: *Deleted

## 2018-11-09 ENCOUNTER — Ambulatory Visit: Payer: PPO | Admitting: Internal Medicine

## 2018-11-09 DIAGNOSIS — L98419 Non-pressure chronic ulcer of buttock with unspecified severity: Secondary | ICD-10-CM | POA: Diagnosis not present

## 2018-11-09 DIAGNOSIS — L89153 Pressure ulcer of sacral region, stage 3: Secondary | ICD-10-CM | POA: Diagnosis not present

## 2018-11-09 NOTE — Telephone Encounter (Signed)
Call received from [place] with [facility] wanting to know when pt.'s next appt is with [name].  Call placed back to Tammy and notified her per order of [name] that patient can make appt to see [name] once he is discharged from [facility].  Tammy appreciative of call back and has no further questions at this time.

## 2018-11-16 DIAGNOSIS — I4811 Longstanding persistent atrial fibrillation: Secondary | ICD-10-CM | POA: Diagnosis not present

## 2018-11-16 DIAGNOSIS — L89153 Pressure ulcer of sacral region, stage 3: Secondary | ICD-10-CM | POA: Diagnosis not present

## 2018-11-16 DIAGNOSIS — M6281 Muscle weakness (generalized): Secondary | ICD-10-CM | POA: Diagnosis not present

## 2018-11-16 DIAGNOSIS — Z8701 Personal history of pneumonia (recurrent): Secondary | ICD-10-CM | POA: Diagnosis not present

## 2018-11-16 DIAGNOSIS — R5381 Other malaise: Secondary | ICD-10-CM | POA: Diagnosis not present

## 2018-11-17 DIAGNOSIS — M6281 Muscle weakness (generalized): Secondary | ICD-10-CM | POA: Diagnosis not present

## 2018-11-17 DIAGNOSIS — E559 Vitamin D deficiency, unspecified: Secondary | ICD-10-CM | POA: Diagnosis not present

## 2018-11-17 DIAGNOSIS — I480 Paroxysmal atrial fibrillation: Secondary | ICD-10-CM | POA: Diagnosis not present

## 2018-11-20 DIAGNOSIS — M6281 Muscle weakness (generalized): Secondary | ICD-10-CM | POA: Diagnosis not present

## 2018-11-20 DIAGNOSIS — R5381 Other malaise: Secondary | ICD-10-CM | POA: Diagnosis not present

## 2018-11-20 DIAGNOSIS — N183 Chronic kidney disease, stage 3 (moderate): Secondary | ICD-10-CM | POA: Diagnosis not present

## 2018-11-20 DIAGNOSIS — I480 Paroxysmal atrial fibrillation: Secondary | ICD-10-CM | POA: Diagnosis not present

## 2018-11-22 DIAGNOSIS — N183 Chronic kidney disease, stage 3 (moderate): Secondary | ICD-10-CM | POA: Diagnosis not present

## 2018-11-22 DIAGNOSIS — I480 Paroxysmal atrial fibrillation: Secondary | ICD-10-CM | POA: Diagnosis not present

## 2018-11-22 DIAGNOSIS — C679 Malignant neoplasm of bladder, unspecified: Secondary | ICD-10-CM | POA: Diagnosis not present

## 2018-11-22 DIAGNOSIS — E8770 Fluid overload, unspecified: Secondary | ICD-10-CM | POA: Diagnosis not present

## 2018-11-22 DIAGNOSIS — E876 Hypokalemia: Secondary | ICD-10-CM | POA: Diagnosis not present

## 2018-11-22 DIAGNOSIS — E559 Vitamin D deficiency, unspecified: Secondary | ICD-10-CM | POA: Diagnosis not present

## 2018-11-22 DIAGNOSIS — I129 Hypertensive chronic kidney disease with stage 1 through stage 4 chronic kidney disease, or unspecified chronic kidney disease: Secondary | ICD-10-CM | POA: Diagnosis not present

## 2018-11-23 DIAGNOSIS — L89620 Pressure ulcer of left heel, unstageable: Secondary | ICD-10-CM | POA: Diagnosis not present

## 2018-11-23 DIAGNOSIS — I480 Paroxysmal atrial fibrillation: Secondary | ICD-10-CM | POA: Diagnosis not present

## 2018-11-23 DIAGNOSIS — R5381 Other malaise: Secondary | ICD-10-CM | POA: Diagnosis not present

## 2018-11-23 DIAGNOSIS — N183 Chronic kidney disease, stage 3 (moderate): Secondary | ICD-10-CM | POA: Diagnosis not present

## 2018-11-23 DIAGNOSIS — N179 Acute kidney failure, unspecified: Secondary | ICD-10-CM | POA: Diagnosis not present

## 2018-11-27 ENCOUNTER — Other Ambulatory Visit: Payer: Self-pay | Admitting: Internal Medicine

## 2018-11-27 DIAGNOSIS — M5418 Radiculopathy, sacral and sacrococcygeal region: Secondary | ICD-10-CM

## 2018-11-27 DIAGNOSIS — N183 Chronic kidney disease, stage 3 (moderate): Secondary | ICD-10-CM | POA: Diagnosis not present

## 2018-11-27 DIAGNOSIS — R600 Localized edema: Secondary | ICD-10-CM | POA: Diagnosis not present

## 2018-11-27 DIAGNOSIS — M6281 Muscle weakness (generalized): Secondary | ICD-10-CM | POA: Diagnosis not present

## 2018-11-27 DIAGNOSIS — N179 Acute kidney failure, unspecified: Secondary | ICD-10-CM | POA: Diagnosis not present

## 2018-11-27 DIAGNOSIS — M5416 Radiculopathy, lumbar region: Secondary | ICD-10-CM

## 2018-11-30 DIAGNOSIS — R5381 Other malaise: Secondary | ICD-10-CM | POA: Diagnosis not present

## 2018-11-30 DIAGNOSIS — L89620 Pressure ulcer of left heel, unstageable: Secondary | ICD-10-CM | POA: Diagnosis not present

## 2018-11-30 DIAGNOSIS — R600 Localized edema: Secondary | ICD-10-CM | POA: Diagnosis not present

## 2018-11-30 DIAGNOSIS — A499 Bacterial infection, unspecified: Secondary | ICD-10-CM | POA: Diagnosis not present

## 2018-12-07 DIAGNOSIS — L89620 Pressure ulcer of left heel, unstageable: Secondary | ICD-10-CM | POA: Diagnosis not present

## 2018-12-11 DIAGNOSIS — L89152 Pressure ulcer of sacral region, stage 2: Secondary | ICD-10-CM | POA: Diagnosis not present

## 2018-12-11 DIAGNOSIS — I4811 Longstanding persistent atrial fibrillation: Secondary | ICD-10-CM | POA: Diagnosis not present

## 2018-12-11 DIAGNOSIS — E119 Type 2 diabetes mellitus without complications: Secondary | ICD-10-CM | POA: Diagnosis not present

## 2018-12-11 DIAGNOSIS — I10 Essential (primary) hypertension: Secondary | ICD-10-CM | POA: Diagnosis not present

## 2018-12-13 DIAGNOSIS — D696 Thrombocytopenia, unspecified: Secondary | ICD-10-CM | POA: Diagnosis not present

## 2018-12-13 DIAGNOSIS — N183 Chronic kidney disease, stage 3 (moderate): Secondary | ICD-10-CM | POA: Diagnosis not present

## 2018-12-13 DIAGNOSIS — I501 Left ventricular failure: Secondary | ICD-10-CM | POA: Diagnosis not present

## 2018-12-13 DIAGNOSIS — C7801 Secondary malignant neoplasm of right lung: Secondary | ICD-10-CM | POA: Diagnosis not present

## 2018-12-13 DIAGNOSIS — C61 Malignant neoplasm of prostate: Secondary | ICD-10-CM | POA: Diagnosis not present

## 2018-12-13 DIAGNOSIS — J9601 Acute respiratory failure with hypoxia: Secondary | ICD-10-CM | POA: Diagnosis not present

## 2018-12-13 DIAGNOSIS — N170 Acute kidney failure with tubular necrosis: Secondary | ICD-10-CM | POA: Diagnosis not present

## 2018-12-13 DIAGNOSIS — E559 Vitamin D deficiency, unspecified: Secondary | ICD-10-CM | POA: Diagnosis not present

## 2018-12-13 DIAGNOSIS — Z87891 Personal history of nicotine dependence: Secondary | ICD-10-CM | POA: Diagnosis not present

## 2018-12-13 DIAGNOSIS — E876 Hypokalemia: Secondary | ICD-10-CM | POA: Diagnosis not present

## 2018-12-13 DIAGNOSIS — E785 Hyperlipidemia, unspecified: Secondary | ICD-10-CM | POA: Diagnosis not present

## 2018-12-13 DIAGNOSIS — Z9181 History of falling: Secondary | ICD-10-CM | POA: Diagnosis not present

## 2018-12-13 DIAGNOSIS — Z8701 Personal history of pneumonia (recurrent): Secondary | ICD-10-CM | POA: Diagnosis not present

## 2018-12-13 DIAGNOSIS — C779 Secondary and unspecified malignant neoplasm of lymph node, unspecified: Secondary | ICD-10-CM | POA: Diagnosis not present

## 2018-12-13 DIAGNOSIS — I480 Paroxysmal atrial fibrillation: Secondary | ICD-10-CM | POA: Diagnosis not present

## 2018-12-13 DIAGNOSIS — E1122 Type 2 diabetes mellitus with diabetic chronic kidney disease: Secondary | ICD-10-CM | POA: Diagnosis not present

## 2018-12-13 DIAGNOSIS — J9811 Atelectasis: Secondary | ICD-10-CM | POA: Diagnosis not present

## 2018-12-13 DIAGNOSIS — C7802 Secondary malignant neoplasm of left lung: Secondary | ICD-10-CM | POA: Diagnosis not present

## 2018-12-13 DIAGNOSIS — I130 Hypertensive heart and chronic kidney disease with heart failure and stage 1 through stage 4 chronic kidney disease, or unspecified chronic kidney disease: Secondary | ICD-10-CM | POA: Diagnosis not present

## 2018-12-13 DIAGNOSIS — K760 Fatty (change of) liver, not elsewhere classified: Secondary | ICD-10-CM | POA: Diagnosis not present

## 2018-12-13 DIAGNOSIS — C7911 Secondary malignant neoplasm of bladder: Secondary | ICD-10-CM | POA: Diagnosis not present

## 2018-12-25 ENCOUNTER — Other Ambulatory Visit: Payer: PPO

## 2018-12-28 DIAGNOSIS — N183 Chronic kidney disease, stage 3 (moderate): Secondary | ICD-10-CM | POA: Diagnosis not present

## 2018-12-28 DIAGNOSIS — E559 Vitamin D deficiency, unspecified: Secondary | ICD-10-CM | POA: Diagnosis not present

## 2018-12-28 DIAGNOSIS — J9601 Acute respiratory failure with hypoxia: Secondary | ICD-10-CM | POA: Diagnosis not present

## 2018-12-28 DIAGNOSIS — J9811 Atelectasis: Secondary | ICD-10-CM | POA: Diagnosis not present

## 2018-12-28 DIAGNOSIS — D696 Thrombocytopenia, unspecified: Secondary | ICD-10-CM | POA: Diagnosis not present

## 2018-12-28 DIAGNOSIS — E1122 Type 2 diabetes mellitus with diabetic chronic kidney disease: Secondary | ICD-10-CM | POA: Diagnosis not present

## 2018-12-28 DIAGNOSIS — E785 Hyperlipidemia, unspecified: Secondary | ICD-10-CM | POA: Diagnosis not present

## 2018-12-28 DIAGNOSIS — Z87891 Personal history of nicotine dependence: Secondary | ICD-10-CM | POA: Diagnosis not present

## 2018-12-28 DIAGNOSIS — C7911 Secondary malignant neoplasm of bladder: Secondary | ICD-10-CM | POA: Diagnosis not present

## 2018-12-28 DIAGNOSIS — Z8701 Personal history of pneumonia (recurrent): Secondary | ICD-10-CM | POA: Diagnosis not present

## 2018-12-28 DIAGNOSIS — C7802 Secondary malignant neoplasm of left lung: Secondary | ICD-10-CM | POA: Diagnosis not present

## 2018-12-28 DIAGNOSIS — I480 Paroxysmal atrial fibrillation: Secondary | ICD-10-CM | POA: Diagnosis not present

## 2018-12-28 DIAGNOSIS — C779 Secondary and unspecified malignant neoplasm of lymph node, unspecified: Secondary | ICD-10-CM | POA: Diagnosis not present

## 2018-12-28 DIAGNOSIS — K760 Fatty (change of) liver, not elsewhere classified: Secondary | ICD-10-CM | POA: Diagnosis not present

## 2018-12-28 DIAGNOSIS — C61 Malignant neoplasm of prostate: Secondary | ICD-10-CM | POA: Diagnosis not present

## 2018-12-28 DIAGNOSIS — N170 Acute kidney failure with tubular necrosis: Secondary | ICD-10-CM | POA: Diagnosis not present

## 2018-12-28 DIAGNOSIS — E876 Hypokalemia: Secondary | ICD-10-CM | POA: Diagnosis not present

## 2018-12-28 DIAGNOSIS — Z9181 History of falling: Secondary | ICD-10-CM | POA: Diagnosis not present

## 2018-12-28 DIAGNOSIS — C7801 Secondary malignant neoplasm of right lung: Secondary | ICD-10-CM | POA: Diagnosis not present

## 2018-12-28 DIAGNOSIS — I130 Hypertensive heart and chronic kidney disease with heart failure and stage 1 through stage 4 chronic kidney disease, or unspecified chronic kidney disease: Secondary | ICD-10-CM | POA: Diagnosis not present

## 2018-12-28 DIAGNOSIS — I501 Left ventricular failure: Secondary | ICD-10-CM | POA: Diagnosis not present

## 2018-12-28 NOTE — Progress Notes (Signed)
Cardiology Office Note   Date:  12/29/2018   ID:  MALEKI HIPPE, DOB October 10, 1950, MRN [id number]  PCP:  Shirline Frees, MD  Cardiologist:  Pixie Casino, MD EP: None  Chief Complaint  Patient presents with   Follow-up    atrial fibrillation      History of Present Illness: [name] Rose is a 68 y.o. 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  He was last evaluated by cardiology in consultation during a lengthy hospitalization at [facility] from 09/08/2018-10/14/2018 for weakness with likely viral PNA and AKI. Cardiology followed for atrial fibrillation early in his admission. He was discharged home on diltiazem 31m Q6H for rate control, though unable to start anticoagulation due to thrombocytopenia. TEE 06/2018 to evaluate MSSA bacteremia showed EF 55-60%, moderate MR, no valvular or catheter vegetations seen.   He presents with his wife today for follow-up of his atrial fibrillation. He has has been home from SNF for 2 weeks. He reports significant improvement in his LE weakness over the past 2 months. He has been working with home physical therapy a few times a week and reports they monitor his HR/BP which has been well controlled with HR <100 and BP typically in the 120s/80s. He has not needed to take his prn metoprolol for HR >100 since being home. He is unaware of his atrial fibrillation and does not appreciate his heart racing despite HR of 118 today. He reports walking from the parking lot to the lobby, then from the lobby to his room is the most strenuous activity he has done in quite some time. No complaints of chest pain, SOB, DOE, LE edema, dizziness, lightheadedness, or syncope. He reported being told he was in remission from bladder cancer in June and has no known plans for chemo at  this time.     Past Medical History:  Diagnosis Date   Arthritis    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 [place] 09/29/2016   Bladder tumor    Essential hypertension 06/23/2018   Goals of care, counseling/discussion 09/30/2016   History of prostate cancer followed by pcp dr hKenton Kingfisher  per pt last PSA undetectable   dx 2008-- (Stage T1c, Gleason 3+3,  PSA 4.58, vol 99cc)  s/p  radical prostatectomy (nerve sparing bilateral)    Hyperglycemia 06/23/2018   Hypertension    Lower urinary tract symptoms (LUTS)    Mild hyperlipidemia 06/23/2018   Pre-diabetes    Wears glasses     Past Surgical History:  Procedure Laterality Date   CATARACT EXTRACTION W/ INTRAOCULAR LENS  IMPLANT, BILATERAL Bilateral 2011   [place]  IR FLUORO GUIDE PORT INSERTION RIGHT  10/07/2016   IR RADIOLOGIST EVAL & MGMT  01/05/2018   IR [place] ACCESS RIGHT  10/07/2016   KNEE ARTHROSCOPY Bilateral right 2006;  left 02-15-2007   [place]  [place];  [place] 06/20/2006   bilateral nerve sparing   TEE WITHOUT CARDIOVERSION N/A 06/27/2018   Procedure: TRANSESOPHAGEAL ECHOCARDIOGRAM (TEE);  Surgeon: PNigel Mormon MD;  [facility]  Service: Cardiovascular;  Laterality: N/A;   TOTAL HIP ARTHROPLASTY Left 03/03/2015   Procedure: LEFT TOTAL HIP ARTHROPLASTY ANTERIOR APPROACH;  Surgeon: JDorna Leitz MD;  [place]  Service: Orthopedics;  Laterality: Left;   TOTAL KNEE ARTHROPLASTY Bilateral left 08-13-2009;  right 12-26-2009   TRANSURETHRAL RESECTION OF BLADDER TUMOR N/A 09/20/2016   Procedure: TRANSURETHRAL RESECTION OF BLADDER TUMOR (TURBT);  Surgeon: Franchot Gallo, MD;  [facility];  Service: Urology;  Laterality: N/A;     Current Outpatient Medications  Medication Sig Dispense Refill   atorvastatin (LIPITOR) 40 MG tablet Take 1 tablet  (40 mg total) by mouth daily at 6 PM. 30 tablet 0   Blood Glucose Monitoring Suppl (S.N.P.J.) w/Device KIT See admin instructions.     [employer] B6 100 MG tablet TAKE 2 TABLETS BY MOUTH EVERY DAY FOR A SUPPLEMENT     gabapentin (NEURONTIN) 400 MG capsule TAKE 1 CAPSULE (400 MG TOTAL) BY MOUTH 4 (FOUR) TIMES DAILY. 360 capsule 2   magnesium oxide (MAG-OX) 400 MG tablet TAKE 1 TABLET BY MOUTH TWICE A DAY FOR SUPPLEMENT     mupirocin ointment (BACTROBAN) 2 % APPLY TO BOTH ARE EVERY DAY & EVENONG FOR RASH     OneTouch Delica Lancets 81J MISC 2 (two) times daily. as directed     ONETOUCH VERIO test strip 2 (two) times daily. for testing     Potassium Chloride ER 20 MEQ TBCR TAKE 2 TABLETS BY MOUTH EVERY DAY FOR SUPPLEMENT     Vitamin D, Ergocalciferol, (DRISDOL) 1.25 MG (50000 UT) CAPS capsule TAKE 1 CAPSULE BY MOUTH EVERY SATURDAYS FOR SUPPLEMENT FOR 8 WEEKS     diltiazem (CARDIZEM LA) 240 MG 24 hr tablet Take 1 tablet (240 mg total) by mouth daily. 90 tablet 3   Hydrocortisone (GERHARDT'S BUTT CREAM) CREA Apply 1 application topically 2 (two) times daily. 1 each 0   metoprolol tartrate (LOPRESSOR) 25 MG tablet TAKE 1 TABLET BY MOUTH EVERY 6 HOURS AS NEEDED FOR HIGH HEART RATE (HR 100)     No current facility-administered medications for this visit.    Facility-Administered Medications Ordered in Other Visits  Medication Dose Route Frequency Provider Last Rate Last Dose   sodium chloride flush (NS) 0.9 % injection 10 mL  10 mL Intravenous PRN [place], Holli Humbles, NP   10 mL at 02/21/17 1038   sodium chloride flush (NS) 0.9 % injection 10 mL  10 mL Intravenous PRN Volanda Napoleon, MD   10 mL at 07/10/18 0315    Allergies:   Patient has no known allergies.    Social History:  The patient  reports that he quit smoking about 34 years ago. His smoking use included cigarettes. He quit after 16.00 years of use. He has never used smokeless tobacco. He reports current alcohol  use. He reports that he does not use drugs.   Family History:  The patient's family history includes Aneurysm in his mother; Emphysema in his father; Hypertension in his father and mother.    ROS:  Please see the history of present illness.   Otherwise, review of systems are positive for none.   All other systems are reviewed and negative.    PHYSICAL EXAM: VS:  BP 130/84    Pulse (!) 118    Ht '6\' 2"'  (1.88 m)    Wt 232 lb 6.4 oz (105.4 kg)    SpO2 94%    BMI 29.84 kg/m  , BMI Body mass index is 29.84 kg/m. GEN: Well nourished, well developed, in no acute distress HEENT: sclera anicteric Neck: no JVD, carotid bruits, or masses Cardiac: RRR; no murmurs, rubs, or gallops,no  edema  Respiratory:  clear to auscultation bilaterally, normal work of breathing GI: soft, nontender, nondistended, + BS MS: no deformity or atrophy Skin: warm and dry, no rash Neuro:  Strength and sensation are intact Psych: euthymic mood, full affect   EKG:  EKG is ordered today. The ekg ordered today demonstrates atrial fibrillation with RVR, rate 118, QTC 392, no STE/D, no TWI.    Recent Labs: 06/23/2018: TSH 0.455 09/29/2018: ALT 43 10/09/2018: Magnesium 1.9 10/14/2018: BUN 50; Creatinine, Ser 2.59; Hemoglobin 7.1; Platelets 115; Potassium 3.7; Sodium 134    Lipid Panel    Component Value Date/Time   CHOL 159 10/05/2018 0411   CHOL 163 10/11/2016 0749   TRIG 131 10/05/2018 0411   TRIG 116 10/11/2016 0749   HDL 47 10/05/2018 0411   HDL 39 (L) 10/11/2016 0749   CHOLHDL 3.4 10/05/2018 0411   VLDL 26 10/05/2018 0411   LDLCALC 86 10/05/2018 0411   LDLCALC 101 (H) 10/11/2016 0749      Wt Readings from Last 3 Encounters:  12/29/18 232 lb 6.4 oz (105.4 kg)  10/14/18 240 lb (108.9 kg)  08/15/18 253 lb (114.8 kg)      Other studies Reviewed: Additional studies/ records that were reviewed today include:   TEE 06/27/2018: 1. The left ventricle has normal systolic function, with an ejection fraction of  55-60%.  2. Moderate mitral regurgitation.  3. No valvular vegetations seen.  4. 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    ASSESSMENT AND PLAN:  1. Persistent atrial fibrillation: Rate 118 today today. Unable to start anticoagulation due to history of anemia and thrombocytopenia. Now that he has completed chemotherapy, could consider repeat CBC at his next visit to determine if he is a candidate for anticoagulation going forward given his CHA2DS2-VASc Score of 2 (HTN and Age 58-74). He has been on diltiazem 74m Q6H for rate control - unclear why he is not on consolidated doses. He has not needed his prn metoprolol for HR>100. HR today is 118, possible this is elevated due to efforts getting here from the parking lot. - Recommended he take his metoprolol upon return home. He will continue to monitor his HR a few times a day to determine if standing dosing is needed.  - Will consolidate diltiazem to 2445mdaily - Continue ongoing monitoring to determine if candidacy for anticoagulation changes.   2. HTN: BP 130/84 today - Continue diltiazem  3. HLD: LDL 86 09/2018 - Continue atorvastatin  4. Mitral regurgitation: moderate on TEE 06/2018 - Will continue routine outpatient monitoring.   5. CKD stage 4: Cr 2.59 10/2018; he required dialysis during admission 10/2018 with improvement in Cr.  - Will check a BMET today for close monitoring     Current medicines are reviewed at length with the patient today.  The patient does not have concerns regarding medicines.  The following changes have been made:  Consolidate diltiazem to 24066maily  Labs/ tests ordered today include:   Orders Placed This Encounter  Procedures   Basic metabolic panel   Magnesium   EKG 12-Lead     Disposition:   FU with Dr. HilDebara Pickett 3 months  Signed, KriAbigail ButtsA-C  12/29/2018 1:27 PM

## 2018-12-29 ENCOUNTER — Encounter: Payer: Self-pay | Admitting: Medical

## 2018-12-29 ENCOUNTER — Ambulatory Visit (INDEPENDENT_AMBULATORY_CARE_PROVIDER_SITE_OTHER): Payer: PPO | Admitting: Medical

## 2018-12-29 ENCOUNTER — Other Ambulatory Visit: Payer: Self-pay

## 2018-12-29 VITALS — BP 130/84 | HR 118 | Ht 74.0 in | Wt 232.4 lb

## 2018-12-29 DIAGNOSIS — E782 Mixed hyperlipidemia: Secondary | ICD-10-CM | POA: Diagnosis not present

## 2018-12-29 DIAGNOSIS — N183 Chronic kidney disease, stage 3 unspecified: Secondary | ICD-10-CM

## 2018-12-29 DIAGNOSIS — I10 Essential (primary) hypertension: Secondary | ICD-10-CM | POA: Diagnosis not present

## 2018-12-29 DIAGNOSIS — I4891 Unspecified atrial fibrillation: Secondary | ICD-10-CM | POA: Diagnosis not present

## 2018-12-29 DIAGNOSIS — I340 Nonrheumatic mitral (valve) insufficiency: Secondary | ICD-10-CM | POA: Diagnosis not present

## 2018-12-29 MED ORDER — DILTIAZEM HCL ER COATED BEADS 240 MG PO TB24: 240.0000 mg | ORAL_TABLET | Freq: Every day | ORAL | 3 refills | Status: DC

## 2018-12-29 NOTE — Patient Instructions (Addendum)
Medication Instructions:   TAKE Diltiazem 240 mg daily  Take Metoprolol Tartrate when you get home today from appointment  If you need a refill on your cardiac medications before your next appointment, please call your pharmacy.   Lab work: You will need to have labs (blood work) drawn today:  BMET  Magnesium   If you have labs (blood work) drawn today and your tests are completely normal, you will receive your results only by: Marland Kitchen MyChart Message (if you have MyChart) OR . A paper copy in the mail If you have any lab test that is abnormal or we need to change your treatment, we will call you to review the results.  Testing/Procedures: NONE ordered at this time of appointment   Follow-Up: At [facility], you and your health needs are our priority.  As part of our continuing mission to provide you with exceptional heart care, we have created designated Provider Care Teams.  These Care Teams include your primary Cardiologist (physician) and Advanced Practice Providers (APPs -  Physician Assistants and Nurse Practitioners) who all work together to provide you with the care you need, when you need it. . You will need a follow up appointment in 3 months with Pixie Casino, MD    Any Other Special Instructions Will Be Listed Below (If Applicable).

## 2018-12-30 LAB — BASIC METABOLIC PANEL
BUN/Creatinine Ratio: 22 (ref 10–24)
BUN: 36 mg/dL — ABNORMAL HIGH (ref 8–27)
CO2: 23 mmol/L (ref 20–29)
Calcium: 9.3 mg/dL (ref 8.6–10.2)
Chloride: 100 mmol/L (ref 96–106)
Creatinine, Ser: 1.65 mg/dL — ABNORMAL HIGH (ref 0.76–1.27)
GFR calc Af Amer: 49 mL/min/{1.73_m2} — ABNORMAL LOW (ref >59)
GFR calc non Af Amer: 42 mL/min/{1.73_m2} — ABNORMAL LOW (ref >59)
Glucose: 109 mg/dL — ABNORMAL HIGH (ref 65–99)
Potassium: 4.8 mmol/L (ref 3.5–5.2)
Sodium: 137 mmol/L (ref 134–144)

## 2018-12-30 LAB — MAGNESIUM: Magnesium: 1.9 mg/dL (ref 1.6–2.3)

## 2019-01-04 DIAGNOSIS — D494 Neoplasm of unspecified behavior of bladder: Secondary | ICD-10-CM | POA: Diagnosis not present

## 2019-01-04 DIAGNOSIS — I10 Essential (primary) hypertension: Secondary | ICD-10-CM | POA: Diagnosis not present

## 2019-01-04 DIAGNOSIS — N183 Chronic kidney disease, stage 3 (moderate): Secondary | ICD-10-CM | POA: Diagnosis not present

## 2019-01-04 DIAGNOSIS — E7800 Pure hypercholesterolemia, unspecified: Secondary | ICD-10-CM | POA: Diagnosis not present

## 2019-01-04 DIAGNOSIS — I480 Paroxysmal atrial fibrillation: Secondary | ICD-10-CM | POA: Diagnosis not present

## 2019-01-04 DIAGNOSIS — R7303 Prediabetes: Secondary | ICD-10-CM | POA: Diagnosis not present

## 2019-01-04 DIAGNOSIS — Z8546 Personal history of malignant neoplasm of prostate: Secondary | ICD-10-CM | POA: Diagnosis not present

## 2019-01-08 ENCOUNTER — Telehealth: Payer: Self-pay | Admitting: Hematology & Oncology

## 2019-01-08 NOTE — Telephone Encounter (Signed)
Faxed medical records to: [facility] F: [phone]   last 3 mos or last visit     COPY SCANNED

## 2019-01-11 DIAGNOSIS — C7801 Secondary malignant neoplasm of right lung: Secondary | ICD-10-CM | POA: Diagnosis not present

## 2019-01-11 DIAGNOSIS — I130 Hypertensive heart and chronic kidney disease with heart failure and stage 1 through stage 4 chronic kidney disease, or unspecified chronic kidney disease: Secondary | ICD-10-CM | POA: Diagnosis not present

## 2019-01-11 DIAGNOSIS — E876 Hypokalemia: Secondary | ICD-10-CM | POA: Diagnosis not present

## 2019-01-11 DIAGNOSIS — C7911 Secondary malignant neoplasm of bladder: Secondary | ICD-10-CM | POA: Diagnosis not present

## 2019-01-11 DIAGNOSIS — E559 Vitamin D deficiency, unspecified: Secondary | ICD-10-CM | POA: Diagnosis not present

## 2019-01-11 DIAGNOSIS — Z87891 Personal history of nicotine dependence: Secondary | ICD-10-CM | POA: Diagnosis not present

## 2019-01-11 DIAGNOSIS — N170 Acute kidney failure with tubular necrosis: Secondary | ICD-10-CM | POA: Diagnosis not present

## 2019-01-11 DIAGNOSIS — C7802 Secondary malignant neoplasm of left lung: Secondary | ICD-10-CM | POA: Diagnosis not present

## 2019-01-11 DIAGNOSIS — C779 Secondary and unspecified malignant neoplasm of lymph node, unspecified: Secondary | ICD-10-CM | POA: Diagnosis not present

## 2019-01-11 DIAGNOSIS — E785 Hyperlipidemia, unspecified: Secondary | ICD-10-CM | POA: Diagnosis not present

## 2019-01-11 DIAGNOSIS — I501 Left ventricular failure: Secondary | ICD-10-CM | POA: Diagnosis not present

## 2019-01-11 DIAGNOSIS — J9811 Atelectasis: Secondary | ICD-10-CM | POA: Diagnosis not present

## 2019-01-11 DIAGNOSIS — C61 Malignant neoplasm of prostate: Secondary | ICD-10-CM | POA: Diagnosis not present

## 2019-01-11 DIAGNOSIS — Z9181 History of falling: Secondary | ICD-10-CM | POA: Diagnosis not present

## 2019-01-11 DIAGNOSIS — E1122 Type 2 diabetes mellitus with diabetic chronic kidney disease: Secondary | ICD-10-CM | POA: Diagnosis not present

## 2019-01-11 DIAGNOSIS — K760 Fatty (change of) liver, not elsewhere classified: Secondary | ICD-10-CM | POA: Diagnosis not present

## 2019-01-11 DIAGNOSIS — N183 Chronic kidney disease, stage 3 unspecified: Secondary | ICD-10-CM | POA: Diagnosis not present

## 2019-01-11 DIAGNOSIS — J9601 Acute respiratory failure with hypoxia: Secondary | ICD-10-CM | POA: Diagnosis not present

## 2019-01-11 DIAGNOSIS — Z8701 Personal history of pneumonia (recurrent): Secondary | ICD-10-CM | POA: Diagnosis not present

## 2019-01-11 DIAGNOSIS — D696 Thrombocytopenia, unspecified: Secondary | ICD-10-CM | POA: Diagnosis not present

## 2019-01-11 DIAGNOSIS — I480 Paroxysmal atrial fibrillation: Secondary | ICD-10-CM | POA: Diagnosis not present

## 2019-01-29 DIAGNOSIS — M6281 Muscle weakness (generalized): Secondary | ICD-10-CM | POA: Diagnosis not present

## 2019-01-31 LAB — BLOOD GAS, ARTERIAL
Acid-base deficit: 10.1 mmol/L — ABNORMAL HIGH (ref 0.0–2.0)
Acid-base deficit: 9.2 mmol/L — ABNORMAL HIGH (ref 0.0–2.0)
Bicarbonate: 16.7 mmol/L — ABNORMAL LOW (ref 20.0–28.0)
Bicarbonate: 20.7 mmol/L (ref 20.0–28.0)
Drawn by: 257701
Drawn by: 514251
FIO2: 40
FIO2: 50
MECHVT: 490 mL
MECHVT: 570 mL
O2 Saturation: 89.5 %
O2 Saturation: 90.7 %
PEEP: 10 cmH2O
PEEP: 8 cmH2O
Patient temperature: 98.6
Patient temperature: 98.6
RATE: 30 resp/min
RATE: 30 resp/min
pCO2 arterial: 39 mmHg (ref 32.0–48.0)
pCO2 arterial: 74.4 mmHg (ref 32.0–48.0)
pH, Arterial: 7.072 — CL (ref 7.350–7.450)
pH, Arterial: 7.255 — ABNORMAL LOW (ref 7.350–7.450)
pO2, Arterial: 66.3 mmHg — ABNORMAL LOW (ref 83.0–108.0)
pO2, Arterial: 82.5 mmHg — ABNORMAL LOW (ref 83.0–108.0)

## 2019-02-01 DIAGNOSIS — Z9181 History of falling: Secondary | ICD-10-CM | POA: Diagnosis not present

## 2019-02-01 DIAGNOSIS — C7802 Secondary malignant neoplasm of left lung: Secondary | ICD-10-CM | POA: Diagnosis not present

## 2019-02-01 DIAGNOSIS — I130 Hypertensive heart and chronic kidney disease with heart failure and stage 1 through stage 4 chronic kidney disease, or unspecified chronic kidney disease: Secondary | ICD-10-CM | POA: Diagnosis not present

## 2019-02-01 DIAGNOSIS — C779 Secondary and unspecified malignant neoplasm of lymph node, unspecified: Secondary | ICD-10-CM | POA: Diagnosis not present

## 2019-02-01 DIAGNOSIS — I480 Paroxysmal atrial fibrillation: Secondary | ICD-10-CM | POA: Diagnosis not present

## 2019-02-01 DIAGNOSIS — J9601 Acute respiratory failure with hypoxia: Secondary | ICD-10-CM | POA: Diagnosis not present

## 2019-02-01 DIAGNOSIS — E785 Hyperlipidemia, unspecified: Secondary | ICD-10-CM | POA: Diagnosis not present

## 2019-02-01 DIAGNOSIS — Z87891 Personal history of nicotine dependence: Secondary | ICD-10-CM | POA: Diagnosis not present

## 2019-02-01 DIAGNOSIS — J9811 Atelectasis: Secondary | ICD-10-CM | POA: Diagnosis not present

## 2019-02-01 DIAGNOSIS — N170 Acute kidney failure with tubular necrosis: Secondary | ICD-10-CM | POA: Diagnosis not present

## 2019-02-01 DIAGNOSIS — Z8701 Personal history of pneumonia (recurrent): Secondary | ICD-10-CM | POA: Diagnosis not present

## 2019-02-01 DIAGNOSIS — E1122 Type 2 diabetes mellitus with diabetic chronic kidney disease: Secondary | ICD-10-CM | POA: Diagnosis not present

## 2019-02-01 DIAGNOSIS — I501 Left ventricular failure: Secondary | ICD-10-CM | POA: Diagnosis not present

## 2019-02-01 DIAGNOSIS — K760 Fatty (change of) liver, not elsewhere classified: Secondary | ICD-10-CM | POA: Diagnosis not present

## 2019-02-01 DIAGNOSIS — C61 Malignant neoplasm of prostate: Secondary | ICD-10-CM | POA: Diagnosis not present

## 2019-02-01 DIAGNOSIS — C7801 Secondary malignant neoplasm of right lung: Secondary | ICD-10-CM | POA: Diagnosis not present

## 2019-02-01 DIAGNOSIS — N183 Chronic kidney disease, stage 3 unspecified: Secondary | ICD-10-CM | POA: Diagnosis not present

## 2019-02-01 DIAGNOSIS — E876 Hypokalemia: Secondary | ICD-10-CM | POA: Diagnosis not present

## 2019-02-01 DIAGNOSIS — C7911 Secondary malignant neoplasm of bladder: Secondary | ICD-10-CM | POA: Diagnosis not present

## 2019-02-01 DIAGNOSIS — D696 Thrombocytopenia, unspecified: Secondary | ICD-10-CM | POA: Diagnosis not present

## 2019-02-01 DIAGNOSIS — E559 Vitamin D deficiency, unspecified: Secondary | ICD-10-CM | POA: Diagnosis not present

## 2019-02-15 DIAGNOSIS — G629 Polyneuropathy, unspecified: Secondary | ICD-10-CM | POA: Diagnosis not present

## 2019-02-15 DIAGNOSIS — R531 Weakness: Secondary | ICD-10-CM | POA: Diagnosis not present

## 2019-02-15 DIAGNOSIS — R5381 Other malaise: Secondary | ICD-10-CM | POA: Diagnosis not present

## 2019-02-15 DIAGNOSIS — R2689 Other abnormalities of gait and mobility: Secondary | ICD-10-CM | POA: Diagnosis not present

## 2019-02-15 DIAGNOSIS — Z7409 Other reduced mobility: Secondary | ICD-10-CM | POA: Diagnosis not present

## 2019-02-16 DIAGNOSIS — R5381 Other malaise: Secondary | ICD-10-CM | POA: Diagnosis not present

## 2019-02-16 DIAGNOSIS — R2689 Other abnormalities of gait and mobility: Secondary | ICD-10-CM | POA: Diagnosis not present

## 2019-02-16 DIAGNOSIS — Z7409 Other reduced mobility: Secondary | ICD-10-CM | POA: Diagnosis not present

## 2019-02-16 DIAGNOSIS — R531 Weakness: Secondary | ICD-10-CM | POA: Diagnosis not present

## 2019-02-16 DIAGNOSIS — G629 Polyneuropathy, unspecified: Secondary | ICD-10-CM | POA: Diagnosis not present

## 2019-02-20 DIAGNOSIS — B029 Zoster without complications: Secondary | ICD-10-CM | POA: Diagnosis not present

## 2019-02-28 DIAGNOSIS — Z7409 Other reduced mobility: Secondary | ICD-10-CM | POA: Diagnosis not present

## 2019-02-28 DIAGNOSIS — R531 Weakness: Secondary | ICD-10-CM | POA: Diagnosis not present

## 2019-02-28 DIAGNOSIS — R5381 Other malaise: Secondary | ICD-10-CM | POA: Diagnosis not present

## 2019-02-28 DIAGNOSIS — R2689 Other abnormalities of gait and mobility: Secondary | ICD-10-CM | POA: Diagnosis not present

## 2019-02-28 DIAGNOSIS — G629 Polyneuropathy, unspecified: Secondary | ICD-10-CM | POA: Diagnosis not present

## 2019-03-01 DIAGNOSIS — M6281 Muscle weakness (generalized): Secondary | ICD-10-CM | POA: Diagnosis not present

## 2019-03-05 DIAGNOSIS — G629 Polyneuropathy, unspecified: Secondary | ICD-10-CM | POA: Diagnosis not present

## 2019-03-05 DIAGNOSIS — Z7409 Other reduced mobility: Secondary | ICD-10-CM | POA: Diagnosis not present

## 2019-03-05 DIAGNOSIS — R531 Weakness: Secondary | ICD-10-CM | POA: Diagnosis not present

## 2019-03-05 DIAGNOSIS — R5381 Other malaise: Secondary | ICD-10-CM | POA: Diagnosis not present

## 2019-03-05 DIAGNOSIS — R2689 Other abnormalities of gait and mobility: Secondary | ICD-10-CM | POA: Diagnosis not present

## 2019-03-07 DIAGNOSIS — R531 Weakness: Secondary | ICD-10-CM | POA: Diagnosis not present

## 2019-03-07 DIAGNOSIS — G629 Polyneuropathy, unspecified: Secondary | ICD-10-CM | POA: Diagnosis not present

## 2019-03-07 DIAGNOSIS — R2689 Other abnormalities of gait and mobility: Secondary | ICD-10-CM | POA: Diagnosis not present

## 2019-03-07 DIAGNOSIS — Z7409 Other reduced mobility: Secondary | ICD-10-CM | POA: Diagnosis not present

## 2019-03-07 DIAGNOSIS — R5381 Other malaise: Secondary | ICD-10-CM | POA: Diagnosis not present

## 2019-03-12 ENCOUNTER — Other Ambulatory Visit: Payer: Self-pay | Admitting: Family

## 2019-03-12 ENCOUNTER — Telehealth: Payer: Self-pay | Admitting: *Deleted

## 2019-03-12 DIAGNOSIS — C772 Secondary and unspecified malignant neoplasm of intra-abdominal lymph nodes: Secondary | ICD-10-CM

## 2019-03-12 DIAGNOSIS — C679 Malignant neoplasm of bladder, unspecified: Secondary | ICD-10-CM

## 2019-03-12 NOTE — Telephone Encounter (Signed)
Received call from patient stating that he is feeling well enough to come in to see Dr. Marin [name] Rose.  Dr. Marin [name] Rose notified.  PET scan ordered and will get him in soon after PET is approved and scheduled.  Scheduling message sent

## 2019-03-13 DIAGNOSIS — G629 Polyneuropathy, unspecified: Secondary | ICD-10-CM | POA: Diagnosis not present

## 2019-03-13 DIAGNOSIS — R2689 Other abnormalities of gait and mobility: Secondary | ICD-10-CM | POA: Diagnosis not present

## 2019-03-13 DIAGNOSIS — R531 Weakness: Secondary | ICD-10-CM | POA: Diagnosis not present

## 2019-03-13 DIAGNOSIS — R5381 Other malaise: Secondary | ICD-10-CM | POA: Diagnosis not present

## 2019-03-13 DIAGNOSIS — Z7409 Other reduced mobility: Secondary | ICD-10-CM | POA: Diagnosis not present

## 2019-03-16 ENCOUNTER — Ambulatory Visit (HOSPITAL_COMMUNITY)
Admission: RE | Admit: 2019-03-16 | Discharge: 2019-03-16 | Disposition: A | Discharge status: Home / Self Care | Payer: PPO | Source: Ambulatory Visit | Attending: Family | Admitting: Family

## 2019-03-16 ENCOUNTER — Other Ambulatory Visit: Payer: Self-pay

## 2019-03-16 DIAGNOSIS — N189 Chronic kidney disease, unspecified: Secondary | ICD-10-CM | POA: Insufficient documentation

## 2019-03-16 DIAGNOSIS — C7911 Secondary malignant neoplasm of bladder: Secondary | ICD-10-CM | POA: Diagnosis not present

## 2019-03-16 DIAGNOSIS — Z79899 Other long term (current) drug therapy: Secondary | ICD-10-CM | POA: Insufficient documentation

## 2019-03-16 DIAGNOSIS — C679 Malignant neoplasm of bladder, unspecified: Secondary | ICD-10-CM | POA: Diagnosis not present

## 2019-03-16 DIAGNOSIS — I129 Hypertensive chronic kidney disease with stage 1 through stage 4 chronic kidney disease, or unspecified chronic kidney disease: Secondary | ICD-10-CM | POA: Insufficient documentation

## 2019-03-16 DIAGNOSIS — C801 Malignant (primary) neoplasm, unspecified: Secondary | ICD-10-CM | POA: Diagnosis not present

## 2019-03-16 DIAGNOSIS — C772 Secondary and unspecified malignant neoplasm of intra-abdominal lymph nodes: Secondary | ICD-10-CM | POA: Diagnosis not present

## 2019-03-16 DIAGNOSIS — G629 Polyneuropathy, unspecified: Secondary | ICD-10-CM | POA: Diagnosis not present

## 2019-03-16 DIAGNOSIS — R531 Weakness: Secondary | ICD-10-CM | POA: Diagnosis not present

## 2019-03-16 DIAGNOSIS — E785 Hyperlipidemia, unspecified: Secondary | ICD-10-CM | POA: Insufficient documentation

## 2019-03-16 DIAGNOSIS — R5381 Other malaise: Secondary | ICD-10-CM | POA: Diagnosis not present

## 2019-03-16 DIAGNOSIS — R2689 Other abnormalities of gait and mobility: Secondary | ICD-10-CM | POA: Diagnosis not present

## 2019-03-16 DIAGNOSIS — Z7409 Other reduced mobility: Secondary | ICD-10-CM | POA: Diagnosis not present

## 2019-03-16 LAB — GLUCOSE, CAPILLARY: Glucose-Capillary: 98 mg/dL (ref 70–99)

## 2019-03-16 MED ORDER — FLUDEOXYGLUCOSE F - 18 (FDG) INJECTION
12.8700 | Freq: Once | INTRAVENOUS | Status: AC | PRN
  Administered 2019-03-16: 12.87 via INTRAVENOUS

## 2019-03-19 ENCOUNTER — Encounter: Payer: Self-pay | Admitting: Hematology & Oncology

## 2019-03-19 ENCOUNTER — Other Ambulatory Visit: Payer: Self-pay

## 2019-03-19 ENCOUNTER — Inpatient Hospital Stay: Payer: PPO | Attending: Hematology & Oncology | Admitting: Hematology & Oncology

## 2019-03-19 ENCOUNTER — Inpatient Hospital Stay: Payer: PPO

## 2019-03-19 VITALS — BP 152/95 | HR 94 | Temp 97.3°F | Resp 18 | Wt 258.0 lb

## 2019-03-19 DIAGNOSIS — R531 Weakness: Secondary | ICD-10-CM | POA: Diagnosis not present

## 2019-03-19 DIAGNOSIS — C772 Secondary and unspecified malignant neoplasm of intra-abdominal lymph nodes: Secondary | ICD-10-CM | POA: Diagnosis not present

## 2019-03-19 DIAGNOSIS — R2689 Other abnormalities of gait and mobility: Secondary | ICD-10-CM | POA: Diagnosis not present

## 2019-03-19 DIAGNOSIS — G629 Polyneuropathy, unspecified: Secondary | ICD-10-CM | POA: Insufficient documentation

## 2019-03-19 DIAGNOSIS — I4891 Unspecified atrial fibrillation: Secondary | ICD-10-CM | POA: Insufficient documentation

## 2019-03-19 DIAGNOSIS — Z95828 Presence of other vascular implants and grafts: Secondary | ICD-10-CM

## 2019-03-19 DIAGNOSIS — K5780 Diverticulitis of intestine, part unspecified, with perforation and abscess without bleeding: Secondary | ICD-10-CM | POA: Insufficient documentation

## 2019-03-19 DIAGNOSIS — D508 Other iron deficiency anemias: Secondary | ICD-10-CM

## 2019-03-19 DIAGNOSIS — R5381 Other malaise: Secondary | ICD-10-CM | POA: Diagnosis not present

## 2019-03-19 DIAGNOSIS — Z7409 Other reduced mobility: Secondary | ICD-10-CM | POA: Diagnosis not present

## 2019-03-19 DIAGNOSIS — C679 Malignant neoplasm of bladder, unspecified: Secondary | ICD-10-CM | POA: Insufficient documentation

## 2019-03-19 DIAGNOSIS — Z79899 Other long term (current) drug therapy: Secondary | ICD-10-CM | POA: Diagnosis not present

## 2019-03-19 LAB — CMP (CANCER CENTER ONLY)
ALT: 13 U/L (ref 0–44)
AST: 22 U/L (ref 15–41)
Albumin: 3.9 g/dL (ref 3.5–5.0)
Alkaline Phosphatase: 62 U/L (ref 38–126)
Anion gap: 9 (ref 5–15)
BUN: 33 mg/dL — ABNORMAL HIGH (ref 8–23)
CO2: 28 mmol/L (ref 22–32)
Calcium: 9.2 mg/dL (ref 8.9–10.3)
Chloride: 103 mmol/L (ref 98–111)
Creatinine: 2.05 mg/dL — ABNORMAL HIGH (ref 0.61–1.24)
GFR, Est AFR Am: 38 mL/min — ABNORMAL LOW (ref >60)
GFR, Estimated: 33 mL/min — ABNORMAL LOW (ref >60)
Glucose, Bld: 180 mg/dL — ABNORMAL HIGH (ref 70–99)
Potassium: 4.2 mmol/L (ref 3.5–5.1)
Sodium: 140 mmol/L (ref 135–145)
Total Bilirubin: 0.6 mg/dL (ref 0.3–1.2)
Total Protein: 6.8 g/dL (ref 6.5–8.1)

## 2019-03-19 LAB — CBC WITH DIFFERENTIAL (CANCER CENTER ONLY)
Abs Immature Granulocytes: 0.02 10*3/uL (ref 0.00–0.07)
Basophils Absolute: 0 10*3/uL (ref 0.0–0.1)
Basophils Relative: 0 %
Eosinophils Absolute: 0.1 10*3/uL (ref 0.0–0.5)
Eosinophils Relative: 2 %
HCT: 36 % — ABNORMAL LOW (ref 39.0–52.0)
Hemoglobin: 11.7 g/dL — ABNORMAL LOW (ref 13.0–17.0)
Immature Granulocytes: 0 %
Lymphocytes Relative: 26 %
Lymphs Abs: 1.7 10*3/uL (ref 0.7–4.0)
MCH: 30.5 pg (ref 26.0–34.0)
MCHC: 32.5 g/dL (ref 30.0–36.0)
MCV: 94 fL (ref 80.0–100.0)
Monocytes Absolute: 0.5 10*3/uL (ref 0.1–1.0)
Monocytes Relative: 7 %
Neutro Abs: 4.1 10*3/uL (ref 1.7–7.7)
Neutrophils Relative %: 65 %
Platelet Count: 159 10*3/uL (ref 150–400)
RBC: 3.83 MIL/uL — ABNORMAL LOW (ref 4.22–5.81)
RDW: 14.3 % (ref 11.5–15.5)
WBC Count: 6.4 10*3/uL (ref 4.0–10.5)
nRBC: 0 % (ref 0.0–0.2)

## 2019-03-19 MED ORDER — SODIUM CHLORIDE 0.9% FLUSH
10.0000 mL | INTRAVENOUS | Status: DC | PRN
  Administered 2019-03-19: 10 mL via INTRAVENOUS
  Filled 2019-03-19: qty 10

## 2019-03-19 MED ORDER — HEPARIN SOD (PORK) LOCK FLUSH 10 UNIT/ML IV SOLN: 10.0000 [IU] | Freq: Once | INTRAVENOUS | Status: DC

## 2019-03-19 MED ORDER — DILTIAZEM HCL ER COATED BEADS 240 MG PO TB24: 240.0000 mg | ORAL_TABLET | Freq: Every day | ORAL | 3 refills | Status: DC

## 2019-03-19 MED ORDER — APIXABAN 5 MG PO TABS: 5.0000 mg | ORAL_TABLET | Freq: Two times a day (BID) | ORAL | 6 refills | Status: DC

## 2019-03-19 MED ORDER — HEPARIN SOD (PORK) LOCK FLUSH 100 UNIT/ML IV SOLN
500.0000 [IU] | Freq: Once | INTRAVENOUS | Status: AC | PRN
  Administered 2019-03-19: 500 [IU]
  Filled 2019-03-19: qty 5

## 2019-03-19 MED ORDER — HEPARIN SOD (PORK) LOCK FLUSH 100 UNIT/ML IV SOLN
500.0000 [IU] | Freq: Once | INTRAVENOUS | Status: DC
  Filled 2019-03-19: qty 5

## 2019-03-19 NOTE — Progress Notes (Signed)
DISCONTINUE OFF PATHWAY REGIMEN - Bladder   [id number]:Enfortumab vedotin-ejfv 1.25 mg/kg IV D1,8,15 q28 Days:   A cycle is every 28 days:     Enfortumab vedotin-ejfv   **Always confirm dose/schedule in your pharmacy ordering system**  REASON: Toxicities / Adverse Event PRIOR TREATMENT: Off Pathway: Enfortumab vedotin-ejfv 1.25 mg/kg IV D1,8,15 q28 Days TREATMENT RESPONSE: Complete Response (CR)  START OFF PATHWAY REGIMEN - Bladder   [id number]:ICE (Ifosfamide D1,2,3) q21 Days (Outpatient):   A cycle is every 21 days:     Ifosfamide      [place]      [place]      Carboplatin      Etoposide      [id number]-[id number]   **Always confirm dose/schedule in your pharmacy ordering system**  Patient Characteristics: Advanced/Metastatic Disease, Third Line and Beyond Therapeutic Status: Advanced/Metastatic Disease Line of Therapy: Third Line and Beyond  Intent of Therapy: Non-Curative / Palliative Intent, Discussed with Patient

## 2019-03-19 NOTE — Progress Notes (Signed)
Hematology and Oncology Follow Up Visit  [name] Rose [id number] 28-Jul-1950 68 y.o. 03/19/2019   Principle Diagnosis:  Metastatic high-grade bladder cancer - recurrent Diverticular abscess - E.coli  Past Therapy: Atezolizumab 1224m IV q 3 wks - s/p cycle #4 - d/c due to progression. Taxotere 838mm2 IV q 3 wks - s/p cycle #3 - d/c due to progression  Current Therapy:   M-VAC - s/p cycle #8  -- d/c on 07/10/2018 due to blood counts Padcev -- s/p cycle #1 on 07/17/2018 Ifosfamide/Gemzar -- cycle #1 to start 04/16/2019   Interim History: [name] Rose here today for a long awaited follow-up.  Last him we saw him in the office was back in May.  He really has had a miserable summer.  He was hospitalized.  He had pneumonia.  He had atrial fibrillation.  He still has atrial fibrillation.  He has bad neuropathy.    Basically, he was hospitalized for almost a month.  He then had to go to a nursing home/rehab facility for a couple months.  He now is home.  He is getting some physical therapy at home.  He actually looks pretty good.  He is eating okay.  He does not have any problems with nausea or vomiting.  He has had no issues with his bowels or bladder.   He did have a PET scan done on 03/16/2019.  Unfortunately, the PET scan does show that his disease is progressing.  I suppose this should not be a surprise since he really has had no treatment for at least 7 months.  The PET scan showed that he had adenopathy.  He only has adenopathy from the beginning.  Thankfully, his lung, liver and bones are still free of disease.  The problem that we have with him is the neuropathy.  We really cannot give him anything that is going to really worsen the neuropathy.  He really had a tough time with neuropathy.  I am sure the neuropathy is part and parcel from the [place].  The Padcev really did work for him.  Unfortunately we really cannot use Padcev due to the neuropathy issue.  I think that will be reasonable to try for him would be ifosfamide/gemcitabine.  I think this combination would be worthwhile.  I do have to watch out with respect to him being leukopenic.  I will see to watch out with him being thrombocytopenic.  I do think he needs to be on some kind of anticoagulation since she still has the atrial fibrillation.  Overall, I would have to say that his performance status is ECOG 1.    Medications:  Allergies as of 03/19/2019   No Known Allergies     Medication List       Accurate as of March 19, 2019  9:48 AM. If you have any questions, ask your nurse or doctor.        atorvastatin 40 MG tablet Commonly known as: LIPITOR Take 1 tablet (40 mg total) by mouth daily at 6 PM.   CVS B6 100 MG tablet Generic drug: pyridoxine TAKE 2 TABLETS BY MOUTH EVERY DAY FOR A SUPPLEMENT   diltiazem 240 MG 24 hr tablet Commonly known as: Cardizem LA Take 1 tablet (240 mg total) by mouth daily.   gabapentin 400 MG capsule Commonly known as: NEURONTIN TAKE 1 CAPSULE (400 MG TOTAL) BY MOUTH 4 (FOUR) TIMES DAILY.   Gerhardt's butt cream Crea Apply 1 application topically 2 (  two) times daily.   magnesium oxide 400 MG tablet Commonly known as: MAG-OX TAKE 1 TABLET BY MOUTH TWICE A DAY FOR SUPPLEMENT   metoprolol succinate 25 MG 24 hr tablet Commonly known as: TOPROL-XL Take 25 mg by mouth at bedtime.   metoprolol tartrate 25 MG tablet Commonly known as: LOPRESSOR TAKE 1 TABLET BY MOUTH EVERY 6 HOURS AS NEEDED FOR HIGH HEART RATE (HR 100)   mupirocin ointment 2 % Commonly known as: BACTROBAN APPLY TO BOTH ARE EVERY DAY & EVENONG FOR RASH   OneTouch Delica Lancets 53Z Misc 2 (two) times daily. as directed   [place] w/Device Kit See admin instructions.   OneTouch Verio test strip Generic drug: glucose blood 2 (two) times daily. for testing   Potassium Chloride ER 20 MEQ Tbcr TAKE 2 TABLETS BY MOUTH EVERY DAY FOR  SUPPLEMENT   traMADol 50 MG tablet Commonly known as: ULTRAM Take 50 mg by mouth daily as needed.   valACYclovir 1000 MG tablet Commonly known as: VALTREX Take 1,000 mg by mouth 3 (three) times daily.   Vitamin D (Ergocalciferol) 1.25 MG (50000 UT) Caps capsule Commonly known as: DRISDOL TAKE 1 CAPSULE BY MOUTH EVERY SATURDAYS FOR SUPPLEMENT FOR 8 WEEKS       Allergies: No Known Allergies  Past Medical History, Surgical history, Social history, and Family History were reviewed and updated.  Review of Systems: Review of Systems  Constitutional: Negative.   HENT: Negative.   Eyes: Negative.   Respiratory: Negative.   Cardiovascular: Negative.   Gastrointestinal: Negative.   Genitourinary: Negative.   Musculoskeletal: Negative.   Skin: Negative.   Neurological: Positive for tingling.  Endo/Heme/Allergies: Negative.   Psychiatric/Behavioral: Negative.      Physical Exam:  weight is 258 lb (117 kg). His temporal temperature is 97.3 F (36.3 C) (abnormal). His blood pressure is 152/95 (abnormal) and his pulse is 94. His respiration is 18 and oxygen saturation is 98%.   Wt Readings from Last 3 Encounters:  03/19/19 258 lb (117 kg)  12/29/18 232 lb 6.4 oz (105.4 kg)  10/14/18 240 lb (108.9 kg)    Physical Exam Vitals signs reviewed.  HENT:     Head: Normocephalic and atraumatic.  Eyes:     Pupils: Pupils are equal, round, and reactive to light.  Neck:     Musculoskeletal: Normal range of motion.  Cardiovascular:     Comments: Cardiac exam shows an irregular rate and irregular rhythm consistent with atrial fibrillation.  The rate is fairly well controlled.  I do not detect any murmurs, rubs or bruits. Pulmonary:     Effort: Pulmonary effort is normal.     Breath sounds: Normal breath sounds.  Abdominal:     General: Bowel sounds are normal.     Palpations: Abdomen is soft.  Musculoskeletal: Normal range of motion.        General: No tenderness or deformity.   Lymphadenopathy:     Cervical: No cervical adenopathy.  Skin:    General: Skin is warm and dry.     Findings: No erythema or rash.  Neurological:     Mental Status: He is alert and oriented to person, place, and time.  Psychiatric:        Behavior: Behavior normal.        Thought Content: Thought content normal.        Judgment: Judgment normal.      Lab Results  Component Value Date   WBC 6.4 03/19/2019  HGB 11.7 (L) 03/19/2019   HCT 36.0 (L) 03/19/2019   MCV 94.0 03/19/2019   PLT 159 03/19/2019   Lab Results  Component Value Date   FERRITIN 1,669 (H) 10/08/2018   IRON 9 (L) 10/08/2018   TIBC 176 (L) 10/08/2018   UIBC 167 10/08/2018   IRONPCTSAT 5 (L) 10/08/2018   Lab Results  Component Value Date   RETICCTPCT 3.7 (H) 09/18/2018   RBC 3.83 (L) 03/19/2019   No results found for: KPAFRELGTCHN, LAMBDASER, KAPLAMBRATIO No results found for: Kandis Cocking, IGMSERUM No results found for: [name], SPEI   Chemistry      Component Value Date/Time   NA 140 03/19/2019 0859   NA 137 12/29/2018 1241   NA 141 02/21/2017 0902   K 4.2 03/19/2019 0859   K 4.1 02/21/2017 0902   CL 103 03/19/2019 0859   CL 98 02/21/2017 0902   CO2 28 03/19/2019 0859   CO2 31 02/21/2017 0902   BUN 33 (H) 03/19/2019 0859   BUN 36 (H) 12/29/2018 1241   BUN 17 02/21/2017 0902   CREATININE 2.05 (H) 03/19/2019 0859   CREATININE 1.2 02/21/2017 0902      Component Value Date/Time   CALCIUM 9.2 03/19/2019 0859   CALCIUM 9.2 02/21/2017 0902   ALKPHOS 62 03/19/2019 0859   ALKPHOS 65 02/21/2017 0902   AST 22 03/19/2019 0859   ALT 13 03/19/2019 0859   ALT 18 02/21/2017 0902   BILITOT 0.6 03/19/2019 0859       Impression and Plan: [name] Rose is a very pleasant 68 year old Caucasian gentleman with metastatic high grade bladder cancer.   Again, we will have to get started back on treatment.  He would like to have treatment.  He  thinks he can handle treatment.  We will have to give dosage reduction.  I think that ifosfamide/gemcitabine would be a reasonable protocol for him.  Again we will have to make a dosage reduction.  He already has a Port-A-Cath and so this we do not have to worry about.  I am just grateful that he is recovering.  He really had a tough time in the hospital.  This pneumonia and atrial fibrillation really did a number on him.  He is already shown a lot of coverage.  We have been dealing with this cancer now for about 2-1/2 years.  I must say that he is done very nicely so far.  We will plan to see him back when he starts his treatment in January.  I spent about an hour with he and his wife today.  We have not seen him for a while.  I want to make sure that we covered all of his concerns and try to get him back on treatment so that this cancer would not become an issue.     Volanda Napoleon, MD 12/7/20209:48 AM

## 2019-03-21 DIAGNOSIS — R2689 Other abnormalities of gait and mobility: Secondary | ICD-10-CM | POA: Diagnosis not present

## 2019-03-21 DIAGNOSIS — Z7409 Other reduced mobility: Secondary | ICD-10-CM | POA: Diagnosis not present

## 2019-03-21 DIAGNOSIS — G629 Polyneuropathy, unspecified: Secondary | ICD-10-CM | POA: Diagnosis not present

## 2019-03-21 DIAGNOSIS — R531 Weakness: Secondary | ICD-10-CM | POA: Diagnosis not present

## 2019-03-21 DIAGNOSIS — R5381 Other malaise: Secondary | ICD-10-CM | POA: Diagnosis not present

## 2019-03-27 DIAGNOSIS — Z7409 Other reduced mobility: Secondary | ICD-10-CM | POA: Diagnosis not present

## 2019-03-27 DIAGNOSIS — G629 Polyneuropathy, unspecified: Secondary | ICD-10-CM | POA: Diagnosis not present

## 2019-03-27 DIAGNOSIS — R5381 Other malaise: Secondary | ICD-10-CM | POA: Diagnosis not present

## 2019-03-27 DIAGNOSIS — R2689 Other abnormalities of gait and mobility: Secondary | ICD-10-CM | POA: Diagnosis not present

## 2019-03-27 DIAGNOSIS — R531 Weakness: Secondary | ICD-10-CM | POA: Diagnosis not present

## 2019-03-29 DIAGNOSIS — I480 Paroxysmal atrial fibrillation: Secondary | ICD-10-CM | POA: Diagnosis not present

## 2019-03-29 DIAGNOSIS — Z0000 Encounter for general adult medical examination without abnormal findings: Secondary | ICD-10-CM | POA: Diagnosis not present

## 2019-03-29 DIAGNOSIS — N183 Chronic kidney disease, stage 3 unspecified: Secondary | ICD-10-CM | POA: Diagnosis not present

## 2019-03-29 DIAGNOSIS — Z23 Encounter for immunization: Secondary | ICD-10-CM | POA: Diagnosis not present

## 2019-03-29 DIAGNOSIS — R2689 Other abnormalities of gait and mobility: Secondary | ICD-10-CM | POA: Diagnosis not present

## 2019-03-29 DIAGNOSIS — Z7409 Other reduced mobility: Secondary | ICD-10-CM | POA: Diagnosis not present

## 2019-03-29 DIAGNOSIS — R531 Weakness: Secondary | ICD-10-CM | POA: Diagnosis not present

## 2019-03-29 DIAGNOSIS — I10 Essential (primary) hypertension: Secondary | ICD-10-CM | POA: Diagnosis not present

## 2019-03-29 DIAGNOSIS — R7303 Prediabetes: Secondary | ICD-10-CM | POA: Diagnosis not present

## 2019-03-29 DIAGNOSIS — R5381 Other malaise: Secondary | ICD-10-CM | POA: Diagnosis not present

## 2019-03-29 DIAGNOSIS — G629 Polyneuropathy, unspecified: Secondary | ICD-10-CM | POA: Diagnosis not present

## 2019-03-29 DIAGNOSIS — E7800 Pure hypercholesterolemia, unspecified: Secondary | ICD-10-CM | POA: Diagnosis not present

## 2019-03-30 ENCOUNTER — Telehealth: Payer: Self-pay

## 2019-03-30 DIAGNOSIS — I480 Paroxysmal atrial fibrillation: Secondary | ICD-10-CM | POA: Diagnosis not present

## 2019-03-30 DIAGNOSIS — I10 Essential (primary) hypertension: Secondary | ICD-10-CM | POA: Diagnosis not present

## 2019-03-30 DIAGNOSIS — Z8546 Personal history of malignant neoplasm of prostate: Secondary | ICD-10-CM | POA: Diagnosis not present

## 2019-03-30 DIAGNOSIS — E7800 Pure hypercholesterolemia, unspecified: Secondary | ICD-10-CM | POA: Diagnosis not present

## 2019-03-30 NOTE — Telephone Encounter (Signed)
Call from pt Pharmacy, [facility], to inquire about patient [name] Rose XR medication. They do not have that version in stock but do have alternative in stock to the ordered Cardizem LA. The patients wife was insistent that they needed that brand name "[name] Rose" but I was able to confer with our onsite pharmD that pt has not clinical reason to take a specific brand.Marland Kitchen

## 2019-03-31 DIAGNOSIS — M6281 Muscle weakness (generalized): Secondary | ICD-10-CM | POA: Diagnosis not present

## 2019-04-02 DIAGNOSIS — R2689 Other abnormalities of gait and mobility: Secondary | ICD-10-CM | POA: Diagnosis not present

## 2019-04-02 DIAGNOSIS — R5381 Other malaise: Secondary | ICD-10-CM | POA: Diagnosis not present

## 2019-04-02 DIAGNOSIS — G629 Polyneuropathy, unspecified: Secondary | ICD-10-CM | POA: Diagnosis not present

## 2019-04-02 DIAGNOSIS — R531 Weakness: Secondary | ICD-10-CM | POA: Diagnosis not present

## 2019-04-02 DIAGNOSIS — Z7409 Other reduced mobility: Secondary | ICD-10-CM | POA: Diagnosis not present

## 2019-04-04 DIAGNOSIS — Z7409 Other reduced mobility: Secondary | ICD-10-CM | POA: Diagnosis not present

## 2019-04-04 DIAGNOSIS — R5381 Other malaise: Secondary | ICD-10-CM | POA: Diagnosis not present

## 2019-04-04 DIAGNOSIS — R531 Weakness: Secondary | ICD-10-CM | POA: Diagnosis not present

## 2019-04-04 DIAGNOSIS — R2689 Other abnormalities of gait and mobility: Secondary | ICD-10-CM | POA: Diagnosis not present

## 2019-04-04 DIAGNOSIS — G629 Polyneuropathy, unspecified: Secondary | ICD-10-CM | POA: Diagnosis not present

## 2019-04-09 DIAGNOSIS — R2689 Other abnormalities of gait and mobility: Secondary | ICD-10-CM | POA: Diagnosis not present

## 2019-04-09 DIAGNOSIS — Z7409 Other reduced mobility: Secondary | ICD-10-CM | POA: Diagnosis not present

## 2019-04-09 DIAGNOSIS — R531 Weakness: Secondary | ICD-10-CM | POA: Diagnosis not present

## 2019-04-09 DIAGNOSIS — R5381 Other malaise: Secondary | ICD-10-CM | POA: Diagnosis not present

## 2019-04-09 DIAGNOSIS — G629 Polyneuropathy, unspecified: Secondary | ICD-10-CM | POA: Diagnosis not present

## 2019-04-10 ENCOUNTER — Telehealth: Payer: Self-pay

## 2019-04-10 ENCOUNTER — Encounter: Payer: Self-pay | Admitting: Internal Medicine

## 2019-04-10 ENCOUNTER — Ambulatory Visit (INDEPENDENT_AMBULATORY_CARE_PROVIDER_SITE_OTHER): Payer: PPO | Admitting: Internal Medicine

## 2019-04-10 ENCOUNTER — Other Ambulatory Visit: Payer: Self-pay

## 2019-04-10 VITALS — BP 141/87 | HR 107 | Temp 97.5°F | Ht 74.0 in | Wt 267.0 lb

## 2019-04-10 DIAGNOSIS — C679 Malignant neoplasm of bladder, unspecified: Secondary | ICD-10-CM | POA: Diagnosis not present

## 2019-04-10 DIAGNOSIS — I480 Paroxysmal atrial fibrillation: Secondary | ICD-10-CM

## 2019-04-10 DIAGNOSIS — C772 Secondary and unspecified malignant neoplasm of intra-abdominal lymph nodes: Secondary | ICD-10-CM | POA: Diagnosis not present

## 2019-04-10 DIAGNOSIS — I4891 Unspecified atrial fibrillation: Secondary | ICD-10-CM

## 2019-04-10 MED ORDER — DILTIAZEM HCL ER COATED BEADS 360 MG PO CP24: 360.0000 mg | ORAL_CAPSULE | Freq: Every day | ORAL | 2 refills | Status: DC

## 2019-04-10 NOTE — Telephone Encounter (Signed)
Dear [name] are scheduled for a Cardioversion on 05/18/2019 with Dr. Stanford Breed.  Please arrive at the [facility] (Main Entrance A) at [facility]: [address] [place], [place] [address] at 09:00 am. (1 hour prior to procedure)  DIET: Nothing to eat or drink after midnight except a sip of water with medications (see medication instructions below)  Medication Instructions:  Continue your anticoagulant: Eliquis  You will need to continue your anticoagulant after your procedure until you are told by your Provider that it is safe to stop   Labs: CBC, BMET on 05/15/2019 Covid Screening at 10:40am on 05/15/2019 at [place] must have a responsible person to drive you home and stay in the waiting area during your procedure. Failure to do so could result in cancellation.  Bring your insurance cards.  *Special Note: Every effort is made to have your procedure done on time. Occasionally there are emergencies that occur at the hospital that may cause delays. Please be patient if a delay does occur.   Patient's wife made aware, verbalized understanding.

## 2019-04-10 NOTE — Patient Instructions (Signed)
Medication Instructions:  START TAKING ELIQUIS $RemoveBefore'5MG'JoUiwNiQREgwo$  TWICE A DAY INCREASE DILTIAZEM TO $RemoveBefo'360MG'mkQcGizbQaQ$  DAILY *If you need a refill on your cardiac medications before your next appointment, please call your pharmacy*  Testing/Procedures: Call back, ask for [name] Rose to schedule cardioversion. Dear [name] will call to schedule a Cardioversion.  Please arrive at the [facility] (Main Entrance A) at [facility]: [address] [place], [place] [address]. (Arrive 1 hour prior to procedure)  DIET: Nothing to eat or drink after midnight except a sip of water with medications (see medication instructions below)  Medication Instructions:  Continue your anticoagulant: Eliquis - IF YOU MISS A DOSE PLEASE CALL THE OFFICE IMMEDIATELY  You will need to continue your anticoagulant after your procedure until you are told by your provider that it is safe to stop.   Labs:  You will need a CBC, BMP, and COVID test prior to your procedure. We will call you with the date once the cardioversion has been scheduled.    You must have a responsible person to drive you home and stay in the waiting area during your procedure. Failure to do so could result in cancellation.  Bring your insurance cards.  *Special Note: Every effort is made to have your procedure done on time. Occasionally there are emergencies that occur at the hospital that may cause delays. Please be patient if a delay does occur.    Follow-Up: At [facility], you and your health needs are our priority.  As part of our continuing mission to provide you with exceptional heart care, we have created designated Provider Care Teams.  These Care Teams include your primary Cardiologist (physician) and Advanced Practice Providers (APPs -  Physician Assistants and Nurse Practitioners) who all work together to provide you with the care you need, when you need it.  Your next appointment:   We will call to set up a follow up appointment after your  cardioversion.

## 2019-04-10 NOTE — Progress Notes (Signed)
OFFICE NOTE  Chief Complaint:  Follow-up A. fib  Primary Care Physician: Shirline Frees, MD  HPI:  [name] Rose is a 68 y.o. male with a past medial history significant for bladder cancer which is metastatic, recent admission for sepsis and MSSA bacteremia.  He presents today to establish care with me as a new patient.  Initially he was seen for A. fib with RVR by Dr. Einar Gip in the hospital.  It was recommended that he go on to metoprolol and diltiazem for rate control, but rhythm control was not pursued as he could not be anticoagulated due to anemia and thrombocytopenia (a result of chemotherapy).  During that admission he was seen by infectious diseases who recommended trans-esophageal echocardiogram given the fact that he has a chemotherapy port to rule out endocarditis.  This was negative for endocarditis and did show moderate mitral regurgitation and no left atrial appendage thrombus.  He wished to follow-up with [place] rather than Dr. Einar Gip to keep his care providers in the same system.  Today he has no complaints.  He denies any worsening shortness of breath or chest pain.  He denies any palpitations.  His EKG shows sinus rhythm.  Recent labs from a few days ago showed that he remains anemic with a hemoglobin of 8.7 and platelet count of 85,000.   04/10/2019  [name] Rose is seen today for follow-up.  Over the summer he had a number of issues including some worsening heart failure as well as A. fib with RVR.  Recently he saw Roby Lofts, PA-C, who consolidated his A. fib medications including the diltiazem for better rate control.  Today presents in persistent A. fib with RVR at 110.  Blood pressure is elevated.  He saw his oncologist recently and lab work shows some improvement in his anemia and thrombocytopenia and he was advised that it was okay to start on Eliquis.  Due to some confusion however he did not start that medication 3 weeks ago as recommended.  PMHx:  Past  Medical History:  Diagnosis Date  . Arthritis   . 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 ([place]) 09/29/2016  . Bladder tumor   . Essential hypertension 06/23/2018  . Goals of care, counseling/discussion 09/30/2016  . History of prostate cancer followed by pcp dr [name]-  per pt last PSA undetectable   dx 2008-- (Stage T1c, Gleason 3+3,  PSA 4.58, vol 99cc)  s/p  radical prostatectomy (nerve sparing bilateral)   . Hyperglycemia 06/23/2018  . Hypertension   . Lower urinary tract symptoms (LUTS)   . Mild hyperlipidemia 06/23/2018  . Pre-diabetes   . Wears glasses     Past Surgical History:  Procedure Laterality Date  . CATARACT EXTRACTION W/ INTRAOCULAR LENS  IMPLANT, BILATERAL Bilateral 2011  . [place]  . IR FLUORO GUIDE PORT INSERTION RIGHT  10/07/2016  . IR RADIOLOGIST EVAL & MGMT  01/05/2018  . IR US GUIDE VASC ACCESS RIGHT  10/07/2016  . KNEE ARTHROSCOPY Bilateral right 2006;  left 02-15-2007  . [place];  1990;  1983  . ROBOT ASSISTED LAPAROSCOPIC RADICAL PROSTATECTOMY  06/20/2006   bilateral nerve sparing  . TEE WITHOUT CARDIOVERSION N/A 06/27/2018   Procedure: TRANSESOPHAGEAL ECHOCARDIOGRAM (TEE);  Surgeon: Nigel Mormon, MD;  Location: [facility] ENDOSCOPY;  Service: Cardiovascular;  Laterality: N/A;  . TOTAL HIP ARTHROPLASTY Left 03/03/2015   Procedure: LEFT TOTAL HIP ARTHROPLASTY ANTERIOR APPROACH;  Surgeon: Dorna Leitz, MD;  Location:  [place] OR;  Service: Orthopedics;  Laterality: Left;  . TOTAL KNEE ARTHROPLASTY Bilateral left 08-13-2009;  right 12-26-2009  . TRANSURETHRAL RESECTION OF BLADDER TUMOR N/A 09/20/2016   Procedure: TRANSURETHRAL RESECTION OF BLADDER TUMOR (TURBT);  Surgeon: Franchot Gallo, MD;  Location: [facility];  Service: Urology;  Laterality: N/A;    FAMHx:  Family History  Problem Relation Age of Onset  . Hypertension Mother   . Aneurysm Mother   . Emphysema Father   . Hypertension  Father     SOCHx:   reports that he quit smoking about 34 years ago. His smoking use included cigarettes. He quit after 16.00 years of use. He has never used smokeless tobacco. He reports current alcohol use. He reports that he does not use drugs.  ALLERGIES:  No Known Allergies  ROS: A comprehensive review of systems was negative.  HOME MEDS: Current Outpatient Medications on File Prior to Visit  Medication Sig Dispense Refill  . atorvastatin (LIPITOR) 40 MG tablet Take 40 mg by mouth daily.    . Blood Glucose Monitoring Suppl ([place]) w/Device KIT See admin instructions.    . gabapentin (NEURONTIN) 400 MG capsule TAKE 1 CAPSULE (400 MG TOTAL) BY MOUTH 4 (FOUR) TIMES DAILY. 360 capsule 2  . Hydrocortisone (GERHARDT'S BUTT CREAM) CREA Apply 1 application topically 2 (two) times daily. 1 each 0  . magnesium oxide (MAG-OX) 400 MG tablet TAKE 1 TABLET BY MOUTH TWICE A DAY FOR SUPPLEMENT    . metoprolol succinate (TOPROL-XL) 25 MG 24 hr tablet Take 25 mg by mouth at bedtime.    . metoprolol tartrate (LOPRESSOR) 25 MG tablet TAKE 1 TABLET BY MOUTH EVERY 6 HOURS AS NEEDED FOR HIGH HEART RATE (HR 100)    . OneTouch Delica Lancets 77L MISC 2 (two) times daily. as directed    . pyridoxine (B-6) 100 MG tablet Take 100 mg by mouth daily.    Marland Kitchen apixaban (ELIQUIS) 5 MG TABS tablet Take 1 tablet (5 mg total) by mouth 2 (two) times daily. (Patient not taking: Reported on 04/10/2019) 60 tablet 6  . ONETOUCH VERIO test strip 2 (two) times daily. for testing    . Potassium Chloride ER 20 MEQ TBCR TAKE 2 TABLETS BY MOUTH EVERY DAY FOR SUPPLEMENT    . traMADol (ULTRAM) 50 MG tablet Take 50 mg by mouth daily as needed.    . Vitamin D, Ergocalciferol, (DRISDOL) 1.25 MG (50000 UT) CAPS capsule TAKE 1 CAPSULE BY MOUTH EVERY SATURDAYS FOR SUPPLEMENT FOR 8 WEEKS     Current Facility-Administered Medications on File Prior to Visit  Medication Dose Route Frequency Provider Last Rate Last Admin    . sodium chloride flush (NS) 0.9 % injection 10 mL  10 mL Intravenous PRN [place], Holli Humbles, NP   10 mL at 02/21/17 1038  . sodium chloride flush (NS) 0.9 % injection 10 mL  10 mL Intravenous PRN Volanda Napoleon, MD   10 mL at 07/10/18 0844    LABS/IMAGING: No results found for this or any previous visit (from the past 48 hour(s)). No results found.  LIPID PANEL:    Component Value Date/Time   CHOL 159 10/05/2018 0411   CHOL 163 10/11/2016 0749   TRIG 131 10/05/2018 0411   TRIG 116 10/11/2016 0749   HDL 47 10/05/2018 0411   HDL 39 (L) 10/11/2016 0749   CHOLHDL 3.4 10/05/2018 0411   VLDL 26 10/05/2018 0411   LDLCALC 86 10/05/2018 0411  LDLCALC 101 (H) 10/11/2016 0749     WEIGHTS: Wt Readings from Last 3 Encounters:  04/10/19 267 lb (121.1 kg)  03/19/19 258 lb (117 kg)  12/29/18 232 lb 6.4 oz (105.4 kg)    VITALS: BP (!) 141/87   Pulse (!) 107   Temp (!) 97.5 F (36.4 C)   Ht _0  (1.88 m)   Wt 267 lb (121.1 kg)   SpO2 94%   BMI 34.28 kg/m   EXAM: General appearance: alert and no distress Neck: no carotid bruit, no JVD and thyroid not enlarged, symmetric, no tenderness/mass/nodules Lungs: clear to auscultation bilaterally Heart: irregularly irregular rhythm, S1, S2 normal and systolic murmur: early systolic 2/6, blowing at apex Abdomen: soft, non-tender; bowel sounds normal; no masses,  no organomegaly Extremities: extremities normal, atraumatic, no cyanosis or edema Pulses: 2+ and symmetric Skin: Skin color, texture, turgor normal. No rashes or lesions Neurologic: Grossly normal Psych: Pleasant  EKG: A. fib with RVR at 110-personally reviewed  ASSESSMENT: 1. Paroxysmal atrial fibrillation-CHADSVASC score 3, not on anticoagulation due to anemia and thrombocytopenia 2. Metastatic bladder cancer 3. Anemia and thrombocytopenia related to chemotherapy 4. Recent MSSA sepsis without evidence of endocarditis or left atrial appendage thrombus 5. Normal LV  function with moderate mitral regurgitation (06/2018)  PLAN: 1.   [name] Rose remains in A. fib with RVR.  He needs better rate control and plan to increase his diltiazem further to 360 mg daily.  He has not started Eliquis as recommended by his oncologist on March 19, 2019.  I advised starting 5 mg twice daily of Eliquis and he will need a minimum of 3 weeks of anticoagulation before considering a cardioversion which we will try to arrange at the end of January.  Plan follow-up with me afterwards.  Pixie Casino, MD, [facility], [place] Director of the Advanced Lipid Disorders &  Cardiovascular Risk Reduction Clinic Diplomate of the American Board of Clinical Lipidology Attending Cardiologist  Direct Dial: [phone]  Fax: [phone]  Website:  [email or web address] Madicyn Mesina 04/10/2019, 4:40 PM

## 2019-04-11 DIAGNOSIS — R531 Weakness: Secondary | ICD-10-CM | POA: Diagnosis not present

## 2019-04-11 DIAGNOSIS — R5381 Other malaise: Secondary | ICD-10-CM | POA: Diagnosis not present

## 2019-04-11 DIAGNOSIS — G629 Polyneuropathy, unspecified: Secondary | ICD-10-CM | POA: Diagnosis not present

## 2019-04-11 DIAGNOSIS — R2689 Other abnormalities of gait and mobility: Secondary | ICD-10-CM | POA: Diagnosis not present

## 2019-04-11 DIAGNOSIS — Z7409 Other reduced mobility: Secondary | ICD-10-CM | POA: Diagnosis not present

## 2019-04-16 ENCOUNTER — Other Ambulatory Visit (INDEPENDENT_AMBULATORY_CARE_PROVIDER_SITE_OTHER): Payer: PPO

## 2019-04-16 DIAGNOSIS — R531 Weakness: Secondary | ICD-10-CM | POA: Diagnosis not present

## 2019-04-16 DIAGNOSIS — I340 Nonrheumatic mitral (valve) insufficiency: Secondary | ICD-10-CM

## 2019-04-16 DIAGNOSIS — G629 Polyneuropathy, unspecified: Secondary | ICD-10-CM | POA: Diagnosis not present

## 2019-04-16 DIAGNOSIS — I10 Essential (primary) hypertension: Secondary | ICD-10-CM | POA: Diagnosis not present

## 2019-04-16 DIAGNOSIS — I482 Chronic atrial fibrillation, unspecified: Secondary | ICD-10-CM | POA: Diagnosis not present

## 2019-04-16 DIAGNOSIS — R5381 Other malaise: Secondary | ICD-10-CM | POA: Diagnosis not present

## 2019-04-16 DIAGNOSIS — Z7409 Other reduced mobility: Secondary | ICD-10-CM | POA: Diagnosis not present

## 2019-04-16 DIAGNOSIS — R2689 Other abnormalities of gait and mobility: Secondary | ICD-10-CM | POA: Diagnosis not present

## 2019-04-16 DIAGNOSIS — I4891 Unspecified atrial fibrillation: Secondary | ICD-10-CM | POA: Diagnosis not present

## 2019-04-16 DIAGNOSIS — I480 Paroxysmal atrial fibrillation: Secondary | ICD-10-CM | POA: Diagnosis not present

## 2019-04-16 DIAGNOSIS — E782 Mixed hyperlipidemia: Secondary | ICD-10-CM

## 2019-04-17 ENCOUNTER — Other Ambulatory Visit: Payer: Self-pay | Admitting: *Deleted

## 2019-04-17 ENCOUNTER — Inpatient Hospital Stay: Payer: PPO | Attending: Hematology & Oncology

## 2019-04-17 ENCOUNTER — Other Ambulatory Visit: Payer: Self-pay

## 2019-04-17 ENCOUNTER — Inpatient Hospital Stay: Payer: PPO

## 2019-04-17 ENCOUNTER — Inpatient Hospital Stay (HOSPITAL_BASED_OUTPATIENT_CLINIC_OR_DEPARTMENT_OTHER): Payer: PPO | Admitting: Hematology & Oncology

## 2019-04-17 ENCOUNTER — Encounter: Payer: Self-pay | Admitting: Hematology & Oncology

## 2019-04-17 VITALS — BP 134/69 | HR 76 | Temp 97.9°F | Resp 18 | Wt 271.2 lb

## 2019-04-17 DIAGNOSIS — C772 Secondary and unspecified malignant neoplasm of intra-abdominal lymph nodes: Secondary | ICD-10-CM

## 2019-04-17 DIAGNOSIS — Z7952 Long term (current) use of systemic steroids: Secondary | ICD-10-CM | POA: Diagnosis not present

## 2019-04-17 DIAGNOSIS — Z7901 Long term (current) use of anticoagulants: Secondary | ICD-10-CM | POA: Insufficient documentation

## 2019-04-17 DIAGNOSIS — C679 Malignant neoplasm of bladder, unspecified: Secondary | ICD-10-CM

## 2019-04-17 DIAGNOSIS — G629 Polyneuropathy, unspecified: Secondary | ICD-10-CM | POA: Insufficient documentation

## 2019-04-17 DIAGNOSIS — C671 Malignant neoplasm of dome of bladder: Secondary | ICD-10-CM

## 2019-04-17 DIAGNOSIS — K5780 Diverticulitis of intestine, part unspecified, with perforation and abscess without bleeding: Secondary | ICD-10-CM | POA: Diagnosis not present

## 2019-04-17 DIAGNOSIS — Z7689 Persons encountering health services in other specified circumstances: Secondary | ICD-10-CM | POA: Insufficient documentation

## 2019-04-17 DIAGNOSIS — Z79899 Other long term (current) drug therapy: Secondary | ICD-10-CM | POA: Diagnosis not present

## 2019-04-17 DIAGNOSIS — Z5111 Encounter for antineoplastic chemotherapy: Secondary | ICD-10-CM | POA: Diagnosis not present

## 2019-04-17 DIAGNOSIS — I4891 Unspecified atrial fibrillation: Secondary | ICD-10-CM | POA: Insufficient documentation

## 2019-04-17 LAB — CMP (CANCER CENTER ONLY)
ALT: 13 U/L (ref 0–44)
AST: 23 U/L (ref 15–41)
Albumin: 3.8 g/dL (ref 3.5–5.0)
Alkaline Phosphatase: 57 U/L (ref 38–126)
Anion gap: 7 (ref 5–15)
BUN: 34 mg/dL — ABNORMAL HIGH (ref 8–23)
CO2: 28 mmol/L (ref 22–32)
Calcium: 9.3 mg/dL (ref 8.9–10.3)
Chloride: 102 mmol/L (ref 98–111)
Creatinine: 2 mg/dL — ABNORMAL HIGH (ref 0.61–1.24)
GFR, Est AFR Am: 39 mL/min — ABNORMAL LOW (ref >60)
GFR, Estimated: 34 mL/min — ABNORMAL LOW (ref >60)
Glucose, Bld: 143 mg/dL — ABNORMAL HIGH (ref 70–99)
Potassium: 4.2 mmol/L (ref 3.5–5.1)
Sodium: 137 mmol/L (ref 135–145)
Total Bilirubin: 0.7 mg/dL (ref 0.3–1.2)
Total Protein: 6.6 g/dL (ref 6.5–8.1)

## 2019-04-17 LAB — CBC WITH DIFFERENTIAL (CANCER CENTER ONLY)
Abs Immature Granulocytes: 0.01 10*3/uL (ref 0.00–0.07)
Basophils Absolute: 0 10*3/uL (ref 0.0–0.1)
Basophils Relative: 0 %
Eosinophils Absolute: 0.1 10*3/uL (ref 0.0–0.5)
Eosinophils Relative: 1 %
HCT: 34.8 % — ABNORMAL LOW (ref 39.0–52.0)
Hemoglobin: 11.2 g/dL — ABNORMAL LOW (ref 13.0–17.0)
Immature Granulocytes: 0 %
Lymphocytes Relative: 22 %
Lymphs Abs: 1.5 10*3/uL (ref 0.7–4.0)
MCH: 30.4 pg (ref 26.0–34.0)
MCHC: 32.2 g/dL (ref 30.0–36.0)
MCV: 94.6 fL (ref 80.0–100.0)
Monocytes Absolute: 0.7 10*3/uL (ref 0.1–1.0)
Monocytes Relative: 10 %
Neutro Abs: 4.7 10*3/uL (ref 1.7–7.7)
Neutrophils Relative %: 67 %
Platelet Count: 139 10*3/uL — ABNORMAL LOW (ref 150–400)
RBC: 3.68 MIL/uL — ABNORMAL LOW (ref 4.22–5.81)
RDW: 13.7 % (ref 11.5–15.5)
WBC Count: 7 10*3/uL (ref 4.0–10.5)
nRBC: 0 % (ref 0.0–0.2)

## 2019-04-17 LAB — URINALYSIS, COMPLETE (UACMP) WITH MICROSCOPIC
Bilirubin Urine: NEGATIVE
Glucose, UA: NEGATIVE mg/dL
Ketones, ur: NEGATIVE mg/dL
Nitrite: NEGATIVE
Protein, ur: 100 mg/dL — AB
RBC / HPF: >50 RBC/hpf (ref 0–5)
Specific Gravity, Urine: >1.03 — ABNORMAL HIGH (ref 1.005–1.030)
pH: 5.5 (ref 5.0–8.0)

## 2019-04-17 MED ORDER — ONDANSETRON HCL 8 MG PO TABS: 8.0000 mg | ORAL_TABLET | Freq: Two times a day (BID) | ORAL | 1 refills | Status: DC | PRN

## 2019-04-17 MED ORDER — HEPARIN SOD (PORK) LOCK FLUSH 100 UNIT/ML IV SOLN
500.0000 [IU] | Freq: Once | INTRAVENOUS | Status: AC | PRN
  Administered 2019-04-17: 15:00:00 500 [IU]
  Filled 2019-04-17: qty 5

## 2019-04-17 MED ORDER — DEXAMETHASONE 4 MG PO TABS: 8.0000 mg | ORAL_TABLET | Freq: Every day | ORAL | 1 refills | Status: DC

## 2019-04-17 MED ORDER — AMOXICILLIN 500 MG PO TABS: 2000.0000 mg | ORAL_TABLET | Freq: Once | ORAL | 0 refills | Status: DC

## 2019-04-17 MED ORDER — SODIUM CHLORIDE 0.9 % IV SOLN
Freq: Once | INTRAVENOUS | Status: AC
  Filled 2019-04-17: qty 250

## 2019-04-17 MED ORDER — PALONOSETRON HCL INJECTION 0.25 MG/5ML
0.2500 mg | Freq: Once | INTRAVENOUS | Status: AC
  Administered 2019-04-17: 0.25 mg via INTRAVENOUS

## 2019-04-17 MED ORDER — SODIUM CHLORIDE 0.9 % IV SOLN
500.0000 mg/m2 | Freq: Once | INTRAVENOUS | Status: AC
  Administered 2019-04-17: 1254 mg via INTRAVENOUS
  Filled 2019-04-17: qty 26.3

## 2019-04-17 MED ORDER — DEXAMETHASONE SODIUM PHOSPHATE 10 MG/ML IJ SOLN
10.0000 mg | Freq: Once | INTRAMUSCULAR | Status: AC
  Administered 2019-04-17: 10:00:00 10 mg via INTRAVENOUS

## 2019-04-17 MED ORDER — HOT PACK MISC ONCOLOGY
1.0000 | Freq: Once | Status: DC | PRN
  Filled 2019-04-17: qty 1

## 2019-04-17 MED ORDER — LORAZEPAM 0.5 MG PO TABS: 0.5000 mg | ORAL_TABLET | Freq: Four times a day (QID) | ORAL | 0 refills | Status: DC | PRN

## 2019-04-17 MED ORDER — PALONOSETRON HCL INJECTION 0.25 MG/5ML
INTRAVENOUS | Status: AC
  Filled 2019-04-17: qty 5

## 2019-04-17 MED ORDER — SODIUM CHLORIDE 0.9% FLUSH
10.0000 mL | INTRAVENOUS | Status: DC | PRN
  Administered 2019-04-17 (×2): 10 mL
  Filled 2019-04-17: qty 10

## 2019-04-17 MED ORDER — SODIUM CHLORIDE 0.9 % IV SOLN
INTRAVENOUS | Status: DC
  Filled 2019-04-17 (×2): qty 250

## 2019-04-17 MED ORDER — DEXAMETHASONE SODIUM PHOSPHATE 10 MG/ML IJ SOLN
INTRAMUSCULAR | Status: AC
  Filled 2019-04-17: qty 1

## 2019-04-17 MED ORDER — SODIUM CHLORIDE 0.9 % IV SOLN
400.0000 mg/m2 | Freq: Once | INTRAVENOUS | Status: AC
  Administered 2019-04-17: 1000 mg via INTRAVENOUS
  Filled 2019-04-17: qty 10

## 2019-04-17 MED ORDER — SODIUM CHLORIDE 0.9 % IV SOLN
Freq: Once | INTRAVENOUS | Status: AC
  Filled 2019-04-17: qty 54

## 2019-04-17 MED ORDER — PROCHLORPERAZINE MALEATE 10 MG PO TABS: 10.0000 mg | ORAL_TABLET | Freq: Four times a day (QID) | ORAL | 1 refills | Status: DC | PRN

## 2019-04-17 NOTE — Patient Instructions (Addendum)
[place] Discharge Instructions for Patients Receiving Chemotherapy  Today you received the following chemotherapy agents Ifosphamide, Gemzar  To help prevent nausea and vomiting after your treatment, we encourage you to take your nausea medication   1) Beginning Friday take Dexamethasone (Decadron) 2 tablets by mouth daily.  Take this for 2 days.  Take in the morning and take with breakfast  2) At any time, if you experience nausea you can take Ativan (Lorazepam) or Prochlorperazine (Compazine)  by mouth every 6 hours as needed for nausea or vomiting.  3) Beginning Friday, if you experience nausea, you can take Ondansetron (Zofran) twice daily as needed for nausea or vomiting.  Can still take Ativan or Compazine for nausea if needed.   3)    If you develop nausea and vomiting that is not controlled by your nausea medication, call the clinic.   BELOW ARE SYMPTOMS THAT SHOULD BE REPORTED IMMEDIATELY:  *FEVER GREATER THAN 100.5 F  *CHILLS WITH OR WITHOUT FEVER  NAUSEA AND VOMITING THAT IS NOT CONTROLLED WITH YOUR NAUSEA MEDICATION  *UNUSUAL SHORTNESS OF BREATH  *UNUSUAL BRUISING OR BLEEDING  TENDERNESS IN MOUTH AND THROAT WITH OR WITHOUT PRESENCE OF ULCERS  *URINARY PROBLEMS  *BOWEL PROBLEMS  UNUSUAL RASH Items with * indicate a potential emergency and should be followed up as soon as possible.  Feel free to call the clinic should you have any questions or concerns. The clinic phone number is [phone].  Please show the [place] at check-in to the Emergency Department and triage nurse.

## 2019-04-17 NOTE — Patient Instructions (Signed)

## 2019-04-17 NOTE — Progress Notes (Signed)
Hematology and Oncology Follow Up Visit  [name] Rose [id number] 02/01/1951 68 y.o. 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 Taxotere 80mg/m2 IV q 3 wks - s/p cycle #3 - d/c due to progression  Current Therapy:   M-VAC - s/p cycle #8  -- d/c on 07/10/2018 due to blood counts Padcev -- s/p cycle #1 on 07/17/2018 Ifosfamide/Gemzar -- cycle #1 to start 04/16/2019   Interim History: [name] Rose is here today for the start of chemotherapy.  He is doing okay.  He had a nice Christmas and New Year's holiday.  He has had no problems outside of occasional hematuria.  There is no dysuria.  He has had no problems with his bowels.  He still has some neuropathy.  He is getting around a little bit better.  His weight is up quite a bit.  He has been eating quite well.  This makes him a little bit happy.  I think it is certainly a good sign.  He has had no problems with fever.  He has had no cough.  He has had no nausea or vomiting.  There is been no leg swelling.  We will go ahead and do a urinalysis on him to make sure that there is no urine infection.  We are going to start him on Gemzar/ifosfamide today.  We will make some dosage adjustments.  I am not going to put in carboplatinum as I worry that this might worsen his neuropathy.  He is on Eliquis for the atrial fibrillation.  He is doing okay on this.  This might be contributing to the occasional hematuria.  Overall, I would have to say that his performance status is ECOG 1.    Medications:  Allergies as of 04/17/2019   No Known Allergies     Medication List       Accurate as of April 17, 2019 12:35 PM. If you have any questions, ask your nurse or doctor.        amoxicillin 500 MG tablet Commonly known as: AMOXIL Take 4 tablets (2,000 mg total) by mouth once for  1 dose. Take 1 hour prior to dental procedure Started by:  R , MD   apixaban 5 MG Tabs tablet Commonly known as: Eliquis Take 1 tablet (5 mg total) by mouth 2 (two) times daily.   atorvastatin 40 MG tablet Commonly known as: LIPITOR Take 40 mg by mouth daily.   dexamethasone 4 MG tablet Commonly known as: DECADRON Take 2 tablets (8 mg total) by mouth daily. Start the day after chemotherapy for 2 days. Take with food.   diltiazem 360 MG 24 hr capsule Commonly known as: Cardizem CD Take 1 capsule (360 mg total) by mouth daily.   gabapentin 400 MG capsule Commonly known as: NEURONTIN TAKE 1 CAPSULE (400 MG TOTAL) BY MOUTH 4 (FOUR) TIMES DAILY.   Gerhardt's butt cream Crea Apply 1 application topically 2 (two) times daily.   LORazepam 0.5 MG tablet Commonly known as: Ativan Take 1 tablet (0.5 mg total) by mouth every 6 (six) hours as needed (Nausea or vomiting).   magnesium oxide 400 MG tablet Commonly known as: MAG-OX TAKE 1 TABLET BY MOUTH TWICE A DAY FOR SUPPLEMENT   metoprolol succinate 25 MG 24 hr tablet Commonly known as: TOPROL-XL Take 25 mg by mouth at bedtime.     metoprolol tartrate 25 MG tablet Commonly known as: LOPRESSOR TAKE 1 TABLET BY MOUTH EVERY 6 HOURS AS NEEDED FOR HIGH HEART RATE (HR 100)   ondansetron 8 MG tablet Commonly known as: Zofran Take 1 tablet (8 mg total) by mouth 2 (two) times daily as needed for refractory nausea / vomiting. Start 2 days after chemotherapy.   OneTouch Delica Lancets 33G Misc 2 (two) times daily. as directed   OneTouch Verio Flex System w/Device Kit See admin instructions.   OneTouch Verio test strip Generic drug: glucose blood 2 (two) times daily. for testing   Potassium Chloride ER 20 MEQ Tbcr TAKE 2 TABLETS BY MOUTH EVERY DAY FOR SUPPLEMENT   prochlorperazine 10 MG tablet Commonly known as: COMPAZINE Take 1 tablet (10 mg total) by mouth every 6 (six) hours as needed (Nausea or vomiting).     pyridoxine 100 MG tablet Commonly known as: B-6 Take 100 mg by mouth daily.   traMADol 50 MG tablet Commonly known as: ULTRAM Take 50 mg by mouth daily as needed.   Vitamin D (Ergocalciferol) 1.25 MG (50000 UT) Caps capsule Commonly known as: DRISDOL TAKE 1 CAPSULE BY MOUTH EVERY SATURDAYS FOR SUPPLEMENT FOR 8 WEEKS       Allergies: No Known Allergies  Past Medical History, Surgical history, Social history, and Family History were reviewed and updated.  Review of Systems: Review of Systems  Constitutional: Negative.   HENT: Negative.   Eyes: Negative.   Respiratory: Negative.   Cardiovascular: Negative.   Gastrointestinal: Negative.   Genitourinary: Negative.   Musculoskeletal: Negative.   Skin: Negative.   Neurological: Positive for tingling.  Endo/Heme/Allergies: Negative.   Psychiatric/Behavioral: Negative.      Physical Exam:  weight is 271 lb 4 oz (123 kg). His temporal temperature is 97.9 F (36.6 C). His blood pressure is 134/69 and his pulse is 76. His respiration is 18 and oxygen saturation is 95%.   Wt Readings from Last 3 Encounters:  04/17/19 271 lb 4 oz (123 kg)  04/10/19 267 lb (121.1 kg)  03/19/19 258 lb (117 kg)    Physical Exam Vitals reviewed.  HENT:     Head: Normocephalic and atraumatic.  Eyes:     Pupils: Pupils are equal, round, and reactive to light.  Cardiovascular:     Comments: Cardiac exam shows an irregular rate and irregular rhythm consistent with atrial fibrillation.  The rate is fairly well controlled.  I do not detect any murmurs, rubs or bruits. Pulmonary:     Effort: Pulmonary effort is normal.     Breath sounds: Normal breath sounds.  Abdominal:     General: Bowel sounds are normal.     Palpations: Abdomen is soft.  Musculoskeletal:        General: No tenderness or deformity. Normal range of motion.     Cervical back: Normal range of motion.  Lymphadenopathy:     Cervical: No cervical adenopathy.  Skin:    General:  Skin is warm and dry.     Findings: No erythema or rash.  Neurological:     Mental Status: He is alert and oriented to person, place, and time.  Psychiatric:        Behavior: Behavior normal.        Thought Content: Thought content normal.        Judgment: Judgment normal.      Lab Results  Component Value Date   WBC 7.0 04/17/2019   HGB 11.2 (L) 04/17/2019   HCT   34.8 (L) 04/17/2019   MCV 94.6 04/17/2019   PLT 139 (L) 04/17/2019   Lab Results  Component Value Date   FERRITIN 1,669 (H) 10/08/2018   IRON 9 (L) 10/08/2018   TIBC 176 (L) 10/08/2018   UIBC 167 10/08/2018   IRONPCTSAT 5 (L) 10/08/2018   Lab Results  Component Value Date   RETICCTPCT 3.7 (H) 09/18/2018   RBC 3.68 (L) 04/17/2019   No results found for: KPAFRELGTCHN, LAMBDASER, KAPLAMBRATIO No results found for: Kandis Cocking, IGMSERUM No results found for: [name], SPEI   Chemistry      Component Value Date/Time   NA 137 04/17/2019 0857   NA 137 12/29/2018 1241   NA 141 02/21/2017 0902   K 4.2 04/17/2019 0857   K 4.1 02/21/2017 0902   CL 102 04/17/2019 0857   CL 98 02/21/2017 0902   CO2 28 04/17/2019 0857   CO2 31 02/21/2017 0902   BUN 34 (H) 04/17/2019 0857   BUN 36 (H) 12/29/2018 1241   BUN 17 02/21/2017 0902   CREATININE 2.00 (H) 04/17/2019 0857   CREATININE 1.2 02/21/2017 0902      Component Value Date/Time   CALCIUM 9.3 04/17/2019 0857   CALCIUM 9.2 02/21/2017 0902   ALKPHOS 57 04/17/2019 0857   ALKPHOS 65 02/21/2017 0902   AST 23 04/17/2019 0857   ALT 13 04/17/2019 0857   ALT 18 02/21/2017 0902   BILITOT 0.7 04/17/2019 0857       Impression and Plan: Mr. Shuey is a very pleasant 69 year old Caucasian gentleman with metastatic high grade bladder cancer.   Hopefully, he will have a response to treatment.  I know this is his third or fourth line of treatment.  He still has a pretty decent performance status.  Again we are  going to make some dosage adjustments.   He will get Neulasta after treatment.  I will probably do 2 or 3 cycles of treatment and then we will repeat his PET scan and see how everything looks.  Hopefully, we will see that he is going to respond.     Volanda Napoleon, MD 1/5/202112:35 PM

## 2019-04-18 ENCOUNTER — Inpatient Hospital Stay: Payer: PPO

## 2019-04-18 ENCOUNTER — Other Ambulatory Visit: Payer: Self-pay | Admitting: Hematology & Oncology

## 2019-04-18 DIAGNOSIS — C772 Secondary and unspecified malignant neoplasm of intra-abdominal lymph nodes: Secondary | ICD-10-CM

## 2019-04-18 DIAGNOSIS — C671 Malignant neoplasm of dome of bladder: Secondary | ICD-10-CM

## 2019-04-18 DIAGNOSIS — C679 Malignant neoplasm of bladder, unspecified: Secondary | ICD-10-CM

## 2019-04-18 DIAGNOSIS — Z5111 Encounter for antineoplastic chemotherapy: Secondary | ICD-10-CM | POA: Diagnosis not present

## 2019-04-18 LAB — URINE CULTURE: Culture: NO GROWTH

## 2019-04-18 MED ORDER — SODIUM CHLORIDE 0.9 % IV SOLN
Freq: Once | INTRAVENOUS | Status: AC
  Filled 2019-04-18: qty 60

## 2019-04-18 MED ORDER — SODIUM CHLORIDE 0.9% FLUSH
10.0000 mL | INTRAVENOUS | Status: DC | PRN
  Administered 2019-04-18: 16:00:00 10 mL
  Filled 2019-04-18: qty 10

## 2019-04-18 MED ORDER — SODIUM CHLORIDE 0.9% FLUSH
3.0000 mL | INTRAVENOUS | Status: DC | PRN
  Filled 2019-04-18: qty 10

## 2019-04-18 MED ORDER — SODIUM CHLORIDE 0.9 % IV SOLN
150.0000 mg | Freq: Once | INTRAVENOUS | Status: AC
  Administered 2019-04-18: 150 mg via INTRAVENOUS
  Filled 2019-04-18: qty 5

## 2019-04-18 MED ORDER — ALTEPLASE 2 MG IJ SOLR
2.0000 mg | Freq: Once | INTRAMUSCULAR | Status: DC | PRN
  Filled 2019-04-18: qty 2

## 2019-04-18 MED ORDER — HEPARIN SOD (PORK) LOCK FLUSH 100 UNIT/ML IV SOLN
250.0000 [IU] | Freq: Once | INTRAVENOUS | Status: DC | PRN
  Filled 2019-04-18: qty 5

## 2019-04-18 MED ORDER — SODIUM CHLORIDE 0.9 % IV SOLN
400.0000 mg/m2 | Freq: Once | INTRAVENOUS | Status: AC
  Administered 2019-04-18: 13:00:00 1000 mg via INTRAVENOUS
  Filled 2019-04-18: qty 10

## 2019-04-18 MED ORDER — DEXAMETHASONE SODIUM PHOSPHATE 10 MG/ML IJ SOLN
10.0000 mg | Freq: Once | INTRAMUSCULAR | Status: AC
  Administered 2019-04-18: 12:00:00 10 mg via INTRAVENOUS

## 2019-04-18 MED ORDER — DEXAMETHASONE SODIUM PHOSPHATE 10 MG/ML IJ SOLN
INTRAMUSCULAR | Status: AC
  Filled 2019-04-18: qty 1

## 2019-04-18 MED ORDER — SODIUM CHLORIDE 0.9 % IV SOLN
Freq: Once | INTRAVENOUS | Status: AC
  Filled 2019-04-18: qty 250

## 2019-04-18 MED ORDER — HEPARIN SOD (PORK) LOCK FLUSH 100 UNIT/ML IV SOLN
500.0000 [IU] | Freq: Once | INTRAVENOUS | Status: AC | PRN
  Administered 2019-04-18: 500 [IU]
  Filled 2019-04-18: qty 5

## 2019-04-18 NOTE — Patient Instructions (Signed)
[facility] Discharge Instructions for Patients Receiving Chemotherapy  Today you received the following chemotherapy agents Ifosphamide,, mesna  To help prevent nausea and vomiting after your treatment, we encourage you to take your nausea medication   1) Beginning Friday take Dexamethasone (Decadron) 2 tablets by mouth daily.  Take this for 2 days.  Take in the morning and take with breakfast  2) At any time, if you experience nausea you can take Ativan (Lorazepam) or Prochlorperazine (Compazine)  by mouth every 6 hours as needed for nausea or vomiting.  3) Beginning Friday, if you experience nausea, you can take Ondansetron (Zofran) twice daily as needed for nausea or vomiting.  Can still take Ativan or Compazine for nausea if needed.   3)    If you develop nausea and vomiting that is not controlled by your nausea medication, call the clinic.   BELOW ARE SYMPTOMS THAT SHOULD BE REPORTED IMMEDIATELY:  *FEVER GREATER THAN 100.5 F  *CHILLS WITH OR WITHOUT FEVER  NAUSEA AND VOMITING THAT IS NOT CONTROLLED WITH YOUR NAUSEA MEDICATION  *UNUSUAL SHORTNESS OF BREATH  *UNUSUAL BRUISING OR BLEEDING  TENDERNESS IN MOUTH AND THROAT WITH OR WITHOUT PRESENCE OF ULCERS  *URINARY PROBLEMS  *BOWEL PROBLEMS  UNUSUAL RASH Items with * indicate a potential emergency and should be followed up as soon as possible.  Feel free to call the clinic should you have any questions or concerns. The clinic phone number is [phone].  Please show the [facility] at check-in to the Emergency Department and triage nurse.

## 2019-04-19 ENCOUNTER — Other Ambulatory Visit: Payer: Self-pay | Admitting: Hematology & Oncology

## 2019-04-19 ENCOUNTER — Inpatient Hospital Stay: Payer: PPO

## 2019-04-19 ENCOUNTER — Other Ambulatory Visit: Payer: Self-pay

## 2019-04-19 VITALS — BP 146/83 | HR 85 | Temp 97.1°F

## 2019-04-19 DIAGNOSIS — C679 Malignant neoplasm of bladder, unspecified: Secondary | ICD-10-CM

## 2019-04-19 DIAGNOSIS — C772 Secondary and unspecified malignant neoplasm of intra-abdominal lymph nodes: Secondary | ICD-10-CM

## 2019-04-19 DIAGNOSIS — Z5111 Encounter for antineoplastic chemotherapy: Secondary | ICD-10-CM | POA: Diagnosis not present

## 2019-04-19 MED ORDER — SODIUM CHLORIDE 0.9 % IV SOLN
400.0000 mg/m2 | Freq: Once | INTRAVENOUS | Status: AC
  Administered 2019-04-19: 1000 mg via INTRAVENOUS
  Filled 2019-04-19: qty 10

## 2019-04-19 MED ORDER — PALONOSETRON HCL INJECTION 0.25 MG/5ML
0.2500 mg | Freq: Once | INTRAVENOUS | Status: AC
  Administered 2019-04-19: 0.25 mg via INTRAVENOUS

## 2019-04-19 MED ORDER — SODIUM CHLORIDE 0.9 % IV SOLN
Freq: Once | INTRAVENOUS | Status: AC
  Filled 2019-04-19: qty 250

## 2019-04-19 MED ORDER — DEXAMETHASONE SODIUM PHOSPHATE 10 MG/ML IJ SOLN
INTRAMUSCULAR | Status: AC
  Filled 2019-04-19: qty 1

## 2019-04-19 MED ORDER — PEGFILGRASTIM 6 MG/0.6ML ~~LOC~~ PSKT
PREFILLED_SYRINGE | SUBCUTANEOUS | Status: AC
  Filled 2019-04-19: qty 0.6

## 2019-04-19 MED ORDER — HOT PACK MISC ONCOLOGY
1.0000 | Freq: Once | Status: DC | PRN
  Filled 2019-04-19: qty 1

## 2019-04-19 MED ORDER — PEGFILGRASTIM 6 MG/0.6ML ~~LOC~~ PSKT
6.0000 mg | PREFILLED_SYRINGE | Freq: Once | SUBCUTANEOUS | Status: AC
  Administered 2019-04-19: 6 mg via SUBCUTANEOUS

## 2019-04-19 MED ORDER — SODIUM CHLORIDE 0.9% FLUSH
10.0000 mL | INTRAVENOUS | Status: DC | PRN
  Administered 2019-04-19: 10 mL
  Filled 2019-04-19: qty 10

## 2019-04-19 MED ORDER — DEXAMETHASONE SODIUM PHOSPHATE 10 MG/ML IJ SOLN
10.0000 mg | Freq: Once | INTRAMUSCULAR | Status: AC
  Administered 2019-04-19: 10 mg via INTRAVENOUS

## 2019-04-19 MED ORDER — HEPARIN SOD (PORK) LOCK FLUSH 100 UNIT/ML IV SOLN
500.0000 [IU] | Freq: Once | INTRAVENOUS | Status: AC | PRN
  Administered 2019-04-19: 500 [IU]
  Filled 2019-04-19: qty 5

## 2019-04-19 MED ORDER — SODIUM CHLORIDE 0.9 % IV SOLN
Freq: Once | INTRAVENOUS | Status: AC
  Filled 2019-04-19: qty 60

## 2019-04-19 MED ORDER — PALONOSETRON HCL INJECTION 0.25 MG/5ML
INTRAVENOUS | Status: AC
  Filled 2019-04-19: qty 5

## 2019-04-19 NOTE — Patient Instructions (Signed)
[facility] Discharge Instructions for Patients Receiving Chemotherapy  Today you received the following chemotherapy agents Ifosphamide,, mesna  To help prevent nausea and vomiting after your treatment, we encourage you to take your nausea medication   1) Beginning Friday take Dexamethasone (Decadron) 2 tablets by mouth daily.  Take this for 2 days.  Take in the morning and take with breakfast  2) At any time, if you experience nausea you can take Ativan (Lorazepam) or Prochlorperazine (Compazine)  by mouth every 6 hours as needed for nausea or vomiting.  3) Beginning Friday, if you experience nausea, you can take Ondansetron (Zofran) twice daily as needed for nausea or vomiting.  Can still take Ativan or Compazine for nausea if needed.   3)    If you develop nausea and vomiting that is not controlled by your nausea medication, call the clinic.   BELOW ARE SYMPTOMS THAT SHOULD BE REPORTED IMMEDIATELY:  *FEVER GREATER THAN 100.5 F  *CHILLS WITH OR WITHOUT FEVER  NAUSEA AND VOMITING THAT IS NOT CONTROLLED WITH YOUR NAUSEA MEDICATION  *UNUSUAL SHORTNESS OF BREATH  *UNUSUAL BRUISING OR BLEEDING  TENDERNESS IN MOUTH AND THROAT WITH OR WITHOUT PRESENCE OF ULCERS  *URINARY PROBLEMS  *BOWEL PROBLEMS  UNUSUAL RASH Items with * indicate a potential emergency and should be followed up as soon as possible.  Feel free to call the clinic should you have any questions or concerns. The clinic phone number is [phone].  Please show the [facility] at check-in to the Emergency Department and triage nurse.

## 2019-04-20 ENCOUNTER — Inpatient Hospital Stay: Payer: PPO

## 2019-04-20 DIAGNOSIS — R2689 Other abnormalities of gait and mobility: Secondary | ICD-10-CM | POA: Diagnosis not present

## 2019-04-20 DIAGNOSIS — R531 Weakness: Secondary | ICD-10-CM | POA: Diagnosis not present

## 2019-04-20 DIAGNOSIS — Z7409 Other reduced mobility: Secondary | ICD-10-CM | POA: Diagnosis not present

## 2019-04-20 DIAGNOSIS — G629 Polyneuropathy, unspecified: Secondary | ICD-10-CM | POA: Diagnosis not present

## 2019-04-20 DIAGNOSIS — R5381 Other malaise: Secondary | ICD-10-CM | POA: Diagnosis not present

## 2019-04-23 DIAGNOSIS — Z7409 Other reduced mobility: Secondary | ICD-10-CM | POA: Diagnosis not present

## 2019-04-23 DIAGNOSIS — R2689 Other abnormalities of gait and mobility: Secondary | ICD-10-CM | POA: Diagnosis not present

## 2019-04-23 DIAGNOSIS — G629 Polyneuropathy, unspecified: Secondary | ICD-10-CM | POA: Diagnosis not present

## 2019-04-23 DIAGNOSIS — R531 Weakness: Secondary | ICD-10-CM | POA: Diagnosis not present

## 2019-04-23 DIAGNOSIS — R5381 Other malaise: Secondary | ICD-10-CM | POA: Diagnosis not present

## 2019-04-24 ENCOUNTER — Encounter: Payer: PPO | Admitting: Nutrition

## 2019-04-26 ENCOUNTER — Telehealth: Payer: Self-pay | Admitting: *Deleted

## 2019-04-26 ENCOUNTER — Other Ambulatory Visit: Payer: Self-pay | Admitting: Family

## 2019-04-26 DIAGNOSIS — C772 Secondary and unspecified malignant neoplasm of intra-abdominal lymph nodes: Secondary | ICD-10-CM

## 2019-04-26 DIAGNOSIS — E7800 Pure hypercholesterolemia, unspecified: Secondary | ICD-10-CM | POA: Diagnosis not present

## 2019-04-26 DIAGNOSIS — R2689 Other abnormalities of gait and mobility: Secondary | ICD-10-CM | POA: Diagnosis not present

## 2019-04-26 DIAGNOSIS — Z8546 Personal history of malignant neoplasm of prostate: Secondary | ICD-10-CM | POA: Diagnosis not present

## 2019-04-26 DIAGNOSIS — R319 Hematuria, unspecified: Secondary | ICD-10-CM

## 2019-04-26 DIAGNOSIS — I480 Paroxysmal atrial fibrillation: Secondary | ICD-10-CM | POA: Diagnosis not present

## 2019-04-26 DIAGNOSIS — G629 Polyneuropathy, unspecified: Secondary | ICD-10-CM | POA: Diagnosis not present

## 2019-04-26 DIAGNOSIS — R531 Weakness: Secondary | ICD-10-CM | POA: Diagnosis not present

## 2019-04-26 DIAGNOSIS — I10 Essential (primary) hypertension: Secondary | ICD-10-CM | POA: Diagnosis not present

## 2019-04-26 DIAGNOSIS — R5381 Other malaise: Secondary | ICD-10-CM | POA: Diagnosis not present

## 2019-04-26 DIAGNOSIS — C679 Malignant neoplasm of bladder, unspecified: Secondary | ICD-10-CM

## 2019-04-26 DIAGNOSIS — Z7409 Other reduced mobility: Secondary | ICD-10-CM | POA: Diagnosis not present

## 2019-04-26 NOTE — Telephone Encounter (Signed)
Call placed back to patient to notify him that referral has been made by order of [name] for pt to see Dr. Diona Fanti at [facility].  Pt appreciative of call and has no questions at this time.

## 2019-04-26 NOTE — Telephone Encounter (Signed)
Call received from patient requesting a copy of his PET scan be mailed to him and to inform [name] that his urine remains bloody.  [name] notified and would like for pt to see a urologist.  Pt states that he has not seen a urologist for 3 years.  [name] notified and states that he will contact Dr. Diona Fanti regarding pt.

## 2019-05-01 DIAGNOSIS — M6281 Muscle weakness (generalized): Secondary | ICD-10-CM | POA: Diagnosis not present

## 2019-05-02 ENCOUNTER — Other Ambulatory Visit: Payer: PPO

## 2019-05-02 ENCOUNTER — Ambulatory Visit: Payer: PPO | Admitting: Hematology & Oncology

## 2019-05-02 DIAGNOSIS — R5381 Other malaise: Secondary | ICD-10-CM | POA: Diagnosis not present

## 2019-05-02 DIAGNOSIS — Z7409 Other reduced mobility: Secondary | ICD-10-CM | POA: Diagnosis not present

## 2019-05-02 DIAGNOSIS — G629 Polyneuropathy, unspecified: Secondary | ICD-10-CM | POA: Diagnosis not present

## 2019-05-02 DIAGNOSIS — R531 Weakness: Secondary | ICD-10-CM | POA: Diagnosis not present

## 2019-05-02 DIAGNOSIS — R2689 Other abnormalities of gait and mobility: Secondary | ICD-10-CM | POA: Diagnosis not present

## 2019-05-04 DIAGNOSIS — C678 Malignant neoplasm of overlapping sites of bladder: Secondary | ICD-10-CM | POA: Diagnosis not present

## 2019-05-04 DIAGNOSIS — C778 Secondary and unspecified malignant neoplasm of lymph nodes of multiple regions: Secondary | ICD-10-CM | POA: Diagnosis not present

## 2019-05-04 DIAGNOSIS — R310 Gross hematuria: Secondary | ICD-10-CM | POA: Diagnosis not present

## 2019-05-07 ENCOUNTER — Telehealth: Payer: Self-pay | Admitting: Hematology & Oncology

## 2019-05-07 ENCOUNTER — Encounter: Payer: Self-pay | Admitting: Hematology & Oncology

## 2019-05-07 ENCOUNTER — Inpatient Hospital Stay: Payer: PPO

## 2019-05-07 ENCOUNTER — Inpatient Hospital Stay (HOSPITAL_BASED_OUTPATIENT_CLINIC_OR_DEPARTMENT_OTHER): Payer: PPO | Admitting: Hematology & Oncology

## 2019-05-07 ENCOUNTER — Other Ambulatory Visit: Payer: Self-pay | Admitting: *Deleted

## 2019-05-07 ENCOUNTER — Other Ambulatory Visit: Payer: Self-pay

## 2019-05-07 VITALS — BP 143/61 | HR 100 | Temp 97.2°F | Resp 18 | Wt 270.0 lb

## 2019-05-07 DIAGNOSIS — C772 Secondary and unspecified malignant neoplasm of intra-abdominal lymph nodes: Secondary | ICD-10-CM

## 2019-05-07 DIAGNOSIS — C679 Malignant neoplasm of bladder, unspecified: Secondary | ICD-10-CM

## 2019-05-07 DIAGNOSIS — R319 Hematuria, unspecified: Secondary | ICD-10-CM

## 2019-05-07 DIAGNOSIS — Z5111 Encounter for antineoplastic chemotherapy: Secondary | ICD-10-CM | POA: Diagnosis not present

## 2019-05-07 LAB — URINALYSIS, COMPLETE (UACMP) WITH MICROSCOPIC
Bilirubin Urine: NEGATIVE
Glucose, UA: NEGATIVE mg/dL
Ketones, ur: NEGATIVE mg/dL
Nitrite: NEGATIVE
Protein, ur: NEGATIVE mg/dL
Specific Gravity, Urine: 1.025 (ref 1.005–1.030)
WBC, UA: >50 WBC/hpf (ref 0–5)
pH: 5.5 (ref 5.0–8.0)

## 2019-05-07 LAB — SAVE SMEAR(SSMR), FOR PROVIDER SLIDE REVIEW

## 2019-05-07 LAB — CMP (CANCER CENTER ONLY)
ALT: 78 U/L — ABNORMAL HIGH (ref 0–44)
AST: 63 U/L — ABNORMAL HIGH (ref 15–41)
Albumin: 3.7 g/dL (ref 3.5–5.0)
Alkaline Phosphatase: 126 U/L (ref 38–126)
Anion gap: 7 (ref 5–15)
BUN: 32 mg/dL — ABNORMAL HIGH (ref 8–23)
CO2: 31 mmol/L (ref 22–32)
Calcium: 9.2 mg/dL (ref 8.9–10.3)
Chloride: 100 mmol/L (ref 98–111)
Creatinine: 1.92 mg/dL — ABNORMAL HIGH (ref 0.61–1.24)
GFR, Est AFR Am: 41 mL/min — ABNORMAL LOW (ref >60)
GFR, Estimated: 35 mL/min — ABNORMAL LOW (ref >60)
Glucose, Bld: 162 mg/dL — ABNORMAL HIGH (ref 70–99)
Potassium: 3.9 mmol/L (ref 3.5–5.1)
Sodium: 138 mmol/L (ref 135–145)
Total Bilirubin: 0.5 mg/dL (ref 0.3–1.2)
Total Protein: 6.4 g/dL — ABNORMAL LOW (ref 6.5–8.1)

## 2019-05-07 LAB — CBC WITH DIFFERENTIAL (CANCER CENTER ONLY)
Abs Immature Granulocytes: 0.1 10*3/uL — ABNORMAL HIGH (ref 0.00–0.07)
Basophils Absolute: 0 10*3/uL (ref 0.0–0.1)
Basophils Relative: 0 %
Eosinophils Absolute: 0 10*3/uL (ref 0.0–0.5)
Eosinophils Relative: 0 %
HCT: 32.4 % — ABNORMAL LOW (ref 39.0–52.0)
Hemoglobin: 10.4 g/dL — ABNORMAL LOW (ref 13.0–17.0)
Immature Granulocytes: 1 %
Lymphocytes Relative: 12 %
Lymphs Abs: 1.1 10*3/uL (ref 0.7–4.0)
MCH: 30.1 pg (ref 26.0–34.0)
MCHC: 32.1 g/dL (ref 30.0–36.0)
MCV: 93.9 fL (ref 80.0–100.0)
Monocytes Absolute: 0.8 10*3/uL (ref 0.1–1.0)
Monocytes Relative: 9 %
Neutro Abs: 6.9 10*3/uL (ref 1.7–7.7)
Neutrophils Relative %: 78 %
Platelet Count: 182 10*3/uL (ref 150–400)
RBC: 3.45 MIL/uL — ABNORMAL LOW (ref 4.22–5.81)
RDW: 14.1 % (ref 11.5–15.5)
WBC Count: 8.9 10*3/uL (ref 4.0–10.5)
nRBC: 0 % (ref 0.0–0.2)

## 2019-05-07 LAB — IRON AND TIBC
Iron: 39 ug/dL — ABNORMAL LOW (ref 42–163)
Saturation Ratios: 19 % — ABNORMAL LOW (ref 20–55)
TIBC: 204 ug/dL (ref 202–409)
UIBC: 165 ug/dL (ref 117–376)

## 2019-05-07 LAB — LACTATE DEHYDROGENASE: LDH: 206 U/L — ABNORMAL HIGH (ref 98–192)

## 2019-05-07 LAB — FERRITIN: Ferritin: 1943 ng/mL — ABNORMAL HIGH (ref 24–336)

## 2019-05-07 MED ORDER — SODIUM CHLORIDE 0.9 % IV SOLN
Freq: Once | INTRAVENOUS | Status: AC
  Filled 2019-05-07: qty 250

## 2019-05-07 MED ORDER — PALONOSETRON HCL INJECTION 0.25 MG/5ML
INTRAVENOUS | Status: AC
  Filled 2019-05-07: qty 5

## 2019-05-07 MED ORDER — SODIUM CHLORIDE 0.9% FLUSH
10.0000 mL | INTRAVENOUS | Status: DC | PRN
  Administered 2019-05-07: 10 mL
  Filled 2019-05-07: qty 10

## 2019-05-07 MED ORDER — SODIUM CHLORIDE 0.9 % IV SOLN
400.0000 mg/m2 | Freq: Once | INTRAVENOUS | Status: AC
  Administered 2019-05-07: 12:00:00 1000 mg via INTRAVENOUS
  Filled 2019-05-07: qty 10

## 2019-05-07 MED ORDER — DEXAMETHASONE SODIUM PHOSPHATE 10 MG/ML IJ SOLN
INTRAMUSCULAR | Status: AC
  Filled 2019-05-07: qty 1

## 2019-05-07 MED ORDER — PALONOSETRON HCL INJECTION 0.25 MG/5ML
0.2500 mg | Freq: Once | INTRAVENOUS | Status: AC
  Administered 2019-05-07: 10:00:00 0.25 mg via INTRAVENOUS

## 2019-05-07 MED ORDER — DEXAMETHASONE SODIUM PHOSPHATE 10 MG/ML IJ SOLN
10.0000 mg | Freq: Once | INTRAMUSCULAR | Status: AC
  Administered 2019-05-07: 10:00:00 10 mg via INTRAVENOUS

## 2019-05-07 MED ORDER — SODIUM CHLORIDE 0.9 % IV SOLN
500.0000 mg/m2 | Freq: Once | INTRAVENOUS | Status: AC
  Administered 2019-05-07: 11:00:00 1254 mg via INTRAVENOUS
  Filled 2019-05-07: qty 26.3

## 2019-05-07 MED ORDER — SODIUM CHLORIDE 0.9 % IV SOLN
Freq: Once | INTRAVENOUS | Status: AC
  Filled 2019-05-07: qty 60

## 2019-05-07 MED ORDER — HEPARIN SOD (PORK) LOCK FLUSH 100 UNIT/ML IV SOLN
500.0000 [IU] | Freq: Once | INTRAVENOUS | Status: AC | PRN
  Administered 2019-05-07: 500 [IU]
  Filled 2019-05-07: qty 5

## 2019-05-07 NOTE — Patient Instructions (Signed)
[facility] Discharge Instructions for Patients Receiving Chemotherapy  Today you received the following chemotherapy agents Gemzar, Ifosphamide  To help prevent nausea and vomiting after your treatment, we encourage you to take your nausea medication    If you develop nausea and vomiting that is not controlled by your nausea medication, call the clinic.   BELOW ARE SYMPTOMS THAT SHOULD BE REPORTED IMMEDIATELY:  *FEVER GREATER THAN 100.5 F  *CHILLS WITH OR WITHOUT FEVER  NAUSEA AND VOMITING THAT IS NOT CONTROLLED WITH YOUR NAUSEA MEDICATION  *UNUSUAL SHORTNESS OF BREATH  *UNUSUAL BRUISING OR BLEEDING  TENDERNESS IN MOUTH AND THROAT WITH OR WITHOUT PRESENCE OF ULCERS  *URINARY PROBLEMS  *BOWEL PROBLEMS  UNUSUAL RASH Items with * indicate a potential emergency and should be followed up as soon as possible.  Feel free to call the clinic should you have any questions or concerns. The clinic phone number is [phone].  Please show the [place] at check-in to the Emergency Department and triage nurse.

## 2019-05-07 NOTE — Patient Instructions (Signed)
Implanted Port Insertion, Care After °This sheet gives you information about how to care for yourself after your procedure. Your health care provider may also give you more specific instructions. If you have problems or questions, contact your health care provider. °What can I expect after the procedure? °After the procedure, it is common to have: °· Discomfort at the port insertion site. °· Bruising on the skin over the port. This should improve over 3-4 days. °Follow these instructions at home: °Port care °· After your port is placed, you will get a manufacturer's information card. The card has information about your port. Keep this card with you at all times. °· Take care of the port as told by your health care provider. Ask your health care provider if you or a family member can get training for taking care of the port at home. A home health care nurse may also take care of the port. °· Make sure to remember what type of port you have. °Incision care ° °  ° °· Follow instructions from your health care provider about how to take care of your port insertion site. Make sure you: °? Wash your hands with soap and water before and after you change your bandage (dressing). If soap and water are not available, use hand sanitizer. °? Change your dressing as told by your health care provider. °? Leave stitches (sutures), skin glue, or adhesive strips in place. These skin closures may need to stay in place for 2 weeks or longer. If adhesive strip edges start to loosen and curl up, you may trim the loose edges. Do not remove adhesive strips completely unless your health care provider tells you to do that. °· Check your port insertion site every day for signs of infection. Check for: °? Redness, swelling, or pain. °? Fluid or blood. °? Warmth. °? Pus or a bad smell. °Activity °· Return to your normal activities as told by your health care provider. Ask your health care provider what activities are safe for you. °· Do not  lift anything that is heavier than 10 lb (4.5 kg), or the limit that you are told, until your health care provider says that it is safe. °General instructions °· Take over-the-counter and prescription medicines only as told by your health care provider. °· Do not take baths, swim, or use a hot tub until your health care provider approves. Ask your health care provider if you may take showers. You may only be allowed to take sponge baths. °· Do not drive for 24 hours if you were given a sedative during your procedure. °· Wear a medical alert bracelet in case of an emergency. This will tell any health care providers that you have a port. °· Keep all follow-up visits as told by your health care provider. This is important. °Contact a health care provider if: °· You cannot flush your port with saline as directed, or you cannot draw blood from the port. °· You have a fever or chills. °· You have redness, swelling, or pain around your port insertion site. °· You have fluid or blood coming from your port insertion site. °· Your port insertion site feels warm to the touch. °· You have pus or a bad smell coming from the port insertion site. °Get help right away if: °· You have chest pain or shortness of breath. °· You have bleeding from your port that you cannot control. °Summary °· Take care of the port as told by your health   care provider. Keep the manufacturer's information card with you at all times. °· Change your dressing as told by your health care provider. °· Contact a health care provider if you have a fever or chills or if you have redness, swelling, or pain around your port insertion site. °· Keep all follow-up visits as told by your health care provider. °This information is not intended to replace advice given to you by your health care provider. Make sure you discuss any questions you have with your health care provider. °Document Revised: 10/25/2017 Document Reviewed: 10/25/2017 °Elsevier Patient Education ©  2020 [employer] ° °

## 2019-05-07 NOTE — Progress Notes (Signed)
Ok to treat today per [name].  Aware of elevated LFTs, and SCr.

## 2019-05-07 NOTE — Progress Notes (Signed)
Hematology and Oncology Follow Up Visit  [name] Rose [id number] 07-12-1950 69 y.o. 05/07/2019   Principle Diagnosis:  Metastatic high-grade bladder cancer - recurrent Diverticular abscess - E.coli  Past Therapy: Atezolizumab 1285m IV q 3 wks - s/p cycle #4 - d/c due to progression. Taxotere 869mm2 IV q 3 wks - s/p cycle #3 - d/c due to progression  Current Therapy:   M-VAC - s/p cycle #8  -- d/c on 07/10/2018 due to blood counts Padcev -- s/p cycle #1 on 07/17/2018 Ifosfamide/Gemzar -- s/p cycle # 1 - started 04/16/2019   Interim History: [name] here today for follow-up.  He tolerated the first cycle of chemotherapy fairly well.  He really had no nausea or vomiting.  He did see urology.  He had a cystoscopy.  There was some irritation that was noted.  There was really not much in the way of hematuria.  On a urinalysis today, he does have a lot of white cells.  We will have to check his urine and see how this looks.  He has had neuropathy.  This does not appear to be any worse.  He has had no problems with diarrhea.  There is no cough.  He has had no fever.  His appetite is good.  He has had no headache.  There is no mouth sores.  Overall, I would say his performance status is ECOG 1.    Medications:  Allergies as of 05/07/2019   No Known Allergies     Medication List       Accurate as of May 07, 2019  9:23 AM. If you have any questions, ask your nurse or doctor.        amoxicillin 500 MG capsule Commonly known as: AMOXIL SMARTSIG:4 Capsule(s) By Mouth Once   apixaban 5 MG Tabs tablet Commonly known as: Eliquis Take 1 tablet (5 mg total) by mouth 2 (two) times daily.   atorvastatin 40 MG tablet Commonly known as: LIPITOR Take 40 mg by mouth daily.   dexamethasone 4 MG tablet Commonly known as: DECADRON Take 2 tablets (8 mg total) by mouth daily. Start the day after chemotherapy for 2 days. Take with food.   diltiazem 360 MG 24 hr  capsule Commonly known as: Cardizem CD Take 1 capsule (360 mg total) by mouth daily.   gabapentin 400 MG capsule Commonly known as: NEURONTIN TAKE 1 CAPSULE (400 MG TOTAL) BY MOUTH 4 (FOUR) TIMES DAILY.   Gerhardt's butt cream Crea Apply 1 application topically 2 (two) times daily.   LORazepam 0.5 MG tablet Commonly known as: Ativan Take 1 tablet (0.5 mg total) by mouth every 6 (six) hours as needed (Nausea or vomiting).   magnesium oxide 400 MG tablet Commonly known as: MAG-OX TAKE 1 TABLET BY MOUTH TWICE A DAY FOR SUPPLEMENT   metoprolol succinate 25 MG 24 hr tablet Commonly known as: TOPROL-XL Take 25 mg by mouth at bedtime.   metoprolol tartrate 25 MG tablet Commonly known as: LOPRESSOR TAKE 1 TABLET BY MOUTH EVERY 6 HOURS AS NEEDED FOR HIGH HEART RATE (HR 100)   ondansetron 8 MG tablet Commonly known as: Zofran Take 1 tablet (8 mg total) by mouth 2 (two) times daily as needed for refractory nausea / vomiting. Start 2 days after chemotherapy.   OneTouch Delica Lancets 3365Yisc 2 (two) times daily. as directed   [place]/Device Kit See admin instructions.   OneTouch Verio test strip Generic drug: glucose blood 2 (  two) times daily. for testing   Potassium Chloride ER 20 MEQ Tbcr TAKE 2 TABLETS BY MOUTH EVERY DAY FOR SUPPLEMENT   potassium chloride SA 20 MEQ tablet Commonly known as: KLOR-CON Take 20 mEq by mouth every morning.   prochlorperazine 10 MG tablet Commonly known as: COMPAZINE Take 1 tablet (10 mg total) by mouth every 6 (six) hours as needed (Nausea or vomiting).   pyridoxine 100 MG tablet Commonly known as: B-6 Take 100 mg by mouth daily.   traMADol 50 MG tablet Commonly known as: ULTRAM Take 50 mg by mouth daily as needed.   Vitamin D (Ergocalciferol) 1.25 MG (50000 UNIT) Caps capsule Commonly known as: DRISDOL TAKE 1 CAPSULE BY MOUTH EVERY SATURDAYS FOR SUPPLEMENT FOR 8 WEEKS       Allergies: No Known  Allergies  Past Medical History, Surgical history, Social history, and Family History were reviewed and updated.  Review of Systems: Review of Systems  Constitutional: Negative.   HENT: Negative.   Eyes: Negative.   Respiratory: Negative.   Cardiovascular: Negative.   Gastrointestinal: Negative.   Genitourinary: Negative.   Musculoskeletal: Negative.   Skin: Negative.   Neurological: Positive for tingling.  Endo/Heme/Allergies: Negative.   Psychiatric/Behavioral: Negative.      Physical Exam:  weight is 270 lb (122.5 kg). His temporal temperature is 97.2 F (36.2 C) (abnormal). His blood pressure is 143/61 (abnormal) and his pulse is 100. His respiration is 18 and oxygen saturation is 94%.   Wt Readings from Last 3 Encounters:  05/07/19 270 lb (122.5 kg)  04/17/19 271 lb 4 oz (123 kg)  04/10/19 267 lb (121.1 kg)    Physical Exam Vitals reviewed.  HENT:     Head: Normocephalic and atraumatic.  Eyes:     Pupils: Pupils are equal, round, and reactive to light.  Cardiovascular:     Comments: Cardiac exam shows an irregular rate and irregular rhythm consistent with atrial fibrillation.  The rate is fairly well controlled.  I do not detect any murmurs, rubs or bruits. Pulmonary:     Effort: Pulmonary effort is normal.     Breath sounds: Normal breath sounds.  Abdominal:     General: Bowel sounds are normal.     Palpations: Abdomen is soft.  Musculoskeletal:        General: No tenderness or deformity. Normal range of motion.     Cervical back: Normal range of motion.  Lymphadenopathy:     Cervical: No cervical adenopathy.  Skin:    General: Skin is warm and dry.     Findings: No erythema or rash.  Neurological:     Mental Status: He is alert and oriented to person, place, and time.  Psychiatric:        Behavior: Behavior normal.        Thought Content: Thought content normal.        Judgment: Judgment normal.      Lab Results  Component Value Date   WBC 8.9  05/07/2019   HGB 10.4 (L) 05/07/2019   HCT 32.4 (L) 05/07/2019   MCV 93.9 05/07/2019   PLT 182 05/07/2019   Lab Results  Component Value Date   FERRITIN 1,669 (H) 10/08/2018   IRON 9 (L) 10/08/2018   TIBC 176 (L) 10/08/2018   UIBC 167 10/08/2018   IRONPCTSAT 5 (L) 10/08/2018   Lab Results  Component Value Date   RETICCTPCT 3.7 (H) 09/18/2018   RBC 3.45 (L) 05/07/2019   No results found for:  KPAFRELGTCHN, LAMBDASER, KAPLAMBRATIO No results found for: Kandis Cocking, IGMSERUM No results found for: [name], SPEI   Chemistry      Component Value Date/Time   NA 137 04/17/2019 0857   NA 137 12/29/2018 1241   NA 141 02/21/2017 0902   K 4.2 04/17/2019 0857   K 4.1 02/21/2017 0902   CL 102 04/17/2019 0857   CL 98 02/21/2017 0902   CO2 28 04/17/2019 0857   CO2 31 02/21/2017 0902   BUN 34 (H) 04/17/2019 0857   BUN 36 (H) 12/29/2018 1241   BUN 17 02/21/2017 0902   CREATININE 2.00 (H) 04/17/2019 0857   CREATININE 1.2 02/21/2017 0902      Component Value Date/Time   CALCIUM 9.3 04/17/2019 0857   CALCIUM 9.2 02/21/2017 0902   ALKPHOS 57 04/17/2019 0857   ALKPHOS 65 02/21/2017 0902   AST 23 04/17/2019 0857   ALT 13 04/17/2019 0857   ALT 18 02/21/2017 0902   BILITOT 0.7 04/17/2019 0857       Impression and Plan: Mr. Carducci is a very pleasant 69 year old Caucasian gentleman with metastatic high grade bladder cancer.   His labs look pretty good.  His renal function is a little bit down but clearly not any worse.  His liver function studies were slightly higher.  I suspect this probably is from treatment.  However, I do think this should stop is right now.  We will go ahead with his second cycle of treatment.  Seen that he is doing well.  I would probably try to go with 3 cycles and then repeat his PET scan.  We will get him back here in another 3 weeks.    Volanda Napoleon, MD 1/25/20219:23 AM

## 2019-05-07 NOTE — Telephone Encounter (Signed)
Appointments scheduled calendar printed per 1/25 los 

## 2019-05-08 ENCOUNTER — Inpatient Hospital Stay: Payer: PPO | Admitting: Nutrition

## 2019-05-08 ENCOUNTER — Inpatient Hospital Stay: Payer: PPO

## 2019-05-08 VITALS — BP 115/89 | HR 98 | Temp 97.6°F | Resp 17

## 2019-05-08 DIAGNOSIS — C772 Secondary and unspecified malignant neoplasm of intra-abdominal lymph nodes: Secondary | ICD-10-CM

## 2019-05-08 DIAGNOSIS — C679 Malignant neoplasm of bladder, unspecified: Secondary | ICD-10-CM

## 2019-05-08 DIAGNOSIS — Z5111 Encounter for antineoplastic chemotherapy: Secondary | ICD-10-CM | POA: Diagnosis not present

## 2019-05-08 LAB — URINE CULTURE: Culture: NO GROWTH

## 2019-05-08 MED ORDER — SODIUM CHLORIDE 0.9 % IV SOLN
Freq: Once | INTRAVENOUS | Status: AC
  Filled 2019-05-08: qty 60

## 2019-05-08 MED ORDER — DEXAMETHASONE SODIUM PHOSPHATE 10 MG/ML IJ SOLN
INTRAMUSCULAR | Status: AC
  Filled 2019-05-08: qty 1

## 2019-05-08 MED ORDER — SODIUM CHLORIDE 0.9 % IV SOLN
150.0000 mg | Freq: Once | INTRAVENOUS | Status: AC
  Administered 2019-05-08: 09:00:00 150 mg via INTRAVENOUS
  Filled 2019-05-08: qty 5

## 2019-05-08 MED ORDER — DEXAMETHASONE SODIUM PHOSPHATE 10 MG/ML IJ SOLN
10.0000 mg | Freq: Once | INTRAMUSCULAR | Status: AC
  Administered 2019-05-08: 10 mg via INTRAVENOUS

## 2019-05-08 MED ORDER — SODIUM CHLORIDE 0.9 % IV SOLN
Freq: Once | INTRAVENOUS | Status: AC
  Filled 2019-05-08: qty 250

## 2019-05-08 MED ORDER — SODIUM CHLORIDE 0.9 % IV SOLN
400.0000 mg/m2 | Freq: Once | INTRAVENOUS | Status: AC
  Administered 2019-05-08: 10:00:00 1000 mg via INTRAVENOUS
  Filled 2019-05-08: qty 10

## 2019-05-08 MED ORDER — HEPARIN SOD (PORK) LOCK FLUSH 100 UNIT/ML IV SOLN
500.0000 [IU] | Freq: Once | INTRAVENOUS | Status: AC | PRN
  Administered 2019-05-08: 13:00:00 500 [IU]
  Filled 2019-05-08: qty 5

## 2019-05-08 MED ORDER — SODIUM CHLORIDE 0.9% FLUSH
10.0000 mL | INTRAVENOUS | Status: DC | PRN
  Administered 2019-05-08: 13:00:00 10 mL
  Filled 2019-05-08: qty 10

## 2019-05-08 NOTE — Progress Notes (Signed)
PA okay for patient to receive OnPro per Otilio Carpen, [employer].

## 2019-05-08 NOTE — Patient Instructions (Signed)
Ifosfamide injection What is this medicine? IFOSFAMIDE (eye FOS fa mide) is a chemotherapy drug. It slows the growth of cancer cells. This medicine is used to treat testicular cancer and other cancers. This medicine may be used for other purposes; ask your health care provider or pharmacist if you have questions. COMMON BRAND NAME(S): Ifex, Ifex/Mesna What should I tell my health care provider before I take this medicine? They need to know if you have any of these conditions:  heart disease  history of irregular heartbeat  immune system problems  infection (especially a virus infection such as chickenpox, cold sores, or herpes)  infections in the bladder, kidneys, or urinary tract  kidney disease  low blood counts, like low white cell, platelet, or red cell counts  lung or breathing disease, like asthma  problems urinating  recent or ongoing radiation therapy  tingling of the fingers or toes, or other nerve disorder  an unusual or allergic reaction to ifosfamide, other medicines, foods, dyes, or preservatives  pregnant or trying to get pregnant  breast-feeding How should I use this medicine? This drug is given as an infusion into a vein. It is administered in a hospital or clinic by a specially trained health care professional. Talk to your pediatrician regarding the use of this medicine in children. Special care may be needed. Overdosage: If you think you have taken too much of this medicine contact a poison control center or emergency room at once. NOTE: This medicine is only for you. Do not share this medicine with others. What if I miss a dose? It is important not to miss your dose. Call your doctor or health care professional if you are unable to keep an appointment. What may interact with this medicine? This medicine may interact with the following medications:  aprepitant, fosaprepitant  certain medicines for fungal infections like fluconazole, itraconazole, and  ketoconazole  certain medicines for seizures like carbamazepine, phenobarbital, phenytoin  grapefruit, grapefruit juice  rifampin  [place] Wort This list may not describe all possible interactions. Give your health care provider a list of all the medicines, herbs, non-prescription drugs, or dietary supplements you use. Also tell them if you smoke, drink alcohol, or use illegal drugs. Some items may interact with your medicine. What should I watch for while using this medicine? Visit your doctor for checks on your progress. This drug may make you feel generally unwell. This is not uncommon, as chemotherapy can affect healthy cells as well as cancer cells. Report any side effects. Continue your course of treatment even though you feel ill unless your doctor tells you to stop. Drink water or other fluids as directed. Urinate often, even at night. In some cases, you may be given additional medicines to help with side effects. Follow all directions for their use. Call your doctor or health care professional for advice if you get a fever, chills or sore throat, or other symptoms of a cold or flu. Do not treat yourself. This drug decreases your body's ability to fight infections. Try to avoid being around people who are sick. This medicine may increase your risk to bruise or bleed. Call your doctor or health care professional if you notice any unusual bleeding. Be careful brushing and flossing your teeth or using a toothpick because you may get an infection or bleed more easily. If you have any dental work done, tell your dentist you are receiving this medicine. Avoid taking products that contain aspirin, acetaminophen, ibuprofen, naproxen, or ketoprofen unless  instructed by your doctor. These medicines may hide a fever. Do not become pregnant while taking this medicine. Women should inform their doctor if they wish to become pregnant or think they might be pregnant. Men should not father a child while  taking this medicine and for 6 months after stopping it. There is a potential for serious side effects to an unborn child. Talk to your health care professional or pharmacist for more information. Do not breast-feed an infant while taking this medicine. This medicine has caused ovarian failure in some women. This medicine may interfere with the ability to have a child. Talk with your doctor or health care professional if you are concerned about your fertility. This medicine has caused reduced sperm counts in some men. This may interfere with the ability to father a child. You should talk to your doctor or health care professional if you are concerned about your fertility. What side effects may I notice from receiving this medicine? Side effects that you should report to your doctor or health care professional as soon as possible:  allergic reactions like skin rash, itching or hives, swelling of the face, lips, or tongue  breathing problems  chest pain or palpitations  confusion  dizziness  hallucination, loss of contact with reality  loss of balance or coordination  nausea, vomiting  pain, tingling, numbness in the hands or feet  seizures  signs of decreased platelets or bleeding - bruising, pinpoint red spots on the skin, black, tarry stools, blood in the urine  signs of decreased red blood cells - unusually weak or tired, feeling faint or lightheaded, falls  signs and symptoms of infection like fever or chills; cough; sore throat; pain or trouble passing urine  signs and symptoms of kidney injury like trouble passing urine or change in the amount of urine  signs and symptoms of liver injury like dark yellow or brown urine; general ill feeling or flu-like symptoms; light-colored stools; loss of appetite; nausea; right upper belly pain; unusually weak or tired; yellowing of the eyes or skin  swelling of the legs or ankles  tremors Side effects that usually do not require  medical attention (report to your doctor or health care professional if they continue or are bothersome):  changes in vision  constipation  decreased hearing  diarrhea  hair loss  headache  loss of appetite  loss of memory  mouth sores  rash This list may not describe all possible side effects. Call your doctor for medical advice about side effects. You may report side effects to FDA at 1-800-FDA-1088. Where should I keep my medicine? This drug is given in a hospital or clinic and will not be stored at home. NOTE: This sheet is a summary. It may not cover all possible information. If you have questions about this medicine, talk to your doctor, pharmacist, or health care provider.  2020 Elsevier/Gold Standard (2016-11-22 09:57:40) Mesna injection What is this medicine? MESNA (MES na) is used to prevent bleeding from the bladder during treatment with ifosfamide. This medicine does not reduce the chance of getting other side effects of cancer chemotherapy. This medicine may be used for other purposes; ask your health care provider or pharmacist if you have questions. COMMON BRAND NAME(S): Mesnex What should I tell my health care provider before I take this medicine? They need to know if you have any of these conditions:  autoimmune disease like lupus, nephritis, or rheumatoid arthritis  an unusual or allergic reaction to mesna, benzyl  alcohol, sulfur medicines, other medicines, foods, dyes, or preservatives  pregnant or trying to get pregnant  breast-feeding How should I use this medicine? This medicine is for infusion into a vein. It is given by a health care professional in a hospital or clinic setting. Talk to your pediatrician regarding the use of this medicine in children. Special care may be needed. Overdosage: If you think you have taken too much of this medicine contact a poison control center or emergency room at once. NOTE: This medicine is only for you. Do not  share this medicine with others. What if I miss a dose? This does not apply. What may interact with this medicine? Interactions are not expected. This list may not describe all possible interactions. Give your health care provider a list of all the medicines, herbs, non-prescription drugs, or dietary supplements you use. Also tell them if you smoke, drink alcohol, or use illegal drugs. Some items may interact with your medicine. What should I watch for while using this medicine? Your doctor will follow your condition closely while you are taking this medicine. Tell your doctor right away if you see that your urine has turned a pink or red color. It is important to drink at least a quart (4 cups) of fluids each day that you take this medicine. This medicine may cause serious skin reactions. They can happen weeks to months after starting the medicine. Contact your health care provider right away if you notice fevers or flu-like symptoms with a rash. The rash may be red or purple and then turn into blisters or peeling of the skin. Or, you might notice a red rash with swelling of the face, lips or lymph nodes in your neck or under your arms. What side effects may I notice from receiving this medicine? Side effects that you should report to your doctor or health care professional as soon as possible:  allergic reactions like skin rash, itching or hives, swelling of the face, lips, or tongue  breathing problems  blood in your urine or pink to red colored urine  fever, chills, or sore throat  flushing or redness to skin  mouth sores  pain or redness at site where injected  redness, blistering, peeling or loosening of the skin, including inside the mouth  swelling of ankles or feet  vomiting Side effects that usually do not require medical attention (report to your doctor or health care professional if they continue or are bothersome):  aches and pains  bad taste in  mouth  diarrhea  dizziness  hair loss  headache  nausea This list may not describe all possible side effects. Call your doctor for medical advice about side effects. You may report side effects to FDA at 1-800-FDA-1088. Where should I keep my medicine? This drug is given in a hospital or clinic and will not be stored at home. NOTE: This sheet is a summary. It may not cover all possible information. If you have questions about this medicine, talk to your doctor, pharmacist, or health care provider.  2020 Elsevier/Gold Standard (2018-06-30 15:28:46)

## 2019-05-08 NOTE — Progress Notes (Signed)
69 year old male diagnosed with Recurrent Bladder Cancer and Diverticular Abscess. He is a patient of [name].  Current Therapy:        M-VAC - s/p cycle#8  -- d/c on 07/10/2018 due to blood counts Padcev -- s/p cycle #1 on 07/17/2018 Ifosfamide/Gemzar -- s/p cycle # 1 - started 04/16/2019  PMH includes Pre-diabetes, HLD, HTN, and Prostate Cancer  Medications include Decadron, Ativan, Mag Ox, Zofran, Compazine, Vitamin B6 and Vitamin D.  Labs include Glucose 143, BUN 34, and Creatinine2.0  Height: 6'2" Weight: 271 pounds Jan 25. UBW: 270 pounds per patient BMI: 34.67.  Patient denies current nutrition impact symptoms.  States he had a little nausea with some of the previous chemotherapy. Reports history of sore mouth and significant weight loss and sacral wound.  Nutrition Diagnosis: Food and Nutrition Related Knowledge Deficit related to Recurrent bladder cancer and associated treatments as evidenced by no prior need for nutrition related information.  Intervention: Educated patient on strategies for consuming adequate protein and calories for weight maintenance or safe slow weight loss. Encouraged nausea medications as needed. Encouraged activity as permitted by MD. Provided fact sheets and contact information. Questions answered and teach back method used.  Monitoring, Evaluation, Goals; Patient will tolerate adequate calories and protein to maintain lean body mass.  Next Visit: Patient will contact me with questions.

## 2019-05-09 ENCOUNTER — Other Ambulatory Visit: Payer: Self-pay

## 2019-05-09 ENCOUNTER — Inpatient Hospital Stay: Payer: PPO

## 2019-05-09 VITALS — BP 123/72 | HR 89 | Temp 97.6°F | Resp 17

## 2019-05-09 DIAGNOSIS — Z5111 Encounter for antineoplastic chemotherapy: Secondary | ICD-10-CM | POA: Diagnosis not present

## 2019-05-09 DIAGNOSIS — C679 Malignant neoplasm of bladder, unspecified: Secondary | ICD-10-CM

## 2019-05-09 DIAGNOSIS — C772 Secondary and unspecified malignant neoplasm of intra-abdominal lymph nodes: Secondary | ICD-10-CM

## 2019-05-09 MED ORDER — DEXAMETHASONE SODIUM PHOSPHATE 10 MG/ML IJ SOLN
10.0000 mg | Freq: Once | INTRAMUSCULAR | Status: AC
  Administered 2019-05-09: 10 mg via INTRAVENOUS

## 2019-05-09 MED ORDER — SODIUM CHLORIDE 0.9% FLUSH
10.0000 mL | INTRAVENOUS | Status: DC | PRN
  Administered 2019-05-09: 10 mL
  Filled 2019-05-09: qty 10

## 2019-05-09 MED ORDER — HEPARIN SOD (PORK) LOCK FLUSH 100 UNIT/ML IV SOLN
500.0000 [IU] | Freq: Once | INTRAVENOUS | Status: AC | PRN
  Administered 2019-05-09: 500 [IU]
  Filled 2019-05-09: qty 5

## 2019-05-09 MED ORDER — PALONOSETRON HCL INJECTION 0.25 MG/5ML
0.2500 mg | Freq: Once | INTRAVENOUS | Status: AC
  Administered 2019-05-09: 0.25 mg via INTRAVENOUS

## 2019-05-09 MED ORDER — DEXAMETHASONE SODIUM PHOSPHATE 10 MG/ML IJ SOLN
INTRAMUSCULAR | Status: AC
  Filled 2019-05-09: qty 1

## 2019-05-09 MED ORDER — SODIUM CHLORIDE 0.9 % IV SOLN
Freq: Once | INTRAVENOUS | Status: AC
  Filled 2019-05-09: qty 250

## 2019-05-09 MED ORDER — PEGFILGRASTIM 6 MG/0.6ML ~~LOC~~ PSKT
PREFILLED_SYRINGE | SUBCUTANEOUS | Status: AC
  Filled 2019-05-09: qty 0.6

## 2019-05-09 MED ORDER — PALONOSETRON HCL INJECTION 0.25 MG/5ML
INTRAVENOUS | Status: AC
  Filled 2019-05-09: qty 5

## 2019-05-09 MED ORDER — SODIUM CHLORIDE 0.9 % IV SOLN
Freq: Once | INTRAVENOUS | Status: AC
  Filled 2019-05-09 (×2): qty 60

## 2019-05-09 MED ORDER — PEGFILGRASTIM 6 MG/0.6ML ~~LOC~~ PSKT
6.0000 mg | PREFILLED_SYRINGE | Freq: Once | SUBCUTANEOUS | Status: AC
  Administered 2019-05-09: 6 mg via SUBCUTANEOUS

## 2019-05-09 MED ORDER — SODIUM CHLORIDE 0.9 % IV SOLN
400.0000 mg/m2 | Freq: Once | INTRAVENOUS | Status: AC
  Administered 2019-05-09: 1000 mg via INTRAVENOUS
  Filled 2019-05-09: qty 10

## 2019-05-09 NOTE — Patient Instructions (Signed)
Ifosfamide injection What is this medicine? IFOSFAMIDE (eye FOS fa mide) is a chemotherapy drug. It slows the growth of cancer cells. This medicine is used to treat testicular cancer and other cancers. This medicine may be used for other purposes; ask your health care provider or pharmacist if you have questions. COMMON BRAND NAME(S): Ifex, Ifex/Mesna What should I tell my health care provider before I take this medicine? They need to know if you have any of these conditions:  heart disease  history of irregular heartbeat  immune system problems  infection (especially a virus infection such as chickenpox, cold sores, or herpes)  infections in the bladder, kidneys, or urinary tract  kidney disease  low blood counts, like low white cell, platelet, or red cell counts  lung or breathing disease, like asthma  problems urinating  recent or ongoing radiation therapy  tingling of the fingers or toes, or other nerve disorder  an unusual or allergic reaction to ifosfamide, other medicines, foods, dyes, or preservatives  pregnant or trying to get pregnant  breast-feeding How should I use this medicine? This drug is given as an infusion into a vein. It is administered in a hospital or clinic by a specially trained health care professional. Talk to your pediatrician regarding the use of this medicine in children. Special care may be needed. Overdosage: If you think you have taken too much of this medicine contact a poison control center or emergency room at once. NOTE: This medicine is only for you. Do not share this medicine with others. What if I miss a dose? It is important not to miss your dose. Call your doctor or health care professional if you are unable to keep an appointment. What may interact with this medicine? This medicine may interact with the following medications:  aprepitant, fosaprepitant  certain medicines for fungal infections like fluconazole, itraconazole, and  ketoconazole  certain medicines for seizures like carbamazepine, phenobarbital, phenytoin  grapefruit, grapefruit juice  rifampin  [place] Wort This list may not describe all possible interactions. Give your health care provider a list of all the medicines, herbs, non-prescription drugs, or dietary supplements you use. Also tell them if you smoke, drink alcohol, or use illegal drugs. Some items may interact with your medicine. What should I watch for while using this medicine? Visit your doctor for checks on your progress. This drug may make you feel generally unwell. This is not uncommon, as chemotherapy can affect healthy cells as well as cancer cells. Report any side effects. Continue your course of treatment even though you feel ill unless your doctor tells you to stop. Drink water or other fluids as directed. Urinate often, even at night. In some cases, you may be given additional medicines to help with side effects. Follow all directions for their use. Call your doctor or health care professional for advice if you get a fever, chills or sore throat, or other symptoms of a cold or flu. Do not treat yourself. This drug decreases your body's ability to fight infections. Try to avoid being around people who are sick. This medicine may increase your risk to bruise or bleed. Call your doctor or health care professional if you notice any unusual bleeding. Be careful brushing and flossing your teeth or using a toothpick because you may get an infection or bleed more easily. If you have any dental work done, tell your dentist you are receiving this medicine. Avoid taking products that contain aspirin, acetaminophen, ibuprofen, naproxen, or ketoprofen unless  instructed by your doctor. These medicines may hide a fever. Do not become pregnant while taking this medicine. Women should inform their doctor if they wish to become pregnant or think they might be pregnant. Men should not father a child while  taking this medicine and for 6 months after stopping it. There is a potential for serious side effects to an unborn child. Talk to your health care professional or pharmacist for more information. Do not breast-feed an infant while taking this medicine. This medicine has caused ovarian failure in some women. This medicine may interfere with the ability to have a child. Talk with your doctor or health care professional if you are concerned about your fertility. This medicine has caused reduced sperm counts in some men. This may interfere with the ability to father a child. You should talk to your doctor or health care professional if you are concerned about your fertility. What side effects may I notice from receiving this medicine? Side effects that you should report to your doctor or health care professional as soon as possible:  allergic reactions like skin rash, itching or hives, swelling of the face, lips, or tongue  breathing problems  chest pain or palpitations  confusion  dizziness  hallucination, loss of contact with reality  loss of balance or coordination  nausea, vomiting  pain, tingling, numbness in the hands or feet  seizures  signs of decreased platelets or bleeding - bruising, pinpoint red spots on the skin, black, tarry stools, blood in the urine  signs of decreased red blood cells - unusually weak or tired, feeling faint or lightheaded, falls  signs and symptoms of infection like fever or chills; cough; sore throat; pain or trouble passing urine  signs and symptoms of kidney injury like trouble passing urine or change in the amount of urine  signs and symptoms of liver injury like dark yellow or brown urine; general ill feeling or flu-like symptoms; light-colored stools; loss of appetite; nausea; right upper belly pain; unusually weak or tired; yellowing of the eyes or skin  swelling of the legs or ankles  tremors Side effects that usually do not require  medical attention (report to your doctor or health care professional if they continue or are bothersome):  changes in vision  constipation  decreased hearing  diarrhea  hair loss  headache  loss of appetite  loss of memory  mouth sores  rash This list may not describe all possible side effects. Call your doctor for medical advice about side effects. You may report side effects to FDA at 1-800-FDA-1088. Where should I keep my medicine? This drug is given in a hospital or clinic and will not be stored at home. NOTE: This sheet is a summary. It may not cover all possible information. If you have questions about this medicine, talk to your doctor, pharmacist, or health care provider.  2020 Elsevier/Gold Standard (2016-11-22 09:57:40) Mesna injection What is this medicine? MESNA (MES na) is used to prevent bleeding from the bladder during treatment with ifosfamide. This medicine does not reduce the chance of getting other side effects of cancer chemotherapy. This medicine may be used for other purposes; ask your health care provider or pharmacist if you have questions. COMMON BRAND NAME(S): Mesnex What should I tell my health care provider before I take this medicine? They need to know if you have any of these conditions:  autoimmune disease like lupus, nephritis, or rheumatoid arthritis  an unusual or allergic reaction to mesna, benzyl  alcohol, sulfur medicines, other medicines, foods, dyes, or preservatives  pregnant or trying to get pregnant  breast-feeding How should I use this medicine? This medicine is for infusion into a vein. It is given by a health care professional in a hospital or clinic setting. Talk to your pediatrician regarding the use of this medicine in children. Special care may be needed. Overdosage: If you think you have taken too much of this medicine contact a poison control center or emergency room at once. NOTE: This medicine is only for you. Do not  share this medicine with others. What if I miss a dose? This does not apply. What may interact with this medicine? Interactions are not expected. This list may not describe all possible interactions. Give your health care provider a list of all the medicines, herbs, non-prescription drugs, or dietary supplements you use. Also tell them if you smoke, drink alcohol, or use illegal drugs. Some items may interact with your medicine. What should I watch for while using this medicine? Your doctor will follow your condition closely while you are taking this medicine. Tell your doctor right away if you see that your urine has turned a pink or red color. It is important to drink at least a quart (4 cups) of fluids each day that you take this medicine. This medicine may cause serious skin reactions. They can happen weeks to months after starting the medicine. Contact your health care provider right away if you notice fevers or flu-like symptoms with a rash. The rash may be red or purple and then turn into blisters or peeling of the skin. Or, you might notice a red rash with swelling of the face, lips or lymph nodes in your neck or under your arms. What side effects may I notice from receiving this medicine? Side effects that you should report to your doctor or health care professional as soon as possible:  allergic reactions like skin rash, itching or hives, swelling of the face, lips, or tongue  breathing problems  blood in your urine or pink to red colored urine  fever, chills, or sore throat  flushing or redness to skin  mouth sores  pain or redness at site where injected  redness, blistering, peeling or loosening of the skin, including inside the mouth  swelling of ankles or feet  vomiting Side effects that usually do not require medical attention (report to your doctor or health care professional if they continue or are bothersome):  aches and pains  bad taste in  mouth  diarrhea  dizziness  hair loss  headache  nausea This list may not describe all possible side effects. Call your doctor for medical advice about side effects. You may report side effects to FDA at 1-800-FDA-1088. Where should I keep my medicine? This drug is given in a hospital or clinic and will not be stored at home. NOTE: This sheet is a summary. It may not cover all possible information. If you have questions about this medicine, talk to your doctor, pharmacist, or health care provider.  2020 Elsevier/Gold Standard (2018-06-30 15:28:46)

## 2019-05-10 DIAGNOSIS — N183 Chronic kidney disease, stage 3 unspecified: Secondary | ICD-10-CM | POA: Diagnosis not present

## 2019-05-11 ENCOUNTER — Ambulatory Visit: Payer: PPO

## 2019-05-14 DIAGNOSIS — R2689 Other abnormalities of gait and mobility: Secondary | ICD-10-CM | POA: Diagnosis not present

## 2019-05-14 DIAGNOSIS — Z7409 Other reduced mobility: Secondary | ICD-10-CM | POA: Diagnosis not present

## 2019-05-14 DIAGNOSIS — R531 Weakness: Secondary | ICD-10-CM | POA: Diagnosis not present

## 2019-05-14 DIAGNOSIS — G629 Polyneuropathy, unspecified: Secondary | ICD-10-CM | POA: Diagnosis not present

## 2019-05-14 DIAGNOSIS — R5381 Other malaise: Secondary | ICD-10-CM | POA: Diagnosis not present

## 2019-05-15 ENCOUNTER — Other Ambulatory Visit (HOSPITAL_COMMUNITY)
Admission: RE | Admit: 2019-05-15 | Discharge: 2019-05-15 | Disposition: A | Discharge status: Home / Self Care | Payer: PPO | Source: Ambulatory Visit | Attending: Cardiology | Admitting: Cardiology

## 2019-05-15 DIAGNOSIS — Z20822 Contact with and (suspected) exposure to covid-19: Secondary | ICD-10-CM | POA: Insufficient documentation

## 2019-05-15 DIAGNOSIS — Z01812 Encounter for preprocedural laboratory examination: Secondary | ICD-10-CM | POA: Insufficient documentation

## 2019-05-15 DIAGNOSIS — I4891 Unspecified atrial fibrillation: Secondary | ICD-10-CM | POA: Diagnosis not present

## 2019-05-15 LAB — SARS CORONAVIRUS 2 (TAT 6-24 HRS): SARS Coronavirus 2: NEGATIVE

## 2019-05-16 DIAGNOSIS — G629 Polyneuropathy, unspecified: Secondary | ICD-10-CM | POA: Diagnosis not present

## 2019-05-16 DIAGNOSIS — E559 Vitamin D deficiency, unspecified: Secondary | ICD-10-CM | POA: Diagnosis not present

## 2019-05-16 DIAGNOSIS — N1832 Chronic kidney disease, stage 3b: Secondary | ICD-10-CM | POA: Diagnosis not present

## 2019-05-16 DIAGNOSIS — R5381 Other malaise: Secondary | ICD-10-CM | POA: Diagnosis not present

## 2019-05-16 DIAGNOSIS — R2689 Other abnormalities of gait and mobility: Secondary | ICD-10-CM | POA: Diagnosis not present

## 2019-05-16 DIAGNOSIS — Z7409 Other reduced mobility: Secondary | ICD-10-CM | POA: Diagnosis not present

## 2019-05-16 DIAGNOSIS — R531 Weakness: Secondary | ICD-10-CM | POA: Diagnosis not present

## 2019-05-16 DIAGNOSIS — I480 Paroxysmal atrial fibrillation: Secondary | ICD-10-CM | POA: Diagnosis not present

## 2019-05-16 DIAGNOSIS — I129 Hypertensive chronic kidney disease with stage 1 through stage 4 chronic kidney disease, or unspecified chronic kidney disease: Secondary | ICD-10-CM | POA: Diagnosis not present

## 2019-05-16 DIAGNOSIS — E8770 Fluid overload, unspecified: Secondary | ICD-10-CM | POA: Diagnosis not present

## 2019-05-16 DIAGNOSIS — E876 Hypokalemia: Secondary | ICD-10-CM | POA: Diagnosis not present

## 2019-05-16 DIAGNOSIS — C679 Malignant neoplasm of bladder, unspecified: Secondary | ICD-10-CM | POA: Diagnosis not present

## 2019-05-16 LAB — BASIC METABOLIC PANEL
BUN/Creatinine Ratio: 21 (ref 10–24)
BUN: 36 mg/dL — ABNORMAL HIGH (ref 8–27)
CO2: 25 mmol/L (ref 20–29)
Calcium: 9.1 mg/dL (ref 8.6–10.2)
Chloride: 100 mmol/L (ref 96–106)
Creatinine, Ser: 1.73 mg/dL — ABNORMAL HIGH (ref 0.76–1.27)
GFR calc Af Amer: 46 mL/min/{1.73_m2} — ABNORMAL LOW (ref >59)
GFR calc non Af Amer: 40 mL/min/{1.73_m2} — ABNORMAL LOW (ref >59)
Glucose: 138 mg/dL — ABNORMAL HIGH (ref 65–99)
Potassium: 4 mmol/L (ref 3.5–5.2)
Sodium: 140 mmol/L (ref 134–144)

## 2019-05-16 LAB — CBC
Hematocrit: 30.8 % — ABNORMAL LOW (ref 37.5–51.0)
Hemoglobin: 10.6 g/dL — ABNORMAL LOW (ref 13.0–17.7)
MCH: 30.2 pg (ref 26.6–33.0)
MCHC: 34.4 g/dL (ref 31.5–35.7)
MCV: 88 fL (ref 79–97)
Platelets: 89 10*3/uL — CL (ref 150–450)
RBC: 3.51 x10E6/uL — ABNORMAL LOW (ref 4.14–5.80)
RDW: 13.6 % (ref 11.6–15.4)
WBC: 17.1 10*3/uL — ABNORMAL HIGH (ref 3.4–10.8)

## 2019-05-17 ENCOUNTER — Other Ambulatory Visit: Payer: Self-pay | Admitting: Cardiology

## 2019-05-17 DIAGNOSIS — I4891 Unspecified atrial fibrillation: Secondary | ICD-10-CM

## 2019-05-18 ENCOUNTER — Encounter (HOSPITAL_COMMUNITY): Payer: Self-pay | Admitting: Cardiology

## 2019-05-18 ENCOUNTER — Other Ambulatory Visit: Payer: Self-pay

## 2019-05-18 ENCOUNTER — Ambulatory Visit (HOSPITAL_COMMUNITY)
Admission: RE | Admit: 2019-05-18 | Discharge: 2019-05-18 | Disposition: A | Discharge status: Home / Self Care | Payer: PPO | Attending: Cardiology | Admitting: Cardiology

## 2019-05-18 ENCOUNTER — Ambulatory Visit (HOSPITAL_COMMUNITY): Payer: PPO | Admitting: Anesthesiology

## 2019-05-18 ENCOUNTER — Encounter (HOSPITAL_COMMUNITY)
Admission: RE | Disposition: A | Discharge status: Home / Self Care | Payer: Self-pay | Source: Home / Self Care | Attending: Cardiology

## 2019-05-18 DIAGNOSIS — C679 Malignant neoplasm of bladder, unspecified: Secondary | ICD-10-CM | POA: Diagnosis not present

## 2019-05-18 DIAGNOSIS — E876 Hypokalemia: Secondary | ICD-10-CM | POA: Diagnosis not present

## 2019-05-18 DIAGNOSIS — I4891 Unspecified atrial fibrillation: Secondary | ICD-10-CM

## 2019-05-18 DIAGNOSIS — I110 Hypertensive heart disease with heart failure: Secondary | ICD-10-CM | POA: Insufficient documentation

## 2019-05-18 DIAGNOSIS — Z7901 Long term (current) use of anticoagulants: Secondary | ICD-10-CM | POA: Diagnosis not present

## 2019-05-18 DIAGNOSIS — E785 Hyperlipidemia, unspecified: Secondary | ICD-10-CM | POA: Diagnosis not present

## 2019-05-18 DIAGNOSIS — I4819 Other persistent atrial fibrillation: Secondary | ICD-10-CM

## 2019-05-18 DIAGNOSIS — Z96643 Presence of artificial hip joint, bilateral: Secondary | ICD-10-CM | POA: Diagnosis not present

## 2019-05-18 DIAGNOSIS — R7303 Prediabetes: Secondary | ICD-10-CM | POA: Diagnosis not present

## 2019-05-18 DIAGNOSIS — I509 Heart failure, unspecified: Secondary | ICD-10-CM | POA: Insufficient documentation

## 2019-05-18 DIAGNOSIS — Z8249 Family history of ischemic heart disease and other diseases of the circulatory system: Secondary | ICD-10-CM | POA: Diagnosis not present

## 2019-05-18 DIAGNOSIS — I340 Nonrheumatic mitral (valve) insufficiency: Secondary | ICD-10-CM | POA: Diagnosis not present

## 2019-05-18 DIAGNOSIS — Z9079 Acquired absence of other genital organ(s): Secondary | ICD-10-CM | POA: Diagnosis not present

## 2019-05-18 DIAGNOSIS — Z79899 Other long term (current) drug therapy: Secondary | ICD-10-CM | POA: Diagnosis not present

## 2019-05-18 DIAGNOSIS — M1990 Unspecified osteoarthritis, unspecified site: Secondary | ICD-10-CM | POA: Insufficient documentation

## 2019-05-18 DIAGNOSIS — D6959 Other secondary thrombocytopenia: Secondary | ICD-10-CM | POA: Insufficient documentation

## 2019-05-18 DIAGNOSIS — Z87891 Personal history of nicotine dependence: Secondary | ICD-10-CM | POA: Insufficient documentation

## 2019-05-18 DIAGNOSIS — D6481 Anemia due to antineoplastic chemotherapy: Secondary | ICD-10-CM | POA: Insufficient documentation

## 2019-05-18 DIAGNOSIS — J9601 Acute respiratory failure with hypoxia: Secondary | ICD-10-CM | POA: Diagnosis not present

## 2019-05-18 HISTORY — PX: CARDIOVERSION: SHX1299

## 2019-05-18 SURGERY — CARDIOVERSION
Anesthesia: General

## 2019-05-18 MED ORDER — SODIUM CHLORIDE 0.9 % IV SOLN: INTRAVENOUS | Status: DC

## 2019-05-18 MED ORDER — PROPOFOL 10 MG/ML IV BOLUS
INTRAVENOUS | Status: DC | PRN
  Administered 2019-05-18: 70 mg via INTRAVENOUS

## 2019-05-18 MED ORDER — LIDOCAINE 2% (20 MG/ML) 5 ML SYRINGE
INTRAMUSCULAR | Status: DC | PRN
  Administered 2019-05-18: 80 mg via INTRAVENOUS

## 2019-05-18 MED ORDER — PHENYLEPHRINE 40 MCG/ML (10ML) SYRINGE FOR IV PUSH (FOR BLOOD PRESSURE SUPPORT)
PREFILLED_SYRINGE | INTRAVENOUS | Status: DC | PRN
  Administered 2019-05-18: 120 ug via INTRAVENOUS

## 2019-05-18 NOTE — Transfer of Care (Signed)
Immediate Anesthesia Transfer of Care Note  Patient: Gaetana Michaelis  Procedure(s) Performed: CARDIOVERSION (N/A )  Patient Location: Endoscopy Unit  Anesthesia Type:General  Level of Consciousness: awake, alert  and oriented  Airway & Oxygen Therapy: Patient Spontanous Breathing  Post-op Assessment: Report given to RN and Post -op Vital signs reviewed and stable  Post vital signs: Reviewed and stable  Last Vitals:  Vitals Value Taken Time  BP 109/63 05/18/19 0852  Temp    Pulse 80 05/18/19 0853  Resp 20 05/18/19 0853  SpO2 96 % 05/18/19 0853    Last Pain:  Vitals:   05/18/19 0833  TempSrc: Oral  PainSc: 0-No pain         Complications: No apparent anesthesia complications

## 2019-05-18 NOTE — Discharge Instructions (Signed)
Electrical Cardioversion   What can I expect after the procedure?  Your blood pressure, heart rate, breathing rate, and blood oxygen level will be monitored until you leave the hospital or clinic.  Your heart rhythm will be watched to make sure it does not change.  You may have some redness on the skin where the shocks were given. Follow these instructions at home:  Do not drive for 24 hours if you were given a sedative during your procedure.  Take over-the-counter and prescription medicines only as told by your health care provider.  Ask your health care provider how to check your pulse. Check it often.  Rest for 48 hours after the procedure or as told by your health care provider.  Avoid or limit your caffeine use as told by your health care provider.  Keep all follow-up visits as told by your health care provider. This is important. Contact a health care provider if:  You feel like your heart is beating too quickly or your pulse is not regular.  You have a serious muscle cramp that does not go away. Get help right away if:  You have discomfort in your chest.  You are dizzy or you feel faint.  You have trouble breathing or you are short of breath.  Your speech is slurred.  You have trouble moving an arm or leg on one side of your body.  Your fingers or toes turn cold or blue. Summary  Electrical cardioversion is the delivery of a jolt of electricity to restore a normal rhythm to the heart.  This procedure may be done right away in an emergency or may be a scheduled procedure if the condition is not an emergency.  Generally, this is a safe procedure.  After the procedure, check your pulse often as told by your health care provider. This information is not intended to replace advice given to you by your health care provider. Make sure you discuss any questions you have with your health care provider. Document Revised: 10/30/2018 Document Reviewed:  10/30/2018 Elsevier Patient Education  2020 Elsevier Inc.  

## 2019-05-18 NOTE — Anesthesia Preprocedure Evaluation (Addendum)
Anesthesia Evaluation  Patient identified by MRN, date of birth, ID band Patient awake    Reviewed: Allergy & Precautions, NPO status , Patient's Chart, lab work & pertinent test results, reviewed documented beta blocker date and time   Airway Mallampati: II       Dental no notable dental hx.    Pulmonary former smoker,    Pulmonary exam normal        Cardiovascular hypertension, Pt. on medications and Pt. on home beta blockers Normal cardiovascular exam+ dysrhythmias Atrial Fibrillation  Rhythm:Regular Rate:Normal     Neuro/Psych negative neurological ROS  negative psych ROS   GI/Hepatic   Endo/Other    Renal/GU      Musculoskeletal   Abdominal (+) + obese,   Peds  Hematology  (+) Blood dyscrasia, anemia ,   Anesthesia Other Findings IMPRESSIONS    1. The left ventricle has normal systolic function, with an ejection  fraction of 55-60%.  2. Moderate mitral regurgitation.  3. No valvular vegetations seen.  4. 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   FINDINGS   Reproductive/Obstetrics                            Anesthesia Physical Anesthesia Plan  ASA: II  Anesthesia Plan: General   Post-op Pain Management:    Induction: Intravenous  PONV Risk Score and Plan: Propofol infusion and TIVA  Airway Management Planned: Mask and Natural Airway  Additional Equipment: None  Intra-op Plan:   Post-operative Plan:   Informed Consent: I have reviewed the patients History and Physical, chart, labs and discussed the procedure including the risks, benefits and alternatives for the proposed anesthesia with the patient or authorized representative who has indicated his/her understanding and acceptance.     Dental advisory given  Plan Discussed with: CRNA  Anesthesia Plan Comments:         Anesthesia Quick Evaluation

## 2019-05-18 NOTE — H&P (Signed)
Office Visit 04/10/2019  [facility]   Pixie Casino, MD Cardiology Atrial fibrillation with RVR ([place]) +2 more Dx Follow-up . Edema ; Referred by Shirline Frees, MD Reason for Visit     Additional Documentation Vitals:  BP 141/87 Pulse 107 Temp 97.5 F (36.4 C) Ht '6\' 2"'  (1.88 m)  Wt 121.1 kg  SpO2 94%  BMI 34.28 kg/m  BSA 2.51 m  More Vitals   Flowsheets:  Anthropometrics,  NEWS,  MEWS Score    Encounter Info:  Billing Info,  History,  Allergies,  Detailed Report          All Notes Progress Notes by Pixie Casino, MD at 04/10/2019 11:00 AM Author: Pixie Casino, MD Author Type: Physician Filed: 04/10/2019 4:43 PM  Note Status: Signed Cosign: Cosign Not Required Encounter Date: 04/10/2019  Editor: Pixie Casino, MD (Physician)      OFFICE NOTE  Chief Complaint:  Follow-up A. fib  Primary Care Physician:  Shirline Frees, MD  HPI:  [name] Rose is a 69 y.o. male with a past medial history significant for bladder cancer which is metastatic, recent admission for sepsis and MSSA bacteremia. He presents today to establish care with me as a new patient. Initially he was seen for A. fib with RVR by Dr. Einar Gip in the hospital. It was recommended that he go on to metoprolol and diltiazem for rate control, but rhythm control was not pursued as he could not be anticoagulated due to anemia and thrombocytopenia (a result of chemotherapy). During that admission he was seen by infectious diseases who recommended trans-esophageal echocardiogram given the fact that he has a chemotherapy port to rule out endocarditis. This was negative for endocarditis and did show moderate mitral regurgitation and no left atrial appendage thrombus. He wished to follow-up with [place] rather than Dr. Einar Gip to keep his care providers in the same system. Today he has no complaints. He denies any worsening shortness of breath or chest pain. He denies any palpitations. His  EKG shows sinus rhythm. Recent labs from a few days ago showed that he remains anemic with a hemoglobin of 8.7 and platelet count of 85,000.  04/10/2019  [name] Rose is seen today for follow-up. Over the summer he had a number of issues including some worsening heart failure as well as A. fib with RVR. Recently he saw Roby Lofts, PA-C, who consolidated his A. fib medications including the diltiazem for better rate control. Today presents in persistent A. fib with RVR at 110. Blood pressure is elevated. He saw his oncologist recently and lab work shows some improvement in his anemia and thrombocytopenia and he was advised that it was okay to start on Eliquis. Due to some confusion however he did not start that medication 3 weeks ago as recommended.  PMHx:       Past Medical History:  Diagnosis Date  . Arthritis   . 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 ([place]) 09/29/2016  . Bladder tumor   . Essential hypertension 06/23/2018  . Goals of care, counseling/discussion 09/30/2016  . History of prostate cancer followed by pcp dr [name]- per pt last PSA undetectable   dx 2008-- (Stage T1c, Gleason 3+3, PSA 4.58, vol 99cc) s/p radical prostatectomy (nerve sparing bilateral)   . Hyperglycemia 06/23/2018  . Hypertension   . Lower urinary tract symptoms (LUTS)   . Mild hyperlipidemia 06/23/2018  . Pre-diabetes   . Wears glasses  Past Surgical History:  Procedure Laterality Date  . CATARACT EXTRACTION W/ INTRAOCULAR LENS IMPLANT, BILATERAL Bilateral 2011  . [place]  . IR FLUORO GUIDE PORT INSERTION RIGHT  10/07/2016  . IR RADIOLOGIST EVAL & MGMT  01/05/2018  . IR US GUIDE VASC ACCESS RIGHT  10/07/2016  . KNEE ARTHROSCOPY Bilateral right 2006; left 02-15-2007  . [place]; 1990; 1983  . ROBOT ASSISTED LAPAROSCOPIC RADICAL PROSTATECTOMY  06/20/2006   bilateral nerve sparing  . TEE WITHOUT CARDIOVERSION N/A 06/27/2018   Procedure:  TRANSESOPHAGEAL ECHOCARDIOGRAM (TEE); Surgeon: Nigel Mormon, MD; Location: [facility]; Service: Cardiovascular; Laterality: N/A;  . TOTAL HIP ARTHROPLASTY Left 03/03/2015   Procedure: LEFT TOTAL HIP ARTHROPLASTY ANTERIOR APPROACH; Surgeon: Dorna Leitz, MD; Location: [facility]; Service: Orthopedics; Laterality: Left;  . TOTAL KNEE ARTHROPLASTY Bilateral left 08-13-2009; right 12-26-2009  . TRANSURETHRAL RESECTION OF BLADDER TUMOR N/A 09/20/2016   Procedure: TRANSURETHRAL RESECTION OF BLADDER TUMOR (TURBT); Surgeon: Franchot Gallo, MD; Location: [facility]; Service: Urology; Laterality: N/A;   FAMHx:          Family History   Problem Relation Age of Onset   . Hypertension Mother    . Aneurysm Mother    . Emphysema Father    . Hypertension Father     SOCHx:  reports that he quit smoking about 34 years ago. His smoking use included cigarettes. He quit after 16.00 years of use. He has never used smokeless tobacco. He reports current alcohol use. He reports that he does not use drugs.  ALLERGIES:  No Known Allergies  ROS:  A comprehensive review of systems was negative.  HOME MEDS:           Current Outpatient Medications on File Prior to Visit   Medication Sig Dispense Refill   . atorvastatin (LIPITOR) 40 MG tablet Take 40 mg by mouth daily.     . Blood Glucose Monitoring Suppl ([place]) w/Device KIT See admin instructions.     . gabapentin (NEURONTIN) 400 MG capsule TAKE 1 CAPSULE (400 MG TOTAL) BY MOUTH 4 (FOUR) TIMES DAILY. 360 capsule 2   . Hydrocortisone (GERHARDT'S BUTT CREAM) CREA Apply 1 application topically 2 (two) times daily. 1 each 0   . magnesium oxide (MAG-OX) 400 MG tablet TAKE 1 TABLET BY MOUTH TWICE A DAY FOR SUPPLEMENT     . metoprolol succinate (TOPROL-XL) 25 MG 24 hr tablet Take 25 mg by mouth at bedtime.     . metoprolol tartrate (LOPRESSOR) 25 MG tablet TAKE 1 TABLET BY MOUTH EVERY 6 HOURS AS NEEDED FOR HIGH HEART RATE (HR  100)     . OneTouch Delica Lancets 32P MISC 2 (two) times daily. as directed     . pyridoxine (B-6) 100 MG tablet Take 100 mg by mouth daily.     Marland Kitchen apixaban (ELIQUIS) 5 MG TABS tablet Take 1 tablet (5 mg total) by mouth 2 (two) times daily. (Patient not taking: Reported on 04/10/2019) 60 tablet 6   . ONETOUCH VERIO test strip 2 (two) times daily. for testing     . Potassium Chloride ER 20 MEQ TBCR TAKE 2 TABLETS BY MOUTH EVERY DAY FOR SUPPLEMENT     . traMADol (ULTRAM) 50 MG tablet Take 50 mg by mouth daily as needed.     . Vitamin D, Ergocalciferol, (DRISDOL) 1.25 MG (50000 UT) CAPS capsule TAKE 1 CAPSULE BY MOUTH EVERY SATURDAYS FOR SUPPLEMENT FOR 8 WEEKS  Current Facility-Administered Medications on File Prior to Visit   Medication Dose Route Frequency Provider Last Rate Last Admin   . sodium chloride flush (NS) 0.9 % injection 10 mL 10 mL Intravenous PRN [place], Holli Humbles, NP  10 mL at 02/21/17 1038   . sodium chloride flush (NS) 0.9 % injection 10 mL 10 mL Intravenous PRN Volanda Napoleon, MD  10 mL at 07/10/18 0844    LABS/IMAGING:  No results found for this or any previous visit (from the past 48 hour(s)).  No results found.  LIPID PANEL:                Component Value Date/Time     CHOL 159 10/05/2018 0411     CHOL 163 10/11/2016 0749     TRIG 131 10/05/2018 0411     TRIG 116 10/11/2016 0749     HDL 47 10/05/2018 0411     HDL 39 (L) 10/11/2016 0749     CHOLHDL 3.4 10/05/2018 0411     VLDL 26 10/05/2018 0411     LDLCALC 86 10/05/2018 0411     LDLCALC 101 (H) 10/11/2016 0749     WEIGHTS:          Wt Readings from Last 3 Encounters:    04/10/19 267 lb (121.1 kg)    03/19/19 258 lb (117 kg)    12/29/18 232 lb 6.4 oz (105.4 kg)     VITALS:  BP (!) 141/87  Pulse (!) 107  Temp (!) 97.5 F (36.4 C)  Ht '6\' 2"'  (1.88 m)  Wt 267 lb (121.1 kg)  SpO2 94%  BMI 34.28 kg/m  EXAM:  General appearance: alert and no distress  Neck: no carotid bruit, no JVD and  thyroid not enlarged, symmetric, no tenderness/mass/nodules  Lungs: clear to auscultation bilaterally  Heart: irregularly irregular rhythm, S1, S2 normal and systolic murmur: early systolic 2/6, blowing at apex  Abdomen: soft, non-tender; bowel sounds normal; no masses, no organomegaly  Extremities: extremities normal, atraumatic, no cyanosis or edema  Pulses: 2+ and symmetric  Skin: Skin color, texture, turgor normal. No rashes or lesions  Neurologic: Grossly normal  Psych: Pleasant  EKG:  A. fib with RVR at 110-personally reviewed  ASSESSMENT:  1. Paroxysmal atrial fibrillation-CHADSVASC score 3, not on anticoagulation due to anemia and thrombocytopenia 2. Metastatic bladder cancer 3. Anemia and thrombocytopenia related to chemotherapy 4. Recent MSSA sepsis without evidence of endocarditis or left atrial appendage thrombus 5. Normal LV function with moderate mitral regurgitation (06/2018) PLAN:  1. [name] Rose remains in A. fib with RVR. He needs better rate control and plan to increase his diltiazem further to 360 mg daily. He has not started Eliquis as recommended by his oncologist on March 19, 2019. I advised starting 5 mg twice daily of Eliquis and he will need a minimum of 3 weeks of anticoagulation before considering a cardioversion which we will try to arrange at the end of January. Plan follow-up with me afterwards.  Pixie Casino, MD, [facility], [place] Director of the Advanced Lipid Disorders &  Cardiovascular Risk Reduction Clinic  Diplomate of the American Board of Clinical Lipidology  Attending Cardiologist  Direct Dial: [phone]  Fax: [phone]  Website: [email or web address]  Nadean Corwin Hilty  04/10/2019, 4:40 PM     For DCCV; compliant with apixaban; no changes. Kirk Ruths

## 2019-05-18 NOTE — Procedures (Signed)
Electrical Cardioversion Procedure Note KAYHAN BOARDLEY [id number] 07-23-1950  Procedure: Electrical Cardioversion Indications:  Atrial Fibrillation  Procedure Details Consent: Risks of procedure as well as the alternatives and risks of each were explained to the (patient/caregiver).  Consent for procedure obtained. Time Out: Verified patient identification, verified procedure, site/side was marked, verified correct patient position, special equipment/implants available, medications/allergies/relevent history reviewed, required imaging and test results available.  Performed  Patient placed on cardiac monitor, pulse oximetry, supplemental oxygen as necessary.  Sedation given: Pt sedated by anesthesia with lidocaine 80 mg and diprovan 70 mg IV Pacer pads placed anterior and posterior chest.  Cardioverted 1 time(s).  Cardioverted at 120J.  Evaluation Findings: Post procedure EKG shows: NSR Complications: None Patient did tolerate procedure well.   Kirk Ruths 05/18/2019, 8:20 AM

## 2019-05-18 NOTE — Interval H&P Note (Signed)
History and Physical Interval Note:  05/18/2019 8:19 AM  [name] Rose  has presented today for surgery, with the diagnosis of atrial fibrillation.  The various methods of treatment have been discussed with the patient and family. After consideration of risks, benefits and other options for treatment, the patient has consented to  Procedure(s): CARDIOVERSION (N/A) as a surgical intervention.  The patient's history has been reviewed, patient examined, no change in status, stable for surgery.  I have reviewed the patient's chart and labs.  Questions were answered to the patient's satisfaction.     Kirk Ruths

## 2019-05-21 DIAGNOSIS — G629 Polyneuropathy, unspecified: Secondary | ICD-10-CM | POA: Diagnosis not present

## 2019-05-21 DIAGNOSIS — R5381 Other malaise: Secondary | ICD-10-CM | POA: Diagnosis not present

## 2019-05-21 DIAGNOSIS — Z7409 Other reduced mobility: Secondary | ICD-10-CM | POA: Diagnosis not present

## 2019-05-21 DIAGNOSIS — R2689 Other abnormalities of gait and mobility: Secondary | ICD-10-CM | POA: Diagnosis not present

## 2019-05-21 DIAGNOSIS — R531 Weakness: Secondary | ICD-10-CM | POA: Diagnosis not present

## 2019-05-23 DIAGNOSIS — R531 Weakness: Secondary | ICD-10-CM | POA: Diagnosis not present

## 2019-05-23 DIAGNOSIS — Z7409 Other reduced mobility: Secondary | ICD-10-CM | POA: Diagnosis not present

## 2019-05-23 DIAGNOSIS — R5381 Other malaise: Secondary | ICD-10-CM | POA: Diagnosis not present

## 2019-05-23 DIAGNOSIS — R2689 Other abnormalities of gait and mobility: Secondary | ICD-10-CM | POA: Diagnosis not present

## 2019-05-23 DIAGNOSIS — G629 Polyneuropathy, unspecified: Secondary | ICD-10-CM | POA: Diagnosis not present

## 2019-05-28 ENCOUNTER — Encounter: Payer: Self-pay | Admitting: Hematology & Oncology

## 2019-05-28 ENCOUNTER — Inpatient Hospital Stay: Payer: PPO

## 2019-05-28 ENCOUNTER — Other Ambulatory Visit: Payer: Self-pay

## 2019-05-28 ENCOUNTER — Inpatient Hospital Stay (HOSPITAL_BASED_OUTPATIENT_CLINIC_OR_DEPARTMENT_OTHER): Payer: PPO | Admitting: Hematology & Oncology

## 2019-05-28 ENCOUNTER — Telehealth: Payer: Self-pay | Admitting: Hematology & Oncology

## 2019-05-28 ENCOUNTER — Inpatient Hospital Stay: Payer: PPO | Attending: Hematology & Oncology

## 2019-05-28 VITALS — BP 131/78 | HR 86 | Temp 97.1°F | Resp 18 | Wt 273.0 lb

## 2019-05-28 DIAGNOSIS — Z79899 Other long term (current) drug therapy: Secondary | ICD-10-CM | POA: Insufficient documentation

## 2019-05-28 DIAGNOSIS — I4891 Unspecified atrial fibrillation: Secondary | ICD-10-CM | POA: Insufficient documentation

## 2019-05-28 DIAGNOSIS — K5780 Diverticulitis of intestine, part unspecified, with perforation and abscess without bleeding: Secondary | ICD-10-CM | POA: Diagnosis not present

## 2019-05-28 DIAGNOSIS — R319 Hematuria, unspecified: Secondary | ICD-10-CM | POA: Insufficient documentation

## 2019-05-28 DIAGNOSIS — D508 Other iron deficiency anemias: Secondary | ICD-10-CM

## 2019-05-28 DIAGNOSIS — G629 Polyneuropathy, unspecified: Secondary | ICD-10-CM | POA: Insufficient documentation

## 2019-05-28 DIAGNOSIS — C679 Malignant neoplasm of bladder, unspecified: Secondary | ICD-10-CM | POA: Diagnosis not present

## 2019-05-28 DIAGNOSIS — C772 Secondary and unspecified malignant neoplasm of intra-abdominal lymph nodes: Secondary | ICD-10-CM | POA: Diagnosis not present

## 2019-05-28 DIAGNOSIS — Z5189 Encounter for other specified aftercare: Secondary | ICD-10-CM | POA: Insufficient documentation

## 2019-05-28 DIAGNOSIS — C771 Secondary and unspecified malignant neoplasm of intrathoracic lymph nodes: Secondary | ICD-10-CM | POA: Insufficient documentation

## 2019-05-28 DIAGNOSIS — Z5111 Encounter for antineoplastic chemotherapy: Secondary | ICD-10-CM | POA: Diagnosis not present

## 2019-05-28 DIAGNOSIS — Z7901 Long term (current) use of anticoagulants: Secondary | ICD-10-CM | POA: Diagnosis not present

## 2019-05-28 LAB — CBC WITH DIFFERENTIAL (CANCER CENTER ONLY)
Abs Immature Granulocytes: 0.03 10*3/uL (ref 0.00–0.07)
Basophils Absolute: 0.1 10*3/uL (ref 0.0–0.1)
Basophils Relative: 1 %
Eosinophils Absolute: 0.1 10*3/uL (ref 0.0–0.5)
Eosinophils Relative: 1 %
HCT: 31.5 % — ABNORMAL LOW (ref 39.0–52.0)
Hemoglobin: 10.3 g/dL — ABNORMAL LOW (ref 13.0–17.0)
Immature Granulocytes: 0 %
Lymphocytes Relative: 17 %
Lymphs Abs: 1.2 10*3/uL (ref 0.7–4.0)
MCH: 31.3 pg (ref 26.0–34.0)
MCHC: 32.7 g/dL (ref 30.0–36.0)
MCV: 95.7 fL (ref 80.0–100.0)
Monocytes Absolute: 0.6 10*3/uL (ref 0.1–1.0)
Monocytes Relative: 9 %
Neutro Abs: 5.4 10*3/uL (ref 1.7–7.7)
Neutrophils Relative %: 72 %
Platelet Count: 178 10*3/uL (ref 150–400)
RBC: 3.29 MIL/uL — ABNORMAL LOW (ref 4.22–5.81)
RDW: 15.9 % — ABNORMAL HIGH (ref 11.5–15.5)
WBC Count: 7.4 10*3/uL (ref 4.0–10.5)
nRBC: 0 % (ref 0.0–0.2)

## 2019-05-28 LAB — CMP (CANCER CENTER ONLY)
ALT: 16 U/L (ref 0–44)
AST: 21 U/L (ref 15–41)
Albumin: 3.7 g/dL (ref 3.5–5.0)
Alkaline Phosphatase: 83 U/L (ref 38–126)
Anion gap: 8 (ref 5–15)
BUN: 33 mg/dL — ABNORMAL HIGH (ref 8–23)
CO2: 28 mmol/L (ref 22–32)
Calcium: 9.1 mg/dL (ref 8.9–10.3)
Chloride: 102 mmol/L (ref 98–111)
Creatinine: 1.94 mg/dL — ABNORMAL HIGH (ref 0.61–1.24)
GFR, Est AFR Am: 40 mL/min — ABNORMAL LOW (ref >60)
GFR, Estimated: 35 mL/min — ABNORMAL LOW (ref >60)
Glucose, Bld: 238 mg/dL — ABNORMAL HIGH (ref 70–99)
Potassium: 3.9 mmol/L (ref 3.5–5.1)
Sodium: 138 mmol/L (ref 135–145)
Total Bilirubin: 0.6 mg/dL (ref 0.3–1.2)
Total Protein: 5.7 g/dL — ABNORMAL LOW (ref 6.5–8.1)

## 2019-05-28 LAB — FERRITIN: Ferritin: 1129 ng/mL — ABNORMAL HIGH (ref 24–336)

## 2019-05-28 LAB — IRON AND TIBC
Iron: 68 ug/dL (ref 42–163)
Saturation Ratios: 28 % (ref 20–55)
TIBC: 246 ug/dL (ref 202–409)
UIBC: 178 ug/dL (ref 117–376)

## 2019-05-28 LAB — LACTATE DEHYDROGENASE: LDH: 206 U/L — ABNORMAL HIGH (ref 98–192)

## 2019-05-28 MED ORDER — SODIUM CHLORIDE 0.9 % IV SOLN
Freq: Once | INTRAVENOUS | Status: AC
  Filled 2019-05-28: qty 250

## 2019-05-28 MED ORDER — DEXAMETHASONE SODIUM PHOSPHATE 10 MG/ML IJ SOLN
INTRAMUSCULAR | Status: AC
  Filled 2019-05-28: qty 1

## 2019-05-28 MED ORDER — HEPARIN SOD (PORK) LOCK FLUSH 100 UNIT/ML IV SOLN
500.0000 [IU] | Freq: Once | INTRAVENOUS | Status: AC | PRN
  Administered 2019-05-28: 500 [IU]
  Filled 2019-05-28: qty 5

## 2019-05-28 MED ORDER — SODIUM CHLORIDE 0.9% FLUSH
10.0000 mL | INTRAVENOUS | Status: DC | PRN
  Administered 2019-05-28: 14:00:00 10 mL
  Filled 2019-05-28: qty 10

## 2019-05-28 MED ORDER — PALONOSETRON HCL INJECTION 0.25 MG/5ML
INTRAVENOUS | Status: AC
  Filled 2019-05-28: qty 5

## 2019-05-28 MED ORDER — SODIUM CHLORIDE 0.9 % IV SOLN: 510.0000 mg | Freq: Once | INTRAVENOUS | Status: DC

## 2019-05-28 MED ORDER — SODIUM CHLORIDE 0.9 % IV SOLN
400.0000 mg/m2 | Freq: Once | INTRAVENOUS | Status: AC
  Administered 2019-05-28: 11:00:00 1000 mg via INTRAVENOUS
  Filled 2019-05-28: qty 10

## 2019-05-28 MED ORDER — DEXAMETHASONE SODIUM PHOSPHATE 10 MG/ML IJ SOLN
10.0000 mg | Freq: Once | INTRAMUSCULAR | Status: AC
  Administered 2019-05-28: 10:00:00 10 mg via INTRAVENOUS

## 2019-05-28 MED ORDER — PALONOSETRON HCL INJECTION 0.25 MG/5ML
0.2500 mg | Freq: Once | INTRAVENOUS | Status: AC
  Administered 2019-05-28: 0.25 mg via INTRAVENOUS

## 2019-05-28 MED ORDER — SODIUM CHLORIDE 0.9 % IV SOLN
500.0000 mg/m2 | Freq: Once | INTRAVENOUS | Status: AC
  Administered 2019-05-28: 10:00:00 1254 mg via INTRAVENOUS
  Filled 2019-05-28: qty 26.3

## 2019-05-28 MED ORDER — SODIUM CHLORIDE 0.9 % IV SOLN
Freq: Once | INTRAVENOUS | Status: AC
  Filled 2019-05-28: qty 60

## 2019-05-28 NOTE — Patient Instructions (Signed)

## 2019-05-28 NOTE — Progress Notes (Signed)
Ok to treat with creatinine of 1.94 per Dr Marin Olp. dph

## 2019-05-28 NOTE — Progress Notes (Signed)
Hematology and Oncology Follow Up Visit  [name] Rose [id number] 1950/07/16 69 y.o. 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 Taxotere 812mm2 IV q 3 wks - s/p cycle #3 - d/c due to progression  Current Therapy:   M-VAC - s/p cycle #8  -- d/c on 07/10/2018 due to blood counts Padcev -- s/p cycle #1 on 07/17/2018 Ifosfamide/Gemzar -- s/p cycle # 2 - started 04/16/2019   Interim History: [name] Rose here today for follow-up.  So far, he is doing all right.  He still has the neuropathy.  The gabapentin seems to help a little bit.  He still has some hematuria.  He has seen urology.  He has had a cystoscopy.  Not sure exactly what was found.  He is still on Eliquis.  He was cardioverted but when I listen to his heart today, he was still in atrial fibrillation.  He has had no problems with pain.  His appetite is good.  He has had no nausea or vomiting.  There is been no rashes.  Overall, I would say his performance status is ECOG 1.    Medications:  Allergies as of 05/28/2019   No Known Allergies     Medication List       Accurate as of May 28, 2019  8:54 AM. If you have any questions, ask your nurse or doctor.        amoxicillin 500 MG capsule Commonly known as: AMOXIL SMARTSIG:4 Capsule(s) By Mouth Once   apixaban 5 MG Tabs tablet Commonly known as: Eliquis Take 1 tablet (5 mg total) by mouth 2 (two) times daily.   atorvastatin 40 MG tablet Commonly known as: LIPITOR Take 40 mg by mouth every evening.   dexamethasone 4 MG tablet Commonly known as: DECADRON Take 2 tablets (8 mg total) by mouth daily. Start the day after chemotherapy for 2 days. Take with food. What changed: when to take this   diltiazem 360 MG 24 hr capsule Commonly known as: Cardizem CD Take 1 capsule (360 mg total) by  mouth daily. What changed: when to take this   fluconazole 100 MG tablet Commonly known as: DIFLUCAN Take 100 mg by mouth at bedtime.   gabapentin 400 MG capsule Commonly known as: NEURONTIN TAKE 1 CAPSULE (400 MG TOTAL) BY MOUTH 4 (FOUR) TIMES DAILY. What changed:   how much to take  when to take this  additional instructions   LORazepam 0.5 MG tablet Commonly known as: Ativan Take 1 tablet (0.5 mg total) by mouth every 6 (six) hours as needed (Nausea or vomiting).   magnesium oxide 400 MG tablet Commonly known as: MAG-OX Take 400 mg by mouth 2 (two) times daily.   metoprolol succinate 25 MG 24 hr tablet Commonly known as: TOPROL-XL Take 25 mg by mouth at bedtime.   ondansetron 8 MG tablet Commonly known as: Zofran Take 1 tablet (8 mg total) by mouth 2 (two) times daily as needed for refractory nausea / vomiting. Start 2 days after chemotherapy.   OneTouch Delica Lancets 3376Eisc 2 (two) times daily. as directed   [place]/Device Kit See admin instructions.   OneTouch Verio test strip Generic drug: glucose blood 2 (two) times daily. for testing   Potassium Chloride ER 20 MEQ Tbcr Take 20 mEq by mouth daily with breakfast.  prochlorperazine 10 MG tablet Commonly known as: COMPAZINE Take 1 tablet (10 mg total) by mouth every 6 (six) hours as needed (Nausea or vomiting).   pyridoxine 100 MG tablet Commonly known as: B-6 Take 200 mg by mouth daily with breakfast.   traMADol 50 MG tablet Commonly known as: ULTRAM Take 50 mg by mouth daily as needed (pain.).   Vitamin D (Ergocalciferol) 1.25 MG (50000 UNIT) Caps capsule Commonly known as: DRISDOL Take 50,000 Units by mouth every Saturday.       Allergies: No Known Allergies  Past Medical History, Surgical history, Social history, and Family History were reviewed and updated.  Review of Systems: Review of Systems  Constitutional: Negative.   HENT: Negative.   Eyes: Negative.     Respiratory: Negative.   Cardiovascular: Negative.   Gastrointestinal: Negative.   Genitourinary: Negative.   Musculoskeletal: Negative.   Skin: Negative.   Neurological: Positive for tingling.  Endo/Heme/Allergies: Negative.   Psychiatric/Behavioral: Negative.      Physical Exam:  vitals were not taken for this visit.   Wt Readings from Last 3 Encounters:  05/07/19 270 lb (122.5 kg)  04/17/19 271 lb 4 oz (123 kg)  04/10/19 267 lb (121.1 kg)    Physical Exam Vitals reviewed.  HENT:     Head: Normocephalic and atraumatic.  Eyes:     Pupils: Pupils are equal, round, and reactive to light.  Cardiovascular:     Comments: Cardiac exam shows an irregular rate and irregular rhythm consistent with atrial fibrillation.  The rate is fairly well controlled.  I do not detect any murmurs, rubs or bruits. Pulmonary:     Effort: Pulmonary effort is normal.     Breath sounds: Normal breath sounds.  Abdominal:     General: Bowel sounds are normal.     Palpations: Abdomen is soft.  Musculoskeletal:        General: No tenderness or deformity. Normal range of motion.     Cervical back: Normal range of motion.  Lymphadenopathy:     Cervical: No cervical adenopathy.  Skin:    General: Skin is warm and dry.     Findings: No erythema or rash.  Neurological:     Mental Status: He is alert and oriented to person, place, and time.  Psychiatric:        Behavior: Behavior normal.        Thought Content: Thought content normal.        Judgment: Judgment normal.      Lab Results  Component Value Date   WBC 7.4 05/28/2019   HGB 10.3 (L) 05/28/2019   HCT 31.5 (L) 05/28/2019   MCV 95.7 05/28/2019   PLT 178 05/28/2019   Lab Results  Component Value Date   FERRITIN 1,943 (H) 05/07/2019   IRON 39 (L) 05/07/2019   TIBC 204 05/07/2019   UIBC 165 05/07/2019   IRONPCTSAT 19 (L) 05/07/2019   Lab Results  Component Value Date   RETICCTPCT 3.7 (H) 09/18/2018   RBC 3.29 (L) 05/28/2019    No results found for: KPAFRELGTCHN, LAMBDASER, KAPLAMBRATIO No results found for: IGGSERUM, IGA, IGMSERUM No results found for: Ronnald Ramp, A1GS, A2GS, Violet Baldy, MSPIKE, SPEI   Chemistry      Component Value Date/Time   NA 140 05/15/2019 1109   NA 141 02/21/2017 0902   K 4.0 05/15/2019 1109   K 4.1 02/21/2017 0902   CL 100 05/15/2019 1109   CL 98 02/21/2017 0902   CO2  25 05/15/2019 1109   CO2 31 02/21/2017 0902   BUN 36 (H) 05/15/2019 1109   BUN 17 02/21/2017 0902   CREATININE 1.73 (H) 05/15/2019 1109   CREATININE 1.92 (H) 05/07/2019 0843   CREATININE 1.2 02/21/2017 0902      Component Value Date/Time   CALCIUM 9.1 05/15/2019 1109   CALCIUM 9.2 02/21/2017 0902   ALKPHOS 126 05/07/2019 0843   ALKPHOS 65 02/21/2017 0902   AST 63 (H) 05/07/2019 0843   ALT 78 (H) 05/07/2019 0843   ALT 18 02/21/2017 0902   BILITOT 0.5 05/07/2019 0843       Impression and Plan: [name] Rose is a very pleasant 69 year old Caucasian gentleman with metastatic high grade bladder cancer.   His labs look pretty good.  His blood sugar is up a little bit.  His renal function is down a tad.  His renal function is a little bit down but clearly not any worse.  His liver function studies are back to normal which is good to see.  He does have a little bit of iron deficiency.  I will go ahead and give him a dose of IV iron today.  He will set up for his next PET scan after this cycle of treatment.  Hopefully, we will see that he is responding.   Volanda Napoleon, MD 2/15/20218:54 AM

## 2019-05-28 NOTE — Telephone Encounter (Signed)
Appointments scheduled while patient was in the office and he will get updated AVS w/ appts before he leaves today/ 2/15 los

## 2019-05-28 NOTE — Progress Notes (Signed)
Per Pharmacy run IF at 500cc/hr to push hydration, ok per MD

## 2019-05-28 NOTE — Patient Instructions (Signed)
Ifosfamide injection What is this medicine? IFOSFAMIDE (eye FOS fa mide) is a chemotherapy drug. It slows the growth of cancer cells. This medicine is used to treat testicular cancer and other cancers. This medicine may be used for other purposes; ask your health care provider or pharmacist if you have questions. COMMON BRAND NAME(S): Ifex, Ifex/Mesna What should I tell my health care provider before I take this medicine? They need to know if you have any of these conditions:  heart disease  history of irregular heartbeat  immune system problems  infection (especially a virus infection such as chickenpox, cold sores, or herpes)  infections in the bladder, kidneys, or urinary tract  kidney disease  low blood counts, like low white cell, platelet, or red cell counts  lung or breathing disease, like asthma  problems urinating  recent or ongoing radiation therapy  tingling of the fingers or toes, or other nerve disorder  an unusual or allergic reaction to ifosfamide, other medicines, foods, dyes, or preservatives  pregnant or trying to get pregnant  breast-feeding How should I use this medicine? This drug is given as an infusion into a vein. It is administered in a hospital or clinic by a specially trained health care professional. Talk to your pediatrician regarding the use of this medicine in children. Special care may be needed. Overdosage: If you think you have taken too much of this medicine contact a poison control center or emergency room at once. NOTE: This medicine is only for you. Do not share this medicine with others. What if I miss a dose? It is important not to miss your dose. Call your doctor or health care professional if you are unable to keep an appointment. What may interact with this medicine? This medicine may interact with the following medications:  aprepitant, fosaprepitant  certain medicines for fungal infections like fluconazole, itraconazole, and  ketoconazole  certain medicines for seizures like carbamazepine, phenobarbital, phenytoin  grapefruit, grapefruit juice  rifampin  [place] Wort This list may not describe all possible interactions. Give your health care provider a list of all the medicines, herbs, non-prescription drugs, or dietary supplements you use. Also tell them if you smoke, drink alcohol, or use illegal drugs. Some items may interact with your medicine. What should I watch for while using this medicine? Visit your doctor for checks on your progress. This drug may make you feel generally unwell. This is not uncommon, as chemotherapy can affect healthy cells as well as cancer cells. Report any side effects. Continue your course of treatment even though you feel ill unless your doctor tells you to stop. Drink water or other fluids as directed. Urinate often, even at night. In some cases, you may be given additional medicines to help with side effects. Follow all directions for their use. Call your doctor or health care professional for advice if you get a fever, chills or sore throat, or other symptoms of a cold or flu. Do not treat yourself. This drug decreases your body's ability to fight infections. Try to avoid being around people who are sick. This medicine may increase your risk to bruise or bleed. Call your doctor or health care professional if you notice any unusual bleeding. Be careful brushing and flossing your teeth or using a toothpick because you may get an infection or bleed more easily. If you have any dental work done, tell your dentist you are receiving this medicine. Avoid taking products that contain aspirin, acetaminophen, ibuprofen, naproxen, or ketoprofen unless  instructed by your doctor. These medicines may hide a fever. Do not become pregnant while taking this medicine. Women should inform their doctor if they wish to become pregnant or think they might be pregnant. Men should not father a child while  taking this medicine and for 6 months after stopping it. There is a potential for serious side effects to an unborn child. Talk to your health care professional or pharmacist for more information. Do not breast-feed an infant while taking this medicine. This medicine has caused ovarian failure in some women. This medicine may interfere with the ability to have a child. Talk with your doctor or health care professional if you are concerned about your fertility. This medicine has caused reduced sperm counts in some men. This may interfere with the ability to father a child. You should talk to your doctor or health care professional if you are concerned about your fertility. What side effects may I notice from receiving this medicine? Side effects that you should report to your doctor or health care professional as soon as possible:  allergic reactions like skin rash, itching or hives, swelling of the face, lips, or tongue  breathing problems  chest pain or palpitations  confusion  dizziness  hallucination, loss of contact with reality  loss of balance or coordination  nausea, vomiting  pain, tingling, numbness in the hands or feet  seizures  signs of decreased platelets or bleeding - bruising, pinpoint red spots on the skin, black, tarry stools, blood in the urine  signs of decreased red blood cells - unusually weak or tired, feeling faint or lightheaded, falls  signs and symptoms of infection like fever or chills; cough; sore throat; pain or trouble passing urine  signs and symptoms of kidney injury like trouble passing urine or change in the amount of urine  signs and symptoms of liver injury like dark yellow or brown urine; general ill feeling or flu-like symptoms; light-colored stools; loss of appetite; nausea; right upper belly pain; unusually weak or tired; yellowing of the eyes or skin  swelling of the legs or ankles  tremors Side effects that usually do not require  medical attention (report to your doctor or health care professional if they continue or are bothersome):  changes in vision  constipation  decreased hearing  diarrhea  hair loss  headache  loss of appetite  loss of memory  mouth sores  rash This list may not describe all possible side effects. Call your doctor for medical advice about side effects. You may report side effects to FDA at 1-800-FDA-1088. Where should I keep my medicine? This drug is given in a hospital or clinic and will not be stored at home. NOTE: This sheet is a summary. It may not cover all possible information. If you have questions about this medicine, talk to your doctor, pharmacist, or health care provider.  2020 [employer]/Gold Standard (2016-11-22 09:57:40) Gemcitabine injection What is this medicine? GEMCITABINE (jem SYE ta been) is a chemotherapy drug. This medicine is used to treat many types of cancer like breast cancer, lung cancer, pancreatic cancer, and ovarian cancer. This medicine may be used for other purposes; ask your health care provider or pharmacist if you have questions. COMMON BRAND NAME(S): Gemzar, Infugem What should I tell my health care provider before I take this medicine? They need to know if you have any of these conditions:  blood disorders  infection  kidney disease  liver disease  lung or breathing disease, like asthma  recent or ongoing radiation therapy  an unusual or allergic reaction to gemcitabine, other chemotherapy, other medicines, foods, dyes, or preservatives  pregnant or trying to get pregnant  breast-feeding How should I use this medicine? This drug is given as an infusion into a vein. It is administered in a hospital or clinic by a specially trained health care professional. Talk to your pediatrician regarding the use of this medicine in children. Special care may be needed. Overdosage: If you think you have taken too much of this medicine contact a  poison control center or emergency room at once. NOTE: This medicine is only for you. Do not share this medicine with others. What if I miss a dose? It is important not to miss your dose. Call your doctor or health care professional if you are unable to keep an appointment. What may interact with this medicine?  medicines to increase blood counts like filgrastim, pegfilgrastim, sargramostim  some other chemotherapy drugs like cisplatin  vaccines Talk to your doctor or health care professional before taking any of these medicines:  acetaminophen  aspirin  ibuprofen  ketoprofen  naproxen This list may not describe all possible interactions. Give your health care provider a list of all the medicines, herbs, non-prescription drugs, or dietary supplements you use. Also tell them if you smoke, drink alcohol, or use illegal drugs. Some items may interact with your medicine. What should I watch for while using this medicine? Visit your doctor for checks on your progress. This drug may make you feel generally unwell. This is not uncommon, as chemotherapy can affect healthy cells as well as cancer cells. Report any side effects. Continue your course of treatment even though you feel ill unless your doctor tells you to stop. In some cases, you may be given additional medicines to help with side effects. Follow all directions for their use. Call your doctor or health care professional for advice if you get a fever, chills or sore throat, or other symptoms of a cold or flu. Do not treat yourself. This drug decreases your body's ability to fight infections. Try to avoid being around people who are sick. This medicine may increase your risk to bruise or bleed. Call your doctor or health care professional if you notice any unusual bleeding. Be careful brushing and flossing your teeth or using a toothpick because you may get an infection or bleed more easily. If you have any dental work done, tell your  dentist you are receiving this medicine. Avoid taking products that contain aspirin, acetaminophen, ibuprofen, naproxen, or ketoprofen unless instructed by your doctor. These medicines may hide a fever. Do not become pregnant while taking this medicine or for 6 months after stopping it. Women should inform their doctor if they wish to become pregnant or think they might be pregnant. Men should not father a child while taking this medicine and for 3 months after stopping it. There is a potential for serious side effects to an unborn child. Talk to your health care professional or pharmacist for more information. Do not breast-feed an infant while taking this medicine or for at least 1 week after stopping it. Men should inform their doctors if they wish to father a child. This medicine may lower sperm counts. Talk with your doctor or health care professional if you are concerned about your fertility. What side effects may I notice from receiving this medicine? Side effects that you should report to your doctor or health care professional as soon as possible:  allergic reactions like skin rash, itching or hives, swelling of the face, lips, or tongue  breathing problems  pain, redness, or irritation at site where injected  signs and symptoms of a dangerous change in heartbeat or heart rhythm like chest pain; dizziness; fast or irregular heartbeat; palpitations; feeling faint or lightheaded, falls; breathing problems  signs of decreased platelets or bleeding - bruising, pinpoint red spots on the skin, black, tarry stools, blood in the urine  signs of decreased red blood cells - unusually weak or tired, feeling faint or lightheaded, falls  signs of infection - fever or chills, cough, sore throat, pain or difficulty passing urine  signs and symptoms of kidney injury like trouble passing urine or change in the amount of urine  signs and symptoms of liver injury like dark yellow or brown urine; general  ill feeling or flu-like symptoms; light-colored stools; loss of appetite; nausea; right upper belly pain; unusually weak or tired; yellowing of the eyes or skin  swelling of ankles, feet, hands Side effects that usually do not require medical attention (report to your doctor or health care professional if they continue or are bothersome):  constipation  diarrhea  hair loss  loss of appetite  nausea  rash  vomiting This list may not describe all possible side effects. Call your doctor for medical advice about side effects. You may report side effects to FDA at 1-800-FDA-1088. Where should I keep my medicine? This drug is given in a hospital or clinic and will not be stored at home. NOTE: This sheet is a summary. It may not cover all possible information. If you have questions about this medicine, talk to your doctor, pharmacist, or health care provider.  2020 [employer]/Gold Standard (2017-06-22 18:06:11)

## 2019-05-29 ENCOUNTER — Inpatient Hospital Stay: Payer: PPO

## 2019-05-29 VITALS — BP 128/70 | HR 93 | Temp 96.8°F | Resp 18

## 2019-05-29 DIAGNOSIS — C679 Malignant neoplasm of bladder, unspecified: Secondary | ICD-10-CM

## 2019-05-29 DIAGNOSIS — Z5111 Encounter for antineoplastic chemotherapy: Secondary | ICD-10-CM | POA: Diagnosis not present

## 2019-05-29 DIAGNOSIS — C772 Secondary and unspecified malignant neoplasm of intra-abdominal lymph nodes: Secondary | ICD-10-CM

## 2019-05-29 DIAGNOSIS — D508 Other iron deficiency anemias: Secondary | ICD-10-CM

## 2019-05-29 MED ORDER — DEXAMETHASONE SODIUM PHOSPHATE 10 MG/ML IJ SOLN
10.0000 mg | Freq: Once | INTRAMUSCULAR | Status: AC
  Administered 2019-05-29: 09:00:00 10 mg via INTRAVENOUS

## 2019-05-29 MED ORDER — SODIUM CHLORIDE 0.9 % IV SOLN
Freq: Once | INTRAVENOUS | Status: AC
  Filled 2019-05-29: qty 60

## 2019-05-29 MED ORDER — DEXAMETHASONE SODIUM PHOSPHATE 10 MG/ML IJ SOLN
INTRAMUSCULAR | Status: AC
  Filled 2019-05-29: qty 1

## 2019-05-29 MED ORDER — SODIUM CHLORIDE 0.9 % IV SOLN
150.0000 mg | Freq: Once | INTRAVENOUS | Status: AC
  Administered 2019-05-29: 150 mg via INTRAVENOUS
  Filled 2019-05-29: qty 150

## 2019-05-29 MED ORDER — SODIUM CHLORIDE 0.9 % IV SOLN
Freq: Once | INTRAVENOUS | Status: AC
  Filled 2019-05-29: qty 250

## 2019-05-29 MED ORDER — SODIUM CHLORIDE 0.9% FLUSH
10.0000 mL | INTRAVENOUS | Status: DC | PRN
  Administered 2019-05-29: 14:00:00 10 mL
  Filled 2019-05-29: qty 10

## 2019-05-29 MED ORDER — HEPARIN SOD (PORK) LOCK FLUSH 100 UNIT/ML IV SOLN
500.0000 [IU] | Freq: Once | INTRAVENOUS | Status: AC | PRN
  Administered 2019-05-29: 14:00:00 500 [IU]
  Filled 2019-05-29: qty 5

## 2019-05-29 MED ORDER — SODIUM CHLORIDE 0.9 % IV SOLN
400.0000 mg/m2 | Freq: Once | INTRAVENOUS | Status: AC
  Administered 2019-05-29: 10:00:00 1000 mg via INTRAVENOUS
  Filled 2019-05-29: qty 10

## 2019-05-29 MED ORDER — SODIUM CHLORIDE 0.9 % IV SOLN
510.0000 mg | Freq: Once | INTRAVENOUS | Status: AC
  Administered 2019-05-29: 09:00:00 510 mg via INTRAVENOUS
  Filled 2019-05-29: qty 17

## 2019-05-29 NOTE — Patient Instructions (Signed)
[facility] Discharge Instructions for Patients Receiving Chemotherapy  Today you received the following chemotherapy agents Ifex, Mesna  To help prevent nausea and vomiting after your treatment, we encourage you to take your nausea medication    If you develop nausea and vomiting that is not controlled by your nausea medication, call the clinic.   BELOW ARE SYMPTOMS THAT SHOULD BE REPORTED IMMEDIATELY:  *FEVER GREATER THAN 100.5 F  *CHILLS WITH OR WITHOUT FEVER  NAUSEA AND VOMITING THAT IS NOT CONTROLLED WITH YOUR NAUSEA MEDICATION  *UNUSUAL SHORTNESS OF BREATH  *UNUSUAL BRUISING OR BLEEDING  TENDERNESS IN MOUTH AND THROAT WITH OR WITHOUT PRESENCE OF ULCERS  *URINARY PROBLEMS  *BOWEL PROBLEMS  UNUSUAL RASH Items with * indicate a potential emergency and should be followed up as soon as possible.  Feel free to call the clinic should you have any questions or concerns. The clinic phone number is [phone].  Please show the [facility] at check-in to the Emergency Department and triage nurse.

## 2019-05-30 ENCOUNTER — Other Ambulatory Visit: Payer: Self-pay

## 2019-05-30 ENCOUNTER — Inpatient Hospital Stay: Payer: PPO

## 2019-05-30 VITALS — BP 129/72 | HR 91 | Temp 96.9°F | Resp 16

## 2019-05-30 DIAGNOSIS — Z5111 Encounter for antineoplastic chemotherapy: Secondary | ICD-10-CM | POA: Diagnosis not present

## 2019-05-30 DIAGNOSIS — C772 Secondary and unspecified malignant neoplasm of intra-abdominal lymph nodes: Secondary | ICD-10-CM

## 2019-05-30 DIAGNOSIS — C679 Malignant neoplasm of bladder, unspecified: Secondary | ICD-10-CM

## 2019-05-30 MED ORDER — SODIUM CHLORIDE 0.9 % IV SOLN
400.0000 mg/m2 | Freq: Once | INTRAVENOUS | Status: AC
  Administered 2019-05-30: 09:00:00 1000 mg via INTRAVENOUS
  Filled 2019-05-30: qty 10

## 2019-05-30 MED ORDER — DEXAMETHASONE SODIUM PHOSPHATE 10 MG/ML IJ SOLN
INTRAMUSCULAR | Status: AC
  Filled 2019-05-30: qty 1

## 2019-05-30 MED ORDER — DEXAMETHASONE SODIUM PHOSPHATE 10 MG/ML IJ SOLN
10.0000 mg | Freq: Once | INTRAMUSCULAR | Status: AC
  Administered 2019-05-30: 09:00:00 10 mg via INTRAVENOUS

## 2019-05-30 MED ORDER — HEPARIN SOD (PORK) LOCK FLUSH 100 UNIT/ML IV SOLN
500.0000 [IU] | Freq: Once | INTRAVENOUS | Status: AC | PRN
  Administered 2019-05-30: 12:00:00 500 [IU]
  Filled 2019-05-30: qty 5

## 2019-05-30 MED ORDER — SODIUM CHLORIDE 0.9 % IV SOLN
Freq: Once | INTRAVENOUS | Status: AC
  Filled 2019-05-30: qty 60

## 2019-05-30 MED ORDER — SODIUM CHLORIDE 0.9% FLUSH
10.0000 mL | INTRAVENOUS | Status: DC | PRN
  Administered 2019-05-30: 12:00:00 10 mL
  Filled 2019-05-30: qty 10

## 2019-05-30 MED ORDER — PALONOSETRON HCL INJECTION 0.25 MG/5ML
0.2500 mg | Freq: Once | INTRAVENOUS | Status: AC
  Administered 2019-05-30: 0.25 mg via INTRAVENOUS

## 2019-05-30 MED ORDER — PALONOSETRON HCL INJECTION 0.25 MG/5ML
INTRAVENOUS | Status: AC
  Filled 2019-05-30: qty 5

## 2019-05-30 MED ORDER — SODIUM CHLORIDE 0.9 % IV SOLN
Freq: Once | INTRAVENOUS | Status: AC
  Filled 2019-05-30: qty 250

## 2019-05-30 MED ORDER — PEGFILGRASTIM 6 MG/0.6ML ~~LOC~~ PSKT
PREFILLED_SYRINGE | SUBCUTANEOUS | Status: AC
  Filled 2019-05-30: qty 0.6

## 2019-05-30 MED ORDER — PEGFILGRASTIM 6 MG/0.6ML ~~LOC~~ PSKT
6.0000 mg | PREFILLED_SYRINGE | Freq: Once | SUBCUTANEOUS | Status: AC
  Administered 2019-05-30: 12:00:00 6 mg via SUBCUTANEOUS

## 2019-05-30 NOTE — Patient Instructions (Signed)
Pegfilgrastim injection What is this medicine? PEGFILGRASTIM (PEG fil gra stim) is a long-acting granulocyte colony-stimulating factor that stimulates the growth of neutrophils, a type of white blood cell important in the body's fight against infection. It is used to reduce the incidence of fever and infection in patients with certain types of cancer who are receiving chemotherapy that affects the bone marrow, and to increase survival after being exposed to high doses of radiation. This medicine may be used for other purposes; ask your health care provider or pharmacist if you have questions. COMMON BRAND NAME(S): Steve Rattler, Ziextenzo What should I tell my health care provider before I take this medicine? They need to know if you have any of these conditions:  kidney disease  latex allergy  ongoing radiation therapy  sickle cell disease  skin reactions to acrylic adhesives (On-Body Injector only)  an unusual or allergic reaction to pegfilgrastim, filgrastim, other medicines, foods, dyes, or preservatives  pregnant or trying to get pregnant  breast-feeding How should I use this medicine? This medicine is for injection under the skin. If you get this medicine at home, you will be taught how to prepare and give the pre-filled syringe or how to use the On-body Injector. Refer to the patient Instructions for Use for detailed instructions. Use exactly as directed. Tell your healthcare provider immediately if you suspect that the On-body Injector may not have performed as intended or if you suspect the use of the On-body Injector resulted in a missed or partial dose. It is important that you put your used needles and syringes in a special sharps container. Do not put them in a trash can. If you do not have a sharps container, call your pharmacist or healthcare provider to get one. Talk to your pediatrician regarding the use of this medicine in children. While this drug may be  prescribed for selected conditions, precautions do apply. Overdosage: If you think you have taken too much of this medicine contact a poison control center or emergency room at once. NOTE: This medicine is only for you. Do not share this medicine with others. What if I miss a dose? It is important not to miss your dose. Call your doctor or health care professional if you miss your dose. If you miss a dose due to an On-body Injector failure or leakage, a new dose should be administered as soon as possible using a single prefilled syringe for manual use. What may interact with this medicine? Interactions have not been studied. Give your health care provider a list of all the medicines, herbs, non-prescription drugs, or dietary supplements you use. Also tell them if you smoke, drink alcohol, or use illegal drugs. Some items may interact with your medicine. This list may not describe all possible interactions. Give your health care provider a list of all the medicines, herbs, non-prescription drugs, or dietary supplements you use. Also tell them if you smoke, drink alcohol, or use illegal drugs. Some items may interact with your medicine. What should I watch for while using this medicine? You may need blood work done while you are taking this medicine. If you are going to need a MRI, CT scan, or other procedure, tell your doctor that you are using this medicine (On-Body Injector only). What side effects may I notice from receiving this medicine? Side effects that you should report to your doctor or health care professional as soon as possible:  allergic reactions like skin rash, itching or hives, swelling of the  face, lips, or tongue  back pain  dizziness  fever  pain, redness, or irritation at site where injected  pinpoint red spots on the skin  red or dark-brown urine  shortness of breath or breathing problems  stomach or side pain, or pain at the  shoulder  swelling  tiredness  trouble passing urine or change in the amount of urine Side effects that usually do not require medical attention (report to your doctor or health care professional if they continue or are bothersome):  bone pain  muscle pain This list may not describe all possible side effects. Call your doctor for medical advice about side effects. You may report side effects to FDA at 1-800-FDA-1088. Where should I keep my medicine? Keep out of the reach of children. If you are using this medicine at home, you will be instructed on how to store it. Throw away any unused medicine after the expiration date on the label. NOTE: This sheet is a summary. It may not cover all possible information. If you have questions about this medicine, talk to your doctor, pharmacist, or health care provider.  2020 [employer]/Gold Standard (2017-07-04 16:57:08) Mesna injection What is this medicine? MESNA (MES na) is used to prevent bleeding from the bladder during treatment with ifosfamide. This medicine does not reduce the chance of getting other side effects of cancer chemotherapy. This medicine may be used for other purposes; ask your health care provider or pharmacist if you have questions. COMMON BRAND NAME(S): Mesnex What should I tell my health care provider before I take this medicine? They need to know if you have any of these conditions:  autoimmune disease like lupus, nephritis, or rheumatoid arthritis  an unusual or allergic reaction to mesna, benzyl alcohol, sulfur medicines, other medicines, foods, dyes, or preservatives  pregnant or trying to get pregnant  breast-feeding How should I use this medicine? This medicine is for infusion into a vein. It is given by a health care professional in a hospital or clinic setting. Talk to your pediatrician regarding the use of this medicine in children. Special care may be needed. Overdosage: If you think you have taken too much of  this medicine contact a poison control center or emergency room at once. NOTE: This medicine is only for you. Do not share this medicine with others. What if I miss a dose? This does not apply. What may interact with this medicine? Interactions are not expected. This list may not describe all possible interactions. Give your health care provider a list of all the medicines, herbs, non-prescription drugs, or dietary supplements you use. Also tell them if you smoke, drink alcohol, or use illegal drugs. Some items may interact with your medicine. What should I watch for while using this medicine? Your doctor will follow your condition closely while you are taking this medicine. Tell your doctor right away if you see that your urine has turned a pink or red color. It is important to drink at least a quart (4 cups) of fluids each day that you take this medicine. This medicine may cause serious skin reactions. They can happen weeks to months after starting the medicine. Contact your health care provider right away if you notice fevers or flu-like symptoms with a rash. The rash may be red or purple and then turn into blisters or peeling of the skin. Or, you might notice a red rash with swelling of the face, lips or lymph nodes in your neck or under your arms. What  side effects may I notice from receiving this medicine? Side effects that you should report to your doctor or health care professional as soon as possible:  allergic reactions like skin rash, itching or hives, swelling of the face, lips, or tongue  breathing problems  blood in your urine or pink to red colored urine  fever, chills, or sore throat  flushing or redness to skin  mouth sores  pain or redness at site where injected  redness, blistering, peeling or loosening of the skin, including inside the mouth  swelling of ankles or feet  vomiting Side effects that usually do not require medical attention (report to your doctor or  health care professional if they continue or are bothersome):  aches and pains  bad taste in mouth  diarrhea  dizziness  hair loss  headache  nausea This list may not describe all possible side effects. Call your doctor for medical advice about side effects. You may report side effects to FDA at 1-800-FDA-1088. Where should I keep my medicine? This drug is given in a hospital or clinic and will not be stored at home. NOTE: This sheet is a summary. It may not cover all possible information. If you have questions about this medicine, talk to your doctor, pharmacist, or health care provider.  2020 [employer]/Gold Standard (2018-06-30 15:28:46) Ifosfamide injection What is this medicine? IFOSFAMIDE (eye FOS fa mide) is a chemotherapy drug. It slows the growth of cancer cells. This medicine is used to treat testicular cancer and other cancers. This medicine may be used for other purposes; ask your health care provider or pharmacist if you have questions. COMMON BRAND NAME(S): Ifex, Ifex/Mesna What should I tell my health care provider before I take this medicine? They need to know if you have any of these conditions:  heart disease  history of irregular heartbeat  immune system problems  infection (especially a virus infection such as chickenpox, cold sores, or herpes)  infections in the bladder, kidneys, or urinary tract  kidney disease  low blood counts, like low white cell, platelet, or red cell counts  lung or breathing disease, like asthma  problems urinating  recent or ongoing radiation therapy  tingling of the fingers or toes, or other nerve disorder  an unusual or allergic reaction to ifosfamide, other medicines, foods, dyes, or preservatives  pregnant or trying to get pregnant  breast-feeding How should I use this medicine? This drug is given as an infusion into a vein. It is administered in a hospital or clinic by a specially trained health care  professional. Talk to your pediatrician regarding the use of this medicine in children. Special care may be needed. Overdosage: If you think you have taken too much of this medicine contact a poison control center or emergency room at once. NOTE: This medicine is only for you. Do not share this medicine with others. What if I miss a dose? It is important not to miss your dose. Call your doctor or health care professional if you are unable to keep an appointment. What may interact with this medicine? This medicine may interact with the following medications:  aprepitant, fosaprepitant  certain medicines for fungal infections like fluconazole, itraconazole, and ketoconazole  certain medicines for seizures like carbamazepine, phenobarbital, phenytoin  grapefruit, grapefruit juice  rifampin  [place] Wort This list may not describe all possible interactions. Give your health care provider a list of all the medicines, herbs, non-prescription drugs, or dietary supplements you use. Also tell them  if you smoke, drink alcohol, or use illegal drugs. Some items may interact with your medicine. What should I watch for while using this medicine? Visit your doctor for checks on your progress. This drug may make you feel generally unwell. This is not uncommon, as chemotherapy can affect healthy cells as well as cancer cells. Report any side effects. Continue your course of treatment even though you feel ill unless your doctor tells you to stop. Drink water or other fluids as directed. Urinate often, even at night. In some cases, you may be given additional medicines to help with side effects. Follow all directions for their use. Call your doctor or health care professional for advice if you get a fever, chills or sore throat, or other symptoms of a cold or flu. Do not treat yourself. This drug decreases your body's ability to fight infections. Try to avoid being around people who are sick. This medicine  may increase your risk to bruise or bleed. Call your doctor or health care professional if you notice any unusual bleeding. Be careful brushing and flossing your teeth or using a toothpick because you may get an infection or bleed more easily. If you have any dental work done, tell your dentist you are receiving this medicine. Avoid taking products that contain aspirin, acetaminophen, ibuprofen, naproxen, or ketoprofen unless instructed by your doctor. These medicines may hide a fever. Do not become pregnant while taking this medicine. Women should inform their doctor if they wish to become pregnant or think they might be pregnant. Men should not father a child while taking this medicine and for 6 months after stopping it. There is a potential for serious side effects to an unborn child. Talk to your health care professional or pharmacist for more information. Do not breast-feed an infant while taking this medicine. This medicine has caused ovarian failure in some women. This medicine may interfere with the ability to have a child. Talk with your doctor or health care professional if you are concerned about your fertility. This medicine has caused reduced sperm counts in some men. This may interfere with the ability to father a child. You should talk to your doctor or health care professional if you are concerned about your fertility. What side effects may I notice from receiving this medicine? Side effects that you should report to your doctor or health care professional as soon as possible:  allergic reactions like skin rash, itching or hives, swelling of the face, lips, or tongue  breathing problems  chest pain or palpitations  confusion  dizziness  hallucination, loss of contact with reality  loss of balance or coordination  nausea, vomiting  pain, tingling, numbness in the hands or feet  seizures  signs of decreased platelets or bleeding - bruising, pinpoint red spots on the skin,  black, tarry stools, blood in the urine  signs of decreased red blood cells - unusually weak or tired, feeling faint or lightheaded, falls  signs and symptoms of infection like fever or chills; cough; sore throat; pain or trouble passing urine  signs and symptoms of kidney injury like trouble passing urine or change in the amount of urine  signs and symptoms of liver injury like dark yellow or brown urine; general ill feeling or flu-like symptoms; light-colored stools; loss of appetite; nausea; right upper belly pain; unusually weak or tired; yellowing of the eyes or skin  swelling of the legs or ankles  tremors Side effects that usually do not require medical attention (  report to your doctor or health care professional if they continue or are bothersome):  changes in vision  constipation  decreased hearing  diarrhea  hair loss  headache  loss of appetite  loss of memory  mouth sores  rash This list may not describe all possible side effects. Call your doctor for medical advice about side effects. You may report side effects to FDA at 1-800-FDA-1088. Where should I keep my medicine? This drug is given in a hospital or clinic and will not be stored at home. NOTE: This sheet is a summary. It may not cover all possible information. If you have questions about this medicine, talk to your doctor, pharmacist, or health care provider.  2020 [employer]/Gold Standard (2016-11-22 09:57:40)

## 2019-06-01 DIAGNOSIS — M6281 Muscle weakness (generalized): Secondary | ICD-10-CM | POA: Diagnosis not present

## 2019-06-05 DIAGNOSIS — E7800 Pure hypercholesterolemia, unspecified: Secondary | ICD-10-CM | POA: Diagnosis not present

## 2019-06-05 DIAGNOSIS — I10 Essential (primary) hypertension: Secondary | ICD-10-CM | POA: Diagnosis not present

## 2019-06-05 DIAGNOSIS — Z8546 Personal history of malignant neoplasm of prostate: Secondary | ICD-10-CM | POA: Diagnosis not present

## 2019-06-05 DIAGNOSIS — I480 Paroxysmal atrial fibrillation: Secondary | ICD-10-CM | POA: Diagnosis not present

## 2019-06-06 DIAGNOSIS — R2689 Other abnormalities of gait and mobility: Secondary | ICD-10-CM | POA: Diagnosis not present

## 2019-06-06 DIAGNOSIS — R5381 Other malaise: Secondary | ICD-10-CM | POA: Diagnosis not present

## 2019-06-06 DIAGNOSIS — G629 Polyneuropathy, unspecified: Secondary | ICD-10-CM | POA: Diagnosis not present

## 2019-06-06 DIAGNOSIS — Z7409 Other reduced mobility: Secondary | ICD-10-CM | POA: Diagnosis not present

## 2019-06-06 DIAGNOSIS — R531 Weakness: Secondary | ICD-10-CM | POA: Diagnosis not present

## 2019-06-06 NOTE — Progress Notes (Signed)
Cardiology Office Note   Date:  06/11/2019   ID:  [name] Rose, DOB 1950/06/24, MRN [id number]  PCP:  [name] Frees, Rose  Cardiologist:  [name] Casino, Rose EP: None  Chief Complaint  Patient presents with  . Follow-up    s/p Cardioversion 05/18/19      History of Present Illness: [name] Rose is a 69 y.o. 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  He was last evaluated by cardiology at an outpatient visit with [name] Rose 04/10/2019. He was in persistent atrial fibrillation with RVR at that time. His diltiazem was increased to 327m daily for improved rate control. He was cleared to start eliquis 544mBID by his oncologist, so he was recommended to continue for a minimum of 3 weeks, then plan for DCCV. He had successful DCCV 05/18/2019. TEE 06/2018 to evaluate MSSA bacteremia showed EF 55-60%, moderate MR, no valvular or catheter vegetations seen.   He presents today for follow-up of his atrial fibrillation. He is back in atrial fibrillation today. He initially felt well following his DCCV. He was noted to be in Afib at his visit with oncology 05/28/19. He reports he has felt "off" since that time. A little jittery, occasional fluttering in chest. No complaints of chest pain, SOB, dizziness, lightheadedness, or syncope. He has continued to struggle with LE edema. We discussed the importance of maintaining a low sodium diet, which I suspect is still an issue. He is still struggling with neuropathy and unfortunately his nephrologist had to decreased his gabapentin due to his kidney function.    Past Medical History:  Diagnosis Date  . Arthritis   . 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 ([place]  . Bladder tumor   . Essential hypertension 06/23/2018  . Goals of care, counseling/discussion 09/30/2016  . History of prostate cancer  followed by pcp dr [name] Rose per pt last PSA undetectable   dx 2008-- (Stage T1c, Gleason 3+3,  PSA 4.58, vol 99cc)  s/p  radical prostatectomy (nerve sparing bilateral)   . Hyperglycemia 06/23/2018  . Hypertension   . Lower urinary tract symptoms (LUTS)   . Mild hyperlipidemia 06/23/2018  . Pre-diabetes   . Wears glasses     Past Surgical History:  Procedure Laterality Date  . CARDIOVERSION N/A 05/18/2019   Procedure: CARDIOVERSION;  Surgeon: [name] Rose;  [facility] Service: Cardiovascular;  Laterality: N/A;  . CATARACT EXTRACTION W/ INTRAOCULAR LENS  IMPLANT, BILATERAL Bilateral 2011  . [place] IR FLUORO GUIDE PORT INSERTION RIGHT  10/07/2016  . IR RADIOLOGIST EVAL & MGMT  01/05/2018  . IR [place] V[place] ACCESS RIGHT  10/07/2016  . KNEE ARTHROSCOPY Bilateral right 2006;  left 02-15-2007  . [place]  [place]  . ROBOT ASSISTED LAPAROSCOPIC RADICAL PROSTATECTOMY  06/20/2006   bilateral nerve sparing  . TEE WITHOUT CARDIOVERSION N/A 06/27/2018   Procedure: TRANSESOPHAGEAL ECHOCARDIOGRAM (TEE);  Surgeon: [name] Rose;  [facility];  Service: Cardiovascular;  Laterality: N/A;  . TOTAL HIP ARTHROPLASTY Left 03/03/2015   Procedure: LEFT TOTAL HIP ARTHROPLASTY ANTERIOR APPROACH;  Surgeon: JoDorna LeitzMD;  [place] [facility]: [place];  Laterality: Left;  . TOTAL KNEE ARTHROPLASTY Bilateral left 08-13-2009;  right 12-26-2009  . TRANSURETHRAL RESECTION OF BLADDER TUMOR N/A 09/20/2016   Procedure: TRANSURETHRAL RESECTION OF  BLADDER TUMOR (TURBT);  Surgeon: [name] Gallo, Rose;  Location: [facility];  Service: Urology;  Laterality: N/A;     Current Outpatient Medications  Medication Sig Dispense Refill  . apixaban (ELIQUIS) 5 MG TABS tablet Take 1 tablet (5 mg total) by mouth 2 (two) times daily. 60 tablet 6  . atorvastatin (LIPITOR) 40 MG tablet Take 40 mg by mouth every  evening.     . diltiazem (CARDIZEM CD) 360 MG 24 hr capsule Take 1 capsule (360 mg total) by mouth daily. (Patient taking differently: Take 360 mg by mouth daily with breakfast. ) 30 capsule 2  . gabapentin (NEURONTIN) 400 MG capsule TAKE 1 CAPSULE (400 MG TOTAL) BY MOUTH 4 (FOUR) TIMES DAILY. (Patient taking differently: Take 400-800 mg by mouth See admin instructions. Take 2 capsules (800 mg) by mouth with breakfast, take 1 capsule (400 mg) by mouth with lunch, take 1 capsule (400 mg) by mouth with supper & take 2 capsules (800 mg) by mouth at bedtime.) 360 capsule 2  . magnesium oxide (MAG-OX) 400 MG tablet Take 400 mg by mouth 2 (two) times daily.     . metoprolol succinate (TOPROL-XL) 25 MG 24 hr tablet Take 25 mg by mouth at bedtime.    . ondansetron (ZOFRAN) 8 MG tablet Take 1 tablet (8 mg total) by mouth 2 (two) times daily as needed for refractory nausea / vomiting. Start 2 days after chemotherapy. 30 tablet 1  . Potassium Chloride ER 20 MEQ TBCR Take 20 mEq by mouth daily with breakfast.     . pyridoxine (B-6) 100 MG tablet Take 200 mg by mouth daily with breakfast.     . Vitamin D, Ergocalciferol, (DRISDOL) 1.25 MG (50000 UT) CAPS capsule Take 50,000 Units by mouth every Saturday.     Marland Kitchen amoxicillin (AMOXIL) 500 MG capsule SMARTSIG:4 Capsule(s) By Mouth Once    . Blood Glucose Monitoring Suppl ([place]) w/Device KIT See admin instructions.    Marland Kitchen dexamethasone (DECADRON) 4 MG tablet Take 2 tablets (8 mg total) by mouth daily. Start the day after chemotherapy for 2 days. Take with food. (Patient not taking: Reported on 06/11/2019) 30 tablet 1  . fluconazole (DIFLUCAN) 100 MG tablet Take 100 mg by mouth at bedtime.    Marland Kitchen LORazepam (ATIVAN) 0.5 MG tablet Take 1 tablet (0.5 mg total) by mouth every 6 (six) hours as needed (Nausea or vomiting). (Patient not taking: Reported on 06/11/2019) 30 tablet 0  . OneTouch Delica Lancets 03T MISC 2 (two) times daily. as directed    . ONETOUCH  VERIO test strip 2 (two) times daily. for testing    . prochlorperazine (COMPAZINE) 10 MG tablet Take 1 tablet (10 mg total) by mouth every 6 (six) hours as needed (Nausea or vomiting). (Patient not taking: Reported on 06/11/2019) 30 tablet 1  . traMADol (ULTRAM) 50 MG tablet Take 50 mg by mouth daily as needed (pain.).      No current facility-administered medications for this visit.   Facility-Administered Medications Ordered in Other Visits  Medication Dose Route Frequency Provider Last Rate Last Admin  . sodium chloride flush (NS) 0.9 % injection 10 mL  10 mL Intravenous PRN [place], Holli Humbles, NP   10 mL at 02/21/17 1038  . sodium chloride flush (NS) 0.9 % injection 10 mL  10 mL Intravenous PRN Volanda Napoleon, Rose   10 mL at 07/10/18 0844    Allergies:   Patient has no known allergies.  Social History:  The patient  reports that he quit smoking about 34 years ago. His smoking use included cigarettes. He quit after 16.00 years of use. He has never used smokeless tobacco. He reports current alcohol use. He reports that he does not use drugs.   Family History:  The patient's family history includes Aneurysm in his mother; Emphysema in his father; Hypertension in his father and mother.    ROS:  Please see the history of present illness.   Otherwise, review of systems are positive for none.   All other systems are reviewed and negative.    PHYSICAL EXAM: VS:  BP 122/78   Pulse 84   Temp (!) 96.4 F (35.8 C)   Ht '6\' 2"'  (1.88 m)   Wt 278 lb 3.2 oz (126.2 kg)   SpO2 96%   BMI 35.72 kg/m  , BMI Body mass index is 35.72 kg/m. GEN: Well nourished, well developed, in no acute distress HEENT: sclera anicteric Neck: no JVD, carotid bruits, or masses Cardiac: IRIR; no murmurs, rubs, or gallops,no edema  Respiratory:  clear to auscultation bilaterally, normal work of breathing GI: soft, obese, nontender, nondistended, + BS MS: no deformity or atrophy Skin: warm and dry, no  rash Neuro:  Strength and sensation are intact Psych: euthymic mood, full affect   EKG:  EKG is ordered today. The ekg ordered today demonstrates atrial fibrillation with rate 83 bpm, PVCs, no STE/D, no TWI.     Recent Labs: 06/23/2018: TSH 0.455 12/29/2018: Magnesium 1.9 05/28/2019: ALT 16; BUN 33; Creatinine 1.94; Hemoglobin 10.3; Platelet Count 178; Potassium 3.9; Sodium 138    Lipid Panel    Component Value Date/Time   CHOL 159 10/05/2018 0411   CHOL 163 10/11/2016 0749   TRIG 131 10/05/2018 0411   TRIG 116 10/11/2016 0749   HDL 47 10/05/2018 0411   HDL 39 (L) 10/11/2016 0749   CHOLHDL 3.4 10/05/2018 0411   VLDL 26 10/05/2018 0411   LDLCALC 86 10/05/2018 0411   LDLCALC 101 (H) 10/11/2016 0749      Wt Readings from Last 3 Encounters:  06/11/19 278 lb 3.2 oz (126.2 kg)  05/28/19 273 lb (123.8 kg)  05/07/19 270 lb (122.5 kg)      Other studies Reviewed: Additional studies/ records that were reviewed today include:  TEE 06/27/2018: 1. The left ventricle has normal systolic function, with an ejection fraction of 55-60%. 2. Moderate mitral regurgitation. 3. No valvular vegetations seen. 4. 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     ASSESSMENT AND PLAN:  1. Paroxysmal atrial fibrillation: unfortunately he is back in atrial fibrillation at this visit, though rates are well controlled. Discussed antiarrhythmic options with [name] Rose. He has not had an ischemic evaluation. - Will plan for a NST to evaluate for ischemia - he has no anginal complaints.  - If NST is negative, will plan to start Flecainide 54m bid . Will need an EKG 1 week after starting flecainide. Than anticipate repeat DCCV 2 weeks after initiation of flecainide - Continue metoprolol and diltiazem for rate control - Continue eliquis for stroke ppx  2. HTN: BP 122/78 today - Continue metoprolol and diltiazem  3. HLD: LDL 86 09/2018 - Continue atorvastatin  4. Mitral  regurgitation: moderate on TEE 06/2018.  - Plan for surveillance echo in 2022 or sooner if symptoms change.  5. CKD stage 3b: Cr 1.94 05/28/2019 - Continue routine monitoring and follow-up with Nephrology   Current medicines are reviewed at  length with the patient today.  The patient does not have concerns regarding medicines.  The following changes have been made:  As above  Labs/ tests ordered today include:   Orders Placed This Encounter  Procedures  . MYOCARDIAL PERFUSION IMAGING  . EKG 12-Lead     Disposition:   FU with me pending NST evaluation.   Signed, Abigail Butts, PA-C  06/11/2019 5:12 PM

## 2019-06-07 DIAGNOSIS — Z7409 Other reduced mobility: Secondary | ICD-10-CM | POA: Diagnosis not present

## 2019-06-07 DIAGNOSIS — R531 Weakness: Secondary | ICD-10-CM | POA: Diagnosis not present

## 2019-06-07 DIAGNOSIS — R5381 Other malaise: Secondary | ICD-10-CM | POA: Diagnosis not present

## 2019-06-07 DIAGNOSIS — R2689 Other abnormalities of gait and mobility: Secondary | ICD-10-CM | POA: Diagnosis not present

## 2019-06-07 DIAGNOSIS — G629 Polyneuropathy, unspecified: Secondary | ICD-10-CM | POA: Diagnosis not present

## 2019-06-08 ENCOUNTER — Telehealth: Payer: Self-pay | Admitting: Internal Medicine

## 2019-06-08 NOTE — Telephone Encounter (Signed)
Patient aware wife can come to appointment. Added to notes.

## 2019-06-08 NOTE — Telephone Encounter (Signed)
  Patient's wife would like to come with him to his visit on 06/11/19 with Roby Lofts. She states that since he has been on chemo he has "chemo brain" and has some issues retaining information.

## 2019-06-11 ENCOUNTER — Ambulatory Visit: Payer: PPO | Admitting: Medical

## 2019-06-11 ENCOUNTER — Other Ambulatory Visit: Payer: Self-pay

## 2019-06-11 ENCOUNTER — Encounter: Payer: Self-pay | Admitting: *Deleted

## 2019-06-11 ENCOUNTER — Encounter: Payer: Self-pay | Admitting: Medical

## 2019-06-11 VITALS — BP 122/78 | HR 84 | Temp 96.4°F | Ht 74.0 in | Wt 278.2 lb

## 2019-06-11 DIAGNOSIS — E782 Mixed hyperlipidemia: Secondary | ICD-10-CM | POA: Diagnosis not present

## 2019-06-11 DIAGNOSIS — I340 Nonrheumatic mitral (valve) insufficiency: Secondary | ICD-10-CM

## 2019-06-11 DIAGNOSIS — I480 Paroxysmal atrial fibrillation: Secondary | ICD-10-CM | POA: Diagnosis not present

## 2019-06-11 DIAGNOSIS — I10 Essential (primary) hypertension: Secondary | ICD-10-CM | POA: Diagnosis not present

## 2019-06-11 DIAGNOSIS — N1832 Chronic kidney disease, stage 3b: Secondary | ICD-10-CM | POA: Diagnosis not present

## 2019-06-11 NOTE — Patient Instructions (Signed)
Medication Instructions:  Your physician recommends that you continue on your current medications as directed. Please refer to the Current Medication list given to you today.  *If you need a refill on your cardiac medications before your next appointment, please call your pharmacy*   Testing/Procedures: Your physician has requested that you have a lexiscan myoview. For further information please visit [email or web address]. Please follow instruction sheet, as given.   Follow-Up: At [facility], you and your health needs are our priority.  As part of our continuing mission to provide you with exceptional heart care, we have created designated Provider Care Teams.  These Care Teams include your primary Cardiologist (physician) and Advanced Practice Providers (APPs -  Physician Assistants and Nurse Practitioners) who all work together to provide you with the care you need, when you need it.  We recommend signing up for the patient portal called "MyChart".  Sign up information is provided on this After Visit Summary.  MyChart is used to connect with patients for Virtual Visits (Telemedicine).  Patients are able to view lab/test results, encounter notes, upcoming appointments, etc.  Non-urgent messages can be sent to your provider as well.   To learn more about what you can do with MyChart, go to [email or web address].    Your next appointment:   We will call you to let you know when to follow-up after your stress test results come in.

## 2019-06-12 ENCOUNTER — Encounter (HOSPITAL_COMMUNITY)
Admission: RE | Admit: 2019-06-12 | Discharge: 2019-06-12 | Disposition: A | Discharge status: Home / Self Care | Payer: PPO | Source: Ambulatory Visit | Attending: Hematology & Oncology | Admitting: Hematology & Oncology

## 2019-06-12 ENCOUNTER — Telehealth: Payer: Self-pay | Admitting: *Deleted

## 2019-06-12 DIAGNOSIS — C679 Malignant neoplasm of bladder, unspecified: Secondary | ICD-10-CM | POA: Diagnosis not present

## 2019-06-12 DIAGNOSIS — C772 Secondary and unspecified malignant neoplasm of intra-abdominal lymph nodes: Secondary | ICD-10-CM | POA: Insufficient documentation

## 2019-06-12 DIAGNOSIS — Z79899 Other long term (current) drug therapy: Secondary | ICD-10-CM | POA: Diagnosis not present

## 2019-06-12 LAB — GLUCOSE, CAPILLARY: Glucose-Capillary: 146 mg/dL — ABNORMAL HIGH (ref 70–99)

## 2019-06-12 MED ORDER — FLUDEOXYGLUCOSE F - 18 (FDG) INJECTION
13.9000 | Freq: Once | INTRAVENOUS | Status: AC | PRN
  Administered 2019-06-12: 13.9 via INTRAVENOUS

## 2019-06-12 NOTE — Anesthesia Postprocedure Evaluation (Signed)
Anesthesia Post Note  Patient: [name] Rose  Procedure(s) Performed: CARDIOVERSION (N/A )     Patient location during evaluation: Endoscopy Anesthesia Type: General Level of consciousness: sedated Pain management: pain level controlled Vital Signs Assessment: post-procedure vital signs reviewed and stable Cardiovascular status: stable Postop Assessment: no apparent nausea or vomiting Anesthetic complications: no    Last Vitals:  Vitals:   05/18/19 0905 05/18/19 0915  BP: 114/60 112/72  Pulse: 84 91  Resp: 15 13  Temp:    SpO2: 98% 99%    Last Pain:  Vitals:   05/21/19 0818  TempSrc:   PainSc: 0-No pain   Pain Goal:                   [name]

## 2019-06-12 NOTE — Telephone Encounter (Signed)
-----   Message from Volanda Napoleon, MD sent at 06/12/2019 11:38 AM EST ----- Call - the cancer is almost in remission again!!!  This is wonderful news!!  Engineer, manufacturing job!!  [name]

## 2019-06-12 NOTE — Telephone Encounter (Signed)
Pt notified per order of [name] that "the cancer is almost in remission again!!!  This is wonderful news!!  Great job!!"  Pt appreciative of call and has no questions or concerns at this time.

## 2019-06-18 ENCOUNTER — Inpatient Hospital Stay: Payer: PPO

## 2019-06-18 ENCOUNTER — Inpatient Hospital Stay (HOSPITAL_BASED_OUTPATIENT_CLINIC_OR_DEPARTMENT_OTHER): Payer: PPO | Admitting: Hematology & Oncology

## 2019-06-18 ENCOUNTER — Other Ambulatory Visit: Payer: Self-pay

## 2019-06-18 ENCOUNTER — Encounter: Payer: Self-pay | Admitting: Hematology & Oncology

## 2019-06-18 ENCOUNTER — Inpatient Hospital Stay: Payer: PPO | Attending: Hematology & Oncology

## 2019-06-18 VITALS — BP 126/76 | HR 83 | Temp 97.1°F | Resp 17 | Wt 278.0 lb

## 2019-06-18 DIAGNOSIS — Z5111 Encounter for antineoplastic chemotherapy: Secondary | ICD-10-CM | POA: Diagnosis not present

## 2019-06-18 DIAGNOSIS — C772 Secondary and unspecified malignant neoplasm of intra-abdominal lymph nodes: Secondary | ICD-10-CM

## 2019-06-18 DIAGNOSIS — C679 Malignant neoplasm of bladder, unspecified: Secondary | ICD-10-CM

## 2019-06-18 DIAGNOSIS — G620 Drug-induced polyneuropathy: Secondary | ICD-10-CM | POA: Diagnosis not present

## 2019-06-18 DIAGNOSIS — Z7901 Long term (current) use of anticoagulants: Secondary | ICD-10-CM | POA: Diagnosis not present

## 2019-06-18 DIAGNOSIS — T451X5A Adverse effect of antineoplastic and immunosuppressive drugs, initial encounter: Secondary | ICD-10-CM | POA: Diagnosis not present

## 2019-06-18 DIAGNOSIS — D649 Anemia, unspecified: Secondary | ICD-10-CM | POA: Diagnosis not present

## 2019-06-18 DIAGNOSIS — I4891 Unspecified atrial fibrillation: Secondary | ICD-10-CM | POA: Insufficient documentation

## 2019-06-18 DIAGNOSIS — Z5189 Encounter for other specified aftercare: Secondary | ICD-10-CM | POA: Diagnosis not present

## 2019-06-18 DIAGNOSIS — Z79899 Other long term (current) drug therapy: Secondary | ICD-10-CM | POA: Insufficient documentation

## 2019-06-18 LAB — CMP (CANCER CENTER ONLY)
ALT: 17 U/L (ref 0–44)
AST: 21 U/L (ref 15–41)
Albumin: 3.8 g/dL (ref 3.5–5.0)
Alkaline Phosphatase: 96 U/L (ref 38–126)
Anion gap: 9 (ref 5–15)
BUN: 26 mg/dL — ABNORMAL HIGH (ref 8–23)
CO2: 27 mmol/L (ref 22–32)
Calcium: 8.8 mg/dL — ABNORMAL LOW (ref 8.9–10.3)
Chloride: 103 mmol/L (ref 98–111)
Creatinine: 1.98 mg/dL — ABNORMAL HIGH (ref 0.61–1.24)
GFR, Est AFR Am: 39 mL/min — ABNORMAL LOW (ref >60)
GFR, Estimated: 34 mL/min — ABNORMAL LOW (ref >60)
Glucose, Bld: 226 mg/dL — ABNORMAL HIGH (ref 70–99)
Potassium: 3.9 mmol/L (ref 3.5–5.1)
Sodium: 139 mmol/L (ref 135–145)
Total Bilirubin: 0.5 mg/dL (ref 0.3–1.2)
Total Protein: 5.8 g/dL — ABNORMAL LOW (ref 6.5–8.1)

## 2019-06-18 LAB — CBC WITH DIFFERENTIAL (CANCER CENTER ONLY)
Abs Immature Granulocytes: 0.07 10*3/uL (ref 0.00–0.07)
Basophils Absolute: 0 10*3/uL (ref 0.0–0.1)
Basophils Relative: 1 %
Eosinophils Absolute: 0.1 10*3/uL (ref 0.0–0.5)
Eosinophils Relative: 1 %
HCT: 28.2 % — ABNORMAL LOW (ref 39.0–52.0)
Hemoglobin: 8.7 g/dL — ABNORMAL LOW (ref 13.0–17.0)
Immature Granulocytes: 1 %
Lymphocytes Relative: 15 %
Lymphs Abs: 1.3 10*3/uL (ref 0.7–4.0)
MCH: 31.3 pg (ref 26.0–34.0)
MCHC: 30.9 g/dL (ref 30.0–36.0)
MCV: 101.4 fL — ABNORMAL HIGH (ref 80.0–100.0)
Monocytes Absolute: 0.9 10*3/uL (ref 0.1–1.0)
Monocytes Relative: 10 %
Neutro Abs: 6.3 10*3/uL (ref 1.7–7.7)
Neutrophils Relative %: 72 %
Platelet Count: 121 10*3/uL — ABNORMAL LOW (ref 150–400)
RBC: 2.78 MIL/uL — ABNORMAL LOW (ref 4.22–5.81)
RDW: 18.8 % — ABNORMAL HIGH (ref 11.5–15.5)
WBC Count: 8.7 10*3/uL (ref 4.0–10.5)
nRBC: 0 % (ref 0.0–0.2)

## 2019-06-18 LAB — IRON AND TIBC
Iron: 54 ug/dL (ref 42–163)
Saturation Ratios: 23 % (ref 20–55)
TIBC: 239 ug/dL (ref 202–409)
UIBC: 185 ug/dL (ref 117–376)

## 2019-06-18 LAB — FERRITIN: Ferritin: 1294 ng/mL — ABNORMAL HIGH (ref 24–336)

## 2019-06-18 MED ORDER — HEPARIN SOD (PORK) LOCK FLUSH 100 UNIT/ML IV SOLN
500.0000 [IU] | Freq: Once | INTRAVENOUS | Status: AC | PRN
  Administered 2019-06-18: 500 [IU]
  Filled 2019-06-18: qty 5

## 2019-06-18 MED ORDER — SODIUM CHLORIDE 0.9 % IV SOLN
INTRAVENOUS | Status: DC
  Filled 2019-06-18 (×2): qty 250

## 2019-06-18 MED ORDER — DEXAMETHASONE SODIUM PHOSPHATE 10 MG/ML IJ SOLN
INTRAMUSCULAR | Status: AC
  Filled 2019-06-18: qty 1

## 2019-06-18 MED ORDER — PALONOSETRON HCL INJECTION 0.25 MG/5ML
INTRAVENOUS | Status: AC
  Filled 2019-06-18: qty 5

## 2019-06-18 MED ORDER — SODIUM CHLORIDE 0.9% FLUSH
10.0000 mL | INTRAVENOUS | Status: DC | PRN
  Administered 2019-06-18: 10 mL
  Filled 2019-06-18: qty 10

## 2019-06-18 MED ORDER — SODIUM CHLORIDE 0.9 % IV SOLN
500.0000 mg/m2 | Freq: Once | INTRAVENOUS | Status: AC
  Administered 2019-06-18: 1254 mg via INTRAVENOUS
  Filled 2019-06-18: qty 32.98

## 2019-06-18 MED ORDER — SODIUM CHLORIDE 0.9 % IV SOLN
Freq: Once | INTRAVENOUS | Status: AC
  Filled 2019-06-18: qty 60

## 2019-06-18 MED ORDER — PALONOSETRON HCL INJECTION 0.25 MG/5ML
0.2500 mg | Freq: Once | INTRAVENOUS | Status: AC
  Administered 2019-06-18: 0.25 mg via INTRAVENOUS

## 2019-06-18 MED ORDER — SODIUM CHLORIDE 0.9 % IV SOLN
Freq: Once | INTRAVENOUS | Status: AC
  Filled 2019-06-18: qty 250

## 2019-06-18 MED ORDER — SODIUM CHLORIDE 0.9 % IV SOLN
400.0000 mg/m2 | Freq: Once | INTRAVENOUS | Status: AC
  Administered 2019-06-18: 1000 mg via INTRAVENOUS
  Filled 2019-06-18: qty 10

## 2019-06-18 MED ORDER — DEXAMETHASONE SODIUM PHOSPHATE 10 MG/ML IJ SOLN
10.0000 mg | Freq: Once | INTRAMUSCULAR | Status: AC
  Administered 2019-06-18: 10 mg via INTRAVENOUS

## 2019-06-18 NOTE — Patient Instructions (Signed)

## 2019-06-18 NOTE — Patient Instructions (Signed)
[place] Discharge Instructions for Patients Receiving Chemotherapy  Today you received the following chemotherapy agents Ifosphamide, Gemzar  To help prevent nausea and vomiting after your treatment, we encourage you to take your nausea medication    If you develop nausea and vomiting that is not controlled by your nausea medication, call the clinic.   BELOW ARE SYMPTOMS THAT SHOULD BE REPORTED IMMEDIATELY:  *FEVER GREATER THAN 100.5 F  *CHILLS WITH OR WITHOUT FEVER  NAUSEA AND VOMITING THAT IS NOT CONTROLLED WITH YOUR NAUSEA MEDICATION  *UNUSUAL SHORTNESS OF BREATH  *UNUSUAL BRUISING OR BLEEDING  TENDERNESS IN MOUTH AND THROAT WITH OR WITHOUT PRESENCE OF ULCERS  *URINARY PROBLEMS  *BOWEL PROBLEMS  UNUSUAL RASH Items with * indicate a potential emergency and should be followed up as soon as possible.  Feel free to call the clinic should you have any questions or concerns. The clinic phone number is [phone].  Please show the [place] at check-in to the Emergency Department and triage nurse.

## 2019-06-18 NOTE — Progress Notes (Signed)
Hematology and Oncology Follow Up Visit  VADEN BECHERER [id number] 1950-12-19 69 y.o. 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 Taxotere 870mm2 IV q 3 wks - s/p cycle #3 - d/c due to progression  Current Therapy:   M-VAC - s/p cycle #8  -- d/c on 07/10/2018 due to blood counts Padcev -- s/p cycle #1 on 07/17/2018 Ifosfamide/Gemzar -- s/p cycle # 3 - started 04/16/2019   Interim History: Mr. NoMisners here today for follow-up.  I am happy to say that he has responded incredibly well to the chemotherapy protocol.  His PET scan was done on March 2.  PET scan showed marked improvement in his metastatic disease.  He had resolution of several areas of lymphadenopathy.  He feels okay.  As the neuropathy that is still the problem for him.  Neurontin does seem to help.  His blood sugars have been a little on the high side but he is trying to watch what he eats.  He has had no problems with nausea or vomiting.  The atrial fibrillation problems got had to be converted again.  I will know if this is going to be through cardioversion or through medication.  He is on Eliquis.  He has had no bleeding.  Overall, I would say his performance status is ECOG 1.    Medications:  Allergies as of 06/18/2019   No Known Allergies     Medication List       Accurate as of June 18, 2019  9:03 AM. If you have any questions, ask your nurse or doctor.        amoxicillin 500 MG capsule Commonly known as: AMOXIL SMARTSIG:4 Capsule(s) By Mouth Once   apixaban 5 MG Tabs tablet Commonly known as: Eliquis Take 1 tablet (5 mg total) by mouth 2 (two) times daily.   atorvastatin 40 MG tablet Commonly known as: LIPITOR Take 40 mg by mouth every evening.   dexamethasone 4 MG tablet Commonly known as: DECADRON Take 2 tablets (8 mg total) by  mouth daily. Start the day after chemotherapy for 2 days. Take with food.   diltiazem 360 MG 24 hr capsule Commonly known as: Cardizem CD Take 1 capsule (360 mg total) by mouth daily. What changed: when to take this   fluconazole 100 MG tablet Commonly known as: DIFLUCAN Take 100 mg by mouth at bedtime.   gabapentin 400 MG capsule Commonly known as: NEURONTIN TAKE 1 CAPSULE (400 MG TOTAL) BY MOUTH 4 (FOUR) TIMES DAILY. What changed:   how much to take  when to take this  additional instructions   LORazepam 0.5 MG tablet Commonly known as: Ativan Take 1 tablet (0.5 mg total) by mouth every 6 (six) hours as needed (Nausea or vomiting).   magnesium oxide 400 MG tablet Commonly known as: MAG-OX Take 400 mg by mouth 2 (two) times daily.   metoprolol succinate 25 MG 24 hr tablet Commonly known as: TOPROL-XL Take 25 mg by mouth at bedtime.   ondansetron 8 MG tablet Commonly known as: Zofran Take 1 tablet (8 mg total) by mouth 2 (two) times daily as needed for refractory nausea / vomiting. Start 2 days after chemotherapy.   OneTouch Delica Lancets 3348Oisc 2 (two) times daily. as directed   OnWellman/Device Kit See admin instructions.   OneTouch  Verio test strip Generic drug: glucose blood 2 (two) times daily. for testing   Potassium Chloride ER 20 MEQ Tbcr Take 20 mEq by mouth daily with breakfast.   prochlorperazine 10 MG tablet Commonly known as: COMPAZINE Take 1 tablet (10 mg total) by mouth every 6 (six) hours as needed (Nausea or vomiting).   pyridoxine 100 MG tablet Commonly known as: B-6 Take 200 mg by mouth daily with breakfast.   traMADol 50 MG tablet Commonly known as: ULTRAM Take 50 mg by mouth daily as needed (pain.).   Vitamin D (Ergocalciferol) 1.25 MG (50000 UNIT) Caps capsule Commonly known as: DRISDOL Take 50,000 Units by mouth every Saturday.       Allergies: No Known Allergies  Past Medical History, Surgical  history, Social history, and Family History were reviewed and updated.  Review of Systems: Review of Systems  Constitutional: Negative.   HENT: Negative.   Eyes: Negative.   Respiratory: Negative.   Cardiovascular: Negative.   Gastrointestinal: Negative.   Genitourinary: Negative.   Musculoskeletal: Negative.   Skin: Negative.   Neurological: Positive for tingling.  Endo/Heme/Allergies: Negative.   Psychiatric/Behavioral: Negative.      Physical Exam:  weight is 278 lb (126.1 kg). His temporal temperature is 97.1 F (36.2 C) (abnormal). His blood pressure is 126/76 and his pulse is 83. His respiration is 17 and oxygen saturation is 97%.   Wt Readings from Last 3 Encounters:  06/18/19 278 lb (126.1 kg)  06/11/19 278 lb 3.2 oz (126.2 kg)  05/28/19 273 lb (123.8 kg)    Physical Exam Vitals reviewed.  HENT:     Head: Normocephalic and atraumatic.  Eyes:     Pupils: Pupils are equal, round, and reactive to light.  Cardiovascular:     Comments: Cardiac exam shows an irregular rate and irregular rhythm consistent with atrial fibrillation.  The rate is fairly well controlled.  I do not detect any murmurs, rubs or bruits. Pulmonary:     Effort: Pulmonary effort is normal.     Breath sounds: Normal breath sounds.  Abdominal:     General: Bowel sounds are normal.     Palpations: Abdomen is soft.  Musculoskeletal:        General: No tenderness or deformity. Normal range of motion.     Cervical back: Normal range of motion.  Lymphadenopathy:     Cervical: No cervical adenopathy.  Skin:    General: Skin is warm and dry.     Findings: No erythema or rash.  Neurological:     Mental Status: He is alert and oriented to person, place, and time.  Psychiatric:        Behavior: Behavior normal.        Thought Content: Thought content normal.        Judgment: Judgment normal.      Lab Results  Component Value Date   WBC 8.7 06/18/2019   HGB 8.7 (L) 06/18/2019   HCT 28.2 (L)  06/18/2019   MCV 101.4 (H) 06/18/2019   PLT 121 (L) 06/18/2019   Lab Results  Component Value Date   FERRITIN 1,129 (H) 05/28/2019   IRON 68 05/28/2019   TIBC 246 05/28/2019   UIBC 178 05/28/2019   IRONPCTSAT 28 05/28/2019   Lab Results  Component Value Date   RETICCTPCT 3.7 (H) 09/18/2018   RBC 2.78 (L) 06/18/2019   No results found for: KPAFRELGTCHN, LAMBDASER, KAPLAMBRATIO No results found for: IGGSERUM, IGA, IGMSERUM No results found for: TOTALPROTELP, ALBUMINELP,  Nils Flack, SPEI   Chemistry      Component Value Date/Time   NA 139 06/18/2019 0815   NA 140 05/15/2019 1109   NA 141 02/21/2017 0902   K 3.9 06/18/2019 0815   K 4.1 02/21/2017 0902   CL 103 06/18/2019 0815   CL 98 02/21/2017 0902   CO2 27 06/18/2019 0815   CO2 31 02/21/2017 0902   BUN 26 (H) 06/18/2019 0815   BUN 36 (H) 05/15/2019 1109   BUN 17 02/21/2017 0902   CREATININE 1.98 (H) 06/18/2019 0815   CREATININE 1.2 02/21/2017 0902      Component Value Date/Time   CALCIUM 8.8 (L) 06/18/2019 0815   CALCIUM 9.2 02/21/2017 0902   ALKPHOS 96 06/18/2019 0815   ALKPHOS 65 02/21/2017 0902   AST 21 06/18/2019 0815   ALT 17 06/18/2019 0815   ALT 18 02/21/2017 0902   BILITOT 0.5 06/18/2019 0815       Impression and Plan: Mr. Lyles is a very pleasant 69 year old Caucasian gentleman with metastatic high grade bladder cancer.   His labs look pretty good.  His blood sugar is up a little bit.  His renal function is down a tad.  His renal function is a little bit down but clearly not any worse.  His liver function studies are back to normal which is good to see.  We did be careful with his anemia.  I suspect this is part from the chemotherapy itself.  His erythropoietin level is 134.  As such, he might be able to respond to Aranesp.  For right now we will just follow his blood count.  We will go ahead with his fourth cycle of treatment.  We will have him come back in 3  weeks for his fifth cycle of treatment.  We will have to watch his blood count closely.   Volanda Napoleon, MD 3/8/20219:03 AM

## 2019-06-19 ENCOUNTER — Inpatient Hospital Stay: Payer: PPO

## 2019-06-19 VITALS — BP 134/85 | HR 75 | Temp 97.6°F | Resp 19

## 2019-06-19 DIAGNOSIS — C679 Malignant neoplasm of bladder, unspecified: Secondary | ICD-10-CM

## 2019-06-19 DIAGNOSIS — Z5111 Encounter for antineoplastic chemotherapy: Secondary | ICD-10-CM | POA: Diagnosis not present

## 2019-06-19 DIAGNOSIS — C772 Secondary and unspecified malignant neoplasm of intra-abdominal lymph nodes: Secondary | ICD-10-CM

## 2019-06-19 MED ORDER — DEXAMETHASONE SODIUM PHOSPHATE 10 MG/ML IJ SOLN
INTRAMUSCULAR | Status: AC
  Filled 2019-06-19: qty 1

## 2019-06-19 MED ORDER — SODIUM CHLORIDE 0.9% FLUSH
10.0000 mL | INTRAVENOUS | Status: DC | PRN
  Administered 2019-06-19: 10 mL
  Filled 2019-06-19: qty 10

## 2019-06-19 MED ORDER — HEPARIN SOD (PORK) LOCK FLUSH 100 UNIT/ML IV SOLN
500.0000 [IU] | Freq: Once | INTRAVENOUS | Status: AC | PRN
  Administered 2019-06-19: 500 [IU]
  Filled 2019-06-19: qty 5

## 2019-06-19 MED ORDER — SODIUM CHLORIDE 0.9 % IV SOLN
INTRAVENOUS | Status: DC
  Filled 2019-06-19 (×2): qty 250

## 2019-06-19 MED ORDER — SODIUM CHLORIDE 0.9 % IV SOLN
400.0000 mg/m2 | Freq: Once | INTRAVENOUS | Status: AC
  Administered 2019-06-19: 1000 mg via INTRAVENOUS
  Filled 2019-06-19: qty 10

## 2019-06-19 MED ORDER — DEXAMETHASONE SODIUM PHOSPHATE 10 MG/ML IJ SOLN
10.0000 mg | Freq: Once | INTRAMUSCULAR | Status: AC
  Administered 2019-06-19: 10 mg via INTRAVENOUS

## 2019-06-19 MED ORDER — SODIUM CHLORIDE 0.9 % IV SOLN
Freq: Once | INTRAVENOUS | Status: AC
  Filled 2019-06-19: qty 250

## 2019-06-19 MED ORDER — SODIUM CHLORIDE 0.9 % IV SOLN
Freq: Once | INTRAVENOUS | Status: AC
  Filled 2019-06-19: qty 60

## 2019-06-19 MED ORDER — SODIUM CHLORIDE 0.9 % IV SOLN
150.0000 mg | Freq: Once | INTRAVENOUS | Status: AC
  Administered 2019-06-19: 150 mg via INTRAVENOUS
  Filled 2019-06-19: qty 150

## 2019-06-19 NOTE — Patient Instructions (Signed)
Ifosfamide injection What is this medicine? IFOSFAMIDE (eye FOS fa mide) is a chemotherapy drug. It slows the growth of cancer cells. This medicine is used to treat testicular cancer and other cancers. This medicine may be used for other purposes; ask your health care provider or pharmacist if you have questions. COMMON BRAND NAME(S): Ifex, Ifex/Mesna What should I tell my health care provider before I take this medicine? They need to know if you have any of these conditions:  heart disease  history of irregular heartbeat  immune system problems  infection (especially a virus infection such as chickenpox, cold sores, or herpes)  infections in the bladder, kidneys, or urinary tract  kidney disease  low blood counts, like low white cell, platelet, or red cell counts  lung or breathing disease, like asthma  problems urinating  recent or ongoing radiation therapy  tingling of the fingers or toes, or other nerve disorder  an unusual or allergic reaction to ifosfamide, other medicines, foods, dyes, or preservatives  pregnant or trying to get pregnant  breast-feeding How should I use this medicine? This drug is given as an infusion into a vein. It is administered in a hospital or clinic by a specially trained health care professional. Talk to your pediatrician regarding the use of this medicine in children. Special care may be needed. Overdosage: If you think you have taken too much of this medicine contact a poison control center or emergency room at once. NOTE: This medicine is only for you. Do not share this medicine with others. What if I miss a dose? It is important not to miss your dose. Call your doctor or health care professional if you are unable to keep an appointment. What may interact with this medicine? This medicine may interact with the following medications:  aprepitant, fosaprepitant  certain medicines for fungal infections like fluconazole, itraconazole, and  ketoconazole  certain medicines for seizures like carbamazepine, phenobarbital, phenytoin  grapefruit, grapefruit juice  rifampin  [place] Wort This list may not describe all possible interactions. Give your health care provider a list of all the medicines, herbs, non-prescription drugs, or dietary supplements you use. Also tell them if you smoke, drink alcohol, or use illegal drugs. Some items may interact with your medicine. What should I watch for while using this medicine? Visit your doctor for checks on your progress. This drug may make you feel generally unwell. This is not uncommon, as chemotherapy can affect healthy cells as well as cancer cells. Report any side effects. Continue your course of treatment even though you feel ill unless your doctor tells you to stop. Drink water or other fluids as directed. Urinate often, even at night. In some cases, you may be given additional medicines to help with side effects. Follow all directions for their use. Call your doctor or health care professional for advice if you get a fever, chills or sore throat, or other symptoms of a cold or flu. Do not treat yourself. This drug decreases your body's ability to fight infections. Try to avoid being around people who are sick. This medicine may increase your risk to bruise or bleed. Call your doctor or health care professional if you notice any unusual bleeding. Be careful brushing and flossing your teeth or using a toothpick because you may get an infection or bleed more easily. If you have any dental work done, tell your dentist you are receiving this medicine. Avoid taking products that contain aspirin, acetaminophen, ibuprofen, naproxen, or ketoprofen unless  instructed by your doctor. These medicines may hide a fever. Do not become pregnant while taking this medicine. Women should inform their doctor if they wish to become pregnant or think they might be pregnant. Men should not father a child while  taking this medicine and for 6 months after stopping it. There is a potential for serious side effects to an unborn child. Talk to your health care professional or pharmacist for more information. Do not breast-feed an infant while taking this medicine. This medicine has caused ovarian failure in some women. This medicine may interfere with the ability to have a child. Talk with your doctor or health care professional if you are concerned about your fertility. This medicine has caused reduced sperm counts in some men. This may interfere with the ability to father a child. You should talk to your doctor or health care professional if you are concerned about your fertility. What side effects may I notice from receiving this medicine? Side effects that you should report to your doctor or health care professional as soon as possible:  allergic reactions like skin rash, itching or hives, swelling of the face, lips, or tongue  breathing problems  chest pain or palpitations  confusion  dizziness  hallucination, loss of contact with reality  loss of balance or coordination  nausea, vomiting  pain, tingling, numbness in the hands or feet  seizures  signs of decreased platelets or bleeding - bruising, pinpoint red spots on the skin, black, tarry stools, blood in the urine  signs of decreased red blood cells - unusually weak or tired, feeling faint or lightheaded, falls  signs and symptoms of infection like fever or chills; cough; sore throat; pain or trouble passing urine  signs and symptoms of kidney injury like trouble passing urine or change in the amount of urine  signs and symptoms of liver injury like dark yellow or brown urine; general ill feeling or flu-like symptoms; light-colored stools; loss of appetite; nausea; right upper belly pain; unusually weak or tired; yellowing of the eyes or skin  swelling of the legs or ankles  tremors Side effects that usually do not require  medical attention (report to your doctor or health care professional if they continue or are bothersome):  changes in vision  constipation  decreased hearing  diarrhea  hair loss  headache  loss of appetite  loss of memory  mouth sores  rash This list may not describe all possible side effects. Call your doctor for medical advice about side effects. You may report side effects to FDA at 1-800-FDA-1088. Where should I keep my medicine? This drug is given in a hospital or clinic and will not be stored at home. NOTE: This sheet is a summary. It may not cover all possible information. If you have questions about this medicine, talk to your doctor, pharmacist, or health care provider.  2020 Elsevier/Gold Standard (2016-11-22 09:57:40) Mesna injection What is this medicine? MESNA (MES na) is used to prevent bleeding from the bladder during treatment with ifosfamide. This medicine does not reduce the chance of getting other side effects of cancer chemotherapy. This medicine may be used for other purposes; ask your health care provider or pharmacist if you have questions. COMMON BRAND NAME(S): Mesnex What should I tell my health care provider before I take this medicine? They need to know if you have any of these conditions:  autoimmune disease like lupus, nephritis, or rheumatoid arthritis  an unusual or allergic reaction to mesna, benzyl  alcohol, sulfur medicines, other medicines, foods, dyes, or preservatives  pregnant or trying to get pregnant  breast-feeding How should I use this medicine? This medicine is for infusion into a vein. It is given by a health care professional in a hospital or clinic setting. Talk to your pediatrician regarding the use of this medicine in children. Special care may be needed. Overdosage: If you think you have taken too much of this medicine contact a poison control center or emergency room at once. NOTE: This medicine is only for you. Do not  share this medicine with others. What if I miss a dose? This does not apply. What may interact with this medicine? Interactions are not expected. This list may not describe all possible interactions. Give your health care provider a list of all the medicines, herbs, non-prescription drugs, or dietary supplements you use. Also tell them if you smoke, drink alcohol, or use illegal drugs. Some items may interact with your medicine. What should I watch for while using this medicine? Your doctor will follow your condition closely while you are taking this medicine. Tell your doctor right away if you see that your urine has turned a pink or red color. It is important to drink at least a quart (4 cups) of fluids each day that you take this medicine. This medicine may cause serious skin reactions. They can happen weeks to months after starting the medicine. Contact your health care provider right away if you notice fevers or flu-like symptoms with a rash. The rash may be red or purple and then turn into blisters or peeling of the skin. Or, you might notice a red rash with swelling of the face, lips or lymph nodes in your neck or under your arms. What side effects may I notice from receiving this medicine? Side effects that you should report to your doctor or health care professional as soon as possible:  allergic reactions like skin rash, itching or hives, swelling of the face, lips, or tongue  breathing problems  blood in your urine or pink to red colored urine  fever, chills, or sore throat  flushing or redness to skin  mouth sores  pain or redness at site where injected  redness, blistering, peeling or loosening of the skin, including inside the mouth  swelling of ankles or feet  vomiting Side effects that usually do not require medical attention (report to your doctor or health care professional if they continue or are bothersome):  aches and pains  bad taste in  mouth  diarrhea  dizziness  hair loss  headache  nausea This list may not describe all possible side effects. Call your doctor for medical advice about side effects. You may report side effects to FDA at 1-800-FDA-1088. Where should I keep my medicine? This drug is given in a hospital or clinic and will not be stored at home. NOTE: This sheet is a summary. It may not cover all possible information. If you have questions about this medicine, talk to your doctor, pharmacist, or health care provider.  2020 Elsevier/Gold Standard (2018-06-30 15:28:46)

## 2019-06-20 ENCOUNTER — Inpatient Hospital Stay: Payer: PPO

## 2019-06-20 ENCOUNTER — Other Ambulatory Visit: Payer: Self-pay

## 2019-06-20 VITALS — BP 145/86 | HR 92 | Temp 97.6°F | Resp 19

## 2019-06-20 DIAGNOSIS — C772 Secondary and unspecified malignant neoplasm of intra-abdominal lymph nodes: Secondary | ICD-10-CM

## 2019-06-20 DIAGNOSIS — C679 Malignant neoplasm of bladder, unspecified: Secondary | ICD-10-CM

## 2019-06-20 DIAGNOSIS — Z5111 Encounter for antineoplastic chemotherapy: Secondary | ICD-10-CM | POA: Diagnosis not present

## 2019-06-20 MED ORDER — PEGFILGRASTIM 6 MG/0.6ML ~~LOC~~ PSKT
6.0000 mg | PREFILLED_SYRINGE | Freq: Once | SUBCUTANEOUS | Status: AC
  Administered 2019-06-20: 6 mg via SUBCUTANEOUS

## 2019-06-20 MED ORDER — PALONOSETRON HCL INJECTION 0.25 MG/5ML
INTRAVENOUS | Status: AC
  Filled 2019-06-20: qty 5

## 2019-06-20 MED ORDER — PALONOSETRON HCL INJECTION 0.25 MG/5ML
0.2500 mg | Freq: Once | INTRAVENOUS | Status: AC
  Administered 2019-06-20: 0.25 mg via INTRAVENOUS

## 2019-06-20 MED ORDER — HEPARIN SOD (PORK) LOCK FLUSH 100 UNIT/ML IV SOLN
500.0000 [IU] | Freq: Once | INTRAVENOUS | Status: AC | PRN
  Administered 2019-06-20: 500 [IU]
  Filled 2019-06-20: qty 5

## 2019-06-20 MED ORDER — SODIUM CHLORIDE 0.9 % IV SOLN
Freq: Once | INTRAVENOUS | Status: AC
  Filled 2019-06-20: qty 250

## 2019-06-20 MED ORDER — DEXAMETHASONE SODIUM PHOSPHATE 10 MG/ML IJ SOLN
10.0000 mg | Freq: Once | INTRAMUSCULAR | Status: AC
  Administered 2019-06-20: 10 mg via INTRAVENOUS

## 2019-06-20 MED ORDER — DEXAMETHASONE SODIUM PHOSPHATE 10 MG/ML IJ SOLN
INTRAMUSCULAR | Status: AC
  Filled 2019-06-20: qty 1

## 2019-06-20 MED ORDER — HOT PACK MISC ONCOLOGY
1.0000 | Freq: Once | Status: DC | PRN
  Filled 2019-06-20: qty 1

## 2019-06-20 MED ORDER — PEGFILGRASTIM 6 MG/0.6ML ~~LOC~~ PSKT
PREFILLED_SYRINGE | SUBCUTANEOUS | Status: AC
  Filled 2019-06-20: qty 0.6

## 2019-06-20 MED ORDER — SODIUM CHLORIDE 0.9 % IV SOLN
400.0000 mg/m2 | Freq: Once | INTRAVENOUS | Status: AC
  Administered 2019-06-20: 1000 mg via INTRAVENOUS
  Filled 2019-06-20: qty 10

## 2019-06-20 MED ORDER — SODIUM CHLORIDE 0.9% FLUSH
10.0000 mL | INTRAVENOUS | Status: DC | PRN
  Administered 2019-06-20: 10 mL
  Filled 2019-06-20: qty 10

## 2019-06-20 MED ORDER — SODIUM CHLORIDE 0.9 % IV SOLN
Freq: Once | INTRAVENOUS | Status: AC
  Filled 2019-06-20: qty 60

## 2019-06-20 NOTE — Patient Instructions (Signed)
[place] Discharge Instructions for Patients Receiving Chemotherapy  Today you received the following chemotherapy agents Ifosphamide, Gemzar  To help prevent nausea and vomiting after your treatment, we encourage you to take your nausea medication    If you develop nausea and vomiting that is not controlled by your nausea medication, call the clinic.   BELOW ARE SYMPTOMS THAT SHOULD BE REPORTED IMMEDIATELY:  *FEVER GREATER THAN 100.5 F  *CHILLS WITH OR WITHOUT FEVER  NAUSEA AND VOMITING THAT IS NOT CONTROLLED WITH YOUR NAUSEA MEDICATION  *UNUSUAL SHORTNESS OF BREATH  *UNUSUAL BRUISING OR BLEEDING  TENDERNESS IN MOUTH AND THROAT WITH OR WITHOUT PRESENCE OF ULCERS  *URINARY PROBLEMS  *BOWEL PROBLEMS  UNUSUAL RASH Items with * indicate a potential emergency and should be followed up as soon as possible.  Feel free to call the clinic should you have any questions or concerns. The clinic phone number is [phone].  Please show the [place] at check-in to the Emergency Department and triage nurse.

## 2019-06-22 ENCOUNTER — Telehealth (HOSPITAL_COMMUNITY): Payer: Self-pay

## 2019-06-22 DIAGNOSIS — S0501XA Injury of conjunctiva and corneal abrasion without foreign body, right eye, initial encounter: Secondary | ICD-10-CM | POA: Diagnosis not present

## 2019-06-22 NOTE — Telephone Encounter (Signed)
Encounter complete. 

## 2019-06-26 ENCOUNTER — Other Ambulatory Visit: Payer: Self-pay | Admitting: Internal Medicine

## 2019-06-27 ENCOUNTER — Other Ambulatory Visit: Payer: Self-pay

## 2019-06-27 ENCOUNTER — Ambulatory Visit (HOSPITAL_COMMUNITY)
Admission: RE | Admit: 2019-06-27 | Discharge: 2019-06-27 | Disposition: A | Discharge status: Home / Self Care | Payer: PPO | Source: Ambulatory Visit | Attending: Cardiovascular Disease | Admitting: Cardiovascular Disease

## 2019-06-27 DIAGNOSIS — I480 Paroxysmal atrial fibrillation: Secondary | ICD-10-CM | POA: Insufficient documentation

## 2019-06-27 MED ORDER — REGADENOSON 0.4 MG/5ML IV SOLN
0.4000 mg | Freq: Once | INTRAVENOUS | Status: AC
  Administered 2019-06-27: 0.4 mg via INTRAVENOUS

## 2019-06-27 MED ORDER — TECHNETIUM TC 99M TETROFOSMIN IV KIT
30.5000 | PACK | Freq: Once | INTRAVENOUS | Status: AC | PRN
  Administered 2019-06-27: 30.5 via INTRAVENOUS
  Filled 2019-06-27: qty 31

## 2019-06-28 ENCOUNTER — Ambulatory Visit (HOSPITAL_COMMUNITY)
Admission: RE | Admit: 2019-06-28 | Discharge: 2019-06-28 | Disposition: A | Discharge status: Home / Self Care | Payer: PPO | Source: Ambulatory Visit | Attending: Internal Medicine | Admitting: Internal Medicine

## 2019-06-28 DIAGNOSIS — R531 Weakness: Secondary | ICD-10-CM | POA: Diagnosis not present

## 2019-06-28 DIAGNOSIS — R2689 Other abnormalities of gait and mobility: Secondary | ICD-10-CM | POA: Diagnosis not present

## 2019-06-28 DIAGNOSIS — Z7409 Other reduced mobility: Secondary | ICD-10-CM | POA: Diagnosis not present

## 2019-06-28 DIAGNOSIS — G629 Polyneuropathy, unspecified: Secondary | ICD-10-CM | POA: Diagnosis not present

## 2019-06-28 DIAGNOSIS — R5381 Other malaise: Secondary | ICD-10-CM | POA: Diagnosis not present

## 2019-06-28 LAB — MYOCARDIAL PERFUSION IMAGING
Peak HR: 116 {beats}/min
Rest HR: 87 {beats}/min
SDS: 2
SRS: 2
SSS: 3
TID: 0.91

## 2019-06-28 MED ORDER — TECHNETIUM TC 99M TETROFOSMIN IV KIT
29.1000 | PACK | Freq: Once | INTRAVENOUS | Status: AC | PRN
  Administered 2019-06-28: 29.1 via INTRAVENOUS

## 2019-06-29 DIAGNOSIS — Z8546 Personal history of malignant neoplasm of prostate: Secondary | ICD-10-CM | POA: Diagnosis not present

## 2019-06-29 DIAGNOSIS — I10 Essential (primary) hypertension: Secondary | ICD-10-CM | POA: Diagnosis not present

## 2019-06-29 DIAGNOSIS — N183 Chronic kidney disease, stage 3 unspecified: Secondary | ICD-10-CM | POA: Diagnosis not present

## 2019-06-29 DIAGNOSIS — E7800 Pure hypercholesterolemia, unspecified: Secondary | ICD-10-CM | POA: Diagnosis not present

## 2019-06-29 DIAGNOSIS — M6281 Muscle weakness (generalized): Secondary | ICD-10-CM | POA: Diagnosis not present

## 2019-06-29 DIAGNOSIS — I480 Paroxysmal atrial fibrillation: Secondary | ICD-10-CM | POA: Diagnosis not present

## 2019-07-02 DIAGNOSIS — R5381 Other malaise: Secondary | ICD-10-CM | POA: Diagnosis not present

## 2019-07-02 DIAGNOSIS — G629 Polyneuropathy, unspecified: Secondary | ICD-10-CM | POA: Diagnosis not present

## 2019-07-02 DIAGNOSIS — R531 Weakness: Secondary | ICD-10-CM | POA: Diagnosis not present

## 2019-07-02 DIAGNOSIS — Z7409 Other reduced mobility: Secondary | ICD-10-CM | POA: Diagnosis not present

## 2019-07-02 DIAGNOSIS — R2689 Other abnormalities of gait and mobility: Secondary | ICD-10-CM | POA: Diagnosis not present

## 2019-07-03 ENCOUNTER — Other Ambulatory Visit: Payer: Self-pay

## 2019-07-03 DIAGNOSIS — I480 Paroxysmal atrial fibrillation: Secondary | ICD-10-CM

## 2019-07-03 DIAGNOSIS — H16141 Punctate keratitis, right eye: Secondary | ICD-10-CM | POA: Diagnosis not present

## 2019-07-04 DIAGNOSIS — C678 Malignant neoplasm of overlapping sites of bladder: Secondary | ICD-10-CM | POA: Diagnosis not present

## 2019-07-06 DIAGNOSIS — G629 Polyneuropathy, unspecified: Secondary | ICD-10-CM | POA: Diagnosis not present

## 2019-07-06 DIAGNOSIS — R2689 Other abnormalities of gait and mobility: Secondary | ICD-10-CM | POA: Diagnosis not present

## 2019-07-06 DIAGNOSIS — Z7409 Other reduced mobility: Secondary | ICD-10-CM | POA: Diagnosis not present

## 2019-07-06 DIAGNOSIS — R531 Weakness: Secondary | ICD-10-CM | POA: Diagnosis not present

## 2019-07-06 DIAGNOSIS — R5381 Other malaise: Secondary | ICD-10-CM | POA: Diagnosis not present

## 2019-07-09 ENCOUNTER — Inpatient Hospital Stay (HOSPITAL_BASED_OUTPATIENT_CLINIC_OR_DEPARTMENT_OTHER): Payer: PPO | Admitting: Family

## 2019-07-09 ENCOUNTER — Other Ambulatory Visit: Payer: Self-pay

## 2019-07-09 ENCOUNTER — Inpatient Hospital Stay: Payer: PPO

## 2019-07-09 ENCOUNTER — Encounter: Payer: Self-pay | Admitting: Family

## 2019-07-09 ENCOUNTER — Telehealth: Payer: Self-pay | Admitting: Family

## 2019-07-09 VITALS — BP 116/84 | HR 95 | Temp 96.9°F | Resp 18 | Ht 74.0 in | Wt 280.0 lb

## 2019-07-09 DIAGNOSIS — Z5111 Encounter for antineoplastic chemotherapy: Secondary | ICD-10-CM | POA: Diagnosis not present

## 2019-07-09 DIAGNOSIS — C772 Secondary and unspecified malignant neoplasm of intra-abdominal lymph nodes: Secondary | ICD-10-CM

## 2019-07-09 DIAGNOSIS — D500 Iron deficiency anemia secondary to blood loss (chronic): Secondary | ICD-10-CM | POA: Diagnosis not present

## 2019-07-09 DIAGNOSIS — C671 Malignant neoplasm of dome of bladder: Secondary | ICD-10-CM

## 2019-07-09 DIAGNOSIS — C679 Malignant neoplasm of bladder, unspecified: Secondary | ICD-10-CM

## 2019-07-09 DIAGNOSIS — D508 Other iron deficiency anemias: Secondary | ICD-10-CM | POA: Diagnosis not present

## 2019-07-09 LAB — CBC WITH DIFFERENTIAL (CANCER CENTER ONLY)
Abs Immature Granulocytes: 0.03 10*3/uL (ref 0.00–0.07)
Basophils Absolute: 0 10*3/uL (ref 0.0–0.1)
Basophils Relative: 0 %
Eosinophils Absolute: 0.1 10*3/uL (ref 0.0–0.5)
Eosinophils Relative: 1 %
HCT: 27.4 % — ABNORMAL LOW (ref 39.0–52.0)
Hemoglobin: 8.8 g/dL — ABNORMAL LOW (ref 13.0–17.0)
Immature Granulocytes: 1 %
Lymphocytes Relative: 15 %
Lymphs Abs: 0.8 10*3/uL (ref 0.7–4.0)
MCH: 33.1 pg (ref 26.0–34.0)
MCHC: 32.1 g/dL (ref 30.0–36.0)
MCV: 103 fL — ABNORMAL HIGH (ref 80.0–100.0)
Monocytes Absolute: 0.6 10*3/uL (ref 0.1–1.0)
Monocytes Relative: 12 %
Neutro Abs: 3.7 10*3/uL (ref 1.7–7.7)
Neutrophils Relative %: 71 %
Platelet Count: 148 10*3/uL — ABNORMAL LOW (ref 150–400)
RBC: 2.66 MIL/uL — ABNORMAL LOW (ref 4.22–5.81)
RDW: 18.3 % — ABNORMAL HIGH (ref 11.5–15.5)
WBC Count: 5.2 10*3/uL (ref 4.0–10.5)
nRBC: 0 % (ref 0.0–0.2)

## 2019-07-09 LAB — CMP (CANCER CENTER ONLY)
ALT: 15 U/L (ref 0–44)
AST: 20 U/L (ref 15–41)
Albumin: 3.8 g/dL (ref 3.5–5.0)
Alkaline Phosphatase: 100 U/L (ref 38–126)
Anion gap: 7 (ref 5–15)
BUN: 31 mg/dL — ABNORMAL HIGH (ref 8–23)
CO2: 29 mmol/L (ref 22–32)
Calcium: 9 mg/dL (ref 8.9–10.3)
Chloride: 102 mmol/L (ref 98–111)
Creatinine: 2.11 mg/dL — ABNORMAL HIGH (ref 0.61–1.24)
GFR, Est AFR Am: 36 mL/min — ABNORMAL LOW (ref >60)
GFR, Estimated: 31 mL/min — ABNORMAL LOW (ref >60)
Glucose, Bld: 225 mg/dL — ABNORMAL HIGH (ref 70–99)
Potassium: 4.1 mmol/L (ref 3.5–5.1)
Sodium: 138 mmol/L (ref 135–145)
Total Bilirubin: 0.6 mg/dL (ref 0.3–1.2)
Total Protein: 5.9 g/dL — ABNORMAL LOW (ref 6.5–8.1)

## 2019-07-09 LAB — FERRITIN: Ferritin: 1141 ng/mL — ABNORMAL HIGH (ref 24–336)

## 2019-07-09 LAB — IRON AND TIBC
Iron: 64 ug/dL (ref 42–163)
Saturation Ratios: 26 % (ref 20–55)
TIBC: 251 ug/dL (ref 202–409)
UIBC: 187 ug/dL (ref 117–376)

## 2019-07-09 LAB — RETICULOCYTES
Immature Retic Fract: 27 % — ABNORMAL HIGH (ref 2.3–15.9)
RBC.: 2.68 MIL/uL — ABNORMAL LOW (ref 4.22–5.81)
Retic Count, Absolute: 106.4 10*3/uL (ref 19.0–186.0)
Retic Ct Pct: 4 % — ABNORMAL HIGH (ref 0.4–3.1)

## 2019-07-09 LAB — SAMPLE TO BLOOD BANK

## 2019-07-09 MED ORDER — SODIUM CHLORIDE 0.9 % IV SOLN
400.0000 mg/m2 | Freq: Once | INTRAVENOUS | Status: AC
  Administered 2019-07-09: 1000 mg via INTRAVENOUS
  Filled 2019-07-09: qty 10

## 2019-07-09 MED ORDER — SODIUM CHLORIDE 0.9 % IV SOLN
INTRAVENOUS | Status: DC
  Filled 2019-07-09 (×2): qty 250

## 2019-07-09 MED ORDER — PALONOSETRON HCL INJECTION 0.25 MG/5ML
0.2500 mg | Freq: Once | INTRAVENOUS | Status: AC
  Administered 2019-07-09: 0.25 mg via INTRAVENOUS

## 2019-07-09 MED ORDER — PALONOSETRON HCL INJECTION 0.25 MG/5ML
INTRAVENOUS | Status: AC
  Filled 2019-07-09: qty 5

## 2019-07-09 MED ORDER — SODIUM CHLORIDE 0.9% FLUSH
10.0000 mL | INTRAVENOUS | Status: DC | PRN
  Administered 2019-07-09: 10 mL
  Filled 2019-07-09: qty 10

## 2019-07-09 MED ORDER — DEXAMETHASONE SODIUM PHOSPHATE 10 MG/ML IJ SOLN
INTRAMUSCULAR | Status: AC
  Filled 2019-07-09: qty 1

## 2019-07-09 MED ORDER — DEXAMETHASONE SODIUM PHOSPHATE 10 MG/ML IJ SOLN
10.0000 mg | Freq: Once | INTRAMUSCULAR | Status: AC
  Administered 2019-07-09: 10 mg via INTRAVENOUS

## 2019-07-09 MED ORDER — HEPARIN SOD (PORK) LOCK FLUSH 100 UNIT/ML IV SOLN
500.0000 [IU] | Freq: Once | INTRAVENOUS | Status: AC | PRN
  Administered 2019-07-09: 500 [IU]
  Filled 2019-07-09: qty 5

## 2019-07-09 MED ORDER — SODIUM CHLORIDE 0.9 % IV SOLN
Freq: Once | INTRAVENOUS | Status: AC
  Filled 2019-07-09: qty 250

## 2019-07-09 MED ORDER — SODIUM CHLORIDE 0.9 % IV SOLN
Freq: Once | INTRAVENOUS | Status: AC
  Filled 2019-07-09: qty 60

## 2019-07-09 MED ORDER — SODIUM CHLORIDE 0.9 % IV SOLN
500.0000 mg/m2 | Freq: Once | INTRAVENOUS | Status: AC
  Administered 2019-07-09: 1254 mg via INTRAVENOUS
  Filled 2019-07-09: qty 32.98

## 2019-07-09 NOTE — Patient Instructions (Signed)
Ifosfamide injection What is this medicine? IFOSFAMIDE (eye FOS fa mide) is a chemotherapy drug. It slows the growth of cancer cells. This medicine is used to treat testicular cancer and other cancers. This medicine may be used for other purposes; ask your health care provider or pharmacist if you have questions. COMMON BRAND NAME(S): Ifex, Ifex/Mesna What should I tell my health care provider before I take this medicine? They need to know if you have any of these conditions:  heart disease  history of irregular heartbeat  immune system problems  infection (especially a virus infection such as chickenpox, cold sores, or herpes)  infections in the bladder, kidneys, or urinary tract  kidney disease  low blood counts, like low white cell, platelet, or red cell counts  lung or breathing disease, like asthma  problems urinating  recent or ongoing radiation therapy  tingling of the fingers or toes, or other nerve disorder  an unusual or allergic reaction to ifosfamide, other medicines, foods, dyes, or preservatives  pregnant or trying to get pregnant  breast-feeding How should I use this medicine? This drug is given as an infusion into a vein. It is administered in a hospital or clinic by a specially trained health care professional. Talk to your pediatrician regarding the use of this medicine in children. Special care may be needed. Overdosage: If you think you have taken too much of this medicine contact a poison control center or emergency room at once. NOTE: This medicine is only for you. Do not share this medicine with others. What if I miss a dose? It is important not to miss your dose. Call your doctor or health care professional if you are unable to keep an appointment. What may interact with this medicine? This medicine may interact with the following medications:  aprepitant, fosaprepitant  certain medicines for fungal infections like fluconazole, itraconazole, and  ketoconazole  certain medicines for seizures like carbamazepine, phenobarbital, phenytoin  grapefruit, grapefruit juice  rifampin  [place] Wort This list may not describe all possible interactions. Give your health care provider a list of all the medicines, herbs, non-prescription drugs, or dietary supplements you use. Also tell them if you smoke, drink alcohol, or use illegal drugs. Some items may interact with your medicine. What should I watch for while using this medicine? Visit your doctor for checks on your progress. This drug may make you feel generally unwell. This is not uncommon, as chemotherapy can affect healthy cells as well as cancer cells. Report any side effects. Continue your course of treatment even though you feel ill unless your doctor tells you to stop. Drink water or other fluids as directed. Urinate often, even at night. In some cases, you may be given additional medicines to help with side effects. Follow all directions for their use. Call your doctor or health care professional for advice if you get a fever, chills or sore throat, or other symptoms of a cold or flu. Do not treat yourself. This drug decreases your body's ability to fight infections. Try to avoid being around people who are sick. This medicine may increase your risk to bruise or bleed. Call your doctor or health care professional if you notice any unusual bleeding. Be careful brushing and flossing your teeth or using a toothpick because you may get an infection or bleed more easily. If you have any dental work done, tell your dentist you are receiving this medicine. Avoid taking products that contain aspirin, acetaminophen, ibuprofen, naproxen, or ketoprofen unless  instructed by your doctor. These medicines may hide a fever. Do not become pregnant while taking this medicine. Women should inform their doctor if they wish to become pregnant or think they might be pregnant. Men should not father a child while  taking this medicine and for 6 months after stopping it. There is a potential for serious side effects to an unborn child. Talk to your health care professional or pharmacist for more information. Do not breast-feed an infant while taking this medicine. This medicine has caused ovarian failure in some women. This medicine may interfere with the ability to have a child. Talk with your doctor or health care professional if you are concerned about your fertility. This medicine has caused reduced sperm counts in some men. This may interfere with the ability to father a child. You should talk to your doctor or health care professional if you are concerned about your fertility. What side effects may I notice from receiving this medicine? Side effects that you should report to your doctor or health care professional as soon as possible:  allergic reactions like skin rash, itching or hives, swelling of the face, lips, or tongue  breathing problems  chest pain or palpitations  confusion  dizziness  hallucination, loss of contact with reality  loss of balance or coordination  nausea, vomiting  pain, tingling, numbness in the hands or feet  seizures  signs of decreased platelets or bleeding - bruising, pinpoint red spots on the skin, black, tarry stools, blood in the urine  signs of decreased red blood cells - unusually weak or tired, feeling faint or lightheaded, falls  signs and symptoms of infection like fever or chills; cough; sore throat; pain or trouble passing urine  signs and symptoms of kidney injury like trouble passing urine or change in the amount of urine  signs and symptoms of liver injury like dark yellow or brown urine; general ill feeling or flu-like symptoms; light-colored stools; loss of appetite; nausea; right upper belly pain; unusually weak or tired; yellowing of the eyes or skin  swelling of the legs or ankles  tremors Side effects that usually do not require  medical attention (report to your doctor or health care professional if they continue or are bothersome):  changes in vision  constipation  decreased hearing  diarrhea  hair loss  headache  loss of appetite  loss of memory  mouth sores  rash This list may not describe all possible side effects. Call your doctor for medical advice about side effects. You may report side effects to FDA at 1-800-FDA-1088. Where should I keep my medicine? This drug is given in a hospital or clinic and will not be stored at home. NOTE: This sheet is a summary. It may not cover all possible information. If you have questions about this medicine, talk to your doctor, pharmacist, or health care provider.  2020 Elsevier/Gold Standard (2016-11-22 09:57:40) Mesna injection What is this medicine? MESNA (MES na) is used to prevent bleeding from the bladder during treatment with ifosfamide. This medicine does not reduce the chance of getting other side effects of cancer chemotherapy. This medicine may be used for other purposes; ask your health care provider or pharmacist if you have questions. COMMON BRAND NAME(S): Mesnex What should I tell my health care provider before I take this medicine? They need to know if you have any of these conditions:  autoimmune disease like lupus, nephritis, or rheumatoid arthritis  an unusual or allergic reaction to mesna, benzyl  alcohol, sulfur medicines, other medicines, foods, dyes, or preservatives  pregnant or trying to get pregnant  breast-feeding How should I use this medicine? This medicine is for infusion into a vein. It is given by a health care professional in a hospital or clinic setting. Talk to your pediatrician regarding the use of this medicine in children. Special care may be needed. Overdosage: If you think you have taken too much of this medicine contact a poison control center or emergency room at once. NOTE: This medicine is only for you. Do not  share this medicine with others. What if I miss a dose? This does not apply. What may interact with this medicine? Interactions are not expected. This list may not describe all possible interactions. Give your health care provider a list of all the medicines, herbs, non-prescription drugs, or dietary supplements you use. Also tell them if you smoke, drink alcohol, or use illegal drugs. Some items may interact with your medicine. What should I watch for while using this medicine? Your doctor will follow your condition closely while you are taking this medicine. Tell your doctor right away if you see that your urine has turned a pink or red color. It is important to drink at least a quart (4 cups) of fluids each day that you take this medicine. This medicine may cause serious skin reactions. They can happen weeks to months after starting the medicine. Contact your health care provider right away if you notice fevers or flu-like symptoms with a rash. The rash may be red or purple and then turn into blisters or peeling of the skin. Or, you might notice a red rash with swelling of the face, lips or lymph nodes in your neck or under your arms. What side effects may I notice from receiving this medicine? Side effects that you should report to your doctor or health care professional as soon as possible:  allergic reactions like skin rash, itching or hives, swelling of the face, lips, or tongue  breathing problems  blood in your urine or pink to red colored urine  fever, chills, or sore throat  flushing or redness to skin  mouth sores  pain or redness at site where injected  redness, blistering, peeling or loosening of the skin, including inside the mouth  swelling of ankles or feet  vomiting Side effects that usually do not require medical attention (report to your doctor or health care professional if they continue or are bothersome):  aches and pains  bad taste in  mouth  diarrhea  dizziness  hair loss  headache  nausea This list may not describe all possible side effects. Call your doctor for medical advice about side effects. You may report side effects to FDA at 1-800-FDA-1088. Where should I keep my medicine? This drug is given in a hospital or clinic and will not be stored at home. NOTE: This sheet is a summary. It may not cover all possible information. If you have questions about this medicine, talk to your doctor, pharmacist, or health care provider.  2020 Elsevier/Gold Standard (2018-06-30 15:28:46) Gemcitabine injection What is this medicine? GEMCITABINE (jem SYE ta been) is a chemotherapy drug. This medicine is used to treat many types of cancer like breast cancer, lung cancer, pancreatic cancer, and ovarian cancer. This medicine may be used for other purposes; ask your health care provider or pharmacist if you have questions. COMMON BRAND NAME(S): Gemzar, Infugem What should I tell my health care provider before I take this  medicine? They need to know if you have any of these conditions:  blood disorders  infection  kidney disease  liver disease  lung or breathing disease, like asthma  recent or ongoing radiation therapy  an unusual or allergic reaction to gemcitabine, other chemotherapy, other medicines, foods, dyes, or preservatives  pregnant or trying to get pregnant  breast-feeding How should I use this medicine? This drug is given as an infusion into a vein. It is administered in a hospital or clinic by a specially trained health care professional. Talk to your pediatrician regarding the use of this medicine in children. Special care may be needed. Overdosage: If you think you have taken too much of this medicine contact a poison control center or emergency room at once. NOTE: This medicine is only for you. Do not share this medicine with others. What if I miss a dose? It is important not to miss your dose. Call  your doctor or health care professional if you are unable to keep an appointment. What may interact with this medicine?  medicines to increase blood counts like filgrastim, pegfilgrastim, sargramostim  some other chemotherapy drugs like cisplatin  vaccines Talk to your doctor or health care professional before taking any of these medicines:  acetaminophen  aspirin  ibuprofen  ketoprofen  naproxen This list may not describe all possible interactions. Give your health care provider a list of all the medicines, herbs, non-prescription drugs, or dietary supplements you use. Also tell them if you smoke, drink alcohol, or use illegal drugs. Some items may interact with your medicine. What should I watch for while using this medicine? Visit your doctor for checks on your progress. This drug may make you feel generally unwell. This is not uncommon, as chemotherapy can affect healthy cells as well as cancer cells. Report any side effects. Continue your course of treatment even though you feel ill unless your doctor tells you to stop. In some cases, you may be given additional medicines to help with side effects. Follow all directions for their use. Call your doctor or health care professional for advice if you get a fever, chills or sore throat, or other symptoms of a cold or flu. Do not treat yourself. This drug decreases your body's ability to fight infections. Try to avoid being around people who are sick. This medicine may increase your risk to bruise or bleed. Call your doctor or health care professional if you notice any unusual bleeding. Be careful brushing and flossing your teeth or using a toothpick because you may get an infection or bleed more easily. If you have any dental work done, tell your dentist you are receiving this medicine. Avoid taking products that contain aspirin, acetaminophen, ibuprofen, naproxen, or ketoprofen unless instructed by your doctor. These medicines may hide a  fever. Do not become pregnant while taking this medicine or for 6 months after stopping it. Women should inform their doctor if they wish to become pregnant or think they might be pregnant. Men should not father a child while taking this medicine and for 3 months after stopping it. There is a potential for serious side effects to an unborn child. Talk to your health care professional or pharmacist for more information. Do not breast-feed an infant while taking this medicine or for at least 1 week after stopping it. Men should inform their doctors if they wish to father a child. This medicine may lower sperm counts. Talk with your doctor or health care professional if you are  concerned about your fertility. What side effects may I notice from receiving this medicine? Side effects that you should report to your doctor or health care professional as soon as possible:  allergic reactions like skin rash, itching or hives, swelling of the face, lips, or tongue  breathing problems  pain, redness, or irritation at site where injected  signs and symptoms of a dangerous change in heartbeat or heart rhythm like chest pain; dizziness; fast or irregular heartbeat; palpitations; feeling faint or lightheaded, falls; breathing problems  signs of decreased platelets or bleeding - bruising, pinpoint red spots on the skin, black, tarry stools, blood in the urine  signs of decreased red blood cells - unusually weak or tired, feeling faint or lightheaded, falls  signs of infection - fever or chills, cough, sore throat, pain or difficulty passing urine  signs and symptoms of kidney injury like trouble passing urine or change in the amount of urine  signs and symptoms of liver injury like dark yellow or brown urine; general ill feeling or flu-like symptoms; light-colored stools; loss of appetite; nausea; right upper belly pain; unusually weak or tired; yellowing of the eyes or skin  swelling of ankles, feet,  hands Side effects that usually do not require medical attention (report to your doctor or health care professional if they continue or are bothersome):  constipation  diarrhea  hair loss  loss of appetite  nausea  rash  vomiting This list may not describe all possible side effects. Call your doctor for medical advice about side effects. You may report side effects to FDA at 1-800-FDA-1088. Where should I keep my medicine? This drug is given in a hospital or clinic and will not be stored at home. NOTE: This sheet is a summary. It may not cover all possible information. If you have questions about this medicine, talk to your doctor, pharmacist, or health care provider.  2020 Elsevier/Gold Standard (2017-06-22 18:06:11)

## 2019-07-09 NOTE — Patient Instructions (Signed)

## 2019-07-09 NOTE — Telephone Encounter (Signed)
Appointments scheduled patie to get updated schedule at 3/30 visit/ per 3/29 los

## 2019-07-09 NOTE — Progress Notes (Signed)
Ok to treat with creatinine of  2.11 per Dr Marin Olp. dph

## 2019-07-09 NOTE — Progress Notes (Addendum)
Hematology and Oncology Follow Up Visit  [name] Rose [id number] 10-09-1950 69 y.o. 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 Taxotere 858mm2 IV q 3 wks - s/p cycle #3 - d/c due to progression M-VAC - s/p cycle#8  -- d/c on 07/10/2018 due to blood counts Padcev -- s/p cycle #1 on 07/17/2018 d/c 08/28/2018  Current Therapy:        Ifosfamide/Gemzar -- s/p cycle # 3 - started 04/16/2019 IV iron as indicated    Interim History:  [name] Rose here today for follow-up and treatment. He is doing fairly well but is still having a hard time with the neuropathy pain in his lower extremities. He has had to cut back on the Neurontin due to his kidney function.  Creatinine today was 2.11 and BUN 31. He recently started Myerbetriq which seems to be helping his urinary frequency and urgency.  He is currently in PT 1-2 days a week. He takes a break after treatment and does not start back until the middle of the following week.  He states that he has had a stress test with cardiology and may be having another cardioversion at some point for the atrial fib.  No fever, chills, n/v, cough, rash, dizziness, chest pain, palpitations, abdominal pain or changes in bowel habits.  The edema in his lower extremities is stable. Pedal pulses are 2+.  With the left foot drop and neuropathy he does stumble occasionally but denies any falls.  No episodes of bleeding. No bruising or petechiae.  He has maintained a good appetite but does lose it for several days after each treatment. He is doing his best to stay well hydrated. No weight loss. Weight is now 280 lbs.   ECOG Performance Status: 1 - Symptomatic but completely ambulatory  Medications:  Allergies as of 07/09/2019   No Known Allergies     Medication List       Accurate as of July 09, 2019 10:17 AM. If you have any questions, ask your nurse or doctor.        amoxicillin 500 MG capsule Commonly known as: AMOXIL SMARTSIG:4 Capsule(s) By Mouth Once   apixaban 5 MG Tabs tablet Commonly known as: Eliquis Take 1 tablet (5 mg total) by mouth 2 (two) times daily.   atorvastatin 40 MG tablet Commonly known as: LIPITOR Take 40 mg by mouth every evening.   ciprofloxacin 0.3 % ophthalmic solution Commonly known as: CILOXAN Place 1 drop into the right eye 2 (two) times daily.   dexamethasone 4 MG tablet Commonly known as: DECADRON Take 2 tablets (8 mg total) by mouth daily. Start the day after chemotherapy for 2 days. Take with food.   diltiazem 360 MG 24 hr capsule Commonly known as: CARDIZEM CD TAKE 1 CAPSULE BY MOUTH EVERY DAY   fluconazole 100 MG tablet Commonly known as: DIFLUCAN Take 100 mg by mouth at bedtime.   gabapentin 400 MG capsule Commonly known as: NEURONTIN TAKE 1 CAPSULE (400 MG TOTAL) BY MOUTH 4 (FOUR) TIMES DAILY. What changed:   how much to take  when to take this  additional instructions   LORazepam 0.5 MG tablet Commonly known as: Ativan Take 1 tablet (0.5 mg total) by mouth every 6 (six) hours as needed (Nausea or vomiting).   magnesium oxide 400 MG tablet Commonly known as:  MAG-OX Take 400 mg by mouth 2 (two) times daily.   metoprolol succinate 25 MG 24 hr tablet Commonly known as: TOPROL-XL Take 25 mg by mouth at bedtime.   ondansetron 8 MG tablet Commonly known as: Zofran Take 1 tablet (8 mg total) by mouth 2 (two) times daily as needed for refractory nausea / vomiting. Start 2 days after chemotherapy.   OneTouch Delica Lancets 38G Misc 2 (two) times daily. as directed   [place] w/Device Kit See admin instructions.   OneTouch Verio test strip Generic drug: glucose blood 2 (two) times daily. for testing   Potassium Chloride ER 20 MEQ Tbcr Take 20 mEq by mouth daily with breakfast.     potassium chloride SA 20 MEQ tablet Commonly known as: KLOR-CON Take 20 mEq by mouth every morning.   prednisoLONE acetate 1 % ophthalmic suspension Commonly known as: PRED FORTE   prochlorperazine 10 MG tablet Commonly known as: COMPAZINE Take 1 tablet (10 mg total) by mouth every 6 (six) hours as needed (Nausea or vomiting).   pyridoxine 100 MG tablet Commonly known as: B-6 Take 200 mg by mouth daily with breakfast.   traMADol 50 MG tablet Commonly known as: ULTRAM Take 50 mg by mouth daily as needed (pain.).   Vitamin D (Ergocalciferol) 1.25 MG (50000 UNIT) Caps capsule Commonly known as: DRISDOL Take 50,000 Units by mouth every Saturday.       Allergies: No Known Allergies  Past Medical History, Surgical history, Social history, and Family History were reviewed and updated.  Review of Systems: All other 10 point review of systems is negative.   Physical Exam:  height is '6\' 2"'  (1.88 m) and weight is 280 lb (127 kg). His temporal temperature is 96.9 F (36.1 C) (abnormal). His blood pressure is 116/84 and his pulse is 95. His respiration is 18 and oxygen saturation is 98%.   Wt Readings from Last 3 Encounters:  07/09/19 280 lb (127 kg)  06/27/19 278 lb (126.1 kg)  06/18/19 278 lb (126.1 kg)    Ocular: Sclerae unicteric, pupils equal, round and reactive to light Ear-nose-throat: Oropharynx clear, dentition fair Lymphatic: No cervical or supraclavicular adenopathy Lungs no rales or rhonchi, good excursion bilaterally Heart irregular rate and rhythm, atrial fib, no murmur appreciated Abd soft, nontender, positive bowel sounds, no liver or spleen tip palpated on exam, no fluid wave  MSK no focal spinal tenderness, no joint edema Neuro: non-focal, well-oriented, appropriate affect Breasts: Deferred   Lab Results  Component Value Date   WBC 5.2 07/09/2019   HGB 8.8 (L) 07/09/2019   HCT 27.4 (L) 07/09/2019   MCV 103.0 (H) 07/09/2019   PLT 148 (L) 07/09/2019    Lab Results  Component Value Date   FERRITIN 1,294 (H) 06/18/2019   IRON 54 06/18/2019   TIBC 239 06/18/2019   UIBC 185 06/18/2019   IRONPCTSAT 23 06/18/2019   Lab Results  Component Value Date   RETICCTPCT 4.0 (H) 07/09/2019   RBC 2.68 (L) 07/09/2019   No results found for: KPAFRELGTCHN, LAMBDASER, KAPLAMBRATIO No results found for: IGGSERUM, IGA, IGMSERUM No results found for: [name], SPEI   Chemistry      Component Value Date/Time   NA 139 06/18/2019 0815   NA 140 05/15/2019 1109   NA 141 02/21/2017 0902   K 3.9 06/18/2019 0815   K 4.1 02/21/2017 0902   CL 103 06/18/2019 0815   CL 98 02/21/2017 0902  CO2 27 06/18/2019 0815   CO2 31 02/21/2017 0902   BUN 26 (H) 06/18/2019 0815   BUN 36 (H) 05/15/2019 1109   BUN 17 02/21/2017 0902   CREATININE 1.98 (H) 06/18/2019 0815   CREATININE 1.2 02/21/2017 0902      Component Value Date/Time   CALCIUM 8.8 (L) 06/18/2019 0815   CALCIUM 9.2 02/21/2017 0902   ALKPHOS 96 06/18/2019 0815   ALKPHOS 65 02/21/2017 0902   AST 21 06/18/2019 0815   ALT 17 06/18/2019 0815   ALT 18 02/21/2017 0902   BILITOT 0.5 06/18/2019 0815       Impression and Plan: Mr. Trivedi is a very pleasant 69 yo caucasian gentleman with metastatic high grade bladder cancer.  We will proceed with treatment today as planned.  Lab work reviewed with MD prior to releasing his chemo.  Hgb is stable at 8.8, platelet 148, WBC count 5.2. Creatinine 2.11 and BUN 31.  We will plan to see him back in another 3 week.  He will contact our office with any questions or concerns. We can certainly see him sooner if needed.   Laverna Peace, NP 3/29/202110:17 AM

## 2019-07-10 ENCOUNTER — Telehealth: Payer: Self-pay | Admitting: Internal Medicine

## 2019-07-10 ENCOUNTER — Inpatient Hospital Stay: Payer: PPO

## 2019-07-10 ENCOUNTER — Other Ambulatory Visit: Payer: Self-pay | Admitting: Urology

## 2019-07-10 VITALS — BP 111/71 | HR 85 | Temp 97.3°F | Resp 18

## 2019-07-10 DIAGNOSIS — Z5111 Encounter for antineoplastic chemotherapy: Secondary | ICD-10-CM | POA: Diagnosis not present

## 2019-07-10 DIAGNOSIS — C772 Secondary and unspecified malignant neoplasm of intra-abdominal lymph nodes: Secondary | ICD-10-CM

## 2019-07-10 DIAGNOSIS — C679 Malignant neoplasm of bladder, unspecified: Secondary | ICD-10-CM

## 2019-07-10 MED ORDER — SODIUM CHLORIDE 0.9% FLUSH
10.0000 mL | INTRAVENOUS | Status: DC | PRN
  Administered 2019-07-10: 10 mL
  Filled 2019-07-10: qty 10

## 2019-07-10 MED ORDER — SODIUM CHLORIDE 0.9 % IV SOLN
400.0000 mg/m2 | Freq: Once | INTRAVENOUS | Status: AC
  Administered 2019-07-10: 1000 mg via INTRAVENOUS
  Filled 2019-07-10: qty 10

## 2019-07-10 MED ORDER — DEXAMETHASONE SODIUM PHOSPHATE 10 MG/ML IJ SOLN
10.0000 mg | Freq: Once | INTRAMUSCULAR | Status: AC
  Administered 2019-07-10: 10 mg via INTRAVENOUS

## 2019-07-10 MED ORDER — SODIUM CHLORIDE 0.9 % IV SOLN
150.0000 mg | Freq: Once | INTRAVENOUS | Status: AC
  Administered 2019-07-10: 150 mg via INTRAVENOUS
  Filled 2019-07-10: qty 150

## 2019-07-10 MED ORDER — HEPARIN SOD (PORK) LOCK FLUSH 100 UNIT/ML IV SOLN
500.0000 [IU] | Freq: Once | INTRAVENOUS | Status: AC | PRN
  Administered 2019-07-10: 500 [IU]
  Filled 2019-07-10: qty 5

## 2019-07-10 MED ORDER — DEXAMETHASONE SODIUM PHOSPHATE 10 MG/ML IJ SOLN
INTRAMUSCULAR | Status: AC
  Filled 2019-07-10: qty 1

## 2019-07-10 MED ORDER — SODIUM CHLORIDE 0.9 % IV SOLN
Freq: Once | INTRAVENOUS | Status: AC
  Filled 2019-07-10: qty 250

## 2019-07-10 MED ORDER — SODIUM CHLORIDE 0.9 % IV SOLN
Freq: Once | INTRAVENOUS | Status: AC
  Filled 2019-07-10: qty 60

## 2019-07-10 NOTE — Telephone Encounter (Signed)
Patient of yours with PAF, diagnosed 03/2019. Had DCCV after being on Eliquis, on 05/18/2019. Successful but returned atrial fib. Now on Flecainide after normal stress test for ischemia. Plan was to try DCCV again.  Received pre-op clearance note from urology, Dr. 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  Can you weigh in on whether we can stop the Eliquis for 48 hours prior to surgery, as this will interrupt anticoagulation prior to DCCV.  He has not had EKG since Flecainide started. Seen last by Cherlynn Polo the beginning of March.   Thank you.  KL

## 2019-07-10 NOTE — Telephone Encounter (Signed)
   [facility] Pre-operative Risk Assessment    Request for surgical clearance:  1. What type of surgery is being performed?  Bladder Tumor   2. When is this surgery scheduled? 08-06-19   3. What type of clearance is required (medical clearance vs. Pharmacy clearance to hold med vs. Both)? Both  4. Are there any medications that need to be held prior to surgery and how long? Eliquis can hold 48 hrs prior to surgery   5. Practice name and name of physician performing surgery? Dr Dorina Hoyer   6. What is your office phone number [phone]   7.   What is your office fax number [phone]  8.   Anesthesia type (None, local, MAC, general) ? General   Glyn Ade 07/10/2019, 3:49 PM  _________________________________________________________________   (provider comments below)

## 2019-07-10 NOTE — Telephone Encounter (Signed)
Well, I would say it is probably more important to get the tumor out first. He has already had 1 month of anticoagulation that we would recommend Would pursue that, hold Eliquis 2-3 days prior and restart after - then reset the clock for another month of anticoagulation and trial of DCCV at that point if he is still in afib.  How does that sound?  -Mali

## 2019-07-10 NOTE — Patient Instructions (Signed)
[facility] Discharge Instructions for Patients Receiving Chemotherapy  Today you received the following chemotherapy agents: Ifosphamide and Mesna  To help prevent nausea and vomiting after your treatment, we encourage you to take your nausea medication as prescribed by MD.   If you develop nausea and vomiting that is not controlled by your nausea medication, call the clinic.   BELOW ARE SYMPTOMS THAT SHOULD BE REPORTED IMMEDIATELY:  *FEVER GREATER THAN 100.5 F  *CHILLS WITH OR WITHOUT FEVER  NAUSEA AND VOMITING THAT IS NOT CONTROLLED WITH YOUR NAUSEA MEDICATION  *UNUSUAL SHORTNESS OF BREATH  *UNUSUAL BRUISING OR BLEEDING  TENDERNESS IN MOUTH AND THROAT WITH OR WITHOUT PRESENCE OF ULCERS  *URINARY PROBLEMS  *BOWEL PROBLEMS  UNUSUAL RASH Items with * indicate a potential emergency and should be followed up as soon as possible.  Feel free to call the clinic should you have any questions or concerns. The clinic phone number is [phone].  Please show the [facility] at check-in to the Emergency Department and triage nurse.

## 2019-07-11 ENCOUNTER — Other Ambulatory Visit: Payer: Self-pay

## 2019-07-11 ENCOUNTER — Inpatient Hospital Stay: Payer: PPO

## 2019-07-11 VITALS — BP 148/90 | HR 98 | Temp 97.6°F | Resp 20

## 2019-07-11 DIAGNOSIS — C679 Malignant neoplasm of bladder, unspecified: Secondary | ICD-10-CM

## 2019-07-11 DIAGNOSIS — C772 Secondary and unspecified malignant neoplasm of intra-abdominal lymph nodes: Secondary | ICD-10-CM

## 2019-07-11 DIAGNOSIS — Z5111 Encounter for antineoplastic chemotherapy: Secondary | ICD-10-CM | POA: Diagnosis not present

## 2019-07-11 MED ORDER — SODIUM CHLORIDE 0.9 % IV SOLN
Freq: Once | INTRAVENOUS | Status: AC
  Filled 2019-07-11: qty 60

## 2019-07-11 MED ORDER — PALONOSETRON HCL INJECTION 0.25 MG/5ML
0.2500 mg | Freq: Once | INTRAVENOUS | Status: AC
  Administered 2019-07-11: 0.25 mg via INTRAVENOUS

## 2019-07-11 MED ORDER — PEGFILGRASTIM 6 MG/0.6ML ~~LOC~~ PSKT
PREFILLED_SYRINGE | SUBCUTANEOUS | Status: AC
  Filled 2019-07-11: qty 0.6

## 2019-07-11 MED ORDER — SODIUM CHLORIDE 0.9 % IV SOLN
Freq: Once | INTRAVENOUS | Status: DC
  Filled 2019-07-11: qty 250

## 2019-07-11 MED ORDER — HEPARIN SOD (PORK) LOCK FLUSH 100 UNIT/ML IV SOLN
500.0000 [IU] | Freq: Once | INTRAVENOUS | Status: AC | PRN
  Administered 2019-07-11: 500 [IU]
  Filled 2019-07-11: qty 5

## 2019-07-11 MED ORDER — PEGFILGRASTIM 6 MG/0.6ML ~~LOC~~ PSKT
6.0000 mg | PREFILLED_SYRINGE | Freq: Once | SUBCUTANEOUS | Status: AC
  Administered 2019-07-11: 6 mg via SUBCUTANEOUS

## 2019-07-11 MED ORDER — PALONOSETRON HCL INJECTION 0.25 MG/5ML
INTRAVENOUS | Status: AC
  Filled 2019-07-11: qty 5

## 2019-07-11 MED ORDER — SODIUM CHLORIDE 0.9 % IV SOLN
400.0000 mg/m2 | Freq: Once | INTRAVENOUS | Status: AC
  Administered 2019-07-11: 1000 mg via INTRAVENOUS
  Filled 2019-07-11: qty 10

## 2019-07-11 MED ORDER — SODIUM CHLORIDE 0.9% FLUSH
10.0000 mL | INTRAVENOUS | Status: DC | PRN
  Administered 2019-07-11: 10 mL
  Filled 2019-07-11: qty 10

## 2019-07-11 MED ORDER — HOT PACK MISC ONCOLOGY
1.0000 | Freq: Once | Status: DC | PRN
  Filled 2019-07-11: qty 1

## 2019-07-11 MED ORDER — DEXAMETHASONE SODIUM PHOSPHATE 10 MG/ML IJ SOLN
10.0000 mg | Freq: Once | INTRAMUSCULAR | Status: AC
  Administered 2019-07-11: 10 mg via INTRAVENOUS

## 2019-07-11 MED ORDER — DEXAMETHASONE SODIUM PHOSPHATE 10 MG/ML IJ SOLN
INTRAMUSCULAR | Status: AC
  Filled 2019-07-11: qty 1

## 2019-07-11 NOTE — Patient Instructions (Signed)
Ifosfamide injection What is this medicine? IFOSFAMIDE (eye FOS fa mide) is a chemotherapy drug. It slows the growth of cancer cells. This medicine is used to treat testicular cancer and other cancers. This medicine may be used for other purposes; ask your health care provider or pharmacist if you have questions. COMMON BRAND NAME(S): Ifex, Ifex/Mesna What should I tell my health care provider before I take this medicine? They need to know if you have any of these conditions:  heart disease  history of irregular heartbeat  immune system problems  infection (especially a virus infection such as chickenpox, cold sores, or herpes)  infections in the bladder, kidneys, or urinary tract  kidney disease  low blood counts, like low white cell, platelet, or red cell counts  lung or breathing disease, like asthma  problems urinating  recent or ongoing radiation therapy  tingling of the fingers or toes, or other nerve disorder  an unusual or allergic reaction to ifosfamide, other medicines, foods, dyes, or preservatives  pregnant or trying to get pregnant  breast-feeding How should I use this medicine? This drug is given as an infusion into a vein. It is administered in a hospital or clinic by a specially trained health care professional. Talk to your pediatrician regarding the use of this medicine in children. Special care may be needed. Overdosage: If you think you have taken too much of this medicine contact a poison control center or emergency room at once. NOTE: This medicine is only for you. Do not share this medicine with others. What if I miss a dose? It is important not to miss your dose. Call your doctor or health care professional if you are unable to keep an appointment. What may interact with this medicine? This medicine may interact with the following medications:  aprepitant, fosaprepitant  certain medicines for fungal infections like fluconazole, itraconazole, and  ketoconazole  certain medicines for seizures like carbamazepine, phenobarbital, phenytoin  grapefruit, grapefruit juice  rifampin  [place]. Johns Wort This list may not describe all possible interactions. Give your health care provider a list of all the medicines, herbs, non-prescription drugs, or dietary supplements you use. Also tell them if you smoke, drink alcohol, or use illegal drugs. Some items may interact with your medicine. What should I watch for while using this medicine? Visit your doctor for checks on your progress. This drug may make you feel generally unwell. This is not uncommon, as chemotherapy can affect healthy cells as well as cancer cells. Report any side effects. Continue your course of treatment even though you feel ill unless your doctor tells you to stop. Drink water or other fluids as directed. Urinate often, even at night. In some cases, you may be given additional medicines to help with side effects. Follow all directions for their use. Call your doctor or health care professional for advice if you get a fever, chills or sore throat, or other symptoms of a cold or flu. Do not treat yourself. This drug decreases your body's ability to fight infections. Try to avoid being around people who are sick. This medicine may increase your risk to bruise or bleed. Call your doctor or health care professional if you notice any unusual bleeding. Be careful brushing and flossing your teeth or using a toothpick because you may get an infection or bleed more easily. If you have any dental work done, tell your dentist you are receiving this medicine. Avoid taking products that contain aspirin, acetaminophen, ibuprofen, naproxen, or ketoprofen unless  instructed by your doctor. These medicines may hide a fever. Do not become pregnant while taking this medicine. Women should inform their doctor if they wish to become pregnant or think they might be pregnant. Men should not father a child while  taking this medicine and for 6 months after stopping it. There is a potential for serious side effects to an unborn child. Talk to your health care professional or pharmacist for more information. Do not breast-feed an infant while taking this medicine. This medicine has caused ovarian failure in some women. This medicine may interfere with the ability to have a child. Talk with your doctor or health care professional if you are concerned about your fertility. This medicine has caused reduced sperm counts in some men. This may interfere with the ability to father a child. You should talk to your doctor or health care professional if you are concerned about your fertility. What side effects may I notice from receiving this medicine? Side effects that you should report to your doctor or health care professional as soon as possible:  allergic reactions like skin rash, itching or hives, swelling of the face, lips, or tongue  breathing problems  chest pain or palpitations  confusion  dizziness  hallucination, loss of contact with reality  loss of balance or coordination  nausea, vomiting  pain, tingling, numbness in the hands or feet  seizures  signs of decreased platelets or bleeding - bruising, pinpoint red spots on the skin, black, tarry stools, blood in the urine  signs of decreased red blood cells - unusually weak or tired, feeling faint or lightheaded, falls  signs and symptoms of infection like fever or chills; cough; sore throat; pain or trouble passing urine  signs and symptoms of kidney injury like trouble passing urine or change in the amount of urine  signs and symptoms of liver injury like dark yellow or brown urine; general ill feeling or flu-like symptoms; light-colored stools; loss of appetite; nausea; right upper belly pain; unusually weak or tired; yellowing of the eyes or skin  swelling of the legs or ankles  tremors Side effects that usually do not require  medical attention (report to your doctor or health care professional if they continue or are bothersome):  changes in vision  constipation  decreased hearing  diarrhea  hair loss  headache  loss of appetite  loss of memory  mouth sores  rash This list may not describe all possible side effects. Call your doctor for medical advice about side effects. You may report side effects to FDA at 1-800-FDA-1088. Where should I keep my medicine? This drug is given in a hospital or clinic and will not be stored at home. NOTE: This sheet is a summary. It may not cover all possible information. If you have questions about this medicine, talk to your doctor, pharmacist, or health care provider.  2020 Elsevier/Gold Standard (2016-11-22 09:57:40)

## 2019-07-11 NOTE — Telephone Encounter (Signed)
   Primary Cardiologist: Pixie Casino, MD  Chart reviewed as part of pre-operative protocol coverage. Given past medical history and time since last visit, based on ACC/AHA guidelines, [name] Rose would be at acceptable risk for the planned procedure without further cardiovascular testing.   I have confirmed the plan to hold Eliquis with [name]. He is in agreement to proceed with surgery and hold Eliquis 48 hours prior to tumor removal from the bladder. He will need to restart the Eliquis ASAP there after. (See below note from [name]).   I will route this recommendation to the requesting party via Epic fax function and remove from pre-op pool.  Please call with questions.  Phill Myron. West Pugh, ANP, AACC  07/11/2019, 8:18 AM

## 2019-07-12 DIAGNOSIS — H16141 Punctate keratitis, right eye: Secondary | ICD-10-CM | POA: Diagnosis not present

## 2019-07-13 ENCOUNTER — Encounter (HOSPITAL_COMMUNITY): Payer: Self-pay | Admitting: Nurse Practitioner

## 2019-07-13 ENCOUNTER — Other Ambulatory Visit: Payer: Self-pay

## 2019-07-13 ENCOUNTER — Ambulatory Visit (HOSPITAL_COMMUNITY)
Admission: RE | Admit: 2019-07-13 | Discharge: 2019-07-13 | Disposition: A | Discharge status: Home / Self Care | Payer: PPO | Source: Ambulatory Visit | Attending: Nurse Practitioner | Admitting: Nurse Practitioner

## 2019-07-13 VITALS — BP 144/80 | HR 94 | Ht 74.0 in | Wt 287.8 lb

## 2019-07-13 DIAGNOSIS — Z7901 Long term (current) use of anticoagulants: Secondary | ICD-10-CM | POA: Diagnosis not present

## 2019-07-13 DIAGNOSIS — Z79899 Other long term (current) drug therapy: Secondary | ICD-10-CM | POA: Insufficient documentation

## 2019-07-13 DIAGNOSIS — E782 Mixed hyperlipidemia: Secondary | ICD-10-CM | POA: Insufficient documentation

## 2019-07-13 DIAGNOSIS — Z8546 Personal history of malignant neoplasm of prostate: Secondary | ICD-10-CM | POA: Diagnosis not present

## 2019-07-13 DIAGNOSIS — C772 Secondary and unspecified malignant neoplasm of intra-abdominal lymph nodes: Secondary | ICD-10-CM | POA: Insufficient documentation

## 2019-07-13 DIAGNOSIS — Z8551 Personal history of malignant neoplasm of bladder: Secondary | ICD-10-CM | POA: Insufficient documentation

## 2019-07-13 DIAGNOSIS — R7303 Prediabetes: Secondary | ICD-10-CM | POA: Insufficient documentation

## 2019-07-13 DIAGNOSIS — I4819 Other persistent atrial fibrillation: Secondary | ICD-10-CM

## 2019-07-13 DIAGNOSIS — D6869 Other thrombophilia: Secondary | ICD-10-CM | POA: Diagnosis not present

## 2019-07-13 DIAGNOSIS — Z87891 Personal history of nicotine dependence: Secondary | ICD-10-CM | POA: Insufficient documentation

## 2019-07-13 DIAGNOSIS — Z8249 Family history of ischemic heart disease and other diseases of the circulatory system: Secondary | ICD-10-CM | POA: Diagnosis not present

## 2019-07-13 DIAGNOSIS — E785 Hyperlipidemia, unspecified: Secondary | ICD-10-CM | POA: Diagnosis not present

## 2019-07-13 DIAGNOSIS — I4891 Unspecified atrial fibrillation: Secondary | ICD-10-CM | POA: Insufficient documentation

## 2019-07-13 DIAGNOSIS — I10 Essential (primary) hypertension: Secondary | ICD-10-CM | POA: Diagnosis not present

## 2019-07-13 NOTE — Progress Notes (Signed)
Primary Care Physician: Shirline Frees, MD Referring Physician:   [name] is a 69 y.o. 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 as pt had recent cardioversion with ERAF. In his note he suggested use of flecainide and recent stress test was low risk on review of [name].   The pt does c/o of a lot of fatigue but has other issues as well contributing. He is rate controlled. He is on eliquis 5 mg bid for a CHA2DS2VASc Score of 2. Heis pending a bladder resection on 4/26. His last creatinine was 2.11.  Today, he denies symptoms of palpitations, chest pain, shortness of breath, orthopnea, PND, lower extremity edema, dizziness, presyncope, syncope, or neurologic sequela.++ fatigue. The patient is tolerating medications without difficulties and is otherwise without complaint today.   Past Medical History:  Diagnosis Date  . Arthritis   . 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 ([place]) 09/29/2016  . Bladder tumor   . Essential hypertension 06/23/2018  . Goals of care, counseling/discussion 09/30/2016  . History of prostate cancer followed by pcp dr [name]-  per pt last PSA undetectable   dx 2008-- (Stage T1c, Gleason 3+3,  PSA 4.58, vol 99cc)  s/p  radical prostatectomy (nerve sparing bilateral)   . Hyperglycemia 06/23/2018  . Hypertension   . Lower urinary tract symptoms (LUTS)   . Mild hyperlipidemia 06/23/2018  . Pre-diabetes   . Wears glasses    Past Surgical History:  Procedure Laterality Date  . CARDIOVERSION N/A 05/18/2019   Procedure: CARDIOVERSION;  Surgeon: Lelon Perla, MD;  Location: [facility];  Service: Cardiovascular;  Laterality: N/A;  . CATARACT EXTRACTION W/ INTRAOCULAR LENS  IMPLANT, BILATERAL Bilateral 2011  . [facility]  . IR FLUORO GUIDE PORT INSERTION RIGHT  10/07/2016  . IR RADIOLOGIST EVAL & MGMT  01/05/2018  . IR  US GUIDE VASC ACCESS RIGHT  10/07/2016  . KNEE ARTHROSCOPY Bilateral right 2006;  left 02-15-2007  . [facility];  1990;  1983  . ROBOT ASSISTED LAPAROSCOPIC RADICAL PROSTATECTOMY  06/20/2006   bilateral nerve sparing  . TEE WITHOUT CARDIOVERSION N/A 06/27/2018   Procedure: TRANSESOPHAGEAL ECHOCARDIOGRAM (TEE);  Surgeon: Nigel Mormon, MD;  Location: [facility] ENDOSCOPY;  Service: Cardiovascular;  Laterality: N/A;  . TOTAL HIP ARTHROPLASTY Left 03/03/2015   Procedure: LEFT TOTAL HIP ARTHROPLASTY ANTERIOR APPROACH;  Surgeon: Dorna Leitz, MD;  Location: [facility];  Service: Orthopedics;  Laterality: Left;  . TOTAL KNEE ARTHROPLASTY Bilateral left 08-13-2009;  right 12-26-2009  . TRANSURETHRAL RESECTION OF BLADDER TUMOR N/A 09/20/2016   Procedure: TRANSURETHRAL RESECTION OF BLADDER TUMOR (TURBT);  Surgeon: Franchot Gallo, MD;  Location: [facility];  Service: Urology;  Laterality: N/A;    Current Outpatient Medications  Medication Sig Dispense Refill  . amoxicillin (AMOXIL) 500 MG capsule SMARTSIG:4 Capsule(s) By Mouth Once    . apixaban (ELIQUIS) 5 MG TABS tablet Take 1 tablet (5 mg total) by mouth 2 (two) times daily. 60 tablet 6  . atorvastatin (LIPITOR) 40 MG tablet Take 40 mg by mouth every evening.     . Blood Glucose Monitoring Suppl ([facility]) w/Device KIT See admin instructions.    . ciprofloxacin (CILOXAN) 0.3 % ophthalmic solution Place 1 drop into the right eye 2 (two) times daily.    Marland Kitchen dexamethasone (DECADRON) 4 MG tablet Take 2 tablets (  8 mg total) by mouth daily. Start the day after chemotherapy for 2 days. Take with food. 30 tablet 1  . diltiazem (CARDIZEM CD) 360 MG 24 hr capsule TAKE 1 CAPSULE BY MOUTH EVERY DAY 90 capsule 0  . fluconazole (DIFLUCAN) 100 MG tablet Take 100 mg by mouth at bedtime.    . gabapentin (NEURONTIN) 400 MG capsule TAKE 1 CAPSULE (400 MG TOTAL) BY MOUTH 4 (FOUR) TIMES DAILY. (Patient taking differently: Take  400-800 mg by mouth See admin instructions. Taking 2 capsules by mouth in the am and 2 capsules in the pm) 360 capsule 2  . LORazepam (ATIVAN) 0.5 MG tablet Take 1 tablet (0.5 mg total) by mouth every 6 (six) hours as needed (Nausea or vomiting). 30 tablet 0  . magnesium oxide (MAG-OX) 400 MG tablet Take 400 mg by mouth daily.     . metoprolol succinate (TOPROL-XL) 25 MG 24 hr tablet Take 25 mg by mouth at bedtime.    . ondansetron (ZOFRAN) 8 MG tablet Take 1 tablet (8 mg total) by mouth 2 (two) times daily as needed for refractory nausea / vomiting. Start 2 days after chemotherapy. 30 tablet 1  . OneTouch Delica Lancets 17C MISC 2 (two) times daily. as directed    . ONETOUCH VERIO test strip 2 (two) times daily. for testing    . Potassium Chloride ER 20 MEQ TBCR Take 20 mEq by mouth daily with breakfast.     . prednisoLONE acetate (PRED FORTE) 1 % ophthalmic suspension     . prochlorperazine (COMPAZINE) 10 MG tablet Take 1 tablet (10 mg total) by mouth every 6 (six) hours as needed (Nausea or vomiting). 30 tablet 1  . pyridoxine (B-6) 100 MG tablet Take 200 mg by mouth daily with breakfast.     . traMADol (ULTRAM) 50 MG tablet Take 50 mg by mouth daily as needed (pain.).     Marland Kitchen VITAMIN D PO Take 1 tablet by mouth daily.    . Vitamin D, Ergocalciferol, (DRISDOL) 1.25 MG (50000 UT) CAPS capsule Take 50,000 Units by mouth every Saturday.      No current facility-administered medications for this encounter.   Facility-Administered Medications Ordered in Other Encounters  Medication Dose Route Frequency Provider Last Rate Last Admin  . sodium chloride flush (NS) 0.9 % injection 10 mL  10 mL Intravenous PRN [place], Holli Humbles, NP   10 mL at 02/21/17 1038  . sodium chloride flush (NS) 0.9 % injection 10 mL  10 mL Intravenous PRN Volanda Napoleon, MD   10 mL at 07/10/18 0844    No Known Allergies  Social History   Socioeconomic History  . Marital status: Married    Spouse name: Not on file  .  Number of children: Not on file  . Years of education: Not on file  . Highest education level: Not on file  Occupational History  . Not on file  Tobacco Use  . Smoking status: Former Smoker    Years: 16.00    Types: Cigarettes    Quit date: 09/25/1984    Years since quitting: 34.8  . Smokeless tobacco: Never Used  Substance and Sexual Activity  . Alcohol use: Yes    Comment: occasionally  . Drug use: No  . Sexual activity: Not on file  Other Topics Concern  . Not on file  Social History Narrative  . Not on file   Social Determinants of Health   Financial Resource Strain:   . Difficulty of  Paying Living Expenses:   Food Insecurity:   . Worried About Charity fundraiser in the Last Year:   . Arboriculturist in the Last Year:   Transportation Needs:   . Film/video editor (Medical):   Marland Kitchen Lack of Transportation (Non-Medical):   Physical Activity:   . Days of Exercise per Week:   . Minutes of Exercise per Session:   Stress:   . Feeling of Stress :   Social Connections:   . Frequency of Communication with Friends and Family:   . Frequency of Social Gatherings with Friends and Family:   . Attends Religious Services:   . Active Member of Clubs or Organizations:   . Attends Archivist Meetings:   Marland Kitchen Marital Status:   Intimate Partner Violence:   . Fear of Current or Ex-Partner:   . Emotionally Abused:   Marland Kitchen Physically Abused:   . Sexually Abused:     Family History  Problem Relation Age of Onset  . Hypertension Mother   . Aneurysm Mother   . Emphysema Father   . Hypertension Father     ROS- All systems are reviewed and negative except as per the HPI above  Physical Exam: Vitals:   07/13/19 1115  Pulse: 94  Weight: 130.5 kg  Height: '6\' 2"'  (1.88 m)   Wt Readings from Last 3 Encounters:  07/13/19 130.5 kg  07/09/19 127 kg  06/27/19 126.1 kg    Labs: Lab Results  Component Value Date   NA 138 07/09/2019   K 4.1 07/09/2019   CL 102 07/09/2019     CO2 29 07/09/2019   GLUCOSE 225 (H) 07/09/2019   BUN 31 (H) 07/09/2019   CREATININE 2.11 (H) 07/09/2019   CALCIUM 9.0 07/09/2019   PHOS 3.2 10/14/2018   MG 1.9 12/29/2018   Lab Results  Component Value Date   INR 1.1 09/25/2018   Lab Results  Component Value Date   CHOL 159 10/05/2018   HDL 47 10/05/2018   LDLCALC 86 10/05/2018   TRIG 131 10/05/2018     GEN- The patient is well appearing, alert and oriented x 3 today.   Head- normocephalic, atraumatic Eyes-  Sclera clear, conjunctiva pink Ears- hearing intact Oropharynx- clear Neck- supple, no JVP Lymph- no cervical lymphadenopathy Lungs- Clear to ausculation bilaterally, normal work of breathing Heart- Regular rate and rhythm, no murmurs, rubs or gallops, PMI not laterally displaced GI- soft, NT, ND, + BS Extremities- no clubbing, cyanosis, or edema MS- no significant deformity or atrophy Skin- no rash or lesion Psych- euthymic mood, full affect Neuro- strength and sensation are intact  EKG-afib at 94 bpm, qrs int 92 ms, qtc 440 ms Epic records reviewed  Echo-1. The left ventricle has normal systolic function, with an ejection  fraction of 55-60%. The cavity size was normal. Left ventricular diastolic  Doppler parameters are consistent with impaired relaxation.  2. The right ventricle has normal systolic function. The cavity was  mildly enlarged. There is no increase in right ventricular wall thickness.  3. Right atrial size was mildly dilated.  4. Moderate thickening of the aortic valve Mild calcification of the  aortic valve.  5. There is dilatation of the aortic root measuring 39 mm.  6. No vegetations detected.   Assessment and Plan: 1. Afib Recent successful cardioversion but ERAF Dr, Debara Pickett was contemplating use of flecainide however, this drug interacts with 4 of his current drugs, fluconazole, ondansetron, prochlorperazine, and myrbetriq Also, he is  pending bladder surgery 4/26. If cardioversion  was to be done in the next 2 weeks,  this would delay his surgery as he would not be able to stop his anticoagulation After discussion with pt and wife, I will schedule to see him back the end of May to further discuss potential antiarrythmic/cardioversion. He will have been back on anticogulation, hopefully, for 3 weeks after surgery   2.HTN Stable  3. CHA2DS2VASc score of 2 Continue eliquis 5 mg bid   Will see back 5/24  Butch Penny C. Bowen Goyal, [facility] [address] [place], [place] [address] [phone]

## 2019-07-19 DIAGNOSIS — Z7409 Other reduced mobility: Secondary | ICD-10-CM | POA: Diagnosis not present

## 2019-07-19 DIAGNOSIS — G629 Polyneuropathy, unspecified: Secondary | ICD-10-CM | POA: Diagnosis not present

## 2019-07-19 DIAGNOSIS — R531 Weakness: Secondary | ICD-10-CM | POA: Diagnosis not present

## 2019-07-19 DIAGNOSIS — R2689 Other abnormalities of gait and mobility: Secondary | ICD-10-CM | POA: Diagnosis not present

## 2019-07-19 DIAGNOSIS — R5381 Other malaise: Secondary | ICD-10-CM | POA: Diagnosis not present

## 2019-07-20 ENCOUNTER — Telehealth: Payer: Self-pay | Admitting: Internal Medicine

## 2019-07-20 NOTE — Telephone Encounter (Signed)
Pt calling today with c/o SOB, increased HR with activity. He states he has been unable to do any increased activity because of his high HR's. Yesterday his HR was in the 160s while on his bike. He also notices he becomes very winded when walking to and from his mailbox. Normally he tolerates this well. His BP's have been averaging in the 130's/80s.   Advised him I would forward his concern to the AF clinic as he may be able to be worked in today or early next week for recommendation. He understands to seek emergency health with HR's sustained > 140, especially if he is symptomatic.   He agrees with plan and had no additional questions.

## 2019-07-20 NOTE — Telephone Encounter (Signed)
Discussed with Adline Peals PA recommends increasing metoprolol to $RemoveBefor'50mg'[id number]$  once a day and call on Monday with update of HRs. If HRs continued to be elevated recommends office visit next week for further rate control. Pt in agreement.

## 2019-07-20 NOTE — Telephone Encounter (Signed)
New Message     .Patient c/o Palpitations:  High priority if patient c/o lightheadedness, shortness of breath, or chest pain  1) How long have you had palpitations/irregular HR/ Afib? Are you having the symptoms now?   No at the moment   2) Are you currently experiencing lightheadedness, SOB or CP? SOB doing any activity, but does go normal after sitting still   3) Do you have a history of afib (atrial fibrillation) or irregular heart rhythm? Yes    4) Have you checked your BP or HR?  (document readings if available):    Yesterday HR 162 (after riding bicycle)  HR 120's after walking to mailbox    Oxygen goes below 90   4) Are you experiencing any other symptoms? Physical therapy and he has to stop because they monitor his HR and it goes up. Also walking to the mailbox he gets short winded and get a high hr

## 2019-07-23 MED ORDER — METOPROLOL SUCCINATE ER 25 MG PO TB24: ORAL_TABLET | ORAL | Status: DC

## 2019-07-23 NOTE — Telephone Encounter (Signed)
Wife calling back with update since increasing metoprolol. HR still 120-130s with any exertion 87 at rest. BP still controlled. Discussed with Adline Peals PA will increase metoprolol to $RemoveBefor'50mg'PrEZtdjecfgo$  in the morning and $RemoveBef'25mg'PdczreTHEA$  in the evening. Call with update on Thursday.

## 2019-07-23 NOTE — Progress Notes (Signed)
Pharmacist Chemotherapy Monitoring - Follow Up Assessment    I verify that I have reviewed each item in the below checklist:  . Regimen for the patient is scheduled for the appropriate day and plan matches scheduled date. Marland Kitchen Appropriate non-routine labs are ordered dependent on drug ordered. . If applicable, additional medications reviewed and ordered per protocol based on lifetime cumulative doses and/or treatment regimen.   Plan for follow-up and/or issues identified: No . I-vent associated with next due treatment: No . MD and/or nursing notified: No  Bailey Faiella, [name] 07/23/2019 8:45 AM

## 2019-07-27 ENCOUNTER — Encounter (HOSPITAL_BASED_OUTPATIENT_CLINIC_OR_DEPARTMENT_OTHER): Payer: Self-pay | Admitting: Urology

## 2019-07-27 ENCOUNTER — Other Ambulatory Visit: Payer: Self-pay

## 2019-07-27 MED ORDER — METOPROLOL SUCCINATE ER 25 MG PO TB24: 50.0000 mg | ORAL_TABLET | Freq: Two times a day (BID) | ORAL | Status: DC

## 2019-07-27 NOTE — Progress Notes (Addendum)
Addendum: spoke with jessica zanetto p ok to proceed  Spoke w/ via phone for pre-op interview---melanie wife and patient Lab needs dos----  I stat 8            COVID test ------08-02-2019 1000 Arrive at ------530 08-06-2019- NPO after ------midnight Medications to take morning of surgery -----gabapentin, metorpolol succinate, diltiazem, merbetriq Diabetic medication -----n/a Patient Special Instructions -----none Pre-Op special Istructions -----none Patient verbalized understanding of instructions that were given at this phone interview. Patient denies shortness of breath, chest pain, fever, cough a this phone interview.  Anesthesia : ckd, afib  PCP:dr Shirline Frees Cardiologist :dr Debara Pickett, lov donna acrroll no 07-13-2019 Chest x-ray :09-24-2018 epic EKG : 07-13-2019 epic Echo : tee 06-27-2018 epic, echo 06-22-2018 epic Cardiac Cath : none Requested lov note nephrology dr Cecille Rubin foster May 16, 2019 Sleep Study/ CPAP :n/a Fasting Blood Sugar :      / Checks Blood Sugar -- times a day:  n/a Blood Thinner/ Instructions /Last Dose: eliquis, patient instructed by dr Debara Pickett to stop eliquis 2 days before surgery ASA / Instructions/ Last Dose : n/a  Patient denies shortness of breath, chest pain, fever, and cough at this phone interview.

## 2019-07-27 NOTE — Telephone Encounter (Signed)
Patient HR in the mid-90s at rest with exertion it does go up but recovers better than before. Discussed with Roderic Palau NP will increase metoprolol to $RemoveBefor'50mg'otszeGdozZkv$  twice a day - call on Wednesday with update.

## 2019-07-30 ENCOUNTER — Inpatient Hospital Stay (HOSPITAL_BASED_OUTPATIENT_CLINIC_OR_DEPARTMENT_OTHER): Payer: PPO | Admitting: Family

## 2019-07-30 ENCOUNTER — Inpatient Hospital Stay: Payer: PPO | Attending: Hematology & Oncology

## 2019-07-30 ENCOUNTER — Encounter: Payer: Self-pay | Admitting: Family

## 2019-07-30 ENCOUNTER — Other Ambulatory Visit: Payer: Self-pay

## 2019-07-30 ENCOUNTER — Inpatient Hospital Stay: Payer: PPO

## 2019-07-30 VITALS — BP 133/75 | HR 88 | Temp 96.9°F | Resp 22 | Ht 74.0 in | Wt 279.0 lb

## 2019-07-30 DIAGNOSIS — C671 Malignant neoplasm of dome of bladder: Secondary | ICD-10-CM

## 2019-07-30 DIAGNOSIS — D649 Anemia, unspecified: Secondary | ICD-10-CM | POA: Diagnosis not present

## 2019-07-30 DIAGNOSIS — C679 Malignant neoplasm of bladder, unspecified: Secondary | ICD-10-CM

## 2019-07-30 DIAGNOSIS — Z5111 Encounter for antineoplastic chemotherapy: Secondary | ICD-10-CM | POA: Insufficient documentation

## 2019-07-30 DIAGNOSIS — C772 Secondary and unspecified malignant neoplasm of intra-abdominal lymph nodes: Secondary | ICD-10-CM

## 2019-07-30 DIAGNOSIS — R3915 Urgency of urination: Secondary | ICD-10-CM | POA: Insufficient documentation

## 2019-07-30 DIAGNOSIS — D508 Other iron deficiency anemias: Secondary | ICD-10-CM | POA: Diagnosis not present

## 2019-07-30 DIAGNOSIS — M6281 Muscle weakness (generalized): Secondary | ICD-10-CM | POA: Diagnosis not present

## 2019-07-30 DIAGNOSIS — M21372 Foot drop, left foot: Secondary | ICD-10-CM | POA: Insufficient documentation

## 2019-07-30 DIAGNOSIS — G629 Polyneuropathy, unspecified: Secondary | ICD-10-CM | POA: Insufficient documentation

## 2019-07-30 DIAGNOSIS — Z5189 Encounter for other specified aftercare: Secondary | ICD-10-CM | POA: Insufficient documentation

## 2019-07-30 DIAGNOSIS — K5780 Diverticulitis of intestine, part unspecified, with perforation and abscess without bleeding: Secondary | ICD-10-CM | POA: Diagnosis not present

## 2019-07-30 DIAGNOSIS — R350 Frequency of micturition: Secondary | ICD-10-CM | POA: Insufficient documentation

## 2019-07-30 DIAGNOSIS — D500 Iron deficiency anemia secondary to blood loss (chronic): Secondary | ICD-10-CM

## 2019-07-30 DIAGNOSIS — Z79899 Other long term (current) drug therapy: Secondary | ICD-10-CM | POA: Diagnosis not present

## 2019-07-30 DIAGNOSIS — I4891 Unspecified atrial fibrillation: Secondary | ICD-10-CM | POA: Insufficient documentation

## 2019-07-30 LAB — CMP (CANCER CENTER ONLY)
ALT: 20 U/L (ref 0–44)
AST: 24 U/L (ref 15–41)
Albumin: 3.8 g/dL (ref 3.5–5.0)
Alkaline Phosphatase: 87 U/L (ref 38–126)
Anion gap: 12 (ref 5–15)
BUN: 28 mg/dL — ABNORMAL HIGH (ref 8–23)
CO2: 25 mmol/L (ref 22–32)
Calcium: 8.9 mg/dL (ref 8.9–10.3)
Chloride: 101 mmol/L (ref 98–111)
Creatinine: 2.14 mg/dL — ABNORMAL HIGH (ref 0.61–1.24)
GFR, Est AFR Am: 36 mL/min — ABNORMAL LOW (ref >60)
GFR, Estimated: 31 mL/min — ABNORMAL LOW (ref >60)
Glucose, Bld: 199 mg/dL — ABNORMAL HIGH (ref 70–99)
Potassium: 4.2 mmol/L (ref 3.5–5.1)
Sodium: 138 mmol/L (ref 135–145)
Total Bilirubin: 0.7 mg/dL (ref 0.3–1.2)
Total Protein: 6 g/dL — ABNORMAL LOW (ref 6.5–8.1)

## 2019-07-30 LAB — RETICULOCYTES
Immature Retic Fract: 33.6 % — ABNORMAL HIGH (ref 2.3–15.9)
RBC.: 2.47 MIL/uL — ABNORMAL LOW (ref 4.22–5.81)
Retic Count, Absolute: 130.2 10*3/uL (ref 19.0–186.0)
Retic Ct Pct: 5.3 % — ABNORMAL HIGH (ref 0.4–3.1)

## 2019-07-30 LAB — CBC WITH DIFFERENTIAL (CANCER CENTER ONLY)
Abs Immature Granulocytes: 0.2 10*3/uL — ABNORMAL HIGH (ref 0.00–0.07)
Basophils Absolute: 0.1 10*3/uL (ref 0.0–0.1)
Basophils Relative: 1 %
Eosinophils Absolute: 0.1 10*3/uL (ref 0.0–0.5)
Eosinophils Relative: 1 %
HCT: 26.3 % — ABNORMAL LOW (ref 39.0–52.0)
Hemoglobin: 8.3 g/dL — ABNORMAL LOW (ref 13.0–17.0)
Immature Granulocytes: 3 %
Lymphocytes Relative: 13 %
Lymphs Abs: 1.1 10*3/uL (ref 0.7–4.0)
MCH: 33.6 pg (ref 26.0–34.0)
MCHC: 31.6 g/dL (ref 30.0–36.0)
MCV: 106.5 fL — ABNORMAL HIGH (ref 80.0–100.0)
Monocytes Absolute: 1.5 10*3/uL — ABNORMAL HIGH (ref 0.1–1.0)
Monocytes Relative: 18 %
Neutro Abs: 5.1 10*3/uL (ref 1.7–7.7)
Neutrophils Relative %: 64 %
Platelet Count: 180 10*3/uL (ref 150–400)
RBC: 2.47 MIL/uL — ABNORMAL LOW (ref 4.22–5.81)
RDW: 17.2 % — ABNORMAL HIGH (ref 11.5–15.5)
WBC Count: 8 10*3/uL (ref 4.0–10.5)
nRBC: 0 % (ref 0.0–0.2)

## 2019-07-30 LAB — IRON AND TIBC
Iron: 62 ug/dL (ref 42–163)
Saturation Ratios: 26 % (ref 20–55)
TIBC: 240 ug/dL (ref 202–409)
UIBC: 178 ug/dL (ref 117–376)

## 2019-07-30 LAB — SAMPLE TO BLOOD BANK

## 2019-07-30 LAB — FERRITIN: Ferritin: 914 ng/mL — ABNORMAL HIGH (ref 24–336)

## 2019-07-30 LAB — PREPARE RBC (CROSSMATCH)

## 2019-07-30 MED ORDER — SODIUM CHLORIDE 0.9 % IV SOLN
400.0000 mg/m2 | Freq: Once | INTRAVENOUS | Status: AC
  Administered 2019-07-30: 1000 mg via INTRAVENOUS
  Filled 2019-07-30: qty 10

## 2019-07-30 MED ORDER — SODIUM CHLORIDE 0.9 % IV SOLN
500.0000 mg/m2 | Freq: Once | INTRAVENOUS | Status: AC
  Administered 2019-07-30: 1254 mg via INTRAVENOUS
  Filled 2019-07-30: qty 26.3

## 2019-07-30 MED ORDER — HEPARIN SOD (PORK) LOCK FLUSH 100 UNIT/ML IV SOLN
500.0000 [IU] | Freq: Once | INTRAVENOUS | Status: AC | PRN
  Administered 2019-07-30: 500 [IU]
  Filled 2019-07-30: qty 5

## 2019-07-30 MED ORDER — SODIUM CHLORIDE 0.9% FLUSH
10.0000 mL | INTRAVENOUS | Status: DC | PRN
  Administered 2019-07-30: 10 mL
  Filled 2019-07-30: qty 10

## 2019-07-30 MED ORDER — SODIUM CHLORIDE 0.9 % IV SOLN
Freq: Once | INTRAVENOUS | Status: AC
  Filled 2019-07-30: qty 60

## 2019-07-30 MED ORDER — SODIUM CHLORIDE 0.9 % IV SOLN
10.0000 mg | Freq: Once | INTRAVENOUS | Status: AC
  Administered 2019-07-30: 10 mg via INTRAVENOUS
  Filled 2019-07-30: qty 10

## 2019-07-30 MED ORDER — SODIUM CHLORIDE 0.9 % IV SOLN
INTRAVENOUS | Status: DC
  Filled 2019-07-30: qty 250

## 2019-07-30 MED ORDER — SODIUM CHLORIDE 0.9 % IV SOLN
Freq: Once | INTRAVENOUS | Status: AC
  Filled 2019-07-30: qty 250

## 2019-07-30 MED ORDER — PALONOSETRON HCL INJECTION 0.25 MG/5ML
0.2500 mg | Freq: Once | INTRAVENOUS | Status: AC
  Administered 2019-07-30: 0.25 mg via INTRAVENOUS

## 2019-07-30 MED ORDER — PALONOSETRON HCL INJECTION 0.25 MG/5ML
INTRAVENOUS | Status: AC
  Filled 2019-07-30: qty 5

## 2019-07-30 NOTE — Progress Notes (Signed)
Reviewed pt labs with [name] and pt ok to treat with 07/30/2019 lab values.

## 2019-07-30 NOTE — Progress Notes (Signed)
Hematology and Oncology Follow Up Visit  [name] Rose [id number] Sep 22, 1950 69 y.o. 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 Taxotere 80mg /m2 IV q 3 wks - s/p cycle #3 - d/c due to progression M-VAC - s/p cycle#8 -- d/c on 07/10/2018 due to blood counts Padcev -- s/p cycle #1 on 07/17/2018 d/c 08/28/2018  Current Therapy: Ifosfamide/Gemzar -- s/p cycle 5- started 04/16/2019 IV iron as indicated    Interim History:  [name] Rose is here today for follow-up and treatment. He is feeling fatigued and has noted SOB with any exertion.  He is in controlled a-fib and states that his cardiologist [name] plans to do a cardioversion in a few weeks.  He is scheduled for surgery with Dr. Diona Fanti next week to remove tumors in the bladder. He has been symptomatic with urinary urgency and frequency. He also notes a little blood in his urine after each treatment.  No other blood loss has been noted. No bruising or petechiae.  His PT is on hold for now due to the fatigue and SOB.  Hg  Is 8.3, platelets 190, WBC count 8.0.   He has puffiness in the feet and ankles that seems to be a bit improved. Pedal pulses are 2+.  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 He has left foot drop.  No falls or syncopal episodes to report.  He has maintained a good appetite and states that he is hydrating well. His weight is stable.   ECOG Performance Status: 1 - Symptomatic but completely ambulatory  Medications:  Allergies as of 07/30/2019   No Known Allergies     Medication List       Accurate as of July 30, 2019  8:43 AM. If you have any questions, ask your nurse or doctor.        apixaban 5 MG Tabs tablet Commonly known as: Eliquis Take 1 tablet (5 mg total) by mouth 2 (two) times daily.     atorvastatin 40 MG tablet Commonly known as: LIPITOR Take 40 mg by mouth every evening.   dexamethasone 4 MG tablet Commonly known as: DECADRON Take 2 tablets (8 mg total) by mouth daily. Start the day after chemotherapy for 2 days. Take with food.   diltiazem 360 MG 24 hr capsule Commonly known as: CARDIZEM CD TAKE 1 CAPSULE BY MOUTH EVERY DAY   gabapentin 400 MG capsule Commonly known as: NEURONTIN TAKE 1 CAPSULE (400 MG TOTAL) BY MOUTH 4 (FOUR) TIMES DAILY. What changed:   how much to take  when to take this  additional instructions   LORazepam 0.5 MG tablet Commonly known as: Ativan Take 1 tablet (0.5 mg total) by mouth every 6 (six) hours as needed (Nausea or vomiting).   magnesium oxide 400 MG tablet Commonly known as: MAG-OX Take 400 mg by mouth daily.   metoprolol succinate 25 MG 24 hr tablet Commonly known as: TOPROL-XL Take 2 tablets (50 mg total) by mouth in the morning and at bedtime.   Myrbetriq 50 MG Tb24 tablet Generic drug: mirabegron ER Take 50 mg by mouth daily.   ondansetron 8 MG tablet Commonly known as: Zofran Take 1 tablet (8 mg total) by mouth 2 (two) times daily as needed for refractory nausea / vomiting. Start 2 days after chemotherapy.   Potassium Chloride ER 20 MEQ  Tbcr Take 20 mEq by mouth daily with breakfast.   prochlorperazine 10 MG tablet Commonly known as: COMPAZINE Take 1 tablet (10 mg total) by mouth every 6 (six) hours as needed (Nausea or vomiting).   pyridoxine 100 MG tablet Commonly known as: B-6 Take 200 mg by mouth daily with breakfast.   traMADol 50 MG tablet Commonly known as: ULTRAM Take 50 mg by mouth daily as needed (pain.).   VITAMIN D PO Take 1 tablet by mouth daily.       Allergies: No Known Allergies  Past Medical History, Surgical history, Social history, and Family History were reviewed and updated.  Review of Systems: All other 10 point review of systems is negative.   Physical Exam:  vitals  were not taken for this visit.   Wt Readings from Last 3 Encounters:  07/13/19 287 lb 12.8 oz (130.5 kg)  07/09/19 280 lb (127 kg)  06/27/19 278 lb (126.1 kg)    Ocular: Sclerae unicteric, pupils equal, round and reactive to light Ear-nose-throat: Oropharynx clear, dentition fair Lymphatic: No cervical or supraclavicular adenopathy Lungs no rales or rhonchi, good excursion bilaterally Heart irregular, controlled rate and rhythm (atrial fib), no murmur appreciated Abd soft, nontender, positive bowel sounds MSK no focal spinal tenderness, no joint edema Neuro: non-focal, well-oriented, appropriate affect Breasts:   Lab Results  Component Value Date   WBC 5.2 07/09/2019   HGB 8.8 (L) 07/09/2019   HCT 27.4 (L) 07/09/2019   MCV 103.0 (H) 07/09/2019   PLT 148 (L) 07/09/2019   Lab Results  Component Value Date   FERRITIN 1,141 (H) 07/09/2019   IRON 64 07/09/2019   TIBC 251 07/09/2019   UIBC 187 07/09/2019   IRONPCTSAT 26 07/09/2019   Lab Results  Component Value Date   RETICCTPCT 4.0 (H) 07/09/2019   RBC 2.68 (L) 07/09/2019   No results found for: KPAFRELGTCHN, LAMBDASER, KAPLAMBRATIO No results found for: Kandis Cocking, IGMSERUM No results found for: [name], SPEI   Chemistry      Component Value Date/Time   NA 138 07/09/2019 0955   NA 140 05/15/2019 1109   NA 141 02/21/2017 0902   K 4.1 07/09/2019 0955   K 4.1 02/21/2017 0902   CL 102 07/09/2019 0955   CL 98 02/21/2017 0902   CO2 29 07/09/2019 0955   CO2 31 02/21/2017 0902   BUN 31 (H) 07/09/2019 0955   BUN 36 (H) 05/15/2019 1109   BUN 17 02/21/2017 0902   CREATININE 2.11 (H) 07/09/2019 0955   CREATININE 1.2 02/21/2017 0902      Component Value Date/Time   CALCIUM 9.0 07/09/2019 0955   CALCIUM 9.2 02/21/2017 0902   ALKPHOS 100 07/09/2019 0955   ALKPHOS 65 02/21/2017 0902   AST 20 07/09/2019 0955   ALT 15 07/09/2019 0955   ALT 18 02/21/2017 0902    BILITOT 0.6 07/09/2019 0955       Impression and Plan: [name] Rose is a very pleasant 69 yo caucasian gentleman with metastatic high grade bladder cancer.  We will proceed with cycle 6 today as planned and repeat PET scan in 3 weeks.  He will get 2 units of blood this week as well to correct anemia prior to surgery.  We will plan to see him again 3 weeks.  He will contact our office with any questions or concerns. We can certainly see him sooner if needed.   Laverna Peace, NP 4/19/20218:43 AM

## 2019-07-30 NOTE — Patient Instructions (Signed)
[facility] Discharge Instructions for Patients Receiving Chemotherapy  Today you received the following chemotherapy agents: Ifosphamide, Gemzar and Mesna  To help prevent nausea and vomiting after your treatment, we encourage you to take your nausea medication as prescribed by MD.   If you develop nausea and vomiting that is not controlled by your nausea medication, call the clinic.   BELOW ARE SYMPTOMS THAT SHOULD BE REPORTED IMMEDIATELY:  *FEVER GREATER THAN 100.5 F  *CHILLS WITH OR WITHOUT FEVER  NAUSEA AND VOMITING THAT IS NOT CONTROLLED WITH YOUR NAUSEA MEDICATION  *UNUSUAL SHORTNESS OF BREATH  *UNUSUAL BRUISING OR BLEEDING  TENDERNESS IN MOUTH AND THROAT WITH OR WITHOUT PRESENCE OF ULCERS  *URINARY PROBLEMS  *BOWEL PROBLEMS  UNUSUAL RASH Items with * indicate a potential emergency and should be followed up as soon as possible.  Feel free to call the clinic should you have any questions or concerns. The clinic phone number is [phone].  Please show the [place] [facility] at check-in to the Emergency Department and triage nurse.

## 2019-07-31 ENCOUNTER — Inpatient Hospital Stay: Payer: PPO

## 2019-07-31 VITALS — BP 112/72 | HR 66 | Temp 98.0°F | Resp 18

## 2019-07-31 DIAGNOSIS — D649 Anemia, unspecified: Secondary | ICD-10-CM

## 2019-07-31 DIAGNOSIS — Z5111 Encounter for antineoplastic chemotherapy: Secondary | ICD-10-CM | POA: Diagnosis not present

## 2019-07-31 DIAGNOSIS — C679 Malignant neoplasm of bladder, unspecified: Secondary | ICD-10-CM

## 2019-07-31 DIAGNOSIS — C772 Secondary and unspecified malignant neoplasm of intra-abdominal lymph nodes: Secondary | ICD-10-CM

## 2019-07-31 MED ORDER — SODIUM CHLORIDE 0.9 % IV SOLN
400.0000 mg/m2 | Freq: Once | INTRAVENOUS | Status: AC
  Administered 2019-07-31: 1000 mg via INTRAVENOUS
  Filled 2019-07-31: qty 10

## 2019-07-31 MED ORDER — DIPHENHYDRAMINE HCL 25 MG PO CAPS
25.0000 mg | ORAL_CAPSULE | Freq: Once | ORAL | Status: AC
  Administered 2019-07-31: 25 mg via ORAL

## 2019-07-31 MED ORDER — SODIUM CHLORIDE 0.9 % IV SOLN
150.0000 mg | Freq: Once | INTRAVENOUS | Status: AC
  Administered 2019-07-31: 150 mg via INTRAVENOUS
  Filled 2019-07-31: qty 150

## 2019-07-31 MED ORDER — ACETAMINOPHEN 325 MG PO TABS
ORAL_TABLET | ORAL | Status: AC
  Filled 2019-07-31: qty 2

## 2019-07-31 MED ORDER — SODIUM CHLORIDE 0.9 % IV SOLN
10.0000 mg | Freq: Once | INTRAVENOUS | Status: AC
  Administered 2019-07-31: 10 mg via INTRAVENOUS
  Filled 2019-07-31: qty 10

## 2019-07-31 MED ORDER — SODIUM CHLORIDE 0.9 % IV SOLN
Freq: Once | INTRAVENOUS | Status: AC
  Filled 2019-07-31: qty 250

## 2019-07-31 MED ORDER — ACETAMINOPHEN 325 MG PO TABS
650.0000 mg | ORAL_TABLET | Freq: Once | ORAL | Status: AC
  Administered 2019-07-31: 650 mg via ORAL

## 2019-07-31 MED ORDER — SODIUM CHLORIDE 0.9% FLUSH
10.0000 mL | INTRAVENOUS | Status: DC | PRN
  Administered 2019-07-31: 10 mL
  Filled 2019-07-31: qty 10

## 2019-07-31 MED ORDER — DIPHENHYDRAMINE HCL 25 MG PO CAPS
ORAL_CAPSULE | ORAL | Status: AC
  Filled 2019-07-31: qty 1

## 2019-07-31 MED ORDER — HEPARIN SOD (PORK) LOCK FLUSH 100 UNIT/ML IV SOLN
500.0000 [IU] | Freq: Once | INTRAVENOUS | Status: AC | PRN
  Administered 2019-07-31: 500 [IU]
  Filled 2019-07-31: qty 5

## 2019-07-31 MED ORDER — SODIUM CHLORIDE 0.9 % IV SOLN
Freq: Once | INTRAVENOUS | Status: AC
  Filled 2019-07-31: qty 60

## 2019-07-31 NOTE — Patient Instructions (Signed)
Ifosfamide injection What is this medicine? IFOSFAMIDE (eye FOS fa mide) is a chemotherapy drug. It slows the growth of cancer cells. This medicine is used to treat testicular cancer and other cancers. This medicine may be used for other purposes; ask your health care provider or pharmacist if you have questions. COMMON BRAND NAME(S): Ifex, Ifex/Mesna What should I tell my health care provider before I take this medicine? They need to know if you have any of these conditions:  heart disease  history of irregular heartbeat  immune system problems  infection (especially a virus infection such as chickenpox, cold sores, or herpes)  infections in the bladder, kidneys, or urinary tract  kidney disease  low blood counts, like low white cell, platelet, or red cell counts  lung or breathing disease, like asthma  problems urinating  recent or ongoing radiation therapy  tingling of the fingers or toes, or other nerve disorder  an unusual or allergic reaction to ifosfamide, other medicines, foods, dyes, or preservatives  pregnant or trying to get pregnant  breast-feeding How should I use this medicine? This drug is given as an infusion into a vein. It is administered in a hospital or clinic by a specially trained health care professional. Talk to your pediatrician regarding the use of this medicine in children. Special care may be needed. Overdosage: If you think you have taken too much of this medicine contact a poison control center or emergency room at once. NOTE: This medicine is only for you. Do not share this medicine with others. What if I miss a dose? It is important not to miss your dose. Call your doctor or health care professional if you are unable to keep an appointment. What may interact with this medicine? This medicine may interact with the following medications:  aprepitant, fosaprepitant  certain medicines for fungal infections like fluconazole, itraconazole, and  ketoconazole  certain medicines for seizures like carbamazepine, phenobarbital, phenytoin  grapefruit, grapefruit juice  rifampin  [place] Wort This list may not describe all possible interactions. Give your health care provider a list of all the medicines, herbs, non-prescription drugs, or dietary supplements you use. Also tell them if you smoke, drink alcohol, or use illegal drugs. Some items may interact with your medicine. What should I watch for while using this medicine? Visit your doctor for checks on your progress. This drug may make you feel generally unwell. This is not uncommon, as chemotherapy can affect healthy cells as well as cancer cells. Report any side effects. Continue your course of treatment even though you feel ill unless your doctor tells you to stop. Drink water or other fluids as directed. Urinate often, even at night. In some cases, you may be given additional medicines to help with side effects. Follow all directions for their use. Call your doctor or health care professional for advice if you get a fever, chills or sore throat, or other symptoms of a cold or flu. Do not treat yourself. This drug decreases your body's ability to fight infections. Try to avoid being around people who are sick. This medicine may increase your risk to bruise or bleed. Call your doctor or health care professional if you notice any unusual bleeding. Be careful brushing and flossing your teeth or using a toothpick because you may get an infection or bleed more easily. If you have any dental work done, tell your dentist you are receiving this medicine. Avoid taking products that contain aspirin, acetaminophen, ibuprofen, naproxen, or ketoprofen unless  instructed by your doctor. These medicines may hide a fever. Do not become pregnant while taking this medicine. Women should inform their doctor if they wish to become pregnant or think they might be pregnant. Men should not father a child while  taking this medicine and for 6 months after stopping it. There is a potential for serious side effects to an unborn child. Talk to your health care professional or pharmacist for more information. Do not breast-feed an infant while taking this medicine. This medicine has caused ovarian failure in some women. This medicine may interfere with the ability to have a child. Talk with your doctor or health care professional if you are concerned about your fertility. This medicine has caused reduced sperm counts in some men. This may interfere with the ability to father a child. You should talk to your doctor or health care professional if you are concerned about your fertility. What side effects may I notice from receiving this medicine? Side effects that you should report to your doctor or health care professional as soon as possible:  allergic reactions like skin rash, itching or hives, swelling of the face, lips, or tongue  breathing problems  chest pain or palpitations  confusion  dizziness  hallucination, loss of contact with reality  loss of balance or coordination  nausea, vomiting  pain, tingling, numbness in the hands or feet  seizures  signs of decreased platelets or bleeding - bruising, pinpoint red spots on the skin, black, tarry stools, blood in the urine  signs of decreased red blood cells - unusually weak or tired, feeling faint or lightheaded, falls  signs and symptoms of infection like fever or chills; cough; sore throat; pain or trouble passing urine  signs and symptoms of kidney injury like trouble passing urine or change in the amount of urine  signs and symptoms of liver injury like dark yellow or brown urine; general ill feeling or flu-like symptoms; light-colored stools; loss of appetite; nausea; right upper belly pain; unusually weak or tired; yellowing of the eyes or skin  swelling of the legs or ankles  tremors Side effects that usually do not require  medical attention (report to your doctor or health care professional if they continue or are bothersome):  changes in vision  constipation  decreased hearing  diarrhea  hair loss  headache  loss of appetite  loss of memory  mouth sores  rash This list may not describe all possible side effects. Call your doctor for medical advice about side effects. You may report side effects to FDA at 1-800-FDA-1088. Where should I keep my medicine? This drug is given in a hospital or clinic and will not be stored at home. NOTE: This sheet is a summary. It may not cover all possible information. If you have questions about this medicine, talk to your doctor, pharmacist, or health care provider.  2020 Elsevier/Gold Standard (2016-11-22 09:57:40) Anemia  Anemia is a condition in which you do not have enough red blood cells or hemoglobin. Hemoglobin is a substance in red blood cells that carries oxygen. When you do not have enough red blood cells or hemoglobin (are anemic), your body cannot get enough oxygen and your organs may not work properly. As a result, you may feel very tired or have other problems. What are the causes? Common causes of anemia include:  Excessive bleeding. Anemia can be caused by excessive bleeding inside or outside the body, including bleeding from the intestine or from periods in women.  Poor nutrition.  Long-lasting (chronic) kidney, thyroid, and liver disease.  Bone marrow disorders.  Cancer and treatments for cancer.  HIV (human immunodeficiency virus) and AIDS (acquired immunodeficiency syndrome).  Treatments for HIV and AIDS.  Spleen problems.  Blood disorders.  Infections, medicines, and autoimmune disorders that destroy red blood cells. What are the signs or symptoms? Symptoms of this condition include:  Minor weakness.  Dizziness.  Headache.  Feeling heartbeats that are irregular or faster than normal (palpitations).  Shortness of breath,  especially with exercise.  Paleness.  Cold sensitivity.  Indigestion.  Nausea.  Difficulty sleeping.  Difficulty concentrating. Symptoms may occur suddenly or develop slowly. If your anemia is mild, you may not have symptoms. How is this diagnosed? This condition is diagnosed based on:  Blood tests.  Your medical history.  A physical exam.  Bone marrow biopsy. Your health care provider may also check your stool (feces) for blood and may do additional testing to look for the cause of your bleeding. You may also have other tests, including:  Imaging tests, such as a CT scan or MRI.  Endoscopy.  Colonoscopy. How is this treated? Treatment for this condition depends on the cause. If you continue to lose a lot of blood, you may need to be treated at a hospital. Treatment may include:  Taking supplements of iron, vitamin C58, or folic acid.  Taking a hormone medicine (erythropoietin) that can help to stimulate red blood cell growth.  Having a blood transfusion. This may be needed if you lose a lot of blood.  Making changes to your diet.  Having surgery to remove your spleen. Follow these instructions at home:  Take over-the-counter and prescription medicines only as told by your health care provider.  Take supplements only as told by your health care provider.  Follow any diet instructions that you were given.  Keep all follow-up visits as told by your health care provider. This is important. Contact a health care provider if:  You develop new bleeding anywhere in the body. Get help right away if:  You are very weak.  You are short of breath.  You have pain in your abdomen or chest.  You are dizzy or feel faint.  You have trouble concentrating.  You have bloody or black, tarry stools.  You vomit repeatedly or you vomit up blood. Summary  Anemia is a condition in which you do not have enough red blood cells or enough of a substance in your red blood  cells that carries oxygen (hemoglobin).  Symptoms may occur suddenly or develop slowly.  If your anemia is mild, you may not have symptoms.  This condition is diagnosed with blood tests as well as a medical history and physical exam. Other tests may be needed.  Treatment for this condition depends on the cause of the anemia. This information is not intended to replace advice given to you by your health care provider. Make sure you discuss any questions you have with your health care provider. Document Revised: 03/11/2017 Document Reviewed: 04/30/2016 Elsevier Patient Education  [place].

## 2019-08-01 ENCOUNTER — Other Ambulatory Visit: Payer: Self-pay | Admitting: Family

## 2019-08-01 ENCOUNTER — Other Ambulatory Visit: Payer: Self-pay

## 2019-08-01 ENCOUNTER — Inpatient Hospital Stay: Payer: PPO

## 2019-08-01 VITALS — BP 126/55 | HR 75 | Temp 97.5°F | Resp 18

## 2019-08-01 DIAGNOSIS — D649 Anemia, unspecified: Secondary | ICD-10-CM

## 2019-08-01 DIAGNOSIS — C679 Malignant neoplasm of bladder, unspecified: Secondary | ICD-10-CM

## 2019-08-01 DIAGNOSIS — C772 Secondary and unspecified malignant neoplasm of intra-abdominal lymph nodes: Secondary | ICD-10-CM

## 2019-08-01 DIAGNOSIS — Z5111 Encounter for antineoplastic chemotherapy: Secondary | ICD-10-CM | POA: Diagnosis not present

## 2019-08-01 MED ORDER — SODIUM CHLORIDE 0.9 % IV SOLN
400.0000 mg/m2 | Freq: Once | INTRAVENOUS | Status: AC
  Administered 2019-08-01: 1000 mg via INTRAVENOUS
  Filled 2019-08-01: qty 10

## 2019-08-01 MED ORDER — DIPHENHYDRAMINE HCL 25 MG PO CAPS
25.0000 mg | ORAL_CAPSULE | Freq: Once | ORAL | Status: AC
  Administered 2019-08-01: 25 mg via ORAL

## 2019-08-01 MED ORDER — SODIUM CHLORIDE 0.9 % IV SOLN
Freq: Once | INTRAVENOUS | Status: AC
  Filled 2019-08-01: qty 250

## 2019-08-01 MED ORDER — SODIUM CHLORIDE 0.9 % IV SOLN
Freq: Once | INTRAVENOUS | Status: AC
  Filled 2019-08-01: qty 60

## 2019-08-01 MED ORDER — PALONOSETRON HCL INJECTION 0.25 MG/5ML
0.2500 mg | Freq: Once | INTRAVENOUS | Status: AC
  Administered 2019-08-01: 0.25 mg via INTRAVENOUS

## 2019-08-01 MED ORDER — ACETAMINOPHEN 325 MG PO TABS
ORAL_TABLET | ORAL | Status: AC
  Filled 2019-08-01: qty 2

## 2019-08-01 MED ORDER — PALONOSETRON HCL INJECTION 0.25 MG/5ML
INTRAVENOUS | Status: AC
  Filled 2019-08-01: qty 5

## 2019-08-01 MED ORDER — SODIUM CHLORIDE 0.9 % IV SOLN
10.0000 mg | Freq: Once | INTRAVENOUS | Status: AC
  Administered 2019-08-01: 10 mg via INTRAVENOUS
  Filled 2019-08-01: qty 10

## 2019-08-01 MED ORDER — HEPARIN SOD (PORK) LOCK FLUSH 100 UNIT/ML IV SOLN
500.0000 [IU] | Freq: Once | INTRAVENOUS | Status: DC | PRN
  Filled 2019-08-01: qty 5

## 2019-08-01 MED ORDER — ACETAMINOPHEN 325 MG PO TABS
650.0000 mg | ORAL_TABLET | Freq: Once | ORAL | Status: AC
  Administered 2019-08-01: 650 mg via ORAL

## 2019-08-01 MED ORDER — HOT PACK MISC ONCOLOGY
1.0000 | Freq: Once | Status: DC | PRN
  Filled 2019-08-01: qty 1

## 2019-08-01 MED ORDER — FUROSEMIDE 10 MG/ML IJ SOLN: 20.0000 mg | Freq: Once | INTRAMUSCULAR | Status: DC

## 2019-08-01 MED ORDER — SODIUM CHLORIDE 0.9% FLUSH
10.0000 mL | INTRAVENOUS | Status: DC | PRN
  Filled 2019-08-01: qty 10

## 2019-08-01 MED ORDER — DIPHENHYDRAMINE HCL 25 MG PO CAPS
ORAL_CAPSULE | ORAL | Status: AC
  Filled 2019-08-01: qty 1

## 2019-08-01 MED ORDER — PEGFILGRASTIM 6 MG/0.6ML ~~LOC~~ PSKT
6.0000 mg | PREFILLED_SYRINGE | Freq: Once | SUBCUTANEOUS | Status: AC
  Administered 2019-08-01: 6 mg via SUBCUTANEOUS

## 2019-08-01 MED ORDER — PEGFILGRASTIM 6 MG/0.6ML ~~LOC~~ PSKT
PREFILLED_SYRINGE | SUBCUTANEOUS | Status: AC
  Filled 2019-08-01: qty 0.6

## 2019-08-02 ENCOUNTER — Other Ambulatory Visit: Payer: Self-pay | Admitting: Hematology & Oncology

## 2019-08-02 ENCOUNTER — Other Ambulatory Visit (HOSPITAL_COMMUNITY)
Admission: RE | Admit: 2019-08-02 | Discharge: 2019-08-02 | Disposition: A | Discharge status: Home / Self Care | Payer: PPO | Source: Ambulatory Visit | Attending: Urology | Admitting: Urology

## 2019-08-02 DIAGNOSIS — Z01812 Encounter for preprocedural laboratory examination: Secondary | ICD-10-CM | POA: Insufficient documentation

## 2019-08-02 DIAGNOSIS — Z20822 Contact with and (suspected) exposure to covid-19: Secondary | ICD-10-CM | POA: Diagnosis not present

## 2019-08-02 LAB — TYPE AND SCREEN
ABO/RH(D): A POS
Antibody Screen: NEGATIVE
Unit division: 0
Unit division: 0

## 2019-08-02 LAB — SARS CORONAVIRUS 2 (TAT 6-24 HRS): SARS Coronavirus 2: NEGATIVE

## 2019-08-02 LAB — BPAM RBC
Blood Product Expiration Date: 202105072359
Blood Product Expiration Date: 202105072359
ISSUE DATE / TIME: 202104200741
ISSUE DATE / TIME: 202104210751
Unit Type and Rh: 6200
Unit Type and Rh: 6200

## 2019-08-06 ENCOUNTER — Encounter (HOSPITAL_BASED_OUTPATIENT_CLINIC_OR_DEPARTMENT_OTHER): Payer: Self-pay | Admitting: Urology

## 2019-08-06 ENCOUNTER — Ambulatory Visit (HOSPITAL_BASED_OUTPATIENT_CLINIC_OR_DEPARTMENT_OTHER): Payer: PPO | Admitting: Physician Assistant

## 2019-08-06 ENCOUNTER — Encounter (HOSPITAL_BASED_OUTPATIENT_CLINIC_OR_DEPARTMENT_OTHER)
Admission: RE | Disposition: A | Discharge status: Home / Self Care | Payer: Self-pay | Source: Home / Self Care | Attending: Urology

## 2019-08-06 ENCOUNTER — Ambulatory Visit (HOSPITAL_BASED_OUTPATIENT_CLINIC_OR_DEPARTMENT_OTHER)
Admission: RE | Admit: 2019-08-06 | Discharge: 2019-08-06 | Disposition: A | Discharge status: Home / Self Care | Payer: PPO | Attending: Urology | Admitting: Urology

## 2019-08-06 DIAGNOSIS — Z9079 Acquired absence of other genital organ(s): Secondary | ICD-10-CM | POA: Insufficient documentation

## 2019-08-06 DIAGNOSIS — C678 Malignant neoplasm of overlapping sites of bladder: Secondary | ICD-10-CM | POA: Diagnosis not present

## 2019-08-06 DIAGNOSIS — C772 Secondary and unspecified malignant neoplasm of intra-abdominal lymph nodes: Secondary | ICD-10-CM | POA: Insufficient documentation

## 2019-08-06 DIAGNOSIS — N183 Chronic kidney disease, stage 3 unspecified: Secondary | ICD-10-CM | POA: Diagnosis not present

## 2019-08-06 DIAGNOSIS — C775 Secondary and unspecified malignant neoplasm of intrapelvic lymph nodes: Secondary | ICD-10-CM

## 2019-08-06 DIAGNOSIS — I129 Hypertensive chronic kidney disease with stage 1 through stage 4 chronic kidney disease, or unspecified chronic kidney disease: Secondary | ICD-10-CM | POA: Insufficient documentation

## 2019-08-06 DIAGNOSIS — I4891 Unspecified atrial fibrillation: Secondary | ICD-10-CM | POA: Diagnosis not present

## 2019-08-06 DIAGNOSIS — Z8546 Personal history of malignant neoplasm of prostate: Secondary | ICD-10-CM | POA: Insufficient documentation

## 2019-08-06 DIAGNOSIS — Z87891 Personal history of nicotine dependence: Secondary | ICD-10-CM | POA: Diagnosis not present

## 2019-08-06 DIAGNOSIS — C679 Malignant neoplasm of bladder, unspecified: Secondary | ICD-10-CM | POA: Diagnosis not present

## 2019-08-06 DIAGNOSIS — M1990 Unspecified osteoarthritis, unspecified site: Secondary | ICD-10-CM | POA: Diagnosis not present

## 2019-08-06 DIAGNOSIS — I10 Essential (primary) hypertension: Secondary | ICD-10-CM | POA: Diagnosis not present

## 2019-08-06 HISTORY — DX: Dyspnea, unspecified: R06.00

## 2019-08-06 HISTORY — DX: Anemia, unspecified: D64.9

## 2019-08-06 HISTORY — PX: TRANSURETHRAL RESECTION OF BLADDER TUMOR WITH MITOMYCIN-C: SHX6459

## 2019-08-06 LAB — POCT I-STAT, CHEM 8
BUN: 52 mg/dL — ABNORMAL HIGH (ref 8–23)
Calcium, Ion: 1.11 mmol/L — ABNORMAL LOW (ref 1.15–1.40)
Chloride: 100 mmol/L (ref 98–111)
Creatinine, Ser: 2 mg/dL — ABNORMAL HIGH (ref 0.61–1.24)
Glucose, Bld: 114 mg/dL — ABNORMAL HIGH (ref 70–99)
HCT: 29 % — ABNORMAL LOW (ref 39.0–52.0)
Hemoglobin: 9.9 g/dL — ABNORMAL LOW (ref 13.0–17.0)
Potassium: 4 mmol/L (ref 3.5–5.1)
Sodium: 137 mmol/L (ref 135–145)
TCO2: 28 mmol/L (ref 22–32)

## 2019-08-06 SURGERY — TRANSURETHRAL RESECTION OF BLADDER TUMOR WITH MITOMYCIN-C
Anesthesia: General

## 2019-08-06 MED ORDER — SODIUM CHLORIDE 0.9 % IR SOLN
Status: DC | PRN
  Administered 2019-08-06 (×2): 3000 mL

## 2019-08-06 MED ORDER — CEFAZOLIN SODIUM-DEXTROSE 2-4 GM/100ML-% IV SOLN
2.0000 g | INTRAVENOUS | Status: AC
  Administered 2019-08-06: 08:00:00 2 g via INTRAVENOUS

## 2019-08-06 MED ORDER — LIDOCAINE 2% (20 MG/ML) 5 ML SYRINGE
INTRAMUSCULAR | Status: AC
  Filled 2019-08-06: qty 5

## 2019-08-06 MED ORDER — PHENYLEPHRINE 40 MCG/ML (10ML) SYRINGE FOR IV PUSH (FOR BLOOD PRESSURE SUPPORT)
PREFILLED_SYRINGE | INTRAVENOUS | Status: AC
  Filled 2019-08-06: qty 10

## 2019-08-06 MED ORDER — CEFAZOLIN SODIUM-DEXTROSE 1-4 GM/50ML-% IV SOLN
INTRAVENOUS | Status: DC | PRN
  Administered 2019-08-06: 1 g via INTRAVENOUS

## 2019-08-06 MED ORDER — PROPOFOL 10 MG/ML IV BOLUS
INTRAVENOUS | Status: DC | PRN
  Administered 2019-08-06: 200 mg via INTRAVENOUS

## 2019-08-06 MED ORDER — OXYCODONE HCL 5 MG/5ML PO SOLN: 5.0000 mg | Freq: Once | ORAL | Status: DC | PRN

## 2019-08-06 MED ORDER — PHENYLEPHRINE 40 MCG/ML (10ML) SYRINGE FOR IV PUSH (FOR BLOOD PRESSURE SUPPORT)
PREFILLED_SYRINGE | INTRAVENOUS | Status: DC | PRN
  Administered 2019-08-06: 120 ug via INTRAVENOUS
  Administered 2019-08-06: 80 ug via INTRAVENOUS

## 2019-08-06 MED ORDER — DEXAMETHASONE SODIUM PHOSPHATE 10 MG/ML IJ SOLN
INTRAMUSCULAR | Status: DC | PRN
  Administered 2019-08-06: 10 mg via INTRAVENOUS

## 2019-08-06 MED ORDER — ONDANSETRON HCL 4 MG/2ML IJ SOLN
INTRAMUSCULAR | Status: AC
  Filled 2019-08-06: qty 2

## 2019-08-06 MED ORDER — EPHEDRINE 5 MG/ML INJ
INTRAVENOUS | Status: AC
  Filled 2019-08-06: qty 10

## 2019-08-06 MED ORDER — EPHEDRINE SULFATE-NACL 50-0.9 MG/10ML-% IV SOSY
PREFILLED_SYRINGE | INTRAVENOUS | Status: DC | PRN
  Administered 2019-08-06: 10 mg via INTRAVENOUS
  Administered 2019-08-06: 15 mg via INTRAVENOUS

## 2019-08-06 MED ORDER — FENTANYL CITRATE (PF) 100 MCG/2ML IJ SOLN
INTRAMUSCULAR | Status: AC
  Filled 2019-08-06: qty 2

## 2019-08-06 MED ORDER — DEXAMETHASONE SODIUM PHOSPHATE 10 MG/ML IJ SOLN
INTRAMUSCULAR | Status: AC
  Filled 2019-08-06: qty 1

## 2019-08-06 MED ORDER — OXYCODONE HCL 5 MG PO TABS: 5.0000 mg | ORAL_TABLET | Freq: Once | ORAL | Status: DC | PRN

## 2019-08-06 MED ORDER — SODIUM CHLORIDE 0.9 % IV SOLN: INTRAVENOUS | Status: DC

## 2019-08-06 MED ORDER — ONDANSETRON HCL 4 MG/2ML IJ SOLN
INTRAMUSCULAR | Status: DC | PRN
  Administered 2019-08-06: 4 mg via INTRAVENOUS

## 2019-08-06 MED ORDER — ACETAMINOPHEN 10 MG/ML IV SOLN: 1000.0000 mg | Freq: Once | INTRAVENOUS | Status: DC | PRN

## 2019-08-06 MED ORDER — MIDAZOLAM HCL 2 MG/2ML IJ SOLN
INTRAMUSCULAR | Status: DC | PRN
  Administered 2019-08-06: 1 mg via INTRAVENOUS

## 2019-08-06 MED ORDER — CEPHALEXIN 500 MG PO CAPS: 500.0000 mg | ORAL_CAPSULE | Freq: Two times a day (BID) | ORAL | 0 refills | Status: AC

## 2019-08-06 MED ORDER — FENTANYL CITRATE (PF) 100 MCG/2ML IJ SOLN
25.0000 ug | INTRAMUSCULAR | Status: AC | PRN
  Administered 2019-08-06 (×6): 25 ug via INTRAVENOUS

## 2019-08-06 MED ORDER — FENTANYL CITRATE (PF) 100 MCG/2ML IJ SOLN
INTRAMUSCULAR | Status: DC | PRN
  Administered 2019-08-06: 50 ug via INTRAVENOUS

## 2019-08-06 MED ORDER — ARTIFICIAL TEARS OPHTHALMIC OINT
TOPICAL_OINTMENT | OPHTHALMIC | Status: AC
  Filled 2019-08-06: qty 3.5

## 2019-08-06 MED ORDER — CEFAZOLIN SODIUM-DEXTROSE 1-4 GM/50ML-% IV SOLN
INTRAVENOUS | Status: AC
  Filled 2019-08-06: qty 50

## 2019-08-06 MED ORDER — GEMCITABINE CHEMO FOR BLADDER INSTILLATION 2000 MG
2000.0000 mg | Freq: Once | INTRAVENOUS | Status: AC
  Administered 2019-08-06: 2000 mg via INTRAVESICAL
  Filled 2019-08-06: qty 2000

## 2019-08-06 MED ORDER — PROPOFOL 10 MG/ML IV BOLUS
INTRAVENOUS | Status: AC
  Filled 2019-08-06: qty 40

## 2019-08-06 MED ORDER — LIDOCAINE 2% (20 MG/ML) 5 ML SYRINGE
INTRAMUSCULAR | Status: DC | PRN
  Administered 2019-08-06: 100 mg via INTRAVENOUS

## 2019-08-06 MED ORDER — CEFAZOLIN SODIUM-DEXTROSE 2-4 GM/100ML-% IV SOLN
INTRAVENOUS | Status: AC
  Filled 2019-08-06: qty 100

## 2019-08-06 MED ORDER — MIDAZOLAM HCL 2 MG/2ML IJ SOLN
INTRAMUSCULAR | Status: AC
  Filled 2019-08-06: qty 2

## 2019-08-06 SURGICAL SUPPLY — 23 items
BAG DRAIN URO-CYSTO SKYTR STRL (DRAIN) ×3 IMPLANT
BAG DRN UROCATH (DRAIN) ×1
CATH FOLEY 2WAY SLVR  5CC 18FR (CATHETERS) ×3
CATH FOLEY 2WAY SLVR 5CC 18FR (CATHETERS) IMPLANT
CLOTH BEACON ORANGE TIMEOUT ST (SAFETY) ×3 IMPLANT
GLOVE BIO SURGEON STRL SZ8 (GLOVE) ×3 IMPLANT
GLOVE BIOGEL PI IND STRL 6.5 (GLOVE) IMPLANT
GLOVE BIOGEL PI INDICATOR 6.5 (GLOVE) ×4
GOWN STRL REUS W/TWL LRG LVL3 (GOWN DISPOSABLE) ×2 IMPLANT
GOWN STRL REUS W/TWL XL LVL3 (GOWN DISPOSABLE) ×3 IMPLANT
HOLDER FOLEY CATH W/STRAP (MISCELLANEOUS) ×2 IMPLANT
IV NS IRRIG 3000ML ARTHROMATIC (IV SOLUTION) ×3 IMPLANT
KIT TURNOVER CYSTO (KITS) ×3 IMPLANT
LOOP CUT BIPOLAR 24F LRG (ELECTROSURGICAL) ×3 IMPLANT
MANIFOLD NEPTUNE II (INSTRUMENTS) ×3 IMPLANT
NS IRRIG 500ML POUR BTL (IV SOLUTION) ×3 IMPLANT
PLUG CATH AND CAP STER (CATHETERS) ×2 IMPLANT
SYR TOOMEY IRRIG 70ML (MISCELLANEOUS) ×3
SYRINGE TOOMEY IRRIG 70ML (MISCELLANEOUS) ×1 IMPLANT
TRAY CYSTO PACK (CUSTOM PROCEDURE TRAY) ×3 IMPLANT
TUBE CONNECTING 12'X1/4 (SUCTIONS) ×1
TUBE CONNECTING 12X1/4 (SUCTIONS) ×2 IMPLANT
TUBING UROLOGY SET (TUBING) ×2 IMPLANT

## 2019-08-06 NOTE — Anesthesia Procedure Notes (Signed)
Procedure Name: LMA Insertion Date/Time: 08/06/2019 7:41 AM Performed by: Mechele Claude, CRNA Pre-anesthesia Checklist: Patient identified, Emergency Drugs available, Suction available and Patient being monitored Patient Re-evaluated:Patient Re-evaluated prior to induction Oxygen Delivery Method: Circle system utilized Preoxygenation: Pre-oxygenation with 100% oxygen Induction Type: IV induction Ventilation: Mask ventilation without difficulty LMA: LMA inserted LMA Size: 5.0 Number of attempts: 1 Airway Equipment and Method: Bite block Placement Confirmation: positive ETCO2 Tube secured with: Tape Dental Injury: Teeth and Oropharynx as per pre-operative assessment

## 2019-08-06 NOTE — Discharge Instructions (Signed)
1. You may see some blood in the urine and may have some burning with urination for 48-72 hours. You also may notice that you have to urinate more frequently or urgently after your procedure which is normal.  2. You should call should you develop an inability urinate, fever > 101, persistent nausea and vomiting that prevents you from eating or drinking to stay hydrated.  3. If you have a catheter, you will be taught how to take care of the catheter by the nursing staff prior to discharge from the hospital.  It is okay to remove the catheter on Tuesday morning as instructed by the nurses.  You may periodically feel a strong urge to void with the catheter in place.  This is a bladder spasm and most often can occur when having a bowel movement or moving around. It is typically self-limited and usually will stop after a few minutes.  You may use some Vaseline or Neosporin around the tip of the catheter to reduce friction at the tip of the penis. You may also see some blood in the urine.  A very small amount of blood can make the urine look quite red.  As long as the catheter is draining well, there usually is not a problem.  However, if the catheter is not draining well and is bloody, you should call the office [phone]) to notify us.  Transurethral Resection of Bladder Tumor, Care After This sheet gives you information about how to care for yourself after your procedure. Your health care provider may also give you more specific instructions. If you have problems or questions, contact your health care provider. What can I expect after the procedure? After the procedure, it is common to have:  A small amount of blood in your urine for up to 2 weeks.  Soreness or mild pain from your catheter. After your catheter is removed, you may have mild soreness, especially when urinating.  Pain in your lower abdomen. Follow these instructions at home: Medicines   Take over-the-counter and prescription  medicines only as told by your health care provider.  If you were prescribed an antibiotic medicine, take it as told by your health care provider. Do not stop taking the antibiotic even if you start to feel better.  Do not drive for 24 hours if you were given a sedative during your procedure.  Ask your health care provider if the medicine prescribed to you: ? Requires you to avoid driving or using heavy machinery. ? Can cause constipation. You may need to take these actions to prevent or treat constipation:  Take over-the-counter or prescription medicines.  Eat foods that are high in fiber, such as beans, whole grains, and fresh fruits and vegetables.  Limit foods that are high in fat and processed sugars, such as fried or sweet foods. Activity  Return to your normal activities as told by your health care provider. Ask your health care provider what activities are safe for you.  Do not lift anything that is heavier than 10 lb (4.5 kg), or the limit that you are told, until your health care provider says that it is safe.  Avoid intense physical activity for as long as told by your health care provider.  Rest as told by your health care provider.  Avoid sitting for a long time without moving. Get up to take short walks every 1-2 hours. This is important to improve blood flow and breathing. Ask for help if you feel weak or unsteady.  General instructions   Do not drink alcohol for as long as told by your health care provider. This is especially important if you are taking prescription pain medicines.  Do not take baths, swim, or use a hot tub until your health care provider approves. Ask your health care provider if you may take showers. You may only be allowed to take sponge baths.  If you have a catheter, follow instructions from your health care provider about caring for your catheter and your drainage bag.  Drink enough fluid to keep your urine pale yellow.  Wear compression  stockings as told by your health care provider. These stockings help to prevent blood clots and reduce swelling in your legs.  Keep all follow-up visits as told by your health care provider. This is important. ? You will need to be followed closely with regular checks of your bladder and urethra (cystoscopies) to make sure that the cancer does not come back. Contact a health care provider if:  You have pain that gets worse or does not improve with medicine.  You have blood in your urine for more than 2 weeks.  You have cloudy or bad-smelling urine.  You become constipated. Signs of constipation may include having: ? Fewer than three bowel movements in a week. ? Difficulty having a bowel movement. ? Stools that are dry, hard, or larger than normal.  You have a fever. Get help right away if:  You have: ? Severe pain. ? Bright red blood in your urine. ? Blood clots in your urine. ? A lot of blood in your urine.  Your catheter has been removed and you are not able to urinate.  You have a catheter in place and the catheter is not draining urine. Summary  After your procedure, it is common to have a small amount of blood in your urine, soreness or mild pain from your catheter, and pain in your lower abdomen.  Take over-the-counter and prescription medicines only as told by your health care provider.  Rest as told by your health care provider. Follow your health care provider's instructions about returning to normal activities. Ask what activities are safe for you.  If you have a catheter, follow instructions from your health care provider about caring for your catheter and your drainage bag.  Get help right away if you cannot urinate, you have severe pain, or you have bright red blood or blood clots in your urine. This information is not intended to replace advice given to you by your health care provider. Make sure you discuss any questions you have with your health care  provider. Document Revised: 10/27/2017 Document Reviewed: 10/27/2017 Elsevier Patient Education  [place], Adult An indwelling urinary catheter is a thin tube that is put into your bladder. The tube helps to drain pee (urine) out of your body. The tube goes in through your urethra. Your urethra is where pee comes out of your body. Your pee will come out through the catheter, then it will go into a bag (drainage bag). Take good care of your catheter so it will work well. How to wear your catheter and bag Supplies needed  Sticky tape (adhesive tape) or a leg strap.  Alcohol wipe or soap and water (if you use tape).  A clean towel (if you use tape).  Large overnight bag.  Smaller bag (leg bag). Wearing your catheter Attach your catheter to your leg with tape or a leg strap.  Make sure the catheter is not pulled tight.  If a leg strap gets wet, take it off and put on a dry strap.  If you use tape to hold the bag on your leg: 1. Use an alcohol wipe or soap and water to wash your skin where the tape made it sticky before. 2. Use a clean towel to pat-dry that skin. 3. Use new tape to make the bag stay on your leg. Wearing your bags You should have been given a large overnight bag.  You may wear the overnight bag in the day or night.  Always have the overnight bag lower than your bladder.  Do not let the bag touch the floor.  Before you go to sleep, put a clean plastic bag in a wastebasket. Then hang the overnight bag inside the wastebasket. How to care for your skin and catheter Supplies needed  A clean washcloth.  Water and mild soap.  A clean towel. Caring for your skin and catheter      Clean the skin around your catheter every day: 1. Wash your hands with soap and water. 2. Wet a clean washcloth in warm water and mild soap. 3. Clean the skin around your urethra.  If you are male:  Gently spread the folds of skin  around your vagina (labia).  With the washcloth in your other hand, wipe the inner side of your labia on each side. Wipe from front to back.  If you are male:  Pull back any skin that covers the end of your penis (foreskin).  With the washcloth in your other hand, wipe your penis in small circles. Start wiping at the tip of your penis, then move away from the catheter.  Move the foreskin back in place, if needed. 4. With your free hand, hold the catheter close to where it goes into your body.  Keep holding the catheter during cleaning so it does not get pulled out. 5. With the washcloth in your other hand, clean the catheter.  Only wipe downward on the catheter.  Do not wipe upward toward your body. Doing this may push germs into your urethra and cause infection. 6. Use a clean towel to pat-dry the catheter and the skin around it. Make sure to wipe off all soap. 7. Wash your hands with soap and water.  Shower every day. Do not take baths.  Do not use cream, ointment, or lotion on the area where the catheter goes into your body, unless your doctor tells you to.  Do not use powders, sprays, or lotions on your genital area.  Check your skin around the catheter every day for signs of infection. Check for: ? Redness, swelling, or pain. ? Fluid or blood. ? Warmth. ? Pus or a bad smell. How to empty the bag Supplies needed  Rubbing alcohol.  Gauze pad or cotton ball.  Tape or a leg strap. Emptying the bag Pour the pee out of your bag when it is ?- full, or at least 2-3 times a day. Do this for your overnight bag and your leg bag. 1. Wash your hands with soap and water. 2. Separate (detach) the bag from your leg. 3. Hold the bag over the toilet or a clean pail. Keep the bag lower than your hips and bladder. This is so the pee (urine) does not go back into the tube. 4. Open the pour spout. It is at the bottom of the bag. 5. Empty the pee into the toilet  or pail. Do not let the  pour spout touch any surface. 6. Put rubbing alcohol on a gauze pad or cotton ball. 7. Use the gauze pad or cotton ball to clean the pour spout. 8. Close the pour spout. 9. Attach the bag to your leg with tape or a leg strap. 10. Wash your hands with soap and water. Follow instructions for cleaning the drainage bag:  From the product maker.  As told by your doctor. General rules   Never pull on your catheter. Never try to take it out. Doing that can hurt you.  Always wash your hands before and after you touch your catheter or bag. Use a mild, fragrance-free soap. If you do not have soap and water, use hand sanitizer.  Always make sure there are no twists or bends (kinks) in the catheter tube.  Always make sure there are no leaks in the catheter or bag.  Drink enough fluid to keep your pee pale yellow.  Do not take baths, swim, or use a hot tub.  If you are male, wipe from front to back after you poop (have a bowel movement). Contact a doctor if:  Your pee is cloudy.  Your pee smells worse than usual.  Your catheter gets clogged.  Your catheter leaks.  Your bladder feels full. Get help right away if:  You have redness, swelling, or pain where the catheter goes into your body.  You have fluid, blood, pus, or a bad smell coming from the area where the catheter goes into your body.  Your skin feels warm where the catheter goes into your body.  You have a fever.  You have pain in your: ? Belly (abdomen). ? Legs. ? Lower back. ? Bladder.  You see blood in the catheter.  Your pee is pink or red.  You feel sick to your stomach (nauseous).  You throw up (vomit).  You have chills.  Your pee is not draining into the bag.  Your catheter gets pulled out. Summary  An indwelling urinary catheter is a thin tube that is placed into the bladder to help drain pee (urine) out of the body.  The catheter is placed into the part of the body that drains pee from the  bladder (urethra).  Taking good care of your catheter will keep it working properly and help prevent problems.  Always wash your hands before and after touching your catheter or bag.  Never pull on your catheter or try to take it out. This information is not intended to replace advice given to you by your health care provider. Make sure you discuss any questions you have with your health care provider. Document Revised: 07/21/2018 Document Reviewed: 11/12/2016 Elsevier Patient Education  [place] Instructions  Activity: Get plenty of rest for the remainder of the day. A responsible individual must stay with you for 24 hours following the procedure.  For the next 24 hours, DO NOT: -Drive a car -Paediatric nurse -Drink alcoholic beverages -Take any medication unless instructed by your physician -Make any legal decisions or sign important papers.  Meals: Start with liquid foods such as gelatin or soup. Progress to regular foods as tolerated. Avoid greasy, spicy, heavy foods. If nausea and/or vomiting occur, drink only clear liquids until the nausea and/or vomiting subsides. Call your physician if vomiting continues.  Special Instructions/Symptoms: Your throat may feel dry or sore from the anesthesia or the breathing tube placed in your throat during  surgery. If this causes discomfort, gargle with warm salt water. The discomfort should disappear within 24 hours.

## 2019-08-06 NOTE — Op Note (Signed)
Preoperative diagnosis: Urothelial carcinoma of the bladder, recurrent  Postoperative diagnosis: Same, 2 x 6 cm bladder tumor  Principal procedure: TURBT of bladder tumor, greater than 5 cm, placement of postoperative gemcitabine and bladder  Surgeon: Kipper Buch  Anesthesia: General with LMA  Complications: None  Specimen: Bladder tumor fragments, to pathology  Drains: 18 French Foley catheter  Estimated blood loss: Less than 5 mL  Indications: 69 year old male with urothelial carcinoma the bladder, high-grade, initially diagnosed approximately 3 years ago.  Initial resection was then performed.  There was metastatic lymphadenopathy, this has been treated with systemic chemotherapy by [name].  He presents at this time for repeat resection.  The patient does have persistent gross hematuria, painless in nature.  Recent cystoscopy revealed the patient to have persistent bladder tumor, mainly on the right posterior lateral wall.  I have discussed the procedure of TURBT with the patient, as well as probable placement of postoperative intravesical gemcitabine.  He understands the procedure, its risks and complications, and desires to proceed.  Findings: Urethra was normal.  Prostate was surgically absent.  Bladder was inspected circumferentially.  Ureteral orifice ease were normal in their location and configuration.  The majority of the urothelium the bladder was normal.  There was some erythematous urothelium on the left lateral wall, approximately 2 cm in size.  There was a 2 cm x 6 cm strip of raised, low-lying papillary urothelium starting in the posterior midline, extending to the right lateral wall.  Additionally, there were several dystrophic calcifications in the midst of this tumor.  No other urothelial lesions were noted.  Description of procedure: The patient was properly identified in the holding area.  He received preoperative IV antibiotics.  Was taken to the operating room where  general anesthetic was administered with the LMA.  He was placed in the dorsolithotomy position.  Genitalia and perineum were prepped and draped.  Proper timeout was performed.  68 French resectoscope sheath was passed through the urethra using the visual obturator.  The above-mentioned findings were noted.  Following thorough inspection of the bladder, the obturator was removed and replaced with the resectoscope element and cutting loop.  I first cauterized the left lateral wall where there was erythematous urothelium.  There was no resection done in this area.  The dystrophic calcifications in the midst of the persistent bladder tumor were scraped off using the loop.  I then resected this abnormal, most likely tumor bearing urothelium into the muscular layer using the resectoscope loop.  The entire abnormal area was resected, deep into the muscle.  Following resection, the involved area was then cauterized.  There was excellent hemostasis.  Bladder tumor fragments were rinsed from the bladder.  These were sent for permanent pathology labeled "bladder tumor".  I inspected the resected site.  There is no bleeding.  At this point, the scope was removed.  68 French Foley catheter was placed, balloon inflated with 10 cc of water.  The bladder was drained and a catheter plug was placed.  At this point, the patient was awakened.  He was taken to the PACU in stable condition, having tolerated the procedure well.  In the PACU, 2 g of gemcitabine and diluent were then instilled in the bladder.  It was left instilled for 1 hour and then drained.

## 2019-08-06 NOTE — H&P (Signed)
[name]  Chief Complaint: Bladder cancer  History of Present Illness: 69 yo male w/ h/o urothelial carcinoma of the bladder--initially dx'ed in 2018 following presentation w/ hematuria. At the time of initial resection in June of thast year he had mets to lymph nodes. He has been followed by Dr Myna Hidalgo for systemic chemotherapy. HE recently presented back to [facility] w/ recurrent hematuria and was found to have large areas in bladder w/ recurrence. HE presents today for repeat resection.  Past Medical History:  Diagnosis Date  . Anemia   . Arthritis   . 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 (HCC) 09/29/2016  . Bladder tumor   . Chronic kidney disease    ckd stage 3, sees dr every 6 months, arf hemodialysis done x 2 2020  . Dyspnea    with exertion  . Dysrhythmia    atrial fib  . Essential hypertension 06/23/2018  . Goals of care, counseling/discussion 09/30/2016  . History of prostate cancer followed by pcp dr Tiburcio Pea-  per pt last PSA undetectable   dx 2008-- (Stage T1c, Gleason 3+3,  PSA 4.58, vol 99cc)  s/p  radical prostatectomy (nerve sparing bilateral)   . Hyperglycemia 06/23/2018  . Hypertension   . Mild hyperlipidemia 06/23/2018  . Pre-diabetes   . Wears glasses     Past Surgical History:  Procedure Laterality Date  . CARDIOVERSION N/A 05/18/2019   Procedure: CARDIOVERSION;  Surgeon: Lewayne Bunting, MD;  Location: [facility] ENDOSCOPY;  Service: Cardiovascular;  Laterality: N/A;  . CATARACT EXTRACTION W/ INTRAOCULAR LENS  IMPLANT, BILATERAL Bilateral 2011  . CERVICAL SPINE SURGERY  1997  . IR FLUORO GUIDE PORT INSERTION RIGHT  10/07/2016  . IR RADIOLOGIST EVAL & MGMT  01/05/2018  . IR US GUIDE VASC ACCESS RIGHT  10/07/2016  . KNEE ARTHROSCOPY Bilateral right 2006;  left 02-15-2007  . LUMBAR SPINE SURGERY  1999;  1990;  1983  . ROBOT ASSISTED LAPAROSCOPIC RADICAL PROSTATECTOMY  06/20/2006   bilateral nerve sparing  . TEE WITHOUT CARDIOVERSION N/A 06/27/2018   Procedure:  TRANSESOPHAGEAL ECHOCARDIOGRAM (TEE);  Surgeon: Elder Negus, MD;  Location[facility] END[facility]COPY;  Service: Cardiovascular;  Laterality: N/A;  . TOTAL HIP ARTHROPLASTY Left 03/03/2015   Procedure: LEFT TOTAL HIP ARTHROPLASTY ANTERIOR APPROACH;  Surgeon: Jodi Geralds, MD;  Location: [facility] OR;  Service: Orthopedics;  Laterality: Left;  . TOTAL KNEE ARTHROPLASTY Bilateral left 08-13-2009;  right 12-26-2009  . TRANSURETHRAL RESECTION OF BLADDER TUMOR N/A 09/20/2016   Procedure: TRANSURETHRAL RESECTION OF BLADDER TUMOR (TURBT);  Surgeon: Marcine Matar, MD;  Location: [facility];  Service: Urology;  Laterality: N/A;    Home Medications:    Allergies: No Known Allergies  Family History  Problem Relation Age of Onset  . Hypertension Mother   . Aneurysm Mother   . Emphysema Father   . Hypertension Father     Social History:  reports that he quit smoking about 34 years ago. His smoking use included cigarettes. He has a 16.00 pack-year smoking history. He has never used smokeless tobacco. He reports current alcohol use. He reports current drug use. Drug: Marijuana.  ROS: A complete review of systems was performed.  All systems are negative except for pertinent findings as noted.  Physical Exam:  Vital signs in last 24 hours: BP 130/85   Pulse 98   Temp 98.5 F (36.9 C) (Oral)   Resp 20   Ht 6\' 2"  (1.88 m)   Wt 127.6 kg   SpO2 93%  BMI 36.13 kg/m  Constitutional:  Alert and oriented, No acute distress Cardiovascular: Regular rate  Respiratory: Normal respiratory effort GI: Abdomen is soft, nontender, nondistended, no abdominal masses. No CVAT.  Genitourinary: Normal male phallus, testes are descended bilaterally and non-tender and without masses, scrotum is normal in appearance without lesions or masses, perineum is normal on inspection. Lymphatic: No lymphadenopathy Neurologic: Grossly intact, no focal deficits Psychiatric: Normal mood and affect  Laboratory  Data:  No results for input(s): WBC, HGB, HCT, PLT in the last 72 hours.  No results for input(s): NA, K, CL, GLUCOSE, BUN, CALCIUM, CREATININE in the last 72 hours.  Invalid input(s): CO3   No results found for this or any previous visit (from the past 24 hour(s)). Recent Results (from the past 240 hour(s))  SARS CORONAVIRUS 2 (TAT 6-24 HRS) Nasopharyngeal Nasopharyngeal Swab     Status: None   Collection Time: 08/02/19 10:10 AM   Specimen: Nasopharyngeal Swab  Result Value Ref Range Status   SARS Coronavirus 2 NEGATIVE NEGATIVE Final    Comment: (NOTE) SARS-CoV-2 target nucleic acids are NOT DETECTED. The SARS-CoV-2 RNA is generally detectable in upper and lower respiratory specimens during the acute phase of infection. Negative results do not preclude SARS-CoV-2 infection, do not rule out co-infections with other pathogens, and should not be used as the sole basis for treatment or other patient management decisions. Negative results must be combined with clinical observations, patient history, and epidemiological information. The expected result is Negative. Fact Sheet for Patients: SugarRoll.be Fact Sheet for Healthcare Providers: https://www.woods-mathews.com/ This test is not yet approved or cleared by the [place] FDA and  has been authorized for detection and/or diagnosis of SARS-CoV-2 by FDA under an Emergency Use Authorization (EUA). This EUA will remain  in effect (meaning this test can be used) for the duration of the COVID-19 declaration under Section 56 4(b)(1) of the Act, 21 U.S.C. section 360bbb-3(b)(1), unless the authorization is terminated or revoked sooner. Performed at [facility], [place] [address], [place], [place] [address]     Renal Function: Recent Labs    07/30/19 0840  CREATININE 2.14*   Estimated Creatinine Clearance: 47.6 mL/min (A) (by C-G formula based on SCr of 2.14 mg/dL  (H)).  Radiologic Imaging: No results found.  Impression/Assessment:  Recurrent urothelial carcinoma of the bkadder  Plan:  TURBT

## 2019-08-06 NOTE — Transfer of Care (Signed)
    Last Vitals:  Vitals Value Taken Time  BP 121/76 08/06/19 0817  Temp 36.7 C 08/06/19 0817  Pulse 75 08/06/19 0820  Resp 20 08/06/19 0821  SpO2 97 % 08/06/19 0820  Vitals shown include unvalidated device data.  Last Pain:  Vitals:   08/06/19 0608  TempSrc: Oral  PainSc: 0-No pain      Patients Stated Pain Goal: 5 (08/06/19 7062)  Immediate Anesthesia Transfer of Care Note  Patient: [name] Rose  Procedure(s) Performed: Procedure(s) (LRB): TRANSURETHRAL RESECTION OF BLADDER TUMOR (N/A)  Patient Location: PACU  Anesthesia Type: General  Level of Consciousness: awake, alert  and oriented  Airway & Oxygen Therapy: Patient Spontanous Breathing and Patient connected to nasal cannula oxygen  Post-op Assessment: Report given to PACU RN and Post -op Vital signs reviewed and stable  Post vital signs: Reviewed and stable  Complications: No apparent anesthesia complications

## 2019-08-06 NOTE — Anesthesia Postprocedure Evaluation (Signed)
Anesthesia Post Note  Patient: [name] Rose  Procedure(s) Performed: TRANSURETHRAL RESECTION OF BLADDER TUMOR (N/A )     Patient location during evaluation: PACU Anesthesia Type: General Level of consciousness: awake and alert Pain management: pain level controlled Vital Signs Assessment: post-procedure vital signs reviewed and stable Respiratory status: spontaneous breathing, nonlabored ventilation, respiratory function stable and patient connected to nasal cannula oxygen Cardiovascular status: blood pressure returned to baseline and stable Postop Assessment: no apparent nausea or vomiting Anesthetic complications: no    Last Vitals:  Vitals:   08/06/19 0915 08/06/19 0930  BP: 129/80   Pulse: 83 90  Resp: 17 15  Temp:    SpO2: 94% 93%    Last Pain:  Vitals:   08/06/19 0847  TempSrc:   PainSc: 7                  Annye Forrey S

## 2019-08-06 NOTE — Interval H&P Note (Signed)
History and Physical Interval Note:  08/06/2019 7:24 AM  [name] Rose  has presented today for surgery, with the diagnosis of BLADDER CANCER.  The various methods of treatment have been discussed with the patient and family. After consideration of risks, benefits and other options for treatment, the patient has consented to  Procedure(s) with comments: TRANSURETHRAL RESECTION OF BLADDER TUMOR WITH MITOMYCIN-C (N/A) - 90 MINS as a surgical intervention.  The patient's history has been reviewed, patient examined, no change in status, stable for surgery.  I have reviewed the patient's chart and labs.  Questions were answered to the patient's satisfaction.     Lillette Boxer Copper Basnett

## 2019-08-06 NOTE — Anesthesia Preprocedure Evaluation (Signed)
Anesthesia Evaluation  Patient identified by MRN, date of birth, ID band Patient awake    Reviewed: Allergy & Precautions, NPO status , Patient's Chart, lab work & pertinent test results  Airway Mallampati: II  TM Distance: >3 FB Neck ROM: Full    Dental no notable dental hx.    Pulmonary neg pulmonary ROS, former smoker,    Pulmonary exam normal breath sounds clear to auscultation       Cardiovascular hypertension, Normal cardiovascular exam+ dysrhythmias Atrial Fibrillation  Rhythm:Irregular Rate:Normal     Neuro/Psych negative neurological ROS  negative psych ROS   GI/Hepatic negative GI ROS, Neg liver ROS,   Endo/Other  negative endocrine ROS  Renal/GU ESRFRenal disease  negative genitourinary   Musculoskeletal negative musculoskeletal ROS (+)   Abdominal   Peds negative pediatric ROS (+)  Hematology negative hematology ROS (+) anemia ,   Anesthesia Other Findings   Reproductive/Obstetrics negative OB ROS                             Anesthesia Physical Anesthesia Plan  ASA: III  Anesthesia Plan: General   Post-op Pain Management:    Induction: Intravenous  PONV Risk Score and Plan: 2 and Ondansetron, Dexamethasone and Treatment may vary due to age or medical condition  Airway Management Planned: LMA  Additional Equipment:   Intra-op Plan:   Post-operative Plan: Extubation in OR  Informed Consent: I have reviewed the patients History and Physical, chart, labs and discussed the procedure including the risks, benefits and alternatives for the proposed anesthesia with the patient or authorized representative who has indicated his/her understanding and acceptance.     Dental advisory given  Plan Discussed with: CRNA and Surgeon  Anesthesia Plan Comments:         Anesthesia Quick Evaluation

## 2019-08-07 LAB — SURGICAL PATHOLOGY

## 2019-08-08 DIAGNOSIS — R2689 Other abnormalities of gait and mobility: Secondary | ICD-10-CM | POA: Diagnosis not present

## 2019-08-08 DIAGNOSIS — R531 Weakness: Secondary | ICD-10-CM | POA: Diagnosis not present

## 2019-08-08 DIAGNOSIS — Z7409 Other reduced mobility: Secondary | ICD-10-CM | POA: Diagnosis not present

## 2019-08-08 DIAGNOSIS — G629 Polyneuropathy, unspecified: Secondary | ICD-10-CM | POA: Diagnosis not present

## 2019-08-08 DIAGNOSIS — R5381 Other malaise: Secondary | ICD-10-CM | POA: Diagnosis not present

## 2019-08-10 ENCOUNTER — Telehealth: Payer: Self-pay | Admitting: Internal Medicine

## 2019-08-10 DIAGNOSIS — G629 Polyneuropathy, unspecified: Secondary | ICD-10-CM | POA: Diagnosis not present

## 2019-08-10 DIAGNOSIS — R531 Weakness: Secondary | ICD-10-CM | POA: Diagnosis not present

## 2019-08-10 DIAGNOSIS — Z7409 Other reduced mobility: Secondary | ICD-10-CM | POA: Diagnosis not present

## 2019-08-10 DIAGNOSIS — R2689 Other abnormalities of gait and mobility: Secondary | ICD-10-CM | POA: Diagnosis not present

## 2019-08-10 DIAGNOSIS — R5381 Other malaise: Secondary | ICD-10-CM | POA: Diagnosis not present

## 2019-08-10 NOTE — Telephone Encounter (Signed)
New Message  Pt's wife called and stated that he currently goes to [facility] for therapy for afib patients. Was told there is nothing left that they could do for the patient. Wants to know if [name] knows any therapy places for afib. Wants to get a call from the nurse for options on what to do.  Please call to discuss

## 2019-08-10 NOTE — Telephone Encounter (Signed)
Patient had surgery last week. Will move up follow up appointment to bring in for rate/rhythm assessment.

## 2019-08-10 NOTE — Telephone Encounter (Signed)
Returned call to patient and wife concerning message per Mining engineer. He has been going to PT with [facility]. He reports his AFib has been acting up. His AFib acts up with activity - HR up, O2 down. PT with [facility] is not comfortable continuing therapy until AFib is under control. Once he rests for a bit, vital signs normalize per patient.    Patient states that PT with [facility] told them there are other therapy clinics that are better equipped to handle patients with AFib. Patient does not want to stop PT so he is asking for recommendations for other programs(?)  Patient is scheduled to see Butch Penny NP 5/24 to discuss antiarrhythmic/cardioversion

## 2019-08-10 NOTE — Telephone Encounter (Signed)
No recommendations from [name] for PT programs.   Advised will send message to Butch Penny NP & Marzetta Board RN as patient has f/u in about 1 month

## 2019-08-14 NOTE — Progress Notes (Signed)
Pharmacist Chemotherapy Monitoring - Follow Up Assessment    I verify that I have reviewed each item in the below checklist:  . Regimen for the patient is scheduled for the appropriate day and plan matches scheduled date. Marland Kitchen Appropriate non-routine labs are ordered dependent on drug ordered. . If applicable, additional medications reviewed and ordered per protocol based on lifetime cumulative doses and/or treatment regimen.   Plan for follow-up and/or issues identified: No . I-vent associated with next due treatment: No . MD and/or nursing notified: No  [name] Rose, [name] Rose 08/14/2019 7:55 AM

## 2019-08-15 ENCOUNTER — Ambulatory Visit (HOSPITAL_COMMUNITY)
Admission: RE | Admit: 2019-08-15 | Discharge: 2019-08-15 | Disposition: A | Discharge status: Home / Self Care | Payer: PPO | Source: Ambulatory Visit | Attending: Nurse Practitioner | Admitting: Nurse Practitioner

## 2019-08-15 ENCOUNTER — Other Ambulatory Visit (HOSPITAL_COMMUNITY): Payer: Self-pay | Admitting: *Deleted

## 2019-08-15 ENCOUNTER — Encounter (HOSPITAL_COMMUNITY): Payer: Self-pay | Admitting: Nurse Practitioner

## 2019-08-15 ENCOUNTER — Telehealth: Payer: Self-pay | Admitting: *Deleted

## 2019-08-15 ENCOUNTER — Telehealth: Payer: Self-pay | Admitting: Pharmacist

## 2019-08-15 ENCOUNTER — Other Ambulatory Visit: Payer: Self-pay

## 2019-08-15 VITALS — BP 106/64 | HR 92 | Ht 74.0 in | Wt 273.6 lb

## 2019-08-15 DIAGNOSIS — I129 Hypertensive chronic kidney disease with stage 1 through stage 4 chronic kidney disease, or unspecified chronic kidney disease: Secondary | ICD-10-CM | POA: Diagnosis not present

## 2019-08-15 DIAGNOSIS — Z87891 Personal history of nicotine dependence: Secondary | ICD-10-CM | POA: Diagnosis not present

## 2019-08-15 DIAGNOSIS — N183 Chronic kidney disease, stage 3 unspecified: Secondary | ICD-10-CM | POA: Diagnosis not present

## 2019-08-15 DIAGNOSIS — D649 Anemia, unspecified: Secondary | ICD-10-CM | POA: Diagnosis not present

## 2019-08-15 DIAGNOSIS — Z8249 Family history of ischemic heart disease and other diseases of the circulatory system: Secondary | ICD-10-CM | POA: Diagnosis not present

## 2019-08-15 DIAGNOSIS — Z7901 Long term (current) use of anticoagulants: Secondary | ICD-10-CM | POA: Insufficient documentation

## 2019-08-15 DIAGNOSIS — C679 Malignant neoplasm of bladder, unspecified: Secondary | ICD-10-CM | POA: Insufficient documentation

## 2019-08-15 DIAGNOSIS — D6869 Other thrombophilia: Secondary | ICD-10-CM | POA: Diagnosis not present

## 2019-08-15 DIAGNOSIS — Z79899 Other long term (current) drug therapy: Secondary | ICD-10-CM | POA: Insufficient documentation

## 2019-08-15 DIAGNOSIS — I4819 Other persistent atrial fibrillation: Secondary | ICD-10-CM

## 2019-08-15 DIAGNOSIS — R7303 Prediabetes: Secondary | ICD-10-CM | POA: Insufficient documentation

## 2019-08-15 LAB — BASIC METABOLIC PANEL
Anion gap: 11 (ref 5–15)
BUN: 27 mg/dL — ABNORMAL HIGH (ref 8–23)
CO2: 24 mmol/L (ref 22–32)
Calcium: 8.4 mg/dL — ABNORMAL LOW (ref 8.9–10.3)
Chloride: 98 mmol/L (ref 98–111)
Creatinine, Ser: 2.32 mg/dL — ABNORMAL HIGH (ref 0.61–1.24)
GFR calc Af Amer: 32 mL/min — ABNORMAL LOW (ref >60)
GFR calc non Af Amer: 28 mL/min — ABNORMAL LOW (ref >60)
Glucose, Bld: 162 mg/dL — ABNORMAL HIGH (ref 70–99)
Potassium: 4 mmol/L (ref 3.5–5.1)
Sodium: 133 mmol/L — ABNORMAL LOW (ref 135–145)

## 2019-08-15 LAB — MAGNESIUM: Magnesium: 1.4 mg/dL — ABNORMAL LOW (ref 1.7–2.4)

## 2019-08-15 LAB — CBC
HCT: 24 % — ABNORMAL LOW (ref 39.0–52.0)
Hemoglobin: 8 g/dL — ABNORMAL LOW (ref 13.0–17.0)
MCH: 33.6 pg (ref 26.0–34.0)
MCHC: 33.3 g/dL (ref 30.0–36.0)
MCV: 100.8 fL — ABNORMAL HIGH (ref 80.0–100.0)
Platelets: UNDETERMINED 10*3/uL (ref 150–400)
RBC: 2.38 MIL/uL — ABNORMAL LOW (ref 4.22–5.81)
RDW: 16.2 % — ABNORMAL HIGH (ref 11.5–15.5)
WBC: 6.1 10*3/uL (ref 4.0–10.5)
nRBC: 0 % (ref 0.0–0.2)

## 2019-08-15 MED ORDER — MAGNESIUM OXIDE -MG SUPPLEMENT 200 MG PO TABS: 200.0000 mg | ORAL_TABLET | Freq: Every day | ORAL | 0 refills | Status: DC

## 2019-08-15 MED ORDER — MAGNESIUM OXIDE 400 MG PO TABS: 400.0000 mg | ORAL_TABLET | Freq: Two times a day (BID) | ORAL | 3 refills | Status: DC

## 2019-08-15 NOTE — Telephone Encounter (Signed)
Patient was in the atrial fibrillation clinic today when noted that Hgb was 8.  [name] was notified.  Wants patient to be T and C and transfused 2 u PRBC.  Appts made.

## 2019-08-15 NOTE — Progress Notes (Signed)
Primary Care Physician: [name] Frees, [name] Rose Referring Physician: Dr. Debara Rose Cardiologist: Dr. Debara Rose  Oncologist : Dr. Marin Rose Urologist: Dr. Lilli Rose is a 69 y.o. 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 Debara Rose as pt had recent cardioversion with ERAF. In his note he suggested use of flecainide and recent stress test was low risk on review of Dr. Debara Rose.   The pt does c/o of a lot of fatigue but has other issues as well contributing. He is rate controlled. He is on eliquis 5 mg bid for a CHA2DS2VASc Score of 2. Heis pending a bladder resection on 4/26. His last creatinine was 2.11.  Pt had pending resection of bladder pending 4/26. His BB was increased prior to surgery to have better rate control. It was not an appropriate time to try to restore SR as I am sure chemical or electrical cardioversion would have been needed and that meant he would have to delay surgery as he would not be able to come off anticoagulation x 4 weeks. He would not have been able to use flecainide as it interfered with 4 of his medicines.   He returns today, 08/15/19 as physical therapy found his HR increased too much with activity and he  paused therapy. He is rate controlled today at 92 mbp. His BP is soft at 106/64, unable to increase rate control for this finding. He started back on anticoagulation right after his bladder resection 4/26 but had to stop it this  past Monday as he had increase hematuria. Hematuria  is improved off anticoagulation. He is c/o of being short of breath with exertion and weak, more so since his procedure. Marland Kitchen He feels terrible. He rolled out of bed last pm hitting his left eye on the bed stand and scraping his  rt knee. He was aware of his surroundings when he hit the floor. No  LOC.   Today, he denies symptoms of palpitations, chest pain, shortness of breath, orthopnea, PND,  lower extremity edema, dizziness, presyncope, syncope, or neurologic sequela.++ fatigue. The patient is tolerating medications without difficulties and is otherwise without complaint today.   Past Medical History:  Diagnosis Date  . Anemia   . Arthritis   . 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 ([place]) 09/29/2016  . Bladder tumor   . Chronic kidney disease    ckd stage 3, sees dr every 6 months, arf hemodialysis done x 2 2020  . Dyspnea    with exertion  . Dysrhythmia    atrial fib  . Essential hypertension 06/23/2018  . Goals of care, counseling/discussion 09/30/2016  . History of prostate cancer followed by pcp dr [name] Rose-  per pt last PSA undetectable   dx 2008-- (Stage T1c, Gleason 3+3,  PSA 4.58, vol 99cc)  s/p  radical prostatectomy (nerve sparing bilateral)   . Hyperglycemia 06/23/2018  . Hypertension   . Mild hyperlipidemia 06/23/2018  . Pre-diabetes   . Wears glasses    Past Surgical History:  Procedure Laterality Date  . CARDIOVERSION N/A 05/18/2019   Procedure: CARDIOVERSION;  Surgeon: [name] Perla, [name] Rose;  Location: [facility];  Service: Cardiovascular;  Laterality: N/A;  . CATARACT EXTRACTION W/ INTRAOCULAR LENS  IMPLANT, BILATERAL Bilateral 2011  . [place]  . IR FLUORO GUIDE PORT INSERTION RIGHT  10/07/2016  . IR RADIOLOGIST EVAL & MGMT  01/05/2018  .  IR US GUIDE VASC ACCESS RIGHT  10/07/2016  . KNEE ARTHROSCOPY Bilateral right 2006;  left 02-15-2007  . [place];  1990;  1983  . ROBOT ASSISTED LAPAROSCOPIC RADICAL PROSTATECTOMY  06/20/2006   bilateral nerve sparing  . TEE WITHOUT CARDIOVERSION N/A 06/27/2018   Procedure: TRANSESOPHAGEAL ECHOCARDIOGRAM (TEE);  Surgeon: [name] Mormon, [name] Rose;  Location[facility] ENDOSCOPY;  Service: Cardiovascular;  Laterality: N/A;  . TOTAL HIP ARTHROPLASTY Left 03/03/2015   Procedure: LEFT TOTAL HIP ARTHROPLASTY ANTERIOR APPROACH;  Surgeon: Dorna Leitz, [name] Rose;  Location: [place];   Service: Orthopedics;  Laterality: Left;  . TOTAL KNEE ARTHROPLASTY Bilateral left 08-13-2009;  right 12-26-2009  . TRANSURETHRAL RESECTION OF BLADDER TUMOR N/A 09/20/2016   Procedure: TRANSURETHRAL RESECTION OF BLADDER TUMOR (TURBT);  Surgeon: [name] Gallo, [name] Rose;  Location: [facility];  Service: Urology;  Laterality: N/A;  . TRANSURETHRAL RESECTION OF BLADDER TUMOR WITH MITOMYCIN-C N/A 08/06/2019   Procedure: TRANSURETHRAL RESECTION OF BLADDER TUMOR;  Surgeon: [name] Gallo, [name] Rose;  Location: [facility];  Service: Urology;  Laterality: N/A;    Current Outpatient Medications  Medication Sig Dispense Refill  . atorvastatin (LIPITOR) 40 MG tablet Take 40 mg by mouth every evening.     Marland Kitchen dexamethasone (DECADRON) 4 MG tablet Take 2 tablets (8 mg total) by mouth daily. Start the day after chemotherapy for 2 days. Take with food. 30 tablet 1  . diltiazem (CARDIZEM CD) 360 MG 24 hr capsule TAKE 1 CAPSULE BY MOUTH EVERY DAY 90 capsule 0  . gabapentin (NEURONTIN) 400 MG capsule TAKE 1 CAPSULE (400 MG TOTAL) BY MOUTH 4 (FOUR) TIMES DAILY. (Patient taking differently: Take 400-800 mg by mouth See admin instructions. Taking 2 capsules by mouth in the am and 2 capsules in the pm) 360 capsule 2  . LORazepam (ATIVAN) 0.5 MG tablet Take 1 tablet (0.5 mg total) by mouth every 6 (six) hours as needed (Nausea or vomiting). 30 tablet 0  . magnesium oxide (MAG-OX) 400 MG tablet Take 400 mg by mouth daily.     . metoprolol succinate (TOPROL-XL) 25 MG 24 hr tablet Take 2 tablets (50 mg total) by mouth in the morning and at bedtime.    . mirabegron ER (MYRBETRIQ) 50 MG TB24 tablet Take 50 mg by mouth daily.    . ondansetron (ZOFRAN) 8 MG tablet Take 1 tablet (8 mg total) by mouth 2 (two) times daily as needed for refractory nausea / vomiting. Start 2 days after chemotherapy. 30 tablet 1  . Potassium Chloride ER 20 MEQ TBCR Take 20 mEq by mouth daily with breakfast.     .  prochlorperazine (COMPAZINE) 10 MG tablet Take 1 tablet (10 mg total) by mouth every 6 (six) hours as needed (Nausea or vomiting). 30 tablet 1  . pyridoxine (B-6) 100 MG tablet Take 200 mg by mouth daily with breakfast.     . traMADol (ULTRAM) 50 MG tablet Take 50 mg by mouth as needed (pain.).     Marland Kitchen VITAMIN D PO Take 1 tablet by mouth daily.    Marland Kitchen apixaban (ELIQUIS) 5 MG TABS tablet Take 1 tablet (5 mg total) by mouth 2 (two) times daily. (Patient not taking: Reported on 08/15/2019) 60 tablet 6   No current facility-administered medications for this encounter.   Facility-Administered Medications Ordered in Other Encounters  Medication Dose Route Frequency Provider Last Rate Last Admin  . sodium chloride flush (NS) 0.9 % injection 10 mL  10 mL Intravenous PRN  [place], Holli Humbles, [name] Rose   10 mL at 02/21/17 1038  . sodium chloride flush (NS) 0.9 % injection 10 mL  10 mL Intravenous PRN Volanda Napoleon, [name] Rose   10 mL at 07/10/18 0844    No Known Allergies  Social History   Socioeconomic History  . Marital status: Married    Spouse name: Not on file  . Number of children: Not on file  . Years of education: Not on file  . Highest education level: Not on file  Occupational History  . Not on file  Tobacco Use  . Smoking status: Former Smoker    Packs/day: 1.00    Years: 16.00    Pack years: 16.00    Types: Cigarettes    Quit date: 09/25/1984    Years since quitting: 34.9  . Smokeless tobacco: Never Used  Substance and Sexual Activity  . Alcohol use: Yes    Comment: occasionally  . Drug use: Yes    Types: Marijuana    Comment: after chemo to help sleep  . Sexual activity: Not on file  Other Topics Concern  . Not on file  Social History Narrative  . Not on file   Social Determinants of Health   Financial Resource Strain:   . Difficulty of Paying Living Expenses:   Food Insecurity:   . Worried About Charity fundraiser in the Last Year:   . Arboriculturist in the Last Year:     Transportation Needs:   . Film/video editor (Medical):   Marland Kitchen Lack of Transportation (Non-Medical):   Physical Activity:   . Days of Exercise per Week:   . Minutes of Exercise per Session:   Stress:   . Feeling of Stress :   Social Connections:   . Frequency of Communication with Friends and Family:   . Frequency of Social Gatherings with Friends and Family:   . Attends Religious Services:   . Active Member of Clubs or Organizations:   . Attends Archivist Meetings:   Marland Kitchen Marital Status:   Intimate Partner Violence:   . Fear of Current or Ex-Partner:   . Emotionally Abused:   Marland Kitchen Physically Abused:   . Sexually Abused:     Family History  Problem Relation Age of Onset  . Hypertension Mother   . Aneurysm Mother   . Emphysema Father   . Hypertension Father     ROS- All systems are reviewed and negative except as per the HPI above  Physical Exam: Vitals:   08/15/19 1026  BP: 106/64  Pulse: 92  SpO2: 92%  Weight: 124.1 kg  Height: $Remove'6\' 2"'ucmnMgG$  (1.88 m)   Wt Readings from Last 3 Encounters:  08/15/19 124.1 kg  08/06/19 127.6 kg  07/30/19 126.6 kg    Labs: Lab Results  Component Value Date   NA 133 (L) 08/15/2019   K 4.0 08/15/2019   CL 98 08/15/2019   CO2 24 08/15/2019   GLUCOSE 162 (H) 08/15/2019   BUN 27 (H) 08/15/2019   CREATININE 2.32 (H) 08/15/2019   CALCIUM 8.4 (L) 08/15/2019   PHOS 3.2 10/14/2018   MG 1.4 (L) 08/15/2019   Lab Results  Component Value Date   INR 1.1 09/25/2018   Lab Results  Component Value Date   CHOL 159 10/05/2018   HDL 47 10/05/2018   LDLCALC 86 10/05/2018   TRIG 131 10/05/2018     GEN- The patient is well appearing, alert and oriented x 3 today.  Head- normocephalic, atraumatic Eyes-  Sclera clear, conjunctiva pink Ears- hearing intact Oropharynx- clear Neck- supple, no JVP Lymph- no cervical lymphadenopathy Lungs- Clear to ausculation bilaterally, normal work of breathing Heart- iregular rate and rhythm, no  murmurs, rubs or gallops, PMI not laterally displaced GI- soft, NT, ND, + BS Extremities- no clubbing, cyanosis, or edema MS- no significant deformity or atrophy Skin- no rash or lesion Psych- euthymic mood, full affect Neuro- strength and sensation are intact  EKG-afib at 92 bpm, qrs int 92 ms, qtc 417  ms Epic records reviewed  Echo-1. The left ventricle has normal systolic function, with an ejection  fraction of 55-60%. The cavity size was normal. Left ventricular diastolic  Doppler parameters are consistent with impaired relaxation.  2. The right ventricle has normal systolic function. The cavity was  mildly enlarged. There is no increase in right ventricular wall thickness.  3. Right atrial size was mildly dilated.  4. Moderate thickening of the aortic valve Mild calcification of the  aortic valve.  5. There is dilatation of the aortic root measuring 39 mm.  6. No vegetations detected.   Assessment and Plan: 1.  Persistent  Afib Recent successful cardioversion but ERAF Dr, [name] Rose was contemplating use of flecainide however, this drug interacts with 4 of his current drugs, fluconazole, ondansetron, prochlorperazine, and myrbetriq Also, he was pending bladder surgery 4/26. If restoring SR was to be done when I saw him 4/2,   this would have  delayed his surgery as he would not be able to stop his anticoagulation He is now back after resection 5/5, for c/o of weakness and shortness of breath worse  since his surgery. He is back off anticoagulation as 5/3 for worsening hematuria.  He is rate controlled at rest but I am sure with exertion his HR climbs However, I cannot increase rate control as his BP is soft at 106/64 I cannot try to restore SR as he is off anticoagulation for hemturia   2. CHA2DS2VASc score of 2 Continue eliquis 5 mg bid is on hold for hematuria  3. Bladder CA Per urology and oncology   CBC this am shows a drop of hemoglobin to 8.0 from 9.9, 9 days ago  and creatinine is also showing a recent increase form 2.00 to 2.32. Pt made aware. Mag low at 1.4 and I asked him to start OTC daily supplement of 200 mg . I forwarded labs to Dr. Diona Fanti and Dr. Marin Rose and also spoke to Dr. Alan Ripper nurse   I can see him back to continue plans for restoring SR once he has been back on anticoagulation x 3 weeks      [name] Rose, [facility] [address] [place], [place] [address] [phone]

## 2019-08-15 NOTE — Telephone Encounter (Signed)
Medication list reviewed in anticipation of upcoming Tikosyn initiation. Patient will need to limit Zofran use as it is QTc prolonging. He will also need to stop prochlorperazine as this is contraindicated with Tikosyn.  Patient is anticoagulated on Eliquis $RemoveBe'5mg'[id number]$  BID on the appropriate dose. This is currently on hold due to hematuria. When he resumes Eliquis, he will need to complete 3 weeks of uninterrupted anticoagulation prior to Tikosyn start.  Patient will need to be counseled to avoid use of Benadryl while on Tikosyn and in the 2-3 days prior to Tikosyn initiation.

## 2019-08-16 ENCOUNTER — Inpatient Hospital Stay: Payer: PPO | Attending: Hematology & Oncology

## 2019-08-16 ENCOUNTER — Inpatient Hospital Stay: Payer: PPO

## 2019-08-16 ENCOUNTER — Other Ambulatory Visit: Payer: Self-pay | Admitting: Family

## 2019-08-16 ENCOUNTER — Other Ambulatory Visit: Payer: Self-pay | Admitting: *Deleted

## 2019-08-16 VITALS — BP 107/68 | HR 87 | Temp 98.7°F | Resp 18

## 2019-08-16 DIAGNOSIS — K5780 Diverticulitis of intestine, part unspecified, with perforation and abscess without bleeding: Secondary | ICD-10-CM | POA: Diagnosis not present

## 2019-08-16 DIAGNOSIS — C772 Secondary and unspecified malignant neoplasm of intra-abdominal lymph nodes: Secondary | ICD-10-CM

## 2019-08-16 DIAGNOSIS — K5900 Constipation, unspecified: Secondary | ICD-10-CM | POA: Diagnosis not present

## 2019-08-16 DIAGNOSIS — C679 Malignant neoplasm of bladder, unspecified: Secondary | ICD-10-CM

## 2019-08-16 DIAGNOSIS — D649 Anemia, unspecified: Secondary | ICD-10-CM

## 2019-08-16 DIAGNOSIS — Z7901 Long term (current) use of anticoagulants: Secondary | ICD-10-CM | POA: Insufficient documentation

## 2019-08-16 DIAGNOSIS — G629 Polyneuropathy, unspecified: Secondary | ICD-10-CM | POA: Diagnosis not present

## 2019-08-16 DIAGNOSIS — Z79899 Other long term (current) drug therapy: Secondary | ICD-10-CM | POA: Diagnosis not present

## 2019-08-16 DIAGNOSIS — Z95828 Presence of other vascular implants and grafts: Secondary | ICD-10-CM

## 2019-08-16 DIAGNOSIS — I4891 Unspecified atrial fibrillation: Secondary | ICD-10-CM | POA: Diagnosis not present

## 2019-08-16 DIAGNOSIS — D509 Iron deficiency anemia, unspecified: Secondary | ICD-10-CM | POA: Insufficient documentation

## 2019-08-16 LAB — CMP (CANCER CENTER ONLY)
ALT: 42 U/L (ref 0–44)
AST: 39 U/L (ref 15–41)
Albumin: 3.6 g/dL (ref 3.5–5.0)
Alkaline Phosphatase: 70 U/L (ref 38–126)
Anion gap: 12 (ref 5–15)
BUN: 30 mg/dL — ABNORMAL HIGH (ref 8–23)
CO2: 25 mmol/L (ref 22–32)
Calcium: 8.7 mg/dL — ABNORMAL LOW (ref 8.9–10.3)
Chloride: 96 mmol/L — ABNORMAL LOW (ref 98–111)
Creatinine: 2.39 mg/dL — ABNORMAL HIGH (ref 0.61–1.24)
GFR, Est AFR Am: 31 mL/min — ABNORMAL LOW (ref >60)
GFR, Estimated: 27 mL/min — ABNORMAL LOW (ref >60)
Glucose, Bld: 173 mg/dL — ABNORMAL HIGH (ref 70–99)
Potassium: 3.8 mmol/L (ref 3.5–5.1)
Sodium: 133 mmol/L — ABNORMAL LOW (ref 135–145)
Total Bilirubin: 0.7 mg/dL (ref 0.3–1.2)
Total Protein: 5.6 g/dL — ABNORMAL LOW (ref 6.5–8.1)

## 2019-08-16 LAB — CBC WITH DIFFERENTIAL (CANCER CENTER ONLY)
Abs Immature Granulocytes: 0.13 10*3/uL — ABNORMAL HIGH (ref 0.00–0.07)
Basophils Absolute: 0 10*3/uL (ref 0.0–0.1)
Basophils Relative: 1 %
Eosinophils Absolute: 0 10*3/uL (ref 0.0–0.5)
Eosinophils Relative: 1 %
HCT: 22.7 % — ABNORMAL LOW (ref 39.0–52.0)
Hemoglobin: 7.5 g/dL — ABNORMAL LOW (ref 13.0–17.0)
Immature Granulocytes: 2 %
Lymphocytes Relative: 7 %
Lymphs Abs: 0.4 10*3/uL — ABNORMAL LOW (ref 0.7–4.0)
MCH: 32.8 pg (ref 26.0–34.0)
MCHC: 33 g/dL (ref 30.0–36.0)
MCV: 99.1 fL (ref 80.0–100.0)
Monocytes Absolute: 0.8 10*3/uL (ref 0.1–1.0)
Monocytes Relative: 14 %
Neutro Abs: 4.7 10*3/uL (ref 1.7–7.7)
Neutrophils Relative %: 75 %
Platelet Count: 36 10*3/uL — ABNORMAL LOW (ref 150–400)
RBC: 2.29 MIL/uL — ABNORMAL LOW (ref 4.22–5.81)
RDW: 16.9 % — ABNORMAL HIGH (ref 11.5–15.5)
WBC Count: 6.2 10*3/uL (ref 4.0–10.5)
nRBC: 0 % (ref 0.0–0.2)

## 2019-08-16 LAB — PREPARE RBC (CROSSMATCH)

## 2019-08-16 MED ORDER — SODIUM CHLORIDE 0.9% FLUSH
10.0000 mL | INTRAVENOUS | Status: DC | PRN
  Administered 2019-08-16: 10 mL via INTRAVENOUS
  Filled 2019-08-16: qty 10

## 2019-08-16 MED ORDER — HEPARIN SOD (PORK) LOCK FLUSH 100 UNIT/ML IV SOLN
500.0000 [IU] | Freq: Once | INTRAVENOUS | Status: AC
  Administered 2019-08-16: 500 [IU] via INTRAVENOUS
  Filled 2019-08-16: qty 5

## 2019-08-16 NOTE — Progress Notes (Unsigned)
Pharmacist Chemotherapy Monitoring - Follow Up Assessment    I verify that I have reviewed each item in the below checklist:  . Regimen for the patient is scheduled for the appropriate day and plan matches scheduled date. Marland Kitchen Appropriate non-routine labs are ordered dependent on drug ordered. . If applicable, additional medications reviewed and ordered per protocol based on lifetime cumulative doses and/or treatment regimen.   Plan for follow-up and/or issues identified: No . I-vent associated with next due treatment: No . MD and/or nursing notified: No  Redina Zeller, [name] 08/16/2019 7:55 AM

## 2019-08-17 ENCOUNTER — Other Ambulatory Visit: Payer: Self-pay

## 2019-08-17 ENCOUNTER — Inpatient Hospital Stay: Payer: PPO

## 2019-08-17 DIAGNOSIS — C679 Malignant neoplasm of bladder, unspecified: Secondary | ICD-10-CM

## 2019-08-17 DIAGNOSIS — D649 Anemia, unspecified: Secondary | ICD-10-CM

## 2019-08-17 DIAGNOSIS — C772 Secondary and unspecified malignant neoplasm of intra-abdominal lymph nodes: Secondary | ICD-10-CM

## 2019-08-17 MED ORDER — DIPHENHYDRAMINE HCL 25 MG PO CAPS
ORAL_CAPSULE | ORAL | Status: AC
  Filled 2019-08-17: qty 1

## 2019-08-17 MED ORDER — ACETAMINOPHEN 325 MG PO TABS
650.0000 mg | ORAL_TABLET | Freq: Once | ORAL | Status: AC
  Administered 2019-08-17: 650 mg via ORAL

## 2019-08-17 MED ORDER — DIPHENHYDRAMINE HCL 25 MG PO CAPS
25.0000 mg | ORAL_CAPSULE | Freq: Once | ORAL | Status: AC
  Administered 2019-08-17: 25 mg via ORAL

## 2019-08-17 MED ORDER — ACETAMINOPHEN 325 MG PO TABS
ORAL_TABLET | ORAL | Status: AC
  Filled 2019-08-17: qty 2

## 2019-08-17 MED ORDER — SODIUM CHLORIDE 0.9% FLUSH
10.0000 mL | INTRAVENOUS | Status: AC | PRN
  Administered 2019-08-17: 10 mL
  Filled 2019-08-17: qty 10

## 2019-08-17 MED ORDER — HEPARIN SOD (PORK) LOCK FLUSH 100 UNIT/ML IV SOLN
500.0000 [IU] | Freq: Every day | INTRAVENOUS | Status: AC | PRN
  Administered 2019-08-17: 500 [IU]
  Filled 2019-08-17: qty 5

## 2019-08-17 NOTE — Patient Instructions (Signed)

## 2019-08-18 LAB — BPAM RBC
Blood Product Expiration Date: 202106032359
Blood Product Expiration Date: 202106032359
ISSUE DATE / TIME: 202105070802
ISSUE DATE / TIME: 202105070802
Unit Type and Rh: 6200
Unit Type and Rh: 6200

## 2019-08-18 LAB — TYPE AND SCREEN
ABO/RH(D): A POS
Antibody Screen: NEGATIVE
Unit division: 0
Unit division: 0

## 2019-08-20 ENCOUNTER — Other Ambulatory Visit: Payer: Self-pay

## 2019-08-20 ENCOUNTER — Encounter (HOSPITAL_COMMUNITY)
Admission: RE | Admit: 2019-08-20 | Discharge: 2019-08-20 | Disposition: A | Discharge status: Home / Self Care | Payer: PPO | Source: Ambulatory Visit | Attending: Family | Admitting: Family

## 2019-08-20 ENCOUNTER — Telehealth: Payer: Self-pay | Admitting: *Deleted

## 2019-08-20 DIAGNOSIS — C772 Secondary and unspecified malignant neoplasm of intra-abdominal lymph nodes: Secondary | ICD-10-CM | POA: Insufficient documentation

## 2019-08-20 DIAGNOSIS — C678 Malignant neoplasm of overlapping sites of bladder: Secondary | ICD-10-CM | POA: Diagnosis not present

## 2019-08-20 DIAGNOSIS — C679 Malignant neoplasm of bladder, unspecified: Secondary | ICD-10-CM | POA: Insufficient documentation

## 2019-08-20 DIAGNOSIS — K5730 Diverticulosis of large intestine without perforation or abscess without bleeding: Secondary | ICD-10-CM | POA: Diagnosis not present

## 2019-08-20 DIAGNOSIS — R310 Gross hematuria: Secondary | ICD-10-CM | POA: Diagnosis not present

## 2019-08-20 DIAGNOSIS — N3281 Overactive bladder: Secondary | ICD-10-CM | POA: Diagnosis not present

## 2019-08-20 LAB — GLUCOSE, CAPILLARY: Glucose-Capillary: 134 mg/dL — ABNORMAL HIGH (ref 70–99)

## 2019-08-20 MED ORDER — FLUDEOXYGLUCOSE F - 18 (FDG) INJECTION
13.7000 | Freq: Once | INTRAVENOUS | Status: AC | PRN
  Administered 2019-08-20: 13.7 via INTRAVENOUS

## 2019-08-20 NOTE — Telephone Encounter (Addendum)
-----   Message from Volanda Napoleon, MD sent at 08/20/2019 10:11 AM EDT ----- Called patient to let him know the PET scan does not show any active cancer!!  This is great news!!

## 2019-08-21 ENCOUNTER — Inpatient Hospital Stay: Payer: PPO

## 2019-08-21 ENCOUNTER — Other Ambulatory Visit (HOSPITAL_COMMUNITY): Payer: Self-pay | Admitting: *Deleted

## 2019-08-21 ENCOUNTER — Other Ambulatory Visit: Payer: Self-pay

## 2019-08-21 ENCOUNTER — Inpatient Hospital Stay (HOSPITAL_BASED_OUTPATIENT_CLINIC_OR_DEPARTMENT_OTHER): Payer: PPO | Admitting: Hematology & Oncology

## 2019-08-21 ENCOUNTER — Telehealth: Payer: Self-pay | Admitting: Hematology & Oncology

## 2019-08-21 VITALS — BP 104/71 | HR 96 | Temp 97.1°F | Resp 18

## 2019-08-21 DIAGNOSIS — D500 Iron deficiency anemia secondary to blood loss (chronic): Secondary | ICD-10-CM

## 2019-08-21 DIAGNOSIS — C679 Malignant neoplasm of bladder, unspecified: Secondary | ICD-10-CM

## 2019-08-21 DIAGNOSIS — D649 Anemia, unspecified: Secondary | ICD-10-CM

## 2019-08-21 DIAGNOSIS — C772 Secondary and unspecified malignant neoplasm of intra-abdominal lymph nodes: Secondary | ICD-10-CM

## 2019-08-21 DIAGNOSIS — Z95828 Presence of other vascular implants and grafts: Secondary | ICD-10-CM

## 2019-08-21 DIAGNOSIS — D508 Other iron deficiency anemias: Secondary | ICD-10-CM

## 2019-08-21 LAB — CBC WITH DIFFERENTIAL (CANCER CENTER ONLY)
Abs Immature Granulocytes: 0.49 10*3/uL — ABNORMAL HIGH (ref 0.00–0.07)
Basophils Absolute: 0.1 10*3/uL (ref 0.0–0.1)
Basophils Relative: 1 %
Eosinophils Absolute: 0.2 10*3/uL (ref 0.0–0.5)
Eosinophils Relative: 2 %
HCT: 28 % — ABNORMAL LOW (ref 39.0–52.0)
Hemoglobin: 9 g/dL — ABNORMAL LOW (ref 13.0–17.0)
Immature Granulocytes: 6 %
Lymphocytes Relative: 6 %
Lymphs Abs: 0.5 10*3/uL — ABNORMAL LOW (ref 0.7–4.0)
MCH: 30.7 pg (ref 26.0–34.0)
MCHC: 32.1 g/dL (ref 30.0–36.0)
MCV: 95.6 fL (ref 80.0–100.0)
Monocytes Absolute: 1.8 10*3/uL — ABNORMAL HIGH (ref 0.1–1.0)
Monocytes Relative: 21 %
Neutro Abs: 5.8 10*3/uL (ref 1.7–7.7)
Neutrophils Relative %: 64 %
Platelet Count: 109 10*3/uL — ABNORMAL LOW (ref 150–400)
RBC: 2.93 MIL/uL — ABNORMAL LOW (ref 4.22–5.81)
RDW: 18 % — ABNORMAL HIGH (ref 11.5–15.5)
WBC Count: 8.8 10*3/uL (ref 4.0–10.5)
nRBC: 0.2 % (ref 0.0–0.2)

## 2019-08-21 LAB — RETICULOCYTES
Immature Retic Fract: 26.2 % — ABNORMAL HIGH (ref 2.3–15.9)
RBC.: 2.94 MIL/uL — ABNORMAL LOW (ref 4.22–5.81)
Retic Count, Absolute: 91.4 10*3/uL (ref 19.0–186.0)
Retic Ct Pct: 3.1 % (ref 0.4–3.1)

## 2019-08-21 LAB — CMP (CANCER CENTER ONLY)
ALT: 58 U/L — ABNORMAL HIGH (ref 0–44)
AST: 58 U/L — ABNORMAL HIGH (ref 15–41)
Albumin: 3.3 g/dL — ABNORMAL LOW (ref 3.5–5.0)
Alkaline Phosphatase: 56 U/L (ref 38–126)
Anion gap: 9 (ref 5–15)
BUN: 25 mg/dL — ABNORMAL HIGH (ref 8–23)
CO2: 28 mmol/L (ref 22–32)
Calcium: 8.5 mg/dL — ABNORMAL LOW (ref 8.9–10.3)
Chloride: 97 mmol/L — ABNORMAL LOW (ref 98–111)
Creatinine: 2.19 mg/dL — ABNORMAL HIGH (ref 0.61–1.24)
GFR, Est AFR Am: 35 mL/min — ABNORMAL LOW (ref >60)
GFR, Estimated: 30 mL/min — ABNORMAL LOW (ref >60)
Glucose, Bld: 183 mg/dL — ABNORMAL HIGH (ref 70–99)
Potassium: 3.6 mmol/L (ref 3.5–5.1)
Sodium: 134 mmol/L — ABNORMAL LOW (ref 135–145)
Total Bilirubin: 0.8 mg/dL (ref 0.3–1.2)
Total Protein: 5.2 g/dL — ABNORMAL LOW (ref 6.5–8.1)

## 2019-08-21 LAB — IRON AND TIBC
Iron: 33 ug/dL — ABNORMAL LOW (ref 42–163)
Saturation Ratios: 18 % — ABNORMAL LOW (ref 20–55)
TIBC: 180 ug/dL — ABNORMAL LOW (ref 202–409)
UIBC: 147 ug/dL (ref 117–376)

## 2019-08-21 LAB — SAMPLE TO BLOOD BANK

## 2019-08-21 LAB — FERRITIN: Ferritin: 2509 ng/mL — ABNORMAL HIGH (ref 24–336)

## 2019-08-21 MED ORDER — METOPROLOL SUCCINATE ER 50 MG PO TB24: 50.0000 mg | ORAL_TABLET | Freq: Two times a day (BID) | ORAL | 3 refills | Status: DC

## 2019-08-21 MED ORDER — HEPARIN SOD (PORK) LOCK FLUSH 100 UNIT/ML IV SOLN
500.0000 [IU] | Freq: Once | INTRAVENOUS | Status: AC
  Administered 2019-08-21: 500 [IU] via INTRAVENOUS
  Filled 2019-08-21: qty 5

## 2019-08-21 MED ORDER — SODIUM CHLORIDE 0.9% FLUSH
10.0000 mL | INTRAVENOUS | Status: DC | PRN
  Administered 2019-08-21: 10 mL via INTRAVENOUS
  Filled 2019-08-21: qty 10

## 2019-08-21 NOTE — Patient Instructions (Signed)
Implanted Port Insertion, Care After °This sheet gives you information about how to care for yourself after your procedure. Your health care provider may also give you more specific instructions. If you have problems or questions, contact your health care provider. °What can I expect after the procedure? °After the procedure, it is common to have: °· Discomfort at the port insertion site. °· Bruising on the skin over the port. This should improve over 3-4 days. °Follow these instructions at home: °Port care °· After your port is placed, you will get a manufacturer's information card. The card has information about your port. Keep this card with you at all times. °· Take care of the port as told by your health care provider. Ask your health care provider if you or a family member can get training for taking care of the port at home. A home health care nurse may also take care of the port. °· Make sure to remember what type of port you have. °Incision care ° °  ° °· Follow instructions from your health care provider about how to take care of your port insertion site. Make sure you: °? Wash your hands with soap and water before and after you change your bandage (dressing). If soap and water are not available, use hand sanitizer. °? Change your dressing as told by your health care provider. °? Leave stitches (sutures), skin glue, or adhesive strips in place. These skin closures may need to stay in place for 2 weeks or longer. If adhesive strip edges start to loosen and curl up, you may trim the loose edges. Do not remove adhesive strips completely unless your health care provider tells you to do that. °· Check your port insertion site every day for signs of infection. Check for: °? Redness, swelling, or pain. °? Fluid or blood. °? Warmth. °? Pus or a bad smell. °Activity °· Return to your normal activities as told by your health care provider. Ask your health care provider what activities are safe for you. °· Do not  lift anything that is heavier than 10 lb (4.5 kg), or the limit that you are told, until your health care provider says that it is safe. °General instructions °· Take over-the-counter and prescription medicines only as told by your health care provider. °· Do not take baths, swim, or use a hot tub until your health care provider approves. Ask your health care provider if you may take showers. You may only be allowed to take sponge baths. °· Do not drive for 24 hours if you were given a sedative during your procedure. °· Wear a medical alert bracelet in case of an emergency. This will tell any health care providers that you have a port. °· Keep all follow-up visits as told by your health care provider. This is important. °Contact a health care provider if: °· You cannot flush your port with saline as directed, or you cannot draw blood from the port. °· You have a fever or chills. °· You have redness, swelling, or pain around your port insertion site. °· You have fluid or blood coming from your port insertion site. °· Your port insertion site feels warm to the touch. °· You have pus or a bad smell coming from the port insertion site. °Get help right away if: °· You have chest pain or shortness of breath. °· You have bleeding from your port that you cannot control. °Summary °· Take care of the port as told by your health   care provider. Keep the manufacturer's information card with you at all times. °· Change your dressing as told by your health care provider. °· Contact a health care provider if you have a fever or chills or if you have redness, swelling, or pain around your port insertion site. °· Keep all follow-up visits as told by your health care provider. °This information is not intended to replace advice given to you by your health care provider. Make sure you discuss any questions you have with your health care provider. °Document Revised: 10/25/2017 Document Reviewed: 10/25/2017 °Elsevier Patient Education ©  2020 [employer] ° °

## 2019-08-21 NOTE — Telephone Encounter (Signed)
Appointments scheduled calendar printed per 5/11 los

## 2019-08-21 NOTE — Progress Notes (Signed)
Hematology and Oncology Follow Up Visit  [name] Rose [id number] 03-08-1951 69 y.o. 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 Taxotere 80mg /m2 IV q 3 wks - s/p cycle #3 - d/c due to progression M-VAC - s/p cycle#8 -- d/c on 07/10/2018 due to blood counts Padcev -- s/p cycle #1 on 07/17/2018 d/c 08/28/2018  Current Therapy: Ifosfamide/Gemzar -- s/p cycle 5- started 04/16/2019 IV iron as indicated    Interim History:  [name] Rose is here today for follow-up.  Unfortunately, he is not feeling well at all.  We had to transfuse him 2 units of blood last week.  His hemoglobin was 7.5.  He thinks that the atrial fibrillation is a problem.  He still is in atrial fibrillation.  He had to be off Eliquis because of having a cystoscopy and removal of some superficial bladder cancers.  He now is back on Eliquis.  He is postop having another cardioversion in 3-4 weeks.  At least, his PET scan showed that he is in remission.  There is no active metabolic disease on the PET scan.  This is certainly quite encouraging.  There is been no problems with fever.  He has had no leg swelling.  His appetite has been okay.  Has had no nausea or vomiting.  He has had no headache.  I am not going to treat him today.  Currently, his performance status is ECOG 2.    Medications:  Allergies as of 08/21/2019   No Known Allergies     Medication List       Accurate as of Aug 21, 2019  8:07 AM. If you have any questions, ask your nurse or doctor.        apixaban 5 MG Tabs tablet Commonly known as: Eliquis Take 1 tablet (5 mg total) by mouth 2 (two) times daily.   atorvastatin 40 MG tablet Commonly known as: LIPITOR Take 40 mg by mouth every evening.   dexamethasone 4 MG tablet Commonly known as: DECADRON Take 2 tablets  (8 mg total) by mouth daily. Start the day after chemotherapy for 2 days. Take with food.   diltiazem 360 MG 24 hr capsule Commonly known as: CARDIZEM CD TAKE 1 CAPSULE BY MOUTH EVERY DAY   gabapentin 400 MG capsule Commonly known as: NEURONTIN TAKE 1 CAPSULE (400 MG TOTAL) BY MOUTH 4 (FOUR) TIMES DAILY. What changed:   how much to take  when to take this  additional instructions   LORazepam 0.5 MG tablet Commonly known as: Ativan Take 1 tablet (0.5 mg total) by mouth every 6 (six) hours as needed (Nausea or vomiting).   magnesium oxide 400 MG tablet Commonly known as: MAG-OX Take 1 tablet (400 mg total) by mouth 2 (two) times daily.   metoprolol succinate 25 MG 24 hr tablet Commonly known as: TOPROL-XL Take 2 tablets (50 mg total) by mouth in the morning and at bedtime.   Myrbetriq 50 MG Tb24 tablet Generic drug: mirabegron ER Take 50 mg by mouth daily.   ondansetron 8 MG tablet Commonly known as: Zofran Take 1 tablet (8 mg total) by mouth 2 (two) times daily as needed for refractory nausea / vomiting. Start 2 days after chemotherapy.   Potassium Chloride ER 20 MEQ Tbcr Take 20 mEq by mouth daily with breakfast.   prochlorperazine 10 MG tablet Commonly  known as: COMPAZINE Take 1 tablet (10 mg total) by mouth every 6 (six) hours as needed (Nausea or vomiting).   pyridoxine 100 MG tablet Commonly known as: B-6 Take 200 mg by mouth daily with breakfast.   traMADol 50 MG tablet Commonly known as: ULTRAM Take 50 mg by mouth as needed (pain.).   VITAMIN D PO Take 1 tablet by mouth daily.       Allergies: No Known Allergies  Past Medical History, Surgical history, Social history, and Family History were reviewed and updated.  Review of Systems: Review of Systems  Constitutional: Positive for malaise/fatigue.  HENT: Negative.   Eyes: Negative.   Respiratory: Positive for shortness of breath.   Cardiovascular: Positive for palpitations.  Genitourinary:  Positive for hematuria.  Musculoskeletal: Negative.   Skin: Negative.   Neurological: Negative.   Endo/Heme/Allergies: Negative.   Psychiatric/Behavioral: Negative.      Physical Exam:  vitals were not taken for this visit.   Wt Readings from Last 3 Encounters:  08/15/19 273 lb 9.6 oz (124.1 kg)  08/06/19 281 lb 6.4 oz (127.6 kg)  07/30/19 279 lb (126.6 kg)    Physical Exam Vitals reviewed.  HENT:     Head: Normocephalic and atraumatic.  Eyes:     Pupils: Pupils are equal, round, and reactive to light.  Cardiovascular:     Rate and Rhythm: Normal rate and regular rhythm.     Heart sounds: Normal heart sounds.     Comments: Cardiac exam shows irregular rate and irregular rhythm consistent with atrial fibrillation.  The rate is fairly well controlled.  He has no murmurs. Pulmonary:     Effort: Pulmonary effort is normal.     Breath sounds: Normal breath sounds.  Abdominal:     General: Bowel sounds are normal.     Palpations: Abdomen is soft.  Musculoskeletal:        General: No tenderness or deformity. Normal range of motion.     Cervical back: Normal range of motion.  Lymphadenopathy:     Cervical: No cervical adenopathy.  Skin:    General: Skin is warm and dry.     Findings: No erythema or rash.  Neurological:     Mental Status: He is alert and oriented to person, place, and time.  Psychiatric:        Behavior: Behavior normal.        Thought Content: Thought content normal.        Judgment: Judgment normal.      Lab Results  Component Value Date   WBC 6.2 08/16/2019   HGB 7.5 (L) 08/16/2019   HCT 22.7 (L) 08/16/2019   MCV 99.1 08/16/2019   PLT 36 (L) 08/16/2019   Lab Results  Component Value Date   FERRITIN 914 (H) 07/30/2019   IRON 62 07/30/2019   TIBC 240 07/30/2019   UIBC 178 07/30/2019   IRONPCTSAT 26 07/30/2019   Lab Results  Component Value Date   RETICCTPCT 5.3 (H) 07/30/2019   RBC 2.29 (L) 08/16/2019   No results found for:  KPAFRELGTCHN, LAMBDASER, KAPLAMBRATIO No results found for: IGGSERUM, IGA, IGMSERUM No results found for: TOTALPROTELP, ALBUMINELP, A1GS, A2GS, BETS, BETA2SER, GAMS, MSPIKE, SPEI   Chemistry      Component Value Date/Time   NA 133 (L) 08/16/2019 1340   NA 140 05/15/2019 1109   NA 141 02/21/2017 0902   K 3.8 08/16/2019 1340   K 4.1 02/21/2017 0902   CL 96 (L) 08/16/2019 1340  CL 98 02/21/2017 0902   CO2 25 08/16/2019 1340   CO2 31 02/21/2017 0902   BUN 30 (H) 08/16/2019 1340   BUN 36 (H) 05/15/2019 1109   BUN 17 02/21/2017 0902   CREATININE 2.39 (H) 08/16/2019 1340   CREATININE 1.2 02/21/2017 0902      Component Value Date/Time   CALCIUM 8.7 (L) 08/16/2019 1340   CALCIUM 9.2 02/21/2017 0902   ALKPHOS 70 08/16/2019 1340   ALKPHOS 65 02/21/2017 0902   AST 39 08/16/2019 1340   ALT 42 08/16/2019 1340   ALT 18 02/21/2017 0902   BILITOT 0.7 08/16/2019 1340       Impression and Plan: [name] Rose is a very pleasant 69 yo caucasian gentleman with metastatic high grade bladder cancer.   Again, would not have to hold on his chemotherapy.  I still think he is able to take any chemotherapy for the next couple weeks.  We have some "flexibility" because his PET scan looked so good.  I does want him to feel better.  We will have to see what his chemistry studies look like.  Thankfully, his wife is able to come in with him today and she is very dedicated and very helpful with remembering things.   Volanda Napoleon, MD 5/11/20218:07 AM

## 2019-08-22 ENCOUNTER — Telehealth: Payer: Self-pay | Admitting: Medical

## 2019-08-22 ENCOUNTER — Other Ambulatory Visit: Payer: Self-pay | Admitting: Hematology & Oncology

## 2019-08-22 ENCOUNTER — Inpatient Hospital Stay: Payer: PPO

## 2019-08-22 DIAGNOSIS — I480 Paroxysmal atrial fibrillation: Secondary | ICD-10-CM | POA: Diagnosis not present

## 2019-08-22 DIAGNOSIS — Z8546 Personal history of malignant neoplasm of prostate: Secondary | ICD-10-CM | POA: Diagnosis not present

## 2019-08-22 DIAGNOSIS — E7800 Pure hypercholesterolemia, unspecified: Secondary | ICD-10-CM | POA: Diagnosis not present

## 2019-08-22 DIAGNOSIS — I10 Essential (primary) hypertension: Secondary | ICD-10-CM | POA: Diagnosis not present

## 2019-08-22 DIAGNOSIS — N183 Chronic kidney disease, stage 3 unspecified: Secondary | ICD-10-CM | POA: Diagnosis not present

## 2019-08-22 NOTE — Telephone Encounter (Signed)
Called spoke with wife about iron infusion appointment added for 5/14.  She was ok with both date/time per 5/12 result note

## 2019-08-23 ENCOUNTER — Inpatient Hospital Stay: Payer: PPO

## 2019-08-24 ENCOUNTER — Other Ambulatory Visit: Payer: Self-pay

## 2019-08-24 ENCOUNTER — Inpatient Hospital Stay: Payer: PPO

## 2019-08-24 VITALS — BP 110/67 | HR 66 | Temp 97.8°F | Resp 17

## 2019-08-24 DIAGNOSIS — C679 Malignant neoplasm of bladder, unspecified: Secondary | ICD-10-CM | POA: Diagnosis not present

## 2019-08-24 DIAGNOSIS — D508 Other iron deficiency anemias: Secondary | ICD-10-CM

## 2019-08-24 MED ORDER — SODIUM CHLORIDE 0.9 % IV SOLN
Freq: Once | INTRAVENOUS | Status: AC
  Filled 2019-08-24: qty 250

## 2019-08-24 MED ORDER — SODIUM CHLORIDE 0.9 % IV SOLN
510.0000 mg | Freq: Once | INTRAVENOUS | Status: AC
  Administered 2019-08-24: 510 mg via INTRAVENOUS
  Filled 2019-08-24: qty 510

## 2019-08-24 NOTE — Progress Notes (Signed)
Pt declined to stay for the 30 minute post infusion observation period. Pt states he has tolerated this medication prior and has not had any issues. Pt aware to call clinic with any questions or concerns. Pt verbalized understanding and had no further questions.

## 2019-08-28 NOTE — Progress Notes (Signed)
Pharmacist Chemotherapy Monitoring - Follow Up Assessment    I verify that I have reviewed each item in the below checklist:  . Regimen for the patient is scheduled for the appropriate day and plan matches scheduled date. Marland Kitchen Appropriate non-routine labs are ordered dependent on drug ordered. . If applicable, additional medications reviewed and ordered per protocol based on lifetime cumulative doses and/or treatment regimen.   Plan for follow-up and/or issues identified: No . I-vent associated with next due treatment: No . MD and/or nursing notified: No  Acquanetta Belling 08/28/2019 8:04 AM

## 2019-08-29 DIAGNOSIS — M6281 Muscle weakness (generalized): Secondary | ICD-10-CM | POA: Diagnosis not present

## 2019-08-29 NOTE — Progress Notes (Signed)
No prior auth required per patient insurance for [employer] per PA team.  Hardie Pulley, PharmD, BCPS, BCOP

## 2019-08-31 ENCOUNTER — Other Ambulatory Visit: Payer: Self-pay | Admitting: Hematology & Oncology

## 2019-09-03 ENCOUNTER — Ambulatory Visit (HOSPITAL_COMMUNITY): Payer: PPO | Admitting: Nurse Practitioner

## 2019-09-03 DIAGNOSIS — N1832 Chronic kidney disease, stage 3b: Secondary | ICD-10-CM | POA: Diagnosis not present

## 2019-09-04 ENCOUNTER — Inpatient Hospital Stay: Payer: PPO

## 2019-09-04 ENCOUNTER — Inpatient Hospital Stay (HOSPITAL_BASED_OUTPATIENT_CLINIC_OR_DEPARTMENT_OTHER): Payer: PPO | Admitting: Family

## 2019-09-04 ENCOUNTER — Other Ambulatory Visit: Payer: Self-pay

## 2019-09-04 ENCOUNTER — Encounter: Payer: Self-pay | Admitting: Family

## 2019-09-04 VITALS — BP 121/79 | HR 82 | Temp 97.1°F | Resp 18 | Ht 74.0 in | Wt 264.1 lb

## 2019-09-04 DIAGNOSIS — C679 Malignant neoplasm of bladder, unspecified: Secondary | ICD-10-CM

## 2019-09-04 DIAGNOSIS — C772 Secondary and unspecified malignant neoplasm of intra-abdominal lymph nodes: Secondary | ICD-10-CM

## 2019-09-04 DIAGNOSIS — D508 Other iron deficiency anemias: Secondary | ICD-10-CM

## 2019-09-04 DIAGNOSIS — D500 Iron deficiency anemia secondary to blood loss (chronic): Secondary | ICD-10-CM

## 2019-09-04 DIAGNOSIS — D649 Anemia, unspecified: Secondary | ICD-10-CM

## 2019-09-04 LAB — CBC WITH DIFFERENTIAL (CANCER CENTER ONLY)
Abs Immature Granulocytes: 0.11 10*3/uL — ABNORMAL HIGH (ref 0.00–0.07)
Basophils Absolute: 0.1 10*3/uL (ref 0.0–0.1)
Basophils Relative: 1 %
Eosinophils Absolute: 0.3 10*3/uL (ref 0.0–0.5)
Eosinophils Relative: 3 %
HCT: 30.9 % — ABNORMAL LOW (ref 39.0–52.0)
Hemoglobin: 9.8 g/dL — ABNORMAL LOW (ref 13.0–17.0)
Immature Granulocytes: 1 %
Lymphocytes Relative: 12 %
Lymphs Abs: 1.2 10*3/uL (ref 0.7–4.0)
MCH: 30.8 pg (ref 26.0–34.0)
MCHC: 31.7 g/dL (ref 30.0–36.0)
MCV: 97.2 fL (ref 80.0–100.0)
Monocytes Absolute: 1.5 10*3/uL — ABNORMAL HIGH (ref 0.1–1.0)
Monocytes Relative: 15 %
Neutro Abs: 6.9 10*3/uL (ref 1.7–7.7)
Neutrophils Relative %: 68 %
Platelet Count: 189 10*3/uL (ref 150–400)
RBC: 3.18 MIL/uL — ABNORMAL LOW (ref 4.22–5.81)
RDW: 18.1 % — ABNORMAL HIGH (ref 11.5–15.5)
WBC Count: 10.1 10*3/uL (ref 4.0–10.5)
nRBC: 0 % (ref 0.0–0.2)

## 2019-09-04 LAB — CMP (CANCER CENTER ONLY)
ALT: 40 U/L (ref 0–44)
AST: 36 U/L (ref 15–41)
Albumin: 3.6 g/dL (ref 3.5–5.0)
Alkaline Phosphatase: 69 U/L (ref 38–126)
Anion gap: 10 (ref 5–15)
BUN: 34 mg/dL — ABNORMAL HIGH (ref 8–23)
CO2: 26 mmol/L (ref 22–32)
Calcium: 9.2 mg/dL (ref 8.9–10.3)
Chloride: 99 mmol/L (ref 98–111)
Creatinine: 2.43 mg/dL — ABNORMAL HIGH (ref 0.61–1.24)
GFR, Est AFR Am: 31 mL/min — ABNORMAL LOW (ref >60)
GFR, Estimated: 26 mL/min — ABNORMAL LOW (ref >60)
Glucose, Bld: 192 mg/dL — ABNORMAL HIGH (ref 70–99)
Potassium: 4.3 mmol/L (ref 3.5–5.1)
Sodium: 135 mmol/L (ref 135–145)
Total Bilirubin: 0.7 mg/dL (ref 0.3–1.2)
Total Protein: 6.2 g/dL — ABNORMAL LOW (ref 6.5–8.1)

## 2019-09-04 LAB — SAMPLE TO BLOOD BANK

## 2019-09-04 LAB — LACTATE DEHYDROGENASE: LDH: 359 U/L — ABNORMAL HIGH (ref 98–192)

## 2019-09-04 MED ORDER — SODIUM CHLORIDE 0.9 % IV SOLN
510.0000 mg | Freq: Once | INTRAVENOUS | Status: AC
  Administered 2019-09-04: 510 mg via INTRAVENOUS
  Filled 2019-09-04: qty 17

## 2019-09-04 MED ORDER — SODIUM CHLORIDE 0.9 % IV SOLN
Freq: Once | INTRAVENOUS | Status: AC
  Filled 2019-09-04: qty 250

## 2019-09-04 MED ORDER — SODIUM CHLORIDE 0.9% FLUSH
10.0000 mL | INTRAVENOUS | Status: DC | PRN
  Filled 2019-09-04: qty 10

## 2019-09-04 MED ORDER — HEPARIN SOD (PORK) LOCK FLUSH 100 UNIT/ML IV SOLN
500.0000 [IU] | Freq: Once | INTRAVENOUS | Status: DC
  Filled 2019-09-04: qty 5

## 2019-09-04 NOTE — Patient Instructions (Signed)
Anemia  Anemia is a condition in which you do not have enough red blood cells or hemoglobin. Hemoglobin is a substance in red blood cells that carries oxygen. When you do not have enough red blood cells or hemoglobin (are anemic), your body cannot get enough oxygen and your organs may not work properly. As a result, you may feel very tired or have other problems. What are the causes? Common causes of anemia include:  Excessive bleeding. Anemia can be caused by excessive bleeding inside or outside the body, including bleeding from the intestine or from periods in women.  Poor nutrition.  Long-lasting (chronic) kidney, thyroid, and liver disease.  Bone marrow disorders.  Cancer and treatments for cancer.  HIV (human immunodeficiency virus) and AIDS (acquired immunodeficiency syndrome).  Treatments for HIV and AIDS.  Spleen problems.  Blood disorders.  Infections, medicines, and autoimmune disorders that destroy red blood cells. What are the signs or symptoms? Symptoms of this condition include:  Minor weakness.  Dizziness.  Headache.  Feeling heartbeats that are irregular or faster than normal (palpitations).  Shortness of breath, especially with exercise.  Paleness.  Cold sensitivity.  Indigestion.  Nausea.  Difficulty sleeping.  Difficulty concentrating. Symptoms may occur suddenly or develop slowly. If your anemia is mild, you may not have symptoms. How is this diagnosed? This condition is diagnosed based on:  Blood tests.  Your medical history.  A physical exam.  Bone marrow biopsy. Your health care provider may also check your stool (feces) for blood and may do additional testing to look for the cause of your bleeding. You may also have other tests, including:  Imaging tests, such as a CT scan or MRI.  Endoscopy.  Colonoscopy. How is this treated? Treatment for this condition depends on the cause. If you continue to lose a lot of blood, you may  need to be treated at a hospital. Treatment may include:  Taking supplements of iron, vitamin S31, or folic acid.  Taking a hormone medicine (erythropoietin) that can help to stimulate red blood cell growth.  Having a blood transfusion. This may be needed if you lose a lot of blood.  Making changes to your diet.  Having surgery to remove your spleen. Follow these instructions at home:  Take over-the-counter and prescription medicines only as told by your health care provider.  Take supplements only as told by your health care provider.  Follow any diet instructions that you were given.  Keep all follow-up visits as told by your health care provider. This is important. Contact a health care provider if:  You develop new bleeding anywhere in the body. Get help right away if:  You are very weak.  You are short of breath.  You have pain in your abdomen or chest.  You are dizzy or feel faint.  You have trouble concentrating.  You have bloody or black, tarry stools.  You vomit repeatedly or you vomit up blood. Summary  Anemia is a condition in which you do not have enough red blood cells or enough of a substance in your red blood cells that carries oxygen (hemoglobin).  Symptoms may occur suddenly or develop slowly.  If your anemia is mild, you may not have symptoms.  This condition is diagnosed with blood tests as well as a medical history and physical exam. Other tests may be needed.  Treatment for this condition depends on the cause of the anemia. This information is not intended to replace advice given to you by  your health care provider. Make sure you discuss any questions you have with your health care provider. Document Revised: 03/11/2017 Document Reviewed: 04/30/2016 Elsevier Patient Education  [employer].

## 2019-09-04 NOTE — Progress Notes (Signed)
Hematology and Oncology Follow Up Visit  KAMAURI DENARDO [id number] 1950-11-12 69 y.o. 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 Taxotere 80mg /m2 IV q 3 wks - s/p cycle #3 - d/c due to progression M-VAC - s/p cycle#8 -- d/c on 07/10/2018 due to blood counts Padcev -- s/p cycle #1 on 04/06/2020d/c 08/28/2018  Current Therapy: Ifosfamide/Gemzar -- s/p cycle 5- started 04/16/2019 IV iron as indicated   Interim History:  Mr. Petite is here today for follow-up and treatment. He is feeling fatigued, weak and SOB with atrial fib. His rate is irregular but controlled at this time.  He is back on Eliquis and goes to see his cardiologist next week on Tuesday to discuss him having another cardioversion vs. surgical intervention.  No episodes of bleeding to report. No petechiae.  No fever, chills, n/v, cough, rash, dizziness, chest pain, palpitations, abdominal pain or changes in bowel or bladder habits.  He has constipation at times.  The mild swelling in his lower extremities is controlled. Pedal pulses are 2+.  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  No new falls, no syncope.  He has maintained a good appetite and is staying well hydrated. His weight is stable.   ECOG Performance Status: 1 - Symptomatic but completely ambulatory  Medications:  Allergies as of 09/04/2019   No Known Allergies     Medication List       Accurate as of Sep 04, 2019  9:06 AM. If you have any questions, ask your nurse or doctor.        amoxicillin 500 MG capsule Commonly known as: AMOXIL TAKE 4 CAPS (2,000 MG TOTAL) BY MOUTH ONCE FOR 1 DOSE. TAKE 1 HOUR PRIOR TO DENTAL PROCEDURE   apixaban 5 MG Tabs tablet Commonly known as: Eliquis Take 1 tablet (5 mg total) by mouth 2 (two) times daily.   atorvastatin 40 MG  tablet Commonly known as: LIPITOR Take 40 mg by mouth every evening.   dexamethasone 4 MG tablet Commonly known as: DECADRON Take 2 tablets (8 mg total) by mouth daily. Start the day after chemotherapy for 2 days. Take with food.   diltiazem 360 MG 24 hr capsule Commonly known as: CARDIZEM CD TAKE 1 CAPSULE BY MOUTH EVERY DAY   gabapentin 400 MG capsule Commonly known as: NEURONTIN TAKE 1 CAPSULE (400 MG TOTAL) BY MOUTH 4 (FOUR) TIMES DAILY. What changed:   how much to take  when to take this  additional instructions   LORazepam 0.5 MG tablet Commonly known as: Ativan Take 1 tablet (0.5 mg total) by mouth every 6 (six) hours as needed (Nausea or vomiting).   magnesium oxide 400 MG tablet Commonly known as: MAG-OX Take 1 tablet (400 mg total) by mouth 2 (two) times daily.   metoprolol succinate 50 MG 24 hr tablet Commonly known as: TOPROL-XL Take 1 tablet (50 mg total) by mouth in the morning and at bedtime.   Myrbetriq 50 MG Tb24 tablet Generic drug: mirabegron ER Take 50 mg by mouth daily.   ondansetron 8 MG tablet Commonly known as: Zofran Take 1 tablet (8 mg total) by mouth 2 (two) times daily as needed for refractory nausea / vomiting. Start 2 days after chemotherapy.   Potassium Chloride ER 20 MEQ Tbcr Take 20 mEq by mouth daily with breakfast.   prochlorperazine 10 MG  tablet Commonly known as: COMPAZINE Take 1 tablet (10 mg total) by mouth every 6 (six) hours as needed (Nausea or vomiting).   pyridoxine 100 MG tablet Commonly known as: B-6 Take 200 mg by mouth daily with breakfast.   traMADol 50 MG tablet Commonly known as: ULTRAM Take 50 mg by mouth as needed (pain.).   VITAMIN D PO Take 1 tablet by mouth daily.       Allergies: No Known Allergies  Past Medical History, Surgical history, Social history, and Family History were reviewed and updated.  Review of Systems: All other 10 point review of systems is negative.   Physical Exam:   vitals were not taken for this visit.   Wt Readings from Last 3 Encounters:  08/15/19 273 lb 9.6 oz (124.1 kg)  08/06/19 281 lb 6.4 oz (127.6 kg)  07/30/19 279 lb (126.6 kg)    Ocular: Sclerae unicteric, pupils equal, round and reactive to light Ear-nose-throat: Oropharynx clear, dentition fair Lymphatic: No cervical or supraclavicular adenopathy Lungs no rales or rhonchi, good excursion bilaterally Heart irregular rate and rhythm, atrial fib controlled, no murmur appreciated Abd soft, nontender, positive bowel sounds, no liver or spleen tip palpated on exam, no fluid wave  MSK no focal spinal tenderness, no joint edema Neuro: non-focal, well-oriented, appropriate affect Breasts: Deferred   Lab Results  Component Value Date   WBC 10.1 09/04/2019   HGB 9.8 (L) 09/04/2019   HCT 30.9 (L) 09/04/2019   MCV 97.2 09/04/2019   PLT 189 09/04/2019   Lab Results  Component Value Date   FERRITIN 2,509 (H) 08/21/2019   IRON 33 (L) 08/21/2019   TIBC 180 (L) 08/21/2019   UIBC 147 08/21/2019   IRONPCTSAT 18 (L) 08/21/2019   Lab Results  Component Value Date   RETICCTPCT 3.1 08/21/2019   RBC 3.18 (L) 09/04/2019   No results found for: KPAFRELGTCHN, LAMBDASER, KAPLAMBRATIO No results found for: IGGSERUM, IGA, IGMSERUM No results found for: Kathrynn Ducking, MSPIKE, SPEI   Chemistry      Component Value Date/Time   NA 134 (L) 08/21/2019 0822   NA 140 05/15/2019 1109   NA 141 02/21/2017 0902   K 3.6 08/21/2019 0822   K 4.1 02/21/2017 0902   CL 97 (L) 08/21/2019 0822   CL 98 02/21/2017 0902   CO2 28 08/21/2019 0822   CO2 31 02/21/2017 0902   BUN 25 (H) 08/21/2019 0822   BUN 36 (H) 05/15/2019 1109   BUN 17 02/21/2017 0902   CREATININE 2.19 (H) 08/21/2019 0822   CREATININE 1.2 02/21/2017 0902      Component Value Date/Time   CALCIUM 8.5 (L) 08/21/2019 0822   CALCIUM 9.2 02/21/2017 0902   ALKPHOS 56 08/21/2019 0822   ALKPHOS 65  02/21/2017 0902   AST 58 (H) 08/21/2019 0822   ALT 58 (H) 08/21/2019 0822   ALT 18 02/21/2017 0902   BILITOT 0.8 08/21/2019 0822       Impression and Plan: Mr. Ciullo is a very pleasant 69yo caucasian gentleman with metastatic high grade bladder cancer. He is still symptomatic with atrial fib as mentioned above. I spoke with [name] and we will hold treatment today and give IV iron.  We will see him again in 2 weeks. By that time he will have seen cardiology and hopefully have a plan to treat the atrial fib.  He is in agreement and will contact our office with any questions or concerns. We can certainly  see him sooner if needed.   Laverna Peace, NP 5/25/20219:06 AM

## 2019-09-04 NOTE — Patient Instructions (Signed)

## 2019-09-05 ENCOUNTER — Inpatient Hospital Stay: Payer: PPO

## 2019-09-05 LAB — ERYTHROPOIETIN: Erythropoietin: 47.7 m[IU]/mL — ABNORMAL HIGH (ref 2.6–18.5)

## 2019-09-06 ENCOUNTER — Inpatient Hospital Stay: Payer: PPO

## 2019-09-10 ENCOUNTER — Other Ambulatory Visit: Payer: Self-pay | Admitting: Hematology & Oncology

## 2019-09-11 ENCOUNTER — Other Ambulatory Visit: Payer: PPO

## 2019-09-11 ENCOUNTER — Other Ambulatory Visit: Payer: Self-pay

## 2019-09-11 ENCOUNTER — Inpatient Hospital Stay: Payer: PPO

## 2019-09-11 ENCOUNTER — Ambulatory Visit (HOSPITAL_COMMUNITY)
Admission: RE | Admit: 2019-09-11 | Discharge: 2019-09-11 | Disposition: A | Discharge status: Home / Self Care | Payer: PPO | Source: Ambulatory Visit | Attending: Nurse Practitioner | Admitting: Nurse Practitioner

## 2019-09-11 ENCOUNTER — Encounter (HOSPITAL_COMMUNITY): Payer: Self-pay | Admitting: Nurse Practitioner

## 2019-09-11 ENCOUNTER — Ambulatory Visit: Payer: PPO | Admitting: Hematology & Oncology

## 2019-09-11 ENCOUNTER — Ambulatory Visit: Payer: PPO

## 2019-09-11 VITALS — BP 116/66 | HR 73 | Ht 74.0 in | Wt 269.6 lb

## 2019-09-11 DIAGNOSIS — E785 Hyperlipidemia, unspecified: Secondary | ICD-10-CM | POA: Insufficient documentation

## 2019-09-11 DIAGNOSIS — Z8546 Personal history of malignant neoplasm of prostate: Secondary | ICD-10-CM | POA: Diagnosis not present

## 2019-09-11 DIAGNOSIS — Z7901 Long term (current) use of anticoagulants: Secondary | ICD-10-CM | POA: Insufficient documentation

## 2019-09-11 DIAGNOSIS — E559 Vitamin D deficiency, unspecified: Secondary | ICD-10-CM | POA: Diagnosis not present

## 2019-09-11 DIAGNOSIS — N183 Chronic kidney disease, stage 3 unspecified: Secondary | ICD-10-CM | POA: Diagnosis not present

## 2019-09-11 DIAGNOSIS — N1832 Chronic kidney disease, stage 3b: Secondary | ICD-10-CM | POA: Diagnosis not present

## 2019-09-11 DIAGNOSIS — Z96642 Presence of left artificial hip joint: Secondary | ICD-10-CM | POA: Diagnosis not present

## 2019-09-11 DIAGNOSIS — D6869 Other thrombophilia: Secondary | ICD-10-CM

## 2019-09-11 DIAGNOSIS — I4819 Other persistent atrial fibrillation: Secondary | ICD-10-CM | POA: Diagnosis not present

## 2019-09-11 DIAGNOSIS — R7303 Prediabetes: Secondary | ICD-10-CM | POA: Diagnosis not present

## 2019-09-11 DIAGNOSIS — Z79899 Other long term (current) drug therapy: Secondary | ICD-10-CM | POA: Diagnosis not present

## 2019-09-11 DIAGNOSIS — E8770 Fluid overload, unspecified: Secondary | ICD-10-CM | POA: Diagnosis not present

## 2019-09-11 DIAGNOSIS — C679 Malignant neoplasm of bladder, unspecified: Secondary | ICD-10-CM | POA: Diagnosis not present

## 2019-09-11 DIAGNOSIS — I480 Paroxysmal atrial fibrillation: Secondary | ICD-10-CM | POA: Diagnosis not present

## 2019-09-11 DIAGNOSIS — Z87891 Personal history of nicotine dependence: Secondary | ICD-10-CM | POA: Insufficient documentation

## 2019-09-11 DIAGNOSIS — Z792 Long term (current) use of antibiotics: Secondary | ICD-10-CM | POA: Insufficient documentation

## 2019-09-11 DIAGNOSIS — Z8249 Family history of ischemic heart disease and other diseases of the circulatory system: Secondary | ICD-10-CM | POA: Insufficient documentation

## 2019-09-11 DIAGNOSIS — N179 Acute kidney failure, unspecified: Secondary | ICD-10-CM | POA: Diagnosis not present

## 2019-09-11 DIAGNOSIS — Z96653 Presence of artificial knee joint, bilateral: Secondary | ICD-10-CM | POA: Insufficient documentation

## 2019-09-11 DIAGNOSIS — I129 Hypertensive chronic kidney disease with stage 1 through stage 4 chronic kidney disease, or unspecified chronic kidney disease: Secondary | ICD-10-CM | POA: Insufficient documentation

## 2019-09-11 DIAGNOSIS — C772 Secondary and unspecified malignant neoplasm of intra-abdominal lymph nodes: Secondary | ICD-10-CM | POA: Diagnosis not present

## 2019-09-11 DIAGNOSIS — M1990 Unspecified osteoarthritis, unspecified site: Secondary | ICD-10-CM | POA: Insufficient documentation

## 2019-09-11 LAB — CBC
HCT: 32.9 % — ABNORMAL LOW (ref 39.0–52.0)
Hemoglobin: 10.2 g/dL — ABNORMAL LOW (ref 13.0–17.0)
MCH: 31.1 pg (ref 26.0–34.0)
MCHC: 31 g/dL (ref 30.0–36.0)
MCV: 100.3 fL — ABNORMAL HIGH (ref 80.0–100.0)
Platelets: 178 10*3/uL (ref 150–400)
RBC: 3.28 MIL/uL — ABNORMAL LOW (ref 4.22–5.81)
RDW: 18.4 % — ABNORMAL HIGH (ref 11.5–15.5)
WBC: 10.3 10*3/uL (ref 4.0–10.5)
nRBC: 0 % (ref 0.0–0.2)

## 2019-09-11 LAB — COMPREHENSIVE METABOLIC PANEL
ALT: 28 U/L (ref 0–44)
AST: 34 U/L (ref 15–41)
Albumin: 3 g/dL — ABNORMAL LOW (ref 3.5–5.0)
Alkaline Phosphatase: 60 U/L (ref 38–126)
Anion gap: 8 (ref 5–15)
BUN: 29 mg/dL — ABNORMAL HIGH (ref 8–23)
CO2: 27 mmol/L (ref 22–32)
Calcium: 8.9 mg/dL (ref 8.9–10.3)
Chloride: 103 mmol/L (ref 98–111)
Creatinine, Ser: 2.37 mg/dL — ABNORMAL HIGH (ref 0.61–1.24)
GFR calc Af Amer: 31 mL/min — ABNORMAL LOW (ref >60)
GFR calc non Af Amer: 27 mL/min — ABNORMAL LOW (ref >60)
Glucose, Bld: 136 mg/dL — ABNORMAL HIGH (ref 70–99)
Potassium: 4.4 mmol/L (ref 3.5–5.1)
Sodium: 138 mmol/L (ref 135–145)
Total Bilirubin: 0.4 mg/dL (ref 0.3–1.2)
Total Protein: 5.7 g/dL — ABNORMAL LOW (ref 6.5–8.1)

## 2019-09-11 LAB — MAGNESIUM: Magnesium: 2 mg/dL (ref 1.7–2.4)

## 2019-09-11 NOTE — Progress Notes (Signed)
Primary Care Physician: Shirline Frees, MD Referring Physician: [name] Cardiologist: [name]  Oncologist : [name] Urologist: [name] is a 69 y.o. 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 as pt had recent cardioversion with ERAF. In his note he suggested use of flecainide and recent stress test was low risk on review of [name].   The pt does c/o of a lot of fatigue but has other issues as well contributing. He is rate controlled. He is on eliquis 5 mg bid for a CHA2DS2VASc Score of 2. Heis pending a bladder resection on 4/26. His last creatinine was 2.11.  Pt had pending resection of bladder pending 4/26. His BB was increased prior to surgery to have better rate control. It was not an appropriate time to try to restore SR as I am sure chemical or electrical cardioversion would have been needed and that meant he would have to delay surgery as he would not be able to come off anticoagulation x 4 weeks. He would not have been able to use flecainide as it interfered with 4 of his medicines.   He returns today, 08/15/19 as physical therapy found his HR increased too much with activity and he  paused therapy. He is rate controlled today at 92 mbp. His BP is soft at 106/64, unable to increase rate control for this finding. He started back on anticoagulation right after his bladder resection 4/26 but had to stop it the following  Monday as he had increase hematuria. Hematuria  is improved off anticoagulation. He is c/o of being short of breath with exertion and weak, more so since his procedure. Marland Kitchen He feels terrible. He rolled out of bed last pm hitting his left eye on the bed stand and scraping his  rt knee. He was aware of his surroundings when he hit the floor. No  LOC. His hgb was found to be 7.5 and urology arranged for an outpt transfusion.   He is now  returned to afib clinic, 6/1,  to discuss tikosyn admit. Marland Kitchen He is feeling well better since infusion. He has seen minimal hematuria. He continues with fatigue and exertional dyspnea. He chemo was put on hold 2 weeks ago as he felt poorly. He feels it will resume next week. He remains in rate control afib. He has received both covid shots.   Today, he denies symptoms of palpitations, chest pain, shortness of breath, orthopnea, PND, lower extremity edema, dizziness, presyncope, syncope, or neurologic sequela.++ fatigue. The patient is tolerating medications without difficulties and is otherwise without complaint today.   Past Medical History:  Diagnosis Date  . Anemia   . Arthritis   . 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 ([place]) 09/29/2016  . Bladder tumor   . Chronic kidney disease    ckd stage 3, sees dr every 6 months, arf hemodialysis done x 2 2020  . Dyspnea    with exertion  . Dysrhythmia    atrial fib  . Essential hypertension 06/23/2018  . Goals of care, counseling/discussion 09/30/2016  . History of prostate cancer followed by pcp dr [name]-  per pt last PSA undetectable   dx 2008-- (Stage T1c, Gleason 3+3,  PSA 4.58, vol 99cc)  s/p  radical prostatectomy (nerve sparing bilateral)   . Hyperglycemia 06/23/2018  . Hypertension   . Mild hyperlipidemia 06/23/2018  .  Pre-diabetes   . Wears glasses    Past Surgical History:  Procedure Laterality Date  . CARDIOVERSION N/A 05/18/2019   Procedure: CARDIOVERSION;  Surgeon: Lelon Perla, MD;  Location: [place];  Service: Cardiovascular;  Laterality: N/A;  . CATARACT EXTRACTION W/ INTRAOCULAR LENS  IMPLANT, BILATERAL Bilateral 2011  . [place]  . IR FLUORO GUIDE PORT INSERTION RIGHT  10/07/2016  . IR RADIOLOGIST EVAL & MGMT  01/05/2018  . IR US GUIDE VASC ACCESS RIGHT  10/07/2016  . KNEE ARTHROSCOPY Bilateral right 2006;  left 02-15-2007  . [place];  1990;  1983  . ROBOT  ASSISTED LAPAROSCOPIC RADICAL PROSTATECTOMY  06/20/2006   bilateral nerve sparing  . TEE WITHOUT CARDIOVERSION N/A 06/27/2018   Procedure: TRANSESOPHAGEAL ECHOCARDIOGRAM (TEE);  Surgeon: Nigel Mormon, MD;  Location: [facility] ENDOSCOPY;  Service: Cardiovascular;  Laterality: N/A;  . TOTAL HIP ARTHROPLASTY Left 03/03/2015   Procedure: LEFT TOTAL HIP ARTHROPLASTY ANTERIOR APPROACH;  Surgeon: Dorna Leitz, MD;  Location: [place];  Service: Orthopedics;  Laterality: Left;  . TOTAL KNEE ARTHROPLASTY Bilateral left 08-13-2009;  right 12-26-2009  . TRANSURETHRAL RESECTION OF BLADDER TUMOR N/A 09/20/2016   Procedure: TRANSURETHRAL RESECTION OF BLADDER TUMOR (TURBT);  Surgeon: Franchot Gallo, MD;  Location: [facility];  Service: Urology;  Laterality: N/A;  . TRANSURETHRAL RESECTION OF BLADDER TUMOR WITH MITOMYCIN-C N/A 08/06/2019   Procedure: TRANSURETHRAL RESECTION OF BLADDER TUMOR;  Surgeon: Franchot Gallo, MD;  Location: [facility];  Service: Urology;  Laterality: N/A;    Current Outpatient Medications  Medication Sig Dispense Refill  . amoxicillin (AMOXIL) 500 MG capsule TAKE 4 CAPS (2,000 MG TOTAL) BY MOUTH ONCE FOR 1 DOSE. TAKE 1 HOUR PRIOR TO DENTAL PROCEDURE 4 capsule 4  . apixaban (ELIQUIS) 5 MG TABS tablet Take 1 tablet (5 mg total) by mouth 2 (two) times daily. 60 tablet 6  . atorvastatin (LIPITOR) 40 MG tablet Take 40 mg by mouth every evening.     Marland Kitchen dexamethasone (DECADRON) 4 MG tablet Take 2 tablets (8 mg total) by mouth daily. Start the day after chemotherapy for 2 days. Take with food. 30 tablet 1  . diltiazem (CARDIZEM CD) 360 MG 24 hr capsule TAKE 1 CAPSULE BY MOUTH EVERY DAY 90 capsule 0  . gabapentin (NEURONTIN) 400 MG capsule TAKE 1 CAPSULE (400 MG TOTAL) BY MOUTH 4 (FOUR) TIMES DAILY. 360 capsule 2  . LORazepam (ATIVAN) 0.5 MG tablet Take 1 tablet (0.5 mg total) by mouth every 6 (six) hours as needed (Nausea or vomiting). 30 tablet 0  . magnesium  oxide (MAG-OX) 400 MG tablet Take 1 tablet (400 mg total) by mouth 2 (two) times daily. 60 tablet 3  . metoprolol succinate (TOPROL-XL) 50 MG 24 hr tablet Take 1 tablet (50 mg total) by mouth in the morning and at bedtime. 60 tablet 3  . mirabegron ER (MYRBETRIQ) 50 MG TB24 tablet Take 50 mg by mouth daily.    . ondansetron (ZOFRAN) 8 MG tablet Take 1 tablet (8 mg total) by mouth 2 (two) times daily as needed for refractory nausea / vomiting. Start 2 days after chemotherapy. 30 tablet 1  . prochlorperazine (COMPAZINE) 10 MG tablet Take 1 tablet (10 mg total) by mouth every 6 (six) hours as needed (Nausea or vomiting). 30 tablet 1  . pyridoxine (B-6) 100 MG tablet Take 200 mg by mouth daily with breakfast.     . traMADol (ULTRAM) 50 MG tablet  Take 50 mg by mouth as needed (pain.).     Marland Kitchen VITAMIN D PO Take 1 tablet by mouth daily.     No current facility-administered medications for this encounter.   Facility-Administered Medications Ordered in Other Encounters  Medication Dose Route Frequency Provider Last Rate Last Admin  . sodium chloride flush (NS) 0.9 % injection 10 mL  10 mL Intravenous PRN [place], Holli Humbles, NP   10 mL at 02/21/17 1038  . sodium chloride flush (NS) 0.9 % injection 10 mL  10 mL Intravenous PRN Volanda Napoleon, MD   10 mL at 07/10/18 0844    No Known Allergies  Social History   Socioeconomic History  . Marital status: Married    Spouse name: Not on file  . Number of children: Not on file  . Years of education: Not on file  . Highest education level: Not on file  Occupational History  . Not on file  Tobacco Use  . Smoking status: Former Smoker    Packs/day: 1.00    Years: 16.00    Pack years: 16.00    Types: Cigarettes    Quit date: 09/25/1984    Years since quitting: 34.9  . Smokeless tobacco: Never Used  Substance and Sexual Activity  . Alcohol use: Yes    Comment: occasionally  . Drug use: Yes    Types: Marijuana    Comment: after chemo to help sleep    . Sexual activity: Not on file  Other Topics Concern  . Not on file  Social History Narrative  . Not on file   Social Determinants of Health   Financial Resource Strain:   . Difficulty of Paying Living Expenses:   Food Insecurity:   . Worried About Charity fundraiser in the Last Year:   . Arboriculturist in the Last Year:   Transportation Needs:   . Film/video editor (Medical):   Marland Kitchen Lack of Transportation (Non-Medical):   Physical Activity:   . Days of Exercise per Week:   . Minutes of Exercise per Session:   Stress:   . Feeling of Stress :   Social Connections:   . Frequency of Communication with Friends and Family:   . Frequency of Social Gatherings with Friends and Family:   . Attends Religious Services:   . Active Member of Clubs or Organizations:   . Attends Archivist Meetings:   Marland Kitchen Marital Status:   Intimate Partner Violence:   . Fear of Current or Ex-Partner:   . Emotionally Abused:   Marland Kitchen Physically Abused:   . Sexually Abused:     Family History  Problem Relation Age of Onset  . Hypertension Mother   . Aneurysm Mother   . Emphysema Father   . Hypertension Father     ROS- All systems are reviewed and negative except as per the HPI above  Physical Exam: Vitals:   09/11/19 1115  BP: 116/66  Pulse: 73  SpO2: 93%  Weight: 122.3 kg  Height: $Remove'6\' 2"'awnAJIC$  (1.88 m)   Wt Readings from Last 3 Encounters:  09/11/19 122.3 kg  09/04/19 119.8 kg  08/15/19 124.1 kg    Labs: Lab Results  Component Value Date   NA 135 09/04/2019   K 4.3 09/04/2019   CL 99 09/04/2019   CO2 26 09/04/2019   GLUCOSE 192 (H) 09/04/2019   BUN 34 (H) 09/04/2019   CREATININE 2.43 (H) 09/04/2019   CALCIUM 9.2 09/04/2019   PHOS  3.2 10/14/2018   MG 1.4 (L) 08/15/2019   Lab Results  Component Value Date   INR 1.1 09/25/2018   Lab Results  Component Value Date   CHOL 159 10/05/2018   HDL 47 10/05/2018   LDLCALC 86 10/05/2018   TRIG 131 10/05/2018     GEN- The  patient is well appearing, alert and oriented x 3 today.   Head- normocephalic, atraumatic Eyes-  Sclera clear, conjunctiva pink Ears- hearing intact Oropharynx- clear Neck- supple, no JVP Lymph- no cervical lymphadenopathy Lungs- Clear to ausculation bilaterally, normal work of breathing Heart- irregular rate and rhythm, no murmurs, rubs or gallops, PMI not laterally displaced GI- soft, NT, ND, + BS Extremities- no clubbing, cyanosis, or edema MS- no significant deformity or atrophy Skin- no rash or lesion Psych- euthymic mood, full affect Neuro- strength and sensation are intact  EKG-afib at 73 bpm, qrs int 92 ms, qtc 425  ms Epic records reviewed  Echo-1. The left ventricle has normal systolic function, with an ejection  fraction of 55-60%. The cavity size was normal. Left ventricular diastolic  Doppler parameters are consistent with impaired relaxation.  2. The right ventricle has normal systolic function. The cavity was  mildly enlarged. There is no increase in right ventricular wall thickness.  3. Right atrial size was mildly dilated.  4. Moderate thickening of the aortic valve Mild calcification of the  aortic valve.  5. There is dilatation of the aortic root measuring 39 mm.  6. No vegetations detected.   Assessment and Plan: 1.  Persistent  Afib Recent successful cardioversion but ERAF Dr, Debara Pickett was contemplating use of flecainide however, this drug interacts with 4 of his current drugs, fluconazole, ondansetron, prochlorperazine, and myrbetriq Also, he was pending bladder surgery 4/26. If restoring SR was to be done when I saw him 4/2,   this would have  delayed his surgery as he would not be able to stop his anticoagulation He is now back after resection 5/5, for c/o of weakness and shortness of breath worse  since his surgery. He is back off anticoagulation as 5/3 for worsening hematuria, subsequent drop in H/H, transfused and feeling better.  He has now been  back on anticoagulation since 5/11.  Discussion for Tikosyn admit in detail today, he no  longer needs zofran/compazine as these are contraindication with Phyllis Ginger He does not take benadryl products  He will check on the price of the drug  Qt is acceptable He will be pending 3 days of chemo next week therefore will plan on admit June 15th  2. CHA2DS2VASc score of 2 Continue eliquis 5 mg bid, stressed no missed doses   3. Bladder CA Per urology and oncology   Will see back am of 6/15 to arrange for tikosyn admit    Butch Penny C. Carvell Hoeffner, [facility] [address] [place], [place] [address] [phone]

## 2019-09-11 NOTE — Progress Notes (Unsigned)
Pharmacist Chemotherapy Monitoring - Follow Up Assessment    I verify that I have reviewed each item in the below checklist:  . Regimen for the patient is scheduled for the appropriate day and plan matches scheduled date. Marland Kitchen Appropriate non-routine labs are ordered dependent on drug ordered. . If applicable, additional medications reviewed and ordered per protocol based on lifetime cumulative doses and/or treatment regimen.   Plan for follow-up and/or issues identified: Yes . I-vent associated with next due treatment: Yes . MD and/or nursing notified: No  [name] Rose, [name] Rose 09/11/2019 7:58 AM

## 2019-09-12 ENCOUNTER — Ambulatory Visit: Payer: PPO

## 2019-09-12 ENCOUNTER — Other Ambulatory Visit: Payer: Self-pay | Admitting: Nephrology

## 2019-09-12 DIAGNOSIS — N179 Acute kidney failure, unspecified: Secondary | ICD-10-CM

## 2019-09-12 DIAGNOSIS — N1832 Chronic kidney disease, stage 3b: Secondary | ICD-10-CM

## 2019-09-13 ENCOUNTER — Ambulatory Visit: Payer: PPO

## 2019-09-13 ENCOUNTER — Other Ambulatory Visit (HOSPITAL_COMMUNITY): Payer: Self-pay | Admitting: *Deleted

## 2019-09-18 ENCOUNTER — Encounter: Payer: Self-pay | Admitting: Hematology & Oncology

## 2019-09-18 ENCOUNTER — Inpatient Hospital Stay: Payer: PPO

## 2019-09-18 ENCOUNTER — Inpatient Hospital Stay (HOSPITAL_BASED_OUTPATIENT_CLINIC_OR_DEPARTMENT_OTHER): Payer: PPO | Admitting: Hematology & Oncology

## 2019-09-18 ENCOUNTER — Inpatient Hospital Stay: Payer: PPO | Attending: Hematology & Oncology

## 2019-09-18 ENCOUNTER — Other Ambulatory Visit: Payer: Self-pay

## 2019-09-18 VITALS — BP 109/64 | HR 74 | Temp 97.3°F | Resp 18 | Ht 74.0 in | Wt 271.0 lb

## 2019-09-18 DIAGNOSIS — Z5111 Encounter for antineoplastic chemotherapy: Secondary | ICD-10-CM | POA: Insufficient documentation

## 2019-09-18 DIAGNOSIS — Z79899 Other long term (current) drug therapy: Secondary | ICD-10-CM | POA: Insufficient documentation

## 2019-09-18 DIAGNOSIS — C679 Malignant neoplasm of bladder, unspecified: Secondary | ICD-10-CM | POA: Insufficient documentation

## 2019-09-18 DIAGNOSIS — Z7901 Long term (current) use of anticoagulants: Secondary | ICD-10-CM | POA: Diagnosis not present

## 2019-09-18 DIAGNOSIS — I4891 Unspecified atrial fibrillation: Secondary | ICD-10-CM | POA: Insufficient documentation

## 2019-09-18 DIAGNOSIS — Z7952 Long term (current) use of systemic steroids: Secondary | ICD-10-CM | POA: Diagnosis not present

## 2019-09-18 DIAGNOSIS — Z95828 Presence of other vascular implants and grafts: Secondary | ICD-10-CM

## 2019-09-18 DIAGNOSIS — G629 Polyneuropathy, unspecified: Secondary | ICD-10-CM | POA: Diagnosis not present

## 2019-09-18 DIAGNOSIS — D649 Anemia, unspecified: Secondary | ICD-10-CM

## 2019-09-18 DIAGNOSIS — D508 Other iron deficiency anemias: Secondary | ICD-10-CM

## 2019-09-18 DIAGNOSIS — C772 Secondary and unspecified malignant neoplasm of intra-abdominal lymph nodes: Secondary | ICD-10-CM | POA: Diagnosis not present

## 2019-09-18 DIAGNOSIS — K5780 Diverticulitis of intestine, part unspecified, with perforation and abscess without bleeding: Secondary | ICD-10-CM | POA: Diagnosis not present

## 2019-09-18 LAB — CBC WITH DIFFERENTIAL (CANCER CENTER ONLY)
Abs Immature Granulocytes: 0.05 10*3/uL (ref 0.00–0.07)
Basophils Absolute: 0 10*3/uL (ref 0.0–0.1)
Basophils Relative: 0 %
Eosinophils Absolute: 0.4 10*3/uL (ref 0.0–0.5)
Eosinophils Relative: 4 %
HCT: 30.7 % — ABNORMAL LOW (ref 39.0–52.0)
Hemoglobin: 9.7 g/dL — ABNORMAL LOW (ref 13.0–17.0)
Immature Granulocytes: 1 %
Lymphocytes Relative: 16 %
Lymphs Abs: 1.5 10*3/uL (ref 0.7–4.0)
MCH: 31.1 pg (ref 26.0–34.0)
MCHC: 31.6 g/dL (ref 30.0–36.0)
MCV: 98.4 fL (ref 80.0–100.0)
Monocytes Absolute: 0.9 10*3/uL (ref 0.1–1.0)
Monocytes Relative: 10 %
Neutro Abs: 6.3 10*3/uL (ref 1.7–7.7)
Neutrophils Relative %: 69 %
Platelet Count: 133 10*3/uL — ABNORMAL LOW (ref 150–400)
RBC: 3.12 MIL/uL — ABNORMAL LOW (ref 4.22–5.81)
RDW: 17.6 % — ABNORMAL HIGH (ref 11.5–15.5)
WBC Count: 9.1 10*3/uL (ref 4.0–10.5)
nRBC: 0 % (ref 0.0–0.2)

## 2019-09-18 LAB — SAMPLE TO BLOOD BANK

## 2019-09-18 LAB — CMP (CANCER CENTER ONLY)
ALT: 17 U/L (ref 0–44)
AST: 23 U/L (ref 15–41)
Albumin: 3.6 g/dL (ref 3.5–5.0)
Alkaline Phosphatase: 61 U/L (ref 38–126)
Anion gap: 9 (ref 5–15)
BUN: 34 mg/dL — ABNORMAL HIGH (ref 8–23)
CO2: 27 mmol/L (ref 22–32)
Calcium: 8.9 mg/dL (ref 8.9–10.3)
Chloride: 101 mmol/L (ref 98–111)
Creatinine: 2.41 mg/dL — ABNORMAL HIGH (ref 0.61–1.24)
GFR, Est AFR Am: 31 mL/min — ABNORMAL LOW (ref >60)
GFR, Estimated: 27 mL/min — ABNORMAL LOW (ref >60)
Glucose, Bld: 172 mg/dL — ABNORMAL HIGH (ref 70–99)
Potassium: 3.7 mmol/L (ref 3.5–5.1)
Sodium: 137 mmol/L (ref 135–145)
Total Bilirubin: 0.5 mg/dL (ref 0.3–1.2)
Total Protein: 5.8 g/dL — ABNORMAL LOW (ref 6.5–8.1)

## 2019-09-18 LAB — FERRITIN: Ferritin: 1983 ng/mL — ABNORMAL HIGH (ref 24–336)

## 2019-09-18 LAB — LACTATE DEHYDROGENASE: LDH: 244 U/L — ABNORMAL HIGH (ref 98–192)

## 2019-09-18 LAB — IRON AND TIBC
Iron: 60 ug/dL (ref 42–163)
Saturation Ratios: 24 % (ref 20–55)
TIBC: 248 ug/dL (ref 202–409)
UIBC: 188 ug/dL (ref 117–376)

## 2019-09-18 MED ORDER — PALONOSETRON HCL INJECTION 0.25 MG/5ML
INTRAVENOUS | Status: AC
  Filled 2019-09-18: qty 5

## 2019-09-18 MED ORDER — HEPARIN SOD (PORK) LOCK FLUSH 100 UNIT/ML IV SOLN
500.0000 [IU] | Freq: Once | INTRAVENOUS | Status: AC
  Administered 2019-09-18: 500 [IU] via INTRAVENOUS
  Filled 2019-09-18: qty 5

## 2019-09-18 MED ORDER — SODIUM CHLORIDE 0.9% FLUSH
10.0000 mL | INTRAVENOUS | Status: DC | PRN
  Administered 2019-09-18: 10 mL via INTRAVENOUS
  Filled 2019-09-18: qty 10

## 2019-09-18 NOTE — Addendum Note (Signed)
Addended by: Melton Krebs on: 09/18/2019 09:29 AM   Modules accepted: Orders, SmartSet

## 2019-09-18 NOTE — Progress Notes (Signed)
Hematology and Oncology Follow Up Visit  [name] Rose [id number] 1950-07-27 69 y.o. 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 Taxotere 80mg /m2 IV q 3 wks - s/p cycle #3 - d/c due to progression M-VAC - s/p cycle#8 -- d/c on 07/10/2018 due to blood counts Padcev -- s/p cycle #1 on 07/17/2018 d/c 08/28/2018  Current Therapy: Ifosfamide/Gemzar -- s/p cycle #6- started 04/16/2019 IV iron as indicated    Interim History:  [name] Rose is here today for follow-up.  Thankfully, he is finally feeling better.  We have not treated him now for about 6 weeks.  His problem has been the atrial fibrillation.  He will be admitted to the hospital next week to undergo Tikosyn initiation.  Hopefully this will get him back into sinus rhythm and he will feel better.  I think we have the "flexibility" of not having to treat him because his last scans were so good.  He is not hurting.  He does have the neuropathy in his feet.  Does get around okay but does get tired pretty quickly.  He has had no cough.  He has had no problems with increased shortness of breath.  He has had no nausea or vomiting.  There has been no bleeding.  He does have some leg swelling.  Of note, we did check his erythropoietin level when we last saw him in May.  It was 48.  As such, we probably could consider ESA for him if needed be  Overall, I would have to say his performance status is ECOG 1.  Medications:  Allergies as of 09/18/2019   No Known Allergies     Medication List       Accurate as of September 18, 2019  9:19 AM. If you have any questions, ask your nurse or doctor.        amoxicillin 500 MG capsule Commonly known as: AMOXIL TAKE 4 CAPS (2,000 MG TOTAL) BY MOUTH ONCE FOR 1 DOSE. TAKE 1 HOUR PRIOR TO DENTAL PROCEDURE   apixaban 5 MG Tabs  tablet Commonly known as: Eliquis Take 1 tablet (5 mg total) by mouth 2 (two) times daily.   atorvastatin 40 MG tablet Commonly known as: LIPITOR Take 40 mg by mouth every evening.   dexamethasone 4 MG tablet Commonly known as: DECADRON Take 2 tablets (8 mg total) by mouth daily. Start the day after chemotherapy for 2 days. Take with food.   diltiazem 360 MG 24 hr capsule Commonly known as: CARDIZEM CD TAKE 1 CAPSULE BY MOUTH EVERY DAY   gabapentin 400 MG capsule Commonly known as: NEURONTIN TAKE 1 CAPSULE (400 MG TOTAL) BY MOUTH 4 (FOUR) TIMES DAILY.   LORazepam 0.5 MG tablet Commonly known as: Ativan Take 1 tablet (0.5 mg total) by mouth every 6 (six) hours as needed (Nausea or vomiting).   magnesium oxide 400 MG tablet Commonly known as: MAG-OX Take 1 tablet (400 mg total) by mouth 2 (two) times daily.   metoprolol succinate 50 MG 24 hr tablet Commonly known as: TOPROL-XL Take 1 tablet (50 mg total) by mouth in the morning and at bedtime.   Myrbetriq 50 MG Tb24 tablet Generic drug: mirabegron ER Take 50 mg by mouth daily.   ondansetron 8 MG tablet Commonly known as: Zofran Take 1 tablet (8 mg total) by mouth 2 (two) times  daily as needed for refractory nausea / vomiting. Start 2 days after chemotherapy.   prochlorperazine 10 MG tablet Commonly known as: COMPAZINE Take 1 tablet (10 mg total) by mouth every 6 (six) hours as needed (Nausea or vomiting).   pyridoxine 100 MG tablet Commonly known as: B-6 Take 200 mg by mouth daily with breakfast.   traMADol 50 MG tablet Commonly known as: ULTRAM Take 50 mg by mouth as needed (pain.).   VITAMIN D PO Take 1 tablet by mouth daily.       Allergies: No Known Allergies  Past Medical History, Surgical history, Social history, and Family History were reviewed and updated.  Review of Systems: Review of Systems  Constitutional: Positive for malaise/fatigue.  HENT: Negative.   Eyes: Negative.   Respiratory:  Positive for shortness of breath.   Cardiovascular: Positive for palpitations.  Genitourinary: Positive for hematuria.  Musculoskeletal: Negative.   Skin: Negative.   Neurological: Negative.   Endo/Heme/Allergies: Negative.   Psychiatric/Behavioral: Negative.      Physical Exam:  height is $RemoveB'6\' 2"'dDrTqPyV$  (1.88 m) and weight is 271 lb (122.9 kg). His temporal temperature is 97.3 F (36.3 C) (abnormal). His blood pressure is 109/64 and his pulse is 74. His respiration is 18 and oxygen saturation is 98%.   Wt Readings from Last 3 Encounters:  09/18/19 271 lb (122.9 kg)  09/11/19 269 lb 9.6 oz (122.3 kg)  09/04/19 264 lb 1.3 oz (119.8 kg)    Physical Exam Vitals reviewed.  HENT:     Head: Normocephalic and atraumatic.  Eyes:     Pupils: Pupils are equal, round, and reactive to light.  Cardiovascular:     Rate and Rhythm: Normal rate and regular rhythm.     Heart sounds: Normal heart sounds.     Comments: Cardiac exam shows irregular rate and irregular rhythm consistent with atrial fibrillation.  The rate is fairly well controlled.  He has no murmurs. Pulmonary:     Effort: Pulmonary effort is normal.     Breath sounds: Normal breath sounds.  Abdominal:     General: Bowel sounds are normal.     Palpations: Abdomen is soft.  Musculoskeletal:        General: No tenderness or deformity. Normal range of motion.     Cervical back: Normal range of motion.  Lymphadenopathy:     Cervical: No cervical adenopathy.  Skin:    General: Skin is warm and dry.     Findings: No erythema or rash.  Neurological:     Mental Status: He is alert and oriented to person, place, and time.  Psychiatric:        Behavior: Behavior normal.        Thought Content: Thought content normal.        Judgment: Judgment normal.      Lab Results  Component Value Date   WBC 9.1 09/18/2019   HGB 9.7 (L) 09/18/2019   HCT 30.7 (L) 09/18/2019   MCV 98.4 09/18/2019   PLT 133 (L) 09/18/2019   Lab Results    Component Value Date   FERRITIN 2,509 (H) 08/21/2019   IRON 33 (L) 08/21/2019   TIBC 180 (L) 08/21/2019   UIBC 147 08/21/2019   IRONPCTSAT 18 (L) 08/21/2019   Lab Results  Component Value Date   RETICCTPCT 3.1 08/21/2019   RBC 3.12 (L) 09/18/2019   No results found for: KPAFRELGTCHN, LAMBDASER, KAPLAMBRATIO No results found for: IGGSERUM, IGA, IGMSERUM No results found for: TOTALPROTELP, ALBUMINELP,  Nils Flack, SPEI   Chemistry      Component Value Date/Time   NA 137 09/18/2019 0820   NA 140 05/15/2019 1109   NA 141 02/21/2017 0902   K 3.7 09/18/2019 0820   K 4.1 02/21/2017 0902   CL 101 09/18/2019 0820   CL 98 02/21/2017 0902   CO2 27 09/18/2019 0820   CO2 31 02/21/2017 0902   BUN 34 (H) 09/18/2019 0820   BUN 36 (H) 05/15/2019 1109   BUN 17 02/21/2017 0902   CREATININE 2.41 (H) 09/18/2019 0820   CREATININE 1.2 02/21/2017 0902      Component Value Date/Time   CALCIUM 8.9 09/18/2019 0820   CALCIUM 9.2 02/21/2017 0902   ALKPHOS 61 09/18/2019 0820   ALKPHOS 65 02/21/2017 0902   AST 23 09/18/2019 0820   ALT 17 09/18/2019 0820   ALT 18 02/21/2017 0902   BILITOT 0.5 09/18/2019 0820       Impression and Plan: Mr. Bromell is a very pleasant 69 yo caucasian gentleman with metastatic high grade bladder cancer.   I think that controlling the atrial fibrillation and get him back in the sinus rhythm is probably can be more important than treating his cancer right now.  If the atrial fibrillation can get back into sinus rhythm, I think his quality of life will be better.  I would like to hope that his renal function will improve.  I would like to hope that the leg swelling will improve.  I would like to hope that his shortness of breath will improve.  We will plan to get him back in about 2 weeks or so.  We will likely start his treatments again when we see him back.  This is truly been a struggle for Mr. Duck.  He has done everything we  have asked him to do.  I just am hoping that his quality of life is going to be improved.    Volanda Napoleon, MD 6/8/20219:19 AM

## 2019-09-19 ENCOUNTER — Inpatient Hospital Stay: Payer: PPO

## 2019-09-20 ENCOUNTER — Inpatient Hospital Stay: Payer: PPO

## 2019-09-21 ENCOUNTER — Ambulatory Visit
Admission: RE | Admit: 2019-09-21 | Discharge: 2019-09-21 | Disposition: A | Discharge status: Home / Self Care | Payer: PPO | Source: Ambulatory Visit | Attending: Nephrology | Admitting: Nephrology

## 2019-09-21 ENCOUNTER — Other Ambulatory Visit: Payer: Self-pay | Admitting: Internal Medicine

## 2019-09-21 DIAGNOSIS — N1832 Chronic kidney disease, stage 3b: Secondary | ICD-10-CM

## 2019-09-21 DIAGNOSIS — N179 Acute kidney failure, unspecified: Secondary | ICD-10-CM | POA: Diagnosis not present

## 2019-09-22 ENCOUNTER — Other Ambulatory Visit (HOSPITAL_COMMUNITY)
Admission: RE | Admit: 2019-09-22 | Discharge: 2019-09-22 | Disposition: A | Discharge status: Home / Self Care | Payer: PPO | Source: Ambulatory Visit | Attending: Nurse Practitioner | Admitting: Nurse Practitioner

## 2019-09-22 DIAGNOSIS — Z961 Presence of intraocular lens: Secondary | ICD-10-CM | POA: Diagnosis present

## 2019-09-22 DIAGNOSIS — E782 Mixed hyperlipidemia: Secondary | ICD-10-CM | POA: Diagnosis not present

## 2019-09-22 DIAGNOSIS — Z9841 Cataract extraction status, right eye: Secondary | ICD-10-CM | POA: Diagnosis not present

## 2019-09-22 DIAGNOSIS — Z7901 Long term (current) use of anticoagulants: Secondary | ICD-10-CM | POA: Diagnosis not present

## 2019-09-22 DIAGNOSIS — C772 Secondary and unspecified malignant neoplasm of intra-abdominal lymph nodes: Secondary | ICD-10-CM | POA: Diagnosis present

## 2019-09-22 DIAGNOSIS — Z87891 Personal history of nicotine dependence: Secondary | ICD-10-CM | POA: Diagnosis not present

## 2019-09-22 DIAGNOSIS — Z79899 Other long term (current) drug therapy: Secondary | ICD-10-CM | POA: Diagnosis not present

## 2019-09-22 DIAGNOSIS — N183 Chronic kidney disease, stage 3 unspecified: Secondary | ICD-10-CM | POA: Diagnosis not present

## 2019-09-22 DIAGNOSIS — I4891 Unspecified atrial fibrillation: Secondary | ICD-10-CM | POA: Diagnosis not present

## 2019-09-22 DIAGNOSIS — I4819 Other persistent atrial fibrillation: Secondary | ICD-10-CM | POA: Diagnosis present

## 2019-09-22 DIAGNOSIS — Z8546 Personal history of malignant neoplasm of prostate: Secondary | ICD-10-CM | POA: Diagnosis not present

## 2019-09-22 DIAGNOSIS — I482 Chronic atrial fibrillation, unspecified: Secondary | ICD-10-CM | POA: Diagnosis not present

## 2019-09-22 DIAGNOSIS — I129 Hypertensive chronic kidney disease with stage 1 through stage 4 chronic kidney disease, or unspecified chronic kidney disease: Secondary | ICD-10-CM | POA: Diagnosis present

## 2019-09-22 DIAGNOSIS — I493 Ventricular premature depolarization: Secondary | ICD-10-CM | POA: Diagnosis present

## 2019-09-22 DIAGNOSIS — Z20822 Contact with and (suspected) exposure to covid-19: Secondary | ICD-10-CM | POA: Diagnosis present

## 2019-09-22 DIAGNOSIS — R7303 Prediabetes: Secondary | ICD-10-CM | POA: Diagnosis present

## 2019-09-22 DIAGNOSIS — Z9842 Cataract extraction status, left eye: Secondary | ICD-10-CM | POA: Diagnosis not present

## 2019-09-22 DIAGNOSIS — N1832 Chronic kidney disease, stage 3b: Secondary | ICD-10-CM | POA: Diagnosis present

## 2019-09-22 DIAGNOSIS — Z96653 Presence of artificial knee joint, bilateral: Secondary | ICD-10-CM | POA: Diagnosis present

## 2019-09-22 DIAGNOSIS — I10 Essential (primary) hypertension: Secondary | ICD-10-CM | POA: Diagnosis not present

## 2019-09-22 DIAGNOSIS — Z01812 Encounter for preprocedural laboratory examination: Secondary | ICD-10-CM | POA: Insufficient documentation

## 2019-09-22 DIAGNOSIS — C679 Malignant neoplasm of bladder, unspecified: Secondary | ICD-10-CM | POA: Diagnosis present

## 2019-09-22 LAB — SARS CORONAVIRUS 2 (TAT 6-24 HRS): SARS Coronavirus 2: NEGATIVE

## 2019-09-25 ENCOUNTER — Inpatient Hospital Stay (HOSPITAL_COMMUNITY)
Admission: RE | Admit: 2019-09-25 | Discharge: 2019-09-28 | DRG: 309 | Disposition: A | Discharge status: Home / Self Care | Payer: PPO | Attending: Internal Medicine | Admitting: Internal Medicine

## 2019-09-25 ENCOUNTER — Other Ambulatory Visit: Payer: Self-pay

## 2019-09-25 ENCOUNTER — Ambulatory Visit (HOSPITAL_COMMUNITY)
Admission: RE | Admit: 2019-09-25 | Discharge: 2019-09-25 | Disposition: A | Discharge status: Home / Self Care | Payer: PPO | Source: Ambulatory Visit | Attending: Nurse Practitioner | Admitting: Nurse Practitioner

## 2019-09-25 ENCOUNTER — Encounter (HOSPITAL_COMMUNITY): Payer: Self-pay | Admitting: Internal Medicine

## 2019-09-25 VITALS — BP 124/72 | HR 89 | Ht 74.0 in | Wt 272.8 lb

## 2019-09-25 DIAGNOSIS — Z7901 Long term (current) use of anticoagulants: Secondary | ICD-10-CM | POA: Diagnosis not present

## 2019-09-25 DIAGNOSIS — Z79899 Other long term (current) drug therapy: Secondary | ICD-10-CM | POA: Diagnosis not present

## 2019-09-25 DIAGNOSIS — Z96653 Presence of artificial knee joint, bilateral: Secondary | ICD-10-CM | POA: Diagnosis present

## 2019-09-25 DIAGNOSIS — C772 Secondary and unspecified malignant neoplasm of intra-abdominal lymph nodes: Secondary | ICD-10-CM | POA: Diagnosis present

## 2019-09-25 DIAGNOSIS — I482 Chronic atrial fibrillation, unspecified: Secondary | ICD-10-CM | POA: Diagnosis present

## 2019-09-25 DIAGNOSIS — C679 Malignant neoplasm of bladder, unspecified: Secondary | ICD-10-CM | POA: Diagnosis present

## 2019-09-25 DIAGNOSIS — I10 Essential (primary) hypertension: Secondary | ICD-10-CM | POA: Diagnosis not present

## 2019-09-25 DIAGNOSIS — Z9842 Cataract extraction status, left eye: Secondary | ICD-10-CM | POA: Diagnosis not present

## 2019-09-25 DIAGNOSIS — I493 Ventricular premature depolarization: Secondary | ICD-10-CM | POA: Diagnosis present

## 2019-09-25 DIAGNOSIS — Z87891 Personal history of nicotine dependence: Secondary | ICD-10-CM | POA: Diagnosis not present

## 2019-09-25 DIAGNOSIS — I4819 Other persistent atrial fibrillation: Principal | ICD-10-CM | POA: Diagnosis present

## 2019-09-25 DIAGNOSIS — R7303 Prediabetes: Secondary | ICD-10-CM | POA: Diagnosis present

## 2019-09-25 DIAGNOSIS — Z9841 Cataract extraction status, right eye: Secondary | ICD-10-CM

## 2019-09-25 DIAGNOSIS — N1832 Chronic kidney disease, stage 3b: Secondary | ICD-10-CM | POA: Diagnosis present

## 2019-09-25 DIAGNOSIS — Z20822 Contact with and (suspected) exposure to covid-19: Secondary | ICD-10-CM | POA: Diagnosis present

## 2019-09-25 DIAGNOSIS — Z961 Presence of intraocular lens: Secondary | ICD-10-CM | POA: Diagnosis present

## 2019-09-25 DIAGNOSIS — I129 Hypertensive chronic kidney disease with stage 1 through stage 4 chronic kidney disease, or unspecified chronic kidney disease: Secondary | ICD-10-CM | POA: Diagnosis present

## 2019-09-25 DIAGNOSIS — Z8546 Personal history of malignant neoplasm of prostate: Secondary | ICD-10-CM | POA: Diagnosis not present

## 2019-09-25 DIAGNOSIS — D6869 Other thrombophilia: Secondary | ICD-10-CM

## 2019-09-25 LAB — BASIC METABOLIC PANEL
Anion gap: 11 (ref 5–15)
BUN: 25 mg/dL — ABNORMAL HIGH (ref 8–23)
CO2: 25 mmol/L (ref 22–32)
Calcium: 9.1 mg/dL (ref 8.9–10.3)
Chloride: 102 mmol/L (ref 98–111)
Creatinine, Ser: 2.31 mg/dL — ABNORMAL HIGH (ref 0.61–1.24)
GFR calc Af Amer: 32 mL/min — ABNORMAL LOW (ref >60)
GFR calc non Af Amer: 28 mL/min — ABNORMAL LOW (ref >60)
Glucose, Bld: 132 mg/dL — ABNORMAL HIGH (ref 70–99)
Potassium: 4.3 mmol/L (ref 3.5–5.1)
Sodium: 138 mmol/L (ref 135–145)

## 2019-09-25 LAB — HIV ANTIBODY (ROUTINE TESTING W REFLEX): HIV Screen 4th Generation wRfx: NONREACTIVE

## 2019-09-25 LAB — MAGNESIUM: Magnesium: 1.9 mg/dL (ref 1.7–2.4)

## 2019-09-25 MED ORDER — ATORVASTATIN CALCIUM 40 MG PO TABS
40.0000 mg | ORAL_TABLET | Freq: Every evening | ORAL | Status: DC
  Administered 2019-09-25 – 2019-09-27 (×3): 40 mg via ORAL
  Filled 2019-09-25 (×3): qty 1

## 2019-09-25 MED ORDER — TRAMADOL HCL 50 MG PO TABS: 50.0000 mg | ORAL_TABLET | Freq: Four times a day (QID) | ORAL | Status: DC | PRN

## 2019-09-25 MED ORDER — MAGNESIUM OXIDE 400 MG PO TABS: 400.0000 mg | ORAL_TABLET | Freq: Two times a day (BID) | ORAL | Status: DC

## 2019-09-25 MED ORDER — SODIUM CHLORIDE 0.9% FLUSH
3.0000 mL | Freq: Two times a day (BID) | INTRAVENOUS | Status: DC
  Administered 2019-09-26 – 2019-09-27 (×4): 3 mL via INTRAVENOUS

## 2019-09-25 MED ORDER — MAGNESIUM OXIDE 400 (241.3 MG) MG PO TABS
400.0000 mg | ORAL_TABLET | Freq: Two times a day (BID) | ORAL | Status: DC
  Administered 2019-09-26 – 2019-09-28 (×5): 400 mg via ORAL
  Filled 2019-09-25 (×6): qty 1

## 2019-09-25 MED ORDER — METOPROLOL SUCCINATE ER 50 MG PO TB24
50.0000 mg | ORAL_TABLET | Freq: Two times a day (BID) | ORAL | Status: DC
  Administered 2019-09-25 – 2019-09-28 (×5): 50 mg via ORAL
  Filled 2019-09-25 (×6): qty 1

## 2019-09-25 MED ORDER — MAGNESIUM SULFATE 2 GM/50ML IV SOLN
2.0000 g | Freq: Once | INTRAVENOUS | Status: AC
  Administered 2019-09-25: 2 g via INTRAVENOUS
  Filled 2019-09-25: qty 50

## 2019-09-25 MED ORDER — DILTIAZEM HCL ER COATED BEADS 180 MG PO CP24
360.0000 mg | ORAL_CAPSULE | Freq: Every day | ORAL | Status: DC
  Administered 2019-09-26 – 2019-09-28 (×3): 360 mg via ORAL
  Filled 2019-09-25 (×3): qty 2

## 2019-09-25 MED ORDER — SODIUM CHLORIDE 0.9 % IV SOLN: 250.0000 mL | INTRAVENOUS | Status: DC | PRN

## 2019-09-25 MED ORDER — APIXABAN 5 MG PO TABS
5.0000 mg | ORAL_TABLET | Freq: Two times a day (BID) | ORAL | Status: DC
  Administered 2019-09-25 – 2019-09-28 (×6): 5 mg via ORAL
  Filled 2019-09-25 (×6): qty 1

## 2019-09-25 MED ORDER — GABAPENTIN 400 MG PO CAPS
400.0000 mg | ORAL_CAPSULE | Freq: Two times a day (BID) | ORAL | Status: DC
  Administered 2019-09-25 – 2019-09-26 (×3): 400 mg via ORAL
  Filled 2019-09-25 (×3): qty 1

## 2019-09-25 MED ORDER — DOFETILIDE 250 MCG PO CAPS
250.0000 ug | ORAL_CAPSULE | Freq: Two times a day (BID) | ORAL | Status: DC
  Administered 2019-09-25 – 2019-09-28 (×6): 250 ug via ORAL
  Filled 2019-09-25 (×6): qty 1

## 2019-09-25 MED ORDER — SODIUM CHLORIDE 0.9% FLUSH: 3.0000 mL | INTRAVENOUS | Status: DC | PRN

## 2019-09-25 NOTE — Progress Notes (Signed)
Pharmacy: Dofetilide (Tikosyn) - Initial Consult Assessment and Electrolyte Replacement  Pharmacy consulted to assist in monitoring and replacing electrolytes in this 69 y.o. male admitted on 09/25/2019 undergoing dofetilide initiation. First dofetilide dose: 09/25/19.  Assessment:  Patient Exclusion Criteria: If any screening criteria checked as "Yes", then  patient  should NOT receive dofetilide until criteria item is corrected.  If "Yes" please indicate correction plan.  YES  NO Patient  Exclusion Criteria Correction Plan   '[x]'$   '[]'$   Baseline QTc interval is greater than or equal to 440 msec. IF above YES box checked dofetilide contraindicated unless patient has ICD; then may proceed if QTc 500-550 msec or with known ventricular conduction abnormalities may proceed with QTc 550-600 msec. QTc = 479ms Previous qtc 423, will follow closely   '[]'$   '[x]'$   Patient is known or suspected to have a digoxin level greater than 2 ng/ml: No results found for: DIGOXIN     '[]'$   '[x]'$   Creatinine clearance less than 20 ml/min (calculated using Cockcroft-Gault, actual body weight and serum creatinine): Estimated Creatinine Clearance: 41.6 mL/min (A) (by C-G formula based on SCr of 2.31 mg/dL (H)).     '[]'$   '[x]'$  Patient has received drugs known to prolong the QT intervals within the last 48 hours (phenothiazines, tricyclics or tetracyclic antidepressants, erythromycin, H-1 antihistamines, cisapride, fluoroquinolones, azithromycin). Updated information on QT prolonging agents is available to be searched on the following database:QT prolonging agents     '[]'$   '[x]'$   Patient received a dose of hydrochlorothiazide (Oretic) alone or in any combination including triamterene (Dyazide, Maxzide) in the last 48 hours.    '[]'$   '[x]'$  Patient received a medication known to increase dofetilide plasma concentrations prior to initial dofetilide dose:  . Trimethoprim (Primsol, Proloprim) in the last 36 hours . Verapamil  (Calan, Verelan) in the last 36 hours or a sustained release dose in the last 72 hours . Megestrol (Megace) in the last 5 days  . Cimetidine (Tagamet) in the last 6 hours . Ketoconazole (Nizoral) in the last 24 hours . Itraconazole (Sporanox) in the last 48 hours  . Prochlorperazine (Compazine) in the last 36 hours     '[]'$   '[x]'$   Patient is known to have a history of torsades de pointes; congenital or acquired long QT syndromes.    '[]'$   '[x]'$   Patient has received a Class 1 antiarrhythmic with less than 2 half-lives since last dose. (Disopyramide, Quinidine, Procainamide, Lidocaine, Mexiletine, Flecainide, Propafenone)    '[]'$   '[x]'$   Patient has received amiodarone therapy in the past 3 months or amiodarone level is greater than 0.3 ng/ml.    Patient has been appropriately anticoagulated with apixaban.  Labs:    Component Value Date/Time   K 4.3 09/25/2019 1106   K 4.1 02/21/2017 0902   MG 1.9 09/25/2019 1106     Plan: Potassium: K >/= 4: Appropriate to initiate Tikosyn, no replacement needed    Magnesium: Mg 1.8-2: Give Mg 2 gm IV x1 to prevent Mg from dropping below 1.8 - do not need to recheck Mg. Appropriate to initiate Tikosyn   Thank you for allowing pharmacy to participate in this patient's care   Erin Hearing PharmD., BCPS Clinical Pharmacist 09/25/2019 2:51 PM

## 2019-09-25 NOTE — Progress Notes (Signed)
Primary Care Physician: Shirline Frees, MD Referring Physician: [name] Cardiologist: [name]  Oncologist : [name] Urologist: [name] is a 69 y.o. 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 as pt had recent cardioversion with ERAF. In his note he suggested use of flecainide and recent stress test was low risk on review of [name].   The pt does c/o of a lot of fatigue but has other issues as well contributing. He is rate controlled. He is on eliquis 5 mg bid for a CHA2DS2VASc Score of 2. Heis pending a bladder resection on 4/26. His last creatinine was 2.11.  Pt had pending resection of bladder pending 4/26. His BB was increased prior to surgery to have better rate control. It was not an appropriate time to try to restore SR as I am sure chemical or electrical cardioversion would have been needed and that meant he would have to delay surgery as he would not be able to come off anticoagulation x 4 weeks. He would not have been able to use flecainide as it interfered with 4 of his medicines.   He returns today, 08/15/19 as physical therapy found his HR increased too much with activity and he  paused therapy. He is rate controlled today at 92 mbp. His BP is soft at 106/64, unable to increase rate control for this finding. He started back on anticoagulation right after his bladder resection 4/26 but had to stop it the following  Monday as he had increase hematuria. Hematuria  is improved off anticoagulation. He is c/o of being short of breath with exertion and weak, more so since his procedure. Marland Kitchen He feels terrible. He rolled out of bed last pm hitting his left eye on the bed stand and scraping his  rt knee. He was aware of his surroundings when he hit the floor. No  LOC. His hgb was found to be 7.5 and urology arranged for an outpt transfusion.   He is now  returned to afib clinic, 6/1,  to discuss tikosyn admit. Marland Kitchen He is feeling well better since infusion. He has seen minimal hematuria. He continues with fatigue and exertional dyspnea. He chemo was put on hold 2 weeks ago as he felt poorly. He feels it will resume next week. He remains in rate control afib. He has received both covid shots.   F/u in afib clinic for tikosyn admit, 6/15. He was to have his chemo last week but the oncologist said that he would delay until after Tikosyn load. No benadryl or no missed  DOAC for 3 weeks. No significant hematuria.   Today, he denies symptoms of palpitations, chest pain, shortness of breath, orthopnea, PND, lower extremity edema, dizziness, presyncope, syncope, or neurologic sequela.++ fatigue. The patient is tolerating medications without difficulties and is otherwise without complaint today.   Past Medical History:  Diagnosis Date  . Anemia   . Arthritis   . 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 ([place]) 09/29/2016  . Bladder tumor   . Chronic kidney disease    ckd stage 3, sees dr every 6 months, arf hemodialysis done x 2 2020  . Dyspnea    with exertion  . Dysrhythmia    atrial fib  . Essential hypertension 06/23/2018  . Goals of care, counseling/discussion 09/30/2016  . History of prostate cancer followed by pcp dr  harris-  per pt last PSA undetectable   dx 2008-- (Stage T1c, Gleason 3+3,  PSA 4.58, vol 99cc)  s/p  radical prostatectomy (nerve sparing bilateral)   . Hyperglycemia 06/23/2018  . Hypertension   . Mild hyperlipidemia 06/23/2018  . Pre-diabetes   . Wears glasses    Past Surgical History:  Procedure Laterality Date  . CARDIOVERSION N/A 05/18/2019   Procedure: CARDIOVERSION;  Surgeon: Lelon Perla, MD;  Location: [facility];  Service: Cardiovascular;  Laterality: N/A;  . CATARACT EXTRACTION W/ INTRAOCULAR LENS  IMPLANT, BILATERAL Bilateral 2011  . [place]  . IR FLUORO GUIDE PORT INSERTION  RIGHT  10/07/2016  . IR RADIOLOGIST EVAL & MGMT  01/05/2018  . IR US GUIDE VASC ACCESS RIGHT  10/07/2016  . KNEE ARTHROSCOPY Bilateral right 2006;  left 02-15-2007  . [place];  1990;  1983  . ROBOT ASSISTED LAPAROSCOPIC RADICAL PROSTATECTOMY  06/20/2006   bilateral nerve sparing  . TEE WITHOUT CARDIOVERSION N/A 06/27/2018   Procedure: TRANSESOPHAGEAL ECHOCARDIOGRAM (TEE);  Surgeon: Nigel Mormon, MD;  Location: [facility] ENDOSCOPY;  Service: Cardiovascular;  Laterality: N/A;  . TOTAL HIP ARTHROPLASTY Left 03/03/2015   Procedure: LEFT TOTAL HIP ARTHROPLASTY ANTERIOR APPROACH;  Surgeon: Dorna Leitz, MD;  Location: [place] [facility];  Service: Orthopedics;  Laterality: Left;  . TOTAL KNEE ARTHROPLASTY Bilateral left 08-13-2009;  right 12-26-2009  . TRANSURETHRAL RESECTION OF BLADDER TUMOR N/A 09/20/2016   Procedure: TRANSURETHRAL RESECTION OF BLADDER TUMOR (TURBT);  Surgeon: Franchot Gallo, MD;  [facility];  Service: Urology;  Laterality: N/A;  . TRANSURETHRAL RESECTION OF BLADDER TUMOR WITH MITOMYCIN-C N/A 08/06/2019   Procedure: TRANSURETHRAL RESECTION OF BLADDER TUMOR;  Surgeon: Franchot Gallo, MD;  Location: [facility];  Service: Urology;  Laterality: N/A;    Current Outpatient Medications  Medication Sig Dispense Refill  . amoxicillin (AMOXIL) 500 MG capsule TAKE 4 CAPS (2,000 MG TOTAL) BY MOUTH ONCE FOR 1 DOSE. TAKE 1 HOUR PRIOR TO DENTAL PROCEDURE 4 capsule 4  . apixaban (ELIQUIS) 5 MG TABS tablet Take 1 tablet (5 mg total) by mouth 2 (two) times daily. 60 tablet 6  . atorvastatin (LIPITOR) 40 MG tablet Take 40 mg by mouth every evening.     Marland Kitchen dexamethasone (DECADRON) 4 MG tablet Take 2 tablets (8 mg total) by mouth daily. Start the day after chemotherapy for 2 days. Take with food. 30 tablet 1  . diltiazem (CARDIZEM CD) 360 MG 24 hr capsule TAKE 1 CAPSULE BY MOUTH EVERY DAY 90 capsule 1  . gabapentin (NEURONTIN) 400 MG capsule TAKE 1  CAPSULE (400 MG TOTAL) BY MOUTH 4 (FOUR) TIMES DAILY. 360 capsule 2  . LORazepam (ATIVAN) 0.5 MG tablet Take 1 tablet (0.5 mg total) by mouth every 6 (six) hours as needed (Nausea or vomiting). 30 tablet 0  . magnesium oxide (MAG-OX) 400 MG tablet Take 1 tablet (400 mg total) by mouth 2 (two) times daily. 60 tablet 3  . metoprolol succinate (TOPROL-XL) 50 MG 24 hr tablet Take 1 tablet (50 mg total) by mouth in the morning and at bedtime. 60 tablet 3  . ondansetron (ZOFRAN) 8 MG tablet Take 1 tablet (8 mg total) by mouth 2 (two) times daily as needed for refractory nausea / vomiting. Start 2 days after chemotherapy. 30 tablet 1  . prochlorperazine (COMPAZINE) 10 MG tablet Take 1 tablet (10 mg total) by mouth every 6 (six) hours as needed (Nausea or vomiting). [place]  tablet 1  . pyridoxine (B-6) 100 MG tablet Take 200 mg by mouth daily with breakfast.     . traMADol (ULTRAM) 50 MG tablet Take 50 mg by mouth as needed (pain.).     Marland Kitchen VITAMIN D PO Take 1 tablet by mouth daily.    . mirabegron ER (MYRBETRIQ) 50 MG TB24 tablet Take 50 mg by mouth daily. (Patient not taking: Reported on 09/25/2019)     No current facility-administered medications for this encounter.   Facility-Administered Medications Ordered in Other Encounters  Medication Dose Route Frequency Provider Last Rate Last Admin  . sodium chloride flush (NS) 0.9 % injection 10 mL  10 mL Intravenous PRN [place], Holli Humbles, NP   10 mL at 02/21/17 1038  . sodium chloride flush (NS) 0.9 % injection 10 mL  10 mL Intravenous PRN Volanda Napoleon, MD   10 mL at 07/10/18 0844  . sodium chloride flush (NS) 0.9 % injection 10 mL  10 mL Intravenous PRN Eliezer Bottom, NP   10 mL at 09/18/19 7412    No Known Allergies  Social History   Socioeconomic History  . Marital status: Married    Spouse name: Not on file  . Number of children: Not on file  . Years of education: Not on file  . Highest education level: Not on file  Occupational History    . Not on file  Tobacco Use  . Smoking status: Former Smoker    Packs/day: 1.00    Years: 16.00    Pack years: 16.00    Types: Cigarettes    Quit date: 09/25/1984    Years since quitting: 35.0  . Smokeless tobacco: Never Used  Vaping Use  . Vaping Use: Never used  Substance and Sexual Activity  . Alcohol use: Yes    Comment: occasionally  . Drug use: Yes    Types: Marijuana    Comment: after chemo to help sleep  . Sexual activity: Not on file  Other Topics Concern  . Not on file  Social History Narrative  . Not on file   Social Determinants of Health   Financial Resource Strain:   . Difficulty of Paying Living Expenses:   Food Insecurity:   . Worried About Charity fundraiser in the Last Year:   . Arboriculturist in the Last Year:   Transportation Needs:   . Film/video editor (Medical):   Marland Kitchen Lack of Transportation (Non-Medical):   Physical Activity:   . Days of Exercise per Week:   . Minutes of Exercise per Session:   Stress:   . Feeling of Stress :   Social Connections:   . Frequency of Communication with Friends and Family:   . Frequency of Social Gatherings with Friends and Family:   . Attends Religious Services:   . Active Member of Clubs or Organizations:   . Attends Archivist Meetings:   Marland Kitchen Marital Status:   Intimate Partner Violence:   . Fear of Current or Ex-Partner:   . Emotionally Abused:   Marland Kitchen Physically Abused:   . Sexually Abused:     Family History  Problem Relation Age of Onset  . Hypertension Mother   . Aneurysm Mother   . Emphysema Father   . Hypertension Father     ROS- All systems are reviewed and negative except as per the HPI above  Physical Exam: Vitals:   09/25/19 1116  BP: 124/72  Pulse: 89  Weight: 123.7  kg  Height: $Remove'6\' 2"'[id number]$  (1.88 m)   Wt Readings from Last 3 Encounters:  09/25/19 123.7 kg  09/18/19 122.9 kg  09/11/19 122.3 kg    Labs: Lab Results  Component Value Date   NA 137 09/18/2019   K 3.7  09/18/2019   CL 101 09/18/2019   CO2 27 09/18/2019   GLUCOSE 172 (H) 09/18/2019   BUN 34 (H) 09/18/2019   CREATININE 2.41 (H) 09/18/2019   CALCIUM 8.9 09/18/2019   PHOS 3.2 10/14/2018   MG 2.0 09/11/2019   Lab Results  Component Value Date   INR 1.1 09/25/2018   Lab Results  Component Value Date   CHOL 159 10/05/2018   HDL 47 10/05/2018   LDLCALC 86 10/05/2018   TRIG 131 10/05/2018     GEN- The patient is well appearing, alert and oriented x 3 today.   Head- normocephalic, atraumatic Eyes-  Sclera clear, conjunctiva pink Ears- hearing intact Oropharynx- clear Neck- supple, no JVP Lymph- no cervical lymphadenopathy Lungs- Clear to ausculation bilaterally, normal work of breathing Heart- irregular rate and rhythm, no murmurs, rubs or gallops, PMI not laterally displaced GI- soft, NT, ND, + BS Extremities- no clubbing, cyanosis, or edema MS- no significant deformity or atrophy Skin- no rash or lesion Psych- euthymic mood, full affect Neuro- strength and sensation are intact  EKG-afib at 89 bpm, qrs int 92 ms, qtc 450  Ms ( 423 ms on previous ekg)  Epic records reviewed  Echo-1. The left ventricle has normal systolic function, with an ejection  fraction of 55-60%. The cavity size was normal. Left ventricular diastolic  Doppler parameters are consistent with impaired relaxation.  2. The right ventricle has normal systolic function. The cavity was  mildly enlarged. There is no increase in right ventricular wall thickness.  3. Right atrial size was mildly dilated.  4. Moderate thickening of the aortic valve Mild calcification of the  aortic valve.  5. There is dilatation of the aortic root measuring 39 mm.  6. No vegetations detected.   Assessment and Plan: 1.  Persistent  Afib Recent successful cardioversion but ERAF Dr, Debara Pickett was contemplating use of flecainide however, this drug had interaction with some of his drugs  he is now s/p bladder surgery 4/26 from   which he had significant hematuria, required blood transfusion  He has now been back on anticoagulation since 5/11.   drugs screened by PharmD and no qtc prolonging drugs  He does not take benadryl products  Aware of the price of the drug, he is in the do-nut hole and will get drug thru good RX Qt is acceptable  2. CHA2DS2VASc score of 2 Continue eliquis 5 mg bid, stressed no missed doses   3. Bladder CA Per urology and oncology   Labs  today show k+/mag at acceptable levels Crcl cal at 53.41 and  will qualify for the 250 mcg bid  To Joice. Denise Washburn, [facility] [address] [place], [place] [address] [phone]

## 2019-09-25 NOTE — H&P (Signed)
Primary Care Physician: Johny Blamer, MD Referring Physician: Dr. Rennis Golden Cardiologist: Dr. Rennis Golden  Oncologist : Dr. Myna Hidalgo Urologist: Dr. Yetta Numbers is a 69 y.o. 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 Rennis Golden as pt had recent cardioversion with ERAF. In his note he suggested use of flecainide and recent stress test was low risk on review of Dr. Rennis Golden.   The pt does c/o of a lot of fatigue but has other issues as well contributing. He is rate controlled. He is on eliquis 5 mg bid for a CHA2DS2VASc Score of 2. Heis pending a bladder resection on 4/26. His last creatinine was 2.11.  Pt had pending resection of bladder pending 4/26. His BB was increased prior to surgery to have better rate control. It was not an appropriate time to try to restore SR as I am sure chemical or electrical cardioversion would have been needed and that meant he would have to delay surgery as he would not be able to come off anticoagulation x 4 weeks. He would not have been able to use flecainide as it interfered with 4 of his medicines.   He returns today, 08/15/19 as physical therapy found his HR increased too much with activity and he  paused therapy. He is rate controlled today at 92 mbp. His BP is soft at 106/64, unable to increase rate control for this finding. He started back on anticoagulation right after his bladder resection 4/26 but had to stop it the following  Monday as he had increase hematuria. Hematuria  is improved off anticoagulation. He is c/o of being short of breath with exertion and weak, more so since his procedure. Marland Kitchen He feels terrible. He rolled out of bed last pm hitting his left eye on the bed stand and scraping his  rt knee. He was aware of his surroundings when he hit the floor. No  LOC. His hgb was found to be 7.5 and urology arranged for an outpt transfusion.   He is now  returned to afib clinic, 6/1,  to discuss tikosyn admit. Marland Kitchen He is feeling well better since infusion. He has seen minimal hematuria. He continues with fatigue and exertional dyspnea. He chemo was put on hold 2 weeks ago as he felt poorly. He feels it will resume next week. He remains in rate control afib. He has received both covid shots.   F/u in afib clinic for tikosyn admit, 6/15. He was to have his chemo last week but the oncologist said that he would delay until after Tikosyn load. No benadryl or no missed  DOAC for 3 weeks. No significant hematuria.   Today, he denies symptoms of palpitations, chest pain, shortness of breath, orthopnea, PND, lower extremity edema, dizziness, presyncope, syncope, or neurologic sequela.++ fatigue. The patient is tolerating medications without difficulties and is otherwise without complaint today.   Past Medical History:  Diagnosis Date  . Anemia   . Arthritis   . 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 (HCC) 09/29/2016  . Bladder tumor   . Chronic kidney disease    ckd stage 3, sees dr every 6 months, arf hemodialysis done x 2 2020  . Dyspnea    with exertion  . Dysrhythmia    atrial fib  . Essential hypertension 06/23/2018  . Goals of care, counseling/discussion 09/30/2016  . History of prostate cancer followed by pcp dr  harris-  per pt last PSA undetectable   dx 2008-- (Stage T1c, Gleason 3+3,  PSA 4.58, vol 99cc)  s/p  radical prostatectomy (nerve sparing bilateral)   . Hyperglycemia 06/23/2018  . Hypertension   . Mild hyperlipidemia 06/23/2018  . Pre-diabetes   . Wears glasses    Past Surgical History:  Procedure Laterality Date  . CARDIOVERSION N/A 05/18/2019   Procedure: CARDIOVERSION;  Surgeon: Lelon Perla, MD;  Location: [facility];  Service: Cardiovascular;  Laterality: N/A;  . CATARACT EXTRACTION W/ INTRAOCULAR LENS  IMPLANT, BILATERAL Bilateral 2011  . [place]  . IR FLUORO GUIDE PORT INSERTION  RIGHT  10/07/2016  . IR RADIOLOGIST EVAL & MGMT  01/05/2018  . IR US GUIDE VASC ACCESS RIGHT  10/07/2016  . KNEE ARTHROSCOPY Bilateral right 2006;  left 02-15-2007  . [place];  1990;  1983  . ROBOT ASSISTED LAPAROSCOPIC RADICAL PROSTATECTOMY  06/20/2006   bilateral nerve sparing  . TEE WITHOUT CARDIOVERSION N/A 06/27/2018   Procedure: TRANSESOPHAGEAL ECHOCARDIOGRAM (TEE);  Surgeon: Nigel Mormon, MD;  Location[facility] ENDOSCOPY;  Service: Cardiovascular;  Laterality: N/A;  . TOTAL HIP ARTHROPLASTY Left 03/03/2015   Procedure: LEFT TOTAL HIP ARTHROPLASTY ANTERIOR APPROACH;  Surgeon: Dorna Leitz, MD;  Location: [facility];  Service: Orthopedics;  Laterality: Left;  . TOTAL KNEE ARTHROPLASTY Bilateral left 08-13-2009;  right 12-26-2009  . TRANSURETHRAL RESECTION OF BLADDER TUMOR N/A 09/20/2016   Procedure: TRANSURETHRAL RESECTION OF BLADDER TUMOR (TURBT);  Surgeon: Franchot Gallo, MD;  Location[facility];  Service: Urology;  Laterality: N/A;  . TRANSURETHRAL RESECTION OF BLADDER TUMOR WITH MITOMYCIN-C N/A 08/06/2019   Procedure: TRANSURETHRAL RESECTION OF BLADDER TUMOR;  Surgeon: Franchot Gallo, MD;  Location: [facility];  Service: Urology;  Laterality: N/A;    Current Facility-Administered Medications  Medication Dose Route Frequency Provider Last Rate Last Admin  . 0.9 %  sodium chloride infusion  250 mL Intravenous PRN Sherran Needs, NP      . apixaban Arne Cleveland) tablet 5 mg  5 mg Oral BID Sherran Needs, NP   5 mg at 09/25/19 1725  . atorvastatin (LIPITOR) tablet 40 mg  40 mg Oral QPM Sherran Needs, NP   40 mg at 09/25/19 1726  . [START ON 09/26/2019] diltiazem (CARDIZEM CD) 24 hr capsule 360 mg  360 mg Oral Daily Sherran Needs, NP      . dofetilide (TIKOSYN) capsule 250 mcg  250 mcg Oral BID Sherran Needs, NP      . gabapentin (NEURONTIN) capsule 400 mg  400 mg Oral BID Sherran Needs, NP      . Derrill Memo ON 09/26/2019]  magnesium oxide (MAG-OX) tablet 400 mg  400 mg Oral BID Luvinia Lucy, Jeneen Rinks, MD      . metoprolol succinate (TOPROL-XL) 24 hr tablet 50 mg  50 mg Oral BID Sherran Needs, NP   50 mg at 09/25/19 1726  . sodium chloride flush (NS) 0.9 % injection 3 mL  3 mL Intravenous Q12H Roderic Palau C, NP      . sodium chloride flush (NS) 0.9 % injection 3 mL  3 mL Intravenous PRN Sherran Needs, NP      . traMADol Veatrice Bourbon) tablet 50 mg  50 mg Oral Q6H PRN Sherran Needs, NP       Facility-Administered Medications Ordered in Other Encounters  Medication Dose Route Frequency Provider Last Rate Last Admin  . sodium  chloride flush (NS) 0.9 % injection 10 mL  10 mL Intravenous PRN [place], Holli Humbles, NP   10 mL at 02/21/17 1038  . sodium chloride flush (NS) 0.9 % injection 10 mL  10 mL Intravenous PRN Volanda Napoleon, MD   10 mL at 07/10/18 0844  . sodium chloride flush (NS) 0.9 % injection 10 mL  10 mL Intravenous PRN Eliezer Bottom, NP   10 mL at 09/18/19 5638    No Known Allergies  Social History   Socioeconomic History  . Marital status: Married    Spouse name: Not on file  . Number of children: Not on file  . Years of education: Not on file  . Highest education level: Not on file  Occupational History  . Not on file  Tobacco Use  . Smoking status: Former Smoker    Packs/day: 1.00    Years: 16.00    Pack years: 16.00    Types: Cigarettes    Quit date: 09/25/1984    Years since quitting: 35.0  . Smokeless tobacco: Never Used  Vaping Use  . Vaping Use: Never used  Substance and Sexual Activity  . Alcohol use: Yes    Comment: occasionally  . Drug use: Yes    Types: Marijuana    Comment: after chemo to help sleep  . Sexual activity: Not on file  Other Topics Concern  . Not on file  Social History Narrative  . Not on file   Social Determinants of Health   Financial Resource Strain:   . Difficulty of Paying Living Expenses:   Food Insecurity:   . Worried About Sales executive in the Last Year:   . Arboriculturist in the Last Year:   Transportation Needs:   . Film/video editor (Medical):   Marland Kitchen Lack of Transportation (Non-Medical):   Physical Activity:   . Days of Exercise per Week:   . Minutes of Exercise per Session:   Stress:   . Feeling of Stress :   Social Connections:   . Frequency of Communication with Friends and Family:   . Frequency of Social Gatherings with Friends and Family:   . Attends Religious Services:   . Active Member of Clubs or Organizations:   . Attends Archivist Meetings:   Marland Kitchen Marital Status:   Intimate Partner Violence:   . Fear of Current or Ex-Partner:   . Emotionally Abused:   Marland Kitchen Physically Abused:   . Sexually Abused:     Family History  Problem Relation Age of Onset  . Hypertension Mother   . Aneurysm Mother   . Emphysema Father   . Hypertension Father     ROS- All systems are reviewed and negative except as per the HPI above  Physical Exam: Vitals:   09/25/19 1355 09/25/19 1610  BP: 115/73 104/67  Pulse: 82 80  Resp: 18 17  Temp: 98.4 F (36.9 C) 97.8 F (36.6 C)  TempSrc: Oral Oral  SpO2: 96% 100%  Weight: 124.1 kg   Height: 6' (1.829 m)    Wt Readings from Last 3 Encounters:  09/25/19 124.1 kg  09/25/19 123.7 kg  09/18/19 122.9 kg    Labs: Lab Results  Component Value Date   NA 138 09/25/2019   K 4.3 09/25/2019   CL 102 09/25/2019   CO2 25 09/25/2019   GLUCOSE 132 (H) 09/25/2019   BUN 25 (H) 09/25/2019   CREATININE 2.31 (H) 09/25/2019   CALCIUM  9.1 09/25/2019   PHOS 3.2 10/14/2018   MG 1.9 09/25/2019   Lab Results  Component Value Date   INR 1.1 09/25/2018   Lab Results  Component Value Date   CHOL 159 10/05/2018   HDL 47 10/05/2018   LDLCALC 86 10/05/2018   TRIG 131 10/05/2018     GEN- The patient is well appearing, alert and oriented x 3 today.   Head- normocephalic, atraumatic Eyes-  Sclera clear, conjunctiva pink Ears- hearing intact Oropharynx-  clear Neck- supple, no JVP Lymph- no cervical lymphadenopathy Lungs- Clear to ausculation bilaterally, normal work of breathing Heart- irregular rate and rhythm, no murmurs, rubs or gallops, PMI not laterally displaced GI- soft, NT, ND, + BS Extremities- no clubbing, cyanosis, or edema MS- no significant deformity or atrophy Skin- no rash or lesion Psych- euthymic mood, full affect Neuro- strength and sensation are intact  EKG-afib at 89 bpm, qrs int 92 ms, qtc 450  Ms ( 423 ms on previous ekg)  Epic records reviewed  Echo-1. The left ventricle has normal systolic function, with an ejection  fraction of 55-60%. The cavity size was normal. Left ventricular diastolic  Doppler parameters are consistent with impaired relaxation.  2. The right ventricle has normal systolic function. The cavity was  mildly enlarged. There is no increase in right ventricular wall thickness.  3. Right atrial size was mildly dilated.  4. Moderate thickening of the aortic valve Mild calcification of the  aortic valve.  5. There is dilatation of the aortic root measuring 39 mm.  6. No vegetations detected.   Assessment and Plan: 1.  Persistent  Afib Recent successful cardioversion but ERAF Dr, Debara Pickett was contemplating use of flecainide however, this drug had interaction with some of his drugs  he is now s/p bladder surgery 4/26 from  which he had significant hematuria, required blood transfusion  He has now been back on anticoagulation since 5/11.   drugs screened by PharmD and no qtc prolonging drugs  He does not take benadryl products  Aware of the price of the drug, he is in the do-nut hole and will get drug thru good RX Qt is acceptable  2. CHA2DS2VASc score of 2 Continue eliquis 5 mg bid, stressed no missed doses   3. Bladder CA Per urology and oncology   Labs  today show k+/mag at acceptable levels Crcl cal at 53.41 and  will qualify for the 250 mcg bid  To [place]. [place],  [facility] [address] [place], [place] [address] [phone]   I have seen, examined the patient, and reviewed the above assessment and plan.  Changes to above are made where necessary.  On exam, iRRR.  He has frequent PVCs at baseline  (prior to initiation of tikosyn) Reports compliance with anticoagualtion without interruption. We will admit for initiation of tikosyn.  Co Sign: Thompson Grayer, MD 09/25/2019 5:53 PM

## 2019-09-25 NOTE — Care Management (Signed)
Per Izora Ribas. W/[facility]. PH# [phone].  Dofetilide capsule (TIKOSYN) not on formulary.

## 2019-09-25 NOTE — Care Management (Signed)
1434 09-25-19 Patient presented for Tikosyn Load. Benefits check submitted for cost. Case Manager will follow for cost. Graves-Bigelow, Ocie Cornfield, RN, BSN Case Manager

## 2019-09-26 ENCOUNTER — Other Ambulatory Visit: Payer: Self-pay

## 2019-09-26 LAB — PROTIME-INR
INR: 1.3 — ABNORMAL HIGH (ref 0.8–1.2)
Prothrombin Time: 15.9 seconds — ABNORMAL HIGH (ref 11.4–15.2)

## 2019-09-26 LAB — BASIC METABOLIC PANEL
Anion gap: 10 (ref 5–15)
BUN: 24 mg/dL — ABNORMAL HIGH (ref 8–23)
CO2: 26 mmol/L (ref 22–32)
Calcium: 8.6 mg/dL — ABNORMAL LOW (ref 8.9–10.3)
Chloride: 102 mmol/L (ref 98–111)
Creatinine, Ser: 2.36 mg/dL — ABNORMAL HIGH (ref 0.61–1.24)
GFR calc Af Amer: 32 mL/min — ABNORMAL LOW (ref >60)
GFR calc non Af Amer: 27 mL/min — ABNORMAL LOW (ref >60)
Glucose, Bld: 135 mg/dL — ABNORMAL HIGH (ref 70–99)
Potassium: 3.7 mmol/L (ref 3.5–5.1)
Sodium: 138 mmol/L (ref 135–145)

## 2019-09-26 LAB — MAGNESIUM: Magnesium: 1.9 mg/dL (ref 1.7–2.4)

## 2019-09-26 MED ORDER — POTASSIUM CHLORIDE CRYS ER 20 MEQ PO TBCR
20.0000 meq | EXTENDED_RELEASE_TABLET | Freq: Once | ORAL | Status: AC
  Administered 2019-09-26: 20 meq via ORAL
  Filled 2019-09-26: qty 1

## 2019-09-26 MED ORDER — GABAPENTIN 400 MG PO CAPS
400.0000 mg | ORAL_CAPSULE | Freq: Once | ORAL | Status: AC
  Administered 2019-09-26: 400 mg via ORAL
  Filled 2019-09-26: qty 1

## 2019-09-26 MED ORDER — POTASSIUM CHLORIDE CRYS ER 20 MEQ PO TBCR: 40.0000 meq | EXTENDED_RELEASE_TABLET | Freq: Once | ORAL | Status: DC

## 2019-09-26 MED ORDER — POTASSIUM CHLORIDE CRYS ER 20 MEQ PO TBCR
30.0000 meq | EXTENDED_RELEASE_TABLET | Freq: Once | ORAL | Status: AC
  Administered 2019-09-26: 30 meq via ORAL
  Filled 2019-09-26: qty 1

## 2019-09-26 MED ORDER — MAGNESIUM SULFATE 2 GM/50ML IV SOLN
2.0000 g | Freq: Once | INTRAVENOUS | Status: AC
  Administered 2019-09-26: 2 g via INTRAVENOUS
  Filled 2019-09-26: qty 50

## 2019-09-26 MED ORDER — GABAPENTIN 400 MG PO CAPS
800.0000 mg | ORAL_CAPSULE | Freq: Two times a day (BID) | ORAL | Status: DC
  Administered 2019-09-27 – 2019-09-28 (×3): 800 mg via ORAL
  Filled 2019-09-26 (×3): qty 2

## 2019-09-26 MED ORDER — MAGNESIUM SULFATE IN D5W 1-5 GM/100ML-% IV SOLN
1.0000 g | Freq: Once | INTRAVENOUS | Status: DC
  Filled 2019-09-26: qty 100

## 2019-09-26 NOTE — Progress Notes (Addendum)
Progress Note  Patient Name: [name] Rose Date of Encounter: 09/26/2019  [facility] Cardiologist: Pixie Casino, MD   Subjective   No complaints,  AFib takes his energy  Inpatient Medications    Scheduled Meds: . apixaban  5 mg Oral BID  . atorvastatin  40 mg Oral QPM  . diltiazem  360 mg Oral Daily  . dofetilide  250 mcg Oral BID  . gabapentin  400 mg Oral BID  . magnesium oxide  400 mg Oral BID  . metoprolol succinate  50 mg Oral BID  . sodium chloride flush  3 mL Intravenous Q12H   Continuous Infusions: . sodium chloride     PRN Meds: sodium chloride, sodium chloride flush, traMADol   Vital Signs    Vitals:   09/25/19 1355 09/25/19 1610 09/25/19 2009 09/26/19 0442  BP: 115/73 104/67 134/78 108/66  Pulse: 82 80 73 74  Resp: $Remo'18 17 17 16  'JMaoB$ Temp: 98.4 F (36.9 C) 97.8 F (36.6 C) 98.4 F (36.9 C) 98.4 F (36.9 C)  TempSrc: Oral Oral Oral Oral  SpO2: 96% 100% 96% 94%  Weight: 124.1 kg   123.8 kg  Height: 6' (1.829 m)       Intake/Output Summary (Last 24 hours) at 09/26/2019 0623 Last data filed at 09/26/2019 0500 Gross per 24 hour  Intake 580 ml  Output --  Net 580 ml   Last 3 Weights 09/26/2019 09/25/2019 09/25/2019  Weight (lbs) 273 lb 273 lb 9.6 oz 272 lb 12.8 oz  Weight (kg) 123.832 kg 124.104 kg 123.741 kg      Telemetry    AFib.flutter, frequent PVCs, often bigeminy (noted prior to drug) - Personally Reviewed  ECG    AFib 65bpn, PVCs, QTc is OK - Personally Reviewed  Physical Exam   GEN: No acute distress.   Neck: No JVD Cardiac: irrieg-irreg, no murmurs, rubs, or gallops.  Respiratory: CTA b/l GI: Soft, nontender, non-distended  MS: No edema; No deformity. Neuro:  Nonfocal  Psych: Normal affect   Labs    High Sensitivity Troponin:  No results for input(s): TROPONINIHS in the last 720 hours.    Chemistry Recent Labs  Lab 09/25/19 1106 09/26/19 0422  NA 138 138  K 4.3 3.7  CL 102 102  CO2 25 26  GLUCOSE 132* 135*   BUN 25* 24*  CREATININE 2.31* 2.36*  CALCIUM 9.1 8.6*  GFRNONAA 28* 27*  GFRAA 32* 32*  ANIONGAP 11 10     HematologyNo results for input(s): WBC, RBC, HGB, HCT, MCV, MCH, MCHC, RDW, PLT in the last 168 hours.  BNPNo results for input(s): BNP, PROBNP in the last 168 hours.   DDimer No results for input(s): DDIMER in the last 168 hours.   Radiology    No results found.  Cardiac Studies   06/27/2018: TEE IMPRESSIONS  1. The left ventricle has normal systolic function, with an ejection  fraction of 55-60%.  2. Moderate mitral regurgitation.  3. No valvular vegetations seen.  4. 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    06/22/2018: TTE IMPRESSIONS  1. The left ventricle has normal systolic function, with an ejection  fraction of 55-60%. The cavity size was normal. Left ventricular diastolic  Doppler parameters are consistent with impaired relaxation.  2. The right ventricle has normal systolic function. The cavity was  mildly enlarged. There is no increase in right ventricular wall thickness.  3. Right atrial size was mildly dilated.  4. Moderate thickening  of the aortic valve Mild calcification of the  aortic valve.  5. There is dilatation of the aortic root measuring 39 mm.  6. No vegetations detected.   Patient Profile     69 y.o. male with a h/o bladder Ca with mets to intra-abdominal lymph nodes, currently undergoing chemo (on hold during his hospital stay), CKD (III), HTN, obesity, referred to AFib clinic for AFib management s/p DCCV with ERAF.  Flecainide considered though unable given a number of interactions with his current meds, decided to pursue Tikosyn.  Admitted for Tikosyn initiation  Assessment & Plan    1. Persistent AFib     CHA2DS2Vasc is 2, on [place], appropriately dosed     Tikosyn load is in progress     K+ 3.7, replaced     Mag 1.9, replaced     Creat 2.36 (his baseline), calc crCl using today's weight is 52     QT  is OK  PVCs at baseline  DCCV tomorrow if not in SR  Pt is aware and agreeable  2. HTN     Looks OK  For questions or updates, please contact [place] Please consult [email or web address] for contact info under        Signed, Baldwin Jamaica, PA-C  09/26/2019, 6:23 AM     I have seen, examined the patient, and reviewed the above assessment and plan.  Changes to above are made where necessary.  On exam, iRRR.  Remains in afib.  Will plan cardioversion tomorrow.  Risks of cardioversion discussed with the patient who wishes to proceed.  Co Sign: Thompson Grayer, MD

## 2019-09-26 NOTE — Progress Notes (Addendum)
Pharmacy: Dofetilide (Tikosyn) - Follow Up Assessment and Electrolyte Replacement  Pharmacy consulted to assist in monitoring and replacing electrolytes in this 69 y.o. male admitted on 09/25/2019 undergoing dofetilide initiation. First dofetilide dose: 09/25/19  Labs:    Component Value Date/Time   K 3.7 09/26/2019 0422   K 4.1 02/21/2017 0902   MG 1.9 09/26/2019 0422     Plan: Potassium: K 3.5-3.7:  Give KCl 30 mEq po x1 now then 66mEq this afternoon  Magnesium: Mg 1.8-2: Give Mg 2 gm IV x1    Will give potassium supplements today, will reassess ongoing need for supplements in am.    Thank you for allowing pharmacy to participate in this patient's care   Erin Hearing PharmD., BCPS Clinical Pharmacist 09/26/2019 7:54 AM

## 2019-09-26 NOTE — Plan of Care (Signed)
  Problem: Education: Goal: Knowledge of General Education information will improve Description Including pain rating scale, medication(s)/side effects and non-pharmacologic comfort measures Outcome: Progressing   Problem: Health Behavior/Discharge Planning: Goal: Ability to manage health-related needs will improve Outcome: Progressing   

## 2019-09-26 NOTE — TOC Initial Note (Signed)
Transition of Care [facility]) - Initial/Assessment Note    Patient Details  Name: [name] Rose MRN: [id number] Date of Birth: Mar 05, 1951  [facility] CM/SW Contact:    Bethena Roys, RN Phone Number: 09/26/2019, 4:45 PM  Clinical Narrative:  High risk for readmission assessment completed. Patient presented for Tikosyn Load. Benefits check completed and patient will have to pay out of pocket full cost for medication. Patient states that he will use [place] for the Good Rx program. Patient will need Paper Rx for this medication and refills. Patient states he makes it to appointments without any problems. No further needs from Case Manager at this time.                 Expected Discharge Plan: Home/Self Care Barriers to Discharge: No Barriers Identified   Patient Goals and CMS Choice Patient states their goals for this hospitalization and ongoing recovery are:: to return home   Choice offered to / list presented to : NA  Expected Discharge Plan and Services Expected Discharge Plan: Home/Self Care In-house Referral: NA Discharge Planning Services: CM Consult Post Acute Care Choice: NA Living arrangements for the past 2 months: Single Family Home                 DME Arranged: N/A    HH Arranged: NA     Prior Living Arrangements/Services Living arrangements for the past 2 months: Single Family Home Lives with:: Spouse Patient language and need for interpreter reviewed:: Yes Do you feel safe going back to the place where you live?: Yes      Need for Family Participation in Patient Care: No (Comment) Care giver support system in place?: No (comment)   Criminal Activity/Legal Involvement Pertinent to Current Situation/Hospitalization: No - Comment as needed  Activities of Daily Living Home Assistive Devices/Equipment: None ADL Screening (condition at time of admission) Patient's cognitive ability adequate to safely complete daily  activities?: Yes Is the patient deaf or have difficulty hearing?: No Does the patient have difficulty seeing, even when wearing glasses/contacts?: No Does the patient have difficulty concentrating, remembering, or making decisions?: No Patient able to express need for assistance with ADLs?: Yes Does the patient have difficulty dressing or bathing?: No Independently performs ADLs?: Yes (appropriate for developmental age) Does the patient have difficulty walking or climbing stairs?: No Weakness of Legs: None Weakness of Arms/Hands: None  Permission Sought/Granted Permission sought to share information with : Family Supports                Emotional Assessment Appearance:: Appears stated age Attitude/Demeanor/Rapport: Engaged Affect (typically observed): Appropriate Orientation: : Oriented to Situation, Oriented to  Time, Oriented to Place, Oriented to Self Alcohol / Substance Use: Not Applicable Psych Involvement: No (comment)  Admission diagnosis:  Chronic a-fib ([place]) [I48.20] Patient Active Problem List   Diagnosis Date Noted  . Severe muscle deconditioning 10/06/2018  . Chronic atrial fibrillation ([place])   . Acute renal failure with acute tubular necrosis superimposed on stage 3 chronic kidney disease ([place])   . Metastatic cancer ([place])   . Palliative care encounter   . Acute renal failure ([place])   . Acute respiratory failure with hypoxia ([place])   . Atypical pneumonia   . CKD (chronic kidney disease), stage III 09/08/2018  . Mitral valve insufficiency 08/17/2018  . Drug rash   . Essential hypertension 06/23/2018  . Hyperglycemia 06/23/2018  . Mild hyperlipidemia 06/23/2018  . Mucositis due to chemotherapy  06/22/2018  . AKI (acute kidney injury) ([place]) 06/22/2018  . Pancytopenia ([place]) 06/22/2018  . Staphylococcus aureus bacteremia   . Neutropenic fever ([place]) 06/21/2018  . Pneumonia   . Thrombocytopenia ([place])   . Chronic a-fib ([place])   . Abscess of abdominal cavity ([place])  12/19/2017  . IDA (iron deficiency anemia) 09/08/2017  . 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 Anorexia 10/17/2016  . Diarrhea 10/17/2016  . LFT elevation 10/17/2016  . Dehydration 10/17/2016  . Fatigue 10/17/2016  . Hypocalcemia 10/17/2016  . Goals of care, counseling/discussion 09/30/2016  . 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 ([place]) 09/29/2016  . Cancer of dome of urinary bladder ([place]) 09/20/2016  . Postoperative anemia due to acute blood loss 03/04/2015  . Hypokalemia 03/04/2015  . 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/03/2015   PCP:  Shirline Frees, MD Pharmacy:   [facility] [place], [place] [address] Phone: [phone] Fax: [phone]     Social Determinants of Health (SDOH) Interventions    Readmission Risk Interventions Readmission Risk Prevention Plan 09/26/2019 09/08/2018 06/28/2018  Transportation Screening Complete Complete Complete  PCP or Specialist Appt within 3-5 Days Complete - Complete  HRI or Home Care Consult Complete - Complete  Social Work Consult for [place] Planning/Counseling Complete - Complete  Palliative Care Screening Not Applicable - Not Applicable  Medication Review Press photographer) Complete Complete Complete  PCP or Specialist appointment within 3-5 days of discharge - Not Complete -  PCP/Specialist Appt Not Complete comments - not yet ready for d/c -  HRI or [place] - Not Complete -  [place] or [place] Pt Refusal Comments - no needs identified at this time -  SW Recovery Care/Counseling Consult - Not Complete -  SW Consult Not Complete Comments - no needs identified at this time -  Palliative Care Screening - Not Applicable -  [place] - Not Applicable -  Some recent data might be hidden

## 2019-09-27 ENCOUNTER — Inpatient Hospital Stay (HOSPITAL_COMMUNITY): Payer: PPO | Admitting: Certified Registered Nurse Anesthetist

## 2019-09-27 ENCOUNTER — Other Ambulatory Visit: Payer: Self-pay

## 2019-09-27 ENCOUNTER — Encounter (HOSPITAL_COMMUNITY)
Admission: RE | Disposition: A | Discharge status: Home / Self Care | Payer: Self-pay | Source: Ambulatory Visit | Attending: Internal Medicine

## 2019-09-27 ENCOUNTER — Telehealth: Payer: Self-pay | Admitting: Internal Medicine

## 2019-09-27 ENCOUNTER — Encounter (HOSPITAL_COMMUNITY): Payer: Self-pay | Admitting: Internal Medicine

## 2019-09-27 DIAGNOSIS — N1832 Chronic kidney disease, stage 3b: Secondary | ICD-10-CM

## 2019-09-27 DIAGNOSIS — I10 Essential (primary) hypertension: Secondary | ICD-10-CM

## 2019-09-27 DIAGNOSIS — I482 Chronic atrial fibrillation, unspecified: Secondary | ICD-10-CM

## 2019-09-27 HISTORY — PX: CARDIOVERSION: SHX1299

## 2019-09-27 LAB — BASIC METABOLIC PANEL
Anion gap: 9 (ref 5–15)
BUN: 25 mg/dL — ABNORMAL HIGH (ref 8–23)
CO2: 26 mmol/L (ref 22–32)
Calcium: 8.5 mg/dL — ABNORMAL LOW (ref 8.9–10.3)
Chloride: 102 mmol/L (ref 98–111)
Creatinine, Ser: 2.36 mg/dL — ABNORMAL HIGH (ref 0.61–1.24)
GFR calc Af Amer: 32 mL/min — ABNORMAL LOW (ref >60)
GFR calc non Af Amer: 27 mL/min — ABNORMAL LOW (ref >60)
Glucose, Bld: 115 mg/dL — ABNORMAL HIGH (ref 70–99)
Potassium: 4.2 mmol/L (ref 3.5–5.1)
Sodium: 137 mmol/L (ref 135–145)

## 2019-09-27 LAB — MAGNESIUM: Magnesium: 2.1 mg/dL (ref 1.7–2.4)

## 2019-09-27 SURGERY — CARDIOVERSION
Anesthesia: General

## 2019-09-27 MED ORDER — HYDROCORTISONE 1 % EX CREA
1.0000 "application " | TOPICAL_CREAM | Freq: Three times a day (TID) | CUTANEOUS | Status: DC | PRN
  Filled 2019-09-27: qty 28

## 2019-09-27 MED ORDER — PROPOFOL 10 MG/ML IV BOLUS
INTRAVENOUS | Status: DC | PRN
  Administered 2019-09-27: 10 mg via INTRAVENOUS
  Administered 2019-09-27: 20 mg via INTRAVENOUS
  Administered 2019-09-27: 90 mg via INTRAVENOUS

## 2019-09-27 MED ORDER — LIDOCAINE 2% (20 MG/ML) 5 ML SYRINGE
INTRAMUSCULAR | Status: DC | PRN
  Administered 2019-09-27: 40 mg via INTRAVENOUS

## 2019-09-27 MED ORDER — SODIUM CHLORIDE 0.9 % IV SOLN
INTRAVENOUS | Status: DC | PRN
Start: 2019-09-27 — End: 2019-09-27

## 2019-09-27 NOTE — Telephone Encounter (Signed)
Will attempt tier exception. Pt planning to use GoodRX card for cheaper price than through insurance.

## 2019-09-27 NOTE — Progress Notes (Signed)
Morning EKG reviewed  Shows has converted to NSR s/p DCCV at 69 bpm with stable QTc at ~470 ms. PVCs/Bigeminy noted prior to drug.  Continue Tikosyn 250 mcg BID.   QTc stable to continue current dose for now.  Shirley Friar, PA-C  Pager: [phone]  09/27/2019 2:55 PM

## 2019-09-27 NOTE — Plan of Care (Signed)
  Problem: Education: Goal: Knowledge of General Education information will improve Description: Including pain rating scale, medication(s)/side effects and non-pharmacologic comfort measures 09/27/2019 1558 by Hoover Brunette, RN Outcome: Progressing 09/27/2019 1555 by Hoover Brunette, RN Outcome: Progressing   Problem: Health Behavior/Discharge Planning: Goal: Ability to manage health-related needs will improve Outcome: Completed/Met   Problem: Clinical Measurements: Goal: Ability to maintain clinical measurements within normal limits will improve 09/27/2019 1558 by Hoover Brunette, RN Outcome: Progressing 09/27/2019 1555 by Hoover Brunette, RN Outcome: Progressing Goal: Will remain free from infection Outcome: Completed/Met Goal: Diagnostic test results will improve 09/27/2019 1558 by Hoover Brunette, RN Outcome: Progressing 09/27/2019 1555 by Hoover Brunette, RN Outcome: Progressing Goal: Respiratory complications will improve 09/27/2019 1558 by Hoover Brunette, RN Outcome: Progressing 09/27/2019 1555 by Hoover Brunette, RN Outcome: Progressing Goal: Cardiovascular complication will be avoided 09/27/2019 1558 by Hoover Brunette, RN Outcome: Progressing 09/27/2019 1555 by Hoover Brunette, RN Outcome: Progressing   Problem: Activity: Goal: Risk for activity intolerance will decrease 09/27/2019 1558 by Hoover Brunette, RN Outcome: Progressing 09/27/2019 1555 by Hoover Brunette, RN Outcome: Progressing   Problem: Nutrition: Goal: Adequate nutrition will be maintained Outcome: Completed/Met   Problem: Coping: Goal: Level of anxiety will decrease Outcome: Completed/Met   Problem: Elimination: Goal: Will not experience complications related to bowel motility Outcome: Completed/Met Goal: Will not experience complications related to urinary retention Outcome: Completed/Met   Problem: Pain Managment: Goal: General experience of comfort will improve Outcome: Completed/Met    Problem: Safety: Goal: Ability to remain free from injury will improve 09/27/2019 1558 by Hoover Brunette, RN Outcome: Progressing 09/27/2019 1555 by Hoover Brunette, RN Outcome: Progressing   Problem: Skin Integrity: Goal: Risk for impaired skin integrity will decrease Outcome: Completed/Met   Problem: Education: Goal: Knowledge of disease or condition will improve Outcome: Completed/Met Goal: Understanding of medication regimen will improve 09/27/2019 1558 by Hoover Brunette, RN Outcome: Progressing 09/27/2019 1555 by Hoover Brunette, RN Outcome: Progressing Goal: Individualized Educational Video(s) 09/27/2019 1558 by Hoover Brunette, RN Outcome: Progressing 09/27/2019 1555 by Hoover Brunette, RN Outcome: Progressing   Problem: Education: Goal: Knowledge of disease or condition will improve Outcome: Completed/Met Goal: Understanding of medication regimen will improve 09/27/2019 1558 by Hoover Brunette, RN Outcome: Progressing 09/27/2019 1555 by Hoover Brunette, RN Outcome: Progressing Goal: Individualized Educational Video(s) 09/27/2019 1558 by Hoover Brunette, RN Outcome: Progressing 09/27/2019 1555 by Hoover Brunette, RN Outcome: Progressing   Problem: Activity: Goal: Ability to tolerate increased activity will improve 09/27/2019 1558 by Hoover Brunette, RN Outcome: Progressing 09/27/2019 1555 by Hoover Brunette, RN Outcome: Progressing   Problem: Cardiac: Goal: Ability to achieve and maintain adequate cardiopulmonary perfusion will improve 09/27/2019 1558 by Hoover Brunette, RN Outcome: Progressing 09/27/2019 1555 by Hoover Brunette, RN Outcome: Progressing   Problem: Health Behavior/Discharge Planning: Goal: Ability to safely manage health-related needs after discharge will improve 09/27/2019 1558 by Hoover Brunette, RN Outcome: Progressing 09/27/2019 1555 by Hoover Brunette, RN Outcome: Progressing

## 2019-09-27 NOTE — Anesthesia Procedure Notes (Addendum)
Procedure Name: MAC Date/Time: 09/27/2019 9:23 AM Performed by: Inda Coke, CRNA Pre-anesthesia Checklist: Patient identified, Emergency Drugs available, Suction available, Timeout performed and Patient being monitored Patient Re-evaluated:Patient Re-evaluated prior to induction Oxygen Delivery Method: Ambu bag Preoxygenation: Pre-oxygenation with 100% oxygen Induction Type: IV induction Dental Injury: Teeth and Oropharynx as per pre-operative assessment

## 2019-09-27 NOTE — Telephone Encounter (Signed)
Spoke with [employer] - tier exception in process

## 2019-09-27 NOTE — CV Procedure (Signed)
Procedure:   DCCV  Indication:  Symptomatic atrial fibrillation/flutter  Procedure Note:  The patient signed informed consent.  They have had had therapeutic anticoagulation with Eliquis greater than 3 weeks.  Anesthesia was administered by Dr. Nyoka Cowden.  Adequate airway was maintained throughout and vital followed per protocol.  They were cardioverted x 3 with 200J of biphasic synchronized energy.  After initial 200J shock, remained in AF with frequent PVCs.  On second 200J, pressure was applied to anterior pad but remained in AF.  For 3rd 200J shock, anterior pad was repositioned and pressure was applied, and patient converted to NSR with frequent PVCs.  There were no apparent complications.  The patient had normal neuro status and respiratory status post procedure with vitals stable as recorded elsewhere.    Follow up:  They will continue on current medical therapy and follow up with cardiology as scheduled.  Oswaldo Milian, MD 09/27/2019 9:43 AM

## 2019-09-27 NOTE — Anesthesia Postprocedure Evaluation (Signed)
Anesthesia Post Note  Patient: [name] Rose  Procedure(s) Performed: CARDIOVERSION (N/A )     Patient location during evaluation: Endoscopy Anesthesia Type: General Level of consciousness: awake Pain management: pain level controlled Vital Signs Assessment: post-procedure vital signs reviewed and stable Respiratory status: spontaneous breathing Cardiovascular status: stable Postop Assessment: no apparent nausea or vomiting Anesthetic complications: no   No complications documented.  Last Vitals:  Vitals:   09/27/19 1000 09/27/19 1033  BP: 123/73 113/83  Pulse: 68   Resp: 13 16  Temp:  36.4 C  SpO2: 98% 97%    Last Pain:  Vitals:   09/27/19 1033  TempSrc: Oral  PainSc:                  Dala Breault

## 2019-09-27 NOTE — Plan of Care (Addendum)
  Problem: Education: Goal: Knowledge of General Education information will improve Description: Including pain rating scale, medication(s)/side effects and non-pharmacologic comfort measures Outcome: Progressing   Problem: Health Behavior/Discharge Planning: Goal: Ability to manage health-related needs will improve Outcome: Completed/Met   Problem: Clinical Measurements: Goal: Ability to maintain clinical measurements within normal limits will improve Outcome: Progressing Goal: Will remain free from infection Outcome: Completed/Met Goal: Diagnostic test results will improve Outcome: Progressing Goal: Respiratory complications will improve Outcome: Progressing Goal: Cardiovascular complication will be avoided Outcome: Progressing   Problem: Activity: Goal: Risk for activity intolerance will decrease Outcome: Progressing   Problem: Nutrition: Goal: Adequate nutrition will be maintained Outcome: Completed/Met   Problem: Coping: Goal: Level of anxiety will decrease Outcome: Completed/Met   Problem: Elimination: Goal: Will not experience complications related to bowel motility Outcome: Completed/Met Goal: Will not experience complications related to urinary retention Outcome: Completed/Met   Problem: Pain Managment: Goal: General experience of comfort will improve Outcome: Completed/Met   Problem: Safety: Goal: Ability to remain free from injury will improve Outcome: Progressing   Problem: Skin Integrity: Goal: Risk for impaired skin integrity will decrease Outcome: Completed/Met   Problem: Education: Goal: Knowledge of disease or condition will improve Outcome: Completed/Met Goal: Understanding of medication regimen will improve Outcome: Progressing Goal: Individualized Educational Video(s) Outcome: Progressing   Problem: Activity: Goal: Ability to tolerate increased activity will improve Outcome: Progressing   Problem: Cardiac: Goal: Ability to achieve  and maintain adequate cardiopulmonary perfusion will improve Outcome: Progressing   Problem: Health Behavior/Discharge Planning: Goal: Ability to safely manage health-related needs after discharge will improve Outcome: Progressing

## 2019-09-27 NOTE — Anesthesia Preprocedure Evaluation (Signed)
Anesthesia Evaluation  Patient identified by MRN, date of birth, ID band Patient awake    Reviewed: Allergy & Precautions, NPO status , Patient's Chart, lab work & pertinent test results  Airway Mallampati: II  TM Distance: >3 FB     Dental   Pulmonary shortness of breath, pneumonia, former smoker,    breath sounds clear to auscultation       Cardiovascular hypertension, + dysrhythmias Atrial Fibrillation  Rhythm:Irregular Rate:Normal     Neuro/Psych    GI/Hepatic negative GI ROS, Neg liver ROS,   Endo/Other    Renal/GU Renal disease     Musculoskeletal  (+) Arthritis ,   Abdominal   Peds  Hematology  (+) anemia ,   Anesthesia Other Findings   Reproductive/Obstetrics                             Anesthesia Physical Anesthesia Plan  ASA: III  Anesthesia Plan: General   Post-op Pain Management:    Induction: Intravenous  PONV Risk Score and Plan: 2 and Propofol infusion  Airway Management Planned: Nasal Cannula and Simple Face Mask  Additional Equipment:   Intra-op Plan:   Post-operative Plan:   Informed Consent: I have reviewed the patients History and Physical, chart, labs and discussed the procedure including the risks, benefits and alternatives for the proposed anesthesia with the patient or authorized representative who has indicated his/her understanding and acceptance.     Dental advisory given  Plan Discussed with: CRNA and Anesthesiologist  Anesthesia Plan Comments:         Anesthesia Quick Evaluation

## 2019-09-27 NOTE — Telephone Encounter (Signed)
    Pt c/o medication issue:  1. Name of Medication: dosetilide 500 mcg  2. How are you currently taking this medication (dosage and times per day)?   3. Are you having a reaction (difficulty breathing--STAT)?   4. What is your medication issue? Aisha from [employer] called, she would like for Dr. Rayann Heman know that this medication is actually covered by insurance but it's on tier 4

## 2019-09-27 NOTE — Progress Notes (Signed)
Pharmacy: Dofetilide (Tikosyn) - Follow Up Assessment and Electrolyte Replacement  Pharmacy consulted to assist in monitoring and replacing electrolytes in this 69 y.o. male admitted on 09/25/2019 undergoing dofetilide initiation. First dofetilide dose: 09/25/19 Plan for DCCV 6/17 if does not convert toSR apixaban $RemoveBefor'5mg'VgZQKRDhyijk$  bid for anticoagulaition cbc ok    Labs:    Component Value Date/Time   K 4.2 09/27/2019 0322   K 4.1 02/21/2017 0902   MG 2.1 09/27/2019 0322     Plan: Potassium: K 4.2 this am after replaced yesterday no supplementation PTA  No KCL today check labs in am - may need KCL 56meq daily   Magnesium: Mag 2.1 after iv replacement 6/16 and ongoing magox from PTA No changes today   Bonnita Nasuti Pharm.D. CPP, BCPS Clinical Pharmacist [phone] 09/27/2019 7:13 AM

## 2019-09-27 NOTE — Transfer of Care (Signed)
Immediate Anesthesia Transfer of Care Note  Patient: [name] Rose  Procedure(s) Performed: CARDIOVERSION (N/A )  Patient Location: PACU and Endoscopy Unit  Anesthesia Type:General  Level of Consciousness: awake and drowsy  Airway & Oxygen Therapy: Patient Spontanous Breathing  Post-op Assessment: Report given to RN and Post -op Vital signs reviewed and stable  Post vital signs: Reviewed and stable  Last Vitals:  Vitals Value Taken Time  BP 118/70 09/27/19 0941  Temp    Pulse 72 09/27/19 0944  Resp 12 09/27/19 0944  SpO2 93 % 09/27/19 0944    Last Pain:  Vitals:   09/27/19 0841  TempSrc: Tympanic  PainSc: 0-No pain      Patients Stated Pain Goal: 0 (04/05/82 4621)  Complications: No complications documented.

## 2019-09-27 NOTE — Progress Notes (Signed)
Cardiology notified of pt's qtc post tikosyn noted to be 505 per ekg. Will continue to monitor pt & await new orders as needed. Hoover Brunette, RN

## 2019-09-27 NOTE — Progress Notes (Addendum)
Electrophysiology Rounding Note  Patient Name: [name] Rose Date of Encounter: 09/27/2019  Primary Cardiologist: Pixie Casino, MD  Electrophysiologist: None    Subjective   Pt remains in afib on Tikosyn 250 mcg BID   QTc from EKG last pm shows stable QTc ~ 440 when measured manually  The patient is doing well today.  At this time, the patient denies chest pain, shortness of breath, or any new concerns.  Inpatient Medications    Scheduled Meds: . apixaban  5 mg Oral BID  . atorvastatin  40 mg Oral QPM  . diltiazem  360 mg Oral Daily  . dofetilide  250 mcg Oral BID  . gabapentin  800 mg Oral BID  . magnesium oxide  400 mg Oral BID  . metoprolol succinate  50 mg Oral BID  . sodium chloride flush  3 mL Intravenous Q12H   Continuous Infusions: . sodium chloride     PRN Meds: sodium chloride, hydrocortisone cream, sodium chloride flush, traMADol   Vital Signs    Vitals:   09/27/19 0944 09/27/19 0955 09/27/19 1000 09/27/19 1033  BP: 117/67 106/79 123/73 113/83  Pulse: 72 76 68   Resp: $Remo'12 18 13 16  'WbVHG$ Temp: (!) 96.8 F (36 C)   97.6 F (36.4 C)  TempSrc: Temporal   Oral  SpO2: 93% 95% 98% 97%  Weight:      Height:        Intake/Output Summary (Last 24 hours) at 09/27/2019 1154 Last data filed at 09/27/2019 0943 Gross per 24 hour  Intake 460 ml  Output 400 ml  Net 60 ml   Filed Weights   09/25/19 1355 09/26/19 0442 09/27/19 0604  Weight: 124.1 kg 123.8 kg 122.3 kg    Physical Exam    GEN- The patient is well appearing, alert and oriented x 3 today.   Head- normocephalic, atraumatic Eyes-  Sclera clear, conjunctiva pink Ears- hearing intact Oropharynx- clear Neck- supple Lungs- Clear to ausculation bilaterally, normal work of breathing Heart- Irregularly irregular rate and rhythm, no murmurs, rubs or gallops GI- soft, NT, ND, + BS Extremities- no clubbing, cyanosis, or edema Skin- no rash or lesion Psych- euthymic mood, full affect Neuro-  strength and sensation are intact  Labs    CBC No results for input(s): WBC, NEUTROABS, HGB, HCT, MCV, PLT in the last 72 hours.   Basic Metabolic Panel Recent Labs    09/26/19 0422 09/27/19 0322  NA 138 137  K 3.7 4.2  CL 102 102  CO2 26 26  GLUCOSE 135* 115*  BUN 24* 25*  CREATININE 2.36* 2.36*  CALCIUM 8.6* 8.5*  MG 1.9 2.1    Potassium  Date/Time Value Ref Range Status  09/27/2019 03:22 AM 4.2 3.5 - 5.1 mmol/L Final  02/21/2017 09:02 AM 4.1 3.3 - 4.7 mEq/L Final   Magnesium  Date/Time Value Ref Range Status  09/27/2019 03:22 AM 2.1 1.7 - 2.4 mg/dL Final    Comment:    Performed at [facility], [place] [address] [place], [place] [address]    Telemetry    Remains in AF/flutter with occasional PVCs/bigeminy (noted prior to drug) (personally reviewed)  Radiology    No results found.   Patient Profile     [name] Rose is a 69 y.o. male with a past medical history significant for persistent atrial fibrillation.  They were admitted for tikosyn load.   Assessment & Plan    1. Persistent atrial fibrillation Pt remains  in afib on Tikosyn 250 mcg BID  Continue Eliquis Electrolytes stable.  CHA2DS2VASC is at least 3  2. HTN Follow once in sinus.   If pt does not convert chemically, plan on DCCV this morning   For questions or updates, please contact [place] Please consult [email or web address] for contact info under Cardiology/STEMI.  Signed, Shirley Friar, PA-C  09/27/2019, 11:54 AM   I have seen, examined the patient, and reviewed the above assessment and plan.  Changes to above are made where necessary.  On exam, RRR.  Pt doing well s/p cardioversion.  HTN is stable.  No changes required at this time.  CRI stage IIIb also stable.    Co Sign: Thompson Grayer, MD 09/27/2019 3:06 PM

## 2019-09-27 NOTE — Interval H&P Note (Signed)
History and Physical Interval Note:  09/27/2019 8:55 AM  [name] Rose  has presented today for surgery, with the diagnosis of afib.  The various methods of treatment have been discussed with the patient and family. After consideration of risks, benefits and other options for treatment, the patient has consented to  Procedure(s): CARDIOVERSION (N/A) as a surgical intervention.  The patient's history has been reviewed, patient examined, no change in status, stable for surgery.  I have reviewed the patient's chart and labs.  Questions were answered to the patient's satisfaction.     Donato Heinz

## 2019-09-28 ENCOUNTER — Other Ambulatory Visit (HOSPITAL_COMMUNITY): Payer: Self-pay | Admitting: *Deleted

## 2019-09-28 ENCOUNTER — Other Ambulatory Visit: Payer: Self-pay

## 2019-09-28 LAB — BASIC METABOLIC PANEL
Anion gap: 8 (ref 5–15)
BUN: 27 mg/dL — ABNORMAL HIGH (ref 8–23)
CO2: 29 mmol/L (ref 22–32)
Calcium: 8.8 mg/dL — ABNORMAL LOW (ref 8.9–10.3)
Chloride: 100 mmol/L (ref 98–111)
Creatinine, Ser: 2.48 mg/dL — ABNORMAL HIGH (ref 0.61–1.24)
GFR calc Af Amer: 30 mL/min — ABNORMAL LOW (ref >60)
GFR calc non Af Amer: 26 mL/min — ABNORMAL LOW (ref >60)
Glucose, Bld: 135 mg/dL — ABNORMAL HIGH (ref 70–99)
Potassium: 4.4 mmol/L (ref 3.5–5.1)
Sodium: 137 mmol/L (ref 135–145)

## 2019-09-28 LAB — MAGNESIUM: Magnesium: 2.1 mg/dL (ref 1.7–2.4)

## 2019-09-28 MED ORDER — DOFETILIDE 250 MCG PO CAPS: 250.0000 ug | ORAL_CAPSULE | Freq: Two times a day (BID) | ORAL | 6 refills | Status: DC

## 2019-09-28 NOTE — Telephone Encounter (Signed)
Tier exception approved through 04/11/20

## 2019-09-28 NOTE — Discharge Summary (Signed)
ELECTROPHYSIOLOGY PROCEDURE DISCHARGE SUMMARY    Patient ID: [name] Rose,  MRN: [id number], DOB/AGE: February 01, 1951 69 y.o.  Admit date: 09/25/2019 Discharge date: 09/28/2019  Primary Care Physician: Shirline Frees, MD  Primary Cardiologist: Pixie Casino, MD  Electrophysiologist: None   Primary Discharge Diagnosis:  1.  Persistent atrial fibrillation status post Tikosyn loading this admission  Secondary Discharge Diagnosis:  2. HTN  No Known Allergies   Procedures This Admission:  1.  Tikosyn loading 2.  Direct current cardioversion on Thursday September 27, 2019  by Dr Gardiner Rhyme which successfully restored SR.  There were no early apparent complications.   Brief HPI: [name] Rose is a 69 y.o. male with a past medical history as noted above.  They were referred to EP in the outpatient setting for treatment options of atrial fibrillation.  Risks, benefits, and alternatives to Tikosyn were reviewed with the patient who wished to proceed.    Hospital Course:  The patient was admitted and Tikosyn was initiated.  Renal function and electrolytes were followed during the hospitalization.  Their QTc remained stable.  On 09/27/2019 they underwent direct current cardioversion which restored sinus rhythm.  They were monitored until discharge on telemetry which demonstrated continued NSR.  On the day of discharge, they were examined by Dr. Rayann Heman  who considered them stable for discharge to home.  Follow-up has been arranged with the Atrial Fibrillation clinic in approximately 1 week and with Tommye Standard, PA-C  in 4 weeks.   Physical Exam: Vitals:   09/27/19 1723 09/27/19 2111 09/28/19 0624 09/28/19 0825  BP: 116/85 103/83 116/68 124/78  Pulse: 70 63 71   Resp:  17 15   Temp:  98.2 F (36.8 C) 97.9 F (36.6 C)   TempSrc:  Oral Oral   SpO2:  95% 96%   Weight:   122.7 kg   Height:        GEN- The patient is well appearing, alert and oriented x 3 today.   HEENT: normocephalic,  atraumatic; sclera clear, conjunctiva pink; hearing intact; oropharynx clear; neck supple, no JVP Lymph- no cervical lymphadenopathy Lungs- Clear to ausculation bilaterally, normal work of breathing.  No wheezes, rales, rhonchi Heart- Regular rate and rhythm, no murmurs, rubs or gallops, PMI not laterally displaced GI- soft, non-tender, non-distended, bowel sounds present, no hepatosplenomegaly Extremities- no clubbing, cyanosis, or edema; DP/PT/radial pulses 2+ bilaterally MS- no significant deformity or atrophy Skin- warm and dry, no rash or lesion Psych- euthymic mood, full affect Neuro- strength and sensation are intact   Labs:   Lab Results  Component Value Date   WBC 9.1 09/18/2019   HGB 9.7 (L) 09/18/2019   HCT 30.7 (L) 09/18/2019   MCV 98.4 09/18/2019   PLT 133 (L) 09/18/2019    Recent Labs  Lab 09/28/19 0504  NA 137  K 4.4  CL 100  CO2 29  BUN 27*  CREATININE 2.48*  CALCIUM 8.8*  GLUCOSE 135*     Discharge Medications:  Allergies as of 09/28/2019   No Known Allergies     Medication List    TAKE these medications   acetaminophen 500 MG tablet Commonly known as: TYLENOL Take 1,000 mg by mouth every 6 (six) hours as needed for mild pain or headache.   amoxicillin 500 MG capsule Commonly known as: AMOXIL TAKE 4 CAPS (2,000 MG TOTAL) BY MOUTH ONCE FOR 1 DOSE. TAKE 1 HOUR PRIOR TO DENTAL PROCEDURE What changed: See the new instructions.  apixaban 5 MG Tabs tablet Commonly known as: Eliquis Take 1 tablet (5 mg total) by mouth 2 (two) times daily.   atorvastatin 40 MG tablet Commonly known as: LIPITOR Take 40 mg by mouth every evening.   dexamethasone 4 MG tablet Commonly known as: DECADRON Take 2 tablets (8 mg total) by mouth daily. Start the day after chemotherapy for 2 days. Take with food.   diltiazem 360 MG 24 hr capsule Commonly known as: CARDIZEM CD TAKE 1 CAPSULE BY MOUTH EVERY DAY What changed: how much to take   dofetilide 250 MCG  capsule Commonly known as: TIKOSYN Take 1 capsule (250 mcg total) by mouth 2 (two) times daily.   gabapentin 400 MG capsule Commonly known as: NEURONTIN TAKE 1 CAPSULE (400 MG TOTAL) BY MOUTH 4 (FOUR) TIMES DAILY. What changed:   how much to take  when to take this   LORazepam 0.5 MG tablet Commonly known as: Ativan Take 1 tablet (0.5 mg total) by mouth every 6 (six) hours as needed (Nausea or vomiting). What changed: reasons to take this   magnesium oxide 400 MG tablet Commonly known as: MAG-OX Take 1 tablet (400 mg total) by mouth 2 (two) times daily. What changed:   how much to take  when to take this  additional instructions   metoprolol succinate 50 MG 24 hr tablet Commonly known as: TOPROL-XL Take 1 tablet (50 mg total) by mouth in the morning and at bedtime.   prochlorperazine 10 MG tablet Commonly known as: COMPAZINE Take 1 tablet (10 mg total) by mouth every 6 (six) hours as needed (Nausea or vomiting). What changed: reasons to take this   pyridoxine 100 MG tablet Commonly known as: B-6 Take 200 mg by mouth daily with breakfast.   traMADol 50 MG tablet Commonly known as: ULTRAM Take 50 mg by mouth daily as needed (for pain).   VITAMIN D-3 PO Take 1 capsule by mouth daily with breakfast.       Disposition:    Follow-up Information    [facility] Follow up on 10/05/2019.   Specialty: Cardiology Why: at 1100 am for post hospital tikosyn follow up Contact information: [address] [id number] [place] [address] [phone]       Baldwin Jamaica, PA-C Follow up on 10/29/2019.   Specialty: Cardiology Why: at 0930 am for post hospital tikosyn follow up with Dr. Jackalyn Lombard PA Contact information: [address] [place] [address] [phone]               Duration of Discharge Encounter: Greater than 30 minutes including physician time.  Jacalyn Lefevre,  PA-C  09/28/2019 1:33 PM

## 2019-09-28 NOTE — Progress Notes (Signed)
Pharmacy: Dofetilide (Tikosyn) - Follow Up Assessment and Electrolyte Replacement  Pharmacy consulted to assist in monitoring and replacing electrolytes in this 70 y.o. male admitted on 09/25/2019 undergoing dofetilide initiation. First dofetilide dose: 09/25/19 SR s/p DCCV 6/17  apixaban $RemoveB'5mg'siPGERVR$  bid for anticoagulaition cbc ok    Labs:    Component Value Date/Time   K 4.4 09/28/2019 0504   K 4.1 02/21/2017 0902   MG 2.1 09/28/2019 0504     Plan: Potassium: K 4.4 this am after initial replacement  no supplementation PTA  No KCL today check labs in am -   Magnesium: Mag 2.1 after iv replacement 6/16 and ongoing magox from PTA No changes today   Bonnita Nasuti Pharm.D. CPP, BCPS Clinical Pharmacist [phone] 09/28/2019 6:53 AM

## 2019-09-28 NOTE — Progress Notes (Signed)
Doing well this am.  If qt remains stable, would anticipate discharge to home later today.  Thompson Grayer MD, [place] 09/28/2019 8:38 AM

## 2019-09-28 NOTE — Progress Notes (Signed)
Evening EKG reviewed  Shows remains in NSR at 68 bpm with stable QTc at ~470 ms when measured manually  Continue Tikosyn 250 mcg BID.   Home today if QTc remains stable. Full note pending disposition.   Shirley Friar, PA-C  Pager: [phone]  09/28/2019 10:15 AM

## 2019-09-29 DIAGNOSIS — M6281 Muscle weakness (generalized): Secondary | ICD-10-CM | POA: Diagnosis not present

## 2019-10-02 ENCOUNTER — Inpatient Hospital Stay (HOSPITAL_BASED_OUTPATIENT_CLINIC_OR_DEPARTMENT_OTHER): Payer: PPO | Admitting: Hematology & Oncology

## 2019-10-02 ENCOUNTER — Inpatient Hospital Stay: Payer: PPO

## 2019-10-02 ENCOUNTER — Other Ambulatory Visit: Payer: Self-pay

## 2019-10-02 ENCOUNTER — Encounter: Payer: Self-pay | Admitting: Hematology & Oncology

## 2019-10-02 VITALS — BP 128/77 | HR 78 | Temp 97.5°F | Resp 19 | Wt 274.0 lb

## 2019-10-02 DIAGNOSIS — C679 Malignant neoplasm of bladder, unspecified: Secondary | ICD-10-CM

## 2019-10-02 DIAGNOSIS — C772 Secondary and unspecified malignant neoplasm of intra-abdominal lymph nodes: Secondary | ICD-10-CM

## 2019-10-02 DIAGNOSIS — Z5111 Encounter for antineoplastic chemotherapy: Secondary | ICD-10-CM | POA: Diagnosis not present

## 2019-10-02 DIAGNOSIS — G629 Polyneuropathy, unspecified: Secondary | ICD-10-CM | POA: Diagnosis not present

## 2019-10-02 DIAGNOSIS — Z7901 Long term (current) use of anticoagulants: Secondary | ICD-10-CM | POA: Diagnosis not present

## 2019-10-02 DIAGNOSIS — K5780 Diverticulitis of intestine, part unspecified, with perforation and abscess without bleeding: Secondary | ICD-10-CM | POA: Diagnosis not present

## 2019-10-02 DIAGNOSIS — I4891 Unspecified atrial fibrillation: Secondary | ICD-10-CM | POA: Diagnosis not present

## 2019-10-02 DIAGNOSIS — Z7952 Long term (current) use of systemic steroids: Secondary | ICD-10-CM | POA: Diagnosis not present

## 2019-10-02 DIAGNOSIS — D500 Iron deficiency anemia secondary to blood loss (chronic): Secondary | ICD-10-CM | POA: Diagnosis not present

## 2019-10-02 DIAGNOSIS — Z95828 Presence of other vascular implants and grafts: Secondary | ICD-10-CM

## 2019-10-02 DIAGNOSIS — Z79899 Other long term (current) drug therapy: Secondary | ICD-10-CM | POA: Diagnosis not present

## 2019-10-02 LAB — CBC WITH DIFFERENTIAL (CANCER CENTER ONLY)
Abs Immature Granulocytes: 0.1 10*3/uL — ABNORMAL HIGH (ref 0.00–0.07)
Basophils Absolute: 0 10*3/uL (ref 0.0–0.1)
Basophils Relative: 1 %
Eosinophils Absolute: 0.2 10*3/uL (ref 0.0–0.5)
Eosinophils Relative: 3 %
HCT: 34.3 % — ABNORMAL LOW (ref 39.0–52.0)
Hemoglobin: 10.8 g/dL — ABNORMAL LOW (ref 13.0–17.0)
Immature Granulocytes: 1 %
Lymphocytes Relative: 14 %
Lymphs Abs: 1.1 10*3/uL (ref 0.7–4.0)
MCH: 30.8 pg (ref 26.0–34.0)
MCHC: 31.5 g/dL (ref 30.0–36.0)
MCV: 97.7 fL (ref 80.0–100.0)
Monocytes Absolute: 0.7 10*3/uL (ref 0.1–1.0)
Monocytes Relative: 9 %
Neutro Abs: 5.5 10*3/uL (ref 1.7–7.7)
Neutrophils Relative %: 72 %
Platelet Count: 135 10*3/uL — ABNORMAL LOW (ref 150–400)
RBC: 3.51 MIL/uL — ABNORMAL LOW (ref 4.22–5.81)
RDW: 15.9 % — ABNORMAL HIGH (ref 11.5–15.5)
WBC Count: 7.7 10*3/uL (ref 4.0–10.5)
nRBC: 0 % (ref 0.0–0.2)

## 2019-10-02 LAB — CMP (CANCER CENTER ONLY)
ALT: 17 U/L (ref 0–44)
AST: 23 U/L (ref 15–41)
Albumin: 3.7 g/dL (ref 3.5–5.0)
Alkaline Phosphatase: 70 U/L (ref 38–126)
Anion gap: 7 (ref 5–15)
BUN: 29 mg/dL — ABNORMAL HIGH (ref 8–23)
CO2: 29 mmol/L (ref 22–32)
Calcium: 9.2 mg/dL (ref 8.9–10.3)
Chloride: 99 mmol/L (ref 98–111)
Creatinine: 2.29 mg/dL — ABNORMAL HIGH (ref 0.61–1.24)
GFR, Est AFR Am: 33 mL/min — ABNORMAL LOW (ref >60)
GFR, Estimated: 28 mL/min — ABNORMAL LOW (ref >60)
Glucose, Bld: 204 mg/dL — ABNORMAL HIGH (ref 70–99)
Potassium: 3.7 mmol/L (ref 3.5–5.1)
Sodium: 135 mmol/L (ref 135–145)
Total Bilirubin: 0.4 mg/dL (ref 0.3–1.2)
Total Protein: 6.2 g/dL — ABNORMAL LOW (ref 6.5–8.1)

## 2019-10-02 LAB — IRON AND TIBC
Iron: 72 ug/dL (ref 42–163)
Saturation Ratios: 28 % (ref 20–55)
TIBC: 261 ug/dL (ref 202–409)
UIBC: 189 ug/dL (ref 117–376)

## 2019-10-02 LAB — RETICULOCYTES
Immature Retic Fract: 15.3 % (ref 2.3–15.9)
RBC.: 3.52 MIL/uL — ABNORMAL LOW (ref 4.22–5.81)
Retic Count, Absolute: 83.1 10*3/uL (ref 19.0–186.0)
Retic Ct Pct: 2.4 % (ref 0.4–3.1)

## 2019-10-02 LAB — FERRITIN: Ferritin: 2367 ng/mL — ABNORMAL HIGH (ref 24–336)

## 2019-10-02 LAB — SAMPLE TO BLOOD BANK

## 2019-10-02 LAB — LACTATE DEHYDROGENASE: LDH: 227 U/L — ABNORMAL HIGH (ref 98–192)

## 2019-10-02 MED ORDER — PALONOSETRON HCL INJECTION 0.25 MG/5ML
INTRAVENOUS | Status: AC
  Filled 2019-10-02: qty 5

## 2019-10-02 MED ORDER — DULOXETINE HCL 60 MG PO CPEP
60.0000 mg | ORAL_CAPSULE | Freq: Every day | ORAL | 3 refills | Status: DC
Start: 2019-10-02 — End: 2020-01-21

## 2019-10-02 MED ORDER — SODIUM CHLORIDE 0.9 % IV SOLN
10.0000 mg | Freq: Once | INTRAVENOUS | Status: AC
  Administered 2019-10-02: 10 mg via INTRAVENOUS
  Filled 2019-10-02: qty 10

## 2019-10-02 MED ORDER — SODIUM CHLORIDE 0.9 % IV SOLN
Freq: Once | INTRAVENOUS | Status: AC
  Filled 2019-10-02: qty 60

## 2019-10-02 MED ORDER — SODIUM CHLORIDE 0.9 % IV SOLN
500.0000 mg/m2 | Freq: Once | INTRAVENOUS | Status: AC
  Administered 2019-10-02: 1254 mg via INTRAVENOUS
  Filled 2019-10-02: qty 26.3

## 2019-10-02 MED ORDER — SODIUM CHLORIDE 0.9 % IV SOLN
Freq: Once | INTRAVENOUS | Status: AC
  Filled 2019-10-02: qty 250

## 2019-10-02 MED ORDER — SODIUM CHLORIDE 0.9 % IV SOLN
400.0000 mg/m2 | Freq: Once | INTRAVENOUS | Status: AC
  Administered 2019-10-02: 1000 mg via INTRAVENOUS
  Filled 2019-10-02: qty 10

## 2019-10-02 MED ORDER — HEPARIN SOD (PORK) LOCK FLUSH 100 UNIT/ML IV SOLN
500.0000 [IU] | Freq: Once | INTRAVENOUS | Status: AC | PRN
  Administered 2019-10-02: 500 [IU]
  Filled 2019-10-02: qty 5

## 2019-10-02 MED ORDER — PALONOSETRON HCL INJECTION 0.25 MG/5ML
0.2500 mg | Freq: Once | INTRAVENOUS | Status: AC
  Administered 2019-10-02: 0.25 mg via INTRAVENOUS

## 2019-10-02 MED ORDER — SODIUM CHLORIDE 0.9% FLUSH
10.0000 mL | Freq: Once | INTRAVENOUS | Status: DC
  Administered 2019-10-02: 10 mL via INTRAVENOUS
  Filled 2019-10-02: qty 10

## 2019-10-02 MED ORDER — SODIUM CHLORIDE 0.9% FLUSH
10.0000 mL | INTRAVENOUS | Status: DC | PRN
  Administered 2019-10-02: 10 mL
  Filled 2019-10-02: qty 10

## 2019-10-02 NOTE — Progress Notes (Signed)
Ok to treat with creatinine of2.26 per Dr Marin Olp. dph

## 2019-10-02 NOTE — Patient Instructions (Signed)
Ifosfamide injection What is this medicine? IFOSFAMIDE (eye FOS fa mide) is a chemotherapy drug. It slows the growth of cancer cells. This medicine is used to treat testicular cancer and other cancers. This medicine may be used for other purposes; ask your health care provider or pharmacist if you have questions. COMMON BRAND NAME(S): Ifex, Ifex/Mesna What should I tell my health care provider before I take this medicine? They need to know if you have any of these conditions:  heart disease  history of irregular heartbeat  immune system problems  infection (especially a virus infection such as chickenpox, cold sores, or herpes)  infections in the bladder, kidneys, or urinary tract  kidney disease  low blood counts, like low white cell, platelet, or red cell counts  lung or breathing disease, like asthma  problems urinating  recent or ongoing radiation therapy  tingling of the fingers or toes, or other nerve disorder  an unusual or allergic reaction to ifosfamide, other medicines, foods, dyes, or preservatives  pregnant or trying to get pregnant  breast-feeding How should I use this medicine? This drug is given as an infusion into a vein. It is administered in a hospital or clinic by a specially trained health care professional. Talk to your pediatrician regarding the use of this medicine in children. Special care may be needed. Overdosage: If you think you have taken too much of this medicine contact a poison control center or emergency room at once. NOTE: This medicine is only for you. Do not share this medicine with others. What if I miss a dose? It is important not to miss your dose. Call your doctor or health care professional if you are unable to keep an appointment. What may interact with this medicine? This medicine may interact with the following medications:  aprepitant, fosaprepitant  certain medicines for fungal infections like fluconazole, itraconazole, and  ketoconazole  certain medicines for seizures like carbamazepine, phenobarbital, phenytoin  grapefruit, grapefruit juice  rifampin  [place] Wort This list may not describe all possible interactions. Give your health care provider a list of all the medicines, herbs, non-prescription drugs, or dietary supplements you use. Also tell them if you smoke, drink alcohol, or use illegal drugs. Some items may interact with your medicine. What should I watch for while using this medicine? Visit your doctor for checks on your progress. This drug may make you feel generally unwell. This is not uncommon, as chemotherapy can affect healthy cells as well as cancer cells. Report any side effects. Continue your course of treatment even though you feel ill unless your doctor tells you to stop. Drink water or other fluids as directed. Urinate often, even at night. In some cases, you may be given additional medicines to help with side effects. Follow all directions for their use. Call your doctor or health care professional for advice if you get a fever, chills or sore throat, or other symptoms of a cold or flu. Do not treat yourself. This drug decreases your body's ability to fight infections. Try to avoid being around people who are sick. This medicine may increase your risk to bruise or bleed. Call your doctor or health care professional if you notice any unusual bleeding. Be careful brushing and flossing your teeth or using a toothpick because you may get an infection or bleed more easily. If you have any dental work done, tell your dentist you are receiving this medicine. Avoid taking products that contain aspirin, acetaminophen, ibuprofen, naproxen, or ketoprofen unless  instructed by your doctor. These medicines may hide a fever. Do not become pregnant while taking this medicine. Women should inform their doctor if they wish to become pregnant or think they might be pregnant. Men should not father a child while  taking this medicine and for 6 months after stopping it. There is a potential for serious side effects to an unborn child. Talk to your health care professional or pharmacist for more information. Do not breast-feed an infant while taking this medicine. This medicine has caused ovarian failure in some women. This medicine may interfere with the ability to have a child. Talk with your doctor or health care professional if you are concerned about your fertility. This medicine has caused reduced sperm counts in some men. This may interfere with the ability to father a child. You should talk to your doctor or health care professional if you are concerned about your fertility. What side effects may I notice from receiving this medicine? Side effects that you should report to your doctor or health care professional as soon as possible:  allergic reactions like skin rash, itching or hives, swelling of the face, lips, or tongue  breathing problems  chest pain or palpitations  confusion  dizziness  hallucination, loss of contact with reality  loss of balance or coordination  nausea, vomiting  pain, tingling, numbness in the hands or feet  seizures  signs of decreased platelets or bleeding - bruising, pinpoint red spots on the skin, black, tarry stools, blood in the urine  signs of decreased red blood cells - unusually weak or tired, feeling faint or lightheaded, falls  signs and symptoms of infection like fever or chills; cough; sore throat; pain or trouble passing urine  signs and symptoms of kidney injury like trouble passing urine or change in the amount of urine  signs and symptoms of liver injury like dark yellow or brown urine; general ill feeling or flu-like symptoms; light-colored stools; loss of appetite; nausea; right upper belly pain; unusually weak or tired; yellowing of the eyes or skin  swelling of the legs or ankles  tremors Side effects that usually do not require  medical attention (report to your doctor or health care professional if they continue or are bothersome):  changes in vision  constipation  decreased hearing  diarrhea  hair loss  headache  loss of appetite  loss of memory  mouth sores  rash This list may not describe all possible side effects. Call your doctor for medical advice about side effects. You may report side effects to FDA at 1-800-FDA-1088. Where should I keep my medicine? This drug is given in a hospital or clinic and will not be stored at home. NOTE: This sheet is a summary. It may not cover all possible information. If you have questions about this medicine, talk to your doctor, pharmacist, or health care provider.  2020 [employer]/Gold Standard (2016-11-22 09:57:40) Gemcitabine injection What is this medicine? GEMCITABINE (jem SYE ta been) is a chemotherapy drug. This medicine is used to treat many types of cancer like breast cancer, lung cancer, pancreatic cancer, and ovarian cancer. This medicine may be used for other purposes; ask your health care provider or pharmacist if you have questions. COMMON BRAND NAME(S): Gemzar, Infugem What should I tell my health care provider before I take this medicine? They need to know if you have any of these conditions:  blood disorders  infection  kidney disease  liver disease  lung or breathing disease, like asthma  recent or ongoing radiation therapy  an unusual or allergic reaction to gemcitabine, other chemotherapy, other medicines, foods, dyes, or preservatives  pregnant or trying to get pregnant  breast-feeding How should I use this medicine? This drug is given as an infusion into a vein. It is administered in a hospital or clinic by a specially trained health care professional. Talk to your pediatrician regarding the use of this medicine in children. Special care may be needed. Overdosage: If you think you have taken too much of this medicine contact a  poison control center or emergency room at once. NOTE: This medicine is only for you. Do not share this medicine with others. What if I miss a dose? It is important not to miss your dose. Call your doctor or health care professional if you are unable to keep an appointment. What may interact with this medicine?  medicines to increase blood counts like filgrastim, pegfilgrastim, sargramostim  some other chemotherapy drugs like cisplatin  vaccines Talk to your doctor or health care professional before taking any of these medicines:  acetaminophen  aspirin  ibuprofen  ketoprofen  naproxen This list may not describe all possible interactions. Give your health care provider a list of all the medicines, herbs, non-prescription drugs, or dietary supplements you use. Also tell them if you smoke, drink alcohol, or use illegal drugs. Some items may interact with your medicine. What should I watch for while using this medicine? Visit your doctor for checks on your progress. This drug may make you feel generally unwell. This is not uncommon, as chemotherapy can affect healthy cells as well as cancer cells. Report any side effects. Continue your course of treatment even though you feel ill unless your doctor tells you to stop. In some cases, you may be given additional medicines to help with side effects. Follow all directions for their use. Call your doctor or health care professional for advice if you get a fever, chills or sore throat, or other symptoms of a cold or flu. Do not treat yourself. This drug decreases your body's ability to fight infections. Try to avoid being around people who are sick. This medicine may increase your risk to bruise or bleed. Call your doctor or health care professional if you notice any unusual bleeding. Be careful brushing and flossing your teeth or using a toothpick because you may get an infection or bleed more easily. If you have any dental work done, tell your  dentist you are receiving this medicine. Avoid taking products that contain aspirin, acetaminophen, ibuprofen, naproxen, or ketoprofen unless instructed by your doctor. These medicines may hide a fever. Do not become pregnant while taking this medicine or for 6 months after stopping it. Women should inform their doctor if they wish to become pregnant or think they might be pregnant. Men should not father a child while taking this medicine and for 3 months after stopping it. There is a potential for serious side effects to an unborn child. Talk to your health care professional or pharmacist for more information. Do not breast-feed an infant while taking this medicine or for at least 1 week after stopping it. Men should inform their doctors if they wish to father a child. This medicine may lower sperm counts. Talk with your doctor or health care professional if you are concerned about your fertility. What side effects may I notice from receiving this medicine? Side effects that you should report to your doctor or health care professional as soon as possible:  allergic reactions like skin rash, itching or hives, swelling of the face, lips, or tongue  breathing problems  pain, redness, or irritation at site where injected  signs and symptoms of a dangerous change in heartbeat or heart rhythm like chest pain; dizziness; fast or irregular heartbeat; palpitations; feeling faint or lightheaded, falls; breathing problems  signs of decreased platelets or bleeding - bruising, pinpoint red spots on the skin, black, tarry stools, blood in the urine  signs of decreased red blood cells - unusually weak or tired, feeling faint or lightheaded, falls  signs of infection - fever or chills, cough, sore throat, pain or difficulty passing urine  signs and symptoms of kidney injury like trouble passing urine or change in the amount of urine  signs and symptoms of liver injury like dark yellow or brown urine; general  ill feeling or flu-like symptoms; light-colored stools; loss of appetite; nausea; right upper belly pain; unusually weak or tired; yellowing of the eyes or skin  swelling of ankles, feet, hands Side effects that usually do not require medical attention (report to your doctor or health care professional if they continue or are bothersome):  constipation  diarrhea  hair loss  loss of appetite  nausea  rash  vomiting This list may not describe all possible side effects. Call your doctor for medical advice about side effects. You may report side effects to FDA at 1-800-FDA-1088. Where should I keep my medicine? This drug is given in a hospital or clinic and will not be stored at home. NOTE: This sheet is a summary. It may not cover all possible information. If you have questions about this medicine, talk to your doctor, pharmacist, or health care provider.  2020 [employer]/Gold Standard (2017-06-22 18:06:11)

## 2019-10-02 NOTE — Progress Notes (Signed)
Hematology and Oncology Follow Up Visit  BRYDEN DARDEN [id number] 1951-01-29 69 y.o. 10/02/2019   Principle Diagnosis:  Metastatic high-grade bladder cancer - recurrent Diverticular abscess - E.coli  Past Therapy: Atezolizumab 1200mg  IV q 3 wks - s/p cycle #4 - d/c due to progression. Taxotere 80mg /m2 IV q 3 wks - s/p cycle #3 - d/c due to progression M-VAC - s/p cycle#8 -- d/c on 07/10/2018 due to blood counts Padcev -- s/p cycle #1 on 07/17/2018 d/c 08/28/2018  Current Therapy: Ifosfamide/Gemzar -- s/p cycle #6- started 04/16/2019 IV iron as indicated    Interim History:  Mr. Dietrick is here today for follow-up.  He looks and feels better.  I think a lot of this has to do with the atrial fibrillation being converted.  He underwent electrical cardioversion.  He now is on Tikosyn.  He has had no problems with the Tikosyn.  His biggest problem clearly has been the neuropathy.  He is on gabapentin.  This is affecting his renal function.  We will try him on Cymbalta.  We will try him on 60 mg a day.  He will then taper down off the gabapentin.  He has had no problems with nausea or vomiting.  He has had no pain.  There is no change in bowel or bladder habits.  He has had no hematuria.  He has had some urinary frequency.  There is a little bit of swelling in his legs but this seems to be a little bit better with the heart rate back in in sinus rhythm.  We did check his iron studies today.  His ferritin is quite high because of inflammation.  His iron saturation is only 28%.    Overall, I would have to say his performance status is ECOG 1.  Medications:  Allergies as of 10/02/2019   No Known Allergies     Medication List       Accurate as of October 02, 2019  9:13 AM. If you have any questions, ask your nurse or doctor.        acetaminophen 500 MG tablet Commonly known as: TYLENOL Take 1,000 mg by mouth every 6 (six) hours as needed for mild pain or headache.     amoxicillin 500 MG capsule Commonly known as: AMOXIL TAKE 4 CAPS (2,000 MG TOTAL) BY MOUTH ONCE FOR 1 DOSE. TAKE 1 HOUR PRIOR TO DENTAL PROCEDURE What changed: See the new instructions.   apixaban 5 MG Tabs tablet Commonly known as: Eliquis Take 1 tablet (5 mg total) by mouth 2 (two) times daily.   atorvastatin 40 MG tablet Commonly known as: LIPITOR Take 40 mg by mouth every evening.   dexamethasone 4 MG tablet Commonly known as: DECADRON Take 2 tablets (8 mg total) by mouth daily. Start the day after chemotherapy for 2 days. Take with food.   diltiazem 360 MG 24 hr capsule Commonly known as: CARDIZEM CD TAKE 1 CAPSULE BY MOUTH EVERY DAY What changed: how much to take   dofetilide 250 MCG capsule Commonly known as: TIKOSYN Take 1 capsule (250 mcg total) by mouth 2 (two) times daily.   gabapentin 400 MG capsule Commonly known as: NEURONTIN TAKE 1 CAPSULE (400 MG TOTAL) BY MOUTH 4 (FOUR) TIMES DAILY. What changed:   how much to take  when to take this   LORazepam 0.5 MG tablet Commonly known as: Ativan Take 1 tablet (0.5 mg total) by mouth every 6 (six) hours as needed (Nausea or vomiting). What changed:  reasons to take this   magnesium oxide 400 MG tablet Commonly known as: MAG-OX Take 1 tablet (400 mg total) by mouth 2 (two) times daily. What changed:   how much to take  when to take this  additional instructions   metoprolol succinate 50 MG 24 hr tablet Commonly known as: TOPROL-XL Take 1 tablet (50 mg total) by mouth in the morning and at bedtime.   prochlorperazine 10 MG tablet Commonly known as: COMPAZINE Take 1 tablet (10 mg total) by mouth every 6 (six) hours as needed (Nausea or vomiting). What changed: reasons to take this   pyridoxine 100 MG tablet Commonly known as: B-6 Take 200 mg by mouth daily with breakfast.   traMADol 50 MG tablet Commonly known as: ULTRAM Take 50 mg by mouth daily as needed (for pain).   VITAMIN D-3 PO Take 1  capsule by mouth daily with breakfast.       Allergies: No Known Allergies  Past Medical History, Surgical history, Social history, and Family History were reviewed and updated.  Review of Systems: Review of Systems  Constitutional: Positive for malaise/fatigue.  HENT: Negative.   Eyes: Negative.   Respiratory: Positive for shortness of breath.   Cardiovascular: Positive for palpitations.  Genitourinary: Positive for hematuria.  Musculoskeletal: Negative.   Skin: Negative.   Neurological: Negative.   Endo/Heme/Allergies: Negative.   Psychiatric/Behavioral: Negative.      Physical Exam:  weight is 274 lb (124.3 kg). His temporal temperature is 97.5 F (36.4 C) (abnormal). His blood pressure is 128/77 and his pulse is 78. His respiration is 19 and oxygen saturation is 96%.   Wt Readings from Last 3 Encounters:  10/02/19 274 lb (124.3 kg)  09/28/19 270 lb 8 oz (122.7 kg)  09/25/19 272 lb 12.8 oz (123.7 kg)    Physical Exam Vitals reviewed.  HENT:     Head: Normocephalic and atraumatic.  Eyes:     Pupils: Pupils are equal, round, and reactive to light.  Cardiovascular:     Rate and Rhythm: Normal rate and regular rhythm.     Heart sounds: Normal heart sounds.     Comments: Cardiac exam shows a regular rate and rhythm.  I do not detect any rhythm consistent with atrial fibrillation.  He has a 1/6 systolic ejection murmur.   Pulmonary:     Effort: Pulmonary effort is normal.     Breath sounds: Normal breath sounds.  Abdominal:     General: Bowel sounds are normal.     Palpations: Abdomen is soft.  Musculoskeletal:        General: No tenderness or deformity. Normal range of motion.     Cervical back: Normal range of motion.  Lymphadenopathy:     Cervical: No cervical adenopathy.  Skin:    General: Skin is warm and dry.     Findings: No erythema or rash.  Neurological:     Mental Status: He is alert and oriented to person, place, and time.  Psychiatric:         Behavior: Behavior normal.        Thought Content: Thought content normal.        Judgment: Judgment normal.      Lab Results  Component Value Date   WBC 7.7 10/02/2019   HGB 10.8 (L) 10/02/2019   HCT 34.3 (L) 10/02/2019   MCV 97.7 10/02/2019   PLT 135 (L) 10/02/2019   Lab Results  Component Value Date   FERRITIN 1,983 (H) 09/18/2019  IRON 60 09/18/2019   TIBC 248 09/18/2019   UIBC 188 09/18/2019   IRONPCTSAT 24 09/18/2019   Lab Results  Component Value Date   RETICCTPCT 2.4 10/02/2019   RBC 3.51 (L) 10/02/2019   RBC 3.52 (L) 10/02/2019   No results found for: KPAFRELGTCHN, LAMBDASER, KAPLAMBRATIO No results found for: IGGSERUM, IGA, IGMSERUM No results found for: [name], SPEI   Chemistry      Component Value Date/Time   NA 137 09/28/2019 0504   NA 140 05/15/2019 1109   NA 141 02/21/2017 0902   K 4.4 09/28/2019 0504   K 4.1 02/21/2017 0902   CL 100 09/28/2019 0504   CL 98 02/21/2017 0902   CO2 29 09/28/2019 0504   CO2 31 02/21/2017 0902   BUN 27 (H) 09/28/2019 0504   BUN 36 (H) 05/15/2019 1109   BUN 17 02/21/2017 0902   CREATININE 2.48 (H) 09/28/2019 0504   CREATININE 2.41 (H) 09/18/2019 0820   CREATININE 1.2 02/21/2017 0902      Component Value Date/Time   CALCIUM 8.8 (L) 09/28/2019 0504   CALCIUM 9.2 02/21/2017 0902   ALKPHOS 61 09/18/2019 0820   ALKPHOS 65 02/21/2017 0902   AST 23 09/18/2019 0820   ALT 17 09/18/2019 0820   ALT 18 02/21/2017 0902   BILITOT 0.5 09/18/2019 0820       Impression and Plan: Mr. Pewitt is a very pleasant 69 yo caucasian gentleman with metastatic high grade bladder cancer.   Hopefully, he will stay in sinus rhythm.  I think this will clearly make him feel better and give him more energy and stamina.  Maybe, the Cymbalta will help with the neuropathy.  I just feel bad that this is really limiting his quality of life.  We will go ahead and finally treat him  now.  His father close to month since he had treatment.  We really cannot treat him because he was just feeling so poorly and this was mostly from the atrial fibrillation.  Thankfully, his last PET scan that was done in May did not show any obvious metastatic disease.  We will go ahead with treatment.  We will plan to get him back in another month.      Volanda Napoleon, MD 6/22/20219:13 AM

## 2019-10-03 ENCOUNTER — Inpatient Hospital Stay: Payer: PPO

## 2019-10-03 VITALS — BP 92/65 | HR 85 | Temp 97.5°F | Resp 18

## 2019-10-03 DIAGNOSIS — C772 Secondary and unspecified malignant neoplasm of intra-abdominal lymph nodes: Secondary | ICD-10-CM

## 2019-10-03 DIAGNOSIS — Z5111 Encounter for antineoplastic chemotherapy: Secondary | ICD-10-CM | POA: Diagnosis not present

## 2019-10-03 MED ORDER — SODIUM CHLORIDE 0.9 % IV SOLN
150.0000 mg | Freq: Once | INTRAVENOUS | Status: AC
  Administered 2019-10-03: 150 mg via INTRAVENOUS
  Filled 2019-10-03: qty 5

## 2019-10-03 MED ORDER — SODIUM CHLORIDE 0.9 % IV SOLN
400.0000 mg/m2 | Freq: Once | INTRAVENOUS | Status: AC
  Administered 2019-10-03: 1000 mg via INTRAVENOUS
  Filled 2019-10-03: qty 10

## 2019-10-03 MED ORDER — HEPARIN SOD (PORK) LOCK FLUSH 100 UNIT/ML IV SOLN
500.0000 [IU] | Freq: Once | INTRAVENOUS | Status: AC | PRN
  Administered 2019-10-03: 500 [IU]
  Filled 2019-10-03: qty 5

## 2019-10-03 MED ORDER — SODIUM CHLORIDE 0.9 % IV SOLN
10.0000 mg | Freq: Once | INTRAVENOUS | Status: AC
  Administered 2019-10-03: 10 mg via INTRAVENOUS
  Filled 2019-10-03: qty 10

## 2019-10-03 MED ORDER — SODIUM CHLORIDE 0.9% FLUSH
10.0000 mL | INTRAVENOUS | Status: DC | PRN
  Administered 2019-10-03: 10 mL
  Filled 2019-10-03: qty 10

## 2019-10-03 MED ORDER — SODIUM CHLORIDE 0.9 % IV SOLN
INTRAVENOUS | Status: DC
  Filled 2019-10-03: qty 250

## 2019-10-03 MED ORDER — SODIUM CHLORIDE 0.9 % IV SOLN
Freq: Once | INTRAVENOUS | Status: AC
  Filled 2019-10-03: qty 60

## 2019-10-03 MED ORDER — SODIUM CHLORIDE 0.9 % IV SOLN
Freq: Once | INTRAVENOUS | Status: AC
  Filled 2019-10-03: qty 250

## 2019-10-03 NOTE — Patient Instructions (Signed)
Ifosfamide injection What is this medicine? IFOSFAMIDE (eye FOS fa mide) is a chemotherapy drug. It slows the growth of cancer cells. This medicine is used to treat testicular cancer and other cancers. This medicine may be used for other purposes; ask your health care provider or pharmacist if you have questions. COMMON BRAND NAME(S): Ifex, Ifex/Mesna What should I tell my health care provider before I take this medicine? They need to know if you have any of these conditions:  heart disease  history of irregular heartbeat  immune system problems  infection (especially a virus infection such as chickenpox, cold sores, or herpes)  infections in the bladder, kidneys, or urinary tract  kidney disease  low blood counts, like low white cell, platelet, or red cell counts  lung or breathing disease, like asthma  problems urinating  recent or ongoing radiation therapy  tingling of the fingers or toes, or other nerve disorder  an unusual or allergic reaction to ifosfamide, other medicines, foods, dyes, or preservatives  pregnant or trying to get pregnant  breast-feeding How should I use this medicine? This drug is given as an infusion into a vein. It is administered in a hospital or clinic by a specially trained health care professional. Talk to your pediatrician regarding the use of this medicine in children. Special care may be needed. Overdosage: If you think you have taken too much of this medicine contact a poison control center or emergency room at once. NOTE: This medicine is only for you. Do not share this medicine with others. What if I miss a dose? It is important not to miss your dose. Call your doctor or health care professional if you are unable to keep an appointment. What may interact with this medicine? This medicine may interact with the following medications:  aprepitant, fosaprepitant  certain medicines for fungal infections like fluconazole, itraconazole, and  ketoconazole  certain medicines for seizures like carbamazepine, phenobarbital, phenytoin  grapefruit, grapefruit juice  rifampin  [place] Wort This list may not describe all possible interactions. Give your health care provider a list of all the medicines, herbs, non-prescription drugs, or dietary supplements you use. Also tell them if you smoke, drink alcohol, or use illegal drugs. Some items may interact with your medicine. What should I watch for while using this medicine? Visit your doctor for checks on your progress. This drug may make you feel generally unwell. This is not uncommon, as chemotherapy can affect healthy cells as well as cancer cells. Report any side effects. Continue your course of treatment even though you feel ill unless your doctor tells you to stop. Drink water or other fluids as directed. Urinate often, even at night. In some cases, you may be given additional medicines to help with side effects. Follow all directions for their use. Call your doctor or health care professional for advice if you get a fever, chills or sore throat, or other symptoms of a cold or flu. Do not treat yourself. This drug decreases your body's ability to fight infections. Try to avoid being around people who are sick. This medicine may increase your risk to bruise or bleed. Call your doctor or health care professional if you notice any unusual bleeding. Be careful brushing and flossing your teeth or using a toothpick because you may get an infection or bleed more easily. If you have any dental work done, tell your dentist you are receiving this medicine. Avoid taking products that contain aspirin, acetaminophen, ibuprofen, naproxen, or ketoprofen unless  instructed by your doctor. These medicines may hide a fever. Do not become pregnant while taking this medicine. Women should inform their doctor if they wish to become pregnant or think they might be pregnant. Men should not father a child while  taking this medicine and for 6 months after stopping it. There is a potential for serious side effects to an unborn child. Talk to your health care professional or pharmacist for more information. Do not breast-feed an infant while taking this medicine. This medicine has caused ovarian failure in some women. This medicine may interfere with the ability to have a child. Talk with your doctor or health care professional if you are concerned about your fertility. This medicine has caused reduced sperm counts in some men. This may interfere with the ability to father a child. You should talk to your doctor or health care professional if you are concerned about your fertility. What side effects may I notice from receiving this medicine? Side effects that you should report to your doctor or health care professional as soon as possible:  allergic reactions like skin rash, itching or hives, swelling of the face, lips, or tongue  breathing problems  chest pain or palpitations  confusion  dizziness  hallucination, loss of contact with reality  loss of balance or coordination  nausea, vomiting  pain, tingling, numbness in the hands or feet  seizures  signs of decreased platelets or bleeding - bruising, pinpoint red spots on the skin, black, tarry stools, blood in the urine  signs of decreased red blood cells - unusually weak or tired, feeling faint or lightheaded, falls  signs and symptoms of infection like fever or chills; cough; sore throat; pain or trouble passing urine  signs and symptoms of kidney injury like trouble passing urine or change in the amount of urine  signs and symptoms of liver injury like dark yellow or brown urine; general ill feeling or flu-like symptoms; light-colored stools; loss of appetite; nausea; right upper belly pain; unusually weak or tired; yellowing of the eyes or skin  swelling of the legs or ankles  tremors Side effects that usually do not require  medical attention (report to your doctor or health care professional if they continue or are bothersome):  changes in vision  constipation  decreased hearing  diarrhea  hair loss  headache  loss of appetite  loss of memory  mouth sores  rash This list may not describe all possible side effects. Call your doctor for medical advice about side effects. You may report side effects to FDA at 1-800-FDA-1088. Where should I keep my medicine? This drug is given in a hospital or clinic and will not be stored at home. NOTE: This sheet is a summary. It may not cover all possible information. If you have questions about this medicine, talk to your doctor, pharmacist, or health care provider.  2020 Elsevier/Gold Standard (2016-11-22 09:57:40) Mesna injection What is this medicine? MESNA (MES na) is used to prevent bleeding from the bladder during treatment with ifosfamide. This medicine does not reduce the chance of getting other side effects of cancer chemotherapy. This medicine may be used for other purposes; ask your health care provider or pharmacist if you have questions. COMMON BRAND NAME(S): Mesnex What should I tell my health care provider before I take this medicine? They need to know if you have any of these conditions:  autoimmune disease like lupus, nephritis, or rheumatoid arthritis  an unusual or allergic reaction to mesna, benzyl  alcohol, sulfur medicines, other medicines, foods, dyes, or preservatives  pregnant or trying to get pregnant  breast-feeding How should I use this medicine? This medicine is for infusion into a vein. It is given by a health care professional in a hospital or clinic setting. Talk to your pediatrician regarding the use of this medicine in children. Special care may be needed. Overdosage: If you think you have taken too much of this medicine contact a poison control center or emergency room at once. NOTE: This medicine is only for you. Do not  share this medicine with others. What if I miss a dose? This does not apply. What may interact with this medicine? Interactions are not expected. This list may not describe all possible interactions. Give your health care provider a list of all the medicines, herbs, non-prescription drugs, or dietary supplements you use. Also tell them if you smoke, drink alcohol, or use illegal drugs. Some items may interact with your medicine. What should I watch for while using this medicine? Your doctor will follow your condition closely while you are taking this medicine. Tell your doctor right away if you see that your urine has turned a pink or red color. It is important to drink at least a quart (4 cups) of fluids each day that you take this medicine. This medicine may cause serious skin reactions. They can happen weeks to months after starting the medicine. Contact your health care provider right away if you notice fevers or flu-like symptoms with a rash. The rash may be red or purple and then turn into blisters or peeling of the skin. Or, you might notice a red rash with swelling of the face, lips or lymph nodes in your neck or under your arms. What side effects may I notice from receiving this medicine? Side effects that you should report to your doctor or health care professional as soon as possible:  allergic reactions like skin rash, itching or hives, swelling of the face, lips, or tongue  breathing problems  blood in your urine or pink to red colored urine  fever, chills, or sore throat  flushing or redness to skin  mouth sores  pain or redness at site where injected  redness, blistering, peeling or loosening of the skin, including inside the mouth  swelling of ankles or feet  vomiting Side effects that usually do not require medical attention (report to your doctor or health care professional if they continue or are bothersome):  aches and pains  bad taste in  mouth  diarrhea  dizziness  hair loss  headache  nausea This list may not describe all possible side effects. Call your doctor for medical advice about side effects. You may report side effects to FDA at 1-800-FDA-1088. Where should I keep my medicine? This drug is given in a hospital or clinic and will not be stored at home. NOTE: This sheet is a summary. It may not cover all possible information. If you have questions about this medicine, talk to your doctor, pharmacist, or health care provider.  2020 Elsevier/Gold Standard (2018-06-30 15:28:46)

## 2019-10-04 ENCOUNTER — Inpatient Hospital Stay: Payer: PPO

## 2019-10-04 ENCOUNTER — Other Ambulatory Visit: Payer: Self-pay | Admitting: *Deleted

## 2019-10-04 ENCOUNTER — Inpatient Hospital Stay (HOSPITAL_BASED_OUTPATIENT_CLINIC_OR_DEPARTMENT_OTHER): Payer: PPO

## 2019-10-04 ENCOUNTER — Other Ambulatory Visit: Payer: Self-pay

## 2019-10-04 DIAGNOSIS — C772 Secondary and unspecified malignant neoplasm of intra-abdominal lymph nodes: Secondary | ICD-10-CM

## 2019-10-04 DIAGNOSIS — D500 Iron deficiency anemia secondary to blood loss (chronic): Secondary | ICD-10-CM

## 2019-10-04 DIAGNOSIS — C679 Malignant neoplasm of bladder, unspecified: Secondary | ICD-10-CM

## 2019-10-04 DIAGNOSIS — Z5111 Encounter for antineoplastic chemotherapy: Secondary | ICD-10-CM | POA: Diagnosis not present

## 2019-10-04 LAB — BASIC METABOLIC PANEL - CANCER CENTER ONLY
Anion gap: 7 (ref 5–15)
BUN: 39 mg/dL — ABNORMAL HIGH (ref 8–23)
CO2: 27 mmol/L (ref 22–32)
Calcium: 8.7 mg/dL — ABNORMAL LOW (ref 8.9–10.3)
Chloride: 102 mmol/L (ref 98–111)
Creatinine: 1.96 mg/dL — ABNORMAL HIGH (ref 0.61–1.24)
GFR, Est AFR Am: 40 mL/min — ABNORMAL LOW (ref >60)
GFR, Estimated: 34 mL/min — ABNORMAL LOW (ref >60)
Glucose, Bld: 227 mg/dL — ABNORMAL HIGH (ref 70–99)
Potassium: 3.9 mmol/L (ref 3.5–5.1)
Sodium: 136 mmol/L (ref 135–145)

## 2019-10-04 MED ORDER — POTASSIUM CHLORIDE CRYS ER 20 MEQ PO TBCR
40.0000 meq | EXTENDED_RELEASE_TABLET | Freq: Once | ORAL | Status: AC
  Administered 2019-10-04: 40 meq via ORAL
  Filled 2019-10-04: qty 2

## 2019-10-04 MED ORDER — SODIUM CHLORIDE 0.9 % IV SOLN
Freq: Once | INTRAVENOUS | Status: AC
  Filled 2019-10-04: qty 250

## 2019-10-04 MED ORDER — SODIUM CHLORIDE 0.9% FLUSH
10.0000 mL | INTRAVENOUS | Status: DC | PRN
  Administered 2019-10-04: 10 mL
  Filled 2019-10-04: qty 10

## 2019-10-04 MED ORDER — HEPARIN SOD (PORK) LOCK FLUSH 100 UNIT/ML IV SOLN
500.0000 [IU] | Freq: Once | INTRAVENOUS | Status: AC | PRN
  Administered 2019-10-04: 500 [IU]
  Filled 2019-10-04: qty 5

## 2019-10-04 NOTE — Progress Notes (Signed)
10/04/2019 [id number] Presents for chemo, appears to be dyspneic, tremors in both arms present. Pulse irregular, has history of AFIB, cardioverted 09-27-19.  [name] notified, at bedside. EKG, BMET drawn. No other complaints. 10:42 AM No treatment today per [name]. Oral K given. No ON Pro today. To return to AFIB clinic tomorrow.

## 2019-10-04 NOTE — Patient Instructions (Signed)

## 2019-10-05 ENCOUNTER — Ambulatory Visit (HOSPITAL_BASED_OUTPATIENT_CLINIC_OR_DEPARTMENT_OTHER)
Admission: RE | Admit: 2019-10-05 | Discharge: 2019-10-05 | Disposition: A | Discharge status: Home / Self Care | Payer: PPO | Source: Ambulatory Visit | Attending: Nurse Practitioner | Admitting: Nurse Practitioner

## 2019-10-05 ENCOUNTER — Encounter (HOSPITAL_COMMUNITY): Payer: Self-pay | Admitting: Nurse Practitioner

## 2019-10-05 ENCOUNTER — Ambulatory Visit (HOSPITAL_COMMUNITY)
Admit: 2019-10-05 | Discharge: 2019-10-05 | Disposition: A | Discharge status: Home / Self Care | Payer: PPO | Attending: Nurse Practitioner | Admitting: Nurse Practitioner

## 2019-10-05 VITALS — BP 126/66 | HR 69 | Ht 72.0 in | Wt 276.8 lb

## 2019-10-05 DIAGNOSIS — I493 Ventricular premature depolarization: Secondary | ICD-10-CM | POA: Insufficient documentation

## 2019-10-05 DIAGNOSIS — Z96642 Presence of left artificial hip joint: Secondary | ICD-10-CM | POA: Insufficient documentation

## 2019-10-05 DIAGNOSIS — I4819 Other persistent atrial fibrillation: Secondary | ICD-10-CM | POA: Insufficient documentation

## 2019-10-05 DIAGNOSIS — Z7901 Long term (current) use of anticoagulants: Secondary | ICD-10-CM | POA: Insufficient documentation

## 2019-10-05 DIAGNOSIS — M1990 Unspecified osteoarthritis, unspecified site: Secondary | ICD-10-CM | POA: Diagnosis not present

## 2019-10-05 DIAGNOSIS — I129 Hypertensive chronic kidney disease with stage 1 through stage 4 chronic kidney disease, or unspecified chronic kidney disease: Secondary | ICD-10-CM | POA: Insufficient documentation

## 2019-10-05 DIAGNOSIS — Z96653 Presence of artificial knee joint, bilateral: Secondary | ICD-10-CM | POA: Insufficient documentation

## 2019-10-05 DIAGNOSIS — Z87891 Personal history of nicotine dependence: Secondary | ICD-10-CM | POA: Insufficient documentation

## 2019-10-05 DIAGNOSIS — Z8546 Personal history of malignant neoplasm of prostate: Secondary | ICD-10-CM | POA: Diagnosis not present

## 2019-10-05 DIAGNOSIS — E785 Hyperlipidemia, unspecified: Secondary | ICD-10-CM | POA: Diagnosis not present

## 2019-10-05 DIAGNOSIS — C679 Malignant neoplasm of bladder, unspecified: Secondary | ICD-10-CM | POA: Insufficient documentation

## 2019-10-05 DIAGNOSIS — Z79899 Other long term (current) drug therapy: Secondary | ICD-10-CM | POA: Insufficient documentation

## 2019-10-05 DIAGNOSIS — Z792 Long term (current) use of antibiotics: Secondary | ICD-10-CM | POA: Diagnosis not present

## 2019-10-05 DIAGNOSIS — D6869 Other thrombophilia: Secondary | ICD-10-CM

## 2019-10-05 DIAGNOSIS — I440 Atrioventricular block, first degree: Secondary | ICD-10-CM | POA: Diagnosis not present

## 2019-10-05 DIAGNOSIS — C772 Secondary and unspecified malignant neoplasm of intra-abdominal lymph nodes: Secondary | ICD-10-CM | POA: Insufficient documentation

## 2019-10-05 DIAGNOSIS — N183 Chronic kidney disease, stage 3 unspecified: Secondary | ICD-10-CM | POA: Diagnosis not present

## 2019-10-05 DIAGNOSIS — R7303 Prediabetes: Secondary | ICD-10-CM | POA: Insufficient documentation

## 2019-10-05 DIAGNOSIS — D649 Anemia, unspecified: Secondary | ICD-10-CM | POA: Insufficient documentation

## 2019-10-05 DIAGNOSIS — I480 Paroxysmal atrial fibrillation: Secondary | ICD-10-CM | POA: Diagnosis not present

## 2019-10-05 DIAGNOSIS — E7800 Pure hypercholesterolemia, unspecified: Secondary | ICD-10-CM | POA: Diagnosis not present

## 2019-10-05 DIAGNOSIS — I10 Essential (primary) hypertension: Secondary | ICD-10-CM | POA: Diagnosis not present

## 2019-10-05 LAB — BASIC METABOLIC PANEL
Anion gap: 9 (ref 5–15)
BUN: 42 mg/dL — ABNORMAL HIGH (ref 8–23)
CO2: 26 mmol/L (ref 22–32)
Calcium: 8.6 mg/dL — ABNORMAL LOW (ref 8.9–10.3)
Chloride: 101 mmol/L (ref 98–111)
Creatinine, Ser: 1.99 mg/dL — ABNORMAL HIGH (ref 0.61–1.24)
GFR calc Af Amer: 39 mL/min — ABNORMAL LOW (ref >60)
GFR calc non Af Amer: 34 mL/min — ABNORMAL LOW (ref >60)
Glucose, Bld: 170 mg/dL — ABNORMAL HIGH (ref 70–99)
Potassium: 4.2 mmol/L (ref 3.5–5.1)
Sodium: 136 mmol/L (ref 135–145)

## 2019-10-05 LAB — MAGNESIUM: Magnesium: 2.1 mg/dL (ref 1.7–2.4)

## 2019-10-05 LAB — ECHOCARDIOGRAM COMPLETE
Height: 72 in
Weight: 4428.8 oz

## 2019-10-05 MED ORDER — MAGNESIUM OXIDE 400 MG PO TABS: 400.0000 mg | ORAL_TABLET | Freq: Two times a day (BID) | ORAL | Status: DC

## 2019-10-05 NOTE — Progress Notes (Signed)
Primary Care Physician: Johny Blamer, MD Referring Physician: Dr. Rennis Golden Cardiologist: Dr. Rennis Golden  Oncologist : Dr. Myna Hidalgo Urologist: Dr. Yetta Numbers is a 69 y.o. 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 Rennis Golden as pt had recent cardioversion with ERAF. In his note he suggested use of flecainide and recent stress test was low risk on review of Dr. Rennis Golden.   The pt does c/o of a lot of fatigue but has other issues as well contributing. He is rate controlled. He is on eliquis 5 mg bid for a CHA2DS2VASc Score of 2. Heis pending a bladder resection on 4/26. His last creatinine was 2.11.  Pt had pending resection of bladder pending 4/26. His BB was increased prior to surgery to have better rate control. It was not an appropriate time to try to restore SR as I am sure chemical or electrical cardioversion would have been needed and that meant he would have to delay surgery as he would not be able to come off anticoagulation x 4 weeks. He would not have been able to use flecainide as it interfered with 4 of his medicines.   He returns today, 08/15/19 as physical therapy found his HR increased too much with activity and he  paused therapy. He is rate controlled today at 92 mbp. His BP is soft at 106/64, unable to increase rate control for this finding. He started back on anticoagulation right after his bladder resection 4/26 but had to stop it the following  Monday as he had increase hematuria. Hematuria  is improved off anticoagulation. He is c/o of being short of breath with exertion and weak, more so since his procedure. Marland Kitchen He feels terrible. He rolled out of bed last pm hitting his left eye on the bed stand and scraping his  rt knee. He was aware of his surroundings when he hit the floor. No  LOC. His hgb was found to be 7.5 and urology arranged for an outpt transfusion.   He is now  returned to afib clinic, 6/1,  to discuss tikosyn admit. Marland Kitchen He is feeling well better since infusion. He has seen minimal hematuria. He continues with fatigue and exertional dyspnea. He chemo was put on hold 2 weeks ago as he felt poorly. He feels it will resume next week. He remains in rate control afib. He has received both covid shots.   F/u in afib clinic for tikosyn admit, 6/15. He was to have his chemo last week but the oncologist said that he would delay until after Tikosyn load. No benadryl or no missed  DOAC for 3 weeks. No significant hematuria.   F/u in afib clinic, 6/25. He is here one week s/p Tikosyn. He had steroids and chemo on  Tues/Wed  but when he went on Thursday, he was noted to have bigeminy PVC's and chemo was not given. He was in rhythm. He does feel better in SR. He can walk further without any shortness of breath. The PVC's have been present before Tikosyn start and he was often in bigeminy PVC's while in the hospital, qtc is stable. I discussed with Dr.Allred, he suggested an updated echo but otherwise is not overly concerned as they were there  prior to tikosyn.   Today, he denies symptoms of palpitations, chest pain, shortness of breath, orthopnea, PND, lower extremity edema, dizziness, presyncope, syncope, or neurologic sequela.++ fatigue.  The patient is tolerating medications without difficulties and is otherwise without complaint today.   Past Medical History:  Diagnosis Date  . Anemia   . Arthritis   . 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 ([place]) 09/29/2016  . Bladder tumor   . Chronic kidney disease    ckd stage 3, sees dr every 6 months, arf hemodialysis done x 2 2020  . Dyspnea    with exertion  . Dysrhythmia    atrial fib  . Essential hypertension 06/23/2018  . Goals of care, counseling/discussion 09/30/2016  . History of prostate cancer followed by pcp dr [name]-  per pt last PSA undetectable   dx 2008-- (Stage T1c, Gleason 3+3,  PSA 4.58,  vol 99cc)  s/p  radical prostatectomy (nerve sparing bilateral)   . Hyperglycemia 06/23/2018  . Hypertension   . Mild hyperlipidemia 06/23/2018  . Pre-diabetes   . Wears glasses    Past Surgical History:  Procedure Laterality Date  . CARDIOVERSION N/A 05/18/2019   Procedure: CARDIOVERSION;  Surgeon: Lelon Perla, MD;  Location: [facility] ENDOSCOPY;  Service: Cardiovascular;  Laterality: N/A;  . CARDIOVERSION N/A 09/27/2019   Procedure: CARDIOVERSION;  Surgeon: Donato Heinz, MD;  Location: [place];  Service: Cardiovascular;  Laterality: N/A;  . CATARACT EXTRACTION W/ INTRAOCULAR LENS  IMPLANT, BILATERAL Bilateral 2011  . [place]  . IR FLUORO GUIDE PORT INSERTION RIGHT  10/07/2016  . IR RADIOLOGIST EVAL & MGMT  01/05/2018  . IR US GUIDE VASC ACCESS RIGHT  10/07/2016  . KNEE ARTHROSCOPY Bilateral right 2006;  left 02-15-2007  . [place];  1990;  1983  . ROBOT ASSISTED LAPAROSCOPIC RADICAL PROSTATECTOMY  06/20/2006   bilateral nerve sparing  . TEE WITHOUT CARDIOVERSION N/A 06/27/2018   Procedure: TRANSESOPHAGEAL ECHOCARDIOGRAM (TEE);  Surgeon: Nigel Mormon, MD;  Location: [facility] ENDOSCOPY;  Service: Cardiovascular;  Laterality: N/A;  . TOTAL HIP ARTHROPLASTY Left 03/03/2015   Procedure: LEFT TOTAL HIP ARTHROPLASTY ANTERIOR APPROACH;  Surgeon: Dorna Leitz, MD;  Location: [place];  Service: Orthopedics;  Laterality: Left;  . TOTAL KNEE ARTHROPLASTY Bilateral left 08-13-2009;  right 12-26-2009  . TRANSURETHRAL RESECTION OF BLADDER TUMOR N/A 09/20/2016   Procedure: TRANSURETHRAL RESECTION OF BLADDER TUMOR (TURBT);  Surgeon: Franchot Gallo, MD;  Location: [facility];  Service: Urology;  Laterality: N/A;  . TRANSURETHRAL RESECTION OF BLADDER TUMOR WITH MITOMYCIN-C N/A 08/06/2019   Procedure: TRANSURETHRAL RESECTION OF BLADDER TUMOR;  Surgeon: Franchot Gallo, MD;  Location: [facility];  Service: Urology;   Laterality: N/A;    Current Outpatient Medications  Medication Sig Dispense Refill  . acetaminophen (TYLENOL) 500 MG tablet Take 1,000 mg by mouth every 6 (six) hours as needed for mild pain or headache.    Marland Kitchen amoxicillin (AMOXIL) 500 MG capsule TAKE 4 CAPS (2,000 MG TOTAL) BY MOUTH ONCE FOR 1 DOSE. TAKE 1 HOUR PRIOR TO DENTAL PROCEDURE (Patient taking differently: Take 2,000 mg by mouth See admin instructions. Take 2,000 mg by mouth one hour prior to dental procedures) 4 capsule 4  . apixaban (ELIQUIS) 5 MG TABS tablet Take 1 tablet (5 mg total) by mouth 2 (two) times daily. 60 tablet 6  . atorvastatin (LIPITOR) 40 MG tablet Take 40 mg by mouth every evening.     . Cholecalciferol (VITAMIN D-3 PO) Take 1 capsule by mouth daily with breakfast.    . dexamethasone (DECADRON) 4 MG tablet Take 2 tablets (8 mg total) by mouth  daily. Start the day after chemotherapy for 2 days. Take with food. 30 tablet 1  . diltiazem (CARDIZEM CD) 360 MG 24 hr capsule TAKE 1 CAPSULE BY MOUTH EVERY DAY (Patient taking differently: Take 360 mg by mouth daily. ) 90 capsule 1  . dofetilide (TIKOSYN) 250 MCG capsule Take 1 capsule (250 mcg total) by mouth 2 (two) times daily. 60 capsule 6  . DULoxetine (CYMBALTA) 60 MG capsule Take 1 capsule (60 mg total) by mouth daily. 30 capsule 3  . gabapentin (NEURONTIN) 400 MG capsule TAKE 1 CAPSULE (400 MG TOTAL) BY MOUTH 4 (FOUR) TIMES DAILY. (Patient taking differently: Take 800 mg by mouth daily. ) 360 capsule 2  . LORazepam (ATIVAN) 0.5 MG tablet Take 1 tablet (0.5 mg total) by mouth every 6 (six) hours as needed (Nausea or vomiting). (Patient taking differently: Take 0.5 mg by mouth every 6 (six) hours as needed (for nausea or vomiting). ) 30 tablet 0  . magnesium oxide (MAG-OX) 400 MG tablet Take 1 tablet (400 mg total) by mouth 2 (two) times daily.    . metoprolol succinate (TOPROL-XL) 50 MG 24 hr tablet Take 1 tablet (50 mg total) by mouth in the morning and at bedtime. 60  tablet 3  . prochlorperazine (COMPAZINE) 10 MG tablet Take 1 tablet (10 mg total) by mouth every 6 (six) hours as needed (Nausea or vomiting). (Patient taking differently: Take 10 mg by mouth every 6 (six) hours as needed (for nausea or vomiting). ) 30 tablet 1  . pyridoxine (B-6) 100 MG tablet Take 200 mg by mouth daily with breakfast.     . traMADol (ULTRAM) 50 MG tablet Take 50 mg by mouth daily as needed (for pain).      No current facility-administered medications for this encounter.   Facility-Administered Medications Ordered in Other Encounters  Medication Dose Route Frequency Provider Last Rate Last Admin  . sodium chloride flush (NS) 0.9 % injection 10 mL  10 mL Intravenous PRN [place], Holli Humbles, NP   10 mL at 02/21/17 1038  . sodium chloride flush (NS) 0.9 % injection 10 mL  10 mL Intravenous PRN Volanda Napoleon, MD   10 mL at 07/10/18 0844  . sodium chloride flush (NS) 0.9 % injection 10 mL  10 mL Intravenous PRN Eliezer Bottom, NP   10 mL at 09/18/19 7014    No Known Allergies  Social History   Socioeconomic History  . Marital status: Married    Spouse name: Not on file  . Number of children: Not on file  . Years of education: Not on file  . Highest education level: Not on file  Occupational History  . Not on file  Tobacco Use  . Smoking status: Former Smoker    Packs/day: 1.00    Years: 16.00    Pack years: 16.00    Types: Cigarettes    Quit date: 09/25/1984    Years since quitting: 35.0  . Smokeless tobacco: Never Used  Vaping Use  . Vaping Use: Never used  Substance and Sexual Activity  . Alcohol use: Yes    Comment: occasionally  . Drug use: Yes    Types: Marijuana    Comment: after chemo to help sleep  . Sexual activity: Not on file  Other Topics Concern  . Not on file  Social History Narrative  . Not on file   Social Determinants of Health   Financial Resource Strain:   . Difficulty of Paying Living Expenses:  Food Insecurity:   .  Worried About Charity fundraiser in the Last Year:   . Arboriculturist in the Last Year:   Transportation Needs:   . Film/video editor (Medical):   Marland Kitchen Lack of Transportation (Non-Medical):   Physical Activity:   . Days of Exercise per Week:   . Minutes of Exercise per Session:   Stress:   . Feeling of Stress :   Social Connections:   . Frequency of Communication with Friends and Family:   . Frequency of Social Gatherings with Friends and Family:   . Attends Religious Services:   . Active Member of Clubs or Organizations:   . Attends Archivist Meetings:   Marland Kitchen Marital Status:   Intimate Partner Violence:   . Fear of Current or Ex-Partner:   . Emotionally Abused:   Marland Kitchen Physically Abused:   . Sexually Abused:     Family History  Problem Relation Age of Onset  . Hypertension Mother   . Aneurysm Mother   . Emphysema Father   . Hypertension Father     ROS- All systems are reviewed and negative except as per the HPI above  Physical Exam: Vitals:   10/05/19 1109  BP: 126/66  Pulse: 69  Weight: 125.6 kg  Height: 6' (1.829 m)   Wt Readings from Last 3 Encounters:  10/05/19 125.6 kg  10/02/19 124.3 kg  09/28/19 122.7 kg    Labs: Lab Results  Component Value Date   NA 136 10/05/2019   K 4.2 10/05/2019   CL 101 10/05/2019   CO2 26 10/05/2019   GLUCOSE 170 (H) 10/05/2019   BUN 42 (H) 10/05/2019   CREATININE 1.99 (H) 10/05/2019   CALCIUM 8.6 (L) 10/05/2019   PHOS 3.2 10/14/2018   MG 2.1 10/05/2019   Lab Results  Component Value Date   INR 1.3 (H) 09/26/2019   Lab Results  Component Value Date   CHOL 159 10/05/2018   HDL 47 10/05/2018   LDLCALC 86 10/05/2018   TRIG 131 10/05/2018     GEN- The patient is well appearing, alert and oriented x 3 today.   Head- normocephalic, atraumatic Eyes-  Sclera clear, conjunctiva pink Ears- hearing intact Oropharynx- clear Neck- supple, no JVP Lymph- no cervical lymphadenopathy Lungs- Clear to ausculation  bilaterally, normal work of breathing Heart- irregular rate and rhythm(pvc's), no murmurs, rubs or gallops, PMI not laterally displaced GI- soft, NT, ND, + BS Extremities- no clubbing, cyanosis, or edema MS- no significant deformity or atrophy Skin- no rash or lesion Psych- euthymic mood, full affect Neuro- strength and sensation are intact  EKG-SR with first degree AV block with bigeminy PVC's pr int 216 ms, qrs int 94 ms, qtc 484 ms  Epic records reviewed  Echo-1. The left ventricle has normal systolic function, with an ejection  fraction of 55-60%. The cavity size was normal. Left ventricular diastolic  Doppler parameters are consistent with impaired relaxation.  2. The right ventricle has normal systolic function. The cavity was  mildly enlarged. There is no increase in right ventricular wall thickness.  3. Right atrial size was mildly dilated.  4. Moderate thickening of the aortic valve Mild calcification of the  aortic valve.  5. There is dilatation of the aortic root measuring 39 mm.  6. No vegetations detected.   Assessment and Plan: 1.  Persistent  Afib S/p tikosyn load and is rhythm , qtc stable   Continue  Metoprolol 50 mg bid  Aware of the price of the drug, he is in the do-nut hole and will get drug thru good RX Qt is acceptable Bmet/mag acceptable today   2. CHA2DS2VASc score of 2 Continue eliquis 5 mg bid   3. Bladder CA Per urology and oncology   4. PVC's  Present prior to Tikosyn start Discussed with Dr. Rayann Heman and he is not overly concerned Suggested updated echo, he  will have this done 1:15 pm today   Butch Penny C. Sindy Mccune, [facility] [address] [place], [place] [address] [phone]

## 2019-10-05 NOTE — Progress Notes (Signed)
  Echocardiogram 2D Echocardiogram has been performed.  Jannett Celestine 10/05/2019, 2:02 PM

## 2019-10-12 ENCOUNTER — Encounter (HOSPITAL_COMMUNITY): Payer: Self-pay | Admitting: *Deleted

## 2019-10-22 DIAGNOSIS — N390 Urinary tract infection, site not specified: Secondary | ICD-10-CM | POA: Diagnosis not present

## 2019-10-22 DIAGNOSIS — C678 Malignant neoplasm of overlapping sites of bladder: Secondary | ICD-10-CM | POA: Diagnosis not present

## 2019-10-23 ENCOUNTER — Encounter: Payer: Self-pay | Admitting: Hematology & Oncology

## 2019-10-26 DIAGNOSIS — G629 Polyneuropathy, unspecified: Secondary | ICD-10-CM | POA: Diagnosis not present

## 2019-10-26 DIAGNOSIS — I10 Essential (primary) hypertension: Secondary | ICD-10-CM | POA: Diagnosis not present

## 2019-10-26 DIAGNOSIS — C679 Malignant neoplasm of bladder, unspecified: Secondary | ICD-10-CM | POA: Diagnosis not present

## 2019-10-26 DIAGNOSIS — N183 Chronic kidney disease, stage 3 unspecified: Secondary | ICD-10-CM | POA: Diagnosis not present

## 2019-10-26 DIAGNOSIS — E7800 Pure hypercholesterolemia, unspecified: Secondary | ICD-10-CM | POA: Diagnosis not present

## 2019-10-26 DIAGNOSIS — I480 Paroxysmal atrial fibrillation: Secondary | ICD-10-CM | POA: Diagnosis not present

## 2019-10-26 DIAGNOSIS — R7303 Prediabetes: Secondary | ICD-10-CM | POA: Diagnosis not present

## 2019-10-26 DIAGNOSIS — M21372 Foot drop, left foot: Secondary | ICD-10-CM | POA: Diagnosis not present

## 2019-10-26 DIAGNOSIS — R7309 Other abnormal glucose: Secondary | ICD-10-CM | POA: Diagnosis not present

## 2019-10-28 NOTE — Progress Notes (Signed)
Cardiology Office Note Date:  10/29/2019  Patient ID:  [name] Rose, [name] Rose 10/10/1950, MRN [id number] PCP:  Shirline Frees, MD  Cardiologist:  [name] EP: Dr. Rayann Heman (new at his [facility] admission) Nephrology: [facility] (he can not recall the name of his MD)    Chief Complaint:  post tikosyn initiation  History of Present Illness: [name] Rose is a 69 y.o. male with history of HTN, CKD (III), prostate, bladder cancer (s/p TURP and TURBT x2) with mets to intra-abdominal lymph nodes on chemo, HLD, PVCs and persistent AFib.  He was started on Tikosyn for his AFib, discharged 09/28/2019. She was seen 10/05/2019 in the AFib clinic maintaining SR, and feeling better. He had steroids and chemo on  Tues/Wed  but when he went on Thursday, he was noted to have bigeminy PVC's and chemo was not given Discussed PVCs with Dr. Rayann Heman, recommended updating echo, though given they were present prior to the start of drug, not overly concerned.  Electrolytes and QT stable. TTE noted LVEF 55-60%, no WMA, mildly dilated LV, RV normal.  He is accompanied today by his wife.  He does not think he has had any Afib, generally does feel like he has better energy with normal rhythm. He denies any CP, SOB, no dizzy spells, near syncope or syncope.  No palpitations, he is unaware of his PVCs He has been resumed on his chemo Compazine is on his list of medicines, but he states as far as he recalls, has not used it even once this year.  No N/V.  He will occasionally see a small amount of blood after urinating, usually when he is doing chemo, this is not new, and unchanged.  No noted overt hematuria, no bleeding otherwise.    Past Medical History:  Diagnosis Date   Anemia    Arthritis    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 ([place]) 09/29/2016   Bladder tumor    Chronic kidney disease    ckd stage 3, sees dr every 6 months, arf hemodialysis done x 2 2020   Dyspnea    with  exertion   Dysrhythmia    atrial fib   Essential hypertension 06/23/2018   Goals of care, counseling/discussion 09/30/2016   History of prostate cancer followed by pcp dr [name]-  per pt last PSA undetectable   dx 2008-- (Stage T1c, Gleason 3+3,  PSA 4.58, vol 99cc)  s/p  radical prostatectomy (nerve sparing bilateral)    Hyperglycemia 06/23/2018   Hypertension    Mild hyperlipidemia 06/23/2018   Pre-diabetes    Wears glasses     Past Surgical History:  Procedure Laterality Date   CARDIOVERSION N/A 05/18/2019   Procedure: CARDIOVERSION;  Surgeon: Lelon Perla, MD;  Location: [place];  Service: Cardiovascular;  Laterality: N/A;   CARDIOVERSION N/A 09/27/2019   Procedure: CARDIOVERSION;  Surgeon: Donato Heinz, MD;  Location: [place];  Service: Cardiovascular;  Laterality: N/A;   CATARACT EXTRACTION W/ INTRAOCULAR LENS  IMPLANT, BILATERAL Bilateral 2011   [place]   IR FLUORO GUIDE PORT INSERTION RIGHT  10/07/2016   IR RADIOLOGIST EVAL & MGMT  01/05/2018   IR US GUIDE VASC ACCESS RIGHT  10/07/2016   KNEE ARTHROSCOPY Bilateral right 2006;  left 02-15-2007   [place];  1990;  [place]  06/20/2006   bilateral nerve sparing   TEE WITHOUT CARDIOVERSION N/A 06/27/2018   Procedure: TRANSESOPHAGEAL  ECHOCARDIOGRAM (TEE);  Surgeon: Nigel Mormon, MD;  Location: [facility] ENDOSCOPY;  Service: Cardiovascular;  Laterality: N/A;   TOTAL HIP ARTHROPLASTY Left 03/03/2015   Procedure: LEFT TOTAL HIP ARTHROPLASTY ANTERIOR APPROACH;  Surgeon: Dorna Leitz, MD;  Location: [place];  Service: Orthopedics;  Laterality: Left;   TOTAL KNEE ARTHROPLASTY Bilateral left 08-13-2009;  right 12-26-2009   TRANSURETHRAL RESECTION OF BLADDER TUMOR N/A 09/20/2016   Procedure: TRANSURETHRAL RESECTION OF BLADDER TUMOR (TURBT);  Surgeon: Franchot Gallo, MD;  Location: [facility];   Service: Urology;  Laterality: N/A;   TRANSURETHRAL RESECTION OF BLADDER TUMOR WITH MITOMYCIN-C N/A 08/06/2019   Procedure: TRANSURETHRAL RESECTION OF BLADDER TUMOR;  Surgeon: Franchot Gallo, MD;  Location: [facility];  Service: Urology;  Laterality: N/A;    Current Outpatient Medications  Medication Sig Dispense Refill   acetaminophen (TYLENOL) 500 MG tablet Take 1,000 mg by mouth every 6 (six) hours as needed for mild pain or headache.     amoxicillin (AMOXIL) 500 MG capsule TAKE 4 CAPS (2,000 MG TOTAL) BY MOUTH ONCE FOR 1 DOSE. TAKE 1 HOUR PRIOR TO DENTAL PROCEDURE (Patient taking differently: Take 2,000 mg by mouth See admin instructions. Take 2,000 mg by mouth one hour prior to dental procedures) 4 capsule 4   apixaban (ELIQUIS) 5 MG TABS tablet Take 1 tablet (5 mg total) by mouth 2 (two) times daily. 60 tablet 6   atorvastatin (LIPITOR) 40 MG tablet Take 40 mg by mouth every evening.      Cholecalciferol (VITAMIN D-3 PO) Take 1 capsule by mouth daily with breakfast.     dexamethasone (DECADRON) 4 MG tablet Take 2 tablets (8 mg total) by mouth daily. Start the day after chemotherapy for 2 days. Take with food. 30 tablet 1   diltiazem (CARDIZEM CD) 360 MG 24 hr capsule TAKE 1 CAPSULE BY MOUTH EVERY DAY (Patient taking differently: Take 360 mg by mouth daily. ) 90 capsule 1   dofetilide (TIKOSYN) 250 MCG capsule Take 1 capsule (250 mcg total) by mouth 2 (two) times daily. 60 capsule 6   DULoxetine (CYMBALTA) 60 MG capsule Take 1 capsule (60 mg total) by mouth daily. 30 capsule 3   gabapentin (NEURONTIN) 400 MG capsule TAKE 1 CAPSULE (400 MG TOTAL) BY MOUTH 4 (FOUR) TIMES DAILY. (Patient taking differently: Take 800 mg by mouth daily. ) 360 capsule 2   LORazepam (ATIVAN) 0.5 MG tablet Take 1 tablet (0.5 mg total) by mouth every 6 (six) hours as needed (Nausea or vomiting). (Patient taking differently: Take 0.5 mg by mouth every 6 (six) hours as needed (for nausea or  vomiting). ) 30 tablet 0   magnesium oxide (MAG-OX) 400 MG tablet Take 1 tablet (400 mg total) by mouth 2 (two) times daily.     metoprolol succinate (TOPROL-XL) 50 MG 24 hr tablet Take 1 tablet (50 mg total) by mouth in the morning and at bedtime. 60 tablet 3   oxybutynin (DITROPAN-XL) 10 MG 24 hr tablet Take 10 mg by mouth at bedtime.     pyridoxine (B-6) 100 MG tablet Take 200 mg by mouth daily with breakfast.      traMADol (ULTRAM) 50 MG tablet Take 50 mg by mouth daily as needed (for pain).      No current facility-administered medications for this visit.   Facility-Administered Medications Ordered in Other Visits  Medication Dose Route Frequency Provider Last Rate Last Admin   sodium chloride flush (NS) 0.9 % injection 10 mL  10  mL Intravenous PRN [place], Sarah M, NP   10 mL at 02/21/17 1038   sodium chloride flush (NS) 0.9 % injection 10 mL  10 mL Intravenous PRN Volanda Napoleon, MD   10 mL at 07/10/18 0844   sodium chloride flush (NS) 0.9 % injection 10 mL  10 mL Intravenous PRN Eliezer Bottom, NP   10 mL at 09/18/19 7782    Allergies:   Patient has no known allergies.   Social History:  The patient  reports that he quit smoking about 35 years ago. His smoking use included cigarettes. He has a 16.00 pack-year smoking history. He has never used smokeless tobacco. He reports current alcohol use. He reports current drug use. Drug: Marijuana.   Family History:  The patient's family history includes Aneurysm in his mother; Emphysema in his father; Hypertension in his father and mother.  ROS:  Please see the history of present illness.  All other systems are reviewed and otherwise negative.   PHYSICAL EXAM:  VS:  BP 100/68    Pulse 76    Ht $R'6\' 2"'fI$  (1.88 m)    Wt 273 lb (123.8 kg)    BMI 35.05 kg/m  BMI: Body mass index is 35.05 kg/m. Well nourished, well developed, in no acute distress  HEENT: normocephalic, atraumatic  Neck: no JVD, carotid bruits or  masses Cardiac: RRR; no significant murmurs, no rubs, or gallops Lungs:  CTA b/l, no wheezing, rhonchi or rales  Abd: soft, nontender MS: no deformity or atrophy Ext: trace-1+ b/l edema to just above the ankles  Skin: warm and dry, no rash Neuro:  No gross deficits appreciated Psych: euthymic mood, full affect   EKG and rhythm strip:  Done today and reviewed by myself shows SR 76bpm, manually measured QT 420-414ms, Qtc 473-454ms, PVCs   10/05/2019: TTE IMPRESSIONS  1. Normal LV function; mild LVE; trace MR and TR.  2. Left ventricular ejection fraction, by estimation, is 55 to 60%. The  left ventricle has normal function. The left ventricle has no regional  wall motion abnormalities. The left ventricular internal cavity size was  mildly dilated. Left ventricular  diastolic parameters were normal.  3. Right ventricular systolic function is normal. The right ventricular  size is normal. There is mildly elevated pulmonary artery systolic  pressure.  4. The mitral valve is normal in structure. Trivial mitral valve  regurgitation. No evidence of mitral stenosis.  5. The aortic valve has an indeterminant number of cusps. Aortic valve  regurgitation is not visualized. No aortic stenosis is present.  6. The inferior vena cava is normal in size with greater than 50%  respiratory variability, suggesting right atrial pressure of 3 mmHg.    06/28/2019: stress myoview  There was no ST segment deviation noted during stress.  Findings consistent with prior myocardial infarction with peri-infarct ischemia.  This is an intermediate risk study.   There is a medium size perfusion defect. There is a mild fixed perfusion defect in the mid anterior wall. There is a moderate perfusoin defect in the anteroapex, and mild defect in the apical cap, both with partial reversibility. These findings suggest prior infarct, with peri-infarct ischemia.   Volumetric assessment, EF and wall motion could  not be assessed in the absence of ECG gating with atrial fibrillation.     06/27/2018: TEE IMPRESSIONS  1. The left ventricle has normal systolic function, with an ejection  fraction of 55-60%.  2. Moderate mitral regurgitation.  3. No valvular vegetations  seen.  4. 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    06/22/2018: TTE IMPRESSIONS  1. The left ventricle has normal systolic function, with an ejection  fraction of 55-60%. The cavity size was normal. Left ventricular diastolic  Doppler parameters are consistent with impaired relaxation.  2. The right ventricle has normal systolic function. The cavity was  mildly enlarged. There is no increase in right ventricular wall thickness.  3. Right atrial size was mildly dilated.  4. Moderate thickening of the aortic valve Mild calcification of the  aortic valve.  5. There is dilatation of the aortic root measuring 39 mm.  6. No vegetations detected.    Recent Labs: 10/02/2019: ALT 17; Hemoglobin 10.8; Platelet Count 135 10/05/2019: BUN 42; Creatinine, Ser 1.99; Magnesium 2.1; Potassium 4.2; Sodium 136  No results found for requested labs within last 8760 hours.   CrCl cannot be calculated (Patient's most recent lab result is older than the maximum 21 days allowed.).   Wt Readings from Last 3 Encounters:  10/29/19 273 lb (123.8 kg)  10/05/19 276 lb 12.8 oz (125.6 kg)  10/02/19 274 lb (124.3 kg)     Other studies reviewed: Additional studies/records reviewed today include: summarized above  ASSESSMENT AND PLAN:  1. Persistent AFib     CHA2DS2Vasc is 2, on eliquis, appropriately dosed     Tikosyn      QTc is OK      No symptoms of his AF     Labs today  He has CKD, Calc CrCl with his labs from 10/05/19 is 62  (using 2.48 creat which is more his baseline his Calc CrCl is 50) his 27mcg dose is appropriate Given his age/weight his Eliquis dose is appropriate as well.  Medicine list is reviewed today  with the patient/his wife and pharmacist.  The patient and his wife were instructed not to use Compazine given Tikosyn. Should he require anti nausea medicine in the future in d/w RPH promethazine would be OK   2. HTN     Looks good, no changes  3. PVCs     Preserved LVEF, pre-date his Tikosyn       Disposition: given his CKD, chemo, would like to keep a closer eye on him, he will have follow up in the AFib clinic in 7mo, sooner if needed.  Current medicines are reviewed at length with the patient today.  The patient did not have any concerns regarding medicines.  Venetia Night, PA-C 10/29/2019 3:56 PM     [address] [place] [phone] (office)  [phone] (fax)

## 2019-10-29 ENCOUNTER — Ambulatory Visit (INDEPENDENT_AMBULATORY_CARE_PROVIDER_SITE_OTHER): Payer: PPO | Admitting: Physician Assistant

## 2019-10-29 ENCOUNTER — Other Ambulatory Visit: Payer: Self-pay

## 2019-10-29 VITALS — BP 100/68 | HR 76 | Ht 74.0 in | Wt 273.0 lb

## 2019-10-29 DIAGNOSIS — I10 Essential (primary) hypertension: Secondary | ICD-10-CM | POA: Diagnosis not present

## 2019-10-29 DIAGNOSIS — M6281 Muscle weakness (generalized): Secondary | ICD-10-CM | POA: Diagnosis not present

## 2019-10-29 DIAGNOSIS — Z5181 Encounter for therapeutic drug level monitoring: Secondary | ICD-10-CM | POA: Diagnosis not present

## 2019-10-29 DIAGNOSIS — I493 Ventricular premature depolarization: Secondary | ICD-10-CM

## 2019-10-29 DIAGNOSIS — Z79899 Other long term (current) drug therapy: Secondary | ICD-10-CM

## 2019-10-29 DIAGNOSIS — I4819 Other persistent atrial fibrillation: Secondary | ICD-10-CM

## 2019-10-29 LAB — BASIC METABOLIC PANEL
BUN/Creatinine Ratio: 10 (ref 10–24)
BUN: 21 mg/dL (ref 8–27)
CO2: 26 mmol/L (ref 20–29)
Calcium: 9 mg/dL (ref 8.6–10.2)
Chloride: 99 mmol/L (ref 96–106)
Creatinine, Ser: 2.14 mg/dL — ABNORMAL HIGH (ref 0.76–1.27)
GFR calc Af Amer: 35 mL/min/{1.73_m2} — ABNORMAL LOW (ref >59)
GFR calc non Af Amer: 31 mL/min/{1.73_m2} — ABNORMAL LOW (ref >59)
Glucose: 173 mg/dL — ABNORMAL HIGH (ref 65–99)
Potassium: 3.6 mmol/L (ref 3.5–5.2)
Sodium: 138 mmol/L (ref 134–144)

## 2019-10-29 LAB — MAGNESIUM: Magnesium: 2 mg/dL (ref 1.6–2.3)

## 2019-10-29 NOTE — Patient Instructions (Addendum)
Medication Instructions:   STOP TAKING COMPAZINE   (IF MEDICATION IS NEEDED FOR NAUSEA CAN TAKE PROMETHAZINE)  *If you need a refill on your cardiac medications before your next appointment, please call your pharmacy*   Lab Work:  [facility] AND [place]   If you have labs (blood work) drawn today and your tests are completely normal, you will receive your results only by: Marland Kitchen MyChart Message (if you have MyChart) OR . A paper copy in the mail If you have any lab test that is abnormal or we need to change your treatment, we will call you to review the results.   Testing/Procedures:.NONE ORDERED  TODAY    Follow-Up: At [facility], you and your health needs are our priority.  As part of our continuing mission to provide you with exceptional heart care, we have created designated Provider Care Teams.  These Care Teams include your primary Cardiologist (physician) and Advanced Practice Providers (APPs -  Physician Assistants and Nurse Practitioners) who all work together to provide you with the care you need, when you need it.  We recommend signing up for the patient portal called "MyChart".  Sign up information is provided on this After Visit Summary.  MyChart is used to connect with patients for Virtual Visits (Telemedicine).  Patients are able to view lab/test results, encounter notes, upcoming appointments, etc.  Non-urgent messages can be sent to your provider as well.   To learn more about what you can do with MyChart, go to [email or web address].    Your next appointment:  AFIB 3 MONTHS   Provider:  Laroy Apple    Other Instructions

## 2019-10-30 ENCOUNTER — Inpatient Hospital Stay (HOSPITAL_BASED_OUTPATIENT_CLINIC_OR_DEPARTMENT_OTHER): Payer: PPO | Admitting: Family

## 2019-10-30 ENCOUNTER — Encounter: Payer: Self-pay | Admitting: Family

## 2019-10-30 ENCOUNTER — Inpatient Hospital Stay: Payer: PPO

## 2019-10-30 ENCOUNTER — Other Ambulatory Visit: Payer: Self-pay | Admitting: *Deleted

## 2019-10-30 ENCOUNTER — Inpatient Hospital Stay: Payer: PPO | Attending: Hematology & Oncology

## 2019-10-30 VITALS — BP 135/85 | HR 80 | Temp 98.0°F | Resp 19 | Ht 74.0 in | Wt 272.4 lb

## 2019-10-30 DIAGNOSIS — D500 Iron deficiency anemia secondary to blood loss (chronic): Secondary | ICD-10-CM | POA: Diagnosis not present

## 2019-10-30 DIAGNOSIS — Z79899 Other long term (current) drug therapy: Secondary | ICD-10-CM

## 2019-10-30 DIAGNOSIS — Z5189 Encounter for other specified aftercare: Secondary | ICD-10-CM | POA: Diagnosis not present

## 2019-10-30 DIAGNOSIS — R0609 Other forms of dyspnea: Secondary | ICD-10-CM | POA: Diagnosis not present

## 2019-10-30 DIAGNOSIS — C679 Malignant neoplasm of bladder, unspecified: Secondary | ICD-10-CM | POA: Insufficient documentation

## 2019-10-30 DIAGNOSIS — C772 Secondary and unspecified malignant neoplasm of intra-abdominal lymph nodes: Secondary | ICD-10-CM | POA: Insufficient documentation

## 2019-10-30 DIAGNOSIS — Z5111 Encounter for antineoplastic chemotherapy: Secondary | ICD-10-CM | POA: Insufficient documentation

## 2019-10-30 LAB — CBC WITH DIFFERENTIAL (CANCER CENTER ONLY)
Abs Immature Granulocytes: 0.02 10*3/uL (ref 0.00–0.07)
Basophils Absolute: 0 10*3/uL (ref 0.0–0.1)
Basophils Relative: 1 %
Eosinophils Absolute: 0.1 10*3/uL (ref 0.0–0.5)
Eosinophils Relative: 1 %
HCT: 35.5 % — ABNORMAL LOW (ref 39.0–52.0)
Hemoglobin: 11.8 g/dL — ABNORMAL LOW (ref 13.0–17.0)
Immature Granulocytes: 0 %
Lymphocytes Relative: 14 %
Lymphs Abs: 1.2 10*3/uL (ref 0.7–4.0)
MCH: 32.1 pg (ref 26.0–34.0)
MCHC: 33.2 g/dL (ref 30.0–36.0)
MCV: 96.5 fL (ref 80.0–100.0)
Monocytes Absolute: 1.1 10*3/uL — ABNORMAL HIGH (ref 0.1–1.0)
Monocytes Relative: 13 %
Neutro Abs: 5.7 10*3/uL (ref 1.7–7.7)
Neutrophils Relative %: 71 %
Platelet Count: 151 10*3/uL (ref 150–400)
RBC: 3.68 MIL/uL — ABNORMAL LOW (ref 4.22–5.81)
RDW: 16.5 % — ABNORMAL HIGH (ref 11.5–15.5)
WBC Count: 8.1 10*3/uL (ref 4.0–10.5)
nRBC: 0 % (ref 0.0–0.2)

## 2019-10-30 LAB — SAMPLE TO BLOOD BANK

## 2019-10-30 LAB — CMP (CANCER CENTER ONLY)
ALT: 19 U/L (ref 0–44)
AST: 25 U/L (ref 15–41)
Albumin: 4.1 g/dL (ref 3.5–5.0)
Alkaline Phosphatase: 89 U/L (ref 38–126)
Anion gap: 9 (ref 5–15)
BUN: 26 mg/dL — ABNORMAL HIGH (ref 8–23)
CO2: 29 mmol/L (ref 22–32)
Calcium: 9.4 mg/dL (ref 8.9–10.3)
Chloride: 99 mmol/L (ref 98–111)
Creatinine: 2.55 mg/dL — ABNORMAL HIGH (ref 0.61–1.24)
GFR, Est AFR Am: 29 mL/min — ABNORMAL LOW (ref >60)
GFR, Estimated: 25 mL/min — ABNORMAL LOW (ref >60)
Glucose, Bld: 209 mg/dL — ABNORMAL HIGH (ref 70–99)
Potassium: 3.6 mmol/L (ref 3.5–5.1)
Sodium: 137 mmol/L (ref 135–145)
Total Bilirubin: 0.6 mg/dL (ref 0.3–1.2)
Total Protein: 6.4 g/dL — ABNORMAL LOW (ref 6.5–8.1)

## 2019-10-30 MED ORDER — SODIUM CHLORIDE 0.9 % IV SOLN
500.0000 mg/m2 | Freq: Once | INTRAVENOUS | Status: AC
  Administered 2019-10-30: 1254 mg via INTRAVENOUS
  Filled 2019-10-30: qty 26.3

## 2019-10-30 MED ORDER — SODIUM CHLORIDE 0.9 % IV SOLN
10.0000 mg | Freq: Once | INTRAVENOUS | Status: AC
  Administered 2019-10-30: 10 mg via INTRAVENOUS
  Filled 2019-10-30: qty 1

## 2019-10-30 MED ORDER — PALONOSETRON HCL INJECTION 0.25 MG/5ML
0.2500 mg | Freq: Once | INTRAVENOUS | Status: AC
  Administered 2019-10-30: 0.25 mg via INTRAVENOUS

## 2019-10-30 MED ORDER — POTASSIUM CHLORIDE ER 10 MEQ PO TBCR
10.0000 meq | EXTENDED_RELEASE_TABLET | Freq: Every day | ORAL | 3 refills | Status: DC
Start: 2019-10-30 — End: 2019-11-12

## 2019-10-30 MED ORDER — SODIUM CHLORIDE 0.9 % IV SOLN
INTRAVENOUS | Status: DC
  Filled 2019-10-30 (×2): qty 250

## 2019-10-30 MED ORDER — SODIUM CHLORIDE 0.9% FLUSH
10.0000 mL | INTRAVENOUS | Status: DC | PRN
  Administered 2019-10-30: 10 mL
  Filled 2019-10-30: qty 10

## 2019-10-30 MED ORDER — SODIUM CHLORIDE 0.9 % IV SOLN
400.0000 mg/m2 | Freq: Once | INTRAVENOUS | Status: AC
  Administered 2019-10-30: 1000 mg via INTRAVENOUS
  Filled 2019-10-30: qty 10

## 2019-10-30 MED ORDER — SODIUM CHLORIDE 0.9 % IV SOLN
Freq: Once | INTRAVENOUS | Status: AC
  Filled 2019-10-30: qty 250

## 2019-10-30 MED ORDER — PALONOSETRON HCL INJECTION 0.25 MG/5ML
INTRAVENOUS | Status: AC
  Filled 2019-10-30: qty 5

## 2019-10-30 MED ORDER — SODIUM CHLORIDE 0.9 % IV SOLN
Freq: Once | INTRAVENOUS | Status: AC
  Filled 2019-10-30: qty 60

## 2019-10-30 MED ORDER — HEPARIN SOD (PORK) LOCK FLUSH 100 UNIT/ML IV SOLN
500.0000 [IU] | Freq: Once | INTRAVENOUS | Status: AC | PRN
  Administered 2019-10-30: 500 [IU]
  Filled 2019-10-30: qty 5

## 2019-10-30 NOTE — Progress Notes (Signed)
Hematology and Oncology Follow Up Visit  [name] Rose [id number] [birth year] 69 y.o. 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 Taxotere 80mg /m2 IV q 3 wks - s/p cycle #3 - d/c due to progression M-VAC - s/p cycle#8 -- d/c on 07/10/2018 due to blood counts Padcev -- s/p cycle #1 on 04/06/2020d/c 08/28/2018  Current Therapy: Ifosfamide/Gemzar -- s/p cycle 7- started 04/16/2019 IV iron as indicated   Interim History:  [name] Rose is here today for follow-up and treatment. He saw cardiology the last week of June for follow-up after having bigeminy PVC's and having to hold chemo. He states that this may happen periodically and that so far he has had no new episodes.  He has mild SOB with over exertion he states due to deconditioning.  No fever, chills, n/v, cough, rash, dizziness, chest pain, palpitations, abdominal pain or changes in bowel or bladder habits.  The swelling in his lower extremities has almost completely resolved.  He states that his PCP has referred him to neurology for the neuropathy in his feet to see if this has been worsened due to his chronic back issues. He is waiting to schedule.  No falls or syncopal episodes to report.  He is eating well but admits that he needs to better hydrate throughout the day. His weight is stable.   ECOG Performance Status: 1 - Symptomatic but completely ambulatory  Medications:  Allergies as of 10/30/2019   No Known Allergies     Medication List       Accurate as of October 30, 2019 10:39 AM. If you have any questions, ask your nurse or doctor.        acetaminophen 500 MG tablet Commonly known as: TYLENOL Take 1,000 mg by mouth every 6 (six) hours as needed for mild pain or headache.   amoxicillin 500 MG capsule Commonly known as: AMOXIL TAKE 4 CAPS  (2,000 MG TOTAL) BY MOUTH ONCE FOR 1 DOSE. TAKE 1 HOUR PRIOR TO DENTAL PROCEDURE   apixaban 5 MG Tabs tablet Commonly known as: Eliquis Take 1 tablet (5 mg total) by mouth 2 (two) times daily.   atorvastatin 40 MG tablet Commonly known as: LIPITOR Take 40 mg by mouth every evening.   dexamethasone 4 MG tablet Commonly known as: DECADRON Take 2 tablets (8 mg total) by mouth daily. Start the day after chemotherapy for 2 days. Take with food.   diltiazem 360 MG 24 hr capsule Commonly known as: CARDIZEM CD TAKE 1 CAPSULE BY MOUTH EVERY DAY What changed: how much to take   dofetilide 250 MCG capsule Commonly known as: TIKOSYN Take 1 capsule (250 mcg total) by mouth 2 (two) times daily.   DULoxetine 60 MG capsule Commonly known as: CYMBALTA Take 1 capsule (60 mg total) by mouth daily.   gabapentin 400 MG capsule Commonly known as: NEURONTIN TAKE 1 CAPSULE (400 MG TOTAL) BY MOUTH 4 (FOUR) TIMES DAILY. What changed:   how much to take  when to take this   LORazepam 0.5 MG tablet Commonly known as: Ativan Take 1 tablet (0.5 mg total) by mouth every 6 (six) hours as needed (Nausea or vomiting). What changed: reasons to take this   magnesium oxide 400 MG tablet Commonly known as: MAG-OX Take 1 tablet (400 mg total) by mouth 2 (two) times daily.   metoprolol succinate  50 MG 24 hr tablet Commonly known as: TOPROL-XL Take 1 tablet (50 mg total) by mouth in the morning and at bedtime.   oxybutynin 10 MG 24 hr tablet Commonly known as: DITROPAN-XL Take 10 mg by mouth at bedtime.   potassium chloride 10 MEQ tablet Commonly known as: KLOR-CON Take 1 tablet (10 mEq total) by mouth daily. Started by: Claude Manges, CMA   pyridoxine 100 MG tablet Commonly known as: B-6 Take 200 mg by mouth daily with breakfast.   traMADol 50 MG tablet Commonly known as: ULTRAM Take 50 mg by mouth daily as needed (for pain).   VITAMIN D-3 PO Take 1 capsule by mouth daily with breakfast.        Allergies: No Known Allergies  Past Medical History, Surgical history, Social history, and Family History were reviewed and updated.  Review of Systems: All other 10 point review of systems is negative.   Physical Exam:  height is $RemoveB'6\' 2"'mvqXLXDF$  (1.88 m) and weight is 272 lb 6.4 oz (123.6 kg). His oral temperature is 98 F (36.7 C). His blood pressure is 135/85 and his pulse is 80. His respiration is 19 and oxygen saturation is 98%.   Wt Readings from Last 3 Encounters:  10/30/19 272 lb 6.4 oz (123.6 kg)  10/29/19 273 lb (123.8 kg)  10/05/19 276 lb 12.8 oz (125.6 kg)    Ocular: Sclerae unicteric, pupils equal, round and reactive to light Ear-nose-throat: Oropharynx clear, dentition fair Lymphatic: No cervical or supraclavicular adenopathy Lungs no rales or rhonchi, good excursion bilaterally Heart regular rate and rhythm, no murmur appreciated Abd soft, nontender, positive bowel sounds, No liver or spleen tip palpated on exam, no fluid wave  MSK no focal spinal tenderness, no joint edema Neuro: non-focal, well-oriented, appropriate affect Breasts: Deferred   Lab Results  Component Value Date   WBC 8.1 10/30/2019   HGB 11.8 (L) 10/30/2019   HCT 35.5 (L) 10/30/2019   MCV 96.5 10/30/2019   PLT 151 10/30/2019   Lab Results  Component Value Date   FERRITIN 2,367 (H) 10/02/2019   IRON 72 10/02/2019   TIBC 261 10/02/2019   UIBC 189 10/02/2019   IRONPCTSAT 28 10/02/2019   Lab Results  Component Value Date   RETICCTPCT 2.4 10/02/2019   RBC 3.68 (L) 10/30/2019   No results found for: KPAFRELGTCHN, LAMBDASER, KAPLAMBRATIO No results found for: IGGSERUM, IGA, IGMSERUM No results found for: Kathrynn Ducking, MSPIKE, SPEI   Chemistry      Component Value Date/Time   NA 138 10/29/2019 1049   NA 141 02/21/2017 0902   K 3.6 10/29/2019 1049   K 4.1 02/21/2017 0902   CL 99 10/29/2019 1049   CL 98 02/21/2017 0902   CO2 26 10/29/2019  1049   CO2 31 02/21/2017 0902   BUN 21 10/29/2019 1049   BUN 17 02/21/2017 0902   CREATININE 2.14 (H) 10/29/2019 1049   CREATININE 1.96 (H) 10/04/2019 0940   CREATININE 1.2 02/21/2017 0902      Component Value Date/Time   CALCIUM 9.0 10/29/2019 1049   CALCIUM 9.2 02/21/2017 0902   ALKPHOS 70 10/02/2019 0820   ALKPHOS 65 02/21/2017 0902   AST 23 10/02/2019 0820   ALT 17 10/02/2019 0820   ALT 18 02/21/2017 0902   BILITOT 0.4 10/02/2019 0820       Impression and Plan: Mr. Searls is a very pleasant 69yo caucasian gentleman with metastatic high grade bladder cancer. He seems to be  doing well at this time. No heart symptoms. We will proceed with treatment today as planned per [name].  He will now go to monthly treatments. We will plan to see him again in August.  He can contact our office with any questions or concerns. We can certainly see him sooner if needed.   Laverna Peace, NP 7/20/202110:39 AM

## 2019-10-30 NOTE — Patient Instructions (Signed)
[place] Discharge Instructions for Patients Receiving Chemotherapy  Today you received the following chemotherapy agents Ifosphamide, Mesna, Gemzar  To help prevent nausea and vomiting after your treatment, we encourage you to take your nausea medication    If you develop nausea and vomiting that is not controlled by your nausea medication, call the clinic.   BELOW ARE SYMPTOMS THAT SHOULD BE REPORTED IMMEDIATELY:  *FEVER GREATER THAN 100.5 F  *CHILLS WITH OR WITHOUT FEVER  NAUSEA AND VOMITING THAT IS NOT CONTROLLED WITH YOUR NAUSEA MEDICATION  *UNUSUAL SHORTNESS OF BREATH  *UNUSUAL BRUISING OR BLEEDING  TENDERNESS IN MOUTH AND THROAT WITH OR WITHOUT PRESENCE OF ULCERS  *URINARY PROBLEMS  *BOWEL PROBLEMS  UNUSUAL RASH Items with * indicate a potential emergency and should be followed up as soon as possible.  Feel free to call the clinic should you have any questions or concerns. The clinic phone number is [phone].  Please show the [name] Rose at check-in to the Emergency Department and triage nurse.

## 2019-10-30 NOTE — Patient Instructions (Signed)
Implanted Port Insertion, Care After °This sheet gives you information about how to care for yourself after your procedure. Your health care provider may also give you more specific instructions. If you have problems or questions, contact your health care provider. °What can I expect after the procedure? °After the procedure, it is common to have: °· Discomfort at the port insertion site. °· Bruising on the skin over the port. This should improve over 3-4 days. °Follow these instructions at home: °Port care °· After your port is placed, you will get a manufacturer's information card. The card has information about your port. Keep this card with you at all times. °· Take care of the port as told by your health care provider. Ask your health care provider if you or a family member can get training for taking care of the port at home. A home health care nurse may also take care of the port. °· Make sure to remember what type of port you have. °Incision care ° °  ° °· Follow instructions from your health care provider about how to take care of your port insertion site. Make sure you: °? Wash your hands with soap and water before and after you change your bandage (dressing). If soap and water are not available, use hand sanitizer. °? Change your dressing as told by your health care provider. °? Leave stitches (sutures), skin glue, or adhesive strips in place. These skin closures may need to stay in place for 2 weeks or longer. If adhesive strip edges start to loosen and curl up, you may trim the loose edges. Do not remove adhesive strips completely unless your health care provider tells you to do that. °· Check your port insertion site every day for signs of infection. Check for: °? Redness, swelling, or pain. °? Fluid or blood. °? Warmth. °? Pus or a bad smell. °Activity °· Return to your normal activities as told by your health care provider. Ask your health care provider what activities are safe for you. °· Do not  lift anything that is heavier than 10 lb (4.5 kg), or the limit that you are told, until your health care provider says that it is safe. °General instructions °· Take over-the-counter and prescription medicines only as told by your health care provider. °· Do not take baths, swim, or use a hot tub until your health care provider approves. Ask your health care provider if you may take showers. You may only be allowed to take sponge baths. °· Do not drive for 24 hours if you were given a sedative during your procedure. °· Wear a medical alert bracelet in case of an emergency. This will tell any health care providers that you have a port. °· Keep all follow-up visits as told by your health care provider. This is important. °Contact a health care provider if: °· You cannot flush your port with saline as directed, or you cannot draw blood from the port. °· You have a fever or chills. °· You have redness, swelling, or pain around your port insertion site. °· You have fluid or blood coming from your port insertion site. °· Your port insertion site feels warm to the touch. °· You have pus or a bad smell coming from the port insertion site. °Get help right away if: °· You have chest pain or shortness of breath. °· You have bleeding from your port that you cannot control. °Summary °· Take care of the port as told by your health   care provider. Keep the manufacturer's information card with you at all times. °· Change your dressing as told by your health care provider. °· Contact a health care provider if you have a fever or chills or if you have redness, swelling, or pain around your port insertion site. °· Keep all follow-up visits as told by your health care provider. °This information is not intended to replace advice given to you by your health care provider. Make sure you discuss any questions you have with your health care provider. °Document Revised: 10/25/2017 Document Reviewed: 10/25/2017 °Elsevier Patient Education ©  2020 [employer] ° °

## 2019-10-30 NOTE — Progress Notes (Signed)
Ok to treat with creatinine of 2.55 per Dr Marin Olp. dph

## 2019-10-31 ENCOUNTER — Inpatient Hospital Stay: Payer: PPO

## 2019-10-31 ENCOUNTER — Other Ambulatory Visit: Payer: Self-pay

## 2019-10-31 VITALS — BP 110/52 | HR 72 | Temp 99.0°F | Resp 16

## 2019-10-31 DIAGNOSIS — Z5111 Encounter for antineoplastic chemotherapy: Secondary | ICD-10-CM | POA: Diagnosis not present

## 2019-10-31 DIAGNOSIS — C772 Secondary and unspecified malignant neoplasm of intra-abdominal lymph nodes: Secondary | ICD-10-CM

## 2019-10-31 DIAGNOSIS — C679 Malignant neoplasm of bladder, unspecified: Secondary | ICD-10-CM

## 2019-10-31 LAB — IRON AND TIBC
Iron: 110 ug/dL (ref 42–163)
Saturation Ratios: 48 % (ref 20–55)
TIBC: 228 ug/dL (ref 202–409)
UIBC: 117 ug/dL (ref 117–376)

## 2019-10-31 LAB — FERRITIN: Ferritin: 2489 ng/mL — ABNORMAL HIGH (ref 24–336)

## 2019-10-31 MED ORDER — SODIUM CHLORIDE 0.9 % IV SOLN
400.0000 mg/m2 | Freq: Once | INTRAVENOUS | Status: AC
  Administered 2019-10-31: 1000 mg via INTRAVENOUS
  Filled 2019-10-31: qty 10

## 2019-10-31 MED ORDER — SODIUM CHLORIDE 0.9 % IV SOLN
Freq: Once | INTRAVENOUS | Status: AC
  Filled 2019-10-31: qty 250

## 2019-10-31 MED ORDER — SODIUM CHLORIDE 0.9 % IV SOLN
10.0000 mg | Freq: Once | INTRAVENOUS | Status: AC
  Administered 2019-10-31: 10 mg via INTRAVENOUS
  Filled 2019-10-31: qty 1

## 2019-10-31 MED ORDER — SODIUM CHLORIDE 0.9% FLUSH
10.0000 mL | INTRAVENOUS | Status: DC | PRN
  Administered 2019-10-31: 10 mL
  Filled 2019-10-31: qty 10

## 2019-10-31 MED ORDER — SODIUM CHLORIDE 0.9 % IV SOLN
Freq: Once | INTRAVENOUS | Status: AC
  Filled 2019-10-31: qty 60

## 2019-10-31 MED ORDER — SODIUM CHLORIDE 0.9 % IV SOLN
150.0000 mg | Freq: Once | INTRAVENOUS | Status: AC
  Administered 2019-10-31: 150 mg via INTRAVENOUS
  Filled 2019-10-31: qty 5

## 2019-10-31 MED ORDER — HEPARIN SOD (PORK) LOCK FLUSH 100 UNIT/ML IV SOLN
500.0000 [IU] | Freq: Once | INTRAVENOUS | Status: AC | PRN
  Administered 2019-10-31: 500 [IU]
  Filled 2019-10-31: qty 5

## 2019-10-31 NOTE — Patient Instructions (Signed)
[place] Discharge Instructions for Patients Receiving Chemotherapy  Today you received the following chemotherapy agents Ifosphamide, Mesna,  To help prevent nausea and vomiting after your treatment, we encourage you to take your nausea medication    If you develop nausea and vomiting that is not controlled by your nausea medication, call the clinic.   BELOW ARE SYMPTOMS THAT SHOULD BE REPORTED IMMEDIATELY:  *FEVER GREATER THAN 100.5 F  *CHILLS WITH OR WITHOUT FEVER  NAUSEA AND VOMITING THAT IS NOT CONTROLLED WITH YOUR NAUSEA MEDICATION  *UNUSUAL SHORTNESS OF BREATH  *UNUSUAL BRUISING OR BLEEDING  TENDERNESS IN MOUTH AND THROAT WITH OR WITHOUT PRESENCE OF ULCERS  *URINARY PROBLEMS  *BOWEL PROBLEMS  UNUSUAL RASH Items with * indicate a potential emergency and should be followed up as soon as possible.  Feel free to call the clinic should you have any questions or concerns. The clinic phone number is [phone].  Please show the [place] at check-in to the Emergency Department and triage nurse.

## 2019-11-01 ENCOUNTER — Inpatient Hospital Stay: Payer: PPO

## 2019-11-01 VITALS — BP 111/49 | HR 77 | Temp 98.3°F | Resp 17

## 2019-11-01 DIAGNOSIS — C772 Secondary and unspecified malignant neoplasm of intra-abdominal lymph nodes: Secondary | ICD-10-CM

## 2019-11-01 DIAGNOSIS — Z5111 Encounter for antineoplastic chemotherapy: Secondary | ICD-10-CM | POA: Diagnosis not present

## 2019-11-01 MED ORDER — PEGFILGRASTIM 6 MG/0.6ML ~~LOC~~ PSKT
6.0000 mg | PREFILLED_SYRINGE | Freq: Once | SUBCUTANEOUS | Status: AC
  Administered 2019-11-01: 6 mg via SUBCUTANEOUS

## 2019-11-01 MED ORDER — PEGFILGRASTIM 6 MG/0.6ML ~~LOC~~ PSKT
PREFILLED_SYRINGE | SUBCUTANEOUS | Status: AC
  Filled 2019-11-01: qty 0.6

## 2019-11-01 MED ORDER — HEPARIN SOD (PORK) LOCK FLUSH 100 UNIT/ML IV SOLN
500.0000 [IU] | Freq: Once | INTRAVENOUS | Status: AC | PRN
  Administered 2019-11-01: 500 [IU]
  Filled 2019-11-01: qty 5

## 2019-11-01 MED ORDER — SODIUM CHLORIDE 0.9 % IV SOLN
10.0000 mg | Freq: Once | INTRAVENOUS | Status: AC
  Administered 2019-11-01: 10 mg via INTRAVENOUS
  Filled 2019-11-01: qty 1

## 2019-11-01 MED ORDER — SODIUM CHLORIDE 0.9% FLUSH
10.0000 mL | INTRAVENOUS | Status: DC | PRN
  Administered 2019-11-01: 10 mL
  Filled 2019-11-01: qty 10

## 2019-11-01 MED ORDER — PALONOSETRON HCL INJECTION 0.25 MG/5ML
INTRAVENOUS | Status: AC
  Filled 2019-11-01: qty 5

## 2019-11-01 MED ORDER — SODIUM CHLORIDE 0.9 % IV SOLN
Freq: Once | INTRAVENOUS | Status: AC
  Filled 2019-11-01: qty 250

## 2019-11-01 MED ORDER — SODIUM CHLORIDE 0.9 % IV SOLN
400.0000 mg/m2 | Freq: Once | INTRAVENOUS | Status: AC
  Administered 2019-11-01: 1000 mg via INTRAVENOUS
  Filled 2019-11-01: qty 10

## 2019-11-01 MED ORDER — PALONOSETRON HCL INJECTION 0.25 MG/5ML
0.2500 mg | Freq: Once | INTRAVENOUS | Status: AC
  Administered 2019-11-01: 0.25 mg via INTRAVENOUS

## 2019-11-01 MED ORDER — SODIUM CHLORIDE 0.9 % IV SOLN
Freq: Once | INTRAVENOUS | Status: AC
  Filled 2019-11-01: qty 60

## 2019-11-01 NOTE — Patient Instructions (Addendum)
[name] Discharge Instructions for Patients Receiving Chemotherapy  Today you received the following chemotherapy agents Ifosphamide, Mesna  To help prevent nausea and vomiting after your treatment, we encourage you to take your nausea medication    If you develop nausea and vomiting that is not controlled by your nausea medication, call the clinic.   BELOW ARE SYMPTOMS THAT SHOULD BE REPORTED IMMEDIATELY:  *FEVER GREATER THAN 100.5 F  *CHILLS WITH OR WITHOUT FEVER  NAUSEA AND VOMITING THAT IS NOT CONTROLLED WITH YOUR NAUSEA MEDICATION  *UNUSUAL SHORTNESS OF BREATH  *UNUSUAL BRUISING OR BLEEDING  TENDERNESS IN MOUTH AND THROAT WITH OR WITHOUT PRESENCE OF ULCERS  *URINARY PROBLEMS  *BOWEL PROBLEMS  UNUSUAL RASH Items with * indicate a potential emergency and should be followed up as soon as possible.  Feel free to call the clinic should you have any questions or concerns. The clinic phone number is [phone].  Please show the [place] at check-in to the Emergency Department and triage nurse.

## 2019-11-02 ENCOUNTER — Other Ambulatory Visit: Payer: Self-pay

## 2019-11-02 ENCOUNTER — Other Ambulatory Visit: Payer: PPO | Admitting: *Deleted

## 2019-11-02 ENCOUNTER — Telehealth: Payer: Self-pay | Admitting: Physician Assistant

## 2019-11-02 DIAGNOSIS — Z79899 Other long term (current) drug therapy: Secondary | ICD-10-CM

## 2019-11-02 LAB — BASIC METABOLIC PANEL
BUN/Creatinine Ratio: 24 (ref 10–24)
BUN: 42 mg/dL — ABNORMAL HIGH (ref 8–27)
CO2: 25 mmol/L (ref 20–29)
Calcium: 9.1 mg/dL (ref 8.6–10.2)
Chloride: 104 mmol/L (ref 96–106)
Creatinine, Ser: 1.75 mg/dL — ABNORMAL HIGH (ref 0.76–1.27)
GFR calc Af Amer: 45 mL/min/{1.73_m2} — ABNORMAL LOW (ref >59)
GFR calc non Af Amer: 39 mL/min/{1.73_m2} — ABNORMAL LOW (ref >59)
Glucose: 167 mg/dL — ABNORMAL HIGH (ref 65–99)
Potassium: 4.2 mmol/L (ref 3.5–5.2)
Sodium: 139 mmol/L (ref 134–144)

## 2019-11-02 NOTE — Telephone Encounter (Signed)
Opened in error

## 2019-11-08 DIAGNOSIS — Z8546 Personal history of malignant neoplasm of prostate: Secondary | ICD-10-CM | POA: Diagnosis not present

## 2019-11-08 DIAGNOSIS — I10 Essential (primary) hypertension: Secondary | ICD-10-CM | POA: Diagnosis not present

## 2019-11-08 DIAGNOSIS — E7800 Pure hypercholesterolemia, unspecified: Secondary | ICD-10-CM | POA: Diagnosis not present

## 2019-11-08 DIAGNOSIS — I480 Paroxysmal atrial fibrillation: Secondary | ICD-10-CM | POA: Diagnosis not present

## 2019-11-08 DIAGNOSIS — N183 Chronic kidney disease, stage 3 unspecified: Secondary | ICD-10-CM | POA: Diagnosis not present

## 2019-11-09 ENCOUNTER — Telehealth: Payer: Self-pay | Admitting: Physician Assistant

## 2019-11-09 DIAGNOSIS — N1832 Chronic kidney disease, stage 3b: Secondary | ICD-10-CM | POA: Diagnosis not present

## 2019-11-09 DIAGNOSIS — E559 Vitamin D deficiency, unspecified: Secondary | ICD-10-CM | POA: Diagnosis not present

## 2019-11-09 NOTE — Telephone Encounter (Signed)
Patients wife, Threasa Beards, wanted to let Tommye Standard PA to call her on her cell at [phone] if she needed to talk with them regarding the labs from this morning.

## 2019-11-09 NOTE — Telephone Encounter (Signed)
Wife reports that pt had blood work completed at [facility] this morning (ordered by [facility]). States that they asked [facility] to also send results to [facility] for Renee to review. Aware that once received Renee will review.

## 2019-11-12 ENCOUNTER — Telehealth: Payer: Self-pay | Admitting: Physician Assistant

## 2019-11-12 MED ORDER — POTASSIUM CHLORIDE ER 10 MEQ PO TBCR
10.0000 meq | EXTENDED_RELEASE_TABLET | Freq: Every day | ORAL | 3 refills | Status: DC
Start: 2019-11-12 — End: 2019-12-19

## 2019-11-12 NOTE — Addendum Note (Signed)
Addended by: France Ravens on: 11/12/2019 03:56 PM   Modules accepted: Orders

## 2019-11-12 NOTE — Telephone Encounter (Signed)
Labs from 11/09/19 reviewed K+ 3.6 Mag 1.9  Creat 1.79  Reviewed labs/case with Dr. Rayann Heman. Wobbling Creat and what recent potassium replacement I had done. He felt his weight Creat Cl would be OK for the 33meq daily of potassium. Called and spoke with Mrs. Joines. Resume Potassium 11meq every day Will have him do repeat BMET Friday or Monday, told her my MA will call them to schedule the lab. She stated understanding  Tommye Standard, PA-C

## 2019-11-13 ENCOUNTER — Telehealth: Payer: Self-pay | Admitting: *Deleted

## 2019-11-13 NOTE — Telephone Encounter (Signed)
Unable to leave a message on  machine voice mail saying pt is unavailable ale try again later. Left vmail on cell number to call back to set up appointment

## 2019-11-14 ENCOUNTER — Other Ambulatory Visit: Payer: Self-pay | Admitting: *Deleted

## 2019-11-14 DIAGNOSIS — Z79899 Other long term (current) drug therapy: Secondary | ICD-10-CM

## 2019-11-14 DIAGNOSIS — I482 Chronic atrial fibrillation, unspecified: Secondary | ICD-10-CM

## 2019-11-14 NOTE — Telephone Encounter (Signed)
Patient's wife, Threasa Beards, is calling to return Shana's call regarding the lab appointment she wants to get scheduled. Please call back.

## 2019-11-15 DIAGNOSIS — E8770 Fluid overload, unspecified: Secondary | ICD-10-CM | POA: Diagnosis not present

## 2019-11-15 DIAGNOSIS — C679 Malignant neoplasm of bladder, unspecified: Secondary | ICD-10-CM | POA: Diagnosis not present

## 2019-11-15 DIAGNOSIS — I129 Hypertensive chronic kidney disease with stage 1 through stage 4 chronic kidney disease, or unspecified chronic kidney disease: Secondary | ICD-10-CM | POA: Diagnosis not present

## 2019-11-15 DIAGNOSIS — N1832 Chronic kidney disease, stage 3b: Secondary | ICD-10-CM | POA: Diagnosis not present

## 2019-11-15 DIAGNOSIS — N179 Acute kidney failure, unspecified: Secondary | ICD-10-CM | POA: Diagnosis not present

## 2019-11-15 DIAGNOSIS — I480 Paroxysmal atrial fibrillation: Secondary | ICD-10-CM | POA: Diagnosis not present

## 2019-11-15 DIAGNOSIS — E559 Vitamin D deficiency, unspecified: Secondary | ICD-10-CM | POA: Diagnosis not present

## 2019-11-16 ENCOUNTER — Other Ambulatory Visit: Payer: PPO | Admitting: *Deleted

## 2019-11-16 ENCOUNTER — Other Ambulatory Visit: Payer: Self-pay

## 2019-11-16 DIAGNOSIS — Z79899 Other long term (current) drug therapy: Secondary | ICD-10-CM

## 2019-11-17 LAB — BASIC METABOLIC PANEL
BUN/Creatinine Ratio: 9 — ABNORMAL LOW (ref 10–24)
BUN: 20 mg/dL (ref 8–27)
CO2: 25 mmol/L (ref 20–29)
Calcium: 8.9 mg/dL (ref 8.6–10.2)
Chloride: 100 mmol/L (ref 96–106)
Creatinine, Ser: 2.15 mg/dL — ABNORMAL HIGH (ref 0.76–1.27)
GFR calc Af Amer: 35 mL/min/{1.73_m2} — ABNORMAL LOW (ref >59)
GFR calc non Af Amer: 31 mL/min/{1.73_m2} — ABNORMAL LOW (ref >59)
Glucose: 143 mg/dL — ABNORMAL HIGH (ref 65–99)
Potassium: 3.6 mmol/L (ref 3.5–5.2)
Sodium: 141 mmol/L (ref 134–144)

## 2019-11-19 ENCOUNTER — Other Ambulatory Visit: Payer: Self-pay | Admitting: *Deleted

## 2019-11-19 DIAGNOSIS — Z79899 Other long term (current) drug therapy: Secondary | ICD-10-CM

## 2019-11-26 ENCOUNTER — Other Ambulatory Visit: Payer: PPO | Admitting: *Deleted

## 2019-11-26 ENCOUNTER — Other Ambulatory Visit: Payer: Self-pay

## 2019-11-26 DIAGNOSIS — Z79899 Other long term (current) drug therapy: Secondary | ICD-10-CM | POA: Diagnosis not present

## 2019-11-27 ENCOUNTER — Inpatient Hospital Stay: Payer: PPO

## 2019-11-27 ENCOUNTER — Inpatient Hospital Stay (HOSPITAL_BASED_OUTPATIENT_CLINIC_OR_DEPARTMENT_OTHER): Payer: PPO | Admitting: Hematology & Oncology

## 2019-11-27 ENCOUNTER — Telehealth: Payer: Self-pay | Admitting: *Deleted

## 2019-11-27 ENCOUNTER — Inpatient Hospital Stay: Payer: PPO | Attending: Hematology & Oncology

## 2019-11-27 ENCOUNTER — Encounter: Payer: Self-pay | Admitting: Hematology & Oncology

## 2019-11-27 VITALS — BP 121/65 | HR 75 | Temp 98.4°F | Resp 18 | Wt 272.0 lb

## 2019-11-27 DIAGNOSIS — R002 Palpitations: Secondary | ICD-10-CM | POA: Insufficient documentation

## 2019-11-27 DIAGNOSIS — C772 Secondary and unspecified malignant neoplasm of intra-abdominal lymph nodes: Secondary | ICD-10-CM

## 2019-11-27 DIAGNOSIS — C679 Malignant neoplasm of bladder, unspecified: Secondary | ICD-10-CM

## 2019-11-27 DIAGNOSIS — Z7901 Long term (current) use of anticoagulants: Secondary | ICD-10-CM | POA: Diagnosis not present

## 2019-11-27 DIAGNOSIS — D500 Iron deficiency anemia secondary to blood loss (chronic): Secondary | ICD-10-CM

## 2019-11-27 DIAGNOSIS — Z5111 Encounter for antineoplastic chemotherapy: Secondary | ICD-10-CM | POA: Diagnosis not present

## 2019-11-27 DIAGNOSIS — Z5189 Encounter for other specified aftercare: Secondary | ICD-10-CM | POA: Diagnosis not present

## 2019-11-27 DIAGNOSIS — G629 Polyneuropathy, unspecified: Secondary | ICD-10-CM | POA: Insufficient documentation

## 2019-11-27 DIAGNOSIS — Z79899 Other long term (current) drug therapy: Secondary | ICD-10-CM | POA: Insufficient documentation

## 2019-11-27 DIAGNOSIS — R110 Nausea: Secondary | ICD-10-CM | POA: Diagnosis not present

## 2019-11-27 DIAGNOSIS — K5780 Diverticulitis of intestine, part unspecified, with perforation and abscess without bleeding: Secondary | ICD-10-CM | POA: Insufficient documentation

## 2019-11-27 LAB — CMP (CANCER CENTER ONLY)
ALT: 19 U/L (ref 0–44)
AST: 22 U/L (ref 15–41)
Albumin: 3.7 g/dL (ref 3.5–5.0)
Alkaline Phosphatase: 92 U/L (ref 38–126)
Anion gap: 7 (ref 5–15)
BUN: 27 mg/dL — ABNORMAL HIGH (ref 8–23)
CO2: 31 mmol/L (ref 22–32)
Calcium: 9.1 mg/dL (ref 8.9–10.3)
Chloride: 100 mmol/L (ref 98–111)
Creatinine: 2.1 mg/dL — ABNORMAL HIGH (ref 0.61–1.24)
GFR, Est AFR Am: 36 mL/min — ABNORMAL LOW (ref >60)
GFR, Estimated: 31 mL/min — ABNORMAL LOW (ref >60)
Glucose, Bld: 207 mg/dL — ABNORMAL HIGH (ref 70–99)
Potassium: 3.5 mmol/L (ref 3.5–5.1)
Sodium: 138 mmol/L (ref 135–145)
Total Bilirubin: 0.5 mg/dL (ref 0.3–1.2)
Total Protein: 5.6 g/dL — ABNORMAL LOW (ref 6.5–8.1)

## 2019-11-27 LAB — RETICULOCYTES
Immature Retic Fract: 24 % — ABNORMAL HIGH (ref 2.3–15.9)
RBC.: 3.01 MIL/uL — ABNORMAL LOW (ref 4.22–5.81)
Retic Count, Absolute: 127.6 10*3/uL (ref 19.0–186.0)
Retic Ct Pct: 4.2 % — ABNORMAL HIGH (ref 0.4–3.1)

## 2019-11-27 LAB — CBC WITH DIFFERENTIAL (CANCER CENTER ONLY)
Abs Immature Granulocytes: 0.2 10*3/uL — ABNORMAL HIGH (ref 0.00–0.07)
Basophils Absolute: 0.1 10*3/uL (ref 0.0–0.1)
Basophils Relative: 1 %
Eosinophils Absolute: 0.1 10*3/uL (ref 0.0–0.5)
Eosinophils Relative: 1 %
HCT: 30.1 % — ABNORMAL LOW (ref 39.0–52.0)
Hemoglobin: 9.8 g/dL — ABNORMAL LOW (ref 13.0–17.0)
Immature Granulocytes: 2 %
Lymphocytes Relative: 10 %
Lymphs Abs: 1 10*3/uL (ref 0.7–4.0)
MCH: 32.7 pg (ref 26.0–34.0)
MCHC: 32.6 g/dL (ref 30.0–36.0)
MCV: 100.3 fL — ABNORMAL HIGH (ref 80.0–100.0)
Monocytes Absolute: 1.3 10*3/uL — ABNORMAL HIGH (ref 0.1–1.0)
Monocytes Relative: 13 %
Neutro Abs: 7.4 10*3/uL (ref 1.7–7.7)
Neutrophils Relative %: 73 %
Platelet Count: 152 10*3/uL (ref 150–400)
RBC: 3 MIL/uL — ABNORMAL LOW (ref 4.22–5.81)
RDW: 17.9 % — ABNORMAL HIGH (ref 11.5–15.5)
WBC Count: 10 10*3/uL (ref 4.0–10.5)
nRBC: 0 % (ref 0.0–0.2)

## 2019-11-27 LAB — IRON AND TIBC
Iron: 77 ug/dL (ref 42–163)
Saturation Ratios: 35 % (ref 20–55)
TIBC: 223 ug/dL (ref 202–409)
UIBC: 145 ug/dL (ref 117–376)

## 2019-11-27 LAB — BASIC METABOLIC PANEL
BUN/Creatinine Ratio: 13 (ref 10–24)
BUN: 23 mg/dL (ref 8–27)
CO2: 26 mmol/L (ref 20–29)
Calcium: 9.2 mg/dL (ref 8.6–10.2)
Chloride: 100 mmol/L (ref 96–106)
Creatinine, Ser: 1.8 mg/dL — ABNORMAL HIGH (ref 0.76–1.27)
GFR calc Af Amer: 44 mL/min/{1.73_m2} — ABNORMAL LOW (ref >59)
GFR calc non Af Amer: 38 mL/min/{1.73_m2} — ABNORMAL LOW (ref >59)
Glucose: 142 mg/dL — ABNORMAL HIGH (ref 65–99)
Potassium: 4.1 mmol/L (ref 3.5–5.2)
Sodium: 142 mmol/L (ref 134–144)

## 2019-11-27 LAB — FERRITIN: Ferritin: 2330 ng/mL — ABNORMAL HIGH (ref 24–336)

## 2019-11-27 LAB — LACTATE DEHYDROGENASE: LDH: 254 U/L — ABNORMAL HIGH (ref 98–192)

## 2019-11-27 MED ORDER — SODIUM CHLORIDE 0.9 % IV SOLN
400.0000 mg/m2 | Freq: Once | INTRAVENOUS | Status: AC
  Administered 2019-11-27: 1000 mg via INTRAVENOUS
  Filled 2019-11-27: qty 10

## 2019-11-27 MED ORDER — HEPARIN SOD (PORK) LOCK FLUSH 100 UNIT/ML IV SOLN
500.0000 [IU] | Freq: Once | INTRAVENOUS | Status: AC | PRN
  Administered 2019-11-27: 500 [IU]
  Filled 2019-11-27: qty 5

## 2019-11-27 MED ORDER — PALONOSETRON HCL INJECTION 0.25 MG/5ML
0.2500 mg | Freq: Once | INTRAVENOUS | Status: AC
  Administered 2019-11-27: 0.25 mg via INTRAVENOUS

## 2019-11-27 MED ORDER — SODIUM CHLORIDE 0.9 % IV SOLN
10.0000 mg | Freq: Once | INTRAVENOUS | Status: AC
  Administered 2019-11-27: 10 mg via INTRAVENOUS
  Filled 2019-11-27: qty 10

## 2019-11-27 MED ORDER — SODIUM CHLORIDE 0.9 % IV SOLN
Freq: Once | INTRAVENOUS | Status: AC
  Filled 2019-11-27: qty 250

## 2019-11-27 MED ORDER — SODIUM CHLORIDE 0.9 % IV SOLN
Freq: Once | INTRAVENOUS | Status: AC
  Filled 2019-11-27: qty 60

## 2019-11-27 MED ORDER — PALONOSETRON HCL INJECTION 0.25 MG/5ML
INTRAVENOUS | Status: AC
  Filled 2019-11-27: qty 5

## 2019-11-27 MED ORDER — SODIUM CHLORIDE 0.9 % IV SOLN
INTRAVENOUS | Status: DC
  Filled 2019-11-27 (×2): qty 250

## 2019-11-27 MED ORDER — SODIUM CHLORIDE 0.9 % IV SOLN
500.0000 mg/m2 | Freq: Once | INTRAVENOUS | Status: AC
  Administered 2019-11-27: 1254 mg via INTRAVENOUS
  Filled 2019-11-27: qty 26.3

## 2019-11-27 MED ORDER — SODIUM CHLORIDE 0.9% FLUSH
10.0000 mL | INTRAVENOUS | Status: DC | PRN
  Administered 2019-11-27: 10 mL
  Filled 2019-11-27: qty 10

## 2019-11-27 NOTE — Patient Instructions (Signed)
[facility] Discharge Instructions for Patients Receiving Chemotherapy  Today you received the following chemotherapy agents Ifosphamide, Mesna, Gemzar  To help prevent nausea and vomiting after your treatment, we encourage you to take your nausea medication    If you develop nausea and vomiting that is not controlled by your nausea medication, call the clinic.   BELOW ARE SYMPTOMS THAT SHOULD BE REPORTED IMMEDIATELY:  *FEVER GREATER THAN 100.5 F  *CHILLS WITH OR WITHOUT FEVER  NAUSEA AND VOMITING THAT IS NOT CONTROLLED WITH YOUR NAUSEA MEDICATION  *UNUSUAL SHORTNESS OF BREATH  *UNUSUAL BRUISING OR BLEEDING  TENDERNESS IN MOUTH AND THROAT WITH OR WITHOUT PRESENCE OF ULCERS  *URINARY PROBLEMS  *BOWEL PROBLEMS  UNUSUAL RASH Items with * indicate a potential emergency and should be followed up as soon as possible.  Feel free to call the clinic should you have any questions or concerns. The clinic phone number is [phone].  Please show the [facility] at check-in to the Emergency Department and triage nurse.

## 2019-11-27 NOTE — Progress Notes (Signed)
Hematology and Oncology Follow Up Visit  [name] [id number] 11-Dec-1950 69 y.o. 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 Taxotere 80mg /m2 IV q 3 wks - s/p cycle #3 - d/c due to progression M-VAC - s/p cycle#8 -- d/c on 07/10/2018 due to blood counts Padcev -- s/p cycle #1 on 04/06/2020d/c 08/28/2018  Current Therapy: Ifosfamide/Gemzar -- s/p cycle #8- started 04/16/2019 IV iron as indicated   Interim History:  [name] is here today for follow-up and treatment.  He actually looks quite good.  He seems to be doing pretty well.  He has tolerated treatment well so far.  He has had some fatigue about a week afterwards.  He has little bit of nausea.  He does not get sick on his stomach.  His heart seems to be doing okay.  He is on Tikosyn now.  This is keeping him in rhythm.  He is on anticoagulation.  He has had no bleeding from this.  He has had no leg swelling.  He has had some arthritic issues.  He has a neuropathy in his legs.  He is yet to see the neurologist.  He and his wife will be going on vacation in September.  We will have to make an adjustment to his schedule.  Overall, I would have to say his performance status is ECOG 1.    Medications:  Allergies as of 11/27/2019   No Known Allergies     Medication List       Accurate as of November 27, 2019  9:05 AM. If you have any questions, ask your nurse or doctor.        acetaminophen 500 MG tablet Commonly known as: TYLENOL Take 1,000 mg by mouth every 6 (six) hours as needed for mild pain or headache.   amoxicillin 500 MG capsule Commonly known as: AMOXIL TAKE 4 CAPS (2,000 MG TOTAL) BY MOUTH ONCE FOR 1 DOSE. TAKE 1 HOUR PRIOR TO DENTAL PROCEDURE   apixaban 5 MG Tabs tablet Commonly known as: Eliquis Take 1 tablet (5 mg total)  by mouth 2 (two) times daily.   atorvastatin 40 MG tablet Commonly known as: LIPITOR Take 40 mg by mouth every evening.   dexamethasone 4 MG tablet Commonly known as: DECADRON Take 2 tablets (8 mg total) by mouth daily. Start the day after chemotherapy for 2 days. Take with food.   diltiazem 360 MG 24 hr capsule Commonly known as: CARDIZEM CD TAKE 1 CAPSULE BY MOUTH EVERY DAY What changed: how much to take   dofetilide 250 MCG capsule Commonly known as: TIKOSYN Take 1 capsule (250 mcg total) by mouth 2 (two) times daily.   DULoxetine 60 MG capsule Commonly known as: CYMBALTA Take 1 capsule (60 mg total) by mouth daily.   gabapentin 400 MG capsule Commonly known as: NEURONTIN TAKE 1 CAPSULE (400 MG TOTAL) BY MOUTH 4 (FOUR) TIMES DAILY. What changed:   how much to take  when to take this   K-Phos-Neutral [id number] MG Tabs Take 1 tablet by mouth 2 (two) times daily.   LORazepam 0.5 MG tablet Commonly known as: Ativan Take 1 tablet (0.5 mg total) by mouth every 6 (six) hours as needed (Nausea or vomiting). What changed: reasons to take this   magnesium oxide 400 MG tablet Commonly known as: MAG-OX Take 1 tablet (400  mg total) by mouth 2 (two) times daily.   metoprolol succinate 50 MG 24 hr tablet Commonly known as: TOPROL-XL Take 1 tablet (50 mg total) by mouth in the morning and at bedtime.   oxybutynin 10 MG 24 hr tablet Commonly known as: DITROPAN-XL Take 10 mg by mouth at bedtime.   potassium chloride 10 MEQ tablet Commonly known as: KLOR-CON Take 1 tablet (10 mEq total) by mouth daily.   pyridoxine 100 MG tablet Commonly known as: B-6 Take 200 mg by mouth daily with breakfast.   traMADol 50 MG tablet Commonly known as: ULTRAM Take 50 mg by mouth daily as needed (for pain).   VITAMIN D-3 PO Take 1 capsule by mouth daily with breakfast.       Allergies: No Known Allergies  Past Medical History, Surgical history, Social history, and Family  History were reviewed and updated.  Review of Systems: Review of Systems  Constitutional: Negative.   HENT: Negative.   Eyes: Negative.   Respiratory: Negative.   Cardiovascular: Positive for palpitations.  Gastrointestinal: Positive for nausea.  Genitourinary: Negative.   Musculoskeletal: Positive for myalgias.  Skin: Negative.   Neurological: Positive for tingling.  Endo/Heme/Allergies: Negative.   Psychiatric/Behavioral: Negative.      Physical Exam:  weight is 272 lb (123.4 kg). His oral temperature is 98.4 F (36.9 C). His blood pressure is 121/65 and his pulse is 75. His respiration is 18 and oxygen saturation is 98%.   Wt Readings from Last 3 Encounters:  11/27/19 272 lb (123.4 kg)  10/30/19 272 lb 6.4 oz (123.6 kg)  10/29/19 273 lb (123.8 kg)    Physical Exam Vitals reviewed.  HENT:     Head: Normocephalic and atraumatic.  Eyes:     Pupils: Pupils are equal, round, and reactive to light.  Cardiovascular:     Rate and Rhythm: Normal rate and regular rhythm.     Heart sounds: Normal heart sounds.  Pulmonary:     Effort: Pulmonary effort is normal.     Breath sounds: Normal breath sounds.  Abdominal:     General: Bowel sounds are normal.     Palpations: Abdomen is soft.  Musculoskeletal:        General: No tenderness or deformity. Normal range of motion.     Cervical back: Normal range of motion.  Lymphadenopathy:     Cervical: No cervical adenopathy.  Skin:    General: Skin is warm and dry.     Findings: No erythema or rash.  Neurological:     Mental Status: He is alert and oriented to person, place, and time.  Psychiatric:        Behavior: Behavior normal.        Thought Content: Thought content normal.        Judgment: Judgment normal.      Lab Results  Component Value Date   WBC 10.0 11/27/2019   HGB 9.8 (L) 11/27/2019   HCT 30.1 (L) 11/27/2019   MCV 100.3 (H) 11/27/2019   PLT 152 11/27/2019   Lab Results  Component Value Date    FERRITIN 2,489 (H) 10/30/2019   IRON 110 10/30/2019   TIBC 228 10/30/2019   UIBC 117 10/30/2019   IRONPCTSAT 48 10/30/2019   Lab Results  Component Value Date   RETICCTPCT 4.2 (H) 11/27/2019   RBC 3.01 (L) 11/27/2019   RBC 3.00 (L) 11/27/2019   No results found for: KPAFRELGTCHN, LAMBDASER, KAPLAMBRATIO No results found for: IGGSERUM, IGA, IGMSERUM No results  found for: [name], SPEI   Chemistry      Component Value Date/Time   NA 138 11/27/2019 0830   NA 142 11/26/2019 1328   NA 141 02/21/2017 0902   K 3.5 11/27/2019 0830   K 4.1 02/21/2017 0902   CL 100 11/27/2019 0830   CL 98 02/21/2017 0902   CO2 31 11/27/2019 0830   CO2 31 02/21/2017 0902   BUN 27 (H) 11/27/2019 0830   BUN 23 11/26/2019 1328   BUN 17 02/21/2017 0902   CREATININE 2.10 (H) 11/27/2019 0830   CREATININE 1.2 02/21/2017 0902      Component Value Date/Time   CALCIUM 9.1 11/27/2019 0830   CALCIUM 9.2 02/21/2017 0902   ALKPHOS 92 11/27/2019 0830   ALKPHOS 65 02/21/2017 0902   AST 22 11/27/2019 0830   ALT 19 11/27/2019 0830   ALT 18 02/21/2017 0902   BILITOT 0.5 11/27/2019 0830       Impression and Plan: Mr. Caltagirone is a very pleasant 69yo caucasian gentleman with metastatic high grade bladder cancer.  He has had recurrent disease.  So far, he seems to be doing fairly well with the ifosfamide/gemcitabine protocol.  We will go ahead and do another scan on him.  His last scan was back in May.  Hopefully, we will still see that there is no evidence of disease progression.  I just wish his neuropathy would get better.  I hate the fact that he has neuropathy.  He has been on multiple treatments without much success.  We will move his treatments back week so that he and his wife can go on vacation[place]  [place]  He [place].  We will plan for the scan to be done about 3 weeks.  I will see him back [place]. He seems to be doing well at this time. No heart symptoms. We will proceed with treatment today as planned per [name].  He will now go to monthly treatments. We will plan to see him again in August.  He can contact our office with any questions or concerns. We can certainly see him sooner if needed.   Volanda Napoleon, MD 8/17/20219:05 AM

## 2019-11-27 NOTE — Telephone Encounter (Signed)
Lvm of results and to contact clinic back to schedule lab work in 7-10 days

## 2019-11-28 ENCOUNTER — Inpatient Hospital Stay: Payer: PPO

## 2019-11-28 ENCOUNTER — Other Ambulatory Visit: Payer: Self-pay | Admitting: *Deleted

## 2019-11-28 ENCOUNTER — Other Ambulatory Visit: Payer: Self-pay

## 2019-11-28 VITALS — BP 127/70 | HR 88 | Temp 98.0°F | Resp 17

## 2019-11-28 DIAGNOSIS — C772 Secondary and unspecified malignant neoplasm of intra-abdominal lymph nodes: Secondary | ICD-10-CM

## 2019-11-28 DIAGNOSIS — C679 Malignant neoplasm of bladder, unspecified: Secondary | ICD-10-CM

## 2019-11-28 DIAGNOSIS — Z5111 Encounter for antineoplastic chemotherapy: Secondary | ICD-10-CM | POA: Diagnosis not present

## 2019-11-28 MED ORDER — SODIUM CHLORIDE 0.9 % IV SOLN
Freq: Once | INTRAVENOUS | Status: AC
  Filled 2019-11-28: qty 250

## 2019-11-28 MED ORDER — SODIUM CHLORIDE 0.9 % IV SOLN
400.0000 mg/m2 | Freq: Once | INTRAVENOUS | Status: AC
  Administered 2019-11-28: 1000 mg via INTRAVENOUS
  Filled 2019-11-28: qty 10

## 2019-11-28 MED ORDER — SODIUM CHLORIDE 0.9 % IV SOLN
Freq: Once | INTRAVENOUS | Status: AC
  Filled 2019-11-28: qty 60

## 2019-11-28 MED ORDER — SODIUM CHLORIDE 0.9 % IV SOLN
150.0000 mg | Freq: Once | INTRAVENOUS | Status: AC
  Administered 2019-11-28: 150 mg via INTRAVENOUS
  Filled 2019-11-28: qty 150

## 2019-11-28 MED ORDER — DEXAMETHASONE 4 MG PO TABS: 8.0000 mg | ORAL_TABLET | Freq: Every day | ORAL | 3 refills | Status: DC

## 2019-11-28 MED ORDER — SODIUM CHLORIDE 0.9 % IV SOLN
10.0000 mg | Freq: Once | INTRAVENOUS | Status: AC
  Administered 2019-11-28: 10 mg via INTRAVENOUS
  Filled 2019-11-28: qty 1

## 2019-11-28 MED ORDER — HEPARIN SOD (PORK) LOCK FLUSH 100 UNIT/ML IV SOLN
500.0000 [IU] | Freq: Once | INTRAVENOUS | Status: AC | PRN
  Administered 2019-11-28: 500 [IU]
  Filled 2019-11-28: qty 5

## 2019-11-28 MED ORDER — SODIUM CHLORIDE 0.9% FLUSH
10.0000 mL | INTRAVENOUS | Status: DC | PRN
  Administered 2019-11-28: 10 mL
  Filled 2019-11-28: qty 10

## 2019-11-28 NOTE — Patient Instructions (Signed)
[facility] Discharge Instructions for Patients Receiving Chemotherapy  Today you received the following chemotherapy agents Ifosphamide and Mesna  To help prevent nausea and vomiting after your treatment, we encourage you to take your nausea medication    If you develop nausea and vomiting that is not controlled by your nausea medication, call the clinic.   BELOW ARE SYMPTOMS THAT SHOULD BE REPORTED IMMEDIATELY:  *FEVER GREATER THAN 100.5 F  *CHILLS WITH OR WITHOUT FEVER  NAUSEA AND VOMITING THAT IS NOT CONTROLLED WITH YOUR NAUSEA MEDICATION  *UNUSUAL SHORTNESS OF BREATH  *UNUSUAL BRUISING OR BLEEDING  TENDERNESS IN MOUTH AND THROAT WITH OR WITHOUT PRESENCE OF ULCERS  *URINARY PROBLEMS  *BOWEL PROBLEMS  UNUSUAL RASH Items with * indicate a potential emergency and should be followed up as soon as possible.  Feel free to call the clinic should you have any questions or concerns. The clinic phone number is [phone].  Please show the [facility] at check-in to the Emergency Department and triage nurse.

## 2019-11-29 ENCOUNTER — Inpatient Hospital Stay: Payer: PPO

## 2019-11-29 VITALS — BP 135/57 | HR 75 | Temp 98.6°F | Resp 17

## 2019-11-29 DIAGNOSIS — C679 Malignant neoplasm of bladder, unspecified: Secondary | ICD-10-CM

## 2019-11-29 DIAGNOSIS — M6281 Muscle weakness (generalized): Secondary | ICD-10-CM | POA: Diagnosis not present

## 2019-11-29 DIAGNOSIS — Z5111 Encounter for antineoplastic chemotherapy: Secondary | ICD-10-CM | POA: Diagnosis not present

## 2019-11-29 DIAGNOSIS — C772 Secondary and unspecified malignant neoplasm of intra-abdominal lymph nodes: Secondary | ICD-10-CM

## 2019-11-29 MED ORDER — SODIUM CHLORIDE 0.9 % IV SOLN
400.0000 mg/m2 | Freq: Once | INTRAVENOUS | Status: AC
  Administered 2019-11-29: 1000 mg via INTRAVENOUS
  Filled 2019-11-29: qty 10

## 2019-11-29 MED ORDER — PEGFILGRASTIM 6 MG/0.6ML ~~LOC~~ PSKT
PREFILLED_SYRINGE | SUBCUTANEOUS | Status: AC
  Filled 2019-11-29: qty 0.6

## 2019-11-29 MED ORDER — SODIUM CHLORIDE 0.9% FLUSH
10.0000 mL | Freq: Once | INTRAVENOUS | Status: AC
  Administered 2019-11-29: 10 mL via INTRAVENOUS
  Filled 2019-11-29: qty 10

## 2019-11-29 MED ORDER — SODIUM CHLORIDE 0.9 % IV SOLN
Freq: Once | INTRAVENOUS | Status: AC
  Filled 2019-11-29: qty 60

## 2019-11-29 MED ORDER — PALONOSETRON HCL INJECTION 0.25 MG/5ML
INTRAVENOUS | Status: AC
  Filled 2019-11-29: qty 5

## 2019-11-29 MED ORDER — PALONOSETRON HCL INJECTION 0.25 MG/5ML
0.2500 mg | Freq: Once | INTRAVENOUS | Status: AC
  Administered 2019-11-29: 0.25 mg via INTRAVENOUS

## 2019-11-29 MED ORDER — SODIUM CHLORIDE 0.9 % IV SOLN
10.0000 mg | Freq: Once | INTRAVENOUS | Status: AC
  Administered 2019-11-29: 10 mg via INTRAVENOUS
  Filled 2019-11-29: qty 10

## 2019-11-29 MED ORDER — PEGFILGRASTIM 6 MG/0.6ML ~~LOC~~ PSKT
6.0000 mg | PREFILLED_SYRINGE | Freq: Once | SUBCUTANEOUS | Status: AC
  Administered 2019-11-29: 6 mg via SUBCUTANEOUS

## 2019-11-29 MED ORDER — HEPARIN SOD (PORK) LOCK FLUSH 100 UNIT/ML IV SOLN
500.0000 [IU] | Freq: Once | INTRAVENOUS | Status: AC
  Administered 2019-11-29: 500 [IU] via INTRAVENOUS
  Filled 2019-11-29: qty 5

## 2019-11-29 MED ORDER — SODIUM CHLORIDE 0.9 % IV SOLN
Freq: Once | INTRAVENOUS | Status: AC
  Filled 2019-11-29: qty 250

## 2019-11-29 NOTE — Patient Instructions (Signed)
[facility] Discharge Instructions for Patients Receiving Chemotherapy  Today you received the following chemotherapy agents Ifosphamide and Mesna  To help prevent nausea and vomiting after your treatment, we encourage you to take your nausea medication    If you develop nausea and vomiting that is not controlled by your nausea medication, call the clinic.   BELOW ARE SYMPTOMS THAT SHOULD BE REPORTED IMMEDIATELY:  *FEVER GREATER THAN 100.5 F  *CHILLS WITH OR WITHOUT FEVER  NAUSEA AND VOMITING THAT IS NOT CONTROLLED WITH YOUR NAUSEA MEDICATION  *UNUSUAL SHORTNESS OF BREATH  *UNUSUAL BRUISING OR BLEEDING  TENDERNESS IN MOUTH AND THROAT WITH OR WITHOUT PRESENCE OF ULCERS  *URINARY PROBLEMS  *BOWEL PROBLEMS  UNUSUAL RASH Items with * indicate a potential emergency and should be followed up as soon as possible.  Feel free to call the clinic should you have any questions or concerns. The clinic phone number is [phone].  Please show the [facility] at check-in to the Emergency Department and triage nurse.

## 2019-12-07 ENCOUNTER — Other Ambulatory Visit: Payer: PPO | Admitting: *Deleted

## 2019-12-07 ENCOUNTER — Other Ambulatory Visit: Payer: Self-pay

## 2019-12-07 DIAGNOSIS — Z79899 Other long term (current) drug therapy: Secondary | ICD-10-CM | POA: Diagnosis not present

## 2019-12-08 LAB — BASIC METABOLIC PANEL
BUN/Creatinine Ratio: 14 (ref 10–24)
BUN: 26 mg/dL (ref 8–27)
CO2: 25 mmol/L (ref 20–29)
Calcium: 8.6 mg/dL (ref 8.6–10.2)
Chloride: 100 mmol/L (ref 96–106)
Creatinine, Ser: 1.86 mg/dL — ABNORMAL HIGH (ref 0.76–1.27)
GFR calc Af Amer: 42 mL/min/{1.73_m2} — ABNORMAL LOW (ref >59)
GFR calc non Af Amer: 36 mL/min/{1.73_m2} — ABNORMAL LOW (ref >59)
Glucose: 156 mg/dL — ABNORMAL HIGH (ref 65–99)
Potassium: 3.4 mmol/L — ABNORMAL LOW (ref 3.5–5.2)
Sodium: 135 mmol/L (ref 134–144)

## 2019-12-12 ENCOUNTER — Telehealth: Payer: Self-pay | Admitting: Physician Assistant

## 2019-12-12 ENCOUNTER — Other Ambulatory Visit: Payer: Self-pay | Admitting: Physician Assistant

## 2019-12-12 DIAGNOSIS — Z79899 Other long term (current) drug therapy: Secondary | ICD-10-CM

## 2019-12-12 NOTE — Telephone Encounter (Signed)
Called patient regarding his last BMET.   He is taking the K+ 26meq alternating days with 33meq as instructed. Maybe not eating quite as well or much as usual in general.  He does not think he has had Afib  Will take 33meq today-Friday and  Then resume 45meq alt/w 26meq and recheck on Tuesday  Tommye Standard, PA-C

## 2019-12-14 ENCOUNTER — Other Ambulatory Visit: Payer: Self-pay

## 2019-12-14 ENCOUNTER — Encounter (HOSPITAL_COMMUNITY): Payer: Self-pay | Admitting: Nurse Practitioner

## 2019-12-14 ENCOUNTER — Ambulatory Visit (HOSPITAL_COMMUNITY)
Admission: RE | Admit: 2019-12-14 | Discharge: 2019-12-14 | Disposition: A | Discharge status: Home / Self Care | Payer: PPO | Source: Ambulatory Visit | Attending: Nurse Practitioner | Admitting: Nurse Practitioner

## 2019-12-14 VITALS — BP 108/70 | HR 84 | Ht 74.0 in | Wt 267.4 lb

## 2019-12-14 DIAGNOSIS — Z9079 Acquired absence of other genital organ(s): Secondary | ICD-10-CM | POA: Insufficient documentation

## 2019-12-14 DIAGNOSIS — Z8546 Personal history of malignant neoplasm of prostate: Secondary | ICD-10-CM | POA: Diagnosis not present

## 2019-12-14 DIAGNOSIS — Z7901 Long term (current) use of anticoagulants: Secondary | ICD-10-CM | POA: Diagnosis not present

## 2019-12-14 DIAGNOSIS — N183 Chronic kidney disease, stage 3 unspecified: Secondary | ICD-10-CM | POA: Diagnosis not present

## 2019-12-14 DIAGNOSIS — Z9841 Cataract extraction status, right eye: Secondary | ICD-10-CM | POA: Insufficient documentation

## 2019-12-14 DIAGNOSIS — Z961 Presence of intraocular lens: Secondary | ICD-10-CM | POA: Insufficient documentation

## 2019-12-14 DIAGNOSIS — I440 Atrioventricular block, first degree: Secondary | ICD-10-CM | POA: Diagnosis not present

## 2019-12-14 DIAGNOSIS — Z96653 Presence of artificial knee joint, bilateral: Secondary | ICD-10-CM | POA: Insufficient documentation

## 2019-12-14 DIAGNOSIS — C772 Secondary and unspecified malignant neoplasm of intra-abdominal lymph nodes: Secondary | ICD-10-CM | POA: Insufficient documentation

## 2019-12-14 DIAGNOSIS — D6869 Other thrombophilia: Secondary | ICD-10-CM

## 2019-12-14 DIAGNOSIS — E785 Hyperlipidemia, unspecified: Secondary | ICD-10-CM | POA: Diagnosis not present

## 2019-12-14 DIAGNOSIS — Z9842 Cataract extraction status, left eye: Secondary | ICD-10-CM | POA: Insufficient documentation

## 2019-12-14 DIAGNOSIS — Z79899 Other long term (current) drug therapy: Secondary | ICD-10-CM | POA: Insufficient documentation

## 2019-12-14 DIAGNOSIS — I493 Ventricular premature depolarization: Secondary | ICD-10-CM | POA: Diagnosis not present

## 2019-12-14 DIAGNOSIS — D649 Anemia, unspecified: Secondary | ICD-10-CM | POA: Diagnosis not present

## 2019-12-14 DIAGNOSIS — I129 Hypertensive chronic kidney disease with stage 1 through stage 4 chronic kidney disease, or unspecified chronic kidney disease: Secondary | ICD-10-CM | POA: Insufficient documentation

## 2019-12-14 DIAGNOSIS — R7303 Prediabetes: Secondary | ICD-10-CM | POA: Diagnosis not present

## 2019-12-14 DIAGNOSIS — Z87891 Personal history of nicotine dependence: Secondary | ICD-10-CM | POA: Insufficient documentation

## 2019-12-14 DIAGNOSIS — M1990 Unspecified osteoarthritis, unspecified site: Secondary | ICD-10-CM | POA: Diagnosis not present

## 2019-12-14 DIAGNOSIS — C679 Malignant neoplasm of bladder, unspecified: Secondary | ICD-10-CM | POA: Diagnosis not present

## 2019-12-14 DIAGNOSIS — I4819 Other persistent atrial fibrillation: Secondary | ICD-10-CM | POA: Insufficient documentation

## 2019-12-14 DIAGNOSIS — Z8249 Family history of ischemic heart disease and other diseases of the circulatory system: Secondary | ICD-10-CM | POA: Insufficient documentation

## 2019-12-14 DIAGNOSIS — Z96642 Presence of left artificial hip joint: Secondary | ICD-10-CM | POA: Diagnosis not present

## 2019-12-14 NOTE — Progress Notes (Signed)
Primary Care Physician: Shirline Frees, MD Referring Physician: [name] Cardiologist: [name]  Oncologist : [name] Urologist: [name] is a 69 y.o. 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 as pt had recent cardioversion with ERAF. In his note he suggested use of flecainide and recent stress test was low risk on review of [name].   The pt does c/o of a lot of fatigue but has other issues as well contributing. He is rate controlled. He is on eliquis 5 mg bid for a CHA2DS2VASc Score of 2. Heis pending a bladder resection on 4/26. His last creatinine was 2.11.  Pt had pending resection of bladder pending 4/26. His BB was increased prior to surgery to have better rate control. It was not an appropriate time to try to restore SR as I am sure chemical or electrical cardioversion would have been needed and that meant he would have to delay surgery as he would not be able to come off anticoagulation x 4 weeks. He would not have been able to use flecainide as it interfered with 4 of his medicines.   He returns today, 08/15/19 as physical therapy found his HR increased too much with activity and he  paused therapy. He is rate controlled today at 92 mbp. His BP is soft at 106/64, unable to increase rate control for this finding. He started back on anticoagulation right after his bladder resection 4/26 but had to stop it the following  Monday as he had increase hematuria. Hematuria  is improved off anticoagulation. He is c/o of being short of breath with exertion and weak, more so since his procedure. Marland Kitchen He feels terrible. He rolled out of bed last pm hitting his left eye on the bed stand and scraping his  rt knee. He was aware of his surroundings when he hit the floor. No  LOC. His hgb was found to be 7.5 and urology arranged for an outpt transfusion.   He is now  returned to afib clinic, 6/1,  to discuss tikosyn admit. Marland Kitchen He is feeling well better since infusion. He has seen minimal hematuria. He continues with fatigue and exertional dyspnea. He chemo was put on hold 2 weeks ago as he felt poorly. He feels it will resume next week. He remains in rate control afib. He has received both covid shots.   F/u in afib clinic for tikosyn admit, 6/15. He was to have his chemo last week but the oncologist said that he would delay until after Tikosyn load. No benadryl or no missed  DOAC for 3 weeks. No significant hematuria.   F/u in afib clinic, 6/25. He is here one week s/p Tikosyn. He had steroids and chemo on  Tues/Wed  but when he went on Thursday, he was noted to have bigeminy PVC's and chemo was not given. He was in rhythm. He does feel better in SR. He can walk further without any shortness of breath. The PVC's have been present before Tikosyn start and he was often in bigeminy PVC's while in the hospital, qtc is stable. I discussed with Dr.Allred, he suggested an updated echo but otherwise is not overly concerned as they were there  prior to tikosyn.   F/u in afib clinic, 11/13/19. He is being seen as he felt poorly yesterday and noted HR of 120 after walking. He was  questioning if he was back in Afib. His EKG shows SR today.  He has not had chemo x 2 weeks. Last  HGB also showed  a drop to 9.8 from  11.8 one month earlier at the [facility].  He also had a low K+, being treated with extra K+ with plans to recheck next week, being followed by Tommye Standard, PA. He continues on dofetilide. and apixaban for a CHA2DS2VASc of 2.   Today, he denies symptoms of palpitations, chest pain, shortness of breath, orthopnea, PND, lower extremity edema, dizziness, presyncope, syncope, or neurologic sequela.++ fatigue. The patient is tolerating medications without difficulties and is otherwise without complaint today.   Past Medical History:  Diagnosis Date  . Anemia   . Arthritis    . 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 ([place]) 09/29/2016  . Bladder tumor   . Chronic kidney disease    ckd stage 3, sees dr every 6 months, arf hemodialysis done x 2 2020  . Dyspnea    with exertion  . Dysrhythmia    atrial fib  . Essential hypertension 06/23/2018  . Goals of care, counseling/discussion 09/30/2016  . History of prostate cancer followed by pcp dr [name]-  per pt last PSA undetectable   dx 2008-- (Stage T1c, Gleason 3+3,  PSA 4.58, vol 99cc)  s/p  radical prostatectomy (nerve sparing bilateral)   . Hyperglycemia 06/23/2018  . Hypertension   . Mild hyperlipidemia 06/23/2018  . Pre-diabetes   . Wears glasses    Past Surgical History:  Procedure Laterality Date  . CARDIOVERSION N/A 05/18/2019   Procedure: CARDIOVERSION;  Surgeon: Lelon Perla, MD;  Location: [facility] ENDOSCOPY;  Service: Cardiovascular;  Laterality: N/A;  . CARDIOVERSION N/A 09/27/2019   Procedure: CARDIOVERSION;  Surgeon: Donato Heinz, MD;  Location: [place];  Service: Cardiovascular;  Laterality: N/A;  . CATARACT EXTRACTION W/ INTRAOCULAR LENS  IMPLANT, BILATERAL Bilateral 2011  . [place]  . IR FLUORO GUIDE PORT INSERTION RIGHT  10/07/2016  . IR RADIOLOGIST EVAL & MGMT  01/05/2018  . IR US GUIDE VASC ACCESS RIGHT  10/07/2016  . KNEE ARTHROSCOPY Bilateral right 2006;  left 02-15-2007  . [place];  1990;  1983  . ROBOT ASSISTED LAPAROSCOPIC RADICAL PROSTATECTOMY  06/20/2006   bilateral nerve sparing  . TEE WITHOUT CARDIOVERSION N/A 06/27/2018   Procedure: TRANSESOPHAGEAL ECHOCARDIOGRAM (TEE);  Surgeon: Nigel Mormon, MD;  Location: [facility] ENDOSCOPY;  Service: Cardiovascular;  Laterality: N/A;  . TOTAL HIP ARTHROPLASTY Left 03/03/2015   Procedure: LEFT TOTAL HIP ARTHROPLASTY ANTERIOR APPROACH;  Surgeon: Dorna Leitz, MD;  Location: [place];  Service: Orthopedics;  Laterality: Left;  . TOTAL KNEE ARTHROPLASTY Bilateral left 08-13-2009;   right 12-26-2009  . TRANSURETHRAL RESECTION OF BLADDER TUMOR N/A 09/20/2016   Procedure: TRANSURETHRAL RESECTION OF BLADDER TUMOR (TURBT);  Surgeon: Franchot Gallo, MD;  Location: [facility];  Service: Urology;  Laterality: N/A;  . TRANSURETHRAL RESECTION OF BLADDER TUMOR WITH MITOMYCIN-C N/A 08/06/2019   Procedure: TRANSURETHRAL RESECTION OF BLADDER TUMOR;  Surgeon: Franchot Gallo, MD;  Location: [facility];  Service: Urology;  Laterality: N/A;    Current Outpatient Medications  Medication Sig Dispense Refill  . acetaminophen (TYLENOL) 500 MG tablet Take 1,000 mg by mouth every 6 (six) hours as needed for mild pain or headache.    Marland Kitchen amoxicillin (AMOXIL) 500 MG capsule TAKE 4 CAPS (2,000 MG TOTAL) BY MOUTH ONCE FOR 1 DOSE. TAKE 1  HOUR PRIOR TO DENTAL PROCEDURE 4 capsule 4  . apixaban (ELIQUIS) 5 MG TABS tablet Take 1 tablet (5 mg total) by mouth 2 (two) times daily. 60 tablet 6  . atorvastatin (LIPITOR) 40 MG tablet Take 40 mg by mouth every evening.     . Cholecalciferol (VITAMIN D-3 PO) Take 1 capsule by mouth daily with breakfast.    . dexamethasone (DECADRON) 4 MG tablet Take 2 tablets (8 mg total) by mouth daily. Start the day after chemotherapy for 2 days. Take with food. 30 tablet 3  . diltiazem (CARDIZEM CD) 360 MG 24 hr capsule TAKE 1 CAPSULE BY MOUTH EVERY DAY (Patient taking differently: Take 360 mg by mouth daily. ) 90 capsule 1  . dofetilide (TIKOSYN) 250 MCG capsule Take 1 capsule (250 mcg total) by mouth 2 (two) times daily. 60 capsule 6  . DULoxetine (CYMBALTA) 60 MG capsule Take 1 capsule (60 mg total) by mouth daily. 30 capsule 3  . gabapentin (NEURONTIN) 400 MG capsule TAKE 1 CAPSULE (400 MG TOTAL) BY MOUTH 4 (FOUR) TIMES DAILY. (Patient taking differently: Take 800 mg by mouth daily. ) 360 capsule 2  . K Phos Mono-Sod Phos Di & Mono (K-PHOS-NEUTRAL) [phone] MG TABS Take 1 tablet by mouth 2 (two) times daily.    Marland Kitchen LORazepam (ATIVAN) 0.5  MG tablet Take 1 tablet (0.5 mg total) by mouth every 6 (six) hours as needed (Nausea or vomiting). (Patient taking differently: Take 0.5 mg by mouth every 6 (six) hours as needed (for nausea or vomiting). ) 30 tablet 0  . magnesium oxide (MAG-OX) 400 MG tablet Take 1 tablet (400 mg total) by mouth 2 (two) times daily.    . metoprolol succinate (TOPROL-XL) 50 MG 24 hr tablet Take 1 tablet (50 mg total) by mouth in the morning and at bedtime. 60 tablet 3  . oxybutynin (DITROPAN-XL) 10 MG 24 hr tablet Take 10 mg by mouth at bedtime.    . potassium chloride (KLOR-CON) 10 MEQ tablet Take 1 tablet (10 mEq total) by mouth daily. 30 tablet 3  . pyridoxine (B-6) 100 MG tablet Take 200 mg by mouth daily with breakfast.     . traMADol (ULTRAM) 50 MG tablet Take 50 mg by mouth daily as needed (for pain).      No current facility-administered medications for this encounter.   Facility-Administered Medications Ordered in Other Encounters  Medication Dose Route Frequency Provider Last Rate Last Admin  . sodium chloride flush (NS) 0.9 % injection 10 mL  10 mL Intravenous PRN [place], Holli Humbles, NP   10 mL at 02/21/17 1038  . sodium chloride flush (NS) 0.9 % injection 10 mL  10 mL Intravenous PRN Volanda Napoleon, MD   10 mL at 07/10/18 0844  . sodium chloride flush (NS) 0.9 % injection 10 mL  10 mL Intravenous PRN Eliezer Bottom, NP   10 mL at 09/18/19 8938    No Known Allergies  Social History   Socioeconomic History  . Marital status: Married    Spouse name: Not on file  . Number of children: Not on file  . Years of education: Not on file  . Highest education level: Not on file  Occupational History  . Not on file  Tobacco Use  . Smoking status: Former Smoker    Packs/day: 1.00    Years: 16.00    Pack years: 16.00    Types: Cigarettes    Quit date: 09/25/1984    Years since  quitting: 35.2  . Smokeless tobacco: Never Used  Vaping Use  . Vaping Use: Never used  Substance and Sexual  Activity  . Alcohol use: Yes    Comment: occasionally  . Drug use: Yes    Types: Marijuana    Comment: after chemo to help sleep  . Sexual activity: Not on file  Other Topics Concern  . Not on file  Social History Narrative  . Not on file   Social Determinants of Health   Financial Resource Strain:   . Difficulty of Paying Living Expenses: Not on file  Food Insecurity:   . Worried About Charity fundraiser in the Last Year: Not on file  . Ran Out of Food in the Last Year: Not on file  Transportation Needs:   . Lack of Transportation (Medical): Not on file  . Lack of Transportation (Non-Medical): Not on file  Physical Activity:   . Days of Exercise per Week: Not on file  . Minutes of Exercise per Session: Not on file  Stress:   . Feeling of Stress : Not on file  Social Connections:   . Frequency of Communication with Friends and Family: Not on file  . Frequency of Social Gatherings with Friends and Family: Not on file  . Attends Religious Services: Not on file  . Active Member of Clubs or Organizations: Not on file  . Attends Archivist Meetings: Not on file  . Marital Status: Not on file  Intimate Partner Violence:   . Fear of Current or Ex-Partner: Not on file  . Emotionally Abused: Not on file  . Physically Abused: Not on file  . Sexually Abused: Not on file    Family History  Problem Relation Age of Onset  . Hypertension Mother   . Aneurysm Mother   . Emphysema Father   . Hypertension Father     ROS- All systems are reviewed and negative except as per the HPI above  Physical Exam: Vitals:   12/14/19 0954  BP: 108/70  Pulse: 84  Weight: 121.3 kg  Height: $Remove'6\' 2"'bAWIhHL$  (1.88 m)   Wt Readings from Last 3 Encounters:  12/14/19 121.3 kg  11/27/19 123.4 kg  10/30/19 123.6 kg    Labs: Lab Results  Component Value Date   NA 135 12/07/2019   K 3.4 (L) 12/07/2019   CL 100 12/07/2019   CO2 25 12/07/2019   GLUCOSE 156 (H) 12/07/2019   BUN 26  12/07/2019   CREATININE 1.86 (H) 12/07/2019   CALCIUM 8.6 12/07/2019   PHOS 3.2 10/14/2018   MG 2.0 10/29/2019   Lab Results  Component Value Date   INR 1.3 (H) 09/26/2019   Lab Results  Component Value Date   CHOL 159 10/05/2018   HDL 47 10/05/2018   LDLCALC 86 10/05/2018   TRIG 131 10/05/2018     GEN- The patient is well appearing, alert and oriented x 3 today.   Head- normocephalic, atraumatic Eyes-  Sclera clear, conjunctiva pink Ears- hearing intact Oropharynx- clear Neck- supple, no JVP Lymph- no cervical lymphadenopathy Lungs- Clear to ausculation bilaterally, normal work of breathing Heart- regular rate and rhythm(pvc's), no murmurs, rubs or gallops, PMI not laterally displaced GI- soft, NT, ND, + BS Extremities- no clubbing, cyanosis, or edema MS- no significant deformity or atrophy Skin- no rash or lesion Psych- euthymic mood, full affect Neuro- strength and sensation are intact  EKG-SR with first degree AV block  pr int 252 ms, qrs int 98 ms,  qtc 489 ms  Epic records reviewed  Echo-1. The left ventricle has normal systolic function, with an ejection  fraction of 55-60%. The cavity size was normal. Left ventricular diastolic  Doppler parameters are consistent with impaired relaxation.  2. The right ventricle has normal systolic function. The cavity was  mildly enlarged. There is no increase in right ventricular wall thickness.  3. Right atrial size was mildly dilated.  4. Moderate thickening of the aortic valve Mild calcification of the  aortic valve.  5. There is dilatation of the aortic root measuring 39 mm.  6. No vegetations detected.   Assessment and Plan: 1.  Persistent  Afib Had an episode of heart racing yesterday, afraid he was in afib  He is in rhythm, reassured  Continue  Metoprolol 50 mg bid    Continue  dofetilide 250 mcg bid  Discussed use of Liborio Negron Torres so he can get rhythm strips at home if interested   K+ recently below   normal range,  supplemented and pending recheck 9/7  2. CHA2DS2VASc score of 2 Continue eliquis 5 mg bid   3. Bladder CA Per urology and oncology   4. PVC's  Present prior to Tikosyn start Quiet today   5. Recent drop in HGB form 11.8 to 9.8 Pending recheck in [place]   F/u in [place] clinic in October as scheduled    Butch Penny C. Lory Galan, [facility] [address] [place], [place] [address] [phone]

## 2019-12-18 ENCOUNTER — Other Ambulatory Visit: Payer: PPO | Admitting: *Deleted

## 2019-12-18 ENCOUNTER — Other Ambulatory Visit: Payer: Self-pay

## 2019-12-18 DIAGNOSIS — Z79899 Other long term (current) drug therapy: Secondary | ICD-10-CM

## 2019-12-19 ENCOUNTER — Other Ambulatory Visit: Payer: PPO

## 2019-12-19 ENCOUNTER — Other Ambulatory Visit: Payer: Self-pay | Admitting: *Deleted

## 2019-12-19 ENCOUNTER — Telehealth: Payer: Self-pay | Admitting: Physician Assistant

## 2019-12-19 LAB — BASIC METABOLIC PANEL
BUN/Creatinine Ratio: 12 (ref 10–24)
BUN: 25 mg/dL (ref 8–27)
CO2: 27 mmol/L (ref 20–29)
Calcium: 9.1 mg/dL (ref 8.6–10.2)
Chloride: 99 mmol/L (ref 96–106)
Creatinine, Ser: 2.13 mg/dL — ABNORMAL HIGH (ref 0.76–1.27)
GFR calc Af Amer: 36 mL/min/{1.73_m2} — ABNORMAL LOW (ref >59)
GFR calc non Af Amer: 31 mL/min/{1.73_m2} — ABNORMAL LOW (ref >59)
Glucose: 150 mg/dL — ABNORMAL HIGH (ref 65–99)
Potassium: 3.8 mmol/L (ref 3.5–5.2)
Sodium: 140 mmol/L (ref 134–144)

## 2019-12-19 MED ORDER — POTASSIUM CHLORIDE ER 10 MEQ PO TBCR: 10.0000 meq | EXTENDED_RELEASE_TABLET | Freq: Every day | ORAL | 3 refills | Status: DC

## 2019-12-19 NOTE — Telephone Encounter (Signed)
Patient's wife calling to speak with Shana. She states she spoke with her earlier and has questions.

## 2019-12-20 ENCOUNTER — Other Ambulatory Visit: Payer: Self-pay

## 2019-12-20 ENCOUNTER — Ambulatory Visit (HOSPITAL_COMMUNITY)
Admission: RE | Admit: 2019-12-20 | Discharge: 2019-12-20 | Disposition: A | Discharge status: Home / Self Care | Payer: PPO | Source: Ambulatory Visit | Attending: Hematology & Oncology | Admitting: Hematology & Oncology

## 2019-12-20 DIAGNOSIS — Z5111 Encounter for antineoplastic chemotherapy: Secondary | ICD-10-CM | POA: Diagnosis not present

## 2019-12-20 DIAGNOSIS — C772 Secondary and unspecified malignant neoplasm of intra-abdominal lymph nodes: Secondary | ICD-10-CM

## 2019-12-20 DIAGNOSIS — C679 Malignant neoplasm of bladder, unspecified: Secondary | ICD-10-CM | POA: Diagnosis not present

## 2019-12-20 DIAGNOSIS — I700 Atherosclerosis of aorta: Secondary | ICD-10-CM | POA: Diagnosis not present

## 2019-12-20 LAB — GLUCOSE, CAPILLARY: Glucose-Capillary: 168 mg/dL — ABNORMAL HIGH (ref 70–99)

## 2019-12-20 MED ORDER — FLUDEOXYGLUCOSE F - 18 (FDG) INJECTION
13.3100 | Freq: Once | INTRAVENOUS | Status: AC | PRN
  Administered 2019-12-20: 13.31 via INTRAVENOUS

## 2019-12-21 ENCOUNTER — Inpatient Hospital Stay (HOSPITAL_BASED_OUTPATIENT_CLINIC_OR_DEPARTMENT_OTHER): Payer: PPO | Admitting: Hematology & Oncology

## 2019-12-21 ENCOUNTER — Inpatient Hospital Stay: Payer: PPO | Attending: Hematology & Oncology

## 2019-12-21 ENCOUNTER — Inpatient Hospital Stay: Payer: PPO

## 2019-12-21 VITALS — BP 110/66 | HR 90 | Temp 99.1°F | Resp 17 | Wt 269.5 lb

## 2019-12-21 DIAGNOSIS — C772 Secondary and unspecified malignant neoplasm of intra-abdominal lymph nodes: Secondary | ICD-10-CM

## 2019-12-21 DIAGNOSIS — I4891 Unspecified atrial fibrillation: Secondary | ICD-10-CM | POA: Insufficient documentation

## 2019-12-21 DIAGNOSIS — C679 Malignant neoplasm of bladder, unspecified: Secondary | ICD-10-CM | POA: Insufficient documentation

## 2019-12-21 DIAGNOSIS — D508 Other iron deficiency anemias: Secondary | ICD-10-CM

## 2019-12-21 DIAGNOSIS — Z7901 Long term (current) use of anticoagulants: Secondary | ICD-10-CM | POA: Insufficient documentation

## 2019-12-21 DIAGNOSIS — Z95828 Presence of other vascular implants and grafts: Secondary | ICD-10-CM

## 2019-12-21 DIAGNOSIS — K5780 Diverticulitis of intestine, part unspecified, with perforation and abscess without bleeding: Secondary | ICD-10-CM | POA: Diagnosis not present

## 2019-12-21 DIAGNOSIS — D631 Anemia in chronic kidney disease: Secondary | ICD-10-CM | POA: Diagnosis not present

## 2019-12-21 DIAGNOSIS — N289 Disorder of kidney and ureter, unspecified: Secondary | ICD-10-CM | POA: Insufficient documentation

## 2019-12-21 DIAGNOSIS — C774 Secondary and unspecified malignant neoplasm of inguinal and lower limb lymph nodes: Secondary | ICD-10-CM | POA: Insufficient documentation

## 2019-12-21 DIAGNOSIS — Z5112 Encounter for antineoplastic immunotherapy: Secondary | ICD-10-CM | POA: Insufficient documentation

## 2019-12-21 DIAGNOSIS — C7889 Secondary malignant neoplasm of other digestive organs: Secondary | ICD-10-CM | POA: Diagnosis not present

## 2019-12-21 DIAGNOSIS — Z79899 Other long term (current) drug therapy: Secondary | ICD-10-CM | POA: Diagnosis not present

## 2019-12-21 LAB — CBC WITH DIFFERENTIAL (CANCER CENTER ONLY)
Abs Immature Granulocytes: 0.19 10*3/uL — ABNORMAL HIGH (ref 0.00–0.07)
Basophils Absolute: 0.1 10*3/uL (ref 0.0–0.1)
Basophils Relative: 1 %
Eosinophils Absolute: 0.1 10*3/uL (ref 0.0–0.5)
Eosinophils Relative: 1 %
HCT: 29.8 % — ABNORMAL LOW (ref 39.0–52.0)
Hemoglobin: 9.7 g/dL — ABNORMAL LOW (ref 13.0–17.0)
Immature Granulocytes: 2 %
Lymphocytes Relative: 10 %
Lymphs Abs: 1 10*3/uL (ref 0.7–4.0)
MCH: 34.6 pg — ABNORMAL HIGH (ref 26.0–34.0)
MCHC: 32.6 g/dL (ref 30.0–36.0)
MCV: 106.4 fL — ABNORMAL HIGH (ref 80.0–100.0)
Monocytes Absolute: 1.4 10*3/uL — ABNORMAL HIGH (ref 0.1–1.0)
Monocytes Relative: 14 %
Neutro Abs: 7.4 10*3/uL (ref 1.7–7.7)
Neutrophils Relative %: 72 %
Platelet Count: 144 10*3/uL — ABNORMAL LOW (ref 150–400)
RBC: 2.8 MIL/uL — ABNORMAL LOW (ref 4.22–5.81)
RDW: 19.2 % — ABNORMAL HIGH (ref 11.5–15.5)
WBC Count: 10.2 10*3/uL (ref 4.0–10.5)
nRBC: 0.2 % (ref 0.0–0.2)

## 2019-12-21 LAB — CMP (CANCER CENTER ONLY)
ALT: 24 U/L (ref 0–44)
AST: 25 U/L (ref 15–41)
Albumin: 3.8 g/dL (ref 3.5–5.0)
Alkaline Phosphatase: 111 U/L (ref 38–126)
Anion gap: 10 (ref 5–15)
BUN: 26 mg/dL — ABNORMAL HIGH (ref 8–23)
CO2: 28 mmol/L (ref 22–32)
Calcium: 9.2 mg/dL (ref 8.9–10.3)
Chloride: 101 mmol/L (ref 98–111)
Creatinine: 2.25 mg/dL — ABNORMAL HIGH (ref 0.61–1.24)
GFR, Est AFR Am: 33 mL/min — ABNORMAL LOW (ref >60)
GFR, Estimated: 29 mL/min — ABNORMAL LOW (ref >60)
Glucose, Bld: 270 mg/dL — ABNORMAL HIGH (ref 70–99)
Potassium: 3.7 mmol/L (ref 3.5–5.1)
Sodium: 139 mmol/L (ref 135–145)
Total Bilirubin: 0.6 mg/dL (ref 0.3–1.2)
Total Protein: 5.8 g/dL — ABNORMAL LOW (ref 6.5–8.1)

## 2019-12-21 LAB — SAMPLE TO BLOOD BANK

## 2019-12-21 MED ORDER — SODIUM CHLORIDE 0.9% FLUSH
10.0000 mL | Freq: Once | INTRAVENOUS | Status: AC
  Administered 2019-12-21: 10 mL via INTRAVENOUS
  Filled 2019-12-21: qty 10

## 2019-12-21 MED ORDER — SODIUM CHLORIDE 0.9% FLUSH
3.0000 mL | Freq: Once | INTRAVENOUS | Status: DC | PRN
  Filled 2019-12-21: qty 10

## 2019-12-21 MED ORDER — HEPARIN SOD (PORK) LOCK FLUSH 100 UNIT/ML IV SOLN
500.0000 [IU] | Freq: Once | INTRAVENOUS | Status: AC
  Administered 2019-12-21: 500 [IU] via INTRAVENOUS
  Filled 2019-12-21: qty 5

## 2019-12-21 MED ORDER — HEPARIN SOD (PORK) LOCK FLUSH 100 UNIT/ML IV SOLN
250.0000 [IU] | Freq: Once | INTRAVENOUS | Status: DC | PRN
  Filled 2019-12-21: qty 5

## 2019-12-21 NOTE — Progress Notes (Signed)
DISCONTINUE OFF PATHWAY REGIMEN - Bladder   [id number]:ICE (Ifosfamide D1,2,3) q21 Days (Outpatient):   A cycle is every 21 days:     Ifosfamide      Mesna      Mesna      Carboplatin      Etoposide      [id number]   **Always confirm dose/schedule in your pharmacy ordering system**  REASON: Disease Progression PRIOR TREATMENT: Off Pathway: ICE (Ifosfamide D1,2,3) q21 Days (Outpatient) TREATMENT RESPONSE: Partial Response (PR)  START ON PATHWAY REGIMEN - Bladder     A cycle is every 21 days:     Sacituzumab govitecan-hziy   **Always confirm dose/schedule in your pharmacy ordering system**  Patient Characteristics: Advanced/Metastatic Disease, Third Line and Beyond Therapeutic Status: Advanced/Metastatic Disease Line of Therapy: Third Line and Beyond  Intent of Therapy: Non-Curative / Palliative Intent, Discussed with Patient

## 2019-12-21 NOTE — Progress Notes (Signed)
Hematology and Oncology Follow Up Visit  [name] Rose [id number] 1950/05/23 69 y.o. 12/21/2019   Principle Diagnosis:  Metastatic high-grade bladder cancer - recurrent Anemia of renal insufficiency Diverticular abscess - E.coli  Past Therapy: Atezolizumab 1200mg  IV q 3 wks - s/p cycle #4 - d/c due to progression. Taxotere 80mg /m2 IV q 3 wks - s/p cycle #3 - d/c due to progression M-VAC - s/p cycle#8 -- d/c on 07/10/2018 due to blood counts Padcev -- s/p cycle #1 on 04/06/2020d/c 08/28/2018  Current Therapy: Trodelvy8 mg/kg IV d1&8 -- start cycle #1 on 12/31/2019 Aranesp 300 mcg sq q 3 week for Hgb < 11 Ifosfamide/Gemzar -- s/p cycle #9- started 04/16/2019  -- d/c on 12/21/2019 due to progression  IV iron as indicated   Interim History:  [name] Rose is here today for follow-up.  Unfortunately, we now have progressive disease.  We went ahead and did a PET scan on him.  This was done yesterday.  He has new areas of disease.  Surprisingly one area is in the gallbladder.  Second area is in the right inguinal lymph node.  I must say that he is gone a lot of "mileage" out of the ifosfamide/Gemzar combination.  We started this back in January.  He is having no problems with pain.  He does get fatigued.  He has atrial fibrillation.  He is on Tikosyn.  I think he is in rhythm right now.  He is on anticoagulation.    He does have anemia.  He does have a low erythropoietin level of only 48.  This is low for his degree of anemia.  I suspect with all of his chemotherapy, he probably has a degree of myelodysplasia also.  I think that Aranesp would be very appropriate for him.  I do not see any obvious contraindication.  He did have a very nice beach trip to [facility].  He and his wife went down.  He did not do a lot while he was down there but he enjoyed it.  He is eating well.  He has had no problems with bowels or bladder.  He has had no cough.  He is no obvious  bleeding.  Currently, his performance status is ECOG 1.  Medications:  Allergies as of 12/21/2019   No Known Allergies     Medication List       Accurate as of December 21, 2019  5:42 PM. If you have any questions, ask your nurse or doctor.        acetaminophen 500 MG tablet Commonly known as: TYLENOL Take 1,000 mg by mouth every 6 (six) hours as needed for mild pain or headache.   amoxicillin 500 MG capsule Commonly known as: AMOXIL TAKE 4 CAPS (2,000 MG TOTAL) BY MOUTH ONCE FOR 1 DOSE. TAKE 1 HOUR PRIOR TO DENTAL PROCEDURE   apixaban 5 MG Tabs tablet Commonly known as: Eliquis Take 1 tablet (5 mg total) by mouth 2 (two) times daily.   atorvastatin 40 MG tablet Commonly known as: LIPITOR Take 40 mg by mouth every evening.   dexamethasone 4 MG tablet Commonly known as: DECADRON Take 2 tablets (8 mg total) by mouth daily. Start the day after chemotherapy for 2 days. Take with food.   diltiazem 360 MG 24 hr capsule Commonly known as: CARDIZEM CD TAKE 1 CAPSULE BY MOUTH EVERY DAY What changed: how much to take   dofetilide 250 MCG capsule Commonly known as: TIKOSYN Take 1 capsule (250 mcg total)  by mouth 2 (two) times daily.   DULoxetine 60 MG capsule Commonly known as: CYMBALTA Take 1 capsule (60 mg total) by mouth daily.   gabapentin 400 MG capsule Commonly known as: NEURONTIN TAKE 1 CAPSULE (400 MG TOTAL) BY MOUTH 4 (FOUR) TIMES DAILY. What changed:   how much to take  when to take this   K-Phos-Neutral [id number] MG Tabs Take 1 tablet by mouth 2 (two) times daily.   LORazepam 0.5 MG tablet Commonly known as: Ativan Take 1 tablet (0.5 mg total) by mouth every 6 (six) hours as needed (Nausea or vomiting). What changed: reasons to take this   magnesium oxide 400 MG tablet Commonly known as: MAG-OX Take 1 tablet (400 mg total) by mouth 2 (two) times daily.   metoprolol succinate 50 MG 24 hr tablet Commonly known as: TOPROL-XL Take 1 tablet (50 mg  total) by mouth in the morning and at bedtime.   oxybutynin 10 MG 24 hr tablet Commonly known as: DITROPAN-XL Take 10 mg by mouth at bedtime.   potassium chloride 10 MEQ tablet Commonly known as: KLOR-CON Take 1 tablet (10 mEq total) by mouth daily. TAKE 20 MEQ 2 DAYS THEN  10 MEQ 3RD DAY ALTERNATING   pyridoxine 100 MG tablet Commonly known as: B-6 Take 200 mg by mouth daily with breakfast.   traMADol 50 MG tablet Commonly known as: ULTRAM Take 50 mg by mouth daily as needed (for pain).   VITAMIN D-3 PO Take 1 capsule by mouth daily with breakfast.       Allergies: No Known Allergies  Past Medical History, Surgical history, Social history, and Family History were reviewed and updated.  Review of Systems: Review of Systems  Constitutional: Negative.   HENT: Negative.   Eyes: Negative.   Respiratory: Negative.   Cardiovascular: Positive for palpitations.  Gastrointestinal: Positive for nausea.  Genitourinary: Negative.   Musculoskeletal: Positive for myalgias.  Skin: Negative.   Neurological: Positive for tingling.  Endo/Heme/Allergies: Negative.   Psychiatric/Behavioral: Negative.      Physical Exam:  weight is 269 lb 8 oz (122.2 kg). His oral temperature is 99.1 F (37.3 C). His blood pressure is 110/66 and his pulse is 90. His respiration is 17 and oxygen saturation is 99%.   Wt Readings from Last 3 Encounters:  12/21/19 269 lb 8 oz (122.2 kg)  12/14/19 267 lb 6.4 oz (121.3 kg)  11/27/19 272 lb (123.4 kg)    Physical Exam Vitals reviewed.  HENT:     Head: Normocephalic and atraumatic.  Eyes:     Pupils: Pupils are equal, round, and reactive to light.  Cardiovascular:     Rate and Rhythm: Normal rate and regular rhythm.     Heart sounds: Normal heart sounds.  Pulmonary:     Effort: Pulmonary effort is normal.     Breath sounds: Normal breath sounds.  Abdominal:     General: Bowel sounds are normal.     Palpations: Abdomen is soft.    Musculoskeletal:        General: No tenderness or deformity. Normal range of motion.     Cervical back: Normal range of motion.  Lymphadenopathy:     Cervical: No cervical adenopathy.  Skin:    General: Skin is warm and dry.     Findings: No erythema or rash.  Neurological:     Mental Status: He is alert and oriented to person, place, and time.  Psychiatric:        Behavior: Behavior  normal.        Thought Content: Thought content normal.        Judgment: Judgment normal.      Lab Results  Component Value Date   WBC 10.2 12/21/2019   HGB 9.7 (L) 12/21/2019   HCT 29.8 (L) 12/21/2019   MCV 106.4 (H) 12/21/2019   PLT 144 (L) 12/21/2019   Lab Results  Component Value Date   FERRITIN 2,330 (H) 11/27/2019   IRON 77 11/27/2019   TIBC 223 11/27/2019   UIBC 145 11/27/2019   IRONPCTSAT 35 11/27/2019   Lab Results  Component Value Date   RETICCTPCT 4.2 (H) 11/27/2019   RBC 2.80 (L) 12/21/2019   No results found for: KPAFRELGTCHN, LAMBDASER, KAPLAMBRATIO No results found for: IGGSERUM, IGA, IGMSERUM No results found for: [name], SPEI   Chemistry      Component Value Date/Time   NA 139 12/21/2019 1033   NA 140 12/18/2019 1411   NA 141 02/21/2017 0902   K 3.7 12/21/2019 1033   K 4.1 02/21/2017 0902   CL 101 12/21/2019 1033   CL 98 02/21/2017 0902   CO2 28 12/21/2019 1033   CO2 31 02/21/2017 0902   BUN 26 (H) 12/21/2019 1033   BUN 25 12/18/2019 1411   BUN 17 02/21/2017 0902   CREATININE 2.25 (H) 12/21/2019 1033   CREATININE 1.2 02/21/2017 0902      Component Value Date/Time   CALCIUM 9.2 12/21/2019 1033   CALCIUM 9.2 02/21/2017 0902   ALKPHOS 111 12/21/2019 1033   ALKPHOS 65 02/21/2017 0902   AST 25 12/21/2019 1033   ALT 24 12/21/2019 1033   ALT 18 02/21/2017 0902   BILITOT 0.6 12/21/2019 1033       Impression and Plan: [name] Rose is a very pleasant 70yo caucasian gentleman with metastatic high  grade bladder cancer.  He now has progressive disease.  Again he had done quite well with the ifosfamide/gemcitabine protocol.  The FDA recently approved Trodelvy for recurrent bladder cancer.  I think this would be very appropriate for [name] Rose.  I think toxicity would be reasonable.  I will certainly dose reduce a little bit because of his past treatment.  I spent a good 45 minutes with [name] Rose.  I explained why we had to make the change.  I reviewed the PET scan with him.  I explained the role of Trodelvy.  He still has a good performance status.  Does have the neuropathy but he seems to be managing.  We will start treatment on September 20.  I probably would give him 3 cycles of treatment and then repeat his PET scan.  He will also need Aranesp for his anemia.  It would not surprise me if we had to give him a transfusion.  We will have to watch out for pancytopenia with this protocol.  I will plan to see him back when he starts the second cycle of treatment of Ivette Loyal on October 11.     Volanda Napoleon, MD 9/10/20215:42 PM

## 2019-12-21 NOTE — Patient Instructions (Signed)

## 2019-12-21 NOTE — Addendum Note (Signed)
Addended by: Lucile Crater on: 12/21/2019 11:58 AM   Modules accepted: Orders

## 2019-12-24 LAB — IRON AND TIBC
Iron: 103 ug/dL (ref 42–163)
Saturation Ratios: 44 % (ref 20–55)
TIBC: 233 ug/dL (ref 202–409)
UIBC: 129 ug/dL (ref 117–376)

## 2019-12-24 LAB — FERRITIN: Ferritin: 2848 ng/mL — ABNORMAL HIGH (ref 24–336)

## 2019-12-24 NOTE — Progress Notes (Signed)
Pharmacist Chemotherapy Monitoring - Initial Assessment    Anticipated start date: 12/31/19  Regimen:   Are orders appropriate based on the patients diagnosis, regimen, and cycle? Yes  Does the plan date match the patients scheduled date? Yes  Is the sequencing of drugs appropriate? Yes  Are the premedications appropriate for the patients regimen? Yes  Prior Authorization for treatment is: Not Started o If applicable, is the correct biosimilar selected based on the patient's insurance? not applicable  Organ Function and Labs:  Are dose adjustments needed based on the patient's renal function, hepatic function, or hematologic function? No  Are appropriate labs ordered prior to the start of patient's treatment? Yes  Other organ system assessment, if indicated: N/A  The following baseline labs, if indicated, have been ordered: N/A  Dose Assessment:  Are the drug doses appropriate? Yes  Are the following correct: o Drug concentrations Yes o IV fluid compatible with drug Yes o Administration routes Yes o Timing of therapy Yes  If applicable, does the patient have documented access for treatment and/or plans for port-a-cath placement? Yes  If applicable, have lifetime cumulative doses been properly documented and assessed? not applicable Lifetime Dose Tracking   Doxorubicin: 144.878 mg/m2 (480 mg)  = 32.2 % of the maximum lifetime dose of 450 mg/m2 -- Actual total dose might be higher than it appears here, due to incomplete documentation  o   Toxicity Monitoring/Prevention:  The patient has the following take home antiemetics prescribed: Ondansetron, prochlorperazine, dexamethasone, lorazepam  The patient has the following take home medications prescribed: diarrhea prophylaxis  Medication allergies and previous infusion related reactions, if applicable, have been reviewed and addressed. Yes  The patient's current medication list has been assessed for drug-drug  interactions with their chemotherapy regimen. no significant drug-drug interactions were identified on review.  Order Review:  Are the treatment plan orders signed? Yes  Is the patient scheduled to see a provider prior to their treatment? Yes  I verify that I have reviewed each item in the above checklist and answered each question accordingly.  Patt Steinhardt, [name] 12/24/2019 8:03 AM

## 2019-12-25 ENCOUNTER — Ambulatory Visit: Payer: PPO | Admitting: Hematology & Oncology

## 2019-12-25 ENCOUNTER — Other Ambulatory Visit: Payer: PPO

## 2019-12-25 ENCOUNTER — Ambulatory Visit: Payer: PPO

## 2019-12-26 ENCOUNTER — Ambulatory Visit: Payer: PPO

## 2019-12-27 ENCOUNTER — Ambulatory Visit: Payer: PPO

## 2019-12-30 DIAGNOSIS — M6281 Muscle weakness (generalized): Secondary | ICD-10-CM | POA: Diagnosis not present

## 2019-12-31 ENCOUNTER — Telehealth: Payer: Self-pay

## 2019-12-31 ENCOUNTER — Inpatient Hospital Stay: Payer: PPO

## 2019-12-31 ENCOUNTER — Other Ambulatory Visit: Payer: Self-pay

## 2019-12-31 ENCOUNTER — Inpatient Hospital Stay (HOSPITAL_BASED_OUTPATIENT_CLINIC_OR_DEPARTMENT_OTHER): Payer: PPO | Admitting: Hematology & Oncology

## 2019-12-31 ENCOUNTER — Telehealth: Payer: Self-pay | Admitting: *Deleted

## 2019-12-31 ENCOUNTER — Other Ambulatory Visit: Payer: Self-pay | Admitting: Hematology & Oncology

## 2019-12-31 VITALS — BP 110/61 | HR 86 | Temp 98.3°F | Resp 18

## 2019-12-31 VITALS — Temp 98.3°F | Wt 275.5 lb

## 2019-12-31 DIAGNOSIS — C772 Secondary and unspecified malignant neoplasm of intra-abdominal lymph nodes: Secondary | ICD-10-CM

## 2019-12-31 DIAGNOSIS — Z5112 Encounter for antineoplastic immunotherapy: Secondary | ICD-10-CM | POA: Diagnosis not present

## 2019-12-31 DIAGNOSIS — D500 Iron deficiency anemia secondary to blood loss (chronic): Secondary | ICD-10-CM | POA: Diagnosis not present

## 2019-12-31 DIAGNOSIS — C679 Malignant neoplasm of bladder, unspecified: Secondary | ICD-10-CM

## 2019-12-31 DIAGNOSIS — D508 Other iron deficiency anemias: Secondary | ICD-10-CM

## 2019-12-31 LAB — CBC WITH DIFFERENTIAL (CANCER CENTER ONLY)
Abs Immature Granulocytes: 0.06 10*3/uL (ref 0.00–0.07)
Basophils Absolute: 0 10*3/uL (ref 0.0–0.1)
Basophils Relative: 0 %
Eosinophils Absolute: 0.1 10*3/uL (ref 0.0–0.5)
Eosinophils Relative: 2 %
HCT: 28.7 % — ABNORMAL LOW (ref 39.0–52.0)
Hemoglobin: 9.3 g/dL — ABNORMAL LOW (ref 13.0–17.0)
Immature Granulocytes: 1 %
Lymphocytes Relative: 13 %
Lymphs Abs: 1.1 10*3/uL (ref 0.7–4.0)
MCH: 35 pg — ABNORMAL HIGH (ref 26.0–34.0)
MCHC: 32.4 g/dL (ref 30.0–36.0)
MCV: 107.9 fL — ABNORMAL HIGH (ref 80.0–100.0)
Monocytes Absolute: 0.8 10*3/uL (ref 0.1–1.0)
Monocytes Relative: 10 %
Neutro Abs: 6.3 10*3/uL (ref 1.7–7.7)
Neutrophils Relative %: 74 %
Platelet Count: 147 10*3/uL — ABNORMAL LOW (ref 150–400)
RBC: 2.66 MIL/uL — ABNORMAL LOW (ref 4.22–5.81)
RDW: 17 % — ABNORMAL HIGH (ref 11.5–15.5)
WBC Count: 8.4 10*3/uL (ref 4.0–10.5)
nRBC: 0 % (ref 0.0–0.2)

## 2019-12-31 LAB — CMP (CANCER CENTER ONLY)
ALT: 19 U/L (ref 0–44)
AST: 21 U/L (ref 15–41)
Albumin: 3.6 g/dL (ref 3.5–5.0)
Alkaline Phosphatase: 84 U/L (ref 38–126)
Anion gap: 8 (ref 5–15)
BUN: 32 mg/dL — ABNORMAL HIGH (ref 8–23)
CO2: 27 mmol/L (ref 22–32)
Calcium: 8.8 mg/dL — ABNORMAL LOW (ref 8.9–10.3)
Chloride: 102 mmol/L (ref 98–111)
Creatinine: 2.28 mg/dL — ABNORMAL HIGH (ref 0.61–1.24)
GFR, Est AFR Am: 33 mL/min — ABNORMAL LOW (ref >60)
GFR, Estimated: 28 mL/min — ABNORMAL LOW (ref >60)
Glucose, Bld: 228 mg/dL — ABNORMAL HIGH (ref 70–99)
Potassium: 3.5 mmol/L (ref 3.5–5.1)
Sodium: 137 mmol/L (ref 135–145)
Total Bilirubin: 0.5 mg/dL (ref 0.3–1.2)
Total Protein: 5.7 g/dL — ABNORMAL LOW (ref 6.5–8.1)

## 2019-12-31 LAB — IRON AND TIBC
Iron: 104 ug/dL (ref 42–163)
Saturation Ratios: 47 % (ref 20–55)
TIBC: 223 ug/dL (ref 202–409)
UIBC: 118 ug/dL (ref 117–376)

## 2019-12-31 LAB — FERRITIN: Ferritin: 2201 ng/mL — ABNORMAL HIGH (ref 24–336)

## 2019-12-31 MED ORDER — SODIUM CHLORIDE 0.9 % IV SOLN
Freq: Once | INTRAVENOUS | Status: AC
  Filled 2019-12-31: qty 250

## 2019-12-31 MED ORDER — SODIUM CHLORIDE 0.9 % IV SOLN
150.0000 mg | Freq: Once | INTRAVENOUS | Status: AC
  Administered 2019-12-31: 150 mg via INTRAVENOUS
  Filled 2019-12-31: qty 5

## 2019-12-31 MED ORDER — CYCLOBENZAPRINE HCL 7.5 MG PO TABS: 7.5000 mg | ORAL_TABLET | Freq: Three times a day (TID) | ORAL | 2 refills | Status: DC | PRN

## 2019-12-31 MED ORDER — POTASSIUM CHLORIDE ER 10 MEQ PO TBCR: 20.0000 meq | EXTENDED_RELEASE_TABLET | Freq: Every day | ORAL | Status: DC

## 2019-12-31 MED ORDER — DIPHENHYDRAMINE HCL 50 MG/ML IJ SOLN
50.0000 mg | Freq: Once | INTRAMUSCULAR | Status: AC
  Administered 2019-12-31: 50 mg via INTRAVENOUS

## 2019-12-31 MED ORDER — LOPERAMIDE HCL 2 MG PO TABS: ORAL_TABLET | ORAL | 1 refills | Status: DC

## 2019-12-31 MED ORDER — FAMOTIDINE IN NACL 20-0.9 MG/50ML-% IV SOLN
20.0000 mg | Freq: Once | INTRAVENOUS | Status: AC
  Administered 2019-12-31: 20 mg via INTRAVENOUS

## 2019-12-31 MED ORDER — DARBEPOETIN ALFA 300 MCG/0.6ML IJ SOSY
PREFILLED_SYRINGE | INTRAMUSCULAR | Status: AC
  Filled 2019-12-31: qty 0.6

## 2019-12-31 MED ORDER — SODIUM CHLORIDE 0.9 % IV SOLN
8.0000 mg/kg | Freq: Once | INTRAVENOUS | Status: AC
  Administered 2019-12-31: 980 mg via INTRAVENOUS
  Filled 2019-12-31: qty 98

## 2019-12-31 MED ORDER — ACETAMINOPHEN 325 MG PO TABS
ORAL_TABLET | ORAL | Status: AC
  Filled 2019-12-31: qty 2

## 2019-12-31 MED ORDER — PALONOSETRON HCL INJECTION 0.25 MG/5ML
0.2500 mg | Freq: Once | INTRAVENOUS | Status: AC
  Administered 2019-12-31: 0.25 mg via INTRAVENOUS

## 2019-12-31 MED ORDER — PALONOSETRON HCL INJECTION 0.25 MG/5ML
INTRAVENOUS | Status: AC
  Filled 2019-12-31: qty 5

## 2019-12-31 MED ORDER — LORAZEPAM 0.5 MG PO TABS: 0.5000 mg | ORAL_TABLET | Freq: Four times a day (QID) | ORAL | 0 refills | Status: DC | PRN

## 2019-12-31 MED ORDER — ATROPINE SULFATE 1 MG/ML IJ SOLN: 0.5000 mg | Freq: Once | INTRAMUSCULAR | Status: DC | PRN

## 2019-12-31 MED ORDER — LIDOCAINE-PRILOCAINE 2.5-2.5 % EX CREA: TOPICAL_CREAM | CUTANEOUS | 3 refills | Status: DC

## 2019-12-31 MED ORDER — DEXAMETHASONE 4 MG PO TABS: ORAL_TABLET | ORAL | 1 refills | Status: DC

## 2019-12-31 MED ORDER — SODIUM CHLORIDE 0.9% FLUSH
10.0000 mL | INTRAVENOUS | Status: DC | PRN
  Administered 2019-12-31: 10 mL
  Filled 2019-12-31: qty 10

## 2019-12-31 MED ORDER — PROCHLORPERAZINE MALEATE 10 MG PO TABS: 10.0000 mg | ORAL_TABLET | Freq: Four times a day (QID) | ORAL | 1 refills | Status: DC | PRN

## 2019-12-31 MED ORDER — SODIUM CHLORIDE 0.9 % IV SOLN
10.0000 mg | Freq: Once | INTRAVENOUS | Status: AC
  Administered 2019-12-31: 10 mg via INTRAVENOUS
  Filled 2019-12-31: qty 10

## 2019-12-31 MED ORDER — HEPARIN SOD (PORK) LOCK FLUSH 100 UNIT/ML IV SOLN
500.0000 [IU] | Freq: Once | INTRAVENOUS | Status: AC | PRN
  Administered 2019-12-31: 500 [IU]
  Filled 2019-12-31: qty 5

## 2019-12-31 MED ORDER — ACETAMINOPHEN 325 MG PO TABS
650.0000 mg | ORAL_TABLET | Freq: Once | ORAL | Status: AC
  Administered 2019-12-31: 650 mg via ORAL

## 2019-12-31 MED ORDER — DIPHENHYDRAMINE HCL 50 MG/ML IJ SOLN
INTRAMUSCULAR | Status: AC
  Filled 2019-12-31: qty 1

## 2019-12-31 MED ORDER — ATROPINE SULFATE 1 MG/ML IJ SOLN
INTRAMUSCULAR | Status: AC
  Filled 2019-12-31: qty 1

## 2019-12-31 MED ORDER — DARBEPOETIN ALFA 300 MCG/0.6ML IJ SOSY
300.0000 ug | PREFILLED_SYRINGE | Freq: Once | INTRAMUSCULAR | Status: AC
  Administered 2019-12-31: 300 ug via SUBCUTANEOUS

## 2019-12-31 MED ORDER — FAMOTIDINE IN NACL 20-0.9 MG/50ML-% IV SOLN
INTRAVENOUS | Status: AC
  Filled 2019-12-31: qty 50

## 2019-12-31 MED ORDER — ONDANSETRON HCL 8 MG PO TABS: 8.0000 mg | ORAL_TABLET | Freq: Two times a day (BID) | ORAL | 1 refills | Status: DC | PRN

## 2019-12-31 NOTE — Telephone Encounter (Signed)
Patient still in Infusion, informed of iron results. Pt verbalized understanding.

## 2019-12-31 NOTE — Telephone Encounter (Signed)
-----   Message from [place], [place] sent at 12/31/2019 12:23 PM EDT ----- Potassium 3.6 today, lets move to potassium 34meq daily, looks like he has labs scheduled in a week, will follow those results   Joseph Art

## 2019-12-31 NOTE — Progress Notes (Signed)
Reviewed pt labs with [name] and pt ok to treat with creatinine 2.28

## 2019-12-31 NOTE — Telephone Encounter (Signed)
-----   Message from Volanda Napoleon, MD sent at 12/31/2019  1:59 PM EDT ----- Call - the iron level is good!!  [name]

## 2019-12-31 NOTE — Telephone Encounter (Signed)
Spoke with patient wife and aware about results and verbalized understanding  for Magnesium and to start taking Potassium 20 meq daily and will monitor labs from next draw 9-27.

## 2019-12-31 NOTE — Telephone Encounter (Signed)
Merry Proud from [facility]  called stating there is a contraindication with the compazine he prescribed and dofetilide(prescribed by another provider) and causes QT prolongation. Informed Dr.Ennever who states to cancel the compazine rx. Informed Merry Proud at the pharamcy.

## 2019-12-31 NOTE — Addendum Note (Signed)
Addended by: Volanda Napoleon on: 12/31/2019 03:38 PM   Modules accepted: Orders

## 2019-12-31 NOTE — Progress Notes (Signed)
Hematology and Oncology Follow Up Visit  [name] Rose [id number] [birth year] 69 y.o. 12/31/2019   Principle Diagnosis:  Metastatic high-grade bladder cancer - recurrent Anemia of renal insufficiency Diverticular abscess - E.coli  Past Therapy: Atezolizumab 1200mg  IV q 3 wks - s/p cycle #4 - d/c due to progression. Taxotere 80mg /m2 IV q 3 wks - s/p cycle #3 - d/c due to progression M-VAC - s/p cycle#8 -- d/c on 07/10/2018 due to blood counts Padcev -- s/p cycle #1 on 04/06/2020d/c 08/28/2018  Current Therapy: Trodelvy8 mg/kg IV d1&8 -- start cycle #1 on 12/31/2019 Aranesp 300 mcg sq q 3 week for Hgb < 11 Ifosfamide/Gemzar -- s/p cycle #9- started 04/16/2019  -- d/c on 12/21/2019 due to progression  IV iron as indicated   Interim History:  [name] Rose is here today for follow-up.  He did have [place] time [place] the beach.  He was down there recently.  He enjoyed himself.  It was a little bit warm.  However, he had a good time.  He still gets tired with activity.  He is little bit anemic.  He will get Aranesp today.  When I listen to his heart, it certainly sounds like he has some atrial fibrillation.  We tried on many occasions to get an EKG on him.  We could not do this.  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  Clinically, this is probably the biggest issue for him.  He has had no bleeding.  He has had no change in bowel or bladder habits.  He has had no nausea or vomiting.  There is no headache.  He has had no cough.  Overall, his performance status is ECOG 1.    Medications:  Allergies as of 12/31/2019   No Known Allergies     Medication List       Accurate as of December 31, 2019 10:04 AM. If you have any questions, ask your nurse or doctor.        acetaminophen 500 MG tablet Commonly known as: TYLENOL Take 1,000 mg by mouth every 6 (six) hours as needed for mild pain or headache.   amoxicillin 500 MG capsule Commonly  known as: AMOXIL TAKE 4 CAPS (2,000 MG TOTAL) BY MOUTH ONCE FOR 1 DOSE. TAKE 1 HOUR PRIOR TO DENTAL PROCEDURE   apixaban 5 MG Tabs tablet Commonly known as: Eliquis Take 1 tablet (5 mg total) by mouth 2 (two) times daily.   atorvastatin 40 MG tablet Commonly known as: LIPITOR Take 40 mg by mouth every evening.   dexamethasone 4 MG tablet Commonly known as: DECADRON Take 2 tablets (8 mg) daily for 3 days after chemotherapy. Take with food.   diltiazem 360 MG 24 hr capsule Commonly known as: CARDIZEM CD TAKE 1 CAPSULE BY MOUTH EVERY DAY What changed: how much to take   dofetilide 250 MCG capsule Commonly known as: TIKOSYN Take 1 capsule (250 mcg total) by mouth 2 (two) times daily.   DULoxetine 60 MG capsule Commonly known as: CYMBALTA Take 1 capsule (60 mg total) by mouth daily.   gabapentin 400 MG capsule Commonly known as: NEURONTIN TAKE 1 CAPSULE (400 MG TOTAL) BY MOUTH 4 (FOUR) TIMES DAILY. What changed:   how much to take  when to take this   K-Phos-Neutral 155-852-130 MG Tabs Take 1 tablet by mouth 2 (two) times daily.   lidocaine-prilocaine cream Commonly known as: EMLA Apply to affected area once   loperamide 2  MG tablet Commonly known as: IMODIUM A-D Take 2 at diarrhea onset, then 1 every 2hr until 12hrs with no BM. May take 2 every 4hrs at night. If diarrhea recurs repeat.   LORazepam 0.5 MG tablet Commonly known as: Ativan Take 1 tablet (0.5 mg total) by mouth every 6 (six) hours as needed (Nausea or vomiting).   magnesium oxide 400 MG tablet Commonly known as: MAG-OX Take 1 tablet (400 mg total) by mouth 2 (two) times daily.   metoprolol succinate 50 MG 24 hr tablet Commonly known as: TOPROL-XL Take 1 tablet (50 mg total) by mouth in the morning and at bedtime.   ondansetron 8 MG tablet Commonly known as: Zofran Take 1 tablet (8 mg total) by mouth 2 (two) times daily as needed. Start on the third day after chemotherapy.   oxybutynin 10 MG 24  hr tablet Commonly known as: DITROPAN-XL Take 10 mg by mouth at bedtime.   potassium chloride 10 MEQ tablet Commonly known as: KLOR-CON Take 1 tablet (10 mEq total) by mouth daily. TAKE 20 MEQ 2 DAYS THEN  10 MEQ 3RD DAY ALTERNATING   prochlorperazine 10 MG tablet Commonly known as: COMPAZINE Take 1 tablet (10 mg total) by mouth every 6 (six) hours as needed (Nausea or vomiting).   pyridoxine 100 MG tablet Commonly known as: B-6 Take 200 mg by mouth daily with breakfast.   traMADol 50 MG tablet Commonly known as: ULTRAM Take 50 mg by mouth daily as needed (for pain).   VITAMIN D-3 PO Take 1 capsule by mouth daily with breakfast.       Allergies: No Known Allergies  Past Medical History, Surgical history, Social history, and Family History were reviewed and updated.  Review of Systems: Review of Systems  Constitutional: Negative.   HENT: Negative.   Eyes: Negative.   Respiratory: Negative.   Cardiovascular: Positive for palpitations.  Gastrointestinal: Positive for nausea.  Genitourinary: Negative.   Musculoskeletal: Positive for myalgias.  Skin: Negative.   Neurological: Positive for tingling.  Endo/Heme/Allergies: Negative.   Psychiatric/Behavioral: Negative.      Physical Exam:  weight is 275 lb 8 oz (125 kg). His oral temperature is 98.3 F (36.8 C).   Wt Readings from Last 3 Encounters:  12/31/19 275 lb 8 oz (125 kg)  12/21/19 269 lb 8 oz (122.2 kg)  12/14/19 267 lb 6.4 oz (121.3 kg)    Physical Exam Vitals reviewed.  HENT:     Head: Normocephalic and atraumatic.  Eyes:     Pupils: Pupils are equal, round, and reactive to light.  Cardiovascular:     Rate and Rhythm: Normal rate and regular rhythm.     Heart sounds: Normal heart sounds.  Pulmonary:     Effort: Pulmonary effort is normal.     Breath sounds: Normal breath sounds.  Abdominal:     General: Bowel sounds are normal.     Palpations: Abdomen is soft.  Musculoskeletal:         General: No tenderness or deformity. Normal range of motion.     Cervical back: Normal range of motion.  Lymphadenopathy:     Cervical: No cervical adenopathy.  Skin:    General: Skin is warm and dry.     Findings: No erythema or rash.  Neurological:     Mental Status: He is alert and oriented to person, place, and time.  Psychiatric:        Behavior: Behavior normal.        Thought  Content: Thought content normal.        Judgment: Judgment normal.      Lab Results  Component Value Date   WBC 8.4 12/31/2019   HGB 9.3 (L) 12/31/2019   HCT 28.7 (L) 12/31/2019   MCV 107.9 (H) 12/31/2019   PLT 147 (L) 12/31/2019   Lab Results  Component Value Date   FERRITIN 2,848 (H) 12/21/2019   IRON 103 12/21/2019   TIBC 233 12/21/2019   UIBC 129 12/21/2019   IRONPCTSAT 44 12/21/2019   Lab Results  Component Value Date   RETICCTPCT 4.2 (H) 11/27/2019   RBC 2.66 (L) 12/31/2019   No results found for: KPAFRELGTCHN, LAMBDASER, KAPLAMBRATIO No results found for: IGGSERUM, IGA, IGMSERUM No results found for: [name], SPEI   Chemistry      Component Value Date/Time   NA 137 12/31/2019 0822   NA 140 12/18/2019 1411   NA 141 02/21/2017 0902   K 3.5 12/31/2019 0822   K 4.1 02/21/2017 0902   CL 102 12/31/2019 0822   CL 98 02/21/2017 0902   CO2 27 12/31/2019 0822   CO2 31 02/21/2017 0902   BUN 32 (H) 12/31/2019 0822   BUN 25 12/18/2019 1411   BUN 17 02/21/2017 0902   CREATININE 2.28 (H) 12/31/2019 0822   CREATININE 1.2 02/21/2017 0902      Component Value Date/Time   CALCIUM 8.8 (L) 12/31/2019 0822   CALCIUM 9.2 02/21/2017 0902   ALKPHOS 84 12/31/2019 0822   ALKPHOS 65 02/21/2017 0902   AST 21 12/31/2019 0822   ALT 19 12/31/2019 0822   ALT 18 02/21/2017 0902   BILITOT 0.5 12/31/2019 7829       Impression and Plan: [name] Rose is a very pleasant 69yo caucasian gentleman with metastatic high grade bladder  cancer.  He now has progressive disease.  Again he had done quite well with the ifosfamide/gemcitabine protocol.  The FDA recently approved Trodelvy for recurrent bladder cancer.  I think this would be very appropriate for [name] Rose.  I think toxicity would be reasonable.  I will certainly dose reduce a little bit because of his past treatment.  I we will go ahead with his treatment today.  I think he gets treatment today and next week.  He will get his Aranesp today.  Hopefully this will help with his anemia.  He does have the atrial fibrillation.  The rate seems to be fairly well controlled.  We will plan to get him back here in 3 weeks, or sooner if there are any problems.       Volanda Napoleon, MD 9/20/202110:04 AM

## 2019-12-31 NOTE — Patient Instructions (Signed)
[facility] Discharge Instructions for Patients Receiving Chemotherapy  Today you received the following chemotherapy agents Trodelvy and Aranesp  To help prevent nausea and vomiting after your treatment, we encourage you to take your nausea medication as prescribed by MD. **DO NOT TAKE ZOFRAN FOR 3 DAYS AFTER CHEMOTHERAPY**   If you develop nausea and vomiting that is not controlled by your nausea medication, call the clinic.   BELOW ARE SYMPTOMS THAT SHOULD BE REPORTED IMMEDIATELY:  *FEVER GREATER THAN 100.5 F  *CHILLS WITH OR WITHOUT FEVER  NAUSEA AND VOMITING THAT IS NOT CONTROLLED WITH YOUR NAUSEA MEDICATION  *UNUSUAL SHORTNESS OF BREATH  *UNUSUAL BRUISING OR BLEEDING  TENDERNESS IN MOUTH AND THROAT WITH OR WITHOUT PRESENCE OF ULCERS  *URINARY PROBLEMS  *BOWEL PROBLEMS  UNUSUAL RASH Items with * indicate a potential emergency and should be followed up as soon as possible.  Feel free to call the clinic should you have any questions or concerns. The clinic phone number is [phone].  Please show the [facility] at check-in to the Emergency Department and triage nurse.

## 2019-12-31 NOTE — Patient Instructions (Signed)

## 2020-01-01 ENCOUNTER — Other Ambulatory Visit (HOSPITAL_COMMUNITY): Payer: Self-pay | Admitting: Nurse Practitioner

## 2020-01-01 ENCOUNTER — Other Ambulatory Visit (HOSPITAL_COMMUNITY): Payer: Self-pay | Admitting: *Deleted

## 2020-01-01 ENCOUNTER — Inpatient Hospital Stay: Payer: PPO

## 2020-01-02 ENCOUNTER — Inpatient Hospital Stay: Payer: PPO

## 2020-01-07 ENCOUNTER — Inpatient Hospital Stay: Payer: PPO

## 2020-01-07 ENCOUNTER — Other Ambulatory Visit: Payer: Self-pay

## 2020-01-07 DIAGNOSIS — Z95828 Presence of other vascular implants and grafts: Secondary | ICD-10-CM

## 2020-01-07 DIAGNOSIS — Z5112 Encounter for antineoplastic immunotherapy: Secondary | ICD-10-CM | POA: Diagnosis not present

## 2020-01-07 DIAGNOSIS — C772 Secondary and unspecified malignant neoplasm of intra-abdominal lymph nodes: Secondary | ICD-10-CM

## 2020-01-07 DIAGNOSIS — C679 Malignant neoplasm of bladder, unspecified: Secondary | ICD-10-CM

## 2020-01-07 LAB — CMP (CANCER CENTER ONLY)
ALT: 20 U/L (ref 0–44)
AST: 17 U/L (ref 15–41)
Albumin: 3.7 g/dL (ref 3.5–5.0)
Alkaline Phosphatase: 81 U/L (ref 38–126)
Anion gap: 10 (ref 5–15)
BUN: 39 mg/dL — ABNORMAL HIGH (ref 8–23)
CO2: 25 mmol/L (ref 22–32)
Calcium: 8.9 mg/dL (ref 8.9–10.3)
Chloride: 100 mmol/L (ref 98–111)
Creatinine: 2.14 mg/dL — ABNORMAL HIGH (ref 0.61–1.24)
GFR, Est AFR Am: 36 mL/min — ABNORMAL LOW (ref >60)
GFR, Estimated: 31 mL/min — ABNORMAL LOW (ref >60)
Glucose, Bld: 224 mg/dL — ABNORMAL HIGH (ref 70–99)
Potassium: 3.7 mmol/L (ref 3.5–5.1)
Sodium: 135 mmol/L (ref 135–145)
Total Bilirubin: 0.7 mg/dL (ref 0.3–1.2)
Total Protein: 5.8 g/dL — ABNORMAL LOW (ref 6.5–8.1)

## 2020-01-07 LAB — CBC WITH DIFFERENTIAL (CANCER CENTER ONLY)
Abs Immature Granulocytes: 0.04 10*3/uL (ref 0.00–0.07)
Basophils Absolute: 0 10*3/uL (ref 0.0–0.1)
Basophils Relative: 0 %
Eosinophils Absolute: 0.3 10*3/uL (ref 0.0–0.5)
Eosinophils Relative: 3 %
HCT: 28.4 % — ABNORMAL LOW (ref 39.0–52.0)
Hemoglobin: 9.3 g/dL — ABNORMAL LOW (ref 13.0–17.0)
Immature Granulocytes: 1 %
Lymphocytes Relative: 12 %
Lymphs Abs: 1 10*3/uL (ref 0.7–4.0)
MCH: 34.6 pg — ABNORMAL HIGH (ref 26.0–34.0)
MCHC: 32.7 g/dL (ref 30.0–36.0)
MCV: 105.6 fL — ABNORMAL HIGH (ref 80.0–100.0)
Monocytes Absolute: 0.2 10*3/uL (ref 0.1–1.0)
Monocytes Relative: 2 %
Neutro Abs: 6.9 10*3/uL (ref 1.7–7.7)
Neutrophils Relative %: 82 %
Platelet Count: 71 10*3/uL — ABNORMAL LOW (ref 150–400)
RBC: 2.69 MIL/uL — ABNORMAL LOW (ref 4.22–5.81)
RDW: 14.6 % (ref 11.5–15.5)
WBC Count: 8.4 10*3/uL (ref 4.0–10.5)
nRBC: 0 % (ref 0.0–0.2)

## 2020-01-07 LAB — MAGNESIUM: Magnesium: 1.5 mg/dL — ABNORMAL LOW (ref 1.7–2.4)

## 2020-01-07 LAB — PHOSPHORUS: Phosphorus: 2.6 mg/dL (ref 2.5–4.6)

## 2020-01-07 MED ORDER — SODIUM CHLORIDE 0.9% FLUSH
10.0000 mL | Freq: Once | INTRAVENOUS | Status: AC
  Administered 2020-01-07: 10 mL via INTRAVENOUS
  Filled 2020-01-07: qty 10

## 2020-01-07 MED ORDER — HEPARIN SOD (PORK) LOCK FLUSH 100 UNIT/ML IV SOLN
500.0000 [IU] | Freq: Once | INTRAVENOUS | Status: AC
  Administered 2020-01-07: 500 [IU] via INTRAVENOUS
  Filled 2020-01-07: qty 5

## 2020-01-07 NOTE — Patient Instructions (Signed)

## 2020-01-07 NOTE — Progress Notes (Signed)
Reviewed pt labs with [name]; treatment to be held today secondary to platelets of 71. Pt to return to clinic 01/21/2020 per [name]. Reviewed signs and symptoms of thrombocytopenia with pt who verbalized understanding and had no further questions. Pt left clinic ambulatory in no apparent distress. Pt given a copy of labs.

## 2020-01-07 NOTE — Patient Instructions (Signed)
Thrombocytopenia Thrombocytopenia means that you have a low number of platelets in your blood. Platelets are tiny cells in the blood. When you bleed, they clump together at the cut or injury to stop the bleeding. This is called blood clotting. If you do not have enough platelets, it can cause bleeding problems. Some cases of this condition are mild while others are more severe. What are the causes? This condition may be caused by:  Your body not making enough platelets. This may be caused by: ? Your bone marrow not making blood cells (aplastic anemia). ? Cancer in the bone marrow. ? Certain medicines. ? Infection in the bone marrow. ? Drinking a lot of alcohol.  Your body destroying platelets too quickly. This may be caused by: ? Certain immune diseases. ? Certain medicines. ? Certain blood clotting disorders. ? Certain disorders that are passed from parent to child (inherited). ? Certain bleeding disorders. ? Pregnancy. ? Having a spleen that is larger than normal. What are the signs or symptoms?  Bleeding that is not normal.  Nosebleeds.  Heavy menstrual periods.  Blood in the pee (urine) or poop (stool).  A purple-like color to the skin (purpura).  Bruising.  A rash that looks like pinpoint, purple-red spots (petechiae). How is this treated?  Treatment of another condition that is causing the low platelet count.  Medicines to help protect your platelets from being destroyed.  A replacement (transfusion) of platelets to stop or prevent bleeding.  Surgery to remove the spleen. Follow these instructions at home: Activity  Avoid activities that could cause you to get hurt or bruised. Follow instructions about how to prevent falls.  Take care not to cut yourself: ? When you shave. ? When you use scissors, needles, knives, or other tools.  Take care not to burn yourself: ? When you use an iron. ? When you cook. General instructions   Check your skin and the  inside of your mouth for bruises or blood as told by your doctor.  Check to see if there is blood in your spit (sputum), pee, and poop. Do this as told by your doctor.  Do not drink alcohol.  Take over-the-counter and prescription medicines only as told by your doctor.  Do not take any medicines that have aspirin or NSAIDs in them. These medicines can thin your blood and cause you to bleed.  Tell all of your doctors that you have this condition. Be sure to tell your dentist and eye doctor too. Contact a doctor if:  You have bruises and you do not know why. Get help right away if:  You are bleeding anywhere on your body.  You have blood in your spit, pee, or poop. Summary  Thrombocytopenia means that you have a low number of platelets in your blood.  Platelets are needed for blood clotting.  Symptoms of this condition include bleeding that is not normal, and bruising.  Take care not to cut or burn yourself. This information is not intended to replace advice given to you by your health care provider. Make sure you discuss any questions you have with your health care provider. Document Revised: 12/29/2017 Document Reviewed: 12/29/2017 Elsevier Patient Education  2020 [employer]  

## 2020-01-14 DIAGNOSIS — C678 Malignant neoplasm of overlapping sites of bladder: Secondary | ICD-10-CM | POA: Diagnosis not present

## 2020-01-17 ENCOUNTER — Other Ambulatory Visit (HOSPITAL_COMMUNITY): Payer: Self-pay | Admitting: Nurse Practitioner

## 2020-01-21 ENCOUNTER — Other Ambulatory Visit: Payer: Self-pay | Admitting: Hematology & Oncology

## 2020-01-21 ENCOUNTER — Other Ambulatory Visit: Payer: Self-pay

## 2020-01-21 ENCOUNTER — Encounter: Payer: Self-pay | Admitting: Hematology & Oncology

## 2020-01-21 ENCOUNTER — Inpatient Hospital Stay: Payer: PPO

## 2020-01-21 ENCOUNTER — Inpatient Hospital Stay: Payer: PPO | Attending: Hematology & Oncology | Admitting: Hematology & Oncology

## 2020-01-21 VITALS — BP 132/98 | HR 91 | Temp 99.1°F | Resp 17 | Wt 271.0 lb

## 2020-01-21 DIAGNOSIS — Z79899 Other long term (current) drug therapy: Secondary | ICD-10-CM | POA: Diagnosis not present

## 2020-01-21 DIAGNOSIS — Z5112 Encounter for antineoplastic immunotherapy: Secondary | ICD-10-CM | POA: Diagnosis not present

## 2020-01-21 DIAGNOSIS — G629 Polyneuropathy, unspecified: Secondary | ICD-10-CM | POA: Diagnosis not present

## 2020-01-21 DIAGNOSIS — C679 Malignant neoplasm of bladder, unspecified: Secondary | ICD-10-CM

## 2020-01-21 DIAGNOSIS — D631 Anemia in chronic kidney disease: Secondary | ICD-10-CM | POA: Diagnosis not present

## 2020-01-21 DIAGNOSIS — C772 Secondary and unspecified malignant neoplasm of intra-abdominal lymph nodes: Secondary | ICD-10-CM

## 2020-01-21 DIAGNOSIS — C7889 Secondary malignant neoplasm of other digestive organs: Secondary | ICD-10-CM | POA: Insufficient documentation

## 2020-01-21 DIAGNOSIS — N289 Disorder of kidney and ureter, unspecified: Secondary | ICD-10-CM | POA: Diagnosis not present

## 2020-01-21 DIAGNOSIS — I4891 Unspecified atrial fibrillation: Secondary | ICD-10-CM | POA: Insufficient documentation

## 2020-01-21 DIAGNOSIS — D500 Iron deficiency anemia secondary to blood loss (chronic): Secondary | ICD-10-CM

## 2020-01-21 LAB — CMP (CANCER CENTER ONLY)
ALT: 21 U/L (ref 0–44)
AST: 23 U/L (ref 15–41)
Albumin: 3.7 g/dL (ref 3.5–5.0)
Alkaline Phosphatase: 107 U/L (ref 38–126)
Anion gap: 7 (ref 5–15)
BUN: 27 mg/dL — ABNORMAL HIGH (ref 8–23)
CO2: 29 mmol/L (ref 22–32)
Calcium: 9.3 mg/dL (ref 8.9–10.3)
Chloride: 100 mmol/L (ref 98–111)
Creatinine: 2.49 mg/dL — ABNORMAL HIGH (ref 0.61–1.24)
GFR, Estimated: 26 mL/min — ABNORMAL LOW (ref >60)
Glucose, Bld: 226 mg/dL — ABNORMAL HIGH (ref 70–99)
Potassium: 3.8 mmol/L (ref 3.5–5.1)
Sodium: 136 mmol/L (ref 135–145)
Total Bilirubin: 0.4 mg/dL (ref 0.3–1.2)
Total Protein: 6 g/dL — ABNORMAL LOW (ref 6.5–8.1)

## 2020-01-21 LAB — CBC WITH DIFFERENTIAL (CANCER CENTER ONLY)
Abs Immature Granulocytes: 0.04 10*3/uL (ref 0.00–0.07)
Basophils Absolute: 0 10*3/uL (ref 0.0–0.1)
Basophils Relative: 1 %
Eosinophils Absolute: 0.2 10*3/uL (ref 0.0–0.5)
Eosinophils Relative: 2 %
HCT: 32 % — ABNORMAL LOW (ref 39.0–52.0)
Hemoglobin: 10.6 g/dL — ABNORMAL LOW (ref 13.0–17.0)
Immature Granulocytes: 1 %
Lymphocytes Relative: 18 %
Lymphs Abs: 1.2 10*3/uL (ref 0.7–4.0)
MCH: 34.8 pg — ABNORMAL HIGH (ref 26.0–34.0)
MCHC: 33.1 g/dL (ref 30.0–36.0)
MCV: 104.9 fL — ABNORMAL HIGH (ref 80.0–100.0)
Monocytes Absolute: 0.8 10*3/uL (ref 0.1–1.0)
Monocytes Relative: 12 %
Neutro Abs: 4.4 10*3/uL (ref 1.7–7.7)
Neutrophils Relative %: 66 %
Platelet Count: 147 10*3/uL — ABNORMAL LOW (ref 150–400)
RBC: 3.05 MIL/uL — ABNORMAL LOW (ref 4.22–5.81)
RDW: 14.7 % (ref 11.5–15.5)
WBC Count: 6.6 10*3/uL (ref 4.0–10.5)
nRBC: 0 % (ref 0.0–0.2)

## 2020-01-21 LAB — IRON AND TIBC
Iron: 80 ug/dL (ref 42–163)
Saturation Ratios: 35 % (ref 20–55)
TIBC: 229 ug/dL (ref 202–409)
UIBC: 148 ug/dL (ref 117–376)

## 2020-01-21 LAB — SAMPLE TO BLOOD BANK

## 2020-01-21 LAB — RETICULOCYTES
Immature Retic Fract: 29 % — ABNORMAL HIGH (ref 2.3–15.9)
RBC.: 3.09 MIL/uL — ABNORMAL LOW (ref 4.22–5.81)
Retic Count, Absolute: 127.3 10*3/uL (ref 19.0–186.0)
Retic Ct Pct: 4.1 % — ABNORMAL HIGH (ref 0.4–3.1)

## 2020-01-21 LAB — LACTATE DEHYDROGENASE: LDH: 188 U/L (ref 98–192)

## 2020-01-21 LAB — FERRITIN: Ferritin: 2086 ng/mL — ABNORMAL HIGH (ref 24–336)

## 2020-01-21 MED ORDER — SODIUM CHLORIDE 0.9 % IV SOLN
150.0000 mg | Freq: Once | INTRAVENOUS | Status: AC
  Administered 2020-01-21: 150 mg via INTRAVENOUS
  Filled 2020-01-21: qty 150

## 2020-01-21 MED ORDER — FAMOTIDINE IN NACL 20-0.9 MG/50ML-% IV SOLN
INTRAVENOUS | Status: AC
  Filled 2020-01-21: qty 50

## 2020-01-21 MED ORDER — ACETAMINOPHEN 325 MG PO TABS
ORAL_TABLET | ORAL | Status: AC
  Filled 2020-01-21: qty 2

## 2020-01-21 MED ORDER — SODIUM CHLORIDE 0.9 % IV SOLN
7.2000 mg/kg | Freq: Once | INTRAVENOUS | Status: AC
  Administered 2020-01-21: 900 mg via INTRAVENOUS
  Filled 2020-01-21: qty 90

## 2020-01-21 MED ORDER — DIPHENHYDRAMINE HCL 50 MG/ML IJ SOLN
INTRAMUSCULAR | Status: AC
  Filled 2020-01-21: qty 1

## 2020-01-21 MED ORDER — ACETAMINOPHEN 325 MG PO TABS
650.0000 mg | ORAL_TABLET | Freq: Once | ORAL | Status: AC
  Administered 2020-01-21: 650 mg via ORAL

## 2020-01-21 MED ORDER — PALONOSETRON HCL INJECTION 0.25 MG/5ML
0.2500 mg | Freq: Once | INTRAVENOUS | Status: AC
  Administered 2020-01-21: 0.25 mg via INTRAVENOUS

## 2020-01-21 MED ORDER — DIPHENHYDRAMINE HCL 50 MG/ML IJ SOLN
50.0000 mg | Freq: Once | INTRAMUSCULAR | Status: AC
  Administered 2020-01-21: 50 mg via INTRAVENOUS

## 2020-01-21 MED ORDER — ATROPINE SULFATE 1 MG/ML IJ SOLN: 0.5000 mg | Freq: Once | INTRAMUSCULAR | Status: DC | PRN

## 2020-01-21 MED ORDER — SODIUM CHLORIDE 0.9 % IV SOLN
Freq: Once | INTRAVENOUS | Status: AC
  Filled 2020-01-21: qty 250

## 2020-01-21 MED ORDER — SODIUM CHLORIDE 0.9 % IV SOLN
10.0000 mg | Freq: Once | INTRAVENOUS | Status: AC
  Administered 2020-01-21: 10 mg via INTRAVENOUS
  Filled 2020-01-21: qty 10

## 2020-01-21 MED ORDER — HEPARIN SOD (PORK) LOCK FLUSH 100 UNIT/ML IV SOLN
500.0000 [IU] | Freq: Once | INTRAVENOUS | Status: AC | PRN
  Administered 2020-01-21: 500 [IU]
  Filled 2020-01-21: qty 5

## 2020-01-21 MED ORDER — PALONOSETRON HCL INJECTION 0.25 MG/5ML
INTRAVENOUS | Status: AC
  Filled 2020-01-21: qty 5

## 2020-01-21 MED ORDER — FAMOTIDINE IN NACL 20-0.9 MG/50ML-% IV SOLN
20.0000 mg | Freq: Once | INTRAVENOUS | Status: AC
  Administered 2020-01-21: 20 mg via INTRAVENOUS

## 2020-01-21 NOTE — Progress Notes (Signed)
Okay to treat per [name]

## 2020-01-21 NOTE — Progress Notes (Signed)
Hematology and Oncology Follow Up Visit  [name] Rose [id number] 02/22/1951 69 y.o. 01/21/2020   Principle Diagnosis:  Metastatic high-grade bladder cancer - recurrent Anemia of renal insufficiency Diverticular abscess - E.coli  Past Therapy: Atezolizumab 1200mg  IV q 3 wks - s/p cycle #4 - d/c due to progression. Taxotere 80mg /m2 IV q 3 wks - s/p cycle #3 - d/c due to progression M-VAC - s/p cycle#8 -- d/c on 07/10/2018 due to blood counts Padcev -- s/p cycle #1 on 04/06/2020d/c 08/28/2018  Current Therapy: Trodelvy8 mg/kg IV d1&8 -- start cycle #2 on 12/31/2019 Aranesp 300 mcg sq q 3 week for Hgb < 11 Ifosfamide/Gemzar -- s/p cycle #9- started 04/16/2019  -- d/c on 12/21/2019 due to progression IV iron as indicated   Interim History:  Mr. Werts is here today for follow-up.  He is looking much better.  He feels a little bit stronger.  The last time I saw him, his platelet count was down a little bit too low for [place] to treat him.  He does have the atrial fibrillation.  He has been doing fairly well with this.  It has not caused any flareups or exacerbations of fatigue.  There is been no problems with bleeding.  He has had some loose stool.  He takes over-the-counter treatment for this.  He has had no problems with cough.  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  This is about the same for him.  He has had no bleeding.  He has had no fever.  He has had no headache.  Overall, his performance status is ECOG 1.    Medications:  Allergies as of 01/21/2020   No Known Allergies     Medication List       Accurate as of January 21, 2020  9:50 AM. If you have any questions, ask your nurse or doctor.        acetaminophen 500 MG tablet Commonly known as: TYLENOL Take 1,000 mg by mouth every 6 (six) hours as needed for mild pain or headache.   amoxicillin 500 MG capsule Commonly known as: AMOXIL TAKE 4 CAPS (2,000 MG TOTAL) BY MOUTH ONCE FOR 1 DOSE. TAKE 1  HOUR PRIOR TO DENTAL PROCEDURE   apixaban 5 MG Tabs tablet Commonly known as: Eliquis Take 1 tablet (5 mg total) by mouth 2 (two) times daily.   atorvastatin 40 MG tablet Commonly known as: LIPITOR Take 40 mg by mouth every evening.   cyclobenzaprine 7.5 MG tablet Commonly known as: FEXMID Take 1 tablet (7.5 mg total) by mouth 3 (three) times daily as needed for muscle spasms.   dexamethasone 4 MG tablet Commonly known as: DECADRON Take 2 tablets (8 mg) daily for 3 days after chemotherapy. Take with food.   diltiazem 360 MG 24 hr capsule Commonly known as: CARDIZEM CD TAKE 1 CAPSULE BY MOUTH EVERY DAY What changed: how much to take   dofetilide 250 MCG capsule Commonly known as: TIKOSYN Take 1 capsule (250 mcg total) by mouth 2 (two) times daily.   DULoxetine 60 MG capsule Commonly known as: CYMBALTA TAKE ONE CAPSULE BY MOUTH DAILY   gabapentin 400 MG capsule Commonly known as: NEURONTIN TAKE 1 CAPSULE (400 MG TOTAL) BY MOUTH 4 (FOUR) TIMES DAILY. What changed:   how much to take  when to take this   K-Phos-Neutral [id number] MG Tabs Take 1 tablet by mouth 2 (two) times daily.   lidocaine-prilocaine cream Commonly known as: EMLA Apply to affected area  once   loperamide 2 MG tablet Commonly known as: IMODIUM A-D Take 2 at diarrhea onset, then 1 every 2hr until 12hrs with no BM. May take 2 every 4hrs at night. If diarrhea recurs repeat.   LORazepam 0.5 MG tablet Commonly known as: Ativan Take 1 tablet (0.5 mg total) by mouth every 6 (six) hours as needed (Nausea or vomiting).   magnesium oxide 400 (241.3 Mg) MG tablet Commonly known as: MAG-OX TAKE ONE TABLET BY MOUTH TWICE A DAY   magnesium oxide 400 MG tablet Commonly known as: MAG-OX Take 1 tablet (400 mg total) by mouth 2 (two) times daily.   metoprolol succinate 50 MG 24 hr tablet Commonly known as: TOPROL-XL TAKE ONE TABLET BY MOUTH EVERY MORNING AND TAKE ONE TABLET BY MOUTH EVERY NIGHT AT  BEDTIME   ondansetron 8 MG tablet Commonly known as: Zofran Take 1 tablet (8 mg total) by mouth 2 (two) times daily as needed. Start on the third day after chemotherapy.   oxybutynin 10 MG 24 hr tablet Commonly known as: DITROPAN-XL Take 10 mg by mouth at bedtime.   potassium chloride 10 MEQ tablet Commonly known as: KLOR-CON Take 2 tablets (20 mEq total) by mouth daily.   prochlorperazine 10 MG tablet Commonly known as: COMPAZINE Take 1 tablet (10 mg total) by mouth every 6 (six) hours as needed (Nausea or vomiting).   pyridoxine 100 MG tablet Commonly known as: B-6 Take 200 mg by mouth daily with breakfast.   traMADol 50 MG tablet Commonly known as: ULTRAM Take 50 mg by mouth daily as needed (for pain).   VITAMIN D-3 PO Take 1 capsule by mouth daily with breakfast.       Allergies: No Known Allergies  Past Medical History, Surgical history, Social history, and Family History were reviewed and updated.  Review of Systems: Review of Systems  Constitutional: Negative.   HENT: Negative.   Eyes: Negative.   Respiratory: Negative.   Cardiovascular: Positive for palpitations.  Gastrointestinal: Positive for nausea.  Genitourinary: Negative.   Musculoskeletal: Positive for myalgias.  Skin: Negative.   Neurological: Positive for tingling.  Endo/Heme/Allergies: Negative.   Psychiatric/Behavioral: Negative.      Physical Exam:  weight is 271 lb (122.9 kg). His oral temperature is 99.1 F (37.3 C). His blood pressure is 132/98 (abnormal) and his pulse is 91. His respiration is 17 and oxygen saturation is 95%.   Wt Readings from Last 3 Encounters:  01/21/20 271 lb (122.9 kg)  12/31/19 275 lb 8 oz (125 kg)  12/21/19 269 lb 8 oz (122.2 kg)    Physical Exam Vitals reviewed.  HENT:     Head: Normocephalic and atraumatic.  Eyes:     Pupils: Pupils are equal, round, and reactive to light.  Cardiovascular:     Rate and Rhythm: Normal rate and regular rhythm.      Heart sounds: Normal heart sounds.  Pulmonary:     Effort: Pulmonary effort is normal.     Breath sounds: Normal breath sounds.  Abdominal:     General: Bowel sounds are normal.     Palpations: Abdomen is soft.  Musculoskeletal:        General: No tenderness or deformity. Normal range of motion.     Cervical back: Normal range of motion.  Lymphadenopathy:     Cervical: No cervical adenopathy.  Skin:    General: Skin is warm and dry.     Findings: No erythema or rash.  Neurological:  Mental Status: He is alert and oriented to person, place, and time.  Psychiatric:        Behavior: Behavior normal.        Thought Content: Thought content normal.        Judgment: Judgment normal.      Lab Results  Component Value Date   WBC 6.6 01/21/2020   HGB 10.6 (L) 01/21/2020   HCT 32.0 (L) 01/21/2020   MCV 104.9 (H) 01/21/2020   PLT 147 (L) 01/21/2020   Lab Results  Component Value Date   FERRITIN 2,201 (H) 12/31/2019   IRON 104 12/31/2019   TIBC 223 12/31/2019   UIBC 118 12/31/2019   IRONPCTSAT 47 12/31/2019   Lab Results  Component Value Date   RETICCTPCT 4.1 (H) 01/21/2020   RBC 3.05 (L) 01/21/2020   RBC 3.09 (L) 01/21/2020   No results found for: KPAFRELGTCHN, LAMBDASER, KAPLAMBRATIO No results found for: IGGSERUM, IGA, IGMSERUM No results found for: [name], SPEI   Chemistry      Component Value Date/Time   NA 136 01/21/2020 0910   NA 140 12/18/2019 1411   NA 141 02/21/2017 0902   K 3.8 01/21/2020 0910   K 4.1 02/21/2017 0902   CL 100 01/21/2020 0910   CL 98 02/21/2017 0902   CO2 29 01/21/2020 0910   CO2 31 02/21/2017 0902   BUN 27 (H) 01/21/2020 0910   BUN 25 12/18/2019 1411   BUN 17 02/21/2017 0902   CREATININE 2.49 (H) 01/21/2020 0910   CREATININE 1.2 02/21/2017 0902      Component Value Date/Time   CALCIUM 9.3 01/21/2020 0910   CALCIUM 9.2 02/21/2017 0902   ALKPHOS 107 01/21/2020 0910    ALKPHOS 65 02/21/2017 0902   AST 23 01/21/2020 0910   ALT 21 01/21/2020 0910   ALT 18 02/21/2017 0902   BILITOT 0.4 01/21/2020 0910       Impression and Plan: Mr. Koppel is a very pleasant 69yo caucasian gentleman with metastatic high grade bladder cancer.  He is now on [place].  I am going to make a dosage adjustment to the [place].  I am also going to give him an extra week off after day #8.  Hopefully this will help with his blood counts.  We will proceed with his third cycle of treatment.  After the fourth cycle, we will then rescan him.  He is on Aranesp.  We will hold off on the Aranesp today.   Volanda Napoleon, MD 10/11/20219:50 AM

## 2020-01-21 NOTE — Patient Instructions (Signed)

## 2020-01-21 NOTE — Patient Instructions (Signed)
Thrombocytopenia Thrombocytopenia means that you have a low number of platelets in your blood. Platelets are tiny cells in the blood. When you bleed, they clump together at the cut or injury to stop the bleeding. This is called blood clotting. If you do not have enough platelets, it can cause bleeding problems. Some cases of this condition are mild while others are more severe. What are the causes? This condition may be caused by:  Your body not making enough platelets. This may be caused by: ? Your bone marrow not making blood cells (aplastic anemia). ? Cancer in the bone marrow. ? Certain medicines. ? Infection in the bone marrow. ? Drinking a lot of alcohol.  Your body destroying platelets too quickly. This may be caused by: ? Certain immune diseases. ? Certain medicines. ? Certain blood clotting disorders. ? Certain disorders that are passed from parent to child (inherited). ? Certain bleeding disorders. ? Pregnancy. ? Having a spleen that is larger than normal. What are the signs or symptoms?  Bleeding that is not normal.  Nosebleeds.  Heavy menstrual periods.  Blood in the pee (urine) or poop (stool).  A purple-like color to the skin (purpura).  Bruising.  A rash that looks like pinpoint, purple-red spots (petechiae). How is this treated?  Treatment of another condition that is causing the low platelet count.  Medicines to help protect your platelets from being destroyed.  A replacement (transfusion) of platelets to stop or prevent bleeding.  Surgery to remove the spleen. Follow these instructions at home: Activity  Avoid activities that could cause you to get hurt or bruised. Follow instructions about how to prevent falls.  Take care not to cut yourself: ? When you shave. ? When you use scissors, needles, knives, or other tools.  Take care not to burn yourself: ? When you use an iron. ? When you cook. General instructions   Check your skin and the  inside of your mouth for bruises or blood as told by your doctor.  Check to see if there is blood in your spit (sputum), pee, and poop. Do this as told by your doctor.  Do not drink alcohol.  Take over-the-counter and prescription medicines only as told by your doctor.  Do not take any medicines that have aspirin or NSAIDs in them. These medicines can thin your blood and cause you to bleed.  Tell all of your doctors that you have this condition. Be sure to tell your dentist and eye doctor too. Contact a doctor if:  You have bruises and you do not know why. Get help right away if:  You are bleeding anywhere on your body.  You have blood in your spit, pee, or poop. Summary  Thrombocytopenia means that you have a low number of platelets in your blood.  Platelets are needed for blood clotting.  Symptoms of this condition include bleeding that is not normal, and bruising.  Take care not to cut or burn yourself. This information is not intended to replace advice given to you by your health care provider. Make sure you discuss any questions you have with your health care provider. Document Revised: 12/29/2017 Document Reviewed: 12/29/2017 Elsevier Patient Education  2020 [employer]  

## 2020-01-23 ENCOUNTER — Telehealth: Payer: Self-pay | Admitting: Physician Assistant

## 2020-01-23 NOTE — Telephone Encounter (Signed)
Opened in error

## 2020-01-28 ENCOUNTER — Inpatient Hospital Stay: Payer: PPO

## 2020-01-28 ENCOUNTER — Ambulatory Visit (HOSPITAL_COMMUNITY)
Admission: RE | Admit: 2020-01-28 | Discharge: 2020-01-28 | Disposition: A | Discharge status: Home / Self Care | Payer: PPO | Source: Ambulatory Visit | Attending: Nurse Practitioner | Admitting: Nurse Practitioner

## 2020-01-28 ENCOUNTER — Encounter (HOSPITAL_COMMUNITY): Payer: Self-pay | Admitting: Nurse Practitioner

## 2020-01-28 ENCOUNTER — Other Ambulatory Visit: Payer: Self-pay

## 2020-01-28 VITALS — BP 118/70 | HR 80 | Ht 74.0 in | Wt 270.6 lb

## 2020-01-28 VITALS — BP 98/56 | HR 50 | Temp 98.3°F | Resp 18

## 2020-01-28 DIAGNOSIS — Z8551 Personal history of malignant neoplasm of bladder: Secondary | ICD-10-CM | POA: Diagnosis not present

## 2020-01-28 DIAGNOSIS — Z5112 Encounter for antineoplastic immunotherapy: Secondary | ICD-10-CM | POA: Diagnosis not present

## 2020-01-28 DIAGNOSIS — Z87891 Personal history of nicotine dependence: Secondary | ICD-10-CM | POA: Insufficient documentation

## 2020-01-28 DIAGNOSIS — C772 Secondary and unspecified malignant neoplasm of intra-abdominal lymph nodes: Secondary | ICD-10-CM

## 2020-01-28 DIAGNOSIS — I129 Hypertensive chronic kidney disease with stage 1 through stage 4 chronic kidney disease, or unspecified chronic kidney disease: Secondary | ICD-10-CM | POA: Insufficient documentation

## 2020-01-28 DIAGNOSIS — Z79899 Other long term (current) drug therapy: Secondary | ICD-10-CM | POA: Insufficient documentation

## 2020-01-28 DIAGNOSIS — I440 Atrioventricular block, first degree: Secondary | ICD-10-CM | POA: Diagnosis not present

## 2020-01-28 DIAGNOSIS — Z87898 Personal history of other specified conditions: Secondary | ICD-10-CM | POA: Insufficient documentation

## 2020-01-28 DIAGNOSIS — N183 Chronic kidney disease, stage 3 unspecified: Secondary | ICD-10-CM | POA: Insufficient documentation

## 2020-01-28 DIAGNOSIS — Z7901 Long term (current) use of anticoagulants: Secondary | ICD-10-CM | POA: Insufficient documentation

## 2020-01-28 DIAGNOSIS — D631 Anemia in chronic kidney disease: Secondary | ICD-10-CM | POA: Insufficient documentation

## 2020-01-28 DIAGNOSIS — E1122 Type 2 diabetes mellitus with diabetic chronic kidney disease: Secondary | ICD-10-CM | POA: Diagnosis not present

## 2020-01-28 DIAGNOSIS — D508 Other iron deficiency anemias: Secondary | ICD-10-CM

## 2020-01-28 DIAGNOSIS — C679 Malignant neoplasm of bladder, unspecified: Secondary | ICD-10-CM

## 2020-01-28 DIAGNOSIS — N189 Chronic kidney disease, unspecified: Secondary | ICD-10-CM | POA: Diagnosis not present

## 2020-01-28 DIAGNOSIS — Z992 Dependence on renal dialysis: Secondary | ICD-10-CM | POA: Insufficient documentation

## 2020-01-28 DIAGNOSIS — Z8546 Personal history of malignant neoplasm of prostate: Secondary | ICD-10-CM | POA: Insufficient documentation

## 2020-01-28 DIAGNOSIS — I493 Ventricular premature depolarization: Secondary | ICD-10-CM | POA: Insufficient documentation

## 2020-01-28 DIAGNOSIS — D6869 Other thrombophilia: Secondary | ICD-10-CM | POA: Diagnosis not present

## 2020-01-28 DIAGNOSIS — I4819 Other persistent atrial fibrillation: Secondary | ICD-10-CM | POA: Insufficient documentation

## 2020-01-28 LAB — CMP (CANCER CENTER ONLY)
ALT: 25 U/L (ref 0–44)
AST: 19 U/L (ref 15–41)
Albumin: 3.9 g/dL (ref 3.5–5.0)
Alkaline Phosphatase: 107 U/L (ref 38–126)
Anion gap: 11 (ref 5–15)
BUN: 37 mg/dL — ABNORMAL HIGH (ref 8–23)
CO2: 24 mmol/L (ref 22–32)
Calcium: 9.4 mg/dL (ref 8.9–10.3)
Chloride: 101 mmol/L (ref 98–111)
Creatinine: 2.46 mg/dL — ABNORMAL HIGH (ref 0.61–1.24)
GFR, Estimated: 26 mL/min — ABNORMAL LOW (ref >60)
Glucose, Bld: 138 mg/dL — ABNORMAL HIGH (ref 70–99)
Potassium: 3.8 mmol/L (ref 3.5–5.1)
Sodium: 136 mmol/L (ref 135–145)
Total Bilirubin: 0.5 mg/dL (ref 0.3–1.2)
Total Protein: 6.5 g/dL (ref 6.5–8.1)

## 2020-01-28 LAB — CBC WITH DIFFERENTIAL (CANCER CENTER ONLY)
Abs Immature Granulocytes: 0.05 10*3/uL (ref 0.00–0.07)
Basophils Absolute: 0 10*3/uL (ref 0.0–0.1)
Basophils Relative: 0 %
Eosinophils Absolute: 0.1 10*3/uL (ref 0.0–0.5)
Eosinophils Relative: 2 %
HCT: 32.1 % — ABNORMAL LOW (ref 39.0–52.0)
Hemoglobin: 10.8 g/dL — ABNORMAL LOW (ref 13.0–17.0)
Immature Granulocytes: 1 %
Lymphocytes Relative: 23 %
Lymphs Abs: 1.2 10*3/uL (ref 0.7–4.0)
MCH: 34.8 pg — ABNORMAL HIGH (ref 26.0–34.0)
MCHC: 33.6 g/dL (ref 30.0–36.0)
MCV: 103.5 fL — ABNORMAL HIGH (ref 80.0–100.0)
Monocytes Absolute: 0.3 10*3/uL (ref 0.1–1.0)
Monocytes Relative: 6 %
Neutro Abs: 3.8 10*3/uL (ref 1.7–7.7)
Neutrophils Relative %: 68 %
Platelet Count: 85 10*3/uL — ABNORMAL LOW (ref 150–400)
RBC: 3.1 MIL/uL — ABNORMAL LOW (ref 4.22–5.81)
RDW: 14.1 % (ref 11.5–15.5)
WBC Count: 5.5 10*3/uL (ref 4.0–10.5)
nRBC: 0 % (ref 0.0–0.2)

## 2020-01-28 LAB — BASIC METABOLIC PANEL
Anion gap: 9 (ref 5–15)
BUN: 34 mg/dL — ABNORMAL HIGH (ref 8–23)
CO2: 28 mmol/L (ref 22–32)
Calcium: 9.3 mg/dL (ref 8.9–10.3)
Chloride: 105 mmol/L (ref 98–111)
Creatinine, Ser: 2.53 mg/dL — ABNORMAL HIGH (ref 0.61–1.24)
GFR, Estimated: 25 mL/min — ABNORMAL LOW (ref >60)
Glucose, Bld: 196 mg/dL — ABNORMAL HIGH (ref 70–99)
Potassium: 4.6 mmol/L (ref 3.5–5.1)
Sodium: 142 mmol/L (ref 135–145)

## 2020-01-28 LAB — MAGNESIUM
Magnesium: 1.8 mg/dL (ref 1.7–2.4)
Magnesium: 1.6 mg/dL — ABNORMAL LOW (ref 1.7–2.4)

## 2020-01-28 LAB — PHOSPHORUS: Phosphorus: 2.9 mg/dL (ref 2.5–4.6)

## 2020-01-28 MED ORDER — SODIUM CHLORIDE 0.9 % IV SOLN
150.0000 mg | Freq: Once | INTRAVENOUS | Status: AC
  Administered 2020-01-28: 150 mg via INTRAVENOUS
  Filled 2020-01-28: qty 150

## 2020-01-28 MED ORDER — DARBEPOETIN ALFA 300 MCG/0.6ML IJ SOSY: 300.0000 ug | PREFILLED_SYRINGE | Freq: Once | INTRAMUSCULAR | Status: DC

## 2020-01-28 MED ORDER — ACETAMINOPHEN 325 MG PO TABS
650.0000 mg | ORAL_TABLET | Freq: Once | ORAL | Status: AC
  Administered 2020-01-28: 650 mg via ORAL

## 2020-01-28 MED ORDER — DIPHENHYDRAMINE HCL 50 MG/ML IJ SOLN
50.0000 mg | Freq: Once | INTRAMUSCULAR | Status: AC
  Administered 2020-01-28: 50 mg via INTRAVENOUS

## 2020-01-28 MED ORDER — DIPHENHYDRAMINE HCL 50 MG/ML IJ SOLN
INTRAMUSCULAR | Status: AC
  Filled 2020-01-28: qty 1

## 2020-01-28 MED ORDER — FAMOTIDINE IN NACL 20-0.9 MG/50ML-% IV SOLN
20.0000 mg | Freq: Once | INTRAVENOUS | Status: AC
  Administered 2020-01-28: 20 mg via INTRAVENOUS

## 2020-01-28 MED ORDER — HEPARIN SOD (PORK) LOCK FLUSH 100 UNIT/ML IV SOLN
500.0000 [IU] | Freq: Once | INTRAVENOUS | Status: DC | PRN
  Filled 2020-01-28: qty 5

## 2020-01-28 MED ORDER — PALONOSETRON HCL INJECTION 0.25 MG/5ML
INTRAVENOUS | Status: AC
  Filled 2020-01-28: qty 5

## 2020-01-28 MED ORDER — SODIUM CHLORIDE 0.9 % IV SOLN
7.2000 mg/kg | Freq: Once | INTRAVENOUS | Status: AC
  Administered 2020-01-28: 900 mg via INTRAVENOUS
  Filled 2020-01-28: qty 90

## 2020-01-28 MED ORDER — SODIUM CHLORIDE 0.9% FLUSH
10.0000 mL | INTRAVENOUS | Status: DC | PRN
  Filled 2020-01-28: qty 10

## 2020-01-28 MED ORDER — FAMOTIDINE IN NACL 20-0.9 MG/50ML-% IV SOLN
INTRAVENOUS | Status: AC
  Filled 2020-01-28: qty 50

## 2020-01-28 MED ORDER — DARBEPOETIN ALFA 300 MCG/0.6ML IJ SOSY
PREFILLED_SYRINGE | INTRAMUSCULAR | Status: AC
  Filled 2020-01-28: qty 0.6

## 2020-01-28 MED ORDER — SODIUM CHLORIDE 0.9 % IV SOLN
10.0000 mg | Freq: Once | INTRAVENOUS | Status: AC
  Administered 2020-01-28: 10 mg via INTRAVENOUS
  Filled 2020-01-28: qty 10

## 2020-01-28 MED ORDER — ACETAMINOPHEN 325 MG PO TABS
ORAL_TABLET | ORAL | Status: AC
  Filled 2020-01-28: qty 2

## 2020-01-28 MED ORDER — SODIUM CHLORIDE 0.9 % IV SOLN
Freq: Once | INTRAVENOUS | Status: AC
  Filled 2020-01-28: qty 250

## 2020-01-28 MED ORDER — PALONOSETRON HCL INJECTION 0.25 MG/5ML
0.2500 mg | Freq: Once | INTRAVENOUS | Status: AC
  Administered 2020-01-28: 0.25 mg via INTRAVENOUS

## 2020-01-28 NOTE — Progress Notes (Addendum)
Primary Care Physician: Shirline Frees, MD Referring Physician: [name] Cardiologist: [name]  Oncologist : [name] Urologist: [name] is a 69 y.o. 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 as pt had recent cardioversion with ERAF. In his note he suggested use of flecainide and recent stress test was low risk on review of [name].   The pt does c/o of a lot of fatigue but has other issues as well contributing. He is rate controlled. He is on eliquis 5 mg bid for a CHA2DS2VASc Score of 2. Heis pending a bladder resection on 4/26. His last creatinine was 2.11.  Pt had pending resection of bladder pending 4/26. His BB was increased prior to surgery to have better rate control. It was not an appropriate time to try to restore SR as I am sure chemical or electrical cardioversion would have been needed and that meant he would have to delay surgery as he would not be able to come off anticoagulation x 4 weeks. He would not have been able to use flecainide as it interfered with 4 of his medicines.   He returns today, 08/15/19 as physical therapy found his HR increased too much with activity and he  paused therapy. He is rate controlled today at 92 mbp. His BP is soft at 106/64, unable to increase rate control for this finding. He started back on anticoagulation right after his bladder resection 4/26 but had to stop it the following  Monday as he had increase hematuria. Hematuria  is improved off anticoagulation. He is c/o of being short of breath with exertion and weak, more so since his procedure. Marland Kitchen He feels terrible. He rolled out of bed last pm hitting his left eye on the bed stand and scraping his  rt knee. He was aware of his surroundings when he hit the floor. No  LOC. His hgb was found to be 7.5 and urology arranged for an outpt transfusion.   He is now  returned to afib clinic, 6/1,  to discuss tikosyn admit. Marland Kitchen He is feeling well better since infusion. He has seen minimal hematuria. He continues with fatigue and exertional dyspnea. He chemo was put on hold 2 weeks ago as he felt poorly. He feels it will resume next week. He remains in rate control afib. He has received both covid shots.   F/u in afib clinic for tikosyn admit, 6/15. He was to have his chemo last week but the oncologist said that he would delay until after Tikosyn load. No benadryl or no missed  DOAC for 3 weeks. No significant hematuria.   F/u in afib clinic, 6/25. He is here one week s/p Tikosyn. He had steroids and chemo on  Tues/Wed  but when he went on Thursday, he was noted to have bigeminy PVC's and chemo was not given. He was in rhythm. He does feel better in SR. He can walk further without any shortness of breath. The PVC's have been present before Tikosyn start and he was often in bigeminy PVC's while in the hospital, qtc is stable. I discussed with Dr.Allred, he suggested an updated echo but otherwise is not overly concerned as they were there  prior to tikosyn.   F/u in afib clinic, 11/13/19. He is being seen as he felt poorly yesterday and noted HR of 120 after walking. He was  questioning if he was back in Afib. His EKG shows SR today.  He has not had chemo x 2 weeks. Last  HGB also showed  a drop to 9.8 from  11.8 one month earlier at the [facility].  He also had a low K+, being treated with extra K+ with plans to recheck next week, being followed by Tommye Standard, PA. He continues on dofetilide and apixaban for a CHA2DS2VASc of 2.   F/u in afib clinic, 10/18. He remains in SR with PVC's, which are persistent for pt and preceded Tikosyn load. Overall, he feels well. He could have his recent chemo as his pts were low. He is to report again today to see if he can have chemo. No bleeding issues on eliquis.  Today, he denies symptoms of palpitations, chest pain, shortness of  breath, orthopnea, PND, lower extremity edema, dizziness, presyncope, syncope, or neurologic sequela.++ fatigue. The patient is tolerating medications without difficulties and is otherwise without complaint today.   Past Medical History:  Diagnosis Date  . Anemia   . Arthritis   . 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 ([place]) 09/29/2016  . Bladder tumor   . Chronic kidney disease    ckd stage 3, sees dr every 6 months, arf hemodialysis done x 2 2020  . Dyspnea    with exertion  . Dysrhythmia    atrial fib  . Essential hypertension 06/23/2018  . Goals of care, counseling/discussion 09/30/2016  . History of prostate cancer followed by pcp dr [name]-  per pt last PSA undetectable   dx 2008-- (Stage T1c, Gleason 3+3,  PSA 4.58, vol 99cc)  s/p  radical prostatectomy (nerve sparing bilateral)   . Hyperglycemia 06/23/2018  . Hypertension   . Mild hyperlipidemia 06/23/2018  . Pre-diabetes   . Wears glasses    Past Surgical History:  Procedure Laterality Date  . CARDIOVERSION N/A 05/18/2019   Procedure: CARDIOVERSION;  Surgeon: Lelon Perla, MD;  Location[facility] ENDOSCOPY;  Service: Cardiovascular;  Laterality: N/A;  . CARDIOVERSION N/A 09/27/2019   Procedure: CARDIOVERSION;  Surgeon: Donato Heinz, MD;  Location: [place];  Service: Cardiovascular;  Laterality: N/A;  . CATARACT EXTRACTION W/ INTRAOCULAR LENS  IMPLANT, BILATERAL Bilateral 2011  . [place]  . IR FLUORO GUIDE PORT INSERTION RIGHT  10/07/2016  . IR RADIOLOGIST EVAL & MGMT  01/05/2018  . IR US GUIDE VASC ACCESS RIGHT  10/07/2016  . KNEE ARTHROSCOPY Bilateral right 2006;  left 02-15-2007  . [place];  1990;  1983  . ROBOT ASSISTED LAPAROSCOPIC RADICAL PROSTATECTOMY  06/20/2006   bilateral nerve sparing  . TEE WITHOUT CARDIOVERSION N/A 06/27/2018   Procedure: TRANSESOPHAGEAL ECHOCARDIOGRAM (TEE);  Surgeon: Nigel Mormon, MD;  Location[facility] END[facility]COPY;   Service: Cardiovascular;  Laterality: N/A;  . TOTAL HIP ARTHROPLASTY Left 03/03/2015   Procedure: LEFT TOTAL HIP ARTHROPLASTY ANTERIOR APPROACH;  Surgeon: Dorna Leitz, MD;  Location: [place];  Service: Orthopedics;  Laterality: Left;  . TOTAL KNEE ARTHROPLASTY Bilateral left 08-13-2009;  right 12-26-2009  . TRANSURETHRAL RESECTION OF BLADDER TUMOR N/A 09/20/2016   Procedure: TRANSURETHRAL RESECTION OF BLADDER TUMOR (TURBT);  Surgeon: Franchot Gallo, MD;  Location: [facility];  Service: Urology;  Laterality: N/A;  . TRANSURETHRAL RESECTION OF BLADDER TUMOR WITH MITOMYCIN-C N/A 08/06/2019   Procedure: TRANSURETHRAL RESECTION OF BLADDER TUMOR;  Surgeon: Franchot Gallo, MD;  Location[facility];  Service: Urology;  Laterality: N/A;  Current Outpatient Medications  Medication Sig Dispense Refill  . acetaminophen (TYLENOL) 500 MG tablet Take 1,000 mg by mouth every 6 (six) hours as needed for mild pain or headache.    Marland Kitchen amoxicillin (AMOXIL) 500 MG capsule TAKE 4 CAPS (2,000 MG TOTAL) BY MOUTH ONCE FOR 1 DOSE. TAKE 1 HOUR PRIOR TO DENTAL PROCEDURE 4 capsule 4  . apixaban (ELIQUIS) 5 MG TABS tablet Take 1 tablet (5 mg total) by mouth 2 (two) times daily. 60 tablet 6  . atorvastatin (LIPITOR) 40 MG tablet Take 40 mg by mouth every evening.     . Cholecalciferol (VITAMIN D-3 PO) Take 1 capsule by mouth daily with breakfast.    . dexamethasone (DECADRON) 4 MG tablet Take 2 tablets (8 mg) daily for 3 days after chemotherapy. Take with food. 30 tablet 1  . diltiazem (CARDIZEM CD) 360 MG 24 hr capsule TAKE 1 CAPSULE BY MOUTH EVERY DAY (Patient taking differently: Take 360 mg by mouth daily. ) 90 capsule 1  . dofetilide (TIKOSYN) 250 MCG capsule Take 1 capsule (250 mcg total) by mouth 2 (two) times daily. 60 capsule 6  . DULoxetine (CYMBALTA) 60 MG capsule TAKE ONE CAPSULE BY MOUTH DAILY 30 capsule 3  . gabapentin (NEURONTIN) 400 MG capsule TAKE 1 CAPSULE (400 MG TOTAL)  BY MOUTH 4 (FOUR) TIMES DAILY. (Patient taking differently: Take 800 mg by mouth daily. ) 360 capsule 2  . lidocaine-prilocaine (EMLA) cream Apply to affected area once 30 g 3  . loperamide (IMODIUM A-D) 2 MG tablet Take 2 at diarrhea onset, then 1 every 2hr until 12hrs with no BM. May take 2 every 4hrs at night. If diarrhea recurs repeat. 100 tablet 1  . LORazepam (ATIVAN) 0.5 MG tablet Take 1 tablet (0.5 mg total) by mouth every 6 (six) hours as needed (Nausea or vomiting). 30 tablet 0  . magnesium oxide (MAG-OX) 400 MG tablet Take 1 tablet (400 mg total) by mouth 2 (two) times daily.    . metoprolol succinate (TOPROL-XL) 50 MG 24 hr tablet TAKE ONE TABLET BY MOUTH EVERY MORNING AND TAKE ONE TABLET BY MOUTH EVERY NIGHT AT BEDTIME 60 tablet 3  . oxybutynin (DITROPAN-XL) 10 MG 24 hr tablet Take 10 mg by mouth at bedtime.    . potassium chloride (KLOR-CON) 10 MEQ tablet Take 2 tablets (20 mEq total) by mouth daily.    . prochlorperazine (COMPAZINE) 10 MG tablet Take 1 tablet (10 mg total) by mouth every 6 (six) hours as needed (Nausea or vomiting). (Patient taking differently: Take 10 mg by mouth as needed (Nausea or vomiting). ) 30 tablet 1  . pyridoxine (B-6) 100 MG tablet Take 200 mg by mouth daily with breakfast.     . traMADol (ULTRAM) 50 MG tablet Take 50 mg by mouth daily as needed (for pain).     . cyclobenzaprine (FEXMID) 7.5 MG tablet Take 1 tablet (7.5 mg total) by mouth 3 (three) times daily as needed for muscle spasms. (Patient not taking: Reported on 01/28/2020) 60 tablet 2  . K Phos Mono-Sod Phos Di & Mono (K-PHOS-NEUTRAL) [phone] MG TABS Take 1 tablet by mouth 2 (two) times daily. (Patient not taking: Reported on 01/28/2020)     No current facility-administered medications for this encounter.   Facility-Administered Medications Ordered in Other Encounters  Medication Dose Route Frequency Provider Last Rate Last Admin  . sodium chloride flush (NS) 0.9 % injection 10 mL  10 mL  Intravenous PRN [place], Brand Males, NP  10 mL at 02/21/17 1038  . sodium chloride flush (NS) 0.9 % injection 10 mL  10 mL Intravenous PRN Volanda Napoleon, MD   10 mL at 07/10/18 0844  . sodium chloride flush (NS) 0.9 % injection 10 mL  10 mL Intravenous PRN Eliezer Bottom, NP   10 mL at 09/18/19 6144    No Known Allergies  Social History   Socioeconomic History  . Marital status: Married    Spouse name: Not on file  . Number of children: Not on file  . Years of education: Not on file  . Highest education level: Not on file  Occupational History  . Not on file  Tobacco Use  . Smoking status: Former Smoker    Packs/day: 1.00    Years: 16.00    Pack years: 16.00    Types: Cigarettes    Quit date: 09/25/1984    Years since quitting: 35.3  . Smokeless tobacco: Never Used  Vaping Use  . Vaping Use: Never used  Substance and Sexual Activity  . Alcohol use: Yes    Comment: occasionally  . Drug use: Yes    Types: Marijuana    Comment: after chemo to help sleep  . Sexual activity: Not on file  Other Topics Concern  . Not on file  Social History Narrative  . Not on file   Social Determinants of Health   Financial Resource Strain:   . Difficulty of Paying Living Expenses: Not on file  Food Insecurity:   . Worried About Charity fundraiser in the Last Year: Not on file  . Ran Out of Food in the Last Year: Not on file  Transportation Needs:   . Lack of Transportation (Medical): Not on file  . Lack of Transportation (Non-Medical): Not on file  Physical Activity:   . Days of Exercise per Week: Not on file  . Minutes of Exercise per Session: Not on file  Stress:   . Feeling of Stress : Not on file  Social Connections:   . Frequency of Communication with Friends and Family: Not on file  . Frequency of Social Gatherings with Friends and Family: Not on file  . Attends Religious Services: Not on file  . Active Member of Clubs or Organizations: Not on file  . Attends English as a second language teacher Meetings: Not on file  . Marital Status: Not on file  Intimate Partner Violence:   . Fear of Current or Ex-Partner: Not on file  . Emotionally Abused: Not on file  . Physically Abused: Not on file  . Sexually Abused: Not on file    Family History  Problem Relation Age of Onset  . Hypertension Mother   . Aneurysm Mother   . Emphysema Father   . Hypertension Father     ROS- All systems are reviewed and negative except as per the HPI above  Physical Exam: Vitals:   01/28/20 1040  BP: 118/70  Pulse: 80  Weight: 122.7 kg  Height: $Remove'6\' 2"'[id number]$  (1.88 m)   Wt Readings from Last 3 Encounters:  01/28/20 122.7 kg  01/21/20 122.9 kg  12/31/19 125 kg    Labs: Lab Results  Component Value Date   NA 136 01/28/2020   K 3.8 01/28/2020   CL 101 01/28/2020   CO2 24 01/28/2020   GLUCOSE 138 (H) 01/28/2020   BUN 37 (H) 01/28/2020   CREATININE 2.46 (H) 01/28/2020   CALCIUM 9.4 01/28/2020   PHOS 2.9 01/28/2020   MG  1.6 (L) 01/28/2020   Lab Results  Component Value Date   INR 1.3 (H) 09/26/2019   Lab Results  Component Value Date   CHOL 159 10/05/2018   HDL 47 10/05/2018   LDLCALC 86 10/05/2018   TRIG 131 10/05/2018     GEN- The patient is well appearing, alert and oriented x 3 today.   Head- normocephalic, atraumatic Eyes-  Sclera clear, conjunctiva pink Ears- hearing intact Oropharynx- clear Neck- supple, no JVP Lymph- no cervical lymphadenopathy Lungs- Clear to ausculation bilaterally, normal work of breathing Heart- regular rate and rhythm(pvc's), no murmurs, rubs or gallops, PMI not laterally displaced GI- soft, NT, ND, + BS Extremities- no clubbing, cyanosis, or edema MS- no significant deformity or atrophy Skin- no rash or lesion Psych- euthymic mood, full affect Neuro- strength and sensation are intact  EKG-SR with first degree AV block  pr int 233ms, qrs int 100 ms, qtc 479 ms, frequent PVC's seen  Epic records reviewed  Echo-1. The left  ventricle has normal systolic function, with an ejection  fraction of 55-60%. The cavity size was normal. Left ventricular diastolic  Doppler parameters are consistent with impaired relaxation.  2. The right ventricle has normal systolic function. The cavity was  mildly enlarged. There is no increase in right ventricular wall thickness.  3. Right atrial size was mildly dilated.  4. Moderate thickening of the aortic valve Mild calcification of the  aortic valve.  5. There is dilatation of the aortic root measuring 39 mm.  6. No vegetations detected.   Assessment and Plan: 1.  Persistent  Afib He is in rhythm  Continue  Metoprolol 50 mg bid    Continue  dofetilide 250 mcg bid   2. CHA2DS2VASc score of 2 Continue eliquis 5 mg bid   3. Bladder CA Per urology and oncology   4. PVC's  Present prior to Tikosyn start Present  today   5. Chronic anemia/Low plts  Per oncology  F/u in afib clinic in 4 months   Butch Penny C. Gerilynn Mccullars, [facility] [address] [place], [place] [address] [phone]

## 2020-01-28 NOTE — Progress Notes (Signed)
Pt discharged in no apparent distress. Pt left ambulatory without assistance. Pt aware of discharge instructions and verbalized understanding and had no further questions.  

## 2020-01-28 NOTE — Progress Notes (Signed)
OK to treat today with platelets-85 and creat-2.46 per order of [name].

## 2020-01-28 NOTE — Patient Instructions (Signed)
[facility] Discharge Instructions for Patients Receiving Chemotherapy  Today you received the following chemotherapy agents Trodelvy and Aranesp  To help prevent nausea and vomiting after your treatment, we encourage you to take your nausea medication as prescribed by MD. **DO NOT TAKE ZOFRAN FOR 3 DAYS AFTER CHEMOTHERAPY**   If you develop nausea and vomiting that is not controlled by your nausea medication, call the clinic.   BELOW ARE SYMPTOMS THAT SHOULD BE REPORTED IMMEDIATELY:  *FEVER GREATER THAN 100.5 F  *CHILLS WITH OR WITHOUT FEVER  NAUSEA AND VOMITING THAT IS NOT CONTROLLED WITH YOUR NAUSEA MEDICATION  *UNUSUAL SHORTNESS OF BREATH  *UNUSUAL BRUISING OR BLEEDING  TENDERNESS IN MOUTH AND THROAT WITH OR WITHOUT PRESENCE OF ULCERS  *URINARY PROBLEMS  *BOWEL PROBLEMS  UNUSUAL RASH Items with * indicate a potential emergency and should be followed up as soon as possible.  Feel free to call the clinic should you have any questions or concerns. The clinic phone number is [phone].  Please show the [place] at check-in to the Emergency Department and triage nurse.

## 2020-01-28 NOTE — Addendum Note (Signed)
Encounter addended by: Sherran Needs, NP on: 01/28/2020 1:28 PM  Actions taken: Clinical Note Signed, Medication List reviewed, Problem List reviewed, Allergies reviewed

## 2020-01-29 DIAGNOSIS — M6281 Muscle weakness (generalized): Secondary | ICD-10-CM | POA: Diagnosis not present

## 2020-02-03 ENCOUNTER — Encounter (HOSPITAL_COMMUNITY): Payer: Self-pay | Admitting: *Deleted

## 2020-02-03 ENCOUNTER — Inpatient Hospital Stay (HOSPITAL_COMMUNITY)
Admission: EM | Admit: 2020-02-03 | Discharge: 2020-02-25 | DRG: 871 | Disposition: A | Discharge status: Home Health | Payer: PPO | Attending: Internal Medicine | Admitting: Internal Medicine

## 2020-02-03 ENCOUNTER — Other Ambulatory Visit: Payer: Self-pay

## 2020-02-03 DIAGNOSIS — T451X5A Adverse effect of antineoplastic and immunosuppressive drugs, initial encounter: Secondary | ICD-10-CM | POA: Diagnosis not present

## 2020-02-03 DIAGNOSIS — K6389 Other specified diseases of intestine: Secondary | ICD-10-CM | POA: Diagnosis not present

## 2020-02-03 DIAGNOSIS — K921 Melena: Secondary | ICD-10-CM | POA: Diagnosis not present

## 2020-02-03 DIAGNOSIS — A4159 Other Gram-negative sepsis: Secondary | ICD-10-CM | POA: Diagnosis not present

## 2020-02-03 DIAGNOSIS — R319 Hematuria, unspecified: Secondary | ICD-10-CM | POA: Diagnosis present

## 2020-02-03 DIAGNOSIS — C772 Secondary and unspecified malignant neoplasm of intra-abdominal lymph nodes: Secondary | ICD-10-CM | POA: Diagnosis not present

## 2020-02-03 DIAGNOSIS — E876 Hypokalemia: Secondary | ICD-10-CM | POA: Diagnosis not present

## 2020-02-03 DIAGNOSIS — I4891 Unspecified atrial fibrillation: Secondary | ICD-10-CM

## 2020-02-03 DIAGNOSIS — Y92009 Unspecified place in unspecified non-institutional (private) residence as the place of occurrence of the external cause: Secondary | ICD-10-CM

## 2020-02-03 DIAGNOSIS — K3189 Other diseases of stomach and duodenum: Secondary | ICD-10-CM | POA: Diagnosis present

## 2020-02-03 DIAGNOSIS — I4819 Other persistent atrial fibrillation: Secondary | ICD-10-CM | POA: Diagnosis present

## 2020-02-03 DIAGNOSIS — Z9842 Cataract extraction status, left eye: Secondary | ICD-10-CM | POA: Diagnosis not present

## 2020-02-03 DIAGNOSIS — R7303 Prediabetes: Secondary | ICD-10-CM | POA: Diagnosis not present

## 2020-02-03 DIAGNOSIS — E872 Acidosis: Secondary | ICD-10-CM | POA: Diagnosis not present

## 2020-02-03 DIAGNOSIS — R531 Weakness: Secondary | ICD-10-CM

## 2020-02-03 DIAGNOSIS — K567 Ileus, unspecified: Secondary | ICD-10-CM

## 2020-02-03 DIAGNOSIS — M1990 Unspecified osteoarthritis, unspecified site: Secondary | ICD-10-CM | POA: Diagnosis present

## 2020-02-03 DIAGNOSIS — N170 Acute kidney failure with tubular necrosis: Secondary | ICD-10-CM

## 2020-02-03 DIAGNOSIS — R5081 Fever presenting with conditions classified elsewhere: Secondary | ICD-10-CM | POA: Diagnosis not present

## 2020-02-03 DIAGNOSIS — C679 Malignant neoplasm of bladder, unspecified: Secondary | ICD-10-CM

## 2020-02-03 DIAGNOSIS — D500 Iron deficiency anemia secondary to blood loss (chronic): Secondary | ICD-10-CM

## 2020-02-03 DIAGNOSIS — Z8546 Personal history of malignant neoplasm of prostate: Secondary | ICD-10-CM

## 2020-02-03 DIAGNOSIS — Z6836 Body mass index (BMI) 36.0-36.9, adult: Secondary | ICD-10-CM

## 2020-02-03 DIAGNOSIS — D649 Anemia, unspecified: Secondary | ICD-10-CM | POA: Diagnosis not present

## 2020-02-03 DIAGNOSIS — Z4682 Encounter for fitting and adjustment of non-vascular catheter: Secondary | ICD-10-CM | POA: Diagnosis not present

## 2020-02-03 DIAGNOSIS — D696 Thrombocytopenia, unspecified: Secondary | ICD-10-CM

## 2020-02-03 DIAGNOSIS — E860 Dehydration: Secondary | ICD-10-CM | POA: Diagnosis present

## 2020-02-03 DIAGNOSIS — B961 Klebsiella pneumoniae [K. pneumoniae] as the cause of diseases classified elsewhere: Secondary | ICD-10-CM | POA: Diagnosis present

## 2020-02-03 DIAGNOSIS — R197 Diarrhea, unspecified: Secondary | ICD-10-CM

## 2020-02-03 DIAGNOSIS — K5730 Diverticulosis of large intestine without perforation or abscess without bleeding: Secondary | ICD-10-CM | POA: Diagnosis not present

## 2020-02-03 DIAGNOSIS — N19 Unspecified kidney failure: Secondary | ICD-10-CM | POA: Diagnosis not present

## 2020-02-03 DIAGNOSIS — I482 Chronic atrial fibrillation, unspecified: Secondary | ICD-10-CM

## 2020-02-03 DIAGNOSIS — R140 Abdominal distension (gaseous): Secondary | ICD-10-CM | POA: Diagnosis not present

## 2020-02-03 DIAGNOSIS — D61810 Antineoplastic chemotherapy induced pancytopenia: Secondary | ICD-10-CM | POA: Diagnosis present

## 2020-02-03 DIAGNOSIS — D701 Agranulocytosis secondary to cancer chemotherapy: Principal | ICD-10-CM

## 2020-02-03 DIAGNOSIS — R627 Adult failure to thrive: Secondary | ICD-10-CM

## 2020-02-03 DIAGNOSIS — Z9841 Cataract extraction status, right eye: Secondary | ICD-10-CM

## 2020-02-03 DIAGNOSIS — Z20822 Contact with and (suspected) exposure to covid-19: Secondary | ICD-10-CM | POA: Diagnosis not present

## 2020-02-03 DIAGNOSIS — C775 Secondary and unspecified malignant neoplasm of intrapelvic lymph nodes: Secondary | ICD-10-CM

## 2020-02-03 DIAGNOSIS — Z8249 Family history of ischemic heart disease and other diseases of the circulatory system: Secondary | ICD-10-CM

## 2020-02-03 DIAGNOSIS — M47816 Spondylosis without myelopathy or radiculopathy, lumbar region: Secondary | ICD-10-CM | POA: Diagnosis not present

## 2020-02-03 DIAGNOSIS — E875 Hyperkalemia: Secondary | ICD-10-CM | POA: Diagnosis not present

## 2020-02-03 DIAGNOSIS — I10 Essential (primary) hypertension: Secondary | ICD-10-CM | POA: Diagnosis not present

## 2020-02-03 DIAGNOSIS — N183 Chronic kidney disease, stage 3 unspecified: Secondary | ICD-10-CM | POA: Diagnosis not present

## 2020-02-03 DIAGNOSIS — D6959 Other secondary thrombocytopenia: Secondary | ICD-10-CM | POA: Diagnosis present

## 2020-02-03 DIAGNOSIS — N184 Chronic kidney disease, stage 4 (severe): Secondary | ICD-10-CM | POA: Diagnosis not present

## 2020-02-03 DIAGNOSIS — Z96642 Presence of left artificial hip joint: Secondary | ICD-10-CM | POA: Diagnosis present

## 2020-02-03 DIAGNOSIS — D709 Neutropenia, unspecified: Secondary | ICD-10-CM | POA: Diagnosis not present

## 2020-02-03 DIAGNOSIS — I493 Ventricular premature depolarization: Secondary | ICD-10-CM | POA: Diagnosis present

## 2020-02-03 DIAGNOSIS — K259 Gastric ulcer, unspecified as acute or chronic, without hemorrhage or perforation: Secondary | ICD-10-CM | POA: Diagnosis not present

## 2020-02-03 DIAGNOSIS — N1831 Chronic kidney disease, stage 3a: Secondary | ICD-10-CM

## 2020-02-03 DIAGNOSIS — K766 Portal hypertension: Secondary | ICD-10-CM | POA: Diagnosis not present

## 2020-02-03 DIAGNOSIS — N179 Acute kidney failure, unspecified: Secondary | ICD-10-CM | POA: Diagnosis present

## 2020-02-03 DIAGNOSIS — W19XXXA Unspecified fall, initial encounter: Secondary | ICD-10-CM

## 2020-02-03 DIAGNOSIS — D630 Anemia in neoplastic disease: Secondary | ICD-10-CM | POA: Diagnosis present

## 2020-02-03 DIAGNOSIS — K2970 Gastritis, unspecified, without bleeding: Secondary | ICD-10-CM | POA: Diagnosis not present

## 2020-02-03 DIAGNOSIS — Z96653 Presence of artificial knee joint, bilateral: Secondary | ICD-10-CM | POA: Diagnosis not present

## 2020-02-03 DIAGNOSIS — I129 Hypertensive chronic kidney disease with stage 1 through stage 4 chronic kidney disease, or unspecified chronic kidney disease: Secondary | ICD-10-CM | POA: Diagnosis present

## 2020-02-03 DIAGNOSIS — D61818 Other pancytopenia: Secondary | ICD-10-CM | POA: Diagnosis not present

## 2020-02-03 DIAGNOSIS — R21 Rash and other nonspecific skin eruption: Secondary | ICD-10-CM | POA: Diagnosis not present

## 2020-02-03 DIAGNOSIS — I340 Nonrheumatic mitral (valve) insufficiency: Secondary | ICD-10-CM | POA: Diagnosis present

## 2020-02-03 DIAGNOSIS — E669 Obesity, unspecified: Secondary | ICD-10-CM | POA: Diagnosis present

## 2020-02-03 DIAGNOSIS — I959 Hypotension, unspecified: Secondary | ICD-10-CM | POA: Diagnosis not present

## 2020-02-03 DIAGNOSIS — E861 Hypovolemia: Secondary | ICD-10-CM | POA: Diagnosis present

## 2020-02-03 DIAGNOSIS — L299 Pruritus, unspecified: Secondary | ICD-10-CM | POA: Diagnosis not present

## 2020-02-03 DIAGNOSIS — Z0189 Encounter for other specified special examinations: Secondary | ICD-10-CM

## 2020-02-03 DIAGNOSIS — Z87891 Personal history of nicotine dependence: Secondary | ICD-10-CM

## 2020-02-03 DIAGNOSIS — K521 Toxic gastroenteritis and colitis: Secondary | ICD-10-CM | POA: Diagnosis present

## 2020-02-03 DIAGNOSIS — D62 Acute posthemorrhagic anemia: Secondary | ICD-10-CM | POA: Diagnosis not present

## 2020-02-03 DIAGNOSIS — K922 Gastrointestinal hemorrhage, unspecified: Secondary | ICD-10-CM | POA: Diagnosis not present

## 2020-02-03 DIAGNOSIS — C7951 Secondary malignant neoplasm of bone: Secondary | ICD-10-CM | POA: Diagnosis not present

## 2020-02-03 DIAGNOSIS — R000 Tachycardia, unspecified: Secondary | ICD-10-CM | POA: Diagnosis not present

## 2020-02-03 DIAGNOSIS — Z9079 Acquired absence of other genital organ(s): Secondary | ICD-10-CM

## 2020-02-03 DIAGNOSIS — Z79899 Other long term (current) drug therapy: Secondary | ICD-10-CM

## 2020-02-03 DIAGNOSIS — Z961 Presence of intraocular lens: Secondary | ICD-10-CM | POA: Diagnosis present

## 2020-02-03 DIAGNOSIS — R6520 Severe sepsis without septic shock: Secondary | ICD-10-CM | POA: Diagnosis present

## 2020-02-03 DIAGNOSIS — E785 Hyperlipidemia, unspecified: Secondary | ICD-10-CM | POA: Diagnosis present

## 2020-02-03 DIAGNOSIS — I480 Paroxysmal atrial fibrillation: Secondary | ICD-10-CM | POA: Diagnosis not present

## 2020-02-03 DIAGNOSIS — Z825 Family history of asthma and other chronic lower respiratory diseases: Secondary | ICD-10-CM

## 2020-02-03 DIAGNOSIS — E7800 Pure hypercholesterolemia, unspecified: Secondary | ICD-10-CM | POA: Diagnosis not present

## 2020-02-03 DIAGNOSIS — N1832 Chronic kidney disease, stage 3b: Secondary | ICD-10-CM | POA: Diagnosis not present

## 2020-02-03 DIAGNOSIS — R296 Repeated falls: Secondary | ICD-10-CM

## 2020-02-03 DIAGNOSIS — Z4659 Encounter for fitting and adjustment of other gastrointestinal appliance and device: Secondary | ICD-10-CM

## 2020-02-03 DIAGNOSIS — Z7901 Long term (current) use of anticoagulants: Secondary | ICD-10-CM

## 2020-02-03 DIAGNOSIS — D631 Anemia in chronic kidney disease: Secondary | ICD-10-CM | POA: Diagnosis present

## 2020-02-03 DIAGNOSIS — K56699 Other intestinal obstruction unspecified as to partial versus complete obstruction: Secondary | ICD-10-CM | POA: Diagnosis not present

## 2020-02-03 LAB — CBC WITH DIFFERENTIAL/PLATELET
Abs Immature Granulocytes: 0 10*3/uL (ref 0.00–0.07)
Basophils Absolute: 0 10*3/uL (ref 0.0–0.1)
Basophils Relative: 0 %
Eosinophils Absolute: 0 10*3/uL (ref 0.0–0.5)
Eosinophils Relative: 7 %
HCT: 26 % — ABNORMAL LOW (ref 39.0–52.0)
Hemoglobin: 8.6 g/dL — ABNORMAL LOW (ref 13.0–17.0)
Immature Granulocytes: 0 %
Lymphocytes Relative: 81 %
Lymphs Abs: 0.1 10*3/uL — ABNORMAL LOW (ref 0.7–4.0)
MCH: 33.9 pg (ref 26.0–34.0)
MCHC: 33.1 g/dL (ref 30.0–36.0)
MCV: 102.4 fL — ABNORMAL HIGH (ref 80.0–100.0)
Monocytes Absolute: 0 10*3/uL — ABNORMAL LOW (ref 0.1–1.0)
Monocytes Relative: 6 %
Neutro Abs: 0 10*3/uL — ABNORMAL LOW (ref 1.7–7.7)
Neutrophils Relative %: 6 %
Platelets: 30 10*3/uL — ABNORMAL LOW (ref 150–400)
RBC: 2.54 MIL/uL — ABNORMAL LOW (ref 4.22–5.81)
RDW: 13.8 % (ref 11.5–15.5)
WBC: 0.2 10*3/uL — CL (ref 4.0–10.5)
nRBC: 0 % (ref 0.0–0.2)

## 2020-02-03 LAB — RESPIRATORY PANEL BY RT PCR (FLU A&B, COVID)
Influenza A by PCR: NEGATIVE
Influenza B by PCR: NEGATIVE
SARS Coronavirus 2 by RT PCR: NEGATIVE

## 2020-02-03 LAB — COMPREHENSIVE METABOLIC PANEL
ALT: 33 U/L (ref 0–44)
AST: 25 U/L (ref 15–41)
Albumin: 3 g/dL — ABNORMAL LOW (ref 3.5–5.0)
Alkaline Phosphatase: 69 U/L (ref 38–126)
Anion gap: 10 (ref 5–15)
BUN: 43 mg/dL — ABNORMAL HIGH (ref 8–23)
CO2: 21 mmol/L — ABNORMAL LOW (ref 22–32)
Calcium: 8 mg/dL — ABNORMAL LOW (ref 8.9–10.3)
Chloride: 99 mmol/L (ref 98–111)
Creatinine, Ser: 2.55 mg/dL — ABNORMAL HIGH (ref 0.61–1.24)
GFR, Estimated: 27 mL/min — ABNORMAL LOW (ref >60)
Glucose, Bld: 202 mg/dL — ABNORMAL HIGH (ref 70–99)
Potassium: 4 mmol/L (ref 3.5–5.1)
Sodium: 130 mmol/L — ABNORMAL LOW (ref 135–145)
Total Bilirubin: 1.2 mg/dL (ref 0.3–1.2)
Total Protein: 5.4 g/dL — ABNORMAL LOW (ref 6.5–8.1)

## 2020-02-03 LAB — URINALYSIS, ROUTINE W REFLEX MICROSCOPIC
Bilirubin Urine: NEGATIVE
Glucose, UA: >=500 mg/dL — AB
Ketones, ur: NEGATIVE mg/dL
Leukocytes,Ua: NEGATIVE
Nitrite: NEGATIVE
Protein, ur: 100 mg/dL — AB
RBC / HPF: >50 RBC/hpf — ABNORMAL HIGH (ref 0–5)
Specific Gravity, Urine: 1.012 (ref 1.005–1.030)
pH: 5 (ref 5.0–8.0)

## 2020-02-03 LAB — MAGNESIUM: Magnesium: 1.5 mg/dL — ABNORMAL LOW (ref 1.7–2.4)

## 2020-02-03 IMAGING — CT CT BONE MARROW BIOPSY AND ASPIRATION
1 of 2 series · 15 of 32 positions shown, 19 images · non-contrast
Comparison: none

INDICATION: 68-year-old with history of bladder cancer, renal failure and
thrombocytopenia.

[Series 4: i-spiral 5.0 b40f · axial · 0.96mm/px · z∈[+1114,+1229]mm · 15 of 37 slices shown, 19 images]
[im 2/37  soft-tissue]
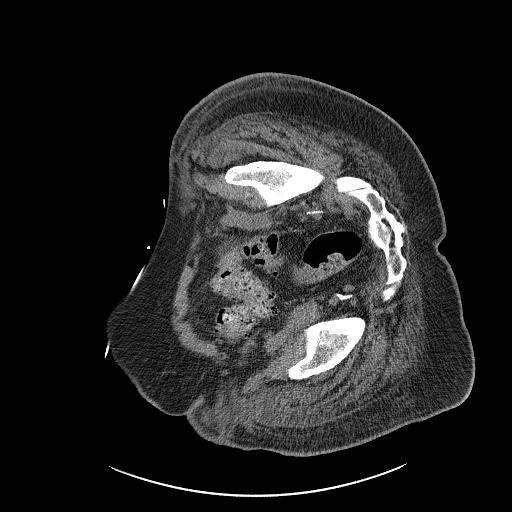
[im 2/37  bone]
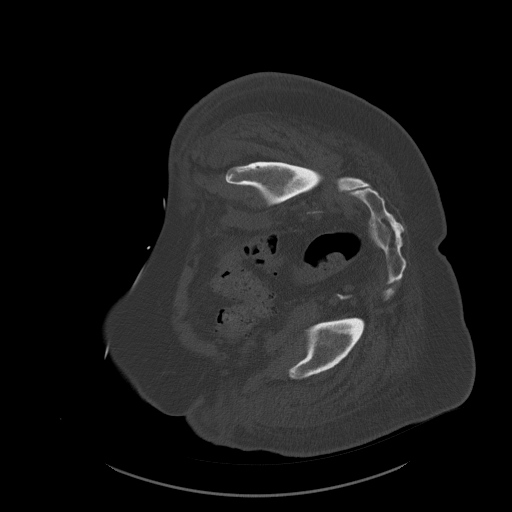
[im 5/37  soft-tissue]
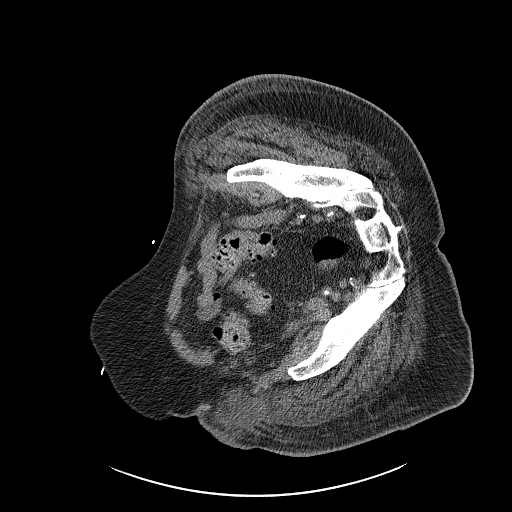
[im 8/37  soft-tissue]
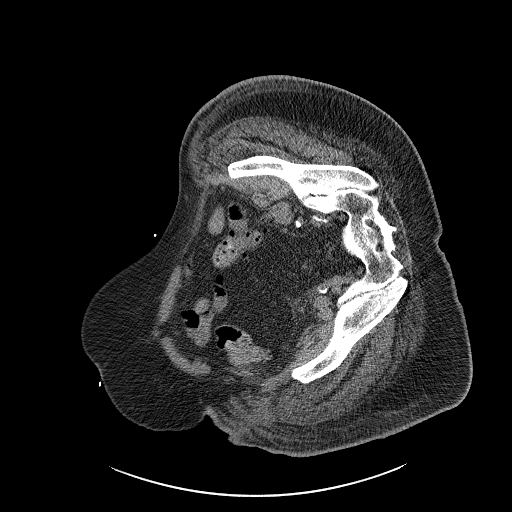
[im 11/37  soft-tissue]
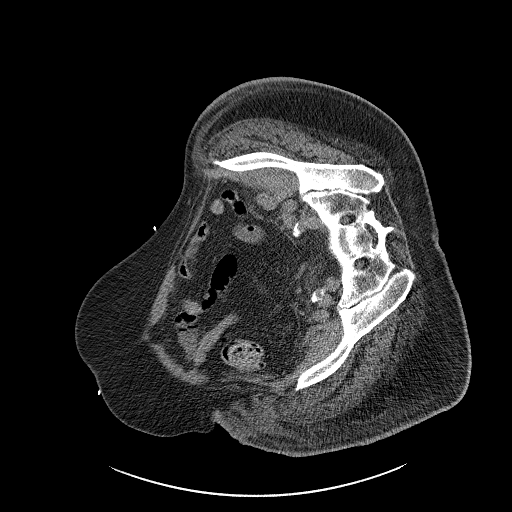
[im 13/37  soft-tissue]
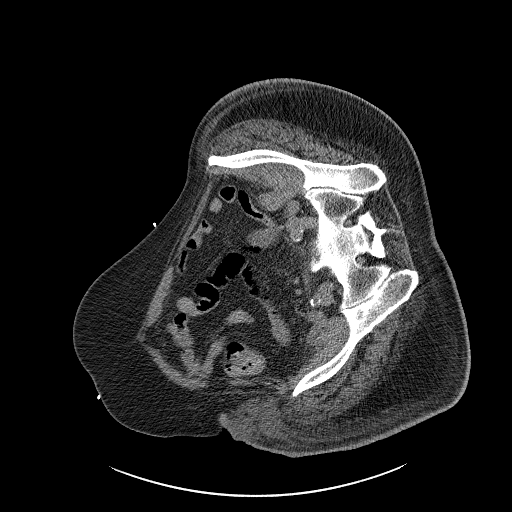
[im 16/37  soft-tissue]
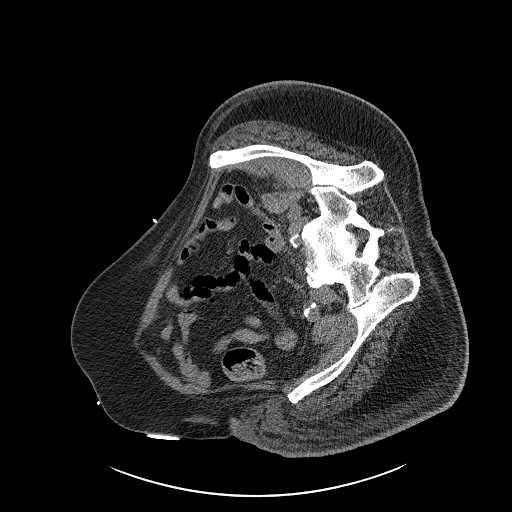
[im 19/37  soft-tissue]
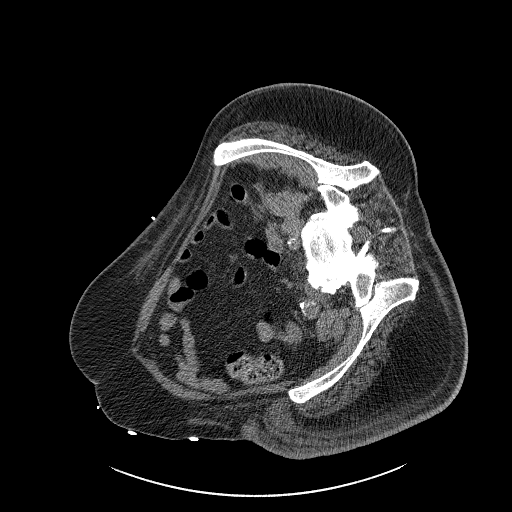
[im 21/37  soft-tissue]
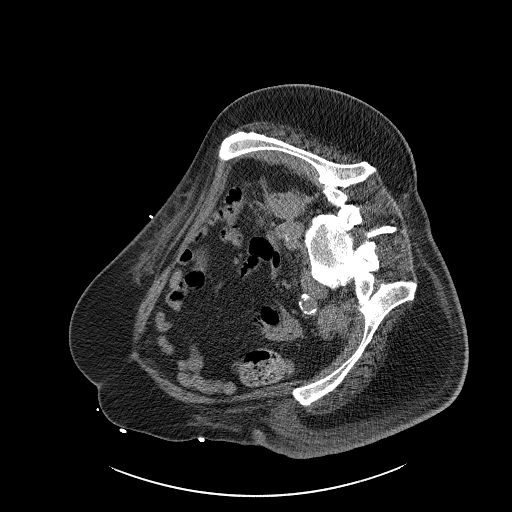
[im 24/37  soft-tissue]
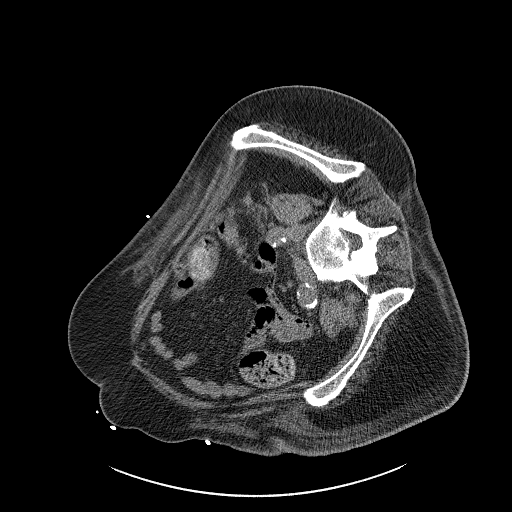
[im 24/37  bone]
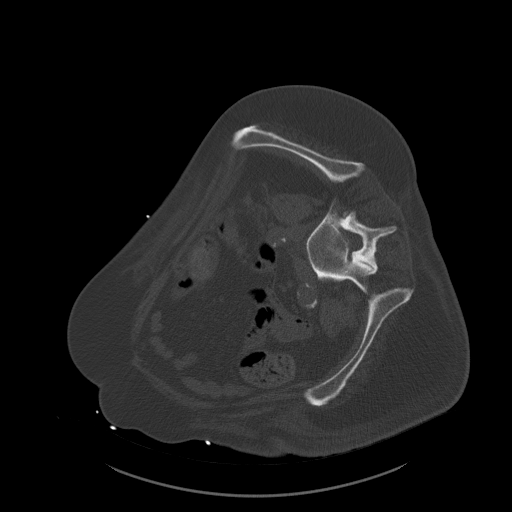
[im 26/37  soft-tissue]
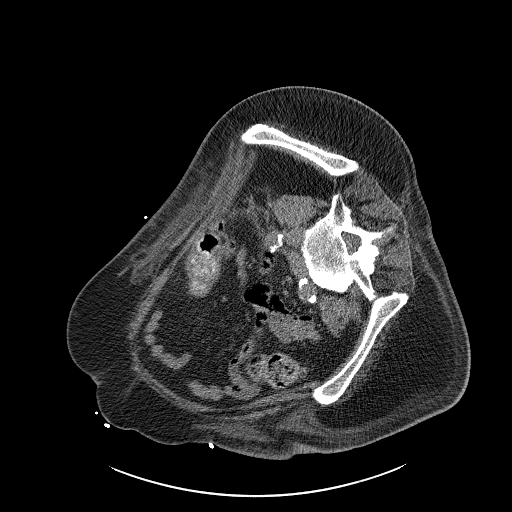
[im 29/37  soft-tissue]
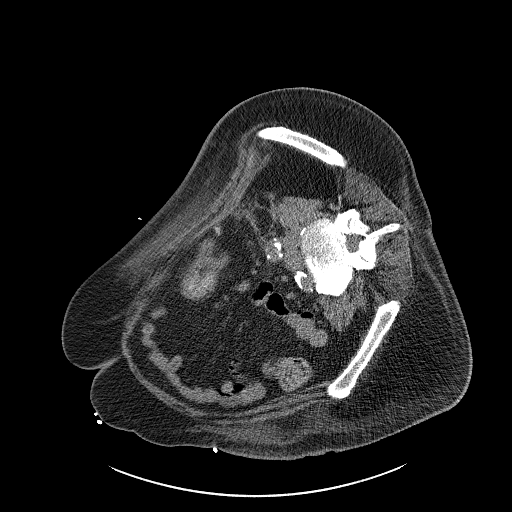
[im 31/37  lung]
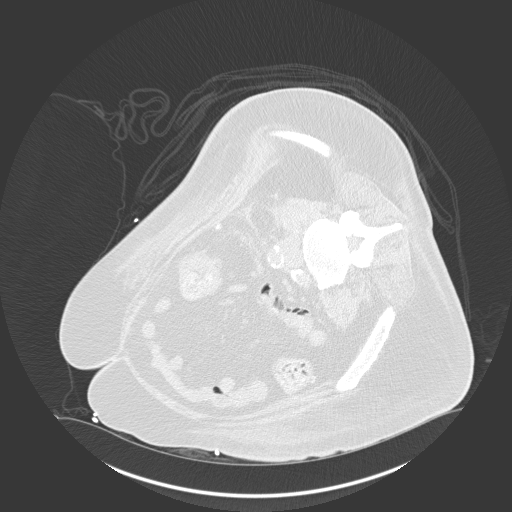
[im 32/37  soft-tissue]
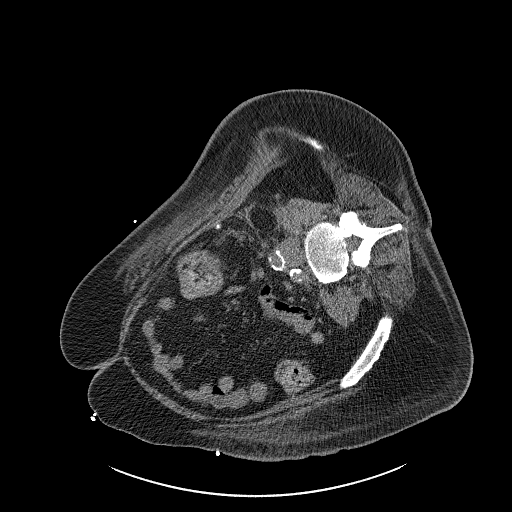
[im 32/37  lung]
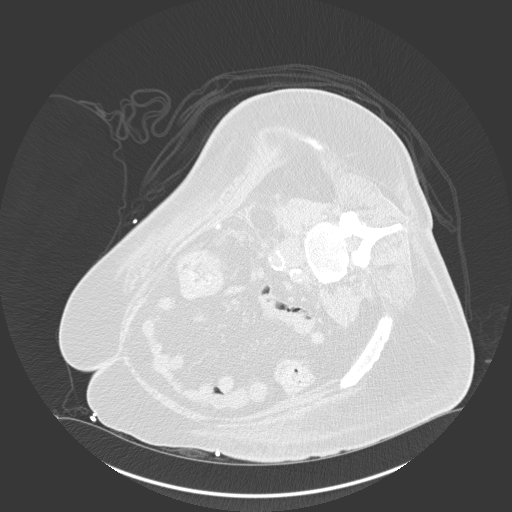
[im 34/37  lung]
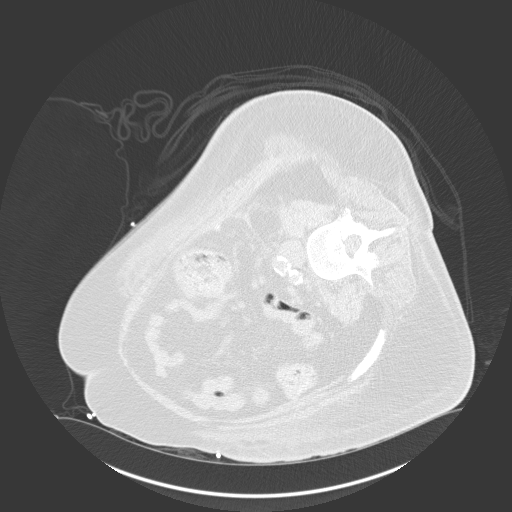
[im 35/37  soft-tissue]
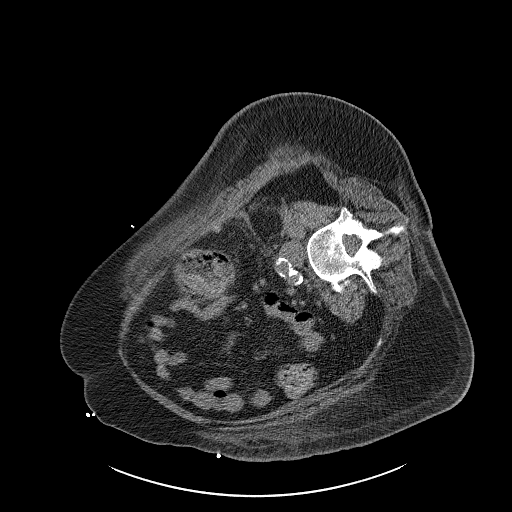
[im 35/37  lung]
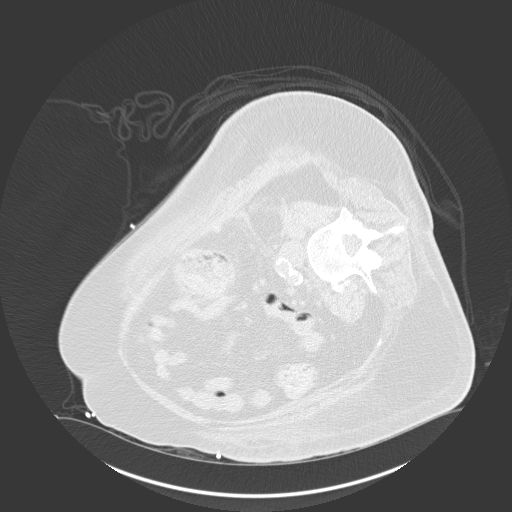

[15 of 32 positions shown; findings below may reference images not displayed]

EXAM:
CT GUIDED BONE MARROW ASPIRATES AND BIOPSY

MEDICATIONS:
None.

ANESTHESIA/SEDATION:
Fentanyl 50 mcg IV; Versed 1.0 mg IV

Moderate Sedation Time:  12 minutes

The patient was continuously monitored during the procedure by the
interventional radiology nurse under my direct supervision.

COMPLICATIONS:
None immediate.

PROCEDURE:
The procedure was explained to the patient. The risks and benefits
of the procedure were discussed and the patient's questions were
addressed. Informed consent was obtained from the patient. The
patient was placed on the CT table with left side down. Images of
the pelvis were obtained. The back was prepped and draped in sterile
fashion. The skin and left posterior ilium were anesthetized with 1%
lidocaine. 11 gauge bone needle was directed into the left ilium
with CT guidance. Two aspirates and one core biopsy were obtained.
Bandage placed over the puncture site.
IMPRESSION: CT guided bone marrow aspiration and core biopsy.

## 2020-02-03 MED ORDER — ONDANSETRON HCL 4 MG/2ML IJ SOLN
4.0000 mg | Freq: Four times a day (QID) | INTRAMUSCULAR | Status: DC | PRN
  Administered 2020-02-07: 4 mg via INTRAVENOUS
  Filled 2020-02-03: qty 2

## 2020-02-03 MED ORDER — MAGNESIUM OXIDE 400 (241.3 MG) MG PO TABS
400.0000 mg | ORAL_TABLET | Freq: Once | ORAL | Status: AC
  Administered 2020-02-03: 400 mg via ORAL
  Filled 2020-02-03: qty 1

## 2020-02-03 MED ORDER — METOPROLOL SUCCINATE ER 50 MG PO TB24
50.0000 mg | ORAL_TABLET | Freq: Two times a day (BID) | ORAL | Status: DC
  Administered 2020-02-03 – 2020-02-04 (×2): 50 mg via ORAL
  Filled 2020-02-03 (×4): qty 1

## 2020-02-03 MED ORDER — APIXABAN 5 MG PO TABS: 5.0000 mg | ORAL_TABLET | Freq: Two times a day (BID) | ORAL | Status: DC

## 2020-02-03 MED ORDER — OXYBUTYNIN CHLORIDE ER 10 MG PO TB24
10.0000 mg | ORAL_TABLET | Freq: Every day | ORAL | Status: DC
  Administered 2020-02-03 – 2020-02-24 (×18): 10 mg via ORAL
  Filled 2020-02-03 (×2): qty 1
  Filled 2020-02-03: qty 2
  Filled 2020-02-03 (×5): qty 1
  Filled 2020-02-03: qty 2
  Filled 2020-02-03 (×6): qty 1
  Filled 2020-02-03: qty 2
  Filled 2020-02-03 (×2): qty 1
  Filled 2020-02-03: qty 2

## 2020-02-03 MED ORDER — DOFETILIDE 250 MCG PO CAPS
250.0000 ug | ORAL_CAPSULE | Freq: Two times a day (BID) | ORAL | Status: DC
  Administered 2020-02-03: 250 ug via ORAL
  Filled 2020-02-03 (×3): qty 1

## 2020-02-03 MED ORDER — GABAPENTIN 400 MG PO CAPS
800.0000 mg | ORAL_CAPSULE | Freq: Every day | ORAL | Status: DC
  Administered 2020-02-03 – 2020-02-04 (×2): 800 mg via ORAL
  Filled 2020-02-03 (×2): qty 2

## 2020-02-03 MED ORDER — SODIUM CHLORIDE 0.9 % IV BOLUS
1000.0000 mL | Freq: Once | INTRAVENOUS | Status: AC
  Administered 2020-02-03: 1000 mL via INTRAVENOUS

## 2020-02-03 MED ORDER — ONDANSETRON HCL 4 MG PO TABS: 4.0000 mg | ORAL_TABLET | Freq: Four times a day (QID) | ORAL | Status: DC | PRN

## 2020-02-03 MED ORDER — DILTIAZEM HCL ER COATED BEADS 180 MG PO CP24: 360.0000 mg | ORAL_CAPSULE | Freq: Every day | ORAL | Status: DC

## 2020-02-03 MED ORDER — VITAMIN B-6 50 MG PO TABS
200.0000 mg | ORAL_TABLET | Freq: Every day | ORAL | Status: DC
  Administered 2020-02-04 – 2020-02-12 (×7): 200 mg via ORAL
  Filled 2020-02-03 (×9): qty 4

## 2020-02-03 MED ORDER — POTASSIUM CHLORIDE CRYS ER 20 MEQ PO TBCR
20.0000 meq | EXTENDED_RELEASE_TABLET | Freq: Every day | ORAL | Status: DC
  Administered 2020-02-04: 20 meq via ORAL
  Filled 2020-02-03: qty 1

## 2020-02-03 MED ORDER — LOPERAMIDE HCL 2 MG PO CAPS
2.0000 mg | ORAL_CAPSULE | Freq: Two times a day (BID) | ORAL | Status: DC | PRN
  Administered 2020-02-04: 2 mg via ORAL
  Filled 2020-02-03: qty 1

## 2020-02-03 MED ORDER — ACETAMINOPHEN 650 MG RE SUPP: 650.0000 mg | Freq: Four times a day (QID) | RECTAL | Status: DC | PRN

## 2020-02-03 MED ORDER — ATORVASTATIN CALCIUM 40 MG PO TABS
40.0000 mg | ORAL_TABLET | Freq: Every evening | ORAL | Status: DC
  Administered 2020-02-03 – 2020-02-08 (×5): 40 mg via ORAL
  Filled 2020-02-03 (×8): qty 1

## 2020-02-03 MED ORDER — SODIUM CHLORIDE 0.9 % IV SOLN
2.0000 g | Freq: Two times a day (BID) | INTRAVENOUS | Status: DC
  Administered 2020-02-03 – 2020-02-04 (×2): 2 g via INTRAVENOUS
  Filled 2020-02-03 (×2): qty 2

## 2020-02-03 MED ORDER — DULOXETINE HCL 60 MG PO CPEP
60.0000 mg | ORAL_CAPSULE | Freq: Every day | ORAL | Status: DC
  Administered 2020-02-03 – 2020-02-06 (×4): 60 mg via ORAL
  Filled 2020-02-03 (×4): qty 1

## 2020-02-03 MED ORDER — MAGNESIUM OXIDE 400 (241.3 MG) MG PO TABS
400.0000 mg | ORAL_TABLET | Freq: Two times a day (BID) | ORAL | Status: DC
  Administered 2020-02-03: 400 mg via ORAL
  Filled 2020-02-03: qty 1

## 2020-02-03 MED ORDER — ACETAMINOPHEN 325 MG PO TABS
650.0000 mg | ORAL_TABLET | Freq: Four times a day (QID) | ORAL | Status: DC | PRN
  Administered 2020-02-03 – 2020-02-05 (×4): 650 mg via ORAL
  Filled 2020-02-03 (×4): qty 2

## 2020-02-03 NOTE — Progress Notes (Signed)
   02/03/20 1809  Assess: MEWS Score  Temp (!) 100.6 F (38.1 C)  BP 91/65  Pulse Rate 72  Resp 18  SpO2 99 %  Assess: MEWS Score  MEWS Temp 1  MEWS Systolic 1  MEWS Pulse 0  MEWS RR 0  MEWS LOC 0  MEWS Score 2  MEWS Score Color Yellow  Assess: if the MEWS score is Yellow or Red  Were vital signs taken at a resting state? Yes  Focused Assessment No change from prior assessment  Early Detection of Sepsis Score *See Row Information* Medium  MEWS guidelines implemented *See Row Information* Yes  Treat  MEWS Interventions Administered prn meds/treatments  Pain Scale 0-10  Pain Score 0  Take Vital Signs  Increase Vital Sign Frequency  Yellow: Q 2hr X 2 then Q 4hr X 2, if remains yellow, continue Q 4hrs  Escalate  MEWS: Escalate Yellow: discuss with charge nurse/RN and consider discussing with provider and RRT  Notify: Charge Nurse/RN  Name of Charge Nurse/RN Notified [place], RN  Date Charge Nurse/RN Notified 02/03/20  Time Charge Nurse/RN Notified 1610  Notify: Provider  Provider Name/Title Marva Panda  Date Provider Notified 02/03/20  Time Provider Notified 1819  Notification Type Page  Response See new orders  Date of Provider Response 02/03/20  Time of Provider Response 1820   MD notified of patients Temp and BP. Per MD give patient 1L [place] & he will enter orders for blood cultures & antibiotics. Patient is currently in stable conditions.

## 2020-02-03 NOTE — ED Provider Notes (Signed)
[facility] DEPT Provider Note   CSN: [id number] Arrival date & time: 02/03/20  1030     History No chief complaint on file.   [name] Rose is a 69 y.o. male.  Patient with history of metastatic bladder cancer, currently undergoing chemotherapy, last chemotherapy treatment 6 days ago Ivette Loyal) presents the emergency department with complaint of weakness and diarrhea.  Patient states that he has gotten mild diarrhea after previous chemotherapy treatments.  He has had 4 to 5 days of watery nonbloody stools, occurring 2-3 times a day.  He has had decreased appetite but no vomiting.  No abdominal pain, chest pain, shortness of breath.  He denies fevers.  Last ate a [employer] yesterday for lunch.  He has been having falls while transferring, to this morning.  He sustained abrasions to his legs into his right flank.  No head injuries reported.  EMS was called for transport this morning due to weakness.  He was given 200 cc normal saline in route.  He was hypotensive.  Patient states that he has had low platelets related to chemotherapy in the past month.        Past Medical History:  Diagnosis Date  . Anemia   . Arthritis   . 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 ([place]) 09/29/2016  . Bladder tumor   . Chronic kidney disease    ckd stage 3, sees dr every 6 months, arf hemodialysis done x 2 2020  . Dyspnea    with exertion  . Dysrhythmia    atrial fib  . Essential hypertension 06/23/2018  . Goals of care, counseling/discussion 09/30/2016  . History of prostate cancer followed by pcp dr [name]-  per pt last PSA undetectable   dx 2008-- (Stage T1c, Gleason 3+3,  PSA 4.58, vol 99cc)  s/p  radical prostatectomy (nerve sparing bilateral)   . Hyperglycemia 06/23/2018  . Hypertension   . Mild hyperlipidemia 06/23/2018  . Pre-diabetes   . Wears glasses     Patient Active Problem List   Diagnosis Date Noted  . Severe muscle deconditioning  10/06/2018  . Chronic atrial fibrillation ([place])   . Acute renal failure with acute tubular necrosis superimposed on stage 3 chronic kidney disease ([place])   . Metastatic cancer ([place])   . Palliative care encounter   . Acute renal failure ([place])   . Acute respiratory failure with hypoxia ([place])   . Atypical pneumonia   . CKD (chronic kidney disease), stage III ([place]) 09/08/2018  . Mitral valve insufficiency 08/17/2018  . Drug rash   . Essential hypertension 06/23/2018  . Hyperglycemia 06/23/2018  . Mild hyperlipidemia 06/23/2018  . Mucositis due to chemotherapy 06/22/2018  . AKI (acute kidney injury) ([place]) 06/22/2018  . Pancytopenia ([place]) 06/22/2018  . Staphylococcus aureus bacteremia   . Neutropenic fever ([place]) 06/21/2018  . Pneumonia   . Thrombocytopenia ([place])   . Chronic a-fib ([place])   . Abscess of abdominal cavity ([place]) 12/19/2017  . IDA (iron deficiency anemia) 09/08/2017  . 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 Anorexia 10/17/2016  . Diarrhea 10/17/2016  . LFT elevation 10/17/2016  . Dehydration 10/17/2016  . Fatigue 10/17/2016  . Hypocalcemia 10/17/2016  . Goals of care, counseling/discussion 09/30/2016  . 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 ([place]) 09/29/2016  . Cancer of dome of urinary bladder ([place]) 09/20/2016  . Postoperative anemia due to acute blood loss 03/04/2015  . Hypokalemia 03/04/2015  . Primary  osteoarthritis of left hip 03/03/2015    Past Surgical History:  Procedure Laterality Date  . CARDIOVERSION N/A 05/18/2019   Procedure: CARDIOVERSION;  Surgeon: Lewayne Bunting, MD;  Location: [facility] ENDOSCOPY;  Service: Cardiovascular;  Laterality: N/A;  . CARDIOVERSION N/A 09/27/2019   Procedure: CARDIOVERSION;  Surgeon: Little Ishikawa, MD;  Location: [facility] ENDOSCOPY;  Service: Cardiovascular;  Laterality: N/A;  . CATARACT EXTRACTION W/ INTRAOCULAR LENS  IMPLANT, BILATERAL Bilateral 2011  .  CERVICAL SPINE SURGERY  1997  . IR FLUORO GUIDE PORT INSERTION RIGHT  10/07/2016  . IR RADIOLOGIST EVAL & MGMT  01/05/2018  . IR US GUIDE VASC ACCESS RIGHT  10/07/2016  . KNEE ARTHROSCOPY Bilateral right 2006;  left 02-15-2007  . LUMBAR SPINE SURGERY  1999;  1990;  1983  . ROBOT ASSISTED LAPAROSCOPIC RADICAL PROSTATECTOMY  06/20/2006   bilateral nerve sparing  . TEE WITHOUT CARDIOVERSION N/A 06/27/2018   Procedure: TRANSESOPHAGEAL ECHOCARDIOGRAM (TEE);  Surgeon: Elder Negus, MD;  Location: [facility] ENDOSCOPY;  Service: Cardiovascular;  Laterality: N/A;  . TOTAL HIP ARTHROPLASTY Left 03/03/2015   Procedure: LEFT TOTAL HIP ARTHROPLASTY ANTERIOR APPROACH;  Surgeon: Jodi Geralds, MD;  Location: [facility] OR;  Service: Orthopedics;  Laterality: Left;  . TOTAL KNEE ARTHROPLASTY Bilateral left 08-13-2009;  right 12-26-2009  . TRANSURETHRAL RESECTION OF BLADDER TUMOR N/A 09/20/2016   Procedure: TRANSURETHRAL RESECTION OF BLADDER TUMOR (TURBT);  Surgeon: Marcine Matar, MD;  Location: [facility];  Service: Urology;  Laterality: N/A;  . TRANSURETHRAL RESECTION OF BLADDER TUMOR WITH MITOMYCIN-C N/A 08/06/2019   Procedure: TRANSURETHRAL RESECTION OF BLADDER TUMOR;  Surgeon: Marcine Matar, MD;  Location: [facility];  Service: Urology;  Laterality: N/A;       Family History  Problem Relation Age of Onset  . Hypertension Mother   . Aneurysm Mother   . Emphysema Father   . Hypertension Father     Social History   Tobacco Use  . Smoking status: Former Smoker    Packs/day: 1.00    Years: 16.00    Pack years: 16.00    Types: Cigarettes    Quit date: 09/25/1984    Years since quitting: 35.3  . Smokeless tobacco: Never Used  Vaping Use  . Vaping Use: Never used  Substance Use Topics  . Alcohol use: Yes    Comment: occasionally  . Drug use: Yes    Types: Marijuana    Comment: after chemo to help sleep    Home Medications Prior to Admission medications     Medication Sig Start Date End Date Taking? Authorizing Provider  acetaminophen (TYLENOL) 500 MG tablet Take 1,000 mg by mouth every 6 (six) hours as needed for mild pain or headache.    [provider]  amoxicillin (AMOXIL) 500 MG capsule TAKE 4 CAPS (2,000 MG TOTAL) BY MOUTH ONCE FOR 1 DOSE. TAKE 1 HOUR PRIOR TO DENTAL PROCEDURE 08/31/19   Josph Macho, MD  apixaban (ELIQUIS) 5 MG TABS tablet Take 1 tablet (5 mg total) by mouth 2 (two) times daily. 03/19/19   Josph Macho, MD  atorvastatin (LIPITOR) 40 MG tablet Take 40 mg by mouth every evening.     [provider]  Cholecalciferol (VITAMIN D-3 PO) Take 1 capsule by mouth daily with breakfast.    [provider]  cyclobenzaprine (FEXMID) 7.5 MG tablet Take 1 tablet (7.5 mg total) by mouth 3 (three) times daily as needed for muscle spasms. Patient not taking: Reported on 01/28/2020 12/31/19  Volanda Napoleon, MD  dexamethasone (DECADRON) 4 MG tablet Take 2 tablets (8 mg) daily for 3 days after chemotherapy. Take with food. 12/31/19   Volanda Napoleon, MD  diltiazem (CARDIZEM CD) 360 MG 24 hr capsule TAKE 1 CAPSULE BY MOUTH EVERY DAY Patient taking differently: Take 360 mg by mouth daily.  09/21/19   Hilty, Nadean Corwin, MD  dofetilide (TIKOSYN) 250 MCG capsule Take 1 capsule (250 mcg total) by mouth 2 (two) times daily. 09/28/19   Shirley Friar, PA-C  DULoxetine (CYMBALTA) 60 MG capsule TAKE ONE CAPSULE BY MOUTH DAILY 01/21/20   Volanda Napoleon, MD  gabapentin (NEURONTIN) 400 MG capsule TAKE 1 CAPSULE (400 MG TOTAL) BY MOUTH 4 (FOUR) TIMES DAILY. Patient taking differently: Take 800 mg by mouth daily.  09/11/19   Volanda Napoleon, MD  K Phos Mono-Sod Phos Di & Mono (K-PHOS-NEUTRAL) [phone] MG TABS Take 1 tablet by mouth 2 (two) times daily. Patient not taking: Reported on 01/28/2020 11/22/19   [provider]  lidocaine-prilocaine (EMLA) cream Apply to affected area once 12/31/19   Volanda Napoleon,  MD  loperamide (IMODIUM A-D) 2 MG tablet Take 2 at diarrhea onset, then 1 every 2hr until 12hrs with no BM. May take 2 every 4hrs at night. If diarrhea recurs repeat. 12/31/19   Volanda Napoleon, MD  LORazepam (ATIVAN) 0.5 MG tablet Take 1 tablet (0.5 mg total) by mouth every 6 (six) hours as needed (Nausea or vomiting). 12/31/19   Volanda Napoleon, MD  magnesium oxide (MAG-OX) 400 MG tablet Take 1 tablet (400 mg total) by mouth 2 (two) times daily. 10/05/19   Sherran Needs, NP  metoprolol succinate (TOPROL-XL) 50 MG 24 hr tablet TAKE ONE TABLET BY MOUTH EVERY MORNING AND TAKE ONE TABLET BY MOUTH EVERY NIGHT AT BEDTIME 01/01/20   Sherran Needs, NP  oxybutynin (DITROPAN-XL) 10 MG 24 hr tablet Take 10 mg by mouth at bedtime.    [provider]  potassium chloride (KLOR-CON) 10 MEQ tablet Take 2 tablets (20 mEq total) by mouth daily. 12/31/19 03/30/20  Baldwin [place], PA-C  prochlorperazine (COMPAZINE) 10 MG tablet Take 1 tablet (10 mg total) by mouth every 6 (six) hours as needed (Nausea or vomiting). Patient taking differently: Take 10 mg by mouth as needed (Nausea or vomiting).  12/31/19   Volanda Napoleon, MD  pyridoxine (B-6) 100 MG tablet Take 200 mg by mouth daily with breakfast.     [provider]  traMADol (ULTRAM) 50 MG tablet Take 50 mg by mouth daily as needed (for pain).  03/16/19   [provider]    Allergies    Patient has no known allergies.  Review of Systems   Review of Systems  Constitutional: Positive for appetite change. Negative for fever.  HENT: Negative for rhinorrhea and sore throat.   Eyes: Negative for redness.  Respiratory: Negative for cough.   Cardiovascular: Negative for chest pain.  Gastrointestinal: Positive for diarrhea. Negative for abdominal pain, nausea and vomiting.  Genitourinary: Negative for dysuria and hematuria.  Musculoskeletal: Negative for myalgias.  Skin: Positive for wound. Negative for rash.  Neurological:  Positive for weakness (Generalized). Negative for headaches.    Physical Exam Updated Vital Signs BP 99/70 (BP Location: Right Arm)   Pulse 95   Temp 99.2 F (37.3 C) (Oral)   Resp 16   SpO2 100% Comment: Simultaneous filing. User may not have seen previous data.  Physical Exam Vitals and  nursing note reviewed.  Constitutional:      Appearance: He is well-developed.  HENT:     Head: Normocephalic and atraumatic.  Eyes:     General:        Right eye: No discharge.        Left eye: No discharge.     Conjunctiva/sclera: Conjunctivae normal.  Cardiovascular:     Rate and Rhythm: Normal rate and regular rhythm.     Heart sounds: Normal heart sounds.  Pulmonary:     Effort: Pulmonary effort is normal.     Breath sounds: Normal breath sounds.  Abdominal:     Palpations: Abdomen is soft.     Tenderness: There is no abdominal tenderness. There is no guarding or rebound.  Musculoskeletal:     Cervical back: Normal range of motion and neck supple.     Right lower leg: No edema.     Left lower leg: No edema.  Skin:    General: Skin is warm and dry.     Comments: Patient has a older appearing area of ecchymosis to the left shin and a acute appearing area of abrasion and ecchymosis to the right shin.  Patient has some abrasions to the right lateral chest wall.  Neurological:     Mental Status: He is alert.     ED Results / Procedures / Treatments   Labs (all labs ordered are listed, but only abnormal results are displayed) Labs Reviewed  CBC WITH DIFFERENTIAL/PLATELET - Abnormal; Notable for the following components:      Result Value   WBC 0.2 (*)    RBC 2.54 (*)    Hemoglobin 8.6 (*)    HCT 26.0 (*)    MCV 102.4 (*)    Platelets 30 (*)    Neutro Abs 0.0 (*)    Lymphs Abs 0.1 (*)    Monocytes Absolute 0.0 (*)    All other components within normal limits  COMPREHENSIVE METABOLIC PANEL - Abnormal; Notable for the following components:   Sodium 130 (*)    CO2 21 (*)     Glucose, Bld 202 (*)    BUN 43 (*)    Creatinine, Ser 2.55 (*)    Calcium 8.0 (*)    Total Protein 5.4 (*)    Albumin 3.0 (*)    GFR, Estimated 27 (*)    All other components within normal limits  MAGNESIUM - Abnormal; Notable for the following components:   Magnesium 1.5 (*)    All other components within normal limits  RESPIRATORY PANEL BY RT PCR (FLU A&B, COVID)  URINALYSIS, ROUTINE W REFLEX MICROSCOPIC    EKG None  Radiology No results found.  Procedures Procedures (including critical care time)  Medications Ordered in ED Medications  magnesium oxide (MAG-OX) tablet 400 mg (has no administration in time range)  sodium chloride 0.9 % bolus 1,000 mL (1,000 mLs Intravenous New Bag/Given 02/03/20 1127)    ED Course  I have reviewed the triage vital signs and the nursing notes.  Pertinent labs & imaging results that were available during my care of the patient were reviewed by me and considered in my medical decision making (see chart for details).  Patient seen and examined. Work-up initiated.  Will check labs and hydrate.  Vital signs reviewed and are as follows: BP 99/70 (BP Location: Right Arm)   Pulse 95   Temp 99.2 F (37.3 C) (Oral)   Resp 16   SpO2 100% Comment: Simultaneous filing. User may  not have seen previous data.  Patient is neutropenic, WBC 0.2, Neutrophils 0.0.  I discussed with with Dr. Alen Blew.  Agrees with monitoring of white blood cell count, neutropenic precautions.  No indication for broad-spectrum IV antibiotics or cultures unless patient develops a fever.  I discussed findings with patient and wife.  Magnesium is a little low and I will replete.  CKD but close to baseline. Feel that patient would benefit from admission for monitoring, IV hydration given deconditioning due to chemotherapy and recent diarrhea.  Will discuss with hospitalist service.  Patient discussed with Dr. Eulis Foster.  12:21 PM Spoke with Dr. Neysa Bonito who will see.     MDM  Rules/Calculators/A&P                          Admit for dehydration, deconditioning in setting of chemotherapy-induced diarrhea and poor oral intake leading to multiple falls at home.  Patient is also neutropenic without fever.    Final Clinical Impression(s) / ED Diagnoses Final diagnoses:  Chemotherapy-induced neutropenia ([place])  Hypomagnesemia  Weakness  Multiple falls  Diarrhea, unspecified type    Rx / DC Orders ED Discharge Orders    None       Carlisle Cater, PA-C 02/03/20 1222    Daleen Bo, MD 02/03/20 1332

## 2020-02-03 NOTE — ED Provider Notes (Signed)
  Face-to-face evaluation   History: Patient here for evaluation of general weakness.  He fell twice at home without significant injury per his report.  He is being treated with chemotherapy for bladder cancer and is on a new medication for the last 4 treatments.  Physical exam: Elderly, alert and cooperative.  He is no dysarthria or aphasia.  No respiratory distress.  Medical screening examination/treatment/procedure(s) were conducted as a shared visit with non-physician practitioner(s) and myself.  I personally evaluated the patient during the encounter    Daleen Bo, MD 02/03/20 1332

## 2020-02-03 NOTE — ED Notes (Signed)
CRITICAL VALUE STICKER  CRITICAL VALUE: WBC 0.2  RECEIVER (on-site recipient of call): Labrina Lines, Jay NOTIFIED: 02/03/20 11:51  MESSENGER (representative from lab): Lab Tech  MD NOTIFIED: Alecia Lemming, PA  TIME OF NOTIFICATION: 11:55  RESPONSE: PA verbalized understanding of critical lab value and will consult pt's oncology provider.

## 2020-02-03 NOTE — H&P (Addendum)
History and Physical        Hospital Admission Note Date: 02/03/2020  Patient name: [name] Rose Medical record number: [id number] Date of birth: 09-Dec-1950 Age: 69 y.o. Gender: male  PCP: Shirline Frees, MD  Patient coming from: Home Lives with: Wife At baseline, ambulates: Independently  Chief Complaint    Chief Complaint  Patient presents with  . Weakness      HPI:   This is a 69 year old male with past medical history of metastatic bladder cancer and currently undergoing chemotherapy (currently on Trodelvy, last treatment 6 days ago), atrial fibrillation, hypertension, CKD 4 who presented to the ED with complaints of generalized weakness and diarrhea for the past 4 to 5 days.  Has had decreased appetite without vomiting.  No abdominal pain, chest pain, shortness of breath.  No fevers.  Has been having falls with transferring as well with subsequent abrasions to his legs and his right flank.  No head injuries.  Patient was apparently hypotensive en route per EMS and was given about 200 cc of NS.  Currently, he states he feels a bit better and has had improvement in his diarrhea since having Imodium.  Initially he fell around 7 AM today and contacted EMS but decided not to come to the ED.  After he fell a second time he felt it was best for him to get evaluated.  States that he fell due to imbalance and not from loss of consciousness.  ED Course: Afebrile and hemodynamically stable on room air with borderline low BP (99/70).  Notable labs: Na 130, glucose 202, BUN 43, creatinine 2.55 (at baseline) magnesium 1.5 WBC 0.21 (previously 5.5 on 10/18), Hb 8.6 (previously 10.8 on 10/18), platelets 30 (previously 8510/18).  ED PA discussed findings with Dr. Alen Blew, oncology, who agreed with monitoring WBC and continue with neutropenic precautions but no indication for antibiotics  without fevers.  He was given Mag-Ox 400 mg p.o. x1 and 1 L NS bolus.  Vitals:   02/03/20 1348 02/03/20 1416  BP: 95/71 101/64  Pulse: 62 65  Resp: 17 18  Temp: 99 F (37.2 C) 98.4 F (36.9 C)  SpO2: 97% 100%     Review of Systems:  Review of Systems  Constitutional: Negative for chills and fever.  Respiratory: Negative for cough and shortness of breath.   Cardiovascular: Negative for chest pain and palpitations.  Gastrointestinal: Negative for nausea and vomiting.  Genitourinary: Negative.   Musculoskeletal: Positive for falls.  All other systems reviewed and are negative.   Medical/Social/Family History   Past Medical History: Past Medical History:  Diagnosis Date  . Anemia   . Arthritis   . 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 ([place]) 09/29/2016  . Bladder tumor   . Chronic kidney disease    ckd stage 3, sees dr every 6 months, arf hemodialysis done x 2 2020  . Dyspnea    with exertion  . Dysrhythmia    atrial fib  . Essential hypertension 06/23/2018  . Goals of care, counseling/discussion 09/30/2016  . History of prostate cancer followed by pcp dr [name]-  per pt last PSA undetectable   dx 2008-- (Stage T1c, Gleason 3+3,  PSA 4.58, vol 99cc)  s/p  radical prostatectomy (nerve sparing bilateral)   . Hyperglycemia 06/23/2018  . Hypertension   . Mild hyperlipidemia 06/23/2018  . Pre-diabetes   . Wears glasses     Past Surgical History:  Procedure Laterality Date  . CARDIOVERSION N/A 05/18/2019   Procedure: CARDIOVERSION;  Surgeon: Lelon Perla, MD;  Location: [facility] ENDOSCOPY;  Service: Cardiovascular;  Laterality: N/A;  . CARDIOVERSION N/A 09/27/2019   Procedure: CARDIOVERSION;  Surgeon: Donato Heinz, MD;  Location: [facility];  Service: Cardiovascular;  Laterality: N/A;  . CATARACT EXTRACTION W/ INTRAOCULAR LENS  IMPLANT, BILATERAL Bilateral 2011  . [place]  . IR FLUORO GUIDE PORT INSERTION RIGHT  10/07/2016    . IR RADIOLOGIST EVAL & MGMT  01/05/2018  . IR US GUIDE VASC ACCESS RIGHT  10/07/2016  . KNEE ARTHROSCOPY Bilateral right 2006;  left 02-15-2007  . [place];  1990;  1983  . ROBOT ASSISTED LAPAROSCOPIC RADICAL PROSTATECTOMY  06/20/2006   bilateral nerve sparing  . TEE WITHOUT CARDIOVERSION N/A 06/27/2018   Procedure: TRANSESOPHAGEAL ECHOCARDIOGRAM (TEE);  Surgeon: Nigel Mormon, MD;  Location: [facility] ENDOSCOPY;  Service: Cardiovascular;  Laterality: N/A;  . TOTAL HIP ARTHROPLASTY Left 03/03/2015   Procedure: LEFT TOTAL HIP ARTHROPLASTY ANTERIOR APPROACH;  Surgeon: Dorna Leitz, MD;  Location: [facility];  Service: Orthopedics;  Laterality: Left;  . TOTAL KNEE ARTHROPLASTY Bilateral left 08-13-2009;  right 12-26-2009  . TRANSURETHRAL RESECTION OF BLADDER TUMOR N/A 09/20/2016   Procedure: TRANSURETHRAL RESECTION OF BLADDER TUMOR (TURBT);  Surgeon: Franchot Gallo, MD;  Location: [facility];  Service: Urology;  Laterality: N/A;  . TRANSURETHRAL RESECTION OF BLADDER TUMOR WITH MITOMYCIN-C N/A 08/06/2019   Procedure: TRANSURETHRAL RESECTION OF BLADDER TUMOR;  Surgeon: Franchot Gallo, MD;  Location: [facility];  Service: Urology;  Laterality: N/A;    Medications: Prior to Admission medications   Medication Sig Start Date End Date Taking? Authorizing Provider  acetaminophen (TYLENOL) 500 MG tablet Take 1,000 mg by mouth every 6 (six) hours as needed for mild pain or headache.    [provider]  amoxicillin (AMOXIL) 500 MG capsule TAKE 4 CAPS (2,000 MG TOTAL) BY MOUTH ONCE FOR 1 DOSE. TAKE 1 HOUR PRIOR TO DENTAL PROCEDURE 08/31/19   Volanda Napoleon, MD  apixaban (ELIQUIS) 5 MG TABS tablet Take 1 tablet (5 mg total) by mouth 2 (two) times daily. 03/19/19   Volanda Napoleon, MD  atorvastatin (LIPITOR) 40 MG tablet Take 40 mg by mouth every evening.     [provider]  Cholecalciferol (VITAMIN D-3 PO) Take 1 capsule by mouth daily  with breakfast.    [provider]  cyclobenzaprine (FEXMID) 7.5 MG tablet Take 1 tablet (7.5 mg total) by mouth 3 (three) times daily as needed for muscle spasms. Patient not taking: Reported on 01/28/2020 12/31/19   Volanda Napoleon, MD  dexamethasone (DECADRON) 4 MG tablet Take 2 tablets (8 mg) daily for 3 days after chemotherapy. Take with food. 12/31/19   Volanda Napoleon, MD  diltiazem (CARDIZEM CD) 360 MG 24 hr capsule TAKE 1 CAPSULE BY MOUTH EVERY DAY Patient taking differently: Take 360 mg by mouth daily.  09/21/19   Hilty, Nadean Corwin, MD  dofetilide (TIKOSYN) 250 MCG capsule Take 1 capsule (250 mcg total) by mouth 2 (two) times daily. 09/28/19   Shirley Friar, PA-C  DULoxetine (CYMBALTA) 60 MG capsule TAKE ONE CAPSULE BY MOUTH DAILY 01/21/20   Burney Gauze  R, MD  gabapentin (NEURONTIN) 400 MG capsule TAKE 1 CAPSULE (400 MG TOTAL) BY MOUTH 4 (FOUR) TIMES DAILY. Patient taking differently: Take 800 mg by mouth daily.  09/11/19   Volanda Napoleon, MD  K Phos Mono-Sod Phos Di & Mono (K-PHOS-NEUTRAL) [phone] MG TABS Take 1 tablet by mouth 2 (two) times daily. Patient not taking: Reported on 01/28/2020 11/22/19   [provider]  lidocaine-prilocaine (EMLA) cream Apply to affected area once 12/31/19   Volanda Napoleon, MD  loperamide (IMODIUM A-D) 2 MG tablet Take 2 at diarrhea onset, then 1 every 2hr until 12hrs with no BM. May take 2 every 4hrs at night. If diarrhea recurs repeat. 12/31/19   Volanda Napoleon, MD  LORazepam (ATIVAN) 0.5 MG tablet Take 1 tablet (0.5 mg total) by mouth every 6 (six) hours as needed (Nausea or vomiting). 12/31/19   Volanda Napoleon, MD  magnesium oxide (MAG-OX) 400 MG tablet Take 1 tablet (400 mg total) by mouth 2 (two) times daily. 10/05/19   Sherran Needs, NP  metoprolol succinate (TOPROL-XL) 50 MG 24 hr tablet TAKE ONE TABLET BY MOUTH EVERY MORNING AND TAKE ONE TABLET BY MOUTH EVERY NIGHT AT BEDTIME 01/01/20   Sherran Needs, NP   oxybutynin (DITROPAN-XL) 10 MG 24 hr tablet Take 10 mg by mouth at bedtime.    [provider]  potassium chloride (KLOR-CON) 10 MEQ tablet Take 2 tablets (20 mEq total) by mouth daily. 12/31/19 03/30/20  Baldwin Jamaica, PA-C  prochlorperazine (COMPAZINE) 10 MG tablet Take 1 tablet (10 mg total) by mouth every 6 (six) hours as needed (Nausea or vomiting). Patient taking differently: Take 10 mg by mouth as needed (Nausea or vomiting).  12/31/19   Volanda Napoleon, MD  pyridoxine (B-6) 100 MG tablet Take 200 mg by mouth daily with breakfast.     [provider]  traMADol (ULTRAM) 50 MG tablet Take 50 mg by mouth daily as needed (for pain).  03/16/19   [provider]    Allergies:  No Known Allergies  Social History:  reports that he quit smoking about 35 years ago. His smoking use included cigarettes. He has a 16.00 pack-year smoking history. He has never used smokeless tobacco. He reports current alcohol use. He reports current drug use. Drug: Marijuana.  Family History: Family History  Problem Relation Age of Onset  . Hypertension Mother   . Aneurysm Mother   . Emphysema Father   . Hypertension Father      Objective   Physical Exam: Blood pressure 101/64, pulse 65, temperature 98.4 F (36.9 C), temperature source Oral, resp. rate 18, height $RemoveBe'6\' 2"'eYVebhQby$  (1.88 m), weight 121.5 kg, SpO2 100 %.  Physical Exam Vitals and nursing note reviewed.  Constitutional:      Appearance: Normal appearance.  HENT:     Head: Normocephalic and atraumatic.  Eyes:     Conjunctiva/sclera: Conjunctivae normal.  Cardiovascular:     Rate and Rhythm: Normal rate and regular rhythm.  Pulmonary:     Effort: Pulmonary effort is normal.     Breath sounds: Normal breath sounds.  Abdominal:     General: Abdomen is flat.     Palpations: Abdomen is soft.  Musculoskeletal:        General: No swelling or tenderness.     Comments: Laceration with bandage overlying on his back   Skin:    Coloration: Skin is pale. Skin is not jaundiced.  Neurological:  Mental Status: He is alert. Mental status is at baseline.  Psychiatric:        Mood and Affect: Mood normal.        Behavior: Behavior normal.     LABS on Admission: I have personally reviewed all the labs and imaging below    Basic Metabolic Panel: Recent Labs  Lab 01/28/20 1200 01/28/20 1200 02/03/20 1128  NA 136  --  130*  K 3.8  --  4.0  CL 101  --  99  CO2 24  --  21*  GLUCOSE 138*  --  202*  BUN 37*  --  43*  CREATININE 2.46*  --  2.55*  CALCIUM 9.4  --  8.0*  MG 1.6*   < > 1.5*  PHOS 2.9  --   --    < > = values in this interval not displayed.   Liver Function Tests: Recent Labs  Lab 01/28/20 1200 02/03/20 1128  AST 19 25  ALT 25 33  ALKPHOS 107 69  BILITOT 0.5 1.2  PROT 6.5 5.4*  ALBUMIN 3.9 3.0*   No results for input(s): LIPASE, AMYLASE in the last 168 hours. No results for input(s): AMMONIA in the last 168 hours. CBC: Recent Labs  Lab 01/28/20 1200 01/28/20 1200 02/03/20 1128  WBC 5.5  --  0.2*  NEUTROABS 3.8   < > 0.0*  HGB 10.8*  --  8.6*  HCT 32.1*  --  26.0*  MCV 103.5*   < > 102.4*  PLT 85*  --  30*   < > = values in this interval not displayed.   Cardiac Enzymes: No results for input(s): CKTOTAL, CKMB, CKMBINDEX, TROPONINI in the last 168 hours. BNP: Invalid input(s): POCBNP CBG: No results for input(s): GLUCAP in the last 168 hours.  Radiological Exams on Admission:  No results found.    EKG: Not done   A & P   Principal Problem:   Failure to thrive in adult Active Problems:   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 ([facility])   Diarrhea   Chronic a-fib (H[facility])   Pancytopenia ([place])   Fall at home, initial encounter   1. Failure to thrive  Multiple Falls a. Decreased appetite and activity level likely from his recent chemo regimen b. PT eval c. Orthostatic vitals d. IV fluids as needed  2. Diarrhea, suspect secondary  to recent chemo a. Continue Imodium as needed  3. Pancytopenia, suspect secondary to chemo a. WBC 0.2 (ANC), Hb 8.6, platelets 30 (previously 85 on 10/18) b. Afebrile, will hold antibiotics for now c. Hematology/oncology consult  4. Metastatic bladder cancer a. Follows with [name], will consult  5. Atrial fibrillation, rate controlled a. Continue home Tikosyn, Cardizem, Toprol and Eliquis-confirmed with the patient, already took his morning doses today b. Holding eliquis due to thrombocytopenia  6. Hypertension a. Continue home Toprol XL  7. CKD 4, stable   DVT prophylaxis: SCD   Code Status: Full Code  Diet: Heart healthy Family Communication: Admission, patients condition and plan of care including tests being ordered have been discussed with the patient who indicates understanding and agrees with the plan and Code Status. Patient's wife was updated  Disposition Plan: The appropriate patient status for this patient is INPATIENT. Inpatient status is judged to be reasonable and necessary in order to provide the required intensity of service to ensure the patient's safety. The patient's presenting symptoms, physical exam findings, and initial radiographic and laboratory data in the context  of their chronic comorbidities is felt to place them at high risk for further clinical deterioration. Furthermore, it is not anticipated that the patient will be medically stable for discharge from the hospital within 2 midnights of admission. The following factors support the patient status of inpatient.   " The patient's presenting symptoms include Falls, Weakness . " The worrisome physical exam findings include Pale, occasional scrapes. " The initial radiographic and laboratory data are worrisome because of pancytopenia. " The chronic co-morbidities include metastatic bladder cancer.   * I certify that at the point of admission it is my clinical judgment that the patient will require  inpatient hospital care spanning beyond 2 midnights from the point of admission due to high intensity of service, high risk for further deterioration and high frequency of surveillance required.*    Consultants  . Oncology  Procedures  . None  Time Spent on Admission: 65 minutes    Harold Hedge, DO [facility]  02/03/2020, 2:42 PM

## 2020-02-03 NOTE — ED Triage Notes (Signed)
Pt BIB EMS and coming from home.  Pt has been diarrhea for the past 2-3 days.  Pt reports waking up with weakness. Pt uses his wheelchair to get around in the house and is normally able to get out of wheelchair to transfer to bed, recliner, etc. Today, pt has had two falls when transferring due to weakness. Pt was initially hypotensive on EMS arrival and an IV was started and about 252ml of NaCl was transfused.  Pt has a small abrasion to left thoracic back area. Pt a/o x 4.

## 2020-02-04 ENCOUNTER — Encounter (HOSPITAL_COMMUNITY): Payer: Self-pay | Admitting: Internal Medicine

## 2020-02-04 DIAGNOSIS — R627 Adult failure to thrive: Secondary | ICD-10-CM

## 2020-02-04 LAB — BLOOD CULTURE ID PANEL (REFLEXED) - BCID2

## 2020-02-04 LAB — CBC
HCT: 22.3 % — ABNORMAL LOW (ref 39.0–52.0)
Hemoglobin: 7.7 g/dL — ABNORMAL LOW (ref 13.0–17.0)
MCH: 34.5 pg — ABNORMAL HIGH (ref 26.0–34.0)
MCHC: 34.5 g/dL (ref 30.0–36.0)
MCV: 100 fL (ref 80.0–100.0)
Platelets: 15 10*3/uL — CL (ref 150–400)
RBC: 2.23 MIL/uL — ABNORMAL LOW (ref 4.22–5.81)
RDW: 13.7 % (ref 11.5–15.5)
WBC: 0.2 10*3/uL — CL (ref 4.0–10.5)
nRBC: 0 % (ref 0.0–0.2)

## 2020-02-04 LAB — BASIC METABOLIC PANEL
Anion gap: 11 (ref 5–15)
BUN: 50 mg/dL — ABNORMAL HIGH (ref 8–23)
CO2: 20 mmol/L — ABNORMAL LOW (ref 22–32)
Calcium: 8.4 mg/dL — ABNORMAL LOW (ref 8.9–10.3)
Chloride: 99 mmol/L (ref 98–111)
Creatinine, Ser: 3.49 mg/dL — ABNORMAL HIGH (ref 0.61–1.24)
GFR, Estimated: 18 mL/min — ABNORMAL LOW (ref >60)
Glucose, Bld: 186 mg/dL — ABNORMAL HIGH (ref 70–99)
Potassium: 4.1 mmol/L (ref 3.5–5.1)
Sodium: 130 mmol/L — ABNORMAL LOW (ref 135–145)

## 2020-02-04 LAB — C DIFFICILE QUICK SCREEN W PCR REFLEX
C Diff antigen: NEGATIVE
C Diff interpretation: NOT DETECTED
C Diff toxin: NEGATIVE

## 2020-02-04 LAB — PREPARE RBC (CROSSMATCH)

## 2020-02-04 MED ORDER — SODIUM CHLORIDE 0.9 % IV SOLN: 2.0000 g | INTRAVENOUS | Status: DC

## 2020-02-04 MED ORDER — FUROSEMIDE 10 MG/ML IJ SOLN: 40.0000 mg | Freq: Once | INTRAMUSCULAR | Status: DC

## 2020-02-04 MED ORDER — CHLORHEXIDINE GLUCONATE CLOTH 2 % EX PADS
6.0000 | MEDICATED_PAD | Freq: Every day | CUTANEOUS | Status: DC
  Administered 2020-02-04 – 2020-02-24 (×20): 6 via TOPICAL

## 2020-02-04 MED ORDER — FUROSEMIDE 10 MG/ML IJ SOLN: 20.0000 mg | Freq: Once | INTRAMUSCULAR | Status: DC

## 2020-02-04 MED ORDER — SODIUM CHLORIDE 0.9 % IV SOLN
2.0000 g | INTRAVENOUS | Status: DC
  Administered 2020-02-04 – 2020-02-05 (×2): 2 g via INTRAVENOUS
  Filled 2020-02-04 (×2): qty 2
  Filled 2020-02-04: qty 20

## 2020-02-04 MED ORDER — TBO-FILGRASTIM 480 MCG/0.8ML ~~LOC~~ SOSY
480.0000 ug | PREFILLED_SYRINGE | Freq: Every day | SUBCUTANEOUS | Status: DC
  Administered 2020-02-04 – 2020-02-07 (×4): 480 ug via SUBCUTANEOUS
  Filled 2020-02-04 (×5): qty 0.8

## 2020-02-04 MED ORDER — SODIUM CHLORIDE 0.9 % IV SOLN: INTRAVENOUS | Status: DC

## 2020-02-04 MED ORDER — SODIUM CHLORIDE 0.9% IV SOLUTION: Freq: Once | INTRAVENOUS | Status: DC

## 2020-02-04 NOTE — Progress Notes (Addendum)
PROGRESS NOTE    [name] Rose  FFM:[id number] DOB: 1951-01-22 DOA: 02/03/2020 PCP: Shirline Frees, MD   Chief Complain: Weakness  Brief Narrative: Patient is a 69 year old male with history of metastatic bladder cancer, currently on chemotherapy, paroxysmal A. fib, hypertension, CKD stage IV who presents to the emergency department complaints of generalized weakness and diarrhea for last 4 to 5 days.  He complains of decreased appetite but no abdominal pain, fevers or chills.  He has been falling frequently at home.  When EMS saw him, he was hypotensive.  On presentation he was hypotensive.  Lab work showed severe leukopenia, thrombocytopenia.  Patient was admitted for the management of sepsis secondary to neutropenia and started on cefepime.  Oncology consulted.  Blood cultures now showing Klebsiella pneumonia.  Assessment & Plan:   Principal Problem:   Failure to thrive in adult Active Problems:   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 (HCC)   Diarrhea   Chronic a-fib (HCC)   Pancytopenia (HCC)   Fall at home, initial encounter   Pancytopenia: WBC 0.2, platelets of 30 on presentation.  Pancytopenia secondary to chemotherapy.  Oncology consulted and following.  Continue to monitor CBC.  Being transfused with 2 units of PRBC today. He is also being given Granix.  Gram-negative bacteremia/sepsis: Blood cultures now showing Klebsiella pneumonia.  Continue ceftriaxone.  Follow final culture.  Blood pressure low.  Continue IV fluids.  AKI on CKD stage IV: Creatinine bumped up to the range of 3 today.  Started on gentle IV fluids.  He was complaining of severe diarrhea from chemotherapy and it has been going on for several days.  He is volume depleted.  We will check urine sodium, creatinine.  He follows with nephrology  Failure to thrive/multiple falls: Decreased appetite, falling frequently at home.    PT consulted.   Will continue gentle IV fluids.  Metastatic  bladder cancer: Follows with oncology, [name].  On chemotherapy.  He also underwent tumor resection by urology in the past.  Permanent  A. fib: Currently rate is controlled.  On Tikosyn, Cardizem, Toprol at home which have been held due to hypotension.  On Eliquis for anticoagulation. Eliquis on hold due to worsening thrombocytopenia.  Hypertension: Currently hypotensive.  Antihypertensives on hold.           DVT prophylaxis:SCD Code Status: Full Family Communication: wife at bedside Status is: Inpatient  Remains inpatient appropriate because:Inpatient level of care appropriate due to severity of illness   Dispo: The patient is from: Home              Anticipated d/c is to: Home              Anticipated d/c date is: 2 days              Patient currently is not medically stable to d/c.     Consultants: oncology  Procedures:None  Antimicrobials:  Anti-infectives (From admission, onward)   Start     Dose/Rate Route Frequency Ordered Stop   02/05/20 0600  ceFEPIme (MAXIPIME) 2 g in sodium chloride 0.9 % 100 mL IVPB        2 g 200 mL/hr over 30 Minutes Intravenous Every 24 hours 02/04/20 0719     02/03/20 1830  ceFEPIme (MAXIPIME) 2 g in sodium chloride 0.9 % 100 mL IVPB  Status:  Discontinued        2 g 200 mL/hr over 30 Minutes Intravenous Every 12 hours 02/03/20 1825 02/04/20  0719      Subjective: Patient seen and examined at the bedside this morning.  Looks comfortable during my evaluation but complains of weakness.Complaints of severe diarrhea  Objective: Vitals:   02/03/20 2025 02/04/20 0100 02/04/20 0239 02/04/20 0634  BP: 121/84 (!) 95/53 (!) 101/56 (!) 94/56  Pulse: 94 70 72 95  Resp: 20 20 (!) 21 20  Temp: 99 F (37.2 C) 98.1 F (36.7 C) 98 F (36.7 C) 98 F (36.7 C)  TempSrc: Oral   Oral  SpO2: 95% 94% 92% 97%  Weight:      Height:        Intake/Output Summary (Last 24 hours) at 02/04/2020 1610 Last data filed at 02/04/2020 0423 Gross per  24 hour  Intake 100 ml  Output 300 ml  Net -200 ml   Filed Weights   02/03/20 1058 02/03/20 1416  Weight: 122.5 kg 121.5 kg    Examination:  General exam: Not in distress,obese HEENT:PERRL,Oral mucosa moist, Ear/Nose normal on gross exam Respiratory system: Bilateral equal air entry, normal vesicular breath sounds, no wheezes or crackles  Cardiovascular system: S1 & S2 heard, RRR. No JVD, murmurs, rubs, gallops or clicks. No pedal edema. Gastrointestinal system: Abdomen is nondistended, soft and nontender. No organomegaly or masses felt. Normal bowel sounds heard. Central nervous system: Alert and oriented. No focal neurological deficits. Extremities: No edema, no clubbing ,no cyanosis, distal peripheral pulses palpable. Skin: No rashes, lesions or ulcers,no icterus ,no pallor   Data Reviewed: I have personally reviewed following labs and imaging studies  CBC: Recent Labs  Lab 01/28/20 1200 02/03/20 1128 02/04/20 0539  WBC 5.5 0.2* 0.2*  NEUTROABS 3.8 0.0*  --   HGB 10.8* 8.6* 7.7*  HCT 32.1* 26.0* 22.3*  MCV 103.5* 102.4* 100.0  PLT 85* 30* 15*   Basic Metabolic Panel: Recent Labs  Lab 01/28/20 1109 01/28/20 1200 02/03/20 1128 02/04/20 0539  NA 142 136 130* 130*  K 4.6 3.8 4.0 4.1  CL 105 101 99 99  CO2 28 24 21* 20*  GLUCOSE 196* 138* 202* 186*  BUN 34* 37* 43* 50*  CREATININE 2.53* 2.46* 2.55* 3.49*  CALCIUM 9.3 9.4 8.0* 8.4*  MG 1.8 1.6* 1.5*  --   PHOS  --  2.9  --   --    GFR: Estimated Creatinine Clearance: 28.1 mL/min (A) (by C-G formula based on SCr of 3.49 mg/dL (H)). Liver Function Tests: Recent Labs  Lab 01/28/20 1200 02/03/20 1128  AST 19 25  ALT 25 33  ALKPHOS 107 69  BILITOT 0.5 1.2  PROT 6.5 5.4*  ALBUMIN 3.9 3.0*   No results for input(s): LIPASE, AMYLASE in the last 168 hours. No results for input(s): AMMONIA in the last 168 hours. Coagulation Profile: No results for input(s): INR, PROTIME in the last 168 hours. Cardiac  Enzymes: No results for input(s): CKTOTAL, CKMB, CKMBINDEX, TROPONINI in the last 168 hours. BNP (last 3 results) No results for input(s): PROBNP in the last 8760 hours. HbA1C: No results for input(s): HGBA1C in the last 72 hours. CBG: No results for input(s): GLUCAP in the last 168 hours. Lipid Profile: No results for input(s): CHOL, HDL, LDLCALC, TRIG, CHOLHDL, LDLDIRECT in the last 72 hours. Thyroid Function Tests: No results for input(s): TSH, T4TOTAL, FREET4, T3FREE, THYROIDAB in the last 72 hours. Anemia Panel: No results for input(s): VITAMINB12, FOLATE, FERRITIN, TIBC, IRON, RETICCTPCT in the last 72 hours. Sepsis Labs: No results for input(s): PROCALCITON, LATICACIDVEN in the last 168  hours.  Recent Results (from the past 240 hour(s))  Respiratory Panel by RT PCR (Flu A&B, Covid) - Nasopharyngeal Swab     Status: None   Collection Time: 02/03/20 11:28 AM   Specimen: Nasopharyngeal Swab  Result Value Ref Range Status   SARS Coronavirus 2 by RT PCR NEGATIVE NEGATIVE Final    Comment: (NOTE) SARS-CoV-2 target nucleic acids are NOT DETECTED.  The SARS-CoV-2 RNA is generally detectable in upper respiratoy specimens during the acute phase of infection. The lowest concentration of SARS-CoV-2 viral copies this assay can detect is 131 copies/mL. A negative result does not preclude SARS-Cov-2 infection and should not be used as the sole basis for treatment or other patient management decisions. A negative result may occur with  improper specimen collection/handling, submission of specimen other than nasopharyngeal swab, presence of viral mutation(s) within the areas targeted by this assay, and inadequate number of viral copies (<131 copies/mL). A negative result must be combined with clinical observations, patient history, and epidemiological information. The expected result is Negative.  Fact Sheet for Patients:  PinkCheek.be  Fact Sheet for  Healthcare Providers:  GravelBags.it  This test is no t yet approved or cleared by the [place] FDA and  has been authorized for detection and/or diagnosis of SARS-CoV-2 by FDA under an Emergency Use Authorization (EUA). This EUA will remain  in effect (meaning this test can be used) for the duration of the COVID-19 declaration under Section 564(b)(1) of the Act, 21 U.S.C. section 360bbb-3(b)(1), unless the authorization is terminated or revoked sooner.     Influenza A by PCR NEGATIVE NEGATIVE Final   Influenza B by PCR NEGATIVE NEGATIVE Final    Comment: (NOTE) The Xpert Xpress SARS-CoV-2/FLU/RSV assay is intended as an aid in  the diagnosis of influenza from Nasopharyngeal swab specimens and  should not be used as a sole basis for treatment. Nasal washings and  aspirates are unacceptable for Xpert Xpress SARS-CoV-2/FLU/RSV  testing.  Fact Sheet for Patients: PinkCheek.be  Fact Sheet for Healthcare Providers: GravelBags.it  This test is not yet approved or cleared by the [place] FDA and  has been authorized for detection and/or diagnosis of SARS-CoV-2 by  FDA under an Emergency Use Authorization (EUA). This EUA will remain  in effect (meaning this test can be used) for the duration of the  Covid-19 declaration under Section 564(b)(1) of the Act, 21  U.S.C. section 360bbb-3(b)(1), unless the authorization is  terminated or revoked. Performed at [facility], [place] [address] [place], [place] [address]   Culture, blood (x 2)     Status: None (Preliminary result)   Collection Time: 02/03/20  6:50 PM   Specimen: BLOOD  Result Value Ref Range Status   Specimen Description   Final    BLOOD RIGHT ANTECUBITAL Performed at [facility], [place] [address], [place], [place] [address]    Special Requests   Final    BOTTLES DRAWN AEROBIC ONLY Blood Culture results may  not be optimal due to an excessive volume of blood received in culture bottles Performed at [facility] [place] [address] [place], [place] [address]    Culture PENDING  Incomplete   Report Status PENDING  Incomplete         Radiology Studies: No results found.      Scheduled Meds: . atorvastatin  40 mg Oral QPM  . dofetilide  250 mcg Oral BID  . DULoxetine  60 mg Oral Daily  .  gabapentin  800 mg Oral QHS  . magnesium oxide  400 mg Oral BID  . metoprolol succinate  50 mg Oral BID  . oxybutynin  10 mg Oral QHS  . potassium chloride SA  20 mEq Oral Daily  . pyridOXINE  200 mg Oral Q breakfast   Continuous Infusions: . [START ON 02/05/2020] ceFEPime (MAXIPIME) IV       LOS: 1 day    Time spent: 35 mins.,More than 50% of that time was spent in counseling and/or coordination of care.      Shelly Coss, MD [facility] P10/25/2021, 8:33 AM

## 2020-02-04 NOTE — Progress Notes (Signed)
PT Cancellation Note  Patient Details Name: [name] Rose MRN: [id number] DOB: [birth year]   Cancelled Treatment:    Reason Eval/Treat Not Completed: Medical issues which prohibited therapy. Pt with low BP 94/56 in combination with decr platelet count down to 15, defer PT at this time.   [facility] 02/04/2020, 9:49 AM

## 2020-02-04 NOTE — Progress Notes (Signed)
PHARMACY - PHYSICIAN COMMUNICATION CRITICAL VALUE ALERT - BLOOD CULTURE IDENTIFICATION (BCID)  [name] Rose is an 69 y.o. male who presented to [facility] on 02/03/2020 with a chief complaint of pancytopenia  Assessment:  GNR in 2/2, K pneumoniae per BCID with no resistance detected. Could potentially narrow to ceftriaxone per algorithm.   Name of physician (or Provider) Contacted: Adhikari  Current antibiotics: cefepime  Changes to prescribed antibiotics recommended:  Narrow to ceftriaxone 2 g iv q 24 hours  Results for orders placed or performed during the hospital encounter of 02/03/20  Blood Culture ID Panel (Reflexed) (Collected: 02/03/2020  6:50 PM)  Result Value Ref Range   Enterococcus faecalis NOT DETECTED NOT DETECTED   Enterococcus Faecium NOT DETECTED NOT DETECTED   Listeria monocytogenes NOT DETECTED NOT DETECTED   Staphylococcus species NOT DETECTED NOT DETECTED   Staphylococcus aureus (BCID) NOT DETECTED NOT DETECTED   Staphylococcus epidermidis NOT DETECTED NOT DETECTED   Staphylococcus lugdunensis NOT DETECTED NOT DETECTED   Streptococcus species NOT DETECTED NOT DETECTED   Streptococcus agalactiae NOT DETECTED NOT DETECTED   Streptococcus pneumoniae NOT DETECTED NOT DETECTED   Streptococcus pyogenes NOT DETECTED NOT DETECTED   A.calcoaceticus-baumannii NOT DETECTED NOT DETECTED   Bacteroides fragilis NOT DETECTED NOT DETECTED   Enterobacterales DETECTED (A) NOT DETECTED   Enterobacter cloacae complex NOT DETECTED NOT DETECTED   Escherichia coli NOT DETECTED NOT DETECTED   Klebsiella aerogenes NOT DETECTED NOT DETECTED   Klebsiella oxytoca NOT DETECTED NOT DETECTED   Klebsiella pneumoniae DETECTED (A) NOT DETECTED   Proteus species NOT DETECTED NOT DETECTED   Salmonella species NOT DETECTED NOT DETECTED   Serratia marcescens NOT DETECTED NOT DETECTED   Haemophilus influenzae NOT DETECTED NOT DETECTED   Neisseria meningitidis NOT DETECTED NOT DETECTED    Pseudomonas aeruginosa NOT DETECTED NOT DETECTED   Stenotrophomonas maltophilia NOT DETECTED NOT DETECTED   Candida albicans NOT DETECTED NOT DETECTED   Candida auris NOT DETECTED NOT DETECTED   Candida glabrata NOT DETECTED NOT DETECTED   Candida krusei NOT DETECTED NOT DETECTED   Candida parapsilosis NOT DETECTED NOT DETECTED   Candida tropicalis NOT DETECTED NOT DETECTED   Cryptococcus neoformans/gattii NOT DETECTED NOT DETECTED   CTX-M ESBL NOT DETECTED NOT DETECTED   Carbapenem resistance IMP NOT DETECTED NOT DETECTED   Carbapenem resistance KPC NOT DETECTED NOT DETECTED   Carbapenem resistance NDM NOT DETECTED NOT DETECTED   Carbapenem resist OXA 48 LIKE NOT DETECTED NOT DETECTED   Carbapenem resistance VIM NOT DETECTED NOT DETECTED    Napoleon Form 02/04/2020  11:34 AM

## 2020-02-04 NOTE — Consult Note (Addendum)
[place]  [phone] [phone] [place]  Reason for Consultation: Pancytopenia, metastatic high-grade bladder cancer  HPI: [name] Rose is a 69 year old male with a past medical history significant for metastatic bladder cancer, atrial fibrillation, hypertension, stage IV CKD.  He presented to the emergency room with generalized weakness and diarrhea for the past 4 to 5 days.  He also reported anorexia.  Additionally, he has been having falls with transferring.  Work-up in the ER significant for a WBC of 0.2, hemoglobin 8.6, platelets 30,000, sodium 130, BUN 43, creatinine 2.55.  Antibiotics were not initially started because he was afebrile.  He takes Eliquis at home and this was placed on hold secondary to thrombocytopenia.  The patient has been receiving systemic treatment with Ivette Loyal.  He received day 8 of cycle #2 on 01/28/2020.  Chemotherapy has been dose reduced secondary to pancytopenia.  The patient reports fatigue and generalized weakness.  He had a fever with chills overnight.  Blood cultures have been drawn and are pending.  He has been started on IV antibiotics.  He reports ongoing diarrhea-cannot quantify the amount.  Denies nausea, vomiting, abdominal pain.  He reports that he has not really hungry.  He denies chest pain shortness of breath.  Denies bleeding.  Nursing reports that QTc interval is prolonged and Tikosyn has been discontinued.  Oral magnesium oxide will also be discontinued.  Medical oncology was asked see the patient to make recommendations regarding his pancytopenia and metastatic high-grade bladder cancer.   Past Medical History:  Diagnosis Date  . Anemia   . Arthritis   . 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 ([place]) 09/29/2016  . Bladder tumor   . Chronic kidney disease    ckd stage 3, sees dr every 6 months, arf hemodialysis done x 2 2020  . Dyspnea    with exertion  .  Dysrhythmia    atrial fib  . Essential hypertension 06/23/2018  . Goals of care, counseling/discussion 09/30/2016  . History of prostate cancer followed by pcp dr [name]-  per pt last PSA undetectable   dx 2008-- (Stage T1c, Gleason 3+3,  PSA 4.58, vol 99cc)  s/p  radical prostatectomy (nerve sparing bilateral)   . Hyperglycemia 06/23/2018  . Hypertension   . Mild hyperlipidemia 06/23/2018  . Pre-diabetes   . Wears glasses   :  Past Surgical History:  Procedure Laterality Date  . CARDIOVERSION N/A 05/18/2019   Procedure: CARDIOVERSION;  Surgeon: Lelon Perla, MD;  Location: [facility] ENDOSCOPY;  Service: Cardiovascular;  Laterality: N/A;  . CARDIOVERSION N/A 09/27/2019   Procedure: CARDIOVERSION;  Surgeon: Donato Heinz, MD;  Location: [place];  Service: Cardiovascular;  Laterality: N/A;  . CATARACT EXTRACTION W/ INTRAOCULAR LENS  IMPLANT, BILATERAL Bilateral 2011  . [place]  . IR FLUORO GUIDE PORT INSERTION RIGHT  10/07/2016  . IR RADIOLOGIST EVAL & MGMT  01/05/2018  . IR US GUIDE VASC ACCESS RIGHT  10/07/2016  . KNEE ARTHROSCOPY Bilateral right 2006;  left 02-15-2007  . [place];  1990;  1983  . ROBOT ASSISTED LAPAROSCOPIC RADICAL PROSTATECTOMY  06/20/2006   bilateral nerve sparing  . TEE WITHOUT CARDIOVERSION N/A 06/27/2018   Procedure: TRANSESOPHAGEAL ECHOCARDIOGRAM (TEE);  Surgeon: Nigel Mormon, MD;  Location: [facility] ENDOSCOPY;  Service: Cardiovascular;  Laterality: N/A;  . TOTAL HIP ARTHROPLASTY Left 03/03/2015   Procedure: LEFT TOTAL HIP ARTHROPLASTY ANTERIOR APPROACH;  Surgeon: Dorna Leitz, MD;  Location: [place];  Service: Orthopedics;  Laterality: Left;  . TOTAL KNEE ARTHROPLASTY Bilateral left 08-13-2009;  right 12-26-2009  . TRANSURETHRAL RESECTION OF BLADDER TUMOR N/A 09/20/2016   Procedure: TRANSURETHRAL RESECTION OF BLADDER TUMOR (TURBT);  Surgeon: Franchot Gallo, MD;  Location: [facility];  Service:  Urology;  Laterality: N/A;  . TRANSURETHRAL RESECTION OF BLADDER TUMOR WITH MITOMYCIN-C N/A 08/06/2019   Procedure: TRANSURETHRAL RESECTION OF BLADDER TUMOR;  Surgeon: Franchot Gallo, MD;  Location: [facility];  Service: Urology;  Laterality: N/A;  :  Current Facility-Administered Medications  Medication Dose Route Frequency Provider Last Rate Last Admin  . acetaminophen (TYLENOL) tablet 650 mg  650 mg Oral Q6H PRN Harold Hedge, MD   650 mg at 02/03/20 1815  . atorvastatin (LIPITOR) tablet 40 mg  40 mg Oral QPM Harold Hedge, MD   40 mg at 02/03/20 1721  . [START ON 02/05/2020] ceFEPIme (MAXIPIME) 2 g in sodium chloride 0.9 % 100 mL IVPB  2 g Intravenous Q24H Adhikari, Amrit, MD      . diltiazem (CARDIZEM CD) 24 hr capsule 360 mg  360 mg Oral Daily Harold Hedge, MD      . dofetilide [facility]) capsule 250 mcg  250 mcg Oral BID Harold Hedge, MD   250 mcg at 02/03/20 2305  . DULoxetine (CYMBALTA) DR capsule 60 mg  60 mg Oral Daily Harold Hedge, MD   60 mg at 02/03/20 1722  . gabapentin (NEURONTIN) capsule 800 mg  800 mg Oral QHS Harold Hedge, MD   800 mg at 02/03/20 2238  . loperamide (IMODIUM) capsule 2 mg  2 mg Oral BID PRN Harold Hedge, MD      . magnesium oxide (MAG-OX) tablet 400 mg  400 mg Oral BID Harold Hedge, MD   400 mg at 02/03/20 2238  . metoprolol succinate (TOPROL-XL) 24 hr tablet 50 mg  50 mg Oral BID Harold Hedge, MD   50 mg at 02/03/20 2238  . ondansetron (ZOFRAN) tablet 4 mg  4 mg Oral Q6H PRN Harold Hedge, MD       Or  . ondansetron [facility]) injection 4 mg  4 mg Intravenous Q6H PRN Harold Hedge, MD      . oxybutynin (DITROPAN-XL) 24 hr tablet 10 mg  10 mg Oral QHS Harold Hedge, MD   10 mg at 02/03/20 2238  . potassium chloride SA (KLOR-CON) CR tablet 20 mEq  20 mEq Oral Daily Harold Hedge, MD      . pyridOXINE (VITAMIN B-6) tablet 200 mg  200 mg Oral Q breakfast Harold Hedge, MD       Facility-Administered Medications Ordered in  Other Encounters  Medication Dose Route Frequency Provider Last Rate Last Admin  . sodium chloride flush (NS) 0.9 % injection 10 mL  10 mL Intravenous PRN [place], Holli Humbles, NP   10 mL at 02/21/17 1038  . sodium chloride flush (NS) 0.9 % injection 10 mL  10 mL Intravenous PRN Volanda Napoleon, MD   10 mL at 07/10/18 0844  . sodium chloride flush (NS) 0.9 % injection 10 mL  10 mL Intravenous PRN [place], Holli Humbles, NP   10 mL at 09/18/19 9833     No Known Allergies:  Family History  Problem Relation Age of Onset  . Hypertension Mother   . Aneurysm Mother   . Emphysema Father   . Hypertension Father   :  Social History   Socioeconomic History  . Marital status: Married    Spouse name: Not on file  . Number of children: Not on file  . Years of education: Not on file  . Highest education level: Not on file  Occupational History  . Not on file  Tobacco Use  . Smoking status: Former Smoker    Packs/day: 1.00    Years: 16.00    Pack years: 16.00    Types: Cigarettes    Quit date: 09/25/1984    Years since quitting: 35.3  . Smokeless tobacco: Never Used  Vaping Use  . Vaping Use: Never used  Substance and Sexual Activity  . Alcohol use: Yes    Comment: occasionally  . Drug use: Yes    Types: Marijuana    Comment: after chemo to help sleep  . Sexual activity: Not on file  Other Topics Concern  . Not on file  Social History Narrative  . Not on file   Social Determinants of Health   Financial Resource Strain:   . Difficulty of Paying Living Expenses: Not on file  Food Insecurity:   . Worried About Charity fundraiser in the Last Year: Not on file  . Ran Out of Food in the Last Year: Not on file  Transportation Needs:   . Lack of Transportation (Medical): Not on file  . Lack of Transportation (Non-Medical): Not on file  Physical Activity:   . Days of Exercise per Week: Not on file  . Minutes of Exercise per Session: Not on file  Stress:   . Feeling of Stress :  Not on file  Social Connections:   . Frequency of Communication with Friends and Family: Not on file  . Frequency of Social Gatherings with Friends and Family: Not on file  . Attends Religious Services: Not on file  . Active Member of Clubs or Organizations: Not on file  . Attends Archivist Meetings: Not on file  . Marital Status: Not on file  Intimate Partner Violence:   . Fear of Current or Ex-Partner: Not on file  . Emotionally Abused: Not on file  . Physically Abused: Not on file  . Sexually Abused: Not on file  :  Review of Systems: A comprehensive 14 point review of systems was negative except as noted in the HPI.  Exam: Patient Vitals for the past 24 hrs:  BP Temp Temp src Pulse Resp SpO2 Height Weight  02/04/20 0634 (!) 94/56 98 F (36.7 C) Oral 95 20 97 % -- --  02/04/20 0239 (!) 101/56 98 F (36.7 C) -- 72 (!) 21 92 % -- --  02/04/20 0100 (!) 95/53 98.1 F (36.7 C) -- 70 20 94 % -- --  02/03/20 2025 121/84 99 F (37.2 C) Oral 94 20 95 % -- --  02/03/20 1809 91/65 (!) 100.6 F (38.1 C) Oral 72 18 99 % -- --  02/03/20 1416 101/64 98.4 F (36.9 C) Oral 65 18 100 % -- 121.5 kg  02/03/20 1348 95/71 99 F (37.2 C) Oral 62 17 97 % -- --  02/03/20 1200 98/79 -- -- 83 (!) 22 99 % -- --  02/03/20 1058 -- -- -- -- -- -- $Rem'6\' 2"'vFKU$  (1.88 m) 122.5 kg  02/03/20 1041 99/70 99.2 F (37.3 C) Oral 95 16 100 % -- --    General: Awake and alert, no distress Eyes:  no scleral icterus.   ENT: No thrush or mucositis.  Lymphatics:  Negative cervical, supraclavicular or axillary adenopathy.   Respiratory: lungs were clear bilaterally without wheezing or crackles.   Cardiovascular: Rate controlled, occasional irregular beat, no lower extremity edema GI:  abdomen was soft, flat, nontender, nondistended, without organomegaly.   Musculoskeletal: Strength symmetrical in the upper and lower extremities. Skin: No petechiae, few scattered bruises, laceration with bandage on his  back Neuro exam was nonfocal. Patient was alert and oriented.  Attention was good.   Language was appropriate.  Mood was normal without depression.  Speech was not pressured.  Thought content was not tangential.     Lab Results  Component Value Date   WBC 0.2 (LL) 02/04/2020   HGB 7.7 (L) 02/04/2020   HCT 22.3 (L) 02/04/2020   PLT 15 (LL) 02/04/2020   GLUCOSE 186 (H) 02/04/2020   CHOL 159 10/05/2018   TRIG 131 10/05/2018   HDL 47 10/05/2018   LDLCALC 86 10/05/2018   ALT 33 02/03/2020   AST 25 02/03/2020   NA 130 (L) 02/04/2020   K 4.1 02/04/2020   CL 99 02/04/2020   CREATININE 3.49 (H) 02/04/2020   BUN 50 (H) 02/04/2020   CO2 20 (L) 02/04/2020    No results found.   No results found.  Assessment and Plan:  1.  Metastatic high-grade bladder cancer 2.  Pancytopenia/febrile neutropenia 3.  Failure to thrive 4.  Diarrhea 5.  Multiple falls 6.  Atrial fibrillation 7.  Stage IV CKD  -The patient has developed pancytopenia and febrile neutropenia secondary to recent chemotherapy.  Will initiate Granix 480 mcg subcu daily until [place] is above 1.5.  Recommend PRBC transfusion for hemoglobin less than 8.  To units of PRBCs have been ordered for today.  Recommend platelet transfusion for platelet count less than 10,000 or active bleeding.  Will hold on transfusion today.  Recommend daily CBC with differential. -Continue IV antibiotics and follow-up on blood cultures. -Recommend dietitian and PT/OT consults. -Await results of stool for C. difficile.  If negative, continue Imodium up to 8 tablets/day.  If ineffective, may add Lomotil 2 tablets 4 times a day as needed.  Also discussed with pharmacist as I note that he is on magnesium oxide twice a day which may exacerbate the diarrhea.  Since the Tikosyn is being discontinued, magnesium oxide will also be discontinued.  Monitor magnesium level. -Continue to hold Eliquis until platelet count is at least above 50,000.  Tikosyn placed on hold  secondary to prolonged QTc interval. -Renal function is worse this morning.  Continue IV fluids and avoid nephrotoxic medications.  Mikey Bussing, DNP, AGPCNP-BC, AOCNP  ADDENDUM: I saw and examined [name] Rose this morning.  I agree with the above assessment by Erasmo Downer.  Definitely, this is all the chemotherapy induced.  The Trodelvy dose is going to have to be decreased when we see him next.  He will need G-CSF support.  His platelets are low but I would not transfuse him.  He will need to be given blood.  With his renal insufficiency, he is not can be able to mount an erythropoietic response.  Unfortunately, he is growing gram-negative rods in his blood.  We will have to make sure that he is on broad-spectrum coverage.  We will await the identification and sensitivities.  It would not surprise me if this is E. coli.  He is on Maxipime right now.  I would just keep him on this.  If he spikes a temperature, we may have to broaden the coverage.  I still believe that his cancer is not a problem right now.  I think that the Ivette Loyal is helping this.  We just need to make a dose adjustment.  It sounds like he has the atrial fibrillation.  Given his low platelets, he does not need any formal anticoagulation at this time.  We will just have to await his blood counts to come back up.  Again, we will have to see what the gram-negative rod is in the blood.  He actually looks quite good.  He sounds good.  However, I know that in the past, he has declined quickly.  His renal function like to be watched very closely.  We will need pharmacy's help with dosing antibiotics.  I appreciate the outstanding care that he is getting from all the staff upon 6 E.  [name], MD  [address] [facility] 18:27

## 2020-02-05 ENCOUNTER — Inpatient Hospital Stay (HOSPITAL_COMMUNITY): Payer: PPO

## 2020-02-05 DIAGNOSIS — I10 Essential (primary) hypertension: Secondary | ICD-10-CM | POA: Diagnosis not present

## 2020-02-05 DIAGNOSIS — Z8546 Personal history of malignant neoplasm of prostate: Secondary | ICD-10-CM | POA: Diagnosis not present

## 2020-02-05 DIAGNOSIS — N183 Chronic kidney disease, stage 3 unspecified: Secondary | ICD-10-CM | POA: Diagnosis not present

## 2020-02-05 DIAGNOSIS — E7800 Pure hypercholesterolemia, unspecified: Secondary | ICD-10-CM | POA: Diagnosis not present

## 2020-02-05 DIAGNOSIS — R627 Adult failure to thrive: Secondary | ICD-10-CM | POA: Diagnosis not present

## 2020-02-05 DIAGNOSIS — I480 Paroxysmal atrial fibrillation: Secondary | ICD-10-CM | POA: Diagnosis not present

## 2020-02-05 LAB — TYPE AND SCREEN
ABO/RH(D): A POS
Antibody Screen: NEGATIVE
Unit division: 0
Unit division: 0

## 2020-02-05 LAB — BASIC METABOLIC PANEL
Anion gap: 11 (ref 5–15)
BUN: 55 mg/dL — ABNORMAL HIGH (ref 8–23)
CO2: 19 mmol/L — ABNORMAL LOW (ref 22–32)
Calcium: 7.8 mg/dL — ABNORMAL LOW (ref 8.9–10.3)
Chloride: 101 mmol/L (ref 98–111)
Creatinine, Ser: 3.55 mg/dL — ABNORMAL HIGH (ref 0.61–1.24)
GFR, Estimated: 18 mL/min — ABNORMAL LOW (ref >60)
Glucose, Bld: 174 mg/dL — ABNORMAL HIGH (ref 70–99)
Potassium: 3.1 mmol/L — ABNORMAL LOW (ref 3.5–5.1)
Sodium: 131 mmol/L — ABNORMAL LOW (ref 135–145)

## 2020-02-05 LAB — BPAM RBC
Blood Product Expiration Date: 202111152359
Blood Product Expiration Date: 202111152359
ISSUE DATE / TIME: 202110251535
ISSUE DATE / TIME: 202110252050
Unit Type and Rh: 6200
Unit Type and Rh: 6200

## 2020-02-05 LAB — CBC WITH DIFFERENTIAL/PLATELET
HCT: 27.2 % — ABNORMAL LOW (ref 39.0–52.0)
Hemoglobin: 9.6 g/dL — ABNORMAL LOW (ref 13.0–17.0)
MCH: 33.1 pg (ref 26.0–34.0)
MCHC: 35.3 g/dL (ref 30.0–36.0)
MCV: 93.8 fL (ref 80.0–100.0)
Platelets: 15 10*3/uL — CL (ref 150–400)
RBC: 2.9 MIL/uL — ABNORMAL LOW (ref 4.22–5.81)
RDW: 14.8 % (ref 11.5–15.5)
WBC: 0.2 10*3/uL — CL (ref 4.0–10.5)
nRBC: 0 % (ref 0.0–0.2)

## 2020-02-05 LAB — SODIUM, URINE, RANDOM: Sodium, Ur: 22 mmol/L

## 2020-02-05 LAB — CREATININE, URINE, RANDOM: Creatinine, Urine: 114.27 mg/dL

## 2020-02-05 LAB — MAGNESIUM: Magnesium: 1.7 mg/dL (ref 1.7–2.4)

## 2020-02-05 MED ORDER — MAGNESIUM SULFATE 2 GM/50ML IV SOLN
2.0000 g | Freq: Once | INTRAVENOUS | Status: AC
  Administered 2020-02-05: 2 g via INTRAVENOUS
  Filled 2020-02-05: qty 50

## 2020-02-05 MED ORDER — SODIUM CHLORIDE 0.9% IV SOLUTION: Freq: Once | INTRAVENOUS | Status: AC

## 2020-02-05 MED ORDER — DIPHENOXYLATE-ATROPINE 2.5-0.025 MG PO TABS
1.0000 | ORAL_TABLET | Freq: Four times a day (QID) | ORAL | Status: DC | PRN
  Administered 2020-02-05: 1 via ORAL
  Filled 2020-02-05: qty 1

## 2020-02-05 MED ORDER — LOPERAMIDE HCL 2 MG PO CAPS
2.0000 mg | ORAL_CAPSULE | Freq: Four times a day (QID) | ORAL | Status: DC | PRN
  Administered 2020-02-05: 2 mg via ORAL
  Filled 2020-02-05: qty 1

## 2020-02-05 MED ORDER — POTASSIUM CHLORIDE CRYS ER 20 MEQ PO TBCR
40.0000 meq | EXTENDED_RELEASE_TABLET | Freq: Once | ORAL | Status: AC
  Administered 2020-02-05: 40 meq via ORAL
  Filled 2020-02-05: qty 2

## 2020-02-05 NOTE — Progress Notes (Signed)
Unfortunately, [name] Rose has Klebsiella in his blood. I am sure this probably is from the diarrhea. He is markedly neutropenic. We do have him on Neupogen.  He is on Maxipime right now. I will defer to pharmacy as to what might be appropriate for the Klebsiella. I would have no problems with change in the antibiotic to Rocephin.  He is having some hematuria. I think were going to have to give him some platelets. His platelet count is 15,000.  He still has marked neutropenia. His white count 0.2. Hemoglobin 9.6. Platelet count 15,000.  His appetite is down. He just is not that hungry. The diarrhea seems to be causing him more problems than anything else.  His stool was checked for C. difficile. This is negative.  I would probably try him on some Lomotil to see if this does not help the diarrhea.  He has had no mouth sores.  There is no cough.   On his physical exam, temperature is 98.5. Pulse 107. Blood pressure 104/72. His head and neck exam shows no oral mucositis. He has no bleeding in the oral cavity. He has no adenopathy in the neck. Lungs are clear bilaterally. Cardiac exam shows the atrial fibrillation. The rate is little bit on the higher side. Abdomen is slightly distended. Bowel sounds are present. There is no guarding or rebound tenderness. There is no obvious abdominal mass. Extremities shows some chronic mild edema in his legs. Neurological exam shows no focal neurological deficits.  We really have to work on the neutropenia. He is on Neupogen. I would like to think that his white cell count is going to improve in the next day or so.  We will give him some platelets today. I think this will help with the hematuria that he is having.  I know that he is getting outstanding care from all the staff upon 6 E. I appreciate their compassion.    [name], MD  [address] [place] 2:15

## 2020-02-05 NOTE — Evaluation (Signed)
Physical Therapy Evaluation Patient Details Name: [name] Rose MRN: [id number] DOB: Feb 15, 1951 Today's Date: 02/05/2020   History of Present Illness  69 yo male admitted with FTT, pancytopenia, thrombocytopenia, sepsis, weakness. hx of met prostate ca with bladder mets, Afib, CKD  Clinical Impression  On eval, pt required Min assist for mobility. He performed a stand pivot then took a few steps to the recliner with use of a RW. Pt presents with general weakness, decreased activity tolerance, and impaired gait and balance. Will plan to follow and progress activity as tolerated. D/C plan is for home. Will recommend HHPT f/u.     Follow Up Recommendations Home health PT;Supervision/Assistance - 24 hour    Equipment Recommendations  None recommended by PT    Recommendations for Other Services       Precautions / Restrictions Precautions Precautions: Fall Restrictions Weight Bearing Restrictions: No      Mobility  Bed Mobility Overal bed mobility: Needs Assistance Bed Mobility: Supine to Sit     Supine to sit: Min assist;HOB elevated     General bed mobility comments: Assist for trunk. Increased time and effort for pt.    Transfers Overall transfer level: Needs assistance Equipment used: Rolling walker (2 wheeled) Transfers: Sit to/from [facility] Sit to Stand: Min assist;From elevated surface Stand pivot transfers: Min assist       General transfer comment: Assist to power up, stabilize, control descent. Vcs safety, technique, hand placement.  Ambulation/Gait Ambulation/Gait assistance: Min assist Gait Distance (Feet): 3 Feet Assistive device: Rolling walker (2 wheeled) Gait Pattern/deviations: Step-through pattern;Decreased stride length     General Gait Details: Assist to steady. Pt walked a short distance from bsc to recliner with RW.  Stairs            Wheelchair Mobility    Modified Rankin (Stroke Patients Only)        Balance                                             Pertinent Vitals/Pain Pain Assessment: No/denies pain    Home Living Family/patient expects to be discharged to:: Private residence Living Arrangements: Spouse/significant other Available Help at Discharge: Family Type of Home: House Home Access: Stairs to enter Entrance Stairs-Rails: None Entrance Stairs-Number of Steps: 1 Home Layout: One level Home Equipment: Environmental consultant - 2 wheels;Bedside commode      Prior Function Level of Independence: Independent               Hand Dominance        Extremity/Trunk Assessment   Upper Extremity Assessment Upper Extremity Assessment: Defer to OT evaluation    Lower Extremity Assessment Lower Extremity Assessment:  (neuropathy)    Cervical / Trunk Assessment Cervical / Trunk Assessment: Normal  Communication   Communication: No difficulties  Cognition Arousal/Alertness: Awake/alert Behavior During Therapy: WFL for tasks assessed/performed Overall Cognitive Status: Within Functional Limits for tasks assessed                                        General Comments      Exercises     Assessment/Plan    PT Assessment Patient needs continued PT services  PT Problem List Decreased strength;Decreased mobility;Decreased activity tolerance;Decreased balance;Decreased knowledge of use of  DME       PT Treatment Interventions DME instruction;Gait training;Therapeutic activities;Therapeutic exercise;Patient/family education;Balance training;Functional mobility training    PT Goals (Current goals can be found in the Care Plan section)  Acute Rehab PT Goals Patient Stated Goal: regain strength. home. PT Goal Formulation: With patient/family Time For Goal Achievement: 02/19/20 Potential to Achieve Goals: Good    Frequency Min 3X/week   Barriers to discharge        Co-evaluation               AM-PAC PT "6 Clicks" Mobility   Outcome Measure Help needed turning from your back to your side while in a flat bed without using bedrails?: A Little Help needed moving from lying on your back to sitting on the side of a flat bed without using bedrails?: A Little Help needed moving to and from a bed to a chair (including a wheelchair)?: A Little Help needed standing up from a chair using your arms (e.g., wheelchair or bedside chair)?: A Little Help needed to walk in hospital room?: A Little Help needed climbing 3-5 steps with a railing? : A Lot 6 Click Score: 17    End of Session Equipment Utilized During Treatment: Gait belt Activity Tolerance: Patient tolerated treatment well Patient left: in chair;with call bell/phone within reach;with family/visitor present   PT Visit Diagnosis: Muscle weakness (generalized) (M62.81);History of falling (Z91.81);Repeated falls (R29.6)    Time: 0920-0415 PT Time Calculation (min) (ACUTE ONLY): 33 min   Charges:   PT Evaluation $PT Eval Moderate Complexity: 1 Mod PT Treatments $Gait Training: 8-22 mins          Doreatha Massed, PT Acute Rehabilitation  Office: [phone] Pager: [phone]

## 2020-02-05 NOTE — Progress Notes (Signed)
PROGRESS NOTE    [name] Rose  HEN:[id number] DOB: 19-Jun-1950 DOA: 02/03/2020 PCP: Shirline Frees, MD   Chief Complain: Weakness  Brief Narrative: Patient is a 69 year old male with history of metastatic bladder cancer, currently on chemotherapy, paroxysmal A. fib, hypertension, CKD stage IV who presents to the emergency department complaints of generalized weakness and diarrhea for last 4 to 5 days.  He complains of decreased appetite but no abdominal pain, fevers or chills.  He has been falling frequently at home.  When EMS saw him, he was hypotensive.  On presentation he was hypotensive.  Lab work showed severe leukopenia, thrombocytopenia.  Patient was admitted for the management of sepsis secondary to neutropenia and started on cefepime.  Oncology consulted.  Blood cultures  showing Klebsiella pneumonia.  Assessment & Plan:   Principal Problem:   Failure to thrive in adult Active Problems:   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 (HCC)   Diarrhea   Chronic a-fib (HCC)   Pancytopenia (HCC)   Fall at home, initial encounter   Pancytopenia: WBC 0.2, platelets of 30 on presentation.  Pancytopenia secondary to chemotherapy.  Oncology consulted and following.  Continue to monitor CBC.  Transfused with 2 units of PRBC during this hospitalization. He is also being given Granix. He has severe thrombocytopenia of 15 K only, being transfused with platelets today.  Gram-negative bacteremia/sepsis: Blood cultures now showing Klebsiella pneumonia.  Continue ceftriaxone.  Follow final culture.  Blood pressure soft.  Continue IV fluids.  AKI on CKD stage IV: Baseline creatinine around 2.5.Creatinine bumped up to the range of 3 .  Continue  gentle IV fluids.  He was complaining of severe diarrhea from chemotherapy and it has been going on for several days.  He is volume depleted and dehydrated  We will check urine sodium, creatinine,renal ultrasound.  He follows with  nephrology  Diarrhea: Most likely associated with recent chemotherapy.  C. difficile negative.  Denies any abdominal pain.  Continue Imodium as needed  Failure to thrive/multiple falls: Decreased appetite, falling frequently at home.    PT consulted.   Will continue gentle IV fluids.  Metastatic bladder cancer: Follows with oncology, [name].  On chemotherapy.  He also underwent tumor resection by urology in the past.  Permanent  A. fib: Currently rate is controlled.  On Tikosyn, Cardizem, Toprol at home which have been held due to hypotension.  On Eliquis for anticoagulation. Eliquis on hold due to worsening thrombocytopenia.  Hypertension: Currently BP soft.  Antihypertensives on hold.           DVT prophylaxis:SCD Code Status: Full Family Communication: wife at bedside on 02/04/20 Status is: Inpatient  Remains inpatient appropriate because:Inpatient level of care appropriate due to severity of illness   Dispo: The patient is from: Home              Anticipated d/c is to: Home              Anticipated d/c date is: 2 days              Patient currently is not medically stable to d/c.     Consultants: oncology  Procedures:None  Antimicrobials:  Anti-infectives (From admission, onward)   Start     Dose/Rate Route Frequency Ordered Stop   02/05/20 0600  ceFEPIme (MAXIPIME) 2 g in sodium chloride 0.9 % 100 mL IVPB  Status:  Discontinued        2 g 200 mL/hr over 30 Minutes Intravenous  Every 24 hours 02/04/20 0719 02/04/20 1150   02/04/20 1400  cefTRIAXone (ROCEPHIN) 2 g in sodium chloride 0.9 % 100 mL IVPB        2 g 200 mL/hr over 30 Minutes Intravenous Every 24 hours 02/04/20 1150     02/03/20 1830  ceFEPIme (MAXIPIME) 2 g in sodium chloride 0.9 % 100 mL IVPB  Status:  Discontinued        2 g 200 mL/hr over 30 Minutes Intravenous Every 12 hours 02/03/20 1825 02/04/20 0719      Subjective: Patient seen and examined at the bedside this morning.  He says he  feels better today.  He looks dehydrated .  Complains of dryness of mouth.  Diarrhea still has not improved.  Denies any abdominal pain.  Afebrile, blood pressure better than yesterday  Objective: Vitals:   02/04/20 2055 02/04/20 2115 02/04/20 2345 02/05/20 0455  BP: 110/68 116/66 114/67 104/72  Pulse: (!) 105 100 (!) 101 (!) 107  Resp: $Remo'16 18 16 18  'oImnq$ Temp: 98.4 F (36.9 C) 98.7 F (37.1 C) 99 F (37.2 C) 98.5 F (36.9 C)  TempSrc: Oral Oral Oral Oral  SpO2: 100% 97% 100% 99%  Weight:      Height:        Intake/Output Summary (Last 24 hours) at 02/05/2020 0817 Last data filed at 02/05/2020 0600 Gross per 24 hour  Intake 2917.7 ml  Output 1100 ml  Net 1817.7 ml   Filed Weights   02/03/20 1058 02/03/20 1416  Weight: 122.5 kg 121.5 kg    Examination:  General exam: Appears calm and comfortable ,Not in distress,obese HEENT:Dry mouth Respiratory system: Bilateral equal air entry, normal vesicular breath sounds, no wheezes or crackles  Cardiovascular system: Irregularly irregular rhythm. No JVD, murmurs, rubs, gallops or clicks. Gastrointestinal system: Abdomen is nondistended, soft and nontender. No organomegaly or masses felt. Normal bowel sounds heard. Central nervous system: Alert and oriented. No focal neurological deficits. Extremities: No edema, no clubbing ,no cyanosis Skin: No rashes, lesions or ulcers,no icterus ,no pallor    Data Reviewed: I have personally reviewed following labs and imaging studies  CBC: Recent Labs  Lab 02/03/20 1128 02/04/20 0539 02/05/20 0500  WBC 0.2* 0.2* 0.2*  NEUTROABS 0.0*  --   --   HGB 8.6* 7.7* 9.6*  HCT 26.0* 22.3* 27.2*  MCV 102.4* 100.0 93.8  PLT 30* 15* 15*   Basic Metabolic Panel: Recent Labs  Lab 02/03/20 1128 02/04/20 0539 02/05/20 0500  NA 130* 130* 131*  K 4.0 4.1 3.1*  CL 99 99 101  CO2 21* 20* 19*  GLUCOSE 202* 186* 174*  BUN 43* 50* 55*  CREATININE 2.55* 3.49* 3.55*  CALCIUM 8.0* 8.4* 7.8*  MG 1.5*   --   --    GFR: Estimated Creatinine Clearance: 27.6 mL/min (A) (by C-G formula based on SCr of 3.55 mg/dL (H)). Liver Function Tests: Recent Labs  Lab 02/03/20 1128  AST 25  ALT 33  ALKPHOS 69  BILITOT 1.2  PROT 5.4*  ALBUMIN 3.0*   No results for input(s): LIPASE, AMYLASE in the last 168 hours. No results for input(s): AMMONIA in the last 168 hours. Coagulation Profile: No results for input(s): INR, PROTIME in the last 168 hours. Cardiac Enzymes: No results for input(s): CKTOTAL, CKMB, CKMBINDEX, TROPONINI in the last 168 hours. BNP (last 3 results) No results for input(s): PROBNP in the last 8760 hours. HbA1C: No results for input(s): HGBA1C in the last 72 hours. CBG:  No results for input(s): GLUCAP in the last 168 hours. Lipid Profile: No results for input(s): CHOL, HDL, LDLCALC, TRIG, CHOLHDL, LDLDIRECT in the last 72 hours. Thyroid Function Tests: No results for input(s): TSH, T4TOTAL, FREET4, T3FREE, THYROIDAB in the last 72 hours. Anemia Panel: No results for input(s): VITAMINB12, FOLATE, FERRITIN, TIBC, IRON, RETICCTPCT in the last 72 hours. Sepsis Labs: No results for input(s): PROCALCITON, LATICACIDVEN in the last 168 hours.  Recent Results (from the past 240 hour(s))  Respiratory Panel by RT PCR (Flu A&B, Covid) - Nasopharyngeal Swab     Status: None   Collection Time: 02/03/20 11:28 AM   Specimen: Nasopharyngeal Swab  Result Value Ref Range Status   SARS Coronavirus 2 by RT PCR NEGATIVE NEGATIVE Final    Comment: (NOTE) SARS-CoV-2 target nucleic acids are NOT DETECTED.  The SARS-CoV-2 RNA is generally detectable in upper respiratoy specimens during the acute phase of infection. The lowest concentration of SARS-CoV-2 viral copies this assay can detect is 131 copies/mL. A negative result does not preclude SARS-Cov-2 infection and should not be used as the sole basis for treatment or other patient management decisions. A negative result may occur with    improper specimen collection/handling, submission of specimen other than nasopharyngeal swab, presence of viral mutation(s) within the areas targeted by this assay, and inadequate number of viral copies (<131 copies/mL). A negative result must be combined with clinical observations, patient history, and epidemiological information. The expected result is Negative.  Fact Sheet for Patients:  PinkCheek.be  Fact Sheet for Healthcare Providers:  GravelBags.it  This test is no t yet approved or cleared by the [place] FDA and  has been authorized for detection and/or diagnosis of SARS-CoV-2 by FDA under an Emergency Use Authorization (EUA). This EUA will remain  in effect (meaning this test can be used) for the duration of the COVID-19 declaration under Section 564(b)(1) of the Act, 21 U.S.C. section 360bbb-3(b)(1), unless the authorization is terminated or revoked sooner.     Influenza A by PCR NEGATIVE NEGATIVE Final   Influenza B by PCR NEGATIVE NEGATIVE Final    Comment: (NOTE) The Xpert Xpress SARS-CoV-2/FLU/RSV assay is intended as an aid in  the diagnosis of influenza from Nasopharyngeal swab specimens and  should not be used as a sole basis for treatment. Nasal washings and  aspirates are unacceptable for Xpert Xpress SARS-CoV-2/FLU/RSV  testing.  Fact Sheet for Patients: PinkCheek.be  Fact Sheet for Healthcare Providers: GravelBags.it  This test is not yet approved or cleared by the [place] FDA and  has been authorized for detection and/or diagnosis of SARS-CoV-2 by  FDA under an Emergency Use Authorization (EUA). This EUA will remain  in effect (meaning this test can be used) for the duration of the  Covid-19 declaration under Section 564(b)(1) of the Act, 21  U.S.C. section 360bbb-3(b)(1), unless the authorization is  terminated or  revoked. Performed at [facility], [place] [address] [place], [place] [address]   Culture, blood (x 2)     Status: None (Preliminary result)   Collection Time: 02/03/20  6:50 PM   Specimen: BLOOD RIGHT HAND  Result Value Ref Range Status   Specimen Description   Final    BLOOD RIGHT HAND Performed at [facility] [address], [place], [place] [address]    Special Requests   Final    BOTTLES DRAWN AEROBIC ONLY Blood Culture results may not be optimal due to an inadequate volume of blood  received in culture bottles Performed at [facility], [place] [address] [place], [place] [address]    Culture  Setup Time   Final    GRAM NEGATIVE RODS AEROBIC BOTTLE ONLY CRITICAL VALUE NOTED.  VALUE IS CONSISTENT WITH PREVIOUSLY REPORTED AND CALLED VALUE. Performed at [facility], [place] [address], [place], [place] [address]    Culture GRAM NEGATIVE RODS  Final   Report Status PENDING  Incomplete  Culture, blood (x 2)     Status: None (Preliminary result)   Collection Time: 02/03/20  6:50 PM   Specimen: BLOOD  Result Value Ref Range Status   Specimen Description   Final    BLOOD RIGHT ANTECUBITAL Performed at [facility], [place] [address] [place], [place] [address]    Special Requests   Final    BOTTLES DRAWN AEROBIC ONLY Blood Culture results may not be optimal due to an excessive volume of blood received in culture bottles Performed at [facility] [address], [place], [place] [address]    Culture  Setup Time   Final    GRAM NEGATIVE RODS AEROBIC BOTTLE ONLY CRITICAL RESULT CALLED TO, READ BACK BY AND VERIFIED WITH: T,GREEN PHARMD $RemoveBefore'@1113'[email or web address]$  02/04/20 EB Performed at [facility] [address] [place], [place] [address]    Culture GRAM NEGATIVE RODS  Final   Report Status PENDING  Incomplete  Blood Culture ID Panel (Reflexed)     Status: Abnormal   Collection Time: 02/03/20  6:50 PM  Result  Value Ref Range Status   Enterococcus faecalis NOT DETECTED NOT DETECTED Final   Enterococcus Faecium NOT DETECTED NOT DETECTED Final   Listeria monocytogenes NOT DETECTED NOT DETECTED Final   Staphylococcus species NOT DETECTED NOT DETECTED Final   Staphylococcus aureus (BCID) NOT DETECTED NOT DETECTED Final   Staphylococcus epidermidis NOT DETECTED NOT DETECTED Final   Staphylococcus lugdunensis NOT DETECTED NOT DETECTED Final   Streptococcus species NOT DETECTED NOT DETECTED Final   Streptococcus agalactiae NOT DETECTED NOT DETECTED Final   Streptococcus pneumoniae NOT DETECTED NOT DETECTED Final   Streptococcus pyogenes NOT DETECTED NOT DETECTED Final   A.calcoaceticus-baumannii NOT DETECTED NOT DETECTED Final   Bacteroides fragilis NOT DETECTED NOT DETECTED Final   Enterobacterales DETECTED (A) NOT DETECTED Final    Comment: Enterobacterales represent a large order of gram negative bacteria, not a single organism. CRITICAL RESULT CALLED TO, READ BACK BY AND VERIFIED WITH: T,GREEN PHARMD $RemoveBefore'@1113'[id number]$  02/04/20 EB    Enterobacter cloacae complex NOT DETECTED NOT DETECTED Final   Escherichia coli NOT DETECTED NOT DETECTED Final   Klebsiella aerogenes NOT DETECTED NOT DETECTED Final   Klebsiella oxytoca NOT DETECTED NOT DETECTED Final   Klebsiella pneumoniae DETECTED (A) NOT DETECTED Final    Comment: CRITICAL RESULT CALLED TO, READ BACK BY AND VERIFIED WITH: T,GREEN PHARMD $RemoveBefore'@1113'[email or web address]$  02/04/20 EB    Proteus species NOT DETECTED NOT DETECTED Final   Salmonella species NOT DETECTED NOT DETECTED Final   Serratia marcescens NOT DETECTED NOT DETECTED Final   Haemophilus influenzae NOT DETECTED NOT DETECTED Final   Neisseria meningitidis NOT DETECTED NOT DETECTED Final   Pseudomonas aeruginosa NOT DETECTED NOT DETECTED Final   Stenotrophomonas maltophilia NOT DETECTED NOT DETECTED Final   Candida albicans NOT DETECTED NOT DETECTED Final   Candida auris NOT DETECTED NOT DETECTED Final   Candida  glabrata NOT DETECTED NOT DETECTED Final   Candida krusei NOT DETECTED NOT DETECTED Final   Candida parapsilosis NOT  DETECTED NOT DETECTED Final   Candida tropicalis NOT DETECTED NOT DETECTED Final   Cryptococcus neoformans/gattii NOT DETECTED NOT DETECTED Final   CTX-M ESBL NOT DETECTED NOT DETECTED Final   Carbapenem resistance IMP NOT DETECTED NOT DETECTED Final   Carbapenem resistance KPC NOT DETECTED NOT DETECTED Final   Carbapenem resistance NDM NOT DETECTED NOT DETECTED Final   Carbapenem resist OXA 48 LIKE NOT DETECTED NOT DETECTED Final   Carbapenem resistance VIM NOT DETECTED NOT DETECTED Final    Comment: Performed at [facility], [place] [address], [place], [place] [address]  C Difficile Quick Screen w PCR reflex     Status: None   Collection Time: 02/04/20  5:07 PM   Specimen: STOOL  Result Value Ref Range Status   C Diff antigen NEGATIVE NEGATIVE Final   C Diff toxin NEGATIVE NEGATIVE Final   C Diff interpretation No C. difficile detected.  Final    Comment: Performed at [facility], [place] [address], [place], [place] [address]         Radiology Studies: No results found.      Scheduled Meds: . sodium chloride   Intravenous Once  . atorvastatin  40 mg Oral QPM  . Chlorhexidine Gluconate Cloth  6 each Topical Daily  . DULoxetine  60 mg Oral Daily  . gabapentin  800 mg Oral QHS  . metoprolol succinate  50 mg Oral BID  . oxybutynin  10 mg Oral QHS  . potassium chloride  40 mEq Oral Once  . pyridOXINE  200 mg Oral Q breakfast  . Tbo-Filgrastim  480 mcg Subcutaneous q1800   Continuous Infusions: . sodium chloride 100 mL/hr at 02/04/20 1340  . cefTRIAXone (ROCEPHIN)  IV 2 g (02/04/20 1339)     LOS: 2 days    Time spent: 35 mins.,More than 50% of that time was spent in counseling and/or coordination of care.      Shelly Coss, MD [facility] P10/26/2021, 8:17 AM

## 2020-02-06 DIAGNOSIS — R627 Adult failure to thrive: Secondary | ICD-10-CM | POA: Diagnosis not present

## 2020-02-06 LAB — BPAM PLATELET PHERESIS
Blood Product Expiration Date: 202110282359
ISSUE DATE / TIME: 202110261135
Unit Type and Rh: 6200

## 2020-02-06 LAB — BASIC METABOLIC PANEL
Anion gap: 12 (ref 5–15)
BUN: 58 mg/dL — ABNORMAL HIGH (ref 8–23)
CO2: 18 mmol/L — ABNORMAL LOW (ref 22–32)
Calcium: 8 mg/dL — ABNORMAL LOW (ref 8.9–10.3)
Chloride: 100 mmol/L (ref 98–111)
Creatinine, Ser: 4.15 mg/dL — ABNORMAL HIGH (ref 0.61–1.24)
GFR, Estimated: 15 mL/min — ABNORMAL LOW (ref >60)
Glucose, Bld: 155 mg/dL — ABNORMAL HIGH (ref 70–99)
Potassium: 3.2 mmol/L — ABNORMAL LOW (ref 3.5–5.1)
Sodium: 130 mmol/L — ABNORMAL LOW (ref 135–145)

## 2020-02-06 LAB — CULTURE, BLOOD (ROUTINE X 2)

## 2020-02-06 LAB — PREPARE PLATELET PHERESIS: Unit division: 0

## 2020-02-06 LAB — CBC WITH DIFFERENTIAL/PLATELET
HCT: 28.3 % — ABNORMAL LOW (ref 39.0–52.0)
Hemoglobin: 10 g/dL — ABNORMAL LOW (ref 13.0–17.0)
MCH: 33.7 pg (ref 26.0–34.0)
MCHC: 35.3 g/dL (ref 30.0–36.0)
MCV: 95.3 fL (ref 80.0–100.0)
Platelets: 24 10*3/uL — CL (ref 150–400)
RBC: 2.97 MIL/uL — ABNORMAL LOW (ref 4.22–5.81)
RDW: 14.9 % (ref 11.5–15.5)
WBC: 0.3 10*3/uL — CL (ref 4.0–10.5)
nRBC: 0 % (ref 0.0–0.2)

## 2020-02-06 MED ORDER — METOPROLOL TARTRATE 5 MG/5ML IV SOLN
INTRAVENOUS | Status: AC
  Administered 2020-02-06: 5 mg via INTRAVENOUS
  Filled 2020-02-06: qty 5

## 2020-02-06 MED ORDER — CHLORPROMAZINE HCL 10 MG PO TABS
10.0000 mg | ORAL_TABLET | Freq: Three times a day (TID) | ORAL | Status: DC | PRN
  Administered 2020-02-06 – 2020-02-07 (×2): 10 mg via ORAL
  Filled 2020-02-06 (×4): qty 1

## 2020-02-06 MED ORDER — SODIUM CHLORIDE 0.9 % IV BOLUS
2000.0000 mL | Freq: Once | INTRAVENOUS | Status: AC
  Administered 2020-02-06: 2000 mL via INTRAVENOUS

## 2020-02-06 MED ORDER — SACCHAROMYCES BOULARDII 250 MG PO CAPS
250.0000 mg | ORAL_CAPSULE | Freq: Two times a day (BID) | ORAL | Status: DC
  Administered 2020-02-06 – 2020-02-12 (×6): 250 mg via ORAL
  Filled 2020-02-06 (×7): qty 1

## 2020-02-06 MED ORDER — METOPROLOL SUCCINATE ER 50 MG PO TB24
50.0000 mg | ORAL_TABLET | Freq: Two times a day (BID) | ORAL | Status: DC
  Administered 2020-02-06 – 2020-02-07 (×3): 50 mg via ORAL
  Filled 2020-02-06 (×3): qty 1

## 2020-02-06 MED ORDER — POTASSIUM CHLORIDE CRYS ER 20 MEQ PO TBCR
40.0000 meq | EXTENDED_RELEASE_TABLET | Freq: Once | ORAL | Status: AC
  Administered 2020-02-06: 40 meq via ORAL
  Filled 2020-02-06: qty 2

## 2020-02-06 MED ORDER — METOPROLOL TARTRATE 5 MG/5ML IV SOLN: 5.0000 mg | Freq: Once | INTRAVENOUS | Status: AC

## 2020-02-06 MED ORDER — DILTIAZEM HCL-DEXTROSE 125-5 MG/125ML-% IV SOLN (PREMIX)
5.0000 mg/h | INTRAVENOUS | Status: DC
  Filled 2020-02-06: qty 125

## 2020-02-06 MED ORDER — DILTIAZEM HCL 25 MG/5ML IV SOLN
10.0000 mg | Freq: Once | INTRAVENOUS | Status: DC
  Filled 2020-02-06: qty 5

## 2020-02-06 MED ORDER — TRAMADOL HCL 50 MG PO TABS
50.0000 mg | ORAL_TABLET | Freq: Four times a day (QID) | ORAL | Status: DC | PRN
  Administered 2020-02-06: 50 mg via ORAL
  Filled 2020-02-06: qty 1

## 2020-02-06 MED ORDER — DOFETILIDE 250 MCG PO CAPS: 250.0000 ug | ORAL_CAPSULE | Freq: Two times a day (BID) | ORAL | Status: DC

## 2020-02-06 MED ORDER — CEFAZOLIN SODIUM-DEXTROSE 2-4 GM/100ML-% IV SOLN
2.0000 g | Freq: Two times a day (BID) | INTRAVENOUS | Status: DC
  Administered 2020-02-06 – 2020-02-11 (×11): 2 g via INTRAVENOUS
  Filled 2020-02-06 (×11): qty 100

## 2020-02-06 MED ORDER — MELATONIN 3 MG PO TABS
6.0000 mg | ORAL_TABLET | Freq: Every evening | ORAL | Status: DC | PRN
  Administered 2020-02-06 – 2020-02-20 (×3): 6 mg via ORAL
  Filled 2020-02-06 (×3): qty 2

## 2020-02-06 MED ORDER — DILTIAZEM HCL ER COATED BEADS 180 MG PO CP24
360.0000 mg | ORAL_CAPSULE | Freq: Every day | ORAL | Status: DC
  Administered 2020-02-06 – 2020-02-07 (×2): 360 mg via ORAL
  Filled 2020-02-06 (×2): qty 2

## 2020-02-06 NOTE — Progress Notes (Signed)
Granix not available. Pharmacy states will bring one up shortly.

## 2020-02-06 NOTE — TOC Progression Note (Signed)
Transition of Care [facility]) - Progression Note    Patient Details  Name: [name] Rose MRN: [id number] Date of Birth: 04/19/1950  Transition of Care [facility]) CM/SW Contact  Purcell Mouton, RN Phone Number: 02/06/2020, 4:12 PM  Clinical Narrative:     [facility] can follow pt at home for OT/PT/RN when discharged  Expected Discharge Plan: Home/Self Care Barriers to Discharge: No Barriers Identified  Expected Discharge Plan and Services Expected Discharge Plan: Home/Self Care       Living arrangements for the past 2 months: Single Family Home                                       Social Determinants of Health (SDOH) Interventions    Readmission Risk Interventions Readmission Risk Prevention Plan 09/26/2019 09/08/2018 06/28/2018  Transportation Screening Complete Complete Complete  PCP or Specialist Appt within 3-5 Days Complete - Complete  HRI or Home Care Consult Complete - Complete  Social Work Consult for [place] Planning/Counseling Complete - Complete  Palliative Care Screening Not Applicable - Not Applicable  Medication Review Press photographer) Complete Complete Complete  PCP or Specialist appointment within 3-5 days of discharge - Not Complete -  PCP/Specialist Appt Not Complete comments - not yet ready for d/c -  HRI or [place] - Not Complete -  [place] or [place] Pt Refusal Comments - no needs identified at this time -  SW Recovery Care/Counseling Consult - Not Complete -  SW Consult Not Complete Comments - no needs identified at this time -  Palliative Care Screening - Not Applicable -  [place] - Not Applicable -  Some recent data might be hidden

## 2020-02-06 NOTE — Progress Notes (Addendum)
PROGRESS NOTE    [name] Rose  XLK:[id number] DOB: Oct 07, 1950 DOA: 02/03/2020 PCP: Shirline Frees, MD   Chief Complain: Weakness  Brief Narrative: Patient is a 69 year old male with history of metastatic bladder cancer, currently on chemotherapy, paroxysmal A. fib, hypertension, CKD stage IV who presents to the emergency department complaints of generalized weakness and diarrhea for last 4 to 5 days.  He complains of decreased appetite but no abdominal pain, fevers or chills.  He has been falling frequently at home.  When EMS saw him, he was hypotensive.  On presentation he was hypotensive.  Lab work showed severe leukopenia, thrombocytopenia.  Patient was admitted for the management of sepsis secondary to neutropenia and started on cefepime.  Oncology consulted.  Blood cultures  showing Klebsiella pneumonia.  Hospital course remarkable for progressive AKI so nephrology consulted today.  Assessment & Plan:   Principal Problem:   Failure to thrive in adult Active Problems:   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 (HCC)   Diarrhea   Chronic a-fib (HCC)   Pancytopenia (HCC)   Fall at home, initial encounter   Pancytopenia: WBC 0.2, platelets of 30 on presentation.  Pancytopenia secondary to chemotherapy.  Oncology consulted and following.  Continue to monitor CBC.  Transfused with 2 units of PRBC during this hospitalization. He is also being given Granix. He has severe thrombocytopenia of 15 K only, being transfused with platelets today.  Gram-negative bacteremia/sepsis: Blood cultures now showing Klebsiella pneumonia.  Continue ceftriaxone.  Follow final culture.  Blood pressure stable.  Continue IV fluids.  AKI on CKD stage IV: Baseline creatinine around 2.5.Creatinine bumped up to the range of 4 today .  Continue   IV fluids.  He was complaining of severe diarrhea from chemotherapy and it has been going on for several days.  He looked  volume depleted and  dehydrated  .  AKI continues to worsen despite being given IV fluids.  Renal ultrasound did not show any abnormalities.  Urine sodium is 22.  Nephrology consulted today.  He follows with [facility] as an outpatient.  Given 2 L bolus of IV fluid.  Permanent  A. fib/A. fib with RVR: On RVR after my rounds this morning.  On Tikosyn, Cardizem, Toprol at home which have been resumed .  On Eliquis for anticoagulation at home. Eliquis was held due to worsening thrombocytopenia.  We will restart when platelets are more than 30,000.    Diarrhea: Most likely associated with recent chemotherapy.  C. difficile negative.  Denies any abdominal pain.  Continue Imodium as needed.  Continues to have diarrhea.  Failure to thrive/multiple falls: Decreased appetite, falling frequently at home.    PT consulted.   Will continue gentle IV fluids.  Metastatic bladder cancer: Follows with oncology, [name].  On chemotherapy.  He also underwent tumor resection by urology in the past.  Hypertension:   He was hypotensive earlier on presentation,we will resume his home medications now         DVT prophylaxis:SCD Code Status: Full Family Communication: wife on phone on 02/06/20 Status is: Inpatient  Remains inpatient appropriate because:Inpatient level of care appropriate due to severity of illness   Dispo: The patient is from: Home              Anticipated d/c is to: Home              Anticipated d/c date is: 2 days  Patient currently is not medically stable to d/c.     Consultants: oncology  Procedures:None  Antimicrobials:  Anti-infectives (From admission, onward)   Start     Dose/Rate Route Frequency Ordered Stop   02/05/20 0600  ceFEPIme (MAXIPIME) 2 g in sodium chloride 0.9 % 100 mL IVPB  Status:  Discontinued        2 g 200 mL/hr over 30 Minutes Intravenous Every 24 hours 02/04/20 0719 02/04/20 1150   02/04/20 1400  cefTRIAXone (ROCEPHIN) 2 g in sodium chloride 0.9 % 100 mL  IVPB        2 g 200 mL/hr over 30 Minutes Intravenous Every 24 hours 02/04/20 1150     02/03/20 1830  ceFEPIme (MAXIPIME) 2 g in sodium chloride 0.9 % 100 mL IVPB  Status:  Discontinued        2 g 200 mL/hr over 30 Minutes Intravenous Every 12 hours 02/03/20 1825 02/04/20 0719      Subjective: Patient seen and examined at the bedside this morning.  Hemodynamically stable during my evaluation but complained of severe weakness, dry mouth, thirsty.  I was notified by RN that he went into A. fib with RVR in the late morning.  Objective: Vitals:   02/05/20 1203 02/05/20 1327 02/05/20 2056 02/06/20 0455  BP: 114/66 109/67 123/74 (!) 135/104  Pulse: 86 87 92 94  Resp:   18 18  Temp: 98.1 F (36.7 C) 98 F (36.7 C) 98.3 F (36.8 C) 97.7 F (36.5 C)  TempSrc: Oral Oral Oral Oral  SpO2: 100% 100% 97% 97%  Weight:      Height:        Intake/Output Summary (Last 24 hours) at 02/06/2020 0818 Last data filed at 02/06/2020 0500 Gross per 24 hour  Intake 590 ml  Output 650 ml  Net -60 ml   Filed Weights   02/03/20 1058 02/03/20 1416  Weight: 122.5 kg 121.5 kg    Examination:  General exam: Not in obvious distress, obese HEENT:Dry mouth Respiratory system: Bilateral equal air entry, normal vesicular breath sounds, no wheezes or crackles  Cardiovascular system: Irregularly irregular rhythm. No JVD, murmurs, rubs, gallops or clicks. Gastrointestinal system: Abdomen is nondistended, soft and nontender. No organomegaly or masses felt. Normal bowel sounds heard. Central nervous system: Alert and oriented. No focal neurological deficits. Extremities: No edema, no clubbing ,no cyanosis Skin: No rashes, lesions or ulcers,no icterus ,no pallor    Data Reviewed: I have personally reviewed following labs and imaging studies  CBC: Recent Labs  Lab 02/03/20 1128 02/04/20 0539 02/05/20 0500 02/06/20 0515  WBC 0.2* 0.2* 0.2* 0.3*  NEUTROABS 0.0*  --   --   --   HGB 8.6* 7.7* 9.6*  10.0*  HCT 26.0* 22.3* 27.2* 28.3*  MCV 102.4* 100.0 93.8 95.3  PLT 30* 15* 15* 24*   Basic Metabolic Panel: Recent Labs  Lab 02/03/20 1128 02/04/20 0539 02/05/20 0500 02/06/20 0515  NA 130* 130* 131* 130*  K 4.0 4.1 3.1* 3.2*  CL 99 99 101 100  CO2 21* 20* 19* 18*  GLUCOSE 202* 186* 174* 155*  BUN 43* 50* 55* 58*  CREATININE 2.55* 3.49* 3.55* 4.15*  CALCIUM 8.0* 8.4* 7.8* 8.0*  MG 1.5*  --  1.7  --    GFR: Estimated Creatinine Clearance: 23.6 mL/min (A) (by C-G formula based on SCr of 4.15 mg/dL (H)). Liver Function Tests: Recent Labs  Lab 02/03/20 1128  AST 25  ALT 33  ALKPHOS 69  BILITOT  1.2  PROT 5.4*  ALBUMIN 3.0*   No results for input(s): LIPASE, AMYLASE in the last 168 hours. No results for input(s): AMMONIA in the last 168 hours. Coagulation Profile: No results for input(s): INR, PROTIME in the last 168 hours. Cardiac Enzymes: No results for input(s): CKTOTAL, CKMB, CKMBINDEX, TROPONINI in the last 168 hours. BNP (last 3 results) No results for input(s): PROBNP in the last 8760 hours. HbA1C: No results for input(s): HGBA1C in the last 72 hours. CBG: No results for input(s): GLUCAP in the last 168 hours. Lipid Profile: No results for input(s): CHOL, HDL, LDLCALC, TRIG, CHOLHDL, LDLDIRECT in the last 72 hours. Thyroid Function Tests: No results for input(s): TSH, T4TOTAL, FREET4, T3FREE, THYROIDAB in the last 72 hours. Anemia Panel: No results for input(s): VITAMINB12, FOLATE, FERRITIN, TIBC, IRON, RETICCTPCT in the last 72 hours. Sepsis Labs: No results for input(s): PROCALCITON, LATICACIDVEN in the last 168 hours.  Recent Results (from the past 240 hour(s))  Respiratory Panel by RT PCR (Flu A&B, Covid) - Nasopharyngeal Swab     Status: None   Collection Time: 02/03/20 11:28 AM   Specimen: Nasopharyngeal Swab  Result Value Ref Range Status   SARS Coronavirus 2 by RT PCR NEGATIVE NEGATIVE Final    Comment: (NOTE) SARS-CoV-2 target nucleic acids  are NOT DETECTED.  The SARS-CoV-2 RNA is generally detectable in upper respiratoy specimens during the acute phase of infection. The lowest concentration of SARS-CoV-2 viral copies this assay can detect is 131 copies/mL. A negative result does not preclude SARS-Cov-2 infection and should not be used as the sole basis for treatment or other patient management decisions. A negative result may occur with  improper specimen collection/handling, submission of specimen other than nasopharyngeal swab, presence of viral mutation(s) within the areas targeted by this assay, and inadequate number of viral copies (<131 copies/mL). A negative result must be combined with clinical observations, patient history, and epidemiological information. The expected result is Negative.  Fact Sheet for Patients:  PinkCheek.be  Fact Sheet for Healthcare Providers:  GravelBags.it  This test is no t yet approved or cleared by the [place] FDA and  has been authorized for detection and/or diagnosis of SARS-CoV-2 by FDA under an Emergency Use Authorization (EUA). This EUA will remain  in effect (meaning this test can be used) for the duration of the COVID-19 declaration under Section 564(b)(1) of the Act, 21 U.S.C. section 360bbb-3(b)(1), unless the authorization is terminated or revoked sooner.     Influenza A by PCR NEGATIVE NEGATIVE Final   Influenza B by PCR NEGATIVE NEGATIVE Final    Comment: (NOTE) The Xpert Xpress SARS-CoV-2/FLU/RSV assay is intended as an aid in  the diagnosis of influenza from Nasopharyngeal swab specimens and  should not be used as a sole basis for treatment. Nasal washings and  aspirates are unacceptable for Xpert Xpress SARS-CoV-2/FLU/RSV  testing.  Fact Sheet for Patients: PinkCheek.be  Fact Sheet for Healthcare Providers: GravelBags.it  This test is not  yet approved or cleared by the [place] FDA and  has been authorized for detection and/or diagnosis of SARS-CoV-2 by  FDA under an Emergency Use Authorization (EUA). This EUA will remain  in effect (meaning this test can be used) for the duration of the  Covid-19 declaration under Section 564(b)(1) of the Act, 21  U.S.C. section 360bbb-3(b)(1), unless the authorization is  terminated or revoked. Performed at [facility], [place] [address], [place], [place] [address]   Culture, blood (x 2)  Status: Abnormal   Collection Time: 02/03/20  6:50 PM   Specimen: BLOOD RIGHT HAND  Result Value Ref Range Status   Specimen Description   Final    BLOOD RIGHT HAND Performed at [facility] [address], [place], [place] [address]    Special Requests   Final    BOTTLES DRAWN AEROBIC ONLY Blood Culture results may not be optimal due to an inadequate volume of blood received in culture bottles Performed at [facility] [address], [place], [place] [address]    Culture  Setup Time   Final    GRAM NEGATIVE RODS AEROBIC BOTTLE ONLY CRITICAL VALUE NOTED.  VALUE IS CONSISTENT WITH PREVIOUSLY REPORTED AND CALLED VALUE.    Culture (A)  Final    KLEBSIELLA PNEUMONIAE SUSCEPTIBILITIES PERFORMED ON PREVIOUS CULTURE WITHIN THE LAST 5 DAYS. Performed at [facility], [place] [address], [place], [place] [address]    Report Status 02/06/2020 FINAL  Final  Culture, blood (x 2)     Status: Abnormal   Collection Time: 02/03/20  6:50 PM   Specimen: BLOOD  Result Value Ref Range Status   Specimen Description   Final    BLOOD RIGHT ANTECUBITAL Performed at [facility], [place] [address] [place], [place] [address]    Special Requests   Final    BOTTLES DRAWN AEROBIC ONLY Blood Culture results may not be optimal due to an excessive volume of blood received in culture bottles Performed at [facility]  [address], [place], [place] [address]    Culture  Setup Time   Final    GRAM NEGATIVE RODS AEROBIC BOTTLE ONLY CRITICAL RESULT CALLED TO, READ BACK BY AND VERIFIED WITH: T,GREEN PHARMD $RemoveBefore'@1113'[id number]$  02/04/20 EB Performed at [facility] [address] [place], [place] [address]    Culture KLEBSIELLA PNEUMONIAE (A)  Final   Report Status 02/06/2020 FINAL  Final   Organism ID, Bacteria KLEBSIELLA PNEUMONIAE  Final      Susceptibility   Klebsiella pneumoniae - MIC*    AMPICILLIN >=32 RESISTANT Resistant     CEFAZOLIN <=4 SENSITIVE Sensitive     CEFEPIME <=0.12 SENSITIVE Sensitive     CEFTAZIDIME <=1 SENSITIVE Sensitive     CEFTRIAXONE <=0.25 SENSITIVE Sensitive     CIPROFLOXACIN <=0.25 SENSITIVE Sensitive     GENTAMICIN <=1 SENSITIVE Sensitive     IMIPENEM <=0.25 SENSITIVE Sensitive     TRIMETH/SULFA <=20 SENSITIVE Sensitive     AMPICILLIN/SULBACTAM 4 SENSITIVE Sensitive     PIP/TAZO <=4 SENSITIVE Sensitive     * KLEBSIELLA PNEUMONIAE  Blood Culture ID Panel (Reflexed)     Status: Abnormal   Collection Time: 02/03/20  6:50 PM  Result Value Ref Range Status   Enterococcus faecalis NOT DETECTED NOT DETECTED Final   Enterococcus Faecium NOT DETECTED NOT DETECTED Final   Listeria monocytogenes NOT DETECTED NOT DETECTED Final   Staphylococcus species NOT DETECTED NOT DETECTED Final   Staphylococcus aureus (BCID) NOT DETECTED NOT DETECTED Final   Staphylococcus epidermidis NOT DETECTED NOT DETECTED Final   Staphylococcus lugdunensis NOT DETECTED NOT DETECTED Final   Streptococcus species NOT DETECTED NOT DETECTED Final   Streptococcus agalactiae NOT DETECTED NOT DETECTED Final   Streptococcus pneumoniae NOT DETECTED NOT DETECTED Final   Streptococcus pyogenes NOT DETECTED NOT DETECTED Final   A.calcoaceticus-baumannii NOT DETECTED NOT DETECTED Final   Bacteroides fragilis NOT DETECTED NOT DETECTED Final   Enterobacterales DETECTED (A) NOT  DETECTED Final    Comment: Enterobacterales  represent a large order of gram negative bacteria, not a single organism. CRITICAL RESULT CALLED TO, READ BACK BY AND VERIFIED WITH: T,GREEN PHARMD $RemoveBefore'@1113'[id number]$  02/04/20 EB    Enterobacter cloacae complex NOT DETECTED NOT DETECTED Final   Escherichia coli NOT DETECTED NOT DETECTED Final   Klebsiella aerogenes NOT DETECTED NOT DETECTED Final   Klebsiella oxytoca NOT DETECTED NOT DETECTED Final   Klebsiella pneumoniae DETECTED (A) NOT DETECTED Final    Comment: CRITICAL RESULT CALLED TO, READ BACK BY AND VERIFIED WITH: T,GREEN PHARMD $RemoveBefore'@1113'[id number]$  02/04/20 EB    Proteus species NOT DETECTED NOT DETECTED Final   Salmonella species NOT DETECTED NOT DETECTED Final   Serratia marcescens NOT DETECTED NOT DETECTED Final   Haemophilus influenzae NOT DETECTED NOT DETECTED Final   Neisseria meningitidis NOT DETECTED NOT DETECTED Final   Pseudomonas aeruginosa NOT DETECTED NOT DETECTED Final   Stenotrophomonas maltophilia NOT DETECTED NOT DETECTED Final   Candida albicans NOT DETECTED NOT DETECTED Final   Candida auris NOT DETECTED NOT DETECTED Final   Candida glabrata NOT DETECTED NOT DETECTED Final   Candida krusei NOT DETECTED NOT DETECTED Final   Candida parapsilosis NOT DETECTED NOT DETECTED Final   Candida tropicalis NOT DETECTED NOT DETECTED Final   Cryptococcus neoformans/gattii NOT DETECTED NOT DETECTED Final   CTX-M ESBL NOT DETECTED NOT DETECTED Final   Carbapenem resistance IMP NOT DETECTED NOT DETECTED Final   Carbapenem resistance KPC NOT DETECTED NOT DETECTED Final   Carbapenem resistance NDM NOT DETECTED NOT DETECTED Final   Carbapenem resist OXA 48 LIKE NOT DETECTED NOT DETECTED Final   Carbapenem resistance VIM NOT DETECTED NOT DETECTED Final    Comment: Performed at [facility], [address], [place], [place] [address]  C Difficile Quick Screen w PCR reflex     Status: None   Collection Time: 02/04/20  5:07 PM   Specimen: STOOL  Result Value Ref Range Status   C Diff antigen  NEGATIVE NEGATIVE Final   C Diff toxin NEGATIVE NEGATIVE Final   C Diff interpretation No C. difficile detected.  Final    Comment: Performed at [facility], [place] [address] [place], [place] [address]         Radiology Studies: US RENAL  Result Date: 02/05/2020 CLINICAL DATA:  Acute kidney injury EXAM: RENAL / URINARY TRACT ULTRASOUND COMPLETE COMPARISON:  09/21/2019 FINDINGS: Right Kidney: Renal measurements: 11.3 x 5.5 x 5.7 cm = volume: 185 mL. Echogenicity within normal limits. No mass or hydronephrosis visualized. Left Kidney: Renal measurements: 13.1 x 6 x 5.2 cm = volume: 212 mL. Echogenicity within normal limits. No mass or hydronephrosis visualized. Bladder: Appears normal for degree of bladder distention. Other: Incidental note made of echogenic hepatic parenchyma. IMPRESSION: 1. Normal ultrasound appearance of the kidneys 2. Liver appears slightly echogenic suggesting steatosis Electronically Signed   By: Donavan Foil M.D.   On: 02/05/2020 18:46        Scheduled Meds: . sodium chloride   Intravenous Once  . atorvastatin  40 mg Oral QPM  . Chlorhexidine Gluconate Cloth  6 each Topical Daily  . DULoxetine  60 mg Oral Daily  . oxybutynin  10 mg Oral QHS  . pyridOXINE  200 mg Oral Q breakfast  . saccharomyces boulardii  250 mg Oral BID  . Tbo-Filgrastim  480 mcg Subcutaneous q1800   Continuous Infusions: . sodium chloride 125 mL/hr at 02/06/20 0742  . cefTRIAXone (ROCEPHIN)  IV 2 g (02/05/20 1428)     LOS: 3 days    Time spent: 35 mins.,More than 50% of that time was spent in counseling and/or coordination of care.      Shelly Coss, MD [facility] P10/27/2021, 8:18 AM

## 2020-02-06 NOTE — Care Management Important Message (Signed)
Important Message  Patient Details IM Letter given to the Patient Name: [name] Rose MRN: [id number] Date of Birth: 03-10-1951   Medicare Important Message Given:  Yes     Kerin Salen 02/06/2020, 10:25 AM

## 2020-02-06 NOTE — TOC Progression Note (Addendum)
Transition of Care [facility]) - Progression Note    Patient Details  Name: [name] Rose MRN: [id number] Date of Birth: 06-Sep-1950  Transition of Care [facility]) CM/SW Contact  Purcell Mouton, RN Phone Number: 02/06/2020, 3:57 PM  Clinical Narrative:    Spoke with pt's wife concerning discharge plans and HH. Mrs. Krus agreed with [facility], when pt  Discharge home. Will need HHPT/OT/RN orders.    Expected Discharge Plan: Home/Self Care Barriers to Discharge: No Barriers Identified  Expected Discharge Plan and Services Expected Discharge Plan: Home/Self Care       Living arrangements for the past 2 months: Single Family Home                                       Social Determinants of Health (SDOH) Interventions    Readmission Risk Interventions Readmission Risk Prevention Plan 09/26/2019 09/08/2018 06/28/2018  Transportation Screening Complete Complete Complete  PCP or Specialist Appt within 3-5 Days Complete - Complete  HRI or Home Care Consult Complete - Complete  Social Work Consult for [place] Planning/Counseling Complete - Complete  Palliative Care Screening Not Applicable - Not Applicable  Medication Review Press photographer) Complete Complete Complete  PCP or Specialist appointment within 3-5 days of discharge - Not Complete -  PCP/Specialist Appt Not Complete comments - not yet ready for d/c -  HRI or [place] - Not Complete -  [place] or [place] Pt Refusal Comments - no needs identified at this time -  SW Recovery Care/Counseling Consult - Not Complete -  SW Consult Not Complete Comments - no needs identified at this time -  Palliative Care Screening - Not Applicable -  [place] - Not Applicable -  Some recent data might be hidden

## 2020-02-06 NOTE — Evaluation (Signed)
Occupational Therapy Evaluation Patient Details Name: [name] Rose MRN: [id number] DOB: 02/21/1951 Today's Date: 02/06/2020    History of Present Illness 69 yo male admitted with FTT, pancytopenia, thrombocytopenia, sepsis, weakness. hx of met prostate ca with bladder mets, Afib, CKD   Clinical Impression   [name] Rose is a 69 year old man who presents with generalized weakness and decreased activity tolerance resulting in a decline in functional abilities. Patient requiring assistance with lower body ADLs, set up and seated position for UB ADLs and min guard for transfer to recliner. Patient reports feeling weak and tired. Also limited by frequent diarrhea, limited nutrition and reporting "feels like I might upchuck." Patient will benefit from skilled OT services while in hospital in order to improve deficits and learn compensatory strategies as needed in order to return to PLOF.      Follow Up Recommendations  Home health OT    Equipment Recommendations  None recommended by OT    Recommendations for Other Services       Precautions / Restrictions Precautions Precautions: Fall Restrictions Weight Bearing Restrictions: No      Mobility Bed Mobility Overal bed mobility: Needs Assistance Bed Mobility: Supine to Sit     Supine to sit: HOB elevated;Supervision     General bed mobility comments: Increased time and effort for pt. reliant on bed rail.    Transfers Overall transfer level: Needs assistance Equipment used: Rolling walker (2 wheeled) Transfers: Sit to/from [facility] Sit to Stand: Min guard;From elevated surface Stand pivot transfers: Min guard;From elevated surface       General transfer comment: min guard to stand from elevated surface. Min guard to take steps to recliner. Poor activity tolerance - patient reporting feeling weak and tired.    Balance Overall balance assessment: No apparent balance deficits (not formally  assessed)                                         ADL either performed or assessed with clinical judgement   ADL Overall ADL's : Needs assistance/impaired Eating/Feeding: Set up;Sitting   Grooming: Sitting;Oral care;Wash/dry face   Upper Body Bathing: Set up;Sitting   Lower Body Bathing: Maximal assistance;Sit to/from stand   Upper Body Dressing : Set up;Sitting   Lower Body Dressing: Maximal assistance;Sit to/from stand Lower Body Dressing Details (indicate cue type and reason): max assist to donn socks and Depends Toilet Transfer: Min guard;RW;Stand-pivot;BSC   Toileting- Clothing Manipulation and Hygiene: Maximal assistance;Sit to/from stand       Functional mobility during ADLs: Min guard;Rolling walker       Vision Patient Visual Report: No change from baseline       Perception     Praxis      Pertinent Vitals/Pain Pain Assessment: No/denies pain     Hand Dominance Right   Extremity/Trunk Assessment Upper Extremity Assessment Upper Extremity Assessment: Overall WFL for tasks assessed   Lower Extremity Assessment Lower Extremity Assessment: Defer to PT evaluation   Cervical / Trunk Assessment Cervical / Trunk Assessment: Normal   Communication Communication Communication: No difficulties   Cognition Arousal/Alertness: Awake/alert Behavior During Therapy: WFL for tasks assessed/performed Overall Cognitive Status: Within Functional Limits for tasks assessed  General Comments       Exercises     Shoulder Instructions      Home Living Family/patient expects to be discharged to:: Private residence Living Arrangements: Spouse/significant other Available Help at Discharge: Family Type of Home: House Home Access: Stairs to enter Technical brewer of Steps: 1 Entrance Stairs-Rails: None Home Layout: One level     Bathroom Shower/Tub: Occupational psychologist:  Handicapped height Bathroom Accessibility: No   Home Equipment: Environmental consultant - 2 wheels;Bedside commode;Shower seat;Cane - single point;Wheelchair - manual          Prior Functioning/Environment Level of Independence: Independent                 OT Problem List: Decreased activity tolerance;Decreased strength      OT Treatment/Interventions: Therapeutic exercise;Patient/family education;Therapeutic activities    OT Goals(Current goals can be found in the care plan section) Acute Rehab OT Goals Patient Stated Goal: regain strength. OT Goal Formulation: With patient Time For Goal Achievement: 02/20/20 Potential to Achieve Goals: Good  OT Frequency: Min 2X/week   Barriers to D/C:            Co-evaluation              AM-PAC OT "6 Clicks" Daily Activity     Outcome Measure Help from another person eating meals?: A Little Help from another person taking care of personal grooming?: A Little Help from another person toileting, which includes using toliet, bedpan, or urinal?: A Lot Help from another person bathing (including washing, rinsing, drying)?: A Lot Help from another person to put on and taking off regular upper body clothing?: A Little Help from another person to put on and taking off regular lower body clothing?: A Lot 6 Click Score: 15   End of Session Equipment Utilized During Treatment: Surveyor, mining Communication:  (okay to see per RN)  Activity Tolerance: Patient limited by fatigue Patient left: in chair;with call bell/phone within reach;with family/visitor present  OT Visit Diagnosis: Muscle weakness (generalized) (M62.81)                Time: 6803-2122 OT Time Calculation (min): 38 min Charges:  OT General Charges $OT Visit: 1 Visit OT Evaluation $OT Eval Moderate Complexity: 1 Mod OT Treatments $Self Care/Home Management : 8-22 mins  Terrence Pizana, OTR/L Acute Care Rehab Services  Office [phone] Pager: [phone]   Lenward Chancellor 02/06/2020, 12:33 PM

## 2020-02-06 NOTE — Progress Notes (Signed)
Overall, [name] Rose is about the same.  Hopefully, his white cell count is starting to come up.  His white cell count is 0.3.  He did get a platelet transfusion yesterday.  His platelet count today is 24,000.  There is no hematuria.  His hemoglobin is stable at 10.  He does have a little bit of high blood pressure today.  I am unsure as to why his blood pressure would be on the high side.  He has a Klebsiella in his blood.  He is on Rocephin.  This is certainly can be given that this is once a day dosing.  He still not eating much.  He just does not have much of an appetite right now.  He has no mouth sores.  He is still having some diarrhea.  We switched him over to Lomotil.  Hopefully the diarrhea will improve with improvement of his white cell count.  He may need some probiotic.  Unfortunately, his renal function seems to be a little worse.  His BUN is 58 creatinine 4.15.  His calcium is 8.  Potassium 3.2.  On his physical exam, his temperature is 97.7.  Pulse 94.  Blood pressure 135/104.  His oral exam shows no mucositis.  Lungs are clear bilaterally.  He has no wheezes.  Cardiac exam somewhat tachycardic and irregular.  The heart monitor does show atrial fibrillation.  Abdomen is soft.  Bowel sounds are present.  There is no guarding or rebound tenderness.  Extremities show some chronic mild edema in his legs.  Skin exam shows no petechia or ecchymoses.  Neurological exam is nonfocal.  He is in atrial fibrillation again.  He is off anticoagulation because of the thrombocytopenia.  I think once his platelet count gets above 30,000, then he can probably restart anticoagulation.  I just worry about the renal function.  [name], MD  1 Chronicles 16:34

## 2020-02-06 NOTE — Progress Notes (Signed)
Notified MD Adhakari around 1050 this morning that patients HR was maintaining between 130's-140's. Patient sitting up in chair appears to not be feeling well and is diaphoretic. Vitals checked and added to chart. Patient also c/o of uncontrollable hiccups, MD aware. New orders to transfer to progressive care for a Cardizem drip. Awaiting bed availability at this time, will continue to monitor patient.

## 2020-02-06 NOTE — Progress Notes (Signed)
Report called and given to Iris, RN on [facility] Progressive unit.

## 2020-02-06 NOTE — TOC Progression Note (Signed)
Transition of Care [facility]) - Progression Note    Patient Details  Name: [name] Rose MRN: [id number] Date of Birth: 1950/09/25  Transition of [facility]) CM/SW Contact  Purcell Mouton, RN Phone Number: 02/06/2020, 3:53 PM  Clinical Narrative:     Pt from home with spouse. TOC will continue to follow.  Expected Discharge Plan: Home/Self Care Barriers to Discharge: No Barriers Identified  Expected Discharge Plan and Services Expected Discharge Plan: Home/Self Care       Living arrangements for the past 2 months: Single Family Home                                       Social Determinants of Health (SDOH) Interventions    Readmission Risk Interventions Readmission Risk Prevention Plan 09/26/2019 09/08/2018 06/28/2018  Transportation Screening Complete Complete Complete  PCP or Specialist Appt within 3-5 Days Complete - Complete  HRI or Home Care Consult Complete - Complete  Social Work Consult for [place] Planning/Counseling Complete - Complete  Palliative Care Screening Not Applicable - Not Applicable  Medication Review Press photographer) Complete Complete Complete  PCP or Specialist appointment within 3-5 days of discharge - Not Complete -  PCP/Specialist Appt Not Complete comments - not yet ready for d/c -  HRI or [place] - Not Complete -  [place] or [place] Pt Refusal Comments - no needs identified at this time -  SW Recovery Care/Counseling Consult - Not Complete -  SW Consult Not Complete Comments - no needs identified at this time -  Palliative Care Screening - Not Applicable -  [place] - Not Applicable -  Some recent data might be hidden

## 2020-02-06 NOTE — Progress Notes (Signed)
Pt new transfer to floor. Placed on tele monitor. Will review orders and continue to monitor patients.

## 2020-02-06 NOTE — Consult Note (Addendum)
Renal Service Consult Note [name]  [name] Rose 02/06/2020 [name] Blazing, MD Requesting Physician:  Dr Tawanna Solo, A.  Reason for Consult:  Renal failure HPI: The patient is a 69 y.o. year-old w/ hx of atrial fib, HTN, CKD 4 and bladder cancer w/ mets on chemoRx presented on 10/24 w/ gen'd weakness, diarrhea for 5 days, no appetite or fevers. +having falls, imbalance on standing. In ED pt was afeb, BP soft 99/70, WBC 0.21, creat 2.55, plt 30.  Seen by ONC, got Granix, getting plts today , sp 2u prbc. Pt blood cx's grew klebs pna and pt is on IVF's and IV abx.  Creat on admit 2.5 >> up to 3.5 yest and 4.1 today.  Making urine 550 cc yest and 675 cc so far today. Asked to see for renal failure.      Baseline creat is 1.9- 2.6 from 2021, eGFR 26- 34 ml/min. Pt no c/o's, no abd pain or voiding issues. Was passing some blood in urine (chronic issue off and on) but that has "cleared up".  No SOB, CP or n/v.  No confusion.    ROS  denies CP  no joint pain   no HA  no blurry vision  no rash     Past Medical History  Past Medical History:  Diagnosis Date  . Anemia   . Arthritis   . 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 ([place]) 09/29/2016  . Bladder tumor   . Chronic kidney disease    ckd stage 3, sees dr every 6 months, arf hemodialysis done x 2 2020  . Dyspnea    with exertion  . Dysrhythmia    atrial fib  . Essential hypertension 06/23/2018  . Goals of care, counseling/discussion 09/30/2016  . History of prostate cancer followed by pcp dr [name]-  per pt last PSA undetectable   dx 2008-- (Stage T1c, Gleason 3+3,  PSA 4.58, vol 99cc)  s/p  radical prostatectomy (nerve sparing bilateral)   . Hyperglycemia 06/23/2018  . Hypertension   . Mild hyperlipidemia 06/23/2018  . Pre-diabetes   . Wears glasses    Past Surgical History  Past Surgical History:  Procedure Laterality Date  . CARDIOVERSION N/A 05/18/2019   Procedure: CARDIOVERSION;   Surgeon: Lelon Perla, MD;  Location: [facility] ENDOSCOPY;  Service: Cardiovascular;  Laterality: N/A;  . CARDIOVERSION N/A 09/27/2019   Procedure: CARDIOVERSION;  Surgeon: Donato Heinz, MD;  Location: [place];  Service: Cardiovascular;  Laterality: N/A;  . CATARACT EXTRACTION W/ INTRAOCULAR LENS  IMPLANT, BILATERAL Bilateral 2011  . [place]  . IR FLUORO GUIDE PORT INSERTION RIGHT  10/07/2016  . IR RADIOLOGIST EVAL & MGMT  01/05/2018  . IR US GUIDE VASC ACCESS RIGHT  10/07/2016  . KNEE ARTHROSCOPY Bilateral right 2006;  left 02-15-2007  . [place];  1990;  1983  . ROBOT ASSISTED LAPAROSCOPIC RADICAL PROSTATECTOMY  06/20/2006   bilateral nerve sparing  . TEE WITHOUT CARDIOVERSION N/A 06/27/2018   Procedure: TRANSESOPHAGEAL ECHOCARDIOGRAM (TEE);  Surgeon: Nigel Mormon, MD;  Location: [facility] ENDOSCOPY;  Service: Cardiovascular;  Laterality: N/A;  . TOTAL HIP ARTHROPLASTY Left 03/03/2015   Procedure: LEFT TOTAL HIP ARTHROPLASTY ANTERIOR APPROACH;  Surgeon: Dorna Leitz, MD;  Location: [place];  Service: Orthopedics;  Laterality: Left;  . TOTAL KNEE ARTHROPLASTY Bilateral left 08-13-2009;  right 12-26-2009  . TRANSURETHRAL RESECTION OF BLADDER TUMOR N/A 09/20/2016   Procedure: TRANSURETHRAL RESECTION OF BLADDER TUMOR (TURBT);  Surgeon: Franchot Gallo, MD;  Location: [facility];  Service: Urology;  Laterality: N/A;  . TRANSURETHRAL RESECTION OF BLADDER TUMOR WITH MITOMYCIN-C N/A 08/06/2019   Procedure: TRANSURETHRAL RESECTION OF BLADDER TUMOR;  Surgeon: Franchot Gallo, MD;  Location: [facility];  Service: Urology;  Laterality: N/A;   Family History  Family History  Problem Relation Age of Onset  . Hypertension Mother   . Aneurysm Mother   . Emphysema Father   . Hypertension Father    Social History  reports that he quit smoking about 35 years ago. His smoking use included cigarettes. He has a 16.00  pack-year smoking history. He has never used smokeless tobacco. He reports current alcohol use. He reports current drug use. Drug: Marijuana. Allergies No Known Allergies Home medications Prior to Admission medications   Medication Sig Start Date End Date Taking? Authorizing Provider  acetaminophen (TYLENOL) 500 MG tablet Take 1,000 mg by mouth every 6 (six) hours as needed for mild pain or headache.   Yes [provider]  apixaban (ELIQUIS) 5 MG TABS tablet Take 1 tablet (5 mg total) by mouth 2 (two) times daily. 03/19/19  Yes Volanda Napoleon, MD  atorvastatin (LIPITOR) 40 MG tablet Take 40 mg by mouth every evening.    Yes [provider]  Cholecalciferol (VITAMIN D-3 PO) Take 1 capsule by mouth daily with breakfast.   Yes [provider]  diltiazem (CARDIZEM CD) 360 MG 24 hr capsule TAKE 1 CAPSULE BY MOUTH EVERY DAY Patient taking differently: Take 360 mg by mouth daily.  09/21/19  Yes Hilty, Nadean Corwin, MD  dofetilide (TIKOSYN) 250 MCG capsule Take 1 capsule (250 mcg total) by mouth 2 (two) times daily. 09/28/19  Yes Shirley Friar, PA-C  DULoxetine (CYMBALTA) 60 MG capsule TAKE ONE CAPSULE BY MOUTH DAILY 01/21/20  Yes Ennever, Rudell Cobb, MD  gabapentin (NEURONTIN) 400 MG capsule TAKE 1 CAPSULE (400 MG TOTAL) BY MOUTH 4 (FOUR) TIMES DAILY. Patient taking differently: Take 800 mg by mouth at bedtime.  09/11/19  Yes Volanda Napoleon, MD  lidocaine-prilocaine (EMLA) cream Apply to affected area once 12/31/19  Yes Ennever, Rudell Cobb, MD  loperamide (IMODIUM A-D) 2 MG tablet Take 2 at diarrhea onset, then 1 every 2hr until 12hrs with no BM. May take 2 every 4hrs at night. If diarrhea recurs repeat. 12/31/19  Yes Volanda Napoleon, MD  magnesium oxide (MAG-OX) 400 MG tablet Take 1 tablet (400 mg total) by mouth 2 (two) times daily. 10/05/19  Yes Sherran Needs, NP  metoprolol succinate (TOPROL-XL) 50 MG 24 hr tablet TAKE ONE TABLET BY MOUTH EVERY MORNING AND TAKE ONE TABLET  BY MOUTH EVERY NIGHT AT BEDTIME Patient taking differently: Take 50 mg by mouth 2 (two) times daily.  01/01/20  Yes Sherran Needs, NP  oxybutynin (DITROPAN-XL) 10 MG 24 hr tablet Take 10 mg by mouth at bedtime.   Yes [provider]  potassium chloride (KLOR-CON) 10 MEQ tablet Take 2 tablets (20 mEq total) by mouth daily. 12/31/19 03/30/20 Yes Baldwin Jamaica, PA-C  prochlorperazine (COMPAZINE) 10 MG tablet Take 1 tablet (10 mg total) by mouth every 6 (six) hours as needed (Nausea or vomiting). Patient taking differently: Take 10 mg by mouth as needed (Nausea or vomiting).  12/31/19  Yes Volanda Napoleon, MD  pyridoxine (B-6) 100 MG tablet Take 200 mg by mouth daily with breakfast.    Yes [provider]  amoxicillin (AMOXIL) 500 MG capsule  TAKE 4 CAPS (2,000 MG TOTAL) BY MOUTH ONCE FOR 1 DOSE. TAKE 1 HOUR PRIOR TO DENTAL PROCEDURE 08/31/19   Volanda Napoleon, MD  cyclobenzaprine (FEXMID) 7.5 MG tablet Take 1 tablet (7.5 mg total) by mouth 3 (three) times daily as needed for muscle spasms. Patient not taking: Reported on 01/28/2020 12/31/19   Volanda Napoleon, MD  dexamethasone (DECADRON) 4 MG tablet Take 2 tablets (8 mg) daily for 3 days after chemotherapy. Take with food. 12/31/19   Volanda Napoleon, MD  K Phos Mono-Sod Phos Di & Mono (K-PHOS-NEUTRAL) [phone] MG TABS Take 1 tablet by mouth 2 (two) times daily. Patient not taking: Reported on 01/28/2020 11/22/19   [provider]  LORazepam (ATIVAN) 0.5 MG tablet Take 1 tablet (0.5 mg total) by mouth every 6 (six) hours as needed (Nausea or vomiting). Patient not taking: Reported on 02/03/2020 12/31/19   Volanda Napoleon, MD  traMADol (ULTRAM) 50 MG tablet Take 50 mg by mouth daily as needed (for pain).  Patient not taking: Reported on 02/03/2020 03/16/19   [provider]     Vitals:   02/05/20 1327 02/05/20 2056 02/06/20 0455 02/06/20 1054  BP: 109/67 123/74 (!) 135/104 115/81  Pulse: 87 92 94 76  Resp:   18 18   Temp: 98 F (36.7 C) 98.3 F (36.8 C) 97.7 F (36.5 C)   TempSrc: Oral Oral Oral   SpO2: 100% 97% 97% 97%  Weight:      Height:       Exam Gen alert, lying flat, calm, dry mouth No rash, cyanosis or gangrene Sclera anicteric, throat dry  No jvd or bruits Chest clear bilat to bases no rales or wheezing irreg irreg no MRG, tachy Abd soft ntnd no mass or ascites +bs GU normal male MS no joint effusions or deformity Ext no leg or UE edema, no wounds or ulcers  Neuro is alert, Ox 3 , nf, no asterixis    Home meds:  - lipitor/ cardizem cd 360/ toprol xl 50 bid/ Kdur  - tikosyn bid/ eliquis 5 bid  - prn ultram/ ativan/ imodium  - neurontin 800 hs/ cymbalta qd  - prn's/ vitamins/ supplements    UA 10/24 - 100 prot, >50 rbc, 0-5 wbc, 0-5 epi    UNa 10/26 - 22,  UCr 114    Renal US - 11.3/ 13.1 cm kidneys, no ^echo and no hydro bilat   Assessment/ Plan: 1. AoCKD 4 - Baseline creat is 1.9- 2.6 from 2021, eGFR 26- 34 ml/min. Creat 2.5 on admit , 3.5 yest and 4.1 today.  Good UOP. Looks dry on exam, getting IVF"s.  BP's were low on admission. Suspect AKI due to sepsis/ hypoperfusion and hypovolemia. Looks dry on exam.  BP's have returned to low-normal.  Will bolus 1.5- 2 L today, cont IVF's.  Suspect will recover within 1-2 days. Will follow.  2. Klebs pna sepsis - BP's wnl now, on IV abx, not sure source, possibly just d/t neutropenia. 3. Neutropenia/ pancytopenia - per pmd, ONC 4. Atrial fib - home meds are on hold for now given hypotension 5. Metastatic bladder cancer - per pmd      Kelly Splinter  MD 02/06/2020, 12:46 PM  Recent Labs  Lab 02/05/20 0500 02/06/20 0515  WBC 0.2* 0.3*  HGB 9.6* 10.0*   Recent Labs  Lab 02/05/20 0500 02/06/20 0515  K 3.1* 3.2*  BUN 55* 58*  CREATININE 3.55* 4.15*  CALCIUM 7.8* 8.0*

## 2020-02-07 ENCOUNTER — Inpatient Hospital Stay (HOSPITAL_COMMUNITY): Payer: PPO

## 2020-02-07 DIAGNOSIS — I10 Essential (primary) hypertension: Secondary | ICD-10-CM

## 2020-02-07 DIAGNOSIS — D696 Thrombocytopenia, unspecified: Secondary | ICD-10-CM

## 2020-02-07 DIAGNOSIS — R627 Adult failure to thrive: Secondary | ICD-10-CM | POA: Diagnosis not present

## 2020-02-07 DIAGNOSIS — I4819 Other persistent atrial fibrillation: Secondary | ICD-10-CM

## 2020-02-07 LAB — BASIC METABOLIC PANEL
Anion gap: 13 (ref 5–15)
BUN: 69 mg/dL — ABNORMAL HIGH (ref 8–23)
CO2: 17 mmol/L — ABNORMAL LOW (ref 22–32)
Calcium: 8.2 mg/dL — ABNORMAL LOW (ref 8.9–10.3)
Chloride: 102 mmol/L (ref 98–111)
Creatinine, Ser: 4.75 mg/dL — ABNORMAL HIGH (ref 0.61–1.24)
GFR, Estimated: 13 mL/min — ABNORMAL LOW (ref >60)
Glucose, Bld: 175 mg/dL — ABNORMAL HIGH (ref 70–99)
Potassium: 4 mmol/L (ref 3.5–5.1)
Sodium: 132 mmol/L — ABNORMAL LOW (ref 135–145)

## 2020-02-07 LAB — CBC WITH DIFFERENTIAL/PLATELET
Abs Immature Granulocytes: 0.03 10*3/uL (ref 0.00–0.07)
Basophils Absolute: 0 10*3/uL (ref 0.0–0.1)
Basophils Relative: 0 %
Eosinophils Absolute: 0 10*3/uL (ref 0.0–0.5)
Eosinophils Relative: 5 %
HCT: 30.2 % — ABNORMAL LOW (ref 39.0–52.0)
Hemoglobin: 10.3 g/dL — ABNORMAL LOW (ref 13.0–17.0)
Immature Granulocytes: 4 %
Lymphocytes Relative: 20 %
Lymphs Abs: 0.2 10*3/uL — ABNORMAL LOW (ref 0.7–4.0)
MCH: 33.4 pg (ref 26.0–34.0)
MCHC: 34.1 g/dL (ref 30.0–36.0)
MCV: 98.1 fL (ref 80.0–100.0)
Monocytes Absolute: 0.2 10*3/uL (ref 0.1–1.0)
Monocytes Relative: 24 %
Neutro Abs: 0.3 10*3/uL — CL (ref 1.7–7.7)
Neutrophils Relative %: 47 %
Platelets: 23 10*3/uL — CL (ref 150–400)
RBC: 3.08 MIL/uL — ABNORMAL LOW (ref 4.22–5.81)
RDW: 14.6 % (ref 11.5–15.5)
WBC: 0.7 10*3/uL — CL (ref 4.0–10.5)
nRBC: 0 % (ref 0.0–0.2)

## 2020-02-07 LAB — GLUCOSE, CAPILLARY
Glucose-Capillary: 147 mg/dL — ABNORMAL HIGH (ref 70–99)
Glucose-Capillary: 158 mg/dL — ABNORMAL HIGH (ref 70–99)
Glucose-Capillary: 153 mg/dL — ABNORMAL HIGH (ref 70–99)
Glucose-Capillary: 141 mg/dL — ABNORMAL HIGH (ref 70–99)

## 2020-02-07 LAB — MAGNESIUM: Magnesium: 2.3 mg/dL (ref 1.7–2.4)

## 2020-02-07 MED ORDER — METOPROLOL SUCCINATE ER 25 MG PO TB24: 25.0000 mg | ORAL_TABLET | Freq: Once | ORAL | Status: DC

## 2020-02-07 MED ORDER — METOPROLOL SUCCINATE ER 25 MG PO TB24: 25.0000 mg | ORAL_TABLET | Freq: Every evening | ORAL | Status: DC

## 2020-02-07 MED ORDER — METOCLOPRAMIDE HCL 5 MG/ML IJ SOLN
10.0000 mg | Freq: Three times a day (TID) | INTRAMUSCULAR | Status: DC
  Administered 2020-02-07: 10 mg via INTRAVENOUS
  Filled 2020-02-07: qty 2

## 2020-02-07 MED ORDER — METOPROLOL SUCCINATE ER 50 MG PO TB24: 75.0000 mg | ORAL_TABLET | Freq: Every morning | ORAL | Status: DC

## 2020-02-07 MED ORDER — IOHEXOL 9 MG/ML PO SOLN
500.0000 mL | ORAL | Status: AC
  Administered 2020-02-07: 500 mL via ORAL

## 2020-02-07 MED ORDER — IOHEXOL 9 MG/ML PO SOLN
ORAL | Status: AC
  Filled 2020-02-07: qty 1000

## 2020-02-07 MED ORDER — METOCLOPRAMIDE HCL 5 MG/ML IJ SOLN: 20.0000 mg | Freq: Four times a day (QID) | INTRAVENOUS | Status: DC

## 2020-02-07 NOTE — Progress Notes (Signed)
MD notified of central monitoring report of 5 beat run ov v tach. Pt asymptomatic.

## 2020-02-07 NOTE — Progress Notes (Signed)
Bladder scan completed showing 18ml urine in the bladder.

## 2020-02-07 NOTE — Progress Notes (Signed)
[facility] Progress Note   69 y.o. year-old afib, HTN, CKD 4 baseline creat is 1.9- 2.6 from 2021and bladder cancer w/ mets on chemoRx presented on 10/24 w/ gen'd weakness, diarrhea for 5 days, no appetite or fevers.  BP soft 99/70, WBC 0.21, creat 2.55, plt 30.  Seen by ONC, got Granix, getting plts today , sp 2u prbc. Pt blood cx's grew klebs pna and pt is on IVF's and IV abx.  Creat on admit 2.5 >> up to 3.5 ->  4.1.  Urine output trend going the wrong direction. Was on dialysis in June 2021 temporarily as well.   Assessment/ Plan:   1. AoCKD 4 - Baseline creat is 1.9- 2.6 from 2021, eGFR 26- 34 ml/min. Creat 2.5 on admit and has steadily increased despite aggressive isotonic resuscitation.  AKI likely due to sepsis/ hypoperfusion.  - Will decrease IVF to 81m/hr and hopefully he starts to turn around soon. Almost certainly in ATN and he has required dialysis in 09/2019 as well. If he doesn't recover in the next 24-48hr or if the symptoms of nausea/ dysgeusia worsen then he will  [facility] 2. Klebs pna sepsis - BP's wnl now, on IV abx, not sure source, possibly just d/t neutropenia. 3. Neutropenia/ pancytopenia - per pmd, ONC 4. Atrial fib - home meds are on hold for now given hypotension 5. Metastatic bladder cancer - per pmd  Subjective:   Nausea, minimal dyspnea. Denies f/c/myalgias/ thirst   Objective:   BP 121/78 (BP Location: Left Arm)   Pulse 95   Temp 97.8 F (36.6 C)   Resp (!) 22   Ht _0  (1.88 m)   Wt 121.5 kg   SpO2 98%   BMI 34.38 kg/m   Intake/Output Summary (Last 24 hours) at 02/07/2020 1353 Last data filed at 02/07/2020 0900 Gross per 24 hour  Intake 3419.68 ml  Output 150 ml  Net 3269.68 ml   Weight change:   Physical Exam: Gen alert, lying flat, hiccups No jvd or bruits Chest clear bilat to bases  CV irreg irreg no MRG Abd soft ntnd no mass or ascites +bs Ext tr edema, no wounds or ulcers  Neuro is  alert, Ox 3 , nf, no asterixis  Imaging: UK[place]RENAL  Result Date: 02/05/2020 CLINICAL DATA:  Acute kidney injury EXAM: RENAL / URINARY TRACT ULTRASOUND COMPLETE COMPARISON:  09/21/2019 FINDINGS: Right Kidney: Renal measurements: 11.3 x 5.5 x 5.7 cm = volume: 185 mL. Echogenicity within normal limits. No mass or hydronephrosis visualized. Left Kidney: Renal measurements: 13.1 x 6 x 5.2 cm = volume: 212 mL. Echogenicity within normal limits. No mass or hydronephrosis visualized. Bladder: Appears normal for degree of bladder distention. Other: Incidental note made of echogenic hepatic parenchyma. IMPRESSION: 1. Normal ultrasound appearance of the kidneys 2. Liver appears slightly echogenic suggesting steatosis Electronically Signed   By: KDonavan FoilM.D.   On: 02/05/2020 18:46   DG Abd Portable 1V  Result Date: 02/07/2020 CLINICAL DATA:  Abdominal distension. EXAM: PORTABLE ABDOMEN - 1 VIEW COMPARISON:  12/20/2019 nuclear medicine PET. FINDINGS: Dilated small and large bowel loops. No pneumoperitoneum. No air-fluid levels. No suspicious calcific densities. Multilevel spondylosis. Sequela of left hip arthroplasty. IMPRESSION: Dilated small and large bowel loops, concerning for ileus versus obstruction. These results will be called to the ordering clinician or representative by the Radiologist Assistant, and communication documented in the PACS or [employer] Electronically Signed   By: CGeorga Kaufmann  [place] M.D.   On: 02/07/2020 08:10    Labs: BMET Recent Labs  Lab 02/03/20 1128 02/04/20 0539 02/05/20 0500 02/06/20 0515 02/07/20 0342  NA 130* 130* 131* 130* 132*  K 4.0 4.1 3.1* 3.2* 4.0  CL 99 99 101 100 102  CO2 21* 20* 19* 18* 17*  GLUCOSE 202* 186* 174* 155* 175*  BUN 43* 50* 55* 58* 69*  CREATININE 2.55* 3.49* 3.55* 4.15* 4.75*  CALCIUM 8.0* 8.4* 7.8* 8.0* 8.2*   CBC Recent Labs  Lab 02/03/20 1128 02/03/20 1128 02/04/20 0539 02/05/20 0500 02/06/20 0515 02/07/20 0342  WBC  0.2*   < > 0.2* 0.2* 0.3* 0.7*  NEUTROABS 0.0*  --   --   --   --  0.3*  HGB 8.6*   < > 7.7* 9.6* 10.0* 10.3*  HCT 26.0*   < > 22.3* 27.2* 28.3* 30.2*  MCV 102.4*   < > 100.0 93.8 95.3 98.1  PLT 30*   < > 15* 15* 24* 23*   < > = values in this interval not displayed.    Medications:    . sodium chloride   Intravenous Once  . atorvastatin  40 mg Oral QPM  . Chlorhexidine Gluconate Cloth  6 each Topical Daily  . diltiazem  360 mg Oral Daily  . iohexol      . metoprolol succinate  25 mg Oral QPM  . metoprolol succinate  25 mg Oral Once  . [START ON 02/08/2020] metoprolol succinate  75 mg Oral q AM  . oxybutynin  10 mg Oral QHS  . pyridOXINE  200 mg Oral Q breakfast  . saccharomyces boulardii  250 mg Oral BID  . Tbo-Filgrastim  480 mcg Subcutaneous q1800      Otelia Santee, MD 02/07/2020, 1:53 PM

## 2020-02-07 NOTE — Progress Notes (Addendum)
PROGRESS NOTE    [name] Rose  JAS:[id number] DOB: [birth year] DOA: 02/03/2020 PCP: Shirline Frees, MD   Chief Complain: Weakness  Brief Narrative: Patient is a 69 year old male with history of metastatic bladder cancer, currently on chemotherapy, paroxysmal A. fib, hypertension, CKD stage IV who presents to the emergency department complaints of generalized weakness and diarrhea for last 4 to 5 days.  He complains of decreased appetite but no abdominal pain, fevers or chills.  He has been falling frequently at home.  When EMS saw him, he was hypotensive.  On presentation he was hypotensive.  Lab work showed severe leukopenia, thrombocytopenia.  Patient was admitted for the management of sepsis secondary to neutropenia and started on cefepime.  Oncology consulted.  Blood cultures  showing Klebsiella pneumonia.  Hospital course remarkable for progressive AKI so nephrology consulted .  Cardiology also following.  Assessment & Plan:   Principal Problem:   Failure to thrive in adult Active Problems:   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 (HCC)   Diarrhea   Chronic a-fib (HCC)   Pancytopenia (HCC)   Fall at home, initial encounter   Pancytopenia: WBC 0.2, platelets of 30 on presentation.  Pancytopenia secondary to chemotherapy.  Oncology consulted and following.  Continue to monitor CBC.  Transfused with 2 units of PRBC during this hospitalization. He is also being given Granix. He has severe thrombocytopenia and was  transfused with platelets during this hopsitalization.  Gram-negative bacteremia/sepsis: Blood cultures now showed pansensitive  Klebsiella pneumonia.On  Cefazoline,will change it to oral to complete 2 weeks course when patient's overall status improves.  Continue cefazolin for now.  Blood pressure stable.  Continue IV fluids.  AKI on CKD stage IV: Baseline creatinine around 2.5.Creatinine bumped up to the range of 4  .  Continue   IV fluids.  He was  complaining of severe diarrhea from chemotherapy and it has been going on for several days.  He looked  volume depleted and dehydrated  .  AKI continues to worsen despite being given IV fluids.  Renal ultrasound did not show any abnormalities.  Urine sodium was 22.  Nephrology consulted and following.  He follows with [facility] as an outpatient.    Permanent  A. fib/A. fib with RVR: Was in A. fib on 02/06/2020.   Cardizem, Toprol at home which have been resumed .  On Eliquis for anticoagulation at home. Eliquis was held due to worsening thrombocytopenia.  We will restart when platelets are more than 30,000.  Tikosyn also held because of worsening kidney function.  Cardiology has been consulted and following  Diarrhea/Abd distension: Most likely associated with recent chemotherapy.  C. difficile negative.   Hospital course remarkable for severe diarrhea which has stopped but he has developed new abdominal distention.  Abdominal x-ray shows possible ileus.  Will check CT abdomen/pelvis without contrast.  Failure to thrive/multiple falls: Decreased appetite, falling frequently at home.    PT consulted.   Continue gentle IV fluids.  Metastatic bladder cancer: Follows with oncology, [name].  On chemotherapy.  He also underwent tumor resection by urology in the past.  Hypertension:   He was hypotensive earlier on presentation,we have resumed his home medications now.BP stable now  Addendum:  CT abdomen/pelvis without contrast showed diffuse dilation of small bowel loops throughout the abdomen to distal ileum.  Questionable obstruction.  Will start on conservative management with NG tube placement, intermittent suction.  If no improvement with conservative therapy, will consider for general surgery  evaluation.Lets keep NPO.will check KUB tomorow         DVT prophylaxis:SCD Code Status: Full Family Communication: wife at bedside on 10.27.21 Status is: Inpatient  Remains inpatient  appropriate because:Inpatient level of care appropriate due to severity of illness   Dispo: The patient is from: Home              Anticipated d/c is to: Home              Anticipated d/c date is: 3-4 days              Patient currently is not medically stable to d/c.     Consultants: oncology  Procedures:None  Antimicrobials:  Anti-infectives (From admission, onward)   Start     Dose/Rate Route Frequency Ordered Stop   02/06/20 1200  ceFAZolin (ANCEF) IVPB 2g/100 mL premix        2 g 200 mL/hr over 30 Minutes Intravenous Every 12 hours 02/06/20 1103     02/05/20 0600  ceFEPIme (MAXIPIME) 2 g in sodium chloride 0.9 % 100 mL IVPB  Status:  Discontinued        2 g 200 mL/hr over 30 Minutes Intravenous Every 24 hours 02/04/20 0719 02/04/20 1150   02/04/20 1400  cefTRIAXone (ROCEPHIN) 2 g in sodium chloride 0.9 % 100 mL IVPB  Status:  Discontinued        2 g 200 mL/hr over 30 Minutes Intravenous Every 24 hours 02/04/20 1150 02/06/20 1103   02/03/20 1830  ceFEPIme (MAXIPIME) 2 g in sodium chloride 0.9 % 100 mL IVPB  Status:  Discontinued        2 g 200 mL/hr over 30 Minutes Intravenous Every 12 hours 02/03/20 1825 02/04/20 0719      Subjective: Patient seen and examined at the bedside this morning.  Hemodynamically stable during my evaluation.  Heart rate is well controlled today.  He still complains of severe weakness.  Abdominal distention is a new problem.  Objective: Vitals:   02/06/20 1400 02/06/20 1442 02/06/20 2136 02/07/20 0513  BP: 125/79  121/78 118/70  Pulse: (!) 107 (!) 113 99 (!) 110  Resp:   20 20  Temp: 98 F (36.7 C)  97.7 F (36.5 C) 98.4 F (36.9 C)  TempSrc: Oral  Oral Oral  SpO2: 100% 100% 99% 96%  Weight:      Height:        Intake/Output Summary (Last 24 hours) at 02/07/2020 0819 Last data filed at 02/07/2020 0600 Gross per 24 hour  Intake 4356.35 ml  Output 575 ml  Net 3781.35 ml   Filed Weights   02/03/20 1058 02/03/20 1416  Weight:  122.5 kg 121.5 kg    Examination:   General exam: Weak, obese Respiratory system: Bilateral equal air entry, normal vesicular breath sounds, no wheezes or crackles  Cardiovascular system: Irregularly irregular rhythm. No JVD, murmurs, rubs, gallops or clicks. Gastrointestinal system: Abdomen is distended, soft and nontender. No organomegaly or masses felt.  Sluggish bowel sounds. Central nervous system: Alert and oriented. No focal neurological deficits. Extremities: No edema, no clubbing ,no cyanosis Skin: No rashes, lesions or ulcers,no icterus ,no pallor   Data Reviewed: I have personally reviewed following labs and imaging studies  CBC: Recent Labs  Lab 02/03/20 1128 02/04/20 0539 02/05/20 0500 02/06/20 0515 02/07/20 0342  WBC 0.2* 0.2* 0.2* 0.3* 0.7*  NEUTROABS 0.0*  --   --   --  0.3*  HGB 8.6* 7.7* 9.6* 10.0* 10.3*  HCT 26.0* 22.3* 27.2* 28.3* 30.2*  MCV 102.4* 100.0 93.8 95.3 98.1  PLT 30* 15* 15* 24* 23*   Basic Metabolic Panel: Recent Labs  Lab 02/03/20 1128 02/04/20 0539 02/05/20 0500 02/06/20 0515 02/07/20 0342  NA 130* 130* 131* 130* 132*  K 4.0 4.1 3.1* 3.2* 4.0  CL 99 99 101 100 102  CO2 21* 20* 19* 18* 17*  GLUCOSE 202* 186* 174* 155* 175*  BUN 43* 50* 55* 58* 69*  CREATININE 2.55* 3.49* 3.55* 4.15* 4.75*  CALCIUM 8.0* 8.4* 7.8* 8.0* 8.2*  MG 1.5*  --  1.7  --   --    GFR: Estimated Creatinine Clearance: 20.6 mL/min (A) (by C-G formula based on SCr of 4.75 mg/dL (H)). Liver Function Tests: Recent Labs  Lab 02/03/20 1128  AST 25  ALT 33  ALKPHOS 69  BILITOT 1.2  PROT 5.4*  ALBUMIN 3.0*   No results for input(s): LIPASE, AMYLASE in the last 168 hours. No results for input(s): AMMONIA in the last 168 hours. Coagulation Profile: No results for input(s): INR, PROTIME in the last 168 hours. Cardiac Enzymes: No results for input(s): CKTOTAL, CKMB, CKMBINDEX, TROPONINI in the last 168 hours. BNP (last 3 results) No results for input(s):  PROBNP in the last 8760 hours. HbA1C: No results for input(s): HGBA1C in the last 72 hours. CBG: Recent Labs  Lab 02/07/20 0746  GLUCAP 147*   Lipid Profile: No results for input(s): CHOL, HDL, LDLCALC, TRIG, CHOLHDL, LDLDIRECT in the last 72 hours. Thyroid Function Tests: No results for input(s): TSH, T4TOTAL, FREET4, T3FREE, THYROIDAB in the last 72 hours. Anemia Panel: No results for input(s): VITAMINB12, FOLATE, FERRITIN, TIBC, IRON, RETICCTPCT in the last 72 hours. Sepsis Labs: No results for input(s): PROCALCITON, LATICACIDVEN in the last 168 hours.  Recent Results (from the past 240 hour(s))  Respiratory Panel by RT PCR (Flu A&B, Covid) - Nasopharyngeal Swab     Status: None   Collection Time: 02/03/20 11:28 AM   Specimen: Nasopharyngeal Swab  Result Value Ref Range Status   SARS Coronavirus 2 by RT PCR NEGATIVE NEGATIVE Final    Comment: (NOTE) SARS-CoV-2 target nucleic acids are NOT DETECTED.  The SARS-CoV-2 RNA is generally detectable in upper respiratoy specimens during the acute phase of infection. The lowest concentration of SARS-CoV-2 viral copies this assay can detect is 131 copies/mL. A negative result does not preclude SARS-Cov-2 infection and should not be used as the sole basis for treatment or other patient management decisions. A negative result may occur with  improper specimen collection/handling, submission of specimen other than nasopharyngeal swab, presence of viral mutation(s) within the areas targeted by this assay, and inadequate number of viral copies (<131 copies/mL). A negative result must be combined with clinical observations, patient history, and epidemiological information. The expected result is Negative.  Fact Sheet for Patients:  PinkCheek.be  Fact Sheet for Healthcare Providers:  GravelBags.it  This test is no t yet approved or cleared by the [place] FDA and  has been  authorized for detection and/or diagnosis of SARS-CoV-2 by FDA under an Emergency Use Authorization (EUA). This EUA will remain  in effect (meaning this test can be used) for the duration of the COVID-19 declaration under Section 564(b)(1) of the Act, 21 U.S.C. section 360bbb-3(b)(1), unless the authorization is terminated or revoked sooner.     Influenza A by PCR NEGATIVE NEGATIVE Final   Influenza B by PCR NEGATIVE NEGATIVE Final    Comment: (NOTE) The Xpert Xpress  SARS-CoV-2/FLU/RSV assay is intended as an aid in  the diagnosis of influenza from Nasopharyngeal swab specimens and  should not be used as a sole basis for treatment. Nasal washings and  aspirates are unacceptable for Xpert Xpress SARS-CoV-2/FLU/RSV  testing.  Fact Sheet for Patients: PinkCheek.be  Fact Sheet for Healthcare Providers: GravelBags.it  This test is not yet approved or cleared by the [place] FDA and  has been authorized for detection and/or diagnosis of SARS-CoV-2 by  FDA under an Emergency Use Authorization (EUA). This EUA will remain  in effect (meaning this test can be used) for the duration of the  Covid-19 declaration under Section 564(b)(1) of the Act, 21  U.S.C. section 360bbb-3(b)(1), unless the authorization is  terminated or revoked. Performed at [facility], [place] [address], [place], [place] [address]   Culture, blood (x 2)     Status: Abnormal   Collection Time: 02/03/20  6:50 PM   Specimen: BLOOD RIGHT HAND  Result Value Ref Range Status   Specimen Description   Final    BLOOD RIGHT HAND Performed at [place] [address] [place], [place] [address]    Special Requests   Final    BOTTLES DRAWN AEROBIC ONLY Blood Culture results may not be optimal due to an inadequate volume of blood received in culture bottles Performed at [place] [address],  [place], [place] [address]    Culture  Setup Time   Final    GRAM NEGATIVE RODS AEROBIC BOTTLE ONLY CRITICAL VALUE NOTED.  VALUE IS CONSISTENT WITH PREVIOUSLY REPORTED AND CALLED VALUE.    Culture (A)  Final    KLEBSIELLA PNEUMONIAE SUSCEPTIBILITIES PERFORMED ON PREVIOUS CULTURE WITHIN THE LAST 5 DAYS. Performed at [facility], [place] [address], [place], [place] [address]    Report Status 02/06/2020 FINAL  Final  Culture, blood (x 2)     Status: Abnormal   Collection Time: 02/03/20  6:50 PM   Specimen: BLOOD  Result Value Ref Range Status   Specimen Description   Final    BLOOD RIGHT ANTECUBITAL Performed at [facility], [place] [address], [place], [place] [address]    Special Requests   Final    BOTTLES DRAWN AEROBIC ONLY Blood Culture results may not be optimal due to an excessive volume of blood received in culture bottles Performed at [place] [address], [place], [place] [address]    Culture  Setup Time   Final    GRAM NEGATIVE RODS AEROBIC BOTTLE ONLY CRITICAL RESULT CALLED TO, READ BACK BY AND VERIFIED WITH: T,GREEN PHARMD $RemoveBefore'@1113'[email or web address]$  02/04/20 EB Performed at [place] [address], [place], [place] [address]    Culture KLEBSIELLA PNEUMONIAE (A)  Final   Report Status 02/06/2020 FINAL  Final   Organism ID, Bacteria KLEBSIELLA PNEUMONIAE  Final      Susceptibility   Klebsiella pneumoniae - MIC*    AMPICILLIN >=32 RESISTANT Resistant     CEFAZOLIN <=4 SENSITIVE Sensitive     CEFEPIME <=0.12 SENSITIVE Sensitive     CEFTAZIDIME <=1 SENSITIVE Sensitive     CEFTRIAXONE <=0.25 SENSITIVE Sensitive     CIPROFLOXACIN <=0.25 SENSITIVE Sensitive     GENTAMICIN <=1 SENSITIVE Sensitive     IMIPENEM <=0.25 SENSITIVE Sensitive     TRIMETH/SULFA <=20 SENSITIVE Sensitive     AMPICILLIN/SULBACTAM 4 SENSITIVE Sensitive     PIP/TAZO <=4 SENSITIVE Sensitive     *  KLEBSIELLA PNEUMONIAE  Blood Culture ID Panel (Reflexed)     Status: Abnormal    Collection Time: 02/03/20  6:50 PM  Result Value Ref Range Status   Enterococcus faecalis NOT DETECTED NOT DETECTED Final   Enterococcus Faecium NOT DETECTED NOT DETECTED Final   Listeria monocytogenes NOT DETECTED NOT DETECTED Final   Staphylococcus species NOT DETECTED NOT DETECTED Final   Staphylococcus aureus (BCID) NOT DETECTED NOT DETECTED Final   Staphylococcus epidermidis NOT DETECTED NOT DETECTED Final   Staphylococcus lugdunensis NOT DETECTED NOT DETECTED Final   Streptococcus species NOT DETECTED NOT DETECTED Final   Streptococcus agalactiae NOT DETECTED NOT DETECTED Final   Streptococcus pneumoniae NOT DETECTED NOT DETECTED Final   Streptococcus pyogenes NOT DETECTED NOT DETECTED Final   A.calcoaceticus-baumannii NOT DETECTED NOT DETECTED Final   Bacteroides fragilis NOT DETECTED NOT DETECTED Final   Enterobacterales DETECTED (A) NOT DETECTED Final    Comment: Enterobacterales represent a large order of gram negative bacteria, not a single organism. CRITICAL RESULT CALLED TO, READ BACK BY AND VERIFIED WITH: T,GREEN PHARMD $RemoveBefore'@1113'[email or web address]$  02/04/20 EB    Enterobacter cloacae complex NOT DETECTED NOT DETECTED Final   Escherichia coli NOT DETECTED NOT DETECTED Final   Klebsiella aerogenes NOT DETECTED NOT DETECTED Final   Klebsiella oxytoca NOT DETECTED NOT DETECTED Final   Klebsiella pneumoniae DETECTED (A) NOT DETECTED Final    Comment: CRITICAL RESULT CALLED TO, READ BACK BY AND VERIFIED WITH: T,GREEN PHARMD $RemoveBefore'@1113'[email or web address]$  02/04/20 EB    Proteus species NOT DETECTED NOT DETECTED Final   Salmonella species NOT DETECTED NOT DETECTED Final   Serratia marcescens NOT DETECTED NOT DETECTED Final   Haemophilus influenzae NOT DETECTED NOT DETECTED Final   Neisseria meningitidis NOT DETECTED NOT DETECTED Final   Pseudomonas aeruginosa NOT DETECTED NOT DETECTED Final   Stenotrophomonas maltophilia NOT DETECTED NOT DETECTED Final   Candida albicans NOT DETECTED NOT DETECTED Final   Candida auris NOT  DETECTED NOT DETECTED Final   Candida glabrata NOT DETECTED NOT DETECTED Final   Candida krusei NOT DETECTED NOT DETECTED Final   Candida parapsilosis NOT DETECTED NOT DETECTED Final   Candida tropicalis NOT DETECTED NOT DETECTED Final   Cryptococcus neoformans/gattii NOT DETECTED NOT DETECTED Final   CTX-M ESBL NOT DETECTED NOT DETECTED Final   Carbapenem resistance IMP NOT DETECTED NOT DETECTED Final   Carbapenem resistance KPC NOT DETECTED NOT DETECTED Final   Carbapenem resistance NDM NOT DETECTED NOT DETECTED Final   Carbapenem resist OXA 48 LIKE NOT DETECTED NOT DETECTED Final   Carbapenem resistance VIM NOT DETECTED NOT DETECTED Final    Comment: Performed at [facility], [address], [place], [place] [address]  C Difficile Quick Screen w PCR reflex     Status: None   Collection Time: 02/04/20  5:07 PM   Specimen: STOOL  Result Value Ref Range Status   C Diff antigen NEGATIVE NEGATIVE Final   C Diff toxin NEGATIVE NEGATIVE Final   C Diff interpretation No C. difficile detected.  Final    Comment: Performed at [facility], [place] [address], [place], [place] [address]         Radiology Studies: US RENAL  Result Date: 02/05/2020 CLINICAL DATA:  Acute kidney injury EXAM: RENAL / URINARY TRACT ULTRASOUND COMPLETE COMPARISON:  09/21/2019 FINDINGS: Right Kidney: Renal measurements: 11.3 x 5.5 x 5.7 cm = volume: 185 mL. Echogenicity within normal limits. No mass or hydronephrosis visualized. Left Kidney: Renal measurements: 13.1 x 6 x 5.2 cm =  volume: 212 mL. Echogenicity within normal limits. No mass or hydronephrosis visualized. Bladder: Appears normal for degree of bladder distention. Other: Incidental note made of echogenic hepatic parenchyma. IMPRESSION: 1. Normal ultrasound appearance of the kidneys 2. Liver appears slightly echogenic suggesting steatosis Electronically Signed   By: Donavan Foil M.D.   On: 02/05/2020 18:46   DG Abd Portable  1V  Result Date: 02/07/2020 CLINICAL DATA:  Abdominal distension. EXAM: PORTABLE ABDOMEN - 1 VIEW COMPARISON:  12/20/2019 nuclear medicine PET. FINDINGS: Dilated small and large bowel loops. No pneumoperitoneum. No air-fluid levels. No suspicious calcific densities. Multilevel spondylosis. Sequela of left hip arthroplasty. IMPRESSION: Dilated small and large bowel loops, concerning for ileus versus obstruction. These results will be called to the ordering clinician or representative by the Radiologist Assistant, and communication documented in the PACS or [employer]. Electronically Signed   By: Primitivo Gauze M.D.   On: 02/07/2020 08:10        Scheduled Meds: . sodium chloride   Intravenous Once  . atorvastatin  40 mg Oral QPM  . Chlorhexidine Gluconate Cloth  6 each Topical Daily  . diltiazem  360 mg Oral Daily  . metoprolol succinate  50 mg Oral BID  . oxybutynin  10 mg Oral QHS  . pyridOXINE  200 mg Oral Q breakfast  . saccharomyces boulardii  250 mg Oral BID  . Tbo-Filgrastim  480 mcg Subcutaneous q1800   Continuous Infusions: . sodium chloride 125 mL/hr at 02/07/20 0228  .  ceFAZolin (ANCEF) IV 2 g (02/06/20 2246)     LOS: 4 days    Time spent: 35 mins.,More than 50% of that time was spent in counseling and/or coordination of care.      Shelly Coss, MD [facility] P10/28/2021, 8:19 AM

## 2020-02-07 NOTE — Plan of Care (Signed)

## 2020-02-07 NOTE — Consult Note (Signed)
Cardiology Consultation:   Patient ID: [name] Rose MRN: [id number]; DOB: 1950/10/02  Admit date: 02/03/2020 Date of Consult: 02/07/2020  Primary Care Provider: Shirline Frees, MD [facility] Cardiologist: [name] Casino, MD  EP: Dr. Rayann Rose  Patient Profile:   [name] Rose is a 69 y.o. male with a hx of persistent atrial fibrillation, hypertension hyperlipidemia, mitral regurgitations, PVC, metastatic bladder cancer to intra-abdominal lymph node on chemotherapy, chronic kidney disease IV and chronic anemia who is being seen today for the evaluation of atrial fibrillation with RVR at the request of Dr. Tawanna Rose.   Long stand hx of afib with prior cardioversion. Intermediate risk stress test 06/2019 and Dr. Debara Rose suggested to use Flecainide but noted interaction with 4 of his medications and eventually he was started on Tikosyn for his afib 09/2019. Noted bigeminy PVCS (was present prior to Tikosyn per not). Follow up echo showed LVEF 55-60%, no WMA, mildly dilated LV, RV normal.  Last seen in Afib clinic 01/28/20. Overall, doing well. He was in sinus rhythm.   History of Present Illness:   [name] Rose presented 02/03/20 with generalized weakness and diarrhea with low appetite.   Admitted for sepsis secondary to pancytopenia (felt secondary to chemotherapy) and bacteremia growing Klebsiella pneumonia. He received fluids for hypotension.  Eliquis held due to worsening thrombocytopenia.  His metoprolol and Cardizem held initially. Patient [name] Rose has been hold since admit, only received one dose of 250 mcq on 10/24. Toprol XL $RemoveB'50mg'JpSssgai$  qd started 10/24>> uptitrated to $RemoveBefor'50mg'BdppbvzhObRX$  BID on 10/26. Noted in afib RVR yesterday AM an Started Cardizem CD $RemoveBef'360mg'nJYHPAMqRt$  qd.   Seen by nephrology yesterday for worsening renal failure to 4.1 from 2.5 on admit. Baseline Scr 1.9-2.6. Suspected  AKI due to sepsis/ hypoperfusion and hypovolemia. Fluid bolus given. However Scr worsen to 4.75 today.   Hgb improved after  2 units of PRBC.    Past Medical History:  Diagnosis Date  . Anemia   . Arthritis   . 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 ([place]) 09/29/2016  . Bladder tumor   . Chronic kidney disease    ckd stage 3, sees dr every 6 months, arf hemodialysis done x 2 2020  . Dyspnea    with exertion  . Dysrhythmia    atrial fib  . Essential hypertension 06/23/2018  . Goals of care, counseling/discussion 09/30/2016  . History of prostate cancer followed by pcp dr [name] Rose-  per pt last PSA undetectable   dx 2008-- (Stage T1c, Gleason 3+3,  PSA 4.58, vol 99cc)  s/p  radical prostatectomy (nerve sparing bilateral)   . Hyperglycemia 06/23/2018  . Hypertension   . Mild hyperlipidemia 06/23/2018  . Pre-diabetes   . Wears glasses     Past Surgical History:  Procedure Laterality Date  . CARDIOVERSION N/A 05/18/2019   Procedure: CARDIOVERSION;  Surgeon: [name] Perla, MD;  Location: [facility] ENDOSCOPY;  Service: Cardiovascular;  Laterality: N/A;  . CARDIOVERSION N/A 09/27/2019   Procedure: CARDIOVERSION;  Surgeon: [name] Heinz, MD;  Location: [place];  Service: Cardiovascular;  Laterality: N/A;  . CATARACT EXTRACTION W/ INTRAOCULAR LENS  IMPLANT, BILATERAL Bilateral 2011  . [place]  . IR FLUORO GUIDE PORT INSERTION RIGHT  10/07/2016  . IR RADIOLOGIST EVAL & MGMT  01/05/2018  . IR US GUIDE VASC ACCESS RIGHT  10/07/2016  . KNEE ARTHROSCOPY Bilateral right 2006;  left 02-15-2007  . [place];  1990;  1983  . ROBOT ASSISTED LAPAROSCOPIC RADICAL  PROSTATECTOMY  06/20/2006   bilateral nerve sparing  . TEE WITHOUT CARDIOVERSION N/A 06/27/2018   Procedure: TRANSESOPHAGEAL ECHOCARDIOGRAM (TEE);  Surgeon: [name] Mormon, MD;  Location: [facility] ENDOSCOPY;  Service: Cardiovascular;  Laterality: N/A;  . TOTAL HIP ARTHROPLASTY Left 03/03/2015   Procedure: LEFT TOTAL HIP ARTHROPLASTY ANTERIOR APPROACH;  Surgeon: [name] Leitz, MD;  Location: [place];   Service: Orthopedics;  Laterality: Left;  . TOTAL KNEE ARTHROPLASTY Bilateral left 08-13-2009;  right 12-26-2009  . TRANSURETHRAL RESECTION OF BLADDER TUMOR N/A 09/20/2016   Procedure: TRANSURETHRAL RESECTION OF BLADDER TUMOR (TURBT);  Surgeon: [name] Gallo, MD;  Location: [facility];  Service: Urology;  Laterality: N/A;  . TRANSURETHRAL RESECTION OF BLADDER TUMOR WITH MITOMYCIN-C N/A 08/06/2019   Procedure: TRANSURETHRAL RESECTION OF BLADDER TUMOR;  Surgeon: [name] Gallo, MD;  Location: [facility];  Service: Urology;  Laterality: N/A;    Inpatient Medications: Scheduled Meds: . sodium chloride   Intravenous Once  . atorvastatin  40 mg Oral QPM  . Chlorhexidine Gluconate Cloth  6 each Topical Daily  . diltiazem  360 mg Oral Daily  . iohexol  500 mL Oral Q1H  . iohexol      . metoprolol succinate  50 mg Oral BID  . oxybutynin  10 mg Oral QHS  . pyridOXINE  200 mg Oral Q breakfast  . saccharomyces boulardii  250 mg Oral BID  . Tbo-Filgrastim  480 mcg Subcutaneous q1800   Continuous Infusions: . sodium chloride 125 mL/hr at 02/07/20 0228  .  ceFAZolin (ANCEF) IV 2 g (02/07/20 0939)   PRN Meds: acetaminophen **OR** [DISCONTINUED] acetaminophen, chlorproMAZINE, diphenoxylate-atropine, melatonin, ondansetron **OR** ondansetron (ZOFRAN) IV, traMADol  Allergies:   No Known Allergies  Social History:   Social History   Socioeconomic History  . Marital status: Married    Spouse name: Not on file  . Number of children: Not on file  . Years of education: Not on file  . Highest education level: Not on file  Occupational History  . Not on file  Tobacco Use  . Smoking status: Former Smoker    Packs/day: 1.00    Years: 16.00    Pack years: 16.00    Types: Cigarettes    Quit date: 09/25/1984    Years since quitting: 35.3  . Smokeless tobacco: Never Used  Vaping Use  . Vaping Use: Never used  Substance and Sexual Activity  . Alcohol use:  Yes    Comment: occasionally  . Drug use: Yes    Types: Marijuana    Comment: after chemo to help sleep  . Sexual activity: Not on file  Other Topics Concern  . Not on file  Social History Narrative  . Not on file   Social Determinants of Health   Financial Resource Strain:   . Difficulty of Paying Living Expenses: Not on file  Food Insecurity:   . Worried About Charity fundraiser in the Last Year: Not on file  . Ran Out of Food in the Last Year: Not on file  Transportation Needs:   . Lack of Transportation (Medical): Not on file  . Lack of Transportation (Non-Medical): Not on file  Physical Activity:   . Days of Exercise per Week: Not on file  . Minutes of Exercise per Session: Not on file  Stress:   . Feeling of Stress : Not on file  Social Connections:   . Frequency of Communication with Friends and Family: Not on file  . Frequency  of Social Gatherings with Friends and Family: Not on file  . Attends Religious Services: Not on file  . Active Member of Clubs or Organizations: Not on file  . Attends Banker Meetings: Not on file  . Marital Status: Not on file  Intimate Partner Violence:   . Fear of Current or Ex-Partner: Not on file  . Emotionally Abused: Not on file  . Physically Abused: Not on file  . Sexually Abused: Not on file    Family History:   Family History  Problem Relation Age of Onset  . Hypertension Mother   . Aneurysm Mother   . Emphysema Father   . Hypertension Father      ROS:  Please see the history of present illness.  All other ROS reviewed and negative.     Physical Exam/Data:   Vitals:   02/06/20 1400 02/06/20 1442 02/06/20 2136 02/07/20 0513  BP: 125/79  121/78 118/70  Pulse: (!) 107 (!) 113 99 (!) 110  Resp:   20 20  Temp: 98 F (36.7 C)  97.7 F (36.5 C) 98.4 F (36.9 C)  TempSrc: Oral  Oral Oral  SpO2: 100% 100% 99% 96%  Weight:      Height:        Intake/Output Summary (Last 24 hours) at 02/07/2020  1026 Last data filed at 02/07/2020 0600 Gross per 24 hour  Intake 4356.35 ml  Output 300 ml  Net 4056.35 ml   Last 3 Weights 02/03/2020 02/03/2020 01/28/2020  Weight (lbs) 267 lb 12.8 oz 270 lb 270 lb 9.6 oz  Weight (kg) 121.473 kg 122.471 kg 122.743 kg     Body mass index is 34.38 kg/m.  General:  Well nourished, well developed, in no acute distress HEENT: normal Lymph: no adenopathy Neck: JVD Endocrine:  No thryomegaly Vascular: No carotid bruits; FA pulses 2+ bilaterally without bruits  Cardiac:  normal S1, S2; irregularly irregular; no murmur  Lungs: Faint Rales Abd: soft, nontender, no hepatomegaly  Ext: Trace edema  Musculoskeletal:  No deformities, BUE and BLE strength normal and equal Skin: warm and dry  Neuro:  CNs 2-12 intact, no focal abnormalities noted Psych:  Normal affect   EKG:  The EKG was personally reviewed and demonstrates:  On EKG on admit, will get today  Telemetry:  Telemetry was personally reviewed and demonstrates: Atrial fibrillation at rate of 90 to 100 bpm  Relevant CV Studies:  Echo 10/05/2019 1. Normal LV function; mild LVE; trace MR and TR.  2. Left ventricular ejection fraction, by estimation, is 55 to 60%. The  left ventricle has normal function. The left ventricle has no regional  wall motion abnormalities. The left ventricular internal cavity size was  mildly dilated. Left ventricular  diastolic parameters were normal.  3. Right ventricular systolic function is normal. The right ventricular  size is normal. There is mildly elevated pulmonary artery systolic  pressure.  4. The mitral valve is normal in structure. Trivial mitral valve  regurgitation. No evidence of mitral stenosis.  5. The aortic valve has an indeterminant number of cusps. Aortic valve  regurgitation is not visualized. No aortic stenosis is present.  6. The inferior vena cava is normal in size with greater than 50%  respiratory variability, suggesting right atrial  pressure of 3 mmHg.   Stress test 06/2019  There was no ST segment deviation noted during stress.  Findings consistent with prior myocardial infarction with peri-infarct ischemia.  This is an intermediate risk study.  There is a medium size perfusion defect. There is a mild fixed perfusion defect in the mid anterior wall. There is a moderate perfusoin defect in the anteroapex, and mild defect in the apical cap, both with partial reversibility. These findings suggest prior infarct, with peri-infarct ischemia.   Volumetric assessment, EF and wall motion could not be assessed in the absence of ECG gating with atrial fibrillation.     Laboratory Data:  High Sensitivity Troponin:  No results for input(s): TROPONINIHS in the last 720 hours.   Chemistry Recent Labs  Lab 02-11-20 0500 02/06/20 0515 02/07/20 0342  NA 131* 130* 132*  K 3.1* 3.2* 4.0  CL 101 100 102  CO2 19* 18* 17*  GLUCOSE 174* 155* 175*  BUN 55* 58* 69*  CREATININE 3.55* 4.15* 4.75*  CALCIUM 7.8* 8.0* 8.2*  GFRNONAA 18* 15* 13*  ANIONGAP $RemoveB'11 12 13    'IMkrDPpH$ Recent Labs  Lab 02/03/20 1128  PROT 5.4*  ALBUMIN 3.0*  AST 25  ALT 33  ALKPHOS 69  BILITOT 1.2   Hematology Recent Labs  Lab 2020/02/11 0500 02/06/20 0515 02/07/20 0342  WBC 0.2* 0.3* 0.7*  RBC 2.90* 2.97* 3.08*  HGB 9.6* 10.0* 10.3*  HCT 27.2* 28.3* 30.2*  MCV 93.8 95.3 98.1  MCH 33.1 33.7 33.4  MCHC 35.3 35.3 34.1  RDW 14.8 14.9 14.6  PLT 15* 24* 23*   BNPNo results for input(s): BNP, PROBNP in the last 168 hours.  DDimer No results for input(s): DDIMER in the last 168 hours.   Radiology/Studies:  US RENAL  Result Date: 02-11-20 CLINICAL DATA:  Acute kidney injury EXAM: RENAL / URINARY TRACT ULTRASOUND COMPLETE COMPARISON:  09/21/2019 FINDINGS: Right Kidney: Renal measurements: 11.3 x 5.5 x 5.7 cm = volume: 185 mL. Echogenicity within normal limits. No mass or hydronephrosis visualized. Left Kidney: Renal measurements: 13.1 x 6 x 5.2 cm =  volume: 212 mL. Echogenicity within normal limits. No mass or hydronephrosis visualized. Bladder: Appears normal for degree of bladder distention. Other: Incidental note made of echogenic hepatic parenchyma. IMPRESSION: 1. Normal ultrasound appearance of the kidneys 2. Liver appears slightly echogenic suggesting steatosis Electronically Signed   By: Donavan Foil M.D.   On: 02/11/20 18:46   DG Abd Portable 1V  Result Date: 02/07/2020 CLINICAL DATA:  Abdominal distension. EXAM: PORTABLE ABDOMEN - 1 VIEW COMPARISON:  12/20/2019 nuclear medicine PET. FINDINGS: Dilated small and large bowel loops. No pneumoperitoneum. No air-fluid levels. No suspicious calcific densities. Multilevel spondylosis. Sequela of left hip arthroplasty. IMPRESSION: Dilated small and large bowel loops, concerning for ileus versus obstruction. These results will be called to the ordering clinician or representative by the Radiologist Assistant, and communication documented in the PACS or [employer]. Electronically Signed   By: Primitivo Gauze M.D.   On: 02/07/2020 08:10     Assessment and Plan:   1. Persistent atrial fibrillation - Patient was in sinus rhythm when seen in atrial fibrillation clinic 10/18.  -Unfortunately no EKG on admit and his Tikosyn was held.  Likely due to renal function. His rate control agent also held on admit secondary to hypovolemia and hypotension. - Now he is back on his rate control agent Cardizem and beta-blocker with stable heart rate in 90s-110s.   CHA2DS2-VASc Score = 2  This indicates a 2.2% annual risk of stroke. The patient's score is based upon: CHF History: 0 HTN History: 1 Diabetes History: 0 Stroke History: 0 Vascular Disease History: 0 Age Score: 1 Gender Score: 0   -  His anticoagulation has been held secondary to thrombocytopenia plan to restart when platelets above 30,000. -Reviewed with pharmacist, only medication that can interact with Tikosyn are needed Zofran and  chlorpromazine. -Get baseline EKG.  Will review with MD if safe to restart but renal function continue to worsen. .  Potassium 4.0 today.  Magnesium was 1.7 yesterday.  Will recheck today. Keep K > 4 and Mg > 2.0 - ? amiodarone  2.  Thrombocytopenia -S/p transfusion with platelets -Plan to restart Eliquis when platelets are greater than 3000  3. Acute on CKD IV - renal function worsen to 4.75 today despite hydration (Baseline Scr 1.9-2.6) - followed by nephrology - Mild LE edema with ? rales   4. Metastatic bladder cancer - Per oncology  Dr. Radford Pax to see.  For questions or updates, please contact [place] Please consult [email or web address] for contact info under    Jarrett Soho, PA  02/07/2020 10:26 AM

## 2020-02-07 NOTE — Progress Notes (Addendum)
[name] is now down on 18 W.  I think he was brought down because of his renal function.  His renal function seems to be deteriorating.  I know that he was seen by Nephrology.  He has abdominal distention.  When I listen to his abdomen this morning, I cannot hear bowel sounds.  I do not know if he may be developing an ileus.  He has had no vomiting although he is nauseated.  He is being treated for the Klebsiella bacteremia.  He is on Rocephin for this.  His white cell count is starting to come up nicely.  His white cell count today is 700.  Platelet count is 23,000.  Hemoglobin is 10.3.  His BUN is 69 creatinine 4.75.  His potassium is 4.  Calcium is 8.2.  He has had no obvious fever.  There has been no bleeding.  He has had no cough.  His vital signs show temperature 98.4.  Pulse 110.  Blood pressure is 118/70.  His lungs sound pretty clear bilaterally.  He has good air movement bilaterally.  Cardiac exam regular rate and irregular rhythm consistent with atrial fibrillation.  Abdomen is slightly distended.  Bowel sounds are not present.  There is no guarding or rebound tenderness.  Extremities show some chronic mild edema.  Neurological exam is nonfocal.  Oral exam shows no mucositis.  Hopefully, his renal function will improve.  I agree with nephrology that this probably is reflective of low blood pressure, hypovolemia and the Klebsiella bacteremia.  He is still making urine.  There is no evidence of volume overload that would necessitate dialysis.  I am glad that his white cell count is trending upward.  Hopefully, he will be above 1000 tomorrow.  I appreciate the great care that he will get from the staff on [place], MD  Proverbs 4:7  ADDENDUM: Unfortunately, the abdominal x-ray does show an ileus.  I doubt this is an obstruction so's I cannot hear any bowel sounds.  We will have to adjust his diet.  We will have to try him on some Reglan to see this may not help with  intestinal motility.  [name], MD

## 2020-02-07 NOTE — Progress Notes (Signed)
PT Cancellation Note  Patient Details Name: [name] Rose MRN: [id number] DOB: 1950-08-06   Cancelled Treatment:    Reason Eval/Treat Not Completed: Medical issues which prohibited therapy (pt c/o nausea and experienced emesis earlier. RN reports plans for NGT today, will follow up at later date/time as schedule allows and pt able.)   Gwynneth Albright PT, DPT Acute Rehabilitation Services  Office [phone] Pager [phone]  02/07/2020 2:37 PM

## 2020-02-07 NOTE — Progress Notes (Signed)
[name] was informed of imaging results as asked by radiology when they called to relay the results.

## 2020-02-08 ENCOUNTER — Encounter (HOSPITAL_COMMUNITY): Payer: Self-pay | Admitting: Internal Medicine

## 2020-02-08 ENCOUNTER — Inpatient Hospital Stay (HOSPITAL_COMMUNITY): Payer: PPO

## 2020-02-08 DIAGNOSIS — I4819 Other persistent atrial fibrillation: Secondary | ICD-10-CM | POA: Diagnosis not present

## 2020-02-08 DIAGNOSIS — N179 Acute kidney failure, unspecified: Secondary | ICD-10-CM | POA: Diagnosis not present

## 2020-02-08 DIAGNOSIS — I4891 Unspecified atrial fibrillation: Secondary | ICD-10-CM

## 2020-02-08 DIAGNOSIS — R627 Adult failure to thrive: Secondary | ICD-10-CM | POA: Diagnosis not present

## 2020-02-08 DIAGNOSIS — D696 Thrombocytopenia, unspecified: Secondary | ICD-10-CM | POA: Diagnosis not present

## 2020-02-08 LAB — CBC WITH DIFFERENTIAL/PLATELET
Abs Immature Granulocytes: 0.01 10*3/uL (ref 0.00–0.07)
Basophils Absolute: 0 10*3/uL (ref 0.0–0.1)
Basophils Relative: 1 %
Eosinophils Absolute: 0.4 10*3/uL (ref 0.0–0.5)
Eosinophils Relative: 16 %
HCT: 29.5 % — ABNORMAL LOW (ref 39.0–52.0)
Hemoglobin: 10 g/dL — ABNORMAL LOW (ref 13.0–17.0)
Immature Granulocytes: 0 %
Lymphocytes Relative: 20 %
Lymphs Abs: 0.5 10*3/uL — ABNORMAL LOW (ref 0.7–4.0)
MCH: 33.3 pg (ref 26.0–34.0)
MCHC: 33.9 g/dL (ref 30.0–36.0)
MCV: 98.3 fL (ref 80.0–100.0)
Monocytes Absolute: 0.4 10*3/uL (ref 0.1–1.0)
Monocytes Relative: 19 %
Neutro Abs: 1 10*3/uL — ABNORMAL LOW (ref 1.7–7.7)
Neutrophils Relative %: 44 %
Platelets: 22 10*3/uL — CL (ref 150–400)
RBC: 3 MIL/uL — ABNORMAL LOW (ref 4.22–5.81)
RDW: 14.7 % (ref 11.5–15.5)
WBC: 2.4 10*3/uL — ABNORMAL LOW (ref 4.0–10.5)
nRBC: 0 % (ref 0.0–0.2)

## 2020-02-08 LAB — COMPREHENSIVE METABOLIC PANEL
ALT: 9 U/L (ref 0–44)
AST: 14 U/L — ABNORMAL LOW (ref 15–41)
Albumin: 2.3 g/dL — ABNORMAL LOW (ref 3.5–5.0)
Alkaline Phosphatase: 44 U/L (ref 38–126)
Anion gap: 14 (ref 5–15)
BUN: 83 mg/dL — ABNORMAL HIGH (ref 8–23)
CO2: 16 mmol/L — ABNORMAL LOW (ref 22–32)
Calcium: 8 mg/dL — ABNORMAL LOW (ref 8.9–10.3)
Chloride: 104 mmol/L (ref 98–111)
Creatinine, Ser: 5.82 mg/dL — ABNORMAL HIGH (ref 0.61–1.24)
GFR, Estimated: 10 mL/min — ABNORMAL LOW (ref >60)
Glucose, Bld: 125 mg/dL — ABNORMAL HIGH (ref 70–99)
Potassium: 3 mmol/L — ABNORMAL LOW (ref 3.5–5.1)
Sodium: 134 mmol/L — ABNORMAL LOW (ref 135–145)
Total Bilirubin: 0.7 mg/dL (ref 0.3–1.2)
Total Protein: 4.9 g/dL — ABNORMAL LOW (ref 6.5–8.1)

## 2020-02-08 LAB — GLUCOSE, CAPILLARY
Glucose-Capillary: 117 mg/dL — ABNORMAL HIGH (ref 70–99)
Glucose-Capillary: 116 mg/dL — ABNORMAL HIGH (ref 70–99)
Glucose-Capillary: 135 mg/dL — ABNORMAL HIGH (ref 70–99)
Glucose-Capillary: 122 mg/dL — ABNORMAL HIGH (ref 70–99)

## 2020-02-08 MED ORDER — SODIUM CHLORIDE 0.9% FLUSH
10.0000 mL | INTRAVENOUS | Status: DC | PRN
  Administered 2020-02-22: 10 mL

## 2020-02-08 MED ORDER — METOPROLOL SUCCINATE ER 50 MG PO TB24: 50.0000 mg | ORAL_TABLET | Freq: Every evening | ORAL | Status: DC

## 2020-02-08 MED ORDER — METOPROLOL TARTRATE 5 MG/5ML IV SOLN
5.0000 mg | Freq: Four times a day (QID) | INTRAVENOUS | Status: DC
  Administered 2020-02-08 – 2020-02-12 (×18): 5 mg via INTRAVENOUS
  Filled 2020-02-08 (×18): qty 5

## 2020-02-08 MED ORDER — METOPROLOL SUCCINATE ER 25 MG PO TB24: 25.0000 mg | ORAL_TABLET | Freq: Once | ORAL | Status: DC

## 2020-02-08 MED ORDER — SODIUM CHLORIDE 0.9% FLUSH
10.0000 mL | Freq: Two times a day (BID) | INTRAVENOUS | Status: DC
  Administered 2020-02-11 – 2020-02-18 (×10): 10 mL
  Administered 2020-02-18: 20 mL
  Administered 2020-02-18 – 2020-02-25 (×15): 10 mL

## 2020-02-08 MED ORDER — SODIUM BICARBONATE 8.4 % IV SOLN
INTRAVENOUS | Status: DC
  Filled 2020-02-08 (×3): qty 150

## 2020-02-08 MED ORDER — METOPROLOL SUCCINATE ER 100 MG PO TB24: 100.0000 mg | ORAL_TABLET | Freq: Every morning | ORAL | Status: DC

## 2020-02-08 NOTE — Plan of Care (Signed)

## 2020-02-08 NOTE — Progress Notes (Signed)
OT Cancellation Note  Patient Details Name: [name] Rose MRN: [id number] DOB: May 12, 1950   Cancelled Treatment:    Reason Eval/Treat Not Completed: Fatigue/lethargy limiting ability to participate. Patient reports fatigue after sitting in chair and ambulating with PT today.   Latrisa Hellums L Kenika Sahm 02/08/2020, 2:43 PM

## 2020-02-08 NOTE — Progress Notes (Signed)
Physical Therapy Treatment Patient Details Name: [name] Rose MRN: [id number] DOB: [birth year] Today's Date: 02/08/2020    History of Present Illness 69 yo male admitted with FTT, pancytopenia, thrombocytopenia, sepsis, weakness. hx of met prostate ca with bladder mets, Afib, CKD    PT Comments    Pt in bed with NG tube and RN at bedside. Assisted OOB to amb.  General Gait Details: tolerated an increased distance however HR increased to 140's .  Recliner following behind for safety.  Still limited activity tolerance and MAX c/o faigue.   Pt amb twice with one seated rest break.  Pt feeling "better" but still "not great".    Follow Up Recommendations  Home health PT;Supervision/Assistance - 24 hour     Equipment Recommendations  None recommended by PT    Recommendations for Other Services       Precautions / Restrictions Precautions Precautions: Fall Precaution Comments: NPO Restrictions Weight Bearing Restrictions: No    Mobility  Bed Mobility Overal bed mobility: Needs Assistance Bed Mobility: Supine to Sit     Supine to sit: Max assist     General bed mobility comments: Increased time and effort for pt. reliant on bed rail.  Transfers Overall transfer level: Needs assistance Equipment used: Rolling walker (2 wheeled) Transfers: Sit to/from Stand Sit to Stand: Min guard         General transfer comment: from elevated bed with increased effort.  Ambulation/Gait Ambulation/Gait assistance: Min assist Gait Distance (Feet): 60 Feet (30 feet x 2) Assistive device: Rolling walker (2 wheeled) Gait Pattern/deviations: Step-through pattern;Decreased stride length Gait velocity: decreased   General Gait Details: tolerated an increased distance however HR increased to 140's .  Recliner following behind for safety.  Still limited activity tolerance and MAX c/o faigue.   Stairs             Wheelchair Mobility    Modified Rankin (Stroke Patients Only)        Balance                                            Cognition Arousal/Alertness: Awake/alert Behavior During Therapy: WFL for tasks assessed/performed Overall Cognitive Status: Within Functional Limits for tasks assessed                                 General Comments: AxO x 3 very pleasant      Exercises      General Comments        Pertinent Vitals/Pain Pain Assessment: Faces Faces Pain Scale: Hurts little more Pain Location: ABD Pain Descriptors / Indicators: Tightness    Home Living                      Prior Function            PT Goals (current goals can now be found in the care plan section) Progress towards PT goals: Progressing toward goals    Frequency    Min 3X/week      PT Plan Current plan remains appropriate    Co-evaluation              AM-PAC PT "6 Clicks" Mobility   Outcome Measure  Help needed turning from your back to your side while in a flat bed without  using bedrails?: A Little Help needed moving from lying on your back to sitting on the side of a flat bed without using bedrails?: A Little Help needed moving to and from a bed to a chair (including a wheelchair)?: A Little Help needed standing up from a chair using your arms (e.g., wheelchair or bedside chair)?: A Little Help needed to walk in hospital room?: A Little Help needed climbing 3-5 steps with a railing? : A Lot 6 Click Score: 17    End of Session Equipment Utilized During Treatment: Gait belt Activity Tolerance: Patient tolerated treatment well Patient left: in chair;with call bell/phone within reach;with family/visitor present Nurse Communication: Mobility status PT Visit Diagnosis: Muscle weakness (generalized) (M62.81);History of falling (Z91.81);Repeated falls (R29.6)     Time: 1130-1155 PT Time Calculation (min) (ACUTE ONLY): 25 min  Charges:  $Gait Training: 8-22 mins $Therapeutic Activity: 8-22 mins                      Rica Koyanagi  PTA Acute  Rehabilitation Services Pager      [phone] Office      [phone]

## 2020-02-08 NOTE — Progress Notes (Addendum)
Progress Note  Patient Name: [name] Rose Date of Encounter: 02/08/2020  [facility] Cardiologist: Pixie Casino, MD   Subjective   Denies any chest pain or SOB.  Remains in atrial fibrillation with aberrant conduction at times and poor HR control in the 120's.  Unfortunately now NPO with NGT in due to ileus vs. SBO  Inpatient Medications    Scheduled Meds: . sodium chloride   Intravenous Once  . atorvastatin  40 mg Oral QPM  . Chlorhexidine Gluconate Cloth  6 each Topical Daily  . diltiazem  360 mg Oral Daily  . metoprolol succinate  25 mg Oral QPM  . metoprolol succinate  25 mg Oral Once  . metoprolol succinate  75 mg Oral q AM  . oxybutynin  10 mg Oral QHS  . pyridOXINE  200 mg Oral Q breakfast  . saccharomyces boulardii  250 mg Oral BID   Continuous Infusions: . sodium chloride 75 mL/hr at 02/07/20 2133  .  ceFAZolin (ANCEF) IV 2 g (02/07/20 2134)   PRN Meds: acetaminophen **OR** [DISCONTINUED] acetaminophen, chlorproMAZINE, melatonin, ondansetron **OR** ondansetron (ZOFRAN) IV, traMADol   Vital Signs    Vitals:   02/07/20 0513 02/07/20 1220 02/07/20 2115 02/08/20 0539  BP: 118/70 121/78 112/64 138/79  Pulse: (!) 110 95 93 98  Resp: 20 (!) $Remo'22 20 20  'HrPhf$ Temp: 98.4 F (36.9 C) 97.8 F (36.6 C) (!) 96.6 F (35.9 C) 97.8 F (36.6 C)  TempSrc: Oral   Oral  SpO2: 96% 98% 100% 96%  Weight:      Height:        Intake/Output Summary (Last 24 hours) at 02/08/2020 0942 Last data filed at 02/08/2020 0600 Gross per 24 hour  Intake --  Output 3825 ml  Net -3825 ml   Last 3 Weights 02/03/2020 02/03/2020 01/28/2020  Weight (lbs) 267 lb 12.8 oz 270 lb 270 lb 9.6 oz  Weight (kg) 121.473 kg 122.471 kg 122.743 kg      Telemetry    Atrial fibrillation with RVR - Personally Reviewed  ECG    No new EKG to review - Personally Reviewed  Physical Exam   GEN: No acute distress.   Neck: No JVD Cardiac: irregularly irregular, no murmurs, rubs, or gallops.   Respiratory: Clear to auscultation bilaterally. GI: Soft, nontender, non-distended  MS: No edema; No deformity. Neuro:  Nonfocal  Psych: Normal affect   Labs    High Sensitivity Troponin:  No results for input(s): TROPONINIHS in the last 720 hours.    Chemistry Recent Labs  Lab 02/03/20 1128 02/04/20 0539 02/06/20 0515 02/07/20 0342 02/08/20 0411  NA 130*   < > 130* 132* 134*  K 4.0   < > 3.2* 4.0 3.0*  CL 99   < > 100 102 104  CO2 21*   < > 18* 17* 16*  GLUCOSE 202*   < > 155* 175* 125*  BUN 43*   < > 58* 69* 83*  CREATININE 2.55*   < > 4.15* 4.75* 5.82*  CALCIUM 8.0*   < > 8.0* 8.2* 8.0*  PROT 5.4*  --   --   --  4.9*  ALBUMIN 3.0*  --   --   --  2.3*  AST 25  --   --   --  14*  ALT 33  --   --   --  9  ALKPHOS 69  --   --   --  44  BILITOT 1.2  --   --   --  0.7  GFRNONAA 27*   < > 15* 13* 10*  ANIONGAP 10   < > $R'12 13 14   'xz$ < > = values in this interval not displayed.     Hematology Recent Labs  Lab 02/06/20 0515 02/07/20 0342 02/08/20 0411  WBC 0.3* 0.7* 2.4*  RBC 2.97* 3.08* 3.00*  HGB 10.0* 10.3* 10.0*  HCT 28.3* 30.2* 29.5*  MCV 95.3 98.1 98.3  MCH 33.7 33.4 33.3  MCHC 35.3 34.1 33.9  RDW 14.9 14.6 14.7  PLT 24* 23* 22*    BNPNo results for input(s): BNP, PROBNP in the last 168 hours.   DDimer No results for input(s): DDIMER in the last 168 hours.   Radiology    CT ABDOMEN PELVIS WO CONTRAST  Result Date: 02/07/2020 CLINICAL DATA:  Generalized weakness, diarrhea, decreased appetite, hypotensive, leukopenia, thrombocytopenia, history metastatic bladder cancer, paroxysmal atrial fibrillation, hypertension, stage IV chronic kidney disease, question bowel obstruction EXAM: CT ABDOMEN AND PELVIS WITHOUT CONTRAST TECHNIQUE: Multidetector CT imaging of the abdomen and pelvis was performed following the standard protocol without IV contrast. Sagittal and coronal MPR images reconstructed from axial data set. Water as oral contrast. COMPARISON:   01/05/2018 FINDINGS: Lower chest: RIGHT lower lobe bronchiectasis. Bibasilar atelectasis. Small LEFT pleural effusion. Hepatobiliary: Low-attenuation foci within liver question cysts unchanged, largest 15 x 14 mm lateral segment LEFT lobe image 19. Gallbladder unremarkable. Pancreas: Atrophic pancreas without mass Spleen: Normal appearance Adrenals/Urinary Tract: Adrenal glands, kidneys, and ureters normal appearance. Bladder wall appears thickened but bladder is underdistended, question artifact. Stomach/Bowel: Minimal sigmoid diverticulosis without evidence of diverticulitis. Diffuse dilatation of small bowel loops throughout abdomen. Stomach distended by fluid. Fluid within distended thus distal esophagus question reflux. Small bowel loops are but question of bowel wall thickening of the distal ileum is raised. Mild gaseous distension of ascending and transverse colon. Descending and sigmoid colon decompressed without discrete transition zone. Vascular/Lymphatic: Atherosclerotic calcifications aorta and iliac arteries without aneurysm. Coronary arterial calcifications. No adenopathy. Reproductive: Prior prostatectomy. Other: No free air. Small amount of fluid at RIGHT gutter. Minimal stranding of mesentery. Musculoskeletal: Degenerative disc disease changes thoracolumbar spine. IMPRESSION: Diffuse dilatation of small bowel loops throughout abdomen to distal ileum question obstruction. Suspected bowel wall thickening of distal ileum, uncertain etiology but could represent inflammatory bowel disease or infection, tumor considered less likely due to length of involvement; suspect this is responsible for the observed component of small-bowel obstruction. Small amount of nonspecific free fluid. Minimal sigmoid diverticulosis without evidence of diverticulitis. RIGHT lower lobe bronchiectasis with bibasilar atelectasis and small LEFT pleural effusion. Aortic Atherosclerosis (ICD10-I70.0). Electronically Signed   By:  Lavonia Dana M.D.   On: 02/07/2020 13:59   DG Abd 1 View  Result Date: 02/08/2020 CLINICAL DATA:  Follow-up ileus. EXAM: ABDOMEN - 1 VIEW COMPARISON:  Abdomen 02/07/2020.  CT 02/07/2020. FINDINGS: NG tube noted with tip over stomach. Persistent prominently dilated loops of small bowel noted. Colonic dilatation also noted. No free air identified. Degenerative change in scoliosis lumbar spine. Degenerative changes right hip. Total left hip replacement. IMPRESSION: 1.  NG tube noted with tip over stomach. 2. Persistent prominently dilated loops of small bowel again noted. Colonic dilatation also noted. Similar findings noted on prior exams. Findings consistent with persistent ileus. Continued follow-up exam suggested to demonstrate resolution and to exclude bowel obstruction. Electronically Signed   By: Marcello Moores  Register   On: 02/08/2020 07:35   DG Abd Portable 1V  Result Date: 02/07/2020 CLINICAL DATA:  69 year old male encounter  for NG placement. EXAM: PORTABLE ABDOMEN - 1 VIEW COMPARISON:  Earlier radiograph dated 02/07/2020. FINDINGS: Interval advancement of the enteric tube with side-port and tip in the proximal stomach. Partially visualized central venous line with tip over the cavoatrial junction. IMPRESSION: Enteric tube with tip in the proximal stomach. Electronically Signed   By: Anner Crete M.D.   On: 02/07/2020 17:43   DG Abd Portable 1V  Result Date: 02/07/2020 CLINICAL DATA:  NG tube placement. EXAM: PORTABLE ABDOMEN - 1 VIEW COMPARISON:  CT 02/07/2020. FINDINGS: PowerPort catheter noted tip over cavoatrial junction. NG tube noted with tip at the gastroesophageal junction. Consider advancement of approximately 15 cm. Dilated loops of small bowel again noted. No free air identified. IMPRESSION: 1. NG tube with tip at the gastroesophageal junction. Consider advancement of approximately 15 cm. 2. Dilated loops of small bowel again noted. Electronically Signed   By: Marcello Moores  Register   On:  02/07/2020 16:22   DG Abd Portable 1V  Result Date: 02/07/2020 CLINICAL DATA:  Abdominal distension. EXAM: PORTABLE ABDOMEN - 1 VIEW COMPARISON:  12/20/2019 nuclear medicine PET. FINDINGS: Dilated small and large bowel loops. No pneumoperitoneum. No air-fluid levels. No suspicious calcific densities. Multilevel spondylosis. Sequela of left hip arthroplasty. IMPRESSION: Dilated small and large bowel loops, concerning for ileus versus obstruction. These results will be called to the ordering clinician or representative by the Radiologist Assistant, and communication documented in the PACS or [employer]. Electronically Signed   By: Primitivo Gauze M.D.   On: 02/07/2020 08:10    Cardiac Studies   Echo 10/05/2019 1. Normal LV function; mild LVE; trace MR and TR.  2. Left ventricular ejection fraction, by estimation, is 55 to 60%. The  left ventricle has normal function. The left ventricle has no regional  wall motion abnormalities. The left ventricular internal cavity size was  mildly dilated. Left ventricular  diastolic parameters were normal.  3. Right ventricular systolic function is normal. The right ventricular  size is normal. There is mildly elevated pulmonary artery systolic  pressure.  4. The mitral valve is normal in structure. Trivial mitral valve  regurgitation. No evidence of mitral stenosis.  5. The aortic valve has an indeterminant number of cusps. Aortic valve  regurgitation is not visualized. No aortic stenosis is present.  6. The inferior vena cava is normal in size with greater than 50%  respiratory variability, suggesting right atrial pressure of 3 mmHg.   Stress test 06/2019  There was no ST segment deviation noted during stress.  Findings consistent with prior myocardial infarction with peri-infarct ischemia.  This is an intermediate risk study.  There is a medium size perfusion defect. There is a mild fixed perfusion defect in the mid anterior wall.  There is a moderate perfusoin defect in the anteroapex, and mild defect in the apical cap, both with partial reversibility. These findings suggest prior infarct, with peri-infarct ischemia.   Volumetric assessment, EF and wall motion could not be assessed in the absence of ECG gating with atrial fibrillation.    Patient Profile     69 y.o. male with a hx of persistent atrial fibrillation, hypertension hyperlipidemia, mitral regurgitations, PVC, metastatic bladder cancer to intra-abdominal lymph node on chemotherapy, chronic kidney disease IV and chronic anemia who is being seen today for the evaluation of atrial fibrillation with RVR at the request of Dr. Tawanna Solo.   Assessment & Plan    1. Persistent atrial fibrillation -Patient was in sinus rhythm when seen in atrial fibrillation  clinic 10/18.  -Unfortunately no EKG on admit and his Tikosyn was held due to worsening renal function.  -His rate control agent also held on admit secondary to hypovolemia and hypotension. -now NPO with NGT in due to ileus vs. SBO -HR not well controlled as he currently is not getting PO meds  CHA2DS2-VASc Score = 2  This indicates a 2.2% annual risk of stroke. The patient's score is based upon: CHF History: 0 HTN History: 1 Diabetes History: 0 Stroke History: 0 Vascular Disease History: 0 Age Score: 1 Gender Score: 0   -start Lopressor $RemoveBeforeDE'5mg'IkvCkZtjgkuZDMC$  IV q4 hours -may need to start IV Cardizem gtt if HR not controlled on BB -His anticoagulation has been held secondary to thrombocytopenia plan to restart when platelets above 30,000 (Plt ct 22K today). -Keep K > 4 and Mg > 2.0 per TRH -cannot restart Tikosyn since he has been off anticoagulation for more than 48 hours and therefore at risk for LA thrombus as well as contraindicated in AKI  2.  Thrombocytopenia -related to chemo -S/p transfusion with platelets -Plan to restart Eliquis when platelets are greater than 30K  3. Acute on CKD IV - renal function  continues to trend upward to 5.82 today (Baseline Scr 1.9-2.6) - followed by nephrology  4. Metastatic bladder cancer - Per oncology     For questions or updates, please contact [place] Please consult [email or web address] for contact info under        Signed, Fransico Him, MD  02/08/2020, 9:42 AM

## 2020-02-08 NOTE — Progress Notes (Signed)
The good news is that [name]'s white cell count is now 2.4.  We should be able to stop the Neupogen.  His platelet count is holding steady.  This should be coming up soon  The bad news is that he has an NG tube in out.  I still think he has an ileus.  The CT scan might show a small bowel obstruction.  I really cannot hear any bowel sounds when I listen to his abdomen.  He had quite a bit of fluid drained via the NG tube.  The worst news is that his renal function is continuing to deteriorate.  His BUN is a 3 creatinine 5.82.  Potassium 3.0.  His calcium is 8 with an albumin of 2.3.  I hate the fact that his renal function is not getting better.  It certainly looks like he might be headed toward dialysis.  The Klebsiella bacteremia should be treated.  The Klebsiella is sensitive to most antibiotics.  He actually feels a little bit better having the NG tube in place.  He is not having diarrhea.  His vital signs show temperature 97.8.  Pulse 98.  Blood pressure 138/79.  Oxygen saturation is 96%.  His lungs are clear.  Cardiac exam regular rate and irregular rhythm consistent with the atrial fibrillation.  Abdomen is soft.  It is not as distended.  I still cannot hear any bowel sounds.  Extremities show some mild chronic edema in his legs.  Skin exam shows no rashes.  Neurological exam shows no focal deficits.  Ms. Radziewicz has Klebsiella bacteremia.  He was neutropenic.  This has resolved, thankfully.  I think the real issue now is the renal function.  It sounds like he might be headed for dialysis.  He has had dialysis before that was temporary.  I would like to hope that the NG tube will be just briefly temporary.  Again I have to believe this is an ileus and not an obstruction.  I know that he is getting outstanding care from all the staff on [address].  I appreciate all of their wonderful care and compassion.  [name], MD  Psalm 73:26

## 2020-02-08 NOTE — Progress Notes (Signed)
[facility] Progress Note   69 y.o.year-old afib, HTN, CKD 4 baseline creat is 1.9- 2.6 from 2021and bladder cancer w/ mets on chemoRx presented on 10/24 w/ gen'd weakness, diarrhea for 5 days, no appetite or fevers.  BP soft 99/70, WBC 0.21, creat 2.55, plt 30. Seen by ONC, got Granix, getting plts today , sp 2u prbc. Pt blood cx's grew klebs pna and pt is on IVF's and IV abx. Creat on admit 2.5 >>up to 3.5 ->  4.1. Urine output trend going the wrong direction. Was on dialysis in June 2021 temporarily as well.   Assessment/ Plan:   1. AoCKD 4 - Baseline creat is 1.9- 2.6 from 2021, eGFR 26- 34 ml/min. Creat 2.5 on admit and has steadily increased despite aggressive isotonic resuscitation.  AKI likely due to sepsis/ hypoperfusion.  - Will change IVF to include HCO3 and increase rate to 125 ml/hr and hopefully he starts to turn around soon. Almost certainly in ATN and he has required dialysis in 09/2019 as well.   - He is feeling better but with renal function still trending wrong direction he may still require RRT temporarily.  - Plan is HCO3 based fluids and if renal function continues to worsen then transfer to [facility] for iHD on Monday.  Will eval daily for indications to initiate RRT earlier.  2. Klebs pna sepsis - BP's wnl now, on IV abx, not sure source, possibly just d/t neutropenia. 3. Neutropenia/ pancytopenia - per pmd, ONC 4. Atrial fib - home meds are on hold for now given hypotension 5. Metastatic bladder cancer - per pmd  Subjective:   Feeling much better after the NGT to wall suction placed Denies dyspnea/ CP/ F/C/N   Objective:   BP 138/79 (BP Location: Left Arm)    Pulse 98    Temp 97.8 F (36.6 C) (Oral)    Resp 20    Ht $R'6\' 2"'EL$  (1.88 m)    Wt 121.5 kg    SpO2 96%    BMI 34.38 kg/m   Intake/Output Summary (Last 24 hours) at 02/08/2020 1137 Last data filed at 02/08/2020 0600 Gross per 24 hour  Intake --  Output 3825 ml  Net -3825 ml   Weight change:    Physical Exam: Genalert, sitting on side of bed No jvd or bruits Chest clear bilatto bases  CV irreg irregno MRG Abd soft ntnd no mass or ascites, NGT Exttr edema, no wounds or ulcers  Neuro is alert, Ox 3 , nf, no asterixis  Imaging: CT ABDOMEN PELVIS WO CONTRAST  Result Date: 02/07/2020 CLINICAL DATA:  Generalized weakness, diarrhea, decreased appetite, hypotensive, leukopenia, thrombocytopenia, history metastatic bladder cancer, paroxysmal atrial fibrillation, hypertension, stage IV chronic kidney disease, question bowel obstruction EXAM: CT ABDOMEN AND PELVIS WITHOUT CONTRAST TECHNIQUE: Multidetector CT imaging of the abdomen and pelvis was performed following the standard protocol without IV contrast. Sagittal and coronal MPR images reconstructed from axial data set. Water as oral contrast. COMPARISON:  01/05/2018 FINDINGS: Lower chest: RIGHT lower lobe bronchiectasis. Bibasilar atelectasis. Small LEFT pleural effusion. Hepatobiliary: Low-attenuation foci within liver question cysts unchanged, largest 15 x 14 mm lateral segment LEFT lobe image 19. Gallbladder unremarkable. Pancreas: Atrophic pancreas without mass Spleen: Normal appearance Adrenals/Urinary Tract: Adrenal glands, kidneys, and ureters normal appearance. Bladder wall appears thickened but bladder is underdistended, question artifact. Stomach/Bowel: Minimal sigmoid diverticulosis without evidence of diverticulitis. Diffuse dilatation of small bowel loops throughout abdomen. Stomach distended by fluid. Fluid within distended thus distal esophagus question  reflux. Small bowel loops are but question of bowel wall thickening of the distal ileum is raised. Mild gaseous distension of ascending and transverse colon. Descending and sigmoid colon decompressed without discrete transition zone. Vascular/Lymphatic: Atherosclerotic calcifications aorta and iliac arteries without aneurysm. Coronary arterial calcifications. No adenopathy.  Reproductive: Prior prostatectomy. Other: No free air. Small amount of fluid at RIGHT gutter. Minimal stranding of mesentery. Musculoskeletal: Degenerative disc disease changes thoracolumbar spine. IMPRESSION: Diffuse dilatation of small bowel loops throughout abdomen to distal ileum question obstruction. Suspected bowel wall thickening of distal ileum, uncertain etiology but could represent inflammatory bowel disease or infection, tumor considered less likely due to length of involvement; suspect this is responsible for the observed component of small-bowel obstruction. Small amount of nonspecific free fluid. Minimal sigmoid diverticulosis without evidence of diverticulitis. RIGHT lower lobe bronchiectasis with bibasilar atelectasis and small LEFT pleural effusion. Aortic Atherosclerosis (ICD10-I70.0). Electronically Signed   By: Lavonia Dana M.D.   On: 02/07/2020 13:59   DG Abd 1 View  Result Date: 02/08/2020 CLINICAL DATA:  Follow-up ileus. EXAM: ABDOMEN - 1 VIEW COMPARISON:  Abdomen 02/07/2020.  CT 02/07/2020. FINDINGS: NG tube noted with tip over stomach. Persistent prominently dilated loops of small bowel noted. Colonic dilatation also noted. No free air identified. Degenerative change in scoliosis lumbar spine. Degenerative changes right hip. Total left hip replacement. IMPRESSION: 1.  NG tube noted with tip over stomach. 2. Persistent prominently dilated loops of small bowel again noted. Colonic dilatation also noted. Similar findings noted on prior exams. Findings consistent with persistent ileus. Continued follow-up exam suggested to demonstrate resolution and to exclude bowel obstruction. Electronically Signed   By: Marcello Moores  Register   On: 02/08/2020 07:35   DG Abd Portable 1V  Result Date: 02/07/2020 CLINICAL DATA:  69 year old male encounter for NG placement. EXAM: PORTABLE ABDOMEN - 1 VIEW COMPARISON:  Earlier radiograph dated 02/07/2020. FINDINGS: Interval advancement of the enteric tube with  side-port and tip in the proximal stomach. Partially visualized central venous line with tip over the cavoatrial junction. IMPRESSION: Enteric tube with tip in the proximal stomach. Electronically Signed   By: Anner Crete M.D.   On: 02/07/2020 17:43   DG Abd Portable 1V  Result Date: 02/07/2020 CLINICAL DATA:  NG tube placement. EXAM: PORTABLE ABDOMEN - 1 VIEW COMPARISON:  CT 02/07/2020. FINDINGS: PowerPort catheter noted tip over cavoatrial junction. NG tube noted with tip at the gastroesophageal junction. Consider advancement of approximately 15 cm. Dilated loops of small bowel again noted. No free air identified. IMPRESSION: 1. NG tube with tip at the gastroesophageal junction. Consider advancement of approximately 15 cm. 2. Dilated loops of small bowel again noted. Electronically Signed   By: Marcello Moores  Register   On: 02/07/2020 16:22   DG Abd Portable 1V  Result Date: 02/07/2020 CLINICAL DATA:  Abdominal distension. EXAM: PORTABLE ABDOMEN - 1 VIEW COMPARISON:  12/20/2019 nuclear medicine PET. FINDINGS: Dilated small and large bowel loops. No pneumoperitoneum. No air-fluid levels. No suspicious calcific densities. Multilevel spondylosis. Sequela of left hip arthroplasty. IMPRESSION: Dilated small and large bowel loops, concerning for ileus versus obstruction. These results will be called to the ordering clinician or representative by the Radiologist Assistant, and communication documented in the PACS or [employer]. Electronically Signed   By: Primitivo Gauze M.D.   On: 02/07/2020 08:10    Labs: BMET Recent Labs  Lab 02/03/20 1128 02/04/20 0539 02/05/20 0500 02/06/20 0515 02/07/20 0342 02/08/20 0411  NA 130* 130* 131* 130* 132* 134*  K 4.0 4.1 3.1* 3.2* 4.0 3.0*  CL 99 99 101 100 102 104  CO2 21* 20* 19* 18* 17* 16*  GLUCOSE 202* 186* 174* 155* 175* 125*  BUN 43* 50* 55* 58* 69* 83*  CREATININE 2.55* 3.49* 3.55* 4.15* 4.75* 5.82*  CALCIUM 8.0* 8.4* 7.8* 8.0* 8.2* 8.0*    CBC Recent Labs  Lab 02/03/20 1128 02/04/20 0539 02/05/20 0500 02/06/20 0515 02/07/20 0342 02/08/20 0411  WBC 0.2*   < > 0.2* 0.3* 0.7* 2.4*  NEUTROABS 0.0*  --   --   --  0.3* 1.0*  HGB 8.6*   < > 9.6* 10.0* 10.3* 10.0*  HCT 26.0*   < > 27.2* 28.3* 30.2* 29.5*  MCV 102.4*   < > 93.8 95.3 98.1 98.3  PLT 30*   < > 15* 24* 23* 22*   < > = values in this interval not displayed.    Medications:     sodium chloride   Intravenous Once   atorvastatin  40 mg Oral QPM   Chlorhexidine Gluconate Cloth  6 each Topical Daily   metoprolol tartrate  5 mg Intravenous Q6H   oxybutynin  10 mg Oral QHS   pyridOXINE  200 mg Oral Q breakfast   saccharomyces boulardii  250 mg Oral BID      Otelia Santee, MD 02/08/2020, 11:37 AM

## 2020-02-08 NOTE — Progress Notes (Signed)
PROGRESS NOTE    [name] Rose  NKN:[id number] DOB: [birth year] DOA: 02/03/2020 PCP: Shirline Frees, MD   Chief Complain: Weakness  Brief Narrative: Patient is a 69 year old male with history of metastatic bladder cancer, currently on chemotherapy, paroxysmal A. fib, hypertension, CKD stage IV who presents to the emergency department complaints of generalized weakness and diarrhea for last 4 to 5 days.  He complains of decreased appetite but no abdominal pain, fevers or chills.  He has been falling frequently at home.  When EMS saw him, he was hypotensive.  On presentation he was hypotensive.  Lab work showed severe leukopenia, thrombocytopenia.  Patient was admitted for the management of sepsis secondary to neutropenia and started on cefepime.  Oncology consulted.  Blood cultures  showing Klebsiella pneumonia.  Hospital course remarkable for progressive AKI so nephrology consulted .  Cardiology also following.  02/08/2020: Patient seen.  Available records reviewed.  Input from oncology, cardiology and nephrology team appreciated.  Worsening renal function is noted.  Concerns that patient may need renal replacement therapy.  Ileus is slowly improving.  Patient reported having broken wind.  NG tube to low suction still in situ.  Neutropenia has resolved.  Severe thrombocytopenia persists.  Assessment & Plan:   Principal Problem:   Failure to thrive in adult Active Problems:   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 (HCC)   Diarrhea   Chronic a-fib (HCC)   Pancytopenia (HCC)   Fall at home, initial encounter   Atrial fibrillation with RVR (HCC)   Pancytopenia: WBC 0.2, platelets of 30 on presentation.  Pancytopenia secondary to chemotherapy.  Oncology consulted and following.  Continue to monitor CBC.  Transfused with 2 units of PRBC during this hospitalization. He is also being given Granix. He has severe thrombocytopenia and was  transfused with platelets during  this hopsitalization. 02/08/2020: WBC today is 2.4, hemoglobin of 10 and platelet count of 22,000.    Severe sepsis secondary to gram-negative bacteremia (Klebsiella pneumonia):Blood cultures now showed pansensitive  Klebsiella pneumonia.On  Cefazoline,will change it to oral to complete 2 weeks course when patient's overall status improves.  Continue cefazolin for now.  Blood pressure stable.  Continue IV fluids. 02/08/2020: Patient has severe sepsis.  BUN is 83, with serum creatinine of 5.8 serum potassium of 3.  CO2 16.  Nephrology team is managing.  AKI on CKD stage IV: Baseline creatinine around 2.5.Creatinine bumped up to the range of 4  .  Continue   IV fluids.  He was complaining of severe diarrhea from chemotherapy and it has been going on for several days.  He looked  volume depleted and dehydrated  .  AKI continues to worsen despite being given IV fluids.  Renal ultrasound did not show any abnormalities.  Urine sodium was 22.  Nephrology consulted and following.  He follows with [facility] as an outpatient.   02/08/2020: Nephrology team is directing care.  Patient may eventually need renal replacement therapy.  Permanent  A. fib/A. fib with RVR: Was in A. fib on 02/06/2020.   Cardizem, Toprol at home which have been resumed .  On Eliquis for anticoagulation at home. Eliquis was held due to worsening thrombocytopenia.  We will restart when platelets are more than 30,000.  Tikosyn also held because of worsening kidney function.  Cardiology has been consulted and following  Diarrhea/Abd distension: Most likely associated with recent chemotherapy.  C. difficile negative.   Hospital course remarkable for severe diarrhea which has stopped but he has  developed new abdominal distention.  Abdominal x-ray shows possible ileus.  Will check CT abdomen/pelvis without contrast. 02/08/2020: Patient is currently being managed conservatively for ileus.  No diarrhea.  Failure to thrive/multiple falls:  Decreased appetite, falling frequently at home.    PT consulted.   Continue gentle IV fluids.  Metastatic bladder cancer: Follows with oncology, [name].  On chemotherapy.  He also underwent tumor resection by urology in the past.  Hypertension:   He was hypotensive earlier on presentation,we have resumed his home medications now.BP stable now  Addendum:  CT abdomen/pelvis without contrast showed diffuse dilation of small bowel loops throughout the abdomen to distal ileum.  Questionable obstruction.  Will start on conservative management with NG tube placement, intermittent suction.  If no improvement with conservative therapy, will consider for general surgery evaluation.Lets keep NPO.will check KUB tomorow         DVT prophylaxis:SCD Code Status: Full Family Communication: wife at bedside on 10.27.21 Status is: Inpatient  Remains inpatient appropriate because:Inpatient level of care appropriate due to severity of illness   Dispo: The patient is from: Home              Anticipated d/c is to: Home              Anticipated d/c date is: 3-4 days              Patient currently is not medically stable to d/c.     Consultants:  oncology Nephrology Cardiology  Procedures:None  Antimicrobials:  Anti-infectives (From admission, onward)   Start     Dose/Rate Route Frequency Ordered Stop   02/06/20 1200  ceFAZolin (ANCEF) IVPB 2g/100 mL premix        2 g 200 mL/hr over 30 Minutes Intravenous Every 12 hours 02/06/20 1103     02/05/20 0600  ceFEPIme (MAXIPIME) 2 g in sodium chloride 0.9 % 100 mL IVPB  Status:  Discontinued        2 g 200 mL/hr over 30 Minutes Intravenous Every 24 hours 02/04/20 0719 02/04/20 1150   02/04/20 1400  cefTRIAXone (ROCEPHIN) 2 g in sodium chloride 0.9 % 100 mL IVPB  Status:  Discontinued        2 g 200 mL/hr over 30 Minutes Intravenous Every 24 hours 02/04/20 1150 02/06/20 1103   02/03/20 1830  ceFEPIme (MAXIPIME) 2 g in sodium chloride 0.9 % 100  mL IVPB  Status:  Discontinued        2 g 200 mL/hr over 30 Minutes Intravenous Every 12 hours 02/03/20 1825 02/04/20 0719      Subjective: -No new complaints. -Patient remains ill.  Objective: Vitals:   02/07/20 1220 02/07/20 2115 02/08/20 0539 02/08/20 1212  BP: 121/78 112/64 138/79 124/75  Pulse: 95 93 98 (!) 101  Resp: (!) $RemoveB'22 20 20 19  'YkJdJtjm$ Temp: 97.8 F (36.6 C) (!) 96.6 F (35.9 C) 97.8 F (36.6 C) 97.6 F (36.4 C)  TempSrc:   Oral   SpO2: 98% 100% 96% 94%  Weight:      Height:        Intake/Output Summary (Last 24 hours) at 02/08/2020 1749 Last data filed at 02/08/2020 1713 Gross per 24 hour  Intake 844.86 ml  Output 4625 ml  Net -3780.14 ml   Filed Weights   02/03/20 1058 02/03/20 1416  Weight: 122.5 kg 121.5 kg    Examination:   General exam: Weak, acutely ill looking.  Obese Respiratory system: Decreased air entry. Cardiovascular system:  Irregularly irregular rhythm.  JVD is difficult to assess.  Neck is supple. Gastrointestinal system: Abdomen is distended, soft and nontender.  Significantly decreased bowel sounds.  Organs are difficult to assess.   Central nervous system: Alert and oriented.  Patient moves all extremities.    Data Reviewed: I have personally reviewed following labs and imaging studies  CBC: Recent Labs  Lab 02/03/20 1128 02/03/20 1128 02/04/20 0539 02/05/20 0500 02/06/20 0515 02/07/20 0342 02/08/20 0411  WBC 0.2*   < > 0.2* 0.2* 0.3* 0.7* 2.4*  NEUTROABS 0.0*  --   --   --   --  0.3* 1.0*  HGB 8.6*   < > 7.7* 9.6* 10.0* 10.3* 10.0*  HCT 26.0*   < > 22.3* 27.2* 28.3* 30.2* 29.5*  MCV 102.4*   < > 100.0 93.8 95.3 98.1 98.3  PLT 30*   < > 15* 15* 24* 23* 22*   < > = values in this interval not displayed.   Basic Metabolic Panel: Recent Labs  Lab 02/03/20 1128 02/03/20 1128 02/04/20 0539 02/05/20 0500 02/06/20 0515 02/07/20 0342 02/08/20 0411  NA 130*   < > 130* 131* 130* 132* 134*  K 4.0   < > 4.1 3.1* 3.2* 4.0 3.0*    CL 99   < > 99 101 100 102 104  CO2 21*   < > 20* 19* 18* 17* 16*  GLUCOSE 202*   < > 186* 174* 155* 175* 125*  BUN 43*   < > 50* 55* 58* 69* 83*  CREATININE 2.55*   < > 3.49* 3.55* 4.15* 4.75* 5.82*  CALCIUM 8.0*   < > 8.4* 7.8* 8.0* 8.2* 8.0*  MG 1.5*  --   --  1.7  --  2.3  --    < > = values in this interval not displayed.   GFR: Estimated Creatinine Clearance: 16.8 mL/min (A) (by C-G formula based on SCr of 5.82 mg/dL (H)). Liver Function Tests: Recent Labs  Lab 02/03/20 1128 02/08/20 0411  AST 25 14*  ALT 33 9  ALKPHOS 69 44  BILITOT 1.2 0.7  PROT 5.4* 4.9*  ALBUMIN 3.0* 2.3*   No results for input(s): LIPASE, AMYLASE in the last 168 hours. No results for input(s): AMMONIA in the last 168 hours. Coagulation Profile: No results for input(s): INR, PROTIME in the last 168 hours. Cardiac Enzymes: No results for input(s): CKTOTAL, CKMB, CKMBINDEX, TROPONINI in the last 168 hours. BNP (last 3 results) No results for input(s): PROBNP in the last 8760 hours. HbA1C: No results for input(s): HGBA1C in the last 72 hours. CBG: Recent Labs  Lab 02/07/20 1656 02/07/20 2112 02/08/20 0738 02/08/20 1125 02/08/20 1632  GLUCAP 153* 141* 117* 116* 135*   Lipid Profile: No results for input(s): CHOL, HDL, LDLCALC, TRIG, CHOLHDL, LDLDIRECT in the last 72 hours. Thyroid Function Tests: No results for input(s): TSH, T4TOTAL, FREET4, T3FREE, THYROIDAB in the last 72 hours. Anemia Panel: No results for input(s): VITAMINB12, FOLATE, FERRITIN, TIBC, IRON, RETICCTPCT in the last 72 hours. Sepsis Labs: No results for input(s): PROCALCITON, LATICACIDVEN in the last 168 hours.  Recent Results (from the past 240 hour(s))  Respiratory Panel by RT PCR (Flu A&B, Covid) - Nasopharyngeal Swab     Status: None   Collection Time: 02/03/20 11:28 AM   Specimen: Nasopharyngeal Swab  Result Value Ref Range Status   SARS Coronavirus 2 by RT PCR NEGATIVE NEGATIVE Final    Comment:  (NOTE) SARS-CoV-2 target nucleic acids are  NOT DETECTED.  The SARS-CoV-2 RNA is generally detectable in upper respiratoy specimens during the acute phase of infection. The lowest concentration of SARS-CoV-2 viral copies this assay can detect is 131 copies/mL. A negative result does not preclude SARS-Cov-2 infection and should not be used as the sole basis for treatment or other patient management decisions. A negative result may occur with  improper specimen collection/handling, submission of specimen other than nasopharyngeal swab, presence of viral mutation(s) within the areas targeted by this assay, and inadequate number of viral copies (<131 copies/mL). A negative result must be combined with clinical observations, patient history, and epidemiological information. The expected result is Negative.  Fact Sheet for Patients:  PinkCheek.be  Fact Sheet for Healthcare Providers:  GravelBags.it  This test is no t yet approved or cleared by the [place] FDA and  has been authorized for detection and/or diagnosis of SARS-CoV-2 by FDA under an Emergency Use Authorization (EUA). This EUA will remain  in effect (meaning this test can be used) for the duration of the COVID-19 declaration under Section 564(b)(1) of the Act, 21 U.S.C. section 360bbb-3(b)(1), unless the authorization is terminated or revoked sooner.     Influenza A by PCR NEGATIVE NEGATIVE Final   Influenza B by PCR NEGATIVE NEGATIVE Final    Comment: (NOTE) The Xpert Xpress SARS-CoV-2/FLU/RSV assay is intended as an aid in  the diagnosis of influenza from Nasopharyngeal swab specimens and  should not be used as a sole basis for treatment. Nasal washings and  aspirates are unacceptable for Xpert Xpress SARS-CoV-2/FLU/RSV  testing.  Fact Sheet for Patients: PinkCheek.be  Fact Sheet for Healthcare  Providers: GravelBags.it  This test is not yet approved or cleared by the [place] FDA and  has been authorized for detection and/or diagnosis of SARS-CoV-2 by  FDA under an Emergency Use Authorization (EUA). This EUA will remain  in effect (meaning this test can be used) for the duration of the  Covid-19 declaration under Section 564(b)(1) of the Act, 21  U.S.C. section 360bbb-3(b)(1), unless the authorization is  terminated or revoked. Performed at [facility], [place] [address], [place], [place] [address]   Culture, blood (x 2)     Status: Abnormal   Collection Time: 02/03/20  6:50 PM   Specimen: BLOOD RIGHT HAND  Result Value Ref Range Status   Specimen Description   Final    BLOOD RIGHT HAND Performed at [facility] [address], [place], [place] [address]    Special Requests   Final    BOTTLES DRAWN AEROBIC ONLY Blood Culture results may not be optimal due to an inadequate volume of blood received in culture bottles Performed at [facility] [address], [place], [place] [address]    Culture  Setup Time   Final    GRAM NEGATIVE RODS AEROBIC BOTTLE ONLY CRITICAL VALUE NOTED.  VALUE IS CONSISTENT WITH PREVIOUSLY REPORTED AND CALLED VALUE.    Culture (A)  Final    KLEBSIELLA PNEUMONIAE SUSCEPTIBILITIES PERFORMED ON PREVIOUS CULTURE WITHIN THE LAST 5 DAYS. Performed at [facility], [place] [address], [place], [place] [address]    Report Status 02/06/2020 FINAL  Final  Culture, blood (x 2)     Status: Abnormal   Collection Time: 02/03/20  6:50 PM   Specimen: BLOOD  Result Value Ref Range Status   Specimen Description   Final    BLOOD RIGHT ANTECUBITAL Performed at [facility], [place]  [address] [place], [place] [address]    Special Requests   Final    BOTTLES DRAWN AEROBIC ONLY Blood Culture results may not be optimal due to an excessive volume of blood received in  culture bottles Performed at [facility] [address] [place], [place] [address]    Culture  Setup Time   Final    GRAM NEGATIVE RODS AEROBIC BOTTLE ONLY CRITICAL RESULT CALLED TO, READ BACK BY AND VERIFIED WITH: T,GREEN PHARMD $RemoveBefore'@1113'[email or web address]$  02/04/20 EB Performed at [facility] [address] [place],  [address]    Culture KLEBSIELLA PNEUMONIAE (A)  Final   Report Status 02/06/2020 FINAL  Final   Organism ID, Bacteria KLEBSIELLA PNEUMONIAE  Final      Susceptibility   Klebsiella pneumoniae - MIC*    AMPICILLIN >=32 RESISTANT Resistant     CEFAZOLIN <=4 SENSITIVE Sensitive     CEFEPIME <=0.12 SENSITIVE Sensitive     CEFTAZIDIME <=1 SENSITIVE Sensitive     CEFTRIAXONE <=0.25 SENSITIVE Sensitive     CIPROFLOXACIN <=0.25 SENSITIVE Sensitive     GENTAMICIN <=1 SENSITIVE Sensitive     IMIPENEM <=0.25 SENSITIVE Sensitive     TRIMETH/SULFA <=20 SENSITIVE Sensitive     AMPICILLIN/SULBACTAM 4 SENSITIVE Sensitive     PIP/TAZO <=4 SENSITIVE Sensitive     * KLEBSIELLA PNEUMONIAE  Blood Culture ID Panel (Reflexed)     Status: Abnormal   Collection Time: 02/03/20  6:50 PM  Result Value Ref Range Status   Enterococcus faecalis NOT DETECTED NOT DETECTED Final   Enterococcus Faecium NOT DETECTED NOT DETECTED Final   Listeria monocytogenes NOT DETECTED NOT DETECTED Final   Staphylococcus species NOT DETECTED NOT DETECTED Final   Staphylococcus aureus (BCID) NOT DETECTED NOT DETECTED Final   Staphylococcus epidermidis NOT DETECTED NOT DETECTED Final   Staphylococcus lugdunensis NOT DETECTED NOT DETECTED Final   Streptococcus species NOT DETECTED NOT DETECTED Final   Streptococcus agalactiae NOT DETECTED NOT DETECTED Final   Streptococcus pneumoniae NOT DETECTED NOT DETECTED Final   Streptococcus pyogenes NOT DETECTED NOT DETECTED Final   A.calcoaceticus-baumannii NOT DETECTED NOT DETECTED Final   Bacteroides fragilis NOT DETECTED NOT DETECTED Final    Enterobacterales DETECTED (A) NOT DETECTED Final    Comment: Enterobacterales represent a large order of gram negative bacteria, not a single organism. CRITICAL RESULT CALLED TO, READ BACK BY AND VERIFIED WITH: T,GREEN PHARMD $RemoveBefore'@1113'[id number]$  02/04/20 EB    Enterobacter cloacae complex NOT DETECTED NOT DETECTED Final   Escherichia coli NOT DETECTED NOT DETECTED Final   Klebsiella aerogenes NOT DETECTED NOT DETECTED Final   Klebsiella oxytoca NOT DETECTED NOT DETECTED Final   Klebsiella pneumoniae DETECTED (A) NOT DETECTED Final    Comment: CRITICAL RESULT CALLED TO, READ BACK BY AND VERIFIED WITH: T,GREEN PHARMD $RemoveBefore'@1113'[email or web address]$  02/04/20 EB    Proteus species NOT DETECTED NOT DETECTED Final   Salmonella species NOT DETECTED NOT DETECTED Final   Serratia marcescens NOT DETECTED NOT DETECTED Final   Haemophilus influenzae NOT DETECTED NOT DETECTED Final   Neisseria meningitidis NOT DETECTED NOT DETECTED Final   Pseudomonas aeruginosa NOT DETECTED NOT DETECTED Final   Stenotrophomonas maltophilia NOT DETECTED NOT DETECTED Final   Candida albicans NOT DETECTED NOT DETECTED Final   Candida auris NOT DETECTED NOT DETECTED Final   Candida glabrata NOT DETECTED NOT DETECTED Final   Candida krusei NOT DETECTED NOT DETECTED Final   Candida parapsilosis NOT DETECTED NOT DETECTED Final   Candida tropicalis NOT DETECTED NOT DETECTED Final  Cryptococcus neoformans/gattii NOT DETECTED NOT DETECTED Final   CTX-M ESBL NOT DETECTED NOT DETECTED Final   Carbapenem resistance IMP NOT DETECTED NOT DETECTED Final   Carbapenem resistance KPC NOT DETECTED NOT DETECTED Final   Carbapenem resistance NDM NOT DETECTED NOT DETECTED Final   Carbapenem resist OXA 48 LIKE NOT DETECTED NOT DETECTED Final   Carbapenem resistance VIM NOT DETECTED NOT DETECTED Final    Comment: Performed at [facility], [place] [address], [place], [place] [address]  C Difficile Quick Screen w PCR reflex     Status: None   Collection Time: 02/04/20   5:07 PM   Specimen: STOOL  Result Value Ref Range Status   C Diff antigen NEGATIVE NEGATIVE Final   C Diff toxin NEGATIVE NEGATIVE Final   C Diff interpretation No C. difficile detected.  Final    Comment: Performed at [facility], [place] [address] [place], [place] [address]         Radiology Studies: CT ABDOMEN PELVIS WO CONTRAST  Result Date: 02/07/2020 CLINICAL DATA:  Generalized weakness, diarrhea, decreased appetite, hypotensive, leukopenia, thrombocytopenia, history metastatic bladder cancer, paroxysmal atrial fibrillation, hypertension, stage IV chronic kidney disease, question bowel obstruction EXAM: CT ABDOMEN AND PELVIS WITHOUT CONTRAST TECHNIQUE: Multidetector CT imaging of the abdomen and pelvis was performed following the standard protocol without IV contrast. Sagittal and coronal MPR images reconstructed from axial data set. Water as oral contrast. COMPARISON:  01/05/2018 FINDINGS: Lower chest: RIGHT lower lobe bronchiectasis. Bibasilar atelectasis. Small LEFT pleural effusion. Hepatobiliary: Low-attenuation foci within liver question cysts unchanged, largest 15 x 14 mm lateral segment LEFT lobe image 19. Gallbladder unremarkable. Pancreas: Atrophic pancreas without mass Spleen: Normal appearance Adrenals/Urinary Tract: Adrenal glands, kidneys, and ureters normal appearance. Bladder wall appears thickened but bladder is underdistended, question artifact. Stomach/Bowel: Minimal sigmoid diverticulosis without evidence of diverticulitis. Diffuse dilatation of small bowel loops throughout abdomen. Stomach distended by fluid. Fluid within distended thus distal esophagus question reflux. Small bowel loops are but question of bowel wall thickening of the distal ileum is raised. Mild gaseous distension of ascending and transverse colon. Descending and sigmoid colon decompressed without discrete transition zone. Vascular/Lymphatic: Atherosclerotic calcifications aorta and  iliac arteries without aneurysm. Coronary arterial calcifications. No adenopathy. Reproductive: Prior prostatectomy. Other: No free air. Small amount of fluid at RIGHT gutter. Minimal stranding of mesentery. Musculoskeletal: Degenerative disc disease changes thoracolumbar spine. IMPRESSION: Diffuse dilatation of small bowel loops throughout abdomen to distal ileum question obstruction. Suspected bowel wall thickening of distal ileum, uncertain etiology but could represent inflammatory bowel disease or infection, tumor considered less likely due to length of involvement; suspect this is responsible for the observed component of small-bowel obstruction. Small amount of nonspecific free fluid. Minimal sigmoid diverticulosis without evidence of diverticulitis. RIGHT lower lobe bronchiectasis with bibasilar atelectasis and small LEFT pleural effusion. Aortic Atherosclerosis (ICD10-I70.0). Electronically Signed   By: Lavonia Dana M.D.   On: 02/07/2020 13:59   DG Abd 1 View  Result Date: 02/08/2020 CLINICAL DATA:  Follow-up ileus. EXAM: ABDOMEN - 1 VIEW COMPARISON:  Abdomen 02/07/2020.  CT 02/07/2020. FINDINGS: NG tube noted with tip over stomach. Persistent prominently dilated loops of small bowel noted. Colonic dilatation also noted. No free air identified. Degenerative change in scoliosis lumbar spine. Degenerative changes right hip. Total left hip replacement. IMPRESSION: 1.  NG tube noted with tip over stomach. 2. Persistent prominently dilated loops of small bowel again noted. Colonic dilatation also noted. Similar findings noted on prior exams.  Findings consistent with persistent ileus. Continued follow-up exam suggested to demonstrate resolution and to exclude bowel obstruction. Electronically Signed   By: Marcello Moores  Register   On: 02/08/2020 07:35   DG Abd Portable 1V  Result Date: 02/07/2020 CLINICAL DATA:  69 year old male encounter for NG placement. EXAM: PORTABLE ABDOMEN - 1 VIEW COMPARISON:  Earlier  radiograph dated 02/07/2020. FINDINGS: Interval advancement of the enteric tube with side-port and tip in the proximal stomach. Partially visualized central venous line with tip over the cavoatrial junction. IMPRESSION: Enteric tube with tip in the proximal stomach. Electronically Signed   By: Anner Crete M.D.   On: 02/07/2020 17:43   DG Abd Portable 1V  Result Date: 02/07/2020 CLINICAL DATA:  NG tube placement. EXAM: PORTABLE ABDOMEN - 1 VIEW COMPARISON:  CT 02/07/2020. FINDINGS: PowerPort catheter noted tip over cavoatrial junction. NG tube noted with tip at the gastroesophageal junction. Consider advancement of approximately 15 cm. Dilated loops of small bowel again noted. No free air identified. IMPRESSION: 1. NG tube with tip at the gastroesophageal junction. Consider advancement of approximately 15 cm. 2. Dilated loops of small bowel again noted. Electronically Signed   By: Marcello Moores  Register   On: 02/07/2020 16:22   DG Abd Portable 1V  Result Date: 02/07/2020 CLINICAL DATA:  Abdominal distension. EXAM: PORTABLE ABDOMEN - 1 VIEW COMPARISON:  12/20/2019 nuclear medicine PET. FINDINGS: Dilated small and large bowel loops. No pneumoperitoneum. No air-fluid levels. No suspicious calcific densities. Multilevel spondylosis. Sequela of left hip arthroplasty. IMPRESSION: Dilated small and large bowel loops, concerning for ileus versus obstruction. These results will be called to the ordering clinician or representative by the Radiologist Assistant, and communication documented in the PACS or [employer]. Electronically Signed   By: Primitivo Gauze M.D.   On: 02/07/2020 08:10        Scheduled Meds: . sodium chloride   Intravenous Once  . atorvastatin  40 mg Oral QPM  . Chlorhexidine Gluconate Cloth  6 each Topical Daily  . metoprolol tartrate  5 mg Intravenous Q6H  . oxybutynin  10 mg Oral QHS  . pyridOXINE  200 mg Oral Q breakfast  . saccharomyces boulardii  250 mg Oral BID    Continuous Infusions: .  ceFAZolin (ANCEF) IV 2 g (02/08/20 1124)  . sodium bicarbonate (isotonic) 150 mEq in D5W 1000 mL infusion 125 mL/hr at 02/08/20 1246     LOS: 5 days    Time spent: 35 mins.   Bonnell Public, MD [facility] P10/29/2021, 5:49 PM

## 2020-02-09 ENCOUNTER — Inpatient Hospital Stay (HOSPITAL_COMMUNITY): Payer: PPO

## 2020-02-09 DIAGNOSIS — E876 Hypokalemia: Secondary | ICD-10-CM

## 2020-02-09 DIAGNOSIS — I4819 Other persistent atrial fibrillation: Secondary | ICD-10-CM | POA: Diagnosis not present

## 2020-02-09 DIAGNOSIS — D696 Thrombocytopenia, unspecified: Secondary | ICD-10-CM | POA: Diagnosis not present

## 2020-02-09 DIAGNOSIS — R627 Adult failure to thrive: Secondary | ICD-10-CM | POA: Diagnosis not present

## 2020-02-09 LAB — COMPREHENSIVE METABOLIC PANEL
ALT: <5 U/L (ref 0–44)
AST: 17 U/L (ref 15–41)
Albumin: 2.1 g/dL — ABNORMAL LOW (ref 3.5–5.0)
Alkaline Phosphatase: 45 U/L (ref 38–126)
Anion gap: 16 — ABNORMAL HIGH (ref 5–15)
BUN: 99 mg/dL — ABNORMAL HIGH (ref 8–23)
CO2: 23 mmol/L (ref 22–32)
Calcium: 7.7 mg/dL — ABNORMAL LOW (ref 8.9–10.3)
Chloride: 98 mmol/L (ref 98–111)
Creatinine, Ser: 6.82 mg/dL — ABNORMAL HIGH (ref 0.61–1.24)
GFR, Estimated: 8 mL/min — ABNORMAL LOW (ref >60)
Glucose, Bld: 165 mg/dL — ABNORMAL HIGH (ref 70–99)
Potassium: 2.4 mmol/L — CL (ref 3.5–5.1)
Sodium: 137 mmol/L (ref 135–145)
Total Bilirubin: 0.7 mg/dL (ref 0.3–1.2)
Total Protein: 4.6 g/dL — ABNORMAL LOW (ref 6.5–8.1)

## 2020-02-09 LAB — CBC WITH DIFFERENTIAL/PLATELET
Abs Immature Granulocytes: 0.17 10*3/uL — ABNORMAL HIGH (ref 0.00–0.07)
Basophils Absolute: 0 10*3/uL (ref 0.0–0.1)
Basophils Relative: 1 %
Eosinophils Absolute: 0.5 10*3/uL (ref 0.0–0.5)
Eosinophils Relative: 10 %
HCT: 25.9 % — ABNORMAL LOW (ref 39.0–52.0)
Hemoglobin: 9.1 g/dL — ABNORMAL LOW (ref 13.0–17.0)
Immature Granulocytes: 4 %
Lymphocytes Relative: 12 %
Lymphs Abs: 0.5 10*3/uL — ABNORMAL LOW (ref 0.7–4.0)
MCH: 33.5 pg (ref 26.0–34.0)
MCHC: 35.1 g/dL (ref 30.0–36.0)
MCV: 95.2 fL (ref 80.0–100.0)
Monocytes Absolute: 0.5 10*3/uL (ref 0.1–1.0)
Monocytes Relative: 12 %
Neutro Abs: 2.7 10*3/uL (ref 1.7–7.7)
Neutrophils Relative %: 61 %
Platelets: 21 10*3/uL — CL (ref 150–400)
RBC: 2.72 MIL/uL — ABNORMAL LOW (ref 4.22–5.81)
RDW: 14.9 % (ref 11.5–15.5)
WBC: 4.4 10*3/uL (ref 4.0–10.5)
nRBC: 0 % (ref 0.0–0.2)

## 2020-02-09 LAB — GLUCOSE, CAPILLARY: Glucose-Capillary: 171 mg/dL — ABNORMAL HIGH (ref 70–99)

## 2020-02-09 MED ORDER — POTASSIUM CHLORIDE 10 MEQ/100ML IV SOLN
10.0000 meq | Freq: Once | INTRAVENOUS | Status: AC
  Administered 2020-02-09: 10 meq via INTRAVENOUS

## 2020-02-09 MED ORDER — SODIUM CHLORIDE 0.9 % IV SOLN: INTRAVENOUS | Status: DC

## 2020-02-09 MED ORDER — POTASSIUM CHLORIDE 10 MEQ/100ML IV SOLN
10.0000 meq | INTRAVENOUS | Status: AC
  Administered 2020-02-09 (×4): 10 meq via INTRAVENOUS
  Filled 2020-02-09 (×4): qty 100

## 2020-02-09 MED ORDER — POTASSIUM CHLORIDE 10 MEQ/100ML IV SOLN
10.0000 meq | INTRAVENOUS | Status: DC
  Filled 2020-02-09: qty 100

## 2020-02-09 MED ORDER — POTASSIUM CHLORIDE 10 MEQ/100ML IV SOLN
10.0000 meq | INTRAVENOUS | Status: AC
  Administered 2020-02-09 (×3): 10 meq via INTRAVENOUS
  Filled 2020-02-09 (×3): qty 100

## 2020-02-09 NOTE — Progress Notes (Signed)
CRITICAL VALUE ALERT  Critical Value: Potasium 2.4  Date & Time Notied: 0037 on 02/09/20  Provider Notified: Yes  Orders Received/Actions taken:Awaiting new orders

## 2020-02-09 NOTE — Progress Notes (Signed)
Thankfully, his white cell count is doing much better.  His white cell count is 4.4.  His hemoglobin is 9.1.  Platelet count is still little on the low side at 21,000.  His BUN and creatinine continue to go up.  I suspect he is going to end up on dialysis.  His BUN is 99 creatinine 6.8.  He had a abdominal x-ray on 1029.  This basically showed similar findings with dilated loops of small bowel.  There is also some colonic dilation.  He still has the NG tube in.  I would like to think that the Klebsiella bacteremia is improved.  I would get a blood culture on him to see what is going on.  His potassium was 2.4.  At some point, he may need to be fed via TNA if this ileus/obstruction cannot resolve.  His vital signs show temperature of 97.7.  Pulse 80.  Blood pressure 131/84.  His abdomen is a little distended.  Bowel sounds are just very very little.  He has no guarding.  His cardiac exam regular rate and rhythm.  I really cannot hear much of the atrial fibrillation.  Lungs sound clear bilaterally.  I just feel bad that he has a progressive renal insufficiency.  Again I had to suspect that he probably got a end up on dialysis.  I am little surprised that the potassium is on the low side.  I am still not sure as to why he has the ileus.  Maybe, this was somehow related to the bacteremia.  I would like to think that the platelets will improve gradually.  I know he is getting outstanding care from all the staff up on [place], [place]  Psalm 30:2

## 2020-02-09 NOTE — Progress Notes (Signed)
°[facility] °Progress Note  ° °69 y.o. year-old afib, HTN, CKD 4 baseline creat is 1.9- 2.6 from 2021and bladder cancer w/ mets on chemoRx presented on 10/24 w/ gen'd weakness, diarrhea for 5 days, no appetite or fevers.  BP soft 99/70, WBC 0.21, creat 2.55, plt 30.  Seen by [facility], got Granix, getting plts today , sp 2u prbc. Pt blood cx's grew klebs pna and pt is on IVF's and IV abx.  Creat on admit 2.5 >> up to 3.5 ->  4.1.  Urine output trend going the wrong direction. Was on dialysis in June 2021 temporarily as well. ° °Assessment/ Plan:   °1. AoCKD 4 - Baseline creat is 1.9- 2.6 from 2021, eGFR 26- 34 ml/min. Creat 2.5 on admit and has steadily increased despite aggressive isotonic resuscitation.  AKI likely due to sepsis/ hypoperfusion.  ° °- Function continues to worsen and in ATN;  he has required dialysis in 09/2019 as well.  °- Will stop the HCO3 which is causing a shift; NGT suction also causing a shift. Will change to NS at 150/hr given the high output from NGT. Replete K as needed keeping in mind there is an intracellular shift occurring. ° °- Check bladder scan PVR to ensure not retaining. °  °- He is still feeling better but with renal function still trending wrong direction he may still require RRT temporarily. I spoke with him at length today and he wishes to hold off RRT till Monday if necessary. There is no absolute indication to initiate dialysis but If no improvement in next 24-48hrs or if he develops indications for RRT then would  transfer to [facility] for iHD on Monday.   ° °- Platelets low and would need a transfusion before VIR places a catheter. ° ° °2. Klebs pna sepsis - BP's wnl now, on IV abx, not sure source, possibly just d/t neutropenia. °3. Neutropenia/ pancytopenia - per pmd, ONC °4. Atrial fib - home meds are on hold for now given hypotension °5. Metastatic bladder cancer - per pmd ° °Subjective:   °Feeling much better after the NGT to wall suction placed °Still denies  dyspnea/ CP/ F/C/N and although he has a good appetite despite being NPO. ° °3300/1250 out from NG past 2 days.  ° °Objective:   °BP 131/84    Pulse 80    Temp 97.7 °F (36.5 °C)    Resp 20    Ht 6' 2" (1.88 m)    Wt 121.5 kg    SpO2 93%    BMI 34.38 kg/m²  ° °Intake/Output Summary (Last 24 hours) at 02/09/2020 1130 °Last data filed at 02/09/2020 0600 °Gross per 24 hour  °Intake 2480.04 ml  °Output 1550 ml  °Net 930.04 ml  ° °Weight change:  ° °Physical Exam: °Gen alert, supine °Chest clear bilat to bases  °CV irreg irreg no MRG °Abd soft ntnd no mass or ascites, NGT °Ext tr edema, no wounds or ulcers  °Neuro is alert, Ox 3 , nf, no asterixis ° °Imaging: °CT ABDOMEN PELVIS WO CONTRAST ° °Result Date: 02/07/2020 °CLINICAL DATA:  Generalized weakness, diarrhea, decreased appetite, hypotensive, leukopenia, thrombocytopenia, history metastatic bladder cancer, paroxysmal atrial fibrillation, hypertension, stage IV chronic kidney disease, question bowel obstruction EXAM: CT ABDOMEN AND PELVIS WITHOUT CONTRAST TECHNIQUE: Multidetector CT imaging of the abdomen and pelvis was performed following the standard protocol without IV contrast. Sagittal and coronal MPR images reconstructed from axial data set. Water as oral contrast. COMPARISON:  01/05/2018 FINDINGS: Lower chest: RIGHT lower lobe bronchiectasis. Bibasilar atelectasis. Small LEFT pleural effusion. Hepatobiliary: Low-attenuation foci within liver question cysts unchanged, largest 15 x 14   mm lateral segment LEFT lobe image 19. Gallbladder unremarkable. Pancreas: Atrophic pancreas without mass Spleen: Normal appearance Adrenals/Urinary Tract: Adrenal glands, kidneys, and ureters normal appearance. Bladder wall appears thickened but bladder is underdistended, question artifact. Stomach/Bowel: Minimal sigmoid diverticulosis without evidence of diverticulitis. Diffuse dilatation of small bowel loops throughout abdomen. Stomach distended by fluid. Fluid within distended  thus distal esophagus question reflux. Small bowel loops are but question of bowel wall thickening of the distal ileum is raised. Mild gaseous distension of ascending and transverse colon. Descending and sigmoid colon decompressed without discrete transition zone. Vascular/Lymphatic: Atherosclerotic calcifications aorta and iliac arteries without aneurysm. Coronary arterial calcifications. No adenopathy. Reproductive: Prior prostatectomy. Other: No free air. Small amount of fluid at RIGHT gutter. Minimal stranding of mesentery. Musculoskeletal: Degenerative disc disease changes thoracolumbar spine. IMPRESSION: Diffuse dilatation of small bowel loops throughout abdomen to distal ileum question obstruction. Suspected bowel wall thickening of distal ileum, uncertain etiology but could represent inflammatory bowel disease or infection, tumor considered less likely due to length of involvement; suspect this is responsible for the observed component of small-bowel obstruction. Small amount of nonspecific free fluid. Minimal sigmoid diverticulosis without evidence of diverticulitis. RIGHT lower lobe bronchiectasis with bibasilar atelectasis and small LEFT pleural effusion. Aortic Atherosclerosis (ICD10-I70.0). Electronically Signed   By: Mark  Boles M.D.   On: 02/07/2020 13:59  ° °DG Abd 1 View ° °Result Date: 02/08/2020 °CLINICAL DATA:  Follow-up ileus. EXAM: ABDOMEN - 1 VIEW COMPARISON:  Abdomen 02/07/2020.  CT 02/07/2020. FINDINGS: NG tube noted with tip over stomach. Persistent prominently dilated loops of small bowel noted. Colonic dilatation also noted. No free air identified. Degenerative change in scoliosis lumbar spine. Degenerative changes right hip. Total left hip replacement. IMPRESSION: 1.  NG tube noted with tip over stomach. 2. Persistent prominently dilated loops of small bowel again noted. Colonic dilatation also noted. Similar findings noted on prior exams. Findings consistent with persistent ileus.  Continued follow-up exam suggested to demonstrate resolution and to exclude bowel obstruction. Electronically Signed   By: Thomas  Register   On: 02/08/2020 07:35  ° °DG Abd Portable 1V ° °Result Date: 02/07/2020 °CLINICAL DATA:  69-year-old male encounter for NG placement. EXAM: PORTABLE ABDOMEN - 1 VIEW COMPARISON:  Earlier radiograph dated 02/07/2020. FINDINGS: Interval advancement of the enteric tube with side-port and tip in the proximal stomach. Partially visualized central venous line with tip over the cavoatrial junction. IMPRESSION: Enteric tube with tip in the proximal stomach. Electronically Signed   By: Arash  Radparvar M.D.   On: 02/07/2020 17:43  ° °DG Abd Portable 1V ° °Result Date: 02/07/2020 °CLINICAL DATA:  NG tube placement. EXAM: PORTABLE ABDOMEN - 1 VIEW COMPARISON:  CT 02/07/2020. FINDINGS: PowerPort catheter noted tip over cavoatrial junction. NG tube noted with tip at the gastroesophageal junction. Consider advancement of approximately 15 cm. Dilated loops of small bowel again noted. No free air identified. IMPRESSION: 1. NG tube with tip at the gastroesophageal junction. Consider advancement of approximately 15 cm. 2. Dilated loops of small bowel again noted. Electronically Signed   By: Thomas  Register   On: 02/07/2020 16:22  ° ° °Labs: °BMET °Recent Labs  °Lab 02/03/20 °1128 02/04/20 °0539 02/05/20 °0500 02/06/20 °0515 02/07/20 °0342 02/08/20 °0411 02/09/20 °0405  °NA 130* 130* 131* 130* 132* 134* 137  °K 4.0 4.1 3.1* 3.2* 4.0 3.0* 2.4*  °CL 99 99 101 100 102 104 98  °CO2 21* 20* 19* 18* 17* 16* 23  °GLUCOSE 202* 186* 174* 155* 175* 125*   165*  °BUN 43* 50* 55* 58* 69* 83* 99*  °CREATININE 2.55* 3.49* 3.55* 4.15* 4.75* 5.82* 6.82*  °CALCIUM 8.0* 8.4* 7.8* 8.0* 8.2* 8.0* 7.7*  ° °CBC °Recent Labs  °Lab 02/03/20 °1128 02/04/20 °0539 02/06/20 °0515 02/07/20 °0342 02/08/20 °0411 02/09/20 °0405  °WBC 0.2*   < > 0.3* 0.7* 2.4* 4.4  °NEUTROABS 0.0*  --   --  0.3* 1.0* 2.7  °HGB 8.6*   < > 10.0*  10.3* 10.0* 9.1*  °HCT 26.0*   < > 28.3* 30.2* 29.5* 25.9*  °MCV 102.4*   < > 95.3 98.1 98.3 95.2  °PLT 30*   < > 24* 23* 22* 21*  ° < > = values in this interval not displayed.  ° ° °Medications:   ° °• sodium chloride   Intravenous Once  °• atorvastatin  40 mg Oral QPM  °• Chlorhexidine Gluconate Cloth  6 each Topical Daily  °• metoprolol tartrate  5 mg Intravenous Q6H  °• oxybutynin  10 mg Oral QHS  °• pyridOXINE  200 mg Oral Q breakfast  °• saccharomyces boulardii  250 mg Oral BID  °• sodium chloride flush  10-40 mL Intracatheter Q12H  ° ° ° ° ° , MD °02/09/2020, 11:30 AM  ° °

## 2020-02-09 NOTE — Progress Notes (Signed)
PROGRESS NOTE    [name] Rose  QRF:[id number] DOB: 1950-12-21 DOA: 02/03/2020 PCP: Shirline Frees, MD   Chief Complain: Weakness  Brief Narrative: Patient is a 69 year old male with history of metastatic bladder cancer, currently on chemotherapy, paroxysmal A. fib, hypertension, CKD stage IV who presents to the emergency department complaints of generalized weakness and diarrhea for last 4 to 5 days.  He complains of decreased appetite but no abdominal pain, fevers or chills.  He has been falling frequently at home.  When EMS saw him, he was hypotensive.  On presentation he was hypotensive.  Lab work showed severe leukopenia, thrombocytopenia.  Patient was admitted for the management of sepsis secondary to neutropenia and started on cefepime.  Oncology consulted.  Blood cultures  showing Klebsiella pneumonia.  Hospital course remarkable for progressive AKI so nephrology consulted .  Cardiology also following.  02/08/2020: Patient seen.  Available records reviewed.  Input from oncology, cardiology and nephrology team appreciated.  Worsening renal function is noted.  Concerns that patient may need renal replacement therapy.  Ileus is slowly improving.  Patient reported having broken wind.  NG tube to low suction still in situ.  Neutropenia has resolved.  Severe thrombocytopenia persists.   02/09/2020: Patient seen.  Patient reports feeling better.  Worsening hypokalemia (2.4) and renal function.  Nephrology input is appreciated.  Current plan, as per nephrology team, is to hold starting renal replacement therapy for now as there is no acute indication.  Repeat abdominal x-ray continues to reveal colonic dilatation.  Continue to rate the bowel.  Will replete electrolytes.  Assessment & Plan:   Principal Problem:   Failure to thrive in adult Active Problems:   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 (HCC)   Diarrhea   Chronic a-fib (HCC)   Pancytopenia (HCC)   Fall at  home, initial encounter   Atrial fibrillation with RVR (HCC)   Pancytopenia: WBC 0.2, platelets of 30 on presentation.  Pancytopenia secondary to chemotherapy.  Oncology consulted and following.  Continue to monitor CBC.  Transfused with 2 units of PRBC during this hospitalization. He is also being given Granix. He has severe thrombocytopenia and was  transfused with platelets during this hopsitalization. 02/08/2020: WBC today is 2.4, hemoglobin of 10 and platelet count of 22,000.    02/09/2020: WBC is 4.4 today and the platelet count remains 21,000.  Severe sepsis secondary to gram-negative bacteremia (Klebsiella pneumonia):Blood cultures now showed pansensitive  Klebsiella pneumonia.On  Cefazoline,will change it to oral to complete 2 weeks course when patient's overall status improves.  Continue cefazolin for now.  Blood pressure stable.  Continue IV fluids. 02/08/2020: Patient has severe sepsis secondary to Klebsiella pneumonia.  Continue antibiotics.  AKI on CKD stage IV: Baseline creatinine around 2.5.Creatinine bumped up to the range of 4  .  Continue   IV fluids.  He was complaining of severe diarrhea from chemotherapy and it has been going on for several days.  He looked  volume depleted and dehydrated  .  AKI continues to worsen despite being given IV fluids.  Renal ultrasound did not show any abnormalities.  Urine sodium was 22.  Nephrology consulted and following.  He follows with [facility] as an outpatient.   02/08/2020: Nephrology team is directing care.  Renal function continues to worsen.  Nephrology input is appreciated.  No acute indication for renal replacement therapy.  Low potassium noted (2.4).  Nephrology team is managing.  Permanent  A. fib/A. fib with RVR: Was in  A. fib on 02/06/2020.   Cardizem, Toprol at home which have been resumed .  On Eliquis for anticoagulation at home. Eliquis was held due to worsening thrombocytopenia.  We will restart when platelets are more  than 30,000.  Tikosyn also held because of worsening kidney function.  Cardiology has been consulted and following  Diarrhea/Abd distension: Most likely associated with recent chemotherapy.  C. difficile negative.   Hospital course remarkable for severe diarrhea which has stopped but he has developed new abdominal distention.  Abdominal x-ray shows possible ileus.  Will check CT abdomen/pelvis without contrast. 02/08/2020: Patient is currently being managed conservatively for ileus.  No diarrhea.  NG tube to low suction.  Failure to thrive/multiple falls: Decreased appetite, falling frequently at home.    PT consulted.   Continue gentle IV fluids.  Metastatic bladder cancer: Follows with oncology, [name].  On chemotherapy.  He also underwent tumor resection by urology in the past.  Hypertension:   He was hypotensive earlier on presentation,we have resumed his home medications now.BP stable now  Addendum:  CT abdomen/pelvis without contrast showed diffuse dilation of small bowel loops throughout the abdomen to distal ileum.  Questionable obstruction.  Will start on conservative management with NG tube placement, intermittent suction.  If no improvement with conservative therapy, will consider for general surgery evaluation.Lets keep NPO.will check KUB tomorow         DVT prophylaxis:SCD Code Status: Full Family Communication: wife at bedside on 10.27.21 Status is: Inpatient  Remains inpatient appropriate because:Inpatient level of care appropriate due to severity of illness   Dispo: The patient is from: Home              Anticipated d/c is to: Home              Anticipated d/c date is: 3-4 days              Patient currently is not medically stable to d/c.     Consultants:  oncology Nephrology Cardiology  Procedures:None  Antimicrobials:  Anti-infectives (From admission, onward)   Start     Dose/Rate Route Frequency Ordered Stop   02/06/20 1200  ceFAZolin (ANCEF)  IVPB 2g/100 mL premix        2 g 200 mL/hr over 30 Minutes Intravenous Every 12 hours 02/06/20 1103     02/05/20 0600  ceFEPIme (MAXIPIME) 2 g in sodium chloride 0.9 % 100 mL IVPB  Status:  Discontinued        2 g 200 mL/hr over 30 Minutes Intravenous Every 24 hours 02/04/20 0719 02/04/20 1150   02/04/20 1400  cefTRIAXone (ROCEPHIN) 2 g in sodium chloride 0.9 % 100 mL IVPB  Status:  Discontinued        2 g 200 mL/hr over 30 Minutes Intravenous Every 24 hours 02/04/20 1150 02/06/20 1103   02/03/20 1830  ceFEPIme (MAXIPIME) 2 g in sodium chloride 0.9 % 100 mL IVPB  Status:  Discontinued        2 g 200 mL/hr over 30 Minutes Intravenous Every 12 hours 02/03/20 1825 02/04/20 0719      Subjective: -No new complaints. -Patient reports feeling better..  Objective: Vitals:   02/08/20 2109 02/09/20 0548 02/09/20 1159 02/09/20 1227  BP: 122/72 131/84 133/74   Pulse: 95 80 88   Resp: $Remo'19 20 16   'UlFAB$ Temp: 99 F (37.2 C) 97.7 F (36.5 C) (!) 97.5 F (36.4 C)   TempSrc:      SpO2: 92% 93%  97%   Weight:    122.2 kg  Height:        Intake/Output Summary (Last 24 hours) at 02/09/2020 1624 Last data filed at 02/09/2020 1422 Gross per 24 hour  Intake 2480.04 ml  Output 2400 ml  Net 80.04 ml   Filed Weights   02/03/20 1058 02/03/20 1416 02/09/20 1227  Weight: 122.5 kg 121.5 kg 122.2 kg    Examination:   General exam: Weak, acutely ill looking.  Obese Respiratory system: Decreased air entry. Cardiovascular system: Irregularly irregular rhythm.  JVD is difficult to assess.  Neck is supple. Gastrointestinal system: Abdomen is distended, soft and nontender.  Significantly decreased bowel sounds.  Organs are difficult to assess.   Central nervous system: Alert and oriented.  Patient moves all extremities.    Data Reviewed: I have personally reviewed following labs and imaging studies  CBC: Recent Labs  Lab 02/03/20 1128 02/04/20 0539 02/05/20 0500 02/06/20 0515 02/07/20 0342  02/08/20 0411 02/09/20 0405  WBC 0.2*   < > 0.2* 0.3* 0.7* 2.4* 4.4  NEUTROABS 0.0*  --   --   --  0.3* 1.0* 2.7  HGB 8.6*   < > 9.6* 10.0* 10.3* 10.0* 9.1*  HCT 26.0*   < > 27.2* 28.3* 30.2* 29.5* 25.9*  MCV 102.4*   < > 93.8 95.3 98.1 98.3 95.2  PLT 30*   < > 15* 24* 23* 22* 21*   < > = values in this interval not displayed.   Basic Metabolic Panel: Recent Labs  Lab 02/03/20 1128 02/04/20 0539 02/05/20 0500 02/06/20 0515 02/07/20 0342 02/08/20 0411 02/09/20 0405  NA 130*   < > 131* 130* 132* 134* 137  K 4.0   < > 3.1* 3.2* 4.0 3.0* 2.4*  CL 99   < > 101 100 102 104 98  CO2 21*   < > 19* 18* 17* 16* 23  GLUCOSE 202*   < > 174* 155* 175* 125* 165*  BUN 43*   < > 55* 58* 69* 83* 99*  CREATININE 2.55*   < > 3.55* 4.15* 4.75* 5.82* 6.82*  CALCIUM 8.0*   < > 7.8* 8.0* 8.2* 8.0* 7.7*  MG 1.5*  --  1.7  --  2.3  --   --    < > = values in this interval not displayed.   GFR: Estimated Creatinine Clearance: 14.4 mL/min (A) (by C-G formula based on SCr of 6.82 mg/dL (H)). Liver Function Tests: Recent Labs  Lab 02/03/20 1128 02/08/20 0411 02/09/20 0405  AST 25 14* 17  ALT 33 9 <5  ALKPHOS 69 44 45  BILITOT 1.2 0.7 0.7  PROT 5.4* 4.9* 4.6*  ALBUMIN 3.0* 2.3* 2.1*   No results for input(s): LIPASE, AMYLASE in the last 168 hours. No results for input(s): AMMONIA in the last 168 hours. Coagulation Profile: No results for input(s): INR, PROTIME in the last 168 hours. Cardiac Enzymes: No results for input(s): CKTOTAL, CKMB, CKMBINDEX, TROPONINI in the last 168 hours. BNP (last 3 results) No results for input(s): PROBNP in the last 8760 hours. HbA1C: No results for input(s): HGBA1C in the last 72 hours. CBG: Recent Labs  Lab 02/08/20 0738 02/08/20 1125 02/08/20 1632 02/08/20 2106 02/09/20 0716  GLUCAP 117* 116* 135* 122* 171*   Lipid Profile: No results for input(s): CHOL, HDL, LDLCALC, TRIG, CHOLHDL, LDLDIRECT in the last 72 hours. Thyroid Function Tests: No  results for input(s): TSH, T4TOTAL, FREET4, T3FREE, THYROIDAB in the last 72 hours. Anemia  Panel: No results for input(s): VITAMINB12, FOLATE, FERRITIN, TIBC, IRON, RETICCTPCT in the last 72 hours. Sepsis Labs: No results for input(s): PROCALCITON, LATICACIDVEN in the last 168 hours.  Recent Results (from the past 240 hour(s))  Respiratory Panel by RT PCR (Flu A&B, Covid) - Nasopharyngeal Swab     Status: None   Collection Time: 02/03/20 11:28 AM   Specimen: Nasopharyngeal Swab  Result Value Ref Range Status   SARS Coronavirus 2 by RT PCR NEGATIVE NEGATIVE Final    Comment: (NOTE) SARS-CoV-2 target nucleic acids are NOT DETECTED.  The SARS-CoV-2 RNA is generally detectable in upper respiratoy specimens during the acute phase of infection. The lowest concentration of SARS-CoV-2 viral copies this assay can detect is 131 copies/mL. A negative result does not preclude SARS-Cov-2 infection and should not be used as the sole basis for treatment or other patient management decisions. A negative result may occur with  improper specimen collection/handling, submission of specimen other than nasopharyngeal swab, presence of viral mutation(s) within the areas targeted by this assay, and inadequate number of viral copies (<131 copies/mL). A negative result must be combined with clinical observations, patient history, and epidemiological information. The expected result is Negative.  Fact Sheet for Patients:  PinkCheek.be  Fact Sheet for Healthcare Providers:  GravelBags.it  This test is no t yet approved or cleared by the [place] FDA and  has been authorized for detection and/or diagnosis of SARS-CoV-2 by FDA under an Emergency Use Authorization (EUA). This EUA will remain  in effect (meaning this test can be used) for the duration of the COVID-19 declaration under Section 564(b)(1) of the Act, 21 U.S.C. section 360bbb-3(b)(1),  unless the authorization is terminated or revoked sooner.     Influenza A by PCR NEGATIVE NEGATIVE Final   Influenza B by PCR NEGATIVE NEGATIVE Final    Comment: (NOTE) The Xpert Xpress SARS-CoV-2/FLU/RSV assay is intended as an aid in  the diagnosis of influenza from Nasopharyngeal swab specimens and  should not be used as a sole basis for treatment. Nasal washings and  aspirates are unacceptable for Xpert Xpress SARS-CoV-2/FLU/RSV  testing.  Fact Sheet for Patients: PinkCheek.be  Fact Sheet for Healthcare Providers: GravelBags.it  This test is not yet approved or cleared by the [place] FDA and  has been authorized for detection and/or diagnosis of SARS-CoV-2 by  FDA under an Emergency Use Authorization (EUA). This EUA will remain  in effect (meaning this test can be used) for the duration of the  Covid-19 declaration under Section 564(b)(1) of the Act, 21  U.S.C. section 360bbb-3(b)(1), unless the authorization is  terminated or revoked. Performed at [facility], [place] [address] [place], [place] [address]   Culture, blood (x 2)     Status: Abnormal   Collection Time: 02/03/20  6:50 PM   Specimen: BLOOD RIGHT HAND  Result Value Ref Range Status   Specimen Description   Final    BLOOD RIGHT HAND Performed at [facility] [address] [place], [place] [address]    Special Requests   Final    BOTTLES DRAWN AEROBIC ONLY Blood Culture results may not be optimal due to an inadequate volume of blood received in culture bottles Performed at [facility] [address] [place], [place] [address]    Culture  Setup Time   Final    GRAM NEGATIVE RODS AEROBIC BOTTLE ONLY CRITICAL VALUE NOTED.  VALUE IS CONSISTENT WITH PREVIOUSLY REPORTED AND CALLED VALUE.  Culture (A)  Final    KLEBSIELLA PNEUMONIAE SUSCEPTIBILITIES PERFORMED ON PREVIOUS CULTURE WITHIN  THE LAST 5 DAYS. Performed at [facility], [place] [address] [place], [place] [address]    Report Status 02/06/2020 FINAL  Final  Culture, blood (x 2)     Status: Abnormal   Collection Time: 02/03/20  6:50 PM   Specimen: BLOOD  Result Value Ref Range Status   Specimen Description   Final    BLOOD RIGHT ANTECUBITAL Performed at [facility], [place] [address], [place], [place] [address]    Special Requests   Final    BOTTLES DRAWN AEROBIC ONLY Blood Culture results may not be optimal due to an excessive volume of blood received in culture bottles Performed at [facility] [address], [place], [place] [address]    Culture  Setup Time   Final    GRAM NEGATIVE RODS AEROBIC BOTTLE ONLY CRITICAL RESULT CALLED TO, READ BACK BY AND VERIFIED WITH: T,GREEN PHARMD $RemoveBefore'@1113'[id number]$  02/04/20 EB Performed at [facility] [address] [place], [place] [address]    Culture KLEBSIELLA PNEUMONIAE (A)  Final   Report Status 02/06/2020 FINAL  Final   Organism ID, Bacteria KLEBSIELLA PNEUMONIAE  Final      Susceptibility   Klebsiella pneumoniae - MIC*    AMPICILLIN >=32 RESISTANT Resistant     CEFAZOLIN <=4 SENSITIVE Sensitive     CEFEPIME <=0.12 SENSITIVE Sensitive     CEFTAZIDIME <=1 SENSITIVE Sensitive     CEFTRIAXONE <=0.25 SENSITIVE Sensitive     CIPROFLOXACIN <=0.25 SENSITIVE Sensitive     GENTAMICIN <=1 SENSITIVE Sensitive     IMIPENEM <=0.25 SENSITIVE Sensitive     TRIMETH/SULFA <=20 SENSITIVE Sensitive     AMPICILLIN/SULBACTAM 4 SENSITIVE Sensitive     PIP/TAZO <=4 SENSITIVE Sensitive     * KLEBSIELLA PNEUMONIAE  Blood Culture ID Panel (Reflexed)     Status: Abnormal   Collection Time: 02/03/20  6:50 PM  Result Value Ref Range Status   Enterococcus faecalis NOT DETECTED NOT DETECTED Final   Enterococcus Faecium NOT DETECTED NOT DETECTED Final   Listeria monocytogenes NOT DETECTED NOT DETECTED Final   Staphylococcus species NOT DETECTED NOT DETECTED  Final   Staphylococcus aureus (BCID) NOT DETECTED NOT DETECTED Final   Staphylococcus epidermidis NOT DETECTED NOT DETECTED Final   Staphylococcus lugdunensis NOT DETECTED NOT DETECTED Final   Streptococcus species NOT DETECTED NOT DETECTED Final   Streptococcus agalactiae NOT DETECTED NOT DETECTED Final   Streptococcus pneumoniae NOT DETECTED NOT DETECTED Final   Streptococcus pyogenes NOT DETECTED NOT DETECTED Final   A.calcoaceticus-baumannii NOT DETECTED NOT DETECTED Final   Bacteroides fragilis NOT DETECTED NOT DETECTED Final   Enterobacterales DETECTED (A) NOT DETECTED Final    Comment: Enterobacterales represent a large order of gram negative bacteria, not a single organism. CRITICAL RESULT CALLED TO, READ BACK BY AND VERIFIED WITH: T,GREEN PHARMD $RemoveBefore'@1113'[id number]$  02/04/20 EB    Enterobacter cloacae complex NOT DETECTED NOT DETECTED Final   Escherichia coli NOT DETECTED NOT DETECTED Final   Klebsiella aerogenes NOT DETECTED NOT DETECTED Final   Klebsiella oxytoca NOT DETECTED NOT DETECTED Final   Klebsiella pneumoniae DETECTED (A) NOT DETECTED Final    Comment: CRITICAL RESULT CALLED TO, READ BACK BY AND VERIFIED WITH: T,GREEN PHARMD $RemoveBefore'@1113'[email or web address]$  02/04/20 EB    Proteus species NOT DETECTED NOT DETECTED Final   Salmonella species NOT DETECTED NOT DETECTED Final   Serratia marcescens NOT DETECTED NOT DETECTED Final  Haemophilus influenzae NOT DETECTED NOT DETECTED Final   Neisseria meningitidis NOT DETECTED NOT DETECTED Final   Pseudomonas aeruginosa NOT DETECTED NOT DETECTED Final   Stenotrophomonas maltophilia NOT DETECTED NOT DETECTED Final   Candida albicans NOT DETECTED NOT DETECTED Final   Candida auris NOT DETECTED NOT DETECTED Final   Candida glabrata NOT DETECTED NOT DETECTED Final   Candida krusei NOT DETECTED NOT DETECTED Final   Candida parapsilosis NOT DETECTED NOT DETECTED Final   Candida tropicalis NOT DETECTED NOT DETECTED Final   Cryptococcus neoformans/gattii NOT DETECTED  NOT DETECTED Final   CTX-M ESBL NOT DETECTED NOT DETECTED Final   Carbapenem resistance IMP NOT DETECTED NOT DETECTED Final   Carbapenem resistance KPC NOT DETECTED NOT DETECTED Final   Carbapenem resistance NDM NOT DETECTED NOT DETECTED Final   Carbapenem resist OXA 48 LIKE NOT DETECTED NOT DETECTED Final   Carbapenem resistance VIM NOT DETECTED NOT DETECTED Final    Comment: Performed at [facility], [address], [place], [place] [address]  C Difficile Quick Screen w PCR reflex     Status: None   Collection Time: 02/04/20  5:07 PM   Specimen: STOOL  Result Value Ref Range Status   C Diff antigen NEGATIVE NEGATIVE Final   C Diff toxin NEGATIVE NEGATIVE Final   C Diff interpretation No C. difficile detected.  Final    Comment: Performed at [facility], [place] [address] [place], [place] [address]         Radiology Studies: DG Abd 1 View  Result Date: 02/09/2020 CLINICAL DATA:  Ileus. EXAM: ABDOMEN - 1 VIEW COMPARISON:  February 08, 2020 FINDINGS: Persistent dilation of small bowel loops throughout the abdomen measuring up to 5 cm in diameter, with associated mild small bowel wall thickening. The colon is decompressed with residual foci of gas. IMPRESSION: Persistent small bowel dilation throughout the abdomen, with associated mild small bowel wall thickening, suggestive of ongoing small bowel obstruction versus ileus. Supine positioning precludes proper evaluation for free intra-abdominal gas. Electronically Signed   By: Fidela Salisbury M.D.   On: 02/09/2020 13:23   DG Abd 1 View  Result Date: 02/08/2020 CLINICAL DATA:  Follow-up ileus. EXAM: ABDOMEN - 1 VIEW COMPARISON:  Abdomen 02/07/2020.  CT 02/07/2020. FINDINGS: NG tube noted with tip over stomach. Persistent prominently dilated loops of small bowel noted. Colonic dilatation also noted. No free air identified. Degenerative change in scoliosis lumbar spine. Degenerative changes right hip. Total left  hip replacement. IMPRESSION: 1.  NG tube noted with tip over stomach. 2. Persistent prominently dilated loops of small bowel again noted. Colonic dilatation also noted. Similar findings noted on prior exams. Findings consistent with persistent ileus. Continued follow-up exam suggested to demonstrate resolution and to exclude bowel obstruction. Electronically Signed   By: Marcello Moores  Register   On: 02/08/2020 07:35   DG Abd Portable 1V  Result Date: 02/07/2020 CLINICAL DATA:  69 year old male encounter for NG placement. EXAM: PORTABLE ABDOMEN - 1 VIEW COMPARISON:  Earlier radiograph dated 02/07/2020. FINDINGS: Interval advancement of the enteric tube with side-port and tip in the proximal stomach. Partially visualized central venous line with tip over the cavoatrial junction. IMPRESSION: Enteric tube with tip in the proximal stomach. Electronically Signed   By: Anner Crete M.D.   On: 02/07/2020 17:43        Scheduled Meds: . sodium chloride   Intravenous Once  . atorvastatin  40 mg Oral QPM  . Chlorhexidine Gluconate Cloth  6  each Topical Daily  . metoprolol tartrate  5 mg Intravenous Q6H  . oxybutynin  10 mg Oral QHS  . pyridOXINE  200 mg Oral Q breakfast  . saccharomyces boulardii  250 mg Oral BID  . sodium chloride flush  10-40 mL Intracatheter Q12H   Continuous Infusions: . sodium chloride 150 mL/hr at 02/09/20 1157  .  ceFAZolin (ANCEF) IV 2 g (02/09/20 1159)  . potassium chloride 10 mEq (02/09/20 1532)     LOS: 6 days    Time spent: 35 mins.   Bonnell Public, MD [facility] P10/30/2021, 4:24 PM

## 2020-02-09 NOTE — Progress Notes (Signed)
Progress Note  Patient Name: [name] Rose Date of Encounter: 02/09/2020  [facility] Cardiologist: Pixie Casino, MD   Subjective   Denies any chest pain or SOB. NGT remains in place but feels better.  HR much improved on IV lopressor while NPO  Inpatient Medications    Scheduled Meds: . sodium chloride   Intravenous Once  . atorvastatin  40 mg Oral QPM  . Chlorhexidine Gluconate Cloth  6 each Topical Daily  . metoprolol tartrate  5 mg Intravenous Q6H  . oxybutynin  10 mg Oral QHS  . pyridOXINE  200 mg Oral Q breakfast  . saccharomyces boulardii  250 mg Oral BID  . sodium chloride flush  10-40 mL Intracatheter Q12H   Continuous Infusions: .  ceFAZolin (ANCEF) IV Stopped (02/08/20 2145)  . potassium chloride 10 mEq (02/09/20 0758)  . sodium bicarbonate (isotonic) 150 mEq in D5W 1000 mL infusion 125 mL/hr at 02/09/20 0600   PRN Meds: acetaminophen **OR** [DISCONTINUED] acetaminophen, chlorproMAZINE, melatonin, ondansetron **OR** ondansetron (ZOFRAN) IV, sodium chloride flush, traMADol   Vital Signs    Vitals:   02/08/20 0539 02/08/20 1212 02/08/20 2109 02/09/20 0548  BP: 138/79 124/75 122/72 131/84  Pulse: 98 (!) 101 95 80  Resp: $Remo'20 19 19 20  'hdQda$ Temp: 97.8 F (36.6 C) 97.6 F (36.4 C) 99 F (37.2 C) 97.7 F (36.5 C)  TempSrc: Oral     SpO2: 96% 94% 92% 93%  Weight:      Height:        Intake/Output Summary (Last 24 hours) at 02/09/2020 0842 Last data filed at 02/09/2020 0600 Gross per 24 hour  Intake 2480.04 ml  Output 1550 ml  Net 930.04 ml   Last 3 Weights 02/03/2020 02/03/2020 01/28/2020  Weight (lbs) 267 lb 12.8 oz 270 lb 270 lb 9.6 oz  Weight (kg) 121.473 kg 122.471 kg 122.743 kg      Telemetry    Atrial fibrillation with CVR and PVCs - Personally Reviewed  ECG    No new EKG to review - Personally Reviewed  Physical Exam   GEN: Well nourished, well developed in no acute distress HEENT: Normal NECK: No JVD; No carotid  bruits LYMPHATICS: No lymphadenopathy CARDIAC:irreguilarly irregular, no murmurs, rubs, gallops RESPIRATORY:  Clear to auscultation without rales, wheezing or rhonchi  ABDOMEN: Soft, non-tender, non-distended MUSCULOSKELETAL:  No edema; No deformity  SKIN: Warm and dry NEUROLOGIC:  Alert and oriented x 3 PSYCHIATRIC:  Normal affect    Labs    High Sensitivity Troponin:  No results for input(s): TROPONINIHS in the last 720 hours.    Chemistry Recent Labs  Lab 02/03/20 1128 02/04/20 0539 02/07/20 0342 02/08/20 0411 02/09/20 0405  NA 130*   < > 132* 134* 137  K 4.0   < > 4.0 3.0* 2.4*  CL 99   < > 102 104 98  CO2 21*   < > 17* 16* 23  GLUCOSE 202*   < > 175* 125* 165*  BUN 43*   < > 69* 83* 99*  CREATININE 2.55*   < > 4.75* 5.82* 6.82*  CALCIUM 8.0*   < > 8.2* 8.0* 7.7*  PROT 5.4*  --   --  4.9* 4.6*  ALBUMIN 3.0*  --   --  2.3* 2.1*  AST 25  --   --  14* 17  ALT 33  --   --  9 <5  ALKPHOS 69  --   --  44 45  BILITOT  1.2  --   --  0.7 0.7  GFRNONAA 27*   < > 13* 10* 8*  ANIONGAP 10   < > 13 14 16*   < > = values in this interval not displayed.     Hematology Recent Labs  Lab 02/07/20 0342 02/08/20 0411 02/09/20 0405  WBC 0.7* 2.4* 4.4  RBC 3.08* 3.00* 2.72*  HGB 10.3* 10.0* 9.1*  HCT 30.2* 29.5* 25.9*  MCV 98.1 98.3 95.2  MCH 33.4 33.3 33.5  MCHC 34.1 33.9 35.1  RDW 14.6 14.7 14.9  PLT 23* 22* 21*    BNPNo results for input(s): BNP, PROBNP in the last 168 hours.   DDimer No results for input(s): DDIMER in the last 168 hours.   Radiology    CT ABDOMEN PELVIS WO CONTRAST  Result Date: 02/07/2020 CLINICAL DATA:  Generalized weakness, diarrhea, decreased appetite, hypotensive, leukopenia, thrombocytopenia, history metastatic bladder cancer, paroxysmal atrial fibrillation, hypertension, stage IV chronic kidney disease, question bowel obstruction EXAM: CT ABDOMEN AND PELVIS WITHOUT CONTRAST TECHNIQUE: Multidetector CT imaging of the abdomen and pelvis was  performed following the standard protocol without IV contrast. Sagittal and coronal MPR images reconstructed from axial data set. Water as oral contrast. COMPARISON:  01/05/2018 FINDINGS: Lower chest: RIGHT lower lobe bronchiectasis. Bibasilar atelectasis. Small LEFT pleural effusion. Hepatobiliary: Low-attenuation foci within liver question cysts unchanged, largest 15 x 14 mm lateral segment LEFT lobe image 19. Gallbladder unremarkable. Pancreas: Atrophic pancreas without mass Spleen: Normal appearance Adrenals/Urinary Tract: Adrenal glands, kidneys, and ureters normal appearance. Bladder wall appears thickened but bladder is underdistended, question artifact. Stomach/Bowel: Minimal sigmoid diverticulosis without evidence of diverticulitis. Diffuse dilatation of small bowel loops throughout abdomen. Stomach distended by fluid. Fluid within distended thus distal esophagus question reflux. Small bowel loops are but question of bowel wall thickening of the distal ileum is raised. Mild gaseous distension of ascending and transverse colon. Descending and sigmoid colon decompressed without discrete transition zone. Vascular/Lymphatic: Atherosclerotic calcifications aorta and iliac arteries without aneurysm. Coronary arterial calcifications. No adenopathy. Reproductive: Prior prostatectomy. Other: No free air. Small amount of fluid at RIGHT gutter. Minimal stranding of mesentery. Musculoskeletal: Degenerative disc disease changes thoracolumbar spine. IMPRESSION: Diffuse dilatation of small bowel loops throughout abdomen to distal ileum question obstruction. Suspected bowel wall thickening of distal ileum, uncertain etiology but could represent inflammatory bowel disease or infection, tumor considered less likely due to length of involvement; suspect this is responsible for the observed component of small-bowel obstruction. Small amount of nonspecific free fluid. Minimal sigmoid diverticulosis without evidence of  diverticulitis. RIGHT lower lobe bronchiectasis with bibasilar atelectasis and small LEFT pleural effusion. Aortic Atherosclerosis (ICD10-I70.0). Electronically Signed   By: Lavonia Dana M.D.   On: 02/07/2020 13:59   DG Abd 1 View  Result Date: 02/08/2020 CLINICAL DATA:  Follow-up ileus. EXAM: ABDOMEN - 1 VIEW COMPARISON:  Abdomen 02/07/2020.  CT 02/07/2020. FINDINGS: NG tube noted with tip over stomach. Persistent prominently dilated loops of small bowel noted. Colonic dilatation also noted. No free air identified. Degenerative change in scoliosis lumbar spine. Degenerative changes right hip. Total left hip replacement. IMPRESSION: 1.  NG tube noted with tip over stomach. 2. Persistent prominently dilated loops of small bowel again noted. Colonic dilatation also noted. Similar findings noted on prior exams. Findings consistent with persistent ileus. Continued follow-up exam suggested to demonstrate resolution and to exclude bowel obstruction. Electronically Signed   By: Marcello Moores  Register   On: 02/08/2020 07:35   DG Abd Portable 1V  Result  Date: 02/07/2020 CLINICAL DATA:  69 year old male encounter for NG placement. EXAM: PORTABLE ABDOMEN - 1 VIEW COMPARISON:  Earlier radiograph dated 02/07/2020. FINDINGS: Interval advancement of the enteric tube with side-port and tip in the proximal stomach. Partially visualized central venous line with tip over the cavoatrial junction. IMPRESSION: Enteric tube with tip in the proximal stomach. Electronically Signed   By: Anner Crete M.D.   On: 02/07/2020 17:43   DG Abd Portable 1V  Result Date: 02/07/2020 CLINICAL DATA:  NG tube placement. EXAM: PORTABLE ABDOMEN - 1 VIEW COMPARISON:  CT 02/07/2020. FINDINGS: PowerPort catheter noted tip over cavoatrial junction. NG tube noted with tip at the gastroesophageal junction. Consider advancement of approximately 15 cm. Dilated loops of small bowel again noted. No free air identified. IMPRESSION: 1. NG tube with tip at  the gastroesophageal junction. Consider advancement of approximately 15 cm. 2. Dilated loops of small bowel again noted. Electronically Signed   By: Marcello Moores  Register   On: 02/07/2020 16:22    Cardiac Studies   Echo 10/05/2019 1. Normal LV function; mild LVE; trace MR and TR.  2. Left ventricular ejection fraction, by estimation, is 55 to 60%. The  left ventricle has normal function. The left ventricle has no regional  wall motion abnormalities. The left ventricular internal cavity size was  mildly dilated. Left ventricular  diastolic parameters were normal.  3. Right ventricular systolic function is normal. The right ventricular  size is normal. There is mildly elevated pulmonary artery systolic  pressure.  4. The mitral valve is normal in structure. Trivial mitral valve  regurgitation. No evidence of mitral stenosis.  5. The aortic valve has an indeterminant number of cusps. Aortic valve  regurgitation is not visualized. No aortic stenosis is present.  6. The inferior vena cava is normal in size with greater than 50%  respiratory variability, suggesting right atrial pressure of 3 mmHg.   Stress test 06/2019  There was no ST segment deviation noted during stress.  Findings consistent with prior myocardial infarction with peri-infarct ischemia.  This is an intermediate risk study.  There is a medium size perfusion defect. There is a mild fixed perfusion defect in the mid anterior wall. There is a moderate perfusoin defect in the anteroapex, and mild defect in the apical cap, both with partial reversibility. These findings suggest prior infarct, with peri-infarct ischemia.   Volumetric assessment, EF and wall motion could not be assessed in the absence of ECG gating with atrial fibrillation.    Patient Profile     69 y.o. male with a hx of persistent atrial fibrillation, hypertension hyperlipidemia, mitral regurgitations, PVC, metastatic bladder cancer to intra-abdominal  lymph node on chemotherapy, chronic kidney disease IV and chronic anemia who is being seen today for the evaluation of atrial fibrillation with RVR at the request of Dr. Tawanna Solo.   Assessment & Plan    1. Persistent atrial fibrillation -Patient was in sinus rhythm when seen in atrial fibrillation clinic 10/18.  -Unfortunately no EKG on admit and his Tikosyn was held due to worsening renal function.  -His rate control agent also held on admit secondary to hypovolemia and hypotension. -now NPO with NGT in due to ileus vs. SBO -HR better controlled today on IV Lopressor>>continue until taking PO  CHA2DS2-VASc Score = 2  This indicates a 2.2% annual risk of stroke. The patient's score is based upon: CHF History: 0 HTN History: 1 Diabetes History: 0 Stroke History: 0 Vascular Disease History: 0 Age Score: 1 Gender  Score: 0   -His anticoagulation has been held secondary to thrombocytopenia plan to restart when platelets above 30,000 (Plt ct 21K today). -he is markedly hypokalemic today likely from GI losses -Keep K > 4 and Mg > 2.0 per TRH -cannot restart Tikosyn since he has been off anticoagulation for more than 48 hours and therefore at risk for LA thrombus as well as contraindicated in AKI  2.  Thrombocytopenia -related to chemo -S/p transfusion with platelets -Plan to restart Eliquis when platelets are greater than 30K  3. Acute on CKD IV - renal function continues to trend upward to 5.82>>6.82 today (Baseline Scr 1.9-2.6) - followed by nephrology and likely heading towards HD  4. Metastatic bladder cancer - Per oncology     For questions or updates, please contact [place] Please consult [email or web address] for contact info under        Signed, Fransico Him, MD  02/09/2020, 8:42 AM

## 2020-02-10 ENCOUNTER — Inpatient Hospital Stay (HOSPITAL_COMMUNITY): Payer: PPO

## 2020-02-10 DIAGNOSIS — E876 Hypokalemia: Secondary | ICD-10-CM | POA: Diagnosis not present

## 2020-02-10 DIAGNOSIS — D696 Thrombocytopenia, unspecified: Secondary | ICD-10-CM | POA: Diagnosis not present

## 2020-02-10 DIAGNOSIS — R140 Abdominal distension (gaseous): Secondary | ICD-10-CM

## 2020-02-10 DIAGNOSIS — I4819 Other persistent atrial fibrillation: Secondary | ICD-10-CM | POA: Diagnosis not present

## 2020-02-10 LAB — RENAL FUNCTION PANEL
Albumin: 2.3 g/dL — ABNORMAL LOW (ref 3.5–5.0)
Anion gap: 21 — ABNORMAL HIGH (ref 5–15)
BUN: 85 mg/dL — ABNORMAL HIGH (ref 8–23)
CO2: 26 mmol/L (ref 22–32)
Calcium: 8.1 mg/dL — ABNORMAL LOW (ref 8.9–10.3)
Chloride: 98 mmol/L (ref 98–111)
Creatinine, Ser: 8.13 mg/dL — ABNORMAL HIGH (ref 0.61–1.24)
GFR, Estimated: 7 mL/min — ABNORMAL LOW (ref >60)
Glucose, Bld: 113 mg/dL — ABNORMAL HIGH (ref 70–99)
Phosphorus: 5 mg/dL — ABNORMAL HIGH (ref 2.5–4.6)
Potassium: 2.8 mmol/L — ABNORMAL LOW (ref 3.5–5.1)
Sodium: 145 mmol/L (ref 135–145)

## 2020-02-10 LAB — CBC WITH DIFFERENTIAL/PLATELET
Abs Immature Granulocytes: 0.22 10*3/uL — ABNORMAL HIGH (ref 0.00–0.07)
Abs Immature Granulocytes: 0.59 10*3/uL — ABNORMAL HIGH (ref 0.00–0.07)
Basophils Absolute: 0 10*3/uL (ref 0.0–0.1)
Basophils Absolute: 0 10*3/uL (ref 0.0–0.1)
Basophils Relative: 1 %
Basophils Relative: 0 %
Eosinophils Absolute: 0.5 10*3/uL (ref 0.0–0.5)
Eosinophils Absolute: 0.1 10*3/uL (ref 0.0–0.5)
Eosinophils Relative: 6 %
Eosinophils Relative: 1 %
HCT: 27 % — ABNORMAL LOW (ref 39.0–52.0)
HCT: 27.2 % — ABNORMAL LOW (ref 39.0–52.0)
Hemoglobin: 9.5 g/dL — ABNORMAL LOW (ref 13.0–17.0)
Hemoglobin: 9.2 g/dL — ABNORMAL LOW (ref 13.0–17.0)
Immature Granulocytes: 3 %
Immature Granulocytes: 6 %
Lymphocytes Relative: 11 %
Lymphocytes Relative: 9 %
Lymphs Abs: 0.9 10*3/uL (ref 0.7–4.0)
Lymphs Abs: 0.9 10*3/uL (ref 0.7–4.0)
MCH: 33.8 pg (ref 26.0–34.0)
MCH: 32.9 pg (ref 26.0–34.0)
MCHC: 35.2 g/dL (ref 30.0–36.0)
MCHC: 33.8 g/dL (ref 30.0–36.0)
MCV: 96.1 fL (ref 80.0–100.0)
MCV: 97.1 fL (ref 80.0–100.0)
Monocytes Absolute: 1.2 10*3/uL — ABNORMAL HIGH (ref 0.1–1.0)
Monocytes Absolute: 0.8 10*3/uL (ref 0.1–1.0)
Monocytes Relative: 14 %
Monocytes Relative: 8 %
Neutro Abs: 5.5 10*3/uL (ref 1.7–7.7)
Neutro Abs: 7.3 10*3/uL (ref 1.7–7.7)
Neutrophils Relative %: 65 %
Neutrophils Relative %: 76 %
Platelets: 25 10*3/uL — CL (ref 150–400)
Platelets: 34 10*3/uL — ABNORMAL LOW (ref 150–400)
RBC: 2.81 MIL/uL — ABNORMAL LOW (ref 4.22–5.81)
RBC: 2.8 MIL/uL — ABNORMAL LOW (ref 4.22–5.81)
RDW: 14.9 % (ref 11.5–15.5)
RDW: 15 % (ref 11.5–15.5)
WBC: 8.4 10*3/uL (ref 4.0–10.5)
WBC: 9.6 10*3/uL (ref 4.0–10.5)
nRBC: 0.2 % (ref 0.0–0.2)
nRBC: 0.2 % (ref 0.0–0.2)

## 2020-02-10 LAB — COMPREHENSIVE METABOLIC PANEL
ALT: 5 U/L (ref 0–44)
AST: 20 U/L (ref 15–41)
Albumin: 2.1 g/dL — ABNORMAL LOW (ref 3.5–5.0)
Alkaline Phosphatase: 44 U/L (ref 38–126)
Anion gap: 18 — ABNORMAL HIGH (ref 5–15)
BUN: 99 mg/dL — ABNORMAL HIGH (ref 8–23)
CO2: 25 mmol/L (ref 22–32)
Calcium: 7.4 mg/dL — ABNORMAL LOW (ref 8.9–10.3)
Chloride: 97 mmol/L — ABNORMAL LOW (ref 98–111)
Creatinine, Ser: 8.26 mg/dL — ABNORMAL HIGH (ref 0.61–1.24)
GFR, Estimated: 7 mL/min — ABNORMAL LOW (ref >60)
Glucose, Bld: 115 mg/dL — ABNORMAL HIGH (ref 70–99)
Potassium: 2.8 mmol/L — ABNORMAL LOW (ref 3.5–5.1)
Sodium: 140 mmol/L (ref 135–145)
Total Bilirubin: 0.7 mg/dL (ref 0.3–1.2)
Total Protein: 4.5 g/dL — ABNORMAL LOW (ref 6.5–8.1)

## 2020-02-10 LAB — OCCULT BLOOD X 1 CARD TO LAB, STOOL: Fecal Occult Bld: POSITIVE — AB

## 2020-02-10 LAB — MAGNESIUM: Magnesium: 2.3 mg/dL (ref 1.7–2.4)

## 2020-02-10 MED ORDER — SODIUM CHLORIDE 0.9% IV SOLUTION: Freq: Once | INTRAVENOUS | Status: AC

## 2020-02-10 MED ORDER — POTASSIUM CHLORIDE 10 MEQ/100ML IV SOLN
10.0000 meq | INTRAVENOUS | Status: AC
  Administered 2020-02-10 (×2): 10 meq via INTRAVENOUS
  Filled 2020-02-10 (×2): qty 100

## 2020-02-10 MED ORDER — SODIUM CHLORIDE 0.9 % IV SOLN
80.0000 mg | Freq: Once | INTRAVENOUS | Status: AC
  Administered 2020-02-10: 80 mg via INTRAVENOUS
  Filled 2020-02-10: qty 80

## 2020-02-10 MED ORDER — POTASSIUM CHLORIDE 10 MEQ/100ML IV SOLN
10.0000 meq | INTRAVENOUS | Status: DC
  Filled 2020-02-10: qty 100

## 2020-02-10 MED ORDER — PANTOPRAZOLE SODIUM 40 MG IV SOLR
40.0000 mg | Freq: Two times a day (BID) | INTRAVENOUS | Status: DC
Start: 2020-02-14 — End: 2020-02-10

## 2020-02-10 MED ORDER — POTASSIUM CHLORIDE 10 MEQ/100ML IV SOLN
10.0000 meq | INTRAVENOUS | Status: AC
  Administered 2020-02-10 (×5): 10 meq via INTRAVENOUS
  Filled 2020-02-10 (×4): qty 100

## 2020-02-10 MED ORDER — DEXAMETHASONE SODIUM PHOSPHATE 4 MG/ML IJ SOLN
4.0000 mg | Freq: Once | INTRAMUSCULAR | Status: AC
  Administered 2020-02-10: 4 mg via INTRAVENOUS
  Filled 2020-02-10: qty 1

## 2020-02-10 MED ORDER — SODIUM CHLORIDE 0.9 % IV SOLN
8.0000 mg/h | INTRAVENOUS | Status: DC
  Administered 2020-02-10 – 2020-02-11 (×3): 8 mg/h via INTRAVENOUS
  Filled 2020-02-10 (×7): qty 80

## 2020-02-10 NOTE — Progress Notes (Signed)
Occupational Therapy Treatment Patient Details Name: [name] Rose MRN: [id number] DOB: [birth year] Today's Date: 02/10/2020    History of present illness 69 yo male admitted with FTT, pancytopenia, thrombocytopenia, sepsis, weakness. hx of met prostate ca with bladder mets, Afib, CKD   OT comments  Pt had accident in bed and blood noted in BM.   RN notified  Follow Up Recommendations  Home health OT    Equipment Recommendations  None recommended by OT    Recommendations for Other Services      Precautions / Restrictions Precautions Precautions: Fall Precaution Comments: NPO Restrictions Weight Bearing Restrictions: No       Mobility Bed Mobility Overal bed mobility: Needs Assistance Bed Mobility: Supine to Sit     Supine to sit: Mod assist        Transfers Overall transfer level: Needs assistance Equipment used: Rolling walker (2 wheeled) Transfers: Sit to/from [facility] Sit to Stand: Min assist Stand pivot transfers: Min assist       General transfer comment: from elevated bed    Balance Overall balance assessment: No apparent balance deficits (not formally assessed)                                         ADL either performed or assessed with clinical judgement   ADL Overall ADL's : Needs assistance/impaired     Grooming: Wash/dry face;Standing;Min guard                   Toilet Transfer: Min guard;RW;Stand-pivot;BSC;Minimal assistance   Toileting- Clothing Manipulation and Hygiene: Maximal assistance;Sit to/from stand         General ADL Comments: pt had accident in bed. RN called.  Blood noticed in stool     Vision Patient Visual Report: No change from baseline            Cognition Arousal/Alertness: Awake/alert Behavior During Therapy: WFL for tasks assessed/performed Overall Cognitive Status: Within Functional Limits for tasks assessed                                  General Comments: AxO x 3 very pleasant                   Pertinent Vitals/ Pain       Pain Score: 2  Pain Location: general Pain Descriptors / Indicators: Sore Pain Intervention(s): Limited activity within patient's tolerance;Repositioned         Frequency  Min 2X/week        Progress Toward Goals  OT Goals(current goals can now be found in the care plan section)  Progress towards OT goals: Progressing toward goals     Plan Discharge plan remains appropriate       AM-PAC OT "6 Clicks" Daily Activity     Outcome Measure   Help from another person eating meals?: A Little Help from another person taking care of personal grooming?: A Little Help from another person toileting, which includes using toliet, bedpan, or urinal?: A Little Help from another person bathing (including washing, rinsing, drying)?: A Lot Help from another person to put on and taking off regular upper body clothing?: A Little Help from another person to put on and taking off regular lower body clothing?: A Lot 6 Click Score: 16    End  of Session Equipment Utilized During Treatment: Rolling walker  OT Visit Diagnosis: Muscle weakness (generalized) (M62.81)   Activity Tolerance Patient tolerated treatment well   Patient Left in chair;with call bell/phone within reach;with family/visitor present   Nurse Communication  (okay to see per RN)        Time: 1050-1104 OT Time Calculation (min): 14 min  Charges: OT General Charges $OT Visit: 1 Visit OT Treatments $Self Care/Home Management : 8-22 mins  Kari Baars, [place] [phone] Office- [phone]      [place], Edwena Felty D 02/10/2020, 11:54 AM

## 2020-02-10 NOTE — Progress Notes (Addendum)
Progress Note  Patient Name: [name] Rose Date of Encounter: 02/10/2020  [facility] Cardiologist: Pixie Casino, MD   Subjective   NGT remains in place but feels better.  HR remains controlled. Denies any chest pain or SOB  Inpatient Medications    Scheduled Meds: . sodium chloride   Intravenous Once  . atorvastatin  40 mg Oral QPM  . Chlorhexidine Gluconate Cloth  6 each Topical Daily  . metoprolol tartrate  5 mg Intravenous Q6H  . oxybutynin  10 mg Oral QHS  . pyridOXINE  200 mg Oral Q breakfast  . saccharomyces boulardii  250 mg Oral BID  . sodium chloride flush  10-40 mL Intracatheter Q12H   Continuous Infusions: . sodium chloride 150 mL/hr at 02/09/20 1157  .  ceFAZolin (ANCEF) IV 2 g (02/09/20 2142)  . potassium chloride 10 mEq (02/10/20 0805)   PRN Meds: acetaminophen **OR** [DISCONTINUED] acetaminophen, chlorproMAZINE, melatonin, ondansetron **OR** ondansetron (ZOFRAN) IV, sodium chloride flush   Vital Signs    Vitals:   02/09/20 1159 02/09/20 1227 02/09/20 2014 02/10/20 0423  BP: 133/74  120/71 118/61  Pulse: 88  68 87  Resp: $Remo'16  19 20  'Qtlwc$ Temp: (!) 97.5 F (36.4 C)  97.8 F (36.6 C) 97.9 F (36.6 C)  TempSrc:   Oral Oral  SpO2: 97%  94% 99%  Weight:  122.2 kg  122.2 kg  Height:        Intake/Output Summary (Last 24 hours) at 02/10/2020 8768 Last data filed at 02/10/2020 0600 Gross per 24 hour  Intake 2531.48 ml  Output 2800 ml  Net -268.52 ml   Last 3 Weights 02/10/2020 02/09/2020 02/03/2020  Weight (lbs) 269 lb 6.4 oz 269 lb 6.4 oz 267 lb 12.8 oz  Weight (kg) 122.2 kg 122.199 kg 121.473 kg      Telemetry    Atrial fibrillation  - Personally Reviewed  ECG    No new EKG to review - Personally Reviewed  Physical Exam   GEN: Well nourished, well developed in no acute distress HEENT: Normal NECK: No JVD; No carotid bruits LYMPHATICS: No lymphadenopathy CARDIAC:irregularly irregular, no murmurs, rubs, gallops RESPIRATORY:   Clear to auscultation without rales, wheezing or rhonchi  ABDOMEN: Soft, non-tender, non-distended MUSCULOSKELETAL:  Trace LE edema; No deformity  SKIN: Warm and dry NEUROLOGIC:  Alert and oriented x 3 PSYCHIATRIC:  Normal affect    Labs    High Sensitivity Troponin:  No results for input(s): TROPONINIHS in the last 720 hours.    Chemistry Recent Labs  Lab 02/08/20 0411 02/09/20 0405 02/10/20 0414  NA 134* 137 140  145  K 3.0* 2.4* 2.8*  2.8*  CL 104 98 97*  98  CO2 16* $Remov'23 25  26  'kDfuvc$ GLUCOSE 125* 165* 115*  113*  BUN 83* 99* PENDING  85*  CREATININE 5.82* 6.82* 8.26*  8.13*  CALCIUM 8.0* 7.7* 7.4*  8.1*  PROT 4.9* 4.6* 4.5*  ALBUMIN 2.3* 2.1* 2.1*  2.3*  AST 14* 17 20  ALT 9 <5 5  ALKPHOS 44 45 44  BILITOT 0.7 0.7 0.7  GFRNONAA 10* 8* 7*  7*  ANIONGAP 14 16* 18*  21*     Hematology Recent Labs  Lab 02/08/20 0411 02/09/20 0405 02/10/20 0414  WBC 2.4* 4.4 8.4  RBC 3.00* 2.72* 2.81*  HGB 10.0* 9.1* 9.5*  HCT 29.5* 25.9* 27.0*  MCV 98.3 95.2 96.1  MCH 33.3 33.5 33.8  MCHC 33.9 35.1 35.2  RDW 14.7  14.9 14.9  PLT 22* 21* 25*    BNPNo results for input(s): BNP, PROBNP in the last 168 hours.   DDimer No results for input(s): DDIMER in the last 168 hours.   Radiology    DG Abd 1 View  Result Date: 02/09/2020 CLINICAL DATA:  Ileus. EXAM: ABDOMEN - 1 VIEW COMPARISON:  February 08, 2020 FINDINGS: Persistent dilation of small bowel loops throughout the abdomen measuring up to 5 cm in diameter, with associated mild small bowel wall thickening. The colon is decompressed with residual foci of gas. IMPRESSION: Persistent small bowel dilation throughout the abdomen, with associated mild small bowel wall thickening, suggestive of ongoing small bowel obstruction versus ileus. Supine positioning precludes proper evaluation for free intra-abdominal gas. Electronically Signed   By: Fidela Salisbury M.D.   On: 02/09/2020 13:23    Cardiac Studies   Echo  10/05/2019 1. Normal LV function; mild LVE; trace MR and TR.  2. Left ventricular ejection fraction, by estimation, is 55 to 60%. The  left ventricle has normal function. The left ventricle has no regional  wall motion abnormalities. The left ventricular internal cavity size was  mildly dilated. Left ventricular  diastolic parameters were normal.  3. Right ventricular systolic function is normal. The right ventricular  size is normal. There is mildly elevated pulmonary artery systolic  pressure.  4. The mitral valve is normal in structure. Trivial mitral valve  regurgitation. No evidence of mitral stenosis.  5. The aortic valve has an indeterminant number of cusps. Aortic valve  regurgitation is not visualized. No aortic stenosis is present.  6. The inferior vena cava is normal in size with greater than 50%  respiratory variability, suggesting right atrial pressure of 3 mmHg.   Stress test 06/2019  There was no ST segment deviation noted during stress.  Findings consistent with prior myocardial infarction with peri-infarct ischemia.  This is an intermediate risk study.  There is a medium size perfusion defect. There is a mild fixed perfusion defect in the mid anterior wall. There is a moderate perfusoin defect in the anteroapex, and mild defect in the apical cap, both with partial reversibility. These findings suggest prior infarct, with peri-infarct ischemia.   Volumetric assessment, EF and wall motion could not be assessed in the absence of ECG gating with atrial fibrillation.    Patient Profile     69 y.o. male with a hx of persistent atrial fibrillation, hypertension hyperlipidemia, mitral regurgitations, PVC, metastatic bladder cancer to intra-abdominal lymph node on chemotherapy, chronic kidney disease IV and chronic anemia who is being seen today for the evaluation of atrial fibrillation with RVR at the request of Dr. Tawanna Solo.   Assessment & Plan    1. Persistent  atrial fibrillation -Patient was in sinus rhythm when seen in atrial fibrillation clinic 10/18.  -Unfortunately no EKG on admit and his Tikosyn was held due to worsening renal function.  -His rate control agent also held on admit secondary to hypovolemia and hypotension. -now NPO with NGT in due to ileus vs. SBO -HR controlled on IV lopressor -change to PO BB once ileus has resolved  CHA2DS2-VASc Score = 2  This indicates a 2.2% annual risk of stroke. The patient's score is based upon: CHF History: 0 HTN History: 1 Diabetes History: 0 Stroke History: 0 Vascular Disease History: 0 Age Score: 1 Gender Score: 0   -His anticoagulation has been held secondary to thrombocytopenia plan to restart when platelets above 30,000 (Plt ct 25K today). -he remains  markedly hypokalemic  likely from GI losses -Keep K > 4 and Mg > 2.0 per TRH -cannot restart Tikosyn due to AKI and has been off anticoagulation for > 48hrs  2.  Thrombocytopenia -related to chemo -S/p transfusion with platelets -Plan to restart Eliquis when platelets are greater than 30K  3. Acute on CKD IV - renal function slightly improved today 5.82>>6.82>>8.26 today (Baseline Scr 1.9-2.6) - followed by nephrology and likely heading towards HD  4. Metastatic bladder cancer - Per oncology     For questions or updates, please contact [place] Please consult [email or web address] for contact info under        Signed, Fransico Him, MD  02/10/2020, 8:28 AM

## 2020-02-10 NOTE — Progress Notes (Signed)
[facility] Progress Note   69 y.o.year-oldafib, HTN, CKD 4baseline creat is 1.9- 2.6 from 2021and bladder cancer w/ mets on chemoRx presented on 10/24 w/ gen'd weakness, diarrhea for 5 days, no appetite or fevers. BP soft 99/70, WBC 0.21, creat 2.55, plt 30. Seen by ONC, got Granix, getting plts today , sp 2u prbc. Pt blood cx's grew klebs pna and pt is on IVF's and IV abx. Creat on admit 2.5 >>up to 3.5->4.1. Urineoutput trend going the wrong direction. Was on dialysis in June 2021 temporarily as well.  Assessment/ Plan:   1. AoCKD 4 - Baseline creat is 1.9- 2.6 from 2021, eGFR 26- 34 ml/min. Creat 2.5 on admitand has steadily increased despite aggressive isotonic resuscitation.AKI likelydue to sepsis/ hypoperfusion.   - Function continues to worsen and in ATN;  he has required dialysis in 09/2019 as well. - I stopped the HCO3 which was causing a shift on 10/30; NGT suction also causing a shift (large output).  - Continue  NS at 150/hr given the high output from NGT and at least keep even.  - Replete K as needed keeping in mind there is an intracellular shift occurring. Will also send urine studies for him.  - Bladder scan PVR 10/30 <27m  - He is still feeling better even as renal function continues to  trend wrong direction. I spoke  with him at length again  and we will  hold off RRT unless there he becomes symptomatic or if numbers necessitate.   [facility]  - Platelets low and would need a transfusion before VIR places a catheter.   2. Klebs pna sepsis - BP's wnl now, on IV abx, not sure source, possibly just d/t neutropenia. 3. Neutropenia/ pancytopenia - per pmd, ONC 4. Atrial fib - home meds are on hold for now given hypotension 5. Metastatic bladder cancer - per pmd  Subjective:   Feeling better after the NGT to wall suction was placed; occasional intermittent  nausea. Still denies dyspnea/ CP/ F/C/N and although he has a good appetite despite being NPO.  3300/1250/2250 out from NG past 3 days.   Objective:   BP 118/61   Pulse 87   Temp 97.9 F (36.6 C) (Oral)   Resp 20   Ht 6' 2" (1.88 m)   Wt 122.2 kg   SpO2 99%   BMI 34.59 kg/m   Intake/Output Summary (Last 24 hours) at 02/10/2020 1033 Last data filed at 02/10/2020 0600 Gross per 24 hour  Intake 2531.48 ml  Output 2800 ml  Net -268.52 ml   Weight change:   Physical Exam: Genalert,supine Chest clear bilatto bases CVirreg irregno MRG Abd soft ntnd no mass or ascites, NGT Exttr edema,no wounds or ulcers  Neuro is alert, Ox 3 , nf, no asterixis  Imaging: DG Abd 1 View  Result Date: 02/09/2020 CLINICAL DATA:  Ileus. EXAM: ABDOMEN - 1 VIEW COMPARISON:  February 08, 2020 FINDINGS: Persistent dilation of small bowel loops throughout the abdomen measuring up to 5 cm in diameter, with associated mild small bowel wall thickening. The colon is decompressed with residual foci of gas. IMPRESSION: Persistent small bowel dilation throughout the abdomen, with associated mild small bowel wall thickening, suggestive of ongoing small bowel obstruction versus ileus. Supine positioning precludes proper evaluation for free intra-abdominal gas. Electronically Signed   By: DFidela SalisburyM.D.   On: 02/09/2020 13:23    Labs:  BMET Recent Labs  Lab 02/04/20 0539 02/05/20 0500 02/06/20 0515 02/07/20 0342 02/08/20 0411 02/09/20 0405 02/10/20 0414  NA 130* 131* 130* 132* 134* 137 140  145  K 4.1 3.1* 3.2* 4.0 3.0* 2.4* 2.8*  2.8*  CL 99 101 100 102 104 98 97*  98  CO2 20* 19* 18* 17* 16* _0 GLUCOSE 186* 174* 155* 175* 125* 165* 115*  113*  BUN 50* 55* 58* 69* 83* 99* 99*  85*  CREATININE 3.49* 3.55* 4.15* 4.75* 5.82* 6.82* 8.26*  8.13*  CALCIUM 8.4* 7.8* 8.0* 8.2* 8.0* 7.7* 7.4*  8.1*  PHOS  --   --   --   --   --   --  5.0*   CBC Recent Labs  Lab  02/07/20 0342 02/08/20 0411 02/09/20 0405 02/10/20 0414  WBC 0.7* 2.4* 4.4 8.4  NEUTROABS 0.3* 1.0* 2.7 5.5  HGB 10.3* 10.0* 9.1* 9.5*  HCT 30.2* 29.5* 25.9* 27.0*  MCV 98.1 98.3 95.2 96.1  PLT 23* 22* 21* 25*    Medications:    . sodium chloride   Intravenous Once  . atorvastatin  40 mg Oral QPM  . Chlorhexidine Gluconate Cloth  6 each Topical Daily  . metoprolol tartrate  5 mg Intravenous Q6H  . oxybutynin  10 mg Oral QHS  . pyridOXINE  200 mg Oral Q breakfast  . saccharomyces boulardii  250 mg Oral BID  . sodium chloride flush  10-40 mL Intracatheter Q12H      Otelia Santee, MD 02/10/2020, 10:33 AM

## 2020-02-10 NOTE — Progress Notes (Signed)
PROGRESS NOTE    [name] Rose  IWP:[id number] DOB: 1950-10-15 DOA: 02/03/2020 PCP: Shirline Frees, MD   Chief Complain: Weakness  Brief Narrative: Patient is a 69 year old male with history of metastatic bladder cancer, currently on chemotherapy, paroxysmal A. fib, hypertension, CKD stage IV who presents to the emergency department complaints of generalized weakness and diarrhea for last 4 to 5 days.  He complains of decreased appetite but no abdominal pain, fevers or chills.  He has been falling frequently at home.  When EMS saw him, he was hypotensive.  On presentation he was hypotensive.  Lab work showed severe leukopenia, thrombocytopenia.  Patient was admitted for the management of sepsis secondary to neutropenia and started on cefepime.  Oncology consulted.  Blood cultures  showing Klebsiella pneumonia.  Hospital course remarkable for progressive AKI so nephrology consulted .  Cardiology also following.  02/08/2020: Patient seen.  Available records reviewed.  Input from oncology, cardiology and nephrology team appreciated.  Worsening renal function is noted.  Concerns that patient may need renal replacement therapy.  Ileus is slowly improving.  Patient reported having broken wind.  NG tube to low suction still in situ.  Neutropenia has resolved.  Severe thrombocytopenia persists.   02/09/2020: Patient seen.  Patient reports feeling better.  Worsening hypokalemia (2.4) and renal function.  Nephrology input is appreciated.  Current plan, as per nephrology team, is to hold starting renal replacement therapy for now as there is no acute indication.  Repeat abdominal x-ray continues to reveal colonic dilatation.  Continue to rate the bowel.  Will replete electrolytes.  02/10/2020: Patient has developed bloody secretions from the NG tube as well as some rectal bleed.  Discussed with the nephrology team, Dr. Otelia Santee, regarding possible decision to start renal replacement therapy considering  recent bleeding.  As per nephrology team, no indication for renal replacement therapy for now.  Oncology team is advised platelet transfusion.  We will also start patient on Protonix drip.  Will monitor H&H every 6 hours.  Guarded prognosis.  Assessment & Plan:   Principal Problem:   Failure to thrive in adult Active Problems:   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 (HCC)   Diarrhea   Chronic a-fib (HCC)   Pancytopenia (HCC)   Fall at home, initial encounter   Atrial fibrillation with RVR (HCC)   Pancytopenia: WBC 0.2, platelets of 30 on presentation.  Pancytopenia secondary to chemotherapy.  Oncology consulted and following.  Continue to monitor CBC.  Transfused with 2 units of PRBC during this hospitalization. He is also being given Granix. He has severe thrombocytopenia and was  transfused with platelets during this hopsitalization. 02/08/2020: WBC today is 2.4, hemoglobin of 10 and platelet count of 22,000.    02/09/2020: WBC is 4.4 today and the platelet count remains 21,000. 02/10/2020: WBC is 8.4 today.  Hemoglobin is 9.5 g/dL.  Platelet count is 25.  Will transfuse patient with platelet as patient is bleeding.  Monitor platelet count closely as patient will be started on Protonix drip.  Severe sepsis secondary to gram-negative bacteremia (Klebsiella pneumonia):Blood cultures now showed pansensitive  Klebsiella pneumonia.On  Cefazoline,will change it to oral to complete 2 weeks course when patient's overall status improves.  Continue cefazolin for now.  Blood pressure stable.  Continue IV fluids. 02/08/2020: Patient has severe sepsis secondary to Klebsiella pneumonia.  Continue antibiotics. 02/10/2020: Continue IV cefazolin.  AKI on CKD stage IV: Baseline creatinine around 2.5.Creatinine bumped up to the range of 4  .  Continue   IV fluids.  He was complaining of severe diarrhea from chemotherapy and it has been going on for several days.  He looked  volume  depleted and dehydrated  .  AKI continues to worsen despite being given IV fluids.  Renal ultrasound did not show any abnormalities.  Urine sodium was 22.  Nephrology consulted and following.  He follows with [facility] as an outpatient.   02/08/2020: Nephrology team is directing care.  Renal function continues to worsen.  Nephrology input is appreciated.  No acute indication for renal replacement therapy.  Low potassium noted (2.4).  Nephrology team is managing. 02/10/2020: Nephrology team is managing.  No renal replacement therapy planned for today.  Permanent  A. fib/A. fib with RVR: Was in A. fib on 02/06/2020.   Cardizem, Toprol at home which have been resumed .  On Eliquis for anticoagulation at home. Eliquis was held due to worsening thrombocytopenia.  We will restart when platelets are more than 30,000.  Tikosyn also held because of worsening kidney function.  Cardiology has been consulted and following  02/10/2020: No anticoagulation for now.    Diarrhea/Abd distension: Most likely associated with recent chemotherapy.  C. difficile negative.   Hospital course remarkable for severe diarrhea which has stopped but he has developed new abdominal distention.  Abdominal x-ray shows possible ileus.  Will check CT abdomen/pelvis without contrast. 02/08/2020: Patient is currently being managed conservatively for ileus.  No diarrhea.  NG tube to low suction. 02/10/2020: Abdominal distention is resolving.  Patient is having bowel movements.  Bowel sounds remain significantly decreased.  We will repeat abdominal x-ray.  Failure to thrive/multiple falls: Decreased appetite, falling frequently at home.    PT consulted.   Continue gentle IV fluids.  Metastatic bladder cancer: Follows with oncology, [name].  On chemotherapy.  He also underwent tumor resection by urology in the past.  Hypertension:   He was hypotensive earlier on presentation,we have resumed his home medications now.BP stable  now  Addendum:  CT abdomen/pelvis without contrast showed diffuse dilation of small bowel loops throughout the abdomen to distal ileum.  Questionable obstruction.  Will start on conservative management with NG tube placement, intermittent suction.  If no improvement with conservative therapy, will consider for general surgery evaluation.Lets keep NPO.will check KUB tomorow  DVT prophylaxis:SCD Code Status: Full Family Communication:  Status is: Inpatient  Remains inpatient appropriate because:Inpatient level of care appropriate due to severity of illness   Dispo: The patient is from: Home              Anticipated d/c is to: Home              Anticipated d/c date is: 3-4 days              Patient currently is not medically stable to d/c.     Consultants:  oncology Nephrology Cardiology  Procedures:None  Antimicrobials:  Anti-infectives (From admission, onward)   Start     Dose/Rate Route Frequency Ordered Stop   02/06/20 1200  ceFAZolin (ANCEF) IVPB 2g/100 mL premix        2 g 200 mL/hr over 30 Minutes Intravenous Every 12 hours 02/06/20 1103     02/05/20 0600  ceFEPIme (MAXIPIME) 2 g in sodium chloride 0.9 % 100 mL IVPB  Status:  Discontinued        2 g 200 mL/hr over 30 Minutes Intravenous Every 24 hours 02/04/20 0719 02/04/20 1150   02/04/20 1400  cefTRIAXone (  ROCEPHIN) 2 g in sodium chloride 0.9 % 100 mL IVPB  Status:  Discontinued        2 g 200 mL/hr over 30 Minutes Intravenous Every 24 hours 02/04/20 1150 02/06/20 1103   02/03/20 1830  ceFEPIme (MAXIPIME) 2 g in sodium chloride 0.9 % 100 mL IVPB  Status:  Discontinued        2 g 200 mL/hr over 30 Minutes Intravenous Every 12 hours 02/03/20 1825 02/04/20 0719      Subjective: -Bloody drainage from NG tube and rectal bleed  Objective: Vitals:   02/10/20 0423 02/10/20 1331 02/10/20 1419 02/10/20 1435  BP: 118/61 126/76 130/83 127/75  Pulse: 87 92 77 88  Resp: $Remo'20 20 18 18  'rBpgX$ Temp: 97.9 F (36.6 C) 97.6 F  (36.4 C) 97.9 F (36.6 C) 98.1 F (36.7 C)  TempSrc: Oral Oral Oral Oral  SpO2: 99% 96% 94% 97%  Weight: 122.2 kg     Height:        Intake/Output Summary (Last 24 hours) at 02/10/2020 1607 Last data filed at 02/10/2020 1300 Gross per 24 hour  Intake 2531.48 ml  Output 2850 ml  Net -318.52 ml   Filed Weights   02/03/20 1416 02/09/20 1227 02/10/20 0423  Weight: 121.5 kg 122.2 kg 122.2 kg    Examination:   General exam: Weak, acutely ill looking.  Obese Respiratory system: Decreased air entry. Cardiovascular system: Irregularly irregular rhythm.  JVD is difficult to assess.  Neck is supple. Gastrointestinal system: Abdomen is obese, soft and nontender.  Significantly decreased bowel sounds.  Organs are difficult to assess.   Central nervous system: Alert and oriented.  Patient moves all extremities.    Data Reviewed: I have personally reviewed following labs and imaging studies  CBC: Recent Labs  Lab 02/06/20 0515 02/07/20 0342 02/08/20 0411 02/09/20 0405 02/10/20 0414  WBC 0.3* 0.7* 2.4* 4.4 8.4  NEUTROABS  --  0.3* 1.0* 2.7 5.5  HGB 10.0* 10.3* 10.0* 9.1* 9.5*  HCT 28.3* 30.2* 29.5* 25.9* 27.0*  MCV 95.3 98.1 98.3 95.2 96.1  PLT 24* 23* 22* 21* 25*   Basic Metabolic Panel: Recent Labs  Lab 02/05/20 0500 02/05/20 0500 02/06/20 0515 02/07/20 0342 02/08/20 0411 02/09/20 0405 02/10/20 0414  NA 131*   < > 130* 132* 134* 137 140  145  K 3.1*   < > 3.2* 4.0 3.0* 2.4* 2.8*  2.8*  CL 101   < > 100 102 104 98 97*  98  CO2 19*   < > 18* 17* 16* $Remov'23 25  26  'cGmkcw$ GLUCOSE 174*   < > 155* 175* 125* 165* 115*  113*  BUN 55*   < > 58* 69* 83* 99* 99*  85*  CREATININE 3.55*   < > 4.15* 4.75* 5.82* 6.82* 8.26*  8.13*  CALCIUM 7.8*   < > 8.0* 8.2* 8.0* 7.7* 7.4*  8.1*  MG 1.7  --   --  2.3  --   --  2.3  PHOS  --   --   --   --   --   --  5.0*   < > = values in this interval not displayed.   GFR: Estimated Creatinine Clearance: 11.9 mL/min (A) (by C-G formula  based on SCr of 8.26 mg/dL (H)). Liver Function Tests: Recent Labs  Lab 02/08/20 0411 02/09/20 0405 02/10/20 0414  AST 14* 17 20  ALT 9 <5 5  ALKPHOS 44 45 44  BILITOT 0.7 0.7 0.7  PROT 4.9* 4.6* 4.5*  ALBUMIN 2.3* 2.1* 2.1*  2.3*   No results for input(s): LIPASE, AMYLASE in the last 168 hours. No results for input(s): AMMONIA in the last 168 hours. Coagulation Profile: No results for input(s): INR, PROTIME in the last 168 hours. Cardiac Enzymes: No results for input(s): CKTOTAL, CKMB, CKMBINDEX, TROPONINI in the last 168 hours. BNP (last 3 results) No results for input(s): PROBNP in the last 8760 hours. HbA1C: No results for input(s): HGBA1C in the last 72 hours. CBG: Recent Labs  Lab 02/08/20 0738 02/08/20 1125 02/08/20 1632 02/08/20 2106 02/09/20 0716  GLUCAP 117* 116* 135* 122* 171*   Lipid Profile: No results for input(s): CHOL, HDL, LDLCALC, TRIG, CHOLHDL, LDLDIRECT in the last 72 hours. Thyroid Function Tests: No results for input(s): TSH, T4TOTAL, FREET4, T3FREE, THYROIDAB in the last 72 hours. Anemia Panel: No results for input(s): VITAMINB12, FOLATE, FERRITIN, TIBC, IRON, RETICCTPCT in the last 72 hours. Sepsis Labs: No results for input(s): PROCALCITON, LATICACIDVEN in the last 168 hours.  Recent Results (from the past 240 hour(s))  Respiratory Panel by RT PCR (Flu A&B, Covid) - Nasopharyngeal Swab     Status: None   Collection Time: 02/03/20 11:28 AM   Specimen: Nasopharyngeal Swab  Result Value Ref Range Status   SARS Coronavirus 2 by RT PCR NEGATIVE NEGATIVE Final    Comment: (NOTE) SARS-CoV-2 target nucleic acids are NOT DETECTED.  The SARS-CoV-2 RNA is generally detectable in upper respiratoy specimens during the acute phase of infection. The lowest concentration of SARS-CoV-2 viral copies this assay can detect is 131 copies/mL. A negative result does not preclude SARS-Cov-2 infection and should not be used as the sole basis for treatment  or other patient management decisions. A negative result may occur with  improper specimen collection/handling, submission of specimen other than nasopharyngeal swab, presence of viral mutation(s) within the areas targeted by this assay, and inadequate number of viral copies (<131 copies/mL). A negative result must be combined with clinical observations, patient history, and epidemiological information. The expected result is Negative.  Fact Sheet for Patients:  PinkCheek.be  Fact Sheet for Healthcare Providers:  GravelBags.it  This test is no t yet approved or cleared by the [place] FDA and  has been authorized for detection and/or diagnosis of SARS-CoV-2 by FDA under an Emergency Use Authorization (EUA). This EUA will remain  in effect (meaning this test can be used) for the duration of the COVID-19 declaration under Section 564(b)(1) of the Act, 21 U.S.C. section 360bbb-3(b)(1), unless the authorization is terminated or revoked sooner.     Influenza A by PCR NEGATIVE NEGATIVE Final   Influenza B by PCR NEGATIVE NEGATIVE Final    Comment: (NOTE) The Xpert Xpress SARS-CoV-2/FLU/RSV assay is intended as an aid in  the diagnosis of influenza from Nasopharyngeal swab specimens and  should not be used as a sole basis for treatment. Nasal washings and  aspirates are unacceptable for Xpert Xpress SARS-CoV-2/FLU/RSV  testing.  Fact Sheet for Patients: PinkCheek.be  Fact Sheet for Healthcare Providers: GravelBags.it  This test is not yet approved or cleared by the [place] FDA and  has been authorized for detection and/or diagnosis of SARS-CoV-2 by  FDA under an Emergency Use Authorization (EUA). This EUA will remain  in effect (meaning this test can be used) for the duration of the  Covid-19 declaration under Section 564(b)(1) of the Act, 21  U.S.C.  section 360bbb-3(b)(1), unless the authorization is  terminated or revoked. Performed at  [facility], [place] [address] [place], [place] [address]   Culture, blood (x 2)     Status: Abnormal   Collection Time: 02/03/20  6:50 PM   Specimen: BLOOD RIGHT HAND  Result Value Ref Range Status   Specimen Description   Final    BLOOD RIGHT HAND Performed at [facility] [address] [place], [place] [address]    Special Requests   Final    BOTTLES DRAWN AEROBIC ONLY Blood Culture results may not be optimal due to an inadequate volume of blood received in culture bottles Performed at [facility] [address] [place], [place] [address]    Culture  Setup Time   Final    GRAM NEGATIVE RODS AEROBIC BOTTLE ONLY CRITICAL VALUE NOTED.  VALUE IS CONSISTENT WITH PREVIOUSLY REPORTED AND CALLED VALUE.    Culture (A)  Final    KLEBSIELLA PNEUMONIAE SUSCEPTIBILITIES PERFORMED ON PREVIOUS CULTURE WITHIN THE LAST 5 DAYS. Performed at [facility], [place] [address], [place], [place] [address]    Report Status 02/06/2020 FINAL  Final  Culture, blood (x 2)     Status: Abnormal   Collection Time: 02/03/20  6:50 PM   Specimen: BLOOD  Result Value Ref Range Status   Specimen Description   Final    BLOOD RIGHT ANTECUBITAL Performed at [facility], [place] [address], [place], [place] [address]    Special Requests   Final    BOTTLES DRAWN AEROBIC ONLY Blood Culture results may not be optimal due to an excessive volume of blood received in culture bottles Performed at [facility] [address], [place], [place] [address]    Culture  Setup Time   Final    GRAM NEGATIVE RODS AEROBIC BOTTLE ONLY CRITICAL RESULT CALLED TO, READ BACK BY AND VERIFIED WITH: T,GREEN PHARMD $RemoveBefore'@1113'[id number]$  02/04/20 EB Performed at [facility] [address], [place], [place] [address]    Culture KLEBSIELLA PNEUMONIAE (A)  Final   Report  Status 02/06/2020 FINAL  Final   Organism ID, Bacteria KLEBSIELLA PNEUMONIAE  Final      Susceptibility   Klebsiella pneumoniae - MIC*    AMPICILLIN >=32 RESISTANT Resistant     CEFAZOLIN <=4 SENSITIVE Sensitive     CEFEPIME <=0.12 SENSITIVE Sensitive     CEFTAZIDIME <=1 SENSITIVE Sensitive     CEFTRIAXONE <=0.25 SENSITIVE Sensitive     CIPROFLOXACIN <=0.25 SENSITIVE Sensitive     GENTAMICIN <=1 SENSITIVE Sensitive     IMIPENEM <=0.25 SENSITIVE Sensitive     TRIMETH/SULFA <=20 SENSITIVE Sensitive     AMPICILLIN/SULBACTAM 4 SENSITIVE Sensitive     PIP/TAZO <=4 SENSITIVE Sensitive     * KLEBSIELLA PNEUMONIAE  Blood Culture ID Panel (Reflexed)     Status: Abnormal   Collection Time: 02/03/20  6:50 PM  Result Value Ref Range Status   Enterococcus faecalis NOT DETECTED NOT DETECTED Final   Enterococcus Faecium NOT DETECTED NOT DETECTED Final   Listeria monocytogenes NOT DETECTED NOT DETECTED Final   Staphylococcus species NOT DETECTED NOT DETECTED Final   Staphylococcus aureus (BCID) NOT DETECTED NOT DETECTED Final   Staphylococcus epidermidis NOT DETECTED NOT DETECTED Final   Staphylococcus lugdunensis NOT DETECTED NOT DETECTED Final   Streptococcus species NOT DETECTED NOT DETECTED Final   Streptococcus agalactiae NOT DETECTED NOT DETECTED Final   Streptococcus pneumoniae NOT DETECTED NOT DETECTED Final   Streptococcus pyogenes NOT DETECTED NOT DETECTED Final  A.calcoaceticus-baumannii NOT DETECTED NOT DETECTED Final   Bacteroides fragilis NOT DETECTED NOT DETECTED Final   Enterobacterales DETECTED (A) NOT DETECTED Final    Comment: Enterobacterales represent a large order of gram negative bacteria, not a single organism. CRITICAL RESULT CALLED TO, READ BACK BY AND VERIFIED WITH: T,GREEN PHARMD @1113  02/04/20 EB    Enterobacter cloacae complex NOT DETECTED NOT DETECTED Final   Escherichia coli NOT DETECTED NOT DETECTED Final   Klebsiella aerogenes NOT DETECTED NOT DETECTED Final    Klebsiella oxytoca NOT DETECTED NOT DETECTED Final   Klebsiella pneumoniae DETECTED (A) NOT DETECTED Final    Comment: CRITICAL RESULT CALLED TO, READ BACK BY AND VERIFIED WITH: T,GREEN PHARMD @1113  02/04/20 EB    Proteus species NOT DETECTED NOT DETECTED Final   Salmonella species NOT DETECTED NOT DETECTED Final   Serratia marcescens NOT DETECTED NOT DETECTED Final   Haemophilus influenzae NOT DETECTED NOT DETECTED Final   Neisseria meningitidis NOT DETECTED NOT DETECTED Final   Pseudomonas aeruginosa NOT DETECTED NOT DETECTED Final   Stenotrophomonas maltophilia NOT DETECTED NOT DETECTED Final   Candida albicans NOT DETECTED NOT DETECTED Final   Candida auris NOT DETECTED NOT DETECTED Final   Candida glabrata NOT DETECTED NOT DETECTED Final   Candida krusei NOT DETECTED NOT DETECTED Final   Candida parapsilosis NOT DETECTED NOT DETECTED Final   Candida tropicalis NOT DETECTED NOT DETECTED Final   Cryptococcus neoformans/gattii NOT DETECTED NOT DETECTED Final   CTX-M ESBL NOT DETECTED NOT DETECTED Final   Carbapenem resistance IMP NOT DETECTED NOT DETECTED Final   Carbapenem resistance KPC NOT DETECTED NOT DETECTED Final   Carbapenem resistance NDM NOT DETECTED NOT DETECTED Final   Carbapenem resist OXA 48 LIKE NOT DETECTED NOT DETECTED Final   Carbapenem resistance VIM NOT DETECTED NOT DETECTED Final    Comment: Performed at [facility], [address] [place], [address] [place]  C Difficile Quick Screen w PCR reflex     Status: None   Collection Time: 02/04/20  5:07 PM   Specimen: STOOL  Result Value Ref Range Status   C Diff antigen NEGATIVE NEGATIVE Final   C Diff toxin NEGATIVE NEGATIVE Final   C Diff interpretation No C. difficile detected.  Final    Comment: Performed at [facility], [address] [place], [place]  Culture, blood (Routine X 2) w Reflex to ID Panel     Status: None (Preliminary result)   Collection Time: 02/09/20  10:48 AM   Specimen: BLOOD  Result Value Ref Range Status   Specimen Description   Final    BLOOD LEFT ARM Performed at [facility], [address], [place], [place]    Special Requests   Final    BOTTLES DRAWN AEROBIC AND ANAEROBIC Blood Culture adequate volume Performed at [facility], [address], [place], [place]    Culture   Final    NO GROWTH < 24 HOURS Performed at [facility], [address], [place], [address] [place]    Report Status PENDING  Incomplete  Culture, blood (Routine X 2) w Reflex to ID Panel     Status: None (Preliminary result)   Collection Time: 02/09/20 10:50 AM   Specimen: BLOOD  Result Value Ref Range Status   Specimen Description   Final    BLOOD LEFT ARM Performed at [facility], [address] [place], [place]  Special Requests   Final    BOTTLES DRAWN AEROBIC AND ANAEROBIC Blood Culture adequate volume Performed at [facility] [address], [place], [place] [address]    Culture   Final    NO GROWTH < 24 HOURS Performed at [facility] [address] [place], [place] [address]    Report Status PENDING  Incomplete         Radiology Studies: DG Abd 1 View  Result Date: 02/09/2020 CLINICAL DATA:  Ileus. EXAM: ABDOMEN - 1 VIEW COMPARISON:  February 08, 2020 FINDINGS: Persistent dilation of small bowel loops throughout the abdomen measuring up to 5 cm in diameter, with associated mild small bowel wall thickening. The colon is decompressed with residual foci of gas. IMPRESSION: Persistent small bowel dilation throughout the abdomen, with associated mild small bowel wall thickening, suggestive of ongoing small bowel obstruction versus ileus. Supine positioning precludes proper evaluation for free intra-abdominal gas. Electronically Signed   By: Fidela Salisbury M.D.   On: 02/09/2020 13:23        Scheduled  Meds: . sodium chloride   Intravenous Once  . atorvastatin  40 mg Oral QPM  . Chlorhexidine Gluconate Cloth  6 each Topical Daily  . metoprolol tartrate  5 mg Intravenous Q6H  . oxybutynin  10 mg Oral QHS  . [START ON 02/14/2020] pantoprazole  40 mg Intravenous Q12H  . pyridOXINE  200 mg Oral Q breakfast  . saccharomyces boulardii  250 mg Oral BID  . sodium chloride flush  10-40 mL Intracatheter Q12H   Continuous Infusions: . sodium chloride 150 mL/hr at 02/09/20 1157  .  ceFAZolin (ANCEF) IV 2 g (02/10/20 1021)  . pantoprozole (PROTONIX) infusion    . pantoprazole (PROTONIX) IVPB    . potassium chloride 10 mEq (02/10/20 1300)     LOS: 7 days    Time spent: 35 mins.   Bonnell Public, MD [facility] P10/31/2021, 4:07 PM

## 2020-02-11 DIAGNOSIS — R627 Adult failure to thrive: Secondary | ICD-10-CM | POA: Diagnosis not present

## 2020-02-11 DIAGNOSIS — I4891 Unspecified atrial fibrillation: Secondary | ICD-10-CM | POA: Diagnosis not present

## 2020-02-11 LAB — GASTROINTESTINAL PANEL BY PCR, STOOL (REPLACES STOOL CULTURE)

## 2020-02-11 LAB — CBC WITH DIFFERENTIAL/PLATELET
Abs Immature Granulocytes: 0.56 10*3/uL — ABNORMAL HIGH (ref 0.00–0.07)
Basophils Absolute: 0 10*3/uL (ref 0.0–0.1)
Basophils Relative: 0 %
Eosinophils Absolute: 0 10*3/uL (ref 0.0–0.5)
Eosinophils Relative: 0 %
HCT: 24.2 % — ABNORMAL LOW (ref 39.0–52.0)
Hemoglobin: 8.1 g/dL — ABNORMAL LOW (ref 13.0–17.0)
Immature Granulocytes: 6 %
Lymphocytes Relative: 11 %
Lymphs Abs: 1.1 10*3/uL (ref 0.7–4.0)
MCH: 33.1 pg (ref 26.0–34.0)
MCHC: 33.5 g/dL (ref 30.0–36.0)
MCV: 98.8 fL (ref 80.0–100.0)
Monocytes Absolute: 1.2 10*3/uL — ABNORMAL HIGH (ref 0.1–1.0)
Monocytes Relative: 12 %
Neutro Abs: 7 10*3/uL (ref 1.7–7.7)
Neutrophils Relative %: 71 %
Platelets: 35 10*3/uL — ABNORMAL LOW (ref 150–400)
RBC: 2.45 MIL/uL — ABNORMAL LOW (ref 4.22–5.81)
RDW: 15 % (ref 11.5–15.5)
WBC: 9.9 10*3/uL (ref 4.0–10.5)
nRBC: 0 % (ref 0.0–0.2)

## 2020-02-11 LAB — COMPREHENSIVE METABOLIC PANEL
ALT: 5 U/L (ref 0–44)
AST: 20 U/L (ref 15–41)
Albumin: 2.2 g/dL — ABNORMAL LOW (ref 3.5–5.0)
Alkaline Phosphatase: 47 U/L (ref 38–126)
Anion gap: 20 — ABNORMAL HIGH (ref 5–15)
BUN: 104 mg/dL — ABNORMAL HIGH (ref 8–23)
CO2: 23 mmol/L (ref 22–32)
Calcium: 7.2 mg/dL — ABNORMAL LOW (ref 8.9–10.3)
Chloride: 99 mmol/L (ref 98–111)
Creatinine, Ser: 9.21 mg/dL — ABNORMAL HIGH (ref 0.61–1.24)
GFR, Estimated: 6 mL/min — ABNORMAL LOW (ref >60)
Glucose, Bld: 126 mg/dL — ABNORMAL HIGH (ref 70–99)
Potassium: 3.5 mmol/L (ref 3.5–5.1)
Sodium: 142 mmol/L (ref 135–145)
Total Bilirubin: 1.1 mg/dL (ref 0.3–1.2)
Total Protein: 4.3 g/dL — ABNORMAL LOW (ref 6.5–8.1)

## 2020-02-11 LAB — PREPARE PLATELET PHERESIS: Unit division: 0

## 2020-02-11 LAB — BPAM PLATELET PHERESIS
Blood Product Expiration Date: 202110312359
ISSUE DATE / TIME: 202110311411
Unit Type and Rh: 6200

## 2020-02-11 MED ORDER — CEFAZOLIN SODIUM-DEXTROSE 1-4 GM/50ML-% IV SOLN
1.0000 g | INTRAVENOUS | Status: DC
  Administered 2020-02-12: 1 g via INTRAVENOUS
  Filled 2020-02-11 (×2): qty 50

## 2020-02-11 NOTE — Progress Notes (Addendum)
Progress Note  Patient Name: [name] Rose Date of Encounter: 02/11/2020  [facility] Cardiologist: Pixie Casino, MD   Subjective   No acute overnight events. NG tube still in place. Patient continues to have diarrhea. No cardiac complaints. No chest pain or shortness of breath. In atrial fibrillation but rates well controlled. Renal function continues to worsen - plan is for dialysis.  Inpatient Medications    Scheduled Meds:  sodium chloride   Intravenous Once   atorvastatin  40 mg Oral QPM   Chlorhexidine Gluconate Cloth  6 each Topical Daily   metoprolol tartrate  5 mg Intravenous Q6H   oxybutynin  10 mg Oral QHS   pyridOXINE  200 mg Oral Q breakfast   saccharomyces boulardii  250 mg Oral BID   sodium chloride flush  10-40 mL Intracatheter Q12H   Continuous Infusions:  sodium chloride 150 mL/hr at 02/11/20 0902   [START ON 02/12/2020]  ceFAZolin (ANCEF) IV     pantoprozole (PROTONIX) infusion 8 mg/hr (02/11/20 0901)   PRN Meds: acetaminophen **OR** [DISCONTINUED] acetaminophen, chlorproMAZINE, melatonin, ondansetron **OR** ondansetron (ZOFRAN) IV, sodium chloride flush   Vital Signs    Vitals:   02/10/20 1435 02/10/20 1627 02/10/20 2012 02/11/20 0409  BP: 127/75 130/81 133/75 114/66  Pulse: 88 93 86 84  Resp: $Remo'18 16  18  'MtaeD$ Temp: 98.1 F (36.7 C) 98.1 F (36.7 C) 97.8 F (36.6 C) 98 F (36.7 C)  TempSrc: Oral Oral Oral Oral  SpO2: 97% 96% 99% 92%  Weight:    121.7 kg  Height:        Intake/Output Summary (Last 24 hours) at 02/11/2020 1047 Last data filed at 02/11/2020 0600 Gross per 24 hour  Intake 2977.07 ml  Output 3175 ml  Net -197.93 ml   Last 3 Weights 02/11/2020 02/10/2020 02/09/2020  Weight (lbs) 268 lb 3.2 oz 269 lb 6.4 oz 269 lb 6.4 oz  Weight (kg) 121.655 kg 122.2 kg 122.199 kg      Telemetry    Atrial fibrillation with rates in the 70's to 80's. PVCs noted, sometimes in bigeminy patterm. - Personally Reviewed  ECG    No new ECG  tracing today. - Personally Reviewed  Physical Exam   GEN: No acute distress.   Neck: Supple. Cardiac: Irregularly irregular rhythm with normal rate. No murmurs, rubs, or gallops.  Respiratory: Clear to auscultation bilaterally. GI: NG tube in place. Soft, non-.tender, non-distended  MS: No lower extremity edema; No deformity. Neuro:  No focal deficits. Psych: Normal affect   Labs    High Sensitivity Troponin:  No results for input(s): TROPONINIHS in the last 720 hours.    Chemistry Recent Labs  Lab 02/09/20 0405 02/10/20 0414 02/11/20 0326  NA 137 140  145 142  K 2.4* 2.8*  2.8* 3.5  CL 98 97*  98 99  CO2 $Re'23 25  26 23  'djn$ GLUCOSE 165* 115*  113* 126*  BUN 99* 99*  85* 104*  CREATININE 6.82* 8.26*  8.13* 9.21*  CALCIUM 7.7* 7.4*  8.1* 7.2*  PROT 4.6* 4.5* 4.3*  ALBUMIN 2.1* 2.1*  2.3* 2.2*  AST $Re'17 20 20  'axK$ ALT '5 5 5  '$ ALKPHOS 45 44 47  BILITOT 0.7 0.7 1.1  GFRNONAA 8* 7*  7* 6*  ANIONGAP 16* 18*  21* 20*     Hematology Recent Labs  Lab 02/10/20 0414 02/10/20 1820 02/11/20 0326  WBC 8.4 9.6 9.9  RBC 2.81* 2.80* 2.45*  HGB 9.5* 9.2*  8.1*  HCT 27.0* 27.2* 24.2*  MCV 96.1 97.1 98.8  MCH 33.8 32.9 33.1  MCHC 35.2 33.8 33.5  RDW 14.9 15.0 15.0  PLT 25* 34* 35*    BNPNo results for input(s): BNP, PROBNP in the last 168 hours.   DDimer No results for input(s): DDIMER in the last 168 hours.   Radiology    DG Abd 1 View  Result Date: 02/10/2020 CLINICAL DATA:  Small bowel obstruction. EXAM: ABDOMEN - 1 VIEW COMPARISON:  February 09, 2020 FINDINGS: Interval placement of enteric catheter, with side hole at the expected location of the GE junction. Persistent dilation of small bowel loops with mild wall thickening in the mid abdomen, with maximum diameter of 5 cm. IMPRESSION: 1. Interval placement of enteric catheter, with side hole at the expected location of the GE junction. Advancement is recommended. 2. Persistent small bowel obstruction. Electronically  Signed   By: Fidela Salisbury M.D.   On: 02/10/2020 17:03   DG Abd 1 View  Result Date: 02/09/2020 CLINICAL DATA:  Ileus. EXAM: ABDOMEN - 1 VIEW COMPARISON:  February 08, 2020 FINDINGS: Persistent dilation of small bowel loops throughout the abdomen measuring up to 5 cm in diameter, with associated mild small bowel wall thickening. The colon is decompressed with residual foci of gas. IMPRESSION: Persistent small bowel dilation throughout the abdomen, with associated mild small bowel wall thickening, suggestive of ongoing small bowel obstruction versus ileus. Supine positioning precludes proper evaluation for free intra-abdominal gas. Electronically Signed   By: Fidela Salisbury M.D.   On: 02/09/2020 13:23    Cardiac Studies   Echocardiogram 10/05/2019: Impressions:  1. Normal LV function; mild LVE; trace MR and TR.   2. Left ventricular ejection fraction, by estimation, is 55 to 60%. The  left ventricle has normal function. The left ventricle has no regional  wall motion abnormalities. The left ventricular internal cavity size was  mildly dilated. Left ventricular  diastolic parameters were normal.   3. Right ventricular systolic function is normal. The right ventricular  size is normal. There is mildly elevated pulmonary artery systolic  pressure.   4. The mitral valve is normal in structure. Trivial mitral valve  regurgitation. No evidence of mitral stenosis.   5. The aortic valve has an indeterminant number of cusps. Aortic valve  regurgitation is not visualized. No aortic stenosis is present.   6. The inferior vena cava is normal in size with greater than 50%  respiratory variability, suggesting right atrial pressure of 3 mmHg.  Patient Profile     69 y.o. male with a history of persistent atrial fibrillation on Tikosyn and Eliquis, PVCs,  mitral regurgitation, hypertension, hyperlipidemia, metastatic bladder cancer to intra-abdominal lymph node on chemotherapy, chronic anemia,  and CKD stage IV who is being seen today for evaluation of atrial fibrillation with RVR at the request of Dr. Tawanna Solo.  Assessment & Plan    Persistent Atrial Fibrillation - Patient was in sinus rhythm when seen in A. Fib clinic on 10/18. Unfortunately, there was no EKG on admit and Tikosyn was held due to worsening renal function. - Initially very hypokalemia (likely due to GI loses) but has improved with supplementation. - Keep Potassium > 4.0 and Magnesium > 2.0 per primary team. - Currently rate controlled. - Continue IV Lopressor for rate control. Can change to PO when ileus has resolved and able to take oral medications. - CHA2DS2-VASc = 2 (HTN and age). On Eliquis at home. However, this was held on admission  due to thrombocytopenia. Platelets 35,000. Discussed with MD - will continue to hold anticoagulation for now.  Thrombocytopenia - Secondary to chemo. - S/p transfusion of platelets. - Management per primary team and Oncology.   Acute on CKD Stage IV - Creatinine continues to trend up: 5.82 >> 6.82 >> 8.13 >> 8.26 >> 9.21. Baseline 1.9 to 2.6. - Nephrology consulted. AKI felt to be due to sepsis/hypoperfusion. - Plan is to start dialysis.  Metastatic Bladder Cancer - Management per primary team.  Chronic Anemia - Hemoglobin 8.1 today, down from 9.2 yesterday. - Management per primary team.  For questions or updates, please contact [place] Please consult [email or web address] for contact info under        Signed, Darreld Mclean, PA-C  02/11/2020, 10:47 AM    Patient examined chart reviewed Discussed care with patient , wife and PA. Afib is well rate controlled. Currently tikosyn and anticoagulation d/c due to renal failure and low PLTls. PLT cound only 35 and with qualitative platelet abnormality from renal failure would not restart anticoagulation quite yet. Will need different AAT once back on anticoagulaiton due to renal failure Transferring to [facility] tomorrow for  dialysis Exam notable for afib rates 60-70 bpm NGT still draining bilious material no uremic rub on exam   Jenkins Rouge MD [facility]

## 2020-02-11 NOTE — Progress Notes (Addendum)
[facility] Progress Note   69 y.o.year-oldafib, HTN, CKD 4baseline creat is 1.9- 2.6 from 2021and bladder cancer w/ mets on chemoRx presented on 10/24 w/ gen'd weakness, diarrhea for 5 days, no appetite or fevers. BP soft 99/70, WBC 0.21, creat 2.55, plt 30. Seen by ONC, got Granix, getting plts today , sp 2u prbc. Pt blood cx's grew klebs pna and pt is on IVF's and IV abx. Creat on admit 2.5 >>up to 3.5->4.1. Urineoutput trend going the wrong direction. Was on dialysis in June 2021 temporarily as well.  Assessment/ Plan:   1. AoCKD 4 - Baseline creat is 1.9- 2.6 from 2021, eGFR 26- 34 ml/min. Creat 2.5 on admitand has steadily increased despite aggressive isotonic resuscitation.AKI likelydue to sepsis/ hypoperfusion causing ATN.   - IV HCO3 was stopped due to causing a shift on 10/30; NGT suction also causing a shift due to large output  - Will ^ NS 0.9% to 200 cc/hr given high NG output, attempt to keep a bit positive today - Replete K as needed keeping in mind there is IC shift occurring - Bladder scan PVR 10/30 <51m - renal function continues to worsen due to ATN;  B/Cr up today 104/ 9.2, from 99/ 8.26 yesterday. He has required dialysis in 09/2019.Had n/v yesterday, may be early uremia. No other potentially uremic issues. Volume okay. Will request tx to [facility], may need HD in next 1 -2 days.  - Platelets low and would possibly need transfusion before cath placement   2. Klebs pna sepsis - BP's wnl now, on IV abx, not sure source, possibly just d/t neutropenia. 3. Neutropenia/ pancytopenia - per pmd, ONC 4. Atrial fib - home meds are on hold for now given hypotension 5. Metastatic bladder cancer - per pmd  RKelly Splinter MD 02/11/2020, 11:46 AM      Subjective:   I/O close to even. Had N/V yesterday x 1.  No confusion or jerking.    Objective:   BP 114/66 (BP Location: Left Arm)   Pulse 84   Temp 98 F (36.7 C) (Oral)   Resp 18   Ht '6\' 2"'   (1.88 m)   Wt 121.7 kg   SpO2 92%   BMI 34.43 kg/m   Intake/Output Summary (Last 24 hours) at 02/11/2020 1139 Last data filed at 02/11/2020 0600 Gross per 24 hour  Intake 2977.07 ml  Output 3175 ml  Net -197.93 ml   Weight change: -0.544 kg  Physical Exam: Genalert,supine Chest clear bilatto bases CVirreg irregno MRG Abd soft ntnd no mass or ascites, NGT Exttr edema pretib  Neuro is alert, Ox 3 , nf, no asterixis  Imaging: DG Abd 1 View  Result Date: 02/10/2020 CLINICAL DATA:  Small bowel obstruction. EXAM: ABDOMEN - 1 VIEW COMPARISON:  February 09, 2020 FINDINGS: Interval placement of enteric catheter, with side hole at the expected location of the GE junction. Persistent dilation of small bowel loops with mild wall thickening in the mid abdomen, with maximum diameter of 5 cm. IMPRESSION: 1. Interval placement of enteric catheter, with side hole at the expected location of the GE junction. Advancement is recommended. 2. Persistent small bowel obstruction. Electronically Signed   By: DFidela SalisburyM.D.   On: 02/10/2020 17:03   DG Abd 1 View  Result Date: 02/09/2020 CLINICAL DATA:  Ileus. EXAM: ABDOMEN - 1 VIEW COMPARISON:  February 08, 2020 FINDINGS: Persistent dilation of small bowel loops throughout the abdomen measuring up to 5 cm in diameter, with associated  mild small bowel wall thickening. The colon is decompressed with residual foci of gas. IMPRESSION: Persistent small bowel dilation throughout the abdomen, with associated mild small bowel wall thickening, suggestive of ongoing small bowel obstruction versus ileus. Supine positioning precludes proper evaluation for free intra-abdominal gas. Electronically Signed   By: Fidela Salisbury M.D.   On: 02/09/2020 13:23    Labs: BMET Recent Labs  Lab 02/05/20 0500 02/06/20 0515 02/07/20 0342 02/08/20 0411 02/09/20 0405 02/10/20 0414 02/11/20 0326  NA 131* 130* 132* 134* 137 140  145 142  K 3.1* 3.2* 4.0 3.0*  2.4* 2.8*  2.8* 3.5  CL 101 100 102 104 98 97*  98 99  CO2 19* 18* 17* 16* '23 25  26 23  ' GLUCOSE 174* 155* 175* 125* 165* 115*  113* 126*  BUN 55* 58* 69* 83* 99* 99*  85* 104*  CREATININE 3.55* 4.15* 4.75* 5.82* 6.82* 8.26*  8.13* 9.21*  CALCIUM 7.8* 8.0* 8.2* 8.0* 7.7* 7.4*  8.1* 7.2*  PHOS  --   --   --   --   --  5.0*  --    CBC Recent Labs  Lab 02/09/20 0405 02/10/20 0414 02/10/20 1820 02/11/20 0326  WBC 4.4 8.4 9.6 9.9  NEUTROABS 2.7 5.5 7.3 7.0  HGB 9.1* 9.5* 9.2* 8.1*  HCT 25.9* 27.0* 27.2* 24.2*  MCV 95.2 96.1 97.1 98.8  PLT 21* 25* 34* 35*    Medications:    . sodium chloride   Intravenous Once  . atorvastatin  40 mg Oral QPM  . Chlorhexidine Gluconate Cloth  6 each Topical Daily  . metoprolol tartrate  5 mg Intravenous Q6H  . oxybutynin  10 mg Oral QHS  . pyridOXINE  200 mg Oral Q breakfast  . saccharomyces boulardii  250 mg Oral BID  . sodium chloride flush  10-40 mL Intracatheter Q12H

## 2020-02-11 NOTE — Care Management Important Message (Signed)
Important Message  Patient Details IM Letter given to the Patient Name: [name] Rose MRN: [id number] Date of Birth: [birth year]   Medicare Important Message Given:  Yes     Kerin Salen 02/11/2020, 10:09 AM

## 2020-02-11 NOTE — Progress Notes (Signed)
Patient has arrived onto the unit from [facility] via [facility]. Patient transferred onto low bed from stretcher. Pt's telemonitor has been applied. PIV, NG tube, and chest port assessed. VS has been taken and are stable. Patient is alert and oriented. Patient belongings are clothes and cellphone, they are at bedside. CCMD has been notified.

## 2020-02-11 NOTE — Plan of Care (Signed)
  Problem: Skin Integrity: Goal: Risk for impaired skin integrity will decrease Outcome: Progressing   Problem: Safety: Goal: Ability to remain free from injury will improve Outcome: Progressing   Problem: Pain Managment: Goal: General experience of comfort will improve Outcome: Progressing

## 2020-02-11 NOTE — Progress Notes (Signed)
PROGRESS NOTE    [name] Rose  OBS:[id number] DOB: Aug 02, 1950 DOA: 02/03/2020 PCP: Shirline Frees, MD   Chief Complain: Weakness  Brief Narrative: Patient is a 69 year old Caucasian male with past medical history significant for metastatic bladder cancer, currently on chemotherapy, paroxysmal A. fib, hypertension and CKD stage IV.  Patient presented to the emergency department with complaints of generalized weakness and diarrhea for 4 to 5 days preceding admission.  Patient endorsed decreased appetite but no abdominal pain, fevers or chills.  Frequent falls at home was also reported.  When EMS saw patient, he was hypotensive.  Lab work showed severe leukopenia and thrombocytopenia.  Patient was admitted for the management of sepsis related to neutropenia, and started on cefepime.  Oncology team was consulted.  Blood cultures grew Klebsiella pneumonia.  Hospital course remarkable for progressive acute kidney injury, so, nephrology team was consulted.  Due to worsening renal function, patient will be transferred to [facility] for likely hemodialysis.  Cardiology is also following.  Significantly, patient developed rectal bleeding and bleeding via NG tube on 02/10/2020.  Patient was transfused with 1 unit of platelet.  Patient was also started on Protonix drip.  NG tube content is no longer bloody.  Patient continues to report bloody stool, but this may be old blood.  Patient has had problems with abdominal distention, thought to be secondary to ileus.  The ileus is slowly improving.  Oncology team is also assisting in directing patient's care.   Assessment & Plan:   Principal Problem:   Failure to thrive in adult Active Problems:   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 (HCC)   Diarrhea   Chronic a-fib (HCC)   Pancytopenia (HCC)   Fall at home, initial encounter   Atrial fibrillation with RVR ([place])   Pancytopenia: -WBC 0.2, platelets of 30 on  presentation. -Pancytopenia secondary to chemotherapy. -Oncology consulted and following. -Patient was giving Granix. -Neutropenia has resolved.  WBC is 9.9 today. -Platelet count has improved to 35,000 today, after transfusion with 1 unit of platelets. -Patient has been transfused with 2 units of PRBC during this hospitalization. -Hemoglobin today is 9.5 g/dL.  Severe sepsis secondary to gram-negative bacteremia (Klebsiella pneumonia): -Blood cultures grew Klebsiella pneumoniae.   -Patient is on IV cefazolin.   -Plan is to treat patient for 2 weeks, total.  AKI on CKD stage IV: -Baseline creatinine around 2.5. -Serum creatinine has continued to -Serum creatinine is 8.26 today, with BUN of 99. -Patient will be transferred to [facility] for hemodialysis. -Nephrology input is highly appreciated.    Permanent  A. fib/A. fib with RVR: -At home, patient was on Cardizem, Toprol on Eliquis.   -Eliquis has been on hold due to worsening thrombocytopenia. -Tikosyn also held because of worsening kidney function.   -Cardiology team is directing care.  Diarrhea/Abd distension: -Most likely associated with recent chemotherapy. -C. difficile negative.    -Diarrhea has resolved.    Failure to thrive/multiple falls: Decreased appetite, falling frequently at home.    PT consulted.     Metastatic bladder cancer: Follows with oncology, [name].  On chemotherapy.  He also underwent tumor resection by urology in the past.  Hypertension:    -Continue to monitor closely. -Manage expectantly.    Abdominal distention/ileus:  -CT abdomen/pelvis without contrast revealed diffuse dilation of small bowel loops throughout the abdomen to distal ileum.   -Questionable ileus versus obstruction.  However, clinical syndrome favors ileus.   -Hypokalemia initially noted. -Ileus is  slowly resolving. -Abdominal distention is also slowly resolving. -Patient still has NG tube to low suction.     -Please repeat KUB in the morning.  DVT prophylaxis:SCD Code Status: Full Family Communication:  Status is: Inpatient  Remains inpatient appropriate because:Inpatient level of care appropriate due to severity of illness   Dispo: The patient is from: Home              Anticipated d/c is to: Home              Anticipated d/c date is: 3-4 days              Patient currently is not medically stable to d/c.     Consultants:  Oncology Nephrology Cardiology  Procedures:None  Antimicrobials:  Anti-infectives (From admission, onward)   Start     Dose/Rate Route Frequency Ordered Stop   02/12/20 0900  ceFAZolin (ANCEF) IVPB 1 g/50 mL premix        1 g 100 mL/hr over 30 Minutes Intravenous Every 24 hours 02/11/20 1035     02/06/20 1200  ceFAZolin (ANCEF) IVPB 2g/100 mL premix  Status:  Discontinued        2 g 200 mL/hr over 30 Minutes Intravenous Every 12 hours 02/06/20 1103 02/11/20 1035   02/05/20 0600  ceFEPIme (MAXIPIME) 2 g in sodium chloride 0.9 % 100 mL IVPB  Status:  Discontinued        2 g 200 mL/hr over 30 Minutes Intravenous Every 24 hours 02/04/20 0719 02/04/20 1150   02/04/20 1400  cefTRIAXone (ROCEPHIN) 2 g in sodium chloride 0.9 % 100 mL IVPB  Status:  Discontinued        2 g 200 mL/hr over 30 Minutes Intravenous Every 24 hours 02/04/20 1150 02/06/20 1103   02/03/20 1830  ceFEPIme (MAXIPIME) 2 g in sodium chloride 0.9 % 100 mL IVPB  Status:  Discontinued        2 g 200 mL/hr over 30 Minutes Intravenous Every 12 hours 02/03/20 1825 02/04/20 0719      Subjective: -Bloody stool, but may be old blood. -Bloody drainage from the NG tube is cleared. -Abdominal distention is improving. -Patient has been having bowel movements.   Objective: Vitals:   02/10/20 1435 02/10/20 1627 02/10/20 2012 02/11/20 0409  BP: 127/75 130/81 133/75 114/66  Pulse: 88 93 86 84  Resp: $Remo'18 16  18  'ccrIG$ Temp: 98.1 F (36.7 C) 98.1 F (36.7 C) 97.8 F (36.6 C) 98 F (36.7 C)  TempSrc:  Oral Oral Oral Oral  SpO2: 97% 96% 99% 92%  Weight:    121.7 kg  Height:        Intake/Output Summary (Last 24 hours) at 02/11/2020 1544 Last data filed at 02/11/2020 1400 Gross per 24 hour  Intake 4092.08 ml  Output 2275 ml  Net 1817.08 ml   Filed Weights   02/09/20 1227 02/10/20 0423 02/11/20 0409  Weight: 122.2 kg 122.2 kg 121.7 kg    Examination:   General exam: Weak, acutely ill looking.  Obese Respiratory system: Decreased air entry. Cardiovascular system: Irregularly irregular rhythm.  JVD is difficult to assess.  Neck is supple. Gastrointestinal system: Abdomen is obese, soft and nontender.  Decreased bowel sounds.  Organs are difficult to assess.   Central nervous system: Alert and oriented.  Patient moves all extremities. Extremities: No leg edema.  Data Reviewed: I have personally reviewed following labs and imaging studies  CBC: Recent Labs  Lab 02/08/20 0411 02/09/20 0405 02/10/20  6286 02/10/20 1820 02/11/20 0326  WBC 2.4* 4.4 8.4 9.6 9.9  NEUTROABS 1.0* 2.7 5.5 7.3 7.0  HGB 10.0* 9.1* 9.5* 9.2* 8.1*  HCT 29.5* 25.9* 27.0* 27.2* 24.2*  MCV 98.3 95.2 96.1 97.1 98.8  PLT 22* 21* 25* 34* 35*   Basic Metabolic Panel: Recent Labs  Lab 02/05/20 0500 02/06/20 0515 02/07/20 0342 02/08/20 0411 02/09/20 0405 02/10/20 0414 02/11/20 0326  NA 131*   < > 132* 134* 137 140  145 142  K 3.1*   < > 4.0 3.0* 2.4* 2.8*  2.8* 3.5  CL 101   < > 102 104 98 97*  98 99  CO2 19*   < > 17* 16* $Remov'23 25  26 23  'gIXWSS$ GLUCOSE 174*   < > 175* 125* 165* 115*  113* 126*  BUN 55*   < > 69* 83* 99* 99*  85* 104*  CREATININE 3.55*   < > 4.75* 5.82* 6.82* 8.26*  8.13* 9.21*  CALCIUM 7.8*   < > 8.2* 8.0* 7.7* 7.4*  8.1* 7.2*  MG 1.7  --  2.3  --   --  2.3  --   PHOS  --   --   --   --   --  5.0*  --    < > = values in this interval not displayed.   GFR: Estimated Creatinine Clearance: 10.6 mL/min (A) (by C-G formula based on SCr of 9.21 mg/dL (H)). Liver Function  Tests: Recent Labs  Lab 02/08/20 0411 02/09/20 0405 02/10/20 0414 02/11/20 0326  AST 14* $Remov'17 20 20  'srwWXL$ ALT 9 '5 5 5  '$ ALKPHOS 44 45 44 47  BILITOT 0.7 0.7 0.7 1.1  PROT 4.9* 4.6* 4.5* 4.3*  ALBUMIN 2.3* 2.1* 2.1*  2.3* 2.2*   No results for input(s): LIPASE, AMYLASE in the last 168 hours. No results for input(s): AMMONIA in the last 168 hours. Coagulation Profile: No results for input(s): INR, PROTIME in the last 168 hours. Cardiac Enzymes: No results for input(s): CKTOTAL, CKMB, CKMBINDEX, TROPONINI in the last 168 hours. BNP (last 3 results) No results for input(s): PROBNP in the last 8760 hours. HbA1C: No results for input(s): HGBA1C in the last 72 hours. CBG: Recent Labs  Lab 02/08/20 0738 02/08/20 1125 02/08/20 1632 02/08/20 2106 02/09/20 0716  GLUCAP 117* 116* 135* 122* 171*   Lipid Profile: No results for input(s): CHOL, HDL, LDLCALC, TRIG, CHOLHDL, LDLDIRECT in the last 72 hours. Thyroid Function Tests: No results for input(s): TSH, T4TOTAL, FREET4, T3FREE, THYROIDAB in the last 72 hours. Anemia Panel: No results for input(s): VITAMINB12, FOLATE, FERRITIN, TIBC, IRON, RETICCTPCT in the last 72 hours. Sepsis Labs: No results for input(s): PROCALCITON, LATICACIDVEN in the last 168 hours.  Recent Results (from the past 240 hour(s))  Respiratory Panel by RT PCR (Flu A&B, Covid) - Nasopharyngeal Swab     Status: None   Collection Time: 02/03/20 11:28 AM   Specimen: Nasopharyngeal Swab  Result Value Ref Range Status   SARS Coronavirus 2 by RT PCR NEGATIVE NEGATIVE Final    Comment: (NOTE) SARS-CoV-2 target nucleic acids are NOT DETECTED.  The SARS-CoV-2 RNA is generally detectable in upper respiratoy specimens during the acute phase of infection. The lowest concentration of SARS-CoV-2 viral copies this assay can detect is 131 copies/mL. A negative result does not preclude SARS-Cov-2 infection and should not be used as the sole basis for treatment or other  patient management decisions. A negative result may occur with  improper specimen  collection/handling, submission of specimen other than nasopharyngeal swab, presence of viral mutation(s) within the areas targeted by this assay, and inadequate number of viral copies (<131 copies/mL). A negative result must be combined with clinical observations, patient history, and epidemiological information. The expected result is Negative.  Fact Sheet for Patients:  PinkCheek.be  Fact Sheet for Healthcare Providers:  GravelBags.it  This test is no t yet approved or cleared by the [place] FDA and  has been authorized for detection and/or diagnosis of SARS-CoV-2 by FDA under an Emergency Use Authorization (EUA). This EUA will remain  in effect (meaning this test can be used) for the duration of the COVID-19 declaration under Section 564(b)(1) of the Act, 21 U.S.C. section 360bbb-3(b)(1), unless the authorization is terminated or revoked sooner.     Influenza A by PCR NEGATIVE NEGATIVE Final   Influenza B by PCR NEGATIVE NEGATIVE Final    Comment: (NOTE) The Xpert Xpress SARS-CoV-2/FLU/RSV assay is intended as an aid in  the diagnosis of influenza from Nasopharyngeal swab specimens and  should not be used as a sole basis for treatment. Nasal washings and  aspirates are unacceptable for Xpert Xpress SARS-CoV-2/FLU/RSV  testing.  Fact Sheet for Patients: PinkCheek.be  Fact Sheet for Healthcare Providers: GravelBags.it  This test is not yet approved or cleared by the [place] FDA and  has been authorized for detection and/or diagnosis of SARS-CoV-2 by  FDA under an Emergency Use Authorization (EUA). This EUA will remain  in effect (meaning this test can be used) for the duration of the  Covid-19 declaration under Section 564(b)(1) of the Act, 21  U.S.C. section  360bbb-3(b)(1), unless the authorization is  terminated or revoked. Performed at [facility], [place] [address] [place], [place] [address]   Culture, blood (x 2)     Status: Abnormal   Collection Time: 02/03/20  6:50 PM   Specimen: BLOOD RIGHT HAND  Result Value Ref Range Status   Specimen Description   Final    BLOOD RIGHT HAND Performed at [facility] [address] [place], [place] [address]    Special Requests   Final    BOTTLES DRAWN AEROBIC ONLY Blood Culture results may not be optimal due to an inadequate volume of blood received in culture bottles Performed at [facility] [address] [place], [place] [address]    Culture  Setup Time   Final    GRAM NEGATIVE RODS AEROBIC BOTTLE ONLY CRITICAL VALUE NOTED.  VALUE IS CONSISTENT WITH PREVIOUSLY REPORTED AND CALLED VALUE.    Culture (A)  Final    KLEBSIELLA PNEUMONIAE SUSCEPTIBILITIES PERFORMED ON PREVIOUS CULTURE WITHIN THE LAST 5 DAYS. Performed at [facility], [place] [address], [place], [place] [address]    Report Status 02/06/2020 FINAL  Final  Culture, blood (x 2)     Status: Abnormal   Collection Time: 02/03/20  6:50 PM   Specimen: BLOOD  Result Value Ref Range Status   Specimen Description   Final    BLOOD RIGHT ANTECUBITAL Performed at [facility], [place] [address] [place],  [address]    Special Requests   Final    BOTTLES DRAWN AEROBIC ONLY Blood Culture results may not be optimal due to an excessive volume of blood received in culture bottles Performed at [facility] [address], [place],  [address]    Culture  Setup Time   Final    GRAM NEGATIVE  RODS AEROBIC BOTTLE ONLY CRITICAL RESULT CALLED TO, READ BACK BY AND VERIFIED WITH: T,GREEN PHARMD $RemoveBefore'@1113'[id number]$  02/04/20 EB Performed at [facility] [address], [place], [place] [address]    Culture KLEBSIELLA PNEUMONIAE (A)  Final   Report Status  02/06/2020 FINAL  Final   Organism ID, Bacteria KLEBSIELLA PNEUMONIAE  Final      Susceptibility   Klebsiella pneumoniae - MIC*    AMPICILLIN >=32 RESISTANT Resistant     CEFAZOLIN <=4 SENSITIVE Sensitive     CEFEPIME <=0.12 SENSITIVE Sensitive     CEFTAZIDIME <=1 SENSITIVE Sensitive     CEFTRIAXONE <=0.25 SENSITIVE Sensitive     CIPROFLOXACIN <=0.25 SENSITIVE Sensitive     GENTAMICIN <=1 SENSITIVE Sensitive     IMIPENEM <=0.25 SENSITIVE Sensitive     TRIMETH/SULFA <=20 SENSITIVE Sensitive     AMPICILLIN/SULBACTAM 4 SENSITIVE Sensitive     PIP/TAZO <=4 SENSITIVE Sensitive     * KLEBSIELLA PNEUMONIAE  Blood Culture ID Panel (Reflexed)     Status: Abnormal   Collection Time: 02/03/20  6:50 PM  Result Value Ref Range Status   Enterococcus faecalis NOT DETECTED NOT DETECTED Final   Enterococcus Faecium NOT DETECTED NOT DETECTED Final   Listeria monocytogenes NOT DETECTED NOT DETECTED Final   Staphylococcus species NOT DETECTED NOT DETECTED Final   Staphylococcus aureus (BCID) NOT DETECTED NOT DETECTED Final   Staphylococcus epidermidis NOT DETECTED NOT DETECTED Final   Staphylococcus lugdunensis NOT DETECTED NOT DETECTED Final   Streptococcus species NOT DETECTED NOT DETECTED Final   Streptococcus agalactiae NOT DETECTED NOT DETECTED Final   Streptococcus pneumoniae NOT DETECTED NOT DETECTED Final   Streptococcus pyogenes NOT DETECTED NOT DETECTED Final   A.calcoaceticus-baumannii NOT DETECTED NOT DETECTED Final   Bacteroides fragilis NOT DETECTED NOT DETECTED Final   Enterobacterales DETECTED (A) NOT DETECTED Final    Comment: Enterobacterales represent a large order of gram negative bacteria, not a single organism. CRITICAL RESULT CALLED TO, READ BACK BY AND VERIFIED WITH: T,GREEN PHARMD $RemoveBefore'@1113'[id number]$  02/04/20 EB    Enterobacter cloacae complex NOT DETECTED NOT DETECTED Final   Escherichia coli NOT DETECTED NOT DETECTED Final   Klebsiella aerogenes NOT DETECTED NOT DETECTED Final    Klebsiella oxytoca NOT DETECTED NOT DETECTED Final   Klebsiella pneumoniae DETECTED (A) NOT DETECTED Final    Comment: CRITICAL RESULT CALLED TO, READ BACK BY AND VERIFIED WITH: T,GREEN PHARMD $RemoveBefore'@1113'[id number]$  02/04/20 EB    Proteus species NOT DETECTED NOT DETECTED Final   Salmonella species NOT DETECTED NOT DETECTED Final   Serratia marcescens NOT DETECTED NOT DETECTED Final   Haemophilus influenzae NOT DETECTED NOT DETECTED Final   Neisseria meningitidis NOT DETECTED NOT DETECTED Final   Pseudomonas aeruginosa NOT DETECTED NOT DETECTED Final   Stenotrophomonas maltophilia NOT DETECTED NOT DETECTED Final   Candida albicans NOT DETECTED NOT DETECTED Final   Candida auris NOT DETECTED NOT DETECTED Final   Candida glabrata NOT DETECTED NOT DETECTED Final   Candida krusei NOT DETECTED NOT DETECTED Final   Candida parapsilosis NOT DETECTED NOT DETECTED Final   Candida tropicalis NOT DETECTED NOT DETECTED Final   Cryptococcus neoformans/gattii NOT DETECTED NOT DETECTED Final   CTX-M ESBL NOT DETECTED NOT DETECTED Final   Carbapenem resistance IMP NOT DETECTED NOT DETECTED Final   Carbapenem resistance KPC NOT DETECTED NOT DETECTED Final   Carbapenem resistance NDM NOT DETECTED NOT DETECTED Final   Carbapenem resist OXA 48 LIKE NOT DETECTED NOT DETECTED Final   Carbapenem resistance VIM NOT DETECTED  NOT DETECTED Final    Comment: Performed at [facility], [place] [address], [place], [place] [address]  C Difficile Quick Screen w PCR reflex     Status: None   Collection Time: 02/04/20  5:07 PM   Specimen: STOOL  Result Value Ref Range Status   C Diff antigen NEGATIVE NEGATIVE Final   C Diff toxin NEGATIVE NEGATIVE Final   C Diff interpretation No C. difficile detected.  Final    Comment: Performed at [facility], [place] [address] [place], [place] [address]  Culture, blood (Routine X 2) w Reflex to ID Panel     Status: None (Preliminary result)   Collection Time: 02/09/20 10:48  AM   Specimen: BLOOD  Result Value Ref Range Status   Specimen Description   Final    BLOOD LEFT ARM Performed at [facility] [address], [place], [place] [address]    Special Requests   Final    BOTTLES DRAWN AEROBIC AND ANAEROBIC Blood Culture adequate volume Performed at [facility] [address], [place], [place] [address]    Culture   Final    NO GROWTH 2 DAYS Performed at [facility] [address] [place], [place] [address]    Report Status PENDING  Incomplete  Culture, blood (Routine X 2) w Reflex to ID Panel     Status: None (Preliminary result)   Collection Time: 02/09/20 10:50 AM   Specimen: BLOOD  Result Value Ref Range Status   Specimen Description   Final    BLOOD LEFT ARM Performed at [facility] [address], [place], [place] [address]    Special Requests   Final    BOTTLES DRAWN AEROBIC AND ANAEROBIC Blood Culture adequate volume Performed at [facility] [address] [place], [place] [address]    Culture   Final    NO GROWTH 2 DAYS Performed at [facility] [address] [place], [place] [address]    Report Status PENDING  Incomplete         Radiology Studies: DG Abd 1 View  Result Date: 02/10/2020 CLINICAL DATA:  Small bowel obstruction. EXAM: ABDOMEN - 1 VIEW COMPARISON:  February 09, 2020 FINDINGS: Interval placement of enteric catheter, with side hole at the expected location of the GE junction. Persistent dilation of small bowel loops with mild wall thickening in the mid abdomen, with maximum diameter of 5 cm. IMPRESSION: 1. Interval placement of enteric catheter, with side hole at the expected location of the GE junction. Advancement is recommended. 2. Persistent small bowel obstruction. Electronically Signed   By: Fidela Salisbury M.D.   On: 02/10/2020 17:03        Scheduled Meds: . sodium chloride   Intravenous Once  .  atorvastatin  40 mg Oral QPM  . Chlorhexidine Gluconate Cloth  6 each Topical Daily  . metoprolol tartrate  5 mg Intravenous Q6H  . oxybutynin  10 mg Oral QHS  . pyridOXINE  200 mg Oral Q breakfast  . saccharomyces boulardii  250 mg Oral BID  . sodium chloride flush  10-40 mL Intracatheter Q12H   Continuous Infusions: . sodium chloride 150 mL/hr at 02/11/20 0902  . [START ON 02/12/2020]  ceFAZolin (ANCEF) IV    . pantoprozole (PROTONIX) infusion 8 mg/hr (02/11/20 0901)     LOS: 8 days    Time spent: 35 mins.   Bonnell Public, MD  [facility] P11/04/2019, 3:44 PM

## 2020-02-11 NOTE — Progress Notes (Signed)
PT Cancellation Note  Patient Details Name: [name] Rose MRN: [id number] DOB: January 26, 1951   Cancelled Treatment:    Reason Eval/Treat Not Completed: Attempted PT tx session-pt declined to participate on today. Pt stated " can I hold off until I get over there [facility])?" Will check back another day.    [facility]  Office: [phone] Pager: [phone]

## 2020-02-11 NOTE — Progress Notes (Signed)
Pt transferred to [facility] via [facility] at this time. Prior to DC, report was called to [name], Therapist, sports. When transport arrived, pt was removed from tele and tele was notified. Accessed port, IV site, and NG tube was left in place. Pt personal belongings were packed and sent with him. Pt stable at time of departure for transfer.

## 2020-02-11 NOTE — Progress Notes (Signed)
Unfortunately, the renal function is worsening.  It sounds like he will have dialysis either today or tomorrow.  His BUN is 104 creatinine 9.2.  I just hate the fact that his renal function is worsening.  His atrial fibrillation seems to be under good control.  Cardiology is following this closely.  His hemoglobin is 8.1.  He may need to be transfused.  However, if he is we will start dialysis, we will hold off.  His platelet count is 35,000.  I think this is adequate if they want to put in a dialysis catheter for him.  He still has the NG tube in.  His abdomen is not as distended.  He does have some bowel sounds.  I guess another abdominal flatplate will be done today.  Hopefully, there will be some improvement in the ileus/small bowel obstruction so that he can have the tube clamped at least.  He has had no obvious fever.  He has had treatment for the Klebsiella bacteremia.  I would like to think that this is resolved.  Follow up blood cultures have been negative.  It sounds like he will be moved over to [facility] today to get dialysis going.  We will continue to follow him over at [facility].  Thankfully, his white cell count is come up quite nicely.  I know that he has gotten fantastic care from all staff on [address].  I appreciate everybody's effort.  [name], MD  Psalm 6:2

## 2020-02-12 ENCOUNTER — Inpatient Hospital Stay (HOSPITAL_COMMUNITY): Payer: PPO

## 2020-02-12 DIAGNOSIS — K567 Ileus, unspecified: Secondary | ICD-10-CM

## 2020-02-12 DIAGNOSIS — R5081 Fever presenting with conditions classified elsewhere: Secondary | ICD-10-CM

## 2020-02-12 DIAGNOSIS — R627 Adult failure to thrive: Secondary | ICD-10-CM | POA: Diagnosis not present

## 2020-02-12 DIAGNOSIS — N170 Acute kidney failure with tubular necrosis: Secondary | ICD-10-CM | POA: Diagnosis not present

## 2020-02-12 DIAGNOSIS — I4891 Unspecified atrial fibrillation: Secondary | ICD-10-CM | POA: Diagnosis not present

## 2020-02-12 DIAGNOSIS — C679 Malignant neoplasm of bladder, unspecified: Secondary | ICD-10-CM | POA: Diagnosis not present

## 2020-02-12 DIAGNOSIS — D709 Neutropenia, unspecified: Secondary | ICD-10-CM

## 2020-02-12 LAB — COMPREHENSIVE METABOLIC PANEL
ALT: 5 U/L (ref 0–44)
AST: 23 U/L (ref 15–41)
Albumin: 2 g/dL — ABNORMAL LOW (ref 3.5–5.0)
Alkaline Phosphatase: 48 U/L (ref 38–126)
Anion gap: 19 — ABNORMAL HIGH (ref 5–15)
BUN: 123 mg/dL — ABNORMAL HIGH (ref 8–23)
CO2: 22 mmol/L (ref 22–32)
Calcium: 7.4 mg/dL — ABNORMAL LOW (ref 8.9–10.3)
Chloride: 104 mmol/L (ref 98–111)
Creatinine, Ser: 9.84 mg/dL — ABNORMAL HIGH (ref 0.61–1.24)
GFR, Estimated: 5 mL/min — ABNORMAL LOW (ref >60)
Glucose, Bld: 95 mg/dL (ref 70–99)
Potassium: 3.3 mmol/L — ABNORMAL LOW (ref 3.5–5.1)
Sodium: 145 mmol/L (ref 135–145)
Total Bilirubin: 0.9 mg/dL (ref 0.3–1.2)
Total Protein: 4.3 g/dL — ABNORMAL LOW (ref 6.5–8.1)

## 2020-02-12 LAB — CBC WITH DIFFERENTIAL/PLATELET
Abs Immature Granulocytes: 0.34 10*3/uL — ABNORMAL HIGH (ref 0.00–0.07)
Basophils Absolute: 0 10*3/uL (ref 0.0–0.1)
Basophils Relative: 0 %
Eosinophils Absolute: 0.4 10*3/uL (ref 0.0–0.5)
Eosinophils Relative: 5 %
HCT: 24 % — ABNORMAL LOW (ref 39.0–52.0)
Hemoglobin: 7.9 g/dL — ABNORMAL LOW (ref 13.0–17.0)
Immature Granulocytes: 5 %
Lymphocytes Relative: 9 %
Lymphs Abs: 0.7 10*3/uL (ref 0.7–4.0)
MCH: 33.2 pg (ref 26.0–34.0)
MCHC: 32.9 g/dL (ref 30.0–36.0)
MCV: 100.8 fL — ABNORMAL HIGH (ref 80.0–100.0)
Monocytes Absolute: 1.1 10*3/uL — ABNORMAL HIGH (ref 0.1–1.0)
Monocytes Relative: 14 %
Neutro Abs: 5.2 10*3/uL (ref 1.7–7.7)
Neutrophils Relative %: 67 %
Platelets: 36 10*3/uL — ABNORMAL LOW (ref 150–400)
RBC: 2.38 MIL/uL — ABNORMAL LOW (ref 4.22–5.81)
RDW: 15.4 % (ref 11.5–15.5)
WBC: 7.6 10*3/uL (ref 4.0–10.5)
nRBC: 0 % (ref 0.0–0.2)

## 2020-02-12 NOTE — Progress Notes (Signed)
[name] Rose is now over at [facility].  Hopefully, he will start dialysis.  His labs were not done yet today.  He feels okay.  He still has an NG tube in.  Hopefully this will be able to come out soon.  He does have some bowel sounds when I listen to his abdomen.  He has had no bleeding.  He has had no problems with his atrial fibrillation.  His rate is very well controlled.  Cardiology is following him closely.  He has had no problems with pain.  He has had no headache.  There is no increased cough or shortness of breath.  His last blood cultures were negative.  I would think that he should be able to get off the antibiotics for his Klebsiella in the blood.  He is not neutropenic.  I think the real issue now is whether or not his kidneys will start working again.  I would like to think that they will do okay.  His kidney function was stable before he got into the hospital.  Hopefully with resolution of the sepsis and bacteremia, and hypotension, his kidneys will start to function again.  I know he will get fantastic care from all the staff up on [address].  [name], MD  Psalm 34:3

## 2020-02-12 NOTE — Progress Notes (Signed)
Progress Note  Patient Name: [name] Rose Date of Encounter: 02/12/2020  [facility] Cardiologist: Pixie Casino, MD   Subjective   Less nausea now at [facility] for dialysis anxious to start has not heard from renal yet if going to be done today   Inpatient Medications    Scheduled Meds: . sodium chloride   Intravenous Once  . atorvastatin  40 mg Oral QPM  . Chlorhexidine Gluconate Cloth  6 each Topical Daily  . metoprolol tartrate  5 mg Intravenous Q6H  . oxybutynin  10 mg Oral QHS  . pyridOXINE  200 mg Oral Q breakfast  . saccharomyces boulardii  250 mg Oral BID  . sodium chloride flush  10-40 mL Intracatheter Q12H   Continuous Infusions: . sodium chloride 200 mL/hr at 02/12/20 0618  .  ceFAZolin (ANCEF) IV    . pantoprozole (PROTONIX) infusion 8 mg/hr (02/12/20 0418)   PRN Meds: acetaminophen **OR** [DISCONTINUED] acetaminophen, chlorproMAZINE, melatonin, ondansetron **OR** ondansetron (ZOFRAN) IV, sodium chloride flush   Vital Signs    Vitals:   02/11/20 2353 02/12/20 0501 02/12/20 0507 02/12/20 0745  BP: (!) 143/101 106/66  119/70  Pulse: 76 71  84  Resp: $Remo'16 16  20  'HLEhu$ Temp: 97.7 F (36.5 C) (!) 97.4 F (36.3 C)  98.5 F (36.9 C)  TempSrc: Oral Oral  Oral  SpO2: 97% 98%  96%  Weight:   118.8 kg   Height:        Intake/Output Summary (Last 24 hours) at 02/12/2020 0829 Last data filed at 02/12/2020 0418 Gross per 24 hour  Intake 3206.05 ml  Output 1200 ml  Net 2006.05 ml   Last 3 Weights 02/12/2020 02/11/2020 02/10/2020  Weight (lbs) 262 lb 268 lb 3.2 oz 269 lb 6.4 oz  Weight (kg) 118.842 kg 121.655 kg 122.2 kg      Telemetry    Atrial fibrillation with rates in the 70's to 80's. PVCs noted, sometimes in bigeminy patterm. - Personally Reviewed  ECG    No new ECG tracing today. - Personally Reviewed  Physical Exam   GEN: No acute distress.   Neck: Supple. Cardiac: Irregularly irregular rhythm with normal rate. No murmurs, rubs, or gallops.    Respiratory: Clear to auscultation bilaterally. GI: NG tube in place. Soft, non-.tender, non-distended  MS: No lower extremity edema; No deformity. Neuro:  No focal deficits. Psych: Normal affect  Central catheter below right clavicle   Labs    High Sensitivity Troponin:  No results for input(s): TROPONINIHS in the last 720 hours.    Chemistry Recent Labs  Lab 02/09/20 0405 02/10/20 0414 02/11/20 0326  NA 137 140  145 142  K 2.4* 2.8*  2.8* 3.5  CL 98 97*  98 99  CO2 $Re'23 25  26 23  'bvF$ GLUCOSE 165* 115*  113* 126*  BUN 99* 99*  85* 104*  CREATININE 6.82* 8.26*  8.13* 9.21*  CALCIUM 7.7* 7.4*  8.1* 7.2*  PROT 4.6* 4.5* 4.3*  ALBUMIN 2.1* 2.1*  2.3* 2.2*  AST $Re'17 20 20  'JUL$ ALT '5 5 5  '$ ALKPHOS 45 44 47  BILITOT 0.7 0.7 1.1  GFRNONAA 8* 7*  7* 6*  ANIONGAP 16* 18*  21* 20*     Hematology Recent Labs  Lab 02/10/20 0414 02/10/20 1820 02/11/20 0326  WBC 8.4 9.6 9.9  RBC 2.81* 2.80* 2.45*  HGB 9.5* 9.2* 8.1*  HCT 27.0* 27.2* 24.2*  MCV 96.1 97.1 98.8  MCH 33.8 32.9 33.1  MCHC 35.2 33.8 33.5  RDW 14.9 15.0 15.0  PLT 25* 34* 35*    BNPNo results for input(s): BNP, PROBNP in the last 168 hours.   DDimer No results for input(s): DDIMER in the last 168 hours.   Radiology    DG Abd 1 View  Result Date: 02/10/2020 CLINICAL DATA:  Small bowel obstruction. EXAM: ABDOMEN - 1 VIEW COMPARISON:  February 09, 2020 FINDINGS: Interval placement of enteric catheter, with side hole at the expected location of the GE junction. Persistent dilation of small bowel loops with mild wall thickening in the mid abdomen, with maximum diameter of 5 cm. IMPRESSION: 1. Interval placement of enteric catheter, with side hole at the expected location of the GE junction. Advancement is recommended. 2. Persistent small bowel obstruction. Electronically Signed   By: Fidela Salisbury M.D.   On: 02/10/2020 17:03   DG Abd Portable 1V  Result Date: 02/12/2020 CLINICAL DATA:  Ileus. EXAM:  PORTABLE ABDOMEN - 1 VIEW COMPARISON:  02/10/2020 FINDINGS: Gaseous small bowel distention seen on the previous exam has decreased in the interval. A few scattered small bowel loops remain minimally distended up to about 3.4 cm. Gas is visible in the right and left colon. Degenerative changes noted lumbar spine. IMPRESSION: Interval decrease in gaseous bowel distention. Electronically Signed   By: Misty Stanley M.D.   On: 02/12/2020 08:05    Cardiac Studies   Echocardiogram 10/05/2019: Impressions:  1. Normal LV function; mild LVE; trace MR and TR.   2. Left ventricular ejection fraction, by estimation, is 55 to 60%. The  left ventricle has normal function. The left ventricle has no regional  wall motion abnormalities. The left ventricular internal cavity size was  mildly dilated. Left ventricular  diastolic parameters were normal.   3. Right ventricular systolic function is normal. The right ventricular  size is normal. There is mildly elevated pulmonary artery systolic  pressure.   4. The mitral valve is normal in structure. Trivial mitral valve  regurgitation. No evidence of mitral stenosis.   5. The aortic valve has an indeterminant number of cusps. Aortic valve  regurgitation is not visualized. No aortic stenosis is present.   6. The inferior vena cava is normal in size with greater than 50%  respiratory variability, suggesting right atrial pressure of 3 mmHg.  Patient Profile     69 y.o. male with a history of persistent atrial fibrillation on Tikosyn and Eliquis, PVCs,  mitral regurgitation, hypertension, hyperlipidemia, metastatic bladder cancer to intra-abdominal lymph node on chemotherapy, chronic anemia, and CKD stage IV who is being seen today for evaluation of atrial fibrillation with RVR at the request of Dr. Tawanna Solo.  Assessment & Plan    Persistent Atrial Fibrillation -Rate control is fine [place] VASC 2 but anticoagulation being held for Low PLT count and qualitative  platetelet abnormality with uremia increased bleeding risk Oral beta blocker when NG tube out   Thrombocytopenia - from chemo post transfusion still in 35 range   Acute on CKD Stage IV - start dialysis today still making some urine   Metastatic Bladder Cancer - Management per primary team.  Chronic Anemia - Hemoglobin 8.1 today,  - Management per primary team. - erythropoietin   For questions or updates, please contact [place] Please consult [email or web address] for contact info under     Jenkins Rouge MD [facility]

## 2020-02-12 NOTE — Progress Notes (Signed)
Occupational Therapy Treatment Patient Details Name: [name] Rose MRN: [id number] DOB: 05/05/1950 Today's Date: 02/12/2020    History of present illness 69 yo male admitted with FTT, pancytopenia, thrombocytopenia, sepsis, weakness. hx of met prostate ca with bladder mets, Afib, CKD   OT comments  Pt. Was agreeable to OT and performed ADLs sitting EOB. Pt. Was Min guard assist with sit to stand from elevated bed. Pt. Was able to stand at sink for grooming s level. Pt.l will require further OT prior to dc home. Acute ot to follow.   Follow Up Recommendations  Home health OT    Equipment Recommendations  None recommended by OT    Recommendations for Other Services      Precautions / Restrictions Precautions Precautions: Fall Precaution Comments: NPO       Mobility Bed Mobility         Supine to sit: Supervision     General bed mobility comments: pt. needed use of bed rails.   Transfers Overall transfer level: Needs assistance Equipment used: Rolling walker (2 wheeled)   Sit to Stand: Min guard (elevated bed. ) Stand pivot transfers: Min guard       General transfer comment: elevated bed.     Balance                                           ADL either performed or assessed with clinical judgement   ADL Overall ADL's : Needs assistance/impaired Eating/Feeding: NPO   Grooming: Wash/dry face;Supervision/safety               Lower Body Dressing: Moderate assistance;Sit to/from stand   Toilet Transfer: Stand-pivot;BSC;Min guard           Functional mobility during ADLs: Min guard General ADL Comments: Pt sat EOB for ADLs without lob.      Vision   Vision Assessment?: No apparent visual deficits   Perception     Praxis      Cognition Arousal/Alertness: Awake/alert Behavior During Therapy: WFL for tasks assessed/performed Overall Cognitive Status: Within Functional Limits for tasks assessed                                  General Comments: AxO x 3 very pleasant        Exercises     Shoulder Instructions       General Comments      Pertinent Vitals/ Pain       Pain Assessment: No/denies pain  Home Living                                          Prior Functioning/Environment              Frequency  Min 2X/week        Progress Toward Goals  OT Goals(current goals can now be found in the care plan section)  Progress towards OT goals: Progressing toward goals  Acute Rehab OT Goals Patient Stated Goal: regain strength. OT Goal Formulation: With patient Time For Goal Achievement: 02/20/20 Potential to Achieve Goals: Good ADL Goals Pt Will Perform Lower Body Bathing: with set-up;sit to/from stand Pt Will Perform Lower Body Dressing: with set-up;sit to/from stand Pt  Will Transfer to Toilet: with supervision;ambulating;regular height toilet Pt Will Perform Toileting - Clothing Manipulation and hygiene: with supervision;sit to/from stand Additional ADL Goal #1: Patient will perform 10 min functional activity or exercise activity as evidence of improving activity tolerance Additional ADL Goal #2: Patient will stand at sink to perform grooming task as evidence of improving activity tolerance  Plan Discharge plan remains appropriate    Co-evaluation                 AM-PAC OT "6 Clicks" Daily Activity     Outcome Measure   Help from another person eating meals?: None Help from another person taking care of personal grooming?: A Little Help from another person toileting, which includes using toliet, bedpan, or urinal?: A Little Help from another person bathing (including washing, rinsing, drying)?: A Lot Help from another person to put on and taking off regular upper body clothing?: A Little Help from another person to put on and taking off regular lower body clothing?: A Lot 6 Click Score: 17    End of Session Equipment Utilized During  Treatment: Rolling walker  OT Visit Diagnosis: Muscle weakness (generalized) (M62.81)   Activity Tolerance Patient tolerated treatment well   Patient Left in chair;with call bell/phone within reach;with family/visitor present   Nurse Communication  (ok therapy)        Time: 6770-3403 OT Time Calculation (min): 48 min  Charges: OT General Charges $OT Visit: 1 Visit OT Treatments $Self Care/Home Management : 38-52 mins  Reece Packer OT/L    Nyela Cortinas 02/12/2020, 11:14 AM

## 2020-02-12 NOTE — Progress Notes (Signed)
Physical Therapy Treatment Patient Details Name: [name] Rose MRN: [id number] DOB: 1951-02-23 Today's Date: 02/12/2020    History of Present Illness 69 yo male admitted with FTT, pancytopenia, thrombocytopenia, sepsis, weakness. hx of met prostate ca with bladder mets, Afib, CKD    PT Comments    Patient progressing towards physical therapy goals. Patient requires supervision for bed mobility and min guard for transfers and ambulation. Patient ambulated 25' with RW and min guard, cues for maintaining RW proximity. Patient is highly motivated to continue progressing mobilization. Patient continues to be limited by generalized weakness, decreased activity tolerance, and impaired balance. Continue to recommend HHPT at this time to maximize functional mobility and independence.     Follow Up Recommendations  Home health PT;Supervision/Assistance - 24 hour     Equipment Recommendations  None recommended by PT    Recommendations for Other Services       Precautions / Restrictions Precautions Precautions: Fall    Mobility  Bed Mobility Overal bed mobility: Needs Assistance Bed Mobility: Supine to Sit;Sit to Supine     Supine to sit: Supervision Sit to supine: Supervision      Transfers Overall transfer level: Needs assistance Equipment used: Rolling Josph [place] (2 wheeled) Transfers: Sit to/from Stand Sit to Stand: Min guard         General transfer comment: Low bed  Ambulation/Gait Ambulation/Gait assistance: Min guard Gait Distance (Feet): 25 Feet Assistive device: Rolling Nashalie Sallis (2 wheeled) Gait Pattern/deviations: Step-through pattern;Decreased stride length Gait velocity: decreased       Stairs             Wheelchair Mobility    Modified Rankin (Stroke Patients Only)       Balance Overall balance assessment: Needs assistance Sitting-balance support: No upper extremity supported;Feet supported Sitting balance-Leahy Scale: Good     Standing  balance support: Bilateral upper extremity supported;During functional activity Standing balance-Leahy Scale: Fair                              Cognition Arousal/Alertness: Awake/alert Behavior During Therapy: WFL for tasks assessed/performed Overall Cognitive Status: Within Functional Limits for tasks assessed                                        Exercises      General Comments        Pertinent Vitals/Pain Pain Assessment: No/denies pain    Home Living                      Prior Function            PT Goals (current goals can now be found in the care plan section) Acute Rehab PT Goals Patient Stated Goal: regain strength. PT Goal Formulation: With patient Time For Goal Achievement: 02/19/20 Potential to Achieve Goals: Good Progress towards PT goals: Progressing toward goals    Frequency    Min 3X/week      PT Plan Current plan remains appropriate    Co-evaluation              AM-PAC PT "6 Clicks" Mobility   Outcome Measure  Help needed turning from your back to your side while in a flat bed without using bedrails?: A Little Help needed moving from lying on your back to sitting on the side of a  flat bed without using bedrails?: A Little Help needed moving to and from a bed to a chair (including a wheelchair)?: A Little Help needed standing up from a chair using your arms (e.g., wheelchair or bedside chair)?: A Little Help needed to walk in hospital room?: A Little Help needed climbing 3-5 steps with a railing? : A Lot 6 Click Score: 17    End of Session Equipment Utilized During Treatment: Gait belt Activity Tolerance: Patient tolerated treatment well Patient left: in bed;with call bell/phone within reach Nurse Communication: Mobility status PT Visit Diagnosis: Muscle weakness (generalized) (M62.81);History of falling (Z91.81);Repeated falls (R29.6)     Time: 2703-5009 PT Time Calculation (min) (ACUTE  ONLY): 24 min  Charges:  $Therapeutic Activity: 23-37 mins                     Perrin Maltese, PT, DPT Acute Rehabilitation Services Pager [phone] Office [phone]    [name] Rose 02/12/2020, 3:03 PM

## 2020-02-12 NOTE — Progress Notes (Addendum)
   02/12/20 0519  Provider Notification  Provider Name/Title Dr. Tonie Griffith  Date Provider Notified 02/12/20  Time Provider Notified [phone]  Notification Type Page  Notification Reason Other (Comment) (106/66)  Response Other (Comment) (waiting )    Update: To Hold lopressor $RemoveBefore'5mg'[id number]$  IV per DR Tonie Griffith

## 2020-02-12 NOTE — Progress Notes (Signed)
PROGRESS NOTE    [name] Rose   SUO:[id number]  DOB: [birth year]  DOA: 02/03/2020 PCP: Shirline Frees, MD   Brief Narrative:  [name] Rose Patient is a 69 year old Caucasian male with past medical history significant for metastatic bladder cancer, currently on chemotherapy, paroxysmal A. fib, hypertension and CKD stage IV.  Patient presented to the emergency department with complaints of generalized weakness and diarrhea for 4 to 5 days preceding admission.  Patient endorsed decreased appetite but no abdominal pain, fevers or chills.  Frequent falls at home was also reported.  When EMS saw patient, he was hypotensive.  Lab work showed severe leukopenia and thrombocytopenia.  Patient was admitted for the management of sepsis related to neutropenia, and started on cefepime.  Oncology team was consulted.  Blood cultures grew Klebsiella pneumonia.  Hospital course remarkable for progressive acute kidney injury, so, nephrology team was consulted.  Due to worsening renal function, patient will be transferred to [facility] for likely hemodialysis.  Cardiology is also following.  Significantly, patient developed rectal bleeding and bleeding via NG tube on 02/10/2020.  Patient was transfused with 1 unit of platelet.  Patient was also started on Protonix drip.  NG tube content is no longer bloody.  Patient continues to report bloody stool, but this may be old blood.  Patient has had problems with abdominal distention, thought to be secondary to ileus.  The ileus is slowly improving.  Oncology team is also assisting in directing patient's care.   Subjective: Has had a liquid BM today. No abdominal pain or nausea. No other complaints.     Assessment & Plan:   Principal Problem:   Acute renal failure with acute tubular necrosis superimposed on stage 3 CKD - in setting of sepsis and bladder cancer with chemo - baseline CR ~ 1.8- 2.2 - possible ATN - Creatinine rising in house and now  9.84 - continues to make urine - appreciate management by renal team   Active Problems: Vomiting and Diarrhea after chemo followed by ileus - Xray improved today- eating ice chips and drinking the melted ice (may account for his NG output yesterday of 250) - passing stool so NG clamped this AM- per RN, he is tolerating this and continues to ask for ice chips-  -  start liquid diet- can remove NG tomorrow if progressing well  Klebsiella pneumonia bacteremia - severe sepsis - antibiotics started on 10/24- - on IV Cefazolin-recommend 10-14 days of treatment - change to oral once able to remove NG    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  - on chemotherapy- follows with Dr Marin Olp  Acute thrombocytopenia - ? Related to gram neg sepsis and chemo - nadir was 21 on 10/30 - I U platelets transfused on 10/26 and 10/31   GI bleed- anemia - bleeding in setting of thrombocytopenia and NG tube - Notes state he had bleeding on 10/31 (in NG and rectally) and this appeared to be improving yestrday - Bleeding has resolved from what I can tell today- NG output is clear and stool did not have blood this AM  - transfused 2 U PRBC on 10/25 - follow Hb which is 7.9 today (may be a little diluted as on 200 cc NS/hr) - have not consulted GI- on Protonix infusion     Atrial fibrillation with RVR  CHA2DS2-VASc Score 2 - Eliquis on hold due to thrombocytopenia - Tikosyn on hold due to renal failure - Metoprolol and Cardizem on hold due  to NG tube/ileus - appreciate cardiology managing - on PRN IV Metoprolol     Time spent in minutes: 40 min DVT prophylaxis: Place and maintain sequential compression device Start: 02/03/20 1506 SCDs Start: 02/03/20 1433  Code Status: Full code Family Communication:  Disposition Plan:  Status is: Inpatient  Remains inpatient appropriate because:AKI and ileus   Dispo: The patient is from: Home              Anticipated d/c is to: TBD               Anticipated d/c date is: > 3 days              Patient currently is not medically stable to d/c.   Consultants:   Nephrology  Cardiology  Oncology Procedures:   NG tube Antimicrobials:  Anti-infectives (From admission, onward)   Start     Dose/Rate Route Frequency Ordered Stop   02/12/20 0900  ceFAZolin (ANCEF) IVPB 1 g/50 mL premix        1 g 100 mL/hr over 30 Minutes Intravenous Every 24 hours 02/11/20 1035     02/06/20 1200  ceFAZolin (ANCEF) IVPB 2g/100 mL premix  Status:  Discontinued        2 g 200 mL/hr over 30 Minutes Intravenous Every 12 hours 02/06/20 1103 02/11/20 1035   02/05/20 0600  ceFEPIme (MAXIPIME) 2 g in sodium chloride 0.9 % 100 mL IVPB  Status:  Discontinued        2 g 200 mL/hr over 30 Minutes Intravenous Every 24 hours 02/04/20 0719 02/04/20 1150   02/04/20 1400  cefTRIAXone (ROCEPHIN) 2 g in sodium chloride 0.9 % 100 mL IVPB  Status:  Discontinued        2 g 200 mL/hr over 30 Minutes Intravenous Every 24 hours 02/04/20 1150 02/06/20 1103   02/03/20 1830  ceFEPIme (MAXIPIME) 2 g in sodium chloride 0.9 % 100 mL IVPB  Status:  Discontinued        2 g 200 mL/hr over 30 Minutes Intravenous Every 12 hours 02/03/20 1825 02/04/20 0719       Objective: Vitals:   02/12/20 0501 02/12/20 0507 02/12/20 0745 02/12/20 1108  BP: 106/66  119/70 119/68  Pulse: 71  84 (!) 108  Resp: $Remo'16  20 20  'fVLqc$ Temp: (!) 97.4 F (36.3 C)  98.5 F (36.9 C) 97.6 F (36.4 C)  TempSrc: Oral  Oral Oral  SpO2: 98%  96% 92%  Weight:  118.8 kg    Height:        Intake/Output Summary (Last 24 hours) at 02/12/2020 1430 Last data filed at 02/12/2020 0418 Gross per 24 hour  Intake 2091.04 ml  Output 1200 ml  Net 891.04 ml   Filed Weights   02/10/20 0423 02/11/20 0409 02/12/20 0507  Weight: 122.2 kg 121.7 kg 118.8 kg    Examination: General exam: Appears comfortable  HEENT: PERRLA, oral mucosa moist, no sclera icterus or thrush Respiratory system: Clear to auscultation.  Respiratory effort normal. Cardiovascular system: S1 & S2 heard, IIRR Gastrointestinal system: Abdomen soft, non-tender, nondistended. Mild slow bowel sounds. Central nervous system: Alert and oriented. No focal neurological deficits. Extremities: No cyanosis, clubbing or edema Skin: No rashes or ulcers Psychiatry:  Mood & affect appropriate.     Data Reviewed: I have personally reviewed following labs and imaging studies  CBC: Recent Labs  Lab 02/09/20 0405 02/10/20 0414 02/10/20 1820 02/11/20 0326 02/12/20 0850  WBC 4.4 8.4 9.6 9.9 7.6  NEUTROABS 2.7 5.5 7.3 7.0 5.2  HGB 9.1* 9.5* 9.2* 8.1* 7.9*  HCT 25.9* 27.0* 27.2* 24.2* 24.0*  MCV 95.2 96.1 97.1 98.8 100.8*  PLT 21* 25* 34* 35* 36*   Basic Metabolic Panel: Recent Labs  Lab 02/07/20 0342 02/07/20 0342 02/08/20 0411 02/09/20 0405 02/10/20 0414 02/11/20 0326 02/12/20 0850  NA 132*   < > 134* 137 140  145 142 145  K 4.0   < > 3.0* 2.4* 2.8*  2.8* 3.5 3.3*  CL 102   < > 104 98 97*  98 99 104  CO2 17*   < > 16* $Rem'23 25  26 23 22  'YyrX$ GLUCOSE 175*   < > 125* 165* 115*  113* 126* 95  BUN 69*   < > 83* 99* 99*  85* 104* 123*  CREATININE 4.75*   < > 5.82* 6.82* 8.26*  8.13* 9.21* 9.84*  CALCIUM 8.2*   < > 8.0* 7.7* 7.4*  8.1* 7.2* 7.4*  MG 2.3  --   --   --  2.3  --   --   PHOS  --   --   --   --  5.0*  --   --    < > = values in this interval not displayed.   GFR: Estimated Creatinine Clearance: 9.8 mL/min (A) (by C-G formula based on SCr of 9.84 mg/dL (H)). Liver Function Tests: Recent Labs  Lab 02/08/20 0411 02/09/20 0405 02/10/20 0414 02/11/20 0326 02/12/20 0850  AST 14* $Remov'17 20 20 23  'uNbdCB$ ALT 9 '5 5 5 5  '$ ALKPHOS 44 45 44 47 48  BILITOT 0.7 0.7 0.7 1.1 0.9  PROT 4.9* 4.6* 4.5* 4.3* 4.3*  ALBUMIN 2.3* 2.1* 2.1*  2.3* 2.2* 2.0*   No results for input(s): LIPASE, AMYLASE in the last 168 hours. No results for input(s): AMMONIA in the last 168 hours. Coagulation Profile: No results for input(s): INR,  PROTIME in the last 168 hours. Cardiac Enzymes: No results for input(s): CKTOTAL, CKMB, CKMBINDEX, TROPONINI in the last 168 hours. BNP (last 3 results) No results for input(s): PROBNP in the last 8760 hours. HbA1C: No results for input(s): HGBA1C in the last 72 hours. CBG: Recent Labs  Lab 02/08/20 0738 02/08/20 1125 02/08/20 1632 02/08/20 2106 02/09/20 0716  GLUCAP 117* 116* 135* 122* 171*   Lipid Profile: No results for input(s): CHOL, HDL, LDLCALC, TRIG, CHOLHDL, LDLDIRECT in the last 72 hours. Thyroid Function Tests: No results for input(s): TSH, T4TOTAL, FREET4, T3FREE, THYROIDAB in the last 72 hours. Anemia Panel: No results for input(s): VITAMINB12, FOLATE, FERRITIN, TIBC, IRON, RETICCTPCT in the last 72 hours. Urine analysis:    Component Value Date/Time   COLORURINE YELLOW 02/03/2020 1252   APPEARANCEUR HAZY (A) 02/03/2020 1252   LABSPEC 1.012 02/03/2020 1252   PHURINE 5.0 02/03/2020 1252   GLUCOSEU >=500 (A) 02/03/2020 1252   HGBUR LARGE (A) 02/03/2020 1252   BILIRUBINUR NEGATIVE 02/03/2020 1252   KETONESUR NEGATIVE 02/03/2020 1252   PROTEINUR 100 (A) 02/03/2020 1252   UROBILINOGEN 0.2 01/31/2010 0624   NITRITE NEGATIVE 02/03/2020 1252   LEUKOCYTESUR NEGATIVE 02/03/2020 1252   Sepsis Labs: $RemoveBefo'@LABRCNTIP'KtfNagdDOXg$ (procalcitonin:4,lacticidven:4) ) Recent Results (from the past 240 hour(s))  Respiratory Panel by RT PCR (Flu A&B, Covid) - Nasopharyngeal Swab     Status: None   Collection Time: 02/03/20 11:28 AM   Specimen: Nasopharyngeal Swab  Result Value Ref Range Status   SARS Coronavirus 2 by RT PCR NEGATIVE NEGATIVE Final    Comment: (  NOTE) SARS-CoV-2 target nucleic acids are NOT DETECTED.  The SARS-CoV-2 RNA is generally detectable in upper respiratoy specimens during the acute phase of infection. The lowest concentration of SARS-CoV-2 viral copies this assay can detect is 131 copies/mL. A negative result does not preclude SARS-Cov-2 infection and should not be  used as the sole basis for treatment or other patient management decisions. A negative result may occur with  improper specimen collection/handling, submission of specimen other than nasopharyngeal swab, presence of viral mutation(s) within the areas targeted by this assay, and inadequate number of viral copies (<131 copies/mL). A negative result must be combined with clinical observations, patient history, and epidemiological information. The expected result is Negative.  Fact Sheet for Patients:  PinkCheek.be  Fact Sheet for Healthcare Providers:  GravelBags.it  This test is no t yet approved or cleared by the [place] FDA and  has been authorized for detection and/or diagnosis of SARS-CoV-2 by FDA under an Emergency Use Authorization (EUA). This EUA will remain  in effect (meaning this test can be used) for the duration of the COVID-19 declaration under Section 564(b)(1) of the Act, 21 U.S.C. section 360bbb-3(b)(1), unless the authorization is terminated or revoked sooner.     Influenza A by PCR NEGATIVE NEGATIVE Final   Influenza B by PCR NEGATIVE NEGATIVE Final    Comment: (NOTE) The Xpert Xpress SARS-CoV-2/FLU/RSV assay is intended as an aid in  the diagnosis of influenza from Nasopharyngeal swab specimens and  should not be used as a sole basis for treatment. Nasal washings and  aspirates are unacceptable for Xpert Xpress SARS-CoV-2/FLU/RSV  testing.  Fact Sheet for Patients: PinkCheek.be  Fact Sheet for Healthcare Providers: GravelBags.it  This test is not yet approved or cleared by the [place] FDA and  has been authorized for detection and/or diagnosis of SARS-CoV-2 by  FDA under an Emergency Use Authorization (EUA). This EUA will remain  in effect (meaning this test can be used) for the duration of the  Covid-19 declaration under Section  564(b)(1) of the Act, 21  U.S.C. section 360bbb-3(b)(1), unless the authorization is  terminated or revoked. Performed at [facility], [place] [address], [place], [place] [address]   Culture, blood (x 2)     Status: Abnormal   Collection Time: 02/03/20  6:50 PM   Specimen: BLOOD RIGHT HAND  Result Value Ref Range Status   Specimen Description   Final    BLOOD RIGHT HAND Performed at [facility] [address] [place], [place] [address]    Special Requests   Final    BOTTLES DRAWN AEROBIC ONLY Blood Culture results may not be optimal due to an inadequate volume of blood received in culture bottles Performed at [facility] [address], [place], [place] [address]    Culture  Setup Time   Final    GRAM NEGATIVE RODS AEROBIC BOTTLE ONLY CRITICAL VALUE NOTED.  VALUE IS CONSISTENT WITH PREVIOUSLY REPORTED AND CALLED VALUE.    Culture (A)  Final    KLEBSIELLA PNEUMONIAE SUSCEPTIBILITIES PERFORMED ON PREVIOUS CULTURE WITHIN THE LAST 5 DAYS. Performed at [facility], [place] [address] [place], [place] [address]    Report Status 02/06/2020 FINAL  Final  Culture, blood (x 2)     Status: Abnormal   Collection Time: 02/03/20  6:50 PM   Specimen: BLOOD  Result Value Ref Range Status   Specimen Description   Final    BLOOD RIGHT ANTECUBITAL Performed  at [facility], [place] [address] [place], [place] [address]    Special Requests   Final    BOTTLES DRAWN AEROBIC ONLY Blood Culture results may not be optimal due to an excessive volume of blood received in culture bottles Performed at [facility] [address], [place], [place] [address]    Culture  Setup Time   Final    GRAM NEGATIVE RODS AEROBIC BOTTLE ONLY CRITICAL RESULT CALLED TO, READ BACK BY AND VERIFIED WITH: T,GREEN PHARMD $RemoveBefore'@1113'[id number]$  02/04/20 EB Performed at [facility] [address] [place],  [address]    Culture  KLEBSIELLA PNEUMONIAE (A)  Final   Report Status 02/06/2020 FINAL  Final   Organism ID, Bacteria KLEBSIELLA PNEUMONIAE  Final      Susceptibility   Klebsiella pneumoniae - MIC*    AMPICILLIN >=32 RESISTANT Resistant     CEFAZOLIN <=4 SENSITIVE Sensitive     CEFEPIME <=0.12 SENSITIVE Sensitive     CEFTAZIDIME <=1 SENSITIVE Sensitive     CEFTRIAXONE <=0.25 SENSITIVE Sensitive     CIPROFLOXACIN <=0.25 SENSITIVE Sensitive     GENTAMICIN <=1 SENSITIVE Sensitive     IMIPENEM <=0.25 SENSITIVE Sensitive     TRIMETH/SULFA <=20 SENSITIVE Sensitive     AMPICILLIN/SULBACTAM 4 SENSITIVE Sensitive     PIP/TAZO <=4 SENSITIVE Sensitive     * KLEBSIELLA PNEUMONIAE  Blood Culture ID Panel (Reflexed)     Status: Abnormal   Collection Time: 02/03/20  6:50 PM  Result Value Ref Range Status   Enterococcus faecalis NOT DETECTED NOT DETECTED Final   Enterococcus Faecium NOT DETECTED NOT DETECTED Final   Listeria monocytogenes NOT DETECTED NOT DETECTED Final   Staphylococcus species NOT DETECTED NOT DETECTED Final   Staphylococcus aureus (BCID) NOT DETECTED NOT DETECTED Final   Staphylococcus epidermidis NOT DETECTED NOT DETECTED Final   Staphylococcus lugdunensis NOT DETECTED NOT DETECTED Final   Streptococcus species NOT DETECTED NOT DETECTED Final   Streptococcus agalactiae NOT DETECTED NOT DETECTED Final   Streptococcus pneumoniae NOT DETECTED NOT DETECTED Final   Streptococcus pyogenes NOT DETECTED NOT DETECTED Final   A.calcoaceticus-baumannii NOT DETECTED NOT DETECTED Final   Bacteroides fragilis NOT DETECTED NOT DETECTED Final   Enterobacterales DETECTED (A) NOT DETECTED Final    Comment: Enterobacterales represent a large order of gram negative bacteria, not a single organism. CRITICAL RESULT CALLED TO, READ BACK BY AND VERIFIED WITH: T,GREEN PHARMD $RemoveBefore'@1113'[id number]$  02/04/20 EB    Enterobacter cloacae complex NOT DETECTED NOT DETECTED Final   Escherichia coli NOT DETECTED NOT DETECTED Final   Klebsiella  aerogenes NOT DETECTED NOT DETECTED Final   Klebsiella oxytoca NOT DETECTED NOT DETECTED Final   Klebsiella pneumoniae DETECTED (A) NOT DETECTED Final    Comment: CRITICAL RESULT CALLED TO, READ BACK BY AND VERIFIED WITH: T,GREEN PHARMD $RemoveBefore'@1113'[id number]$  02/04/20 EB    Proteus species NOT DETECTED NOT DETECTED Final   Salmonella species NOT DETECTED NOT DETECTED Final   Serratia marcescens NOT DETECTED NOT DETECTED Final   Haemophilus influenzae NOT DETECTED NOT DETECTED Final   Neisseria meningitidis NOT DETECTED NOT DETECTED Final   Pseudomonas aeruginosa NOT DETECTED NOT DETECTED Final   Stenotrophomonas maltophilia NOT DETECTED NOT DETECTED Final   Candida albicans NOT DETECTED NOT DETECTED Final   Candida auris NOT DETECTED NOT DETECTED Final   Candida glabrata NOT DETECTED NOT DETECTED Final   Candida krusei NOT DETECTED NOT DETECTED Final   Candida parapsilosis NOT DETECTED NOT DETECTED Final   Candida  tropicalis NOT DETECTED NOT DETECTED Final   Cryptococcus neoformans/gattii NOT DETECTED NOT DETECTED Final   CTX-M ESBL NOT DETECTED NOT DETECTED Final   Carbapenem resistance IMP NOT DETECTED NOT DETECTED Final   Carbapenem resistance KPC NOT DETECTED NOT DETECTED Final   Carbapenem resistance NDM NOT DETECTED NOT DETECTED Final   Carbapenem resist OXA 48 LIKE NOT DETECTED NOT DETECTED Final   Carbapenem resistance VIM NOT DETECTED NOT DETECTED Final    Comment: Performed at [facility], [place] [address], [place], [place] [address]  C Difficile Quick Screen w PCR reflex     Status: None   Collection Time: 02/04/20  5:07 PM   Specimen: STOOL  Result Value Ref Range Status   C Diff antigen NEGATIVE NEGATIVE Final   C Diff toxin NEGATIVE NEGATIVE Final   C Diff interpretation No C. difficile detected.  Final    Comment: Performed at [facility], [place] [address], [place], [place] [address]  Gastrointestinal Panel by PCR , Stool     Status: None   Collection Time:  02/06/20 10:54 AM   Specimen: Stool  Result Value Ref Range Status   Campylobacter species NOT DETECTED NOT DETECTED Final   Plesimonas shigelloides NOT DETECTED NOT DETECTED Final   Salmonella species NOT DETECTED NOT DETECTED Final   Yersinia enterocolitica NOT DETECTED NOT DETECTED Final   Vibrio species NOT DETECTED NOT DETECTED Final   Vibrio cholerae NOT DETECTED NOT DETECTED Final   Enteroaggregative E coli (EAEC) NOT DETECTED NOT DETECTED Final   Enteropathogenic E coli (EPEC) NOT DETECTED NOT DETECTED Final   Enterotoxigenic E coli (ETEC) NOT DETECTED NOT DETECTED Final   Shiga like toxin producing E coli (STEC) NOT DETECTED NOT DETECTED Final   Shigella/Enteroinvasive E coli (EIEC) NOT DETECTED NOT DETECTED Final   Cryptosporidium NOT DETECTED NOT DETECTED Final   Cyclospora cayetanensis NOT DETECTED NOT DETECTED Final   Entamoeba histolytica NOT DETECTED NOT DETECTED Final   Giardia lamblia NOT DETECTED NOT DETECTED Final   Adenovirus F40/41 NOT DETECTED NOT DETECTED Final   Astrovirus NOT DETECTED NOT DETECTED Final   Norovirus GI/GII NOT DETECTED NOT DETECTED Final   Rotavirus A NOT DETECTED NOT DETECTED Final   Sapovirus (I, II, IV, and V) NOT DETECTED NOT DETECTED Final    Comment: Performed at [facility], [place] [place], [place] [address]  Culture, blood (Routine X 2) w Reflex to ID Panel     Status: None (Preliminary result)   Collection Time: 02/09/20 10:48 AM   Specimen: BLOOD  Result Value Ref Range Status   Specimen Description   Final    BLOOD LEFT ARM Performed at [facility], [place] [address], [place], [place] [address]    Special Requests   Final    BOTTLES DRAWN AEROBIC AND ANAEROBIC Blood Culture adequate volume Performed at [facility] [address], [place], [place] [address]    Culture   Final    NO GROWTH 3 DAYS Performed at [facility], [place] [address], [place], [place] [address]     Report Status PENDING  Incomplete  Culture, blood (Routine X 2) w Reflex to ID Panel     Status: None (Preliminary result)   Collection Time: 02/09/20 10:50 AM   Specimen: BLOOD  Result Value Ref Range Status   Specimen Description   Final    BLOOD LEFT ARM Performed at [facility]  [address] [place], [place] [address]    Special Requests   Final    BOTTLES DRAWN AEROBIC AND ANAEROBIC Blood Culture adequate volume Performed at [facility] [address], [place], [place] [address]    Culture   Final    NO GROWTH 3 DAYS Performed at [facility], [place] [address] [place], [place] [address]    Report Status PENDING  Incomplete         Radiology Studies: DG Abd 1 View  Result Date: 02/10/2020 CLINICAL DATA:  Small bowel obstruction. EXAM: ABDOMEN - 1 VIEW COMPARISON:  February 09, 2020 FINDINGS: Interval placement of enteric catheter, with side hole at the expected location of the GE junction. Persistent dilation of small bowel loops with mild wall thickening in the mid abdomen, with maximum diameter of 5 cm. IMPRESSION: 1. Interval placement of enteric catheter, with side hole at the expected location of the GE junction. Advancement is recommended. 2. Persistent small bowel obstruction. Electronically Signed   By: Fidela Salisbury M.D.   On: 02/10/2020 17:03   DG Abd Portable 1V  Result Date: 02/12/2020 CLINICAL DATA:  Ileus. EXAM: PORTABLE ABDOMEN - 1 VIEW COMPARISON:  02/10/2020 FINDINGS: Gaseous small bowel distention seen on the previous exam has decreased in the interval. A few scattered small bowel loops remain minimally distended up to about 3.4 cm. Gas is visible in the right and left colon. Degenerative changes noted lumbar spine. IMPRESSION: Interval decrease in gaseous bowel distention. Electronically Signed   By: Misty Stanley M.D.   On: 02/12/2020 08:05      Scheduled Meds: . sodium chloride   Intravenous Once    . atorvastatin  40 mg Oral QPM  . Chlorhexidine Gluconate Cloth  6 each Topical Daily  . metoprolol tartrate  5 mg Intravenous Q6H  . oxybutynin  10 mg Oral QHS  . pyridOXINE  200 mg Oral Q breakfast  . saccharomyces boulardii  250 mg Oral BID  . sodium chloride flush  10-40 mL Intracatheter Q12H   Continuous Infusions: . sodium chloride 200 mL/hr at 02/12/20 0618  .  ceFAZolin (ANCEF) IV 1 g (02/12/20 0927)  . pantoprozole (PROTONIX) infusion 8 mg/hr (02/12/20 0418)     LOS: 9 days      Debbe Odea, MD [facility] Pager: [email or web address] 02/12/2020, 2:30 PM

## 2020-02-12 NOTE — Progress Notes (Signed)
[facility] Progress Note   69 y.o.year-oldafib, HTN, CKD 4baseline creat is 1.9- 2.6 from 2021and bladder cancer w/ mets on chemoRx presented on 10/24 w/ gen'd weakness, diarrhea for 5 days, no appetite or fevers. BP soft 99/70, WBC 0.21, creat 2.55, plt 30. Seen by ONC, got Granix, getting plts today , sp 2u prbc. Pt blood cx's grew klebs pna and pt is on IVF's and IV abx. Creat on admit 2.5 >>up to 3.5->4.1. Urineoutput trend going the wrong direction. Was on dialysis in June 2021 temporarily as well.  Assessment/ Plan:   1. AoCKD 4 - Baseline creat is 1.9- 2.6 from 2021, eGFR 26- 34 ml/min. Creat 2.5 on admitand has steadily increased despite aggressive isotonic resuscitation.AKI likelydue to sepsis/ hypoperfusion causing ATN.  - renal function continues to worsen due to ATN;  B/Cr up 9.2 > 9.8 today, however pt remains in good shape w/o uremic symptoms. His NG losses are declining, had 1 BM today and UOP is better. Will cont to hold off on acute HD for now.  - w/ NG clamped will lower IVF's 200 > 125 cc/hr for today - Platelets low and would possibly need transfusion if needs HD cath   2. Klebs pna sepsis/ neutropenic sepsis - BP's wnl now, on IV abx 3. Neutropenia/ pancytopenia - per pmd, ONC 4. Atrial fib - home meds are on hold for now given hypotension 5. Metastatic bladder cancer - per pmd  Kelly Splinter, MD 02/12/2020, 3:41 PM      Subjective:   I/O close to even. Had N/V yesterday x 1.  No confusion or jerking.    Objective:   BP 119/68 (BP Location: Right Arm)   Pulse (!) 108   Temp 97.6 F (36.4 C) (Oral)   Resp 20   Ht '6\' 2"'  (1.88 m)   Wt 118.8 kg   SpO2 92%   BMI 33.64 kg/m   Intake/Output Summary (Last 24 hours) at 02/12/2020 1541 Last data filed at 02/12/2020 0418 Gross per 24 hour  Intake 2091.04 ml  Output 1200 ml  Net 891.04 ml   Weight change: -2.812 kg  Physical Exam: Genalert,supine Chest clear bilatto  bases CVirreg irregno MRG Abd soft ntnd no mass or ascites, NGT Exttr edema pretib  Neuro is alert, Ox 3 , nf, no asterixis  Imaging: DG Abd 1 View  Result Date: 02/10/2020 CLINICAL DATA:  Small bowel obstruction. EXAM: ABDOMEN - 1 VIEW COMPARISON:  February 09, 2020 FINDINGS: Interval placement of enteric catheter, with side hole at the expected location of the GE junction. Persistent dilation of small bowel loops with mild wall thickening in the mid abdomen, with maximum diameter of 5 cm. IMPRESSION: 1. Interval placement of enteric catheter, with side hole at the expected location of the GE junction. Advancement is recommended. 2. Persistent small bowel obstruction. Electronically Signed   By: Fidela Salisbury M.D.   On: 02/10/2020 17:03   DG Abd Portable 1V  Result Date: 02/12/2020 CLINICAL DATA:  Ileus. EXAM: PORTABLE ABDOMEN - 1 VIEW COMPARISON:  02/10/2020 FINDINGS: Gaseous small bowel distention seen on the previous exam has decreased in the interval. A few scattered small bowel loops remain minimally distended up to about 3.4 cm. Gas is visible in the right and left colon. Degenerative changes noted lumbar spine. IMPRESSION: Interval decrease in gaseous bowel distention. Electronically Signed   By: Misty Stanley M.D.   On: 02/12/2020 08:05    Labs: BMET Recent Labs  Lab 02/06/20 0515  02/07/20 0342 02/08/20 0411 02/09/20 0405 02/10/20 0414 02/11/20 0326 02/12/20 0850  NA 130* 132* 134* 137 140  145 142 145  K 3.2* 4.0 3.0* 2.4* 2.8*  2.8* 3.5 3.3*  CL 100 102 104 98 97*  98 99 104  CO2 18* 17* 16* '23 25  26 23 22  ' GLUCOSE 155* 175* 125* 165* 115*  113* 126* 95  BUN 58* 69* 83* 99* 99*  85* 104* 123*  CREATININE 4.15* 4.75* 5.82* 6.82* 8.26*  8.13* 9.21* 9.84*  CALCIUM 8.0* 8.2* 8.0* 7.7* 7.4*  8.1* 7.2* 7.4*  PHOS  --   --   --   --  5.0*  --   --    CBC Recent Labs  Lab 02/10/20 0414 02/10/20 1820 02/11/20 0326 02/12/20 0850  WBC 8.4 9.6 9.9 7.6   NEUTROABS 5.5 7.3 7.0 5.2  HGB 9.5* 9.2* 8.1* 7.9*  HCT 27.0* 27.2* 24.2* 24.0*  MCV 96.1 97.1 98.8 100.8*  PLT 25* 34* 35* 36*    Medications:    . sodium chloride   Intravenous Once  . Chlorhexidine Gluconate Cloth  6 each Topical Daily  . metoprolol tartrate  5 mg Intravenous Q6H  . oxybutynin  10 mg Oral QHS  . sodium chloride flush  10-40 mL Intracatheter Q12H

## 2020-02-13 DIAGNOSIS — N183 Chronic kidney disease, stage 3 unspecified: Secondary | ICD-10-CM | POA: Diagnosis not present

## 2020-02-13 DIAGNOSIS — D701 Agranulocytosis secondary to cancer chemotherapy: Secondary | ICD-10-CM | POA: Diagnosis not present

## 2020-02-13 DIAGNOSIS — I4891 Unspecified atrial fibrillation: Secondary | ICD-10-CM | POA: Diagnosis not present

## 2020-02-13 DIAGNOSIS — T451X5A Adverse effect of antineoplastic and immunosuppressive drugs, initial encounter: Secondary | ICD-10-CM

## 2020-02-13 DIAGNOSIS — N1832 Chronic kidney disease, stage 3b: Secondary | ICD-10-CM

## 2020-02-13 DIAGNOSIS — N170 Acute kidney failure with tubular necrosis: Secondary | ICD-10-CM | POA: Diagnosis not present

## 2020-02-13 DIAGNOSIS — N179 Acute kidney failure, unspecified: Secondary | ICD-10-CM | POA: Diagnosis not present

## 2020-02-13 LAB — CBC WITH DIFFERENTIAL/PLATELET
Abs Immature Granulocytes: 0.34 10*3/uL — ABNORMAL HIGH (ref 0.00–0.07)
Basophils Absolute: 0 10*3/uL (ref 0.0–0.1)
Basophils Relative: 1 %
Eosinophils Absolute: 0.4 10*3/uL (ref 0.0–0.5)
Eosinophils Relative: 6 %
HCT: 24.9 % — ABNORMAL LOW (ref 39.0–52.0)
Hemoglobin: 8.2 g/dL — ABNORMAL LOW (ref 13.0–17.0)
Immature Granulocytes: 5 %
Lymphocytes Relative: 8 %
Lymphs Abs: 0.6 10*3/uL — ABNORMAL LOW (ref 0.7–4.0)
MCH: 33.2 pg (ref 26.0–34.0)
MCHC: 32.9 g/dL (ref 30.0–36.0)
MCV: 100.8 fL — ABNORMAL HIGH (ref 80.0–100.0)
Monocytes Absolute: 1.1 10*3/uL — ABNORMAL HIGH (ref 0.1–1.0)
Monocytes Relative: 15 %
Neutro Abs: 5 10*3/uL (ref 1.7–7.7)
Neutrophils Relative %: 65 %
Platelets: 41 10*3/uL — ABNORMAL LOW (ref 150–400)
RBC: 2.47 MIL/uL — ABNORMAL LOW (ref 4.22–5.81)
RDW: 15.2 % (ref 11.5–15.5)
WBC: 7.4 10*3/uL (ref 4.0–10.5)
nRBC: 0 % (ref 0.0–0.2)

## 2020-02-13 LAB — BASIC METABOLIC PANEL
Anion gap: 13 (ref 5–15)
BUN: 105 mg/dL — ABNORMAL HIGH (ref 8–23)
CO2: 20 mmol/L — ABNORMAL LOW (ref 22–32)
Calcium: 6.9 mg/dL — ABNORMAL LOW (ref 8.9–10.3)
Chloride: 105 mmol/L (ref 98–111)
Creatinine, Ser: 8.57 mg/dL — ABNORMAL HIGH (ref 0.61–1.24)
GFR, Estimated: 6 mL/min — ABNORMAL LOW (ref >60)
Glucose, Bld: 156 mg/dL — ABNORMAL HIGH (ref 70–99)
Potassium: 2.8 mmol/L — ABNORMAL LOW (ref 3.5–5.1)
Sodium: 138 mmol/L (ref 135–145)

## 2020-02-13 MED ORDER — POTASSIUM CHLORIDE 20 MEQ PO PACK
40.0000 meq | PACK | Freq: Two times a day (BID) | ORAL | Status: DC
Start: 2020-02-13 — End: 2020-02-13

## 2020-02-13 MED ORDER — POTASSIUM CHLORIDE 20 MEQ PO PACK
40.0000 meq | PACK | ORAL | Status: AC
  Administered 2020-02-13 (×2): 40 meq via ORAL
  Filled 2020-02-13 (×2): qty 2

## 2020-02-13 MED ORDER — POTASSIUM CHLORIDE 20 MEQ PO PACK
40.0000 meq | PACK | Freq: Once | ORAL | Status: AC
  Administered 2020-02-13: 40 meq via ORAL
  Filled 2020-02-13: qty 2

## 2020-02-13 MED ORDER — CEPHALEXIN 250 MG PO CAPS
500.0000 mg | ORAL_CAPSULE | Freq: Two times a day (BID) | ORAL | Status: DC
  Administered 2020-02-13 – 2020-02-15 (×5): 500 mg via ORAL
  Filled 2020-02-13 (×5): qty 2

## 2020-02-13 MED ORDER — METOPROLOL TARTRATE 25 MG PO TABS
25.0000 mg | ORAL_TABLET | Freq: Two times a day (BID) | ORAL | Status: DC
  Administered 2020-02-13 (×2): 25 mg via ORAL
  Filled 2020-02-13 (×2): qty 1

## 2020-02-13 MED ORDER — PANTOPRAZOLE SODIUM 40 MG IV SOLR
40.0000 mg | Freq: Two times a day (BID) | INTRAVENOUS | Status: DC
  Administered 2020-02-13 – 2020-02-14 (×3): 40 mg via INTRAVENOUS
  Filled 2020-02-13 (×3): qty 40

## 2020-02-13 NOTE — Progress Notes (Addendum)
Progress Note  Patient Name: [name] Rose Date of Encounter: 02/13/2020  [facility] Cardiologist: Pixie Casino, MD   Subjective   No acute overnight events. Today's labs pending but creatinine was continuing to rise yesterday. Still making urine. Patient still in atrial fibrillation although unaware. No palpitations. Rates spike with minimal activity. No chest pain. Some shortness of breath with activity which patient attributes to deconditioning. Still having a lot of diarrhea.  Inpatient Medications    Scheduled Meds: . sodium chloride   Intravenous Once  . Chlorhexidine Gluconate Cloth  6 each Topical Daily  . metoprolol tartrate  5 mg Intravenous Q6H  . oxybutynin  10 mg Oral QHS  . pantoprazole (PROTONIX) IV  40 mg Intravenous Q12H  . sodium chloride flush  10-40 mL Intracatheter Q12H   Continuous Infusions: . sodium chloride 150 mL/hr at 02/12/20 1559  .  ceFAZolin (ANCEF) IV 1 g (02/12/20 0927)   PRN Meds: acetaminophen **OR** [DISCONTINUED] acetaminophen, melatonin, ondansetron **OR** ondansetron (ZOFRAN) IV, sodium chloride flush   Vital Signs    Vitals:   02/12/20 1705 02/12/20 1929 02/13/20 0011 02/13/20 0428  BP: 122/72 122/75  118/79  Pulse: 75 83  (!) 102  Resp: $Remo'16 18  18  'sFlYC$ Temp: (!) 97.5 F (36.4 C) 98 F (36.7 C)  (!) 97.5 F (36.4 C)  TempSrc: Oral Oral  Oral  SpO2:  100%  99%  Weight:   124.3 kg   Height:        Intake/Output Summary (Last 24 hours) at 02/13/2020 0746 Last data filed at 02/13/2020 0600 Gross per 24 hour  Intake 1650 ml  Output 1000 ml  Net 650 ml   Last 3 Weights 02/13/2020 02/12/2020 02/11/2020  Weight (lbs) 274 lb 262 lb 268 lb 3.2 oz  Weight (kg) 124.286 kg 118.842 kg 121.655 kg      Telemetry    Atrial fibrillation with rates mostly in the 80's to 110's but as high as the 150's to 160's at times. - Personally Reviewed  ECG    No new ECG tracing today. - Personally Reviewed  Physical Exam   GEN: No acute  distress.   Neck: Supple. Cardiac: Tachycardia with irregularly irregular rhythm. No murmurs, rubs, or gallops.  Respiratory: Clear to auscultation bilaterally. GI: Soft, non-tender, non-distended  MS: 1+ pitting edema of bilateral lower extremities. No deformity. Skin: Warm and dry. Neuro:  No focal deficits. Psych: Normal affect. Responds appropriately.  Labs    High Sensitivity Troponin:  No results for input(s): TROPONINIHS in the last 720 hours.    Chemistry Recent Labs  Lab 02/10/20 0414 02/11/20 0326 02/12/20 0850  NA 140  145 142 145  K 2.8*  2.8* 3.5 3.3*  CL 97*  98 99 104  CO2 $Re'25  26 23 22  'JOb$ GLUCOSE 115*  113* 126* 95  BUN 99*  85* 104* 123*  CREATININE 8.26*  8.13* 9.21* 9.84*  CALCIUM 7.4*  8.1* 7.2* 7.4*  PROT 4.5* 4.3* 4.3*  ALBUMIN 2.1*  2.3* 2.2* 2.0*  AST $Re'20 20 23  'ghy$ ALT $R'5 5 5  'WF$ ALKPHOS 44 47 48  BILITOT 0.7 1.1 0.9  GFRNONAA 7*  7* 6* 5*  ANIONGAP 18*  21* 20* 19*     Hematology Recent Labs  Lab 02/10/20 1820 02/11/20 0326 02/12/20 0850  WBC 9.6 9.9 7.6  RBC 2.80* 2.45* 2.38*  HGB 9.2* 8.1* 7.9*  HCT 27.2* 24.2* 24.0*  MCV 97.1 98.8 100.8[facility]  32.9 33.1 33.2  MCHC 33.8 33.5 32.9  RDW 15.0 15.0 15.4  PLT 34* 35* 36*    BNPNo results for input(s): BNP, PROBNP in the last 168 hours.   DDimer No results for input(s): DDIMER in the last 168 hours.   Radiology    DG Abd Portable 1V  Result Date: 02/12/2020 CLINICAL DATA:  Ileus. EXAM: PORTABLE ABDOMEN - 1 VIEW COMPARISON:  02/10/2020 FINDINGS: Gaseous small bowel distention seen on the previous exam has decreased in the interval. A few scattered small bowel loops remain minimally distended up to about 3.4 cm. Gas is visible in the right and left colon. Degenerative changes noted lumbar spine. IMPRESSION: Interval decrease in gaseous bowel distention. Electronically Signed   By: Misty Stanley M.D.   On: 02/12/2020 08:05    Cardiac Studies   Echocardiogram  10/05/2019: Impressions:  1. Normal LV function; mild LVE; trace MR and TR.   2. Left ventricular ejection fraction, by estimation, is 55 to 60%. The  left ventricle has normal function. The left ventricle has no regional  wall motion abnormalities. The left ventricular internal cavity size was  mildly dilated. Left ventricular  diastolic parameters were normal.   3. Right ventricular systolic function is normal. The right ventricular  size is normal. There is mildly elevated pulmonary artery systolic  pressure.   4. The mitral valve is normal in structure. Trivial mitral valve  regurgitation. No evidence of mitral stenosis.   5. The aortic valve has an indeterminant number of cusps. Aortic valve  regurgitation is not visualized. No aortic stenosis is present.   6. The inferior vena cava is normal in size with greater than 50%  respiratory variability, suggesting right atrial pressure of 3 mmHg.  Patient Profile     69 y.o. male with a history of persistent atrial fibrillation on Tikosyn and Eliquis, PVCs,  mitral regurgitation, hypertension, hyperlipidemia, metastatic bladder cancer to intra-abdominal lymph node on chemotherapy, chronic anemia, and CKD stage IV. Patient admitted with sepsis, ileus, and progressive AKI. Cardiology was consulted for evaluation of atrial fibrillation with RVR at the request of Dr. Tawanna Solo.  Assessment & Plan    Persistent Atrial Fibrillation - Patient was in sinus rhythm when seen in A. Fib clinic on 10/18. Unfortunately, there was no EKG on admit and Tikosyn was held due to worsening renal function. - Initially very hypokalemia (likely due to GI loses) but has improved with supplementation. - Rates mostly in the 80's to 110's but as high as 150's to 160's at times. Currently in the 110's but did not getting morning dose of Lopressor. - Potassium 3.3. Goal >4.0. Supplement as needed per primary team. - Magnesium 2.3 on 10/31. Goal >2.0. Supplement as  needed. - Looks like patient is now taking some PO medications. NG tube is going to be removed. Will switch to PO Lopressor $RemoveBefo'25mg'DCosBCFRBVo$  twice daily. Can increase frequency if needed. Can also add PO Cardizem if needed. - CHA2DS2-VASc = 2 (HTN and age). On Eliquis at home. However, this was held on admission due to thrombocytopenia. Platelets 36,000 yesterday. Today's labs pending.   Thrombocytopenia - Secondary to chemo. - Platelets 36,000 yesterday. Today's labs pending.  - S/p transfusion of platelets. - Management per primary team and Oncology.    Acute on CKD Stage IV - Creatinine continues to trend up and was 9.84 yesterday. Today's BMET pending. Baseline 1.9 to 2.6. - He does have some lower extremity edema on exam but otherwise does not appear  volume overloaded. Can try compression stockings. Would management of volume status to nephrology given renal function. - Nephrology consulted. AKI felt to be due to sepsis/hypoperfusion causing ATN. - Will likely need dialysis but has not been started yet.   Otherwise, per primary team: - GI bleeding/anemia - Klebsiella pneumonia bacteremia - Bladder cancer  For questions or updates, please contact [facility] Please consult [email or web address] for contact info under        Signed, Darreld Mclean, PA-C  02/13/2020, 7:46 AM    Patient examined chart reviewed AFib persistent Agree with change to oral lopressor Continue to hold anticoagulation with low PLT qualitative PLT defect from ARF and Hct drifting down to 24 ? Transfuse . I can't see how he will avoid dialysis Nurse has been unavailable to remove NG tube so I removed it myself with no issues   Jenkins Rouge MD [facility]

## 2020-02-13 NOTE — Progress Notes (Signed)
[facility] Progress Note   69 y.o.year-oldafib, HTN, CKD 4baseline creat is 1.9- 2.6 from 2021and bladder cancer w/ mets on chemoRx presented on 10/24 w/ gen'd weakness, diarrhea for 5 days, no appetite or fevers. BP soft 99/70, WBC 0.21, creat 2.55, plt 30. Seen by ONC, got Granix, getting plts today , sp 2u prbc. Pt blood cx's grew klebs pna and pt is on IVF's and IV abx. Creat on admit 2.5 >>up to 3.5->4.1. Urineoutput trend going the wrong direction. Was on dialysis in June 2021 temporarily as well.  Assessment/ Plan:   1. AoCKD 4 - Baseline creat is 1.9- 2.6 from 2021, eGFR 26- 34 ml/min. Creat 2.5 on admitand has steadily increased despite aggressive isotonic resuscitation.AKI likelydue to sepsis/ hypoperfusion causing ATN.  - renal function worsening due to ATN;  B/Cr up 9.2 > 9.8 yest, labs pending today, still no uremic sx/ sx - NG losses resolved, wt's up , some LE edema > dc IVF's  2. Klebs pna neutropenic sepsis - BP's wnl now, on IV abx, wbc better 3. Neutropenia/ pancytopenia - per pmd, ONC 4. Atrial fib - home meds are on hold for now given hypotension 5. Metastatic bladder cancer - per pmd 6. Thrombocytopenia - some mild improvement  Kelly Splinter, MD 02/13/2020, 1:53 PM      Subjective:   May get NG out today. +loose stools, diarrhea.  No confusion or jerking.    Objective:   BP 118/79 (BP Location: Right Arm)   Pulse (!) 102   Temp (!) 97.5 F (36.4 C) (Oral)   Resp 18   Ht '6\' 2"'  (1.88 m)   Wt 124.3 kg   SpO2 99%   BMI 35.18 kg/m   Intake/Output Summary (Last 24 hours) at 02/13/2020 1353 Last data filed at 02/13/2020 0600 Gross per 24 hour  Intake 1650 ml  Output 1000 ml  Net 650 ml   Weight change: 5.443 kg  Physical Exam: Genalert,supine Chest clear bilatto bases CVirreg irregno MRG Abd soft ntnd no mass or ascites, NGT Exttr edema pretib  Neuro is alert, Ox 3 , nf, no asterixis  Imaging: DG Abd Portable  1V  Result Date: 02/12/2020 CLINICAL DATA:  Ileus. EXAM: PORTABLE ABDOMEN - 1 VIEW COMPARISON:  02/10/2020 FINDINGS: Gaseous small bowel distention seen on the previous exam has decreased in the interval. A few scattered small bowel loops remain minimally distended up to about 3.4 cm. Gas is visible in the right and left colon. Degenerative changes noted lumbar spine. IMPRESSION: Interval decrease in gaseous bowel distention. Electronically Signed   By: Misty Stanley M.D.   On: 02/12/2020 08:05    Labs: BMET Recent Labs  Lab 02/07/20 0342 02/08/20 0411 02/09/20 0405 02/10/20 0414 02/11/20 0326 02/12/20 0850  NA 132* 134* 137 140  145 142 145  K 4.0 3.0* 2.4* 2.8*  2.8* 3.5 3.3*  CL 102 104 98 97*  98 99 104  CO2 17* 16* '23 25  26 23 22  ' GLUCOSE 175* 125* 165* 115*  113* 126* 95  BUN 69* 83* 99* 99*  85* 104* 123*  CREATININE 4.75* 5.82* 6.82* 8.26*  8.13* 9.21* 9.84*  CALCIUM 8.2* 8.0* 7.7* 7.4*  8.1* 7.2* 7.4*  PHOS  --   --   --  5.0*  --   --    CBC Recent Labs  Lab 02/10/20 1820 02/11/20 0326 02/12/20 0850 02/13/20 0802  WBC 9.6 9.9 7.6 7.4  NEUTROABS 7.3 7.0 5.2 5.0  HGB 9.2* 8.1* 7.9* 8.2*  HCT 27.2* 24.2* 24.0* 24.9*  MCV 97.1 98.8 100.8* 100.8*  PLT 34* 35* 36* 41*    Medications:    . sodium chloride   Intravenous Once  . cephALEXin  500 mg Oral Q12H  . Chlorhexidine Gluconate Cloth  6 each Topical Daily  . metoprolol tartrate  25 mg Oral BID  . oxybutynin  10 mg Oral QHS  . pantoprazole (PROTONIX) IV  40 mg Intravenous Q12H  . sodium chloride flush  10-40 mL Intracatheter Q12H

## 2020-02-13 NOTE — Progress Notes (Signed)
[facility] PROGRESS NOTE    Progress Note  [name] Rose  RWE:[id number] DOB: [birth year] DOA: 02/03/2020 PCP: Shirline Frees, MD     Brief Narrative:   [name] Rose is an 69 y.o. male past medical history significant for metastatic bladder cancer currently on chemotherapy, paroxysmal atrial fibrillation, chronic kidney disease stage 3 comes into the ED for generalized weakness and diarrhea that started 4 days prior to admission.  On admission he was found to be septic with neutropenia with started on antibiotics oncology was consulted blood cultures grew Klebsiella.  Hospital course complicated by acute kidney injury and rectal and NG tube bleeding on 02/10/2020 he was transfused 1 unit of red blood cells, was started on IV Protonix, subsequently developed ileus which is slowly improving.  Assessment/Plan:   Acute renal failure with acute tubular necrosis superimposed on stage 3 chronic kidney disease ([place]): In the setting of sepsis and bladder cancer on chemo. Leading to possible ATN. Creatinine on admission around 2.5 it peaked now around 9.8, basic metabolic panel is pending this morning. Appreciate nephrology assistance.  Further management per renal. Having great urine output.  Continue IV fluids at a lower rate as he is now taking orals. Patient will therapy evaluated the patient and recommended home health PT.  Vomiting and diarrhea after chemotherapy now resolved, with ileus: X-ray was improved on 02/12/2020. Patient is tolerating ice chips. Patient is passing gas tolerating his diet discontinue NG tube.  Continue on clear liquid diet.  Klebsiella bacteremia/severe sepsis: He was started empirically on IV cefazolin on 02/03/2020 will need 10 to 14 days.  Continue to oral Keflex.  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 ([place]) Appreciate oncology's assistance.  Acute thrombocytopenia: Related to chemotherapy in the setting of sepsis. That is  post 1 unit of packed platelets on 10/26 and 02/10/2020.  His platelets now are trending up.  Next  GI bleed/chemotherapy-induced anemia: In the setting of thrombocytopenia and NG tube. Bleeding has resolved output is clear through the NG today. Status post 2 units of packed red blood cells on 02/04/2020. Change Protonix to IV twice daily. CBC is pending this morning.  A. fib with RVR: With a chads Vascor greater than 2. Eliquis was held due to GI bleed and thrombocytopenia. Tikosyn has also been on hold due to acute kidney injury. Continue metoprolol and Cardizem.  Appreciate nephrology's assistance.   DVT prophylaxis: scd Family Communication:none Status is: Inpatient  Remains inpatient appropriate because:Hemodynamically unstable   Dispo: The patient is from: Home              Anticipated d/c is to: Home              Anticipated d/c date is: 3 days              Patient currently is not medically stable to d/c.        Code Status:     Code Status Orders  (From admission, onward)         Start     Ordered   02/03/20 1433  Full code  Continuous        02/03/20 1432        Code Status History    Date Active Date Inactive Code Status Order ID Comments User Context   09/25/2019 1405 09/28/2019 2249 Full Code [id number]  Sherran Needs, NP Inpatient   09/08/2018 1209 10/14/2018 1649 Full Code [id number]  Mariel Aloe, MD Inpatient   06/21/2018  1713 06/29/2018 1559 Full Code [id number]  Georgette Shell, MD ED   12/19/2017 1726 12/23/2017 1453 DNR [id number]  Alma Friendly, MD Inpatient   10/17/2016 1231 10/21/2016 1911 Full Code [id number]  Lavina Hamman, MD Inpatient   09/20/2016 1009 09/21/2016 1229 Full Code [id number]  Franchot Gallo, MD Inpatient   03/03/2015 1929 03/05/2015 1428 Full Code [id number]  Gary Fleet, PA-C Inpatient   Advance Care Planning Activity    Advance Directive Documentation     Most Recent Value  Type of Advance Directive  Healthcare Power of Attorney  Pre-existing out of facility DNR order (yellow form or pink MOST form) --  "MOST" Form in Place? --        IV Access:    Peripheral IV   Procedures and diagnostic studies:   DG Abd Portable 1V  Result Date: 02/12/2020 CLINICAL DATA:  Ileus. EXAM: PORTABLE ABDOMEN - 1 VIEW COMPARISON:  02/10/2020 FINDINGS: Gaseous small bowel distention seen on the previous exam has decreased in the interval. A few scattered small bowel loops remain minimally distended up to about 3.4 cm. Gas is visible in the right and left colon. Degenerative changes noted lumbar spine. IMPRESSION: Interval decrease in gaseous bowel distention. Electronically Signed   By: Misty Stanley M.D.   On: 02/12/2020 08:05     Medical Consultants:    None.  Anti-Infectives:   kelfex  Subjective:    [name] Rose relates he feels better today continues to have watery diarrhea, tolerating his clear liquid diet.  Objective:    Vitals:   02/12/20 1705 02/12/20 1929 02/13/20 0011 02/13/20 0428  BP: 122/72 122/75  118/79  Pulse: 75 83  (!) 102  Resp: $Remo'16 18  18  'fOHLy$ Temp: (!) 97.5 F (36.4 C) 98 F (36.7 C)  (!) 97.5 F (36.4 C)  TempSrc: Oral Oral  Oral  SpO2:  100%  99%  Weight:   124.3 kg   Height:       SpO2: 99 %   Intake/Output Summary (Last 24 hours) at 02/13/2020 0730 Last data filed at 02/13/2020 0600 Gross per 24 hour  Intake 1650 ml  Output 1000 ml  Net 650 ml   Filed Weights   02/11/20 0409 02/12/20 0507 02/13/20 0011  Weight: 121.7 kg 118.8 kg 124.3 kg    Exam: General exam: In no acute distress. Respiratory system: Good air movement and clear to auscultation. Cardiovascular system: S1 & S2 heard, RRR. No JVD.  Gastrointestinal system: Abdomen is nondistended, soft and nontender.  Extremities: No pedal edema. Skin: No rashes, lesions or ulcers Psychiatry: Judgement and insight appear normal. Mood & affect appropriate.    Data Reviewed:     Labs: Basic Metabolic Panel: Recent Labs  Lab 02/07/20 0342 02/07/20 0342 02/08/20 0411 02/08/20 0411 02/09/20 0405 02/09/20 0405 02/10/20 7078 02/10/20 0414 02/11/20 0326 02/12/20 0850  NA 132*   < > 134*  --  137  --  140  145  --  142 145  K 4.0   < > 3.0*   < > 2.4*   < > 2.8*  2.8*   < > 3.5 3.3*  CL 102   < > 104  --  98  --  97*  98  --  99 104  CO2 17*   < > 16*  --  23  --  25  26  --  23 22  GLUCOSE 175*   < > 125*  --  165*  --  115*  113*  --  126* 95  BUN 69*   < > 83*  --  99*  --  99*  85*  --  104* 123*  CREATININE 4.75*   < > 5.82*  --  6.82*  --  8.26*  8.13*  --  9.21* 9.84*  CALCIUM 8.2*   < > 8.0*  --  7.7*  --  7.4*  8.1*  --  7.2* 7.4*  MG 2.3  --   --   --   --   --  2.3  --   --   --   PHOS  --   --   --   --   --   --  5.0*  --   --   --    < > = values in this interval not displayed.   GFR Estimated Creatinine Clearance: 10.1 mL/min (A) (by C-G formula based on SCr of 9.84 mg/dL (H)). Liver Function Tests: Recent Labs  Lab 02/08/20 0411 02/09/20 0405 02/10/20 0414 02/11/20 0326 02/12/20 0850  AST 14* $Remov'17 20 20 23  'NLeXUv$ ALT 9 '5 5 5 5  '$ ALKPHOS 44 45 44 47 48  BILITOT 0.7 0.7 0.7 1.1 0.9  PROT 4.9* 4.6* 4.5* 4.3* 4.3*  ALBUMIN 2.3* 2.1* 2.1*  2.3* 2.2* 2.0*   No results for input(s): LIPASE, AMYLASE in the last 168 hours. No results for input(s): AMMONIA in the last 168 hours. Coagulation profile No results for input(s): INR, PROTIME in the last 168 hours. COVID-19 Labs  No results for input(s): DDIMER, FERRITIN, LDH, CRP in the last 72 hours.  Lab Results  Component Value Date   SARSCOV2NAA NEGATIVE 02/03/2020   [place] NEGATIVE 09/22/2019   [place] NEGATIVE 08/02/2019   [place] NEGATIVE 05/15/2019    CBC: Recent Labs  Lab 02/09/20 0405 02/10/20 0414 02/10/20 1820 02/11/20 0326 02/12/20 0850  WBC 4.4 8.4 9.6 9.9 7.6  NEUTROABS 2.7 5.5 7.3 7.0 5.2  HGB 9.1* 9.5* 9.2* 8.1* 7.9*  HCT 25.9* 27.0* 27.2*  24.2* 24.0*  MCV 95.2 96.1 97.1 98.8 100.8*  PLT 21* 25* 34* 35* 36*   Cardiac Enzymes: No results for input(s): CKTOTAL, CKMB, CKMBINDEX, TROPONINI in the last 168 hours. BNP (last 3 results) No results for input(s): PROBNP in the last 8760 hours. CBG: Recent Labs  Lab 02/08/20 0738 02/08/20 1125 02/08/20 1632 02/08/20 2106 02/09/20 0716  GLUCAP 117* 116* 135* 122* 171*   D-Dimer: No results for input(s): DDIMER in the last 72 hours. Hgb A1c: No results for input(s): HGBA1C in the last 72 hours. Lipid Profile: No results for input(s): CHOL, HDL, LDLCALC, TRIG, CHOLHDL, LDLDIRECT in the last 72 hours. Thyroid function studies: No results for input(s): TSH, T4TOTAL, T3FREE, THYROIDAB in the last 72 hours.  Invalid input(s): FREET3 Anemia work up: No results for input(s): VITAMINB12, FOLATE, FERRITIN, TIBC, IRON, RETICCTPCT in the last 72 hours. Sepsis Labs: Recent Labs  Lab 02/10/20 0414 02/10/20 1820 02/11/20 0326 02/12/20 0850  WBC 8.4 9.6 9.9 7.6   Microbiology Recent Results (from the past 240 hour(s))  Respiratory Panel by RT PCR (Flu A&B, Covid) - Nasopharyngeal Swab     Status: None   Collection Time: 02/03/20 11:28 AM   Specimen: Nasopharyngeal Swab  Result Value Ref Range Status   SARS Coronavirus 2 by RT PCR NEGATIVE NEGATIVE Final    Comment: (NOTE) SARS-CoV-2 target nucleic acids are NOT DETECTED.  The SARS-CoV-2 RNA is generally detectable in upper respiratoy specimens during  the acute phase of infection. The lowest concentration of SARS-CoV-2 viral copies this assay can detect is 131 copies/mL. A negative result does not preclude SARS-Cov-2 infection and should not be used as the sole basis for treatment or other patient management decisions. A negative result may occur with  improper specimen collection/handling, submission of specimen other than nasopharyngeal swab, presence of viral mutation(s) within the areas targeted by this assay, and  inadequate number of viral copies (<131 copies/mL). A negative result must be combined with clinical observations, patient history, and epidemiological information. The expected result is Negative.  Fact Sheet for Patients:  PinkCheek.be  Fact Sheet for Healthcare Providers:  GravelBags.it  This test is no t yet approved or cleared by the [place] FDA and  has been authorized for detection and/or diagnosis of SARS-CoV-2 by FDA under an Emergency Use Authorization (EUA). This EUA will remain  in effect (meaning this test can be used) for the duration of the COVID-19 declaration under Section 564(b)(1) of the Act, 21 U.S.C. section 360bbb-3(b)(1), unless the authorization is terminated or revoked sooner.     Influenza A by PCR NEGATIVE NEGATIVE Final   Influenza B by PCR NEGATIVE NEGATIVE Final    Comment: (NOTE) The Xpert Xpress SARS-CoV-2/FLU/RSV assay is intended as an aid in  the diagnosis of influenza from Nasopharyngeal swab specimens and  should not be used as a sole basis for treatment. Nasal washings and  aspirates are unacceptable for Xpert Xpress SARS-CoV-2/FLU/RSV  testing.  Fact Sheet for Patients: PinkCheek.be  Fact Sheet for Healthcare Providers: GravelBags.it  This test is not yet approved or cleared by the [place] FDA and  has been authorized for detection and/or diagnosis of SARS-CoV-2 by  FDA under an Emergency Use Authorization (EUA). This EUA will remain  in effect (meaning this test can be used) for the duration of the  Covid-19 declaration under Section 564(b)(1) of the Act, 21  U.S.C. section 360bbb-3(b)(1), unless the authorization is  terminated or revoked. Performed at [facility], [place] [address], [place], [place] [address]   Culture, blood (x 2)     Status: Abnormal   Collection Time: 02/03/20  6:50 PM    Specimen: BLOOD RIGHT HAND  Result Value Ref Range Status   Specimen Description   Final    BLOOD RIGHT HAND Performed at [facility] [address], [place], [place] [address]    Special Requests   Final    BOTTLES DRAWN AEROBIC ONLY Blood Culture results may not be optimal due to an inadequate volume of blood received in culture bottles Performed at [facility] [address] [place], [place] [address]    Culture  Setup Time   Final    GRAM NEGATIVE RODS AEROBIC BOTTLE ONLY CRITICAL VALUE NOTED.  VALUE IS CONSISTENT WITH PREVIOUSLY REPORTED AND CALLED VALUE.    Culture (A)  Final    KLEBSIELLA PNEUMONIAE SUSCEPTIBILITIES PERFORMED ON PREVIOUS CULTURE WITHIN THE LAST 5 DAYS. Performed at [facility], [place] [address], [place], [place] [address]    Report Status 02/06/2020 FINAL  Final  Culture, blood (x 2)     Status: Abnormal   Collection Time: 02/03/20  6:50 PM   Specimen: BLOOD  Result Value Ref Range Status   Specimen Description   Final    BLOOD RIGHT ANTECUBITAL Performed at [facility], [place] [address], [place], [place] [address]    Special Requests   Final  BOTTLES DRAWN AEROBIC ONLY Blood Culture results may not be optimal due to an excessive volume of blood received in culture bottles Performed at [facility], [place] [address] [place], [place] [address]    Culture  Setup Time   Final    GRAM NEGATIVE RODS AEROBIC BOTTLE ONLY CRITICAL RESULT CALLED TO, READ BACK BY AND VERIFIED WITH: T,GREEN PHARMD $RemoveBefore'@1113'[id number]$  02/04/20 EB Performed at [facility] [address], [place], [place] [address]    Culture KLEBSIELLA PNEUMONIAE (A)  Final   Report Status 02/06/2020 FINAL  Final   Organism ID, Bacteria KLEBSIELLA PNEUMONIAE  Final      Susceptibility   Klebsiella pneumoniae - MIC*    AMPICILLIN >=32 RESISTANT Resistant     CEFAZOLIN <=4 SENSITIVE Sensitive     CEFEPIME <=0.12 SENSITIVE  Sensitive     CEFTAZIDIME <=1 SENSITIVE Sensitive     CEFTRIAXONE <=0.25 SENSITIVE Sensitive     CIPROFLOXACIN <=0.25 SENSITIVE Sensitive     GENTAMICIN <=1 SENSITIVE Sensitive     IMIPENEM <=0.25 SENSITIVE Sensitive     TRIMETH/SULFA <=20 SENSITIVE Sensitive     AMPICILLIN/SULBACTAM 4 SENSITIVE Sensitive     PIP/TAZO <=4 SENSITIVE Sensitive     * KLEBSIELLA PNEUMONIAE  Blood Culture ID Panel (Reflexed)     Status: Abnormal   Collection Time: 02/03/20  6:50 PM  Result Value Ref Range Status   Enterococcus faecalis NOT DETECTED NOT DETECTED Final   Enterococcus Faecium NOT DETECTED NOT DETECTED Final   Listeria monocytogenes NOT DETECTED NOT DETECTED Final   Staphylococcus species NOT DETECTED NOT DETECTED Final   Staphylococcus aureus (BCID) NOT DETECTED NOT DETECTED Final   Staphylococcus epidermidis NOT DETECTED NOT DETECTED Final   Staphylococcus lugdunensis NOT DETECTED NOT DETECTED Final   Streptococcus species NOT DETECTED NOT DETECTED Final   Streptococcus agalactiae NOT DETECTED NOT DETECTED Final   Streptococcus pneumoniae NOT DETECTED NOT DETECTED Final   Streptococcus pyogenes NOT DETECTED NOT DETECTED Final   A.calcoaceticus-baumannii NOT DETECTED NOT DETECTED Final   Bacteroides fragilis NOT DETECTED NOT DETECTED Final   Enterobacterales DETECTED (A) NOT DETECTED Final    Comment: Enterobacterales represent a large order of gram negative bacteria, not a single organism. CRITICAL RESULT CALLED TO, READ BACK BY AND VERIFIED WITH: T,GREEN PHARMD $RemoveBefore'@1113'[id number]$  02/04/20 EB    Enterobacter cloacae complex NOT DETECTED NOT DETECTED Final   Escherichia coli NOT DETECTED NOT DETECTED Final   Klebsiella aerogenes NOT DETECTED NOT DETECTED Final   Klebsiella oxytoca NOT DETECTED NOT DETECTED Final   Klebsiella pneumoniae DETECTED (A) NOT DETECTED Final    Comment: CRITICAL RESULT CALLED TO, READ BACK BY AND VERIFIED WITH: T,GREEN PHARMD $RemoveBefore'@1113'[id number]$  02/04/20 EB    Proteus species NOT  DETECTED NOT DETECTED Final   Salmonella species NOT DETECTED NOT DETECTED Final   Serratia marcescens NOT DETECTED NOT DETECTED Final   Haemophilus influenzae NOT DETECTED NOT DETECTED Final   Neisseria meningitidis NOT DETECTED NOT DETECTED Final   Pseudomonas aeruginosa NOT DETECTED NOT DETECTED Final   Stenotrophomonas maltophilia NOT DETECTED NOT DETECTED Final   Candida albicans NOT DETECTED NOT DETECTED Final   Candida auris NOT DETECTED NOT DETECTED Final   Candida glabrata NOT DETECTED NOT DETECTED Final   Candida krusei NOT DETECTED NOT DETECTED Final   Candida parapsilosis NOT DETECTED NOT DETECTED Final   Candida tropicalis NOT DETECTED NOT DETECTED Final   Cryptococcus neoformans/gattii NOT DETECTED NOT DETECTED Final   CTX-M ESBL NOT DETECTED NOT DETECTED  Final   Carbapenem resistance IMP NOT DETECTED NOT DETECTED Final   Carbapenem resistance KPC NOT DETECTED NOT DETECTED Final   Carbapenem resistance NDM NOT DETECTED NOT DETECTED Final   Carbapenem resist OXA 48 LIKE NOT DETECTED NOT DETECTED Final   Carbapenem resistance VIM NOT DETECTED NOT DETECTED Final    Comment: Performed at [facility], [place] [address] [place], [place] [address]  C Difficile Quick Screen w PCR reflex     Status: None   Collection Time: 02/04/20  5:07 PM   Specimen: STOOL  Result Value Ref Range Status   C Diff antigen NEGATIVE NEGATIVE Final   C Diff toxin NEGATIVE NEGATIVE Final   C Diff interpretation No C. difficile detected.  Final    Comment: Performed at [facility], [place] [address] [place], [place] [address]  Gastrointestinal Panel by PCR , Stool     Status: None   Collection Time: 02/06/20 10:54 AM   Specimen: Stool  Result Value Ref Range Status   Campylobacter species NOT DETECTED NOT DETECTED Final   Plesimonas shigelloides NOT DETECTED NOT DETECTED Final   Salmonella species NOT DETECTED NOT DETECTED Final   Yersinia enterocolitica NOT DETECTED NOT  DETECTED Final   Vibrio species NOT DETECTED NOT DETECTED Final   Vibrio cholerae NOT DETECTED NOT DETECTED Final   Enteroaggregative E coli (EAEC) NOT DETECTED NOT DETECTED Final   Enteropathogenic E coli (EPEC) NOT DETECTED NOT DETECTED Final   Enterotoxigenic E coli (ETEC) NOT DETECTED NOT DETECTED Final   Shiga like toxin producing E coli (STEC) NOT DETECTED NOT DETECTED Final   Shigella/Enteroinvasive E coli (EIEC) NOT DETECTED NOT DETECTED Final   Cryptosporidium NOT DETECTED NOT DETECTED Final   Cyclospora cayetanensis NOT DETECTED NOT DETECTED Final   Entamoeba histolytica NOT DETECTED NOT DETECTED Final   Giardia lamblia NOT DETECTED NOT DETECTED Final   Adenovirus F40/41 NOT DETECTED NOT DETECTED Final   Astrovirus NOT DETECTED NOT DETECTED Final   Norovirus GI/GII NOT DETECTED NOT DETECTED Final   Rotavirus A NOT DETECTED NOT DETECTED Final   Sapovirus (I, II, IV, and V) NOT DETECTED NOT DETECTED Final    Comment: Performed at [facility], [place] [place], [place] [address]  Culture, blood (Routine X 2) w Reflex to ID Panel     Status: None (Preliminary result)   Collection Time: 02/09/20 10:48 AM   Specimen: BLOOD  Result Value Ref Range Status   Specimen Description   Final    BLOOD LEFT ARM Performed at [facility], [place] [address], [place], [place] [address]    Special Requests   Final    BOTTLES DRAWN AEROBIC AND ANAEROBIC Blood Culture adequate volume Performed at [facility] [address], [place], [place] [address]    Culture   Final    NO GROWTH 4 DAYS Performed at [facility], [place] [address], [place], [place] [address]    Report Status PENDING  Incomplete  Culture, blood (Routine X 2) w Reflex to ID Panel     Status: None (Preliminary result)   Collection Time: 02/09/20 10:50 AM   Specimen: BLOOD  Result Value Ref Range Status   Specimen Description   Final    BLOOD LEFT ARM Performed at  [facility] [address] [place], [place] [address]    Special Requests   Final    BOTTLES DRAWN AEROBIC AND ANAEROBIC Blood Culture adequate  volume Performed at [facility], [place] [address], [place], [place] [address]    Culture   Final    NO GROWTH 4 DAYS Performed at [facility], [place] [address] [place], [place] [address]    Report Status PENDING  Incomplete     Medications:   . sodium chloride   Intravenous Once  . Chlorhexidine Gluconate Cloth  6 each Topical Daily  . metoprolol tartrate  5 mg Intravenous Q6H  . oxybutynin  10 mg Oral QHS  . sodium chloride flush  10-40 mL Intracatheter Q12H   Continuous Infusions: . sodium chloride 150 mL/hr at 02/12/20 1559  .  ceFAZolin (ANCEF) IV 1 g (02/12/20 0927)  . pantoprozole (PROTONIX) infusion 8 mg/hr (02/12/20 0418)      LOS: 10 days   Charlynne Cousins  [facility]  02/13/2020, 7:30 AM

## 2020-02-13 NOTE — Progress Notes (Signed)
Physical Therapy Treatment Patient Details Name: [name] Rose MRN: [id number] DOB: 08/15/1950 Today's Date: 02/13/2020    History of Present Illness 69 yo male admitted with FTT, pancytopenia, thrombocytopenia, sepsis, weakness. hx of met prostate ca with bladder mets, Afib, CKD    PT Comments    Patient progressing towards physical therapy goals. Patient ambulated 25' with RW and min guard, pt unaware of having bowel movement while ambulating. Wife assist to position BSC, minA required for sit to stand off BSC, totalA for pericare. Patient continues to be limited by decreased activity tolerance, impaired balance, generalized weakness, and impaired functional mobility. Continue to recommend HHPT following discharge to maximize functional independence.    Follow Up Recommendations  Home health PT;Supervision/Assistance - 24 hour     Equipment Recommendations  None recommended by PT    Recommendations for Other Services       Precautions / Restrictions Precautions Precautions: Fall    Mobility  Bed Mobility Overal bed mobility: Needs Assistance Bed Mobility: Supine to Sit;Sit to Supine     Supine to sit: Supervision Sit to supine: Supervision   General bed mobility comments: pt. needed use of bed rails.   Transfers Overall transfer level: Needs assistance Equipment used: Rolling  (2 wheeled) Transfers: Sit to/from Stand Sit to Stand: Min guard         General transfer comment: elevated surface  Ambulation/Gait Ambulation/Gait assistance: Min guard Gait Distance (Feet): 25 Feet Assistive device: Rolling  (2 wheeled) Gait Pattern/deviations: Step-through pattern;Decreased stride length Gait velocity: decreased       Stairs             Wheelchair Mobility    Modified Rankin (Stroke Patients Only)       Balance Overall balance assessment: Needs assistance Sitting-balance support: No upper extremity supported;Feet supported Sitting  balance-Leahy Scale: Good     Standing balance support: Bilateral upper extremity supported;During functional activity Standing balance-Leahy Scale: Fair                              Cognition Arousal/Alertness: Awake/alert Behavior During Therapy: WFL for tasks assessed/performed Overall Cognitive Status: Within Functional Limits for tasks assessed                                        Exercises      General Comments General comments (skin integrity, edema, etc.): Patient unaware of bowel movement while ambulating, wife assisted to position BSC. minA for sit to stand from BSC with totalA for pericare.       Pertinent Vitals/Pain Pain Assessment: No/denies pain    Home Living                      Prior Function            PT Goals (current goals can now be found in the care plan section) Acute Rehab PT Goals Patient Stated Goal: regain strength and stop diarrhea PT Goal Formulation: With patient Time For Goal Achievement: 02/19/20 Potential to Achieve Goals: Good Progress towards PT goals: Progressing toward goals    Frequency    Min 3X/week      PT Plan Current plan remains appropriate    Co-evaluation              AM-PAC PT "6 Clicks" Mobility     Outcome Measure  Help needed turning from your back to your side while in a flat bed without using bedrails?: A Little Help needed moving from lying on your back to sitting on the side of a flat bed without using bedrails?: A Little Help needed moving to and from a bed to a chair (including a wheelchair)?: A Little Help needed standing up from a chair using your arms (e.g., wheelchair or bedside chair)?: A Little Help needed to walk in hospital room?: A Little Help needed climbing 3-5 steps with a railing? : A Lot 6 Click Score: 17    End of Session Equipment Utilized During Treatment: Gait belt Activity Tolerance: Patient tolerated treatment well Patient left: in  bed;with call bell/phone within reach;with bed alarm set Nurse Communication: Mobility status PT Visit Diagnosis: Muscle weakness (generalized) (M62.81);History of falling (Z91.81);Repeated falls (R29.6)     Time: 1221-1247 PT Time Calculation (min) (ACUTE ONLY): 26 min  Charges:  $Therapeutic Activity: 23-37 mins                      Allred, PT, DPT Acute Rehabilitation Services Pager [phone] Office [phone]     K Allred 02/13/2020, 1:01 PM   

## 2020-02-13 NOTE — Progress Notes (Signed)
Mr. Madole is about the same.  Thankfully, the abdominal flatplate shows there is less gaseous distention in the abdomen.  Hopefully, the NG tube can be pulled.  He is still having diarrhea.  I suspect the diarrhea is probably from the combination of the ileus and the antibiotics that he was on for the Klebsiella.  He still is trying to hold off on getting dialysis.  Nephrology is following him closely.  His labs not yet back today.  There is no evidence of uremia although he certainly is getting up there with his BUN.  He has had no issues with pain.  He has had no cough or shortness of breath.  He has had no rashes.  He actually was up yesterday and walked a little bit.  He still is on quite a bit of IV fluids.  Maybe these will be able to be slowed down a little bit.  Again, I would like to hope that the NG tube will be able to come out today.  This will make his life a whole lot better.  Maybe we can then start some probiotic on him to see if this does not help with the diarrhea.  On his exam, there are some bowel sounds in the abdomen.  There is no distention.  His abdomen is soft.  His lungs sound clear.  Cardiac exam regular rate and rhythm with occasional extra beat.  It sounds like sinus rhythm.  However, on the monitor, it still looks like atrial fibrillation.  Extremities show some mild edema in the legs.  We will see what the labs today show.  I would think that if the BUN and creatinine are higher, he might be a candidate for dialysis at that point.  I appreciate the great care that he is getting from all staff on 3 E.   [name], MD  Psalm 43:3

## 2020-02-13 NOTE — Plan of Care (Signed)
  Problem: Education: Goal: Knowledge of General Education information will improve Description: Including pain rating scale, medication(s)/side effects and non-pharmacologic comfort measures Outcome: Progressing   Problem: Health Behavior/Discharge Planning: Goal: Ability to manage health-related needs will improve Outcome: Progressing   Problem: Clinical Measurements: Goal: Respiratory complications will improve Outcome: Progressing   

## 2020-02-14 DIAGNOSIS — I4891 Unspecified atrial fibrillation: Secondary | ICD-10-CM | POA: Diagnosis not present

## 2020-02-14 DIAGNOSIS — R140 Abdominal distension (gaseous): Secondary | ICD-10-CM

## 2020-02-14 DIAGNOSIS — N170 Acute kidney failure with tubular necrosis: Secondary | ICD-10-CM | POA: Diagnosis not present

## 2020-02-14 DIAGNOSIS — N183 Chronic kidney disease, stage 3 unspecified: Secondary | ICD-10-CM | POA: Diagnosis not present

## 2020-02-14 DIAGNOSIS — N179 Acute kidney failure, unspecified: Secondary | ICD-10-CM | POA: Diagnosis not present

## 2020-02-14 DIAGNOSIS — D701 Agranulocytosis secondary to cancer chemotherapy: Secondary | ICD-10-CM | POA: Diagnosis not present

## 2020-02-14 LAB — CBC WITH DIFFERENTIAL/PLATELET
Abs Immature Granulocytes: 0.27 10*3/uL — ABNORMAL HIGH (ref 0.00–0.07)
Basophils Absolute: 0 10*3/uL (ref 0.0–0.1)
Basophils Relative: 0 %
Eosinophils Absolute: 0.3 10*3/uL (ref 0.0–0.5)
Eosinophils Relative: 5 %
HCT: 24 % — ABNORMAL LOW (ref 39.0–52.0)
Hemoglobin: 8 g/dL — ABNORMAL LOW (ref 13.0–17.0)
Immature Granulocytes: 4 %
Lymphocytes Relative: 8 %
Lymphs Abs: 0.6 10*3/uL — ABNORMAL LOW (ref 0.7–4.0)
MCH: 33.6 pg (ref 26.0–34.0)
MCHC: 33.3 g/dL (ref 30.0–36.0)
MCV: 100.8 fL — ABNORMAL HIGH (ref 80.0–100.0)
Monocytes Absolute: 1 10*3/uL (ref 0.1–1.0)
Monocytes Relative: 15 %
Neutro Abs: 4.5 10*3/uL (ref 1.7–7.7)
Neutrophils Relative %: 68 %
Platelets: 44 10*3/uL — ABNORMAL LOW (ref 150–400)
RBC: 2.38 MIL/uL — ABNORMAL LOW (ref 4.22–5.81)
RDW: 15.5 % (ref 11.5–15.5)
WBC: 6.7 10*3/uL (ref 4.0–10.5)
nRBC: 0 % (ref 0.0–0.2)

## 2020-02-14 LAB — BASIC METABOLIC PANEL
Anion gap: 12 (ref 5–15)
BUN: 93 mg/dL — ABNORMAL HIGH (ref 8–23)
CO2: 19 mmol/L — ABNORMAL LOW (ref 22–32)
Calcium: 7.1 mg/dL — ABNORMAL LOW (ref 8.9–10.3)
Chloride: 108 mmol/L (ref 98–111)
Creatinine, Ser: 8.66 mg/dL — ABNORMAL HIGH (ref 0.61–1.24)
GFR, Estimated: 6 mL/min — ABNORMAL LOW (ref >60)
Glucose, Bld: 128 mg/dL — ABNORMAL HIGH (ref 70–99)
Potassium: 3 mmol/L — ABNORMAL LOW (ref 3.5–5.1)
Sodium: 139 mmol/L (ref 135–145)

## 2020-02-14 LAB — CULTURE, BLOOD (ROUTINE X 2)
Culture: NO GROWTH
Culture: NO GROWTH
Special Requests: ADEQUATE
Special Requests: ADEQUATE

## 2020-02-14 MED ORDER — PSYLLIUM 95 % PO PACK
1.0000 | PACK | Freq: Every day | ORAL | Status: DC
  Administered 2020-02-14 – 2020-02-16 (×3): 1 via ORAL
  Filled 2020-02-14 (×3): qty 1

## 2020-02-14 MED ORDER — POTASSIUM CHLORIDE CRYS ER 20 MEQ PO TBCR
20.0000 meq | EXTENDED_RELEASE_TABLET | Freq: Two times a day (BID) | ORAL | Status: DC
Start: 2020-02-14 — End: 2020-02-14

## 2020-02-14 MED ORDER — PANTOPRAZOLE SODIUM 40 MG PO TBEC
40.0000 mg | DELAYED_RELEASE_TABLET | Freq: Every day | ORAL | Status: DC
  Administered 2020-02-15: 40 mg via ORAL
  Filled 2020-02-14: qty 1

## 2020-02-14 MED ORDER — POTASSIUM CHLORIDE 20 MEQ PO PACK
40.0000 meq | PACK | Freq: Four times a day (QID) | ORAL | Status: AC
  Administered 2020-02-14 (×2): 40 meq via ORAL
  Filled 2020-02-14 (×2): qty 2

## 2020-02-14 MED ORDER — APIXABAN 2.5 MG PO TABS
2.5000 mg | ORAL_TABLET | Freq: Two times a day (BID) | ORAL | Status: DC
  Filled 2020-02-14: qty 1

## 2020-02-14 MED ORDER — SODIUM CHLORIDE 0.9 % IV SOLN: INTRAVENOUS | Status: DC

## 2020-02-14 MED ORDER — POTASSIUM CHLORIDE CRYS ER 20 MEQ PO TBCR
20.0000 meq | EXTENDED_RELEASE_TABLET | Freq: Once | ORAL | Status: AC
  Administered 2020-02-14: 20 meq via ORAL
  Filled 2020-02-14: qty 1

## 2020-02-14 MED ORDER — DIPHENOXYLATE-ATROPINE 2.5-0.025 MG/5ML PO LIQD: 5.0000 mL | Freq: Four times a day (QID) | ORAL | Status: DC | PRN

## 2020-02-14 MED ORDER — SODIUM CHLORIDE 0.9 % IV SOLN: INTRAVENOUS | Status: AC

## 2020-02-14 MED ORDER — METOPROLOL TARTRATE 50 MG PO TABS
50.0000 mg | ORAL_TABLET | Freq: Two times a day (BID) | ORAL | Status: DC
  Administered 2020-02-14 – 2020-02-25 (×23): 50 mg via ORAL
  Filled 2020-02-14 (×23): qty 1

## 2020-02-14 MED ORDER — DIPHENHYDRAMINE HCL 25 MG PO CAPS
25.0000 mg | ORAL_CAPSULE | Freq: Four times a day (QID) | ORAL | Status: DC | PRN
  Administered 2020-02-14 – 2020-02-23 (×11): 25 mg via ORAL
  Filled 2020-02-14 (×12): qty 1

## 2020-02-14 NOTE — Progress Notes (Signed)
Occupational Therapy Treatment Patient Details Name: [name] Rose MRN: [id number] DOB: 08-18-1950 Today's Date: 02/14/2020    History of present illness 69 yo male admitted with FTT, pancytopenia, thrombocytopenia, sepsis, weakness. hx of met prostate ca with bladder mets, Afib, CKD   OT comments  Pt ambulated 25 feet with RW and min guard assist. Sat at sink for grooming. Assisted for donning and doffing Depends. Pt with ongoing loose bowels, total assist for pericare. Left pt up in chair with all needs met. Pt is significantly deconditioned.   Follow Up Recommendations  Home health OT    Equipment Recommendations  None recommended by OT    Recommendations for Other Services      Precautions / Restrictions Precautions Precautions: Fall       Mobility Bed Mobility Overal bed mobility: Needs Assistance Bed Mobility: Supine to Sit     Supine to sit: Supervision     General bed mobility comments: HOB up  Transfers   Equipment used: Rolling walker (2 wheeled) Transfers: Sit to/from Stand Sit to Stand: Min guard         General transfer comment: use of momentum    Balance Overall balance assessment: Needs assistance   Sitting balance-Leahy Scale: Good     Standing balance support: Bilateral upper extremity supported;During functional activity Standing balance-Leahy Scale: Poor                             ADL either performed or assessed with clinical judgement   ADL Overall ADL's : Needs assistance/impaired     Grooming: Wash/dry hands;Wash/dry face;Oral care;Sitting;Set up               Lower Body Dressing: Maximal assistance;Sit to/from stand Lower Body Dressing Details (indicate cue type and reason): for depends Toilet Transfer: Min guard;Ambulation;RW;BSC   Toileting- Clothing Manipulation and Hygiene: Total assistance;Sit to/from stand       Functional mobility during ADLs: Min guard;Rolling walker (to door and back to bed)        Vision       Perception     Praxis      Cognition Arousal/Alertness: Awake/alert Behavior During Therapy: WFL for tasks assessed/performed Overall Cognitive Status: Within Functional Limits for tasks assessed                                          Exercises     Shoulder Instructions       General Comments      Pertinent Vitals/ Pain       Pain Assessment: No/denies pain  Home Living                                          Prior Functioning/Environment              Frequency  Min 2X/week        Progress Toward Goals  OT Goals(current goals can now be found in the care plan section)  Progress towards OT goals: Progressing toward goals  Acute Rehab OT Goals Patient Stated Goal: regain strength and stop diarrhea OT Goal Formulation: With patient Time For Goal Achievement: 02/20/20 Potential to Achieve Goals: Good  Plan Discharge plan remains appropriate    Co-evaluation  AM-PAC OT "6 Clicks" Daily Activity     Outcome Measure   Help from another person eating meals?: None Help from another person taking care of personal grooming?: A Little Help from another person toileting, which includes using toliet, bedpan, or urinal?: Total Help from another person bathing (including washing, rinsing, drying)?: A Lot Help from another person to put on and taking off regular upper body clothing?: A Little Help from another person to put on and taking off regular lower body clothing?: Total 6 Click Score: 14    End of Session Equipment Utilized During Treatment: Rolling walker;Gait belt  OT Visit Diagnosis: Muscle weakness (generalized) (M62.81)   Activity Tolerance Patient limited by fatigue   Patient Left in chair;with call bell/phone within reach   Nurse Communication          Time: 9310-9145 OT Time Calculation (min): 49 min  Charges: OT General Charges $OT Visit: 1 Visit OT  Treatments $Self Care/Home Management : 38-52 mins  Nestor Lewandowsky, OTR/L Acute Rehabilitation Services Pager: [phone] Office: [phone]   Malka So 02/14/2020, 9:29 AM

## 2020-02-14 NOTE — Progress Notes (Signed)
Progress Note  Patient Name: [name] Rose Date of Encounter: 02/14/2020  [facility] Cardiologist: Pixie Casino, MD   Subjective   Tolerating liquids still making urine   Inpatient Medications    Scheduled Meds: . sodium chloride   Intravenous Once  . cephALEXin  500 mg Oral Q12H  . Chlorhexidine Gluconate Cloth  6 each Topical Daily  . metoprolol tartrate  25 mg Oral BID  . oxybutynin  10 mg Oral QHS  . pantoprazole (PROTONIX) IV  40 mg Intravenous Q12H  . sodium chloride flush  10-40 mL Intracatheter Q12H   Continuous Infusions:  PRN Meds: acetaminophen **OR** [DISCONTINUED] acetaminophen, melatonin, ondansetron **OR** ondansetron (ZOFRAN) IV, sodium chloride flush   Vital Signs    Vitals:   02/13/20 1704 02/13/20 2144 02/14/20 0536 02/14/20 0625  BP: 107/71 135/82  119/82  Pulse: (!) 50 93  97  Resp: $Remo'20 20  20  'pkLFA$ Temp: 97.7 F (36.5 C) 98 F (36.7 C)  97.7 F (36.5 C)  TempSrc: Oral Oral  Oral  SpO2: 99% 93%  100%  Weight:   127.9 kg   Height:        Intake/Output Summary (Last 24 hours) at 02/14/2020 0725 Last data filed at 02/13/2020 2333 Gross per 24 hour  Intake 240 ml  Output 250 ml  Net -10 ml   Last 3 Weights 02/14/2020 02/13/2020 02/12/2020  Weight (lbs) 282 lb 274 lb 262 lb  Weight (kg) 127.914 kg 124.286 kg 118.842 kg      Telemetry    Atrial fibrillation with rates mostly in the 80's to 110's but as high as the 150's to 160's at times. - Personally Reviewed  ECG    No new ECG tracing today. - Personally Reviewed  Physical Exam   GEN: No acute distress.   Neck: Supple. Cardiac: Tachycardia with irregularly irregular rhythm. No murmurs, rubs, or gallops.  Respiratory: Clear to auscultation bilaterally. GI: Soft, non-tender, non-distended  MS: 1+ pitting edema of bilateral lower extremities. No deformity. Skin: Warm and dry. Neuro:  No focal deficits. Psych: Normal affect. Responds appropriately.  Labs    High Sensitivity  Troponin:  No results for input(s): TROPONINIHS in the last 720 hours.    Chemistry Recent Labs  Lab 02/10/20 0414 02/10/20 0414 02/11/20 0326 02/12/20 0850 02/13/20 1450  NA 140  145   < > 142 145 138  K 2.8*  2.8*   < > 3.5 3.3* 2.8*  CL 97*  98   < > 99 104 105  CO2 25  26   < > 23 22 20*  GLUCOSE 115*  113*   < > 126* 95 156*  BUN 99*  85*   < > 104* 123* 105*  CREATININE 8.26*  8.13*   < > 9.21* 9.84* 8.57*  CALCIUM 7.4*  8.1*   < > 7.2* 7.4* 6.9*  PROT 4.5*  --  4.3* 4.3*  --   ALBUMIN 2.1*  2.3*  --  2.2* 2.0*  --   AST 20  --  20 23  --   ALT 5  --  5 5  --   ALKPHOS 44  --  47 48  --   BILITOT 0.7  --  1.1 0.9  --   GFRNONAA 7*  7*   < > 6* 5* 6*  ANIONGAP 18*  21*   < > 20* 19* 13   < > = values in this interval not displayed.  Hematology Recent Labs  Lab 02/11/20 0326 02/12/20 0850 02/13/20 0802  WBC 9.9 7.6 7.4  RBC 2.45* 2.38* 2.47*  HGB 8.1* 7.9* 8.2*  HCT 24.2* 24.0* 24.9*  MCV 98.8 100.8* 100.8*  MCH 33.1 33.2 33.2  MCHC 33.5 32.9 32.9  RDW 15.0 15.4 15.2  PLT 35* 36* 41*    BNPNo results for input(s): BNP, PROBNP in the last 168 hours.   DDimer No results for input(s): DDIMER in the last 168 hours.   Radiology    DG Abd Portable 1V  Result Date: 02/12/2020 CLINICAL DATA:  Ileus. EXAM: PORTABLE ABDOMEN - 1 VIEW COMPARISON:  02/10/2020 FINDINGS: Gaseous small bowel distention seen on the previous exam has decreased in the interval. A few scattered small bowel loops remain minimally distended up to about 3.4 cm. Gas is visible in the right and left colon. Degenerative changes noted lumbar spine. IMPRESSION: Interval decrease in gaseous bowel distention. Electronically Signed   By: Misty Stanley M.D.   On: 02/12/2020 08:05    Cardiac Studies   Echocardiogram 10/05/2019: Impressions:  1. Normal LV function; mild LVE; trace MR and TR.   2. Left ventricular ejection fraction, by estimation, is 55 to 60%. The  left ventricle has  normal function. The left ventricle has no regional  wall motion abnormalities. The left ventricular internal cavity size was  mildly dilated. Left ventricular  diastolic parameters were normal.   3. Right ventricular systolic function is normal. The right ventricular  size is normal. There is mildly elevated pulmonary artery systolic  pressure.   4. The mitral valve is normal in structure. Trivial mitral valve  regurgitation. No evidence of mitral stenosis.   5. The aortic valve has an indeterminant number of cusps. Aortic valve  regurgitation is not visualized. No aortic stenosis is present.   6. The inferior vena cava is normal in size with greater than 50%  respiratory variability, suggesting right atrial pressure of 3 mmHg.  Patient Profile     69 y.o. male with a history of persistent atrial fibrillation on Tikosyn and Eliquis, PVCs,  mitral regurgitation, hypertension, hyperlipidemia, metastatic bladder cancer to intra-abdominal lymph node on chemotherapy, chronic anemia, and CKD stage IV. Patient admitted with sepsis, ileus, and progressive AKI. Cardiology was consulted for evaluation of atrial fibrillation with RVR at the request of Dr. Tawanna Solo.  Assessment & Plan    Persistent Atrial Fibrillation - rates up will increase lopressor to 50 mg bid BP better concern for anemia and low platelet counts eliquis has been held. Will start low dose Friday.   Thrombocytopenia - slightly better at 41 Heme indicated ok to resume anticoagulation when over 30 See above No bleeding issues     Acute on CKD Stage IV - still not on dialysis making urine no real uremic symptoms / rub Cr finally starting to head in down direction 8.57 yesterday    Otherwise, per primary team: - GI bleeding/anemia - Klebsiella pneumonia bacteremia - Bladder cancer  For questions or updates, please contact [facility] Please consult [email or web address] for contact info under        Signed, Jenkins Rouge, MD    02/14/2020, 7:25 AM

## 2020-02-14 NOTE — Progress Notes (Signed)
[facility] Progress Note   69 y.o.year-oldafib, HTN, CKD 4baseline creat is 1.9- 2.6 from 2021and bladder cancer w/ mets on chemoRx presented on 10/24 w/ gen'd weakness, diarrhea for 5 days, no appetite or fevers. Consulted for AKI likely secondary to hypotension causing ATN.   Assessment/ Plan:   1. AoCKD 4 - Baseline creat is 1.9- 2.6 from 2021, eGFR 26- 34 ml/min. Creat 2.5 on admitand increase significantly likely due to ATN.  Small improvement in kidney function over the past 24 hours creatinine now 8.6.  Continue to monitor -Oral hydration sufficient at this time.  Likely will just take time for ATN to resolve -No indications for dialysis at this time  2. Klebs pna neutropenic sepsis -BP is improved, on antibiotics per primary team 3. Neutropenia/ pancytopenia - per pmd, ONC 4. Atrial fib -continue home metoprolol 5. Metastatic bladder cancer - per pmd 6. Thrombocytopenia - some mild improvement 7. Hyperkalemia: Continue oral replacement today  Kelly Splinter, MD 02/14/2020, 10:36 AM      Subjective:   Patient somewhat frustrated this morning about a few things.  Otherwise feels well with no significant nausea or confusion.  Urine output intact.   Objective:   BP 119/82 (BP Location: Left Arm)   Pulse 97   Temp 97.7 F (36.5 C) (Oral)   Resp 20   Ht '6\' 2"'  (1.88 m)   Wt 127.9 kg   SpO2 100%   BMI 36.21 kg/m   Intake/Output Summary (Last 24 hours) at 02/14/2020 1036 Last data filed at 02/14/2020 0827 Gross per 24 hour  Intake 1200 ml  Output 400 ml  Net 800 ml   Weight change: 3.629 kg  Physical Exam: Genalert,lying in bed, no distress Chest bilateral chest rise with no increased work of breathing CVirreg irregrhythm, normal rate Abd soft ntnd no mass or ascites, NGT Exttr 1+ pitting edema in the bilateral lower extremities Neuro is alert, Ox 3 , nf, no asterixis  Imaging: No results found.  Labs: BMET Recent Labs  Lab  02/08/20 0411 02/09/20 0405 02/10/20 0414 02/11/20 0326 02/12/20 0850 02/13/20 1450 02/14/20 0906  NA 134* 137 140  145 142 145 138 139  K 3.0* 2.4* 2.8*  2.8* 3.5 3.3* 2.8* 3.0*  CL 104 98 97*  98 99 104 105 108  CO2 16* '23 25  26 23 22 ' 20* 19*  GLUCOSE 125* 165* 115*  113* 126* 95 156* 128*  BUN 83* 99* 99*  85* 104* 123* 105* 93*  CREATININE 5.82* 6.82* 8.26*  8.13* 9.21* 9.84* 8.57* 8.66*  CALCIUM 8.0* 7.7* 7.4*  8.1* 7.2* 7.4* 6.9* 7.1*  PHOS  --   --  5.0*  --   --   --   --    CBC Recent Labs  Lab 02/11/20 0326 02/12/20 0850 02/13/20 0802 02/14/20 0906  WBC 9.9 7.6 7.4 6.7  NEUTROABS 7.0 5.2 5.0 4.5  HGB 8.1* 7.9* 8.2* 8.0*  HCT 24.2* 24.0* 24.9* 24.0*  MCV 98.8 100.8* 100.8* 100.8*  PLT 35* 36* 41* 44*    Medications:    . sodium chloride   Intravenous Once  . cephALEXin  500 mg Oral Q12H  . Chlorhexidine Gluconate Cloth  6 each Topical Daily  . metoprolol tartrate  50 mg Oral BID  . oxybutynin  10 mg Oral QHS  . pantoprazole  40 mg Oral Daily  . potassium chloride  40 mEq Oral Q6H  . psyllium  1 packet Oral Daily  .  sodium chloride flush  10-40 mL Intracatheter Q12H

## 2020-02-14 NOTE — Progress Notes (Signed)
[facility] PROGRESS NOTE    Progress Note  [name] Rose  BPJ:[id number] DOB: [birth year] DOA: 02/03/2020 PCP: Shirline Frees, MD     Brief Narrative:   [name] Rose is an 69 y.o. male past medical history significant for metastatic bladder cancer currently on chemotherapy, paroxysmal atrial fibrillation, chronic kidney disease stage 3 comes into the ED for generalized weakness and diarrhea that started 4 days prior to admission.  On admission he was found to be septic with neutropenia with started on antibiotics oncology was consulted blood cultures grew Klebsiella.  Hospital course complicated by acute kidney injury and rectal and NG tube bleeding on 02/10/2020 he was transfused 1 unit of red blood cells, was started on IV Protonix, subsequently developed ileus which is slowly improving.  Assessment/Plan:   Acute renal failure with acute tubular necrosis superimposed on stage 3 chronic kidney disease ([place]): In the setting of sepsis and bladder cancer on chemo. Leading to possible ATN. Creatinine on admission around 2.5 it peaked at 9.8, he is having good urine output, yesterday 0.5, this morning is pending. Will liberalize diet.  Fluids have been KVO he continues to have good urine output it seems clear.  We will continue to follow strict I's and O's closely.  He is positive about 5 L weight is trending up. No uremic symptoms  Vomiting and diarrhea after chemotherapy now resolved, with ileus: X-ray was improved on 02/12/2020. Having watery diarrhea, no abdominal pain will start on Metamucil and 2 doses of Lomotil.  Klebsiella bacteremia/severe sepsis: He was started empirically on IV cefazolin on 02/03/2020 will need 10 to 14 days.  Has remained afebrile with no leukocytosis continue Keflex.  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 ([place]) Appreciate oncology's assistance.  Acute thrombocytopenia: Related to chemotherapy in the setting of  sepsis. Status post 1 unit of platelets, they are trending up.  GI bleed/chemotherapy-induced anemia: In the setting of thrombocytopenia and NG tube. Bleeding has resolved output is clear through the NG today. Status post 2 units of packed red blood cells on 02/04/2020. Change Protonix to oral.  A. fib with RVR: With a chads Vascor greater than 2. Currently rate controlled on Eliquis was held due to GI bleed and thrombocytopenia. Tikosyn has also been held due to acute kidney injury. Continue metoprolol and Cardizem. Can probably resume Eliquis in 3 to 4 days.  Hypokalemia: Replete orally basic metabolic panel is pending this morning.  DVT prophylaxis: scd Family Communication:none Status is: Inpatient  Remains inpatient appropriate because:Hemodynamically unstable   Dispo: The patient is from: Home              Anticipated d/c is to: Home              Anticipated d/c date is: 3 days              Patient currently is not medically stable to d/c.    Code Status:     Code Status Orders  (From admission, onward)         Start     Ordered   02/03/20 1433  Full code  Continuous        02/03/20 1432        Code Status History    Date Active Date Inactive Code Status Order ID Comments User Context   09/25/2019 1405 09/28/2019 2249 Full Code [id number]  Sherran Needs, NP Inpatient   09/08/2018 1209 10/14/2018 1649 Full Code [id number]  Mariel Aloe, MD  Inpatient   06/21/2018 1713 06/29/2018 1559 Full Code [id number]  Georgette Shell, MD ED   12/19/2017 1726 12/23/2017 1453 DNR [id number]  Alma Friendly, MD Inpatient   10/17/2016 1231 10/21/2016 1911 Full Code [id number]  Lavina Hamman, MD Inpatient   09/20/2016 1009 09/21/2016 1229 Full Code [id number]  Franchot Gallo, MD Inpatient   03/03/2015 1929 03/05/2015 1428 Full Code [id number]  Gary Fleet, PA-C Inpatient   Advance Care Planning Activity    Advance Directive Documentation     Most Recent Value  Type  of Advance Directive Healthcare Power of Attorney  Pre-existing out of facility DNR order (yellow form or pink MOST form) --  "MOST" Form in Place? --        IV Access:    Peripheral IV   Procedures and diagnostic studies:   No results found.   Medical Consultants:    None.  Anti-Infectives:   kelfex  Subjective:    [name] Rose tolerating his diet continue to have watery stools.  Denies any abdominal pain  Objective:    Vitals:   02/13/20 1704 02/13/20 2144 02/14/20 0536 02/14/20 0625  BP: 107/71 135/82  119/82  Pulse: (!) 50 93  97  Resp: $Remo'20 20  20  'MVoOZ$ Temp: 97.7 F (36.5 C) 98 F (36.7 C)  97.7 F (36.5 C)  TempSrc: Oral Oral  Oral  SpO2: 99% 93%  100%  Weight:   127.9 kg   Height:       SpO2: 100 %   Intake/Output Summary (Last 24 hours) at 02/14/2020 0835 Last data filed at 02/14/2020 0827 Gross per 24 hour  Intake 1200 ml  Output 400 ml  Net 800 ml   Filed Weights   02/12/20 0507 02/13/20 0011 02/14/20 0536  Weight: 118.8 kg 124.3 kg 127.9 kg    Exam: General exam: In no acute distress. Respiratory system: Good air movement and clear to auscultation. Cardiovascular system: S1 & S2 heard, RRR. No JVD. Gastrointestinal system: Abdomen is nondistended, soft and nontender.  Extremities: No pedal edema. Skin: No rashes, lesions or ulcers Psychiatry: Judgement and insight appear normal. Mood & affect appropriate.   Data Reviewed:    Labs: Basic Metabolic Panel: Recent Labs  Lab 02/09/20 0405 02/09/20 0405 02/10/20 0414 02/10/20 0414 02/11/20 0326 02/11/20 0326 02/12/20 0850 02/13/20 1450  NA 137  --  140   145  --  142  --  145 138  K 2.4*   < > 2.8*   2.8*   < > 3.5   < > 3.3* 2.8*  CL 98  --  97*   98  --  99  --  104 105  CO2 23  --  25   26  --  23  --  22 20*  GLUCOSE 165*  --  115*   113*  --  126*  --  95 156*  BUN 99*  --  99*   85*  --  104*  --  123* 105*  CREATININE 6.82*  --  8.26*   8.13*  --  9.21*  --  9.84*  8.57*  CALCIUM 7.7*  --  7.4*   8.1*  --  7.2*  --  7.4* 6.9*  MG  --   --  2.3  --   --   --   --   --   PHOS  --   --  5.0*  --   --   --   --   --    < > =  values in this interval not displayed.   GFR Estimated Creatinine Clearance: 11.7 mL/min (A) (by C-G formula based on SCr of 8.57 mg/dL (H)). Liver Function Tests: Recent Labs  Lab 02/08/20 0411 02/09/20 0405 02/10/20 0414 02/11/20 0326 02/12/20 0850  AST 14* $Remov'17 20 20 23  'ZsApBN$ ALT 9 '5 5 5 5  '$ ALKPHOS 44 45 44 47 48  BILITOT 0.7 0.7 0.7 1.1 0.9  PROT 4.9* 4.6* 4.5* 4.3* 4.3*  ALBUMIN 2.3* 2.1* 2.1*   2.3* 2.2* 2.0*   No results for input(s): LIPASE, AMYLASE in the last 168 hours. No results for input(s): AMMONIA in the last 168 hours. Coagulation profile No results for input(s): INR, PROTIME in the last 168 hours. COVID-19 Labs  No results for input(s): DDIMER, FERRITIN, LDH, CRP in the last 72 hours.  Lab Results  Component Value Date   SARSCOV2NAA NEGATIVE 02/03/2020   [place] NEGATIVE 09/22/2019   [place] NEGATIVE 08/02/2019   [place] NEGATIVE 05/15/2019    CBC: Recent Labs  Lab 02/10/20 0414 02/10/20 1820 02/11/20 0326 02/12/20 0850 02/13/20 0802  WBC 8.4 9.6 9.9 7.6 7.4  NEUTROABS 5.5 7.3 7.0 5.2 5.0  HGB 9.5* 9.2* 8.1* 7.9* 8.2*  HCT 27.0* 27.2* 24.2* 24.0* 24.9*  MCV 96.1 97.1 98.8 100.8* 100.8*  PLT 25* 34* 35* 36* 41*   Cardiac Enzymes: No results for input(s): CKTOTAL, CKMB, CKMBINDEX, TROPONINI in the last 168 hours. BNP (last 3 results) No results for input(s): PROBNP in the last 8760 hours. CBG: Recent Labs  Lab 02/08/20 0738 02/08/20 1125 02/08/20 1632 02/08/20 2106 02/09/20 0716  GLUCAP 117* 116* 135* 122* 171*   D-Dimer: No results for input(s): DDIMER in the last 72 hours. Hgb A1c: No results for input(s): HGBA1C in the last 72 hours. Lipid Profile: No results for input(s): CHOL, HDL, LDLCALC, TRIG, CHOLHDL, LDLDIRECT in the last 72 hours. Thyroid function  studies: No results for input(s): TSH, T4TOTAL, T3FREE, THYROIDAB in the last 72 hours.  Invalid input(s): FREET3 Anemia work up: No results for input(s): VITAMINB12, FOLATE, FERRITIN, TIBC, IRON, RETICCTPCT in the last 72 hours. Sepsis Labs: Recent Labs  Lab 02/10/20 1820 02/11/20 0326 02/12/20 0850 02/13/20 0802  WBC 9.6 9.9 7.6 7.4   Microbiology Recent Results (from the past 240 hour(s))  C Difficile Quick Screen w PCR reflex     Status: None   Collection Time: 02/04/20  5:07 PM   Specimen: STOOL  Result Value Ref Range Status   C Diff antigen NEGATIVE NEGATIVE Final   C Diff toxin NEGATIVE NEGATIVE Final   C Diff interpretation No C. difficile detected.  Final    Comment: Performed at [facility], [place] [address], [place], [place] [address]  Gastrointestinal Panel by PCR , Stool     Status: None   Collection Time: 02/06/20 10:54 AM   Specimen: Stool  Result Value Ref Range Status   Campylobacter species NOT DETECTED NOT DETECTED Final   Plesimonas shigelloides NOT DETECTED NOT DETECTED Final   Salmonella species NOT DETECTED NOT DETECTED Final   Yersinia enterocolitica NOT DETECTED NOT DETECTED Final   Vibrio species NOT DETECTED NOT DETECTED Final   Vibrio cholerae NOT DETECTED NOT DETECTED Final   Enteroaggregative E coli (EAEC) NOT DETECTED NOT DETECTED Final   Enteropathogenic E coli (EPEC) NOT DETECTED NOT DETECTED Final   Enterotoxigenic E coli (ETEC) NOT DETECTED NOT DETECTED Final   Shiga like toxin producing E coli (STEC) NOT DETECTED NOT DETECTED Final   Shigella/Enteroinvasive  E coli (EIEC) NOT DETECTED NOT DETECTED Final   Cryptosporidium NOT DETECTED NOT DETECTED Final   Cyclospora cayetanensis NOT DETECTED NOT DETECTED Final   Entamoeba histolytica NOT DETECTED NOT DETECTED Final   Giardia lamblia NOT DETECTED NOT DETECTED Final   Adenovirus F40/41 NOT DETECTED NOT DETECTED Final   Astrovirus NOT DETECTED NOT DETECTED Final    Norovirus GI/GII NOT DETECTED NOT DETECTED Final   Rotavirus A NOT DETECTED NOT DETECTED Final   Sapovirus (I, II, IV, and V) NOT DETECTED NOT DETECTED Final    Comment: Performed at [facility], [place] [place], [place] [address]  Culture, blood (Routine X 2) w Reflex to ID Panel     Status: None   Collection Time: 02/09/20 10:48 AM   Specimen: BLOOD  Result Value Ref Range Status   Specimen Description   Final    BLOOD LEFT ARM Performed at [place] [address], [place], [place] [address]    Special Requests   Final    BOTTLES DRAWN AEROBIC AND ANAEROBIC Blood Culture adequate volume Performed at [place] [address], [place], [place] [address]    Culture   Final    NO GROWTH 5 DAYS Performed at [facility], [place] [address] [place], [place] [address]    Report Status 02/14/2020 FINAL  Final  Culture, blood (Routine X 2) w Reflex to ID Panel     Status: None   Collection Time: 02/09/20 10:50 AM   Specimen: BLOOD  Result Value Ref Range Status   Specimen Description   Final    BLOOD LEFT ARM Performed at [place] [address], [place], [place] [address]    Special Requests   Final    BOTTLES DRAWN AEROBIC AND ANAEROBIC Blood Culture adequate volume Performed at [facility] [address], [place], [place] [address]    Culture   Final    NO GROWTH 5 DAYS Performed at [facility], [place] [address], [place],  [address]    Report Status 02/14/2020 FINAL  Final     Medications:    sodium chloride   Intravenous Once   cephALEXin  500 mg Oral Q12H   Chlorhexidine Gluconate Cloth  6 each Topical Daily   metoprolol tartrate  50 mg Oral BID   oxybutynin  10 mg Oral QHS   pantoprazole (PROTONIX) IV  40 mg Intravenous Q12H   sodium chloride flush  10-40 mL Intracatheter Q12H   Continuous Infusions:     LOS: 11 days   Charlynne Cousins  [facility]  02/14/2020, 8:35 AM

## 2020-02-14 NOTE — Progress Notes (Signed)
ANTICOAGULATION CONSULT NOTE - Initial Consult  Pharmacy Consult for Apixaban , resume on 11/5 Indication: nonvalvular atrial fibrillation  No Known Allergies  Patient Measurements: Height: 6\' 2"  (188 cm) Weight: 127.9 kg (282 lb) IBW/kg (Calculated) : 82.2 kg   Vital Signs: Temp: 97.7 F (36.5 C) (11/04 0625) Temp Source: Oral (11/04 0625) BP: 119/82 (11/04 0625) Pulse Rate: 97 (11/04 0625)  Labs: Recent Labs    02/12/20 0850 02/12/20 0850 02/13/20 0802 02/13/20 1450 02/14/20 0906  HGB 7.9*   < > 8.2*  --  8.0*  HCT 24.0*  --  24.9*  --  24.0*  PLT 36*  --  41*  --  44*  CREATININE 9.84*  --   --  8.57* 8.66*   < > = values in this interval not displayed.    Estimated Creatinine Clearance: 11.6 mL/min (A) (by C-G formula based on SCr of 8.66 mg/dL (H)).   Medical History: Past Medical History:  Diagnosis Date   Anemia    Arthritis    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 ([place]) 09/29/2016   Bladder tumor    Chronic kidney disease    ckd stage 3, sees dr every 6 months, arf hemodialysis done x 2 2020   Dyspnea    with exertion   Dysrhythmia    atrial fib   Essential hypertension 06/23/2018   Goals of care, counseling/discussion 09/30/2016   History of prostate cancer followed by pcp dr [name]-  per pt last PSA undetectable   dx 2008-- (Stage T1c, Gleason 3+3,  PSA 4.58, vol 99cc)  s/p  radical prostatectomy (nerve sparing bilateral)    Hyperglycemia 06/23/2018   Hypertension    Mild hyperlipidemia 06/23/2018   Pre-diabetes    Wears glasses     Assessment: 69 y.o male on Elquis PTA for Afib. PLTC 30k on admit 10/24, then dropped to 15K on 10/25.  Have been holding PTA Eliquis as inpatient due to acute thrombocytopenia and possible GIB.   Onc/ Heme [name] recommended to hold eliquis until PLTC at least > 30K. PLTC increased to 35 k s/p plt transfusion 10/31 and has continued to improve to 44K today.  Cardiologist noted  plan to restart low dose Eliquis and  has consulted pharmacy to resume Eliquis on 11/5 Friday.   Goal of Therapy:  Stroke prevention Monitor platelets by anticoagulation protocol: Yes   Plan:  Tomorrow 02/15/20 resume Eliquis at low dose 2.5 mg PO BID.  Follow up PLTC in AM.  CBC daily   Nicole Cella, RPh Clinical Pharmacist [phone] til 3:30PM Please check AMION for all [place] phone numbers After 10:00 PM, call [place] [phone] 02/14/2020,10:29 AM

## 2020-02-15 DIAGNOSIS — I4891 Unspecified atrial fibrillation: Secondary | ICD-10-CM | POA: Diagnosis not present

## 2020-02-15 LAB — CBC WITH DIFFERENTIAL/PLATELET
Abs Immature Granulocytes: 0.13 10*3/uL — ABNORMAL HIGH (ref 0.00–0.07)
Basophils Absolute: 0 10*3/uL (ref 0.0–0.1)
Basophils Relative: 0 %
Eosinophils Absolute: 0.3 10*3/uL (ref 0.0–0.5)
Eosinophils Relative: 4 %
HCT: 22.7 % — ABNORMAL LOW (ref 39.0–52.0)
Hemoglobin: 7.4 g/dL — ABNORMAL LOW (ref 13.0–17.0)
Immature Granulocytes: 2 %
Lymphocytes Relative: 8 %
Lymphs Abs: 0.5 10*3/uL — ABNORMAL LOW (ref 0.7–4.0)
MCH: 32.9 pg (ref 26.0–34.0)
MCHC: 32.6 g/dL (ref 30.0–36.0)
MCV: 100.9 fL — ABNORMAL HIGH (ref 80.0–100.0)
Monocytes Absolute: 0.9 10*3/uL (ref 0.1–1.0)
Monocytes Relative: 14 %
Neutro Abs: 4.7 10*3/uL (ref 1.7–7.7)
Neutrophils Relative %: 72 %
Platelets: 53 10*3/uL — ABNORMAL LOW (ref 150–400)
RBC: 2.25 MIL/uL — ABNORMAL LOW (ref 4.22–5.81)
RDW: 15.3 % (ref 11.5–15.5)
WBC: 6.6 10*3/uL (ref 4.0–10.5)
nRBC: 0 % (ref 0.0–0.2)

## 2020-02-15 LAB — BASIC METABOLIC PANEL
Anion gap: 11 (ref 5–15)
BUN: 88 mg/dL — ABNORMAL HIGH (ref 8–23)
CO2: 17 mmol/L — ABNORMAL LOW (ref 22–32)
Calcium: 7.2 mg/dL — ABNORMAL LOW (ref 8.9–10.3)
Chloride: 109 mmol/L (ref 98–111)
Creatinine, Ser: 8.42 mg/dL — ABNORMAL HIGH (ref 0.61–1.24)
GFR, Estimated: 6 mL/min — ABNORMAL LOW (ref >60)
Glucose, Bld: 113 mg/dL — ABNORMAL HIGH (ref 70–99)
Potassium: 4 mmol/L (ref 3.5–5.1)
Sodium: 137 mmol/L (ref 135–145)

## 2020-02-15 MED ORDER — CEPHALEXIN 250 MG PO CAPS
500.0000 mg | ORAL_CAPSULE | Freq: Two times a day (BID) | ORAL | Status: AC
  Administered 2020-02-15 – 2020-02-16 (×3): 500 mg via ORAL
  Filled 2020-02-15 (×3): qty 2

## 2020-02-15 MED ORDER — APIXABAN 5 MG PO TABS
5.0000 mg | ORAL_TABLET | Freq: Two times a day (BID) | ORAL | Status: DC
  Administered 2020-02-15 – 2020-02-17 (×5): 5 mg via ORAL
  Filled 2020-02-15 (×6): qty 1

## 2020-02-15 NOTE — Progress Notes (Signed)
[facility] PROGRESS NOTE    Progress Note  [name] Rose  SWN:[id number] DOB: 1950/11/18 DOA: 02/03/2020 PCP: Shirline Frees, MD     Brief Narrative:   [name] Rose is an 69 y.o. male past medical history significant for metastatic bladder cancer currently on chemotherapy, paroxysmal atrial fibrillation, chronic kidney disease stage 3 comes into the ED for generalized weakness and diarrhea that started 4 days prior to admission.  On admission he was found to be septic with neutropenia with started on antibiotics oncology was consulted blood cultures grew Klebsiella.  Hospital course complicated by acute kidney injury and rectal and NG tube bleeding on 02/10/2020 he was transfused 1 unit of red blood cells, was started on IV Protonix, subsequently developed ileus which is slowly improving.  Assessment/Plan:   Acute renal failure with acute tubular necrosis superimposed on stage 3 chronic kidney disease ([place]): In the setting of sepsis and bladder cancer on chemo. Leading to possible ATN, probably needs time for his ATN to resolve. Creatinine on admission around 2.5 it peaked at 9.8, creatinine this morning is 8.2. No uremic symptoms.  Vomiting and diarrhea after chemotherapy now resolved, with ileus: X-ray was improved on 02/12/2020. Having watery diarrhea, no abdominal pain will start on Metamucil and 2 doses of Lomotil.  Klebsiella bacteremia/severe sepsis: He was started empirically on IV antibiotics now on orals will complete his antibiotics tomorrow. Has remained afebrile with no leukocytosis continue Keflex.  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 ([place]) Appreciate oncology's assistance.  Acute thrombocytopenia: Related to chemotherapy in the setting of sepsis. Status post 1 unit of platelets platelets continue to improve.  GI bleed/chemotherapy-induced anemia: In the setting of thrombocytopenia and NG tube.  He has subsided. Status post 2  units of packed red blood cells on 02/04/2020. Change Protonix to oral. Continue to monitor hemoglobin closely if below 7 or symptomatic we will go ahead and transfuse 1 unit  A. fib with RVR: With a chads Vascor greater than 2. Currently rate controlled on Eliquis was held due to GI bleed and thrombocytopenia. Tikosyn has also been held due to acute kidney injury. Continue metoprolol and Cardizem. Can probably resume Eliquis in 3 to 4 days.  Hypokalemia: Replete orally basic metabolic panel is pending this morning.  DVT prophylaxis: scd Family Communication:none Status is: Inpatient  Remains inpatient appropriate because:Hemodynamically unstable   Dispo: The patient is from: Home              Anticipated d/c is to: Home              Anticipated d/c date is: 3 days              Patient currently is not medically stable to d/c.    Code Status:     Code Status Orders  (From admission, onward)         Start     Ordered   02/03/20 1433  Full code  Continuous        02/03/20 1432        Code Status History    Date Active Date Inactive Code Status Order ID Comments User Context   09/25/2019 1405 09/28/2019 2249 Full Code [id number]  Sherran Needs, NP Inpatient   09/08/2018 1209 10/14/2018 1649 Full Code [id number]  Mariel Aloe, MD Inpatient   06/21/2018 1713 06/29/2018 1559 Full Code [id number]  Georgette Shell, MD ED   12/19/2017 1726 12/23/2017 1453 DNR [id number]  Alma Friendly,  MD Inpatient   10/17/2016 1231 10/21/2016 1911 Full Code [id number]  Lavina Hamman, MD Inpatient   09/20/2016 1009 09/21/2016 1229 Full Code [id number]  Franchot Gallo, MD Inpatient   03/03/2015 1929 03/05/2015 1428 Full Code [id number]  Gary Fleet, PA-C Inpatient   Advance Care Planning Activity    Advance Directive Documentation     Most Recent Value  Type of Advance Directive Healthcare Power of Attorney  Pre-existing out of facility DNR order (yellow form or pink MOST form) --   "MOST" Form in Place? --        IV Access:    Peripheral IV   Procedures and diagnostic studies:   No results found.   Medical Consultants:    None.  Anti-Infectives:   kelfex  Subjective:    [name] Rose denies abdominal pain diarrhea is improving tolerating his diet.  Objective:    Vitals:   02/14/20 0625 02/14/20 1131 02/14/20 1937 02/15/20 0335  BP: 119/82 111/68 (!) 110/98   Pulse: 97 87 86   Resp: $Remo'20 18 18   'JDeIU$ Temp: 97.7 F (36.5 C) 97.6 F (36.4 C) 97.6 F (36.4 C)   TempSrc: Oral Oral Oral   SpO2: 100% 100% 97%   Weight:    127.6 kg  Height:       SpO2: 97 %   Intake/Output Summary (Last 24 hours) at 02/15/2020 0847 Last data filed at 02/15/2020 0803 Gross per 24 hour  Intake 714 ml  Output 1600 ml  Net -886 ml   Filed Weights   02/13/20 0011 02/14/20 0536 02/15/20 0335  Weight: 124.3 kg 127.9 kg 127.6 kg    Exam: General exam: In no acute distress. Respiratory system: Good air movement and clear to auscultation. Cardiovascular system: S1 & S2 heard, RRR. No JVD. Gastrointestinal system: Abdomen is nondistended, soft and nontender.  Extremities: No pedal edema. Skin: No rashes, lesions or ulcers  Data Reviewed:    Labs: Basic Metabolic Panel: Recent Labs  Lab 02/10/20 0414 02/10/20 0414 02/11/20 0326 02/11/20 0326 02/12/20 0850 02/12/20 0850 02/13/20 1450 02/13/20 1450 02/14/20 0906 02/15/20 0131  NA 140  145   < > 142  --  145  --  138  --  139 137  K 2.8*  2.8*   < > 3.5   < > 3.3*   < > 2.8*   < > 3.0* 4.0  CL 97*  98   < > 99  --  104  --  105  --  108 109  CO2 25  26   < > 23  --  22  --  20*  --  19* 17*  GLUCOSE 115*  113*   < > 126*  --  95  --  156*  --  128* 113*  BUN 99*  85*   < > 104*  --  123*  --  105*  --  93* 88*  CREATININE 8.26*  8.13*   < > 9.21*  --  9.84*  --  8.57*  --  8.66* 8.42*  CALCIUM 7.4*  8.1*   < > 7.2*  --  7.4*  --  6.9*  --  7.1* 7.2*  MG 2.3  --   --   --   --   --    --   --   --   --   PHOS 5.0*  --   --   --   --   --   --   --   --   --    < > =  values in this interval not displayed.   GFR Estimated Creatinine Clearance: 11.9 mL/min (A) (by C-G formula based on SCr of 8.42 mg/dL (H)). Liver Function Tests: Recent Labs  Lab 02/09/20 0405 02/10/20 0414 02/11/20 0326 02/12/20 0850  AST $Re'17 20 20 23  'XBN$ ALT '5 5 5 5  '$ ALKPHOS 45 44 47 48  BILITOT 0.7 0.7 1.1 0.9  PROT 4.6* 4.5* 4.3* 4.3*  ALBUMIN 2.1* 2.1*  2.3* 2.2* 2.0*   No results for input(s): LIPASE, AMYLASE in the last 168 hours. No results for input(s): AMMONIA in the last 168 hours. Coagulation profile No results for input(s): INR, PROTIME in the last 168 hours. COVID-19 Labs  No results for input(s): DDIMER, FERRITIN, LDH, CRP in the last 72 hours.  Lab Results  Component Value Date   SARSCOV2NAA NEGATIVE 02/03/2020   [place] NEGATIVE 09/22/2019   [place] NEGATIVE 08/02/2019   [place] NEGATIVE 05/15/2019    CBC: Recent Labs  Lab 02/11/20 0326 02/12/20 0850 02/13/20 0802 02/14/20 0906 02/15/20 0131  WBC 9.9 7.6 7.4 6.7 6.6  NEUTROABS 7.0 5.2 5.0 4.5 4.7  HGB 8.1* 7.9* 8.2* 8.0* 7.4*  HCT 24.2* 24.0* 24.9* 24.0* 22.7*  MCV 98.8 100.8* 100.8* 100.8* 100.9*  PLT 35* 36* 41* 44* 53*   Cardiac Enzymes: No results for input(s): CKTOTAL, CKMB, CKMBINDEX, TROPONINI in the last 168 hours. BNP (last 3 results) No results for input(s): PROBNP in the last 8760 hours. CBG: Recent Labs  Lab 02/08/20 1125 02/08/20 1632 02/08/20 2106 02/09/20 0716  GLUCAP 116* 135* 122* 171*   D-Dimer: No results for input(s): DDIMER in the last 72 hours. Hgb A1c: No results for input(s): HGBA1C in the last 72 hours. Lipid Profile: No results for input(s): CHOL, HDL, LDLCALC, TRIG, CHOLHDL, LDLDIRECT in the last 72 hours. Thyroid function studies: No results for input(s): TSH, T4TOTAL, T3FREE, THYROIDAB in the last 72 hours.  Invalid input(s): FREET3 Anemia work up: No  results for input(s): VITAMINB12, FOLATE, FERRITIN, TIBC, IRON, RETICCTPCT in the last 72 hours. Sepsis Labs: Recent Labs  Lab 02/12/20 0850 02/13/20 0802 02/14/20 0906 02/15/20 0131  WBC 7.6 7.4 6.7 6.6   Microbiology Recent Results (from the past 240 hour(s))  Gastrointestinal Panel by PCR , Stool     Status: None   Collection Time: 02/06/20 10:54 AM   Specimen: Stool  Result Value Ref Range Status   Campylobacter species NOT DETECTED NOT DETECTED Final   Plesimonas shigelloides NOT DETECTED NOT DETECTED Final   Salmonella species NOT DETECTED NOT DETECTED Final   Yersinia enterocolitica NOT DETECTED NOT DETECTED Final   Vibrio species NOT DETECTED NOT DETECTED Final   Vibrio cholerae NOT DETECTED NOT DETECTED Final   Enteroaggregative E coli (EAEC) NOT DETECTED NOT DETECTED Final   Enteropathogenic E coli (EPEC) NOT DETECTED NOT DETECTED Final   Enterotoxigenic E coli (ETEC) NOT DETECTED NOT DETECTED Final   Shiga like toxin producing E coli (STEC) NOT DETECTED NOT DETECTED Final   Shigella/Enteroinvasive E coli (EIEC) NOT DETECTED NOT DETECTED Final   Cryptosporidium NOT DETECTED NOT DETECTED Final   Cyclospora cayetanensis NOT DETECTED NOT DETECTED Final   Entamoeba histolytica NOT DETECTED NOT DETECTED Final   Giardia lamblia NOT DETECTED NOT DETECTED Final   Adenovirus F40/41 NOT DETECTED NOT DETECTED Final   Astrovirus NOT DETECTED NOT DETECTED Final   Norovirus GI/GII NOT DETECTED NOT DETECTED Final   Rotavirus A NOT DETECTED NOT DETECTED Final   Sapovirus (I, II, IV, and V) NOT DETECTED  NOT DETECTED Final    Comment: Performed at [facility], [address] [place], [place] [address]  Culture, blood (Routine X 2) w Reflex to ID Panel     Status: None   Collection Time: 02/09/20 10:48 AM   Specimen: BLOOD  Result Value Ref Range Status   Specimen Description   Final    BLOOD LEFT ARM Performed at [facility] [address]  [place], [place] [address]    Special Requests   Final    BOTTLES DRAWN AEROBIC AND ANAEROBIC Blood Culture adequate volume Performed at [facility] [address] [place], [place] [address]    Culture   Final    NO GROWTH 5 DAYS Performed at [facility], [place] [address] [place], [place] [address]    Report Status 02/14/2020 FINAL  Final  Culture, blood (Routine X 2) w Reflex to ID Panel     Status: None   Collection Time: 02/09/20 10:50 AM   Specimen: BLOOD  Result Value Ref Range Status   Specimen Description   Final    BLOOD LEFT ARM Performed at [facility] [address], [place], [place] [address]    Special Requests   Final    BOTTLES DRAWN AEROBIC AND ANAEROBIC Blood Culture adequate volume Performed at [facility] [address] [place], [place] [address]    Culture   Final    NO GROWTH 5 DAYS Performed at [facility], [place] [address] [place],  [address]    Report Status 02/14/2020 FINAL  Final     Medications:   . sodium chloride   Intravenous Once  . apixaban  5 mg Oral BID  . cephALEXin  500 mg Oral Q12H  . Chlorhexidine Gluconate Cloth  6 each Topical Daily  . metoprolol tartrate  50 mg Oral BID  . oxybutynin  10 mg Oral QHS  . pantoprazole  40 mg Oral Daily  . psyllium  1 packet Oral Daily  . sodium chloride flush  10-40 mL Intracatheter Q12H   Continuous Infusions:     LOS: 12 days   Charlynne Cousins  [facility]  02/15/2020, 8:47 AM

## 2020-02-15 NOTE — Progress Notes (Signed)
Physical Therapy Treatment Patient Details Name: [name] Rose MRN: [id number] DOB: October 07, 1950 Today's Date: 02/15/2020    History of Present Illness 69 yo male admitted with FTT, pancytopenia, thrombocytopenia, sepsis, weakness. hx of met prostate ca with bladder mets, Afib, CKD    PT Comments    Patient progressing towards physical therapy goals. Patient with improved gait speed this session, still requires min guard for safety and RW. Dynamic standing balance sink side to brush teeth and dispose of trash under sink with min guard for safety. Performed functional exercises seated for strengthening. Encouraged patient to ambulate with staff over weekend to continue building endurance and strength. Continue to recommend HHPT following discharge. PT will continue to follow.     Follow Up Recommendations  Home health PT;Supervision/Assistance - 24 hour     Equipment Recommendations  None recommended by PT    Recommendations for Other Services       Precautions / Restrictions Precautions Precautions: Fall Restrictions Weight Bearing Restrictions: No    Mobility  Bed Mobility Overal bed mobility: Needs Assistance Bed Mobility: Supine to Sit     Supine to sit: Supervision        Transfers Overall transfer level: Needs assistance Equipment used: Rolling [name] Rose (2 wheeled) Transfers: Sit to/from Stand Sit to Stand: Min guard            Ambulation/Gait Ambulation/Gait assistance: Min guard Gait Distance (Feet): 25 Feet Assistive device: Rolling [name] Rose (2 wheeled) Gait Pattern/deviations: Step-through pattern;Decreased stride length     General Gait Details: improved speed, pt still hesitant about walking in hallway due to bowel incontinence    Stairs             Wheelchair Mobility    Modified Rankin (Stroke Patients Only)       Balance Overall balance assessment: Needs assistance Sitting-balance support: No upper extremity supported;Feet  supported Sitting balance-Leahy Scale: Good     Standing balance support: Bilateral upper extremity supported;During functional activity Standing balance-Leahy Scale: Poor Standing balance comment: standing sink side brushing teeth with single UE support requires min guard for safety                            Cognition Arousal/Alertness: Awake/alert Behavior During Therapy: WFL for tasks assessed/performed Overall Cognitive Status: Within Functional Limits for tasks assessed                                        Exercises General Exercises - Lower Extremity Long Arc Quad: AROM;5 reps;Seated Hip Flexion/Marching: AROM;Both;10 reps;Seated    General Comments        Pertinent Vitals/Pain Pain Assessment: Faces Faces Pain Scale: Hurts a little bit Pain Location: general Pain Descriptors / Indicators: Sore Pain Intervention(s): Monitored during session;Repositioned    Home Living                      Prior Function            PT Goals (current goals can now be found in the care plan section) Acute Rehab PT Goals Patient Stated Goal: get stronger and go home PT Goal Formulation: With patient Time For Goal Achievement: 02/19/20 Potential to Achieve Goals: Good Progress towards PT goals: Progressing toward goals    Frequency    Min 3X/week      PT Plan  Current plan remains appropriate    Co-evaluation              AM-PAC PT "6 Clicks" Mobility   Outcome Measure  Help needed turning from your back to your side while in a flat bed without using bedrails?: None Help needed moving from lying on your back to sitting on the side of a flat bed without using bedrails?: None Help needed moving to and from a bed to a chair (including a wheelchair)?: A Little Help needed standing up from a chair using your arms (e.g., wheelchair or bedside chair)?: A Little Help needed to walk in hospital room?: A Little Help needed climbing  3-5 steps with a railing? : A Lot 6 Click Score: 19    End of Session Equipment Utilized During Treatment: Gait belt Activity Tolerance: Patient tolerated treatment well Patient left: in chair;with call bell/phone within reach;with nursing/sitter in room Nurse Communication: Mobility status PT Visit Diagnosis: Muscle weakness (generalized) (M62.81);History of falling (Z91.81);Repeated falls (R29.6)     Time: 4585-9292 PT Time Calculation (min) (ACUTE ONLY): 24 min  Charges:  $Therapeutic Exercise: 8-22 mins $Therapeutic Activity: 8-22 mins                     [name] Rose, PT, DPT Acute Rehabilitation Services Pager [phone] Office [phone]    [name] Rose 02/15/2020, 12:21 PM

## 2020-02-15 NOTE — Care Management Important Message (Signed)
Important Message  Patient Details  Name: [name] Rose MRN: [id number] Date of Birth: 1951/03/11   Medicare Important Message Given:  Yes     Shelda Altes 02/15/2020, 8:40 AM

## 2020-02-15 NOTE — Progress Notes (Signed)
Progress Note  Patient Name: [name] Rose Date of Encounter: 02/15/2020  [facility] Cardiologist: Pixie Casino, MD   Subjective   Attitude better no cardiac symptoms taking PO  Inpatient Medications    Scheduled Meds: . sodium chloride   Intravenous Once  . apixaban  2.5 mg Oral BID  . cephALEXin  500 mg Oral Q12H  . Chlorhexidine Gluconate Cloth  6 each Topical Daily  . metoprolol tartrate  50 mg Oral BID  . oxybutynin  10 mg Oral QHS  . pantoprazole  40 mg Oral Daily  . psyllium  1 packet Oral Daily  . sodium chloride flush  10-40 mL Intracatheter Q12H   Continuous Infusions:  PRN Meds: acetaminophen **OR** [DISCONTINUED] acetaminophen, diphenhydrAMINE, diphenoxylate-atropine, melatonin, ondansetron **OR** ondansetron (ZOFRAN) IV, sodium chloride flush   Vital Signs    Vitals:   02/14/20 0625 02/14/20 1131 02/14/20 1937 02/15/20 0335  BP: 119/82 111/68 (!) 110/98   Pulse: 97 87 86   Resp: $Remo'20 18 18   'NRavW$ Temp: 97.7 F (36.5 C) 97.6 F (36.4 C) 97.6 F (36.4 C)   TempSrc: Oral Oral Oral   SpO2: 100% 100% 97%   Weight:    127.6 kg  Height:        Intake/Output Summary (Last 24 hours) at 02/15/2020 0827 Last data filed at 02/15/2020 0803 Gross per 24 hour  Intake 714 ml  Output 1600 ml  Net -886 ml   Last 3 Weights 02/15/2020 02/14/2020 02/13/2020  Weight (lbs) 281 lb 3.2 oz 282 lb 274 lb  Weight (kg) 127.551 kg 127.914 kg 124.286 kg      Telemetry    Atrial fibrillation with rates mostly in the 80's to 110's but as high as the 150's to 160's at times. - Personally Reviewed  ECG    No new ECG tracing today. - Personally Reviewed  Physical Exam   Affect appropriate Overweight white male  HEENT: normal Neck supple with no adenopathy JVP normal no bruits no thyromegaly Lungs clear with no wheezing and good diaphragmatic motion Heart:  S1/S2 SEM  murmur, no rub, gallop or click PMI normal Abdomen: benighn, BS positve, no tenderness, no  AAA no bruit.  No HSM or HJR Distal pulses intact with no bruits No edema Neuro non-focal Skin warm and dry No muscular weakness   Labs    High Sensitivity Troponin:  No results for input(s): TROPONINIHS in the last 720 hours.    Chemistry Recent Labs  Lab 02/10/20 0414 02/10/20 0414 02/11/20 0326 02/11/20 0326 02/12/20 0850 02/12/20 0850 02/13/20 1450 02/14/20 0906 02/15/20 0131  NA 140  145   < > 142   < > 145   < > 138 139 137  K 2.8*  2.8*   < > 3.5   < > 3.3*   < > 2.8* 3.0* 4.0  CL 97*  98   < > 99   < > 104   < > 105 108 109  CO2 25  26   < > 23   < > 22   < > 20* 19* 17*  GLUCOSE 115*  113*   < > 126*   < > 95   < > 156* 128* 113*  BUN 99*  85*   < > 104*   < > 123*   < > 105* 93* 88*  CREATININE 8.26*  8.13*   < > 9.21*   < > 9.84*   < > 8.57* 8.66*  8.42*  CALCIUM 7.4*  8.1*   < > 7.2*   < > 7.4*   < > 6.9* 7.1* 7.2*  PROT 4.5*  --  4.3*  --  4.3*  --   --   --   --   ALBUMIN 2.1*  2.3*  --  2.2*  --  2.0*  --   --   --   --   AST 20  --  20  --  23  --   --   --   --   ALT 5  --  5  --  5  --   --   --   --   ALKPHOS 44  --  47  --  48  --   --   --   --   BILITOT 0.7  --  1.1  --  0.9  --   --   --   --   GFRNONAA 7*  7*   < > 6*   < > 5*   < > 6* 6* 6*  ANIONGAP 18*  21*   < > 20*   < > 19*   < > $R'13 12 11   'rQ$ < > = values in this interval not displayed.     Hematology Recent Labs  Lab 02/13/20 0802 02/14/20 0906 02/15/20 0131  WBC 7.4 6.7 6.6  RBC 2.47* 2.38* 2.25*  HGB 8.2* 8.0* 7.4*  HCT 24.9* 24.0* 22.7*  MCV 100.8* 100.8* 100.9*  MCH 33.2 33.6 32.9  MCHC 32.9 33.3 32.6  RDW 15.2 15.5 15.3  PLT 41* 44* 53*    BNPNo results for input(s): BNP, PROBNP in the last 168 hours.   DDimer No results for input(s): DDIMER in the last 168 hours.   Radiology    No results found.  Cardiac Studies   Echocardiogram 10/05/2019: Impressions:  1. Normal LV function; mild LVE; trace MR and TR.   2. Left ventricular ejection fraction, by  estimation, is 55 to 60%. The  left ventricle has normal function. The left ventricle has no regional  wall motion abnormalities. The left ventricular internal cavity size was  mildly dilated. Left ventricular  diastolic parameters were normal.   3. Right ventricular systolic function is normal. The right ventricular  size is normal. There is mildly elevated pulmonary artery systolic  pressure.   4. The mitral valve is normal in structure. Trivial mitral valve  regurgitation. No evidence of mitral stenosis.   5. The aortic valve has an indeterminant number of cusps. Aortic valve  regurgitation is not visualized. No aortic stenosis is present.   6. The inferior vena cava is normal in size with greater than 50%  respiratory variability, suggesting right atrial pressure of 3 mmHg.  Patient Profile     69 y.o. male with a history of persistent atrial fibrillation on Tikosyn and Eliquis, PVCs,  mitral regurgitation, hypertension, hyperlipidemia, metastatic bladder cancer to intra-abdominal lymph node on chemotherapy, chronic anemia, and CKD stage IV. Patient admitted with sepsis, ileus, and progressive AKI. Cardiology was consulted for evaluation of atrial fibrillation with RVR at the request of Dr. Tawanna Solo.  Assessment & Plan    Persistent Atrial Fibrillation - rate control ok tolerating low dose eliiqus will write for home dose starting tomorrow  Thrombocytopenia - improved 53 see above regarding resuming full dose eliquis    Acute on CKD Stage IV - still not on dialysis making urine no real uremic symptoms / rub Cr  finally starting to head in down direction 8.4 today ? D/c with close outpatient f/u by nephrology as he is making good urine    Otherwise, per primary team: - GI bleeding/anemia - Klebsiella pneumonia bacteremia - Bladder cancer  Cardiology will sign off  For questions or updates, please contact [place] Please consult [email or web address] for contact info under         Signed, Jenkins Rouge, MD  02/15/2020, 8:27 AM

## 2020-02-15 NOTE — Progress Notes (Signed)
[facility] Progress Note   69 y.o.year-oldafib, HTN, CKD 4baseline creat is 1.9- 2.6 from 2021and bladder cancer w/ mets on chemoRx presented on 10/24 w/ gen'd weakness, diarrhea for 5 days, no appetite or fevers. Consulted for AKI likely secondary to hypotension causing ATN.   Assessment/ Plan:   1. AoCKD 4 - Baseline creat is 1.9- 2.6 from 2021, eGFR 26- 34 ml/min. Creat 2.5 on admitand increase significantly likely due to ATN.  Continuing to improve slowly. -Oral hydration sufficient at this time.  Likely will just take time for ATN to resolve -No indications for dialysis at this time -Would feel better about discharge once patient's creatinine has consistently improved for a couple more days, ideally when creatinine is around 5  2. Klebs pna neutropenic sepsis -BP is improved, antibiotic treatment per primary team 3. Neutropenia/ pancytopenia - per pmd, ONC 4. Atrial fib -continue home rate controlling agents per primary team. 5. Metastatic bladder cancer - per pmd 6. Thrombocytopenia - some mild improvement 7. Hypokalemia: Continue oral replacement today as needed.  Potassium 4 today 8. Anemia: Multifactorial with a hemoglobin of 7.4.  Like to avoid ESA in malignancy.  Transfusions per primary team.  02/15/2020, 11:02 AM      Subjective:   Patient feels well today without any specific complaints.  Denies significant shortness of breath.  Feels like his urination has improved   Objective:   BP 125/88 (BP Location: Left Arm)   Pulse (!) 105   Temp 97.6 F (36.4 C) (Oral)   Resp 18   Ht '6\' 2"'  (1.88 m)   Wt 127.6 kg   SpO2 93%   BMI 36.10 kg/m   Intake/Output Summary (Last 24 hours) at 02/15/2020 1102 Last data filed at 02/15/2020 0850 Gross per 24 hour  Intake 934 ml  Output 1600 ml  Net -666 ml   Weight change: -0.363 kg  Physical Exam: Genalert,lying in bed, no distress Chest bilateral chest rise with no increased work of  breathing CVirreg irregrhythm, tachycardia Abd soft ntnd no mass or ascites, NGT Exttr trace pitting edema in the bilateral lower extremities Neuro is alert, Ox 3 , nf, no asterixis  Imaging: No results found.  Labs: BMET Recent Labs  Lab 02/09/20 0405 02/10/20 0414 02/11/20 0326 02/12/20 0850 02/13/20 1450 02/14/20 0906 02/15/20 0131  NA 137 140  145 142 145 138 139 137  K 2.4* 2.8*  2.8* 3.5 3.3* 2.8* 3.0* 4.0  CL 98 97*  98 99 104 105 108 109  CO2 '23 25  26 23 22 ' 20* 19* 17*  GLUCOSE 165* 115*  113* 126* 95 156* 128* 113*  BUN 99* 99*  85* 104* 123* 105* 93* 88*  CREATININE 6.82* 8.26*  8.13* 9.21* 9.84* 8.57* 8.66* 8.42*  CALCIUM 7.7* 7.4*  8.1* 7.2* 7.4* 6.9* 7.1* 7.2*  PHOS  --  5.0*  --   --   --   --   --    CBC Recent Labs  Lab 02/12/20 0850 02/13/20 0802 02/14/20 0906 02/15/20 0131  WBC 7.6 7.4 6.7 6.6  NEUTROABS 5.2 5.0 4.5 4.7  HGB 7.9* 8.2* 8.0* 7.4*  HCT 24.0* 24.9* 24.0* 22.7*  MCV 100.8* 100.8* 100.8* 100.9*  PLT 36* 41* 44* 53*    Medications:    . sodium chloride   Intravenous Once  . apixaban  5 mg Oral BID  . cephALEXin  500 mg Oral Q12H  . Chlorhexidine Gluconate Cloth  6 each Topical Daily  .  metoprolol tartrate  50 mg Oral BID  . oxybutynin  10 mg Oral QHS  . pantoprazole  40 mg Oral Daily  . psyllium  1 packet Oral Daily  . sodium chloride flush  10-40 mL Intracatheter Q12H

## 2020-02-15 NOTE — Progress Notes (Signed)
Had 10 bts Vtach, labs drawn at 013 1 were reviewed. Vs stable. Will monitor

## 2020-02-16 LAB — CBC WITH DIFFERENTIAL/PLATELET
Abs Immature Granulocytes: 0.14 10*3/uL — ABNORMAL HIGH (ref 0.00–0.07)
Basophils Absolute: 0 10*3/uL (ref 0.0–0.1)
Basophils Relative: 0 %
Eosinophils Absolute: 0.2 10*3/uL (ref 0.0–0.5)
Eosinophils Relative: 4 %
HCT: 19.6 % — ABNORMAL LOW (ref 39.0–52.0)
Hemoglobin: 6.3 g/dL — CL (ref 13.0–17.0)
Immature Granulocytes: 2 %
Lymphocytes Relative: 11 %
Lymphs Abs: 0.6 10*3/uL — ABNORMAL LOW (ref 0.7–4.0)
MCH: 32.6 pg (ref 26.0–34.0)
MCHC: 32.1 g/dL (ref 30.0–36.0)
MCV: 101.6 fL — ABNORMAL HIGH (ref 80.0–100.0)
Monocytes Absolute: 0.8 10*3/uL (ref 0.1–1.0)
Monocytes Relative: 13 %
Neutro Abs: 4 10*3/uL (ref 1.7–7.7)
Neutrophils Relative %: 70 %
Platelets: 58 10*3/uL — ABNORMAL LOW (ref 150–400)
RBC: 1.93 MIL/uL — ABNORMAL LOW (ref 4.22–5.81)
RDW: 15.4 % (ref 11.5–15.5)
WBC: 5.8 10*3/uL (ref 4.0–10.5)
nRBC: 0 % (ref 0.0–0.2)

## 2020-02-16 LAB — BASIC METABOLIC PANEL
Anion gap: 11 (ref 5–15)
BUN: 83 mg/dL — ABNORMAL HIGH (ref 8–23)
CO2: 18 mmol/L — ABNORMAL LOW (ref 22–32)
Calcium: 6.9 mg/dL — ABNORMAL LOW (ref 8.9–10.3)
Chloride: 107 mmol/L (ref 98–111)
Creatinine, Ser: 8.27 mg/dL — ABNORMAL HIGH (ref 0.61–1.24)
GFR, Estimated: 6 mL/min — ABNORMAL LOW (ref >60)
Glucose, Bld: 109 mg/dL — ABNORMAL HIGH (ref 70–99)
Potassium: 3.7 mmol/L (ref 3.5–5.1)
Sodium: 136 mmol/L (ref 135–145)

## 2020-02-16 LAB — PREPARE RBC (CROSSMATCH)

## 2020-02-16 LAB — CBC
HCT: 21 % — ABNORMAL LOW (ref 39.0–52.0)
Hemoglobin: 6.8 g/dL — CL (ref 13.0–17.0)
MCH: 33 pg (ref 26.0–34.0)
MCHC: 32.4 g/dL (ref 30.0–36.0)
MCV: 101.9 fL — ABNORMAL HIGH (ref 80.0–100.0)
Platelets: 60 10*3/uL — ABNORMAL LOW (ref 150–400)
RBC: 2.06 MIL/uL — ABNORMAL LOW (ref 4.22–5.81)
RDW: 15.3 % (ref 11.5–15.5)
WBC: 5.9 10*3/uL (ref 4.0–10.5)
nRBC: 0 % (ref 0.0–0.2)

## 2020-02-16 MED ORDER — GERHARDT'S BUTT CREAM
TOPICAL_CREAM | CUTANEOUS | Status: DC | PRN
  Filled 2020-02-16: qty 1

## 2020-02-16 MED ORDER — PANTOPRAZOLE SODIUM 40 MG PO TBEC
40.0000 mg | DELAYED_RELEASE_TABLET | Freq: Two times a day (BID) | ORAL | Status: DC
  Administered 2020-02-16 – 2020-02-17 (×4): 40 mg via ORAL
  Filled 2020-02-16 (×4): qty 1

## 2020-02-16 MED ORDER — SODIUM CHLORIDE 0.9% IV SOLUTION: Freq: Once | INTRAVENOUS | Status: AC

## 2020-02-16 MED ORDER — HYDROXYZINE HCL 10 MG PO TABS
10.0000 mg | ORAL_TABLET | Freq: Three times a day (TID) | ORAL | Status: DC | PRN
  Administered 2020-02-16 – 2020-02-24 (×2): 10 mg via ORAL
  Filled 2020-02-16 (×2): qty 1

## 2020-02-16 NOTE — Progress Notes (Signed)
From Dr Kyla Balzarine rounding note cardiology has signed off, please call us back if needed   Carlyle Dolly MD

## 2020-02-16 NOTE — Progress Notes (Signed)
CRITICAL VALUE ALERT  Critical Value:  hemoglobin 6.3  Date & Time Notied:  02/16/2020 0745  Provider Notified: Olevia Bowens MD

## 2020-02-16 NOTE — Plan of Care (Signed)
°  Problem: Education: Goal: Knowledge of General Education information will improve Description: Including pain rating scale, medication(s)/side effects and non-pharmacologic comfort measures Outcome: Progressing   Problem: Nutrition: Goal: Adequate nutrition will be maintained Outcome: Progressing   Problem: Nutrition: Goal: Adequate nutrition will be maintained Outcome: Progressing

## 2020-02-16 NOTE — Progress Notes (Signed)
[facility] PROGRESS NOTE    Progress Note  [name] Rose  YNW:[id number] DOB: 1951-03-28 DOA: 02/03/2020 PCP: Shirline Frees, MD     Brief Narrative:   [name] Rose is an 69 y.o. male past medical history significant for metastatic bladder cancer currently on chemotherapy, paroxysmal atrial fibrillation, chronic kidney disease stage 3 comes into the ED for generalized weakness and diarrhea that started 4 days prior to admission.  On admission he was found to be septic with neutropenia with started on antibiotics oncology was consulted blood cultures grew Klebsiella.  Hospital course complicated by acute kidney injury and rectal and NG tube bleeding on 02/10/2020 he was transfused 1 unit of red blood cells, was started on IV Protonix, subsequently developed ileus which is slowly improving.  Assessment/Plan:   Acute renal failure with acute tubular necrosis superimposed on stage 3 chronic kidney disease ([place]): In the setting of sepsis and bladder cancer on chemo. Leading to ATN, his creatinine this morning is about the same as 8.4. No uremic symptoms, further management per renal.  Vomiting and diarrhea after chemotherapy now resolved, with ileus: X-ray was improved on 02/12/2020. Started on Metamucil and Lomotil his diarrhea has improved.  Klebsiella bacteremia/severe sepsis: Started on IV antibiotics on admission has completed his course of antibiotics today on 02/16/2020.  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 ([place]) Appreciate oncology's assistance.  Acute thrombocytopenia: Related to chemotherapy in the setting of sepsis. Status post 1 unit of platelets platelets continue to improve.  GI bleed/chemotherapy-induced anemia: In the setting of thrombocytopenia and NG tube.  He has subsided. Status post 2 units of packed red blood cells on 02/04/2020. Change Protonix to oral. Hemoglobin has drifted down slowly, CBC this morning is pending.  A.  fib with RVR: With a chads Vascor greater than 2. Rate is currently controlled, Eliquis was held due to GI bleed and thrombocytopenia, Tikosyn is also being held due to acute kidney injury. His rate is controlled metoprolol and Cardizem. Can probably resume Eliquis on 02/19/2020  Hypokalemia: Replete orally basic metabolic panel is pending this morning.  DVT prophylaxis: scd Family Communication:none Status is: Inpatient  Remains inpatient appropriate because:Hemodynamically unstable   Dispo: The patient is from: Home              Anticipated d/c is to: Home              Anticipated d/c date is: 3 days              Patient currently is not medically stable to d/c.    Code Status:     Code Status Orders  (From admission, onward)         Start     Ordered   02/03/20 1433  Full code  Continuous        02/03/20 1432        Code Status History    Date Active Date Inactive Code Status Order ID Comments User Context   09/25/2019 1405 09/28/2019 2249 Full Code [id number]  Sherran Needs, NP Inpatient   09/08/2018 1209 10/14/2018 1649 Full Code [id number]  Mariel Aloe, MD Inpatient   06/21/2018 1713 06/29/2018 1559 Full Code [id number]  Georgette Shell, MD ED   12/19/2017 1726 12/23/2017 1453 DNR [id number]  Alma Friendly, MD Inpatient   10/17/2016 1231 10/21/2016 1911 Full Code [id number]  Lavina Hamman, MD Inpatient   09/20/2016 1009 09/21/2016 1229 Full Code [id number]  Franchot Gallo, MD Inpatient  03/03/2015 1929 03/05/2015 1428 Full Code [id number]  Gary Fleet, PA-C Inpatient   Advance Care Planning Activity    Advance Directive Documentation     Most Recent Value  Type of Advance Directive Healthcare Power of Attorney  Pre-existing out of facility DNR order (yellow form or pink MOST form) --  "MOST" Form in Place? --        IV Access:    Peripheral IV   Procedures and diagnostic studies:   No results found.   Medical Consultants:     None.  Anti-Infectives:   kelfex  Subjective:    [name] Rose diarrhea has resolved tolerating his diet.  Objective:    Vitals:   02/15/20 0851 02/15/20 1140 02/15/20 1924 02/16/20 0302  BP: 125/88 109/64 (!) 120/93 122/81  Pulse: (!) 105 (!) 116 (!) 105 71  Resp: $Remo'18 18 16 17  'FsKKh$ Temp:  98.4 F (36.9 C) 98.4 F (36.9 C) 98 F (36.7 C)  TempSrc:  Oral Oral Oral  SpO2: 93% 100% 96% 100%  Weight:    127 kg  Height:       SpO2: 100 %   Intake/Output Summary (Last 24 hours) at 02/16/2020 0703 Last data filed at 02/16/2020 0600 Gross per 24 hour  Intake 1650 ml  Output 3100 ml  Net -1450 ml   Filed Weights   02/14/20 0536 02/15/20 0335 02/16/20 0302  Weight: 127.9 kg 127.6 kg 127 kg    Exam: General exam: In no acute distress. Respiratory system: Good air movement and clear to auscultation. Cardiovascular system: S1 & S2 heard, RRR. No JVD. Gastrointestinal system: Abdomen is nondistended, soft and nontender.  Extremities: No pedal edema. Skin: No rashes, lesions or ulcers Psychiatry: Judgement and insight appear normal. Mood & affect appropriate.  Data Reviewed:    Labs: Basic Metabolic Panel: Recent Labs  Lab 02/10/20 0414 02/10/20 0414 02/11/20 0326 02/11/20 0326 02/12/20 0850 02/12/20 0850 02/13/20 1450 02/13/20 1450 02/14/20 0906 02/15/20 0131  NA 140  145   < > 142  --  145  --  138  --  139 137  K 2.8*  2.8*   < > 3.5   < > 3.3*   < > 2.8*   < > 3.0* 4.0  CL 97*  98   < > 99  --  104  --  105  --  108 109  CO2 25  26   < > 23  --  22  --  20*  --  19* 17*  GLUCOSE 115*  113*   < > 126*  --  95  --  156*  --  128* 113*  BUN 99*  85*   < > 104*  --  123*  --  105*  --  93* 88*  CREATININE 8.26*  8.13*   < > 9.21*  --  9.84*  --  8.57*  --  8.66* 8.42*  CALCIUM 7.4*  8.1*   < > 7.2*  --  7.4*  --  6.9*  --  7.1* 7.2*  MG 2.3  --   --   --   --   --   --   --   --   --   PHOS 5.0*  --   --   --   --   --   --   --   --   --    <  > = values in this interval not displayed.  GFR Estimated Creatinine Clearance: 11.9 mL/min (A) (by C-G formula based on SCr of 8.42 mg/dL (H)). Liver Function Tests: Recent Labs  Lab 02/10/20 0414 02/11/20 0326 02/12/20 0850  AST $Re'20 20 23  'yum$ ALT $R'5 5 5  'Wj$ ALKPHOS 44 47 48  BILITOT 0.7 1.1 0.9  PROT 4.5* 4.3* 4.3*  ALBUMIN 2.1*  2.3* 2.2* 2.0*   No results for input(s): LIPASE, AMYLASE in the last 168 hours. No results for input(s): AMMONIA in the last 168 hours. Coagulation profile No results for input(s): INR, PROTIME in the last 168 hours. COVID-19 Labs  No results for input(s): DDIMER, FERRITIN, LDH, CRP in the last 72 hours.  Lab Results  Component Value Date   SARSCOV2NAA NEGATIVE 02/03/2020   [place] NEGATIVE 09/22/2019   [place] NEGATIVE 08/02/2019   [place] NEGATIVE 05/15/2019    CBC: Recent Labs  Lab 02/11/20 0326 02/12/20 0850 02/13/20 0802 02/14/20 0906 02/15/20 0131  WBC 9.9 7.6 7.4 6.7 6.6  NEUTROABS 7.0 5.2 5.0 4.5 4.7  HGB 8.1* 7.9* 8.2* 8.0* 7.4*  HCT 24.2* 24.0* 24.9* 24.0* 22.7*  MCV 98.8 100.8* 100.8* 100.8* 100.9*  PLT 35* 36* 41* 44* 53*   Cardiac Enzymes: No results for input(s): CKTOTAL, CKMB, CKMBINDEX, TROPONINI in the last 168 hours. BNP (last 3 results) No results for input(s): PROBNP in the last 8760 hours. CBG: Recent Labs  Lab 02/09/20 0716  GLUCAP 171*   D-Dimer: No results for input(s): DDIMER in the last 72 hours. Hgb A1c: No results for input(s): HGBA1C in the last 72 hours. Lipid Profile: No results for input(s): CHOL, HDL, LDLCALC, TRIG, CHOLHDL, LDLDIRECT in the last 72 hours. Thyroid function studies: No results for input(s): TSH, T4TOTAL, T3FREE, THYROIDAB in the last 72 hours.  Invalid input(s): FREET3 Anemia work up: No results for input(s): VITAMINB12, FOLATE, FERRITIN, TIBC, IRON, RETICCTPCT in the last 72 hours. Sepsis Labs: Recent Labs  Lab 02/12/20 0850 02/13/20 0802 02/14/20 0906  02/15/20 0131  WBC 7.6 7.4 6.7 6.6   Microbiology Recent Results (from the past 240 hour(s))  Gastrointestinal Panel by PCR , Stool     Status: None   Collection Time: 02/06/20 10:54 AM   Specimen: Stool  Result Value Ref Range Status   Campylobacter species NOT DETECTED NOT DETECTED Final   Plesimonas shigelloides NOT DETECTED NOT DETECTED Final   Salmonella species NOT DETECTED NOT DETECTED Final   Yersinia enterocolitica NOT DETECTED NOT DETECTED Final   Vibrio species NOT DETECTED NOT DETECTED Final   Vibrio cholerae NOT DETECTED NOT DETECTED Final   Enteroaggregative E coli (EAEC) NOT DETECTED NOT DETECTED Final   Enteropathogenic E coli (EPEC) NOT DETECTED NOT DETECTED Final   Enterotoxigenic E coli (ETEC) NOT DETECTED NOT DETECTED Final   Shiga like toxin producing E coli (STEC) NOT DETECTED NOT DETECTED Final   Shigella/Enteroinvasive E coli (EIEC) NOT DETECTED NOT DETECTED Final   Cryptosporidium NOT DETECTED NOT DETECTED Final   Cyclospora cayetanensis NOT DETECTED NOT DETECTED Final   Entamoeba histolytica NOT DETECTED NOT DETECTED Final   Giardia lamblia NOT DETECTED NOT DETECTED Final   Adenovirus F40/41 NOT DETECTED NOT DETECTED Final   Astrovirus NOT DETECTED NOT DETECTED Final   Norovirus GI/GII NOT DETECTED NOT DETECTED Final   Rotavirus A NOT DETECTED NOT DETECTED Final   Sapovirus (I, II, IV, and V) NOT DETECTED NOT DETECTED Final    Comment: Performed at [facility], [address] [place], [place] [address]  Culture, blood (Routine X 2)  w Reflex to ID Panel     Status: None   Collection Time: 02/09/20 10:48 AM   Specimen: BLOOD  Result Value Ref Range Status   Specimen Description   Final    BLOOD LEFT ARM Performed at [facility] [address], [place], [place] [address]    Special Requests   Final    BOTTLES DRAWN AEROBIC AND ANAEROBIC Blood Culture adequate volume Performed at [facility]  [address], [place], [place] [address]    Culture   Final    NO GROWTH 5 DAYS Performed at [facility], [place] [address] [place], [place] [address]    Report Status 02/14/2020 FINAL  Final  Culture, blood (Routine X 2) w Reflex to ID Panel     Status: None   Collection Time: 02/09/20 10:50 AM   Specimen: BLOOD  Result Value Ref Range Status   Specimen Description   Final    BLOOD LEFT ARM Performed at [facility] [address], [place], [place] [address]    Special Requests   Final    BOTTLES DRAWN AEROBIC AND ANAEROBIC Blood Culture adequate volume Performed at [facility] [address] [place], [place] [address]    Culture   Final    NO GROWTH 5 DAYS Performed at [facility], [place] [address] [place], [place] [address]    Report Status 02/14/2020 FINAL  Final     Medications:   . sodium chloride   Intravenous Once  . apixaban  5 mg Oral BID  . cephALEXin  500 mg Oral Q12H  . Chlorhexidine Gluconate Cloth  6 each Topical Daily  . metoprolol tartrate  50 mg Oral BID  . oxybutynin  10 mg Oral QHS  . pantoprazole  40 mg Oral Daily  . psyllium  1 packet Oral Daily  . sodium chloride flush  10-40 mL Intracatheter Q12H   Continuous Infusions:     LOS: 13 days   Charlynne Cousins  [facility]  02/16/2020, 7:03 AM

## 2020-02-16 NOTE — Progress Notes (Signed)
[facility] Progress Note   69 y.o.year-oldafib, HTN, CKD 4baseline creat is 1.9- 2.6 from 2021and bladder cancer w/ mets on chemoRx presented on 10/24 w/ gen'd weakness, diarrhea for 5 days, no appetite or fevers. Consulted for AKI likely secondary to hypotension causing ATN.   Assessment/ Plan:   1. AoCKD 4 - Baseline creat is 1.9- 2.6 from 2021, eGFR 26- 34 ml/min. Creat 2.5 on admitand increase significantly likely due to ATN.  Continuing to improve slowly. -Oral hydration sufficient at this time.  Likely will just take time for ATN to resolve, urine output seems to be improving -No indications for dialysis at this time -Would feel better about discharge once patient's creatinine has consistently improved for a couple more days, ideally can discharge early next week  2. Klebs pna neutropenic sepsis -BP is improved, antibiotic treatment per primary team 3. Neutropenia/ pancytopenia - per pmd, ONC 4. Atrial fib -continue home rate controlling agents per primary team. 5. Metastatic bladder cancer - per pmd 6. Thrombocytopenia - some mild improvement 7. Hypokalemia: Continue oral replacement today as needed.  Potassium at goal today 8. Anemia: Multifactorial with a hemoglobin of 6.3 today.  Avoiding ESA given his malignancy.  Would benefit from transfusion today per primary team.  02/16/2020, 9:35 AM      Subjective:   Patient states he feels well today without any specific complaints.  Trying to drink water.   Objective:   BP 109/70 (BP Location: Right Arm)   Pulse 68   Temp 98 F (36.7 C) (Oral)   Resp 18   Ht '6\' 2"'  (1.88 m)   Wt 127 kg   SpO2 96%   BMI 35.95 kg/m   Intake/Output Summary (Last 24 hours) at 02/16/2020 0935 Last data filed at 02/16/2020 0813 Gross per 24 hour  Intake 1430 ml  Output 3300 ml  Net -1870 ml   Weight change: -0.544 kg  Physical Exam: Gensitting in bed, no distress Chest bilateral chest rise with no increased work of  breathing CVirreg irregrhythm, tachycardia Abd soft ntnd no mass or ascites, NGT Exttr trace pitting edema in the bilateral lower extremities Neuro is alert, Ox 3 , nf, no asterixis  Imaging: No results found.  Labs: BMET Recent Labs  Lab 02/10/20 0414 02/11/20 0326 02/12/20 0850 02/13/20 1450 02/14/20 0906 02/15/20 0131 02/16/20 0610  NA 140  145 142 145 138 139 137 136  K 2.8*  2.8* 3.5 3.3* 2.8* 3.0* 4.0 3.7  CL 97*  98 99 104 105 108 109 107  CO2 '25  26 23 22 ' 20* 19* 17* 18*  GLUCOSE 115*  113* 126* 95 156* 128* 113* 109*  BUN 99*  85* 104* 123* 105* 93* 88* 83*  CREATININE 8.26*  8.13* 9.21* 9.84* 8.57* 8.66* 8.42* 8.27*  CALCIUM 7.4*  8.1* 7.2* 7.4* 6.9* 7.1* 7.2* 6.9*  PHOS 5.0*  --   --   --   --   --   --    CBC Recent Labs  Lab 02/13/20 0802 02/14/20 0906 02/15/20 0131 02/16/20 0610  WBC 7.4 6.7 6.6 5.8  NEUTROABS 5.0 4.5 4.7 4.0  HGB 8.2* 8.0* 7.4* 6.3*  HCT 24.9* 24.0* 22.7* 19.6*  MCV 100.8* 100.8* 100.9* 101.6*  PLT 41* 44* 53* 58*    Medications:    . sodium chloride   Intravenous Once  . sodium chloride   Intravenous Once  . apixaban  5 mg Oral BID  . cephALEXin  500 mg Oral  Q12H  . Chlorhexidine Gluconate Cloth  6 each Topical Daily  . metoprolol tartrate  50 mg Oral BID  . oxybutynin  10 mg Oral QHS  . pantoprazole  40 mg Oral BID  . psyllium  1 packet Oral Daily  . sodium chloride flush  10-40 mL Intracatheter Q12H

## 2020-02-17 LAB — CBC
HCT: 23.7 % — ABNORMAL LOW (ref 39.0–52.0)
Hemoglobin: 7.8 g/dL — ABNORMAL LOW (ref 13.0–17.0)
MCH: 31.7 pg (ref 26.0–34.0)
MCHC: 32.9 g/dL (ref 30.0–36.0)
MCV: 96.3 fL (ref 80.0–100.0)
Platelets: 76 10*3/uL — ABNORMAL LOW (ref 150–400)
RBC: 2.46 MIL/uL — ABNORMAL LOW (ref 4.22–5.81)
RDW: 17.2 % — ABNORMAL HIGH (ref 11.5–15.5)
WBC: 6.1 10*3/uL (ref 4.0–10.5)
nRBC: 0 % (ref 0.0–0.2)

## 2020-02-17 LAB — BASIC METABOLIC PANEL
Anion gap: 9 (ref 5–15)
BUN: 85 mg/dL — ABNORMAL HIGH (ref 8–23)
CO2: 20 mmol/L — ABNORMAL LOW (ref 22–32)
Calcium: 7.1 mg/dL — ABNORMAL LOW (ref 8.9–10.3)
Chloride: 110 mmol/L (ref 98–111)
Creatinine, Ser: 7.63 mg/dL — ABNORMAL HIGH (ref 0.61–1.24)
GFR, Estimated: 7 mL/min — ABNORMAL LOW (ref >60)
Glucose, Bld: 114 mg/dL — ABNORMAL HIGH (ref 70–99)
Potassium: 3.7 mmol/L (ref 3.5–5.1)
Sodium: 139 mmol/L (ref 135–145)

## 2020-02-17 MED ORDER — SODIUM CHLORIDE 0.9 % IV SOLN: INTRAVENOUS | Status: DC

## 2020-02-17 NOTE — Progress Notes (Signed)
[facility] Progress Note   69 y.o.year-oldafib, HTN, CKD 4baseline creat is 1.9- 2.6 from 2021and bladder cancer w/ mets on chemoRx presented on 10/24 w/ gen'd weakness, diarrhea for 5 days, no appetite or fevers. Consulted for AKI likely secondary to hypotension causing ATN.   Assessment/ Plan:   1. AoCKD 4 - Baseline creat is 1.9- 2.6 from 2021, eGFR 26- 34 ml/min. Creat 2.5 on admitand increase significantly likely due to ATN.  Significant improvement over the past 24 hours with creatinine going from 8.3-7.6 -Oral hydration sufficient at this time.  Could consider IV hydration if patient fails to keep up with high urine output -No indications for dialysis at this time, likely will continue to improve -Creatinine is trying to improve more quickly.  Hopefully can leave early next week from a kidney standpoint  2. Klebs pna neutropenic sepsis -BP is improved, now status post antibiotics 3. Neutropenia/ pancytopenia - per pmd, ONC 4. Atrial fib -continue home rate controlling agents per primary team.  Heart rate improved 5. Metastatic bladder cancer - per pmd 6. Thrombocytopenia - some mild improvement 7. Hypokalemia: Resolved with oral repletion 8. Anemia: Multifactorial status post transfusion yesterday.  Avoiding ESA due to malignancy.  Continue to transfuse as needed per primary team  02/17/2020, 8:08 AM      Subjective:   Patient resting today without complaints   Objective:   BP 140/76 (BP Location: Right Arm)   Pulse 98   Temp 98.2 F (36.8 C) (Oral)   Resp 20   Ht '6\' 2"'  (1.88 m)   Wt 127 kg   SpO2 100%   BMI 35.95 kg/m   Intake/Output Summary (Last 24 hours) at 02/17/2020 2725 Last data filed at 02/17/2020 0800 Gross per 24 hour  Intake 2028 ml  Output 3451 ml  Net -1423 ml   Weight change: 0 kg  Physical Exam: Genlying in bed, resting Chest bilateral chest rise with no increased work of breathing CVirreg irregrhythm, normal rate Abd  soft ntnd no mass or ascites, NGT Exttr trace pitting edema in the bilateral lower extremities Neuro no obvious deficits  Imaging: No results found.  Labs: BMET Recent Labs  Lab 02/11/20 0326 02/12/20 0850 02/13/20 1450 02/14/20 0906 02/15/20 0131 02/16/20 0610 02/17/20 0438  NA 142 145 138 139 137 136 139  K 3.5 3.3* 2.8* 3.0* 4.0 3.7 3.7  CL 99 104 105 108 109 107 110  CO2 23 22 20* 19* 17* 18* 20*  GLUCOSE 126* 95 156* 128* 113* 109* 114*  BUN 104* 123* 105* 93* 88* 83* 85*  CREATININE 9.21* 9.84* 8.57* 8.66* 8.42* 8.27* 7.63*  CALCIUM 7.2* 7.4* 6.9* 7.1* 7.2* 6.9* 7.1*   CBC Recent Labs  Lab 02/13/20 0802 02/13/20 0802 02/14/20 0906 02/14/20 0906 02/15/20 0131 02/16/20 0610 02/16/20 1051 02/17/20 0438  WBC 7.4   < > 6.7   < > 6.6 5.8 5.9 6.1  NEUTROABS 5.0  --  4.5  --  4.7 4.0  --   --   HGB 8.2*   < > 8.0*   < > 7.4* 6.3* 6.8* 7.8*  HCT 24.9*   < > 24.0*   < > 22.7* 19.6* 21.0* 23.7*  MCV 100.8*   < > 100.8*   < > 100.9* 101.6* 101.9* 96.3  PLT 41*   < > 44*   < > 53* 58* 60* 76*   < > = values in this interval not displayed.    Medications:    .  sodium chloride   Intravenous Once  . apixaban  5 mg Oral BID  . Chlorhexidine Gluconate Cloth  6 each Topical Daily  . metoprolol tartrate  50 mg Oral BID  . oxybutynin  10 mg Oral QHS  . pantoprazole  40 mg Oral BID  . sodium chloride flush  10-40 mL Intracatheter Q12H

## 2020-02-17 NOTE — Plan of Care (Signed)
  Problem: Nutrition: Goal: Adequate nutrition will be maintained Outcome: Completed/Met   Problem: Pain Managment: Goal: General experience of comfort will improve Outcome: Completed/Met

## 2020-02-17 NOTE — Progress Notes (Signed)
[facility] PROGRESS NOTE    Progress Note  [name] Rose  QQV:[id number] DOB: 01-Jul-1950 DOA: 02/03/2020 PCP: Shirline Frees, MD     Brief Narrative:   [name] Rose is an 69 y.o. male past medical history significant for metastatic bladder cancer currently on chemotherapy, paroxysmal atrial fibrillation, chronic kidney disease stage 3 comes into the ED for generalized weakness and diarrhea that started 4 days prior to admission.  On admission he was found to be septic with neutropenia with started on antibiotics oncology was consulted blood cultures grew Klebsiella.  Hospital course complicated by acute kidney injury and rectal and NG tube bleeding on 02/10/2020 he was transfused 1 unit of red blood cells, was started on IV Protonix, subsequently developed ileus which is slowly improving.  Assessment/Plan:   Acute renal failure with acute tubular necrosis superimposed on stage 3 chronic kidney disease ([place]): In the setting of sepsis and bladder cancer on chemo. Leading to ATN, his creatinine this morning is 7.3 has had good urine output about 3.4 L. He has no uremic symptoms he is in the post ATN faced we will have to keep a close eye he is only about a liter and 1/2+, if he is not able to keep up with the urine output will have to be placed on IV fluid for now we will observe he still appears fluid overloaded on physical exam.  Vomiting and diarrhea after chemotherapy now resolved, with ileus: X-ray was improved on 02/12/2020. Resolved stop Metamucil and Lomotil.  Klebsiella bacteremia/severe sepsis: Started on IV antibiotics on admission has completed his course of antibiotics today on 02/16/2020.  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 ([place]) Appreciate oncology's assistance.  Acute thrombocytopenia: Related to chemotherapy in the setting of sepsis. Status post 1 unit of platelets platelets continue to improve.  GI bleed/chemotherapy-induced  anemia: In the setting of thrombocytopenia and NG tube.  He has subsided. Status post 3 units of packed red blood cells. Change Protonix to oral. Hemoglobin this morning is 7.8  A. fib with RVR: With a chads Vascor greater than 2. Rate is currently controlled, Eliquis was held due to GI bleed and thrombocytopenia, Tikosyn is also being held due to acute kidney injury. His rate is controlled metoprolol and Cardizem. Can probably resume Eliquis once his hemoglobin remained stable.  Hypokalemia: Replete orally basic metabolic panel is pending this morning.  DVT prophylaxis: scd Family Communication:none Status is: Inpatient  Remains inpatient appropriate because:Hemodynamically unstable   Dispo: The patient is from: Home              Anticipated d/c is to: Home              Anticipated d/c date is: 3 days              Patient currently is not medically stable to d/c.    Code Status:     Code Status Orders  (From admission, onward)         Start     Ordered   02/03/20 1433  Full code  Continuous        02/03/20 1432        Code Status History    Date Active Date Inactive Code Status Order ID Comments User Context   09/25/2019 1405 09/28/2019 2249 Full Code [id number]  Sherran Needs, NP Inpatient   09/08/2018 1209 10/14/2018 1649 Full Code [id number]  Mariel Aloe, MD Inpatient   06/21/2018 1713 06/29/2018 1559 Full Code  [id number]  Georgette Shell, MD ED   12/19/2017 1726 12/23/2017 1453 DNR [id number]  Alma Friendly, MD Inpatient   10/17/2016 1231 10/21/2016 1911 Full Code [id number]  Lavina Hamman, MD Inpatient   09/20/2016 1009 09/21/2016 1229 Full Code [id number]  Franchot Gallo, MD Inpatient   03/03/2015 1929 03/05/2015 1428 Full Code [id number]  Gary Fleet, PA-C Inpatient   Advance Care Planning Activity    Advance Directive Documentation     Most Recent Value  Type of Advance Directive Healthcare Power of Attorney  Pre-existing out of facility DNR  order (yellow form or pink MOST form) --  "MOST" Form in Place? --        IV Access:    Peripheral IV   Procedures and diagnostic studies:   No results found.   Medical Consultants:    None.  Anti-Infectives:   kelfex  Subjective:    [name] Rose area resolved tolerating his diet.  Objective:    Vitals:   02/16/20 1934 02/16/20 1945 02/16/20 2150 02/17/20 0337  BP: 123/81 (!) 141/96 136/76 140/76  Pulse: 80 96 94 98  Resp: $Remo'18 20  20  'BxgsM$ Temp: 97.6 F (36.4 C) 98.4 F (36.9 C)  98.2 F (36.8 C)  TempSrc: Oral Oral  Oral  SpO2: 99% 100%  100%  Weight:    127 kg  Height:       SpO2: 100 %   Intake/Output Summary (Last 24 hours) at 02/17/2020 0755 Last data filed at 02/17/2020 0500 Gross per 24 hour  Intake 1908 ml  Output 3451 ml  Net -1543 ml   Filed Weights   02/15/20 0335 02/16/20 0302 02/17/20 0337  Weight: 127.6 kg 127 kg 127 kg    Exam: General exam: In no acute distress. Respiratory system: Good air movement and clear to auscultation. Cardiovascular system: S1 & S2 heard, RRR. No JVD. Gastrointestinal system: Abdomen is nondistended, soft and nontender.  Extremities: No pedal edema. Skin: No rashes, lesions or ulcers Psychiatry: Judgement and insight appear normal. Mood & affect appropriate. Data Reviewed:    Labs: Basic Metabolic Panel: Recent Labs  Lab 02/13/20 1450 02/13/20 1450 02/14/20 0906 02/14/20 0906 02/15/20 0131 02/15/20 0131 02/16/20 0610 02/17/20 0438  NA 138  --  139  --  137  --  136 139  K 2.8*   < > 3.0*   < > 4.0   < > 3.7 3.7  CL 105  --  108  --  109  --  107 110  CO2 20*  --  19*  --  17*  --  18* 20*  GLUCOSE 156*  --  128*  --  113*  --  109* 114*  BUN 105*  --  93*  --  88*  --  83* 85*  CREATININE 8.57*  --  8.66*  --  8.42*  --  8.27* 7.63*  CALCIUM 6.9*  --  7.1*  --  7.2*  --  6.9* 7.1*   < > = values in this interval not displayed.   GFR Estimated Creatinine Clearance: 13.1 mL/min (A) (by  C-G formula based on SCr of 7.63 mg/dL (H)). Liver Function Tests: Recent Labs  Lab 02/11/20 0326 02/12/20 0850  AST 20 23  ALT 5 5  ALKPHOS 47 48  BILITOT 1.1 0.9  PROT 4.3* 4.3*  ALBUMIN 2.2* 2.0*   No results for input(s): LIPASE, AMYLASE in the last 168 hours. No results for input(s): AMMONIA in the last  168 hours. Coagulation profile No results for input(s): INR, PROTIME in the last 168 hours. COVID-19 Labs  No results for input(s): DDIMER, FERRITIN, LDH, CRP in the last 72 hours.  Lab Results  Component Value Date   SARSCOV2NAA NEGATIVE 02/03/2020   SARSCOV2NAA NEGATIVE 09/22/2019   SARSCOV2NAA NEGATIVE 08/02/2019   SARSCOV2NAA NEGATIVE 05/15/2019    CBC: Recent Labs  Lab 02/12/20 0850 02/12/20 0850 02/13/20 0802 02/13/20 0802 02/14/20 0906 02/15/20 0131 02/16/20 0610 02/16/20 1051 02/17/20 0438  WBC 7.6   < > 7.4   < > 6.7 6.6 5.8 5.9 6.1  NEUTROABS 5.2  --  5.0  --  4.5 4.7 4.0  --   --   HGB 7.9*   < > 8.2*   < > 8.0* 7.4* 6.3* 6.8* 7.8*  HCT 24.0*   < > 24.9*   < > 24.0* 22.7* 19.6* 21.0* 23.7*  MCV 100.8*   < > 100.8*   < > 100.8* 100.9* 101.6* 101.9* 96.3  PLT 36*   < > 41*   < > 44* 53* 58* 60* 76*   < > = values in this interval not displayed.   Cardiac Enzymes: No results for input(s): CKTOTAL, CKMB, CKMBINDEX, TROPONINI in the last 168 hours. BNP (last 3 results) No results for input(s): PROBNP in the last 8760 hours. CBG: No results for input(s): GLUCAP in the last 168 hours. D-Dimer: No results for input(s): DDIMER in the last 72 hours. Hgb A1c: No results for input(s): HGBA1C in the last 72 hours. Lipid Profile: No results for input(s): CHOL, HDL, LDLCALC, TRIG, CHOLHDL, LDLDIRECT in the last 72 hours. Thyroid function studies: No results for input(s): TSH, T4TOTAL, T3FREE, THYROIDAB in the last 72 hours.  Invalid input(s): FREET3 Anemia work up: No results for input(s): VITAMINB12, FOLATE, FERRITIN, TIBC, IRON, RETICCTPCT in the  last 72 hours. Sepsis Labs: Recent Labs  Lab 02/15/20 0131 02/16/20 0610 02/16/20 1051 02/17/20 0438  WBC 6.6 5.8 5.9 6.1   Microbiology Recent Results (from the past 240 hour(s))  Culture, blood (Routine X 2) w Reflex to ID Panel     Status: None   Collection Time: 02/09/20 10:48 AM   Specimen: BLOOD  Result Value Ref Range Status   Specimen Description   Final    BLOOD LEFT ARM Performed at [facility], [address], [place], [place] [address]    Special Requests   Final    BOTTLES DRAWN AEROBIC AND ANAEROBIC Blood Culture adequate volume Performed at [facility], [address], [place], [place] [address]    Culture   Final    NO GROWTH 5 DAYS Performed at [facility], [address], [place], [place] [address]    Report Status 02/14/2020 FINAL  Final  Culture, blood (Routine X 2) w Reflex to ID Panel     Status: None   Collection Time: 02/09/20 10:50 AM   Specimen: BLOOD  Result Value Ref Range Status   Specimen Description   Final    BLOOD LEFT ARM Performed at [facility], [address], [place], [place] [address]    Special Requests   Final    BOTTLES DRAWN AEROBIC AND ANAEROBIC Blood Culture adequate volume Performed at [facility], [address], [place], [place] [address]    Culture   Final    NO GROWTH 5 DAYS Performed at [facility], [address], [place], [place] [address]  Report Status 02/14/2020 FINAL  Final     Medications:   . sodium chloride   Intravenous Once  . apixaban  5 mg Oral BID  . Chlorhexidine Gluconate Cloth  6 each Topical Daily  . metoprolol tartrate  50 mg Oral BID  . oxybutynin  10 mg Oral QHS  . pantoprazole  40 mg Oral BID  . psyllium  1 packet Oral Daily  . sodium chloride flush  10-40 mL Intracatheter Q12H   Continuous Infusions:     LOS: 14 days   Charlynne Cousins  [facility]  02/17/2020, 7:55 AM

## 2020-02-18 ENCOUNTER — Inpatient Hospital Stay: Payer: PPO

## 2020-02-18 ENCOUNTER — Inpatient Hospital Stay: Payer: PPO | Admitting: Family

## 2020-02-18 DIAGNOSIS — K922 Gastrointestinal hemorrhage, unspecified: Secondary | ICD-10-CM

## 2020-02-18 DIAGNOSIS — N1831 Chronic kidney disease, stage 3a: Secondary | ICD-10-CM

## 2020-02-18 LAB — BASIC METABOLIC PANEL
Anion gap: 11 (ref 5–15)
BUN: 95 mg/dL — ABNORMAL HIGH (ref 8–23)
CO2: 17 mmol/L — ABNORMAL LOW (ref 22–32)
Calcium: 6.9 mg/dL — ABNORMAL LOW (ref 8.9–10.3)
Chloride: 110 mmol/L (ref 98–111)
Creatinine, Ser: 7.7 mg/dL — ABNORMAL HIGH (ref 0.61–1.24)
GFR, Estimated: 7 mL/min — ABNORMAL LOW (ref >60)
Glucose, Bld: 115 mg/dL — ABNORMAL HIGH (ref 70–99)
Potassium: 3.4 mmol/L — ABNORMAL LOW (ref 3.5–5.1)
Sodium: 138 mmol/L (ref 135–145)

## 2020-02-18 LAB — IRON AND TIBC
Iron: 57 ug/dL (ref 45–182)
Saturation Ratios: 46 % — ABNORMAL HIGH (ref 17.9–39.5)
TIBC: 125 ug/dL — ABNORMAL LOW (ref 250–450)
UIBC: 68 ug/dL

## 2020-02-18 LAB — PREPARE RBC (CROSSMATCH)

## 2020-02-18 LAB — CBC
HCT: 19.9 % — ABNORMAL LOW (ref 39.0–52.0)
Hemoglobin: 6.7 g/dL — CL (ref 13.0–17.0)
MCH: 32.4 pg (ref 26.0–34.0)
MCHC: 33.7 g/dL (ref 30.0–36.0)
MCV: 96.1 fL (ref 80.0–100.0)
Platelets: 80 10*3/uL — ABNORMAL LOW (ref 150–400)
RBC: 2.07 MIL/uL — ABNORMAL LOW (ref 4.22–5.81)
RDW: 16.8 % — ABNORMAL HIGH (ref 11.5–15.5)
WBC: 6.6 10*3/uL (ref 4.0–10.5)
nRBC: 0 % (ref 0.0–0.2)

## 2020-02-18 LAB — APTT: aPTT: 30 seconds (ref 24–36)

## 2020-02-18 LAB — MAGNESIUM: Magnesium: 0.9 mg/dL — CL (ref 1.7–2.4)

## 2020-02-18 LAB — PROTIME-INR
INR: 1.5 — ABNORMAL HIGH (ref 0.8–1.2)
Prothrombin Time: 17.6 seconds — ABNORMAL HIGH (ref 11.4–15.2)

## 2020-02-18 LAB — FERRITIN: Ferritin: 1500 ng/mL — ABNORMAL HIGH (ref 24–336)

## 2020-02-18 MED ORDER — EPOETIN ALFA 40000 UNIT/ML IJ SOLN
40000.0000 [IU] | Freq: Once | INTRAMUSCULAR | Status: AC
  Administered 2020-02-18: 40000 [IU] via SUBCUTANEOUS
  Filled 2020-02-18: qty 1

## 2020-02-18 MED ORDER — SODIUM BICARBONATE 650 MG PO TABS
1300.0000 mg | ORAL_TABLET | Freq: Two times a day (BID) | ORAL | Status: DC
  Administered 2020-02-18 – 2020-02-25 (×15): 1300 mg via ORAL
  Filled 2020-02-18 (×15): qty 2

## 2020-02-18 MED ORDER — PANTOPRAZOLE SODIUM 40 MG IV SOLR
40.0000 mg | Freq: Two times a day (BID) | INTRAVENOUS | Status: DC
  Administered 2020-02-18: 40 mg via INTRAVENOUS
  Filled 2020-02-18: qty 40

## 2020-02-18 MED ORDER — DILTIAZEM HCL-DEXTROSE 125-5 MG/125ML-% IV SOLN (PREMIX)
5.0000 mg/h | INTRAVENOUS | Status: DC
  Administered 2020-02-18 – 2020-02-20 (×3): 5 mg/h via INTRAVENOUS
  Filled 2020-02-18 (×5): qty 125

## 2020-02-18 MED ORDER — SODIUM CHLORIDE 0.9 % IV SOLN
8.0000 mg/h | INTRAVENOUS | Status: DC
  Administered 2020-02-18 – 2020-02-20 (×5): 8 mg/h via INTRAVENOUS
  Filled 2020-02-18 (×10): qty 80

## 2020-02-18 MED ORDER — MAGNESIUM SULFATE 2 GM/50ML IV SOLN
2.0000 g | Freq: Once | INTRAVENOUS | Status: AC
  Administered 2020-02-18: 2 g via INTRAVENOUS
  Filled 2020-02-18: qty 50

## 2020-02-18 MED ORDER — SODIUM CHLORIDE 0.9 % IV SOLN: INTRAVENOUS | Status: DC

## 2020-02-18 MED ORDER — MAGNESIUM SULFATE 2 GM/50ML IV SOLN
2.0000 g | Freq: Once | INTRAVENOUS | Status: AC
  Administered 2020-02-18: 2 g via INTRAVENOUS

## 2020-02-18 MED ORDER — POTASSIUM CHLORIDE 20 MEQ PO PACK
40.0000 meq | PACK | Freq: Once | ORAL | Status: AC
  Administered 2020-02-18: 40 meq via ORAL
  Filled 2020-02-18: qty 2

## 2020-02-18 MED ORDER — LACTATED RINGERS IV BOLUS
500.0000 mL | Freq: Once | INTRAVENOUS | Status: AC
  Administered 2020-02-18: 500 mL via INTRAVENOUS

## 2020-02-18 MED ORDER — SODIUM CHLORIDE 0.9% IV SOLUTION: Freq: Once | INTRAVENOUS | Status: DC

## 2020-02-18 MED ORDER — SODIUM CHLORIDE 0.9 % IV SOLN: INTRAVENOUS | Status: AC

## 2020-02-18 MED ORDER — SODIUM CHLORIDE 0.9% IV SOLUTION: Freq: Once | INTRAVENOUS | Status: AC

## 2020-02-18 NOTE — Progress Notes (Addendum)
HOSPITAL MEDICINE OVERNIGHT EVENT NOTE     Notified by nursing the patient has had at least 2 episodes of 100 to 150 cc of melena this evening.  This has been associated with a drop of hemoglobin from 7.8 yesterday to 6.7 this morning.  Chart reviewed, patient has had a prolonged hospital course.  Patient was originally admitted for sepsis with hospital course complicated by acute kidney injury with acute tubular necrosis.  There notably was concern for brief GI bleeding thought to be secondary to the patient's NG tube on 10/31.  Patient is also exhibited recurrent bouts of anemia throughout the hospital course that were felt to be multifactorial -  patient has received a total of 3 units of packed red blood cells up to today.  Due to the recurrent drop in hemoglobin I have discontinued his Eliquis, ordered an additional 1 unit of packed blood cells (this will be his 4th unit overall), made patient n.p.o., placed patient on Protonix 40 g IV every 12 hours.  I have also sent in epic secure chat message to on-call GI for consultation today and consideration of endoscopic work-up if it is felt to be indicated.  Vernelle Emerald  MD [facility]   ADDENDUM 11/8 6AM  Patient has suddenly developed a tachyarrhythmia in the past 30-45 min, going between sinus tachycardia and atrial fibrillation with heart rates between 120-140 BPM.    I went to evaluate the patient at the bedside.  Patient denies chest pain, shortness of breath or light headedness.  Patient is AAO x 3.  Lungs are essentially clear with heart sounds rapid and irregularly irregular.    Labs this AM reviewed revealing hypokalemia of 3.4.  I am providing 41meq potassium now.  I am additionally adding on a magnesium level but am preemptively giving the patient some IV magnesium for presumed hypomagnesemia.    Also providing 500cc bolus of LR as we await initiation of the blood transfusion.  Review of home meds reveals patient is  typically on both Metoprolol and Diltiazem but is currently only receiving Metoprolol monotherapy for rate control.  Will initiate Diltiazem infusion without bolus.    Initiating blood transfusion as soon as blood is available.    Sherryll Burger Renold Kozar  ADDENDUM 11/8 6:30AM  Magnesium found to be 0.9.  Adding 2 more grams of IV Magnesim sulfate to total 4 grams.   Sherryll Burger Collen Vincent

## 2020-02-18 NOTE — Progress Notes (Signed)
MD ordered 1 unit of blood, due to Hgb 6.7 this am, K was 3.4, ordered 65meQ of potassium and ordered $RemoveBef'2mg'kDSPMvenxA$  IV magnesium and later found out his Mg level was 0.9, which MD ordered another 2 mg, HR went into the 140's, did an ECG showed SVT then sinus tach, MD ordered also LR bolus 587ml, will continue to monitor, Thanks Arvella Nigh RN.

## 2020-02-18 NOTE — Progress Notes (Addendum)
Admit: 02/03/2020 LOS: 91  69 y.o. year-old Afib, on Eliquis, HTN, CKD 4 baseline creat is 1.9- 2.6 from 2021and bladder cancer w/ mets on chemoRx presented on 10/24 w/ gen'd weakness, diarrhea for 5 days, no appetite or fevers.  Consulted for AKI likely secondary to hypotension causing ATN.    Subjective:   No acute events overnight. Patient upset that he cannot eat today and reports he was not told why he could have his breakfast this morning.  He reports generalized itching that started prior to receiving todays blood transfusion.  Denies any chest api, SOB, abdominal pain, hematuria or bloody stool.  Reports making good urine output and up until this morning was drinking well.    11/07 0701 - 11/08 0700 In: 850 [P.O.:800; IV Piggyback:50] Out: 2253 [Urine:2250; Stool:3]  Filed Weights   02/16/20 0302 02/17/20 0337 02/18/20 0024  Weight: 127 kg 127 kg 123.8 kg    Scheduled Meds:  sodium chloride   Intravenous Once   sodium chloride   Intravenous Once   sodium chloride   Intravenous Once   Chlorhexidine Gluconate Cloth  6 each Topical Daily   epoetin alfa  40,000 Units Subcutaneous Once   metoprolol tartrate  50 mg Oral BID   oxybutynin  10 mg Oral QHS   pantoprazole (PROTONIX) IV  40 mg Intravenous Q12H   sodium chloride flush  10-40 mL Intracatheter Q12H   Continuous Infusions:  diltiazem (CARDIZEM) infusion     magnesium sulfate bolus IVPB 2 g (02/18/20 0645)   Current Labs: reviewed Hbg 6.7, K 3.4, Cr 7.70, BUN 95   Physical Exam:  Blood pressure 123/78, pulse (!) 118, temperature 98.9 F (37.2 C), temperature source Oral, resp. rate 18, height '6\' 2"'  (1.88 m), weight 123.8 kg, SpO2 97 %.   GEN: wdwn, sitting in bed, nad ENT: no nasal discharge, mmm EYES: no scleral icterus, eomi CV: normal rate, no murmurs PULM: no iwob, bilateral chest rise ABD: NABS, non-distended SKIN: no rashes or jaundice EXT: trace edema, warm and well perfused   A/P  Acute on  Chronic Kidney Disease 4 Baseline creat is 1.9- 2.6 from 2021, eGFR 26- 34 ml/min. Creat 2.5 on admitand increase significantly likely due to ATN.   Slight increase in Creatinine overnight but overall improved.   -Continue oral hydration and consider IV hydration if continues to have high urinary output -Would also consider GI bleed as contributing factor for increasing BUN as continues to have low Hbg without documented source of bleeding.  Consider GI consult. -I do not think dialysis is warranted at this time.   Klebsiella PNA Neutropenic Sepsis Normotensive.  Continues to remain afebrile.  Competed course of antibiotics.  Neutropenia/Pancytopenia Per primary MD, ONC  AFIB Uncontrolled HR overnight.  Was started on Cardizem drip Continue rate control medication per primary team  Metastatic Bladder Cancer Per primary MD  Thrombocytopenia Slight improvement   Hypokalemia Replete as needed Repeat pm K labs   Hypomagnesimia Replete as needed Repeat pm Mag labs  Anemia Likely multifactorial.  Has received multiple PRBC transfusions this admissions.   Avoid ESA given malignancy Continue transfusions per primary MD   Carollee Leitz, MD [facility]  02/18/2020, 7:31 AM  Recent Labs  Lab 02/16/20 0610 02/17/20 0438 02/18/20 0314  NA 136 139 138  K 3.7 3.7 3.4*  CL 107 110 110  CO2 18* 20* 17*  GLUCOSE 109* 114* 115*  BUN 83* 85* 95*  CREATININE 8.27* 7.63* 7.70*  CALCIUM  6.9* 7.1* 6.9*   Recent Labs  Lab 02/14/20 0906 02/14/20 0906 02/15/20 0131 02/15/20 0131 02/16/20 0610 02/16/20 0610 02/16/20 1051 02/17/20 0438 02/18/20 0314  WBC 6.7   < > 6.6   < > 5.8   < > 5.9 6.1 6.6  NEUTROABS 4.5  --  4.7  --  4.0  --   --   --   --   HGB 8.0*   < > 7.4*   < > 6.3*   < > 6.8* 7.8* 6.7*  HCT 24.0*   < > 22.7*   < > 19.6*   < > 21.0* 23.7* 19.9*  MCV 100.8*   < > 100.9*   < > 101.6*   < > 101.9* 96.3 96.1  PLT 44*   < > 53*   < > 58*   < > 60* 76* 80*    < > = values in this interval not displayed.    Plan communicated to the primary team

## 2020-02-18 NOTE — Progress Notes (Signed)
Occupational Therapy Treatment Patient Details Name: [name] Rose MRN: [id number] DOB: 1950/07/14 Today's Date: 02/18/2020    History of present illness 69 yo male admitted with FTT, pancytopenia, thrombocytopenia, sepsis, weakness. hx of met prostate ca with bladder mets, Afib, CKD   OT comments  Pt receiving blood with Hgb of 6.7, limited session to UB bathing and grooming seated at EOB. Pt needing moderate assistance (to wash back and apply lotion) for UB bathing and set up for oral care and washing face and hands. Will continue to follow.  Follow Up Recommendations  Home health OT    Equipment Recommendations  None recommended by OT    Recommendations for Other Services      Precautions / Restrictions Precautions Precautions: Fall       Mobility Bed Mobility Overal bed mobility: Needs Assistance Bed Mobility: Supine to Sit;Sit to Supine     Supine to sit: Supervision Sit to supine: Supervision   General bed mobility comments: HOB up  Transfers                 General transfer comment: session limited to EOB as pt receiving blood (hgb 6.7)    Balance Overall balance assessment: Needs assistance   Sitting balance-Leahy Scale: Good                                     ADL either performed or assessed with clinical judgement   ADL Overall ADL's : Needs assistance/impaired     Grooming: Wash/dry hands;Wash/dry face;Oral care;Sitting;Supervision/safety   Upper Body Bathing: Moderate assistance;Sitting Upper Body Bathing Details (indicate cue type and reason): washed, dried and applied lotion to back         Lower Body Dressing: Total assistance;Bed level Lower Body Dressing Details (indicate cue type and reason): socks                     Vision       Perception     Praxis      Cognition Arousal/Alertness: Awake/alert Behavior During Therapy: WFL for tasks assessed/performed Overall Cognitive Status: Within  Functional Limits for tasks assessed                                 General Comments: pt aggravated his was told he could eat and then that his is NPO for testing, per RN pt is allowed ice chips, provided for pt        Exercises     Shoulder Instructions       General Comments      Pertinent Vitals/ Pain       Pain Assessment: No/denies pain  Home Living                                          Prior Functioning/Environment              Frequency  Min 2X/week        Progress Toward Goals  OT Goals(current goals can now be found in the care plan section)  Progress towards OT goals: Not progressing toward goals - comment (limited by low hgb)  Acute Rehab OT Goals Patient Stated Goal: get stronger and go home OT Goal Formulation: With  patient Time For Goal Achievement: 03/03/20 Potential to Achieve Goals: Good  Plan Discharge plan remains appropriate    Co-evaluation                 AM-PAC OT "6 Clicks" Daily Activity     Outcome Measure   Help from another person eating meals?: None Help from another person taking care of personal grooming?: A Little Help from another person toileting, which includes using toliet, bedpan, or urinal?: Total Help from another person bathing (including washing, rinsing, drying)?: A Lot Help from another person to put on and taking off regular upper body clothing?: A Little Help from another person to put on and taking off regular lower body clothing?: Total 6 Click Score: 14    End of Session    OT Visit Diagnosis: Muscle weakness (generalized) (M62.81)   Activity Tolerance Treatment limited secondary to medical complications (Comment) (low hgb)   Patient Left in bed;with call bell/phone within reach   Nurse Communication          Time: 2878-6767 OT Time Calculation (min): 18 min  Charges: OT General Charges $OT Visit: 1 Visit OT Treatments $Self Care/Home Management :  8-22 mins  Nestor Lewandowsky, OTR/L Acute Rehabilitation Services Pager: [phone] Office: [phone]   Malka So 02/18/2020, 10:34 AM

## 2020-02-18 NOTE — Progress Notes (Signed)
Unfortunately, the probably now have is that there is some GI bleeding.  I was wondering why his hemoglobin was dropping.  He was transfused over the weekend.  His hemoglobin is 6.7 this morning.  He is having some melena.  He probably has platelet dysfunction because of the renal failure.  I will give him a unit of platelets just because of this.  His platelet count is actually coming out.  It is 80,000.  I will also check a PT and PTT on him.  I just hate that this is happening.  Things were going so well for him.  His renal failure was slowly improving.  Today, his BUN was 95 creatinine 7.7.  The BUN probably is up because of the GI bleeding.  His white cell count 6.6.  Hemoglobin 6.7.  Platelet count 80,000.  He was eating okay.  He is not having any nausea or vomiting.  He is having no fever.  He is also into a more rapid in atrial fibrillation.  The Eliquis has been stopped.  I suspect he is probably going need GI evaluation for the GI blood loss.  He has had no rashes.  He has little bit of itching.  Overall, we will just have to see how this GI bleeding goes.  We will give him a dose of Procrit today.  I did state that he has some notes going on.  Everything was really getting better for him.  His renal failure was improving nicely.  He was eating without any problems.  He was having some diarrhea.  Hopefully, this GI bleeding is going be self-limited.  [name], MD [facility] [phone]

## 2020-02-18 NOTE — Progress Notes (Signed)
HR started to go in the 120's and converted back to Afib RVR after receiving the LR and the  1 run of Mg, MD ordered another run of Mg, since his Mg level was 0.9, after completion of this, MD ordered Cardizem IV to help with the Afib which was later started at $RemoveBe'5mg'ySCvTGhvO$ , will continue to monitor, Thanks Arvella Nigh RN.

## 2020-02-18 NOTE — H&P (View-Only) (Signed)
Referring Provider: [facility] Primary Care Physician:  Shirline Frees, MD Primary Gastroenterologist:  Dr. Cristina Gong [facility])  Reason for Consultation:  Anemia, melena  HPI: [name] Rose is a 69 y.o. male with past medical history pertinent for metastatic bladder cancer (on chemotherapy), A. fib (on Eliquis), and CKD presenting for consultation of anemia and melena.  Per RN, patient has had melenic stools for the past couple of days.  Patient states that prior to admission, he did not have any melena or hematochezia.  Patient had 2 episodes of 100-150cc melena last night with resultant drop in hemoglobin from 7.8-6.7.  No hematochezia during admission.  Patient denies any abdominal pain, nausea, vomiting, hematemesis, dysphagia.  Further denies any changes in appetite, unexplained weight loss.  He is on Eliquis.  Denies aspirin or NSAID use.  Previously used Aleve as needed but has not taken any for several months.  Denies any prior history of GI bleeding.  Denies family history of colon cancer or gastrointestinal malignancy.  Last colonoscopy 08/2016: diverticulosis, otherwise normal  Past Medical History:  Diagnosis Date  . Anemia   . Arthritis   . 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 ([place]) 09/29/2016  . Bladder tumor   . Chronic kidney disease    ckd stage 3, sees dr every 6 months, arf hemodialysis done x 2 2020  . Dyspnea    with exertion  . Dysrhythmia    atrial fib  . Essential hypertension 06/23/2018  . Goals of care, counseling/discussion 09/30/2016  . History of prostate cancer followed by pcp dr [name]-  per pt last PSA undetectable   dx 2008-- (Stage T1c, Gleason 3+3,  PSA 4.58, vol 99cc)  s/p  radical prostatectomy (nerve sparing bilateral)   . Hyperglycemia 06/23/2018  . Hypertension   . Mild hyperlipidemia 06/23/2018  . Pre-diabetes   . Wears glasses     Past Surgical History:  Procedure Laterality Date  . CARDIOVERSION N/A 05/18/2019    Procedure: CARDIOVERSION;  Surgeon: Lelon Perla, MD;  Location: [facility] ENDOSCOPY;  Service: Cardiovascular;  Laterality: N/A;  . CARDIOVERSION N/A 09/27/2019   Procedure: CARDIOVERSION;  Surgeon: Donato Heinz, MD;  Location: [place];  Service: Cardiovascular;  Laterality: N/A;  . CATARACT EXTRACTION W/ INTRAOCULAR LENS  IMPLANT, BILATERAL Bilateral 2011  . [place]  . IR FLUORO GUIDE PORT INSERTION RIGHT  10/07/2016  . IR RADIOLOGIST EVAL & MGMT  01/05/2018  . IR US GUIDE VASC ACCESS RIGHT  10/07/2016  . KNEE ARTHROSCOPY Bilateral right 2006;  left 02-15-2007  . [place];  1990;  1983  . ROBOT ASSISTED LAPAROSCOPIC RADICAL PROSTATECTOMY  06/20/2006   bilateral nerve sparing  . TEE WITHOUT CARDIOVERSION N/A 06/27/2018   Procedure: TRANSESOPHAGEAL ECHOCARDIOGRAM (TEE);  Surgeon: Nigel Mormon, MD;  Location: [facility] ENDOSCOPY;  Service: Cardiovascular;  Laterality: N/A;  . TOTAL HIP ARTHROPLASTY Left 03/03/2015   Procedure: LEFT TOTAL HIP ARTHROPLASTY ANTERIOR APPROACH;  Surgeon: Dorna Leitz, MD;  Location: [place];  Service: Orthopedics;  Laterality: Left;  . TOTAL KNEE ARTHROPLASTY Bilateral left 08-13-2009;  right 12-26-2009  . TRANSURETHRAL RESECTION OF BLADDER TUMOR N/A 09/20/2016   Procedure: TRANSURETHRAL RESECTION OF BLADDER TUMOR (TURBT);  Surgeon: Franchot Gallo, MD;  Location: [facility];  Service: Urology;  Laterality: N/A;  . TRANSURETHRAL RESECTION OF BLADDER TUMOR WITH MITOMYCIN-C N/A 08/06/2019   Procedure: TRANSURETHRAL RESECTION OF BLADDER TUMOR;  Surgeon: Franchot Gallo, MD;  Location: [place]  [facility];  Service: Urology;  Laterality: N/A;    Prior to Admission medications   Medication Sig Start Date End Date Taking? Authorizing Provider  acetaminophen (TYLENOL) 500 MG tablet Take 1,000 mg by mouth every 6 (six) hours as needed for mild pain or headache.   Yes [provider]   apixaban (ELIQUIS) 5 MG TABS tablet Take 1 tablet (5 mg total) by mouth 2 (two) times daily. 03/19/19  Yes Volanda Napoleon, MD  atorvastatin (LIPITOR) 40 MG tablet Take 40 mg by mouth every evening.    Yes [provider]  Cholecalciferol (VITAMIN D-3 PO) Take 1 capsule by mouth daily with breakfast.   Yes [provider]  diltiazem (CARDIZEM CD) 360 MG 24 hr capsule TAKE 1 CAPSULE BY MOUTH EVERY DAY Patient taking differently: Take 360 mg by mouth daily.  09/21/19  Yes Hilty, Nadean Corwin, MD  dofetilide (TIKOSYN) 250 MCG capsule Take 1 capsule (250 mcg total) by mouth 2 (two) times daily. 09/28/19  Yes Shirley Friar, PA-C  DULoxetine (CYMBALTA) 60 MG capsule TAKE ONE CAPSULE BY MOUTH DAILY 01/21/20  Yes Ennever, Rudell Cobb, MD  gabapentin (NEURONTIN) 400 MG capsule TAKE 1 CAPSULE (400 MG TOTAL) BY MOUTH 4 (FOUR) TIMES DAILY. Patient taking differently: Take 800 mg by mouth at bedtime.  09/11/19  Yes Volanda Napoleon, MD  lidocaine-prilocaine (EMLA) cream Apply to affected area once 12/31/19  Yes Ennever, Rudell Cobb, MD  loperamide (IMODIUM A-D) 2 MG tablet Take 2 at diarrhea onset, then 1 every 2hr until 12hrs with no BM. May take 2 every 4hrs at night. If diarrhea recurs repeat. 12/31/19  Yes Volanda Napoleon, MD  magnesium oxide (MAG-OX) 400 MG tablet Take 1 tablet (400 mg total) by mouth 2 (two) times daily. 10/05/19  Yes Sherran Needs, NP  metoprolol succinate (TOPROL-XL) 50 MG 24 hr tablet TAKE ONE TABLET BY MOUTH EVERY MORNING AND TAKE ONE TABLET BY MOUTH EVERY NIGHT AT BEDTIME Patient taking differently: Take 50 mg by mouth 2 (two) times daily.  01/01/20  Yes Sherran Needs, NP  oxybutynin (DITROPAN-XL) 10 MG 24 hr tablet Take 10 mg by mouth at bedtime.   Yes [provider]  potassium chloride (KLOR-CON) 10 MEQ tablet Take 2 tablets (20 mEq total) by mouth daily. 12/31/19 03/30/20 Yes Baldwin Jamaica, PA-C  prochlorperazine (COMPAZINE) 10 MG tablet Take 1 tablet  (10 mg total) by mouth every 6 (six) hours as needed (Nausea or vomiting). Patient taking differently: Take 10 mg by mouth as needed (Nausea or vomiting).  12/31/19  Yes Volanda Napoleon, MD  pyridoxine (B-6) 100 MG tablet Take 200 mg by mouth daily with breakfast.    Yes [provider]  amoxicillin (AMOXIL) 500 MG capsule TAKE 4 CAPS (2,000 MG TOTAL) BY MOUTH ONCE FOR 1 DOSE. TAKE 1 HOUR PRIOR TO DENTAL PROCEDURE 08/31/19   Volanda Napoleon, MD  cyclobenzaprine (FEXMID) 7.5 MG tablet Take 1 tablet (7.5 mg total) by mouth 3 (three) times daily as needed for muscle spasms. Patient not taking: Reported on 01/28/2020 12/31/19   Volanda Napoleon, MD  dexamethasone (DECADRON) 4 MG tablet Take 2 tablets (8 mg) daily for 3 days after chemotherapy. Take with food. 12/31/19   Volanda Napoleon, MD  K Phos Mono-Sod Phos Di & Mono (K-PHOS-NEUTRAL) [phone] MG TABS Take 1 tablet by mouth 2 (two) times daily. Patient not taking: Reported on 01/28/2020 11/22/19   [provider]  LORazepam (ATIVAN)  0.5 MG tablet Take 1 tablet (0.5 mg total) by mouth every 6 (six) hours as needed (Nausea or vomiting). Patient not taking: Reported on 02/03/2020 12/31/19   Volanda Napoleon, MD  traMADol (ULTRAM) 50 MG tablet Take 50 mg by mouth daily as needed (for pain).  Patient not taking: Reported on 02/03/2020 03/16/19   [provider]    Scheduled Meds: . sodium chloride   Intravenous Once  . sodium chloride   Intravenous Once  . sodium chloride   Intravenous Once  . Chlorhexidine Gluconate Cloth  6 each Topical Daily  . epoetin alfa  40,000 Units Subcutaneous Once  . metoprolol tartrate  50 mg Oral BID  . oxybutynin  10 mg Oral QHS  . pantoprazole (PROTONIX) IV  40 mg Intravenous Q12H  . sodium chloride flush  10-40 mL Intracatheter Q12H   Continuous Infusions: . sodium chloride    . diltiazem (CARDIZEM) infusion 5 mg/hr (02/18/20 0738)   PRN Meds:.acetaminophen **OR** [DISCONTINUED]  acetaminophen, diphenhydrAMINE, Gerhardt's butt cream, hydrOXYzine, melatonin, ondansetron **OR** ondansetron (ZOFRAN) IV, sodium chloride flush  Allergies as of 02/03/2020  . (No Known Allergies)    Family History  Problem Relation Age of Onset  . Hypertension Mother   . Aneurysm Mother   . Emphysema Father   . Hypertension Father     Social History   Socioeconomic History  . Marital status: Married    Spouse name: Not on file  . Number of children: Not on file  . Years of education: Not on file  . Highest education level: Not on file  Occupational History  . Not on file  Tobacco Use  . Smoking status: Former Smoker    Packs/day: 1.00    Years: 16.00    Pack years: 16.00    Types: Cigarettes    Quit date: 09/25/1984    Years since quitting: 35.4  . Smokeless tobacco: Never Used  Vaping Use  . Vaping Use: Never used  Substance and Sexual Activity  . Alcohol use: Yes    Comment: occasionally  . Drug use: Yes    Types: Marijuana    Comment: after chemo to help sleep  . Sexual activity: Not on file  Other Topics Concern  . Not on file  Social History Narrative  . Not on file   Social Determinants of Health   Financial Resource Strain:   . Difficulty of Paying Living Expenses: Not on file  Food Insecurity:   . Worried About Charity fundraiser in the Last Year: Not on file  . Ran Out of Food in the Last Year: Not on file  Transportation Needs:   . Lack of Transportation (Medical): Not on file  . Lack of Transportation (Non-Medical): Not on file  Physical Activity:   . Days of Exercise per Week: Not on file  . Minutes of Exercise per Session: Not on file  Stress:   . Feeling of Stress : Not on file  Social Connections:   . Frequency of Communication with Friends and Family: Not on file  . Frequency of Social Gatherings with Friends and Family: Not on file  . Attends Religious Services: Not on file  . Active Member of Clubs or Organizations: Not on file  .  Attends Archivist Meetings: Not on file  . Marital Status: Not on file  Intimate Partner Violence:   . Fear of Current or Ex-Partner: Not on file  . Emotionally Abused: Not on file  .  Physically Abused: Not on file  . Sexually Abused: Not on file    Review of Systems: Review of Systems  Constitutional: Negative for chills, fever and weight loss.  HENT: Negative for hearing loss and tinnitus.   Eyes: Negative for pain and redness.  Respiratory: Negative for cough and shortness of breath.   Cardiovascular: Negative for chest pain and palpitations.  Gastrointestinal: Positive for diarrhea and melena. Negative for abdominal pain, blood in stool, constipation, heartburn, nausea and vomiting.  Genitourinary: Negative for flank pain and hematuria.  Musculoskeletal: Negative for falls and joint pain.  Skin: Negative for itching and rash.  Neurological: Negative for seizures and loss of consciousness.  Endo/Heme/Allergies: Negative for polydipsia. Does not bruise/bleed easily.  Psychiatric/Behavioral: Negative for substance abuse. The patient is not nervous/anxious.      Physical Exam: Vital signs: Vitals:   02/18/20 0757 02/18/20 0823  BP: 117/72 122/85  Pulse: (!) 102 (!) 110  Resp: 16 16  Temp: 97.9 F (36.6 C) (!) 97.4 F (36.3 C)  SpO2: 99% 94%   Last BM Date: 02/15/20 Physical Exam Constitutional:      General: He is not in acute distress.    Appearance: He is obese.  HENT:     Head: Normocephalic and atraumatic.     Nose: Nose normal. No congestion.     Mouth/Throat:     Mouth: Mucous membranes are moist.     Pharynx: Oropharynx is clear.  Eyes:     General: No scleral icterus.    Extraocular Movements: Extraocular movements intact.     Comments: Conjunctival pallor  Cardiovascular:     Rate and Rhythm: Tachycardia present. Rhythm irregular.     Pulses: Normal pulses.  Pulmonary:     Effort: Pulmonary effort is normal. No respiratory distress.      Breath sounds: Normal breath sounds.  Abdominal:     General: Bowel sounds are normal. There is no distension.     Palpations: Abdomen is soft. There is no mass.     Tenderness: There is no abdominal tenderness. There is no guarding or rebound.     Hernia: No hernia is present.  Musculoskeletal:        General: No tenderness.     Cervical back: Normal range of motion and neck supple.     Right lower leg: Edema present.     Left lower leg: Edema present.  Skin:    General: Skin is warm and dry.  Neurological:     General: No focal deficit present.     Mental Status: He is alert and oriented to person, place, and time.  Psychiatric:        Mood and Affect: Mood normal.        Behavior: Behavior normal.      GI:  Lab Results: Recent Labs    02/16/20 1051 02/17/20 0438 02/18/20 0314  WBC 5.9 6.1 6.6  HGB 6.8* 7.8* 6.7*  HCT 21.0* 23.7* 19.9*  PLT 60* 76* 80*   BMET Recent Labs    02/16/20 0610 02/17/20 0438 02/18/20 0314  NA 136 139 138  K 3.7 3.7 3.4*  CL 107 110 110  CO2 18* 20* 17*  GLUCOSE 109* 114* 115*  BUN 83* 85* 95*  CREATININE 8.27* 7.63* 7.70*  CALCIUM 6.9* 7.1* 6.9*   LFT No results for input(s): PROT, ALBUMIN, AST, ALT, ALKPHOS, BILITOT, BILIDIR, IBILI in the last 72 hours. PT/INR Recent Labs    02/18/20 0746  LABPROT 17.6*  INR 1.5*     Studies/Results: No results found.  Impression: Suspected upper GI bleeding in the setting of Eliquis use: Anemia and melena -Hemoglobin 6.8 on 11/6, inadequate rise to 7.8 after 2u pRBCs, decreased today to 6.7 -Last dose of Eliquis ~8PM 11/7 -Elevated ferritin (1500) -INR 1.5  A. fib with RVR: On Eliquis  Metastatic bladder cancer on chemotherapy  Thrombocytopenia: Platelets 80K/uL  Acute on chronic kidney disease: BUN 95/creatinine 7.70, renal following  Plan: Patient currently stable, and since last dose of Eliquis was given at 8 PM last evening, we will plan for EGD tomorrow.  I thoroughly  discussed the procedure with the patient to include nature, alternatives, benefits, and risks (including but not limited to bleeding, infection, perforation, anesthesia/cardiac and pulmonary complications).  Patient verbalized understanding and gave verbal consent to proceed with EGD.  Clear liquid diet with n.p.o. after midnight.  Initiate Protonix drip.  Continue to monitor H&H with transfusion as needed to maintain hemoglobin greater than 7.  [facility] will follow.   LOS: 15 days   Salley Slaughter  PA-C 02/18/2020, 8:48 AM  Contact #  [phone]

## 2020-02-18 NOTE — Progress Notes (Signed)
Hgb 6.7, MD notified, will continue to monitor, Thanks Arvella Nigh RN.

## 2020-02-18 NOTE — Progress Notes (Signed)
[facility] PROGRESS NOTE    Progress Note  [name] Rose  DSK:[id number] DOB: June 16, 1950 DOA: 02/03/2020 PCP: Shirline Frees, MD     Brief Narrative:   [name] Rose is an 69 y.o. male past medical history significant for metastatic bladder cancer currently on chemotherapy, paroxysmal atrial fibrillation, chronic kidney disease stage 3 comes into the ED for generalized weakness and diarrhea that started 4 days prior to admission.  On admission he was found to be septic with neutropenia with started on antibiotics oncology was consulted blood cultures grew Klebsiella.  Hospital course complicated by acute kidney injury and rectal and NG tube bleeding on 02/10/2020 he was transfused 1 unit of red blood cells, was started on IV Protonix, subsequently developed ileus which is slowly improving.  Assessment/Plan:   Acute renal failure with acute tubular necrosis superimposed on stage 3 chronic kidney disease ([name] Rose): In the setting of sepsis and bladder cancer on chemo. Leading to ATN, his creatinine this morning is 7.3 has had good urine output about 3.4 L. He has no uremic symptoms, his urine output has decreased ahead and start him on IV fluids. Appreciate renal's assistance.  Recurrent GI bleed/chemotherapy-induced anemia: His hemoglobin dropped again overnight he is status post 3 units of packed red blood cells.  He will be transfused 2 units today again on 02/18/2020.  Check a CBC posttransfusion all. Change his Protonix to IV. GI has already been consulted will place patient n.p.o.  Vomiting and diarrhea after chemotherapy now resolved, with ileus: X-ray was improved on 02/12/2020. Resolved stop Metamucil and Lomotil.  Klebsiella bacteremia/severe sepsis: He completed his course of antibiotic in house.  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 ([name] Rose) Appreciate oncology's assistance.  Acute thrombocytopenia: Related to chemotherapy in the setting of  sepsis. Appreciate oncology's assistance, he will receive an additional unit of packed red blood cells today to help with bleeding.  A. fib with RVR: With a chads Vascor greater than 2. Rate is currently controlled, Eliquis was held due to GI bleed and thrombocytopenia, Tikosyn is also being held due to acute kidney injury. His rate is controlled metoprolol and Cardizem.  Hypokalemia: Replete orally basic metabolic panel is pending this morning.  DVT prophylaxis: scd Family Communication:none Status is: Inpatient  Remains inpatient appropriate because:Hemodynamically unstable   Dispo: The patient is from: Home              Anticipated d/c is to: Home              Anticipated d/c date is: 3 days              Patient currently is not medically stable to d/c.  Code Status:     Code Status Orders  (From admission, onward)         Start     Ordered   02/03/20 1433  Full code  Continuous        02/03/20 1432        Code Status History    Date Active Date Inactive Code Status Order ID Comments User Context   09/25/2019 1405 09/28/2019 2249 Full Code [id number]  Sherran Needs, NP Inpatient   09/08/2018 1209 10/14/2018 1649 Full Code [id number]  Mariel Aloe, MD Inpatient   06/21/2018 1713 06/29/2018 1559 Full Code [id number]  Georgette Shell, MD ED   12/19/2017 1726 12/23/2017 1453 DNR [id number]  Alma Friendly, MD Inpatient   10/17/2016 1231 10/21/2016 1911 Full Code [id number]  Posey Pronto,  Josetta Huddle, MD Inpatient   09/20/2016 1009 09/21/2016 1229 Full Code [id number]  Franchot Gallo, MD Inpatient   03/03/2015 1929 03/05/2015 1428 Full Code [id number]  Gary Fleet, PA-C Inpatient   Advance Care Planning Activity    Advance Directive Documentation     Most Recent Value  Type of Advance Directive Healthcare Power of Attorney  Pre-existing out of facility DNR order (yellow form or pink MOST form) --  "MOST" Form in Place? --        IV Access:    Peripheral  IV   Procedures and diagnostic studies:   No results found.   Medical Consultants:    None.  Anti-Infectives:   kelfex  Subjective:    [name] Rose denies any abdominal pain, he does know if he had melanotic stools or bright red blood per rectum.  He is groggy this morning in a bad mood.  Objective:    Vitals:   02/18/20 0558 02/18/20 0705 02/18/20 0731 02/18/20 0757  BP: 127/87  122/79 117/72  Pulse:  (!) 118 (!) 120 (!) 102  Resp:    16  Temp:    97.9 F (36.6 C)  TempSrc:    Oral  SpO2:    99%  Weight:      Height:       SpO2: 99 % O2 Flow Rate (L/min): 0 L/min   Intake/Output Summary (Last 24 hours) at 02/18/2020 0805 Last data filed at 02/18/2020 0757 Gross per 24 hour  Intake 740 ml  Output 2253 ml  Net -1513 ml   Filed Weights   02/16/20 0302 02/17/20 0337 02/18/20 0024  Weight: 127 kg 127 kg 123.8 kg    Exam: General exam: In no acute distress. Respiratory system: Good air movement and clear to auscultation. Cardiovascular system: S1 & S2 heard, RRR. No JVD. Gastrointestinal system: Abdomen is nondistended, soft and nontender.  Extremities: No pedal edema. Skin: No rashes, lesions or ulcers  Data Reviewed:    Labs: Basic Metabolic Panel: Recent Labs  Lab 02/14/20 0906 02/14/20 0906 02/15/20 0131 02/15/20 0131 02/16/20 0610 02/16/20 0610 02/17/20 0438 02/18/20 0314  NA 139  --  137  --  136  --  139 138  K 3.0*   < > 4.0   < > 3.7   < > 3.7 3.4*  CL 108  --  109  --  107  --  110 110  CO2 19*  --  17*  --  18*  --  20* 17*  GLUCOSE 128*  --  113*  --  109*  --  114* 115*  BUN 93*  --  88*  --  83*  --  85* 95*  CREATININE 8.66*  --  8.42*  --  8.27*  --  7.63* 7.70*  CALCIUM 7.1*  --  7.2*  --  6.9*  --  7.1* 6.9*  MG  --   --   --   --   --   --   --  0.9*   < > = values in this interval not displayed.   GFR Estimated Creatinine Clearance: 12.8 mL/min (A) (by C-G formula based on SCr of 7.7 mg/dL (H)). Liver Function  Tests: Recent Labs  Lab 02/12/20 0850  AST 23  ALT 5  ALKPHOS 48  BILITOT 0.9  PROT 4.3*  ALBUMIN 2.0*   No results for input(s): LIPASE, AMYLASE in the last 168 hours. No results for input(s): AMMONIA in the last 168  hours. Coagulation profile No results for input(s): INR, PROTIME in the last 168 hours. COVID-19 Labs  No results for input(s): DDIMER, FERRITIN, LDH, CRP in the last 72 hours.  Lab Results  Component Value Date   [place] NEGATIVE 02/03/2020   [place] NEGATIVE 09/22/2019   [place] NEGATIVE 08/02/2019   [place] NEGATIVE 05/15/2019    CBC: Recent Labs  Lab 02/12/20 0850 02/12/20 0850 02/13/20 0802 02/13/20 0802 02/14/20 0906 02/14/20 0906 02/15/20 0131 02/16/20 0610 02/16/20 1051 02/17/20 0438 02/18/20 0314  WBC 7.6   < > 7.4   < > 6.7   < > 6.6 5.8 5.9 6.1 6.6  NEUTROABS 5.2  --  5.0  --  4.5  --  4.7 4.0  --   --   --   HGB 7.9*   < > 8.2*   < > 8.0*   < > 7.4* 6.3* 6.8* 7.8* 6.7*  HCT 24.0*   < > 24.9*   < > 24.0*   < > 22.7* 19.6* 21.0* 23.7* 19.9*  MCV 100.8*   < > 100.8*   < > 100.8*   < > 100.9* 101.6* 101.9* 96.3 96.1  PLT 36*   < > 41*   < > 44*   < > 53* 58* 60* 76* 80*   < > = values in this interval not displayed.   Cardiac Enzymes: No results for input(s): CKTOTAL, CKMB, CKMBINDEX, TROPONINI in the last 168 hours. BNP (last 3 results) No results for input(s): PROBNP in the last 8760 hours. CBG: No results for input(s): GLUCAP in the last 168 hours. D-Dimer: No results for input(s): DDIMER in the last 72 hours. Hgb A1c: No results for input(s): HGBA1C in the last 72 hours. Lipid Profile: No results for input(s): CHOL, HDL, LDLCALC, TRIG, CHOLHDL, LDLDIRECT in the last 72 hours. Thyroid function studies: No results for input(s): TSH, T4TOTAL, T3FREE, THYROIDAB in the last 72 hours.  Invalid input(s): FREET3 Anemia work up: No results for input(s): VITAMINB12, FOLATE, FERRITIN, TIBC, IRON, RETICCTPCT in the last  72 hours. Sepsis Labs: Recent Labs  Lab 02/16/20 0610 02/16/20 1051 02/17/20 0438 02/18/20 0314  WBC 5.8 5.9 6.1 6.6   Microbiology Recent Results (from the past 240 hour(s))  Culture, blood (Routine X 2) w Reflex to ID Panel     Status: None   Collection Time: 02/09/20 10:48 AM   Specimen: BLOOD  Result Value Ref Range Status   Specimen Description   Final    BLOOD LEFT ARM Performed at [facility] [address] [place], [place] [address]    Special Requests   Final    BOTTLES DRAWN AEROBIC AND ANAEROBIC Blood Culture adequate volume Performed at [facility] [address], [place], [place] [address]    Culture   Final    NO GROWTH 5 DAYS Performed at [facility], [place] [address], [place], [place] [address]    Report Status 02/14/2020 FINAL  Final  Culture, blood (Routine X 2) w Reflex to ID Panel     Status: None   Collection Time: 02/09/20 10:50 AM   Specimen: BLOOD  Result Value Ref Range Status   Specimen Description   Final    BLOOD LEFT ARM Performed at [facility] [address] [place], [place] [address]    Special Requests   Final    BOTTLES DRAWN AEROBIC AND ANAEROBIC Blood Culture adequate volume Performed at [facility], [address] [place], [place] [address]    Culture   Final    NO GROWTH 5 DAYS Performed at [facility], [place] [address], [place], [place] [address]    Report Status 02/14/2020 FINAL  Final     Medications:   . sodium chloride   Intravenous Once  . sodium chloride   Intravenous Once  . sodium chloride   Intravenous Once  . Chlorhexidine Gluconate Cloth  6 each Topical Daily  . epoetin alfa  40,000 Units Subcutaneous Once  . metoprolol tartrate  50 mg Oral BID  . oxybutynin  10 mg Oral QHS  . pantoprazole (PROTONIX) IV  40 mg Intravenous Q12H  . sodium chloride flush  10-40 mL Intracatheter Q12H   Continuous Infusions: .  diltiazem (CARDIZEM) infusion 5 mg/hr (02/18/20 0738)      LOS: 15 days   Charlynne Cousins  [facility]  02/18/2020, 8:05 AM

## 2020-02-18 NOTE — Progress Notes (Signed)
Wife is at the bedside and provided an update on the patient's current condition and plan of care - with the patient's consent.

## 2020-02-18 NOTE — Consult Note (Signed)
Referring Provider: [facility] Primary Care Physician:  Shirline Frees, MD Primary Gastroenterologist:  Dr. Cristina Gong [facility])  Reason for Consultation:  Anemia, melena  HPI: [name] Rose is a 69 y.o. male with past medical history pertinent for metastatic bladder cancer (on chemotherapy), A. fib (on Eliquis), and CKD presenting for consultation of anemia and melena.  Per RN, patient has had melenic stools for the past couple of days.  Patient states that prior to admission, he did not have any melena or hematochezia.  Patient had 2 episodes of 100-150cc melena last night with resultant drop in hemoglobin from 7.8-6.7.  No hematochezia during admission.  Patient denies any abdominal pain, nausea, vomiting, hematemesis, dysphagia.  Further denies any changes in appetite, unexplained weight loss.  He is on Eliquis.  Denies aspirin or NSAID use.  Previously used Aleve as needed but has not taken any for several months.  Denies any prior history of GI bleeding.  Denies family history of colon cancer or gastrointestinal malignancy.  Last colonoscopy 08/2016: diverticulosis, otherwise normal  Past Medical History:  Diagnosis Date  . Anemia   . Arthritis   . 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 ([name]) 09/29/2016  . Bladder tumor   . Chronic kidney disease    ckd stage 3, sees dr every 6 months, arf hemodialysis done x 2 2020  . Dyspnea    with exertion  . Dysrhythmia    atrial fib  . Essential hypertension 06/23/2018  . Goals of care, counseling/discussion 09/30/2016  . History of prostate cancer followed by pcp dr [name]-  per pt last PSA undetectable   dx 2008-- (Stage T1c, Gleason 3+3,  PSA 4.58, vol 99cc)  s/p  radical prostatectomy (nerve sparing bilateral)   . Hyperglycemia 06/23/2018  . Hypertension   . Mild hyperlipidemia 06/23/2018  . Pre-diabetes   . Wears glasses     Past Surgical History:  Procedure Laterality Date  . CARDIOVERSION N/A 05/18/2019    Procedure: CARDIOVERSION;  Surgeon: Lelon Perla, MD;  Location: [facility] ENDOSCOPY;  Service: Cardiovascular;  Laterality: N/A;  . CARDIOVERSION N/A 09/27/2019   Procedure: CARDIOVERSION;  Surgeon: Donato Heinz, MD;  Location: [place];  Service: Cardiovascular;  Laterality: N/A;  . CATARACT EXTRACTION W/ INTRAOCULAR LENS  IMPLANT, BILATERAL Bilateral 2011  . [place]  . IR FLUORO GUIDE PORT INSERTION RIGHT  10/07/2016  . IR RADIOLOGIST EVAL & MGMT  01/05/2018  . IR US GUIDE VASC ACCESS RIGHT  10/07/2016  . KNEE ARTHROSCOPY Bilateral right 2006;  left 02-15-2007  . [place];  1990;  1983  . ROBOT ASSISTED LAPAROSCOPIC RADICAL PROSTATECTOMY  06/20/2006   bilateral nerve sparing  . TEE WITHOUT CARDIOVERSION N/A 06/27/2018   Procedure: TRANSESOPHAGEAL ECHOCARDIOGRAM (TEE);  Surgeon: Nigel Mormon, MD;  Location: [facility] ENDOSCOPY;  Service: Cardiovascular;  Laterality: N/A;  . TOTAL HIP ARTHROPLASTY Left 03/03/2015   Procedure: LEFT TOTAL HIP ARTHROPLASTY ANTERIOR APPROACH;  Surgeon: Dorna Leitz, MD;  Location: [place];  Service: Orthopedics;  Laterality: Left;  . TOTAL KNEE ARTHROPLASTY Bilateral left 08-13-2009;  right 12-26-2009  . TRANSURETHRAL RESECTION OF BLADDER TUMOR N/A 09/20/2016   Procedure: TRANSURETHRAL RESECTION OF BLADDER TUMOR (TURBT);  Surgeon: Franchot Gallo, MD;  Location: [facility];  Service: Urology;  Laterality: N/A;  . TRANSURETHRAL RESECTION OF BLADDER TUMOR WITH MITOMYCIN-C N/A 08/06/2019   Procedure: TRANSURETHRAL RESECTION OF BLADDER TUMOR;  Surgeon: Franchot Gallo, MD;  Location: [facility];  Service: Urology;  Laterality: N/A;    Prior to Admission medications   Medication Sig Start Date End Date Taking? Authorizing Provider  acetaminophen (TYLENOL) 500 MG tablet Take 1,000 mg by mouth every 6 (six) hours as needed for mild pain or headache.   Yes [provider]   apixaban (ELIQUIS) 5 MG TABS tablet Take 1 tablet (5 mg total) by mouth 2 (two) times daily. 03/19/19  Yes Volanda Napoleon, MD  atorvastatin (LIPITOR) 40 MG tablet Take 40 mg by mouth every evening.    Yes [provider]  Cholecalciferol (VITAMIN D-3 PO) Take 1 capsule by mouth daily with breakfast.   Yes [provider]  diltiazem (CARDIZEM CD) 360 MG 24 hr capsule TAKE 1 CAPSULE BY MOUTH EVERY DAY Patient taking differently: Take 360 mg by mouth daily.  09/21/19  Yes Hilty, Nadean Corwin, MD  dofetilide (TIKOSYN) 250 MCG capsule Take 1 capsule (250 mcg total) by mouth 2 (two) times daily. 09/28/19  Yes Shirley Friar, PA-C  DULoxetine (CYMBALTA) 60 MG capsule TAKE ONE CAPSULE BY MOUTH DAILY 01/21/20  Yes Ennever, Rudell Cobb, MD  gabapentin (NEURONTIN) 400 MG capsule TAKE 1 CAPSULE (400 MG TOTAL) BY MOUTH 4 (FOUR) TIMES DAILY. Patient taking differently: Take 800 mg by mouth at bedtime.  09/11/19  Yes Volanda Napoleon, MD  lidocaine-prilocaine (EMLA) cream Apply to affected area once 12/31/19  Yes Ennever, Rudell Cobb, MD  loperamide (IMODIUM A-D) 2 MG tablet Take 2 at diarrhea onset, then 1 every 2hr until 12hrs with no BM. May take 2 every 4hrs at night. If diarrhea recurs repeat. 12/31/19  Yes Volanda Napoleon, MD  magnesium oxide (MAG-OX) 400 MG tablet Take 1 tablet (400 mg total) by mouth 2 (two) times daily. 10/05/19  Yes Sherran Needs, NP  metoprolol succinate (TOPROL-XL) 50 MG 24 hr tablet TAKE ONE TABLET BY MOUTH EVERY MORNING AND TAKE ONE TABLET BY MOUTH EVERY NIGHT AT BEDTIME Patient taking differently: Take 50 mg by mouth 2 (two) times daily.  01/01/20  Yes Sherran Needs, NP  oxybutynin (DITROPAN-XL) 10 MG 24 hr tablet Take 10 mg by mouth at bedtime.   Yes [provider]  potassium chloride (KLOR-CON) 10 MEQ tablet Take 2 tablets (20 mEq total) by mouth daily. 12/31/19 03/30/20 Yes Baldwin Jamaica, PA-C  prochlorperazine (COMPAZINE) 10 MG tablet Take 1 tablet  (10 mg total) by mouth every 6 (six) hours as needed (Nausea or vomiting). Patient taking differently: Take 10 mg by mouth as needed (Nausea or vomiting).  12/31/19  Yes Volanda Napoleon, MD  pyridoxine (B-6) 100 MG tablet Take 200 mg by mouth daily with breakfast.    Yes [provider]  amoxicillin (AMOXIL) 500 MG capsule TAKE 4 CAPS (2,000 MG TOTAL) BY MOUTH ONCE FOR 1 DOSE. TAKE 1 HOUR PRIOR TO DENTAL PROCEDURE 08/31/19   Volanda Napoleon, MD  cyclobenzaprine (FEXMID) 7.5 MG tablet Take 1 tablet (7.5 mg total) by mouth 3 (three) times daily as needed for muscle spasms. Patient not taking: Reported on 01/28/2020 12/31/19   Volanda Napoleon, MD  dexamethasone (DECADRON) 4 MG tablet Take 2 tablets (8 mg) daily for 3 days after chemotherapy. Take with food. 12/31/19   Volanda Napoleon, MD  K Phos Mono-Sod Phos Di & Mono (K-PHOS-NEUTRAL) [phone] MG TABS Take 1 tablet by mouth 2 (two) times daily. Patient not taking: Reported on 01/28/2020 11/22/19   [provider]  LORazepam (ATIVAN)  0.5 MG tablet Take 1 tablet (0.5 mg total) by mouth every 6 (six) hours as needed (Nausea or vomiting). Patient not taking: Reported on 02/03/2020 12/31/19   Volanda Napoleon, MD  traMADol (ULTRAM) 50 MG tablet Take 50 mg by mouth daily as needed (for pain).  Patient not taking: Reported on 02/03/2020 03/16/19   [provider]    Scheduled Meds: . sodium chloride   Intravenous Once  . sodium chloride   Intravenous Once  . sodium chloride   Intravenous Once  . Chlorhexidine Gluconate Cloth  6 each Topical Daily  . epoetin alfa  40,000 Units Subcutaneous Once  . metoprolol tartrate  50 mg Oral BID  . oxybutynin  10 mg Oral QHS  . pantoprazole (PROTONIX) IV  40 mg Intravenous Q12H  . sodium chloride flush  10-40 mL Intracatheter Q12H   Continuous Infusions: . sodium chloride    . diltiazem (CARDIZEM) infusion 5 mg/hr (02/18/20 0738)   PRN Meds:.acetaminophen **OR** [DISCONTINUED]  acetaminophen, diphenhydrAMINE, Gerhardt's butt cream, hydrOXYzine, melatonin, ondansetron **OR** ondansetron (ZOFRAN) IV, sodium chloride flush  Allergies as of 02/03/2020  . (No Known Allergies)    Family History  Problem Relation Age of Onset  . Hypertension Mother   . Aneurysm Mother   . Emphysema Father   . Hypertension Father     Social History   Socioeconomic History  . Marital status: Married    Spouse name: Not on file  . Number of children: Not on file  . Years of education: Not on file  . Highest education level: Not on file  Occupational History  . Not on file  Tobacco Use  . Smoking status: Former Smoker    Packs/day: 1.00    Years: 16.00    Pack years: 16.00    Types: Cigarettes    Quit date: 09/25/1984    Years since quitting: 35.4  . Smokeless tobacco: Never Used  Vaping Use  . Vaping Use: Never used  Substance and Sexual Activity  . Alcohol use: Yes    Comment: occasionally  . Drug use: Yes    Types: Marijuana    Comment: after chemo to help sleep  . Sexual activity: Not on file  Other Topics Concern  . Not on file  Social History Narrative  . Not on file   Social Determinants of Health   Financial Resource Strain:   . Difficulty of Paying Living Expenses: Not on file  Food Insecurity:   . Worried About Charity fundraiser in the Last Year: Not on file  . Ran Out of Food in the Last Year: Not on file  Transportation Needs:   . Lack of Transportation (Medical): Not on file  . Lack of Transportation (Non-Medical): Not on file  Physical Activity:   . Days of Exercise per Week: Not on file  . Minutes of Exercise per Session: Not on file  Stress:   . Feeling of Stress : Not on file  Social Connections:   . Frequency of Communication with Friends and Family: Not on file  . Frequency of Social Gatherings with Friends and Family: Not on file  . Attends Religious Services: Not on file  . Active Member of Clubs or Organizations: Not on file  .  Attends Archivist Meetings: Not on file  . Marital Status: Not on file  Intimate Partner Violence:   . Fear of Current or Ex-Partner: Not on file  . Emotionally Abused: Not on file  .  Physically Abused: Not on file  . Sexually Abused: Not on file    Review of Systems: Review of Systems  Constitutional: Negative for chills, fever and weight loss.  HENT: Negative for hearing loss and tinnitus.   Eyes: Negative for pain and redness.  Respiratory: Negative for cough and shortness of breath.   Cardiovascular: Negative for chest pain and palpitations.  Gastrointestinal: Positive for diarrhea and melena. Negative for abdominal pain, blood in stool, constipation, heartburn, nausea and vomiting.  Genitourinary: Negative for flank pain and hematuria.  Musculoskeletal: Negative for falls and joint pain.  Skin: Negative for itching and rash.  Neurological: Negative for seizures and loss of consciousness.  Endo/Heme/Allergies: Negative for polydipsia. Does not bruise/bleed easily.  Psychiatric/Behavioral: Negative for substance abuse. The patient is not nervous/anxious.      Physical Exam: Vital signs: Vitals:   02/18/20 0757 02/18/20 0823  BP: 117/72 122/85  Pulse: (!) 102 (!) 110  Resp: 16 16  Temp: 97.9 F (36.6 C) (!) 97.4 F (36.3 C)  SpO2: 99% 94%   Last BM Date: 02/15/20 Physical Exam Constitutional:      General: He is not in acute distress.    Appearance: He is obese.  HENT:     Head: Normocephalic and atraumatic.     Nose: Nose normal. No congestion.     Mouth/Throat:     Mouth: Mucous membranes are moist.     Pharynx: Oropharynx is clear.  Eyes:     General: No scleral icterus.    Extraocular Movements: Extraocular movements intact.     Comments: Conjunctival pallor  Cardiovascular:     Rate and Rhythm: Tachycardia present. Rhythm irregular.     Pulses: Normal pulses.  Pulmonary:     Effort: Pulmonary effort is normal. No respiratory distress.      Breath sounds: Normal breath sounds.  Abdominal:     General: Bowel sounds are normal. There is no distension.     Palpations: Abdomen is soft. There is no mass.     Tenderness: There is no abdominal tenderness. There is no guarding or rebound.     Hernia: No hernia is present.  Musculoskeletal:        General: No tenderness.     Cervical back: Normal range of motion and neck supple.     Right lower leg: Edema present.     Left lower leg: Edema present.  Skin:    General: Skin is warm and dry.  Neurological:     General: No focal deficit present.     Mental Status: He is alert and oriented to person, place, and time.  Psychiatric:        Mood and Affect: Mood normal.        Behavior: Behavior normal.      GI:  Lab Results: Recent Labs    02/16/20 1051 02/17/20 0438 02/18/20 0314  WBC 5.9 6.1 6.6  HGB 6.8* 7.8* 6.7*  HCT 21.0* 23.7* 19.9*  PLT 60* 76* 80*   BMET Recent Labs    02/16/20 0610 02/17/20 0438 02/18/20 0314  NA 136 139 138  K 3.7 3.7 3.4*  CL 107 110 110  CO2 18* 20* 17*  GLUCOSE 109* 114* 115*  BUN 83* 85* 95*  CREATININE 8.27* 7.63* 7.70*  CALCIUM 6.9* 7.1* 6.9*   LFT No results for input(s): PROT, ALBUMIN, AST, ALT, ALKPHOS, BILITOT, BILIDIR, IBILI in the last 72 hours. PT/INR Recent Labs    02/18/20 0746  LABPROT 17.6*  INR 1.5*     Studies/Results: No results found.  Impression: Suspected upper GI bleeding in the setting of Eliquis use: Anemia and melena -Hemoglobin 6.8 on 11/6, inadequate rise to 7.8 after 2u pRBCs, decreased today to 6.7 -Last dose of Eliquis ~8PM 11/7 -Elevated ferritin (1500) -INR 1.5  A. fib with RVR: On Eliquis  Metastatic bladder cancer on chemotherapy  Thrombocytopenia: Platelets 80K/uL  Acute on chronic kidney disease: BUN 95/creatinine 7.70, renal following  Plan: Patient currently stable, and since last dose of Eliquis was given at 8 PM last evening, we will plan for EGD tomorrow.  I thoroughly  discussed the procedure with the patient to include nature, alternatives, benefits, and risks (including but not limited to bleeding, infection, perforation, anesthesia/cardiac and pulmonary complications).  Patient verbalized understanding and gave verbal consent to proceed with EGD.  Clear liquid diet with n.p.o. after midnight.  Initiate Protonix drip.  Continue to monitor H&H with transfusion as needed to maintain hemoglobin greater than 7.  [facility] will follow.   LOS: 15 days   Salley Slaughter  PA-C 02/18/2020, 8:48 AM  Contact #  [phone]

## 2020-02-19 ENCOUNTER — Inpatient Hospital Stay (HOSPITAL_COMMUNITY): Payer: PPO | Admitting: Certified Registered Nurse Anesthetist

## 2020-02-19 ENCOUNTER — Encounter (HOSPITAL_COMMUNITY): Payer: Self-pay | Admitting: Internal Medicine

## 2020-02-19 ENCOUNTER — Encounter (HOSPITAL_COMMUNITY)
Admission: EM | Disposition: A | Discharge status: Home Health | Payer: Self-pay | Source: Home / Self Care | Attending: Internal Medicine

## 2020-02-19 DIAGNOSIS — K921 Melena: Secondary | ICD-10-CM | POA: Diagnosis present

## 2020-02-19 HISTORY — PX: GIVENS CAPSULE STUDY: SHX5432

## 2020-02-19 HISTORY — PX: ESOPHAGOGASTRODUODENOSCOPY: SHX5428

## 2020-02-19 LAB — HEMOGLOBIN AND HEMATOCRIT, BLOOD
HCT: 20.6 % — ABNORMAL LOW (ref 39.0–52.0)
HCT: 23.1 % — ABNORMAL LOW (ref 39.0–52.0)
Hemoglobin: 6.9 g/dL — CL (ref 13.0–17.0)
Hemoglobin: 7.5 g/dL — ABNORMAL LOW (ref 13.0–17.0)

## 2020-02-19 LAB — CBC WITH DIFFERENTIAL/PLATELET
Abs Immature Granulocytes: 0.05 10*3/uL (ref 0.00–0.07)
Basophils Absolute: 0 10*3/uL (ref 0.0–0.1)
Basophils Relative: 1 %
Eosinophils Absolute: 0.1 10*3/uL (ref 0.0–0.5)
Eosinophils Relative: 2 %
HCT: 20.6 % — ABNORMAL LOW (ref 39.0–52.0)
Hemoglobin: 6.7 g/dL — CL (ref 13.0–17.0)
Immature Granulocytes: 1 %
Lymphocytes Relative: 10 %
Lymphs Abs: 0.6 10*3/uL — ABNORMAL LOW (ref 0.7–4.0)
MCH: 31.9 pg (ref 26.0–34.0)
MCHC: 32.5 g/dL (ref 30.0–36.0)
MCV: 98.1 fL (ref 80.0–100.0)
Monocytes Absolute: 1 10*3/uL (ref 0.1–1.0)
Monocytes Relative: 16 %
Neutro Abs: 4.4 10*3/uL (ref 1.7–7.7)
Neutrophils Relative %: 70 %
Platelets: 94 10*3/uL — ABNORMAL LOW (ref 150–400)
RBC: 2.1 MIL/uL — ABNORMAL LOW (ref 4.22–5.81)
RDW: 16.8 % — ABNORMAL HIGH (ref 11.5–15.5)
WBC: 6.2 10*3/uL (ref 4.0–10.5)
nRBC: 0 % (ref 0.0–0.2)

## 2020-02-19 LAB — BASIC METABOLIC PANEL
Anion gap: 11 (ref 5–15)
BUN: 82 mg/dL — ABNORMAL HIGH (ref 8–23)
CO2: 18 mmol/L — ABNORMAL LOW (ref 22–32)
Calcium: 7.2 mg/dL — ABNORMAL LOW (ref 8.9–10.3)
Chloride: 108 mmol/L (ref 98–111)
Creatinine, Ser: 6.96 mg/dL — ABNORMAL HIGH (ref 0.61–1.24)
GFR, Estimated: 8 mL/min — ABNORMAL LOW (ref >60)
Glucose, Bld: 107 mg/dL — ABNORMAL HIGH (ref 70–99)
Potassium: 3.5 mmol/L (ref 3.5–5.1)
Sodium: 137 mmol/L (ref 135–145)

## 2020-02-19 LAB — PREPARE RBC (CROSSMATCH)

## 2020-02-19 LAB — MAGNESIUM: Magnesium: 1.4 mg/dL — ABNORMAL LOW (ref 1.7–2.4)

## 2020-02-19 SURGERY — EGD (ESOPHAGOGASTRODUODENOSCOPY)
Anesthesia: Monitor Anesthesia Care

## 2020-02-19 MED ORDER — PROPOFOL 500 MG/50ML IV EMUL
INTRAVENOUS | Status: DC | PRN
  Administered 2020-02-19: 100 ug/kg/min via INTRAVENOUS

## 2020-02-19 MED ORDER — LIDOCAINE 2% (20 MG/ML) 5 ML SYRINGE
INTRAMUSCULAR | Status: DC | PRN
  Administered 2020-02-19: 50 mg via INTRAVENOUS

## 2020-02-19 MED ORDER — PHENYLEPHRINE 40 MCG/ML (10ML) SYRINGE FOR IV PUSH (FOR BLOOD PRESSURE SUPPORT)
PREFILLED_SYRINGE | INTRAVENOUS | Status: DC | PRN
  Administered 2020-02-19: 160 ug via INTRAVENOUS

## 2020-02-19 MED ORDER — SODIUM CHLORIDE 0.9 % IV SOLN: INTRAVENOUS | Status: DC

## 2020-02-19 MED ORDER — PROPOFOL 10 MG/ML IV BOLUS
INTRAVENOUS | Status: DC | PRN
  Administered 2020-02-19 (×3): 20 mg via INTRAVENOUS

## 2020-02-19 MED ORDER — SODIUM CHLORIDE 0.9% IV SOLUTION: Freq: Once | INTRAVENOUS | Status: AC

## 2020-02-19 NOTE — Progress Notes (Signed)
Physical Therapy Treatment Patient Details Name: [name] Rose MRN: [id number] DOB: May 05, 1950 Today's Date: 02/19/2020    History of Present Illness 69 yo male admitted with FTT, pancytopenia, thrombocytopenia, sepsis, weakness. hx of met prostate ca with bladder mets, Afib, CKD    PT Comments    Pt remains weak but was able to amb short distance in room with assist. Fatigues quickly.    Follow Up Recommendations  Home health PT;Supervision/Assistance - 24 hour     Equipment Recommendations  None recommended by PT    Recommendations for Other Services       Precautions / Restrictions Precautions Precautions: Fall    Mobility  Bed Mobility Overal bed mobility: Needs Assistance Bed Mobility: Supine to Sit     Supine to sit: Mod assist;HOB elevated     General bed mobility comments: Assist to pull up to elevate trunk into sitting  Transfers Overall transfer level: Needs assistance Equipment used: Rolling walker (2 wheeled) Transfers: Sit to/from Stand Sit to Stand: Min assist;From elevated surface         General transfer comment: Assist to bring hips up. Pt with rapid semicontrolled descent to chair.   Ambulation/Gait Ambulation/Gait assistance: Min assist Gait Distance (Feet): 25 Feet Assistive device: Rolling walker (2 wheeled) Gait Pattern/deviations: Step-through pattern;Decreased stride length;Trunk flexed Gait velocity: decr Gait velocity interpretation: <1.31 ft/sec, indicative of household ambulator General Gait Details: Assist for balance and support   Marine scientist [place] (Stroke Patients Only)       Balance Overall balance assessment: Needs assistance Sitting-balance support: No upper extremity supported Sitting balance-Leahy Scale: Good     Standing balance support: Bilateral upper extremity supported Standing balance-Leahy Scale: Poor Standing balance comment: walker and min assist for  static standing                            Cognition Arousal/Alertness: Awake/alert Behavior During Therapy: WFL for tasks assessed/performed Overall Cognitive Status: Within Functional Limits for tasks assessed                                        Exercises      General Comments        Pertinent Vitals/Pain Pain Assessment: No/denies pain    Home Living                      Prior Function            PT Goals (current goals can now be found in the care plan section) Acute Rehab PT Goals Patient Stated Goal: get stronger and go home Progress towards PT goals: Not progressing toward goals - comment    Frequency    Min 3X/week      PT Plan Current plan remains appropriate    Co-evaluation              AM-PAC PT "6 Clicks" Mobility   Outcome Measure  Help needed turning from your back to your side while in a flat bed without using bedrails?: A Little Help needed moving from lying on your back to sitting on the side of a flat bed without using bedrails?: A Lot Help needed moving to and from a bed to a chair (including a  wheelchair)?: A Little Help needed standing up from a chair using your arms (e.g., wheelchair or bedside chair)?: A Little Help needed to walk in hospital room?: A Little Help needed climbing 3-5 steps with a railing? : Total 6 Click Score: 15    End of Session Equipment Utilized During Treatment: Gait belt Activity Tolerance: Patient limited by fatigue Patient left: in chair;with call bell/phone within reach Nurse Communication: Mobility status PT Visit Diagnosis: Muscle weakness (generalized) (M62.81);History of falling (Z91.81);Repeated falls (R29.6)     Time: 5051-8335 PT Time Calculation (min) (ACUTE ONLY): 22 min  Charges:  $Gait Training: 8-22 mins                     [place] Pager [phone] Office [place] 02/19/2020,  4:37 PM

## 2020-02-19 NOTE — Transfer of Care (Signed)
Immediate Anesthesia Transfer of Care Note  Patient: [name] Rose  Procedure(s) Performed: ESOPHAGOGASTRODUODENOSCOPY (EGD) (N/A )  Patient Location: Endoscopy Unit  Anesthesia Type:MAC  Level of Consciousness: awake, alert  and oriented  Airway & Oxygen Therapy: Patient Spontanous Breathing and Patient connected to nasal cannula oxygen  Post-op Assessment: Report given to RN, Post -op Vital signs reviewed and stable and Patient moving all extremities  Post vital signs: Reviewed and stable  Last Vitals:  Vitals Value Taken Time  BP 91/72 02/19/20 1120  Temp 35.7 C 02/19/20 1110  Pulse 87 02/19/20 1125  Resp 15 02/19/20 1125  SpO2 100 % 02/19/20 1125  Vitals shown include unvalidated device data.  Last Pain:  Vitals:   02/19/20 1120  TempSrc:   PainSc: 0-No pain      Patients Stated Pain Goal: 2 (15/52/08 0223)  Complications: No complications documented.

## 2020-02-19 NOTE — Anesthesia Preprocedure Evaluation (Signed)
Anesthesia Evaluation  Patient identified by MRN, date of birth, ID band Patient awake    Reviewed: Allergy & Precautions, [name] , NPO status , Patient's Chart, lab work & pertinent test results  Airway Mallampati: II   Neck ROM: full    Dental   Pulmonary shortness of breath, former smoker,    breath sounds clear to auscultation       Cardiovascular hypertension, + dysrhythmias Atrial Fibrillation  Rhythm:irregular Rate:Normal     Neuro/Psych    GI/Hepatic   Endo/Other    Renal/GU Renal InsufficiencyRenal disease     Musculoskeletal  (+) Arthritis ,   Abdominal   Peds  Hematology  (+) Blood dyscrasia, anemia ,   Anesthesia Other Findings   Reproductive/Obstetrics                             Anesthesia Physical Anesthesia Plan  ASA: III  Anesthesia Plan: MAC   Post-op Pain Management:    Induction: Intravenous  PONV Risk Score and Plan: 1 and Propofol infusion and Treatment may vary due to age or medical condition  Airway Management Planned: Nasal Cannula  Additional Equipment:   Intra-op Plan:   Post-operative Plan:   Informed Consent: I have reviewed the patients History and Physical, chart, labs and discussed the procedure including the risks, benefits and alternatives for the proposed anesthesia with the patient or authorized representative who has indicated his/her understanding and acceptance.       Plan Discussed with: CRNA, Anesthesiologist and Surgeon  Anesthesia Plan Comments:         Anesthesia Quick Evaluation

## 2020-02-19 NOTE — Progress Notes (Addendum)
Admit: 02/03/2020 LOS: 64  69 y.o. year-old Afib, on Eliquis, HTN, CKD 4 baseline creat is 1.9- 2.6 from 2021and bladder cancer w/ mets on chemoRx presented on 10/24 w/ gen'd weakness, diarrhea for 5 days, no appetite or fevers.  Consulted for AKI likely secondary to hypotension causing ATN.    Subjective:  . No acute events overnight. Received 2 uPRBCs yesterday for low Hbg.  Awaiting endoscopy today at 1pm.  Denies any chest pain, SOB, abdominal pain, decrease in urine output or hematuria.  11/08 0701 - 11/09 0700 In: 3152.3 [P.O.:590; I.V.:1932.3; Blood:630] Out: 3360 [FOYDX:4128]  Filed Weights   02/17/20 0337 02/18/20 0024 02/19/20 0015  Weight: 127 kg 123.8 kg 119.3 kg    Scheduled Meds: . sodium chloride   Intravenous Once  . sodium chloride   Intravenous Once  . Chlorhexidine Gluconate Cloth  6 each Topical Daily  . metoprolol tartrate  50 mg Oral BID  . oxybutynin  10 mg Oral QHS  . sodium bicarbonate  1,300 mg Oral BID  . sodium chloride flush  10-40 mL Intracatheter Q12H   Continuous Infusions: . sodium chloride 75 mL/hr at 02/19/20 0500  . sodium chloride Stopped (02/18/20 2000)  . diltiazem (CARDIZEM) infusion 5 mg/hr (02/19/20 0045)  . pantoprozole (PROTONIX) infusion 8 mg/hr (02/19/20 0547)   Current Labs: reviewed Hbg 6.9 Cr 6.96, BUN 82, CO2 18, Mag 1.4   Physical Exam:  Blood pressure 114/62, pulse 81, temperature 97.6 F (36.4 C), temperature source Oral, resp. rate 18, height 6' 2" (1.88 m), weight 119.3 kg, SpO2 100 %.   GEN: laying in bed, in no acute distress ENT: no nasal discharge, mmm EYES: no scleral icterus, eomi CV: normal rate, no murmurs PULM: no iwob, bilateral chest rise ABD: NABS, non-distended SKIN: no rashes or jaundice EXT: trace edema, warm and well perfused   A/P  Acute on Chronic Kidney Disease 4 Baseline creat is 1.9- 2.6 from 2021, eGFR 26- 34 ml/min. Creat 2.5 on admitand increase significantly likely due to ATN.    Creatinine improved overnight with hydration and s/p transfusion 1uPRBC.   -Continue gentle IV hydration until able to have oral po intake -Continue to monitor creatinine daily  -I do not think dialysis is warranted at this time.   Klebsiella PNA Neutropenic Sepsis Normotensive.  Continues to remain afebrile.  Competed course of antibiotics.  Neutropenia/Pancytopenia Per primary MD, ONC  AFIB Uncontrolled HR overnight. On Cardizem drip Eliquis on hold for Endoscopy Continue rate control medication per primary team  Metastatic Bladder Cancer Per primary MD  Thrombocytopenia Slight improvement   Hypokalemia Replete as needed Monitor daily  Hypomagnesimia Replete as needed Monitor daily  Anemia Likely multifactorial.  Has received multiple PRBC transfusions this admissions. -GI following, plan for endoscopy today  -On Protonix drip Avoid ESA given malignancy Continue transfusions per primary MD  Carollee Leitz, MD [facility] Medicine Res[facility]  02/19/2020, 6:42 AM  Recent Labs  Lab 02/17/20 0438 02/18/20 0314 02/19/20 0045  NA 139 138 137  K 3.7 3.4* 3.5  CL 110 110 108  CO2 20* 17* 18*  GLUCOSE 114* 115* 107*  BUN 85* 95* 82*  CREATININE 7.63* 7.70* 6.96*  CALCIUM 7.1* 6.9* 7.2*   Recent Labs  Lab 02/14/20 0906 02/14/20 0906 02/15/20 0131 02/15/20 0131 02/16/20 0610 02/16/20 0610 02/16/20 1051 02/16/20 1051 02/17/20 0438 02/18/20 0314 02/19/20 0230  WBC 6.7   < > 6.6   < > 5.8   < > 5.9  --  6.1 6.6  --   NEUTROABS 4.5  --  4.7  --  4.0  --   --   --   --   --   --   HGB 8.0*   < > 7.4*   < > 6.3*   < > 6.8*   < > 7.8* 6.7* 6.9*  HCT 24.0*   < > 22.7*   < > 19.6*   < > 21.0*   < > 23.7* 19.9* 20.6*  MCV 100.8*   < > 100.9*   < > 101.6*   < > 101.9*  --  96.3 96.1  --   PLT 44*   < > 53*   < > 58*   < > 60*  --  76* 80*  --    < > = values in this interval not displayed.    Plan communicated to the primary team

## 2020-02-19 NOTE — Progress Notes (Addendum)
[facility] PROGRESS NOTE    Progress Note  [name] Rose  LFY:[id number] DOB: [birth year] DOA: 02/03/2020 PCP: Shirline Frees, MD     Brief Narrative:   [name] Rose is an 69 y.o. male past medical history significant for metastatic bladder cancer currently on chemotherapy, paroxysmal atrial fibrillation, chronic kidney disease stage 3 comes into the ED for generalized weakness and diarrhea that started 4 days prior to admission.  On admission he was found to be septic with neutropenia with started on antibiotics oncology was consulted blood cultures grew Klebsiella.  Hospital course complicated by acute kidney injury and rectal and NG tube bleeding on 02/10/2020 he was transfused 1 unit of red blood cells, was started on IV Protonix, subsequently developed ileus which is slowly improving.  Assessment/Plan:   Acute renal failure with acute tubular necrosis superimposed on stage 3 chronic kidney disease ([place]): In the setting of sepsis and bladder cancer on chemo. Creatinine continues to improve with good urine output appreciate nephrology assistance.  Recurrent GI bleed/chemotherapy-induced anemia: Hemoglobin drop again will transfuse an additional unit of packed red blood cells, is to be at total of 4. Check a CBC posttransfusion continue Protonix IV drip. GI has been consulted currently n.p.o. for EGD this morning.  Vomiting and diarrhea after chemotherapy now resolved, with ileus: X-ray was improved on 02/12/2020. Resolved stop Metamucil and Lomotil.  Klebsiella bacteremia/severe sepsis: He completed his course of antibiotic in house.  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 ([place]) Appreciate oncology's assistance.  Acute thrombocytopenia: Related to chemotherapy in the setting of sepsis. Appreciate oncology's assistance, to receive 1 unit of platelets today.  Check a CBC posttransfusion on.  A. fib with RVR: With a chads Vascor greater than  2. Rate is currently controlled, Eliquis was held due to GI bleed and thrombocytopenia, Tikosyn is also being held due to acute kidney injury. His rate is controlled metoprolol and Cardizem.  Hypokalemia: Replete orally basic metabolic panel is pending this morning.  DVT prophylaxis: scd Family Communication:none Status is: Inpatient  Remains inpatient appropriate because:Hemodynamically unstable   Dispo: The patient is from: Home              Anticipated d/c is to: Home              Anticipated d/c date is: 2 days              Patient currently is not medically stable to d/c.  Code Status:     Code Status Orders  (From admission, onward)         Start     Ordered   02/03/20 1433  Full code  Continuous        02/03/20 1432        Code Status History    Date Active Date Inactive Code Status Order ID Comments User Context   09/25/2019 1405 09/28/2019 2249 Full Code [id number]  Sherran Needs, NP Inpatient   09/08/2018 1209 10/14/2018 1649 Full Code [id number]  Mariel Aloe, MD Inpatient   06/21/2018 1713 06/29/2018 1559 Full Code [id number]  Georgette Shell, MD ED   12/19/2017 1726 12/23/2017 1453 DNR [id number]  Alma Friendly, MD Inpatient   10/17/2016 1231 10/21/2016 1911 Full Code [id number]  Lavina Hamman, MD Inpatient   09/20/2016 1009 09/21/2016 1229 Full Code [id number]  Franchot Gallo, MD Inpatient   03/03/2015 1929 03/05/2015 1428 Full Code [id number]  Gary Fleet, PA-C Inpatient   Advance Care Planning Activity  Advance Directive Documentation     Most Recent Value  Type of Advance Directive Healthcare Power of Attorney  Pre-existing out of facility DNR order (yellow form or pink MOST form) --  "MOST" Form in Place? --        IV Access:    Peripheral IV   Procedures and diagnostic studies:   No results found.   Medical Consultants:    None.  Anti-Infectives:   kelfex  Subjective:    [name] Rose denies any abdominal pain  complain he is thirsty and hungry he wants to get this over with.  Objective:    Vitals:   02/18/20 2300 02/19/20 0015 02/19/20 0414 02/19/20 0745  BP: 130/74  114/62 123/77  Pulse: 98  81 87  Resp: $Remo'18  18 17  'YkyFM$ Temp: 98 F (36.7 C)  97.6 F (36.4 C) 97.8 F (36.6 C)  TempSrc: Oral  Oral Oral  SpO2: 97%  100% 95%  Weight:  119.3 kg    Height:       SpO2: 95 % O2 Flow Rate (L/min): 0 L/min   Intake/Output Summary (Last 24 hours) at 02/19/2020 0817 Last data filed at 02/19/2020 0549 Gross per 24 hour  Intake 2827.32 ml  Output 3360 ml  Net -532.68 ml   Filed Weights   02/17/20 0337 02/18/20 0024 02/19/20 0015  Weight: 127 kg 123.8 kg 119.3 kg    Exam: General exam: In no acute distress. Respiratory system: Good air movement and clear to auscultation. Cardiovascular system: S1 & S2 heard, RRR. No JVD. Gastrointestinal system: Abdomen is nondistended, soft and nontender.  Extremities: No pedal edema. Skin: No rashes, lesions or ulcers  Data Reviewed:    Labs: Basic Metabolic Panel: Recent Labs  Lab 02/15/20 0131 02/15/20 0131 02/16/20 0610 02/16/20 0610 02/17/20 0438 02/17/20 0438 02/18/20 0314 02/19/20 0045  NA 137  --  136  --  139  --  138 137  K 4.0   < > 3.7   < > 3.7   < > 3.4* 3.5  CL 109  --  107  --  110  --  110 108  CO2 17*  --  18*  --  20*  --  17* 18*  GLUCOSE 113*  --  109*  --  114*  --  115* 107*  BUN 88*  --  83*  --  85*  --  95* 82*  CREATININE 8.42*  --  8.27*  --  7.63*  --  7.70* 6.96*  CALCIUM 7.2*  --  6.9*  --  7.1*  --  6.9* 7.2*  MG  --   --   --   --   --   --  0.9* 1.4*   < > = values in this interval not displayed.   GFR Estimated Creatinine Clearance: 13.9 mL/min (A) (by C-G formula based on SCr of 6.96 mg/dL (H)). Liver Function Tests: No results for input(s): AST, ALT, ALKPHOS, BILITOT, PROT, ALBUMIN in the last 168 hours. No results for input(s): LIPASE, AMYLASE in the last 168 hours. No results for input(s): AMMONIA  in the last 168 hours. Coagulation profile Recent Labs  Lab 02/18/20 0746  INR 1.5*   COVID-19 Labs  Recent Labs    02/18/20 0746  FERRITIN 1,500*    Lab Results  Component Value Date   SARSCOV2NAA NEGATIVE 02/03/2020   SARSCOV2NAA NEGATIVE 09/22/2019   SARSCOV2NAA NEGATIVE 08/02/2019   [place] NEGATIVE 05/15/2019    CBC: Recent  Labs  Lab 02/13/20 0802 02/13/20 0802 02/14/20 0906 02/14/20 0906 02/15/20 0131 02/15/20 0131 02/16/20 0610 02/16/20 1051 02/17/20 0438 02/18/20 0314 02/19/20 0230  WBC 7.4   < > 6.7   < > 6.6  --  5.8 5.9 6.1 6.6  --   NEUTROABS 5.0  --  4.5  --  4.7  --  4.0  --   --   --   --   HGB 8.2*   < > 8.0*   < > 7.4*   < > 6.3* 6.8* 7.8* 6.7* 6.9*  HCT 24.9*   < > 24.0*   < > 22.7*   < > 19.6* 21.0* 23.7* 19.9* 20.6*  MCV 100.8*   < > 100.8*   < > 100.9*  --  101.6* 101.9* 96.3 96.1  --   PLT 41*   < > 44*   < > 53*  --  58* 60* 76* 80*  --    < > = values in this interval not displayed.   Cardiac Enzymes: No results for input(s): CKTOTAL, CKMB, CKMBINDEX, TROPONINI in the last 168 hours. BNP (last 3 results) No results for input(s): PROBNP in the last 8760 hours. CBG: No results for input(s): GLUCAP in the last 168 hours. D-Dimer: No results for input(s): DDIMER in the last 72 hours. Hgb A1c: No results for input(s): HGBA1C in the last 72 hours. Lipid Profile: No results for input(s): CHOL, HDL, LDLCALC, TRIG, CHOLHDL, LDLDIRECT in the last 72 hours. Thyroid function studies: No results for input(s): TSH, T4TOTAL, T3FREE, THYROIDAB in the last 72 hours.  Invalid input(s): FREET3 Anemia work up: Recent Labs    02/18/20 0746  FERRITIN 1,500*  TIBC 125*  IRON 57   Sepsis Labs: Recent Labs  Lab 02/16/20 0610 02/16/20 1051 02/17/20 0438 02/18/20 0314  WBC 5.8 5.9 6.1 6.6   Microbiology Recent Results (from the past 240 hour(s))  Culture, blood (Routine X 2) w Reflex to ID Panel     Status: None   Collection Time:  02/09/20 10:48 AM   Specimen: BLOOD  Result Value Ref Range Status   Specimen Description   Final    BLOOD LEFT ARM Performed at [facility] [address], [place], [place] [address]    Special Requests   Final    BOTTLES DRAWN AEROBIC AND ANAEROBIC Blood Culture adequate volume Performed at [facility] [address] [place], [place] [address]    Culture   Final    NO GROWTH 5 DAYS Performed at [facility], [place] [address], [place], [place] [address]    Report Status 02/14/2020 FINAL  Final  Culture, blood (Routine X 2) w Reflex to ID Panel     Status: None   Collection Time: 02/09/20 10:50 AM   Specimen: BLOOD  Result Value Ref Range Status   Specimen Description   Final    BLOOD LEFT ARM Performed at [facility] [address] [place], [place] [address]    Special Requests   Final    BOTTLES DRAWN AEROBIC AND ANAEROBIC Blood Culture adequate volume Performed at [facility] [address] [place], [place] [address]    Culture   Final    NO GROWTH 5 DAYS Performed at [facility], [place] [address], [place], [place] [address]    Report Status 02/14/2020 FINAL  Final     Medications:   . sodium chloride  Intravenous Once  . sodium chloride   Intravenous Once  . Chlorhexidine Gluconate Cloth  6 each Topical Daily  . metoprolol tartrate  50 mg Oral BID  . oxybutynin  10 mg Oral QHS  . sodium bicarbonate  1,300 mg Oral BID  . sodium chloride flush  10-40 mL Intracatheter Q12H   Continuous Infusions: . sodium chloride 75 mL/hr at 02/19/20 0500  . sodium chloride Stopped (02/18/20 2000)  . diltiazem (CARDIZEM) infusion 5 mg/hr (02/19/20 0045)  . pantoprozole (PROTONIX) infusion 8 mg/hr (02/19/20 0547)      LOS: 16 days   Charlynne Cousins  [facility]  02/19/2020, 8:17 AM

## 2020-02-19 NOTE — Progress Notes (Signed)
[place] capsule ingested at 1200 PM. Diet and capsule instructions given to patient and floor RN Beverlee Nims, RN. Sheet of instructions left with patient and floor RN. No further questions asked.

## 2020-02-19 NOTE — Progress Notes (Signed)
PT Cancellation Note  Patient Details Name: [name] Rose MRN: [id number] DOB: 03/01/1951   Cancelled Treatment:    Reason Eval/Treat Not Completed: Patient at procedure or test/unavailable. Pt down for endo. Will continue to follow.    [place] 02/19/2020, 10:59 AM  [place] Pager [phone] Office [phone]

## 2020-02-19 NOTE — Progress Notes (Signed)
Gave report to Mickel Baas, RN from to endoscopy. Patient's telebox is still on the patient due to cardizem gtt. Transport RN's are aware. CCMD is notified.

## 2020-02-19 NOTE — Anesthesia Procedure Notes (Signed)
Procedure Name: MAC Date/Time: 02/19/2020 10:52 AM Performed by: Leonor Liv, CRNA Pre-anesthesia Checklist: Patient identified, Emergency Drugs available, Suction available, Patient being monitored and Timeout performed Patient Re-evaluated:Patient Re-evaluated prior to induction Oxygen Delivery Method: Nasal cannula Airway Equipment and Method: Bite block Placement Confirmation: positive ETCO2 Dental Injury: Teeth and Oropharynx as per pre-operative assessment

## 2020-02-19 NOTE — Interval H&P Note (Signed)
History and Physical Interval Note:  02/19/2020 10:46 AM  [name] Rose  has presented today for surgery, with the diagnosis of melena, anemia.  The various methods of treatment have been discussed with the patient and family. After consideration of risks, benefits and other options for treatment, the patient has consented to  Procedure(s): ESOPHAGOGASTRODUODENOSCOPY (EGD) (N/A) as a surgical intervention.  The patient's history has been reviewed, patient examined, no change in status, stable for surgery.  I have reviewed the patient's chart and labs.  Questions were answered to the patient's satisfaction.     Lear Ng

## 2020-02-19 NOTE — Op Note (Addendum)
[facility] Patient Name: [name] Rose Procedure Date : 02/19/2020 MRN: [id number] Attending MD: Lear Ng , MD Date of Birth: [birth year] CSN: [id number] Age: 69 Admit Type: Inpatient Procedure:                Upper GI endoscopy Indications:              Melena Providers:                Wille Glaser RN, RN, Elspeth Cho Tech.,                            Technician, Lesia Sago, Technician, Lear Ng, MD Referring MD:             hospital team Medicines:                Propofol per Anesthesia, Monitored Anesthesia Care Complications:            No immediate complications. Estimated Blood Loss:     Estimated blood loss: none. Procedure:                Pre-Anesthesia Assessment:                           - Prior to the procedure, a History and Physical                            was performed, and patient medications and                            allergies were reviewed. The patient's tolerance of                            previous anesthesia was also reviewed. The risks                            and benefits of the procedure and the sedation                            options and risks were discussed with the patient.                            All questions were answered, and informed consent                            was obtained. Prior Anticoagulants: The patient has                            taken no previous anticoagulant or antiplatelet                            agents. ASA Grade Assessment: III - A patient with  severe systemic disease. After reviewing the risks                            and benefits, the patient was deemed in                            satisfactory condition to undergo the procedure.                           After obtaining informed consent, the endoscope was                            passed under direct vision. Throughout the                             procedure, the patient's blood pressure, pulse, and                            oxygen saturations were monitored continuously. The                            [id number]-H190 ([id number]) Olympus gastroscope was                            introduced through the mouth, and advanced to the                            second part of duodenum. The upper GI endoscopy was                            accomplished without difficulty. The patient                            tolerated the procedure well. Scope In: Scope Out: Findings:      The examined esophagus was normal.      The Z-line was regular and was found 40 cm from the incisors.      Segmental moderate inflammation characterized by congestion (edema),       erosions and erythema was found in the gastric body and in the gastric       antrum.      Moderate portal hypertensive gastropathy was found in the gastric body.      One non-bleeding cratered gastric ulcer with no stigmata of bleeding was       found in the gastric body. The lesion was 5 mm in largest dimension.      The examined duodenum was normal. Impression:               - Normal esophagus.                           - Z-line regular, 40 cm from the incisors.                           - Gastritis.                           -  Portal hypertensive gastropathy.                           - Non-bleeding gastric ulcer with no stigmata of                            bleeding.                           - Normal examined duodenum.                           - No specimens collected. Recommendation:           - NPO.                           - Observe patient's clinical course.                           - To visualize the small bowel, perform video                            capsule endoscopy. Procedure Code(s):        --- Professional ---                           [phone], Esophagogastroduodenoscopy, flexible,                            transoral; diagnostic, including collection of                             specimen(s) by brushing or washing, when performed                            (separate procedure) Diagnosis Code(s):        --- Professional ---                           K92.1, Melena (includes Hematochezia)                           K25.9, Gastric ulcer, unspecified as acute or                            chronic, without hemorrhage or perforation                           K29.70, Gastritis, unspecified, without bleeding                           K76.6, Portal hypertension                           K31.89, Other diseases of stomach and duodenum CPT copyright 2019 [employer] Association. All rights reserved. The codes documented in this report are preliminary and upon coder review may  be revised to meet current compliance requirements. Evette Doffing  Aloha Gell, MD 02/19/2020 11:25:07 AM This report has been signed electronically. Number of Addenda: 0

## 2020-02-19 NOTE — Brief Op Note (Signed)
Clean-based small gastric ulcer in body with surrounding erythema and mosaic pattern consistent with gastropathy. Distal gastritis. No active bleeding. Gastric findings could have caused the melena and anemia but will do a capsule endoscopy to evaluate for other potential sources prior to resuming anticoagulation. Diet recs per capsule instructions. See endopro note for complete recs/details. Dr. Alessandra Bevels to f/u tomorrow and read capsule.

## 2020-02-19 NOTE — Progress Notes (Signed)
[name] Rose is stable.  His hemoglobin is 6.9.  We gave him some platelets yesterday on the believe that there may been some platelet dysfunction from his uremia.  He is going for endoscopy today.  He is on a diltiazem drip for the atrial fibrillation.  He is off anticoagulation right now.  There is no formal CBC back yet.  His renal function seems to be slowly improving.  The BUN and creatinine are 82 and 7.  His INR is 1.5.  PTT is 30.  I will hold off on transfusing him blood right now.  He has had no fever.  He still has some pruritus.  Not sure why he has the pruritus.  He is not been able to eat because of the GI bleeding.  Hopefully he'll be able to have something after the endoscopy.  He has had no problems with pain.  He does have urine output.  I don't think he is noted any obvious melena.  His vital signs are all stable.  His blood pressure is 114/62.  Heart rate is 81.  Temperature 97.6.  There really is no change in his overall physical exam right now.  Again will have to await the results of the endoscopy.  We will see what his actual CBC shows.  I think that the CBC tomorrow will be helpful as to whether or not we have to transfuse him.  At least, his renal function is getting better which is 1 last thing to worry about at this point.  I appreciate the outstanding care he is getting from all the staff upon 3 E.  [name] Haw, MD  [name] Rose 3:20

## 2020-02-19 NOTE — Progress Notes (Signed)
Patient has arrived back from endoscopy. Patient is alert and oriented x 4 and expresses no pain, just hunger. Patient is educated that he is NPO until 2 then is placed on a clear liquid diet due to capsule that was given in endoscopy. Patient verbalizes understanding. Call bell is within reach and telemonitor has been reapplied. Wife is at the bedside.

## 2020-02-20 ENCOUNTER — Encounter (HOSPITAL_COMMUNITY): Payer: Self-pay | Admitting: Gastroenterology

## 2020-02-20 DIAGNOSIS — R531 Weakness: Secondary | ICD-10-CM

## 2020-02-20 DIAGNOSIS — N170 Acute kidney failure with tubular necrosis: Secondary | ICD-10-CM | POA: Diagnosis not present

## 2020-02-20 DIAGNOSIS — N183 Chronic kidney disease, stage 3 unspecified: Secondary | ICD-10-CM | POA: Diagnosis not present

## 2020-02-20 DIAGNOSIS — K921 Melena: Secondary | ICD-10-CM

## 2020-02-20 DIAGNOSIS — D500 Iron deficiency anemia secondary to blood loss (chronic): Secondary | ICD-10-CM

## 2020-02-20 LAB — PREPARE PLATELET PHERESIS
Unit division: 0
Unit division: 0

## 2020-02-20 LAB — BPAM RBC
Blood Product Expiration Date: 202111262359
Blood Product Expiration Date: 202111262359
Blood Product Expiration Date: 202111272359
Blood Product Expiration Date: 202111272359
ISSUE DATE / TIME: 202111061225
ISSUE DATE / TIME: 202111061559
ISSUE DATE / TIME: 202111080758
ISSUE DATE / TIME: 202111091650
Unit Type and Rh: 6200
Unit Type and Rh: 6200
Unit Type and Rh: 6200
Unit Type and Rh: 6200

## 2020-02-20 LAB — COMPREHENSIVE METABOLIC PANEL
ALT: 7 U/L (ref 0–44)
AST: 28 U/L (ref 15–41)
Albumin: 1.9 g/dL — ABNORMAL LOW (ref 3.5–5.0)
Alkaline Phosphatase: 54 U/L (ref 38–126)
Anion gap: 9 (ref 5–15)
BUN: 70 mg/dL — ABNORMAL HIGH (ref 8–23)
CO2: 19 mmol/L — ABNORMAL LOW (ref 22–32)
Calcium: 7 mg/dL — ABNORMAL LOW (ref 8.9–10.3)
Chloride: 110 mmol/L (ref 98–111)
Creatinine, Ser: 6.59 mg/dL — ABNORMAL HIGH (ref 0.61–1.24)
GFR, Estimated: 9 mL/min — ABNORMAL LOW (ref >60)
Glucose, Bld: 123 mg/dL — ABNORMAL HIGH (ref 70–99)
Potassium: 3.5 mmol/L (ref 3.5–5.1)
Sodium: 138 mmol/L (ref 135–145)
Total Bilirubin: 0.7 mg/dL (ref 0.3–1.2)
Total Protein: 4.5 g/dL — ABNORMAL LOW (ref 6.5–8.1)

## 2020-02-20 LAB — TYPE AND SCREEN
ABO/RH(D): A POS
Antibody Screen: NEGATIVE
Unit division: 0
Unit division: 0
Unit division: 0
Unit division: 0

## 2020-02-20 LAB — CBC WITH DIFFERENTIAL/PLATELET
Abs Immature Granulocytes: 0.04 10*3/uL (ref 0.00–0.07)
Basophils Absolute: 0 10*3/uL (ref 0.0–0.1)
Basophils Relative: 1 %
Eosinophils Absolute: 0.1 10*3/uL (ref 0.0–0.5)
Eosinophils Relative: 2 %
HCT: 21.8 % — ABNORMAL LOW (ref 39.0–52.0)
Hemoglobin: 7.2 g/dL — ABNORMAL LOW (ref 13.0–17.0)
Immature Granulocytes: 1 %
Lymphocytes Relative: 9 %
Lymphs Abs: 0.6 10*3/uL — ABNORMAL LOW (ref 0.7–4.0)
MCH: 32.3 pg (ref 26.0–34.0)
MCHC: 33 g/dL (ref 30.0–36.0)
MCV: 97.8 fL (ref 80.0–100.0)
Monocytes Absolute: 1.1 10*3/uL — ABNORMAL HIGH (ref 0.1–1.0)
Monocytes Relative: 18 %
Neutro Abs: 4.2 10*3/uL (ref 1.7–7.7)
Neutrophils Relative %: 69 %
Platelets: 95 10*3/uL — ABNORMAL LOW (ref 150–400)
RBC: 2.23 MIL/uL — ABNORMAL LOW (ref 4.22–5.81)
RDW: 16.7 % — ABNORMAL HIGH (ref 11.5–15.5)
WBC: 6 10*3/uL (ref 4.0–10.5)
nRBC: 0 % (ref 0.0–0.2)

## 2020-02-20 LAB — BPAM PLATELET PHERESIS
Blood Product Expiration Date: 202111082359
Blood Product Expiration Date: 202111092359
ISSUE DATE / TIME: 202111081835
ISSUE DATE / TIME: 202111090845
Unit Type and Rh: 6200
Unit Type and Rh: 6200

## 2020-02-20 MED ORDER — PANTOPRAZOLE SODIUM 40 MG IV SOLR
40.0000 mg | Freq: Two times a day (BID) | INTRAVENOUS | Status: DC
  Administered 2020-02-20 – 2020-02-21 (×3): 40 mg via INTRAVENOUS
  Filled 2020-02-20 (×3): qty 40

## 2020-02-20 MED ORDER — DILTIAZEM HCL ER COATED BEADS 180 MG PO CP24
180.0000 mg | ORAL_CAPSULE | Freq: Every day | ORAL | Status: DC
  Administered 2020-02-20 – 2020-02-25 (×6): 180 mg via ORAL
  Filled 2020-02-20 (×6): qty 1

## 2020-02-20 NOTE — Progress Notes (Signed)
[facility]  PROGRESS NOTE  [name] KVQ:[id number] DOB: Apr 17, 1950 DOA: 02/03/2020 PCP: Shirline Frees, MD Admit date - 02/03/2020   Admitting Physician Harold Hedge, MD  Outpatient Primary MD for the patient is Shirline Frees, MD  LOS - 24 Brief Narrative  [name] a 69 year old male with medical history significant for metastatic bladder cancer on chemotherapy, PAF, CKD stage III who presented to the ED on 10/24 with complaints of generalized weakness and diarrhea for the past 4 to 5 days and was made with working diagnosis of failure to thrive in adult in the setting of recent chemotherapy complicated by diarrhea and severe pancytopenia (WBC of 0.2) adn BP 99/70.  During hospitalization antibiotics were not initially started given patient was afebrile but on night of admission patient became febrile and was started on cefepime for management of sepsis secondary to neutropenia blood cultures are positive for Klebsiella pneumonia.  Hospital course further complicated by AKI on CKD in setting of hypotension likely related to sepsis as well as acute blood loss anemia of unclear etiology.  EGD was unremarkable patient currently awaiting capsule endoscopy results.  Subjective  Today complains of itching of rash on back.  Denies any shortness of breath.  Has not had a bowel movement yet.  Otherwise no acute complaints  A & P   Sepsis secondary to Klebsiella pneumonia in the setting of neutropenia, resolved.  Patient completed course of antibiotics.  Sepsis physiology resolved.  Nonoliguric AKI on CKD stage IV improving.  Believed to be ATN related to hypotension from sepsis physiology as well as low BP related to acute blood loss anemia suspected to be GI bleed.  Patient is still making adequate urine, creatinine is actually, no edema, Slight abnormalities.  Has mild normal anion gap metabolic acidosis -Continue to avoid nephrotoxins -Continue to monitor renal  function/urine output, daily BMP -Appreciate nephrology recommendations, continue sodium bicarb  Acute blood loss anemia.  Initially suspected upper GI given reported melena.  EGD on 11/9 showed portal hypertensive gastropathy and gastric ulcer.  Currently his hemoglobin remained stable today -Capsule endoscopy images pending, GI will follow up -Appreciate GI recommendations, continue p.o. PPI -Continue to monitor daily CBC (hemoglobin) -Continue to hold home Eliquis  Metastatic bladder cancer on chemotherapy with pancytopenia.  White count now normal limits.  Hemoglobin stable to slightly worsened in the setting of GI bleed).  Foley cath continue to improve currently 95. Continue monitor CBC-appreciate oncology recommendations  Atrial fibrillation, currently rate controlled.  Had RVR only on metoprolol during hospital stay, he also on oral diltiazem -holding home Eliquis given acute blood loss anemia suspected to be GI bleed as mentioned above -Holding home Tikosyn given AKI -currently rate controlled on metoprolol and IV diltiazem drip, will transition to oral diltiazem, closely monitor on telemetry   Family Communication  : Wife updated at bedside  Code Status : Full  Disposition Plan  :  Patient is from home. Anticipated d/c date: 2 to 3 days. Barriers to d/c or necessity for inpatient status:  Consults  : Nephrology, oncology, GI  Procedures  : EGD, 11/9, capsule endoscopy  DVT Prophylaxis  : SCDs  MDM: The below labs and imaging reports were reviewed and summarized above.  Medication management as above.  Lab Results  Component Value Date   PLT 95 (L) 02/20/2020    Diet :  Diet Order            Diet Heart Room service appropriate? Yes; Fluid  consistency: Thin  Diet effective now                  Inpatient Medications Scheduled Meds:  sodium chloride   Intravenous Once   sodium chloride   Intravenous Once   Chlorhexidine Gluconate Cloth  6 each Topical Daily    metoprolol tartrate  50 mg Oral BID   oxybutynin  10 mg Oral QHS   pantoprazole (PROTONIX) IV  40 mg Intravenous Q12H   sodium bicarbonate  1,300 mg Oral BID   sodium chloride flush  10-40 mL Intracatheter Q12H   Continuous Infusions:  sodium chloride Stopped (02/19/20 1200)   diltiazem (CARDIZEM) infusion 5 mg/hr (02/20/20 0211)   PRN Meds:.acetaminophen **OR** [DISCONTINUED] acetaminophen, diphenhydrAMINE, Gerhardt's butt cream, hydrOXYzine, melatonin, ondansetron **OR** ondansetron (ZOFRAN) IV, sodium chloride flush  Antibiotics  :   Anti-infectives (From admission, onward)   Start     Dose/Rate Route Frequency Ordered Stop   02/15/20 2000  cephALEXin (KEFLEX) capsule 500 mg        500 mg Oral Every 12 hours 02/15/20 0853 02/16/20 2152   02/13/20 0815  cephALEXin (KEFLEX) capsule 500 mg  Status:  Discontinued        500 mg Oral Every 12 hours 02/13/20 0754 02/15/20 0853   02/12/20 0900  ceFAZolin (ANCEF) IVPB 1 g/50 mL premix  Status:  Discontinued        1 g 100 mL/hr over 30 Minutes Intravenous Every 24 hours 02/11/20 1035 02/13/20 0754   02/06/20 1200  ceFAZolin (ANCEF) IVPB 2g/100 mL premix  Status:  Discontinued        2 g 200 mL/hr over 30 Minutes Intravenous Every 12 hours 02/06/20 1103 02/11/20 1035   02/05/20 0600  ceFEPIme (MAXIPIME) 2 g in sodium chloride 0.9 % 100 mL IVPB  Status:  Discontinued        2 g 200 mL/hr over 30 Minutes Intravenous Every 24 hours 02/04/20 0719 02/04/20 1150   02/04/20 1400  cefTRIAXone (ROCEPHIN) 2 g in sodium chloride 0.9 % 100 mL IVPB  Status:  Discontinued        2 g 200 mL/hr over 30 Minutes Intravenous Every 24 hours 02/04/20 1150 02/06/20 1103   02/03/20 1830  ceFEPIme (MAXIPIME) 2 g in sodium chloride 0.9 % 100 mL IVPB  Status:  Discontinued        2 g 200 mL/hr over 30 Minutes Intravenous Every 12 hours 02/03/20 1825 02/04/20 0719       Objective   Vitals:   02/19/20 2344 02/20/20 0409 02/20/20 1014 02/20/20 1144    BP: 117/73 128/74 105/67 114/73  Pulse: 85 80 63 72  Resp: $Remo'18 18 18 20  'fyoqF$ Temp: 98.1 F (36.7 C) (!) 97.5 F (36.4 C) (!) 97.4 F (36.3 C) 97.6 F (36.4 C)  TempSrc: Oral Oral Oral Oral  SpO2: 96% 100% 100% 100%  Weight:  122.5 kg    Height:        SpO2: 100 % O2 Flow Rate (L/min): 0 L/min  Wt Readings from Last 3 Encounters:  02/20/20 122.5 kg  01/28/20 122.7 kg  01/21/20 122.9 kg     Intake/Output Summary (Last 24 hours) at 02/20/2020 1632 Last data filed at 02/20/2020 1601 Gross per 24 hour  Intake 3095.4 ml  Output 3125 ml  Net -29.6 ml    Physical Exam:     Awake Alert, Oriented X 3, Normal affect No new F.N deficits,  Ruby.AT, Normal respiratory effort on room  air, CTAB RRR,No Gallops,Rubs or new Murmurs,  +ve B.Sounds, Abd Soft, No tenderness, No rebound, guarding or rigidity. No Cyanosis,   Back: Scattered maculopapular erythematous, pruritic rash     I have personally reviewed the following:   Data Reviewed:  CBC Recent Labs  Lab 02/14/20 0906 02/14/20 0906 02/15/20 0131 02/15/20 0131 02/16/20 0610 02/16/20 0610 02/16/20 1051 02/16/20 1051 02/17/20 0438 02/17/20 0438 02/18/20 0314 02/19/20 0230 02/19/20 1518 02/19/20 2214 02/20/20 0620  WBC 6.7   < > 6.6   < > 5.8   < > 5.9  --  6.1  --  6.6  --  6.2  --  6.0  HGB 8.0*   < > 7.4*   < > 6.3*   < > 6.8*   < > 7.8*   < > 6.7* 6.9* 6.7* 7.5* 7.2*  HCT 24.0*   < > 22.7*   < > 19.6*   < > 21.0*   < > 23.7*   < > 19.9* 20.6* 20.6* 23.1* 21.8*  PLT 44*   < > 53*   < > 58*   < > 60*  --  76*  --  80*  --  94*  --  95*  MCV 100.8*   < > 100.9*   < > 101.6*   < > 101.9*  --  96.3  --  96.1  --  98.1  --  97.8  MCH 33.6   < > 32.9   < > 32.6   < > 33.0  --  31.7  --  32.4  --  31.9  --  32.3  MCHC 33.3   < > 32.6   < > 32.1   < > 32.4  --  32.9  --  33.7  --  32.5  --  33.0  RDW 15.5   < > 15.3   < > 15.4   < > 15.3  --  17.2*  --  16.8*  --  16.8*  --  16.7*  LYMPHSABS 0.6*  --  0.5*  --   0.6*  --   --   --   --   --   --   --  0.6*  --  0.6*  MONOABS 1.0  --  0.9  --  0.8  --   --   --   --   --   --   --  1.0  --  1.1*  EOSABS 0.3  --  0.3  --  0.2  --   --   --   --   --   --   --  0.1  --  0.1  BASOSABS 0.0  --  0.0  --  0.0  --   --   --   --   --   --   --  0.0  --  0.0   < > = values in this interval not displayed.    Chemistries  Recent Labs  Lab 02/16/20 0610 02/17/20 0438 02/18/20 0314 02/19/20 0045 02/20/20 0620  NA 136 139 138 137 138  K 3.7 3.7 3.4* 3.5 3.5  CL 107 110 110 108 110  CO2 18* 20* 17* 18* 19*  GLUCOSE 109* 114* 115* 107* 123*  BUN 83* 85* 95* 82* 70*  CREATININE 8.27* 7.63* 7.70* 6.96* 6.59*  CALCIUM 6.9* 7.1* 6.9* 7.2* 7.0*  MG  --   --  0.9* 1.4*  --   AST  --   --   --   --  28  ALT  --   --   --   --  7  ALKPHOS  --   --   --   --  54  BILITOT  --   --   --   --  0.7   ------------------------------------------------------------------------------------------------------------------ No results for input(s): CHOL, HDL, LDLCALC, TRIG, CHOLHDL, LDLDIRECT in the last 72 hours.  Lab Results  Component Value Date   HGBA1C 4.9 10/05/2018   ------------------------------------------------------------------------------------------------------------------ No results for input(s): TSH, T4TOTAL, T3FREE, THYROIDAB in the last 72 hours.  Invalid input(s): FREET3 ------------------------------------------------------------------------------------------------------------------ Recent Labs    02/18/20 0746  FERRITIN 1,500*  TIBC 125*  IRON 57    Coagulation profile Recent Labs  Lab 02/18/20 0746  INR 1.5*    No results for input(s): DDIMER in the last 72 hours.  Cardiac Enzymes No results for input(s): CKMB, TROPONINI, MYOGLOBIN in the last 168 hours.  Invalid input(s): CK ------------------------------------------------------------------------------------------------------------------ No results found for: BNP  Micro  Results No results found for this or any previous visit (from the past 240 hour(s)).  Radiology Reports CT ABDOMEN PELVIS WO CONTRAST  Result Date: 02/07/2020 CLINICAL DATA:  Generalized weakness, diarrhea, decreased appetite, hypotensive, leukopenia, thrombocytopenia, history metastatic bladder cancer, paroxysmal atrial fibrillation, hypertension, stage IV chronic kidney disease, question bowel obstruction EXAM: CT ABDOMEN AND PELVIS WITHOUT CONTRAST TECHNIQUE: Multidetector CT imaging of the abdomen and pelvis was performed following the standard protocol without IV contrast. Sagittal and coronal MPR images reconstructed from axial data set. Water as oral contrast. COMPARISON:  01/05/2018 FINDINGS: Lower chest: RIGHT lower lobe bronchiectasis. Bibasilar atelectasis. Small LEFT pleural effusion. Hepatobiliary: Low-attenuation foci within liver question cysts unchanged, largest 15 x 14 mm lateral segment LEFT lobe image 19. Gallbladder unremarkable. Pancreas: Atrophic pancreas without mass Spleen: Normal appearance Adrenals/Urinary Tract: Adrenal glands, kidneys, and ureters normal appearance. Bladder wall appears thickened but bladder is underdistended, question artifact. Stomach/Bowel: Minimal sigmoid diverticulosis without evidence of diverticulitis. Diffuse dilatation of small bowel loops throughout abdomen. Stomach distended by fluid. Fluid within distended thus distal esophagus question reflux. Small bowel loops are but question of bowel wall thickening of the distal ileum is raised. Mild gaseous distension of ascending and transverse colon. Descending and sigmoid colon decompressed without discrete transition zone. Vascular/Lymphatic: Atherosclerotic calcifications aorta and iliac arteries without aneurysm. Coronary arterial calcifications. No adenopathy. Reproductive: Prior prostatectomy. Other: No free air. Small amount of fluid at RIGHT gutter. Minimal stranding of mesentery. Musculoskeletal:  Degenerative disc disease changes thoracolumbar spine. IMPRESSION: Diffuse dilatation of small bowel loops throughout abdomen to distal ileum question obstruction. Suspected bowel wall thickening of distal ileum, uncertain etiology but could represent inflammatory bowel disease or infection, tumor considered less likely due to length of involvement; suspect this is responsible for the observed component of small-bowel obstruction. Small amount of nonspecific free fluid. Minimal sigmoid diverticulosis without evidence of diverticulitis. RIGHT lower lobe bronchiectasis with bibasilar atelectasis and small LEFT pleural effusion. Aortic Atherosclerosis (ICD10-I70.0). Electronically Signed   By: Lavonia Dana M.D.   On: 02/07/2020 13:59   DG Abd 1 View  Result Date: 02/10/2020 CLINICAL DATA:  Small bowel obstruction. EXAM: ABDOMEN - 1 VIEW COMPARISON:  February 09, 2020 FINDINGS: Interval placement of enteric catheter, with side hole at the expected location of the GE junction. Persistent dilation of small bowel loops with mild wall thickening in the mid abdomen, with maximum diameter of 5 cm. IMPRESSION: 1. Interval placement of enteric catheter, with side hole at the expected location of the GE junction.  Advancement is recommended. 2. Persistent small bowel obstruction. Electronically Signed   By: Fidela Salisbury M.D.   On: 02/10/2020 17:03   DG Abd 1 View  Result Date: 02/09/2020 CLINICAL DATA:  Ileus. EXAM: ABDOMEN - 1 VIEW COMPARISON:  February 08, 2020 FINDINGS: Persistent dilation of small bowel loops throughout the abdomen measuring up to 5 cm in diameter, with associated mild small bowel wall thickening. The colon is decompressed with residual foci of gas. IMPRESSION: Persistent small bowel dilation throughout the abdomen, with associated mild small bowel wall thickening, suggestive of ongoing small bowel obstruction versus ileus. Supine positioning precludes proper evaluation for free intra-abdominal  gas. Electronically Signed   By: Fidela Salisbury M.D.   On: 02/09/2020 13:23   DG Abd 1 View  Result Date: 02/08/2020 CLINICAL DATA:  Follow-up ileus. EXAM: ABDOMEN - 1 VIEW COMPARISON:  Abdomen 02/07/2020.  CT 02/07/2020. FINDINGS: NG tube noted with tip over stomach. Persistent prominently dilated loops of small bowel noted. Colonic dilatation also noted. No free air identified. Degenerative change in scoliosis lumbar spine. Degenerative changes right hip. Total left hip replacement. IMPRESSION: 1.  NG tube noted with tip over stomach. 2. Persistent prominently dilated loops of small bowel again noted. Colonic dilatation also noted. Similar findings noted on prior exams. Findings consistent with persistent ileus. Continued follow-up exam suggested to demonstrate resolution and to exclude bowel obstruction. Electronically Signed   By: Marcello Moores  Register   On: 02/08/2020 07:35   US RENAL  Result Date: 02/05/2020 CLINICAL DATA:  Acute kidney injury EXAM: RENAL / URINARY TRACT ULTRASOUND COMPLETE COMPARISON:  09/21/2019 FINDINGS: Right Kidney: Renal measurements: 11.3 x 5.5 x 5.7 cm = volume: 185 mL. Echogenicity within normal limits. No mass or hydronephrosis visualized. Left Kidney: Renal measurements: 13.1 x 6 x 5.2 cm = volume: 212 mL. Echogenicity within normal limits. No mass or hydronephrosis visualized. Bladder: Appears normal for degree of bladder distention. Other: Incidental note made of echogenic hepatic parenchyma. IMPRESSION: 1. Normal ultrasound appearance of the kidneys 2. Liver appears slightly echogenic suggesting steatosis Electronically Signed   By: Donavan Foil M.D.   On: 02/05/2020 18:46   DG Abd Portable 1V  Result Date: 02/12/2020 CLINICAL DATA:  Ileus. EXAM: PORTABLE ABDOMEN - 1 VIEW COMPARISON:  02/10/2020 FINDINGS: Gaseous small bowel distention seen on the previous exam has decreased in the interval. A few scattered small bowel loops remain minimally distended up to about  3.4 cm. Gas is visible in the right and left colon. Degenerative changes noted lumbar spine. IMPRESSION: Interval decrease in gaseous bowel distention. Electronically Signed   By: Misty Stanley M.D.   On: 02/12/2020 08:05   DG Abd Portable 1V  Result Date: 02/07/2020 CLINICAL DATA:  69 year old male encounter for NG placement. EXAM: PORTABLE ABDOMEN - 1 VIEW COMPARISON:  Earlier radiograph dated 02/07/2020. FINDINGS: Interval advancement of the enteric tube with side-port and tip in the proximal stomach. Partially visualized central venous line with tip over the cavoatrial junction. IMPRESSION: Enteric tube with tip in the proximal stomach. Electronically Signed   By: Anner Crete M.D.   On: 02/07/2020 17:43   DG Abd Portable 1V  Result Date: 02/07/2020 CLINICAL DATA:  NG tube placement. EXAM: PORTABLE ABDOMEN - 1 VIEW COMPARISON:  CT 02/07/2020. FINDINGS: PowerPort catheter noted tip over cavoatrial junction. NG tube noted with tip at the gastroesophageal junction. Consider advancement of approximately 15 cm. Dilated loops of small bowel again noted. No free air identified. IMPRESSION: 1. NG tube with  tip at the gastroesophageal junction. Consider advancement of approximately 15 cm. 2. Dilated loops of small bowel again noted. Electronically Signed   By: Marcello Moores  Register   On: 02/07/2020 16:22   DG Abd Portable 1V  Result Date: 02/07/2020 CLINICAL DATA:  Abdominal distension. EXAM: PORTABLE ABDOMEN - 1 VIEW COMPARISON:  12/20/2019 nuclear medicine PET. FINDINGS: Dilated small and large bowel loops. No pneumoperitoneum. No air-fluid levels. No suspicious calcific densities. Multilevel spondylosis. Sequela of left hip arthroplasty. IMPRESSION: Dilated small and large bowel loops, concerning for ileus versus obstruction. These results will be called to the ordering clinician or representative by the Radiologist Assistant, and communication documented in the PACS or [employer]. Electronically  Signed   By: Primitivo Gauze M.D.   On: 02/07/2020 08:10     Time Spent in minutes  30     Desiree Hane M.D on 02/20/2020 at 4:32 PM  To page go to [email or web address] - password [facility]

## 2020-02-20 NOTE — Anesthesia Postprocedure Evaluation (Signed)
Anesthesia Post Note  Patient: [name] Rose  Procedure(s) Performed: ESOPHAGOGASTRODUODENOSCOPY (EGD) (N/A ) GIVENS CAPSULE STUDY (N/A )     Patient location during evaluation: Endoscopy Anesthesia Type: MAC Level of consciousness: awake and alert Pain management: pain level controlled Vital Signs Assessment: post-procedure vital signs reviewed and stable Respiratory status: spontaneous breathing, nonlabored ventilation, respiratory function stable and patient connected to nasal cannula oxygen Cardiovascular status: blood pressure returned to baseline and stable Postop Assessment: no apparent nausea or vomiting Anesthetic complications: no   No complications documented.  Last Vitals:  Vitals:   02/20/20 1144 02/20/20 1711  BP: 114/73 137/74  Pulse: 72 82  Resp: 20   Temp: 36.4 C   SpO2: 100% 100%    Last Pain:  Vitals:   02/20/20 1144  TempSrc: Oral  PainSc:                  [name]

## 2020-02-20 NOTE — Progress Notes (Signed)
Pt remains A/Ox4. VSS. MD ordered to stop IV Cardizem and transition over to PO. HR remains WNL. No complaints from pt. Safety maintained. Bed lowered and locked, call light in reach and educated on importance on calling for assistance. Will continue to monitor.

## 2020-02-20 NOTE — Progress Notes (Signed)
Admit: 02/03/2020 LOS: 56  69 y.o. year-old Afib, on Eliquis, HTN, CKD 4 baseline creat is 1.9- 2.6 from 2021and bladder cancer w/ mets on chemoRx presented on 10/24 w/ gen'd weakness, diarrhea for 5 days, no appetite or fevers.  Consulted for AKI likely secondary to hypotension causing ATN.    Subjective:  . No acute events overnight.  No concerns voiced today. Denies any chest pain, SOB, abdominal pain or nausea. Voiding well and no hematuria or bloody stool  11/09 0701 - 11/10 0700 In: 2569.4 [P.O.:620; I.V.:873.4; Blood:1076] Out: 1776 [BJYNW:2956; Stool:1]  Filed Weights   02/19/20 0015 02/19/20 1023 02/20/20 0409  Weight: 119.3 kg 119.3 kg 122.5 kg    Scheduled Meds: . sodium chloride   Intravenous Once  . sodium chloride   Intravenous Once  . Chlorhexidine Gluconate Cloth  6 each Topical Daily  . metoprolol tartrate  50 mg Oral BID  . oxybutynin  10 mg Oral QHS  . sodium bicarbonate  1,300 mg Oral BID  . sodium chloride flush  10-40 mL Intracatheter Q12H   Continuous Infusions: . sodium chloride 75 mL/hr at 02/20/20 0222  . sodium chloride Stopped (02/19/20 1200)  . diltiazem (CARDIZEM) infusion 5 mg/hr (02/20/20 0211)  . pantoprozole (PROTONIX) infusion 8 mg/hr (02/20/20 0222)   Current Labs: reviewed Hbg 7.2 Cr 6.59, BUN 70, CO2 19, Alb 1.9   Physical Exam:  Blood pressure 128/74, pulse 80, temperature (!) 97.5 F (36.4 C), temperature source Oral, resp. rate 18, height _0  (1.88 m), weight 122.5 kg, SpO2 100 %.   GEN: laying in bed, in no acute distress ENT: no nasal discharge, mmm EYES: no scleral icterus, eomi CV: normal rate, no murmurs PULM: no iwob, bilateral chest rise ABD: NABS, non-distended SKIN: no rashes or jaundice EXT: no edema, warm and well perfused   A/P  Acute on Chronic Kidney Disease 4 Baseline creat is 1.9- 2.6 from 2021, eGFR 26- 34 ml/min. Creat 2.5 on admitand increase significantly likely due to ATN.   Creatinine improving with  IV hydration -NPO for video capsule endoscopy   -Continue gentle IV hydration until able to have oral po intake -Continue to monitor creatinine daily  -I do not think dialysis is warranted at this time.   AFIB Uncontrolled HR overnight. On Cardizem drip Eliquis on hold for Endoscopy Continue rate control medication per primary team  Metastatic Bladder Cancer Per primary MD  Thrombocytopenia Improving Monitor daily  Hypoalbuminemia Would give Albumin 25 % once and monitor labs in am.  Anemia Likely multifactorial.  Has received multiple PRBC transfusions this admissions. -GI following, recommends video capsule endoscopy -EGD yesterday shows gastritis, porta hypertensive gastropathy, non-bleeding gastric ulcer with no bleeding and normal duodenum. -On Protonix drip Avoid ESA given malignancy Continue transfusions per primary MD  Carollee Leitz, MD [facility]  02/20/2020, 6:46 AM  Recent Labs  Lab 02/17/20 0438 02/18/20 0314 02/19/20 0045  NA 139 138 137  K 3.7 3.4* 3.5  CL 110 110 108  CO2 20* 17* 18*  GLUCOSE 114* 115* 107*  BUN 85* 95* 82*  CREATININE 7.63* 7.70* 6.96*  CALCIUM 7.1* 6.9* 7.2*   Recent Labs  Lab 02/15/20 0131 02/15/20 0131 02/16/20 0610 02/16/20 1051 02/17/20 0438 02/17/20 0438 02/18/20 0314 02/18/20 0314 02/19/20 0230 02/19/20 1518 02/19/20 2214  WBC 6.6   < > 5.8   < > 6.1  --  6.6  --   --  6.2  --   NEUTROABS 4.7  --  4.0  --   --   --   --   --   --  4.4  --   HGB 7.4*   < > 6.3*   < > 7.8*   < > 6.7*   < > 6.9* 6.7* 7.5*  HCT 22.7*   < > 19.6*   < > 23.7*   < > 19.9*   < > 20.6* 20.6* 23.1*  MCV 100.9*   < > 101.6*   < > 96.3  --  96.1  --   --  98.1  --   PLT 53*   < > 58*   < > 76*  --  80*  --   --  94*  --    < > = values in this interval not displayed.    Plan communicated to the primary team

## 2020-02-20 NOTE — Progress Notes (Signed)
[name] Rose is doing okay this morning.  He had the upper endoscopy yesterday.  As always, Dr. Michail Sermon did a wonderful job.  Looks either is a gastric ulcer.  I suspect this probably was where the NG tube was.  A capsule endoscopy is going to be done also just to make sure there is nothing else going on.  He is still off the Eliquis.  He is on a diltiazem drip for control of the atrial fibrillation.  His labs today shows platelet count to be coming up nicely.  Platelet count is 95,000.  His hemoglobin is 7.2.  He does not need any transfusion from my point of view.  I do not see his renal function studies back yet.  Hopefully he will be able to deliver more activity.  I know physical therapy is working with him.  He has had no fever.  His vital signs all are pretty stable.  Hopefully, his blood counts will continue to improve.  He is on the Protonix drip.  On his physical exam, there really is no change compared to yesterday.  Again, his blood pressure is 128/74.  Pulse is 80.  His temperature is 97.5.  We will continue to follow along.  Again, his bone marrow is recovering from the chemotherapy.  He had the Klebsiella bacteremia from being neutropenic.  I think this is resolved now.  Hopefully he can still go home later on this week.  Very impressed with the wonderful care that he is getting from all the staff on 3 E.   [name], MD  [place] 2:13

## 2020-02-20 NOTE — Progress Notes (Signed)
[facility] Gastroenterology Progress Note  [name] Rose 69 y.o. 1950/07/04  CC: Melena, acute blood loss anemia, need for anticoagulation   Subjective: Patient seen and examined at bedside.  Had one episode of black-colored stool yesterday.  No bowel movement today.  ROS : Afebrile.  Denies abdominal pain, nausea or vomiting.  1   Objective: Vital signs in last 24 hours: Vitals:   02/20/20 0409 02/20/20 1014  BP: 128/74 105/67  Pulse: 80 63  Resp: 18 18  Temp: (!) 97.5 F (36.4 C)   SpO2: 100% 100%    Physical Exam:  General: Resting in bed comfortably.  Not in acute distress Eyes: Extraocular movement intact Abdomen: Obese abdomen, soft, nontender, bowel sounds present, no peritoneal signs Neuro: Alert/oriented x3 Psych: Mood and affect normal  Lab Results: Recent Labs    02/18/20 0314 02/18/20 0314 02/19/20 0045 02/20/20 0620  NA 138   < > 137 138  K 3.4*   < > 3.5 3.5  CL 110   < > 108 110  CO2 17*   < > 18* 19*  GLUCOSE 115*   < > 107* 123*  BUN 95*   < > 82* 70*  CREATININE 7.70*   < > 6.96* 6.59*  CALCIUM 6.9*   < > 7.2* 7.0*  MG 0.9*  --  1.4*  --    < > = values in this interval not displayed.   Recent Labs    02/20/20 0620  AST 28  ALT 7  ALKPHOS 54  BILITOT 0.7  PROT 4.5*  ALBUMIN 1.9*   Recent Labs    02/19/20 1518 02/19/20 1518 02/19/20 2214 02/20/20 0620  WBC 6.2  --   --  6.0  NEUTROABS 4.4  --   --  4.2  HGB 6.7*   < > 7.5* 7.2*  HCT 20.6*   < > 23.1* 21.8*  MCV 98.1  --   --  97.8  PLT 94*  --   --  95*   < > = values in this interval not displayed.   Recent Labs    02/18/20 0746  LABPROT 17.6*  INR 1.5*      Assessment/Plan: - Melena.  EGD yesterday showed portal hypertensive gastropathy and gastric ulcer.  Capsule endoscopy images currently being downloaded to the computer. -Acute blood loss anemia.  Status post blood transfusion.  Hemoglobin stable today. -Atrial fibrillation.  Eliquis on hold. -History of  metastatic bladder cancer.  Recommendations ------------------------- -Change Protonix drip to IV twice daily PPI - Capsule endoscopy images currently being downloaded to the computer.  Hopefully will be able to review it tomorrow. -Monitor H&H.  Hold anticoagulation for today. -GI will follow.   Otis Brace MD, [place] 02/20/2020, 11:22 AM  Contact #  [phone]

## 2020-02-20 NOTE — Progress Notes (Signed)
OT Cancellation Note  Patient Details Name: [name] Rose MRN: [id number] DOB: 1950/06/24   Cancelled Treatment:    Reason Eval/Treat Not Completed: Fatigue/lethargy limiting ability to participate (pt declined OOB with OT) Will continue to follow.   Malka So 02/20/2020, 2:28 PM  Nestor Lewandowsky, OTR/L Acute Rehabilitation Services Pager: [phone] Office: [phone]

## 2020-02-21 LAB — CBC WITH DIFFERENTIAL/PLATELET
Abs Immature Granulocytes: 0.08 10*3/uL — ABNORMAL HIGH (ref 0.00–0.07)
Basophils Absolute: 0 10*3/uL (ref 0.0–0.1)
Basophils Relative: 1 %
Eosinophils Absolute: 0.2 10*3/uL (ref 0.0–0.5)
Eosinophils Relative: 3 %
HCT: 22.8 % — ABNORMAL LOW (ref 39.0–52.0)
Hemoglobin: 7.5 g/dL — ABNORMAL LOW (ref 13.0–17.0)
Immature Granulocytes: 1 %
Lymphocytes Relative: 11 %
Lymphs Abs: 0.7 10*3/uL (ref 0.7–4.0)
MCH: 32.2 pg (ref 26.0–34.0)
MCHC: 32.9 g/dL (ref 30.0–36.0)
MCV: 97.9 fL (ref 80.0–100.0)
Monocytes Absolute: 1.2 10*3/uL — ABNORMAL HIGH (ref 0.1–1.0)
Monocytes Relative: 19 %
Neutro Abs: 4.2 10*3/uL (ref 1.7–7.7)
Neutrophils Relative %: 65 %
Platelets: 87 10*3/uL — ABNORMAL LOW (ref 150–400)
RBC: 2.33 MIL/uL — ABNORMAL LOW (ref 4.22–5.81)
RDW: 16.7 % — ABNORMAL HIGH (ref 11.5–15.5)
WBC: 6.5 10*3/uL (ref 4.0–10.5)
nRBC: 0 % (ref 0.0–0.2)

## 2020-02-21 LAB — COMPREHENSIVE METABOLIC PANEL
ALT: 7 U/L (ref 0–44)
AST: 24 U/L (ref 15–41)
Albumin: 2 g/dL — ABNORMAL LOW (ref 3.5–5.0)
Alkaline Phosphatase: 61 U/L (ref 38–126)
Anion gap: 11 (ref 5–15)
BUN: 62 mg/dL — ABNORMAL HIGH (ref 8–23)
CO2: 18 mmol/L — ABNORMAL LOW (ref 22–32)
Calcium: 7.1 mg/dL — ABNORMAL LOW (ref 8.9–10.3)
Chloride: 108 mmol/L (ref 98–111)
Creatinine, Ser: 6.72 mg/dL — ABNORMAL HIGH (ref 0.61–1.24)
GFR, Estimated: 8 mL/min — ABNORMAL LOW (ref >60)
Glucose, Bld: 114 mg/dL — ABNORMAL HIGH (ref 70–99)
Potassium: 3.3 mmol/L — ABNORMAL LOW (ref 3.5–5.1)
Sodium: 137 mmol/L (ref 135–145)
Total Bilirubin: 0.5 mg/dL (ref 0.3–1.2)
Total Protein: 4.7 g/dL — ABNORMAL LOW (ref 6.5–8.1)

## 2020-02-21 LAB — MAGNESIUM: Magnesium: 1.2 mg/dL — ABNORMAL LOW (ref 1.7–2.4)

## 2020-02-21 MED ORDER — PANTOPRAZOLE SODIUM 40 MG PO TBEC
40.0000 mg | DELAYED_RELEASE_TABLET | Freq: Two times a day (BID) | ORAL | Status: DC
  Administered 2020-02-21 – 2020-02-25 (×8): 40 mg via ORAL
  Filled 2020-02-21 (×9): qty 1

## 2020-02-21 MED ORDER — MAGNESIUM SULFATE 4 GM/100ML IV SOLN
4.0000 g | Freq: Once | INTRAVENOUS | Status: AC
  Administered 2020-02-21: 4 g via INTRAVENOUS
  Filled 2020-02-21: qty 100

## 2020-02-21 MED ORDER — POTASSIUM CHLORIDE CRYS ER 20 MEQ PO TBCR
20.0000 meq | EXTENDED_RELEASE_TABLET | Freq: Once | ORAL | Status: AC
  Administered 2020-02-21: 20 meq via ORAL
  Filled 2020-02-21: qty 1

## 2020-02-21 NOTE — Progress Notes (Signed)
Physical Therapy Treatment Patient Details Name: [name] Rose MRN: [id number] DOB: 1950-09-12 Today's Date: 02/21/2020    History of Present Illness 69 yo male admitted with FTT, pancytopenia, thrombocytopenia, sepsis, weakness. hx of met prostate ca with bladder mets, Afib, CKD    PT Comments    Pt with improved mobility and activity tolerance. Pt feels he and his wife can manage at home at [place].    Follow Up Recommendations  Home health PT;Supervision/Assistance - 24 hour     Equipment Recommendations  None recommended by PT    Recommendations for Other Services       Precautions / Restrictions Precautions Precautions: Fall    Mobility  Bed Mobility Overal bed mobility: Needs Assistance Bed Mobility: Supine to Sit     Supine to sit: HOB elevated;Modified independent (Device/Increase time)     General bed mobility comments: Use of rail  Transfers Overall transfer level: Needs assistance Equipment used: Rolling walker (2 wheeled) Transfers: Sit to/from Stand Sit to Stand: Min assist;Min guard         General transfer comment: Min guard from bed and min assist from low recliner to bring hips up  Ambulation/Gait Ambulation/Gait assistance: Min guard Gait Distance (Feet): 110 Feet (x 2) Assistive device: Rolling walker (2 wheeled) Gait Pattern/deviations: Step-through pattern;Decreased stride length;Trunk flexed Gait velocity: decr Gait velocity interpretation: <1.31 ft/sec, indicative of household ambulator General Gait Details: Assist for safety but not physical assist   Stairs             Wheelchair Mobility    Modified Rankin (Stroke Patients Only)       Balance Overall balance assessment: Needs assistance Sitting-balance support: No upper extremity supported Sitting balance-Leahy Scale: Good     Standing balance support: Bilateral upper extremity supported Standing balance-Leahy Scale: Poor Standing balance comment: walker and min  guard for static standing                            Cognition Arousal/Alertness: Awake/alert Behavior During Therapy: WFL for tasks assessed/performed Overall Cognitive Status: Within Functional Limits for tasks assessed                                        Exercises      General Comments General comments (skin integrity, edema, etc.): HR to 120's with ambulation      Pertinent Vitals/Pain Pain Assessment: No/denies pain    Home Living                      Prior Function            PT Goals (current goals can now be found in the care plan section) Acute Rehab PT Goals Patient Stated Goal: get stronger and go home Progress towards PT goals: Progressing toward goals    Frequency    Min 3X/week      PT Plan Current plan remains appropriate    Co-evaluation              AM-PAC PT "6 Clicks" Mobility   Outcome Measure  Help needed turning from your back to your side while in a flat bed without using bedrails?: A Little Help needed moving from lying on your back to sitting on the side of a flat bed without using bedrails?: A Little Help needed moving to  and from a bed to a chair (including a wheelchair)?: A Little Help needed standing up from a chair using your arms (e.g., wheelchair or bedside chair)?: A Little Help needed to walk in hospital room?: A Little Help needed climbing 3-5 steps with a railing? : A Little 6 Click Score: 18    End of Session Equipment Utilized During Treatment: Gait belt Activity Tolerance: Patient tolerated treatment well Patient left: with call bell/phone within reach;Other (comment) (on bsc) Nurse Communication: Mobility status PT Visit Diagnosis: Muscle weakness (generalized) (M62.81);History of falling (Z91.81);Repeated falls (R29.6)     Time: 9784-7841 PT Time Calculation (min) (ACUTE ONLY): 17 min  Charges:  $Gait Training: 8-22 mins                     [place] Pager [phone] Office [place] 02/21/2020, 9:26 AM

## 2020-02-21 NOTE — Brief Op Note (Signed)
   10:56 AM  PATIENT:  [name] Rose  69 y.o. male  PRE-OPERATIVE DIAGNOSIS:  melena, anemia  PROCEDURE: Capsule endoscopy  Findings ------------ -Capsule retained in the esophagus for around 2 hours. -Gastric mucosal inflammation without any active bleeding seen. -Fair prep in the distal small bowel but otherwise no evidence of active bleeding in the small bowel. -Capsule reached ileocecal valve.  Mild inflammation at the ileocecal valve seen.  Not able to confirm cecal images. - No active bleeding seen.   Otis Brace MD, [place] 02/21/2020, 10:59 AM  Contact #  [phone]

## 2020-02-21 NOTE — Progress Notes (Signed)
Pt assisted with pericare after toileting. Sat in chair for grooming with set up. Demonstrated ability to doff socks without assist and don with min assist.    02/21/20 1000  OT Visit Information  Last OT Received On 02/21/20  Assistance Needed +1  History of Present Illness 69 yo male admitted with FTT, pancytopenia, thrombocytopenia, sepsis, weakness. hx of met prostate ca with bladder mets, Afib, CKD  Precautions  Precautions Fall  Pain Assessment  Pain Assessment No/denies pain  Cognition  Arousal/Alertness Awake/alert  Behavior During Therapy WFL for tasks assessed/performed  Overall Cognitive Status Within Functional Limits for tasks assessed  ADL  Overall ADL's  Needs assistance/impaired  Grooming Wash/dry hands;Wash/dry face;Oral care;Sitting;Set up  Lower Body Dressing Minimal assistance;Sitting/lateral leans  Lower Body Dressing Details (indicate cue type and reason) doffed socks without assist, min assist to don L sock  Toilet Transfer Min guard;Stand-pivot;BSC;RW  Toileting- Clothing Manipulation and Hygiene Total assistance;Sit to/from stand  Bed Mobility  General bed mobility comments pt on BSC upon arrival  Balance  Overall balance assessment Needs assistance  Sitting balance-Leahy Scale Good  Standing balance-Leahy Scale Poor  Transfers  Overall transfer level Needs assistance  Equipment used Rolling walker (2 wheeled)  Transfers Sit to/from Stand  Sit to Stand Min guard  General transfer comment increased time, min guard from [facility]  OT - End of Session  Equipment Utilized During Treatment Rolling walker;Gait belt  Activity Tolerance Patient tolerated treatment well  Patient left in chair;with call bell/phone within reach  OT Assessment/Plan  OT Plan Discharge plan remains appropriate  OT Visit Diagnosis Muscle weakness (generalized) (M62.81)  OT Frequency (ACUTE ONLY) Min 2X/week  Follow Up Recommendations Home health OT  OT Equipment None recommended by OT   AM-PAC OT "6 Clicks" Daily Activity Outcome Measure (Version 2)  Help from another person eating meals? 4  Help from another person taking care of personal grooming? 3  Help from another person toileting, which includes using toliet, bedpan, or urinal? 1  Help from another person bathing (including washing, rinsing, drying)? 2  Help from another person to put on and taking off regular upper body clothing? 3  Help from another person to put on and taking off regular lower body clothing? 3  6 Click Score 16  OT Goal Progression  Progress towards OT goals Progressing toward goals  Acute Rehab OT Goals  Patient Stated Goal get stronger and go home  OT Goal Formulation With patient  Time For Goal Achievement 03/03/20  Potential to Achieve Goals Good  OT Time Calculation  OT Start Time (ACUTE ONLY) 0910  OT Stop Time (ACUTE ONLY) 0927  OT Time Calculation (min) 17 min  OT General Charges  $OT Visit 1 Visit  OT Treatments  $Self Care/Home Management  8-22 mins  Nestor Lewandowsky, OTR/L Acute Rehabilitation Services Pager: [phone] Office: [phone]

## 2020-02-21 NOTE — Progress Notes (Addendum)
[facility]  PROGRESS NOTE  [name] Rose:[id number] DOB: [birth year] DOA: 02/03/2020 PCP: Shirline Frees, MD Admit date - 02/03/2020   Admitting Physician Harold Hedge, MD  Outpatient Primary MD for the patient is Shirline Frees, MD  LOS - 69 Brief Narrative  [name] Rose a 69 year old male with medical history significant for metastatic bladder cancer on chemotherapy, PAF, CKD stage III who presented to the ED on 10/24 with complaints of generalized weakness and diarrhea for the past 4 to 5 days and was made with working diagnosis of failure to thrive in adult in the setting of recent chemotherapy complicated by diarrhea and severe pancytopenia (WBC of 0.2) adn BP 99/70.  During hospitalization antibiotics were not initially started given patient was afebrile but on night of admission patient became febrile and was started on cefepime for management of sepsis secondary to neutropenia blood cultures are positive for Klebsiella pneumonia.  Hospital course further complicated by AKI on CKD in setting of hypotension likely related to sepsis as well as acute blood loss anemia of unclear etiology.  EGD was unremarkable patient currently awaiting capsule endoscopy results.  Subjective  Today complains of itching of rash on back.  Denies any shortness of breath.  Had a BM today after walking in [facility]   Sepsis secondary to Klebsiella pneumonia in the setting of neutropenia, resolved.  Patient completed course of antibiotics.  Sepsis physiology resolved.  Nonoliguric AKI on CKD stage IV improving.  Believed to be ATN related to hypotension from sepsis physiology as well as low BP related to acute blood loss anemia suspected to be GI bleed.  Patient is still making adequate urine, , no edema, No electrolyte abnormalities.  Has mild normal anion gap metabolic acidosis -Continue to avoid nephrotoxins -Continue to monitor renal function/urine output, daily BMP -Appreciate nephrology  recommendations, continue sodium bicarb  Acute blood loss anemia, resolved.  Initially suspected upper GI given reported melena.  EGD on 11/9 showed portal hypertensive gastropathy and gastric ulcer.  Currently his hemoglobin remained stable today. No bleeding on capsule endoscopy -Appreciate GI recommendations, continue p.o. PPI BID x 2 months then switch to daily, avoid NSAIDs, ok to resume anticoagulation from GI perspective -Continue to monitor daily CBC (hemoglobin) -Continue to hold home Eliquis  Metastatic bladder cancer on chemotherapy with pancytopenia.  White count now normal limits.  Hemoglobin stable to slightly worsened in the setting of GI bleed).  Foley cath continue to improve currently 95. Continue monitor CBC-appreciate oncology recommendations  Atrial fibrillation, currently rate controlled.  Had RVR while only on metoprolol during hospital stay, diltiazem gtt weaned off -holding home Eliquis given acute blood loss anemia suspected to be GI bleed as mentioned above and pancytopenia -Holding home Tikosyn given AKI -currently rate controlled on metoprolol and diltiazem , closely monitor on telemetry   Family Communication  : Wife updated at bedside on11/10  Code Status : Full  Disposition Plan  :  Patient is from home. Anticipated d/c date: 2 to 3 days. Barriers to d/c or necessity for inpatient status: close monitoring of kidney function and hemoglobin.  Consults  : Nephrology, oncology, GI  Procedures  : EGD, 11/9, capsule endoscopy  DVT Prophylaxis  : SCDs  MDM: The below labs and imaging reports were reviewed and summarized above.  Medication management as above.  Lab Results  Component Value Date   PLT 87 (L) 02/21/2020    Diet :  Diet Order  Diet Heart Room service appropriate? Yes; Fluid consistency: Thin  Diet effective now                  Inpatient Medications Scheduled Meds: . sodium chloride   Intravenous Once  . sodium chloride    Intravenous Once  . Chlorhexidine Gluconate Cloth  6 each Topical Daily  . diltiazem  180 mg Oral Daily  . metoprolol tartrate  50 mg Oral BID  . oxybutynin  10 mg Oral QHS  . pantoprazole (PROTONIX) IV  40 mg Intravenous Q12H  . sodium bicarbonate  1,300 mg Oral BID  . sodium chloride flush  10-40 mL Intracatheter Q12H   Continuous Infusions: . sodium chloride Stopped (02/19/20 1200)   PRN Meds:.acetaminophen **OR** [DISCONTINUED] acetaminophen, diphenhydrAMINE, Gerhardt's butt cream, hydrOXYzine, melatonin, ondansetron **OR** ondansetron (ZOFRAN) IV, sodium chloride flush  Antibiotics  :   Anti-infectives (From admission, onward)   Start     Dose/Rate Route Frequency Ordered Stop   02/15/20 2000  cephALEXin (KEFLEX) capsule 500 mg        500 mg Oral Every 12 hours 02/15/20 0853 02/16/20 2152   02/13/20 0815  cephALEXin (KEFLEX) capsule 500 mg  Status:  Discontinued        500 mg Oral Every 12 hours 02/13/20 0754 02/15/20 0853   02/12/20 0900  ceFAZolin (ANCEF) IVPB 1 g/50 mL premix  Status:  Discontinued        1 g 100 mL/hr over 30 Minutes Intravenous Every 24 hours 02/11/20 1035 02/13/20 0754   02/06/20 1200  ceFAZolin (ANCEF) IVPB 2g/100 mL premix  Status:  Discontinued        2 g 200 mL/hr over 30 Minutes Intravenous Every 12 hours 02/06/20 1103 02/11/20 1035   02/05/20 0600  ceFEPIme (MAXIPIME) 2 g in sodium chloride 0.9 % 100 mL IVPB  Status:  Discontinued        2 g 200 mL/hr over 30 Minutes Intravenous Every 24 hours 02/04/20 0719 02/04/20 1150   02/04/20 1400  cefTRIAXone (ROCEPHIN) 2 g in sodium chloride 0.9 % 100 mL IVPB  Status:  Discontinued        2 g 200 mL/hr over 30 Minutes Intravenous Every 24 hours 02/04/20 1150 02/06/20 1103   02/03/20 1830  ceFEPIme (MAXIPIME) 2 g in sodium chloride 0.9 % 100 mL IVPB  Status:  Discontinued        2 g 200 mL/hr over 30 Minutes Intravenous Every 12 hours 02/03/20 1825 02/04/20 0719       Objective   Vitals:   02/21/20  0732 02/21/20 0928 02/21/20 1115 02/21/20 1400  BP: 136/85 108/65 119/83 123/66  Pulse: 86 84 71 69  Resp: $Remo'20 18 18 17  'OqNUP$ Temp: (!) 97.5 F (36.4 C) 98 F (36.7 C) 97.8 F (36.6 C) 97.6 F (36.4 C)  TempSrc: Oral Oral Oral Oral  SpO2: 99% 99% 100% 99%  Weight:      Height:        SpO2: 99 % O2 Flow Rate (L/min): 0 L/min  Wt Readings from Last 3 Encounters:  02/21/20 122 kg  01/28/20 122.7 kg  01/21/20 122.9 kg     Intake/Output Summary (Last 24 hours) at 02/21/2020 1456 Last data filed at 02/21/2020 0758 Gross per 24 hour  Intake 880 ml  Output 3425 ml  Net -2545 ml    Physical Exam:     Awake Alert, Oriented X 3, Normal affect No new F.N deficits,  [place].AT, Normal respiratory  effort on room air, CTAB RRR,No Gallops,Rubs or new Murmurs,  +ve B.Sounds, Abd Soft, No tenderness, No rebound, guarding or rigidity. No Cyanosis,   Back: Scattered maculopapular erythematous, pruritic rash     I have personally reviewed the following:   Data Reviewed:  CBC Recent Labs  Lab 02/15/20 0131 02/15/20 0131 02/16/20 0610 02/16/20 1051 02/17/20 0438 02/17/20 0438 02/18/20 0314 02/18/20 0314 02/19/20 0230 02/19/20 1518 02/19/20 2214 02/20/20 0620 02/21/20 0157  WBC 6.6   < > 5.8   < > 6.1  --  6.6  --   --  6.2  --  6.0 6.5  HGB 7.4*   < > 6.3*   < > 7.8*   < > 6.7*   < > 6.9* 6.7* 7.5* 7.2* 7.5*  HCT 22.7*   < > 19.6*   < > 23.7*   < > 19.9*   < > 20.6* 20.6* 23.1* 21.8* 22.8*  PLT 53*   < > 58*   < > 76*  --  80*  --   --  94*  --  95* 87*  MCV 100.9*   < > 101.6*   < > 96.3  --  96.1  --   --  98.1  --  97.8 97.9  MCH 32.9   < > 32.6   < > 31.7  --  32.4  --   --  31.9  --  32.3 32.2  MCHC 32.6   < > 32.1   < > 32.9  --  33.7  --   --  32.5  --  33.0 32.9  RDW 15.3   < > 15.4   < > 17.2*  --  16.8*  --   --  16.8*  --  16.7* 16.7*  LYMPHSABS 0.5*  --  0.6*  --   --   --   --   --   --  0.6*  --  0.6* 0.7  MONOABS 0.9  --  0.8  --   --   --   --   --   --   1.0  --  1.1* 1.2*  EOSABS 0.3  --  0.2  --   --   --   --   --   --  0.1  --  0.1 0.2  BASOSABS 0.0  --  0.0  --   --   --   --   --   --  0.0  --  0.0 0.0   < > = values in this interval not displayed.    Chemistries  Recent Labs  Lab 02/17/20 0438 02/18/20 0314 02/19/20 0045 02/20/20 0620 02/21/20 0157  NA 139 138 137 138 137  K 3.7 3.4* 3.5 3.5 3.3*  CL 110 110 108 110 108  CO2 20* 17* 18* 19* 18*  GLUCOSE 114* 115* 107* 123* 114*  BUN 85* 95* 82* 70* 62*  CREATININE 7.63* 7.70* 6.96* 6.59* 6.72*  CALCIUM 7.1* 6.9* 7.2* 7.0* 7.1*  MG  --  0.9* 1.4*  --  1.2*  AST  --   --   --  28 24  ALT  --   --   --  7 7  ALKPHOS  --   --   --  54 61  BILITOT  --   --   --  0.7 0.5   ------------------------------------------------------------------------------------------------------------------ No results for input(s): CHOL, HDL, LDLCALC, TRIG, CHOLHDL, LDLDIRECT in the last 72 hours.  Lab  Results  Component Value Date   HGBA1C 4.9 10/05/2018   ------------------------------------------------------------------------------------------------------------------ No results for input(s): TSH, T4TOTAL, T3FREE, THYROIDAB in the last 72 hours.  Invalid input(s): FREET3 ------------------------------------------------------------------------------------------------------------------ No results for input(s): VITAMINB12, FOLATE, FERRITIN, TIBC, IRON, RETICCTPCT in the last 72 hours.  Coagulation profile Recent Labs  Lab 02/18/20 0746  INR 1.5*    No results for input(s): DDIMER in the last 72 hours.  Cardiac Enzymes No results for input(s): CKMB, TROPONINI, MYOGLOBIN in the last 168 hours.  Invalid input(s): CK ------------------------------------------------------------------------------------------------------------------ No results found for: BNP  Micro Results No results found for this or any previous visit (from the past 240 hour(s)).  Radiology Reports CT ABDOMEN  PELVIS WO CONTRAST  Result Date: 02/07/2020 CLINICAL DATA:  Generalized weakness, diarrhea, decreased appetite, hypotensive, leukopenia, thrombocytopenia, history metastatic bladder cancer, paroxysmal atrial fibrillation, hypertension, stage IV chronic kidney disease, question bowel obstruction EXAM: CT ABDOMEN AND PELVIS WITHOUT CONTRAST TECHNIQUE: Multidetector CT imaging of the abdomen and pelvis was performed following the standard protocol without IV contrast. Sagittal and coronal MPR images reconstructed from axial data set. Water as oral contrast. COMPARISON:  01/05/2018 FINDINGS: Lower chest: RIGHT lower lobe bronchiectasis. Bibasilar atelectasis. Small LEFT pleural effusion. Hepatobiliary: Low-attenuation foci within liver question cysts unchanged, largest 15 x 14 mm lateral segment LEFT lobe image 19. Gallbladder unremarkable. Pancreas: Atrophic pancreas without mass Spleen: Normal appearance Adrenals/Urinary Tract: Adrenal glands, kidneys, and ureters normal appearance. Bladder wall appears thickened but bladder is underdistended, question artifact. Stomach/Bowel: Minimal sigmoid diverticulosis without evidence of diverticulitis. Diffuse dilatation of small bowel loops throughout abdomen. Stomach distended by fluid. Fluid within distended thus distal esophagus question reflux. Small bowel loops are but question of bowel wall thickening of the distal ileum is raised. Mild gaseous distension of ascending and transverse colon. Descending and sigmoid colon decompressed without discrete transition zone. Vascular/Lymphatic: Atherosclerotic calcifications aorta and iliac arteries without aneurysm. Coronary arterial calcifications. No adenopathy. Reproductive: Prior prostatectomy. Other: No free air. Small amount of fluid at RIGHT gutter. Minimal stranding of mesentery. Musculoskeletal: Degenerative disc disease changes thoracolumbar spine. IMPRESSION: Diffuse dilatation of small bowel loops throughout abdomen  to distal ileum question obstruction. Suspected bowel wall thickening of distal ileum, uncertain etiology but could represent inflammatory bowel disease or infection, tumor considered less likely due to length of involvement; suspect this is responsible for the observed component of small-bowel obstruction. Small amount of nonspecific free fluid. Minimal sigmoid diverticulosis without evidence of diverticulitis. RIGHT lower lobe bronchiectasis with bibasilar atelectasis and small LEFT pleural effusion. Aortic Atherosclerosis (ICD10-I70.0). Electronically Signed   By: Lavonia Dana M.D.   On: 02/07/2020 13:59   DG Abd 1 View  Result Date: 02/10/2020 CLINICAL DATA:  Small bowel obstruction. EXAM: ABDOMEN - 1 VIEW COMPARISON:  February 09, 2020 FINDINGS: Interval placement of enteric catheter, with side hole at the expected location of the GE junction. Persistent dilation of small bowel loops with mild wall thickening in the mid abdomen, with maximum diameter of 5 cm. IMPRESSION: 1. Interval placement of enteric catheter, with side hole at the expected location of the GE junction. Advancement is recommended. 2. Persistent small bowel obstruction. Electronically Signed   By: Fidela Salisbury M.D.   On: 02/10/2020 17:03   DG Abd 1 View  Result Date: 02/09/2020 CLINICAL DATA:  Ileus. EXAM: ABDOMEN - 1 VIEW COMPARISON:  February 08, 2020 FINDINGS: Persistent dilation of small bowel loops throughout the abdomen measuring up to 5 cm in diameter, with associated mild small bowel wall  thickening. The colon is decompressed with residual foci of gas. IMPRESSION: Persistent small bowel dilation throughout the abdomen, with associated mild small bowel wall thickening, suggestive of ongoing small bowel obstruction versus ileus. Supine positioning precludes proper evaluation for free intra-abdominal gas. Electronically Signed   By: Fidela Salisbury M.D.   On: 02/09/2020 13:23   DG Abd 1 View  Result Date:  02/08/2020 CLINICAL DATA:  Follow-up ileus. EXAM: ABDOMEN - 1 VIEW COMPARISON:  Abdomen 02/07/2020.  CT 02/07/2020. FINDINGS: NG tube noted with tip over stomach. Persistent prominently dilated loops of small bowel noted. Colonic dilatation also noted. No free air identified. Degenerative change in scoliosis lumbar spine. Degenerative changes right hip. Total left hip replacement. IMPRESSION: 1.  NG tube noted with tip over stomach. 2. Persistent prominently dilated loops of small bowel again noted. Colonic dilatation also noted. Similar findings noted on prior exams. Findings consistent with persistent ileus. Continued follow-up exam suggested to demonstrate resolution and to exclude bowel obstruction. Electronically Signed   By: Marcello Moores  Register   On: 02/08/2020 07:35   US RENAL  Result Date: 02/05/2020 CLINICAL DATA:  Acute kidney injury EXAM: RENAL / URINARY TRACT ULTRASOUND COMPLETE COMPARISON:  09/21/2019 FINDINGS: Right Kidney: Renal measurements: 11.3 x 5.5 x 5.7 cm = volume: 185 mL. Echogenicity within normal limits. No mass or hydronephrosis visualized. Left Kidney: Renal measurements: 13.1 x 6 x 5.2 cm = volume: 212 mL. Echogenicity within normal limits. No mass or hydronephrosis visualized. Bladder: Appears normal for degree of bladder distention. Other: Incidental note made of echogenic hepatic parenchyma. IMPRESSION: 1. Normal ultrasound appearance of the kidneys 2. Liver appears slightly echogenic suggesting steatosis Electronically Signed   By: Donavan Foil M.D.   On: 02/05/2020 18:46   DG Abd Portable 1V  Result Date: 02/12/2020 CLINICAL DATA:  Ileus. EXAM: PORTABLE ABDOMEN - 1 VIEW COMPARISON:  02/10/2020 FINDINGS: Gaseous small bowel distention seen on the previous exam has decreased in the interval. A few scattered small bowel loops remain minimally distended up to about 3.4 cm. Gas is visible in the right and left colon. Degenerative changes noted lumbar spine. IMPRESSION: Interval  decrease in gaseous bowel distention. Electronically Signed   By: Misty Stanley M.D.   On: 02/12/2020 08:05   DG Abd Portable 1V  Result Date: 02/07/2020 CLINICAL DATA:  69 year old male encounter for NG placement. EXAM: PORTABLE ABDOMEN - 1 VIEW COMPARISON:  Earlier radiograph dated 02/07/2020. FINDINGS: Interval advancement of the enteric tube with side-port and tip in the proximal stomach. Partially visualized central venous line with tip over the cavoatrial junction. IMPRESSION: Enteric tube with tip in the proximal stomach. Electronically Signed   By: Anner Crete M.D.   On: 02/07/2020 17:43   DG Abd Portable 1V  Result Date: 02/07/2020 CLINICAL DATA:  NG tube placement. EXAM: PORTABLE ABDOMEN - 1 VIEW COMPARISON:  CT 02/07/2020. FINDINGS: PowerPort catheter noted tip over cavoatrial junction. NG tube noted with tip at the gastroesophageal junction. Consider advancement of approximately 15 cm. Dilated loops of small bowel again noted. No free air identified. IMPRESSION: 1. NG tube with tip at the gastroesophageal junction. Consider advancement of approximately 15 cm. 2. Dilated loops of small bowel again noted. Electronically Signed   By: Marcello Moores  Register   On: 02/07/2020 16:22   DG Abd Portable 1V  Result Date: 02/07/2020 CLINICAL DATA:  Abdominal distension. EXAM: PORTABLE ABDOMEN - 1 VIEW COMPARISON:  12/20/2019 nuclear medicine PET. FINDINGS: Dilated small and large bowel loops. No pneumoperitoneum. No  air-fluid levels. No suspicious calcific densities. Multilevel spondylosis. Sequela of left hip arthroplasty. IMPRESSION: Dilated small and large bowel loops, concerning for ileus versus obstruction. These results will be called to the ordering clinician or representative by the Radiologist Assistant, and communication documented in the PACS or [employer]. Electronically Signed   By: Primitivo Gauze M.D.   On: 02/07/2020 08:10     Time Spent in minutes  30     Desiree Hane M.D on 02/21/2020 at 2:56 PM  To page go to [email or web address] - password [facility]

## 2020-02-21 NOTE — Progress Notes (Signed)
[facility] Progress Note  [name] Rose 69 y.o. 24-May-1950  CC: Melena, acute blood loss anemia, need for anticoagulation   Subjective: Patient seen and examined at bedside.  Resting comfortably in the recliner.  Denies abdominal pain.  Denies blood or bleeding episodes.  ROS : Afebrile.  Denies , nausea or vomiting.     Objective: Vital signs in last 24 hours: Vitals:   02/21/20 0732 02/21/20 0928  BP: 136/85 108/65  Pulse: 86 84  Resp: 20 18  Temp: (!) 97.5 F (36.4 C) 98 F (36.7 C)  SpO2: 99% 99%    Physical Exam:  General: Resting in bed comfortably.  Not in acute distress Eyes: Extraocular movement intact Abdomen: Obese abdomen, soft, nontender, bowel sounds present, no peritoneal signs Neuro: Alert/oriented x3 Psych: Mood and affect normal  Lab Results: Recent Labs    02/19/20 0045 02/19/20 0045 02/20/20 0620 02/21/20 0157  NA 137   < > 138 137  K 3.5   < > 3.5 3.3*  CL 108   < > 110 108  CO2 18*   < > 19* 18*  GLUCOSE 107*   < > 123* 114*  BUN 82*   < > 70* 62*  CREATININE 6.96*   < > 6.59* 6.72*  CALCIUM 7.2*   < > 7.0* 7.1*  MG 1.4*  --   --  1.2*   < > = values in this interval not displayed.   Recent Labs    02/20/20 0620 02/21/20 0157  AST 28 24  ALT 7 7  ALKPHOS 54 61  BILITOT 0.7 0.5  PROT 4.5* 4.7*  ALBUMIN 1.9* 2.0*   Recent Labs    02/20/20 0620 02/21/20 0157  WBC 6.0 6.5  NEUTROABS 4.2 4.2  HGB 7.2* 7.5*  HCT 21.8* 22.8*  MCV 97.8 97.9  PLT 95* 87*   No results for input(s): LABPROT, INR in the last 72 hours.    Assessment/Plan: - Melena.  EGD  showed portal hypertensive gastropathy and gastric ulcer.  Capsule endoscopy negative for active bleeding. -Acute blood loss anemia.  Status post blood transfusion.  Hemoglobin stable today. -Atrial fibrillation.  Eliquis on hold. -History of metastatic bladder cancer.  Recommendations ------------------------- -Change Protonix to p.o. twice a day.  Continue  p.o. twice daily PPI for next 2 months and then switch to Protonix 40 mg once a day. -Avoid NSAIDs -Okay to resume anticoagulation from GI standpoint. -GI will sign off.  Call us back if needed.  Follow-up with Dr. Michail Sermon in 4 to 6 weeks after discharge.  Otis Brace MD, [place] 02/21/2020, 10:59 AM  Contact #  [phone]

## 2020-02-21 NOTE — Progress Notes (Signed)
Admit: 02/03/2020 LOS: 28  69 y.o. year-old Afib, on Eliquis, HTN, CKD 4 baseline creat is 1.9- 2.6 from 2021and bladder cancer w/ mets on chemoRx presented on 10/24 w/ gen'd weakness, diarrhea for 5 days, no appetite or fevers.  Consulted for AKI likely secondary to hypotension causing ATN.    Subjective:   No acute events overnight.  Voiding well.  No abdominal pain, nausea or vominting. Denies any chest pain or SOB.   11/10 0701 - 11/11 0700 In: 7416 [P.O.:1360; I.V.:20] Out: 3845 [Urine:3325]  Filed Weights   02/19/20 1023 02/20/20 0409 02/21/20 0133  Weight: 119.3 kg 122.5 kg 122 kg    Scheduled Meds:  sodium chloride   Intravenous Once   sodium chloride   Intravenous Once   Chlorhexidine Gluconate Cloth  6 each Topical Daily   diltiazem  180 mg Oral Daily   metoprolol tartrate  50 mg Oral BID   oxybutynin  10 mg Oral QHS   pantoprazole (PROTONIX) IV  40 mg Intravenous Q12H   sodium bicarbonate  1,300 mg Oral BID   sodium chloride flush  10-40 mL Intracatheter Q12H   Continuous Infusions:  sodium chloride Stopped (02/19/20 1200)   diltiazem (CARDIZEM) infusion 5 mg/hr (02/20/20 0211)   Current Labs: reviewed Hbg 7.5 Cr 6.72, BUN 62, CO2 18, K 3.3, Mag 1.2   Physical Exam:  Blood pressure 118/64, pulse 77, temperature 98.4 F (36.9 C), temperature source Oral, resp. rate 18, height '6\' 2"'  (1.88 m), weight 122 kg, SpO2 98 %.   GEN: laying in bed, in no acute distress ENT: no nasal discharge, mmm EYES: no scleral icterus, eomi CV: normal rate, no murmurs PULM: no iwob, bilateral chest rise ABD: NABS, non-distended SKIN: no rashes or jaundice EXT: no edema, warm and well perfused   A/P  Acute on Chronic Kidney Disease 4 Baseline creat is 1.9- 2.6 from 2021, eGFR 26- 34 ml/min. Creat 2.5 on admitand increase significantly likely due to ATN.   -Creatinine maintaining -Continue po hydration -Continue to monitor creatinine daily  -I do not think dialysis  is warranted at this time.   AFIB Uncontrolled HR overnight. Off Cardizem drip Eliquis on hold for Endoscopy Continue rate control medication per primary team  Metastatic Bladder Cancer Per primary MD  Thrombocytopenia Improving Monitor daily  Electrolyte Imbalances Replete Magnesium and Potassium as needed  Anemia Likely multifactorial.  Has received multiple PRBC transfusions this admissions. -GI following, recommends video capsule endoscopy -EGD yesterday shows gastritis, porta hypertensive gastropathy, non-bleeding gastric ulcer with no bleeding and normal duodenum. -On Protonix drip Avoid ESA given malignancy Continue transfusions per primary MD  Carollee Leitz, MD [facility]  02/21/2020, 6:46 AM  Recent Labs  Lab 02/19/20 0045 02/20/20 0620 02/21/20 0157  NA 137 138 137  K 3.5 3.5 3.3*  CL 108 110 108  CO2 18* 19* 18*  GLUCOSE 107* 123* 114*  BUN 82* 70* 62*  CREATININE 6.96* 6.59* 6.72*  CALCIUM 7.2* 7.0* 7.1*   Recent Labs  Lab 02/19/20 1518 02/19/20 1518 02/19/20 2214 02/20/20 0620 02/21/20 0157  WBC 6.2  --   --  6.0 6.5  NEUTROABS 4.4  --   --  4.2 4.2  HGB 6.7*   < > 7.5* 7.2* 7.5*  HCT 20.6*   < > 23.1* 21.8* 22.8*  MCV 98.1  --   --  97.8 97.9  PLT 94*  --   --  95* 87*   < > = values in  this interval not displayed.    Plan communicated to the primary team

## 2020-02-22 ENCOUNTER — Telehealth: Payer: Self-pay | Admitting: *Deleted

## 2020-02-22 DIAGNOSIS — N1831 Chronic kidney disease, stage 3a: Secondary | ICD-10-CM | POA: Diagnosis not present

## 2020-02-22 DIAGNOSIS — N170 Acute kidney failure with tubular necrosis: Secondary | ICD-10-CM | POA: Diagnosis not present

## 2020-02-22 LAB — COMPREHENSIVE METABOLIC PANEL
ALT: 6 U/L (ref 0–44)
AST: 21 U/L (ref 15–41)
Albumin: 2 g/dL — ABNORMAL LOW (ref 3.5–5.0)
Alkaline Phosphatase: 61 U/L (ref 38–126)
Anion gap: 8 (ref 5–15)
BUN: 55 mg/dL — ABNORMAL HIGH (ref 8–23)
CO2: 21 mmol/L — ABNORMAL LOW (ref 22–32)
Calcium: 7.4 mg/dL — ABNORMAL LOW (ref 8.9–10.3)
Chloride: 106 mmol/L (ref 98–111)
Creatinine, Ser: 6.65 mg/dL — ABNORMAL HIGH (ref 0.61–1.24)
GFR, Estimated: 8 mL/min — ABNORMAL LOW (ref >60)
Glucose, Bld: 105 mg/dL — ABNORMAL HIGH (ref 70–99)
Potassium: 3.2 mmol/L — ABNORMAL LOW (ref 3.5–5.1)
Sodium: 135 mmol/L (ref 135–145)
Total Bilirubin: 0.5 mg/dL (ref 0.3–1.2)
Total Protein: 4.9 g/dL — ABNORMAL LOW (ref 6.5–8.1)

## 2020-02-22 LAB — CBC WITH DIFFERENTIAL/PLATELET
Abs Immature Granulocytes: 0.07 10*3/uL (ref 0.00–0.07)
Basophils Absolute: 0.1 10*3/uL (ref 0.0–0.1)
Basophils Relative: 1 %
Eosinophils Absolute: 0.2 10*3/uL (ref 0.0–0.5)
Eosinophils Relative: 3 %
HCT: 25 % — ABNORMAL LOW (ref 39.0–52.0)
Hemoglobin: 8 g/dL — ABNORMAL LOW (ref 13.0–17.0)
Immature Granulocytes: 1 %
Lymphocytes Relative: 12 %
Lymphs Abs: 0.8 10*3/uL (ref 0.7–4.0)
MCH: 31.9 pg (ref 26.0–34.0)
MCHC: 32 g/dL (ref 30.0–36.0)
MCV: 99.6 fL (ref 80.0–100.0)
Monocytes Absolute: 1.2 10*3/uL — ABNORMAL HIGH (ref 0.1–1.0)
Monocytes Relative: 18 %
Neutro Abs: 4.1 10*3/uL (ref 1.7–7.7)
Neutrophils Relative %: 65 %
Platelets: 105 10*3/uL — ABNORMAL LOW (ref 150–400)
RBC: 2.51 MIL/uL — ABNORMAL LOW (ref 4.22–5.81)
RDW: 16.7 % — ABNORMAL HIGH (ref 11.5–15.5)
WBC: 6.3 10*3/uL (ref 4.0–10.5)
nRBC: 0.3 % — ABNORMAL HIGH (ref 0.0–0.2)

## 2020-02-22 LAB — MAGNESIUM: Magnesium: 1.6 mg/dL — ABNORMAL LOW (ref 1.7–2.4)

## 2020-02-22 MED ORDER — POTASSIUM CHLORIDE CRYS ER 20 MEQ PO TBCR
40.0000 meq | EXTENDED_RELEASE_TABLET | Freq: Once | ORAL | Status: AC
  Administered 2020-02-22: 40 meq via ORAL
  Filled 2020-02-22: qty 2

## 2020-02-22 MED ORDER — APIXABAN 5 MG PO TABS
5.0000 mg | ORAL_TABLET | Freq: Two times a day (BID) | ORAL | Status: DC
  Administered 2020-02-22 – 2020-02-25 (×6): 5 mg via ORAL
  Filled 2020-02-22 (×6): qty 1

## 2020-02-22 MED ORDER — MAGNESIUM SULFATE 4 GM/100ML IV SOLN
4.0000 g | Freq: Once | INTRAVENOUS | Status: AC
  Administered 2020-02-22: 4 g via INTRAVENOUS
  Filled 2020-02-22: qty 100

## 2020-02-22 NOTE — TOC Initial Note (Signed)
Transition of Care [facility]) - Initial/Assessment Note    Patient Details  Name: [name] Rose MRN: [id number] Date of Birth: [birth year]  Transition of Care [facility]) CM/SW Contact:    Zenon Mayo, RN Phone Number: 02/22/2020, 3:28 PM  Clinical Narrative:                 Patient is from home with spouse, he has w/chair, walker and cane at home.  He has no issues with transportation.  Wife is with him most of time except for when she goes to the grocery store etc.  NCM offered choice, he chose [place], NCM made referral to [facility] , he is able to take referral.  Soc will begin 24 to 48 hrs post dc.    Expected Discharge Plan: [place] Barriers to Discharge: Continued Medical Work up   Patient Goals and CMS Choice Patient states their goals for this hospitalization and ongoing recovery are:: to be able to do what he was able to do before CMS Medicare.gov Compare Post Acute Care list provided to:: Patient Choice offered to / list presented to : Patient  Expected Discharge Plan and Services Expected Discharge Plan: [place]   Discharge Planning Services: CM Consult Post Acute Care Choice: [place] arrangements for the past 2 months: Single Family Home                   DME Agency: NA       HH Arranged: PT, OT HH Agency: [place] Date [facility] Agency Contacted: 02/22/20 Time HH Agency Contacted: [phone] Representative spoke with at [place]: Tommi Rumps  Prior Living Arrangements/Services Living arrangements for the past 2 months: [place] Lives with:: Spouse Patient language and need for interpreter reviewed:: Yes Do you feel safe going back to the place where you live?: Yes      Need for Family Participation in Patient Care: Yes (Comment) Care giver support system in place?: Yes (comment) Current home services: DME (cane, walker, wheelchair) Criminal Activity/Legal Involvement Pertinent to Current  Situation/Hospitalization: No - Comment as needed  Activities of Daily Living Home Assistive Devices/Equipment: None ADL Screening (condition at time of admission) Patient's cognitive ability adequate to safely complete daily activities?: Yes Is the patient deaf or have difficulty hearing?: No Does the patient have difficulty seeing, even when wearing glasses/contacts?: No Does the patient have difficulty concentrating, remembering, or making decisions?: No Patient able to express need for assistance with ADLs?: No Does the patient have difficulty dressing or bathing?: No Independently performs ADLs?: Yes (appropriate for developmental age) Does the patient have difficulty walking or climbing stairs?: No Weakness of Legs: Both Weakness of Arms/Hands: None  Permission Sought/Granted                  Emotional Assessment Appearance:: Appears stated age Attitude/Demeanor/Rapport: Engaged Affect (typically observed): Appropriate Orientation: : Oriented to Self, Oriented to Place, Oriented to  Time, Oriented to Situation Alcohol / Substance Use: Not Applicable Psych Involvement: No (comment)  Admission diagnosis:  Hypomagnesemia [E83.42] Weakness [R53.1] Thrombocytopenia (HCC) [D69.6] Failure to thrive in adult [R62.7] Chemotherapy-induced neutropenia ([place]) [D70.1, T45.1X5A] Multiple falls [R29.6] Diarrhea, unspecified type [R19.7] Patient Active Problem List   Diagnosis Date Noted  . Melena 02/19/2020  . Abdominal distention   . Ileus ([place]) 02/12/2020  . Atrial fibrillation with RVR ([place])   . Failure to thrive in adult 02/03/2020  . Fall at home, initial  encounter 02/03/2020  . Severe muscle deconditioning 10/06/2018  . Chronic atrial fibrillation ([place])   . Acute renal failure with acute tubular necrosis superimposed on stage 3 chronic kidney disease ([place])   . Metastatic cancer ([place])   . Palliative care encounter   . Acute respiratory failure with hypoxia ([place])   .  Atypical pneumonia   . CKD (chronic kidney disease), stage III ([place]) 09/08/2018  . Mitral valve insufficiency 08/17/2018  . Drug rash   . Essential hypertension 06/23/2018  . Hyperglycemia 06/23/2018  . Mild hyperlipidemia 06/23/2018  . Mucositis due to chemotherapy 06/22/2018  . AKI (acute kidney injury) ([place]) 06/22/2018  . Pancytopenia ([place]) 06/22/2018  . Staphylococcus aureus bacteremia   . Neutropenic fever ([place]) 06/21/2018  . Pneumonia   . Thrombocytopenia ([place])   . Chronic a-fib ([place])   . Abscess of abdominal cavity ([place]) 12/19/2017  . IDA (iron deficiency anemia) 09/08/2017  . 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 Anorexia 10/17/2016  . Diarrhea 10/17/2016  . LFT elevation 10/17/2016  . Dehydration 10/17/2016  . Fatigue 10/17/2016  . Hypocalcemia 10/17/2016  . Goals of care, counseling/discussion 09/30/2016  . 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 Cancer of dome of urinary bladder ([place]) 09/20/2016  . Postoperative anemia due to acute blood loss 03/04/2015  . Hypokalemia 03/04/2015  . 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/03/2015   PCP:  Shirline Frees, MD Pharmacy:   [facility] [place], [place] [address] [place] [address] Phone: [phone] Fax: [phone]     Social Determinants of Health (SDOH) Interventions Food Insecurity Interventions: Intervention Not Indicated Transportation Interventions: Intervention Not Indicated  Readmission Risk Interventions Readmission Risk Prevention Plan 02/22/2020 09/26/2019 09/08/2018  Transportation Screening Complete Complete Complete  PCP or Specialist Appt within 3-5 Days - Complete -  HRI or [place] - Complete -  Social Work Consult for [place] Planning/Counseling - Complete -  Palliative Care Screening - Not Applicable -  Medication Review  Press photographer) Complete Complete Complete  PCP or Specialist appointment within 3-5 days of discharge Complete - Not Complete  PCP/Specialist Appt Not Complete comments - - not yet ready for d/c  HRI or Home Care Consult Complete - Not Complete  HRI or Home Care Consult Pt Refusal Comments - - no needs identified at this time  SW Recovery Care/Counseling Consult Complete - Not Complete  SW Consult Not Complete Comments - - no needs identified at this time  Palliative Care Screening Not Applicable - Not [place] Not Applicable - Not Applicable  Some recent data might be hidden

## 2020-02-22 NOTE — Telephone Encounter (Signed)
Call placed to patient's wife to notify her that appts for Monday, 02/25/20 will be canceled per [name] and pt is to return as scheduled on Monday, 03/10/20.  Pt.'s wife appreciative of call and has no questions at this time.

## 2020-02-22 NOTE — Progress Notes (Signed)
Physical Therapy Treatment Patient Details Name: [name] Rose MRN: [id number] DOB: 11/19/1950 Today's Date: 02/22/2020    History of Present Illness 69 yo male admitted with FTT, pancytopenia, thrombocytopenia, sepsis, weakness. hx of met prostate ca with bladder mets, Afib, CKD    PT Comments    Pt with excellent increase in amb distance. Is eager to go home when medically ready.    Follow Up Recommendations  Home health PT;Supervision/Assistance - 24 hour     Equipment Recommendations  None recommended by PT    Recommendations for Other Services       Precautions / Restrictions Precautions Precautions: Fall Restrictions Weight Bearing Restrictions: No    Mobility  Bed Mobility Overal bed mobility: Needs Assistance Bed Mobility: Supine to Sit     Supine to sit: Modified independent (Device/Increase time);HOB elevated     General bed mobility comments: Use of rail  Transfers Overall transfer level: Needs assistance Equipment used: Rolling walker (2 wheeled) Transfers: Sit to/from Stand Sit to Stand: Min guard;From elevated surface         General transfer comment: Assist for safety.  Ambulation/Gait Ambulation/Gait assistance: Min guard Gait Distance (Feet): 500 Feet Assistive device: Rolling walker (2 wheeled) Gait Pattern/deviations: Step-through pattern;Decreased stride length;Trunk flexed Gait velocity: decr Gait velocity interpretation: <1.31 ft/sec, indicative of household ambulator General Gait Details: Assist for safety but no physical assist   Stairs             Wheelchair Mobility    Modified Rankin (Stroke Patients Only)       Balance Overall balance assessment: Needs assistance Sitting-balance support: No upper extremity supported Sitting balance-Leahy Scale: Good     Standing balance support: Bilateral upper extremity supported Standing balance-Leahy Scale: Poor Standing balance comment: walker and min guard for  static standing                            Cognition Arousal/Alertness: Awake/alert Behavior During Therapy: WFL for tasks assessed/performed Overall Cognitive Status: Within Functional Limits for tasks assessed                                        Exercises      General Comments General comments (skin integrity, edema, etc.): VSS      Pertinent Vitals/Pain Pain Assessment: No/denies pain    Home Living                      Prior Function            PT Goals (current goals can now be found in the care plan section) Acute Rehab PT Goals Patient Stated Goal: go home Progress towards PT goals: Progressing toward goals    Frequency    Min 3X/week      PT Plan Current plan remains appropriate    Co-evaluation              AM-PAC PT "6 Clicks" Mobility   Outcome Measure  Help needed turning from your back to your side while in a flat bed without using bedrails?: A Little Help needed moving from lying on your back to sitting on the side of a flat bed without using bedrails?: A Little Help needed moving to and from a bed to a chair (including a wheelchair)?: A Little Help needed standing up from a  chair using your arms (e.g., wheelchair or bedside chair)?: A Little Help needed to walk in hospital room?: A Little Help needed climbing 3-5 steps with a railing? : A Little 6 Click Score: 18    End of Session Equipment Utilized During Treatment: Gait belt Activity Tolerance: Patient tolerated treatment well Patient left: in chair;with call bell/phone within reach Nurse Communication: Mobility status PT Visit Diagnosis: Muscle weakness (generalized) (M62.81);History of falling (Z91.81);Repeated falls (R29.6)     Time: 7741-4239 PT Time Calculation (min) (ACUTE ONLY): 26 min  Charges:  $Gait Training: 23-37 mins                     [place] Pager [phone] Office  [place] 02/22/2020, 11:48 AM

## 2020-02-22 NOTE — TOC Transition Note (Signed)
Transition of Care [facility]) - CM/SW Discharge Note   Patient Details  Name: [name] Rose MRN: [id number] Date of Birth: [birth year]  Transition of Care [facility]) CM/SW Contact:  Zenon Mayo, RN Phone Number: 02/22/2020, 3:35 PM   Clinical Narrative:    Patient is from home with spouse, he has w/chair, walker and cane at home.  He has no issues with transportation.  Wife is with him most of time except for when she goes to the grocery store etc.  NCM offered choice, he chose [place], NCM made referral to [facility] , he is able to take referral.  Soc will begin 24 to 48 hrs post dc.      Final next level of care: [place] Barriers to Discharge: Continued Medical Work up   Patient Goals and CMS Choice Patient states their goals for this hospitalization and ongoing recovery are:: to be able to do what he was able to do before CMS Medicare.gov Compare Post Acute Care list provided to:: Patient Choice offered to / list presented to : Patient  Discharge Placement                       Discharge Plan and Services   Discharge Planning Services: CM Consult Post Acute Care Choice: Home Health            DME Agency: NA       HH Arranged: PT, OT HH Agency: [place] Date [place]: 02/22/20 Time HH Agency Contacted: 1528 Representative spoke with at [place]: [place] Determinants of Health (SDOH) Interventions Food Insecurity Interventions: Intervention Not Indicated Transportation Interventions: Intervention Not Indicated   Readmission Risk Interventions Readmission Risk Prevention Plan 02/22/2020 09/26/2019 09/08/2018  Transportation Screening Complete Complete Complete  PCP or Specialist Appt within 3-5 Days - Complete -  HRI or [place] - Complete -  Social Work Consult for [place] Planning/Counseling - Complete -  Palliative Care Screening - Not Applicable -  Medication Review Press photographer)  Complete Complete Complete  PCP or Specialist appointment within 3-5 days of discharge Complete - Not Complete  PCP/Specialist Appt Not Complete comments - - not yet ready for d/c  HRI or Home Care Consult Complete - Not Complete  HRI or Home Care Consult Pt Refusal Comments - - no needs identified at this time  SW Recovery Care/Counseling Consult Complete - Not Complete  SW Consult Not Complete Comments - - no needs identified at this time  Palliative Care Screening Not Applicable - Not [place] Not Applicable - Not Applicable  Some recent data might be hidden

## 2020-02-22 NOTE — Progress Notes (Signed)
[name] Rose is doing well today.  The capsule endoscopy did not show any active bleeding.  His hemoglobin is slowly trending upward.  He walked yesterday.  His atrial fibrillation has good rate control.  I think he is off IV medications now.  There is no abdominal pain.  The diarrhea seems to be thickening up.  He is eating better.  There is no nausea or vomiting.  His labs show white cell count of 6.3.  Hemoglobin 8.  Platelet count 105,000.  His BUN and creatinine are 55 and 6.65.  His urine is quite dilute.  I suspect this is from his kidneys improving.  He may have some postobstructive diuresis.  He has had no fever.  He has had no cough.  He has had no obvious bleeding.  His vital signs are all stable.  His blood pressure is 120/53.  Temperature 97.4.  Pulse is 75.  His cardiac exam shows a irregular rate and irregular rhythm.  The rate is well controlled.  His lungs are clear bilaterally.  Abdominal exam soft.  Bowel sounds are decreased.  There is no distention.  There is no guarding or rebound tenderness.  Extremities shows some mild edema which is chronic.  Skin exam shows some scattered ecchymoses.  Neurological exam is nonfocal.  Hopefully, [name] Rose will be able to go home today.  He certainly is looking forward to going home.  I know he is has received fantastic care from all the staff upon [address].  [name], MD  Psalm 25:17

## 2020-02-22 NOTE — Progress Notes (Signed)
[facility]  PROGRESS NOTE  [name] Rose OXB:[id number] DOB: 04/12/1951 DOA: 02/03/2020 PCP: Shirline Frees, MD Admit date - 02/03/2020   Admitting Physician Harold Hedge, MD  Outpatient Primary MD for the patient is Shirline Frees, MD  LOS - 19 Brief Narrative  [name] a 69 year old male with medical history significant for metastatic bladder cancer on chemotherapy, PAF, CKD stage III who presented to the ED on 10/24 with complaints of generalized weakness and diarrhea for the past 4 to 5 days and was made with working diagnosis of failure to thrive in adult in the setting of recent chemotherapy complicated by diarrhea and severe pancytopenia (WBC of 0.2) adn BP 99/70.  During hospitalization antibiotics were not initially started given patient was afebrile but on night of admission patient became febrile and was started on cefepime for management of sepsis secondary to neutropenia blood cultures are positive for Klebsiella pneumonia.  Hospital course further complicated by AKI on CKD in setting of hypotension likely related to sepsis as well as acute blood loss anemia of unclear etiology.  EGD was unremarkable patient currently awaiting capsule endoscopy results.  Subjective  Walked in halls with PT. Did well A & P   Sepsis secondary to Klebsiella pneumonia in the setting of neutropenia, resolved.  Patient completed course of antibiotics.  Sepsis physiology resolved.  Nonoliguric AKI on CKD stage IV improving.  Believed to be ATN related to hypotension from sepsis physiology as well as low BP related to acute blood loss anemia suspected to be GI bleed.  Patient is still making adequate urine- 6.75 L out in last 24 hours , no edema, creatinine stable at 6.65 No electrolyte abnormalities.  Has mild normal anion gap metabolic acidosis.  -Continue to avoid nephrotoxins -Continue to monitor renal function/urine output, daily BMP -Appreciate nephrology recommendations, continue  sodium bicarb  Acute blood loss anemia, resolved.  Initially suspected upper GI given reported melena.  EGD on 11/9 showed portal hypertensive gastropathy and gastric ulcer.  Currently his hemoglobin remained stable today. No bleeding on capsule endoscopy -Appreciate GI recommendations, continue p.o. PPI BID x 2 months then switch to daily, avoid NSAIDs, ok to resume anticoagulation from GI perspective -Continue to monitor daily CBC (hemoglobin) -resume home Eliquis  Metastatic bladder cancer on chemotherapy with pancytopenia.  White count now normal limits.  Hemoglobin stable to slightly worsened in the setting of GI bleed --Continue monitor CBC -appreciate oncology recommendations  Atrial fibrillation, currently rate controlled.  Had RVR while only on metoprolol during hospital stay, diltiazem gtt weaned off -resume eliquis given stable hgba -Holding home Tikosyn given AKI -currently rate controlled on metoprolol and diltiazem , closely monitor on telemetry   Family Communication  : Wife updated at bedside on11/12  Code Status : Full  Disposition Plan  :  Patient is from home. Anticipated d/c date: 2 to 3 days. Barriers to d/c or necessity for inpatient status: close monitoring of kidney function and hemoglobin.  Consults  : Nephrology, oncology, GI  Procedures  : EGD, 11/9, capsule endoscopy  DVT Prophylaxis  : SCDs  MDM: The below labs and imaging reports were reviewed and summarized above.  Medication management as above.  Lab Results  Component Value Date   PLT 105 (L) 02/22/2020    Diet :  Diet Order            Diet Heart Room service appropriate? Yes; Fluid consistency: Thin  Diet effective now  Inpatient Medications Scheduled Meds: . sodium chloride   Intravenous Once  . sodium chloride   Intravenous Once  . apixaban  5 mg Oral BID  . Chlorhexidine Gluconate Cloth  6 each Topical Daily  . diltiazem  180 mg Oral Daily  . metoprolol tartrate   50 mg Oral BID  . oxybutynin  10 mg Oral QHS  . pantoprazole  40 mg Oral BID  . sodium bicarbonate  1,300 mg Oral BID  . sodium chloride flush  10-40 mL Intracatheter Q12H   Continuous Infusions: . sodium chloride Stopped (02/19/20 1200)   PRN Meds:.acetaminophen **OR** [DISCONTINUED] acetaminophen, diphenhydrAMINE, Gerhardt's butt cream, hydrOXYzine, melatonin, ondansetron **OR** ondansetron (ZOFRAN) IV, sodium chloride flush  Antibiotics  :   Anti-infectives (From admission, onward)   Start     Dose/Rate Route Frequency Ordered Stop   02/15/20 2000  cephALEXin (KEFLEX) capsule 500 mg        500 mg Oral Every 12 hours 02/15/20 0853 02/16/20 2152   02/13/20 0815  cephALEXin (KEFLEX) capsule 500 mg  Status:  Discontinued        500 mg Oral Every 12 hours 02/13/20 0754 02/15/20 0853   02/12/20 0900  ceFAZolin (ANCEF) IVPB 1 g/50 mL premix  Status:  Discontinued        1 g 100 mL/hr over 30 Minutes Intravenous Every 24 hours 02/11/20 1035 02/13/20 0754   02/06/20 1200  ceFAZolin (ANCEF) IVPB 2g/100 mL premix  Status:  Discontinued        2 g 200 mL/hr over 30 Minutes Intravenous Every 12 hours 02/06/20 1103 02/11/20 1035   02/05/20 0600  ceFEPIme (MAXIPIME) 2 g in sodium chloride 0.9 % 100 mL IVPB  Status:  Discontinued        2 g 200 mL/hr over 30 Minutes Intravenous Every 24 hours 02/04/20 0719 02/04/20 1150   02/04/20 1400  cefTRIAXone (ROCEPHIN) 2 g in sodium chloride 0.9 % 100 mL IVPB  Status:  Discontinued        2 g 200 mL/hr over 30 Minutes Intravenous Every 24 hours 02/04/20 1150 02/06/20 1103   02/03/20 1830  ceFEPIme (MAXIPIME) 2 g in sodium chloride 0.9 % 100 mL IVPB  Status:  Discontinued        2 g 200 mL/hr over 30 Minutes Intravenous Every 12 hours 02/03/20 1825 02/04/20 0719       Objective   Vitals:   02/21/20 2129 02/22/20 0336 02/22/20 0339 02/22/20 1122  BP: 134/78  (!) 120/53 118/79  Pulse: 74  75 73  Resp:   15 18  Temp:   (!) 97.4 F (36.3 C) 98.1 F  (36.7 C)  TempSrc:   Oral Oral  SpO2: 100%  97% 98%  Weight:  119.3 kg    Height:        SpO2: 98 % O2 Flow Rate (L/min): 0 L/min  Wt Readings from Last 3 Encounters:  02/22/20 119.3 kg  01/28/20 122.7 kg  01/21/20 122.9 kg     Intake/Output Summary (Last 24 hours) at 02/22/2020 1606 Last data filed at 02/22/2020 1500 Gross per 24 hour  Intake 1730 ml  Output 3476 ml  Net -1746 ml    Physical Exam:     Awake Alert, Oriented X 3, Normal affect No new F.N deficits,  [place].AT, Normal respiratory effort on room air, CTAB RRR,No Gallops,Rubs or new Murmurs,  +ve B.Sounds, Abd Soft, No tenderness, No rebound, guarding or rigidity. No Cyanosis,   Back: Scattered  maculopapular erythematous, pruritic rash     I have personally reviewed the following:   Data Reviewed:  CBC Recent Labs  Lab 02/16/20 0610 02/16/20 1051 02/18/20 0314 02/19/20 0230 02/19/20 1518 02/19/20 2214 02/20/20 0620 02/21/20 0157 02/22/20 0333  WBC 5.8   < > 6.6  --  6.2  --  6.0 6.5 6.3  HGB 6.3*   < > 6.7*   < > 6.7* 7.5* 7.2* 7.5* 8.0*  HCT 19.6*   < > 19.9*   < > 20.6* 23.1* 21.8* 22.8* 25.0*  PLT 58*   < > 80*  --  94*  --  95* 87* 105*  MCV 101.6*   < > 96.1  --  98.1  --  97.8 97.9 99.6  MCH 32.6   < > 32.4  --  31.9  --  32.3 32.2 31.9  MCHC 32.1   < > 33.7  --  32.5  --  33.0 32.9 32.0  RDW 15.4   < > 16.8*  --  16.8*  --  16.7* 16.7* 16.7*  LYMPHSABS 0.6*  --   --   --  0.6*  --  0.6* 0.7 0.8  MONOABS 0.8  --   --   --  1.0  --  1.1* 1.2* 1.2*  EOSABS 0.2  --   --   --  0.1  --  0.1 0.2 0.2  BASOSABS 0.0  --   --   --  0.0  --  0.0 0.0 0.1   < > = values in this interval not displayed.    Chemistries  Recent Labs  Lab 02/18/20 0314 02/19/20 0045 02/20/20 0620 02/21/20 0157 02/22/20 0333  NA 138 137 138 137 135  K 3.4* 3.5 3.5 3.3* 3.2*  CL 110 108 110 108 106  CO2 17* 18* 19* 18* 21*  GLUCOSE 115* 107* 123* 114* 105*  BUN 95* 82* 70* 62* 55*  CREATININE 7.70*  6.96* 6.59* 6.72* 6.65*  CALCIUM 6.9* 7.2* 7.0* 7.1* 7.4*  MG 0.9* 1.4*  --  1.2* 1.6*  AST  --   --  $R'28 24 21  'Eo$ ALT  --   --  $R'7 7 6  'EG$ ALKPHOS  --   --  54 61 61  BILITOT  --   --  0.7 0.5 0.5   ------------------------------------------------------------------------------------------------------------------ No results for input(s): CHOL, HDL, LDLCALC, TRIG, CHOLHDL, LDLDIRECT in the last 72 hours.  Lab Results  Component Value Date   HGBA1C 4.9 10/05/2018   ------------------------------------------------------------------------------------------------------------------ No results for input(s): TSH, T4TOTAL, T3FREE, THYROIDAB in the last 72 hours.  Invalid input(s): FREET3 ------------------------------------------------------------------------------------------------------------------ No results for input(s): VITAMINB12, FOLATE, FERRITIN, TIBC, IRON, RETICCTPCT in the last 72 hours.  Coagulation profile Recent Labs  Lab 02/18/20 0746  INR 1.5*    No results for input(s): DDIMER in the last 72 hours.  Cardiac Enzymes No results for input(s): CKMB, TROPONINI, MYOGLOBIN in the last 168 hours.  Invalid input(s): CK ------------------------------------------------------------------------------------------------------------------ No results found for: BNP  Micro Results No results found for this or any previous visit (from the past 240 hour(s)).  Radiology Reports CT ABDOMEN PELVIS WO CONTRAST  Result Date: 02/07/2020 CLINICAL DATA:  Generalized weakness, diarrhea, decreased appetite, hypotensive, leukopenia, thrombocytopenia, history metastatic bladder cancer, paroxysmal atrial fibrillation, hypertension, stage IV chronic kidney disease, question bowel obstruction EXAM: CT ABDOMEN AND PELVIS WITHOUT CONTRAST TECHNIQUE: Multidetector CT imaging of the abdomen and pelvis was performed following the standard protocol without IV contrast. Sagittal and coronal  MPR images  reconstructed from axial data set. Water as oral contrast. COMPARISON:  01/05/2018 FINDINGS: Lower chest: RIGHT lower lobe bronchiectasis. Bibasilar atelectasis. Small LEFT pleural effusion. Hepatobiliary: Low-attenuation foci within liver question cysts unchanged, largest 15 x 14 mm lateral segment LEFT lobe image 19. Gallbladder unremarkable. Pancreas: Atrophic pancreas without mass Spleen: Normal appearance Adrenals/Urinary Tract: Adrenal glands, kidneys, and ureters normal appearance. Bladder wall appears thickened but bladder is underdistended, question artifact. Stomach/Bowel: Minimal sigmoid diverticulosis without evidence of diverticulitis. Diffuse dilatation of small bowel loops throughout abdomen. Stomach distended by fluid. Fluid within distended thus distal esophagus question reflux. Small bowel loops are but question of bowel wall thickening of the distal ileum is raised. Mild gaseous distension of ascending and transverse colon. Descending and sigmoid colon decompressed without discrete transition zone. Vascular/Lymphatic: Atherosclerotic calcifications aorta and iliac arteries without aneurysm. Coronary arterial calcifications. No adenopathy. Reproductive: Prior prostatectomy. Other: No free air. Small amount of fluid at RIGHT gutter. Minimal stranding of mesentery. Musculoskeletal: Degenerative disc disease changes thoracolumbar spine. IMPRESSION: Diffuse dilatation of small bowel loops throughout abdomen to distal ileum question obstruction. Suspected bowel wall thickening of distal ileum, uncertain etiology but could represent inflammatory bowel disease or infection, tumor considered less likely due to length of involvement; suspect this is responsible for the observed component of small-bowel obstruction. Small amount of nonspecific free fluid. Minimal sigmoid diverticulosis without evidence of diverticulitis. RIGHT lower lobe bronchiectasis with bibasilar atelectasis and small LEFT pleural  effusion. Aortic Atherosclerosis (ICD10-I70.0). Electronically Signed   By: Lavonia Dana M.D.   On: 02/07/2020 13:59   DG Abd 1 View  Result Date: 02/10/2020 CLINICAL DATA:  Small bowel obstruction. EXAM: ABDOMEN - 1 VIEW COMPARISON:  February 09, 2020 FINDINGS: Interval placement of enteric catheter, with side hole at the expected location of the GE junction. Persistent dilation of small bowel loops with mild wall thickening in the mid abdomen, with maximum diameter of 5 cm. IMPRESSION: 1. Interval placement of enteric catheter, with side hole at the expected location of the GE junction. Advancement is recommended. 2. Persistent small bowel obstruction. Electronically Signed   By: Fidela Salisbury M.D.   On: 02/10/2020 17:03   DG Abd 1 View  Result Date: 02/09/2020 CLINICAL DATA:  Ileus. EXAM: ABDOMEN - 1 VIEW COMPARISON:  February 08, 2020 FINDINGS: Persistent dilation of small bowel loops throughout the abdomen measuring up to 5 cm in diameter, with associated mild small bowel wall thickening. The colon is decompressed with residual foci of gas. IMPRESSION: Persistent small bowel dilation throughout the abdomen, with associated mild small bowel wall thickening, suggestive of ongoing small bowel obstruction versus ileus. Supine positioning precludes proper evaluation for free intra-abdominal gas. Electronically Signed   By: Fidela Salisbury M.D.   On: 02/09/2020 13:23   DG Abd 1 View  Result Date: 02/08/2020 CLINICAL DATA:  Follow-up ileus. EXAM: ABDOMEN - 1 VIEW COMPARISON:  Abdomen 02/07/2020.  CT 02/07/2020. FINDINGS: NG tube noted with tip over stomach. Persistent prominently dilated loops of small bowel noted. Colonic dilatation also noted. No free air identified. Degenerative change in scoliosis lumbar spine. Degenerative changes right hip. Total left hip replacement. IMPRESSION: 1.  NG tube noted with tip over stomach. 2. Persistent prominently dilated loops of small bowel again noted.  Colonic dilatation also noted. Similar findings noted on prior exams. Findings consistent with persistent ileus. Continued follow-up exam suggested to demonstrate resolution and to exclude bowel obstruction. Electronically Signed   By: Marcello Moores  Register   On:  02/08/2020 07:35   US RENAL  Result Date: 02/05/2020 CLINICAL DATA:  Acute kidney injury EXAM: RENAL / URINARY TRACT ULTRASOUND COMPLETE COMPARISON:  09/21/2019 FINDINGS: Right Kidney: Renal measurements: 11.3 x 5.5 x 5.7 cm = volume: 185 mL. Echogenicity within normal limits. No mass or hydronephrosis visualized. Left Kidney: Renal measurements: 13.1 x 6 x 5.2 cm = volume: 212 mL. Echogenicity within normal limits. No mass or hydronephrosis visualized. Bladder: Appears normal for degree of bladder distention. Other: Incidental note made of echogenic hepatic parenchyma. IMPRESSION: 1. Normal ultrasound appearance of the kidneys 2. Liver appears slightly echogenic suggesting steatosis Electronically Signed   By: Donavan Foil M.D.   On: 02/05/2020 18:46   DG Abd Portable 1V  Result Date: 02/12/2020 CLINICAL DATA:  Ileus. EXAM: PORTABLE ABDOMEN - 1 VIEW COMPARISON:  02/10/2020 FINDINGS: Gaseous small bowel distention seen on the previous exam has decreased in the interval. A few scattered small bowel loops remain minimally distended up to about 3.4 cm. Gas is visible in the right and left colon. Degenerative changes noted lumbar spine. IMPRESSION: Interval decrease in gaseous bowel distention. Electronically Signed   By: Misty Stanley M.D.   On: 02/12/2020 08:05   DG Abd Portable 1V  Result Date: 02/07/2020 CLINICAL DATA:  69 year old male encounter for NG placement. EXAM: PORTABLE ABDOMEN - 1 VIEW COMPARISON:  Earlier radiograph dated 02/07/2020. FINDINGS: Interval advancement of the enteric tube with side-port and tip in the proximal stomach. Partially visualized central venous line with tip over the cavoatrial junction. IMPRESSION: Enteric tube  with tip in the proximal stomach. Electronically Signed   By: Anner Crete M.D.   On: 02/07/2020 17:43   DG Abd Portable 1V  Result Date: 02/07/2020 CLINICAL DATA:  NG tube placement. EXAM: PORTABLE ABDOMEN - 1 VIEW COMPARISON:  CT 02/07/2020. FINDINGS: PowerPort catheter noted tip over cavoatrial junction. NG tube noted with tip at the gastroesophageal junction. Consider advancement of approximately 15 cm. Dilated loops of small bowel again noted. No free air identified. IMPRESSION: 1. NG tube with tip at the gastroesophageal junction. Consider advancement of approximately 15 cm. 2. Dilated loops of small bowel again noted. Electronically Signed   By: Marcello Moores  Register   On: 02/07/2020 16:22   DG Abd Portable 1V  Result Date: 02/07/2020 CLINICAL DATA:  Abdominal distension. EXAM: PORTABLE ABDOMEN - 1 VIEW COMPARISON:  12/20/2019 nuclear medicine PET. FINDINGS: Dilated small and large bowel loops. No pneumoperitoneum. No air-fluid levels. No suspicious calcific densities. Multilevel spondylosis. Sequela of left hip arthroplasty. IMPRESSION: Dilated small and large bowel loops, concerning for ileus versus obstruction. These results will be called to the ordering clinician or representative by the Radiologist Assistant, and communication documented in the PACS or [employer]. Electronically Signed   By: Primitivo Gauze M.D.   On: 02/07/2020 08:10     Time Spent in minutes  30     Desiree Hane M.D on 02/22/2020 at 4:06 PM  To page go to [email or web address] - password [email or web address]

## 2020-02-22 NOTE — Progress Notes (Signed)
Admit: 02/03/2020 LOS: 33  69 y.o. year-old Afib, on Eliquis, HTN, CKD 4 baseline creat is 1.9- 2.6 from 2021and bladder cancer w/ mets on chemoRx presented on 10/24 w/ gen'd weakness, diarrhea for 5 days, no appetite or fevers.  Consulted for AKI likely secondary to hypotension causing ATN.    Subjective:  . No acute events overnight.  Reports feeling better this morning.  Wondering when can go home.  Appetite good.  Voiding well.  Denies any chest pain, SOB. No recent bloody stools.  11/11 0701 - 11/12 0700 In: 1320 [P.O.:1320] Out: 4826 [Urine:4825; Stool:1]  Filed Weights   02/20/20 0409 02/21/20 0133 02/22/20 0336  Weight: 122.5 kg 122 kg 119.3 kg    Scheduled Meds: . sodium chloride   Intravenous Once  . sodium chloride   Intravenous Once  . Chlorhexidine Gluconate Cloth  6 each Topical Daily  . diltiazem  180 mg Oral Daily  . metoprolol tartrate  50 mg Oral BID  . oxybutynin  10 mg Oral QHS  . pantoprazole  40 mg Oral BID  . sodium bicarbonate  1,300 mg Oral BID  . sodium chloride flush  10-40 mL Intracatheter Q12H   Continuous Infusions: . sodium chloride Stopped (02/19/20 1200)   Current Labs: reviewed Hbg 7.5 Cr 6.72, BUN 62, CO2 18, K 3.3, Mag 1.2   Physical Exam:  Blood pressure (!) 120/53, pulse 75, temperature (!) 97.4 F (36.3 C), temperature source Oral, resp. rate 15, height '6\' 2"'  (1.88 m), weight 119.3 kg, SpO2 97 %.   GEN: sitting on side of bed eating breakfast, in no acute distress ENT: no nasal discharge, mmm EYES: no scleral icterus, eomi CV: normal rate, no murmurs PULM: no iwob, bilateral chest rise ABD: NABS, non-distended SKIN: no rashes or jaundice EXT: no edema, warm and well perfused   A/P  Acute on Chronic Kidney Disease 4 Baseline creat is 1.9- 2.6 from 2021, eGFR 26- 34 ml/min. Creat 2.5 on admitand increase significantly likely due to ATN.   -Creatinine improving -Continue po hydration -Continue to monitor creatinine daily    AFIB Uncontrolled HR overnight. Off Cardizem drip Eliquis on hold for Endoscopy Continue rate control medication per primary team  Metastatic Bladder Cancer Per primary MD  Thrombocytopenia Improving Monitor daily  Electrolyte Imbalances Replete Magnesium and Potassium as needed  Anemia Likely multifactorial.  Has received multiple PRBC transfusions this admissions. -GI following, EGD unremarkable, awaiting results of capsule endoscopy Avoid ESA given malignancy Continue transfusions per primary MD  Carollee Leitz, MD [facility]  02/22/2020, 6:42 AM  Recent Labs  Lab 02/20/20 0620 02/21/20 0157 02/22/20 0333  NA 138 137 135  K 3.5 3.3* 3.2*  CL 110 108 106  CO2 19* 18* 21*  GLUCOSE 123* 114* 105*  BUN 70* 62* 55*  CREATININE 6.59* 6.72* 6.65*  CALCIUM 7.0* 7.1* 7.4*   Recent Labs  Lab 02/20/20 0620 02/21/20 0157 02/22/20 0333  WBC 6.0 6.5 6.3  NEUTROABS 4.2 4.2 4.1  HGB 7.2* 7.5* 8.0*  HCT 21.8* 22.8* 25.0*  MCV 97.8 97.9 99.6  PLT 95* 87* 105*    Plan communicated to the primary team

## 2020-02-23 LAB — COMPREHENSIVE METABOLIC PANEL
ALT: 8 U/L (ref 0–44)
AST: 21 U/L (ref 15–41)
Albumin: 2.1 g/dL — ABNORMAL LOW (ref 3.5–5.0)
Alkaline Phosphatase: 63 U/L (ref 38–126)
Anion gap: 9 (ref 5–15)
BUN: 48 mg/dL — ABNORMAL HIGH (ref 8–23)
CO2: 22 mmol/L (ref 22–32)
Calcium: 8.1 mg/dL — ABNORMAL LOW (ref 8.9–10.3)
Chloride: 104 mmol/L (ref 98–111)
Creatinine, Ser: 6.69 mg/dL — ABNORMAL HIGH (ref 0.61–1.24)
GFR, Estimated: 8 mL/min — ABNORMAL LOW (ref >60)
Glucose, Bld: 110 mg/dL — ABNORMAL HIGH (ref 70–99)
Potassium: 3.4 mmol/L — ABNORMAL LOW (ref 3.5–5.1)
Sodium: 135 mmol/L (ref 135–145)
Total Bilirubin: 0.6 mg/dL (ref 0.3–1.2)
Total Protein: 5.1 g/dL — ABNORMAL LOW (ref 6.5–8.1)

## 2020-02-23 LAB — CBC WITH DIFFERENTIAL/PLATELET
Abs Immature Granulocytes: 0.05 10*3/uL (ref 0.00–0.07)
Basophils Absolute: 0.1 10*3/uL (ref 0.0–0.1)
Basophils Relative: 1 %
Eosinophils Absolute: 0.3 10*3/uL (ref 0.0–0.5)
Eosinophils Relative: 5 %
HCT: 25.4 % — ABNORMAL LOW (ref 39.0–52.0)
Hemoglobin: 8.2 g/dL — ABNORMAL LOW (ref 13.0–17.0)
Immature Granulocytes: 1 %
Lymphocytes Relative: 14 %
Lymphs Abs: 0.9 10*3/uL (ref 0.7–4.0)
MCH: 31.5 pg (ref 26.0–34.0)
MCHC: 32.3 g/dL (ref 30.0–36.0)
MCV: 97.7 fL (ref 80.0–100.0)
Monocytes Absolute: 1.2 10*3/uL — ABNORMAL HIGH (ref 0.1–1.0)
Monocytes Relative: 18 %
Neutro Abs: 3.9 10*3/uL (ref 1.7–7.7)
Neutrophils Relative %: 61 %
Platelets: 122 10*3/uL — ABNORMAL LOW (ref 150–400)
RBC: 2.6 MIL/uL — ABNORMAL LOW (ref 4.22–5.81)
RDW: 16.7 % — ABNORMAL HIGH (ref 11.5–15.5)
WBC: 6.3 10*3/uL (ref 4.0–10.5)
nRBC: 0 % (ref 0.0–0.2)

## 2020-02-23 LAB — MAGNESIUM: Magnesium: 2.5 mg/dL — ABNORMAL HIGH (ref 1.7–2.4)

## 2020-02-23 MED ORDER — POTASSIUM CHLORIDE CRYS ER 20 MEQ PO TBCR
40.0000 meq | EXTENDED_RELEASE_TABLET | Freq: Once | ORAL | Status: AC
  Administered 2020-02-23: 40 meq via ORAL
  Filled 2020-02-23: qty 2

## 2020-02-23 NOTE — Progress Notes (Signed)
[facility]  PROGRESS NOTE  JED KUTCH SFK:[id number] DOB: Dec 12, 1950 DOA: 02/03/2020 PCP: Shirline Frees, MD Admit date - 02/03/2020   Admitting Physician Harold Hedge, MD  Outpatient Primary MD for the patient is Shirline Frees, MD  LOS - 1 Brief Narrative  [name] a 69 year old male with medical history significant for metastatic bladder cancer on chemotherapy, PAF, CKD stage III who presented to the ED on 10/24 with complaints of generalized weakness and diarrhea for the past 4 to 5 days and was made with working diagnosis of failure to thrive in adult in the setting of recent chemotherapy complicated by diarrhea and severe pancytopenia (WBC of 0.2) adn BP 99/70.  During hospitalization antibiotics were not initially started given patient was afebrile but on night of admission patient became febrile and was started on cefepime for management of sepsis secondary to neutropenia blood cultures are positive for Klebsiella pneumonia.  Hospital course further complicated by AKI on CKD in setting of hypotension likely related to sepsis as well as acute blood loss anemia of unclear etiology.  EGD was unremarkable patient currently awaiting capsule endoscopy results.  Subjective  Had BM. No CP. Eating well. Still urinating A & P   Sepsis secondary to Klebsiella pneumonia in the setting of neutropenia, resolved.  Patient completed course of antibiotics.  Sepsis physiology resolved.  Nonoliguric AKI on CKD stage IV stable.  Believed to be ATN related to hypotension from sepsis physiology as well as low BP related to acute blood loss anemia suspected to be GI bleed that improved with IVF.  Peak Creatiine of 9.21, baseline1.8-2.1.  Patient is still making adequate urine- 3.75 L out in last 24 hours , no edema, creatinine stable at 6.69 No electrolyte abnormalities.  Has mild normal anion gap metabolic acidosis.  -Continue to avoid nephrotoxins -Continue to monitor renal  function/urine output, daily BMP -Appreciate nephrology recommendations, continue sodium bicarb  Acute blood loss anemia, resolved.  Initially suspected upper GI given reported melena.  EGD on 11/9 showed portal hypertensive gastropathy and gastric ulcer.  Currently his hemoglobin remained stable today. No bleeding on capsule endoscopy -Appreciate GI recommendations, continue p.o. PPI BID x 2 months then switch to daily, avoid NSAIDs, ok to resume anticoagulation from GI perspective -Continue to monitor daily CBC (hemoglobin) -tolerating home Eliquis  Metastatic bladder cancer on chemotherapy with pancytopenia.  White count now normal limits.  Hemoglobin stable to slightly worsened in the setting of GI bleed --Continue monitor CBC -appreciate oncology recommendations  Atrial fibrillation, currently rate controlled.  Had RVR while only on metoprolol during hospital stay, diltiazem gtt weaned off -tolerating eliquis -Holding home Tikosyn given AKI -currently rate controlled on metoprolol and diltiazem , closely monitor on telemetry   Family Communication  : Wife updated at bedside on11/12  Code Status : Full  Disposition Plan  :  Patient is from home. Anticipated d/c date: 2 to 3 days. Barriers to d/c or necessity for inpatient status: close monitoring of kidney function and hemoglobin.  Consults  : Nephrology, oncology, GI  Procedures  : EGD, 11/9, capsule endoscopy  DVT Prophylaxis  : SCDs  MDM: The below labs and imaging reports were reviewed and summarized above.  Medication management as above.  Lab Results  Component Value Date   PLT 122 (L) 02/23/2020    Diet :  Diet Order            Diet Heart Room service appropriate? Yes; Fluid consistency: Thin  Diet effective  now                  Inpatient Medications Scheduled Meds: . sodium chloride   Intravenous Once  . sodium chloride   Intravenous Once  . apixaban  5 mg Oral BID  . Chlorhexidine Gluconate Cloth  6  each Topical Daily  . diltiazem  180 mg Oral Daily  . metoprolol tartrate  50 mg Oral BID  . oxybutynin  10 mg Oral QHS  . pantoprazole  40 mg Oral BID  . sodium bicarbonate  1,300 mg Oral BID  . sodium chloride flush  10-40 mL Intracatheter Q12H   Continuous Infusions: . sodium chloride Stopped (02/19/20 1200)   PRN Meds:.acetaminophen **OR** [DISCONTINUED] acetaminophen, diphenhydrAMINE, Gerhardt's butt cream, hydrOXYzine, melatonin, ondansetron **OR** ondansetron (ZOFRAN) IV, sodium chloride flush  Antibiotics  :   Anti-infectives (From admission, onward)   Start     Dose/Rate Route Frequency Ordered Stop   02/15/20 2000  cephALEXin (KEFLEX) capsule 500 mg        500 mg Oral Every 12 hours 02/15/20 0853 02/16/20 2152   02/13/20 0815  cephALEXin (KEFLEX) capsule 500 mg  Status:  Discontinued        500 mg Oral Every 12 hours 02/13/20 0754 02/15/20 0853   02/12/20 0900  ceFAZolin (ANCEF) IVPB 1 g/50 mL premix  Status:  Discontinued        1 g 100 mL/hr over 30 Minutes Intravenous Every 24 hours 02/11/20 1035 02/13/20 0754   02/06/20 1200  ceFAZolin (ANCEF) IVPB 2g/100 mL premix  Status:  Discontinued        2 g 200 mL/hr over 30 Minutes Intravenous Every 12 hours 02/06/20 1103 02/11/20 1035   02/05/20 0600  ceFEPIme (MAXIPIME) 2 g in sodium chloride 0.9 % 100 mL IVPB  Status:  Discontinued        2 g 200 mL/hr over 30 Minutes Intravenous Every 24 hours 02/04/20 0719 02/04/20 1150   02/04/20 1400  cefTRIAXone (ROCEPHIN) 2 g in sodium chloride 0.9 % 100 mL IVPB  Status:  Discontinued        2 g 200 mL/hr over 30 Minutes Intravenous Every 24 hours 02/04/20 1150 02/06/20 1103   02/03/20 1830  ceFEPIme (MAXIPIME) 2 g in sodium chloride 0.9 % 100 mL IVPB  Status:  Discontinued        2 g 200 mL/hr over 30 Minutes Intravenous Every 12 hours 02/03/20 1825 02/04/20 0719       Objective   Vitals:   02/22/20 1122 02/22/20 2104 02/23/20 0443 02/23/20 0843  BP: 118/79 126/77 120/66 (!)  101/53  Pulse: 73 74 68 86  Resp: $Remo'18 14 14 16  'xGoWc$ Temp: 98.1 F (36.7 C) 97.7 F (36.5 C) 97.9 F (36.6 C) 97.6 F (36.4 C)  TempSrc: Oral Oral Oral Oral  SpO2: 98% 100% 100% 100%  Weight:   120.2 kg   Height:        SpO2: 100 % O2 Flow Rate (L/min): 0 L/min  Wt Readings from Last 3 Encounters:  02/23/20 120.2 kg  01/28/20 122.7 kg  01/21/20 122.9 kg     Intake/Output Summary (Last 24 hours) at 02/23/2020 1606 Last data filed at 02/23/2020 1349 Gross per 24 hour  Intake 2037 ml  Output 4425 ml  Net -2388 ml    Physical Exam:     Awake Alert, Oriented X 3, Normal affect No new F.N deficits,  [place].AT, Normal respiratory effort on room air, CTAB  RRR,No Gallops,Rubs or new Murmurs, trace peripheral edema +ve B.Sounds, Abd Soft, No tenderness, No rebound, guarding or rigidity. No Cyanosis,   Back: Scattered maculopapular erythematous, pruritic rash     I have personally reviewed the following:   Data Reviewed:  CBC Recent Labs  Lab 02/19/20 1518 02/19/20 1518 02/19/20 2214 02/20/20 0620 02/21/20 0157 02/22/20 0333 02/23/20 0453  WBC 6.2  --   --  6.0 6.5 6.3 6.3  HGB 6.7*   < > 7.5* 7.2* 7.5* 8.0* 8.2*  HCT 20.6*   < > 23.1* 21.8* 22.8* 25.0* 25.4*  PLT 94*  --   --  95* 87* 105* 122*  MCV 98.1  --   --  97.8 97.9 99.6 97.7  MCH 31.9  --   --  32.3 32.2 31.9 31.5  MCHC 32.5  --   --  33.0 32.9 32.0 32.3  RDW 16.8*  --   --  16.7* 16.7* 16.7* 16.7*  LYMPHSABS 0.6*  --   --  0.6* 0.7 0.8 0.9  MONOABS 1.0  --   --  1.1* 1.2* 1.2* 1.2*  EOSABS 0.1  --   --  0.1 0.2 0.2 0.3  BASOSABS 0.0  --   --  0.0 0.0 0.1 0.1   < > = values in this interval not displayed.    Chemistries  Recent Labs  Lab 02/18/20 0314 02/18/20 0314 02/19/20 0045 02/20/20 0620 02/21/20 0157 02/22/20 0333 02/23/20 0453  NA 138   < > 137 138 137 135 135  K 3.4*   < > 3.5 3.5 3.3* 3.2* 3.4*  CL 110   < > 108 110 108 106 104  CO2 17*   < > 18* 19* 18* 21* 22  GLUCOSE 115*    < > 107* 123* 114* 105* 110*  BUN 95*   < > 82* 70* 62* 55* 48*  CREATININE 7.70*   < > 6.96* 6.59* 6.72* 6.65* 6.69*  CALCIUM 6.9*   < > 7.2* 7.0* 7.1* 7.4* 8.1*  MG 0.9*  --  1.4*  --  1.2* 1.6* 2.5*  AST  --   --   --  $R'28 24 21 21  'WP$ ALT  --   --   --  $R'7 7 6 8  'lP$ ALKPHOS  --   --   --  54 61 61 63  BILITOT  --   --   --  0.7 0.5 0.5 0.6   < > = values in this interval not displayed.   ------------------------------------------------------------------------------------------------------------------ No results for input(s): CHOL, HDL, LDLCALC, TRIG, CHOLHDL, LDLDIRECT in the last 72 hours.  Lab Results  Component Value Date   HGBA1C 4.9 10/05/2018   ------------------------------------------------------------------------------------------------------------------ No results for input(s): TSH, T4TOTAL, T3FREE, THYROIDAB in the last 72 hours.  Invalid input(s): FREET3 ------------------------------------------------------------------------------------------------------------------ No results for input(s): VITAMINB12, FOLATE, FERRITIN, TIBC, IRON, RETICCTPCT in the last 72 hours.  Coagulation profile Recent Labs  Lab 02/18/20 0746  INR 1.5*    No results for input(s): DDIMER in the last 72 hours.  Cardiac Enzymes No results for input(s): CKMB, TROPONINI, MYOGLOBIN in the last 168 hours.  Invalid input(s): CK ------------------------------------------------------------------------------------------------------------------ No results found for: BNP  Micro Results No results found for this or any previous visit (from the past 240 hour(s)).  Radiology Reports CT ABDOMEN PELVIS WO CONTRAST  Result Date: 02/07/2020 CLINICAL DATA:  Generalized weakness, diarrhea, decreased appetite, hypotensive, leukopenia, thrombocytopenia, history metastatic bladder cancer, paroxysmal atrial fibrillation, hypertension, stage IV chronic kidney  disease, question bowel obstruction EXAM: CT ABDOMEN  AND PELVIS WITHOUT CONTRAST TECHNIQUE: Multidetector CT imaging of the abdomen and pelvis was performed following the standard protocol without IV contrast. Sagittal and coronal MPR images reconstructed from axial data set. Water as oral contrast. COMPARISON:  01/05/2018 FINDINGS: Lower chest: RIGHT lower lobe bronchiectasis. Bibasilar atelectasis. Small LEFT pleural effusion. Hepatobiliary: Low-attenuation foci within liver question cysts unchanged, largest 15 x 14 mm lateral segment LEFT lobe image 19. Gallbladder unremarkable. Pancreas: Atrophic pancreas without mass Spleen: Normal appearance Adrenals/Urinary Tract: Adrenal glands, kidneys, and ureters normal appearance. Bladder wall appears thickened but bladder is underdistended, question artifact. Stomach/Bowel: Minimal sigmoid diverticulosis without evidence of diverticulitis. Diffuse dilatation of small bowel loops throughout abdomen. Stomach distended by fluid. Fluid within distended thus distal esophagus question reflux. Small bowel loops are but question of bowel wall thickening of the distal ileum is raised. Mild gaseous distension of ascending and transverse colon. Descending and sigmoid colon decompressed without discrete transition zone. Vascular/Lymphatic: Atherosclerotic calcifications aorta and iliac arteries without aneurysm. Coronary arterial calcifications. No adenopathy. Reproductive: Prior prostatectomy. Other: No free air. Small amount of fluid at RIGHT gutter. Minimal stranding of mesentery. Musculoskeletal: Degenerative disc disease changes thoracolumbar spine. IMPRESSION: Diffuse dilatation of small bowel loops throughout abdomen to distal ileum question obstruction. Suspected bowel wall thickening of distal ileum, uncertain etiology but could represent inflammatory bowel disease or infection, tumor considered less likely due to length of involvement; suspect this is responsible for the observed component of small-bowel obstruction. Small  amount of nonspecific free fluid. Minimal sigmoid diverticulosis without evidence of diverticulitis. RIGHT lower lobe bronchiectasis with bibasilar atelectasis and small LEFT pleural effusion. Aortic Atherosclerosis (ICD10-I70.0). Electronically Signed   By: Lavonia Dana M.D.   On: 02/07/2020 13:59   DG Abd 1 View  Result Date: 02/10/2020 CLINICAL DATA:  Small bowel obstruction. EXAM: ABDOMEN - 1 VIEW COMPARISON:  February 09, 2020 FINDINGS: Interval placement of enteric catheter, with side hole at the expected location of the GE junction. Persistent dilation of small bowel loops with mild wall thickening in the mid abdomen, with maximum diameter of 5 cm. IMPRESSION: 1. Interval placement of enteric catheter, with side hole at the expected location of the GE junction. Advancement is recommended. 2. Persistent small bowel obstruction. Electronically Signed   By: Fidela Salisbury M.D.   On: 02/10/2020 17:03   DG Abd 1 View  Result Date: 02/09/2020 CLINICAL DATA:  Ileus. EXAM: ABDOMEN - 1 VIEW COMPARISON:  February 08, 2020 FINDINGS: Persistent dilation of small bowel loops throughout the abdomen measuring up to 5 cm in diameter, with associated mild small bowel wall thickening. The colon is decompressed with residual foci of gas. IMPRESSION: Persistent small bowel dilation throughout the abdomen, with associated mild small bowel wall thickening, suggestive of ongoing small bowel obstruction versus ileus. Supine positioning precludes proper evaluation for free intra-abdominal gas. Electronically Signed   By: Fidela Salisbury M.D.   On: 02/09/2020 13:23   DG Abd 1 View  Result Date: 02/08/2020 CLINICAL DATA:  Follow-up ileus. EXAM: ABDOMEN - 1 VIEW COMPARISON:  Abdomen 02/07/2020.  CT 02/07/2020. FINDINGS: NG tube noted with tip over stomach. Persistent prominently dilated loops of small bowel noted. Colonic dilatation also noted. No free air identified. Degenerative change in scoliosis lumbar spine.  Degenerative changes right hip. Total left hip replacement. IMPRESSION: 1.  NG tube noted with tip over stomach. 2. Persistent prominently dilated loops of small bowel again noted. Colonic dilatation also noted. Similar findings  noted on prior exams. Findings consistent with persistent ileus. Continued follow-up exam suggested to demonstrate resolution and to exclude bowel obstruction. Electronically Signed   By: Marcello Moores  Register   On: 02/08/2020 07:35   US RENAL  Result Date: 02/05/2020 CLINICAL DATA:  Acute kidney injury EXAM: RENAL / URINARY TRACT ULTRASOUND COMPLETE COMPARISON:  09/21/2019 FINDINGS: Right Kidney: Renal measurements: 11.3 x 5.5 x 5.7 cm = volume: 185 mL. Echogenicity within normal limits. No mass or hydronephrosis visualized. Left Kidney: Renal measurements: 13.1 x 6 x 5.2 cm = volume: 212 mL. Echogenicity within normal limits. No mass or hydronephrosis visualized. Bladder: Appears normal for degree of bladder distention. Other: Incidental note made of echogenic hepatic parenchyma. IMPRESSION: 1. Normal ultrasound appearance of the kidneys 2. Liver appears slightly echogenic suggesting steatosis Electronically Signed   By: Donavan Foil M.D.   On: 02/05/2020 18:46   DG Abd Portable 1V  Result Date: 02/12/2020 CLINICAL DATA:  Ileus. EXAM: PORTABLE ABDOMEN - 1 VIEW COMPARISON:  02/10/2020 FINDINGS: Gaseous small bowel distention seen on the previous exam has decreased in the interval. A few scattered small bowel loops remain minimally distended up to about 3.4 cm. Gas is visible in the right and left colon. Degenerative changes noted lumbar spine. IMPRESSION: Interval decrease in gaseous bowel distention. Electronically Signed   By: Misty Stanley M.D.   On: 02/12/2020 08:05   DG Abd Portable 1V  Result Date: 02/07/2020 CLINICAL DATA:  69 year old male encounter for NG placement. EXAM: PORTABLE ABDOMEN - 1 VIEW COMPARISON:  Earlier radiograph dated 02/07/2020. FINDINGS: Interval  advancement of the enteric tube with side-port and tip in the proximal stomach. Partially visualized central venous line with tip over the cavoatrial junction. IMPRESSION: Enteric tube with tip in the proximal stomach. Electronically Signed   By: Anner Crete M.D.   On: 02/07/2020 17:43   DG Abd Portable 1V  Result Date: 02/07/2020 CLINICAL DATA:  NG tube placement. EXAM: PORTABLE ABDOMEN - 1 VIEW COMPARISON:  CT 02/07/2020. FINDINGS: PowerPort catheter noted tip over cavoatrial junction. NG tube noted with tip at the gastroesophageal junction. Consider advancement of approximately 15 cm. Dilated loops of small bowel again noted. No free air identified. IMPRESSION: 1. NG tube with tip at the gastroesophageal junction. Consider advancement of approximately 15 cm. 2. Dilated loops of small bowel again noted. Electronically Signed   By: Marcello Moores  Register   On: 02/07/2020 16:22   DG Abd Portable 1V  Result Date: 02/07/2020 CLINICAL DATA:  Abdominal distension. EXAM: PORTABLE ABDOMEN - 1 VIEW COMPARISON:  12/20/2019 nuclear medicine PET. FINDINGS: Dilated small and large bowel loops. No pneumoperitoneum. No air-fluid levels. No suspicious calcific densities. Multilevel spondylosis. Sequela of left hip arthroplasty. IMPRESSION: Dilated small and large bowel loops, concerning for ileus versus obstruction. These results will be called to the ordering clinician or representative by the Radiologist Assistant, and communication documented in the PACS or [employer]. Electronically Signed   By: Primitivo Gauze M.D.   On: 02/07/2020 08:10     Time Spent in minutes  30     Desiree Hane M.D on 02/23/2020 at 4:06 PM  To page go to [email or web address] - password [email or web address]

## 2020-02-23 NOTE — Progress Notes (Signed)
Admit: 02/03/2020 LOS: 24  69 y.o. year-old Afib, on Eliquis, HTN, CKD 4 baseline creat is 1.9- 2.6 from 2021and bladder cancer w/ mets on chemoRx presented on 10/24 w/ gen'd weakness, diarrhea for 5 days, no appetite or fevers.  Consulted for AKI likely secondary to hypotension causing ATN.    Subjective:  No acute events overnight.  No difficulty voiding and denies any bloody stool. Denies any chest pain, SOB, nausea/vomiting or abdominal pain.   11/12 0701 - 11/13 0700 In: 1630 [P.O.:1630] Out: 3750 [[id number]]  Filed Weights   02/21/20 0133 02/22/20 0336 02/23/20 0443  Weight: 122 kg 119.3 kg 120.2 kg    Scheduled Meds: . sodium chloride   Intravenous Once  . sodium chloride   Intravenous Once  . apixaban  5 mg Oral BID  . Chlorhexidine Gluconate Cloth  6 each Topical Daily  . diltiazem  180 mg Oral Daily  . metoprolol tartrate  50 mg Oral BID  . oxybutynin  10 mg Oral QHS  . pantoprazole  40 mg Oral BID  . sodium bicarbonate  1,300 mg Oral BID  . sodium chloride flush  10-40 mL Intracatheter Q12H   Continuous Infusions: . sodium chloride Stopped (02/19/20 1200)   Current Labs: reviewed Hbg 7.5 Cr 6.72, BUN 62, CO2 18, K 3.3, Mag 1.2   Physical Exam:  Blood pressure 120/66, pulse 68, temperature 97.9 F (36.6 C), temperature source Oral, resp. rate 14, height '6\' 2"'  (1.88 m), weight 120.2 kg, SpO2 100 %.   GEN: sitting on side of bed eating breakfast, in no acute distress ENT: no nasal discharge, mmm EYES: no scleral icterus, eomi CV: normal rate, no murmurs PULM: no iwob, bilateral chest rise ABD: NABS, non-distended SKIN: no rashes or jaundice EXT: no edema, warm and well perfused   A/P  Acute on Chronic Kidney Disease 4 Baseline creat is 1.9- 2.6 from 2021, eGFR 26- 34 ml/min. Creat 2.5 on admitand increase significantly likely due to ATN.   -Creatinine maintaining, BUN improving -Continue po hydration -Continue sodium bicarb -Continue to monitor  creatinine daily   AFIB Uncontrolled HR overnight. Off Cardizem drip Eliquis on hold for Endoscopy Continue rate control medication per primary team  Metastatic Bladder Cancer Per primary MD  Thrombocytopenia Improving Monitor daily  Electrolyte Imbalances Replete Magnesium and Potassium as needed  Anemia Likely multifactorial.  Has received multiple PRBC transfusions this admissions. -GI following, EGD unremarkable, capsule endoscopy shows no GI bleed Avoid ESA given malignancy Continue transfusions per primary MD  Carollee Leitz, MD [facility]  02/23/2020, 7:18 AM  Recent Labs  Lab 02/21/20 0157 02/22/20 0333 02/23/20 0453  NA 137 135 135  K 3.3* 3.2* 3.4*  CL 108 106 104  CO2 18* 21* 22  GLUCOSE 114* 105* 110*  BUN 62* 55* 48*  CREATININE 6.72* 6.65* 6.69*  CALCIUM 7.1* 7.4* 8.1*   Recent Labs  Lab 02/21/20 0157 02/22/20 0333 02/23/20 0453  WBC 6.5 6.3 6.3  NEUTROABS 4.2 4.1 3.9  HGB 7.5* 8.0* 8.2*  HCT 22.8* 25.0* 25.4*  MCV 97.9 99.6 97.7  PLT 87* 105* 122*    Plan communicated to the primary team

## 2020-02-24 LAB — MAGNESIUM: Magnesium: 1.8 mg/dL (ref 1.7–2.4)

## 2020-02-24 LAB — COMPREHENSIVE METABOLIC PANEL
ALT: 8 U/L (ref 0–44)
AST: 21 U/L (ref 15–41)
Albumin: 2 g/dL — ABNORMAL LOW (ref 3.5–5.0)
Alkaline Phosphatase: 65 U/L (ref 38–126)
Anion gap: 10 (ref 5–15)
BUN: 48 mg/dL — ABNORMAL HIGH (ref 8–23)
CO2: 21 mmol/L — ABNORMAL LOW (ref 22–32)
Calcium: 7.9 mg/dL — ABNORMAL LOW (ref 8.9–10.3)
Chloride: 105 mmol/L (ref 98–111)
Creatinine, Ser: 6.63 mg/dL — ABNORMAL HIGH (ref 0.61–1.24)
GFR, Estimated: 8 mL/min — ABNORMAL LOW (ref >60)
Glucose, Bld: 109 mg/dL — ABNORMAL HIGH (ref 70–99)
Potassium: 3.4 mmol/L — ABNORMAL LOW (ref 3.5–5.1)
Sodium: 136 mmol/L (ref 135–145)
Total Bilirubin: 0.5 mg/dL (ref 0.3–1.2)
Total Protein: 4.9 g/dL — ABNORMAL LOW (ref 6.5–8.1)

## 2020-02-24 MED ORDER — POTASSIUM CHLORIDE CRYS ER 20 MEQ PO TBCR
40.0000 meq | EXTENDED_RELEASE_TABLET | Freq: Once | ORAL | Status: AC
  Administered 2020-02-24: 40 meq via ORAL
  Filled 2020-02-24: qty 2

## 2020-02-24 NOTE — Progress Notes (Addendum)
[facility] NEPHROLOGY PROGRESS NOTE  Assessment/ Plan: Pt is a 69 y.o. yo male  with history of A. fib on anticoagulation, hypertension, CKD 4 follows at [facility] (Dr Royce Macadamia) with baseline creatinine level 1.9-2.6, bladder cancer with metastasis on chemotherapy admitted with diarrhea, weakness and GI symptoms and we are consulted for advanced AKI.  #Acute kidney injury on CKD 4 likely ATN due to hypotension/GI bleed: Nonoliguric.  Initially treated with IV fluid with improvement in creatinine level.  Now off of IV fluids for last few days. Increased urine output probably post ATN diuresis.  Both BUN and creatinine level stable today.  Clinically asymptomatic and has no uremic symptom.  No need for dialysis.  Encourage oral intake.  If labs are stable by tomorrow then possibly he can be discharged with outpatient follow-up at [facility].  #Acute blood loss anemia, GI bleed: Hematology is also following. Endoscopy on 11/9 with nonbleeding gastric ulcer, portal hypertensive gastropathy. Capsule endoscopy without active bleeding. On PPI per GI. Hemoglobin is stable.  #Klebsiella pneumonia: Completed antibiotics.  #Metabolic acidosis: Continue sodium bicarbonate.  #Bladder cancer metastasis to the bone: Follow with oncology.  #A. fib with RVR: On metoprolol and Cardizem. Eliquis resumed.  #Hypokalemia: Potassium chloride repleted another dose today. Monitor lab.  We will continue to follow.  Subjective: Seen and examined at bedside.  He is doing well with good oral intake.  Urine output around 5 L.  Eager to go home.  Denies nausea, vomiting, chest pain, shortness of breath. Objective Vital signs in last 24 hours: Vitals:   02/23/20 2038 02/24/20 0453 02/24/20 0746 02/24/20 0948  BP: 124/78 129/68 124/64 116/68  Pulse: 81 77 (!) 49 79  Resp: $Remo'14 16 20 16  'LpXtj$ Temp: 98.1 F (36.7 C) 97.6 F (36.4 C) 97.6 F (36.4 C) 97.7 F (36.5 C)  TempSrc: Oral Oral Oral Oral  SpO2:  99% 97% 100% 100%  Weight:  120 kg    Height:       Weight change: -0.227 kg  Intake/Output Summary (Last 24 hours) at 02/24/2020 1032 Last data filed at 02/24/2020 0908 Gross per 24 hour  Intake 2089 ml  Output 5077 ml  Net -2988 ml       Labs: Basic Metabolic Panel: Recent Labs  Lab 02/22/20 0333 02/23/20 0453 02/24/20 0500  NA 135 135 136  K 3.2* 3.4* 3.4*  CL 106 104 105  CO2 21* 22 21*  GLUCOSE 105* 110* 109*  BUN 55* 48* 48*  CREATININE 6.65* 6.69* 6.63*  CALCIUM 7.4* 8.1* 7.9*   Liver Function Tests: Recent Labs  Lab 02/22/20 0333 02/23/20 0453 02/24/20 0500  AST $Re'21 21 21  'PsC$ ALT $R'6 8 8  'eC$ ALKPHOS 61 63 65  BILITOT 0.5 0.6 0.5  PROT 4.9* 5.1* 4.9*  ALBUMIN 2.0* 2.1* 2.0*   No results for input(s): LIPASE, AMYLASE in the last 168 hours. No results for input(s): AMMONIA in the last 168 hours. CBC: Recent Labs  Lab 02/19/20 1518 02/19/20 2214 02/20/20 0620 02/20/20 0620 02/21/20 0157 02/22/20 0333 02/23/20 0453  WBC 6.2  --  6.0   < > 6.5 6.3 6.3  NEUTROABS 4.4  --  4.2   < > 4.2 4.1 3.9  HGB 6.7*   < > 7.2*   < > 7.5* 8.0* 8.2*  HCT 20.6*   < > 21.8*   < > 22.8* 25.0* 25.4*  MCV 98.1  --  97.8  --  97.9 99.6 97.7  PLT 94*  --  95*   < > 87* 105* 122*   < > = values in this interval not displayed.   Cardiac Enzymes: No results for input(s): CKTOTAL, CKMB, CKMBINDEX, TROPONINI in the last 168 hours. CBG: No results for input(s): GLUCAP in the last 168 hours.  Iron Studies: No results for input(s): IRON, TIBC, TRANSFERRIN, FERRITIN in the last 72 hours. Studies/Results: No results found.  Medications: Infusions: . sodium chloride Stopped (02/19/20 1200)    Scheduled Medications: . sodium chloride   Intravenous Once  . sodium chloride   Intravenous Once  . apixaban  5 mg Oral BID  . Chlorhexidine Gluconate Cloth  6 each Topical Daily  . diltiazem  180 mg Oral Daily  . metoprolol tartrate  50 mg Oral BID  . oxybutynin  10 mg Oral QHS   . pantoprazole  40 mg Oral BID  . sodium bicarbonate  1,300 mg Oral BID  . sodium chloride flush  10-40 mL Intracatheter Q12H    have reviewed scheduled and prn medications.  Physical Exam: General:NAD, comfortable Heart:RRR, s1s2 nl Lungs:clear b/l, no crackle Abdomen:soft, Non-tender, non-distended Extremities:No LE edema Neurology: Alert, awake and following commands  Peregrine Nolt Tanna Furry 02/24/2020,10:32 AM  LOS: 21 days  Pager: [phone]

## 2020-02-24 NOTE — Progress Notes (Signed)
[facility]  PROGRESS NOTE  [name] Rose RCV:[id number] DOB: 1950/08/17 DOA: 02/03/2020 PCP: Shirline Frees, MD Admit date - 02/03/2020   Admitting Physician Harold Hedge, MD  Outpatient Primary MD for the patient is Shirline Frees, MD  LOS - 26 Brief Narrative  [name] a 69 year old male with medical history significant for metastatic bladder cancer on chemotherapy, PAF, CKD stage III who presented to the ED on 10/24 with complaints of generalized weakness and diarrhea for the past 4 to 5 days and was made with working diagnosis of failure to thrive in adult in the setting of recent chemotherapy complicated by diarrhea and severe pancytopenia (WBC of 0.2) adn BP 99/70.  During hospitalization antibiotics were not initially started given patient was afebrile but on night of admission patient became febrile and was started on cefepime for management of sepsis secondary to neutropenia blood cultures are positive for Klebsiella pneumonia.  Hospital course further complicated by AKI on CKD in setting of hypotension likely related to sepsis as well as acute blood loss anemia of unclear etiology.  EGD was unremarkable patient currently awaiting capsule endoscopy results.  Subjective  Still having BMs.  No chest pain.  Still making good urine.  Eating well. A & P   Sepsis secondary to Klebsiella pneumonia in the setting of neutropenia, resolved.  Patient completed course of antibiotics.  Sepsis physiology resolved.  Nonoliguric AKI on CKD stage IV stable.  Believed to be ATN related to hypotension from sepsis physiology as well as low BP related to acute blood loss anemia suspected to be GI bleed that improved with IVF.  Peak Creatiine of 9.21, baseline1.8-2.1.  Patient is still making adequate urine-5 L out in last 24 hours , no edema, creatinine stable at 6.63 No electrolyte abnormalities.  Has mild normal anion gap metabolic acidosis of 21.  -Continue to avoid  nephrotoxins -Continue to monitor renal function/urine output, daily BMP, if creatinine/kidney function remained stable potential discharge in 24 hours per renal -Appreciate nephrology recommendations, continue sodium bicarb  Acute blood loss anemia, resolved.  Initially suspected upper GI given reported melena.  EGD on 11/9 showed portal hypertensive gastropathy and gastric ulcer.  Currently his hemoglobin remained stable today. No bleeding on capsule endoscopy -Appreciate GI recommendations, continue p.o. PPI BID x 2 months then switch to daily, avoid NSAIDs, ok to resume anticoagulation from GI perspective -Continue to monitor daily CBC (hemoglobin) -tolerating home Eliquis  Metastatic bladder cancer on chemotherapy with pancytopenia.  White count now normal limits.  Hemoglobin stable to slightly worsened in the setting of GI bleed --Continue monitor CBC -appreciate oncology recommendations  Atrial fibrillation, currently rate controlled.  Had RVR while only on metoprolol during hospital stay, diltiazem gtt weaned off -tolerating eliquis -Holding home Tikosyn given AKI -currently rate controlled on metoprolol and diltiazem , closely monitor on telemetry   Family Communication  : Wife updated at bedside on11/12  Code Status : Full  Disposition Plan  :  Patient is from home. Anticipated d/c date:  1 to 2 days. Barriers to d/c or necessity for inpatient status: close monitoring of kidney function and hemoglobin.  Consults  : Nephrology, oncology, GI  Procedures  : EGD, 11/9, capsule endoscopy  DVT Prophylaxis  : SCDs  MDM: The below labs and imaging reports were reviewed and summarized above.  Medication management as above.  Lab Results  Component Value Date   PLT 122 (L) 02/23/2020    Diet :  Diet Order  Diet Heart Room service appropriate? Yes; Fluid consistency: Thin  Diet effective now                  Inpatient Medications Scheduled Meds: . sodium  chloride   Intravenous Once  . sodium chloride   Intravenous Once  . apixaban  5 mg Oral BID  . Chlorhexidine Gluconate Cloth  6 each Topical Daily  . diltiazem  180 mg Oral Daily  . metoprolol tartrate  50 mg Oral BID  . oxybutynin  10 mg Oral QHS  . pantoprazole  40 mg Oral BID  . sodium bicarbonate  1,300 mg Oral BID  . sodium chloride flush  10-40 mL Intracatheter Q12H   Continuous Infusions: . sodium chloride Stopped (02/19/20 1200)   PRN Meds:.acetaminophen **OR** [DISCONTINUED] acetaminophen, diphenhydrAMINE, Gerhardt's butt cream, hydrOXYzine, melatonin, ondansetron **OR** ondansetron (ZOFRAN) IV, sodium chloride flush  Antibiotics  :   Anti-infectives (From admission, onward)   Start     Dose/Rate Route Frequency Ordered Stop   02/15/20 2000  cephALEXin (KEFLEX) capsule 500 mg        500 mg Oral Every 12 hours 02/15/20 0853 02/16/20 2152   02/13/20 0815  cephALEXin (KEFLEX) capsule 500 mg  Status:  Discontinued        500 mg Oral Every 12 hours 02/13/20 0754 02/15/20 0853   02/12/20 0900  ceFAZolin (ANCEF) IVPB 1 g/50 mL premix  Status:  Discontinued        1 g 100 mL/hr over 30 Minutes Intravenous Every 24 hours 02/11/20 1035 02/13/20 0754   02/06/20 1200  ceFAZolin (ANCEF) IVPB 2g/100 mL premix  Status:  Discontinued        2 g 200 mL/hr over 30 Minutes Intravenous Every 12 hours 02/06/20 1103 02/11/20 1035   02/05/20 0600  ceFEPIme (MAXIPIME) 2 g in sodium chloride 0.9 % 100 mL IVPB  Status:  Discontinued        2 g 200 mL/hr over 30 Minutes Intravenous Every 24 hours 02/04/20 0719 02/04/20 1150   02/04/20 1400  cefTRIAXone (ROCEPHIN) 2 g in sodium chloride 0.9 % 100 mL IVPB  Status:  Discontinued        2 g 200 mL/hr over 30 Minutes Intravenous Every 24 hours 02/04/20 1150 02/06/20 1103   02/03/20 1830  ceFEPIme (MAXIPIME) 2 g in sodium chloride 0.9 % 100 mL IVPB  Status:  Discontinued        2 g 200 mL/hr over 30 Minutes Intravenous Every 12 hours 02/03/20 1825  02/04/20 0719       Objective   Vitals:   02/24/20 0453 02/24/20 0746 02/24/20 0948 02/24/20 1116  BP: 129/68 124/64 116/68 118/71  Pulse: 77 (!) 49 79 84  Resp: $Remo'16 20 16 18  'LpmJw$ Temp: 97.6 F (36.4 C) 97.6 F (36.4 C) 97.7 F (36.5 C) 98.1 F (36.7 C)  TempSrc: Oral Oral Oral Oral  SpO2: 97% 100% 100% 96%  Weight: 120 kg     Height:        SpO2: 96 % O2 Flow Rate (L/min): 0 L/min  Wt Readings from Last 3 Encounters:  02/24/20 120 kg  01/28/20 122.7 kg  01/21/20 122.9 kg     Intake/Output Summary (Last 24 hours) at 02/24/2020 1152 Last data filed at 02/24/2020 0908 Gross per 24 hour  Intake 2089 ml  Output 4676 ml  Net -2587 ml    Physical Exam:     Awake Alert, Oriented X 3, Normal affect No  new F.N deficits,  [place].AT, Normal respiratory effort on room air, CTAB Irregularly irregular rhythm, normal rate,No Gallops,Rubs or new Murmurs, trace peripheral edema in bilateral lower extremities +ve B.Sounds, Abd Soft, No tenderness, No rebound, guarding or rigidity. No Cyanosis,  Back: Scattered maculopapular erythematous, pruritic rash    I have personally reviewed the following:   Data Reviewed:  CBC Recent Labs  Lab 02/19/20 1518 02/19/20 1518 02/19/20 2214 02/20/20 0620 02/21/20 0157 02/22/20 0333 02/23/20 0453  WBC 6.2  --   --  6.0 6.5 6.3 6.3  HGB 6.7*   < > 7.5* 7.2* 7.5* 8.0* 8.2*  HCT 20.6*   < > 23.1* 21.8* 22.8* 25.0* 25.4*  PLT 94*  --   --  95* 87* 105* 122*  MCV 98.1  --   --  97.8 97.9 99.6 97.7  MCH 31.9  --   --  32.3 32.2 31.9 31.5  MCHC 32.5  --   --  33.0 32.9 32.0 32.3  RDW 16.8*  --   --  16.7* 16.7* 16.7* 16.7*  LYMPHSABS 0.6*  --   --  0.6* 0.7 0.8 0.9  MONOABS 1.0  --   --  1.1* 1.2* 1.2* 1.2*  EOSABS 0.1  --   --  0.1 0.2 0.2 0.3  BASOSABS 0.0  --   --  0.0 0.0 0.1 0.1   < > = values in this interval not displayed.    Chemistries  Recent Labs  Lab 02/19/20 0045 02/19/20 0045 02/20/20 0620 02/21/20 0157  02/22/20 0333 02/23/20 0453 02/24/20 0500  NA 137   < > 138 137 135 135 136  K 3.5   < > 3.5 3.3* 3.2* 3.4* 3.4*  CL 108   < > 110 108 106 104 105  CO2 18*   < > 19* 18* 21* 22 21*  GLUCOSE 107*   < > 123* 114* 105* 110* 109*  BUN 82*   < > 70* 62* 55* 48* 48*  CREATININE 6.96*   < > 6.59* 6.72* 6.65* 6.69* 6.63*  CALCIUM 7.2*   < > 7.0* 7.1* 7.4* 8.1* 7.9*  MG 1.4*  --   --  1.2* 1.6* 2.5* 1.8  AST  --   --  $R'28 24 21 21 21  'gU$ ALT  --   --  $R'7 7 6 8 8  'uT$ ALKPHOS  --   --  54 61 61 63 65  BILITOT  --   --  0.7 0.5 0.5 0.6 0.5   < > = values in this interval not displayed.   ------------------------------------------------------------------------------------------------------------------ No results for input(s): CHOL, HDL, LDLCALC, TRIG, CHOLHDL, LDLDIRECT in the last 72 hours.  Lab Results  Component Value Date   HGBA1C 4.9 10/05/2018   ------------------------------------------------------------------------------------------------------------------ No results for input(s): TSH, T4TOTAL, T3FREE, THYROIDAB in the last 72 hours.  Invalid input(s): FREET3 ------------------------------------------------------------------------------------------------------------------ No results for input(s): VITAMINB12, FOLATE, FERRITIN, TIBC, IRON, RETICCTPCT in the last 72 hours.  Coagulation profile Recent Labs  Lab 02/18/20 0746  INR 1.5*    No results for input(s): DDIMER in the last 72 hours.  Cardiac Enzymes No results for input(s): CKMB, TROPONINI, MYOGLOBIN in the last 168 hours.  Invalid input(s): CK ------------------------------------------------------------------------------------------------------------------ No results found for: BNP  Micro Results No results found for this or any previous visit (from the past 240 hour(s)).  Radiology Reports CT ABDOMEN PELVIS WO CONTRAST  Result Date: 02/07/2020 CLINICAL DATA:  Generalized weakness, diarrhea, decreased appetite,  hypotensive, leukopenia, thrombocytopenia, history  metastatic bladder cancer, paroxysmal atrial fibrillation, hypertension, stage IV chronic kidney disease, question bowel obstruction EXAM: CT ABDOMEN AND PELVIS WITHOUT CONTRAST TECHNIQUE: Multidetector CT imaging of the abdomen and pelvis was performed following the standard protocol without IV contrast. Sagittal and coronal MPR images reconstructed from axial data set. Water as oral contrast. COMPARISON:  01/05/2018 FINDINGS: Lower chest: RIGHT lower lobe bronchiectasis. Bibasilar atelectasis. Small LEFT pleural effusion. Hepatobiliary: Low-attenuation foci within liver question cysts unchanged, largest 15 x 14 mm lateral segment LEFT lobe image 19. Gallbladder unremarkable. Pancreas: Atrophic pancreas without mass Spleen: Normal appearance Adrenals/Urinary Tract: Adrenal glands, kidneys, and ureters normal appearance. Bladder wall appears thickened but bladder is underdistended, question artifact. Stomach/Bowel: Minimal sigmoid diverticulosis without evidence of diverticulitis. Diffuse dilatation of small bowel loops throughout abdomen. Stomach distended by fluid. Fluid within distended thus distal esophagus question reflux. Small bowel loops are but question of bowel wall thickening of the distal ileum is raised. Mild gaseous distension of ascending and transverse colon. Descending and sigmoid colon decompressed without discrete transition zone. Vascular/Lymphatic: Atherosclerotic calcifications aorta and iliac arteries without aneurysm. Coronary arterial calcifications. No adenopathy. Reproductive: Prior prostatectomy. Other: No free air. Small amount of fluid at RIGHT gutter. Minimal stranding of mesentery. Musculoskeletal: Degenerative disc disease changes thoracolumbar spine. IMPRESSION: Diffuse dilatation of small bowel loops throughout abdomen to distal ileum question obstruction. Suspected bowel wall thickening of distal ileum, uncertain etiology but  could represent inflammatory bowel disease or infection, tumor considered less likely due to length of involvement; suspect this is responsible for the observed component of small-bowel obstruction. Small amount of nonspecific free fluid. Minimal sigmoid diverticulosis without evidence of diverticulitis. RIGHT lower lobe bronchiectasis with bibasilar atelectasis and small LEFT pleural effusion. Aortic Atherosclerosis (ICD10-I70.0). Electronically Signed   By: Lavonia Dana M.D.   On: 02/07/2020 13:59   DG Abd 1 View  Result Date: 02/10/2020 CLINICAL DATA:  Small bowel obstruction. EXAM: ABDOMEN - 1 VIEW COMPARISON:  February 09, 2020 FINDINGS: Interval placement of enteric catheter, with side hole at the expected location of the GE junction. Persistent dilation of small bowel loops with mild wall thickening in the mid abdomen, with maximum diameter of 5 cm. IMPRESSION: 1. Interval placement of enteric catheter, with side hole at the expected location of the GE junction. Advancement is recommended. 2. Persistent small bowel obstruction. Electronically Signed   By: Fidela Salisbury M.D.   On: 02/10/2020 17:03   DG Abd 1 View  Result Date: 02/09/2020 CLINICAL DATA:  Ileus. EXAM: ABDOMEN - 1 VIEW COMPARISON:  February 08, 2020 FINDINGS: Persistent dilation of small bowel loops throughout the abdomen measuring up to 5 cm in diameter, with associated mild small bowel wall thickening. The colon is decompressed with residual foci of gas. IMPRESSION: Persistent small bowel dilation throughout the abdomen, with associated mild small bowel wall thickening, suggestive of ongoing small bowel obstruction versus ileus. Supine positioning precludes proper evaluation for free intra-abdominal gas. Electronically Signed   By: Fidela Salisbury M.D.   On: 02/09/2020 13:23   DG Abd 1 View  Result Date: 02/08/2020 CLINICAL DATA:  Follow-up ileus. EXAM: ABDOMEN - 1 VIEW COMPARISON:  Abdomen 02/07/2020.  CT 02/07/2020.  FINDINGS: NG tube noted with tip over stomach. Persistent prominently dilated loops of small bowel noted. Colonic dilatation also noted. No free air identified. Degenerative change in scoliosis lumbar spine. Degenerative changes right hip. Total left hip replacement. IMPRESSION: 1.  NG tube noted with tip over stomach. 2. Persistent prominently dilated loops of  small bowel again noted. Colonic dilatation also noted. Similar findings noted on prior exams. Findings consistent with persistent ileus. Continued follow-up exam suggested to demonstrate resolution and to exclude bowel obstruction. Electronically Signed   By: Marcello Moores  Register   On: 02/08/2020 07:35   US RENAL  Result Date: 02/05/2020 CLINICAL DATA:  Acute kidney injury EXAM: RENAL / URINARY TRACT ULTRASOUND COMPLETE COMPARISON:  09/21/2019 FINDINGS: Right Kidney: Renal measurements: 11.3 x 5.5 x 5.7 cm = volume: 185 mL. Echogenicity within normal limits. No mass or hydronephrosis visualized. Left Kidney: Renal measurements: 13.1 x 6 x 5.2 cm = volume: 212 mL. Echogenicity within normal limits. No mass or hydronephrosis visualized. Bladder: Appears normal for degree of bladder distention. Other: Incidental note made of echogenic hepatic parenchyma. IMPRESSION: 1. Normal ultrasound appearance of the kidneys 2. Liver appears slightly echogenic suggesting steatosis Electronically Signed   By: Donavan Foil M.D.   On: 02/05/2020 18:46   DG Abd Portable 1V  Result Date: 02/12/2020 CLINICAL DATA:  Ileus. EXAM: PORTABLE ABDOMEN - 1 VIEW COMPARISON:  02/10/2020 FINDINGS: Gaseous small bowel distention seen on the previous exam has decreased in the interval. A few scattered small bowel loops remain minimally distended up to about 3.4 cm. Gas is visible in the right and left colon. Degenerative changes noted lumbar spine. IMPRESSION: Interval decrease in gaseous bowel distention. Electronically Signed   By: Misty Stanley M.D.   On: 02/12/2020 08:05   DG Abd  Portable 1V  Result Date: 02/07/2020 CLINICAL DATA:  69 year old male encounter for NG placement. EXAM: PORTABLE ABDOMEN - 1 VIEW COMPARISON:  Earlier radiograph dated 02/07/2020. FINDINGS: Interval advancement of the enteric tube with side-port and tip in the proximal stomach. Partially visualized central venous line with tip over the cavoatrial junction. IMPRESSION: Enteric tube with tip in the proximal stomach. Electronically Signed   By: Anner Crete M.D.   On: 02/07/2020 17:43   DG Abd Portable 1V  Result Date: 02/07/2020 CLINICAL DATA:  NG tube placement. EXAM: PORTABLE ABDOMEN - 1 VIEW COMPARISON:  CT 02/07/2020. FINDINGS: PowerPort catheter noted tip over cavoatrial junction. NG tube noted with tip at the gastroesophageal junction. Consider advancement of approximately 15 cm. Dilated loops of small bowel again noted. No free air identified. IMPRESSION: 1. NG tube with tip at the gastroesophageal junction. Consider advancement of approximately 15 cm. 2. Dilated loops of small bowel again noted. Electronically Signed   By: Marcello Moores  Register   On: 02/07/2020 16:22   DG Abd Portable 1V  Result Date: 02/07/2020 CLINICAL DATA:  Abdominal distension. EXAM: PORTABLE ABDOMEN - 1 VIEW COMPARISON:  12/20/2019 nuclear medicine PET. FINDINGS: Dilated small and large bowel loops. No pneumoperitoneum. No air-fluid levels. No suspicious calcific densities. Multilevel spondylosis. Sequela of left hip arthroplasty. IMPRESSION: Dilated small and large bowel loops, concerning for ileus versus obstruction. These results will be called to the ordering clinician or representative by the Radiologist Assistant, and communication documented in the PACS or [employer]. Electronically Signed   By: Primitivo Gauze M.D.   On: 02/07/2020 08:10     Time Spent in minutes  30     Desiree Hane M.D on 02/24/2020 at 11:52 AM  To page go to [email or web address] - password [facility]

## 2020-02-25 ENCOUNTER — Inpatient Hospital Stay: Payer: PPO

## 2020-02-25 DIAGNOSIS — R296 Repeated falls: Secondary | ICD-10-CM

## 2020-02-25 DIAGNOSIS — C775 Secondary and unspecified malignant neoplasm of intrapelvic lymph nodes: Secondary | ICD-10-CM

## 2020-02-25 LAB — BASIC METABOLIC PANEL
Anion gap: 8 (ref 5–15)
BUN: 45 mg/dL — ABNORMAL HIGH (ref 8–23)
CO2: 23 mmol/L (ref 22–32)
Calcium: 8 mg/dL — ABNORMAL LOW (ref 8.9–10.3)
Chloride: 105 mmol/L (ref 98–111)
Creatinine, Ser: 6.63 mg/dL — ABNORMAL HIGH (ref 0.61–1.24)
GFR, Estimated: 8 mL/min — ABNORMAL LOW (ref >60)
Glucose, Bld: 107 mg/dL — ABNORMAL HIGH (ref 70–99)
Potassium: 3.5 mmol/L (ref 3.5–5.1)
Sodium: 136 mmol/L (ref 135–145)

## 2020-02-25 LAB — MAGNESIUM: Magnesium: 1.4 mg/dL — ABNORMAL LOW (ref 1.7–2.4)

## 2020-02-25 MED ORDER — METOPROLOL TARTRATE 50 MG PO TABS
50.0000 mg | ORAL_TABLET | Freq: Two times a day (BID) | ORAL | 1 refills | Status: DC
Start: 2020-02-25 — End: 2020-03-24

## 2020-02-25 MED ORDER — SODIUM BICARBONATE 650 MG PO TABS
1300.0000 mg | ORAL_TABLET | Freq: Two times a day (BID) | ORAL | 1 refills | Status: DC
Start: 2020-02-25 — End: 2020-07-15

## 2020-02-25 MED ORDER — DILTIAZEM HCL ER COATED BEADS 180 MG PO CP24
180.0000 mg | ORAL_CAPSULE | Freq: Every day | ORAL | 1 refills | Status: DC
Start: 2020-02-26 — End: 2020-04-14

## 2020-02-25 MED ORDER — HEPARIN SOD (PORK) LOCK FLUSH 100 UNIT/ML IV SOLN
500.0000 [IU] | INTRAVENOUS | Status: AC | PRN
  Administered 2020-02-25: 500 [IU]
  Filled 2020-02-25: qty 5

## 2020-02-25 MED ORDER — MAGNESIUM SULFATE 4 GM/100ML IV SOLN
4.0000 g | Freq: Once | INTRAVENOUS | Status: AC
  Administered 2020-02-25: 4 g via INTRAVENOUS
  Filled 2020-02-25: qty 100

## 2020-02-25 MED ORDER — PANTOPRAZOLE SODIUM 40 MG PO TBEC: 40.0000 mg | DELAYED_RELEASE_TABLET | Freq: Two times a day (BID) | ORAL | 1 refills | Status: DC

## 2020-02-25 NOTE — Plan of Care (Signed)
  Problem: Education: Goal: Knowledge of General Education information will improve Description: Including pain rating scale, medication(s)/side effects and non-pharmacologic comfort measures Outcome: Adequate for Discharge   Problem: Health Behavior/Discharge Planning: Goal: Ability to manage health-related needs will improve Outcome: Adequate for Discharge   Problem: Clinical Measurements: Goal: Ability to maintain clinical measurements within normal limits will improve Outcome: Adequate for Discharge Goal: Will remain free from infection Outcome: Adequate for Discharge Goal: Diagnostic test results will improve Outcome: Adequate for Discharge Goal: Respiratory complications will improve Outcome: Adequate for Discharge Goal: Cardiovascular complication will be avoided Outcome: Adequate for Discharge   Problem: Activity: Goal: Risk for activity intolerance will decrease Outcome: Adequate for Discharge   Problem: Nutrition: Goal: Adequate nutrition will be maintained Outcome: Adequate for Discharge   Problem: Coping: Goal: Level of anxiety will decrease Outcome: Adequate for Discharge   Problem: Elimination: Goal: Will not experience complications related to bowel motility Outcome: Adequate for Discharge Goal: Will not experience complications related to urinary retention Outcome: Adequate for Discharge   Problem: Pain Managment: Goal: General experience of comfort will improve Outcome: Adequate for Discharge   Problem: Safety: Goal: Ability to remain free from injury will improve Outcome: Adequate for Discharge   Problem: Skin Integrity: Goal: Risk for impaired skin integrity will decrease Outcome: Adequate for Discharge   Problem: Education: Goal: Knowledge of General Education information will improve Description: Including pain rating scale, medication(s)/side effects and non-pharmacologic comfort measures Outcome: Adequate for Discharge   Problem: Health  Behavior/Discharge Planning: Goal: Ability to manage health-related needs will improve Outcome: Adequate for Discharge   Problem: Clinical Measurements: Goal: Ability to maintain clinical measurements within normal limits will improve Outcome: Adequate for Discharge Goal: Will remain free from infection Outcome: Adequate for Discharge Goal: Diagnostic test results will improve Outcome: Adequate for Discharge Goal: Respiratory complications will improve Outcome: Adequate for Discharge Goal: Cardiovascular complication will be avoided Outcome: Adequate for Discharge   Problem: Activity: Goal: Risk for activity intolerance will decrease Outcome: Adequate for Discharge   Problem: Coping: Goal: Level of anxiety will decrease Outcome: Adequate for Discharge   Problem: Elimination: Goal: Will not experience complications related to bowel motility Outcome: Adequate for Discharge Goal: Will not experience complications related to urinary retention Outcome: Adequate for Discharge   Problem: Safety: Goal: Ability to remain free from injury will improve Outcome: Adequate for Discharge   Problem: Skin Integrity: Goal: Risk for impaired skin integrity will decrease Outcome: Adequate for Discharge

## 2020-02-25 NOTE — Progress Notes (Signed)
Physical Therapy Treatment Patient Details Name: [name] Rose MRN: [id number] DOB: [birth year] Today's Date: 02/25/2020    History of Present Illness 69 yo male admitted with FTT, pancytopenia, thrombocytopenia, sepsis, weakness. hx of met prostate ca with bladder mets, Afib, CKD    PT Comments    Pt making steady progress towards physical therapy goals; demonstrates improved activity tolerance and maintaining ambulation distance. Pt reports being eager for potential discharge home today. Pt ambulating 400 feet with a walker at a supervision level. Education provided regarding activity recommendations and fall prevention. D/c plan remains appropriate.     Follow Up Recommendations  Home health PT;Supervision/Assistance - 24 hour     Equipment Recommendations  None recommended by PT    Recommendations for Other Services       Precautions / Restrictions Precautions Precautions: Fall Restrictions Weight Bearing Restrictions: No    Mobility  Bed Mobility Overal bed mobility: Modified Independent Bed Mobility: Supine to Sit              Transfers Overall transfer level: Needs assistance Equipment used: Rolling walker (2 wheeled) Transfers: Sit to/from Stand Sit to Stand: From elevated surface;Supervision            Ambulation/Gait Ambulation/Gait assistance: Supervision Gait Distance (Feet): 400 Feet Assistive device: Rolling walker (2 wheeled) Gait Pattern/deviations: Step-through pattern;Decreased stride length;Trunk flexed Gait velocity: decreased   General Gait Details: Cues for rolling walker rather than picking up, proximity, upward gaze   Stairs             Wheelchair Mobility    Modified Rankin (Stroke Patients Only)       Balance Overall balance assessment: Needs assistance Sitting-balance support: No upper extremity supported Sitting balance-Leahy Scale: Good     Standing balance support: Bilateral upper extremity  supported Standing balance-Leahy Scale: Poor                              Cognition Arousal/Alertness: Awake/alert Behavior During Therapy: WFL for tasks assessed/performed Overall Cognitive Status: Within Functional Limits for tasks assessed                                        Exercises      General Comments        Pertinent Vitals/Pain Pain Assessment: No/denies pain    Home Living                      Prior Function            PT Goals (current goals can now be found in the care plan section) Acute Rehab PT Goals Patient Stated Goal: go home PT Goal Formulation: With patient Time For Goal Achievement: 03/10/20 Potential to Achieve Goals: Good Progress towards PT goals: Progressing toward goals    Frequency    Min 3X/week      PT Plan Current plan remains appropriate    Co-evaluation              AM-PAC PT "6 Clicks" Mobility   Outcome Measure  Help needed turning from your back to your side while in a flat bed without using bedrails?: None Help needed moving from lying on your back to sitting on the side of a flat bed without using bedrails?: None Help needed moving to and from a bed  to a chair (including a wheelchair)?: None Help needed standing up from a chair using your arms (e.g., wheelchair or bedside chair)?: None Help needed to walk in hospital room?: None Help needed climbing 3-5 steps with a railing? : A Little 6 Click Score: 23    End of Session   Activity Tolerance: Patient tolerated treatment well Patient left: with call bell/phone within reach;in bed Nurse Communication: Mobility status PT Visit Diagnosis: Muscle weakness (generalized) (M62.81);History of falling (Z91.81);Repeated falls (R29.6)     Time: 8403-3533 PT Time Calculation (min) (ACUTE ONLY): 19 min  Charges:  $Gait Training: 8-22 mins                     Wyona Almas, PT, DPT Acute Rehabilitation Services Pager  [phone] Office [phone]    Deno Etienne 02/25/2020, 1:18 PM

## 2020-02-25 NOTE — TOC Transition Note (Deleted)
Transition of Care [facility]) - CM/SW Discharge Note   Patient Details  Name: [name] Rose MRN: [id number] Date of Birth: 1951-01-30  Transition [facility]) CM/SW Contact:  Zenon Mayo, RN Phone Number: 02/25/2020, 2:54 PM   Clinical Narrative:       Final next level of care: [place] Barriers to Discharge: No Barriers Identified   Patient Goals and CMS Choice Patient states their goals for this hospitalization and ongoing recovery are:: to be able to do what he was able to do before CMS Medicare.gov Compare Post Acute Care list provided to:: Patient Choice offered to / list presented to : Patient  Discharge Placement                       Discharge Plan and Services   Discharge Planning Services: CM Consult Post Acute Care Choice: Home Health            DME Agency: NA       HH Arranged: PT, OT, RN [place] Agency: [place] Date [place]: 02/22/20 Time HH Agency Contacted: [phone] Representative spoke with at [place]: [place] Determinants of Health (SDOH) Interventions Food Insecurity Interventions: Intervention Not Indicated Transportation Interventions: Intervention Not Indicated   Readmission Risk Interventions Readmission Risk Prevention Plan 02/22/2020 09/26/2019 09/08/2018  Transportation Screening Complete Complete Complete  PCP or Specialist Appt within 3-5 Days - Complete -  HRI or [place] - Complete -  Social Work Consult for [place] Planning/Counseling - Complete -  Palliative Care Screening - Not Applicable -  Medication Review Press photographer) Complete Complete Complete  PCP or Specialist appointment within 3-5 days of discharge Complete - Not Complete  PCP/Specialist Appt Not Complete comments - - not yet ready for d/c  HRI or Home Care Consult Complete - Not Complete  HRI or Home Care Consult Pt Refusal Comments - - no needs identified at this time  SW Recovery  Care/Counseling Consult Complete - Not Complete  SW Consult Not Complete Comments - - no needs identified at this time  Palliative Care Screening Not Applicable - Not [place] Not Applicable - Not Applicable  Some recent data might be hidden

## 2020-02-25 NOTE — Progress Notes (Signed)
Occupational Therapy Treatment Patient Details Name: [name] Rose MRN: [id number] DOB: Oct 02, 1950 Today's Date: 02/25/2020    History of present illness 69 yo male admitted with FTT, pancytopenia, thrombocytopenia, sepsis, weakness. hx of met prostate ca with bladder mets, Afib, CKD   OT comments  Pt progressing towards established OT goals and is eager to dc home as planned later today. Pt performing dressing (donning shirt, underwear, and pants) at Mod I level with increased time. Educating pt on donning LLE first as it has less AROM. Pt performing functional mobility in hallway with RW and at Mod I level; requiring several standing rest breaks due to fatigue and decreased activity tolerance. Continue to recommend dc to home with HHOT and will continue to follow acutely as admitted.    Follow Up Recommendations  Home health OT    Equipment Recommendations  None recommended by OT    Recommendations for Other Services      Precautions / Restrictions Precautions Precautions: Fall Restrictions Weight Bearing Restrictions: No       Mobility Bed Mobility Overal bed mobility: Modified Independent Bed Mobility: Supine to Sit              Transfers Overall transfer level: Needs assistance Equipment used: Rolling walker (2 wheeled) Transfers: Sit to/from Stand Sit to Stand: Modified independent (Device/Increase time);From elevated surface         General transfer comment: Increased time and use of RW    Balance Overall balance assessment: Needs assistance Sitting-balance support: No upper extremity supported Sitting balance-Leahy Scale: Good     Standing balance support: Bilateral upper extremity supported;No upper extremity supported;During functional activity Standing balance-Leahy Scale: Fair Standing balance comment: able to maintain standing balance without UE support                           ADL either performed or assessed with clinical  judgement   ADL Overall ADL's : Needs assistance/impaired                 Upper Body Dressing : Moderate assistance;Sitting Upper Body Dressing Details (indicate cue type and reason): don shirt Lower Body Dressing: Modified independent;Sit to/from stand Lower Body Dressing Details (indicate cue type and reason): don depends and pants             Functional mobility during ADLs: Modified independent;Rolling walker General ADL Comments: Pt performing ADLs and functional mobility at Mod I level. Requiring increased time for fatigue. HR elevaitng to 103.     Vision       Perception     Praxis      Cognition Arousal/Alertness: Awake/alert Behavior During Therapy: WFL for tasks assessed/performed Overall Cognitive Status: Within Functional Limits for tasks assessed                                          Exercises     Shoulder Instructions       General Comments VSS on RA. HR elevating to 103. Wife present throughout    Pertinent Vitals/ Pain       Pain Assessment: No/denies pain  Home Living  Prior Functioning/Environment              Frequency  Min 2X/week        Progress Toward Goals  OT Goals(current goals can now be found in the care plan section)  Progress towards OT goals: Progressing toward goals  Acute Rehab OT Goals Patient Stated Goal: go home OT Goal Formulation: With patient Time For Goal Achievement: 03/03/20 Potential to Achieve Goals: Good ADL Goals Pt Will Perform Lower Body Bathing: with set-up;sit to/from stand Pt Will Perform Lower Body Dressing: with set-up;sit to/from stand Pt Will Transfer to Toilet: with supervision;ambulating;regular height toilet Pt Will Perform Toileting - Clothing Manipulation and hygiene: with supervision;sit to/from stand Additional ADL Goal #1: Patient will perform 10 min functional activity or exercise activity as  evidence of improving activity tolerance Additional ADL Goal #2: Patient will stand at sink to perform grooming task as evidence of improving activity tolerance  Plan Discharge plan remains appropriate    Co-evaluation                 AM-PAC OT "6 Clicks" Daily Activity     Outcome Measure   Help from another person eating meals?: None Help from another person taking care of personal grooming?: A Little Help from another person toileting, which includes using toliet, bedpan, or urinal?: A Little Help from another person bathing (including washing, rinsing, drying)?: A Little Help from another person to put on and taking off regular upper body clothing?: None Help from another person to put on and taking off regular lower body clothing?: A Little 6 Click Score: 20    End of Session Equipment Utilized During Treatment: Rolling walker  OT Visit Diagnosis: Muscle weakness (generalized) (M62.81)   Activity Tolerance Patient tolerated treatment well   Patient Left with call bell/phone within reach;with nursing/sitter in room;with family/visitor present (Sitting at EOB)   Nurse Communication Mobility status        Time: 1165-7903 OT Time Calculation (min): 28 min  Charges: OT General Charges $OT Visit: 1 Visit OT Treatments $Self Care/Home Management : 8-22 mins $Therapeutic Activity: 8-22 mins  [name] Rose MSOT, OTR/L Acute Rehab Pager: [phone] Office: [place] 02/25/2020, 3:39 PM

## 2020-02-25 NOTE — Progress Notes (Signed)
D/C instructions given and reviewed. Questions asked and answered but encouraged to call with any further concerns. Tele and PIV removed, tolerated well.

## 2020-02-25 NOTE — TOC Transition Note (Addendum)
Transition of Care [facility]) - CM/SW Discharge Note   Patient Details  Name: [name] Rose MRN: [id number] Date of Birth: [birth year]  Transition of Care [facility]) CM/SW Contact:  Zenon Mayo, RN Phone Number: 02/25/2020, 2:55 PM   Clinical Narrative:    Patient is for dc today, notified Cory with [place].     Final next level of care: [place] Barriers to Discharge: No Barriers Identified   Patient Goals and CMS Choice Patient states their goals for this hospitalization and ongoing recovery are:: to be able to do what he was able to do before CMS Medicare.gov Compare Post Acute Care list provided to:: Patient Choice offered to / list presented to : Patient  Discharge Placement                       Discharge Plan and Services   Discharge Planning Services: CM Consult Post Acute Care Choice: Home Health            DME Agency: NA       HH Arranged: PT, OT, RN [place] Agency: [place] Date [place]: 02/22/20 Time HH Agency Contacted: 1528 Representative spoke with at [place]: [place] Determinants of Health (SDOH) Interventions Food Insecurity Interventions: Intervention Not Indicated Transportation Interventions: Intervention Not Indicated   Readmission Risk Interventions Readmission Risk Prevention Plan 02/22/2020 09/26/2019 09/08/2018  Transportation Screening Complete Complete Complete  PCP or Specialist Appt within 3-5 Days - Complete -  HRI or [place] - Complete -  Social Work Consult for [place] Planning/Counseling - Complete -  Palliative Care Screening - Not Applicable -  Medication Review Press photographer) Complete Complete Complete  PCP or Specialist appointment within 3-5 days of discharge Complete - Not Complete  PCP/Specialist Appt Not Complete comments - - not yet ready for d/c  HRI or Home Care Consult Complete - Not Complete  HRI or Home Care Consult Pt Refusal Comments - -  no needs identified at this time  SW Recovery Care/Counseling Consult Complete - Not Complete  SW Consult Not Complete Comments - - no needs identified at this time  Palliative Care Screening Not Applicable - Not [place] Not Applicable - Not Applicable  Some recent data might be hidden

## 2020-02-25 NOTE — Progress Notes (Signed)
[facility] ROUNDING NOTE   Subjective:   Brief history This is a 69 year old gentleman with a history of atrial fibrillation on chronic anticoagulation therapy hypertension chronic kidney disease stage IV followed at [facility] with a baseline serum creatinine 1.9 to 2.6 mg/dL.  History of bladder cancer with metastases on chemotherapy admitted with diarrhea weakness.  Acute kidney injury thought to be secondary to acute tubular necrosis from hypotension and GI bleed nonoliguric.  No indications for dialysis.  Blood pressure 113/59 pulse 69 temperature 97.7 O2 sats 99%  Sodium 136 potassium 3.5 chloride 105 CO2 23 BUN 45 creatinine 6.63 glucose 107 calcium 8 magnesium 1.4 hemoglobin 8.2    Objective:  Vital signs in last 24 hours:  Temp:  [97.6 F (36.4 C)-98.9 F (37.2 C)] 97.7 F (36.5 C) (11/15 0437) Pulse Rate:  [49-84] 73 (11/15 0437) Resp:  [16-20] 17 (11/15 0437) BP: (92-130)/(50-90) 113/59 (11/15 0437) SpO2:  [96 %-100 %] 99 % (11/15 0437) Weight:  [117.2 kg] 117.2 kg (11/15 0437)  Weight change: -2.812 kg Filed Weights   02/23/20 0443 02/24/20 0453 02/25/20 0437  Weight: 120.2 kg 120 kg 117.2 kg    Intake/Output: I/O last 3 completed shifts: In: 3769 [P.O.:3769] Out: 6303 [Urine:6300; Stool:3]   Intake/Output this shift:  Total I/O In: 480 [P.O.:480] Out: 2100 [Urine:2100]  CVS- RRR RS- CTA ABD- BS present soft non-distended EXT- no edema   Basic Metabolic Panel: Recent Labs  Lab 02/21/20 0157 02/21/20 0157 02/22/20 0333 02/22/20 0333 02/23/20 0453 02/24/20 0500 02/25/20 0530  NA 137  --  135  --  135 136 136  K 3.3*  --  3.2*  --  3.4* 3.4* 3.5  CL 108  --  106  --  104 105 105  CO2 18*  --  21*  --  22 21* 23  GLUCOSE 114*  --  105*  --  110* 109* 107*  BUN 62*  --  55*  --  48* 48* 45*  CREATININE 6.72*  --  6.65*  --  6.69* 6.63* 6.63*  CALCIUM 7.1*   < > 7.4*   < > 8.1* 7.9* 8.0*  MG 1.2*  --  1.6*  --   2.5* 1.8 1.4*   < > = values in this interval not displayed.    Liver Function Tests: Recent Labs  Lab 02/20/20 0620 02/21/20 0157 02/22/20 0333 02/23/20 0453 02/24/20 0500  AST $Re'28 24 21 21 21  'YNC$ ALT $R'7 7 6 8 8  'Cq$ ALKPHOS 54 61 61 63 65  BILITOT 0.7 0.5 0.5 0.6 0.5  PROT 4.5* 4.7* 4.9* 5.1* 4.9*  ALBUMIN 1.9* 2.0* 2.0* 2.1* 2.0*   No results for input(s): LIPASE, AMYLASE in the last 168 hours. No results for input(s): AMMONIA in the last 168 hours.  CBC: Recent Labs  Lab 02/19/20 1518 02/19/20 1518 02/19/20 2214 02/20/20 0620 02/21/20 0157 02/22/20 0333 02/23/20 0453  WBC 6.2  --   --  6.0 6.5 6.3 6.3  NEUTROABS 4.4  --   --  4.2 4.2 4.1 3.9  HGB 6.7*   < > 7.5* 7.2* 7.5* 8.0* 8.2*  HCT 20.6*   < > 23.1* 21.8* 22.8* 25.0* 25.4*  MCV 98.1  --   --  97.8 97.9 99.6 97.7  PLT 94*  --   --  95* 87* 105* 122*   < > = values in this interval not displayed.    Cardiac Enzymes: No results for input(s): CKTOTAL, CKMB,  CKMBINDEX, TROPONINI in the last 168 hours.  BNP: Invalid input(s): POCBNP  CBG: No results for input(s): GLUCAP in the last 168 hours.  Microbiology: Results for orders placed or performed during the hospital encounter of 02/03/20  Respiratory Panel by RT PCR (Flu A&B, Covid) - Nasopharyngeal Swab     Status: None   Collection Time: 02/03/20 11:28 AM   Specimen: Nasopharyngeal Swab  Result Value Ref Range Status   SARS Coronavirus 2 by RT PCR NEGATIVE NEGATIVE Final    Comment: (NOTE) SARS-CoV-2 target nucleic acids are NOT DETECTED.  The SARS-CoV-2 RNA is generally detectable in upper respiratoy specimens during the acute phase of infection. The lowest concentration of SARS-CoV-2 viral copies this assay can detect is 131 copies/mL. A negative result does not preclude SARS-Cov-2 infection and should not be used as the sole basis for treatment or other patient management decisions. A negative result may occur with  improper specimen  collection/handling, submission of specimen other than nasopharyngeal swab, presence of viral mutation(s) within the areas targeted by this assay, and inadequate number of viral copies (<131 copies/mL). A negative result must be combined with clinical observations, patient history, and epidemiological information. The expected result is Negative.  Fact Sheet for Patients:  PinkCheek.be  Fact Sheet for Healthcare Providers:  GravelBags.it  This test is no t yet approved or cleared by the [place] FDA and  has been authorized for detection and/or diagnosis of SARS-CoV-2 by FDA under an Emergency Use Authorization (EUA). This EUA will remain  in effect (meaning this test can be used) for the duration of the COVID-19 declaration under Section 564(b)(1) of the Act, 21 U.S.C. section 360bbb-3(b)(1), unless the authorization is terminated or revoked sooner.     Influenza A by PCR NEGATIVE NEGATIVE Final   Influenza B by PCR NEGATIVE NEGATIVE Final    Comment: (NOTE) The Xpert Xpress SARS-CoV-2/FLU/RSV assay is intended as an aid in  the diagnosis of influenza from Nasopharyngeal swab specimens and  should not be used as a sole basis for treatment. Nasal washings and  aspirates are unacceptable for Xpert Xpress SARS-CoV-2/FLU/RSV  testing.  Fact Sheet for Patients: PinkCheek.be  Fact Sheet for Healthcare Providers: GravelBags.it  This test is not yet approved or cleared by the [place] FDA and  has been authorized for detection and/or diagnosis of SARS-CoV-2 by  FDA under an Emergency Use Authorization (EUA). This EUA will remain  in effect (meaning this test can be used) for the duration of the  Covid-19 declaration under Section 564(b)(1) of the Act, 21  U.S.C. section 360bbb-3(b)(1), unless the authorization is  terminated or revoked. Performed at [facility], [place] [address] [place], [place] [address]   Culture, blood (x 2)     Status: Abnormal   Collection Time: 02/03/20  6:50 PM   Specimen: BLOOD RIGHT HAND  Result Value Ref Range Status   Specimen Description   Final    BLOOD RIGHT HAND Performed at [facility] [address] [place], [place] [address]    Special Requests   Final    BOTTLES DRAWN AEROBIC ONLY Blood Culture results may not be optimal due to an inadequate volume of blood received in culture bottles Performed at [facility] [address] [place], [place] [address]    Culture  Setup Time   Final    GRAM NEGATIVE RODS AEROBIC BOTTLE ONLY CRITICAL VALUE NOTED.  VALUE IS CONSISTENT WITH PREVIOUSLY REPORTED AND  CALLED VALUE.    Culture (A)  Final    KLEBSIELLA PNEUMONIAE SUSCEPTIBILITIES PERFORMED ON PREVIOUS CULTURE WITHIN THE LAST 5 DAYS. Performed at [facility], [place] [address] [place], [place] [address]    Report Status 02/06/2020 FINAL  Final  Culture, blood (x 2)     Status: Abnormal   Collection Time: 02/03/20  6:50 PM   Specimen: BLOOD  Result Value Ref Range Status   Specimen Description   Final    BLOOD RIGHT ANTECUBITAL Performed at [facility], [place] [address] [place], [place] [address]    Special Requests   Final    BOTTLES DRAWN AEROBIC ONLY Blood Culture results may not be optimal due to an excessive volume of blood received in culture bottles Performed at [facility] [address] [place], [place] [address]    Culture  Setup Time   Final    GRAM NEGATIVE RODS AEROBIC BOTTLE ONLY CRITICAL RESULT CALLED TO, READ BACK BY AND VERIFIED WITH: T,GREEN PHARMD $RemoveBefore'@1113'[email or web address]$  02/04/20 EB Performed at [facility] [address], [place],  [address]    Culture KLEBSIELLA PNEUMONIAE (A)  Final   Report Status 02/06/2020 FINAL  Final   Organism ID, Bacteria KLEBSIELLA PNEUMONIAE  Final       Susceptibility   Klebsiella pneumoniae - MIC*    AMPICILLIN >=32 RESISTANT Resistant     CEFAZOLIN <=4 SENSITIVE Sensitive     CEFEPIME <=0.12 SENSITIVE Sensitive     CEFTAZIDIME <=1 SENSITIVE Sensitive     CEFTRIAXONE <=0.25 SENSITIVE Sensitive     CIPROFLOXACIN <=0.25 SENSITIVE Sensitive     GENTAMICIN <=1 SENSITIVE Sensitive     IMIPENEM <=0.25 SENSITIVE Sensitive     TRIMETH/SULFA <=20 SENSITIVE Sensitive     AMPICILLIN/SULBACTAM 4 SENSITIVE Sensitive     PIP/TAZO <=4 SENSITIVE Sensitive     * KLEBSIELLA PNEUMONIAE  Blood Culture ID Panel (Reflexed)     Status: Abnormal   Collection Time: 02/03/20  6:50 PM  Result Value Ref Range Status   Enterococcus faecalis NOT DETECTED NOT DETECTED Final   Enterococcus Faecium NOT DETECTED NOT DETECTED Final   Listeria monocytogenes NOT DETECTED NOT DETECTED Final   Staphylococcus species NOT DETECTED NOT DETECTED Final   Staphylococcus aureus (BCID) NOT DETECTED NOT DETECTED Final   Staphylococcus epidermidis NOT DETECTED NOT DETECTED Final   Staphylococcus lugdunensis NOT DETECTED NOT DETECTED Final   Streptococcus species NOT DETECTED NOT DETECTED Final   Streptococcus agalactiae NOT DETECTED NOT DETECTED Final   Streptococcus pneumoniae NOT DETECTED NOT DETECTED Final   Streptococcus pyogenes NOT DETECTED NOT DETECTED Final   A.calcoaceticus-baumannii NOT DETECTED NOT DETECTED Final   Bacteroides fragilis NOT DETECTED NOT DETECTED Final   Enterobacterales DETECTED (A) NOT DETECTED Final    Comment: Enterobacterales represent a large order of gram negative bacteria, not a single organism. CRITICAL RESULT CALLED TO, READ BACK BY AND VERIFIED WITH: T,GREEN PHARMD $RemoveBefore'@1113'[email or web address]$  02/04/20 EB    Enterobacter cloacae complex NOT DETECTED NOT DETECTED Final   Escherichia coli NOT DETECTED NOT DETECTED Final   Klebsiella aerogenes NOT DETECTED NOT DETECTED Final   Klebsiella oxytoca NOT DETECTED NOT DETECTED Final   Klebsiella pneumoniae DETECTED (A)  NOT DETECTED Final    Comment: CRITICAL RESULT CALLED TO, READ BACK BY AND VERIFIED WITH: T,GREEN PHARMD $RemoveBefore'@1113'[email or web address]$  02/04/20 EB    Proteus species NOT DETECTED NOT DETECTED Final   Salmonella species NOT DETECTED NOT DETECTED Final   Serratia marcescens  NOT DETECTED NOT DETECTED Final   Haemophilus influenzae NOT DETECTED NOT DETECTED Final   Neisseria meningitidis NOT DETECTED NOT DETECTED Final   Pseudomonas aeruginosa NOT DETECTED NOT DETECTED Final   Stenotrophomonas maltophilia NOT DETECTED NOT DETECTED Final   Candida albicans NOT DETECTED NOT DETECTED Final   Candida auris NOT DETECTED NOT DETECTED Final   Candida glabrata NOT DETECTED NOT DETECTED Final   Candida krusei NOT DETECTED NOT DETECTED Final   Candida parapsilosis NOT DETECTED NOT DETECTED Final   Candida tropicalis NOT DETECTED NOT DETECTED Final   Cryptococcus neoformans/gattii NOT DETECTED NOT DETECTED Final   CTX-M ESBL NOT DETECTED NOT DETECTED Final   Carbapenem resistance IMP NOT DETECTED NOT DETECTED Final   Carbapenem resistance KPC NOT DETECTED NOT DETECTED Final   Carbapenem resistance NDM NOT DETECTED NOT DETECTED Final   Carbapenem resist OXA 48 LIKE NOT DETECTED NOT DETECTED Final   Carbapenem resistance VIM NOT DETECTED NOT DETECTED Final    Comment: Performed at [facility], [address] [place], [place] [address]  C Difficile Quick Screen w PCR reflex     Status: None   Collection Time: 02/04/20  5:07 PM   Specimen: STOOL  Result Value Ref Range Status   C Diff antigen NEGATIVE NEGATIVE Final   C Diff toxin NEGATIVE NEGATIVE Final   C Diff interpretation No C. difficile detected.  Final    Comment: Performed at [facility], [place] [address], [place], [place] [address]  Gastrointestinal Panel by PCR , Stool     Status: None   Collection Time: 02/06/20 10:54 AM   Specimen: Stool  Result Value Ref Range Status   Campylobacter species NOT DETECTED NOT DETECTED Final    Plesimonas shigelloides NOT DETECTED NOT DETECTED Final   Salmonella species NOT DETECTED NOT DETECTED Final   Yersinia enterocolitica NOT DETECTED NOT DETECTED Final   Vibrio species NOT DETECTED NOT DETECTED Final   Vibrio cholerae NOT DETECTED NOT DETECTED Final   Enteroaggregative E coli (EAEC) NOT DETECTED NOT DETECTED Final   Enteropathogenic E coli (EPEC) NOT DETECTED NOT DETECTED Final   Enterotoxigenic E coli (ETEC) NOT DETECTED NOT DETECTED Final   Shiga like toxin producing E coli (STEC) NOT DETECTED NOT DETECTED Final   Shigella/Enteroinvasive E coli (EIEC) NOT DETECTED NOT DETECTED Final   Cryptosporidium NOT DETECTED NOT DETECTED Final   Cyclospora cayetanensis NOT DETECTED NOT DETECTED Final   Entamoeba histolytica NOT DETECTED NOT DETECTED Final   Giardia lamblia NOT DETECTED NOT DETECTED Final   Adenovirus F40/41 NOT DETECTED NOT DETECTED Final   Astrovirus NOT DETECTED NOT DETECTED Final   Norovirus GI/GII NOT DETECTED NOT DETECTED Final   Rotavirus A NOT DETECTED NOT DETECTED Final   Sapovirus (I, II, IV, and V) NOT DETECTED NOT DETECTED Final    Comment: Performed at [facility], [place] [place], [place] [address]  Culture, blood (Routine X 2) w Reflex to ID Panel     Status: None   Collection Time: 02/09/20 10:48 AM   Specimen: BLOOD  Result Value Ref Range Status   Specimen Description   Final    BLOOD LEFT ARM Performed at [facility], [place] [address] [place], [place] [address]    Special Requests   Final    BOTTLES DRAWN AEROBIC AND ANAEROBIC Blood Culture adequate volume Performed at [facility] [address] [place], [place] [address]    Culture   Final  NO GROWTH 5 DAYS Performed at [facility], [place] [address], [place], [place] [address]    Report Status 02/14/2020 FINAL  Final  Culture, blood (Routine X 2) w Reflex to ID Panel     Status: None   Collection Time: 02/09/20 10:50 AM    Specimen: BLOOD  Result Value Ref Range Status   Specimen Description   Final    BLOOD LEFT ARM Performed at [facility] [address], [place], [place] [address]    Special Requests   Final    BOTTLES DRAWN AEROBIC AND ANAEROBIC Blood Culture adequate volume Performed at [facility] [address], [place], [place] [address]    Culture   Final    NO GROWTH 5 DAYS Performed at [facility], [place] [address], [place], [place] [address]    Report Status 02/14/2020 FINAL  Final    Coagulation Studies: No results for input(s): LABPROT, INR in the last 72 hours.  Urinalysis: No results for input(s): COLORURINE, LABSPEC, PHURINE, GLUCOSEU, HGBUR, BILIRUBINUR, KETONESUR, PROTEINUR, UROBILINOGEN, NITRITE, LEUKOCYTESUR in the last 72 hours.  Invalid input(s): APPERANCEUR    Imaging: No results found.   Medications:   . sodium chloride Stopped (02/19/20 1200)   . sodium chloride   Intravenous Once  . sodium chloride   Intravenous Once  . apixaban  5 mg Oral BID  . Chlorhexidine Gluconate Cloth  6 each Topical Daily  . diltiazem  180 mg Oral Daily  . metoprolol tartrate  50 mg Oral BID  . oxybutynin  10 mg Oral QHS  . pantoprazole  40 mg Oral BID  . sodium bicarbonate  1,300 mg Oral BID  . sodium chloride flush  10-40 mL Intracatheter Q12H   acetaminophen **OR** [DISCONTINUED] acetaminophen, diphenhydrAMINE, Gerhardt's butt cream, hydrOXYzine, melatonin, ondansetron **OR** ondansetron (ZOFRAN) IV, sodium chloride flush  Assessment/ Plan:  1.Acute kidney injury on CKD 4 likely ATN due to hypotension/GI bleed: Nonoliguric.  Initially treated with IV fluid with improvement in creatinine level. Now off of IV fluids for last few days. Increased urine output probably post ATN diuresis.  Both BUN and creatinine level stable today.  Clinically asymptomatic and has no uremic symptom.  No need for dialysis.  Encourage oral intake.  He  can be discharged from a renal standpoint with follow-up [facility] Dr. Royce Macadamia.  I would recommend labs drawn towards the end of the week Wednesday or Thursday.  I would recommend follow-up in 1 to 2 weeks  2.Acute blood loss anemia, GI bleed: Hematology is also following. Endoscopy on 11/9 with nonbleeding gastric ulcer, portal hypertensive gastropathy. Capsule endoscopy without active bleeding. On PPI per GI. Hemoglobin is stable.  3.Klebsiella pneumonia: Completed antibiotics.  4.Metabolic acidosis: Continue sodium bicarbonate.  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  6.A. fib with RVR: On metoprolol and Cardizem. Eliquis resumed.  6.Hypokalemia: Potassium chloride repleted another dose today. Monitor lab.      LOS: [place] $RemoveBef'@TODAY'mWSHhfvLdA$ $Remo'@6'jsNvV$ :40 AM

## 2020-02-25 NOTE — Discharge Instructions (Addendum)
You should take Protonix 40 mg. twice a day.for next 2 months and then switch to Protonix 40 mg once a day. -Avoid NSAIDs -Follow-up with Dr. Michail Sermon in 4 to 6 weeks after discharge.    You have a [employer] on 11/24 at 40 with Velva Harman,   You need to get labwork on  11/17 at [facility]  Please do not take your tikosyn. This was held during your hospital stay      Information on my medicine - ELIQUIS (apixaban)  This medication education was reviewed with me or my healthcare representative as part of my discharge preparation.    Why was Eliquis prescribed for you? Eliquis was prescribed for you to reduce the risk of a blood clot forming that can cause a stroke if you have a medical condition called atrial fibrillation (a type of irregular heartbeat).  What do You need to know about Eliquis ? Take your Eliquis TWICE DAILY - one tablet in the morning and one tablet in the evening with or without food. If you have difficulty swallowing the tablet whole please discuss with your pharmacist how to take the medication safely.  Take Eliquis exactly as prescribed by your doctor and DO NOT stop taking Eliquis without talking to the doctor who prescribed the medication.  Stopping may increase your risk of developing a stroke.  Refill your prescription before you run out.  After discharge, you should have regular check-up appointments with your healthcare provider that is prescribing your Eliquis.  In the future your dose may need to be changed if your kidney function or weight changes by a significant amount or as you get older.  What do you do if you miss a dose? If you miss a dose, take it as soon as you remember on the same day and resume taking twice daily.  Do not take more than one dose of ELIQUIS at the same time to make up a missed dose.  Important Safety Information A possible side effect of Eliquis is bleeding. You should call your healthcare  provider right away if you experience any of the following: ? Bleeding from an injury or your nose that does not stop. ? Unusual colored urine (red or dark brown) or unusual colored stools (red or black). ? Unusual bruising for unknown reasons. ? A serious fall or if you hit your head (even if there is no bleeding).  Some medicines may interact with Eliquis and might increase your risk of bleeding or clotting while on Eliquis. To help avoid this, consult your healthcare provider or pharmacist prior to using any new prescription or non-prescription medications, including herbals, vitamins, non-steroidal anti-inflammatory drugs (NSAIDs) and supplements.  This website has more information on Eliquis (apixaban): http://www.eliquis.com/eliquis/home

## 2020-02-25 NOTE — Discharge Summary (Signed)
[name] Rose MWU:[id number] DOB: 09-18-1950 DOA: 02/03/2020  PCP: [name] Frees, MD  Admit date: 02/03/2020 Discharge date: 02/25/2020  Admitted From: home Disposition:  home  Recommendations for Outpatient Follow-up:  1. Follow up with PCP in 1 weeks 2. Keep your follow up appointments with [facility] for BMP check on 11/17 and follow up on 11/24 3. Follow up with [name] Rose with GI in 4-6 weeks. Continue PPI 40 m BID x 2 months and then deescalate to once a day 4. Hold tikosyn and gabapentin given AKI  Home Health:PT and OT  Equipment/Devices:already has walker and wheelchair at home  Discharge Condition:Stable CODE STATUS:FULL  Diet recommendation: renal  Brief/Interim Summary: Hospital Course:   [name] a 69 year old male with medical history significant for metastatic bladder cancer on chemotherapy, PAF, CKD stage III who presented to the ED on 10/24 with complaints of generalized weakness and diarrhea for the past 4 to 5 days and was made with working diagnosis of failure to thrive in adult in the setting of recent chemotherapy complicated by diarrhea and severe pancytopenia (WBC of 0.2) adn BP 99/70.  During hospitalization antibiotics were not initially started given patient was afebrile but on night of admission patient became febrile and was started on cefepime for management of sepsis secondary to neutropenia blood cultures are positive for Klebsiella pneumonia.  Hospital course further complicated by AKI on CKD in setting of hypotension likely related to sepsis as well as acute blood loss anemia of unclear etiology.  EGD and capsule endoscopy was unremarkable he was continued on PPI and was able to resume home anticoagulation.   Sepsis secondary to Klebsiella pneumonia in the setting of neutropenia, resolved.  Patient completed course of antibiotics.  Sepsis physiology resolved.  Nonoliguric AKI on CKD stage IV stable.  Believed to be ATN related to  hypotension from sepsis physiology as well as low BP related to acute blood loss anemia suspected to be GI bleed that improved with IVF.  Peak Creatiine of 9.21, baseline1.8-2.1.  Patient is still making adequate urine suspect degree of post ATN diuresis , no edema, creatinine stable at 6.63.  No electrolyte abnormalities.  Has mild normal anion gap metabolic acidosis of 21.  -Continue to avoid nephrotoxins -plan for repeat BMP as outpatient by [facility] on 11/17, has follow up with [facility] on 11/24 -continue sodium bicarb  Acute blood loss anemia, resolved.  Initially suspected upper GI given reported melena.  EGD on 11/9 showed portal hypertensive gastropathy and gastric ulcer.   No bleeding on capsule endoscopy. Hgb stable at 8.2 -Appreciate GI recommendations, continue p.o. PPI BID x 2 months then switch to daily, avoid NSAIDs, ok to resume anticoagulation from GI perspective -tolerating home Eliquis  Metastatic bladder cancer on chemotherapy with pancytopenia.  White count now within normal limits.  Hemoglobin stable to slightly worsened in the setting of GI bleed --Continue monitor CBC -Outpatient follow up with [name] Rose  Atrial fibrillation, currently rate controlled.  Had RVR while only on metoprolol during hospital stay, diltiazem gtt weaned off -tolerating eliquis -Holding home Tikosyn given AKI -continue metoprolol and diltiazem per cardiology on discharge   Consultations:    Procedures/Studies: EGD, 11/9,  Normal esophagus. - Z-line regular, 40 cm from the incisors. - Gastritis. - Portal hypertensive gastropathy. - Non-bleeding gastric ulcer with no stigmata of bleeding. - Normal examined duodenum. - No specimens collected.  Capsule endoscopy, 11/15 No active bleedng seen  Subjective: Feels well. No SOB Discharge Exam: Vitals:  02/25/20 0758 02/25/20 1140  BP: (!) 107/59 (!) 106/56  Pulse: 93 72  Resp: 20 20  Temp: 98.2 F (36.8 C) 97.7 F (36.5 C)  SpO2: 99%  98%   Vitals:   02/25/20 0028 02/25/20 0437 02/25/20 0758 02/25/20 1140  BP: (!) 92/50 (!) 113/59 (!) 107/59 (!) 106/56  Pulse: 76 73 93 72  Resp: $Remo'18 17 20 20  'yeIUQ$ Temp: 98.9 F (37.2 C) 97.7 F (36.5 C) 98.2 F (36.8 C) 97.7 F (36.5 C)  TempSrc: Oral Oral Oral Oral  SpO2: 98% 99% 99% 98%  Weight:  117.2 kg    Height:        General: Lying in bed, no apparent distress Eyes: EOMI, anicteric ENT: Oral Mucosa clear and moist Cardiovascular: irregularly irregular rhythm,normal rate,  no murmurs, rubs or gallops, no edema, Respiratory: Normal respiratory effort on room air, lungs clear to auscultation bilaterally Abdomen: soft, non-distended, non-tender, normal bowel sounds Skin: dry skin Neurologic: Grossly no focal neuro deficit.Mental status AAOx3, speech normal, Psychiatric:Appropriate affect, and mood  Discharge Diagnoses:  Principal Problem:   Acute renal failure with acute tubular necrosis superimposed on stage 3 chronic kidney disease ([place]) Active Problems:   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 (HCC)   Diarrhea   Chronic a-fib (HCC)   Pancytopenia (HCC)   Failure to thrive in adult   Fall at home, initial encounter   Atrial fibrillation with RVR ([place])   Ileus ([place])   Abdominal distention   Melena    Discharge Instructions  Discharge Instructions    Diet - low sodium heart healthy   Complete by: As directed    Increase activity slowly   Complete by: As directed      Allergies as of 02/25/2020   No Known Allergies     Medication List    STOP taking these medications   amoxicillin 500 MG capsule Commonly known as: AMOXIL   dofetilide 250 MCG capsule Commonly known as: TIKOSYN   gabapentin 400 MG capsule Commonly known as: NEURONTIN   K-Phos-Neutral [phone] MG Tabs   metoprolol succinate 50 MG 24 hr tablet Commonly known as: TOPROL-XL   traMADol 50 MG tablet Commonly known as: ULTRAM     TAKE these medications    acetaminophen 500 MG tablet Commonly known as: TYLENOL Take 1,000 mg by mouth every 6 (six) hours as needed for mild pain or headache.   apixaban 5 MG Tabs tablet Commonly known as: Eliquis Take 1 tablet (5 mg total) by mouth 2 (two) times daily.   atorvastatin 40 MG tablet Commonly known as: LIPITOR Take 40 mg by mouth every evening.   cyclobenzaprine 7.5 MG tablet Commonly known as: FEXMID Take 1 tablet (7.5 mg total) by mouth 3 (three) times daily as needed for muscle spasms.   dexamethasone 4 MG tablet Commonly known as: DECADRON Take 2 tablets (8 mg) daily for 3 days after chemotherapy. Take with food.   diltiazem 180 MG 24 hr capsule Commonly known as: CARDIZEM CD Take 1 capsule (180 mg total) by mouth daily. Start taking on: February 26, 2020 What changed:   medication strength  how much to take   DULoxetine 60 MG capsule Commonly known as: CYMBALTA TAKE ONE CAPSULE BY MOUTH DAILY   lidocaine-prilocaine cream Commonly known as: EMLA Apply to affected area once   loperamide 2 MG tablet Commonly known as: IMODIUM A-D Take 2 at diarrhea onset, then 1 every 2hr until 12hrs with no BM. May take 2  every 4hrs at night. If diarrhea recurs repeat.   LORazepam 0.5 MG tablet Commonly known as: Ativan Take 1 tablet (0.5 mg total) by mouth every 6 (six) hours as needed (Nausea or vomiting).   magnesium oxide 400 MG tablet Commonly known as: MAG-OX Take 1 tablet (400 mg total) by mouth 2 (two) times daily.   metoprolol tartrate 50 MG tablet Commonly known as: LOPRESSOR Take 1 tablet (50 mg total) by mouth 2 (two) times daily.   oxybutynin 10 MG 24 hr tablet Commonly known as: DITROPAN-XL Take 10 mg by mouth at bedtime.   pantoprazole 40 MG tablet Commonly known as: PROTONIX Take 1 tablet (40 mg total) by mouth 2 (two) times daily.   potassium chloride 10 MEQ tablet Commonly known as: KLOR-CON Take 2 tablets (20 mEq total) by mouth daily.   prochlorperazine  10 MG tablet Commonly known as: COMPAZINE Take 1 tablet (10 mg total) by mouth every 6 (six) hours as needed (Nausea or vomiting). What changed: when to take this   pyridoxine 100 MG tablet Commonly known as: B-6 Take 200 mg by mouth daily with breakfast.   sodium bicarbonate 650 MG tablet Take 2 tablets (1,300 mg total) by mouth 2 (two) times daily.   VITAMIN D-3 PO Take 1 capsule by mouth daily with breakfast.       Follow-up Information    Wilford Corner, MD. Schedule an appointment as soon as possible for a visit in 5 week(s).   Specialty: Gastroenterology Why: Follow-up for anemia and gastric ulcer Contact information: [address] [place] [address] [phone]        [facility], [facility] Follow up.   Specialty: Home Health Services Why: [place], [place] information: [place] [address] [phone]        [name] Frees, MD. Go on 02/29/2020.   Specialty: Family Medicine Why: [email or web address]$ :45am Contact information: [place] [address] [phone]        [facility], [place]. Go on 03/05/2020.   Why: Appointment is 11/24 at 1030  Contact information: [place] [address] [phone]              No Known Allergies      The results of significant diagnostics from this hospitalization (including imaging, microbiology, ancillary and laboratory) are listed below for reference.     Microbiology: No results found for this or any previous visit (from the past 240 hour(s)).   Labs: BNP (last 3 results) No results for input(s): BNP in the last 8760 hours. Basic Metabolic Panel: Recent Labs  Lab 02/21/20 0157 02/22/20 0333 02/23/20 0453 02/24/20 0500 02/25/20 0530  NA 137 135 135 136 136  K 3.3* 3.2* 3.4* 3.4* 3.5  CL 108 106 104 105 105  CO2 18* 21* 22 21* 23  GLUCOSE 114* 105* 110* 109* 107*  BUN 62* 55* 48* 48* 45*  CREATININE 6.72* 6.65*  6.69* 6.63* 6.63*  CALCIUM 7.1* 7.4* 8.1* 7.9* 8.0*  MG 1.2* 1.6* 2.5* 1.8 1.4*   Liver Function Tests: Recent Labs  Lab 02/20/20 0620 02/21/20 0157 02/22/20 0333 02/23/20 0453 02/24/20 0500  AST $Re'28 24 21 21 21  'GvI$ ALT $R'7 7 6 8 8  'gj$ ALKPHOS 54 61 61 63 65  BILITOT 0.7 0.5 0.5 0.6 0.5  PROT 4.5* 4.7* 4.9* 5.1* 4.9*  ALBUMIN 1.9* 2.0* 2.0* 2.1* 2.0*   No results for input(s): LIPASE, AMYLASE in the last  168 hours. No results for input(s): AMMONIA in the last 168 hours. CBC: Recent Labs  Lab 02/19/20 1518 02/19/20 1518 02/19/20 2214 02/20/20 0620 02/21/20 0157 02/22/20 0333 02/23/20 0453  WBC 6.2  --   --  6.0 6.5 6.3 6.3  NEUTROABS 4.4  --   --  4.2 4.2 4.1 3.9  HGB 6.7*   < > 7.5* 7.2* 7.5* 8.0* 8.2*  HCT 20.6*   < > 23.1* 21.8* 22.8* 25.0* 25.4*  MCV 98.1  --   --  97.8 97.9 99.6 97.7  PLT 94*  --   --  95* 87* 105* 122*   < > = values in this interval not displayed.   Cardiac Enzymes: No results for input(s): CKTOTAL, CKMB, CKMBINDEX, TROPONINI in the last 168 hours. BNP: Invalid input(s): POCBNP CBG: No results for input(s): GLUCAP in the last 168 hours. D-Dimer No results for input(s): DDIMER in the last 72 hours. Hgb A1c No results for input(s): HGBA1C in the last 72 hours. Lipid Profile No results for input(s): CHOL, HDL, LDLCALC, TRIG, CHOLHDL, LDLDIRECT in the last 72 hours. Thyroid function studies No results for input(s): TSH, T4TOTAL, T3FREE, THYROIDAB in the last 72 hours.  Invalid input(s): FREET3 Anemia work up No results for input(s): VITAMINB12, FOLATE, FERRITIN, TIBC, IRON, RETICCTPCT in the last 72 hours. Urinalysis    Component Value Date/Time   COLORURINE YELLOW 02/03/2020 1252   APPEARANCEUR HAZY (A) 02/03/2020 1252   LABSPEC 1.012 02/03/2020 1252   PHURINE 5.0 02/03/2020 1252   GLUCOSEU >=500 (A) 02/03/2020 1252   HGBUR LARGE (A) 02/03/2020 1252   BILIRUBINUR NEGATIVE 02/03/2020 1252   KETONESUR NEGATIVE 02/03/2020 1252   PROTEINUR  100 (A) 02/03/2020 1252   UROBILINOGEN 0.2 01/31/2010 0624   NITRITE NEGATIVE 02/03/2020 1252   LEUKOCYTESUR NEGATIVE 02/03/2020 1252   Sepsis Labs Invalid input(s): PROCALCITONIN,  WBC,  LACTICIDVEN Microbiology No results found for this or any previous visit (from the past 240 hour(s)).   Time coordinating discharge: Over 30 minutes  SIGNED:   Desiree Hane, MD  [facility] 02/25/2020, 2:39 PM Pager   If 7PM-7AM, please contact night-coverage [email or web address] Password [email or web address]

## 2020-02-29 DIAGNOSIS — D631 Anemia in chronic kidney disease: Secondary | ICD-10-CM | POA: Diagnosis not present

## 2020-02-29 DIAGNOSIS — I480 Paroxysmal atrial fibrillation: Secondary | ICD-10-CM | POA: Diagnosis not present

## 2020-02-29 DIAGNOSIS — R110 Nausea: Secondary | ICD-10-CM | POA: Diagnosis not present

## 2020-02-29 DIAGNOSIS — I10 Essential (primary) hypertension: Secondary | ICD-10-CM | POA: Diagnosis not present

## 2020-02-29 DIAGNOSIS — N184 Chronic kidney disease, stage 4 (severe): Secondary | ICD-10-CM | POA: Diagnosis not present

## 2020-02-29 DIAGNOSIS — C679 Malignant neoplasm of bladder, unspecified: Secondary | ICD-10-CM | POA: Diagnosis not present

## 2020-03-04 DIAGNOSIS — N183 Chronic kidney disease, stage 3 unspecified: Secondary | ICD-10-CM | POA: Diagnosis not present

## 2020-03-04 DIAGNOSIS — I4891 Unspecified atrial fibrillation: Secondary | ICD-10-CM | POA: Diagnosis not present

## 2020-03-04 DIAGNOSIS — D630 Anemia in neoplastic disease: Secondary | ICD-10-CM | POA: Diagnosis not present

## 2020-03-04 DIAGNOSIS — N186 End stage renal disease: Secondary | ICD-10-CM | POA: Diagnosis not present

## 2020-03-04 DIAGNOSIS — Z8546 Personal history of malignant neoplasm of prostate: Secondary | ICD-10-CM | POA: Diagnosis not present

## 2020-03-04 DIAGNOSIS — E785 Hyperlipidemia, unspecified: Secondary | ICD-10-CM | POA: Diagnosis not present

## 2020-03-04 DIAGNOSIS — C679 Malignant neoplasm of bladder, unspecified: Secondary | ICD-10-CM | POA: Diagnosis not present

## 2020-03-04 DIAGNOSIS — C772 Secondary and unspecified malignant neoplasm of intra-abdominal lymph nodes: Secondary | ICD-10-CM | POA: Diagnosis not present

## 2020-03-04 DIAGNOSIS — R7303 Prediabetes: Secondary | ICD-10-CM | POA: Diagnosis not present

## 2020-03-04 DIAGNOSIS — I1311 Hypertensive heart and chronic kidney disease without heart failure, with stage 5 chronic kidney disease, or end stage renal disease: Secondary | ICD-10-CM | POA: Diagnosis not present

## 2020-03-04 DIAGNOSIS — D631 Anemia in chronic kidney disease: Secondary | ICD-10-CM | POA: Diagnosis not present

## 2020-03-04 DIAGNOSIS — Z96642 Presence of left artificial hip joint: Secondary | ICD-10-CM | POA: Diagnosis not present

## 2020-03-04 DIAGNOSIS — M6281 Muscle weakness (generalized): Secondary | ICD-10-CM | POA: Diagnosis not present

## 2020-03-04 DIAGNOSIS — R627 Adult failure to thrive: Secondary | ICD-10-CM | POA: Diagnosis not present

## 2020-03-04 DIAGNOSIS — R197 Diarrhea, unspecified: Secondary | ICD-10-CM | POA: Diagnosis not present

## 2020-03-04 DIAGNOSIS — I480 Paroxysmal atrial fibrillation: Secondary | ICD-10-CM | POA: Diagnosis not present

## 2020-03-04 DIAGNOSIS — Z96653 Presence of artificial knee joint, bilateral: Secondary | ICD-10-CM | POA: Diagnosis not present

## 2020-03-04 DIAGNOSIS — I10 Essential (primary) hypertension: Secondary | ICD-10-CM | POA: Diagnosis not present

## 2020-03-04 DIAGNOSIS — N184 Chronic kidney disease, stage 4 (severe): Secondary | ICD-10-CM | POA: Diagnosis not present

## 2020-03-04 DIAGNOSIS — Z87891 Personal history of nicotine dependence: Secondary | ICD-10-CM | POA: Diagnosis not present

## 2020-03-04 DIAGNOSIS — D61818 Other pancytopenia: Secondary | ICD-10-CM | POA: Diagnosis not present

## 2020-03-04 DIAGNOSIS — E7800 Pure hypercholesterolemia, unspecified: Secondary | ICD-10-CM | POA: Diagnosis not present

## 2020-03-04 DIAGNOSIS — D62 Acute posthemorrhagic anemia: Secondary | ICD-10-CM | POA: Diagnosis not present

## 2020-03-04 DIAGNOSIS — Z7901 Long term (current) use of anticoagulants: Secondary | ICD-10-CM | POA: Diagnosis not present

## 2020-03-04 DIAGNOSIS — Z9181 History of falling: Secondary | ICD-10-CM | POA: Diagnosis not present

## 2020-03-05 ENCOUNTER — Other Ambulatory Visit: Payer: Self-pay | Admitting: Hematology & Oncology

## 2020-03-05 DIAGNOSIS — N1832 Chronic kidney disease, stage 3b: Secondary | ICD-10-CM | POA: Diagnosis not present

## 2020-03-05 DIAGNOSIS — I120 Hypertensive chronic kidney disease with stage 5 chronic kidney disease or end stage renal disease: Secondary | ICD-10-CM | POA: Diagnosis not present

## 2020-03-05 DIAGNOSIS — C679 Malignant neoplasm of bladder, unspecified: Secondary | ICD-10-CM | POA: Diagnosis not present

## 2020-03-05 DIAGNOSIS — N2581 Secondary hyperparathyroidism of renal origin: Secondary | ICD-10-CM | POA: Diagnosis not present

## 2020-03-05 DIAGNOSIS — N1831 Chronic kidney disease, stage 3a: Secondary | ICD-10-CM | POA: Diagnosis not present

## 2020-03-05 DIAGNOSIS — E876 Hypokalemia: Secondary | ICD-10-CM | POA: Diagnosis not present

## 2020-03-05 DIAGNOSIS — I480 Paroxysmal atrial fibrillation: Secondary | ICD-10-CM | POA: Diagnosis not present

## 2020-03-05 DIAGNOSIS — N185 Chronic kidney disease, stage 5: Secondary | ICD-10-CM | POA: Diagnosis not present

## 2020-03-05 DIAGNOSIS — E1122 Type 2 diabetes mellitus with diabetic chronic kidney disease: Secondary | ICD-10-CM | POA: Diagnosis not present

## 2020-03-05 DIAGNOSIS — E559 Vitamin D deficiency, unspecified: Secondary | ICD-10-CM | POA: Diagnosis not present

## 2020-03-05 DIAGNOSIS — N179 Acute kidney failure, unspecified: Secondary | ICD-10-CM | POA: Diagnosis not present

## 2020-03-10 ENCOUNTER — Other Ambulatory Visit: Payer: Self-pay

## 2020-03-10 ENCOUNTER — Inpatient Hospital Stay: Payer: PPO | Attending: Hematology & Oncology

## 2020-03-10 ENCOUNTER — Telehealth: Payer: Self-pay | Admitting: Hematology & Oncology

## 2020-03-10 ENCOUNTER — Inpatient Hospital Stay: Payer: PPO

## 2020-03-10 ENCOUNTER — Telehealth: Payer: Self-pay | Admitting: *Deleted

## 2020-03-10 ENCOUNTER — Inpatient Hospital Stay (HOSPITAL_BASED_OUTPATIENT_CLINIC_OR_DEPARTMENT_OTHER): Payer: PPO | Admitting: Hematology & Oncology

## 2020-03-10 ENCOUNTER — Encounter: Payer: Self-pay | Admitting: Hematology & Oncology

## 2020-03-10 VITALS — BP 145/114 | HR 108 | Temp 97.8°F | Resp 24 | Wt 250.0 lb

## 2020-03-10 DIAGNOSIS — R531 Weakness: Secondary | ICD-10-CM | POA: Diagnosis not present

## 2020-03-10 DIAGNOSIS — Z9221 Personal history of antineoplastic chemotherapy: Secondary | ICD-10-CM | POA: Insufficient documentation

## 2020-03-10 DIAGNOSIS — D649 Anemia, unspecified: Secondary | ICD-10-CM | POA: Insufficient documentation

## 2020-03-10 DIAGNOSIS — Z79899 Other long term (current) drug therapy: Secondary | ICD-10-CM | POA: Diagnosis not present

## 2020-03-10 DIAGNOSIS — C679 Malignant neoplasm of bladder, unspecified: Secondary | ICD-10-CM | POA: Diagnosis not present

## 2020-03-10 DIAGNOSIS — D62 Acute posthemorrhagic anemia: Secondary | ICD-10-CM

## 2020-03-10 DIAGNOSIS — C7889 Secondary malignant neoplasm of other digestive organs: Secondary | ICD-10-CM | POA: Diagnosis present

## 2020-03-10 DIAGNOSIS — C772 Secondary and unspecified malignant neoplasm of intra-abdominal lymph nodes: Secondary | ICD-10-CM

## 2020-03-10 DIAGNOSIS — I4891 Unspecified atrial fibrillation: Secondary | ICD-10-CM | POA: Insufficient documentation

## 2020-03-10 LAB — CBC WITH DIFFERENTIAL (CANCER CENTER ONLY)
Abs Immature Granulocytes: 0.26 10*3/uL — ABNORMAL HIGH (ref 0.00–0.07)
Basophils Absolute: 0.1 10*3/uL (ref 0.0–0.1)
Basophils Relative: 1 %
Eosinophils Absolute: 0 10*3/uL (ref 0.0–0.5)
Eosinophils Relative: 0 %
HCT: 32.1 % — ABNORMAL LOW (ref 39.0–52.0)
Hemoglobin: 10.3 g/dL — ABNORMAL LOW (ref 13.0–17.0)
Immature Granulocytes: 2 %
Lymphocytes Relative: 11 %
Lymphs Abs: 1.7 10*3/uL (ref 0.7–4.0)
MCH: 31.6 pg (ref 26.0–34.0)
MCHC: 32.1 g/dL (ref 30.0–36.0)
MCV: 98.5 fL (ref 80.0–100.0)
Monocytes Absolute: 1.5 10*3/uL — ABNORMAL HIGH (ref 0.1–1.0)
Monocytes Relative: 10 %
Neutro Abs: 11.5 10*3/uL — ABNORMAL HIGH (ref 1.7–7.7)
Neutrophils Relative %: 76 %
Platelet Count: 169 10*3/uL (ref 150–400)
RBC: 3.26 MIL/uL — ABNORMAL LOW (ref 4.22–5.81)
RDW: 15.9 % — ABNORMAL HIGH (ref 11.5–15.5)
WBC Count: 15 10*3/uL — ABNORMAL HIGH (ref 4.0–10.5)
nRBC: 0 % (ref 0.0–0.2)

## 2020-03-10 LAB — CMP (CANCER CENTER ONLY)
ALT: 17 U/L (ref 0–44)
AST: 24 U/L (ref 15–41)
Albumin: 3.5 g/dL (ref 3.5–5.0)
Alkaline Phosphatase: 104 U/L (ref 38–126)
Anion gap: 14 (ref 5–15)
BUN: 36 mg/dL — ABNORMAL HIGH (ref 8–23)
CO2: 21 mmol/L — ABNORMAL LOW (ref 22–32)
Calcium: 9.1 mg/dL (ref 8.9–10.3)
Chloride: 100 mmol/L (ref 98–111)
Creatinine: 5.31 mg/dL (ref 0.61–1.24)
GFR, Estimated: 11 mL/min — ABNORMAL LOW (ref >60)
Glucose, Bld: 209 mg/dL — ABNORMAL HIGH (ref 70–99)
Potassium: 3.3 mmol/L — ABNORMAL LOW (ref 3.5–5.1)
Sodium: 135 mmol/L (ref 135–145)
Total Bilirubin: 0.4 mg/dL (ref 0.3–1.2)
Total Protein: 7 g/dL (ref 6.5–8.1)

## 2020-03-10 LAB — IRON AND TIBC
Iron: 86 ug/dL (ref 42–163)
Saturation Ratios: 48 % (ref 20–55)
TIBC: 178 ug/dL — ABNORMAL LOW (ref 202–409)
UIBC: 92 ug/dL — ABNORMAL LOW (ref 117–376)

## 2020-03-10 LAB — PHOSPHORUS: Phosphorus: 3.5 mg/dL (ref 2.5–4.6)

## 2020-03-10 LAB — RETICULOCYTES
Immature Retic Fract: 16.2 % — ABNORMAL HIGH (ref 2.3–15.9)
RBC.: 3.25 MIL/uL — ABNORMAL LOW (ref 4.22–5.81)
Retic Count, Absolute: 58.5 10*3/uL (ref 19.0–186.0)
Retic Ct Pct: 1.8 % (ref 0.4–3.1)

## 2020-03-10 LAB — MAGNESIUM: Magnesium: 1.2 mg/dL — ABNORMAL LOW (ref 1.7–2.4)

## 2020-03-10 LAB — FERRITIN: Ferritin: 2216 ng/mL — ABNORMAL HIGH (ref 24–336)

## 2020-03-10 NOTE — Progress Notes (Signed)
Hematology and Oncology Follow Up Visit  [name] Rose [id number] [birth year] 69 y.o. 03/10/2020   Principle Diagnosis:  Metastatic high-grade bladder cancer - recurrent Anemia of renal insufficiency Diverticular abscess - E.coli  Past Therapy: Atezolizumab 1200mg  IV q 3 wks - s/p cycle #4 - d/c due to progression. Taxotere 80mg /m2 IV q 3 wks - s/p cycle #3 - d/c due to progression M-VAC - s/p cycle#8 -- d/c on 07/10/2018 due to blood counts Padcev -- s/p cycle #1 on 04/06/2020d/c 08/28/2018  Current Therapy: Trodelvy8 mg/kg IV d1&8 -- s/p cycle #3  -- start on 12/31/2019 Aranesp 300 mcg sq q 3 week for Hgb < 11 Ifosfamide/Gemzar -- s/p cycle #9- started 04/16/2019  -- d/c on 12/21/2019 due to progression IV iron as indicated   Interim History:  [name] Rose is here today for follow-up.  Unfortunately, he is not doing all that well.  He really feels weak.  He feels tired.  I think a lot of this is the atrial fibrillation.  He was in the hospital recently for GI bleeding and diarrhea.  He had profound renal failure.  Thankfully he did not require dialysis.  His creatinine is a little bit better today at 5.6.  He is off medicines for his atrial fibrillation.  He will see the cardiologist tomorrow.  He really is symptomatic from the atrial fibrillation.  He is not that anemic.  As such, we do not have to transfuse him.  I would not give him any Aranesp today.  He did have a nice Thanksgiving.  He ate okay.  He did get sick with vomiting yesterday.  He has had no problems with diarrhea.  He has had no cough.  He has had no chest pain.  We clearly have to rescan him to see how his cancer is looking since his last treatment was probably 2 months ago.  Currently, his performance status is ECOG 2.    Medications:  Allergies as of 03/10/2020   No Known Allergies     Medication List       Accurate as of March 10, 2020  9:05 AM. If you have any questions, ask  your nurse or doctor.        acetaminophen 500 MG tablet Commonly known as: TYLENOL Take 1,000 mg by mouth every 6 (six) hours as needed for mild pain or headache.   apixaban 5 MG Tabs tablet Commonly known as: Eliquis Take 1 tablet (5 mg total) by mouth 2 (two) times daily.   atorvastatin 40 MG tablet Commonly known as: LIPITOR Take 40 mg by mouth every evening.   cyclobenzaprine 7.5 MG tablet Commonly known as: FEXMID Take 1 tablet (7.5 mg total) by mouth 3 (three) times daily as needed for muscle spasms.   dexamethasone 4 MG tablet Commonly known as: DECADRON Take 2 tablets (8 mg) daily for 3 days after chemotherapy. Take with food.   diltiazem 180 MG 24 hr capsule Commonly known as: CARDIZEM CD Take 1 capsule (180 mg total) by mouth daily.   DULoxetine 60 MG capsule Commonly known as: CYMBALTA TAKE ONE CAPSULE BY MOUTH DAILY   lidocaine-prilocaine cream Commonly known as: EMLA Apply to affected area once   loperamide 2 MG tablet Commonly known as: IMODIUM A-D Take 2 at diarrhea onset, then 1 every 2hr until 12hrs with no BM. May take 2 every 4hrs at night. If diarrhea recurs repeat.   LORazepam 0.5 MG tablet Commonly known as: Ativan Take 1 tablet (0.5  mg total) by mouth every 6 (six) hours as needed (Nausea or vomiting).   magnesium oxide 400 MG tablet Commonly known as: MAG-OX Take 1 tablet (400 mg total) by mouth 2 (two) times daily.   metoprolol tartrate 50 MG tablet Commonly known as: LOPRESSOR Take 1 tablet (50 mg total) by mouth 2 (two) times daily.   oxybutynin 10 MG 24 hr tablet Commonly known as: DITROPAN-XL Take 10 mg by mouth at bedtime.   pantoprazole 40 MG tablet Commonly known as: PROTONIX Take 1 tablet (40 mg total) by mouth 2 (two) times daily.   potassium chloride 10 MEQ tablet Commonly known as: KLOR-CON Take 2 tablets (20 mEq total) by mouth daily.   prochlorperazine 10 MG tablet Commonly known as: COMPAZINE Take 1 tablet (10  mg total) by mouth every 6 (six) hours as needed (Nausea or vomiting). What changed: when to take this   pyridoxine 100 MG tablet Commonly known as: B-6 Take 200 mg by mouth daily with breakfast.   sodium bicarbonate 650 MG tablet Take 2 tablets (1,300 mg total) by mouth 2 (two) times daily.   VITAMIN D-3 PO Take 1 capsule by mouth daily with breakfast.       Allergies: No Known Allergies  Past Medical History, Surgical history, Social history, and Family History were reviewed and updated.  Review of Systems: Review of Systems  Constitutional: Negative.   HENT: Negative.   Eyes: Negative.   Respiratory: Negative.   Cardiovascular: Positive for palpitations.  Gastrointestinal: Positive for nausea.  Genitourinary: Negative.   Musculoskeletal: Positive for myalgias.  Skin: Negative.   Neurological: Positive for tingling.  Endo/Heme/Allergies: Negative.   Psychiatric/Behavioral: Negative.      Physical Exam:  weight is 250 lb (113.4 kg). His oral temperature is 97.8 F (36.6 C). His blood pressure is 145/114 (abnormal) and his pulse is 108 (abnormal). His respiration is 24 (abnormal) and oxygen saturation is 91%.   Wt Readings from Last 3 Encounters:  03/10/20 250 lb (113.4 kg)  02/25/20 258 lb 4.8 oz (117.2 kg)  01/28/20 270 lb 9.6 oz (122.7 kg)    Physical Exam Vitals reviewed.  HENT:     Head: Normocephalic and atraumatic.  Eyes:     Pupils: Pupils are equal, round, and reactive to light.  Cardiovascular:     Rate and Rhythm: Tachycardia present. Rhythm irregular.     Heart sounds: Normal heart sounds.     Comments: Cardiac exam is tachycardic and irregular consistent with the atrial fibrillation.  There are no murmurs. Pulmonary:     Effort: Pulmonary effort is normal.     Breath sounds: Normal breath sounds.  Abdominal:     General: Bowel sounds are normal.     Palpations: Abdomen is soft.  Musculoskeletal:        General: No tenderness or deformity.  Normal range of motion.     Cervical back: Normal range of motion.  Lymphadenopathy:     Cervical: No cervical adenopathy.  Skin:    General: Skin is warm and dry.     Findings: No erythema or rash.  Neurological:     Mental Status: He is alert and oriented to person, place, and time.  Psychiatric:        Behavior: Behavior normal.        Thought Content: Thought content normal.        Judgment: Judgment normal.      Lab Results  Component Value Date   WBC 15.0 (  H) 03/10/2020   HGB 10.3 (L) 03/10/2020   HCT 32.1 (L) 03/10/2020   MCV 98.5 03/10/2020   PLT 169 03/10/2020   Lab Results  Component Value Date   FERRITIN 1,500 (H) 02/18/2020   IRON 57 02/18/2020   TIBC 125 (L) 02/18/2020   UIBC 68 02/18/2020   IRONPCTSAT 46 (H) 02/18/2020   Lab Results  Component Value Date   RETICCTPCT 1.8 03/10/2020   RBC 3.25 (L) 03/10/2020   RBC 3.26 (L) 03/10/2020   No results found for: KPAFRELGTCHN, LAMBDASER, KAPLAMBRATIO No results found for: IGGSERUM, IGA, IGMSERUM No results found for: [name], SPEI   Chemistry      Component Value Date/Time   NA 136 02/25/2020 0530   NA 140 12/18/2019 1411   NA 141 02/21/2017 0902   K 3.5 02/25/2020 0530   K 4.1 02/21/2017 0902   CL 105 02/25/2020 0530   CL 98 02/21/2017 0902   CO2 23 02/25/2020 0530   CO2 31 02/21/2017 0902   BUN 45 (H) 02/25/2020 0530   BUN 25 12/18/2019 1411   BUN 17 02/21/2017 0902   CREATININE 6.63 (H) 02/25/2020 0530   CREATININE 2.46 (H) 01/28/2020 1200   CREATININE 1.2 02/21/2017 0902      Component Value Date/Time   CALCIUM 8.0 (L) 02/25/2020 0530   CALCIUM 9.2 02/21/2017 0902   ALKPHOS 65 02/24/2020 0500   ALKPHOS 65 02/21/2017 0902   AST 21 02/24/2020 0500   AST 19 01/28/2020 1200   ALT 8 02/24/2020 0500   ALT 25 01/28/2020 1200   ALT 18 02/21/2017 0902   BILITOT 0.5 02/24/2020 0500   BILITOT 0.5 01/28/2020 1200       Impression and  Plan: Mr. Chaloux is a very pleasant 69yo caucasian gentleman with metastatic high grade bladder cancer.  He definitely is not doing as well today.  Again I have to believe this is the atrial fibrillation.  His potassium is a little bit on the lower side.  I told him to double his potassium for the next 4 days.  Again he sees the cardiologist tomorrow.  We will set up a PET scan for him in about 2 weeks.  I will see him back in about 3 weeks.   Volanda Napoleon, MD 11/29/20219:05 AM

## 2020-03-10 NOTE — Telephone Encounter (Signed)
Appointments scheduled calendar mailed per 11/29 los

## 2020-03-10 NOTE — Telephone Encounter (Signed)
Critical Creatinine received by Richardson Landry in lab.  Result is 5.31. Dr Marin Olp is aware.  With patient in office.

## 2020-03-10 NOTE — Patient Instructions (Signed)

## 2020-03-11 ENCOUNTER — Encounter: Payer: Self-pay | Admitting: Internal Medicine

## 2020-03-11 ENCOUNTER — Ambulatory Visit (INDEPENDENT_AMBULATORY_CARE_PROVIDER_SITE_OTHER): Payer: PPO | Admitting: Internal Medicine

## 2020-03-11 VITALS — BP 101/66 | HR 74 | Temp 96.8°F | Ht 74.0 in | Wt 247.0 lb

## 2020-03-11 DIAGNOSIS — C772 Secondary and unspecified malignant neoplasm of intra-abdominal lymph nodes: Secondary | ICD-10-CM | POA: Diagnosis not present

## 2020-03-11 DIAGNOSIS — N1832 Chronic kidney disease, stage 3b: Secondary | ICD-10-CM | POA: Diagnosis not present

## 2020-03-11 DIAGNOSIS — C679 Malignant neoplasm of bladder, unspecified: Secondary | ICD-10-CM | POA: Diagnosis not present

## 2020-03-11 DIAGNOSIS — I4819 Other persistent atrial fibrillation: Secondary | ICD-10-CM | POA: Diagnosis not present

## 2020-03-11 DIAGNOSIS — I10 Essential (primary) hypertension: Secondary | ICD-10-CM

## 2020-03-11 NOTE — Patient Instructions (Signed)
Medication Instructions:  Your physician recommends that you continue on your current medications as directed. Please refer to the Current Medication list given to you today.  *If you need a refill on your cardiac medications before your next appointment, please call your pharmacy*  Lab Work: NONE  Testing/Procedures: NONE  Follow-Up: At [facility], you and your health needs are our priority.  As part of our continuing mission to provide you with exceptional heart care, we have created designated Provider Care Teams.  These Care Teams include your primary Cardiologist (physician) and Advanced Practice Providers (APPs -  Physician Assistants and Nurse Practitioners) who all work together to provide you with the care you need, when you need it.  We recommend signing up for the patient portal called "MyChart".  Sign up information is provided on this After Visit Summary.  MyChart is used to connect with patients for Virtual Visits (Telemedicine).  Patients are able to view lab/test results, encounter notes, upcoming appointments, etc.  Non-urgent messages can be sent to your provider as well.   To learn more about what you can do with MyChart, go to [email or web address].    Your next appointment:   6 month(s)  The format for your next appointment:   In Person  Provider:   You may see Pixie Casino, MD or one of the following Advanced Practice Providers on your designated Care Team:    Almyra Deforest, PA-C  Fabian Sharp, PA-C or   Roby Lofts, PA-C  Other Instructions  You have been referred to ELECTROPHYSIOLOGY AT [place] [address]

## 2020-03-11 NOTE — Progress Notes (Signed)
OFFICE NOTE  Chief Complaint:  Follow-up A. fib  Primary Care Physician: Johny Blamer, MD  HPI:  [name] Rose is a 69 y.o. male with a past medial history significant for bladder cancer which is metastatic, recent admission for sepsis and MSSA bacteremia.  He presents today to establish care with me as a new patient.  Initially he was seen for A. fib with RVR by Dr. Jacinto Halim in the hospital.  It was recommended that he go on to metoprolol and diltiazem for rate control, but rhythm control was not pursued as he could not be anticoagulated due to anemia and thrombocytopenia (a result of chemotherapy).  During that admission he was seen by infectious diseases who recommended trans-esophageal echocardiogram given the fact that he has a chemotherapy port to rule out endocarditis.  This was negative for endocarditis and did show moderate mitral regurgitation and no left atrial appendage thrombus.  He wished to follow-up with [place] rather than Dr. Jacinto Halim to keep his care providers in the same system.  Today he has no complaints.  He denies any worsening shortness of breath or chest pain.  He denies any palpitations.  His EKG shows sinus rhythm.  Recent labs from a few days ago showed that he remains anemic with a hemoglobin of 8.7 and platelet count of 85,000.   04/10/2019  [name] Rose is seen today for follow-up.  Over the summer he had a number of issues including some worsening heart failure as well as A. fib with RVR.  Recently he saw Judy Pimple, PA-C, who consolidated his A. fib medications including the diltiazem for better rate control.  Today presents in persistent A. fib with RVR at 110.  Blood pressure is elevated.  He saw his oncologist recently and lab work shows some improvement in his anemia and thrombocytopenia and he was advised that it was okay to start on Eliquis.  Due to some confusion however he did not start that medication 3 weeks ago as  recommended.  03/11/2020  [name] Rose is seen today in follow-up.  He was just hospitalized for falls and weakness.  He was noted to be in persistent atrial fibrillation.  He had failed cardioversion with ERAF and was placed on dofetilide.  During his hospitalization he had worsening renal function and the Tikosyn was held.  Plan was then changed to switch to rate control.  He is currently on diltiazem and metoprolol.  He has a chads vas score of 2 and has been on Eliquis.  He struggled with thrombocytopenia with platelet counts that have varied between 30 and 50,000.  He also had low white counts due to chemotherapy for metastatic bladder cancer.  Although heart rate is controlled at rest he says he gets A. fib with RVR with minimal exertion and is winded very easily.  He feels like he is quite symptomatic with that and feels that he needs to evaluate other options for rhythm control.  PMHx:  Past Medical History:  Diagnosis Date  . Anemia   . Arthritis   . 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 (HCC) 09/29/2016  . Bladder tumor   . Chronic kidney disease    ckd stage 3, sees dr every 6 months, arf hemodialysis done x 2 2020  . Dyspnea    with exertion  . Dysrhythmia    atrial fib  . Essential hypertension 06/23/2018  . Goals of care, counseling/discussion 09/30/2016  . History of prostate cancer followed by pcp dr  harris-  per pt last PSA undetectable   dx 2008-- (Stage T1c, Gleason 3+3,  PSA 4.58, vol 99cc)  s/p  radical prostatectomy (nerve sparing bilateral)   . Hyperglycemia 06/23/2018  . Hypertension   . Mild hyperlipidemia 06/23/2018  . Pre-diabetes   . Wears glasses     Past Surgical History:  Procedure Laterality Date  . CARDIOVERSION N/A 05/18/2019   Procedure: CARDIOVERSION;  Surgeon: Lelon Perla, MD;  Location: [facility] ENDOSCOPY;  Service: Cardiovascular;  Laterality: N/A;  . CARDIOVERSION N/A 09/27/2019   Procedure: CARDIOVERSION;  Surgeon: Donato Heinz, MD;  Location: [place];  Service: Cardiovascular;  Laterality: N/A;  . CATARACT EXTRACTION W/ INTRAOCULAR LENS  IMPLANT, BILATERAL Bilateral 2011  . [place]  . ESOPHAGOGASTRODUODENOSCOPY N/A 02/19/2020   Procedure: ESOPHAGOGASTRODUODENOSCOPY (EGD);  Surgeon: Wilford Corner, MD;  Location: [place];  Service: Gastroenterology;  Laterality: N/A;  . GIVENS CAPSULE STUDY N/A 02/19/2020   Procedure: GIVENS CAPSULE STUDY;  Surgeon: Wilford Corner, MD;  Location: [place];  Service: Gastroenterology;  Laterality: N/A;  . IR FLUORO GUIDE PORT INSERTION RIGHT  10/07/2016  . IR RADIOLOGIST EVAL & MGMT  01/05/2018  . IR US GUIDE VASC ACCESS RIGHT  10/07/2016  . KNEE ARTHROSCOPY Bilateral right 2006;  left 02-15-2007  . [place];  1990;  1983  . ROBOT ASSISTED LAPAROSCOPIC RADICAL PROSTATECTOMY  06/20/2006   bilateral nerve sparing  . TEE WITHOUT CARDIOVERSION N/A 06/27/2018   Procedure: TRANSESOPHAGEAL ECHOCARDIOGRAM (TEE);  Surgeon: Nigel Mormon, MD;  Location: [facility] ENDOSCOPY;  Service: Cardiovascular;  Laterality: N/A;  . TOTAL HIP ARTHROPLASTY Left 03/03/2015   Procedure: LEFT TOTAL HIP ARTHROPLASTY ANTERIOR APPROACH;  Surgeon: Dorna Leitz, MD;  Location: [place];  Service: Orthopedics;  Laterality: Left;  . TOTAL KNEE ARTHROPLASTY Bilateral left 08-13-2009;  right 12-26-2009  . TRANSURETHRAL RESECTION OF BLADDER TUMOR N/A 09/20/2016   Procedure: TRANSURETHRAL RESECTION OF BLADDER TUMOR (TURBT);  Surgeon: Franchot Gallo, MD;  Location: [facility];  Service: Urology;  Laterality: N/A;  . TRANSURETHRAL RESECTION OF BLADDER TUMOR WITH MITOMYCIN-C N/A 08/06/2019   Procedure: TRANSURETHRAL RESECTION OF BLADDER TUMOR;  Surgeon: Franchot Gallo, MD;  Location: [facility];  Service: Urology;  Laterality: N/A;    FAMHx:  Family History  Problem Relation Age of Onset  . Hypertension Mother   .  Aneurysm Mother   . Emphysema Father   . Hypertension Father     SOCHx:   reports that he quit smoking about 35 years ago. His smoking use included cigarettes. He has a 16.00 pack-year smoking history. He has never used smokeless tobacco. He reports current alcohol use. He reports current drug use. Drug: Marijuana.  ALLERGIES:  No Known Allergies  ROS: A comprehensive review of systems was negative.  HOME MEDS: Current Outpatient Medications on File Prior to Visit  Medication Sig Dispense Refill  . acetaminophen (TYLENOL) 500 MG tablet Take 1,000 mg by mouth every 6 (six) hours as needed for mild pain or headache.    Marland Kitchen apixaban (ELIQUIS) 5 MG TABS tablet Take 1 tablet (5 mg total) by mouth 2 (two) times daily. 60 tablet 6  . atorvastatin (LIPITOR) 40 MG tablet Take 40 mg by mouth every evening.     . Cholecalciferol (VITAMIN D-3 PO) Take 1 capsule by mouth daily with breakfast.    . cyclobenzaprine (FEXMID) 7.5 MG tablet Take 1 tablet (7.5 mg total) by mouth 3 (three) times daily  as needed for muscle spasms. 60 tablet 2  . dexamethasone (DECADRON) 4 MG tablet Take 2 tablets (8 mg) daily for 3 days after chemotherapy. Take with food. 30 tablet 1  . diltiazem (CARDIZEM CD) 180 MG 24 hr capsule Take 1 capsule (180 mg total) by mouth daily. 30 capsule 1  . DULoxetine (CYMBALTA) 60 MG capsule TAKE ONE CAPSULE BY MOUTH DAILY 30 capsule 3  . lidocaine-prilocaine (EMLA) cream Apply to affected area once 30 g 3  . loperamide (IMODIUM A-D) 2 MG tablet Take 2 at diarrhea onset, then 1 every 2hr until 12hrs with no BM. May take 2 every 4hrs at night. If diarrhea recurs repeat. 100 tablet 1  . LORazepam (ATIVAN) 0.5 MG tablet Take 1 tablet (0.5 mg total) by mouth every 6 (six) hours as needed (Nausea or vomiting). 30 tablet 0  . magnesium oxide (MAG-OX) 400 MG tablet Take 1 tablet (400 mg total) by mouth 2 (two) times daily.    . metoprolol tartrate (LOPRESSOR) 50 MG tablet Take 1 tablet (50 mg  total) by mouth 2 (two) times daily. 60 tablet 1  . oxybutynin (DITROPAN-XL) 10 MG 24 hr tablet Take 10 mg by mouth at bedtime.    . pantoprazole (PROTONIX) 40 MG tablet Take 1 tablet (40 mg total) by mouth 2 (two) times daily. 60 tablet 1  . potassium chloride (KLOR-CON) 10 MEQ tablet Take 2 tablets (20 mEq total) by mouth daily.    . prochlorperazine (COMPAZINE) 10 MG tablet Take 1 tablet (10 mg total) by mouth every 6 (six) hours as needed (Nausea or vomiting). (Patient taking differently: Take 10 mg by mouth as needed (Nausea or vomiting). ) 30 tablet 1  . pyridoxine (B-6) 100 MG tablet Take 200 mg by mouth daily with breakfast.     . sodium bicarbonate 650 MG tablet Take 2 tablets (1,300 mg total) by mouth 2 (two) times daily. 120 tablet 1   No current facility-administered medications on file prior to visit.    LABS/IMAGING: Results for orders placed or performed in visit on 03/10/20 (from the past 48 hour(s))  Iron and TIBC     Status: Abnormal   Collection Time: 03/10/20  8:54 AM  Result Value Ref Range   Iron 86 42 - 163 ug/dL   TIBC 178 (L) 202 - 409 ug/dL   Saturation Ratios 48 20 - 55 %   UIBC 92 (L) 117 - 376 ug/dL    Comment: Performed at [facility], [place] [address] [place], [place] [address]  Ferritin     Status: Abnormal   Collection Time: 03/10/20  8:54 AM  Result Value Ref Range   Ferritin 2,216 (H) 24 - 336 ng/mL    Comment: Performed at [facility], [place] [address] [place], [place] [address]  Reticulocytes     Status: Abnormal   Collection Time: 03/10/20  8:54 AM  Result Value Ref Range   Retic Ct Pct 1.8 0.4 - 3.1 %   RBC. 3.25 (L) 4.22 - 5.81 MIL/uL   Retic Count, Absolute 58.5 19.0 - 186.0 K/uL   Immature Retic Fract 16.2 (H) 2.3 - 15.9 %    Comment: Performed at [facility] at [facility], [address], [place], [place] [address]  CMP ([place] only)     Status: Abnormal    Collection Time: 03/10/20  8:54 AM  Result Value Ref Range   Sodium 135 135 -  145 mmol/L   Potassium 3.3 (L) 3.5 - 5.1 mmol/L   Chloride 100 98 - 111 mmol/L   CO2 21 (L) 22 - 32 mmol/L   Glucose, Bld 209 (H) 70 - 99 mg/dL    Comment: Glucose reference range applies only to samples taken after fasting for at least 8 hours.   BUN 36 (H) 8 - 23 mg/dL   Creatinine 5.31 (HH) 0.61 - 1.24 mg/dL    Comment: CRITICAL RESULT CALLED TO, READ BACK BY AND VERIFIED WITH: Sheria Lang B. RN  9:29 AM  03/10/20  SDD    Calcium 9.1 8.9 - 10.3 mg/dL   Total Protein 7.0 6.5 - 8.1 g/dL   Albumin 3.5 3.5 - 5.0 g/dL   AST 24 15 - 41 U/L   ALT 17 0 - 44 U/L   Alkaline Phosphatase 104 38 - 126 U/L   Total Bilirubin 0.4 0.3 - 1.2 mg/dL   GFR, Estimated 11 (L) >60 mL/min    Comment: (NOTE) Calculated using the CKD-EPI Creatinine Equation (2021)    Anion gap 14 5 - 15    Comment: Performed at [facility] at [facility], [address], [place], [place] [address]  CBC with Differential ([place] Only)     Status: Abnormal   Collection Time: 03/10/20  8:54 AM  Result Value Ref Range   WBC Count 15.0 (H) 4.0 - 10.5 K/uL   RBC 3.26 (L) 4.22 - 5.81 MIL/uL   Hemoglobin 10.3 (L) 13.0 - 17.0 g/dL   HCT 32.1 (L) 39 - 52 %   MCV 98.5 80.0 - 100.0 fL   MCH 31.6 26.0 - 34.0 pg   MCHC 32.1 30.0 - 36.0 g/dL   RDW 15.9 (H) 11.5 - 15.5 %   Platelet Count 169 150 - 400 K/uL   nRBC 0.0 0.0 - 0.2 %   Neutrophils Relative % 76 %   Neutro Abs 11.5 (H) 1.7 - 7.7 K/uL   Lymphocytes Relative 11 %   Lymphs Abs 1.7 0.7 - 4.0 K/uL   Monocytes Relative 10 %   Monocytes Absolute 1.5 (H) 0.1 - 1.0 K/uL   Eosinophils Relative 0 %   Eosinophils Absolute 0.0 0.0 - 0.5 K/uL   Basophils Relative 1 %   Basophils Absolute 0.1 0.0 - 0.1 K/uL   Immature Granulocytes 2 %   Abs Immature Granulocytes 0.26 (H) 0.00 - 0.07 K/uL    Comment: Performed at [facility] at [facility],  [address] [place], [place] [address]  Phosphorus     Status: None   Collection Time: 03/10/20  8:54 AM  Result Value Ref Range   Phosphorus 3.5 2.5 - 4.6 mg/dL    Comment: Performed at [facility], [address] [place], [place] [address]  Magnesium     Status: Abnormal   Collection Time: 03/10/20  8:54 AM  Result Value Ref Range   Magnesium 1.2 (L) 1.7 - 2.4 mg/dL    Comment: Performed at [facility], [place] [place], [place] [address]   No results found.  LIPID PANEL:    Component Value Date/Time   CHOL 159 10/05/2018 0411   CHOL 163 10/11/2016 0749   TRIG 131 10/05/2018 0411   TRIG 116 10/11/2016 0749   HDL 47 10/05/2018 0411   HDL 39 (L) 10/11/2016 0749   CHOLHDL 3.4 10/05/2018 0411   VLDL 26  10/05/2018 0411   LDLCALC 86 10/05/2018 0411   LDLCALC 101 (H) 10/11/2016 0749     WEIGHTS: Wt Readings from Last 3 Encounters:  03/11/20 247 lb (112 kg)  03/10/20 250 lb (113.4 kg)  02/25/20 258 lb 4.8 oz (117.2 kg)    VITALS: BP 101/66   Pulse 74   Temp (!) 96.8 F (36 C)   Ht $R'6\' 2"'hb$  (1.88 m)   Wt 247 lb (112 kg)   SpO2 97%   BMI 31.71 kg/m   EXAM: General appearance: alert, no distress and in wheelchair Neck: no carotid bruit, no JVD and thyroid not enlarged, symmetric, no tenderness/mass/nodules Lungs: clear to auscultation bilaterally Heart: irregularly irregular rhythm Abdomen: soft, non-tender; bowel sounds normal; no masses,  no organomegaly Extremities: extremities normal, atraumatic, no cyanosis or edema Pulses: 2+ and symmetric Skin: Skin color, texture, turgor normal. No rashes or lesions Neurologic: Grossly normal Psych: Pleasant  EKG: Deferred  ASSESSMENT: 1. Paroxysmal atrial fibrillation-CHADSVASC score 3, on Eliquis, previously on dofetilide but discontinued due to renal failure 2. Metastatic bladder cancer 3. Anemia and thrombocytopenia related to chemotherapy 4. Recent MSSA sepsis without evidence of  endocarditis or left atrial appendage thrombus 5. Normal LV function with moderate mitral regurgitation (06/2018) 6. CKD 3  PLAN: 1.   Mr. Doro has rate controlled atrial fibrillation on Eliquis but reports RVR with exertion and says that he does not feel well in A. fib.  He felt better when he was in rhythm for brief periods of time on dofetilide.  Because of renal function this was discontinued.  And we may need to investigate other antiarrhythmic such as amiodarone.  I had like to refer him to EP for a discussion based on this since he is complicated and as to whether or not he might be a candidate for ablation.  I suspect due to his thrombocytopenia, anemia and chronic kidney disease he is not a good candidate for that.  Follow-up with me in 6 months.  Pixie Casino, MD, [facility], [place] Director of the [facility]  [facility] Diplomate of the American Board of Clinical Lipidology Attending Cardiologist  Direct Dial: [phone]  Fax: [phone]  Website:  [email or web address] 03/11/2020, 9:19 AM

## 2020-03-12 DIAGNOSIS — E559 Vitamin D deficiency, unspecified: Secondary | ICD-10-CM | POA: Diagnosis not present

## 2020-03-12 DIAGNOSIS — K922 Gastrointestinal hemorrhage, unspecified: Secondary | ICD-10-CM | POA: Diagnosis not present

## 2020-03-12 DIAGNOSIS — N2581 Secondary hyperparathyroidism of renal origin: Secondary | ICD-10-CM | POA: Diagnosis not present

## 2020-03-12 DIAGNOSIS — I480 Paroxysmal atrial fibrillation: Secondary | ICD-10-CM | POA: Diagnosis not present

## 2020-03-12 DIAGNOSIS — E876 Hypokalemia: Secondary | ICD-10-CM | POA: Diagnosis not present

## 2020-03-12 DIAGNOSIS — C679 Malignant neoplasm of bladder, unspecified: Secondary | ICD-10-CM | POA: Diagnosis not present

## 2020-03-12 DIAGNOSIS — N1832 Chronic kidney disease, stage 3b: Secondary | ICD-10-CM | POA: Diagnosis not present

## 2020-03-12 DIAGNOSIS — N179 Acute kidney failure, unspecified: Secondary | ICD-10-CM | POA: Diagnosis not present

## 2020-03-12 DIAGNOSIS — I129 Hypertensive chronic kidney disease with stage 1 through stage 4 chronic kidney disease, or unspecified chronic kidney disease: Secondary | ICD-10-CM | POA: Diagnosis not present

## 2020-03-13 DIAGNOSIS — D62 Acute posthemorrhagic anemia: Secondary | ICD-10-CM | POA: Diagnosis not present

## 2020-03-13 DIAGNOSIS — R195 Other fecal abnormalities: Secondary | ICD-10-CM | POA: Diagnosis not present

## 2020-03-14 DIAGNOSIS — D61818 Other pancytopenia: Secondary | ICD-10-CM | POA: Diagnosis not present

## 2020-03-14 DIAGNOSIS — Z96653 Presence of artificial knee joint, bilateral: Secondary | ICD-10-CM | POA: Diagnosis not present

## 2020-03-14 DIAGNOSIS — Z96642 Presence of left artificial hip joint: Secondary | ICD-10-CM | POA: Diagnosis not present

## 2020-03-14 DIAGNOSIS — D630 Anemia in neoplastic disease: Secondary | ICD-10-CM | POA: Diagnosis not present

## 2020-03-14 DIAGNOSIS — R7303 Prediabetes: Secondary | ICD-10-CM | POA: Diagnosis not present

## 2020-03-14 DIAGNOSIS — I1311 Hypertensive heart and chronic kidney disease without heart failure, with stage 5 chronic kidney disease, or end stage renal disease: Secondary | ICD-10-CM | POA: Diagnosis not present

## 2020-03-14 DIAGNOSIS — C679 Malignant neoplasm of bladder, unspecified: Secondary | ICD-10-CM | POA: Diagnosis not present

## 2020-03-14 DIAGNOSIS — C772 Secondary and unspecified malignant neoplasm of intra-abdominal lymph nodes: Secondary | ICD-10-CM | POA: Diagnosis not present

## 2020-03-14 DIAGNOSIS — D631 Anemia in chronic kidney disease: Secondary | ICD-10-CM | POA: Diagnosis not present

## 2020-03-14 DIAGNOSIS — E785 Hyperlipidemia, unspecified: Secondary | ICD-10-CM | POA: Diagnosis not present

## 2020-03-14 DIAGNOSIS — Z9181 History of falling: Secondary | ICD-10-CM | POA: Diagnosis not present

## 2020-03-14 DIAGNOSIS — Z8546 Personal history of malignant neoplasm of prostate: Secondary | ICD-10-CM | POA: Diagnosis not present

## 2020-03-14 DIAGNOSIS — N186 End stage renal disease: Secondary | ICD-10-CM | POA: Diagnosis not present

## 2020-03-14 DIAGNOSIS — R197 Diarrhea, unspecified: Secondary | ICD-10-CM | POA: Diagnosis not present

## 2020-03-14 DIAGNOSIS — R627 Adult failure to thrive: Secondary | ICD-10-CM | POA: Diagnosis not present

## 2020-03-14 DIAGNOSIS — Z87891 Personal history of nicotine dependence: Secondary | ICD-10-CM | POA: Diagnosis not present

## 2020-03-14 DIAGNOSIS — I4891 Unspecified atrial fibrillation: Secondary | ICD-10-CM | POA: Diagnosis not present

## 2020-03-14 DIAGNOSIS — Z7901 Long term (current) use of anticoagulants: Secondary | ICD-10-CM | POA: Diagnosis not present

## 2020-03-14 DIAGNOSIS — M6281 Muscle weakness (generalized): Secondary | ICD-10-CM | POA: Diagnosis not present

## 2020-03-17 ENCOUNTER — Inpatient Hospital Stay: Payer: PPO

## 2020-03-17 ENCOUNTER — Telehealth: Payer: Self-pay

## 2020-03-17 NOTE — Telephone Encounter (Signed)
Pt had called and left a message 12/3 to verify 12/6/ appts.  Pt call was returned and per [name] the 03/17/20 appts are old and he is to keep the 12/20 appts along with the PET on 12/14/121... AOM

## 2020-03-18 DIAGNOSIS — D631 Anemia in chronic kidney disease: Secondary | ICD-10-CM | POA: Diagnosis not present

## 2020-03-18 DIAGNOSIS — C772 Secondary and unspecified malignant neoplasm of intra-abdominal lymph nodes: Secondary | ICD-10-CM | POA: Diagnosis not present

## 2020-03-18 DIAGNOSIS — E785 Hyperlipidemia, unspecified: Secondary | ICD-10-CM | POA: Diagnosis not present

## 2020-03-18 DIAGNOSIS — Z9181 History of falling: Secondary | ICD-10-CM | POA: Diagnosis not present

## 2020-03-18 DIAGNOSIS — I1311 Hypertensive heart and chronic kidney disease without heart failure, with stage 5 chronic kidney disease, or end stage renal disease: Secondary | ICD-10-CM | POA: Diagnosis not present

## 2020-03-18 DIAGNOSIS — Z8546 Personal history of malignant neoplasm of prostate: Secondary | ICD-10-CM | POA: Diagnosis not present

## 2020-03-18 DIAGNOSIS — M6281 Muscle weakness (generalized): Secondary | ICD-10-CM | POA: Diagnosis not present

## 2020-03-18 DIAGNOSIS — Z96642 Presence of left artificial hip joint: Secondary | ICD-10-CM | POA: Diagnosis not present

## 2020-03-18 DIAGNOSIS — C679 Malignant neoplasm of bladder, unspecified: Secondary | ICD-10-CM | POA: Diagnosis not present

## 2020-03-18 DIAGNOSIS — Z96653 Presence of artificial knee joint, bilateral: Secondary | ICD-10-CM | POA: Diagnosis not present

## 2020-03-18 DIAGNOSIS — D630 Anemia in neoplastic disease: Secondary | ICD-10-CM | POA: Diagnosis not present

## 2020-03-18 DIAGNOSIS — N186 End stage renal disease: Secondary | ICD-10-CM | POA: Diagnosis not present

## 2020-03-18 DIAGNOSIS — R627 Adult failure to thrive: Secondary | ICD-10-CM | POA: Diagnosis not present

## 2020-03-18 DIAGNOSIS — Z7901 Long term (current) use of anticoagulants: Secondary | ICD-10-CM | POA: Diagnosis not present

## 2020-03-18 DIAGNOSIS — R7303 Prediabetes: Secondary | ICD-10-CM | POA: Diagnosis not present

## 2020-03-18 DIAGNOSIS — D61818 Other pancytopenia: Secondary | ICD-10-CM | POA: Diagnosis not present

## 2020-03-18 DIAGNOSIS — I4891 Unspecified atrial fibrillation: Secondary | ICD-10-CM | POA: Diagnosis not present

## 2020-03-18 DIAGNOSIS — R197 Diarrhea, unspecified: Secondary | ICD-10-CM | POA: Diagnosis not present

## 2020-03-18 DIAGNOSIS — Z87891 Personal history of nicotine dependence: Secondary | ICD-10-CM | POA: Diagnosis not present

## 2020-03-24 ENCOUNTER — Telehealth: Payer: Self-pay | Admitting: *Deleted

## 2020-03-24 ENCOUNTER — Other Ambulatory Visit: Payer: Self-pay

## 2020-03-24 ENCOUNTER — Encounter: Payer: Self-pay | Admitting: Internal Medicine

## 2020-03-24 ENCOUNTER — Telehealth (INDEPENDENT_AMBULATORY_CARE_PROVIDER_SITE_OTHER): Payer: PPO | Admitting: Internal Medicine

## 2020-03-24 VITALS — BP 127/78 | HR 81 | Ht 74.0 in | Wt 255.0 lb

## 2020-03-24 DIAGNOSIS — I4819 Other persistent atrial fibrillation: Secondary | ICD-10-CM | POA: Diagnosis not present

## 2020-03-24 DIAGNOSIS — I10 Essential (primary) hypertension: Secondary | ICD-10-CM

## 2020-03-24 DIAGNOSIS — D6869 Other thrombophilia: Secondary | ICD-10-CM | POA: Diagnosis not present

## 2020-03-24 MED ORDER — AMIODARONE HCL 200 MG PO TABS: 200.0000 mg | ORAL_TABLET | Freq: Two times a day (BID) | ORAL | 3 refills | Status: DC

## 2020-03-24 NOTE — Telephone Encounter (Signed)
-----   Message from Thompson Grayer, MD sent at 03/24/2020 10:03 AM EST ----- Otila Kluver,  Start amiodarone $RemoveBeforeDE'200mg'[id number]$  BID   [place], Please get him in to the AF clinic in 3-4 weeks

## 2020-03-24 NOTE — Telephone Encounter (Signed)
Placed order and verified pharmacy with patient.

## 2020-03-24 NOTE — Progress Notes (Signed)
Electrophysiology TeleHealth Note  Due to national recommendations of social distancing due to [place] 19, an audio telehealth visit is felt to be most appropriate for this patient at this time.  Verbal consent was obtained by me for the telehealth visit today.  The patient does not have capability for a virtual visit.  A phone visit is therefore required today.   Date:  03/24/2020   ID:  [name] Rose, DOB [birth year], MRN [id number]  Location: patient's home  Provider location:  [place]   Evaluation Performed: Follow-up visit  PCP:  [name] Frees, MD   Electrophysiologist:  Dr Rayann Heman  Chief Complaint:  palpitations  History of Present Illness:    [name] Rose is a 69 y.o. male who presents via telehealth conferencing today.  The patient has been recently progressively ill.  He has bladder cancer and has been receiving chemotherapy for 3 years.  He has advanced renal failure.  He is not on dialysis. He fell in November and was admitted to [facility] for 3 weeks.  He was found to have profound anemia.  His tikosyn was discontinued.  His afib has since returned.  He reports SOB with very little activity.  Today, he denies symptoms of palpitations, chest pain,  lower extremity edema, dizziness, presyncope, or syncope.  The patient is otherwise without complaint today.     Past Medical History:  Diagnosis Date  . Anemia   . Arthritis   . 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 ([place]) 09/29/2016  . Bladder tumor   . Chronic renal insufficiency    stage IV  . Dyspnea    with exertion  . Essential hypertension 06/23/2018  . Goals of care, counseling/discussion 09/30/2016  . History of prostate cancer followed by pcp dr [name] Rose-  per pt last PSA undetectable   dx 2008-- (Stage T1c, Gleason 3+3,  PSA 4.58, vol 99cc)  s/p  radical prostatectomy (nerve sparing bilateral)   . Hyperglycemia 06/23/2018  . Hypertension   . Mild hyperlipidemia 06/23/2018  .  Persistent atrial fibrillation (HCC)    atrial fib  . Pre-diabetes   . Wears glasses     Past Surgical History:  Procedure Laterality Date  . CARDIOVERSION N/A 05/18/2019   Procedure: CARDIOVERSION;  Surgeon: [name] Perla, MD;  Location: [facility] ENDOSCOPY;  Service: Cardiovascular;  Laterality: N/A;  . CARDIOVERSION N/A 09/27/2019   Procedure: CARDIOVERSION;  Surgeon: [name] Heinz, MD;  Location: [place];  Service: Cardiovascular;  Laterality: N/A;  . CATARACT EXTRACTION W/ INTRAOCULAR LENS  IMPLANT, BILATERAL Bilateral 2011  . [place]  . ESOPHAGOGASTRODUODENOSCOPY N/A 02/19/2020   Procedure: ESOPHAGOGASTRODUODENOSCOPY (EGD);  Surgeon: [name] Corner, MD;  Location: [place];  Service: Gastroenterology;  Laterality: N/A;  . GIVENS CAPSULE STUDY N/A 02/19/2020   Procedure: GIVENS CAPSULE STUDY;  Surgeon: [name] Corner, MD;  Location: [place];  Service: Gastroenterology;  Laterality: N/A;  . IR FLUORO GUIDE PORT INSERTION RIGHT  10/07/2016  . IR RADIOLOGIST EVAL & MGMT  01/05/2018  . IR US GUIDE VASC ACCESS RIGHT  10/07/2016  . KNEE ARTHROSCOPY Bilateral right 2006;  left 02-15-2007  . [place];  1990;  1983  . ROBOT ASSISTED LAPAROSCOPIC RADICAL PROSTATECTOMY  06/20/2006   bilateral nerve sparing  . TEE WITHOUT CARDIOVERSION N/A 06/27/2018   Procedure: TRANSESOPHAGEAL ECHOCARDIOGRAM (TEE);  Surgeon: [name] Mormon, MD;  Location: [facility] ENDOSCOPY;  Service: Cardiovascular;  Laterality: N/A;  . TOTAL HIP ARTHROPLASTY  Left 03/03/2015   Procedure: LEFT TOTAL HIP ARTHROPLASTY ANTERIOR APPROACH;  Surgeon: [name] Geralds, MD;  Location: [facility] OR;  Service: Orthopedics;  Laterality: Left;  . TOTAL KNEE ARTHROPLASTY Bilateral left 08-13-2009;  right 12-26-2009  . TRANSURETHRAL RESECTION OF BLADDER TUMOR N/A 09/20/2016   Procedure: TRANSURETHRAL RESECTION OF BLADDER TUMOR (TURBT);  Surgeon: [name] Matar, MD;  Location: [facility];  Service: Urology;  Laterality: N/A;  . TRANSURETHRAL RESECTION OF BLADDER TUMOR WITH MITOMYCIN-C N/A 08/06/2019   Procedure: TRANSURETHRAL RESECTION OF BLADDER TUMOR;  Surgeon: [name] Matar, MD;  [facility];  Service: Urology;  Laterality: N/A;    Current Outpatient Medications  Medication Sig Dispense Refill  . acetaminophen (TYLENOL) 500 MG tablet Take 1,000 mg by mouth every 6 (six) hours as needed for mild pain or headache.    Marland Kitchen apixaban (ELIQUIS) 5 MG TABS tablet Take 1 tablet (5 mg total) by mouth 2 (two) times daily. 60 tablet 6  . atorvastatin (LIPITOR) 40 MG tablet Take 40 mg by mouth every evening.     . Cholecalciferol (D3-50) 1.25 MG (50000 UT) capsule Take 50,000 Units by mouth once a week.    . cyclobenzaprine (FEXMID) 7.5 MG tablet Take 1 tablet (7.5 mg total) by mouth 3 (three) times daily as needed for muscle spasms. 60 tablet 2  . dexamethasone (DECADRON) 4 MG tablet Take 2 tablets (8 mg) daily for 3 days after chemotherapy. Take with food. 30 tablet 1  . diltiazem (CARDIZEM CD) 180 MG 24 hr capsule Take 1 capsule (180 mg total) by mouth daily. 30 capsule 1  . DULoxetine (CYMBALTA) 60 MG capsule TAKE ONE CAPSULE BY MOUTH DAILY 30 capsule 3  . famotidine (PEPCID) 20 MG tablet Take 20 mg by mouth 2 (two) times daily.    Marland Kitchen lidocaine-prilocaine (EMLA) cream Apply to affected area once 30 g 3  . loperamide (IMODIUM A-D) 2 MG tablet Take 2 at diarrhea onset, then 1 every 2hr until 12hrs with no BM. May take 2 every 4hrs at night. If diarrhea recurs repeat. 100 tablet 1  . magnesium oxide (MAG-OX) 400 MG tablet Take 1 tablet (400 mg total) by mouth 2 (two) times daily.    . metoprolol tartrate (LOPRESSOR) 25 MG tablet Take 25 mg by mouth 2 (two) times daily.    Marland Kitchen oxybutynin (DITROPAN-XL) 10 MG 24 hr tablet Take 10 mg by mouth at bedtime.    . potassium chloride (KLOR-CON) 10 MEQ tablet Take 2 tablets (20 mEq total) by mouth daily.    .  prochlorperazine (COMPAZINE) 10 MG tablet Take 1 tablet (10 mg total) by mouth every 6 (six) hours as needed (Nausea or vomiting). 30 tablet 1  . pyridoxine (B-6) 100 MG tablet Take 200 mg by mouth daily with breakfast.     . sodium bicarbonate 650 MG tablet Take 2 tablets (1,300 mg total) by mouth 2 (two) times daily. 120 tablet 1   No current facility-administered medications for this visit.    Allergies:   Patient has no known allergies.   Social History:  The patient  reports that he quit smoking about 35 years ago. His smoking use included cigarettes. He has a 16.00 pack-year smoking history. He has never used smokeless tobacco. He reports current alcohol use. He reports current drug use. Drug: Marijuana.   ROS:  Please see the history of present illness.   All other systems are personally reviewed and negative.    Exam:  Vital Signs:  BP 127/78   Pulse 81   Ht $R'6\' 2"'sz$  (1.88 m)   Wt 255 lb (115.7 kg)   BMI 32.74 kg/m   Well sounding, alert and conversant   Labs/Other Tests and Data Reviewed:    Recent Labs: 03/10/2020: ALT 17; BUN 36; Creatinine 5.31; Hemoglobin 10.3; Magnesium 1.2; Platelet Count 169; Potassium 3.3; Sodium 135   Wt Readings from Last 3 Encounters:  03/24/20 255 lb (115.7 kg)  03/11/20 247 lb (112 kg)  03/10/20 250 lb (113.4 kg)     ASSESSMENT & PLAN:    1.  Persistent atrial fibrillation The patient has symptomatic, recurrent persistent atrial fibrillation. he has failed medical therapy with tikosyn due to electrolyte abnormalities and renal failure. Chads2vasc score is 3.  he is anticoagulated with eliquis . 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 He is not a candidate for ablation currently.  We discussed options of rate control and rhythm control at length.  He would like to pursue sinus.  I have therefore advised amiodarone as his only AAD option given his renal failure. He understands that risk include but are not limited to  thyroid, liver, lung and eye toxicity and possibly even death and wishes to proceed with initiation. Start amiodarone $RemoveBeforeDE'200mg'vfrXtpGSxMtBFdu$  BID Follow-up in 3-4 weeks in the AF clinic to arrange cardioversion if still in AF. He will need close follow-up with Dr Debara Pickett and the AF clinic going forward to avoid toxicity with this medicine.  2. HTN Stable No change required today  3. Obesity Body mass index is 32.74 kg/m. lifestyle modification is advised  4. SOB Likely largely due to stage IV renal failure in addition to other comorbidities I am not convinced that afib is the driving issue here.   Risks, benefits and potential toxicities for medications prescribed and/or refilled reviewed with patient today.    Patient Risk:  after full review of this patients clinical status, I feel that they are at moderate risk at this time.  Today, I have spent 22 minutes with the patient with telehealth technology discussing arrhythmia management .    Army Fossa, MD  03/24/2020 10:00 AM     [facility] [place] [address] [phone] (office) [phone] (fax)

## 2020-03-25 ENCOUNTER — Ambulatory Visit (HOSPITAL_COMMUNITY): Admission: RE | Admit: 2020-03-25 | Payer: PPO | Source: Ambulatory Visit

## 2020-03-25 DIAGNOSIS — D630 Anemia in neoplastic disease: Secondary | ICD-10-CM | POA: Diagnosis not present

## 2020-03-25 DIAGNOSIS — D61818 Other pancytopenia: Secondary | ICD-10-CM | POA: Diagnosis not present

## 2020-03-25 DIAGNOSIS — I4891 Unspecified atrial fibrillation: Secondary | ICD-10-CM | POA: Diagnosis not present

## 2020-03-25 DIAGNOSIS — N184 Chronic kidney disease, stage 4 (severe): Secondary | ICD-10-CM | POA: Diagnosis not present

## 2020-03-25 DIAGNOSIS — N186 End stage renal disease: Secondary | ICD-10-CM | POA: Diagnosis not present

## 2020-03-25 DIAGNOSIS — I10 Essential (primary) hypertension: Secondary | ICD-10-CM | POA: Diagnosis not present

## 2020-03-25 DIAGNOSIS — I480 Paroxysmal atrial fibrillation: Secondary | ICD-10-CM | POA: Diagnosis not present

## 2020-03-25 DIAGNOSIS — D62 Acute posthemorrhagic anemia: Secondary | ICD-10-CM | POA: Diagnosis not present

## 2020-03-25 DIAGNOSIS — I1311 Hypertensive heart and chronic kidney disease without heart failure, with stage 5 chronic kidney disease, or end stage renal disease: Secondary | ICD-10-CM | POA: Diagnosis not present

## 2020-03-25 DIAGNOSIS — N183 Chronic kidney disease, stage 3 unspecified: Secondary | ICD-10-CM | POA: Diagnosis not present

## 2020-03-25 DIAGNOSIS — E7800 Pure hypercholesterolemia, unspecified: Secondary | ICD-10-CM | POA: Diagnosis not present

## 2020-03-25 DIAGNOSIS — Z8546 Personal history of malignant neoplasm of prostate: Secondary | ICD-10-CM | POA: Diagnosis not present

## 2020-03-28 ENCOUNTER — Other Ambulatory Visit: Payer: Self-pay

## 2020-03-28 ENCOUNTER — Ambulatory Visit (HOSPITAL_COMMUNITY)
Admission: RE | Admit: 2020-03-28 | Discharge: 2020-03-28 | Disposition: A | Discharge status: Home / Self Care | Payer: PPO | Source: Ambulatory Visit | Attending: Hematology & Oncology | Admitting: Hematology & Oncology

## 2020-03-28 DIAGNOSIS — C772 Secondary and unspecified malignant neoplasm of intra-abdominal lymph nodes: Secondary | ICD-10-CM | POA: Diagnosis not present

## 2020-03-28 DIAGNOSIS — C679 Malignant neoplasm of bladder, unspecified: Secondary | ICD-10-CM | POA: Insufficient documentation

## 2020-03-28 DIAGNOSIS — R590 Localized enlarged lymph nodes: Secondary | ICD-10-CM | POA: Diagnosis not present

## 2020-03-28 DIAGNOSIS — D494 Neoplasm of unspecified behavior of bladder: Secondary | ICD-10-CM | POA: Diagnosis not present

## 2020-03-28 LAB — GLUCOSE, CAPILLARY: Glucose-Capillary: 132 mg/dL — ABNORMAL HIGH (ref 70–99)

## 2020-03-28 MED ORDER — FLUDEOXYGLUCOSE F - 18 (FDG) INJECTION
12.8200 | Freq: Once | INTRAVENOUS | Status: AC | PRN
  Administered 2020-03-28: 10:00:00 12.82 via INTRAVENOUS

## 2020-03-31 ENCOUNTER — Telehealth: Payer: Self-pay | Admitting: *Deleted

## 2020-03-31 ENCOUNTER — Inpatient Hospital Stay (HOSPITAL_BASED_OUTPATIENT_CLINIC_OR_DEPARTMENT_OTHER): Payer: PPO | Admitting: Hematology & Oncology

## 2020-03-31 ENCOUNTER — Other Ambulatory Visit: Payer: Self-pay

## 2020-03-31 ENCOUNTER — Encounter: Payer: Self-pay | Admitting: Hematology & Oncology

## 2020-03-31 ENCOUNTER — Inpatient Hospital Stay: Payer: PPO | Attending: Hematology & Oncology

## 2020-03-31 ENCOUNTER — Telehealth: Payer: Self-pay | Admitting: Physician Assistant

## 2020-03-31 ENCOUNTER — Inpatient Hospital Stay: Payer: PPO

## 2020-03-31 VITALS — BP 121/70 | HR 86 | Temp 97.8°F | Resp 17 | Wt 251.0 lb

## 2020-03-31 DIAGNOSIS — C679 Malignant neoplasm of bladder, unspecified: Secondary | ICD-10-CM | POA: Diagnosis not present

## 2020-03-31 DIAGNOSIS — N179 Acute kidney failure, unspecified: Secondary | ICD-10-CM | POA: Diagnosis not present

## 2020-03-31 DIAGNOSIS — D631 Anemia in chronic kidney disease: Secondary | ICD-10-CM | POA: Diagnosis not present

## 2020-03-31 DIAGNOSIS — C772 Secondary and unspecified malignant neoplasm of intra-abdominal lymph nodes: Secondary | ICD-10-CM

## 2020-03-31 DIAGNOSIS — D630 Anemia in neoplastic disease: Secondary | ICD-10-CM | POA: Diagnosis not present

## 2020-03-31 DIAGNOSIS — I4891 Unspecified atrial fibrillation: Secondary | ICD-10-CM | POA: Diagnosis not present

## 2020-03-31 DIAGNOSIS — N186 End stage renal disease: Secondary | ICD-10-CM | POA: Diagnosis not present

## 2020-03-31 DIAGNOSIS — K5780 Diverticulitis of intestine, part unspecified, with perforation and abscess without bleeding: Secondary | ICD-10-CM | POA: Diagnosis not present

## 2020-03-31 DIAGNOSIS — Z8546 Personal history of malignant neoplasm of prostate: Secondary | ICD-10-CM | POA: Diagnosis not present

## 2020-03-31 DIAGNOSIS — Z87891 Personal history of nicotine dependence: Secondary | ICD-10-CM | POA: Diagnosis not present

## 2020-03-31 DIAGNOSIS — I1311 Hypertensive heart and chronic kidney disease without heart failure, with stage 5 chronic kidney disease, or end stage renal disease: Secondary | ICD-10-CM | POA: Diagnosis not present

## 2020-03-31 DIAGNOSIS — Z9181 History of falling: Secondary | ICD-10-CM | POA: Diagnosis not present

## 2020-03-31 DIAGNOSIS — D61818 Other pancytopenia: Secondary | ICD-10-CM | POA: Diagnosis not present

## 2020-03-31 DIAGNOSIS — N289 Disorder of kidney and ureter, unspecified: Secondary | ICD-10-CM | POA: Insufficient documentation

## 2020-03-31 DIAGNOSIS — R197 Diarrhea, unspecified: Secondary | ICD-10-CM | POA: Diagnosis not present

## 2020-03-31 DIAGNOSIS — D508 Other iron deficiency anemias: Secondary | ICD-10-CM

## 2020-03-31 DIAGNOSIS — Z79899 Other long term (current) drug therapy: Secondary | ICD-10-CM | POA: Insufficient documentation

## 2020-03-31 DIAGNOSIS — Z96642 Presence of left artificial hip joint: Secondary | ICD-10-CM | POA: Diagnosis not present

## 2020-03-31 DIAGNOSIS — M6281 Muscle weakness (generalized): Secondary | ICD-10-CM | POA: Diagnosis not present

## 2020-03-31 DIAGNOSIS — Z7901 Long term (current) use of anticoagulants: Secondary | ICD-10-CM | POA: Diagnosis not present

## 2020-03-31 DIAGNOSIS — K5900 Constipation, unspecified: Secondary | ICD-10-CM | POA: Insufficient documentation

## 2020-03-31 DIAGNOSIS — Z96653 Presence of artificial knee joint, bilateral: Secondary | ICD-10-CM | POA: Diagnosis not present

## 2020-03-31 DIAGNOSIS — D62 Acute posthemorrhagic anemia: Secondary | ICD-10-CM

## 2020-03-31 DIAGNOSIS — C7889 Secondary malignant neoplasm of other digestive organs: Secondary | ICD-10-CM | POA: Diagnosis not present

## 2020-03-31 DIAGNOSIS — R627 Adult failure to thrive: Secondary | ICD-10-CM | POA: Diagnosis not present

## 2020-03-31 DIAGNOSIS — E785 Hyperlipidemia, unspecified: Secondary | ICD-10-CM | POA: Diagnosis not present

## 2020-03-31 DIAGNOSIS — R7303 Prediabetes: Secondary | ICD-10-CM | POA: Diagnosis not present

## 2020-03-31 LAB — CBC WITH DIFFERENTIAL (CANCER CENTER ONLY)
Abs Immature Granulocytes: 0.08 10*3/uL — ABNORMAL HIGH (ref 0.00–0.07)
Basophils Absolute: 0 10*3/uL (ref 0.0–0.1)
Basophils Relative: 0 %
Eosinophils Absolute: 0.1 10*3/uL (ref 0.0–0.5)
Eosinophils Relative: 1 %
HCT: 28.1 % — ABNORMAL LOW (ref 39.0–52.0)
Hemoglobin: 9.2 g/dL — ABNORMAL LOW (ref 13.0–17.0)
Immature Granulocytes: 1 %
Lymphocytes Relative: 11 %
Lymphs Abs: 1.1 10*3/uL (ref 0.7–4.0)
MCH: 31.8 pg (ref 26.0–34.0)
MCHC: 32.7 g/dL (ref 30.0–36.0)
MCV: 97.2 fL (ref 80.0–100.0)
Monocytes Absolute: 0.9 10*3/uL (ref 0.1–1.0)
Monocytes Relative: 8 %
Neutro Abs: 8.6 10*3/uL — ABNORMAL HIGH (ref 1.7–7.7)
Neutrophils Relative %: 79 %
Platelet Count: 153 10*3/uL (ref 150–400)
RBC: 2.89 MIL/uL — ABNORMAL LOW (ref 4.22–5.81)
RDW: 15.9 % — ABNORMAL HIGH (ref 11.5–15.5)
WBC Count: 10.8 10*3/uL — ABNORMAL HIGH (ref 4.0–10.5)
nRBC: 0 % (ref 0.0–0.2)

## 2020-03-31 LAB — CMP (CANCER CENTER ONLY)
ALT: 21 U/L (ref 0–44)
AST: 22 U/L (ref 15–41)
Albumin: 3.6 g/dL (ref 3.5–5.0)
Alkaline Phosphatase: 91 U/L (ref 38–126)
Anion gap: 11 (ref 5–15)
BUN: 45 mg/dL — ABNORMAL HIGH (ref 8–23)
CO2: 21 mmol/L — ABNORMAL LOW (ref 22–32)
Calcium: 9.2 mg/dL (ref 8.9–10.3)
Chloride: 101 mmol/L (ref 98–111)
Creatinine: 4.64 mg/dL (ref 0.61–1.24)
GFR, Estimated: 13 mL/min — ABNORMAL LOW (ref >60)
Glucose, Bld: 181 mg/dL — ABNORMAL HIGH (ref 70–99)
Potassium: 3.3 mmol/L — ABNORMAL LOW (ref 3.5–5.1)
Sodium: 133 mmol/L — ABNORMAL LOW (ref 135–145)
Total Bilirubin: 0.4 mg/dL (ref 0.3–1.2)
Total Protein: 6.9 g/dL (ref 6.5–8.1)

## 2020-03-31 LAB — PHOSPHORUS: Phosphorus: 3.2 mg/dL (ref 2.5–4.6)

## 2020-03-31 LAB — LACTATE DEHYDROGENASE: LDH: 165 U/L (ref 98–192)

## 2020-03-31 LAB — MAGNESIUM: Magnesium: 1.7 mg/dL (ref 1.7–2.4)

## 2020-03-31 MED ORDER — DARBEPOETIN ALFA 300 MCG/0.6ML IJ SOSY
300.0000 ug | PREFILLED_SYRINGE | Freq: Once | INTRAMUSCULAR | Status: AC
  Administered 2020-03-31: 10:00:00 300 ug via SUBCUTANEOUS

## 2020-03-31 MED ORDER — HEPARIN SOD (PORK) LOCK FLUSH 100 UNIT/ML IV SOLN
500.0000 [IU] | Freq: Once | INTRAVENOUS | Status: AC
  Administered 2020-03-31: 10:00:00 500 [IU] via INTRAVENOUS
  Filled 2020-03-31: qty 5

## 2020-03-31 MED ORDER — SODIUM CHLORIDE 0.9% FLUSH
10.0000 mL | INTRAVENOUS | Status: DC | PRN
  Administered 2020-03-31: 10:00:00 10 mL via INTRAVENOUS
  Filled 2020-03-31: qty 10

## 2020-03-31 MED ORDER — POTASSIUM CHLORIDE ER 10 MEQ PO TBCR: 20.0000 meq | EXTENDED_RELEASE_TABLET | Freq: Every day | ORAL | 3 refills | Status: DC

## 2020-03-31 MED ORDER — DARBEPOETIN ALFA 300 MCG/0.6ML IJ SOSY
PREFILLED_SYRINGE | INTRAMUSCULAR | Status: AC
  Filled 2020-03-31: qty 0.6

## 2020-03-31 NOTE — Telephone Encounter (Signed)
*  STAT* If patient is at the pharmacy, call can be transferred to refill team.   1. Which medications need to be refilled? (please list name of each medication and dose if known) need a new prescription for  Potassium Chloride- Directions and mEq  have changed  2. Which pharmacy/location (including street and city if local pharmacy) is medication to be sent to? [address], [place]  3. Do they need a 30 day or 90 day supply? 90 days and refills

## 2020-03-31 NOTE — Patient Instructions (Signed)
Pt discharged in no apparent distress. Pt left ambulatory with assistance, via W/C. Pt aware of discharge instructions and verbalized understanding and had no further questions.

## 2020-03-31 NOTE — Telephone Encounter (Signed)
Pt's medication was sent to pt's pharmacy as requested. Confirmation received.  °

## 2020-03-31 NOTE — Patient Instructions (Signed)
Implanted Port Insertion, Care After °This sheet gives you information about how to care for yourself after your procedure. Your health care provider may also give you more specific instructions. If you have problems or questions, contact your health care provider. °What can I expect after the procedure? °After the procedure, it is common to have: °· Discomfort at the port insertion site. °· Bruising on the skin over the port. This should improve over 3-4 days. °Follow these instructions at home: °Port care °· After your port is placed, you will get a manufacturer's information card. The card has information about your port. Keep this card with you at all times. °· Take care of the port as told by your health care provider. Ask your health care provider if you or a family member can get training for taking care of the port at home. A home health care nurse may also take care of the port. °· Make sure to remember what type of port you have. °Incision care ° °  ° °· Follow instructions from your health care provider about how to take care of your port insertion site. Make sure you: °? Wash your hands with soap and water before and after you change your bandage (dressing). If soap and water are not available, use hand sanitizer. °? Change your dressing as told by your health care provider. °? Leave stitches (sutures), skin glue, or adhesive strips in place. These skin closures may need to stay in place for 2 weeks or longer. If adhesive strip edges start to loosen and curl up, you may trim the loose edges. Do not remove adhesive strips completely unless your health care provider tells you to do that. °· Check your port insertion site every day for signs of infection. Check for: °? Redness, swelling, or pain. °? Fluid or blood. °? Warmth. °? Pus or a bad smell. °Activity °· Return to your normal activities as told by your health care provider. Ask your health care provider what activities are safe for you. °· Do not  lift anything that is heavier than 10 lb (4.5 kg), or the limit that you are told, until your health care provider says that it is safe. °General instructions °· Take over-the-counter and prescription medicines only as told by your health care provider. °· Do not take baths, swim, or use a hot tub until your health care provider approves. Ask your health care provider if you may take showers. You may only be allowed to take sponge baths. °· Do not drive for 24 hours if you were given a sedative during your procedure. °· Wear a medical alert bracelet in case of an emergency. This will tell any health care providers that you have a port. °· Keep all follow-up visits as told by your health care provider. This is important. °Contact a health care provider if: °· You cannot flush your port with saline as directed, or you cannot draw blood from the port. °· You have a fever or chills. °· You have redness, swelling, or pain around your port insertion site. °· You have fluid or blood coming from your port insertion site. °· Your port insertion site feels warm to the touch. °· You have pus or a bad smell coming from the port insertion site. °Get help right away if: °· You have chest pain or shortness of breath. °· You have bleeding from your port that you cannot control. °Summary °· Take care of the port as told by your health   care provider. Keep the manufacturer's information card with you at all times. °· Change your dressing as told by your health care provider. °· Contact a health care provider if you have a fever or chills or if you have redness, swelling, or pain around your port insertion site. °· Keep all follow-up visits as told by your health care provider. °This information is not intended to replace advice given to you by your health care provider. Make sure you discuss any questions you have with your health care provider. °Document Revised: 10/25/2017 Document Reviewed: 10/25/2017 °Elsevier Patient Education ©  2020 [employer] ° °

## 2020-03-31 NOTE — Telephone Encounter (Signed)
[name] notified of creat-4.64.  No new orders received at this time.

## 2020-03-31 NOTE — Progress Notes (Signed)
Hematology and Oncology Follow Up Visit  [name] Rose [id number] 08-07-1950 69 y.o. 03/31/2020   Principle Diagnosis:  Metastatic high-grade bladder cancer - recurrent Anemia of renal insufficiency Diverticular abscess - E.coli  Past Therapy: Atezolizumab 1200mg  IV q 3 wks - s/p cycle #4 - d/c due to progression. Taxotere 80mg /m2 IV q 3 wks - s/p cycle #3 - d/c due to progression M-VAC - s/p cycle#8 -- d/c on 07/10/2018 due to blood counts Padcev -- s/p cycle #1 on 04/06/2020d/c 08/28/2018  Current Therapy: Trodelvy8 mg/kg IV d1&8 -- s/p cycle #3  -- start on 12/31/2019 Aranesp 300 mcg sq q 3 week for Hgb < 11 Ifosfamide/Gemzar -- s/p cycle #9- started 04/16/2019  -- d/c on 12/21/2019 due to progression IV iron as indicated   Interim History:  [name] Rose is here today for follow-up.  Thankfully, the atrial fibrillation seems to be doing better.  The rate is definitely much better controlled.  He has periods in which it sounds like the rate is pretty regular.  We did do a PET scan on him.  This was done last week.  Unfortunately: It is no surprise that the cancer is becoming more active.  He has activity in a left supraclavicular lymph node which is only 9 mm in size.  The SUV is 9.6.  Also noted is some activity in a right inguinal and right iliac lymph node.  These have been previously noted.  There is some more activity.  He just is not a candidate for any systemic therapy.  His performance status is just not good enough.  He has poor renal function.  The renal function is getting better however.  He is asymptomatic with respect to his cancer.  As such, I think we just have to watch this.  Quality of life really is what our focus needs to be.  I really would like to hope that his quality of life is going to be decent.  I just feel bad that the atrial fibrillation is caused so many issues for him.  He is more anemic.  We will go ahead and give him a dose of  Aranesp today.  I really think this will be helpful.  He has had no bleeding.  His appetite is good.  He has had no nausea or vomiting.  He is urinating.  He has a little bit of constipation.  He has a little bit of a rash or dry skin on his lower legs.  I told him to try some aloe vera lotion to see if this may help.  Overall, I would say his performance status is ECOG 2, at best.  .   Medications:  Allergies as of 03/31/2020   No Known Allergies     Medication List       Accurate as of March 31, 2020  9:52 AM. If you have any questions, ask your nurse or doctor.        acetaminophen 500 MG tablet Commonly known as: TYLENOL Take 1,000 mg by mouth every 6 (six) hours as needed for mild pain or headache.   amiodarone 200 MG tablet Commonly known as: PACERONE Take 1 tablet (200 mg total) by mouth 2 (two) times daily.   apixaban 5 MG Tabs tablet Commonly known as: Eliquis Take 1 tablet (5 mg total) by mouth 2 (two) times daily.   atorvastatin 40 MG tablet Commonly known as: LIPITOR Take 40 mg by mouth every evening.   cyclobenzaprine 7.5 MG  tablet Commonly known as: FEXMID Take 1 tablet (7.5 mg total) by mouth 3 (three) times daily as needed for muscle spasms.   D3-50 1.25 MG (50000 UT) capsule Generic drug: Cholecalciferol Take 50,000 Units by mouth once a week.   dexamethasone 4 MG tablet Commonly known as: DECADRON Take 2 tablets (8 mg) daily for 3 days after chemotherapy. Take with food.   diltiazem 180 MG 24 hr capsule Commonly known as: CARDIZEM CD Take 1 capsule (180 mg total) by mouth daily.   DULoxetine 60 MG capsule Commonly known as: CYMBALTA TAKE ONE CAPSULE BY MOUTH DAILY   famotidine 20 MG tablet Commonly known as: PEPCID Take 20 mg by mouth 2 (two) times daily.   lidocaine-prilocaine cream Commonly known as: EMLA Apply to affected area once   loperamide 2 MG tablet Commonly known as: IMODIUM A-D Take 2 at diarrhea onset, then 1  every 2hr until 12hrs with no BM. May take 2 every 4hrs at night. If diarrhea recurs repeat.   magnesium oxide 400 MG tablet Commonly known as: MAG-OX Take 1 tablet (400 mg total) by mouth 2 (two) times daily.   metoprolol tartrate 25 MG tablet Commonly known as: LOPRESSOR Take 25 mg by mouth 2 (two) times daily.   oxybutynin 10 MG 24 hr tablet Commonly known as: DITROPAN-XL Take 10 mg by mouth at bedtime.   potassium chloride 10 MEQ tablet Commonly known as: KLOR-CON Take 2 tablets (20 mEq total) by mouth daily.   prochlorperazine 10 MG tablet Commonly known as: COMPAZINE Take 1 tablet (10 mg total) by mouth every 6 (six) hours as needed (Nausea or vomiting).   pyridoxine 100 MG tablet Commonly known as: B-6 Take 200 mg by mouth daily with breakfast.   sodium bicarbonate 650 MG tablet Take 2 tablets (1,300 mg total) by mouth 2 (two) times daily.       Allergies: No Known Allergies  Past Medical History, Surgical history, Social history, and Family History were reviewed and updated.  Review of Systems: Review of Systems  Constitutional: Negative.   HENT: Negative.   Eyes: Negative.   Respiratory: Negative.   Cardiovascular: Positive for palpitations.  Gastrointestinal: Positive for nausea.  Genitourinary: Negative.   Musculoskeletal: Positive for myalgias.  Skin: Negative.   Neurological: Positive for tingling.  Endo/Heme/Allergies: Negative.   Psychiatric/Behavioral: Negative.      Physical Exam:  vitals were not taken for this visit.   Wt Readings from Last 3 Encounters:  03/24/20 255 lb (115.7 kg)  03/11/20 247 lb (112 kg)  03/10/20 250 lb (113.4 kg)    Physical Exam Vitals reviewed.  HENT:     Head: Normocephalic and atraumatic.  Eyes:     Pupils: Pupils are equal, round, and reactive to light.  Cardiovascular:     Rate and Rhythm: Tachycardia present. Rhythm irregular.     Heart sounds: Normal heart sounds.     Comments: Cardiac exam is  tachycardic and irregular consistent with the atrial fibrillation.  There are no murmurs. Pulmonary:     Effort: Pulmonary effort is normal.     Breath sounds: Normal breath sounds.  Abdominal:     General: Bowel sounds are normal.     Palpations: Abdomen is soft.  Musculoskeletal:        General: No tenderness or deformity. Normal range of motion.     Cervical back: Normal range of motion.  Lymphadenopathy:     Cervical: No cervical adenopathy.  Skin:    General:  Skin is warm and dry.     Findings: No erythema or rash.  Neurological:     Mental Status: He is alert and oriented to person, place, and time.  Psychiatric:        Behavior: Behavior normal.        Thought Content: Thought content normal.        Judgment: Judgment normal.      Lab Results  Component Value Date   WBC 10.8 (H) 03/31/2020   HGB 9.2 (L) 03/31/2020   HCT 28.1 (L) 03/31/2020   MCV 97.2 03/31/2020   PLT 153 03/31/2020   Lab Results  Component Value Date   FERRITIN 2,216 (H) 03/10/2020   IRON 86 03/10/2020   TIBC 178 (L) 03/10/2020   UIBC 92 (L) 03/10/2020   IRONPCTSAT 48 03/10/2020   Lab Results  Component Value Date   RETICCTPCT 1.8 03/10/2020   RBC 2.89 (L) 03/31/2020   No results found for: KPAFRELGTCHN, LAMBDASER, KAPLAMBRATIO No results found for: IGGSERUM, IGA, IGMSERUM No results found for: Kathrynn Ducking, MSPIKE, SPEI   Chemistry      Component Value Date/Time   NA 133 (L) 03/31/2020 0910   NA 140 12/18/2019 1411   NA 141 02/21/2017 0902   K 3.3 (L) 03/31/2020 0910   K 4.1 02/21/2017 0902   CL 101 03/31/2020 0910   CL 98 02/21/2017 0902   CO2 21 (L) 03/31/2020 0910   CO2 31 02/21/2017 0902   BUN 45 (H) 03/31/2020 0910   BUN 25 12/18/2019 1411   BUN 17 02/21/2017 0902   CREATININE 4.64 (HH) 03/31/2020 0910   CREATININE 1.2 02/21/2017 0902      Component Value Date/Time   CALCIUM 9.2 03/31/2020 0910   CALCIUM 9.2 02/21/2017 0902    ALKPHOS 91 03/31/2020 0910   ALKPHOS 65 02/21/2017 0902   AST 22 03/31/2020 0910   ALT 21 03/31/2020 0910   ALT 18 02/21/2017 0902   BILITOT 0.4 03/31/2020 0910       Impression and Plan: Mr. Kreis is a very pleasant 69yo caucasian gentleman with metastatic high grade bladder cancer.  He is holding his own right now.  I am glad that his quality of life is doing a little bit better.  Again I think his quality of life will be dictated by the atrial fibrillation.  We will give him Aranesp today.  I probably would not plan for another PET scan until February or March.  Unless his performance status gets much better, I just do not see that were going to be able to treat him.  We will go ahead and get him back to see [place] in a month.  I want make sure that we do not let the anemia get out of control.    Volanda Napoleon, MD 12/20/20219:52 AM

## 2020-04-01 LAB — IRON AND TIBC
Iron: 102 ug/dL (ref 42–163)
Saturation Ratios: 50 % (ref 20–55)
TIBC: 205 ug/dL (ref 202–409)
UIBC: 103 ug/dL — ABNORMAL LOW (ref 117–376)

## 2020-04-01 LAB — FERRITIN: Ferritin: 1991 ng/mL — ABNORMAL HIGH (ref 24–336)

## 2020-04-08 ENCOUNTER — Telehealth: Payer: Self-pay | Admitting: *Deleted

## 2020-04-08 NOTE — Telephone Encounter (Signed)
Returned patient's phone call regarding constipation. He stated,"I've been battling constipation for several weeks. My stool has been hard. What can I take to help with this?" Per [name], try Colace 100 mg po bid as a stool softener. Take Miralax daily. I instructed patient to call the office later in the week to see if he has had any results. He verbalized understanding.

## 2020-04-09 DIAGNOSIS — I1311 Hypertensive heart and chronic kidney disease without heart failure, with stage 5 chronic kidney disease, or end stage renal disease: Secondary | ICD-10-CM | POA: Diagnosis not present

## 2020-04-09 DIAGNOSIS — R197 Diarrhea, unspecified: Secondary | ICD-10-CM | POA: Diagnosis not present

## 2020-04-09 DIAGNOSIS — M6281 Muscle weakness (generalized): Secondary | ICD-10-CM | POA: Diagnosis not present

## 2020-04-09 DIAGNOSIS — R627 Adult failure to thrive: Secondary | ICD-10-CM | POA: Diagnosis not present

## 2020-04-09 DIAGNOSIS — C772 Secondary and unspecified malignant neoplasm of intra-abdominal lymph nodes: Secondary | ICD-10-CM | POA: Diagnosis not present

## 2020-04-09 DIAGNOSIS — Z96642 Presence of left artificial hip joint: Secondary | ICD-10-CM | POA: Diagnosis not present

## 2020-04-09 DIAGNOSIS — N186 End stage renal disease: Secondary | ICD-10-CM | POA: Diagnosis not present

## 2020-04-09 DIAGNOSIS — D630 Anemia in neoplastic disease: Secondary | ICD-10-CM | POA: Diagnosis not present

## 2020-04-09 DIAGNOSIS — Z96653 Presence of artificial knee joint, bilateral: Secondary | ICD-10-CM | POA: Diagnosis not present

## 2020-04-09 DIAGNOSIS — E785 Hyperlipidemia, unspecified: Secondary | ICD-10-CM | POA: Diagnosis not present

## 2020-04-09 DIAGNOSIS — Z87891 Personal history of nicotine dependence: Secondary | ICD-10-CM | POA: Diagnosis not present

## 2020-04-09 DIAGNOSIS — Z8546 Personal history of malignant neoplasm of prostate: Secondary | ICD-10-CM | POA: Diagnosis not present

## 2020-04-09 DIAGNOSIS — C679 Malignant neoplasm of bladder, unspecified: Secondary | ICD-10-CM | POA: Diagnosis not present

## 2020-04-09 DIAGNOSIS — D631 Anemia in chronic kidney disease: Secondary | ICD-10-CM | POA: Diagnosis not present

## 2020-04-09 DIAGNOSIS — Z9181 History of falling: Secondary | ICD-10-CM | POA: Diagnosis not present

## 2020-04-09 DIAGNOSIS — Z7901 Long term (current) use of anticoagulants: Secondary | ICD-10-CM | POA: Diagnosis not present

## 2020-04-09 DIAGNOSIS — R7303 Prediabetes: Secondary | ICD-10-CM | POA: Diagnosis not present

## 2020-04-09 DIAGNOSIS — I4891 Unspecified atrial fibrillation: Secondary | ICD-10-CM | POA: Diagnosis not present

## 2020-04-09 DIAGNOSIS — D61818 Other pancytopenia: Secondary | ICD-10-CM | POA: Diagnosis not present

## 2020-04-10 DIAGNOSIS — N1832 Chronic kidney disease, stage 3b: Secondary | ICD-10-CM | POA: Diagnosis not present

## 2020-04-14 ENCOUNTER — Telehealth (HOSPITAL_COMMUNITY): Payer: Self-pay | Admitting: *Deleted

## 2020-04-14 DIAGNOSIS — C679 Malignant neoplasm of bladder, unspecified: Secondary | ICD-10-CM | POA: Diagnosis not present

## 2020-04-14 DIAGNOSIS — I480 Paroxysmal atrial fibrillation: Secondary | ICD-10-CM | POA: Diagnosis not present

## 2020-04-14 DIAGNOSIS — I120 Hypertensive chronic kidney disease with stage 5 chronic kidney disease or end stage renal disease: Secondary | ICD-10-CM | POA: Diagnosis not present

## 2020-04-14 DIAGNOSIS — N2581 Secondary hyperparathyroidism of renal origin: Secondary | ICD-10-CM | POA: Diagnosis not present

## 2020-04-14 DIAGNOSIS — E559 Vitamin D deficiency, unspecified: Secondary | ICD-10-CM | POA: Diagnosis not present

## 2020-04-14 DIAGNOSIS — N185 Chronic kidney disease, stage 5: Secondary | ICD-10-CM | POA: Diagnosis not present

## 2020-04-14 DIAGNOSIS — K922 Gastrointestinal hemorrhage, unspecified: Secondary | ICD-10-CM | POA: Diagnosis not present

## 2020-04-14 DIAGNOSIS — E876 Hypokalemia: Secondary | ICD-10-CM | POA: Diagnosis not present

## 2020-04-14 NOTE — Telephone Encounter (Signed)
Patient wife called in stating patient is having heart rates dropping into the 40s during dialysis up to 60. Feeling very fatigued and short of breath. Discussed with Adline Peals PA will stop diltiazem. Call with update later this week.

## 2020-04-16 DIAGNOSIS — I4891 Unspecified atrial fibrillation: Secondary | ICD-10-CM | POA: Diagnosis not present

## 2020-04-16 DIAGNOSIS — Z96653 Presence of artificial knee joint, bilateral: Secondary | ICD-10-CM | POA: Diagnosis not present

## 2020-04-16 DIAGNOSIS — R7303 Prediabetes: Secondary | ICD-10-CM | POA: Diagnosis not present

## 2020-04-16 DIAGNOSIS — D631 Anemia in chronic kidney disease: Secondary | ICD-10-CM | POA: Diagnosis not present

## 2020-04-16 DIAGNOSIS — N186 End stage renal disease: Secondary | ICD-10-CM | POA: Diagnosis not present

## 2020-04-16 DIAGNOSIS — M6281 Muscle weakness (generalized): Secondary | ICD-10-CM | POA: Diagnosis not present

## 2020-04-16 DIAGNOSIS — D630 Anemia in neoplastic disease: Secondary | ICD-10-CM | POA: Diagnosis not present

## 2020-04-16 DIAGNOSIS — Z9181 History of falling: Secondary | ICD-10-CM | POA: Diagnosis not present

## 2020-04-16 DIAGNOSIS — D61818 Other pancytopenia: Secondary | ICD-10-CM | POA: Diagnosis not present

## 2020-04-16 DIAGNOSIS — C679 Malignant neoplasm of bladder, unspecified: Secondary | ICD-10-CM | POA: Diagnosis not present

## 2020-04-16 DIAGNOSIS — R197 Diarrhea, unspecified: Secondary | ICD-10-CM | POA: Diagnosis not present

## 2020-04-16 DIAGNOSIS — C772 Secondary and unspecified malignant neoplasm of intra-abdominal lymph nodes: Secondary | ICD-10-CM | POA: Diagnosis not present

## 2020-04-16 DIAGNOSIS — Z96642 Presence of left artificial hip joint: Secondary | ICD-10-CM | POA: Diagnosis not present

## 2020-04-16 DIAGNOSIS — Z87891 Personal history of nicotine dependence: Secondary | ICD-10-CM | POA: Diagnosis not present

## 2020-04-16 DIAGNOSIS — I1311 Hypertensive heart and chronic kidney disease without heart failure, with stage 5 chronic kidney disease, or end stage renal disease: Secondary | ICD-10-CM | POA: Diagnosis not present

## 2020-04-16 DIAGNOSIS — E785 Hyperlipidemia, unspecified: Secondary | ICD-10-CM | POA: Diagnosis not present

## 2020-04-16 DIAGNOSIS — Z8546 Personal history of malignant neoplasm of prostate: Secondary | ICD-10-CM | POA: Diagnosis not present

## 2020-04-16 DIAGNOSIS — Z7901 Long term (current) use of anticoagulants: Secondary | ICD-10-CM | POA: Diagnosis not present

## 2020-04-16 DIAGNOSIS — R627 Adult failure to thrive: Secondary | ICD-10-CM | POA: Diagnosis not present

## 2020-04-21 DIAGNOSIS — D631 Anemia in chronic kidney disease: Secondary | ICD-10-CM | POA: Diagnosis not present

## 2020-04-21 DIAGNOSIS — Z8546 Personal history of malignant neoplasm of prostate: Secondary | ICD-10-CM | POA: Diagnosis not present

## 2020-04-21 DIAGNOSIS — I10 Essential (primary) hypertension: Secondary | ICD-10-CM | POA: Diagnosis not present

## 2020-04-21 DIAGNOSIS — C679 Malignant neoplasm of bladder, unspecified: Secondary | ICD-10-CM | POA: Diagnosis not present

## 2020-04-21 DIAGNOSIS — R7303 Prediabetes: Secondary | ICD-10-CM | POA: Diagnosis not present

## 2020-04-21 DIAGNOSIS — I480 Paroxysmal atrial fibrillation: Secondary | ICD-10-CM | POA: Diagnosis not present

## 2020-04-21 DIAGNOSIS — Z0000 Encounter for general adult medical examination without abnormal findings: Secondary | ICD-10-CM | POA: Diagnosis not present

## 2020-04-21 DIAGNOSIS — E7800 Pure hypercholesterolemia, unspecified: Secondary | ICD-10-CM | POA: Diagnosis not present

## 2020-04-21 DIAGNOSIS — R5381 Other malaise: Secondary | ICD-10-CM | POA: Diagnosis not present

## 2020-04-21 DIAGNOSIS — N184 Chronic kidney disease, stage 4 (severe): Secondary | ICD-10-CM | POA: Diagnosis not present

## 2020-04-23 ENCOUNTER — Encounter (HOSPITAL_COMMUNITY): Payer: Self-pay | Admitting: Nurse Practitioner

## 2020-04-23 ENCOUNTER — Other Ambulatory Visit: Payer: Self-pay

## 2020-04-23 ENCOUNTER — Ambulatory Visit (HOSPITAL_COMMUNITY)
Admission: RE | Admit: 2020-04-23 | Discharge: 2020-04-23 | Disposition: A | Discharge status: Home / Self Care | Payer: PPO | Source: Ambulatory Visit | Attending: Nurse Practitioner | Admitting: Nurse Practitioner

## 2020-04-23 VITALS — BP 142/86 | HR 73 | Ht 74.0 in | Wt 254.2 lb

## 2020-04-23 DIAGNOSIS — C679 Malignant neoplasm of bladder, unspecified: Secondary | ICD-10-CM | POA: Insufficient documentation

## 2020-04-23 DIAGNOSIS — R197 Diarrhea, unspecified: Secondary | ICD-10-CM | POA: Diagnosis not present

## 2020-04-23 DIAGNOSIS — E785 Hyperlipidemia, unspecified: Secondary | ICD-10-CM | POA: Diagnosis not present

## 2020-04-23 DIAGNOSIS — Z79899 Other long term (current) drug therapy: Secondary | ICD-10-CM | POA: Diagnosis not present

## 2020-04-23 DIAGNOSIS — D6869 Other thrombophilia: Secondary | ICD-10-CM

## 2020-04-23 DIAGNOSIS — I4891 Unspecified atrial fibrillation: Secondary | ICD-10-CM | POA: Diagnosis not present

## 2020-04-23 DIAGNOSIS — C772 Secondary and unspecified malignant neoplasm of intra-abdominal lymph nodes: Secondary | ICD-10-CM | POA: Diagnosis not present

## 2020-04-23 DIAGNOSIS — D696 Thrombocytopenia, unspecified: Secondary | ICD-10-CM | POA: Insufficient documentation

## 2020-04-23 DIAGNOSIS — M6281 Muscle weakness (generalized): Secondary | ICD-10-CM | POA: Diagnosis not present

## 2020-04-23 DIAGNOSIS — D61818 Other pancytopenia: Secondary | ICD-10-CM | POA: Diagnosis not present

## 2020-04-23 DIAGNOSIS — Z96643 Presence of artificial hip joint, bilateral: Secondary | ICD-10-CM | POA: Insufficient documentation

## 2020-04-23 DIAGNOSIS — Z7901 Long term (current) use of anticoagulants: Secondary | ICD-10-CM | POA: Insufficient documentation

## 2020-04-23 DIAGNOSIS — Z87891 Personal history of nicotine dependence: Secondary | ICD-10-CM | POA: Insufficient documentation

## 2020-04-23 DIAGNOSIS — Z96642 Presence of left artificial hip joint: Secondary | ICD-10-CM | POA: Diagnosis not present

## 2020-04-23 DIAGNOSIS — Z9181 History of falling: Secondary | ICD-10-CM | POA: Diagnosis not present

## 2020-04-23 DIAGNOSIS — I4819 Other persistent atrial fibrillation: Secondary | ICD-10-CM | POA: Diagnosis not present

## 2020-04-23 DIAGNOSIS — I1311 Hypertensive heart and chronic kidney disease without heart failure, with stage 5 chronic kidney disease, or end stage renal disease: Secondary | ICD-10-CM | POA: Diagnosis not present

## 2020-04-23 DIAGNOSIS — R7303 Prediabetes: Secondary | ICD-10-CM | POA: Diagnosis not present

## 2020-04-23 DIAGNOSIS — D631 Anemia in chronic kidney disease: Secondary | ICD-10-CM | POA: Diagnosis not present

## 2020-04-23 DIAGNOSIS — Z8546 Personal history of malignant neoplasm of prostate: Secondary | ICD-10-CM | POA: Diagnosis not present

## 2020-04-23 DIAGNOSIS — D630 Anemia in neoplastic disease: Secondary | ICD-10-CM | POA: Diagnosis not present

## 2020-04-23 DIAGNOSIS — R627 Adult failure to thrive: Secondary | ICD-10-CM | POA: Diagnosis not present

## 2020-04-23 DIAGNOSIS — N186 End stage renal disease: Secondary | ICD-10-CM | POA: Diagnosis not present

## 2020-04-23 DIAGNOSIS — Z96653 Presence of artificial knee joint, bilateral: Secondary | ICD-10-CM | POA: Diagnosis not present

## 2020-04-23 NOTE — Patient Instructions (Signed)
Decrease amiodarone to one tablet a day in am, staring 04/30/20.

## 2020-04-23 NOTE — Progress Notes (Signed)
Primary Care Physician: Johny Blamer, MD Referring Physician: Dr. Rennis Golden Cardiologist: Dr. Rennis Golden  Oncologist : Dr. Myna Hidalgo Urologist: Dr. Yetta Numbers is a 70 y.o. 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 Rennis Golden as pt had recent cardioversion with ERAF. In his note he suggested use of flecainide and recent stress test was low risk on review of Dr. Rennis Golden.   The pt does c/o of a lot of fatigue but has other issues as well contributing. He is rate controlled. He is on eliquis 5 mg bid for a CHA2DS2VASc Score of 2. Heis pending a bladder resection on 4/26. His last creatinine was 2.11.  Pt had pending resection of bladder pending 4/26. His BB was increased prior to surgery to have better rate control. It was not an appropriate time to try to restore SR as I am sure chemical or electrical cardioversion would have been needed and that meant he would have to delay surgery as he would not be able to come off anticoagulation x 4 weeks. He would not have been able to use flecainide as it interfered with 4 of his medicines.   He returns today, 08/15/19 as physical therapy found his HR increased too much with activity and he  paused therapy. He is rate controlled today at 92 mbp. His BP is soft at 106/64, unable to increase rate control for this finding. He started back on anticoagulation right after his bladder resection 4/26 but had to stop it the following  Monday as he had increase hematuria. Hematuria  is improved off anticoagulation. He is c/o of being short of breath with exertion and weak, more so since his procedure. Marland Kitchen He feels terrible. He rolled out of bed last pm hitting his left eye on the bed stand and scraping his  rt knee. He was aware of his surroundings when he hit the floor. No  LOC. His hgb was found to be 7.5 and urology arranged for an outpt transfusion.   He is now  returned to afib clinic, 6/1,  to discuss tikosyn admit. Marland Kitchen He is feeling well better since infusion. He has seen minimal hematuria. He continues with fatigue and exertional dyspnea. He chemo was put on hold 2 weeks ago as he felt poorly. He feels it will resume next week. He remains in rate control afib. He has received both covid shots.   F/u in afib clinic for tikosyn admit, 6/15. He was to have his chemo last week but the oncologist said that he would delay until after Tikosyn load. No benadryl or no missed  DOAC for 3 weeks. No significant hematuria.   F/u in afib clinic, 6/25. He is here one week s/p Tikosyn. He had steroids and chemo on  Tues/Wed  but when he went on Thursday, he was noted to have bigeminy PVC's and chemo was not given. He was in rhythm. He does feel better in SR. He can walk further without any shortness of breath. The PVC's have been present before Tikosyn start and he was often in bigeminy PVC's while in the hospital, qtc is stable. I discussed with Dr.Allred, he suggested an updated echo but otherwise is not overly concerned as they were there  prior to tikosyn.   F/u in afib clinic, 11/13/19. He is being seen as he felt poorly yesterday and noted HR of 120 after walking. He was  questioning if he was back in Afib. His EKG shows SR today.  He has not had chemo x 2 weeks. Last  HGB also showed  a drop to 9.8 from  11.8 one month earlier at the [facility].  He also had a low K+, being treated with extra K+ with plans to recheck next week, being followed by Francis Dowse, PA. He continues on dofetilide and apixaban for a CHA2DS2VASc of 2.   F/u in afib clinic, 01/28/20. He remains in SR with PVC's, which are persistent for pt and preceded Tikosyn load. Overall, he feels well. He could have his recent chemo as his pts were low. He is to report again today to see if he can have chemo. No bleeding issues on eliquis.  F/u in afib clinic, 04/24/19. He  had a hospitalization early November  with dx of fall, anemia, renal failure and sepsis. Tikosyn was put on hold 2/2 of renal decline.. Afib was rate controlled. He saw Dr. Johney Frame in f/u 03/24/20, and it was decided to start amiodarone load with plans for cardioversion one month later if reminded in afib. He called clinic last week with bradycardia with HR's in the 40's and diltiazem was stopped. . EKG now shows Sinus rhythm at 73 bpm. He is disappointed that he is in rhythm and still is fatigued and short of breath with activities. I feel these symptoms are multifactorial due  to his significant other health issues.    Today, he denies symptoms of palpitations, chest pain, shortness of breath, orthopnea, PND, lower extremity edema, dizziness, presyncope, syncope, or neurologic sequela.++ fatigue. The patient is tolerating medications without difficulties and is otherwise without complaint today.   Past Medical History:  Diagnosis Date  . Anemia   . Arthritis   . 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 (HCC) 09/29/2016  . Bladder tumor   . Chronic renal insufficiency    stage IV  . Dyspnea    with exertion  . Essential hypertension 06/23/2018  . Goals of care, counseling/discussion 09/30/2016  . History of prostate cancer followed by pcp dr Tiburcio Pea-  per pt last PSA undetectable   dx 2008-- (Stage T1c, Gleason 3+3,  PSA 4.58, vol 99cc)  s/p  radical prostatectomy (nerve sparing bilateral)   . Hyperglycemia 06/23/2018  . Hypertension   . Mild hyperlipidemia 06/23/2018  . Persistent atrial fibrillation (HCC)    atrial fib  . Pre-diabetes   . Wears glasses    Past Surgical History:  Procedure Laterality Date  . CARDIOVERSION N/A 05/18/2019   Procedure: CARDIOVERSION;  Surgeon: Lewayne Bunting, MD;  Location: [facility] ENDOSCOPY;  Service: Cardiovascular;  Laterality: N/A;  . CARDIOVERSION N/A 09/27/2019   Procedure: CARDIOVERSION;  Surgeon: Little Ishikawa, MD;  Location: [facility] ENDOSCOPY;  Service: Cardiovascular;   Laterality: N/A;  . CATARACT EXTRACTION W/ INTRAOCULAR LENS  IMPLANT, BILATERAL Bilateral 2011  . CERVICAL SPINE SURGERY  1997  . ESOPHAGOGASTRODUODENOSCOPY N/A 02/19/2020   Procedure: ESOPHAGOGASTRODUODENOSCOPY (EGD);  Surgeon: Charlott Rakes, MD;  Location: [facility] ENDOSCOPY;  Service: Gastroenterology;  Laterality: N/A;  . GIVENS CAPSULE STUDY N/A 02/19/2020   Procedure: GIVENS CAPSULE STUDY;  Surgeon: Charlott Rakes, MD;  Location: [facility] ENDOSCOPY;  Service: Gastroenterology;  Laterality: N/A;  . IR FLUORO GUIDE PORT INSERTION RIGHT  10/07/2016  . IR RADIOLOGIST EVAL & MGMT  01/05/2018  . IR US GUIDE VASC ACCESS RIGHT  10/07/2016  . KNEE ARTHROSCOPY Bilateral right 2006;  left 02-15-2007  . LUMBAR SPINE SURGERY  1999;  1990;  1983  . ROBOT ASSISTED LAPAROSCOPIC RADICAL PROSTATECTOMY  06/20/2006   bilateral nerve sparing  . TEE WITHOUT CARDIOVERSION N/A 06/27/2018   Procedure: TRANSESOPHAGEAL ECHOCARDIOGRAM (TEE);  Surgeon: Nigel Mormon, MD;  Location: [facility] ENDOSCOPY;  Service: Cardiovascular;  Laterality: N/A;  . TOTAL HIP ARTHROPLASTY Left 03/03/2015   Procedure: LEFT TOTAL HIP ARTHROPLASTY ANTERIOR APPROACH;  Surgeon: Dorna Leitz, MD;  Location: [place];  Service: Orthopedics;  Laterality: Left;  . TOTAL KNEE ARTHROPLASTY Bilateral left 08-13-2009;  right 12-26-2009  . TRANSURETHRAL RESECTION OF BLADDER TUMOR N/A 09/20/2016   Procedure: TRANSURETHRAL RESECTION OF BLADDER TUMOR (TURBT);  Surgeon: Franchot Gallo, MD;  Location: [facility];  Service: Urology;  Laterality: N/A;  . TRANSURETHRAL RESECTION OF BLADDER TUMOR WITH MITOMYCIN-C N/A 08/06/2019   Procedure: TRANSURETHRAL RESECTION OF BLADDER TUMOR;  Surgeon: Franchot Gallo, MD;  Location: [facility];  Service: Urology;  Laterality: N/A;    Current Outpatient Medications  Medication Sig Dispense Refill  . acetaminophen (TYLENOL) 500 MG tablet Take 1,000 mg by mouth every 6 (six) hours as needed  for mild pain or headache.    Marland Kitchen amiodarone (PACERONE) 200 MG tablet Take 1 tablet (200 mg total) by mouth 2 (two) times daily. 180 tablet 3  . apixaban (ELIQUIS) 5 MG TABS tablet Take 1 tablet (5 mg total) by mouth 2 (two) times daily. 60 tablet 6  . atorvastatin (LIPITOR) 40 MG tablet Take 40 mg by mouth every evening.     . Cholecalciferol (D3-50) 1.25 MG (50000 UT) capsule Take 50,000 Units by mouth once a week.    . cyclobenzaprine (FEXMID) 7.5 MG tablet Take 1 tablet (7.5 mg total) by mouth 3 (three) times daily as needed for muscle spasms. 60 tablet 2  . dexamethasone (DECADRON) 4 MG tablet Take 2 tablets (8 mg) daily for 3 days after chemotherapy. Take with food. 30 tablet 1  . DULoxetine (CYMBALTA) 60 MG capsule TAKE ONE CAPSULE BY MOUTH DAILY 30 capsule 3  . famotidine (PEPCID) 20 MG tablet Take 20 mg by mouth 2 (two) times daily.    Marland Kitchen lidocaine-prilocaine (EMLA) cream Apply to affected area once 30 g 3  . loperamide (IMODIUM A-D) 2 MG tablet Take 2 at diarrhea onset, then 1 every 2hr until 12hrs with no BM. May take 2 every 4hrs at night. If diarrhea recurs repeat. 100 tablet 1  . LORazepam (ATIVAN) 0.5 MG tablet Take 0.5 mg by mouth every 6 (six) hours as needed.    . magnesium oxide (MAG-OX) 400 MG tablet Take 1 tablet (400 mg total) by mouth 2 (two) times daily.    . metoprolol tartrate (LOPRESSOR) 25 MG tablet Take 25 mg by mouth 2 (two) times daily.    Marland Kitchen oxybutynin (DITROPAN-XL) 10 MG 24 hr tablet Take 10 mg by mouth at bedtime.    . potassium chloride (KLOR-CON) 10 MEQ tablet Take 2 tablets (20 mEq total) by mouth daily. 180 tablet 3  . prochlorperazine (COMPAZINE) 10 MG tablet Take 1 tablet (10 mg total) by mouth every 6 (six) hours as needed (Nausea or vomiting). 30 tablet 1  . pyridoxine (B-6) 100 MG tablet Take 200 mg by mouth daily with breakfast.     . sodium bicarbonate 650 MG tablet Take 2 tablets (1,300 mg total) by mouth 2 (two) times daily. 120 tablet 1  . Vitamin D,  Ergocalciferol, (DRISDOL) 1.25 MG (50000 UNIT) CAPS capsule Take 50,000 Units by mouth once a week.  No current facility-administered medications for this encounter.    No Known Allergies  Social History   Socioeconomic History  . Marital status: Married    Spouse name: Not on file  . Number of children: Not on file  . Years of education: Not on file  . Highest education level: Not on file  Occupational History  . Not on file  Tobacco Use  . Smoking status: Former Smoker    Packs/day: 1.00    Years: 16.00    Pack years: 16.00    Types: Cigarettes    Quit date: 09/25/1984    Years since quitting: 35.6  . Smokeless tobacco: Never Used  Vaping Use  . Vaping Use: Never used  Substance and Sexual Activity  . Alcohol use: Yes    Comment: occasionally  . Drug use: Yes    Types: Marijuana    Comment: after chemo to help sleep  . Sexual activity: Not on file  Other Topics Concern  . Not on file  Social History Narrative  . Not on file   Social Determinants of Health   Financial Resource Strain: Not on file  Food Insecurity: No Food Insecurity  . Worried About Charity fundraiser in the Last Year: Never true  . Ran Out of Food in the Last Year: Never true  Transportation Needs: No Transportation Needs  . Lack of Transportation (Medical): No  . Lack of Transportation (Non-Medical): No  Physical Activity: Not on file  Stress: Not on file  Social Connections: Not on file  Intimate Partner Violence: Not on file    Family History  Problem Relation Age of Onset  . Hypertension Mother   . Aneurysm Mother   . Emphysema Father   . Hypertension Father     ROS- All systems are reviewed and negative except as per the HPI above  Physical Exam: Vitals:   04/23/20 0953  Weight: 115.3 kg  Height: $Remove'6\' 2"'[id number]$  (1.88 m)   Wt Readings from Last 3 Encounters:  04/23/20 115.3 kg  03/31/20 113.9 kg  03/24/20 115.7 kg    Labs: Lab Results  Component Value Date   NA 133 (L)  03/31/2020   K 3.3 (L) 03/31/2020   CL 101 03/31/2020   CO2 21 (L) 03/31/2020   GLUCOSE 181 (H) 03/31/2020   BUN 45 (H) 03/31/2020   CREATININE 4.64 (HH) 03/31/2020   CALCIUM 9.2 03/31/2020   PHOS 3.2 03/31/2020   MG 1.7 03/31/2020   Lab Results  Component Value Date   INR 1.5 (H) 02/18/2020   Lab Results  Component Value Date   CHOL 159 10/05/2018   HDL 47 10/05/2018   LDLCALC 86 10/05/2018   TRIG 131 10/05/2018     GEN- The patient is well appearing, alert and oriented x 3 today.   Head- normocephalic, atraumatic Eyes-  Sclera clear, conjunctiva pink Ears- hearing intact Oropharynx- clear Neck- supple, no JVP Lymph- no cervical lymphadenopathy Lungs- Clear to ausculation bilaterally, normal work of breathing Heart- regular rate and rhythm(pvc's), no murmurs, rubs or gallops, PMI not laterally displaced GI- soft, NT, ND, + BS Extremities- no clubbing, cyanosis, or edema MS- no significant deformity or atrophy Skin- no rash or lesion Psych- euthymic mood, full affect Neuro- strength and sensation are intact  EKG- Sinus rhythm at 73 bpm,with first degree AV block  pr int 266 ms, qrs int 104 ms, qtc 453 ms  Epic records reviewed  Echo-1. The left ventricle has normal systolic function, with  an ejection  fraction of 55-60%. The cavity size was normal. Left ventricular diastolic  Doppler parameters are consistent with impaired relaxation.  2. The right ventricle has normal systolic function. The cavity was  mildly enlarged. There is no increase in right ventricular wall thickness.  3. Right atrial size was mildly dilated.  4. Moderate thickening of the aortic valve Mild calcification of the  aortic valve.  5. There is dilatation of the aortic root measuring 39 mm.  6. No vegetations detected.   Assessment and Plan: 1.  Persistent  Afib Off tikosyn for CKD He is in rhythm after amiodarone loading since 12/13. Reduce  to one tablet of amiodarone  daily after  one more week on loading dose  Continue  Metoprolol 25  mg bid    Off diltiazem since last week when his HR's were noted in the 40's, likely converted to SR at that ime   2. CHA2DS2VASc score of 2 Continue eliquis 5 mg bid   3. Bladder CA Per urology and oncology   4. Chronic anemia/Low plts  Per oncology  F/u in afib clinic in 1 month   Butch Penny C. Carroll, [facility] [address] [place], [place] [address] [phone]

## 2020-04-24 ENCOUNTER — Telehealth: Payer: Self-pay

## 2020-04-24 NOTE — Telephone Encounter (Signed)
S/w pt and he is aware of possible delay and closure of the office 04/28/20  Due to weather   aom

## 2020-04-25 ENCOUNTER — Telehealth: Payer: Self-pay

## 2020-04-25 NOTE — Telephone Encounter (Signed)
Due to weather on 04/28/20 will not open until 11:00, appts adjusted and pt is aware, also aware to look for cx on my chart and news   aom

## 2020-04-28 ENCOUNTER — Other Ambulatory Visit: Payer: Self-pay

## 2020-04-28 ENCOUNTER — Inpatient Hospital Stay: Payer: PPO

## 2020-04-28 ENCOUNTER — Inpatient Hospital Stay (HOSPITAL_BASED_OUTPATIENT_CLINIC_OR_DEPARTMENT_OTHER): Payer: PPO | Admitting: Hematology & Oncology

## 2020-04-28 ENCOUNTER — Encounter: Payer: Self-pay | Admitting: Hematology & Oncology

## 2020-04-28 ENCOUNTER — Inpatient Hospital Stay: Payer: PPO | Admitting: Hematology & Oncology

## 2020-04-28 ENCOUNTER — Inpatient Hospital Stay: Payer: PPO | Attending: Hematology & Oncology

## 2020-04-28 VITALS — BP 158/79 | HR 65 | Temp 98.2°F | Resp 20 | Ht 74.0 in | Wt 254.0 lb

## 2020-04-28 DIAGNOSIS — D631 Anemia in chronic kidney disease: Secondary | ICD-10-CM | POA: Insufficient documentation

## 2020-04-28 DIAGNOSIS — N289 Disorder of kidney and ureter, unspecified: Secondary | ICD-10-CM | POA: Diagnosis not present

## 2020-04-28 DIAGNOSIS — D508 Other iron deficiency anemias: Secondary | ICD-10-CM

## 2020-04-28 DIAGNOSIS — C772 Secondary and unspecified malignant neoplasm of intra-abdominal lymph nodes: Secondary | ICD-10-CM

## 2020-04-28 DIAGNOSIS — Z95828 Presence of other vascular implants and grafts: Secondary | ICD-10-CM

## 2020-04-28 DIAGNOSIS — I4891 Unspecified atrial fibrillation: Secondary | ICD-10-CM | POA: Diagnosis not present

## 2020-04-28 DIAGNOSIS — Z79899 Other long term (current) drug therapy: Secondary | ICD-10-CM | POA: Diagnosis not present

## 2020-04-28 DIAGNOSIS — N179 Acute kidney failure, unspecified: Secondary | ICD-10-CM

## 2020-04-28 DIAGNOSIS — C679 Malignant neoplasm of bladder, unspecified: Secondary | ICD-10-CM

## 2020-04-28 LAB — CBC WITH DIFFERENTIAL (CANCER CENTER ONLY)
Abs Immature Granulocytes: 0.15 10*3/uL — ABNORMAL HIGH (ref 0.00–0.07)
Basophils Absolute: 0 10*3/uL (ref 0.0–0.1)
Basophils Relative: 0 %
Eosinophils Absolute: 0.4 10*3/uL (ref 0.0–0.5)
Eosinophils Relative: 3 %
HCT: 31.5 % — ABNORMAL LOW (ref 39.0–52.0)
Hemoglobin: 10.3 g/dL — ABNORMAL LOW (ref 13.0–17.0)
Immature Granulocytes: 1 %
Lymphocytes Relative: 10 %
Lymphs Abs: 1.1 10*3/uL (ref 0.7–4.0)
MCH: 32.7 pg (ref 26.0–34.0)
MCHC: 32.7 g/dL (ref 30.0–36.0)
MCV: 100 fL (ref 80.0–100.0)
Monocytes Absolute: 1.5 10*3/uL — ABNORMAL HIGH (ref 0.1–1.0)
Monocytes Relative: 14 %
Neutro Abs: 7.7 10*3/uL (ref 1.7–7.7)
Neutrophils Relative %: 72 %
Platelet Count: 142 10*3/uL — ABNORMAL LOW (ref 150–400)
RBC: 3.15 MIL/uL — ABNORMAL LOW (ref 4.22–5.81)
RDW: 15.7 % — ABNORMAL HIGH (ref 11.5–15.5)
WBC Count: 10.9 10*3/uL — ABNORMAL HIGH (ref 4.0–10.5)
nRBC: 0 % (ref 0.0–0.2)

## 2020-04-28 LAB — IRON AND TIBC
Iron: 55 ug/dL (ref 45–182)
Saturation Ratios: 23 % (ref 17.9–39.5)
TIBC: 241 ug/dL — ABNORMAL LOW (ref 250–450)
UIBC: 186 ug/dL

## 2020-04-28 LAB — CMP (CANCER CENTER ONLY)
ALT: 25 U/L (ref 0–44)
AST: 29 U/L (ref 15–41)
Albumin: 3.9 g/dL (ref 3.5–5.0)
Alkaline Phosphatase: 95 U/L (ref 38–126)
Anion gap: 10 (ref 5–15)
BUN: 48 mg/dL — ABNORMAL HIGH (ref 8–23)
CO2: 22 mmol/L (ref 22–32)
Calcium: 9.2 mg/dL (ref 8.9–10.3)
Chloride: 99 mmol/L (ref 98–111)
Creatinine: 5.16 mg/dL (ref 0.61–1.24)
GFR, Estimated: 11 mL/min — ABNORMAL LOW (ref >60)
Glucose, Bld: 111 mg/dL — ABNORMAL HIGH (ref 70–99)
Potassium: 3.9 mmol/L (ref 3.5–5.1)
Sodium: 131 mmol/L — ABNORMAL LOW (ref 135–145)
Total Bilirubin: 0.5 mg/dL (ref 0.3–1.2)
Total Protein: 7.4 g/dL (ref 6.5–8.1)

## 2020-04-28 LAB — PHOSPHORUS: Phosphorus: 3.6 mg/dL (ref 2.5–4.6)

## 2020-04-28 LAB — MAGNESIUM: Magnesium: 2 mg/dL (ref 1.7–2.4)

## 2020-04-28 LAB — RETICULOCYTES
Immature Retic Fract: 10.2 % (ref 2.3–15.9)
RBC.: 3.16 MIL/uL — ABNORMAL LOW (ref 4.22–5.81)
Retic Count, Absolute: 53.4 10*3/uL (ref 19.0–186.0)
Retic Ct Pct: 1.7 % (ref 0.4–3.1)

## 2020-04-28 LAB — SAMPLE TO BLOOD BANK

## 2020-04-28 LAB — FERRITIN: Ferritin: 1015 ng/mL — ABNORMAL HIGH (ref 24–336)

## 2020-04-28 LAB — LACTATE DEHYDROGENASE: LDH: 192 U/L (ref 98–192)

## 2020-04-28 MED ORDER — HEPARIN SOD (PORK) LOCK FLUSH 100 UNIT/ML IV SOLN
500.0000 [IU] | Freq: Once | INTRAVENOUS | Status: AC
  Administered 2020-04-28: 500 [IU] via INTRAVENOUS
  Filled 2020-04-28: qty 5

## 2020-04-28 MED ORDER — DARBEPOETIN ALFA 300 MCG/0.6ML IJ SOSY: 300.0000 ug | PREFILLED_SYRINGE | Freq: Once | INTRAMUSCULAR | Status: DC

## 2020-04-28 MED ORDER — SODIUM CHLORIDE 0.9% FLUSH
10.0000 mL | Freq: Once | INTRAVENOUS | Status: AC
  Administered 2020-04-28: 10 mL via INTRAVENOUS
  Filled 2020-04-28: qty 10

## 2020-04-28 MED ORDER — DARBEPOETIN ALFA 300 MCG/0.6ML IJ SOSY
PREFILLED_SYRINGE | INTRAMUSCULAR | Status: AC
  Filled 2020-04-28: qty 0.6

## 2020-04-28 NOTE — Progress Notes (Signed)
No injection needed today per md

## 2020-04-28 NOTE — Progress Notes (Signed)
Hematology and Oncology Follow Up Visit  [name] Rose [id number] Jun 17, 1950 70 y.o. 04/28/2020   Principle Diagnosis:  Metastatic high-grade bladder cancer - recurrent Anemia of renal insufficiency Diverticular abscess - E.coli  Past Therapy: Atezolizumab 1200mg  IV q 3 wks - s/p cycle #4 - d/c due to progression. Taxotere 80mg /m2 IV q 3 wks - s/p cycle #3 - d/c due to progression M-VAC - s/p cycle#8 -- d/c on 07/10/2018 due to blood counts Padcev -- s/p cycle #1 on 04/06/2020d/c 08/28/2018  Current Therapy: Trodelvy8 mg/kg IV d1&8 -- s/p cycle #3  -- start on 12/31/2019 Aranesp 300 mcg sq q 3 week for Hgb < 11 Ifosfamide/Gemzar -- s/p cycle #9- started 04/16/2019  -- d/c on 12/21/2019 due to progression IV iron as indicated   Interim History:  [name] Rose is here today for follow-up.  This is probably best I have seen him look in quite a while.  I think a lot of this is based on the fact that he is in sinus rhythm right now.  I think that his quality of life is really affected by his atrial fibrillation.  I am just happy that he is feeling well.  He had a nice Christmas and New Year's.  He and his wife went down to [facility] for a few days and enjoyed themselves.  He has had no problems with bleeding.  He is on blood thinner for the atrial fibrillation.  There has been no issues with nausea or vomiting.  He has had no cough or shortness of breath.  He is a little constipated but this is chronic.  There is no leg swelling.  His skin has been a little bit on the dry side.  He has had no problems with pain.  Overall, I think his performance status is ECOG 1.      Medications:  Allergies as of 04/28/2020   No Known Allergies     Medication List       Accurate as of April 28, 2020 12:07 PM. If you have any questions, ask your nurse or doctor.        acetaminophen 500 MG tablet Commonly known as: TYLENOL Take 1,000 mg by mouth every 6 (six) hours  as needed for mild pain or headache.   amiodarone 200 MG tablet Commonly known as: PACERONE Take 1 tablet (200 mg total) by mouth 2 (two) times daily.   apixaban 5 MG Tabs tablet Commonly known as: Eliquis Take 1 tablet (5 mg total) by mouth 2 (two) times daily.   atorvastatin 40 MG tablet Commonly known as: LIPITOR Take 40 mg by mouth every evening.   cyclobenzaprine 7.5 MG tablet Commonly known as: FEXMID Take 1 tablet (7.5 mg total) by mouth 3 (three) times daily as needed for muscle spasms.   D3-50 1.25 MG (50000 UT) capsule Generic drug: Cholecalciferol Take 50,000 Units by mouth once a week.   dexamethasone 4 MG tablet Commonly known as: DECADRON Take 2 tablets (8 mg) daily for 3 days after chemotherapy. Take with food.   DULoxetine 60 MG capsule Commonly known as: CYMBALTA TAKE ONE CAPSULE BY MOUTH DAILY   famotidine 20 MG tablet Commonly known as: PEPCID Take 20 mg by mouth 2 (two) times daily.   lidocaine-prilocaine cream Commonly known as: EMLA Apply to affected area once   loperamide 2 MG tablet Commonly known as: IMODIUM A-D Take 2 at diarrhea onset, then 1 every 2hr until 12hrs with no BM. May take 2  every 4hrs at night. If diarrhea recurs repeat.   LORazepam 0.5 MG tablet Commonly known as: ATIVAN Take 0.5 mg by mouth every 6 (six) hours as needed.   magnesium oxide 400 MG tablet Commonly known as: MAG-OX Take 1 tablet (400 mg total) by mouth 2 (two) times daily.   metoprolol tartrate 25 MG tablet Commonly known as: LOPRESSOR Take 25 mg by mouth 2 (two) times daily.   oxybutynin 10 MG 24 hr tablet Commonly known as: DITROPAN-XL Take 10 mg by mouth at bedtime.   potassium chloride 10 MEQ tablet Commonly known as: KLOR-CON Take 2 tablets (20 mEq total) by mouth daily.   prochlorperazine 10 MG tablet Commonly known as: COMPAZINE Take 1 tablet (10 mg total) by mouth every 6 (six) hours as needed (Nausea or vomiting).   pyridoxine 100 MG  tablet Commonly known as: B-6 Take 200 mg by mouth daily with breakfast.   sodium bicarbonate 650 MG tablet Take 2 tablets (1,300 mg total) by mouth 2 (two) times daily.   Vitamin D (Ergocalciferol) 1.25 MG (50000 UNIT) Caps capsule Commonly known as: DRISDOL Take 50,000 Units by mouth once a week.       Allergies: No Known Allergies  Past Medical History, Surgical history, Social history, and Family History were reviewed and updated.  Review of Systems: Review of Systems  Constitutional: Negative.   HENT: Negative.   Eyes: Negative.   Respiratory: Negative.   Cardiovascular: Positive for palpitations.  Gastrointestinal: Positive for nausea.  Genitourinary: Negative.   Musculoskeletal: Positive for myalgias.  Skin: Negative.   Neurological: Positive for tingling.  Endo/Heme/Allergies: Negative.   Psychiatric/Behavioral: Negative.      Physical Exam:  vitals were not taken for this visit.   Wt Readings from Last 3 Encounters:  04/23/20 254 lb 3.2 oz (115.3 kg)  03/31/20 251 lb (113.9 kg)  03/24/20 255 lb (115.7 kg)    Physical Exam Vitals reviewed.  HENT:     Head: Normocephalic and atraumatic.  Eyes:     Pupils: Pupils are equal, round, and reactive to light.  Cardiovascular:     Rate and Rhythm: Tachycardia present. Rhythm irregular.     Heart sounds: Normal heart sounds.     Comments: Cardiac exam is tachycardic and irregular consistent with the atrial fibrillation.  There are no murmurs. Pulmonary:     Effort: Pulmonary effort is normal.     Breath sounds: Normal breath sounds.  Abdominal:     General: Bowel sounds are normal.     Palpations: Abdomen is soft.  Musculoskeletal:        General: No tenderness or deformity. Normal range of motion.     Cervical back: Normal range of motion.  Lymphadenopathy:     Cervical: No cervical adenopathy.  Skin:    General: Skin is warm and dry.     Findings: No erythema or rash.  Neurological:     Mental  Status: He is alert and oriented to person, place, and time.  Psychiatric:        Behavior: Behavior normal.        Thought Content: Thought content normal.        Judgment: Judgment normal.      Lab Results  Component Value Date   WBC 10.8 (H) 03/31/2020   HGB 9.2 (L) 03/31/2020   HCT 28.1 (L) 03/31/2020   MCV 97.2 03/31/2020   PLT 153 03/31/2020   Lab Results  Component Value Date   FERRITIN 1,991 (  H) 03/31/2020   IRON 102 03/31/2020   TIBC 205 03/31/2020   UIBC 103 (L) 03/31/2020   IRONPCTSAT 50 03/31/2020   Lab Results  Component Value Date   RETICCTPCT 1.8 03/10/2020   RBC 2.89 (L) 03/31/2020   No results found for: KPAFRELGTCHN, LAMBDASER, KAPLAMBRATIO No results found for: IGGSERUM, IGA, IGMSERUM No results found for: Kathrynn Ducking, MSPIKE, SPEI   Chemistry      Component Value Date/Time   NA 133 (L) 03/31/2020 0910   NA 140 12/18/2019 1411   NA 141 02/21/2017 0902   K 3.3 (L) 03/31/2020 0910   K 4.1 02/21/2017 0902   CL 101 03/31/2020 0910   CL 98 02/21/2017 0902   CO2 21 (L) 03/31/2020 0910   CO2 31 02/21/2017 0902   BUN 45 (H) 03/31/2020 0910   BUN 25 12/18/2019 1411   BUN 17 02/21/2017 0902   CREATININE 4.64 (HH) 03/31/2020 0910   CREATININE 1.2 02/21/2017 0902      Component Value Date/Time   CALCIUM 9.2 03/31/2020 0910   CALCIUM 9.2 02/21/2017 0902   ALKPHOS 91 03/31/2020 0910   ALKPHOS 65 02/21/2017 0902   AST 22 03/31/2020 0910   ALT 21 03/31/2020 0910   ALT 18 02/21/2017 0902   BILITOT 0.4 03/31/2020 0910       Impression and Plan: Mr. Cazier is a very pleasant 70yo caucasian gentleman with metastatic high grade bladder cancer.  He is doing well right now.  I think a lot of this has to do with the fact that he is in sinus rhythm.  We do have to do another PET scan on him.  This will be done in late February.  This will tell us if he is progressing.  If he is progressing, I am not sure  how much we can really do.  He has been quite a few treatments.  I just want to make sure that what ever we do is about his quality of life.  We will plan to get him back in about 5 weeks or so.  I would not give him Aranesp today.  I do not think Aranesp really will affect his quality of life.    Volanda Napoleon, MD 1/17/202212:07 PM

## 2020-05-07 DIAGNOSIS — C678 Malignant neoplasm of overlapping sites of bladder: Secondary | ICD-10-CM | POA: Diagnosis not present

## 2020-05-07 DIAGNOSIS — C775 Secondary and unspecified malignant neoplasm of intrapelvic lymph nodes: Secondary | ICD-10-CM | POA: Diagnosis not present

## 2020-05-20 DIAGNOSIS — N185 Chronic kidney disease, stage 5: Secondary | ICD-10-CM | POA: Diagnosis not present

## 2020-05-22 DIAGNOSIS — R531 Weakness: Secondary | ICD-10-CM | POA: Diagnosis not present

## 2020-05-22 DIAGNOSIS — R5381 Other malaise: Secondary | ICD-10-CM | POA: Diagnosis not present

## 2020-05-22 DIAGNOSIS — Z7409 Other reduced mobility: Secondary | ICD-10-CM | POA: Diagnosis not present

## 2020-05-22 DIAGNOSIS — G629 Polyneuropathy, unspecified: Secondary | ICD-10-CM | POA: Diagnosis not present

## 2020-05-22 DIAGNOSIS — R2689 Other abnormalities of gait and mobility: Secondary | ICD-10-CM | POA: Diagnosis not present

## 2020-05-27 DIAGNOSIS — K922 Gastrointestinal hemorrhage, unspecified: Secondary | ICD-10-CM | POA: Diagnosis not present

## 2020-05-27 DIAGNOSIS — G629 Polyneuropathy, unspecified: Secondary | ICD-10-CM | POA: Diagnosis not present

## 2020-05-27 DIAGNOSIS — Z7409 Other reduced mobility: Secondary | ICD-10-CM | POA: Diagnosis not present

## 2020-05-27 DIAGNOSIS — R5381 Other malaise: Secondary | ICD-10-CM | POA: Diagnosis not present

## 2020-05-27 DIAGNOSIS — E876 Hypokalemia: Secondary | ICD-10-CM | POA: Diagnosis not present

## 2020-05-27 DIAGNOSIS — N185 Chronic kidney disease, stage 5: Secondary | ICD-10-CM | POA: Diagnosis not present

## 2020-05-27 DIAGNOSIS — R531 Weakness: Secondary | ICD-10-CM | POA: Diagnosis not present

## 2020-05-27 DIAGNOSIS — I120 Hypertensive chronic kidney disease with stage 5 chronic kidney disease or end stage renal disease: Secondary | ICD-10-CM | POA: Diagnosis not present

## 2020-05-27 DIAGNOSIS — N2581 Secondary hyperparathyroidism of renal origin: Secondary | ICD-10-CM | POA: Diagnosis not present

## 2020-05-27 DIAGNOSIS — R2689 Other abnormalities of gait and mobility: Secondary | ICD-10-CM | POA: Diagnosis not present

## 2020-05-27 DIAGNOSIS — C679 Malignant neoplasm of bladder, unspecified: Secondary | ICD-10-CM | POA: Diagnosis not present

## 2020-05-27 DIAGNOSIS — I480 Paroxysmal atrial fibrillation: Secondary | ICD-10-CM | POA: Diagnosis not present

## 2020-05-28 ENCOUNTER — Other Ambulatory Visit: Payer: Self-pay

## 2020-05-28 ENCOUNTER — Ambulatory Visit (HOSPITAL_COMMUNITY)
Admission: RE | Admit: 2020-05-28 | Discharge: 2020-05-28 | Disposition: A | Discharge status: Home / Self Care | Payer: PPO | Source: Ambulatory Visit | Attending: Nurse Practitioner | Admitting: Nurse Practitioner

## 2020-05-28 VITALS — BP 144/84 | HR 63 | Ht 74.0 in | Wt 253.4 lb

## 2020-05-28 DIAGNOSIS — E785 Hyperlipidemia, unspecified: Secondary | ICD-10-CM | POA: Insufficient documentation

## 2020-05-28 DIAGNOSIS — I4819 Other persistent atrial fibrillation: Secondary | ICD-10-CM | POA: Diagnosis not present

## 2020-05-28 DIAGNOSIS — D649 Anemia, unspecified: Secondary | ICD-10-CM | POA: Diagnosis not present

## 2020-05-28 DIAGNOSIS — D6869 Other thrombophilia: Secondary | ICD-10-CM | POA: Diagnosis not present

## 2020-05-28 DIAGNOSIS — C679 Malignant neoplasm of bladder, unspecified: Secondary | ICD-10-CM | POA: Insufficient documentation

## 2020-05-28 DIAGNOSIS — Z87891 Personal history of nicotine dependence: Secondary | ICD-10-CM | POA: Diagnosis not present

## 2020-05-28 DIAGNOSIS — Z79899 Other long term (current) drug therapy: Secondary | ICD-10-CM | POA: Insufficient documentation

## 2020-05-28 DIAGNOSIS — C772 Secondary and unspecified malignant neoplasm of intra-abdominal lymph nodes: Secondary | ICD-10-CM | POA: Diagnosis not present

## 2020-05-28 DIAGNOSIS — I10 Essential (primary) hypertension: Secondary | ICD-10-CM | POA: Diagnosis not present

## 2020-05-28 DIAGNOSIS — Z7901 Long term (current) use of anticoagulants: Secondary | ICD-10-CM | POA: Diagnosis not present

## 2020-05-28 DIAGNOSIS — Z9221 Personal history of antineoplastic chemotherapy: Secondary | ICD-10-CM | POA: Diagnosis not present

## 2020-05-28 MED ORDER — AMIODARONE HCL 200 MG PO TABS: 200.0000 mg | ORAL_TABLET | Freq: Every day | ORAL | Status: DC

## 2020-05-28 NOTE — Progress Notes (Signed)
Primary Care Physician: Johny Blamer, MD Referring Physician: Dr. Rennis Golden Cardiologist: Dr. Rennis Golden  Oncologist : Dr. Myna Hidalgo Urologist: Dr. Yetta Numbers is a 70 y.o. 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 Rennis Golden as pt had recent cardioversion with ERAF. In his note he suggested use of flecainide and recent stress test was low risk on review of Dr. Rennis Golden.   The pt does c/o of a lot of fatigue but has other issues as well contributing. He is rate controlled. He is on eliquis 5 mg bid for a CHA2DS2VASc Score of 2. Heis pending a bladder resection on 4/26. His last creatinine was 2.11.  Pt had pending resection of bladder pending 4/26. His BB was increased prior to surgery to have better rate control. It was not an appropriate time to try to restore SR as I am sure chemical or electrical cardioversion would have been needed and that meant he would have to delay surgery as he would not be able to come off anticoagulation x 4 weeks. He would not have been able to use flecainide as it interfered with 4 of his medicines.   He returns today, 08/15/19 as physical therapy found his HR increased too much with activity and he  paused therapy. He is rate controlled today at 92 mbp. His BP is soft at 106/64, unable to increase rate control for this finding. He started back on anticoagulation right after his bladder resection 4/26 but had to stop it the following  Monday as he had increase hematuria. Hematuria  is improved off anticoagulation. He is c/o of being short of breath with exertion and weak, more so since his procedure. Marland Kitchen He feels terrible. He rolled out of bed last pm hitting his left eye on the bed stand and scraping his  rt knee. He was aware of his surroundings when he hit the floor. No  LOC. His hgb was found to be 7.5 and urology arranged for an outpt transfusion.   He is now  returned to afib clinic, 6/1,  to discuss tikosyn admit. Marland Kitchen He is feeling well better since infusion. He has seen minimal hematuria. He continues with fatigue and exertional dyspnea. He chemo was put on hold 2 weeks ago as he felt poorly. He feels it will resume next week. He remains in rate control afib. He has received both covid shots.   F/u in afib clinic for tikosyn admit, 6/15. He was to have his chemo last week but the oncologist said that he would delay until after Tikosyn load. No benadryl or no missed  DOAC for 3 weeks. No significant hematuria.   F/u in afib clinic, 6/25. He is here one week s/p Tikosyn. He had steroids and chemo on  Tues/Wed  but when he went on Thursday, he was noted to have bigeminy PVC's and chemo was not given. He was in rhythm. He does feel better in SR. He can walk further without any shortness of breath. The PVC's have been present before Tikosyn start and he was often in bigeminy PVC's while in the hospital, qtc is stable. I discussed with Dr.Allred, he suggested an updated echo but otherwise is not overly concerned as they were there  prior to tikosyn.   F/u in afib clinic, 11/13/19. He is being seen as he felt poorly yesterday and noted HR of 120 after walking. He was  questioning if he was back in Afib. His EKG shows SR today.  He has not had chemo x 2 weeks. Last  HGB also showed  a drop to 9.8 from  11.8 one month earlier at the [facility].  He also had a low K+, being treated with extra K+ with plans to recheck next week, being followed by Tommye Standard, PA. He continues on dofetilide and apixaban for a CHA2DS2VASc of 2.   F/u in afib clinic, 01/28/20. He remains in SR with PVC's, which are persistent for pt and preceded Tikosyn load. Overall, he feels well. He could have his recent chemo as his pts were low. He is to report again today to see if he can have chemo. No bleeding issues on eliquis.  F/u in afib clinic, 04/24/19. He  had a hospitalization early November  with dx of fall, anemia, renal failure and sepsis. Tikosyn was put on hold 2/2 of renal decline.. Afib was rate controlled. He saw Dr. Rayann Heman in f/u 03/24/20, and it was decided to start amiodarone load with plans for cardioversion one month later if reminded in afib. He called clinic last week with bradycardia with HR's in the 40's and diltiazem was stopped. . EKG now shows Sinus rhythm at 73 bpm. He is disappointed that he is in rhythm and still is fatigued and short of breath with activities. I feel these symptoms are multifactorial due  to his significant other health issues.   F/u in afib clinic, 05/28/20. He remains in SR and is now on amiodarone 200 mg daily. He is undergoing PT to build up his strength. Creatinine  is over 5 and the pt states he may have to consider going on dialysis in the near future.     Today, he denies symptoms of palpitations, chest pain, shortness of breath, orthopnea, PND, lower extremity edema, dizziness, presyncope, syncope, or neurologic sequela.++ fatigue. The patient is tolerating medications without difficulties and is otherwise without complaint today.   Past Medical History:  Diagnosis Date  . Anemia   . Arthritis   . 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 ([place]) 09/29/2016  . Bladder tumor   . Chronic renal insufficiency    stage IV  . Dyspnea    with exertion  . Essential hypertension 06/23/2018  . Goals of care, counseling/discussion 09/30/2016  . History of prostate cancer followed by pcp dr [name]-  per pt last PSA undetectable   dx 2008-- (Stage T1c, Gleason 3+3,  PSA 4.58, vol 99cc)  s/p  radical prostatectomy (nerve sparing bilateral)   . Hyperglycemia 06/23/2018  . Hypertension   . Mild hyperlipidemia 06/23/2018  . Persistent atrial fibrillation (HCC)    atrial fib  . Pre-diabetes   . Wears glasses    Past Surgical History:  Procedure Laterality Date  . CARDIOVERSION N/A 05/18/2019   Procedure: CARDIOVERSION;  Surgeon:  Lelon Perla, MD;  Location: [facility] ENDOSCOPY;  Service: Cardiovascular;  Laterality: N/A;  . CARDIOVERSION N/A 09/27/2019   Procedure: CARDIOVERSION;  Surgeon: Donato Heinz, MD;  Location: [place];  Service: Cardiovascular;  Laterality: N/A;  . CATARACT EXTRACTION W/ INTRAOCULAR LENS  IMPLANT, BILATERAL Bilateral 2011  . [place]  . ESOPHAGOGASTRODUODENOSCOPY N/A 02/19/2020   Procedure: ESOPHAGOGASTRODUODENOSCOPY (EGD);  Surgeon: Wilford Corner, MD;  Location: [place];  Service: Gastroenterology;  Laterality: N/A;  . GIVENS CAPSULE STUDY N/A 02/19/2020   Procedure: GIVENS CAPSULE STUDY;  Surgeon: Wilford Corner, MD;  Location: [place];  Service: Gastroenterology;  Laterality: N/A;  . IR FLUORO GUIDE PORT INSERTION RIGHT  10/07/2016  . IR RADIOLOGIST EVAL & MGMT  01/05/2018  . IR US GUIDE VASC ACCESS RIGHT  10/07/2016  . KNEE ARTHROSCOPY Bilateral right 2006;  left 02-15-2007  . [place];  1990;  1983  . ROBOT ASSISTED LAPAROSCOPIC RADICAL PROSTATECTOMY  06/20/2006   bilateral nerve sparing  . TEE WITHOUT CARDIOVERSION N/A 06/27/2018   Procedure: TRANSESOPHAGEAL ECHOCARDIOGRAM (TEE);  Surgeon: Nigel Mormon, MD;  Location: [facility] ENDOSCOPY;  Service: Cardiovascular;  Laterality: N/A;  . TOTAL HIP ARTHROPLASTY Left 03/03/2015   Procedure: LEFT TOTAL HIP ARTHROPLASTY ANTERIOR APPROACH;  Surgeon: Dorna Leitz, MD;  Location: [place];  Service: Orthopedics;  Laterality: Left;  . TOTAL KNEE ARTHROPLASTY Bilateral left 08-13-2009;  right 12-26-2009  . TRANSURETHRAL RESECTION OF BLADDER TUMOR N/A 09/20/2016   Procedure: TRANSURETHRAL RESECTION OF BLADDER TUMOR (TURBT);  Surgeon: Franchot Gallo, MD;  Location: [facility];  Service: Urology;  Laterality: N/A;  . TRANSURETHRAL RESECTION OF BLADDER TUMOR WITH MITOMYCIN-C N/A 08/06/2019   Procedure: TRANSURETHRAL RESECTION OF BLADDER TUMOR;  Surgeon: Franchot Gallo, MD;   Location: [facility];  Service: Urology;  Laterality: N/A;    Current Outpatient Medications  Medication Sig Dispense Refill  . acetaminophen (TYLENOL) 500 MG tablet Take 1,000 mg by mouth every 6 (six) hours as needed for mild pain or headache.    Marland Kitchen apixaban (ELIQUIS) 5 MG TABS tablet Take 1 tablet (5 mg total) by mouth 2 (two) times daily. 60 tablet 6  . atorvastatin (LIPITOR) 40 MG tablet Take 40 mg by mouth every evening.     . cyclobenzaprine (FEXMID) 7.5 MG tablet Take 1 tablet (7.5 mg total) by mouth 3 (three) times daily as needed for muscle spasms. 60 tablet 2  . dexamethasone (DECADRON) 4 MG tablet Take 2 tablets (8 mg) daily for 3 days after chemotherapy. Take with food. 30 tablet 1  . DULoxetine (CYMBALTA) 60 MG capsule TAKE ONE CAPSULE BY MOUTH DAILY 30 capsule 3  . famotidine (PEPCID) 20 MG tablet Take 20 mg by mouth 2 (two) times daily.    Marland Kitchen lidocaine-prilocaine (EMLA) cream Apply to affected area once 30 g 3  . loperamide (IMODIUM A-D) 2 MG tablet Take 2 at diarrhea onset, then 1 every 2hr until 12hrs with no BM. May take 2 every 4hrs at night. If diarrhea recurs repeat. 100 tablet 1  . LORazepam (ATIVAN) 0.5 MG tablet Take 0.5 mg by mouth every 6 (six) hours as needed.    . magnesium oxide (MAG-OX) 400 MG tablet Take 1 tablet (400 mg total) by mouth 2 (two) times daily.    . metoprolol tartrate (LOPRESSOR) 25 MG tablet Take 25 mg by mouth 2 (two) times daily.    Marland Kitchen oxybutynin (DITROPAN-XL) 10 MG 24 hr tablet Take 10 mg by mouth at bedtime.    . potassium chloride (KLOR-CON) 10 MEQ tablet Take 2 tablets (20 mEq total) by mouth daily. (Patient taking differently: Take 40 mEq by mouth daily.) 180 tablet 3  . prochlorperazine (COMPAZINE) 10 MG tablet Take 1 tablet (10 mg total) by mouth every 6 (six) hours as needed (Nausea or vomiting). 30 tablet 1  . pyridoxine (B-6) 100 MG tablet Take 200 mg by mouth daily with breakfast.     . sodium bicarbonate 650 MG tablet  Take 2 tablets (1,300 mg total) by mouth 2 (two) times daily. 120 tablet 1  .  Vitamin D, Ergocalciferol, (DRISDOL) 1.25 MG (50000 UNIT) CAPS capsule Take 50,000 Units by mouth once a week.    Marland Kitchen amiodarone (PACERONE) 200 MG tablet Take 1 tablet (200 mg total) by mouth daily.     No current facility-administered medications for this encounter.    No Known Allergies  Social History   Socioeconomic History  . Marital status: Married    Spouse name: Not on file  . Number of children: Not on file  . Years of education: Not on file  . Highest education level: Not on file  Occupational History  . Not on file  Tobacco Use  . Smoking status: Former Smoker    Packs/day: 1.00    Years: 16.00    Pack years: 16.00    Types: Cigarettes    Quit date: 09/25/1984    Years since quitting: 35.6  . Smokeless tobacco: Never Used  Vaping Use  . Vaping Use: Never used  Substance and Sexual Activity  . Alcohol use: Yes    Comment: occasionally  . Drug use: Yes    Types: Marijuana    Comment: after chemo to help sleep  . Sexual activity: Not on file  Other Topics Concern  . Not on file  Social History Narrative  . Not on file   Social Determinants of Health   Financial Resource Strain: Not on file  Food Insecurity: No Food Insecurity  . Worried About Charity fundraiser in the Last Year: Never true  . Ran Out of Food in the Last Year: Never true  Transportation Needs: No Transportation Needs  . Lack of Transportation (Medical): No  . Lack of Transportation (Non-Medical): No  Physical Activity: Not on file  Stress: Not on file  Social Connections: Not on file  Intimate Partner Violence: Not on file    Family History  Problem Relation Age of Onset  . Hypertension Mother   . Aneurysm Mother   . Emphysema Father   . Hypertension Father     ROS- All systems are reviewed and negative except as per the HPI above  Physical Exam: Vitals:   05/28/20 1012  BP: (!) 144/84  Pulse: 63   Weight: 114.9 kg  Height: $Remove'6\' 2"'[id number]$  (1.88 m)   Wt Readings from Last 3 Encounters:  05/28/20 114.9 kg  04/28/20 115.2 kg  04/23/20 115.3 kg    Labs: Lab Results  Component Value Date   NA 131 (L) 04/28/2020   K 3.9 04/28/2020   CL 99 04/28/2020   CO2 22 04/28/2020   GLUCOSE 111 (H) 04/28/2020   BUN 48 (H) 04/28/2020   CREATININE 5.16 (HH) 04/28/2020   CALCIUM 9.2 04/28/2020   PHOS 3.6 04/28/2020   MG 2.0 04/28/2020   Lab Results  Component Value Date   INR 1.5 (H) 02/18/2020   Lab Results  Component Value Date   CHOL 159 10/05/2018   HDL 47 10/05/2018   LDLCALC 86 10/05/2018   TRIG 131 10/05/2018     GEN- The patient is well appearing, alert and oriented x 3 today.   Head- normocephalic, atraumatic Eyes-  Sclera clear, conjunctiva pink Ears- hearing intact Oropharynx- clear Neck- supple, no JVP Lymph- no cervical lymphadenopathy Lungs- Clear to ausculation bilaterally, normal work of breathing Heart- regular rate and rhythm(pvc's), no murmurs, rubs or gallops, PMI not laterally displaced GI- soft, NT, ND, + BS Extremities- no clubbing, cyanosis, or edema MS- no significant deformity or atrophy Skin- no rash or lesion Psych- euthymic mood,  full affect Neuro- strength and sensation are intact  EKG- Sinus rhythm at 63 bpm,with first degree AV block  pr int 248 ms, qrs int 117ms, qtc 444 ms  Epic records reviewed  Echo-1. The left ventricle has normal systolic function, with an ejection  fraction of 55-60%. The cavity size was normal. Left ventricular diastolic  Doppler parameters are consistent with impaired relaxation.  2. The right ventricle has normal systolic function. The cavity was  mildly enlarged. There is no increase in right ventricular wall thickness.  3. Right atrial size was mildly dilated.  4. Moderate thickening of the aortic valve Mild calcification of the  aortic valve.  5. There is dilatation of the aortic root measuring 39 mm.  6. No  vegetations detected.   Assessment and Plan: 1.  Persistent  Afib Off tikosyn for CKD He is in rhythm after amiodarone loading since 12/13 He is now on 200 mg amiodarone daily  Off diltiazem since  when his HR's were noted in the 40's, likely converted to SR at that ime Continue  metoprolol 25 mg bid    2. CHA2DS2VASc score of 2  Continue eliquis 5 mg bid   3. Bladder CA Per urology and oncology   4. Chronic anemia/Low plts  Per oncology  F/u with [name] in 3 months as scheduled  Will need a cmet/tsh at that time for amiodarone surveillance  if otherwise have not been drawn    [place]. Destynee Stringfellow, [facility] [address] [place], [place] [address] [phone]

## 2020-05-29 DIAGNOSIS — G629 Polyneuropathy, unspecified: Secondary | ICD-10-CM | POA: Diagnosis not present

## 2020-05-29 DIAGNOSIS — R531 Weakness: Secondary | ICD-10-CM | POA: Diagnosis not present

## 2020-05-29 DIAGNOSIS — R5381 Other malaise: Secondary | ICD-10-CM | POA: Diagnosis not present

## 2020-05-29 DIAGNOSIS — R2689 Other abnormalities of gait and mobility: Secondary | ICD-10-CM | POA: Diagnosis not present

## 2020-05-29 DIAGNOSIS — Z7409 Other reduced mobility: Secondary | ICD-10-CM | POA: Diagnosis not present

## 2020-06-02 DIAGNOSIS — R5381 Other malaise: Secondary | ICD-10-CM | POA: Diagnosis not present

## 2020-06-02 DIAGNOSIS — G629 Polyneuropathy, unspecified: Secondary | ICD-10-CM | POA: Diagnosis not present

## 2020-06-02 DIAGNOSIS — Z7409 Other reduced mobility: Secondary | ICD-10-CM | POA: Diagnosis not present

## 2020-06-02 DIAGNOSIS — R2689 Other abnormalities of gait and mobility: Secondary | ICD-10-CM | POA: Diagnosis not present

## 2020-06-02 DIAGNOSIS — R531 Weakness: Secondary | ICD-10-CM | POA: Diagnosis not present

## 2020-06-04 ENCOUNTER — Other Ambulatory Visit: Payer: Self-pay

## 2020-06-04 ENCOUNTER — Ambulatory Visit (HOSPITAL_COMMUNITY)
Admission: RE | Admit: 2020-06-04 | Discharge: 2020-06-04 | Disposition: A | Discharge status: Home / Self Care | Payer: PPO | Source: Ambulatory Visit | Attending: Hematology & Oncology | Admitting: Hematology & Oncology

## 2020-06-04 DIAGNOSIS — C772 Secondary and unspecified malignant neoplasm of intra-abdominal lymph nodes: Secondary | ICD-10-CM | POA: Diagnosis not present

## 2020-06-04 DIAGNOSIS — I700 Atherosclerosis of aorta: Secondary | ICD-10-CM | POA: Diagnosis not present

## 2020-06-04 DIAGNOSIS — J439 Emphysema, unspecified: Secondary | ICD-10-CM | POA: Diagnosis not present

## 2020-06-04 DIAGNOSIS — R5381 Other malaise: Secondary | ICD-10-CM | POA: Diagnosis not present

## 2020-06-04 DIAGNOSIS — Z7409 Other reduced mobility: Secondary | ICD-10-CM | POA: Diagnosis not present

## 2020-06-04 DIAGNOSIS — R2689 Other abnormalities of gait and mobility: Secondary | ICD-10-CM | POA: Diagnosis not present

## 2020-06-04 DIAGNOSIS — R531 Weakness: Secondary | ICD-10-CM | POA: Diagnosis not present

## 2020-06-04 DIAGNOSIS — G629 Polyneuropathy, unspecified: Secondary | ICD-10-CM | POA: Diagnosis not present

## 2020-06-04 DIAGNOSIS — C679 Malignant neoplasm of bladder, unspecified: Secondary | ICD-10-CM | POA: Diagnosis not present

## 2020-06-04 LAB — GLUCOSE, CAPILLARY: Glucose-Capillary: 122 mg/dL — ABNORMAL HIGH (ref 70–99)

## 2020-06-04 MED ORDER — FLUDEOXYGLUCOSE F - 18 (FDG) INJECTION
13.1000 | Freq: Once | INTRAVENOUS | Status: AC | PRN
  Administered 2020-06-04: 11.9 via INTRAVENOUS

## 2020-06-09 ENCOUNTER — Inpatient Hospital Stay: Payer: PPO | Attending: Hematology & Oncology

## 2020-06-09 ENCOUNTER — Inpatient Hospital Stay: Payer: PPO

## 2020-06-09 ENCOUNTER — Other Ambulatory Visit: Payer: Self-pay

## 2020-06-09 ENCOUNTER — Encounter: Payer: Self-pay | Admitting: Hematology & Oncology

## 2020-06-09 ENCOUNTER — Telehealth: Payer: Self-pay | Admitting: *Deleted

## 2020-06-09 ENCOUNTER — Telehealth: Payer: Self-pay

## 2020-06-09 ENCOUNTER — Inpatient Hospital Stay (HOSPITAL_BASED_OUTPATIENT_CLINIC_OR_DEPARTMENT_OTHER): Payer: PPO | Admitting: Hematology & Oncology

## 2020-06-09 VITALS — BP 123/68 | HR 67 | Temp 97.8°F | Resp 20 | Wt 250.1 lb

## 2020-06-09 DIAGNOSIS — D631 Anemia in chronic kidney disease: Secondary | ICD-10-CM | POA: Diagnosis not present

## 2020-06-09 DIAGNOSIS — C772 Secondary and unspecified malignant neoplasm of intra-abdominal lymph nodes: Secondary | ICD-10-CM | POA: Diagnosis not present

## 2020-06-09 DIAGNOSIS — C679 Malignant neoplasm of bladder, unspecified: Secondary | ICD-10-CM | POA: Insufficient documentation

## 2020-06-09 DIAGNOSIS — Z7901 Long term (current) use of anticoagulants: Secondary | ICD-10-CM | POA: Insufficient documentation

## 2020-06-09 DIAGNOSIS — I4891 Unspecified atrial fibrillation: Secondary | ICD-10-CM | POA: Diagnosis not present

## 2020-06-09 DIAGNOSIS — N289 Disorder of kidney and ureter, unspecified: Secondary | ICD-10-CM | POA: Insufficient documentation

## 2020-06-09 DIAGNOSIS — Z95828 Presence of other vascular implants and grafts: Secondary | ICD-10-CM

## 2020-06-09 LAB — RETICULOCYTES
Immature Retic Fract: 13.2 % (ref 2.3–15.9)
RBC.: 2.88 MIL/uL — ABNORMAL LOW (ref 4.22–5.81)
Retic Count, Absolute: 61.6 10*3/uL (ref 19.0–186.0)
Retic Ct Pct: 2.1 % (ref 0.4–3.1)

## 2020-06-09 LAB — CBC WITH DIFFERENTIAL (CANCER CENTER ONLY)
Abs Immature Granulocytes: 0.1 10*3/uL — ABNORMAL HIGH (ref 0.00–0.07)
Basophils Absolute: 0 10*3/uL (ref 0.0–0.1)
Basophils Relative: 0 %
Eosinophils Absolute: 0.3 10*3/uL (ref 0.0–0.5)
Eosinophils Relative: 3 %
HCT: 28.8 % — ABNORMAL LOW (ref 39.0–52.0)
Hemoglobin: 9.4 g/dL — ABNORMAL LOW (ref 13.0–17.0)
Immature Granulocytes: 1 %
Lymphocytes Relative: 15 %
Lymphs Abs: 1.5 10*3/uL (ref 0.7–4.0)
MCH: 32.4 pg (ref 26.0–34.0)
MCHC: 32.6 g/dL (ref 30.0–36.0)
MCV: 99.3 fL (ref 80.0–100.0)
Monocytes Absolute: 1.1 10*3/uL — ABNORMAL HIGH (ref 0.1–1.0)
Monocytes Relative: 11 %
Neutro Abs: 6.8 10*3/uL (ref 1.7–7.7)
Neutrophils Relative %: 70 %
Platelet Count: 138 10*3/uL — ABNORMAL LOW (ref 150–400)
RBC: 2.9 MIL/uL — ABNORMAL LOW (ref 4.22–5.81)
RDW: 13.6 % (ref 11.5–15.5)
WBC Count: 9.8 10*3/uL (ref 4.0–10.5)
nRBC: 0 % (ref 0.0–0.2)

## 2020-06-09 LAB — CMP (CANCER CENTER ONLY)
ALT: 25 U/L (ref 0–44)
AST: 29 U/L (ref 15–41)
Albumin: 3.9 g/dL (ref 3.5–5.0)
Alkaline Phosphatase: 82 U/L (ref 38–126)
Anion gap: 11 (ref 5–15)
BUN: 64 mg/dL — ABNORMAL HIGH (ref 8–23)
CO2: 21 mmol/L — ABNORMAL LOW (ref 22–32)
Calcium: 9.2 mg/dL (ref 8.9–10.3)
Chloride: 100 mmol/L (ref 98–111)
Creatinine: 5.85 mg/dL (ref 0.61–1.24)
GFR, Estimated: 10 mL/min — ABNORMAL LOW (ref >60)
Glucose, Bld: 121 mg/dL — ABNORMAL HIGH (ref 70–99)
Potassium: 4.3 mmol/L (ref 3.5–5.1)
Sodium: 132 mmol/L — ABNORMAL LOW (ref 135–145)
Total Bilirubin: 0.5 mg/dL (ref 0.3–1.2)
Total Protein: 7.1 g/dL (ref 6.5–8.1)

## 2020-06-09 LAB — PHOSPHORUS: Phosphorus: 4.6 mg/dL (ref 2.5–4.6)

## 2020-06-09 LAB — MAGNESIUM: Magnesium: 2 mg/dL (ref 1.7–2.4)

## 2020-06-09 LAB — LACTATE DEHYDROGENASE: LDH: 214 U/L — ABNORMAL HIGH (ref 98–192)

## 2020-06-09 MED ORDER — SODIUM CHLORIDE 0.9% FLUSH
10.0000 mL | Freq: Once | INTRAVENOUS | Status: AC
  Administered 2020-06-09: 10 mL via INTRAVENOUS
  Filled 2020-06-09: qty 10

## 2020-06-09 MED ORDER — HEPARIN SOD (PORK) LOCK FLUSH 100 UNIT/ML IV SOLN
500.0000 [IU] | Freq: Once | INTRAVENOUS | Status: AC
  Administered 2020-06-09: 500 [IU] via INTRAVENOUS
  Filled 2020-06-09: qty 5

## 2020-06-09 NOTE — Progress Notes (Signed)
Hematology and Oncology Follow Up Visit  [name] Rose [id number] [birth year] 70 y.o. 06/09/2020   Principle Diagnosis:  Metastatic high-grade bladder cancer - recurrent Anemia of renal insufficiency Diverticular abscess - E.coli  Past Therapy: Atezolizumab 1200mg  IV q 3 wks - s/p cycle #4 - d/c due to progression. Taxotere 80mg /m2 IV q 3 wks - s/p cycle #3 - d/c due to progression M-VAC - s/p cycle#8 -- d/c on 07/10/2018 due to blood counts Padcev -- s/p cycle #1 on 04/06/2020d/c 08/28/2018  Current Therapy: Trodelvy8 mg/kg IV d1&8 -- s/p cycle #3  -- re- start on 07/06/2020 Aranesp 300 mcg sq q 3 week for Hgb < 11 Ifosfamide/Gemzar -- s/p cycle #9- started 04/16/2019  -- d/c on 12/21/2019 due to progression IV iron as indicated   Interim History:  [name] Rose is here today for follow-up.  He comes in with his wife.  Unfortunately, his malignancy is clearly progressing.  We did do a PET scan on him.  This was done last week.  The PET scan does show progressive disease.  So far, the disease has only been in his lymph nodes.  He has not been on treatment now for almost 5 months.  He was on Trodelvy.  This was working quite well for him.  However, he got into trouble with pancytopenia and was hospitalized with a I think E. coli bacteremia.  Our options for him for treatment are just not that great.  He does have poor renal function.  It sounds like he may be going on dialysis at some point in the near future.  Since [name] Rose was working, I will try to get him back onto this.  I will have to make a dosage reduction.  He clearly is going to need Neulasta after day 8 of Trodelvy in order to maintain his neutrophil count.  His heart is doing well.  He does have atrial fibrillation.  He is on Eliquis and amiodarone.  His appetite is doing okay.  He has had no nausea or vomiting.  He has had no diarrhea.  He has had no abdominal discomfort.  He does have bad neuropathy in  his feet.  He is trying to get around.  Overall, I would have to say that his performance status right now is ECOG 1.      Medications:  Allergies as of 06/09/2020   No Known Allergies     Medication List       Accurate as of June 09, 2020  2:23 PM. If you have any questions, ask your nurse or doctor.        STOP taking these medications   dexamethasone 4 MG tablet Commonly known as: DECADRON Stopped by: Volanda Napoleon, MD     TAKE these medications   acetaminophen 500 MG tablet Commonly known as: TYLENOL Take 1,000 mg by mouth every 6 (six) hours as needed for mild pain or headache.   amiodarone 200 MG tablet Commonly known as: PACERONE Take 1 tablet (200 mg total) by mouth daily.   apixaban 5 MG Tabs tablet Commonly known as: Eliquis Take 1 tablet (5 mg total) by mouth 2 (two) times daily.   atorvastatin 40 MG tablet Commonly known as: LIPITOR Take 40 mg by mouth every evening.   cyclobenzaprine 7.5 MG tablet Commonly known as: FEXMID Take 1 tablet (7.5 mg total) by mouth 3 (three) times daily as needed for muscle spasms.   DULoxetine 60 MG capsule Commonly known as: CYMBALTA TAKE  ONE CAPSULE BY MOUTH DAILY   famotidine 20 MG tablet Commonly known as: PEPCID Take 20 mg by mouth 2 (two) times daily.   lidocaine-prilocaine cream Commonly known as: EMLA Apply to affected area once   loperamide 2 MG tablet Commonly known as: IMODIUM A-D Take 2 at diarrhea onset, then 1 every 2hr until 12hrs with no BM. May take 2 every 4hrs at night. If diarrhea recurs repeat.   LORazepam 0.5 MG tablet Commonly known as: ATIVAN Take 0.5 mg by mouth every 6 (six) hours as needed.   magnesium oxide 400 MG tablet Commonly known as: MAG-OX Take 1 tablet (400 mg total) by mouth 2 (two) times daily.   metoprolol tartrate 25 MG tablet Commonly known as: LOPRESSOR Take 25 mg by mouth 2 (two) times daily.   oxybutynin 10 MG 24 hr tablet Commonly known as:  DITROPAN-XL Take 10 mg by mouth at bedtime.   potassium chloride 10 MEQ tablet Commonly known as: KLOR-CON Take 2 tablets (20 mEq total) by mouth daily. What changed: how much to take   prochlorperazine 10 MG tablet Commonly known as: COMPAZINE Take 1 tablet (10 mg total) by mouth every 6 (six) hours as needed (Nausea or vomiting).   pyridoxine 100 MG tablet Commonly known as: B-6 Take 200 mg by mouth daily with breakfast.   sodium bicarbonate 650 MG tablet Take 2 tablets (1,300 mg total) by mouth 2 (two) times daily.   Vitamin D (Ergocalciferol) 1.25 MG (50000 UNIT) Caps capsule Commonly known as: DRISDOL Take 50,000 Units by mouth once a week.       Allergies: No Known Allergies  Past Medical History, Surgical history, Social history, and Family History were reviewed and updated.  Review of Systems: Review of Systems  Constitutional: Negative.   HENT: Negative.   Eyes: Negative.   Respiratory: Negative.   Cardiovascular: Positive for palpitations.  Gastrointestinal: Positive for nausea.  Genitourinary: Negative.   Musculoskeletal: Positive for myalgias.  Skin: Negative.   Neurological: Positive for tingling.  Endo/Heme/Allergies: Negative.   Psychiatric/Behavioral: Negative.      Physical Exam:  weight is 250 lb 1.9 oz (113.5 kg). His oral temperature is 97.8 F (36.6 C). His blood pressure is 123/68 and his pulse is 67. His respiration is 20 and oxygen saturation is 100%.   Wt Readings from Last 3 Encounters:  06/09/20 250 lb 1.9 oz (113.5 kg)  05/28/20 253 lb 6.4 oz (114.9 kg)  04/28/20 254 lb (115.2 kg)    Physical Exam Vitals reviewed.  HENT:     Head: Normocephalic and atraumatic.  Eyes:     Pupils: Pupils are equal, round, and reactive to light.  Cardiovascular:     Rate and Rhythm: Tachycardia present. Rhythm irregular.     Heart sounds: Normal heart sounds.     Comments: Cardiac exam is tachycardic and irregular consistent with the atrial  fibrillation.  There are no murmurs. Pulmonary:     Effort: Pulmonary effort is normal.     Breath sounds: Normal breath sounds.  Abdominal:     General: Bowel sounds are normal.     Palpations: Abdomen is soft.  Musculoskeletal:        General: No tenderness or deformity. Normal range of motion.     Cervical back: Normal range of motion.  Lymphadenopathy:     Cervical: No cervical adenopathy.  Skin:    General: Skin is warm and dry.     Findings: No erythema or rash.  Neurological:  Mental Status: He is alert and oriented to person, place, and time.  Psychiatric:        Behavior: Behavior normal.        Thought Content: Thought content normal.        Judgment: Judgment normal.      Lab Results  Component Value Date   WBC 9.8 06/09/2020   HGB 9.4 (L) 06/09/2020   HCT 28.8 (L) 06/09/2020   MCV 99.3 06/09/2020   PLT 138 (L) 06/09/2020   Lab Results  Component Value Date   FERRITIN 1,015 (H) 04/28/2020   IRON 55 04/28/2020   TIBC 241 (L) 04/28/2020   UIBC 186 04/28/2020   IRONPCTSAT 23 04/28/2020   Lab Results  Component Value Date   RETICCTPCT 2.1 06/09/2020   RBC 2.90 (L) 06/09/2020   RBC 2.88 (L) 06/09/2020   No results found for: KPAFRELGTCHN, LAMBDASER, KAPLAMBRATIO No results found for: IGGSERUM, IGA, IGMSERUM No results found for: Kathrynn Ducking, MSPIKE, SPEI   Chemistry      Component Value Date/Time   NA 131 (L) 04/28/2020 1200   NA 140 12/18/2019 1411   NA 141 02/21/2017 0902   K 3.9 04/28/2020 1200   K 4.1 02/21/2017 0902   CL 99 04/28/2020 1200   CL 98 02/21/2017 0902   CO2 22 04/28/2020 1200   CO2 31 02/21/2017 0902   BUN 48 (H) 04/28/2020 1200   BUN 25 12/18/2019 1411   BUN 17 02/21/2017 0902   CREATININE 5.16 (HH) 04/28/2020 1200   CREATININE 1.2 02/21/2017 0902      Component Value Date/Time   CALCIUM 9.2 04/28/2020 1200   CALCIUM 9.2 02/21/2017 0902   ALKPHOS 95 04/28/2020 1200    ALKPHOS 65 02/21/2017 0902   AST 29 04/28/2020 1200   ALT 25 04/28/2020 1200   ALT 18 02/21/2017 0902   BILITOT 0.5 04/28/2020 1200       Impression and Plan: Mr. Popov is a very pleasant 70yo caucasian gentleman with metastatic high grade bladder cancer.  He has progressive disease.  Again, has not been treated since October.  I think we probably could get him back onto [place].  I do think this is going to be a problem with suspect it was renal insufficiency.  I talked to our pharmacist about this.  I will decrease his dose just to make sure that he does not run into problems with neutropenia.  Again since he just has disease in his lymph nodes, I would think that he should respond to treatment.  Again we are dealing with quality of life issues.  I just want to make sure that we keep his quality of life at the top of the list.  We will go ahead and get started next week.  We will give him Neulasta after day 8 of treatment.  I will see him back on the day of his second cycle of treatment.  I will try to give him 3 cycles and then we will see how he is responding.   We also give him Aranesp.  I would not give him Aranesp today.  We will see about Aranesp next week.   Volanda Napoleon, MD 2/28/20222:23 PM

## 2020-06-09 NOTE — Telephone Encounter (Signed)
appts made and calendars printed for pt per 06/09/20 los

## 2020-06-09 NOTE — Telephone Encounter (Signed)
[name] notified of creat-5.85.  No new orders received at this time.

## 2020-06-10 ENCOUNTER — Other Ambulatory Visit: Payer: Self-pay | Admitting: Hematology & Oncology

## 2020-06-10 DIAGNOSIS — Z8546 Personal history of malignant neoplasm of prostate: Secondary | ICD-10-CM | POA: Diagnosis not present

## 2020-06-10 DIAGNOSIS — Z7409 Other reduced mobility: Secondary | ICD-10-CM | POA: Diagnosis not present

## 2020-06-10 DIAGNOSIS — D61818 Other pancytopenia: Secondary | ICD-10-CM | POA: Diagnosis not present

## 2020-06-10 DIAGNOSIS — R5381 Other malaise: Secondary | ICD-10-CM | POA: Diagnosis not present

## 2020-06-10 DIAGNOSIS — I10 Essential (primary) hypertension: Secondary | ICD-10-CM | POA: Diagnosis not present

## 2020-06-10 DIAGNOSIS — I4891 Unspecified atrial fibrillation: Secondary | ICD-10-CM | POA: Diagnosis not present

## 2020-06-10 DIAGNOSIS — D630 Anemia in neoplastic disease: Secondary | ICD-10-CM | POA: Diagnosis not present

## 2020-06-10 DIAGNOSIS — N186 End stage renal disease: Secondary | ICD-10-CM | POA: Diagnosis not present

## 2020-06-10 DIAGNOSIS — I480 Paroxysmal atrial fibrillation: Secondary | ICD-10-CM | POA: Diagnosis not present

## 2020-06-10 DIAGNOSIS — G629 Polyneuropathy, unspecified: Secondary | ICD-10-CM | POA: Diagnosis not present

## 2020-06-10 DIAGNOSIS — R2689 Other abnormalities of gait and mobility: Secondary | ICD-10-CM | POA: Diagnosis not present

## 2020-06-10 DIAGNOSIS — N184 Chronic kidney disease, stage 4 (severe): Secondary | ICD-10-CM | POA: Diagnosis not present

## 2020-06-10 DIAGNOSIS — R531 Weakness: Secondary | ICD-10-CM | POA: Diagnosis not present

## 2020-06-10 DIAGNOSIS — D62 Acute posthemorrhagic anemia: Secondary | ICD-10-CM | POA: Diagnosis not present

## 2020-06-10 DIAGNOSIS — E7800 Pure hypercholesterolemia, unspecified: Secondary | ICD-10-CM | POA: Diagnosis not present

## 2020-06-10 DIAGNOSIS — I1311 Hypertensive heart and chronic kidney disease without heart failure, with stage 5 chronic kidney disease, or end stage renal disease: Secondary | ICD-10-CM | POA: Diagnosis not present

## 2020-06-10 LAB — IRON AND TIBC
Iron: 87 ug/dL (ref 42–163)
Saturation Ratios: 38 % (ref 20–55)
TIBC: 231 ug/dL (ref 202–409)
UIBC: 144 ug/dL (ref 117–376)

## 2020-06-10 LAB — FERRITIN: Ferritin: 1346 ng/mL — ABNORMAL HIGH (ref 24–336)

## 2020-06-12 ENCOUNTER — Other Ambulatory Visit (HOSPITAL_COMMUNITY): Payer: Self-pay | Admitting: *Deleted

## 2020-06-12 DIAGNOSIS — Z7409 Other reduced mobility: Secondary | ICD-10-CM | POA: Diagnosis not present

## 2020-06-12 DIAGNOSIS — R531 Weakness: Secondary | ICD-10-CM | POA: Diagnosis not present

## 2020-06-12 DIAGNOSIS — R2689 Other abnormalities of gait and mobility: Secondary | ICD-10-CM | POA: Diagnosis not present

## 2020-06-12 DIAGNOSIS — R5381 Other malaise: Secondary | ICD-10-CM | POA: Diagnosis not present

## 2020-06-12 DIAGNOSIS — G629 Polyneuropathy, unspecified: Secondary | ICD-10-CM | POA: Diagnosis not present

## 2020-06-12 MED ORDER — APIXABAN 5 MG PO TABS: 5.0000 mg | ORAL_TABLET | Freq: Two times a day (BID) | ORAL | 1 refills | Status: DC

## 2020-06-16 ENCOUNTER — Other Ambulatory Visit: Payer: Self-pay | Admitting: *Deleted

## 2020-06-16 ENCOUNTER — Inpatient Hospital Stay: Payer: PPO

## 2020-06-16 ENCOUNTER — Other Ambulatory Visit: Payer: Self-pay | Admitting: Family

## 2020-06-16 ENCOUNTER — Inpatient Hospital Stay (HOSPITAL_BASED_OUTPATIENT_CLINIC_OR_DEPARTMENT_OTHER): Payer: PPO | Admitting: Hematology & Oncology

## 2020-06-16 ENCOUNTER — Other Ambulatory Visit: Payer: Self-pay

## 2020-06-16 ENCOUNTER — Inpatient Hospital Stay: Payer: PPO | Attending: Hematology & Oncology

## 2020-06-16 ENCOUNTER — Telehealth: Payer: Self-pay | Admitting: *Deleted

## 2020-06-16 VITALS — BP 167/69 | HR 91 | Temp 98.1°F | Resp 18

## 2020-06-16 DIAGNOSIS — D631 Anemia in chronic kidney disease: Secondary | ICD-10-CM | POA: Insufficient documentation

## 2020-06-16 DIAGNOSIS — Z5112 Encounter for antineoplastic immunotherapy: Secondary | ICD-10-CM | POA: Insufficient documentation

## 2020-06-16 DIAGNOSIS — C679 Malignant neoplasm of bladder, unspecified: Secondary | ICD-10-CM | POA: Diagnosis not present

## 2020-06-16 DIAGNOSIS — N183 Chronic kidney disease, stage 3 unspecified: Secondary | ICD-10-CM

## 2020-06-16 DIAGNOSIS — D508 Other iron deficiency anemias: Secondary | ICD-10-CM

## 2020-06-16 DIAGNOSIS — D649 Anemia, unspecified: Secondary | ICD-10-CM

## 2020-06-16 DIAGNOSIS — I4891 Unspecified atrial fibrillation: Secondary | ICD-10-CM | POA: Insufficient documentation

## 2020-06-16 DIAGNOSIS — Z7901 Long term (current) use of anticoagulants: Secondary | ICD-10-CM | POA: Diagnosis not present

## 2020-06-16 DIAGNOSIS — N170 Acute kidney failure with tubular necrosis: Secondary | ICD-10-CM | POA: Diagnosis not present

## 2020-06-16 DIAGNOSIS — N289 Disorder of kidney and ureter, unspecified: Secondary | ICD-10-CM | POA: Diagnosis not present

## 2020-06-16 DIAGNOSIS — C772 Secondary and unspecified malignant neoplasm of intra-abdominal lymph nodes: Secondary | ICD-10-CM

## 2020-06-16 DIAGNOSIS — Z79899 Other long term (current) drug therapy: Secondary | ICD-10-CM | POA: Diagnosis not present

## 2020-06-16 DIAGNOSIS — Z5189 Encounter for other specified aftercare: Secondary | ICD-10-CM | POA: Insufficient documentation

## 2020-06-16 DIAGNOSIS — Z95828 Presence of other vascular implants and grafts: Secondary | ICD-10-CM

## 2020-06-16 LAB — CMP (CANCER CENTER ONLY)
ALT: 23 U/L (ref 0–44)
AST: 29 U/L (ref 15–41)
Albumin: 3.8 g/dL (ref 3.5–5.0)
Alkaline Phosphatase: 78 U/L (ref 38–126)
Anion gap: 10 (ref 5–15)
BUN: 64 mg/dL — ABNORMAL HIGH (ref 8–23)
CO2: 21 mmol/L — ABNORMAL LOW (ref 22–32)
Calcium: 8.7 mg/dL — ABNORMAL LOW (ref 8.9–10.3)
Chloride: 101 mmol/L (ref 98–111)
Creatinine: 6.27 mg/dL (ref 0.61–1.24)
GFR, Estimated: 9 mL/min — ABNORMAL LOW (ref >60)
Glucose, Bld: 165 mg/dL — ABNORMAL HIGH (ref 70–99)
Potassium: 4 mmol/L (ref 3.5–5.1)
Sodium: 132 mmol/L — ABNORMAL LOW (ref 135–145)
Total Bilirubin: 0.5 mg/dL (ref 0.3–1.2)
Total Protein: 6.7 g/dL (ref 6.5–8.1)

## 2020-06-16 LAB — CBC WITH DIFFERENTIAL (CANCER CENTER ONLY)
Abs Immature Granulocytes: 0.03 10*3/uL (ref 0.00–0.07)
Basophils Absolute: 0 10*3/uL (ref 0.0–0.1)
Basophils Relative: 0 %
Eosinophils Absolute: 0.2 10*3/uL (ref 0.0–0.5)
Eosinophils Relative: 3 %
HCT: 26.9 % — ABNORMAL LOW (ref 39.0–52.0)
Hemoglobin: 8.7 g/dL — ABNORMAL LOW (ref 13.0–17.0)
Immature Granulocytes: 0 %
Lymphocytes Relative: 14 %
Lymphs Abs: 1.2 10*3/uL (ref 0.7–4.0)
MCH: 32.6 pg (ref 26.0–34.0)
MCHC: 32.3 g/dL (ref 30.0–36.0)
MCV: 100.7 fL — ABNORMAL HIGH (ref 80.0–100.0)
Monocytes Absolute: 0.9 10*3/uL (ref 0.1–1.0)
Monocytes Relative: 11 %
Neutro Abs: 5.8 10*3/uL (ref 1.7–7.7)
Neutrophils Relative %: 72 %
Platelet Count: 126 10*3/uL — ABNORMAL LOW (ref 150–400)
RBC: 2.67 MIL/uL — ABNORMAL LOW (ref 4.22–5.81)
RDW: 13.7 % (ref 11.5–15.5)
WBC Count: 8.2 10*3/uL (ref 4.0–10.5)
nRBC: 0 % (ref 0.0–0.2)

## 2020-06-16 LAB — MAGNESIUM: Magnesium: 1.8 mg/dL (ref 1.7–2.4)

## 2020-06-16 LAB — SAMPLE TO BLOOD BANK

## 2020-06-16 LAB — PREPARE RBC (CROSSMATCH)

## 2020-06-16 LAB — PHOSPHORUS: Phosphorus: 4.1 mg/dL (ref 2.5–4.6)

## 2020-06-16 MED ORDER — DARBEPOETIN ALFA 300 MCG/0.6ML IJ SOSY
PREFILLED_SYRINGE | INTRAMUSCULAR | Status: AC
  Filled 2020-06-16: qty 0.6

## 2020-06-16 MED ORDER — SODIUM CHLORIDE 0.9% FLUSH
3.0000 mL | INTRAVENOUS | Status: AC | PRN
  Administered 2020-06-16: 3 mL
  Filled 2020-06-16: qty 10

## 2020-06-16 MED ORDER — HEPARIN SOD (PORK) LOCK FLUSH 100 UNIT/ML IV SOLN
250.0000 [IU] | INTRAVENOUS | Status: DC | PRN
  Filled 2020-06-16: qty 5

## 2020-06-16 MED ORDER — DIPHENHYDRAMINE HCL 25 MG PO CAPS
25.0000 mg | ORAL_CAPSULE | Freq: Once | ORAL | Status: AC
  Administered 2020-06-16: 25 mg via ORAL

## 2020-06-16 MED ORDER — ACETAMINOPHEN 325 MG PO TABS
650.0000 mg | ORAL_TABLET | Freq: Once | ORAL | Status: AC
  Administered 2020-06-16: 650 mg via ORAL

## 2020-06-16 MED ORDER — HEPARIN SOD (PORK) LOCK FLUSH 100 UNIT/ML IV SOLN
500.0000 [IU] | Freq: Every day | INTRAVENOUS | Status: AC | PRN
  Administered 2020-06-16: 500 [IU]
  Filled 2020-06-16: qty 5

## 2020-06-16 MED ORDER — ACETAMINOPHEN 325 MG PO TABS
ORAL_TABLET | ORAL | Status: AC
  Filled 2020-06-16: qty 2

## 2020-06-16 MED ORDER — DIPHENHYDRAMINE HCL 25 MG PO CAPS
ORAL_CAPSULE | ORAL | Status: AC
  Filled 2020-06-16: qty 2

## 2020-06-16 MED ORDER — SODIUM CHLORIDE 0.9% IV SOLUTION
250.0000 mL | Freq: Once | INTRAVENOUS | Status: AC
  Administered 2020-06-16: 250 mL via INTRAVENOUS
  Filled 2020-06-16: qty 250

## 2020-06-16 MED ORDER — DARBEPOETIN ALFA 300 MCG/0.6ML IJ SOSY
300.0000 ug | PREFILLED_SYRINGE | Freq: Once | INTRAMUSCULAR | Status: AC
  Administered 2020-06-16: 300 ug via SUBCUTANEOUS

## 2020-06-16 NOTE — Progress Notes (Signed)
06/16/2020 Unable to relay meds.

## 2020-06-16 NOTE — Progress Notes (Signed)
06/16/2020 Spoke with wife via telephone to reconcile meds.

## 2020-06-16 NOTE — Patient Instructions (Signed)
Implanted Port Insertion, Care After This sheet gives you information about how to care for yourself after your procedure. Your health care provider may also give you more specific instructions. If you have problems or questions, contact your health care provider. What can I expect after the procedure? After the procedure, it is common to have:  Discomfort at the port insertion site.  Bruising on the skin over the port. This should improve over 3-4 days. Follow these instructions at home: Port care  After your port is placed, you will get a manufacturer's information card. The card has information about your port. Keep this card with you at all times.  Take care of the port as told by your health care provider. Ask your health care provider if you or a family member can get training for taking care of the port at home. A home health care nurse may also take care of the port.  Make sure to remember what type of port you have. Incision care  Follow instructions from your health care provider about how to take care of your port insertion site. Make sure you: ? Wash your hands with soap and water before and after you change your bandage (dressing). If soap and water are not available, use hand sanitizer. ? Change your dressing as told by your health care provider. ? Leave stitches (sutures), skin glue, or adhesive strips in place. These skin closures may need to stay in place for 2 weeks or longer. If adhesive strip edges start to loosen and curl up, you may trim the loose edges. Do not remove adhesive strips completely unless your health care provider tells you to do that.  Check your port insertion site every day for signs of infection. Check for: ? Redness, swelling, or pain. ? Fluid or blood. ? Warmth. ? Pus or a bad smell.      Activity  Return to your normal activities as told by your health care provider. Ask your health care provider what activities are safe for you.  Do not  lift anything that is heavier than 10 lb (4.5 kg), or the limit that you are told, until your health care provider says that it is safe. General instructions  Take over-the-counter and prescription medicines only as told by your health care provider.  Do not take baths, swim, or use a hot tub until your health care provider approves. Ask your health care provider if you may take showers. You may only be allowed to take sponge baths.  Do not drive for 24 hours if you were given a sedative during your procedure.  Wear a medical alert bracelet in case of an emergency. This will tell any health care providers that you have a port.  Keep all follow-up visits as told by your health care provider. This is important. Contact a health care provider if:  You cannot flush your port with saline as directed, or you cannot draw blood from the port.  You have a fever or chills.  You have redness, swelling, or pain around your port insertion site.  You have fluid or blood coming from your port insertion site.  Your port insertion site feels warm to the touch.  You have pus or a bad smell coming from the port insertion site. Get help right away if:  You have chest pain or shortness of breath.  You have bleeding from your port that you cannot control. Summary  Take care of the port as told by your   health care provider. Keep the manufacturer's information card with you at all times.  Change your dressing as told by your health care provider.  Contact a health care provider if you have a fever or chills or if you have redness, swelling, or pain around your port insertion site.  Keep all follow-up visits as told by your health care provider. This information is not intended to replace advice given to you by your health care provider. Make sure you discuss any questions you have with your health care provider. Document Revised: 10/25/2017 Document Reviewed: 10/25/2017 Elsevier Patient Education   2021 Elsevier Inc.  

## 2020-06-16 NOTE — Progress Notes (Signed)
Hematology and Oncology Follow Up Visit  [name] Rose [id number] [birth year] 70 y.o. 06/16/2020   Principle Diagnosis:  Metastatic high-grade bladder cancer - recurrent Anemia of renal insufficiency Diverticular abscess - E.coli  Past Therapy: Atezolizumab 1200mg  IV q 3 wks - s/p cycle #4 - d/c due to progression. Taxotere 80mg /m2 IV q 3 wks - s/p cycle #3 - d/c due to progression M-VAC - s/p cycle#8 -- d/c on 07/10/2018 due to blood counts Padcev -- s/p cycle #1 on 04/06/2020d/c 08/28/2018  Current Therapy: Trodelvy8 mg/kg IV d1&8 -- s/p cycle #3  -- re- start on 06/23/2020 Aranesp 300 mcg sq q 3 week for Hgb < 11 Ifosfamide/Gemzar -- s/p cycle #9- started 04/16/2019  -- d/c on 12/21/2019 due to progression IV iron as indicated   Interim History:  [name] Rose is here today for treatment.  Unfortunately, his doing well at all.  He comes in a wheelchair.  He has worsening renal failure.  His BUN is 64 and creatinine is 6.27.  I just worry that his renal function is worsening.  His hemoglobin is 8.7.  I really think he needs to be transfused.  I know he will get Aranesp today.  He was set up for [employer].  However, I do still think that we should give this to him today.  There has been no nausea or vomiting.  He just feels tired.  His atrial fibrillation seems to be under decent control.  His blood pressure is on the lower side.  He really is not on anything for blood pressure so that we not to worry about.  I would have to call his nephrologist.  Every time we see him, his renal function is worse.  He tells me that she is considering dialysis for him.  I know this would be a very difficult decision to make.  He has metastatic cancer.  The cancer is not under not good control right now.  However, he does not have much cancer volume.  We will set him up with blood.  We will do this tomorrow.  I think this will help.  Currently, his performance status is by ECOG 3.      Medications:  Allergies as of 06/16/2020   No Known Allergies     Medication List       Accurate as of June 16, 2020  2:02 PM. If you have any questions, ask your nurse or doctor.        acetaminophen 500 MG tablet Commonly known as: TYLENOL Take 1,000 mg by mouth every 6 (six) hours as needed for mild pain or headache.   amiodarone 200 MG tablet Commonly known as: PACERONE Take 1 tablet (200 mg total) by mouth daily.   apixaban 5 MG Tabs tablet Commonly known as: Eliquis Take 1 tablet (5 mg total) by mouth 2 (two) times daily.   atorvastatin 40 MG tablet Commonly known as: LIPITOR Take 40 mg by mouth every evening.   cyclobenzaprine 7.5 MG tablet Commonly known as: FEXMID Take 1 tablet (7.5 mg total) by mouth 3 (three) times daily as needed for muscle spasms.   DULoxetine 60 MG capsule Commonly known as: CYMBALTA TAKE ONE CAPSULE BY MOUTH DAILY   famotidine 20 MG tablet Commonly known as: PEPCID Take 20 mg by mouth 2 (two) times daily.   lidocaine-prilocaine cream Commonly known as: EMLA Apply to affected area once   loperamide 2 MG tablet Commonly known as: IMODIUM A-D Take 2 at diarrhea onset,  then 1 every 2hr until 12hrs with no BM. May take 2 every 4hrs at night. If diarrhea recurs repeat.   LORazepam 0.5 MG tablet Commonly known as: ATIVAN Take 0.5 mg by mouth every 6 (six) hours as needed.   magnesium oxide 400 MG tablet Commonly known as: MAG-OX Take 1 tablet (400 mg total) by mouth 2 (two) times daily.   metoprolol tartrate 25 MG tablet Commonly known as: LOPRESSOR Take 25 mg by mouth 2 (two) times daily.   oxybutynin 10 MG 24 hr tablet Commonly known as: DITROPAN-XL Take 10 mg by mouth at bedtime.   potassium chloride 10 MEQ tablet Commonly known as: KLOR-CON Take 2 tablets (20 mEq total) by mouth daily. What changed: how much to take   prochlorperazine 10 MG tablet Commonly known as: COMPAZINE Take 1 tablet (10 mg total) by mouth  every 6 (six) hours as needed (Nausea or vomiting).   pyridoxine 100 MG tablet Commonly known as: B-6 Take 200 mg by mouth daily with breakfast.   sodium bicarbonate 650 MG tablet Take 2 tablets (1,300 mg total) by mouth 2 (two) times daily.   Vitamin D (Ergocalciferol) 1.25 MG (50000 UNIT) Caps capsule Commonly known as: DRISDOL Take 50,000 Units by mouth once a week.       Allergies: No Known Allergies  Past Medical History, Surgical history, Social history, and Family History were reviewed and updated.  Review of Systems: Review of Systems  Constitutional: Negative.   HENT: Negative.   Eyes: Negative.   Respiratory: Negative.   Cardiovascular: Positive for palpitations.  Gastrointestinal: Positive for nausea.  Genitourinary: Negative.   Musculoskeletal: Positive for myalgias.  Skin: Negative.   Neurological: Positive for tingling.  Endo/Heme/Allergies: Negative.   Psychiatric/Behavioral: Negative.      Physical Exam:  vitals were not taken for this visit.   Wt Readings from Last 3 Encounters:  06/09/20 250 lb 1.9 oz (113.5 kg)  05/28/20 253 lb 6.4 oz (114.9 kg)  04/28/20 254 lb (115.2 kg)    Physical Exam Vitals reviewed.  HENT:     Head: Normocephalic and atraumatic.  Eyes:     Pupils: Pupils are equal, round, and reactive to light.  Cardiovascular:     Rate and Rhythm: Tachycardia present. Rhythm irregular.     Heart sounds: Normal heart sounds.     Comments: Cardiac exam is tachycardic and irregular consistent with the atrial fibrillation.  There are no murmurs. Pulmonary:     Effort: Pulmonary effort is normal.     Breath sounds: Normal breath sounds.  Abdominal:     General: Bowel sounds are normal.     Palpations: Abdomen is soft.  Musculoskeletal:        General: No tenderness or deformity. Normal range of motion.     Cervical back: Normal range of motion.  Lymphadenopathy:     Cervical: No cervical adenopathy.  Skin:    General: Skin is  warm and dry.     Findings: No erythema or rash.  Neurological:     Mental Status: He is alert and oriented to person, place, and time.  Psychiatric:        Behavior: Behavior normal.        Thought Content: Thought content normal.        Judgment: Judgment normal.      Lab Results  Component Value Date   WBC 8.2 06/16/2020   HGB 8.7 (L) 06/16/2020   HCT 26.9 (L) 06/16/2020   MCV  100.7 (H) 06/16/2020   PLT 126 (L) 06/16/2020   Lab Results  Component Value Date   FERRITIN 1,346 (H) 06/09/2020   IRON 87 06/09/2020   TIBC 231 06/09/2020   UIBC 144 06/09/2020   IRONPCTSAT 38 06/09/2020   Lab Results  Component Value Date   RETICCTPCT 2.1 06/09/2020   RBC 2.67 (L) 06/16/2020   No results found for: KPAFRELGTCHN, LAMBDASER, KAPLAMBRATIO No results found for: IGGSERUM, IGA, IGMSERUM No results found for: Kathrynn Ducking, MSPIKE, SPEI   Chemistry      Component Value Date/Time   NA 132 (L) 06/16/2020 0834   NA 140 12/18/2019 1411   NA 141 02/21/2017 0902   K 4.0 06/16/2020 0834   K 4.1 02/21/2017 0902   CL 101 06/16/2020 0834   CL 98 02/21/2017 0902   CO2 21 (L) 06/16/2020 0834   CO2 31 02/21/2017 0902   BUN 64 (H) 06/16/2020 0834   BUN 25 12/18/2019 1411   BUN 17 02/21/2017 0902   CREATININE 6.27 (HH) 06/16/2020 0834   CREATININE 1.2 02/21/2017 0902      Component Value Date/Time   CALCIUM 8.7 (L) 06/16/2020 0834   CALCIUM 9.2 02/21/2017 0902   ALKPHOS 78 06/16/2020 0834   ALKPHOS 65 02/21/2017 0902   AST 29 06/16/2020 0834   ALT 23 06/16/2020 0834   ALT 18 02/21/2017 0902   BILITOT 0.5 06/16/2020 0834       Impression and Plan: [name] Rose is a very pleasant 70yo caucasian gentleman with metastatic high grade bladder cancer. He has been on multiple courses of therapy.  He has other health issues.  He has atrial fibrillation.  He has renal failure.  Again, we will have to hold on his treatment.  We will give  him 1 week.  Maybe, he will feel better next week.  We will check his lab work.  We will have to see what the nephrologist has to say.  We can give code elevated despite renal insufficiency.  We will give him a reduced dose anyway.  I just wish he would feel better.  His quality of life is just not that great right now.  Quality of life really is being dictated by his renal insufficiency.  Hopefully the blood that we will give him will help him feel better.  We will give him a dose of Aranesp today.  Volanda Napoleon, MD 3/7/20222:02 PM

## 2020-06-16 NOTE — Progress Notes (Signed)
No treatment today.1 unit PRBC today

## 2020-06-16 NOTE — Telephone Encounter (Signed)
[name] notified of creat-6.27.  Order received for pt to see [name] today.  Message sent to scheduling.

## 2020-06-16 NOTE — Patient Instructions (Signed)
Blood Transfusion, Adult °A blood transfusion is a procedure in which you receive blood through an IV tube. You may need this procedure because of: °A bleeding disorder. °An illness. °An injury. °A surgery. °The blood may come from someone else (a donor). You may also be able to donate blood for yourself. The blood given in a transfusion is made up of different types of cells. You may get: °Red blood cells. These carry oxygen to the cells in the body. °White blood cells. These help you fight infections. °Platelets. These help your blood to clot. °Plasma. This is the liquid part of your blood. It carries proteins and other substances through the body. °If you have a clotting disorder, you may also get other types of blood products. °Tell your doctor about: °Any blood disorders you have. °Any reactions you have had during a blood transfusion in the past. °Any allergies you have. °All medicines you are taking, including vitamins, herbs, eye drops, creams, and over-the-counter medicines. °Any surgeries you have had. °Any medical conditions you have. This includes any recent fever or cold symptoms. °Whether you are pregnant or may be pregnant. °What are the risks? °Generally, this is a safe procedure. However, problems may occur. °The most common problems include: °A mild allergic reaction. This includes red, swollen areas of skin (hives) and itching. °Fever or chills. This may be the body's response to new blood cells received. This may happen during or up to 4 hours after the transfusion. °More serious problems may include: °Too much fluid in the lungs. This may cause breathing problems. °A serious allergic reaction. This includes breathing trouble or swelling around the face and lips. °Lung injury. This causes breathing trouble and low oxygen in the blood. This can happen within hours of the transfusion or days later. °Too much iron. This can happen after getting many blood transfusions over a period of time. °An  infection or virus passed through the blood. This is rare. Donated blood is carefully tested before it is given. °Your body's defense system (immune system) trying to attack the new blood cells. This is rare. Symptoms may include fever, chills, nausea, low blood pressure, and low back or chest pain. °Donated cells attacking healthy tissues. This is rare. °What happens before the procedure? °Medicines °Ask your doctor about: °Changing or stopping your normal medicines. This is important. °Taking aspirin and ibuprofen. Do not take these medicines unless your doctor tells you to take them. °Taking over-the-counter medicines, vitamins, herbs, and supplements. °General instructions °Follow instructions from your doctor about what you cannot eat or drink. °You will have a blood test to find out your blood type. The test also finds out what type of blood your body will accept and matches it to the donor type. °If you are going to have a planned surgery, you may be able to donate your own blood. This may be done in case you need a transfusion. °You will have your temperature, blood pressure, and pulse checked. °You may receive medicine to help prevent an allergic reaction. This may be done if you have had a reaction to a transfusion before. This medicine may be given to you by mouth or through an IV tube. °This procedure lasts about 1-4 hours. Plan for the time you need. °What happens during the procedure? °An IV tube will be put into one of your veins. °The bag of donated blood will be attached to your IV tube. Then, the blood will enter through your vein. °Your temperature, blood   pressure, and pulse will be checked often. This is done to find early signs of a transfusion reaction. °Tell your nurse right away if you have any of these symptoms: °Shortness of breath or trouble breathing. °Chest or back pain. °Fever or chills. °Red, swollen areas of skin or itching. °If you have any signs or symptoms of a reaction, your  transfusion will be stopped. You may also be given medicine. °When the transfusion is finished, your IV tube will be taken out. °Pressure may be put on the IV site for a few minutes. °A bandage (dressing) will be put on the IV site. °The procedure may vary among doctors and hospitals.   °What happens after the procedure? °You will be monitored until you leave the hospital or clinic. This includes checking your temperature, blood pressure, pulse, breathing rate, and blood oxygen level. °Your blood may be tested to see how you are responding to the transfusion. °You may be warmed with fluids or blankets. This is done to keep the temperature of your body normal. °If you have your procedure in an outpatient setting, you will be told whom to contact to report any reactions. °Where to find more information °To learn more, visit the [employer]: redcross.org °Summary °A blood transfusion is a procedure in which you are given blood through an IV tube. °The blood may come from someone else (a donor). You may also be able to donate blood for yourself. °The blood you are given is made up of different blood cells. You may receive red blood cells, platelets, plasma, or white blood cells. °Your temperature, blood pressure, and pulse will be checked often. °After the procedure, your blood may be tested to see how you are responding. °This information is not intended to replace advice given to you by your health care provider. Make sure you discuss any questions you have with your health care provider. °Document Revised: 09/21/2018 Document Reviewed: 09/21/2018 °Elsevier Patient Education © 2021 [employer] ° °

## 2020-06-17 ENCOUNTER — Telehealth: Payer: Self-pay | Admitting: Hematology & Oncology

## 2020-06-17 DIAGNOSIS — R2689 Other abnormalities of gait and mobility: Secondary | ICD-10-CM | POA: Diagnosis not present

## 2020-06-17 DIAGNOSIS — Z7409 Other reduced mobility: Secondary | ICD-10-CM | POA: Diagnosis not present

## 2020-06-17 DIAGNOSIS — R531 Weakness: Secondary | ICD-10-CM | POA: Diagnosis not present

## 2020-06-17 DIAGNOSIS — R5381 Other malaise: Secondary | ICD-10-CM | POA: Diagnosis not present

## 2020-06-17 DIAGNOSIS — G629 Polyneuropathy, unspecified: Secondary | ICD-10-CM | POA: Diagnosis not present

## 2020-06-17 LAB — BPAM RBC
Blood Product Expiration Date: 202203282359
ISSUE DATE / TIME: 202203071135
Unit Type and Rh: 6200

## 2020-06-17 LAB — TYPE AND SCREEN
ABO/RH(D): A POS
Antibody Screen: NEGATIVE
Unit division: 0

## 2020-06-17 NOTE — Telephone Encounter (Signed)
Appointments scheduled per 3/7 los letter/calendar mailed

## 2020-06-18 ENCOUNTER — Other Ambulatory Visit: Payer: Self-pay | Admitting: Hematology & Oncology

## 2020-06-19 DIAGNOSIS — R2689 Other abnormalities of gait and mobility: Secondary | ICD-10-CM | POA: Diagnosis not present

## 2020-06-19 DIAGNOSIS — R5381 Other malaise: Secondary | ICD-10-CM | POA: Diagnosis not present

## 2020-06-19 DIAGNOSIS — G629 Polyneuropathy, unspecified: Secondary | ICD-10-CM | POA: Diagnosis not present

## 2020-06-19 DIAGNOSIS — R531 Weakness: Secondary | ICD-10-CM | POA: Diagnosis not present

## 2020-06-19 DIAGNOSIS — Z7409 Other reduced mobility: Secondary | ICD-10-CM | POA: Diagnosis not present

## 2020-06-23 ENCOUNTER — Other Ambulatory Visit: Payer: Self-pay

## 2020-06-23 ENCOUNTER — Inpatient Hospital Stay: Payer: PPO

## 2020-06-23 ENCOUNTER — Inpatient Hospital Stay (HOSPITAL_BASED_OUTPATIENT_CLINIC_OR_DEPARTMENT_OTHER): Payer: PPO | Admitting: Family

## 2020-06-23 VITALS — BP 155/97 | HR 91 | Temp 97.0°F | Resp 17 | Wt 256.1 lb

## 2020-06-23 DIAGNOSIS — N189 Chronic kidney disease, unspecified: Secondary | ICD-10-CM

## 2020-06-23 DIAGNOSIS — D631 Anemia in chronic kidney disease: Secondary | ICD-10-CM

## 2020-06-23 DIAGNOSIS — N183 Chronic kidney disease, stage 3 unspecified: Secondary | ICD-10-CM

## 2020-06-23 DIAGNOSIS — D649 Anemia, unspecified: Secondary | ICD-10-CM | POA: Diagnosis not present

## 2020-06-23 DIAGNOSIS — N170 Acute kidney failure with tubular necrosis: Secondary | ICD-10-CM

## 2020-06-23 DIAGNOSIS — C679 Malignant neoplasm of bladder, unspecified: Secondary | ICD-10-CM | POA: Diagnosis not present

## 2020-06-23 DIAGNOSIS — Z5112 Encounter for antineoplastic immunotherapy: Secondary | ICD-10-CM | POA: Diagnosis not present

## 2020-06-23 DIAGNOSIS — C772 Secondary and unspecified malignant neoplasm of intra-abdominal lymph nodes: Secondary | ICD-10-CM

## 2020-06-23 DIAGNOSIS — D508 Other iron deficiency anemias: Secondary | ICD-10-CM

## 2020-06-23 LAB — CBC WITH DIFFERENTIAL (CANCER CENTER ONLY)
Abs Immature Granulocytes: 0.02 10*3/uL (ref 0.00–0.07)
Basophils Absolute: 0 10*3/uL (ref 0.0–0.1)
Basophils Relative: 0 %
Eosinophils Absolute: 0.2 10*3/uL (ref 0.0–0.5)
Eosinophils Relative: 2 %
HCT: 31.1 % — ABNORMAL LOW (ref 39.0–52.0)
Hemoglobin: 10.2 g/dL — ABNORMAL LOW (ref 13.0–17.0)
Immature Granulocytes: 0 %
Lymphocytes Relative: 11 %
Lymphs Abs: 0.8 10*3/uL (ref 0.7–4.0)
MCH: 32.8 pg (ref 26.0–34.0)
MCHC: 32.8 g/dL (ref 30.0–36.0)
MCV: 100 fL (ref 80.0–100.0)
Monocytes Absolute: 0.8 10*3/uL (ref 0.1–1.0)
Monocytes Relative: 11 %
Neutro Abs: 5.9 10*3/uL (ref 1.7–7.7)
Neutrophils Relative %: 76 %
Platelet Count: 115 10*3/uL — ABNORMAL LOW (ref 150–400)
RBC: 3.11 MIL/uL — ABNORMAL LOW (ref 4.22–5.81)
RDW: 14.6 % (ref 11.5–15.5)
WBC Count: 7.8 10*3/uL (ref 4.0–10.5)
nRBC: 0 % (ref 0.0–0.2)

## 2020-06-23 LAB — CMP (CANCER CENTER ONLY)
ALT: 23 U/L (ref 0–44)
AST: 29 U/L (ref 15–41)
Albumin: 3.9 g/dL (ref 3.5–5.0)
Alkaline Phosphatase: 76 U/L (ref 38–126)
Anion gap: 10 (ref 5–15)
BUN: 62 mg/dL — ABNORMAL HIGH (ref 8–23)
CO2: 21 mmol/L — ABNORMAL LOW (ref 22–32)
Calcium: 9.1 mg/dL (ref 8.9–10.3)
Chloride: 101 mmol/L (ref 98–111)
Creatinine: 6.42 mg/dL (ref 0.61–1.24)
GFR, Estimated: 9 mL/min — ABNORMAL LOW (ref >60)
Glucose, Bld: 152 mg/dL — ABNORMAL HIGH (ref 70–99)
Potassium: 4.2 mmol/L (ref 3.5–5.1)
Sodium: 132 mmol/L — ABNORMAL LOW (ref 135–145)
Total Bilirubin: 0.6 mg/dL (ref 0.3–1.2)
Total Protein: 6.7 g/dL (ref 6.5–8.1)

## 2020-06-23 LAB — SAMPLE TO BLOOD BANK

## 2020-06-23 LAB — LACTATE DEHYDROGENASE: LDH: 243 U/L — ABNORMAL HIGH (ref 98–192)

## 2020-06-23 LAB — MAGNESIUM: Magnesium: 1.7 mg/dL (ref 1.7–2.4)

## 2020-06-23 MED ORDER — PALONOSETRON HCL INJECTION 0.25 MG/5ML
INTRAVENOUS | Status: AC
  Filled 2020-06-23: qty 5

## 2020-06-23 MED ORDER — DIPHENHYDRAMINE HCL 50 MG/ML IJ SOLN
INTRAMUSCULAR | Status: AC
  Filled 2020-06-23: qty 1

## 2020-06-23 MED ORDER — FAMOTIDINE IN NACL 20-0.9 MG/50ML-% IV SOLN
20.0000 mg | Freq: Once | INTRAVENOUS | Status: AC
  Administered 2020-06-23: 20 mg via INTRAVENOUS

## 2020-06-23 MED ORDER — SODIUM CHLORIDE 0.9% FLUSH
10.0000 mL | INTRAVENOUS | Status: DC | PRN
  Administered 2020-06-23: 10 mL
  Filled 2020-06-23: qty 10

## 2020-06-23 MED ORDER — ACETAMINOPHEN 325 MG PO TABS
ORAL_TABLET | ORAL | Status: AC
  Filled 2020-06-23: qty 2

## 2020-06-23 MED ORDER — PALONOSETRON HCL INJECTION 0.25 MG/5ML
0.2500 mg | Freq: Once | INTRAVENOUS | Status: AC
  Administered 2020-06-23: 0.25 mg via INTRAVENOUS

## 2020-06-23 MED ORDER — SODIUM CHLORIDE 0.9 % IV SOLN
150.0000 mg | Freq: Once | INTRAVENOUS | Status: AC
  Administered 2020-06-23: 150 mg via INTRAVENOUS
  Filled 2020-06-23: qty 150

## 2020-06-23 MED ORDER — DIPHENHYDRAMINE HCL 50 MG/ML IJ SOLN
50.0000 mg | Freq: Once | INTRAMUSCULAR | Status: AC
  Administered 2020-06-23: 50 mg via INTRAVENOUS

## 2020-06-23 MED ORDER — HEPARIN SOD (PORK) LOCK FLUSH 100 UNIT/ML IV SOLN
500.0000 [IU] | Freq: Once | INTRAVENOUS | Status: AC | PRN
  Administered 2020-06-23: 500 [IU]
  Filled 2020-06-23: qty 5

## 2020-06-23 MED ORDER — FAMOTIDINE IN NACL 20-0.9 MG/50ML-% IV SOLN
INTRAVENOUS | Status: AC
  Filled 2020-06-23: qty 50

## 2020-06-23 MED ORDER — ATROPINE SULFATE 1 MG/ML IJ SOLN: 0.5000 mg | Freq: Once | INTRAMUSCULAR | Status: DC | PRN

## 2020-06-23 MED ORDER — ACETAMINOPHEN 325 MG PO TABS
650.0000 mg | ORAL_TABLET | Freq: Once | ORAL | Status: AC
  Administered 2020-06-23: 650 mg via ORAL

## 2020-06-23 MED ORDER — SODIUM CHLORIDE 0.9 % IV SOLN
5.7600 mg/kg | Freq: Once | INTRAVENOUS | Status: AC
  Administered 2020-06-23: 720 mg via INTRAVENOUS
  Filled 2020-06-23: qty 72

## 2020-06-23 MED ORDER — SODIUM CHLORIDE 0.9 % IV SOLN
10.0000 mg | Freq: Once | INTRAVENOUS | Status: AC
  Administered 2020-06-23: 10 mg via INTRAVENOUS
  Filled 2020-06-23: qty 10

## 2020-06-23 MED ORDER — SODIUM CHLORIDE 0.9 % IV SOLN
Freq: Once | INTRAVENOUS | Status: AC
Start: 2020-06-23 — End: 2020-06-23
  Filled 2020-06-23: qty 250

## 2020-06-23 MED ORDER — DIPHENHYDRAMINE HCL 25 MG PO CAPS
ORAL_CAPSULE | ORAL | Status: AC
  Filled 2020-06-23: qty 2

## 2020-06-23 NOTE — Progress Notes (Signed)
Hematology and Oncology Follow Up Visit  [name] Rose [id number] [birth year] 70 y.o. 06/23/2020   Principle Diagnosis:  Metastatic high-grade bladder cancer - recurrent Anemia of renal insufficiency Diverticular abscess - E.coli  Past Therapy: Atezolizumab 1200mg  IV q 3 wks - s/p cycle #4 - d/c due to progression. Taxotere 80mg /m2 IV q 3 wks - s/p cycle #3 - d/c due to progression M-VAC - s/p cycle#8 -- d/c on 07/10/2018 due to blood counts Padcev -- s/p cycle #1 on 04/06/2020d/c 08/28/2018 Ifosfamide/Gemzar -- s/p cycle #9- started 04/16/2019  -- d/c on 12/21/2019 due to progression  Current Therapy: Trodelvy8 mg/kg IV d1&8 -- s/p 3 cycles (Sepetmber-October 2021)  -- re-started on 06/23/2020 at reduced dose Aranesp 300 mcg sq q 3 week for Hgb < 11 IV iron as indicated   Interim History:  [name] Rose is here today for follow-up and to start treatment again with [place]. He notes that his energy seems a little better this week.  He is in PT 2 days a week and feels that this has helped with strength. He has not fallen since November 2021. No syncope He notes occasional morning nausea and sometimes vomiting in small amounts.  He has mild SOB with exertion and fatigue.  He has not noted any blood loss. No bruising or petechiae.  No fever, chills, cough, rash, chest pain, palpitations, abdominal pain or changes in bladder habits.  He has had some recent constipation and plans to try an over the counter stool softener prior to prescription strength.  No swelling in his extremities.  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 The neuropathy in his hands is not as severe.   ECOG Performance Status: 1 - Symptomatic but completely ambulatory  Medications:  Allergies as of 06/23/2020   No Known Allergies     Medication List       Accurate as of June 23, 2020 12:06 PM. If you have any questions, ask your nurse or doctor.        acetaminophen 500 MG  tablet Commonly known as: TYLENOL Take 1,000 mg by mouth every 6 (six) hours as needed for mild pain or headache.   amiodarone 200 MG tablet Commonly known as: PACERONE Take 1 tablet (200 mg total) by mouth daily.   apixaban 5 MG Tabs tablet Commonly known as: Eliquis Take 1 tablet (5 mg total) by mouth 2 (two) times daily.   atorvastatin 40 MG tablet Commonly known as: LIPITOR Take 40 mg by mouth every evening.   cyclobenzaprine 7.5 MG tablet Commonly known as: FEXMID Take 1 tablet (7.5 mg total) by mouth 3 (three) times daily as needed for muscle spasms.   DULoxetine 60 MG capsule Commonly known as: CYMBALTA TAKE ONE CAPSULE BY MOUTH DAILY   famotidine 20 MG tablet Commonly known as: PEPCID Take 20 mg by mouth 2 (two) times daily.   lidocaine-prilocaine cream Commonly known as: EMLA Apply to affected area once   loperamide 2 MG tablet Commonly known as: IMODIUM A-D Take 2 at diarrhea onset, then 1 every 2hr until 12hrs with no BM. May take 2 every 4hrs at night. If diarrhea recurs repeat.   LORazepam 0.5 MG tablet Commonly known as: ATIVAN Take 0.5 mg by mouth every 6 (six) hours as needed.   magnesium oxide 400 MG tablet Commonly known as: MAG-OX Take 1 tablet (400 mg total) by mouth 2 (two) times daily.   metoprolol tartrate 25 MG tablet Commonly known as: LOPRESSOR Take 25  mg by mouth 2 (two) times daily.   oxybutynin 10 MG 24 hr tablet Commonly known as: DITROPAN-XL Take 10 mg by mouth at bedtime.   potassium chloride 10 MEQ tablet Commonly known as: KLOR-CON Take 2 tablets (20 mEq total) by mouth daily. What changed:   how much to take  when to take this   prochlorperazine 10 MG tablet Commonly known as: COMPAZINE Take 1 tablet (10 mg total) by mouth every 6 (six) hours as needed (Nausea or vomiting).   pyridoxine 100 MG tablet Commonly known as: B-6 Take 200 mg by mouth daily with breakfast.   sodium bicarbonate 650 MG tablet Take 2  tablets (1,300 mg total) by mouth 2 (two) times daily.   Vitamin D (Ergocalciferol) 1.25 MG (50000 UNIT) Caps capsule Commonly known as: DRISDOL Take 50,000 Units by mouth once a week.       Allergies: No Known Allergies  Past Medical History, Surgical history, Social history, and Family History were reviewed and updated.  Review of Systems: All other 10 point review of systems is negative.   Physical Exam:  weight is 256 lb 1.9 oz (116.2 kg). His oral temperature is 97 F (36.1 C) (abnormal). His blood pressure is 155/97 (abnormal) and his pulse is 91. His respiration is 17 and oxygen saturation is 100%.   Wt Readings from Last 3 Encounters:  06/23/20 256 lb 1.9 oz (116.2 kg)  06/09/20 250 lb 1.9 oz (113.5 kg)  05/28/20 253 lb 6.4 oz (114.9 kg)    Ocular: Sclerae unicteric, pupils equal, round and reactive to light Ear-nose-throat: Oropharynx clear, dentition fair Lymphatic: No cervical, supraclavicular or axillary adenopathy Lungs no rales or rhonchi, good excursion bilaterally Heart regular rate and rhythm, no murmur appreciated Abd soft, nontender, positive bowel sounds, no liver or spleen tip palpated on exam, no fluid wave  MSK no focal spinal tenderness, no joint edema Neuro: non-focal, well-oriented, appropriate affect Breasts: Deferred   Lab Results  Component Value Date   WBC 7.8 06/23/2020   HGB 10.2 (L) 06/23/2020   HCT 31.1 (L) 06/23/2020   MCV 100.0 06/23/2020   PLT 115 (L) 06/23/2020   Lab Results  Component Value Date   FERRITIN 1,346 (H) 06/09/2020   IRON 87 06/09/2020   TIBC 231 06/09/2020   UIBC 144 06/09/2020   IRONPCTSAT 38 06/09/2020   Lab Results  Component Value Date   RETICCTPCT 2.1 06/09/2020   RBC 3.11 (L) 06/23/2020   No results found for: KPAFRELGTCHN, LAMBDASER, KAPLAMBRATIO No results found for: IGGSERUM, IGA, IGMSERUM No results found for: Kathrynn Ducking, MSPIKE, SPEI   Chemistry       Component Value Date/Time   NA 132 (L) 06/23/2020 1101   NA 140 12/18/2019 1411   NA 141 02/21/2017 0902   K 4.2 06/23/2020 1101   K 4.1 02/21/2017 0902   CL 101 06/23/2020 1101   CL 98 02/21/2017 0902   CO2 21 (L) 06/23/2020 1101   CO2 31 02/21/2017 0902   BUN 62 (H) 06/23/2020 1101   BUN 25 12/18/2019 1411   BUN 17 02/21/2017 0902   CREATININE 6.42 (HH) 06/23/2020 1101   CREATININE 1.2 02/21/2017 0902      Component Value Date/Time   CALCIUM 9.1 06/23/2020 1101   CALCIUM 9.2 02/21/2017 0902   ALKPHOS 76 06/23/2020 1101   ALKPHOS 65 02/21/2017 0902   AST 29 06/23/2020 1101   ALT 23 06/23/2020 1101   ALT 18 02/21/2017  1610   BILITOT 0.6 06/23/2020 1101       Impression and Plan:  Mr. Chichester is a very pleasant 70yo caucasian gentleman with metastatic high grade bladder cancer and has been on multiple courses of therapy.   We will be following up with her nephrologist later this week on Thursday. I gave him a copy of today's lab work to take with him to his appointment. Creatinine still quite elevated at 6,42 and BUN 62. We will now restart him on Trodelvy. We help this last week due to anemia and was transfused.  He is feeling much better this week and we will proceed with treatment today per MD at a reduced dose. He will also get his Neulasta On Pro with day 8.   Follow-up in 4 weeks.  He was encouraged to contact our office with any questions or concerns.   Laverna Peace, NP 3/14/202212:06 PM

## 2020-06-23 NOTE — Progress Notes (Signed)
[name] Rose from lab brought a panic lab value to me. Creatinine is 6.42. Results given to MD.

## 2020-06-23 NOTE — Progress Notes (Signed)
[name] has reviewed today's labs. Okay to treat with SCr 6.42.

## 2020-06-23 NOTE — Patient Instructions (Signed)

## 2020-06-23 NOTE — Patient Instructions (Signed)
[facility] Discharge Instructions for Patients Receiving Chemotherapy  Today you received the following chemotherapy agents Ivette Loyal   To help prevent nausea and vomiting after your treatment, we encourage you to take your nausea medication as prescribed by MD. **DO NOT TAKE ZOFRAN FOR 3 DAYS AFTER CHEMOTHERAPY**   If you develop nausea and vomiting that is not controlled by your nausea medication, call the clinic.   BELOW ARE SYMPTOMS THAT SHOULD BE REPORTED IMMEDIATELY:  *FEVER GREATER THAN 100.5 F  *CHILLS WITH OR WITHOUT FEVER  NAUSEA AND VOMITING THAT IS NOT CONTROLLED WITH YOUR NAUSEA MEDICATION  *UNUSUAL SHORTNESS OF BREATH  *UNUSUAL BRUISING OR BLEEDING  TENDERNESS IN MOUTH AND THROAT WITH OR WITHOUT PRESENCE OF ULCERS  *URINARY PROBLEMS  *BOWEL PROBLEMS  UNUSUAL RASH Items with * indicate a potential emergency and should be followed up as soon as possible.  Feel free to call the clinic should you have any questions or concerns. The clinic phone number is [phone].  Please show the [facility] at check-in to the Emergency Department and triage nurse.

## 2020-06-23 NOTE — Progress Notes (Signed)
Reviewed labs with [name] and pt ok to treat with creatinine 6.42

## 2020-06-24 ENCOUNTER — Telehealth: Payer: Self-pay

## 2020-06-24 ENCOUNTER — Ambulatory Visit: Payer: PPO

## 2020-06-24 DIAGNOSIS — Z7409 Other reduced mobility: Secondary | ICD-10-CM | POA: Diagnosis not present

## 2020-06-24 DIAGNOSIS — G629 Polyneuropathy, unspecified: Secondary | ICD-10-CM | POA: Diagnosis not present

## 2020-06-24 DIAGNOSIS — R531 Weakness: Secondary | ICD-10-CM | POA: Diagnosis not present

## 2020-06-24 DIAGNOSIS — R5381 Other malaise: Secondary | ICD-10-CM | POA: Diagnosis not present

## 2020-06-24 DIAGNOSIS — R2689 Other abnormalities of gait and mobility: Secondary | ICD-10-CM | POA: Diagnosis not present

## 2020-06-24 NOTE — Telephone Encounter (Signed)
appts adjusted per 06/23/20 sch message per sarahpt will get a new sch at 3.15.22 tx appt  (Hey gals, just a heads up! I changed Uday's follow-up to in 4 weeks instead of 3 after talking to Dr. Johnette Abraham.)  [name] Rose

## 2020-06-26 DIAGNOSIS — C679 Malignant neoplasm of bladder, unspecified: Secondary | ICD-10-CM | POA: Diagnosis not present

## 2020-06-26 DIAGNOSIS — N185 Chronic kidney disease, stage 5: Secondary | ICD-10-CM | POA: Diagnosis not present

## 2020-06-26 DIAGNOSIS — N2581 Secondary hyperparathyroidism of renal origin: Secondary | ICD-10-CM | POA: Diagnosis not present

## 2020-06-26 DIAGNOSIS — K922 Gastrointestinal hemorrhage, unspecified: Secondary | ICD-10-CM | POA: Diagnosis not present

## 2020-06-26 DIAGNOSIS — Z7409 Other reduced mobility: Secondary | ICD-10-CM | POA: Diagnosis not present

## 2020-06-26 DIAGNOSIS — I480 Paroxysmal atrial fibrillation: Secondary | ICD-10-CM | POA: Diagnosis not present

## 2020-06-26 DIAGNOSIS — R5381 Other malaise: Secondary | ICD-10-CM | POA: Diagnosis not present

## 2020-06-26 DIAGNOSIS — E876 Hypokalemia: Secondary | ICD-10-CM | POA: Diagnosis not present

## 2020-06-26 DIAGNOSIS — R2689 Other abnormalities of gait and mobility: Secondary | ICD-10-CM | POA: Diagnosis not present

## 2020-06-26 DIAGNOSIS — I120 Hypertensive chronic kidney disease with stage 5 chronic kidney disease or end stage renal disease: Secondary | ICD-10-CM | POA: Diagnosis not present

## 2020-06-26 DIAGNOSIS — R531 Weakness: Secondary | ICD-10-CM | POA: Diagnosis not present

## 2020-06-26 DIAGNOSIS — G629 Polyneuropathy, unspecified: Secondary | ICD-10-CM | POA: Diagnosis not present

## 2020-06-30 ENCOUNTER — Other Ambulatory Visit: Payer: Self-pay

## 2020-06-30 ENCOUNTER — Inpatient Hospital Stay: Payer: PPO

## 2020-06-30 ENCOUNTER — Telehealth: Payer: Self-pay | Admitting: *Deleted

## 2020-06-30 DIAGNOSIS — I120 Hypertensive chronic kidney disease with stage 5 chronic kidney disease or end stage renal disease: Secondary | ICD-10-CM | POA: Diagnosis present

## 2020-06-30 DIAGNOSIS — L03112 Cellulitis of left axilla: Secondary | ICD-10-CM | POA: Diagnosis present

## 2020-06-30 DIAGNOSIS — C772 Secondary and unspecified malignant neoplasm of intra-abdominal lymph nodes: Secondary | ICD-10-CM | POA: Diagnosis present

## 2020-06-30 DIAGNOSIS — D709 Neutropenia, unspecified: Secondary | ICD-10-CM | POA: Diagnosis not present

## 2020-06-30 DIAGNOSIS — L02412 Cutaneous abscess of left axilla: Secondary | ICD-10-CM | POA: Diagnosis not present

## 2020-06-30 DIAGNOSIS — D638 Anemia in other chronic diseases classified elsewhere: Secondary | ICD-10-CM | POA: Diagnosis not present

## 2020-06-30 DIAGNOSIS — I10 Essential (primary) hypertension: Secondary | ICD-10-CM | POA: Diagnosis not present

## 2020-06-30 DIAGNOSIS — D701 Agranulocytosis secondary to cancer chemotherapy: Secondary | ICD-10-CM | POA: Diagnosis present

## 2020-06-30 DIAGNOSIS — R600 Localized edema: Secondary | ICD-10-CM | POA: Diagnosis not present

## 2020-06-30 DIAGNOSIS — R2689 Other abnormalities of gait and mobility: Secondary | ICD-10-CM | POA: Diagnosis not present

## 2020-06-30 DIAGNOSIS — I4891 Unspecified atrial fibrillation: Secondary | ICD-10-CM | POA: Diagnosis not present

## 2020-06-30 DIAGNOSIS — D703 Neutropenia due to infection: Secondary | ICD-10-CM | POA: Diagnosis not present

## 2020-06-30 DIAGNOSIS — R279 Unspecified lack of coordination: Secondary | ICD-10-CM | POA: Diagnosis not present

## 2020-06-30 DIAGNOSIS — Y848 Other medical procedures as the cause of abnormal reaction of the patient, or of later complication, without mention of misadventure at the time of the procedure: Secondary | ICD-10-CM | POA: Diagnosis present

## 2020-06-30 DIAGNOSIS — T80211A Bloodstream infection due to central venous catheter, initial encounter: Secondary | ICD-10-CM | POA: Diagnosis present

## 2020-06-30 DIAGNOSIS — C679 Malignant neoplasm of bladder, unspecified: Secondary | ICD-10-CM

## 2020-06-30 DIAGNOSIS — N186 End stage renal disease: Secondary | ICD-10-CM | POA: Diagnosis present

## 2020-06-30 DIAGNOSIS — Z66 Do not resuscitate: Secondary | ICD-10-CM | POA: Diagnosis not present

## 2020-06-30 DIAGNOSIS — D696 Thrombocytopenia, unspecified: Secondary | ICD-10-CM | POA: Diagnosis not present

## 2020-06-30 DIAGNOSIS — R7881 Bacteremia: Secondary | ICD-10-CM | POA: Diagnosis not present

## 2020-06-30 DIAGNOSIS — R5081 Fever presenting with conditions classified elsewhere: Secondary | ICD-10-CM | POA: Diagnosis not present

## 2020-06-30 DIAGNOSIS — L03313 Cellulitis of chest wall: Secondary | ICD-10-CM | POA: Diagnosis present

## 2020-06-30 DIAGNOSIS — Z515 Encounter for palliative care: Secondary | ICD-10-CM | POA: Diagnosis not present

## 2020-06-30 DIAGNOSIS — Z825 Family history of asthma and other chronic lower respiratory diseases: Secondary | ICD-10-CM | POA: Diagnosis not present

## 2020-06-30 DIAGNOSIS — A498 Other bacterial infections of unspecified site: Secondary | ICD-10-CM | POA: Diagnosis not present

## 2020-06-30 DIAGNOSIS — M1990 Unspecified osteoarthritis, unspecified site: Secondary | ICD-10-CM | POA: Diagnosis present

## 2020-06-30 DIAGNOSIS — R5381 Other malaise: Secondary | ICD-10-CM | POA: Diagnosis not present

## 2020-06-30 DIAGNOSIS — E871 Hypo-osmolality and hyponatremia: Secondary | ICD-10-CM | POA: Diagnosis present

## 2020-06-30 DIAGNOSIS — N179 Acute kidney failure, unspecified: Secondary | ICD-10-CM | POA: Diagnosis present

## 2020-06-30 DIAGNOSIS — Z8249 Family history of ischemic heart disease and other diseases of the circulatory system: Secondary | ICD-10-CM | POA: Diagnosis not present

## 2020-06-30 DIAGNOSIS — T451X5A Adverse effect of antineoplastic and immunosuppressive drugs, initial encounter: Secondary | ICD-10-CM | POA: Diagnosis not present

## 2020-06-30 DIAGNOSIS — Z8546 Personal history of malignant neoplasm of prostate: Secondary | ICD-10-CM | POA: Diagnosis not present

## 2020-06-30 DIAGNOSIS — A419 Sepsis, unspecified organism: Secondary | ICD-10-CM | POA: Diagnosis not present

## 2020-06-30 DIAGNOSIS — B965 Pseudomonas (aeruginosa) (mallei) (pseudomallei) as the cause of diseases classified elsewhere: Secondary | ICD-10-CM | POA: Diagnosis not present

## 2020-06-30 DIAGNOSIS — Z743 Need for continuous supervision: Secondary | ICD-10-CM | POA: Diagnosis not present

## 2020-06-30 DIAGNOSIS — E872 Acidosis: Secondary | ICD-10-CM | POA: Diagnosis present

## 2020-06-30 DIAGNOSIS — Z20822 Contact with and (suspected) exposure to covid-19: Secondary | ICD-10-CM | POA: Diagnosis present

## 2020-06-30 DIAGNOSIS — D61810 Antineoplastic chemotherapy induced pancytopenia: Secondary | ICD-10-CM | POA: Diagnosis present

## 2020-06-30 DIAGNOSIS — I4819 Other persistent atrial fibrillation: Secondary | ICD-10-CM | POA: Diagnosis present

## 2020-06-30 DIAGNOSIS — R531 Weakness: Secondary | ICD-10-CM | POA: Diagnosis present

## 2020-06-30 DIAGNOSIS — D61818 Other pancytopenia: Secondary | ICD-10-CM | POA: Diagnosis not present

## 2020-06-30 DIAGNOSIS — A4152 Sepsis due to Pseudomonas: Secondary | ICD-10-CM | POA: Diagnosis present

## 2020-06-30 DIAGNOSIS — A09 Infectious gastroenteritis and colitis, unspecified: Secondary | ICD-10-CM | POA: Diagnosis not present

## 2020-06-30 DIAGNOSIS — G9341 Metabolic encephalopathy: Secondary | ICD-10-CM | POA: Diagnosis not present

## 2020-06-30 DIAGNOSIS — N185 Chronic kidney disease, stage 5: Secondary | ICD-10-CM | POA: Diagnosis not present

## 2020-06-30 DIAGNOSIS — R4182 Altered mental status, unspecified: Secondary | ICD-10-CM | POA: Diagnosis not present

## 2020-06-30 DIAGNOSIS — I482 Chronic atrial fibrillation, unspecified: Secondary | ICD-10-CM | POA: Diagnosis not present

## 2020-06-30 DIAGNOSIS — G629 Polyneuropathy, unspecified: Secondary | ICD-10-CM | POA: Diagnosis not present

## 2020-06-30 DIAGNOSIS — R197 Diarrhea, unspecified: Secondary | ICD-10-CM | POA: Diagnosis not present

## 2020-06-30 DIAGNOSIS — K529 Noninfective gastroenteritis and colitis, unspecified: Secondary | ICD-10-CM | POA: Diagnosis not present

## 2020-06-30 DIAGNOSIS — A0472 Enterocolitis due to Clostridium difficile, not specified as recurrent: Secondary | ICD-10-CM | POA: Diagnosis present

## 2020-06-30 DIAGNOSIS — E870 Hyperosmolality and hypernatremia: Secondary | ICD-10-CM | POA: Diagnosis not present

## 2020-06-30 DIAGNOSIS — I959 Hypotension, unspecified: Secondary | ICD-10-CM | POA: Diagnosis not present

## 2020-06-30 DIAGNOSIS — K521 Toxic gastroenteritis and colitis: Secondary | ICD-10-CM | POA: Diagnosis not present

## 2020-06-30 DIAGNOSIS — R58 Hemorrhage, not elsewhere classified: Secondary | ICD-10-CM | POA: Diagnosis not present

## 2020-06-30 DIAGNOSIS — Z7409 Other reduced mobility: Secondary | ICD-10-CM | POA: Diagnosis not present

## 2020-06-30 LAB — CBC WITH DIFFERENTIAL (CANCER CENTER ONLY)
Abs Immature Granulocytes: 0.02 10*3/uL (ref 0.00–0.07)
Basophils Absolute: 0 10*3/uL (ref 0.0–0.1)
Basophils Relative: 0 %
Eosinophils Absolute: 0.1 10*3/uL (ref 0.0–0.5)
Eosinophils Relative: 4 %
HCT: 28.8 % — ABNORMAL LOW (ref 39.0–52.0)
Hemoglobin: 9.4 g/dL — ABNORMAL LOW (ref 13.0–17.0)
Immature Granulocytes: 1 %
Lymphocytes Relative: 16 %
Lymphs Abs: 0.6 10*3/uL — ABNORMAL LOW (ref 0.7–4.0)
MCH: 32.8 pg (ref 26.0–34.0)
MCHC: 32.6 g/dL (ref 30.0–36.0)
MCV: 100.3 fL — ABNORMAL HIGH (ref 80.0–100.0)
Monocytes Absolute: 0.1 10*3/uL (ref 0.1–1.0)
Monocytes Relative: 3 %
Neutro Abs: 2.8 10*3/uL (ref 1.7–7.7)
Neutrophils Relative %: 76 %
Platelet Count: 37 10*3/uL — ABNORMAL LOW (ref 150–400)
RBC: 2.87 MIL/uL — ABNORMAL LOW (ref 4.22–5.81)
RDW: 14.1 % (ref 11.5–15.5)
WBC Count: 3.6 10*3/uL — ABNORMAL LOW (ref 4.0–10.5)
nRBC: 0 % (ref 0.0–0.2)

## 2020-06-30 LAB — CMP (CANCER CENTER ONLY)
ALT: 21 U/L (ref 0–44)
AST: 20 U/L (ref 15–41)
Albumin: 3.8 g/dL (ref 3.5–5.0)
Alkaline Phosphatase: 68 U/L (ref 38–126)
Anion gap: 11 (ref 5–15)
BUN: 82 mg/dL — ABNORMAL HIGH (ref 8–23)
CO2: 19 mmol/L — ABNORMAL LOW (ref 22–32)
Calcium: 8.6 mg/dL — ABNORMAL LOW (ref 8.9–10.3)
Chloride: 101 mmol/L (ref 98–111)
Creatinine: 6 mg/dL (ref 0.61–1.24)
GFR, Estimated: 10 mL/min — ABNORMAL LOW (ref >60)
Glucose, Bld: 167 mg/dL — ABNORMAL HIGH (ref 70–99)
Potassium: 3.9 mmol/L (ref 3.5–5.1)
Sodium: 131 mmol/L — ABNORMAL LOW (ref 135–145)
Total Bilirubin: 0.6 mg/dL (ref 0.3–1.2)
Total Protein: 6.6 g/dL (ref 6.5–8.1)

## 2020-06-30 LAB — PHOSPHORUS: Phosphorus: 4.4 mg/dL (ref 2.5–4.6)

## 2020-06-30 LAB — MAGNESIUM: Magnesium: 1.6 mg/dL — ABNORMAL LOW (ref 1.7–2.4)

## 2020-06-30 MED ORDER — SODIUM CHLORIDE 0.9 % IV SOLN
10.0000 mg | Freq: Once | INTRAVENOUS | Status: AC
  Administered 2020-06-30: 10 mg via INTRAVENOUS
  Filled 2020-06-30: qty 10

## 2020-06-30 MED ORDER — PALONOSETRON HCL INJECTION 0.25 MG/5ML
INTRAVENOUS | Status: AC
  Filled 2020-06-30: qty 5

## 2020-06-30 MED ORDER — SODIUM CHLORIDE 0.9 % IV SOLN
150.0000 mg | Freq: Once | INTRAVENOUS | Status: AC
  Administered 2020-06-30: 150 mg via INTRAVENOUS
  Filled 2020-06-30: qty 150

## 2020-06-30 MED ORDER — FAMOTIDINE IN NACL 20-0.9 MG/50ML-% IV SOLN
INTRAVENOUS | Status: AC
  Filled 2020-06-30: qty 50

## 2020-06-30 MED ORDER — ACETAMINOPHEN 325 MG PO TABS
ORAL_TABLET | ORAL | Status: AC
  Filled 2020-06-30: qty 2

## 2020-06-30 MED ORDER — PALONOSETRON HCL INJECTION 0.25 MG/5ML
0.2500 mg | Freq: Once | INTRAVENOUS | Status: AC
  Administered 2020-06-30: 0.25 mg via INTRAVENOUS

## 2020-06-30 MED ORDER — PEGFILGRASTIM 6 MG/0.6ML ~~LOC~~ PSKT
PREFILLED_SYRINGE | SUBCUTANEOUS | Status: AC
  Filled 2020-06-30: qty 0.6

## 2020-06-30 MED ORDER — DIPHENHYDRAMINE HCL 50 MG/ML IJ SOLN
INTRAMUSCULAR | Status: AC
  Filled 2020-06-30: qty 1

## 2020-06-30 MED ORDER — SODIUM CHLORIDE 0.9 % IV SOLN
5.7600 mg/kg | Freq: Once | INTRAVENOUS | Status: AC
  Administered 2020-06-30: 720 mg via INTRAVENOUS
  Filled 2020-06-30: qty 72

## 2020-06-30 MED ORDER — PEGFILGRASTIM 6 MG/0.6ML ~~LOC~~ PSKT
6.0000 mg | PREFILLED_SYRINGE | Freq: Once | SUBCUTANEOUS | Status: AC
  Administered 2020-06-30: 6 mg via SUBCUTANEOUS

## 2020-06-30 MED ORDER — DIPHENHYDRAMINE HCL 50 MG/ML IJ SOLN
50.0000 mg | Freq: Once | INTRAMUSCULAR | Status: AC
  Administered 2020-06-30: 50 mg via INTRAVENOUS

## 2020-06-30 MED ORDER — ACETAMINOPHEN 325 MG PO TABS
650.0000 mg | ORAL_TABLET | Freq: Once | ORAL | Status: AC
  Administered 2020-06-30: 650 mg via ORAL

## 2020-06-30 MED ORDER — HEPARIN SOD (PORK) LOCK FLUSH 100 UNIT/ML IV SOLN
500.0000 [IU] | Freq: Once | INTRAVENOUS | Status: AC | PRN
  Administered 2020-06-30: 500 [IU]
  Filled 2020-06-30: qty 5

## 2020-06-30 MED ORDER — SODIUM CHLORIDE 0.9 % IV SOLN
Freq: Once | INTRAVENOUS | Status: AC
  Filled 2020-06-30: qty 250

## 2020-06-30 MED ORDER — ATROPINE SULFATE 1 MG/ML IJ SOLN: 0.5000 mg | Freq: Once | INTRAMUSCULAR | Status: DC | PRN

## 2020-06-30 MED ORDER — FAMOTIDINE IN NACL 20-0.9 MG/50ML-% IV SOLN
20.0000 mg | Freq: Once | INTRAVENOUS | Status: AC
Start: 2020-06-30 — End: 2020-06-30
  Administered 2020-06-30: 20 mg via INTRAVENOUS

## 2020-06-30 MED ORDER — SODIUM CHLORIDE 0.9% FLUSH
10.0000 mL | INTRAVENOUS | Status: DC | PRN
  Administered 2020-06-30: 10 mL
  Filled 2020-06-30: qty 10

## 2020-06-30 NOTE — Patient Instructions (Signed)

## 2020-06-30 NOTE — Patient Instructions (Signed)
Sacituzumab Govitecan Injection What is this medicine? SACITUZUMAB GOVITECAN (SAK i TOOZ ue mab GOE vi TEE kan) is a monoclonal antibody combined with chemotherapy. It is used to treat breast cancer and urothelial cancer. This medicine may be used for other purposes; ask your health care provider or pharmacist if you have questions. COMMON BRAND NAME(S): TRODELVY What should I tell my health care provider before I take this medicine? They need to know if you have any of these conditions:  diarrhea  infection (especially a virus infection such as chickenpox, cold sores, or herpes)  liver disease  low blood counts, like low white cell, platelet, or red cell counts  an unusual or allergic reaction to sacituzumab govitecan, other medicines, foods, dyes, or preservatives  pregnant or trying to get pregnant  breast-feeding How should I use this medicine? This medicine is for infusion into a vein. It is given by a healthcare professional in a hospital or clinic setting. Talk to your pediatrician about the use of this medicine in children. Special care may be needed. Overdosage: If you think you have taken too much of this medicine contact a poison control center or emergency room at once. NOTE: This medicine is only for you. Do not share this medicine with others. What if I miss a dose? Keep appointments for follow-up doses. It is important not to miss your dose. Call your doctor or health care professional if you are unable to keep an appointment. What may interact with this medicine?  certain antivirals for HIV or hepatitis  gemfibrozil This list may not describe all possible interactions. Give your health care provider a list of all the medicines, herbs, non-prescription drugs, or dietary supplements you use. Also tell them if you smoke, drink alcohol, or use illegal drugs. Some items may interact with your medicine. What should I watch for while using this medicine? Your condition  will be monitored carefully while you are receiving this medicine. You may need blood work done while you are taking this medicine. This medicine can cause serious allergic reactions. To reduce your risk, you may need to take medicine before treatment with this medicine. Take your medicine as directed. Do not become pregnant while taking this medicine or for 6 months after stopping it. Women should inform their health care professional if they wish to become pregnant or think they might be pregnant. Men should not father a child while taking this medicine and for 3 months after stopping it. There is potential for serious side effects to an unborn child. Talk to your health care professional for more information. Do not breast-feed a child while taking this medicine or for 1 month after stopping it. This medicine may increase your risk of getting an infection. Call your health care professional for advice if you get a fever, chills, or sore throat, or other symptoms of a cold or flu. Do not treat yourself. Try to avoid being around people who are sick. Avoid taking medicines that contain aspirin, acetaminophen, ibuprofen, naproxen, or ketoprofen unless instructed by your health care professional. These medicines may hide a fever. This medicine has caused ovarian failure in some women. This medicine may make it more difficult to get pregnant. Talk to your health care professional if you are concerned about your fertility. What side effects may I notice from receiving this medicine? Side effects that you should report to your doctor or health care professional as soon as possible:  allergic reactions like skin rash, itching or hives; swelling  of the face, lips, or tongue  diarrhea  nausea/vomiting  signs and symptoms of infection like fever; chills; cough; sore throat; pain or trouble passing urine  signs and symptoms of low red blood cells or anemia such as unusually weak or tired; feeling faint or  lightheaded; falls; breathing problems Side effects that usually do not require medical attention (report these to your doctor or health care professional if they continue or are bothersome):  constipation  hair loss  headache  loss of appetite  signs and symptoms of high blood sugar such as being more thirsty or hungry or having to urinate more than normal. You may also feel very tired or have blurry vision.  trouble sleeping This list may not describe all possible side effects. Call your doctor for medical advice about side effects. You may report side effects to FDA at 1-800-FDA-1088. Where should I keep my medicine? This medicine is given in a hospital or clinic and will not be stored at home. NOTE: This sheet is a summary. It may not cover all possible information. If you have questions about this medicine, talk to your doctor, pharmacist, or health care provider.  2021 [employer] Standard (2019-07-27 11:59:29)

## 2020-06-30 NOTE — Telephone Encounter (Signed)
[name] notified of creat-6.0 and platelet count-37, magnesium-1.6.  OK to treat today today per order of [name].

## 2020-06-30 NOTE — Progress Notes (Signed)
VO " ok to treat despite counts" per [name]

## 2020-07-01 DIAGNOSIS — Z7409 Other reduced mobility: Secondary | ICD-10-CM | POA: Diagnosis not present

## 2020-07-01 DIAGNOSIS — R5381 Other malaise: Secondary | ICD-10-CM | POA: Diagnosis not present

## 2020-07-01 DIAGNOSIS — R2689 Other abnormalities of gait and mobility: Secondary | ICD-10-CM | POA: Diagnosis not present

## 2020-07-01 DIAGNOSIS — R531 Weakness: Secondary | ICD-10-CM | POA: Diagnosis not present

## 2020-07-01 DIAGNOSIS — G629 Polyneuropathy, unspecified: Secondary | ICD-10-CM | POA: Diagnosis not present

## 2020-07-02 ENCOUNTER — Inpatient Hospital Stay (HOSPITAL_COMMUNITY)
Admission: EM | Admit: 2020-07-02 | Discharge: 2020-07-15 | DRG: 871 | Disposition: A | Discharge status: Hospice | Payer: PPO | Attending: Family Medicine | Admitting: Family Medicine

## 2020-07-02 ENCOUNTER — Other Ambulatory Visit: Payer: Self-pay

## 2020-07-02 DIAGNOSIS — R531 Weakness: Secondary | ICD-10-CM

## 2020-07-02 DIAGNOSIS — Z96642 Presence of left artificial hip joint: Secondary | ICD-10-CM | POA: Diagnosis present

## 2020-07-02 DIAGNOSIS — Z20822 Contact with and (suspected) exposure to covid-19: Secondary | ICD-10-CM | POA: Diagnosis present

## 2020-07-02 DIAGNOSIS — G9341 Metabolic encephalopathy: Secondary | ICD-10-CM | POA: Diagnosis not present

## 2020-07-02 DIAGNOSIS — L0291 Cutaneous abscess, unspecified: Secondary | ICD-10-CM

## 2020-07-02 DIAGNOSIS — D61810 Antineoplastic chemotherapy induced pancytopenia: Secondary | ICD-10-CM | POA: Diagnosis present

## 2020-07-02 DIAGNOSIS — I482 Chronic atrial fibrillation, unspecified: Secondary | ICD-10-CM | POA: Diagnosis present

## 2020-07-02 DIAGNOSIS — R040 Epistaxis: Secondary | ICD-10-CM | POA: Diagnosis present

## 2020-07-02 DIAGNOSIS — B965 Pseudomonas (aeruginosa) (mallei) (pseudomallei) as the cause of diseases classified elsewhere: Secondary | ICD-10-CM

## 2020-07-02 DIAGNOSIS — E871 Hypo-osmolality and hyponatremia: Secondary | ICD-10-CM | POA: Diagnosis present

## 2020-07-02 DIAGNOSIS — R58 Hemorrhage, not elsewhere classified: Secondary | ICD-10-CM | POA: Diagnosis not present

## 2020-07-02 DIAGNOSIS — L03313 Cellulitis of chest wall: Secondary | ICD-10-CM | POA: Diagnosis present

## 2020-07-02 DIAGNOSIS — E876 Hypokalemia: Secondary | ICD-10-CM | POA: Diagnosis not present

## 2020-07-02 DIAGNOSIS — D701 Agranulocytosis secondary to cancer chemotherapy: Secondary | ICD-10-CM | POA: Diagnosis not present

## 2020-07-02 DIAGNOSIS — M1990 Unspecified osteoarthritis, unspecified site: Secondary | ICD-10-CM | POA: Diagnosis present

## 2020-07-02 DIAGNOSIS — R5081 Fever presenting with conditions classified elsewhere: Secondary | ICD-10-CM | POA: Diagnosis present

## 2020-07-02 DIAGNOSIS — Z825 Family history of asthma and other chronic lower respiratory diseases: Secondary | ICD-10-CM

## 2020-07-02 DIAGNOSIS — A09 Infectious gastroenteritis and colitis, unspecified: Secondary | ICD-10-CM

## 2020-07-02 DIAGNOSIS — E669 Obesity, unspecified: Secondary | ICD-10-CM | POA: Diagnosis present

## 2020-07-02 DIAGNOSIS — D709 Neutropenia, unspecified: Secondary | ICD-10-CM

## 2020-07-02 DIAGNOSIS — Z6832 Body mass index (BMI) 32.0-32.9, adult: Secondary | ICD-10-CM

## 2020-07-02 DIAGNOSIS — A4152 Sepsis due to Pseudomonas: Principal | ICD-10-CM | POA: Diagnosis present

## 2020-07-02 DIAGNOSIS — Z8249 Family history of ischemic heart disease and other diseases of the circulatory system: Secondary | ICD-10-CM

## 2020-07-02 DIAGNOSIS — L03112 Cellulitis of left axilla: Secondary | ICD-10-CM | POA: Diagnosis present

## 2020-07-02 DIAGNOSIS — E785 Hyperlipidemia, unspecified: Secondary | ICD-10-CM | POA: Diagnosis present

## 2020-07-02 DIAGNOSIS — Z96653 Presence of artificial knee joint, bilateral: Secondary | ICD-10-CM | POA: Diagnosis present

## 2020-07-02 DIAGNOSIS — N186 End stage renal disease: Secondary | ICD-10-CM | POA: Diagnosis present

## 2020-07-02 DIAGNOSIS — Z8546 Personal history of malignant neoplasm of prostate: Secondary | ICD-10-CM

## 2020-07-02 DIAGNOSIS — I959 Hypotension, unspecified: Secondary | ICD-10-CM | POA: Diagnosis not present

## 2020-07-02 DIAGNOSIS — C679 Malignant neoplasm of bladder, unspecified: Secondary | ICD-10-CM | POA: Diagnosis present

## 2020-07-02 DIAGNOSIS — R627 Adult failure to thrive: Secondary | ICD-10-CM | POA: Diagnosis present

## 2020-07-02 DIAGNOSIS — A419 Sepsis, unspecified organism: Secondary | ICD-10-CM

## 2020-07-02 DIAGNOSIS — I120 Hypertensive chronic kidney disease with stage 5 chronic kidney disease or end stage renal disease: Secondary | ICD-10-CM | POA: Diagnosis present

## 2020-07-02 DIAGNOSIS — N179 Acute kidney failure, unspecified: Secondary | ICD-10-CM | POA: Diagnosis present

## 2020-07-02 DIAGNOSIS — N185 Chronic kidney disease, stage 5: Secondary | ICD-10-CM

## 2020-07-02 DIAGNOSIS — I4819 Other persistent atrial fibrillation: Secondary | ICD-10-CM | POA: Diagnosis present

## 2020-07-02 DIAGNOSIS — Z7901 Long term (current) use of anticoagulants: Secondary | ICD-10-CM

## 2020-07-02 DIAGNOSIS — Z515 Encounter for palliative care: Secondary | ICD-10-CM

## 2020-07-02 DIAGNOSIS — E860 Dehydration: Secondary | ICD-10-CM | POA: Diagnosis present

## 2020-07-02 DIAGNOSIS — Z87891 Personal history of nicotine dependence: Secondary | ICD-10-CM

## 2020-07-02 DIAGNOSIS — D61818 Other pancytopenia: Secondary | ICD-10-CM | POA: Diagnosis present

## 2020-07-02 DIAGNOSIS — D696 Thrombocytopenia, unspecified: Secondary | ICD-10-CM | POA: Diagnosis present

## 2020-07-02 DIAGNOSIS — T80211A Bloodstream infection due to central venous catheter, initial encounter: Secondary | ICD-10-CM | POA: Diagnosis present

## 2020-07-02 DIAGNOSIS — I10 Essential (primary) hypertension: Secondary | ICD-10-CM | POA: Diagnosis present

## 2020-07-02 DIAGNOSIS — E870 Hyperosmolality and hypernatremia: Secondary | ICD-10-CM | POA: Diagnosis not present

## 2020-07-02 DIAGNOSIS — Z79899 Other long term (current) drug therapy: Secondary | ICD-10-CM

## 2020-07-02 DIAGNOSIS — E872 Acidosis: Secondary | ICD-10-CM | POA: Diagnosis present

## 2020-07-02 DIAGNOSIS — Y848 Other medical procedures as the cause of abnormal reaction of the patient, or of later complication, without mention of misadventure at the time of the procedure: Secondary | ICD-10-CM | POA: Diagnosis present

## 2020-07-02 DIAGNOSIS — C772 Secondary and unspecified malignant neoplasm of intra-abdominal lymph nodes: Secondary | ICD-10-CM | POA: Diagnosis present

## 2020-07-02 DIAGNOSIS — D638 Anemia in other chronic diseases classified elsewhere: Secondary | ICD-10-CM | POA: Diagnosis present

## 2020-07-02 DIAGNOSIS — D539 Nutritional anemia, unspecified: Secondary | ICD-10-CM | POA: Diagnosis present

## 2020-07-02 DIAGNOSIS — Z66 Do not resuscitate: Secondary | ICD-10-CM | POA: Diagnosis not present

## 2020-07-02 DIAGNOSIS — R7881 Bacteremia: Secondary | ICD-10-CM

## 2020-07-02 DIAGNOSIS — T451X5A Adverse effect of antineoplastic and immunosuppressive drugs, initial encounter: Secondary | ICD-10-CM | POA: Diagnosis present

## 2020-07-02 DIAGNOSIS — R319 Hematuria, unspecified: Secondary | ICD-10-CM | POA: Diagnosis present

## 2020-07-02 DIAGNOSIS — A0472 Enterocolitis due to Clostridium difficile, not specified as recurrent: Secondary | ICD-10-CM | POA: Diagnosis present

## 2020-07-03 ENCOUNTER — Emergency Department (HOSPITAL_COMMUNITY): Payer: PPO

## 2020-07-03 ENCOUNTER — Encounter (HOSPITAL_COMMUNITY): Payer: Self-pay | Admitting: Family Medicine

## 2020-07-03 DIAGNOSIS — I4891 Unspecified atrial fibrillation: Secondary | ICD-10-CM | POA: Diagnosis not present

## 2020-07-03 DIAGNOSIS — N185 Chronic kidney disease, stage 5: Secondary | ICD-10-CM | POA: Diagnosis not present

## 2020-07-03 DIAGNOSIS — R531 Weakness: Secondary | ICD-10-CM

## 2020-07-03 DIAGNOSIS — L02412 Cutaneous abscess of left axilla: Secondary | ICD-10-CM | POA: Diagnosis not present

## 2020-07-03 DIAGNOSIS — K521 Toxic gastroenteritis and colitis: Secondary | ICD-10-CM | POA: Diagnosis not present

## 2020-07-03 DIAGNOSIS — N179 Acute kidney failure, unspecified: Secondary | ICD-10-CM | POA: Diagnosis present

## 2020-07-03 DIAGNOSIS — L03313 Cellulitis of chest wall: Secondary | ICD-10-CM | POA: Diagnosis present

## 2020-07-03 DIAGNOSIS — A498 Other bacterial infections of unspecified site: Secondary | ICD-10-CM | POA: Diagnosis not present

## 2020-07-03 DIAGNOSIS — Z66 Do not resuscitate: Secondary | ICD-10-CM | POA: Diagnosis not present

## 2020-07-03 DIAGNOSIS — L03112 Cellulitis of left axilla: Secondary | ICD-10-CM | POA: Diagnosis present

## 2020-07-03 DIAGNOSIS — D709 Neutropenia, unspecified: Secondary | ICD-10-CM | POA: Diagnosis not present

## 2020-07-03 DIAGNOSIS — E871 Hypo-osmolality and hyponatremia: Secondary | ICD-10-CM | POA: Diagnosis present

## 2020-07-03 DIAGNOSIS — I4819 Other persistent atrial fibrillation: Secondary | ICD-10-CM | POA: Diagnosis present

## 2020-07-03 DIAGNOSIS — D703 Neutropenia due to infection: Secondary | ICD-10-CM | POA: Diagnosis not present

## 2020-07-03 DIAGNOSIS — I482 Chronic atrial fibrillation, unspecified: Secondary | ICD-10-CM | POA: Diagnosis not present

## 2020-07-03 DIAGNOSIS — R5081 Fever presenting with conditions classified elsewhere: Secondary | ICD-10-CM | POA: Diagnosis not present

## 2020-07-03 DIAGNOSIS — D696 Thrombocytopenia, unspecified: Secondary | ICD-10-CM

## 2020-07-03 DIAGNOSIS — A09 Infectious gastroenteritis and colitis, unspecified: Secondary | ICD-10-CM | POA: Diagnosis not present

## 2020-07-03 DIAGNOSIS — Z20822 Contact with and (suspected) exposure to covid-19: Secondary | ICD-10-CM | POA: Diagnosis present

## 2020-07-03 DIAGNOSIS — D61810 Antineoplastic chemotherapy induced pancytopenia: Secondary | ICD-10-CM | POA: Diagnosis present

## 2020-07-03 DIAGNOSIS — G9341 Metabolic encephalopathy: Secondary | ICD-10-CM | POA: Diagnosis not present

## 2020-07-03 DIAGNOSIS — K529 Noninfective gastroenteritis and colitis, unspecified: Secondary | ICD-10-CM | POA: Diagnosis not present

## 2020-07-03 DIAGNOSIS — E870 Hyperosmolality and hypernatremia: Secondary | ICD-10-CM | POA: Diagnosis not present

## 2020-07-03 DIAGNOSIS — C679 Malignant neoplasm of bladder, unspecified: Secondary | ICD-10-CM | POA: Diagnosis present

## 2020-07-03 DIAGNOSIS — R600 Localized edema: Secondary | ICD-10-CM | POA: Diagnosis not present

## 2020-07-03 DIAGNOSIS — D701 Agranulocytosis secondary to cancer chemotherapy: Secondary | ICD-10-CM | POA: Diagnosis present

## 2020-07-03 DIAGNOSIS — M1990 Unspecified osteoarthritis, unspecified site: Secondary | ICD-10-CM | POA: Diagnosis present

## 2020-07-03 DIAGNOSIS — A4152 Sepsis due to Pseudomonas: Secondary | ICD-10-CM | POA: Diagnosis present

## 2020-07-03 DIAGNOSIS — A0472 Enterocolitis due to Clostridium difficile, not specified as recurrent: Secondary | ICD-10-CM | POA: Diagnosis present

## 2020-07-03 DIAGNOSIS — E872 Acidosis: Secondary | ICD-10-CM | POA: Diagnosis present

## 2020-07-03 DIAGNOSIS — R197 Diarrhea, unspecified: Secondary | ICD-10-CM | POA: Diagnosis not present

## 2020-07-03 DIAGNOSIS — Z515 Encounter for palliative care: Secondary | ICD-10-CM | POA: Diagnosis not present

## 2020-07-03 DIAGNOSIS — D638 Anemia in other chronic diseases classified elsewhere: Secondary | ICD-10-CM

## 2020-07-03 DIAGNOSIS — C772 Secondary and unspecified malignant neoplasm of intra-abdominal lymph nodes: Secondary | ICD-10-CM | POA: Diagnosis present

## 2020-07-03 DIAGNOSIS — Y848 Other medical procedures as the cause of abnormal reaction of the patient, or of later complication, without mention of misadventure at the time of the procedure: Secondary | ICD-10-CM | POA: Diagnosis present

## 2020-07-03 DIAGNOSIS — N186 End stage renal disease: Secondary | ICD-10-CM | POA: Diagnosis present

## 2020-07-03 DIAGNOSIS — Z8249 Family history of ischemic heart disease and other diseases of the circulatory system: Secondary | ICD-10-CM | POA: Diagnosis not present

## 2020-07-03 DIAGNOSIS — D61818 Other pancytopenia: Secondary | ICD-10-CM | POA: Diagnosis not present

## 2020-07-03 DIAGNOSIS — T451X5A Adverse effect of antineoplastic and immunosuppressive drugs, initial encounter: Secondary | ICD-10-CM | POA: Diagnosis not present

## 2020-07-03 DIAGNOSIS — Z8546 Personal history of malignant neoplasm of prostate: Secondary | ICD-10-CM | POA: Diagnosis not present

## 2020-07-03 DIAGNOSIS — B965 Pseudomonas (aeruginosa) (mallei) (pseudomallei) as the cause of diseases classified elsewhere: Secondary | ICD-10-CM | POA: Diagnosis not present

## 2020-07-03 DIAGNOSIS — T80211A Bloodstream infection due to central venous catheter, initial encounter: Secondary | ICD-10-CM | POA: Diagnosis present

## 2020-07-03 DIAGNOSIS — I10 Essential (primary) hypertension: Secondary | ICD-10-CM

## 2020-07-03 DIAGNOSIS — Z825 Family history of asthma and other chronic lower respiratory diseases: Secondary | ICD-10-CM | POA: Diagnosis not present

## 2020-07-03 DIAGNOSIS — A419 Sepsis, unspecified organism: Secondary | ICD-10-CM | POA: Diagnosis not present

## 2020-07-03 DIAGNOSIS — R7881 Bacteremia: Secondary | ICD-10-CM | POA: Diagnosis not present

## 2020-07-03 DIAGNOSIS — I120 Hypertensive chronic kidney disease with stage 5 chronic kidney disease or end stage renal disease: Secondary | ICD-10-CM | POA: Diagnosis present

## 2020-07-03 LAB — CBC WITH DIFFERENTIAL/PLATELET
Abs Immature Granulocytes: 0 10*3/uL (ref 0.00–0.07)
Basophils Absolute: 0 10*3/uL (ref 0.0–0.1)
Basophils Relative: 0 %
Eosinophils Absolute: 0 10*3/uL (ref 0.0–0.5)
Eosinophils Relative: 20 %
HCT: 25.8 % — ABNORMAL LOW (ref 39.0–52.0)
HCT: 23.4 % — ABNORMAL LOW (ref 39.0–52.0)
Hemoglobin: 8.6 g/dL — ABNORMAL LOW (ref 13.0–17.0)
Hemoglobin: 7.7 g/dL — ABNORMAL LOW (ref 13.0–17.0)
Immature Granulocytes: 0 %
Lymphocytes Relative: 50 %
Lymphs Abs: 0.1 10*3/uL — ABNORMAL LOW (ref 0.7–4.0)
MCH: 32.8 pg (ref 26.0–34.0)
MCH: 33 pg (ref 26.0–34.0)
MCHC: 33.3 g/dL (ref 30.0–36.0)
MCHC: 32.9 g/dL (ref 30.0–36.0)
MCV: 98.5 fL (ref 80.0–100.0)
MCV: 100.4 fL — ABNORMAL HIGH (ref 80.0–100.0)
Monocytes Absolute: 0 10*3/uL — ABNORMAL LOW (ref 0.1–1.0)
Monocytes Relative: 10 %
Neutro Abs: 0 10*3/uL — CL (ref 1.7–7.7)
Neutrophils Relative %: 20 %
Platelets: 17 10*3/uL — CL (ref 150–400)
Platelets: 24 10*3/uL — CL (ref 150–400)
RBC: 2.62 MIL/uL — ABNORMAL LOW (ref 4.22–5.81)
RBC: 2.33 MIL/uL — ABNORMAL LOW (ref 4.22–5.81)
RDW: 13.7 % (ref 11.5–15.5)
RDW: 13.7 % (ref 11.5–15.5)
WBC: 0.1 10*3/uL — CL (ref 4.0–10.5)
WBC: <0.1 10*3/uL — CL (ref 4.0–10.5)
nRBC: 0 % (ref 0.0–0.2)
nRBC: 0 % (ref 0.0–0.2)

## 2020-07-03 LAB — COMPREHENSIVE METABOLIC PANEL
ALT: 34 U/L (ref 0–44)
AST: 27 U/L (ref 15–41)
Albumin: 3.3 g/dL — ABNORMAL LOW (ref 3.5–5.0)
Alkaline Phosphatase: 54 U/L (ref 38–126)
Anion gap: 11 (ref 5–15)
BUN: 96 mg/dL — ABNORMAL HIGH (ref 8–23)
CO2: 16 mmol/L — ABNORMAL LOW (ref 22–32)
Calcium: 7.9 mg/dL — ABNORMAL LOW (ref 8.9–10.3)
Chloride: 104 mmol/L (ref 98–111)
Creatinine, Ser: 6.35 mg/dL — ABNORMAL HIGH (ref 0.61–1.24)
GFR, Estimated: 9 mL/min — ABNORMAL LOW (ref >60)
Glucose, Bld: 109 mg/dL — ABNORMAL HIGH (ref 70–99)
Potassium: 4.2 mmol/L (ref 3.5–5.1)
Sodium: 131 mmol/L — ABNORMAL LOW (ref 135–145)
Total Bilirubin: 1 mg/dL (ref 0.3–1.2)
Total Protein: 6.3 g/dL — ABNORMAL LOW (ref 6.5–8.1)

## 2020-07-03 LAB — TYPE AND SCREEN
ABO/RH(D): A POS
Antibody Screen: NEGATIVE
Unit division: 0

## 2020-07-03 LAB — RESP PANEL BY RT-PCR (FLU A&B, COVID) ARPGX2
Influenza A by PCR: NEGATIVE
Influenza B by PCR: NEGATIVE
SARS Coronavirus 2 by RT PCR: NEGATIVE

## 2020-07-03 LAB — URINALYSIS, ROUTINE W REFLEX MICROSCOPIC
Bilirubin Urine: NEGATIVE
Glucose, UA: 150 mg/dL — AB
Ketones, ur: NEGATIVE mg/dL
Leukocytes,Ua: NEGATIVE
Nitrite: NEGATIVE
Protein, ur: 100 mg/dL — AB
RBC / HPF: >50 RBC/hpf — ABNORMAL HIGH (ref 0–5)
Specific Gravity, Urine: 1.011 (ref 1.005–1.030)
pH: 6 (ref 5.0–8.0)

## 2020-07-03 LAB — BPAM RBC
Blood Product Expiration Date: 202204182359
Unit Type and Rh: 6200

## 2020-07-03 LAB — PREPARE RBC (CROSSMATCH)

## 2020-07-03 LAB — LACTIC ACID, PLASMA: Lactic Acid, Venous: 1.8 mmol/L (ref 0.5–1.9)

## 2020-07-03 MED ORDER — CHLORHEXIDINE GLUCONATE CLOTH 2 % EX PADS
6.0000 | MEDICATED_PAD | Freq: Every day | CUTANEOUS | Status: DC
  Administered 2020-07-03 – 2020-07-15 (×12): 6 via TOPICAL

## 2020-07-03 MED ORDER — SODIUM CHLORIDE 0.9 % IV SOLN: INTRAVENOUS | Status: DC

## 2020-07-03 MED ORDER — SODIUM CHLORIDE 0.9% IV SOLUTION: Freq: Once | INTRAVENOUS | Status: AC

## 2020-07-03 MED ORDER — ONDANSETRON HCL 4 MG/2ML IJ SOLN
4.0000 mg | Freq: Four times a day (QID) | INTRAMUSCULAR | Status: DC | PRN
  Administered 2020-07-08 – 2020-07-10 (×3): 4 mg via INTRAVENOUS
  Filled 2020-07-03 (×3): qty 2

## 2020-07-03 MED ORDER — ACETAMINOPHEN 325 MG PO TABS
650.0000 mg | ORAL_TABLET | Freq: Four times a day (QID) | ORAL | Status: DC | PRN
  Administered 2020-07-03: 650 mg via ORAL
  Filled 2020-07-03 (×2): qty 2

## 2020-07-03 MED ORDER — DULOXETINE HCL 60 MG PO CPEP
60.0000 mg | ORAL_CAPSULE | Freq: Every day | ORAL | Status: DC
  Administered 2020-07-03 – 2020-07-11 (×9): 60 mg via ORAL
  Filled 2020-07-03 (×10): qty 1

## 2020-07-03 MED ORDER — TBO-FILGRASTIM 480 MCG/0.8ML ~~LOC~~ SOSY
480.0000 ug | PREFILLED_SYRINGE | Freq: Every day | SUBCUTANEOUS | Status: DC
  Administered 2020-07-03 – 2020-07-13 (×11): 480 ug via SUBCUTANEOUS
  Filled 2020-07-03 (×11): qty 0.8

## 2020-07-03 MED ORDER — LACTATED RINGERS IV SOLN: INTRAVENOUS | Status: DC

## 2020-07-03 MED ORDER — SODIUM CHLORIDE 0.9 % IV BOLUS
1000.0000 mL | Freq: Once | INTRAVENOUS | Status: AC
  Administered 2020-07-03: 1000 mL via INTRAVENOUS

## 2020-07-03 MED ORDER — ONDANSETRON HCL 4 MG PO TABS: 4.0000 mg | ORAL_TABLET | Freq: Four times a day (QID) | ORAL | Status: DC | PRN

## 2020-07-03 MED ORDER — SODIUM CHLORIDE 0.9 % IV SOLN
2.0000 g | INTRAVENOUS | Status: DC
  Administered 2020-07-03 – 2020-07-13 (×11): 2 g via INTRAVENOUS
  Filled 2020-07-03 (×12): qty 2

## 2020-07-03 MED ORDER — ACETAMINOPHEN 650 MG RE SUPP: 650.0000 mg | Freq: Four times a day (QID) | RECTAL | Status: DC | PRN

## 2020-07-03 MED ORDER — ENSURE ENLIVE PO LIQD
237.0000 mL | Freq: Two times a day (BID) | ORAL | Status: DC
  Administered 2020-07-04: 237 mL via ORAL

## 2020-07-03 NOTE — H&P (Signed)
History and Physical    [name] Rose WCB:[id number] DOB: October 07, 1950 DOA: 07/02/2020  PCP: Shirline Frees, MD   Patient coming from:   Home  Chief Complaint:  Generalized weakness  HPI: [name] Rose is a 70 y.o. male with medical history significant for bladder cancer, chronic a-fib, CKD 5, anemia, HTN who presents by EMS for evaluation of generalized weakness. He reports he was at home when he became very weak and was not able to walk. He was at his desk and was not able to get up and go to the bedroom to lay down so EMS was called. He states he is globally weak and does not notice any difference in strength in left and right extremities. States he feels better laying down at this time but not sure if he could walk if tried to get up. Has been eating and drinking but reports has been less than normal past few days.  Was started on a new medication recently by his nephrologist.  He is not sure exactly what it was.  Continues to get chemotherapy for his bladder cancer. Last chemotherapy dose was 06/30/20.  He denies any nausea or vomiting denies cough congestion or fever denies chest pain. Lives at home with his wife.  Hx of smoking but quit over 30 years ago.   Review of Systems:  General: Denies fever, chills, weight loss, night sweats.  Denies dizziness.  Denies change in appetite HENT: Denies head trauma, headache, denies change in hearing, tinnitus.  Denies nasal congestion or bleeding.  Denies sore throat, sores in mouth.  Denies difficulty swallowing Eyes: Denies blurry vision, pain in eye, drainage.  Denies discoloration of eyes. Neck: Denies pain.  Denies swelling.  Denies pain with movement. Cardiovascular: Denies chest pain, palpitations.  Denies edema.  Denies orthopnea Respiratory: Denies shortness of breath, cough.  Denies wheezing.  Denies sputum production Gastrointestinal: Denies abdominal pain, swelling.  Denies nausea, vomiting, diarrhea.  Denies melena.  Denies  hematemesis. Musculoskeletal: Denies limitation of movement.  Denies deformity or swelling.  Denies pain.  Denies arthralgias or myalgias. Genitourinary: Denies pelvic pain.  Denies urinary frequency or hesitancy.  Denies dysuria.  Skin: Denies rash.  Denies petechiae, purpura, ecchymosis. Neurological: Denies headache.  Denies syncope.  Denies seizure activity. Denies paresthesia.  Denies slurred speech, drooping face.  Denies visual change. Psychiatric: Denies depression, anxiety. Denies hallucinations.  Past Medical History:  Diagnosis Date  . Anemia   . Arthritis   . 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 ([place]) 09/29/2016  . Bladder tumor   . Chronic renal insufficiency    stage IV  . Dyspnea    with exertion  . Essential hypertension 06/23/2018  . Goals of care, counseling/discussion 09/30/2016  . History of prostate cancer followed by pcp dr [name]-  per pt last PSA undetectable   dx 2008-- (Stage T1c, Gleason 3+3,  PSA 4.58, vol 99cc)  s/p  radical prostatectomy (nerve sparing bilateral)   . Hyperglycemia 06/23/2018  . Hypertension   . Mild hyperlipidemia 06/23/2018  . Persistent atrial fibrillation (HCC)    atrial fib  . Pre-diabetes   . Wears glasses     Past Surgical History:  Procedure Laterality Date  . CARDIOVERSION N/A 05/18/2019   Procedure: CARDIOVERSION;  Surgeon: Lelon Perla, MD;  Location: [facility] ENDOSCOPY;  Service: Cardiovascular;  Laterality: N/A;  . CARDIOVERSION N/A 09/27/2019   Procedure: CARDIOVERSION;  Surgeon: Donato Heinz, MD;  Location: [place];  Service: Cardiovascular;  Laterality:  N/A;  . CATARACT EXTRACTION W/ INTRAOCULAR LENS  IMPLANT, BILATERAL Bilateral 2011  . [place]  . ESOPHAGOGASTRODUODENOSCOPY N/A 02/19/2020   Procedure: ESOPHAGOGASTRODUODENOSCOPY (EGD);  Surgeon: Wilford Corner, MD;  Location: [place];  Service: Gastroenterology;  Laterality: N/A;  . GIVENS CAPSULE STUDY N/A  02/19/2020   Procedure: GIVENS CAPSULE STUDY;  Surgeon: Wilford Corner, MD;  Location: [place];  Service: Gastroenterology;  Laterality: N/A;  . IR FLUORO GUIDE PORT INSERTION RIGHT  10/07/2016  . IR RADIOLOGIST EVAL & MGMT  01/05/2018  . IR US GUIDE VASC ACCESS RIGHT  10/07/2016  . KNEE ARTHROSCOPY Bilateral right 2006;  left 02-15-2007  . [place];  1990;  1983  . ROBOT ASSISTED LAPAROSCOPIC RADICAL PROSTATECTOMY  06/20/2006   bilateral nerve sparing  . TEE WITHOUT CARDIOVERSION N/A 06/27/2018   Procedure: TRANSESOPHAGEAL ECHOCARDIOGRAM (TEE);  Surgeon: Nigel Mormon, MD;  Location: [facility] [place] ENDOSCOPY;  Service: Cardiovascular;  Laterality: N/A;  . TOTAL HIP ARTHROPLASTY Left 03/03/2015   Procedure: LEFT TOTAL HIP ARTHROPLASTY ANTERIOR APPROACH;  Surgeon: Dorna Leitz, MD;  Location: [place];  Service: Orthopedics;  Laterality: Left;  . TOTAL KNEE ARTHROPLASTY Bilateral left 08-13-2009;  right 12-26-2009  . TRANSURETHRAL RESECTION OF BLADDER TUMOR N/A 09/20/2016   Procedure: TRANSURETHRAL RESECTION OF BLADDER TUMOR (TURBT);  Surgeon: Franchot Gallo, MD;  Location: [facility];  Service: Urology;  Laterality: N/A;  . TRANSURETHRAL RESECTION OF BLADDER TUMOR WITH MITOMYCIN-C N/A 08/06/2019   Procedure: TRANSURETHRAL RESECTION OF BLADDER TUMOR;  Surgeon: Franchot Gallo, MD;  Location: [facility];  Service: Urology;  Laterality: N/A;    Social History  reports that he quit smoking about 35 years ago. His smoking use included cigarettes. He has a 16.00 pack-year smoking history. He has never used smokeless tobacco. He reports current alcohol use. He reports current drug use. Drug: Marijuana.  No Known Allergies  Family History  Problem Relation Age of Onset  . Hypertension Mother   . Aneurysm Mother   . Emphysema Father   . Hypertension Father      Prior to Admission medications   Medication Sig Start Date End Date Taking?  Authorizing Provider  apixaban (ELIQUIS) 5 MG TABS tablet Take 1 tablet (5 mg total) by mouth 2 (two) times daily. 06/12/20  Yes Sherran Needs, NP  DULoxetine (CYMBALTA) 60 MG capsule TAKE ONE CAPSULE BY MOUTH DAILY 06/10/20  Yes Volanda Napoleon, MD  furosemide (LASIX) 40 MG tablet Take by mouth. M-w-f 06/26/20  Yes [provider]  Vitamin D, Ergocalciferol, (DRISDOL) 1.25 MG (50000 UNIT) CAPS capsule Take 50,000 Units by mouth once a week. 04/15/20  Yes [provider]  acetaminophen (TYLENOL) 500 MG tablet Take 1,000 mg by mouth every 6 (six) hours as needed for mild pain or headache.    [provider]  amiodarone (PACERONE) 200 MG tablet Take 1 tablet (200 mg total) by mouth daily. Patient not taking: Reported on 07/03/2020 05/28/20   Sherran Needs, NP  cyclobenzaprine (FEXMID) 7.5 MG tablet Take 1 tablet (7.5 mg total) by mouth 3 (three) times daily as needed for muscle spasms. Patient not taking: No sig reported 12/31/19   Volanda Napoleon, MD  famotidine (PEPCID) 20 MG tablet Take 20 mg by mouth 2 (two) times daily.    [provider]  lidocaine-prilocaine (EMLA) cream Apply to affected area once Patient not taking: Reported on 07/03/2020 12/31/19   Volanda Napoleon, MD  loperamide (IMODIUM A-D) 2 MG tablet Take 2 at diarrhea onset, then 1 every 2hr until 12hrs with no BM. May take 2 every 4hrs at night. If diarrhea recurs repeat. Patient not taking: No sig reported 12/31/19   Volanda Napoleon, MD  LORazepam (ATIVAN) 0.5 MG tablet Take 0.5 mg by mouth every 6 (six) hours as needed. Patient not taking: No sig reported    [provider]  magnesium oxide (MAG-OX) 400 MG tablet Take 1 tablet (400 mg total) by mouth 2 (two) times daily. Patient not taking: Reported on 07/03/2020 10/05/19   Sherran Needs, NP  potassium chloride (KLOR-CON) 10 MEQ tablet Take 2 tablets (20 mEq total) by mouth daily. Patient not taking: Reported on 07/03/2020 03/31/20    Thompson Grayer, MD  prochlorperazine (COMPAZINE) 10 MG tablet Take 1 tablet (10 mg total) by mouth every 6 (six) hours as needed (Nausea or vomiting). Patient not taking: No sig reported 12/31/19   Volanda Napoleon, MD  pyridoxine (B-6) 100 MG tablet Take 200 mg by mouth daily with breakfast.     [provider]  sodium bicarbonate 650 MG tablet Take 2 tablets (1,300 mg total) by mouth 2 (two) times daily. Patient not taking: Reported on 07/03/2020 02/25/20   Desiree Hane, MD    Physical Exam: Vitals:   07/03/20 0200 07/03/20 0230 07/03/20 0300 07/03/20 0330  BP: 117/61 (!) 106/55 (!) 102/54 (!) 113/57  Pulse: 75 83 81 81  Resp: $Remo'18 18 18 16  'BdWSI$ Temp:      TempSrc:      SpO2: 100% 99% 99% 98%  Weight:      Height:        Constitutional: NAD, calm, comfortable Vitals:   07/03/20 0200 07/03/20 0230 07/03/20 0300 07/03/20 0330  BP: 117/61 (!) 106/55 (!) 102/54 (!) 113/57  Pulse: 75 83 81 81  Resp: $Remo'18 18 18 16  'zGzZu$ Temp:      TempSrc:      SpO2: 100% 99% 99% 98%  Weight:      Height:       General: WDWN, Alert and oriented x3.  Eyes: EOMI, PERRL, conjunctivae pale pink.  Sclera nonicteric HENT:  [place]/AT, external ears normal.  Nares patent. Dried blood at left nares opening.  Mucous membranes are moist.   Neck: Soft, normal range of motion, supple, no masses, no thyromegaly. Trachea midline Respiratory: clear to auscultation bilaterally, no wheezing, no crackles. Normal respiratory effort. No accessory muscle use.  Cardiovascular: Regular rate and rhythm, no murmurs / rubs / gallops. No extremity edema. 2+ pedal pulses.  Abdomen: Soft, no tenderness, nondistended, no rebound or guarding.  No masses palpated. Bowel sounds normoactive Musculoskeletal: FROM. no cyanosis. No joint deformity upper and lower extremities. Normal muscle tone.  Skin: Warm, dry, intact no rashes, lesions, ulcers. No induration Neurologic: CN 2-12 grossly intact.  Normal speech. Sensation intact, Strength  4/5 in all extremities.   Psychiatric: Normal judgment and insight.  Normal mood.    Labs on Admission: I have personally reviewed following labs and imaging studies  CBC: Recent Labs  Lab 06/30/20 1030 07/03/20 0009  WBC 3.6* 0.1*  NEUTROABS 2.8 0.0*  HGB 9.4* 8.6*  HCT 28.8* 25.8*  MCV 100.3* 98.5  PLT 37* 17*    Basic Metabolic Panel: Recent Labs  Lab 06/30/20 1030 07/03/20 0009  NA 131* 131*  K 3.9 4.2  CL 101 104  CO2 19* 16*  GLUCOSE 167* 109*  BUN 82* 96*  CREATININE 6.00* 6.35*  CALCIUM 8.6* 7.9*  MG 1.6*  --   PHOS 4.4  --     GFR: Estimated Creatinine Clearance: 15.1 mL/min (A) (by C-G formula based on SCr of 6.35 mg/dL (H)).  Liver Function Tests: Recent Labs  Lab 06/30/20 1030 07/03/20 0009  AST 20 27  ALT 21 34  ALKPHOS 68 54  BILITOT 0.6 1.0  PROT 6.6 6.3*  ALBUMIN 3.8 3.3*    Urine analysis:    Component Value Date/Time   COLORURINE YELLOW 07/03/2020 0009   APPEARANCEUR CLEAR 07/03/2020 0009   LABSPEC 1.011 07/03/2020 0009   PHURINE 6.0 07/03/2020 0009   GLUCOSEU 150 (A) 07/03/2020 0009   HGBUR LARGE (A) 07/03/2020 0009   BILIRUBINUR NEGATIVE 07/03/2020 0009   KETONESUR NEGATIVE 07/03/2020 0009   PROTEINUR 100 (A) 07/03/2020 0009   UROBILINOGEN 0.2 01/31/2010 0624   NITRITE NEGATIVE 07/03/2020 0009   LEUKOCYTESUR NEGATIVE 07/03/2020 0009    Radiological Exams on Admission: DG Chest Port 1 View  Result Date: 07/03/2020 CLINICAL DATA:  Weakness EXAM: PORTABLE CHEST 1 VIEW COMPARISON:  September 24, 2018 FINDINGS: The heart size and mediastinal contours are mildly enlarged. A right-sided MediPort catheter seen with the tip at the superior cavoatrial junction. Both lungs are clear. The visualized skeletal structures are unremarkable. IMPRESSION: No active disease. Electronically Signed   By: Prudencio Pair M.D.   On: 07/03/2020 01:44    Assessment/Plan Active Problems:   Neutropenia  Mr. Chui is admitted to telemetry floor.  Profound neutropenia which is due to chemotherapy. Last chemotherapy was on 06/30/20.  Consult oncology in am No fever. No signs of infection. Blood cultures obtained in ER and will monitor. No antibiotics at this time. Discussed with pharmacy who agrees that no indication for antibiotics at this time.    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 Pt is followed by oncology and on chemotherapy.      Thrombocytopenia Platelet count less than 20,000 with mild nose bleed and hematuria. Platelet transfusion ordered by ER.  Monitor CBC.  Followed by Heme/Onc.     Essential hypertension Stable. Pt currently off all antihypertensives. Monitor BP closely.     Chronic atrial fibrillation  Chronic. Anticoagulation held with profound thrombocytopenia.       CKD (chronic kidney disease) stage 5, GFR less than 15 ml/min  Stable.    Weakness generalized Consult PT for evaluation.  Fall precautions.     Anemia of chronic disease Stable. Recheck CBC in am.     DVT prophylaxis: SCDs for DVT prophylaxis Code Status:   Full code Family Communication:  Diagnosis and plan discussed with patient.  Patient verbalized understanding agrees with plan.  Further recommendations to follow as clinically indicated Disposition Plan:   Patient is from:  Home  Anticipated DC to:  Home  Anticipated DC date:  Anticipate at least 2 midnights in hospital to treat acute condition  Anticipated DC barriers: No barriers to discharge identified at this time  Admission status:  Inpatient   Yevonne Aline Chotiner MD [facility]  How to contact the [facility] Attending or Consulting provider [place] or covering provider during after hours [place], for this patient?   1. Check the care team in [facility] and look for a) attending/consulting [facility] provider listed and b) the [facility] team listed 2. Log into [email or web address] and use 's universal password to access. If you do not have the password, please contact  the hospital operator. 3. Locate the  [place] provider you are looking for under [facility] and page to a number that you can be directly reached. 4. If you still have difficulty reaching the provider, please page the [facility] (Director on Call) for the Hospitalists listed on amion for assistance.  07/03/2020, 3:36 AM

## 2020-07-03 NOTE — Progress Notes (Signed)
Care started prior to midnight in the emergency room and patient was admitted early this morning after midnight by Dr. Harrold Donath and I am in current agreement with his assessment and plan.  Additional changes to the plan of care been made accordingly.  The patient is a 70 year old obese Caucasian male with a past medical history significant for but not limited to bladder cancer, chronic atrial fibrillation, CKD stage V, history of chronic anemia secondary to chronic kidney disease, hypertension as well as other comorbidities who presented via EMS for generalized weakness.  He reportedly was at home became very weak and was not able to stand and was at his desk and was unable to get up to go to the bedroom to lay down and EMS was called.  He states that he is globally weak and did not notice any difference in the strength left right extremities.  He stated that he felt better laying down and states he been eating and drinking but reports that it is less than last few days.  He was started on new medication by his nephrologist but does not know what it was.  He is currently on chemotherapy for his bladder cancer last chemo dose was on 06/30/2020 with Ivette Loyal.  Further work-up revealed the patient was pancytopenic on admission and significantly neutropenic.  He was transfused platelets in the ED given his thrombocytopenia secondary to his mild nosebleed and hematology oncology was consulted and recommending transfusion of PRBCs now given his low blood count as well.  He is currently on anticoagulation for his chronic atrial fibrillation.  He has been admitted for and treated for the following but not limited to:   Pancytopenia secondary to antineoplastic therapy -Patient presented with a neutropenia, thrombocytopenia and anemia; see below -He was evaluated by Oncolocgy; status post day 8 of Trodelvy on 06/10/2020 and received G-CSF with chemo and they feel that his WBC will slowly recover the next 2 days -He  is severely neutropenic and they feel he has an infection in the left axilla concerning for early cellulitis but due to severe neutropenia medical oncology has started the patient on IV cefepime  Neutropenia  -Mr. Dokes is admitted to telemetry floor. Profound neutropenia which is due to chemotherapy. Last chemotherapy was on 06/30/20.  -Oncology consulted and recommending Abx and feel his WBC will recover slowly in the next few days -No fever. There were No signs of infection initially but it seems like he has a Cellulitis in the Left Axilla. Blood cultures obtained in ER and will monitor. -Patient's WBC has gone from 7.8 -> 3.6 -> 0.1 and is now <0.1 today -Continue to Monitor and Trend -Repeat CBC in the AM  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 -Pt is followed by oncology and on chemotherapy.  -See Above Heme/Onc following    Thrombocytopenia -Platelet count less than 20,000 with mild nose bleed and hematuria. Platelet transfusion ordered by ER.  -Platelet Count went from 115 -> 37 -> 17 -> 24 (after Transfusion) -Followed by Heme/Onc.   Essential Hypertension -Stable. Pt currently off all antihypertensives.  -Continue to Monitor BP per Protocol -Last BP was 118/79  Chronic Atrial Fibrillation  -Chronic. Anticoagulation held with profound thrombocytopenia until Platelet Count is at least >50,000 -Continue to Monitor on Telemetry    CKD (chronic kidney disease) stage 5, GFR less than 15 ml/min  Metabolic Acidosis -Relatively Stable. -Patient's BUN/Cr went from 62/6.42 -> 82/6.00 -> 96/6.35 -Patient's CO2 is 16, AG 11, and Chloride  was 104 -Avoid Nephrotoxic Medications, Contrast Dyes, Hypotension and Renally Adjust Medications -Repeat CMP in the AM   Generalized Debility and Weakness  -Consult PT for evaluation.  -C/w Fall precautions.   Hyponatremia -Paitent's Na+ is 131 -Continue to Monitor and trend -Given a 1000 mL bolus -Will start  Maintenance IVF with NS at 75 mL/hr -Repeat CMP in the AM  Anemia of Chronic disease/Macrocytic Anemia -Patient's Hgb/Hct went from 10.2/31.1 -> 9.4/28.8 -> 8.6/25.8 -> 7.7/23.4 -Heme/Onc will give 1 unit of pRBCs to keep Hgb/Hct >8 -Continue to Monitor for S/Sx of Bleeding -Repeat CBC in the AM   Obesity -Complicates overall prognosis and Care -Estimated body mass index is 32.89 kg/m as calculated from the following:   Height as of this encounter: $RemoveBeforeD'6\' 2"'[id number]$  (1.88 m).   Weight as of this encounter: 116.2 kg. -Weight Loss and Dietary Counseling given  -Continue to Monitor for S/Sx of Bleeding; Had some Hematuria (U/A) and Epistaxis  -Repeat CBC in the AM  We will continue to monitor the patient's clinical response to intervention and follow-up on specialist recommendations and repeat blood work in the AM

## 2020-07-03 NOTE — Progress Notes (Signed)
Pharmacy Antibiotic Note  [name] Rose is a 69 y.o. male with a h/o bladder cancer who received  chemo 3/21 admitted on 07/02/2020 with weakness. Patient is pancytopenic with ANC 0.0. Noted h/o CKD V. Pharmacy has been consulted for cefepime dosing for cellulitis in the setting of neutropenia.   Plan: Cefepime 2 g iv q 24 hours.   Will f/u renal function, culture results, and clinical course.  Height: $Remove'6\' 2"'WGyGaUg$  (188 cm) Weight: 116.2 kg (256 lb 2.8 oz) IBW/kg (Calculated) : 82.2  Temp (24hrs), Avg:98.4 F (36.9 C), Min:98.4 F (36.9 C), Max:98.4 F (36.9 C)  Recent Labs  Lab 06/30/20 1030 07/03/20 0009 07/03/20 0132 07/03/20 0757  WBC 3.6* 0.1*  --  <0.1*  CREATININE 6.00* 6.35*  --   --   LATICACIDVEN  --   --  1.8  --     Estimated Creatinine Clearance: 15.1 mL/min (A) (by C-G formula based on SCr of 6.35 mg/dL (H)).    No Known Allergies  Antimicrobials this admission: 3/24 cefepime >>   Dose adjustments this admission:  Microbiology results: 3/24 BCx:  3/24 Resp panel: covid neg, flu neg  Thank you for allowing pharmacy to be a part of this patient's care.  Napoleon Form 07/03/2020 12:43 PM

## 2020-07-03 NOTE — ED Notes (Signed)
Patient's linens changed at this time after bowel movement

## 2020-07-03 NOTE — ED Notes (Signed)
Patient scooted up in bed, u bag changed, again placed to suction, given ice water per request, will continue to monitor

## 2020-07-03 NOTE — ED Notes (Signed)
Patient scooted up in bed, call light within reach, will continue to monitor

## 2020-07-03 NOTE — ED Notes (Signed)
Patient taken off of bedpan at this time

## 2020-07-03 NOTE — ED Notes (Signed)
Patient readjusted in bed, given pillow per request, call bell within reach, will continue to monitor

## 2020-07-03 NOTE — Consult Note (Addendum)
[place]  [phone] [phone] [place]  Reason for Consultation: Metastatic bladder cancer, pancytopenia secondary to chemotherapy  HPI: Mr. Rozario is a 70 year old male with a past medical history significant for metastatic bladder cancer, chronic A. fib, CKD stage V, anemia, hypertension.  He presented to the emergency room due to generalized weakness.  On admission, his WBC was 0.1, hemoglobin 8.6, and platelet count 17,000.  His BUN was 96 and creatinine was 6.35 which is overall consistent with his baseline creatinine.  He has not been having any fevers or chills.  Blood cultures have been obtained but antibiotics have not been started due to lack of fever.  He has received 1 unit of platelets so far this admission.  The patient recently restarted treatment with Ivette Loyal.  He received day one of his most recent cycle on 06/23/2020 and received day 8 on 06/30/2020.  He received G-CSF with day 8.  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  His last dose was given on 06/16/2020.  The patient was seen in the emergency room today.  Family are at the bedside.  He reports generalized weakness.  He denies having any fevers or chills.  He denies bleeding.  The patient states that he felt well the day following chemotherapy and was able to do therapy.  However, yesterday he felt worse.  He is not having any headaches, dizziness, chest pain, shortness of breath, abdominal pain, nausea, vomiting.  The patient shows me an area near his left axilla which is reddened and has been having purulent drainage.  He has been putting over-the-counter antibiotic ointment on it and covering with a Band-Aid.  Medical oncology was asked see the patient made recommendations regarding his pancytopenia and metastatic bladder cancer.    Past Medical History:  Diagnosis Date  . Anemia   . Arthritis   . Bladder cancer  metastasized to intra-abdominal lymph nodes ([place]) 09/29/2016  . Bladder tumor   . Chronic renal insufficiency    stage IV  . Dyspnea    with exertion  . Essential hypertension 06/23/2018  . Goals of care, counseling/discussion 09/30/2016  . History of prostate cancer followed by pcp dr [name]-  per pt last PSA undetectable   dx 2008-- (Stage T1c, Gleason 3+3,  PSA 4.58, vol 99cc)  s/p  radical prostatectomy (nerve sparing bilateral)   . Hyperglycemia 06/23/2018  . Hypertension   . Mild hyperlipidemia 06/23/2018  . Persistent atrial fibrillation (H[facility])    atrial fib  . Pre-diabetes   . Wears glasses   :  Past Surgical History:  Procedure Laterality Date  . CARDIOVERSION N/A 05/18/2019   Procedure: CARDIOVERSION;  Surgeon: Lelon Perla, MD;  Location: [facility] ENDOSCOPY;  Service: Cardiovascular;  Laterality: N/A;  . CARDIOVERSION N/A 09/27/2019   Procedure: CARDIOVERSION;  Surgeon: Donato Heinz, MD;  Location: [place];  Service: Cardiovascular;  Laterality: N/A;  . CATARACT EXTRACTION W/ INTRAOCULAR LENS  IMPLANT, BILATERAL Bilateral 2011  . [place]  . ESOPHAGOGASTRODUODENOSCOPY N/A 02/19/2020   Procedure: ESOPHAGOGASTRODUODENOSCOPY (EGD);  Surgeon: Wilford Corner, MD;  Location: [place];  Service: Gastroenterology;  Laterality: N/A;  . GIVENS CAPSULE STUDY N/A 02/19/2020   Procedure: GIVENS CAPSULE STUDY;  Surgeon: Wilford Corner, MD;  Location: [place];  Service: Gastroenterology;  Laterality: N/A;  . IR FLUORO GUIDE PORT INSERTION RIGHT  10/07/2016  . IR RADIOLOGIST EVAL & MGMT  01/05/2018  . IR US GUIDE VASC ACCESS RIGHT  10/07/2016  . KNEE ARTHROSCOPY Bilateral right 2006;  left 02-15-2007  . [place];  1990;  1983  . ROBOT ASSISTED LAPAROSCOPIC RADICAL PROSTATECTOMY  06/20/2006   bilateral nerve sparing  . TEE WITHOUT CARDIOVERSION N/A 06/27/2018   Procedure: TRANSESOPHAGEAL ECHOCARDIOGRAM (TEE);  Surgeon: Nigel Mormon, MD;  Location: [facility] ENDOSCOPY;  Service: Cardiovascular;  Laterality: N/A;  . TOTAL HIP ARTHROPLASTY Left 03/03/2015   Procedure: LEFT TOTAL HIP ARTHROPLASTY ANTERIOR APPROACH;  Surgeon: Dorna Leitz, MD;  Location: [place];  Service: Orthopedics;  Laterality: Left;  . TOTAL KNEE ARTHROPLASTY Bilateral left 08-13-2009;  right 12-26-2009  . TRANSURETHRAL RESECTION OF BLADDER TUMOR N/A 09/20/2016   Procedure: TRANSURETHRAL RESECTION OF BLADDER TUMOR (TURBT);  Surgeon: Franchot Gallo, MD;  Location: [facility];  Service: Urology;  Laterality: N/A;  . TRANSURETHRAL RESECTION OF BLADDER TUMOR WITH MITOMYCIN-C N/A 08/06/2019   Procedure: TRANSURETHRAL RESECTION OF BLADDER TUMOR;  Surgeon: Franchot Gallo, MD;  Location: [facility];  Service: Urology;  Laterality: N/A;  :  Current Facility-Administered Medications  Medication Dose Route Frequency Provider Last Rate Last Admin  . 0.9 %  sodium chloride infusion (Manually program via Guardrails IV Fluids)   Intravenous Once Deno Etienne, DO   Held at 07/03/20 0245   Current Outpatient Medications  Medication Sig Dispense Refill  . acetaminophen (TYLENOL) 500 MG tablet Take 1,000 mg by mouth every 6 (six) hours as needed for mild pain or headache.    Marland Kitchen amiodarone (PACERONE) 200 MG tablet Take 1 tablet (200 mg total) by mouth daily.    Marland Kitchen apixaban (ELIQUIS) 5 MG TABS tablet Take 1 tablet (5 mg total) by mouth 2 (two) times daily. 180 tablet 1  . atorvastatin (LIPITOR) 40 MG tablet Take 40 mg by mouth daily.    . cyclobenzaprine (FEXMID) 7.5 MG tablet Take 1 tablet (7.5 mg total) by mouth 3 (three) times daily as needed for muscle spasms. 60 tablet 2  . DULoxetine (CYMBALTA) 60 MG capsule TAKE ONE CAPSULE BY MOUTH DAILY (Patient taking differently: Take 60 mg by mouth daily.) 30 capsule 3  . famotidine (PEPCID) 20 MG tablet Take 20 mg by mouth 2 (two) times daily.    . furosemide (LASIX) 40 MG tablet Take 40 mg by  mouth See admin instructions. 40mg  on MondayWednesday and Friday    . lidocaine-prilocaine (EMLA) cream Apply 1 application topically as needed (port access).    Marland Kitchen loperamide (IMODIUM A-D) 2 MG tablet Take 2 at diarrhea onset, then 1 every 2hr until 12hrs with no BM. May take 2 every 4hrs at night. If diarrhea recurs repeat. (Patient taking differently: Take 2 mg by mouth as needed for diarrhea or loose stools. Take 2 at diarrhea onset, then 1 every 2hr until 12hrs with no BM. May take 2 every 4hrs at night. If diarrhea recurs repeat.) 100 tablet 1  . magnesium oxide (MAG-OX) 400 MG tablet Take 1 tablet (400 mg total) by mouth 2 (two) times daily.    . metoprolol tartrate (LOPRESSOR) 25 MG tablet Take 25 mg by mouth 2 (two) times daily.    Marland Kitchen oxybutynin (DITROPAN-XL) 10 MG 24 hr tablet Take 10 mg by mouth at bedtime.    . potassium chloride (KLOR-CON) 10 MEQ tablet Take 2 tablets (20 mEq total) by mouth daily. 180 tablet 3  . prochlorperazine (COMPAZINE) 10 MG tablet Take 1 tablet (10 mg total) by  mouth every 6 (six) hours as needed (Nausea or vomiting). 30 tablet 1  . pyridoxine (B-6) 100 MG tablet Take 200 mg by mouth daily with breakfast.     . sodium bicarbonate 650 MG tablet Take 2 tablets (1,300 mg total) by mouth 2 (two) times daily. 120 tablet 1  . Vitamin D, Ergocalciferol, (DRISDOL) 1.25 MG (50000 UNIT) CAPS capsule Take 50,000 Units by mouth once a week.       No Known Allergies:  Family History  Problem Relation Age of Onset  . Hypertension Mother   . Aneurysm Mother   . Emphysema Father   . Hypertension Father   :  Social History   Socioeconomic History  . Marital status: Married    Spouse name: Not on file  . Number of children: Not on file  . Years of education: Not on file  . Highest education level: Not on file  Occupational History  . Not on file  Tobacco Use  . Smoking status: Former Smoker    Packs/day: 1.00    Years: 16.00    Pack years: 16.00    Types:  Cigarettes    Quit date: 09/25/1984    Years since quitting: 35.7  . Smokeless tobacco: Never Used  Vaping Use  . Vaping Use: Never used  Substance and Sexual Activity  . Alcohol use: Yes    Comment: occasionally  . Drug use: Yes    Types: Marijuana    Comment: after chemo to help sleep  . Sexual activity: Not on file  Other Topics Concern  . Not on file  Social History Narrative  . Not on file   Social Determinants of Health   Financial Resource Strain: Not on file  Food Insecurity: No Food Insecurity  . Worried About Charity fundraiser in the Last Year: Never true  . Ran Out of Food in the Last Year: Never true  Transportation Needs: No Transportation Needs  . Lack of Transportation (Medical): No  . Lack of Transportation (Non-Medical): No  Physical Activity: Not on file  Stress: Not on file  Social Connections: Not on file  Intimate Partner Violence: Not on file  :  Review of Systems: A comprehensive 14 point review of systems was negative except as noted in the HPI.  Exam: Patient Vitals for the past 24 hrs:  BP Temp Temp src Pulse Resp SpO2 Height Weight  07/03/20 0945 - - - 65 - - - -  07/03/20 0930 132/72 - - - (!) 23 93 % - -  07/03/20 0730 (!) 115/56 - - 62 20 99 % - -  07/03/20 0700 121/74 - - 63 16 100 % - -  07/03/20 0630 112/68 - - 74 16 100 % - -  07/03/20 0600 115/65 - - 77 16 100 % - -  07/03/20 0530 120/71 - - 78 13 99 % - -  07/03/20 0516 113/68 98.4 F (36.9 C) Oral 74 16 - - -  07/03/20 0515 113/68 - - 78 16 100 % - -  07/03/20 0500 118/61 98.4 F (36.9 C) Oral 69 16 98 % - -  07/03/20 0430 105/71 - - 78 16 100 % - -  07/03/20 0400 (!) 103/53 - - 80 16 99 % - -  07/03/20 0330 (!) 113/57 - - 81 16 98 % - -  07/03/20 0300 (!) 102/54 - - 81 18 99 % - -  07/03/20 0230 (!) 106/55 - - 83 18 99 % - -  07/03/20 0200 117/61 - - 75 18 100 % - -  07/03/20 0130 (!) 130/52 - - 80 16 91 % - -  07/03/20 0110 (!) 161/72 - - 77 18 99 % - -  07/03/20 0030  121/65 - - 73 18 98 % - -  07/03/20 0006 - - - - - - $R'6\' 2"'dY$  (1.88 m) 116.2 kg  07/03/20 0005 118/64 98.4 F (36.9 C) Oral 79 16 98 % - -    General:  well-nourished in no acute distress.   Eyes:  no scleral icterus.   ENT: Dried blood noted on the right side of his mouth, no thrush or mucositis     Lymphatics:  Negative cervical, supraclavicular or axillary adenopathy.   Respiratory: lungs were clear bilaterally without wheezing or crackles.   Cardiovascular:  Regular rate and rhythm, S1/S2, without murmur, rub or gallop.  There was no pedal edema.   GI:  abdomen was soft, flat, nontender, nondistended, without organomegaly.   Musculoskeletal: Strength symmetrical in the upper and lower extremities. Skin: No petechiae, see photo below of left axilla-there is an open area with a small amount of purulent drainage and there appears to be and area that is macerated from a prior Band-Aid.  Neuro exam was nonfocal. Patient was alert and oriented.  Attention was good.   Language was appropriate.  Mood was normal without depression.  Speech was not pressured.  Thought content was not tangential.         Lab Results  Component Value Date   WBC <0.1 (LL) 07/03/2020   HGB 7.7 (L) 07/03/2020   HCT 23.4 (L) 07/03/2020   PLT 24 (LL) 07/03/2020   GLUCOSE 109 (H) 07/03/2020   CHOL 159 10/05/2018   TRIG 131 10/05/2018   HDL 47 10/05/2018   LDLCALC 86 10/05/2018   ALT 34 07/03/2020   AST 27 07/03/2020   NA 131 (L) 07/03/2020   K 4.2 07/03/2020   CL 104 07/03/2020   CREATININE 6.35 (H) 07/03/2020   BUN 96 (H) 07/03/2020   CO2 16 (L) 07/03/2020    NM PET Image Restag (PS) Skull Base To Thigh  Result Date: 06/05/2020 CLINICAL DATA:  Subsequent treatment strategy for bladder cancer in a 70 year old male. EXAM: NUCLEAR MEDICINE PET SKULL BASE TO THIGH TECHNIQUE: 11.9 mCi F-18 FDG was injected intravenously. Full-ring PET imaging was performed from the skull base to thigh after the radiotracer.  CT data was obtained and used for attenuation correction and anatomic localization. Fasting blood glucose: 122 mg/dl COMPARISON:  Multiple prior studies most recent comparison from 03/28/2020 FINDINGS: Mediastinal blood pool activity: SUV max 3.22 Liver activity: SUV max NA NECK: Enlarging hypermetabolic lymph node at the LEFT thoracic inlet in the LEFT low neck (image 55 of series 4 measuring 1.6 cm as compared to 0.9 cm with a maximum SUV of 14.7 as compared to 10.0 Incidental CT findings: none CHEST: Thoracic inlet lymph node as described. No additional hypermetabolic foci in the low neck or chest. Incidental CT findings: Calcified atheromatous plaque in the thoracic aorta without aneurysmal dilation. RIGHT-sided Port-A-Cath terminates at the caval to atrial junction. Heart size is normal without pericardial effusion. Calcified atheromatous plaque of the LEFT coronary circulation and RIGHT coronary artery. Basilar atelectasis. Signs of mild pulmonary emphysema. ABDOMEN/PELVIS: No sign of solid organ uptake. Worsening adenopathy in the pelvis. (Image 181, series 4) bulky RIGHT pelvic lymph node along the external iliac chain measures 3.0 cm with a maximum SUV of 22,  previously 0.9 cm with a maximum SUV of 5.8 (Image 200, series 4) bulky RIGHT inguinal lymph node measuring 3.4 cm previously 1.4 cm with a maximum SUV of 20, previously 16.6 Adjacent hypermetabolic lymph nodes seen anterior to this enlarged groin lymph node now showing an SUV of 15 previously 2.9. Additional smaller lymph nodes seen medial on image 198 of series 4 also with intense (subcentimeter) hypermetabolic activity. Lymph nodes along the RIGHT inguinal crease, for instance on image 191 of series 4 measuring 11 mm with stable size but with increased metabolic activity compared to the prior study with a maximum SUV currently of 9.1 previously with maximum SUV of 2.9. Nodular areas of increased metabolic activity along the RIGHT urinary bladder  wall, associated with bladder wall thickening. Dependent urinary bladder with limited assessment due to layering of excreted FDG. Area of concern on image 189 of series 4 measures approximately 2.5 x 1.3 cm with a maximum SUV of 18, in this location on the prior exam there was in approximately 1 cm area with a maximum SUV of 7.4. Some increased metabolic activity in or adjacent to the colon across the dome of the urinary bladder. Signs of sigmoid diverticulosis. Some generalized colonic activity is present elsewhere though this is more focal and hypermetabolic best seen on image 178 of series 4 with a maximum SUV of 9.5. Incidental CT findings: Noncontrast appearance of liver is stable with presumed cysts which are unchanged. No FDG uptake within the liver to suggest disease as stated above. Gallbladder without pericholecystic stranding. Pancreas with some atrophy as before. Spleen unremarkable. Adrenal glands and kidneys without acute process. No hydronephrosis. No small bowel dilation. Normal appendix. Diverticular disease of the sigmoid colon which is closely approximated to the upper aspect of the urinary bladder with some thickening in this location. No gas in the adjacent urinary bladder. Streak artifact from LEFT hip arthroplasty limiting assessment of pelvic structures. SKELETON: No focal hypermetabolic activity to suggest skeletal metastasis. Incidental CT findings: Signs of LEFT hip arthroplasty. Streak artifact limiting assessment of this location. Spinal degenerative changes. IMPRESSION: Worsening of nodal disease in the pelvis, in the urinary bladder and about the LEFT thoracic inlet as described. More pronounced FDG uptake between the bladder in the colon than on previous imaging. Consider the possibility of primary colonic process including sequela of diverticulitis with waxing and waning features. Colonoscopic correlation could also be helpful. Gallbladder is collapsed on the current study with less  uptake than on previous imaging in this location, attention on follow-up. More nodular areas of uptake along the bladder base are indeterminate but suspicious based on other findings, potentially related to excreted urine. Cystoscopic assessment may be helpful for further evaluation. Aortic Atherosclerosis (ICD10-I70.0) and Emphysema (ICD10-J43.9). Electronically Signed   By: Zetta Bills M.D.   On: 06/05/2020 14:33   DG Chest Port 1 View  Result Date: 07/03/2020 CLINICAL DATA:  Weakness EXAM: PORTABLE CHEST 1 VIEW COMPARISON:  September 24, 2018 FINDINGS: The heart size and mediastinal contours are mildly enlarged. A right-sided MediPort catheter seen with the tip at the superior cavoatrial junction. Both lungs are clear. The visualized skeletal structures are unremarkable. IMPRESSION: No active disease. Electronically Signed   By: Prudencio Pair M.D.   On: 07/03/2020 01:44     NM PET Image Restag (PS) Skull Base To Thigh  Result Date: 06/05/2020 CLINICAL DATA:  Subsequent treatment strategy for bladder cancer in a 70 year old male. EXAM: NUCLEAR MEDICINE PET SKULL BASE TO THIGH TECHNIQUE: 11.9  mCi F-18 FDG was injected intravenously. Full-ring PET imaging was performed from the skull base to thigh after the radiotracer. CT data was obtained and used for attenuation correction and anatomic localization. Fasting blood glucose: 122 mg/dl COMPARISON:  Multiple prior studies most recent comparison from 03/28/2020 FINDINGS: Mediastinal blood pool activity: SUV max 3.22 Liver activity: SUV max NA NECK: Enlarging hypermetabolic lymph node at the LEFT thoracic inlet in the LEFT low neck (image 55 of series 4 measuring 1.6 cm as compared to 0.9 cm with a maximum SUV of 14.7 as compared to 10.0 Incidental CT findings: none CHEST: Thoracic inlet lymph node as described. No additional hypermetabolic foci in the low neck or chest. Incidental CT findings: Calcified atheromatous plaque in the thoracic aorta without  aneurysmal dilation. RIGHT-sided Port-A-Cath terminates at the caval to atrial junction. Heart size is normal without pericardial effusion. Calcified atheromatous plaque of the LEFT coronary circulation and RIGHT coronary artery. Basilar atelectasis. Signs of mild pulmonary emphysema. ABDOMEN/PELVIS: No sign of solid organ uptake. Worsening adenopathy in the pelvis. (Image 181, series 4) bulky RIGHT pelvic lymph node along the external iliac chain measures 3.0 cm with a maximum SUV of 22, previously 0.9 cm with a maximum SUV of 5.8 (Image 200, series 4) bulky RIGHT inguinal lymph node measuring 3.4 cm previously 1.4 cm with a maximum SUV of 20, previously 16.6 Adjacent hypermetabolic lymph nodes seen anterior to this enlarged groin lymph node now showing an SUV of 15 previously 2.9. Additional smaller lymph nodes seen medial on image 198 of series 4 also with intense (subcentimeter) hypermetabolic activity. Lymph nodes along the RIGHT inguinal crease, for instance on image 191 of series 4 measuring 11 mm with stable size but with increased metabolic activity compared to the prior study with a maximum SUV currently of 9.1 previously with maximum SUV of 2.9. Nodular areas of increased metabolic activity along the RIGHT urinary bladder wall, associated with bladder wall thickening. Dependent urinary bladder with limited assessment due to layering of excreted FDG. Area of concern on image 189 of series 4 measures approximately 2.5 x 1.3 cm with a maximum SUV of 18, in this location on the prior exam there was in approximately 1 cm area with a maximum SUV of 7.4. Some increased metabolic activity in or adjacent to the colon across the dome of the urinary bladder. Signs of sigmoid diverticulosis. Some generalized colonic activity is present elsewhere though this is more focal and hypermetabolic best seen on image 178 of series 4 with a maximum SUV of 9.5. Incidental CT findings: Noncontrast appearance of liver is stable  with presumed cysts which are unchanged. No FDG uptake within the liver to suggest disease as stated above. Gallbladder without pericholecystic stranding. Pancreas with some atrophy as before. Spleen unremarkable. Adrenal glands and kidneys without acute process. No hydronephrosis. No small bowel dilation. Normal appendix. Diverticular disease of the sigmoid colon which is closely approximated to the upper aspect of the urinary bladder with some thickening in this location. No gas in the adjacent urinary bladder. Streak artifact from LEFT hip arthroplasty limiting assessment of pelvic structures. SKELETON: No focal hypermetabolic activity to suggest skeletal metastasis. Incidental CT findings: Signs of LEFT hip arthroplasty. Streak artifact limiting assessment of this location. Spinal degenerative changes. IMPRESSION: Worsening of nodal disease in the pelvis, in the urinary bladder and about the LEFT thoracic inlet as described. More pronounced FDG uptake between the bladder in the colon than on previous imaging. Consider the possibility of primary colonic  process including sequela of diverticulitis with waxing and waning features. Colonoscopic correlation could also be helpful. Gallbladder is collapsed on the current study with less uptake than on previous imaging in this location, attention on follow-up. More nodular areas of uptake along the bladder base are indeterminate but suspicious based on other findings, potentially related to excreted urine. Cystoscopic assessment may be helpful for further evaluation. Aortic Atherosclerosis (ICD10-I70.0) and Emphysema (ICD10-J43.9). Electronically Signed   By: Zetta Bills M.D.   On: 06/05/2020 14:33   DG Chest Port 1 View  Result Date: 07/03/2020 CLINICAL DATA:  Weakness EXAM: PORTABLE CHEST 1 VIEW COMPARISON:  September 24, 2018 FINDINGS: The heart size and mediastinal contours are mildly enlarged. A right-sided MediPort catheter seen with the tip at the superior  cavoatrial junction. Both lungs are clear. The visualized skeletal structures are unremarkable. IMPRESSION: No active disease. Electronically Signed   By: Prudencio Pair M.D.   On: 07/03/2020 01:44   Assessment and Plan:  1.  Metastatic bladder cancer 2.  Pancytopenia secondary to chemotherapy 3.  Possible cellulitis to left axilla 4.  CKD stage V 5.  Chronic atrial fibrillation 6.  Hypertension  -S/P day 8 of Trodelvy on 06/30/2020. Received GCSF with chemo.  Anticipate that his WBC will slowly recover in the next few days.  - Now severely neutropenic. Afebrile. BC pending.  He has an area near his left axilla which is concerning for an early cellulitis.  Due to his severe neutropenia, I have a low threshold to begin antibiotics on him. Recommend starting him on Cefepime. Pharmacy consult placed to help with dosing due to CKD.  -The patient's hemoglobin is down to 7.7 today.  Will give 1 unit PRBCs.  Transfuse to keep hemoglobin above 8. -Status post 1 unit of platelets with improvement of platelet count to 24,000.  Transfuse platelets for platelet count less than 20,000 or active bleeding. -He has been on Eliquis 5 mg twice daily for A. fib.  Given his thrombocytopenia, recommend holding anticoagulation until platelet count is at least 50,000 or higher. -Management of chronic medical conditions per hospitalist.  Mikey Bussing, DNP, AGPCNP-BC, AOCNP   ADDENDUM: I saw and examined Mr. Ryans.  He is well-known to me.  Clearly, he is neutropenic from chemotherapy.  I actually cut the dose of chemotherapy back by was 50%.  Yet, he still is profoundly neutropenic.  He got OnPro whiich would not kick in for another week or so.  He just got this a couple days ago.  It looks like he has cellulitis and these skin infections over on the left axillary area.  This could be the source of infection.  I would not believe that we are going to be able to treat him any further with chemotherapy.  Ivette Loyal  really was a last line of therapy for him.  He started to develop other issues now.  His BUN and creatinine are 96 and 6.35.  A lot of this is from dehydration.  He was supposed to have a AV fistula placed for dialysis.  I am not sure if or when this is going to be done.  He clearly needs broad-spectrum antibiotics.  Despite the renal insufficiency, we really need to get vancomycin on board so we can cover any potential skin infection with staph or strep.  He has atrial fibrillation.  This may be a problem with him being stressed out.  He is on a monitor.  I know that cardiology is following him  closely.  The next 2-3 days will be critical.  I hate to say it but when he gets to the hospital, things just do not go as planned.  I really had to anticipate that he probably will be here another 2 weeks.  This area of cellulitis does look quite significant.  It is quite red despite the fact that he has no white cells.  I will start him on some Neupogen.  We had to try to get his white cell count up if we can.  This will be very important.  I know that he will get outstanding care from all the staff of [facility] [address].  We really need to make sure we check labs daily.  It would not surprise me if his platelet count drops.  I do appreciate everybody's help, again.  [name], MD  Fara Olden 2:32

## 2020-07-03 NOTE — ED Triage Notes (Signed)
Patient BIB EMS for generalized weakness. Per EMS, patient can usually ambulate around house, tonight could not get up from chair to get to bed. Patient arrives incontinent, AxOx4. MD at bedside to evaluate.

## 2020-07-03 NOTE — ED Notes (Signed)
Patient defecated a large amount of liquid stool at this time, linens changed, chucks and brief placed under him at this time, will continue to monitor

## 2020-07-03 NOTE — ED Notes (Signed)
Patient placed on bedpan at this time.

## 2020-07-03 NOTE — ED Notes (Addendum)
Pt placed on bedpan. Pt had a BM and is cleaned up!

## 2020-07-03 NOTE — ED Provider Notes (Signed)
[facility] DEPT Provider Note   CSN: [id number] Arrival date & time: 07/02/20  2350     History Chief Complaint  Patient presents with  . Generalized Weakness    [name] Rose is a 70 y.o. male.  70 yo M with a chief complaints of generalized weakness.  The patient found that he was unable to get up to walk to his room to go to bed this evening.  He felt like otherwise today he was feeling okay.  Denies it is 1 side versus the other.  Just feels globally weak.  Has been eating and drinking but somewhat less than normal.  Was started on a new medication recently by his nephrologist.  He is not sure exactly what it was.  Continues to get chemotherapy for his bladder cancer.  He denies any nausea or vomiting denies cough congestion or fever denies chest pain.  The history is provided by the patient.  Illness Severity:  Moderate Onset quality:  Gradual Duration:  2 days Timing:  Constant Progression:  Worsening Chronicity:  New Associated symptoms: no abdominal pain, no chest pain, no congestion, no diarrhea, no fever, no headaches, no myalgias, no rash, no shortness of breath and no vomiting        Past Medical History:  Diagnosis Date  . Anemia   . Arthritis   . 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 ([place]) 09/29/2016  . Bladder tumor   . Chronic renal insufficiency    stage IV  . Dyspnea    with exertion  . Essential hypertension 06/23/2018  . Goals of care, counseling/discussion 09/30/2016  . History of prostate cancer followed by pcp dr [name]-  per pt last PSA undetectable   dx 2008-- (Stage T1c, Gleason 3+3,  PSA 4.58, vol 99cc)  s/p  radical prostatectomy (nerve sparing bilateral)   . Hyperglycemia 06/23/2018  . Hypertension   . Mild hyperlipidemia 06/23/2018  . Persistent atrial fibrillation (HCC)    atrial fib  . Pre-diabetes   . Wears glasses     Patient Active Problem List   Diagnosis Date Noted  . Melena  02/19/2020  . Abdominal distention   . Ileus ([place]) 02/12/2020  . Atrial fibrillation with RVR ([place])   . Failure to thrive in adult 02/03/2020  . Fall at home, initial encounter 02/03/2020  . Severe muscle deconditioning 10/06/2018  . Chronic atrial fibrillation ([place])   . Acute renal failure with acute tubular necrosis superimposed on stage 3 chronic kidney disease ([place])   . Metastatic cancer ([place])   . Palliative care encounter   . Acute respiratory failure with hypoxia ([place])   . Atypical pneumonia   . CKD (chronic kidney disease), stage III ([place]) 09/08/2018  . Mitral valve insufficiency 08/17/2018  . Drug rash   . Essential hypertension 06/23/2018  . Hyperglycemia 06/23/2018  . Mild hyperlipidemia 06/23/2018  . Mucositis due to chemotherapy 06/22/2018  . AKI (acute kidney injury) ([place]) 06/22/2018  . Pancytopenia ([place]) 06/22/2018  . Staphylococcus aureus bacteremia   . Neutropenic fever ([place]) 06/21/2018  . Pneumonia   . Thrombocytopenia ([place])   . Chronic a-fib ([place])   . Abscess of abdominal cavity ([place]) 12/19/2017  . IDA (iron deficiency anemia) 09/08/2017  . 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 Anorexia 10/17/2016  . Diarrhea 10/17/2016  . LFT elevation 10/17/2016  . Dehydration 10/17/2016  . Fatigue 10/17/2016  . Hypocalcemia 10/17/2016  . Goals of  care, counseling/discussion 09/30/2016  . 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 ([place]) 09/29/2016  . Cancer of dome of urinary bladder ([place]) 09/20/2016  . Postoperative anemia due to acute blood loss 03/04/2015  . Hypokalemia 03/04/2015  . 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 CARDIOVERSION N/A 05/18/2019   Procedure: CARDIOVERSION;  Surgeon: Lelon Perla, MD;  Location[facility]COPY;  Service: Cardiovascular;  Laterality: N/A;  . CARDIOVERSION N/A 09/27/2019   Procedure:  CARDIOVERSION;  Surgeon: Donato Heinz, MD;  Location: [facility];  Service: Cardiovascular;  Laterality: N/A;  . CATARACT EXTRACTION W/ INTRAOCULAR LENS  IMPLANT, BILATERAL Bilateral 2011  . [place]  . ESOPHAGOGASTRODUODENOSCOPY N/A 02/19/2020   Procedure: ESOPHAGOGASTRODUODENOSCOPY (EGD);  Surgeon: Wilford Corner, MD;  Location: [facility];  Service: Gastroenterology;  Laterality: N/A;  . GIVENS CAPSULE STUDY N/A 02/19/2020   Procedure: GIVENS CAPSULE STUDY;  Surgeon: Wilford Corner, MD;  Location: [facility];  Service: Gastroenterology;  Laterality: N/A;  . IR FLUORO GUIDE PORT INSERTION RIGHT  10/07/2016  . IR RADIOLOGIST EVAL & MGMT  01/05/2018  . IR US GUIDE VASC ACCESS RIGHT  10/07/2016  . KNEE ARTHROSCOPY Bilateral right 2006;  left 02-15-2007  . [place];  1990;  1983  . ROBOT ASSISTED LAPAROSCOPIC RADICAL PROSTATECTOMY  06/20/2006   bilateral nerve sparing  . TEE WITHOUT CARDIOVERSION N/A 06/27/2018   Procedure: TRANSESOPHAGEAL ECHOCARDIOGRAM (TEE);  Surgeon: Nigel Mormon, MD;  Location: [facility] ENDOSCOPY;  Service: Cardiovascular;  Laterality: N/A;  . TOTAL HIP ARTHROPLASTY Left 03/03/2015   Procedure: LEFT TOTAL HIP ARTHROPLASTY ANTERIOR APPROACH;  Surgeon: Dorna Leitz, MD;  Location: [facility];  Service: Orthopedics;  Laterality: Left;  . TOTAL KNEE ARTHROPLASTY Bilateral left 08-13-2009;  right 12-26-2009  . TRANSURETHRAL RESECTION OF BLADDER TUMOR N/A 09/20/2016   Procedure: TRANSURETHRAL RESECTION OF BLADDER TUMOR (TURBT);  Surgeon: Franchot Gallo, MD;  Location: [facility];  Service: Urology;  Laterality: N/A;  . TRANSURETHRAL RESECTION OF BLADDER TUMOR WITH MITOMYCIN-C N/A 08/06/2019   Procedure: TRANSURETHRAL RESECTION OF BLADDER TUMOR;  Surgeon: Franchot Gallo, MD;  Location: [facility];  Service: Urology;  Laterality: N/A;       Family History  Problem Relation Age of Onset  .  Hypertension Mother   . Aneurysm Mother   . Emphysema Father   . Hypertension Father     Social History   Tobacco Use  . Smoking status: Former Smoker    Packs/day: 1.00    Years: 16.00    Pack years: 16.00    Types: Cigarettes    Quit date: 09/25/1984    Years since quitting: 35.7  . Smokeless tobacco: Never Used  Vaping Use  . Vaping Use: Never used  Substance Use Topics  . Alcohol use: Yes    Comment: occasionally  . Drug use: Yes    Types: Marijuana    Comment: after chemo to help sleep    Home Medications Prior to Admission medications   Medication Sig Start Date End Date Taking? Authorizing Provider  acetaminophen (TYLENOL) 500 MG tablet Take 1,000 mg by mouth every 6 (six) hours as needed for mild pain or headache.    [provider]  amiodarone (PACERONE) 200 MG tablet Take 1 tablet (200 mg total) by mouth daily. 05/28/20   Sherran Needs, NP  apixaban (ELIQUIS) 5 MG TABS tablet Take 1 tablet (5 mg total)  by mouth 2 (two) times daily. 06/12/20   Sherran Needs, NP  atorvastatin (LIPITOR) 40 MG tablet Take 40 mg by mouth every evening.     [provider]  cyclobenzaprine (FEXMID) 7.5 MG tablet Take 1 tablet (7.5 mg total) by mouth 3 (three) times daily as needed for muscle spasms. Patient not taking: No sig reported 12/31/19   Volanda Napoleon, MD  DULoxetine (CYMBALTA) 60 MG capsule TAKE ONE CAPSULE BY MOUTH DAILY 06/10/20   Volanda Napoleon, MD  famotidine (PEPCID) 20 MG tablet Take 20 mg by mouth 2 (two) times daily.    [provider]  furosemide (LASIX) 40 MG tablet Take by mouth. M-w-f 06/26/20   [provider]  lidocaine-prilocaine (EMLA) cream Apply to affected area once 12/31/19   Volanda Napoleon, MD  loperamide (IMODIUM A-D) 2 MG tablet Take 2 at diarrhea onset, then 1 every 2hr until 12hrs with no BM. May take 2 every 4hrs at night. If diarrhea recurs repeat. Patient not taking: Reported on 06/23/2020 12/31/19   Volanda Napoleon, MD  LORazepam (ATIVAN) 0.5 MG tablet Take 0.5 mg by mouth every 6 (six) hours as needed. Patient not taking: Reported on 06/23/2020    [provider]  magnesium oxide (MAG-OX) 400 MG tablet Take 1 tablet (400 mg total) by mouth 2 (two) times daily. 10/05/19   Sherran Needs, NP  metoprolol tartrate (LOPRESSOR) 25 MG tablet Take 25 mg by mouth 2 (two) times daily.    [provider]  oxybutynin (DITROPAN-XL) 10 MG 24 hr tablet Take 10 mg by mouth at bedtime.    [provider]  potassium chloride (KLOR-CON) 10 MEQ tablet Take 2 tablets (20 mEq total) by mouth daily. Patient taking differently: Take 10 mEq by mouth 2 (two) times daily. 03/31/20   Allred, Jeneen Rinks, MD  prochlorperazine (COMPAZINE) 10 MG tablet Take 1 tablet (10 mg total) by mouth every 6 (six) hours as needed (Nausea or vomiting). Patient not taking: Reported on 06/23/2020 12/31/19   Volanda Napoleon, MD  pyridoxine (B-6) 100 MG tablet Take 200 mg by mouth daily with breakfast.     [provider]  sodium bicarbonate 650 MG tablet Take 2 tablets (1,300 mg total) by mouth 2 (two) times daily. 02/25/20   Desiree Hane, MD  Vitamin D, Ergocalciferol, (DRISDOL) 1.25 MG (50000 UNIT) CAPS capsule Take 50,000 Units by mouth once a week. 04/15/20   [provider]    Allergies    Patient has no known allergies.  Review of Systems   Review of Systems  Constitutional: Negative for chills and fever.  HENT: Negative for congestion and facial swelling.   Eyes: Negative for discharge and visual disturbance.  Respiratory: Negative for shortness of breath.   Cardiovascular: Negative for chest pain and palpitations.  Gastrointestinal: Negative for abdominal pain, diarrhea and vomiting.  Musculoskeletal: Negative for arthralgias and myalgias.  Skin: Negative for color change and rash.  Neurological: Positive for weakness. Negative for tremors, syncope and headaches.  Psychiatric/Behavioral:  Negative for confusion and dysphoric mood.    Physical Exam Updated Vital Signs BP 117/61   Pulse 75   Temp 98.4 F (36.9 C) (Oral)   Resp 18   Ht $R'6\' 2"'zf$  (1.88 m)   Wt 116.2 kg   SpO2 100%   BMI 32.89 kg/m   Physical Exam Vitals and nursing note reviewed.  Constitutional:      Appearance: He is well-developed.  HENT:  Head: Normocephalic and atraumatic.  Eyes:     Pupils: Pupils are equal, round, and reactive to light.  Neck:     Vascular: No JVD.  Cardiovascular:     Rate and Rhythm: Normal rate and regular rhythm.     Heart sounds: No murmur heard. No friction rub. No gallop.   Pulmonary:     Effort: No respiratory distress.     Breath sounds: No wheezing.  Abdominal:     General: There is no distension.     Tenderness: There is no abdominal tenderness. There is no guarding or rebound.  Musculoskeletal:        General: Normal range of motion.     Cervical back: Normal range of motion and neck supple.  Skin:    Coloration: Skin is not pale.     Findings: No rash.  Neurological:     Mental Status: He is alert and oriented to person, place, and time.     Comments: 5 out of 5 muscle strength in all 4 extremities  Psychiatric:        Behavior: Behavior normal.     ED Results / Procedures / Treatments   Labs (all labs ordered are listed, but only abnormal results are displayed) Labs Reviewed  CBC WITH DIFFERENTIAL/PLATELET - Abnormal; Notable for the following components:      Result Value   WBC 0.1 (*)    RBC 2.62 (*)    Hemoglobin 8.6 (*)    HCT 25.8 (*)    Platelets 17 (*)    Neutro Abs 0.0 (*)    Lymphs Abs 0.1 (*)    Monocytes Absolute 0.0 (*)    All other components within normal limits  COMPREHENSIVE METABOLIC PANEL - Abnormal; Notable for the following components:   Sodium 131 (*)    CO2 16 (*)    Glucose, Bld 109 (*)    BUN 96 (*)    Creatinine, Ser 6.35 (*)    Calcium 7.9 (*)    Total Protein 6.3 (*)    Albumin 3.3 (*)    GFR,  Estimated 9 (*)    All other components within normal limits  URINALYSIS, ROUTINE W REFLEX MICROSCOPIC - Abnormal; Notable for the following components:   Glucose, UA 150 (*)    Hgb urine dipstick LARGE (*)    Protein, ur 100 (*)    RBC / HPF >50 (*)    Bacteria, UA RARE (*)    All other components within normal limits  CULTURE, BLOOD (ROUTINE X 2)  CULTURE, BLOOD (ROUTINE X 2)  RESP PANEL BY RT-PCR (FLU A&B, COVID) ARPGX2  LACTIC ACID, PLASMA  LACTIC ACID, PLASMA  PREPARE PLATELET PHERESIS  TYPE AND SCREEN    EKG None  Radiology DG Chest Port 1 View  Result Date: 07/03/2020 CLINICAL DATA:  Weakness EXAM: PORTABLE CHEST 1 VIEW COMPARISON:  September 24, 2018 FINDINGS: The heart size and mediastinal contours are mildly enlarged. A right-sided MediPort catheter seen with the tip at the superior cavoatrial junction. Both lungs are clear. The visualized skeletal structures are unremarkable. IMPRESSION: No active disease. Electronically Signed   By: Jonna Clark M.D.   On: 07/03/2020 01:44    Procedures Procedures   Medications Ordered in ED Medications  0.9 %  sodium chloride infusion (Manually program via Guardrails IV Fluids) (has no administration in time range)  sodium chloride 0.9 % bolus 1,000 mL (1,000 mLs Intravenous New Bag/Given 07/03/20 0023)    ED Course  I have reviewed the triage vital signs and the nursing notes.  Pertinent labs & imaging results that were available during my care of the patient were reviewed by me and considered in my medical decision making (see chart for details).    MDM Rules/Calculators/A&P                          70 yo M with a chief complaint of an inability to stand up and walk to his bed.  Patient had physical therapy done yesterday without any issue.  Felt like he was doing okay today and then tried to get up to go to bed and was unable.  His wife was unable to get him up either.  They called 911 and EMS brought him here.  He denies any  specific symptoms other than he has been eating and drinking a little bit less.  We will give a bolus of IV fluids recheck lab work.  Attempt to ambulate.  Patient found to be profoundly neutropenic.  Also thrombocytopenic.  UA without obvious infection chest x-ray viewed by me without focal infiltrate or pneumothorax.  Patient denies any areas of rash.  With the patient being neutropenic and too weak to stand up will discuss with hospitalist for admission.  Hospitalist recommending platelet transfusion.  Will order.  CRITICAL CARE Performed by: Cecilio Asper   Total critical care time: 35 minutes  Critical care time was exclusive of separately billable procedures and treating other patients.  Critical care was necessary to treat or prevent imminent or life-threatening deterioration.  Critical care was time spent personally by me on the following activities: development of treatment plan with patient and/or surrogate as well as nursing, discussions with consultants, evaluation of patient's response to treatment, examination of patient, obtaining history from patient or surrogate, ordering and performing treatments and interventions, ordering and review of laboratory studies, ordering and review of radiographic studies, pulse oximetry and re-evaluation of patient's condition.   Final Clinical Impression(s) / ED Diagnoses Final diagnoses:  Pancytopenia ([place])  Chemotherapy-induced neutropenia ([place])  Generalized weakness    Rx / DC Orders ED Discharge Orders    None       Deno Etienne, DO 07/03/20 1610

## 2020-07-03 NOTE — Progress Notes (Signed)
Patient arrived to unit at 1505. Upon assessment RN realized patient was having rigors. MD contacted. Per MD stop transfusion for 15 minutes and then restart. Transfusion stopped at 1515. [name] entered room at 1525. Per Oncologist give patient tylenol and start tylenol after. Tylenol given at 1538. At this time no rigors noted. Transfusion restarted at 1540. Transfusion completed successfully without further issues at 1612.

## 2020-07-04 ENCOUNTER — Inpatient Hospital Stay (HOSPITAL_COMMUNITY): Payer: PPO

## 2020-07-04 DIAGNOSIS — D638 Anemia in other chronic diseases classified elsewhere: Secondary | ICD-10-CM | POA: Diagnosis not present

## 2020-07-04 DIAGNOSIS — I482 Chronic atrial fibrillation, unspecified: Secondary | ICD-10-CM | POA: Diagnosis not present

## 2020-07-04 DIAGNOSIS — D701 Agranulocytosis secondary to cancer chemotherapy: Secondary | ICD-10-CM | POA: Diagnosis not present

## 2020-07-04 DIAGNOSIS — C679 Malignant neoplasm of bladder, unspecified: Secondary | ICD-10-CM | POA: Diagnosis not present

## 2020-07-04 DIAGNOSIS — N185 Chronic kidney disease, stage 5: Secondary | ICD-10-CM | POA: Diagnosis not present

## 2020-07-04 LAB — BLOOD CULTURE ID PANEL (REFLEXED) - BCID2

## 2020-07-04 LAB — BPAM PLATELET PHERESIS
Blood Product Expiration Date: 202203252359
ISSUE DATE / TIME: 202203240444
Unit Type and Rh: 6200

## 2020-07-04 LAB — PREPARE PLATELET PHERESIS: Unit division: 0

## 2020-07-04 LAB — TYPE AND SCREEN
ABO/RH(D): A POS
Antibody Screen: NEGATIVE
Unit division: 0

## 2020-07-04 LAB — C DIFFICILE QUICK SCREEN W PCR REFLEX
C Diff antigen: POSITIVE — AB
C Diff toxin: NEGATIVE

## 2020-07-04 LAB — BPAM RBC
Blood Product Expiration Date: 202204182359
ISSUE DATE / TIME: 202203241307
Unit Type and Rh: 6200

## 2020-07-04 LAB — COMPREHENSIVE METABOLIC PANEL
ALT: 26 U/L (ref 0–44)
AST: 18 U/L (ref 15–41)
Albumin: 2.6 g/dL — ABNORMAL LOW (ref 3.5–5.0)
Alkaline Phosphatase: 34 U/L — ABNORMAL LOW (ref 38–126)
Anion gap: 10 (ref 5–15)
BUN: 96 mg/dL — ABNORMAL HIGH (ref 8–23)
CO2: 17 mmol/L — ABNORMAL LOW (ref 22–32)
Calcium: 7.5 mg/dL — ABNORMAL LOW (ref 8.9–10.3)
Chloride: 102 mmol/L (ref 98–111)
Creatinine, Ser: 6.88 mg/dL — ABNORMAL HIGH (ref 0.61–1.24)
GFR, Estimated: 8 mL/min — ABNORMAL LOW (ref >60)
Glucose, Bld: 128 mg/dL — ABNORMAL HIGH (ref 70–99)
Potassium: 3.9 mmol/L (ref 3.5–5.1)
Sodium: 129 mmol/L — ABNORMAL LOW (ref 135–145)
Total Bilirubin: 0.9 mg/dL (ref 0.3–1.2)
Total Protein: 5.4 g/dL — ABNORMAL LOW (ref 6.5–8.1)

## 2020-07-04 LAB — SODIUM, URINE, RANDOM: Sodium, Ur: 70 mmol/L

## 2020-07-04 LAB — CLOSTRIDIUM DIFFICILE BY PCR, REFLEXED: Toxigenic C. Difficile by PCR: NEGATIVE

## 2020-07-04 LAB — CBC WITH DIFFERENTIAL/PLATELET
HCT: 22 % — ABNORMAL LOW (ref 39.0–52.0)
Hemoglobin: 7.5 g/dL — ABNORMAL LOW (ref 13.0–17.0)
MCH: 33 pg (ref 26.0–34.0)
MCHC: 34.1 g/dL (ref 30.0–36.0)
MCV: 96.9 fL (ref 80.0–100.0)
Platelets: 12 10*3/uL — CL (ref 150–400)
RBC: 2.27 MIL/uL — ABNORMAL LOW (ref 4.22–5.81)
RDW: 13.9 % (ref 11.5–15.5)
WBC: <0.1 10*3/uL — CL (ref 4.0–10.5)
nRBC: 0 % (ref 0.0–0.2)

## 2020-07-04 LAB — CREATININE, URINE, RANDOM: Creatinine, Urine: 44.93 mg/dL

## 2020-07-04 LAB — PHOSPHORUS: Phosphorus: 4.9 mg/dL — ABNORMAL HIGH (ref 2.5–4.6)

## 2020-07-04 LAB — MAGNESIUM: Magnesium: 1.4 mg/dL — ABNORMAL LOW (ref 1.7–2.4)

## 2020-07-04 MED ORDER — SODIUM CHLORIDE 0.9 % IV BOLUS
1000.0000 mL | Freq: Once | INTRAVENOUS | Status: AC
  Administered 2020-07-04: 1000 mL via INTRAVENOUS

## 2020-07-04 MED ORDER — SODIUM CHLORIDE 0.9% FLUSH
10.0000 mL | Freq: Two times a day (BID) | INTRAVENOUS | Status: DC
  Administered 2020-07-04 – 2020-07-15 (×14): 10 mL

## 2020-07-04 MED ORDER — MAGNESIUM SULFATE 2 GM/50ML IV SOLN
2.0000 g | Freq: Once | INTRAVENOUS | Status: AC
  Administered 2020-07-04: 2 g via INTRAVENOUS
  Filled 2020-07-04: qty 50

## 2020-07-04 MED ORDER — PROSOURCE PLUS PO LIQD
30.0000 mL | Freq: Two times a day (BID) | ORAL | Status: DC
  Administered 2020-07-04 – 2020-07-10 (×13): 30 mL via ORAL
  Filled 2020-07-04 (×12): qty 30

## 2020-07-04 MED ORDER — SODIUM CHLORIDE 0.9% FLUSH: 10.0000 mL | INTRAVENOUS | Status: DC | PRN

## 2020-07-04 MED ORDER — RESOURCE INSTANT PROTEIN PO PWD PACKET
1.0000 | Freq: Three times a day (TID) | ORAL | Status: DC
  Administered 2020-07-04 – 2020-07-09 (×15): 6 g via ORAL
  Filled 2020-07-04 (×22): qty 6

## 2020-07-04 MED ORDER — CIPROFLOXACIN IN D5W 400 MG/200ML IV SOLN
400.0000 mg | INTRAVENOUS | Status: DC
  Administered 2020-07-04 – 2020-07-05 (×2): 400 mg via INTRAVENOUS
  Filled 2020-07-04 (×2): qty 200

## 2020-07-04 NOTE — Progress Notes (Signed)
Initial Nutrition Assessment  DOCUMENTATION CODES:   Obesity unspecified  INTERVENTION:   -Beneprotein TID with meals, provides 25 kcals and 6g protein  -Prosource Plus PO BID, each provides 100 kcals and 15g protein  NUTRITION DIAGNOSIS:   Increased nutrient needs related to cancer and cancer related treatments as evidenced by estimated needs.  GOAL:   Patient will meet greater than or equal to 90% of their needs  MONITOR:   PO intake,Supplement acceptance,Labs,Weight trends,I & O's  REASON FOR ASSESSMENT:   Malnutrition Screening Tool    ASSESSMENT:   70 year old obese Caucasian male with a past medical history significant for but not limited to bladder cancer, chronic atrial fibrillation, CKD stage V, history of chronic anemia secondary to chronic kidney disease, hypertension as well as other comorbidities who presented via EMS for generalized weakness.  Patient in room with wife at bedside. Pt states this morning he ate oatmeal and fruit. States no matter what he eats he instantly has a loose BM. This has been happening for a few days now. Per pt's wife, pt's appetite had started to decrease over the past week, noticeably eating less than normal.  Pt does not like Ensure/Boost drinks. Was willing to try Beneprotein and Prosource supplements.   Per weight records, weights have been trending up since November 2021.  Medications reviewed.  Labs reviewed: Low Na Elevated Phos Low Mg  NUTRITION - FOCUSED PHYSICAL EXAM:  No depletions noted.   Diet Order:   Diet Order            Diet Heart Room service appropriate? Yes; Fluid consistency: Thin  Diet effective now                 EDUCATION NEEDS:   Education needs have been addressed  Skin:  Skin Assessment: Reviewed RN Assessment  Last BM:  3/25 -type 7  Height:   Ht Readings from Last 1 Encounters:  07/03/20 $RemoveB'6\' 2"'moTCuALM$  (1.88 m)    Weight:   Wt Readings from Last 1 Encounters:  07/03/20 116.2 kg     BMI:  Body mass index is 32.89 kg/m.  Estimated Nutritional Needs:   Kcal:  2300-2500  Protein:  120-130g  Fluid:  2.3L/day  Clayton Bibles, MS, RD, LDN Inpatient Clinical Dietitian Contact information available via Amion

## 2020-07-04 NOTE — Progress Notes (Signed)
PT Cancellation Note  Patient Details Name: [name] Rose MRN: [id number] DOB: February 28, 1951   Cancelled Treatment:    Reason Eval/Treat Not Completed: Medical issues which prohibited therapyRN reports frequent loose BM's. Will check back another time when patient can mobilize safely.   Claretha Cooper 07/04/2020, 2:13 PM [place] Pager [phone] Office [phone]

## 2020-07-04 NOTE — Progress Notes (Signed)
PROGRESS NOTE    [name] Rose  DHR:[id number] DOB: [birth year] DOA: 07/02/2020 PCP: Shirline Frees, MD   Brief Narrative:  The patient is a 70 year old obese Caucasian male with a past medical history significant for but not limited to bladder cancer, chronic atrial fibrillation, CKD stage V, history of chronic anemia secondary to chronic kidney disease, hypertension as well as other comorbidities who presented via EMS for generalized weakness.  He reportedly was at home became very weak and was not able to stand and was at his desk and was unable to get up to go to the bedroom to lay down and EMS was called.  He states that he is globally weak and did not notice any difference in the strength left right extremities.  He stated that he felt better laying down and states he been eating and drinking but reports that it is less than last few days.  He was started on new medication by his nephrologist but does not know what it was.  He is currently on chemotherapy for his bladder cancer last chemo dose was on 06/30/2020 with Ivette Loyal.  Further work-up revealed the patient was pancytopenic on admission and significantly neutropenic.  He was transfused platelets in the ED given his thrombocytopenia secondary to his mild nosebleed and hematology oncology was consulted and recommending transfusion of PRBCs now given his low blood count as well.  He is currently on anticoagulation for his chronic atrial fibrillation.  Patient's work-up has shown that he was septic secondary to a Pseudomonas bacteremia now and he has been placed on double Pseudomonas coverage with ciprofloxacin and cefepime by heme-onc but will get infectious diseases to adjust his antibiotic regimen and evaluate.  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 (HCC)   Thrombocytopenia (HCC)    Essential hypertension   Chronic atrial fibrillation (HCC)   Neutropenia (HCC)   CKD (chronic kidney disease) stage 5, GFR less than 15 ml/min (HCC)   Weakness generalized   Anemia of chronic disease  Pancytopenia secondary to antineoplastic therapy -Patient presented with a neutropenia, thrombocytopenia and anemia; see below -He was evaluated by Oncolocgy; status post day 8 of Trodelvy on 06/10/2020 and received G-CSF with chemo and they feel that his WBC will slowly recover the next 2 days -He is severely neutropenic and they feel he has an infection in the left axilla concerning for early cellulitis but due to severe neutropenia medical oncology has started the patient on IV cefepime -Patient's WBC is now <0.1, Hgb/Hct is 7.5/22.0, and Platelet Count is 12 -Oncology starting the patient on Granix  -Further care per medical oncology  Neutropenia  -[name] Rose is admitted to telemetry floor. Profound neutropenia which is due to chemotherapy but now has a Pseudomonas Bacteremia. Last chemotherapy was on 06/30/20.  -Oncology consulted and recommending Abx and feel his WBC will recover slowly in the next few days -No fever. There were No signs of infection initially but it seems like he has a Cellulitis in the Left Axilla. Blood cultures obtained in ER and will monitor. -Patient's WBC has gone from 7.8 -> 3.6 -> 0.1 and is now <0.1 Again today -Continue to Monitor and Trend -Repeat CBC in the AM  Sepsis 2/2 Pseudomonas Bacteremia and Left Axilla Cellulitis, poA -Presented with Sepsis Physiology and was Tachycardic, Tachypenic, Neutropenic and has a source of Infection int  he Blood and Cellulitis  -Patient's Blood Cx 1/4 Grew out Pseudomonas -Repeat Blood Cx -On IV Cefepime and Heme/Onc added IV Ciprofloxacin -Patient has a Port and likely will need to be removed -Given IVF with NS received a bolus yesterday and will get a bolus today;  -U/A done and showed a clear appearance with 150  glucose, large hemoglobin, 100 protein, rare bacteria, greater than 50 RBCs per high-power field and urine WBCs 0-5 -ID Consulted for further evaluation and awaiting formal evaluation  Diarrhea, poA and worsening -Has had multiple Bowel Movements -C Diff Ag + but Has Negative C Diff Toxin and Negative Toxogenic C Diff via PCR -GI Pathogen Panel pending   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 -Pt is followed by oncology and on chemotherapy. -See Above Heme/Onc following   Thrombocytopenia -Platelet count less than 20,000 with mild nose bleed and hematuria. Platelet transfusion ordered by ER.  -Platelet Count went from 115 -> 37 -> 17 -> 24 (after Transfusion) -> 12 -Followed by Heme/Onc. -Continue to Monitor for S/Sx of Bleeding -Repeat CBC in tehAM   Essential Hypertension -Stable. Pt currently off all antihypertensives.  -Continue to Monitor BP per Protocol -Last BP was 127/65  Chronic Atrial Fibrillation  -Chronic. Anticoagulation held with profound thrombocytopenia until Platelet Count is at least >50,000 -Continue to Monitor on Telemetry  AKI on CKD (chronic kidney disease) stage 5, GFR less than 15 ml/min  Metabolic Acidosis -Now worsening in the setting of Diarrhea -Patient's BUN/Cr went from 62/6.42 -> 82/6.00 -> 96/6.35 -> 96/6.88 -Patient's CO2 is 17, AG 10, and Chloride was 102 -Avoid Nephrotoxic Medications, Contrast Dyes, Hypotension and Renally Adjust Medications -Nephrology consulted for further evaluation and recommendations and patient has no uremic signs and nephrology feels that he is a little bit dry sedated on bolus another 1 L of normal saline and continue IV fluid hydration with normal saline at 75 mL's per hour -Appreciate nephrology assistance and will follow the recommendations  Hypomagnesemia -Patient's Mag Level was 1.4 -Repelte with IV Mag Sulfate 2 Grams -Continue to Monitor and Replete as Necessary -Repeat Mag Level  in the AM  Generalized Debility and Weakness  -Consult PT for evaluation.  -C/w Fall precautions.  Hyponatremia -Paitent's Na+ is 131 and dropped to 129 -Continue to Monitor and trend -Given a 1000 mL bolus and will get another  -Will start Maintenance IVF with NS at 75 mL/hr and will continue; Nephrology adjusting IVF  -Repeat CMP in the AM  Anemia of Chronic disease/Macrocytic Anemia -Patient's Hgb/Hct went from 10.2/31.1 -> 9.4/28.8 -> 8.6/25.8 -> 7.7/23.4 -> 7.5/22.0 -Heme/Onc will give 1 unit of pRBCs to keep Hgb/Hct >8 -Continue to Monitor for S/Sx of Bleeding; Had some Hematuria (U/A) and Epistaxis  -Repeat CBC in the AM  Obesity -Complicates overall prognosis and Care -Estimated body mass index is 32.89 kg/m as calculated from the following:   Height as of this encounter: $RemoveBeforeD'6\' 2"'oeouBiyOtSQlSE$  (1.88 m).   Weight as of this encounter: 116.2 kg. -Weight Loss and Dietary Counseling given  -Nutritionist consulted and recommending Beneprotein TIDwm and Prosource Plus po BID  DVT prophylaxis: SCDs given Anemia and Thrombocytopenia  Code Status: FULL CODE Family Communication: No family present at bedside  Disposition Plan: Pending further Clinical Workup   Status is: Inpatient  Remains inpatient appropriate because:Unsafe d/c plan, IV treatments appropriate due to intensity of illness or inability to take PO and Inpatient level of care appropriate due to severity of illness   Dispo: The  patient is from: Home               Anticipated d/c is to: Home              Patient currently is medically stable to d/c.   Difficult to place patient No  Consultants:   Medical Oncology  Nephrology  Infectious Diseases    Procedures: None  Antimicrobials:  Anti-infectives (From admission, onward)   Start     Dose/Rate Route Frequency Ordered Stop   07/04/20 1000  ciprofloxacin (CIPRO) IVPB 400 mg        400 mg 200 mL/hr over 60 Minutes Intravenous Every 24 hours 07/04/20 0848      07/03/20 1300  ceFEPIme (MAXIPIME) 2 g in sodium chloride 0.9 % 100 mL IVPB        2 g 200 mL/hr over 30 Minutes Intravenous Every 24 hours 07/03/20 1153          Subjective: Seen and examined at bedside and he was feeling okay but still having a lot of diarrhea.  No chest pain, lightheadedness or dizziness.  Did not really have any pain.  Denies any lightheadedness or dizziness.  No other concerns or complaints at this time.  Objective: Vitals:   07/03/20 2322 07/04/20 0350 07/04/20 1356 07/04/20 1530  BP: 111/62 139/72 120/78 127/65  Pulse: (!) 109 87 (!) 118 100  Resp: $Remo'18 14  20  'GCkDL$ Temp: 98.9 F (37.2 C) 98.3 F (36.8 C) 98.2 F (36.8 C) 98.4 F (36.9 C)  TempSrc: Oral Oral Oral Oral  SpO2: 99% 100% 100% 100%  Weight:      Height:        Intake/Output Summary (Last 24 hours) at 07/04/2020 1814 Last data filed at 07/04/2020 0938 Gross per 24 hour  Intake 759.2 ml  Output 800 ml  Net -40.8 ml   Filed Weights   07/03/20 0006  Weight: 116.2 kg   Examination: Physical Exam:  Constitutional: WN/WD obese Caucasian male currently no acute distress Eyes: Lids and conjunctivae normal, sclerae anicteric  ENMT: External Ears, Nose appear normal. Grossly normal hearing. Neck: Appears normal, supple, no cervical masses, normal ROM, no appreciable thyromegaly; no JVD Respiratory: Diminished to auscultation bilaterally, no wheezing, rales, rhonchi or crackles. Normal respiratory effort and patient is not tachypenic. No accessory muscle use.  Unlabored breathing Cardiovascular: RRR, no murmurs / rubs / gallops. S1 and S2 auscultated.  Trace extremity edema Abdomen: Soft, mildly-tender, Distended 2/2 body habitus. Bowel sounds positive.  GU: Deferred. Musculoskeletal: No clubbing / cyanosis of digits/nails. No joint deformity upper and lower extremities.  Skin: No rashes, lesions, ulcers on limited skin evaluation. No induration; Warm and dry.  Neurologic: CN 2-12 grossly intact with  no focal deficits. Romberg sign and cerebellar reflexes not assessed.  Psychiatric: Normal judgment and insight. Alert and oriented x 3. Normal mood and appropriate affect.   Data Reviewed: I have personally reviewed following labs and imaging studies  CBC: Recent Labs  Lab 06/30/20 1030 07/03/20 0009 07/03/20 0757 07/04/20 0537  WBC 3.6* 0.1* <0.1* <0.1*  NEUTROABS 2.8 0.0*  --   --   HGB 9.4* 8.6* 7.7* 7.5*  HCT 28.8* 25.8* 23.4* 22.0*  MCV 100.3* 98.5 100.4* 96.9  PLT 37* 17* 24* 12*   Basic Metabolic Panel: Recent Labs  Lab 06/30/20 1030 07/03/20 0009 07/04/20 0537  NA 131* 131* 129*  K 3.9 4.2 3.9  CL 101 104 102  CO2 19* 16* 17*  GLUCOSE 167*  109* 128*  BUN 82* 96* 96*  CREATININE 6.00* 6.35* 6.88*  CALCIUM 8.6* 7.9* 7.5*  MG 1.6*  --  1.4*  PHOS 4.4  --  4.9*   GFR: Estimated Creatinine Clearance: 13.9 mL/min (A) (by C-G formula based on SCr of 6.88 mg/dL (H)). Liver Function Tests: Recent Labs  Lab 06/30/20 1030 07/03/20 0009 07/04/20 0537  AST $Re'20 27 18  'hrH$ ALT 21 34 26  ALKPHOS 68 54 34*  BILITOT 0.6 1.0 0.9  PROT 6.6 6.3* 5.4*  ALBUMIN 3.8 3.3* 2.6*   No results for input(s): LIPASE, AMYLASE in the last 168 hours. No results for input(s): AMMONIA in the last 168 hours. Coagulation Profile: No results for input(s): INR, PROTIME in the last 168 hours. Cardiac Enzymes: No results for input(s): CKTOTAL, CKMB, CKMBINDEX, TROPONINI in the last 168 hours. BNP (last 3 results) No results for input(s): PROBNP in the last 8760 hours. HbA1C: No results for input(s): HGBA1C in the last 72 hours. CBG: No results for input(s): GLUCAP in the last 168 hours. Lipid Profile: No results for input(s): CHOL, HDL, LDLCALC, TRIG, CHOLHDL, LDLDIRECT in the last 72 hours. Thyroid Function Tests: No results for input(s): TSH, T4TOTAL, FREET4, T3FREE, THYROIDAB in the last 72 hours. Anemia Panel: No results for input(s): VITAMINB12, FOLATE, FERRITIN, TIBC, IRON,  RETICCTPCT in the last 72 hours. Sepsis Labs: Recent Labs  Lab 07/03/20 0132  LATICACIDVEN 1.8    Recent Results (from the past 240 hour(s))  Blood culture (routine x 2)     Status: None (Preliminary result)   Collection Time: 07/03/20  1:28 AM   Specimen: BLOOD  Result Value Ref Range Status   Specimen Description   Final    BLOOD RIGHT ANTECUBITAL Performed at [facility] [address] [place], [place] [address]    Special Requests   Final    BOTTLES DRAWN AEROBIC AND ANAEROBIC Blood Culture results may not be optimal due to an excessive volume of blood received in culture bottles Performed at [facility] [address] [place], [place] [address]    Culture   Final    NO GROWTH 1 DAY Performed at [facility], [place] [address], [place], [place] [address]    Report Status PENDING  Incomplete  Blood culture (routine x 2)     Status: Abnormal (Preliminary result)   Collection Time: 07/03/20  1:33 AM   Specimen: BLOOD  Result Value Ref Range Status   Specimen Description   Final    BLOOD RIGHT HAND Performed at [facility] [address], [place], [place] [address]    Special Requests   Final    BOTTLES DRAWN AEROBIC AND ANAEROBIC Blood Culture results may not be optimal due to an excessive volume of blood received in culture bottles Performed at [facility] [address], [place], [place] [address]    Culture  Setup Time   Final    AEROBIC BOTTLE ONLY GRAM NEGATIVE RODS CRITICAL RESULT CALLED TO, READ BACK BY AND VERIFIED WITH: E JACKSON PHARMD 07/04/20 0252 JDW    Culture (A)  Final    PSEUDOMONAS AERUGINOSA SUSCEPTIBILITIES TO FOLLOW Performed at [facility], [place] [address], [place], [place] [address]    Report Status PENDING  Incomplete  Resp Panel by RT-PCR (Flu A&B, Covid) Nasopharyngeal Swab     Status: None   Collection Time: 07/03/20  1:33 AM   Specimen:  Nasopharyngeal Swab; Nasopharyngeal(NP)  swabs in vial transport medium  Result Value Ref Range Status   SARS Coronavirus 2 by RT PCR NEGATIVE NEGATIVE Final    Comment: (NOTE) SARS-CoV-2 target nucleic acids are NOT DETECTED.  The SARS-CoV-2 RNA is generally detectable in upper respiratory specimens during the acute phase of infection. The lowest concentration of SARS-CoV-2 viral copies this assay can detect is 138 copies/mL. A negative result does not preclude SARS-Cov-2 infection and should not be used as the sole basis for treatment or other patient management decisions. A negative result may occur with  improper specimen collection/handling, submission of specimen other than nasopharyngeal swab, presence of viral mutation(s) within the areas targeted by this assay, and inadequate number of viral copies(<138 copies/mL). A negative result must be combined with clinical observations, patient history, and epidemiological information. The expected result is Negative.  Fact Sheet for Patients:  EntrepreneurPulse.com.au  Fact Sheet for Healthcare Providers:  IncredibleEmployment.be  This test is no t yet approved or cleared by the [place] FDA and  has been authorized for detection and/or diagnosis of SARS-CoV-2 by FDA under an Emergency Use Authorization (EUA). This EUA will remain  in effect (meaning this test can be used) for the duration of the COVID-19 declaration under Section 564(b)(1) of the Act, 21 U.S.C.section 360bbb-3(b)(1), unless the authorization is terminated  or revoked sooner.       Influenza A by PCR NEGATIVE NEGATIVE Final   Influenza B by PCR NEGATIVE NEGATIVE Final    Comment: (NOTE) The Xpert Xpress SARS-CoV-2/FLU/RSV plus assay is intended as an aid in the diagnosis of influenza from Nasopharyngeal swab specimens and should not be used as a sole basis for treatment. Nasal washings and aspirates are unacceptable for  Xpert Xpress SARS-CoV-2/FLU/RSV testing.  Fact Sheet for Patients: EntrepreneurPulse.com.au  Fact Sheet for Healthcare Providers: IncredibleEmployment.be  This test is not yet approved or cleared by the [place] FDA and has been authorized for detection and/or diagnosis of SARS-CoV-2 by FDA under an Emergency Use Authorization (EUA). This EUA will remain in effect (meaning this test can be used) for the duration of the COVID-19 declaration under Section 564(b)(1) of the Act, 21 U.S.C. section 360bbb-3(b)(1), unless the authorization is terminated or revoked.  Performed at [facility], [place] [address], [place], [name] [address]   Blood Culture ID Panel (Reflexed)     Status: Abnormal   Collection Time: 07/03/20  1:33 AM  Result Value Ref Range Status   Enterococcus faecalis NOT DETECTED NOT DETECTED Final   Enterococcus Faecium NOT DETECTED NOT DETECTED Final   Listeria monocytogenes NOT DETECTED NOT DETECTED Final   Staphylococcus species NOT DETECTED NOT DETECTED Final   Staphylococcus aureus (BCID) NOT DETECTED NOT DETECTED Final   Staphylococcus epidermidis NOT DETECTED NOT DETECTED Final   Staphylococcus lugdunensis NOT DETECTED NOT DETECTED Final   Streptococcus species NOT DETECTED NOT DETECTED Final   Streptococcus agalactiae NOT DETECTED NOT DETECTED Final   Streptococcus pneumoniae NOT DETECTED NOT DETECTED Final   Streptococcus pyogenes NOT DETECTED NOT DETECTED Final   A.calcoaceticus-baumannii NOT DETECTED NOT DETECTED Final   Bacteroides fragilis NOT DETECTED NOT DETECTED Final   Enterobacterales NOT DETECTED NOT DETECTED Final   Enterobacter cloacae complex NOT DETECTED NOT DETECTED Final   Escherichia coli NOT DETECTED NOT DETECTED Final   Klebsiella aerogenes NOT DETECTED NOT DETECTED Final   Klebsiella oxytoca NOT DETECTED NOT DETECTED Final   Klebsiella pneumoniae NOT DETECTED NOT DETECTED Final    Proteus species NOT DETECTED  NOT DETECTED Final   Salmonella species NOT DETECTED NOT DETECTED Final   Serratia marcescens NOT DETECTED NOT DETECTED Final   Haemophilus influenzae NOT DETECTED NOT DETECTED Final   Neisseria meningitidis NOT DETECTED NOT DETECTED Final   Pseudomonas aeruginosa DETECTED (A) NOT DETECTED Final    Comment: CRITICAL RESULT CALLED TO, READ BACK BY AND VERIFIED WITH: E JACKSON PHARMD 07/04/20 0252 JDW    Stenotrophomonas maltophilia NOT DETECTED NOT DETECTED Final   Candida albicans NOT DETECTED NOT DETECTED Final   Candida auris NOT DETECTED NOT DETECTED Final   Candida glabrata NOT DETECTED NOT DETECTED Final   Candida krusei NOT DETECTED NOT DETECTED Final   Candida parapsilosis NOT DETECTED NOT DETECTED Final   Candida tropicalis NOT DETECTED NOT DETECTED Final   Cryptococcus neoformans/gattii NOT DETECTED NOT DETECTED Final   CTX-M ESBL NOT DETECTED NOT DETECTED Final   Carbapenem resistance IMP NOT DETECTED NOT DETECTED Final   Carbapenem resistance KPC NOT DETECTED NOT DETECTED Final   Carbapenem resistance NDM NOT DETECTED NOT DETECTED Final   Carbapenem resistance VIM NOT DETECTED NOT DETECTED Final    Comment: Performed at [facility], [address] [place], [place] [address]  C Difficile Quick Screen w PCR reflex     Status: Abnormal   Collection Time: 07/03/20  7:01 PM   Specimen: STOOL  Result Value Ref Range Status   C Diff antigen POSITIVE (A) NEGATIVE Final   C Diff toxin NEGATIVE NEGATIVE Final   C Diff interpretation Results are indeterminate. See PCR results.  Final    Comment: Performed at [facility], [place] [address] [place], [place] [address]  C. Diff by PCR, Reflexed     Status: None   Collection Time: 07/03/20  7:01 PM  Result Value Ref Range Status   Toxigenic C. Difficile by PCR NEGATIVE NEGATIVE Final    Comment: Patient is colonized with non toxigenic C. difficile. May not need treatment unless  significant symptoms are present. Performed at [facility], [place] [address], [place], [place] [address]      RN Pressure Injury Documentation:     Estimated body mass index is 32.89 kg/m as calculated from the following:   Height as of this encounter: $RemoveBeforeD'6\' 2"'PMsWasJIvgPRUl$  (1.88 m).   Weight as of this encounter: 116.2 kg.  Malnutrition Type:  Nutrition Problem: Increased nutrient needs Etiology: cancer and cancer related treatments   Malnutrition Characteristics:  Signs/Symptoms: estimated needs  Nutrition Interventions:  Interventions: Prostat,Refer to RD note for recommendations   Radiology Studies: US RENAL  Result Date: 07/04/2020 CLINICAL DATA:  Acute renal failure superimposed on stage 5 chronic kidney disease. History of bladder tumor post change urethral resection. EXAM: RENAL / URINARY TRACT ULTRASOUND COMPLETE COMPARISON:  Head CT 06/04/2020 FINDINGS: Right Kidney: Renal measurements: 11.3 x 4.8 x 5.5 cm = volume: 156 mL. Thinning of the renal parenchyma with increased echogenicity. No hydronephrosis. No evidence of focal lesion. Left Kidney: Renal measurements: 12.9 x 5.5 x 5.0 cm = volume: 171 mL. Thinning of the renal parenchyma with increased echogenicity. No hydronephrosis. No evidence of focal renal lesion. Bladder: Physiologically distended. Area of slightly lobulated echogenic bladder wall thickening is seen about the right bladder wall. No vascularity. Other: None. IMPRESSION: 1. No obstructive uropathy. 2. Bilateral renal parenchymal thinning and increased echogenicity consistent with chronic medical renal disease. 3. Echogenic bladder wall thickening in the right bladder, corresponding to that seen on prior PET CT, at site of  hypermetabolism prior PET. Electronically Signed   By: Keith Rake M.D.   On: 07/04/2020 17:36   DG Chest Port 1 View  Result Date: 07/03/2020 CLINICAL DATA:  Weakness EXAM: PORTABLE CHEST 1 VIEW COMPARISON:  September 24, 2018 FINDINGS: The heart  size and mediastinal contours are mildly enlarged. A right-sided MediPort catheter seen with the tip at the superior cavoatrial junction. Both lungs are clear. The visualized skeletal structures are unremarkable. IMPRESSION: No active disease. Electronically Signed   By: Prudencio Pair M.D.   On: 07/03/2020 01:44   DG Abd Portable 2V  Result Date: 07/04/2020 CLINICAL DATA:  Pseudomonas, diarrhea. EXAM: PORTABLE ABDOMEN - 2 VIEW COMPARISON:  February 12, 2020. FINDINGS: The bowel gas pattern is normal. There is no evidence of free air. No radio-opaque calculi or other significant radiographic abnormality is seen. IMPRESSION: Negative. Electronically Signed   By: Marijo Conception M.D.   On: 07/04/2020 08:54   Scheduled Meds: . (feeding supplement) PROSource Plus  30 mL Oral BID BM  . sodium chloride   Intravenous Once  . Chlorhexidine Gluconate Cloth  6 each Topical Daily  . DULoxetine  60 mg Oral Daily  . protein supplement  1 Scoop Oral TID WC  . sodium chloride flush  10-40 mL Intracatheter Q12H  . Tbo-Filgrastim  480 mcg Subcutaneous q1800   Continuous Infusions: . sodium chloride 75 mL/hr at 07/04/20 1012  . ceFEPime (MAXIPIME) IV 2 g (07/04/20 1309)  . ciprofloxacin 400 mg (07/04/20 1013)  . sodium chloride      LOS: 1 day   Kerney Elbe, DO [facility] PAGER is on AMION  If 7PM-7AM, please contact night-coverage [email or web address]

## 2020-07-04 NOTE — Consult Note (Signed)
Renal Service Consult Note [facility]  [name] Rose 07/04/2020 [name], MD Requesting Physician: Dr Alfredia Ferguson  Reason for Consult: Renal failure HPI: The patient is a 70 y.o. year-old w/ hx of chronic atrial fib, HL, HTN, hx prostate Ca, h/o bladder cancer and CKD V presented yesterday w/ gen'd weakness, not able to get up to bathroom at home. Po intake down the last efw days. Getting chemoRx still for his bladder Ca, last was 06/30/20. In ED labs showed neutropenia, severe. WBC was 0.2.  Blood cx's were obtained and pt started on empiric IV abx. Plts very low about 12K.  Creat close to baseline at 6.3 yest and 6.8 today.  Asked to see for renal failure.     Pt seen in room, wife at bedside. Having hiccups, L chest wall pain / inflammation. No abd pain or N/V at this time, and no confusion or jerking.    Admits prior:  Oct 2021 - admitted for neutropenic sepsis , grew Klebs pna. On chemoRx for bladder cancer w/ mets. Sepsis responded to IV abx. +AKI on CKD IV.  EGD/ CE negative, continued on PPI. WBC came up.    June 2021 - admit for persistent atrial fib , rx'd w/ Tikosyn loading and DCCV x 1 which was successful.     July 2020- admitted for PNA rx'd w/ IV abx.  AKI on CKD 3. Had brief HD via temp cath, removed prior to dc. Dc'd to SNF due to debility.   ROS  denies CP  no joint pain   no HA  no blurry vision  no rash  no diarrhea  no nausea/ vomiting  no dysuria  no difficulty voiding  no change in urine color    Past Medical History  Past Medical History:  Diagnosis Date  . Anemia   . Arthritis   . 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 ([place]) 09/29/2016  . Bladder tumor   . Chronic renal insufficiency    stage IV  . Dyspnea    with exertion  . Essential hypertension 06/23/2018  . Goals of care, counseling/discussion 09/30/2016  . History of prostate cancer followed by pcp dr [name] Rose-  per pt last PSA undetectable   dx 2008--  (Stage T1c, Gleason 3+3,  PSA 4.58, vol 99cc)  s/p  radical prostatectomy (nerve sparing bilateral)   . Hyperglycemia 06/23/2018  . Hypertension   . Mild hyperlipidemia 06/23/2018  . Persistent atrial fibrillation (HCC)    atrial fib  . Pre-diabetes   . Wears glasses    Past Surgical History  Past Surgical History:  Procedure Laterality Date  . CARDIOVERSION N/A 05/18/2019   Procedure: CARDIOVERSION;  Surgeon: [name] Perla, MD;  Location: [facility] ENDOSCOPY;  Service: Cardiovascular;  Laterality: N/A;  . CARDIOVERSION N/A 09/27/2019   Procedure: CARDIOVERSION;  Surgeon: [name] Heinz, MD;  Location: [place];  Service: Cardiovascular;  Laterality: N/A;  . CATARACT EXTRACTION W/ INTRAOCULAR LENS  IMPLANT, BILATERAL Bilateral 2011  . [place]  . ESOPHAGOGASTRODUODENOSCOPY N/A 02/19/2020   Procedure: ESOPHAGOGASTRODUODENOSCOPY (EGD);  Surgeon: [name] Corner, MD;  Location: [place];  Service: Gastroenterology;  Laterality: N/A;  . GIVENS CAPSULE STUDY N/A 02/19/2020   Procedure: GIVENS CAPSULE STUDY;  Surgeon: [name] Corner, MD;  Location: [place];  Service: Gastroenterology;  Laterality: N/A;  . IR FLUORO GUIDE PORT INSERTION RIGHT  10/07/2016  . IR RADIOLOGIST EVAL & MGMT  01/05/2018  . IR US GUIDE VASC ACCESS  RIGHT  10/07/2016  . KNEE ARTHROSCOPY Bilateral right 2006;  left 02-15-2007  . [place];  1990;  1983  . ROBOT ASSISTED LAPAROSCOPIC RADICAL PROSTATECTOMY  06/20/2006   bilateral nerve sparing  . TEE WITHOUT CARDIOVERSION N/A 06/27/2018   Procedure: TRANSESOPHAGEAL ECHOCARDIOGRAM (TEE);  Surgeon: [name] Mormon, MD;  Location: [facility] ENDOSCOPY;  Service: Cardiovascular;  Laterality: N/A;  . TOTAL HIP ARTHROPLASTY Left 03/03/2015   Procedure: LEFT TOTAL HIP ARTHROPLASTY ANTERIOR APPROACH;  Surgeon: [name] Leitz, MD;  Location: [place];  Service: Orthopedics;  Laterality: Left;  . TOTAL KNEE ARTHROPLASTY Bilateral left  08-13-2009;  right 12-26-2009  . TRANSURETHRAL RESECTION OF BLADDER TUMOR N/A 09/20/2016   Procedure: TRANSURETHRAL RESECTION OF BLADDER TUMOR (TURBT);  Surgeon: [name] Gallo, MD;  Location: [facility];  Service: Urology;  Laterality: N/A;  . TRANSURETHRAL RESECTION OF BLADDER TUMOR WITH MITOMYCIN-C N/A 08/06/2019   Procedure: TRANSURETHRAL RESECTION OF BLADDER TUMOR;  Surgeon: [name] Gallo, MD;  Location: [facility];  Service: Urology;  Laterality: N/A;   Family History  Family History  Problem Relation Age of Onset  . Hypertension Mother   . Aneurysm Mother   . Emphysema Father   . Hypertension Father    Social History  reports that he quit smoking about 35 years ago. His smoking use included cigarettes. He has a 16.00 pack-year smoking history. He has never used smokeless tobacco. He reports current alcohol use. He reports current drug use. Drug: Marijuana. Allergies No Known Allergies Home medications Prior to Admission medications   Medication Sig Start Date End Date Taking? Authorizing Provider  acetaminophen (TYLENOL) 500 MG tablet Take 1,000 mg by mouth every 6 (six) hours as needed for mild pain or headache.   Yes [provider]  amiodarone (PACERONE) 200 MG tablet Take 1 tablet (200 mg total) by mouth daily. 05/28/20  Yes [name] Needs, NP  apixaban (ELIQUIS) 5 MG TABS tablet Take 1 tablet (5 mg total) by mouth 2 (two) times daily. 06/12/20  Yes [name] Needs, NP  atorvastatin (LIPITOR) 40 MG tablet Take 40 mg by mouth daily.   Yes [provider]  cyclobenzaprine (FEXMID) 7.5 MG tablet Take 1 tablet (7.5 mg total) by mouth 3 (three) times daily as needed for muscle spasms. 12/31/19  Yes Ennever, Rudell Cobb, MD  DULoxetine (CYMBALTA) 60 MG capsule TAKE ONE CAPSULE BY MOUTH DAILY Patient taking differently: Take 60 mg by mouth daily. 06/10/20  Yes [name] Napoleon, MD  famotidine (PEPCID) 20 MG tablet Take 20 mg by mouth 2  (two) times daily.   Yes [provider]  furosemide (LASIX) 40 MG tablet Take 40 mg by mouth See admin instructions. 73m on MondayWednesday and Friday 06/26/20  Yes [provider]  lidocaine-prilocaine (EMLA) cream Apply 1 application topically as needed (port access).   Yes [provider]  loperamide (IMODIUM A-D) 2 MG tablet Take 2 at diarrhea onset, then 1 every 2hr until 12hrs with no BM. May take 2 every 4hrs at night. If diarrhea recurs repeat. Patient taking differently: Take 2 mg by mouth as needed for diarrhea or loose stools. Take 2 at diarrhea onset, then 1 every 2hr until 12hrs with no BM. May take 2 every 4hrs at night. If diarrhea recurs repeat. 12/31/19  Yes EVolanda Napoleon MD  magnesium oxide (MAG-OX) 400 MG tablet Take 1 tablet (400 mg total) by mouth 2 (two) times daily. 10/05/19  Yes CSherran Needs  NP  metoprolol tartrate (LOPRESSOR) 25 MG tablet Take 25 mg by mouth 2 (two) times daily.   Yes [provider]  oxybutynin (DITROPAN-XL) 10 MG 24 hr tablet Take 10 mg by mouth at bedtime.   Yes [provider]  potassium chloride (KLOR-CON) 10 MEQ tablet Take 2 tablets (20 mEq total) by mouth daily. 03/31/20  Yes Allred, Jeneen Rinks, MD  prochlorperazine (COMPAZINE) 10 MG tablet Take 1 tablet (10 mg total) by mouth every 6 (six) hours as needed (Nausea or vomiting). 12/31/19  Yes [name] Napoleon, MD  pyridoxine (B-6) 100 MG tablet Take 200 mg by mouth daily with breakfast.    Yes [provider]  sodium bicarbonate 650 MG tablet Take 2 tablets (1,300 mg total) by mouth 2 (two) times daily. 02/25/20  Yes Oretha Milch D, MD  Vitamin D, Ergocalciferol, (DRISDOL) 1.25 MG (50000 UNIT) CAPS capsule Take 50,000 Units by mouth once a week. 04/15/20  Yes [provider]     Vitals:   07/03/20 1612 07/03/20 1958 07/03/20 2322 07/04/20 0350  BP: 125/79 (!) 112/58 111/62 139/72  Pulse: 94 74 (!) 109 87  Resp: '16 14 18 14  ' Temp:  98.2 F (36.8 C) 98.9 F (37.2 C) 98.9 F (37.2 C) 98.3 F (36.8 C)  TempSrc: Oral Oral Oral Oral  SpO2: 94% 100% 99% 100%  Weight:      Height:       Exam: Gen alert, nad No rash, cyanosis or gangrene Sclera anicteric, throat clear  No jvd or bruits Chest clear bilat to bases RRR no MRG Abd soft ntnd no mass or ascites +bs GU normal male condom cath  MS no joint effusions or deformity Ext no LE or UE edema Livedo skin pattern UE/ LE's Neuro is alert, Ox 3 , nf , no asterixis    Date    Creat   eGFR   2011- 2016  0.7- 1.3   2018    0.9- 1.6   2019   1.0- 1.6   Jan -May 2020 1.40- 2.30   Late 2020  1.40- 1.80   AKI 4.5 > 1.66, AKI 4.0 > 2.60   Jan -Sept 2021 1.9- 2.5   Oct -Dec 2021 2.4- 4.6 AKI 9.8 > 4.6 at dc   Feb 2022  5.85   March 7  6.27   March 14  6.42   March 21  6.00   March 24  6.35   July 05, 2019 6.88     Home meds:  - amiodroane 200 qd/ eliquis 5 bid/ lipitor 40/ lasix 40 q mwf/ metoprolol 25 bid/ klor-con 20 qd/ sod bicarb 1300 bid  - pepcid 20 bid  - prn's/ vitamins/ supplements    Na 129  K 3.9  CO 2 17  AG 10  BUN 96  Cr 6.88  Ca 7.5  Alb 2.6  eGFR 8   WBC <0.1, Hb 7.5 plt 12 K    UA 3/23 - clear 150 glu, large Hb, 100 prot, rare bact, >50 rbc, 0-5 wbc   cdif Ag +, toxin neg   CXR - IMPRESSION: No active disease.    BCID +pseudomonas aeruginosa / blood cx's 1 of 2 + for pseudomonas    BP's wnl 120/80 - 140/70, HR 63- 118, RR 14-21 temp 98.9 max  Assessment/ Plan: 1. AKI on CKD V - baseline creat 5.8- 6.2.  Here w/ gram-neg sepsis and neutropenia related to chemoRx. BP's okay. No uremic signs/ symptoms.  Looks possibly a bit dry. Will bolus another 1 liter, cont IVF"s at 75 cc/hr.  No indication for RRT yet. Will follow.  2. Neutropenic fevers - +pseudomonas bacteremia. Per pmd, on IV abx 3. Bladder cancer - per ONC, has been getting active chemoRx 4. Atrial fib - takes eliquis and amio/ BB at home 5. Anemia - d/t chemoRx + CKD. Tranfuse prn.   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

## 2020-07-04 NOTE — Progress Notes (Signed)
As expected, [name] is growing gram-negative rods in his blood.  Unfortunately, this is Pseudomonas.  He is markedly neutropenic.  His white cell count still less than 0.1.  His hemoglobin 7.7.  Platelet count is 12,000.  BUN 96 creatinine 6.88.  His albumin is 2.6.  I really am distressed by this.  He is having a lot of diarrhea.  I am sure that the Pseudomonas is probably coming from this diarrhea.  We have on Neupogen.  I think his prognosis will clearly be tied to his white cell count recovery.  I do not know what his white cell count will recover.  I suspect he might be in about 3 or 4 days.  He is on Maxipime and vancomycin.  He probably needs to be switched over to aztreonam with Maxipime.  I think he needs double coverage for the Pseudomonas for right now.  He actually feels pretty good.  The cellulitis in the left lateral chest wall seems to be less prominent.  I just worry about his renal function deteriorating. He is still making urine.  He is not uremic.  He is not eating much.  I think this because of the diarrhea that he has.  His vital signs are temperature of 98.3.  Pulse 87.  Blood pressure 139/72.  His abdomen is quite silent.  There is no distention.  There is no guarding or rebound tenderness.  Lungs are relatively clear.  Cardiac exam regular rate and rhythm consistent with his atrial fibrillation.  Neurological exam is nonfocal.  We are now dealing with Pseudomonas bacteremia.  I really hate this for him.  He had Klebsiella in his blood back in October.  I just doubt that he is going to receive any further chemotherapy.  Again we cut the dose of the Trodelvy down by 50% and yet he still is profoundly neutropenic.  I know that he is getting wonderful care by all the staff on [address].  [name], MD  Nelly Rout

## 2020-07-04 NOTE — Progress Notes (Signed)
PHARMACY - PHYSICIAN COMMUNICATION CRITICAL VALUE ALERT - BLOOD CULTURE IDENTIFICATION (BCID)  [name] Rose is an 70 y.o. male who presented to [facility] on 07/02/2020 with a chief complaint of weakness, hx of bladder cancer on chemo  Assessment: 1/4 aerobic GNR, pseudomonas, noR  Name of physician (or Provider) Contacted: [place]  Current antibiotics: cefepime  Changes to prescribed antibiotics recommended:  none  Results for orders placed or performed during the hospital encounter of 07/02/20  Blood Culture ID Panel (Reflexed) (Collected: 07/03/2020  1:33 AM)  Result Value Ref Range   Enterococcus faecalis NOT DETECTED NOT DETECTED   Enterococcus Faecium NOT DETECTED NOT DETECTED   Listeria monocytogenes NOT DETECTED NOT DETECTED   Staphylococcus species NOT DETECTED NOT DETECTED   Staphylococcus aureus (BCID) NOT DETECTED NOT DETECTED   Staphylococcus epidermidis NOT DETECTED NOT DETECTED   Staphylococcus lugdunensis NOT DETECTED NOT DETECTED   Streptococcus species NOT DETECTED NOT DETECTED   Streptococcus agalactiae NOT DETECTED NOT DETECTED   Streptococcus pneumoniae NOT DETECTED NOT DETECTED   Streptococcus pyogenes NOT DETECTED NOT DETECTED   A.calcoaceticus-baumannii NOT DETECTED NOT DETECTED   Bacteroides fragilis NOT DETECTED NOT DETECTED   Enterobacterales NOT DETECTED NOT DETECTED   Enterobacter cloacae complex NOT DETECTED NOT DETECTED   Escherichia coli NOT DETECTED NOT DETECTED   Klebsiella aerogenes NOT DETECTED NOT DETECTED   Klebsiella oxytoca NOT DETECTED NOT DETECTED   Klebsiella pneumoniae NOT DETECTED NOT DETECTED   Proteus species NOT DETECTED NOT DETECTED   Salmonella species NOT DETECTED NOT DETECTED   Serratia marcescens NOT DETECTED NOT DETECTED   Haemophilus influenzae NOT DETECTED NOT DETECTED   Neisseria meningitidis NOT DETECTED NOT DETECTED   Pseudomonas aeruginosa DETECTED (A) NOT DETECTED   Stenotrophomonas maltophilia NOT DETECTED NOT  DETECTED   Candida albicans NOT DETECTED NOT DETECTED   Candida auris NOT DETECTED NOT DETECTED   Candida glabrata NOT DETECTED NOT DETECTED   Candida krusei NOT DETECTED NOT DETECTED   Candida parapsilosis NOT DETECTED NOT DETECTED   Candida tropicalis NOT DETECTED NOT DETECTED   Cryptococcus neoformans/gattii NOT DETECTED NOT DETECTED   CTX-M ESBL NOT DETECTED NOT DETECTED   Carbapenem resistance IMP NOT DETECTED NOT DETECTED   Carbapenem resistance KPC NOT DETECTED NOT DETECTED   Carbapenem resistance NDM NOT DETECTED NOT DETECTED   Carbapenem resistance VIM NOT DETECTED NOT DETECTED    Dolly Rias RPh 07/04/2020, 2:58 AM

## 2020-07-04 NOTE — Progress Notes (Signed)
Pharmacy Antibiotic Note  [name] Rose is a 70 y.o. male with a h/o bladder cancer who received  chemo 3/21 admitted on 07/02/2020 with weakness. Patient is pancytopenic with ANC 0.0. Now found to have pseudomonas in 1/4 blood cultures. Oncology would like to double cover the pseudomonas for now so will add on ciprofloxacin to cefepime. Noted that patient does have an implanted port. Mr. Grunder does have a history of CKD V with current Scr 6.88 so will adjust antibiotic doses accordingly.   Plan: Ciprofloxacin 400 mg IV Q 24 hours  Cefepime 2 g iv q 24 hours F/U cultures, susceptibilities, renal function and clinical status  Height: $Remove'6\' 2"'[id number]$  (188 cm) Weight: 116.2 kg (256 lb 2.8 oz) IBW/kg (Calculated) : 82.2  Temp (24hrs), Avg:98.3 F (36.8 C), Min:98 F (36.7 C), Max:98.9 F (37.2 C)  Recent Labs  Lab 06/30/20 1030 07/03/20 0009 07/03/20 0132 07/03/20 0757 07/04/20 0537  WBC 3.6* 0.1*  --  <0.1* <0.1*  CREATININE 6.00* 6.35*  --   --  6.88*  LATICACIDVEN  --   --  1.8  --   --     Estimated Creatinine Clearance: 13.9 mL/min (A) (by C-G formula based on SCr of 6.88 mg/dL (H)).    No Known Allergies  Antimicrobials this admission: 3/24 cefepime >>  3/25 Cipro>  Dose adjustments this admission:  Microbiology results: 3/24 BCx: 1/4 pseudomonas aeruginosa 3/24 Resp panel: covid neg, flu neg  Thank you for allowing pharmacy to be a part of this patient's care.  Jimmy Footman, PharmD, BCPS, BCIDP Infectious Diseases Clinical Pharmacist Phone: [phone] 07/04/2020 8:36 AM

## 2020-07-05 DIAGNOSIS — C772 Secondary and unspecified malignant neoplasm of intra-abdominal lymph nodes: Secondary | ICD-10-CM

## 2020-07-05 DIAGNOSIS — N185 Chronic kidney disease, stage 5: Secondary | ICD-10-CM | POA: Diagnosis not present

## 2020-07-05 DIAGNOSIS — A498 Other bacterial infections of unspecified site: Secondary | ICD-10-CM

## 2020-07-05 DIAGNOSIS — D701 Agranulocytosis secondary to cancer chemotherapy: Secondary | ICD-10-CM

## 2020-07-05 DIAGNOSIS — C679 Malignant neoplasm of bladder, unspecified: Secondary | ICD-10-CM

## 2020-07-05 DIAGNOSIS — D638 Anemia in other chronic diseases classified elsewhere: Secondary | ICD-10-CM | POA: Diagnosis not present

## 2020-07-05 DIAGNOSIS — I482 Chronic atrial fibrillation, unspecified: Secondary | ICD-10-CM | POA: Diagnosis not present

## 2020-07-05 DIAGNOSIS — R7881 Bacteremia: Secondary | ICD-10-CM

## 2020-07-05 LAB — CBC WITH DIFFERENTIAL/PLATELET
HCT: 24.6 % — ABNORMAL LOW (ref 39.0–52.0)
Hemoglobin: 8.3 g/dL — ABNORMAL LOW (ref 13.0–17.0)
MCH: 32.9 pg (ref 26.0–34.0)
MCHC: 33.7 g/dL (ref 30.0–36.0)
MCV: 97.6 fL (ref 80.0–100.0)
Platelets: 9 10*3/uL — CL (ref 150–400)
RBC: 2.52 MIL/uL — ABNORMAL LOW (ref 4.22–5.81)
RDW: 13.6 % (ref 11.5–15.5)
WBC: <0.1 10*3/uL — CL (ref 4.0–10.5)
nRBC: 0 % (ref 0.0–0.2)

## 2020-07-05 LAB — GASTROINTESTINAL PANEL BY PCR, STOOL (REPLACES STOOL CULTURE)

## 2020-07-05 LAB — CULTURE, BLOOD (ROUTINE X 2)

## 2020-07-05 LAB — MAGNESIUM
Magnesium: 1.7 mg/dL (ref 1.7–2.4)
Magnesium: 1.7 mg/dL (ref 1.7–2.4)

## 2020-07-05 LAB — COMPREHENSIVE METABOLIC PANEL
ALT: 22 U/L (ref 0–44)
ALT: 23 U/L (ref 0–44)
AST: 15 U/L (ref 15–41)
AST: 15 U/L (ref 15–41)
Albumin: 2.5 g/dL — ABNORMAL LOW (ref 3.5–5.0)
Albumin: 2.5 g/dL — ABNORMAL LOW (ref 3.5–5.0)
Alkaline Phosphatase: 32 U/L — ABNORMAL LOW (ref 38–126)
Alkaline Phosphatase: 32 U/L — ABNORMAL LOW (ref 38–126)
Anion gap: 10 (ref 5–15)
Anion gap: 12 (ref 5–15)
BUN: 86 mg/dL — ABNORMAL HIGH (ref 8–23)
BUN: 89 mg/dL — ABNORMAL HIGH (ref 8–23)
CO2: 13 mmol/L — ABNORMAL LOW (ref 22–32)
CO2: 14 mmol/L — ABNORMAL LOW (ref 22–32)
Calcium: 7.7 mg/dL — ABNORMAL LOW (ref 8.9–10.3)
Calcium: 7.7 mg/dL — ABNORMAL LOW (ref 8.9–10.3)
Chloride: 103 mmol/L (ref 98–111)
Chloride: 104 mmol/L (ref 98–111)
Creatinine, Ser: 5.91 mg/dL — ABNORMAL HIGH (ref 0.61–1.24)
Creatinine, Ser: 6.77 mg/dL — ABNORMAL HIGH (ref 0.61–1.24)
GFR, Estimated: 10 mL/min — ABNORMAL LOW (ref >60)
GFR, Estimated: 8 mL/min — ABNORMAL LOW (ref >60)
Glucose, Bld: 158 mg/dL — ABNORMAL HIGH (ref 70–99)
Glucose, Bld: 162 mg/dL — ABNORMAL HIGH (ref 70–99)
Potassium: 3.2 mmol/L — ABNORMAL LOW (ref 3.5–5.1)
Potassium: 3.2 mmol/L — ABNORMAL LOW (ref 3.5–5.1)
Sodium: 127 mmol/L — ABNORMAL LOW (ref 135–145)
Sodium: 129 mmol/L — ABNORMAL LOW (ref 135–145)
Total Bilirubin: 0.7 mg/dL (ref 0.3–1.2)
Total Bilirubin: 1 mg/dL (ref 0.3–1.2)
Total Protein: 5.4 g/dL — ABNORMAL LOW (ref 6.5–8.1)
Total Protein: 5.4 g/dL — ABNORMAL LOW (ref 6.5–8.1)

## 2020-07-05 LAB — PHOSPHORUS
Phosphorus: 4.1 mg/dL (ref 2.5–4.6)
Phosphorus: 4.2 mg/dL (ref 2.5–4.6)

## 2020-07-05 MED ORDER — MAGNESIUM SULFATE 2 GM/50ML IV SOLN
2.0000 g | Freq: Once | INTRAVENOUS | Status: AC
  Administered 2020-07-05: 2 g via INTRAVENOUS
  Filled 2020-07-05: qty 50

## 2020-07-05 MED ORDER — POTASSIUM CHLORIDE CRYS ER 20 MEQ PO TBCR
40.0000 meq | EXTENDED_RELEASE_TABLET | Freq: Once | ORAL | Status: AC
  Administered 2020-07-05: 40 meq via ORAL
  Filled 2020-07-05: qty 2

## 2020-07-05 MED ORDER — SODIUM CHLORIDE 0.9 % IV BOLUS
1000.0000 mL | Freq: Once | INTRAVENOUS | Status: AC
  Administered 2020-07-05: 1000 mL via INTRAVENOUS

## 2020-07-05 NOTE — Progress Notes (Signed)
PROGRESS NOTE    [name] Rose  AYO:[id number] DOB: 07/04/1950 DOA: 07/02/2020 PCP: Shirline Frees, MD   Brief Narrative:  The patient is a 70 year old obese Caucasian male with a past medical history significant for but not limited to bladder cancer, chronic atrial fibrillation, CKD stage V, history of chronic anemia secondary to chronic kidney disease, hypertension as well as other comorbidities who presented via EMS for generalized weakness.  He reportedly was at home became very weak and was not able to stand and was at his desk and was unable to get up to go to the bedroom to lay down and EMS was called.  He states that he is globally weak and did not notice any difference in the strength left right extremities.  He stated that he felt better laying down and states he been eating and drinking but reports that it is less than last few days.  He was started on new medication by his nephrologist but does not know what it was.  He is currently on chemotherapy for his bladder cancer last chemo dose was on 06/30/2020 with Ivette Loyal.  Further work-up revealed the patient was pancytopenic on admission and significantly neutropenic.  He was transfused platelets in the ED given his thrombocytopenia secondary to his mild nosebleed and hematology oncology was consulted and recommending transfusion of PRBCs now given his low blood count as well.  He is currently on anticoagulation for his chronic atrial fibrillation.  Patient's work-up has shown that he was septic secondary to a Pseudomonas bacteremia now and he has been placed on double Pseudomonas coverage with ciprofloxacin and cefepime by heme-onc but will get infectious diseases to adjust his antibiotic regimen and evaluate.  Nephrology was also consulted given his worsening renal function in the setting of his diarrhea.  His renal function is improving with IV fluid hydration.  His white blood cell count continues to be extremely low and his platelet count has  dropped to 9 now but he continues to receive G-CSF.  His metabolic acidosis is worsening so will defer to nephrology for assistance.  He was found to have nontoxigenic C. difficile consistent with colonization in the setting of his diarrhea.  Infectious diseases has narrowed his antibiotics just to cefepime now and recommending having his Port-A-Cath removed.   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 (HCC)   Thrombocytopenia (HCC)   Essential hypertension   Chronic atrial fibrillation (HCC)   Neutropenia (HCC)   CKD (chronic kidney disease) stage 5, GFR less than 15 ml/min (HCC)   Weakness generalized   Anemia of chronic disease  Pancytopenia secondary to antineoplastic therapy -Patient presented with a neutropenia, thrombocytopenia and anemia; see below -He was evaluated by Oncolocgy; status post day 8 of Trodelvy on 06/10/2020 and received G-CSF with chemo and they feel that his WBC will slowly recover the next 2 days -He is severely neutropenic and they feel he has an infection in the left axilla concerning for early cellulitis but due to severe neutropenia medical oncology has started the patient on IV cefepime -Patient's WBC is now <0.1, Hgb/Hct is slightly improved to 8.3/24.6, and Platelet Count is 9 -Oncology starting the patient on Granix  -Further care per medical oncology -Continues to get Tbo-Filgrastim 480 mcg   Neutropenia  -Mr. Slight is admitted to telemetry floor. Profound neutropenia which is due to chemotherapy but now has a Pseudomonas Bacteremia. Last chemotherapy was on 06/30/20.  -Oncology consulted and  recommending Abx and feel his WBC will recover slowly in the next few days -No fever. There were No signs of infection initially but it seems like he has a Cellulitis in the Left Axilla. Blood cultures obtained in ER and will monitor. -Patient's WBC has gone from 7.8 -> 3.6 -> 0.1 and is now <0.1 Again today  for the 3rd day in a row -Continue to Monitor and Trend -Repeat CBC in the AM  Sepsis 2/2 Pseudomonas Bacteremia and Left Axilla Cellulitis, poA -Presented with Sepsis Physiology and was Tachycardic, Tachypenic, Neutropenic and has a source of Infection int he Blood and Cellulitis  -Patient's Blood Cx 1/4 Grew out Pseudomonas -Repeat Blood Cx -On IV Cefepime and Heme/Onc added IV Ciprofloxacin; ID has narrowed just to Cefepime -Patient has a Port and likely will need to be removed -Given IVF with NS received a bolus yesterday and will get a bolus today;  -U/A done and showed a clear appearance with 150 glucose, large hemoglobin, 100 protein, rare bacteria, greater than 50 RBCs per high-power field and urine WBCs 0-5 -ID Consulted for further evaluation I appreciate Dr. Storm Frisk evaluation; ID recommends removal of Port-A-Cath and treating with IV Cefepime monotherapy  -General Surgery consulted for further Evaluation for Removal of Port-A-Cath  Diarrhea, poA and worsening -Has had multiple Bowel Movements -C Diff Ag + but Has Negative C Diff Toxin and Negative Toxogenic C Diff via PCR -GI Pathogen Panel Negative -Patient has nontoxigenic C. difficile and is colonization and ID recommending treating if only becomes worsened  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 -Pt is followed by oncology and on chemotherapy. -See Above Heme/Onc following   Thrombocytopenia -Platelet count less than 20,000 with mild nose bleed and hematuria. Platelet transfusion ordered by ER.  -Platelet Count went from 115 -> 37 -> 17 -> 24 (after Transfusion) -> 12 -> 9 -Followed by Heme/Onc. -Continue to Monitor for S/Sx of Bleeding -Repeat CBC in tehAM   Essential Hypertension -Stable. Pt currently off all antihypertensives.  -Continue to Monitor BP per Protocol -Last BP was 114/63  Chronic Atrial Fibrillation  -Chronic. Anticoagulation held with profound thrombocytopenia until  Platelet Count is at least >50,000 -Continue to Monitor on Telemetry  AKI on CKD (chronic kidney disease) stage 5, GFR less than 15 ml/min  Metabolic Acidosis, worsening  -Now worsening in the setting of Diarrhea -Patient's BUN/Cr went from 62/6.42 -> 82/6.00 -> 96/6.35 -> 96/6.88 -> 86/5.91 -Patient's Acidosis is worsening and now CO2 is 13, AG is 10, Chlorde Level is 103 -Avoid Nephrotoxic Medications, Contrast Dyes, Hypotension and Renally Adjust Medications -Nephrology consulted for further evaluation and recommendations and patient has no uremic signs and nephrology feels that he is a little bit dry; Fluid Management per Nephrology Recc's  -Appreciate nephrology assistance and will follow the recommendations  Hypomagnesemia -Patient's Mag Level was 1.4 and improved to 1.7 -Repelte with IV Mag Sulfate 2 Grams -Continue to Monitor and Replete as Necessary -Repeat Mag Level in the AM  Hypokalemia -Patient's K+ was 3.2 this AM -Replete with po KCl 40 mEQ x1 -Continue to Monitor and Replete as Necessary -Repeat CMP in the AM   Generalized Debility and Weakness  -Consult PT for evaluation.  -C/w Fall precautions.  Hyponatremia -Paitent's Na+ is 131 and dropped to 129 and is now 127 this AM  -Continue to Monitor and trend -Given a 1000 mL bolus and will get another today per nephrology  -Nephrology adjusting IVF  -Repeat CMP in the AM  Anemia of Chronic disease/Macrocytic Anemia -Patient's Hgb/Hct went from 10.2/31.1 -> 9.4/28.8 -> 8.6/25.8 -> 7.7/23.4 -> 7.5/22.0 -> 8.3/24.6 -Heme/Onc will give 1 unit of pRBCs to keep Hgb/Hct >8 -Continue to Monitor for S/Sx of Bleeding; Had some Hematuria (U/A) and Epistaxis  -Repeat CBC in the AM  Obesity -Complicates overall prognosis and Care -Estimated body mass index is 32.64 kg/m as calculated from the following:   Height as of this encounter: $RemoveBeforeD'6\' 2"'ePFKnIugmFMtNF$  (1.88 m).   Weight as of this encounter: 115.3 kg. -Weight Loss and  Dietary Counseling given  -Nutritionist consulted and recommending Beneprotein TIDwm and Prosource Plus po BID  DVT prophylaxis: SCDs given Anemia and Thrombocytopenia  Code Status: FULL CODE Family Communication: No family present at bedside  Disposition Plan: Pending further Clinical Workup; PT/OT recommending home Health PT  Status is: Inpatient  Remains inpatient appropriate because:Unsafe d/c plan, IV treatments appropriate due to intensity of illness or inability to take PO and Inpatient level of care appropriate due to severity of illness   Dispo: The patient is from: Home               Anticipated d/c is to: Home              Patient currently is medically stable to d/c.   Difficult to place patient No  Consultants:   Medical Oncology  Nephrology  Infectious Diseases   General Surgery    Procedures: None  Antimicrobials:  Anti-infectives (From admission, onward)   Start     Dose/Rate Route Frequency Ordered Stop   07/04/20 1000  ciprofloxacin (CIPRO) IVPB 400 mg  Status:  Discontinued        400 mg 200 mL/hr over 60 Minutes Intravenous Every 24 hours 07/04/20 0848 07/05/20 1550   07/03/20 1300  ceFEPIme (MAXIPIME) 2 g in sodium chloride 0.9 % 100 mL IVPB        2 g 200 mL/hr over 30 Minutes Intravenous Every 24 hours 07/03/20 1153          Subjective: Seen and examined at bedside and was still having a lot of diarrhea.  Denies any abdominal pain or nausea or vomiting.  States he felt about the same.  Not having any chest pain or shortness of breath.  No other concerns or complaints at this time.  Objective: Vitals:   07/05/20 0500 07/05/20 0612 07/05/20 1211 07/05/20 1434  BP:  125/74  114/63  Pulse:  (!) 110  85  Resp:  20  17  Temp:  98.4 F (36.9 C)  97.9 F (36.6 C)  TempSrc:  Oral  Oral  SpO2:  99% 100% 100%  Weight: 115.3 kg     Height:        Intake/Output Summary (Last 24 hours) at 07/05/2020 1706 Last data filed at 07/05/2020 0636 Gross  per 24 hour  Intake 240 ml  Output 4000 ml  Net -3760 ml   Filed Weights   07/03/20 0006 07/05/20 0500  Weight: 116.2 kg 115.3 kg   Examination: Physical Exam:  Constitutional: WN/WD obese Caucasian male currently in no acute distress appears calm Eyes: Lids and conjunctivae normal, sclerae anicteric  ENMT: External Ears, Nose appear normal. Grossly normal hearing. Neck: Appears normal, supple, no cervical masses, normal ROM, no appreciable thyromegaly: No JVD Respiratory: Diminished to auscultation bilaterally, no wheezing, rales, rhonchi or crackles. Normal respiratory effort and patient is not tachypenic. No accessory muscle use.  Unlabored breathing Cardiovascular: RRR, no murmurs / rubs /  gallops. S1 and S2 auscultated.  Trace extremity edema Abdomen: Soft, non-tender,Distended 2/2 body habitus. No masses palpated. No appreciable hepatosplenomegaly. Bowel sounds positive.  GU: Deferred. Musculoskeletal: No clubbing / cyanosis of digits/nails. No joint deformity upper and lower extremities.  Skin: No rashes, lesions, ulcers on a limited skin evaluation. No induration; Warm and dry.  Neurologic: CN 2-12 grossly intact with no focal deficits. Romberg sign and cerebellar reflexes not assessed.  Psychiatric: Normal judgment and insight. Alert and oriented x 3. Normal mood and appropriate affect.   Data Reviewed: I have personally reviewed following labs and imaging studies  CBC: Recent Labs  Lab 06/30/20 1030 07/03/20 0009 07/03/20 0757 07/04/20 0537 07/05/20 1054  WBC 3.6* 0.1* <0.1* <0.1* <0.1*  NEUTROABS 2.8 0.0*  --   --   --   HGB 9.4* 8.6* 7.7* 7.5* 8.3*  HCT 28.8* 25.8* 23.4* 22.0* 24.6*  MCV 100.3* 98.5 100.4* 96.9 97.6  PLT 37* 17* 24* 12* 9*   Basic Metabolic Panel: Recent Labs  Lab 06/30/20 1030 07/03/20 0009 07/04/20 0537 07/05/20 1054  NA 131* 131* 129* 129*  127*  K 3.9 4.2 3.9 3.2*  3.2*  CL 101 104 102 104  103  CO2 19* 16* 17* 13*  14*   GLUCOSE 167* 109* 128* 158*  162*  BUN 82* 96* 96* 89*  86*  CREATININE 6.00* 6.35* 6.88* 6.77*  5.91*  CALCIUM 8.6* 7.9* 7.5* 7.7*  7.7*  MG 1.6*  --  1.4* 1.7  1.7  PHOS 4.4  --  4.9* 4.2  4.1   GFR: Estimated Creatinine Clearance: 14.1 mL/min (A) (by C-G formula based on SCr of 6.77 mg/dL (H)). Liver Function Tests: Recent Labs  Lab 06/30/20 1030 07/03/20 0009 07/04/20 0537 07/05/20 1054  AST $Re'20 27 18 15  15  'XBL$ ALT 21 34 $Rem'26 23  22  'eVwu$ ALKPHOS 68 54 34* 32*  32*  BILITOT 0.6 1.0 0.9 1.0  0.7  PROT 6.6 6.3* 5.4* 5.4*  5.4*  ALBUMIN 3.8 3.3* 2.6* 2.5*  2.5*   No results for input(s): LIPASE, AMYLASE in the last 168 hours. No results for input(s): AMMONIA in the last 168 hours. Coagulation Profile: No results for input(s): INR, PROTIME in the last 168 hours. Cardiac Enzymes: No results for input(s): CKTOTAL, CKMB, CKMBINDEX, TROPONINI in the last 168 hours. BNP (last 3 results) No results for input(s): PROBNP in the last 8760 hours. HbA1C: No results for input(s): HGBA1C in the last 72 hours. CBG: No results for input(s): GLUCAP in the last 168 hours. Lipid Profile: No results for input(s): CHOL, HDL, LDLCALC, TRIG, CHOLHDL, LDLDIRECT in the last 72 hours. Thyroid Function Tests: No results for input(s): TSH, T4TOTAL, FREET4, T3FREE, THYROIDAB in the last 72 hours. Anemia Panel: No results for input(s): VITAMINB12, FOLATE, FERRITIN, TIBC, IRON, RETICCTPCT in the last 72 hours. Sepsis Labs: Recent Labs  Lab 07/03/20 0132  LATICACIDVEN 1.8    Recent Results (from the past 240 hour(s))  Blood culture (routine x 2)     Status: None (Preliminary result)   Collection Time: 07/03/20  1:28 AM   Specimen: BLOOD  Result Value Ref Range Status   Specimen Description   Final    BLOOD RIGHT ANTECUBITAL Performed at [facility] [address], [place], [place] [address]    Special Requests   Final    BOTTLES DRAWN AEROBIC AND ANAEROBIC Blood  Culture results may not be optimal due to an excessive volume of blood received  in culture bottles Performed at [facility], [place] [address], [place], [place] [address]    Culture   Final    NO GROWTH 2 DAYS Performed at [facility] [address], [place], [place] [address]    Report Status PENDING  Incomplete  Blood culture (routine x 2)     Status: Abnormal   Collection Time: 07/03/20  1:33 AM   Specimen: BLOOD  Result Value Ref Range Status   Specimen Description   Final    BLOOD RIGHT HAND Performed at [name] [address], [place], [place] [address]    Special Requests   Final    BOTTLES DRAWN AEROBIC AND ANAEROBIC Blood Culture results may not be optimal due to an excessive volume of blood received in culture bottles Performed at [facility] [address] [place], [place] [address]    Culture  Setup Time   Final    AEROBIC BOTTLE ONLY GRAM NEGATIVE RODS CRITICAL RESULT CALLED TO, READ BACK BY AND VERIFIED WITHSeleta Rhymes [employer] 07/04/20 0252 JDW Performed at [facility], [place] [address] [place], [place] [address]    Culture PSEUDOMONAS AERUGINOSA (A)  Final   Report Status 07/05/2020 FINAL  Final   Organism ID, Bacteria PSEUDOMONAS AERUGINOSA  Final      Susceptibility   Pseudomonas aeruginosa - MIC*    CEFTAZIDIME 4 SENSITIVE Sensitive     CIPROFLOXACIN <=0.25 SENSITIVE Sensitive     GENTAMICIN <=1 SENSITIVE Sensitive     IMIPENEM 2 SENSITIVE Sensitive     PIP/TAZO 8 SENSITIVE Sensitive     CEFEPIME 2 SENSITIVE Sensitive     * PSEUDOMONAS AERUGINOSA  Resp Panel by RT-PCR (Flu A&B, Covid) Nasopharyngeal Swab     Status: None   Collection Time: 07/03/20  1:33 AM   Specimen: Nasopharyngeal Swab; Nasopharyngeal(NP) swabs in vial transport medium  Result Value Ref Range Status   SARS Coronavirus 2 by RT PCR NEGATIVE NEGATIVE Final    Comment: (NOTE) SARS-CoV-2 target nucleic acids are NOT  DETECTED.  The SARS-CoV-2 RNA is generally detectable in upper respiratory specimens during the acute phase of infection. The lowest concentration of SARS-CoV-2 viral copies this assay can detect is 138 copies/mL. A negative result does not preclude SARS-Cov-2 infection and should not be used as the sole basis for treatment or other patient management decisions. A negative result may occur with  improper specimen collection/handling, submission of specimen other than nasopharyngeal swab, presence of viral mutation(s) within the areas targeted by this assay, and inadequate number of viral copies(<138 copies/mL). A negative result must be combined with clinical observations, patient history, and epidemiological information. The expected result is Negative.  Fact Sheet for Patients:  EntrepreneurPulse.com.au  Fact Sheet for Healthcare Providers:  IncredibleEmployment.be  This test is no t yet approved or cleared by the [place] FDA and  has been authorized for detection and/or diagnosis of SARS-CoV-2 by FDA under an Emergency Use Authorization (EUA). This EUA will remain  in effect (meaning this test can be used) for the duration of the COVID-19 declaration under Section 564(b)(1) of the Act, 21 U.S.C.section 360bbb-3(b)(1), unless the authorization is terminated  or revoked sooner.       Influenza A by PCR NEGATIVE NEGATIVE Final   Influenza B by PCR NEGATIVE NEGATIVE Final    Comment: (NOTE) The Xpert Xpress SARS-CoV-2/FLU/RSV plus assay is intended as an aid in the diagnosis of influenza from Nasopharyngeal  swab specimens and should not be used as a sole basis for treatment. Nasal washings and aspirates are unacceptable for Xpert Xpress SARS-CoV-2/FLU/RSV testing.  Fact Sheet for Patients: EntrepreneurPulse.com.au  Fact Sheet for Healthcare Providers: IncredibleEmployment.be  This test is not yet  approved or cleared by the [place] FDA and has been authorized for detection and/or diagnosis of SARS-CoV-2 by FDA under an Emergency Use Authorization (EUA). This EUA will remain in effect (meaning this test can be used) for the duration of the COVID-19 declaration under Section 564(b)(1) of the Act, 21 U.S.C. section 360bbb-3(b)(1), unless the authorization is terminated or revoked.  Performed at [facility], [place] [address], [place], [place] [address]   Blood Culture ID Panel (Reflexed)     Status: Abnormal   Collection Time: 07/03/20  1:33 AM  Result Value Ref Range Status   Enterococcus faecalis NOT DETECTED NOT DETECTED Final   Enterococcus Faecium NOT DETECTED NOT DETECTED Final   Listeria monocytogenes NOT DETECTED NOT DETECTED Final   Staphylococcus species NOT DETECTED NOT DETECTED Final   Staphylococcus aureus (BCID) NOT DETECTED NOT DETECTED Final   Staphylococcus epidermidis NOT DETECTED NOT DETECTED Final   Staphylococcus lugdunensis NOT DETECTED NOT DETECTED Final   Streptococcus species NOT DETECTED NOT DETECTED Final   Streptococcus agalactiae NOT DETECTED NOT DETECTED Final   Streptococcus pneumoniae NOT DETECTED NOT DETECTED Final   Streptococcus pyogenes NOT DETECTED NOT DETECTED Final   A.calcoaceticus-baumannii NOT DETECTED NOT DETECTED Final   Bacteroides fragilis NOT DETECTED NOT DETECTED Final   Enterobacterales NOT DETECTED NOT DETECTED Final   Enterobacter cloacae complex NOT DETECTED NOT DETECTED Final   Escherichia coli NOT DETECTED NOT DETECTED Final   Klebsiella aerogenes NOT DETECTED NOT DETECTED Final   Klebsiella oxytoca NOT DETECTED NOT DETECTED Final   Klebsiella pneumoniae NOT DETECTED NOT DETECTED Final   Proteus species NOT DETECTED NOT DETECTED Final   Salmonella species NOT DETECTED NOT DETECTED Final   Serratia marcescens NOT DETECTED NOT DETECTED Final   Haemophilus influenzae NOT DETECTED NOT DETECTED Final    Neisseria meningitidis NOT DETECTED NOT DETECTED Final   Pseudomonas aeruginosa DETECTED (A) NOT DETECTED Final    Comment: CRITICAL RESULT CALLED TO, READ BACK BY AND VERIFIED WITH: E JACKSON PHARMD 07/04/20 0252 JDW    Stenotrophomonas maltophilia NOT DETECTED NOT DETECTED Final   Candida albicans NOT DETECTED NOT DETECTED Final   Candida auris NOT DETECTED NOT DETECTED Final   Candida glabrata NOT DETECTED NOT DETECTED Final   Candida krusei NOT DETECTED NOT DETECTED Final   Candida parapsilosis NOT DETECTED NOT DETECTED Final   Candida tropicalis NOT DETECTED NOT DETECTED Final   Cryptococcus neoformans/gattii NOT DETECTED NOT DETECTED Final   CTX-M ESBL NOT DETECTED NOT DETECTED Final   Carbapenem resistance IMP NOT DETECTED NOT DETECTED Final   Carbapenem resistance KPC NOT DETECTED NOT DETECTED Final   Carbapenem resistance NDM NOT DETECTED NOT DETECTED Final   Carbapenem resistance VIM NOT DETECTED NOT DETECTED Final    Comment: Performed at [facility], [address], [place], [place] [address]  C Difficile Quick Screen w PCR reflex     Status: Abnormal   Collection Time: 07/03/20  7:01 PM   Specimen: STOOL  Result Value Ref Range Status   C Diff antigen POSITIVE (A) NEGATIVE Final   C Diff toxin NEGATIVE NEGATIVE Final   C Diff interpretation Results are indeterminate. See PCR results.  Final    Comment: Performed at  [facility], [place] [address] [place], [place] [address]  Gastrointestinal Panel by PCR , Stool     Status: None   Collection Time: 07/03/20  7:01 PM   Specimen: STOOL  Result Value Ref Range Status   Campylobacter species NOT DETECTED NOT DETECTED Final   Plesimonas shigelloides NOT DETECTED NOT DETECTED Final   Salmonella species NOT DETECTED NOT DETECTED Final   Yersinia enterocolitica NOT DETECTED NOT DETECTED Final   Vibrio species NOT DETECTED NOT DETECTED Final   Vibrio cholerae NOT DETECTED NOT DETECTED Final    Enteroaggregative E coli (EAEC) NOT DETECTED NOT DETECTED Final   Enteropathogenic E coli (EPEC) NOT DETECTED NOT DETECTED Final   Enterotoxigenic E coli (ETEC) NOT DETECTED NOT DETECTED Final   Shiga like toxin producing E coli (STEC) NOT DETECTED NOT DETECTED Final   Shigella/Enteroinvasive E coli (EIEC) NOT DETECTED NOT DETECTED Final   Cryptosporidium NOT DETECTED NOT DETECTED Final   Cyclospora cayetanensis NOT DETECTED NOT DETECTED Final   Entamoeba histolytica NOT DETECTED NOT DETECTED Final   Giardia lamblia NOT DETECTED NOT DETECTED Final   Adenovirus F40/41 NOT DETECTED NOT DETECTED Final   Astrovirus NOT DETECTED NOT DETECTED Final   Norovirus GI/GII NOT DETECTED NOT DETECTED Final   Rotavirus A NOT DETECTED NOT DETECTED Final   Sapovirus (I, II, IV, and V) NOT DETECTED NOT DETECTED Final    Comment: Performed at [facility], [address] [place], [place] [address]  C. Diff by PCR, Reflexed     Status: None   Collection Time: 07/03/20  7:01 PM  Result Value Ref Range Status   Toxigenic C. Difficile by PCR NEGATIVE NEGATIVE Final    Comment: Patient is colonized with non toxigenic C. difficile. May not need treatment unless significant symptoms are present. Performed at [facility], [place] [address] [place], [place] [address]   Culture, blood (routine x 2)     Status: None (Preliminary result)   Collection Time: 07/04/20  9:43 AM   Specimen: BLOOD LEFT HAND  Result Value Ref Range Status   Specimen Description   Final    BLOOD LEFT HAND Performed at [facility] [address], [place], [place] [address]    Special Requests   Final    BOTTLES DRAWN AEROBIC ONLY Blood Culture adequate volume Performed at [facility] [address] [place], [place] [address]    Culture   Final    NO GROWTH 1 DAY Performed at [facility], [place] [address], [place], [place] [address]    Report Status PENDING  Incomplete   Culture, blood (routine x 2)     Status: None (Preliminary result)   Collection Time: 07/04/20  9:43 AM   Specimen: BLOOD LEFT HAND  Result Value Ref Range Status   Specimen Description   Final    BLOOD LEFT HAND Performed at [facility] [address] [place], [place] [address]    Special Requests   Final    BOTTLES DRAWN AEROBIC ONLY Blood Culture adequate volume Performed at [facility] [address] [place], [place] [address]    Culture   Final    NO GROWTH 1 DAY Performed at [facility], [place] [address] [place], [place] [address]    Report Status PENDING  Incomplete     RN Pressure Injury Documentation:     Estimated body mass index is 32.64 kg/m as  calculated from the following:   Height as of this encounter: $RemoveBeforeD'6\' 2"'[id number]$  (1.88 m).   Weight as of this encounter: 115.3 kg.  Malnutrition Type:  Nutrition Problem: Increased nutrient needs Etiology: cancer and cancer related treatments   Malnutrition Characteristics:  Signs/Symptoms: estimated needs  Nutrition Interventions:  Interventions: Prostat,Refer to RD note for recommendations   Radiology Studies: US RENAL  Result Date: 07/04/2020 CLINICAL DATA:  Acute renal failure superimposed on stage 5 chronic kidney disease. History of bladder tumor post change urethral resection. EXAM: RENAL / URINARY TRACT ULTRASOUND COMPLETE COMPARISON:  Head CT 06/04/2020 FINDINGS: Right Kidney: Renal measurements: 11.3 x 4.8 x 5.5 cm = volume: 156 mL. Thinning of the renal parenchyma with increased echogenicity. No hydronephrosis. No evidence of focal lesion. Left Kidney: Renal measurements: 12.9 x 5.5 x 5.0 cm = volume: 171 mL. Thinning of the renal parenchyma with increased echogenicity. No hydronephrosis. No evidence of focal renal lesion. Bladder: Physiologically distended. Area of slightly lobulated echogenic bladder wall thickening is seen about the right bladder wall. No vascularity.  Other: None. IMPRESSION: 1. No obstructive uropathy. 2. Bilateral renal parenchymal thinning and increased echogenicity consistent with chronic medical renal disease. 3. Echogenic bladder wall thickening in the right bladder, corresponding to that seen on prior PET CT, at site of hypermetabolism prior PET. Electronically Signed   By: Keith Rake M.D.   On: 07/04/2020 17:36   DG Abd Portable 2V  Result Date: 07/04/2020 CLINICAL DATA:  Pseudomonas, diarrhea. EXAM: PORTABLE ABDOMEN - 2 VIEW COMPARISON:  February 12, 2020. FINDINGS: The bowel gas pattern is normal. There is no evidence of free air. No radio-opaque calculi or other significant radiographic abnormality is seen. IMPRESSION: Negative. Electronically Signed   By: Marijo Conception M.D.   On: 07/04/2020 08:54   Scheduled Meds: . (feeding supplement) PROSource Plus  30 mL Oral BID BM  . Chlorhexidine Gluconate Cloth  6 each Topical Daily  . DULoxetine  60 mg Oral Daily  . protein supplement  1 Scoop Oral TID WC  . sodium chloride flush  10-40 mL Intracatheter Q12H  . Tbo-Filgrastim  480 mcg Subcutaneous q1800   Continuous Infusions: . sodium chloride 75 mL/hr at 07/04/20 2240  . ceFEPime (MAXIPIME) IV 2 g (07/05/20 1419)    LOS: 2 days   Kerney Elbe, DO [facility] PAGER is on [place]  If 7PM-7AM, please contact night-coverage [email or web address]

## 2020-07-05 NOTE — Consult Note (Signed)
[facility]  Total days of antibiotics 3       Reason for Consult:pseudomonas sepsis in immunocompromised host/febrile neutropenia    Referring Physician: sheikh  Active Problems:   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 (HCC)   Thrombocytopenia (HCC)   Essential hypertension   Chronic atrial fibrillation (HCC)   Neutropenia (HCC)   CKD (chronic kidney disease) stage 5, GFR less than 15 ml/min (HCC)   Weakness generalized   Anemia of chronic disease    HPI: [name] Rose is a 70 y.o. male history of chronic afib, CKD 5, HTN, and metastatic bladder cancer who is currently on trodelvy, with recent dose on 3/14 and 3/21, & GCSF on 3/22, who felt fine the day after receiving chemotherapy but then and quick onset of generalized weakness and foundto have neutropenia with wbc of 0.1, hgb 8, and plt of 17K. Cr still elevated at 6.35 close to baseline. Blood cx were collected that identified pseudomonas for which he was started on cefepime and cipro. He has had his port in place for several years. He recalls last October 2021, having bacteremia (kleb species) for which his port stayed in place. He denies any difficulty with his portacath or any surrounding erythema or drainage. Since admit, he has been receiving daily GCSF, still feeling significant weakness. Due to his CKD 5, renal consultants are also providing recs to managing aki.  ROS: starting to have perfuse diarrhea.   Past Medical History:  Diagnosis Date  . Anemia   . Arthritis   . 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 (HCC) 09/29/2016  . Bladder tumor   . Chronic renal insufficiency    stage IV  . Dyspnea    with exertion  . Essential hypertension 06/23/2018  . Goals of care, counseling/discussion 09/30/2016  . History of prostate cancer followed by pcp dr Tiburcio Pea-  per pt last PSA undetectable   dx 2008-- (Stage T1c, Gleason 3+3,  PSA 4.58, vol 99cc)  s/p   radical prostatectomy (nerve sparing bilateral)   . Hyperglycemia 06/23/2018  . Hypertension   . Mild hyperlipidemia 06/23/2018  . Persistent atrial fibrillation (HCC)    atrial fib  . Pre-diabetes   . Wears glasses     Allergies: No Known Allergies  MEDICATIONS: . (feeding supplement) PROSource Plus  30 mL Oral BID BM  . Chlorhexidine Gluconate Cloth  6 each Topical Daily  . DULoxetine  60 mg Oral Daily  . protein supplement  1 Scoop Oral TID WC  . sodium chloride flush  10-40 mL Intracatheter Q12H  . Tbo-Filgrastim  480 mcg Subcutaneous q1800    Social History   Tobacco Use  . Smoking status: Former Smoker    Packs/day: 1.00    Years: 16.00    Pack years: 16.00    Types: Cigarettes    Quit date: 09/25/1984    Years since quitting: 35.8  . Smokeless tobacco: Never Used  Vaping Use  . Vaping Use: Never used  Substance Use Topics  . Alcohol use: Yes    Comment: occasionally  . Drug use: Yes    Types: Marijuana    Comment: after chemo to help sleep    Family History  Problem Relation Age of Onset  . Hypertension Mother   . Aneurysm Mother   . Emphysema Father   . Hypertension Father      Review of Systems  Constitutional: Negative for fever, chills, diaphoresis, activity change, appetite change,  fatigue and unexpected weight change.  HENT: Negative for congestion, sore throat, rhinorrhea, sneezing, trouble swallowing and sinus pressure.  Eyes: Negative for photophobia and visual disturbance.  Respiratory: Negative for cough, chest tightness, shortness of breath, wheezing and stridor.  Cardiovascular: Negative for chest pain, palpitations and leg swelling.  Gastrointestinal: Negative for nausea, vomiting, abdominal pain, diarrhea, constipation, blood in stool, abdominal distention and anal bleeding.  Genitourinary: Negative for dysuria, hematuria, flank pain and difficulty urinating.  Musculoskeletal: +weakness. Negative for myalgias, back pain, joint swelling,  arthralgias and gait problem.  Skin: Negative for color change, pallor, rash and wound.  Neurological: Negative for dizziness, tremors, weakness and light-headedness.  Hematological: Negative for adenopathy. Does not bruise/bleed easily.  Psychiatric/Behavioral: Negative for behavioral problems, confusion, sleep disturbance, dysphoric mood, decreased concentration and agitation.     OBJECTIVE: Temp:  [97.8 F (36.6 C)-98.5 F (36.9 C)] 97.9 F (36.6 C) (03/26 1434) Pulse Rate:  [85-112] 85 (03/26 1434) Resp:  [17-20] 17 (03/26 1434) BP: (114-142)/(63-77) 114/63 (03/26 1434) SpO2:  [97 %-100 %] 100 % (03/26 1434) Weight:  [115.3 kg] 115.3 kg (03/26 0500) Physical Exam  Constitutional: He is oriented to person, place, and time. He appears well-developed and well-nourished. No distress.  HENT:  Mouth/Throat: Oropharynx is clear and moist. No oropharyngeal exudate.  Cardiovascular: Normal rate, regular rhythm and normal heart sounds. Exam reveals no gallop and no friction rub.  No murmur heard.  Chest wall: no tenderness at right upper chest wall. Dried blood on bandage Pulmonary/Chest: Effort normal and breath sounds normal. No respiratory distress. He has no wheezes.  Abdominal: Soft. Bowel sounds are normal. mildly distension. There is no tenderness.  Lymphadenopathy:  He has no cervical adenopathy.  Neurological: He is alert and oriented to person, place, and time.  Skin: Skin is warm and dry. No rash noted. No erythema.  Psychiatric: He has a normal mood and affect. His behavior is normal.     LABS: Results for orders placed or performed during the hospital encounter of 07/02/20 (from the past 48 hour(s))  C Difficile Quick Screen w PCR reflex     Status: Abnormal   Collection Time: 07/03/20  7:01 PM   Specimen: STOOL  Result Value Ref Range   C Diff antigen POSITIVE (A) NEGATIVE   C Diff toxin NEGATIVE NEGATIVE   C Diff interpretation Results are indeterminate. See PCR  results.     Comment: Performed at [facility], [address], [place], [place] [address]  Gastrointestinal Panel by PCR , Stool     Status: None   Collection Time: 07/03/20  7:01 PM   Specimen: STOOL  Result Value Ref Range   Campylobacter species NOT DETECTED NOT DETECTED   Plesimonas shigelloides NOT DETECTED NOT DETECTED   Salmonella species NOT DETECTED NOT DETECTED   Yersinia enterocolitica NOT DETECTED NOT DETECTED   Vibrio species NOT DETECTED NOT DETECTED   Vibrio cholerae NOT DETECTED NOT DETECTED   Enteroaggregative E coli (EAEC) NOT DETECTED NOT DETECTED   Enteropathogenic E coli (EPEC) NOT DETECTED NOT DETECTED   Enterotoxigenic E coli (ETEC) NOT DETECTED NOT DETECTED   Shiga like toxin producing E coli (STEC) NOT DETECTED NOT DETECTED   Shigella/Enteroinvasive E coli (EIEC) NOT DETECTED NOT DETECTED   Cryptosporidium NOT DETECTED NOT DETECTED   Cyclospora cayetanensis NOT DETECTED NOT DETECTED   Entamoeba histolytica NOT DETECTED NOT DETECTED   Giardia lamblia NOT DETECTED NOT DETECTED   Adenovirus F40/41 NOT DETECTED NOT DETECTED  Astrovirus NOT DETECTED NOT DETECTED   Norovirus GI/GII NOT DETECTED NOT DETECTED   Rotavirus A NOT DETECTED NOT DETECTED   Sapovirus (I, II, IV, and V) NOT DETECTED NOT DETECTED    Comment: Performed at [facility], [address], [place], [place] [address]  C. Diff by PCR, Reflexed     Status: None   Collection Time: 07/03/20  7:01 PM  Result Value Ref Range   Toxigenic C. Difficile by PCR NEGATIVE NEGATIVE    Comment: Patient is colonized with non toxigenic C. difficile. May not need treatment unless significant symptoms are present. Performed at [facility], [place] [address], [place], [place] [address]   Phosphorus     Status: Abnormal   Collection Time: 07/04/20  5:37 AM  Result Value Ref Range   Phosphorus 4.9 (H) 2.5 - 4.6 mg/dL    Comment: Performed at [facility], [place]  [address] [place], [place] [address]  Magnesium     Status: Abnormal   Collection Time: 07/04/20  5:37 AM  Result Value Ref Range   Magnesium 1.4 (L) 1.7 - 2.4 mg/dL    Comment: Performed at [facility], [place] [address] [place], [place] [address]  CBC with Differential/Platelet     Status: Abnormal   Collection Time: 07/04/20  5:37 AM  Result Value Ref Range   WBC <0.1 (LL) 4.0 - 10.5 K/uL    Comment: CRITICAL VALUE NOTED.  VALUE IS CONSISTENT WITH PREVIOUSLY REPORTED AND CALLED VALUE. REPEATED TO VERIFY    RBC 2.27 (L) 4.22 - 5.81 MIL/uL   Hemoglobin 7.5 (L) 13.0 - 17.0 g/dL   HCT 22.0 (L) 39.0 - 52.0 %   MCV 96.9 80.0 - 100.0 fL   MCH 33.0 26.0 - 34.0 pg   MCHC 34.1 30.0 - 36.0 g/dL   RDW 13.9 11.5 - 15.5 %   Platelets 12 (LL) 150 - 400 K/uL    Comment: Immature Platelet Fraction may be clinically indicated, consider ordering this additional test [id number] CRITICAL VALUE NOTED.  VALUE IS CONSISTENT WITH PREVIOUSLY REPORTED AND CALLED VALUE. REPEATED TO VERIFY    nRBC 0.0 0.0 - 0.2 %   Smear Review TOO FEW TO COUNT, SMEAR AVAILABLE FOR REVIEW     Comment: RARE LYMPHOCYTES AND EOSINOPHILS. Performed at [facility], [place] [address] [place], [place] [address]   Comprehensive metabolic panel     Status: Abnormal   Collection Time: 07/04/20  5:37 AM  Result Value Ref Range   Sodium 129 (L) 135 - 145 mmol/L   Potassium 3.9 3.5 - 5.1 mmol/L   Chloride 102 98 - 111 mmol/L   CO2 17 (L) 22 - 32 mmol/L   Glucose, Bld 128 (H) 70 - 99 mg/dL    Comment: Glucose reference range applies only to samples taken after fasting for at least 8 hours.   BUN 96 (H) 8 - 23 mg/dL   Creatinine, Ser 6.88 (H) 0.61 - 1.24 mg/dL   Calcium 7.5 (L) 8.9 - 10.3 mg/dL   Total Protein 5.4 (L) 6.5 - 8.1 g/dL   Albumin 2.6 (L) 3.5 - 5.0 g/dL   AST 18 15 - 41 U/L   ALT 26 0 - 44 U/L   Alkaline Phosphatase 34 (L) 38 - 126 U/L   Total Bilirubin 0.9 0.3 - 1.2 mg/dL    GFR, Estimated 8 (L) >60 mL/min    Comment: (NOTE) Calculated using the CKD-EPI Creatinine Equation (2021)  Anion gap 10 5 - 15    Comment: Performed at [facility], [place] [address], [place], [place] [address]  Culture, blood (routine x 2)     Status: None (Preliminary result)   Collection Time: 07/04/20  9:43 AM   Specimen: BLOOD LEFT HAND  Result Value Ref Range   Specimen Description      BLOOD LEFT HAND Performed at [place] [address] [place], [place] [address]    Special Requests      BOTTLES DRAWN AEROBIC ONLY Blood Culture adequate volume Performed at [place] [address] [place], [place] [address]    Culture      NO GROWTH 1 DAY Performed at [place] [address] [place], [place] [address]    Report Status PENDING   Culture, blood (routine x 2)     Status: None (Preliminary result)   Collection Time: 07/04/20  9:43 AM   Specimen: BLOOD LEFT HAND  Result Value Ref Range   Specimen Description      BLOOD LEFT HAND Performed at [place] [address], [place], [place] [address]    Special Requests      BOTTLES DRAWN AEROBIC ONLY Blood Culture adequate volume Performed at [place] [address], [place], [place] [address]    Culture      NO GROWTH 1 DAY Performed at [place] [address], [place], [place] [address]    Report Status PENDING   CBC with Differential/Platelet     Status: Abnormal   Collection Time: 07/05/20 10:54 AM  Result Value Ref Range   WBC <0.1 (LL) 4.0 - 10.5 K/uL    Comment: CRITICAL VALUE NOTED.  VALUE IS CONSISTENT WITH PREVIOUSLY REPORTED AND CALLED VALUE. REPEATED TO VERIFY    RBC 2.52 (L) 4.22 - 5.81 MIL/uL   Hemoglobin 8.3 (L) 13.0 - 17.0 g/dL   HCT 24.6 (L) 39.0 - 52.0 %   MCV 97.6 80.0 - 100.0 fL   MCH 32.9 26.0 - 34.0 pg   MCHC 33.7 30.0 - 36.0 g/dL   RDW 13.6 11.5 - 15.5 %    Platelets 9 (LL) 150 - 400 K/uL    Comment: CRITICAL VALUE NOTED.  VALUE IS CONSISTENT WITH PREVIOUSLY REPORTED AND CALLED VALUE. REPEATED TO VERIFY SPECIMEN CHECKED FOR CLOTS    nRBC 0 0.0 - 0.2 %   Smear Review TOO FEW TO COUNT, SMEAR AVAILABLE FOR REVIEW     Comment: SMEAR STAINED AND AVAILABLE FOR REVIEW Performed at [place] [address], [place],  [address]   Comprehensive metabolic panel     Status: Abnormal   Collection Time: 07/05/20 10:54 AM  Result Value Ref Range   Sodium 129 (L) 135 - 145 mmol/L   Potassium 3.2 (L) 3.5 - 5.1 mmol/L   Chloride 104 98 - 111 mmol/L   CO2 13 (L) 22 - 32 mmol/L   Glucose, Bld 158 (H) 70 - 99 mg/dL    Comment: Glucose reference range applies only to samples taken after fasting for at least 8 hours.   BUN 89 (H) 8 - 23 mg/dL   Creatinine, Ser 6.77 (H) 0.61 - 1.24 mg/dL   Calcium 7.7 (L) 8.9 - 10.3 mg/dL   Total Protein 5.4 (L) 6.5 - 8.1 g/dL   Albumin 2.5 (L) 3.5 - 5.0 g/dL   AST 15 15 - 41 U/L  ALT 23 0 - 44 U/L   Alkaline Phosphatase 32 (L) 38 - 126 U/L   Total Bilirubin 1.0 0.3 - 1.2 mg/dL   GFR, Estimated 8 (L) >60 mL/min    Comment: (NOTE) Calculated using the CKD-EPI Creatinine Equation (2021)    Anion gap 12 5 - 15    Comment: Performed at [facility], [place] [address], [place], [place] [address]  Phosphorus     Status: None   Collection Time: 07/05/20 10:54 AM  Result Value Ref Range   Phosphorus 4.2 2.5 - 4.6 mg/dL    Comment: Performed at [facility], [place] [address] [place], [place] [address]  Magnesium     Status: None   Collection Time: 07/05/20 10:54 AM  Result Value Ref Range   Magnesium 1.7 1.7 - 2.4 mg/dL    Comment: Performed at [facility], [place] [address] [place], [place] [address]  Comprehensive metabolic panel     Status: Abnormal   Collection Time: 07/05/20 10:54 AM  Result Value Ref Range   Sodium 127 (L) 135 - 145  mmol/L   Potassium 3.2 (L) 3.5 - 5.1 mmol/L    Comment: DELTA CHECK NOTED   Chloride 103 98 - 111 mmol/L   CO2 14 (L) 22 - 32 mmol/L   Glucose, Bld 162 (H) 70 - 99 mg/dL    Comment: Glucose reference range applies only to samples taken after fasting for at least 8 hours.   BUN 86 (H) 8 - 23 mg/dL   Creatinine, Ser 5.91 (H) 0.61 - 1.24 mg/dL   Calcium 7.7 (L) 8.9 - 10.3 mg/dL   Total Protein 5.4 (L) 6.5 - 8.1 g/dL   Albumin 2.5 (L) 3.5 - 5.0 g/dL   AST 15 15 - 41 U/L   ALT 22 0 - 44 U/L   Alkaline Phosphatase 32 (L) 38 - 126 U/L   Total Bilirubin 0.7 0.3 - 1.2 mg/dL   GFR, Estimated 10 (L) >60 mL/min    Comment: (NOTE) Calculated using the CKD-EPI Creatinine Equation (2021)    Anion gap 10 5 - 15    Comment: Performed at [facility], [place] [address], [place], [place] [address]  Magnesium     Status: None   Collection Time: 07/05/20 10:54 AM  Result Value Ref Range   Magnesium 1.7 1.7 - 2.4 mg/dL    Comment: Performed at [facility], [place] [address], [place], [place] [address]  Phosphorus     Status: None   Collection Time: 07/05/20 10:54 AM  Result Value Ref Range   Phosphorus 4.1 2.5 - 4.6 mg/dL    Comment: Performed at [facility], [place] [address] [place], [place] [address]    MICRO: Pseudomonas aeruginosa      MIC    CEFEPIME 2 SENSITIVE  Sensitive    CEFTAZIDIME 4 SENSITIVE  Sensitive    CIPROFLOXACIN <=0.25 SENS... Sensitive    GENTAMICIN <=1 SENSITIVE  Sensitive    IMIPENEM 2 SENSITIVE  Sensitive    PIP/TAZO 8 SENSITIVE  Sensitive     IMAGING: US RENAL  Result Date: 07/04/2020 CLINICAL DATA:  Acute renal failure superimposed on stage 5 chronic kidney disease. History of bladder tumor post change urethral resection. EXAM: RENAL / URINARY TRACT ULTRASOUND COMPLETE COMPARISON:  Head CT 06/04/2020 FINDINGS: Right Kidney: Renal measurements: 11.3 x 4.8 x 5.5 cm = volume: 156 mL. Thinning of the renal parenchyma  with increased echogenicity.  No hydronephrosis. No evidence of focal lesion. Left Kidney: Renal measurements: 12.9 x 5.5 x 5.0 cm = volume: 171 mL. Thinning of the renal parenchyma with increased echogenicity. No hydronephrosis. No evidence of focal renal lesion. Bladder: Physiologically distended. Area of slightly lobulated echogenic bladder wall thickening is seen about the right bladder wall. No vascularity. Other: None. IMPRESSION: 1. No obstructive uropathy. 2. Bilateral renal parenchymal thinning and increased echogenicity consistent with chronic medical renal disease. 3. Echogenic bladder wall thickening in the right bladder, corresponding to that seen on prior PET CT, at site of hypermetabolism prior PET. Electronically Signed   By: Keith Rake M.D.   On: 07/04/2020 17:36   DG Abd Portable 2V  Result Date: 07/04/2020 CLINICAL DATA:  Pseudomonas, diarrhea. EXAM: PORTABLE ABDOMEN - 2 VIEW COMPARISON:  February 12, 2020. FINDINGS: The bowel gas pattern is normal. There is no evidence of free air. No radio-opaque calculi or other significant radiographic abnormality is seen. IMPRESSION: Negative. Electronically Signed   By: Marijo Conception M.D.   On: 07/04/2020 08:54    HISTORICAL MICRO/IMAGING  Assessment/Plan:  70yo M immunocompromised host with portacath admitted for weakness, and profound neutropenia with pseudomonas bacteremia  - will narrow abtx to cefepime,  - please have portacath removed as it is nearly impossible to treat throw indwelling line with pseudomonas - repeat blood cx once line is removed to help ensure new line is placed when he has cleared bacteremia  Neutropenia =continues to received GCSF  ckd 5 = will have cefepime renally dosed; watch for fluid losses due to diarrhea  Watery diarrhea= found to have non-toxingenic cdiff/ consistent with colonization. If worsens, would consider treating with oral vancomycin

## 2020-07-05 NOTE — Progress Notes (Signed)
He is still having diarrhea.  Unfortunately, no lab work is back yet.  He is actually growing Pseudomonas in his blood.  He is on coverage with Maxipime and Cipro.  Once we get sensitivities, then the coverage can be narrowed.  He was seen by nephrology.  He is still making urine.  I do not see any obvious reason that he needs to be dialyzed right now.  Unfortunately, everything will be dictated by his white blood cell count.  He is on Neupogen.  He had a abdominal film yesterday.  There is no evidence of ileus.  I am not sure how much he is really eating.  He is not having any vomiting.  There is no fever.  There is no bleeding.  He has had no problems with pain.  His vital signs show temperature of 98.4.  Pulse 110.  Blood pressure 125/74.  Oxygen saturation is 99%.  His lungs are clear.  Cardiac exam regular rate and rhythm.  The rate is fairly well controlled.  Abdomen is soft.  He does have bowel sounds.  There is no guarding or rebound tenderness.  Extremities shows no clubbing, cyanosis or edema.  Neurological exam is nonfocal.  Again, everything with respect to getting better will be dictated by his white blood cell count.  Hopefully, this will start coming up early next week.  He is on Neupogen.  We will have to wait the sensitivities for the Pseudomonas.  Thankfully, the atrial fibrillation is not flared up on him.  I know he is getting outstanding care from all the staff on 6 E.  [name], MD  [place] 7:7

## 2020-07-05 NOTE — Progress Notes (Signed)
[facility] Progress Note  Subjective: pt seen in room, very good UOP yesterday > 2 L UOP. Creat 5.9 today. WBC still low at < 0.1.  Getting Granix daily started on 3/24.   Vitals:   07/05/20 0450 07/05/20 0500 07/05/20 0612 07/05/20 1211  BP: 133/76  125/74   Pulse: (!) 108  (!) 110   Resp: 20  20   Temp: 98.4 F (36.9 C)  98.4 F (36.9 C)   TempSrc: Oral  Oral   SpO2: 98%  99% 100%  Weight:  115.3 kg    Height:        Exam:   alert, nad   no jvd  Chest cta bilat  Cor reg no RG  Abd soft ntnd no ascites   Ext no LE edema   Alert, NF, ox3    Date                           Creat               eGFR   2011- 2016                0.7- 1.3   2018                          0.9- 1.6   2019                          1.0- 1.6   Jan -May 2020          1.40- 2.30   Late 2020                  1.40- 1.80        AKI 4.5 > 1.66, AKI 4.0 > 2.60   Jan -Sept 2021         1.9- 2.5   Oct -Dec 2021           2.4- 4.6            AKI 9.8 > 4.6 at dc   Feb 2022                   5.85   March 7                     6.27   March 14                   6.42   March 21- 25, 2022   6.00- 6.88   Home meds:  - amiodroane 200 qd/ eliquis 5 bid/ lipitor 40/ lasix 40 q mwf/ metoprolol 25 bid/ klor-con 20 qd/ sod bicarb 1300 bid  - pepcid 20 bid  - prn's/ vitamins/ supplements     UA 3/23 - clear 150 glu, large Hb, 100 prot, rare bact, >50 rbc, 0-5 wbc   cdif Ag +, toxin neg   CXR - IMPRESSION: No active disease.    BCID +pseudomonas aeruginosa / blood cx's 1 of 2 + for pseudomonas    Assessment/ Plan: 1. AKI on CKD V - baseline creat 5.8- 6.2.  Here w/ gram-neg sepsis and neutropenia related to chemoRx. BP's okay. Creat 6.8 yest and 5.9 today. No uremic signs/ symptoms. Got 2- 3L bolus and getting IVF"s at 75 cc/hr. would continue for now. AKI improving.  No indication for RRT   at this time. Will follow.  2. Neutropenic fevers - +pseudomonas bacteremia. Per pmd, on IV abx 3. Bladder  cancer - per ONC, has been getting active chemoRx 4. Atrial fib - takes eliquis and amio/ BB at home 5. Anemia - d/t chemoRx + CKD. Tranfuse prn.  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 (feeding supplement) PROSource Plus  30 mL Oral BID BM  . Chlorhexidine Gluconate Cloth  6 each Topical Daily  . DULoxetine  60 mg Oral Daily  . protein supplement  1 Scoop Oral TID WC  . sodium chloride flush  10-40 mL Intracatheter Q12H  . Tbo-Filgrastim  480 mcg Subcutaneous q1800   . sodium chloride 75 mL/hr at 07/04/20 2240  . ceFEPime (MAXIPIME) IV 2 g (07/04/20 1309)  . ciprofloxacin 400 mg (07/05/20 0944)  . sodium chloride     acetaminophen **OR** acetaminophen, ondansetron **OR** ondansetron (ZOFRAN) IV, sodium chloride flush

## 2020-07-05 NOTE — Evaluation (Signed)
Physical Therapy Evaluation Patient Details Name: [name] Rose MRN: [id number] DOB: 06-01-1950 Today's Date: 07/05/2020   History of Present Illness  70 year old obese Caucasian male with a past medical history significant for but not limited to bladder cancer, chronic atrial fibrillation, CKD stage V, history of chronic anemia secondary to chronic kidney disease, hypertension as well as other comorbidities who presented via EMS for generalized weakness. Dx of neutropenia, pseudomonas bacteremia, pancytopenia, diarrhea.  Clinical Impression  Pt admitted with above diagnosis. Min assist for sit to stand from elevated bed, min/guard assist to take several pivotal steps bed to recliner with RW. Ambulation deferred 2* active diarrhea and fatigue with standing. Pt stated he feels he and his wife can manage his care at home following acute stay.  Pt currently with functional limitations due to the deficits listed below (see PT Problem List). Pt will benefit from skilled PT to increase their independence and safety with mobility to allow discharge to the venue listed below.       Follow Up Recommendations Home health PT    Equipment Recommendations  None recommended by PT    Recommendations for Other Services       Precautions / Restrictions Precautions Precautions: Fall Precaution Comments: 3-4 falls in past 6 months (occurs when WBC counts are low) Restrictions Weight Bearing Restrictions: No      Mobility  Bed Mobility Overal bed mobility: Modified Independent             General bed mobility comments: increased time, used rail, HOB up    Transfers Overall transfer level: Needs assistance   Transfers: Sit to/from Stand;Stand Pivot Transfers Sit to Stand: Min assist;From elevated surface Stand pivot transfers: Min guard       General transfer comment: bed elevated, min A to power up, VCs hand placement; pivoted to recliner with RW, ambulation deferred 2* active  diarrhea  Ambulation/Gait Ambulation/Gait assistance: Min guard Gait Distance (Feet): 3 Feet Assistive device: Rolling walker (2 wheeled) Gait Pattern/deviations: Step-through pattern;Decreased step length - right;Decreased step length - left     General Gait Details: a few pivotal steps to recliner with RW, distance limited by diarrhea  Stairs            Wheelchair Mobility    Modified Rankin (Stroke Patients Only)       Balance Overall balance assessment: Modified Independent;History of Falls                                           Pertinent Vitals/Pain Pain Assessment: No/denies pain    Home Living Family/patient expects to be discharged to:: Private residence   Available Help at Discharge: Available 24 hours/day;Family Type of Home: House Home Access: Stairs to enter Entrance Stairs-Rails: None Entrance Stairs-Number of Steps: 1 Home Layout: One level Home Equipment: Environmental consultant - 2 wheels;Bedside commode;Shower seat;Cane - single point;Wheelchair - manual      Prior Function Level of Independence: Independent with assistive device(s)         Comments: walks with cane     Hand Dominance   Dominant Hand: Right    Extremity/Trunk Assessment   Upper Extremity Assessment Upper Extremity Assessment: Overall WFL for tasks assessed    Lower Extremity Assessment Lower Extremity Assessment: LLE deficits/detail;RLE deficits/detail;Generalized weakness RLE Deficits / Details: knee ext +4/5 RLE Sensation: history of peripheral neuropathy;decreased light touch LLE Deficits / Details:  wears a soft strap on brace for foot drop, knee ext +4/5, ankle DF ~+3/5 LLE Sensation: history of peripheral neuropathy;decreased light touch    Cervical / Trunk Assessment Cervical / Trunk Assessment: Normal  Communication   Communication: No difficulties  Cognition Arousal/Alertness: Awake/alert Behavior During Therapy: WFL for tasks  assessed/performed Overall Cognitive Status: Within Functional Limits for tasks assessed                                        General Comments      Exercises     Assessment/Plan    PT Assessment Patient needs continued PT services  PT Problem List Decreased activity tolerance;Decreased balance;Decreased mobility       PT Treatment Interventions Therapeutic activities;Therapeutic exercise;Gait training;Patient/family education    PT Goals (Current goals can be found in the Care Plan section)  Acute Rehab PT Goals Patient Stated Goal: golf PT Goal Formulation: With patient Time For Goal Achievement: 07/19/20 Potential to Achieve Goals: Fair    Frequency Min 3X/week   Barriers to discharge        Co-evaluation               AM-PAC PT "6 Clicks" Mobility  Outcome Measure Help needed turning from your back to your side while in a flat bed without using bedrails?: A Little Help needed moving from lying on your back to sitting on the side of a flat bed without using bedrails?: A Little Help needed moving to and from a bed to a chair (including a wheelchair)?: A Little Help needed standing up from a chair using your arms (e.g., wheelchair or bedside chair)?: A Little Help needed to walk in hospital room?: A Lot Help needed climbing 3-5 steps with a railing? : A Lot 6 Click Score: 16    End of Session Equipment Utilized During Treatment: Gait belt Activity Tolerance: Patient limited by fatigue Patient left: in chair;with call bell/phone within reach;with chair alarm set Nurse Communication: Mobility status PT Visit Diagnosis: Difficulty in walking, not elsewhere classified (R26.2);History of falling (Z91.81)    Time: 1610-9604 PT Time Calculation (min) (ACUTE ONLY): 22 min   Charges:   PT Evaluation $PT Eval Low Complexity: 1 Low         Blondell Reveal Kistler PT 07/05/2020  Acute Rehabilitation Services Pager [phone] Office  [phone]

## 2020-07-05 NOTE — Consult Note (Addendum)
    Chief complaint: Infected Port-A-Cath  This is an unfortunate 70 year old with a history of multiple medical issues including metastatic bladder cancer who is immunocompromised and on chemotherapy.  He has been seen by infectious disease who request his Port-A-Cath be removed  The Port-A-Cath was placed by interventional radiology in 2018.    Currently, his platelet count is only 9K.  He would be too high risk for extensive bleeding to remove the Port-A-Cath currently.  He would need his platelets to at least be close to 40 K to consider the procedure.  I would also not recommend this be done over the weekend with limited access to the operating room and access to emergent blood products.  Diagnosis: Infected Port-A-Cath, thrombocytopenia  We will see him again on Monday to readdress the situation.

## 2020-07-06 DIAGNOSIS — I482 Chronic atrial fibrillation, unspecified: Secondary | ICD-10-CM | POA: Diagnosis not present

## 2020-07-06 DIAGNOSIS — C679 Malignant neoplasm of bladder, unspecified: Secondary | ICD-10-CM | POA: Diagnosis not present

## 2020-07-06 DIAGNOSIS — R7881 Bacteremia: Secondary | ICD-10-CM | POA: Diagnosis not present

## 2020-07-06 DIAGNOSIS — D638 Anemia in other chronic diseases classified elsewhere: Secondary | ICD-10-CM | POA: Diagnosis not present

## 2020-07-06 DIAGNOSIS — C772 Secondary and unspecified malignant neoplasm of intra-abdominal lymph nodes: Secondary | ICD-10-CM | POA: Diagnosis not present

## 2020-07-06 DIAGNOSIS — A498 Other bacterial infections of unspecified site: Secondary | ICD-10-CM | POA: Diagnosis not present

## 2020-07-06 DIAGNOSIS — T451X5A Adverse effect of antineoplastic and immunosuppressive drugs, initial encounter: Secondary | ICD-10-CM

## 2020-07-06 DIAGNOSIS — N185 Chronic kidney disease, stage 5: Secondary | ICD-10-CM | POA: Diagnosis not present

## 2020-07-06 DIAGNOSIS — D701 Agranulocytosis secondary to cancer chemotherapy: Secondary | ICD-10-CM | POA: Diagnosis not present

## 2020-07-06 LAB — CBC WITH DIFFERENTIAL/PLATELET
HCT: 22.7 % — ABNORMAL LOW (ref 39.0–52.0)
Hemoglobin: 7.6 g/dL — ABNORMAL LOW (ref 13.0–17.0)
MCH: 32.5 pg (ref 26.0–34.0)
MCHC: 33.5 g/dL (ref 30.0–36.0)
MCV: 97 fL (ref 80.0–100.0)
Platelets: 5 10*3/uL — CL (ref 150–400)
RBC: 2.34 MIL/uL — ABNORMAL LOW (ref 4.22–5.81)
RDW: 13.6 % (ref 11.5–15.5)
WBC: <0.1 10*3/uL — CL (ref 4.0–10.5)
nRBC: 0 % (ref 0.0–0.2)

## 2020-07-06 LAB — COMPREHENSIVE METABOLIC PANEL
ALT: 25 U/L (ref 0–44)
AST: 14 U/L — ABNORMAL LOW (ref 15–41)
Albumin: 2.2 g/dL — ABNORMAL LOW (ref 3.5–5.0)
Alkaline Phosphatase: 29 U/L — ABNORMAL LOW (ref 38–126)
Anion gap: 9 (ref 5–15)
BUN: 92 mg/dL — ABNORMAL HIGH (ref 8–23)
CO2: 15 mmol/L — ABNORMAL LOW (ref 22–32)
Calcium: 8.1 mg/dL — ABNORMAL LOW (ref 8.9–10.3)
Chloride: 106 mmol/L (ref 98–111)
Creatinine, Ser: 6.71 mg/dL — ABNORMAL HIGH (ref 0.61–1.24)
GFR, Estimated: 8 mL/min — ABNORMAL LOW (ref >60)
Glucose, Bld: 135 mg/dL — ABNORMAL HIGH (ref 70–99)
Potassium: 3.9 mmol/L (ref 3.5–5.1)
Sodium: 130 mmol/L — ABNORMAL LOW (ref 135–145)
Total Bilirubin: 1 mg/dL (ref 0.3–1.2)
Total Protein: 5 g/dL — ABNORMAL LOW (ref 6.5–8.1)

## 2020-07-06 LAB — PHOSPHORUS: Phosphorus: 4.3 mg/dL (ref 2.5–4.6)

## 2020-07-06 LAB — MAGNESIUM: Magnesium: 1.9 mg/dL (ref 1.7–2.4)

## 2020-07-06 MED ORDER — STERILE WATER FOR INJECTION IV SOLN
INTRAVENOUS | Status: DC
  Filled 2020-07-06: qty 850
  Filled 2020-07-06: qty 150
  Filled 2020-07-06 (×2): qty 850
  Filled 2020-07-06: qty 150

## 2020-07-06 MED ORDER — VANCOMYCIN 50 MG/ML ORAL SOLUTION
125.0000 mg | Freq: Four times a day (QID) | ORAL | Status: DC
  Administered 2020-07-06 – 2020-07-14 (×25): 125 mg via ORAL
  Filled 2020-07-06 (×34): qty 2.5

## 2020-07-06 MED ORDER — SODIUM CHLORIDE 0.9% IV SOLUTION
Freq: Once | INTRAVENOUS | Status: AC
  Administered 2020-07-06: 1000 mL via INTRAVENOUS

## 2020-07-06 MED ORDER — SODIUM CHLORIDE 0.9 % IV BOLUS
1000.0000 mL | Freq: Once | INTRAVENOUS | Status: AC
  Administered 2020-07-06: 1000 mL via INTRAVENOUS

## 2020-07-06 MED ORDER — SODIUM BICARBONATE 650 MG PO TABS
1300.0000 mg | ORAL_TABLET | Freq: Two times a day (BID) | ORAL | Status: AC
  Administered 2020-07-06 – 2020-07-07 (×4): 1300 mg via ORAL
  Filled 2020-07-06 (×4): qty 2

## 2020-07-06 MED ORDER — GERHARDT'S BUTT CREAM
TOPICAL_CREAM | Freq: Four times a day (QID) | CUTANEOUS | Status: DC
  Administered 2020-07-06 – 2020-07-07 (×4): 1 via TOPICAL
  Filled 2020-07-06 (×4): qty 1

## 2020-07-06 NOTE — Progress Notes (Signed)
HEMATOLOGY-ONCOLOGY PROGRESS NOTE  SUBJECTIVE: Severe and profound diarrhea almost 10-20 times last night according to the patient.  Couple of times every hour sometimes. Because of sepsis from Pseudomonas, I infectious disease recommended removal of the port.  But because of severe thrombocytopenia surgery cannot remove it at this time.  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 (HCC)  09/29/2016 Initial Diagnosis   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 ([place])   08/02/2017 - 10/10/2017 Chemotherapy   The patient had dexamethasone (DECADRON) 4 MG tablet, 8 mg, Oral, 2 times daily, 1 of 1 cycle, Start date: 08/02/2017, End date: 10/29/2017 pegfilgrastim-cbqv [facility]) injection 6 mg, 6 mg, Subcutaneous, Once, 2 of 2 cycles Administration: 6 mg (08/04/2017), 6 mg (08/25/2017) DOCEtaxel (TAXOTERE) 210 mg in sodium chloride 0.9 % 500 mL chemo infusion, 80 mg/m2 = 210 mg (80 % of original dose 100 mg/m2), Intravenous,  Once, 4 of 6 cycles Dose modification: 80 mg/m2 (80 % of original dose 100 mg/m2, Cycle 1, Reason: Provider Judgment) Administration: 210 mg (08/02/2017), 210 mg (08/23/2017), 210 mg (09/20/2017), 210 mg (10/10/2017)  for chemotherapy treatment.    11/10/2017 - 06/13/2018 Chemotherapy   The patient had dexamethasone (DECADRON) 4 MG tablet, 1 of 1 cycle, Start date: 11/10/2017, End date: 07/10/2018 DOXOrubicin (ADRIAMYCIN) chemo injection 60 mg, 24 mg/m2 = 60 mg (80 % of original dose 30 mg/m2), Intravenous,  Once, 8 of 12 cycles Dose modification: 24 mg/m2 (80 % of original dose 30 mg/m2, Cycle 1, Reason: Provider Judgment) Administration: 60 mg (11/11/2017), 60 mg (12/06/2017), 60 mg (01/10/2018), 60 mg (02/07/2018), 60 mg (03/14/2018), 60 mg (04/18/2018), 60 mg (05/16/2018), 60 mg (06/13/2018) palonosetron (ALOXI) injection 0.25 mg, 0.25 mg,  Intravenous,  Once, 8 of 12 cycles Administration: 0.25 mg (11/11/2017), 0.25 mg (12/06/2017), 0.25 mg (01/10/2018), 0.25 mg (02/07/2018), 0.25 mg (03/14/2018), 0.25 mg (04/18/2018), 0.25 mg (05/16/2018), 0.25 mg (06/13/2018) pegfilgrastim (NEULASTA ONPRO KIT) injection 6 mg, 6 mg, Subcutaneous, Once, 8 of 12 cycles Administration: 6 mg (11/11/2017), 6 mg (12/06/2017), 6 mg (01/10/2018), 6 mg (02/07/2018), 6 mg (03/14/2018), 6 mg (04/18/2018), 6 mg (05/16/2018), 6 mg (06/13/2018) methotrexate (PF) 75 mg in sodium chloride 0.9 % 50 mL chemo infusion, 75.25 mg, Intravenous,  Once, 8 of 12 cycles Administration: 75 mg (11/10/2017), 75 mg (12/05/2017), 75 mg (01/09/2018), 75 mg (02/06/2018), 75 mg (03/13/2018), 75 mg (04/17/2018), 75 mg (05/15/2018), 75 mg (06/12/2018) CISplatin (PLATINOL) 141 mg in sodium chloride 0.9 % 500 mL chemo infusion, 56 mg/m2 = 141 mg (80 % of original dose 70 mg/m2), Intravenous,  Once, 8 of 12 cycles Dose modification: 56 mg/m2 (80 % of original dose 70 mg/m2, Cycle 1, Reason: Provider Judgment) Administration: 141 mg (11/11/2017), 141 mg (12/06/2017), 141 mg (01/10/2018), 141 mg (02/07/2018), 141 mg (03/14/2018), 141 mg (04/18/2018), 141 mg (05/16/2018), 141 mg (06/13/2018) vinBLAStine (VELBAN) 5 mg in sodium chloride 0.9 % 50 mL chemo infusion, 2 mg/m2 = 5 mg (100 % of original dose 2 mg/m2), Intravenous, Once, 8 of 12 cycles Dose modification: 2 mg/m2 (original dose 2 mg/m2, Cycle 1, Reason: Provider Judgment), 5 mg (original dose 3 mg/m2, Cycle 6, Reason: Provider Judgment), 2.4 mg/m2 (80 % of original dose 3 mg/m2, Cycle 8, Reason: Provider Judgment) Administration: 5 mg (11/11/2017), 5 mg (12/06/2017), 5 mg (01/10/2018), 5 mg (02/07/2018), 5 mg (03/14/2018), 5 mg (04/18/2018), 5 mg (05/16/2018),  4 mg (06/13/2018) fosaprepitant (EMEND) 150 mg, dexamethasone (DECADRON) 12 mg in sodium chloride 0.9 % 145 mL IVPB, , Intravenous,  Once, 8 of 12 cycles Administration:  (11/11/2017),  (12/06/2017),  (01/10/2018),  (02/07/2018),   (03/14/2018),  (04/18/2018),  (05/16/2018),  (06/13/2018)  for chemotherapy treatment.    07/17/2018 - 08/28/2018 Chemotherapy   The patient had dexamethasone (DECADRON) 4 MG tablet, 8 mg, Oral, Daily, 1 of 1 cycle, Start date: 07/17/2018, End date: 10/14/2018 palonosetron (ALOXI) injection 0.25 mg, 0.25 mg, Intravenous,  Once, 2 of 4 cycles Administration: 0.25 mg (07/17/2018), 0.25 mg (07/24/2018), 0.25 mg (07/31/2018), 0.25 mg (08/14/2018), 0.25 mg (08/21/2018), 0.25 mg (08/28/2018) enfortumab vedotin-ejfv (PADCEV) 125 mg in sodium chloride 0.9 % 50 mL (2 mg/mL) chemo infusion, 125 mg (100 % of original dose 125 mg), Intravenous,  Once, 2 of 4 cycles Dose modification: 125 mg (original dose 125 mg, Cycle 1, Reason: Provider Judgment), 100 mg (80 % of original dose 125 mg, Cycle 2, Reason: Provider Judgment) Administration: 125 mg (07/24/2018), 125 mg (07/31/2018), 100 mg (08/14/2018), 100 mg (08/21/2018), 100 mg (08/28/2018)  for chemotherapy treatment.    04/17/2019 - 11/29/2019 Chemotherapy   The patient had dexamethasone (DECADRON) 4 MG tablet, 8 mg, Oral, Daily, 1 of 1 cycle, Start date: 11/28/2019, End date: 12/21/2019 palonosetron (ALOXI) injection 0.25 mg, 0.25 mg, Intravenous,  Once, 9 of 10 cycles Administration: 0.25 mg (04/17/2019), 0.25 mg (04/19/2019), 0.25 mg (05/07/2019), 0.25 mg (05/09/2019), 0.25 mg (05/28/2019), 0.25 mg (05/30/2019), 0.25 mg (06/18/2019), 0.25 mg (06/20/2019), 0.25 mg (07/09/2019), 0.25 mg (07/11/2019), 0.25 mg (07/30/2019), 0.25 mg (08/01/2019), 0.25 mg (10/02/2019), 0.25 mg (10/30/2019), 0.25 mg (11/01/2019), 0.25 mg (11/27/2019), 0.25 mg (11/29/2019) pegfilgrastim (NEULASTA ONPRO KIT) injection 6 mg, 6 mg, Subcutaneous, Once, 9 of 10 cycles Administration: 6 mg (04/19/2019), 6 mg (05/09/2019), 6 mg (05/30/2019), 6 mg (06/20/2019), 6 mg (07/11/2019), 6 mg (08/01/2019), 6 mg (11/01/2019), 6 mg (11/29/2019) gemcitabine (GEMZAR) 1,254 mg in sodium chloride 0.9 % 250 mL chemo infusion, 500 mg/m2 = 1,254 mg (50 % of original  dose 1,000 mg/m2), Intravenous,  Once, 9 of 10 cycles Dose modification: 500 mg/m2 (50 % of original dose 1,000 mg/m2, Cycle 1, Reason: Provider Judgment) Administration: 1,254 mg (04/17/2019), 1,254 mg (05/07/2019), 1,254 mg (05/28/2019), 1,254 mg (06/18/2019), 1,254 mg (07/09/2019), 1,254 mg (07/30/2019), 1,254 mg (10/02/2019), 1,254 mg (10/30/2019), 1,254 mg (11/27/2019) fosaprepitant (EMEND) 150 mg in sodium chloride 0.9 % 145 mL IVPB, 150 mg, Intravenous,  Once, 9 of 10 cycles Administration: 150 mg (04/18/2019), 150 mg (05/08/2019), 150 mg (05/29/2019), 150 mg (06/19/2019), 150 mg (07/10/2019), 150 mg (07/31/2019), 150 mg (10/03/2019), 150 mg (10/31/2019), 150 mg (11/28/2019) mesna (MESNEX) 1,000 mg in sodium chloride 0.9 % 100 mL infusion, 400 mg/m2 = 1,000 mg, Intravenous,  Once, 9 of 10 cycles Administration: 1,000 mg (04/17/2019), 1,000 mg (04/18/2019), 1,000 mg (04/19/2019), 1,000 mg (05/07/2019), 1,000 mg (05/08/2019), 1,000 mg (05/09/2019), 1,000 mg (05/28/2019), 1,000 mg (05/29/2019), 1,000 mg (05/30/2019), 1,000 mg (06/18/2019), 1,000 mg (06/19/2019), 1,000 mg (06/20/2019), 1,000 mg (07/09/2019), 1,000 mg (07/10/2019), 1,000 mg (07/11/2019), 1,000 mg (07/30/2019), 1,000 mg (07/31/2019), 1,000 mg (08/01/2019), 1,000 mg (10/02/2019), 1,000 mg (10/03/2019), 1,000 mg (10/30/2019), 1,000 mg (10/31/2019), 1,000 mg (11/01/2019), 1,000 mg (11/27/2019), 1,000 mg (11/28/2019), 1,000 mg (11/29/2019) ifosfamide (IFEX) 3,700 mg, mesna (MESNEX) 3,700 mg in sodium chloride 0.9 % 150 mL chemo infusion, , Intravenous,  Once, 9 of 10 cycles Administration:  (04/17/2019),  (04/18/2019),  (04/19/2019),  (05/07/2019),  (05/08/2019),  (05/09/2019),  (05/28/2019),  (05/29/2019),  (  05/30/2019),  (06/18/2019),  (06/19/2019),  (06/20/2019),  (07/09/2019),  (07/10/2019),  (07/11/2019),  (07/30/2019),  (07/31/2019),  (08/01/2019),  (10/02/2019),  (10/03/2019),  (10/30/2019),  (10/31/2019),  (11/01/2019),  (11/27/2019),  (11/28/2019),  (11/29/2019)  for chemotherapy treatment.    12/31/2019 -   Chemotherapy    Patient is on Treatment Plan: BREAST METASTATIC SACITUZUMAB GOVITECAN-HZIY ([place]) Q21D        OBJECTIVE: REVIEW OF SYSTEMS:   Severe diarrhea Axillary cellulitis Generalized fatigue and weakness  PHYSICAL EXAMINATION: ECOG PERFORMANCE STATUS: 2 - Symptomatic, <50% confined to bed  Vitals:   07/05/20 2030 07/06/20 0555  BP: 120/73 140/67  Pulse: (!) 107 (!) 105  Resp: 20 20  Temp: 97.7 F (36.5 C) 97.6 F (36.4 C)  SpO2: 100% 100%   Filed Weights   07/03/20 0006 07/05/20 0500 07/06/20 0555  Weight: 256 lb 2.8 oz (116.2 kg) 254 lb 3.1 oz (115.3 kg) 251 lb 8.7 oz (114.1 kg)     LABORATORY DATA:  I have reviewed the data as listed CMP Latest Ref Rng & Units 07/06/2020 07/05/2020 07/05/2020  Glucose 70 - 99 mg/dL 135(H) 158(H) 162(H)  BUN 8 - 23 mg/dL 92(H) 89(H) 86(H)  Creatinine 0.61 - 1.24 mg/dL 6.71(H) 6.77(H) 5.91(H)  Sodium 135 - 145 mmol/L 130(L) 129(L) 127(L)  Potassium 3.5 - 5.1 mmol/L 3.9 3.2(L) 3.2(L)  Chloride 98 - 111 mmol/L 106 104 103  CO2 22 - 32 mmol/L 15(L) 13(L) 14(L)  Calcium 8.9 - 10.3 mg/dL 8.1(L) 7.7(L) 7.7(L)  Total Protein 6.5 - 8.1 g/dL 5.0(L) 5.4(L) 5.4(L)  Total Bilirubin 0.3 - 1.2 mg/dL 1.0 1.0 0.7  Alkaline Phos 38 - 126 U/L 29(L) 32(L) 32(L)  AST 15 - 41 U/L 14(L) 15 15  ALT 0 - 44 U/L _0 Lab Results  Component Value Date   WBC <0.1 (LL) 07/06/2020   HGB 7.6 (L) 07/06/2020   HCT 22.7 (L) 07/06/2020   MCV 97.0 07/06/2020   PLT 5 (LL) 07/06/2020   NEUTROABS Not Measured 07/06/2020    ASSESSMENT AND PLAN: 1.  Pseudomonas sepsis: In the setting of severe neutropenia: Currently on cefepime. Infectious disease recommendation is to remove the port. We will try to transfuse him platelets hopefully today and tomorrow to get his platelet numbers of about 40.  He is currently afebrile.  2. on 07/03/2020: C. difficile was +1/3 samples, rest of the GI panel was negative I would recommend covering for C. difficile  with Flagyl. This is because of his profound diarrhea. I will defer this to infectious diseases to make the decision. I discussed with Dr. Shelton Silvas.  3.  Acute renal failure: Creatinine is 6.71 with a BUN of 22.  Nephrology is seeing the patient. 4.  Metastatic high-grade bladder cancer: Received Trodelvy on 06/23/2020 cycle 3 Under the care of [name].

## 2020-07-06 NOTE — Progress Notes (Signed)
PROGRESS NOTE    AUTHOR HATLESTAD  YKZ:[id number] DOB: 08/22/1950 DOA: 07/02/2020 PCP: Shirline Frees, MD   Brief Narrative:  The patient is a 70 year old obese Caucasian male with a past medical history significant for but not limited to bladder cancer, chronic atrial fibrillation, CKD stage V, history of chronic anemia secondary to chronic kidney disease, hypertension as well as other comorbidities who presented via EMS for generalized weakness.  He reportedly was at home became very weak and was not able to stand and was at his desk and was unable to get up to go to the bedroom to lay down and EMS was called.  He states that he is globally weak and did not notice any difference in the strength left right extremities.  He stated that he felt better laying down and states he been eating and drinking but reports that it is less than last few days.  He was started on new medication by his nephrologist but does not know what it was.  He is currently on chemotherapy for his bladder cancer last chemo dose was on 06/30/2020 with Ivette Loyal.  Further work-up revealed the patient was pancytopenic on admission and significantly neutropenic.  He was transfused platelets in the ED given his thrombocytopenia secondary to his mild nosebleed and hematology oncology was consulted and recommending transfusion of PRBCs now given his low blood count as well.  He is currently on anticoagulation for his chronic atrial fibrillation.  Patient's work-up has shown that he was septic secondary to a Pseudomonas bacteremia now and he has been placed on double Pseudomonas coverage with ciprofloxacin and cefepime by heme-onc but will get infectious diseases to adjust his antibiotic regimen and evaluate.  Nephrology was also consulted given his worsening renal function in the setting of his diarrhea.  His renal function is improving with IV fluid hydration.  His white blood cell count continues to be extremely low and his platelet count has  dropped to 9 now but he continues to receive G-CSF.  His metabolic acidosis is worsening so will defer to nephrology for assistance.  He was found to have nontoxigenic C. difficile consistent with colonization in the setting of his diarrhea but since it is worsened we will start empirically treat given his continued significant bowel movements.  Infectious diseases has narrowed his antibiotics just to cefepime now and recommending having his Port-A-Cath removed.  General surgery was consulted for Port-A-Cath removal and they are recommending they will do this once his platelet count is higher.  He continues to have volume loss with his diarrhea and nephrology rebolusing 1 L normal saline again today increase in IV fluids to 125 mL's per hour and adding p.o. 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 (HCC)   Thrombocytopenia (HCC)   Essential hypertension   Chronic atrial fibrillation (HCC)   Neutropenia (HCC)   CKD (chronic kidney disease) stage 5, GFR less than 15 ml/min (HCC)   Weakness generalized   Anemia of chronic disease  Pancytopenia secondary to Antineoplastic Therapy -Patient presented with a neutropenia, thrombocytopenia and anemia; see below -He was evaluated by Oncolocgy; status post day 8 of Trodelvy on 06/10/2020 and received G-CSF with chemo and they feel that his WBC will slowly recover the next 2 days -He is severely neutropenic and they feel he has an infection in the left axilla concerning for early cellulitis but due to severe neutropenia medical oncology  has started the patient on IV cefepime -Patient's WBC is now <0.1 still, Hgb/Hct is slightly improved to 8.3/24.6, and Platelet Count is 5 -Will Type and Screen and Transfuse 2 units of Pooled Platelets  -Oncology starting the patient on Granix  -Further care per medical oncology -Continues to get Tbo-Filgrastim 480 mcg   Neutropenia   -Mr. Wherry is admitted to telemetry floor. Profound neutropenia which is due to chemotherapy but now has a Pseudomonas Bacteremia. Last chemotherapy was on 06/30/20.  -Oncology consulted and recommending Abx and feel his WBC will recover slowly in the next few days -No fever. There were No signs of infection initially but it seems like he has a Cellulitis in the Left Axilla. Blood cultures obtained in ER and will monitor. -Patient's WBC has gone from 7.8 -> 3.6 -> 0.1 and is now <0.1 Again today for the 4th day in a row -Continue to Monitor and Trend -Repeat CBC in the AM  Sepsis 2/2 Pseudomonas Bacteremia and Left Axilla Cellulitis, poA -Presented with Sepsis Physiology and was Tachycardic, Tachypenic, Neutropenic and has a source of Infection int he Blood and Cellulitis  -Patient's Blood Cx 1/4 Grew out Pseudomonas -Repeat Blood Cx -On IV Cefepime and Heme/Onc added IV Ciprofloxacin; ID has narrowed just to Cefepime -Patient has a Port and likely will need to be removed -Given IVF with NS received a bolus yesterday and will get a bolus today;  -U/A done and showed a clear appearance with 150 glucose, large hemoglobin, 100 protein, rare bacteria, greater than 50 RBCs per high-power field and urine WBCs 0-5 -ID Consulted for further evaluation I appreciate Dr. Storm Frisk evaluation; ID recommends removal of Port-A-Cath and treating with IV Cefepime monotherapy -We will start po Vancomycin   -General Surgery consulted for further Evaluation for Removal of Port-A-Cath  Diarrhea in the setting of C Diff, poA and worsening -Has had multiple Bowel Movements -C Diff Ag + but Has Negative C Diff Toxin and Negative Toxogenic C Diff via PCR -GI Pathogen Panel Negative -Patient has nontoxigenic C. difficile and is colonization and ID recommending treating if only becomes worsened and since it has worsened will start po Vancomycin Tx Dose -Will not place Flexiseal given Neutropenia   Bladder  Cancer metastasized to intra-abdominal lymph nodes -Pt is followed by oncology and on chemotherapy. -See Above Heme/Onc following   Thrombocytopenia -Platelet count less than 20,000 with mild nose bleed and hematuria. Platelet transfusion ordered by ER.  -Platelet Count went from 115 -> 37 -> 17 -> 24 (after Transfusion) -> 12 -> 9 -> 5 -Will Transfuse 2 packs of Platelets  -Followed by Heme/Onc. -Continue to Monitor for S/Sx of Bleeding -Repeat CBC in tehAM   Essential Hypertension -Stable. Pt currently off all antihypertensives.  -Continue to Monitor BP per Protocol -Last BP was 133/78  Chronic Atrial Fibrillation  -Chronic. Anticoagulation held with profound thrombocytopenia until Platelet Count is at least >50,000 -Continue to Monitor on Telemetry  AKI on CKD (chronic kidney disease) stage 5, GFR less than 15 ml/min  Metabolic Acidosis, worsening  -Now worsening in the setting of Diarrhea -Patient's BUN/Cr went from 62/6.42 -> 82/6.00 -> 96/6.35 -> 96/6.88 -> 86/5.91 -> 92/6.71 -Patient's Acidosis is improving and now CO2 is 15, AG is 9, Chlorde Level is 106 -Avoid Nephrotoxic Medications, Contrast Dyes, Hypotension and Renally Adjust Medications -Nephrology consulted for further evaluation and recommendations and patient has no uremic signs and nephrology feels that he is a little bit dry; Fluid Management per Nephrology  Recc's; Patient to get another 1 Liter bolus and will be given Sodium Bicarbonate gtt at 125 mL/hr now and also will get Sodium Bicarbonate Tab 1300 mg po BID  -Appreciate nephrology assistance and will follow the recommendations  Hypomagnesemia -Patient's Mag Level is now 1.9 -Repelte with IV Mag Sulfate 2 Grams yesterday  -Continue to Monitor and Replete as Necessary -Repeat Mag Level in the AM  Hypokalemia -Patient's K+ was 3.9 this AM -Replete with po KCl 40 mEQ x1 yesterday  -Continue to Monitor and Replete as Necessary -Repeat CMP in the AM    Generalized Debility and Weakness  -Consult PT for evaluation.  -C/w Fall precautions.  Hyponatremia -Paitent's Na+ is 131 and dropped to 129 and is now 127 this AM  -Continue to Monitor and trend -Given a 1000 mL bolus and will get another today per nephrology  -Nephrology adjusting IVF  -Repeat CMP in the AM  Anemia of Chronic disease/Macrocytic Anemia -Patient's Hgb/Hct went from 10.2/31.1 -> 9.4/28.8 -> 8.6/25.8 -> 7.7/23.4 -> 7.5/22.0 -> 8.3/24.6 -> 7.6/22.7 -Heme/Onc will give 1 unit of pRBCs to keep Hgb/Hct >8 -Continue to Monitor for S/Sx of Bleeding; Had some Hematuria (U/A) and Epistaxis  -Repeat CBC in the AM  Obesity -Complicates overall prognosis and Care -Estimated body mass index is 32.3 kg/m as calculated from the following:   Height as of this encounter: $RemoveBeforeD'6\' 2"'[id number]$  (1.88 m).   Weight as of this encounter: 114.1 kg. -Weight Loss and Dietary Counseling given  -Nutritionist consulted and recommending Beneprotein TIDwm and Prosource Plus po BID  DVT prophylaxis: SCDs given Anemia and Thrombocytopenia  Code Status: FULL CODE Family Communication: No family present at bedside  Disposition Plan: Pending further Clinical Workup; PT/OT recommending home Health PT  Status is: Inpatient  Remains inpatient appropriate because:Unsafe d/c plan, IV treatments appropriate due to intensity of illness or inability to take PO and Inpatient level of care appropriate due to severity of illness   Dispo: The patient is from: Home               Anticipated d/c is to: Home              Patient currently is medically stable to d/c.   Difficult to place patient No  Consultants:   Medical Oncology  Nephrology  Infectious Diseases   General Surgery    Procedures: None  Antimicrobials:  Anti-infectives (From admission, onward)   Start     Dose/Rate Route Frequency Ordered Stop   07/06/20 1000  vancomycin (VANCOCIN) 50 mg/mL oral solution 125 mg        125 mg Oral 4  times daily 07/06/20 0858 07/16/20 0959   07/04/20 1000  ciprofloxacin (CIPRO) IVPB 400 mg  Status:  Discontinued        400 mg 200 mL/hr over 60 Minutes Intravenous Every 24 hours 07/04/20 0848 07/05/20 1550   07/03/20 1300  ceFEPIme (MAXIPIME) 2 g in sodium chloride 0.9 % 100 mL IVPB        2 g 200 mL/hr over 30 Minutes Intravenous Every 24 hours 07/03/20 1153          Subjective: Seen and examined at bedside and continues to have profuse significant diarrhea.  Denies any abdominal pain or nausea or vomiting.  States he feels about the same but had hiccups this morning.  No other concerns or complaints at this time.  Objective: Vitals:   07/05/20 1434 07/05/20 2030 07/06/20 0555 07/06/20 1330  BP: 114/63  120/73 140/67 133/78  Pulse: 85 (!) 107 (!) 105 80  Resp: $Remo'17 20 20 'vvDqK$ (!) 22  Temp: 97.9 F (36.6 C) 97.7 F (36.5 C) 97.6 F (36.4 C) 97.8 F (36.6 C)  TempSrc: Oral     SpO2: 100% 100% 100% 100%  Weight:   114.1 kg   Height:        Intake/Output Summary (Last 24 hours) at 07/06/2020 1355 Last data filed at 07/06/2020 1205 Gross per 24 hour  Intake 3202.71 ml  Output 2000 ml  Net 1202.71 ml   Filed Weights   07/03/20 0006 07/05/20 0500 07/06/20 0555  Weight: 116.2 kg 115.3 kg 114.1 kg   Examination: Physical Exam:  Constitutional: WN/WD Caucasian male currently no acute distress appears calm with mildly uncomfortable with hiccoughs  Eyes: Lids and conjunctivae normal, sclerae anicteric  ENMT: External Ears, Nose appear normal. Grossly normal hearing.  Neck: Appears normal, supple, no cervical masses, normal ROM, no appreciable thyromegaly; no JVD Respiratory: Diminished to auscultation bilaterally with coarse breath sounds, no wheezing, rales, rhonchi or crackles. Normal respiratory effort and patient is not tachypenic. No accessory muscle use. Unlabored breathing   Cardiovascular: RRR, no murmurs / rubs / gallops. S1 and S2 auscultated. Mild 1+ LE edema Abdomen:  Soft, non-tender, Distended 2/2 body habitus.  Bowel sounds positive.  GU: Deferred. Musculoskeletal: No clubbing / cyanosis of digits/nails. No joint deformity upper and lower extremities.  Skin: No rashes, lesions, ulcers on a limited skin evaluation. No induration; Warm and dry.  Neurologic: CN 2-12 grossly intact with no focal deficits. Romberg sign and cerebellar reflexes not assessed.  Psychiatric: Normal judgment and insight. Alert and oriented x 3. Normal mood and appropriate affect.   Data Reviewed: I have personally reviewed following labs and imaging studies  CBC: Recent Labs  Lab 06/30/20 1030 07/03/20 0009 07/03/20 0757 07/04/20 0537 07/05/20 1054 07/06/20 0528  WBC 3.6* 0.1* <0.1* <0.1* <0.1* <0.1*  NEUTROABS 2.8 0.0*  --   --   --  Not Measured  HGB 9.4* 8.6* 7.7* 7.5* 8.3* 7.6*  HCT 28.8* 25.8* 23.4* 22.0* 24.6* 22.7*  MCV 100.3* 98.5 100.4* 96.9 97.6 97.0  PLT 37* 17* 24* 12* 9* 5*   Basic Metabolic Panel: Recent Labs  Lab 06/30/20 1030 07/03/20 0009 07/04/20 0537 07/05/20 1054 07/06/20 0528  NA 131* 131* 129* 129*  127* 130*  K 3.9 4.2 3.9 3.2*  3.2* 3.9  CL 101 104 102 104  103 106  CO2 19* 16* 17* 13*  14* 15*  GLUCOSE 167* 109* 128* 158*  162* 135*  BUN 82* 96* 96* 89*  86* 92*  CREATININE 6.00* 6.35* 6.88* 6.77*  5.91* 6.71*  CALCIUM 8.6* 7.9* 7.5* 7.7*  7.7* 8.1*  MG 1.6*  --  1.4* 1.7  1.7 1.9  PHOS 4.4  --  4.9* 4.2  4.1 4.3   GFR: Estimated Creatinine Clearance: 14.2 mL/min (A) (by C-G formula based on SCr of 6.71 mg/dL (H)). Liver Function Tests: Recent Labs  Lab 06/30/20 1030 07/03/20 0009 07/04/20 0537 07/05/20 1054 07/06/20 0528  AST $Re'20 27 18 15  15 'Wek$ 14*  ALT 21 34 $Rem'26 23  22 25  'ITyC$ ALKPHOS 68 54 34* 32*  32* 29*  BILITOT 0.6 1.0 0.9 1.0  0.7 1.0  PROT 6.6 6.3* 5.4* 5.4*  5.4* 5.0*  ALBUMIN 3.8 3.3* 2.6* 2.5*  2.5* 2.2*   No results for input(s): LIPASE, AMYLASE in the last 168 hours. No results for input(s):  AMMONIA in the last 168 hours. Coagulation Profile: No results for input(s): INR, PROTIME in the last 168 hours. Cardiac Enzymes: No results for input(s): CKTOTAL, CKMB, CKMBINDEX, TROPONINI in the last 168 hours. BNP (last 3 results) No results for input(s): PROBNP in the last 8760 hours. HbA1C: No results for input(s): HGBA1C in the last 72 hours. CBG: No results for input(s): GLUCAP in the last 168 hours. Lipid Profile: No results for input(s): CHOL, HDL, LDLCALC, TRIG, CHOLHDL, LDLDIRECT in the last 72 hours. Thyroid Function Tests: No results for input(s): TSH, T4TOTAL, FREET4, T3FREE, THYROIDAB in the last 72 hours. Anemia Panel: No results for input(s): VITAMINB12, FOLATE, FERRITIN, TIBC, IRON, RETICCTPCT in the last 72 hours. Sepsis Labs: Recent Labs  Lab 07/03/20 0132  LATICACIDVEN 1.8    Recent Results (from the past 240 hour(s))  Blood culture (routine x 2)     Status: None (Preliminary result)   Collection Time: 07/03/20  1:28 AM   Specimen: BLOOD  Result Value Ref Range Status   Specimen Description   Final    BLOOD RIGHT ANTECUBITAL Performed at [facility] [address], [place], [place] [address]    Special Requests   Final    BOTTLES DRAWN AEROBIC AND ANAEROBIC Blood Culture results may not be optimal due to an excessive volume of blood received in culture bottles Performed at [facility] [address] [place], [place] [address]    Culture   Final    NO GROWTH 2 DAYS Performed at [facility] [address], [place], [place] [address]    Report Status PENDING  Incomplete  Blood culture (routine x 2)     Status: Abnormal   Collection Time: 07/03/20  1:33 AM   Specimen: BLOOD  Result Value Ref Range Status   Specimen Description   Final    BLOOD RIGHT HAND Performed at [facility] [address], [place], [place] [address]    Special Requests   Final    BOTTLES DRAWN AEROBIC AND  ANAEROBIC Blood Culture results may not be optimal due to an excessive volume of blood received in culture bottles Performed at [facility] [address], [place], [place] [address]    Culture  Setup Time   Final    AEROBIC BOTTLE ONLY GRAM NEGATIVE RODS CRITICAL RESULT CALLED TO, READ BACK BY AND VERIFIED WITHSeleta Rhymes [facility] 07/04/20 0252 JDW Performed at [facility], [place] [address], [place], [place] [address]    Culture PSEUDOMONAS AERUGINOSA (A)  Final   Report Status 07/05/2020 FINAL  Final   Organism ID, Bacteria PSEUDOMONAS AERUGINOSA  Final      Susceptibility   Pseudomonas aeruginosa - MIC*    CEFTAZIDIME 4 SENSITIVE Sensitive     CIPROFLOXACIN <=0.25 SENSITIVE Sensitive     GENTAMICIN <=1 SENSITIVE Sensitive     IMIPENEM 2 SENSITIVE Sensitive     PIP/TAZO 8 SENSITIVE Sensitive     CEFEPIME 2 SENSITIVE Sensitive     * PSEUDOMONAS AERUGINOSA  Resp Panel by RT-PCR (Flu A&B, Covid) Nasopharyngeal Swab     Status: None   Collection Time: 07/03/20  1:33 AM   Specimen: Nasopharyngeal Swab; Nasopharyngeal(NP) swabs in vial transport medium  Result Value Ref Range Status   SARS Coronavirus 2 by RT PCR NEGATIVE NEGATIVE Final    Comment: (NOTE) SARS-CoV-2 target nucleic acids are NOT DETECTED.  The SARS-CoV-2 RNA is generally detectable in upper respiratory  specimens during the acute phase of infection. The lowest concentration of SARS-CoV-2 viral copies this assay can detect is 138 copies/mL. A negative result does not preclude SARS-Cov-2 infection and should not be used as the sole basis for treatment or other patient management decisions. A negative result may occur with  improper specimen collection/handling, submission of specimen other than nasopharyngeal swab, presence of viral mutation(s) within the areas targeted by this assay, and inadequate number of viral copies(<138 copies/mL). A negative result must be combined with clinical  observations, patient history, and epidemiological information. The expected result is Negative.  Fact Sheet for Patients:  EntrepreneurPulse.com.au  Fact Sheet for Healthcare Providers:  IncredibleEmployment.be  This test is no t yet approved or cleared by the [place] FDA and  has been authorized for detection and/or diagnosis of SARS-CoV-2 by FDA under an Emergency Use Authorization (EUA). This EUA will remain  in effect (meaning this test can be used) for the duration of the COVID-19 declaration under Section 564(b)(1) of the Act, 21 U.S.C.section 360bbb-3(b)(1), unless the authorization is terminated  or revoked sooner.       Influenza A by PCR NEGATIVE NEGATIVE Final   Influenza B by PCR NEGATIVE NEGATIVE Final    Comment: (NOTE) The Xpert Xpress SARS-CoV-2/FLU/RSV plus assay is intended as an aid in the diagnosis of influenza from Nasopharyngeal swab specimens and should not be used as a sole basis for treatment. Nasal washings and aspirates are unacceptable for Xpert Xpress SARS-CoV-2/FLU/RSV testing.  Fact Sheet for Patients: EntrepreneurPulse.com.au  Fact Sheet for Healthcare Providers: IncredibleEmployment.be  This test is not yet approved or cleared by the [place] FDA and has been authorized for detection and/or diagnosis of SARS-CoV-2 by FDA under an Emergency Use Authorization (EUA). This EUA will remain in effect (meaning this test can be used) for the duration of the COVID-19 declaration under Section 564(b)(1) of the Act, 21 U.S.C. section 360bbb-3(b)(1), unless the authorization is terminated or revoked.  Performed at [facility], [place] [address] [place], [place] [address]   Blood Culture ID Panel (Reflexed)     Status: Abnormal   Collection Time: 07/03/20  1:33 AM  Result Value Ref Range Status   Enterococcus faecalis NOT DETECTED NOT DETECTED Final    Enterococcus Faecium NOT DETECTED NOT DETECTED Final   Listeria monocytogenes NOT DETECTED NOT DETECTED Final   Staphylococcus species NOT DETECTED NOT DETECTED Final   Staphylococcus aureus (BCID) NOT DETECTED NOT DETECTED Final   Staphylococcus epidermidis NOT DETECTED NOT DETECTED Final   Staphylococcus lugdunensis NOT DETECTED NOT DETECTED Final   Streptococcus species NOT DETECTED NOT DETECTED Final   Streptococcus agalactiae NOT DETECTED NOT DETECTED Final   Streptococcus pneumoniae NOT DETECTED NOT DETECTED Final   Streptococcus pyogenes NOT DETECTED NOT DETECTED Final   A.calcoaceticus-baumannii NOT DETECTED NOT DETECTED Final   Bacteroides fragilis NOT DETECTED NOT DETECTED Final   Enterobacterales NOT DETECTED NOT DETECTED Final   Enterobacter cloacae complex NOT DETECTED NOT DETECTED Final   Escherichia coli NOT DETECTED NOT DETECTED Final   Klebsiella aerogenes NOT DETECTED NOT DETECTED Final   Klebsiella oxytoca NOT DETECTED NOT DETECTED Final   Klebsiella pneumoniae NOT DETECTED NOT DETECTED Final   Proteus species NOT DETECTED NOT DETECTED Final   Salmonella species NOT DETECTED NOT DETECTED Final   Serratia marcescens NOT DETECTED NOT DETECTED Final   Haemophilus influenzae NOT DETECTED NOT DETECTED Final   Neisseria meningitidis NOT DETECTED NOT DETECTED Final   Pseudomonas aeruginosa DETECTED (  A) NOT DETECTED Final    Comment: CRITICAL RESULT CALLED TO, READ BACK BY AND VERIFIED WITH: E JACKSON PHARMD 07/04/20 0252 JDW    Stenotrophomonas maltophilia NOT DETECTED NOT DETECTED Final   Candida albicans NOT DETECTED NOT DETECTED Final   Candida auris NOT DETECTED NOT DETECTED Final   Candida glabrata NOT DETECTED NOT DETECTED Final   Candida krusei NOT DETECTED NOT DETECTED Final   Candida parapsilosis NOT DETECTED NOT DETECTED Final   Candida tropicalis NOT DETECTED NOT DETECTED Final   Cryptococcus neoformans/gattii NOT DETECTED NOT DETECTED Final   CTX-M ESBL  NOT DETECTED NOT DETECTED Final   Carbapenem resistance IMP NOT DETECTED NOT DETECTED Final   Carbapenem resistance KPC NOT DETECTED NOT DETECTED Final   Carbapenem resistance NDM NOT DETECTED NOT DETECTED Final   Carbapenem resistance VIM NOT DETECTED NOT DETECTED Final    Comment: Performed at [facility], [address] [place], [place] [address]  C Difficile Quick Screen w PCR reflex     Status: Abnormal   Collection Time: 07/03/20  7:01 PM   Specimen: STOOL  Result Value Ref Range Status   C Diff antigen POSITIVE (A) NEGATIVE Final   C Diff toxin NEGATIVE NEGATIVE Final   C Diff interpretation Results are indeterminate. See PCR results.  Final    Comment: Performed at [facility], [address], [place], [place] [address]  Gastrointestinal Panel by PCR , Stool     Status: None   Collection Time: 07/03/20  7:01 PM   Specimen: STOOL  Result Value Ref Range Status   Campylobacter species NOT DETECTED NOT DETECTED Final   Plesimonas shigelloides NOT DETECTED NOT DETECTED Final   Salmonella species NOT DETECTED NOT DETECTED Final   Yersinia enterocolitica NOT DETECTED NOT DETECTED Final   Vibrio species NOT DETECTED NOT DETECTED Final   Vibrio cholerae NOT DETECTED NOT DETECTED Final   Enteroaggregative E coli (EAEC) NOT DETECTED NOT DETECTED Final   Enteropathogenic E coli (EPEC) NOT DETECTED NOT DETECTED Final   Enterotoxigenic E coli (ETEC) NOT DETECTED NOT DETECTED Final   Shiga like toxin producing E coli (STEC) NOT DETECTED NOT DETECTED Final   Shigella/Enteroinvasive E coli (EIEC) NOT DETECTED NOT DETECTED Final   Cryptosporidium NOT DETECTED NOT DETECTED Final   Cyclospora cayetanensis NOT DETECTED NOT DETECTED Final   Entamoeba histolytica NOT DETECTED NOT DETECTED Final   Giardia lamblia NOT DETECTED NOT DETECTED Final   Adenovirus F40/41 NOT DETECTED NOT DETECTED Final   Astrovirus NOT DETECTED NOT DETECTED Final   Norovirus GI/GII NOT  DETECTED NOT DETECTED Final   Rotavirus A NOT DETECTED NOT DETECTED Final   Sapovirus (I, II, IV, and V) NOT DETECTED NOT DETECTED Final    Comment: Performed at [facility], [address] [place], [place] [address]  C. Diff by PCR, Reflexed     Status: None   Collection Time: 07/03/20  7:01 PM  Result Value Ref Range Status   Toxigenic C. Difficile by PCR NEGATIVE NEGATIVE Final    Comment: Patient is colonized with non toxigenic C. difficile. May not need treatment unless significant symptoms are present. Performed at [facility], [address], [place], [place] [address]   Culture, blood (routine x 2)     Status: None (Preliminary result)   Collection Time: 07/04/20  9:43 AM   Specimen: BLOOD LEFT HAND  Result Value Ref Range Status   Specimen Description   Final  BLOOD LEFT HAND Performed at [facility] [address] [place] [address] [place], [place] [address]    Special Requests   Final    BOTTLES DRAWN AEROBIC ONLY Blood Culture adequate volume Performed at [facility] [address] [place], [place] [address]    Culture   Final    NO GROWTH 1 DAY Performed at [facility], [place] [address], [place], [place] [address]    Report Status PENDING  Incomplete  Culture, blood (routine x 2)     Status: None (Preliminary result)   Collection Time: 07/04/20  9:43 AM   Specimen: BLOOD LEFT HAND  Result Value Ref Range Status   Specimen Description   Final    BLOOD LEFT HAND Performed at [facility] [address], [place], [place] [address]    Special Requests   Final    BOTTLES DRAWN AEROBIC ONLY Blood Culture adequate volume Performed at [facility] [address], [place], [place] [address]    Culture   Final    NO GROWTH 1 DAY Performed at [facility], [place] [address], [place],  [address]    Report Status PENDING  Incomplete     RN Pressure Injury  Documentation:     Estimated body mass index is 32.3 kg/m as calculated from the following:   Height as of this encounter: '6\' 2"'$  (1.88 m).   Weight as of this encounter: 114.1 kg.  Malnutrition Type:  Nutrition Problem: Increased nutrient needs Etiology: cancer and cancer related treatments   Malnutrition Characteristics:  Signs/Symptoms: estimated needs  Nutrition Interventions:  Interventions: Prostat,Refer to RD note for recommendations   Radiology Studies: US RENAL  Result Date: 07/04/2020 CLINICAL DATA:  Acute renal failure superimposed on stage 5 chronic kidney disease. History of bladder tumor post change urethral resection. EXAM: RENAL / URINARY TRACT ULTRASOUND COMPLETE COMPARISON:  Head CT 06/04/2020 FINDINGS: Right Kidney: Renal measurements: 11.3 x 4.8 x 5.5 cm = volume: 156 mL. Thinning of the renal parenchyma with increased echogenicity. No hydronephrosis. No evidence of focal lesion. Left Kidney: Renal measurements: 12.9 x 5.5 x 5.0 cm = volume: 171 mL. Thinning of the renal parenchyma with increased echogenicity. No hydronephrosis. No evidence of focal renal lesion. Bladder: Physiologically distended. Area of slightly lobulated echogenic bladder wall thickening is seen about the right bladder wall. No vascularity. Other: None. IMPRESSION: 1. No obstructive uropathy. 2. Bilateral renal parenchymal thinning and increased echogenicity consistent with chronic medical renal disease. 3. Echogenic bladder wall thickening in the right bladder, corresponding to that seen on prior PET CT, at site of hypermetabolism prior PET. Electronically Signed   By: Keith Rake M.D.   On: 07/04/2020 17:36   Scheduled Meds: . (feeding supplement) PROSource Plus  30 mL Oral BID BM  . Chlorhexidine Gluconate Cloth  6 each Topical Daily  . DULoxetine  60 mg Oral Daily  . Gerhardt's butt cream   Topical QID  . protein supplement  1 Scoop Oral TID WC  . sodium bicarbonate  1,300 mg Oral BID   . sodium chloride flush  10-40 mL Intracatheter Q12H  . Tbo-Filgrastim  480 mcg Subcutaneous q1800  . vancomycin  125 mg Oral QID   Continuous Infusions: . ceFEPime (MAXIPIME) IV Stopped (07/05/20 1454)  .  sodium bicarbonate (isotonic) infusion in sterile water      LOS: 3 days   Kerney Elbe, DO [facility] PAGER is on [employer]  If 7PM-7AM, please contact night-coverage [email or web address]

## 2020-07-06 NOTE — Progress Notes (Signed)
[facility] Progress Note  Subjective: pt seen in room, creat stable. Having lots of diarrhea w/ sig volume. BP's normal to low normal.   Vitals:   07/05/20 1211 07/05/20 1434 07/05/20 2030 07/06/20 0555  BP:  114/63 120/73 140/67  Pulse:  85 (!) 107 (!) 105  Resp:  _0 Temp:  97.9 F (36.6 C) 97.7 F (36.5 C) 97.6 F (36.4 C)  TempSrc:  Oral    SpO2: 100% 100% 100% 100%  Weight:    114.1 kg  Height:        Exam:   alert, nad   no jvd  Chest cta bilat  Cor reg no RG  Abd soft ntnd no ascites   Ext no LE edema, livedo pattern   Alert, NF, ox3    Date                           Creat               eGFR   2011- 2016                0.7- 1.3   2018                          0.9- 1.6   2019                          1.0- 1.6   Jan -May 2020          1.40- 2.30   Late 2020                  1.40- 1.80        AKI 4.5 > 1.66, AKI 4.0 > 2.60   Jan -Sept 2021         1.9- 2.5   Oct -Dec 2021           2.4- 4.6            AKI 9.8 > 4.6 at dc   Feb 2022                   5.85   March 7                     6.27   March 14                   6.42   March 21- 25, 2022   6.00- 6.88   Home meds:  - amiodroane 200 qd/ eliquis 5 bid/ lipitor 40/ lasix 40 q mwf/ metoprolol 25 bid/ klor-con 20 qd/ sod bicarb 1300 bid  - pepcid 20 bid  - prn's/ vitamins/ supplements     UA 3/23 - clear 150 glu, large Hb, 100 prot, rare bact, >50 rbc, 0-5 wbc   cdif Ag +, toxin neg   CXR - IMPRESSION: No active disease.    BCID +pseudomonas aeruginosa / blood cx's 1 of 2 + for pseudomonas    Assessment/ Plan: 1. AKI on CKD V - baseline creat 5.8- 6.2.  Here w/ gram-neg sepsis and neutropenia related to chemoRx. BP's wnl. Creat stable 6.5- 6.8 range here, good UOP, no gross uremic signs. Possibly this is new baseline renal function. Having vol loss w/ diarrhea, ^IVF to 125/hr and rebolus 1 L NS again today. No indication  for RRT at this time. Will follow.  2. Diarrhea - per  pmd 3. Metabolic acidosis - ^'d bicarb gtt to 125/hr and adding po bicarb for 2-3 days 4. Neutropenic fevers - +pseudomonas bacteremia. Per pmd, on IV abx and IV granix.  Per ID will need port removed when plt count is up.  5. Bladder cancer - per ONC, has been getting active chemoRx 6. Atrial fib - eliquis on hold w/ low plts. Afib here w/ stable vent rate 90- 100's w/ home BB/ amio on hold. Per primary team 7. Thrombocytopenia - per pmd 8. Anemia - d/t chemoRx + CKD. Tranfuse prn.  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 (feeding supplement) PROSource Plus  30 mL Oral BID BM  . sodium chloride   Intravenous Once  . Chlorhexidine Gluconate Cloth  6 each Topical Daily  . DULoxetine  60 mg Oral Daily  . Gerhardt's butt cream   Topical QID  . protein supplement  1 Scoop Oral TID WC  . sodium bicarbonate  1,300 mg Oral BID  . sodium chloride flush  10-40 mL Intracatheter Q12H  . Tbo-Filgrastim  480 mcg Subcutaneous q1800  . vancomycin  125 mg Oral QID   . ceFEPime (MAXIPIME) IV Stopped (07/05/20 1454)  .  sodium bicarbonate (isotonic) infusion in sterile water    . sodium chloride     acetaminophen **OR** acetaminophen, ondansetron **OR** ondansetron (ZOFRAN) IV, sodium chloride flush

## 2020-07-06 NOTE — Progress Notes (Signed)
[facility] for Infectious Disease    Date of Admission:  07/02/2020   Total days of antibiotics 4           ID: [name] Rose is a 70 y.o. 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 Not any abdominal cramping. Receiving platelets  Denies fever, chills, nightsweats Medications:  . (feeding supplement) PROSource Plus  30 mL Oral BID BM  . Chlorhexidine Gluconate Cloth  6 each Topical Daily  . DULoxetine  60 mg Oral Daily  . Gerhardt's butt cream   Topical QID  . protein supplement  1 Scoop Oral TID WC  . sodium bicarbonate  1,300 mg Oral BID  . sodium chloride flush  10-40 mL Intracatheter Q12H  . Tbo-Filgrastim  480 mcg Subcutaneous q1800  . vancomycin  125 mg Oral QID    Objective: Vital signs in last 24 hours: Temp:  [97.5 F (36.4 C)-97.8 F (36.6 C)] 97.5 F (36.4 C) (03/27 1429) Pulse Rate:  [80-107] 98 (03/27 1429) Resp:  [18-22] 18 (03/27 1429) BP: (120-140)/(67-82) 125/82 (03/27 1429) SpO2:  [100 %] 100 % (03/27 1429) Weight:  [114.1 kg] 114.1 kg (03/27 0555)  Physical Exam  Constitutional: He is oriented to person, place, and time. He appears well-developed and well-nourished. No distress.  HENT:  Mouth/Throat: Oropharynx is clear and moist. No oropharyngeal exudate.  Cardiovascular: Normal rate, regular rhythm and normal heart sounds. Exam reveals no gallop and no friction rub.  No murmur heard.  Pulmonary/Chest: Effort normal and breath sounds normal. No respiratory distress. He has no wheezes.  Abdominal: Soft. Bowel sounds are normal. He exhibits no distension. There is no tenderness.  Lymphadenopathy:   He has no cervical adenopathy.  Neurological: He is alert and oriented to person, place, and time.  Skin: Skin is warm and dry. No rash noted. No erythema.  Psychiatric: He has a normal mood and affect. His behavior is normal.    Lab Results Recent Labs    07/05/20 1054 07/06/20 0528  WBC <0.1* <0.1*  HGB 8.3* 7.6*  HCT 24.6* 22.7*  NA 129*  127* 130*  K 3.2*  3.2* 3.9  CL 104  103 106  CO2 13*  14* 15*  BUN 89*  86* 92*  CREATININE 6.77*  5.91* 6.71*   Liver Panel Recent Labs    07/05/20 1054 07/06/20 0528  PROT 5.4*  5.4* 5.0*  ALBUMIN 2.5*  2.5* 2.2*  AST 15  15 14*  ALT $Re'23  22 25  'Xgc$ ALKPHOS 32*  32* 29*  BILITOT 1.0  0.7 1.0    Microbiology: 3/24 blood cx PsA in 1 set 3/24 blood cx NGTD Studies/Results: US RENAL  Result Date: 07/04/2020 CLINICAL DATA:  Acute renal failure superimposed on stage 5 chronic kidney disease. History of bladder tumor post change urethral resection. EXAM: RENAL / URINARY TRACT ULTRASOUND COMPLETE COMPARISON:  Head CT 06/04/2020 FINDINGS: Right Kidney: Renal measurements: 11.3 x 4.8 x 5.5 cm = volume: 156 mL. Thinning of the renal parenchyma with increased echogenicity. No hydronephrosis. No evidence of focal lesion. Left Kidney: Renal measurements: 12.9 x 5.5 x 5.0 cm = volume:  171 mL. Thinning of the renal parenchyma with increased echogenicity. No hydronephrosis. No evidence of focal renal lesion. Bladder: Physiologically distended. Area of slightly lobulated echogenic bladder wall thickening is seen about the right bladder wall. No vascularity. Other: None. IMPRESSION: 1. No obstructive uropathy. 2. Bilateral renal parenchymal thinning and increased echogenicity consistent with chronic medical renal disease. 3. Echogenic bladder wall thickening in the right bladder, corresponding to that seen on prior PET CT, at site of hypermetabolism prior PET. Electronically Signed   By: Keith Rake M.D.   On: 07/04/2020 17:36      Assessment/Plan: Pseudomonal bacteremia = continue on cefepime, will need removal of port once platelet improving  C.difficile diarrhea = agree with starting oral vancomycin to see if it makes any improvement for the patient. Physical exam is benign  Neutropenia/thrombocytopenia = continues on gcsf plus platelet transfusion  [facility] for Infectious Diseases Cell: [phone] Pager: [phone]  07/06/2020, 2:55 PM

## 2020-07-06 NOTE — Progress Notes (Signed)
   07/06/20 1353  Assess: MEWS Score  ECG Heart Rate (!) 102  Assess: MEWS Score  MEWS Temp 0  MEWS Systolic 0  MEWS Pulse 1  MEWS RR 1  MEWS LOC 0  MEWS Score 2  MEWS Score Color Yellow  Assess: if the MEWS score is Yellow or Red  Were vital signs taken at a resting state? Yes  Focused Assessment Change from prior assessment (see assessment flowsheet)  MEWS guidelines implemented *See Row Information* Yes  Treat  MEWS Interventions Other (Comment) (recievcing platelets)  Pain Scale 0-10  Pain Score Asleep  Diarrhea relieved by Nothing  Take Vital Signs  Increase Vital Sign Frequency  Yellow: Q 2hr X 2 then Q 4hr X 2, if remains yellow, continue Q 4hrs  Escalate  MEWS: Escalate Yellow: discuss with charge nurse/RN and consider discussing with provider and RRT  Notify: Charge Nurse/RN  Name of Charge Nurse/RN Notified Clotilde Dieter  Date Charge Nurse/RN Notified 07/06/20  Time Charge Nurse/RN Notified 1418  Notify: Provider  Provider Name/Title Dr. Alfredia Ferguson  Date Provider Notified 07/06/20  Time Provider Notified 1419  Notification Type Page  Notification Reason Other (Comment) (respiratory and pulse increased)  Provider response No new orders  Date of Provider Response 07/06/20  Time of Provider Response 1415  Document  Patient Outcome Other (Comment) (stable)  Progress note created (see row info) Yes

## 2020-07-07 ENCOUNTER — Inpatient Hospital Stay (HOSPITAL_COMMUNITY): Payer: PPO

## 2020-07-07 DIAGNOSIS — I482 Chronic atrial fibrillation, unspecified: Secondary | ICD-10-CM | POA: Diagnosis not present

## 2020-07-07 DIAGNOSIS — N185 Chronic kidney disease, stage 5: Secondary | ICD-10-CM | POA: Diagnosis not present

## 2020-07-07 DIAGNOSIS — C679 Malignant neoplasm of bladder, unspecified: Secondary | ICD-10-CM | POA: Diagnosis not present

## 2020-07-07 DIAGNOSIS — L0291 Cutaneous abscess, unspecified: Secondary | ICD-10-CM

## 2020-07-07 DIAGNOSIS — K521 Toxic gastroenteritis and colitis: Secondary | ICD-10-CM | POA: Diagnosis not present

## 2020-07-07 DIAGNOSIS — D701 Agranulocytosis secondary to cancer chemotherapy: Secondary | ICD-10-CM | POA: Diagnosis not present

## 2020-07-07 DIAGNOSIS — B965 Pseudomonas (aeruginosa) (mallei) (pseudomallei) as the cause of diseases classified elsewhere: Secondary | ICD-10-CM

## 2020-07-07 DIAGNOSIS — D638 Anemia in other chronic diseases classified elsewhere: Secondary | ICD-10-CM | POA: Diagnosis not present

## 2020-07-07 DIAGNOSIS — L02412 Cutaneous abscess of left axilla: Secondary | ICD-10-CM

## 2020-07-07 DIAGNOSIS — T451X5A Adverse effect of antineoplastic and immunosuppressive drugs, initial encounter: Secondary | ICD-10-CM | POA: Diagnosis not present

## 2020-07-07 LAB — BPAM PLATELET PHERESIS
Blood Product Expiration Date: 202203292359
Blood Product Expiration Date: 202203292359
ISSUE DATE / TIME: 202203271340
ISSUE DATE / TIME: 202203271540
Unit Type and Rh: 600
Unit Type and Rh: 6200

## 2020-07-07 LAB — BASIC METABOLIC PANEL
Anion gap: 12 (ref 5–15)
BUN: 95 mg/dL — ABNORMAL HIGH (ref 8–23)
CO2: 18 mmol/L — ABNORMAL LOW (ref 22–32)
Calcium: 7.7 mg/dL — ABNORMAL LOW (ref 8.9–10.3)
Chloride: 103 mmol/L (ref 98–111)
Creatinine, Ser: 6.3 mg/dL — ABNORMAL HIGH (ref 0.61–1.24)
GFR, Estimated: 9 mL/min — ABNORMAL LOW (ref >60)
Glucose, Bld: 146 mg/dL — ABNORMAL HIGH (ref 70–99)
Potassium: 3 mmol/L — ABNORMAL LOW (ref 3.5–5.1)
Sodium: 133 mmol/L — ABNORMAL LOW (ref 135–145)

## 2020-07-07 LAB — PREPARE PLATELET PHERESIS
Unit division: 0
Unit division: 0

## 2020-07-07 LAB — COMPREHENSIVE METABOLIC PANEL
ALT: 26 U/L (ref 0–44)
AST: 15 U/L (ref 15–41)
Albumin: 2.1 g/dL — ABNORMAL LOW (ref 3.5–5.0)
Alkaline Phosphatase: 28 U/L — ABNORMAL LOW (ref 38–126)
Anion gap: 13 (ref 5–15)
BUN: 91 mg/dL — ABNORMAL HIGH (ref 8–23)
CO2: 17 mmol/L — ABNORMAL LOW (ref 22–32)
Calcium: 7.6 mg/dL — ABNORMAL LOW (ref 8.9–10.3)
Chloride: 102 mmol/L (ref 98–111)
Creatinine, Ser: 6.2 mg/dL — ABNORMAL HIGH (ref 0.61–1.24)
GFR, Estimated: 9 mL/min — ABNORMAL LOW (ref >60)
Glucose, Bld: 127 mg/dL — ABNORMAL HIGH (ref 70–99)
Potassium: 2.8 mmol/L — ABNORMAL LOW (ref 3.5–5.1)
Sodium: 132 mmol/L — ABNORMAL LOW (ref 135–145)
Total Bilirubin: 1 mg/dL (ref 0.3–1.2)
Total Protein: 4.8 g/dL — ABNORMAL LOW (ref 6.5–8.1)

## 2020-07-07 LAB — PREPARE RBC (CROSSMATCH)

## 2020-07-07 LAB — CBC WITH DIFFERENTIAL/PLATELET
HCT: 21.6 % — ABNORMAL LOW (ref 39.0–52.0)
Hemoglobin: 7.3 g/dL — ABNORMAL LOW (ref 13.0–17.0)
MCH: 32 pg (ref 26.0–34.0)
MCHC: 33.8 g/dL (ref 30.0–36.0)
MCV: 94.7 fL (ref 80.0–100.0)
Platelets: 13 10*3/uL — CL (ref 150–400)
RBC: 2.28 MIL/uL — ABNORMAL LOW (ref 4.22–5.81)
RDW: 13.7 % (ref 11.5–15.5)
WBC: <0.1 10*3/uL — CL (ref 4.0–10.5)
nRBC: 0 % (ref 0.0–0.2)

## 2020-07-07 LAB — MAGNESIUM: Magnesium: 1.7 mg/dL (ref 1.7–2.4)

## 2020-07-07 LAB — PHOSPHORUS: Phosphorus: 5.1 mg/dL — ABNORMAL HIGH (ref 2.5–4.6)

## 2020-07-07 MED ORDER — FUROSEMIDE 10 MG/ML IJ SOLN: 40.0000 mg | Freq: Once | INTRAMUSCULAR | Status: DC

## 2020-07-07 MED ORDER — SODIUM CHLORIDE 0.9% IV SOLUTION
Freq: Once | INTRAVENOUS | Status: AC
  Administered 2020-07-07: 500 mL via INTRAVENOUS

## 2020-07-07 MED ORDER — POTASSIUM CHLORIDE CRYS ER 20 MEQ PO TBCR
40.0000 meq | EXTENDED_RELEASE_TABLET | Freq: Once | ORAL | Status: AC
  Administered 2020-07-07: 40 meq via ORAL
  Filled 2020-07-07: qty 2

## 2020-07-07 MED ORDER — LACTATED RINGERS IV SOLN: INTRAVENOUS | Status: DC

## 2020-07-07 MED ORDER — POTASSIUM CHLORIDE 10 MEQ/50ML IV SOLN
10.0000 meq | INTRAVENOUS | Status: AC
  Administered 2020-07-07 (×4): 10 meq via INTRAVENOUS
  Filled 2020-07-07 (×4): qty 50

## 2020-07-07 MED ORDER — POTASSIUM CHLORIDE 10 MEQ/100ML IV SOLN: 10.0000 meq | INTRAVENOUS | Status: DC

## 2020-07-07 MED ORDER — MAGNESIUM SULFATE 2 GM/50ML IV SOLN
2.0000 g | Freq: Once | INTRAVENOUS | Status: AC
  Administered 2020-07-07: 2 g via INTRAVENOUS
  Filled 2020-07-07 (×2): qty 50

## 2020-07-07 MED ORDER — POTASSIUM CHLORIDE CRYS ER 20 MEQ PO TBCR
40.0000 meq | EXTENDED_RELEASE_TABLET | Freq: Every day | ORAL | Status: DC
  Administered 2020-07-07 – 2020-07-09 (×3): 40 meq via ORAL
  Filled 2020-07-07 (×3): qty 2

## 2020-07-07 MED ORDER — CHLORHEXIDINE GLUCONATE 0.12 % MT SOLN
15.0000 mL | Freq: Four times a day (QID) | OROMUCOSAL | Status: DC
  Administered 2020-07-07 – 2020-07-15 (×21): 15 mL via OROMUCOSAL
  Filled 2020-07-07 (×20): qty 15

## 2020-07-07 NOTE — Progress Notes (Signed)
   07/07/20 0607  Assess: MEWS Score  Temp 98 F (36.7 C)  BP (!) 120/108  Pulse Rate (!) 105  Resp 16  SpO2 100 %  O2 Device Nasal Cannula  O2 Flow Rate (L/min) 2 L/min  Assess: MEWS Score  MEWS Temp 0  MEWS Systolic 0  MEWS Pulse 1  MEWS RR 0  MEWS LOC 0  MEWS Score 1  MEWS Score Color Green  Assess: if the MEWS score is Yellow or Red  Were vital signs taken at a resting state? Yes  Focused Assessment No change from prior assessment  Early Detection of Sepsis Score *See Row Information* High  MEWS guidelines implemented *See Row Information* No, previously yellow, continue vital signs every 4 hours

## 2020-07-07 NOTE — Progress Notes (Signed)
PT Cancellation Note  Patient Details Name: JASHAUN PENROSE MRN: [id number] DOB: 03-29-1951   Cancelled Treatment:     Pt not feeling well today and declined any OOB activity.  Per chart review pt scheduled for [place] chest and receive blood.  Pt has been evaluated with rec for [facility] and will continue to follow during his acute stay.    Nathanial Rancher 07/07/2020, 11:34 AM

## 2020-07-07 NOTE — Progress Notes (Signed)
   07/07/20 0417  Assess: MEWS Score  Temp 97.7 F (36.5 C)  BP (!) 155/70  Pulse Rate (!) 109  Resp (!) 22  SpO2 100 %  Assess: MEWS Score  MEWS Temp 0  MEWS Systolic 0  MEWS Pulse 1  MEWS RR 1  MEWS LOC 0  MEWS Score 2  MEWS Score Color Yellow  Assess: if the MEWS score is Yellow or Red  Were vital signs taken at a resting state? Yes  Focused Assessment Change from prior assessment (see assessment flowsheet)  Early Detection of Sepsis Score *See Row Information* High  MEWS guidelines implemented *See Row Information* Yes  Treat  MEWS Interventions Administered scheduled meds/treatments  Pain Scale 0-10  Pain Score 0  Take Vital Signs  Increase Vital Sign Frequency  Yellow: Q 2hr X 2 then Q 4hr X 2, if remains yellow, continue Q 4hrs

## 2020-07-07 NOTE — Progress Notes (Signed)
[facility] Progress Note  Subjective: Patient continues to feel tired with no appetite.  Creatinine stable.  Diarrhea ongoing. Potassium very low today at 2.8.  Vitals:   07/07/20 0607 07/07/20 0855 07/07/20 1143 07/07/20 1346  BP: (!) 120/108 135/75 (!) 153/68 124/75  Pulse: (!) 105 (!) 106 (!) 105 100  Resp: '16 16 18 18  ' Temp: 98 F (36.7 C) 97.6 F (36.4 C) (!) 97.5 F (36.4 C)   TempSrc:  Oral Oral   SpO2: 100% 99% 100% 100%  Weight:      Height:        Exam: GEN: Chronically ill-appearing, lying in bed, nad ENT: no nasal discharge, mmm EYES: no scleral icterus, eomi CV: Tachycardic PULM: no iwob, bilateral chest rise ABD: NABS, non-distended SKIN: no rashes or jaundice EXT: no edema, warm and well perfused    Date                           Creat               eGFR   2011- 2016                0.7- 1.3   2018                          0.9- 1.6   2019                          1.0- 1.6   Jan -May 2020          1.40- 2.30   Late 2020                  1.40- 1.80        AKI 4.5 > 1.66, AKI 4.0 > 2.60   Jan -Sept 2021         1.9- 2.5   Oct -Dec 2021           2.4- 4.6            AKI 9.8 > 4.6 at dc   Feb 2022                   5.85   March 7                     6.27   March 14                   6.42   March 21- 25, 2022   6.00- 6.88   Home meds:  - amiodroane 200 qd/ eliquis 5 bid/ lipitor 40/ lasix 40 q mwf/ metoprolol 25 bid/ klor-con 20 qd/ sod bicarb 1300 bid  - pepcid 20 bid  - prn's/ vitamins/ supplements     UA 3/23 - clear 150 glu, large Hb, 100 prot, rare bact, >50 rbc, 0-5 wbc   cdif Ag +, toxin neg   CXR - IMPRESSION: No active disease.    BCID +pseudomonas aeruginosa / blood cx's 1 of 2 + for pseudomonas    Assessment/ Plan: 1. CKD V - baseline creat 5.8- 6.2 and creatinine has stayed near this baseline since admission.  Possibly slight worsening related to sepsis.  Patient follows with Dr. Royce Macadamia outpatient.  Plan is to initiate  dialysis when needed. AVF creation was delayed due to acute illness. No urgent  indication for HD at this time; trying to delay catheter placement given issues w/ bacteremia and neutropenia 2. Diarrhea/Dehydration. Diarrhea mgmt per primary. Switch IV bicarb to LR 125cc/hr 3. Non-Anion Gap Metabolic acidosis - 2/2 diarrhea improving. Switch to LR as above. Continue oral bicarbonate 4. Neutropenic fevers - +pseudomonas bacteremia. Per pmd, on IV abx and IV granix.  Holding off on port removal for now. 5. Bladder cancer - per ONC, has been getting active chemoRx 6. Atrial fib - Slightly tachy but stable. Mgmt per primary 7. Pancytopenia - per primary, transfusions PRN. Given no volume overload and prone to dehydration lasix is not necessary with transfusions and may exacerbate his hypokalemia. 8. Severe Hypokalemia: 2.8 today. IV and oral repletion. Standing oral K for now given ongoing diarrhea   Reesa Chew  07/07/2020, 3:09 PM   Recent Labs  Lab 07/06/20 0528 07/07/20 0506 07/07/20 1325  K 3.9 2.8* 3.0*  BUN 92* 91* 95*  CREATININE 6.71* 6.20* 6.30*  CALCIUM 8.1* 7.6* 7.7*  PHOS 4.3 5.1*  --   HGB 7.6* 7.3*  --    Inpatient medications: . (feeding supplement) PROSource Plus  30 mL Oral BID BM  . sodium chloride   Intravenous Once  . chlorhexidine  15 mL Mouth/Throat QID  . Chlorhexidine Gluconate Cloth  6 each Topical Daily  . DULoxetine  60 mg Oral Daily  . Gerhardt's butt cream   Topical QID  . potassium chloride  40 mEq Oral Daily  . protein supplement  1 Scoop Oral TID WC  . sodium bicarbonate  1,300 mg Oral BID  . sodium chloride flush  10-40 mL Intracatheter Q12H  . Tbo-Filgrastim  480 mcg Subcutaneous q1800  . vancomycin  125 mg Oral QID   . ceFEPime (MAXIPIME) IV 2 g (07/07/20 1344)  . lactated ringers     acetaminophen **OR** acetaminophen, ondansetron **OR** ondansetron (ZOFRAN) IV, sodium chloride flush

## 2020-07-07 NOTE — Progress Notes (Signed)
PROGRESS NOTE    [name] Rose  CWU:[id number] DOB: Apr 20, 1950 DOA: 07/02/2020 PCP: [name] Frees, MD   Brief Narrative:  The patient is a 70 year old obese Caucasian male with a past medical history significant for but not limited to bladder cancer, chronic atrial fibrillation, CKD stage V, history of chronic anemia secondary to chronic kidney disease, hypertension as well as other comorbidities who presented via EMS for generalized weakness.  He reportedly was at home became very weak and was not able to stand and was at his desk and was unable to get up to go to the bedroom to lay down and EMS was called.  He states that he is globally weak and did not notice any difference in the strength left right extremities.  He stated that he felt better laying down and states he been eating and drinking but reports that it is less than last few days.  He was started on new medication by his nephrologist but does not know what it was.  He is currently on chemotherapy for his bladder cancer last chemo dose was on 06/30/2020 with [name] Rose.  Further work-up revealed the patient was pancytopenic on admission and significantly neutropenic.  He was transfused platelets in the ED given his thrombocytopenia secondary to his mild nosebleed and hematology oncology was consulted and recommending transfusion of PRBCs now given his low blood count as well.  He is currently on anticoagulation for his chronic atrial fibrillation.  Patient's work-up has shown that he was septic secondary to a Pseudomonas bacteremia now and he has been placed on double Pseudomonas coverage with ciprofloxacin and cefepime by heme-onc but will get infectious diseases to adjust his antibiotic regimen and evaluate.  Nephrology was also consulted given his worsening renal function in the setting of his diarrhea.  His renal function is improving with IV fluid hydration.  His white blood cell count continues to be extremely low and his platelet count has  dropped to 9 now but he continues to receive G-CSF.  His metabolic acidosis is worsening so will defer to nephrology for assistance.  He was found to have nontoxigenic C. difficile consistent with colonization in the setting of his diarrhea but since it is worsened we will start empirically treat given his continued significant bowel movements.  Infectious diseases has narrowed his antibiotics just to cefepime now and recommending having his Port-A-Cath removed.  General surgery was consulted for Port-A-Cath removal and they are recommending they will do this once his platelet count is higher.  He continues to have volume loss with his diarrhea and nephrology rebolusing 1 L normal saline again yesterday and increased his IV fluids to 125 mL's per hour and adding p.o. bicarbonate for 2 to 3 days  Patient's Blood count has been slowly dropping and Platelet Count only improved to 13 yesterday from 5. Oncology transfusing 2 units of pRBC's today and may need Platelets as well. He continues to have significant diarrhea so his electrolytes decreased so will replete.   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 (HCC)   Thrombocytopenia (HCC)   Essential hypertension   Chronic atrial fibrillation (HCC)   Neutropenia (HCC)   CKD (chronic kidney disease) stage 5, GFR less than 15 ml/min (HCC)   Weakness generalized   Anemia of chronic disease  Pancytopenia secondary to Antineoplastic Therapy -Patient presented with a neutropenia, thrombocytopenia and anemia; see below -He was evaluated by Oncolocgy; status post day 8 of Trodelvy  on 06/10/2020 and received G-CSF with chemo and they feel that his WBC will slowly recover the next 2 days -He is severely neutropenic and they feel he has an infection in the left axilla concerning for early cellulitis but due to severe neutropenia medical oncology has started the patient on IV cefepime -Patient's WBC is now  <0.1 still for the 5th day in a row, Hgb/Hct is trended down to  7.3/21.6 today so will be transfused 2 units, and Platelet Count trended up to 13 from 5 after Transfusion of platelets yesterday so will likely transfuse again today  -Will Type and Screen and Transfuse 2 units of pRBC's and discuss with Oncology about Transfusion of Platelets  -Patient is already Transfused 3 units of Platelets this Admission and will be Transfused total of 3 units of pRBCs (Receiving 2 today) during this Hospitalization -Oncology starting the patient on Granix  -Further care per medical oncology -Continues to get Tbo-Filgrastim 480 mcg   Neutropenia  -[name] Rose is admitted to telemetry floor. Profound neutropenia which is due to chemotherapy but now has a Pseudomonas Bacteremia. Last chemotherapy was on 06/30/20.  -Oncology consulted and recommending Abx and feel his WBC will recover slowly in the next few days -No fever. There were No signs of infection initially but it seems like he has a Cellulitis in the Left Axilla. Blood cultures obtained in ER and will monitor. -Patient's WBC has gone from 7.8 -> 3.6 -> 0.1 and is now <0.1 Again today for the 5th day in a row -Continue to Monitor and Trend -Repeat CBC in the AM  Sepsis 2/2 Pseudomonas Bacteremia and Left Axilla Cellulitis, poA -Presented with Sepsis Physiology and was Tachycardic, Tachypenic, Neutropenic and has a source of Infection int he Blood and Cellulitis  -Patient's Blood Cx 1/4 Grew out Pseudomonas -Repeat Blood Cx -On IV Cefepime and Heme/Onc added IV Ciprofloxacin; ID has narrowed just to Cefepime -Patient has a Port and likely will need to be removed -Given IVF with NS received a bolus yesterday per Nephrology; Maintenance IVF per Nephrology  -U/A done and showed a clear appearance with 150 glucose, large hemoglobin, 100 protein, rare bacteria, greater than 50 RBCs per high-power field and urine WBCs 0-5 -ID Consulted for further  evaluation I appreciate Dr. Storm Rose evaluation; ID recommends removal of Port-A-Cath and treating with IV Cefepime monotherapy -We will start po Vancomycin for Treatment of C Diff -General Surgery consulted for further Evaluation for Removal of Port-A-Cath  Diarrhea in the setting of C Diff, poA and worsening -Has had multiple Bowel Movements -C Diff Ag + but Has Negative C Diff Toxin and Negative Toxogenic C Diff via PCR -GI Pathogen Panel Negative -Patient has nontoxigenic C. difficile and is colonization and ID recommending treating if only becomes worsened and since it has worsened will start po Vancomycin Tx Dose -Will not place Flexiseal given Neutropenia  -Continues to have Severe Diarrhea -C/w Supportive Care  Bladder Cancer metastasized to intra-abdominal lymph nodes -Pt is followed by oncology and on chemotherapy. -See Above Heme/Onc following   Thrombocytopenia -Platelet count less than 20,000 with mild nose bleed and hematuria. Platelet transfusion ordered by ER.  -Platelet Count went from 115 -> 37 -> 17 -> 24 (after Transfusion) -> 12 -> 9 -> 5 -> 13 (after Transfusion of 2 packs of Platelets) -Will Transfuse 2 packs of Platelets  -Followed by Heme/Onc. -Continue to Monitor for S/Sx of Bleeding -Repeat CBC in the AM   Essential Hypertension -Stable. Pt currently  off all antihypertensives.  -Continue to Monitor BP per Protocol -Last BP was 135/75  Chronic Atrial Fibrillation  -Chronic. Anticoagulation held with profound thrombocytopenia until Platelet Count is at least >50,000 -Continue to Monitor on Telemetry  AKI on CKD (chronic kidney disease) stage 5, GFR less than 15 ml/min  Metabolic Acidosis, slowly improving  Hyperphosphatemia -Now worsening in the setting of Diarrhea -Patient's BUN/Cr went from 62/6.42 -> 82/6.00 -> 96/6.35 -> 96/6.88 -> 86/5.91 -> 92/6.71 -> 91/6.20 -Patient's Acidosis is improving and now CO2 is 17, AG is 13, Chlorde Level is  102 -Patient's Phos Level was increased and is now 5.1 -Avoid Nephrotoxic Medications, Contrast Dyes, Hypotension and Renally Adjust Medications -Nephrology consulted for further evaluation and recommendations and patient has no uremic signs and nephrology feels that he is a little bit dry; Fluid Management per Nephrology Recc's; Patient to get another 1 Liter bolus and will be given Sodium Bicarbonate gtt at 125 mL/hr now and also will get Sodium Bicarbonate Tab 1300 mg po BID  -Appreciate nephrology assistance and will follow the recommendations  Hypomagnesemia -Patient's Mag Level is now 1.7 -Repelte with IV Mag Sulfate 2 Grams  -Continue to Monitor and Replete as Necessary -Repeat Mag Level in the AM  Hypokalemia -Patient's K+ was 2.8 this AM -Replete with po KCl 40 mEQ x1 and with IV KCl 40 mEQ x1 -Continue to Monitor and Replete as Necessary -Repeat CMP in the AM   Generalized Debility and Weakness  -Consult PT for evaluation.  -C/w Fall precautions.  Hyponatremia -Paitent's Na+ is now 132 -Continue to Monitor and trend -Nephrology adjusting IVF and appreciate their assistance -Repeat CMP in the AM  Anemia of Chronic disease/Macrocytic Anemia -Patient's Hgb/Hct went from 10.2/31.1 -> 9.4/28.8 -> 8.6/25.8 -> 7.7/23.4 -> 7.5/22.0 -> 8.3/24.6 -> 7.6/22.7 -> 7.3/21.6 -Heme/Onc will give 2 units of pRBCs to keep Hgb/Hct >8; Will be Transfused 3 units of pRBCs so far -Continue to Monitor for S/Sx of Bleeding; Had some Hematuria (U/A) and Epistaxis  -Repeat CBC in the AM  Obesity -Complicates overall prognosis and Care -Estimated body mass index is 32.3 kg/m as calculated from the following:   Height as of this encounter: $RemoveBeforeD'6\' 2"'[id number]$  (1.88 m).   Weight as of this encounter: 114.1 kg. -Weight Loss and Dietary Counseling given  -Nutritionist consulted and recommending Beneprotein TIDwm and Prosource Plus po BID  DVT prophylaxis: SCDs given Anemia and Thrombocytopenia  Code  Status: FULL CODE Family Communication: No family present at bedside  Disposition Plan: Pending further Clinical Workup and improvement and clearance by Oncology, ID, Nephrology and General Surgery; PT/OT recommending home Health PT  Status is: Inpatient  Remains inpatient appropriate because:Unsafe d/c plan, IV treatments appropriate due to intensity of illness or inability to take PO and Inpatient level of care appropriate due to severity of illness   Dispo: The patient is from: Home               Anticipated d/c is to: Home              Patient currently is medically stable to d/c.   Difficult to place patient No  Consultants:   Medical Oncology  Nephrology  Infectious Diseases   General Surgery    Procedures: None  Antimicrobials:  Anti-infectives (From admission, onward)   Start     Dose/Rate Route Frequency Ordered Stop   07/06/20 1000  vancomycin (VANCOCIN) 50 mg/mL oral solution 125 mg        125  mg Oral 4 times daily 07/06/20 0858 07/16/20 0959   07/04/20 1000  ciprofloxacin (CIPRO) IVPB 400 mg  Status:  Discontinued        400 mg 200 mL/hr over 60 Minutes Intravenous Every 24 hours 07/04/20 0848 07/05/20 1550   07/03/20 1300  ceFEPIme (MAXIPIME) 2 g in sodium chloride 0.9 % 100 mL IVPB        2 g 200 mL/hr over 30 Minutes Intravenous Every 24 hours 07/03/20 1153          Subjective: Seen and examined at bedside and he was doing ok and had no abdominal pain but continues to have significant diarrhea. Feels the same as yesterday. Was wearing Supplemental O2 via [place]. Denies any CP or SOB. No other concerns or complaints at this time and no family present at bedside.   Objective: Vitals:   07/07/20 0116 07/07/20 0417 07/07/20 0607 07/07/20 0855  BP: 117/75 (!) 155/70 (!) 120/108 135/75  Pulse: (!) 105 (!) 109 (!) 105 (!) 106  Resp: 16 (!) $Remo'22 16 16  'ZLSny$ Temp: 98.9 F (37.2 C) 97.7 F (36.5 C) 98 F (36.7 C) 97.6 F (36.4 C)  TempSrc:  Oral  Oral  SpO2: 100%  100% 100% 99%  Weight:      Height:        Intake/Output Summary (Last 24 hours) at 07/07/2020 0948 Last data filed at 07/07/2020 4970 Gross per 24 hour  Intake 2869.41 ml  Output 2902 ml  Net -32.59 ml   Filed Weights   07/03/20 0006 07/05/20 0500 07/06/20 0555  Weight: 116.2 kg 115.3 kg 114.1 kg   Examination: Physical Exam:  Constitutional: WN/WD Chronically ill appearing Caucasian male in NAD appears uncomfortable Eyes: Lids and conjunctivae normal, sclerae anicteric  ENMT: External Ears, Nose appear normal. Grossly normal hearing. Neck: Appears normal, supple, no cervical masses, normal ROM, no appreciable thyromegaly; no JVD Respiratory: Diminished to auscultation bilaterally with coarse breath sounds, no wheezing, rales, rhonchi or crackles. Normal respiratory effort and patient is not tachypenic. No accessory muscle use. Wearing supplemental O2 via [place].   Cardiovascular: RRR, no murmurs / rubs / gallops. S1 and S2 auscultated. Trace edema Abdomen: Soft, non-tender, Distended 2/2 body habitus. Bowel sounds positive.  GU: Deferred. Musculoskeletal: No clubbing / cyanosis of digits/nails. No joint deformity upper and lower extremities.  Skin: No rashes, lesions, ulcers on a limited skin evaluation. No induration; Warm and dry.  Neurologic: CN 2-12 grossly intact with no focal deficits. Romberg sign and cerebellar reflexes not assessed.  Psychiatric: Normal judgment and insight. Alert and oriented x 3. Normal mood and appropriate affect.    Data Reviewed: I have personally reviewed following labs and imaging studies  CBC: Recent Labs  Lab 06/30/20 1030 06/30/20 1030 07/03/20 0009 07/03/20 0757 07/04/20 0537 07/05/20 1054 07/06/20 0528 07/07/20 0506  WBC 3.6*   < > 0.1* <0.1* <0.1* <0.1* <0.1* <0.1*  NEUTROABS 2.8  --  0.0*  --   --   --  Not Measured  --   HGB 9.4*   < > 8.6* 7.7* 7.5* 8.3* 7.6* 7.3*  HCT 28.8*  --  25.8* 23.4* 22.0* 24.6* 22.7* 21.6*  MCV 100.3*  --   98.5 100.4* 96.9 97.6 97.0 94.7  PLT 37*   < > 17* 24* 12* 9* 5* 13*   < > = values in this interval not displayed.   Basic Metabolic Panel: Recent Labs  Lab 06/30/20 1030 07/03/20 0009 07/04/20 0537 07/05/20 1054 07/06/20  0528 07/07/20 0506  NA 131* 131* 129* 129*  127* 130* 132*  K 3.9 4.2 3.9 3.2*  3.2* 3.9 2.8*  CL 101 104 102 104  103 106 102  CO2 19* 16* 17* 13*  14* 15* 17*  GLUCOSE 167* 109* 128* 158*  162* 135* 127*  BUN 82* 96* 96* 89*  86* 92* 91*  CREATININE 6.00* 6.35* 6.88* 6.77*  5.91* 6.71* 6.20*  CALCIUM 8.6* 7.9* 7.5* 7.7*  7.7* 8.1* 7.6*  MG 1.6*  --  1.4* 1.7  1.7 1.9 1.7  PHOS 4.4  --  4.9* 4.2  4.1 4.3 5.1*   GFR: Estimated Creatinine Clearance: 15.3 mL/min (A) (by C-G formula based on SCr of 6.2 mg/dL (H)). Liver Function Tests: Recent Labs  Lab 07/03/20 0009 07/04/20 0537 07/05/20 1054 07/06/20 0528 07/07/20 0506  AST $Re'27 18 15  15 'aez$ 14* 15  ALT 34 $Remo'26 23  22 25 26  'XMxeu$ ALKPHOS 54 34* 32*  32* 29* 28*  BILITOT 1.0 0.9 1.0  0.7 1.0 1.0  PROT 6.3* 5.4* 5.4*  5.4* 5.0* 4.8*  ALBUMIN 3.3* 2.6* 2.5*  2.5* 2.2* 2.1*   No results for input(s): LIPASE, AMYLASE in the last 168 hours. No results for input(s): AMMONIA in the last 168 hours. Coagulation Profile: No results for input(s): INR, PROTIME in the last 168 hours. Cardiac Enzymes: No results for input(s): CKTOTAL, CKMB, CKMBINDEX, TROPONINI in the last 168 hours. BNP (last 3 results) No results for input(s): PROBNP in the last 8760 hours. HbA1C: No results for input(s): HGBA1C in the last 72 hours. CBG: No results for input(s): GLUCAP in the last 168 hours. Lipid Profile: No results for input(s): CHOL, HDL, LDLCALC, TRIG, CHOLHDL, LDLDIRECT in the last 72 hours. Thyroid Function Tests: No results for input(s): TSH, T4TOTAL, FREET4, T3FREE, THYROIDAB in the last 72 hours. Anemia Panel: No results for input(s): VITAMINB12, FOLATE, FERRITIN, TIBC, IRON, RETICCTPCT in the last 72  hours. Sepsis Labs: Recent Labs  Lab 07/03/20 0132  LATICACIDVEN 1.8    Recent Results (from the past 240 hour(s))  Blood culture (routine x 2)     Status: None (Preliminary result)   Collection Time: 07/03/20  1:28 AM   Specimen: BLOOD  Result Value Ref Range Status   Specimen Description   Final    BLOOD RIGHT ANTECUBITAL Performed at [facility] [address], [place], [place] [address]    Special Requests   Final    BOTTLES DRAWN AEROBIC AND ANAEROBIC Blood Culture results may not be optimal due to an excessive volume of blood received in culture bottles Performed at [facility] [address], [place], [place] [address]    Culture   Final    NO GROWTH 4 DAYS Performed at [facility], [place] [address], [place], [place] [address]    Report Status PENDING  Incomplete  Blood culture (routine x 2)     Status: Abnormal   Collection Time: 07/03/20  1:33 AM   Specimen: BLOOD  Result Value Ref Range Status   Specimen Description   Final    BLOOD RIGHT HAND Performed at [facility] [address], [place], [place] [address]    Special Requests   Final    BOTTLES DRAWN AEROBIC AND ANAEROBIC Blood Culture results may not be optimal due to an excessive volume of blood received in culture bottles Performed at [facility] [address], [place], [place] [address]  Culture  Setup Time   Final    AEROBIC BOTTLE ONLY GRAM NEGATIVE RODS CRITICAL RESULT CALLED TO, READ BACK BY AND VERIFIED WITHSeleta Rhymes [facility] 07/04/20 0252 JDW Performed at [facility], [place] [address], [place], [place] [address]    Culture PSEUDOMONAS AERUGINOSA (A)  Final   Report Status 07/05/2020 FINAL  Final   Organism ID, Bacteria PSEUDOMONAS AERUGINOSA  Final      Susceptibility   Pseudomonas aeruginosa - MIC*    CEFTAZIDIME 4 SENSITIVE Sensitive     CIPROFLOXACIN <=0.25 SENSITIVE Sensitive     GENTAMICIN <=1  SENSITIVE Sensitive     IMIPENEM 2 SENSITIVE Sensitive     PIP/TAZO 8 SENSITIVE Sensitive     CEFEPIME 2 SENSITIVE Sensitive     * PSEUDOMONAS AERUGINOSA  Resp Panel by RT-PCR (Flu A&B, Covid) Nasopharyngeal Swab     Status: None   Collection Time: 07/03/20  1:33 AM   Specimen: Nasopharyngeal Swab; Nasopharyngeal(NP) swabs in vial transport medium  Result Value Ref Range Status   SARS Coronavirus 2 by RT PCR NEGATIVE NEGATIVE Final    Comment: (NOTE) SARS-CoV-2 target nucleic acids are NOT DETECTED.  The SARS-CoV-2 RNA is generally detectable in upper respiratory specimens during the acute phase of infection. The lowest concentration of SARS-CoV-2 viral copies this assay can detect is 138 copies/mL. A negative result does not preclude SARS-Cov-2 infection and should not be used as the sole basis for treatment or other patient management decisions. A negative result may occur with  improper specimen collection/handling, submission of specimen other than nasopharyngeal swab, presence of viral mutation(s) within the areas targeted by this assay, and inadequate number of viral copies(<138 copies/mL). A negative result must be combined with clinical observations, patient history, and epidemiological information. The expected result is Negative.  Fact Sheet for Patients:  EntrepreneurPulse.com.au  Fact Sheet for Healthcare Providers:  IncredibleEmployment.be  This test is no t yet approved or cleared by the [place] FDA and  has been authorized for detection and/or diagnosis of SARS-CoV-2 by FDA under an Emergency Use Authorization (EUA). This EUA will remain  in effect (meaning this test can be used) for the duration of the COVID-19 declaration under Section 564(b)(1) of the Act, 21 U.S.C.section 360bbb-3(b)(1), unless the authorization is terminated  or revoked sooner.       Influenza A by PCR NEGATIVE NEGATIVE Final   Influenza B by PCR  NEGATIVE NEGATIVE Final    Comment: (NOTE) The Xpert Xpress SARS-CoV-2/FLU/RSV plus assay is intended as an aid in the diagnosis of influenza from Nasopharyngeal swab specimens and should not be used as a sole basis for treatment. Nasal washings and aspirates are unacceptable for Xpert Xpress SARS-CoV-2/FLU/RSV testing.  Fact Sheet for Patients: EntrepreneurPulse.com.au  Fact Sheet for Healthcare Providers: IncredibleEmployment.be  This test is not yet approved or cleared by the [place] FDA and has been authorized for detection and/or diagnosis of SARS-CoV-2 by FDA under an Emergency Use Authorization (EUA). This EUA will remain in effect (meaning this test can be used) for the duration of the COVID-19 declaration under Section 564(b)(1) of the Act, 21 U.S.C. section 360bbb-3(b)(1), unless the authorization is terminated or revoked.  Performed at [facility], [place] [address] [place], [place] [address]   Blood Culture ID Panel (Reflexed)     Status: Abnormal   Collection Time: 07/03/20  1:33 AM  Result Value Ref Range Status   Enterococcus faecalis NOT DETECTED NOT DETECTED Final  Enterococcus Faecium NOT DETECTED NOT DETECTED Final   Listeria monocytogenes NOT DETECTED NOT DETECTED Final   Staphylococcus species NOT DETECTED NOT DETECTED Final   Staphylococcus aureus (BCID) NOT DETECTED NOT DETECTED Final   Staphylococcus epidermidis NOT DETECTED NOT DETECTED Final   Staphylococcus lugdunensis NOT DETECTED NOT DETECTED Final   Streptococcus species NOT DETECTED NOT DETECTED Final   Streptococcus agalactiae NOT DETECTED NOT DETECTED Final   Streptococcus pneumoniae NOT DETECTED NOT DETECTED Final   Streptococcus pyogenes NOT DETECTED NOT DETECTED Final   A.calcoaceticus-baumannii NOT DETECTED NOT DETECTED Final   Bacteroides fragilis NOT DETECTED NOT DETECTED Final   Enterobacterales NOT DETECTED NOT DETECTED Final    Enterobacter cloacae complex NOT DETECTED NOT DETECTED Final   Escherichia coli NOT DETECTED NOT DETECTED Final   Klebsiella aerogenes NOT DETECTED NOT DETECTED Final   Klebsiella oxytoca NOT DETECTED NOT DETECTED Final   Klebsiella pneumoniae NOT DETECTED NOT DETECTED Final   Proteus species NOT DETECTED NOT DETECTED Final   Salmonella species NOT DETECTED NOT DETECTED Final   Serratia marcescens NOT DETECTED NOT DETECTED Final   Haemophilus influenzae NOT DETECTED NOT DETECTED Final   Neisseria meningitidis NOT DETECTED NOT DETECTED Final   Pseudomonas aeruginosa DETECTED (A) NOT DETECTED Final    Comment: CRITICAL RESULT CALLED TO, READ BACK BY AND VERIFIED WITH: E JACKSON PHARMD 07/04/20 0252 JDW    Stenotrophomonas maltophilia NOT DETECTED NOT DETECTED Final   Candida albicans NOT DETECTED NOT DETECTED Final   Candida auris NOT DETECTED NOT DETECTED Final   Candida glabrata NOT DETECTED NOT DETECTED Final   Candida krusei NOT DETECTED NOT DETECTED Final   Candida parapsilosis NOT DETECTED NOT DETECTED Final   Candida tropicalis NOT DETECTED NOT DETECTED Final   Cryptococcus neoformans/gattii NOT DETECTED NOT DETECTED Final   CTX-M ESBL NOT DETECTED NOT DETECTED Final   Carbapenem resistance IMP NOT DETECTED NOT DETECTED Final   Carbapenem resistance KPC NOT DETECTED NOT DETECTED Final   Carbapenem resistance NDM NOT DETECTED NOT DETECTED Final   Carbapenem resistance VIM NOT DETECTED NOT DETECTED Final    Comment: Performed at [facility], [address], [place], [place] [address]  C Difficile Quick Screen w PCR reflex     Status: Abnormal   Collection Time: 07/03/20  7:01 PM   Specimen: STOOL  Result Value Ref Range Status   C Diff antigen POSITIVE (A) NEGATIVE Final   C Diff toxin NEGATIVE NEGATIVE Final   C Diff interpretation Results are indeterminate. See PCR results.  Final    Comment: Performed at [facility] [place] [address]  [place], [place] [address]  Gastrointestinal Panel by PCR , Stool     Status: None   Collection Time: 07/03/20  7:01 PM   Specimen: STOOL  Result Value Ref Range Status   Campylobacter species NOT DETECTED NOT DETECTED Final   Plesimonas shigelloides NOT DETECTED NOT DETECTED Final   Salmonella species NOT DETECTED NOT DETECTED Final   Yersinia enterocolitica NOT DETECTED NOT DETECTED Final   Vibrio species NOT DETECTED NOT DETECTED Final   Vibrio cholerae NOT DETECTED NOT DETECTED Final   Enteroaggregative E coli (EAEC) NOT DETECTED NOT DETECTED Final   Enteropathogenic E coli (EPEC) NOT DETECTED NOT DETECTED Final   Enterotoxigenic E coli (ETEC) NOT DETECTED NOT DETECTED Final   Shiga like toxin producing E coli (STEC) NOT DETECTED NOT DETECTED Final   Shigella/Enteroinvasive E coli (EIEC) NOT DETECTED NOT DETECTED Final   Cryptosporidium  NOT DETECTED NOT DETECTED Final   Cyclospora cayetanensis NOT DETECTED NOT DETECTED Final   Entamoeba histolytica NOT DETECTED NOT DETECTED Final   Giardia lamblia NOT DETECTED NOT DETECTED Final   Adenovirus F40/41 NOT DETECTED NOT DETECTED Final   Astrovirus NOT DETECTED NOT DETECTED Final   Norovirus GI/GII NOT DETECTED NOT DETECTED Final   Rotavirus A NOT DETECTED NOT DETECTED Final   Sapovirus (I, II, IV, and V) NOT DETECTED NOT DETECTED Final    Comment: Performed at [facility], [address] [place], [place] [address]  C. Diff by PCR, Reflexed     Status: None   Collection Time: 07/03/20  7:01 PM  Result Value Ref Range Status   Toxigenic C. Difficile by PCR NEGATIVE NEGATIVE Final    Comment: Patient is colonized with non toxigenic C. difficile. May not need treatment unless significant symptoms are present. Performed at [facility], [place] [address], [place], [place] [address]   Culture, blood (routine x 2)     Status: None (Preliminary result)   Collection Time: 07/04/20  9:43 AM   Specimen: BLOOD LEFT HAND  Result Value  Ref Range Status   Specimen Description   Final    BLOOD LEFT HAND Performed at [place] [address] [place], [place] [address]    Special Requests   Final    BOTTLES DRAWN AEROBIC ONLY Blood Culture adequate volume Performed at [place] [address] [place], [place] [address]    Culture   Final    NO GROWTH 3 DAYS Performed at [facility], [place] [address] [place], [place] [address]    Report Status PENDING  Incomplete  Culture, blood (routine x 2)     Status: None (Preliminary result)   Collection Time: 07/04/20  9:43 AM   Specimen: BLOOD LEFT HAND  Result Value Ref Range Status   Specimen Description   Final    BLOOD LEFT HAND Performed at [place] [address], [place], [place] [address]    Special Requests   Final    BOTTLES DRAWN AEROBIC ONLY Blood Culture adequate volume Performed at [place] [address] [place], [place] [address]    Culture   Final    NO GROWTH 3 DAYS Performed at [facility], [place] [address] [place], [place] [address]    Report Status PENDING  Incomplete     RN Pressure Injury Documentation:     Estimated body mass index is 32.3 kg/m as calculated from the following:   Height as of this encounter: $RemoveBeforeD'6\' 2"'ySbrpOAqGUakBX$  (1.88 m).   Weight as of this encounter: 114.1 kg.  Malnutrition Type:  Nutrition Problem: Increased nutrient needs Etiology: cancer and cancer related treatments   Malnutrition Characteristics:  Signs/Symptoms: estimated needs  Nutrition Interventions:  Interventions: Prostat,Refer to RD note for recommendations   Radiology Studies: No results found. Scheduled Meds: . (feeding supplement) PROSource Plus  30 mL Oral BID BM  . sodium chloride   Intravenous Once  . chlorhexidine  15 mL Mouth/Throat QID  . Chlorhexidine Gluconate Cloth  6 each Topical Daily  . DULoxetine  60 mg Oral Daily  . furosemide  40 mg  Intravenous Once  . Gerhardt's butt cream   Topical QID  . protein supplement  1 Scoop Oral TID WC  . sodium bicarbonate  1,300 mg Oral BID  . sodium chloride flush  10-40 mL Intracatheter  Q12H  . Tbo-Filgrastim  480 mcg Subcutaneous q1800  . vancomycin  125 mg Oral QID   Continuous Infusions: . ceFEPime (MAXIPIME) IV 2 g (07/06/20 1644)  . magnesium sulfate bolus IVPB    . potassium chloride 10 mEq (07/07/20 0938)  .  sodium bicarbonate (isotonic) infusion in sterile water 125 mL/hr at 07/07/20 0527    LOS: 4 days   Kerney Elbe, DO [facility] PAGER is on [place]  If 7PM-7AM, please contact night-coverage [email or web address]

## 2020-07-07 NOTE — Progress Notes (Addendum)
[place] [facility] for Infectious Disease  Date of Admission:  07/02/2020     CC: sepsis/pseudomonal bacteremia  Lines: Chronic port (not accessed this admission) Peripheral iv's  Abx: 3/27-c po vanc 3/24-c cefepime 2 gram iv daily renally adjusted  3/25-26 cipro  Other: g-csf Ivette Loyal, last dose 3/21  ASSESSMENT: Sepsis/Pseudomonas bacteremia Left axillary induration/abscess Neutropenic fever Non-toxogenic cdiff colonization Presence of chronic Port S/p bilateral knee/left hip arthroplasty  Bladder cancer with intraabd lymph node metastasis Chemotherapy associated pancytopenia Afib, on chronic DOAC (being hold for thrombocytopenia) CKD5  70 yo male with bilateral knee and left hip arthroplasty, recurrent metastatic bladder cancer, chronic port, on chemo with Ivette Loyal (last cycle 06/23/2020 -- day 8 on 3/21) admitted 3/23 for generalized weakness found to have sepsis/pseudomonal bacteremia complicated by severe neutropenia  Patient is afebrile here. Pancytopenia 3/23 admission bcx pseudomonas aeruginosa (pan-sensitive) 3/25 repeat bcx negative  definining complication of CLABSI rather trouble some in lots of situation. Practicality in this case is to assume clabsi, and therefore removal of the port in setting pseudomonal bacteremia  Bilateral knee/left hip arhtroplasty assymptomatic  Surgery planning removal once platelet at acceptable level. Patient is clinically doing well on appropriate abx   ---------- 3/28 assessment -still very lethargic/neutropenic -Copious diarrhea in setting of Trodelvy. Team started empirically on PO vanc today for nontoxigenic cdiff. Had discussed this with dr Baxter Flattery previously -- will defer to that. Gi pcr negative otherwise -left axillary induration/cellulitic change -- will get u/s soft tissue r/o abscess. Cr too high to have ct with contrast -I don't think the bilateral LE rash is of drug associated changes. Monitor, not sure  if clinically significant or what it is at this time; I suppose trodelvy changes is a possibility but no itch or tenderness either  PLAN:  1. Await removal of port. Would give a 48 hours line holiday at least before any line replacement 2. Soft tissue chest u/s ordered to r/o abscess left axillary area 3. Can finish 10 days po vancomycin on 07/16/2020 4. Continue cefepime for now. Duration of antibiotics is 10 days, starting at first negative bcx or source control whichever is later. Can continue cefepime for at least 3 days of continued clinical stability/lack of sepsis and transition to PO to finish course  I spent more than 30 minute reviewing data/chart, and coordinating care and >50% direct face to face time providing counseling/discussing diagnostics/treatment plan with patient  Active Problems:   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 (HCC)   Thrombocytopenia (HCC)   Essential hypertension   Chronic atrial fibrillation (HCC)   Neutropenia (HCC)   CKD (chronic kidney disease) stage 5, GFR less than 15 ml/min (HCC)   Weakness generalized   Anemia of chronic disease   Scheduled Meds: . (feeding supplement) PROSource Plus  30 mL Oral BID BM  . sodium chloride   Intravenous Once  . chlorhexidine  15 mL Mouth/Throat QID  . Chlorhexidine Gluconate Cloth  6 each Topical Daily  . DULoxetine  60 mg Oral Daily  . furosemide  40 mg Intravenous Once  . Gerhardt's butt cream   Topical QID  . protein supplement  1 Scoop Oral TID WC  . sodium bicarbonate  1,300 mg Oral BID  . sodium chloride flush  10-40 mL Intracatheter Q12H  . Tbo-Filgrastim  480 mcg Subcutaneous q1800  . vancomycin  125 mg Oral QID   Continuous Infusions: . ceFEPime (MAXIPIME) IV 2 g (07/06/20 1644)  .  magnesium sulfate bolus IVPB    . potassium chloride 10 mEq (07/07/20 0955)  .  sodium bicarbonate (isotonic) infusion in sterile water 125 mL/hr at 07/07/20 0527   PRN Meds:.acetaminophen **OR**  acetaminophen, ondansetron **OR** ondansetron (ZOFRAN) IV, sodium chloride flush   SUBJECTIVE: Very lethargic still Poor appetite No fever Hemodynamics stable Diarrhea remains copious, 9 times last 24 hours, started empirically on PO vanc for toxogenic-negative cdiff Remains very thrombocytopenia/neutropenic; cr stable/improving in the low 6's; lft within normal limit  No knee/left hip pain  Review of Systems: ROS Other ros negative  No Known Allergies  OBJECTIVE: Vitals:   07/07/20 0116 07/07/20 0417 07/07/20 0607 07/07/20 0855  BP: 117/75 (!) 155/70 (!) 120/108 135/75  Pulse: (!) 105 (!) 109 (!) 105 (!) 106  Resp: 16 (!) $Remo'22 16 16  'hTIPp$ Temp: 98.9 F (37.2 C) 97.7 F (36.5 C) 98 F (36.7 C) 97.6 F (36.4 C)  TempSrc:  Oral  Oral  SpO2: 100% 100% 100% 99%  Weight:      Height:       Body mass index is 32.3 kg/m.  Physical Exam Acutely ill appearing; lethargic; conversant Heent: normocephalic; per; eomi; conj clear Neck supple cv rrr no mrg Lungs clear; normal respiratory effort abd s/nt Ext no edema Skin, left lateral hip site echymosis; slight nontender nonpruritic bilateral LE maculopapular rash; left axillary/chest is a 3 inch tender induration area with superficial erythema and 2 spot of skin maceration Neuro: nonfocal; diffusely weak msk no swelling/tenderness bilateral knee/left hip joint; intact active/passive rom  Lab Results Lab Results  Component Value Date   WBC <0.1 (LL) 07/07/2020   HGB 7.3 (L) 07/07/2020   HCT 21.6 (L) 07/07/2020   MCV 94.7 07/07/2020   PLT 13 (LL) 07/07/2020    Lab Results  Component Value Date   CREATININE 6.20 (H) 07/07/2020   BUN 91 (H) 07/07/2020   NA 132 (L) 07/07/2020   K 2.8 (L) 07/07/2020   CL 102 07/07/2020   CO2 17 (L) 07/07/2020    Lab Results  Component Value Date   ALT 26 07/07/2020   AST 15 07/07/2020   ALKPHOS 28 (L) 07/07/2020   BILITOT 1.0 07/07/2020     Microbiology: Recent Results (from the past  240 hour(s))  Blood culture (routine x 2)     Status: None (Preliminary result)   Collection Time: 07/03/20  1:28 AM   Specimen: BLOOD  Result Value Ref Range Status   Specimen Description   Final    BLOOD RIGHT ANTECUBITAL Performed at [facility], [place] [address], [place], [place] [address]    Special Requests   Final    BOTTLES DRAWN AEROBIC AND ANAEROBIC Blood Culture results may not be optimal due to an excessive volume of blood received in culture bottles Performed at [place] [address], [place], [place] [address]    Culture   Final    NO GROWTH 4 DAYS Performed at [facility], [place] [address] [place], [place] [address]    Report Status PENDING  Incomplete  Blood culture (routine x 2)     Status: Abnormal   Collection Time: 07/03/20  1:33 AM   Specimen: BLOOD  Result Value Ref Range Status   Specimen Description   Final    BLOOD RIGHT HAND Performed at [place] [address], [place],  [address]    Special Requests   Final    BOTTLES DRAWN AEROBIC  AND ANAEROBIC Blood Culture results may not be optimal due to an excessive volume of blood received in culture bottles Performed at [facility], [address], [place], [place] [address]    Culture  Setup Time   Final    AEROBIC BOTTLE ONLY GRAM NEGATIVE RODS CRITICAL RESULT CALLED TO, READ BACK BY AND VERIFIED WITHErling Cruz [facility] 07/04/20 0252 JDW Performed at [facility], [address] [place], [place] [address]    Culture PSEUDOMONAS AERUGINOSA (A)  Final   Report Status 07/05/2020 FINAL  Final   Organism ID, Bacteria PSEUDOMONAS AERUGINOSA  Final      Susceptibility   Pseudomonas aeruginosa - MIC*    CEFTAZIDIME 4 SENSITIVE Sensitive     CIPROFLOXACIN <=0.25 SENSITIVE Sensitive     GENTAMICIN <=1 SENSITIVE Sensitive     IMIPENEM 2 SENSITIVE Sensitive     PIP/TAZO 8 SENSITIVE Sensitive     CEFEPIME 2  SENSITIVE Sensitive     * PSEUDOMONAS AERUGINOSA  Resp Panel by RT-PCR (Flu A&B, Covid) Nasopharyngeal Swab     Status: None   Collection Time: 07/03/20  1:33 AM   Specimen: Nasopharyngeal Swab; Nasopharyngeal(NP) swabs in vial transport medium  Result Value Ref Range Status   SARS Coronavirus 2 by RT PCR NEGATIVE NEGATIVE Final    Comment: (NOTE) SARS-CoV-2 target nucleic acids are NOT DETECTED.  The SARS-CoV-2 RNA is generally detectable in upper respiratory specimens during the acute phase of infection. The lowest concentration of SARS-CoV-2 viral copies this assay can detect is 138 copies/mL. A negative result does not preclude SARS-Cov-2 infection and should not be used as the sole basis for treatment or other patient management decisions. A negative result may occur with  improper specimen collection/handling, submission of specimen other than nasopharyngeal swab, presence of viral mutation(s) within the areas targeted by this assay, and inadequate number of viral copies(<138 copies/mL). A negative result must be combined with clinical observations, patient history, and epidemiological information. The expected result is Negative.  Fact Sheet for Patients:  BloggerCourse.com  Fact Sheet for Healthcare Providers:  SeriousBroker.it  This test is no t yet approved or cleared by the [place] FDA and  has been authorized for detection and/or diagnosis of SARS-CoV-2 by FDA under an Emergency Use Authorization (EUA). This EUA will remain  in effect (meaning this test can be used) for the duration of the COVID-19 declaration under Section 564(b)(1) of the Act, 21 U.S.C.section 360bbb-3(b)(1), unless the authorization is terminated  or revoked sooner.       Influenza A by PCR NEGATIVE NEGATIVE Final   Influenza B by PCR NEGATIVE NEGATIVE Final    Comment: (NOTE) The Xpert Xpress SARS-CoV-2/FLU/RSV plus assay is intended as  an aid in the diagnosis of influenza from Nasopharyngeal swab specimens and should not be used as a sole basis for treatment. Nasal washings and aspirates are unacceptable for Xpert Xpress SARS-CoV-2/FLU/RSV testing.  Fact Sheet for Patients: BloggerCourse.com  Fact Sheet for Healthcare Providers: SeriousBroker.it  This test is not yet approved or cleared by the [place] FDA and has been authorized for detection and/or diagnosis of SARS-CoV-2 by FDA under an Emergency Use Authorization (EUA). This EUA will remain in effect (meaning this test can be used) for the duration of the COVID-19 declaration under Section 564(b)(1) of the Act, 21 U.S.C. section 360bbb-3(b)(1), unless the authorization is terminated or revoked.  Performed at [facility], [address], [place], [place] [address]  Blood Culture ID Panel (Reflexed)     Status: Abnormal   Collection Time: 07/03/20  1:33 AM  Result Value Ref Range Status   Enterococcus faecalis NOT DETECTED NOT DETECTED Final   Enterococcus Faecium NOT DETECTED NOT DETECTED Final   Listeria monocytogenes NOT DETECTED NOT DETECTED Final   Staphylococcus species NOT DETECTED NOT DETECTED Final   Staphylococcus aureus (BCID) NOT DETECTED NOT DETECTED Final   Staphylococcus epidermidis NOT DETECTED NOT DETECTED Final   Staphylococcus lugdunensis NOT DETECTED NOT DETECTED Final   Streptococcus species NOT DETECTED NOT DETECTED Final   Streptococcus agalactiae NOT DETECTED NOT DETECTED Final   Streptococcus pneumoniae NOT DETECTED NOT DETECTED Final   Streptococcus pyogenes NOT DETECTED NOT DETECTED Final   A.calcoaceticus-baumannii NOT DETECTED NOT DETECTED Final   Bacteroides fragilis NOT DETECTED NOT DETECTED Final   Enterobacterales NOT DETECTED NOT DETECTED Final   Enterobacter cloacae complex NOT DETECTED NOT DETECTED Final   Escherichia coli NOT DETECTED NOT  DETECTED Final   Klebsiella aerogenes NOT DETECTED NOT DETECTED Final   Klebsiella oxytoca NOT DETECTED NOT DETECTED Final   Klebsiella pneumoniae NOT DETECTED NOT DETECTED Final   Proteus species NOT DETECTED NOT DETECTED Final   Salmonella species NOT DETECTED NOT DETECTED Final   Serratia marcescens NOT DETECTED NOT DETECTED Final   Haemophilus influenzae NOT DETECTED NOT DETECTED Final   Neisseria meningitidis NOT DETECTED NOT DETECTED Final   Pseudomonas aeruginosa DETECTED (A) NOT DETECTED Final    Comment: CRITICAL RESULT CALLED TO, READ BACK BY AND VERIFIED WITH: E JACKSON PHARMD 07/04/20 0252 JDW    Stenotrophomonas maltophilia NOT DETECTED NOT DETECTED Final   Candida albicans NOT DETECTED NOT DETECTED Final   Candida auris NOT DETECTED NOT DETECTED Final   Candida glabrata NOT DETECTED NOT DETECTED Final   Candida krusei NOT DETECTED NOT DETECTED Final   Candida parapsilosis NOT DETECTED NOT DETECTED Final   Candida tropicalis NOT DETECTED NOT DETECTED Final   Cryptococcus neoformans/gattii NOT DETECTED NOT DETECTED Final   CTX-M ESBL NOT DETECTED NOT DETECTED Final   Carbapenem resistance IMP NOT DETECTED NOT DETECTED Final   Carbapenem resistance KPC NOT DETECTED NOT DETECTED Final   Carbapenem resistance NDM NOT DETECTED NOT DETECTED Final   Carbapenem resistance VIM NOT DETECTED NOT DETECTED Final    Comment: Performed at [facility], [address], [place], [place] [address]  C Difficile Quick Screen w PCR reflex     Status: Abnormal   Collection Time: 07/03/20  7:01 PM   Specimen: STOOL  Result Value Ref Range Status   C Diff antigen POSITIVE (A) NEGATIVE Final   C Diff toxin NEGATIVE NEGATIVE Final   C Diff interpretation Results are indeterminate. See PCR results.  Final    Comment: Performed at [facility], [place] [address] [place], [place] [address]  Gastrointestinal Panel by PCR , Stool     Status: None   Collection Time: 07/03/20   7:01 PM   Specimen: STOOL  Result Value Ref Range Status   Campylobacter species NOT DETECTED NOT DETECTED Final   Plesimonas shigelloides NOT DETECTED NOT DETECTED Final   Salmonella species NOT DETECTED NOT DETECTED Final   Yersinia enterocolitica NOT DETECTED NOT DETECTED Final   Vibrio species NOT DETECTED NOT DETECTED Final   Vibrio cholerae NOT DETECTED NOT DETECTED Final   Enteroaggregative E coli (EAEC) NOT DETECTED NOT DETECTED Final   Enteropathogenic E coli (EPEC) NOT DETECTED NOT DETECTED Final   Enterotoxigenic  E coli (ETEC) NOT DETECTED NOT DETECTED Final   Shiga like toxin producing E coli (STEC) NOT DETECTED NOT DETECTED Final   Shigella/Enteroinvasive E coli (EIEC) NOT DETECTED NOT DETECTED Final   Cryptosporidium NOT DETECTED NOT DETECTED Final   Cyclospora cayetanensis NOT DETECTED NOT DETECTED Final   Entamoeba histolytica NOT DETECTED NOT DETECTED Final   Giardia lamblia NOT DETECTED NOT DETECTED Final   Adenovirus F40/41 NOT DETECTED NOT DETECTED Final   Astrovirus NOT DETECTED NOT DETECTED Final   Norovirus GI/GII NOT DETECTED NOT DETECTED Final   Rotavirus A NOT DETECTED NOT DETECTED Final   Sapovirus (I, II, IV, and V) NOT DETECTED NOT DETECTED Final    Comment: Performed at [facility], [address] [place], [place] [address]  C. Diff by PCR, Reflexed     Status: None   Collection Time: 07/03/20  7:01 PM  Result Value Ref Range Status   Toxigenic C. Difficile by PCR NEGATIVE NEGATIVE Final    Comment: Patient is colonized with non toxigenic C. difficile. May not need treatment unless significant symptoms are present. Performed at [facility], [place] [address], [place], [place] [address]   Culture, blood (routine x 2)     Status: None (Preliminary result)   Collection Time: 07/04/20  9:43 AM   Specimen: BLOOD LEFT HAND  Result Value Ref Range Status   Specimen Description   Final    BLOOD LEFT HAND Performed at [place] [address], [place], [place] [address]    Special Requests   Final    BOTTLES DRAWN AEROBIC ONLY Blood Culture adequate volume Performed at [place] [address], [place], [place] [address]    Culture   Final    NO GROWTH 3 DAYS Performed at [facility], [place] [address], [place], [place] [address]    Report Status PENDING  Incomplete  Culture, blood (routine x 2)     Status: None (Preliminary result)   Collection Time: 07/04/20  9:43 AM   Specimen: BLOOD LEFT HAND  Result Value Ref Range Status   Specimen Description   Final    BLOOD LEFT HAND Performed at [place] [address], [place], [place] [address]    Special Requests   Final    BOTTLES DRAWN AEROBIC ONLY Blood Culture adequate volume Performed at [place] [address], [place], [place] [address]    Culture   Final    NO GROWTH 3 DAYS Performed at [facility], [place] [address], [place],  [address]    Report Status PENDING  Incomplete    Serology:  Imaging: If present, new imagings (plain films, ct scans, and mri) have been personally visualized and interpreted; radiology reports have been reviewed. Decision making incorporated into the Impression / Recommendations.  3/24 cxr The heart size and mediastinal contours are mildly enlarged. A right-sided MediPort catheter seen with the tip at the superior cavoatrial junction. Both lungs are clear. The visualized skeletal structures are unremarkable.  3/25 abd xray The bowel gas pattern is normal. There is no evidence of free air. No radio-opaque calculi or other significant radiographic abnormality is seen.  3/25 renal u/s 1. No obstructive uropathy. 2. Bilateral renal parenchymal thinning and increased echogenicity consistent with chronic medical renal disease. 3. Echogenic bladder wall thickening in the right bladder, corresponding to that seen on prior  PET CT, at site of hypermetabolism prior  PET.  Jabier Mutton, MD [place] for Infectious [place] [phone] pager    07/07/2020, 10:09 AM

## 2020-07-07 NOTE — Progress Notes (Signed)
Unfortunately, things were not much better.  His white cell count is still less than 0.1.  He is on Neupogen.  He did get platelets yesterday.  His platelet count today is 13,000.  His hemoglobin is 7.3.  I really think he is going need to be transfused.  I really do not think that the Port-A-Cath needs to come out.  I would think that with appropriate antibiotics, the Pseudomonas will be able to be treated.  He does not have spiking temperatures.  He still having the diarrhea.  I really do not think this is going to get better until his white cell count improves.  He is really not that hungry.  His mouth is quite dry.  I will try him on some Peridex mouth rinse to see if this may help a little bit.  His temperature is 98.  Pulse 105.  Blood pressure 120/108.  His lungs sound clear bilaterally.  Cardiac exam irregular rate and rhythm consistent with atrial fibrillation.  The rate is well controlled.  Abdominal exam is soft.  Bowel sounds are present.  He has no guarding or rebound tenderness.  We will continue to treat with Maxipime.  ID is following.  Again, I really do not think we have to pull out the Port-A-Cath for this bacteremia.  It is not like he has had recurrent bacteremia with the same bacteria.  I would be much more worried if we are dealing with a Staph bacteremia.  Hopefully, his white cell count will start coming up.  I appreciate all the help that he is getting from the staff up on 6 E.  [name], MD  [place] 9:9

## 2020-07-07 NOTE — Care Management Important Message (Signed)
Important Message  Patient Details IM Letter given to the Patient. Name: [name] Rose MRN: [id number] Date of Birth: 1950-11-27   Medicare Important Message Given:  Yes     Kerin Salen 07/07/2020, 9:14 AM

## 2020-07-07 NOTE — Progress Notes (Signed)
Pharmacy Antibiotic Note  [name] Rose is a 70 y.o. male with a h/o bladder cancer who received chemo 3/21 admitted on 07/02/2020 with weakness. Patient is pancytopenic. Now found to have pseudomonas in 1/4 blood cultures. Patient placed on double coverage with Cefepime and Ciprofloxacin per Oncology. ID has narrowed to Cefepime alone. Patient also placed on PO vanc 3/27 for C.difficile diarrhea.   Plan: Continue Cefepime 2g IV q24h PO Vancomycin per MD Monitor renal function, cultures, clinical course  Height: $Remove'6\' 2"'lwiByyy$  (188 cm) Weight: 114.1 kg (251 lb 8.7 oz) IBW/kg (Calculated) : 82.2  Temp (24hrs), Avg:98 F (36.7 C), Min:97.4 F (36.3 C), Max:99 F (37.2 C)  Recent Labs  Lab 07/03/20 0009 07/03/20 0132 07/03/20 0757 07/04/20 0537 07/05/20 1054 07/06/20 0528 07/07/20 0506  WBC 0.1*  --  <0.1* <0.1* <0.1* <0.1* <0.1*  CREATININE 6.35*  --   --  6.88* 6.77*  5.91* 6.71* 6.20*  LATICACIDVEN  --  1.8  --   --   --   --   --     Estimated Creatinine Clearance: 15.3 mL/min (A) (by C-G formula based on SCr of 6.2 mg/dL (H)).    No Known Allergies  Antimicrobials this admission: 3/24 Cefepime >>  3/25 Ciprofloxacin > 3/26 3/27 PO Vancomycin >>   Microbiology results: 3/24 BCx: 1/4 Pseudomonas aeruginosa (pan-sensitive) 3/24 Resp panel: COVID negative, Influenza A/B negative  3/24 GI panel: negative 3/24 C diff quick screen: antigen (+) toxin (-), neg by PCR 3/25 BCx: NGTD  Thank you for allowing pharmacy to be a part of this patient's care.   Lindell Spar, PharmD, BCPS Clinical Pharmacist 07/07/2020 8:18 AM

## 2020-07-07 NOTE — Progress Notes (Signed)
   Subjective/Chief Complaint: Feels terrible   Objective: Vital signs in last 24 hours: Temp:  [97.4 F (36.3 C)-99 F (37.2 C)] 97.6 F (36.4 C) (03/28 0855) Pulse Rate:  [80-110] 106 (03/28 0855) Resp:  [16-22] 16 (03/28 0855) BP: (100-155)/(66-108) 135/75 (03/28 0855) SpO2:  [99 %-100 %] 99 % (03/28 0855) Last BM Date: 07/07/20  Intake/Output from previous day: 03/27 0701 - 03/28 0700 In: 2989.4 [P.O.:240; I.V.:2070.4; Blood:579.1; IV Piggyback:100] Out: 2100 [Urine:2100] Intake/Output this shift: Total I/O In: -  Out: 802 [Urine:800; Stool:2]  General appearance: Alert, ill-appearing Neck: no JVD and supple, symmetrical, trachea midline Resp: Unlabored Chest wall: no tenderness, Right-sided port site without any induration, erythema, tenderness or fluctuance. GI: Nontender Skin: Warm and dry, few scattered petechia Neurologic: Grossly normal  Lab Results:  Recent Labs    07/06/20 0528 07/07/20 0506  WBC <0.1* <0.1*  HGB 7.6* 7.3*  HCT 22.7* 21.6*  PLT 5* 13*   BMET Recent Labs    07/06/20 0528 07/07/20 0506  NA 130* 132*  K 3.9 2.8*  CL 106 102  CO2 15* 17*  GLUCOSE 135* 127*  BUN 92* 91*  CREATININE 6.71* 6.20*  CALCIUM 8.1* 7.6*   PT/INR No results for input(s): LABPROT, INR in the last 72 hours. ABG No results for input(s): PHART, HCO3 in the last 72 hours.  Invalid input(s): PCO2, PO2  Studies/Results: No results found.  Anti-infectives: Anti-infectives (From admission, onward)   Start     Dose/Rate Route Frequency Ordered Stop   07/06/20 1000  vancomycin (VANCOCIN) 50 mg/mL oral solution 125 mg        125 mg Oral 4 times daily 07/06/20 0858 07/16/20 0959   07/04/20 1000  ciprofloxacin (CIPRO) IVPB 400 mg  Status:  Discontinued        400 mg 200 mL/hr over 60 Minutes Intravenous Every 24 hours 07/04/20 0848 07/05/20 1550   07/03/20 1300  ceFEPIme (MAXIPIME) 2 g in sodium chloride 0.9 % 100 mL IVPB        2 g 200 mL/hr over 30  Minutes Intravenous Every 24 hours 07/03/20 1153        Assessment/Plan: Severe leukopenia (<0.1) and thrombocytopenia (13 after platelet transfusion), Pseudomonas bacteremia (has had various different bacteremias over the last few years, none repeating), C. Difficile  Would not recommend removing the Port-A-Cath.   We will sign off at this time.     LOS: 4 days    [name] Rose 07/07/2020

## 2020-07-08 ENCOUNTER — Ambulatory Visit: Payer: PPO | Admitting: Hematology & Oncology

## 2020-07-08 ENCOUNTER — Ambulatory Visit: Payer: PPO

## 2020-07-08 ENCOUNTER — Other Ambulatory Visit: Payer: PPO

## 2020-07-08 DIAGNOSIS — D709 Neutropenia, unspecified: Secondary | ICD-10-CM

## 2020-07-08 DIAGNOSIS — C679 Malignant neoplasm of bladder, unspecified: Secondary | ICD-10-CM | POA: Diagnosis not present

## 2020-07-08 DIAGNOSIS — R7881 Bacteremia: Secondary | ICD-10-CM | POA: Diagnosis not present

## 2020-07-08 DIAGNOSIS — R197 Diarrhea, unspecified: Secondary | ICD-10-CM

## 2020-07-08 DIAGNOSIS — D701 Agranulocytosis secondary to cancer chemotherapy: Secondary | ICD-10-CM | POA: Diagnosis not present

## 2020-07-08 DIAGNOSIS — I482 Chronic atrial fibrillation, unspecified: Secondary | ICD-10-CM | POA: Diagnosis not present

## 2020-07-08 DIAGNOSIS — R5081 Fever presenting with conditions classified elsewhere: Secondary | ICD-10-CM

## 2020-07-08 DIAGNOSIS — D61810 Antineoplastic chemotherapy induced pancytopenia: Secondary | ICD-10-CM

## 2020-07-08 DIAGNOSIS — B965 Pseudomonas (aeruginosa) (mallei) (pseudomallei) as the cause of diseases classified elsewhere: Secondary | ICD-10-CM | POA: Diagnosis not present

## 2020-07-08 DIAGNOSIS — T451X5A Adverse effect of antineoplastic and immunosuppressive drugs, initial encounter: Secondary | ICD-10-CM | POA: Diagnosis not present

## 2020-07-08 DIAGNOSIS — A4152 Sepsis due to Pseudomonas: Principal | ICD-10-CM

## 2020-07-08 DIAGNOSIS — D638 Anemia in other chronic diseases classified elsewhere: Secondary | ICD-10-CM | POA: Diagnosis not present

## 2020-07-08 LAB — BPAM RBC
Blood Product Expiration Date: 202204182359
Blood Product Expiration Date: 202204192359
ISSUE DATE / TIME: 202203281534
ISSUE DATE / TIME: 202203282220
Unit Type and Rh: 6200
Unit Type and Rh: 6200

## 2020-07-08 LAB — CULTURE, BLOOD (ROUTINE X 2): Culture: NO GROWTH

## 2020-07-08 LAB — COMPREHENSIVE METABOLIC PANEL
ALT: 22 U/L (ref 0–44)
AST: 12 U/L — ABNORMAL LOW (ref 15–41)
Albumin: 2.1 g/dL — ABNORMAL LOW (ref 3.5–5.0)
Alkaline Phosphatase: 27 U/L — ABNORMAL LOW (ref 38–126)
Anion gap: 13 (ref 5–15)
BUN: 97 mg/dL — ABNORMAL HIGH (ref 8–23)
CO2: 16 mmol/L — ABNORMAL LOW (ref 22–32)
Calcium: 7.7 mg/dL — ABNORMAL LOW (ref 8.9–10.3)
Chloride: 103 mmol/L (ref 98–111)
Creatinine, Ser: 6.07 mg/dL — ABNORMAL HIGH (ref 0.61–1.24)
GFR, Estimated: 9 mL/min — ABNORMAL LOW (ref >60)
Glucose, Bld: 156 mg/dL — ABNORMAL HIGH (ref 70–99)
Potassium: 3.1 mmol/L — ABNORMAL LOW (ref 3.5–5.1)
Sodium: 132 mmol/L — ABNORMAL LOW (ref 135–145)
Total Bilirubin: 1.6 mg/dL — ABNORMAL HIGH (ref 0.3–1.2)
Total Protein: 4.8 g/dL — ABNORMAL LOW (ref 6.5–8.1)

## 2020-07-08 LAB — TYPE AND SCREEN
ABO/RH(D): A POS
Antibody Screen: NEGATIVE
Unit division: 0
Unit division: 0

## 2020-07-08 LAB — CBC WITH DIFFERENTIAL/PLATELET
HCT: 27.4 % — ABNORMAL LOW (ref 39.0–52.0)
Hemoglobin: 9.3 g/dL — ABNORMAL LOW (ref 13.0–17.0)
MCH: 31.6 pg (ref 26.0–34.0)
MCHC: 33.9 g/dL (ref 30.0–36.0)
MCV: 93.2 fL (ref 80.0–100.0)
Platelets: 6 10*3/uL — CL (ref 150–400)
RBC: 2.94 MIL/uL — ABNORMAL LOW (ref 4.22–5.81)
RDW: 15.9 % — ABNORMAL HIGH (ref 11.5–15.5)
WBC: 0.1 10*3/uL — CL (ref 4.0–10.5)
nRBC: 0 % (ref 0.0–0.2)

## 2020-07-08 LAB — PHOSPHORUS: Phosphorus: 4.6 mg/dL (ref 2.5–4.6)

## 2020-07-08 LAB — MAGNESIUM: Magnesium: 1.9 mg/dL (ref 1.7–2.4)

## 2020-07-08 MED ORDER — POTASSIUM CHLORIDE 10 MEQ/100ML IV SOLN
10.0000 meq | INTRAVENOUS | Status: AC
  Administered 2020-07-08 (×3): 10 meq via INTRAVENOUS
  Filled 2020-07-08 (×2): qty 100

## 2020-07-08 MED ORDER — SODIUM CHLORIDE 0.9% IV SOLUTION: Freq: Once | INTRAVENOUS | Status: AC

## 2020-07-08 MED ORDER — ACETAMINOPHEN 325 MG PO TABS
650.0000 mg | ORAL_TABLET | Freq: Once | ORAL | Status: AC
  Administered 2020-07-08: 650 mg via ORAL
  Filled 2020-07-08: qty 2

## 2020-07-08 MED ORDER — SODIUM BICARBONATE 650 MG PO TABS
1300.0000 mg | ORAL_TABLET | Freq: Two times a day (BID) | ORAL | Status: AC
  Administered 2020-07-08 – 2020-07-09 (×3): 1300 mg via ORAL
  Filled 2020-07-08 (×3): qty 2

## 2020-07-08 NOTE — Progress Notes (Signed)
  Overall, he just is about the same.  His white cell count is 0.1 now.  Hopefully this is a sign that we are seeing some improvement.  He did get 2 units of blood yesterday.  His hemoglobin is 9.3.  His platelet count is down to 6000.  We had to transfuse him again.  He is on Maxipime for his Pseudomonas bacteremia.  He has not had any temperature spikes.  I still do not believe that the Port-A-Cath needs to be removed.  I know that ID is following.  I am not sure what CLABSI means.  He is making urine.  His renal failure is about the same.  BUN is 97 creatinine is 6.1.  His calcium is quite low at 7.7.  His albumin is low at 2.1.  I still think he is eating much.  He really does not have much of an appetite.  He still having his diarrhea.  Again, I think the diarrhea will continue as long as he is neutropenic.  He may have an element of typhlitis.  There is no obvious bleeding.  The longer he is profoundly neutropenic, the harder will be for him to make the recovery.  His potassium is on the low side right now.  With his renal failure, I would sure this needs to be replaced.  There has been no nausea or vomiting.  His vital signs show temperature 98.3.  Pulse 108.  Blood pressure 138/82.  His lungs sound clear bilaterally.  Oral exam is incredibly dry.  He really needs has some oral care to try to help with his dry mucosa.  I do not see any ulcerations.  Cardiac exam tachycardic and irregular.  Abdomen is soft.  Bowel sounds are slightly decreased.  There is no guarding or rebound tenderness.  Again, [name] Rose has a Pseudomonas bacteremia in the face of his profound neutropenia.  He has diarrhea.  He has a renal failure that thankfully is stable.  Still making urine.  There is a lot going on here.  Everything will really be dictated by his white cell count recovery.  Hopefully will start to see this happen.  I realize that this is incredibly complicated.  I know everybody is working very  hard to help [name] Rose.  I appreciate all the efforts being made to help him.   [name], MD  [facility] 6:9

## 2020-07-08 NOTE — Progress Notes (Signed)
[name] Rose for Infectious Disease  Date of Admission:  07/02/2020     CC: sepsis/pseudomonal bacteremia  Lines: Chronic port (not accessed this admission) Peripheral iv's  Abx: 3/27-c po vanc 3/24-c cefepime 2 gram iv daily renally adjusted  3/25-26 cipro  Other: g-csf Trodelvy, last dose 3/21  ASSESSMENT: Sepsis/Pseudomonas bacteremia Left axillary induration/abscess Neutropenic fever Non-toxogenic cdiff colonization; diarrhea Presence of chronic Port S/p bilateral knee/left hip arthroplasty -- assymptomatic during bacteremic episode Bladder cancer with intraabd lymph node metastasis Chemotherapy associated pancytopenia Afib, on chronic DOAC (being hold for thrombocytopenia) CKD5  70 yo male with bilateral knee and left hip arthroplasty, recurrent metastatic bladder cancer, chronic port, on chemo with Ivette Loyal (last cycle 06/23/2020 -- day 8 on 3/21) admitted 3/23 for generalized weakness found to have sepsis/pseudomonal bacteremia complicated by severe neutropenia    #neutropenia #pseudomonas bacteremia #present of port Patient is afebrile here. Pancytopenia 3/23 admission bcx pseudomonas aeruginosa (pan-sensitive) 3/25 repeat bcx negative  He has left axillary area erythema which appear to be ssi. U/s soft tissue no abscess  definining complication of CLABSI rather trouble some in lots of situation. At this time, repeat bcx negative, no fever, and hemodynamically stable. Unable to get his platelet up. Left chest erythema could be source of pseudomonas getting in the blood stream. Again we don't have concrete evidence that the pseudomonas source/complication is central line assciated blood stream infection. Given, at the moment, uncertainty of this and high risk of morbidity/mortality removing the port at this time, it is reasonable to continue antibiotics and observe him clinically  If hemodynamic disturbance and recurrent pseudomonas bacteremia, the  port then would need to be removed   #diarrhea negative gi pcr Positive cdiff antigen but negative toxin Due to copious diarrhea, primary team had started tx with po vanc 3/28 (they had previously discussed with dr Baxter Flattery who agreed with that plan) His chemo certainly have significantly high risk of causing diarrhea as well    PLAN:  1. Can finish 10 days po vancomycin on 07/16/2020 2. Continue cefepime for now. Will plan 14 days from last negative blood culture of 3/25 3. If hemodynamics disturbance, or fever, please repeat blood culture  I spent more than 25 minute reviewing data/chart, and coordinating care and >50% direct face to face time providing counseling/discussing diagnostics/treatment plan with patient  Active Problems:   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 (HCC)   Thrombocytopenia (HCC)   Bacteremia due to Pseudomonas   Essential hypertension   Chronic atrial fibrillation (HCC)   Neutropenia (HCC)   CKD (chronic kidney disease) stage 5, GFR less than 15 ml/min (HCC)   Weakness generalized   Anemia of chronic disease   Abscess   Scheduled Meds: . (feeding supplement) PROSource Plus  30 mL Oral BID BM  . chlorhexidine  15 mL Mouth/Throat QID  . Chlorhexidine Gluconate Cloth  6 each Topical Daily  . DULoxetine  60 mg Oral Daily  . Gerhardt's butt cream   Topical QID  . potassium chloride  40 mEq Oral Daily  . protein supplement  1 Scoop Oral TID WC  . sodium chloride flush  10-40 mL Intracatheter Q12H  . Tbo-Filgrastim  480 mcg Subcutaneous q1800  . vancomycin  125 mg Oral QID   Continuous Infusions: . ceFEPime (MAXIPIME) IV 2 g (07/07/20 1344)  . lactated ringers 125 mL/hr at 07/08/20 0219   PRN Meds:.acetaminophen **OR** acetaminophen, ondansetron **OR** ondansetron (ZOFRAN) IV, sodium chloride flush  SUBJECTIVE: Very lethargic still Poor appetite No fever Continues to be severely neutropenic  Left axillar area u/s soft  tissue no obvious abscess Hemodynamics stable Diarrhea remains copious -- vanc PO started 3/28 No knee/left hip pain  Review of Systems: ROS Other ros negative  No Known Allergies  OBJECTIVE: Vitals:   07/08/20 0500 07/08/20 0600 07/08/20 0909 07/08/20 0946  BP:  138/82 137/90 (!) 141/88  Pulse:  (!) 108 (!) 108 (!) 110  Resp:  (!) $Re'22 19 20  'FLy$ Temp:  98.3 F (36.8 C) 98.4 F (36.9 C) 98.3 F (36.8 C)  TempSrc:  Oral Oral   SpO2:   99%   Weight: 115.8 kg     Height:       Body mass index is 32.78 kg/m.  Physical Exam Acutely ill appearing; lethargic; conversant Heent: normocephalic; per; eomi; conj clear Neck supple cv rrr no mrg Lungs clear; normal respiratory effort abd s/nt Ext no edema Skin: left lateral hip site echymosis; stable slight nontender nonpruritic bilateral LE maculopapular rash; stable left axillary/chest is a 3 inch mildly tender induration area with superficial erythema and 2 spot of skin maceration Neuro: nonfocal; diffusely weak msk no swelling/tenderness bilateral knee/left hip joint; intact active/passive rom  Lab Results Lab Results  Component Value Date   WBC 0.1 (LL) 07/08/2020   HGB 9.3 (L) 07/08/2020   HCT 27.4 (L) 07/08/2020   MCV 93.2 07/08/2020   PLT 6 (LL) 07/08/2020    Lab Results  Component Value Date   CREATININE 6.07 (H) 07/08/2020   BUN 97 (H) 07/08/2020   NA 132 (L) 07/08/2020   K 3.1 (L) 07/08/2020   CL 103 07/08/2020   CO2 16 (L) 07/08/2020    Lab Results  Component Value Date   ALT 22 07/08/2020   AST 12 (L) 07/08/2020   ALKPHOS 27 (L) 07/08/2020   BILITOT 1.6 (H) 07/08/2020     Microbiology: Recent Results (from the past 240 hour(s))  Blood culture (routine x 2)     Status: None   Collection Time: 07/03/20  1:28 AM   Specimen: BLOOD  Result Value Ref Range Status   Specimen Description   Final    BLOOD RIGHT ANTECUBITAL Performed at [facility], [place] [address] [place], [place]  [address]    Special Requests   Final    BOTTLES DRAWN AEROBIC AND ANAEROBIC Blood Culture results may not be optimal due to an excessive volume of blood received in culture bottles Performed at [facility] [address], [place], [place] [address]    Culture   Final    NO GROWTH 5 DAYS Performed at [facility], [place] [address] [place], [place] [address]    Report Status 07/08/2020 FINAL  Final  Blood culture (routine x 2)     Status: Abnormal   Collection Time: 07/03/20  1:33 AM   Specimen: BLOOD  Result Value Ref Range Status   Specimen Description   Final    BLOOD RIGHT HAND Performed at [facility] [address] [place], [place] [address]    Special Requests   Final    BOTTLES DRAWN AEROBIC AND ANAEROBIC Blood Culture results may not be optimal due to an excessive volume of blood received in culture bottles Performed at [facility] [address], [place], [place] [address]    Culture  Setup Time   Final    AEROBIC BOTTLE ONLY GRAM NEGATIVE RODS  CRITICAL RESULT CALLED TO, READ BACK BY AND VERIFIED WITHSeleta Rhymes [facility] 07/04/20 0252 JDW Performed at [facility], [place] [address], [place], [place] [address]    Culture PSEUDOMONAS AERUGINOSA (A)  Final   Report Status 07/05/2020 FINAL  Final   Organism ID, Bacteria PSEUDOMONAS AERUGINOSA  Final      Susceptibility   Pseudomonas aeruginosa - MIC*    CEFTAZIDIME 4 SENSITIVE Sensitive     CIPROFLOXACIN <=0.25 SENSITIVE Sensitive     GENTAMICIN <=1 SENSITIVE Sensitive     IMIPENEM 2 SENSITIVE Sensitive     PIP/TAZO 8 SENSITIVE Sensitive     CEFEPIME 2 SENSITIVE Sensitive     * PSEUDOMONAS AERUGINOSA  Resp Panel by RT-PCR (Flu A&B, Covid) Nasopharyngeal Swab     Status: None   Collection Time: 07/03/20  1:33 AM   Specimen: Nasopharyngeal Swab; Nasopharyngeal(NP) swabs in vial transport medium  Result Value Ref Range Status   SARS Coronavirus 2 by RT PCR  NEGATIVE NEGATIVE Final    Comment: (NOTE) SARS-CoV-2 target nucleic acids are NOT DETECTED.  The SARS-CoV-2 RNA is generally detectable in upper respiratory specimens during the acute phase of infection. The lowest concentration of SARS-CoV-2 viral copies this assay can detect is 138 copies/mL. A negative result does not preclude SARS-Cov-2 infection and should not be used as the sole basis for treatment or other patient management decisions. A negative result may occur with  improper specimen collection/handling, submission of specimen other than nasopharyngeal swab, presence of viral mutation(s) within the areas targeted by this assay, and inadequate number of viral copies(<138 copies/mL). A negative result must be combined with clinical observations, patient history, and epidemiological information. The expected result is Negative.  Fact Sheet for Patients:  EntrepreneurPulse.com.au  Fact Sheet for Healthcare Providers:  IncredibleEmployment.be  This test is no t yet approved or cleared by the [place] FDA and  has been authorized for detection and/or diagnosis of SARS-CoV-2 by FDA under an Emergency Use Authorization (EUA). This EUA will remain  in effect (meaning this test can be used) for the duration of the COVID-19 declaration under Section 564(b)(1) of the Act, 21 U.S.C.section 360bbb-3(b)(1), unless the authorization is terminated  or revoked sooner.       Influenza A by PCR NEGATIVE NEGATIVE Final   Influenza B by PCR NEGATIVE NEGATIVE Final    Comment: (NOTE) The Xpert Xpress SARS-CoV-2/FLU/RSV plus assay is intended as an aid in the diagnosis of influenza from Nasopharyngeal swab specimens and should not be used as a sole basis for treatment. Nasal washings and aspirates are unacceptable for Xpert Xpress SARS-CoV-2/FLU/RSV testing.  Fact Sheet for Patients: EntrepreneurPulse.com.au  Fact Sheet for  Healthcare Providers: IncredibleEmployment.be  This test is not yet approved or cleared by the [place] FDA and has been authorized for detection and/or diagnosis of SARS-CoV-2 by FDA under an Emergency Use Authorization (EUA). This EUA will remain in effect (meaning this test can be used) for the duration of the COVID-19 declaration under Section 564(b)(1) of the Act, 21 U.S.C. section 360bbb-3(b)(1), unless the authorization is terminated or revoked.  Performed at [facility], [place] [address], [place], [place] [address]   Blood Culture ID Panel (Reflexed)     Status: Abnormal   Collection Time: 07/03/20  1:33 AM  Result Value Ref Range Status   Enterococcus faecalis NOT DETECTED NOT DETECTED Final   Enterococcus Faecium NOT DETECTED NOT DETECTED Final   Listeria monocytogenes NOT DETECTED NOT DETECTED  Final   Staphylococcus species NOT DETECTED NOT DETECTED Final   Staphylococcus aureus (BCID) NOT DETECTED NOT DETECTED Final   Staphylococcus epidermidis NOT DETECTED NOT DETECTED Final   Staphylococcus lugdunensis NOT DETECTED NOT DETECTED Final   Streptococcus species NOT DETECTED NOT DETECTED Final   Streptococcus agalactiae NOT DETECTED NOT DETECTED Final   Streptococcus pneumoniae NOT DETECTED NOT DETECTED Final   Streptococcus pyogenes NOT DETECTED NOT DETECTED Final   A.calcoaceticus-baumannii NOT DETECTED NOT DETECTED Final   Bacteroides fragilis NOT DETECTED NOT DETECTED Final   Enterobacterales NOT DETECTED NOT DETECTED Final   Enterobacter cloacae complex NOT DETECTED NOT DETECTED Final   Escherichia coli NOT DETECTED NOT DETECTED Final   Klebsiella aerogenes NOT DETECTED NOT DETECTED Final   Klebsiella oxytoca NOT DETECTED NOT DETECTED Final   Klebsiella pneumoniae NOT DETECTED NOT DETECTED Final   Proteus species NOT DETECTED NOT DETECTED Final   Salmonella species NOT DETECTED NOT DETECTED Final   Serratia marcescens NOT  DETECTED NOT DETECTED Final   Haemophilus influenzae NOT DETECTED NOT DETECTED Final   Neisseria meningitidis NOT DETECTED NOT DETECTED Final   Pseudomonas aeruginosa DETECTED (A) NOT DETECTED Final    Comment: CRITICAL RESULT CALLED TO, READ BACK BY AND VERIFIED WITH: E JACKSON PHARMD 07/04/20 0252 JDW    Stenotrophomonas maltophilia NOT DETECTED NOT DETECTED Final   Candida albicans NOT DETECTED NOT DETECTED Final   Candida auris NOT DETECTED NOT DETECTED Final   Candida glabrata NOT DETECTED NOT DETECTED Final   Candida krusei NOT DETECTED NOT DETECTED Final   Candida parapsilosis NOT DETECTED NOT DETECTED Final   Candida tropicalis NOT DETECTED NOT DETECTED Final   Cryptococcus neoformans/gattii NOT DETECTED NOT DETECTED Final   CTX-M ESBL NOT DETECTED NOT DETECTED Final   Carbapenem resistance IMP NOT DETECTED NOT DETECTED Final   Carbapenem resistance KPC NOT DETECTED NOT DETECTED Final   Carbapenem resistance NDM NOT DETECTED NOT DETECTED Final   Carbapenem resistance VIM NOT DETECTED NOT DETECTED Final    Comment: Performed at [facility], [address], [place], [place] [address]  C Difficile Quick Screen w PCR reflex     Status: Abnormal   Collection Time: 07/03/20  7:01 PM   Specimen: STOOL  Result Value Ref Range Status   C Diff antigen POSITIVE (A) NEGATIVE Final   C Diff toxin NEGATIVE NEGATIVE Final   C Diff interpretation Results are indeterminate. See PCR results.  Final    Comment: Performed at [facility], [address] [place], [place] [address]  Gastrointestinal Panel by PCR , Stool     Status: None   Collection Time: 07/03/20  7:01 PM   Specimen: STOOL  Result Value Ref Range Status   Campylobacter species NOT DETECTED NOT DETECTED Final   Plesimonas shigelloides NOT DETECTED NOT DETECTED Final   Salmonella species NOT DETECTED NOT DETECTED Final   Yersinia enterocolitica NOT DETECTED NOT DETECTED Final   Vibrio species NOT  DETECTED NOT DETECTED Final   Vibrio cholerae NOT DETECTED NOT DETECTED Final   Enteroaggregative E coli (EAEC) NOT DETECTED NOT DETECTED Final   Enteropathogenic E coli (EPEC) NOT DETECTED NOT DETECTED Final   Enterotoxigenic E coli (ETEC) NOT DETECTED NOT DETECTED Final   Shiga like toxin producing E coli (STEC) NOT DETECTED NOT DETECTED Final   Shigella/Enteroinvasive E coli (EIEC) NOT DETECTED NOT DETECTED Final   Cryptosporidium NOT DETECTED NOT DETECTED Final   Cyclospora cayetanensis NOT DETECTED NOT DETECTED Final  Entamoeba histolytica NOT DETECTED NOT DETECTED Final   Giardia lamblia NOT DETECTED NOT DETECTED Final   Adenovirus F40/41 NOT DETECTED NOT DETECTED Final   Astrovirus NOT DETECTED NOT DETECTED Final   Norovirus GI/GII NOT DETECTED NOT DETECTED Final   Rotavirus A NOT DETECTED NOT DETECTED Final   Sapovirus (I, II, IV, and V) NOT DETECTED NOT DETECTED Final    Comment: Performed at [facility], [address], [place], [place] [address]  C. Diff by PCR, Reflexed     Status: None   Collection Time: 07/03/20  7:01 PM  Result Value Ref Range Status   Toxigenic C. Difficile by PCR NEGATIVE NEGATIVE Final    Comment: Patient is colonized with non toxigenic C. difficile. May not need treatment unless significant symptoms are present. Performed at [facility], [place] [address], [place], [place] [address]   Culture, blood (routine x 2)     Status: None (Preliminary result)   Collection Time: 07/04/20  9:43 AM   Specimen: BLOOD LEFT HAND  Result Value Ref Range Status   Specimen Description   Final    BLOOD LEFT HAND Performed at [facility] [address], [place], [place] [address]    Special Requests   Final    BOTTLES DRAWN AEROBIC ONLY Blood Culture adequate volume Performed at [facility] [address], [place], [place] [address]    Culture   Final    NO GROWTH 4 DAYS Performed at [place], [place] [address] [place], [place] [address]    Report Status PENDING  Incomplete  Culture, blood (routine x 2)     Status: None (Preliminary result)   Collection Time: 07/04/20  9:43 AM   Specimen: BLOOD LEFT HAND  Result Value Ref Range Status   Specimen Description   Final    BLOOD LEFT HAND Performed at [facility] [address] [place], [place] [address]    Special Requests   Final    BOTTLES DRAWN AEROBIC ONLY Blood Culture adequate volume Performed at [facility] [address], [place], [place] [address]    Culture   Final    NO GROWTH 4 DAYS Performed at [facility], [place] [address], [place], [place] [address]    Report Status PENDING  Incomplete    Serology:  Imaging: If present, new imagings (plain films, ct scans, and mri) have been personally visualized and interpreted; radiology reports have been reviewed. Decision making incorporated into the Impression / Recommendations.  3/24 cxr The heart size and mediastinal contours are mildly enlarged. A right-sided MediPort catheter seen with the tip at the superior cavoatrial junction. Both lungs are clear. The visualized skeletal structures are unremarkable.  3/25 abd xray The bowel gas pattern is normal. There is no evidence of free air. No radio-opaque calculi or other significant radiographic abnormality is seen.  3/25 renal u/s 1. No obstructive uropathy. 2. Bilateral renal parenchymal thinning and increased echogenicity consistent with chronic medical renal disease. 3. Echogenic bladder wall thickening in the right bladder, corresponding to that seen on prior PET CT, at site of hypermetabolism prior PET.  3/28 u/s soft tissue chest No evidence abscess  Jabier Mutton, [place] for Infectious [place] [phone] pager    07/08/2020, 11:26 AM

## 2020-07-08 NOTE — Progress Notes (Signed)
PROGRESS NOTE    [name] Rose  GGE:[id number] DOB: 08-05-1950 DOA: 07/02/2020 PCP: Shirline Frees, MD   Brief Narrative:  The patient is a 70 year old obese Caucasian male with a past medical history significant for but not limited to bladder cancer, chronic atrial fibrillation, CKD stage V, history of chronic anemia secondary to chronic kidney disease, hypertension as well as other comorbidities who presented via EMS for generalized weakness.  He reportedly was at home became very weak and was not able to stand and was at his desk and was unable to get up to go to the bedroom to lay down and EMS was called.  He states that he is globally weak and did not notice any difference in the strength left right extremities.  He stated that he felt better laying down and states he been eating and drinking but reports that it is less than last few days.  He was started on new medication by his nephrologist but does not know what it was.  He is currently on chemotherapy for his bladder cancer last chemo dose was on 06/30/2020 with Ivette Loyal.  Further work-up revealed the patient was pancytopenic on admission and significantly neutropenic.  He was transfused platelets in the ED given his thrombocytopenia secondary to his mild nosebleed and hematology oncology was consulted and recommending transfusion of PRBCs now given his low blood count as well.  He is currently on anticoagulation for his chronic atrial fibrillation.   Patient's work-up has shown that he was septic secondary to a Pseudomonas bacteremia now and he had been placed on double Pseudomonas coverage with ciprofloxacin and cefepime by heme-onc but we consulted Infectious Diseases to adjust his antibiotic regimen and evaluate.  Nephrology was also consulted given his worsening renal function in the setting of his diarrhea.  His renal function is improving with IV fluid hydration.  His white blood cell count continues to be extremely low and his platelet  count has dropped to 9 now but he continues to receive G-CSF.    His metabolic acidosis was worsening so will defer to nephrology for assistance.  He was found to have nontoxigenic C. difficile consistent with colonization in the setting of his diarrhea but since it is worsened we will start empirically treat given his continued significant bowel movements.  Infectious diseases has narrowed his antibiotics just to cefepime now and had recommended having his Port-A-Cath removed.  General surgery was consulted for Port-A-Cath removal and they were recommending they will do this once his platelet count is higher.  He continues to have volume loss with his diarrhea and nephrology adjusting fluids.   Patient's Blood count has been slowly dropping and Platelet Count only improved to 13 the day before yesterday from 5 and today is back down to 6. Oncology transfused 2 units of pRBC's today and are going to be transfusing 1 pack of Platelets as well. He continues to have significant diarrhea so his electrolytes decreased so will replete. General Surgery recommending not Removing Port-A-Cath at this time given his significant issues and because unable to get Platelet Count better. ID following carefully and will be getting an Axillary U/S to r/o Soft Tissue Abscess. ID recommends continuing 10 days of po Vancomycin and C/w IV Cefepime for 14 days from Last Negative Blood Cx. 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 (HCC)   Thrombocytopenia (HCC)   Bacteremia due to Pseudomonas   Essential hypertension   Chronic atrial fibrillation (HCC)   Neutropenia (HCC)   CKD (chronic kidney disease) stage 5, GFR less than 15 ml/min (HCC)   Weakness  generalized   Anemia of chronic disease   Abscess  Pancytopenia secondary to Antineoplastic Therapy -Patient presented with a neutropenia, thrombocytopenia and anemia; see below -He was evaluated by Oncolocgy; status post day 8 of Trodelvy on 06/10/2020 and received G-CSF with chemo and they feel that his WBC will slowly recover the next 2 days -He is severely neutropenic and they feel he has an infection in the left axilla concerning for early cellulitis but due to severe neutropenia medical oncology has started the patient on IV cefepime -Patient's WBC is now 0.1, Hgb/Hct is trended down to  7.3/21.6 yesterday but he was transfused 2 units and it is now 9.3/27.4, and Platelet Count trended down to 6 so will get another transfusion -Patient to get another unit of Platelets today per Onc -Patient is already Transfused 4 units of Platelets this Admission and will be Transfused total of 3 units of pRBCs (Receiving 2 today) during this Hospitalization -Oncology starting the patient on Granix  -Further care per medical oncology -Continues to get Tbo-Filgrastim 480 mcg   Neutropenia  -[name] is admitted to telemetry floor. Profound neutropenia which is due to chemotherapy but now has a Pseudomonas Bacteremia. Last chemotherapy was on 06/30/20.  -Oncology consulted and recommending Abx and feel his WBC will recover slowly in the next few days -No fever. There were No signs of infection initially but it seems like he has a Cellulitis in the Left Axilla. Blood cultures obtained in ER and will monitor. -Patient's WBC has gone from 7.8 -> 3.6 -> 0.1-> <0.1 and today is 0.1 -Continue to Monitor and Trend -Repeat CBC in the AM  Sepsis 2/2 Pseudomonas Bacteremia and Left Axilla Cellulitis, poA -Presented with Sepsis Physiology and was Tachycardic, Tachypenic, Neutropenic and has a source of Infection int he Blood and Cellulitis  -Patient's Blood Cx 1/4 Grew out Pseudomonas -Repeat Blood Cx 07/04/20  showing NGTD  -On IV Cefepime and Heme/Onc added IV Ciprofloxacin; ID has narrowed just to Cefepime -Patient has a Port and likely will need to be removed but cannot be done safely  -Given IVF with NS received a bolus yesterday per Nephrology; Maintenance IVF per Nephrology  -U/A done and showed a clear appearance with 150 glucose, large hemoglobin, 100 protein, rare bacteria, greater than 50 RBCs per high-power field and urine WBCs 0-5 -ID Consulted for further evaluation I appreciate Dr. Storm Frisk evaluation; ID recommends removal of Port-A-Cath and treating with IV Cefepime monotherapy but port cannot be safely removed so will remain in place patient to have 14 days total  -We will start po Vancomycin for Treatment of C Diff -General Surgery consulted for further Evaluation for Removal of Port-A-Cath  Diarrhea in the setting of C Diff, poA and worsening -Has had multiple Bowel Movements -C Diff Ag + but Has Negative C Diff Toxin and Negative Toxogenic C Diff via PCR -GI Pathogen Panel Negative -Patient has nontoxigenic C. difficile and is colonization and ID recommending treating if only becomes worsened and since it has worsened will start po Vancomycin Tx Dose and continue for 10 days  -Will not place Flexiseal given Neutropenia  -Continues  to have Severe Diarrhea -C/w Supportive Care  Bladder Cancer metastasized to intra-abdominal lymph nodes -Pt is followed by oncology and on chemotherapy. -See Above Heme/Onc following   Thrombocytopenia -Platelet count less than 20,000 with mild nose bleed and hematuria. Platelet transfusion ordered by ER.  -Platelet Count went from 115 -> 37 -> 17 -> 24 (after Transfusion) -> 12 -> 9 -> 5 -> 13 (after Transfusion of 2 packs of Platelets) -> 6 units and will be transfused again today  -S/p Transfusion of 4 Packs of Platelets already  -Followed by Heme/Onc. -Continue to Monitor for S/Sx of Bleeding -Repeat CBC in the AM   Essential  Hypertension -Stable. Pt currently off all antihypertensives.  -Continue to Monitor BP per Protocol -Last BP was 133/56  Chronic Atrial Fibrillation  -Chronic. Anticoagulation held with profound thrombocytopenia until Platelet Count is at least >50,000 -Continue to Monitor on Telemetry  AKI on CKD (chronic kidney disease) stage 5, GFR less than 15 ml/min  Metabolic Acidosis, slowly improving  Hyperphosphatemia -Now worsening in the setting of Diarrhea -Patient's BUN/Cr went from 62/6.42 -> 82/6.00 -> 96/6.35 -> 96/6.88 -> 86/5.91 -> 92/6.71 -> 91/6.20 -> 95/6.30 -> 97/6.07 -Patient's Acidosis is improving and now CO2 is 16, AG is 13, Chlorde Level is 103 -Patient's Phos Level was increased and is now 5.1 -> 4.6 -Avoid Nephrotoxic Medications, Contrast Dyes, Hypotension and Renally Adjust Medications -Nephrology consulted for further evaluation and recommendations and patient has no uremic signs and nephrology feels that he is a little bit dry; Fluid Management per Nephrology Recc's; His Sodium Bicarbonate gtt at 125 mL/hr now changed to LR at 125 mL/hr yesterday and agreed to be changing to 75 MLS per hour today and also will get Sodium Bicarbonate Tab 1300 mg po BID  -Appreciate nephrology assistance and will follow the recommendations; His AVF Creation has been delayed due to Acute illness and Nephrology feels that there is no urgent indication for HD   Hypomagnesemia -Patient's Mag Level is now 1.7 yesterday and today is 1.9 -Repelte with IV Mag Sulfate 2 Grams  yesterday -Continue to Monitor and Replete as Necessary -Repeat Mag Level in the AM  Hypokalemia -Patient's K+ was 3.1 this AM -Replete with po KCl 30 mEQ x1 and with IV KCl 40 mEQ x1 -Continue to Monitor and Replete as Necessary -Repeat CMP in the AM   Generalized Debility and Weakness  -Consult PT for evaluation.  -C/w Fall precautions.  Hyponatremia -Paitent's Na+ is now 132 -Continue to Monitor and  trend -Nephrology adjusting IVF and appreciate their assistance; Now on LR at 125 mL/hr -Repeat CMP in the AM  Anemia of Chronic disease/Macrocytic Anemia -Patient's Hgb/Hct went from 10.2/31.1 -> 9.4/28.8 -> 8.6/25.8 -> 7.7/23.4 -> 7.5/22.0 -> 8.3/24.6 -> 7.6/22.7 -> 7.3/21.6 -> 9.3/27.4 after two units of pRBC's -Heme/Onc recommends transfusion of pRBCs to keep Hgb/Hct >8; Transfused 3 units of pRBCs so far -Continue to Monitor for S/Sx of Bleeding; Had some Hematuria (U/A) and Epistaxis  -Repeat CBC in the AM  Obesity -Complicates overall prognosis and Care -Estimated body mass index is 32.78 kg/m as calculated from the following:   Height as of this encounter: $RemoveBeforeD'6\' 2"'PqlnUyChwlgRwn$  (1.88 m).   Weight as of this encounter: 115.8 kg. -Weight Loss and Dietary Counseling given  -Nutritionist consulted and recommending Beneprotein TIDwm and Prosource Plus po BID  DVT prophylaxis: SCDs given Anemia and Thrombocytopenia  Code Status: FULL CODE Family Communication: Discussed with wife at bedside  Disposition Plan: Pending further  Clinical Workup and improvement and clearance by Oncology, ID, Nephrology and General Surgery; PT/OT recommending home Health PT  Status is: Inpatient  Remains inpatient appropriate because:Unsafe d/c plan, IV treatments appropriate due to intensity of illness or inability to take PO and Inpatient level of care appropriate due to severity of illness   Dispo: The patient is from: Home               Anticipated d/c is to: Home              Patient currently is medically stable to d/c.   Difficult to place patient No  Consultants:   Medical Oncology  Nephrology  Infectious Diseases   General Surgery    Procedures: None  Antimicrobials:  Anti-infectives (From admission, onward)   Start     Dose/Rate Route Frequency Ordered Stop   07/06/20 1000  vancomycin (VANCOCIN) 50 mg/mL oral solution 125 mg        125 mg Oral 4 times daily 07/06/20 0858 07/16/20 0959    07/04/20 1000  ciprofloxacin (CIPRO) IVPB 400 mg  Status:  Discontinued        400 mg 200 mL/hr over 60 Minutes Intravenous Every 24 hours 07/04/20 0848 07/05/20 1550   07/03/20 1300  ceFEPIme (MAXIPIME) 2 g in sodium chloride 0.9 % 100 mL IVPB        2 g 200 mL/hr over 30 Minutes Intravenous Every 24 hours 07/03/20 1153          Subjective: Seen and examined at bedside and states he still feels terrible and rough.  Continues to have significant amount of diarrhea and states that he is up all night.  Feels very fatigued and tired.  No chest pain, lightheadedness or dizziness.  No abdominal pain.  No other concerns or complaints at this time.  Objective: Vitals:   07/08/20 0600 07/08/20 0909 07/08/20 0946 07/08/20 1141  BP: 138/82 137/90 (!) 141/88 (!) 133/56  Pulse: (!) 108 (!) 108 (!) 110 (!) 110  Resp: (!) $RemoveB'22 19 20 20  'oPGFfhtY$ Temp: 98.3 F (36.8 C) 98.4 F (36.9 C) 98.3 F (36.8 C) (!) 97.3 F (36.3 C)  TempSrc: Oral Oral  Oral  SpO2:  99%  100%  Weight:      Height:        Intake/Output Summary (Last 24 hours) at 07/08/2020 1257 Last data filed at 07/08/2020 1141 Gross per 24 hour  Intake 2457.08 ml  Output 2050 ml  Net 407.08 ml   Filed Weights   07/05/20 0500 07/06/20 0555 07/08/20 0500  Weight: 115.3 kg 114.1 kg 115.8 kg   Examination: Physical Exam:  Constitutional: WN/WD chronically ill-appearing Caucasian male currently in mild distress appears uncomfortable Eyes: Lids and conjunctivae normal, sclerae anicteric  ENMT: External Ears, Nose appear normal. Grossly normal hearing. Neck: Appears normal, supple, no cervical masses, normal ROM, no appreciable thyromegaly; no JVD Respiratory: Diminished to auscultation bilaterally, no wheezing, rales, rhonchi or crackles. Normal respiratory effort and patient is not tachypenic. No accessory muscle use.  Wearing supplemental oxygen via nasal cannula Cardiovascular: RRR, no murmurs / rubs / gallops. S1 and S2 auscultated.  Trace  extremity edema Abdomen: Soft, non-tender, distended secondary to body habitus. Bowel sounds positive.  GU: Deferred. Musculoskeletal: No clubbing / cyanosis of digits/nails. No joint deformity upper and lower extremities.  Skin: No rashes, lesions, ulcers on limited skin evaluation. No induration; Warm and dry.  Neurologic: CN 2-12 grossly intact with no focal deficits. Romberg sign  and cerebellar reflexes not assessed.  Psychiatric: Normal judgment and insight. Alert and oriented x 3. Normal mood and appropriate affect.    Data Reviewed: I have personally reviewed following labs and imaging studies  CBC: Recent Labs  Lab 07/03/20 0009 07/03/20 0757 07/04/20 0537 07/05/20 1054 07/06/20 0528 07/07/20 0506 07/08/20 0519  WBC 0.1*   < > <0.1* <0.1* <0.1* <0.1* 0.1*  NEUTROABS 0.0*  --   --   --  Not Measured  --   --   HGB 8.6*   < > 7.5* 8.3* 7.6* 7.3* 9.3*  HCT 25.8*   < > 22.0* 24.6* 22.7* 21.6* 27.4*  MCV 98.5   < > 96.9 97.6 97.0 94.7 93.2  PLT 17*   < > 12* 9* 5* 13* 6*   < > = values in this interval not displayed.   Basic Metabolic Panel: Recent Labs  Lab 07/04/20 0537 07/05/20 1054 07/06/20 0528 07/07/20 0506 07/07/20 1325 07/08/20 0519  NA 129* 129*  127* 130* 132* 133* 132*  K 3.9 3.2*  3.2* 3.9 2.8* 3.0* 3.1*  CL 102 104  103 106 102 103 103  CO2 17* 13*  14* 15* 17* 18* 16*  GLUCOSE 128* 158*  162* 135* 127* 146* 156*  BUN 96* 89*  86* 92* 91* 95* 97*  CREATININE 6.88* 6.77*  5.91* 6.71* 6.20* 6.30* 6.07*  CALCIUM 7.5* 7.7*  7.7* 8.1* 7.6* 7.7* 7.7*  MG 1.4* 1.7  1.7 1.9 1.7  --  1.9  PHOS 4.9* 4.2  4.1 4.3 5.1*  --  4.6   GFR: Estimated Creatinine Clearance: 15.7 mL/min (A) (by C-G formula based on SCr of 6.07 mg/dL (H)). Liver Function Tests: Recent Labs  Lab 07/04/20 0537 07/05/20 1054 07/06/20 0528 07/07/20 0506 07/08/20 0519  AST $Re'18 15  15 'WbY$ 14* 15 12*  ALT $Re'26 23  22 25 26 22  'vrc$ ALKPHOS 34* 32*  32* 29* 28* 27*  BILITOT 0.9 1.0   0.7 1.0 1.0 1.6*  PROT 5.4* 5.4*  5.4* 5.0* 4.8* 4.8*  ALBUMIN 2.6* 2.5*  2.5* 2.2* 2.1* 2.1*   No results for input(s): LIPASE, AMYLASE in the last 168 hours. No results for input(s): AMMONIA in the last 168 hours. Coagulation Profile: No results for input(s): INR, PROTIME in the last 168 hours. Cardiac Enzymes: No results for input(s): CKTOTAL, CKMB, CKMBINDEX, TROPONINI in the last 168 hours. BNP (last 3 results) No results for input(s): PROBNP in the last 8760 hours. HbA1C: No results for input(s): HGBA1C in the last 72 hours. CBG: No results for input(s): GLUCAP in the last 168 hours. Lipid Profile: No results for input(s): CHOL, HDL, LDLCALC, TRIG, CHOLHDL, LDLDIRECT in the last 72 hours. Thyroid Function Tests: No results for input(s): TSH, T4TOTAL, FREET4, T3FREE, THYROIDAB in the last 72 hours. Anemia Panel: No results for input(s): VITAMINB12, FOLATE, FERRITIN, TIBC, IRON, RETICCTPCT in the last 72 hours. Sepsis Labs: Recent Labs  Lab 07/03/20 0132  LATICACIDVEN 1.8    Recent Results (from the past 240 hour(s))  Blood culture (routine x 2)     Status: None   Collection Time: 07/03/20  1:28 AM   Specimen: BLOOD  Result Value Ref Range Status   Specimen Description   Final    BLOOD RIGHT ANTECUBITAL Performed at [name] [address] [place], [place] [address]    Special Requests   Final    BOTTLES DRAWN AEROBIC AND ANAEROBIC Blood Culture results may not be optimal  due to an excessive volume of blood received in culture bottles Performed at [facility] [address] [place], [place] [address]    Culture   Final    NO GROWTH 5 DAYS Performed at [facility], [place] [address] [place], [place] [address]    Report Status 07/08/2020 FINAL  Final  Blood culture (routine x 2)     Status: Abnormal   Collection Time: 07/03/20  1:33 AM   Specimen: BLOOD  Result Value Ref Range Status   Specimen Description    Final    BLOOD RIGHT HAND Performed at [facility] [address], [place], [place] [address]    Special Requests   Final    BOTTLES DRAWN AEROBIC AND ANAEROBIC Blood Culture results may not be optimal due to an excessive volume of blood received in culture bottles Performed at [facility] [address] [place], [place] [address]    Culture  Setup Time   Final    AEROBIC BOTTLE ONLY GRAM NEGATIVE RODS CRITICAL RESULT CALLED TO, READ BACK BY AND VERIFIED WITHSeleta Rhymes [facility] 07/04/20 0252 JDW Performed at [place] [facility], [place] [address] [place], [place] [address]    Culture PSEUDOMONAS AERUGINOSA (A)  Final   Report Status 07/05/2020 FINAL  Final   Organism ID, Bacteria PSEUDOMONAS AERUGINOSA  Final      Susceptibility   Pseudomonas aeruginosa - MIC*    CEFTAZIDIME 4 SENSITIVE Sensitive     CIPROFLOXACIN <=0.25 SENSITIVE Sensitive     GENTAMICIN <=1 SENSITIVE Sensitive     IMIPENEM 2 SENSITIVE Sensitive     PIP/TAZO 8 SENSITIVE Sensitive     CEFEPIME 2 SENSITIVE Sensitive     * PSEUDOMONAS AERUGINOSA  Resp Panel by RT-PCR (Flu A&B, Covid) Nasopharyngeal Swab     Status: None   Collection Time: 07/03/20  1:33 AM   Specimen: Nasopharyngeal Swab; Nasopharyngeal(NP) swabs in vial transport medium  Result Value Ref Range Status   SARS Coronavirus 2 by RT PCR NEGATIVE NEGATIVE Final    Comment: (NOTE) SARS-CoV-2 target nucleic acids are NOT DETECTED.  The SARS-CoV-2 RNA is generally detectable in upper respiratory specimens during the acute phase of infection. The lowest concentration of SARS-CoV-2 viral copies this assay can detect is 138 copies/mL. A negative result does not preclude SARS-Cov-2 infection and should not be used as the sole basis for treatment or other patient management decisions. A negative result may occur with  improper specimen collection/handling, submission of specimen other than nasopharyngeal swab, presence of  viral mutation(s) within the areas targeted by this assay, and inadequate number of viral copies(<138 copies/mL). A negative result must be combined with clinical observations, patient history, and epidemiological information. The expected result is Negative.  Fact Sheet for Patients:  EntrepreneurPulse.com.au  Fact Sheet for Healthcare Providers:  IncredibleEmployment.be  This test is no t yet approved or cleared by the [place] FDA and  has been authorized for detection and/or diagnosis of SARS-CoV-2 by FDA under an Emergency Use Authorization (EUA). This EUA will remain  in effect (meaning this test can be used) for the duration of the COVID-19 declaration under Section 564(b)(1) of the Act, 21 U.S.C.section 360bbb-3(b)(1), unless the authorization is terminated  or revoked sooner.       Influenza A by PCR NEGATIVE NEGATIVE Final   Influenza B by PCR NEGATIVE NEGATIVE Final    Comment: (NOTE) The Xpert Xpress SARS-CoV-2/FLU/RSV plus assay is intended  as an aid in the diagnosis of influenza from Nasopharyngeal swab specimens and should not be used as a sole basis for treatment. Nasal washings and aspirates are unacceptable for Xpert Xpress SARS-CoV-2/FLU/RSV testing.  Fact Sheet for Patients: EntrepreneurPulse.com.au  Fact Sheet for Healthcare Providers: IncredibleEmployment.be  This test is not yet approved or cleared by the [place] FDA and has been authorized for detection and/or diagnosis of SARS-CoV-2 by FDA under an Emergency Use Authorization (EUA). This EUA will remain in effect (meaning this test can be used) for the duration of the COVID-19 declaration under Section 564(b)(1) of the Act, 21 U.S.C. section 360bbb-3(b)(1), unless the authorization is terminated or revoked.  Performed at [facility], [place] [address], [place], [place] [address]   Blood Culture ID  Panel (Reflexed)     Status: Abnormal   Collection Time: 07/03/20  1:33 AM  Result Value Ref Range Status   Enterococcus faecalis NOT DETECTED NOT DETECTED Final   Enterococcus Faecium NOT DETECTED NOT DETECTED Final   Listeria monocytogenes NOT DETECTED NOT DETECTED Final   Staphylococcus species NOT DETECTED NOT DETECTED Final   Staphylococcus aureus (BCID) NOT DETECTED NOT DETECTED Final   Staphylococcus epidermidis NOT DETECTED NOT DETECTED Final   Staphylococcus lugdunensis NOT DETECTED NOT DETECTED Final   Streptococcus species NOT DETECTED NOT DETECTED Final   Streptococcus agalactiae NOT DETECTED NOT DETECTED Final   Streptococcus pneumoniae NOT DETECTED NOT DETECTED Final   Streptococcus pyogenes NOT DETECTED NOT DETECTED Final   A.calcoaceticus-baumannii NOT DETECTED NOT DETECTED Final   Bacteroides fragilis NOT DETECTED NOT DETECTED Final   Enterobacterales NOT DETECTED NOT DETECTED Final   Enterobacter cloacae complex NOT DETECTED NOT DETECTED Final   Escherichia coli NOT DETECTED NOT DETECTED Final   Klebsiella aerogenes NOT DETECTED NOT DETECTED Final   Klebsiella oxytoca NOT DETECTED NOT DETECTED Final   Klebsiella pneumoniae NOT DETECTED NOT DETECTED Final   Proteus species NOT DETECTED NOT DETECTED Final   Salmonella species NOT DETECTED NOT DETECTED Final   Serratia marcescens NOT DETECTED NOT DETECTED Final   Haemophilus influenzae NOT DETECTED NOT DETECTED Final   Neisseria meningitidis NOT DETECTED NOT DETECTED Final   Pseudomonas aeruginosa DETECTED (A) NOT DETECTED Final    Comment: CRITICAL RESULT CALLED TO, READ BACK BY AND VERIFIED WITH: E JACKSON PHARMD 07/04/20 0252 JDW    Stenotrophomonas maltophilia NOT DETECTED NOT DETECTED Final   Candida albicans NOT DETECTED NOT DETECTED Final   Candida auris NOT DETECTED NOT DETECTED Final   Candida glabrata NOT DETECTED NOT DETECTED Final   Candida krusei NOT DETECTED NOT DETECTED Final   Candida parapsilosis NOT  DETECTED NOT DETECTED Final   Candida tropicalis NOT DETECTED NOT DETECTED Final   Cryptococcus neoformans/gattii NOT DETECTED NOT DETECTED Final   CTX-M ESBL NOT DETECTED NOT DETECTED Final   Carbapenem resistance IMP NOT DETECTED NOT DETECTED Final   Carbapenem resistance KPC NOT DETECTED NOT DETECTED Final   Carbapenem resistance NDM NOT DETECTED NOT DETECTED Final   Carbapenem resistance VIM NOT DETECTED NOT DETECTED Final    Comment: Performed at [facility], [address], [place], [place] [address]  C Difficile Quick Screen w PCR reflex     Status: Abnormal   Collection Time: 07/03/20  7:01 PM   Specimen: STOOL  Result Value Ref Range Status   C Diff antigen POSITIVE (A) NEGATIVE Final   C Diff toxin NEGATIVE NEGATIVE Final   C Diff interpretation Results are indeterminate. See  PCR results.  Final    Comment: Performed at [facility], [address] [place], [place] [address]  Gastrointestinal Panel by PCR , Stool     Status: None   Collection Time: 07/03/20  7:01 PM   Specimen: STOOL  Result Value Ref Range Status   Campylobacter species NOT DETECTED NOT DETECTED Final   Plesimonas shigelloides NOT DETECTED NOT DETECTED Final   Salmonella species NOT DETECTED NOT DETECTED Final   Yersinia enterocolitica NOT DETECTED NOT DETECTED Final   Vibrio species NOT DETECTED NOT DETECTED Final   Vibrio cholerae NOT DETECTED NOT DETECTED Final   Enteroaggregative E coli (EAEC) NOT DETECTED NOT DETECTED Final   Enteropathogenic E coli (EPEC) NOT DETECTED NOT DETECTED Final   Enterotoxigenic E coli (ETEC) NOT DETECTED NOT DETECTED Final   Shiga like toxin producing E coli (STEC) NOT DETECTED NOT DETECTED Final   Shigella/Enteroinvasive E coli (EIEC) NOT DETECTED NOT DETECTED Final   Cryptosporidium NOT DETECTED NOT DETECTED Final   Cyclospora cayetanensis NOT DETECTED NOT DETECTED Final   Entamoeba histolytica NOT DETECTED NOT DETECTED Final   Giardia lamblia  NOT DETECTED NOT DETECTED Final   Adenovirus F40/41 NOT DETECTED NOT DETECTED Final   Astrovirus NOT DETECTED NOT DETECTED Final   Norovirus GI/GII NOT DETECTED NOT DETECTED Final   Rotavirus A NOT DETECTED NOT DETECTED Final   Sapovirus (I, II, IV, and V) NOT DETECTED NOT DETECTED Final    Comment: Performed at [facility], [address] [place], [place] [address]  C. Diff by PCR, Reflexed     Status: None   Collection Time: 07/03/20  7:01 PM  Result Value Ref Range Status   Toxigenic C. Difficile by PCR NEGATIVE NEGATIVE Final    Comment: Patient is colonized with non toxigenic C. difficile. May not need treatment unless significant symptoms are present. Performed at [facility], [address] [place], [place] [address]   Culture, blood (routine x 2)     Status: None (Preliminary result)   Collection Time: 07/04/20  9:43 AM   Specimen: BLOOD LEFT HAND  Result Value Ref Range Status   Specimen Description   Final    BLOOD LEFT HAND Performed at [facility], [address], [place], [place] [address]    Special Requests   Final    BOTTLES DRAWN AEROBIC ONLY Blood Culture adequate volume Performed at [facility], [address], [place], [place] [address]    Culture   Final    NO GROWTH 4 DAYS Performed at [facility], [address] [place], [place] [address]    Report Status PENDING  Incomplete  Culture, blood (routine x 2)     Status: None (Preliminary result)   Collection Time: 07/04/20  9:43 AM   Specimen: BLOOD LEFT HAND  Result Value Ref Range Status   Specimen Description   Final    BLOOD LEFT HAND Performed at [facility], [address] [place], [place] [address]    Special Requests   Final    BOTTLES DRAWN AEROBIC ONLY Blood Culture adequate volume Performed at [facility], [address] [place], [place] [address]    Culture   Final    NO GROWTH 4  DAYS Performed at [facility], [address], [place], [place] [address]    Report Status PENDING  Incomplete     RN Pressure Injury Documentation:  Estimated body mass index is 32.78 kg/m as calculated from the following:   Height as of this encounter: $RemoveBeforeD'6\' 2"'[id number]$  (1.88 m).   Weight as of this encounter: 115.8 kg.  Malnutrition Type:  Nutrition Problem: Increased nutrient needs Etiology: cancer and cancer related treatments   Malnutrition Characteristics:  Signs/Symptoms: estimated needs  Nutrition Interventions:  Interventions: Prostat,Refer to RD note for recommendations   Radiology Studies: [place] CHEST SOFT TISSUE  Result Date: 07/07/2020 CLINICAL DATA:  70 year old male with concern for left axillary abscess. EXAM: ULTRASOUND OF Left AXILLARY SOFT TISSUES TECHNIQUE: Ultrasound examination of the axillary soft tissues was performed in the area of clinical concern. COMPARISON:  None. FINDINGS: Mild subcutaneous edema is noted in the region of interest. There are no visible subcutaneous masses or fluid collections. IMPRESSION: Mild subcutaneous edema without evidence of underlying fluid collection in the left axilla. Electronically Signed   By: Ruthann Cancer MD   On: 07/07/2020 13:19   Scheduled Meds: . (feeding supplement) PROSource Plus  30 mL Oral BID BM  . chlorhexidine  15 mL Mouth/Throat QID  . Chlorhexidine Gluconate Cloth  6 each Topical Daily  . DULoxetine  60 mg Oral Daily  . Gerhardt's butt cream   Topical QID  . potassium chloride  40 mEq Oral Daily  . protein supplement  1 Scoop Oral TID WC  . sodium chloride flush  10-40 mL Intracatheter Q12H  . Tbo-Filgrastim  480 mcg Subcutaneous q1800  . vancomycin  125 mg Oral QID   Continuous Infusions: . ceFEPime (MAXIPIME) IV 2 g (07/07/20 1344)  . lactated ringers 125 mL/hr at 07/08/20 0219    LOS: 5 days   Kerney Elbe, DO [facility] PAGER is on [place]  If 7PM-7AM, please contact  night-coverage [email or web address]

## 2020-07-08 NOTE — Progress Notes (Signed)
[facility] Progress Note  Subjective: Patient states he is about the same today.  Appetite remains poor and diarrhea is persistent.  Overall no changes. Says he is trying to drink a lot.  Vitals:   07/08/20 0600 07/08/20 0909 07/08/20 0946 07/08/20 1141  BP: 138/82 137/90 (!) 141/88 (!) 133/56  Pulse: (!) 108 (!) 108 (!) 110 (!) 110  Resp: (!) _0 Temp: 98.3 F (36.8 C) 98.4 F (36.9 C) 98.3 F (36.8 C) (!) 97.3 F (36.3 C)  TempSrc: Oral Oral  Oral  SpO2:  99%  100%  Weight:      Height:        Exam: GEN: Chronically ill-appearing, lying in bed, nad ENT: no nasal discharge, mmm EYES: no scleral icterus, eomi CV: Tachycardic PULM: no iwob, bilateral chest rise ABD: NABS, non-distended SKIN: no rashes or jaundice EXT: no edema, warm and well perfused    Date                           Creat               eGFR   2011- 2016                0.7- 1.3   2018                          0.9- 1.6   2019                          1.0- 1.6   Jan -May 2020          1.40- 2.30   Late 2020                  1.40- 1.80        AKI 4.5 > 1.66, AKI 4.0 > 2.60   Jan -Sept 2021         1.9- 2.5   Oct -Dec 2021           2.4- 4.6            AKI 9.8 > 4.6 at dc   Feb 2022                   5.85   March 7                     6.27   March 14                   6.42   March 21- 25, 2022   6.00- 6.88   Home meds:  - amiodroane 200 qd/ eliquis 5 bid/ lipitor 40/ lasix 40 q mwf/ metoprolol 25 bid/ klor-con 20 qd/ sod bicarb 1300 bid  - pepcid 20 bid  - prn's/ vitamins/ supplements     UA 3/23 - clear 150 glu, large Hb, 100 prot, rare bact, >50 rbc, 0-5 wbc   cdif Ag +, toxin neg   CXR - IMPRESSION: No active disease.    BCID +pseudomonas aeruginosa / blood cx's 1 of 2 + for pseudomonas    Assessment/ Plan: 1. CKD V - baseline creat 5.8- 6.2 and creatinine has stayed near this baseline since admission.  Possibly slight worsening related to sepsis.  Patient follows with  Dr. Royce Macadamia outpatient.  Plan is to initiate  dialysis when needed. AVF creation was delayed due to acute illness. No urgent indication for HD at this time; trying to delay catheter placement given issues w/ bacteremia and neutropenia 2. Diarrhea/Dehydration. Diarrhea mgmt per primary. Decrease LR 75cc/hr; encourage PO hydration 3. Non-Anion Gap Metabolic acidosis - 2/2 diarrhea improving. Continue oral bicarbonate 1319m BID. 4. Neutropenic fevers - +pseudomonas bacteremia. Per pmd, on IV abx and IV granix.  Holding off on port removal for now. 5. Bladder cancer - per ONC, has been getting active chemoRx 6. Atrial fib - Slightly tachy but stable. Mgmt per primary 7. Pancytopenia - per primary, transfusions PRN.  8. Severe Hypokalemia: improved but persistent. 2/2 ongoing losses; largely GI. Continue with standing oral repletion and IV as needed   SReesa Chew 07/08/2020, 1:23 PM   Recent Labs  Lab 07/07/20 0506 07/07/20 1325 07/08/20 0519  K 2.8* 3.0* 3.1*  BUN 91* 95* 97*  CREATININE 6.20* 6.30* 6.07*  CALCIUM 7.6* 7.7* 7.7*  PHOS 5.1*  --  4.6  HGB 7.3*  --  9.3*   Inpatient medications: . (feeding supplement) PROSource Plus  30 mL Oral BID BM  . chlorhexidine  15 mL Mouth/Throat QID  . Chlorhexidine Gluconate Cloth  6 each Topical Daily  . DULoxetine  60 mg Oral Daily  . Gerhardt's butt cream   Topical QID  . potassium chloride  40 mEq Oral Daily  . protein supplement  1 Scoop Oral TID WC  . sodium chloride flush  10-40 mL Intracatheter Q12H  . Tbo-Filgrastim  480 mcg Subcutaneous q1800  . vancomycin  125 mg Oral QID   . ceFEPime (MAXIPIME) IV 2 g (07/08/20 1300)  . lactated ringers 125 mL/hr at 07/08/20 1258  . potassium chloride     acetaminophen **OR** acetaminophen, ondansetron **OR** ondansetron (ZOFRAN) IV, sodium chloride flush

## 2020-07-09 DIAGNOSIS — A09 Infectious gastroenteritis and colitis, unspecified: Secondary | ICD-10-CM | POA: Diagnosis not present

## 2020-07-09 DIAGNOSIS — D701 Agranulocytosis secondary to cancer chemotherapy: Secondary | ICD-10-CM | POA: Diagnosis not present

## 2020-07-09 DIAGNOSIS — A419 Sepsis, unspecified organism: Secondary | ICD-10-CM

## 2020-07-09 DIAGNOSIS — N179 Acute kidney failure, unspecified: Secondary | ICD-10-CM | POA: Diagnosis not present

## 2020-07-09 DIAGNOSIS — R7881 Bacteremia: Secondary | ICD-10-CM | POA: Diagnosis not present

## 2020-07-09 DIAGNOSIS — B965 Pseudomonas (aeruginosa) (mallei) (pseudomallei) as the cause of diseases classified elsewhere: Secondary | ICD-10-CM | POA: Diagnosis not present

## 2020-07-09 DIAGNOSIS — A4152 Sepsis due to Pseudomonas: Secondary | ICD-10-CM | POA: Diagnosis not present

## 2020-07-09 DIAGNOSIS — D61818 Other pancytopenia: Secondary | ICD-10-CM | POA: Diagnosis not present

## 2020-07-09 DIAGNOSIS — T451X5A Adverse effect of antineoplastic and immunosuppressive drugs, initial encounter: Secondary | ICD-10-CM | POA: Diagnosis not present

## 2020-07-09 LAB — PREPARE PLATELET PHERESIS: Unit division: 0

## 2020-07-09 LAB — COMPREHENSIVE METABOLIC PANEL
ALT: 22 U/L (ref 0–44)
AST: 14 U/L — ABNORMAL LOW (ref 15–41)
Albumin: 2 g/dL — ABNORMAL LOW (ref 3.5–5.0)
Alkaline Phosphatase: 23 U/L — ABNORMAL LOW (ref 38–126)
Anion gap: 13 (ref 5–15)
BUN: 111 mg/dL — ABNORMAL HIGH (ref 8–23)
CO2: 17 mmol/L — ABNORMAL LOW (ref 22–32)
Calcium: 8 mg/dL — ABNORMAL LOW (ref 8.9–10.3)
Chloride: 103 mmol/L (ref 98–111)
Creatinine, Ser: 6.32 mg/dL — ABNORMAL HIGH (ref 0.61–1.24)
GFR, Estimated: 9 mL/min — ABNORMAL LOW (ref >60)
Glucose, Bld: 125 mg/dL — ABNORMAL HIGH (ref 70–99)
Potassium: 2.9 mmol/L — ABNORMAL LOW (ref 3.5–5.1)
Sodium: 133 mmol/L — ABNORMAL LOW (ref 135–145)
Total Bilirubin: 1.8 mg/dL — ABNORMAL HIGH (ref 0.3–1.2)
Total Protein: 4.7 g/dL — ABNORMAL LOW (ref 6.5–8.1)

## 2020-07-09 LAB — MAGNESIUM: Magnesium: 1.9 mg/dL (ref 1.7–2.4)

## 2020-07-09 LAB — CULTURE, BLOOD (ROUTINE X 2)
Culture: NO GROWTH
Culture: NO GROWTH
Special Requests: ADEQUATE
Special Requests: ADEQUATE

## 2020-07-09 LAB — BPAM PLATELET PHERESIS
Blood Product Expiration Date: 202203302359
ISSUE DATE / TIME: 202203290917
Unit Type and Rh: 5100

## 2020-07-09 LAB — CBC WITH DIFFERENTIAL/PLATELET
HCT: 27.8 % — ABNORMAL LOW (ref 39.0–52.0)
Hemoglobin: 9.4 g/dL — ABNORMAL LOW (ref 13.0–17.0)
MCH: 31.6 pg (ref 26.0–34.0)
MCHC: 33.8 g/dL (ref 30.0–36.0)
MCV: 93.6 fL (ref 80.0–100.0)
Platelets: 7 10*3/uL — CL (ref 150–400)
RBC: 2.97 MIL/uL — ABNORMAL LOW (ref 4.22–5.81)
RDW: 15.6 % — ABNORMAL HIGH (ref 11.5–15.5)
WBC: 0.1 10*3/uL — CL (ref 4.0–10.5)
nRBC: 0 % (ref 0.0–0.2)

## 2020-07-09 LAB — PHOSPHORUS: Phosphorus: 5.5 mg/dL — ABNORMAL HIGH (ref 2.5–4.6)

## 2020-07-09 MED ORDER — POTASSIUM CHLORIDE 10 MEQ/100ML IV SOLN
10.0000 meq | INTRAVENOUS | Status: AC
  Administered 2020-07-09 (×2): 10 meq via INTRAVENOUS
  Filled 2020-07-09: qty 100

## 2020-07-09 MED ORDER — SODIUM CHLORIDE 0.9% IV SOLUTION: Freq: Once | INTRAVENOUS | Status: AC

## 2020-07-09 MED ORDER — AMIODARONE HCL 200 MG PO TABS
200.0000 mg | ORAL_TABLET | Freq: Every day | ORAL | Status: DC
  Administered 2020-07-09 – 2020-07-11 (×3): 200 mg via ORAL
  Filled 2020-07-09 (×4): qty 1

## 2020-07-09 MED ORDER — POTASSIUM CHLORIDE CRYS ER 20 MEQ PO TBCR
40.0000 meq | EXTENDED_RELEASE_TABLET | Freq: Two times a day (BID) | ORAL | Status: DC
  Administered 2020-07-09 – 2020-07-10 (×3): 40 meq via ORAL
  Filled 2020-07-09 (×3): qty 2

## 2020-07-09 MED ORDER — METOPROLOL TARTRATE 25 MG PO TABS
25.0000 mg | ORAL_TABLET | Freq: Two times a day (BID) | ORAL | Status: DC
  Administered 2020-07-09 – 2020-07-10 (×3): 25 mg via ORAL
  Filled 2020-07-09 (×3): qty 1

## 2020-07-09 MED ORDER — BIOTENE DRY MOUTH MT LIQD
15.0000 mL | OROMUCOSAL | Status: DC
  Administered 2020-07-11 – 2020-07-15 (×42): 15 mL via OROMUCOSAL
  Filled 2020-07-09: qty 15

## 2020-07-09 NOTE — Progress Notes (Signed)
PROGRESS NOTE  [name] Rose KCL:[id number] DOB: [birth year] DOA: 07/02/2020 PCP: Shirline Frees, MD  Brief History   70 year old man PMH including bladder cancer, CKD stage V, atrial fibrillation presented with generalized weakness.  Admitted for pancytopenia.  Found to have Pseudomonas bacteremia.  Nephrology consulted for worsening renal function.  Seen by oncology for pancytopenia.  Has required multiple blood products and has received Neupogen.  Found to have nontoxigenic C. difficile and started on treatment as per infectious disease.  Infectious disease has recommended removing Port-A-Cath however neither surgery nor oncology feel this is proven at this point.  A & P  Pancytopenia secondary to antineoplastic therapy.   Anemia of chronic disease Patient status post chemotherapy earlier this month.  Seen by oncology. --Continue transfusion blood products as per oncology including platelets and PRBCs. --Continue filgrastim as per oncology --No significant change in platelet count, hemoglobin or WBC today  Sepsis secondary to Pseudomonas bacteremia, left axilla cellulitis present on admission --Hemodynamic stable.  Continue antibiotic as per infectious disease.  Cellulitis appears to be improving.  Atrial fibrillation with rapid ventricular response, chronic atrial fibrillation  --Asymptomatic.  Restart home metoprolol and amiodarone which has not been given during hospitalization.  Monitor response.  AKI superimposed on CKD stage V with metabolic acidosis, hyperphosphatemia --Continue management per nephrology  Nontoxigenic C. 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  Disposition Plan:  Discussion: No significant  change of pancytopenia.  Oncology planning per the primary transfusion today.  Continue management per oncology and infectious disease.  Prognosis guarded.  Status is: Inpatient  Remains inpatient appropriate because:Ongoing diagnostic testing needed not appropriate for outpatient work up, IV treatments appropriate due to intensity of illness or inability to take PO and Inpatient level of care appropriate due to severity of illness   Dispo: The patient is from: Home              Anticipated d/c is to: Home              Patient currently is not medically stable to d/c.   Difficult to place patient No  DVT prophylaxis: SCDs Start: 07/03/20 1451   Code Status: Full Code Level of care: Telemetry Family Communication: wife at bedside  [name], MD  [facility] Direct contact: see [email or web address] (further directions at bottom of note if needed) 7PM-7AM contact night coverage as at bottom of note 07/09/2020, 6:12 PM  LOS: 6 days   Significant Hospital Events   . Admit 3/23    Consults:  . Oncology  . Nephrology . ID . General surgery    Procedures:  .   Significant Diagnostic Tests:  Marland Kitchen    Micro Data:  .    Antimicrobials:  .   Interval History/Subjective  CC: f/u pancytopenia  Feels okay today but appetite is very poor.  No nausea or vomiting.  Continues to have diarrhea.  Objective   Vitals:  Vitals:   07/09/20 1521 07/09/20 1722  BP: 124/77 (!) 111/59  Pulse: (!) 123 (!) 106  Resp: 18 20  Temp:  99.9 F (37.7 C)  SpO2: 100% 99%    Exam:  Constitutional:   . Appears calm and comfortable ENMT:  . grossly normal hearing  Respiratory:  . CTA bilaterally, no w/r/r.  Marland Kitchen  Respiratory effort normal Cardiovascular:  . RRR, no m/r/g . No LE extremity edema   Abdomen:  . Abdomen appears normal; no tenderness or masses . No hernias . No HSM Skin:  . Left chest wall appears to be healing in regard to abrasions.  Erythema decreasing Psychiatric:   . Mental status o Mood, affect appropriate  I have personally reviewed the following:   Today's Data  . Potassium 2.9 . Creatinine without significant change 6.32 . Troponin slightly elevated at 1.8 . Hemoglobin stable 9.4 . WBC stable 0.1 . Platelets without change, 7  Scheduled Meds: . (feeding supplement) PROSource Plus  30 mL Oral BID BM  . sodium chloride   Intravenous Once  . amiodarone  200 mg Oral Daily  . antiseptic oral rinse  15 mL Mouth Rinse Q2H  . chlorhexidine  15 mL Mouth/Throat QID  . Chlorhexidine Gluconate Cloth  6 each Topical Daily  . DULoxetine  60 mg Oral Daily  . Gerhardt's butt cream   Topical QID  . metoprolol tartrate  25 mg Oral BID  . potassium chloride  40 mEq Oral BID  . protein supplement  1 Scoop Oral TID WC  . sodium bicarbonate  1,300 mg Oral BID  . sodium chloride flush  10-40 mL Intracatheter Q12H  . Tbo-Filgrastim  480 mcg Subcutaneous q1800  . vancomycin  125 mg Oral QID   Continuous Infusions: . ceFEPime (MAXIPIME) IV 2 g (07/09/20 1217)  . potassium chloride 10 mEq (07/09/20 1747)    Principal Problem:   Pancytopenia ([place]) Active Problems:   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 (HCC)   Thrombocytopenia (HCC)   AKI (acute kidney injury) ([place])   Bacteremia due to Pseudomonas   Essential hypertension   Chronic atrial fibrillation (HCC)   Neutropenia (HCC)   CKD (chronic kidney disease) stage 5, GFR less than 15 ml/min (HCC)   Weakness generalized   Anemia of chronic disease   Sepsis ([place])   LOS: 6 days   How to contact the [facility] Attending or Consulting provider 7A - 7P or covering provider during after hours [place], for this patient?  1. Check the care team in [facility] and look for a) attending/consulting TRH provider listed and b) the [facility] team listed 2. Log into [email or web address] and use [place]'s universal password to access. If you do not have the password, please contact the hospital operator. 3. Locate  the [facility] provider you are looking for under [facility] and page to a number that you can be directly reached. 4. If you still have difficulty reaching the provider, please page the [facility] (Director on Call) for the Hospitalists listed on amion for assistance.

## 2020-07-09 NOTE — Progress Notes (Signed)
[facility] Progress Note  Subjective: Patient states no changes today.  Continues to have diarrhea and feels very tired.  Labs not back  Vitals:   07/08/20 1141 07/08/20 1424 07/08/20 2034 07/09/20 0520  BP: (!) 133/56 132/74 (!) 147/80 131/76  Pulse: (!) 110 98 (!) 110 (!) 105  Resp: '20 15 16 18  ' Temp: (!) 97.3 F (36.3 C) 97.7 F (36.5 C) 98.1 F (36.7 C) 97.6 F (36.4 C)  TempSrc: Oral Oral Oral Oral  SpO2: 100% 100% 100% 100%  Weight:    116.2 kg  Height:        Exam: GEN: Chronically ill-appearing, lying in bed, nad ENT: no nasal discharge, mmm EYES: no scleral icterus, eomi CV: Tachycardic PULM: no iwob, bilateral chest rise ABD: NABS, non-distended SKIN: no rashes or jaundice EXT: no edema, warm and well perfused    Date                           Creat               eGFR   2011- 2016                0.7- 1.3   2018                          0.9- 1.6   2019                          1.0- 1.6   Jan -May 2020          1.40- 2.30   Late 2020                  1.40- 1.80        AKI 4.5 > 1.66, AKI 4.0 > 2.60   Jan -Sept 2021         1.9- 2.5   Oct -Dec 2021           2.4- 4.6            AKI 9.8 > 4.6 at dc   Feb 2022                   5.85   March 7                     6.27   March 14                   6.42   March 21- 25, 2022   6.00- 6.88   Home meds:  - amiodroane 200 qd/ eliquis 5 bid/ lipitor 40/ lasix 40 q mwf/ metoprolol 25 bid/ klor-con 20 qd/ sod bicarb 1300 bid  - pepcid 20 bid  - prn's/ vitamins/ supplements     UA 3/23 - clear 150 glu, large Hb, 100 prot, rare bact, >50 rbc, 0-5 wbc   cdif Ag +, toxin neg   CXR - IMPRESSION: No active disease.    BCID +pseudomonas aeruginosa / blood cx's 1 of 2 + for pseudomonas    Assessment/ Plan: 1. CKD V - baseline creat 5.8- 6.2 and creatinine has stayed near this baseline since admission.  Possibly slight worsening related to sepsis.  Patient follows with Dr. Royce Macadamia outpatient.  Plan is to  initiate dialysis when needed. AVF creation was delayed due to acute illness.  No urgent indication for HD at this time; trying to delay catheter placement given issues w/ bacteremia and neutropenia 2. Diarrhea/Dehydration. Diarrhea mgmt per primary.  Continue lactated Ringer's 75 cc/h.  Encourage oral hydration.  Consider stopping fluids if kidney function is stable today. 3. Non-Anion Gap Metabolic acidosis - 2/2 diarrhea improving. Continue oral bicarbonate 1336m BID.  Follow-up labs today 4. Neutropenic fevers - +pseudomonas bacteremia. Per pmd, on IV abx and IV granix.  Holding off on port removal for now. 5. Bladder cancer - per ONC, has been getting active chemoRx 6. Atrial fib - Slightly tachy but stable. Mgmt per primary 7. Pancytopenia - per primary, transfusions PRN.  8. Severe Hypokalemia: improved but persistent. 2/2 ongoing losses; largely GI. Continue with standing oral repletion and IV as needed. follow-up labs today   SReesa Chew 07/09/2020, 10:47 AM   Recent Labs  Lab 07/07/20 0506 07/07/20 1325 07/08/20 0519 07/09/20 0840  K 2.8* 3.0* 3.1*  --   BUN 91* 95* 97*  --   CREATININE 6.20* 6.30* 6.07*  --   CALCIUM 7.6* 7.7* 7.7*  --   PHOS 5.1*  --  4.6 5.5*  HGB 7.3*  --  9.3*  --    Inpatient medications: . (feeding supplement) PROSource Plus  30 mL Oral BID BM  . antiseptic oral rinse  15 mL Mouth Rinse Q2H  . chlorhexidine  15 mL Mouth/Throat QID  . Chlorhexidine Gluconate Cloth  6 each Topical Daily  . DULoxetine  60 mg Oral Daily  . Gerhardt's butt cream   Topical QID  . potassium chloride  40 mEq Oral Daily  . protein supplement  1 Scoop Oral TID WC  . sodium bicarbonate  1,300 mg Oral BID  . sodium chloride flush  10-40 mL Intracatheter Q12H  . Tbo-Filgrastim  480 mcg Subcutaneous q1800  . vancomycin  125 mg Oral QID   . ceFEPime (MAXIPIME) IV 2 g (07/08/20 1300)  . lactated ringers 75 mL/hr at 07/09/20 02081  acetaminophen **OR**  acetaminophen, ondansetron **OR** ondansetron (ZOFRAN) IV, sodium chloride flush

## 2020-07-09 NOTE — Progress Notes (Signed)
PHYSICAL THERAPY  Pt looks poorly.  Has not been OOB.  Admits to not eating much.  MD in room.  Per chart review pt is not improving.  Will continue to follow.    Rica Koyanagi  PTA Acute  Rehabilitation Services Pager      [phone] Office      [phone]

## 2020-07-09 NOTE — Progress Notes (Signed)
Unfortunately, [name] is still dealing with neutropenia.  I do not have the results back yet from his labs today.  He still having some diarrhea.  He seems to be getting weaker.  He is not eating much at all.  His mouth is very dry.  Sounds like he may have C. difficile.  A lot of this is truly the case since his toxin was negative.  He is on oral vancomycin.  He continues on Neupogen for the neutropenia.  The atrial fibrillation does not seem to be flaring up on him which is important.  He just is getting weaker.  He is not really getting out of bed.  He is would not able to get out of bed.  His vital signs show temperature of 97.6.  Pulse 105.  Blood pressure 131/76.  I really do not see any problems with the Port-A-Cath right now.  I still do not believe that this needs to come out.  He has been afebrile.  He is not spiking temperatures.  He is not septic.  Again, this all comes down to the white cells going back up.  I really hope that he is not going to need to go to rehab.  I know that this is very complicated.  He got platelets yesterday.  I will have to see what his labs look like today.  I appreciate the wonderful care that he is getting from all the staff up on [address].  [name], MD  Psalms 119:36

## 2020-07-09 NOTE — Hospital Course (Addendum)
70 year old man PMH including bladder cancer, CKD stage V, atrial fibrillation presented with generalized weakness.  Admitted for pancytopenia and seen by oncology, required multiple blood products, has remained pancytopenic with life threatening thrombocytopenia.  WBC slowly trending up, hemoglobin stable.  Found to have Pseudomonas bacteremia, seen by infectious disease and treated with antibiotics.  Source likely skin.  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  Not a candidate for dialysis.  Encephalopathy secondary to uremia worsening, renal function worsening, failure to thrive with trivial oral intake, life-threatening thrombocytopenia.  Overall condition continues to worsen daily.  In discussion with wife and family, decision to proceed with full comfort care and residential hospice placement. Discussed in detail with [name].  We both agree and transition to comfort care and residential hospice.  Prognosis less than 2 weeks.  A & P  Pancytopenia secondary to antineoplastic therapy.   Anemia of chronic disease. Patient status post chemotherapy earlier this month.   --Life-threatening thrombocytopenia persists.  Hemoglobin stable and WBC improved. --Full comfort care now.  Sepsis secondary to Pseudomonas bacteremia, left axilla cellulitis present on admission --Hemodynamics remain stable.   --Stop antibiotics.  Full comfort care.  Atrial fibrillation with rapid ventricular response, chronic atrial fibrillation  --Full comfort care, stop medications.  AKI superimposed on CKD stage V with metabolic acidosis, hyperphosphatemia --Nonoliguric although urine output has decreased.  Renal function worsening. --Full comfort care  Acute metabolic encephalopathy secondary to uremia. --Encephalopathy worsening daily.  Nontoxigenic C. difficile diarrhea present on admission --ID recommended oral vancomycin for 10 days, stop now based on goals of care.  Bladder  cancer metastatic to intra-abdominal lymph nodes --Comfort care  Essential hypertension --  Generalized weakness  Hyponatremia --Resolved.  Now has hypernatremia.  Nephrology adjusting IV fluids.  Obesity unspecified Body mass index is 32.89 kg/m.

## 2020-07-09 NOTE — Progress Notes (Signed)
[facility]  Date of Admission:  07/02/2020     CC: sepsis/pseudomonal bacteremia  Lines: Chronic port (not accessed this admission) Peripheral iv's  Abx: 3/27-c po vanc 3/24-c cefepime 2 gram iv daily renally adjusted  3/25-26 cipro  Other: g-csf Trodelvy, last dose 3/21  ASSESSMENT: Sepsis/Pseudomonas bacteremia Left axillary induration/abscess Neutropenic fever Non-toxogenic cdiff colonization; diarrhea Presence of chronic Port S/p bilateral knee/left hip arthroplasty -- assymptomatic during bacteremic episode Bladder cancer with intraabd lymph node metastasis Chemotherapy associated pancytopenia Afib, on chronic DOAC (being hold for thrombocytopenia) CKD5  70 yo male with bilateral knee and left hip arthroplasty, recurrent metastatic bladder cancer, chronic port, on chemo with Ivette Loyal (last cycle 06/23/2020 -- day 8 on 3/21) admitted 3/23 for generalized weakness found to have sepsis/pseudomonal bacteremia complicated by severe neutropenia    #neutropenia #pseudomonas bacteremia #present of port Patient is afebrile here. Pancytopenia 3/23 admission bcx pseudomonas aeruginosa (pan-sensitive) 3/25 repeat bcx negative  He has left axillary area erythema which appear to be ssi. U/s soft tissue no abscess  definining complication of CLABSI rather trouble some in lots of situation. At this time, repeat bcx negative, no fever, and hemodynamically stable. Unable to get his platelet up. Left chest erythema could be source of pseudomonas getting in the blood stream. Again we don't have concrete evidence that the pseudomonas source/complication is central line assciated blood stream infection. Given, at the moment, uncertainty of this and high risk of morbidity/mortality removing the port at this time, it is reasonable to continue antibiotics and observe him clinically  If hemodynamic disturbance and recurrent pseudomonas bacteremia, the  port then would need to be removed   #diarrhea negative gi pcr Positive cdiff antigen but negative toxin -- I am not too sure I would call this cdiff associated diarrhea, and unclear if tx would help Due to copious diarrhea, primary team had started tx with po vanc 3/28 (they had previously discussed with dr Baxter Flattery who agreed with that plan) His chemo certainly have significantly high risk of causing diarrhea as well     PLAN:  1. Can finish 10 days po vancomycin on 07/16/2020 2. Continue cefepime for now. Will plan 14 days from last negative blood culture of 3/25 3. If hemodynamics disturbance, or fever, please repeat blood culture    Active Problems:   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 (HCC)   Thrombocytopenia (HCC)   Bacteremia due to Pseudomonas   Essential hypertension   Chronic atrial fibrillation (HCC)   Neutropenia (HCC)   CKD (chronic kidney disease) stage 5, GFR less than 15 ml/min (HCC)   Weakness generalized   Anemia of chronic disease   Abscess   Scheduled Meds: . (feeding supplement) PROSource Plus  30 mL Oral BID BM  . amiodarone  200 mg Oral Daily  . antiseptic oral rinse  15 mL Mouth Rinse Q2H  . chlorhexidine  15 mL Mouth/Throat QID  . Chlorhexidine Gluconate Cloth  6 each Topical Daily  . DULoxetine  60 mg Oral Daily  . Gerhardt's butt cream   Topical QID  . metoprolol tartrate  25 mg Oral BID  . potassium chloride  40 mEq Oral Daily  . protein supplement  1 Scoop Oral TID WC  . sodium bicarbonate  1,300 mg Oral BID  . sodium chloride flush  10-40 mL Intracatheter Q12H  . Tbo-Filgrastim  480 mcg Subcutaneous q1800  . vancomycin  125 mg Oral QID  Continuous Infusions: . ceFEPime (MAXIPIME) IV 2 g (07/09/20 1217)  . lactated ringers 75 mL/hr at 07/09/20 0609   PRN Meds:.acetaminophen **OR** acetaminophen, ondansetron **OR** ondansetron (ZOFRAN) IV, sodium chloride flush   SUBJECTIVE: Feels a little better today Still  lots of diarrhea; too weak to get off bed Had some cough as well unclear how long. No dyspnea No fever/chill Hemodynamics continue to be within normal limit/stable  No new rash  Continues to be severely neutropenic although today cbc not checked  Review of Systems: ROS Other ros negative  No Known Allergies  OBJECTIVE: Vitals:   07/08/20 1424 07/08/20 2034 07/09/20 0520 07/09/20 1432  BP: 132/74 (!) 147/80 131/76 113/70  Pulse: 98 (!) 110 (!) 105 (!) 136  Resp: $Remo'15 16 18 18  'SieYC$ Temp: 97.7 F (36.5 C) 98.1 F (36.7 C) 97.6 F (36.4 C)   TempSrc: Oral Oral Oral   SpO2: 100% 100% 100% 99%  Weight:   116.2 kg   Height:       Body mass index is 32.89 kg/m.  Physical Exam Acutely ill appearing; lethargic; conversant Heent: normocephalic; per; eomi; conj clear Neck supple cv rrr no mrg Lungs clear; normal respiratory effort abd s/nt Ext no edema Skin: left lateral hip site echymosis; stable slight nontender nonpruritic bilateral LE maculopapular rash; stable left axillary/chest is a 3 inch mildly tender induration area with superficial erythema and 2 spot of skin maceration Neuro: nonfocal; diffusely weak msk no swelling/tenderness bilateral knee/left hip joint; intact active/passive rom  Lab Results Lab Results  Component Value Date   WBC 0.1 (LL) 07/08/2020   HGB 9.3 (L) 07/08/2020   HCT 27.4 (L) 07/08/2020   MCV 93.2 07/08/2020   PLT 6 (LL) 07/08/2020    Lab Results  Component Value Date   CREATININE 6.32 (H) 07/09/2020   BUN 111 (H) 07/09/2020   NA 133 (L) 07/09/2020   K 2.9 (L) 07/09/2020   CL 103 07/09/2020   CO2 17 (L) 07/09/2020    Lab Results  Component Value Date   ALT 22 07/09/2020   AST 14 (L) 07/09/2020   ALKPHOS 23 (L) 07/09/2020   BILITOT 1.8 (H) 07/09/2020     Microbiology: Recent Results (from the past 240 hour(s))  Blood culture (routine x 2)     Status: None   Collection Time: 07/03/20  1:28 AM   Specimen: BLOOD  Result Value Ref  Range Status   Specimen Description   Final    BLOOD RIGHT ANTECUBITAL Performed at [facility], [place] [address], [place], [place] [address]    Special Requests   Final    BOTTLES DRAWN AEROBIC AND ANAEROBIC Blood Culture results may not be optimal due to an excessive volume of blood received in culture bottles Performed at [facility] [address] [place], [place] [address]    Culture   Final    NO GROWTH 5 DAYS Performed at [facility], [place] [address], [place], [place] [address]    Report Status 07/08/2020 FINAL  Final  Blood culture (routine x 2)     Status: Abnormal   Collection Time: 07/03/20  1:33 AM   Specimen: BLOOD  Result Value Ref Range Status   Specimen Description   Final    BLOOD RIGHT HAND Performed at [facility] [address] [place], [place] [address]    Special Requests   Final    BOTTLES DRAWN AEROBIC AND ANAEROBIC Blood Culture results  may not be optimal due to an excessive volume of blood received in culture bottles Performed at [facility] [address] [place], [place] [address]    Culture  Setup Time   Final    AEROBIC BOTTLE ONLY GRAM NEGATIVE RODS CRITICAL RESULT CALLED TO, READ BACK BY AND VERIFIED WITHSeleta Rhymes [facility] 07/04/20 0252 JDW Performed at [facility], [place] [address] [place], [place] [address]    Culture PSEUDOMONAS AERUGINOSA (A)  Final   Report Status 07/05/2020 FINAL  Final   Organism ID, Bacteria PSEUDOMONAS AERUGINOSA  Final      Susceptibility   Pseudomonas aeruginosa - MIC*    CEFTAZIDIME 4 SENSITIVE Sensitive     CIPROFLOXACIN <=0.25 SENSITIVE Sensitive     GENTAMICIN <=1 SENSITIVE Sensitive     IMIPENEM 2 SENSITIVE Sensitive     PIP/TAZO 8 SENSITIVE Sensitive     CEFEPIME 2 SENSITIVE Sensitive     * PSEUDOMONAS AERUGINOSA  Resp Panel by RT-PCR (Flu A&B, Covid) Nasopharyngeal Swab     Status: None   Collection Time: 07/03/20   1:33 AM   Specimen: Nasopharyngeal Swab; Nasopharyngeal(NP) swabs in vial transport medium  Result Value Ref Range Status   SARS Coronavirus 2 by RT PCR NEGATIVE NEGATIVE Final    Comment: (NOTE) SARS-CoV-2 target nucleic acids are NOT DETECTED.  The SARS-CoV-2 RNA is generally detectable in upper respiratory specimens during the acute phase of infection. The lowest concentration of SARS-CoV-2 viral copies this assay can detect is 138 copies/mL. A negative result does not preclude SARS-Cov-2 infection and should not be used as the sole basis for treatment or other patient management decisions. A negative result may occur with  improper specimen collection/handling, submission of specimen other than nasopharyngeal swab, presence of viral mutation(s) within the areas targeted by this assay, and inadequate number of viral copies(<138 copies/mL). A negative result must be combined with clinical observations, patient history, and epidemiological information. The expected result is Negative.  Fact Sheet for Patients:  EntrepreneurPulse.com.au  Fact Sheet for Healthcare Providers:  IncredibleEmployment.be  This test is no t yet approved or cleared by the [place] FDA and  has been authorized for detection and/or diagnosis of SARS-CoV-2 by FDA under an Emergency Use Authorization (EUA). This EUA will remain  in effect (meaning this test can be used) for the duration of the COVID-19 declaration under Section 564(b)(1) of the Act, 21 U.S.C.section 360bbb-3(b)(1), unless the authorization is terminated  or revoked sooner.       Influenza A by PCR NEGATIVE NEGATIVE Final   Influenza B by PCR NEGATIVE NEGATIVE Final    Comment: (NOTE) The Xpert Xpress SARS-CoV-2/FLU/RSV plus assay is intended as an aid in the diagnosis of influenza from Nasopharyngeal swab specimens and should not be used as a sole basis for treatment. Nasal washings and aspirates  are unacceptable for Xpert Xpress SARS-CoV-2/FLU/RSV testing.  Fact Sheet for Patients: EntrepreneurPulse.com.au  Fact Sheet for Healthcare Providers: IncredibleEmployment.be  This test is not yet approved or cleared by the [place] FDA and has been authorized for detection and/or diagnosis of SARS-CoV-2 by FDA under an Emergency Use Authorization (EUA). This EUA will remain in effect (meaning this test can be used) for the duration of the COVID-19 declaration under Section 564(b)(1) of the Act, 21 U.S.C. section 360bbb-3(b)(1), unless the authorization is terminated or revoked.  Performed at [facility], [place] [address] [place], [place] [address]   Blood Culture ID Panel (  Reflexed)     Status: Abnormal   Collection Time: 07/03/20  1:33 AM  Result Value Ref Range Status   Enterococcus faecalis NOT DETECTED NOT DETECTED Final   Enterococcus Faecium NOT DETECTED NOT DETECTED Final   Listeria monocytogenes NOT DETECTED NOT DETECTED Final   Staphylococcus species NOT DETECTED NOT DETECTED Final   Staphylococcus aureus (BCID) NOT DETECTED NOT DETECTED Final   Staphylococcus epidermidis NOT DETECTED NOT DETECTED Final   Staphylococcus lugdunensis NOT DETECTED NOT DETECTED Final   Streptococcus species NOT DETECTED NOT DETECTED Final   Streptococcus agalactiae NOT DETECTED NOT DETECTED Final   Streptococcus pneumoniae NOT DETECTED NOT DETECTED Final   Streptococcus pyogenes NOT DETECTED NOT DETECTED Final   A.calcoaceticus-baumannii NOT DETECTED NOT DETECTED Final   Bacteroides fragilis NOT DETECTED NOT DETECTED Final   Enterobacterales NOT DETECTED NOT DETECTED Final   Enterobacter cloacae complex NOT DETECTED NOT DETECTED Final   Escherichia coli NOT DETECTED NOT DETECTED Final   Klebsiella aerogenes NOT DETECTED NOT DETECTED Final   Klebsiella oxytoca NOT DETECTED NOT DETECTED Final   Klebsiella pneumoniae NOT DETECTED  NOT DETECTED Final   Proteus species NOT DETECTED NOT DETECTED Final   Salmonella species NOT DETECTED NOT DETECTED Final   Serratia marcescens NOT DETECTED NOT DETECTED Final   Haemophilus influenzae NOT DETECTED NOT DETECTED Final   Neisseria meningitidis NOT DETECTED NOT DETECTED Final   Pseudomonas aeruginosa DETECTED (A) NOT DETECTED Final    Comment: CRITICAL RESULT CALLED TO, READ BACK BY AND VERIFIED WITH: E JACKSON PHARMD 07/04/20 0252 JDW    Stenotrophomonas maltophilia NOT DETECTED NOT DETECTED Final   Candida albicans NOT DETECTED NOT DETECTED Final   Candida auris NOT DETECTED NOT DETECTED Final   Candida glabrata NOT DETECTED NOT DETECTED Final   Candida krusei NOT DETECTED NOT DETECTED Final   Candida parapsilosis NOT DETECTED NOT DETECTED Final   Candida tropicalis NOT DETECTED NOT DETECTED Final   Cryptococcus neoformans/gattii NOT DETECTED NOT DETECTED Final   CTX-M ESBL NOT DETECTED NOT DETECTED Final   Carbapenem resistance IMP NOT DETECTED NOT DETECTED Final   Carbapenem resistance KPC NOT DETECTED NOT DETECTED Final   Carbapenem resistance NDM NOT DETECTED NOT DETECTED Final   Carbapenem resistance VIM NOT DETECTED NOT DETECTED Final    Comment: Performed at [facility], [address] [place], [place] [address]  C Difficile Quick Screen w PCR reflex     Status: Abnormal   Collection Time: 07/03/20  7:01 PM   Specimen: STOOL  Result Value Ref Range Status   C Diff antigen POSITIVE (A) NEGATIVE Final   C Diff toxin NEGATIVE NEGATIVE Final   C Diff interpretation Results are indeterminate. See PCR results.  Final    Comment: Performed at [facility], [place] [address] [place], [place] [address]  Gastrointestinal Panel by PCR , Stool     Status: None   Collection Time: 07/03/20  7:01 PM   Specimen: STOOL  Result Value Ref Range Status   Campylobacter species NOT DETECTED NOT DETECTED Final   Plesimonas shigelloides NOT DETECTED NOT  DETECTED Final   Salmonella species NOT DETECTED NOT DETECTED Final   Yersinia enterocolitica NOT DETECTED NOT DETECTED Final   Vibrio species NOT DETECTED NOT DETECTED Final   Vibrio cholerae NOT DETECTED NOT DETECTED Final   Enteroaggregative E coli (EAEC) NOT DETECTED NOT DETECTED Final   Enteropathogenic E coli (EPEC) NOT DETECTED NOT DETECTED Final   Enterotoxigenic E coli (ETEC) NOT  DETECTED NOT DETECTED Final   Shiga like toxin producing E coli (STEC) NOT DETECTED NOT DETECTED Final   Shigella/Enteroinvasive E coli (EIEC) NOT DETECTED NOT DETECTED Final   Cryptosporidium NOT DETECTED NOT DETECTED Final   Cyclospora cayetanensis NOT DETECTED NOT DETECTED Final   Entamoeba histolytica NOT DETECTED NOT DETECTED Final   Giardia lamblia NOT DETECTED NOT DETECTED Final   Adenovirus F40/41 NOT DETECTED NOT DETECTED Final   Astrovirus NOT DETECTED NOT DETECTED Final   Norovirus GI/GII NOT DETECTED NOT DETECTED Final   Rotavirus A NOT DETECTED NOT DETECTED Final   Sapovirus (I, II, IV, and V) NOT DETECTED NOT DETECTED Final    Comment: Performed at [facility], [address], [place], [place] [address]  C. Diff by PCR, Reflexed     Status: None   Collection Time: 07/03/20  7:01 PM  Result Value Ref Range Status   Toxigenic C. Difficile by PCR NEGATIVE NEGATIVE Final    Comment: Patient is colonized with non toxigenic C. difficile. May not need treatment unless significant symptoms are present. Performed at [facility], [place] [address] [place], [place] [address]   Culture, blood (routine x 2)     Status: None   Collection Time: 07/04/20  9:43 AM   Specimen: BLOOD LEFT HAND  Result Value Ref Range Status   Specimen Description   Final    BLOOD LEFT HAND Performed at [facility] [address] [place], [place] [address]    Special Requests   Final    BOTTLES DRAWN AEROBIC ONLY Blood Culture adequate volume Performed at [facility] [address], [place], [place] [address]    Culture   Final    NO GROWTH 5 DAYS Performed at [facility], [place] [address] [place], [place] [address]    Report Status 07/09/2020 FINAL  Final  Culture, blood (routine x 2)     Status: None   Collection Time: 07/04/20  9:43 AM   Specimen: BLOOD LEFT HAND  Result Value Ref Range Status   Specimen Description   Final    BLOOD LEFT HAND Performed at [facility] [address], [place], [place] [address]    Special Requests   Final    BOTTLES DRAWN AEROBIC ONLY Blood Culture adequate volume Performed at [facility] [address] [place], [place] [address]    Culture   Final    NO GROWTH 5 DAYS Performed at [facility], [place] [address] [place],  [address]    Report Status 07/09/2020 FINAL  Final    Serology:  Imaging: If present, new imagings (plain films, ct scans, and mri) have been personally visualized and interpreted; radiology reports have been reviewed. Decision making incorporated into the Impression / Recommendations.  3/24 cxr The heart size and mediastinal contours are mildly enlarged. A right-sided MediPort catheter seen with the tip at the superior cavoatrial junction. Both lungs are clear. The visualized skeletal structures are unremarkable.  3/25 abd xray The bowel gas pattern is normal. There is no evidence of free air. No radio-opaque calculi or other significant radiographic abnormality is seen.  3/25 renal u/s 1. No obstructive uropathy. 2. Bilateral renal parenchymal thinning and increased echogenicity consistent with chronic medical renal disease. 3. Echogenic bladder wall thickening in the right bladder, corresponding to that seen on prior PET CT, at site of hypermetabolism prior PET.  3/28 u/s soft tissue chest  No evidence abscess  Jabier Mutton, [facility] [place] [phone] pager    07/09/2020, 3:08 PM

## 2020-07-10 DIAGNOSIS — D61818 Other pancytopenia: Secondary | ICD-10-CM

## 2020-07-10 DIAGNOSIS — A0472 Enterocolitis due to Clostridium difficile, not specified as recurrent: Secondary | ICD-10-CM | POA: Diagnosis not present

## 2020-07-10 DIAGNOSIS — D703 Neutropenia due to infection: Secondary | ICD-10-CM

## 2020-07-10 DIAGNOSIS — C679 Malignant neoplasm of bladder, unspecified: Secondary | ICD-10-CM | POA: Diagnosis not present

## 2020-07-10 DIAGNOSIS — R7881 Bacteremia: Secondary | ICD-10-CM | POA: Diagnosis not present

## 2020-07-10 DIAGNOSIS — D701 Agranulocytosis secondary to cancer chemotherapy: Secondary | ICD-10-CM | POA: Diagnosis not present

## 2020-07-10 DIAGNOSIS — N179 Acute kidney failure, unspecified: Secondary | ICD-10-CM | POA: Diagnosis not present

## 2020-07-10 LAB — PREPARE PLATELET PHERESIS: Unit division: 0

## 2020-07-10 LAB — COMPREHENSIVE METABOLIC PANEL
ALT: 25 U/L (ref 0–44)
AST: 19 U/L (ref 15–41)
Albumin: 1.8 g/dL — ABNORMAL LOW (ref 3.5–5.0)
Alkaline Phosphatase: 24 U/L — ABNORMAL LOW (ref 38–126)
Anion gap: 16 — ABNORMAL HIGH (ref 5–15)
BUN: 130 mg/dL — ABNORMAL HIGH (ref 8–23)
CO2: 14 mmol/L — ABNORMAL LOW (ref 22–32)
Calcium: 7.7 mg/dL — ABNORMAL LOW (ref 8.9–10.3)
Chloride: 104 mmol/L (ref 98–111)
Creatinine, Ser: 6.76 mg/dL — ABNORMAL HIGH (ref 0.61–1.24)
GFR, Estimated: 8 mL/min — ABNORMAL LOW (ref >60)
Glucose, Bld: 129 mg/dL — ABNORMAL HIGH (ref 70–99)
Potassium: 2.8 mmol/L — ABNORMAL LOW (ref 3.5–5.1)
Sodium: 134 mmol/L — ABNORMAL LOW (ref 135–145)
Total Bilirubin: 2.4 mg/dL — ABNORMAL HIGH (ref 0.3–1.2)
Total Protein: 4.5 g/dL — ABNORMAL LOW (ref 6.5–8.1)

## 2020-07-10 LAB — BPAM PLATELET PHERESIS
Blood Product Expiration Date: 202203312359
ISSUE DATE / TIME: 202203301947
Unit Type and Rh: 6200

## 2020-07-10 LAB — CBC WITH DIFFERENTIAL/PLATELET
HCT: 26.3 % — ABNORMAL LOW (ref 39.0–52.0)
Hemoglobin: 8.8 g/dL — ABNORMAL LOW (ref 13.0–17.0)
MCH: 31.5 pg (ref 26.0–34.0)
MCHC: 33.5 g/dL (ref 30.0–36.0)
MCV: 94.3 fL (ref 80.0–100.0)
Platelets: 9 10*3/uL — CL (ref 150–400)
RBC: 2.79 MIL/uL — ABNORMAL LOW (ref 4.22–5.81)
RDW: 15.7 % — ABNORMAL HIGH (ref 11.5–15.5)
WBC: 0.2 10*3/uL — CL (ref 4.0–10.5)
nRBC: 0 % (ref 0.0–0.2)

## 2020-07-10 MED ORDER — POTASSIUM CHLORIDE 10 MEQ/100ML IV SOLN
10.0000 meq | INTRAVENOUS | Status: AC
  Administered 2020-07-10 (×4): 10 meq via INTRAVENOUS
  Filled 2020-07-10 (×4): qty 100

## 2020-07-10 MED ORDER — PROSOURCE PLUS PO LIQD
30.0000 mL | Freq: Three times a day (TID) | ORAL | Status: DC
  Administered 2020-07-10 – 2020-07-11 (×2): 30 mL via ORAL
  Filled 2020-07-10 (×3): qty 30

## 2020-07-10 MED ORDER — CHLORHEXIDINE GLUCONATE 0.12 % MT SOLN
15.0000 mL | Freq: Two times a day (BID) | OROMUCOSAL | Status: DC
  Administered 2020-07-10 – 2020-07-15 (×6): 15 mL via OROMUCOSAL
  Filled 2020-07-10 (×4): qty 15

## 2020-07-10 MED ORDER — ORAL CARE MOUTH RINSE
15.0000 mL | Freq: Two times a day (BID) | OROMUCOSAL | Status: DC
  Administered 2020-07-10 – 2020-07-15 (×6): 15 mL via OROMUCOSAL

## 2020-07-10 MED ORDER — METOPROLOL TARTRATE 50 MG PO TABS
50.0000 mg | ORAL_TABLET | Freq: Two times a day (BID) | ORAL | Status: DC
  Administered 2020-07-10 – 2020-07-11 (×2): 50 mg via ORAL
  Filled 2020-07-10 (×2): qty 1

## 2020-07-10 MED ORDER — SODIUM BICARBONATE 8.4 % IV SOLN
INTRAVENOUS | Status: DC
  Filled 2020-07-10: qty 850
  Filled 2020-07-10 (×2): qty 150
  Filled 2020-07-10: qty 850
  Filled 2020-07-10 (×2): qty 150
  Filled 2020-07-10: qty 850

## 2020-07-10 NOTE — Progress Notes (Signed)
[facility] for [facility]ious [facility]  Date of Admission:  07/02/2020     CC: sepsis/pseudomonal bacteremia  Lines: Chronic port (not accessed this admission) Peripheral iv's  Abx: 3/27-c po vanc 3/24-c cefepime 2 gram iv daily renally adjusted  3/25-26 cipro  Other: g-csf Trodelvy, last dose 3/21  ASSESSMENT: Sepsis/Pseudomonas bacteremia Left axillary induration/abscess Neutropenic fever Non-toxogenic cdiff colonization; diarrhea Presence of chronic Port S/p bilateral knee/left hip arthroplasty -- assymptomatic during bacteremic episode Bladder cancer with intraabd lymph node metastasis Chemotherapy associated pancytopenia Afib, on chronic DOAC (being hold for thrombocytopenia) CKD5  70 yo male with bilateral knee and left hip arthroplasty, recurrent metastatic bladder cancer, chronic port, on chemo with Ivette Loyal (last cycle 06/23/2020 -- day 8 on 3/21) admitted 3/23 for generalized weakness found to have sepsis/pseudomonal bacteremia complicated by severe neutropenia    #pseudomonas bacteremia #presence of chronic port #chemo induced neutropenia #metastatic bladder cancer #left chest cellulitis No fever here 3/23 bcx positive but repeat bcx negative He had cellulitic change left chest; u/s without abscess -- appears to be improving with treatment  Source potentially the left chest cellulitis/skin maceration, and so far doesn't appear the port is seeded  -if hemodynamic compromise, or recurrent blood cx positive, port needs to be removed -finish 14 day antibiotics. Oral cipro can be used, but he is not taking po well and having lots of diarrhea. He likely will remain in house so can finish 14 day cefepime here   #diarrhea Gi pcr negative cdiff screen only positive for antigen but toxin negative Unclear if improving on PO vancomycin I suspect chemo related rather than cdiff, however can finish 10 day po vanc  -finish 10 day oral  vanc   #?rash Doesn't appear to be palpable or ecthyma  I query if trodelvy related He is assymptomatic from rash standpoint  - monitor     Principal Problem:   Pancytopenia ([place]) Active Problems:   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 (HCC)   Thrombocytopenia (HCC)   AKI (acute kidney injury) ([place])   Bacteremia due to Pseudomonas   Essential hypertension   Chronic atrial fibrillation (HCC)   Neutropenia (HCC)   CKD (chronic kidney disease) stage 5, GFR less than 15 ml/min (HCC)   Weakness generalized   Anemia of chronic disease   Sepsis ([place])   Scheduled Meds: . (feeding supplement) PROSource Plus  30 mL Oral BID BM  . amiodarone  200 mg Oral Daily  . antiseptic oral rinse  15 mL Mouth Rinse Q2H  . chlorhexidine  15 mL Mouth/Throat QID  . chlorhexidine  15 mL Mouth Rinse BID  . Chlorhexidine Gluconate Cloth  6 each Topical Daily  . DULoxetine  60 mg Oral Daily  . Gerhardt's butt cream   Topical QID  . mouth rinse  15 mL Mouth Rinse q12n4p  . metoprolol tartrate  50 mg Oral BID  . potassium chloride  40 mEq Oral BID  . protein supplement  1 Scoop Oral TID WC  . sodium chloride flush  10-40 mL Intracatheter Q12H  . Tbo-Filgrastim  480 mcg Subcutaneous q1800  . vancomycin  125 mg Oral QID   Continuous Infusions: . ceFEPime (MAXIPIME) IV 2 g (07/10/20 1312)  . potassium chloride 10 mEq (07/10/20 1405)  . sodium bicarbonate (isotonic) 150 mEq in D5W 1000 mL infusion 125 mL/hr at 07/10/20 1106   PRN Meds:.acetaminophen **OR** acetaminophen, ondansetron **OR** ondansetron (ZOFRAN) IV, sodium chloride flush   SUBJECTIVE:  Cell count continues to be very low and anc <500 No other event per nursing staff Not eating much Very fatigue Rash continues to not bother him and not worse; has been present since I first saw him Continues to have diarrhea Continues to not have fever  Review of Systems: ROS All other ros negative  No Known  Allergies  OBJECTIVE: Vitals:   07/09/20 2017 07/09/20 2246 07/10/20 0452 07/10/20 0840  BP: 121/70 114/67 103/74 125/78  Pulse: (!) 116 (!) 109 79 (!) 106  Resp: $Remo'18 19 20 20  'VnGTP$ Temp: 97.9 F (36.6 C) 98.4 F (36.9 C) 98.6 F (37 C)   TempSrc: Oral Oral Axillary   SpO2: 100% 100% 96%   Weight:      Height:       Body mass index is 32.89 kg/m.  Physical Exam General/constitutional: acutely ill appearing, lethargic, minimally conversant, but tracking/following exam HEENT: Normocephalic, PER, Conj Clear, EOMI, oropharynx dry tongue/lips no obvious ulcer Neck supple CV: rrr no mrg Lungs: clear to auscultation, normal respiratory effort Abd: Soft, Nontender Ext: no edema Skin: diffuse macular nonpalpale faint erythema Neuro: nonfocal; generalized weakness 4/5 strength symmetric MSK: no peripheral joint swelling/tenderness/warmth; back spines nontender   Central line presence: port site not assessed  Lab Results Lab Results  Component Value Date   WBC 0.2 (LL) 07/10/2020   HGB 8.8 (L) 07/10/2020   HCT 26.3 (L) 07/10/2020   MCV 94.3 07/10/2020   PLT 9 (LL) 07/10/2020    Lab Results  Component Value Date   CREATININE 6.76 (H) 07/10/2020   BUN 130 (H) 07/10/2020   NA 134 (L) 07/10/2020   K 2.8 (L) 07/10/2020   CL 104 07/10/2020   CO2 14 (L) 07/10/2020    Lab Results  Component Value Date   ALT 25 07/10/2020   AST 19 07/10/2020   ALKPHOS 24 (L) 07/10/2020   BILITOT 2.4 (H) 07/10/2020     Microbiology: Recent Results (from the past 240 hour(s))  Blood culture (routine x 2)     Status: None   Collection Time: 07/03/20  1:28 AM   Specimen: BLOOD  Result Value Ref Range Status   Specimen Description   Final    BLOOD RIGHT ANTECUBITAL Performed at [facility], [address], [place], [place] [address]    Special Requests   Final    BOTTLES DRAWN AEROBIC AND ANAEROBIC Blood Culture results may not be optimal due to an excessive volume of  blood received in culture bottles Performed at [facility] [address] [place], [place] [address]    Culture   Final    NO GROWTH 5 DAYS Performed at [facility], [place] [address], [place], [place] [address]    Report Status 07/08/2020 FINAL  Final  Blood culture (routine x 2)     Status: Abnormal   Collection Time: 07/03/20  1:33 AM   Specimen: BLOOD  Result Value Ref Range Status   Specimen Description   Final    BLOOD RIGHT HAND Performed at [facility] [address], [place], [place] [address]    Special Requests   Final    BOTTLES DRAWN AEROBIC AND ANAEROBIC Blood Culture results may not be optimal due to an excessive volume of blood received in culture bottles Performed at [facility] [address] [place], [place] [address]    Culture  Setup Time   Final    AEROBIC BOTTLE ONLY  GRAM NEGATIVE RODS CRITICAL RESULT CALLED TO, READ BACK BY AND VERIFIED WITHSeleta Rhymes [facility] 07/04/20 0252 JDW Performed at [facility], [place] [address], [place], [place] [address]    Culture PSEUDOMONAS AERUGINOSA (A)  Final   Report Status 07/05/2020 FINAL  Final   Organism ID, Bacteria PSEUDOMONAS AERUGINOSA  Final      Susceptibility   Pseudomonas aeruginosa - MIC*    CEFTAZIDIME 4 SENSITIVE Sensitive     CIPROFLOXACIN <=0.25 SENSITIVE Sensitive     GENTAMICIN <=1 SENSITIVE Sensitive     IMIPENEM 2 SENSITIVE Sensitive     PIP/TAZO 8 SENSITIVE Sensitive     CEFEPIME 2 SENSITIVE Sensitive     * PSEUDOMONAS AERUGINOSA  Resp Panel by RT-PCR (Flu A&B, Covid) Nasopharyngeal Swab     Status: None   Collection Time: 07/03/20  1:33 AM   Specimen: Nasopharyngeal Swab; Nasopharyngeal(NP) swabs in vial transport medium  Result Value Ref Range Status   SARS Coronavirus 2 by RT PCR NEGATIVE NEGATIVE Final    Comment: (NOTE) SARS-CoV-2 target nucleic acids are NOT DETECTED.  The SARS-CoV-2 RNA is generally detectable in  upper respiratory specimens during the acute phase of infection. The lowest concentration of SARS-CoV-2 viral copies this assay can detect is 138 copies/mL. A negative result does not preclude SARS-Cov-2 infection and should not be used as the sole basis for treatment or other patient management decisions. A negative result may occur with  improper specimen collection/handling, submission of specimen other than nasopharyngeal swab, presence of viral mutation(s) within the areas targeted by this assay, and inadequate number of viral copies(<138 copies/mL). A negative result must be combined with clinical observations, patient history, and epidemiological information. The expected result is Negative.  Fact Sheet for Patients:  EntrepreneurPulse.com.au  Fact Sheet for Healthcare Providers:  IncredibleEmployment.be  This test is no t yet approved or cleared by the [place] FDA and  has been authorized for detection and/or diagnosis of SARS-CoV-2 by FDA under an Emergency Use Authorization (EUA). This EUA will remain  in effect (meaning this test can be used) for the duration of the COVID-19 declaration under Section 564(b)(1) of the Act, 21 U.S.C.section 360bbb-3(b)(1), unless the authorization is terminated  or revoked sooner.       Influenza A by PCR NEGATIVE NEGATIVE Final   Influenza B by PCR NEGATIVE NEGATIVE Final    Comment: (NOTE) The Xpert Xpress SARS-CoV-2/FLU/RSV plus assay is intended as an aid in the diagnosis of influenza from Nasopharyngeal swab specimens and should not be used as a sole basis for treatment. Nasal washings and aspirates are unacceptable for Xpert Xpress SARS-CoV-2/FLU/RSV testing.  Fact Sheet for Patients: EntrepreneurPulse.com.au  Fact Sheet for Healthcare Providers: IncredibleEmployment.be  This test is not yet approved or cleared by the [place] FDA and has been  authorized for detection and/or diagnosis of SARS-CoV-2 by FDA under an Emergency Use Authorization (EUA). This EUA will remain in effect (meaning this test can be used) for the duration of the COVID-19 declaration under Section 564(b)(1) of the Act, 21 U.S.C. section 360bbb-3(b)(1), unless the authorization is terminated or revoked.  Performed at [facility], [place] [address], [place], [place] [address]   Blood Culture ID Panel (Reflexed)     Status: Abnormal   Collection Time: 07/03/20  1:33 AM  Result Value Ref Range Status   Enterococcus faecalis NOT DETECTED NOT DETECTED Final   Enterococcus Faecium NOT DETECTED NOT DETECTED Final   Listeria monocytogenes NOT  DETECTED NOT DETECTED Final   Staphylococcus species NOT DETECTED NOT DETECTED Final   Staphylococcus aureus (BCID) NOT DETECTED NOT DETECTED Final   Staphylococcus epidermidis NOT DETECTED NOT DETECTED Final   Staphylococcus lugdunensis NOT DETECTED NOT DETECTED Final   Streptococcus species NOT DETECTED NOT DETECTED Final   Streptococcus agalactiae NOT DETECTED NOT DETECTED Final   Streptococcus pneumoniae NOT DETECTED NOT DETECTED Final   Streptococcus pyogenes NOT DETECTED NOT DETECTED Final   A.calcoaceticus-baumannii NOT DETECTED NOT DETECTED Final   Bacteroides fragilis NOT DETECTED NOT DETECTED Final   Enterobacterales NOT DETECTED NOT DETECTED Final   Enterobacter cloacae complex NOT DETECTED NOT DETECTED Final   Escherichia coli NOT DETECTED NOT DETECTED Final   Klebsiella aerogenes NOT DETECTED NOT DETECTED Final   Klebsiella oxytoca NOT DETECTED NOT DETECTED Final   Klebsiella pneumoniae NOT DETECTED NOT DETECTED Final   Proteus species NOT DETECTED NOT DETECTED Final   Salmonella species NOT DETECTED NOT DETECTED Final   Serratia marcescens NOT DETECTED NOT DETECTED Final   Haemophilus influenzae NOT DETECTED NOT DETECTED Final   Neisseria meningitidis NOT DETECTED NOT DETECTED Final    Pseudomonas aeruginosa DETECTED (A) NOT DETECTED Final    Comment: CRITICAL RESULT CALLED TO, READ BACK BY AND VERIFIED WITH: E JACKSON PHARMD 07/04/20 0252 JDW    Stenotrophomonas maltophilia NOT DETECTED NOT DETECTED Final   Candida albicans NOT DETECTED NOT DETECTED Final   Candida auris NOT DETECTED NOT DETECTED Final   Candida glabrata NOT DETECTED NOT DETECTED Final   Candida krusei NOT DETECTED NOT DETECTED Final   Candida parapsilosis NOT DETECTED NOT DETECTED Final   Candida tropicalis NOT DETECTED NOT DETECTED Final   Cryptococcus neoformans/gattii NOT DETECTED NOT DETECTED Final   CTX-M ESBL NOT DETECTED NOT DETECTED Final   Carbapenem resistance IMP NOT DETECTED NOT DETECTED Final   Carbapenem resistance KPC NOT DETECTED NOT DETECTED Final   Carbapenem resistance NDM NOT DETECTED NOT DETECTED Final   Carbapenem resistance VIM NOT DETECTED NOT DETECTED Final    Comment: Performed at [facility], [address] [place], [place] [address]  C Difficile Quick Screen w PCR reflex     Status: Abnormal   Collection Time: 07/03/20  7:01 PM   Specimen: STOOL  Result Value Ref Range Status   C Diff antigen POSITIVE (A) NEGATIVE Final   C Diff toxin NEGATIVE NEGATIVE Final   C Diff interpretation Results are indeterminate. See PCR results.  Final    Comment: Performed at [facility], [place] [address], [place], [place] [address]  Gastrointestinal Panel by PCR , Stool     Status: None   Collection Time: 07/03/20  7:01 PM   Specimen: STOOL  Result Value Ref Range Status   Campylobacter species NOT DETECTED NOT DETECTED Final   Plesimonas shigelloides NOT DETECTED NOT DETECTED Final   Salmonella species NOT DETECTED NOT DETECTED Final   Yersinia enterocolitica NOT DETECTED NOT DETECTED Final   Vibrio species NOT DETECTED NOT DETECTED Final   Vibrio cholerae NOT DETECTED NOT DETECTED Final   Enteroaggregative E coli (EAEC) NOT DETECTED NOT DETECTED Final    Enteropathogenic E coli (EPEC) NOT DETECTED NOT DETECTED Final   Enterotoxigenic E coli (ETEC) NOT DETECTED NOT DETECTED Final   Shiga like toxin producing E coli (STEC) NOT DETECTED NOT DETECTED Final   Shigella/Enteroinvasive E coli (EIEC) NOT DETECTED NOT DETECTED Final   Cryptosporidium NOT DETECTED NOT DETECTED Final   Cyclospora cayetanensis NOT DETECTED NOT  DETECTED Final   Entamoeba histolytica NOT DETECTED NOT DETECTED Final   Giardia lamblia NOT DETECTED NOT DETECTED Final   Adenovirus F40/41 NOT DETECTED NOT DETECTED Final   Astrovirus NOT DETECTED NOT DETECTED Final   Norovirus GI/GII NOT DETECTED NOT DETECTED Final   Rotavirus A NOT DETECTED NOT DETECTED Final   Sapovirus (I, II, IV, and V) NOT DETECTED NOT DETECTED Final    Comment: Performed at [facility], [address] [place], [place] [address]  C. Diff by PCR, Reflexed     Status: None   Collection Time: 07/03/20  7:01 PM  Result Value Ref Range Status   Toxigenic C. Difficile by PCR NEGATIVE NEGATIVE Final    Comment: Patient is colonized with non toxigenic C. difficile. May not need treatment unless significant symptoms are present. Performed at [facility], [place] [address] [place], [place] [address]   Culture, blood (routine x 2)     Status: None   Collection Time: 07/04/20  9:43 AM   Specimen: BLOOD LEFT HAND  Result Value Ref Range Status   Specimen Description   Final    BLOOD LEFT HAND Performed at [facility] [address], [place], [place] [address]    Special Requests   Final    BOTTLES DRAWN AEROBIC ONLY Blood Culture adequate volume Performed at [facility] [address], [place], [place] [address]    Culture   Final    NO GROWTH 5 DAYS Performed at [facility], [place] [address] [place], [place] [address]    Report Status 07/09/2020 FINAL  Final  Culture, blood (routine x 2)     Status: None   Collection Time: 07/04/20  9:43 AM    Specimen: BLOOD LEFT HAND  Result Value Ref Range Status   Specimen Description   Final    BLOOD LEFT HAND Performed at [facility] [address], [place], [place] [address]    Special Requests   Final    BOTTLES DRAWN AEROBIC ONLY Blood Culture adequate volume Performed at [facility] [address], [place], [place] [address]    Culture   Final    NO GROWTH 5 DAYS Performed at [facility], [place] [address] [place], [place] [address]    Report Status 07/09/2020 FINAL  Final    Serology:  Imaging: If present, new imagings (plain films, ct scans, and mri) have been personally visualized and interpreted; radiology reports have been reviewed. Decision making incorporated into the Impression / Recommendations.  3/24 cxr The heart size and mediastinal contours are mildly enlarged. A right-sided MediPort catheter seen with the tip at the superior cavoatrial junction. Both lungs are clear. The visualized skeletal structures are unremarkable.  3/25 abd xray The bowel gas pattern is normal. There is no evidence of free air. No radio-opaque calculi or other significant radiographic abnormality is seen.  3/25 renal u/s 1. No obstructive uropathy. 2. Bilateral renal parenchymal thinning and increased echogenicity consistent with chronic medical renal disease. 3. Echogenic bladder wall thickening in the right bladder, corresponding to that seen on prior PET CT, at site of hypermetabolism prior PET.  3/28 u/s soft tissue chest No evidence abscess  Jabier Mutton, [facility] [place] [phone] pager    07/10/2020, 2:18 PM

## 2020-07-10 NOTE — Progress Notes (Signed)
We are still dealing with the issues with profound neutropenia.  His white cell count is now 0.2.  Hopefully, we will start to see a trend upward.  He just is having a very slow recovery.  He is incredibly weak.  His kidneys are clearly not doing well.  I do believe that he might need dialysis.  He clearly is not hyperkalemic.  In fact, his potassium is on the low side.  He is taking some oral potassium but I really do not know how much this is really helping and how much he is able to tolerate.  Another problem that we have is his thrombocytopenia.  Despite platelets, he just is not bouncing up.  He got platelets yesterday.  His platelet count today is 9000.  There is no bleeding.  We are going to hold on a transfusion today.  His hemoglobin is 8.8.  His albumin is positive in his legs.  .  Bilirubin is 2.4.  His creatinine is 6.76.  His BUN is still pending.  Yesterday the BUN was 111.  He is having diarrhea.  He is on vancomycin for C. difficile.  This clearly is not helping the situation.  His potassium is 2.8 today.  I am sure that the nephrology service will manage this.  He has had no fever.  He is on Maxipime for the Pseudomonas.  Have to believe that this is not a problem.  I probably would recheck a blood culture on him.  I really do not see why the Port-A-Cath cannot be accessed.  I do not know if he needs any kind of parenteral nutrition.  His atrial fibrillation is not flared up on him.  I really doubt that he is going to ever receive any further chemotherapy.  This last cycle just really took a lot out of him.  Even with a dose that is only 50% of regular dose, we still have this profound pancytopenia issue that he has shown a very difficult time to recover from.  His vital signs show temperature of 98.4.  Pulse 109.  Blood pressure 114/67.  Oral exam shows a dry oral cavity.  He has some small hematomas.  His lungs do sound pretty decent.  He has good air movement  bilaterally.  Cardiac exam tachycardic and irregular consistent with atrial fibrillation.  His abdomen is soft.  Bowel sounds are decreased.  There is no guarding or rebound tenderness.    [name] Rose is slowly, slowly, slowly regaining his white cells.  He is so profoundly neutropenic however.  He is still on Neupogen.  Hopefully, the white cells will start to pick up.  I will hold off on platelets today.  He is not bleeding.  The platelets last night.  I guess the issue is whether or not he is going to need dialysis.  He is not hyperkalemic.  I think he is still making some urine.  I know that nephrology is following very closely.  If he needs to get IV potassium, the Port-A-Cath certainly can be accessed.  I know the staff on [address] are doing a great job trying to help him.  There are a lot of issues going on right now.  I know he is trying his best.  His body is just so weak from the pancytopenia and his renal failure and the bacteremia and the malignancy and the atrial fibrillation.  [name] Haw, MD  [name] Rose 41:10

## 2020-07-10 NOTE — Progress Notes (Signed)
Pharmacy Antibiotic Note  [name] Rose is a 70 y.o. male with a h/o bladder cancer who received chemo 3/21 admitted on 07/02/2020 with weakness. Patient is pancytopenic. Now found to have pseudomonas in 1/4 blood cultures. Patient placed on double coverage with Cefepime and Ciprofloxacin per Oncology. ID has narrowed to Cefepime alone. Patient also placed on PO vanc 3/27 for C.difficile diarrhea.   Day 7 of 14 Cefepime per ID plan Day 4 of 10 PO vanc per ID Afebrile WBC 0.2 Scr 6.76, renal following, baseline SCr 5.8-6.2  Plan:  Continue Cefepime 2g IV q24h  PO Vancomycin per MD  Monitor renal function, cultures, clinical course  Height: $Remove'6\' 2"'TVKpTnm$  (188 cm) Weight: 116.2 kg (256 lb 2.8 oz) IBW/kg (Calculated) : 82.2  Temp (24hrs), Avg:98.5 F (36.9 C), Min:97.8 F (36.6 C), Max:99.9 F (37.7 C)  Recent Labs  Lab 07/06/20 0528 07/07/20 0506 07/07/20 1325 07/08/20 0519 07/09/20 0836 07/09/20 0840 07/10/20 0525  WBC <0.1* <0.1*  --  0.1* 0.1*  --  0.2*  CREATININE 6.71* 6.20* 6.30* 6.07*  --  6.32* 6.76*    Estimated Creatinine Clearance: 14.2 mL/min (A) (by C-G formula based on SCr of 6.76 mg/dL (H)).    No Known Allergies  Antimicrobials this admission: 3/24 Cefepime >>  3/25 Ciprofloxacin > 3/26 3/27 PO Vancomycin >>   Microbiology results: 3/24 BCx: 1/4 Pseudomonas aeruginosa (pan-sensitive) 3/24 Resp panel: COVID negative, Influenza A/B negative  3/24 GI panel: negative 3/24 C diff quick screen: antigen (+) toxin (-), neg by PCR 3/25 BCx: NGTD  Thank you for allowing pharmacy to be a part of this patient's care.   Adrian Saran, PharmD, BCPS 07/10/2020 10:15 AM

## 2020-07-10 NOTE — Progress Notes (Signed)
PROGRESS NOTE  CHIN WACHTER JTT:[id number] DOB: 01-Oct-1950 DOA: 07/02/2020 PCP: Shirline Frees, MD  Brief History   70 year old man PMH including bladder cancer, CKD stage V, atrial fibrillation presented with generalized weakness.  Admitted for pancytopenia.  Found to have Pseudomonas bacteremia.  Nephrology consulted for worsening renal function.  Seen by oncology for pancytopenia.  Has required multiple blood products and has received Neupogen.  Found to have nontoxigenic C. difficile and started on treatment as per infectious disease.  Infectious disease has recommended removing Port-A-Cath however neither surgery nor oncology feel this is prudent at this point.  A & P  Pancytopenia secondary to antineoplastic therapy.   Anemia of chronic disease. Patient status post chemotherapy earlier this month.   --Continue transfusion blood products as per oncology including platelets and PRBCs. --Continue filgrastim as per oncology --No significant change in platelet count, hemoglobin.  WBC improved today although still quite low.  Sepsis secondary to Pseudomonas bacteremia, left axilla cellulitis present on admission --Hemodynamics stable.  Will continue antibiotic as per infectious disease.    Atrial fibrillation with rapid ventricular response, chronic atrial fibrillation  --Remains asymptomatic, heart rate improved but still not adequately controlled.  Continue amiodarone.  Increase metoprolol.  AKI superimposed on CKD stage V with metabolic acidosis, hyperphosphatemia --Slowly worsening now with elevated anion gap.  Management per nephrology.  Acute metabolic encephalopathy likely secondary to uremia. --Management by nephrology  Nontoxigenic C. difficile diarrhea present on admission --ID recommended oral vancomycin for 10 days, will complete.  Suspect diarrhea is from chemotherapy or neutropenia.  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  Obesity unspecified Body mass index is 32.89 kg/m.  Disposition Plan:  Discussion: Modest improvement in leukopenia.  No significant treatment delay care despite transfusion.  Prognosis remains guarded, discussed with wife at bedside.  Will discuss with oncology pursuing palliative care consultation and reconsideration of CODE STATUS.  Status is: Inpatient  Remains inpatient appropriate because:Ongoing diagnostic testing needed not appropriate for outpatient work up, IV treatments appropriate due to intensity of illness or inability to take PO and Inpatient level of care appropriate due to severity of illness   Dispo: The patient is from: Home              Anticipated d/c is to: Home              Patient currently is not medically stable to d/c.   Difficult to place patient No  DVT prophylaxis: SCDs Start: 07/03/20 1451   Code Status: Full Code Level of care: Telemetry Family Communication: wife at bedside 3/31  [name], MD  [facility] Direct contact: see [email or web address] (further directions at bottom of note if needed) 7PM-7AM contact night coverage as at bottom of note 07/10/2020, 5:13 PM  LOS: 7 days   Significant Hospital Events   . Admit 3/23    Consults:  . Oncology  . Nephrology . ID . General surgery    Procedures:  .   Significant Diagnostic Tests:  Marland Kitchen    Micro Data:  .    Antimicrobials:  .   Interval History/Subjective  CC: f/u pancytopenia  Tired, slept poorly last night.  Wife reports he is confused.  Very poor appetite.  Objective   Vitals:  Vitals:   07/10/20 0452 07/10/20 0840  BP: 103/74 125/78  Pulse: 79 (!) 106  Resp: 20 20  Temp: 98.6 F (37  C)   SpO2: 96%     Exam:  Constitutional:   . Appears calm, ill, keeps eyes closed, nontoxic ENMT:  . grossly normal hearing  Respiratory:  . CTA bilaterally, no w/r/r.   . Respiratory effort normal.  Cardiovascular:  . RRR, no m/r/g . No LE extremity edema   Abdomen:  . soft Musculoskeletal:  . RUE, LUE, RLE, LLE   . Moves all extremities . Digits hands/feet appear unremarkable Skin:  . No petechiae noted Psychiatric:  . Mental status o Mood difficult to gauge, affect odd . confused  I have personally reviewed the following:   Today's Data  . Potassium 2.8 . Creatinine worse 6.76, AG now elevated at 16 . Hemoglobin 8.8  . WBC up to 0.2 . Platelets without significant change, 9  Scheduled Meds: . (feeding supplement) PROSource Plus  30 mL Oral TID BM  . amiodarone  200 mg Oral Daily  . antiseptic oral rinse  15 mL Mouth Rinse Q2H  . chlorhexidine  15 mL Mouth/Throat QID  . chlorhexidine  15 mL Mouth Rinse BID  . Chlorhexidine Gluconate Cloth  6 each Topical Daily  . DULoxetine  60 mg Oral Daily  . Gerhardt's butt cream   Topical QID  . mouth rinse  15 mL Mouth Rinse q12n4p  . metoprolol tartrate  50 mg Oral BID  . potassium chloride  40 mEq Oral BID  . protein supplement  1 Scoop Oral TID WC  . sodium chloride flush  10-40 mL Intracatheter Q12H  . Tbo-Filgrastim  480 mcg Subcutaneous q1800  . vancomycin  125 mg Oral QID   Continuous Infusions: . ceFEPime (MAXIPIME) IV 2 g (07/10/20 1312)  . sodium bicarbonate (isotonic) 150 mEq in D5W 1000 mL infusion 125 mL/hr at 07/10/20 1106    Principal Problem:   Pancytopenia ([place]) Active Problems:   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 (HCC)   Thrombocytopenia (HCC)   AKI (acute kidney injury) ([place])   Bacteremia due to Pseudomonas   Essential hypertension   Chronic atrial fibrillation (HCC)   Neutropenia (HCC)   CKD (chronic kidney disease) stage 5, GFR less than 15 ml/min (HCC)   Weakness generalized   Anemia of chronic disease   Sepsis ([place])   LOS: 7 days   How to contact the [facility] Attending or Consulting provider 7A - 7P or covering provider during after  hours 7P -7A, for this patient?  1. Check the care team in [facility] and look for a) attending/consulting [facility] provider listed and b) the [facility] team listed 2. Log into [email or web address] and use Friend's universal password to access. If you do not have the password, please contact the hospital operator. 3. Locate the [facility] provider you are looking for under [facility] and page to a number that you can be directly reached. 4. If you still have difficulty reaching the provider, please page the [facility] (Director on Call) for the Hospitalists listed on amion for assistance.

## 2020-07-10 NOTE — Progress Notes (Signed)
Nutrition Follow-up  DOCUMENTATION CODES:   Obesity unspecified  INTERVENTION:   -Prosource Plus PO BID, each provides 100 kcals and 15g protein -will increase to TID  -Beneprotein TID with meals, provides 25 kcals and 6g protein  NUTRITION DIAGNOSIS:   Increased nutrient needs related to cancer and cancer related treatments as evidenced by estimated needs.  Ongoing.  GOAL:   Patient will meet greater than or equal to 90% of their needs  Progressing.  MONITOR:   PO intake,Supplement acceptance,Labs,Weight trends,I & O's  ASSESSMENT:   70 year old obese Caucasian male with a past medical history significant for but not limited to bladder cancer, chronic atrial fibrillation, CKD stage V, history of chronic anemia secondary to chronic kidney disease, hypertension as well as other comorbidities who presented via EMS for generalized weakness.  Patient not eating much of meals at this time. Pt was taking supplements but today he has been declining Beneprotein. Will continue and increase Prosource to TID.   Per nephrology note, pt does not require HD at this time.  Admission weight: 256 lbs. Current weight: 256 lbs.  Medications: KLOR-CON  Labs reviewed:  Low Na, K  Diet Order:   Diet Order            Diet Heart Room service appropriate? Yes; Fluid consistency: Thin  Diet effective now                 EDUCATION NEEDS:   Education needs have been addressed  Skin:  Skin Assessment: Reviewed RN Assessment  Last BM:  3/31  Height:   Ht Readings from Last 1 Encounters:  07/03/20 $RemoveB'6\' 2"'wWmHmiSF$  (1.88 m)    Weight:   Wt Readings from Last 1 Encounters:  07/09/20 116.2 kg    BMI:  Body mass index is 32.89 kg/m.  Estimated Nutritional Needs:   Kcal:  2300-2500  Protein:  120-130g  Fluid:  2.3L/day  Clayton Bibles, MS, RD, LDN Inpatient Clinical Dietitian Contact information available via Amion

## 2020-07-10 NOTE — Care Management Important Message (Signed)
Important Message  Patient Details IM Letter given to the Patient. Name: [name] Rose MRN: [id number] Date of Birth: 1950-05-13   Medicare Important Message Given:  Yes     Kerin Salen 07/10/2020, 10:01 AM

## 2020-07-10 NOTE — Progress Notes (Signed)
[facility] ROUNDING NOTE   Subjective:   Brief history.   70 year old gentleman history of bladder cancer stage V chronic kidney disease atrial fibrillation admitted with neutropenic fever pancytopenia and Pseudomonas bacteremia.  Acute on chronic kidney disease changes baseline creatinine about 6 mg/dL.  Blood pressure 124/78 pulse 100 temperature 98.6 O2 sats 96% 2 L nasal cannula  Urine output 600 cc 07/09/2020  Sodium 134 potassium 2.8 chloride 104 CO2 14 BUN 130 creatinine 6.76 glucose 129 albumin 1.8    Objective:  Vital signs in last 24 hours:  Temp:  [97.8 F (36.6 C)-99.9 F (37.7 C)] 98.6 F (37 C) (03/31 0452) Pulse Rate:  [79-136] 106 (03/31 0840) Resp:  [18-20] 20 (03/31 0840) BP: (96-125)/(59-78) 125/78 (03/31 0840) SpO2:  [96 %-100 %] 96 % (03/31 0452)  Weight change:  Filed Weights   07/06/20 0555 07/08/20 0500 07/09/20 0520  Weight: 114.1 kg 115.8 kg 116.2 kg    Intake/Output: I/O last 3 completed shifts: In: 966.8 [Blood:766.8; IV Piggyback:200] Out: 2150 [Urine:2150]   Intake/Output this shift:  No intake/output data recorded.  GEN: Chronically ill-appearing, lying in bed, nad ENT: no nasal discharge, mmm EYES: no scleral icterus, eomi CV: Tachycardic PULM: no iwob, bilateral chest rise ABD: NABS, non-distended SKIN: no rashes or jaundice EXT: no edema, warm and well perfused   Basic Metabolic Panel: Recent Labs  Lab 07/05/20 1054 07/06/20 0528 07/07/20 0506 07/07/20 1325 07/08/20 0519 07/09/20 0840 07/10/20 0525  NA 129*  127* 130* 132* 133* 132* 133* 134*  K 3.2*  3.2* 3.9 2.8* 3.0* 3.1* 2.9* 2.8*  CL 104  103 106 102 103 103 103 104  CO2 13*  14* 15* 17* 18* 16* 17* 14*  GLUCOSE 158*  162* 135* 127* 146* 156* 125* 129*  BUN 89*  86* 92* 91* 95* 97* 111* 130*  CREATININE 6.77*  5.91* 6.71* 6.20* 6.30* 6.07* 6.32* 6.76*  CALCIUM 7.7*  7.7* 8.1* 7.6* 7.7* 7.7* 8.0* 7.7*  MG 1.7  1.7 1.9 1.7  --  1.9 1.9  --    PHOS 4.2  4.1 4.3 5.1*  --  4.6 5.5*  --     Liver Function Tests: Recent Labs  Lab 07/06/20 0528 07/07/20 0506 07/08/20 0519 07/09/20 0840 07/10/20 0525  AST 14* 15 12* 14* 19  ALT $Re'25 26 22 22 25  'OWj$ ALKPHOS 29* 28* 27* 23* 24*  BILITOT 1.0 1.0 1.6* 1.8* 2.4*  PROT 5.0* 4.8* 4.8* 4.7* 4.5*  ALBUMIN 2.2* 2.1* 2.1* 2.0* 1.8*   No results for input(s): LIPASE, AMYLASE in the last 168 hours. No results for input(s): AMMONIA in the last 168 hours.  CBC: Recent Labs  Lab 07/06/20 0528 07/07/20 0506 07/08/20 0519 07/09/20 0836 07/10/20 0525  WBC <0.1* <0.1* 0.1* 0.1* 0.2*  NEUTROABS Not Measured  --   --   --   --   HGB 7.6* 7.3* 9.3* 9.4* 8.8*  HCT 22.7* 21.6* 27.4* 27.8* 26.3*  MCV 97.0 94.7 93.2 93.6 94.3  PLT 5* 13* 6* 7* 9*    Cardiac Enzymes: No results for input(s): CKTOTAL, CKMB, CKMBINDEX, TROPONINI in the last 168 hours.  BNP: Invalid input(s): POCBNP  CBG: No results for input(s): GLUCAP in the last 168 hours.  Microbiology: Results for orders placed or performed during the hospital encounter of 07/02/20  Blood culture (routine x 2)     Status: None   Collection Time: 07/03/20  1:28 AM   Specimen: BLOOD  Result Value Ref  Range Status   Specimen Description   Final    BLOOD RIGHT ANTECUBITAL Performed at [facility] [address], [place], [place] [address]    Special Requests   Final    BOTTLES DRAWN AEROBIC AND ANAEROBIC Blood Culture results may not be optimal due to an excessive volume of blood received in culture bottles Performed at [facility] [address] [place], [place] [address]    Culture   Final    NO GROWTH 5 DAYS Performed at [facility], [place] [address], [place], [place] [address]    Report Status 07/08/2020 FINAL  Final  Blood culture (routine x 2)     Status: Abnormal   Collection Time: 07/03/20  1:33 AM   Specimen: BLOOD  Result Value Ref Range Status   Specimen Description    Final    BLOOD RIGHT HAND Performed at [facility] [address] [place], [place] [address]    Special Requests   Final    BOTTLES DRAWN AEROBIC AND ANAEROBIC Blood Culture results may not be optimal due to an excessive volume of blood received in culture bottles Performed at [facility] [address] [place], [place] [address]    Culture  Setup Time   Final    AEROBIC BOTTLE ONLY GRAM NEGATIVE RODS CRITICAL RESULT CALLED TO, READ BACK BY AND VERIFIED WITHSeleta Rhymes [facility] 07/04/20 0252 JDW Performed at [facility], [place] [address] [place], [place] [address]    Culture PSEUDOMONAS AERUGINOSA (A)  Final   Report Status 07/05/2020 FINAL  Final   Organism ID, Bacteria PSEUDOMONAS AERUGINOSA  Final      Susceptibility   Pseudomonas aeruginosa - MIC*    CEFTAZIDIME 4 SENSITIVE Sensitive     CIPROFLOXACIN <=0.25 SENSITIVE Sensitive     GENTAMICIN <=1 SENSITIVE Sensitive     IMIPENEM 2 SENSITIVE Sensitive     PIP/TAZO 8 SENSITIVE Sensitive     CEFEPIME 2 SENSITIVE Sensitive     * PSEUDOMONAS AERUGINOSA  Resp Panel by RT-PCR (Flu A&B, Covid) Nasopharyngeal Swab     Status: None   Collection Time: 07/03/20  1:33 AM   Specimen: Nasopharyngeal Swab; Nasopharyngeal(NP) swabs in vial transport medium  Result Value Ref Range Status   SARS Coronavirus 2 by RT PCR NEGATIVE NEGATIVE Final    Comment: (NOTE) SARS-CoV-2 target nucleic acids are NOT DETECTED.  The SARS-CoV-2 RNA is generally detectable in upper respiratory specimens during the acute phase of infection. The lowest concentration of SARS-CoV-2 viral copies this assay can detect is 138 copies/mL. A negative result does not preclude SARS-Cov-2 infection and should not be used as the sole basis for treatment or other patient management decisions. A negative result may occur with  improper specimen collection/handling, submission of specimen other than nasopharyngeal swab, presence  of viral mutation(s) within the areas targeted by this assay, and inadequate number of viral copies(<138 copies/mL). A negative result must be combined with clinical observations, patient history, and epidemiological information. The expected result is Negative.  Fact Sheet for Patients:  EntrepreneurPulse.com.au  Fact Sheet for Healthcare Providers:  IncredibleEmployment.be  This test is no t yet approved or cleared by the [place] FDA and  has been authorized for detection and/or diagnosis of SARS-CoV-2 by FDA under an Emergency Use Authorization (EUA). This EUA will remain  in effect (meaning this test can be used) for the duration of the COVID-19 declaration under Section  564(b)(1) of the Act, 21 U.S.C.section 360bbb-3(b)(1), unless the authorization is terminated  or revoked sooner.       Influenza A by PCR NEGATIVE NEGATIVE Final   Influenza B by PCR NEGATIVE NEGATIVE Final    Comment: (NOTE) The Xpert Xpress SARS-CoV-2/FLU/RSV plus assay is intended as an aid in the diagnosis of influenza from Nasopharyngeal swab specimens and should not be used as a sole basis for treatment. Nasal washings and aspirates are unacceptable for Xpert Xpress SARS-CoV-2/FLU/RSV testing.  Fact Sheet for Patients: EntrepreneurPulse.com.au  Fact Sheet for Healthcare Providers: IncredibleEmployment.be  This test is not yet approved or cleared by the [place] FDA and has been authorized for detection and/or diagnosis of SARS-CoV-2 by FDA under an Emergency Use Authorization (EUA). This EUA will remain in effect (meaning this test can be used) for the duration of the COVID-19 declaration under Section 564(b)(1) of the Act, 21 U.S.C. section 360bbb-3(b)(1), unless the authorization is terminated or revoked.  Performed at [facility], [place] [address] [place], [place] [address]   Blood Culture  ID Panel (Reflexed)     Status: Abnormal   Collection Time: 07/03/20  1:33 AM  Result Value Ref Range Status   Enterococcus faecalis NOT DETECTED NOT DETECTED Final   Enterococcus Faecium NOT DETECTED NOT DETECTED Final   Listeria monocytogenes NOT DETECTED NOT DETECTED Final   Staphylococcus species NOT DETECTED NOT DETECTED Final   Staphylococcus aureus (BCID) NOT DETECTED NOT DETECTED Final   Staphylococcus epidermidis NOT DETECTED NOT DETECTED Final   Staphylococcus lugdunensis NOT DETECTED NOT DETECTED Final   Streptococcus species NOT DETECTED NOT DETECTED Final   Streptococcus agalactiae NOT DETECTED NOT DETECTED Final   Streptococcus pneumoniae NOT DETECTED NOT DETECTED Final   Streptococcus pyogenes NOT DETECTED NOT DETECTED Final   A.calcoaceticus-baumannii NOT DETECTED NOT DETECTED Final   Bacteroides fragilis NOT DETECTED NOT DETECTED Final   Enterobacterales NOT DETECTED NOT DETECTED Final   Enterobacter cloacae complex NOT DETECTED NOT DETECTED Final   Escherichia coli NOT DETECTED NOT DETECTED Final   Klebsiella aerogenes NOT DETECTED NOT DETECTED Final   Klebsiella oxytoca NOT DETECTED NOT DETECTED Final   Klebsiella pneumoniae NOT DETECTED NOT DETECTED Final   Proteus species NOT DETECTED NOT DETECTED Final   Salmonella species NOT DETECTED NOT DETECTED Final   Serratia marcescens NOT DETECTED NOT DETECTED Final   Haemophilus influenzae NOT DETECTED NOT DETECTED Final   Neisseria meningitidis NOT DETECTED NOT DETECTED Final   Pseudomonas aeruginosa DETECTED (A) NOT DETECTED Final    Comment: CRITICAL RESULT CALLED TO, READ BACK BY AND VERIFIED WITH: E JACKSON PHARMD 07/04/20 0252 JDW    Stenotrophomonas maltophilia NOT DETECTED NOT DETECTED Final   Candida albicans NOT DETECTED NOT DETECTED Final   Candida auris NOT DETECTED NOT DETECTED Final   Candida glabrata NOT DETECTED NOT DETECTED Final   Candida krusei NOT DETECTED NOT DETECTED Final   Candida parapsilosis  NOT DETECTED NOT DETECTED Final   Candida tropicalis NOT DETECTED NOT DETECTED Final   Cryptococcus neoformans/gattii NOT DETECTED NOT DETECTED Final   CTX-M ESBL NOT DETECTED NOT DETECTED Final   Carbapenem resistance IMP NOT DETECTED NOT DETECTED Final   Carbapenem resistance KPC NOT DETECTED NOT DETECTED Final   Carbapenem resistance NDM NOT DETECTED NOT DETECTED Final   Carbapenem resistance VIM NOT DETECTED NOT DETECTED Final    Comment: Performed at [facility], [address] [place], [place] [address]  C Difficile Quick Screen w PCR  reflex     Status: Abnormal   Collection Time: 07/03/20  7:01 PM   Specimen: STOOL  Result Value Ref Range Status   C Diff antigen POSITIVE (A) NEGATIVE Final   C Diff toxin NEGATIVE NEGATIVE Final   C Diff interpretation Results are indeterminate. See PCR results.  Final    Comment: Performed at [facility], [place] [address] [place], [place] [address]  Gastrointestinal Panel by PCR , Stool     Status: None   Collection Time: 07/03/20  7:01 PM   Specimen: STOOL  Result Value Ref Range Status   Campylobacter species NOT DETECTED NOT DETECTED Final   Plesimonas shigelloides NOT DETECTED NOT DETECTED Final   Salmonella species NOT DETECTED NOT DETECTED Final   Yersinia enterocolitica NOT DETECTED NOT DETECTED Final   Vibrio species NOT DETECTED NOT DETECTED Final   Vibrio cholerae NOT DETECTED NOT DETECTED Final   Enteroaggregative E coli (EAEC) NOT DETECTED NOT DETECTED Final   Enteropathogenic E coli (EPEC) NOT DETECTED NOT DETECTED Final   Enterotoxigenic E coli (ETEC) NOT DETECTED NOT DETECTED Final   Shiga like toxin producing E coli (STEC) NOT DETECTED NOT DETECTED Final   Shigella/Enteroinvasive E coli (EIEC) NOT DETECTED NOT DETECTED Final   Cryptosporidium NOT DETECTED NOT DETECTED Final   Cyclospora cayetanensis NOT DETECTED NOT DETECTED Final   Entamoeba histolytica NOT DETECTED NOT DETECTED Final   Giardia  lamblia NOT DETECTED NOT DETECTED Final   Adenovirus F40/41 NOT DETECTED NOT DETECTED Final   Astrovirus NOT DETECTED NOT DETECTED Final   Norovirus GI/GII NOT DETECTED NOT DETECTED Final   Rotavirus A NOT DETECTED NOT DETECTED Final   Sapovirus (I, II, IV, and V) NOT DETECTED NOT DETECTED Final    Comment: Performed at [facility], [place] [place], [place] [address]  C. Diff by PCR, Reflexed     Status: None   Collection Time: 07/03/20  7:01 PM  Result Value Ref Range Status   Toxigenic C. Difficile by PCR NEGATIVE NEGATIVE Final    Comment: Patient is colonized with non toxigenic C. difficile. May not need treatment unless significant symptoms are present. Performed at [facility], [place] [address], [place], [place] [address]   Culture, blood (routine x 2)     Status: None   Collection Time: 07/04/20  9:43 AM   Specimen: BLOOD LEFT HAND  Result Value Ref Range Status   Specimen Description   Final    BLOOD LEFT HAND Performed at [facility] [address] [place], [place] [address]    Special Requests   Final    BOTTLES DRAWN AEROBIC ONLY Blood Culture adequate volume Performed at [facility] [address], [place], [place] [address]    Culture   Final    NO GROWTH 5 DAYS Performed at [facility], [place] [address] [place], [place] [address]    Report Status 07/09/2020 FINAL  Final  Culture, blood (routine x 2)     Status: None   Collection Time: 07/04/20  9:43 AM   Specimen: BLOOD LEFT HAND  Result Value Ref Range Status   Specimen Description   Final    BLOOD LEFT HAND Performed at [facility] [address], [place], [place] [address]    Special Requests   Final    BOTTLES DRAWN AEROBIC ONLY Blood Culture adequate volume Performed at [facility] [address] [place],  [place] [address]    Culture   Final    NO GROWTH 5 DAYS Performed at [facility], [place] [address] [place],  [address]    Report Status 07/09/2020 FINAL  Final    Coagulation Studies: No results for input(s): LABPROT, INR in the last 72 hours.  Urinalysis: No results for input(s): COLORURINE, LABSPEC, PHURINE, GLUCOSEU, HGBUR, BILIRUBINUR, KETONESUR, PROTEINUR, UROBILINOGEN, NITRITE, LEUKOCYTESUR in the last 72 hours.  Invalid input(s): APPERANCEUR    Imaging: No results found.   Medications:   . ceFEPime (MAXIPIME) IV 2 g (07/09/20 1217)   . (feeding supplement) PROSource Plus  30 mL Oral BID BM  . amiodarone  200 mg Oral Daily  . antiseptic oral rinse  15 mL Mouth Rinse Q2H  . chlorhexidine  15 mL Mouth/Throat QID  . Chlorhexidine Gluconate Cloth  6 each Topical Daily  . DULoxetine  60 mg Oral Daily  . Gerhardt's butt cream   Topical QID  . metoprolol tartrate  25 mg Oral BID  . potassium chloride  40 mEq Oral BID  . protein supplement  1 Scoop Oral TID WC  . sodium chloride flush  10-40 mL Intracatheter Q12H  . Tbo-Filgrastim  480 mcg Subcutaneous q1800  . vancomycin  125 mg Oral QID   acetaminophen **OR** acetaminophen, ondansetron **OR** ondansetron (ZOFRAN) IV, sodium chloride flush  Assessment/ Plan:  1. CKD V - baseline creat 5.8- 6.2 and creatinine has stayed near this baseline since admission.  Possibly slight worsening related to sepsis.  Patient follows with Dr. Royce Macadamia outpatient.  Plan is to initiate dialysis when needed. AVF creation was delayed due to acute illness. No urgent indication for HD at this time; trying to delay catheter placement given issues w/ bacteremia and neutropenia 2. Diarrhea/Dehydration. Diarrhea mgmt per primary.  Continue lactated Ringer's 75 cc/h.  Encourage oral hydration.  Consider stopping fluids if kidney function is stable today. 3. Non-Anion Gap Metabolic acidosis -we will place an IV sodium bicarbonate at 125 cc an hour 4. Neutropenic fevers - +pseudomonas bacteremia. Per pmd, on IV abx and IV  granix.  Holding off on port removal for now. 5. Bladder cancer - per ONC, has been getting active chemoRx 6. Atrial fib - Slightly tachy but stable. Mgmt per primary 7. Pancytopenia - per primary, transfusions PRN.  8. Severe Hypokalemia: We will replete with IV potassium 10 mEq x 4 rounds     LOS: 7 Sherril Croon $RemoveBefor'@TODAY'SVrhqaElxVvY$ $Rem'@10'TSRk$ :06 AM

## 2020-07-11 DIAGNOSIS — R7881 Bacteremia: Secondary | ICD-10-CM | POA: Diagnosis not present

## 2020-07-11 DIAGNOSIS — D61818 Other pancytopenia: Secondary | ICD-10-CM | POA: Diagnosis not present

## 2020-07-11 DIAGNOSIS — B965 Pseudomonas (aeruginosa) (mallei) (pseudomallei) as the cause of diseases classified elsewhere: Secondary | ICD-10-CM | POA: Diagnosis not present

## 2020-07-11 DIAGNOSIS — N179 Acute kidney failure, unspecified: Secondary | ICD-10-CM | POA: Diagnosis not present

## 2020-07-11 DIAGNOSIS — D701 Agranulocytosis secondary to cancer chemotherapy: Secondary | ICD-10-CM | POA: Diagnosis not present

## 2020-07-11 LAB — CBC WITH DIFFERENTIAL/PLATELET
HCT: 25.4 % — ABNORMAL LOW (ref 39.0–52.0)
Hemoglobin: 8.7 g/dL — ABNORMAL LOW (ref 13.0–17.0)
MCH: 31.4 pg (ref 26.0–34.0)
MCHC: 34.3 g/dL (ref 30.0–36.0)
MCV: 91.7 fL (ref 80.0–100.0)
Platelets: <5 10*3/uL — CL (ref 150–400)
RBC: 2.77 MIL/uL — ABNORMAL LOW (ref 4.22–5.81)
RDW: 15.4 % (ref 11.5–15.5)
WBC: 0.2 10*3/uL — CL (ref 4.0–10.5)
nRBC: 0 % (ref 0.0–0.2)

## 2020-07-11 LAB — MAGNESIUM: Magnesium: 1.8 mg/dL (ref 1.7–2.4)

## 2020-07-11 LAB — COMPREHENSIVE METABOLIC PANEL
ALT: 35 U/L (ref 0–44)
AST: 27 U/L (ref 15–41)
Albumin: 1.9 g/dL — ABNORMAL LOW (ref 3.5–5.0)
Alkaline Phosphatase: 24 U/L — ABNORMAL LOW (ref 38–126)
Anion gap: 15 (ref 5–15)
BUN: 144 mg/dL — ABNORMAL HIGH (ref 8–23)
CO2: 21 mmol/L — ABNORMAL LOW (ref 22–32)
Calcium: 7.9 mg/dL — ABNORMAL LOW (ref 8.9–10.3)
Chloride: 104 mmol/L (ref 98–111)
Creatinine, Ser: 6.85 mg/dL — ABNORMAL HIGH (ref 0.61–1.24)
GFR, Estimated: 8 mL/min — ABNORMAL LOW (ref >60)
Glucose, Bld: 177 mg/dL — ABNORMAL HIGH (ref 70–99)
Potassium: 2.6 mmol/L — CL (ref 3.5–5.1)
Sodium: 140 mmol/L (ref 135–145)
Total Bilirubin: 2.4 mg/dL — ABNORMAL HIGH (ref 0.3–1.2)
Total Protein: 4.4 g/dL — ABNORMAL LOW (ref 6.5–8.1)

## 2020-07-11 MED ORDER — METOPROLOL TARTRATE 50 MG PO TABS: 75.0000 mg | ORAL_TABLET | Freq: Two times a day (BID) | ORAL | Status: DC

## 2020-07-11 MED ORDER — POTASSIUM CHLORIDE CRYS ER 20 MEQ PO TBCR: 40.0000 meq | EXTENDED_RELEASE_TABLET | Freq: Three times a day (TID) | ORAL | Status: DC

## 2020-07-11 MED ORDER — SODIUM CHLORIDE 0.9% IV SOLUTION: Freq: Once | INTRAVENOUS | Status: AC

## 2020-07-11 MED ORDER — POTASSIUM CHLORIDE CRYS ER 20 MEQ PO TBCR
40.0000 meq | EXTENDED_RELEASE_TABLET | Freq: Three times a day (TID) | ORAL | Status: DC
  Administered 2020-07-11 (×2): 40 meq via ORAL
  Filled 2020-07-11 (×2): qty 2

## 2020-07-11 MED ORDER — POTASSIUM CHLORIDE 10 MEQ/100ML IV SOLN
10.0000 meq | INTRAVENOUS | Status: AC
  Administered 2020-07-11 – 2020-07-12 (×4): 10 meq via INTRAVENOUS
  Filled 2020-07-11 (×4): qty 100

## 2020-07-11 MED ORDER — POTASSIUM CHLORIDE 10 MEQ/100ML IV SOLN
10.0000 meq | INTRAVENOUS | Status: AC
  Administered 2020-07-11 (×4): 10 meq via INTRAVENOUS
  Filled 2020-07-11 (×4): qty 100

## 2020-07-11 MED ORDER — POTASSIUM CHLORIDE 10 MEQ/100ML IV SOLN: 10.0000 meq | INTRAVENOUS | Status: DC

## 2020-07-11 MED ORDER — POTASSIUM CHLORIDE CRYS ER 20 MEQ PO TBCR: 40.0000 meq | EXTENDED_RELEASE_TABLET | Freq: Every evening | ORAL | Status: DC

## 2020-07-11 MED ORDER — METOPROLOL TARTRATE 5 MG/5ML IV SOLN
5.0000 mg | Freq: Two times a day (BID) | INTRAVENOUS | Status: DC
  Administered 2020-07-11 – 2020-07-12 (×2): 5 mg via INTRAVENOUS
  Filled 2020-07-11 (×2): qty 5

## 2020-07-11 MED ORDER — PROSOURCE PLUS PO LIQD
30.0000 mL | Freq: Three times a day (TID) | ORAL | Status: DC
  Administered 2020-07-13 – 2020-07-15 (×3): 30 mL via ORAL
  Filled 2020-07-11 (×4): qty 30

## 2020-07-11 MED ORDER — MAGNESIUM OXIDE 400 (241.3 MG) MG PO TABS
400.0000 mg | ORAL_TABLET | Freq: Two times a day (BID) | ORAL | Status: DC
  Administered 2020-07-11: 400 mg via ORAL
  Filled 2020-07-11: qty 1

## 2020-07-11 MED ORDER — MAGNESIUM SULFATE 2 GM/50ML IV SOLN
2.0000 g | Freq: Once | INTRAVENOUS | Status: AC
  Administered 2020-07-11: 2 g via INTRAVENOUS
  Filled 2020-07-11: qty 50

## 2020-07-11 NOTE — Progress Notes (Signed)
Unfortunately, we are just not making much progress.  His white cell count is still incredibly low.  Today, his white cell count is 0.2.  Hemoglobin 8.7.  Platelet count less than 5000.  We will have to try and give him some platelets again.  I did have a long talk with his wife yesterday.  I explained to her the situation.  I told her that as long as the white cell count is low, he just will not recover that well.  I think everything with his recovery is based upon his white cell count recovery.  The white cell count recovery has taken a lot longer than I would have thought.  He is on Neupogen.  His renal failure is also an issue.  He is still making some urine.  His creatinine today is 6.85.  I do not know what his BUN is.  His bilirubin is 2.4.  His albumin is 1.9.  I told his wife that we will have to see how he manages over the weekend.  If we do not see any meaningful blood count recovery over the weekend, then we may have to seriously consider changing our priorities and thinking about comfort care.  I really would not favor dialysis if we do not see his blood count recovery.  He has metastatic cancer that would not have any further therapy.  I does do not know what dialysis would really accomplish outside of just keeping him alive longer with very little quality of life.  It is hard to say how much he really understands.  I talked to him this morning.  I really cannot have a conversation with him about end-of-life issues and CODE STATUS right now.  I know that he is getting wonderful care from all the staff up on [address]. and from the ID, nephrology, and hospitalist.  Again, his recovery of his white cells is really been a problem.  I really would have thought that his white cells would have recovered by now.  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  This also is an issue.  I really think that the Port-A-Cath can be accessed.  This may make things a bit easier  for him.  He has IVs in both arms.  He has very poor and very limited IV access.  He is still having diarrhea.  Again I really do not see this improving as long as his white cell count is so low.  I think that we need to see how things go over the weekend with him.  Again, it would not see any meaningful improvement in his blood counts, or his renal function deteriorates significantly, I really think that we need to consider comfort care for him.  [name], MD  Romans 3:3-5

## 2020-07-11 NOTE — Progress Notes (Signed)
PROGRESS NOTE  [name] Rose IEP:[id number] DOB: 01/28/1951 DOA: 07/02/2020 PCP: [name] Frees, MD  Brief History   70 year old man PMH including bladder cancer, CKD stage V, atrial fibrillation presented with generalized weakness.  Admitted for pancytopenia.  Found to have Pseudomonas bacteremia.  Nephrology consulted for worsening renal function.  Seen by oncology for pancytopenia.  Has required multiple blood products and has received Neupogen.  Found to have nontoxigenic C. difficile and started on treatment as per infectious disease.  Infectious disease has recommended removing Port-A-Cath however neither surgery nor oncology feel this is prudent at this point.  White count without significant improvement, platelets not responding to transfusion and remain critically low.  Prognosis is guarded, now DNR.    A & P  Pancytopenia secondary to antineoplastic therapy.   Anemia of chronic disease. Patient status post chemotherapy earlier this month.   --Continue transfusion blood products as per oncology including platelets and PRBCs. --Continue filgrastim as per oncology --Hemoglobin remained stable but platelets not responding, less than 5 today.  WBC without change today.  Sepsis secondary to Pseudomonas bacteremia, left axilla cellulitis present on admission --Hemodynamics remain stable.  Will continue antibiotic as per infectious disease.    Atrial fibrillation with rapid ventricular response, chronic atrial fibrillation  --Remains a asymptomatic.  Heart rate borderline.  Continue amiodarone.  Increase metoprolol.  AKI superimposed on CKD stage V with metabolic acidosis, hyperphosphatemia --Continues to slowly worsen.  Urine output 1125.  I do not think he is a good candidate for HD.  Acute metabolic encephalopathy secondary to uremia. --Management by nephrology  Nontoxigenic C. difficile diarrhea present on admission --ID recommended oral vancomycin for 10 days, will complete.   Suspect diarrhea is from chemotherapy or neutropenia.  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  Obesity unspecified Body mass index is 32.89 kg/m.  Disposition Plan:  Discussion: Clinically appears worse today, prognosis is guarded, I am concerned he will not survive this hospitalization.  Platelets are less than 5 and not responding to transfusion even briefly.  WBC failing to improve.  Renal function worsening.  I think recovery is unlikely at this point.  Discussed in detail with wife at bedside.  Reviewed patient's living will.  She believes his wishes would be DNR in the situation.  I concur.  CODE STATUS updated.  Status is: Inpatient  Remains inpatient appropriate because:Ongoing diagnostic testing needed not appropriate for outpatient work up, IV treatments appropriate due to intensity of illness or inability to take PO and Inpatient level of care appropriate due to severity of illness   Dispo: The patient is from: Home              Anticipated d/c is to: Home              Patient currently is not medically stable to d/c.   Difficult to place patient No  DVT prophylaxis: SCDs Start: 07/03/20 1451   Code Status: DNR Level of care: Telemetry Family Communication: wife at bedside 4/1  [name] Hodgkins, MD  [facility] Direct contact: see [email or web address] (further directions at bottom of note if needed) 7PM-7AM contact night coverage as at bottom of note 07/11/2020, 1:43 PM  LOS: 8 days   Significant Hospital Events   . Admit 3/23    Consults:  . Oncology  . Nephrology . ID . General surgery    Procedures:  .   Significant  Diagnostic Tests:  Marland Kitchen    Micro Data:  .    Antimicrobials:  . Cefepime   Interval History/Subjective  CC: f/u pancytopenia  Feels ok day Tired per RN  Objective   Vitals:  Vitals:   07/11/20  1255 07/11/20 1324  BP: 110/79 127/83  Pulse: (!) 102 (!) 104  Resp: 19 18  Temp: 98.7 F (37.1 C) 98.7 F (37.1 C)  SpO2:  100%    Exam:  Constitutional:   . Appears calm and comfortable, ill ENMT:  . grossly normal hearing  Respiratory:  . CTA bilaterally, no w/r/r.  . Respiratory effort normal.  Cardiovascular:  . RRR, no m/r/g . No LE extremity edema   Abdomen:  . soft Musculoskeletal:  . RUE, LUE, RLE, LLE   . Moves all extremities to command Skin:  . Left axilla and chest wounds healing, no significant surrounding erythema noted Psychiatric:  . Mental status . Appears fatigued, opens eyes briefly . "2022"   I have personally reviewed the following:   Today's Data  . Potassium 2.6 . Creatinine worse 6.85 . Hemoglobin 8.8  . WBC no change, 0.2  . Platelets<5 . Hgb stable at 8.7  Scheduled Meds: . (feeding supplement) PROSource Plus  30 mL Oral TID BM  . amiodarone  200 mg Oral Daily  . antiseptic oral rinse  15 mL Mouth Rinse Q2H  . chlorhexidine  15 mL Mouth/Throat QID  . chlorhexidine  15 mL Mouth Rinse BID  . Chlorhexidine Gluconate Cloth  6 each Topical Daily  . DULoxetine  60 mg Oral Daily  . Gerhardt's butt cream   Topical QID  . magnesium oxide  400 mg Oral BID  . mouth rinse  15 mL Mouth Rinse q12n4p  . metoprolol tartrate  75 mg Oral BID  . potassium chloride  40 mEq Oral TID  . protein supplement  1 Scoop Oral TID WC  . sodium chloride flush  10-40 mL Intracatheter Q12H  . Tbo-Filgrastim  480 mcg Subcutaneous q1800  . vancomycin  125 mg Oral QID   Continuous Infusions: . ceFEPime (MAXIPIME) IV 2 g (07/11/20 1221)  . potassium chloride 10 mEq (07/11/20 1317)  . sodium bicarbonate (isotonic) 150 mEq in D5W 1000 mL infusion 125 mL/hr at 07/11/20 0416    Principal Problem:   Pancytopenia ([place]) Active Problems:   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 (HCC)   Thrombocytopenia (HCC)   AKI (acute kidney injury)  ([place])   Bacteremia due to Pseudomonas   Essential hypertension   Chronic atrial fibrillation (HCC)   Neutropenia (HCC)   CKD (chronic kidney disease) stage 5, GFR less than 15 ml/min (HCC)   Weakness generalized   Anemia of chronic disease   Sepsis ([place])   LOS: 8 days   How to contact the [facility] Attending or Consulting provider 7A - 7P or covering provider during after hours 7P -7A, for this patient?  1. Check the care team in [facility] and look for a) attending/consulting TR[facility] provider listed and b) the [facility] team listed 2. Log into [email or web address] and use [place]'s universal password to access. If you do not have the password, please contact the hospital operator. 3. Locate the [facility] provider you are looking for under [facility] and page to a number that you can be directly reached. 4. If you still have difficulty reaching the provider, please page the [facility] (Director on Call) for the Hospitalists listed on amion for assistance.

## 2020-07-11 NOTE — Progress Notes (Signed)
   07/11/20 0818  Assess: MEWS Score  Temp 98.3 F (36.8 C)  BP 127/76  Pulse Rate (!) 122  Resp 19  SpO2 100 %  Assess: MEWS Score  MEWS Temp 0  MEWS Systolic 0  MEWS Pulse 2  MEWS RR 0  MEWS LOC 0  MEWS Score 2  MEWS Score Color Yellow  Assess: if the MEWS score is Yellow or Red  Were vital signs taken at a resting state? Yes  Focused Assessment No change from prior assessment  Early Detection of Sepsis Score *See Row Information* Low  MEWS guidelines implemented *See Row Information* Yes  Treat  MEWS Interventions Administered scheduled meds/treatments  Take Vital Signs  Increase Vital Sign Frequency  Yellow: Q 2hr X 2 then Q 4hr X 2, if remains yellow, continue Q 4hrs  Escalate  MEWS: Escalate Yellow: discuss with charge nurse/RN and consider discussing with provider and RRT  Notify: Charge Nurse/RN  Name of Charge Nurse/RN Notified Merwyn Katos  Date Charge Nurse/RN Notified 07/11/20  Time Charge Nurse/RN Notified [phone]

## 2020-07-11 NOTE — Evaluation (Signed)
Clinical/Bedside Swallow Evaluation Patient Details  Name: [name] Rose MRN: [id number] Date of Birth: November 12, 1950  Today's Date: 07/11/2020 Time: SLP Start Time (ACUTE ONLY): 1335 SLP Stop Time (ACUTE ONLY): 1410 SLP Time Calculation (min) (ACUTE ONLY): 35 min  Past Medical History:  Past Medical History:  Diagnosis Date  . Anemia   . Arthritis   . 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 ([place]) 09/29/2016  . Bladder tumor   . Chronic renal insufficiency    stage IV  . Dyspnea    with exertion  . Essential hypertension 06/23/2018  . Goals of care, counseling/discussion 09/30/2016  . History of prostate cancer followed by pcp dr [name]-  per pt last PSA undetectable   dx 2008-- (Stage T1c, Gleason 3+3,  PSA 4.58, vol 99cc)  s/p  radical prostatectomy (nerve sparing bilateral)   . Hyperglycemia 06/23/2018  . Hypertension   . Mild hyperlipidemia 06/23/2018  . Persistent atrial fibrillation (HCC)    atrial fib  . Pre-diabetes   . Wears glasses    Past Surgical History:  Past Surgical History:  Procedure Laterality Date  . CARDIOVERSION N/A 05/18/2019   Procedure: CARDIOVERSION;  Surgeon: Lelon Perla, MD;  Location[facility] ENDOSCOPY;  Service: Cardiovascular;  Laterality: N/A;  . CARDIOVERSION N/A 09/27/2019   Procedure: CARDIOVERSION;  Surgeon: Donato Heinz, MD;  Location: [place];  Service: Cardiovascular;  Laterality: N/A;  . CATARACT EXTRACTION W/ INTRAOCULAR LENS  IMPLANT, BILATERAL Bilateral 2011  . [place]  . ESOPHAGOGASTRODUODENOSCOPY N/A 02/19/2020   Procedure: ESOPHAGOGASTRODUODENOSCOPY (EGD);  Surgeon: Wilford Corner, MD;  Location: [place];  Service: Gastroenterology;  Laterality: N/A;  . GIVENS CAPSULE STUDY N/A 02/19/2020   Procedure: GIVENS CAPSULE STUDY;  Surgeon: Wilford Corner, MD;  Location: [place];  Service: Gastroenterology;  Laterality: N/A;  . IR FLUORO GUIDE PORT INSERTION RIGHT  10/07/2016   . IR RADIOLOGIST EVAL & MGMT  01/05/2018  . IR US GUIDE VASC ACCESS RIGHT  10/07/2016  . KNEE ARTHROSCOPY Bilateral right 2006;  left 02-15-2007  . [place];  1990;  1983  . ROBOT ASSISTED LAPAROSCOPIC RADICAL PROSTATECTOMY  06/20/2006   bilateral nerve sparing  . TEE WITHOUT CARDIOVERSION N/A 06/27/2018   Procedure: TRANSESOPHAGEAL ECHOCARDIOGRAM (TEE);  Surgeon: Nigel Mormon, MD;  Location[facility];  Service: Cardiovascular;  Laterality: N/A;  . TOTAL HIP ARTHROPLASTY Left 03/03/2015   Procedure: LEFT TOTAL HIP ARTHROPLASTY ANTERIOR APPROACH;  Surgeon: Dorna Leitz, MD;  Location: [place];  Service: Orthopedics;  Laterality: Left;  . TOTAL KNEE ARTHROPLASTY Bilateral left 08-13-2009;  right 12-26-2009  . TRANSURETHRAL RESECTION OF BLADDER TUMOR N/A 09/20/2016   Procedure: TRANSURETHRAL RESECTION OF BLADDER TUMOR (TURBT);  Surgeon: Franchot Gallo, MD;  Location: [facility];  Service: Urology;  Laterality: N/A;  . TRANSURETHRAL RESECTION OF BLADDER TUMOR WITH MITOMYCIN-C N/A 08/06/2019   Procedure: TRANSURETHRAL RESECTION OF BLADDER TUMOR;  Surgeon: Franchot Gallo, MD;  [facility];  Service: Urology;  Laterality: N/A;   HPI:  70 year old man PMH including bladder cancer with mets to intra-abdominal lymph nodes, CKD stage V, atrial fibrillation presented with generalized weakness.  Admitted for pancytopenia.  Found to have sepsis secondary to Pseudomonas bacteremia.  Nephrology consulted for worsening renal function.  Seen by oncology for pancytopenia, s/p chemo last month.  Found to have nontoxigenic C. difficile and started on treatment as per infectious disease. Pt was followed by SLP services during 09/2018 admission for  dysphagia related to prolonged intubation and deconditioning. He was D/Cd from SLP on a regular diet with thin liquids.   Assessment / Plan / Recommendation Clinical Impression  Pt presents with  concerns for dysphagia with potential for aspiration of thin and nectar-thick liquids.  [name] Rose was present for assessment.  Oral mechanism exam revealed dry musosa; lesions on anterior tongue and inside lower lip.  Pt winced upon swallowing but did not affirm or deny pain when swallowing. Unable to visualize palate/posterior oral cavity to determine if there were more lesions.  Pt accepted teaspoons of applesauce with overall delays but no concerns for airway protection or bolus transfer.  Thin and nectar thick trials led to consistent and immediate coughing regardless of delivery (spoon and straw), concerning for aspiration.  Level of alertness is certainly impacting swallowing safety; odynophagia/mucositis may be a contributing factor as well.   Recommend downgrading diet to dysphagia 1. Allow sips of thin water/ice chips after oral care and at least 30 min after a meal (no ice chips/thin water during meal).  Dr. Everett Graff progress note from this afternoon just read - he had a discussion with [name] Rose about [place].  Pt's daughter is arriving tonight and Dr. Sarajane Jews and family will re-assess tomorrow.  SLP will follow along for plan. Please hold POs if pt is coughing and uncomfortable. SLP Visit Diagnosis: Dysphagia, unspecified (R13.10)    Aspiration Risk  Moderate aspiration risk    Diet Recommendation   dysphagia 1; allow sips of water/ice chips AFTER or BEFORE but not during meal  Medication Administration: Whole meds with puree    Other  Recommendations Oral Care Recommendations: Oral care BID   Follow up Recommendations Other (comment) (tba)      Frequency and Duration min 2x/week  1 week       Prognosis Prognosis for Safe Diet Advancement: Guarded      Swallow Study   General Date of Onset: 07/02/20 HPI: 70 year old man PMH including bladder cancer with mets to intra-abdominal lymph nodes, CKD stage V, atrial fibrillation presented with generalized weakness.  Admitted  for pancytopenia.  Found to have sepsis secondary to Pseudomonas bacteremia.  Nephrology consulted for worsening renal function.  Seen by oncology for pancytopenia, s/p chemo last month.  Found to have nontoxigenic C. difficile and started on treatment as per infectious disease. Pt was followed by SLP services during 09/2018 admission for dysphagia related to prolonged intubation and deconditioning. He was D/Cd from SLP on a regular diet with thin liquids. Type of Study: Bedside Swallow Evaluation Previous Swallow Assessment: see HPI Diet Prior to this Study: Dysphagia 2 (chopped);Thin liquids Temperature Spikes Noted: No Respiratory Status: Nasal cannula History of Recent Intubation: No Behavior/Cognition: Lethargic/Drowsy Oral Cavity Assessment: Lesions;Dried secretions Oral Care Completed by SLP: Yes Self-Feeding Abilities: Total assist Patient Positioning: Upright in bed Baseline Vocal Quality: Normal Volitional Cough: Strong Volitional Swallow: Unable to elicit    Oral/Motor/Sensory Function Overall Oral Motor/Sensory Function: Within functional limits   Ice Chips Ice chips: Within functional limits   Thin Liquid Thin Liquid: Impaired Presentation: Spoon;Straw Pharyngeal  Phase Impairments: Cough - Immediate    Nectar Thick Nectar Thick Liquid: Impaired Presentation: Spoon;Straw Pharyngeal Phase Impairments: Cough - Immediate   Honey Thick Honey Thick Liquid: Not tested   Puree Puree: Within functional limits   Solid     Solid: Not tested     Estill Bamberg L. Tivis Ringer, [place] Office number [phone] Pager [phone]  Juan Quam Laurice 07/11/2020,2:22  PM

## 2020-07-11 NOTE — Progress Notes (Signed)
[facility] ROUNDING NOTE   Subjective:   Brief history.   70 year old gentleman history of bladder cancer stage V chronic kidney disease atrial fibrillation admitted with neutropenic fever pancytopenia and Pseudomonas bacteremia.  Acute on chronic kidney disease changes baseline creatinine about 6 mg/dL.  Blood pressure 111/80 pulse 107 temperature 98  Urine output 1600 cc 07/10/2020  Sodium 140 potassium 2.6 chloride 104 CO2 21 BUN 144 creatinine 6.85 glucose 177 hemoglobin 8.7    Objective:  Vital signs in last 24 hours:  Temp:  [97.9 F (36.6 C)-98.7 F (37.1 C)] 98 F (36.7 C) (04/01 1054) Pulse Rate:  [104-122] 116 (04/01 1054) Resp:  [18-20] 18 (04/01 1054) BP: (109-127)/(68-80) 111/80 (04/01 1054) SpO2:  [93 %-100 %] 98 % (04/01 1054)  Weight change:  Filed Weights   07/06/20 0555 07/08/20 0500 07/09/20 0520  Weight: 114.1 kg 115.8 kg 116.2 kg    Intake/Output: I/O last 3 completed shifts: In: 1006.8 [P.O.:240; Blood:766.8] Out: 2125 [Urine:2125]   Intake/Output this shift:  Total I/O In: 711.8 [Blood:711.8] Out: -   GEN: Chronically ill-appearing, lying in bed, nad ENT: no nasal discharge, mmm EYES: no scleral icterus, eomi CV: Tachycardic PULM: no iwob, bilateral chest rise ABD: NABS, non-distended SKIN: no rashes or jaundice EXT: no edema, warm and well perfused   Basic Metabolic Panel: Recent Labs  Lab 07/05/20 1054 07/06/20 0528 07/07/20 0506 07/07/20 1325 07/08/20 0519 07/09/20 0840 07/10/20 0525 07/11/20 0455  NA 129*  127* 130* 132* 133* 132* 133* 134* 140  K 3.2*  3.2* 3.9 2.8* 3.0* 3.1* 2.9* 2.8* 2.6*  CL 104  103 106 102 103 103 103 104 104  CO2 13*  14* 15* 17* 18* 16* 17* 14* 21*  GLUCOSE 158*  162* 135* 127* 146* 156* 125* 129* 177*  BUN 89*  86* 92* 91* 95* 97* 111* 130* 144*  CREATININE 6.77*  5.91* 6.71* 6.20* 6.30* 6.07* 6.32* 6.76* 6.85*  CALCIUM 7.7*  7.7* 8.1* 7.6* 7.7* 7.7* 8.0* 7.7* 7.9*  MG  1.7  1.7 1.9 1.7  --  1.9 1.9  --  1.8  PHOS 4.2  4.1 4.3 5.1*  --  4.6 5.5*  --   --     Liver Function Tests: Recent Labs  Lab 07/07/20 0506 07/08/20 0519 07/09/20 0840 07/10/20 0525 07/11/20 0455  AST 15 12* 14* 19 27  ALT $Re'26 22 22 25 'gRi$ 35  ALKPHOS 28* 27* 23* 24* 24*  BILITOT 1.0 1.6* 1.8* 2.4* 2.4*  PROT 4.8* 4.8* 4.7* 4.5* 4.4*  ALBUMIN 2.1* 2.1* 2.0* 1.8* 1.9*   No results for input(s): LIPASE, AMYLASE in the last 168 hours. No results for input(s): AMMONIA in the last 168 hours.  CBC: Recent Labs  Lab 07/06/20 0528 07/07/20 0506 07/08/20 0519 07/09/20 0836 07/10/20 0525 07/11/20 0455  WBC <0.1* <0.1* 0.1* 0.1* 0.2* 0.2*  NEUTROABS Not Measured  --   --   --   --   --   HGB 7.6* 7.3* 9.3* 9.4* 8.8* 8.7*  HCT 22.7* 21.6* 27.4* 27.8* 26.3* 25.4*  MCV 97.0 94.7 93.2 93.6 94.3 91.7  PLT 5* 13* 6* 7* 9* <5*    Cardiac Enzymes: No results for input(s): CKTOTAL, CKMB, CKMBINDEX, TROPONINI in the last 168 hours.  BNP: Invalid input(s): POCBNP  CBG: No results for input(s): GLUCAP in the last 168 hours.  Microbiology: Results for orders placed or performed during the hospital encounter of 07/02/20  Blood culture (routine x 2)  Status: None   Collection Time: 07/03/20  1:28 AM   Specimen: BLOOD  Result Value Ref Range Status   Specimen Description   Final    BLOOD RIGHT ANTECUBITAL Performed at [facility] [address], [place], [place] [address]    Special Requests   Final    BOTTLES DRAWN AEROBIC AND ANAEROBIC Blood Culture results may not be optimal due to an excessive volume of blood received in culture bottles Performed at [facility] [address], [place], [place] [address]    Culture   Final    NO GROWTH 5 DAYS Performed at [facility], [place] [address] [place], [place] [address]    Report Status 07/08/2020 FINAL  Final  Blood culture (routine x 2)     Status: Abnormal   Collection Time:  07/03/20  1:33 AM   Specimen: BLOOD  Result Value Ref Range Status   Specimen Description   Final    BLOOD RIGHT HAND Performed at [facility] [address], [place], [place] [address]    Special Requests   Final    BOTTLES DRAWN AEROBIC AND ANAEROBIC Blood Culture results may not be optimal due to an excessive volume of blood received in culture bottles Performed at [facility] [address], [place], [place] [address]    Culture  Setup Time   Final    AEROBIC BOTTLE ONLY GRAM NEGATIVE RODS CRITICAL RESULT CALLED TO, READ BACK BY AND VERIFIED WITHSeleta Rhymes [facility] 07/04/20 0252 JDW Performed at [facility], [place] [address], [place], [place] [address]    Culture PSEUDOMONAS AERUGINOSA (A)  Final   Report Status 07/05/2020 FINAL  Final   Organism ID, Bacteria PSEUDOMONAS AERUGINOSA  Final      Susceptibility   Pseudomonas aeruginosa - MIC*    CEFTAZIDIME 4 SENSITIVE Sensitive     CIPROFLOXACIN <=0.25 SENSITIVE Sensitive     GENTAMICIN <=1 SENSITIVE Sensitive     IMIPENEM 2 SENSITIVE Sensitive     PIP/TAZO 8 SENSITIVE Sensitive     CEFEPIME 2 SENSITIVE Sensitive     * PSEUDOMONAS AERUGINOSA  Resp Panel by RT-PCR (Flu A&B, Covid) Nasopharyngeal Swab     Status: None   Collection Time: 07/03/20  1:33 AM   Specimen: Nasopharyngeal Swab; Nasopharyngeal(NP) swabs in vial transport medium  Result Value Ref Range Status   SARS Coronavirus 2 by RT PCR NEGATIVE NEGATIVE Final    Comment: (NOTE) SARS-CoV-2 target nucleic acids are NOT DETECTED.  The SARS-CoV-2 RNA is generally detectable in upper respiratory specimens during the acute phase of infection. The lowest concentration of SARS-CoV-2 viral copies this assay can detect is 138 copies/mL. A negative result does not preclude SARS-Cov-2 infection and should not be used as the sole basis for treatment or other patient management decisions. A negative result may occur with  improper  specimen collection/handling, submission of specimen other than nasopharyngeal swab, presence of viral mutation(s) within the areas targeted by this assay, and inadequate number of viral copies(<138 copies/mL). A negative result must be combined with clinical observations, patient history, and epidemiological information. The expected result is Negative.  Fact Sheet for Patients:  EntrepreneurPulse.com.au  Fact Sheet for Healthcare Providers:  IncredibleEmployment.be  This test is no t yet approved or cleared by the [place] FDA and  has been authorized for detection and/or diagnosis of SARS-CoV-2 by FDA under an Emergency Use Authorization (EUA). This EUA will remain  in effect (meaning this test can be used) for the duration of the COVID-19 declaration under Section 564(b)(1) of the Act, 21 U.S.C.section 360bbb-3(b)(1), unless the authorization is terminated  or revoked sooner.       Influenza A by PCR NEGATIVE NEGATIVE Final   Influenza B by PCR NEGATIVE NEGATIVE Final    Comment: (NOTE) The Xpert Xpress SARS-CoV-2/FLU/RSV plus assay is intended as an aid in the diagnosis of influenza from Nasopharyngeal swab specimens and should not be used as a sole basis for treatment. Nasal washings and aspirates are unacceptable for Xpert Xpress SARS-CoV-2/FLU/RSV testing.  Fact Sheet for Patients: EntrepreneurPulse.com.au  Fact Sheet for Healthcare Providers: IncredibleEmployment.be  This test is not yet approved or cleared by the [place] FDA and has been authorized for detection and/or diagnosis of SARS-CoV-2 by FDA under an Emergency Use Authorization (EUA). This EUA will remain in effect (meaning this test can be used) for the duration of the COVID-19 declaration under Section 564(b)(1) of the Act, 21 U.S.C. section 360bbb-3(b)(1), unless the authorization is terminated or revoked.  Performed at  [facility], [place] [address] [place], [place] [address]   Blood Culture ID Panel (Reflexed)     Status: Abnormal   Collection Time: 07/03/20  1:33 AM  Result Value Ref Range Status   Enterococcus faecalis NOT DETECTED NOT DETECTED Final   Enterococcus Faecium NOT DETECTED NOT DETECTED Final   Listeria monocytogenes NOT DETECTED NOT DETECTED Final   Staphylococcus species NOT DETECTED NOT DETECTED Final   Staphylococcus aureus (BCID) NOT DETECTED NOT DETECTED Final   Staphylococcus epidermidis NOT DETECTED NOT DETECTED Final   Staphylococcus lugdunensis NOT DETECTED NOT DETECTED Final   Streptococcus species NOT DETECTED NOT DETECTED Final   Streptococcus agalactiae NOT DETECTED NOT DETECTED Final   Streptococcus pneumoniae NOT DETECTED NOT DETECTED Final   Streptococcus pyogenes NOT DETECTED NOT DETECTED Final   A.calcoaceticus-baumannii NOT DETECTED NOT DETECTED Final   Bacteroides fragilis NOT DETECTED NOT DETECTED Final   Enterobacterales NOT DETECTED NOT DETECTED Final   Enterobacter cloacae complex NOT DETECTED NOT DETECTED Final   Escherichia coli NOT DETECTED NOT DETECTED Final   Klebsiella aerogenes NOT DETECTED NOT DETECTED Final   Klebsiella oxytoca NOT DETECTED NOT DETECTED Final   Klebsiella pneumoniae NOT DETECTED NOT DETECTED Final   Proteus species NOT DETECTED NOT DETECTED Final   Salmonella species NOT DETECTED NOT DETECTED Final   Serratia marcescens NOT DETECTED NOT DETECTED Final   Haemophilus influenzae NOT DETECTED NOT DETECTED Final   Neisseria meningitidis NOT DETECTED NOT DETECTED Final   Pseudomonas aeruginosa DETECTED (A) NOT DETECTED Final    Comment: CRITICAL RESULT CALLED TO, READ BACK BY AND VERIFIED WITH: E JACKSON PHARMD 07/04/20 0252 JDW    Stenotrophomonas maltophilia NOT DETECTED NOT DETECTED Final   Candida albicans NOT DETECTED NOT DETECTED Final   Candida auris NOT DETECTED NOT DETECTED Final   Candida glabrata NOT  DETECTED NOT DETECTED Final   Candida krusei NOT DETECTED NOT DETECTED Final   Candida parapsilosis NOT DETECTED NOT DETECTED Final   Candida tropicalis NOT DETECTED NOT DETECTED Final   Cryptococcus neoformans/gattii NOT DETECTED NOT DETECTED Final   CTX-M ESBL NOT DETECTED NOT DETECTED Final   Carbapenem resistance IMP NOT DETECTED NOT DETECTED Final   Carbapenem resistance KPC NOT DETECTED NOT DETECTED Final   Carbapenem resistance NDM NOT DETECTED NOT DETECTED Final   Carbapenem resistance VIM NOT DETECTED NOT DETECTED Final    Comment: Performed at [facility], [place] [address], [place], [place] [address]  C Difficile Quick Screen w PCR reflex     Status: Abnormal   Collection Time: 07/03/20  7:01 PM   Specimen: STOOL  Result Value Ref Range Status   C Diff antigen POSITIVE (A) NEGATIVE Final   C Diff toxin NEGATIVE NEGATIVE Final   C Diff interpretation Results are indeterminate. See PCR results.  Final    Comment: Performed at [facility], [place] [address] [place], [place] [address]  Gastrointestinal Panel by PCR , Stool     Status: None   Collection Time: 07/03/20  7:01 PM   Specimen: STOOL  Result Value Ref Range Status   Campylobacter species NOT DETECTED NOT DETECTED Final   Plesimonas shigelloides NOT DETECTED NOT DETECTED Final   Salmonella species NOT DETECTED NOT DETECTED Final   Yersinia enterocolitica NOT DETECTED NOT DETECTED Final   Vibrio species NOT DETECTED NOT DETECTED Final   Vibrio cholerae NOT DETECTED NOT DETECTED Final   Enteroaggregative E coli (EAEC) NOT DETECTED NOT DETECTED Final   Enteropathogenic E coli (EPEC) NOT DETECTED NOT DETECTED Final   Enterotoxigenic E coli (ETEC) NOT DETECTED NOT DETECTED Final   Shiga like toxin producing E coli (STEC) NOT DETECTED NOT DETECTED Final   Shigella/Enteroinvasive E coli (EIEC) NOT DETECTED NOT DETECTED Final   Cryptosporidium NOT DETECTED NOT DETECTED Final   Cyclospora cayetanensis  NOT DETECTED NOT DETECTED Final   Entamoeba histolytica NOT DETECTED NOT DETECTED Final   Giardia lamblia NOT DETECTED NOT DETECTED Final   Adenovirus F40/41 NOT DETECTED NOT DETECTED Final   Astrovirus NOT DETECTED NOT DETECTED Final   Norovirus GI/GII NOT DETECTED NOT DETECTED Final   Rotavirus A NOT DETECTED NOT DETECTED Final   Sapovirus (I, II, IV, and V) NOT DETECTED NOT DETECTED Final    Comment: Performed at [facility], [place] [place], [place] [address]  C. Diff by PCR, Reflexed     Status: None   Collection Time: 07/03/20  7:01 PM  Result Value Ref Range Status   Toxigenic C. Difficile by PCR NEGATIVE NEGATIVE Final    Comment: Patient is colonized with non toxigenic C. difficile. May not need treatment unless significant symptoms are present. Performed at [facility], [place] [address] [place], [place] [address]   Culture, blood (routine x 2)     Status: None   Collection Time: 07/04/20  9:43 AM   Specimen: BLOOD LEFT HAND  Result Value Ref Range Status   Specimen Description   Final    BLOOD LEFT HAND Performed at [facility] [address], [place], [place] [address]    Special Requests   Final    BOTTLES DRAWN AEROBIC ONLY Blood Culture adequate volume Performed at [facility] [address] [place], [place] [address]    Culture   Final    NO GROWTH 5 DAYS Performed at [facility], [place] [address] [place], [place] [address]    Report Status 07/09/2020 FINAL  Final  Culture, blood (routine x 2)     Status: None   Collection Time: 07/04/20  9:43 AM   Specimen: BLOOD LEFT HAND  Result Value Ref Range Status   Specimen Description   Final    BLOOD LEFT HAND Performed at [facility] [address] [place],  [address]    Special Requests   Final    BOTTLES DRAWN  AEROBIC ONLY Blood Culture adequate volume Performed at [facility]  [address] [place], [place] [address]    Culture   Final    NO GROWTH 5 DAYS Performed at [facility], [place] [address] [place], [place] [address]    Report Status 07/09/2020 FINAL  Final    Coagulation Studies: No results for input(s): LABPROT, INR in the last 72 hours.  Urinalysis: No results for input(s): COLORURINE, LABSPEC, PHURINE, GLUCOSEU, HGBUR, BILIRUBINUR, KETONESUR, PROTEINUR, UROBILINOGEN, NITRITE, LEUKOCYTESUR in the last 72 hours.  Invalid input(s): APPERANCEUR    Imaging: No results found.   Medications:   . ceFEPime (MAXIPIME) IV 2 g (07/10/20 1312)  . sodium bicarbonate (isotonic) 150 mEq in D5W 1000 mL infusion 125 mL/hr at 07/11/20 0416   . (feeding supplement) PROSource Plus  30 mL Oral TID BM  . amiodarone  200 mg Oral Daily  . antiseptic oral rinse  15 mL Mouth Rinse Q2H  . chlorhexidine  15 mL Mouth/Throat QID  . chlorhexidine  15 mL Mouth Rinse BID  . Chlorhexidine Gluconate Cloth  6 each Topical Daily  . DULoxetine  60 mg Oral Daily  . Gerhardt's butt cream   Topical QID  . magnesium oxide  400 mg Oral BID  . mouth rinse  15 mL Mouth Rinse q12n4p  . metoprolol tartrate  50 mg Oral BID  . potassium chloride  40 mEq Oral TID  . protein supplement  1 Scoop Oral TID WC  . sodium chloride flush  10-40 mL Intracatheter Q12H  . Tbo-Filgrastim  480 mcg Subcutaneous q1800  . vancomycin  125 mg Oral QID   acetaminophen **OR** acetaminophen, ondansetron **OR** ondansetron (ZOFRAN) IV, sodium chloride flush  Assessment/ Plan:  1. CKD V - baseline creat 5.8- 6.2 and creatinine has stayed near this baseline since admission.  Possibly slight worsening related to sepsis.  Patient follows with Dr. Royce Macadamia outpatient.  Plan is to initiate dialysis when needed. AVF creation was delayed due to acute illness. No urgent indication for HD at this time; trying to delay catheter placement given issues w/ bacteremia and neutropenia 2. Diarrhea/Dehydration. Diarrhea  mgmt per primary.  Continue lactated Ringer's 75 cc/h.  Encourage oral hydration.  Consider stopping fluids if kidney function is stable today. 3. Non-Anion Gap Metabolic acidosis -we will place an IV sodium bicarbonate at 125 cc an hour 4. Neutropenic fevers - +pseudomonas bacteremia. Per pmd, on IV abx and IV granix.  Holding off on port removal for now. 5. Bladder cancer - per ONC, has been getting active chemoRx 6. Atrial fib - Slightly tachy but stable. Mgmt per primary 7. Pancytopenia - per primary, transfusions PRN.  8. Severe Hypokalemia: We will replete with IV potassium 10 mEq x 4 rounds     LOS: 8 Sherril Croon $RemoveBefor'@TODAY'[id number]$ $Rem'@12'Ydvf$ :00 PM

## 2020-07-11 NOTE — Care Plan (Signed)
Advance Care Planning   Purpose of Encounter Review patient's living will with wife, discussed diagnosis and prognosis.   Parties in Attendance Wife at bedside.  Discussion:  Discussed in detail regarding  current diagnoses and treatment, reviewed living will which indicated in the event of terminal or incurable illness the patient would not want extraordinary measures and would be willing to discontinue fluids on advice of physician.  Discussed with wife the point of account is not even briefly responded to transfusions, neutropenia persists and kidney function continues to worsen.  She wishes for the patient to be DNR based on wishes he had expressed.  She understands he is critically ill and may not survive.  She is waiting for her daughter to arrive this evening and reevaluate tomorrow.  She clearly understands the grave issues at this time.  No escalation of care desired.   Discussed CODE STATUS.  The patient/POA/family decided DNR   Orders entered and CODE STATUS updated.  [name], MD [facility]

## 2020-07-11 NOTE — Progress Notes (Signed)
[facility]  Date of Admission:  07/02/2020     CC: sepsis/pseudomonal bacteremia  Lines: Chronic port (not accessed this admission) Peripheral iv's  Abx: 3/27-c po vanc 3/24-c cefepime 2 gram iv daily renally adjusted  3/25-26 cipro  Other: g-csf Trodelvy, last dose 3/21  ASSESSMENT: Sepsis/Pseudomonas bacteremia Left axillary induration/abscess Neutropenic fever Non-toxogenic cdiff colonization; diarrhea Presence of chronic Port S/p bilateral knee/left hip arthroplasty -- assymptomatic during bacteremic episode Bladder cancer with intraabd lymph node metastasis Chemotherapy associated pancytopenia Afib, on chronic DOAC (being hold for thrombocytopenia) CKD5  70 yo male with bilateral knee and left hip arthroplasty, recurrent metastatic bladder cancer, chronic port, on chemo with Ivette Loyal (last cycle 06/23/2020 -- day 8 on 3/21) admitted 3/23 for generalized weakness found to have sepsis/pseudomonal bacteremia complicated by severe neutropenia    #pseudomonas bacteremia #presence of chronic port #chemo induced neutropenia #metastatic bladder cancer #left chest cellulitis No fever here 3/23 bcx positive but repeat bcx negative He had cellulitic change left chest; u/s without abscess -- appears to be improving with treatment  Source potentially the left chest cellulitis/skin maceration, and so far doesn't appear the port is seeded  -no change in plan; finish 14 days abx. Oral cipro can be used, but he is not taking po well and having lots of diarrhea. He likely will remain in house so can finish 14 day cefepime here on 07/18/2020 -noted goals of care discussion with family and primary team -id will sign off, please call if further question  #diarrhea Gi pcr negative cdiff screen only positive for antigen but toxin negative Unclear if improving on PO vancomycin I suspect chemo related rather than cdiff, however can finish 10  day po vanc  -finish 10 day oral vanc -I wouldn't change or add additional coverage for cdiff at this time as unlikely the nontoxigenic cdiff is playing a role in his current diarrhea   #?rash Doesn't appear to be palpable or ecthyma  I query if trodelvy related He is assymptomatic from rash standpoint  - monitor     Principal Problem:   Pancytopenia ([place]) Active Problems:   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 (HCC)   Thrombocytopenia (HCC)   AKI (acute kidney injury) ([place])   Bacteremia due to Pseudomonas   Essential hypertension   Chronic atrial fibrillation (HCC)   Neutropenia (HCC)   CKD (chronic kidney disease) stage 5, GFR less than 15 ml/min (HCC)   Weakness generalized   Anemia of chronic disease   Sepsis ([place])   Scheduled Meds: . (feeding supplement) PROSource Plus  30 mL Oral TID BM  . amiodarone  200 mg Oral Daily  . antiseptic oral rinse  15 mL Mouth Rinse Q2H  . chlorhexidine  15 mL Mouth/Throat QID  . chlorhexidine  15 mL Mouth Rinse BID  . Chlorhexidine Gluconate Cloth  6 each Topical Daily  . DULoxetine  60 mg Oral Daily  . Gerhardt's butt cream   Topical QID  . magnesium oxide  400 mg Oral BID  . mouth rinse  15 mL Mouth Rinse q12n4p  . metoprolol tartrate  75 mg Oral BID  . potassium chloride  40 mEq Oral TID  . protein supplement  1 Scoop Oral TID WC  . sodium chloride flush  10-40 mL Intracatheter Q12H  . Tbo-Filgrastim  480 mcg Subcutaneous q1800  . vancomycin  125 mg Oral QID   Continuous Infusions: . ceFEPime (MAXIPIME) IV 2  g (07/11/20 1221)  . potassium chloride 10 mEq (07/11/20 1536)  . sodium bicarbonate (isotonic) 150 mEq in D5W 1000 mL infusion 125 mL/hr at 07/11/20 1431   PRN Meds:.acetaminophen **OR** acetaminophen, ondansetron **OR** ondansetron (ZOFRAN) IV, sodium chloride flush   SUBJECTIVE: Continue to be afebrile, but extremely debilitated and neutropenic/pancytopenic Goals of care to dnr was  discussed Continue to have diarrhea Rash not worse No headache/joint pain Hemodynamics is within norma limit/stable outside of sinus tach  Review of Systems: ROS All other ros negative  No Known Allergies  OBJECTIVE: Vitals:   07/11/20 1054 07/11/20 1255 07/11/20 1324 07/11/20 1424  BP: 111/80 110/79 127/83 (!) 150/97  Pulse: (!) 116 (!) 102 (!) 104 (!) 115  Resp: $Remo'18 19 18   'oCadn$ Temp: 98 F (36.7 C) 98.7 F (37.1 C) 98.7 F (37.1 C) 97.9 F (36.6 C)  TempSrc: Oral Axillary Axillary Oral  SpO2: 98%  100% 100%  Weight:      Height:       Body mass index is 32.89 kg/m.  Physical Exam General/constitutional: chronically ill appearing, very lethargic, some verbal; cooperative HEENT: Normocephalic, PER, Conj Clear, EOMI, Oropharynx dry/chapped lips Neck supple CV: rrr no mrg Lungs: clear to auscultation, normal respiratory effort Abd: Soft, Nontender Ext: no edema Skin: stable faint macular erythema rash trungk/ext Neuro: nonfocal; generalized weakness 4/5 ext strength symmetric MSK: no peripheral joint swelling/tenderness/warmth; back spines nontender   Central line presence: port site not assessed  Lab Results Lab Results  Component Value Date   WBC 0.2 (LL) 07/11/2020   HGB 8.7 (L) 07/11/2020   HCT 25.4 (L) 07/11/2020   MCV 91.7 07/11/2020   PLT <5 (LL) 07/11/2020    Lab Results  Component Value Date   CREATININE 6.85 (H) 07/11/2020   BUN 144 (H) 07/11/2020   NA 140 07/11/2020   K 2.6 (LL) 07/11/2020   CL 104 07/11/2020   CO2 21 (L) 07/11/2020    Lab Results  Component Value Date   ALT 35 07/11/2020   AST 27 07/11/2020   ALKPHOS 24 (L) 07/11/2020   BILITOT 2.4 (H) 07/11/2020     Microbiology: Recent Results (from the past 240 hour(s))  Blood culture (routine x 2)     Status: None   Collection Time: 07/03/20  1:28 AM   Specimen: BLOOD  Result Value Ref Range Status   Specimen Description   Final    BLOOD RIGHT ANTECUBITAL Performed at [facility], [place] [address] [place], [place] [address]    Special Requests   Final    BOTTLES DRAWN AEROBIC AND ANAEROBIC Blood Culture results may not be optimal due to an excessive volume of blood received in culture bottles Performed at [place] [address], [place], [place] [address]    Culture   Final    NO GROWTH 5 DAYS Performed at [facility], [place] [address], [place], [place] [address]    Report Status 07/08/2020 FINAL  Final  Blood culture (routine x 2)     Status: Abnormal   Collection Time: 07/03/20  1:33 AM   Specimen: BLOOD  Result Value Ref Range Status   Specimen Description   Final    BLOOD RIGHT HAND Performed at [place] [address], [place],  [address]    Special Requests   Final    BOTTLES DRAWN AEROBIC AND ANAEROBIC Blood Culture results may not be optimal due to an excessive volume  of blood received in culture bottles Performed at [facility], [address] [place], [place] [address]    Culture  Setup Time   Final    AEROBIC BOTTLE ONLY GRAM NEGATIVE RODS CRITICAL RESULT CALLED TO, READ BACK BY AND VERIFIED WITHErling Cruz [facility] 07/04/20 0252 JDW Performed at [facility], [address] [place], [place] [address]    Culture PSEUDOMONAS AERUGINOSA (A)  Final   Report Status 07/05/2020 FINAL  Final   Organism ID, Bacteria PSEUDOMONAS AERUGINOSA  Final      Susceptibility   Pseudomonas aeruginosa - MIC*    CEFTAZIDIME 4 SENSITIVE Sensitive     CIPROFLOXACIN <=0.25 SENSITIVE Sensitive     GENTAMICIN <=1 SENSITIVE Sensitive     IMIPENEM 2 SENSITIVE Sensitive     PIP/TAZO 8 SENSITIVE Sensitive     CEFEPIME 2 SENSITIVE Sensitive     * PSEUDOMONAS AERUGINOSA  Resp Panel by RT-PCR (Flu A&B, Covid) Nasopharyngeal Swab     Status: None   Collection Time: 07/03/20  1:33 AM   Specimen: Nasopharyngeal Swab; Nasopharyngeal(NP) swabs in vial transport medium   Result Value Ref Range Status   SARS Coronavirus 2 by RT PCR NEGATIVE NEGATIVE Final    Comment: (NOTE) SARS-CoV-2 target nucleic acids are NOT DETECTED.  The SARS-CoV-2 RNA is generally detectable in upper respiratory specimens during the acute phase of infection. The lowest concentration of SARS-CoV-2 viral copies this assay can detect is 138 copies/mL. A negative result does not preclude SARS-Cov-2 infection and should not be used as the sole basis for treatment or other patient management decisions. A negative result may occur with  improper specimen collection/handling, submission of specimen other than nasopharyngeal swab, presence of viral mutation(s) within the areas targeted by this assay, and inadequate number of viral copies(<138 copies/mL). A negative result must be combined with clinical observations, patient history, and epidemiological information. The expected result is Negative.  Fact Sheet for Patients:  BloggerCourse.com  Fact Sheet for Healthcare Providers:  SeriousBroker.it  This test is no t yet approved or cleared by the [place] FDA and  has been authorized for detection and/or diagnosis of SARS-CoV-2 by FDA under an Emergency Use Authorization (EUA). This EUA will remain  in effect (meaning this test can be used) for the duration of the COVID-19 declaration under Section 564(b)(1) of the Act, 21 U.S.C.section 360bbb-3(b)(1), unless the authorization is terminated  or revoked sooner.       Influenza A by PCR NEGATIVE NEGATIVE Final   Influenza B by PCR NEGATIVE NEGATIVE Final    Comment: (NOTE) The Xpert Xpress SARS-CoV-2/FLU/RSV plus assay is intended as an aid in the diagnosis of influenza from Nasopharyngeal swab specimens and should not be used as a sole basis for treatment. Nasal washings and aspirates are unacceptable for Xpert Xpress SARS-CoV-2/FLU/RSV testing.  Fact Sheet for  Patients: BloggerCourse.com  Fact Sheet for Healthcare Providers: SeriousBroker.it  This test is not yet approved or cleared by the [place] FDA and has been authorized for detection and/or diagnosis of SARS-CoV-2 by FDA under an Emergency Use Authorization (EUA). This EUA will remain in effect (meaning this test can be used) for the duration of the COVID-19 declaration under Section 564(b)(1) of the Act, 21 U.S.C. section 360bbb-3(b)(1), unless the authorization is terminated or revoked.  Performed at [facility], [address], [place], [place] [address]   Blood Culture ID Panel (Reflexed)     Status: Abnormal  Collection Time: 07/03/20  1:33 AM  Result Value Ref Range Status   Enterococcus faecalis NOT DETECTED NOT DETECTED Final   Enterococcus Faecium NOT DETECTED NOT DETECTED Final   Listeria monocytogenes NOT DETECTED NOT DETECTED Final   Staphylococcus species NOT DETECTED NOT DETECTED Final   Staphylococcus aureus (BCID) NOT DETECTED NOT DETECTED Final   Staphylococcus epidermidis NOT DETECTED NOT DETECTED Final   Staphylococcus lugdunensis NOT DETECTED NOT DETECTED Final   Streptococcus species NOT DETECTED NOT DETECTED Final   Streptococcus agalactiae NOT DETECTED NOT DETECTED Final   Streptococcus pneumoniae NOT DETECTED NOT DETECTED Final   Streptococcus pyogenes NOT DETECTED NOT DETECTED Final   A.calcoaceticus-baumannii NOT DETECTED NOT DETECTED Final   Bacteroides fragilis NOT DETECTED NOT DETECTED Final   Enterobacterales NOT DETECTED NOT DETECTED Final   Enterobacter cloacae complex NOT DETECTED NOT DETECTED Final   Escherichia coli NOT DETECTED NOT DETECTED Final   Klebsiella aerogenes NOT DETECTED NOT DETECTED Final   Klebsiella oxytoca NOT DETECTED NOT DETECTED Final   Klebsiella pneumoniae NOT DETECTED NOT DETECTED Final   Proteus species NOT DETECTED NOT DETECTED Final    Salmonella species NOT DETECTED NOT DETECTED Final   Serratia marcescens NOT DETECTED NOT DETECTED Final   Haemophilus influenzae NOT DETECTED NOT DETECTED Final   Neisseria meningitidis NOT DETECTED NOT DETECTED Final   Pseudomonas aeruginosa DETECTED (A) NOT DETECTED Final    Comment: CRITICAL RESULT CALLED TO, READ BACK BY AND VERIFIED WITH: E JACKSON PHARMD 07/04/20 0252 JDW    Stenotrophomonas maltophilia NOT DETECTED NOT DETECTED Final   Candida albicans NOT DETECTED NOT DETECTED Final   Candida auris NOT DETECTED NOT DETECTED Final   Candida glabrata NOT DETECTED NOT DETECTED Final   Candida krusei NOT DETECTED NOT DETECTED Final   Candida parapsilosis NOT DETECTED NOT DETECTED Final   Candida tropicalis NOT DETECTED NOT DETECTED Final   Cryptococcus neoformans/gattii NOT DETECTED NOT DETECTED Final   CTX-M ESBL NOT DETECTED NOT DETECTED Final   Carbapenem resistance IMP NOT DETECTED NOT DETECTED Final   Carbapenem resistance KPC NOT DETECTED NOT DETECTED Final   Carbapenem resistance NDM NOT DETECTED NOT DETECTED Final   Carbapenem resistance VIM NOT DETECTED NOT DETECTED Final    Comment: Performed at [facility], [address] [place], [place] [address]  C Difficile Quick Screen w PCR reflex     Status: Abnormal   Collection Time: 07/03/20  7:01 PM   Specimen: STOOL  Result Value Ref Range Status   C Diff antigen POSITIVE (A) NEGATIVE Final   C Diff toxin NEGATIVE NEGATIVE Final   C Diff interpretation Results are indeterminate. See PCR results.  Final    Comment: Performed at [facility], [place] [address], [place], [place] [address]  Gastrointestinal Panel by PCR , Stool     Status: None   Collection Time: 07/03/20  7:01 PM   Specimen: STOOL  Result Value Ref Range Status   Campylobacter species NOT DETECTED NOT DETECTED Final   Plesimonas shigelloides NOT DETECTED NOT DETECTED Final   Salmonella species NOT DETECTED NOT DETECTED Final    Yersinia enterocolitica NOT DETECTED NOT DETECTED Final   Vibrio species NOT DETECTED NOT DETECTED Final   Vibrio cholerae NOT DETECTED NOT DETECTED Final   Enteroaggregative E coli (EAEC) NOT DETECTED NOT DETECTED Final   Enteropathogenic E coli (EPEC) NOT DETECTED NOT DETECTED Final   Enterotoxigenic E coli (ETEC) NOT DETECTED NOT DETECTED Final   Shiga like toxin  producing E coli (STEC) NOT DETECTED NOT DETECTED Final   Shigella/Enteroinvasive E coli (EIEC) NOT DETECTED NOT DETECTED Final   Cryptosporidium NOT DETECTED NOT DETECTED Final   Cyclospora cayetanensis NOT DETECTED NOT DETECTED Final   Entamoeba histolytica NOT DETECTED NOT DETECTED Final   Giardia lamblia NOT DETECTED NOT DETECTED Final   Adenovirus F40/41 NOT DETECTED NOT DETECTED Final   Astrovirus NOT DETECTED NOT DETECTED Final   Norovirus GI/GII NOT DETECTED NOT DETECTED Final   Rotavirus A NOT DETECTED NOT DETECTED Final   Sapovirus (I, II, IV, and V) NOT DETECTED NOT DETECTED Final    Comment: Performed at [facility], [address] [place], [place] [address]  C. Diff by PCR, Reflexed     Status: None   Collection Time: 07/03/20  7:01 PM  Result Value Ref Range Status   Toxigenic C. Difficile by PCR NEGATIVE NEGATIVE Final    Comment: Patient is colonized with non toxigenic C. difficile. May not need treatment unless significant symptoms are present. Performed at [facility], [place] [address] [place] [address], [place], [place] [address]   Culture, blood (routine x 2)     Status: None   Collection Time: 07/04/20  9:43 AM   Specimen: BLOOD LEFT HAND  Result Value Ref Range Status   Specimen Description   Final    BLOOD LEFT HAND Performed at [place] [address] [place], [place] [address]    Special Requests   Final    BOTTLES DRAWN AEROBIC ONLY Blood Culture adequate volume Performed at [place] [address], [place], [place] [address]    Culture   Final     NO GROWTH 5 DAYS Performed at [facility], [place] [address] [place], [place] [address]    Report Status 07/09/2020 FINAL  Final  Culture, blood (routine x 2)     Status: None   Collection Time: 07/04/20  9:43 AM   Specimen: BLOOD LEFT HAND  Result Value Ref Range Status   Specimen Description   Final    BLOOD LEFT HAND Performed at [place] [address] [place], [place] [address]    Special Requests   Final    BOTTLES DRAWN AEROBIC ONLY Blood Culture adequate volume Performed at [place] [address] [place], [place] [address]    Culture   Final    NO GROWTH 5 DAYS Performed at [facility], [place] [address], [place], [place] [address]    Report Status 07/09/2020 FINAL  Final  Culture, blood (Routine X 2) w Reflex to ID Panel     Status: None (Preliminary result)   Collection Time: 07/10/20  7:37 AM   Specimen: BLOOD  Result Value Ref Range Status   Specimen Description   Final    BLOOD RIGHT WRIST Performed at [place] [address] [place], [place] [address]    Special Requests   Final    BOTTLES DRAWN AEROBIC ONLY Blood Culture adequate volume Performed at [place] [address] [place],  [address]    Culture   Final    NO GROWTH 1 DAY Performed at [facility], [place] [address] [place],  [address]    Report Status PENDING  Incomplete  Culture, blood (Routine X 2) w Reflex to ID Panel     Status: None (Preliminary result)   Collection Time: 07/10/20  7:37 AM  Specimen: BLOOD  Result Value Ref Range Status   Specimen Description   Final    BLOOD LEFT WRIST Performed at [place] [address] [place], [place] [address]    Special Requests   Final    BOTTLES DRAWN AEROBIC ONLY Blood Culture adequate volume Performed at [place] [address], [place], [place] [address]    Culture    Final    NO GROWTH 1 DAY Performed at [facility], [place] [address], [place], [place] [address]    Report Status PENDING  Incomplete    Serology:  Imaging: If present, new imagings (plain films, ct scans, and mri) have been personally visualized and interpreted; radiology reports have been reviewed. Decision making incorporated into the Impression / Recommendations.  3/24 cxr The heart size and mediastinal contours are mildly enlarged. A right-sided MediPort catheter seen with the tip at the superior cavoatrial junction. Both lungs are clear. The visualized skeletal structures are unremarkable.  3/25 abd xray The bowel gas pattern is normal. There is no evidence of free air. No radio-opaque calculi or other significant radiographic abnormality is seen.  3/25 renal u/s 1. No obstructive uropathy. 2. Bilateral renal parenchymal thinning and increased echogenicity consistent with chronic medical renal disease. 3. Echogenic bladder wall thickening in the right bladder, corresponding to that seen on prior PET CT, at site of hypermetabolism prior PET.  3/28 u/s soft tissue chest No evidence abscess  Jabier Mutton, [facility] for [place] [phone] pager    07/11/2020, 3:40 PM

## 2020-07-12 DIAGNOSIS — N179 Acute kidney failure, unspecified: Secondary | ICD-10-CM | POA: Diagnosis not present

## 2020-07-12 DIAGNOSIS — D701 Agranulocytosis secondary to cancer chemotherapy: Secondary | ICD-10-CM | POA: Diagnosis not present

## 2020-07-12 DIAGNOSIS — R7881 Bacteremia: Secondary | ICD-10-CM | POA: Diagnosis not present

## 2020-07-12 DIAGNOSIS — D61818 Other pancytopenia: Secondary | ICD-10-CM | POA: Diagnosis not present

## 2020-07-12 LAB — CBC WITH DIFFERENTIAL/PLATELET
Abs Immature Granulocytes: 0.01 10*3/uL (ref 0.00–0.07)
Basophils Absolute: 0 10*3/uL (ref 0.0–0.1)
Basophils Relative: 0 %
Eosinophils Absolute: 0 10*3/uL (ref 0.0–0.5)
Eosinophils Relative: 0 %
HCT: 21.8 % — ABNORMAL LOW (ref 39.0–52.0)
Hemoglobin: 7.4 g/dL — ABNORMAL LOW (ref 13.0–17.0)
Immature Granulocytes: 3 %
Lymphocytes Relative: 23 %
Lymphs Abs: 0.1 10*3/uL — ABNORMAL LOW (ref 0.7–4.0)
MCH: 31 pg (ref 26.0–34.0)
MCHC: 33.9 g/dL (ref 30.0–36.0)
MCV: 91.2 fL (ref 80.0–100.0)
Monocytes Absolute: 0.1 10*3/uL (ref 0.1–1.0)
Monocytes Relative: 16 %
Neutro Abs: 0.2 10*3/uL — CL (ref 1.7–7.7)
Neutrophils Relative %: 58 %
Platelets: 15 10*3/uL — CL (ref 150–400)
RBC: 2.39 MIL/uL — ABNORMAL LOW (ref 4.22–5.81)
RDW: 15.4 % (ref 11.5–15.5)
WBC: 0.3 10*3/uL — CL (ref 4.0–10.5)
nRBC: 0 % (ref 0.0–0.2)

## 2020-07-12 LAB — COMPREHENSIVE METABOLIC PANEL
ALT: 28 U/L (ref 0–44)
AST: 19 U/L (ref 15–41)
Albumin: 1.6 g/dL — ABNORMAL LOW (ref 3.5–5.0)
Alkaline Phosphatase: 20 U/L — ABNORMAL LOW (ref 38–126)
Anion gap: 14 (ref 5–15)
BUN: 143 mg/dL — ABNORMAL HIGH (ref 8–23)
CO2: 26 mmol/L (ref 22–32)
Calcium: 7.6 mg/dL — ABNORMAL LOW (ref 8.9–10.3)
Chloride: 104 mmol/L (ref 98–111)
Creatinine, Ser: 6.77 mg/dL — ABNORMAL HIGH (ref 0.61–1.24)
GFR, Estimated: 8 mL/min — ABNORMAL LOW (ref >60)
Glucose, Bld: 188 mg/dL — ABNORMAL HIGH (ref 70–99)
Potassium: 2.9 mmol/L — ABNORMAL LOW (ref 3.5–5.1)
Sodium: 144 mmol/L (ref 135–145)
Total Bilirubin: 2 mg/dL — ABNORMAL HIGH (ref 0.3–1.2)
Total Protein: 3.9 g/dL — ABNORMAL LOW (ref 6.5–8.1)

## 2020-07-12 LAB — BPAM PLATELET PHERESIS
Blood Product Expiration Date: 202204022359
Blood Product Expiration Date: 202204042359
ISSUE DATE / TIME: 202204010945
ISSUE DATE / TIME: 202204011303
Unit Type and Rh: 5100
Unit Type and Rh: 6200

## 2020-07-12 LAB — PREPARE PLATELET PHERESIS
Unit division: 0
Unit division: 0

## 2020-07-12 LAB — PHOSPHORUS: Phosphorus: 5.4 mg/dL — ABNORMAL HIGH (ref 2.5–4.6)

## 2020-07-12 LAB — MAGNESIUM: Magnesium: 2.3 mg/dL (ref 1.7–2.4)

## 2020-07-12 MED ORDER — HYDROXYZINE HCL 50 MG/ML IM SOLN
25.0000 mg | Freq: Once | INTRAMUSCULAR | Status: AC
  Administered 2020-07-12: 25 mg via INTRAMUSCULAR
  Filled 2020-07-12: qty 0.5

## 2020-07-12 MED ORDER — SODIUM CHLORIDE 0.9 % IV SOLN: INTRAVENOUS | Status: DC

## 2020-07-12 MED ORDER — METOPROLOL TARTRATE 5 MG/5ML IV SOLN
5.0000 mg | Freq: Four times a day (QID) | INTRAVENOUS | Status: DC
  Administered 2020-07-12 – 2020-07-13 (×4): 5 mg via INTRAVENOUS
  Filled 2020-07-12 (×3): qty 5

## 2020-07-12 MED ORDER — POTASSIUM CHLORIDE 10 MEQ/100ML IV SOLN
10.0000 meq | INTRAVENOUS | Status: AC
  Administered 2020-07-12 (×6): 10 meq via INTRAVENOUS
  Filled 2020-07-12 (×3): qty 100

## 2020-07-12 NOTE — Progress Notes (Signed)
   07/11/20 2352  Assess: MEWS Score  Temp 98 F (36.7 C)  BP (!) 162/120  Pulse Rate (!) 102  Resp 20  SpO2 100 %  O2 Device Nasal Cannula  Assess: MEWS Score  MEWS Temp 0  MEWS Systolic 0  MEWS Pulse 1  MEWS RR 0  MEWS LOC 1  MEWS Score 2  MEWS Score Color Yellow  Assess: if the MEWS score is Yellow or Red  Were vital signs taken at a resting state? Yes  Focused Assessment No change from prior assessment  Early Detection of Sepsis Score *See Row Information* High  MEWS guidelines implemented *See Row Information* No, previously yellow, continue vital signs every 4 hours  Notify: Charge Nurse/RN  Name of Charge Nurse/RN Notified Jayden, RN  Date Charge Nurse/RN Notified 07/11/20  Time Charge Nurse/RN Notified 2355  Notify: Provider  Provider Name/Title Jeannette Corpus NP  Date Provider Notified 07/11/20  Time Provider Notified [phone]

## 2020-07-12 NOTE — Progress Notes (Signed)
SLP Cancellation Note  Patient Details Name: [name] Rose MRN: [id number] DOB: 1951/01/28   Cancelled treatment:       Reason Eval/Treat Not Completed: Other (comment) (From MD note, pt appears clinically worse - increased encephalopathy, will follow up Monday 4/4 if indicated per review of notes. Thanks.)  [name] Lime, MS [facility] SLP Acute Rehab Services Office [phone] Pager [phone]   [name] Rose 07/12/2020, 7:10 PM

## 2020-07-12 NOTE — Progress Notes (Addendum)
PROGRESS NOTE  [name] Rose LSL:[id number] DOB: [birth year] DOA: 07/02/2020 PCP: Shirline Frees, MD  Brief History   70 year old man PMH including bladder cancer, CKD stage V, atrial fibrillation presented with generalized weakness.  Admitted for pancytopenia.  Found to have Pseudomonas bacteremia.  Nephrology consulted for worsening renal function.  Seen by oncology for pancytopenia.  Has required multiple blood products and has received Neupogen.  Found to have nontoxigenic C. difficile and started on treatment as per infectious disease.  Infectious disease has recommended removing Port-A-Cath however neither surgery nor oncology feel this is prudent at this point.  Pancytopenia remains and prognosis remains guarded.  May not survive this hospitalization.  A & P  Pancytopenia secondary to antineoplastic therapy.   Anemia of chronic disease. Patient status post chemotherapy earlier this month.   --Continue transfusion blood products as per oncology including platelets and PRBCs. --Continue filgrastim as per oncology --Hemoglobin slightly down, platelets up, WBC marginally up.  Sepsis secondary to Pseudomonas bacteremia, left axilla cellulitis present on admission --Hemodynamics remain stable.  Continue antibiotic per infectious disease.  Atrial fibrillation with rapid ventricular response, chronic atrial fibrillation  --Remains a asymptomatic.  Heart rate controlled..  Continue amiodarone and metoprolol.  AKI superimposed on CKD stage V with metabolic acidosis, hyperphosphatemia --Nonoliguric.  Renal function without significant change today.  Acute metabolic encephalopathy secondary to uremia. --More encephalopathic today secondary to uremia.  Nontoxigenic C. difficile diarrhea present on admission --ID recommended oral vancomycin for 10 days, will complete.  Suspect diarrhea is from chemotherapy or neutropenia.  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  Obesity unspecified Body mass index is 32.89 kg/m.  Disposition Plan:  Discussion: Although platelet count WBC improved marginally today and renal function stable, patient appears clinically worse and more encephalopathic.  He is not eating very much and is failing to thrive.  I think recovery is not likely.  Discussed in detail with wife and her daughter at bedside.  I broached the concept of hospice and without significant improvement over the next 48 hours I would recommend transition to residential hospice.  I recommend against consideration of feeding tube or dialysis.  He were to worsen comfort measures would be recommended.  Status is: Inpatient  Remains inpatient appropriate because:Ongoing diagnostic testing needed not appropriate for outpatient work up, IV treatments appropriate due to intensity of illness or inability to take PO and Inpatient level of care appropriate due to severity of illness   Dispo: The patient is from: Home              Anticipated d/c is to: Home              Patient currently is not medically stable to d/c.   Difficult to place patient No  DVT prophylaxis: SCDs Start: 07/03/20 1451   Code Status: DNR Level of care: Telemetry Family Communication: wife and her daughter at bedside 4/2  [name], MD  [facility] Direct contact: see [email or web address] (further directions at bottom of note if needed) 7PM-7AM contact night coverage as at bottom of note 07/12/2020, 1:05 PM  LOS: 9 days   Significant Hospital Events   . Admit 3/23    Consults:  . Oncology  . Nephrology . ID . General surgery    Procedures:  .   Significant Diagnostic Tests:  Marland Kitchen    Micro Data:  .    Antimicrobials:  .  Cefepime   Interval History/Subjective  CC: f/u pancytopenia  More confused today.  Does not offer history.  Wife and her daughter at bedside.   He did eat fairly well yesterday but did not eat much today.  Objective   Vitals:  Vitals:   07/12/20 0453 07/12/20 1127  BP: 111/74 104/77  Pulse: (!) 111 (!) 104  Resp: 16 (!) 24  Temp: 97.6 F (36.4 C) 97.8 F (36.6 C)  SpO2: 98% 100%    Exam:  Constitutional:   . Appears confused, encephalopathic, ill but not toxic Respiratory:  . CTA bilaterally, no w/r/r.  . Respiratory effort normal.  Cardiovascular:  . RRR, no m/r/g . No LE extremity edema   Psychiatric:  . Mental status . Cannot assess mood or affect.  Skin left chest and axilla appears to be healing.  Scattered rash on the body without significant change.   I have personally reviewed the following:   Today's Data  . Potassium 3.9, BUN stable at 143, creatinine without significant change 6.77 . Anion gap slightly down to 14, total bilirubin down to 2.0 . Hemoglobin down to 7.4.  Platelets up to 15.  WBC of 2.3.  ANC 200. Marland Kitchen Urine output 1500  Scheduled Meds: . (feeding supplement) PROSource Plus  30 mL Oral TID PC & HS  . amiodarone  200 mg Oral Daily  . antiseptic oral rinse  15 mL Mouth Rinse Q2H  . chlorhexidine  15 mL Mouth/Throat QID  . chlorhexidine  15 mL Mouth Rinse BID  . Chlorhexidine Gluconate Cloth  6 each Topical Daily  . DULoxetine  60 mg Oral Daily  . Gerhardt's butt cream   Topical QID  . mouth rinse  15 mL Mouth Rinse q12n4p  . metoprolol tartrate  5 mg Intravenous Q12H  . sodium chloride flush  10-40 mL Intracatheter Q12H  . Tbo-Filgrastim  480 mcg Subcutaneous q1800  . vancomycin  125 mg Oral QID   Continuous Infusions: . sodium chloride    . ceFEPime (MAXIPIME) IV Stopped (07/11/20 1251)  . potassium chloride 10 mEq (07/12/20 1149)    Principal Problem:   Pancytopenia ([place]) Active Problems:   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 (HCC)   Thrombocytopenia (HCC)   AKI (acute kidney injury) ([place])   Bacteremia due to Pseudomonas   Essential  hypertension   Chronic atrial fibrillation (HCC)   Neutropenia (HCC)   CKD (chronic kidney disease) stage 5, GFR less than 15 ml/min (HCC)   Weakness generalized   Anemia of chronic disease   Sepsis ([place])   LOS: 9 days   How to contact the [facility] Attending or Consulting provider 7A - 7P or covering provider during after hours [place], for this patient?  1. Check the care team in [facility] and look for a) attending/consulting [facility] provider listed and b) the [facility] team listed 2. Log into [email or web address] and use [place]'s universal password to access. If you do not have the password, please contact the hospital operator. 3. Locate the [facility] provider you are looking for under [facility] and page to a number that you can be directly reached. 4. If you still have difficulty reaching the provider, please page the [facility] (Director on Call) for the Hospitalists listed on amion for assistance.

## 2020-07-12 NOTE — Progress Notes (Signed)
Unfortunately, we are not seeing a lot of progress with his overall status.  However, his white cell count is now 0.3.  His platelet count is 15,000.  The hemoglobin is 7.4.  His renal function is stable.  BUN is 143 creatinine 6.77.  His potassium is 2.9.  His albumin is 1.6.  He really is not talking to me.  He is somewhat disoriented.  Again this might be just from being sick so long.  It also might be from the uremia.  His recent blood cultures were negative.  The Port-A-Cath is now accessed.  He obviously is not eating.  His liver functions are doing a little bit better.  Again it is hard to say exactly how much better he is going to get.  I still think that the white cell recovery is the best chance for him to improve.  He is still making some urine despite the incredibly elevated BUN and creatinine.  He does not look like he is hurting.  I see that he is now a DNR.  I totally agree with this.  Even if he does improve and gets out of the hospital, I just cannot imagine that his performance status is good to be all that good and I think that if he does get out of the hospital, he will definitely need Hospice.  I still feel that the weekend is going to be important so we can see how his blood counts actually recover.  He does not need any platelets today.  I think it would be reasonable to give him some potassium.  I know that he is being followed closely by ID and nephrology.  I suspect he still has the C. difficile.  He really cannot tell me how the diarrhea is going.  He is still on Neupogen to try to help with his white cell count.  His temperature is 97.6.  Pulse 111.  His blood pressure 111/74.  Oxygen saturation is 98%.  There really is no change in his overall physical exam.  He just is not as oriented.  He does appear to be somewhat confused.  Again, this is thought to be from the uremia.  The white cell recovery will clearly be the only way he is going to get through  this.  At least the white cell count is up a little bit more today.  It is still quite low.  I know that he is trying his best.  I know he is getting wonderful care from all the staff up on [address].  This is incredibly complicated and the staff are doing a wonderful job.  [name], MD  [place] 2:4

## 2020-07-12 NOTE — Progress Notes (Signed)
[facility] ROUNDING NOTE   Subjective:   Brief history.   70 year old gentleman history of bladder cancer stage V chronic kidney disease atrial fibrillation admitted with neutropenic fever pancytopenia and Pseudomonas bacteremia.  Acute on chronic kidney disease changes baseline creatinine about 6 mg/dL.  Transition to DNR status  Blood pressure 111/74 pulse 104 temperature 97.6 O2 sats 98% nasal cannula  Urine output 1300 cc 07/11/2020.  Status post transfusion 07/11/2020  Sodium 144 potassium 2.9 chloride 104 CO2 26 BUN 143 creatinine 6.77 glucose 188 phosphorus 5.4 calcium 7.6 albumin 1.6 hemoglobin 7.4    Objective:  Vital signs in last 24 hours:  Temp:  [97.6 F (36.4 C)-99.1 F (37.3 C)] 97.6 F (36.4 C) (04/02 0453) Pulse Rate:  [102-118] 111 (04/02 0453) Resp:  [16-20] 16 (04/02 0453) BP: (110-162)/(74-120) 111/74 (04/02 0453) SpO2:  [98 %-100 %] 98 % (04/02 0453) Weight:  [117.6 kg] 117.6 kg (04/02 0453)  Weight change:  Filed Weights   07/08/20 0500 07/09/20 0520 07/12/20 0453  Weight: 115.8 kg 116.2 kg 117.6 kg    Intake/Output: I/O last 3 completed shifts: In: 5600.3 [P.O.:240; I.V.:2588.8; Blood:1816.3; IV Piggyback:955.2] Out: 1975 [HCWCB:7628]   Intake/Output this shift:  No intake/output data recorded.  GEN: Chronically ill-appearing, lying in bed, nad ENT: no nasal discharge, mmm EYES: no scleral icterus, eomi CV: Tachycardic PULM: no iwob, bilateral chest rise ABD: NABS, non-distended SKIN: no rashes or jaundice EXT: no edema, warm and well perfused   Basic Metabolic Panel: Recent Labs  Lab 07/06/20 0528 07/07/20 0506 07/07/20 1325 07/08/20 0519 07/09/20 0840 07/10/20 0525 07/11/20 0455 07/12/20 0424  NA 130* 132*   < > 132* 133* 134* 140 144  K 3.9 2.8*   < > 3.1* 2.9* 2.8* 2.6* 2.9*  CL 106 102   < > 103 103 104 104 104  CO2 15* 17*   < > 16* 17* 14* 21* 26  GLUCOSE 135* 127*   < > 156* 125* 129* 177* 188*  BUN 92*  91*   < > 97* 111* 130* 144* 143*  CREATININE 6.71* 6.20*   < > 6.07* 6.32* 6.76* 6.85* 6.77*  CALCIUM 8.1* 7.6*   < > 7.7* 8.0* 7.7* 7.9* 7.6*  MG 1.9 1.7  --  1.9 1.9  --  1.8 2.3  PHOS 4.3 5.1*  --  4.6 5.5*  --   --  5.4*   < > = values in this interval not displayed.    Liver Function Tests: Recent Labs  Lab 07/08/20 0519 07/09/20 0840 07/10/20 0525 07/11/20 0455 07/12/20 0424  AST 12* 14* $Remov'19 27 19  'mneByJ$ ALT $Remo'22 22 25 'Aenti$ 35 28  ALKPHOS 27* 23* 24* 24* 20*  BILITOT 1.6* 1.8* 2.4* 2.4* 2.0*  PROT 4.8* 4.7* 4.5* 4.4* 3.9*  ALBUMIN 2.1* 2.0* 1.8* 1.9* 1.6*   No results for input(s): LIPASE, AMYLASE in the last 168 hours. No results for input(s): AMMONIA in the last 168 hours.  CBC: Recent Labs  Lab 07/06/20 0528 07/07/20 0506 07/08/20 0519 07/09/20 0836 07/10/20 0525 07/11/20 0455 07/12/20 0424  WBC <0.1*   < > 0.1* 0.1* 0.2* 0.2* 0.3*  NEUTROABS Not Measured  --   --   --   --   --  0.2*  HGB 7.6*   < > 9.3* 9.4* 8.8* 8.7* 7.4*  HCT 22.7*   < > 27.4* 27.8* 26.3* 25.4* 21.8*  MCV 97.0   < > 93.2 93.6 94.3 91.7 91.2  PLT 5*   < >  6* 7* 9* <5* 15*   < > = values in this interval not displayed.    Cardiac Enzymes: No results for input(s): CKTOTAL, CKMB, CKMBINDEX, TROPONINI in the last 168 hours.  BNP: Invalid input(s): POCBNP  CBG: No results for input(s): GLUCAP in the last 168 hours.  Microbiology: Results for orders placed or performed during the hospital encounter of 07/02/20  Blood culture (routine x 2)     Status: None   Collection Time: 07/03/20  1:28 AM   Specimen: BLOOD  Result Value Ref Range Status   Specimen Description   Final    BLOOD RIGHT ANTECUBITAL Performed at [facility] [address] [place], [place] [address]    Special Requests   Final    BOTTLES DRAWN AEROBIC AND ANAEROBIC Blood Culture results may not be optimal due to an excessive volume of blood received in culture bottles Performed at [facility] [address] [place], [place] [address]    Culture   Final    NO GROWTH 5 DAYS Performed at [facility], [place] [address], [place], [place] [address]    Report Status 07/08/2020 FINAL  Final  Blood culture (routine x 2)     Status: Abnormal   Collection Time: 07/03/20  1:33 AM   Specimen: BLOOD  Result Value Ref Range Status   Specimen Description   Final    BLOOD RIGHT HAND Performed at [facility] [address] [place], [place] [address]    Special Requests   Final    BOTTLES DRAWN AEROBIC AND ANAEROBIC Blood Culture results may not be optimal due to an excessive volume of blood received in culture bottles Performed at [facility] [address] [place], [place] [address]    Culture  Setup Time   Final    AEROBIC BOTTLE ONLY GRAM NEGATIVE RODS CRITICAL RESULT CALLED TO, READ BACK BY AND VERIFIED WITHSeleta Rhymes [facility] 07/04/20 0252 JDW Performed at [facility], [place] [address], [place], [place] [address]    Culture PSEUDOMONAS AERUGINOSA (A)  Final   Report Status 07/05/2020 FINAL  Final   Organism ID, Bacteria PSEUDOMONAS AERUGINOSA  Final      Susceptibility   Pseudomonas aeruginosa - MIC*    CEFTAZIDIME 4 SENSITIVE Sensitive     CIPROFLOXACIN <=0.25 SENSITIVE Sensitive     GENTAMICIN <=1 SENSITIVE Sensitive     IMIPENEM 2 SENSITIVE Sensitive     PIP/TAZO 8 SENSITIVE Sensitive     CEFEPIME 2 SENSITIVE Sensitive     * PSEUDOMONAS AERUGINOSA  Resp Panel by RT-PCR (Flu A&B, Covid) Nasopharyngeal Swab     Status: None   Collection Time: 07/03/20  1:33 AM   Specimen: Nasopharyngeal Swab; Nasopharyngeal(NP) swabs in vial transport medium  Result Value Ref Range Status   SARS Coronavirus 2 by RT PCR NEGATIVE NEGATIVE Final    Comment: (NOTE) SARS-CoV-2 target nucleic acids are NOT DETECTED.  The SARS-CoV-2 RNA is generally detectable in upper respiratory specimens during the acute phase of infection. The  lowest concentration of SARS-CoV-2 viral copies this assay can detect is 138 copies/mL. A negative result does not preclude SARS-Cov-2 infection and should not be used as the sole basis for treatment or other patient management decisions. A negative result may occur with  improper specimen collection/handling, submission of specimen other than nasopharyngeal swab, presence of viral mutation(s) within the areas targeted by this assay, and inadequate number of  viral copies(<138 copies/mL). A negative result must be combined with clinical observations, patient history, and epidemiological information. The expected result is Negative.  Fact Sheet for Patients:  EntrepreneurPulse.com.au  Fact Sheet for Healthcare Providers:  IncredibleEmployment.be  This test is no t yet approved or cleared by the [place] FDA and  has been authorized for detection and/or diagnosis of SARS-CoV-2 by FDA under an Emergency Use Authorization (EUA). This EUA will remain  in effect (meaning this test can be used) for the duration of the COVID-19 declaration under Section 564(b)(1) of the Act, 21 U.S.C.section 360bbb-3(b)(1), unless the authorization is terminated  or revoked sooner.       Influenza A by PCR NEGATIVE NEGATIVE Final   Influenza B by PCR NEGATIVE NEGATIVE Final    Comment: (NOTE) The Xpert Xpress SARS-CoV-2/FLU/RSV plus assay is intended as an aid in the diagnosis of influenza from Nasopharyngeal swab specimens and should not be used as a sole basis for treatment. Nasal washings and aspirates are unacceptable for Xpert Xpress SARS-CoV-2/FLU/RSV testing.  Fact Sheet for Patients: EntrepreneurPulse.com.au  Fact Sheet for Healthcare Providers: IncredibleEmployment.be  This test is not yet approved or cleared by the [place] FDA and has been authorized for detection and/or diagnosis of SARS-CoV-2 by FDA under  an Emergency Use Authorization (EUA). This EUA will remain in effect (meaning this test can be used) for the duration of the COVID-19 declaration under Section 564(b)(1) of the Act, 21 U.S.C. section 360bbb-3(b)(1), unless the authorization is terminated or revoked.  Performed at [facility], [place] [address], [place], [place] [address]   Blood Culture ID Panel (Reflexed)     Status: Abnormal   Collection Time: 07/03/20  1:33 AM  Result Value Ref Range Status   Enterococcus faecalis NOT DETECTED NOT DETECTED Final   Enterococcus Faecium NOT DETECTED NOT DETECTED Final   Listeria monocytogenes NOT DETECTED NOT DETECTED Final   Staphylococcus species NOT DETECTED NOT DETECTED Final   Staphylococcus aureus (BCID) NOT DETECTED NOT DETECTED Final   Staphylococcus epidermidis NOT DETECTED NOT DETECTED Final   Staphylococcus lugdunensis NOT DETECTED NOT DETECTED Final   Streptococcus species NOT DETECTED NOT DETECTED Final   Streptococcus agalactiae NOT DETECTED NOT DETECTED Final   Streptococcus pneumoniae NOT DETECTED NOT DETECTED Final   Streptococcus pyogenes NOT DETECTED NOT DETECTED Final   A.calcoaceticus-baumannii NOT DETECTED NOT DETECTED Final   Bacteroides fragilis NOT DETECTED NOT DETECTED Final   Enterobacterales NOT DETECTED NOT DETECTED Final   Enterobacter cloacae complex NOT DETECTED NOT DETECTED Final   Escherichia coli NOT DETECTED NOT DETECTED Final   Klebsiella aerogenes NOT DETECTED NOT DETECTED Final   Klebsiella oxytoca NOT DETECTED NOT DETECTED Final   Klebsiella pneumoniae NOT DETECTED NOT DETECTED Final   Proteus species NOT DETECTED NOT DETECTED Final   Salmonella species NOT DETECTED NOT DETECTED Final   Serratia marcescens NOT DETECTED NOT DETECTED Final   Haemophilus influenzae NOT DETECTED NOT DETECTED Final   Neisseria meningitidis NOT DETECTED NOT DETECTED Final   Pseudomonas aeruginosa DETECTED (A) NOT DETECTED Final    Comment:  CRITICAL RESULT CALLED TO, READ BACK BY AND VERIFIED WITH: E JACKSON PHARMD 07/04/20 0252 JDW    Stenotrophomonas maltophilia NOT DETECTED NOT DETECTED Final   Candida albicans NOT DETECTED NOT DETECTED Final   Candida auris NOT DETECTED NOT DETECTED Final   Candida glabrata NOT DETECTED NOT DETECTED Final   Candida krusei NOT DETECTED NOT DETECTED Final   Candida parapsilosis NOT DETECTED NOT  DETECTED Final   Candida tropicalis NOT DETECTED NOT DETECTED Final   Cryptococcus neoformans/gattii NOT DETECTED NOT DETECTED Final   CTX-M ESBL NOT DETECTED NOT DETECTED Final   Carbapenem resistance IMP NOT DETECTED NOT DETECTED Final   Carbapenem resistance KPC NOT DETECTED NOT DETECTED Final   Carbapenem resistance NDM NOT DETECTED NOT DETECTED Final   Carbapenem resistance VIM NOT DETECTED NOT DETECTED Final    Comment: Performed at [facility], [place] [address], [place], [place] [address]  C Difficile Quick Screen w PCR reflex     Status: Abnormal   Collection Time: 07/03/20  7:01 PM   Specimen: STOOL  Result Value Ref Range Status   C Diff antigen POSITIVE (A) NEGATIVE Final   C Diff toxin NEGATIVE NEGATIVE Final   C Diff interpretation Results are indeterminate. See PCR results.  Final    Comment: Performed at [facility], [place] [address], [place], [place] [address]  Gastrointestinal Panel by PCR , Stool     Status: None   Collection Time: 07/03/20  7:01 PM   Specimen: STOOL  Result Value Ref Range Status   Campylobacter species NOT DETECTED NOT DETECTED Final   Plesimonas shigelloides NOT DETECTED NOT DETECTED Final   Salmonella species NOT DETECTED NOT DETECTED Final   Yersinia enterocolitica NOT DETECTED NOT DETECTED Final   Vibrio species NOT DETECTED NOT DETECTED Final   Vibrio cholerae NOT DETECTED NOT DETECTED Final   Enteroaggregative E coli (EAEC) NOT DETECTED NOT DETECTED Final   Enteropathogenic E coli (EPEC) NOT DETECTED NOT DETECTED Final    Enterotoxigenic E coli (ETEC) NOT DETECTED NOT DETECTED Final   Shiga like toxin producing E coli (STEC) NOT DETECTED NOT DETECTED Final   Shigella/Enteroinvasive E coli (EIEC) NOT DETECTED NOT DETECTED Final   Cryptosporidium NOT DETECTED NOT DETECTED Final   Cyclospora cayetanensis NOT DETECTED NOT DETECTED Final   Entamoeba histolytica NOT DETECTED NOT DETECTED Final   Giardia lamblia NOT DETECTED NOT DETECTED Final   Adenovirus F40/41 NOT DETECTED NOT DETECTED Final   Astrovirus NOT DETECTED NOT DETECTED Final   Norovirus GI/GII NOT DETECTED NOT DETECTED Final   Rotavirus A NOT DETECTED NOT DETECTED Final   Sapovirus (I, II, IV, and V) NOT DETECTED NOT DETECTED Final    Comment: Performed at [facility], [place] [place], [place] [address]  C. Diff by PCR, Reflexed     Status: None   Collection Time: 07/03/20  7:01 PM  Result Value Ref Range Status   Toxigenic C. Difficile by PCR NEGATIVE NEGATIVE Final    Comment: Patient is colonized with non toxigenic C. difficile. May not need treatment unless significant symptoms are present. Performed at [facility], [place] [address] [place], [place] [address]   Culture, blood (routine x 2)     Status: None   Collection Time: 07/04/20  9:43 AM   Specimen: BLOOD LEFT HAND  Result Value Ref Range Status   Specimen Description   Final    BLOOD LEFT HAND Performed at [facility] [address], [place], [place] [address]    Special Requests   Final    BOTTLES DRAWN AEROBIC ONLY Blood Culture adequate volume Performed at [facility] [address], [place], [place] [address]    Culture   Final    NO GROWTH 5 DAYS Performed at [facility], [place] [address] [place],  [address]    Report  Status 07/09/2020 FINAL  Final  Culture, blood (routine x 2)     Status: None   Collection Time: 07/04/20  9:43 AM   Specimen: BLOOD LEFT HAND  Result Value Ref Range Status    Specimen Description   Final    BLOOD LEFT HAND Performed at [facility] [address], [place], [place] [address]    Special Requests   Final    BOTTLES DRAWN AEROBIC ONLY Blood Culture adequate volume Performed at [facility] [address] [place], [place] [address]    Culture   Final    NO GROWTH 5 DAYS Performed at [facility], [place] [address], [place], [place] [address]    Report Status 07/09/2020 FINAL  Final  Culture, blood (Routine X 2) w Reflex to ID Panel     Status: None (Preliminary result)   Collection Time: 07/10/20  7:37 AM   Specimen: BLOOD  Result Value Ref Range Status   Specimen Description   Final    BLOOD RIGHT WRIST Performed at [facility] [address], [place], [place] [address]    Special Requests   Final    BOTTLES DRAWN AEROBIC ONLY Blood Culture adequate volume Performed at [facility] [address], [place], [place] [address]    Culture   Final    NO GROWTH 2 DAYS Performed at [facility] [address] [place], [place] [address]    Report Status PENDING  Incomplete  Culture, blood (Routine X 2) w Reflex to ID Panel     Status: None (Preliminary result)   Collection Time: 07/10/20  7:37 AM   Specimen: BLOOD  Result Value Ref Range Status   Specimen Description   Final    BLOOD LEFT WRIST Performed at [facility] [address], [place], [place] [address]    Special Requests   Final    BOTTLES DRAWN AEROBIC ONLY Blood Culture adequate volume Performed at [name] [address] [place], [place] [address]    Culture   Final    NO GROWTH 2 DAYS Performed at [facility] [address], [place], [place] [address]    Report Status PENDING  Incomplete    Coagulation Studies: No results for input(s): LABPROT, INR in the last 72 hours.  Urinalysis: No results for input(s): COLORURINE,  LABSPEC, PHURINE, GLUCOSEU, HGBUR, BILIRUBINUR, KETONESUR, PROTEINUR, UROBILINOGEN, NITRITE, LEUKOCYTESUR in the last 72 hours.  Invalid input(s): APPERANCEUR    Imaging: No results found.   Medications:   . ceFEPime (MAXIPIME) IV Stopped (07/11/20 1251)  . potassium chloride 10 mEq (07/12/20 1015)  . sodium bicarbonate (isotonic) 150 mEq in D5W 1000 mL infusion 75 mL/hr at 07/12/20 1003   . (feeding supplement) PROSource Plus  30 mL Oral TID PC & HS  . amiodarone  200 mg Oral Daily  . antiseptic oral rinse  15 mL Mouth Rinse Q2H  . chlorhexidine  15 mL Mouth/Throat QID  . chlorhexidine  15 mL Mouth Rinse BID  . Chlorhexidine Gluconate Cloth  6 each Topical Daily  . DULoxetine  60 mg Oral Daily  . Gerhardt's butt cream   Topical QID  . mouth rinse  15 mL Mouth Rinse q12n4p  . metoprolol tartrate  5 mg Intravenous Q12H  . sodium chloride flush  10-40 mL Intracatheter Q12H  . Tbo-Filgrastim  480 mcg Subcutaneous q1800  . vancomycin  125 mg  Oral QID   acetaminophen **OR** acetaminophen, ondansetron **OR** ondansetron (ZOFRAN) IV, sodium chloride flush  Assessment/ Plan:  1. CKD V - baseline creat 5.8- 6.2 and creatinine has stayed near this baseline since admission.  Possibly slight worsening related to sepsis.  Patient follows with Dr. Royce Macadamia outpatient.  Plan is to initiate dialysis when needed. AVF creation was delayed due to acute illness.  Patient now transition to DNR status. 2. Diarrhea/Dehydration. Diarrhea mgmt per primary.   .  Encourage oral hydration.  Consider stopping fluids if kidney function is stable today. 3. Non-Anion Gap Metabolic acidosis -improved with IV sodium bicarbonate will change to normal saline 4. Neutropenic fevers - +pseudomonas bacteremia. Per pmd, on IV abx and IV granix.  Holding off on port removal for now. 5. Bladder cancer - per ONC, has been getting active chemoRx 6. Atrial fib - Slightly tachy but stable. Mgmt per primary 7. Pancytopenia -  per primary, transfusions PRN.  8. Severe Hypokalemia: We will replete with IV potassium 10 mEq x 6 rounds     LOS: 9 Sherril Croon $RemoveBefor'@TODAY'qCUFrxcDWbCF$ $Rem'@10'ydIe$ :55 AM

## 2020-07-12 NOTE — Progress Notes (Signed)
   07/11/20 2013  Assess: MEWS Score  Temp 99.1 F (37.3 C)  BP (!) 124/95  Pulse Rate (!) 118  Resp 18  SpO2 100 %  O2 Device Nasal Cannula  Assess: MEWS Score  MEWS Temp 0  MEWS Systolic 0  MEWS Pulse 2  MEWS RR 0  MEWS LOC 1  MEWS Score 3  MEWS Score Color Yellow  Assess: if the MEWS score is Yellow or Red  Were vital signs taken at a resting state? Yes  Focused Assessment No change from prior assessment  Early Detection of Sepsis Score *See Row Information* High  MEWS guidelines implemented *See Row Information* No, previously yellow, continue vital signs every 4 hours  Notify: Charge Nurse/RN  Name of Charge Nurse/RN Notified Jayden, RN  Date Charge Nurse/RN Notified 07/12/20  Time Charge Nurse/RN Notified 2015  Notify: Provider  Provider Name/Title Jeannette Corpus NP  Date Provider Notified 07/12/20  Time Provider Notified [phone]

## 2020-07-13 DIAGNOSIS — R7881 Bacteremia: Secondary | ICD-10-CM | POA: Diagnosis not present

## 2020-07-13 DIAGNOSIS — D701 Agranulocytosis secondary to cancer chemotherapy: Secondary | ICD-10-CM | POA: Diagnosis not present

## 2020-07-13 DIAGNOSIS — D61818 Other pancytopenia: Secondary | ICD-10-CM | POA: Diagnosis not present

## 2020-07-13 DIAGNOSIS — N179 Acute kidney failure, unspecified: Secondary | ICD-10-CM | POA: Diagnosis not present

## 2020-07-13 LAB — CBC WITH DIFFERENTIAL/PLATELET
Abs Immature Granulocytes: 0.01 10*3/uL (ref 0.00–0.07)
Basophils Absolute: 0 10*3/uL (ref 0.0–0.1)
Basophils Relative: 0 %
Eosinophils Absolute: 0 10*3/uL (ref 0.0–0.5)
Eosinophils Relative: 0 %
HCT: 24.1 % — ABNORMAL LOW (ref 39.0–52.0)
Hemoglobin: 8 g/dL — ABNORMAL LOW (ref 13.0–17.0)
Immature Granulocytes: 2 %
Lymphocytes Relative: 30 %
Lymphs Abs: 0.2 10*3/uL — ABNORMAL LOW (ref 0.7–4.0)
MCH: 30.9 pg (ref 26.0–34.0)
MCHC: 33.2 g/dL (ref 30.0–36.0)
MCV: 93.1 fL (ref 80.0–100.0)
Monocytes Absolute: 0.1 10*3/uL (ref 0.1–1.0)
Monocytes Relative: 13 %
Neutro Abs: 0.3 10*3/uL — CL (ref 1.7–7.7)
Neutrophils Relative %: 55 %
Platelets: 8 10*3/uL — CL (ref 150–400)
RBC: 2.59 MIL/uL — ABNORMAL LOW (ref 4.22–5.81)
RDW: 15.7 % — ABNORMAL HIGH (ref 11.5–15.5)
WBC: 0.6 10*3/uL — CL (ref 4.0–10.5)
nRBC: 0 % (ref 0.0–0.2)

## 2020-07-13 LAB — COMPREHENSIVE METABOLIC PANEL
ALT: 26 U/L (ref 0–44)
AST: 18 U/L (ref 15–41)
Albumin: 1.5 g/dL — ABNORMAL LOW (ref 3.5–5.0)
Alkaline Phosphatase: 23 U/L — ABNORMAL LOW (ref 38–126)
Anion gap: 14 (ref 5–15)
BUN: 158 mg/dL — ABNORMAL HIGH (ref 8–23)
CO2: 27 mmol/L (ref 22–32)
Calcium: 7.9 mg/dL — ABNORMAL LOW (ref 8.9–10.3)
Chloride: 106 mmol/L (ref 98–111)
Creatinine, Ser: 7.34 mg/dL — ABNORMAL HIGH (ref 0.61–1.24)
Glucose, Bld: 111 mg/dL — ABNORMAL HIGH (ref 70–99)
Potassium: 3.3 mmol/L — ABNORMAL LOW (ref 3.5–5.1)
Sodium: 147 mmol/L — ABNORMAL HIGH (ref 135–145)
Total Bilirubin: 2.2 mg/dL — ABNORMAL HIGH (ref 0.3–1.2)
Total Protein: 4.2 g/dL — ABNORMAL LOW (ref 6.5–8.1)

## 2020-07-13 MED ORDER — POTASSIUM CHLORIDE 10 MEQ/100ML IV SOLN
10.0000 meq | INTRAVENOUS | Status: AC
  Administered 2020-07-13 (×4): 10 meq via INTRAVENOUS
  Filled 2020-07-13 (×3): qty 100

## 2020-07-13 MED ORDER — POTASSIUM CL IN DEXTROSE 5% 20 MEQ/L IV SOLN
20.0000 meq | INTRAVENOUS | Status: DC
  Administered 2020-07-13 – 2020-07-14 (×2): 20 meq via INTRAVENOUS
  Filled 2020-07-13 (×3): qty 1000

## 2020-07-13 MED ORDER — METOPROLOL TARTRATE 5 MG/5ML IV SOLN
10.0000 mg | Freq: Four times a day (QID) | INTRAVENOUS | Status: DC
  Administered 2020-07-13 – 2020-07-14 (×3): 10 mg via INTRAVENOUS
  Filled 2020-07-13 (×3): qty 10

## 2020-07-13 NOTE — TOC Progression Note (Signed)
Transition of Care [facility]) - Progression Note    Patient Details  Name: [name] Rose MRN: [id number] Date of Birth: [birth year]  Transition of Care [facility]) CM/SW [place], [place] Phone Number: [phone] 07/13/2020, 1:40 PM  Clinical Narrative:     CSW contacted [facility] [place] [phone] (weekday), [phone] (weekend), [phone] (call center) and made hospice referral for the patient. Family has requested to speak with someone from [place].       Expected Discharge Plan and Services                                                 Social Determinants of Health (SDOH) Interventions    Readmission Risk Interventions Readmission Risk Prevention Plan 02/22/2020 09/26/2019 09/08/2018  Transportation Screening Complete Complete Complete  PCP or Specialist Appt within 3-5 Days - Complete -  HRI or [place] - Complete -  Social Work Consult for [place] Planning/Counseling - Complete -  Palliative Care Screening - Not Applicable -  Medication Review Press photographer) Complete Complete Complete  PCP or Specialist appointment within 3-5 days of discharge Complete - Not Complete  PCP/Specialist Appt Not Complete comments - - not yet ready for d/c  HRI or Home Care Consult Complete - Not Complete  HRI or Home Care Consult Pt Refusal Comments - - no needs identified at this time  SW Recovery Care/Counseling Consult Complete - Not Complete  SW Consult Not Complete Comments - - no needs identified at this time  Palliative Care Screening Not Applicable - Not [place] Not Applicable - Not Applicable  Some recent data might be hidden

## 2020-07-13 NOTE — Progress Notes (Signed)
TC to pt wife Threasa Beards.  Discussed hospice services. They are interested in patient going to [facility] in [facility]. Discussed with her IPU criteria. No bed available to offer today.  Our liaison will review case in the morning to determine if pt meets IPU criteria. Feel free to call with any questions at [phone]. Lin Landsman, RN BSN [place].

## 2020-07-13 NOTE — Progress Notes (Signed)
Pharmacy Antibiotic Note  [name] Rose is a 70 y.o. male with hx bladder cancer and CKD presented the ED on 07/02/2020 with c/o generalized weakness.  He's currently on cefepime for PsA bacteremia. ID recom. to treat with cefepime thru 07/18/20 (14 days).  Today, 07/13/2020: - afeb, wbc low - ANC 0.3 - scr 7.34 (crcl~13)  Plan: - continue cefepime 2gm IV q24h - monitor renal function closely  ______________________________________  Height: $Remove'6\' 2"'Hwelfns$  (188 cm) Weight: 118.8 kg (261 lb 14.5 oz) IBW/kg (Calculated) : 82.2  Temp (24hrs), Avg:97.9 F (36.6 C), Min:97.7 F (36.5 C), Max:98.1 F (36.7 C)  Recent Labs  Lab 07/09/20 0836 07/09/20 0840 07/10/20 0525 07/11/20 0455 07/12/20 0424 07/13/20 0527  WBC 0.1*  --  0.2* 0.2* 0.3* 0.6*  CREATININE  --  6.32* 6.76* 6.85* 6.77* 7.34*    Estimated Creatinine Clearance: 13.2 mL/min (A) (by C-G formula based on SCr of 7.34 mg/dL (H)).    No Known Allergies   Antimicrobials this admission:  3/24 cefepime >>  3/25 cipro >> 3/26 3/27 PO vanc >> (4/5)    Microbiology results: 3/24 C diff PCR screen: antigen (+) toxin (-), neg by PCR 3/24 Resp panel: COVID negative, Influenza A/B negative  3/24 BCx: 1/4 PsA (pan-sensitive) 3/24 GI panel PCR: neg 3/25 BCx: NGF 3/31 BCx x2:   Thank you for allowing pharmacy to be a part of this patient's care.  Lynelle Doctor 07/13/2020 10:38 AM

## 2020-07-13 NOTE — TOC Progression Note (Signed)
Transition of Care [facility]) - Progression Note    Patient Details  Name: [name] Rose MRN: [id number] Date of Birth: 02/01/1951  Transition of Care [facility]) CM/SW [place], [place] Phone Number: [phone] 07/13/2020, 3:53 PM  Clinical Narrative:     CSW received call from [id number] at [facility].  CSW gave Tharon Aquas RN patient's [place],Melanie (Spouse) [phone], contact information and updated her on family requests for hospice information.  Tharon Aquas stated she would contact Ms. Erno       Expected Discharge Plan and Services                                                 Social Determinants of Health (SDOH) Interventions    Readmission Risk Interventions Readmission Risk Prevention Plan 02/22/2020 09/26/2019 09/08/2018  Transportation Screening Complete Complete Complete  PCP or Specialist Appt within 3-5 Days - Complete -  HRI or [place] - Complete -  Social Work Consult for [place] Planning/Counseling - Complete -  Palliative Care Screening - Not Applicable -  Medication Review Press photographer) Complete Complete Complete  PCP or Specialist appointment within 3-5 days of discharge Complete - Not Complete  PCP/Specialist Appt Not Complete comments - - not yet ready for d/c  HRI or Home Care Consult Complete - Not Complete  HRI or Home Care Consult Pt Refusal Comments - - no needs identified at this time  SW Recovery Care/Counseling Consult Complete - Not Complete  SW Consult Not Complete Comments - - no needs identified at this time  Palliative Care Screening Not Applicable - Not [place] Not Applicable - Not Applicable  Some recent data might be hidden

## 2020-07-13 NOTE — Progress Notes (Addendum)
[facility] ROUNDING NOTE   Subjective:   Brief history.   70 year old gentleman history of bladder cancer stage V chronic kidney disease atrial fibrillation admitted with neutropenic fever pancytopenia and Pseudomonas bacteremia.  Acute on chronic kidney disease changes baseline creatinine about 6 mg/dL.  Transition to DNR status  Blood pressure 147/97 pulse 100 temperature 97.8 O2 sats 100% on nasal cannula  Urine output 600 cc 07/12/2020.  Status post transfusion 07/11/2020  Sodium 147 potassium 3.3 chloride 106 CO2 27 BUN 158 creatinine 7.34 glucose 111 calcium 7.9 albumin 1.5 hemoglobin 8.0    Objective:  Vital signs in last 24 hours:  Temp:  [97.7 F (36.5 C)-98.1 F (36.7 C)] 97.8 F (36.6 C) (04/03 0706) Pulse Rate:  [77-120] 103 (04/03 0706) Resp:  [15-24] 15 (04/03 0706) BP: (100-147)/(65-97) 147/97 (04/03 0706) SpO2:  [93 %-100 %] 100 % (04/03 0706) Weight:  [118.8 kg] 118.8 kg (04/03 0500)  Weight change: 1.2 kg Filed Weights   07/09/20 0520 07/12/20 0453 07/13/20 0500  Weight: 116.2 kg 117.6 kg 118.8 kg    Intake/Output: I/O last 3 completed shifts: In: 2149.5 [I.V.:1594.4; IV Piggyback:555.2] Out: 2000 [Urine:2000]   Intake/Output this shift:  No intake/output data recorded.  GEN: Chronically ill-appearing, lying in bed, nad ENT: no nasal discharge, mmm EYES: no scleral icterus, eomi CV: Tachycardic PULM: no iwob, bilateral chest rise ABD: NABS, non-distended SKIN: no rashes or jaundice EXT: no edema, warm and well perfused   Basic Metabolic Panel: Recent Labs  Lab 07/07/20 0506 07/07/20 1325 07/08/20 0519 07/09/20 0840 07/10/20 0525 07/11/20 0455 07/12/20 0424 07/13/20 0527  NA 132*   < > 132* 133* 134* 140 144 147*  K 2.8*   < > 3.1* 2.9* 2.8* 2.6* 2.9* 3.3*  CL 102   < > 103 103 104 104 104 106  CO2 17*   < > 16* 17* 14* 21* 26 27  GLUCOSE 127*   < > 156* 125* 129* 177* 188* 111*  BUN 91*   < > 97* 111* 130* 144* 143*  158*  CREATININE 6.20*   < > 6.07* 6.32* 6.76* 6.85* 6.77* 7.34*  CALCIUM 7.6*   < > 7.7* 8.0* 7.7* 7.9* 7.6* 7.9*  MG 1.7  --  1.9 1.9  --  1.8 2.3  --   PHOS 5.1*  --  4.6 5.5*  --   --  5.4*  --    < > = values in this interval not displayed.    Liver Function Tests: Recent Labs  Lab 07/09/20 0840 07/10/20 0525 07/11/20 0455 07/12/20 0424 07/13/20 0527  AST 14* 19 27 19 18   ALT 22 25 35 28 26  ALKPHOS 23* 24* 24* 20* 23*  BILITOT 1.8* 2.4* 2.4* 2.0* 2.2*  PROT 4.7* 4.5* 4.4* 3.9* 4.2*  ALBUMIN 2.0* 1.8* 1.9* 1.6* 1.5*   No results for input(s): LIPASE, AMYLASE in the last 168 hours. No results for input(s): AMMONIA in the last 168 hours.  CBC: Recent Labs  Lab 07/09/20 0836 07/10/20 0525 07/11/20 0455 07/12/20 0424 07/13/20 0527  WBC 0.1* 0.2* 0.2* 0.3* 0.6*  NEUTROABS  --   --   --  0.2* 0.3*  HGB 9.4* 8.8* 8.7* 7.4* 8.0*  HCT 27.8* 26.3* 25.4* 21.8* 24.1*  MCV 93.6 94.3 91.7 91.2 93.1  PLT 7* 9* <5* 15* 8*    Cardiac Enzymes: No results for input(s): CKTOTAL, CKMB, CKMBINDEX, TROPONINI in the last 168 hours.  BNP: Invalid input(s): POCBNP  CBG:  No results for input(s): GLUCAP in the last 168 hours.  Microbiology: Results for orders placed or performed during the hospital encounter of 07/02/20  Blood culture (routine x 2)     Status: None   Collection Time: 07/03/20  1:28 AM   Specimen: BLOOD  Result Value Ref Range Status   Specimen Description   Final    BLOOD RIGHT ANTECUBITAL Performed at [facility] [address], [place], [place] [address]    Special Requests   Final    BOTTLES DRAWN AEROBIC AND ANAEROBIC Blood Culture results may not be optimal due to an excessive volume of blood received in culture bottles Performed at [facility] [address] [place], [place] [address]    Culture   Final    NO GROWTH 5 DAYS Performed at [facility], [place] [address] [place], [place] [address]     Report Status 07/08/2020 FINAL  Final  Blood culture (routine x 2)     Status: Abnormal   Collection Time: 07/03/20  1:33 AM   Specimen: BLOOD  Result Value Ref Range Status   Specimen Description   Final    BLOOD RIGHT HAND Performed at [facility] [address], [place], [place] [address]    Special Requests   Final    BOTTLES DRAWN AEROBIC AND ANAEROBIC Blood Culture results may not be optimal due to an excessive volume of blood received in culture bottles Performed at [facility] [address] [place], [place] [address]    Culture  Setup Time   Final    AEROBIC BOTTLE ONLY GRAM NEGATIVE RODS CRITICAL RESULT CALLED TO, READ BACK BY AND VERIFIED WITHSeleta Rhymes [facility] 07/04/20 0252 JDW Performed at [facility], [place] [address] [place], [place] [address]    Culture PSEUDOMONAS AERUGINOSA (A)  Final   Report Status 07/05/2020 FINAL  Final   Organism ID, Bacteria PSEUDOMONAS AERUGINOSA  Final      Susceptibility   Pseudomonas aeruginosa - MIC*    CEFTAZIDIME 4 SENSITIVE Sensitive     CIPROFLOXACIN <=0.25 SENSITIVE Sensitive     GENTAMICIN <=1 SENSITIVE Sensitive     IMIPENEM 2 SENSITIVE Sensitive     PIP/TAZO 8 SENSITIVE Sensitive     CEFEPIME 2 SENSITIVE Sensitive     * PSEUDOMONAS AERUGINOSA  Resp Panel by RT-PCR (Flu A&B, Covid) Nasopharyngeal Swab     Status: None   Collection Time: 07/03/20  1:33 AM   Specimen: Nasopharyngeal Swab; Nasopharyngeal(NP) swabs in vial transport medium  Result Value Ref Range Status   SARS Coronavirus 2 by RT PCR NEGATIVE NEGATIVE Final    Comment: (NOTE) SARS-CoV-2 target nucleic acids are NOT DETECTED.  The SARS-CoV-2 RNA is generally detectable in upper respiratory specimens during the acute phase of infection. The lowest concentration of SARS-CoV-2 viral copies this assay can detect is 138 copies/mL. A negative result does not preclude SARS-Cov-2 infection and should not be used as the  sole basis for treatment or other patient management decisions. A negative result may occur with  improper specimen collection/handling, submission of specimen other than nasopharyngeal swab, presence of viral mutation(s) within the areas targeted by this assay, and inadequate number of viral copies(<138 copies/mL). A negative result must be combined with clinical observations, patient history, and epidemiological information. The expected result is Negative.  Fact Sheet for Patients:  EntrepreneurPulse.com.au  Fact Sheet for Healthcare Providers:  IncredibleEmployment.be  This test is  no t yet approved or cleared by the [place] and  has been authorized for detection and/or diagnosis of SARS-CoV-2 by FDA under an Emergency Use Authorization (EUA). This EUA will remain  in effect (meaning this test can be used) for the duration of the COVID-19 declaration under Section 564(b)(1) of the Act, 21 U.S.C.section 360bbb-3(b)(1), unless the authorization is terminated  or revoked sooner.       Influenza A by PCR NEGATIVE NEGATIVE Final   Influenza B by PCR NEGATIVE NEGATIVE Final    Comment: (NOTE) The Xpert Xpress SARS-CoV-2/FLU/RSV plus assay is intended as an aid in the diagnosis of influenza from Nasopharyngeal swab specimens and should not be used as a sole basis for treatment. Nasal washings and aspirates are unacceptable for Xpert Xpress SARS-CoV-2/FLU/RSV testing.  Fact Sheet for Patients: BloggerCourse.com  Fact Sheet for Healthcare Providers: SeriousBroker.it  This test is not yet approved or cleared by the [place] FDA and has been authorized for detection and/or diagnosis of SARS-CoV-2 by FDA under an Emergency Use Authorization (EUA). This EUA will remain in effect (meaning this test can be used) for the duration of the COVID-19 declaration under Section 564(b)(1) of the  Act, 21 U.S.C. section 360bbb-3(b)(1), unless the authorization is terminated or revoked.  Performed at [facility], [address] [place], [place] [address]   Blood Culture ID Panel (Reflexed)     Status: Abnormal   Collection Time: 07/03/20  1:33 AM  Result Value Ref Range Status   Enterococcus faecalis NOT DETECTED NOT DETECTED Final   Enterococcus Faecium NOT DETECTED NOT DETECTED Final   Listeria monocytogenes NOT DETECTED NOT DETECTED Final   Staphylococcus species NOT DETECTED NOT DETECTED Final   Staphylococcus aureus (BCID) NOT DETECTED NOT DETECTED Final   Staphylococcus epidermidis NOT DETECTED NOT DETECTED Final   Staphylococcus lugdunensis NOT DETECTED NOT DETECTED Final   Streptococcus species NOT DETECTED NOT DETECTED Final   Streptococcus agalactiae NOT DETECTED NOT DETECTED Final   Streptococcus pneumoniae NOT DETECTED NOT DETECTED Final   Streptococcus pyogenes NOT DETECTED NOT DETECTED Final   A.calcoaceticus-baumannii NOT DETECTED NOT DETECTED Final   Bacteroides fragilis NOT DETECTED NOT DETECTED Final   Enterobacterales NOT DETECTED NOT DETECTED Final   Enterobacter cloacae complex NOT DETECTED NOT DETECTED Final   Escherichia coli NOT DETECTED NOT DETECTED Final   Klebsiella aerogenes NOT DETECTED NOT DETECTED Final   Klebsiella oxytoca NOT DETECTED NOT DETECTED Final   Klebsiella pneumoniae NOT DETECTED NOT DETECTED Final   Proteus species NOT DETECTED NOT DETECTED Final   Salmonella species NOT DETECTED NOT DETECTED Final   Serratia marcescens NOT DETECTED NOT DETECTED Final   Haemophilus influenzae NOT DETECTED NOT DETECTED Final   Neisseria meningitidis NOT DETECTED NOT DETECTED Final   Pseudomonas aeruginosa DETECTED (A) NOT DETECTED Final    Comment: CRITICAL RESULT CALLED TO, READ BACK BY AND VERIFIED WITH: E JACKSON PHARMD 07/04/20 0252 JDW    Stenotrophomonas maltophilia NOT DETECTED NOT DETECTED Final   Candida albicans NOT  DETECTED NOT DETECTED Final   Candida auris NOT DETECTED NOT DETECTED Final   Candida glabrata NOT DETECTED NOT DETECTED Final   Candida krusei NOT DETECTED NOT DETECTED Final   Candida parapsilosis NOT DETECTED NOT DETECTED Final   Candida tropicalis NOT DETECTED NOT DETECTED Final   Cryptococcus neoformans/gattii NOT DETECTED NOT DETECTED Final   CTX-M ESBL NOT DETECTED NOT DETECTED Final   Carbapenem resistance IMP NOT DETECTED NOT DETECTED Final  Carbapenem resistance KPC NOT DETECTED NOT DETECTED Final   Carbapenem resistance NDM NOT DETECTED NOT DETECTED Final   Carbapenem resistance VIM NOT DETECTED NOT DETECTED Final    Comment: Performed at [facility], [place] [address], [place], [place] [address]  C Difficile Quick Screen w PCR reflex     Status: Abnormal   Collection Time: 07/03/20  7:01 PM   Specimen: STOOL  Result Value Ref Range Status   C Diff antigen POSITIVE (A) NEGATIVE Final   C Diff toxin NEGATIVE NEGATIVE Final   C Diff interpretation Results are indeterminate. See PCR results.  Final    Comment: Performed at [facility], [place] [address], [place], [place] [address]  Gastrointestinal Panel by PCR , Stool     Status: None   Collection Time: 07/03/20  7:01 PM   Specimen: STOOL  Result Value Ref Range Status   Campylobacter species NOT DETECTED NOT DETECTED Final   Plesimonas shigelloides NOT DETECTED NOT DETECTED Final   Salmonella species NOT DETECTED NOT DETECTED Final   Yersinia enterocolitica NOT DETECTED NOT DETECTED Final   Vibrio species NOT DETECTED NOT DETECTED Final   Vibrio cholerae NOT DETECTED NOT DETECTED Final   Enteroaggregative E coli (EAEC) NOT DETECTED NOT DETECTED Final   Enteropathogenic E coli (EPEC) NOT DETECTED NOT DETECTED Final   Enterotoxigenic E coli (ETEC) NOT DETECTED NOT DETECTED Final   Shiga like toxin producing E coli (STEC) NOT DETECTED NOT DETECTED Final   Shigella/Enteroinvasive E coli (EIEC) NOT  DETECTED NOT DETECTED Final   Cryptosporidium NOT DETECTED NOT DETECTED Final   Cyclospora cayetanensis NOT DETECTED NOT DETECTED Final   Entamoeba histolytica NOT DETECTED NOT DETECTED Final   Giardia lamblia NOT DETECTED NOT DETECTED Final   Adenovirus F40/41 NOT DETECTED NOT DETECTED Final   Astrovirus NOT DETECTED NOT DETECTED Final   Norovirus GI/GII NOT DETECTED NOT DETECTED Final   Rotavirus A NOT DETECTED NOT DETECTED Final   Sapovirus (I, II, IV, and V) NOT DETECTED NOT DETECTED Final    Comment: Performed at [facility], [place] [place], [place] [address]  C. Diff by PCR, Reflexed     Status: None   Collection Time: 07/03/20  7:01 PM  Result Value Ref Range Status   Toxigenic C. Difficile by PCR NEGATIVE NEGATIVE Final    Comment: Patient is colonized with non toxigenic C. difficile. May not need treatment unless significant symptoms are present. Performed at [facility], [place] [address], [place], [place] [address]   Culture, blood (routine x 2)     Status: None   Collection Time: 07/04/20  9:43 AM   Specimen: BLOOD LEFT HAND  Result Value Ref Range Status   Specimen Description   Final    BLOOD LEFT HAND Performed at [facility] [address] [place], [place] [address]    Special Requests   Final    BOTTLES DRAWN AEROBIC ONLY Blood Culture adequate volume Performed at [facility] [address], [place], [place] [address]    Culture   Final    NO GROWTH 5 DAYS Performed at [facility], [place] [address] [place], [place] [address]    Report Status 07/09/2020 FINAL  Final  Culture, blood (routine x 2)     Status: None   Collection Time: 07/04/20  9:43 AM   Specimen: BLOOD LEFT HAND  Result Value Ref Range Status   Specimen Description  Final    BLOOD LEFT HAND Performed at [facility], [place] [address] [place], [place] [address]    Special Requests   Final    BOTTLES  DRAWN AEROBIC ONLY Blood Culture adequate volume Performed at [facility] [address] [place], [place] [address]    Culture   Final    NO GROWTH 5 DAYS Performed at [facility], [place] [address], [place], [place] [address]    Report Status 07/09/2020 FINAL  Final  Culture, blood (Routine X 2) w Reflex to ID Panel     Status: None (Preliminary result)   Collection Time: 07/10/20  7:37 AM   Specimen: BLOOD  Result Value Ref Range Status   Specimen Description   Final    BLOOD RIGHT WRIST Performed at [facility] [address], [place], [place] [address]    Special Requests   Final    BOTTLES DRAWN AEROBIC ONLY Blood Culture adequate volume Performed at [facility] [address], [place], [place] [address]    Culture   Final    NO GROWTH 2 DAYS Performed at [facility] [address], [place], [place] [address]    Report Status PENDING  Incomplete  Culture, blood (Routine X 2) w Reflex to ID Panel     Status: None (Preliminary result)   Collection Time: 07/10/20  7:37 AM   Specimen: BLOOD  Result Value Ref Range Status   Specimen Description   Final    BLOOD LEFT WRIST Performed at [facility] [address], [place], [place] [address]    Special Requests   Final    BOTTLES DRAWN AEROBIC ONLY Blood Culture adequate volume Performed at [facility] [address], [place], [place] [address]    Culture   Final    NO GROWTH 2 DAYS Performed at [facility] [address], [place],  [address]    Report Status PENDING  Incomplete    Coagulation Studies: No results for input(s): LABPROT, INR in the last 72 hours.  Urinalysis: No results for input(s): COLORURINE, LABSPEC, PHURINE, GLUCOSEU, HGBUR, BILIRUBINUR, KETONESUR, PROTEINUR, UROBILINOGEN, NITRITE, LEUKOCYTESUR in the last 72 hours.  Invalid input(s): APPERANCEUR    Imaging: No  results found.   Medications:   . sodium chloride Stopped (07/12/20 1320)  . ceFEPime (MAXIPIME) IV Stopped (07/12/20 1350)   . (feeding supplement) PROSource Plus  30 mL Oral TID PC & HS  . amiodarone  200 mg Oral Daily  . antiseptic oral rinse  15 mL Mouth Rinse Q2H  . chlorhexidine  15 mL Mouth/Throat QID  . chlorhexidine  15 mL Mouth Rinse BID  . Chlorhexidine Gluconate Cloth  6 each Topical Daily  . DULoxetine  60 mg Oral Daily  . Gerhardt's butt cream   Topical QID  . mouth rinse  15 mL Mouth Rinse q12n4p  . metoprolol tartrate  5 mg Intravenous Q6H  . sodium chloride flush  10-40 mL Intracatheter Q12H  . Tbo-Filgrastim  480 mcg Subcutaneous q1800  . vancomycin  125 mg Oral QID   acetaminophen **OR** acetaminophen, ondansetron **OR** ondansetron (ZOFRAN) IV, sodium chloride flush  Assessment/ Plan:  1. CKD V - baseline creat 5.8- 6.2 and creatinine has stayed near this baseline since admission.  Possibly slight worsening related to sepsis.  Patient follows with Dr. Royce Macadamia outpatient.  Plan is to initiate dialysis when needed. AVF creation was  delayed due to acute illness.  Patient now transition to DNR status. 2. Diarrhea/Dehydration. Diarrhea mgmt per primary.   .  Encourage oral hydration.  Consider stopping fluids if kidney function is stable today. 3. Non-Anion Gap Metabolic acidosis -improved with IV sodium bicarbonate will change to normal saline 4. Neutropenic fevers - +pseudomonas bacteremia. Per pmd, on IV abx and IV granix.  Holding off on port removal for now. 5. Bladder cancer - per ONC, has been getting active chemoRx 6. Atrial fib - Slightly tachy but stable. Mgmt per primary 7. Pancytopenia - per primary, transfusions PRN.  8. Severe Hypokalemia: We will replete with IV potassium 10 mEq x 4 rounds 9. Hypernatremia we will start D5W with 20 mEq KCl at 100 cc an hour     LOS: [place] $RemoveBeforeD'@TODAY'[id number]$ $RemoveBe'@9'XoFDcZgca$ :40 AM

## 2020-07-13 NOTE — Progress Notes (Signed)
PROGRESS NOTE  DONLEY HARLAND KMQ:[id number] DOB: 07-14-1950 DOA: 07/02/2020 PCP: Shirline Frees, MD  Brief History   70 year old man PMH including bladder cancer, CKD stage V, atrial fibrillation presented with generalized weakness.  Admitted for pancytopenia and seen by oncology, has required multiple blood products, still remains pancytopenic with life threatening thrombocytopenia.  WBC slowly trending up and hemoglobin stable.  Found to have Pseudomonas bacteremia, seen by infectious disease and continues on antibiotics.  Source likely skin.  Worsening CKD, likely progressing to ESRD, seen by nephrology.  Not a candidate for dialysis.  Encephalopathy secondary to uremia worsening, renal function worsening, failure to thrive with trivial oral intake.  Prognosis guarded, may not survive this hospitalization.  A & P  Pancytopenia secondary to antineoplastic therapy.   Anemia of chronic disease. Patient status post chemotherapy earlier this month.   --Continue transfusion blood products as per oncology including platelets and PRBCs.  Platelets slightly lower today.  Hemoglobin stable.  WBC rising. --Continue filgrastim as per oncology  Sepsis secondary to Pseudomonas bacteremia, left axilla cellulitis present on admission --Hemodynamics remain stable.  Continue antibiotic per infectious disease.  Atrial fibrillation with rapid ventricular response, chronic atrial fibrillation  --Asymptomatic.  Cannot take oral amiodarone at this point, heart rate controlled on IV beta-blocker.  AKI superimposed on CKD stage V with metabolic acidosis, hyperphosphatemia --Nonoliguric although urine output has decreased.  Renal function worsening.  Acute metabolic encephalopathy secondary to uremia. --Encephalopathy worsening.  Nontoxigenic C. difficile diarrhea present on admission --ID recommended oral vancomycin for 10 days, will complete.  Suspect diarrhea is from chemotherapy or neutropenia.  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  Continue metoprolol.  Generalized weakness  Hyponatremia --Resolved.  Now has hypernatremia.  Nephrology adjusting IV fluids.  Obesity unspecified Body mass index is 32.89 kg/m.  Disposition Plan:  Discussion: Although hemoglobin is stable and WBC is trending up, platelets remain dangerously low, uremia and renal function worsening in the patient's more encephalopathic with failure to thrive from minimal oral intake.  I do not think recovery is likely and discussed in detail with wife and her daughter at bedside.  We discussed options including escalation of care (not desired by family), continuance of current regimen, and comfort care.  Annual weighing options.  At this point I think comfort care and hospice referral would be reasonable.  In the meantime we will continue current care and follow-up with family.  Status is: Inpatient  Remains inpatient appropriate because:Ongoing diagnostic testing needed not appropriate for outpatient work up, IV treatments appropriate due to intensity of illness or inability to take PO and Inpatient level of care appropriate due to severity of illness   Dispo: The patient is from: Home              Anticipated d/c is to: Home              Patient currently is not medically stable to d/c.   Difficult to place patient No  DVT prophylaxis: SCDs Start: 07/03/20 1451   Code Status: DNR Level of care: Telemetry Family Communication: wife and her daughter at bedside 4/2  [name], MD  [facility] Direct contact: see [email or web address] (further directions at bottom of note if needed) 7PM-7AM contact night coverage as at bottom of note 07/13/2020, 12:39 PM  LOS: 10 days   Significant Hospital Events   . Admit 3/23    Consults:  . Oncology  .  Nephrology . ID . General surgery    Procedures:  . None  Micro Data:  . 3/24  BC Pseudomonas aeruginosa   Antimicrobials:  . Cefepime   Interval History/Subjective  CC: f/u pancytopenia  Wife and daughter at bedside.  He does open his eyes somewhat.  Per RN clinically the patient has been worsening, not taking anything by mouth.  More confused and less responsive.  Objective   Vitals:  Vitals:   07/12/20 2016 07/13/20 0706  BP: 113/71 (!) 147/97  Pulse: (!) 104 (!) 103  Resp: 15 15  Temp: 97.9 F (36.6 C) 97.8 F (36.6 C)  SpO2: 99% 100%    Exam:  Constitutional:   . Appears ill, encephalopathic, only briefly responds to voice, does squeeze hands to command.  Not able to participate in conversation Respiratory:  . CTA bilaterally, no w/r/r.  . Respiratory effort normal.  Cardiovascular:  . irregular, heart rate 90s-100s, no m/r/g . No LE extremity edema   . Telemetry atrial fibrillation Abdomen:  . soft Skin:  . Left chest wall and axilla appear to be healing.  Scattered rash throughout the body without apparent change. Psychiatric:  . Mental status . Confused, somnolent, briefly arouses to voice but does not respond to questions.   I have personally reviewed the following:   Today's Data  . Urine output 500 . Potassium up to 3.4 . BUN up to 158, creatinine up to 7.34, remainder CMP unremarkable . WBC of 2.6, hemoglobin stable at 8, platelets down to 8  Scheduled Meds: . (feeding supplement) PROSource Plus  30 mL Oral TID PC & HS  . amiodarone  200 mg Oral Daily  . antiseptic oral rinse  15 mL Mouth Rinse Q2H  . chlorhexidine  15 mL Mouth/Throat QID  . chlorhexidine  15 mL Mouth Rinse BID  . Chlorhexidine Gluconate Cloth  6 each Topical Daily  . DULoxetine  60 mg Oral Daily  . Gerhardt's butt cream   Topical QID  . mouth rinse  15 mL Mouth Rinse q12n4p  . metoprolol tartrate  10 mg Intravenous Q6H  . sodium chloride flush  10-40 mL Intracatheter Q12H  . Tbo-Filgrastim  480 mcg Subcutaneous q1800  . vancomycin  125 mg Oral QID    Continuous Infusions: . ceFEPime (MAXIPIME) IV Stopped (07/12/20 1350)  . dextrose 5 % with KCl 20 mEq / L 20 mEq (07/13/20 1036)  . 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  1. Check the care team in [facility] and look for a) attending/consulting TRH provider listed and b) the [facility] team listed 2. Log into [email or web address] and use [place]'s universal password to access. If you do not have the password, please contact the hospital operator. 3. Locate the [facility] provider you are looking for under [facility] and page to a number that you can be directly  reached. 4. If you still have difficulty reaching the provider, please page the [facility] (Director on Call) for the Hospitalists listed on amion for assistance.

## 2020-07-14 ENCOUNTER — Ambulatory Visit: Payer: PPO | Admitting: Hematology & Oncology

## 2020-07-14 ENCOUNTER — Other Ambulatory Visit: Payer: PPO

## 2020-07-14 ENCOUNTER — Ambulatory Visit: Payer: PPO

## 2020-07-14 DIAGNOSIS — R7881 Bacteremia: Secondary | ICD-10-CM | POA: Diagnosis not present

## 2020-07-14 DIAGNOSIS — N179 Acute kidney failure, unspecified: Secondary | ICD-10-CM | POA: Diagnosis not present

## 2020-07-14 DIAGNOSIS — D61818 Other pancytopenia: Secondary | ICD-10-CM | POA: Diagnosis not present

## 2020-07-14 DIAGNOSIS — C679 Malignant neoplasm of bladder, unspecified: Secondary | ICD-10-CM | POA: Diagnosis not present

## 2020-07-14 LAB — CBC WITH DIFFERENTIAL/PLATELET
Abs Immature Granulocytes: 0.04 10*3/uL (ref 0.00–0.07)
Basophils Absolute: 0 10*3/uL (ref 0.0–0.1)
Basophils Relative: 0 %
Eosinophils Absolute: 0 10*3/uL (ref 0.0–0.5)
Eosinophils Relative: 0 %
HCT: 23.4 % — ABNORMAL LOW (ref 39.0–52.0)
Hemoglobin: 7.6 g/dL — ABNORMAL LOW (ref 13.0–17.0)
Immature Granulocytes: 3 %
Lymphocytes Relative: 22 %
Lymphs Abs: 0.3 10*3/uL — ABNORMAL LOW (ref 0.7–4.0)
MCH: 31.1 pg (ref 26.0–34.0)
MCHC: 32.5 g/dL (ref 30.0–36.0)
MCV: 95.9 fL (ref 80.0–100.0)
Monocytes Absolute: 0.1 10*3/uL (ref 0.1–1.0)
Monocytes Relative: 10 %
Neutro Abs: 1 10*3/uL — ABNORMAL LOW (ref 1.7–7.7)
Neutrophils Relative %: 65 %
Platelets: <5 10*3/uL — CL (ref 150–400)
RBC: 2.44 MIL/uL — ABNORMAL LOW (ref 4.22–5.81)
RDW: 15.8 % — ABNORMAL HIGH (ref 11.5–15.5)
WBC: 1.4 10*3/uL — CL (ref 4.0–10.5)
nRBC: 0 % (ref 0.0–0.2)

## 2020-07-14 LAB — RENAL FUNCTION PANEL
Albumin: 1.6 g/dL — ABNORMAL LOW (ref 3.5–5.0)
Anion gap: 12 (ref 5–15)
BUN: 165 mg/dL — ABNORMAL HIGH (ref 8–23)
CO2: 25 mmol/L (ref 22–32)
Calcium: 7.7 mg/dL — ABNORMAL LOW (ref 8.9–10.3)
Chloride: 105 mmol/L (ref 98–111)
Creatinine, Ser: 8.04 mg/dL — ABNORMAL HIGH (ref 0.61–1.24)
GFR, Estimated: 7 mL/min — ABNORMAL LOW (ref >60)
Glucose, Bld: 174 mg/dL — ABNORMAL HIGH (ref 70–99)
Phosphorus: 6.7 mg/dL — ABNORMAL HIGH (ref 2.5–4.6)
Potassium: 3.9 mmol/L (ref 3.5–5.1)
Sodium: 142 mmol/L (ref 135–145)

## 2020-07-14 MED ORDER — LORAZEPAM 2 MG/ML PO CONC: 0.5000 mg | ORAL | Status: DC | PRN

## 2020-07-14 MED ORDER — LORAZEPAM 0.5 MG PO TABS: 0.5000 mg | ORAL_TABLET | ORAL | Status: DC | PRN

## 2020-07-14 MED ORDER — MORPHINE SULFATE (CONCENTRATE) 10 MG/0.5ML PO SOLN: 5.0000 mg | ORAL | Status: DC | PRN

## 2020-07-14 MED ORDER — GLYCOPYRROLATE 0.2 MG/ML IJ SOLN: 0.2000 mg | INTRAMUSCULAR | Status: DC | PRN

## 2020-07-14 MED ORDER — POLYVINYL ALCOHOL 1.4 % OP SOLN
1.0000 [drp] | Freq: Four times a day (QID) | OPHTHALMIC | Status: DC | PRN
  Filled 2020-07-14: qty 15

## 2020-07-14 MED ORDER — SODIUM CHLORIDE 0.9% IV SOLUTION: Freq: Once | INTRAVENOUS | Status: AC

## 2020-07-14 MED ORDER — LORAZEPAM 2 MG/ML IJ SOLN: 1.0000 mg | INTRAMUSCULAR | Status: DC | PRN

## 2020-07-14 MED ORDER — GLYCOPYRROLATE 1 MG PO TABS: 1.0000 mg | ORAL_TABLET | ORAL | Status: DC | PRN

## 2020-07-14 MED ORDER — MORPHINE SULFATE (CONCENTRATE) 10 MG/0.5ML PO SOLN
5.0000 mg | ORAL | Status: DC | PRN
  Administered 2020-07-15: 5 mg via SUBLINGUAL
  Filled 2020-07-14: qty 0.5

## 2020-07-14 MED ORDER — LORAZEPAM 1 MG PO TABS: 1.0000 mg | ORAL_TABLET | ORAL | Status: DC | PRN

## 2020-07-14 MED ORDER — LORAZEPAM 2 MG/ML IJ SOLN: 0.5000 mg | INTRAMUSCULAR | Status: DC | PRN

## 2020-07-14 MED ORDER — LORAZEPAM 2 MG/ML PO CONC: 1.0000 mg | ORAL | Status: DC | PRN

## 2020-07-14 NOTE — Progress Notes (Signed)
   07/11/20 0818  Assess: MEWS Score  Temp 98.3 F (36.8 C)  BP 127/76  Pulse Rate (!) 122  Resp 19  SpO2 100 %  Assess: MEWS Score  MEWS Temp 0  MEWS Systolic 0  MEWS Pulse 2  MEWS RR 0  MEWS LOC 0  MEWS Score 2  MEWS Score Color Yellow  Assess: if the MEWS score is Yellow or Red  Were vital signs taken at a resting state? Yes  Focused Assessment No change from prior assessment  Early Detection of Sepsis Score *See Row Information* Low  MEWS guidelines implemented *See Row Information* Yes  Treat  MEWS Interventions Administered scheduled meds/treatments  Take Vital Signs  Increase Vital Sign Frequency  Yellow: Q 2hr X 2 then Q 4hr X 2, if remains yellow, continue Q 4hrs  Escalate  MEWS: Escalate Yellow: discuss with charge nurse/RN and consider discussing with provider and RRT  Notify: Charge Nurse/RN  Name of Charge Nurse/RN Notified Merwyn Katos  Date Charge Nurse/RN Notified 07/11/20  Time Charge Nurse/RN Notified 0211  Document  Patient Outcome Other (Comment) (medication for HR control administered)  Progress note created (see row info) Yes

## 2020-07-14 NOTE — TOC Progression Note (Addendum)
Transition of Care [facility]) - Progression Note    Patient Details  Name: [name] Rose MRN: [id number] Date of Birth: 05/23/1950  Transition of Care [facility]) CM/SW Contact  Santhiago Collingsworth, Marjie Skiff, RN Phone Number: 07/14/2020, 1:41 PM  Clinical Narrative:    [facility] consult for [facility]. Spoke with wife in the room to confirm plan.  [facility].   Expected Discharge Plan: [place] Barriers to Discharge: Continued Medical Work up  Expected Discharge Plan and Services Expected Discharge Plan: [place]   Discharge Planning Services: CM Consult    [facility] Agency: [facility] Date [place]: 07/14/20 Time [place]: 1300 Representative spoke with at [place]: [place] ([employer] Interventions    Readmission Risk Interventions Readmission Risk Prevention Plan 02/22/2020 09/26/2019 09/08/2018  Transportation Screening Complete Complete Complete  PCP or Specialist Appt within 3-5 Days - Complete -  HRI or [place] - Complete -  Social Work Consult for [place] Planning/Counseling - Complete -  Palliative Care Screening - Not Applicable -  Medication Review Press photographer) Complete Complete Complete  PCP or Specialist appointment within 3-5 days of discharge Complete - Not Complete  PCP/Specialist Appt Not Complete comments - - not yet ready for d/c  HRI or Home Care Consult Complete - Not Complete  HRI or Home Care Consult Pt Refusal Comments - - no needs identified at this time  SW Recovery Care/Counseling Consult Complete - Not Complete  SW Consult Not Complete Comments - - no needs identified at this time  Palliative Care Screening Not Applicable - Not [place] Not Applicable - Not Applicable  Some recent data might be hidden

## 2020-07-14 NOTE — Progress Notes (Signed)
Finally, the white cell count coming up nicely.  However, we are in a situation that he just is not getting much better.  He is in profound renal failure.  His BUN is 165 creatinine 8.04.  He really is not that responsive to me.  His platelet count is less than 5000.  I am sure that he has some alloimmunization to platelet transfusions.  Provide 1 to stop the Neupogen now.  This may help his platelet count a little bit.  He is not eating.  I am not sure he still having diarrhea.  I am just feel bad that the white cell count is recovering but yet overall his status is just not improving.  I totally agree with the decision to get Hospice for him.  I want him to have comfort.  I do not want his wife to have to struggle trying to care for him.  He just is not a candidate for going home.  I know he is fought hard.  He has tried his best.  I am sure that he is to some degree uremic.  His vital signs look decent.  His blood pressure is 135/86.  His pulse is 55.  Temperature is 97.7.  I will have to speak to his wife today.  I would like to make sure that we follow her recommendations.  Again, I really do not think that we need to pursue dialysis.  He has metastatic cancer that really is not treatable.  I just want him to have comfort, respect and dignity.  We will have to give some platelets today.  I would not give any red blood cells.  I know the staff on [address] have done a fantastic job with him.  [name], MD  1 Corinthians 16:13

## 2020-07-14 NOTE — Progress Notes (Signed)
PT Cancellation Note  Patient Details Name: [name] Rose MRN: [id number] DOB: 1950/06/20   Cancelled Treatment:     per chart review and confirmed with RN....the patient unable to participate in Physical Therapy.  Will have LPT perform peer chart review to either adjust goals or if need to D/C from Acute PT services.   Nathanial Rancher 07/14/2020, 3:16 PM

## 2020-07-14 NOTE — Progress Notes (Signed)
PROGRESS NOTE  ZAXTON ANGERER CNO:[id number] DOB: 1950-05-10 DOA: 07/02/2020 PCP: Shirline Frees, MD  Brief History   70 year old man PMH including bladder cancer, CKD stage V, atrial fibrillation presented with generalized weakness.  Admitted for pancytopenia and seen by oncology, required multiple blood products, has remained pancytopenic with life threatening thrombocytopenia.  WBC slowly trending up, hemoglobin stable.  Found to have Pseudomonas bacteremia, seen by infectious disease and treated with antibiotics.  Source likely skin.  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  Not a candidate for dialysis.  Encephalopathy secondary to uremia worsening, renal function worsening, failure to thrive with trivial oral intake, life-threatening thrombocytopenia.  Overall condition continues to worsen daily.  In discussion with wife and family, decision to proceed with full comfort care and residential hospice placement. Discussed in detail with [name].  We both agree and transition to comfort care and residential hospice.  Prognosis less than 2 weeks.  A & P  Pancytopenia secondary to antineoplastic therapy.   Anemia of chronic disease. Patient status post chemotherapy earlier this month.   --Life-threatening thrombocytopenia persists.  Hemoglobin stable and WBC improved. --Full comfort care now.  Sepsis secondary to Pseudomonas bacteremia, left axilla cellulitis present on admission --Hemodynamics remain stable.   --Stop antibiotics.  Full comfort care.  Atrial fibrillation with rapid ventricular response, chronic atrial fibrillation  --Full comfort care, stop medications.  AKI superimposed on CKD stage V with metabolic acidosis, hyperphosphatemia --Nonoliguric although urine output has decreased.  Renal function worsening. --Full comfort care  Acute metabolic encephalopathy secondary to uremia. --Encephalopathy worsening daily.  Nontoxigenic C. difficile  diarrhea present on admission --ID recommended oral vancomycin for 10 days, stop now based on goals of care.  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  Now has hypernatremia.  Nephrology adjusting IV fluids.  Obesity unspecified Body mass index is 32.89 kg/m.  Disposition Plan:  Discussion: Advance care planning discussion.  Discussed in detail with wife and her daughter at bedside.  Reviewed current issues as documented above specifically worsening renal function, life-threatening thrombocytopenia, failure to thrive.  In addition he has cancer which is not amenable to treatment.  Given worsening status, family requested full comfort care which I think is appropriate and placement at residential hospice.  [name] in agreement.  Status is: Inpatient  Remains inpatient appropriate because:Ongoing diagnostic testing needed not appropriate for outpatient work up, IV treatments appropriate due to intensity of illness or inability to take PO and Inpatient level of care appropriate due to severity of illness   Dispo: The patient is from: Home              Anticipated d/c is to: Home              Patient currently is not medically stable to d/c.   Difficult to place patient No  DVT prophylaxis:    Code Status: DNR Level of care: Telemetry Family Communication: wife and her daughter at bedside 4/2  [name], MD  [facility] Direct contact: see [email or web address] (further directions at bottom of note if needed) 7PM-7AM contact night coverage as at bottom of note 07/14/2020, 2:30 PM  LOS: 11 days   Significant Hospital Events   . Admit 3/23  . 4/4 made comfort care   Consults:  . Oncology  . Nephrology . ID . General surgery    Procedures:  . None  Micro  Data:  . 3/24 BC Pseudomonas aeruginosa   Antimicrobials:  . Cefepime   Interval History/Subjective   CC: f/u pancytopenia  Wife and daughter at bedside.  Patient occasionally opens eyes.  Not able to offer history.  RN notes patient appears clinically to be worsening  Objective   Vitals:  Vitals:   07/14/20 1129 07/14/20 1309  BP: 107/68 104/70  Pulse: 85 62  Resp: 16 16  Temp: 98 F (36.7 C) 97.9 F (36.6 C)  SpO2:  98%    Exam:  Constitutional:   . Appears calm and comfortable, acutely ill, appears encephalopathic Respiratory:  . CTA bilaterally, no w/r/r.  . Respiratory effort normal.  Cardiovascular:  . RRR, no m/r/g . No significant LE extremity edema   Abdomen:  . soft Skin:  . No change to left chest wall and axilla.  There are some induration but no fluctuance and erythema appears to be resolving.  Lesions appear to be healing. Psychiatric:  . Mental status . Cannot assess mood or affect, briefly opens eyes falls immediately back to sleep   I have personally reviewed the following:   Today's Data  . Urine output 1050 . Creatinine up to 8.04 . Albumin 1.6 . Hemoglobin stable 7.6.  WBC up to 1.4.  Platelets less than 5.  Scheduled Meds: . (feeding supplement) PROSource Plus  30 mL Oral TID PC & HS  . antiseptic oral rinse  15 mL Mouth Rinse Q2H  . chlorhexidine  15 mL Mouth/Throat QID  . chlorhexidine  15 mL Mouth Rinse BID  . Chlorhexidine Gluconate Cloth  6 each Topical Daily  . Gerhardt's butt cream   Topical QID  . mouth rinse  15 mL Mouth Rinse q12n4p  . sodium chloride flush  10-40 mL Intracatheter Q12H   Continuous Infusions:   Principal Problem:   Pancytopenia (HCC) Active Problems:   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 (HCC)   Thrombocytopenia (HCC)   AKI (acute kidney injury) ([place])   Bacteremia due to Pseudomonas   Essential hypertension   Chronic atrial fibrillation (HCC)   Neutropenia (HCC)   CKD (chronic kidney disease) stage 5, GFR less than 15 ml/min (HCC)   Weakness generalized   Anemia of chronic  disease   Sepsis ([place])   LOS: 11 days   How to contact the [facility] Attending or Consulting provider 7A - 7P or covering provider during after hours [place], for this patient?  1. Check the care team in [facility] and look for a) attending/consulting TRH provider listed and b) the [facility] team listed 2. Log into [email or web address] and use [place]'s universal password to access. If you do not have the password, please contact the hospital operator. 3. Locate the [facility] provider you are looking for under [facility] and page to a number that you can be directly reached. 4. If you still have difficulty reaching the provider, please page the [facility] (Director on Call) for the Hospitalists listed on amion for assistance.

## 2020-07-14 NOTE — Progress Notes (Signed)
[facility] ROUNDING NOTE   Subjective:   Brief history.   70 year old gentleman history of bladder cancer stage V chronic kidney disease atrial fibrillation admitted with neutropenic fever pancytopenia and Pseudomonas bacteremia.  Acute on chronic kidney disease changes baseline creatinine about 6 mg/dL. Creat up to 8 today and pt poorly responsive worsening the last 3-4 days off and on per family.   Transition to DNR status and comfort care now.     Objective:  Vital signs in last 24 hours:  Temp:  [97.7 F (36.5 C)-98.9 F (37.2 C)] 97.9 F (36.6 C) (04/04 1309) Pulse Rate:  [55-98] 62 (04/04 1309) Resp:  [16] 16 (04/04 1309) BP: (100-135)/(54-86) 104/70 (04/04 1309) SpO2:  [81 %-100 %] 98 % (04/04 1309)  Weight change:  Filed Weights   07/09/20 0520 07/12/20 0453 07/13/20 0500  Weight: 116.2 kg 117.6 kg 118.8 kg    Intake/Output: I/O last 3 completed shifts: In: 105.6 [I.V.:5.6; IV Piggyback:100] Out: 1550 [Urine:1550]   Intake/Output this shift:  Total I/O In: 737.7 [Blood:737.7] Out: -   GEN: Chronically ill-appearing, lying in bed, confused and somnolent ENT: no nasal discharge, mmm EYES: no scleral icterus, eomi CV: Tachycardic PULM: no iwob, bilateral chest rise ABD: NABS, non-distended SKIN: no rashes or jaundice EXT: no edema, warm and well perfused   Basic Metabolic Panel: Recent Labs  Lab 07/08/20 0519 07/09/20 0840 07/10/20 0525 07/11/20 0455 07/12/20 0424 07/13/20 0527 07/14/20 0614  NA 132* 133* 134* 140 144 147* 142  K 3.1* 2.9* 2.8* 2.6* 2.9* 3.3* 3.9  CL 103 103 104 104 104 106 105  CO2 16* 17* 14* 21* $Remov'26 27 25  'xsJMLV$ GLUCOSE 156* 125* 129* 177* 188* 111* 174*  BUN 97* 111* 130* 144* 143* 158* 165*  CREATININE 6.07* 6.32* 6.76* 6.85* 6.77* 7.34* 8.04*  CALCIUM 7.7* 8.0* 7.7* 7.9* 7.6* 7.9* 7.7*  MG 1.9 1.9  --  1.8 2.3  --   --   PHOS 4.6 5.5*  --   --  5.4*  --  6.7*    Liver Function Tests: Recent Labs  Lab  07/09/20 0840 07/10/20 0525 07/11/20 0455 07/12/20 0424 07/13/20 0527 07/14/20 0614  AST 14* $Remov'19 27 19 18  'BQeBuc$ --   ALT 22 25 35 28 26  --   ALKPHOS 23* 24* 24* 20* 23*  --   BILITOT 1.8* 2.4* 2.4* 2.0* 2.2*  --   PROT 4.7* 4.5* 4.4* 3.9* 4.2*  --   ALBUMIN 2.0* 1.8* 1.9* 1.6* 1.5* 1.6*   No results for input(s): LIPASE, AMYLASE in the last 168 hours. No results for input(s): AMMONIA in the last 168 hours.  CBC: Recent Labs  Lab 07/10/20 0525 07/11/20 0455 07/12/20 0424 07/13/20 0527 07/14/20 0614  WBC 0.2* 0.2* 0.3* 0.6* 1.4*  NEUTROABS  --   --  0.2* 0.3* 1.0*  HGB 8.8* 8.7* 7.4* 8.0* 7.6*  HCT 26.3* 25.4* 21.8* 24.1* 23.4*  MCV 94.3 91.7 91.2 93.1 95.9  PLT 9* <5* 15* 8* <5*     Medications:    . (feeding supplement) PROSource Plus  30 mL Oral TID PC & HS  . antiseptic oral rinse  15 mL Mouth Rinse Q2H  . chlorhexidine  15 mL Mouth/Throat QID  . chlorhexidine  15 mL Mouth Rinse BID  . Chlorhexidine Gluconate Cloth  6 each Topical Daily  . Gerhardt's butt cream   Topical QID  . mouth rinse  15 mL Mouth Rinse q12n4p  . sodium chloride  flush  10-40 mL Intracatheter Q12H   acetaminophen **OR** acetaminophen, glycopyrrolate **OR** glycopyrrolate **OR** glycopyrrolate, LORazepam **OR** LORazepam **OR** LORazepam, morphine CONCENTRATE **OR** morphine CONCENTRATE, ondansetron **OR** ondansetron (ZOFRAN) IV, polyvinyl alcohol, sodium chloride flush  Assessment/ Plan:  1. CKD V - baseline creat 5.8- 6.2 and creatinine has stayed near this baseline since admission.  Creat rising the last 3-4 days and pt now uremic w/ confusion and somnolence. Per ONC not a good HD candidate w/ advanced cancer and is now transitioning to comfort care. No further suggestions, will sign off.  2. Diarrhea/Dehydration. Diarrhea mgmt per primary 3. Non-Anion Gap Metabolic acidosis -improved  4. Neutropenic fevers - +pseudomonas bacteremia 5. Bladder cancer - per ONC, had been getting active  chemoRx 6. Atrial fib - Slightly tachy but stable. Mgmt per primary 7. Pancytopenia - per primary   Kelly Splinter, MD 07/14/2020, 3:57 PM

## 2020-07-14 NOTE — Care Management Important Message (Signed)
Important Message  Patient Details IM Letter given to the Patient. Name: [name] Rose MRN: [id number] Date of Birth: 05/04/1950   Medicare Important Message Given:  Yes     Kerin Salen 07/14/2020, 10:19 AM

## 2020-07-14 NOTE — TOC Progression Note (Incomplete)
Transition of Care [facility]) - Progression Note    Patient Details  Name: MONTEY EBEL MRN: [id number] Date of Birth: 01/18/1951  Transition of Care [facility]) CM/SW Contact  CLEMENTS, Marjie Skiff, RN Phone Number: 07/14/2020, 1:05 PM  Clinical Narrative:            Expected Discharge Plan and Services                                                 Social Determinants of Health (SDOH) Interventions    Readmission Risk Interventions Readmission Risk Prevention Plan 02/22/2020 09/26/2019 09/08/2018  Transportation Screening Complete Complete Complete  PCP or Specialist Appt within 3-5 Days - Complete -  HRI or [place] - Complete -  Social Work Consult for [place] Planning/Counseling - Complete -  Palliative Care Screening - Not Applicable -  Medication Review Press photographer) Complete Complete Complete  PCP or Specialist appointment within 3-5 days of discharge Complete - Not Complete  PCP/Specialist Appt Not Complete comments - - not yet ready for d/c  HRI or Home Care Consult Complete - Not Complete  HRI or Home Care Consult Pt Refusal Comments - - no needs identified at this time  SW Recovery Care/Counseling Consult Complete - Not Complete  SW Consult Not Complete Comments - - no needs identified at this time  Palliative Care Screening Not Applicable - Not [place] Not Applicable - Not Applicable  Some recent data might be hidden

## 2020-07-15 DIAGNOSIS — D61818 Other pancytopenia: Secondary | ICD-10-CM | POA: Diagnosis not present

## 2020-07-15 LAB — CULTURE, BLOOD (ROUTINE X 2)
Culture: NO GROWTH
Culture: NO GROWTH
Special Requests: ADEQUATE
Special Requests: ADEQUATE

## 2020-07-15 LAB — BPAM PLATELET PHERESIS
Blood Product Expiration Date: 202204042359
ISSUE DATE / TIME: 202204041100
Unit Type and Rh: 5100

## 2020-07-15 LAB — PREPARE PLATELET PHERESIS: Unit division: 0

## 2020-07-15 NOTE — Discharge Summary (Signed)
Physician Discharge Summary  DENZAL MEIR YYQ:[id number] DOB: [birth year] DOA: 07/02/2020  PCP: Shirline Frees, MD  Admit date: 07/02/2020 Discharge date: 07/15/2020  Recommendations for Outpatient Follow-up:  1. Hospice care.  As per protocol medications deferred to hospice.     Discharge Diagnoses: Principal diagnosis is #1 Principal Problem:   Pancytopenia ([place]) Active Problems:   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 (HCC)   Thrombocytopenia (HCC)   AKI (acute kidney injury) ([place])   Bacteremia due to Pseudomonas   Essential hypertension   Chronic atrial fibrillation (HCC)   Neutropenia (HCC)   CKD (chronic kidney disease) stage 5, GFR less than 15 ml/min (HCC)   Weakness generalized   Anemia of chronic disease   Sepsis ([place])   Discharge Condition: improved Disposition: residential hospice  Diet recommendation:  Diet Orders (From admission, onward)    Start     Ordered   07/11/20 1426  DIET - DYS 1 Room service appropriate? No; Fluid consistency: Thin  Diet effective now       Comments: Crush meds in puree; allow water/ice chips before or after but not during meals.  Question Answer Comment  Room service appropriate? No   Fluid consistency: Thin      07/11/20 1427           Filed Weights   07/09/20 0520 07/12/20 0453 07/13/20 0500  Weight: 116.2 kg 117.6 kg 118.8 kg    HPI/Hospital Course:   70 year old man PMH including bladder cancer, CKD stage V, atrial fibrillation presented with generalized weakness.  Admitted for pancytopenia and seen by oncology, required multiple blood products, has remained pancytopenic with life threatening thrombocytopenia.  WBC slowly trending up, hemoglobin stable.  Found to have Pseudomonas bacteremia, seen by infectious disease and treated with antibiotics.  Source likely skin.  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  Not a candidate for dialysis per oncology.  Encephalopathy  secondary to uremia worsening, renal function worsening, failure to thrive with trivial oral intake, life-threatening thrombocytopenia.  Overall condition continues to worsen daily.  In discussion with wife and family, decision to proceed with full comfort care and residential hospice placement. Discussed in detail with [name].  We both agree and transition to comfort care and residential hospice.  Prognosis less than 2 weeks.  Appears stable today for transfer to hospice facility.  I think he would be better off with a hospice facility.  Discussed with wife and daughter at bedside.  We discussed the usual transport risks including spontaneous death during transport, they understand.  Pancytopenia secondary to antineoplastic therapy.   Anemia of chronic disease. Patient status post chemotherapy earlier this month.   --Life-threatening thrombocytopenia persists.  Hemoglobin stable and WBC improved. --Full comfort care now.  Sepsis secondary to Pseudomonas bacteremia, left axilla cellulitis present on admission --Hemodynamics remain stable.  Treated with antibiotics and seen by infectious disease. --Stopped antibiotics.  Full comfort care.  Atrial fibrillation with rapid ventricular response, chronic atrial fibrillation  --Full comfort care, stopped medications.  AKI superimposed on CKD stage V with metabolic acidosis, hyperphosphatemia --Failed to improve despite nephrology interventions.  Full comfort care  Acute metabolic encephalopathy secondary to uremia. --Encephalopathy worsening daily.  Nontoxigenic C. difficile diarrhea present on admission --ID recommended oral vancomycin, stopped given goals of care.  Bladder cancer metastatic to intra-abdominal lymph nodes --Comfort care  Essential hypertension --No treatment indicated.  Generalized weakness  Hyponatremia --Resolved.  Now has hypernatremia.  Nephrology adjusting IV fluids.  Obesity unspecified Body mass index is 32.89  kg/m.    Significant Hospital Events    Admit 3/23   4/4 made comfort care  Consults:   Oncology   Nephrology  ID  General surgery   Procedures:   None  Micro Data:   3/24 BC Pseudomonas aeruginosa  Antimicrobials:   Cefepime    Today's assessment: S: CC: f/u pancytopenia  Restless and moaning earlier per family and nurse More comfortable now after morphine  O: Vitals:  Vitals:   07/14/20 1309 07/15/20 0701  BP: 104/70 (!) 105/59  Pulse: 62 92  Resp: 16 15  Temp: 97.9 F (36.6 C) 97.9 F (36.6 C)  SpO2: 98% (!) 79%    Constitutional:  . Appears ill but awakens to voice and attempts to respond verbally Respiratory:  . CTA bilaterally, no w/r/r.  . Respiratory effort normal.  Cardiovascular:  . RRR, no m/r/g . 1+ BLE extremity edema   Abdomen:  . Abdomen appears normal. Soft.  Skin:  . Stable  Psychiatric:  . Mental status o Briefly arouses to voice.    Discharge Instructions   Allergies as of 07/15/2020   No Known Allergies     Medication List    STOP taking these medications   acetaminophen 500 MG tablet Commonly known as: TYLENOL   amiodarone 200 MG tablet Commonly known as: PACERONE   apixaban 5 MG Tabs tablet Commonly known as: Eliquis   atorvastatin 40 MG tablet Commonly known as: LIPITOR   cyclobenzaprine 7.5 MG tablet Commonly known as: FEXMID   DULoxetine 60 MG capsule Commonly known as: CYMBALTA   famotidine 20 MG tablet Commonly known as: PEPCID   furosemide 40 MG tablet Commonly known as: LASIX   lidocaine-prilocaine cream Commonly known as: EMLA   loperamide 2 MG tablet Commonly known as: IMODIUM A-D   magnesium oxide 400 MG tablet Commonly known as: MAG-OX   metoprolol tartrate 25 MG tablet Commonly known as: LOPRESSOR   oxybutynin 10 MG 24 hr tablet Commonly known as: DITROPAN-XL   potassium chloride 10 MEQ tablet Commonly known as: KLOR-CON   prochlorperazine 10 MG  tablet Commonly known as: COMPAZINE   pyridoxine 100 MG tablet Commonly known as: B-6   sodium bicarbonate 650 MG tablet   Vitamin D (Ergocalciferol) 1.25 MG (50000 UNIT) Caps capsule Commonly known as: DRISDOL      No Known Allergies  The results of significant diagnostics from this hospitalization (including imaging, microbiology, ancillary and laboratory) are listed below for reference.    Significant Diagnostic Studies: US RENAL  Result Date: 07/04/2020 CLINICAL DATA:  Acute renal failure superimposed on stage 5 chronic kidney disease. History of bladder tumor post change urethral resection. EXAM: RENAL / URINARY TRACT ULTRASOUND COMPLETE COMPARISON:  Head CT 06/04/2020 FINDINGS: Right Kidney: Renal measurements: 11.3 x 4.8 x 5.5 cm = volume: 156 mL. Thinning of the renal parenchyma with increased echogenicity. No hydronephrosis. No evidence of focal lesion. Left Kidney: Renal measurements: 12.9 x 5.5 x 5.0 cm = volume: 171 mL. Thinning of the renal parenchyma with increased echogenicity. No hydronephrosis. No evidence of focal renal lesion. Bladder: Physiologically distended. Area of slightly lobulated echogenic bladder wall thickening is seen about the right bladder wall. No vascularity. Other: None. IMPRESSION: 1. No obstructive uropathy. 2. Bilateral renal parenchymal thinning and increased echogenicity consistent with chronic medical renal disease. 3. Echogenic bladder wall thickening in the right bladder, corresponding to that seen on prior PET CT, at site of hypermetabolism prior  PET. Electronically Signed   By: Narda Rutherford M.D.   On: 07/04/2020 17:36   DG Chest Port 1 View  Result Date: 07/03/2020 CLINICAL DATA:  Weakness EXAM: PORTABLE CHEST 1 VIEW COMPARISON:  September 24, 2018 FINDINGS: The heart size and mediastinal contours are mildly enlarged. A right-sided MediPort catheter seen with the tip at the superior cavoatrial junction. Both lungs are clear. The visualized skeletal  structures are unremarkable. IMPRESSION: No active disease. Electronically Signed   By: Jonna Clark M.D.   On: 07/03/2020 01:44   DG Abd Portable 2V  Result Date: 07/04/2020 CLINICAL DATA:  Pseudomonas, diarrhea. EXAM: PORTABLE ABDOMEN - 2 VIEW COMPARISON:  February 12, 2020. FINDINGS: The bowel gas pattern is normal. There is no evidence of free air. No radio-opaque calculi or other significant radiographic abnormality is seen. IMPRESSION: Negative. Electronically Signed   By: Lupita Raider M.D.   On: 07/04/2020 08:54   [place] CHEST SOFT TISSUE  Result Date: 07/07/2020 CLINICAL DATA:  70 year old male with concern for left axillary abscess. EXAM: ULTRASOUND OF Left AXILLARY SOFT TISSUES TECHNIQUE: Ultrasound examination of the axillary soft tissues was performed in the area of clinical concern. COMPARISON:  None. FINDINGS: Mild subcutaneous edema is noted in the region of interest. There are no visible subcutaneous masses or fluid collections. IMPRESSION: Mild subcutaneous edema without evidence of underlying fluid collection in the left axilla. Electronically Signed   By: Marliss Coots MD   On: 07/07/2020 13:19    Microbiology: Recent Results (from the past 240 hour(s))  Culture, blood (Routine X 2) w Reflex to ID Panel     Status: None   Collection Time: 07/10/20  7:37 AM   Specimen: BLOOD  Result Value Ref Range Status   Specimen Description   Final    BLOOD RIGHT WRIST Performed at [facility], [address], [place], [place] [address]    Special Requests   Final    BOTTLES DRAWN AEROBIC ONLY Blood Culture adequate volume Performed at [facility], [address], [place], [place] [address]    Culture   Final    NO GROWTH 5 DAYS Performed at [facility], [address] [place], [place] [address]    Report Status 07/15/2020 FINAL  Final  Culture, blood (Routine X 2) w Reflex to ID Panel     Status: None   Collection Time: 07/10/20  7:37  AM   Specimen: BLOOD  Result Value Ref Range Status   Specimen Description   Final    BLOOD LEFT WRIST Performed at [facility], [address], [place], [place] [address]    Special Requests   Final    BOTTLES DRAWN AEROBIC ONLY Blood Culture adequate volume Performed at [facility], [address] [place], [place] [address]    Culture   Final    NO GROWTH 5 DAYS Performed at [facility], [address] [place], [place] [address]    Report Status 07/15/2020 FINAL  Final     Labs: Basic Metabolic Panel: Recent Labs  Lab 07/09/20 0840 07/10/20 0525 07/11/20 0455 07/12/20 0424 07/13/20 0527 07/14/20 0614  NA 133* 134* 140 144 147* 142  K 2.9* 2.8* 2.6* 2.9* 3.3* 3.9  CL 103 104 104 104 106 105  CO2 17* 14* 21* 26 27 25   GLUCOSE 125* 129* 177* 188* 111* 174*  BUN 111* 130* 144* 143* 158* 165*  CREATININE 6.32* 6.76*  6.85* 6.77* 7.34* 8.04*  CALCIUM 8.0* 7.7* 7.9* 7.6* 7.9* 7.7*  MG 1.9  --  1.8 2.3  --   --   PHOS 5.5*  --   --  5.4*  --  6.7*   Liver Function Tests: Recent Labs  Lab 07/09/20 0840 07/10/20 0525 07/11/20 0455 07/12/20 0424 07/13/20 0527 07/14/20 0614  AST 14* $Remov'19 27 19 18  'LMZINe$ --   ALT 22 25 35 28 26  --   ALKPHOS 23* 24* 24* 20* 23*  --   BILITOT 1.8* 2.4* 2.4* 2.0* 2.2*  --   PROT 4.7* 4.5* 4.4* 3.9* 4.2*  --   ALBUMIN 2.0* 1.8* 1.9* 1.6* 1.5* 1.6*   CBC: Recent Labs  Lab 07/10/20 0525 07/11/20 0455 07/12/20 0424 07/13/20 0527 07/14/20 0614  WBC 0.2* 0.2* 0.3* 0.6* 1.4*  NEUTROABS  --   --  0.2* 0.3* 1.0*  HGB 8.8* 8.7* 7.4* 8.0* 7.6*  HCT 26.3* 25.4* 21.8* 24.1* 23.4*  MCV 94.3 91.7 91.2 93.1 95.9  PLT 9* <5* 15* 8* <5*    Principal Problem:   Pancytopenia (HCC) Active Problems:   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

## 2020-07-15 NOTE — TOC Transition Note (Signed)
Transition of Care [facility]) - CM/SW Discharge Note   Patient Details  Name: [name] Rose MRN: [id number] Date of Birth: [birth year]  Transition of Care [facility]) CM/SW Contact:  Lynnell Catalan, RN Phone Number: 07/15/2020, 1:42 PM   Clinical Narrative:    Pt to dc to [facility] today via [facility]. Yellow DNR on chart for transport. RN to call report to [phone].   Final next level of care: [place] Barriers to Discharge: Continued Medical Work up  Discharge Plan and Services   Discharge Planning Services: CM Consult                        [facility] Agency: [facility] Date [place]: 07/14/20 Time [place]: 1300 Representative spoke with at [place]: [place] ([place]) Interventions     Readmission Risk Interventions Readmission Risk Prevention Plan 02/22/2020 09/26/2019 09/08/2018  Transportation Screening Complete Complete Complete  PCP or Specialist Appt within 3-5 Days - Complete -  HRI or [place] - Complete -  Social Work Consult for [place] Planning/Counseling - Complete -  Palliative Care Screening - Not Applicable -  Medication Review Press photographer) Complete Complete Complete  PCP or Specialist appointment within 3-5 days of discharge Complete - Not Complete  PCP/Specialist Appt Not Complete comments - - not yet ready for d/c  HRI or Home Care Consult Complete - Not Complete  HRI or Home Care Consult Pt Refusal Comments - - no needs identified at this time  SW Recovery Care/Counseling Consult Complete - Not Complete  SW Consult Not Complete Comments - - no needs identified at this time  Palliative Care Screening Not Applicable - Not [place] Not Applicable - Not Applicable  Some recent data might be hidden

## 2020-07-15 NOTE — Progress Notes (Signed)
Overall, Mr. Ihnen really is no better.  He may be a little bit more responsive.  He does answer questions in 1 word responses.  There is no labs on him today.  We are trying to get him to [place].  I think this would be optimal for he and his family.  I talked to his wife yesterday afternoon.  She understands the situation that we are in.  He has profound renal failure.  His white cell count is coming back slowly.  We do not know what his platelet count is.  There is no bleeding.  I just want to make sure that he has comfort.  He does appear to be comfortable.  He has not having much diarrhea from what I can tell.  I do not think he is having any pain.  His vital signs all look pretty stable.  Again, we just have to await a bed for him.  I am not sure when a bed will open up.  However, he has not gone to go anywhere until a bed opens up for him.  His comfort and dignity is what is necessary.  He will get this from the staff on 6 E.  [name], MD'   Galatians 6:10

## 2020-07-15 NOTE — Progress Notes (Signed)
Patient picked up for transport to the [facility]. Patient's wife notified by phone of patient's transfer and discharge.

## 2020-07-21 ENCOUNTER — Ambulatory Visit: Payer: PPO

## 2020-07-21 ENCOUNTER — Other Ambulatory Visit: Payer: PPO

## 2020-07-21 ENCOUNTER — Ambulatory Visit: Payer: PPO | Admitting: Hematology & Oncology

## 2020-07-22 ENCOUNTER — Telehealth: Payer: Self-pay | Admitting: *Deleted

## 2020-07-22 NOTE — Telephone Encounter (Signed)
Received call from wife that patient passed away last week, she wanted for Dr Debara Pickett and Eliezer Lofts to know Will forward to Dr Debara Pickett and Eliezer Lofts RN

## 2020-07-22 NOTE — Telephone Encounter (Signed)
Sorry to hear that -thanks for letting me know.  Dr Lemmie Evens

## 2020-07-23 ENCOUNTER — Telehealth: Payer: Self-pay | Admitting: Family Medicine

## 2020-07-23 NOTE — Telephone Encounter (Signed)
Per Mechele Claude with [facility] pt passed away 2020-08-07

## 2020-07-30 ENCOUNTER — Telehealth: Payer: Self-pay | Admitting: *Deleted

## 2020-07-30 ENCOUNTER — Telehealth: Payer: Self-pay

## 2020-07-30 NOTE — Telephone Encounter (Signed)
Opened patient's chart per MD to contact wife to check on patient to find patient passed away. Per obituary pt passed away on 08-09-2020 at [facility]. This office was not formally notified. Flowers sent to wife per Dr Marin Olp. dph

## 2020-07-30 NOTE — Telephone Encounter (Signed)
Call to Hospice home at [facility] concerning pt. Pt passed 08-01-2020 at approximately. 345am.MD notified. Message to HIM to close chart.

## 2020-07-30 NOTE — Telephone Encounter (Signed)
[name] Rose Director at [facility] advised pt admitted, discharge ppwk did not list his team of physicians. [name] Rose apologized for not informing our office of pts death. Death Certificate is also signed by the hospice Physician. Message given to [name]

## 2020-08-10 DEATH — deceased

## 2020-08-27 ENCOUNTER — Ambulatory Visit: Payer: PPO | Admitting: Internal Medicine

## 2021-08-11 ENCOUNTER — Other Ambulatory Visit: Payer: Self-pay | Admitting: Pharmacist
